# Patient Record
Sex: Female | Born: 1950 | Race: Black or African American | Hispanic: No | Smoking: Former smoker
Health system: Southern US, Community
[De-identification: ages and names within clinical notes are randomized; demographics above are authoritative.]

## PROBLEM LIST (undated history)

## (undated) DIAGNOSIS — G5621 Lesion of ulnar nerve, right upper limb: Secondary | ICD-10-CM

## (undated) DIAGNOSIS — H9190 Unspecified hearing loss, unspecified ear: Secondary | ICD-10-CM

## (undated) DIAGNOSIS — K219 Gastro-esophageal reflux disease without esophagitis: Secondary | ICD-10-CM

## (undated) DIAGNOSIS — C50919 Malignant neoplasm of unspecified site of unspecified female breast: Secondary | ICD-10-CM

## (undated) DIAGNOSIS — R0600 Dyspnea, unspecified: Secondary | ICD-10-CM

## (undated) DIAGNOSIS — K8020 Calculus of gallbladder without cholecystitis without obstruction: Secondary | ICD-10-CM

## (undated) DIAGNOSIS — R1310 Dysphagia, unspecified: Secondary | ICD-10-CM

## (undated) DIAGNOSIS — G5603 Carpal tunnel syndrome, bilateral upper limbs: Secondary | ICD-10-CM

## (undated) DIAGNOSIS — Z972 Presence of dental prosthetic device (complete) (partial): Secondary | ICD-10-CM

## (undated) DIAGNOSIS — R51 Headache: Secondary | ICD-10-CM

## (undated) DIAGNOSIS — J449 Chronic obstructive pulmonary disease, unspecified: Secondary | ICD-10-CM

## (undated) DIAGNOSIS — D509 Iron deficiency anemia, unspecified: Secondary | ICD-10-CM

## (undated) DIAGNOSIS — Z9989 Dependence on other enabling machines and devices: Secondary | ICD-10-CM

## (undated) DIAGNOSIS — E785 Hyperlipidemia, unspecified: Secondary | ICD-10-CM

## (undated) DIAGNOSIS — I219 Acute myocardial infarction, unspecified: Secondary | ICD-10-CM

## (undated) DIAGNOSIS — Z973 Presence of spectacles and contact lenses: Secondary | ICD-10-CM

## (undated) DIAGNOSIS — K760 Fatty (change of) liver, not elsewhere classified: Secondary | ICD-10-CM

## (undated) DIAGNOSIS — F1410 Cocaine abuse, uncomplicated: Secondary | ICD-10-CM

## (undated) DIAGNOSIS — C801 Malignant (primary) neoplasm, unspecified: Secondary | ICD-10-CM

## (undated) DIAGNOSIS — Z86010 Personal history of colonic polyps: Secondary | ICD-10-CM

## (undated) DIAGNOSIS — I209 Angina pectoris, unspecified: Secondary | ICD-10-CM

## (undated) DIAGNOSIS — I2510 Atherosclerotic heart disease of native coronary artery without angina pectoris: Secondary | ICD-10-CM

## (undated) DIAGNOSIS — R519 Headache, unspecified: Secondary | ICD-10-CM

## (undated) DIAGNOSIS — M1990 Unspecified osteoarthritis, unspecified site: Secondary | ICD-10-CM

## (undated) DIAGNOSIS — R112 Nausea with vomiting, unspecified: Secondary | ICD-10-CM

## (undated) DIAGNOSIS — I10 Essential (primary) hypertension: Secondary | ICD-10-CM

## (undated) HISTORY — DX: Acute myocardial infarction, unspecified: I21.9

## (undated) HISTORY — PX: EYE SURGERY: SHX253

## (undated) HISTORY — DX: Atherosclerotic heart disease of native coronary artery without angina pectoris: I25.10

## (undated) HISTORY — DX: Hyperlipidemia, unspecified: E78.5

## (undated) HISTORY — DX: Carpal tunnel syndrome, bilateral upper limbs: G56.03

## (undated) HISTORY — PX: BREAST BIOPSY: SHX20

## (undated) HISTORY — DX: Gastro-esophageal reflux disease without esophagitis: K21.9

## (undated) HISTORY — DX: Personal history of colonic polyps: Z86.010

## (undated) HISTORY — PX: CORONARY ANGIOPLASTY WITH STENT PLACEMENT: SHX49

## (undated) HISTORY — DX: Essential (primary) hypertension: I10

## (undated) HISTORY — PX: TUBAL LIGATION: SHX77

## (undated) HISTORY — DX: Calculus of gallbladder without cholecystitis without obstruction: K80.20

## (undated) HISTORY — DX: Fatty (change of) liver, not elsewhere classified: K76.0

## (undated) HISTORY — DX: Cocaine abuse, uncomplicated: F14.10

## (undated) HISTORY — DX: Lesion of ulnar nerve, right upper limb: G56.21

## (undated) HISTORY — DX: Iron deficiency anemia, unspecified: D50.9

## (undated) HISTORY — PX: CATARACT EXTRACTION: SUR2

---

## 1997-06-09 ENCOUNTER — Other Ambulatory Visit: Admission: RE | Admit: 1997-06-09 | Discharge: 1997-06-09 | Payer: Self-pay | Admitting: Family Medicine

## 1997-06-17 ENCOUNTER — Ambulatory Visit (HOSPITAL_COMMUNITY): Admission: RE | Admit: 1997-06-17 | Discharge: 1997-06-17 | Payer: Self-pay | Admitting: Family Medicine

## 1997-08-04 ENCOUNTER — Other Ambulatory Visit: Admission: RE | Admit: 1997-08-04 | Discharge: 1997-08-04 | Payer: Self-pay | Admitting: Family Medicine

## 1999-03-01 ENCOUNTER — Other Ambulatory Visit: Admission: RE | Admit: 1999-03-01 | Discharge: 1999-03-01 | Payer: Self-pay | Admitting: Internal Medicine

## 1999-03-10 ENCOUNTER — Ambulatory Visit (HOSPITAL_COMMUNITY): Admission: RE | Admit: 1999-03-10 | Discharge: 1999-03-10 | Payer: Self-pay | Admitting: *Deleted

## 2001-12-29 ENCOUNTER — Encounter: Payer: Self-pay | Admitting: Cardiology

## 2001-12-29 ENCOUNTER — Ambulatory Visit (HOSPITAL_COMMUNITY): Admission: RE | Admit: 2001-12-29 | Discharge: 2001-12-29 | Payer: Self-pay | Admitting: Cardiology

## 2002-07-23 ENCOUNTER — Ambulatory Visit (HOSPITAL_COMMUNITY): Admission: RE | Admit: 2002-07-23 | Discharge: 2002-07-24 | Payer: Self-pay | Admitting: Cardiology

## 2004-01-11 ENCOUNTER — Inpatient Hospital Stay (HOSPITAL_BASED_OUTPATIENT_CLINIC_OR_DEPARTMENT_OTHER): Admission: RE | Admit: 2004-01-11 | Discharge: 2004-01-11 | Payer: Self-pay | Admitting: Cardiology

## 2004-10-23 ENCOUNTER — Ambulatory Visit (HOSPITAL_COMMUNITY): Admission: RE | Admit: 2004-10-23 | Discharge: 2004-10-23 | Payer: Self-pay | Admitting: Cardiology

## 2004-12-02 ENCOUNTER — Emergency Department (HOSPITAL_COMMUNITY): Admission: EM | Admit: 2004-12-02 | Discharge: 2004-12-02 | Payer: Self-pay | Admitting: Family Medicine

## 2005-02-25 ENCOUNTER — Inpatient Hospital Stay (HOSPITAL_COMMUNITY): Admission: EM | Admit: 2005-02-25 | Discharge: 2005-03-01 | Payer: Self-pay | Admitting: Emergency Medicine

## 2005-03-09 ENCOUNTER — Emergency Department (HOSPITAL_COMMUNITY): Admission: EM | Admit: 2005-03-09 | Discharge: 2005-03-09 | Payer: Self-pay | Admitting: Emergency Medicine

## 2005-03-27 ENCOUNTER — Emergency Department (HOSPITAL_COMMUNITY): Admission: EM | Admit: 2005-03-27 | Discharge: 2005-03-27 | Payer: Self-pay | Admitting: Emergency Medicine

## 2006-02-20 ENCOUNTER — Ambulatory Visit (HOSPITAL_COMMUNITY): Admission: RE | Admit: 2006-02-20 | Discharge: 2006-02-20 | Payer: Self-pay | Admitting: Cardiology

## 2006-04-30 ENCOUNTER — Emergency Department (HOSPITAL_COMMUNITY): Admission: EM | Admit: 2006-04-30 | Discharge: 2006-04-30 | Payer: Self-pay | Admitting: Emergency Medicine

## 2006-05-06 ENCOUNTER — Inpatient Hospital Stay (HOSPITAL_COMMUNITY): Admission: EM | Admit: 2006-05-06 | Discharge: 2006-05-09 | Payer: Self-pay | Admitting: Emergency Medicine

## 2006-09-12 ENCOUNTER — Encounter (HOSPITAL_COMMUNITY): Admission: RE | Admit: 2006-09-12 | Discharge: 2006-12-11 | Payer: Self-pay | Admitting: Cardiology

## 2006-09-16 ENCOUNTER — Inpatient Hospital Stay (HOSPITAL_COMMUNITY): Admission: EM | Admit: 2006-09-16 | Discharge: 2006-09-19 | Payer: Self-pay | Admitting: Emergency Medicine

## 2006-12-12 ENCOUNTER — Encounter (HOSPITAL_COMMUNITY): Admission: RE | Admit: 2006-12-12 | Discharge: 2007-01-06 | Payer: Self-pay | Admitting: Cardiology

## 2007-01-07 ENCOUNTER — Encounter (HOSPITAL_COMMUNITY): Admission: RE | Admit: 2007-01-07 | Discharge: 2007-02-06 | Payer: Self-pay | Admitting: Cardiology

## 2007-02-06 ENCOUNTER — Encounter (HOSPITAL_COMMUNITY): Admission: RE | Admit: 2007-02-06 | Discharge: 2007-03-07 | Payer: Self-pay | Admitting: Cardiology

## 2007-02-06 DIAGNOSIS — K760 Fatty (change of) liver, not elsewhere classified: Secondary | ICD-10-CM

## 2007-02-06 DIAGNOSIS — Z860100 Personal history of colon polyps, unspecified: Secondary | ICD-10-CM

## 2007-02-06 HISTORY — DX: Personal history of colonic polyps: Z86.010

## 2007-02-06 HISTORY — DX: Personal history of colon polyps, unspecified: Z86.0100

## 2007-02-06 HISTORY — DX: Fatty (change of) liver, not elsewhere classified: K76.0

## 2007-02-06 HISTORY — PX: RIGHT COLECTOMY: SHX853

## 2007-02-17 ENCOUNTER — Inpatient Hospital Stay (HOSPITAL_COMMUNITY): Admission: EM | Admit: 2007-02-17 | Discharge: 2007-03-14 | Payer: Self-pay | Admitting: Emergency Medicine

## 2007-02-18 ENCOUNTER — Encounter: Payer: Self-pay | Admitting: Internal Medicine

## 2007-02-20 ENCOUNTER — Encounter (INDEPENDENT_AMBULATORY_CARE_PROVIDER_SITE_OTHER): Payer: Self-pay | Admitting: Gastroenterology

## 2007-02-20 ENCOUNTER — Encounter (INDEPENDENT_AMBULATORY_CARE_PROVIDER_SITE_OTHER): Payer: Self-pay | Admitting: *Deleted

## 2007-02-20 ENCOUNTER — Encounter: Payer: Self-pay | Admitting: Internal Medicine

## 2007-02-22 ENCOUNTER — Encounter (INDEPENDENT_AMBULATORY_CARE_PROVIDER_SITE_OTHER): Payer: Self-pay | Admitting: *Deleted

## 2007-02-22 ENCOUNTER — Encounter (INDEPENDENT_AMBULATORY_CARE_PROVIDER_SITE_OTHER): Payer: Self-pay | Admitting: General Surgery

## 2007-03-03 ENCOUNTER — Ambulatory Visit: Payer: Self-pay | Admitting: Gastroenterology

## 2007-03-05 ENCOUNTER — Encounter (INDEPENDENT_AMBULATORY_CARE_PROVIDER_SITE_OTHER): Payer: Self-pay | Admitting: *Deleted

## 2007-03-07 ENCOUNTER — Encounter (INDEPENDENT_AMBULATORY_CARE_PROVIDER_SITE_OTHER): Payer: Self-pay | Admitting: *Deleted

## 2007-03-08 ENCOUNTER — Encounter (INDEPENDENT_AMBULATORY_CARE_PROVIDER_SITE_OTHER): Payer: Self-pay | Admitting: *Deleted

## 2007-03-10 ENCOUNTER — Encounter (INDEPENDENT_AMBULATORY_CARE_PROVIDER_SITE_OTHER): Payer: Self-pay | Admitting: *Deleted

## 2007-05-28 ENCOUNTER — Emergency Department (HOSPITAL_COMMUNITY): Admission: EM | Admit: 2007-05-28 | Discharge: 2007-05-28 | Payer: Self-pay | Admitting: Emergency Medicine

## 2007-06-01 ENCOUNTER — Inpatient Hospital Stay (HOSPITAL_COMMUNITY): Admission: EM | Admit: 2007-06-01 | Discharge: 2007-06-04 | Payer: Self-pay | Admitting: Emergency Medicine

## 2007-06-19 ENCOUNTER — Encounter (HOSPITAL_COMMUNITY): Admission: RE | Admit: 2007-06-19 | Discharge: 2007-09-17 | Payer: Self-pay | Admitting: Cardiology

## 2007-08-26 ENCOUNTER — Inpatient Hospital Stay (HOSPITAL_COMMUNITY): Admission: EM | Admit: 2007-08-26 | Discharge: 2007-08-29 | Payer: Self-pay | Admitting: Emergency Medicine

## 2007-11-06 IMAGING — CR DG CHEST 1V PORT
1 series · 1 of 1 positions shown · non-contrast
Comparison: None.

CLINICAL DATA: Chest pain.
 PORTABLE CHEST - 1 VIEW:

[view not recorded]
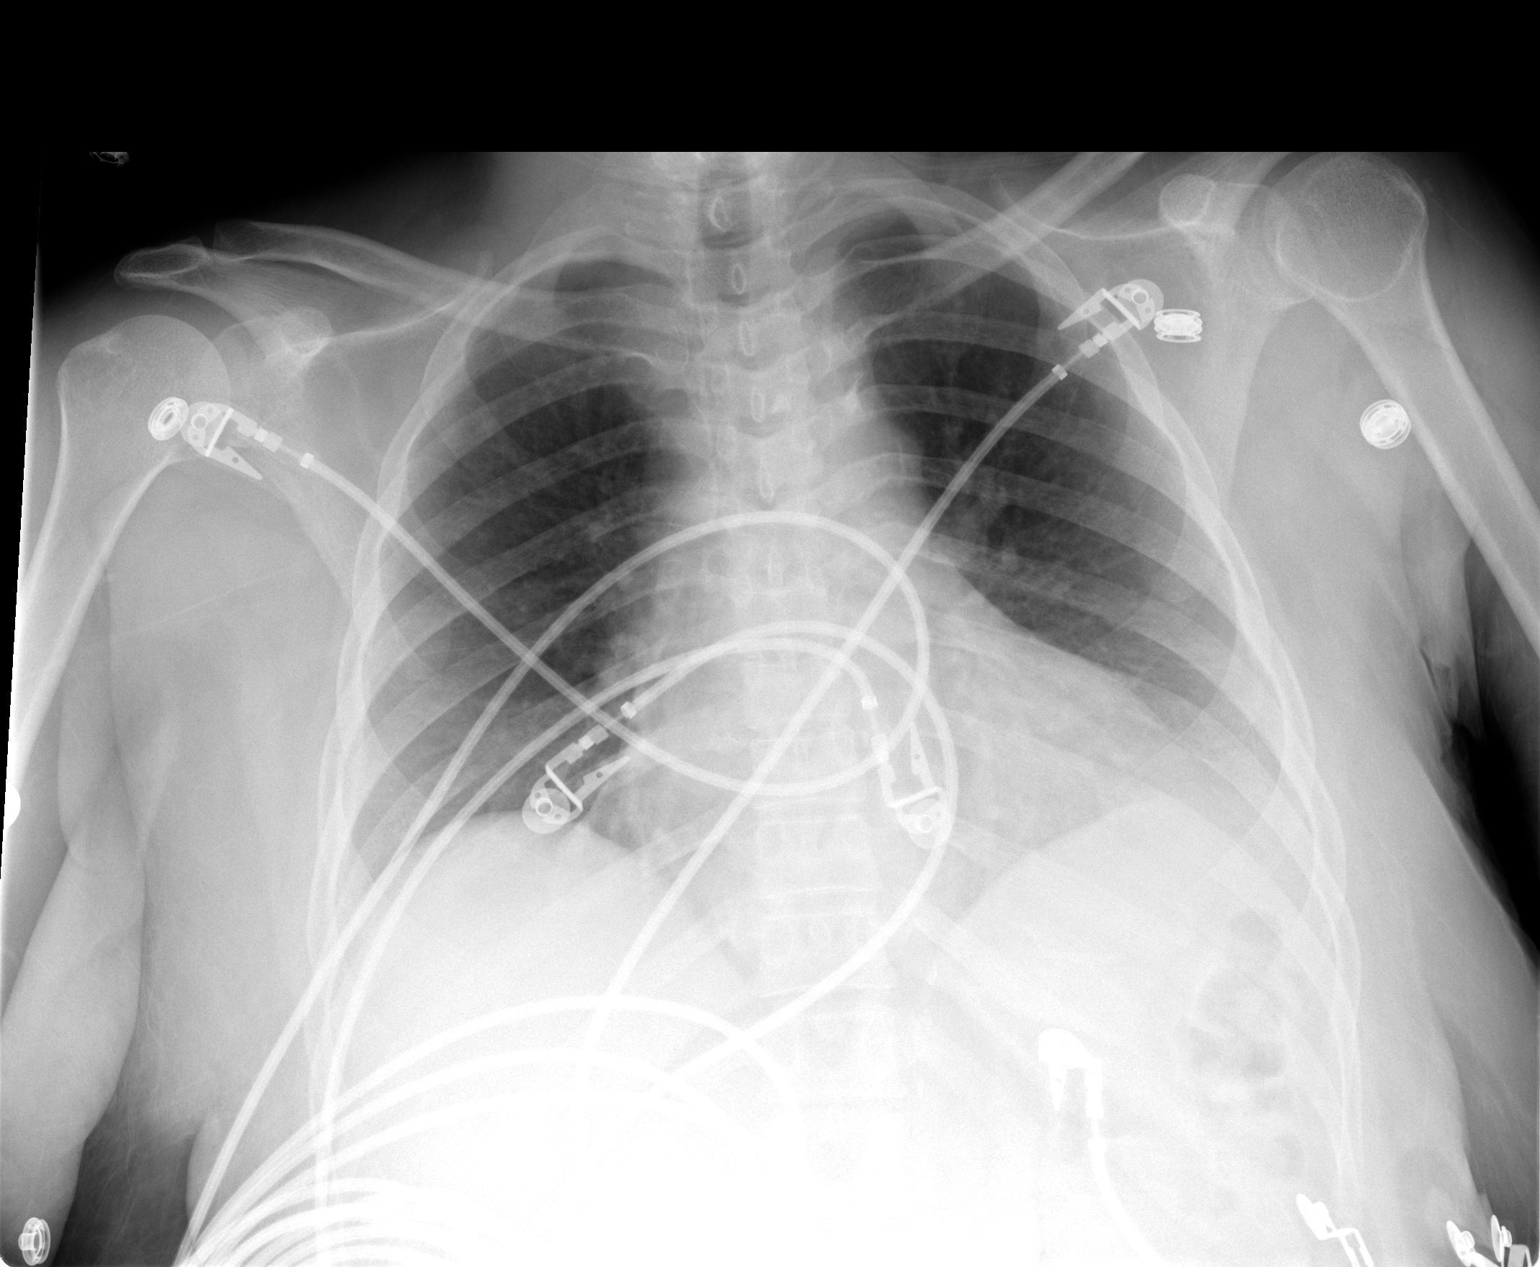

[1 of 1 positions shown; findings below may reference images not displayed]

AP film at 3299 hours shows low volumes with cardiomegaly.  No edema.  Left hemidiaphragm appears obscured laterally, raising the question of atelectasis or infiltrate.  Telemetry leads overlie the chest.
IMPRESSION: 1.  Cardiomegaly without edema.
 2.  Probable atelectasis or infiltrate at the left base.  Two-view chest x-ray would be helpful to further evaluate as indicated.

## 2007-11-06 IMAGING — CR DG CHEST 2V
2 series · 2 of 2 positions shown · non-contrast
Comparison: 02/25/05.

CLINICAL DATA: Chest pain, short of breath. 
 CHEST ? 2 VIEW:

[w chest pa]
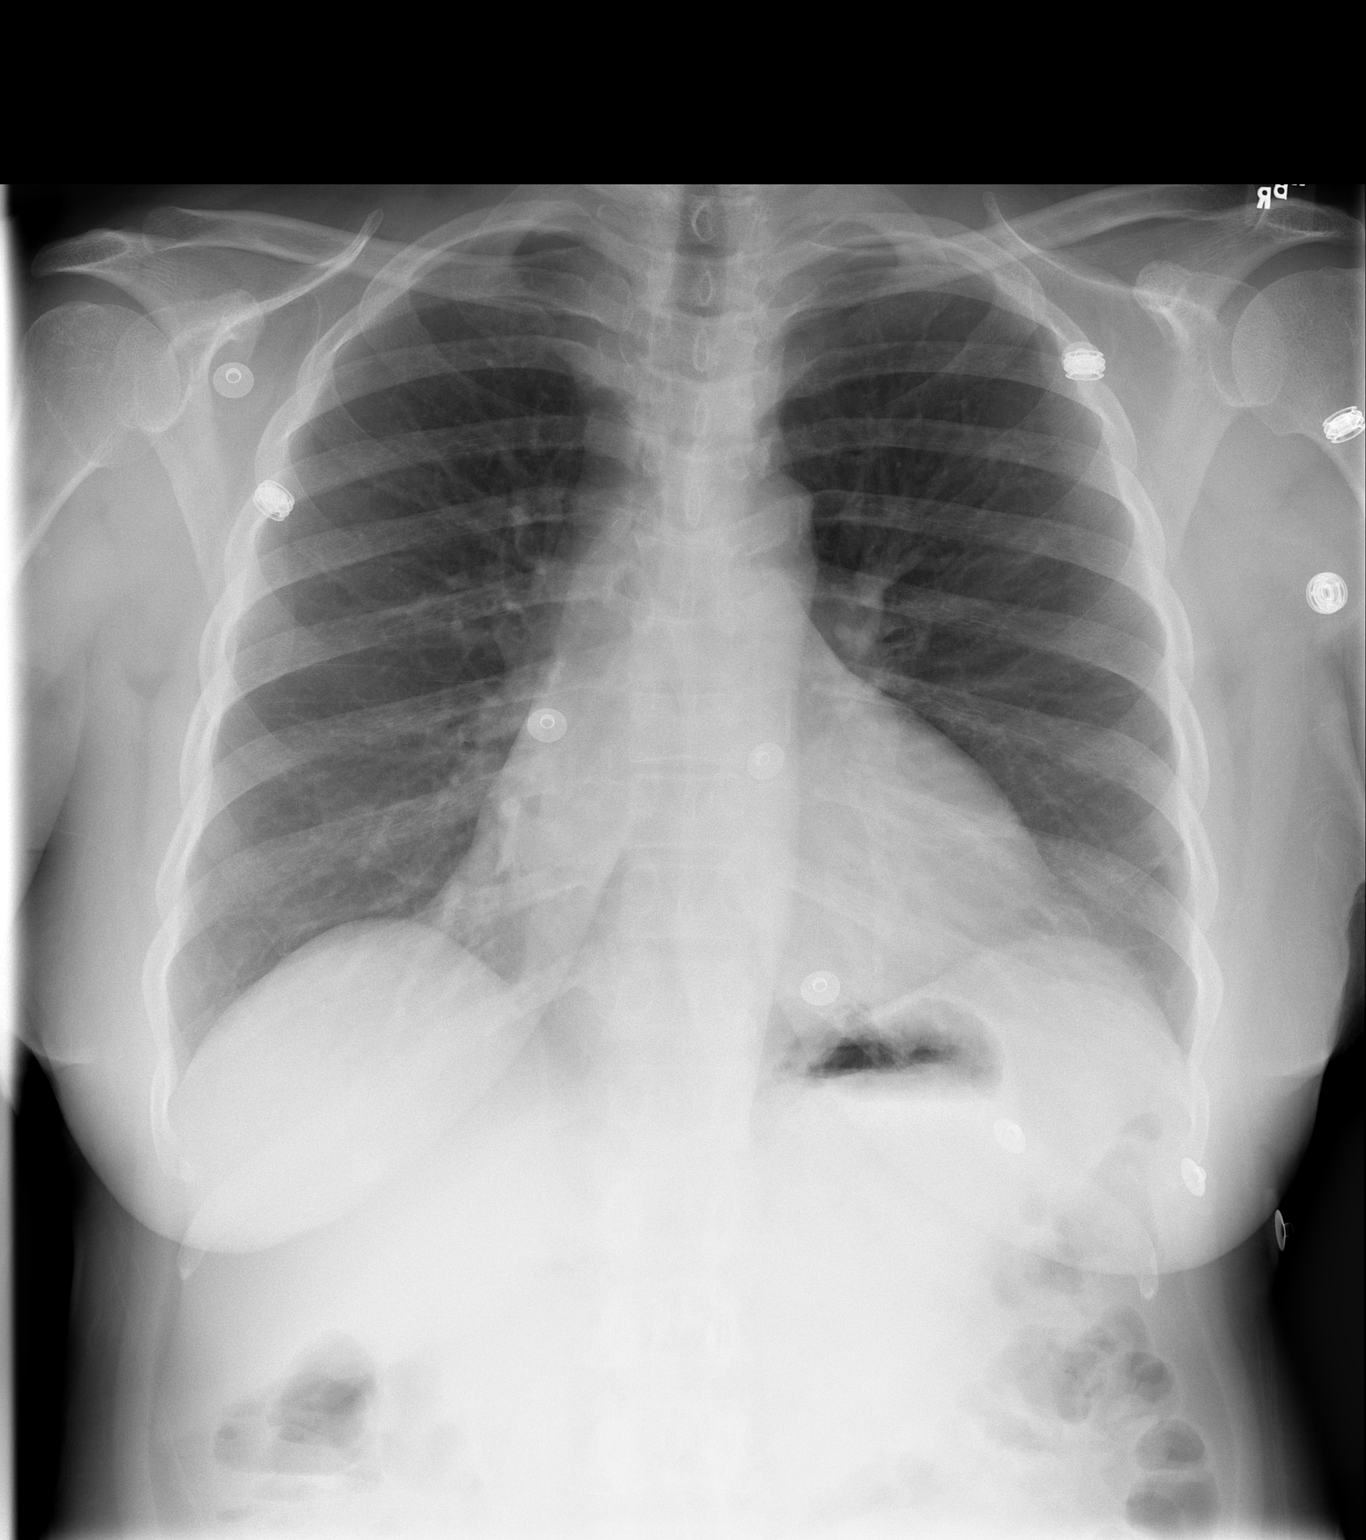

[w chest lat]
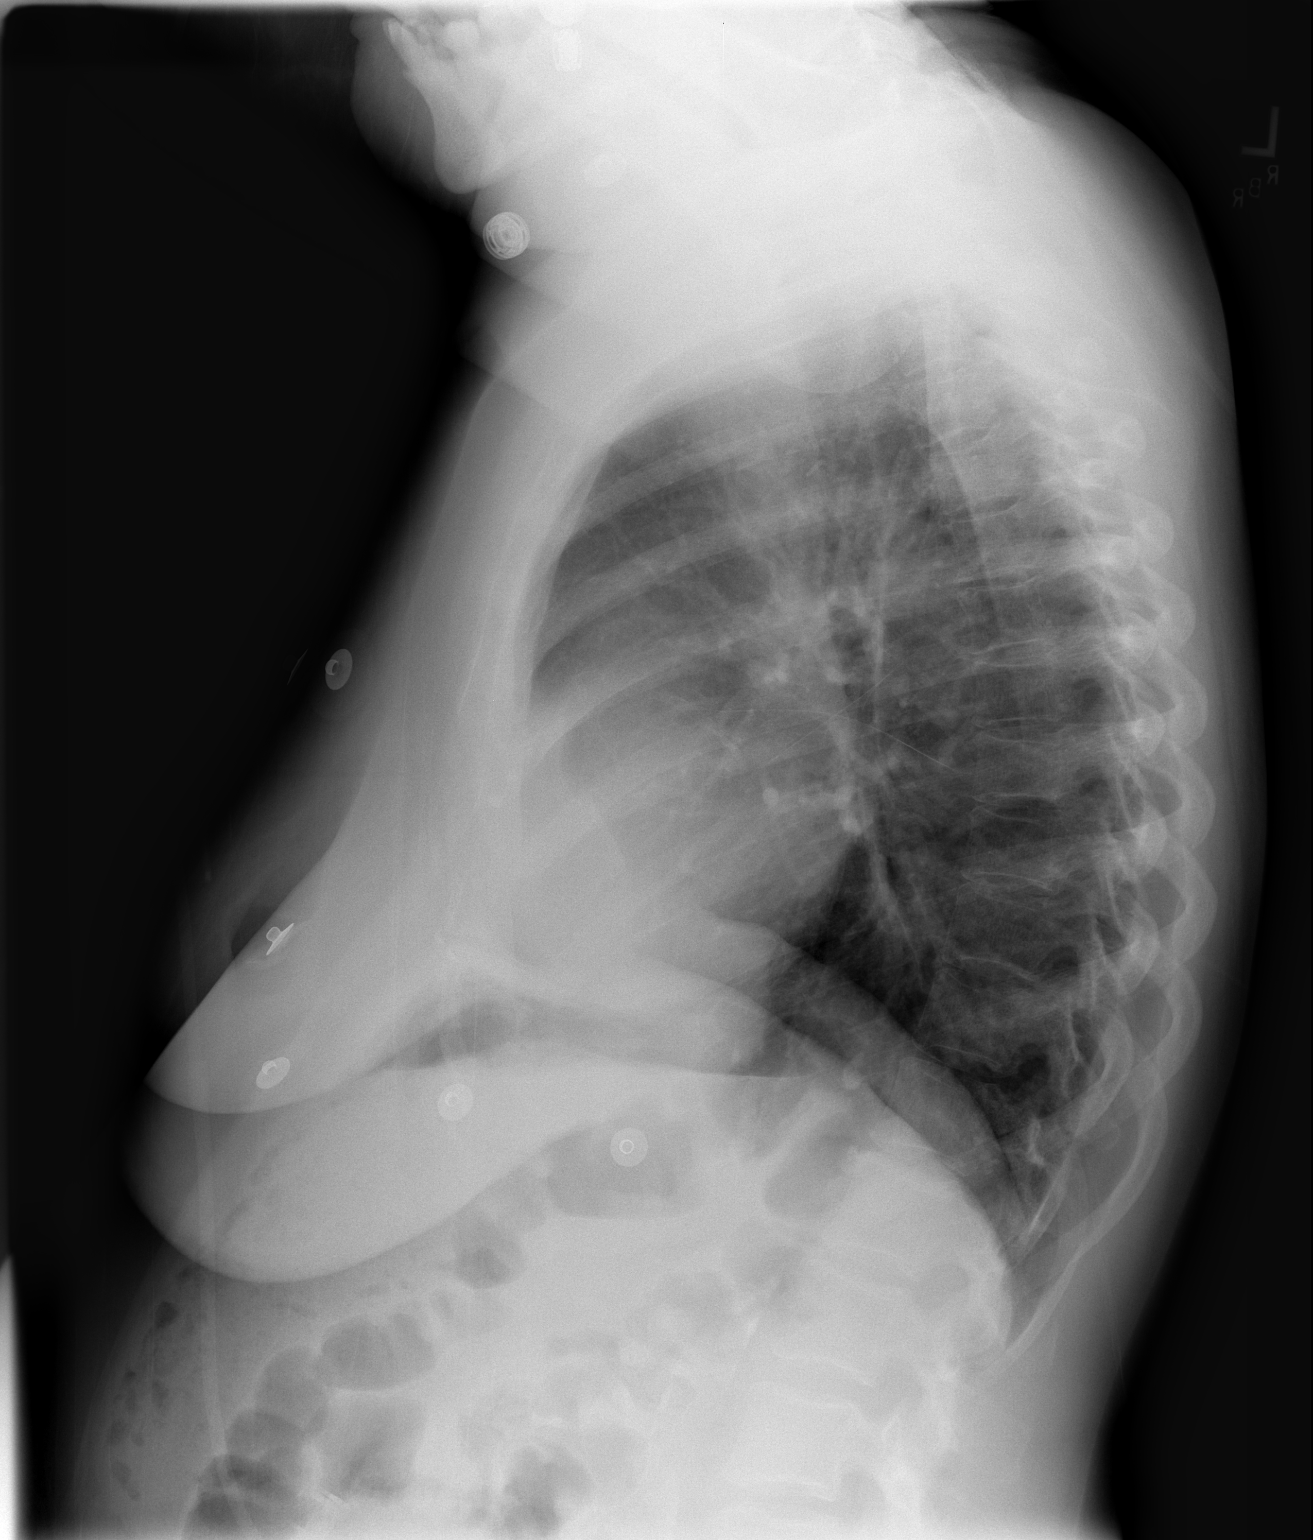

[2 of 2 positions shown; findings below may reference images not displayed]

FINDINGS: Two views of the chest show stable cardiomegaly.  The opacity at the left base has cleared.  No active process is seen.  No bony abnormality is noted.
IMPRESSION: No active lung disease.  Cardiomegaly.

## 2007-11-17 ENCOUNTER — Ambulatory Visit: Payer: Self-pay | Admitting: Internal Medicine

## 2008-01-15 ENCOUNTER — Ambulatory Visit: Payer: Self-pay | Admitting: Internal Medicine

## 2008-01-16 ENCOUNTER — Ambulatory Visit (HOSPITAL_COMMUNITY): Admission: RE | Admit: 2008-01-16 | Discharge: 2008-01-16 | Payer: Self-pay | Admitting: Internal Medicine

## 2008-02-04 ENCOUNTER — Encounter (HOSPITAL_COMMUNITY): Admission: RE | Admit: 2008-02-04 | Discharge: 2008-02-05 | Payer: Self-pay | Admitting: Cardiology

## 2008-02-06 ENCOUNTER — Encounter (HOSPITAL_COMMUNITY): Admission: RE | Admit: 2008-02-06 | Discharge: 2008-02-06 | Payer: Self-pay | Admitting: *Deleted

## 2008-09-24 ENCOUNTER — Encounter (INDEPENDENT_AMBULATORY_CARE_PROVIDER_SITE_OTHER): Payer: Self-pay | Admitting: Adult Health

## 2008-09-24 ENCOUNTER — Ambulatory Visit: Payer: Self-pay | Admitting: Internal Medicine

## 2008-09-24 LAB — CONVERTED CEMR LAB
ALT: 71 units/L — ABNORMAL HIGH (ref 0–35)
BUN: 8 mg/dL (ref 6–23)
Basophils Relative: 0 % (ref 0–1)
CO2: 25 meq/L (ref 19–32)
Calcium: 9.6 mg/dL (ref 8.4–10.5)
Cholesterol: 111 mg/dL (ref 0–200)
Creatinine, Ser: 1.01 mg/dL (ref 0.40–1.20)
Eosinophils Absolute: 0.1 10*3/uL (ref 0.0–0.7)
Eosinophils Relative: 2 % (ref 0–5)
Glucose, Bld: 108 mg/dL — ABNORMAL HIGH (ref 70–99)
HCT: 38.6 % (ref 36.0–46.0)
HDL: 37 mg/dL — ABNORMAL LOW (ref >39)
MCHC: 30.3 g/dL (ref 30.0–36.0)
MCV: 96.5 fL (ref 78.0–100.0)
Monocytes Relative: 11 % (ref 3–12)
Neutrophils Relative %: 48 % (ref 43–77)
RBC: 4 M/uL (ref 3.87–5.11)
Total Bilirubin: 0.5 mg/dL (ref 0.3–1.2)
Total CHOL/HDL Ratio: 3
Triglycerides: 319 mg/dL — ABNORMAL HIGH (ref <150)
VLDL: 64 mg/dL — ABNORMAL HIGH (ref 0–40)

## 2008-09-28 ENCOUNTER — Encounter (INDEPENDENT_AMBULATORY_CARE_PROVIDER_SITE_OTHER): Payer: Self-pay | Admitting: Adult Health

## 2008-09-28 LAB — CONVERTED CEMR LAB
HCV Ab: NEGATIVE
Hep A IgM: NEGATIVE

## 2008-10-13 ENCOUNTER — Ambulatory Visit (HOSPITAL_COMMUNITY): Admission: RE | Admit: 2008-10-13 | Discharge: 2008-10-13 | Payer: Self-pay | Admitting: Internal Medicine

## 2008-10-13 ENCOUNTER — Ambulatory Visit (HOSPITAL_COMMUNITY): Admission: RE | Admit: 2008-10-13 | Discharge: 2008-10-13 | Payer: Self-pay | Admitting: Adult Health

## 2008-10-16 ENCOUNTER — Inpatient Hospital Stay (HOSPITAL_COMMUNITY): Admission: EM | Admit: 2008-10-16 | Discharge: 2008-10-19 | Payer: Self-pay | Admitting: Emergency Medicine

## 2008-10-19 ENCOUNTER — Encounter (INDEPENDENT_AMBULATORY_CARE_PROVIDER_SITE_OTHER): Payer: Self-pay | Admitting: *Deleted

## 2008-10-25 ENCOUNTER — Encounter (INDEPENDENT_AMBULATORY_CARE_PROVIDER_SITE_OTHER): Payer: Self-pay | Admitting: Adult Health

## 2008-10-25 ENCOUNTER — Ambulatory Visit: Payer: Self-pay | Admitting: Internal Medicine

## 2008-10-25 LAB — CONVERTED CEMR LAB
ALT: 16 units/L (ref 0–35)
AST: 13 units/L (ref 0–37)
CO2: 25 meq/L (ref 19–32)
Calcium: 9.3 mg/dL (ref 8.4–10.5)
Chloride: 107 meq/L (ref 96–112)
Potassium: 3.7 meq/L (ref 3.5–5.3)
Sodium: 144 meq/L (ref 135–145)
Total Protein: 7.9 g/dL (ref 6.0–8.3)

## 2008-11-25 ENCOUNTER — Ambulatory Visit: Payer: Self-pay | Admitting: Internal Medicine

## 2009-01-08 IMAGING — CR DG CHEST 1V PORT
1 series · 1 of 1 positions shown · non-contrast
Comparison: 02/25/05.

CLINICAL DATA: Short of breath. Chest pain.  
 PORTABLE CHEST - 1 VIEW, 04/30/06, 5334 HOURS:

[view not recorded]
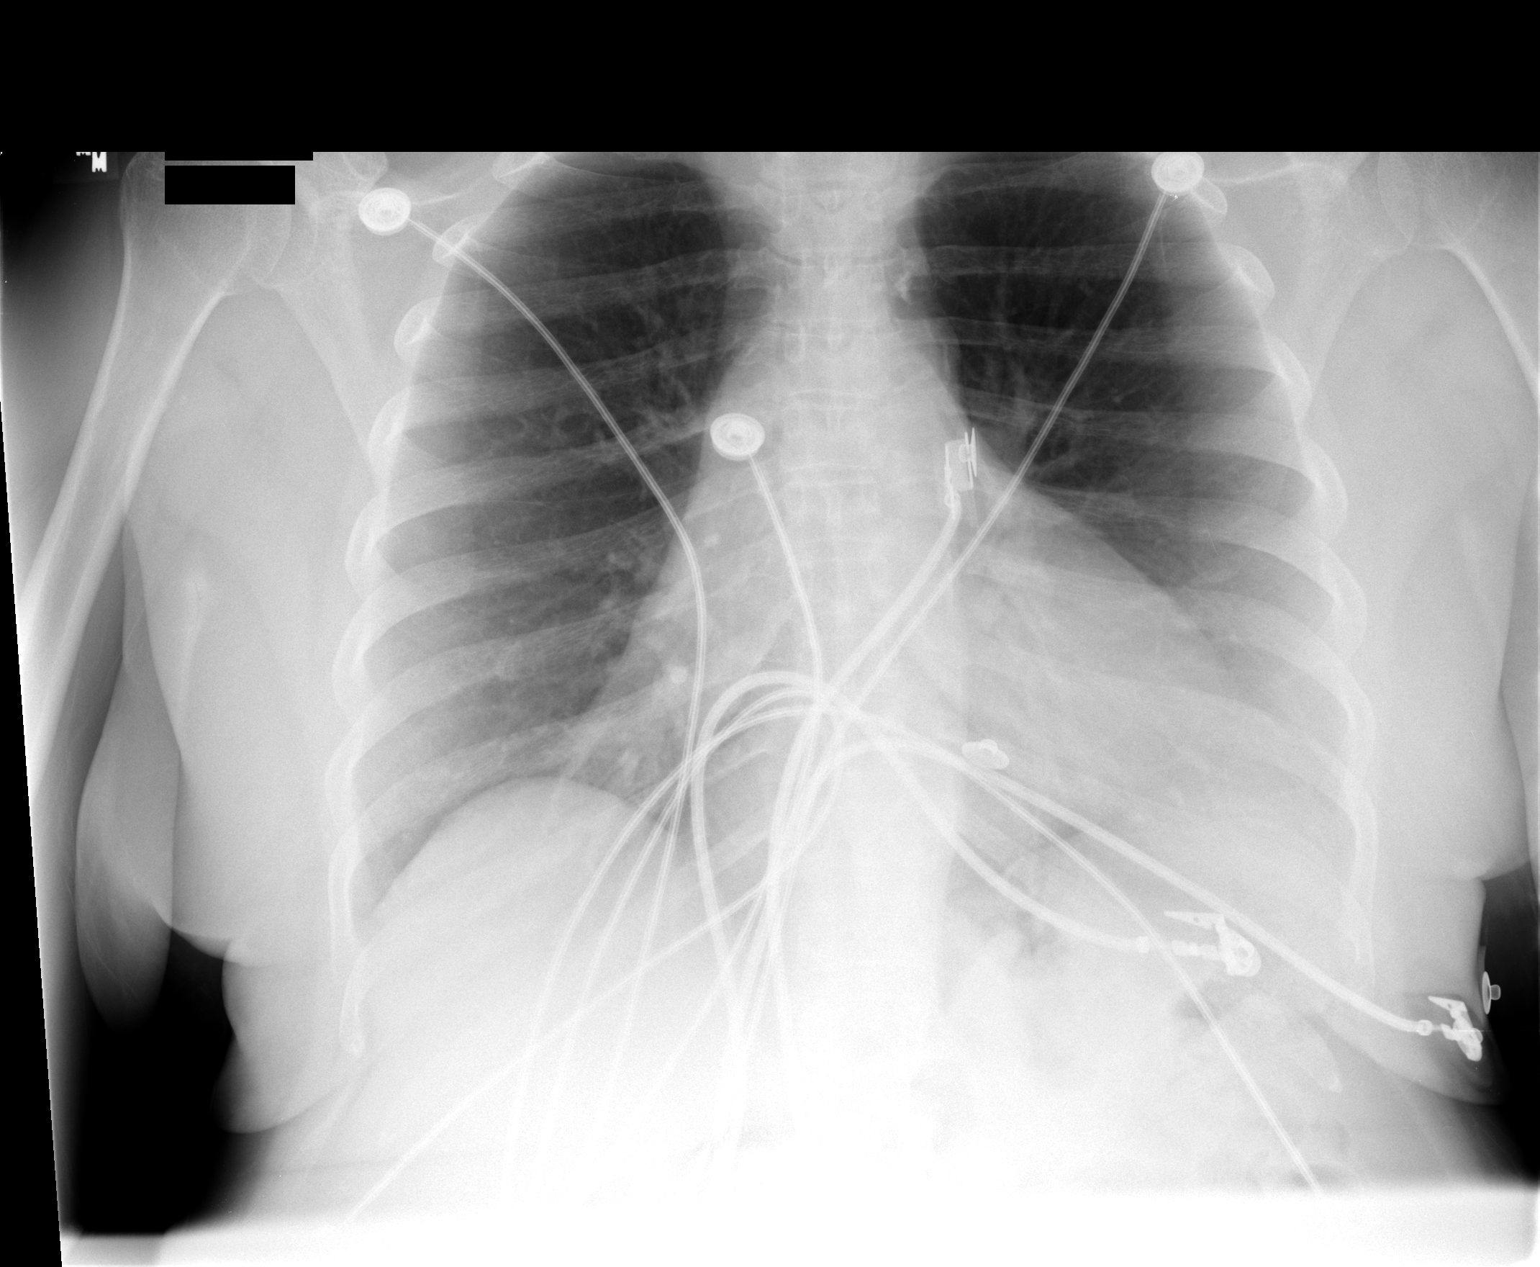

[1 of 1 positions shown; findings below may reference images not displayed]

FINDINGS: Heart is mildly enlarged.  Vascularity is normal.  Lungs are clear.
IMPRESSION: No acute abnormality.

## 2009-01-14 IMAGING — CR DG CHEST 1V PORT
1 series · 1 of 1 positions shown · non-contrast
Comparison: 04/30/06.

CLINICAL DATA: Chest pain.  SOB.  Smoker. 
 PORTABLE CHEST ? 1 VIEW:

[view not recorded]
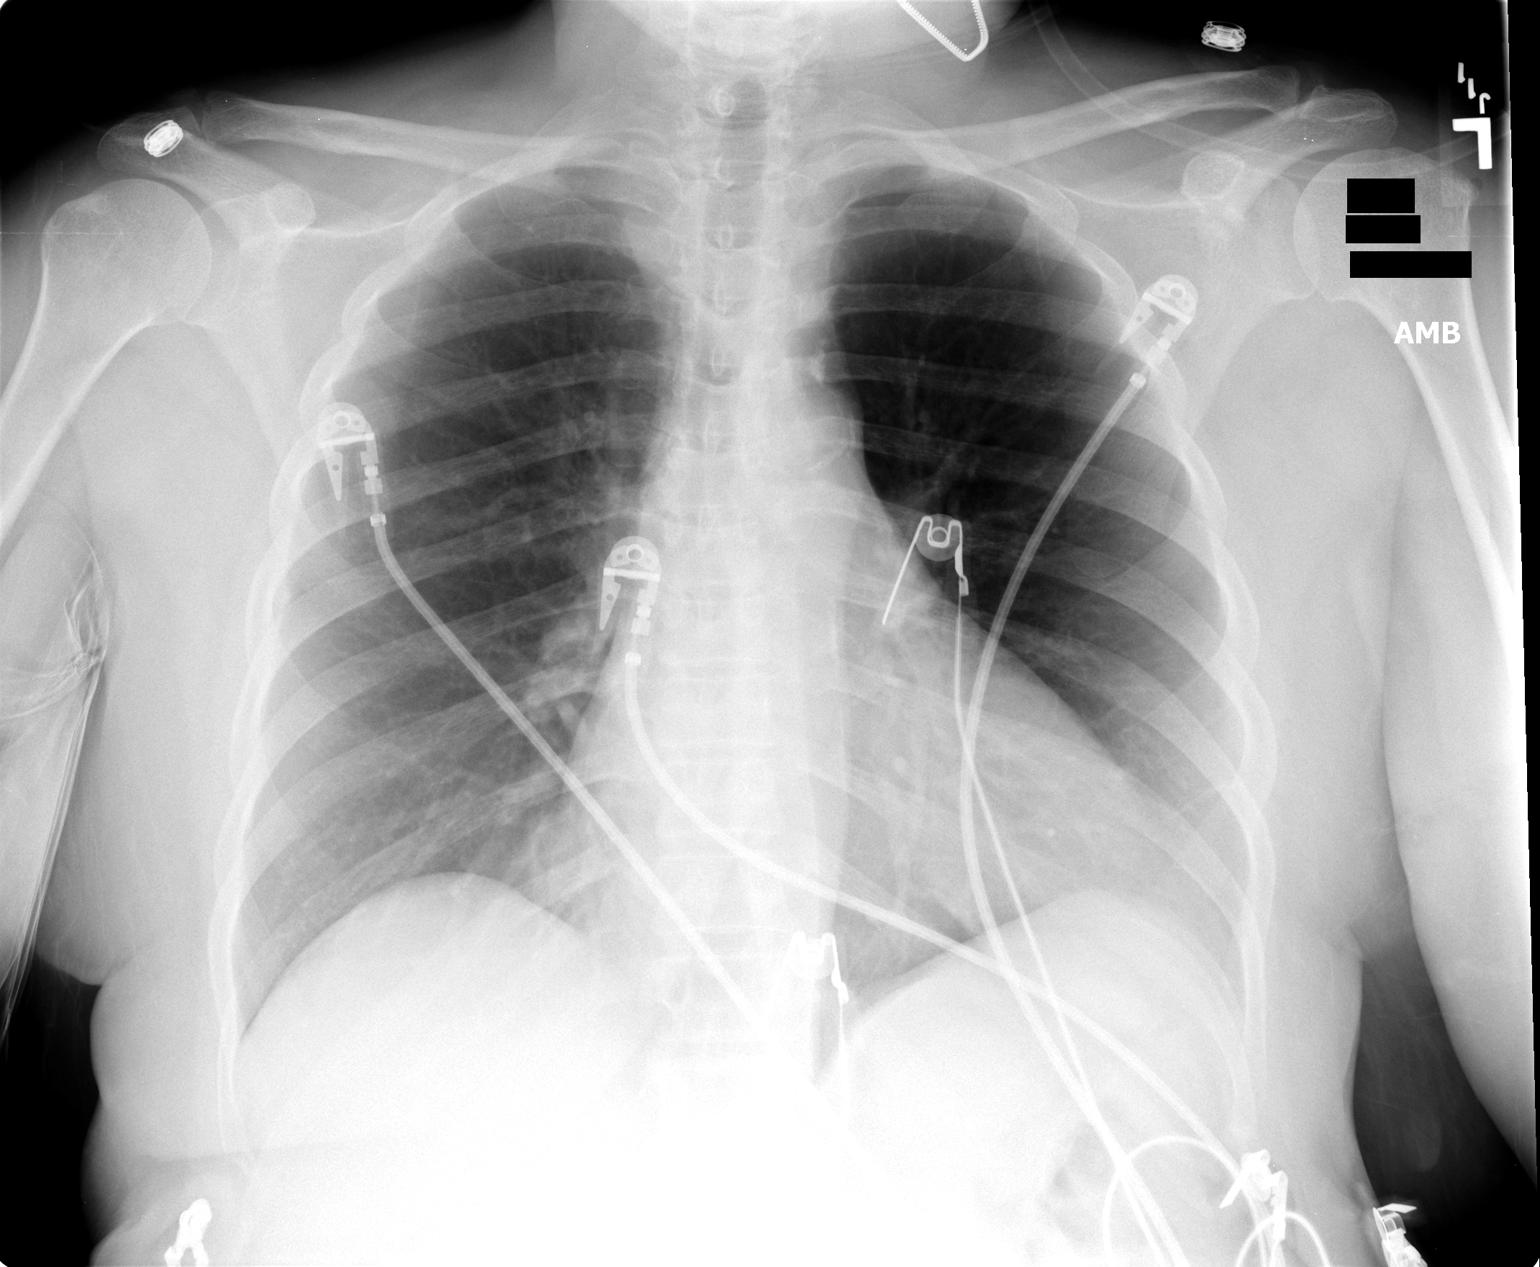

[1 of 1 positions shown; findings below may reference images not displayed]

FINDINGS: Heart size normal.  No effusions or edema.  No airspace opacities are identified.
IMPRESSION: No active cardiopulmonary disease.

## 2009-02-13 ENCOUNTER — Emergency Department (HOSPITAL_COMMUNITY): Admission: EM | Admit: 2009-02-13 | Discharge: 2009-02-13 | Payer: Self-pay | Admitting: Emergency Medicine

## 2009-02-22 ENCOUNTER — Other Ambulatory Visit: Admission: RE | Admit: 2009-02-22 | Discharge: 2009-02-22 | Payer: Self-pay | Admitting: Family Medicine

## 2009-02-22 ENCOUNTER — Ambulatory Visit: Payer: Self-pay | Admitting: Internal Medicine

## 2009-02-24 ENCOUNTER — Encounter (INDEPENDENT_AMBULATORY_CARE_PROVIDER_SITE_OTHER): Payer: Self-pay | Admitting: *Deleted

## 2009-03-02 ENCOUNTER — Encounter (INDEPENDENT_AMBULATORY_CARE_PROVIDER_SITE_OTHER): Payer: Self-pay | Admitting: *Deleted

## 2009-03-02 ENCOUNTER — Ambulatory Visit: Payer: Self-pay | Admitting: Internal Medicine

## 2009-03-22 ENCOUNTER — Ambulatory Visit: Payer: Self-pay | Admitting: Internal Medicine

## 2009-03-23 ENCOUNTER — Encounter (INDEPENDENT_AMBULATORY_CARE_PROVIDER_SITE_OTHER): Payer: Self-pay | Admitting: Adult Health

## 2009-03-23 LAB — CONVERTED CEMR LAB
ALT: 16 units/L (ref 0–35)
Alkaline Phosphatase: 110 units/L (ref 39–117)
Cholesterol: 101 mg/dL (ref 0–200)
Creatinine, Ser: 0.93 mg/dL (ref 0.40–1.20)
LDL Cholesterol: 46 mg/dL (ref 0–99)
Sodium: 140 meq/L (ref 135–145)
Total Bilirubin: 0.4 mg/dL (ref 0.3–1.2)
Total CHOL/HDL Ratio: 2.9
Total Protein: 8 g/dL (ref 6.0–8.3)
Triglycerides: 99 mg/dL (ref <150)
VLDL: 20 mg/dL (ref 0–40)

## 2009-04-08 DIAGNOSIS — I219 Acute myocardial infarction, unspecified: Secondary | ICD-10-CM | POA: Insufficient documentation

## 2009-04-08 DIAGNOSIS — E119 Type 2 diabetes mellitus without complications: Secondary | ICD-10-CM

## 2009-04-08 DIAGNOSIS — Z860100 Personal history of colon polyps, unspecified: Secondary | ICD-10-CM | POA: Insufficient documentation

## 2009-04-08 DIAGNOSIS — I2510 Atherosclerotic heart disease of native coronary artery without angina pectoris: Secondary | ICD-10-CM | POA: Diagnosis present

## 2009-04-08 DIAGNOSIS — I10 Essential (primary) hypertension: Secondary | ICD-10-CM | POA: Insufficient documentation

## 2009-04-08 DIAGNOSIS — K7689 Other specified diseases of liver: Secondary | ICD-10-CM | POA: Insufficient documentation

## 2009-04-08 DIAGNOSIS — K219 Gastro-esophageal reflux disease without esophagitis: Secondary | ICD-10-CM | POA: Insufficient documentation

## 2009-04-08 DIAGNOSIS — E785 Hyperlipidemia, unspecified: Secondary | ICD-10-CM

## 2009-04-08 HISTORY — DX: Acute myocardial infarction, unspecified: I21.9

## 2009-04-13 ENCOUNTER — Ambulatory Visit: Payer: Self-pay | Admitting: Internal Medicine

## 2009-04-25 ENCOUNTER — Ambulatory Visit: Payer: Self-pay | Admitting: Internal Medicine

## 2009-04-26 ENCOUNTER — Encounter (INDEPENDENT_AMBULATORY_CARE_PROVIDER_SITE_OTHER): Payer: Self-pay | Admitting: *Deleted

## 2009-04-26 ENCOUNTER — Inpatient Hospital Stay (HOSPITAL_COMMUNITY): Admission: EM | Admit: 2009-04-26 | Discharge: 2009-04-29 | Payer: Self-pay | Admitting: Emergency Medicine

## 2009-04-26 ENCOUNTER — Telehealth (INDEPENDENT_AMBULATORY_CARE_PROVIDER_SITE_OTHER): Payer: Self-pay | Admitting: *Deleted

## 2009-04-26 ENCOUNTER — Encounter: Payer: Self-pay | Admitting: Internal Medicine

## 2009-04-28 ENCOUNTER — Encounter (INDEPENDENT_AMBULATORY_CARE_PROVIDER_SITE_OTHER): Payer: Self-pay | Admitting: *Deleted

## 2009-04-29 ENCOUNTER — Encounter (INDEPENDENT_AMBULATORY_CARE_PROVIDER_SITE_OTHER): Payer: Self-pay | Admitting: *Deleted

## 2009-05-18 ENCOUNTER — Encounter (INDEPENDENT_AMBULATORY_CARE_PROVIDER_SITE_OTHER): Payer: Self-pay | Admitting: Adult Health

## 2009-05-18 ENCOUNTER — Ambulatory Visit: Payer: Self-pay | Admitting: Family Medicine

## 2009-05-18 LAB — CONVERTED CEMR LAB
ALT: 10 units/L (ref 0–35)
AST: 13 units/L (ref 0–37)
Albumin: 4.6 g/dL (ref 3.5–5.2)
Alkaline Phosphatase: 101 units/L (ref 39–117)
Basophils Absolute: 0 10*3/uL (ref 0.0–0.1)
Basophils Relative: 1 % (ref 0–1)
Hemoglobin: 11 g/dL — ABNORMAL LOW (ref 12.0–15.0)
Lymphocytes Relative: 40 % (ref 12–46)
MCHC: 29.5 g/dL — ABNORMAL LOW (ref 30.0–36.0)
Monocytes Absolute: 0.8 10*3/uL (ref 0.1–1.0)
Neutro Abs: 3 10*3/uL (ref 1.7–7.7)
Neutrophils Relative %: 45 % (ref 43–77)
Potassium: 4.8 meq/L (ref 3.5–5.3)
RDW: 17.5 % — ABNORMAL HIGH (ref 11.5–15.5)
Sodium: 140 meq/L (ref 135–145)
Total Bilirubin: 0.4 mg/dL (ref 0.3–1.2)
Total Protein: 8.6 g/dL — ABNORMAL HIGH (ref 6.0–8.3)

## 2009-05-27 IMAGING — CR DG CHEST 1V PORT
1 series · 1 of 1 positions shown · non-contrast
Comparison: 05/06/2006.

CLINICAL DATA: Chest pain

PORTABLE CHEST - 1 VIEW
TECHNIQUE: Portable sitting AP chest.

[view not recorded]
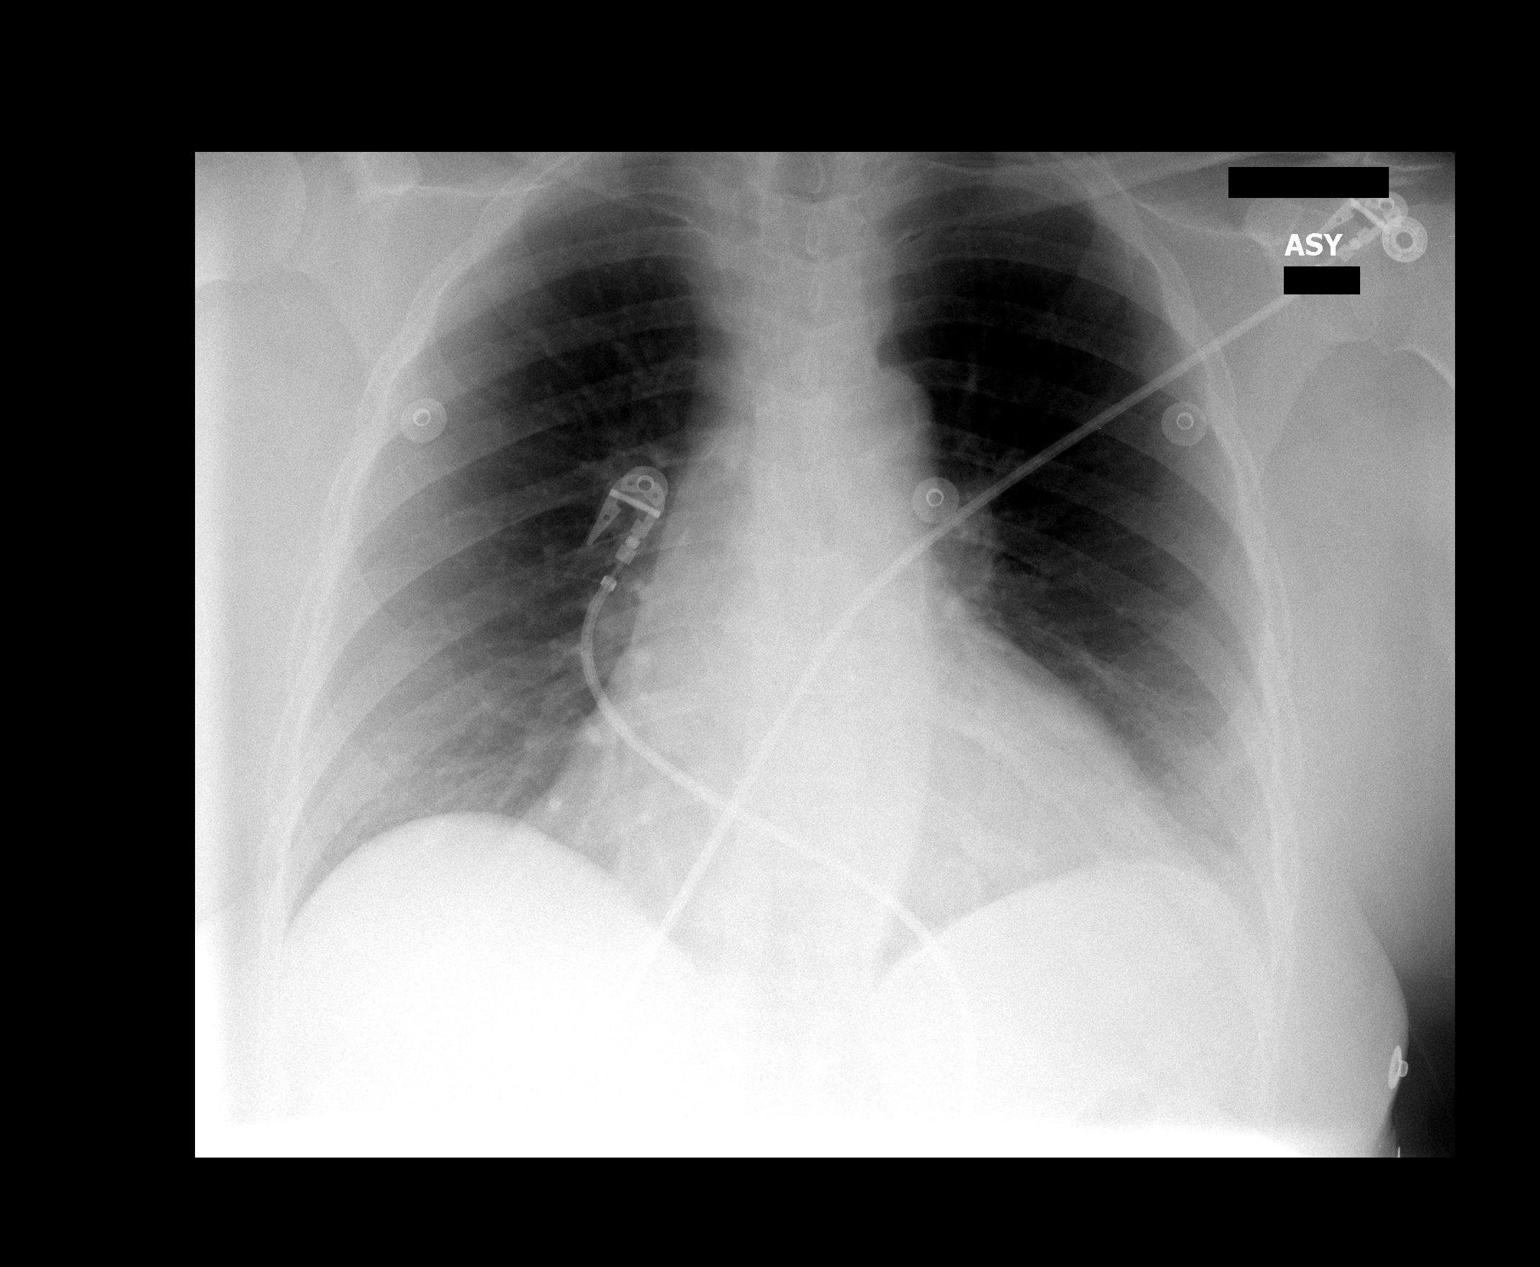

[1 of 1 positions shown; findings below may reference images not displayed]

FINDINGS: Heart size upper limits of normal for projection. No edema,
effusions, or airspace disease. Cardiomediastinal contours within normal limits.

IMPRESSION

No active cardiopulmonary disease.

## 2009-06-13 ENCOUNTER — Ambulatory Visit: Payer: Self-pay | Admitting: Internal Medicine

## 2009-08-23 ENCOUNTER — Ambulatory Visit: Payer: Self-pay | Admitting: Internal Medicine

## 2009-08-23 ENCOUNTER — Inpatient Hospital Stay (HOSPITAL_COMMUNITY): Admission: EM | Admit: 2009-08-23 | Discharge: 2009-08-26 | Payer: Self-pay | Admitting: Emergency Medicine

## 2009-10-14 ENCOUNTER — Ambulatory Visit (HOSPITAL_COMMUNITY)
Admission: RE | Admit: 2009-10-14 | Discharge: 2009-10-14 | Payer: Self-pay | Source: Home / Self Care | Admitting: Family Medicine

## 2009-10-28 IMAGING — CR DG CHEST 1V PORT
1 series · 1 of 1 positions shown · non-contrast
Comparison: 09/16/06.

CLINICAL DATA: Chest pain. 
 PORTABLE CHEST - 1 VIEW ? 02/17/07:

[view not recorded]
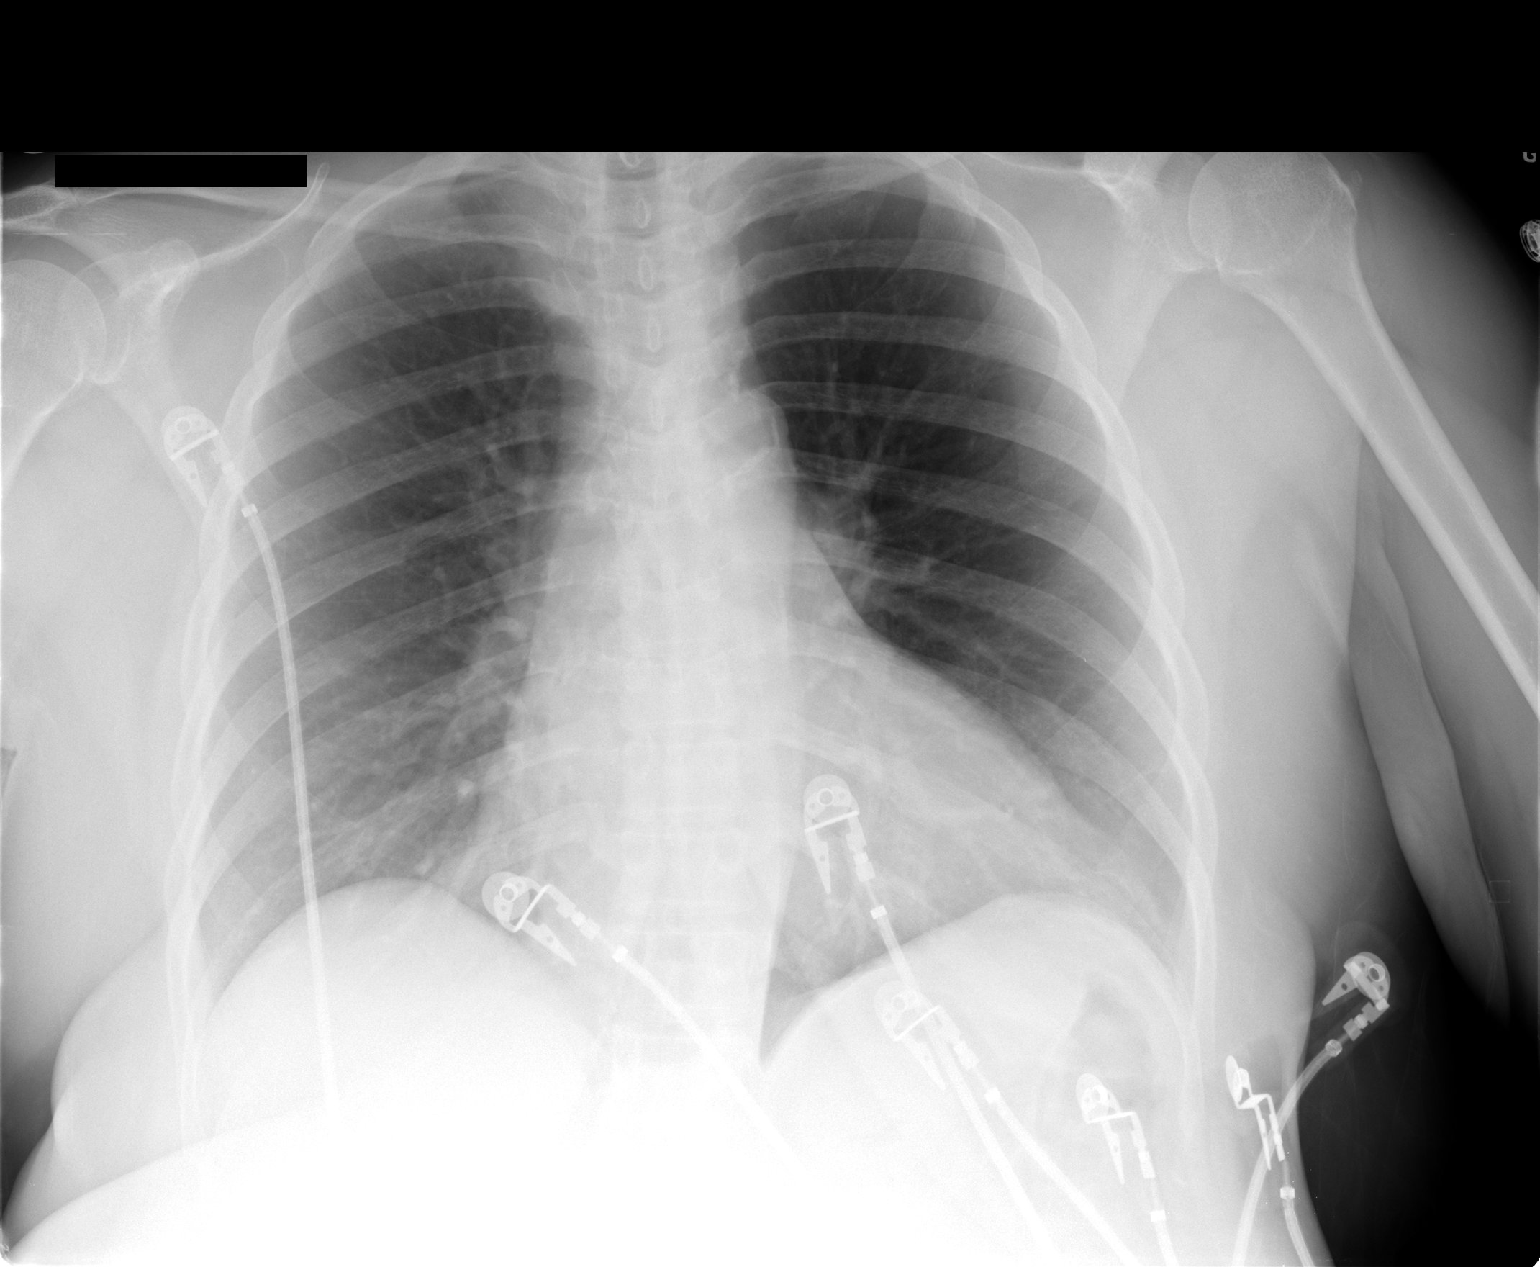

[1 of 1 positions shown; findings below may reference images not displayed]

FINDINGS: Heart size within normal limits for AP projection.  The lungs are clear.   No heart failure.
IMPRESSION: No active disease.

## 2009-10-31 IMAGING — CR DG ABDOMEN 2V
2 series · 2 of 2 positions shown · non-contrast
Comparison: none

CLINICAL DATA: Abdominal pain.  Nausea.  Blood in stool.  
 ABDOMEN ? 2 VIEW:

[w abdomen upright]
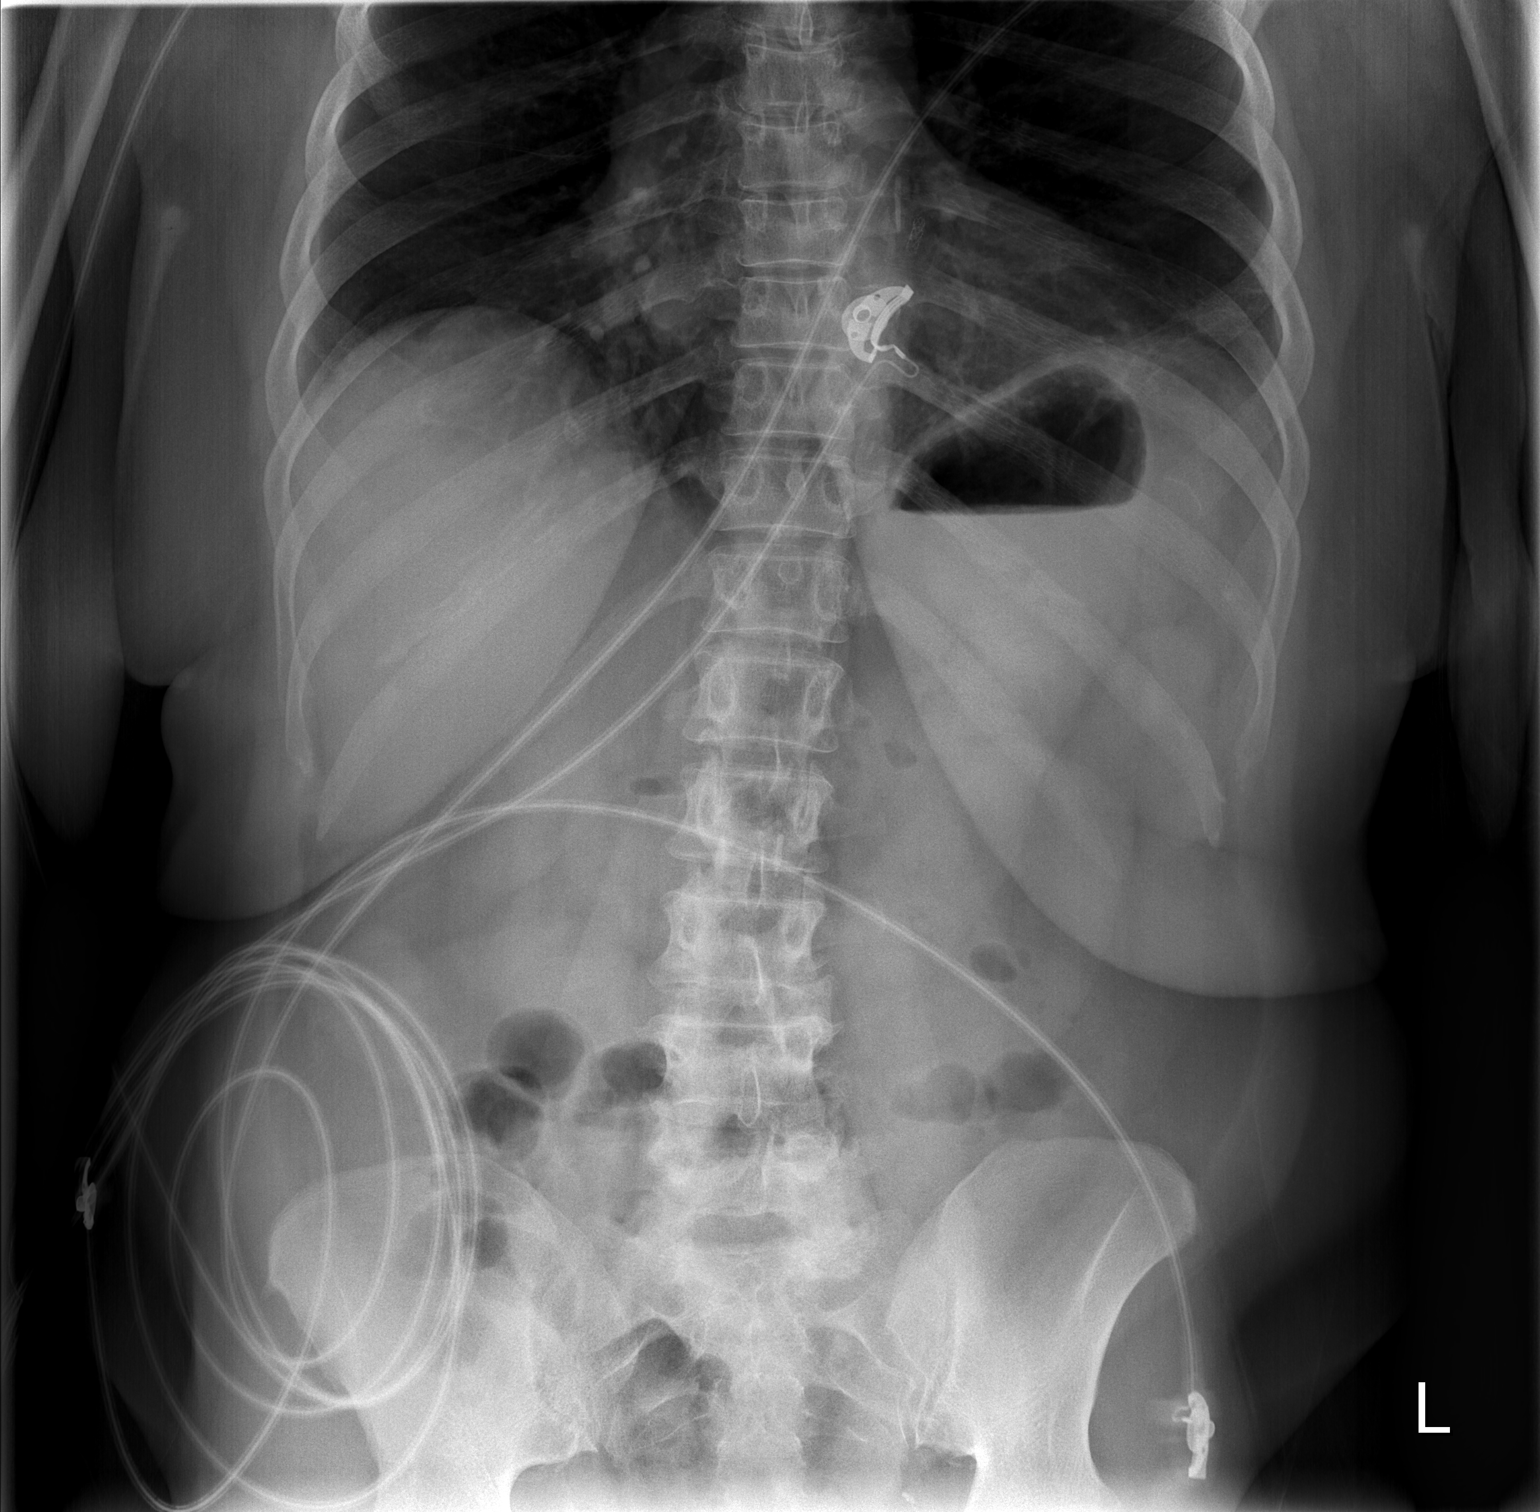

[t abdomen supine]
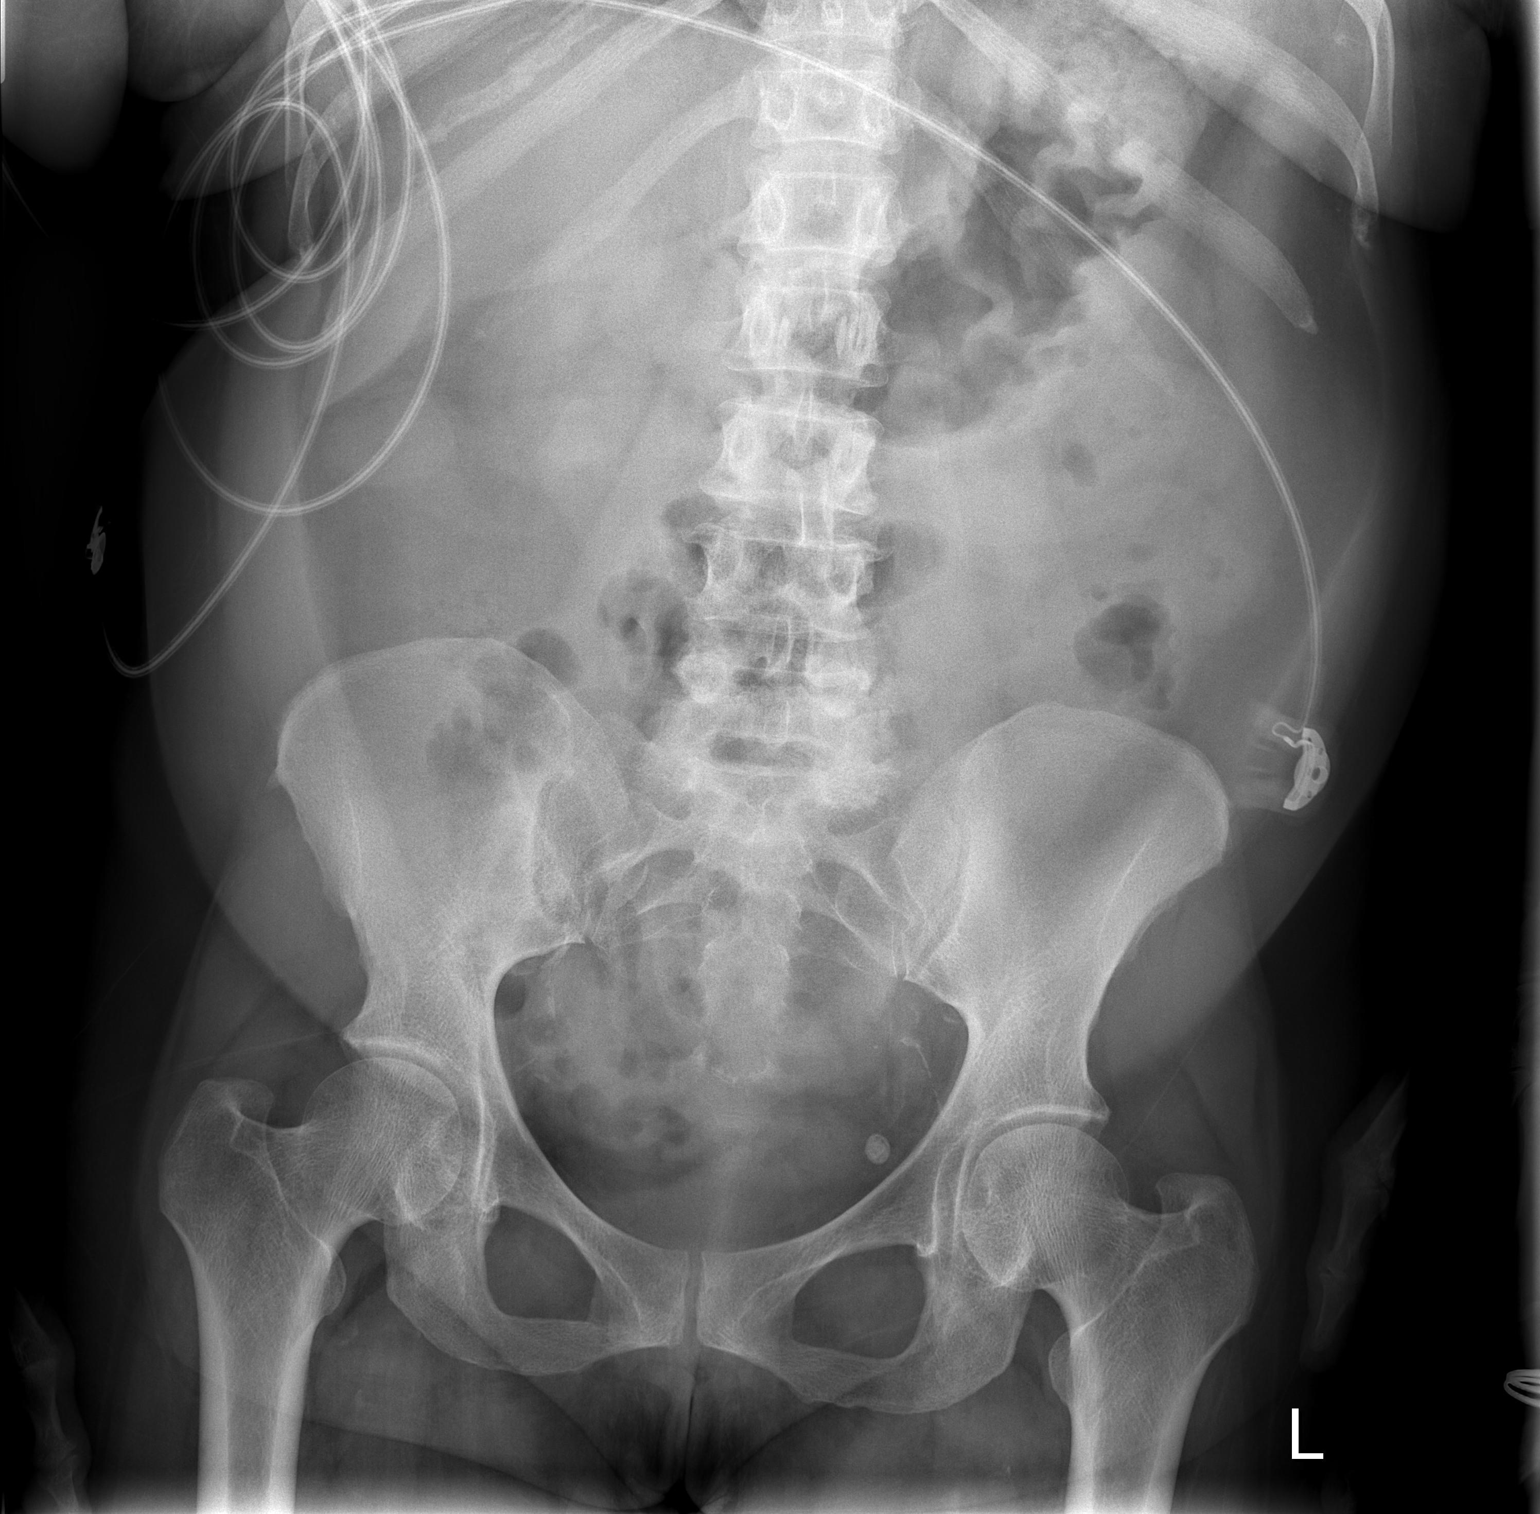

[2 of 2 positions shown; findings below may reference images not displayed]

FINDINGS: The bowel gas pattern is normal.  There is no evidence of dilated bowel loops.  Pelvic vascular calcification is seen.  There is no evidence of free intraperitoneal air.
IMPRESSION: Normal bowel gas pattern.  No acute findings.

## 2009-11-02 IMAGING — CT CT PELVIS W/ CM
2 of 5 series · 13 of 32 positions shown, 18 images · IV contrast (omnipaque)
Comparison: None.

CLINICAL DATA: Abdominal pain postpolypectomy.  Some swelling.  Blood in urine and stool.  
 ABDOMEN CT WITH CONTRAST:
TECHNIQUE: Multidetector CT imaging of the abdomen was performed following the standard protocol during bolus administration of intravenous contrast.
 Contrast:  100 cc Omnipaque 300.
TECHNIQUE: Multidetector CT imaging of the pelvis was performed following the standard protocol during bolus administration of intravenous contrast.

[Series 2: routine abdomen · axial · 0.75mm/px · z∈[-334,-20]mm · 8 of 83 slices shown, 13 images]
[im 10/83  soft-tissue]
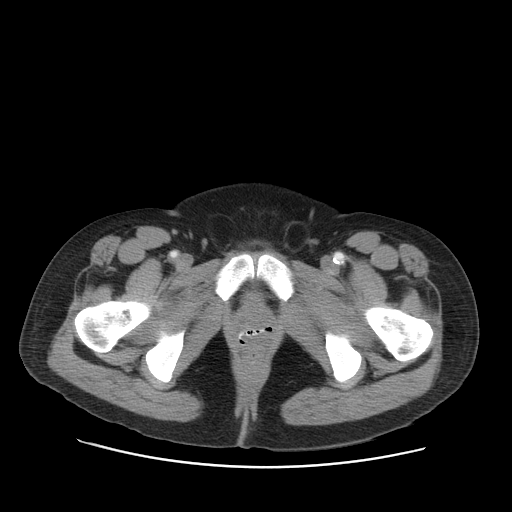
[im 10/83  bone]
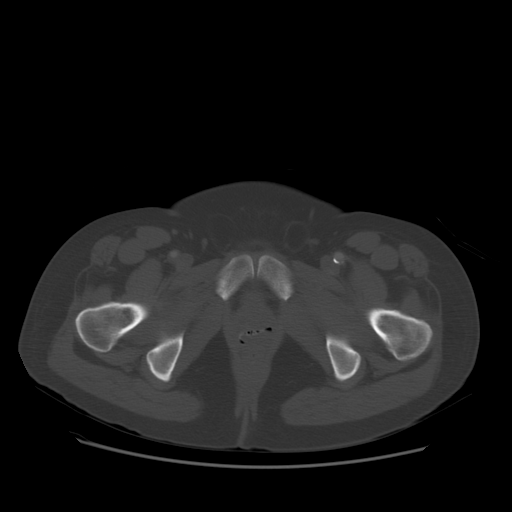
[im 19/83  soft-tissue]
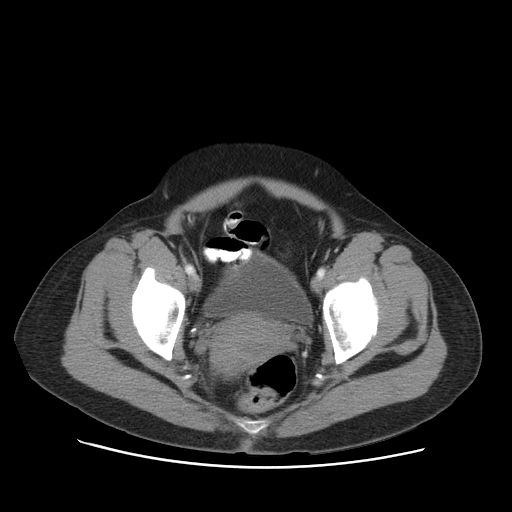
[im 28/83  soft-tissue]
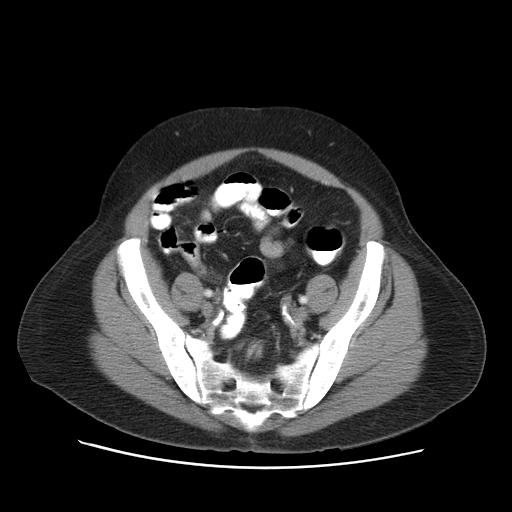
[im 37/83  soft-tissue]
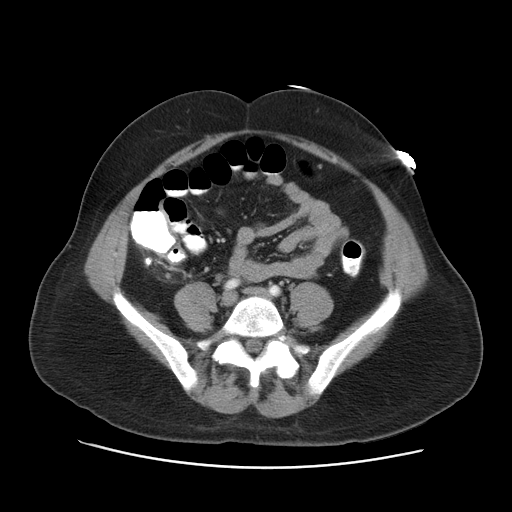
[im 46/83  soft-tissue]
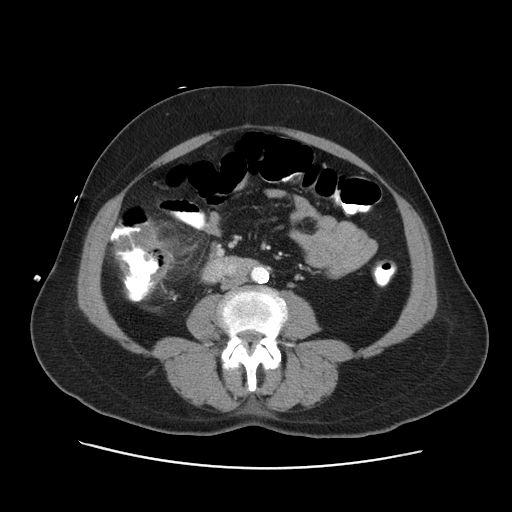
[im 46/83  lung]
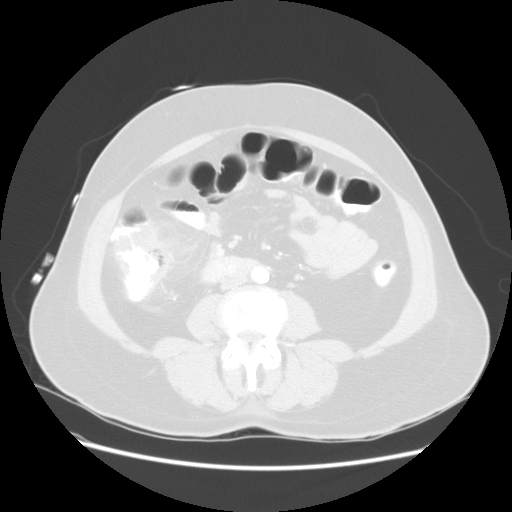
[im 55/83  soft-tissue]
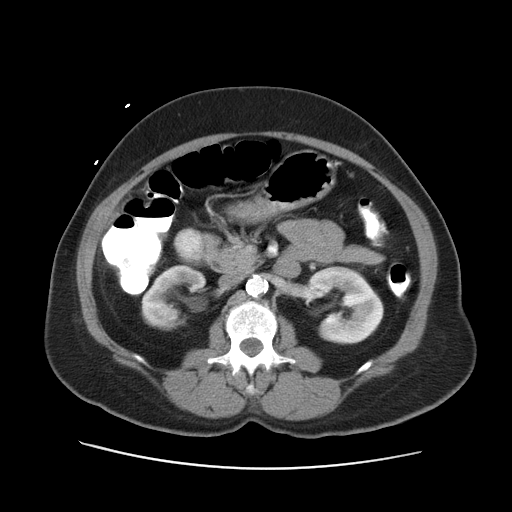
[im 55/83  lung]
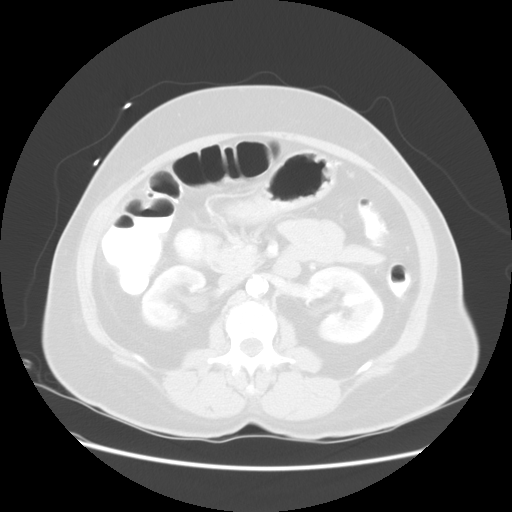
[im 64/83  soft-tissue]
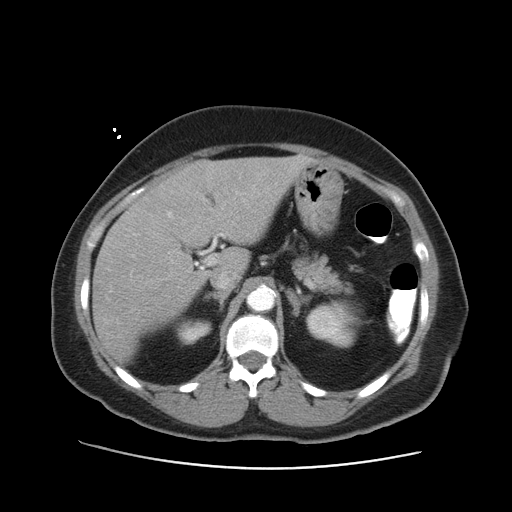
[im 64/83  lung]
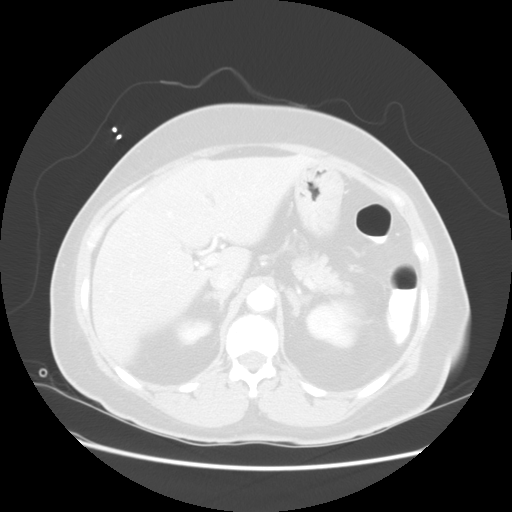
[im 73/83  soft-tissue]
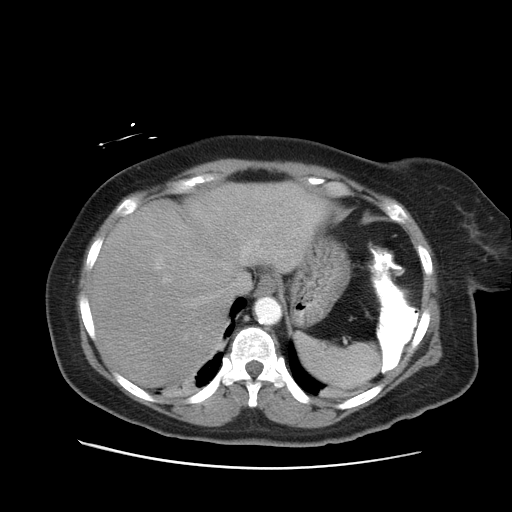
[im 73/83  lung]
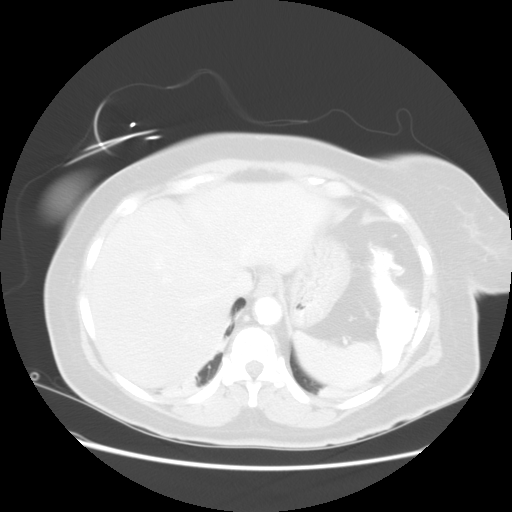

[Series 400: reformatted · sagittal · 0.82mm/px · 5 of 79 slices shown]
[im 10/79  soft-tissue]
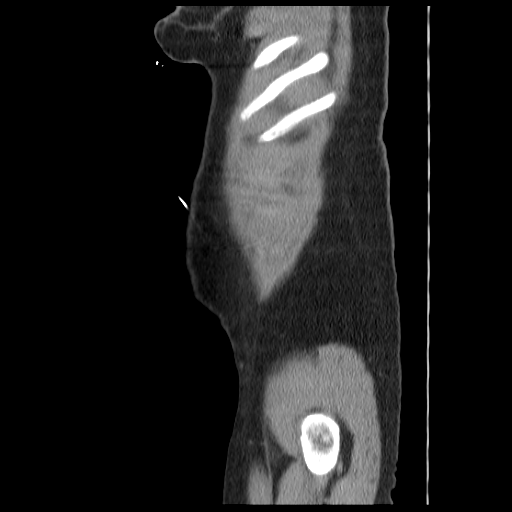
[im 20/79  soft-tissue]
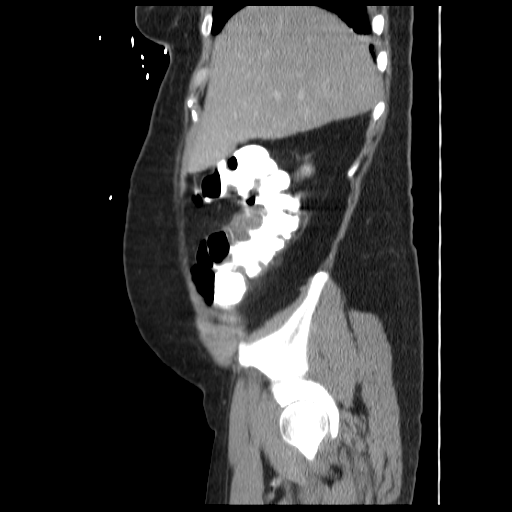
[im 30/79  soft-tissue]
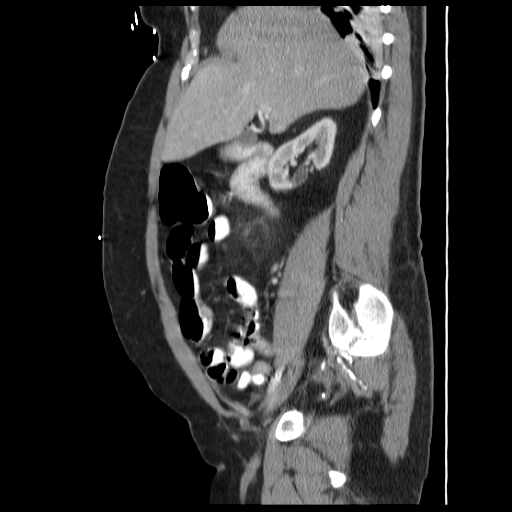
[im 40/79  soft-tissue]
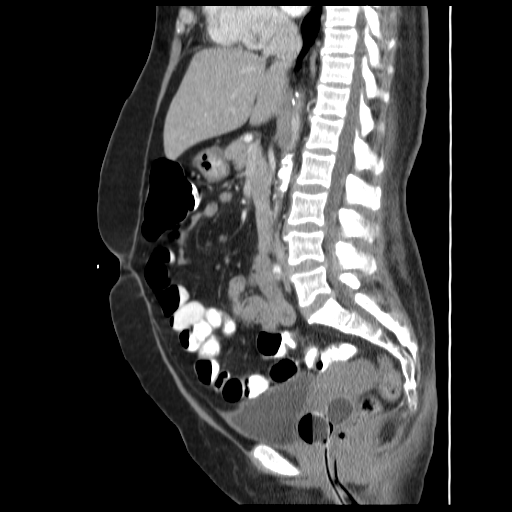
[im 49/79  soft-tissue]
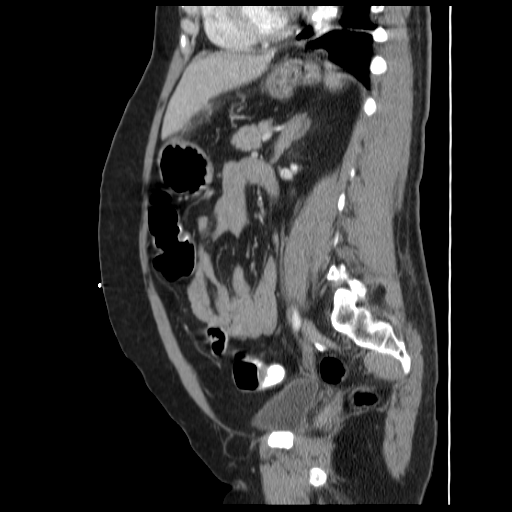

[13 of 32 positions shown; findings below may reference images not displayed]

FINDINGS: There is opacity at both lung bases with air bronchograms.  Although this may represent atelectasis, pneumonia in both lower lobes is a definite consideration.  No effusion is seen.  The liver is low in attenuation consistent with fatty infiltration with areas of sparing.  No ductal dilatation is seen.  No calcified gallstones are noted.  The pancreas is normal in size, and the pancreatic duct is not dilated.  The adrenal glands and spleen appear normal.  The kidneys enhance, and on delayed images the pelvicaliceal systems appear normal.  There is mucosal edema within the ascending colon with surrounding strandiness and some extraluminal air consistent with perforation.  With the asymmetric soft tissue at that site, neoplasm is a definite consideration.
IMPRESSION: 1.  Prominent soft tissue in the ascending colon with extraluminal air and pericolonic strandiness at that site consistent with perforation.  Cannot exclude neoplasm with asymmetric soft tissue present.  
 2.  Fatty infiltration of the liver. 
 PELVIS CT WITH CONTRAST:
FINDINGS: The appendix appears normal.  The urinary bladder is unremarkable.  There is a large amount of fluid within the vagina, and clinical correlation or recommended.  The uterus is grossly normal.  No free fluid is seen within the pelvis.  It appears that the catheter is within the vagina rather than the bladder, and clinical correlation is recommended.
IMPRESSION: 1.  A large amount of fluid in the vagina with apparent catheter present.  The catheter does not appear to be within the urinary bladder.  
 2.  Appendix appears normal.  
 I discussed these findings with Dr. Ionuc at the time of interpretation.   He is to contact Dr. Servellon concerning the results.

## 2009-11-03 IMAGING — CR DG CHEST 1V PORT
1 series · 1 of 1 positions shown · non-contrast
Comparison: none

CLINICAL DATA: PICC line placement.  Chest pain. 
 PORTABLE CHEST - 1 VIEW - 02/23/07 AT 4767 HOURS:

[view not recorded]
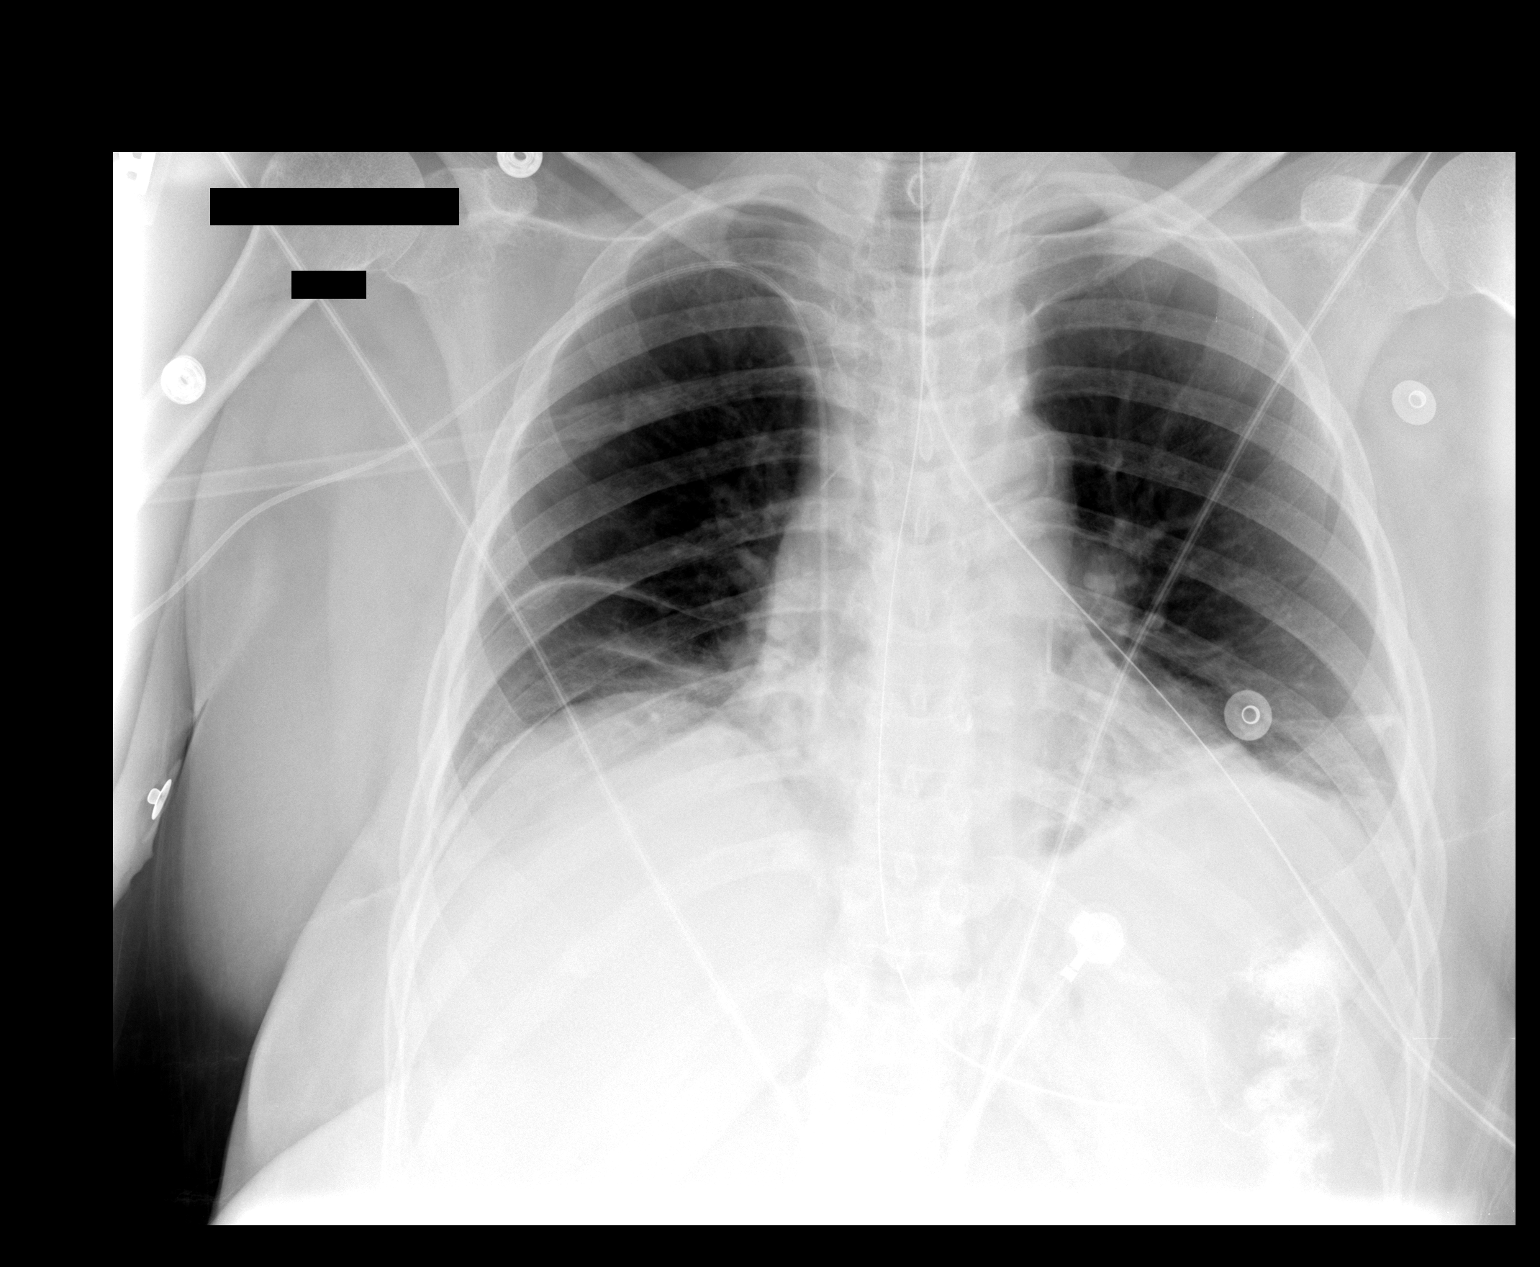

[1 of 1 positions shown; findings below may reference images not displayed]

FINDINGS: A right PICC line is present with the tip in the region of the right atrium.  No pneumothorax is seen.  Basilar atelectasis is present bilaterally.  NG tube is noted.
IMPRESSION: Right PICC tip in right atrium.  No pneumothorax.  Basilar atelectasis.

## 2009-11-04 IMAGING — CR DG ABD PORTABLE 1V
1 series · 1 of 1 positions shown · non-contrast
Comparison: CT abdomen and pelvis 02/22/07

CLINICAL DATA: Check NG tube placement.
PORTABLE ABDOMEN ? 1 VIEW:

[AP]
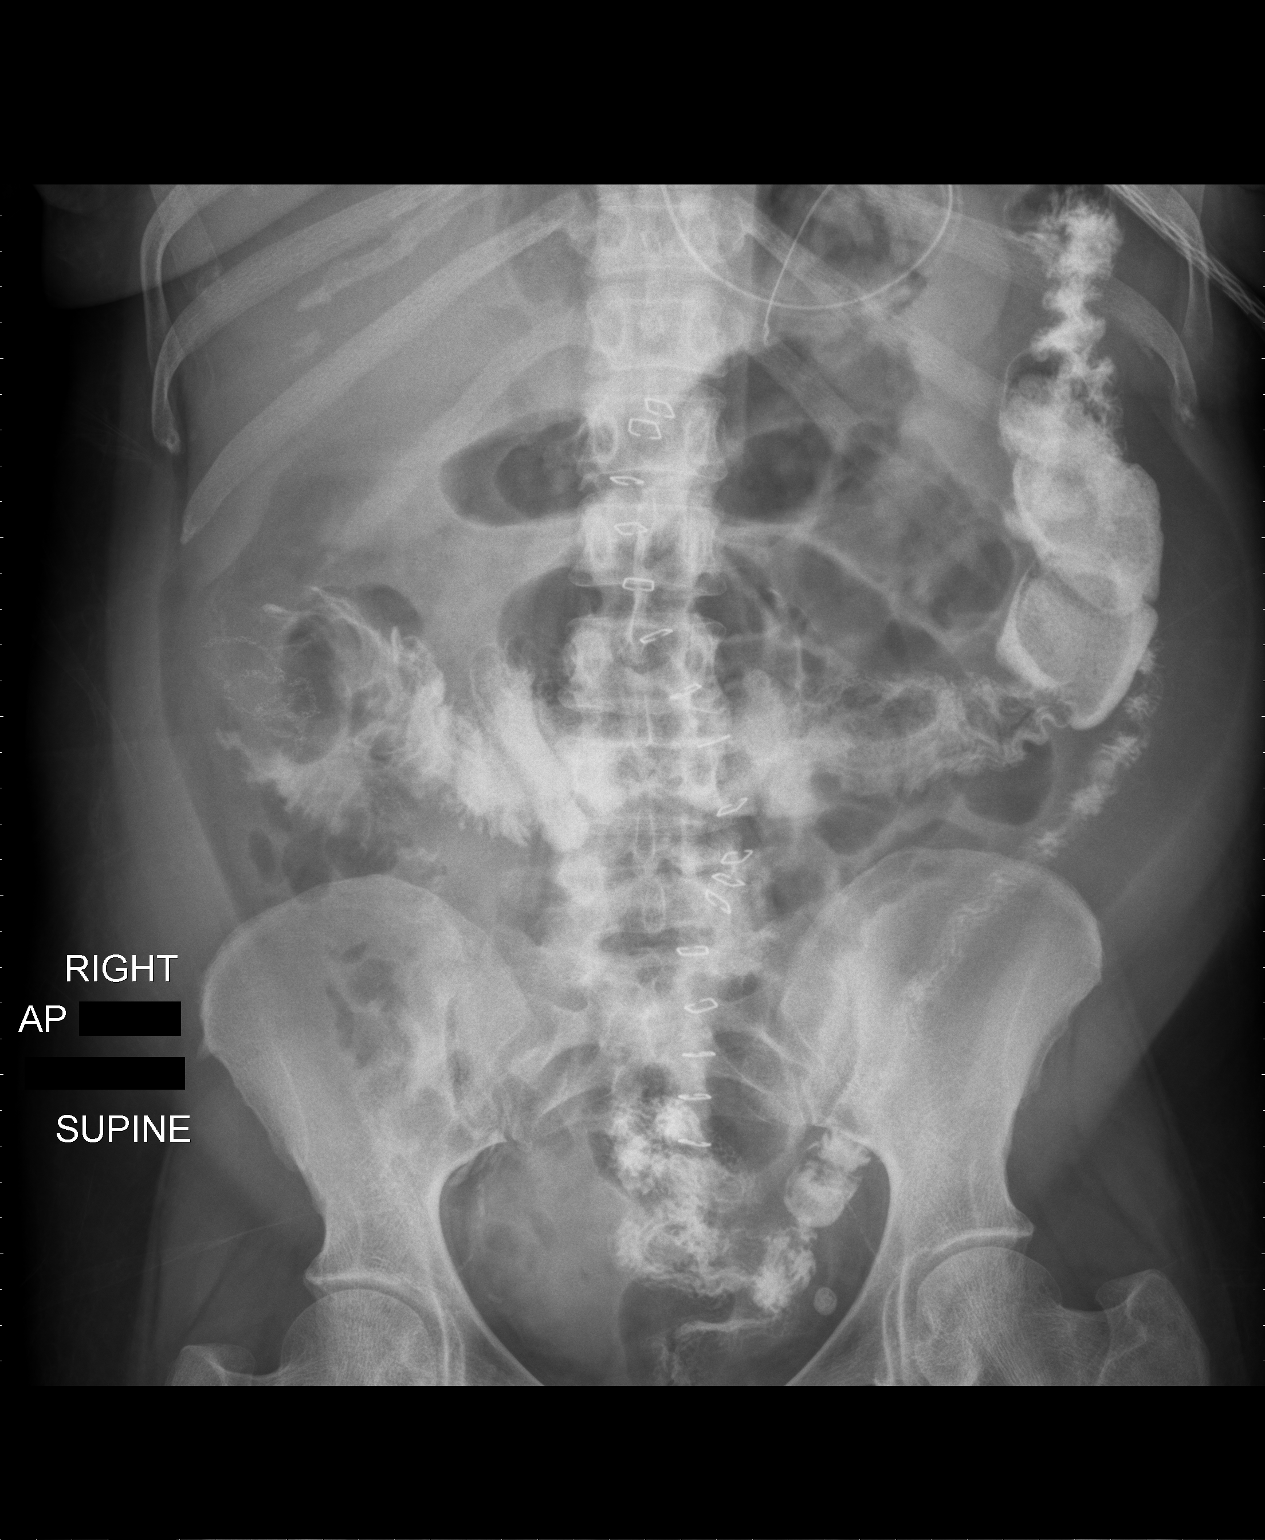

[1 of 1 positions shown; findings below may reference images not displayed]

FINDINGS: Nasogastric tube enters the stomach, coils at the fundus, and the distal tip is in the body of the stomach, in good position.  
There is a vertical line of skin staples projecting over the abdomen.  Suture material projects over the expected location of the ascending colon in the right abdomen.  There is contrast material in the colon, residual from recent CT.  Contrast is seen to the level of the rectum.  No evidence of bowel obstruction.
IMPRESSION: Nasogastric tube in stomach, in good position.

## 2009-11-05 IMAGING — CR DG CHEST 1V PORT
1 series · 1 of 1 positions shown · non-contrast
Comparison: 02/23/07.

CLINICAL DATA: Chest pain ? question pneumonia.  
 PORTABLE CHEST - 1 VIEW 02/25/07:

[view not recorded]
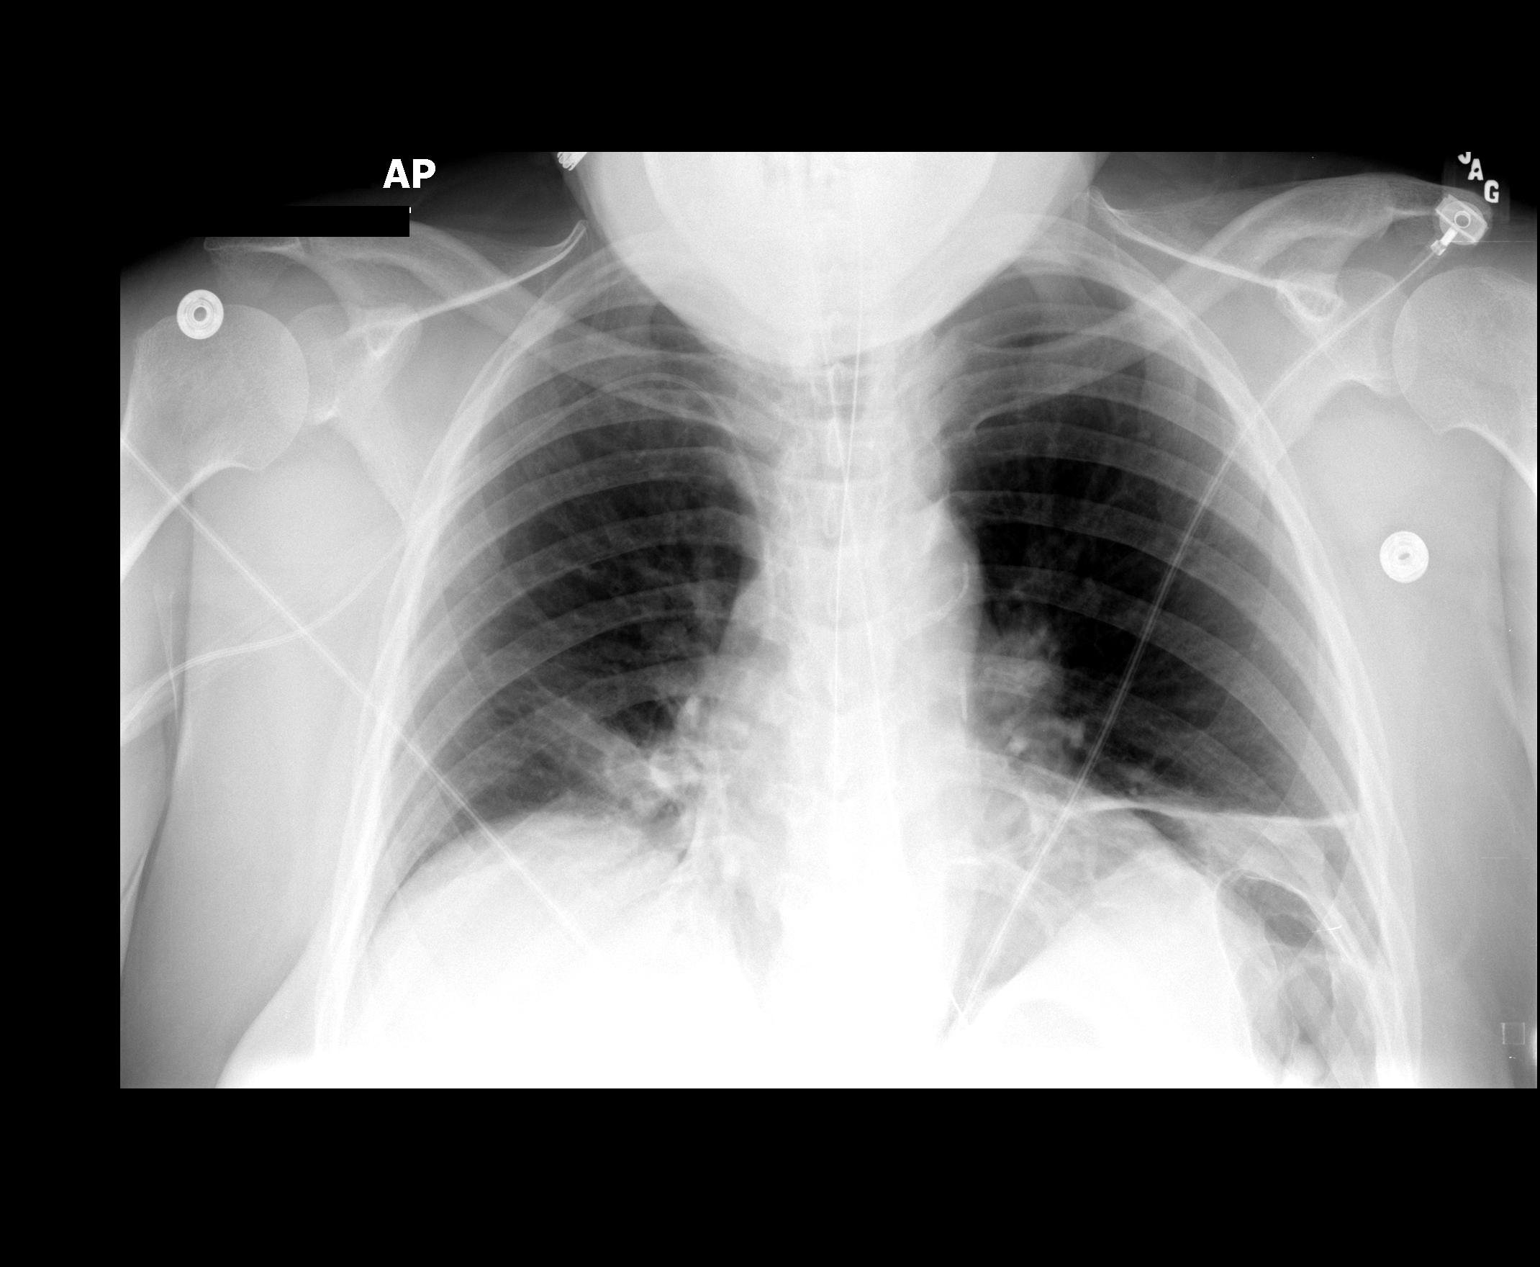

[1 of 1 positions shown; findings below may reference images not displayed]

FINDINGS: There is bibasilar subsegmental atelectasis however there is a new subsegmental density on the right that could be pneumonia.  This is nonspecific since it also could represent subsegmental atelectasis.  
 Heart normal without failure.  
 PICC line unchanged.
IMPRESSION: Increased right lower lobe subsegmental atelectasis or atelectatic pneumonia.

## 2009-11-13 IMAGING — CR DG ABDOMEN 2V
2 series · 2 of 2 positions shown · non-contrast
Comparison: 02/23/06.

CLINICAL DATA: Nausea and vomiting with abdominal distention.  Postop. 
 ABDOMEN   - 2 VIEW:

[w abdomen upright]
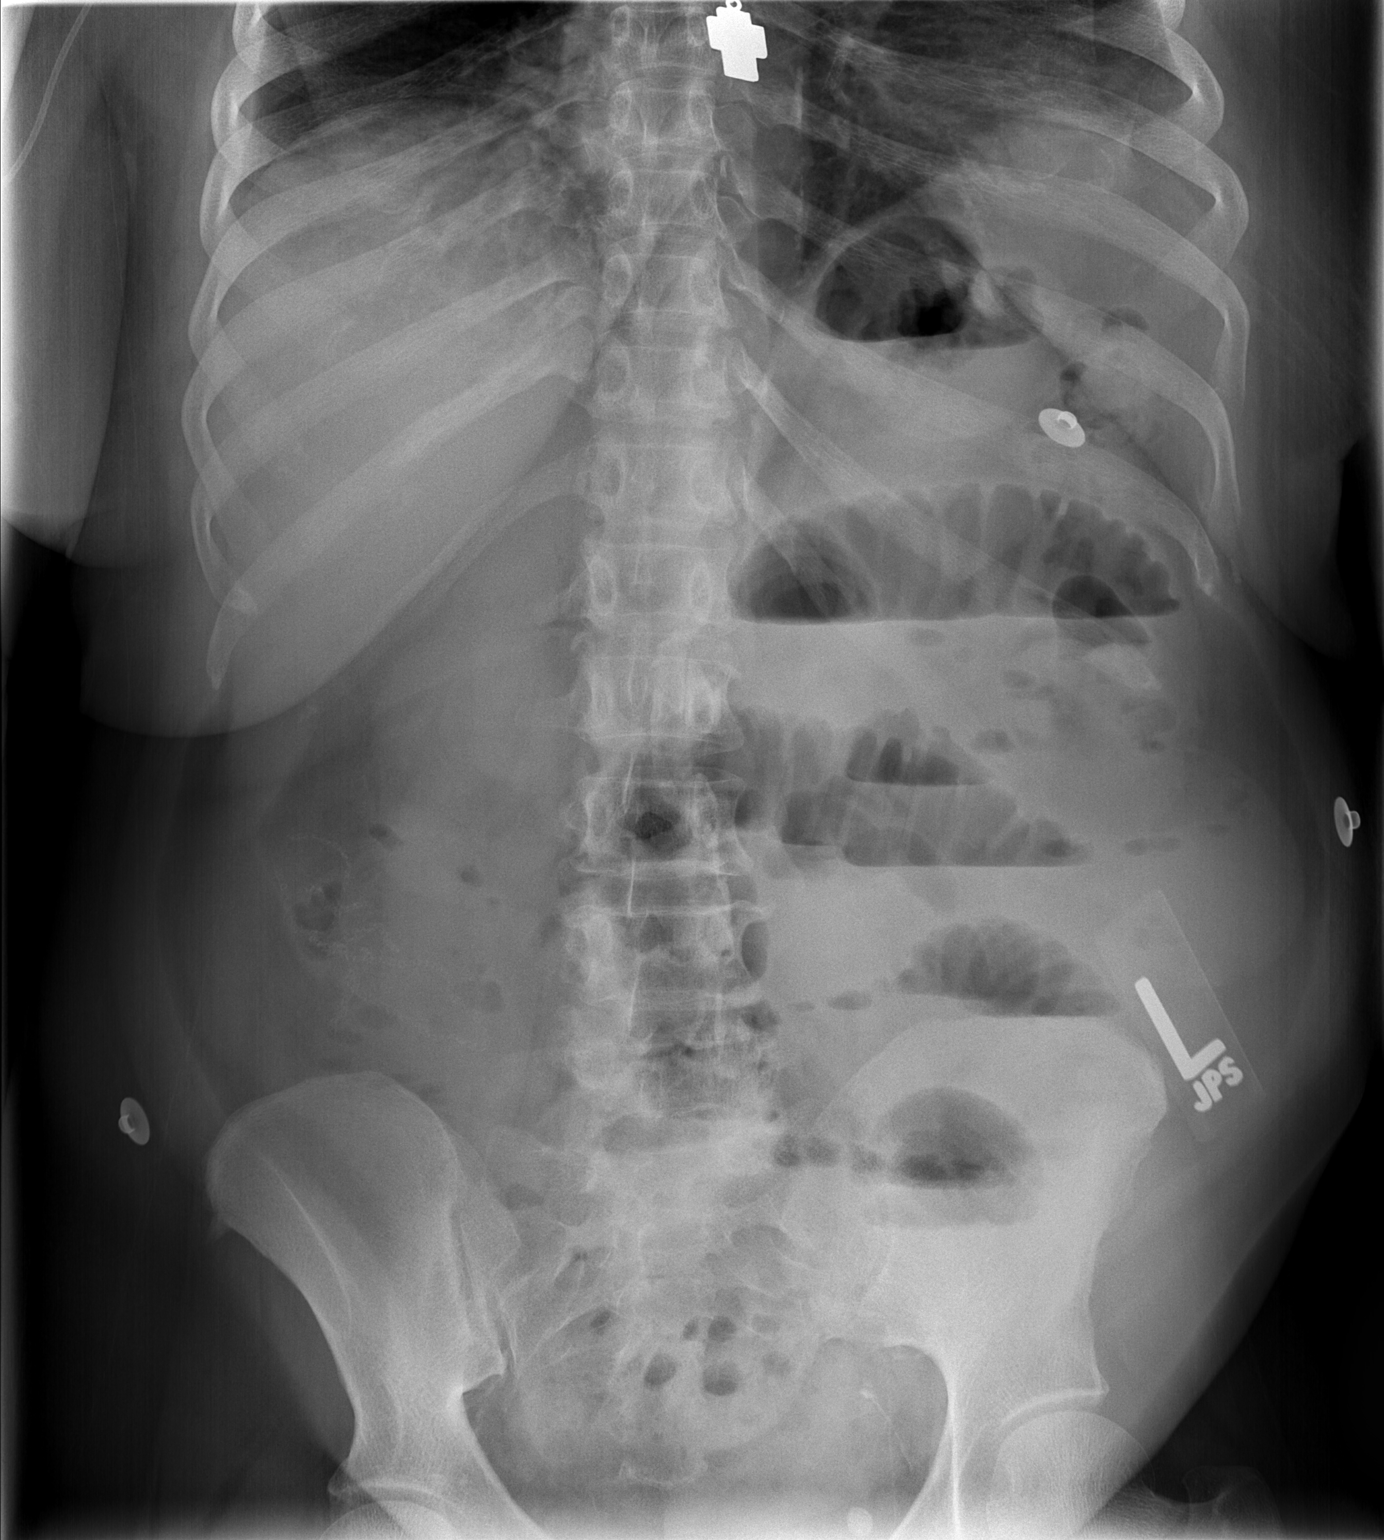

[t abdomen supine]
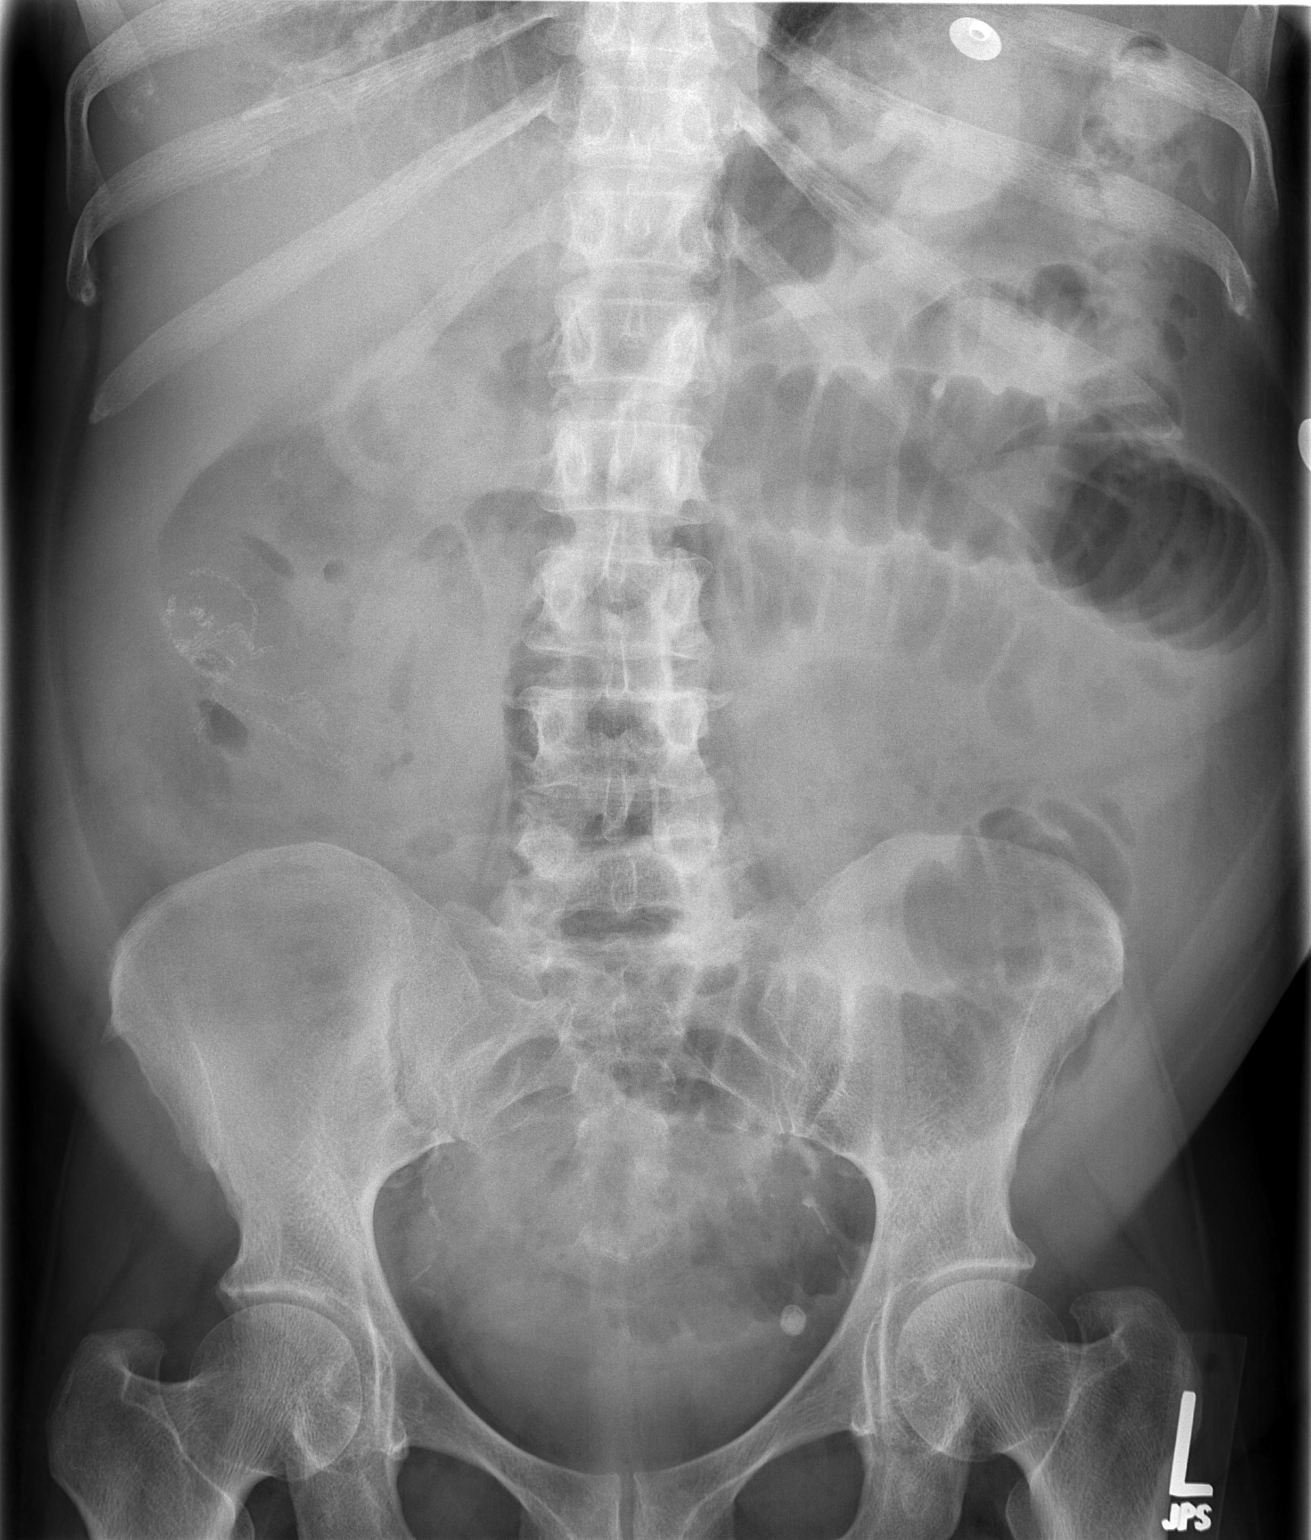

[2 of 2 positions shown; findings below may reference images not displayed]

FINDINGS: There is dilated small bowel with multiple air fluid levels.  Minimal colonic gas.  Postoperative changes are seen in the right abdomen.
IMPRESSION: Earlier partial small bowel obstruction.

## 2009-11-14 IMAGING — CR DG ABDOMEN 2V
2 series · 2 of 2 positions shown · non-contrast
Comparison: 03/05/07.

CLINICAL DATA: Small bowel obstruction.  Lower abdominal pain. 
 ABDOMEN ? 2 VIEW:

[w abdomen upright]
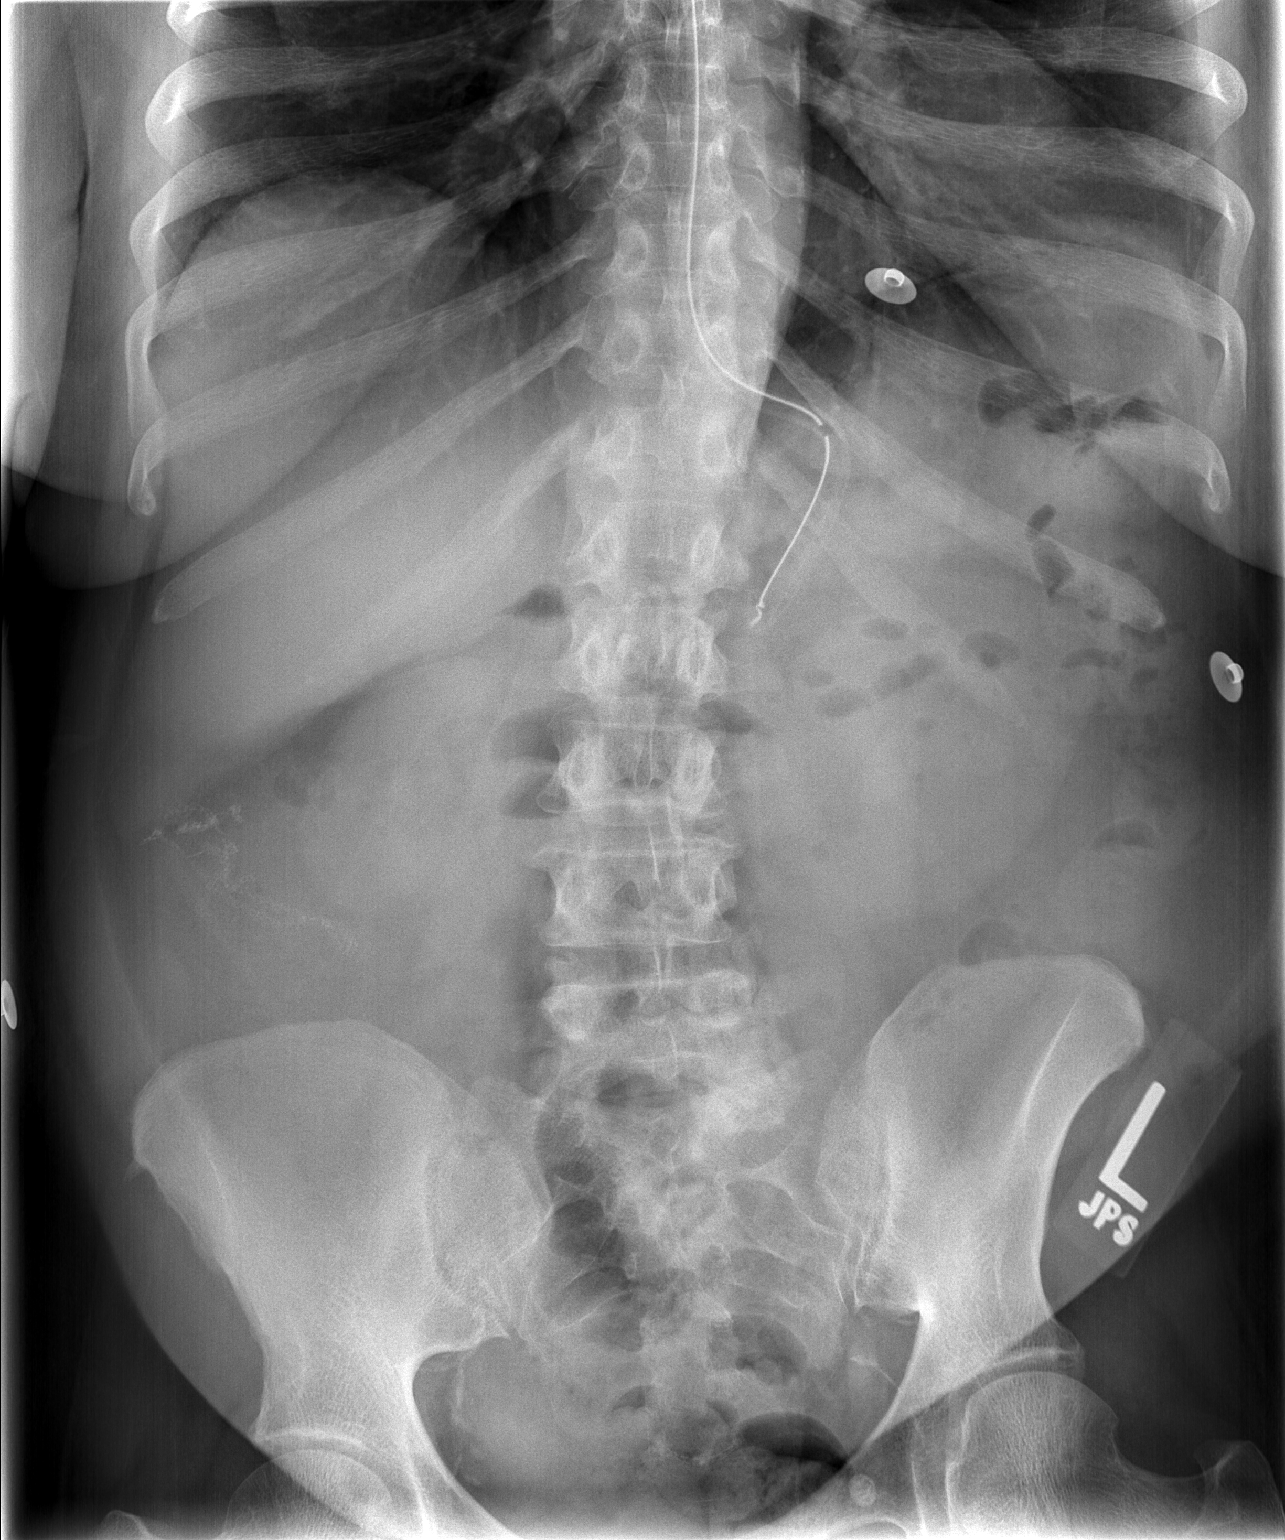

[t abdomen supine]
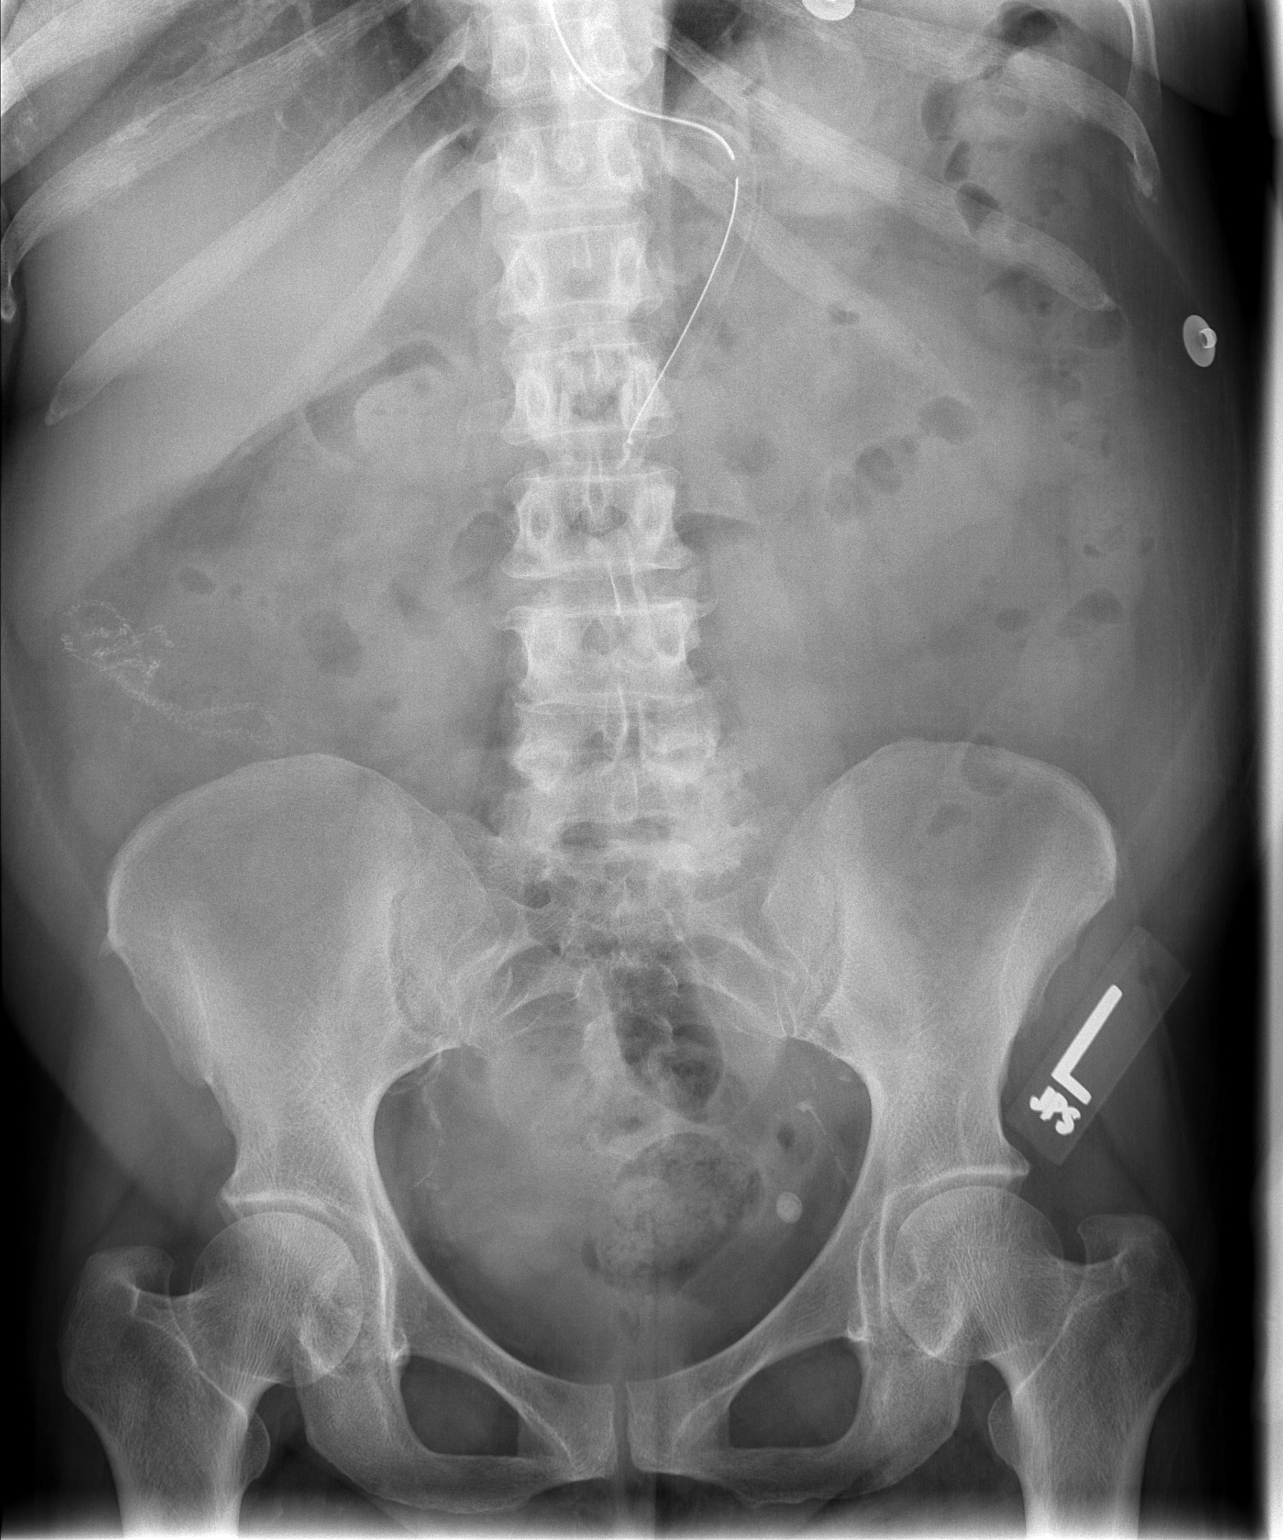

[2 of 2 positions shown; findings below may reference images not displayed]

FINDINGS: A nasogastric tube has been placed into the proximal stomach.  The small bowel is decompressed without residual distention.  There are a few scattered air fluid levels.  Some gas is present in the descending colon and rectum.  There are bowel anastomosis clips in the right abdomen.  There is no evidence of free intraperitoneal air.  Vascular calcifications of the pelvis appear stable.
IMPRESSION: Small bowel decompression status post nasogastric tube placement.  No evidence of hemoperitoneum.

## 2009-11-15 IMAGING — CR DG ABDOMEN ACUTE W/ 1V CHEST
3 series · 3 of 3 positions shown · non-contrast
Comparison: Abdomen films of 03/06/07 and chest film of 02/25/07.

CLINICAL DATA: 57-year-old female with chest pain and weakness. 
 ACUTE ABDOMINAL SERIES:

[w chest pa]
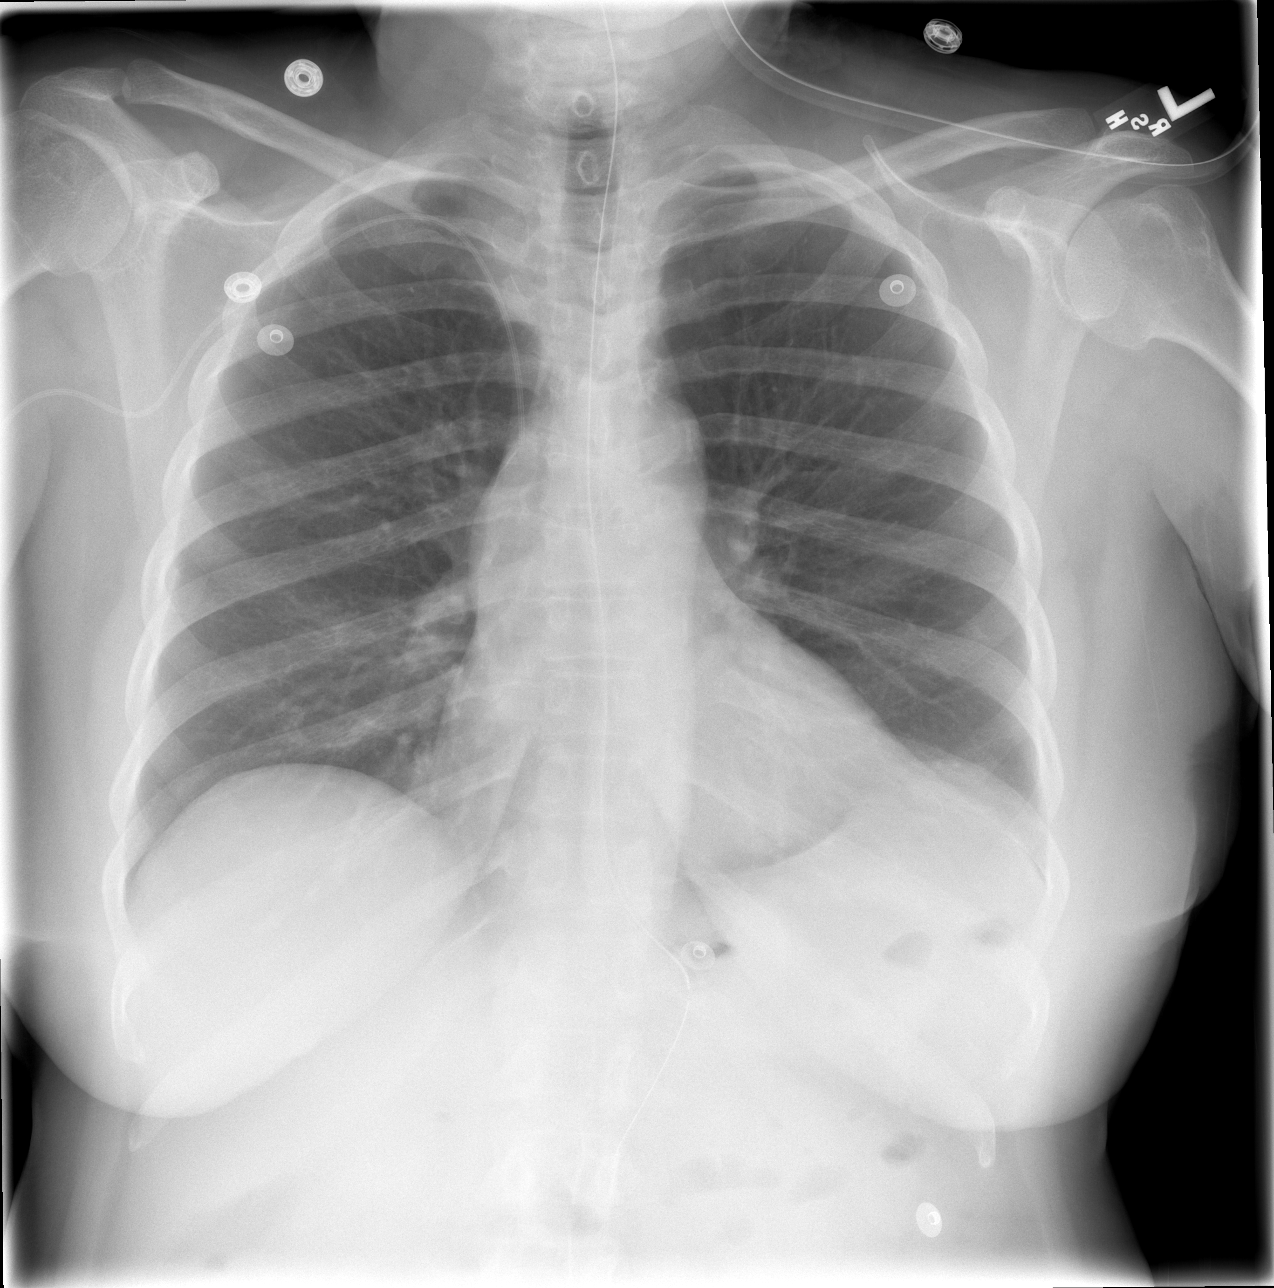

[w abdomen upright]
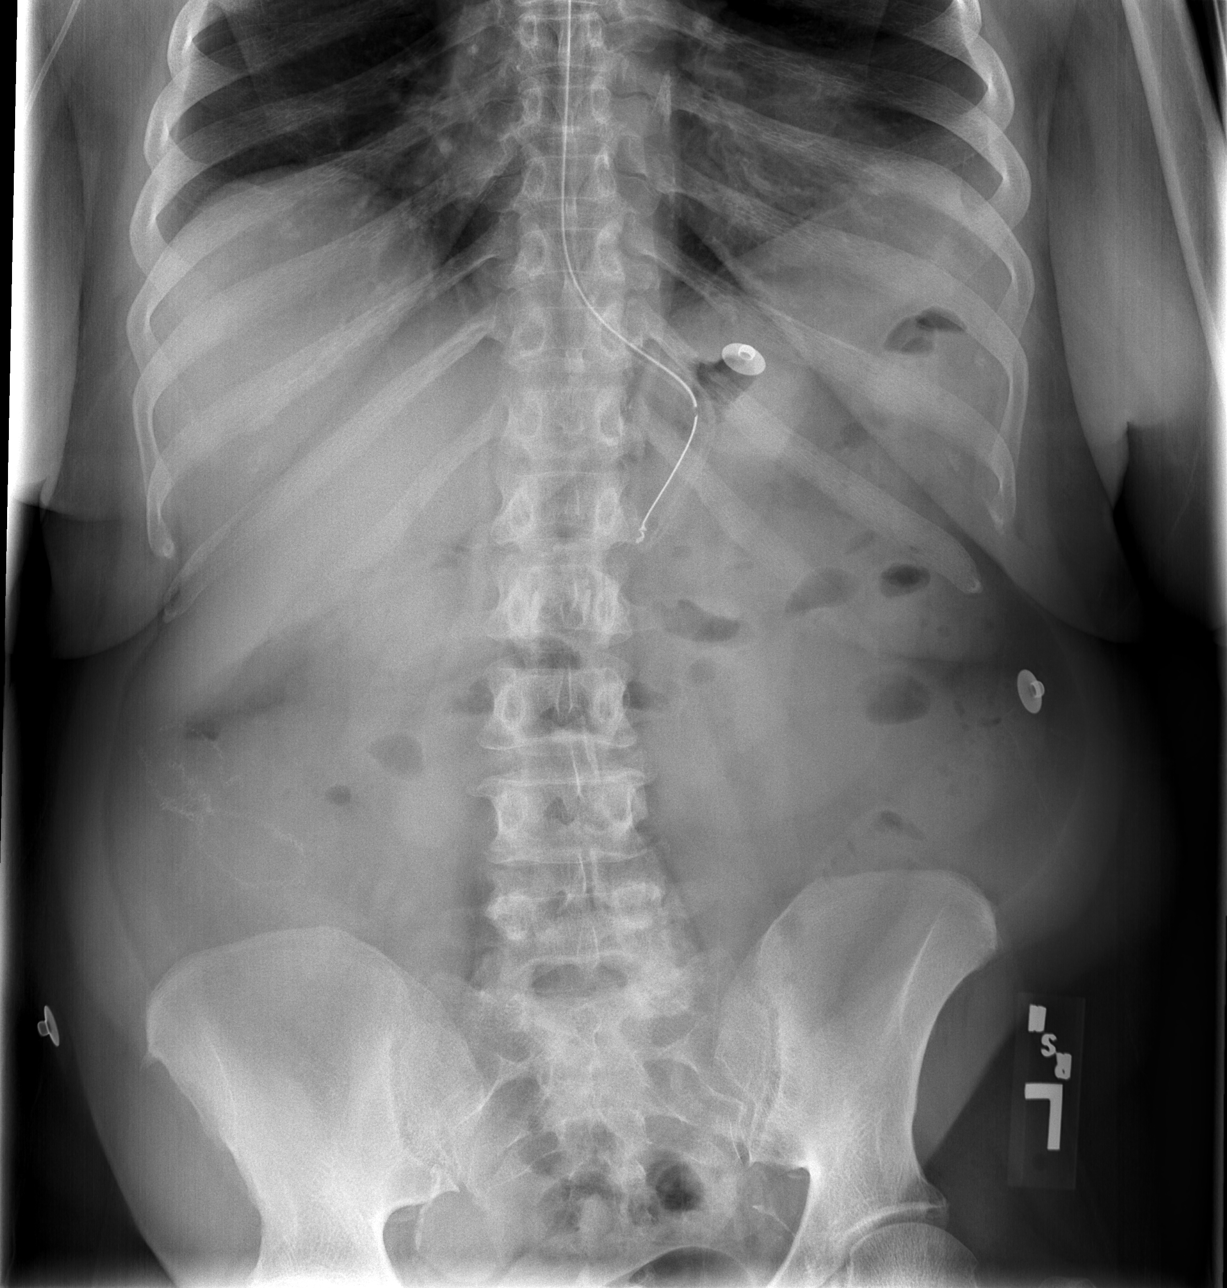

[t abdomen supine]
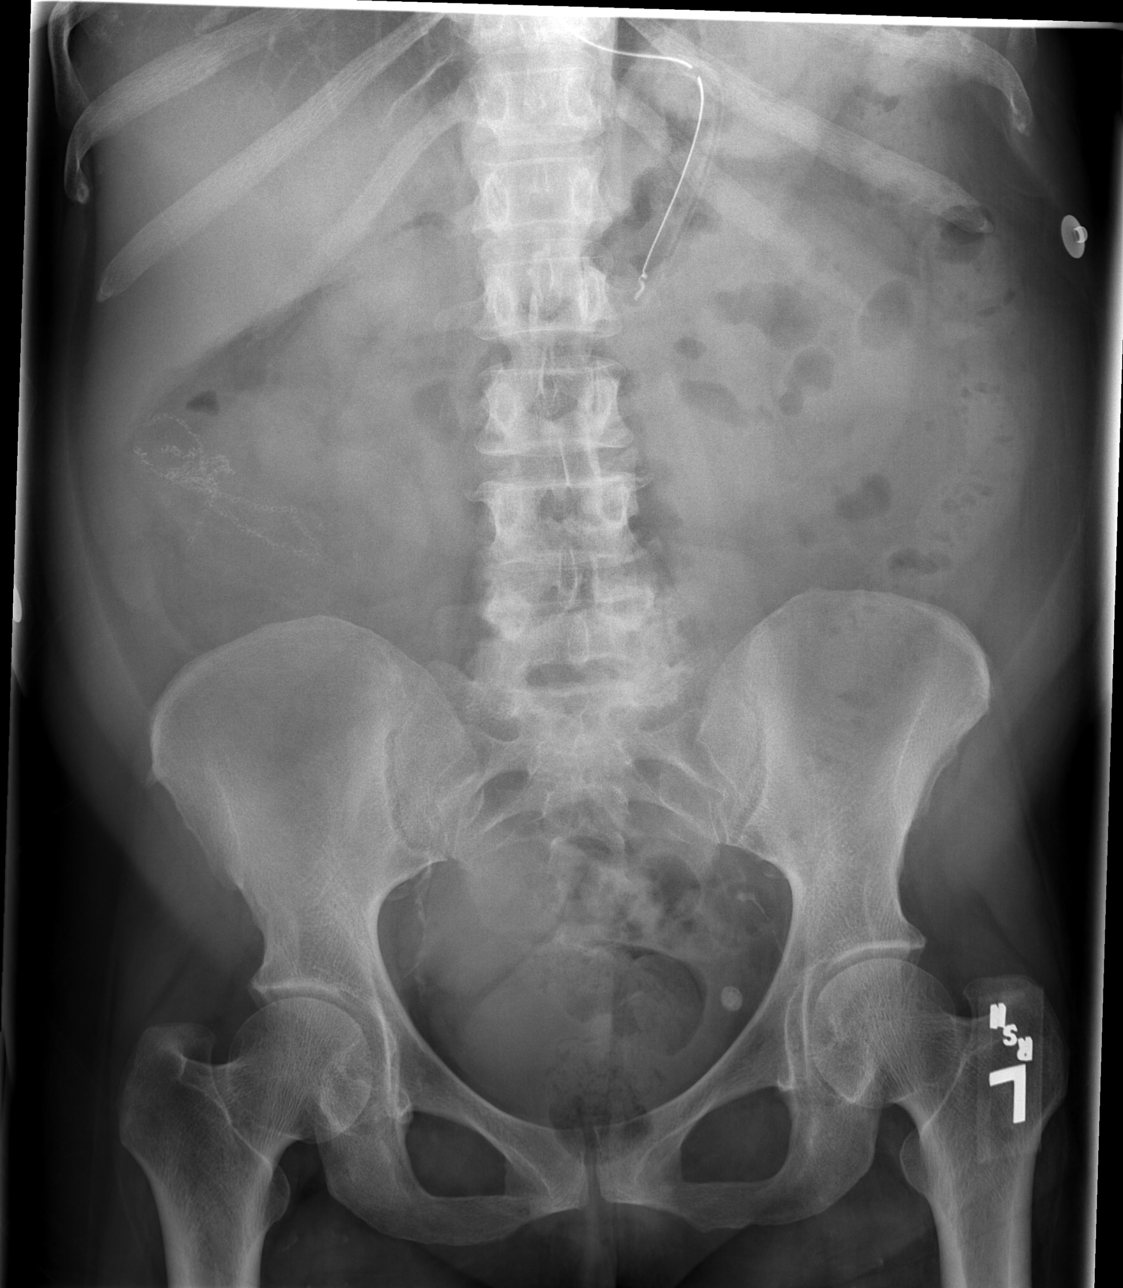

[3 of 3 positions shown; findings below may reference images not displayed]

FINDINGS: The cardiopericardial silhouette is within normal limits for size.  The lungs are clear.  Right-sided PICC line is stable in position.
 Supine and upright views of the abdomen demonstrate the nasogastric tube remains in the proximal stomach.  The bowel gas pattern is nonspecific without evidence for obstruction or free air.  Postoperative changes of the right abdomen are redemonstrated.
IMPRESSION: 1.  No acute cardiopulmonary disease. 
 2.  Stable decompression of the abdomen.

## 2009-11-16 IMAGING — CR DG ABDOMEN 2V
2 series · 2 of 2 positions shown · non-contrast
Comparison: 03/07/2007, 03/06/2007.

CLINICAL DATA: Chest and abdominal pain.
ABDOMEN ? 2 VIEW:

[w abdomen upright]
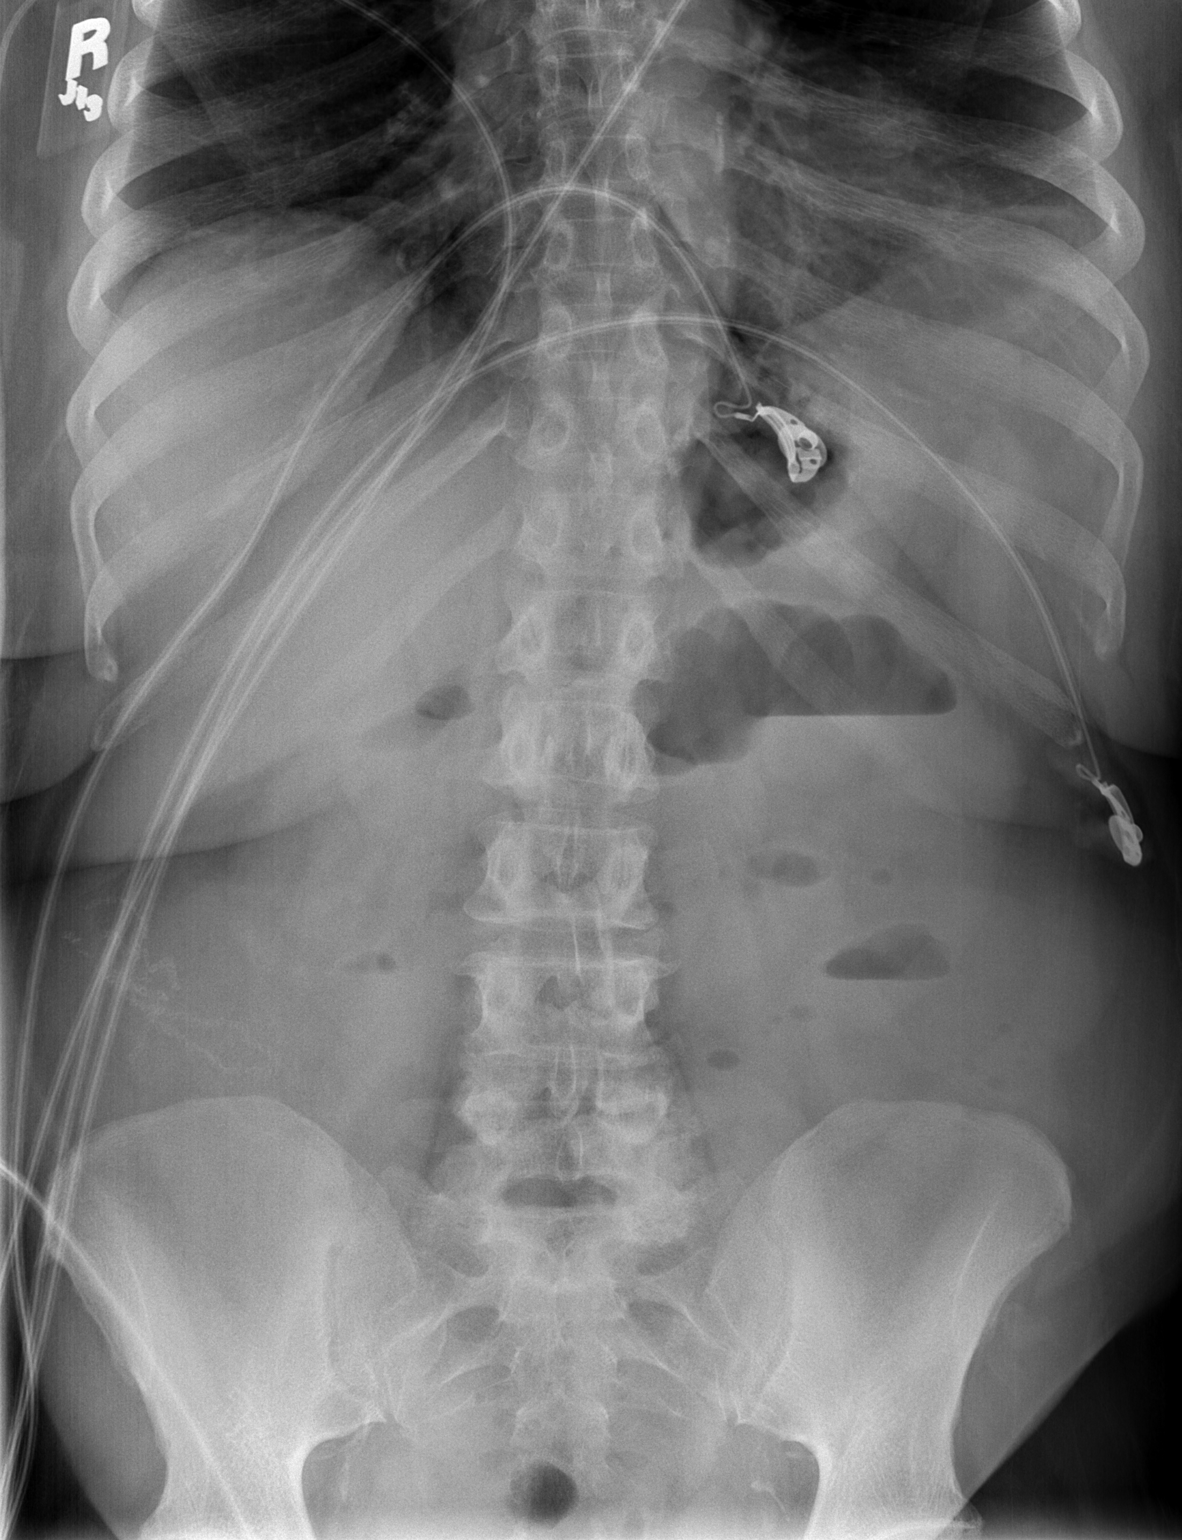

[t abdomen supine]
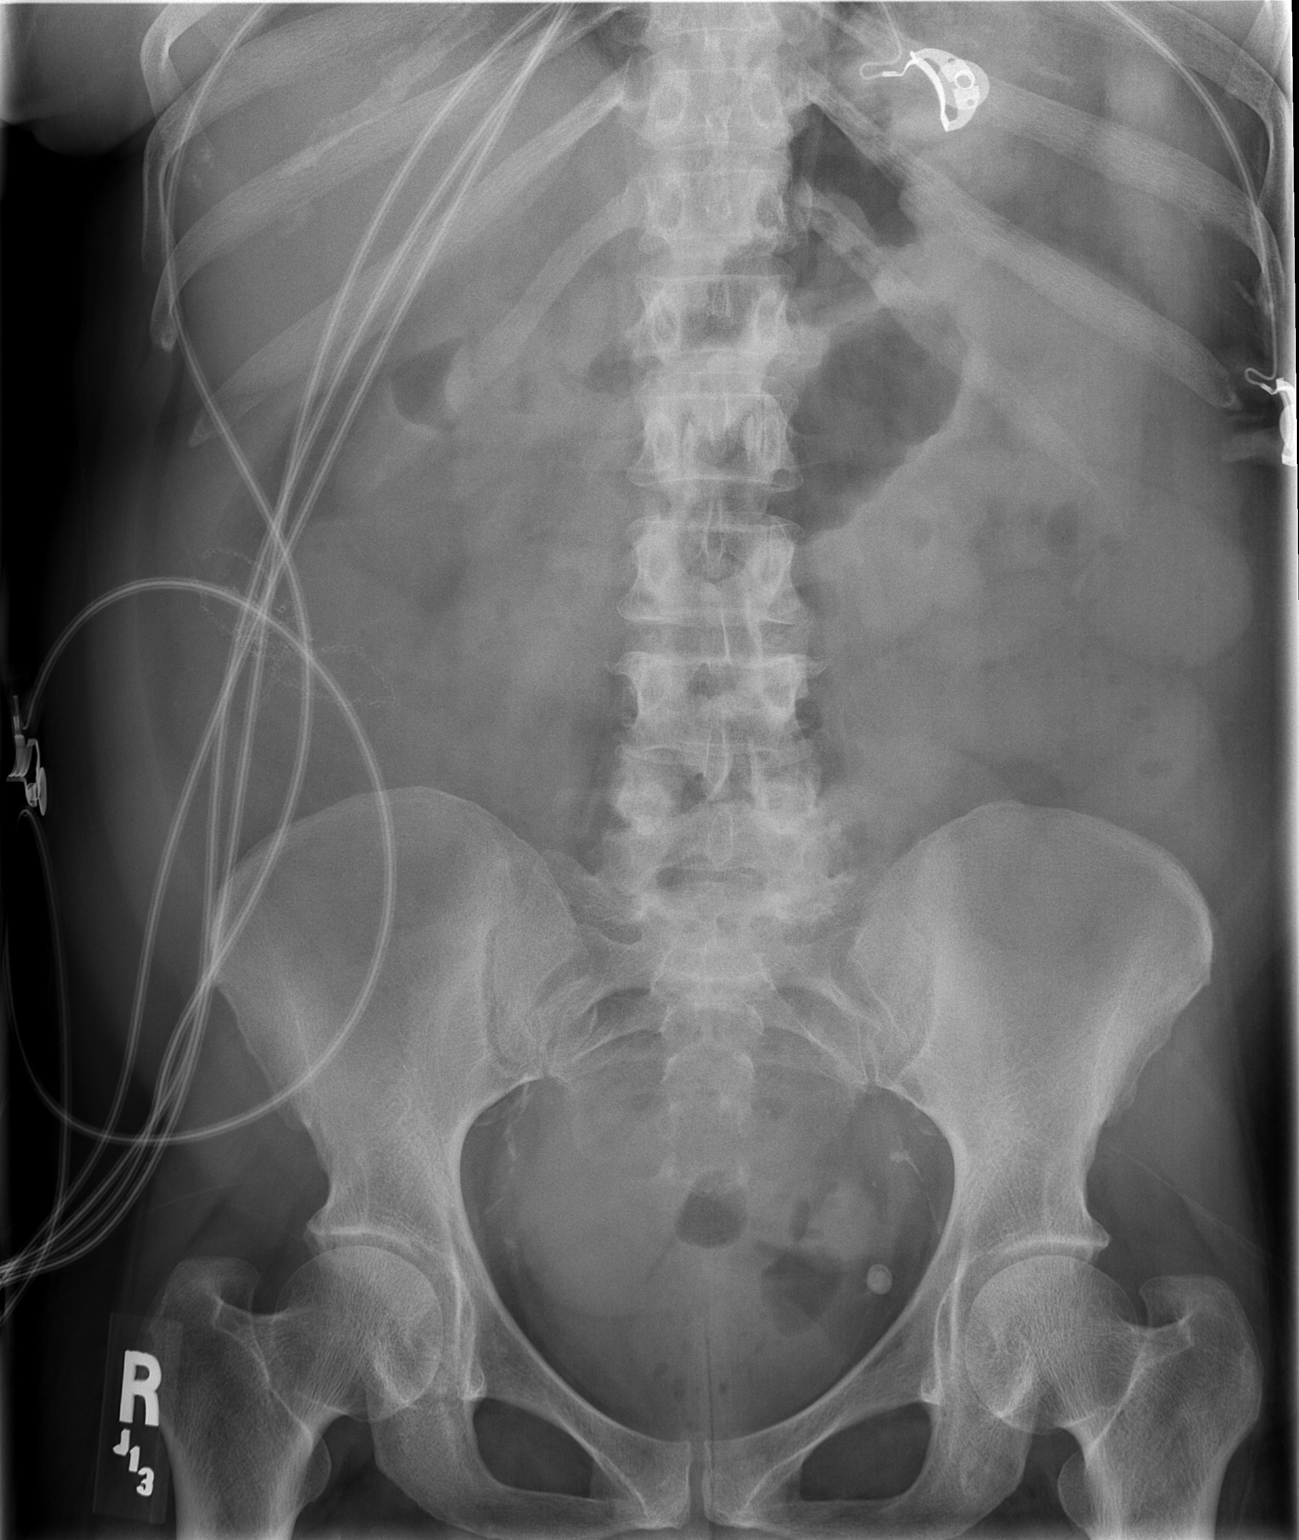

[2 of 2 positions shown; findings below may reference images not displayed]

FINDINGS: NG tube has been removed.  Slight increase in small bowel distention in the left abdomen with a diameter of 3.7 cm.  Air-fluid levels are noted on the upright exam.  Residual partial obstruction not excluded.  Postoperative sutures in the right abdomen.  Degenerative changes of the lumbosacral spine.
IMPRESSION: 1.  NG tube removal.
2.  Slight increase in small bowel distention with air-fluid levels suspicious for residual partial obstruction.
3.  No free air.

## 2009-11-18 IMAGING — CR DG ABDOMEN 1V
1 series · 1 of 1 positions shown · non-contrast
Comparison: 03/08/07.

CLINICAL DATA: Cramping, follow-up small bowel obstruction.  
 ABDOMEN ? 1 VIEW:

[t abdomen supine]
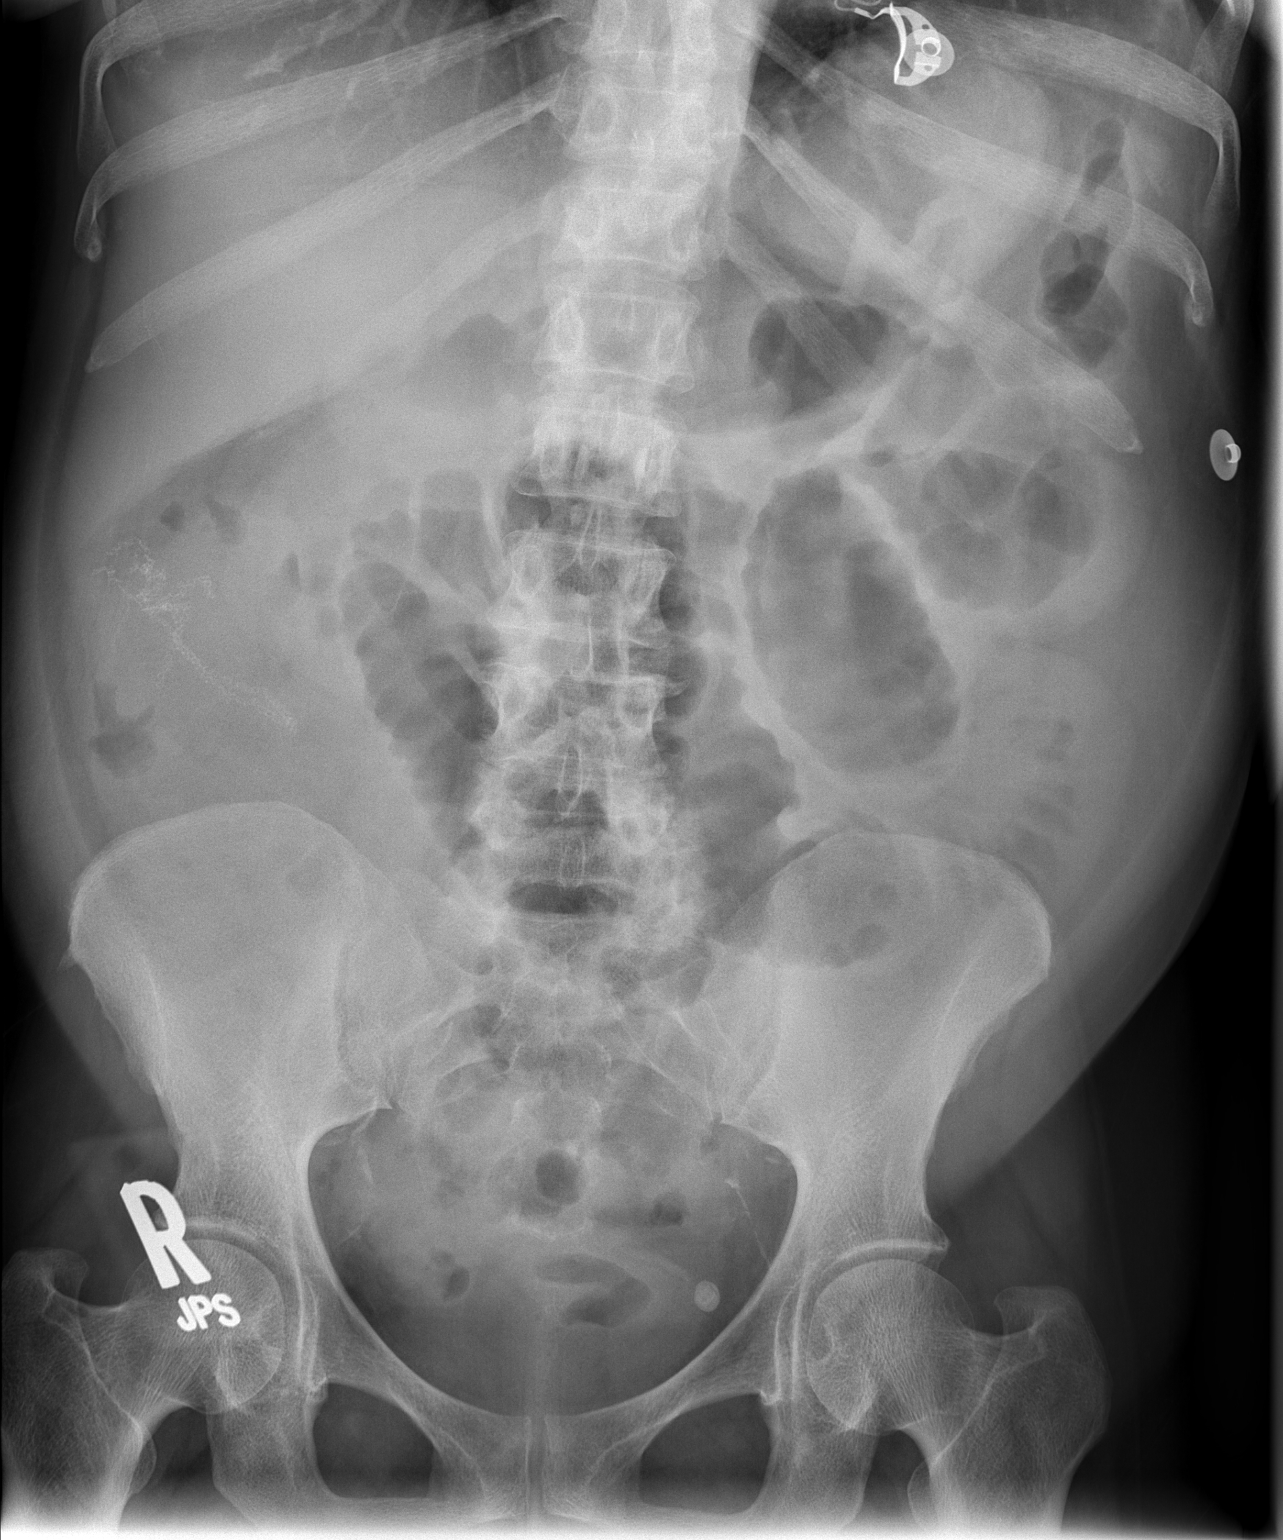

[1 of 1 positions shown; findings below may reference images not displayed]

FINDINGS: There is worsening dilatation of the proximal small bowel consistent with worsening small bowel obstruction.  No sign of free air on the supine film.
IMPRESSION: Worsening small bowel obstruction pattern.

## 2010-02-09 IMAGING — CR DG CHEST 1V PORT
1 series · 1 of 1 positions shown · non-contrast
Comparison: 02/25/2007

CLINICAL DATA: Chest pain.

PORTABLE CHEST - 1 VIEW

[AP]
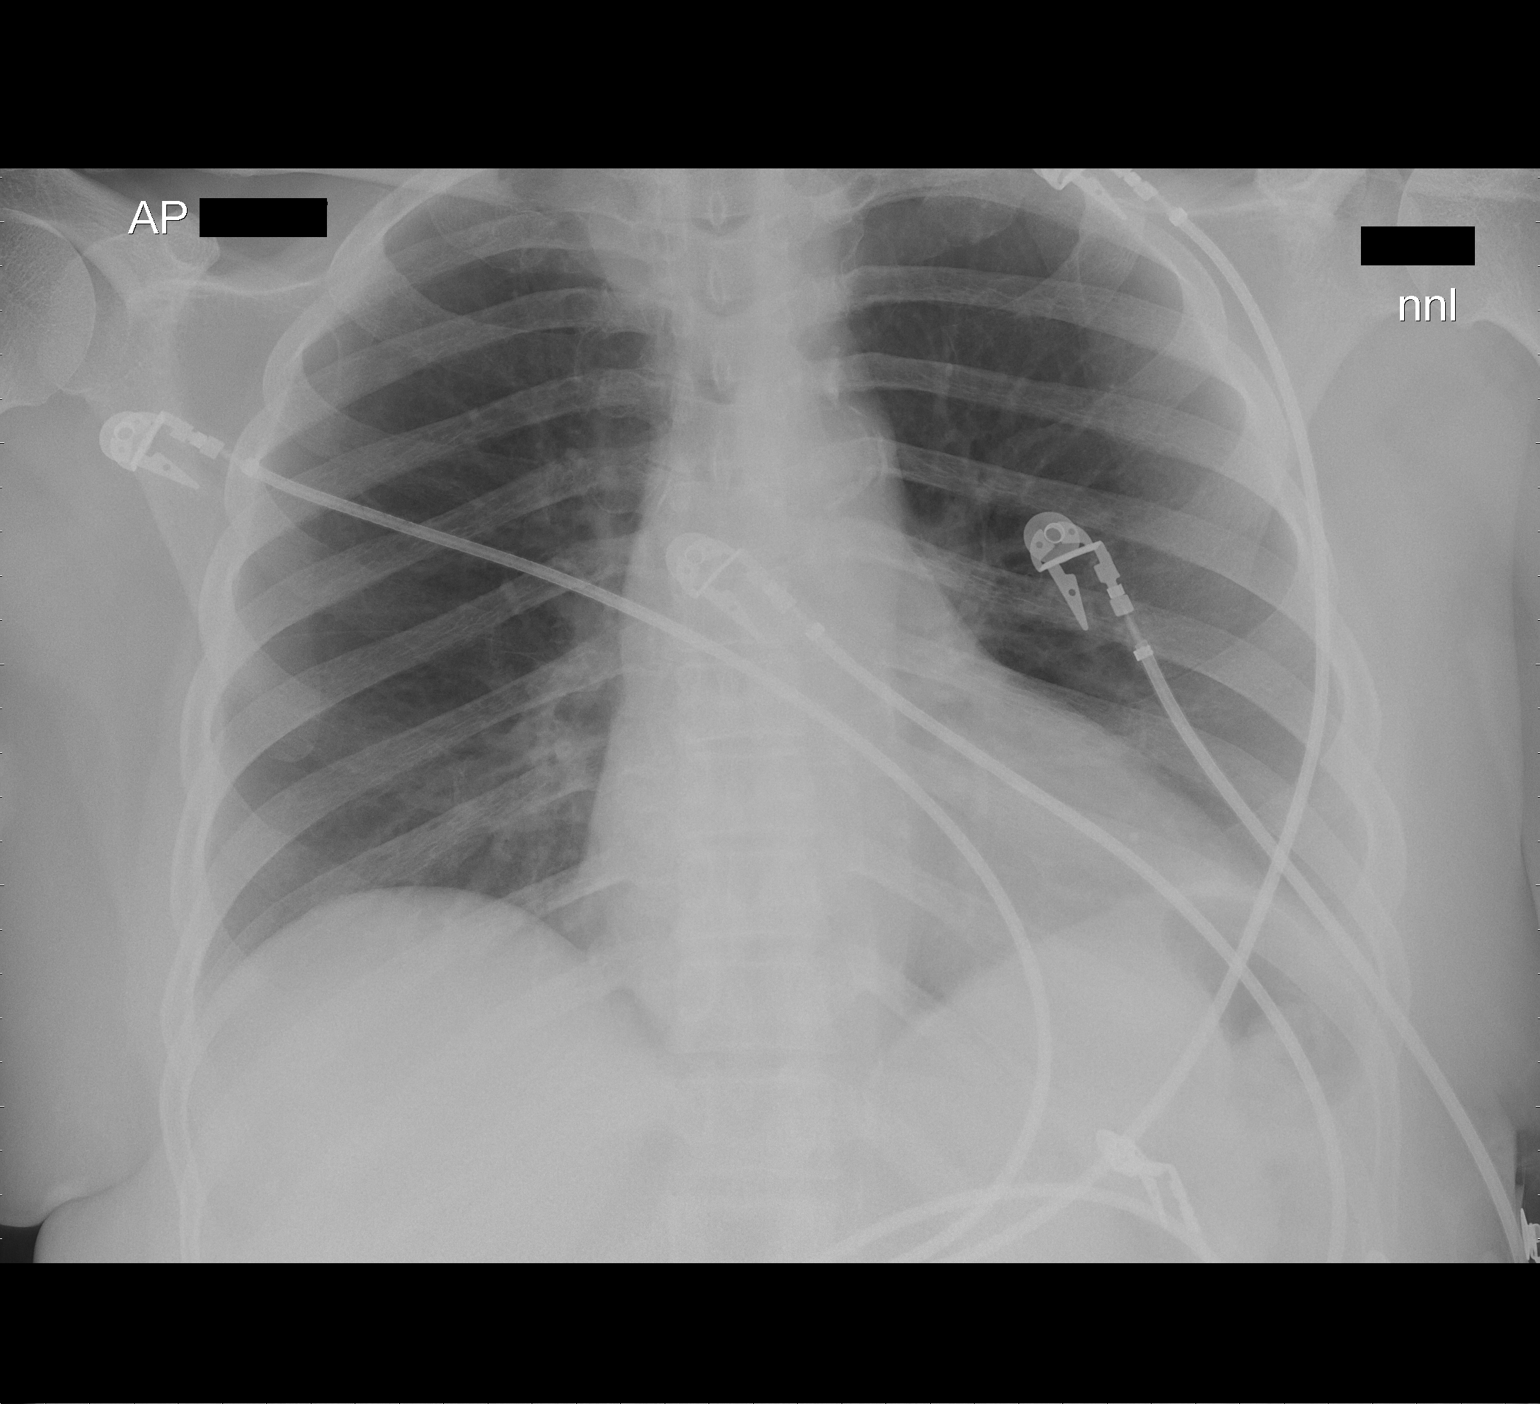

[1 of 1 positions shown; findings below may reference images not displayed]

FINDINGS: Midline trachea.  Heart size upper limits of normal.
Normal mediastinal contours.  Atherosclerotic transverse aorta.

No pleural effusion or pneumothorax.  Minimal bibasilar
atelectasis.
IMPRESSION: 1.  Borderline cardiomegaly without acute cardiopulmonary disease.

## 2010-02-26 ENCOUNTER — Encounter: Payer: Self-pay | Admitting: Cardiology

## 2010-03-07 NOTE — Letter (Signed)
Summary: Patient Notice- Polyp Results  [facility]  [address] [place], [place] [address]   Phone: [phone]  Fax: [phone]        April 26, 2009 MRN: [id number]    [name] [name], [place]  [address]    Dear Ms. Ronnald Ramp,  I am pleased to inform you that the colon polyp(s) removed during your recent colonoscopy was (were) found to be benign (no cancer detected) upon pathologic examination.Your polyps were hyperplastic ( not precancerous)  I recommend you have a repeat colonoscopy examination in _5 years to look for recurrent polyps, as having colon polyps increases your risk for having recurrent polyps or even colon cancer in the future.  Should you develop new or worsening symptoms of abdominal pain, bowel habit changes or bleeding from the rectum or bowels, please schedule an evaluation with either your primary care physician or with me.  Additional information/recommendations:  _x_ No further action with gastroenterology is needed at this time. Please      follow-up with your primary care physician for your other healthcare      needs.  __ Please call [phone] to schedule a return visit to review your      situation.  __ Please keep your follow-up visit as already scheduled.  __ Continue treatment plan as outlined the day of your exam.  Please call us if you are having persistent problems or have questions about your condition that have not been fully answered at this time.  Sincerely,  Lafayette Dragon MD  This letter has been electronically signed by your physician.  Appended Document: Patient Notice- Polyp Results Letter mailed 3.24.11

## 2010-03-07 NOTE — Letter (Signed)
Summary: Previsit letter  [facility]  [place], [place] [address]   Phone: [phone]  Fax: [phone]       02/24/2009 MRN: [id number]  [name] [address] [place], [place]  [address]  Dear Ms. Ronnald Ramp,  Welcome to the Gastroenterology Division at [facility].    You are scheduled to see a nurse for your pre-procedure visit on 03-02-09 at 10:00a.m. on the 3rd floor at [facility], [facility] [employer].  We ask that you try to arrive at our office 15 minutes prior to your appointment time to allow for check-in.  Your nurse visit will consist of discussing your medical and surgical history, your immediate family medical history, and your medications.    Please bring a complete list of all your medications or, if you prefer, bring the medication bottles and we will list them.  We will need to be aware of both prescribed and over the counter drugs.  We will need to know exact dosage information as well.  If you are on blood thinners (Coumadin, Plavix, Aggrenox, Ticlid, etc.) please call our office today/prior to your appointment, as we need to consult with your physician about holding your medication.   Please be prepared to read and sign documents such as consent forms, a financial agreement, and acknowledgement forms.  If necessary, and with your consent, a friend or relative is welcome to sit-in on the nurse visit with you.  Please bring your insurance card so that we may make a copy of it.  If your insurance requires a referral to see a specialist, please bring your referral form from your primary care physician.  No co-pay is required for this nurse visit.     If you cannot keep your appointment, please call [phone] to cancel or reschedule prior to your appointment date.  This allows [place] the opportunity to schedule an appointment for another patient in need of care.    Thank you for choosing [facility] for your medical  needs.  We appreciate the opportunity to care for you.  Please visit [place] at our website  to learn more about our practice.                     Sincerely.                                                                                                                   The Gastroenterology Division

## 2010-03-07 NOTE — Procedures (Signed)
Summary: Colonoscopy Report/[facility]  Colonoscopy Report/[facility]   Imported By: Laural Benes 04/12/2009 14:48:00  _____________________________________________________________________  External Attachment:    Type:   Image     Comment:   External Document

## 2010-03-07 NOTE — Procedures (Signed)
Summary: Colonoscopy  Patient: [name] Note: All result statuses are Final unless otherwise noted.  Tests: (1) Colonoscopy (COL)   COL Colonoscopy           DONE (C)     [facility].     [place], [place]  [address]           COLONOSCOPY PROCEDURE REPORT           PATIENT:  Dominique, [name]  MR#:  [id number]     BIRTHDATE:  August 18, 1950, 10 yrs. old  GENDER:  female           ENDOSCOPIST:  Lowella Bandy. 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  Digital rectal exam was performed and     revealed no rectal masses.   The LB PCF-H180AL [id number] endoscope     was introduced through the anus and advanced to the anastamosis,     without limitations.  The quality of the prep was adequate, using     MiraLax.  The instrument was then slowly withdrawn as the colon     was fully examined.     <<PROCEDUREIMAGES>>           FINDINGS:  The right colon was surgically resected and an     ileo-colonic anastamosis was seen (see image2 and image3).  There     were multiple polyps identified and removed. throughout the colon.     total of 6 polyps removed, mosto of them with cold biopsies, 3     polyps in the rectum, one at 15cm, one at 50 cm, one at 60 cm     Polyp was snared without cautery. Retrieval was successful. snare     polyp With hot biopsy forceps, biopsy was obtained and sent to     pathology (see image3, image5, and image6).   Retroflexed views in     the rectum revealed no abnormalities.    The scope was  then     withdrawn from the patient and the procedure completed.           COMPLICATIONS:  None           ENDOSCOPIC IMPRESSION:     1) Prior right hemi-colectomy     2) Polyps, multiple throughout the colon     RECOMMENDATIONS:     1) Await pathology results     continue Plavix           REPEAT EXAM:  In 5 year(s) for.           ______________________________     Lowella Bandy. Olevia Perches, MD           CC:  n.     REVISED:  04/25/2009 11:26 AM     eSIGNED:   Lowella Bandy. Gari Hartsell at 04/25/2009 11:26 AM           [name], [id number]  Note: An exclamation mark (!) indicates a result that was not dispersed into the flowsheet. Document Creation Date: 04/25/2009 11:26 AM _______________________________________________________________________  (1) Order result status: Final Collection or observation date-time: 04/25/2009 11:16 Requested date-time:  Receipt date-time:  Reported date-time:  Referring Physician:   Ordering Physician: Delfin Edis [phone]) Specimen Source:  Source: Tawanna Cooler Order Number: [id number] Lab site:

## 2010-03-07 NOTE — Letter (Signed)
Summary: Diabetic Instructions  [facility]  [place], [place] [address]   Phone: [phone]  Fax: [phone]    JDA HICKENBOTTOM [birth year] MRN: [id number]   _x  _   ORAL DIABETIC MEDICATION INSTRUCTIONS  The day before your procedure:   Take your diabetic pill as you do normally  The day of your procedure:   Do not take your diabetic pill    We will check your blood sugar levels during the admission process and again in Recovery before discharging you home  ________________________________________________________________________

## 2010-03-07 NOTE — Letter (Signed)
Summary: [facility] Instructions  [facility]  [place], [place] [address]   Phone: [phone]  Fax: [phone]       [name]    1950-07-08    MRN: [id number]       Procedure Day /Date: 04/25/09 Monday     Arrival Time: 9:30 am     Procedure Time: 10:30 am     Location of Procedure:                    _x _  [facility] (4th Floor)  [place]  Starting 5 days prior to your procedure 04/18/09 do not eat nuts, seeds, popcorn, corn, beans, peas,  salads, or any raw vegetables.  Do not take any fiber supplements (e.g. Metamucil, Citrucel, and Benefiber). ____________________________________________________________________________________________________   THE DAY BEFORE YOUR PROCEDURE         DATE: 04/24/09 DAY: Sunday  1   Drink clear liquids the entire day-NO SOLID FOOD  2   Do not drink anything colored red or purple.  Avoid juices with pulp.  No orange juice.  3   Drink at least 64 oz. (8 glasses) of fluid/clear liquids during the day to prevent dehydration and help the prep work efficiently.  CLEAR LIQUIDS INCLUDE: Water Jello Ice Popsicles Tea (sugar ok, no milk/cream) Powdered fruit flavored drinks Coffee (sugar ok, no milk/cream) Gatorade Juice: apple, white grape, white cranberry  Lemonade Clear bullion, consomm, broth Carbonated beverages (any kind) Strained chicken noodle soup Hard Candy  4   Mix the entire bottle of Miralax with 64 oz. of Gatorade/Powerade in the morning and put in the refrigerator to chill.  5   At 3:00 pm take 2 Dulcolax/Bisacodyl tablets.  6   At 4:30 pm take one Reglan/Metoclopramide tablet.  7  Starting at 5:00 pm drink one 8 oz glass of the Miralax mixture every 15-20 minutes until you have finished drinking the entire 64 oz.  You should finish drinking prep around 7:30 or 8:00 pm.  8   If you are nauseated, you may take the 2nd Reglan/Metoclopramide tablet at  6:30 pm.        9    At 8:00 pm take 2 more DULCOLAX/Bisacodyl tablets.        THE DAY OF YOUR PROCEDURE      DATE:  04/25/09 DAY: Monday  You may drink clear liquids until 8:30 am (2 HOURS BEFORE PROCEDURE).   MEDICATION INSTRUCTIONS  Unless otherwise instructed, you should take regular prescription medications with a small sip of water as early as possible the morning of your procedure.  Continue taking your Plavix for your procedure per Dr. Delfin Edis.         OTHER INSTRUCTIONS  You will need a responsible adult at least 62 years of age to accompany you and drive you home.   This person must remain in the waiting room during your procedure.  Wear loose fitting clothing that is easily removed.  Leave jewelry and other valuables at home.  However, you may wish to bring a book to read or an iPod/MP3 player to listen to music as you wait for your procedure to start.  Remove all body piercing jewelry and leave at home.  Total time from sign-in until discharge is approximately 2-3 hours.  You should go home directly after your procedure and rest.  You can resume normal activities the day after your procedure.  The day  of your procedure you should not:   Drive   Make legal decisions   Operate machinery   Drink alcohol   Return to work  You will receive specific instructions about eating, activities and medications before you leave.   The above instructions have been reviewed and explained to me by   [place] Deborra Medina)  April 13, 2009 12:14 PM    I fully understand and can verbalize these instructions _____________________________ Date 04/13/09

## 2010-03-07 NOTE — Consult Note (Signed)
Summary: Perforation s/p Polypectomy  NAME:  [name], [name]                ACCOUNT NO.:  [id number]      MEDICAL RECORD NO.:  [id number]          PATIENT TYPE:  INP      LOCATION:  6529                         FACILITY:  [facility]      PHYSICIAN:  Merri Ray. Grandville Silos, M.D.DATE OF BIRTH:  1950-03-21      DATE OF CONSULTATION:   DATE OF DISCHARGE:                                    CONSULTATION      REASON FOR CONSULTATION:  Perforation status post polypectomy.      HISTORY OF PRESENT ILLNESS:  [name] is a very pleasant 59 year old   African American female with a history of coronary artery disease who   was admitted with chest pain on February 17, 2007.  The patient was set   to undergo cardiac catheterization by Dr. Terrence Dupont but she was noted to   have a drop in her hemoglobin to approximately 8.8.  She did report   history of melanoma and stools were heme positive.  GI consult was   obtained.  She was seen by Dr. Carol Ada.  Her catheterization was   cancelled.  Dr. Benson Norway did esophagogastroduodenoscopy on January 13 which   showed no acute significant findings.  The patient later underwent   colonoscopy on February 20, 2007.  Multiple polypectomies were done   including a piecemeal polypectomy of a 3-cm sessile polyp in the   ascending colon 3-4 cm from the cecum.  The patient developed   significant abdominal pain after the procedure.  However, abdominal   films at that time showed no free air.  The patient's white blood cell   count after the procedure was 13.1 and this has decreased to 10.3 but   she has continued to have significant abdominal pain since that time.   CT scan of the abdomen and pelvis was done this morning showing some   ascending colon stranding and localized free air consistent with a   perforation.  We are asked to see her for further evaluation of this   problem.      PAST MEDICAL HISTORY:  Includes:   1. Coronary artery disease.   2. Myocardial infarction.   3. Coronary stent placement.   4. Hypertension.   5. Diabetes mellitus.   6. Hyperlipidemia.   7. GERD.      PAST SURGICAL HISTORY:  Tubal ligation.      SOCIAL HISTORY:  She lives alone.  She is divorced.  She drinks alcohol   socially.      ALLERGIES:  No known drug allergies.      FAMILY HISTORY:  Coronary artery disease.      MEDICATIONS:  Please refer to the [facility] but these include aspirin and   Plavix as she has had drug-eluting cardiac stents within the past 12   months.      REVIEW OF SYSTEMS:  GI SYSTEM:  Review is as above.  She also notes   recent melena prior to admission.  CARDIOVASCULAR SYSTEM:  She has no   current chest  pain at this time.  PULMONARY:  Negative.  Remainder of   the review of systems unremarkable.      PHYSICAL EXAMINATION TODAY:  Temperature is 98.9.  Maximum temperature   over the past 24 hours was 100.1.  Blood pressure 124/53, pulse 82,   respirations 20.   GENERAL:  She is in no acute distress.   NEUROLOGIC:  She is awake and alert.  She follows commands and moves all   extremities.   NECK:  Supple with no tenderness or masses.   LUNGS:  Clear to auscultation.  No significant wheezing is present.   CARDIOVASCULAR:  Heart is regular.  Impulse is palpable in the left   chest.   ABDOMEN:  Distended and tender on the right side greater than the left   side with guarding present on the right side.  A few bowel sounds are   present.  No masses are palpated.  She has a Flexi-Seal in place over   her anus.   SKIN:  Warm and dry.  No rashes are noted.   EXTREMITIES:  No significant edema.      DATA REVIEW:  Includes white blood cell count 10.3, hemoglobin 8.9.   BMET shows hypokalemia that Dr. Terrence Dupont is replacing.      IMPRESSION:   1. Right colon perforation status post polypectomy.   2. Multiple medical problems.   3. Antiplatelet therapy currently.      PLAN:  We will transfuse platelets.  We will type and cross for 2 units   of packed  red blood cells.  We will take her to the operating room   emergently today for a right colectomy.  Procedure risks and benefits   were discussed in detail with the patient.  Questions were answered and   she is agreeable.               Merri Ray Grandville Silos, M.D.   Electronically Signed            BET/MEDQ  D:  02/22/2007  T:  02/22/2007  Job:  [id number]      cc:   Tory Emerald. Benson Norway, MD   Allegra Lai. Terrence Dupont, M.D.

## 2010-03-07 NOTE — Assessment & Plan Note (Signed)
Summary: hosp f.u...em   History of Present Illness Visit Type: Follow-up Visit Primary GI MD: Delfin Edis MD Primary Provider: Dr. Terrence Dupont [facility]) Requesting Provider: n/a Chief Complaint: Hosp f/u for abd pain.  Pt c/o of upper abd pain. Pt wants to try Nexium again History of Present Illness:   This is a 60 year old African American female who is status post hospitalization on 04/26/09 for abdominal pain following a colonoscopy. 6 polyps were removed at the time of colonoscopy. She has a history of a cecal polypectomy in 2009 with post-polypectomy perforation requiring a right hemicolectomy. She also had an upper endoscopy in January 2009 which was a normal exam. She has been on Plavix. She has asymptomatic cholelithiasis with a common bile duct measuring at 3 mm. Other medical history is significant for DJD of the lumbosacral spine, non-Q wave myocardial infarction, type II diabetes, hyperlipidemia and anemia. She comes today complaining of epigastric pain with epigastric  discomfort which did not respond to famotidine 20 mg twice a day but was helped with Nexium 40 mg daily.   GI Review of Systems      Denies abdominal pain, acid reflux, belching, bloating, chest pain, dysphagia with liquids, dysphagia with solids, heartburn, loss of appetite, nausea, vomiting, vomiting blood, weight loss, and  weight gain.        Denies anal fissure, black tarry stools, change in bowel habit, constipation, diarrhea, diverticulosis, fecal incontinence, heme positive stool, hemorrhoids, irritable bowel syndrome, jaundice, light color stool, liver problems, rectal bleeding, and  rectal pain.    Current Medications (verified): 1)  Aspirin 81 Mg  Tabs (Aspirin) .... One Tablet By Mouth Once Daily 2)  Simvastatin 20 Mg Tabs (Simvastatin) .... One Tablet By Mouth Once Daily 3)  Avapro 150 Mg Tabs (Irbesartan) .... One Tablet By Mouth Once Daily 4)  Metoprolol Tartrate 25 Mg Tabs (Metoprolol Tartrate)  .... One Tablet By Mouth Once Daily 5)  Metformin Hcl 500 Mg Tabs (Metformin Hcl) .... One Tablet By Mouth Two Times A Day 6)  Amlodipine Besylate 5 Mg Tabs (Amlodipine Besylate) .... One Tablet By Mouth Once Daily 7)  Famotidine 20 Mg Tabs (Famotidine) .... One Tablet By Mouth Two Times A Day 8)  Plavix 75 Mg Tabs (Clopidogrel Bisulfate) .... One Tablet By Mouth Once Daily 9)  Nitrostat 0.4 Mg Subl (Nitroglycerin) .... As Needed 10)  Tramadol Hcl 50 Mg Tabs (Tramadol Hcl) .... One Tablet By Mouth As Needed For Pain  Allergies (verified): No Known Drug Allergies  Past History:  Past Medical History: Reviewed history from 04/08/2009 and no changes required. Current Problems:  COLONIC POLYPS, HX OF (ICD-V12.72) GERD (ICD-530.81) HYPERLIPIDEMIA (ICD-272.4) DM (ICD-250.00) HYPERTENSION (ICD-401.9) MYOCARDIAL INFARCTION (ICD-410.90) CORONARY ARTERY DISEASE (ICD-414.00) FATTY LIVER DISEASE (ICD-571.8)    Past Surgical History: Reviewed history from 04/08/2009 and no changes required. Right colectomy after colonic perforation Tubal Ligation  Family History: Reviewed history from 04/13/2009 and no changes required. Family History of Heart Disease: Mother  Family History of Diabetes: Mother and Sister  No FH of Colon Cancer:  Social History: Reviewed history from 04/13/2009 and no changes required. Unemployed Divorced 2 childern Patient currently smokes.  Alcohol Use - no Illicit Drug Use - no Daily Caffeine Use: 2 daily   Review of Systems       The patient complains of fatigue.  The patient denies allergy/sinus, anemia, anxiety-new, arthritis/joint pain, back pain, blood in urine, breast changes/lumps, change in vision, confusion, cough, coughing up blood, depression-new, fainting, fever, headaches-new,  hearing problems, heart murmur, heart rhythm changes, itching, menstrual pain, muscle pains/cramps, night sweats, nosebleeds, pregnancy symptoms, shortness of breath, skin  rash, sleeping problems, sore throat, swelling of feet/legs, swollen lymph glands, thirst - excessive , urination - excessive , urination changes/pain, urine leakage, vision changes, and voice change.         Pertinent positive and negative review of systems were noted in the above HPI. All other ROS was otherwise negative.   Vital Signs:  Patient profile:   60 year old female Height:      59 inches Weight:      131 pounds BMI:     26.55 BSA:     1.54 Pulse rate:   60 / minute Pulse rhythm:   regular BP sitting:   132 / 76  (left arm) Cuff size:   regular  Vitals Entered By: Hope Pigeon CMA (Jun 13, 2009 2:03 PM)  Physical Exam  General:  Well developed, well nourished, no acute distress. Eyes:  PERRLA, no icterus. Mouth:  No deformity or lesions, dentition normal. Neck:  Supple; no masses or thyromegaly. Lungs:  Clear throughout to auscultation. Heart:  Regular rate and rhythm; no murmurs, rubs,  or bruits. Abdomen:  very tender epigastric and subxiphoid area in the midline without radiation to the right upper quadrant. Lower abdomen unremarkable. Extremities:  No clubbing, cyanosis, edema or deformities noted. Skin:  Intact without significant lesions or rashes. Psych:  Alert and cooperative. Normal mood and affect.   Impression & Recommendations:  Problem # 1:  COLONIC POLYPS, HX OF (ICD-V12.72) status post recent colonoscopy and polypectomy. Recall colonoscopy in 3 years  Problem # 2:  GERD (ICD-530.81) Patient has gastroesophageal reflux and epigastric pain with a normal upper endoscopy in January 2009. The reflux was relieved by Nexium. Patient received samples of Nexium and we will also send her a prescription for Nexium 40 mg daily. If the symptoms continue, we will consider repeating the upper endoscopy.  Patient Instructions: 1)  Nexium 40 mg daily in place of famotidine. 2)  Stop smoking. 3)  If the symptoms continue, consider upper endoscopy. 4)  A recall  colonoscopy March 2014. 5)  Copy sent to : Dr Terrence Dupont 6)  The medication list was reviewed and reconciled.  All changed / newly prescribed medications were explained.  A complete medication list was provided to the patient / caregiver. Prescriptions: NEXIUM 40 MG CPDR (ESOMEPRAZOLE MAGNESIUM) Take 1 tablet by mouth once a day  #90 x 1   Entered by:   Madlyn Frankel CMA (AAMA)   Authorized by:   Lafayette Dragon MD   Signed by:   [place] ([place]) on 06/13/2009   Method used:   Faxed to ...       [facility] (mail-order)       [address] [place], [place]  [address]       Ph: [phone]       Fax: [phone]   RxID:   [id number]

## 2010-03-07 NOTE — Assessment & Plan Note (Signed)
Summary: needs colon on Plavix--ch.   History of Present Illness Visit Type: new patient  Primary GI MD: Delfin Edis MD Primary Provider: [facility] Requesting Provider: n/a Chief Complaint: Lower abd pain, belching, chest pain, loss of appetite, constipation and diarrhea  History of Present Illness:   This is a 60 year old [place] American female who is here to discuss having a recall colonoscopy. She has a history of adenomatous polyps of the colon and is status post cecal polypectomy in January 2009 which resulted in a colon perforation requiring an emergency right hemicolectomy. She had a normal upper endoscopy in January 2009. She has a history of abnormal liver function tests which turned out to be normal when repeated. There is a history of coronary artery disease. She is status post myocardial infarction and PCI. She has high blood pressure, diabetes and increased lipids. She is on Plavix because of prior placement of a cardiac stent. Patient is complaining of epigastric pain and periumbilical discomfort. Her bowel habits have been regular and there has been no rectal bleeding.   GI Review of Systems    Reports abdominal pain, belching, chest pain, loss of appetite, nausea, and  vomiting.     Location of  Abdominal pain: lower abdomen.    Denies acid reflux, bloating, dysphagia with liquids, dysphagia with solids, heartburn, vomiting blood, weight loss, and  weight gain.      Reports constipation and  diarrhea.     Denies anal fissure, black tarry stools, change in bowel habit, diverticulosis, fecal incontinence, heme positive stool, hemorrhoids, irritable bowel syndrome, jaundice, light color stool, liver problems, rectal bleeding, and  rectal pain.    Current Medications (verified): 1)  Aspirin 81 Mg  Tabs (Aspirin) .... One Tablet By Mouth Once Daily 2)  Simvastatin 20 Mg Tabs (Simvastatin) .... One Tablet By Mouth Once Daily 3)  Avapro 150 Mg Tabs (Irbesartan) .... One Tablet By  Mouth Once Daily 4)  Metoprolol Tartrate 25 Mg Tabs (Metoprolol Tartrate) .... One Tablet By Mouth Once Daily 5)  Metformin Hcl 500 Mg Tabs (Metformin Hcl) .... One Tablet By Mouth Two Times A Day 6)  Amlodipine Besylate 5 Mg Tabs (Amlodipine Besylate) .... One Tablet By Mouth Once Daily 7)  Famotidine 20 Mg Tabs (Famotidine) .... One Tablet By Mouth Two Times A Day 8)  Plavix 75 Mg Tabs (Clopidogrel Bisulfate) .... One Tablet By Mouth Once Daily 9)  Nitrostat 0.4 Mg Subl (Nitroglycerin) .... As Needed 10)  Tramadol Hcl 50 Mg Tabs (Tramadol Hcl) .... One Tablet By Mouth As Needed For Pain  Allergies (verified): No Known Drug Allergies  Past History:  Past Medical History: Reviewed history from 04/08/2009 and no changes required. Current Problems:  COLONIC POLYPS, HX OF (ICD-V12.72) GERD (ICD-530.81) HYPERLIPIDEMIA (ICD-272.4) DM (ICD-250.00) HYPERTENSION (ICD-401.9) MYOCARDIAL INFARCTION (ICD-410.90) CORONARY ARTERY DISEASE (ICD-414.00) FATTY LIVER DISEASE (ICD-571.8)    Past Surgical History: Reviewed history from 04/08/2009 and no changes required. Right colectomy after colonic perforation Tubal Ligation  Family History: Family History of Heart Disease: Mother  Family History of Diabetes: Mother and Sister  No FH of Colon Cancer:  Social History: Unemployed Divorced 2 childern Patient currently smokes.  Alcohol Use - no Illicit Drug Use - no Daily Caffeine Use: 2 daily   Review of Systems       The patient complains of allergy/sinus, arthritis/joint pain, back pain, cough, depression-new, fatigue, headaches-new, heart rhythm changes, muscle pains/cramps, nosebleeds, shortness of breath, sleeping problems, and swelling of feet/legs.  The patient  denies anemia, anxiety-new, blood in urine, breast changes/lumps, change in vision, confusion, coughing up blood, fainting, fever, hearing problems, heart murmur, itching, menstrual pain, night sweats, pregnancy symptoms, skin  rash, sore throat, swollen lymph glands, thirst - excessive , urination - excessive , urination changes/pain, urine leakage, vision changes, and voice change.         Pertinent positive and negative review of systems were noted in the above HPI. All other ROS was otherwise negative.   Vital Signs:  Patient profile:   60 year old female Height:      59 inches Weight:      138 pounds BMI:     27.97 BSA:     1.58 Pulse rate:   60 / minute Pulse rhythm:   regular BP sitting:   128 / 74  (left arm) Cuff size:   regular  Vitals Entered By: Hope Pigeon CMA (April 13, 2009 11:34 AM)  Physical Exam  General:  alert and oriented. Eyes:  PERRLA, no icterus. Mouth:  No deformity or lesions, dentition normal. Neck:  Supple; no masses or thyromegaly. Lungs:  Clear throughout to auscultation. Heart:  Regular rate and rhythm; no murmurs, rubs,  or bruits. Abdomen:  protuberant abdomen with normoactive bowel sounds. Tenderness in epigastrium. Well-healed surgical scar below the umbilicus. No palpable mass. No rebound. No ascites. Extremities:  No clubbing, cyanosis, edema or deformities noted. Skin:  Intact without significant lesions or rashes. Psych:  Alert and cooperative. Normal mood and affect.   Impression & Recommendations:  Problem # 1:  COLONIC POLYPS, HX OF (ICD-V12.72) Patinet has a history of adenomatous polyps of the right colon which were removed in January 2009. She is status post colon perforation and emergency right hemicolectomy. She is doing well now but needs a recall colonoscopy. Patient is on Plavix for her coronary artery disease and is status post cariac stent placement. She needs to stay on Plavix during the procedure.  Orders: Colonoscopy (Colon)  Problem # 2:  GERD (ICD-530.81) Patient has epigastric discomfort on famotidine 20 mg twice a day. We will give her samples of Nexium 40 mg daily to take for 2 weeks.  Patient Instructions: 1)  colonoscopy using routine  colonoscopy prep. 2)  Nexium samples 40 mg daily x 2 weeks. 3)  Continue Plavix throughout the colonoscopy. 4)  Copy sent to : [facility] 5)  The medication list was reviewed and reconciled.  All changed / newly prescribed medications were explained.  A complete medication list was provided to the patient / caregiver. Prescriptions: DULCOLAX 5 MG  TBEC (BISACODYL) Day before procedure take 2 at 3pm and 2 at 8pm.  #4 x 0   Entered by:   Awilda Bill CMA ([facility])   Authorized by:   Lafayette Dragon MD   Signed by:   Awilda Bill CMA ([place]) on 04/13/2009   Method used:   Print then Give to Patient   RxID:   [id number] REGLAN 10 MG  TABS (METOCLOPRAMIDE HCL) As per prep instructions.  #2 x 0   Entered by:   Awilda Bill CMA (AAMA)   Authorized by:   Lafayette Dragon MD   Signed by:   Awilda Bill CMA ([place]) on 04/13/2009   Method used:   Print then Give to Patient   RxID:   [id number] MIRALAX   POWD (POLYETHYLENE GLYCOL 3350) As per prep  instructions.  #255gm x 0   Entered by:   Awilda Bill CMA (AAMA)  Authorized by:   Lafayette Dragon MD   Signed by:   Awilda Bill CMA ([place]) on 04/13/2009   Method used:   Print then Give to Patient   RxID:   [id number]

## 2010-03-07 NOTE — Op Note (Signed)
Summary: Right Colectomy  NAME:  [name], [name]                ACCOUNT NO.:  [id number]      MEDICAL RECORD NO.:  [id number]           PATIENT TYPE:      LOCATION:                                 FACILITY:      PHYSICIAN:  Merri Ray. Grandville Silos, M.D.DATE OF BIRTH:  Jan 12, 1951      DATE OF PROCEDURE:  02/22/2007   DATE OF DISCHARGE:                                  OPERATIVE REPORT      PREOPERATIVE DIAGNOSIS:  Right colon perforation.      POSTOPERATIVE DIAGNOSIS:  Right colon perforation.      PROCEDURE PERFORMED:  Right colectomy.      SURGEON:  Merri Ray. Grandville Silos, M.D.      ASSISTANT:  Adin Hector, MD      HISTORY OF PRESENT ILLNESS:  [name] is a 60 year old African-American   female with a history of significant coronary artery disease who was   admitted with chest pain on 02/17/2007.  She subsequently was noted to   be anemic with hemoccult-positive stools.  Workup included an upper   endoscopy and colonoscopy.  Multiple polyps were removed from her cecum   and right colon during colonoscopy on 02/20/2007.  She has had some   abdominal pain subsequently.  Initial plain x-rays did not show any free   air but she underwent CT scan of the abdomen and pelvis this morning   showing a perforation of her right colon.  She is brought emergently to   the operating room.  She received platelet transfusion as she has been   on aspirin and Plavix.      PROCEDURE IN DETAIL:  Informed consent was obtained.  The patient was   identified in the preoperative holding area.  Her site was marked.  She   received intravenous antibiotics.  She was brought to the operating   room.  General endotracheal anesthesia was administered by the   anesthesia staff.  Her abdomen was prepped and draped in a sterile   fashion.  A midline incision was made.  Subcutaneous tissues were   dissected down, revealing the fascia.  The fascia was divided along the   midline superior to the umbilicus due to her  lower midline scar from a   tubal ligation.  Peritoneal cavity was entered under direct vision   without difficulty.  The fascia was opened to the length of the   incision.  Exploration revealed approximately 1 cm perforation in her   right colon. There was some attempt of the appendices epiploica and   nearby bowel to stick to that area; however there was a small amount of   contamination.  Further exploration of the area revealed that the degree   of inflammation and size of the hold precluded primary repair.  Decision   was made to do right colectomy.      The right colon was mobilized from its lateral peritoneal attachments   along the line of Toldt using Bovie cautery and achieving excellent   hemostasis.  The  right colon was mobilized up into the wound.  The   duodenum was protected and swept down.  The mobilization included the   hepatic flexure.  Once this was accomplished, the terminal ileum was   divided with a GI-75 stapler and proximal transverse colon was divided   with a GI-75 stapler and the mesentery of the right colon was divided   with the LigaSure, achieve excellent hemostasis.  We also used a figure-   of-eight 2-0 silk LigaSure on the right colonic vessel.  Excellent   hemostasis was obtained.  The specimen was passed off and sent to   pathology. The patient remained hemodynamically stable and there was not   an overwhelming amount of contamination and her bowel had been prepped   previously.  The decision was made to go on and do an anastomosis.      Side-to-side anastomosis was done from the ileum to the proximal   transversa colon.  First a tacking stitch of 2-0 silk was placed.  Side-   to-side anastomosis was accomplished with a GI-75 stapler.  The staple   line was checked inside the bowel and there was no bleeding. The   resultant enterotomy was closed with TA-60, achieving excellent closure.   Some interrupted 3-0 silk were placed to get excellent hemostasis  along   the staple line.  The anastomosis was widely patent.  A second cross   stitch was placed of 2-0 silk.  We changed our gloves.      The rent in the mesentery was closed with interrupted figure-of-eight 2-   0 silk sutures.  Anastomosis remained patent and bowel appeared pink and   viable.  The abdomen was copiously irrigated with several liters of warm   saline.  The bowel was returned to anatomic position.  The right upper   quadrant was re-inspected and hemostasis was assured.  The NG tube was   positioned correctly in the stomach.  The omentum was brought back down   over the bowel and the abdomen was closed.      The fascia was first closed from each end with #1 looped PDS and tied in   the middle.  Subcutaneous tissues were copiously irrigated and the skin   was closed with staples.  Sponge, needle and instrument counts were   correct.  Sterile dressings were applied.  The patient tolerated the   procedure well without apparent complication and was taken to the   recovery room in stable condition.               Merri Ray Grandville Silos, M.D.   Electronically Signed            BET/MEDQ  D:  02/22/2007  T:  02/22/2007  Job:  [id number]      cc:   Allegra Lai. Terrence Dupont, M.D.   Tory Emerald Benson [place], MD

## 2010-03-07 NOTE — Miscellaneous (Signed)
Summary: [facility] PV  Clinical Lists Changes   Pt. came for PV today.  Pt is poor historian; she refers to a surgery that she had "on her colon" but was not able to tell me who performed her surgery.  She says that she was in the hospital for 1 month after surgery.  She is also on Plavix.  Colonoscopy for 03/15/2009 cancelled and pt scheduled for OV with Dr. Olevia Perches.

## 2010-03-07 NOTE — Progress Notes (Signed)
  Phone Note Call from Patient   Summary of Call: ON CALL [place]. TELEPHONE # LISTED AS PRIMARY # REACHING CAREGIVER, Berniece Pap. MS Zenovia Jordan THE PT. CALLED HER WITH C/O ABD PAIN AND VOMITING SINCE LAST EVENING.  MS WILKERSON THEN PLACED PT. ON TELEPHONE. PT. CO ABD PAIN 5/10, DULL WITH VOMITING AND CONTINUED B.MS. PT. STATING SHE HAS CHILLS AND UNSURE IF SHE HAS A FEVER.  CONSULTED D. DOSS, RN TEAM LEADER.  PAGED NURSE PRACTIONER, PAULA GUENTHER. RETURN CALL TO PT., PT. NOW CO CHEST PAIN. INSTRUCTED CAREGIVER, [employer] CALL 911 TO TAKE PT. TO [place]. [place] ON FOLLOW THROUGH AND PT. CONDITION. CAREGIVER THEN REVEALED SHE WAS NOT WITH THE PT. PREVIOUS CALLS PATCHED THROUGH AS A THREE WAY CONVERSATION. CAREGIVER STATING SHE WAS IN ROUTE TO PT.'S HOME TO TAKE HER TO ED. INFORMED CAREGIVER THAT CHEST PAIN IS A MEDICAL EMERGENCY AND OUR SUGGESTION WAS TO ACTIVATE EMS.CAREGIVER STATING PT. REFUSES AN AMBULANCE TRANSPORT AND IS AFRAID OF [place] TELEPHONE # GIVEN FOR PT.'S HOME, [id number]. CALLED PT.'S HOME SPOKE WITH PT. STILL CO CHEST PAIN AND ABD PAIN 5/10.PT STATING HER CAREGIVER IS COMING TO TAKE HER TO ED. INFORMED PT TO CALL 911, PT. STATING YES MA'M 0825 CALL BACK FROM MS GUENTHER,NP INFORMED OF PT.'S CONDITION AND INSTRUCTION TO GO TO ED AT [place] PER 911.  Initial call taken by: Margie Ege,  April 26, 2009 8:53 AM

## 2010-03-07 NOTE — Letter (Signed)
Summary: New Patient letter  [facility]  [address] [place], [place] [address]   Phone: [phone]  Fax: [phone]       03/02/2009 MRN: [id number]  [name] [place], [place]  [address]  Dear [name],  Welcome to the Gastroenterology Division at [facility].    You are scheduled to see Dr.  Olevia Perches on 04-13-09 at 10:30a.m. on the 3rd floor at [facility] [place] [employer].  We ask that you try to arrive at our office 15 minutes prior to your appointment time to allow for check-in.  We would like you to complete the enclosed self-administered evaluation form prior to your visit and bring it with you on the day of your appointment.  We will review it with you.  Also, please bring a complete list of all your medications or, if you prefer, bring the medication bottles and we will list them.  Please bring your insurance card so that we may make a copy of it.  If your insurance requires a referral to see a specialist, please bring your referral form from your primary care physician.  Co-payments are due at the time of your visit and may be paid by cash, check or credit card.     Your office visit will consist of a consult with your physician (includes a physical exam), any laboratory testing he/she may order, scheduling of any necessary diagnostic testing (e.g. x-ray, ultrasound, CT-scan), and scheduling of a procedure (e.g. Endoscopy, Colonoscopy) if required.  Please allow enough time on your schedule to allow for any/all of these possibilities.    If you cannot keep your appointment, please call [phone] to cancel or reschedule prior to your appointment date.  This allows [place] the opportunity to schedule an appointment for another patient in need of care.  If you do not cancel or reschedule by 5 p.m. the business day prior to your appointment date, you will be charged a $50.00 late cancellation/no-show fee.    Thank you for choosing  [facility] for your medical needs.  We appreciate the opportunity to care for you.  Please visit [place] at our website  to learn more about our practice.                     Sincerely,                                                             The Gastroenterology Division

## 2010-03-07 NOTE — Discharge Summary (Signed)
Summary: ANGINA, CAD  NAME:  [name], [name]                ACCOUNT NO.:  [id number]      MEDICAL RECORD NO.:  [id number]          PATIENT TYPE:  INP      LOCATION:  3703                         FACILITY:  [facility]      PHYSICIAN:  Mohan N. Terrence Dupont, M.D. DATE OF BIRTH:  05-14-1950      DATE OF ADMISSION:  10/16/2008   DATE OF DISCHARGE:  10/19/2008                                  DISCHARGE SUMMARY      ADMITTING DIAGNOSES:   1. Unstable angina, rule out myocardial infarction.   2. Coronary artery disease.   3. History of percutaneous coronary intervention to right coronary       artery and left circumflex.   4. Hypertension.   5. Type 2 diabetes mellitus.   6. Gastroesophageal reflux disease.   7. History of colonic polyps.      DISCHARGE DIAGNOSES:   1. Stable angina, negative Persantine Myoview.   2. Coronary artery disease.   3. History of percutaneous transluminal coronary angioplasty and       stenting to left circumflex and right coronary artery.   4. Hypertension.   5. Non-insulin-dependent diabetes mellitus.   6. Hypercholesteremia.   7. Gastroesophageal reflux disease.   8. History of colonic polyps.   9. Chronic anemia.      DISCHARGE HOME MEDICATIONS:   1. Enteric-coated aspirin 81 mg one tablet daily.   2. Plavix 75 mg one tablet daily with food.   3. Pepcid 20 mg one tablet twice daily.   4. Toprol-XL 50 mg one tablet daily.   5. Norvasc 5 mg one tablet daily.   6. Avapro 150 mg one tablet daily.   7. Lipitor 20 mg one tablet daily.   8. Nitrostat 0.4 mg sublingual use as directed.   9. Metformin 500 mg one tablet twice daily with meals.      DIET:  Low salt, low cholesterol, 1800 calories ADA diet.  The patient   has been advised to monitor blood sugar and blood pressure daily.      ACTIVITY:  As tolerated.      CONDITION AT DISCHARGE:  Stable.      Follow up with me in 1 week.      BRIEF HISTORY AND HOSPITAL COURSE:  [name] is a 60 year old black   female with past medical history significant for coronary artery   disease, status post PCI to left circ and RCA in the past, hypertension,   type 2 diabetes mellitus, GERD, was admitted by Dr. Montez Morita on October 16, 2008 because of left precordial chest discomfort described as   burning sensation off and on for last 2 days.  Chest discomfort was   associated with dyspnea and nausea, no vomiting or diaphoresis.  The   patient states she got temporary relief with chest discomfort using   sublingual nitro.  Denies any palpitations, syncope, or dizziness.  She   was seen in the ED, had EKG done which showed no evidence of new acute   ischemic changes.  The patient was admitted for further workup.      PAST MEDICAL HISTORY:  As above.      SOCIAL HISTORY:  She used to smoke in the past.      FAMILY HISTORY:  Positive for coronary artery disease.      PHYSICAL EXAMINATION:  GENERAL:  She was alert and oriented x3.   VITAL SIGNS:  Admission blood pressure was 177/72, pulse was 70,   regular.  She was afebrile.   HEENT:  Conjunctivae was pink.  No JVD, no bruit.   LUNGS:  Clear to auscultation.   CARDIOVASCULAR:  S1, S1 was normal.  There was no S3 or S4 gallop.   ABDOMEN:  Soft.  Bowel sounds were present, nontender.   EXTREMITIES:  There was no clubbing, cyanosis or edema.      LABORATORY DATA:  Hemoglobin was 11.5, hematocrit 35.2, white count of   7.5.  Her D-dimer was less than 0.22, CK was 83, MB 1.4, troponin I was   0.01.  Next set CK was 58, MB 1.1, troponin 0.01.  Cholesterol was 81,   triglyceride 104, HDL was low 35, LDL was 25, hemoglobin A1c was 6.6.   Sodium was 141, potassium 3.5, glucose was 93, BUN 7, creatinine 1.07.   Her BNP was less than 30, magnesium was 1.9.  Persantine Myoview done   yesterday showed no evidence of reversible ischemia or infarct with   normal wall motion and myocardial wall thickening with contraction.  EF   of 65%.      BRIEF HOSPITAL  COURSE:  The patient was admitted to telemetry unit.  MI   was ruled out by serial enzymes and EKG.  The patient did not have any   further episodes of chest pain during the hospital stay.  The patient   underwent Persantine Myoview, which showed no evidence of reversible   ischemia or infarct with normal EF of 65%.  The patient will be   discharged home on above medications and will be followed up in my   office in 1 week.               Allegra Lai. Terrence Dupont, M.D.   Electronically Signed            MNH/MEDQ  D:  10/19/2008  T:  10/19/2008  Job:  [id number]

## 2010-03-07 NOTE — Procedures (Signed)
Summary: EGD Report/[facility]   Imported By: Laural Benes 04/12/2009 14:46:37  _____________________________________________________________________  External Attachment:    Type:   Image     Comment:   External Document

## 2010-03-07 NOTE — Discharge Summary (Signed)
Summary: Abdominal Pain    NAME:  [name], [name]                ACCOUNT NO.:  [id number]      MEDICAL RECORD NO.:  [id number]          PATIENT TYPE:  INP      LOCATION:  1410                         FACILITY:  [facility]      PHYSICIAN:  Romero Belling, MD       DATE OF BIRTH:  [birth year]      DATE OF ADMISSION:  04/26/2009   DATE OF DISCHARGE:                                  DISCHARGE SUMMARY      POSSIBLE DISCHARGE DATE:  April 29, 2009.      ATTENDING PHYSICIAN:  Romero Belling, MD.      PRIMARY CARE PHYSICIAN:  The patient has a new primary care physician   set up at [facility].      DISCHARGE DIAGNOSES:   1. Abdominal pain unclear etiology.   2. Coronary artery disease with some intermittent chest pain.   3. Type 2 diabetes mellitus.   4. Hyperlipidemia.   5. Chronic anemia.   6. Chronic metabolic acidosis, possible renal tubular acidosis versus       possible diarrhea.   7. Acute kidney injury, resolved.   8. Deep venous thrombosis prophylaxis.      DISCHARGE MEDICATIONS:   1. Citalopram 10 mg p.o. daily.   2. Zofran 4 mg every 6 hours as needed.   3. Oxycodone 5 mg IR every 6 hours as needed.   4. Amlodipine 5 mg p.o. daily.   5. Aspirin 81 mg p.o. daily.   6. Avapro 150 mg p.o. daily.   7. Plavix 75 mg p.o. daily.   8. Famotidine 20 mg p.o. b.i.d.   9. Flonase 1 spray nasally daily.   10.Metformin 500 mg 1 tablet by mouth twice daily with meals.   11.Metoprolol 50 mg p.o. daily.   12.Nitroglycerin 1 tablet under tongue every 5 minutes up to 3 doses       p.r.n. chest pain.   13.Simvastatin 40 mg p.o. daily.   14.Ventolin 1 puff inhaled every 4 hours as needed p.r.n. shortness of       breath.      DISPOSITION:  The patient is discharged home with home health OT.  The   patient's occupational therapy recommended that the patient go to SNF.   I discussed with the patient.  She became teary-eyed and she did not   want to go to a skilled nursing facility.  I explained  the dangers of   not going to a skilled nursing facility to the patient.  I also spoke   with the patient's sister concerning the issue also.  Both the patient   and the patient's sister agreed for the patient to go to home   health/home PT/OT.      PROCEDURES PERFORMED:  The patient had a CT of the abdomen and pelvis   which showed right hemicolectomy with no current inflammatory bowel   obstructive findings.  Slight left basilar atelectasis or scarring,   moderate atheromatous vascular calcification, stable right renal atrophy   and slight diffuse  fatty infiltration of the liver, interval clearing of   removal of catheter, vagina with stable small bilateral inguinal hernias   containing fat only.  Stable advanced L5-S1 facet degenerative joint   disease with slight diffuse L4-L5 disk bulge, otherwise negative.  The   patient had ultrasound of the abdomen which showed no cholelithiasis,   minimal fluid distention, right renal collecting system consistent with   pyelectasis or hydronephrosis, no left renal lesion identified.      CONSULTATIONS:  None.      DISPOSITION:  The patient is supposed to follow up with, she has a   [facility] appointment on April 8 at 2:40 p.m.  The patient is supposed   to follow up with Dr. Terrence Dupont in 1-2 weeks.  The patient should follow   up with Dr. Olevia Perches in 2-3 weeks.  Things that need to be checked are the   status of the patient's abdominal pain, the status of the patient's   cardiac history, intermittent chest pain and the management of the   patient's general medical conditions.      HISTORY OF PRESENT ILLNESS:  Chief complaint abdominal pain.  This is a   60 year old female with a past medical history of coronary disease with   a Myoview September 11 showing no signs of ischemia with an EF 55% and   recent colonoscopy March 21 who comes in for abdominal pain right after   the colonoscopy.  She related to the epigastric area and right upper    quadrant.  Movement makes it worse.  Staying still makes it better.  She   has been having nausea and vomiting for the past day with no blood.  No   episode of diarrhea.  She relates no fever or chills.  Her nausea   appears to have no other symptoms with it.      HOSPITAL COURSE:   1. Abdominal pain.  The patient initially was not able to tolerate a       p.o. diet.  Later she was advanced to a p.o. diet and she was able       to tolerate that without any difficulty on April 29, 2009.  Still       unclear what was the cause of the patient's abdominal pain.   2. Coronary disease.  The patient is on aspirin and Plavix.  The       patient is most likely not on a beta-blocker secondary to concerns       of sinus bradycardia.  I have not placed the patient on a beta-       blocker.  The patient is on aspirin, Plavix, angiotensin receptor       blocker as well as statin.  We are in the process of getting the       patient's cardiac enzymes x2.  The patient describes some       intermittent chest pain.  The patient's EKG was unremarkable for       any ST or T changes and if the cardiac enzymes are negative x2 the       patient will be discharged home today with home health and home       PT/OT.   3. Hyperlipidemia.  The patient was on statin.   4. Chronic anemia.  The patient has a chronic anemia most likely       anemia of chronic disease.  Continued to monitor.   5. Metabolic acidosis.  The patient has chronic metabolic acidosis       unknown etiology.  Differential could be GI loss as well as renal       tubular acidosis.   6. Acute kidney injury.  The patient came in with acute kidney injury       most likely secondary to decreased oral intake.  The patient's       creatinine was about 1.36 and it is 0.88 on discharge.   7. Deep venous thrombosis prophylaxis.  The patient was placed on SCDs       for deep venous thrombosis prophylaxis.  Again, the patient will be       discharged to home  health and home PT/OT.               Romero Belling, MD            NH/MEDQ  D:  04/29/2009  T:  04/29/2009  Job:  [id number]      Electronically Signed by Earle Gell HAMID  on 05/07/2009 01:36:35 PM

## 2010-04-22 LAB — CBC
MCH: 27.6 pg (ref 26.0–34.0)
MCH: 27.9 pg (ref 26.0–34.0)
MCHC: 32.4 g/dL (ref 30.0–36.0)
MCV: 86.2 fL (ref 78.0–100.0)
MCV: 87.1 fL (ref 78.0–100.0)
Platelets: 324 10*3/uL (ref 150–400)
Platelets: 341 10*3/uL (ref 150–400)
RBC: 3.74 MIL/uL — ABNORMAL LOW (ref 3.87–5.11)
RDW: 16.8 % — ABNORMAL HIGH (ref 11.5–15.5)
RDW: 17.4 % — ABNORMAL HIGH (ref 11.5–15.5)
WBC: 7 10*3/uL (ref 4.0–10.5)

## 2010-04-22 LAB — POCT I-STAT, CHEM 8
BUN: 11 mg/dL (ref 6–23)
Chloride: 109 mEq/L (ref 96–112)
Glucose, Bld: 89 mg/dL (ref 70–99)
HCT: 36 % (ref 36.0–46.0)
Potassium: 4.1 mEq/L (ref 3.5–5.1)

## 2010-04-22 LAB — COMPREHENSIVE METABOLIC PANEL
ALT: 21 U/L (ref 0–35)
AST: 20 U/L (ref 0–37)
Albumin: 3.5 g/dL (ref 3.5–5.2)
Alkaline Phosphatase: 91 U/L (ref 39–117)
BUN: 10 mg/dL (ref 6–23)
CO2: 26 mEq/L (ref 19–32)
Chloride: 106 mEq/L (ref 96–112)
Creatinine, Ser: 1.06 mg/dL (ref 0.4–1.2)
Total Protein: 8 g/dL (ref 6.0–8.3)

## 2010-04-22 LAB — BASIC METABOLIC PANEL
BUN: 11 mg/dL (ref 6–23)
BUN: 9 mg/dL (ref 6–23)
CO2: 25 mEq/L (ref 19–32)
CO2: 30 mEq/L (ref 19–32)
Calcium: 8.7 mg/dL (ref 8.4–10.5)
Chloride: 101 mEq/L (ref 96–112)
Chloride: 104 mEq/L (ref 96–112)
Creatinine, Ser: 1.03 mg/dL (ref 0.4–1.2)
Creatinine, Ser: 1.15 mg/dL (ref 0.4–1.2)
GFR calc Af Amer: 58 mL/min — ABNORMAL LOW (ref >60)
GFR calc Af Amer: >60 mL/min (ref >60)
Glucose, Bld: 103 mg/dL — ABNORMAL HIGH (ref 70–99)

## 2010-04-22 LAB — HEPARIN LEVEL (UNFRACTIONATED)
Heparin Unfractionated: 0.31 IU/mL (ref 0.30–0.70)
Heparin Unfractionated: 0.32 IU/mL (ref 0.30–0.70)

## 2010-04-22 LAB — GLUCOSE, CAPILLARY
Glucose-Capillary: 101 mg/dL — ABNORMAL HIGH (ref 70–99)
Glucose-Capillary: 104 mg/dL — ABNORMAL HIGH (ref 70–99)
Glucose-Capillary: 114 mg/dL — ABNORMAL HIGH (ref 70–99)
Glucose-Capillary: 139 mg/dL — ABNORMAL HIGH (ref 70–99)
Glucose-Capillary: 153 mg/dL — ABNORMAL HIGH (ref 70–99)
Glucose-Capillary: 90 mg/dL (ref 70–99)

## 2010-04-22 LAB — DIFFERENTIAL
Eosinophils Absolute: 0.1 10*3/uL (ref 0.0–0.7)
Lymphs Abs: 2.3 10*3/uL (ref 0.7–4.0)
Monocytes Absolute: 0.8 10*3/uL (ref 0.1–1.0)
Monocytes Relative: 11 % (ref 3–12)
Neutrophils Relative %: 53 % (ref 43–77)

## 2010-04-22 LAB — CARDIAC PANEL(CRET KIN+CKTOT+MB+TROPI)
Relative Index: INVALID (ref 0.0–2.5)
Relative Index: INVALID (ref 0.0–2.5)
Total CK: 60 U/L (ref 7–177)
Troponin I: <0.01 ng/mL (ref 0.00–0.06)
Troponin I: <0.01 ng/mL (ref 0.00–0.06)

## 2010-04-22 LAB — CK TOTAL AND CKMB (NOT AT ARMC)
CK, MB: 0.7 ng/mL (ref 0.3–4.0)
Relative Index: INVALID (ref 0.0–2.5)
Relative Index: INVALID (ref 0.0–2.5)

## 2010-04-22 LAB — RETICULOCYTES
RBC.: 3.52 MIL/uL — ABNORMAL LOW (ref 3.87–5.11)
Retic Count, Absolute: 66.9 10*3/uL (ref 19.0–186.0)

## 2010-04-22 LAB — LIPID PANEL: VLDL: 47 mg/dL — ABNORMAL HIGH (ref 0–40)

## 2010-04-22 LAB — TROPONIN I: Troponin I: 0.03 ng/mL (ref 0.00–0.06)

## 2010-04-22 LAB — POCT CARDIAC MARKERS
CKMB, poc: <1 ng/mL — ABNORMAL LOW (ref 1.0–8.0)
Troponin i, poc: <0.05 ng/mL (ref 0.00–0.09)

## 2010-04-22 LAB — PROTIME-INR: INR: 0.87 (ref 0.00–1.49)

## 2010-05-01 LAB — DIFFERENTIAL
Basophils Absolute: 0 10*3/uL (ref 0.0–0.1)
Basophils Relative: 0 % (ref 0–1)
Basophils Relative: 0 % (ref 0–1)
Basophils Relative: 1 % (ref 0–1)
Eosinophils Absolute: 0 10*3/uL (ref 0.0–0.7)
Eosinophils Relative: 0 % (ref 0–5)
Eosinophils Relative: 1 % (ref 0–5)
Lymphocytes Relative: 29 % (ref 12–46)
Lymphocytes Relative: 40 % (ref 12–46)
Lymphs Abs: 1.3 10*3/uL (ref 0.7–4.0)
Monocytes Absolute: 0.5 10*3/uL (ref 0.1–1.0)
Monocytes Absolute: 0.7 10*3/uL (ref 0.1–1.0)
Monocytes Relative: 12 % (ref 3–12)
Monocytes Relative: 15 % — ABNORMAL HIGH (ref 3–12)
Neutro Abs: 2.1 10*3/uL (ref 1.7–7.7)
Neutro Abs: 2.5 10*3/uL (ref 1.7–7.7)
Neutrophils Relative %: 55 % (ref 43–77)

## 2010-05-01 LAB — COMPREHENSIVE METABOLIC PANEL
ALT: 36 U/L — ABNORMAL HIGH (ref 0–35)
AST: 41 U/L — ABNORMAL HIGH (ref 0–37)
Albumin: 2.7 g/dL — ABNORMAL LOW (ref 3.5–5.2)
Albumin: 3.1 g/dL — ABNORMAL LOW (ref 3.5–5.2)
Alkaline Phosphatase: 68 U/L (ref 39–117)
BUN: 11 mg/dL (ref 6–23)
BUN: 21 mg/dL (ref 6–23)
CO2: 18 mEq/L — ABNORMAL LOW (ref 19–32)
Calcium: 7.8 mg/dL — ABNORMAL LOW (ref 8.4–10.5)
Calcium: 7.8 mg/dL — ABNORMAL LOW (ref 8.4–10.5)
Chloride: 108 mEq/L (ref 96–112)
Creatinine, Ser: 1.12 mg/dL (ref 0.4–1.2)
Creatinine, Ser: 1.36 mg/dL — ABNORMAL HIGH (ref 0.4–1.2)
GFR calc Af Amer: 48 mL/min — ABNORMAL LOW (ref >60)
GFR calc non Af Amer: 40 mL/min — ABNORMAL LOW (ref >60)
Glucose, Bld: 90 mg/dL (ref 70–99)
Potassium: 4.1 mEq/L (ref 3.5–5.1)
Potassium: 4.4 mEq/L (ref 3.5–5.1)
Sodium: 135 mEq/L (ref 135–145)
Total Bilirubin: 0.8 mg/dL (ref 0.3–1.2)
Total Protein: 6.4 g/dL (ref 6.0–8.3)
Total Protein: 7.3 g/dL (ref 6.0–8.3)

## 2010-05-01 LAB — CBC
HCT: 27.2 % — ABNORMAL LOW (ref 36.0–46.0)
HCT: 27.8 % — ABNORMAL LOW (ref 36.0–46.0)
Hemoglobin: 10.2 g/dL — ABNORMAL LOW (ref 12.0–15.0)
Hemoglobin: 12.1 g/dL (ref 12.0–15.0)
Hemoglobin: 8.6 g/dL — ABNORMAL LOW (ref 12.0–15.0)
Hemoglobin: 9 g/dL — ABNORMAL LOW (ref 12.0–15.0)
MCHC: 31.3 g/dL (ref 30.0–36.0)
MCHC: 31.6 g/dL (ref 30.0–36.0)
MCHC: 31.6 g/dL (ref 30.0–36.0)
MCHC: 31.7 g/dL (ref 30.0–36.0)
MCHC: 31.7 g/dL (ref 30.0–36.0)
MCV: 87.2 fL (ref 78.0–100.0)
MCV: 87.8 fL (ref 78.0–100.0)
MCV: 88.3 fL (ref 78.0–100.0)
Platelets: 283 10*3/uL (ref 150–400)
RBC: 3.1 MIL/uL — ABNORMAL LOW (ref 3.87–5.11)
RBC: 3.72 MIL/uL — ABNORMAL LOW (ref 3.87–5.11)
RBC: 4.5 MIL/uL (ref 3.87–5.11)
RDW: 18.5 % — ABNORMAL HIGH (ref 11.5–15.5)
RDW: 18.6 % — ABNORMAL HIGH (ref 11.5–15.5)
RDW: 18.8 % — ABNORMAL HIGH (ref 11.5–15.5)
RDW: 19.2 % — ABNORMAL HIGH (ref 11.5–15.5)
WBC: 9 10*3/uL (ref 4.0–10.5)

## 2010-05-01 LAB — PHOSPHORUS: Phosphorus: 2.2 mg/dL — ABNORMAL LOW (ref 2.3–4.6)

## 2010-05-01 LAB — URINE MICROSCOPIC-ADD ON

## 2010-05-01 LAB — BASIC METABOLIC PANEL
CO2: 20 mEq/L (ref 19–32)
Calcium: 8.4 mg/dL (ref 8.4–10.5)
Chloride: 114 mEq/L — ABNORMAL HIGH (ref 96–112)
GFR calc Af Amer: >60 mL/min (ref >60)
Glucose, Bld: 110 mg/dL — ABNORMAL HIGH (ref 70–99)
Potassium: 3.7 mEq/L (ref 3.5–5.1)
Sodium: 138 mEq/L (ref 135–145)

## 2010-05-01 LAB — GLUCOSE, CAPILLARY
Glucose-Capillary: 106 mg/dL — ABNORMAL HIGH (ref 70–99)
Glucose-Capillary: 121 mg/dL — ABNORMAL HIGH (ref 70–99)
Glucose-Capillary: 124 mg/dL — ABNORMAL HIGH (ref 70–99)
Glucose-Capillary: 126 mg/dL — ABNORMAL HIGH (ref 70–99)
Glucose-Capillary: 157 mg/dL — ABNORMAL HIGH (ref 70–99)
Glucose-Capillary: 77 mg/dL (ref 70–99)
Glucose-Capillary: 83 mg/dL (ref 70–99)
Glucose-Capillary: 84 mg/dL (ref 70–99)
Glucose-Capillary: 89 mg/dL (ref 70–99)
Glucose-Capillary: 96 mg/dL (ref 70–99)

## 2010-05-01 LAB — URINALYSIS, ROUTINE W REFLEX MICROSCOPIC
Leukocytes, UA: NEGATIVE
Nitrite: NEGATIVE
Specific Gravity, Urine: 1.025 (ref 1.005–1.030)
pH: 5.5 (ref 5.0–8.0)

## 2010-05-01 LAB — CARDIAC PANEL(CRET KIN+CKTOT+MB+TROPI)
CK, MB: 0.8 ng/mL (ref 0.3–4.0)
Relative Index: INVALID (ref 0.0–2.5)
Total CK: 41 U/L (ref 7–177)
Total CK: 75 U/L (ref 7–177)
Troponin I: 0.01 ng/mL (ref 0.00–0.06)

## 2010-05-01 LAB — CK TOTAL AND CKMB (NOT AT ARMC): CK, MB: 1 ng/mL (ref 0.3–4.0)

## 2010-05-01 LAB — APTT: aPTT: 27 seconds (ref 24–37)

## 2010-05-01 LAB — LIPASE, BLOOD: Lipase: 42 U/L (ref 11–59)

## 2010-05-01 LAB — POCT CARDIAC MARKERS
CKMB, poc: <1 ng/mL — ABNORMAL LOW (ref 1.0–8.0)
Troponin i, poc: <0.05 ng/mL (ref 0.00–0.09)

## 2010-05-01 LAB — URINE CULTURE

## 2010-05-01 LAB — PROTIME-INR: Prothrombin Time: 13.3 seconds (ref 11.6–15.2)

## 2010-05-12 LAB — GLUCOSE, CAPILLARY
Glucose-Capillary: 108 mg/dL — ABNORMAL HIGH (ref 70–99)
Glucose-Capillary: 109 mg/dL — ABNORMAL HIGH (ref 70–99)
Glucose-Capillary: 128 mg/dL — ABNORMAL HIGH (ref 70–99)
Glucose-Capillary: 152 mg/dL — ABNORMAL HIGH (ref 70–99)
Glucose-Capillary: 223 mg/dL — ABNORMAL HIGH (ref 70–99)

## 2010-05-12 LAB — CK TOTAL AND CKMB (NOT AT ARMC)
CK, MB: 1.4 ng/mL (ref 0.3–4.0)
Total CK: 83 U/L (ref 7–177)

## 2010-05-12 LAB — COMPREHENSIVE METABOLIC PANEL
ALT: 17 U/L (ref 0–35)
AST: 13 U/L (ref 0–37)
AST: 17 U/L (ref 0–37)
Albumin: 3.3 g/dL — ABNORMAL LOW (ref 3.5–5.2)
Albumin: 3.8 g/dL (ref 3.5–5.2)
Alkaline Phosphatase: 85 U/L (ref 39–117)
Alkaline Phosphatase: 95 U/L (ref 39–117)
BUN: 8 mg/dL (ref 6–23)
Chloride: 106 mEq/L (ref 96–112)
Chloride: 109 mEq/L (ref 96–112)
GFR calc Af Amer: >60 mL/min (ref >60)
GFR calc Af Amer: >60 mL/min (ref >60)
Potassium: 3.5 mEq/L (ref 3.5–5.1)
Potassium: 3.7 mEq/L (ref 3.5–5.1)
Sodium: 141 mEq/L (ref 135–145)
Total Bilirubin: 0.6 mg/dL (ref 0.3–1.2)
Total Protein: 7.3 g/dL (ref 6.0–8.3)
Total Protein: 8.1 g/dL (ref 6.0–8.3)

## 2010-05-12 LAB — LIPID PANEL
Total CHOL/HDL Ratio: 2.3 RATIO
VLDL: 21 mg/dL (ref 0–40)

## 2010-05-12 LAB — CBC
HCT: 35.2 % — ABNORMAL LOW (ref 36.0–46.0)
Hemoglobin: 11.5 g/dL — ABNORMAL LOW (ref 12.0–15.0)
RBC: 3.8 MIL/uL — ABNORMAL LOW (ref 3.87–5.11)

## 2010-05-12 LAB — PROTIME-INR
INR: 1 (ref 0.00–1.49)
Prothrombin Time: 12.6 seconds (ref 11.6–15.2)

## 2010-05-12 LAB — CARDIAC PANEL(CRET KIN+CKTOT+MB+TROPI)
CK, MB: 1.1 ng/mL (ref 0.3–4.0)
Relative Index: INVALID (ref 0.0–2.5)
Relative Index: INVALID (ref 0.0–2.5)
Total CK: 51 U/L (ref 7–177)
Total CK: 58 U/L (ref 7–177)
Troponin I: 0.01 ng/mL (ref 0.00–0.06)
Troponin I: 0.01 ng/mL (ref 0.00–0.06)

## 2010-05-12 LAB — DIFFERENTIAL
Basophils Relative: 1 % (ref 0–1)
Eosinophils Absolute: 0.1 10*3/uL (ref 0.0–0.7)
Eosinophils Relative: 1 % (ref 0–5)
Monocytes Absolute: 0.7 10*3/uL (ref 0.1–1.0)
Monocytes Relative: 9 % (ref 3–12)
Neutro Abs: 3.7 10*3/uL (ref 1.7–7.7)

## 2010-05-12 LAB — HEMOGLOBIN A1C
Hgb A1c MFr Bld: 6.6 % — ABNORMAL HIGH (ref 4.6–6.1)
Mean Plasma Glucose: 143 mg/dL

## 2010-05-12 LAB — TSH: TSH: 1.33 u[IU]/mL (ref 0.350–4.500)

## 2010-06-20 NOTE — Discharge Summary (Signed)
[name], [name]                ACCOUNT NO.:  [id number]   MEDICAL RECORD NO.:  [id number]          PATIENT TYPE:  INP   LOCATION:  3737                         FACILITY:  [facility]   PHYSICIAN:  Mohan N. Terrence Dupont, M.D. DATE OF BIRTH:  12-Mar-1950   DATE OF ADMISSION:  09/16/2006  DATE OF DISCHARGE:  09/19/2006                               DISCHARGE SUMMARY   ADMITTING DIAGNOSES:  1. New onset angina, rule out myocardial infarction (MI), rule out      restenosis.  2. Coronary artery disease status post small non-Q-wave myocardial      infarction in April 2008, status post percutaneous coronary      intervention (PCI) to left circumflex.  3. Hypertension.  4. Noninsulin-dependent diabetes mellitus.  5. Hypercholesteremia.  6. Gastroesophageal reflux disease (GERD).  7. History of tobacco abuse.  8. Positive family history of coronary artery disease.   FINAL DIAGNOSES:  1. Stable angina.  2. Coronary artery disease, history of non-Q-wave myocardial      infarction (MI) in April 2006 status post percutaneous coronary      intervention (PCI) to left circumflex.  3. Hypertension.  4. Noninsulin-dependent diabetes mellitus.  5. Hypercholesteremia.  6. Gastroesophageal reflux disease (GERD).  7. History of tobacco abuse.  8. Positive family history of coronary artery disease.   DISCHARGE HOME MEDICATIONS:  1. Enteric-coated aspirin 81 mg 1 tablet daily.  2. Plavix 75 mg 1 tablet daily with food.  3. Toprol 50 mg 1 tablet daily.  4. Lipitor 20 mg 1 tablet daily.  5. Prilosec 20 mg 1 tablet twice daily.  6. Metformin 500 mg 1 tablet twice daily with meals.  7. Nitrostat 0.4 mg sublingual use as directed.  8. Norvasc 5 mg 1 tablet daily.   DIET:  Low-salt, low-cholesterol 1800-calorie ADA diet.  The patient has  been advised to monitor blood sugar daily.   ACTIVITY:  As tolerated.  Follow up with me in 1 week.   CONDITION AT DISCHARGE:  Stable.   BRIEF HISTORY AND HOSPITAL  COURSE:  [name] is a 60 year old black  female with past medical history significant for coronary artery disease  status post recent non-Q-wave myocardial infarction, status post PCI to  left circumflex in April 2008, hypertension, noninsulin-dependent  diabetes mellitus, hypercholesteremia, GERD, history of tobacco abuse,  positive family history of coronary artery disease who came to the ER  from cardiac rehab complaining of retrosternal chest pressure grade  8/10, associated with mild shortness of breath, nausea off and on since  last week.  Denies any palpitation, lightheadedness or syncope.  Denies  any orthopnea or leg swelling.  She states she took two sublingual nitro  with partial relief and was started on IV heparin and nitrates with  relief of chest pain.  EKG done in the ER showed normal sinus rhythm,  poor R-wave progression in V1-V3, and nonspecific T-wave changes.  No  new acute ischemic changes were noted.   PAST MEDICAL HISTORY:  As above.   PAST SURGICAL HISTORY:  She had tubal ligation in the past.   ALLERGIES:  No known drug allergies.   MEDICATION AT HOME:  1. Enteric-coated aspirin 81 mg p.o. daily.  2. Plavix 75 mg p.o. daily.  3. Toprol-XL 50 mg p.o. daily.  4. Lipitor 20 mg p.o. daily.  5. Prilosec 20 mg p.o. b.i.d.  6. Nitrostat 0.4 mg sublingual as directed.   SOCIAL HISTORY:  She is divorced, has 4 children.  Smoked half pack per  day for 30+ years.  Drinks beer occasionally socially.  She worked as a  Secretary/administrator.  Born in [place], [place].   FAMILY HISTORY:  Father died of cancer.  Mother died of MI.  Mother had  MI x 3.  She was hypertensive and diabetic.  She has 6 sisters and 2  brothers.  One sister had coronary artery disease and MI.   PHYSICAL EXAMINATION:  GENERAL:  She is alert and oriented x 3, in no  acute distress.  VITAL SIGNS:  Blood pressure was 120/100, pulse was 64 and regular.  HEAD AND NECK:  Conjunctivae was pink.  Neck  supple, no JVD, no bruit.  LUNGS:  Clear to auscultation without rhonchi or rales.  CARDIOVASCULAR: S1 and S2 normal.  There was a soft systolic murmur.  ABDOMEN:  Soft.  Bowel sounds were present, nontender.  EXTREMITIES:  There is no clubbing, cyanosis or edema.   LABORATORY DATA:  Cholesterol was 101, HDL was low at 34, LDL 45.  Three  sets of cardiac enzymes were negative.  Three sets of troponin-I were  also negative.  Sodium was 141, potassium 3.4, chloride 105, bicarb 28.  Glucose was 114, BUN 6, creatinine 0.82.  Her hemoglobin A1c was 8.2,  hemoglobin was 12.1, hematocrit 36.8, white count of 745.   BRIEF HOSPITAL COURSE:  The patient was admitted to telemetry unit.  MI  was ruled out by serial enzymes and EKG.  The patient subsequently  underwent Persantine Myoview which showed no evidence of reversible  ischemia with normal EF.  The patient has been ambulating in hallway  without any problems.  The patient will be discharged home on above  medications and will be followed up in my office in 1 week.      [name]. Terrence Dupont, M.D.  Electronically Signed     MNH/MEDQ  D:  09/19/2006  T:  09/20/2006  Job:  [id number]

## 2010-06-20 NOTE — Discharge Summary (Signed)
NAMECLEOTA, DONAWAY                ACCOUNT NO.:  [id number]   MEDICAL RECORD NO.:  [id number]          PATIENT TYPE:  INP   LOCATION:  6525                         FACILITY:  [facility]   PHYSICIAN:  Mohan N. Terrence Dupont, M.D. DATE OF BIRTH:  12-01-1950   DATE OF ADMISSION:  06/01/2007  DATE OF DISCHARGE:  06/04/2007                               DISCHARGE SUMMARY   ADMITTING DIAGNOSES:  1. Unstable angina, rule out myocardial infarction.  2. Coronary artery disease.  3. Hypertension.  4. Diabetes mellitus.  5. Hypercholesterolemia.  6. Anemia.   FINAL DIAGNOSES:  1. Status post unstable angina.  2. Status post left catheterization on percutaneous transluminal      coronary angioplasty stenting with right coronary artery.  3. Hypertension.  4. Diabetes mellitus.  5. Hypercholesterolemia.  6. Anemia.  7. History of partial colectomy in the recent past.   DISCHARGE HOME MEDICATIONS:  1. Enteric-coated aspirin 81 mg 1 tablet daily.  2. Plavix 75 mg 1 tablet daily with food.  3. Lopressor 25 mg 1 tablet twice daily.  4. Norvasc 5 mg 1 tablet daily.  5. Avapro 150 mg 1 tablet daily.  6. Lipitor 20 mg 1 tablet daily.  7. Metformin 500 mg 1 tablet twice daily with meals starting on      06/06/2007.  8. Omeprazole 20 mg 1 tablet twice daily.  9. Nitrostat 0.4 mg sublingual to use as directed.  10.Feosol 325 mg 1 tablet twice daily.   The patient has been advised to monitor blood sugars daily and status  post PTCA instructions have been given.   DIET:  Low-salt, low-cholesterol, 1800 calories ADA diet.   ACTIVITY:  Increase activity slowly.  The patient will be scheduled for  phase 2 cardiac rehab as outpatient.   CONDITION ON DISCHARGE:  Stable.   FOLLOWUP:  Follow up with me in 1 week and at the home GI in 2 weeks.   BRIEF HISTORY AND HOSPITAL COURSE:  Ms. Whoolery is a 60 year old black  female with past medical history significant for coronary artery disease  status post PTCA,  stent into left circumflex in April of 2008,  hypertension, diabetes mellitus, and anemia.  She came to the emergency  room complaining of substernal chest tightness diagnosed without any  associated symptoms, the patient says she feels like similar pain when  she had PCI for left circumflex about 21 years ago.   PAST MEDICAL HISTORY:  As above.   SOCIAL HISTORY:  She is ex-drinker and ex-smoker.   ALLERGIES:  No known drug allergies.   MEDICATIONS AT HOME:  Plavix, Lopressor, Norvasc, Avapro, Lipitor,  metformin, and omeprazole.   PHYSICAL EXAMINATION:  VITAL SIGNS:  Blood pressure was 176/73 and pulse  was 54.  HEENT:  Conjunctivae was pink.  NECK:  Supple.  No JVD.  No bruits.  LUNGS:  Clear to auscultation without rhonchi or rales.  CARDIOVASCULAR:  S1 and S2 was normal.  There was soft systolic murmur.  There was no S3 gallop or rub.  ABDOMEN:  Soft, nondistended.  EXTREMITIES: There is no clubbing, cyanosis, or  edema.   LABORATORY DATA:  Chest x-ray showed borderline cardiomegaly without  acute cardiopulmonary disease.  EKG showed normal sinus rhythm with  nonspecific ST-T wave changes.  Hemoglobin was 9.4, hematocrit 28.4,  which has been stable.  Her stools  for occult blood were positive.  Two  sets of cardiac enzymes were negative.  Rest of the labs unavailable on  discharge.   BRIEF HOSPITAL COURSE:  The patient was admitted to telemetry unit.  At  that time, she was ruled out by serial enzymes and EKG.  The patient has  not had any further episodes of chest pain during the hospital stay.  Discussed with the patient regarding noninvasive stress testing versus  left cath, also  PTCA stenting, its risks and benefits, i.e. death,  myocardial infarction, stroke, __________ CABG, risk of local vascular  complications, also regarding bare metal stenting versus drug-eluting  stenting and consented for this procedure.  The patient subsequently  underwent left cath and PTCA  stenting to mid RCA using bare metal stent  in view of heme-positive stool, and anemia as __________ tolerate long-  term antiplatelet therapy __________ platelet therapy without any  problems, which she tolerated.  __________ complications. Postprocedure,  the patient did not have any chest pain, __________ stable.  The patient  __________ to follow up with any problems.  The patient will be  discharged to home with above medications and will be followed up in my  office in 1 week and GI in 2 weeks.  We will recheck her hemoglobin.  We  will recheck her CBC in 1 weeks' time.      Allegra Lai. Terrence Dupont, M.D.  Electronically Signed     MNH/MEDQ  D:  06/04/2007  T:  06/05/2007  Job:  [id number]

## 2010-06-20 NOTE — Consult Note (Signed)
[name], [name]                ACCOUNT NO.:  [id number]   MEDICAL RECORD NO.:  [id number]          PATIENT TYPE:  INP   LOCATION:  6524                         FACILITY:  [facility]   PHYSICIAN:  Tory Emerald. Benson Norway, MD    DATE OF BIRTH:  07-26-1950   DATE OF CONSULTATION:  02/18/2007  DATE OF DISCHARGE:                                 CONSULTATION   REFERRING PHYSICIAN:  Mohan N. Terrence Dupont, M.D.   REASON FOR CONSULTATION:  Anemia and heme-positive stool.   This is a 60 year old female with a past medical history of coronary  artery disease, history of non-Q-wave MI status post PCI, hypertension,  diabetes, hyperlipidemia, and gastroesophageal reflux disease who is  admitted to the hospital with complaints of chest pain.  The pain was  reported to be left-sided and associated with some tightness with  radiation to the left arm.  The patient was admitted by Dr. Terrence Dupont, and  the patient was to undergo a cardiac catheterization.  However, she was  noted to have a decline in her hemoglobin.  Because of this decline, the  catheterization was aborted.  The patient does report having GERD  symptoms, and she gives a history of having melena.  No prior EGD or  colonoscopy in the past.  The patient denies taking any routine NSAIDs.  No reports of hematochezia.   Past medical history and past surgical history are as stated above.   Family history is noncontributory.   ALLERGIES:  No known drug allergies.   OUTPATIENT MEDICATIONS:  1. Aspirin 81 mg p.o. daily.  2. Plavix 75 mg p.o. daily.  3. Toprol 50 mg p.o. daily.  4. Lipitor 20 mg p.o. daily.  5. Prilosec 20 mg p.o. b.i.d.  6. Metformin 500 mg p.o. b.i.d.  7. Norvasc 5 mg p.o. daily.  8. Nitrostat 0.4 mg sublingual.   SOCIAL HISTORY:  The patient is divorced with four children.  Positive  tobacco.  Social alcohol use.  No illicit drug use.   Review of systems is as stated above in History of Present Illness.  Otherwise negative per  the 11-point review of systems.   PHYSICAL EXAMINATION:  VITAL SIGNS:  Stable.  GENERAL:  Patient is in no acute distress.  Alert and oriented.  HEENT:  Normocephalic.  Atraumatic.  Extraocular muscles intact.  NECK:  Supple.  No lymphadenopathy.  LUNGS:  Clear to auscultation bilaterally.  CARDIOVASCULAR:  Regular rate and rhythm.  ABDOMEN:  Obese.  Soft.  Nontender.  Nondistended.  EXTREMITIES:  No clubbing, cyanosis, or edema.  RECTAL EXAMINATION:  Rectal exam is negative for any palpable masses.  Heme positive.   LABORATORY VALUES:  Hemoglobin is at 9.5.   IMPRESSION:  Anemia, which appears to be acute in onset.  Her blood  count did drop down to 7.8 on today's value.  Further investigation with  an EGD will be performed.  Pending the examination, and a colonoscopy  may be recommended.      Tory Emerald Benson Norway, MD  Electronically Signed     PDH/MEDQ  D:  02/18/2007  T:  02/18/2007  Job:  [id number]   cc:   Allegra Lai. Terrence Dupont, M.D.

## 2010-06-20 NOTE — Cardiovascular Report (Signed)
NAMEAUBRIANNA, [name]                ACCOUNT NO.:  [id number]   MEDICAL RECORD NO.:  [id number]          PATIENT TYPE:  INP   LOCATION:  6525                         FACILITY:  [facility]   PHYSICIAN:  Mohan N. Terrence Dupont, M.D. DATE OF BIRTH:  1950-09-22   DATE OF PROCEDURE:  06/03/2007  DATE OF DISCHARGE:                            CARDIAC CATHETERIZATION   PROCEDURE:  1. Left cardiac catheterization with selective left and right coronary      angiography, left ventricular graphy via right groin using Judkins      technique.  2. Successful percutaneous transluminal coronary angioplasty to mid      right coronary artery using 2.5 x 8 mm long Voyager balloon.  3. Successful deployment of 3.0 x 15 mm long Multi-Link Vision stent      in mid right coronary artery.  4. Successful post-dilatation of Multi-Link Vision stent using 3.25 x      12 mm long [place] Voyager balloon.   INDICATIONS FOR PROCEDURE:  [name] is a 60 year old black female with  past medical history significant for coronary artery disease status post  PTCA stenting to mid circ in April 2008, hypertension, non-insulin-  dependent diabetes mellitus, GERD, history of tobacco abuse, positive  family history of coronary artery disease, hypercholesterolemia, chronic  anemia, status post partial colectomy.  In the recent past, was admitted  by Dr. Montez Morita on June 01, 2007, because of progressive, recurrent,  typical anginal chest pain.  MI was ruled out by serial enzymes and EKG.  Discussed with the patient regarding various options of treatment, i.e.  noninvasive stress testing versus left cath, possible PTCA stenting, its  risks and benefits, and consented for PCI.   PROCEDURE:  After obtaining the informed consent, the patient was  brought to the cath lab and was placed on fluoroscopy table.  Right  groin was prepped and draped in usual fashion.  Xylocaine 2% was used  for local anesthesia in the right groin.  With the help of  thin-wall  needle, 6-French arterial sheath was placed.  The sheath was aspirated  and flushed.  Next, 6-French left Judkins catheter was advanced over the  wire under fluoroscopic guidance up to the ascending aorta.  The wire  was pulled out.  The catheter was aspirated and connected to the  manifold.  Catheter was further advanced and engaged into left coronary  ostium.  Multiple views of the left system were taken.  Next, the  catheter was disengaged and was pulled out over the wire and was  replaced with a 6-French right Judkins catheter which was advanced over  the wire under fluoroscopic guidance up to the ascending aorta.  The  wire was pulled out.  The catheter was aspirated and connected to the  manifold.  Catheter was further advanced and engaged into right coronary  ostium.  Multiple views of the right system were taken.  Next, the  catheter was disengaged and was pulled out over the wire and was  replaced with 6-French pigtail catheter which was advanced over the wire  under fluoroscopic guidance up to the ascending  aorta.  The wire was  pulled out.  The catheter was aspirated and connected to the manifold.  Catheter was further advanced across the aortic valve into the LV.  LV  pressures were recorded.  Next, LV graft was done in 30 degrees RAO  position.  Post angiographic pressures were recorded from LV and then  pullback pressures were recorded from the aorta.  There was no gradient  across the aortic valve.  Next, a pigtail catheter was pulled out over  the wire.  Sheaths were aspirated and flushed.   FINDINGS:  LV showed good LV systolic function.  LVH EF of 50-55%.  Left  main was patent.  LAD was patent.  Diagonal-1 has 40-50% proximal  stenosis which was very small vessel.  Diagonal-2 was small which was  patent.  Ramus was small which was patent.  Left circumflex has 30-40%  proximal sequential stenosis and mid stented portion is patent and 40%  distal stenosis  beyond the stent.  OM-1 and OM-2 were very very small.  OM-3-OM-5 were small which were patent.  RCA has 20-30% proximal  stenosis and 80-85% mid stenosis with haziness and 30-40% distal  eccentric stenosis.  PDA is small which is patent.  PLV branches are  very small.   INTERVENTIONAL PROCEDURE:  Successful PTCA to mid RCA was done using 2.5  x 8 mm long Voyager balloon for predilatation and then 3.0 x 15 mm long  Multi-Link Vision stent was deployed in mid RCA at 13 atmospheric  pressures.  Stent was post-dilated using 3.25 x 12 mm long [place] Voyager  balloon going up to 20 atmospheric pressure.  Lesion was dilated from 80-  85% to less than 5% residual with excellent TIMI grade 3.  Distal flow  without evidence of dissection or distal embolization.  The patient  received weight-based Angiomax and 300 mg of Plavix during the  procedure.  The patient tolerated the procedure well.  There were no  complications.  The patient was transferred to recovery room in stable  condition.      Allegra Lai. Terrence Dupont, M.D.  Electronically Signed     MNH/MEDQ  D:  06/03/2007  T:  06/03/2007  Job:  [id number]

## 2010-06-20 NOTE — Discharge Summary (Signed)
[name], [name]                ACCOUNT NO.:  [id number]   MEDICAL RECORD NO.:  [id number]          PATIENT TYPE:  INP   LOCATION:  6524                         FACILITY:  [facility]   PHYSICIAN:  Mohan N. Terrence Dupont, M.D. DATE OF BIRTH:  1950-05-17   DATE OF ADMISSION:  05/06/2006  DATE OF DISCHARGE:  05/09/2006                               DISCHARGE SUMMARY   ADMITTING DIAGNOSIS:  1. Acute coronary syndrome.  2. Coronary artery disease.  3. Hypertension.  4. Non-insulin-dependent diabetes mellitus controlled by diet.  5. Gastroesophageal reflux disease,  6. History tobacco abuse.  7. Positive family history of coronary artery disease.   DISCHARGE DIAGNOSIS:  1. Status post acute non-Q-wave myocardial infarction, status post      percutaneous coronary intervention to left circumflex.  2. Hypertension.  3. Non-insulin-dependent diabetes mellitus.  4. Gastroesophageal reflux disease.  5. Status post hemoptysis/epistaxis.  6. History of tobacco abuse.  7. Positive family history of coronary artery disease.   DISCHARGE HOME MEDICATIONS:  1. Enteric-coated aspirin 325 mg tablet daily.  2. Plavix 75 mg tablet daily.  3. Toprol XL 50 mg 1 tablet daily.  4. Lipitor 20 mg 1 tablet daily.  5. Prilosec 20 mg 1 tablet twice daily.  6. Imdur 30 mg 1 tablet daily in the morning.  7. Nitrostat 0.4 mg sublingual use as directed.   DIET:  Low salt, low cholesterol, 1800 calories ADA diet.  Increase  activity slowly.  Avoid any lifting, pushing, pulling, sexual activity  for 1 week.  Post PCI instructions have been given.  Follow-up with me  in 1 week.   CONDITION AT DISCHARGE:  Stable.   BRIEF HISTORY AND HOSPITAL COURSE:  [name] is 60 year old black  female with past medical history significant for coronary artery  disease, hypertension, non-insulin-dependent diabetes mellitus  controlled by diet, GERD, history of tobacco abuse, positive family  history of coronary artery disease.   She came to the ER complaining of  recurrent retrosternal chest pain, grade 3 to 4/10 radiating to the left  arm associated with mild shortness of breath.  The patient was seen in  the ER approximately 1 week ago and was advised admission but refused as  she had to take care of grandchildren.  EKG done in the ER showed normal  sinus rhythm with poor R-wave progression in V1-V3 and nonspecific T-  wave changes and was noted to have mildly elevated troponin-I.  The  patient denies chest pain at present.  Denies any relation of chest pain  to food, breathing or movement.   PAST MEDICAL HISTORY:  As above.   PAST SURGICAL HISTORY:  Had tubal ligation in the past.   ALLERGIES:  NO KNOWN DRUG ALLERGIES.   MEDICATION AT HOME:  1. She is on Imdur 30 mg p.o. daily.  2. Toprol XL 25 mg p.o. daily.  3. Norvasc 5 mg p.o. b.i.d.  4. Enteric-coated aspirin 81 mg p.o. daily.  5. Protonix 20 mg p.o. b.i.d.  6. Nitrostat sublingual p.r.n.   SOCIAL HISTORY:  She is divorced, has four children on disability.  Smoked half pack per day for 30 years.  Drinks beer socially.  Born in  [place], [place].  Worked in Libr[employer], academic.   FAMILY HISTORY:  Father died of cancer.  He also had stroke.  Mother is  alive.  She had MI x3.  She is a diabetic and hypertensive.   PHYSICAL EXAMINATION:  GENERAL:  She was alert and oriented x3 in no  acute distress.  VITAL SIGNS:  Blood pressure was 140/88, pulse was 96.   HEENT:  Conjunctivae was pink.   NECK:  Supple, no JVD.   LUNGS:  Decreased breath sounds at bases.   CARDIOVASCULAR:  S1 and S2 normal.  There was soft systolic murmur.   ABDOMEN:  Soft.  Bowel sounds were present, nontender.   EXTREMITIES:  There is no clubbing, cyanosis or edema.   LABORATORY DATA:  Her point of care CPK-MB was 5.2, 6.6, 6.2, 4.9, 60.2  which were in normal range.  However, troponin I were slightly elevated  0.39, 0.47, 0.50, 0.53 and 0.58.  Her total CK was  128, MB 5.5, second  set CK 101, MB 5.4, third set CK 87, MB 2.7.  Troponin I by lab was  0.60, 1.01, 0.92 and 0.45 which was trending down.  Hemoglobin A1c was  6.4.  Sodium was 141, potassium 3.5, chloride 105, bicarb 30, glucose  88.  BUN 5, creatinine 0.94. Hemoglobin was 13.9, hematocrit 41.7, white  count of 5.5.  On 04/01 hemoglobin was 11.8, hematocrit 35.5, white  count was 6.143, hemoglobin was 11.5, hematocrit 34.3 which was stable.   BRIEF HOSPITAL COURSE:  The patient was admitted to step-down unit and  was started on IV heparin, Integrilin, nitrates, beta-blocker, aspirin  and Plavix.  The patient remained pain free during the hospital stay and  in the ER the patient subsequently underwent left cardiac cath with  selective left and right coronary angiography and PTCA and stenting to  left circumflex on 04/01 as per procedure report.  The patient tolerated  the procedure well.  There are no complications.  The patient did not  have any further episodes of chest pain during the hospital stay.  The  patient had one episode of epistaxis and hemoptysis.  Integrilin was  discontinued.  The patient did not have any  further episodes of epistaxis or hemoptysis.  Phase I cardiac rehab was  called.  The patient has been ambulating in the hallway without any  problems.  Her groin is stable with no evidence of hematoma or bruit and  the patient was discharged home in stable condition on May 09, 2006.      Allegra Lai. Terrence Dupont, M.D.  Electronically Signed     MNH/MEDQ  D:  09/26/2006  T:  09/27/2006  Job:  [id number]

## 2010-06-20 NOTE — Op Note (Signed)
NAMEPHI, CEREZO                ACCOUNT NO.:  [id number]   MEDICAL RECORD NO.:  [id number]           PATIENT TYPE:   LOCATION:                                 FACILITY:   PHYSICIAN:  Merri Ray. Grandville Silos, M.D.DATE OF BIRTH:  [birth year]   DATE OF PROCEDURE:  02/22/2007  DATE OF DISCHARGE:                               OPERATIVE REPORT   PREOPERATIVE DIAGNOSIS:  Right colon perforation.   POSTOPERATIVE DIAGNOSIS:  Right colon perforation.   PROCEDURE PERFORMED:  Right colectomy.   SURGEON:  Merri Ray. Grandville Silos, M.D.   ASSISTANT:  Adin Hector, MD   HISTORY OF PRESENT ILLNESS:  [name] is a 60 year old African-American  female with a history of significant coronary artery disease who was  admitted with chest pain on 02/17/2007.  She subsequently was noted to  be anemic with hemoccult-positive stools.  Workup included an upper  endoscopy and colonoscopy.  Multiple polyps were removed from her cecum  and right colon during colonoscopy on 02/20/2007.  She has had some  abdominal pain subsequently.  Initial plain x-rays did not show any free  air but she underwent CT scan of the abdomen and pelvis this morning  showing a perforation of her right colon.  She is brought emergently to  the operating room.  She received platelet transfusion as she has been  on aspirin and Plavix.   PROCEDURE IN DETAIL:  Informed consent was obtained.  The patient was  identified in the preoperative holding area.  Her site was marked.  She  received intravenous antibiotics.  She was brought to the operating  room.  General endotracheal anesthesia was administered by the  anesthesia staff.  Her abdomen was prepped and draped in a sterile  fashion.  A midline incision was made.  Subcutaneous tissues were  dissected down, revealing the fascia.  The fascia was divided along the  midline superior to the umbilicus due to her lower midline scar from a  tubal ligation.  Peritoneal cavity was entered under  direct vision  without difficulty.  The fascia was opened to the length of the  incision.  Exploration revealed approximately 1 cm perforation in her  right colon. There was some attempt of the appendices epiploica and  nearby bowel to stick to that area; however there was a small amount of  contamination.  Further exploration of the area revealed that the degree  of inflammation and size of the hold precluded primary repair.  Decision  was made to do right colectomy.   The right colon was mobilized from its lateral peritoneal attachments  along the line of Toldt using Bovie cautery and achieving excellent  hemostasis.  The right colon was mobilized up into the wound.  The  duodenum was protected and swept down.  The mobilization included the  hepatic flexure.  Once this was accomplished, the terminal ileum was  divided with a GI-75 stapler and proximal transverse colon was divided  with a GI-75 stapler and the mesentery of the right colon was divided  with the LigaSure, achieve excellent hemostasis.  We also used  a figure-  of-eight 2-0 silk LigaSure on the right colonic vessel.  Excellent  hemostasis was obtained.  The specimen was passed off and sent to  pathology. The patient remained hemodynamically stable and there was not  an overwhelming amount of contamination and her bowel had been prepped  previously.  The decision was made to go on and do an anastomosis.   Side-to-side anastomosis was done from the ileum to the proximal  transversa colon.  First a tacking stitch of 2-0 silk was placed.  Side-  to-side anastomosis was accomplished with a GI-75 stapler.  The staple  line was checked inside the bowel and there was no bleeding. The  resultant enterotomy was closed with TA-60, achieving excellent closure.  Some interrupted 3-0 silk were placed to get excellent hemostasis along  the staple line.  The anastomosis was widely patent.  A second cross  stitch was placed of 2-0 silk.   We changed our gloves.   The rent in the mesentery was closed with interrupted figure-of-eight 2-  0 silk sutures.  Anastomosis remained patent and bowel appeared pink and  viable.  The abdomen was copiously irrigated with several liters of warm  saline.  The bowel was returned to anatomic position.  The right upper  quadrant was re-inspected and hemostasis was assured.  The NG tube was  positioned correctly in the stomach.  The omentum was brought back down  over the bowel and the abdomen was closed.   The fascia was first closed from each end with #1 looped PDS and tied in  the middle.  Subcutaneous tissues were copiously irrigated and the skin  was closed with staples.  Sponge, needle and instrument counts were  correct.  Sterile dressings were applied.  The patient tolerated the  procedure well without apparent complication and was taken to the  recovery room in stable condition.      Merri Ray Grandville Silos, M.D.  Electronically Signed     BET/MEDQ  D:  02/22/2007  T:  02/22/2007  Job:  [id number]   cc:   Allegra Lai. Terrence Dupont, M.D.  Tory Emerald Benson Norway, MD

## 2010-06-20 NOTE — Discharge Summary (Signed)
[name], [name]                ACCOUNT NO.:  [id number]   MEDICAL RECORD NO.:  [id number]          PATIENT TYPE:  INP   LOCATION:  2022                         FACILITY:  [facility]   PHYSICIAN:  Mohan N. Terrence Dupont, M.D. DATE OF BIRTH:  03/29/1950   DATE OF ADMISSION:  02/17/2007  DATE OF DISCHARGE:  03/14/2007                               DISCHARGE SUMMARY   ADMITTING DIAGNOSES:  1. Chest pain rule, out myocardial infarction.  2. Coronary artery disease.  3. 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  4. Hypertension  5. Non-insulin-dependent diabetes mellitus  6. hypercholesteremia.  7. History of tobacco abuse.  8. History of alcohol abuse.  9. Anemia   FINAL DIAGNOSES:  1. Status post chest pain, myocardial infarction ruled out.  2. Stable angina.  3. Coronary artery disease.  4. Status post right partial colectomy for perforated colon secondary      to colonoscopy and polypectomy.  5. Hypertension.  6. Diabetes mellitus.  7. Chronic anemia.  8. History of tobacco abuse.  9. History of alcohol abuse.  10.Hypercholesteremia.   DISCHARGE HOME MEDICATIONS:  1. Enteric-coated aspirin 81 mg 1 tablet daily.  2. Plavix 75 mg 1 tablet daily with food.  3. Lopressor 25 mg 1 tablet twice daily.  4. Norvasc 5 mg 1 tablet daily.  5. Avapro 150 mg 1 tablet daily.  6. Lipitor 20 mg 1 tablet daily.  7. Metformin 500 mg 1 tablet twice daily.  8. Omeprazole 20 mg twice daily.  9. Nitrostat 0.4 mg sublingual, use as directed.   DIET:  Low salt, low cholesterol, 1800-calorie ADA diet.   ACTIVITY:  As tolerated.   FOLLOWUP:  1. With me in 1 week.  2. Follow up with Dr. Grandville Silos in 2 weeks.   CONDITION AT DISCHARGE:  Stable.   BRIEF HISTORY AND HOSPITAL COURSE:  [name] is a 60 year old black  female with past medical history significant for coronary artery  disease,  history of non-Q-wave MI in the past, status post PTCA and  stenting to left circumflex, hypertension, non-insulin-dependent  diabetes mellitus, hypercholesteremia, GERD, history of tobacco abuse,  positive family history of coronary artery disease, and anemia.  She  came to the ER complaining of retrosternal and left-sided chest pain  pressure, radiating to the left arm, associated with nausea and mild  shortness of breath.  Chest pain was a grade 6/10.  She received  sublingual nitro in the ER, with relief of chest pain.  Denies any  palpitation, lightheadedness, or syncope.  Denies relation of chest pain  to food, breathing, or movement.  Denies any cough, fever, or chills.  The patient recently stopped Lipitor due to myalgias.   PAST MEDICAL HISTORY:  As above.   PAST SURGICAL HISTORY:  She had tubal ligation in the past.   ALLERGIES:  NO KNOWN DRUG ALLERGIES.   MEDICATIONS AT HOME:  1. She was on enteric-coated aspirin 81 mg p.o. daily.  2. Plavix  75 mg p.o. daily.  3. Toprol-XL 50 mg p.o. daily.  4. Lipitor 20 mg p.o. daily, which was stopped.  5. Prilosec 20 p.o. b.i.d.  6. Metformin 500 p.o. b.i.d.  7. Norvasc 5 mg p.o. daily.  8. Nitrostat 0.4 mg sublingual p.r.n.   SOCIAL HISTORY:  She is divorced, has 4 children.  Smoked 1/2 pack per  day for 30+ years.  Drank beer socially.  Worked in the housekeeping  department.   FAMILY HISTORY:  Positive for coronary artery disease.   PHYSICAL EXAMINATION:  GENERAL:  She was alert, awake, oriented x3, in  no acute distress.  Blood pressure was 147/78, pulse was 84, regular.  HEENT:  Conjunctivae was pink.  NECK:  Supple, no JVD, no bruit.  LUNGS:  Clear to auscultation, without rhonchi or rales.  CARDIOVASCULAR:  S1, S2 was normal.  There was a soft systolic murmur.  There was no S3 gallop.  ABDOMEN:  Soft.  Bowel sounds were present, nontender.  EXTREMITIES:  There was no clubbing, cyanosis, or edema.   LABORATORIES:  Her  EKG showed normal sinus rhythm with nonspecific T-  wave changes.  Her hemoglobin was 9.5, hematocrit 29.5, white count of  5.7.  Potassium was 3.7, BUN 9, creatinine was 1.04, glucose was 136.  Two sets of cardiac enzymes,  CPK-MB, and troponin I were negative.   BRIEF HOSPITAL COURSE:  The patient was admitted to a telemetry unit.  MI was ruled out by serial enzymes and EKGs.  The patient did not have  any chest pains further.  Discussed with the patient regarding  noninvasive stress testing versus left catheterization and possible PTCA  stenting, its risks and benefits.  The patient consented for left  catheterization but patient on the day of procedure dropped her  hemoglobin from 9.5 to 8.88.  GI consultation was obtained prior to  considering catheterization.  The patient's H&H remained stable during  the hospital stay.  The patient subsequently underwent upper endoscopy,  which showed normal esophagus, stomach, and duodenum, with no evidence  of bleeding.  She subsequently underwent colonoscopy, which showed two 3  mm sessile polyps which were removed from right colon and cecal area.  The patient subsequently had abdominal pain associated with chills.  KUBs were obtained, and then CT of the abdomen was obtained which showed  right colonic perforation with small edematous changes.  Surgical  consultation was obtained with [facility].  The  patient subsequently underwent emergency right colectomy, without any  complications.  The patient initially was kept n.p.o. and was  subsequently started on clear liquids.  The patient had an episode of  ileus and early small bowel obstruction requiring NG suction and n.p.o.  for a few days, and was started on TPN.  The patient remained afebrile  during the hospital stay after the surgery.  The patient has been  ambulating in hallway without any problems. The patient's diet has been  gradually advanced and is tolerating a  low-cholesterol, low-salt diet  without any abdominal pain nor or nausea or vomiting.  The patient did  not have any further episodes of chest pain during the hospital stay.  The patient will be treated conservatively from a cardiac standpoint of  view at this point, and if she continues to have recurrent chest pain we  will schedule for catheterization and possible angioplasty as an  outpatient.      Allegra Lai. Terrence Dupont, M.D.  Electronically Signed  MNH/MEDQ  D:  03/14/2007  T:  03/17/2007  Job:  [id number]   cc:   Tory Emerald. Benson Norway, [place] Grandville Silos, M.D.

## 2010-06-20 NOTE — Consult Note (Signed)
[name], CSONKA                ACCOUNT NO.:  [id number]   MEDICAL RECORD NO.:  [id number]          PATIENT TYPE:  INP   LOCATION:  6529                         FACILITY:  [facility]   PHYSICIAN:  Merri Ray. Grandville Silos, M.D.DATE OF BIRTH:  November 09, 1950   DATE OF CONSULTATION:  DATE OF DISCHARGE:                                 CONSULTATION   REASON FOR CONSULTATION:  Perforation status post polypectomy.   HISTORY OF PRESENT ILLNESS:  Ms. Havlik is a very pleasant 60 year old  African American female with a history of coronary artery disease who  was admitted with chest pain on February 17, 2007.  The patient was set  to undergo cardiac catheterization by Dr. Terrence Dupont but she was noted to  have a drop in her hemoglobin to approximately 8.8.  She did report  history of melanoma and stools were heme positive.  GI consult was  obtained.  She was seen by Dr. Carol Ada.  Her catheterization was  cancelled.  Dr. Benson Norway did esophagogastroduodenoscopy on January 13 which  showed no acute significant findings.  The patient later underwent  colonoscopy on February 20, 2007.  Multiple polypectomies were done  including a piecemeal polypectomy of a 3-cm sessile polyp in the  ascending colon 3-4 cm from the cecum.  The patient developed  significant abdominal pain after the procedure.  However, abdominal  films at that time showed no free air.  The patient's white blood cell  count after the procedure was 13.1 and this has decreased to 10.3 but  she has continued to have significant abdominal pain since that time.  CT scan of the abdomen and pelvis was done this morning showing some  ascending colon stranding and localized free air consistent with a  perforation.  We are asked to see her for further evaluation of this  problem.   PAST MEDICAL HISTORY:  Includes:  1. Coronary artery disease.  2. Myocardial infarction.  3. Coronary stent placement.  4. Hypertension.  5. Diabetes mellitus.  6.  Hyperlipidemia.  7. GERD.   PAST SURGICAL HISTORY:  Tubal ligation.   SOCIAL HISTORY:  She lives alone.  She is divorced.  She drinks alcohol  socially.   ALLERGIES:  No known drug allergies.   FAMILY HISTORY:  Coronary artery disease.   MEDICATIONS:  Please refer to the [facility] but these include aspirin and  Plavix as she has had drug-eluting cardiac stents within the past 12  months.   REVIEW OF SYSTEMS:  GI SYSTEM:  Review is as above.  She also notes  recent melena prior to admission.  CARDIOVASCULAR SYSTEM:  She has no  current chest pain at this time.  PULMONARY:  Negative.  Remainder of  the review of systems unremarkable.   PHYSICAL EXAMINATION TODAY:  Temperature is 98.9.  Maximum temperature  over the past 24 hours was 100.1.  Blood pressure 124/53, pulse 82,  respirations 20.  GENERAL:  She is in no acute distress.  NEUROLOGIC:  She is awake and alert.  She follows commands and moves all  extremities.  NECK:  Supple with no  tenderness or masses.  LUNGS:  Clear to auscultation.  No significant wheezing is present.  CARDIOVASCULAR:  Heart is regular.  Impulse is palpable in the left  chest.  ABDOMEN:  Distended and tender on the right side greater than the left  side with guarding present on the right side.  A few bowel sounds are  present.  No masses are palpated.  She has a Flexi-Seal in place over  her anus.  SKIN:  Warm and dry.  No rashes are noted.  EXTREMITIES:  No significant edema.   DATA REVIEW:  Includes white blood cell count 10.3, hemoglobin 8.9.  BMET shows hypokalemia that Dr. Terrence Dupont is replacing.   IMPRESSION:  1. Right colon perforation status post polypectomy.  2. Multiple medical problems.  3. Antiplatelet therapy currently.   PLAN:  We will transfuse platelets.  We will type and cross for 2 units  of packed red blood cells.  We will take her to the operating room  emergently today for a right colectomy.  Procedure risks and benefits  were  discussed in detail with the patient.  Questions were answered and  she is agreeable.      Merri Ray Grandville Silos, M.D.  Electronically Signed     BET/MEDQ  D:  02/22/2007  T:  02/22/2007  Job:  [id number]   cc:   Tory Emerald. Benson Norway, MD  Allegra Lai. Terrence Dupont, M.D.

## 2010-06-20 NOTE — Cardiovascular Report (Signed)
[name], [name]                ACCOUNT NO.:  [id number]   MEDICAL RECORD NO.:  [id number]          PATIENT TYPE:  INP   LOCATION:  4707                         FACILITY:  [facility]   PHYSICIAN:  Mohan N. Terrence Dupont, M.D. DATE OF BIRTH:  [birth year]   DATE OF PROCEDURE:  08/28/2007  DATE OF DISCHARGE:                            CARDIAC CATHETERIZATION   PROCEDURE:  Left cardiac cath with selective left and right coronary  angiography, left ventriculography via right groin using Judkins  technique.   INDICATIONS FOR PROCEDURE:  [name] is a 60 year old black female with  past medical history significant for coronary artery disease status post  PTCA stenting to mid circ in 2008, and then PTCA stenting to RCA in  April 2009, hypertension, diabetes mellitus, hypercholesteremia, history  of colonic polyp status post partial colectomy, anemia, and GERD.  She  came to the ER complaining of retrosternal chest pain off and on since  yesterday described as tightness, pressure grade 6/10, associated with  nausea and mild shortness of breath, relieved with sublingual nitro.  States she was out of Plavix for the last 6 days.  Denies any prolonged  chest pain.  Denies chest pain at present.  Denies palpitation,  lightheadedness, or syncope.  Denies PND, orthopnea, or leg swelling.  The patient was admitted to telemetry unit.  Myocardial infarction was  ruled out by serial enzymes and EKG.  The patient subsequently underwent  Persantine Myoview on August 27, 2007, which showed mild small area of  apical inferolateral wall ischemia with EF of 57%.  Discussed with the  patient regarding mildly abnormal Persantine Myoview and various options  of treatment, i.e. medical versus invasive, its risks and benefits i.e.  death, MI, stroke, need for emergency CABG, risk of restenosis, local  vascular complications, etc., and consented for the procedure.   PROCEDURE:  After obtaining the informed consent, the  patient was  brought to the cath lab and was placed on fluoroscopy table.  Right  groin was prepped and draped in the usual fashion.  Xylocaine 2% was  used for local anesthesia in the right groin.  With the help of thin-  wall needle, a 6-French arterial sheath was placed.  This sheath was  aspirated and flushed.  Next, a 6-French left Judkins catheter was  advanced over the wire under fluoroscopic guidance up to the ascending  aorta.  Wire was pulled out.  The catheter was aspirated and connected  to the manifold.  Catheter was further advanced and engaged into left  coronary ostium.  Multiple views of the left system were taken.  Next,  the catheter was disengaged and was pulled out over the wire and was  replaced with 6-French right Judkins catheter, which was advanced over  the wire under fluoroscopic guidance up to the ascending aorta.  Wire  was pulled out.  The catheter was aspirated and connected to the  manifold.  Catheter was further advanced across the aortic valve into  the LV.  LV pressures were recorded.  Next, LV graft was done in 30-  degree RAO position.  Post  angiographic pressures were recorded from LV  and then pullback pressures were recorded from the aorta.  There was no  gradient across the aortic valve.  Next, a pigtail catheter was pulled  out over the wire.  Sheaths were aspirated and flushed.   FINDINGS:  LV showed good LV systolic function, LVH, and EF of 55-60%.  Left main was patent.  LAD has 10-15% mid stenosis.  Diagonal one and  two were very small.  Left circumflex has 40%-50% proximal and distal  stenosis.  Mid left circumflex stent is patent.  OM1 and OM2 are very  very small.  OM3 to OM5 were very small, which were patent.  RCA has 20%-  25% proximal and 30%-40% mid sequential and 25%-30% distal stenosis.  Stented segment in the midportion appears to be patent.  PDA and PLV  branches were very small, which were patent.  The patient tolerated the   procedure well.  There were no complications.  The patient was  transferred to recovery room in stable condition.      Allegra Lai. Terrence Dupont, M.D.  Electronically Signed     MNH/MEDQ  D:  08/28/2007  T:  08/29/2007  Job:  [id number]   cc:   Cath Lab

## 2010-06-21 ENCOUNTER — Other Ambulatory Visit (HOSPITAL_COMMUNITY): Payer: Self-pay | Admitting: Cardiology

## 2010-06-23 NOTE — Discharge Summary (Signed)
NAMELIVINGSTON, SENTERS                            ACCOUNT NO.:  [id number]   MEDICAL RECORD NO.:  [id number]                   PATIENT TYPE:  OIB   LOCATION:  5703                                 FACILITY:  [facility]   PHYSICIAN:  Mohan N. Terrence Dupont, M.D.              DATE OF BIRTH:  [birth year]   DATE OF ADMISSION:  07/23/2002  DATE OF DISCHARGE:  07/24/2002                                 DISCHARGE SUMMARY   ADMITTING DIAGNOSES:  Recurrent chest pain, rule out coronary insufficiency,  hypertension, history of bronchial asthma, history of tobacco abuse.   FINAL DIAGNOSES:  Mild coronary artery disease, gastroesophageal reflux  disease, hypertension, history of bronchial asthma, history of tobacco  abuse.   DISCHARGE MEDICATIONS:  Norvasc 10 mg one tablet daily, baby aspirin 81 mg  one tablet daily, Protonix 40 mg one tablet daily.   ACTIVITIES:  Avoid heavy lifting, pushing, or pulling for 48 hours.   DIET:  Low salt, low cholesterol.   DISCHARGE INSTRUCTIONS:  Post cardiac catheterization.  Those instructions  have been given.  Follow up with me in one week.   CONDITION ON DISCHARGE:  Stable.   BRIEF HISTORY AND HOSPITAL COURSE:  [name] is a 60 year old black  female with past medical history significant for hypertension, history of  bronchial asthma, tobacco abuse, with complaints of recurrent chest pain  radiating to the left arm, associated with numbness in the left arm lasting  3 minutes relieved with rest and sublingual nitro.  Patient denies any  nausea, vomiting, diaphoresis, __________ or lightheadedness.  No syncope.  She denies PND, orthopnea or leg swelling.   Patient has Persantine Cardiolite in November of 2003, which was negative  for ischemia.   EKG done in our office today showed new T-wave inversion in the lateral  leads as compared to prior EKG.   Due to recurrent chest pain and minor EKG changes, discussed with patient  regarding left cath, possible  PTCA and stenting, and risks and benefits and  consent for the procedure.   PAST MEDICAL HISTORY:  As above.   PAST SURGICAL HISTORY:  She had tubal ligation in the past.   ALLERGIES:  No known drug allergies.   MEDICATIONS:  She takes Norvasc 5 mg p.o. q.d., enteric-coated aspirin 325  mg p.o. q.d.   SOCIAL HISTORY:  She is divorced.  Has 4 children.  Smoked one pack per day  for 37 years, drinks beer socially.  She was a Secretary/administrator.  Born in  [place], [place], lives in [place].   FAMILY HISTORY:  Father died of cancer.  Mother is alive, had MI x3.  She is  hypertensive and diabetic.  She has 2 brothers and 6 sisters in good health.   PHYSICAL EXAMINATION:  GENERAL:  She is alert, awake, and oriented x3.  VITAL SIGNS:  Blood pressure 130/70, pulse was 82 and regular.  HEENT:  Conjunctivae were pink.  NECK:  Supple, no JVD, no bruit.  LUNGS:  Clear to auscultation without rhonchi or rales.  CARDIAC:  S1, S2 normal.  There was no S3 gallop, no murmur.  ABDOMEN:  Soft.  Bowel sounds are present.  Nontender.  EXTREMITIES:  There is no clubbing, cyanosis, or edema.   BRIEF HOSPITAL COURSE:  Patient was an a.m. admit and underwent left cardiac  cath with selective left and right coronary angiography and LV angiography  via right groin using Judkins technique.  Patient tolerated the procedure  very well, no complications.  Patient did not have any significant coronary  artery disease.  Patient was admitted overnight as there was no one to take  care of her and there was no ride for the patient to go home.  Patient did  not have any further episodes of chest pain during the hospital stay.  Her  groin was stable.  There was no evidence of hematoma or bruit.  This morning  her potassium was 3.1, which has been replaced.  Patient will be discharged  home on the above medications and then be followed up in my office in one  week.                                                Allegra Lai. Terrence Dupont, M.D.    MNH/MEDQ  D:  07/24/2002  T:  07/26/2002  Job:  [id number]

## 2010-06-23 NOTE — Cardiovascular Report (Signed)
[name], [name]                ACCOUNT NO.:  [id number]   MEDICAL RECORD NO.:  [id number]          PATIENT TYPE:  OIB   LOCATION:  6501                         FACILITY:  [facility]   PHYSICIAN:  [name], M.D. DATE OF BIRTH:  03/02/1950   DATE OF PROCEDURE:  01/11/2004  DATE OF DISCHARGE:                              CARDIAC CATHETERIZATION   PROCEDURE:  Left cardiac catheterization with selective left and right  coronary angiography and left ventriculography via the right groin using  Judkins technique.   INDICATIONS:  [name] is 60 year old black female with past medical  history significant for hypertension, noninsulin-dependent diabetes mellitus  controlled by diet, gastroesophageal reflux disease, history of tobacco  abuse, history of bronchial asthma.  Complains of retrosternal chest pain  radiating to the left shoulder and left arm lasting few minutes, relieved  with rest.  The patient gives history of exertional chest pain.  Denies any  nausea, vomiting, diaphoresis.  Denies shortness of breath.  Denies  palpitation, lightheadedness or syncope.  Denies PND, orthopnea, leg  swelling.  EKG done in the office showed normal sinus rhythm with T-wave  inversion in anterolateral leads which were slightly more pronounced from  prior EKG.   PAST MEDICAL HISTORY:  As above.   PAST SURGICAL HISTORY:  She had tubal ligation in the past.   ALLERGIES:  No known drug allergies.   MEDICATIONS AT HOME:  1.  Toprol XL 25 mg p.o. daily.  2.  Norvasc 10 mg p.o. daily.  3.  Protonix 40 mg p.o. daily.  4.  Baby aspirin 81 mg p.o. daily.   SOCIAL HISTORY:  She is divorced, has four children.  Smoked half pack per  day for 30 years.  Drinks beer socially.  Works as Secretary/administrator.  Born in  [place], [place].   FAMILY HISTORY:  Father died of cancer.  Mother had myocardial infarction  x3.  She is hypertensive, diabetic.  Two brothers and six sisters in good  health.   PHYSICAL  EXAMINATION:  GENERAL:  She was alert, awake, oriented x3 in no  acute distress.  VITAL SIGNS:  Blood pressure 130/70, pulse 60, regular.  HEENT:  Conjunctivae pink.  NECK:  Supple.  No jugular venous distention.  No bruit.  LUNGS:  Clear to auscultation without rhonchi or rales.  CARDIOVASCULAR:  S1, S2 was normal.  There was no S3 gallop.  ABDOMEN:  Soft.  Bowel sounds are present.  Nontender.  EXTREMITIES:  No clubbing, cyanosis or edema.   IMPRESSION:  1.  Recurrent chest pain with EKG changes, rule out coronary insufficiency.  2.  Hypertension.  3.  Noninsulin-dependent diabetes mellitus controlled by diet.  4.  Gastroesophageal reflux disease.   Discussed with patient regarding noninvasive stress testing versus invasive  left cath its risks and benefits; i.e., death, myocardial infarction,  stroke, local vascular complications, etc., and consented for the procedure.   PROCEDURE:  After obtaining informed consent, the patient was brought to the  cath lab and was placed on fluoroscopy table. Right groin was prepped and  draped in usual fashion.  2% Xylocaine was used for local anesthesia in the  right groin.  With the help of thin-wall needle, a 4-French arterial sheath  was placed.  The sheath was aspirated and flushed.  Next, 4-French left  Judkins catheter was advanced over the wire under fluoroscopic guidance up  to the ascending aorta.  Wire was pulled out and the catheter was aspirated  and connected to the manifold. Catheter was further advanced and engaged  into left coronary ostium.  Multiple views of the left system were taken.  Next, the catheter was disengaged and was pulled out over the wire and was  replaced with 4-French right Judkins catheter which was advanced over the  wire under fluoroscopic guidance to the ascending aorta.  Wire was pulled  out, the catheter was aspirated and connected to the manifold.  Catheter was  further advanced and engaged into right  coronary ostium.  Multiple views of  the right system were taken.  Next, the catheter was disengaged and was  pulled out over the wire and was replaced with 4-French pigtail catheter  which was advanced over wire under fluoroscopic guidance up to the ascending  aorta.  Wire was pulled out, the catheter was aspirated and connected to the  manifold.  Catheter was further advanced across the aortic valve into the  left ventricle.  Left ventricular pressures were recorded.  Next, left  ventriculography was done in 30-degree RAO position.  Post angiographic  pressures were recorded from LV and then pullback pressures were recorded  from the aorta.  There was no gradient across the aortic valve.  Next, the  pigtail catheter was pulled out over the wire, sheaths aspirated and  flushed.   FINDINGS:  The LV showed good left ventricular systolic function.  EF of 55-  60%.  There was moderate LVH.  Left main was patent.  LAD has 10-15% mid  stenosis.  Diagonal one and two are small which were patent.  Left  circumflex has 50-60% mid stenosis proximally.  Left circumflex was  tortuous.  OM-1 was patent.  OM-2 and 3 were less than 0.5 mm.  OM-4 and 5  were small which were patent.  RCA has 15-20% proximal stenosis with  haziness with TIMI-3 grade distal flow.  The patient tolerated the procedure  well.  There were no complications.  The patient was transferred to recovery  room in stable condition.       MNH/MEDQ  D:  01/11/2004  T:  01/11/2004  Job:  [id number]

## 2010-06-23 NOTE — Cardiovascular Report (Signed)
NAMEATTIE, LEVINE                ACCOUNT NO.:  [id number]   MEDICAL RECORD NO.:  [id number]          PATIENT TYPE:  INP   LOCATION:  6524                         FACILITY:  [facility]   PHYSICIAN:  Mohan N. Terrence Dupont, M.D. DATE OF BIRTH:  Jun 06, 1950   DATE OF PROCEDURE:  05/07/2006  DATE OF DISCHARGE:                            CARDIAC CATHETERIZATION   PROCEDURE:  1. Left cardiac catheterization with selective left and right coronary      angiography.  Left ventriculography via right groin using Judkins      technique.  2. Successful percutaneous transluminal coronary angioplasty to mid      left circumflex using 2.5 x8-mm long wire J balloon.  3. Successful deployment of 2.5 x13-mm long Cypher drug-eluting stent      in mid left circumflex.   INDICATIONS FOR THE PROCEDURE:  [name] is a 60 year old black female  with past medical history significant for coronary artery disease,  hypertension, non-insulin-dependent diabetes mellitus controlled by  diet, GERD, history of tobacco abuse, positive family history of  coronary artery disease.  She came to the ER complaining of recurrent  retrosternal chest pain, rated 3/10 to 4/10, radiating to the left arm  associated with mild shortness of breath.  The patient was seen in the  ER approximately 1 week ago.  She was advised admission but refused, as  she has to take care of her grandchildren.  EKG done in the ER showed  normal sinus rhythm with poor R-wave progression in V1 and V3 and  nonspecific T-wave changes and was noted to have mildly elevated  troponin I.  The patient denies chest pain at present.  Denies relation  of chest pain to full breathing or movement.  Due to typical anginal  chest pain, mildly elevated troponin I, and multiple risk factors, I  discussed with the patient regarding left cath, possible PTCA stenting,  as well as risks and benefits, i.e., MI, death, stroke, need for  emergency CABG, risk of restenosis, local  vascular complications, etc.,  and she consented for PCI.   PROCEDURE:  After obtaining the informed consent, the patient was  brought to the cath lab and was placed on the fluoroscopy table.  Right  groin was prepped and draped in the usual fashion.  Then, 2% Xylocaine  was used for local anesthesia in the right groin.  With the help of thin-  walled needles, a 6-French arterial sheath was placed.  The sheath was  aspirated and flushed.  Next, 6-French left Judkins catheter was  advanced through the wire under fluoroscopic guidance to the ascending  aorta.  The wire was pulled out.  The catheter was aspirated and  connected to the manifold.  The catheter was further advanced and  engaged into left coronary ostium.  Multiple views of the left distal  were taken.  Next, the catheter was disengaged and was pulled out over  the wire and was exchanged for 6 French right Judkins catheter, which  was advanced over the wire up to the ascending aorta.  Wire was pulled  down.  The catheter  was aspirated and linked into the manifold.  The  catheter was further advanced and engaged into the right coronary  ostium.  Multiple views of the right system were obtained.  Next, the  catheter was disengaged and was pulled out over the wire and was  replaced with 6-French pigtail catheter.  This was advanced over the  wire and under fluoroscopic guidance up to the ascending aorta.  The  wire was pulled out.  The catheter was aspirated and connected to the  manifold.  The catheter was further advanced across the aortic valve  into the LV.  LV pressures were recorded.  Next, left ventriculography  was done in the 30-degree RAO position.  Next, __________ pressures were  recorded from LV and then pullback pressures were recorded from the  aorta.  There was no gradient across the aortic valve.  Next, the  pigtail catheter was pulled out over the wire.  The sheath was aspirated  and flushed.   FINDINGS:  The  LV showed mild inferior wall hypokinesia, EF of 50% to  55%.  Left main was patent.  LAD was patent.  Diagonal 1 was small,  which has 25% to 30% proximal stenosis.  Diagonal 2 was small, which was  patent.  The ramus was small, which was patent.  Left circumflex has 30%  to 40% proximal stenosis as well as proximally is tortuous and 80% to  85% focal mid stenosis and then 30% to 40% distal diffuse stenosis.  OM1  and OM2 were less than 0.5 mm.  OM3, OM4, and OM5 were small, which were  patent.  RCA has 20% to 30% mid stenosis.   INTERVENTIONAL PROCEDURES:  Successful percutaneous transluminal  coronary angioplasty to mid left circumflex was done using 2.5 x 8-mm  long wire J balloon for pre-dilatation and then 2.75 x13-mm long Cypher  drug-eluting stent was deployed at 11 atmospheric pressure, which was  fully expanded going up to 13 atmospheres of pressure.  The lesion was  dilated from 85% to 0% residual with excellent TIMI grade 3 distal flow  without evidence of dissection or distal embolization.  The patient  received weight-based heparin, Integrilin, and 300 mg of Plavix prior to  the procedure.  The patient tolerated the procedure well.  There were no  complications.  The patient was transferred to the recovery room in  stable condition.           ______________________________  Allegra Lai Terrence Dupont, M.D.     MNH/MEDQ  D:  05/07/2006  T:  05/07/2006  Job:  [id number]   cc:   Cath Lab

## 2010-06-23 NOTE — Cardiovascular Report (Signed)
[name], [name]                            ACCOUNT NO.:  [id number]   MEDICAL RECORD NO.:  [id number]                   PATIENT TYPE:  OIB   LOCATION:  2899                                 FACILITY:  [facility]   PHYSICIAN:  Allegra Lai. Terrence Dupont, M.D.              DATE OF BIRTH:  Apr 01, 1950   DATE OF PROCEDURE:  DATE OF DISCHARGE:                              CARDIAC CATHETERIZATION   PROCEDURE:  1. Left cardiac catheterization.  2. Selective left and right coronary angiography.  3. Angiography of the right groin using Judkins technique.   INDICATIONS FOR PROCEDURE:  The patient is a 60 year old black female with a  past medical history significant for hypertension, history of bronchial  asthma, tobacco abuse.  She complains of recurrent retrosternal chest pain  radiating to the left arm associated with numbness in the last arm lasting a  few minutes, relieved with sublingual nitroglycerin.  The patient denies any  nausea, vomiting or diaphoresis.  Denies palpitation, lightheadedness or  syncope.  Denies any orthopnea or leg swelling.  The patient had a  Persantine Cardiolite in November of 2003 which was negative for ischemia.   EKG done in the office today showed new T-wave inversion in the lateral  leads--compared to prior EKG.  Due to recurrent chest pain and new minor EKG  changes, discussed with the patient regarding left catheterization and  possible PTCA and stenting, its risks and benefits, and consented for PCI.   PAST MEDICAL HISTORY:  As above.   PAST SURGICAL HISTORY:  She had tubal ligation in the past.   ALLERGIES:  No known drug allergies.   MEDICATIONS:  1. She is on Norvasc 5 mg p.o. daily.  2. Enteric-coated aspirin 325 mg p.o. daily.   SOCIAL HISTORY:  She is divorced.  She has four children.  She has smoked  one pack per day for 37 years.  She drinks beer socially.  She works as a  Secretary/administrator.  She was born in [place], [place], and she now lives in  [place].   FAMILY HISTORY:  Father died of cancer.  Mother is alive and had an MI x3,  and she is hypertensive and diabetic.  She has two brothers and sisters in  good health.   PHYSICAL EXAMINATION:  GENERAL:  She is alert, oriented, and in no acute  distress.  VITAL SIGNS:  Blood pressure 130/70, pulse 82 and regular.  Conjunctivae was  pink.  NECK:  Supple, no JVD, no bruits.  LUNGS:  Clear to auscultation without rhonchi or rales.  CARDIOVASCULAR:  S1 and S2 were normal.  There was no S3, gallop or murmur.  ABDOMEN:  Soft, bowel sounds are present, nontender.  EXTREMITIES:  There was no clubbing, cyanosis or edema.   STUDIES:  EKG done in my office showed normal sinus rhythm with lateral T-  wave inversions.  EKG this morning showed normal sinus rhythm  with T-wave  inversion in inferior leads and nonspecific T-wave changes in the septal  leads.   IMPRESSION:  1. Recurrent chest pain.  2. Minor EKG changes, rule out coronary insufficiency.  3. Hypertension.  4. History of bronchial asthma.  5. Tobacco abuse.   I discussed with the patient regarding left catheterization, possible PTCA  and stenting, its risks including death, stroke, need for emergency CABG,  risk of restenosis, peripheral vascular complications, etc.  She consented  for PCI.   PROCEDURE:  After obtaining informed consent, the patient was brought to the  catheterization lab and was placed on fluoroscopy table.  The right groin  was prepped and draped in the usual fashion.  Xylocaine 2% was used for  local anesthesia in the right groin.  A __________ thin-walled needle with 6-  French arterial sheath was placed.  The sheath was aspirated and flushed.  Next, a 6-French left Judkins catheter was advanced over the wire, under  fluoroscopy guidance, up to the ascending aorta.  The wire was pulled out.  The catheter was aspirated and connected to the manifold.  The catheter was  further advanced and engaged  into the left coronary ostium.  Multiple views  of the left system were taken.   Next, the catheter was disengaged and was pulled out over the wire, and was  replaced with a 6-French right Judkins catheter which was advanced over the  wire under fluoroscopic guidance up to the ascending aorta.  The wire was  pulled out.  The catheter was aspirated and connected to the manifold.  The  catheter was further advanced and engaged into the right coronary ostium.  Multiple views of the right system was taken.   Next, the catheter was disengaged and was pulled out over the wire, and it  was replaced with a 6-French pigtail catheter which was advanced over the  wire under fluoroscopic guidance up to the ascending aorta.  The wire was  pulled out.  The catheter was aspirated and connected to the manifold.  The  catheter was further advanced across the aortic valve into the LV.  The LV  pressures were recorded.   Next left ventricular angiography was done in 30 degree RAO position.  Post  angiographic pressures were recorded from LV, and then pullback pressures  were recorded from the aorta.  There was no gradient across the aortic  valve.  Next, the pigtail catheter was pulled out.  The sheaths were  aspirated and flushed.   FINDINGS:  Left ventricle showed moderate left ventricular hypertrophy.  There was 2+ __________ MR, EF of 60-65%.  The left main was patent.  The  LAD had 10-20% mid stenosis.  Diagonal 1 through diagonal 3 were very small,  but were patent.  The left circumflex had 10-15% proximal stenosis.  The  proximal LAD vessel is tortuous.  OM1 is very, very small.  It is patent.  OM2 to OM4 were patent.  The ramus was small which was patent.  The ostial  was nondominant, small vessel which was patent.   The arteriotomy was closed with Perclose without complications.   The patient tolerated the procedure well.  The patient was transferred to the recovery room in stable  condition.    NOTE:  The patient does not have any ride to go home, and there is no one at  home to take care of her.  Tonight, the patient will be admitted to a  regular, non-telemetry bed, and  will be discharged home tomorrow.                                               Allegra Lai. Terrence Dupont, M.D.    MNH/MEDQ  D:  07/23/2002  T:  07/25/2002  Job:  [id number]

## 2010-06-23 NOTE — Cardiovascular Report (Signed)
NAMETAKA, GAFFKE                ACCOUNT NO.:  [id number]   MEDICAL RECORD NO.:  [id number]          PATIENT TYPE:  INP   LOCATION:  3740                         FACILITY:  [facility]   PHYSICIAN:  Mohan N. Terrence Dupont, M.D. DATE OF BIRTH:  05-Jun-1950   DATE OF PROCEDURE:  02/27/2005  DATE OF DISCHARGE:                              CARDIAC CATHETERIZATION   PROCEDURE:  Left cardiac cath with selective left and right coronary  angiography, LV graft, aortography with visualization of bilateral renal  arteries via left groin using Judkins technique.   INDICATION FOR PROCEDURE:  Ms. Rothenberger is a 60 year old black female with past  medical history significant for moderate coronary artery disease,  hypertension, noninsulin-dependent diabetes mellitus controlled by diet,  GERD, history of tobacco abuse, history of bronchial asthma. She came to the  ER complaining of left-sided chest pain described as pressure grade 6/10  radiating to the left arm associated with mild shortness of breath while  getting ready to go to church. She states she has been having chest pain off  and on for last one month but today chest pain was severe. She received  sublingual nitroglycerin, aspirin and then was started on IV heparin and  nitrates with relief of chest pain in the ER. Denies any nausea, vomiting,  diaphoresis. Denies palpitation, lightheadedness or syncope. Denies PND,  orthopnea or leg swelling. EKG done in the ER showed normal sinus rhythm.  Repeat normal sinus rhythm with T-wave inversion in high lateral leads which  were new from prior EKG and nonspecific ST-T wave changes in the  anterolateral leads. Due to typical anginal chest pain, multiple risk  factors and new minor EKG changes, discussed with the patient regarding left  cath, possible PTCA stenting; its risks, i.e. death, stroke, need for  emergency CABG, risk of restenosis, local vascular complications, etc. and  consented for the procedure.   PROCEDURE:  After obtaining the informed consent, the patient was brought to  the cath lab and was placed on fluoroscopy table. Right groin was prepped  and draped in usual fashion. 2% Xylocaine was used for local anesthesia.  With the help of thin-wall needle, multiple attempts were made to insert the  sheath without success. Then with the help of thin-wall needle, a 6-French  arterial sheath was placed in the left femoral artery without problems.  Next, 6-French left Judkins catheter was advanced over the wire under  fluoroscopic guidance up to the ascending aorta. Wire was pulled out, the  catheter was aspirated and connected to the manifold. Catheter was further  advanced and engaged into left coronary ostium. Multiple views of the left  system were taken. Next, the catheter was disengaged and was pulled out over  the wire and was replaced with 6-French right Judkins catheter which was  advanced over the wire under fluoroscopic guidance to the ascending aorta.  The wire was pulled out, the catheter was aspirated and connected to the  manifold. Catheter was further advanced across aortic valve into the LV. LV  pressures were recorded. Next, LV graft was done in 30 degrees RAO position.  Post angiographic pressures were recorded from LV and then pullback  pressures were recorded from the aorta. There was no gradient across the  aortic valve. Next, a pigtail catheter was pulled down into the abdominal  aorta. Aortography was done in PA position. Next, a pigtail catheter was  pulled out over the wire. Sheaths aspirated and flushed.   FINDINGS:  LV showed good LV systolic function, EF of 99991111. Left main was  patent. LAD has 10-15% proximal stenosis. Diagonal #1 and #2 were very small  which were patent. Left circumflex has 20-30% proximal stenosis and 40-50%  mid-stenosis as before. High OM-1 was patent. OM-2 and OM-3 were small which  were  patent. Left circumflex proximally was  tortuous. RCA was patent. 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 The patient tolerated  the procedure well. There were no complications. The patient was transferred  to recovery room in stable condition.           ______________________________  Allegra Lai Terrence Dupont, M.D.     MNH/MEDQ  D:  02/27/2005  T:  02/28/2005  Job:  [id number]   cc:   Catheter Lab

## 2010-06-23 NOTE — Discharge Summary (Signed)
[name], [name]                ACCOUNT NO.:  [id number]   MEDICAL RECORD NO.:  [id number]          PATIENT TYPE:  INP   LOCATION:  3740                         FACILITY:  [facility]   PHYSICIAN:  Mohan N. Terrence Dupont, M.D. DATE OF BIRTH:  [birth year]   DATE OF ADMISSION:  02/25/2005  DATE OF DISCHARGE:  03/01/2005                                 DISCHARGE SUMMARY   ADMISSION DIAGNOSES:  1.  Stable angina, rule out myocardial infarction, rule out progression of      coronary artery disease.  2.  Hypertension.  3.  Non-insulin-dependent diabetes mellitus controlled by diet.  4.  Gastroesophageal reflux disease.  5.  History of tobacco abuse.  6.  Positive family history of coronary artery disease   DISCHARGE DIAGNOSES:  1.  Stable angina, status post left catheterization, myocardial infarction      ruled out.  2.  Coronary artery disease.  3.  Hypertension.  4.  Non-insulin-dependent diabetes mellitus controlled by diet.  5.  Gastroesophageal reflux disease.  6.  Urinary tract infection.  7.  Probable trichomonas vaginosis.  8.  History of tobacco abuse.  9.  Positive family history of coronary artery disease.   DISCHARGE HOME MEDICATIONS:  1.  Imdur 30 mg one tablet daily in the morning.  2.  Norvasc 5 mg one tablet daily.  3.  Toprol XL 25 mg one tablet daily.  4.  Baby aspirin 81 mg one tablet daily.  5.  Protonix 40 mg one tablet daily.  6.  Nitrostat sublingual 0.4 mg use as directed.  7.  Feosol 325 mg one tablet twice daily.  8.  Cipro 500 mg one tablet twice daily for five days.  9.  Flagyl 500 mg one tablet three times a day for seven days.   DIET:  Low salt, low cholesterol, avoid sweets.   Post cardiac cath instructions have been given.  Follow up with me in one  week.  CBC in one week.   CONDITION ON DISCHARGE:  Stable.   BRIEF HISTORY AND HOSPITAL COURSE:  [name] is a 60 year old black female  with past medical history significant for moderate coronary  artery disease,  hypertension, non-insulin-dependent diabetes mellitus controlled by diet,  GERD, history of tobacco abuse, history of bronchial asthma.  She came to  the ER complaining of left-sided chest pain described as pressure, grade  6/10, radiating to the left arm associated with mild shortness of breath  while getting ready to go to church.  States has been having chest pain off  and on for the last one month but today chest pain was severe.  She received  sublingual nitroglycerin, aspirin and then was started on heparin and IV  nitrates which relieved her chest pain.  Denies any nausea, vomiting,  diaphoresis.  Denies palpitations, lightheadedness or syncope.  No history  of PND, orthopnea or leg swelling.   PAST MEDICAL HISTORY:  As above.   PAST SURGICAL HISTORY:  1.  She had tubal ligation many years ago.  2.  She had left catheterization in December of 2005, which showed  moderate      stenosis in right-to-left circumflex in the range of 50 to 60%.   HOME MEDICATIONS:  1.  Toprol XL 25 mg p.o. daily.  2.  Norvasc 10 mg p.o. daily.  3.  Protonix 40 mg p.o. daily.  4.  Aspirin 81 mg p.o. daily.  5.  Nitroglycerin sublingual p.r.n.   SOCIAL HISTORY:  She is divorced, has four children, on disability.  She  smoked a half-pack per day for 30 years.  Drinks beer socially.  Born in  [place], [place].  Did housekeeping work.   FAMILY HISTORY:  Father died of cancer.  Mother is alive.  She had MI x3,  hypertension and diabetes.   PHYSICAL EXAMINATION:  GENERAL APPEARANCE:  She awake, alert and oriented  x3, in no acute distress.  VITAL SIGNS:  Blood pressure 129/75, pulse 60 and regular.  HEENT:  Conjunctivae pink.  NECK:  Supple.  No JVD, no bruit.  LUNGS:  Clear to auscultation without rhonchi or rales.  CARDIOVASCULAR:  S1 and S2 was normal. There was soft S4 gallop.  ABDOMEN:  Soft, bowel sounds present.  Nontender.  EXTREMITIES:  There was no clubbing, cyanosis,  or edema.   EKG showed normal sinus rhythm with T-wave inversion in high lateral leads  which were new from prior EKG and also no evidence of frank STT wave changes  in the anterolateral leads.   LABORATORY DATA:  Point of care CPK-MB was 2.2, troponin I 0.05. Cholesterol  119, LDL 68, HDL was slightly low at 34.  Cardiac enzymes three sets were  normal 92 CK, MB was 0.9, second set CK 73, MB 0.8, third set CK 67, MB 0.6,  troponin I three sets were 0.01, less than 0.01. Liver enzymes were normal.  Hemoglobin A1c was 12.6, fasting sugar was 95.  BUN 12, creatinine 1.1,  potassium 3.9.  Hemoglobin 12.6, hematocrit 37.9.  February 28, 2005,  hemoglobin 10.6, hematocrit  31.3.  Today hemoglobin was 10.2 and hematocrit  30.  Urinalysis showed 7 to 10 wbc, few bacteria.  Also urine showed many  epithelial cells and trichomonas.   BRIEF HOSPITAL COURSE:  Patient was admitted to telemetry unit.  MI was  ruled out by serial enzymes and EKG.  Patient subsequently left cardiac  catheterization, selective left and right coronary angiography via left  groin and we had problem accessing the right femoral artery as artery will  go into spasm.  Patient tolerated the procedure well.  There were no  complications.  Patient developed severe lower abdominal pain not associated  with dysuria and abdominal wall pain and drop in hemoglobin from 12.6 to  10.6, I believe due to hydration and the blood loss during the procedure and  __________  abdominal wall small hematoma.  Patient remained hemodynamically  stable during her hospital stay.  Repeat hemoglobin was stable.__________ he  was stable. There was no evidence of hematoma,  cyanosis or bruit.  Patient  has been ambulating in the hallway without any  problem.  The patient will be discharged home on the above medications and  will be followed up in my office in one week.  Patient also has been advised that her boyfriend should also be treated for  trichomonas.           ______________________________  Allegra Lai. Terrence Dupont, M.D.     MNH/MEDQ  D:  03/01/2005  T:  03/01/2005  Job:  [id number]

## 2010-06-23 NOTE — Discharge Summary (Signed)
[name], [name]                ACCOUNT NO.:  [id number]   MEDICAL RECORD NO.:  [id number]          PATIENT TYPE:  INP   LOCATION:  4707                         FACILITY:  [facility]   PHYSICIAN:  Mohan N. Terrence Dupont, M.D. DATE OF BIRTH:  Dec 28, 1950   DATE OF ADMISSION:  08/26/2007  DATE OF DISCHARGE:  08/29/2007                               DISCHARGE SUMMARY   ADMITTING DIAGNOSES:  1. Chest pain, rule out myocardial infarction, rule out restenosis.  2. Coronary artery disease, status post percutaneous transluminal      coronary angioplasty stenting to right coronary artery in April      2009 and status post percutaneous transluminal coronary angioplasty      stenting to left circumflex in April 2008.  3. Hypertension.  4. Diabetes mellitus.  5. Hypercholesteremia.  6. Gastroesophageal reflux disease.  7. History of colonic polyp.   DISCHARGE DIAGNOSES:  1. Stable angina, status post left catheterization.  2. Coronary artery disease.  3. History of percutaneous coronary intervention in the past as above.  4. Hypertension.  5. Diabetes mellitus.  6. Hypercholesteremia.  7. Anemia.  8. Gastroesophageal reflux disease.  9. Colonic polyp.   DISCHARGE HOME MEDICATIONS:  1. Enteric-coated aspirin 81 mg one tablet daily.  2. Avapro 150 mg daily.  3. Lipitor 20 mg daily.  4. Toprol-XL 50 mg daily.  5. Metformin 500 mg one tablet twice daily with food.  6. Norvasc 5 mg one tablet daily.  7. Omeprazole 20 mg twice daily.  8. Plavix 75 mg one daily with food.  9. Ferrous sulfate 1 tablet twice daily.  10.Nitrostat 0.4 mg sublingual use as directed.   The patient has been advised to hold metformin for 48 hours after the  cardiac cath.   DIET:  Low-salt, low-cholesterol 1800-calories ADA diet.   ACTIVITY:  Avoid any lifting, pushing, or pulling for 1 week.   Post cardiac cath instructions have been given.  Follow up with me in 1  week.   CONDITION AT DISCHARGE:  Stable.   BRIEF HISTORY AND HOSPITAL COURSE:  [name] is a 60 year old  black female with past medical history significant for coronary artery  disease, status post PTCA stenting to RCA in April 2009, hypertension,  diabetes mellitus, hypercholesteremia, history of colonic polyp, status  post partial colectomy, anemia, GERD, history of PTCA stenting to the  left circumflex in April 2008.  She came to the ER complaining of  retrosternal chest pain off and on since yesterday, described as  tightness pressure grade 6/10 associated with nausea and mild shortness  of breath.  Chest pain relieved with sublingual nitro in the ER.  She  states she ran out of Plavix for last 6 days.  Denies any prolonged  chest pain.  Denies chest pain at present.  Denies palpitation,  lightheadedness, or syncope.  Denies PND, orthopnea, or leg swelling.   PAST MEDICAL HISTORY:  As above.   PAST SURGICAL HISTORY:  She had tubal ligation in the past, had partial  colectomy in the past secondary to perforation of the colon.   ALLERGIES:  No known drug allergies.   MEDICATIONS AT HOME:  She is on:  1. Enteric-coated aspirin 81 mg p.o. daily.  2. Plavix 75 mg p.o. daily.  3. Lopressor 25 mg p.o. b.i.d.  4. Norvasc 5 mg p.o. daily.  5. Avapro 150 mg p.o. daily.  6. Lipitor 20 mg p.o. daily.  7. Metformin 500 mg p.o. b.i.d.  8. Omeprazole 20 mg p.o. b.i.d.  9. Nitrostat 0.4 mg sublingual p.r.n.   SOCIAL HISTORY:  She is divorced, has four children.  Smoked half pack  per day for 40 plus years.  Drank beer socially in the past  occasionally, worked in housekeeping department in the past.   FAMILY HISTORY:  Positive for coronary artery disease.   PHYSICAL EXAMINATION:  GENERAL:  She was alert, awake, and oriented x3  in no acute distress.  VITAL SIGNS:  Blood pressure was 132/62, pulse was 74 and regular.  Conjunctivae were pink.  NECK:  Supple.  No JVD, no bruit.  LUNGS:  Clear to auscultation without  rhonchi or rales.  CARDIOVASCULAR:  S1 and S2 was normal.  There was soft systolic murmur.  There was no S3 gallop or S4 gallop.  ABDOMEN:  Soft.  Bowel sounds were present, nontender.  EXTREMITIES:  There was no clubbing, cyanosis, or edema.   LABS:  Her CRP was very high at 20.4.  Cholesterol was 89, triglycerides  176, HDL was low at 27, and LDL was 27.  Three sets of cardiac enzymes  were negative.  CK was 72, MB 1.1; second set CK 57, MB 1.0; third set  CK 47, MB 0.8.  Troponin I three sets were less than 0.01.  Blood sugar  was 133, hemoglobin A1c was 5.9.  Sodium was 141, potassium 3.7,  chloride 104, bicarb 27, glucose 85, BUN 11, creatinine 1.10.  Her  hemoglobin was 12.1, hematocrit 36.6, white count of 7.1.  Persantine  Myoview done on August 27, 2007, showed small focus of mild reversible  ischemia in the apical segment of the inferolateral wall, mild  dyskinesis of the apical segment of the lateral wall with ejection  fraction of 57%.  EKG showed sinus tachycardia, nonspecific ST-T wave  changes.  Repeat EKG done on August 27, 2007, showed normal sinus rhythm  with no acute ischemic changes.   BRIEF HOSPITAL COURSE:  The patient was admitted to telemetry unit.  MI  was ruled out by serial enzymes and EKG.  The patient did not have any  further episode of anginal chest pain.  Subsequently, the patient  underwent Persantine Myoview on August 27, 2007, which showed small area  of ischemia as above.  The patient subsequently underwent left cardiac  cath on August 28, 2007, as per procedure report and was noted to have  mild coronary artery disease with patent stents.  Postprocedure, the  patient had one episode of atypical chest pain.  EKG showed no new acute  changes.  Her groin has been stable with no evidence of hematoma or  bruit.  The patient has been ambulating in the hallway without any chest  pain.  The patient is off heparin and nitrates and the patient was  discharged home  in stable condition on August 29, 2007.      Allegra Lai. Terrence Dupont, M.D.  Electronically Signed     MNH/MEDQ  D:  09/25/2007  T:  09/26/2007  Job:  [id number]

## 2010-06-30 ENCOUNTER — Ambulatory Visit (HOSPITAL_COMMUNITY)
Admission: RE | Admit: 2010-06-30 | Discharge: 2010-06-30 | Disposition: A | Discharge status: Home / Self Care | Payer: Medicaid Other | Source: Ambulatory Visit | Attending: Cardiology | Admitting: Cardiology

## 2010-06-30 ENCOUNTER — Encounter (HOSPITAL_COMMUNITY): Payer: Self-pay

## 2010-06-30 DIAGNOSIS — R079 Chest pain, unspecified: Secondary | ICD-10-CM | POA: Insufficient documentation

## 2010-06-30 DIAGNOSIS — R9439 Abnormal result of other cardiovascular function study: Secondary | ICD-10-CM | POA: Insufficient documentation

## 2010-06-30 DIAGNOSIS — R51 Headache: Secondary | ICD-10-CM | POA: Insufficient documentation

## 2010-06-30 DIAGNOSIS — E7800 Pure hypercholesterolemia, unspecified: Secondary | ICD-10-CM | POA: Insufficient documentation

## 2010-06-30 DIAGNOSIS — E119 Type 2 diabetes mellitus without complications: Secondary | ICD-10-CM | POA: Insufficient documentation

## 2010-06-30 DIAGNOSIS — R062 Wheezing: Secondary | ICD-10-CM | POA: Insufficient documentation

## 2010-06-30 DIAGNOSIS — R0602 Shortness of breath: Secondary | ICD-10-CM | POA: Insufficient documentation

## 2010-06-30 DIAGNOSIS — I10 Essential (primary) hypertension: Secondary | ICD-10-CM | POA: Insufficient documentation

## 2010-06-30 MED ORDER — TECHNETIUM TC 99M TETROFOSMIN IV KIT
30.0000 | PACK | Freq: Once | INTRAVENOUS | Status: AC | PRN
  Administered 2010-06-30: 30 via INTRAVENOUS

## 2010-06-30 MED ORDER — TECHNETIUM TC 99M TETROFOSMIN IV KIT
10.0000 | PACK | Freq: Once | INTRAVENOUS | Status: AC | PRN
  Administered 2010-06-30: 10 via INTRAVENOUS

## 2010-08-11 ENCOUNTER — Emergency Department (HOSPITAL_COMMUNITY)
Admission: EM | Admit: 2010-08-11 | Discharge: 2010-08-11 | Disposition: A | Discharge status: Home / Self Care | Payer: Medicaid Other | Attending: Emergency Medicine | Admitting: Emergency Medicine

## 2010-08-11 DIAGNOSIS — H5710 Ocular pain, unspecified eye: Secondary | ICD-10-CM | POA: Insufficient documentation

## 2010-08-11 DIAGNOSIS — E785 Hyperlipidemia, unspecified: Secondary | ICD-10-CM | POA: Insufficient documentation

## 2010-08-11 DIAGNOSIS — Z79899 Other long term (current) drug therapy: Secondary | ICD-10-CM | POA: Insufficient documentation

## 2010-08-11 DIAGNOSIS — H209 Unspecified iridocyclitis: Secondary | ICD-10-CM | POA: Insufficient documentation

## 2010-08-11 DIAGNOSIS — E119 Type 2 diabetes mellitus without complications: Secondary | ICD-10-CM | POA: Insufficient documentation

## 2010-08-11 DIAGNOSIS — Z87891 Personal history of nicotine dependence: Secondary | ICD-10-CM | POA: Insufficient documentation

## 2010-08-11 DIAGNOSIS — I2510 Atherosclerotic heart disease of native coronary artery without angina pectoris: Secondary | ICD-10-CM | POA: Insufficient documentation

## 2010-08-11 DIAGNOSIS — I10 Essential (primary) hypertension: Secondary | ICD-10-CM | POA: Insufficient documentation

## 2010-08-11 LAB — GLUCOSE, CAPILLARY: Glucose-Capillary: 100 mg/dL — ABNORMAL HIGH (ref 70–99)

## 2010-09-20 ENCOUNTER — Other Ambulatory Visit (HOSPITAL_COMMUNITY): Payer: Self-pay | Admitting: Family Medicine

## 2010-09-20 DIAGNOSIS — Z1231 Encounter for screening mammogram for malignant neoplasm of breast: Secondary | ICD-10-CM

## 2010-09-26 IMAGING — CR DG HIP W/ PELVIS BILAT
5 series · 5 of 5 positions shown · non-contrast
Comparison: Abdominal radiographs 03/10/2007.

CLINICAL DATA: Bilateral hip pain.  No known injury.

BILATERAL HIP WITH PELVIS - 4+ VIEW

[t pelvis a.p.]
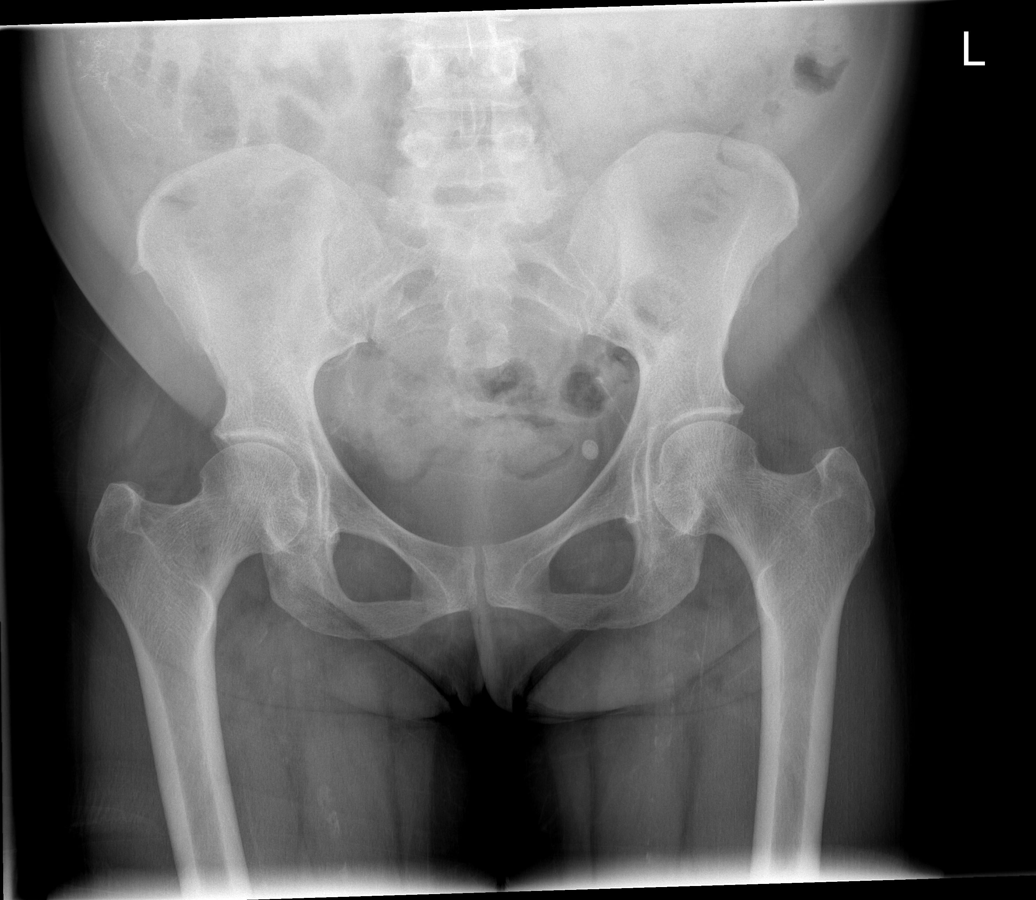

[t hip ap left]
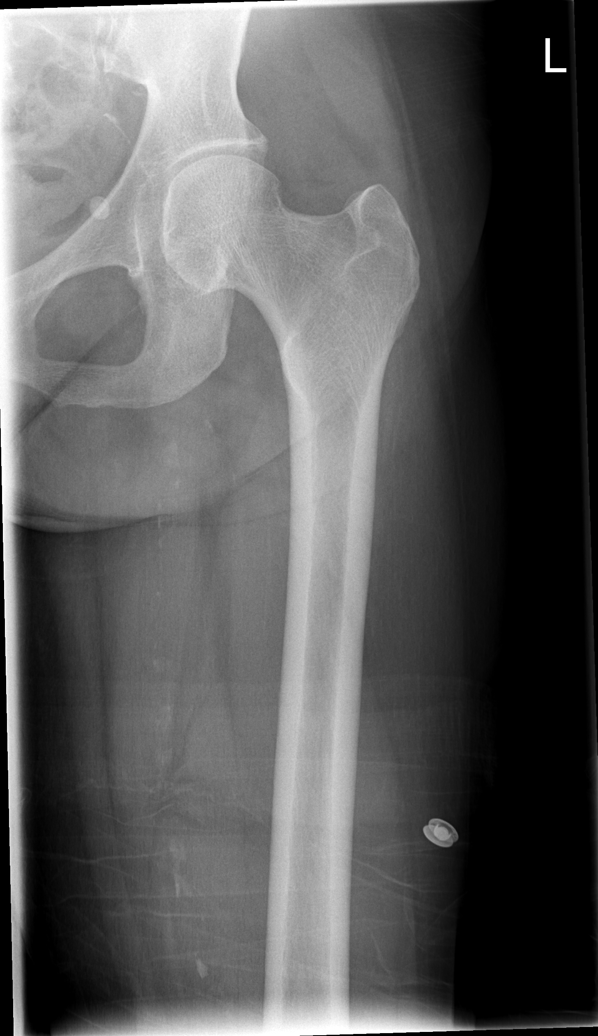

[t hip frog leg left]
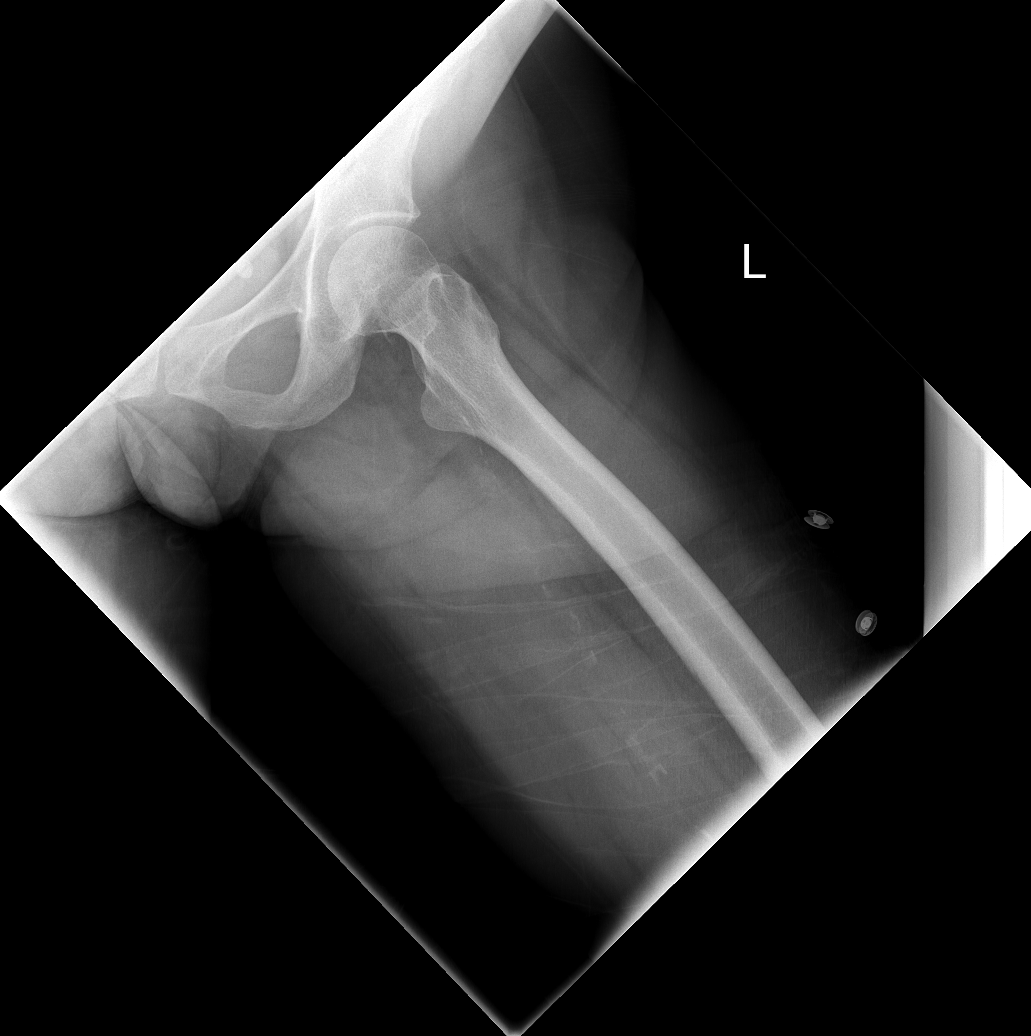

[t hip ap right]
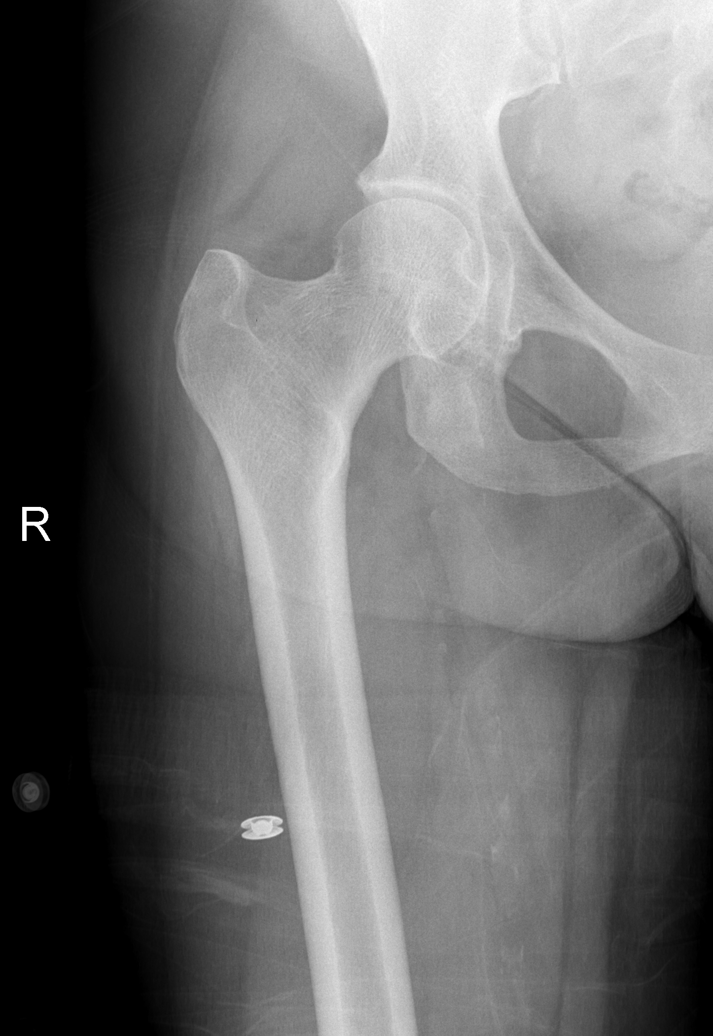

[t hip frog leg right]
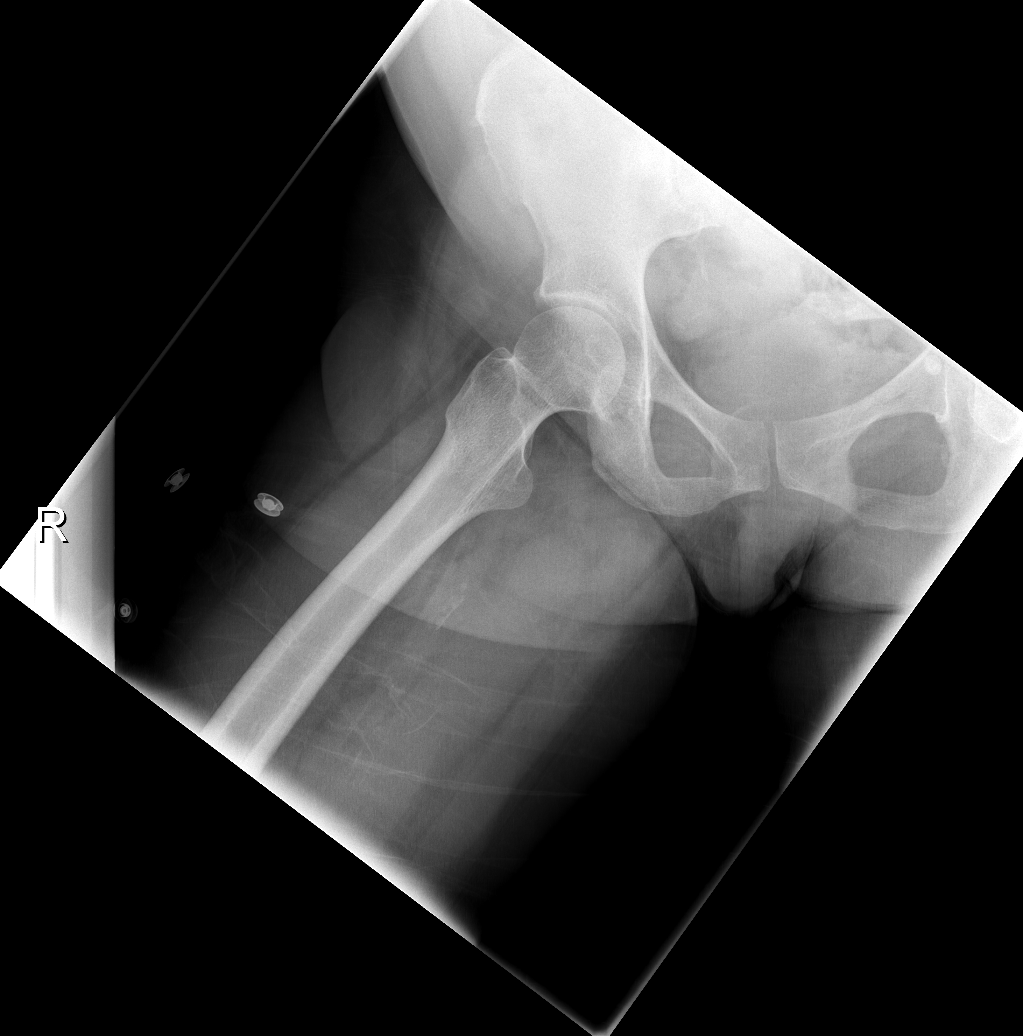

[5 of 5 positions shown; findings below may reference images not displayed]

FINDINGS: Mineralization and alignment are normal.  There is
evidence of acute fracture, dislocation or femoral head
osteonecrosis.  The hip joint spaces are preserved.  A small spur
projecting from the right iliac crest is unchanged from the prior
examinations.  Vascular calcifications are noted bilaterally.
There are bowel anastomoses clips in the right abdomen.
IMPRESSION: No acute osseous findings.

## 2010-10-17 ENCOUNTER — Ambulatory Visit (HOSPITAL_COMMUNITY)
Admission: RE | Admit: 2010-10-17 | Discharge: 2010-10-17 | Disposition: A | Discharge status: Home / Self Care | Payer: Medicaid Other | Source: Ambulatory Visit | Attending: Family Medicine | Admitting: Family Medicine

## 2010-10-17 DIAGNOSIS — Z1231 Encounter for screening mammogram for malignant neoplasm of breast: Secondary | ICD-10-CM | POA: Insufficient documentation

## 2010-10-18 ENCOUNTER — Emergency Department (HOSPITAL_COMMUNITY): Payer: Medicaid Other

## 2010-10-18 ENCOUNTER — Inpatient Hospital Stay (HOSPITAL_COMMUNITY)
Admission: EM | Admit: 2010-10-18 | Discharge: 2010-10-20 | DRG: 303 | Disposition: A | Discharge status: Home / Self Care | Payer: Medicaid Other | Attending: Cardiology | Admitting: Cardiology

## 2010-10-18 DIAGNOSIS — E7800 Pure hypercholesterolemia, unspecified: Secondary | ICD-10-CM | POA: Diagnosis present

## 2010-10-18 DIAGNOSIS — Z8249 Family history of ischemic heart disease and other diseases of the circulatory system: Secondary | ICD-10-CM

## 2010-10-18 DIAGNOSIS — Z79899 Other long term (current) drug therapy: Secondary | ICD-10-CM

## 2010-10-18 DIAGNOSIS — I2510 Atherosclerotic heart disease of native coronary artery without angina pectoris: Principal | ICD-10-CM | POA: Diagnosis present

## 2010-10-18 DIAGNOSIS — K219 Gastro-esophageal reflux disease without esophagitis: Secondary | ICD-10-CM | POA: Diagnosis present

## 2010-10-18 DIAGNOSIS — E119 Type 2 diabetes mellitus without complications: Secondary | ICD-10-CM | POA: Diagnosis present

## 2010-10-18 DIAGNOSIS — E785 Hyperlipidemia, unspecified: Secondary | ICD-10-CM | POA: Diagnosis present

## 2010-10-18 DIAGNOSIS — Z87891 Personal history of nicotine dependence: Secondary | ICD-10-CM

## 2010-10-18 DIAGNOSIS — I200 Unstable angina: Secondary | ICD-10-CM | POA: Diagnosis present

## 2010-10-18 DIAGNOSIS — I10 Essential (primary) hypertension: Secondary | ICD-10-CM | POA: Diagnosis present

## 2010-10-18 DIAGNOSIS — Z860100 Personal history of colon polyps, unspecified: Secondary | ICD-10-CM

## 2010-10-18 DIAGNOSIS — D649 Anemia, unspecified: Secondary | ICD-10-CM | POA: Diagnosis present

## 2010-10-18 DIAGNOSIS — Z7982 Long term (current) use of aspirin: Secondary | ICD-10-CM

## 2010-10-18 DIAGNOSIS — Z9861 Coronary angioplasty status: Secondary | ICD-10-CM

## 2010-10-18 LAB — BASIC METABOLIC PANEL
BUN: 10 mg/dL (ref 6–23)
CO2: 31 mEq/L (ref 19–32)
GFR calc Af Amer: >60 mL/min (ref >60)
GFR calc non Af Amer: 50 mL/min — ABNORMAL LOW (ref >60)
Potassium: 3.4 mEq/L — ABNORMAL LOW (ref 3.5–5.1)
Sodium: 144 mEq/L (ref 135–145)

## 2010-10-18 LAB — CBC
HCT: 31.2 % — ABNORMAL LOW (ref 36.0–46.0)
MCHC: 29.8 g/dL — ABNORMAL LOW (ref 30.0–36.0)
MCV: 82.1 fL (ref 78.0–100.0)
Platelets: 355 10*3/uL (ref 150–400)
RBC: 3.8 MIL/uL — ABNORMAL LOW (ref 3.87–5.11)
RDW: 17 % — ABNORMAL HIGH (ref 11.5–15.5)
WBC: 5.6 10*3/uL (ref 4.0–10.5)

## 2010-10-18 LAB — POCT I-STAT 3, ART BLOOD GAS (G3+)
Bicarbonate: 25.8 mEq/L — ABNORMAL HIGH (ref 20.0–24.0)
Patient temperature: 98.6
TCO2: 27 mmol/L (ref 0–100)
pO2, Arterial: 73 mmHg — ABNORMAL LOW (ref 80.0–100.0)

## 2010-10-18 LAB — CK TOTAL AND CKMB (NOT AT ARMC)
Relative Index: INVALID (ref 0.0–2.5)
Total CK: 89 U/L (ref 7–177)

## 2010-10-18 LAB — TROPONIN I: Troponin I: <0.3 ng/mL (ref <0.30)

## 2010-10-18 LAB — PRO B NATRIURETIC PEPTIDE: Pro B Natriuretic peptide (BNP): 42.2 pg/mL (ref 0–125)

## 2010-10-19 ENCOUNTER — Inpatient Hospital Stay (HOSPITAL_COMMUNITY): Payer: Medicaid Other

## 2010-10-19 LAB — COMPREHENSIVE METABOLIC PANEL WITH GFR
ALT: 9 U/L (ref 0–35)
AST: 11 U/L (ref 0–37)
Albumin: 3.3 g/dL — ABNORMAL LOW (ref 3.5–5.2)
Alkaline Phosphatase: 91 U/L (ref 39–117)
BUN: 11 mg/dL (ref 6–23)
CO2: 29 meq/L (ref 19–32)
Calcium: 8.8 mg/dL (ref 8.4–10.5)
Chloride: 104 meq/L (ref 96–112)
Creatinine, Ser: 0.96 mg/dL (ref 0.50–1.10)
GFR calc Af Amer: >60 mL/min
GFR calc non Af Amer: 59 mL/min — ABNORMAL LOW
Glucose, Bld: 158 mg/dL — ABNORMAL HIGH (ref 70–99)
Potassium: 3.6 meq/L (ref 3.5–5.1)
Sodium: 139 meq/L (ref 135–145)
Total Bilirubin: 0.3 mg/dL (ref 0.3–1.2)
Total Protein: 7.9 g/dL (ref 6.0–8.3)

## 2010-10-19 LAB — CARDIAC PANEL(CRET KIN+CKTOT+MB+TROPI)
CK, MB: 1.9 ng/mL (ref 0.3–4.0)
Relative Index: INVALID (ref 0.0–2.5)
Total CK: 58 U/L (ref 7–177)
Troponin I: <0.3 ng/mL (ref <0.30)

## 2010-10-19 LAB — GLUCOSE, CAPILLARY
Glucose-Capillary: 83 mg/dL (ref 70–99)
Glucose-Capillary: 86 mg/dL (ref 70–99)
Glucose-Capillary: 95 mg/dL (ref 70–99)
Glucose-Capillary: 202 mg/dL — ABNORMAL HIGH (ref 70–99)
Glucose-Capillary: 131 mg/dL — ABNORMAL HIGH (ref 70–99)

## 2010-10-19 LAB — CBC
HCT: 28.5 % — ABNORMAL LOW (ref 36.0–46.0)
Hemoglobin: 8.3 g/dL — ABNORMAL LOW (ref 12.0–15.0)
MCH: 24.3 pg — ABNORMAL LOW (ref 26.0–34.0)
MCHC: 29.1 g/dL — ABNORMAL LOW (ref 30.0–36.0)
MCV: 83.6 fL (ref 78.0–100.0)
Platelets: 333 K/uL (ref 150–400)
RBC: 3.41 MIL/uL — ABNORMAL LOW (ref 3.87–5.11)
RDW: 17.3 % — ABNORMAL HIGH (ref 11.5–15.5)
WBC: 7.4 K/uL (ref 4.0–10.5)

## 2010-10-19 LAB — BASIC METABOLIC PANEL
BUN: 13 mg/dL (ref 6–23)
Calcium: 9.3 mg/dL (ref 8.4–10.5)
GFR calc non Af Amer: >60 mL/min (ref >60)
Glucose, Bld: 82 mg/dL (ref 70–99)
Potassium: 3.8 mEq/L (ref 3.5–5.1)

## 2010-10-19 LAB — APTT: aPTT: 106 seconds — ABNORMAL HIGH (ref 24–37)

## 2010-10-19 LAB — HEMOGLOBIN A1C
Hgb A1c MFr Bld: 6.5 % — ABNORMAL HIGH
Mean Plasma Glucose: 140 mg/dL — ABNORMAL HIGH

## 2010-10-19 LAB — PROTIME-INR
INR: 1.02 (ref 0.00–1.49)
Prothrombin Time: 13.6 s (ref 11.6–15.2)

## 2010-10-19 LAB — IRON AND TIBC
Iron: 18 ug/dL — ABNORMAL LOW (ref 42–135)
TIBC: 345 ug/dL (ref 250–470)

## 2010-10-19 LAB — HEPARIN LEVEL (UNFRACTIONATED): Heparin Unfractionated: <0.1 [IU]/mL — ABNORMAL LOW (ref 0.30–0.70)

## 2010-10-19 MED ORDER — TECHNETIUM TC 99M TETROFOSMIN IV KIT
30.0000 | PACK | Freq: Once | INTRAVENOUS | Status: AC | PRN
  Administered 2010-10-19: 30 via INTRAVENOUS

## 2010-10-19 MED ORDER — TECHNETIUM TC 99M TETROFOSMIN IV KIT
10.0000 | PACK | Freq: Once | INTRAVENOUS | Status: AC | PRN
  Administered 2010-10-19: 10 via INTRAVENOUS

## 2010-10-20 LAB — GLUCOSE, CAPILLARY
Glucose-Capillary: 102 mg/dL — ABNORMAL HIGH (ref 70–99)
Glucose-Capillary: 151 mg/dL — ABNORMAL HIGH (ref 70–99)

## 2010-10-26 LAB — I-STAT 8, (EC8 V) (CONVERTED LAB)
BUN: 9
Bicarbonate: 28.3 — ABNORMAL HIGH
Glucose, Bld: 102 — ABNORMAL HIGH
Hemoglobin: 11.6 — ABNORMAL LOW
Potassium: 3.7
Sodium: 140

## 2010-10-26 LAB — BASIC METABOLIC PANEL
BUN: 5 — ABNORMAL LOW
CO2: 28
Chloride: 104
Creatinine, Ser: 1.04
Creatinine, Ser: 1.06
GFR calc Af Amer: >60
GFR calc Af Amer: >60
GFR calc non Af Amer: 53 — ABNORMAL LOW
Sodium: 137

## 2010-10-26 LAB — LIPID PANEL
Cholesterol: 116
LDL Cholesterol: 50
Total CHOL/HDL Ratio: 3.2
VLDL: 30

## 2010-10-26 LAB — CBC
HCT: 27.7 — ABNORMAL LOW
HCT: 31.9 — ABNORMAL LOW
Hemoglobin: 8.8 — ABNORMAL LOW
Hemoglobin: 9.5 — ABNORMAL LOW
Hemoglobin: 9.6 — ABNORMAL LOW
MCHC: 30.1
MCHC: 31.6
MCV: 83.7
MCV: 86.4
Platelets: 370
RBC: 3.3 — ABNORMAL LOW
RBC: 3.52 — ABNORMAL LOW
RBC: 3.58 — ABNORMAL LOW
RDW: 15.2
RDW: 16 — ABNORMAL HIGH
WBC: 4.4
WBC: 5.7

## 2010-10-26 LAB — COMPREHENSIVE METABOLIC PANEL
Albumin: 3.4 — ABNORMAL LOW
Alkaline Phosphatase: 107
BUN: 7
BUN: 9
Calcium: 9
Calcium: 9.2
Chloride: 103
Creatinine, Ser: 1
GFR calc Af Amer: >60
Glucose, Bld: 88
Potassium: 3.5
Total Bilirubin: 0.4
Total Protein: 7.9
Total Protein: 8.8 — ABNORMAL HIGH

## 2010-10-26 LAB — CARDIAC PANEL(CRET KIN+CKTOT+MB+TROPI)
CK, MB: 0.9
Relative Index: INVALID
Relative Index: INVALID
Total CK: 71
Troponin I: 0.02
Troponin I: 0.02

## 2010-10-26 LAB — DIFFERENTIAL
Eosinophils Absolute: 0.1
Lymphs Abs: 2.5
Monocytes Absolute: 0.7
Monocytes Relative: 13 — ABNORMAL HIGH
Neutrophils Relative %: 39 — ABNORMAL LOW

## 2010-10-26 LAB — OCCULT BLOOD X 1 CARD TO LAB, STOOL: Fecal Occult Bld: NEGATIVE

## 2010-10-26 LAB — APTT: aPTT: 27

## 2010-10-26 LAB — POCT CARDIAC MARKERS
CKMB, poc: 2.3
Operator id: 146091
Operator id: 146091
Troponin i, poc: <0.05
Troponin i, poc: <0.05

## 2010-10-26 LAB — POCT I-STAT CREATININE: Creatinine, Ser: 1

## 2010-10-27 LAB — URINE MICROSCOPIC-ADD ON

## 2010-10-27 LAB — BASIC METABOLIC PANEL
BUN: 25 — ABNORMAL HIGH
BUN: 28 — ABNORMAL HIGH
BUN: 29 — ABNORMAL HIGH
BUN: 32 — ABNORMAL HIGH
BUN: 33 — ABNORMAL HIGH
BUN: 37 — ABNORMAL HIGH
BUN: 6
BUN: 7
BUN: 9
BUN: 10
CO2: 24
CO2: 25
CO2: 26
CO2: 27
CO2: 28
CO2: 30
CO2: 22
CO2: 23
CO2: 24
Calcium: 8.6
Calcium: 8.7
Calcium: 8.8
Calcium: 8.8
Calcium: 9
Calcium: 8.9
Chloride: 100
Chloride: 102
Chloride: 103
Chloride: 103
Chloride: 104
Chloride: 104
Chloride: 104
Chloride: 108
Chloride: 110
Chloride: 104
Chloride: 107
Creatinine, Ser: 0.87
Creatinine, Ser: 1.1
Creatinine, Ser: 1.1
Creatinine, Ser: 1.13
Creatinine, Ser: 1.79 — ABNORMAL HIGH
Creatinine, Ser: 1.93 — ABNORMAL HIGH
Creatinine, Ser: 1.96 — ABNORMAL HIGH
Creatinine, Ser: 2.1 — ABNORMAL HIGH
Creatinine, Ser: 2.36 — ABNORMAL HIGH
Creatinine, Ser: 2.93 — ABNORMAL HIGH
Creatinine, Ser: 1.41 — ABNORMAL HIGH
GFR calc Af Amer: 20 — ABNORMAL LOW
GFR calc Af Amer: 29 — ABNORMAL LOW
GFR calc Af Amer: 32 — ABNORMAL LOW
GFR calc Af Amer: 60 — ABNORMAL LOW
GFR calc Af Amer: >60
GFR calc Af Amer: >60
GFR calc Af Amer: 49 — ABNORMAL LOW
GFR calc Af Amer: 49 — ABNORMAL LOW
GFR calc non Af Amer: 20 — ABNORMAL LOW
GFR calc non Af Amer: 21 — ABNORMAL LOW
GFR calc non Af Amer: 24 — ABNORMAL LOW
GFR calc non Af Amer: 29 — ABNORMAL LOW
GFR calc non Af Amer: >60
Glucose, Bld: 111 — ABNORMAL HIGH
Glucose, Bld: 117 — ABNORMAL HIGH
Glucose, Bld: 118 — ABNORMAL HIGH
Glucose, Bld: 126 — ABNORMAL HIGH
Glucose, Bld: 74
Glucose, Bld: 86
Glucose, Bld: 88
Glucose, Bld: 94
Potassium: 3.7
Potassium: 3.9
Potassium: 4
Potassium: 4
Potassium: 4.1
Potassium: 4.1
Potassium: 4.5
Potassium: 4.7
Potassium: 3.6
Potassium: 4
Sodium: 133 — ABNORMAL LOW
Sodium: 135
Sodium: 140
Sodium: 133 — ABNORMAL LOW
Sodium: 136

## 2010-10-27 LAB — CBC
HCT: 24.5 — ABNORMAL LOW
HCT: 29.2 — ABNORMAL LOW
HCT: 29.9 — ABNORMAL LOW
HCT: 30 — ABNORMAL LOW
HCT: 31.7 — ABNORMAL LOW
HCT: 32.7 — ABNORMAL LOW
HCT: 32.8 — ABNORMAL LOW
HCT: 30.7 — ABNORMAL LOW
Hemoglobin: 10.4 — ABNORMAL LOW
Hemoglobin: 10.5 — ABNORMAL LOW
Hemoglobin: 10.6 — ABNORMAL LOW
Hemoglobin: 7.8 — CL
Hemoglobin: 9.5 — ABNORMAL LOW
Hemoglobin: 9.9 — ABNORMAL LOW
Hemoglobin: 10.1 — ABNORMAL LOW
MCHC: 31.9
MCHC: 31.9
MCHC: 32.1
MCHC: 32.4
MCHC: 32.6
MCHC: 32.7
MCHC: 33.1
MCHC: 33.1
MCHC: 33.3
MCHC: 33.5
MCHC: 32.6
MCHC: 33
MCV: 83.3
MCV: 83.3
MCV: 83.6
MCV: 83.8
MCV: 83.9
MCV: 84
MCV: 84.2
MCV: 84.7
MCV: 84.8
MCV: 85.1
MCV: 85.3
MCV: 84.2
MCV: 84.5
Platelets: 333
Platelets: 360
Platelets: 364
Platelets: 386
Platelets: 432 — ABNORMAL HIGH
Platelets: 514 — ABNORMAL HIGH
Platelets: 559 — ABNORMAL HIGH
Platelets: 550 — ABNORMAL HIGH
Platelets: 580 — ABNORMAL HIGH
RBC: 3.27 — ABNORMAL LOW
RBC: 3.56 — ABNORMAL LOW
RBC: 3.58 — ABNORMAL LOW
RBC: 3.62 — ABNORMAL LOW
RBC: 3.69 — ABNORMAL LOW
RBC: 3.78 — ABNORMAL LOW
RBC: 3.79 — ABNORMAL LOW
RBC: 3.35 — ABNORMAL LOW
RBC: 3.55 — ABNORMAL LOW
RBC: 3.63 — ABNORMAL LOW
RDW: 15.3
RDW: 15.5
RDW: 15.7 — ABNORMAL HIGH
RDW: 15.8 — ABNORMAL HIGH
RDW: 16.3 — ABNORMAL HIGH
RDW: 16.6 — ABNORMAL HIGH
RDW: 17.2 — ABNORMAL HIGH
RDW: 17.2 — ABNORMAL HIGH
RDW: 17.5 — ABNORMAL HIGH
WBC: 10
WBC: 10.2
WBC: 10.3
WBC: 10.3
WBC: 10.9 — ABNORMAL HIGH
WBC: 11 — ABNORMAL HIGH
WBC: 11.1 — ABNORMAL HIGH
WBC: 11.4 — ABNORMAL HIGH
WBC: 13.6 — ABNORMAL HIGH
WBC: 5.7
WBC: 9.7
WBC: 13.1 — ABNORMAL HIGH

## 2010-10-27 LAB — DIFFERENTIAL
Basophils Absolute: 0
Basophils Absolute: 0
Basophils Absolute: 0.1
Basophils Absolute: 0.1
Basophils Relative: 0
Basophils Relative: 0
Eosinophils Absolute: 0.1
Eosinophils Absolute: 0.2
Eosinophils Absolute: 0.3
Eosinophils Relative: 0
Eosinophils Relative: 1
Eosinophils Relative: 3
Lymphocytes Relative: 10 — ABNORMAL LOW
Lymphocytes Relative: 12
Lymphocytes Relative: 13
Lymphs Abs: 1.1
Monocytes Absolute: 0.9
Monocytes Absolute: 1.2 — ABNORMAL HIGH
Monocytes Relative: 12
Monocytes Relative: 9
Neutro Abs: 7.8 — ABNORMAL HIGH
Neutro Abs: 8.2 — ABNORMAL HIGH
Neutro Abs: 8.5 — ABNORMAL HIGH
Neutrophils Relative %: 74
Neutrophils Relative %: 80 — ABNORMAL HIGH

## 2010-10-27 LAB — COMPREHENSIVE METABOLIC PANEL
ALT: 16
ALT: 23
AST: 18
AST: 23
Alkaline Phosphatase: 99
CO2: 27
Calcium: 8.7
Calcium: 9
Chloride: 105
GFR calc Af Amer: 47 — ABNORMAL LOW
GFR calc Af Amer: >60
GFR calc non Af Amer: 39 — ABNORMAL LOW
Glucose, Bld: 107 — ABNORMAL HIGH
Glucose, Bld: 135 — ABNORMAL HIGH
Sodium: 137
Sodium: 138
Total Bilirubin: 1.4 — ABNORMAL HIGH
Total Protein: 7.4

## 2010-10-27 LAB — CROSSMATCH: ABO/RH(D): AB POS

## 2010-10-27 LAB — URINALYSIS, ROUTINE W REFLEX MICROSCOPIC
Glucose, UA: NEGATIVE
Protein, ur: 30 — AB
Specific Gravity, Urine: 1.024

## 2010-10-27 LAB — EXPECTORATED SPUTUM ASSESSMENT W GRAM STAIN, RFLX TO RESP C

## 2010-10-27 LAB — AMYLASE: Amylase: 140 — ABNORMAL HIGH

## 2010-10-27 LAB — TRIGLYCERIDES: Triglycerides: 89

## 2010-10-27 LAB — CULTURE, BLOOD (ROUTINE X 2): Culture: NO GROWTH

## 2010-10-27 LAB — MAGNESIUM
Magnesium: 2.2
Magnesium: 2.6 — ABNORMAL HIGH

## 2010-10-27 LAB — PREPARE PLATELET PHERESIS

## 2010-10-27 LAB — URINE CULTURE: Colony Count: NO GROWTH

## 2010-10-27 LAB — CULTURE, RESPIRATORY W GRAM STAIN

## 2010-10-27 LAB — CHOLESTEROL, TOTAL: Cholesterol: 89

## 2010-10-27 LAB — LIPASE, BLOOD: Lipase: 42

## 2010-10-30 ENCOUNTER — Telehealth: Payer: Self-pay | Admitting: Internal Medicine

## 2010-10-30 NOTE — Telephone Encounter (Signed)
Left message for pt to call back.  Pt is having abdominal pain and requesting to be seen. Pt scheduled to see Amy Esterwood PA 11/01/10@11am . Catalina Lunger to notify pt of appt date and time.

## 2010-10-31 LAB — DIFFERENTIAL
Basophils Absolute: 0.1
Basophils Relative: 2 — ABNORMAL HIGH
Eosinophils Absolute: 0.1
Lymphocytes Relative: 35
Lymphs Abs: 2.8
Lymphs Abs: 3.3
Monocytes Relative: 7
Neutro Abs: 3.4
Neutro Abs: 4.5
Neutrophils Relative %: 45
Neutrophils Relative %: 56

## 2010-10-31 LAB — CBC
HCT: 28.1 — ABNORMAL LOW
HCT: 28.4 — ABNORMAL LOW
Hemoglobin: 9.1 — ABNORMAL LOW
Hemoglobin: 9.4 — ABNORMAL LOW
MCHC: 32.3
MCHC: 32.5
MCHC: 33.1
MCV: 88.4
MCV: 88.7
Platelets: 339
Platelets: 388
Platelets: 401 — ABNORMAL HIGH
RBC: 3.17 — ABNORMAL LOW
RBC: 3.56 — ABNORMAL LOW
RDW: 17 — ABNORMAL HIGH
RDW: 17.2 — ABNORMAL HIGH
RDW: 17.5 — ABNORMAL HIGH
WBC: 7.4
WBC: 8

## 2010-10-31 LAB — COMPREHENSIVE METABOLIC PANEL
Albumin: 3.2 — ABNORMAL LOW
BUN: 15
Calcium: 8.9
Glucose, Bld: 144 — ABNORMAL HIGH
Total Protein: 7.5

## 2010-10-31 LAB — CK TOTAL AND CKMB (NOT AT ARMC)
CK, MB: 0.9
CK, MB: 0.9
Relative Index: INVALID
Relative Index: INVALID
Relative Index: INVALID
Total CK: 31
Total CK: 41

## 2010-10-31 LAB — URIC ACID: Uric Acid, Serum: 7.3 — ABNORMAL HIGH

## 2010-10-31 LAB — BASIC METABOLIC PANEL
BUN: 8
CO2: 26
Calcium: 9.1
Calcium: 9.3
Creatinine, Ser: 0.9
GFR calc Af Amer: >60
GFR calc non Af Amer: >60
Glucose, Bld: 97
Potassium: 3.7
Sodium: 142

## 2010-10-31 LAB — TSH: TSH: 0.839

## 2010-10-31 LAB — IRON AND TIBC
Saturation Ratios: 18 — ABNORMAL LOW
TIBC: 315

## 2010-10-31 LAB — B-NATRIURETIC PEPTIDE (CONVERTED LAB): Pro B Natriuretic peptide (BNP): <30

## 2010-10-31 LAB — TROPONIN I: Troponin I: 0.01

## 2010-10-31 LAB — POCT CARDIAC MARKERS
Myoglobin, poc: 59.3
Operator id: 285491
Troponin i, poc: <0.05

## 2010-10-31 LAB — POCT I-STAT, CHEM 8
BUN: 19
Calcium, Ion: 1.18
Hemoglobin: 11.9 — ABNORMAL LOW
Sodium: 142
TCO2: 27

## 2010-10-31 LAB — RETICULOCYTES
RBC.: 3.28 — ABNORMAL LOW
Retic Ct Pct: 2.3

## 2010-10-31 LAB — PROTIME-INR
INR: 0.9
Prothrombin Time: 12

## 2010-10-31 LAB — FERRITIN: Ferritin: 20 (ref 10–291)

## 2010-11-01 ENCOUNTER — Ambulatory Visit: Payer: Medicaid Other | Admitting: Physician Assistant

## 2010-11-01 ENCOUNTER — Encounter: Payer: Self-pay | Admitting: Physician Assistant

## 2010-11-01 ENCOUNTER — Ambulatory Visit (INDEPENDENT_AMBULATORY_CARE_PROVIDER_SITE_OTHER): Payer: Medicaid Other | Admitting: Physician Assistant

## 2010-11-01 VITALS — BP 136/68 | HR 88 | Ht 59.0 in | Wt 137.0 lb

## 2010-11-01 DIAGNOSIS — K8020 Calculus of gallbladder without cholecystitis without obstruction: Secondary | ICD-10-CM | POA: Insufficient documentation

## 2010-11-01 DIAGNOSIS — R1013 Epigastric pain: Secondary | ICD-10-CM

## 2010-11-01 DIAGNOSIS — R1011 Right upper quadrant pain: Secondary | ICD-10-CM

## 2010-11-01 MED ORDER — TRAMADOL HCL 50 MG PO TABS: ORAL_TABLET | ORAL | Status: DC

## 2010-11-01 MED ORDER — ONDANSETRON HCL 4 MG PO TABS: ORAL_TABLET | ORAL | Status: DC

## 2010-11-01 NOTE — Progress Notes (Signed)
Subjective:    Patient ID: [name], female    DOB: 1950/05/30, 60 y.o.   MRN: [id number]  HPI [name] is a 60 year old African female known to Dr. Delfin Edis  who has history of multiple colon polyps and GERD. She also has previously documented asymptomatic cholelithiasis. She is status post right hemicolectomy following a post polypectomy perforation and 2009. She last had a colonoscopy in March of 2011 with removal of multiple polyps. Path on these consistent with hyperplastic polyps and she is to followup in 5 years.  Patient comes in today with complaints of epigastric pain intermittently over the past couple of months. She states that her pain is exacerbated after eating and may last for half an hour to an hour. She describes it as a pressure-type sensation that radiates into her right side. She has had nausea with this but no vomiting. Her appetite has been fine she says her weight goes up and down. Her bowel movements have been normal. She has noticed some dark stools the past few days but has been taking Pepto-Bismol for her abdominal pain. She has not had any fever or chills. She is on a baby aspirin daily and also on Plavix. She denies any NSAID use. She says that she has been taking Pepcid 20 mg once daily.  Review of records shows prior endoscopy in 2009 per Dr. Benson Norway was unremarkable and abdominal ultrasound was done approximately 2 years ago showing a single gallstone  Labs are reviewed from recent hospitalization 10/19/2010 WBC of 7.4 hemoglobin 8.3 hematocrit 28.5 MCV of 83.6 CK MBs were negative, electrolytes within normal limits, serum iron 18 TIBC of 345 and iron saturation of 5 ALT ,folate 15.4, B12 308, liver function studies were normal, albumin 3.3 HGBa1c  6.5. She was hospitalized with complaints of chest pain and was on the cardiology service. She did have a nuclear imaging which was negative for ischemia and showed an ejection fraction of approximately 61%    Review of  Systems  Constitutional: Negative.   HENT: Negative.   Eyes: Negative.   Respiratory: Negative.   Cardiovascular: Negative.   Gastrointestinal: Positive for nausea and abdominal pain.  Genitourinary: Negative.   Musculoskeletal: Negative.   Skin: Negative.   Neurological: Negative.   Hematological: Negative.   Psychiatric/Behavioral: Negative.    Outpatient Prescriptions Prior to Visit  Medication Sig Dispense Refill  . amLODipine (NORVASC) 5 MG tablet Take 5 mg by mouth daily.        Marland Kitchen aspirin 81 MG tablet Take 81 mg by mouth daily.        . clopidogrel (PLAVIX) 75 MG tablet Take 75 mg by mouth daily.        . irbesartan (AVAPRO) 150 MG tablet Take 150 mg by mouth daily.        Marland Kitchen METFORMIN HCL PO Take 1 mg by mouth daily. Take 1 daily, 500 mg tablet       . metoprolol tartrate (LOPRESSOR) 25 MG tablet Take 25 mg by mouth 2 (two) times daily. Take 1 tab daily       . nitroGLYCERIN (NITROSTAT) 0.4 MG SL tablet Place 0.4 mg under the tongue every 5 (five) minutes as needed. As needed       . esomeprazole (NEXIUM) 40 MG capsule Take 40 mg by mouth daily before breakfast. Take 1 cap once daily       . simvastatin (ZOCOR) 20 MG tablet Take 20 mg by mouth at bedtime. Take 1 tab  once daily       . traMADol (ULTRAM) 50 MG tablet Take 50 mg by mouth every 6 (six) hours as needed. Take 1 taablet as needed for pain        No Known Allergies Patient Active Problem List  Diagnoses  . DM  . HYPERLIPIDEMIA  . HYPERTENSION  . MYOCARDIAL INFARCTION  . CORONARY ARTERY DISEASE  . GERD  . FATTY LIVER DISEASE  . COLONIC POLYPS, HX OF  . Cholelithiasis       Objective:   Physical Exam Well-developed small African American female in no acute distress, pleasant HEENT; nontraumatic normocephalic EOMI PERRLA sclera anicteric, Neck; supple no JVD, Cardiovascular; regular rate and rhythm with S1-S2 no murmur rub or gallop, Pulmonary; clear bilaterally, Abdomen; soft, bowel sounds active, tender in the  epigastrium and right upper quadrant there is no guarding no rebound ,no palpable mass or hepatosplenomegaly ,she does have a long midline incisional scar, no appreciable hernia, Rectal ;Brown stool Hemoccult negative, Extremities; no clubbing cyanosis or edema skin warm and dry, Psych; mood and affect appropriate.       Assessment & Plan:  #8 60 year old African female with intermittent epigastric and right upper quadrant pain associated with nausea over the past few months. Rule out aspirin/Plavix-induced gastropathy or peptic ulcer disease, rule out cholecystitis.  Plan; increase Pepcid to 20 mg by mouth twice daily Add Zofran 4 mg every 6-8 hours as needed for nausea Patient request something for pain and will try Ultram 50 mg every 6-8 hours as needed for severe pain only Schedule for upper abdominal ultrasound Schedule for upper endoscopy with Dr. Olevia Perches. Procedure was discussed in detail with patient and she will remain on her Plavix.  #2 Iron deficiency anemia, labs from recent hospitalization reviewed. Today she is Hemoccult-negative. Upper endoscopy scheduled as above, patient is up-to-date with colonoscopy. She has appointment to see Dr. Terrence Dupont  later this week and will defer treatment of her iron deficiency to Dr. Terrence Dupont  since labs were ordered at the time she was in the hospital last week  #3 History of coronary artery disease status post stent placement  #4 adult-onset diabetes mellitus.

## 2010-11-01 NOTE — Patient Instructions (Addendum)
You have been given a separate informational sheet regarding your tobacco use, the importance of quitting and local resources to help you quit.  We have sent prescriptions to [facility] in [place], [place]. For Zofran and Ultram. We scheduled the Ultrasound for Thurs 9-27. We scheduled the Endoscopy with Dr. Olevia Perches on 11-30-2010.  Stay on Pepcid twice daily.

## 2010-11-02 ENCOUNTER — Telehealth: Payer: Self-pay | Admitting: *Deleted

## 2010-11-02 ENCOUNTER — Ambulatory Visit (HOSPITAL_COMMUNITY)
Admission: RE | Admit: 2010-11-02 | Discharge: 2010-11-02 | Disposition: A | Discharge status: Home / Self Care | Payer: Medicaid Other | Source: Ambulatory Visit | Attending: Physician Assistant | Admitting: Physician Assistant

## 2010-11-02 DIAGNOSIS — R1013 Epigastric pain: Secondary | ICD-10-CM | POA: Insufficient documentation

## 2010-11-02 DIAGNOSIS — N1330 Unspecified hydronephrosis: Secondary | ICD-10-CM | POA: Insufficient documentation

## 2010-11-02 DIAGNOSIS — E119 Type 2 diabetes mellitus without complications: Secondary | ICD-10-CM | POA: Insufficient documentation

## 2010-11-02 DIAGNOSIS — I10 Essential (primary) hypertension: Secondary | ICD-10-CM | POA: Insufficient documentation

## 2010-11-02 DIAGNOSIS — K8020 Calculus of gallbladder without cholecystitis without obstruction: Secondary | ICD-10-CM | POA: Insufficient documentation

## 2010-11-02 DIAGNOSIS — R1011 Right upper quadrant pain: Secondary | ICD-10-CM

## 2010-11-02 NOTE — Telephone Encounter (Signed)
Message copied by Hulan Saas on Thu Nov 02, 2010  3:36 PM ------      Message from: [place], [place] S      Created: Thu Nov 02, 2010  2:44 PM       Please let pt know the US shows  Gallstone but no sign of infected gallbladder-let's see what endoscopy shows

## 2010-11-02 NOTE — Progress Notes (Signed)
Agree with Ms. Esterwood's assessment and plan. Carl E. Gessner, MD, FACG   

## 2010-11-02 NOTE — Telephone Encounter (Signed)
Patient given results and recommendation by Nicoletta Ba, PA

## 2010-11-03 ENCOUNTER — Encounter: Payer: Self-pay | Admitting: Internal Medicine

## 2010-11-03 LAB — DIFFERENTIAL
Basophils Absolute: 0
Basophils Relative: 1
Monocytes Absolute: 0.6
Neutro Abs: 3.9

## 2010-11-03 LAB — CK TOTAL AND CKMB (NOT AT ARMC)
CK, MB: 1.1
Relative Index: INVALID
Total CK: 72

## 2010-11-03 LAB — CARDIAC PANEL(CRET KIN+CKTOT+MB+TROPI)
CK, MB: 1
Relative Index: INVALID
Relative Index: INVALID
Total CK: 57
Troponin I: <0.01

## 2010-11-03 LAB — COMPREHENSIVE METABOLIC PANEL
AST: 15
Albumin: 3.5
Calcium: 9.2
Creatinine, Ser: 1.1
GFR calc Af Amer: >60
Total Protein: 7.3

## 2010-11-03 LAB — POCT I-STAT, CHEM 8
Calcium, Ion: 1.18
Chloride: 104
HCT: 39
TCO2: 26

## 2010-11-03 LAB — CBC
HCT: 36.8
Hemoglobin: 12.1
MCHC: 33
MCHC: 33.1
MCHC: 33.1
MCV: 91.3
Platelets: 233
Platelets: 252
Platelets: 264
RBC: 3.64 — ABNORMAL LOW
RDW: 16.5 — ABNORMAL HIGH
RDW: 16.9 — ABNORMAL HIGH
WBC: 7.9

## 2010-11-03 LAB — POCT CARDIAC MARKERS
CKMB, poc: 1.3
Myoglobin, poc: 72.6
Operator id: 284251

## 2010-11-03 LAB — BASIC METABOLIC PANEL
GFR calc Af Amer: >60
Potassium: 3.4 — ABNORMAL LOW
Sodium: 137

## 2010-11-03 LAB — PROTIME-INR
INR: 1
Prothrombin Time: 12.9

## 2010-11-03 LAB — HIGH SENSITIVITY CRP: CRP, High Sensitivity: 20.4 — ABNORMAL HIGH

## 2010-11-03 LAB — HEPARIN LEVEL (UNFRACTIONATED)
Heparin Unfractionated: 0.44
Heparin Unfractionated: 0.57

## 2010-11-03 LAB — LIPID PANEL: HDL: 27 — ABNORMAL LOW

## 2010-11-03 LAB — TROPONIN I: Troponin I: <0.01

## 2010-11-03 NOTE — Telephone Encounter (Signed)
Error

## 2010-11-15 NOTE — Discharge Summary (Signed)
NAMETRESA, [name] NO.:  [id number]  MEDICAL RECORD NO.:  [id number]  LOCATION:  [id number]                         FACILITY:  [facility]  PHYSICIAN:  Garlin Batdorf N. Terrence Dupont, M.D. DATE OF BIRTH:  03-13-1950  DATE OF ADMISSION:  10/18/2010 DATE OF DISCHARGE:  10/20/2010                              DISCHARGE SUMMARY   ADMITTING DIAGNOSES:  Unstable angina, rule out myocardial infarction, coronary artery disease, history of percutaneous coronary intervention to right coronary artery and left circumflex in the past, hypertension, non-insulin-dependent diabetes mellitus, hypercholesteremia, history of tobacco abuse, gastroesophageal reflux disease, anemia, history of colonic polyp.  DISCHARGE DIAGNOSES:  Status post chest pain myocardial infarction ruled out, negative Lexiscan Myoview, coronary artery disease stable, history of percutaneous coronary intervention to left circumflex and right coronary artery in the past, hypertension, noninsulin-dependent diabetes mellitus, hypercholesteremia, history of tobacco abuse, gastroesophageal reflux disease, chronic anemia, history of colonic polyp in the past.  DISCHARGE HOME MEDICATIONS: 1. Ferrous sulfate 325 mg 1 tablet twice daily. 2. Maalox Plus Extra Strength suspension 30 mL by mouth every 6 hours     as needed. 3. Metoprolol 12.5 mg twice daily. 4. Amlodipine 5 mg 1 tablet daily. 5. Enteric-coated aspirin 81 mg 1 tablet daily. 6. Avapro 150 mg 1 tablet daily. 7. Crestor 10 mg 1 tablet daily at bedtime. 8. Docusate 100 mg 1 capsule twice daily as needed for constipation. 9. Pepcid 20 mg one p.o. b.i.d. 10.Metformin 500 mg 1 tablet twice daily. 11.Nitrostat 0.4 mg sublingual use as directed. 12.Ventolin inhaler 2 puffs every 4 hours as needed as before.  DIET:  Low salt, low cholesterol 1800 calories ADA diet.  ACTIVITY:  Increase activity slowly as tolerated.  CONDITION AT DISCHARGE:  Stable.  Monitor BP and blood  sugar daily. Follow up with me in 1 week.  HISTORY OF PRESENT ILLNESS:  [name] is a 60 year old black female with past medical history significant for coronary artery disease status post PTCA stenting to left circumflex in 2008, and PTCA stenting to RCA in 2009, hypertension, non-insulin-dependent diabetes mellitus, hypercholesteremia, history of colonic polyp, status post perforation requiring partial colectomy, anemia, and GERD.  She came to the ER complaining of recurrent retrosternal chest pain since yesterday described as pressure grade 4/10 radiating to left shoulder and left arm with nausea and mild shortness of breath.  Denies any palpitation, lightheadedness, or syncope.  Denies PND, orthopnea, leg swelling. States chest pain lasted approximately 15-20 minutes, relieves with sublingual nitro.  Denies relation of chest pain to food, breathing or movement.  Denies cough, fever or chills.  PAST MEDICAL HISTORY:  As above.  PAST SURGICAL HISTORY:  She had tubal ligation in the past, had partial colectomy in the past, had PCI to RCA and left circumflex in the past.  ALLERGIES:  No known drug allergies.  MEDICATION AT HOME:  She was on aspirin, Plavix, Toprol, Norvasc, Avapro, Crestor, Pepcid, Nitrostat, and metformin.  SOCIAL HISTORY:  She is divorced, 4 children.  Smoked half pack per day for 40+ years.  Used to drink beer socially.  Worked in [place] in the past.  FAMILY HISTORY:  Positive for coronary artery disease.  PHYSICAL EXAMINATION:  GENERAL:  She was alert, awake, and oriented x3 in no acute distress. VITAL SIGNS:  Blood pressure was 126/78, pulse was 61 and regular. EYES:  Conjunctiva was pink. NECK:  Supple.  No JVD, no bruit. LUNGS:  Clear to auscultation without rhonchi or rales. CARDIOVASCULAR:  S1 and S2 was normal.  There was no S3 gallop. ABDOMEN:  Soft, bowel sounds present, nontender. EXTREMITIES:  There is no clubbing, cyanosis or  edema.  LABORATORY DATA:  Hemoglobin was 9.3, hematocrit 31.2, white count of 5.6, potassium was 3.4, BUN 10, creatinine 1.01.  Her two sets of cardiac enzymes were negative.  D-dimer were negative.  Pro BNP was normal..  Stool for occult blood was negative.  D-dimers were negative. Her hemoglobin A1c was 6.5.  Anemia panel; her iron was low at 18, iron binding capacity was on the high side 345, percent saturation was 5%. Vitamin B12 level was normal 308, folate level was 15.4.  Stool for occult blood was negative.  BRIEF HOSPITAL COURSE:  The patient was admitted to telemetry unit.  MI was ruled out by serial enzymes and EKG.  The patient did not had any anginal chest pain during the hospital stay.  The patient subsequently underwent Lexiscan Myoview which showed no evidence of reversible ischemia with EF of 61%.  The patient had some vague chest pain, pleuritic chest pain, and GERD symptoms, was started on Maalox.  Due to chronic anemia, her Plavix has been discontinued.  The patient will be followed up with GI as outpatient for further workup for anemia.  The patient will be discharged home on above medications and will be followed up in my office in 1 week.     Allegra Lai. Terrence Dupont, M.D.     MNH/MEDQ  D:  10/20/2010  T:  10/20/2010  Job:  [id number]  Electronically Signed by Charolette Forward M.D. on 11/15/2010 07:31:42 PM

## 2010-11-20 LAB — POCT CARDIAC MARKERS
CKMB, poc: 1.1
CKMB, poc: 1.2
Myoglobin, poc: 78.4
Myoglobin, poc: 80.7
Operator id: 151321
Operator id: 151321
Troponin i, poc: <0.05
Troponin i, poc: <0.05

## 2010-11-20 LAB — LIPID PANEL
Cholesterol: 101
LDL Cholesterol: 45
Triglycerides: 110

## 2010-11-20 LAB — RETICULOCYTES: RBC.: 3.98

## 2010-11-20 LAB — BASIC METABOLIC PANEL
GFR calc Af Amer: >60
GFR calc non Af Amer: 58 — ABNORMAL LOW
Glucose, Bld: 161 — ABNORMAL HIGH
Potassium: 4
Sodium: 138

## 2010-11-20 LAB — CBC
HCT: 33.6 — ABNORMAL LOW
HCT: 36.8
Hemoglobin: 11.4 — ABNORMAL LOW
Hemoglobin: 11.9 — ABNORMAL LOW
MCHC: 32.7
MCHC: 32.8
MCV: 89.1
MCV: 91
Platelets: 257
Platelets: 295
RBC: 3.77 — ABNORMAL LOW
RDW: 14.4 — ABNORMAL HIGH
RDW: 15 — ABNORMAL HIGH
RDW: 15.1 — ABNORMAL HIGH
WBC: 5.9

## 2010-11-20 LAB — I-STAT 8, (EC8 V) (CONVERTED LAB)
Acid-Base Excess: 2
BUN: 7
Bicarbonate: 26.6 — ABNORMAL HIGH
Chloride: 105
Glucose, Bld: 99
HCT: 42
Hemoglobin: 14.3
Operator id: 151321
Potassium: 3.4 — ABNORMAL LOW
Sodium: 141
TCO2: 28
pCO2, Ven: 39.7 — ABNORMAL LOW
pH, Ven: 7.433 — ABNORMAL HIGH

## 2010-11-20 LAB — PROTIME-INR: Prothrombin Time: 12.8

## 2010-11-20 LAB — TROPONIN I: Troponin I: 0.01

## 2010-11-20 LAB — COMPREHENSIVE METABOLIC PANEL
AST: 23
Albumin: 3.4 — ABNORMAL LOW
Alkaline Phosphatase: 107
Chloride: 105
Creatinine, Ser: 0.82
GFR calc Af Amer: >60
Potassium: 3.5
Total Bilirubin: 0.9
Total Protein: 7.3

## 2010-11-20 LAB — DIFFERENTIAL
Basophils Absolute: 0.1
Basophils Relative: 1
Eosinophils Absolute: 0.1
Eosinophils Relative: 2
Lymphocytes Relative: 43

## 2010-11-20 LAB — CK TOTAL AND CKMB (NOT AT ARMC)
CK, MB: 1.2
Relative Index: 1.1

## 2010-11-20 LAB — HEPARIN LEVEL (UNFRACTIONATED)
Heparin Unfractionated: 0.34
Heparin Unfractionated: <0.1 — ABNORMAL LOW

## 2010-11-20 LAB — CARDIAC PANEL(CRET KIN+CKTOT+MB+TROPI)
CK, MB: 1
CK, MB: 1.2
Relative Index: INVALID
Total CK: 79
Total CK: 91

## 2010-11-20 LAB — POCT I-STAT CREATININE
Creatinine, Ser: 0.9
Operator id: 151321

## 2010-11-20 LAB — IRON AND TIBC
Saturation Ratios: 15 — ABNORMAL LOW
UIBC: 274

## 2010-11-30 ENCOUNTER — Other Ambulatory Visit: Payer: Medicaid Other | Admitting: Internal Medicine

## 2010-12-11 ENCOUNTER — Ambulatory Visit (AMBULATORY_SURGERY_CENTER): Payer: Medicaid Other | Admitting: Internal Medicine

## 2010-12-11 ENCOUNTER — Telehealth: Payer: Self-pay | Admitting: *Deleted

## 2010-12-11 ENCOUNTER — Encounter: Payer: Self-pay | Admitting: Internal Medicine

## 2010-12-11 DIAGNOSIS — K8020 Calculus of gallbladder without cholecystitis without obstruction: Secondary | ICD-10-CM

## 2010-12-11 DIAGNOSIS — R109 Unspecified abdominal pain: Secondary | ICD-10-CM

## 2010-12-11 DIAGNOSIS — R079 Chest pain, unspecified: Secondary | ICD-10-CM

## 2010-12-11 DIAGNOSIS — R1013 Epigastric pain: Secondary | ICD-10-CM

## 2010-12-11 LAB — GLUCOSE, CAPILLARY: Glucose-Capillary: 91 mg/dL (ref 70–99)

## 2010-12-11 MED ORDER — FAMOTIDINE 40 MG PO TABS: 40.0000 mg | ORAL_TABLET | Freq: Two times a day (BID) | ORAL | Status: DC

## 2010-12-11 MED ORDER — SODIUM CHLORIDE 0.9 % IV SOLN: 500.0000 mL | INTRAVENOUS | Status: DC

## 2010-12-11 NOTE — Patient Instructions (Signed)
Normal egd. We did a test called a clo test that checks for a bacteria and we will mail you a letter in 1-2 weeks with these results and dr brodie's recommendations  Increase your pepcid to 40 mg two times a day  Dr Nichola Sizer office will schedule a HIDA scan and they will call you to give you the date and time of this procedure.  Discharge instructions per blue and green sheets

## 2010-12-11 NOTE — Progress Notes (Signed)
Patient came into adm with aide.  Patient stated that she had chest pain on and off this weekend and that the nitro did not work.  Stated that she did have shortness of breath.   Pt. Has SOB now, and states chest pain with radiation to the back.  Dr. Olevia Perches examined the patient and okayed her to have her procedure today.

## 2010-12-11 NOTE — Telephone Encounter (Signed)
Per procedure report on 12/11/10- HIDA scan with CCK scheduled. Scheduled at [facility] radiology on 12/27/10 12:45/1:00 PM. NPO 6 hours prior

## 2010-12-12 ENCOUNTER — Telehealth: Payer: Self-pay | Admitting: *Deleted

## 2010-12-12 DIAGNOSIS — R1013 Epigastric pain: Secondary | ICD-10-CM

## 2010-12-12 NOTE — Telephone Encounter (Signed)
Left a message for patient to call me. 

## 2010-12-12 NOTE — Telephone Encounter (Signed)

## 2010-12-12 NOTE — Telephone Encounter (Signed)
Spoke with patient and gave her the appointment date and time and instructions for HIDA scan.

## 2010-12-12 NOTE — Telephone Encounter (Signed)
Spoke with patient and gave her the appointment time and date. She is not at home and will call back so she can write it all down.

## 2010-12-13 ENCOUNTER — Telehealth: Payer: Self-pay | Admitting: *Deleted

## 2010-12-13 NOTE — Telephone Encounter (Signed)
Spoke with patient and gave her the results as per Dr. Brodie. 

## 2010-12-13 NOTE — Telephone Encounter (Signed)
Message copied by Hulan Saas on Wed Dec 13, 2010  8:59 AM ------      Message from: [place] Dragon      Created: Tue Dec 12, 2010  8:23 PM       Please call pt with negative H.pylori results

## 2010-12-27 ENCOUNTER — Ambulatory Visit (HOSPITAL_COMMUNITY)
Admission: RE | Admit: 2010-12-27 | Discharge: 2010-12-27 | Disposition: A | Discharge status: Home / Self Care | Payer: Medicaid Other | Source: Ambulatory Visit | Attending: Internal Medicine | Admitting: Internal Medicine

## 2010-12-27 ENCOUNTER — Encounter (HOSPITAL_COMMUNITY): Payer: Self-pay

## 2010-12-27 DIAGNOSIS — R1011 Right upper quadrant pain: Secondary | ICD-10-CM | POA: Insufficient documentation

## 2010-12-27 DIAGNOSIS — R110 Nausea: Secondary | ICD-10-CM | POA: Insufficient documentation

## 2010-12-27 MED ORDER — SINCALIDE 5 MCG IJ SOLR: 0.0200 ug/kg | Freq: Once | INTRAMUSCULAR | Status: DC

## 2010-12-27 MED ORDER — TECHNETIUM TC 99M MEBROFENIN IV KIT
5.5000 | PACK | Freq: Once | INTRAVENOUS | Status: AC | PRN
  Administered 2010-12-27: 5.5 via INTRAVENOUS

## 2011-01-01 NOTE — Progress Notes (Signed)
Patient advised of normal HIDA scan. Patient states that she is still having problems. I have advised her to increase pepcid to twice daily (as per Amy Esterwoods last office note) as patient states that she has only been taking the pill once daily. Patient verbalizes understanding.

## 2011-01-24 ENCOUNTER — Other Ambulatory Visit: Payer: Self-pay | Admitting: Physician Assistant

## 2011-02-18 ENCOUNTER — Encounter (HOSPITAL_COMMUNITY): Payer: Self-pay | Admitting: *Deleted

## 2011-02-18 ENCOUNTER — Emergency Department (HOSPITAL_COMMUNITY): Payer: Medicaid Other

## 2011-02-18 ENCOUNTER — Observation Stay (HOSPITAL_COMMUNITY)
Admission: EM | Admit: 2011-02-18 | Discharge: 2011-02-21 | Disposition: A | Discharge status: Home / Self Care | Payer: Medicaid Other | Attending: Cardiology | Admitting: Cardiology

## 2011-02-18 ENCOUNTER — Other Ambulatory Visit: Payer: Self-pay

## 2011-02-18 DIAGNOSIS — K219 Gastro-esophageal reflux disease without esophagitis: Secondary | ICD-10-CM | POA: Insufficient documentation

## 2011-02-18 DIAGNOSIS — I2510 Atherosclerotic heart disease of native coronary artery without angina pectoris: Secondary | ICD-10-CM | POA: Insufficient documentation

## 2011-02-18 DIAGNOSIS — E785 Hyperlipidemia, unspecified: Secondary | ICD-10-CM | POA: Insufficient documentation

## 2011-02-18 DIAGNOSIS — I10 Essential (primary) hypertension: Secondary | ICD-10-CM | POA: Insufficient documentation

## 2011-02-18 DIAGNOSIS — I701 Atherosclerosis of renal artery: Secondary | ICD-10-CM | POA: Insufficient documentation

## 2011-02-18 DIAGNOSIS — I252 Old myocardial infarction: Secondary | ICD-10-CM | POA: Insufficient documentation

## 2011-02-18 DIAGNOSIS — Z9861 Coronary angioplasty status: Secondary | ICD-10-CM | POA: Insufficient documentation

## 2011-02-18 DIAGNOSIS — R0602 Shortness of breath: Secondary | ICD-10-CM | POA: Insufficient documentation

## 2011-02-18 DIAGNOSIS — R079 Chest pain, unspecified: Principal | ICD-10-CM | POA: Diagnosis present

## 2011-02-18 HISTORY — DX: Angina pectoris, unspecified: I20.9

## 2011-02-18 LAB — COMPREHENSIVE METABOLIC PANEL
AST: 12 U/L (ref 0–37)
Albumin: 3.4 g/dL — ABNORMAL LOW (ref 3.5–5.2)
Alkaline Phosphatase: 122 U/L — ABNORMAL HIGH (ref 39–117)
BUN: 11 mg/dL (ref 6–23)
Chloride: 105 mEq/L (ref 96–112)
Potassium: 3.6 mEq/L (ref 3.5–5.1)
Total Bilirubin: 0.5 mg/dL (ref 0.3–1.2)

## 2011-02-18 LAB — PROTIME-INR: INR: 0.91 (ref 0.00–1.49)

## 2011-02-18 LAB — CBC
MCH: 25.2 pg — ABNORMAL LOW (ref 26.0–34.0)
MCV: 84 fL (ref 78.0–100.0)
Platelets: 367 10*3/uL (ref 150–400)
RDW: 16.4 % — ABNORMAL HIGH (ref 11.5–15.5)
WBC: 7.1 10*3/uL (ref 4.0–10.5)

## 2011-02-18 MED ORDER — METOPROLOL TARTRATE 25 MG PO TABS
25.0000 mg | ORAL_TABLET | Freq: Two times a day (BID) | ORAL | Status: DC
  Administered 2011-02-18 – 2011-02-19 (×3): 25 mg via ORAL
  Filled 2011-02-18 (×5): qty 1

## 2011-02-18 MED ORDER — OLMESARTAN 10 MG HALF TABLET
10.0000 mg | ORAL_TABLET | Freq: Every day | ORAL | Status: DC
  Administered 2011-02-18 – 2011-02-21 (×3): 10 mg via ORAL
  Filled 2011-02-18 (×4): qty 1

## 2011-02-18 MED ORDER — ASPIRIN 300 MG RE SUPP
300.0000 mg | RECTAL | Status: AC
  Administered 2011-02-18: 300 mg via RECTAL
  Filled 2011-02-18: qty 1

## 2011-02-18 MED ORDER — SODIUM CHLORIDE 0.9 % IV SOLN: 250.0000 mL | INTRAVENOUS | Status: DC | PRN

## 2011-02-18 MED ORDER — HYDROCODONE-ACETAMINOPHEN 5-325 MG PO TABS: 2.0000 | ORAL_TABLET | Freq: Four times a day (QID) | ORAL | Status: DC | PRN

## 2011-02-18 MED ORDER — HEPARIN BOLUS VIA INFUSION
3500.0000 [IU] | Freq: Once | INTRAVENOUS | Status: AC
  Administered 2011-02-18: 3500 [IU] via INTRAVENOUS
  Filled 2011-02-18: qty 3500

## 2011-02-18 MED ORDER — ALBUTEROL SULFATE (5 MG/ML) 0.5% IN NEBU
2.5000 mg | INHALATION_SOLUTION | Freq: Four times a day (QID) | RESPIRATORY_TRACT | Status: AC
  Administered 2011-02-18 – 2011-02-19 (×4): 2.5 mg via RESPIRATORY_TRACT
  Filled 2011-02-18 (×4): qty 0.5

## 2011-02-18 MED ORDER — SODIUM CHLORIDE 0.9 % IJ SOLN
3.0000 mL | Freq: Two times a day (BID) | INTRAMUSCULAR | Status: DC
  Administered 2011-02-19 – 2011-02-21 (×2): 3 mL via INTRAVENOUS

## 2011-02-18 MED ORDER — NITROGLYCERIN 0.4 MG SL SUBL
0.4000 mg | SUBLINGUAL_TABLET | SUBLINGUAL | Status: DC | PRN
  Filled 2011-02-18: qty 75

## 2011-02-18 MED ORDER — CLOPIDOGREL BISULFATE 75 MG PO TABS
75.0000 mg | ORAL_TABLET | Freq: Once | ORAL | Status: AC
  Administered 2011-02-18: 75 mg via ORAL
  Filled 2011-02-18: qty 1

## 2011-02-18 MED ORDER — HYDROCODONE-ACETAMINOPHEN 10-325 MG PO TABS: 1.0000 | ORAL_TABLET | Freq: Once | ORAL | Status: DC

## 2011-02-18 MED ORDER — HYDROCODONE-ACETAMINOPHEN 5-325 MG PO TABS
2.0000 | ORAL_TABLET | Freq: Once | ORAL | Status: AC
  Administered 2011-02-18: 2 via ORAL

## 2011-02-18 MED ORDER — ASPIRIN 81 MG PO CHEW: 324.0000 mg | CHEWABLE_TABLET | ORAL | Status: AC

## 2011-02-18 MED ORDER — ASPIRIN EC 81 MG PO TBEC
81.0000 mg | DELAYED_RELEASE_TABLET | Freq: Every day | ORAL | Status: DC
  Administered 2011-02-19: 81 mg via ORAL
  Filled 2011-02-18 (×2): qty 1

## 2011-02-18 MED ORDER — HEPARIN SOD (PORCINE) IN D5W 100 UNIT/ML IV SOLN
1050.0000 [IU]/h | INTRAVENOUS | Status: DC
  Administered 2011-02-18: 750 [IU]/h via INTRAVENOUS
  Administered 2011-02-19: 1050 [IU]/h via INTRAVENOUS
  Filled 2011-02-18 (×3): qty 250

## 2011-02-18 MED ORDER — ACETAMINOPHEN 325 MG PO TABS
650.0000 mg | ORAL_TABLET | ORAL | Status: DC | PRN
  Administered 2011-02-20: 650 mg via ORAL
  Filled 2011-02-18: qty 2

## 2011-02-18 MED ORDER — ROSUVASTATIN CALCIUM 20 MG PO TABS
20.0000 mg | ORAL_TABLET | Freq: Every day | ORAL | Status: DC
  Administered 2011-02-18 – 2011-02-21 (×3): 20 mg via ORAL
  Filled 2011-02-18 (×4): qty 1

## 2011-02-18 MED ORDER — INSULIN ASPART 100 UNIT/ML ~~LOC~~ SOLN
0.0000 [IU] | Freq: Three times a day (TID) | SUBCUTANEOUS | Status: DC
  Administered 2011-02-19: 8 [IU] via SUBCUTANEOUS
  Administered 2011-02-19: 3 [IU] via SUBCUTANEOUS
  Administered 2011-02-20: 2 [IU] via SUBCUTANEOUS
  Administered 2011-02-21: 5 [IU] via SUBCUTANEOUS
  Filled 2011-02-18: qty 3

## 2011-02-18 MED ORDER — AMLODIPINE BESYLATE 5 MG PO TABS
5.0000 mg | ORAL_TABLET | Freq: Every day | ORAL | Status: DC
  Administered 2011-02-18 – 2011-02-21 (×3): 5 mg via ORAL
  Filled 2011-02-18 (×4): qty 1

## 2011-02-18 MED ORDER — ALPRAZOLAM 0.25 MG PO TABS: 0.2500 mg | ORAL_TABLET | Freq: Two times a day (BID) | ORAL | Status: DC | PRN

## 2011-02-18 MED ORDER — ONDANSETRON HCL 4 MG/2ML IJ SOLN: 4.0000 mg | Freq: Four times a day (QID) | INTRAMUSCULAR | Status: DC | PRN

## 2011-02-18 MED ORDER — METFORMIN HCL 500 MG PO TABS
500.0000 mg | ORAL_TABLET | Freq: Two times a day (BID) | ORAL | Status: DC
  Administered 2011-02-18: 500 mg via ORAL
  Filled 2011-02-18 (×6): qty 1

## 2011-02-18 MED ORDER — PANTOPRAZOLE SODIUM 40 MG IV SOLR
40.0000 mg | Freq: Once | INTRAVENOUS | Status: AC
  Administered 2011-02-18: 40 mg via INTRAVENOUS
  Filled 2011-02-18: qty 40

## 2011-02-18 MED ORDER — SODIUM CHLORIDE 0.9 % IV SOLN
INTRAVENOUS | Status: DC
  Administered 2011-02-18: 20 mL/h via INTRAVENOUS

## 2011-02-18 MED ORDER — NITROGLYCERIN 0.4 MG SL SUBL
0.4000 mg | SUBLINGUAL_TABLET | Freq: Once | SUBLINGUAL | Status: AC
  Administered 2011-02-18: 0.4 mg via SUBLINGUAL
  Filled 2011-02-18: qty 25

## 2011-02-18 MED ORDER — CLOPIDOGREL BISULFATE 75 MG PO TABS
75.0000 mg | ORAL_TABLET | Freq: Every day | ORAL | Status: DC
  Administered 2011-02-19: 75 mg via ORAL
  Filled 2011-02-18: qty 1

## 2011-02-18 MED ORDER — ONDANSETRON HCL 4 MG/2ML IJ SOLN
4.0000 mg | Freq: Once | INTRAMUSCULAR | Status: AC
  Administered 2011-02-18: 4 mg via INTRAVENOUS
  Filled 2011-02-18: qty 2

## 2011-02-18 MED ORDER — SODIUM CHLORIDE 0.9 % IJ SOLN: 3.0000 mL | INTRAMUSCULAR | Status: DC | PRN

## 2011-02-18 MED ORDER — MORPHINE SULFATE 4 MG/ML IJ SOLN
4.0000 mg | Freq: Once | INTRAMUSCULAR | Status: AC
  Administered 2011-02-18: 4 mg via INTRAVENOUS
  Filled 2011-02-18: qty 1

## 2011-02-18 MED ORDER — HYDROCODONE-ACETAMINOPHEN 5-325 MG PO TABS
ORAL_TABLET | ORAL | Status: AC
  Filled 2011-02-18: qty 2

## 2011-02-18 MED ORDER — LORAZEPAM 1 MG PO TABS
1.0000 mg | ORAL_TABLET | Freq: Once | ORAL | Status: AC
  Administered 2011-02-18: 1 mg via ORAL
  Filled 2011-02-18: qty 1

## 2011-02-18 NOTE — ED Notes (Signed)
Pt states 4/10 chest pain after receiving medications. Remains on cardiac monitor. Vital signs stable. No signs of distress at the time.

## 2011-02-18 NOTE — ED Notes (Signed)
Reports onset last night of sob and mid chest pain that radiates to left side of chest. ekg being done at triage.

## 2011-02-18 NOTE — ED Notes (Addendum)
Pt resting quietly at the time. Remains on cardiac monitor. States 2/10 chest pain. Admitting at bedside to consult. Will continue to monitor.

## 2011-02-18 NOTE — ED Provider Notes (Signed)
History     CSN: [id number]  Arrival date & time 02/18/11  1103   First MD Initiated Contact with Patient 02/18/11 1119      Chief Complaint  Patient presents with  . Chest Pain  . Shortness of Breath    (Consider location/radiation/quality/duration/timing/severity/associated sxs/prior treatment) Patient is a 61 y.o. female presenting with chest pain and shortness of breath. The history is provided by the patient.  Chest Pain Primary symptoms include shortness of breath. Pertinent negatives for primary symptoms include no fever and no abdominal pain.    Shortness of Breath  Associated symptoms include chest pain and shortness of breath. Pertinent negatives include no fever.  pt notes onset mid chestpain yesterday, at rest. Constant. Dull, non radiating. No neck, jaw, or back pain. Not pleuritic. Mild sob. Non productive cough. No sore throat, runny nose, fever, or other uri c/o. No leg pain or swelling. No dvt or pe hx. +hx cad/stents. Pt is unsure cause of pain, unsure if similar to prior cardiac cp. No relief w ntg at home. No nv, or diaphoresis. Denies abd pain. No heartburn.   Past Medical History  Diagnosis Date  . History of colonic polyps   . GERD (gastroesophageal reflux disease)   . Hyperlipidemia   . Diabetes mellitus without mention of complication   . Hypertension   . Myocardial infarction   . Coronary artery disease   . Fatty liver disease, nonalcoholic   . Colon polyp   . Anemia     Past Surgical History  Procedure Date  . Right colectomy     After colonic perforation  . Tubal ligation   . Coronary angioplasty with stent placement     Family History  Problem Relation Age of Onset  . Heart disease Mother   . Diabetes Mother     Sister also  . Colon cancer Neg Hx     History  Substance Use Topics  . Smoking status: Current Everyday Smoker -- 0.1 packs/day for 40 years  . Smokeless tobacco: Never Used  . Alcohol Use: No    OB History    Grav  Para Term Preterm Abortions TAB SAB Ect Mult Living                  Review of Systems  Constitutional: Negative for fever and chills.  HENT: Negative for neck pain.   Eyes: Negative for redness.  Respiratory: Positive for shortness of breath.   Cardiovascular: Positive for chest pain.  Gastrointestinal: Negative for abdominal pain.  Genitourinary: Negative for flank pain.  Musculoskeletal: Negative for back pain.  Skin: Negative for rash.  Neurological: Negative for headaches.  Hematological: Does not bruise/bleed easily.  Psychiatric/Behavioral: Negative for confusion.    Allergies  Review of patient's allergies indicates no known allergies.  Home Medications   Current Outpatient Rx  Name Route Sig Dispense Refill  . AMLODIPINE BESYLATE 5 MG PO TABS Oral Take 5 mg by mouth daily.      . ASPIRIN 81 MG PO TABS Oral Take 81 mg by mouth daily.      Marland Kitchen CLOPIDOGREL BISULFATE 75 MG PO TABS Oral Take 75 mg by mouth daily.      Marland Kitchen FAMOTIDINE 20 MG PO TABS Oral Take 20 mg by mouth at bedtime as needed.      Marland Kitchen FAMOTIDINE 40 MG PO TABS Oral Take 1 tablet (40 mg total) by mouth 2 (two) times daily. 60 tablet 10  . IRBESARTAN 150 MG PO  TABS Oral Take 150 mg by mouth daily.      Marland Kitchen METFORMIN HCL PO Oral Take 1 mg by mouth daily. Take 1 daily, 500 mg tablet     . METOPROLOL TARTRATE 25 MG PO TABS Oral Take 25 mg by mouth 2 (two) times daily. Take 1 tab daily     . NITROGLYCERIN 0.4 MG SL SUBL Sublingual Place 0.4 mg under the tongue every 5 (five) minutes as needed. As needed     . ONDANSETRON HCL 4 MG PO TABS  Take 1 tablet by mouth every six hours as needed for nausea 25 tablet 0  . ROSUVASTATIN CALCIUM 10 MG PO TABS Oral Take 10 mg by mouth daily.      . TRAMADOL HCL 50 MG PO TABS Oral Take 50 mg by mouth every 8 (eight) hours as needed. For pain      BP 172/75  Pulse 75  Temp(Src) 97.8 F (36.6 C) (Oral)  Resp 24  Wt 141 lb (63.957 kg)  SpO2 100%  Physical Exam  Nursing note and  vitals reviewed. Constitutional: She appears well-developed and well-nourished. No distress.  HENT:  Head: Atraumatic.  Eyes: Conjunctivae are normal. No scleral icterus.  Neck: Neck supple. No tracheal deviation present.  Cardiovascular: Normal rate, regular rhythm, normal heart sounds and intact distal pulses.  Exam reveals no gallop and no friction rub.   No murmur heard. Pulmonary/Chest: Effort normal and breath sounds normal. No respiratory distress. She exhibits no tenderness.  Abdominal: Soft. Normal appearance. She exhibits no distension and no mass. There is no tenderness. There is no rebound and no guarding.  Musculoskeletal: She exhibits no edema.  Neurological: She is alert.  Skin: Skin is warm and dry. No rash noted.  Psychiatric: She has a normal mood and affect.    ED Course  Procedures (including critical care time)   Labs Reviewed  COMPREHENSIVE METABOLIC PANEL  CBC  LIPASE, BLOOD  CARDIAC PANEL(CRET KIN+CKTOT+MB+TROPI)  PROTIME-INR    Results for orders placed during the hospital encounter of 02/18/11  COMPREHENSIVE METABOLIC PANEL      Component Value Range   Sodium 142  135 - 145 (mEq/L)   Potassium 3.6  3.5 - 5.1 (mEq/L)   Chloride 105  96 - 112 (mEq/L)   CO2 28  19 - 32 (mEq/L)   Glucose, Bld 168 (*) 70 - 99 (mg/dL)   BUN 11  6 - 23 (mg/dL)   Creatinine, Ser 0.96  0.50 - 1.10 (mg/dL)   Calcium 9.9  8.4 - 10.5 (mg/dL)   Total Protein 8.6 (*) 6.0 - 8.3 (g/dL)   Albumin 3.4 (*) 3.5 - 5.2 (g/dL)   AST 12  0 - 37 (U/L)   ALT 13  0 - 35 (U/L)   Alkaline Phosphatase 122 (*) 39 - 117 (U/L)   Total Bilirubin 0.5  0.3 - 1.2 (mg/dL)   GFR calc non Af Amer 62 (*) >90 (mL/min)   GFR calc Af Amer 72 (*) >90 (mL/min)  CBC      Component Value Range   WBC 7.1  4.0 - 10.5 (K/uL)   RBC 3.69 (*) 3.87 - 5.11 (MIL/uL)   Hemoglobin 9.3 (*) 12.0 - 15.0 (g/dL)   HCT 31.0 (*) 36.0 - 46.0 (%)   MCV 84.0  78.0 - 100.0 (fL)   MCH 25.2 (*) 26.0 - 34.0 (pg)   MCHC 30.0   30.0 - 36.0 (g/dL)   RDW 16.4 (*) 11.5 - 15.5 (%)  Platelets 367  150 - 400 (K/uL)  LIPASE, BLOOD      Component Value Range   Lipase 44  11 - 59 (U/L)  CARDIAC PANEL(CRET KIN+CKTOT+MB+TROPI)      Component Value Range   Total CK 86  7 - 177 (U/L)   CK, MB 1.9  0.3 - 4.0 (ng/mL)   Troponin I <0.30  <0.30 (ng/mL)   Relative Index RELATIVE INDEX IS INVALID  0.0 - 2.5   PROTIME-INR      Component Value Range   Prothrombin Time 12.5  11.6 - 15.2 (seconds)   INR 0.91  0.00 - 1.49    Dg Chest Port 1 View  02/18/2011  *RADIOLOGY REPORT*  Clinical Data: Chest pain and cough  PORTABLE CHEST - 1 VIEW  Comparison: 10/18/2010  Findings: Mild cardiomegaly.  Clear lungs.  No pneumothorax.  No pleural effusion.  No sign of interstitial edema.  IMPRESSION: Cardiomegaly without decompensation.  Original Report Authenticated By: Jamas Lav, M.D.      MDM  Iv ns. Ecg. Cxr. Labs. ntg sl. Morphine iv. zofran iv. Hx gerd, protonix iv.   Reviewed nursing notes, meds.   Reviewed prior charts, summaries.  Hx cad, prior stents 08, 09, has had admissions for cp since that proved neg for acute cardiac issue.     Date: 02/18/2011  Rate: 80  Rhythm: normal sinus rhythm  QRS Axis: normal  Intervals: normal  ST/T Wave abnormalities: normal  Conduction Disutrbances:none  Narrative Interpretation:   Old EKG Reviewed: unchanged  Recheck no cp. Initial cm normal. Discussed w on call md for pts cardiologist  - Dr Doylene Canard will see in ed, admit.        Mirna Mires, MD 02/18/11 1323

## 2011-02-18 NOTE — H&P (Signed)
[name] is an 61 y.o. female.   Chief Complaint: Chest pain  HPI: 61 years old black female with sharp retrosternal chest pain, non-radiating, increased by palpation. H/O coronary artery disease with stent placements in past. No fever.   Past Medical History  Diagnosis Date  . History of colonic polyps   . GERD (gastroesophageal reflux disease)   . Hyperlipidemia   . Diabetes mellitus without mention of complication   . Hypertension   . Myocardial infarction   . Coronary artery disease   . Fatty liver disease, nonalcoholic   . Colon polyp   . Anemia       Past Surgical History  Procedure Date  . Right colectomy     After colonic perforation  . Tubal ligation   . Coronary angioplasty with stent placement     Family History  Problem Relation Age of Onset  . Heart disease Mother   . Diabetes Mother     Sister also  . Colon cancer Neg Hx    Social History:  reports that she has been smoking.  She has never used smokeless tobacco. She reports that she does not drink alcohol or use illicit drugs.  Allergies: No Known Allergies  Medications Prior to Admission  Medication Dose Route Frequency Provider Last Rate Last Dose  . 0.9 %  sodium chloride infusion   Intravenous Continuous Mirna Mires, MD 20 mL/hr at 02/18/11 1200 20 mL/hr at 02/18/11 1200  . HYDROcodone-acetaminophen (NORCO) 10-325 MG per tablet 1 tablet  1 tablet Oral Once Birdie Riddle, MD      . morphine 4 MG/ML injection 4 mg  4 mg Intravenous Once Mirna Mires, MD   4 mg at 02/18/11 1202  . nitroGLYCERIN (NITROSTAT) SL tablet 0.4 mg  0.4 mg Sublingual Once Mirna Mires, MD   0.4 mg at 02/18/11 1204  . ondansetron (ZOFRAN) injection 4 mg  4 mg Intravenous Once Mirna Mires, MD   4 mg at 02/18/11 1208  . pantoprazole (PROTONIX) injection 40 mg  40 mg Intravenous Once Mirna Mires, MD   40 mg at 02/18/11 1205   Medications Prior to Admission  Medication Sig Dispense Refill  . amLODipine (NORVASC)  5 MG tablet Take 5 mg by mouth daily.        Marland Kitchen aspirin 81 MG tablet Take 81 mg by mouth daily.        . clopidogrel (PLAVIX) 75 MG tablet Take 75 mg by mouth daily.        . famotidine (PEPCID) 20 MG tablet Take 20 mg by mouth at bedtime as needed.        . famotidine (PEPCID) 40 MG tablet Take 1 tablet (40 mg total) by mouth 2 (two) times daily.  60 tablet  10  . irbesartan (AVAPRO) 150 MG tablet Take 150 mg by mouth daily.        Marland Kitchen METFORMIN HCL PO Take 1 mg by mouth daily. Take 1 daily, 500 mg tablet       . metoprolol tartrate (LOPRESSOR) 25 MG tablet Take 25 mg by mouth 2 (two) times daily. Take 1 tab daily       . nitroGLYCERIN (NITROSTAT) 0.4 MG SL tablet Place 0.4 mg under the tongue every 5 (five) minutes as needed. As needed       . ondansetron (ZOFRAN) 4 MG tablet Take 1 tablet by mouth every six hours as needed for nausea  25 tablet  0  . rosuvastatin (CRESTOR) 10 MG tablet Take 10 mg by mouth daily.        . traMADol (ULTRAM) 50 MG tablet Take 50 mg by mouth every 8 (eight) hours as needed. For pain        Results for orders placed during the hospital encounter of 02/18/11 (from the past 48 hour(s))  COMPREHENSIVE METABOLIC PANEL     Status: Abnormal   Collection Time   02/18/11 11:30 AM      Component Value Range Comment   Sodium 142  135 - 145 (mEq/L)    Potassium 3.6  3.5 - 5.1 (mEq/L)    Chloride 105  96 - 112 (mEq/L)    CO2 28  19 - 32 (mEq/L)    Glucose, Bld 168 (*) 70 - 99 (mg/dL)    BUN 11  6 - 23 (mg/dL)    Creatinine, Ser 0.96  0.50 - 1.10 (mg/dL)    Calcium 9.9  8.4 - 10.5 (mg/dL)    Total Protein 8.6 (*) 6.0 - 8.3 (g/dL)    Albumin 3.4 (*) 3.5 - 5.2 (g/dL)    AST 12  0 - 37 (U/L)    ALT 13  0 - 35 (U/L)    Alkaline Phosphatase 122 (*) 39 - 117 (U/L)    Total Bilirubin 0.5  0.3 - 1.2 (mg/dL)    GFR calc non Af Amer 62 (*) >90 (mL/min)    GFR calc Af Amer 72 (*) >90 (mL/min)   CBC     Status: Abnormal   Collection Time   02/18/11 11:30 AM      Component  Value Range Comment   WBC 7.1  4.0 - 10.5 (K/uL)    RBC 3.69 (*) 3.87 - 5.11 (MIL/uL)    Hemoglobin 9.3 (*) 12.0 - 15.0 (g/dL)    HCT 31.0 (*) 36.0 - 46.0 (%)    MCV 84.0  78.0 - 100.0 (fL)    MCH 25.2 (*) 26.0 - 34.0 (pg)    MCHC 30.0  30.0 - 36.0 (g/dL)    RDW 16.4 (*) 11.5 - 15.5 (%)    Platelets 367  150 - 400 (K/uL)   LIPASE, BLOOD     Status: Normal   Collection Time   02/18/11 11:30 AM      Component Value Range Comment   Lipase 44  11 - 59 (U/L)   PROTIME-INR     Status: Normal   Collection Time   02/18/11 11:30 AM      Component Value Range Comment   Prothrombin Time 12.5  11.6 - 15.2 (seconds)    INR 0.91  0.00 - 1.49    CARDIAC PANEL(CRET KIN+CKTOT+MB+TROPI)     Status: Normal   Collection Time   02/18/11 11:35 AM      Component Value Range Comment   Total CK 86  7 - 177 (U/L)    CK, MB 1.9  0.3 - 4.0 (ng/mL)    Troponin I <0.30  <0.30 (ng/mL)    Relative Index RELATIVE INDEX IS INVALID  0.0 - 2.5     Dg Chest Port 1 View  02/18/2011  *RADIOLOGY REPORT*  Clinical Data: Chest pain and cough  PORTABLE CHEST - 1 VIEW  Comparison: 10/18/2010  Findings: Mild cardiomegaly.  Clear lungs.  No pneumothorax.  No pleural effusion.  No sign of interstitial edema.  IMPRESSION: Cardiomegaly without decompensation.  Original Report Authenticated By: Jamas Lav, M.D.    @ROS @  Blood  pressure 153/63, pulse 75, temperature 97.8 F (36.6 C), temperature source Oral, resp. rate 22, weight 63.957 kg (141 lb), SpO2 100.00%. General appearance: alert and cooperative Head: Normocephalic, without obvious abnormality, atraumatic Eyes: Brown, conjunctivae/corneas clear. PERRL, EOM's intact. Throat: lips, mucosa, and tongue normal; teeth and gums normal Neck: no adenopathy, no carotid bruit, no JVD, supple, symmetrical, trachea midline and thyroid not enlarged, symmetric, no tenderness/mass/nodules Resp: clear to auscultation bilaterally Chest wall: retrosternal tenderness on palpation    Cardio: regular rate and rhythm, S1, S2 normal, no murmur, click, rub or gallop GI: soft, non-tender; bowel sounds normal; no masses,  no organomegaly Extremities: extremities normal, atraumatic, no cyanosis or edema Skin: Skin color, texture, turgor normal. No rashes or lesions Neurologic: Alert and oriented X 3, normal strength and tone. Normal symmetric reflexes. Normal coordination and gait  Assessment/Plan Patient Active Problem List  Diagnoses  . DM  . HYPERLIPIDEMIA  . HYPERTENSION  . MYOCARDIAL INFARCTION  . CORONARY ARTERY DISEASE  . GERD  . FATTY LIVER DISEASE  . COLONIC POLYPS, HX OF  . Cholelithiasis  . Chest pain at rest, principal    Gordon Vandunk S 02/18/2011, 2:04 PM

## 2011-02-18 NOTE — Progress Notes (Signed)
Called by bedside RN to evaluate new admission for c/o of Chest pain.  EKG done. Upon arrival to patients room, RN at bedside.  Patient on 2LPM via nasal cannula, patient c/o of chest pain 5/10 and SOB.  Patient speaking in short fragmented sentences with use of accessory muscles.  SL nitro given x 3 by bedside RN, chest pain now 3/10.  Vital signs 138/80, HR 64, SPO2 99%on 2 LPM breath sounds essentially clear.  Spoke to Dr. Doylene Canard, orders given.  Patient given 1mg  PO ativan by RN and given albuterol breathing treatment by RT.  Vital signs 1600  HR 67, BP 143/62 SPO2 97% on 2 LPM [place].  Patient breathing easier and denies chest pain at this time.  Pt states she feels much better.  Will continue to follow and monitor

## 2011-02-18 NOTE — ED Notes (Signed)
Pt presents to department for evaluation of midsternal chest pain radiating to L shoulder. Started yesterday while at home resting. 6/10 pain upon arrival to ED. Also states SOB and decreased appetite. Respirations labored. Lung sounds clear and equal bilaterally. Skin warm and dry. Pt states pain becomes worse with movement. Pt conscious alert and oriented x4. No signs of distress at the time.

## 2011-02-18 NOTE — Progress Notes (Signed)
ANTICOAGULATION CONSULT NOTE - Initial Consult  Pharmacy Consult for Heparin Indication: chest pain/ACS  Assessment: 61 yo F with a hx CAD to begin heparin for sharp retrosternal chest pain and SOB.  Goal of Therapy:  Heparin level 0.3-0.7 units/ml   Plan:  1. Heparin 3500 units IV bolus then 750 units/hr (7.5 ml/hr) 2. Heparin level 6 hrs after started 3. Daily heparin level and CBC 4. Will monitor for s/sx of bleeding  No Known Allergies  Patient Measurements: Height: 4\' 11"  (149.9 cm) Weight: 144 lb 10 oz (65.6 kg) IBW/kg (Calculated) : 43.2  Heparin Dosing Weight: 59.5 kg  Vital Signs: Temp: 98.2 F (36.8 C) (01/13 1456) Temp src: Oral (01/13 1456) BP: 169/82 mmHg (01/13 1456) Pulse Rate: 64  (01/13 1456)  Labs:  Basename 02/18/11 1135 02/18/11 1130  HGB -- 9.3*  HCT -- 31.0*  PLT -- 367  APTT -- --  LABPROT -- 12.5  INR -- 0.91  HEPARINUNFRC -- --  CREATININE -- 0.96  CKTOTAL 86 --  CKMB 1.9 --  TROPONINI <0.30 --   Estimated Creatinine Clearance: 50.1 ml/min (by C-G formula based on Cr of 0.96).  Medical History: Past Medical History  Diagnosis Date  . History of colonic polyps   . GERD (gastroesophageal reflux disease)   . Hyperlipidemia   . Diabetes mellitus without mention of complication   . Hypertension   . Myocardial infarction   . Coronary artery disease   . Fatty liver disease, nonalcoholic   . Colon polyp   . Anemia     Medications:  Prescriptions prior to admission  Medication Sig Dispense Refill  . amLODipine (NORVASC) 5 MG tablet Take 5 mg by mouth daily.        Marland Kitchen aspirin 81 MG tablet Take 81 mg by mouth daily.        . clopidogrel (PLAVIX) 75 MG tablet Take 75 mg by mouth daily.        . famotidine (PEPCID) 20 MG tablet Take 20 mg by mouth at bedtime as needed.        . famotidine (PEPCID) 40 MG tablet Take 1 tablet (40 mg total) by mouth 2 (two) times daily.  60 tablet  10  . irbesartan (AVAPRO) 150 MG tablet Take 150 mg by  mouth daily.        Marland Kitchen METFORMIN HCL PO Take 1 mg by mouth daily. Take 1 daily, 500 mg tablet       . metoprolol tartrate (LOPRESSOR) 25 MG tablet Take 25 mg by mouth 2 (two) times daily. Take 1 tab daily       . nitroGLYCERIN (NITROSTAT) 0.4 MG SL tablet Place 0.4 mg under the tongue every 5 (five) minutes as needed. As needed       . ondansetron (ZOFRAN) 4 MG tablet Take 1 tablet by mouth every six hours as needed for nausea  25 tablet  0  . rosuvastatin (CRESTOR) 10 MG tablet Take 10 mg by mouth daily.        . traMADol (ULTRAM) 50 MG tablet Take 50 mg by mouth every 8 (eight) hours as needed. For pain        [name] 02/18/2011,5:00 PM

## 2011-02-19 ENCOUNTER — Other Ambulatory Visit: Payer: Self-pay

## 2011-02-19 ENCOUNTER — Encounter (HOSPITAL_COMMUNITY): Payer: Self-pay | Admitting: General Practice

## 2011-02-19 LAB — BASIC METABOLIC PANEL
Calcium: 9.1 mg/dL (ref 8.4–10.5)
Creatinine, Ser: 0.91 mg/dL (ref 0.50–1.10)
GFR calc Af Amer: 77 mL/min — ABNORMAL LOW (ref >90)
GFR calc non Af Amer: 66 mL/min — ABNORMAL LOW (ref >90)
Sodium: 139 mEq/L (ref 135–145)

## 2011-02-19 LAB — PROTIME-INR
INR: 1.02 (ref 0.00–1.49)
Prothrombin Time: 13.6 seconds (ref 11.6–15.2)

## 2011-02-19 LAB — PREPARE RBC (CROSSMATCH)

## 2011-02-19 LAB — CARDIAC PANEL(CRET KIN+CKTOT+MB+TROPI)
Relative Index: INVALID (ref 0.0–2.5)
Troponin I: <0.3 ng/mL (ref <0.30)

## 2011-02-19 LAB — GLUCOSE, CAPILLARY
Glucose-Capillary: 178 mg/dL — ABNORMAL HIGH (ref 70–99)
Glucose-Capillary: 122 mg/dL — ABNORMAL HIGH (ref 70–99)
Glucose-Capillary: 260 mg/dL — ABNORMAL HIGH (ref 70–99)
Glucose-Capillary: 156 mg/dL — ABNORMAL HIGH (ref 70–99)
Glucose-Capillary: 212 mg/dL — ABNORMAL HIGH (ref 70–99)

## 2011-02-19 LAB — LIPID PANEL
Cholesterol: 83 mg/dL (ref 0–200)
LDL Cholesterol: 25 mg/dL (ref 0–99)
Total CHOL/HDL Ratio: 2.3 RATIO
VLDL: 22 mg/dL (ref 0–40)

## 2011-02-19 LAB — CBC
HCT: 29 % — ABNORMAL LOW (ref 36.0–46.0)
Hemoglobin: 8.5 g/dL — ABNORMAL LOW (ref 12.0–15.0)
MCH: 25.3 pg — ABNORMAL LOW (ref 26.0–34.0)
MCHC: 29.3 g/dL — ABNORMAL LOW (ref 30.0–36.0)
MCV: 85.6 fL (ref 78.0–100.0)
Platelets: 336 10*3/uL (ref 150–400)
RDW: 16.5 % — ABNORMAL HIGH (ref 11.5–15.5)
WBC: 7.7 10*3/uL (ref 4.0–10.5)

## 2011-02-19 LAB — D-DIMER, QUANTITATIVE: D-Dimer, Quant: 0.29 ug{FEU}/mL (ref 0.00–0.48)

## 2011-02-19 LAB — TYPE AND SCREEN: Unit division: 0

## 2011-02-19 MED ORDER — DIAZEPAM 5 MG PO TABS
5.0000 mg | ORAL_TABLET | ORAL | Status: AC
  Administered 2011-02-20: 5 mg via ORAL
  Filled 2011-02-19: qty 1

## 2011-02-19 MED ORDER — SODIUM CHLORIDE 0.9 % IJ SOLN: 3.0000 mL | Freq: Two times a day (BID) | INTRAMUSCULAR | Status: DC

## 2011-02-19 MED ORDER — NITROGLYCERIN IN D5W 200-5 MCG/ML-% IV SOLN
INTRAVENOUS | Status: AC
  Administered 2011-02-19: 5 ug/min via INTRAVENOUS
  Filled 2011-02-19: qty 250

## 2011-02-19 MED ORDER — SODIUM CHLORIDE 0.9 % IV SOLN
1.0000 mL/kg/h | INTRAVENOUS | Status: DC
  Administered 2011-02-20 (×2): 1 mL/kg/h via INTRAVENOUS

## 2011-02-19 MED ORDER — NITROGLYCERIN IN D5W 200-5 MCG/ML-% IV SOLN
10.0000 ug/min | INTRAVENOUS | Status: DC
  Administered 2011-02-19: 5 ug/min via INTRAVENOUS

## 2011-02-19 MED ORDER — ASPIRIN 81 MG PO CHEW
324.0000 mg | CHEWABLE_TABLET | ORAL | Status: AC
  Administered 2011-02-20: 324 mg via ORAL
  Filled 2011-02-19: qty 4

## 2011-02-19 MED ORDER — SODIUM CHLORIDE 0.9 % IJ SOLN: 3.0000 mL | INTRAMUSCULAR | Status: DC | PRN

## 2011-02-19 MED ORDER — SODIUM CHLORIDE 0.9 % IV SOLN: 250.0000 mL | INTRAVENOUS | Status: DC | PRN

## 2011-02-19 MED ORDER — HEPARIN BOLUS VIA INFUSION
2000.0000 [IU] | Freq: Once | INTRAVENOUS | Status: AC
  Administered 2011-02-19: 2000 [IU] via INTRAVENOUS
  Filled 2011-02-19: qty 2000

## 2011-02-19 NOTE — Progress Notes (Signed)
Pt c/o SOB and CP when breathing. No nausea or vomiting noted. VSS. O2 sats 95-97% on 2L of 02. EKG shows NSR. Dr Terrence Dupont made aware. Will order d-dimer per his request. Will continue to monitor closely. Hulen Luster, RN

## 2011-02-19 NOTE — Progress Notes (Signed)
ANTICOAGULATION CONSULT NOTE - Follow Up Consult  Pharmacy Consult for Heparin Indication: chest pain/ACS  No Known Allergies  Patient Measurements: Height: 4\' 11"  (149.9 cm) Weight: 144 lb 10 oz (65.6 kg) IBW/kg (Calculated) : 43.2   Vital Signs: Temp: 97.4 F (36.3 C) (01/14 0630) BP: 156/79 mmHg (01/14 0905) Pulse Rate: 73  (01/14 0630)  Labs:  Basename 02/19/11 0851 02/19/11 0500 02/19/11 0022 02/19/11 0021 02/18/11 1135 02/18/11 1130  HGB -- 8.6* -- -- -- 9.3*  HCT -- 29.1* -- -- -- 31.0*  PLT -- 336 -- -- -- 367  APTT -- -- -- -- -- --  LABPROT -- 13.6 -- -- -- 12.5  INR -- 1.02 -- -- -- 0.91  HEPARINUNFRC 0.22* -- -- 0.28* -- --  CREATININE -- 0.91 -- -- -- 0.96  CKTOTAL -- -- 62 -- 86 --  CKMB -- -- 1.8 -- 1.9 --  TROPONINI -- -- <0.30 -- <0.30 --   Estimated Creatinine Clearance: 52.8 ml/min (by C-G formula based on Cr of 0.91).   Medications:  Infusions:    . sodium chloride 20 mL/hr (02/18/11 1200)  . heparin 850 Units/hr (02/19/11 0205)    Assessment: 61 y/o female patient receiving heparin infusion for USAP r/o MI. Heparin level subtherapeutic, no bleeding reported, and all CE negative so far. Will re-bolus and increase gtt to get into therapeutic range. Continued sob, checking d-dimer.  Goal of Therapy:  Heparin level 0.3-0.7 units/ml   Plan:  Heparin 2000 unit IV bolus followed by increasing gtt to 1050 units/hr, check 6 hour heparin level.  Davonna Belling, PharmD, BCPS Pager [phone] 02/19/2011,10:13 AM

## 2011-02-19 NOTE — Progress Notes (Signed)
ANTICOAGULATION CONSULT NOTE - Follow Up Consult  Pharmacy Consult for Heparin Indication: chest pain/ACS  No Known Allergies  Patient Measurements: Height: 4\' 11"  (149.9 cm) Weight: 144 lb 10 oz (65.6 kg) IBW/kg (Calculated) : 43.2   Vital Signs: Temp: 98 F (36.7 C) (01/14 1400) Temp src: Oral (01/14 1400) BP: 131/67 mmHg (01/14 1400) Pulse Rate: 74  (01/14 1400)  Labs:  Basename 02/19/11 1702 02/19/11 0851 02/19/11 0500 02/19/11 0022 02/19/11 0021 02/18/11 1135 02/18/11 1130  HGB 8.5* -- 8.6* -- -- -- --  HCT 29.0* -- 29.1* -- -- -- 31.0*  PLT 332 -- 336 -- -- -- 367  APTT -- -- -- -- -- -- --  LABPROT -- -- 13.6 -- -- -- 12.5  INR -- -- 1.02 -- -- -- 0.91  HEPARINUNFRC 0.47 0.22* -- -- 0.28* -- --  CREATININE -- -- 0.91 -- -- -- 0.96  CKTOTAL -- -- -- 62 -- 86 --  CKMB -- -- -- 1.8 -- 1.9 --  TROPONINI -- -- -- <0.30 -- <0.30 --   Estimated Creatinine Clearance: 52.8 ml/min (by C-G formula based on Cr of 0.91).   Medications:  Infusions:     . sodium chloride 20 mL/hr (02/18/11 1200)  . sodium chloride    . heparin 1,050 Units/hr (02/19/11 1041)  . nitroGLYCERIN 5 mcg/min (02/19/11 1217)    Assessment:  USAP r/o MI with known CAD. Heparin level therapeutic at 0.47.  Goal of Therapy:  Heparin level 0.3-0.7 units/ml   Plan:  Continue IV heparin at 1050 units/hr and check next heparin level in am.

## 2011-02-19 NOTE — Progress Notes (Signed)
At 1200 Pt complained of 4/10 chest pain that radiated to the left arm and made arm feel "tingly like a rubber band." VSS.135/69 98.4, 80 NSR, 22 unlabored, 98% on 2L 02. 1 SL nitro given.   At 1205 pt has no relief from SL nitro complaining of 4/10 midsternal chest tightness and left arm "tingling" with pain also going to lower back. 1 SL nitro given. 126/75, HR 83 NSR, 97% 2L. EKG shows NSR with no changes noted. Dr. Terrence Dupont was made aware.  1215- Pt started on nitro drip at 90mcg/1.5ml. Pt states that the arm "tingling" is easing off. VSS. BP 116/74, 78 NSR, 22, 98% 2L.  Awaiting Dr. Terrence Dupont to come by and see patient. Will continue to monitor patient closely. Hulen Luster, RN

## 2011-02-19 NOTE — Progress Notes (Signed)
   CARE MANAGEMENT NOTE 02/19/2011  Patient:  CINDY, MONTROSS   Account Number:  [id number]  Date Initiated:  02/19/2011  Documentation initiated by:  GRAVES-BIGELOW,Laya Letendre  Subjective/Objective Assessment:   Pt admitted with cp. Placed on heparin and nitro gtt. Pt was having active cp on initial assessement. CM will come back by with questions. Unsure of whom pt lives with.     Action/Plan:   Pt will benefit from [facility] RN at d/c if able to be d/c home. Will also benefit from PT/OT consult when pt is stable.   Anticipated DC Date:  02/23/2011   Anticipated DC Plan:  [place]  CM consult      Choice offered to / List presented to:             Status of service:  In process, will continue to follow Medicare Important Message given?   (If response is "NO", the following Medicare IM given date fields will be blank) Date Medicare IM given:   Date Additional Medicare IM given:    Discharge Disposition:    Per UR Regulation:    Comments:  02-19-11 [place], RN,BSN [phone] CM will continue to f/u with d/c disposition needs.

## 2011-02-19 NOTE — Progress Notes (Signed)
MD made aware of pt still having intermittent arm tingling and numbness while on Nitro drip. New orders received will continue to monitor.

## 2011-02-19 NOTE — Progress Notes (Signed)
Subjective:  Patient complains of retrosternal chest pressure radiating to left arm off and on states feels similar in nature when she had PCI in the past Denies any nausea vomiting diaphoresis denies shortness of breath  Objective:  Vital Signs in the last 24 hours: Temp:  [97.4 F (36.3 C)-98.8 F (37.1 C)] 97.4 F (36.3 C) (01/14 0630) Pulse Rate:  [64-86] 73  (01/14 0630) Resp:  [20-24] 20  (01/14 0630) BP: (133-169)/(53-82) 156/79 mmHg (01/14 0905) SpO2:  [96 %-100 %] 96 % (01/14 0911) Weight:  [65.6 kg (144 lb 10 oz)] 65.6 kg (144 lb 10 oz) (01/13 1456)  Intake/Output from previous day:   Intake/Output from this shift:    Physical Exam: General appearance: alert and cooperative Neck: no carotid bruit, no JVD and supple, symmetrical, trachea midline Lungs: clear to auscultation bilaterally Heart: regular rate and rhythm, S1, S2 normal and Soft systolic murmur and S4 gallop Abdomen: soft, non-tender; bowel sounds normal; no masses,  no organomegaly Extremities: extremities normal, atraumatic, no cyanosis or edema  Lab Results:  Basename 02/19/11 0500 02/18/11 1130  WBC 6.0 7.1  HGB 8.6* 9.3*  PLT 336 367    Basename 02/19/11 0500 02/18/11 1130  NA 139 142  K 3.8 3.6  CL 104 105  CO2 23 28  GLUCOSE 112* 168*  BUN 13 11  CREATININE 0.91 0.96    Basename 02/19/11 0022 02/18/11 1135  TROPONINI <0.30 <0.30   Hepatic Function Panel  Basename 02/18/11 1130  PROT 8.6*  ALBUMIN 3.4*  AST 12  ALT 13  ALKPHOS 122*  BILITOT 0.5  BILIDIR --  IBILI --    Basename 02/19/11 0500  CHOL 83   No results found for this basename: PROTIME in the last 72 hours  Imaging: Dg Chest Port 1 View  02/18/2011  *RADIOLOGY REPORT*  Clinical Data: Chest pain and cough  PORTABLE CHEST - 1 VIEW  Comparison: 10/18/2010  Findings: Mild cardiomegaly.  Clear lungs.  No pneumothorax.  No pleural effusion.  No sign of interstitial edema.  IMPRESSION: Cardiomegaly without  decompensation.  Original Report Authenticated By: Jamas Lav, M.D.    Cardiac Studies:  Assessment/Plan:  Unstable angina MI ruled out Coronary artery disease history of PCI to left circumflex and RCA in the past Hypertension Non-insulin-dependent diabetes mellitus Hypercholesteremia Chronic anemia workup negative in past GERD History of tobacco abuse History of colonic polyp in the past Plan Discussed with patient various options of treatment i.e. noninvasive stress testing versus arm left cath and its risk and benefits i.e. death MI stroke need for margin C. CABG risk of restenosis local vascular complications etc. and consented for PCI   LOS: 1 day    [name] N 02/19/2011, 1:54 PM

## 2011-02-19 NOTE — Progress Notes (Signed)
[facility] NOTE  Pharmacy Consult for: Heparin  Indication: chest pain/ACS   Patient Data:   Allergies: No Known Allergies  Patient Measurements: Height: 4\' 11"  (149.9 cm) Weight: 144 lb 10 oz (65.6 kg) IBW/kg (Calculated) : 43.2  Heparin Dosing Weight: 59.5 kg  Vital Signs: Temp:  [97.8 F (36.6 C)-98.8 F (37.1 C)] 98.8 F (37.1 C) (01/13 2200) Pulse Rate:  [64-87] 69  (01/13 2220) Resp:  [18-24] 20  (01/13 2200) BP: (133-179)/(53-90) 133/53 mmHg (01/13 2220) SpO2:  [96 %-100 %] 98 % (01/13 2200) Weight:  [141 lb (63.957 kg)-144 lb 10 oz (65.6 kg)] 144 lb 10 oz (65.6 kg) (01/13 1456)  Intake/Output from previous day: No intake or output data in the 24 hours ending 02/19/11 0148  Labs:  Basename 02/19/11 0022 02/19/11 0021 02/18/11 1135 02/18/11 1130  HGB -- -- -- 9.3*  HCT -- -- -- 31.0*  PLT -- -- -- 367  APTT -- -- -- --  LABPROT -- -- -- 12.5  INR -- -- -- 0.91  HEPARINUNFRC -- 0.28* -- --  CREATININE -- -- -- 0.96  CKTOTAL 62 -- 86 --  CKMB 1.8 -- 1.9 --  TROPONINI <0.30 -- <0.30 --   Estimated Creatinine Clearance: 50.1 ml/min (by C-G formula based on Cr of 0.96).  Scheduled Medications:     . albuterol  2.5 mg Nebulization Q6H  . amLODipine  5 mg Oral Daily  . aspirin  324 mg Oral NOW   Or  . aspirin  300 mg Rectal NOW  . aspirin EC  81 mg Oral Daily  . clopidogrel  75 mg Oral Once  . clopidogrel  75 mg Oral Q breakfast  . heparin  3,500 Units Intravenous Once  . HYDROcodone-acetaminophen  2 tablet Oral Once  . insulin aspart  0-15 Units Subcutaneous TID WC  . LORazepam  1 mg Oral Once  . metFORMIN  500 mg Oral BID WC  . metoprolol tartrate  25 mg Oral BID  .  morphine injection  4 mg Intravenous Once  . nitroGLYCERIN  0.4 mg Sublingual Once  . olmesartan  10 mg Oral Daily  . ondansetron (ZOFRAN) IV  4 mg Intravenous Once  . pantoprazole (PROTONIX) IV  40 mg Intravenous Once  . rosuvastatin  20 mg Oral  Daily  . sodium chloride  3 mL Intravenous Q12H  . DISCONTD: HYDROcodone-acetaminophen  1 tablet Oral Once     Assessment:  61 y.o. female admitted on 02/18/2011 with r/o ACS on IV heparin. Heparin level is below-goal. No problem with line per RN.  Goal of Therapy:  1. Heparin level 0.3-0.7 units/ml   Plan:  1. Increase IV heparin to 850 units/hr.  2. Heparin level in 6 hours.   Monia Sabal Anasophia Pecor, PharmD 02/19/2011, 1:48 AM

## 2011-02-20 ENCOUNTER — Other Ambulatory Visit: Payer: Self-pay | Admitting: *Deleted

## 2011-02-20 ENCOUNTER — Encounter (HOSPITAL_COMMUNITY)
Admission: EM | Disposition: A | Discharge status: Home / Self Care | Payer: Self-pay | Source: Home / Self Care | Attending: Emergency Medicine

## 2011-02-20 HISTORY — PX: CORONARY ANGIOPLASTY WITH STENT PLACEMENT: SHX49

## 2011-02-20 HISTORY — PX: ABDOMINAL ANGIOGRAM: SHX5499

## 2011-02-20 HISTORY — PX: LEFT HEART CATHETERIZATION WITH CORONARY ANGIOGRAM: SHX5451

## 2011-02-20 LAB — CBC
MCV: 84 fL (ref 78.0–100.0)
RBC: 4.31 MIL/uL (ref 3.87–5.11)
RDW: 15.7 % — ABNORMAL HIGH (ref 11.5–15.5)
WBC: 7.7 10*3/uL (ref 4.0–10.5)

## 2011-02-20 LAB — GLUCOSE, CAPILLARY: Glucose-Capillary: 141 mg/dL — ABNORMAL HIGH (ref 70–99)

## 2011-02-20 SURGERY — LEFT HEART CATHETERIZATION WITH CORONARY ANGIOGRAM
Anesthesia: LOCAL

## 2011-02-20 MED ORDER — ALPRAZOLAM 0.25 MG PO TABS: 0.2500 mg | ORAL_TABLET | Freq: Two times a day (BID) | ORAL | Status: DC | PRN

## 2011-02-20 MED ORDER — LIDOCAINE HCL (PF) 1 % IJ SOLN
INTRAMUSCULAR | Status: AC
  Filled 2011-02-20: qty 30

## 2011-02-20 MED ORDER — SODIUM CHLORIDE 0.9 % IV SOLN
INTRAVENOUS | Status: AC
  Administered 2011-02-20: 11:00:00 via INTRAVENOUS

## 2011-02-20 MED ORDER — METOPROLOL SUCCINATE ER 50 MG PO TB24
50.0000 mg | ORAL_TABLET | Freq: Every day | ORAL | Status: DC
  Administered 2011-02-20 – 2011-02-21 (×2): 50 mg via ORAL
  Filled 2011-02-20 (×3): qty 1

## 2011-02-20 MED ORDER — FENTANYL CITRATE 0.05 MG/ML IJ SOLN
INTRAMUSCULAR | Status: AC
  Filled 2011-02-20: qty 2

## 2011-02-20 MED ORDER — ALBUTEROL SULFATE (5 MG/ML) 0.5% IN NEBU
2.5000 mg | INHALATION_SOLUTION | Freq: Four times a day (QID) | RESPIRATORY_TRACT | Status: DC
  Administered 2011-02-20 – 2011-02-21 (×5): 2.5 mg via RESPIRATORY_TRACT
  Filled 2011-02-20 (×6): qty 0.5

## 2011-02-20 MED ORDER — HEPARIN (PORCINE) IN NACL 2-0.9 UNIT/ML-% IJ SOLN
INTRAMUSCULAR | Status: AC
  Filled 2011-02-20: qty 2000

## 2011-02-20 MED ORDER — MIDAZOLAM HCL 2 MG/2ML IJ SOLN
INTRAMUSCULAR | Status: AC
  Filled 2011-02-20: qty 2

## 2011-02-20 MED ORDER — NITROGLYCERIN 0.2 MG/ML ON CALL CATH LAB
INTRAVENOUS | Status: AC
  Filled 2011-02-20: qty 1

## 2011-02-20 MED ORDER — ASPIRIN EC 81 MG PO TBEC
81.0000 mg | DELAYED_RELEASE_TABLET | Freq: Every day | ORAL | Status: DC
  Administered 2011-02-20 – 2011-02-21 (×2): 81 mg via ORAL
  Filled 2011-02-20 (×2): qty 1

## 2011-02-20 MED ORDER — ONDANSETRON HCL 4 MG/2ML IJ SOLN: 4.0000 mg | Freq: Four times a day (QID) | INTRAMUSCULAR | Status: DC | PRN

## 2011-02-20 MED ORDER — ALUM & MAG HYDROXIDE-SIMETH 200-200-20 MG/5ML PO SUSP
30.0000 mL | ORAL | Status: DC | PRN
  Administered 2011-02-20 (×2): 30 mL via ORAL
  Filled 2011-02-20 (×2): qty 30

## 2011-02-20 MED ORDER — OXYCODONE-ACETAMINOPHEN 5-325 MG PO TABS: 1.0000 | ORAL_TABLET | ORAL | Status: DC | PRN

## 2011-02-20 MED ORDER — ACETAMINOPHEN 325 MG PO TABS: 650.0000 mg | ORAL_TABLET | ORAL | Status: DC | PRN

## 2011-02-20 MED ORDER — PANTOPRAZOLE SODIUM 40 MG PO TBEC
40.0000 mg | DELAYED_RELEASE_TABLET | Freq: Every day | ORAL | Status: DC
  Administered 2011-02-20 – 2011-02-21 (×2): 40 mg via ORAL
  Filled 2011-02-20 (×2): qty 1

## 2011-02-20 NOTE — Op Note (Signed)
Left cardiac cath report dictated on 02/20/2011 dictation number is [id number]

## 2011-02-20 NOTE — Progress Notes (Signed)
   CARE MANAGEMENT NOTE 02/20/2011  Patient:  [name], [name]   Account Number:  [id number]  Date Initiated:  02/19/2011  Documentation initiated by:  GRAVES-BIGELOW,Carlise Stofer  Subjective/Objective Assessment:   Pt admitted with cp. Placed on heparin and nitro gtt. Pt was having active cp on initial assessement. CM will come back by with questions. Unsure of whom pt lives with.     Action/Plan:   Pt will benefit from [facility] RN at d/c if able to be d/c home. Will also benefit from PT/OT consult when pt is stable.   Anticipated DC Date:  02/23/2011   Anticipated DC Plan:  [place]  CM consult      Choice offered to / List presented to:             Status of service:  Completed, signed off Medicare Important Message given?   (If response is "NO", the following Medicare IM given date fields will be blank) Date Medicare IM given:   Date Additional Medicare IM given:    Discharge Disposition:  HOME/SELF CARE  Per UR Regulation:    Comments:  02-20-11 [address] Jacqlyn Krauss, RN,BSN [phone] CM spoke to pt  and she is from home alone. Family is in [place]. She has an aide from [employer] [facility] that comes out M-F for 4 hours per day. CM  spoke to pt about having a RN visit when d/c and she declines at this time. Unsure of d/c date at this time. Pt was still c/o chest pain during assessment.  02-19-11 1225 Jacqlyn Krauss, [place] [phone] CM will continue to f/u with d/c disposition needs.

## 2011-02-20 NOTE — Progress Notes (Signed)
Pt c/o indigestion. Maalox ordered per cardiology prn orders. Will continue to monitor effectiveness. Hulen Luster, RN

## 2011-02-20 NOTE — Progress Notes (Signed)
Pt in the cath lab. Will revisit later.

## 2011-02-21 LAB — CBC
HCT: 38.9 % (ref 36.0–46.0)
MCV: 84.7 fL (ref 78.0–100.0)
RBC: 4.59 MIL/uL (ref 3.87–5.11)
WBC: 9.4 10*3/uL (ref 4.0–10.5)

## 2011-02-21 LAB — GLUCOSE, CAPILLARY: Glucose-Capillary: 115 mg/dL — ABNORMAL HIGH (ref 70–99)

## 2011-02-21 MED ORDER — ROSUVASTATIN CALCIUM 10 MG PO TABS: 10.0000 mg | ORAL_TABLET | Freq: Every day | ORAL | Status: DC

## 2011-02-21 MED ORDER — AMOXICILLIN-POT CLAVULANATE 875-125 MG PO TABS: 1.0000 | ORAL_TABLET | Freq: Two times a day (BID) | ORAL | Status: AC

## 2011-02-21 MED ORDER — AMOXICILLIN-POT CLAVULANATE 875-125 MG PO TABS
1.0000 | ORAL_TABLET | Freq: Two times a day (BID) | ORAL | Status: DC
  Filled 2011-02-21 (×2): qty 1

## 2011-02-21 MED ORDER — IRBESARTAN 150 MG PO TABS: 150.0000 mg | ORAL_TABLET | Freq: Every day | ORAL | Status: DC

## 2011-02-21 MED ORDER — FAMOTIDINE 20 MG PO TABS: 20.0000 mg | ORAL_TABLET | Freq: Two times a day (BID) | ORAL | Status: DC

## 2011-02-21 NOTE — Discharge Summary (Signed)
  Discharge summary dictated on 02/21/2011 dictation number is [id number]

## 2011-02-21 NOTE — Progress Notes (Signed)
Pt was provided with discharge information and education. Pt has prescriptions in hand and knows to call Dr. Aaron Mose office to make a follow up appointment for next week. Pt is in no distress at this time. Pt request one last breathing treatment prior to discharge. Will give albuterol breathing treatment and then pt will leave floor with her aide from home. Hulen Luster, RN

## 2011-02-21 NOTE — Cardiovascular Report (Signed)
NAMELAYLANNI, PEDLEY NO.:  [id number]  MEDICAL RECORD NO.:  [id number]  LOCATION:  [id number]                         FACILITY:  [facility]  PHYSICIAN:  Maximiano Lott N. Terrence Dupont, M.D. DATE OF BIRTH:  1950/08/07  DATE OF PROCEDURE: DATE OF DISCHARGE:                           CARDIAC CATHETERIZATION   PROCEDURE:  Left cardiac catheterization with selective left and right coronary angiography, left ventriculography via aortography, visualization of bilateral renal arteries via left groin using Judkins technique.  INDICATION FOR THE PROCEDURE:  [name] is a 61 year old black female with past medical history significant for coronary artery disease, status post PTCA and stenting to left circumflex in 2008, and PTCA and stenting to RCA in 2009, hypertension, non-insulin-dependent diabetes mellitus, hypercholesteremia, history of colonic polyps, status post perforation of the colon during polypectomy, subsequently had partial colectomy, anemia and GERD.  She was admitted by Dr. Doylene Canard because of retrosternal chest pain described as pressure, initially nonradiating and then radiating to the left arm associated with nausea.  The patient was ruled out for myocardial infarction.  The patient had multiple episodes of chest pain described as pressure radiating to the left arm during the hospital stay.  Discussed with the patient regarding various options of treatment, i.e., noninvasive stress testing versus left cath, possible PTCA and stenting, its risks and benefits, i.e., death, MI, stroke, need for emergency CABG, risk of restenosis, local vascular complications, etc. and consented for the procedure.  PROCEDURE:  After obtaining the informed consent, the patient was brought to the cath lab and was placed on fluoroscopy table.  Right groin was prepped and draped in usual fashion.  Xylocaine 1% was used for local anesthesia in the right groin.  With the help of thin wall needle,  attempted to insert the sheath in the right femoral artery without success, and then a 5-French arterial sheath was placed via the left femoral artery without difficulty.  Sheath was aspirated and flushed.  Next, 6-French left Judkins catheter was advanced over the wire under fluoroscopic guidance up to the ascending aorta.  Wire was pulled out.  The catheter was aspirated and connected to the Manifold. Catheter was further advanced and engaged into left coronary ostium. Multiple views of the left system were taken.  Next, the catheter was disengaged and was pulled out over the wire and was replaced with 5- [place] right Judkins catheter, which was advanced over the wire under fluoroscopic guidance up to the ascending aorta.  Wire was pulled out. The catheter was aspirated and connected to the Manifold.  Catheter was further advanced and engaged into right coronary ostium.  Multiple views of the right system were taken.  Next, the catheter was disengaged and was pulled out over the wire and was replaced with a 5-French pigtail catheter which was advanced over the wire under fluoroscopic guidance up to the ascending aorta.  Wire was pulled out.  The catheter was aspirated and connected to the Manifold.  Catheter was further advanced across the aortic valve into the LV.  LV pressures were recorded.  Next, LV graphy was done in 30 degree RAO position.  Post angiographic pressures were recorded from LV and then pullback pressures  were recorded from the aorta.  There was no gradient across the aortic valve. Next, the pigtail catheter was pulled out over the wire.  Sheaths were aspirated and flushed.  FINDINGS:  LV showed mild global hypokinesia, EF of approximately 50%. Left main was patent.  LAD has 10-15% proximal stenosis.  Diagonal 1 has 20% ostial stenosis.  Diagonal 2 is very small.  Left circumflex has 40- 50% proximal sequential stenosis.  Stented midportion is patent.  OM 1 and OM 2  were very, very small.  OM 3 and 4 were small which were patent.  RCA was patent at prior PTCA stenting site.  Aortography showed no aortic aneurysm.  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  Left renal artery is patent.  The patient tolerated the procedure well.  There were no complications.  The patient was transferred to recovery room in stable condition.     [name]. Terrence Dupont, M.D.     MNH/MEDQ  D:  02/20/2011  T:  02/21/2011  Job:  [id number]

## 2011-02-22 ENCOUNTER — Other Ambulatory Visit: Payer: Self-pay

## 2011-02-22 MED ORDER — ONDANSETRON HCL 4 MG PO TABS: 4.0000 mg | ORAL_TABLET | Freq: Every day | ORAL | Status: DC | PRN

## 2011-02-22 NOTE — Discharge Summary (Signed)
NAMEVONETTE, [name] NO.:  [id number]  MEDICAL RECORD NO.:  [id number]  LOCATION:  [id number]                         FACILITY:  [facility]  PHYSICIAN:  Dejha King N. Terrence Dupont, M.D. DATE OF BIRTH:  February 03, 1951  DATE OF ADMISSION:  02/18/2011 DATE OF DISCHARGE:  02/21/2011                              DISCHARGE SUMMARY   ADMITTING DIAGNOSES:  Chest pain rule out myocardial infarction, coronary artery disease, status post PCI to RCA and left circumflex in the past, hypertension, diabetes mellitus, hypercholesteremia, history of myocardial infarction in the past, gastroesophageal reflux disease, history of colonic polyp, anemia, cholelithiasis.  DISCHARGE DIAGNOSES:  Status post chest pain, myocardial infarction ruled out, status post left cath, coronary artery disease, stable, status post PTCA stenting to left circumflex and RCA in the past with patent stents, hypertension, non-insulin-dependent diabetes mellitus, hypercholesteremia, chronic anemia, gastroesophageal reflux disease, history of tobacco abuse, history of colonic polyp in the past.  DISCHARGE HOME MEDICATIONS: 1. Enteric-coated aspirin 81 mg 1 tablet daily. 2. Amlodipine 5 mg 1 tablet daily. 3. Pepcid 20 mg 1 tablet twice daily. 4. Albuterol inhaler 2 puffs every 6 h. as before. 5. Atorvastatin 20 mg 1 tablet daily. 6. Avapro 150 mg 1 tablet daily. 7. Metformin 500 mg 1 tablet twice daily starting from tomorrow. 8. Metoprolol succinate 50 mg daily. 9. Nitrostat 0.4 mg sublingual use as directed. 10.Nitrostat 0.4 mg sublingual every 8 h. use as directed.  The patient has been advised to start diet low salt, low cholesterol 1800 calories ADA diet.  The patient has been advised to monitor blood pressure and blood sugar daily.  Post cardiac cath instructions have been given.  Follow up with me in 1 week.  CONDITION AT DISCHARGE:  Stable.  BRIEF HISTORY AND HOSPITAL COURSE:  [name] is a 61 year old  black female with past medical historysignificant for coronary artery disease, status post PTCA stenting to left circumflex and RCA, hypertension, non- insulin-dependent diabetes mellitus, hypercholesteremia, history of colonic polyp, status post perforation of the colon during polypectomy, subsequently had partial colectomy, anemia and GERD was admitted by Dr. Doylene Canard on February 17, 2010, because of retrosternal chest pain described as pressure, initially nonradiating and then radiated to the left arm associated with nausea.  The patient was ruled out for myocardial infarction.  The patient had multiple episodes of chest pain described as pressure radiating to the left arm subsequently during the hospital stay.  The patient denies any palpitation, lightheadedness or syncope.  Denies PND, orthopnea or leg swelling.  PAST MEDICAL HISTORY:  As above.  PAST SURGICAL HISTORY:  She had partial right colectomy in the past, had tubal ligation in the past, had PTCA stenting as above in the past.  FAMILY HISTORY:  Positive for coronary artery disease and colon cancer and diabetes mellitus.  PHYSICAL EXAMINATION:  GENERAL:  She was alert, awake, oriented x3, in no acute distress. VITAL SIGNS:  Blood pressure was 153/63, pulse was 75.  She was afebrile. EYES:  Conjunctivae was pink. NECK:  Supple.  No JVD.  No bruit. LUNGS:  Clear to auscultation without rhonchi or rales. CARDIOVASCULAR:  S1 and S2 was normal.  There was no  murmur, click or gallop. ABDOMEN:  Soft.  Bowel sounds are present.  Nontender. EXTREMITIES:  There is no clubbing, cyanosis or edema. NEURO:  Grossly intact.  LABORATORY DATA:  Sodium was 139, potassium 3.8, chloride 104, glucose 148, BUN 13, creatinine 0.91.  Hemoglobin was 8.6, hematocrit 29.1, white count of 6.0.  Cardiac enzymes were negative.  The patient's repeat hemoglobin was 8.5, hematocrit 29.  The patient did receive 2 units of packed RBCs because of  recurrent chest pain and significant CAD.  Repeat hemoglobin was 11.5, hematocrit 36.2, white count of 7.7, hemoglobin today is 12.1, hematocrit 38.9, white count of 9.4.  Her D- dimers were negative 0.29.  BRIEF HOSPITAL COURSE:  The patient was admitted to Telemetry Unit.  MI was ruled out by serial enzymes and EKG.  The patient subsequently underwent left cardiac cath with selective left and right coronary angiography as per procedure report.  The patient did not have any further episodes of chest pain during the hospital stay.  Her groin is stable with no evidence of hematoma or bruit.  The patient will be discharged home on above medications, and will be followed up in my office in 1 week.     [name]. Terrence Dupont, M.D.     MNH/MEDQ  D:  02/21/2011  T:  02/22/2011  Job:  [id number]

## 2011-03-19 ENCOUNTER — Other Ambulatory Visit: Payer: Self-pay

## 2011-03-19 ENCOUNTER — Encounter (HOSPITAL_COMMUNITY): Payer: Self-pay

## 2011-03-19 ENCOUNTER — Inpatient Hospital Stay (HOSPITAL_COMMUNITY)
Admission: EM | Admit: 2011-03-19 | Discharge: 2011-03-27 | DRG: 189 | Disposition: A | Discharge status: Home / Self Care | Payer: Medicaid Other | Attending: Internal Medicine | Admitting: Internal Medicine

## 2011-03-19 ENCOUNTER — Emergency Department (HOSPITAL_COMMUNITY): Payer: Medicaid Other

## 2011-03-19 DIAGNOSIS — J45901 Unspecified asthma with (acute) exacerbation: Secondary | ICD-10-CM | POA: Diagnosis present

## 2011-03-19 DIAGNOSIS — E1122 Type 2 diabetes mellitus with diabetic chronic kidney disease: Secondary | ICD-10-CM | POA: Diagnosis present

## 2011-03-19 DIAGNOSIS — Z9861 Coronary angioplasty status: Secondary | ICD-10-CM

## 2011-03-19 DIAGNOSIS — Z7982 Long term (current) use of aspirin: Secondary | ICD-10-CM

## 2011-03-19 DIAGNOSIS — K219 Gastro-esophageal reflux disease without esophagitis: Secondary | ICD-10-CM

## 2011-03-19 DIAGNOSIS — IMO0001 Reserved for inherently not codable concepts without codable children: Secondary | ICD-10-CM | POA: Diagnosis present

## 2011-03-19 DIAGNOSIS — Z860100 Personal history of colon polyps, unspecified: Secondary | ICD-10-CM

## 2011-03-19 DIAGNOSIS — J449 Chronic obstructive pulmonary disease, unspecified: Secondary | ICD-10-CM

## 2011-03-19 DIAGNOSIS — J209 Acute bronchitis, unspecified: Secondary | ICD-10-CM | POA: Diagnosis present

## 2011-03-19 DIAGNOSIS — R079 Chest pain, unspecified: Secondary | ICD-10-CM | POA: Diagnosis present

## 2011-03-19 DIAGNOSIS — J9601 Acute respiratory failure with hypoxia: Secondary | ICD-10-CM | POA: Diagnosis present

## 2011-03-19 DIAGNOSIS — R0789 Other chest pain: Secondary | ICD-10-CM | POA: Diagnosis present

## 2011-03-19 DIAGNOSIS — Z833 Family history of diabetes mellitus: Secondary | ICD-10-CM

## 2011-03-19 DIAGNOSIS — R0609 Other forms of dyspnea: Secondary | ICD-10-CM | POA: Diagnosis present

## 2011-03-19 DIAGNOSIS — Z794 Long term (current) use of insulin: Secondary | ICD-10-CM

## 2011-03-19 DIAGNOSIS — K7689 Other specified diseases of liver: Secondary | ICD-10-CM | POA: Diagnosis present

## 2011-03-19 DIAGNOSIS — I252 Old myocardial infarction: Secondary | ICD-10-CM

## 2011-03-19 DIAGNOSIS — J441 Chronic obstructive pulmonary disease with (acute) exacerbation: Secondary | ICD-10-CM | POA: Diagnosis present

## 2011-03-19 DIAGNOSIS — I2510 Atherosclerotic heart disease of native coronary artery without angina pectoris: Secondary | ICD-10-CM | POA: Insufficient documentation

## 2011-03-19 DIAGNOSIS — I10 Essential (primary) hypertension: Secondary | ICD-10-CM | POA: Diagnosis present

## 2011-03-19 DIAGNOSIS — E785 Hyperlipidemia, unspecified: Secondary | ICD-10-CM | POA: Diagnosis present

## 2011-03-19 DIAGNOSIS — D649 Anemia, unspecified: Secondary | ICD-10-CM

## 2011-03-19 DIAGNOSIS — R1310 Dysphagia, unspecified: Secondary | ICD-10-CM | POA: Diagnosis present

## 2011-03-19 DIAGNOSIS — E119 Type 2 diabetes mellitus without complications: Secondary | ICD-10-CM

## 2011-03-19 DIAGNOSIS — F411 Generalized anxiety disorder: Secondary | ICD-10-CM | POA: Diagnosis present

## 2011-03-19 DIAGNOSIS — D638 Anemia in other chronic diseases classified elsewhere: Secondary | ICD-10-CM | POA: Diagnosis present

## 2011-03-19 DIAGNOSIS — J9600 Acute respiratory failure, unspecified whether with hypoxia or hypercapnia: Principal | ICD-10-CM | POA: Diagnosis present

## 2011-03-19 DIAGNOSIS — E118 Type 2 diabetes mellitus with unspecified complications: Secondary | ICD-10-CM | POA: Diagnosis present

## 2011-03-19 DIAGNOSIS — D539 Nutritional anemia, unspecified: Secondary | ICD-10-CM | POA: Diagnosis present

## 2011-03-19 DIAGNOSIS — Z87891 Personal history of nicotine dependence: Secondary | ICD-10-CM

## 2011-03-19 DIAGNOSIS — I219 Acute myocardial infarction, unspecified: Secondary | ICD-10-CM

## 2011-03-19 DIAGNOSIS — T380X5A Adverse effect of glucocorticoids and synthetic analogues, initial encounter: Secondary | ICD-10-CM | POA: Diagnosis present

## 2011-03-19 LAB — BASIC METABOLIC PANEL
Calcium: 10.2 mg/dL (ref 8.4–10.5)
Creatinine, Ser: 0.89 mg/dL (ref 0.50–1.10)
GFR calc non Af Amer: 68 mL/min — ABNORMAL LOW (ref >90)
Glucose, Bld: 103 mg/dL — ABNORMAL HIGH (ref 70–99)
Sodium: 142 mEq/L (ref 135–145)

## 2011-03-19 LAB — CARDIAC PANEL(CRET KIN+CKTOT+MB+TROPI)
CK, MB: 1.9 ng/mL (ref 0.3–4.0)
CK, MB: 1.9 ng/mL (ref 0.3–4.0)
Troponin I: <0.3 ng/mL (ref <0.30)
Troponin I: <0.3 ng/mL (ref <0.30)

## 2011-03-19 LAB — POCT I-STAT 3, ART BLOOD GAS (G3+)
Bicarbonate: 22.3 mEq/L (ref 20.0–24.0)
Patient temperature: 98.6
TCO2: 23 mmol/L (ref 0–100)
pCO2 arterial: 38.4 mmHg (ref 35.0–45.0)
pH, Arterial: 7.373 (ref 7.350–7.400)
pO2, Arterial: 66 mmHg — ABNORMAL LOW (ref 80.0–100.0)

## 2011-03-19 LAB — DIFFERENTIAL
Eosinophils Absolute: 0.3 10*3/uL (ref 0.0–0.7)
Eosinophils Relative: 3 % (ref 0–5)
Lymphs Abs: 2.5 10*3/uL (ref 0.7–4.0)
Monocytes Absolute: 1 10*3/uL (ref 0.1–1.0)

## 2011-03-19 LAB — CBC
MCH: 18.3 pg — ABNORMAL LOW (ref 26.0–34.0)
MCV: 86.6 fL (ref 78.0–100.0)
Platelets: 192 10*3/uL (ref 150–400)
RDW: 17.2 % — ABNORMAL HIGH (ref 11.5–15.5)

## 2011-03-19 LAB — GLUCOSE, CAPILLARY: Glucose-Capillary: 233 mg/dL — ABNORMAL HIGH (ref 70–99)

## 2011-03-19 LAB — OCCULT BLOOD, POC DEVICE: Fecal Occult Bld: NEGATIVE

## 2011-03-19 LAB — PRO B NATRIURETIC PEPTIDE: Pro B Natriuretic peptide (BNP): 23.5 pg/mL (ref 0–125)

## 2011-03-19 MED ORDER — ALBUTEROL SULFATE (5 MG/ML) 0.5% IN NEBU
2.5000 mg | INHALATION_SOLUTION | RESPIRATORY_TRACT | Status: DC | PRN
  Administered 2011-03-20 – 2011-03-24 (×4): 2.5 mg via RESPIRATORY_TRACT
  Filled 2011-03-19 (×5): qty 0.5

## 2011-03-19 MED ORDER — INSULIN GLARGINE 100 UNIT/ML ~~LOC~~ SOLN
5.0000 [IU] | Freq: Once | SUBCUTANEOUS | Status: AC
  Administered 2011-03-19: 5 [IU] via SUBCUTANEOUS
  Filled 2011-03-19: qty 3

## 2011-03-19 MED ORDER — ALBUTEROL SULFATE (5 MG/ML) 0.5% IN NEBU
2.5000 mg | INHALATION_SOLUTION | Freq: Four times a day (QID) | RESPIRATORY_TRACT | Status: DC
  Administered 2011-03-20 – 2011-03-21 (×7): 2.5 mg via RESPIRATORY_TRACT
  Filled 2011-03-19 (×7): qty 0.5

## 2011-03-19 MED ORDER — ALBUTEROL (5 MG/ML) CONTINUOUS INHALATION SOLN
INHALATION_SOLUTION | RESPIRATORY_TRACT | Status: AC
  Filled 2011-03-19: qty 20

## 2011-03-19 MED ORDER — OLMESARTAN MEDOXOMIL 20 MG PO TABS
20.0000 mg | ORAL_TABLET | Freq: Every day | ORAL | Status: DC
  Administered 2011-03-20 – 2011-03-21 (×2): 20 mg via ORAL
  Filled 2011-03-19 (×2): qty 1

## 2011-03-19 MED ORDER — SODIUM CHLORIDE 0.9 % IJ SOLN
3.0000 mL | Freq: Two times a day (BID) | INTRAMUSCULAR | Status: DC
  Administered 2011-03-19 – 2011-03-25 (×12): 3 mL via INTRAVENOUS

## 2011-03-19 MED ORDER — SODIUM CHLORIDE 0.9 % IJ SOLN
3.0000 mL | Freq: Two times a day (BID) | INTRAMUSCULAR | Status: DC
  Administered 2011-03-21 – 2011-03-24 (×5): 3 mL via INTRAVENOUS

## 2011-03-19 MED ORDER — AMLODIPINE BESYLATE 5 MG PO TABS
5.0000 mg | ORAL_TABLET | Freq: Every day | ORAL | Status: DC
  Administered 2011-03-20 – 2011-03-21 (×2): 5 mg via ORAL
  Filled 2011-03-19 (×2): qty 1

## 2011-03-19 MED ORDER — ALBUTEROL SULFATE (5 MG/ML) 0.5% IN NEBU
15.0000 mg | INHALATION_SOLUTION | Freq: Once | RESPIRATORY_TRACT | Status: AC
  Administered 2011-03-19: 15 mg via RESPIRATORY_TRACT

## 2011-03-19 MED ORDER — NITROGLYCERIN 0.4 MG SL SUBL
0.4000 mg | SUBLINGUAL_TABLET | SUBLINGUAL | Status: DC | PRN
  Administered 2011-03-19: 0.4 mg via SUBLINGUAL

## 2011-03-19 MED ORDER — BUDESONIDE 0.5 MG/2ML IN SUSP
0.5000 mg | Freq: Two times a day (BID) | RESPIRATORY_TRACT | Status: DC
  Administered 2011-03-20 – 2011-03-27 (×13): 0.5 mg via RESPIRATORY_TRACT
  Filled 2011-03-19 (×20): qty 2

## 2011-03-19 MED ORDER — FAMOTIDINE 20 MG PO TABS
20.0000 mg | ORAL_TABLET | Freq: Two times a day (BID) | ORAL | Status: DC
  Administered 2011-03-19 – 2011-03-21 (×4): 20 mg via ORAL
  Filled 2011-03-19 (×5): qty 1

## 2011-03-19 MED ORDER — METHYLPREDNISOLONE SODIUM SUCC 125 MG IJ SOLR
125.0000 mg | Freq: Once | INTRAMUSCULAR | Status: AC
  Administered 2011-03-19: 125 mg via INTRAVENOUS
  Filled 2011-03-19: qty 2

## 2011-03-19 MED ORDER — IPRATROPIUM BROMIDE 0.02 % IN SOLN
0.5000 mg | Freq: Once | RESPIRATORY_TRACT | Status: AC
  Administered 2011-03-19: 0.5 mg via RESPIRATORY_TRACT
  Filled 2011-03-19: qty 2.5

## 2011-03-19 MED ORDER — ALBUTEROL SULFATE (5 MG/ML) 0.5% IN NEBU: 20.0000 mg | INHALATION_SOLUTION | Freq: Once | RESPIRATORY_TRACT | Status: DC

## 2011-03-19 MED ORDER — PANTOPRAZOLE SODIUM 40 MG PO TBEC
40.0000 mg | DELAYED_RELEASE_TABLET | Freq: Every day | ORAL | Status: DC
  Administered 2011-03-20 – 2011-03-21 (×2): 40 mg via ORAL
  Filled 2011-03-19 (×2): qty 1

## 2011-03-19 MED ORDER — MOXIFLOXACIN HCL IN NACL 400 MG/250ML IV SOLN
400.0000 mg | Freq: Once | INTRAVENOUS | Status: AC
  Administered 2011-03-19: 400 mg via INTRAVENOUS
  Filled 2011-03-19: qty 250

## 2011-03-19 MED ORDER — METOPROLOL SUCCINATE ER 50 MG PO TB24
50.0000 mg | ORAL_TABLET | Freq: Every day | ORAL | Status: DC
  Administered 2011-03-20 – 2011-03-21 (×2): 50 mg via ORAL
  Filled 2011-03-19 (×2): qty 1

## 2011-03-19 MED ORDER — ONDANSETRON HCL 4 MG/2ML IJ SOLN: 4.0000 mg | Freq: Four times a day (QID) | INTRAMUSCULAR | Status: DC | PRN

## 2011-03-19 MED ORDER — METHYLPREDNISOLONE SODIUM SUCC 125 MG IJ SOLR
60.0000 mg | Freq: Four times a day (QID) | INTRAMUSCULAR | Status: DC
  Administered 2011-03-19 – 2011-03-21 (×7): 60 mg via INTRAVENOUS
  Administered 2011-03-21: 04:00:00 via INTRAVENOUS
  Filled 2011-03-19 (×3): qty 0.96
  Filled 2011-03-19: qty 2
  Filled 2011-03-19 (×4): qty 0.96
  Filled 2011-03-19: qty 2
  Filled 2011-03-19: qty 0.96

## 2011-03-19 MED ORDER — ACETAMINOPHEN 650 MG RE SUPP: 650.0000 mg | Freq: Four times a day (QID) | RECTAL | Status: DC | PRN

## 2011-03-19 MED ORDER — PANTOPRAZOLE SODIUM 40 MG IV SOLR
40.0000 mg | INTRAVENOUS | Status: DC
  Administered 2011-03-19 – 2011-03-20 (×2): 40 mg via INTRAVENOUS
  Filled 2011-03-19 (×3): qty 40

## 2011-03-19 MED ORDER — ALBUTEROL SULFATE (5 MG/ML) 0.5% IN NEBU
10.0000 mg | INHALATION_SOLUTION | Freq: Once | RESPIRATORY_TRACT | Status: AC
  Administered 2011-03-19: 10 mg via RESPIRATORY_TRACT
  Filled 2011-03-19: qty 2

## 2011-03-19 MED ORDER — ALBUTEROL (5 MG/ML) CONTINUOUS INHALATION SOLN
15.0000 mg/h | INHALATION_SOLUTION | RESPIRATORY_TRACT | Status: AC
  Administered 2011-03-19: 15 mg/h via RESPIRATORY_TRACT
  Filled 2011-03-19: qty 20

## 2011-03-19 MED ORDER — SIMVASTATIN 40 MG PO TABS
40.0000 mg | ORAL_TABLET | Freq: Every day | ORAL | Status: DC
  Administered 2011-03-20 – 2011-03-27 (×8): 40 mg via ORAL
  Filled 2011-03-19 (×8): qty 1

## 2011-03-19 MED ORDER — MORPHINE SULFATE 2 MG/ML IJ SOLN
1.0000 mg | INTRAMUSCULAR | Status: DC | PRN
  Administered 2011-03-20: 1 mg via INTRAVENOUS
  Administered 2011-03-20: 18:00:00 via INTRAVENOUS
  Administered 2011-03-20 – 2011-03-23 (×4): 1 mg via INTRAVENOUS
  Filled 2011-03-19 (×6): qty 1

## 2011-03-19 MED ORDER — MOXIFLOXACIN HCL IN NACL 400 MG/250ML IV SOLN
400.0000 mg | INTRAVENOUS | Status: AC
  Administered 2011-03-20 – 2011-03-21 (×2): 400 mg via INTRAVENOUS
  Filled 2011-03-19 (×3): qty 250

## 2011-03-19 MED ORDER — NITROGLYCERIN 0.4 MG SL SUBL: 0.4000 mg | SUBLINGUAL_TABLET | SUBLINGUAL | Status: DC | PRN

## 2011-03-19 MED ORDER — ASPIRIN EC 325 MG PO TBEC
325.0000 mg | DELAYED_RELEASE_TABLET | Freq: Every day | ORAL | Status: DC
  Administered 2011-03-20 – 2011-03-27 (×8): 325 mg via ORAL
  Filled 2011-03-19 (×8): qty 1

## 2011-03-19 MED ORDER — ACETAMINOPHEN 325 MG PO TABS
650.0000 mg | ORAL_TABLET | Freq: Four times a day (QID) | ORAL | Status: DC | PRN
  Administered 2011-03-19 – 2011-03-26 (×4): 650 mg via ORAL
  Filled 2011-03-19 (×4): qty 2

## 2011-03-19 MED ORDER — INSULIN ASPART 100 UNIT/ML ~~LOC~~ SOLN
0.0000 [IU] | Freq: Three times a day (TID) | SUBCUTANEOUS | Status: DC
  Administered 2011-03-20 (×3): 5 [IU] via SUBCUTANEOUS
  Administered 2011-03-21: 3 [IU] via SUBCUTANEOUS
  Filled 2011-03-19: qty 3

## 2011-03-19 MED ORDER — ONDANSETRON HCL 4 MG PO TABS: 4.0000 mg | ORAL_TABLET | Freq: Four times a day (QID) | ORAL | Status: DC | PRN

## 2011-03-19 NOTE — ED Provider Notes (Signed)
6:39 PM  I performed a history and physical examination of [name] and discussed her management with Dr. Marinda Elk.  I agree with the history, physical, assessment, and plan of care, with the following exceptions: None  The patient is in moderate respiratory distress, with accessory muscle usage, prolonged exhalation, and audible wheezing, but with good air movement in all lung fields. She exhibits no signs of respiratory fatigue. She can speak in 3-4 word sentences at a time. She is handling her secretions well and is awake, alert, and oriented to person, place, time, and event. She reports that her dyspnea has been ongoing for 3 days. It appears that we will need to admit the patient for further treatment as she has not improved enough at this time to be discharged home. On auscultation of the patient's lungs, findings are as above, and her heart rate is mildly tachycardic to approximately 105 beats per minute, and a regular rate and rhythm. Skin is warm and dry.  I was present for the following procedures: None Time Spent in Critical Care of the patient: None Time spent in discussions with the patient and family: 5 minutes.  Catalina Antigua, MD 03/19/11 1840

## 2011-03-19 NOTE — ED Notes (Signed)
Attempt to call report x 1, RN unable.

## 2011-03-19 NOTE — Consult Note (Signed)
Reason for Consult: Chest pain Referring Physician: [facility] hospitalist  [name] is an 61 y.o. female.  HPI: Patient is 61 year old black female with past medical history significant for coronary artery disease status post PTCA stenting to left circumflex in 2008 and PTCA stenting to RCA in 2009 hypertension non-insulin-dependent diabetes mellitus hypercholesteremia GERD history of tobacco abuse COPD bronchial asthma history of colonic polyp chronic anemia she was transferred from health service because of progressive increasing shortness of breath since Saturday associated with cough and yellowish phlegm and wheezing. Patient denies any fever but complains of chills patient also complains of localized left-sided chest pain radiating to left shoulder without associated nausea vomiting or diaphoresis. Patient denies any exertional chest pain. Denies any palpitation lightheadedness or syncope. Patient recent leak was discharged from the hospital approximately one month ago her because of chest pain subsequently had left cath and was noted to have patent stents and left circumflex and RCA. Patient denies any history of recent exertional angina. Denies any prolonged immobilization or leg pain or swelling. Patient received breathing treatment and was started on IV Avelox with improvement in her symptoms patient presently denies any chest pains and states this feeling better. Patient also was noted to have serial anemia with hemoglobin of 7.8 which is drop of about 4 g since her hospital discharge last month. Patient denies any black tarry stools denies any bright red blood per rectum denies any hematuria . Patient denies any hematemesis or hemoptysis.  Past Medical History  Diagnosis Date  . History of colonic polyps   . GERD (gastroesophageal reflux disease)   . Hyperlipidemia   . Diabetes mellitus without mention of complication   . Hypertension   . Myocardial infarction   . Coronary artery disease     . Fatty liver disease, nonalcoholic   . Colon polyp   . Anemia   . Angina   . Shortness of breath   . Asthma     Past Surgical History  Procedure Date  . Right colectomy     After colonic perforation  . Tubal ligation   . Coronary angioplasty with stent placement     Family History  Problem Relation Age of Onset  . Heart disease Mother   . Diabetes Mother     Sister also  . Colon cancer Neg Hx     Social History:  reports that she has been smoking.  She has never used smokeless tobacco. She reports that she does not drink alcohol or use illicit drugs.  Allergies: No Known Allergies  Medications: I have reviewed the patient's current medications.  Results for orders placed during the hospital encounter of 03/19/11 (from the past 48 hour(s))  CBC     Status: Abnormal   Collection Time   03/19/11  4:19 PM      Component Value Range Comment   WBC 9.4  4.0 - 10.5 (K/uL)    RBC 4.26  3.87 - 5.11 (MIL/uL)    Hemoglobin 7.8 (*) 12.0 - 15.0 (g/dL)    HCT 36.9  36.0 - 46.0 (%)    MCV 86.6  78.0 - 100.0 (fL)    MCH 18.3 (*) 26.0 - 34.0 (pg)    MCHC 21.1 (*) 30.0 - 36.0 (g/dL)    RDW 17.2 (*) 11.5 - 15.5 (%)    Platelets 192  150 - 400 (K/uL)   DIFFERENTIAL     Status: Normal   Collection Time   03/19/11  4:19 PM  Component Value Range Comment   Neutrophils Relative 59  43 - 77 (%)    Neutro Abs 5.6  1.7 - 7.7 (K/uL)    Lymphocytes Relative 27  12 - 46 (%)    Lymphs Abs 2.5  0.7 - 4.0 (K/uL)    Monocytes Relative 10  3 - 12 (%)    Monocytes Absolute 1.0  0.1 - 1.0 (K/uL)    Eosinophils Relative 3  0 - 5 (%)    Eosinophils Absolute 0.3  0.0 - 0.7 (K/uL)    Basophils Relative 0  0 - 1 (%)    Basophils Absolute 0.0  0.0 - 0.1 (K/uL)   BASIC METABOLIC PANEL     Status: Abnormal   Collection Time   03/19/11  4:19 PM      Component Value Range Comment   Sodium 142  135 - 145 (mEq/L)    Potassium 3.4 (*) 3.5 - 5.1 (mEq/L)    Chloride 104  96 - 112 (mEq/L)    CO2 24   19 - 32 (mEq/L)    Glucose, Bld 103 (*) 70 - 99 (mg/dL)    BUN 15  6 - 23 (mg/dL)    Creatinine, Ser 0.89  0.50 - 1.10 (mg/dL)    Calcium 10.2  8.4 - 10.5 (mg/dL)    GFR calc non Af Amer 68 (*) >90 (mL/min)    GFR calc Af Amer 78 (*) >90 (mL/min)   TROPONIN I     Status: Normal   Collection Time   03/19/11  4:19 PM      Component Value Range Comment   Troponin I <0.30  <0.30 (ng/mL)   PRO B NATRIURETIC PEPTIDE     Status: Normal   Collection Time   03/19/11  4:19 PM      Component Value Range Comment   Pro B Natriuretic peptide (BNP) 23.5  0 - 125 (pg/mL)   TYPE AND SCREEN     Status: Normal   Collection Time   03/19/11  6:20 PM      Component Value Range Comment   ABO/RH(D) AB POS      Antibody Screen NEG      Sample Expiration 03/22/2011     POCT I-STAT 3, BLOOD GAS (G3+)     Status: Abnormal   Collection Time   03/19/11  7:21 PM      Component Value Range Comment   pH, Arterial 7.373  7.350 - 7.400     pCO2 arterial 38.4  35.0 - 45.0 (mmHg)    pO2, Arterial 66.0 (*) 80.0 - 100.0 (mmHg)    Bicarbonate 22.3  20.0 - 24.0 (mEq/L)    TCO2 23  0 - 100 (mmol/L)    O2 Saturation 92.0      Acid-base deficit 3.0 (*) 0.0 - 2.0 (mmol/L)    Patient temperature 98.6 F      Collection site RADIAL, ALLEN'S TEST ACCEPTABLE      Drawn by Operator      Sample type ARTERIAL     OCCULT BLOOD, POC DEVICE     Status: Normal   Collection Time   03/19/11  7:33 PM      Component Value Range Comment   Fecal Occult Bld NEGATIVE     GLUCOSE, CAPILLARY     Status: Abnormal   Collection Time   03/19/11  9:22 PM      Component Value Range Comment   Glucose-Capillary 233 (*) 70 - 99 (mg/dL)  Comment 1 Notify RN       Dg Chest Port 1 View  03/19/2011  *RADIOLOGY REPORT*  Clinical Data: Chest tightness, weakness and shortness of breath.  PORTABLE CHEST - 1 VIEW  Comparison: Chest x-ray 02/18/2011.  Findings: Lung volumes are low.  Linear opacity at the base of the left hemithorax likely reflects  subsegmental atelectasis. There is also blunting of the left costophrenic sulcus, which may suggest a small left-sided pleural effusion (however, this is favored to be projectional). No definite consolidative airspace disease.  No edema.  Heart size is upper limits of normal.  Mediastinal contours are unremarkable.  Atherosclerotic calcifications in the thoracic aorta.  IMPRESSION:  1. Low lung volumes with probable subsegmental atelectasis in the left lower lobe.  A trace left-sided pleural effusion is difficult to exclude (but is not favored). 2.  Atherosclerosis.  Original Report Authenticated By: Etheleen Mayhew, M.D.    Review of Systems  Constitutional: Positive for chills. Negative for fever and weight loss.  Eyes: Negative for blurred vision and double vision.  Respiratory: Positive for cough, shortness of breath and wheezing. Negative for hemoptysis.   Cardiovascular: Positive for chest pain. Negative for palpitations, orthopnea, claudication and leg swelling.  Gastrointestinal: Negative for nausea, vomiting, abdominal pain, diarrhea, blood in stool and melena.  Skin: Negative for rash.  Neurological: Negative for dizziness, tremors, seizures and headaches.   Blood pressure 72/38, pulse 100, temperature 98.6 F (37 C), temperature source Oral, resp. rate 32, SpO2 99.00%. Physical Exam  Constitutional: She is oriented to person, place, and time. She appears well-developed.  HENT:  Head: Normocephalic and atraumatic.  Eyes: Conjunctivae are normal. Pupils are equal, round, and reactive to light. Left eye exhibits no discharge. No scleral icterus.  Neck: Neck supple. No JVD present. No tracheal deviation present. No thyromegaly present.  Cardiovascular: Normal rate, regular rhythm and normal heart sounds.        Soft systolic murmur noted no S3 gallop  Respiratory: She has no rales.        Decreased breath sounds at bases with expiratory wheezing  GI: Soft. Bowel sounds are normal.  She exhibits distension. There is no tenderness. There is no rebound and no guarding.  Musculoskeletal: She exhibits no edema and no tenderness.  Lymphadenopathy:    She has no cervical adenopathy.  Neurological: She is alert and oriented to person, place, and time.    Assessment/Plan: Atypical chest pain doubt cardiac in origin patient recently had left cardiac cath and was noted to have patent stents approximately one month ago  Coronary artery disease status post PCI to left circumflex and RCA in past stable Exacerbation of bronchial asthma Hypertension Non-insulin-dependent diabetes mellitus Hypercholesteremia Acute on chronic anemia rule out GI loss History of tobacco abuse History of colonic polyps/perforation status post partial colectomy GERD Plan Agree with present management Will check 3 sets of cardiac enzymes and EKG Anemia workup per primary team  [facility] N 03/19/2011, 9:30 PM

## 2011-03-19 NOTE — ED Notes (Signed)
Patient alert and orientedx3, speaking in short, complete sentences, expiratory wheezing noted, oxygen saturation 95% on room air.

## 2011-03-19 NOTE — H&P (Signed)
[name] is an 61 y.o. female.   PCP - [facility] Cardiologist - Dr.Harwani. Chief Complaint: Shortness of breath. HPI: 61 year-old female with history of CAD status post stenting who has had a cardiac catheter 2 weeks ago and showed a patent stents, history of COPD versus asthma with history of severe smoking quit a year and a half ago, hypertension,diabetes mellitus type 2 presented here because of increasing shortness of breath over the last 2-3 days. Patient had gone to her PCP today at [facility] and was instructed to come to the ER. In the ER patient was found to be short of breath with wheezing chest x-ray did not show anything acute. Patient was given at least 2 treatments with albuterol lasting more than an hour. Patient is still short of breath and will  be admitted for further management. Patient in addition has been having off and on chest pain. Patient had some chest pain yesterday while she was in the church which got better after she took nitroglycerin sublingual. Again while in the ER patient has mild chest pain which has resolved this time. The chest pain is left-sided and it is fall from the second and third intercostal space nonradiating. Patient denies any nausea vomiting diaphoresis abdominal pain dysuria discharges or diarrhea. Patient's ABG shows pH of 7.37 PCO2 of 38.4 and PO2 of 66 on 4 L.  Past Medical History  Diagnosis Date  . History of colonic polyps   . GERD (gastroesophageal reflux disease)   . Hyperlipidemia   . Diabetes mellitus without mention of complication   . Hypertension   . Myocardial infarction   . Coronary artery disease   . Fatty liver disease, nonalcoholic   . Colon polyp   . Anemia   . Angina   . Shortness of breath   . Asthma     Past Surgical History  Procedure Date  . Right colectomy     After colonic perforation  . Tubal ligation   . Coronary angioplasty with stent placement     Family History  Problem Relation Age of Onset  .  Heart disease Mother   . Diabetes Mother     Sister also  . Colon cancer Neg Hx    Social History:  reports that she has been smoking.  She has never used smokeless tobacco. She reports that she does not drink alcohol or use illicit drugs.  Allergies: No Known Allergies  Medications Prior to Admission  Medication Dose Route Frequency Provider Last Rate Last Dose  . albuterol (PROVENTIL) (5 MG/ML) 0.5% nebulizer solution 10 mg  10 mg Nebulization Once Charlena Cross, MD   10 mg at 03/19/11 1922  . albuterol (PROVENTIL) (5 MG/ML) 0.5% nebulizer solution 15 mg  15 mg Nebulization Once Merideth Abbey, MD   15 mg at 03/19/11 1800  . albuterol (PROVENTIL,VENTOLIN) solution continuous neb  15 mg/hr Nebulization Continuous Charlena Cross, MD 3 mL/hr at 03/19/11 1640 15 mg/hr at 03/19/11 1640  . ipratropium (ATROVENT) nebulizer solution 0.5 mg  0.5 mg Nebulization Once Merideth Abbey, MD   0.5 mg at 03/19/11 1639  . methylPREDNISolone sodium succinate (SOLU-MEDROL) 125 MG injection 125 mg  125 mg Intravenous Once Merideth Abbey, MD   125 mg at 03/19/11 1628  . moxifloxacin (AVELOX) IVPB 400 mg  400 mg Intravenous Once Merideth Abbey, MD   400 mg at 03/19/11 2005  . nitroGLYCERIN (NITROSTAT) SL tablet 0.4 mg  0.4 mg Sublingual  Q5 min PRN Merideth Abbey, MD   0.4 mg at 03/19/11 2026  . DISCONTD: albuterol (PROVENTIL) (5 MG/ML) 0.5% nebulizer solution 20 mg  20 mg Nebulization Once Merideth Abbey, MD       Medications Prior to Admission  Medication Sig Dispense Refill  . albuterol (PROVENTIL HFA;VENTOLIN HFA) 108 (90 BASE) MCG/ACT inhaler Inhale 2 puffs into the lungs every 6 (six) hours as needed. For shortness of breath      . amLODipine (NORVASC) 5 MG tablet Take 5 mg by mouth daily.        Marland Kitchen aspirin 81 MG tablet Take 81 mg by mouth daily.        Marland Kitchen atorvastatin (LIPITOR) 20 MG tablet Take 20 mg by mouth daily.      . famotidine (PEPCID) 20 MG tablet Take 1 tablet (20 mg total) by mouth 2  (two) times daily.  60 tablet  3  . metFORMIN (GLUCOPHAGE) 500 MG tablet Take 500 mg by mouth 2 (two) times daily with a meal.      . metoprolol succinate (TOPROL-XL) 50 MG 24 hr tablet Take 50 mg by mouth daily. Take with or immediately following a meal.      . nitroGLYCERIN (NITROSTAT) 0.4 MG SL tablet Place 0.4 mg under the tongue every 5 (five) minutes as needed. As needed         Results for orders placed during the hospital encounter of 03/19/11 (from the past 48 hour(s))  CBC     Status: Abnormal   Collection Time   03/19/11  4:19 PM      Component Value Range Comment   WBC 9.4  4.0 - 10.5 (K/uL)    RBC 4.26  3.87 - 5.11 (MIL/uL)    Hemoglobin 7.8 (*) 12.0 - 15.0 (g/dL)    HCT 36.9  36.0 - 46.0 (%)    MCV 86.6  78.0 - 100.0 (fL)    MCH 18.3 (*) 26.0 - 34.0 (pg)    MCHC 21.1 (*) 30.0 - 36.0 (g/dL)    RDW 17.2 (*) 11.5 - 15.5 (%)    Platelets 192  150 - 400 (K/uL)   DIFFERENTIAL     Status: Normal   Collection Time   03/19/11  4:19 PM      Component Value Range Comment   Neutrophils Relative 59  43 - 77 (%)    Neutro Abs 5.6  1.7 - 7.7 (K/uL)    Lymphocytes Relative 27  12 - 46 (%)    Lymphs Abs 2.5  0.7 - 4.0 (K/uL)    Monocytes Relative 10  3 - 12 (%)    Monocytes Absolute 1.0  0.1 - 1.0 (K/uL)    Eosinophils Relative 3  0 - 5 (%)    Eosinophils Absolute 0.3  0.0 - 0.7 (K/uL)    Basophils Relative 0  0 - 1 (%)    Basophils Absolute 0.0  0.0 - 0.1 (K/uL)   BASIC METABOLIC PANEL     Status: Abnormal   Collection Time   03/19/11  4:19 PM      Component Value Range Comment   Sodium 142  135 - 145 (mEq/L)    Potassium 3.4 (*) 3.5 - 5.1 (mEq/L)    Chloride 104  96 - 112 (mEq/L)    CO2 24  19 - 32 (mEq/L)    Glucose, Bld 103 (*) 70 - 99 (mg/dL)    BUN 15  6 - 23 (mg/dL)  Creatinine, Ser 0.89  0.50 - 1.10 (mg/dL)    Calcium 10.2  8.4 - 10.5 (mg/dL)    GFR calc non Af Amer 68 (*) >90 (mL/min)    GFR calc Af Amer 78 (*) >90 (mL/min)   TROPONIN I     Status: Normal    Collection Time   03/19/11  4:19 PM      Component Value Range Comment   Troponin I <0.30  <0.30 (ng/mL)   PRO B NATRIURETIC PEPTIDE     Status: Normal   Collection Time   03/19/11  4:19 PM      Component Value Range Comment   Pro B Natriuretic peptide (BNP) 23.5  0 - 125 (pg/mL)   TYPE AND SCREEN     Status: Normal   Collection Time   03/19/11  6:20 PM      Component Value Range Comment   ABO/RH(D) AB POS      Antibody Screen NEG      Sample Expiration 03/22/2011     POCT I-STAT 3, BLOOD GAS (G3+)     Status: Abnormal   Collection Time   03/19/11  7:21 PM      Component Value Range Comment   pH, Arterial 7.373  7.350 - 7.400     pCO2 arterial 38.4  35.0 - 45.0 (mmHg)    pO2, Arterial 66.0 (*) 80.0 - 100.0 (mmHg)    Bicarbonate 22.3  20.0 - 24.0 (mEq/L)    TCO2 23  0 - 100 (mmol/L)    O2 Saturation 92.0      Acid-base deficit 3.0 (*) 0.0 - 2.0 (mmol/L)    Patient temperature 98.6 F      Collection site RADIAL, ALLEN'S TEST ACCEPTABLE      Drawn by Operator      Sample type ARTERIAL     OCCULT BLOOD, POC DEVICE     Status: Normal   Collection Time   03/19/11  7:33 PM      Component Value Range Comment   Fecal Occult Bld NEGATIVE      Dg Chest Port 1 View  03/19/2011  *RADIOLOGY REPORT*  Clinical Data: Chest tightness, weakness and shortness of breath.  PORTABLE CHEST - 1 VIEW  Comparison: Chest x-ray 02/18/2011.  Findings: Lung volumes are low.  Linear opacity at the base of the left hemithorax likely reflects subsegmental atelectasis. There is also blunting of the left costophrenic sulcus, which may suggest a small left-sided pleural effusion (however, this is favored to be projectional). No definite consolidative airspace disease.  No edema.  Heart size is upper limits of normal.  Mediastinal contours are unremarkable.  Atherosclerotic calcifications in the thoracic aorta.  IMPRESSION:  1. Low lung volumes with probable subsegmental atelectasis in the left lower lobe.  A trace  left-sided pleural effusion is difficult to exclude (but is not favored). 2.  Atherosclerosis.  Original Report Authenticated By: Etheleen Mayhew, M.D.    Review of Systems  Constitutional: Negative.   HENT: Negative.   Eyes: Negative.   Respiratory: Positive for sputum production, shortness of breath and wheezing.   Cardiovascular: Positive for chest pain.  Gastrointestinal: Negative.   Genitourinary: Negative.   Musculoskeletal: Negative.   Skin: Negative.   Neurological: Negative.   Endo/Heme/Allergies: Negative.   Psychiatric/Behavioral: Negative.     Blood pressure 105/69, pulse 103, temperature 98.6 F (37 C), temperature source Oral, resp. rate 23, SpO2 97.00%. Physical Exam  Constitutional: She is oriented to person, place, and  time. She appears well-developed and well-nourished. No distress.  HENT:  Head: Normocephalic and atraumatic.  Right Ear: External ear normal.  Left Ear: External ear normal.  Nose: Nose normal.  Mouth/Throat: Oropharynx is clear and moist. No oropharyngeal exudate.  Eyes: Conjunctivae and EOM are normal. Pupils are equal, round, and reactive to light. Right eye exhibits no discharge. Left eye exhibits no discharge. No scleral icterus.  Neck: Normal range of motion. Neck supple.  Cardiovascular: Normal rate and regular rhythm.   Respiratory: Effort normal. She has wheezes.       Mild respiratory distress.  GI: Soft. Bowel sounds are normal. She exhibits no distension. There is no tenderness. There is no rebound.  Musculoskeletal: Normal range of motion. She exhibits no edema and no tenderness.  Neurological: She is alert and oriented to person, place, and time.       Moves upper and lower extremities.  Skin: Skin is warm and dry. No rash noted. She is not diaphoretic. No erythema.  Psychiatric: Her behavior is normal.     Assessment/Plan  #1. Acute respiratory failure probably from COPD versus asthma exacerbation - patient will be continued  on nebulizer and IV steroids with Pulmicort and antibiotics. Closely observe in step down for any worsening as patient at this time is not able to complete sentences. #2. Chest pain with history of CAD status post stenting and recent cardiac catheter showing patent stents - have consulted her cardiologist Dr.Harwani. We will continue her present medications including aspirin. #3. Anemia normocytic and normochromic with history of colon polyps stool for occult blood is negative - type and screened packed red blood cells. Recheck CBC in a.m. #4. Diabetes mellitus type 2  - place on sliding scale insulin. #5. History of hypertension and hyperlipidemia  - continue present medications.  CODE STATUS  -full code.   Vadis Slabach N. 03/19/2011, 9:01 PM

## 2011-03-19 NOTE — ED Notes (Signed)
Assumed care of pt.  No distress noted.  Pt sitting up in bed, remains on [place] for O2.  No respiratory distress noted.

## 2011-03-19 NOTE — ED Notes (Signed)
RN has a pt coding at this time on 3300.  Will call when finished.

## 2011-03-19 NOTE — ED Notes (Signed)
Pt reports having CP, state that it is (L) sided radiating to (L) shoulder and arm.  No acute distress noted.  Expiratory wheezing noted.  Breathing treatment continues.

## 2011-03-19 NOTE — ED Notes (Signed)
Pt. Is very sob, and wheezing, unable to speak full sentences.  Pt. Denies any chest pain, cough noted

## 2011-03-19 NOTE — ED Notes (Signed)
Assist with rectal exam by EDP.

## 2011-03-19 NOTE — ED Provider Notes (Signed)
History     CSN: [id number]  Arrival date & time 03/19/11  1543   First MD Initiated Contact with Patient 03/19/11 1615      Chief Complaint  Patient presents with  . Shortness of Breath    (Consider location/radiation/quality/duration/timing/severity/associated sxs/prior treatment) Patient is a 61 y.o. female presenting with shortness of breath. The history is provided by the patient.  Shortness of Breath  The current episode started 2 days ago. The problem occurs continuously. The problem has been gradually worsening. The problem is severe. The symptoms are relieved by nothing. The symptoms are aggravated by activity. Associated symptoms include chest pressure, orthopnea, cough (non-productive), shortness of breath and wheezing. Pertinent negatives include no fever, no rhinorrhea and no sore throat. Past medical history comments: COPD, CHF, CAD. She has been less active. There were no sick contacts. She has received no recent medical care.    Past Medical History  Diagnosis Date  . History of colonic polyps   . GERD (gastroesophageal reflux disease)   . Hyperlipidemia   . Diabetes mellitus without mention of complication   . Hypertension   . Myocardial infarction   . Coronary artery disease   . Fatty liver disease, nonalcoholic   . Colon polyp   . Anemia   . Angina   . Shortness of breath     Past Surgical History  Procedure Date  . Right colectomy     After colonic perforation  . Tubal ligation   . Coronary angioplasty with stent placement     Family History  Problem Relation Age of Onset  . Heart disease Mother   . Diabetes Mother     Sister also  . Colon cancer Neg Hx     History  Substance Use Topics  . Smoking status: Current Everyday Smoker -- 0.1 packs/day for 40 years  . Smokeless tobacco: Never Used  . Alcohol Use: No    OB History    Grav Para Term Preterm Abortions TAB SAB Ect Mult Living                  Review of Systems    Constitutional: Negative for fever, chills, activity change, appetite change and fatigue.  HENT: Negative for congestion, sore throat, rhinorrhea, neck pain and neck stiffness.   Eyes: Negative for photophobia, redness and visual disturbance.  Respiratory: Positive for cough (non-productive), shortness of breath and wheezing.   Cardiovascular: Positive for orthopnea and leg swelling (mild). Negative for palpitations.  Gastrointestinal: Negative for nausea, vomiting, abdominal pain, diarrhea, constipation and blood in stool.  Genitourinary: Negative for dysuria, urgency, hematuria and flank pain.  Musculoskeletal: Negative for back pain.  Skin: Negative for rash and wound.  Neurological: Negative for dizziness, seizures, facial asymmetry, speech difficulty, weakness, light-headedness, numbness and headaches.  Psychiatric/Behavioral: Negative for confusion.  All other systems reviewed and are negative.    Allergies  Review of patient's allergies indicates no known allergies.  Home Medications   Current Outpatient Rx  Name Route Sig Dispense Refill  . ALBUTEROL SULFATE HFA 108 (90 BASE) MCG/ACT IN AERS Inhalation Inhale 2 puffs into the lungs every 6 (six) hours as needed. For shortness of breath    . AMLODIPINE BESYLATE 5 MG PO TABS Oral Take 5 mg by mouth daily.      . AMOXICILLIN-POT CLAVULANATE 875-125 MG PO TABS Oral Take 1 tablet by mouth 2 (two) times daily. For 7 days. Started on 1/25. Still has medication left.    Marland Kitchen  ASPIRIN 81 MG PO TABS Oral Take 81 mg by mouth daily.      . ATORVASTATIN CALCIUM 20 MG PO TABS Oral Take 20 mg by mouth daily.    Marland Kitchen FAMOTIDINE 20 MG PO TABS Oral Take 1 tablet (20 mg total) by mouth 2 (two) times daily. 60 tablet 3  . IRBESARTAN 150 MG PO TABS Oral Take 150 mg by mouth daily.    Marland Kitchen METFORMIN HCL 500 MG PO TABS Oral Take 500 mg by mouth 2 (two) times daily with a meal.    . METOPROLOL SUCCINATE ER 50 MG PO TB24 Oral Take 50 mg by mouth daily. Take  with or immediately following a meal.    . NITROGLYCERIN 0.4 MG SL SUBL Sublingual Place 0.4 mg under the tongue every 5 (five) minutes as needed. As needed     . OMEPRAZOLE 20 MG PO CPDR Oral Take 20 mg by mouth daily. 30 minutes before breakfast    . ONDANSETRON HCL 4 MG PO TABS Oral Take 4 mg by mouth daily as needed. As needed for nausea      BP 166/77  Pulse 90  Temp(Src) 98.2 F (36.8 C) (Oral)  Resp 22  SpO2 98%  Physical Exam  Nursing note and vitals reviewed. Constitutional: She is oriented to person, place, and time. She appears well-developed and well-nourished.  Non-toxic appearance. She appears distressed (respiratory).       Vital signs reviewed. Hypertension 166/77. HR 90.   HENT:  Head: Normocephalic and atraumatic.  Mouth/Throat: Oropharynx is clear and moist.  Eyes: Conjunctivae and EOM are normal. Pupils are equal, round, and reactive to light. No scleral icterus.  Neck: Normal range of motion. Neck supple. JVD: 2cm.  Cardiovascular: Normal rate, regular rhythm, normal heart sounds and intact distal pulses.   No murmur heard. Pulmonary/Chest: Accessory muscle usage present. Tachypnea noted. She is in respiratory distress. She has wheezes.       Minimal air movement. Diffuse expiratory wheezing.   Abdominal: Soft. Bowel sounds are normal. She exhibits no distension. There is no tenderness. There is no rebound and no guarding.  Genitourinary: Rectum normal. Rectal exam shows no external hemorrhoid, no fissure, no mass, no tenderness and anal tone normal. Guaiac negative stool.       Rectal exam performed with CNA chaperone Anderson Malta.   Musculoskeletal: Normal range of motion.  Neurological: She is alert and oriented to person, place, and time. She has normal strength. No cranial nerve deficit. GCS eye subscore is 4. GCS verbal subscore is 5. GCS motor subscore is 6.  Skin: Skin is warm and dry. No rash noted. She is not diaphoretic.  Psychiatric: She has a normal mood  and affect.    ED Course  Procedures (including critical care time)   Date: 03/19/2011  Rate: 75  Rhythm: normal sinus rhythm  QRS Axis: left  Intervals: normal  ST/T Wave abnormalities: nonspecific ST changes  Conduction Disutrbances:none  Narrative Interpretation: T wave flattening in precordial leads with poor R wave progression.   Old EKG Reviewed: changes noted compared to EKG 02/19/11    Labs Reviewed  CBC - Abnormal; Notable for the following:    Hemoglobin 7.8 (*)    MCH 18.3 (*)    MCHC 21.1 (*)    RDW 17.2 (*)    All other components within normal limits  BASIC METABOLIC PANEL - Abnormal; Notable for the following:    Potassium 3.4 (*)    Glucose, Bld 103 (*)  GFR calc non Af Amer 68 (*)    GFR calc Af Amer 78 (*)    All other components within normal limits  POCT I-STAT 3, BLOOD GAS (G3+) - Abnormal; Notable for the following:    pO2, Arterial 66.0 (*)    Acid-base deficit 3.0 (*)    All other components within normal limits  DIFFERENTIAL  TROPONIN I  PRO B NATRIURETIC PEPTIDE  TYPE AND SCREEN  OCCULT BLOOD, POC DEVICE  BLOOD GAS, ARTERIAL   Dg Chest Port 1 View  03/19/2011  *RADIOLOGY REPORT*  Clinical Data: Chest tightness, weakness and shortness of breath.  PORTABLE CHEST - 1 VIEW  Comparison: Chest x-ray 02/18/2011.  Findings: Lung volumes are low.  Linear opacity at the base of the left hemithorax likely reflects subsegmental atelectasis. There is also blunting of the left costophrenic sulcus, which may suggest a small left-sided pleural effusion (however, this is favored to be projectional). No definite consolidative airspace disease.  No edema.  Heart size is upper limits of normal.  Mediastinal contours are unremarkable.  Atherosclerotic calcifications in the thoracic aorta.  IMPRESSION:  1. Low lung volumes with probable subsegmental atelectasis in the left lower lobe.  A trace left-sided pleural effusion is difficult to exclude (but is not favored). 2.   Atherosclerosis.  Original Report Authenticated By: Etheleen Mayhew, M.D.     No diagnosis found.    MDM  61yo AAF with PMH significant for CHF, COPD, HTN, DM, and CAD who presents to the ED due to 2d hx dyspnea and wheezing. Non-productive cough. Has had tightness in her chest. Pt in mild-mod resp distress with increased WOB on arrival to the ED speaking in short phrases only but mentating appropriately. Sats mid-upper 90s. Giving solumedrol and placing on cont albuterol with atrovent. Getting xray and labs. Concern for COPD exacerbation. EKG w/o STE. Will r/o NSTEMI. Will get portable CXR to r/o PNA.   No PNA on CXR. Will cover with avelox. Giving 2nd hour of albuterol neb. Pt anemic will type and screen. No hx melena or hematochezia. Heme neg brown stool on exam.   Pt reassessed still tachypneic and wheezing. 3rd neb ordered. ABG ok.   Pt on 3rd neb. Still tachypneic. Int med consulted. Will place her in step down unit. She is still having tightness in her chest. Trop neg. Repeat EKG unchanged. Giving nitro.       Merideth Abbey, MD 03/19/11 2029

## 2011-03-20 LAB — CBC
MCV: 86.2 fL (ref 78.0–100.0)
Platelets: 259 10*3/uL (ref 150–400)
RBC: 4.21 MIL/uL (ref 3.87–5.11)
RDW: 17.3 % — ABNORMAL HIGH (ref 11.5–15.5)
WBC: 10.1 10*3/uL (ref 4.0–10.5)

## 2011-03-20 LAB — COMPREHENSIVE METABOLIC PANEL
Albumin: 3.6 g/dL (ref 3.5–5.2)
BUN: 18 mg/dL (ref 6–23)
Chloride: 102 mEq/L (ref 96–112)
Creatinine, Ser: 0.99 mg/dL (ref 0.50–1.10)
GFR calc Af Amer: 69 mL/min — ABNORMAL LOW (ref >90)
Glucose, Bld: 317 mg/dL — ABNORMAL HIGH (ref 70–99)
Total Bilirubin: 0.3 mg/dL (ref 0.3–1.2)

## 2011-03-20 LAB — HEMOGLOBIN A1C
Hgb A1c MFr Bld: 7.2 % — ABNORMAL HIGH (ref <5.7)
Mean Plasma Glucose: 160 mg/dL — ABNORMAL HIGH (ref <117)

## 2011-03-20 LAB — GLUCOSE, CAPILLARY
Glucose-Capillary: 299 mg/dL — ABNORMAL HIGH (ref 70–99)
Glucose-Capillary: 296 mg/dL — ABNORMAL HIGH (ref 70–99)

## 2011-03-20 LAB — TSH: TSH: 0.207 u[IU]/mL — ABNORMAL LOW (ref 0.350–4.500)

## 2011-03-20 LAB — BLOOD GAS, ARTERIAL
FIO2: 1 %
Patient temperature: 98.6
pCO2 arterial: 36 mmHg (ref 35.0–45.0)
pH, Arterial: 7.339 — ABNORMAL LOW (ref 7.350–7.400)

## 2011-03-20 LAB — CARDIAC PANEL(CRET KIN+CKTOT+MB+TROPI)
Troponin I: <0.3 ng/mL (ref <0.30)
Troponin I: <0.3 ng/mL (ref <0.30)

## 2011-03-20 LAB — MAGNESIUM: Magnesium: 1.8 mg/dL (ref 1.5–2.5)

## 2011-03-20 LAB — MRSA PCR SCREENING: MRSA by PCR: NEGATIVE

## 2011-03-20 MED ORDER — IPRATROPIUM BROMIDE 0.02 % IN SOLN
0.5000 mg | Freq: Four times a day (QID) | RESPIRATORY_TRACT | Status: DC
  Administered 2011-03-20 – 2011-03-21 (×4): 0.5 mg via RESPIRATORY_TRACT
  Filled 2011-03-20 (×4): qty 2.5

## 2011-03-20 MED ORDER — INSULIN GLARGINE 100 UNIT/ML ~~LOC~~ SOLN
10.0000 [IU] | Freq: Every day | SUBCUTANEOUS | Status: DC
  Administered 2011-03-20: 10 [IU] via SUBCUTANEOUS

## 2011-03-20 MED ORDER — HYDROCOD POLST-CHLORPHEN POLST 10-8 MG/5ML PO LQCR
5.0000 mL | Freq: Two times a day (BID) | ORAL | Status: DC | PRN
  Administered 2011-03-20 – 2011-03-24 (×5): 5 mL via ORAL
  Filled 2011-03-20 (×6): qty 5

## 2011-03-20 MED ORDER — LORAZEPAM 0.5 MG PO TABS
0.5000 mg | ORAL_TABLET | Freq: Four times a day (QID) | ORAL | Status: DC | PRN
Start: 2011-03-20 — End: 2011-03-27
  Administered 2011-03-20 – 2011-03-27 (×5): 0.5 mg via ORAL
  Filled 2011-03-20 (×5): qty 1

## 2011-03-20 MED ORDER — MOXIFLOXACIN HCL IN NACL 400 MG/250ML IV SOLN: 400.0000 mg | INTRAVENOUS | Status: DC

## 2011-03-20 NOTE — Progress Notes (Signed)
Patient remains with increased respiratory effort and elevated RR. Dominique Fantasia, NP notified and she recommended increasing O2 via mask. Patient placed on 100% NRB for now. Will continue to monitor and decrease O2 with decreased respiratory effort.

## 2011-03-20 NOTE — Progress Notes (Signed)
   CARE MANAGEMENT NOTE 03/20/2011  Patient:  [name], [name]   Account Number:  [id number]  Date Initiated:  03/20/2011  Documentation initiated by:  Lars Pinks  Subjective/Objective Assessment:   PT WAS ADMITTED FOR SOB     Action/Plan:   PROGRESSION OF CARE AND DISCHARGE PLANNING   Anticipated DC Date:  03/23/2011   Anticipated DC Plan:  [place]  CM consult      Choice offered to / List presented to:             Status of service:  In process, will continue to follow Medicare Important Message given?   (If response is "NO", the following Medicare IM given date fields will be blank) Date Medicare IM given:   Date Additional Medicare IM given:    Discharge Disposition:    Per UR Regulation:  Reviewed for med. necessity/level of care/duration of stay  Comments:  UR COMPLETED.  Lars Pinks, RN, BSN 03/20/11 1221 PT WAS ADMITTED WITH THE ABOVE S/S, PTA PT WAS AT HOME WITH HH.  PT/OT EVAL NEEDED TO DETERMINE HH NEEDS, WILL F/U WHEN THIS IS ORDERED.

## 2011-03-20 NOTE — Progress Notes (Signed)
Dr. Wynelle Cleveland notified of ABG results and patient's respiratory status over nighttime. Dr. Wynelle Cleveland stated to placed patient on venti 50% and monitor. Will continue to follow.

## 2011-03-20 NOTE — Progress Notes (Signed)
Patient remains with increased respiratory effort on 4L [place]. Dominique Fantasia, NP notified. Discussed placing patient back on oxygen mask 100% NRB and checking ABG in an hour. Discussed concern for over oxygenating. Will continue to monitor and call with results.

## 2011-03-20 NOTE — Progress Notes (Signed)
Subjective: She contineus to be short of breath. She states she has been coughing up yellow sputum for a few days now. She is having pain in her left "ribs" under her breast when she coughs.   Objective: Blood pressure 113/55, pulse 72, temperature 98.1 F (36.7 C), temperature source Oral, resp. rate 20, height 4\' 11"  (1.499 m), weight 65.2 kg (143 lb 11.8 oz), SpO2 98.00%. Weight change:   Intake/Output Summary (Last 24 hours) at 03/20/11 2000 Last data filed at 03/20/11 1800  Gross per 24 hour  Intake    783 ml  Output   1100 ml  Net   -317 ml    Physical Exam: General appearance: alert, cooperative, moderate distress and anxious and almost tearful Head: Normocephalic, without obvious abnormality, atraumatic Throat: lips, mucosa, and tongue normal; teeth and gums normal Lungs: rhonchi bilaterally and wheezes bilaterally Heart: regular rate and rhythm, S1, S2 normal, no murmur, click, rub or gallop Abdomen: soft, non-tender; bowel sounds normal; no masses,  no organomegaly Extremities: extremities normal, atraumatic, no cyanosis or edema  Lab Results:  [facility] 03/20/11 0610 03/19/11 1619  NA 139 142  K 3.6 3.4*  CL 102 104  CO2 19 24  GLUCOSE 317* 103*  BUN 18 15  CREATININE 0.99 0.89  CALCIUM 9.6 10.2  MG 1.8 --  PHOS -- --    Basename 03/20/11 0610  AST 11  ALT 13  ALKPHOS 102  BILITOT 0.3  PROT 8.3  ALBUMIN 3.6   No results found for this basename: LIPASE:2,AMYLASE:2 in the last 72 hours  Basename 03/20/11 0610 03/19/11 1619  WBC 10.1 9.4  NEUTROABS -- 5.6  HGB 11.2* 7.8*  HCT 36.3 36.9  MCV 86.2 86.6  PLT 259 192    Basename 03/20/11 1310 03/20/11 0610 03/19/11 2237  CKTOTAL 69 64 83  CKMB 2.3 2.1 1.9  CKMBINDEX -- -- --  TROPONINI <0.30 <0.30 <0.30   No components found with this basename: POCBNP:3 No results found for this basename: DDIMER:2 in the last 72 hours  Basename 03/19/11 2238  HGBA1C 7.2*   No results found for this basename:  CHOL:2,HDL:2,LDLCALC:2,TRIG:2,CHOLHDL:2,LDLDIRECT:2 in the last 72 hours  Basename 03/20/11 0610  TSH 0.207*  T4TOTAL --  T3FREE --  THYROIDAB --   No results found for this basename: VITAMINB12:2,FOLATE:2,FERRITIN:2,TIBC:2,IRON:2,RETICCTPCT:2 in the last 72 hours  Micro Results: Recent Results (from the past 240 hour(s))  MRSA PCR SCREENING     Status: Normal   Collection Time   03/19/11 10:01 PM      Component Value Range Status Comment   MRSA by PCR NEGATIVE  NEGATIVE  Final     Studies/Results: Dg Chest Port 1 View  03/19/2011  *RADIOLOGY REPORT*  Clinical Data: Chest tightness, weakness and shortness of breath.  PORTABLE CHEST - 1 VIEW  Comparison: Chest x-ray 02/18/2011.  Findings: Lung volumes are low.  Linear opacity at the base of the left hemithorax likely reflects subsegmental atelectasis. There is also blunting of the left costophrenic sulcus, which may suggest a small left-sided pleural effusion (however, this is favored to be projectional). No definite consolidative airspace disease.  No edema.  Heart size is upper limits of normal.  Mediastinal contours are unremarkable.  Atherosclerotic calcifications in the thoracic aorta.  IMPRESSION:  1. Low lung volumes with probable subsegmental atelectasis in the left lower lobe.  A trace left-sided pleural effusion is difficult to exclude (but is not favored). 2.  Atherosclerosis.  Original Report Authenticated By: Etheleen Mayhew,  M.D.    Medications: Scheduled Meds:   . albuterol  2.5 mg Nebulization Q6H  . amLODipine  5 mg Oral Daily  . aspirin EC  325 mg Oral Daily  . budesonide  0.5 mg Nebulization BID  . famotidine  20 mg Oral BID  . insulin aspart  0-9 Units Subcutaneous TID WC  . insulin glargine  5 Units Subcutaneous Once  . ipratropium  0.5 mg Nebulization Q6H  . methylPREDNISolone (SOLU-MEDROL) injection  60 mg Intravenous Q6H  . metoprolol succinate  50 mg Oral Daily  . moxifloxacin  400 mg Intravenous Once    . moxifloxacin  400 mg Intravenous Q24H  . olmesartan  20 mg Oral Daily  . pantoprazole  40 mg Oral Q1200  . pantoprazole (PROTONIX) IV  40 mg Intravenous Q24H  . simvastatin  40 mg Oral Daily  . sodium chloride  3 mL Intravenous Q12H  . sodium chloride  3 mL Intravenous Q12H   Continuous Infusions:  PRN Meds:.acetaminophen, acetaminophen, albuterol, chlorpheniramine-HYDROcodone, LORazepam, morphine injection, nitroGLYCERIN, nitroGLYCERIN, ondansetron (ZOFRAN) IV, ondansetron  Assessment/Plan: Principal Problem:  *Acute respiratory failure/ Asthma exacerbation/acute bronchitis Continue steroids, Nebs - she received a dose of Avelox in the ER; Will order this daily for acute bronchitis.  I will add Ativan for her anxiety which is likely exacerbating her shortness of breath.   Active Problems: Chest pain- appreciate cardio f/u. Cardiac enzymes are negative.   HYPERLIPIDEMIA  HYPERTENSION- BP stable on current meds  CORONARY ARTERY DISEASEAnemia  Diabetes mellitus- sugars high due to steroids. Will treat with lantus and novolog for now.       LOS: 1 day   [facility] [id number] 03/20/2011, 8:00 PM

## 2011-03-20 NOTE — Progress Notes (Signed)
Inpatient Diabetes Program Recommendations  AACE/ADA: New Consensus Statement on Inpatient Glycemic Control (2009)  Target Ranges:  Prepandial:   less than 140 mg/dL      Peak postprandial:   less than 180 mg/dL (1-2 hours)      Critically ill patients:  140 - 180 mg/dL   Results for JHAVIA, REVOIR (MRN [id number]) as of 03/20/2011 14:46  Ref. Range 03/20/2011 08:38 03/20/2011 11:30  Glucose-Capillary Latest Range: 70-99 mg/dL 299 (H) 296 (H)    Inpatient Diabetes Program Recommendations Insulin - Basal: Please start Lantus 10 units QHS while pt on IV steroids. Correction (SSI): Please increase Novolog SSI to Moderate scale tid ac + HS.  Note: will follow. Wyn Quaker RN, MSN, CDE Diabetes Coordinator Inpatient Diabetes Program [phone]

## 2011-03-20 NOTE — Progress Notes (Signed)
Subjective:  Patient complains of left-sided pleuritic chest pain associated with shortness of breath and wheezing Denies any anginal chest pain denies any hemoptysis Denies any fever or chills states overall feels better  Objective:  Vital Signs in the last 24 hours: Temp:  [97.8 F (36.6 C)-98.6 F (37 C)] 98 F (36.7 C) (02/12 1238) Pulse Rate:  [75-104] 78  (02/12 1033) Resp:  [20-32] 20  (02/12 0715) BP: (72-144)/(38-69) 140/60 mmHg (02/12 1238) SpO2:  [97 %-100 %] 99 % (02/12 1459) FiO2 (%):  [6 %-100 %] 50 % (02/12 0715) Weight:  [65.2 kg (143 lb 11.8 oz)-65.3 kg (143 lb 15.4 oz)] 65.2 kg (143 lb 11.8 oz) (02/12 0420)  Intake/Output from previous day: 02/11 0701 - 02/12 0700 In: 3 [I.V.:3] Out: 500 [Urine:500] Intake/Output from this shift: Total I/O In: 480 [P.O.:480] Out: 600 [Urine:600]  Physical Exam: General appearance: alert and cooperative Neck: no carotid bruit, no JVD and supple, symmetrical, trachea midline Lungs: Bilateral expiratory wheezing noted Heart: regular rate and rhythm, S1, S2 normal and Soft systolic murmur noted no S3 gallop Abdomen: soft, non-tender; bowel sounds normal; no masses,  no organomegaly Extremities: extremities normal, atraumatic, no cyanosis or edema  Lab Results:  [facility] 03/20/11 0610 03/19/11 1619  WBC 10.1 9.4  HGB 11.2* 7.8*  PLT 259 192    Basename 03/20/11 0610 03/19/11 1619  NA 139 142  K 3.6 3.4*  CL 102 104  CO2 19 24  GLUCOSE 317* 103*  BUN 18 15  CREATININE 0.99 0.89    Basename 03/20/11 1310 03/20/11 0610  TROPONINI <0.30 <0.30   Hepatic Function Panel  Basename 03/20/11 0610  PROT 8.3  ALBUMIN 3.6  AST 11  ALT 13  ALKPHOS 102  BILITOT 0.3  BILIDIR --  IBILI --   No results found for this basename: CHOL in the last 72 hours No results found for this basename: PROTIME in the last 72 hours  Imaging: Imaging results have been reviewed and Dg Chest Port 1 View  03/19/2011  *RADIOLOGY REPORT*   Clinical Data: Chest tightness, weakness and shortness of breath.  PORTABLE CHEST - 1 VIEW  Comparison: Chest x-ray 02/18/2011.  Findings: Lung volumes are low.  Linear opacity at the base of the left hemithorax likely reflects subsegmental atelectasis. There is also blunting of the left costophrenic sulcus, which may suggest a small left-sided pleural effusion (however, this is favored to be projectional). No definite consolidative airspace disease.  No edema.  Heart size is upper limits of normal.  Mediastinal contours are unremarkable.  Atherosclerotic calcifications in the thoracic aorta.  IMPRESSION:  1. Low lung volumes with probable subsegmental atelectasis in the left lower lobe.  A trace left-sided pleural effusion is difficult to exclude (but is not favored). 2.  Atherosclerosis.  Original Report Authenticated By: Etheleen Mayhew, M.D.    Cardiac Studies:  Assessment/Plan:  Atypical chest pain MI ruled out Exacerbation of bronchial asthma CAD stable Hypertension Non-insulin-dependent diabetes mellitus Hypercholesteremia Status post acute on chronic anemia History of tobacco abuse History of colonic polyp GERD Plan Continue present management per primary team  LOS: 1 day    [name] 03/20/2011, 5:27 PM

## 2011-03-21 LAB — GLUCOSE, CAPILLARY
Glucose-Capillary: 235 mg/dL — ABNORMAL HIGH (ref 70–99)
Glucose-Capillary: 416 mg/dL — ABNORMAL HIGH (ref 70–99)

## 2011-03-21 MED ORDER — INSULIN GLARGINE 100 UNIT/ML ~~LOC~~ SOLN: 15.0000 [IU] | Freq: Every day | SUBCUTANEOUS | Status: DC

## 2011-03-21 MED ORDER — INSULIN ASPART 100 UNIT/ML ~~LOC~~ SOLN
6.0000 [IU] | Freq: Three times a day (TID) | SUBCUTANEOUS | Status: DC
  Administered 2011-03-22 – 2011-03-27 (×17): 6 [IU] via SUBCUTANEOUS

## 2011-03-21 MED ORDER — ALBUTEROL SULFATE (5 MG/ML) 0.5% IN NEBU
2.5000 mg | INHALATION_SOLUTION | Freq: Four times a day (QID) | RESPIRATORY_TRACT | Status: DC
  Administered 2011-03-21 – 2011-03-22 (×2): 2.5 mg via RESPIRATORY_TRACT
  Filled 2011-03-21: qty 0.5

## 2011-03-21 MED ORDER — INSULIN ASPART 100 UNIT/ML ~~LOC~~ SOLN
0.0000 [IU] | Freq: Three times a day (TID) | SUBCUTANEOUS | Status: DC
  Administered 2011-03-22: 7 [IU] via SUBCUTANEOUS
  Administered 2011-03-22: 15 [IU] via SUBCUTANEOUS
  Administered 2011-03-22: 20 [IU] via SUBCUTANEOUS
  Administered 2011-03-23 (×3): 7 [IU] via SUBCUTANEOUS
  Administered 2011-03-24: 3 [IU] via SUBCUTANEOUS
  Administered 2011-03-24 – 2011-03-25 (×3): 11 [IU] via SUBCUTANEOUS
  Administered 2011-03-25: 4 [IU] via SUBCUTANEOUS
  Administered 2011-03-25: 11 [IU] via SUBCUTANEOUS
  Administered 2011-03-26: 7 [IU] via SUBCUTANEOUS
  Administered 2011-03-26: 15 [IU] via SUBCUTANEOUS
  Administered 2011-03-27: 7 [IU] via SUBCUTANEOUS
  Administered 2011-03-27: 3 [IU] via SUBCUTANEOUS
  Filled 2011-03-21: qty 3

## 2011-03-21 MED ORDER — INSULIN ASPART 100 UNIT/ML ~~LOC~~ SOLN
0.0000 [IU] | Freq: Three times a day (TID) | SUBCUTANEOUS | Status: DC
  Administered 2011-03-21: 7 [IU] via SUBCUTANEOUS
  Administered 2011-03-21: 20 [IU] via SUBCUTANEOUS

## 2011-03-21 MED ORDER — CLONAZEPAM 0.5 MG PO TABS
0.2500 mg | ORAL_TABLET | Freq: Two times a day (BID) | ORAL | Status: DC
  Administered 2011-03-21 – 2011-03-27 (×12): 0.25 mg via ORAL
  Filled 2011-03-21: qty 1
  Filled 2011-03-21: qty 2
  Filled 2011-03-21 (×9): qty 1

## 2011-03-21 MED ORDER — INSULIN ASPART 100 UNIT/ML ~~LOC~~ SOLN: 0.0000 [IU] | Freq: Every day | SUBCUTANEOUS | Status: DC

## 2011-03-21 MED ORDER — PANTOPRAZOLE SODIUM 40 MG PO TBEC
40.0000 mg | DELAYED_RELEASE_TABLET | Freq: Two times a day (BID) | ORAL | Status: DC
  Administered 2011-03-22 – 2011-03-27 (×12): 40 mg via ORAL
  Filled 2011-03-21 (×11): qty 1

## 2011-03-21 MED ORDER — INSULIN GLARGINE 100 UNIT/ML ~~LOC~~ SOLN
20.0000 [IU] | Freq: Every day | SUBCUTANEOUS | Status: DC
  Administered 2011-03-21: 20 [IU] via SUBCUTANEOUS

## 2011-03-21 MED ORDER — METHYLPREDNISOLONE SODIUM SUCC 125 MG IJ SOLR
60.0000 mg | Freq: Two times a day (BID) | INTRAMUSCULAR | Status: DC
  Administered 2011-03-22: 60 mg via INTRAVENOUS
  Filled 2011-03-21 (×2): qty 0.96

## 2011-03-21 MED ORDER — IPRATROPIUM BROMIDE 0.02 % IN SOLN
0.5000 mg | Freq: Four times a day (QID) | RESPIRATORY_TRACT | Status: DC
  Administered 2011-03-21 – 2011-03-22 (×2): 0.5 mg via RESPIRATORY_TRACT
  Filled 2011-03-21: qty 2.5

## 2011-03-21 MED ORDER — INSULIN ASPART 100 UNIT/ML ~~LOC~~ SOLN
0.0000 [IU] | Freq: Every day | SUBCUTANEOUS | Status: DC
  Administered 2011-03-21 – 2011-03-22 (×2): 3 [IU] via SUBCUTANEOUS
  Administered 2011-03-23: 4 [IU] via SUBCUTANEOUS
  Administered 2011-03-24: 2 [IU] via SUBCUTANEOUS

## 2011-03-21 MED ORDER — LEVOFLOXACIN 750 MG PO TABS
750.0000 mg | ORAL_TABLET | Freq: Every day | ORAL | Status: DC
  Administered 2011-03-22 – 2011-03-24 (×3): 750 mg via ORAL
  Filled 2011-03-21 (×3): qty 1

## 2011-03-21 MED ORDER — AMLODIPINE BESYLATE 10 MG PO TABS
10.0000 mg | ORAL_TABLET | Freq: Every day | ORAL | Status: DC
  Administered 2011-03-22 – 2011-03-27 (×6): 10 mg via ORAL
  Filled 2011-03-21 (×6): qty 1

## 2011-03-21 NOTE — Progress Notes (Addendum)
[facility] [place] TEAM 8  Subjective: 61 year-old female with history of CAD status post stenting who has had a cardiac catheter 2 weeks ago and showed a patent stents, history of COPD versus asthma with history of severe smoking quit a year and a half ago, hypertension,diabetes mellitus type 2 presented here because of increasing shortness of breath over the last 2-3 days.  Sitting up in chair today. She continues to have a hoarse voice and intermittent wheezing when communicating. She states today that the center of her chest hurts when she swallows food.   Objective: Weight change: 2.1 kg (4 lb 10.1 oz)  Intake/Output Summary (Last 24 hours) at 03/21/11 1709 Last data filed at 03/21/11 1500  Gross per 24 hour  Intake    800 ml  Output   1600 ml  Net   -800 ml   Blood pressure 133/71, pulse 72, temperature 98.3 F (36.8 C), temperature source Oral, resp. rate 20, height 4\' 11"  (1.499 m), weight 67.4 kg (148 lb 9.4 oz), SpO2 100.00%.  Physical Exam: General: No acute respiratory decline - exam as noted above Lungs: Clear to auscultation bilaterally in periph fields, with occasionally audible inspiratory wheeze Cardiovascular: Regular rate and rhythm without murmur gallop or rub normal S1 and S2 Abdomen: Nontender, nondistended, soft, bowel sounds positive, no rebound, no ascites, no appreciable mass Extremities: No significant cyanosis, clubbing, or edema bilateral lower extremities  Lab Results:  [facility] 03/20/11 0610 03/19/11 1619  NA 139 142  K 3.6 3.4*  CL 102 104  CO2 19 24  GLUCOSE 317* 103*  BUN 18 15  CREATININE 0.99 0.89  CALCIUM 9.6 10.2  MG 1.8 --  PHOS -- --    Basename 03/20/11 0610  AST 11  ALT 13  ALKPHOS 102  BILITOT 0.3  PROT 8.3  ALBUMIN 3.6    Basename 03/20/11 0610 03/19/11 1619  WBC 10.1 9.4  NEUTROABS -- 5.6  HGB 11.2* 7.8*  HCT 36.3 36.9  MCV 86.2 86.6  PLT 259 192    Basename 03/20/11 1310 03/20/11 0610 03/19/11 2237    CKTOTAL 69 64 83  CKMB 2.3 2.1 1.9  CKMBINDEX -- -- --  TROPONINI <0.30 <0.30 <0.30    Basename 03/19/11 2238  HGBA1C 7.2*    Micro Results: Recent Results (from the past 240 hour(s))  MRSA PCR SCREENING     Status: Normal   Collection Time   03/19/11 10:01 PM      Component Value Range Status Comment   MRSA by PCR NEGATIVE  NEGATIVE  Final     Studies/Results: All recent x-ray/radiology reports have been reviewed in detail.   Medications: I have reviewed the patient's complete medication list.  Assessment/Plan:  Shortness of breath/acute bronchospastic COPD exacerbation Possible vocal cord dysfunction -  Will order PFTs/flow volume loops to investigate further  Atypical chest pain Had a recent cardiac cath revealing patent coronary artery stents - has ruled out for acute coronary syndrome  Dysphagia-  On BID PPI already, Will order esophagram to evaluated for stricture.   Anemia Guaiac negative - hgb reportedly 7.8 at admit - now much improved at 11.2 - I am suspicious that the 7.8 value was erroneous - will recheck in AM  Uncontrolled Diabetes Due to steroids - continue to adjust tx and follow  Gastroesophageal reflux  Cont PPI   Hypertension Reasonably controlled at present   Hyperlipidemia On zocor  Coronary artery disease status post PCI to left circumflex and RCA  As per Cardiology   Non-alcoholic fatty liver disease  Debbe Odea, MD [facility] Office  [phone] Pager [phone]  On-Call/Text Page:      [email or web address]      password [facility]

## 2011-03-21 NOTE — Progress Notes (Signed)
Subjective:  Patient denies any anginal chest pain states her breathing has improved  Objective:  Vital Signs in the last 24 hours: Temp:  [98 F (36.7 C)-98.4 F (36.9 C)] 98.4 F (36.9 C) (02/13 0800) Pulse Rate:  [72-95] 72  (02/12 1700) Resp:  [18-22] 20  (02/12 1700) BP: (113-140)/(55-60) 113/55 mmHg (02/12 1700) SpO2:  [98 %-100 %] 100 % (02/13 0222) Weight:  [67.4 kg (148 lb 9.4 oz)] 67.4 kg (148 lb 9.4 oz) (02/13 0417)  Intake/Output from previous day: 02/12 0701 - 02/13 0700 In: 1040 [P.O.:780; IV Piggyback:260] Out: 1350 [Urine:1350] Intake/Output from this shift: Total I/O In: 240 [P.O.:240] Out: 400 [Urine:400]  Physical Exam: General appearance: alert and cooperative Neck: no adenopathy, no carotid bruit, no JVD and supple, symmetrical, trachea midline Lungs: Decreased breath sound at bases with faint expiratory wheezing air entry has improved Heart: regular rate and rhythm, S1, S2 normal and Soft systolic murmur no S3 gallop Abdomen: soft, non-tender; bowel sounds normal; no masses,  no organomegaly Extremities: extremities normal, atraumatic, no cyanosis or edema  Lab Results:  [facility] 03/20/11 0610 03/19/11 1619  WBC 10.1 9.4  HGB 11.2* 7.8*  PLT 259 192    Basename 03/20/11 0610 03/19/11 1619  NA 139 142  K 3.6 3.4*  CL 102 104  CO2 19 24  GLUCOSE 317* 103*  BUN 18 15  CREATININE 0.99 0.89    Basename 03/20/11 1310 03/20/11 0610  TROPONINI <0.30 <0.30   Hepatic Function Panel  Basename 03/20/11 0610  PROT 8.3  ALBUMIN 3.6  AST 11  ALT 13  ALKPHOS 102  BILITOT 0.3  BILIDIR --  IBILI --   No results found for this basename: CHOL in the last 72 hours No results found for this basename: PROTIME in the last 72 hours  Imaging: Imaging results have been reviewed and Dg Chest Port 1 View  03/19/2011  *RADIOLOGY REPORT*  Clinical Data: Chest tightness, weakness and shortness of breath.  PORTABLE CHEST - 1 VIEW  Comparison: Chest x-ray  02/18/2011.  Findings: Lung volumes are low.  Linear opacity at the base of the left hemithorax likely reflects subsegmental atelectasis. There is also blunting of the left costophrenic sulcus, which may suggest a small left-sided pleural effusion (however, this is favored to be projectional). No definite consolidative airspace disease.  No edema.  Heart size is upper limits of normal.  Mediastinal contours are unremarkable.  Atherosclerotic calcifications in the thoracic aorta.  IMPRESSION:  1. Low lung volumes with probable subsegmental atelectasis in the left lower lobe.  A trace left-sided pleural effusion is difficult to exclude (but is not favored). 2.  Atherosclerosis.  Original Report Authenticated By: Etheleen Mayhew, M.D.    Cardiac Studies:  Assessment/Plan:  Status post atypical chest pain MI ruled out Coronary artery disease stable Next is abrasion of bronchial asthma/bronchitis Hypertension Non-insulin-dependent diabetes mellitus Hypercholesteremia Acute on chronic anemia stable History of tobacco abuse History of colonic polyps GERD Plan continue present management I will sign off please call if needed Okay to discharge from cardiac point of view  LOS: 2 days    [name] 03/21/2011, 12:31 PM

## 2011-03-21 NOTE — Progress Notes (Signed)
Inpatient Diabetes Program Recommendations  AACE/ADA: New Consensus Statement on Inpatient Glycemic Control (2009)  Target Ranges:  Prepandial:   less than 140 mg/dL      Peak postprandial:   less than 180 mg/dL (1-2 hours)      Critically ill patients:  140 - 180 mg/dL   Reason for Visit: Elevated fasting glucose: 235 mg/dL  Inpatient Diabetes Program Recommendations Insulin - Basal: Titrate Lantus while on IV steroids;  Consider Lantus 20 units qhs based on fasting glucose this am Correction (SSI): Please increase Novolog SSI to Moderate scale tid ac + HS.

## 2011-03-21 NOTE — Progress Notes (Signed)
Ebony Hail, PA-C notified of CBG 416, new orders received. Will continue to monitor.

## 2011-03-21 NOTE — Progress Notes (Signed)
Chaplain's Note:  Visited pt room per referral.  Introduced self to pt and offered [place] ritual of Ashes for Christian observance of Ash Wednesday, per referral of pt request.  Offered ritual and prayer.  Pt thanked chaplain for visit.

## 2011-03-22 ENCOUNTER — Inpatient Hospital Stay (HOSPITAL_COMMUNITY): Payer: Medicaid Other

## 2011-03-22 LAB — CBC
MCHC: 31.2 g/dL (ref 30.0–36.0)
RBC: 4.1 MIL/uL (ref 3.87–5.11)
WBC: 17.9 10*3/uL — ABNORMAL HIGH (ref 4.0–10.5)

## 2011-03-22 LAB — GLUCOSE, CAPILLARY
Glucose-Capillary: 220 mg/dL — ABNORMAL HIGH (ref 70–99)
Glucose-Capillary: 338 mg/dL — ABNORMAL HIGH (ref 70–99)

## 2011-03-22 MED ORDER — IPRATROPIUM BROMIDE 0.02 % IN SOLN
0.5000 mg | Freq: Four times a day (QID) | RESPIRATORY_TRACT | Status: DC
  Administered 2011-03-22 – 2011-03-27 (×19): 0.5 mg via RESPIRATORY_TRACT
  Filled 2011-03-22 (×19): qty 2.5

## 2011-03-22 MED ORDER — INSULIN GLARGINE 100 UNIT/ML ~~LOC~~ SOLN
26.0000 [IU] | Freq: Every day | SUBCUTANEOUS | Status: DC
  Administered 2011-03-22: 26 [IU] via SUBCUTANEOUS

## 2011-03-22 MED ORDER — INSULIN GLARGINE 100 UNIT/ML ~~LOC~~ SOLN: 24.0000 [IU] | Freq: Every day | SUBCUTANEOUS | Status: DC

## 2011-03-22 MED ORDER — ALBUTEROL SULFATE (5 MG/ML) 0.5% IN NEBU
2.5000 mg | INHALATION_SOLUTION | Freq: Four times a day (QID) | RESPIRATORY_TRACT | Status: DC
Start: 2011-03-22 — End: 2011-03-27
  Administered 2011-03-22 – 2011-03-27 (×19): 2.5 mg via RESPIRATORY_TRACT
  Filled 2011-03-22 (×18): qty 0.5

## 2011-03-22 MED ORDER — METHYLPREDNISOLONE SODIUM SUCC 40 MG IJ SOLR
40.0000 mg | Freq: Two times a day (BID) | INTRAMUSCULAR | Status: DC
  Administered 2011-03-22 – 2011-03-23 (×2): 40 mg via INTRAVENOUS
  Filled 2011-03-22 (×4): qty 1

## 2011-03-23 ENCOUNTER — Inpatient Hospital Stay (HOSPITAL_COMMUNITY): Payer: Medicaid Other

## 2011-03-23 LAB — GLUCOSE, CAPILLARY

## 2011-03-23 MED ORDER — INSULIN GLARGINE 100 UNIT/ML ~~LOC~~ SOLN
28.0000 [IU] | Freq: Every day | SUBCUTANEOUS | Status: DC
  Administered 2011-03-23: 28 [IU] via SUBCUTANEOUS

## 2011-03-23 MED ORDER — PREDNISONE 20 MG PO TABS
40.0000 mg | ORAL_TABLET | Freq: Every day | ORAL | Status: DC
  Administered 2011-03-24 – 2011-03-26 (×3): 40 mg via ORAL
  Filled 2011-03-23 (×4): qty 2

## 2011-03-23 NOTE — Progress Notes (Signed)
[facility] [place] TEAM 8  Subjective: 61 year-old female with history of CAD status post stenting who has had a cardiac catheter 2 weeks ago and showed a patent stents, history of COPD versus asthma with history of severe smoking quit a year and a half ago, hypertension,diabetes mellitus type 2 presented here because of increasing shortness of breath over the last 2-3 days.  Sitting up in chair today. Her hoarse voice seems improved and she is no longer noted to have wheezing. She continues to have a cough productive of sputum and pain below her left breast every time she coughs.   She becomes tearful again when talking to me today. I have asked her multiple times each time I have seen her if she is depressed but she states that she is fine. Currently she looks like she is in a moderate amount of pain.   I was called late in the day by the nurse to be notified that the patient is now having pain in the left lower abdomen which is worse when she coughs.   Objective: Weight change: -0.2 kg (-7.1 oz)  Intake/Output Summary (Last 24 hours) at 03/23/11 1714 Last data filed at 03/23/11 1600  Gross per 24 hour  Intake    240 ml  Output   1450 ml  Net  -1210 ml   Blood pressure 137/65, pulse 85, temperature 98.1 F (36.7 C), temperature source Oral, resp. rate 19, height 4\' 11"  (1.499 m), weight 70.7 kg (155 lb 13.8 oz), SpO2 94.00%.  Physical Exam: General: No acute respiratory decline, very sad and tearful, moderate distress due to pain under left breast Lungs: Clear to auscultation bilaterally, breath sounds are coarse.  Cardiovascular: Regular rate and rhythm without murmur gallop or rub normal S1 and S2 Abdomen: mild tenderness in LLQ,  nondistended, soft, bowel sounds positive, no rebound, no ascites, no appreciable mass Extremities: No significant cyanosis, clubbing, or edema bilateral lower extremities  Lab Results: No results found for this basename:  NA:3,K:3,CL:3,CO2:3,GLUCOSE:3,BUN:3,CREATININE:3,CALCIUM:3,MG:3,PHOS:3 in the last 72 hours No results found for this basename: AST:2,ALT:2,ALKPHOS:2,BILITOT:2,PROT:2,ALBUMIN:2 in the last 72 hours  Basename 03/22/11 0413  WBC 17.9*  NEUTROABS --  HGB 11.1*  HCT 35.6*  MCV 86.8  PLT 244   No results found for this basename: CKTOTAL:3,CKMB:3,CKMBINDEX:3,TROPONINI:3 in the last 72 hours No results found for this basename: HGBA1C:12 in the last 72 hours  Micro Results: Recent Results (from the past 240 hour(s))  MRSA PCR SCREENING     Status: Normal   Collection Time   03/19/11 10:01 PM      Component Value Range Status Comment   MRSA by PCR NEGATIVE  NEGATIVE  Final     Studies/Results: All recent x-ray/radiology reports have been reviewed in detail.   Medications: I have reviewed the patient's complete medication list.  Assessment/Plan:  Shortness of breath/acute bronchospastic COPD exacerbation Possible vocal cord dysfunction - PFTs done but not yet resulted On room air, saturations are 95%, therefore she is stable to transfer out of SDU -Cont antibiotics to treat acute bronchitis and nebs. Transition off Solumedrol to Prednisone tomorrow.  Follow up on PFTs/flow volume loops   Dysphagia- barium swallow reveals possible esophageal dysmotility but she states that she is not having any substernal pain when swallowing today.  - will need to follow.   Atypical chest pain Had a recent cardiac cath revealing patent coronary artery stents - has ruled out for acute coronary syndrome -please see under dysphagia above.  Left lower quadrent pain - abd xray is normal. Will need to follow.  -This may be muscular to to her continued coughing or she may be developing a diverticulitis.  -We will need to watch for fevers.  -WBC counts are elevated due to steroids but as I am tapering them, the counts should either decrease or remain the same. If increase in WBC is noted, and she  continues to have LLQ pain, this would warrant further work up with a CT of the abdomen.   Anemia Guaiac negative - hgb reportedly 7.8 at admit - now much improved at 11.2 - I am suspicious that the 7.8 value was erroneous - will recheck in AM  Uncontrolled Diabetes Due to steroids - continue to adjust tx and follow  Gastroesophageal reflux  Cont PPI   Hypertension Reasonably controlled at present   Hyperlipidemia On zocor  Coronary artery disease status post PCI to left circumflex and RCA As per Cardiology   Non-alcoholic fatty liver disease  Debbe Odea, MD [facility] Office  [phone] Pager [phone]  On-Call/Text Page:      [email or web address]      password [facility]

## 2011-03-23 NOTE — Progress Notes (Signed)
Paged [employer] re pt c/o abdominal pain in LLQ, pain meds given with no relief. Will come and assess pt. Will continue to monitor. Suezanne Cheshire

## 2011-03-23 NOTE — Progress Notes (Signed)
Results for EMANI, ISOLA (MRN [id number]) as of 03/23/2011 10:37  Ref. Range 03/22/2011 07:51 03/22/2011 13:18 03/22/2011 16:51 03/22/2011 21:58  Glucose-Capillary Latest Range: 70-99 mg/dL 220 (H) 338 (H) 375 (H) 297 (H)    Noted Solumedrol decreased to 40 mg Q12 hours yesterday.  Also noted Lantus increased to 26 units QHS last evening.  Patient continues to have elevated fasting and postprandial CBGs, however, I am hesitant to recommend further increasing insulin as her steroids are decreasing.  May want to increase patient's Novolog meal coverage to 8 units tid with meals.    Will follow.  Patient has decent CBG control at home (A1C 7.2%- 03/19/11).    Wyn Quaker RN, MSN, CDE Diabetes Coordinator Inpatient Diabetes Program [phone]

## 2011-03-24 ENCOUNTER — Other Ambulatory Visit: Payer: Self-pay

## 2011-03-24 LAB — CBC
HCT: 38 % (ref 36.0–46.0)
Hemoglobin: 11.9 g/dL — ABNORMAL LOW (ref 12.0–15.0)
MCH: 26.7 pg (ref 26.0–34.0)
MCHC: 31.3 g/dL (ref 30.0–36.0)
MCV: 85.4 fL (ref 78.0–100.0)
Platelets: 248 K/uL (ref 150–400)
RBC: 4.45 MIL/uL (ref 3.87–5.11)
RDW: 17.4 % — ABNORMAL HIGH (ref 11.5–15.5)
WBC: 11.3 K/uL — ABNORMAL HIGH (ref 4.0–10.5)

## 2011-03-24 LAB — BASIC METABOLIC PANEL
BUN: 25 mg/dL — ABNORMAL HIGH (ref 6–23)
Calcium: 9.4 mg/dL (ref 8.4–10.5)
Creatinine, Ser: 1.07 mg/dL (ref 0.50–1.10)
GFR calc Af Amer: 63 mL/min — ABNORMAL LOW (ref >90)
GFR calc non Af Amer: 54 mL/min — ABNORMAL LOW (ref >90)

## 2011-03-24 LAB — GLUCOSE, CAPILLARY
Glucose-Capillary: 252 mg/dL — ABNORMAL HIGH (ref 70–99)
Glucose-Capillary: 253 mg/dL — ABNORMAL HIGH (ref 70–99)
Glucose-Capillary: 222 mg/dL — ABNORMAL HIGH (ref 70–99)

## 2011-03-24 LAB — HEMOGLOBIN A1C: Hgb A1c MFr Bld: 7.4 % — ABNORMAL HIGH (ref <5.7)

## 2011-03-24 MED ORDER — TRAMADOL HCL 50 MG PO TABS
25.0000 mg | ORAL_TABLET | Freq: Four times a day (QID) | ORAL | Status: DC
  Administered 2011-03-24 – 2011-03-25 (×4): 25 mg via ORAL
  Filled 2011-03-24 (×5): qty 1

## 2011-03-24 MED ORDER — BENZONATATE 100 MG PO CAPS
200.0000 mg | ORAL_CAPSULE | Freq: Three times a day (TID) | ORAL | Status: DC
  Administered 2011-03-24 – 2011-03-27 (×10): 200 mg via ORAL
  Filled 2011-03-24 (×13): qty 2

## 2011-03-24 MED ORDER — LEVOFLOXACIN 750 MG PO TABS: 750.0000 mg | ORAL_TABLET | ORAL | Status: DC

## 2011-03-24 MED ORDER — INSULIN GLARGINE 100 UNIT/ML ~~LOC~~ SOLN
30.0000 [IU] | Freq: Every day | SUBCUTANEOUS | Status: DC
  Administered 2011-03-24: 30 [IU] via SUBCUTANEOUS

## 2011-03-24 NOTE — Progress Notes (Signed)
Subjective: Has frequent cough, complains of occasional burning retrosternal discomfort. Has sharp left upper quadrant pain  Objective: Vital signs in last 24 hours: Temp:  [97.8 F (36.6 C)-98.7 F (37.1 C)] 98.4 F (36.9 C) (02/16 1317) Pulse Rate:  [86-107] 106  (02/16 1317) Resp:  [18-22] 20  (02/16 1317) BP: (112-157)/(63-79) 112/63 mmHg (02/16 1317) SpO2:  [92 %-99 %] 99 % (02/16 1317) Weight:  [57.3 kg (126 lb 5.2 oz)] 57.3 kg (126 lb 5.2 oz) (02/16 0721) Weight change:  Last BM Date: 03/23/11  Intake/Output from previous day: 02/15 0701 - 02/16 0700 In: 960 [P.O.:960] Out: 1450 [Urine:1450] Total I/O In: 480 [P.O.:480] Out: -    Physical Exam: General: Alert, communicative, not short of breath at rest.  HEENT:  Mild clinical pallor, no jaundice, no conjunctival injection or discharge. Hydration status is fair. NECK:  Supple, JVP not seen, no carotid bruits, no palpable lymphadenopathy, no palpable goiter. CHEST:  Clinically clear to auscultation, no wheezes, no crackles. HEART:  Sounds 1 and 2 heard, normal, regular, no murmurs. ABDOMEN:  Obese, soft, has sharply localized tenderness of the musculature over and just below left, lowermost ribs. No palpable organomegaly, no palpable masses, normal bowel sounds. GENITALIA:  Not examined. LOWER EXTREMITIES:  No pitting edema, palpable peripheral pulses. MUSCULOSKELETAL SYSTEM:  Generalized osteoarthritic changes, otherwise, normal. CENTRAL NERVOUS SYSTEM:  No focal neurologic deficit on gross examination.  Lab Results:  Basename 03/24/11 0600 03/22/11 0413  WBC 11.3* 17.9*  HGB 11.9* 11.1*  HCT 38.0 35.6*  PLT 248 244    Basename 03/24/11 0600  NA 140  K 3.4*  CL 103  CO2 28  GLUCOSE 165*  BUN 25*  CREATININE 1.07  CALCIUM 9.4   Recent Results (from the past 240 hour(s))  MRSA PCR SCREENING     Status: Normal   Collection Time   03/19/11 10:01 PM      Component Value Range Status Comment   MRSA by PCR  NEGATIVE  NEGATIVE  Final      Studies/Results: Dg Esophagus  03/22/2011  *RADIOLOGY REPORT*  Clinical Data: Dysphagia.  Patient admitted with shortness of breath.  Gastroesophageal reflux disease.  Diabetes.  Coronary artery disease.  Anemia.  Asthma.  ESOPHOGRAM/BARIUM SWALLOW  Technique:  Single contrast examination was performed using thin barium.  Fluoroscopy time:  2.4 minutes.  Comparison:  Plain film 03/19/2011.  Findings:  Exam was performed the patient in a LPO and RPO positions. Esophageal motility could not be directly evaluated secondary patient immobility.  There is no evidence of esophageal narrowing or stricture.  No obstruction to contrast passage.  There is suggestion of dysmotility, with contrast stasis on the non dedicated non prone swallows.  IMPRESSION:  1.  Moderately degraded exam, secondary to clinical factors. 2.  No anatomic cause for patient's dysphagia.  No evidence of esophageal stricture. 3.  Suspicion of esophageal dysmotility.  Original Report Authenticated By: Areta Haber, M.D.   Dg Abd Portable 1v  03/23/2011  *RADIOLOGY REPORT*  Clinical Data: Left sided abdominal pain  PORTABLE ABDOMEN - 1 VIEW  Comparison: Abdomen films of 04/26/2009.  Findings: A portable film of the abdomen shows barium scattered throughout the colon primarily in the transverse colon and right colon with no evidence of obstruction.  No small bowel distention is seen.  IMPRESSION: No bowel obstruction.  Barium scattered throughout the transverse colon.  Original Report Authenticated By: Joretta Bachelor, M.D.    Medications: Scheduled Meds:   .  albuterol  2.5 mg Nebulization Q6H  . amLODipine  10 mg Oral Daily  . aspirin EC  325 mg Oral Daily  . budesonide  0.5 mg Nebulization BID  . clonazePAM  0.25 mg Oral BID  . insulin aspart  0-20 Units Subcutaneous TID WC  . insulin aspart  0-5 Units Subcutaneous QHS  . insulin aspart  6 Units Subcutaneous TID WC  . insulin glargine  28 Units  Subcutaneous QHS  . ipratropium  0.5 mg Nebulization Q6H  . levofloxacin  750 mg Oral Q48H  . pantoprazole  40 mg Oral BID AC  . predniSONE  40 mg Oral Q breakfast  . simvastatin  40 mg Oral Daily  . sodium chloride  3 mL Intravenous Q12H  . sodium chloride  3 mL Intravenous Q12H  . DISCONTD: insulin glargine  26 Units Subcutaneous QHS  . DISCONTD: levofloxacin  750 mg Oral Daily  . DISCONTD: methylPREDNISolone (SOLU-MEDROL) injection  40 mg Intravenous Q12H   Continuous Infusions:  PRN Meds:.acetaminophen, acetaminophen, albuterol, chlorpheniramine-HYDROcodone, LORazepam, morphine injection, nitroGLYCERIN, ondansetron (ZOFRAN) IV, ondansetron  Assessment/Plan:  1. Shortness of breath/acute bronchospastic COPD exacerbation:  Patient is an ex-smoker, and quit approximately one year ago. She presented with SOB/Wheeze, secondary to infective exacerbation/acute bronchitis and appears to have responded quite well to bronchodilators and steroids. She was transitioned to oral steroid taper, from 03/23/11. And is on day# 7 of antibiotics. PFTs were done on suspicion of vocal cord dysfunction, but report is still pending.  2. Dysphagia: Barium swallow exam on 03/22/11, revealed possible esophageal dysmotility. This may be the etiology of her occasional retrosternal burning discomfort, on the basis of GERD. Will manage with twice daily PPI. 3 Atypical chest pain; Patient Had a recent cardiac cath revealing patent coronary artery stents, and has ruled out for acute coronary syndrome, with negative cardiac enzymes. We suspect the etiology of her non-cardiac pain, is GERD. 4. Left upper quadrent pain:  Patient has clearly reproducible tenderness of musculature, over and just below left lowermost ribs, exacerbated by cough, and appears musculoskeletal, secondary to frequent coughing. Will address with analgesics, and anti-tussives.  Abdominal x-ray of 03/23/11, was quite unremarkable.  5. Anemia:  Patient is  stool Guaiac-negative. Hb was reportedly 7.8 at admit - now much improved at 11.2 as of 02/20/11. 6. Uncontrolled Diabetes:  Likely exacerbated by steroid treatment. Anticipate improvement with steroid taper and adjustment of diabetic medication.  7. Gastroesophageal reflux: See above.  8. Hypertension:  Reasonably controlled at present  9. Hyperlipidemia: On Statin.  10. Coronary artery disease status post PCI to left circumflex and RCA:  Manage per cardiology recommendations. Appears stable, without evidence of ACS.      LOS: 5 days   Brolin Dambrosia,CHRISTOPHER 03/24/2011, 1:52 PM

## 2011-03-24 NOTE — ED Provider Notes (Signed)
Evaluation and management procedures were performed by the resident physician under my supervision/collaboration.  I evaluated this patient face-to-face at the time of encounter.  Please see my note dated at that time.   Charlena Cross, MD 03/24/11 [phone]

## 2011-03-25 ENCOUNTER — Inpatient Hospital Stay (HOSPITAL_COMMUNITY): Payer: Medicaid Other

## 2011-03-25 LAB — GLUCOSE, CAPILLARY
Glucose-Capillary: 152 mg/dL — ABNORMAL HIGH (ref 70–99)
Glucose-Capillary: 278 mg/dL — ABNORMAL HIGH (ref 70–99)
Glucose-Capillary: 174 mg/dL — ABNORMAL HIGH (ref 70–99)

## 2011-03-25 LAB — CBC
Hemoglobin: 11.8 g/dL — ABNORMAL LOW (ref 12.0–15.0)
MCHC: 31 g/dL (ref 30.0–36.0)
Platelets: 254 10*3/uL (ref 150–400)
RDW: 17.5 % — ABNORMAL HIGH (ref 11.5–15.5)

## 2011-03-25 LAB — BASIC METABOLIC PANEL
Calcium: 9.7 mg/dL (ref 8.4–10.5)
GFR calc Af Amer: 57 mL/min — ABNORMAL LOW (ref >90)
GFR calc non Af Amer: 49 mL/min — ABNORMAL LOW (ref >90)
Potassium: 3.7 mEq/L (ref 3.5–5.1)
Sodium: 141 mEq/L (ref 135–145)

## 2011-03-25 MED ORDER — CLONIDINE HCL 0.1 MG PO TABS
0.1000 mg | ORAL_TABLET | Freq: Two times a day (BID) | ORAL | Status: DC
  Administered 2011-03-25 – 2011-03-27 (×4): 0.1 mg via ORAL
  Filled 2011-03-25 (×6): qty 1

## 2011-03-25 MED ORDER — INSULIN GLARGINE 100 UNIT/ML ~~LOC~~ SOLN
35.0000 [IU] | Freq: Every day | SUBCUTANEOUS | Status: DC
  Administered 2011-03-25 – 2011-03-26 (×2): 35 [IU] via SUBCUTANEOUS

## 2011-03-25 MED ORDER — LEVOFLOXACIN 500 MG PO TABS
500.0000 mg | ORAL_TABLET | Freq: Every day | ORAL | Status: DC
  Administered 2011-03-25 – 2011-03-26 (×2): 500 mg via ORAL
  Filled 2011-03-25 (×3): qty 1

## 2011-03-25 MED ORDER — IOHEXOL 300 MG/ML  SOLN
100.0000 mL | Freq: Once | INTRAMUSCULAR | Status: AC | PRN
  Administered 2011-03-25: 100 mL via INTRAVENOUS

## 2011-03-25 MED ORDER — IOHEXOL 300 MG/ML  SOLN
20.0000 mL | INTRAMUSCULAR | Status: AC
Start: 2011-03-25 — End: 2011-03-25

## 2011-03-25 MED ORDER — TRAMADOL HCL 50 MG PO TABS
50.0000 mg | ORAL_TABLET | Freq: Three times a day (TID) | ORAL | Status: DC
  Administered 2011-03-25 – 2011-03-27 (×7): 50 mg via ORAL
  Filled 2011-03-25 (×6): qty 1

## 2011-03-25 MED ORDER — POLYETHYLENE GLYCOL 3350 17 G PO PACK
17.0000 g | PACK | Freq: Every day | ORAL | Status: DC
  Administered 2011-03-25 – 2011-03-27 (×3): 17 g via ORAL
  Filled 2011-03-25 (×3): qty 1

## 2011-03-25 NOTE — Progress Notes (Signed)
Subjective: Cough has improve, although phlegm is still yellow. Left upper quadrant pain is less. C/O constipation.  Objective: Vital signs in last 24 hours: Temp:  [98.4 F (36.9 C)-98.7 F (37.1 C)] 98.4 F (36.9 C) (02/17 0510) Pulse Rate:  [90-106] 90  (02/17 0510) Resp:  [20-21] 20  (02/17 0510) BP: (112-145)/(63-75) 145/75 mmHg (02/17 0510) SpO2:  [94 %-99 %] 99 % (02/17 0828) Weight change:  Last BM Date: 03/23/11  Intake/Output from previous day: 02/16 0701 - 02/17 0700 In: 480 [P.O.:480] Out: -      Physical Exam: General: Alert, communicative, not short of breath at rest, more comfortable today.  HEENT:  Mild clinical pallor, no jaundice, no conjunctival injection or discharge. Hydration status is fair. NECK:  Supple, JVP not seen, no carotid bruits, no palpable lymphadenopathy, no palpable goiter. CHEST:  Clinically clear to auscultation, no wheezes, no crackles. HEART:  Sounds 1 and 2 heard, normal, regular, no murmurs. ABDOMEN:  Obese, soft, has sharply localized tenderness of the musculature over and just below left, lowermost ribs. No palpable organomegaly, no palpable masses, normal bowel sounds. GENITALIA:  Not examined. LOWER EXTREMITIES:  No pitting edema, palpable peripheral pulses. MUSCULOSKELETAL SYSTEM:  Generalized osteoarthritic changes, otherwise, normal. CENTRAL NERVOUS SYSTEM:  No focal neurologic deficit on gross examination.  Lab Results:  Basename 03/25/11 0625 03/24/11 0600  WBC 11.4* 11.3*  HGB 11.8* 11.9*  HCT 38.1 38.0  PLT 254 248    Basename 03/25/11 0625 03/24/11 0600  NA 141 140  K 3.7 3.4*  CL 103 103  CO2 30 28  GLUCOSE 160* 165*  BUN 28* 25*  CREATININE 1.16* 1.07  CALCIUM 9.7 9.4   Recent Results (from the past 240 hour(s))  MRSA PCR SCREENING     Status: Normal   Collection Time   03/19/11 10:01 PM      Component Value Range Status Comment   MRSA by PCR NEGATIVE  NEGATIVE  Final      Studies/Results: Dg Abd  Portable 1v  03/23/2011  *RADIOLOGY REPORT*  Clinical Data: Left sided abdominal pain  PORTABLE ABDOMEN - 1 VIEW  Comparison: Abdomen films of 04/26/2009.  Findings: A portable film of the abdomen shows barium scattered throughout the colon primarily in the transverse colon and right colon with no evidence of obstruction.  No small bowel distention is seen.  IMPRESSION: No bowel obstruction.  Barium scattered throughout the transverse colon.  Original Report Authenticated By: Joretta Bachelor, M.D.    Medications: Scheduled Meds:    . albuterol  2.5 mg Nebulization Q6H  . amLODipine  10 mg Oral Daily  . aspirin EC  325 mg Oral Daily  . benzonatate  200 mg Oral TID  . budesonide  0.5 mg Nebulization BID  . clonazePAM  0.25 mg Oral BID  . insulin aspart  0-20 Units Subcutaneous TID WC  . insulin aspart  0-5 Units Subcutaneous QHS  . insulin aspart  6 Units Subcutaneous TID WC  . insulin glargine  30 Units Subcutaneous QHS  . ipratropium  0.5 mg Nebulization Q6H  . levofloxacin  750 mg Oral Q48H  . pantoprazole  40 mg Oral BID AC  . predniSONE  40 mg Oral Q breakfast  . simvastatin  40 mg Oral Daily  . sodium chloride  3 mL Intravenous Q12H  . sodium chloride  3 mL Intravenous Q12H  . traMADol  25 mg Oral Q6H  . DISCONTD: insulin glargine  28 Units Subcutaneous QHS  .  DISCONTD: levofloxacin  750 mg Oral Daily   Continuous Infusions:  PRN Meds:.acetaminophen, acetaminophen, albuterol, chlorpheniramine-HYDROcodone, LORazepam, morphine injection, nitroGLYCERIN, ondansetron (ZOFRAN) IV, ondansetron  Assessment/Plan:  1. Shortness of breath/acute bronchospastic COPD exacerbation:  Patient is an ex-smoker, and quit approximately one year ago. She presented with SOB/Wheeze, secondary to infective exacerbation/acute bronchitis and appears to have responded quite well to bronchodilators and steroids. She was transitioned to oral steroid taper, from 03/23/11. And is on day# 8 of antibiotics. PFTs  were done on suspicion of vocal cord dysfunction, but report is still pending.  2. Dysphagia: Barium swallow exam on 03/22/11, revealed possible esophageal dysmotility. This may be the etiology of her occasional retrosternal burning discomfort, on the basis of GERD. Managing with twice daily PPI. 3 Atypical chest pain; Patient Had a recent cardiac cath revealing patent coronary artery stents, and has ruled out for acute coronary syndrome, with negative cardiac enzymes. We suspect the etiology of her non-cardiac pain, is GERD. 4. Left upper quadrent pain:  Patient has clearly reproducible tenderness of musculature, over and just below left lowermost ribs, exacerbated by cough, and appears musculoskeletal, secondary to frequent coughing. Addressed with analgesics, and anti-tussives, with some improvement.  Abdominal x-ray of 03/23/11, was quite unremarkable. Will do abdominal CT scan today, to rule out acute intra-abdominal pathology. 5. Anemia:  Patient is stool Guaiac-negative. Hb was reportedly 7.8 at admission. Now much improved at 11.2 as of 02/20/11, without blood transfusion. Suspect initial CBC was erroneous. 6. Uncontrolled Diabetes:  Likely exacerbated by steroid treatment. Anticipate improvement with steroid taper and adjustment of diabetic medication. Lantus increased to 35 units today. 7. Gastroesophageal reflux: See above.  8. Hypertension:  Sub-optimal control on Norvasc alone. Will add Clonidine. 9. Hyperlipidemia: On Statin.  10. Coronary artery disease status post PCI to left circumflex and RCA:  Manage per cardiology recommendations. Appears stable, without evidence of ACS.      LOS: 6 days   Miranda Frese,CHRISTOPHER 03/25/2011, 12:19 PM

## 2011-03-26 LAB — BASIC METABOLIC PANEL
BUN: 22 mg/dL (ref 6–23)
Calcium: 9.4 mg/dL (ref 8.4–10.5)
GFR calc Af Amer: 61 mL/min — ABNORMAL LOW (ref >90)
GFR calc non Af Amer: 52 mL/min — ABNORMAL LOW (ref >90)
Potassium: 4.4 mEq/L (ref 3.5–5.1)
Sodium: 140 mEq/L (ref 135–145)

## 2011-03-26 LAB — CBC
MCH: 27 pg (ref 26.0–34.0)
MCHC: 31.1 g/dL (ref 30.0–36.0)
RDW: 18 % — ABNORMAL HIGH (ref 11.5–15.5)

## 2011-03-26 LAB — GLUCOSE, CAPILLARY: Glucose-Capillary: 170 mg/dL — ABNORMAL HIGH (ref 70–99)

## 2011-03-26 MED ORDER — PREDNISONE 20 MG PO TABS
30.0000 mg | ORAL_TABLET | Freq: Every day | ORAL | Status: DC
  Administered 2011-03-27: 30 mg via ORAL
  Filled 2011-03-26 (×3): qty 1

## 2011-03-26 NOTE — Progress Notes (Signed)
Subjective: Feeling much better. Left upper quadrant pain is much less.  Objective: Vital signs in last 24 hours: Temp:  [98 F (36.7 C)-98.6 F (37 C)] 98.6 F (37 C) (02/18 0648) Pulse Rate:  [76-88] 76  (02/18 0648) Resp:  [22] 22  (02/18 0648) BP: (118-120)/(70-73) 118/73 mmHg (02/18 0648) SpO2:  [94 %-100 %] 97 % (02/18 1415) FiO2 (%):  [28 %] 28 % (02/18 0648) Weight change:  Last BM Date: 03/23/11  Intake/Output from previous day:       Physical Exam: General: Sitting in chair, alert, communicative, not short of breath at rest.  HEENT:  Mild clinical pallor, no jaundice, no conjunctival injection or discharge. Hydration status is fair. NECK:  Supple, JVP not seen, no carotid bruits, no palpable lymphadenopathy, no palpable goiter. CHEST:  Clinically clear to auscultation, few wheezes, no crackles. HEART:  Sounds 1 and 2 heard, normal, regular, no murmurs. ABDOMEN:  Obese, soft, has sharply localized tenderness of the musculature over and just below left, lowermost ribs. No palpable organomegaly, no palpable masses, normal bowel sounds. GENITALIA:  Not examined. LOWER EXTREMITIES:  No pitting edema, palpable peripheral pulses. MUSCULOSKELETAL SYSTEM:  Generalized osteoarthritic changes, otherwise, normal. CENTRAL NERVOUS SYSTEM:  No focal neurologic deficit on gross examination.  Lab Results:  [facility] 03/26/11 0620 03/25/11 0625  WBC 12.3* 11.4*  HGB 11.4* 11.8*  HCT 36.6 38.1  PLT 239 254    Basename 03/26/11 0620 03/25/11 0625  NA 140 141  K 4.4 3.7  CL 101 103  CO2 31 30  GLUCOSE 95 160*  BUN 22 28*  CREATININE 1.10 1.16*  CALCIUM 9.4 9.7   Recent Results (from the past 240 hour(s))  MRSA PCR SCREENING     Status: Normal   Collection Time   03/19/11 10:01 PM      Component Value Range Status Comment   MRSA by PCR NEGATIVE  NEGATIVE  Final      Studies/Results: Ct Abdomen Pelvis W Contrast  03/25/2011  *RADIOLOGY REPORT*  Clinical Data: Upper  abdominal pain, initially beginning on the left and now also on the right.  CT ABDOMEN AND PELVIS WITH CONTRAST  Technique:  Multidetector CT imaging of the abdomen and pelvis was performed following the standard protocol during bolus administration of intravenous contrast.  Contrast: 113mL OMNIPAQUE IOHEXOL 300 MG/ML IV SOLN  Comparison: Previous examinations, including the CT dated 04/26/2009.  Findings: 5 mm gallstone in the gallbladder.  No gallbladder wall thickening or pericholecystic fluid.  Diffuse low density of the liver relative to the spleen.  Mild bibasilar atelectasis.  The right kidney remains small with mild enlargement of the left kidney.  Mildly dilated right renal pelvis to the level of the ureteropelvic junction, with mild progression.  Atheromatous arterial calcifications, including the proximal left renal artery. The proximal right renal artery is not clearly visible.  No urinary tract calculi seen.  High density barium and normal appearing colon in the mid distal small bowel.  Small bilateral inguinal hernias containing fat are unchanged.  Unremarkable uterus.  No adnexal masses or enlarged lymph nodes seen.  Mild lumbar and lower thoracic spine degenerative changes as well as extensive facet degenerative changes in the lower lumbar spine.  IMPRESSION:  1.  Cholelithiasis without evidence of cholecystitis. 2.  Diffuse hepatic steatosis. 3.  Stable small bilateral inguinal hernias containing fat. 4.  Stable right renal atrophy and left renal compensatory hypertrophy.  This is most likely due to longstanding renal artery stenosis on  the right. 5.  Atheromatous arterial calcifications, including the proximal left renal artery. 6.  Mild bibasilar atelectasis.  Original Report Authenticated By: Gerald Stabs, M.D.    Medications: Scheduled Meds:    . albuterol  2.5 mg Nebulization Q6H  . amLODipine  10 mg Oral Daily  . aspirin EC  325 mg Oral Daily  . benzonatate  200 mg Oral TID  .  budesonide  0.5 mg Nebulization BID  . clonazePAM  0.25 mg Oral BID  . cloNIDine  0.1 mg Oral BID  . insulin aspart  0-20 Units Subcutaneous TID WC  . insulin aspart  0-5 Units Subcutaneous QHS  . insulin aspart  6 Units Subcutaneous TID WC  . insulin glargine  35 Units Subcutaneous QHS  . iohexol  20 mL Oral Q1 Hr x 2  . ipratropium  0.5 mg Nebulization Q6H  . levofloxacin  500 mg Oral q1800  . pantoprazole  40 mg Oral BID AC  . polyethylene glycol  17 g Oral Daily  . predniSONE  40 mg Oral Q breakfast  . simvastatin  40 mg Oral Daily  . sodium chloride  3 mL Intravenous Q12H  . sodium chloride  3 mL Intravenous Q12H  . traMADol  50 mg Oral Q8H   Continuous Infusions:  PRN Meds:.acetaminophen, acetaminophen, albuterol, chlorpheniramine-HYDROcodone, iohexol, LORazepam, morphine injection, nitroGLYCERIN, ondansetron (ZOFRAN) IV, ondansetron  Assessment/Plan:  1. Shortness of breath/acute bronchospastic COPD exacerbation:  Patient is an ex-smoker, and quit approximately one year ago. She presented with SOB/Wheeze, secondary to infective exacerbation/acute bronchitis and appears to have responded quite well to bronchodilators and steroids. She was transitioned to oral steroid taper, from 03/23/11. And is on day# 9/10 of antibiotics. PFTs were done on suspicion of vocal cord dysfunction, but report is still pending.  2. Dysphagia: Barium swallow exam on 03/22/11, revealed possible esophageal dysmotility. This may be the etiology of her occasional retrosternal burning discomfort, on the basis of GERD. Managing with twice daily PPI. 3 Atypical chest pain; Patient Had a recent cardiac cath revealing patent coronary artery stents, and has ruled out for acute coronary syndrome, with negative cardiac enzymes. We suspect the etiology of her non-cardiac pain, is GERD. 4. Left upper quadrent pain:  Patient has clearly reproducible tenderness of musculature, over and just below left lowermost ribs,  exacerbated by cough, and appears musculoskeletal, secondary to frequent coughing. Addressed with analgesics, and anti-tussives, with some improvement.  Abdominal x-ray of 03/23/11, was quite unremarkable. Will do abdominal CT scan of 03/25/11, failed to reveal acute intra-abdominal pathology. Have reassured patient accordingly. 5. Anemia:  Patient is stool Guaiac-negative. Hb was reportedly 7.8 at admission. Now much improved at 11.2 as of 02/20/11, without blood transfusion. Suspect initial CBC was erroneous. 6. Uncontrolled Diabetes:  Likely exacerbated by steroid treatment. Anticipate improvement with steroid taper and adjustment of diabetic medication. Lantus increased to 35 units on 03/25/11. Patient is on SSI, diet and meal cover. 7. Gastroesophageal reflux: See above.  8. Hypertension:  Controlled on Norvasc and Clonidine. 9. Hyperlipidemia: On Statin.  10. Coronary artery disease status post PCI to left circumflex and RCA:  Manage per cardiology recommendations. Appears stable, without evidence of ACS.   Comment: Mobilize. Should be stable for DC in next few days. PT/OT evaluation requested.   LOS: 7 days   Raylea Adcox,CHRISTOPHER 03/26/2011, 3:07 PM

## 2011-03-26 NOTE — Progress Notes (Signed)
Physical Therapy Evaluation Patient Details Name: [name] MRN: [id number] DOB: [birth year] Today's Date: 03/26/2011  Problem List:  Patient Active Problem List  Diagnoses  . DM  . HYPERLIPIDEMIA  . HYPERTENSION  . MYOCARDIAL INFARCTION  . CORONARY ARTERY DISEASE  . GERD  . FATTY LIVER DISEASE  . COLONIC POLYPS, HX OF  . Cholelithiasis  . Chest pain at rest  . Acute respiratory failure  . Asthma exacerbation  . Chest pain  . Anemia  . Diabetes mellitus  . COPD (chronic obstructive pulmonary disease)    Past Medical History:  Past Medical History  Diagnosis Date  . History of colonic polyps   . GERD (gastroesophageal reflux disease)   . Hyperlipidemia   . Diabetes mellitus without mention of complication   . Hypertension   . Myocardial infarction   . Coronary artery disease   . Fatty liver disease, nonalcoholic   . Colon polyp   . Anemia   . Angina   . Shortness of breath   . Asthma    Past Surgical History:  Past Surgical History  Procedure Date  . Right colectomy     After colonic perforation  . Tubal ligation   . Coronary angioplasty with stent placement     PT Assessment/Plan/Recommendation PT Assessment Clinical Impression Statement: 61 yo female admitted with shortness of breath presents to PT with decr functional mobility/decreased gait speed/ decr endurance, and impairments listed below; will benefit from PT in the acute care setting to amximize independence and safety with mobility and enable safe dc home PT Recommendation/Assessment: Patient will need skilled PT in the acute care venue PT Problem List: Decreased activity tolerance;Decreased balance;Decreased mobility;Decreased knowledge of use of DME;Cardiopulmonary status limiting activity;Pain PT Therapy Diagnosis : Difficulty walking;Acute pain PT Plan PT Frequency: Min 3X/week PT Treatment/Interventions: DME instruction;Gait training;Stair training;Functional mobility training;Therapeutic  activities;Therapeutic exercise;Balance training;Patient/family education PT Recommendation Follow Up Recommendations: Home health PT Equipment Recommended: None recommended by PT PT Goals  Acute Rehab PT Goals PT Goal Formulation: With patient Time For Goal Achievement: 2 weeks Pt will go Supine/Side to Sit: with modified independence PT Goal: Supine/Side to Sit - Progress: Goal set today Pt will go Sit to Supine/Side: with modified independence PT Goal: Sit to Supine/Side - Progress: Goal set today Pt will go Sit to Stand: with modified independence PT Goal: Sit to Stand - Progress: Goal set today Pt will go Stand to Sit: with modified independence PT Goal: Stand to Sit - Progress: Goal set today Pt will Ambulate: >150 feet;with modified independence;with least restrictive assistive device PT Goal: Ambulate - Progress: Goal set today Pt will Go Up / Down Stairs: 3-5 stairs;with min assist PT Goal: Up/Down Stairs - Progress: Goal set today  PT Evaluation Precautions/Restrictions  Precautions Precautions: Fall Restrictions Other Position/Activity Restrictions: Pillow splint with deep breathing/coughing Prior Functioning  Home Living Lives With: Family (grandson) Receives Help From: Family;Personal care attendant (5 days a week; couple of hours; assist housework, errands) Type of Home: House Home Layout: One level Home Access: Stairs to enter Entrance Stairs-Rails: None Entrance Stairs-Number of Steps: 3 (at side door) Home Adaptive Equipment: Walker - rolling;Straight cane Prior Function Level of Independence: Independent with basic ADLs;Requires assistive device for independence (mostly uses cane) Able to Take Stairs?: Yes (with some assist) Cognition Cognition Arousal/Alertness: Awake/alert Overall Cognitive Status: Appears within functional limits for tasks assessed Sensation/Coordination Coordination Gross Motor Movements are Fluid and Coordinated: Yes (though  somewhat limited by pain) Fine Motor Movements  are Fluid and Coordinated: Yes Extremity Assessment RUE Assessment RUE Assessment: Within Functional Limits LUE Assessment LUE Assessment: Within Functional Limits RLE Assessment RLE Assessment: Within Functional Limits LLE Assessment LLE Assessment: Within Functional Limits Mobility (including Balance) Bed Mobility Bed Mobility: No Transfers Transfers: Yes Sit to Stand: 4: Min assist;With armrests Sit to Stand Details (indicate cue type and reason): cues for safety and armrest use Stand to Sit: 4: Min assist;With armrests Stand to Sit Details: cues to control descent with armrests Ambulation/Gait Ambulation/Gait: Yes Ambulation/Gait Assistance: 4: Min assist;5: Supervision Ambulation/Gait Assistance Details (indicate cue type and reason): cues for relaxing shoulders; noted tending to reach out for UE support, so provided pt with RW, which seemed to help, although her gait speed was quite slow with or without RW; two instances of LE buckling, which pt states also occur premorbidly Ambulation Distance (Feet): 200 Feet Assistive device: Rolling walker Gait Pattern: Decreased step length - right;Decreased step length - left Gait velocity: slow Stairs: Yes Stairs Assistance: 4: Min assist Stairs Assistance Details (indicate cue type and reason): cues for control; gave pt tips on directing her caregiver to give bil UE assist when ascending/descending steps Stair Management Technique: No rails;Forwards Number of Stairs: 2        End of Session PT - End of Session Activity Tolerance: Patient tolerated treatment well;Patient limited by pain Patient left: in chair;with call bell in reach Nurse Communication: Other (comment) (O2 sats >95% with amb on room air) General Behavior During Session: [facility] for tasks performed Cognition: [facility] for tasks performed  Roney [facility] [place], [place]  03/26/2011, 4:52 PM

## 2011-03-27 ENCOUNTER — Other Ambulatory Visit: Payer: Self-pay

## 2011-03-27 ENCOUNTER — Other Ambulatory Visit: Payer: Self-pay | Admitting: Internal Medicine

## 2011-03-27 LAB — GLUCOSE, CAPILLARY
Glucose-Capillary: 79 mg/dL (ref 70–99)
Glucose-Capillary: 149 mg/dL — ABNORMAL HIGH (ref 70–99)

## 2011-03-27 LAB — BASIC METABOLIC PANEL
CO2: 32 mEq/L (ref 19–32)
Chloride: 97 mEq/L (ref 96–112)
Creatinine, Ser: 0.97 mg/dL (ref 0.50–1.10)
GFR calc Af Amer: 71 mL/min — ABNORMAL LOW (ref >90)
Potassium: 4.1 mEq/L (ref 3.5–5.1)

## 2011-03-27 LAB — CBC
MCV: 86.7 fL (ref 78.0–100.0)
Platelets: 260 10*3/uL (ref 150–400)
RBC: 4.42 MIL/uL (ref 3.87–5.11)
RDW: 18.3 % — ABNORMAL HIGH (ref 11.5–15.5)
WBC: 13.6 10*3/uL — ABNORMAL HIGH (ref 4.0–10.5)

## 2011-03-27 MED ORDER — PREDNISONE 10 MG PO TABS: ORAL_TABLET | ORAL | Status: DC

## 2011-03-27 MED ORDER — BUDESONIDE 0.5 MG/2ML IN SUSP: 0.5000 mg | Freq: Two times a day (BID) | RESPIRATORY_TRACT | Status: DC

## 2011-03-27 MED ORDER — BENZONATATE 200 MG PO CAPS: 200.0000 mg | ORAL_CAPSULE | Freq: Three times a day (TID) | ORAL | Status: AC

## 2011-03-27 MED ORDER — "BD GETTING STARTED TAKE HOME KIT: 1ML X 30 G SYRINGES, "
1.0000 | Freq: Once | Status: DC
  Filled 2011-03-27: qty 1

## 2011-03-27 MED ORDER — INSULIN GLARGINE 100 UNIT/ML ~~LOC~~ SOLN: 35.0000 [IU] | Freq: Every day | SUBCUTANEOUS | Status: DC

## 2011-03-27 MED ORDER — CLONIDINE HCL 0.1 MG PO TABS: 0.1000 mg | ORAL_TABLET | Freq: Two times a day (BID) | ORAL | Status: DC

## 2011-03-27 MED ORDER — TRAMADOL HCL 50 MG PO TABS: 50.0000 mg | ORAL_TABLET | Freq: Three times a day (TID) | ORAL | Status: AC

## 2011-03-27 NOTE — Discharge Instructions (Signed)
Please follow up with your primary MD.

## 2011-03-27 NOTE — Discharge Summary (Signed)
Physician Discharge Summary  Patient ID: [name] MRN: [id number] DOB/AGE: 1950-12-09 61 y.o.  Admit date: 03/19/2011 Discharge date: 03/27/2011  Primary Care Physician:  Provider Not In System PMD: [facility]. Primary Cardiologist: Dr Terrence Dupont. Discharge Diagnoses:    Patient Active Problem List  Diagnoses  . DM  . HYPERLIPIDEMIA  . HYPERTENSION  . MYOCARDIAL INFARCTION  . CORONARY ARTERY DISEASE  . GERD  . FATTY LIVER DISEASE  . COLONIC POLYPS, HX OF  . Cholelithiasis  . Chest pain at rest  . Acute respiratory failure  . Asthma exacerbation  . Chest pain  . Anemia  . Diabetes mellitus  . COPD (chronic obstructive pulmonary disease)    Medication List  As of 03/27/2011  2:54 PM   STOP taking these medications         amoxicillin-clavulanate 875-125 MG per tablet      metoprolol succinate 50 MG 24 hr tablet         TAKE these medications         albuterol 108 (90 BASE) MCG/ACT inhaler   Commonly known as: PROVENTIL HFA;VENTOLIN HFA   Inhale 2 puffs into the lungs every 6 (six) hours as needed. For shortness of breath      amLODipine 5 MG tablet   Commonly known as: NORVASC   Take 5 mg by mouth daily.      aspirin 81 MG tablet   Take 81 mg by mouth daily.      atorvastatin 20 MG tablet   Commonly known as: LIPITOR   Take 20 mg by mouth daily.      benzonatate 200 MG capsule   Commonly known as: TESSALON   Take 1 capsule (200 mg total) by mouth 3 (three) times daily.      budesonide 0.5 MG/2ML nebulizer solution   Commonly known as: PULMICORT   Take 2 mLs (0.5 mg total) by nebulization 2 (two) times daily.      cloNIDine 0.1 MG tablet   Commonly known as: CATAPRES   Take 1 tablet (0.1 mg total) by mouth 2 (two) times daily.      famotidine 20 MG tablet   Commonly known as: PEPCID   Take 1 tablet (20 mg total) by mouth 2 (two) times daily.      insulin glargine 100 UNIT/ML injection   Commonly known as: LANTUS   Inject 35 Units into the skin  at bedtime.      irbesartan 150 MG tablet   Commonly known as: AVAPRO   Take 150 mg by mouth daily.      metFORMIN 500 MG tablet   Commonly known as: GLUCOPHAGE   Take 500 mg by mouth 2 (two) times daily with a meal.      NITROSTAT 0.4 MG SL tablet   Generic drug: nitroGLYCERIN   Place 0.4 mg under the tongue every 5 (five) minutes as needed. As needed      omeprazole 20 MG capsule   Commonly known as: PRILOSEC   Take 20 mg by mouth daily. 30 minutes before breakfast      ondansetron 4 MG tablet   Commonly known as: ZOFRAN   Take 4 mg by mouth daily as needed. As needed for nausea      predniSONE 10 MG tablet   Commonly known as: DELTASONE   Take 30 mg daily for 3 days, then 20 mg daily for 3 days, then 10 mg daily for 3 days, then stop.  traMADol 50 MG tablet   Commonly known as: ULTRAM   Take 1 tablet (50 mg total) by mouth every 8 (eight) hours.             Disposition and Follow-up:  Follow up with primary MD  Consults:  cardiology  Dr Charolette Forward, cardiologist.  Significant Diagnostic Studies:  Dg Chest Port 1 View  03/19/2011  *RADIOLOGY REPORT*  Clinical Data: Chest tightness, weakness and shortness of breath.  PORTABLE CHEST - 1 VIEW  Comparison: Chest x-ray 02/18/2011.  Findings: Lung volumes are low.  Linear opacity at the base of the left hemithorax likely reflects subsegmental atelectasis. There is also blunting of the left costophrenic sulcus, which may suggest a small left-sided pleural effusion (however, this is favored to be projectional). No definite consolidative airspace disease.  No edema.  Heart size is upper limits of normal.  Mediastinal contours are unremarkable.  Atherosclerotic calcifications in the thoracic aorta.  IMPRESSION:  1. Low lung volumes with probable subsegmental atelectasis in the left lower lobe.  A trace left-sided pleural effusion is difficult to exclude (but is not favored). 2.  Atherosclerosis.  Original Report  Authenticated By: Etheleen Mayhew, M.D.    Brief H and P: For complete details, refer to admission H and P. However, in brief, this is a 61 year old female, with history of CAD status post stenting who has had a cardiac catheter 2 weeks ago that showed patent stents, COPD versus asthma, heavy smoker, who quit a year and a half ago, hypertension,diabetes mellitus type 2 presenting with increasing shortness of breath over the last 2-3 days. Patient had gone to her PCP today at [facility] and was instructed to come to the ER. In the ER patient was found to be wheezy, chest x-ray was negative for acute findings. Patient had mild transient chest pain in the ED, and ABG showed pH of 7.37 PCO2 of 38.4 and PO2 of 66 on 4 L. She was admitted for further evaluation, investigation and management.  Physical Exam: On 03/27/11. General: Sitting in chair, alert, communicative, not short of breath at rest.  HEENT: Mild clinical pallor, no jaundice, no conjunctival injection or discharge. Hydration status is fair.  NECK: Supple, JVP not seen, no carotid bruits, no palpable lymphadenopathy, no palpable goiter.  CHEST: Clinically clear to auscultation, few wheezes, no crackles.  HEART: Sounds 1 and 2 heard, normal, regular, no murmurs.  ABDOMEN: Obese, soft, has sharply localized tenderness of the musculature over and just below left, lowermost ribs. No palpable organomegaly, no palpable masses, normal bowel sounds.  GENITALIA: Not examined.  LOWER EXTREMITIES: No pitting edema, palpable peripheral pulses.  MUSCULOSKELETAL SYSTEM: Generalized osteoarthritic changes, otherwise, normal.  CENTRAL NERVOUS SYSTEM: No focal neurologic deficit on gross examination.   Hospital Course:  1. Shortness of breath/acute bronchospastic COPD exacerbation:  Patient is an ex-smoker, and quit approximately one year ago. She presented with SOB/Wheeze, secondary to infective exacerbation/acute bronchitis and responded quite well to  bronchodilators and steroids. She was transitioned to oral steroid taper, from 03/23/11. Completed 10 days of antibiotics on 02/24/11. PFTs were done on suspicion of possible vocal cord dysfunction, but report is still pending.  2. Dysphagia: Patient did complain of retrosternal discomfort on eating. Barium swallow exam on 03/22/11, revealed possible esophageal dysmotility. This may be the etiology of her occasional retrosternal burning discomfort, on the basis of GERD. Managing with twice daily PPI.  3 Atypical chest pain;  Patient Had a recent cardiac cath revealing  patent coronary artery stents, and has ruled out for acute coronary syndrome, with negative cardiac enzymes. We suspect the etiology of her non-cardiac pain, is GERD.  Cardiology consultation was provided by Dr Charolette Forward. 4. Left upper quadrent pain:  Patient was noted to have clearly reproducible tenderness of musculature, over and just below left lowermost ribs, exacerbated by cough, and appears musculoskeletal, secondary to frequent coughing. This was satisfactorily addressed with analgesics, and anti-tussives, with improvement. Abdominal x-ray of 03/23/11, was quite unremarkable. Will do abdominal CT scan of 03/25/11, failed to reveal acute intra-abdominal pathology. Have reassured patient accordingly.  5. Anemia:  Patient is stool Guaiac-negative. Hb was reportedly 7.8 at admission. Now much improved at 12.0 as of 02/24/11, without blood transfusion. Suspect initial CBC was erroneous.  6. Uncontrolled Diabetes:  Patient's diabetes was likely exacerbated by steroid treatment, and improved with steroid taper and adjustment of diabetic medication. Patient is on SSI, diet and Lantus..  7. Gastroesophageal reflux: See above.  8. Hypertension: Controlled on Norvasc and Clonidine.  9. Hyperlipidemia: On Statin.  10. Coronary artery disease status post PCI to left circumflex and RCA: Manage per cardiology recommendations. Appears stable,  without evidence of ACS.   Comment: Patient is considered stable for  stable for discharge on 03/27/11. PT/OT has recommended HHPt/OT and intermittent supervision.    Time spent on Discharge: 45 mins.  Signed: Rubi Tooley,CHRISTOPHER 03/27/2011, 2:54 PM

## 2011-03-27 NOTE — Progress Notes (Signed)
Physical Therapy Treatment Patient Details Name: [name] MRN: [id number] DOB: [birth year] Today's Date: 03/27/2011  PT Assessment/Plan  PT - Assessment/Plan Comments on Treatment Session: Pt progressing with mobility. Asked to get ambulating O2. O2 @95 % on RA. RN made aware PT Plan: Discharge plan remains appropriate PT Frequency: Min 3X/week Follow Up Recommendations: Home health PT Equipment Recommended: None recommended by PT PT Goals  Acute Rehab PT Goals PT Goal: Sit to Stand - Progress: Progressing toward goal PT Goal: Stand to Sit - Progress: Progressing toward goal PT Goal: Ambulate - Progress: Progressing toward goal PT Goal: Up/Down Stairs - Progress: Progressing toward goal  PT Treatment Precautions/Restrictions  Precautions Precautions: Fall Restrictions Weight Bearing Restrictions: No Other Position/Activity Restrictions: Pillow splint with deep breathing/coughing Mobility (including Balance) Bed Mobility Bed Mobility: No Transfers Sit to Stand: 5: Supervision;From chair/3-in-1;With upper extremity assist Stand to Sit: To chair/3-in-1;5: Supervision;With upper extremity assist Stand to Sit Details: Cues for safe hand placement. Pt has tendency to pull up using RW Ambulation/Gait Ambulation/Gait Assistance: 5: Supervision Ambulation/Gait Assistance Details (indicate cue type and reason): Pt with no evidence of LE buckling. Pt still noted to have slightly decreased cadence. Cues for management of RW as patient was trying to avoid hitting obstacles in hallway.  Ambulation Distance (Feet): 240 Feet Assistive device: Rolling walker Gait Pattern: Step-through pattern;Decreased stride length Gait velocity: decreased Stairs: Yes Stairs Assistance: 4: Min assist Stairs Assistance Details (indicate cue type and reason): MinA for HHA Stair Management Technique: No rails Number of Stairs: 2     Exercise  General Exercises - Lower Extremity Long Arc Quad: AROM;10  reps;Both Hip Flexion/Marching: AROM;10 reps;Both End of Session PT - End of Session Equipment Utilized During Treatment: Gait belt Activity Tolerance: Patient tolerated treatment well Patient left: in chair Nurse Communication: Mobility status for transfers;Mobility status for ambulation General Behavior During Session: [facility] for tasks performed Cognition: [facility] for tasks performed  Jamaar Howes, Tonia Brooms 03/27/2011, 2:30 PM 03/27/2011 Jacqualyn Posey PTA [phone] pager [phone] office

## 2011-03-27 NOTE — Evaluation (Signed)
Occupational Therapy Evaluation Patient Details Name: [name] MRN: [id number] DOB: November 21, 1950 Today's Date: 03/27/2011  Problem List:  Patient Active Problem List  Diagnoses  . DM  . HYPERLIPIDEMIA  . HYPERTENSION  . MYOCARDIAL INFARCTION  . CORONARY ARTERY DISEASE  . GERD  . FATTY LIVER DISEASE  . COLONIC POLYPS, HX OF  . Cholelithiasis  . Chest pain at rest  . Acute respiratory failure  . Asthma exacerbation  . Chest pain  . Anemia  . Diabetes mellitus  . COPD (chronic obstructive pulmonary disease)    Past Medical History:  Past Medical History  Diagnosis Date  . History of colonic polyps   . GERD (gastroesophageal reflux disease)   . Hyperlipidemia   . Diabetes mellitus without mention of complication   . Hypertension   . Myocardial infarction   . Coronary artery disease   . Fatty liver disease, nonalcoholic   . Colon polyp   . Anemia   . Angina   . Shortness of breath   . Asthma    Past Surgical History:  Past Surgical History  Procedure Date  . Right colectomy     After colonic perforation  . Tubal ligation   . Coronary angioplasty with stent placement     OT Assessment/Plan/Recommendation OT Assessment Clinical Impression Statement: Pt. presents with SOB and below problem list. Pt. will benefit from skilled OT to increase functional independence with ADLs to supervision level at D/C home to increase pt. safety. OT Recommendation/Assessment: Patient will need skilled OT in the acute care venue OT Problem List: Decreased strength;Decreased activity tolerance;Decreased safety awareness;Decreased knowledge of use of DME or AE;Cardiopulmonary status limiting activity Barriers to Discharge: None OT Therapy Diagnosis : Generalized weakness OT Plan OT Frequency: Min 2X/week OT Treatment/Interventions: Self-care/ADL training;Energy conservation;DME and/or AE instruction;Therapeutic activities;Patient/family education OT Recommendation Follow Up  Recommendations: Home health OT;Supervision - Intermittent Equipment Recommended: None recommended by OT Individuals Consulted Consulted and Agree with Results and Recommendations: Patient OT Goals Acute Rehab OT Goals OT Goal Formulation: With patient Time For Goal Achievement: 7 days ADL Goals Pt Will Perform Grooming: with set-up;with supervision;Standing at sink ADL Goal: Grooming - Progress: Goal set today Pt Will Perform Lower Body Dressing: with set-up;with supervision;Sit to stand from chair;with adaptive equipment ADL Goal: Lower Body Dressing - Progress: Goal set today Pt Will Transfer to Toilet: with supervision;Ambulation;with DME;3-in-1 ADL Goal: Toilet Transfer - Progress: Goal set today Pt Will Perform Tub/Shower Transfer: Tub transfer;with supervision;Ambulation;with DME;Transfer tub bench ADL Goal: Tub/Shower Transfer - Progress: Goal set today  OT Evaluation Precautions/Restrictions  Precautions Precautions: Fall Restrictions Weight Bearing Restrictions: No Other Position/Activity Restrictions: Pillow splint with deep breathing/coughing Prior Functioning Home Living Lives With: Family Receives Help From: Family;Personal care attendant Type of Home: House Home Layout: One level Home Access: Stairs to enter Entrance Stairs-Rails: None Technical brewer of Steps: 3 Bathroom Shower/Tub: Multimedia programmer: Standard Bathroom Accessibility: Yes How Accessible: Accessible via walker Home Adaptive Equipment: Bedside commode/3-in-1;Tub transfer bench;Walker - rolling;Straight cane Prior Function Level of Independence: Independent with basic ADLs;Requires assistive device for independence Able to Take Stairs?: Yes Driving: No Vocation: Retired Leisure: Hobbies-yes (Comment) Comments: Pt. enjoys cooking ADL ADL Eating/Feeding: Performed;Independent Where Assessed - Eating/Feeding: Chair Grooming: Performed;Wash/dry hands;Wash/dry face;Set  up;Other (comment) (min guard assist) Grooming Details (indicate cue type and reason): Pt. provided with min guard assist and mod verbal cues for safety with RW  Where Assessed - Grooming: Standing at sink Upper Body Bathing: Simulated;Chest;Right arm;Left arm;Abdomen;Set  up Where Assessed - Upper Body Bathing: Sitting, chair Lower Body Bathing: Simulated;Maximal assistance Lower Body Bathing Details (indicate cue type and reason): Pt. unable to reach LE and becomes SOB with bending forward. Educated pt. on use of long handled sponge to complete Where Assessed - Lower Body Bathing: Sit to stand from chair Upper Body Dressing: Performed;Minimal assistance Upper Body Dressing Details (indicate cue type and reason): with donning gown Where Assessed - Upper Body Dressing: Sitting, chair Lower Body Dressing: Simulated;Maximal assistance Lower Body Dressing Details (indicate cue type and reason): See LB bathing Where Assessed - Lower Body Dressing: Sit to stand from chair Toilet Transfer: Performed;Other (comment) (min guard assist ) Toilet Transfer Method: Ambulating Toilet Transfer Equipment: Raised toilet seat with arms (or 3-in-1 over toilet) Toileting - Clothing Manipulation: Simulated;Moderate assistance Where Assessed - Toileting Clothing Manipulation: Standing Toileting - Hygiene: Simulated;Set up Where Assessed - Toileting Hygiene: Sit on 3-in-1 or toilet Tub/Shower Transfer: Not assessed Tub/Shower Transfer Method: Not assessed Equipment Used: Rolling walker Ambulation Related to ADLs: Pt. provided with close supervision and min verbal cues for safety with RW ADL Comments: Pt. provided with energy conservation technique handout to increase pt. independence with ADLs by decreasing SOB. Pt. provided with mod verbal cues for breathing techniques during activity. Pt. with SpO2 of 94% on RA during activity. Extremity Assessment RUE Assessment RUE Assessment: Within Functional Limits LUE  Assessment LUE Assessment: Within Functional Limits Mobility  Bed Mobility Bed Mobility: Yes Supine to Sit: 5: Supervision;With rails Sitting - Scoot to Edge of Bed: 5: Supervision Transfers Transfers: Yes Sit to Stand: Other (comment);From bed;With upper extremity assist (min guard assist ) Sit to Stand Details (indicate cue type and reason): min verbal cues for hand placement  Exercises   End of Session OT - End of Session Equipment Utilized During Treatment: Gait belt Activity Tolerance: Patient tolerated treatment well Patient left: in chair;with call bell in reach Nurse Communication: Mobility status for transfers General Behavior During Session: [facility] for tasks performed Cognition: [facility] for tasks performed   Kiyona Mcnall, OTR/L Pager [phone] 03/27/2011, 8:46 AM

## 2011-03-28 NOTE — Telephone Encounter (Signed)
-----   Message -----    From: Lafayette Dragon, MD    Sent: 03/27/2011   8:17 PM      To: Dixon Boos, CMA Subject: Zofran                                         Please hold off refilling Zofran pending her GI follow up, or have her PCP (? Dr Terrence Dupont?) refill ----- Message -----    From: Dixon Boos, CMA    Sent: 03/27/2011   2:33 PM      To: Lafayette Dragon, MD  Dr Olevia Perches- This patient has been getting ondansetron from [place] for several months. However, it looks like she is in the hospital for cardiac issues. I noticed in the D/C note it says "ASK your doctor about the following medications...... Ondansetron." Not sure if the d/c summary typically says this but just wanted to ask if you want her to continue on this since there is mention of discussing the med with MD.

## 2011-04-04 ENCOUNTER — Other Ambulatory Visit (HOSPITAL_COMMUNITY): Payer: Self-pay | Admitting: Internal Medicine

## 2011-04-30 ENCOUNTER — Other Ambulatory Visit: Payer: Self-pay | Admitting: Physician Assistant

## 2011-05-03 NOTE — Telephone Encounter (Signed)
Contact office for next refill.

## 2011-05-06 ENCOUNTER — Observation Stay (HOSPITAL_COMMUNITY)
Admission: EM | Admit: 2011-05-06 | Discharge: 2011-05-09 | Disposition: A | Discharge status: Home / Self Care | Payer: Medicaid Other | Attending: Cardiovascular Disease | Admitting: Cardiovascular Disease

## 2011-05-06 ENCOUNTER — Emergency Department (HOSPITAL_COMMUNITY): Payer: Medicaid Other

## 2011-05-06 ENCOUNTER — Other Ambulatory Visit: Payer: Self-pay

## 2011-05-06 ENCOUNTER — Encounter (HOSPITAL_COMMUNITY): Payer: Self-pay

## 2011-05-06 DIAGNOSIS — Z9861 Coronary angioplasty status: Secondary | ICD-10-CM | POA: Insufficient documentation

## 2011-05-06 DIAGNOSIS — I2510 Atherosclerotic heart disease of native coronary artery without angina pectoris: Secondary | ICD-10-CM | POA: Insufficient documentation

## 2011-05-06 DIAGNOSIS — R0789 Other chest pain: Principal | ICD-10-CM | POA: Insufficient documentation

## 2011-05-06 DIAGNOSIS — D649 Anemia, unspecified: Secondary | ICD-10-CM | POA: Insufficient documentation

## 2011-05-06 DIAGNOSIS — F411 Generalized anxiety disorder: Secondary | ICD-10-CM | POA: Insufficient documentation

## 2011-05-06 DIAGNOSIS — R0602 Shortness of breath: Secondary | ICD-10-CM | POA: Insufficient documentation

## 2011-05-06 DIAGNOSIS — E7800 Pure hypercholesterolemia, unspecified: Secondary | ICD-10-CM | POA: Insufficient documentation

## 2011-05-06 DIAGNOSIS — E119 Type 2 diabetes mellitus without complications: Secondary | ICD-10-CM | POA: Insufficient documentation

## 2011-05-06 DIAGNOSIS — I10 Essential (primary) hypertension: Secondary | ICD-10-CM | POA: Insufficient documentation

## 2011-05-06 LAB — URINE MICROSCOPIC-ADD ON

## 2011-05-06 LAB — POCT I-STAT, CHEM 8
Chloride: 104 mEq/L (ref 96–112)
Creatinine, Ser: 1.1 mg/dL (ref 0.50–1.10)
Glucose, Bld: 179 mg/dL — ABNORMAL HIGH (ref 70–99)
Hemoglobin: 13.9 g/dL (ref 12.0–15.0)
Potassium: 4.1 mEq/L (ref 3.5–5.1)
Sodium: 141 mEq/L (ref 135–145)

## 2011-05-06 LAB — URINALYSIS, ROUTINE W REFLEX MICROSCOPIC
Bilirubin Urine: NEGATIVE
Glucose, UA: NEGATIVE mg/dL
Hgb urine dipstick: NEGATIVE
Specific Gravity, Urine: 1.019 (ref 1.005–1.030)
pH: 6.5 (ref 5.0–8.0)

## 2011-05-06 LAB — DIFFERENTIAL
Eosinophils Relative: 1 % (ref 0–5)
Lymphs Abs: 2.1 10*3/uL (ref 0.7–4.0)
Monocytes Absolute: 1.4 10*3/uL — ABNORMAL HIGH (ref 0.1–1.0)

## 2011-05-06 LAB — MRSA PCR SCREENING: MRSA by PCR: NEGATIVE

## 2011-05-06 LAB — CBC
HCT: 33.8 % — ABNORMAL LOW (ref 36.0–46.0)
Hemoglobin: 10.5 g/dL — ABNORMAL LOW (ref 12.0–15.0)
MCH: 27.2 pg (ref 26.0–34.0)
MCV: 88.8 fL (ref 78.0–100.0)
Platelets: 386 10*3/uL (ref 150–400)
RDW: 17.6 % — ABNORMAL HIGH (ref 11.5–15.5)
RDW: 17.7 % — ABNORMAL HIGH (ref 11.5–15.5)
WBC: 10.5 10*3/uL (ref 4.0–10.5)

## 2011-05-06 LAB — CREATININE, SERUM
Creatinine, Ser: 0.98 mg/dL (ref 0.50–1.10)
GFR calc Af Amer: 70 mL/min — ABNORMAL LOW (ref >90)
GFR calc non Af Amer: 60 mL/min — ABNORMAL LOW (ref >90)

## 2011-05-06 LAB — COMPREHENSIVE METABOLIC PANEL
ALT: 21 U/L (ref 0–35)
AST: 20 U/L (ref 0–37)
Albumin: 3.8 g/dL (ref 3.5–5.2)
Alkaline Phosphatase: 129 U/L — ABNORMAL HIGH (ref 39–117)
CO2: 25 mEq/L (ref 19–32)
Chloride: 101 mEq/L (ref 96–112)
Creatinine, Ser: 0.96 mg/dL (ref 0.50–1.10)
GFR calc non Af Amer: 62 mL/min — ABNORMAL LOW (ref >90)
Potassium: 4.2 mEq/L (ref 3.5–5.1)
Total Bilirubin: 0.6 mg/dL (ref 0.3–1.2)

## 2011-05-06 LAB — CARDIAC PANEL(CRET KIN+CKTOT+MB+TROPI)
Relative Index: INVALID (ref 0.0–2.5)
Total CK: 57 U/L (ref 7–177)
Troponin I: <0.3 ng/mL (ref <0.30)

## 2011-05-06 LAB — POCT I-STAT TROPONIN I

## 2011-05-06 MED ORDER — ASPIRIN 81 MG PO CHEW
CHEWABLE_TABLET | ORAL | Status: AC
  Filled 2011-05-06: qty 4

## 2011-05-06 MED ORDER — ASPIRIN 300 MG RE SUPP
300.0000 mg | RECTAL | Status: AC
  Filled 2011-05-06: qty 1

## 2011-05-06 MED ORDER — FAMOTIDINE 20 MG PO TABS
20.0000 mg | ORAL_TABLET | Freq: Two times a day (BID) | ORAL | Status: DC
  Administered 2011-05-06 – 2011-05-09 (×6): 20 mg via ORAL
  Filled 2011-05-06 (×8): qty 1

## 2011-05-06 MED ORDER — AMLODIPINE BESYLATE 5 MG PO TABS
5.0000 mg | ORAL_TABLET | Freq: Every day | ORAL | Status: DC
  Administered 2011-05-07 – 2011-05-09 (×3): 5 mg via ORAL
  Filled 2011-05-06 (×3): qty 1

## 2011-05-06 MED ORDER — ONDANSETRON HCL 4 MG/2ML IJ SOLN
4.0000 mg | Freq: Once | INTRAMUSCULAR | Status: AC
  Administered 2011-05-06: 4 mg via INTRAVENOUS

## 2011-05-06 MED ORDER — NITROGLYCERIN IN D5W 200-5 MCG/ML-% IV SOLN
INTRAVENOUS | Status: AC
  Filled 2011-05-06: qty 250

## 2011-05-06 MED ORDER — INSULIN GLARGINE 100 UNIT/ML ~~LOC~~ SOLN
40.0000 [IU] | Freq: Every day | SUBCUTANEOUS | Status: DC
  Administered 2011-05-06 – 2011-05-08 (×3): 40 [IU] via SUBCUTANEOUS

## 2011-05-06 MED ORDER — NITROGLYCERIN 0.4 MG SL SUBL
SUBLINGUAL_TABLET | SUBLINGUAL | Status: AC
  Filled 2011-05-06: qty 25

## 2011-05-06 MED ORDER — SIMVASTATIN 20 MG PO TABS
20.0000 mg | ORAL_TABLET | Freq: Every day | ORAL | Status: DC
  Administered 2011-05-07 – 2011-05-08 (×2): 20 mg via ORAL
  Filled 2011-05-06 (×3): qty 1

## 2011-05-06 MED ORDER — LORAZEPAM 2 MG/ML IJ SOLN
1.0000 mg | Freq: Once | INTRAMUSCULAR | Status: AC
  Administered 2011-05-06: 1 mg via INTRAVENOUS
  Filled 2011-05-06: qty 1

## 2011-05-06 MED ORDER — CLONIDINE HCL 0.1 MG PO TABS
0.1000 mg | ORAL_TABLET | Freq: Two times a day (BID) | ORAL | Status: DC
  Administered 2011-05-06 – 2011-05-07 (×2): 0.1 mg via ORAL
  Filled 2011-05-06 (×3): qty 1

## 2011-05-06 MED ORDER — SODIUM CHLORIDE 0.9 % IV SOLN: 250.0000 mL | INTRAVENOUS | Status: DC | PRN

## 2011-05-06 MED ORDER — IRBESARTAN 150 MG PO TABS
150.0000 mg | ORAL_TABLET | Freq: Every day | ORAL | Status: DC
Start: 2011-05-07 — End: 2011-05-09
  Administered 2011-05-07 – 2011-05-09 (×3): 150 mg via ORAL
  Filled 2011-05-06 (×3): qty 1

## 2011-05-06 MED ORDER — NITROGLYCERIN 0.4 MG SL SUBL
SUBLINGUAL_TABLET | SUBLINGUAL | Status: AC
  Administered 2011-05-06: 0.4 mg via SUBLINGUAL
  Filled 2011-05-06: qty 25

## 2011-05-06 MED ORDER — SODIUM CHLORIDE 0.9 % IJ SOLN
3.0000 mL | Freq: Two times a day (BID) | INTRAMUSCULAR | Status: DC
  Administered 2011-05-07 – 2011-05-09 (×5): 3 mL via INTRAVENOUS

## 2011-05-06 MED ORDER — ASPIRIN EC 81 MG PO TBEC
81.0000 mg | DELAYED_RELEASE_TABLET | Freq: Every day | ORAL | Status: DC
  Administered 2011-05-07 – 2011-05-09 (×3): 81 mg via ORAL
  Filled 2011-05-06 (×3): qty 1

## 2011-05-06 MED ORDER — ASPIRIN 81 MG PO CHEW
324.0000 mg | CHEWABLE_TABLET | Freq: Once | ORAL | Status: AC
  Administered 2011-05-06: 324 mg via ORAL

## 2011-05-06 MED ORDER — ONDANSETRON HCL 4 MG/2ML IJ SOLN
INTRAMUSCULAR | Status: AC
  Filled 2011-05-06: qty 2

## 2011-05-06 MED ORDER — SODIUM CHLORIDE 0.9 % IV SOLN
1000.0000 mL | INTRAVENOUS | Status: DC
  Administered 2011-05-06: 1000 mL via INTRAVENOUS

## 2011-05-06 MED ORDER — MORPHINE SULFATE 4 MG/ML IJ SOLN
4.0000 mg | Freq: Once | INTRAMUSCULAR | Status: AC
  Administered 2011-05-06: 4 mg via INTRAVENOUS
  Filled 2011-05-06: qty 1

## 2011-05-06 MED ORDER — BENZONATATE 100 MG PO CAPS
100.0000 mg | ORAL_CAPSULE | Freq: Three times a day (TID) | ORAL | Status: DC | PRN
  Filled 2011-05-06: qty 1

## 2011-05-06 MED ORDER — ACETAMINOPHEN 325 MG PO TABS: 650.0000 mg | ORAL_TABLET | ORAL | Status: DC | PRN

## 2011-05-06 MED ORDER — ENOXAPARIN SODIUM 40 MG/0.4ML ~~LOC~~ SOLN
40.0000 mg | SUBCUTANEOUS | Status: DC
Start: 2011-05-06 — End: 2011-05-09
  Administered 2011-05-06 – 2011-05-08 (×3): 40 mg via SUBCUTANEOUS
  Filled 2011-05-06 (×4): qty 0.4

## 2011-05-06 MED ORDER — ASPIRIN 81 MG PO CHEW: 324.0000 mg | CHEWABLE_TABLET | ORAL | Status: AC

## 2011-05-06 MED ORDER — ALBUTEROL SULFATE HFA 108 (90 BASE) MCG/ACT IN AERS
2.0000 | INHALATION_SPRAY | Freq: Four times a day (QID) | RESPIRATORY_TRACT | Status: DC | PRN
  Administered 2011-05-06: 2 via RESPIRATORY_TRACT
  Filled 2011-05-06: qty 6.7

## 2011-05-06 MED ORDER — NITROGLYCERIN 0.4 MG SL SUBL
0.4000 mg | SUBLINGUAL_TABLET | SUBLINGUAL | Status: DC | PRN
  Administered 2011-05-07 (×2): 0.4 mg via SUBLINGUAL
  Filled 2011-05-06: qty 25
  Filled 2011-05-06: qty 75

## 2011-05-06 MED ORDER — NITROGLYCERIN 0.4 MG SL SUBL
0.4000 mg | SUBLINGUAL_TABLET | SUBLINGUAL | Status: AC | PRN
  Administered 2011-05-06 (×2): 0.4 mg via SUBLINGUAL
  Administered 2011-05-06: 18:00:00 via SUBLINGUAL
  Administered 2011-05-06 (×2): 0.4 mg via SUBLINGUAL

## 2011-05-06 MED ORDER — HYDROCODONE-ACETAMINOPHEN 5-325 MG PO TABS
1.0000 | ORAL_TABLET | Freq: Four times a day (QID) | ORAL | Status: DC | PRN
  Administered 2011-05-06 – 2011-05-09 (×5): 2 via ORAL
  Filled 2011-05-06 (×5): qty 2

## 2011-05-06 MED ORDER — BIOTENE DRY MOUTH MT LIQD
15.0000 mL | Freq: Two times a day (BID) | OROMUCOSAL | Status: DC
  Administered 2011-05-06 – 2011-05-08 (×4): 15 mL via OROMUCOSAL

## 2011-05-06 MED ORDER — BUDESONIDE 0.5 MG/2ML IN SUSP
0.5000 mg | Freq: Two times a day (BID) | RESPIRATORY_TRACT | Status: DC
  Administered 2011-05-06 – 2011-05-09 (×5): 0.5 mg via RESPIRATORY_TRACT
  Filled 2011-05-06 (×7): qty 2

## 2011-05-06 MED ORDER — PANTOPRAZOLE SODIUM 40 MG PO TBEC
40.0000 mg | DELAYED_RELEASE_TABLET | Freq: Every day | ORAL | Status: DC
  Administered 2011-05-06 – 2011-05-09 (×4): 40 mg via ORAL
  Filled 2011-05-06 (×4): qty 1

## 2011-05-06 MED ORDER — ONDANSETRON HCL 8 MG PO TABS
4.0000 mg | ORAL_TABLET | Freq: Every day | ORAL | Status: DC | PRN
  Filled 2011-05-06: qty 0.5

## 2011-05-06 MED ORDER — ONDANSETRON HCL 4 MG/2ML IJ SOLN: 4.0000 mg | Freq: Four times a day (QID) | INTRAMUSCULAR | Status: DC | PRN

## 2011-05-06 MED ORDER — TRAMADOL HCL 50 MG PO TABS
50.0000 mg | ORAL_TABLET | Freq: Two times a day (BID) | ORAL | Status: DC | PRN
  Administered 2011-05-06: 50 mg via ORAL
  Filled 2011-05-06: qty 1

## 2011-05-06 MED ORDER — SODIUM CHLORIDE 0.9 % IV SOLN
INTRAVENOUS | Status: DC
  Administered 2011-05-06 – 2011-05-07 (×2): via INTRAVENOUS

## 2011-05-06 MED ORDER — METFORMIN HCL 500 MG PO TABS: 500.0000 mg | ORAL_TABLET | Freq: Two times a day (BID) | ORAL | Status: DC

## 2011-05-06 MED ORDER — SODIUM CHLORIDE 0.9 % IJ SOLN: 3.0000 mL | INTRAMUSCULAR | Status: DC | PRN

## 2011-05-06 MED ORDER — CARVEDILOL 6.25 MG PO TABS
6.2500 mg | ORAL_TABLET | Freq: Two times a day (BID) | ORAL | Status: DC
  Administered 2011-05-06 – 2011-05-09 (×5): 6.25 mg via ORAL
  Filled 2011-05-06 (×8): qty 1

## 2011-05-06 NOTE — H&P (Signed)
[name] is an 61 y.o. female.   Chief Complaint: Chest pain and shortness of breath. HPI: 61 years old black female with left precordial chest pain and shortness of breath x 1 day. Some of the chest pain is reproducible by palpation. Chronic history of nausea. No fever. Occasional cough.  Past Medical History  Diagnosis Date  . History of colonic polyps   . GERD (gastroesophageal reflux disease)   . Hyperlipidemia   . Diabetes mellitus without mention of complication   . Hypertension   . Myocardial infarction   . Coronary artery disease   . Fatty liver disease, nonalcoholic   . Colon polyp   . Anemia   . Angina   . Shortness of breath   . Asthma       Past Surgical History  Procedure Date  . Right colectomy     After colonic perforation  . Tubal ligation   . Coronary angioplasty with stent placement     Family History  Problem Relation Age of Onset  . Heart disease Mother   . Diabetes Mother     Sister also  . Colon cancer Neg Hx    Social History:  reports that she has been smoking.  She quit smokeless tobacco use about 13 months ago. She reports that she does not drink alcohol or use illicit drugs.  Allergies: No Known Allergies  Medications Prior to Admission  Medication Dose Route Frequency Provider Last Rate Last Dose  . 0.9 %  sodium chloride infusion  1,000 mL Intravenous Continuous Mylinda Latina III, MD 125 mL/hr at 05/06/11 1433 1,000 mL at 05/06/11 1433  . 0.9 %  sodium chloride infusion   Intravenous Continuous Mylinda Latina III, MD      . albuterol (PROVENTIL HFA;VENTOLIN HFA) 108 (90 BASE) MCG/ACT inhaler 2 puff  2 puff Inhalation Q6H PRN Birdie Riddle, MD      . amLODipine (NORVASC) tablet 5 mg  5 mg Oral Daily Birdie Riddle, MD      . aspirin chewable tablet 324 mg  324 mg Oral Once Mylinda Latina III, MD   324 mg at 05/06/11 1433  . benzonatate (TESSALON) capsule 100 mg  100 mg Oral TID PRN Birdie Riddle, MD      . budesonide (PULMICORT)  nebulizer solution 0.5 mg  0.5 mg Nebulization BID Birdie Riddle, MD      . cloNIDine (CATAPRES) tablet 0.1 mg  0.1 mg Oral BID Birdie Riddle, MD      . famotidine (PEPCID) tablet 20 mg  20 mg Oral BID Birdie Riddle, MD      . insulin glargine (LANTUS) injection 40 Units  40 Units Subcutaneous QHS Birdie Riddle, MD      . irbesartan (AVAPRO) tablet 150 mg  150 mg Oral Daily Birdie Riddle, MD      . LORazepam (ATIVAN) injection 1 mg  1 mg Intravenous Once Mylinda Latina III, MD   1 mg at 05/06/11 1605  . metFORMIN (GLUCOPHAGE) tablet 500 mg  500 mg Oral BID WC Birdie Riddle, MD      . morphine 4 MG/ML injection 4 mg  4 mg Intravenous Once Mylinda Latina III, MD   4 mg at 05/06/11 1433  . nitroGLYCERIN (NITROSTAT) 0.4 MG SL tablet           . nitroGLYCERIN (NITROSTAT) SL tablet 0.4 mg  0.4 mg Sublingual Q5 min PRN Katy Apo, MD  0.4 mg at 05/06/11 1445  . ondansetron (ZOFRAN) injection 4 mg  4 mg Intravenous Once Mylinda Latina III, MD   4 mg at 05/06/11 1433  . ondansetron (ZOFRAN) tablet 4 mg  4 mg Oral Daily PRN Birdie Riddle, MD      . pantoprazole (PROTONIX) EC tablet 40 mg  40 mg Oral Q1200 Birdie Riddle, MD      . traMADol Veatrice Bourbon) tablet 50 mg  50 mg Oral BID PRN Birdie Riddle, MD       Medications Prior to Admission  Medication Sig Dispense Refill  . amLODipine (NORVASC) 5 MG tablet Take 5 mg by mouth daily.        Marland Kitchen aspirin 81 MG tablet Take 81 mg by mouth daily.        Marland Kitchen atorvastatin (LIPITOR) 20 MG tablet Take 20 mg by mouth daily.      . budesonide (PULMICORT) 0.5 MG/2ML nebulizer solution Take 2 mLs (0.5 mg total) by nebulization 2 (two) times daily.  200 mL  3  . cloNIDine (CATAPRES) 0.1 MG tablet Take 1 tablet (0.1 mg total) by mouth 2 (two) times daily.  60 tablet  0  . famotidine (PEPCID) 20 MG tablet Take 1 tablet (20 mg total) by mouth 2 (two) times daily.  60 tablet  3  . irbesartan (AVAPRO) 150 MG tablet Take 150 mg by mouth daily.      . metFORMIN  (GLUCOPHAGE) 500 MG tablet Take 500 mg by mouth 2 (two) times daily with a meal.      . nitroGLYCERIN (NITROSTAT) 0.4 MG SL tablet Place 0.4 mg under the tongue every 5 (five) minutes as needed. As needed       . omeprazole (PRILOSEC) 20 MG capsule Take 1 capsule by mouth every day 30 minutes before breakfast.  30 capsule  0  . albuterol (PROVENTIL HFA;VENTOLIN HFA) 108 (90 BASE) MCG/ACT inhaler Inhale 2 puffs into the lungs every 6 (six) hours as needed. For shortness of breath      . ondansetron (ZOFRAN) 4 MG tablet Take 4 mg by mouth daily as needed. As needed for nausea        Results for orders placed during the hospital encounter of 05/06/11 (from the past 48 hour(s))  CBC     Status: Abnormal   Collection Time   05/06/11  2:32 PM      Component Value Range Comment   WBC 13.1 (*) 4.0 - 10.5 (K/uL)    RBC 4.37  3.87 - 5.11 (MIL/uL)    Hemoglobin 11.9 (*) 12.0 - 15.0 (g/dL)    HCT 38.8  36.0 - 46.0 (%)    MCV 88.8  78.0 - 100.0 (fL)    MCH 27.2  26.0 - 34.0 (pg)    MCHC 30.7  30.0 - 36.0 (g/dL)    RDW 17.6 (*) 11.5 - 15.5 (%)    Platelets 386  150 - 400 (K/uL)   DIFFERENTIAL     Status: Abnormal   Collection Time   05/06/11  2:32 PM      Component Value Range Comment   Neutrophils Relative 72  43 - 77 (%)    Lymphocytes Relative 16  12 - 46 (%)    Monocytes Relative 11  3 - 12 (%)    Eosinophils Relative 1  0 - 5 (%)    Basophils Relative 0  0 - 1 (%)    Neutro Abs 9.5 (*) 1.7 - 7.7 (  K/uL)    Lymphs Abs 2.1  0.7 - 4.0 (K/uL)    Monocytes Absolute 1.4 (*) 0.1 - 1.0 (K/uL)    Eosinophils Absolute 0.1  0.0 - 0.7 (K/uL)    Basophils Absolute 0.0  0.0 - 0.1 (K/uL)    Smear Review MORPHOLOGY UNREMARKABLE     COMPREHENSIVE METABOLIC PANEL     Status: Abnormal   Collection Time   05/06/11  2:32 PM      Component Value Range Comment   Sodium 139  135 - 145 (mEq/L)    Potassium 4.2  3.5 - 5.1 (mEq/L)    Chloride 101  96 - 112 (mEq/L)    CO2 25  19 - 32 (mEq/L)    Glucose, Bld 181  (*) 70 - 99 (mg/dL)    BUN 11  6 - 23 (mg/dL)    Creatinine, Ser 0.96  0.50 - 1.10 (mg/dL)    Calcium 10.3  8.4 - 10.5 (mg/dL)    Total Protein 8.2  6.0 - 8.3 (g/dL)    Albumin 3.8  3.5 - 5.2 (g/dL)    AST 20  0 - 37 (U/L)    ALT 21  0 - 35 (U/L)    Alkaline Phosphatase 129 (*) 39 - 117 (U/L)    Total Bilirubin 0.6  0.3 - 1.2 (mg/dL)    GFR calc non Af Amer 62 (*) >90 (mL/min)    GFR calc Af Amer 71 (*) >90 (mL/min)   PROTIME-INR     Status: Normal   Collection Time   05/06/11  2:32 PM      Component Value Range Comment   Prothrombin Time 12.9  11.6 - 15.2 (seconds)    INR 0.95  0.00 - 1.49    APTT     Status: Normal   Collection Time   05/06/11  2:32 PM      Component Value Range Comment   aPTT 31  24 - 37 (seconds)   POCT I-STAT TROPONIN I     Status: Normal   Collection Time   05/06/11  2:51 PM      Component Value Range Comment   Troponin i, poc 0.00  0.00 - 0.08 (ng/mL)    Comment 3            POCT I-STAT, CHEM 8     Status: Abnormal   Collection Time   05/06/11  2:53 PM      Component Value Range Comment   Sodium 141  135 - 145 (mEq/L)    Potassium 4.1  3.5 - 5.1 (mEq/L)    Chloride 104  96 - 112 (mEq/L)    BUN 11  6 - 23 (mg/dL)    Creatinine, Ser 1.10  0.50 - 1.10 (mg/dL)    Glucose, Bld 179 (*) 70 - 99 (mg/dL)    Calcium, Ion 1.24  1.12 - 1.32 (mmol/L)    TCO2 26  0 - 100 (mmol/L)    Hemoglobin 13.9  12.0 - 15.0 (g/dL)    HCT 41.0  36.0 - 46.0 (%)   URINALYSIS, ROUTINE W REFLEX MICROSCOPIC     Status: Abnormal   Collection Time   05/06/11  3:57 PM      Component Value Range Comment   Color, Urine YELLOW  YELLOW     APPearance CLOUDY (*) CLEAR     Specific Gravity, Urine 1.019  1.005 - 1.030     pH 6.5  5.0 - 8.0     Glucose,  UA NEGATIVE  NEGATIVE (mg/dL)    Hgb urine dipstick NEGATIVE  NEGATIVE     Bilirubin Urine NEGATIVE  NEGATIVE     Ketones, ur NEGATIVE  NEGATIVE (mg/dL)    Protein, ur NEGATIVE  NEGATIVE (mg/dL)    Urobilinogen, UA 1.0  0.0 - 1.0  (mg/dL)    Nitrite NEGATIVE  NEGATIVE     Leukocytes, UA LARGE (*) NEGATIVE    URINE MICROSCOPIC-ADD ON     Status: Abnormal   Collection Time   05/06/11  3:57 PM      Component Value Range Comment   Squamous Epithelial / LPF FEW (*) RARE     WBC, UA 11-20  <3 (WBC/hpf)    RBC / HPF 0-2  <3 (RBC/hpf)    Bacteria, UA RARE  RARE     Dg Chest Portable 1 View  05/06/2011  *RADIOLOGY REPORT*  Clinical Data: Chest pain, shortness of breath.  PORTABLE CHEST - 1 VIEW  Comparison: 03/19/2011  Findings: Atheromatous aortic arch.  Lungs clear.  Heart size upper limits normal.  No effusion.  Regional bones unremarkable.  IMPRESSION:  1.  No acute disease.  Original Report Authenticated By: Dillard Cannon III, M.D.    @ROS @ No weight gain or loss. HENT: Negative.   Respiratory: Positive for shortness of breath. No hemoptysis Cardiovascular: Positive for chest pain. No leg edema Gastrointestinal: Negative. + GERD. Genitourinary: Negative.  Musculoskeletal: Negative.  Skin: Negative.  Neurological: Negative.  Psychiatric/Behavioral: Negative.    Blood pressure 147/77, pulse 92, temperature 98.5 F (36.9 C), temperature source Oral, resp. rate 19, height 4\' 11"  (1.499 m), weight 66.225 kg (146 lb), SpO2 100.00%.   Assessment/Plan Averagely built and nourished black female in mild distress.  HENT: Head: Normocephalic and atraumatic. Brown eyes, Conj-pink, Sclera- Non-icteric Mouth/Throat: Oropharynx is clear and moist.  Neck: Normal range of motion. Neck supple. No JVD. Cardiovascular: Normal rate, regular rhythm and normal heart sounds. II/VI systolic murmur.   Pulmonary/Chest: Effort normal and breath sounds normal.  Abdominal: Soft. Bowel sounds are normal.  Musculoskeletal: Normal range of motion. She exhibits no edema and no tenderness.  Neurological: She is alert and oriented to person, place, and time. Moves all 4 extremities. Skin: Skin is warm and dry.  Psychiatric:  Anxious.  Plan: Admit to step-down. R/O MI.    Elea Holtzclaw S 05/06/2011, 4:56 PM

## 2011-05-06 NOTE — ED Notes (Signed)
Pt is having active CP, unit [id number] per policy cannot accept this pt, Hospitalist & flow manager notified, bed request in process for a step down unit

## 2011-05-06 NOTE — ED Provider Notes (Signed)
History     CSN: [id number]  Arrival date & time 05/06/11  1355   First MD Initiated Contact with Patient 05/06/11 1413      Chief Complaint  Patient presents with  . Chest Pain    sob  . Shortness of Breath    (Consider location/radiation/quality/duration/timing/severity/associated sxs/prior treatment) HPI Comments: Patient is a 61 year old woman who presents with chest pain. She has had this intermittently since 10 this morning. She also notes shortness of breath. In addition she has a bad headache. She took no medications for her chest pain. Review of her charts shows she has diabetes, hypertension, and gastroesophageal reflux disease. She was admitted for shortness of breath and chest pain in February 2013, and at that time was found not to have a heart attack.  She had had a cardiac cath on February 18, 2011, that showed patent arteries and stents.  Patient is a 61 y.o. female presenting with chest pain. The history is provided by the patient and medical records. No language interpreter was used.  Chest Pain The chest pain began 3 - 5 hours ago. Episode Length: A constant pain felt in the left upper chest.   Chest pain occurs constantly. The chest pain is unchanged. Associated with: Nothing. At its most intense, the pain is at 10/10. The pain is currently at 10/10. The quality of the pain is described as pressure-like. The pain does not radiate. Primary symptoms include shortness of breath.  Associated symptoms include diaphoresis. She tried nothing for the symptoms. Risk factors include being elderly, obesity and stress (Known coronary artery disease.).  Her past medical history is significant for CAD, diabetes, hypertension and MI.  Procedure history is positive for cardiac catheterization.     Past Medical History  Diagnosis Date  . History of colonic polyps   . GERD (gastroesophageal reflux disease)   . Hyperlipidemia   . Diabetes mellitus without mention of complication   .  Hypertension   . Myocardial infarction   . Coronary artery disease   . Fatty liver disease, nonalcoholic   . Colon polyp   . Anemia   . Angina   . Shortness of breath   . Asthma     Past Surgical History  Procedure Date  . Right colectomy     After colonic perforation  . Tubal ligation   . Coronary angioplasty with stent placement     Family History  Problem Relation Age of Onset  . Heart disease Mother   . Diabetes Mother     Sister also  . Colon cancer Neg Hx     History  Substance Use Topics  . Smoking status: Current Everyday Smoker -- 0.1 packs/day for 40 years  . Smokeless tobacco: Former Systems developer    Quit date: 03/18/2010  . Alcohol Use: No    OB History    Grav Para Term Preterm Abortions TAB SAB Ect Mult Living                  Review of Systems  Constitutional: Positive for diaphoresis.  HENT: Negative.   Eyes: Negative.   Respiratory: Positive for shortness of breath.   Cardiovascular: Positive for chest pain.  Gastrointestinal: Negative.   Genitourinary: Negative.   Musculoskeletal: Negative.   Skin: Negative.   Neurological: Negative.   Psychiatric/Behavioral: Negative.     Allergies  Review of patient's allergies indicates no known allergies.  Home Medications   Current Outpatient Rx  Name Route Sig Dispense Refill  .  AMLODIPINE BESYLATE 5 MG PO TABS Oral Take 5 mg by mouth daily.      . ASPIRIN 81 MG PO TABS Oral Take 81 mg by mouth daily.      . ATORVASTATIN CALCIUM 20 MG PO TABS Oral Take 20 mg by mouth daily.    Marland Kitchen BENZONATATE 100 MG PO CAPS Oral Take 100 mg by mouth 3 (three) times daily as needed. For cough    . BUDESONIDE 0.5 MG/2ML IN SUSP Nebulization Take 2 mLs (0.5 mg total) by nebulization 2 (two) times daily. 200 mL 3  . CLONIDINE HCL 0.1 MG PO TABS Oral Take 1 tablet (0.1 mg total) by mouth 2 (two) times daily. 60 tablet 0  . FAMOTIDINE 20 MG PO TABS Oral Take 1 tablet (20 mg total) by mouth 2 (two) times daily. 60 tablet 3  .  INSULIN GLARGINE 100 UNIT/ML [place] SOLN Subcutaneous Inject 40 Units into the skin at bedtime.    . IRBESARTAN 150 MG PO TABS Oral Take 150 mg by mouth daily.    Marland Kitchen METFORMIN HCL 500 MG PO TABS Oral Take 500 mg by mouth 2 (two) times daily with a meal.    . NITROGLYCERIN 0.4 MG SL SUBL Sublingual Place 0.4 mg under the tongue every 5 (five) minutes as needed. As needed     . OMEPRAZOLE 20 MG PO CPDR  Take 1 capsule by mouth every day 30 minutes before breakfast. 30 capsule 0  . TRAMADOL HCL 50 MG PO TABS Oral Take 50 mg by mouth 2 (two) times daily as needed. For pain    . ALBUTEROL SULFATE HFA 108 (90 BASE) MCG/ACT IN AERS Inhalation Inhale 2 puffs into the lungs every 6 (six) hours as needed. For shortness of breath    . ONDANSETRON HCL 4 MG PO TABS Oral Take 4 mg by mouth daily as needed. As needed for nausea      BP 133/79  Temp(Src) 98.5 F (36.9 C) (Oral)  Resp 24  SpO2 97%  Physical Exam  Nursing note and vitals reviewed. Constitutional: She is oriented to person, place, and time.       Anxious and tearful late middle-aged woman, complaining of pain in her left upper anterior chest.  HENT:  Head: Normocephalic and atraumatic.  Right Ear: External ear normal.  Left Ear: External ear normal.  Mouth/Throat: Oropharynx is clear and moist.  Eyes: Conjunctivae and EOM are normal. Pupils are equal, round, and reactive to light.  Neck: Normal range of motion. Neck supple.  Cardiovascular: Normal rate, regular rhythm and normal heart sounds.   Pulmonary/Chest: Effort normal and breath sounds normal.  Abdominal: Soft. Bowel sounds are normal.  Musculoskeletal: Normal range of motion. She exhibits no edema and no tenderness.  Neurological: She is alert and oriented to person, place, and time.       No sensory or motor deficits.  Skin: Skin is warm and dry.  Psychiatric:       Patient is agitated, tearful.    ED Course  Procedures (including critical care time)   Labs Reviewed  CBC    DIFFERENTIAL  BASIC METABOLIC PANEL   [id number] PM  Date: 05/06/2011  Rate: 121  Rhythm: sinus tachycardia  QRS Axis: left  Intervals: normal QRS:  Poor R wave progression in precordial leads suggests possible old anterior myocardial infarction.  ST/T Wave abnormalities: normal  Conduction Disutrbances:none  Narrative Interpretation: Abnormal EKG  Old EKG Reviewed: unchanged  2:39 PM Pt was seen  and had physical examination.  EKG was non-acute.  Lab workup was ordered.  IV fluids, oxygen, aspirin and sublingual nitroglycerin were ordered.  IV morpine and Zofran was ordered.  Old charts were reviewed.  3:50 PM Results for orders placed during the hospital encounter of 05/06/11  CBC      Component Value Range   WBC 13.1 (*) 4.0 - 10.5 (K/uL)   RBC 4.37  3.87 - 5.11 (MIL/uL)   Hemoglobin 11.9 (*) 12.0 - 15.0 (g/dL)   HCT 38.8  36.0 - 46.0 (%)   MCV 88.8  78.0 - 100.0 (fL)   MCH 27.2  26.0 - 34.0 (pg)   MCHC 30.7  30.0 - 36.0 (g/dL)   RDW 17.6 (*) 11.5 - 15.5 (%)   Platelets 386  150 - 400 (K/uL)  DIFFERENTIAL      Component Value Range   Neutrophils Relative PENDING  43 - 77 (%)   Neutro Abs PENDING  1.7 - 7.7 (K/uL)   Band Neutrophils PENDING  0 - 10 (%)   Lymphocytes Relative PENDING  12 - 46 (%)   Lymphs Abs PENDING  0.7 - 4.0 (K/uL)   Monocytes Relative PENDING  3 - 12 (%)   Monocytes Absolute PENDING  0.1 - 1.0 (K/uL)   Eosinophils Relative PENDING  0 - 5 (%)   Eosinophils Absolute PENDING  0.0 - 0.7 (K/uL)   Basophils Relative PENDING  0 - 1 (%)   Basophils Absolute PENDING  0.0 - 0.1 (K/uL)   WBC Morphology PENDING     RBC Morphology PENDING     Smear Review PENDING     nRBC PENDING  0 (/100 WBC)   Metamyelocytes Relative PENDING     Myelocytes PENDING     Promyelocytes Absolute PENDING     Blasts PENDING    COMPREHENSIVE METABOLIC PANEL      Component Value Range   Sodium 139  135 - 145 (mEq/L)   Potassium 4.2  3.5 - 5.1 (mEq/L)   Chloride 101  96 - 112  (mEq/L)   CO2 25  19 - 32 (mEq/L)   Glucose, Bld 181 (*) 70 - 99 (mg/dL)   BUN 11  6 - 23 (mg/dL)   Creatinine, Ser 0.96  0.50 - 1.10 (mg/dL)   Calcium 10.3  8.4 - 10.5 (mg/dL)   Total Protein 8.2  6.0 - 8.3 (g/dL)   Albumin 3.8  3.5 - 5.2 (g/dL)   AST 20  0 - 37 (U/L)   ALT 21  0 - 35 (U/L)   Alkaline Phosphatase 129 (*) 39 - 117 (U/L)   Total Bilirubin 0.6  0.3 - 1.2 (mg/dL)   GFR calc non Af Amer 62 (*) >90 (mL/min)   GFR calc Af Amer 71 (*) >90 (mL/min)  PROTIME-INR      Component Value Range   Prothrombin Time 12.9  11.6 - 15.2 (seconds)   INR 0.95  0.00 - 1.49   APTT      Component Value Range   aPTT 31  24 - 37 (seconds)  POCT I-STAT, CHEM 8      Component Value Range   Sodium 141  135 - 145 (mEq/L)   Potassium 4.1  3.5 - 5.1 (mEq/L)   Chloride 104  96 - 112 (mEq/L)   BUN 11  6 - 23 (mg/dL)   Creatinine, Ser 1.10  0.50 - 1.10 (mg/dL)   Glucose, Bld 179 (*) 70 - 99 (mg/dL)   Calcium, Ion  1.24  1.12 - 1.32 (mmol/L)   TCO2 26  0 - 100 (mmol/L)   Hemoglobin 13.9  12.0 - 15.0 (g/dL)   HCT 41.0  36.0 - 46.0 (%)  POCT I-STAT TROPONIN I      Component Value Range   Troponin i, poc 0.00  0.00 - 0.08 (ng/mL)   Comment 3            Dg Chest Portable 1 View  05/06/2011  *RADIOLOGY REPORT*  Clinical Data: Chest pain, shortness of breath.  PORTABLE CHEST - 1 VIEW  Comparison: 03/19/2011  Findings: Atheromatous aortic arch.  Lungs clear.  Heart size upper limits normal.  No effusion.  Regional bones unremarkable.  IMPRESSION:  1.  No acute disease.  Original Report Authenticated By: Trecia Rogers, M.D.    Initial EKG and troponin I negative.  Pt still crying and complaining of chest pain.  Will give Ativan 1 mg IV for her anxiety.  Call to internal medicine to admit her for chest pain ruleout.  Case discussed with Dr. Wray Kearns --> admit to a telemetry unit, Team 3.    1. Chest pain           Mylinda Latina III, MD 05/06/11 1606

## 2011-05-06 NOTE — ED Notes (Signed)
Pt states that since 10am today she has been having severe substernal/left sided chest pain and shortness of breath. She denies any n/v/d but states that she does have a severe headache. No meds pta. Pt's pcp cardiologist is dr.[email or web address].

## 2011-05-06 NOTE — Progress Notes (Signed)
PHARMACIST - PHYSICIAN COMMUNICATION  CONCERNING:  METFORMIN SAFE ADMINISTRATION POLICY  RECOMMENDATION: Metformin has been placed on DISCONTINUE (rejected order) STATUS and should be reordered only after any of the conditions below are ruled out.  Current safety recommendations include avoiding metformin for a minimum of 48 hours after the patient's exposure to intravenous contrast media.  DESCRIPTION:  The Pharmacy Committee has adopted a policy that restricts the use of metformin in hospitalized patients until all the contraindications to administration have been ruled out. Specific contraindications are: []  Serum creatinine ? 1.5 for males []  Serum creatinine ? 1.4 for females [x]  Shock, acute MI, sepsis, hypoxemia, dehydration [x]  Planned administration of intravenous iodinated contrast media []  Heart Failure patients with low EF []  Acute or chronic metabolic acidosis (including DKA)

## 2011-05-07 ENCOUNTER — Other Ambulatory Visit: Payer: Self-pay

## 2011-05-07 LAB — LIPID PANEL
LDL Cholesterol: 30 mg/dL (ref 0–99)
Triglycerides: 157 mg/dL — ABNORMAL HIGH (ref <150)

## 2011-05-07 LAB — GLUCOSE, CAPILLARY

## 2011-05-07 LAB — CBC
Hemoglobin: 9.7 g/dL — ABNORMAL LOW (ref 12.0–15.0)
RBC: 3.59 MIL/uL — ABNORMAL LOW (ref 3.87–5.11)

## 2011-05-07 LAB — BASIC METABOLIC PANEL
CO2: 24 mEq/L (ref 19–32)
GFR calc non Af Amer: 53 mL/min — ABNORMAL LOW (ref >90)
Glucose, Bld: 196 mg/dL — ABNORMAL HIGH (ref 70–99)
Potassium: 4.3 mEq/L (ref 3.5–5.1)
Sodium: 138 mEq/L (ref 135–145)

## 2011-05-07 LAB — PROTIME-INR: INR: 0.96 (ref 0.00–1.49)

## 2011-05-07 LAB — CARDIAC PANEL(CRET KIN+CKTOT+MB+TROPI)
Relative Index: INVALID (ref 0.0–2.5)
Troponin I: <0.3 ng/mL (ref <0.30)

## 2011-05-07 NOTE — Progress Notes (Signed)
Subjective:  Patient complains of vague chest pain and left arm pain. States arm pain increases with movement  Objective:  Vital Signs in the last 24 hours: Temp:  [98.1 F (36.7 C)-98.8 F (37.1 C)] 98.4 F (36.9 C) (04/01 1214) Pulse Rate:  [58-126] 58  (04/01 1214) Resp:  [11-27] 12  (04/01 1214) BP: (93-158)/(40-95) 97/53 mmHg (04/01 1214) SpO2:  [97 %-100 %] 99 % (04/01 1214) Weight:  [64.1 kg (141 lb 5 oz)-66.225 kg (146 lb)] 65.4 kg (144 lb 2.9 oz) (04/01 0500)  Intake/Output from previous day: 03/31 0701 - 04/01 0700 In: 480 [I.V.:480] Out: 350 [Urine:350] Intake/Output from this shift: Total I/O In: 400 [P.O.:240; I.V.:160] Out: -   Physical Exam: Neck: no adenopathy, no carotid bruit, no JVD and supple, symmetrical, trachea midline Lungs: clear to auscultation bilaterally Heart: regular rate and rhythm, S1, S2 normal, no murmur, click, rub or gallop Abdomen: soft, non-tender; bowel sounds normal; no masses,  no organomegaly Extremities: extremities normal, atraumatic, no cyanosis or edema  Lab Results:  Basename 05/07/11 0450 05/06/11 2031  WBC 8.6 10.5  HGB 9.7* 10.5*  PLT 330 326    Basename 05/07/11 0450 05/06/11 2031 05/06/11 1453 05/06/11 1432  NA 138 -- 141 --  K 4.3 -- 4.1 --  CL 105 -- 104 --  CO2 24 -- -- 25  GLUCOSE 196* -- 179* --  BUN 14 -- 11 --  CREATININE 1.09 0.98 -- --    Basename 05/07/11 0450 05/06/11 2359  TROPONINI <0.30 <0.30   Hepatic Function Panel  Basename 05/06/11 1432  PROT 8.2  ALBUMIN 3.8  AST 20  ALT 21  ALKPHOS 129*  BILITOT 0.6  BILIDIR --  IBILI --    Basename 05/07/11 0450  CHOL 91   No results found for this basename: PROTIME in the last 72 hours  Imaging: Imaging results have been reviewed and Dg Chest Portable 1 View  05/06/2011  *RADIOLOGY REPORT*  Clinical Data: Chest pain, shortness of breath.  PORTABLE CHEST - 1 VIEW  Comparison: 03/19/2011  Findings: Atheromatous aortic arch.  Lungs clear.   Heart size upper limits normal.  No effusion.  Regional bones unremarkable.  IMPRESSION:  1.  No acute disease.  Original Report Authenticated By: Trecia Rogers, M.D.    Cardiac Studies:  Assessment/Plan:  Atypical chest pain MI ruled out Coronary artery disease status post PCI to left circumflex and RCA in the past stable Hypertension Non-insulin-dependent diabetes matters Hypercholesteremia Chronic anemia History of bronchial asthma History of tobacco abuse History of colonic polyp Plan Continue present management DC clonidine in view of hypertension Check old records   LOS: 1 day    Shavonte Zhao N 05/07/2011, 1:00 PM

## 2011-05-07 NOTE — Progress Notes (Signed)
   CARE MANAGEMENT NOTE 05/07/2011  Patient:  HENRY, TRIAL   Account Number:  [id number]  Date Initiated:  05/07/2011  Documentation initiated by:  GRAVES-BIGELOW,Arnold Kester  Subjective/Objective Assessment:   Pt admitted with CP/ SOB. Pt states she has grandson, however he is in and out of house. Pt states she has an aide from [employer] M-F 8-12 and they prepare breakfast and lunch and she eats microwave dinners at night. Pt states she has RW.     Action/Plan:   Pt will benefit from PT/OT consult for disposition recommendations. Pt had a hard time expressing herself. Pt states she is currently active with [facility] for RN. CM did call [facility] to make them aware.   Anticipated DC Date:  05/09/2011   Anticipated DC Plan:  [place]  CM consult      Choice offered to / List presented to:             Status of service:  In process, will continue to follow Medicare Important Message given?   (If response is "NO", the following Medicare IM given date fields will be blank) Date Medicare IM given:   Date Additional Medicare IM given:    Discharge Disposition:    Per UR Regulation:    If discussed at Long Length of Stay Meetings, dates discussed:    Comments:  05-07-11 Lynn, RN,BSN [phone] Pt will need resumption orders for [facility] RN if that is appropriate for d/c disposition. PT/OT has been consulted for disposition needs.

## 2011-05-07 NOTE — Consult Note (Signed)
P t states she quit using tobacco about a year ago and has remained tobacco free. Congratulated and encouraged pt to remain tobacco free. Referred to 1-800 quit now for f/u and support. Discussed second hand smoke and in home smoking policy. Reviewed and gave pt Written education/contact information.

## 2011-05-07 NOTE — Progress Notes (Signed)
CARDIAC REHAB PHASE I   PRE:  Rate/Rhythm: 67 SR    BP: sitting 97/81    SaO2: 100 2L, 96 RA  MODE:  Ambulation: 104 ft   POST:  Rate/Rhythm: 91 SR    BP: sitting 97/53     SaO2: 96 RA  Pt sts chest pressure is 5-6/10. Sts her left arm hurts also, esp in shoulder. This pain increases when she raises her arm and uses it to pull up on the RW. Pt walked with RW assist x1. Unsteady at times. Seems groggy. Sts she had some dizziness when asked. Difficult to get appropriate answers from pt. Does st that her chest pressure did not change with the walk. Return to recliner. Left off O2 as SaO2 was good and pt did not want to wear it. Will f/u as time allows, pt will be low priority. [id number]  Darrick Meigs CES, ACSM

## 2011-05-07 NOTE — Progress Notes (Signed)
Chaplain responded to a spiritual care consult for the patient. Chaplain had a brief conversation with the patient. Chaplain experienced the patient as very groggy and soft-spoken. Chaplain had difficulty communicating with the patient. Per patient's request, chaplain contacted the patient's pastor, Sherlock Bine(spelling of last name?) of the [employer] in [place]. Chaplain will follow up as needed.

## 2011-05-07 NOTE — Progress Notes (Signed)
Inpatient Diabetes Program Recommendations  AACE/ADA: New Consensus Statement on Inpatient Glycemic Control (2009)  Target Ranges:  Prepandial:   less than 140 mg/dL      Peak postprandial:   less than 180 mg/dL (1-2 hours)      Critically ill patients:  140 - 180 mg/dL   Reason for Visit: Hyperglycemia  Results for [name], [name] (MRN [id number]) as of 05/07/2011 15:32  Ref. Range 05/06/2011 19:39 05/07/2011 07:50 05/07/2011 14:13  Glucose-Capillary Latest Range: 70-99 mg/dL 130 (H) 106 (H) 203 (H)    Inpatient Diabetes Program Recommendations Correction (SSI): Add Novolog sensitive tidwc HgbA1C: HgbA1C to assess glycemic control prior to hospitalization  Note: Add CHO mod medium to heart-healthy diet.

## 2011-05-08 LAB — D-DIMER, QUANTITATIVE: D-Dimer, Quant: 0.26 ug/mL-FEU (ref 0.00–0.48)

## 2011-05-08 LAB — GLUCOSE, CAPILLARY
Glucose-Capillary: 101 mg/dL — ABNORMAL HIGH (ref 70–99)
Glucose-Capillary: 100 mg/dL — ABNORMAL HIGH (ref 70–99)
Glucose-Capillary: 174 mg/dL — ABNORMAL HIGH (ref 70–99)

## 2011-05-08 MED ORDER — LEVALBUTEROL HCL 1.25 MG/0.5ML IN NEBU
1.2500 mg | INHALATION_SOLUTION | Freq: Three times a day (TID) | RESPIRATORY_TRACT | Status: DC
  Administered 2011-05-08 – 2011-05-09 (×4): 1.25 mg via RESPIRATORY_TRACT
  Filled 2011-05-08 (×7): qty 0.5

## 2011-05-08 MED ORDER — ALPRAZOLAM 0.25 MG PO TABS
0.2500 mg | ORAL_TABLET | Freq: Two times a day (BID) | ORAL | Status: DC
  Administered 2011-05-08 – 2011-05-09 (×3): 0.25 mg via ORAL
  Filled 2011-05-08 (×3): qty 1

## 2011-05-08 MED ORDER — INSULIN ASPART 100 UNIT/ML ~~LOC~~ SOLN
0.0000 [IU] | Freq: Three times a day (TID) | SUBCUTANEOUS | Status: DC
  Administered 2011-05-08 – 2011-05-09 (×2): 2 [IU] via SUBCUTANEOUS

## 2011-05-08 NOTE — Progress Notes (Signed)
Physical Therapy Evaluation Patient Details Name: [name] MRN: [id number] DOB: [birth year] Today's Date: 05/08/2011  Problem List:  Patient Active Problem List  Diagnoses  . DM  . HYPERLIPIDEMIA  . HYPERTENSION  . MYOCARDIAL INFARCTION  . CORONARY ARTERY DISEASE  . GERD  . FATTY LIVER DISEASE  . COLONIC POLYPS, HX OF  . Cholelithiasis  . Chest pain at rest  . Acute respiratory failure  . Asthma exacerbation  . Chest pain  . Anemia  . Diabetes mellitus  . COPD (chronic obstructive pulmonary disease)    Past Medical History:  Past Medical History  Diagnosis Date  . History of colonic polyps   . GERD (gastroesophageal reflux disease)   . Hyperlipidemia   . Diabetes mellitus without mention of complication   . Hypertension   . Myocardial infarction   . Coronary artery disease   . Fatty liver disease, nonalcoholic   . Colon polyp   . Anemia   . Angina   . Shortness of breath   . Asthma    Past Surgical History:  Past Surgical History  Procedure Date  . Right colectomy     After colonic perforation  . Tubal ligation   . Coronary angioplasty with stent placement     PT Assessment/Plan/Recommendation PT Recommendation Equipment Recommended: Defer to next venue PT Goals     PT Evaluation Precautions/Restrictions  Precautions Precautions: Fall Required Braces or Orthoses: No Restrictions Weight Bearing Restrictions: No Prior Functioning  Home Living Bathroom Toilet: Standard   Cognition Cognition Arousal/Alertness: Awake/alert Overall Cognitive Status: Appears within functional limits for tasks assessed Sensation/Coordination   Extremity Assessment RUE Assessment RUE Assessment:  (grossly 3/5) LUE Assessment LUE Assessment:  (grossly 3/5) Mobility (including Balance) Bed Mobility Bed Mobility: Yes Supine to Sit: 5: Supervision;With rails;HOB elevated (Comment degrees) (40 degrees) Sitting - Scoot to Edge of Bed: 6: Modified independent  (Device/Increase time) Transfers Sit to Stand: 4: Min assist;With upper extremity assist;From bed Stand to Sit: 4: Min assist;With upper extremity assist;With armrests;To chair/3-in-1    Exercise    End of Session General Behavior During Session: [facility] for tasks performed Cognition: [facility] for tasks performed  Catarina Hartshorn, [place] 05/08/2011, 1:08 PM

## 2011-05-08 NOTE — Progress Notes (Signed)
Patient has been very depressed and crying whole night..Slept on and off.Patient reassured and comforted.

## 2011-05-08 NOTE — Progress Notes (Signed)
CARDIAC REHAB PHASE I   PRE:  Rate/Rhythm: 80SR  BP:  Supine:   Sitting: 119/39  Standing:    SaO2: 100%RA  MODE:  Ambulation: 104 ft   POST:  Rate/Rhythem: 96SR  BP:  Supine:   Sitting: 113/75  Standing:    SaO2: 98%RA 1140-1206 Pt walked 104 ft on RA with rolling walker and asst x 1. C/o chest, right side  And right arm pain during walk. States this pain comes and goes. Gait slow. Tired by end of walk.  To recliner with call bell. Sats good on RA.  Jeani Sow

## 2011-05-08 NOTE — Progress Notes (Signed)
Subjective:  Patient complains of vague localized chest pain associated with shortness of breath. No anginal chest pain. Denies any cough fever or chills  Objective:  Vital Signs in the last 24 hours: Temp:  [97.8 F (36.6 C)-99.6 F (37.6 C)] 98.5 F (36.9 C) (04/02 0700) Pulse Rate:  [58-75] 75  (04/02 0700) Resp:  [10-21] 17  (04/02 0500) BP: (93-121)/(44-63) 121/59 mmHg (04/02 0700) SpO2:  [93 %-99 %] 93 % (04/02 0700)  Intake/Output from previous day: 04/01 0701 - 04/02 0700 In: 2570 [P.O.:720; I.V.:1850] Out: 2100 [Urine:2100] Intake/Output from this shift: Total I/O In: 10 [I.V.:10] Out: -   Physical Exam: Neck: no adenopathy, no carotid bruit, no JVD and supple, symmetrical, trachea midline Lungs: Bilateral expiratory wheezing noted Heart: regular rate and rhythm, S1, S2 normal and no S3 or S4 Abdomen: soft, non-tender; bowel sounds normal; no masses,  no organomegaly Extremities: extremities normal, atraumatic, no cyanosis or edema  Lab Results:  Basename 05/07/11 0450 05/06/11 2031  WBC 8.6 10.5  HGB 9.7* 10.5*  PLT 330 326    Basename 05/07/11 0450 05/06/11 2031 05/06/11 1453 05/06/11 1432  NA 138 -- 141 --  K 4.3 -- 4.1 --  CL 105 -- 104 --  CO2 24 -- -- 25  GLUCOSE 196* -- 179* --  BUN 14 -- 11 --  CREATININE 1.09 0.98 -- --    Basename 05/07/11 0450 05/06/11 2359  TROPONINI <0.30 <0.30   Hepatic Function Panel  Basename 05/06/11 1432  PROT 8.2  ALBUMIN 3.8  AST 20  ALT 21  ALKPHOS 129*  BILITOT 0.6  BILIDIR --  IBILI --    Basename 05/07/11 0450  CHOL 91   No results found for this basename: PROTIME in the last 72 hours  Imaging: Imaging results have been reviewed and Dg Chest Portable 1 View  05/06/2011  *RADIOLOGY REPORT*  Clinical Data: Chest pain, shortness of breath.  PORTABLE CHEST - 1 VIEW  Comparison: 03/19/2011  Findings: Atheromatous aortic arch.  Lungs clear.  Heart size upper limits normal.  No effusion.  Regional bones  unremarkable.  IMPRESSION:  1.  No acute disease.  Original Report Authenticated By: Trecia Rogers, M.D.    Cardiac Studies:  Assessment/Plan:  Atypical chest pain Exacerbation of bronchial asthma Coronary artery disease stable Hypertension Insulin requiring diabetes mellitus Hypercholesteremia Chronic anemia History of tobacco abuse History of colonic polyp Plan Check D. dimers if positive will get CT of the chest to rule out PE Xopenex breathing treatment per orders  LOS: 2 days    Navraj Dreibelbis N 05/08/2011, 9:44 AM

## 2011-05-08 NOTE — Evaluation (Signed)
Occupational Therapy Evaluation Patient Details Name: [name] MRN: [id number] DOB: 1950/08/10 Today's Date: 05/08/2011  Problem List:  Patient Active Problem List  Diagnoses  . DM  . HYPERLIPIDEMIA  . HYPERTENSION  . MYOCARDIAL INFARCTION  . CORONARY ARTERY DISEASE  . GERD  . FATTY LIVER DISEASE  . COLONIC POLYPS, HX OF  . Cholelithiasis  . Chest pain at rest  . Acute respiratory failure  . Asthma exacerbation  . Chest pain  . Anemia  . Diabetes mellitus  . COPD (chronic obstructive pulmonary disease)    Past Medical History:  Past Medical History  Diagnosis Date  . History of colonic polyps   . GERD (gastroesophageal reflux disease)   . Hyperlipidemia   . Diabetes mellitus without mention of complication   . Hypertension   . Myocardial infarction   . Coronary artery disease   . Fatty liver disease, nonalcoholic   . Colon polyp   . Anemia   . Angina   . Shortness of breath   . Asthma    Past Surgical History:  Past Surgical History  Procedure Date  . Right colectomy     After colonic perforation  . Tubal ligation   . Coronary angioplasty with stent placement     OT Assessment/Plan/Recommendation OT Assessment Clinical Impression Statement: This 61 yo female admitted with CP and SOB presents to acute OT with problems below thus affecting pt's PLOF. Will benefit from acute OT with follow up at SNF to get back to PLOF.  OT Recommendation/Assessment: Patient will need skilled OT in the acute care venue OT Problem List: Decreased strength;Decreased activity tolerance;Impaired balance (sitting and/or standing);Decreased knowledge of use of DME or AE;Pain Barriers to Discharge: Decreased caregiver support (Has an aide 5 days/wk for 4 hours) OT Therapy Diagnosis : Generalized weakness;Acute pain OT Plan OT Frequency: Min 2X/week OT Treatment/Interventions: Self-care/ADL training;DME and/or AE instruction;Therapeutic activities;Patient/family education;Balance  training;Energy conservation OT Recommendation Follow Up Recommendations: Skilled nursing facility Equipment Recommended: Defer to next venue Individuals Consulted Consulted and Agree with Results and Recommendations: Patient OT Goals Acute Rehab OT Goals OT Goal Formulation: With patient Time For Goal Achievement: 2 weeks ADL Goals Pt Will Perform Grooming: with set-up;with supervision;Unsupported (sit to stand at sink for 2 tasks) ADL Goal: Grooming - Progress: Goal set today Pt Will Perform Upper Body Bathing: with set-up;Unsupported (sit to stand at sink) ADL Goal: Upper Body Bathing - Progress: Goal set today Pt Will Perform Lower Body Bathing: with set-up;with supervision;Unsupported (sit to stand at sink) ADL Goal: Lower Body Bathing - Progress: Goal set today Pt Will Perform Upper Body Dressing: with set-up;with supervision;Unsupported;Sitting, chair;Sitting, bed ADL Goal: Upper Body Dressing - Progress: Goal set today Pt Will Perform Lower Body Dressing: with set-up;with supervision;Unsupported;Sit to stand from bed;Sit to stand from chair ADL Goal: Lower Body Dressing - Progress: Goal set today Pt Will Transfer to Toilet: with supervision;Ambulation;with DME;Comfort height toilet;Grab bars ADL Goal: Toilet Transfer - Progress: Goal set today Additional ADL Goal #1: Pt will come  up to sit from flat bed, no rail independently as precursor to BADLs. ADL Goal: Additional Goal #1 - Progress: Goal set today Additional ADL Goal #2: Pt will I'ly use pursed lipped breathing during goals above. ADL Goal: Additional Goal #2 - Progress: Goal set today  OT Evaluation Precautions/Restrictions  Precautions Precautions: Fall Required Braces or Orthoses: No Restrictions Weight Bearing Restrictions: No Prior Functioning Home Living Lives With: Alone Receives Help From: Personal care attendant (Aide 4hrs/day Mon-Fri)  Type of Home: House Home Layout: One level Home Access: Stairs to  enter Entrance Stairs-Rails: None Entrance Stairs-Number of Steps: 3 Bathroom Shower/Tub: Product/process development scientist: Standard Home Adaptive Equipment: Bedside commode/3-in-1;Walker - rolling;Straight cane;Shower chair with back Prior Function Level of Independence: Independent with gait;Independent with transfers;Needs assistance with ADLs;Needs assistance with homemaking;Requires assistive device for independence Able to Take Stairs?: Yes Driving: No ADL ADL Eating/Feeding: Simulated;Independent Where Assessed - Eating/Feeding: Chair Grooming: Simulated;Wash/dry hands;Set up Where Assessed - Grooming: Sitting, chair;Supported Upper Body Bathing: Simulated;Supervision/safety;Set up Where Assessed - Upper Body Bathing: Supported;Sitting, chair Lower Body Bathing: Simulated;Minimal assistance Where Assessed - Lower Body Bathing: Supported;Sit to stand from bed Upper Body Dressing: Simulated;Minimal assistance Where Assessed - Upper Body Dressing: Sitting, chair;Unsupported Lower Body Dressing: Simulated;Minimal assistance Where Assessed - Lower Body Dressing: Unsupported;Sit to stand from bed (Pt lost balance x 2 while sitting and doing socks) Toilet Transfer: Simulated;Minimal assistance Toilet Transfer Details (indicate cue type and reason): Bed out into hall back to room to recliner Toilet Transfer Method: Ambulating Toileting - Clothing Manipulation: Simulated;Independent Where Assessed - Toileting Clothing Manipulation: Standing Toileting - Hygiene: Simulated;Independent Tub/Shower Transfer: Not assessed Tub/Shower Transfer Method: Not assessed Equipment Used: Rolling walker Ambulation Related to ADLs: Min A with RW ADL Comments: DOE with doff/don socks, educated pt on pursed lipped breathing Vision/Perception    Cognition Cognition Arousal/Alertness: Awake/alert Overall Cognitive Status: Appears within functional limits for tasks assessed Orientation Level:  Oriented X4 Sensation/Coordination   Extremity Assessment RUE Assessment RUE Assessment:  (grossly 3/5) LUE Assessment LUE Assessment:  (grossly 3/5) Mobility  Bed Mobility Bed Mobility: Yes Supine to Sit: 5: Supervision;With rails;HOB elevated (Comment degrees) (40 degrees) Sitting - Scoot to Edge of Bed: 6: Modified independent (Device/Increase time) Transfers Transfers: Yes Sit to Stand: 4: Min assist;With upper extremity assist;From bed Stand to Sit: 4: Min assist;With upper extremity assist;With armrests;To chair/3-in-1 Exercises   End of Session OT - End of Session Equipment Utilized During Treatment: Gait belt (RW) Activity Tolerance: Patient limited by fatigue;Patient limited by pain Patient left: in chair;with call bell in reach General Behavior During Session: [facility] for tasks performed Cognition: [facility] for tasks performed  Co-eval with PT.   Almon Register [id number] 05/08/2011, 10:25 AM

## 2011-05-09 ENCOUNTER — Encounter (HOSPITAL_COMMUNITY): Payer: Self-pay | Admitting: Dietician

## 2011-05-09 MED ORDER — HYDROCODONE-ACETAMINOPHEN 5-325 MG PO TABS: 1.0000 | ORAL_TABLET | Freq: Four times a day (QID) | ORAL | Status: DC | PRN

## 2011-05-09 MED ORDER — ALPRAZOLAM 0.25 MG PO TABS: 0.2500 mg | ORAL_TABLET | Freq: Two times a day (BID) | ORAL | Status: DC

## 2011-05-09 MED ORDER — CARVEDILOL 6.25 MG PO TABS: 3.1250 mg | ORAL_TABLET | Freq: Two times a day (BID) | ORAL | Status: DC

## 2011-05-09 NOTE — Progress Notes (Signed)
DISCHARGED HOME ACCOMPANIED BY HOME HEALTH AID , STABLE, PRESCRIPTION AND DISCHARGE INSTRUCTIONS  GIVEN TO PT. BELONGINGS TAKEN HOME.

## 2011-05-09 NOTE — Discharge Instructions (Signed)
Chest Pain (Nonspecific) It is often hard to give a specific diagnosis for the cause of chest pain. There is always a chance that your pain could be related to something serious, such as a heart attack or a blood clot in the lungs. You need to follow up with your caregiver for further evaluation. CAUSES   Heartburn.   Pneumonia or bronchitis.   Anxiety or stress.   Inflammation around your heart (pericarditis) or lung (pleuritis or pleurisy).   A blood clot in the lung.   A collapsed lung (pneumothorax). It can develop suddenly on its own (spontaneous pneumothorax) or from injury (trauma) to the chest.   Shingles infection (herpes zoster virus).  The chest wall is composed of bones, muscles, and cartilage. Any of these can be the source of the pain.  The bones can be bruised by injury.   The muscles or cartilage can be strained by coughing or overwork.   The cartilage can be affected by inflammation and become sore (costochondritis).  DIAGNOSIS  Lab tests or other studies, such as X-rays, electrocardiography, stress testing, or cardiac imaging, may be needed to find the cause of your pain.  TREATMENT   Treatment depends on what may be causing your chest pain. Treatment may include:   Acid blockers for heartburn.   Anti-inflammatory medicine.   Pain medicine for inflammatory conditions.   Antibiotics if an infection is present.   You may be advised to change lifestyle habits. This includes stopping smoking and avoiding alcohol, caffeine, and chocolate.   You may be advised to keep your head raised (elevated) when sleeping. This reduces the chance of acid going backward from your stomach into your esophagus.   Most of the time, nonspecific chest pain will improve within 2 to 3 days with rest and mild pain medicine.  HOME CARE INSTRUCTIONS   If antibiotics were prescribed, take your antibiotics as directed. Finish them even if you start to feel better.   For the next few  days, avoid physical activities that bring on chest pain. Continue physical activities as directed.   Do not smoke.   Avoid drinking alcohol.   Only take over-the-counter or prescription medicine for pain, discomfort, or fever as directed by your caregiver.   Follow your caregiver's suggestions for further testing if your chest pain does not go away.   Keep any follow-up appointments you made. If you do not go to an appointment, you could develop lasting (chronic) problems with pain. If there is any problem keeping an appointment, you must call to reschedule.  SEEK MEDICAL CARE IF:   You think you are having problems from the medicine you are taking. Read your medicine instructions carefully.   Your chest pain does not go away, even after treatment.   You develop a rash with blisters on your chest.  SEEK IMMEDIATE MEDICAL CARE IF:   You have increased chest pain or pain that spreads to your arm, neck, jaw, back, or abdomen.   You develop shortness of breath, an increasing cough, or you are coughing up blood.   You have severe back or abdominal pain, feel nauseous, or vomit.   You develop severe weakness, fainting, or chills.   You have a fever.  THIS IS AN EMERGENCY. Do not wait to see if the pain will go away. Get medical help at once. Call your local emergency services (911 in [place] Do not drive yourself to the hospital. MAKE SURE YOU:   Understand these instructions.     Will watch your condition.   Will get help right away if you are not doing well or get worse.  Document Released: 11/01/2004 Document Revised: 01/11/2011 Document Reviewed: 08/28/2007 [employer] Patient Information 2012 [employer]. 

## 2011-05-09 NOTE — Discharge Summary (Signed)
  Discharge summary dictated on 05/09/2011 dictation number is [phone]

## 2011-05-09 NOTE — Progress Notes (Signed)
UR Completed. Simmons, Avriana Joo F [phone]  

## 2011-05-09 NOTE — Progress Notes (Signed)
CARDIAC REHAB PHASE I   PRE:  Rate/Rhythm: 95 SR    BP: sitting 130/61    SaO2:   MODE:  Ambulation: 180 ft   POST:  Rate/Rhythm: 109 ST    BP: sitting 118/63     SaO2:   Pt crying upon entering room. Sts her left side and arm hurt, cannot give a rating of pain. Frustrated that "no one is doing anything for for her." Willing to walk. Assist x1 with RW. Unsteady at times. Did c/o one instance of wooziness. BP lower after walk. Sts her left side/arm pain did not change walking. Only increasing when she lifts her arm. Not tender to touch. Tearful entire walk. Return to chair. [id number]   Darrick Meigs CES, ACSM

## 2011-05-09 NOTE — Discharge Summary (Signed)
[name], PAK NO.:  [id number]  MEDICAL RECORD NO.:  [id number]  LOCATION:  2919                         FACILITY:  [facility]  PHYSICIAN:  Allegra Lai. Terrence Dupont, M.D. DATE OF BIRTH:  [birth year]  DATE OF ADMISSION:  05/06/2011 DATE OF DISCHARGE:  05/09/2011                              DISCHARGE SUMMARY   ADMITTING DIAGNOSIS:  Chest pain, rule out myocardial infarction.  FINAL DIAGNOSES: 1. Status post atypical chest pain, myocardial infarction ruled out. 2. Coronary artery disease, stable with patent stents in recent past. 3. Status post exacerbation bronchial asthma. 4. Hypertension. 5. Insulin-requiring diabetes mellitus. 6. Hypercholesteremia. 7. Chronic anemia. 8. History of tobacco abuse. 9. History of colonic polyps. 10.Anxiety disorder.  DISCHARGE HOME MEDICATIONS: 1. Enteric-coated aspirin 81 mg 1 tablet daily. 2. Carvedilol 6.25 mg half tablet twice daily. 3. Amlodipine 5 mg 1 tablet daily. 4. Atorvastatin 20 mg 1 tab daily. 5. Xanax 0.25 mg twice daily. 6. Norco 1-2 tablets every 6 hours as needed. 7. Proventil inhaler 2 puffs every 6 hours as needed. 8. Pulmicort by nebulizer twice daily as before. 9. Tessalon 100 mg 3 times daily as needed for cough. 10.Pepcid 20 mg 1 tablet twice daily. 11.Lantus insulin 40 units at bedtime as before. 12.Avapro 150 mg 1 tablet daily. 13.Nitrostat 0.4 mg sublingual use as directed. 14.Omeprazole 20 mg 1 tablet daily, 30 minutes before breakfast. 15.Zofran 4 mg as needed for nausea. 16.Tramadol 50 mg twice daily p.r.n. for pain.  The patient has been advised to stop clonidine.  DIET:  Low salt, low cholesterol, 1800 calories ADA diet.  The patient has been advised to monitor blood sugar daily.  CONDITION ON DISCHARGE:  Stable.  Follow up with me in 1 week.  BRIEF HISTORY:  [name] is a 61 year old black female with past medical history significant for coronary artery disease, status post PTCA  stenting to left circumflex in 2008 and PTCA stenting to RCA in 2009; hypertension; insulin-requiring diabetes mellitus; hypercholesteremia; GERD; history of tobacco abuse; COPD; history of bronchial asthma; history of colonic polyps; chronic anemia.  She was admitted by Dr. Doylene Canard on March 31 because of left-sided precordial chest pain and shortness of breath for 1 day duration, some chest pain was reproducible by palpation.  The patient also has a history of chronic nausea.  No fever.  Complains of occasional cough.  PAST MEDICAL HISTORY:  As above.  PAST SURGICAL HISTORY:  She had a right colectomy for colonic perforation in the past, had tubal ligation in the past, had PTCA stenting as above in the past.  PHYSICAL EXAMINATION:  GENERAL:  She was alert, awake, oriented x3. HEENT:  Conjunctivae was pink. NECK:  Supple.  No JVD.  No bruit. LUNGS:  Clear to auscultation without rhonchi or rales. CARDIOVASCULAR:  S1, S2 was normal.  There was 2/6 systolic murmur. ABDOMEN:  Soft.  Bowel sounds were present.  Nontender. EXTREMITIES:  There was no clubbing, cyanosis, or edema.  LABORATORY DATA:  Sodium 139, potassium 4.2, BUN 11, creatinine 0.96, and glucose was 181.  Her 3 sets of cardiac enzymes were negative.  D- dimer was negative.  Cholesterol was 91 triglycerides were  157, LDL was 30, HDL was 30.  Hemoglobin was 11.9, hematocrit 38.8, white count of 13.1.  Repeat hemoglobin was 10.5, hematocrit 33.8, white count of 10.5. Yesterday, hemoglobin was 9.7, hematocrit 32.1, white count of 8.6.  Chest x-ray showed no active disease.  Initial EKG showed sinus tachycardia, poor R-wave progression in anteroseptal leads.  Repeat EKG done yesterday showed normal sinus rhythm with a possible old inferior MI, poor R-wave progression.  BRIEF HOSPITAL COURSE:  The patient was admitted to Step-Down Unit.  MI was ruled out by serial enzymes and EKG.  The patient had similar presentation  approximately 2 months ago when subsequently she underwent a left cardiac catheterization and was noted to have patent stents.  The patient did not have any episodes of anginal chest pain during the hospital stay.  The patient had localized left-sided chest pain, which increases with movement.  The patient received Percocet with improvement in her pain.  D-dimers were obtained, which were also negative.  I discussed with the patient regarding skilled nursing facility versus home.  She states she rather go home.  The patient will be discharged home on above medications and will be followed up in my office in 1 week.     Allegra Lai. Terrence Dupont, M.D.    MNH/MEDQ  D:  05/09/2011  T:  05/09/2011  Job:  [id number]

## 2011-05-09 NOTE — Progress Notes (Signed)
Physical Therapy Treatment Patient Details Name: [name] MRN: [id number] DOB: [birth year] Today's Date: 05/09/2011  PT Assessment/Plan  PT - Assessment/Plan Comments on Treatment Session: Pt with slow, mildly unsteady gait that while shows a risk of falls, pt may be okay in her home environment.  Pt could benefit from SNF for rehab, but choses home.  Recommend HHPT. PT Plan: Discharge plan remains appropriate Follow Up Recommendations: Other (comment) (pt choses to go home rather than SNF with rehab) Equipment Recommended: Defer to next venue PT Goals  Acute Rehab PT Goals PT Goal: Sit to Stand - Progress: Progressing toward goal PT Goal: Stand to Sit - Progress: Progressing toward goal PT Goal: Ambulate - Progress: Progressing toward goal  PT Treatment Precautions/Restrictions  Precautions Precautions: Fall Required Braces or Orthoses: No Restrictions Weight Bearing Restrictions: No Mobility (including Balance) Bed Mobility Bed Mobility: No Transfers Sit to Stand: Other (comment) (min guard A) Sit to Stand Details (indicate cue type and reason): cues for safety, hand placement Stand to Sit: 5: Supervision Ambulation/Gait Ambulation/Gait: Yes Ambulation/Gait Assistance Details (indicate cue type and reason): short guarded steps; slow unvariable cadence. mildly unsteady at times  with no device except rail, and will benefit from using and Assitive device such as a cane.   HHPT follow up recommended. Ambulation Distance (Feet): 220 Feet Assistive device: 1 person hand held assist;Other (Comment) (rail) Gait Pattern: Step-through pattern;Decreased step length - right;Decreased step length - left;Decreased stride length (occ. shuffle) Gait velocity: very slow Stairs: No Wheelchair Mobility Wheelchair Mobility: No  Posture/Postural Control Posture/Postural Control: No significant limitations Balance Balance Assessed: No Exercise    End of Session PT - End of  Session Activity Tolerance: Patient tolerated treatment well Patient left: in chair;in CPM;with family/visitor present Nurse Communication: Mobility status for ambulation General Behavior During Session: [facility] for tasks performed Cognition: [facility] for tasks performed  Aarti Mankowski, Tessie Fass 05/09/2011, 2:49 PM  05/09/2011  Donnella Sham, PT [phone] (pager)

## 2011-05-14 ENCOUNTER — Encounter: Payer: Self-pay | Admitting: Pulmonary Disease

## 2011-05-14 ENCOUNTER — Ambulatory Visit (INDEPENDENT_AMBULATORY_CARE_PROVIDER_SITE_OTHER): Payer: Medicaid Other | Admitting: Pulmonary Disease

## 2011-05-14 VITALS — BP 146/80 | HR 114 | Temp 98.1°F | Ht 59.0 in | Wt 142.4 lb

## 2011-05-14 DIAGNOSIS — R059 Cough, unspecified: Secondary | ICD-10-CM | POA: Insufficient documentation

## 2011-05-14 DIAGNOSIS — R05 Cough: Secondary | ICD-10-CM | POA: Insufficient documentation

## 2011-05-14 DIAGNOSIS — J449 Chronic obstructive pulmonary disease, unspecified: Secondary | ICD-10-CM

## 2011-05-14 NOTE — Progress Notes (Signed)
  Subjective:    Patient ID: [name], female    DOB: 26-Oct-1950, 61 y.o.   MRN: [id number]  HPI The patient is a 61 year old female who been asked to see for COPD.  She has a long history of tobacco abuse, but has not smoked in a year.  She was also told that she had asthma as a child, but it only bothered her mildly during adult.  She reports that she has had breathing issues for 2 years, and feels they are getting worse.  Currently she is having shortness of breath just walking through her house, or trying to bring groceries in from the car.  She also does a lot of coughing with minimal mucus, and admits to both significant reflux symptoms and postnasal drip.  She has not had lower extremity edema.  She recently had a cardiac catheterization which showed patent stents.  She had a recent chest x-ray that showed no acute disease, but has never had pulmonary function studies.  Currently she is only on nebulized budesonide and as needed albuterol metered-dose inhaler.   Review of Systems  Constitutional: Positive for unexpected weight change. Negative for fever.  HENT: Positive for trouble swallowing and dental problem. Negative for ear pain, nosebleeds, congestion, sore throat, rhinorrhea, sneezing, postnasal drip and sinus pressure.   Eyes: Negative for redness and itching.  Respiratory: Positive for cough and shortness of breath. Negative for chest tightness and wheezing.   Cardiovascular: Positive for chest pain and palpitations. Negative for leg swelling.  Gastrointestinal: Positive for abdominal pain. Negative for nausea and vomiting.  Genitourinary: Negative for dysuria.  Musculoskeletal: Positive for joint swelling.  Skin: Negative for rash.  Neurological: Positive for headaches.  Hematological: Does not bruise/bleed easily.  Psychiatric/Behavioral: Negative for dysphoric mood. The patient is nervous/anxious.        Objective:   Physical Exam Constitutional:  Overweight female,  no acute distress  HENT:  Nares patent without discharge  Oropharynx without exudate, palate and uvula are normal  Eyes:  Perrla, eomi, no scleral icterus  Neck:  No JVD, no TMG  Cardiovascular:  Normal rate, regular rhythm, no rubs or gallops.  No murmurs        Intact distal pulses but decreased.  Pulmonary :  Mildly decreased bs, no stridor or respiratory distress   No rales, rhonchi, or wheezing.  +upper airway pseudowheezing.   Abdominal:  Soft, nondistended, bowel sounds present.  No tenderness noted.   Musculoskeletal:  No lower extremity edema noted.  Lymph Nodes:  No cervical lymphadenopathy noted  Skin:  No cyanosis noted  Neurologic:  Alert, appropriate, moves all 4 extremities without obvious deficit.         Assessment & Plan:

## 2011-05-14 NOTE — Assessment & Plan Note (Signed)
The patient has a chronic cough that sounds more upper airway in origin than lower.  She also has very significant upper airway pseudo-wheezing as well.  She is describing both reflux symptoms as well as postnasal drip, both of which are the most common causes of upper airway cough.  She is not on an ACE inhibitor currently according to her medication list.  I would like to intensify treatment for reflux, and start her on chlorpheniramine for postnasal drip to see if the cough improves.

## 2011-05-14 NOTE — Assessment & Plan Note (Signed)
The patient has been given a presumed diagnosis of COPD, but she has never had pulmonary function studies.  It is unclear whether she has emphysema, chronic obstructive asthma, or no airflow obstruction at all.  At this point, it is very important that we do pulmonary function studies to establish a firm diagnosis.  I will presume for nail that she may have chronic obstructive asthma, and would like to start her on a LABA/ICS.  We'll see her back after her breathing studies for further treatment suggestions.

## 2011-05-14 NOTE — Patient Instructions (Addendum)
Stop budesonide in your neb machine.  Instead take symbicort 160/4.5  2 inhalations each am and pm everyday whether you think you need or not.  Will get you a spacer to help with this. Can use albuterol as needed only for rescue. Stop pepcid, and take omeprazole for acid reflux one in am AND pm. Get chlorpheniramine over the counter, 8mg , and take everynight at bedtime for postnasal drip Will schedule for breathing studies in the next 2-3 weeks, and see you back same day to review with you.

## 2011-05-15 ENCOUNTER — Other Ambulatory Visit: Payer: Self-pay | Admitting: Cardiology

## 2011-05-29 ENCOUNTER — Other Ambulatory Visit: Payer: Self-pay | Admitting: Cardiology

## 2011-06-04 ENCOUNTER — Ambulatory Visit (INDEPENDENT_AMBULATORY_CARE_PROVIDER_SITE_OTHER): Payer: Medicaid Other | Admitting: Pulmonary Disease

## 2011-06-04 ENCOUNTER — Encounter: Payer: Self-pay | Admitting: Pulmonary Disease

## 2011-06-04 VITALS — BP 128/78 | HR 88 | Temp 97.9°F | Ht 60.0 in | Wt 142.0 lb

## 2011-06-04 DIAGNOSIS — J449 Chronic obstructive pulmonary disease, unspecified: Secondary | ICD-10-CM

## 2011-06-04 DIAGNOSIS — R05 Cough: Secondary | ICD-10-CM

## 2011-06-04 DIAGNOSIS — R0989 Other specified symptoms and signs involving the circulatory and respiratory systems: Secondary | ICD-10-CM

## 2011-06-04 LAB — PULMONARY FUNCTION TEST

## 2011-06-04 MED ORDER — OMEPRAZOLE 40 MG PO CPDR: 40.0000 mg | DELAYED_RELEASE_CAPSULE | Freq: Every day | ORAL | Status: DC

## 2011-06-04 NOTE — Progress Notes (Signed)
  Subjective:    Patient ID: [name], female    DOB: [birth year], 61 y.o.   MRN: [id number]  HPI The patient comes in today for followup of her dyspnea on exertion and chronic cough.  At the last visit, it was unclear whether she had COPD or not, and she had pulmonary function studies for documentation today.  Her pre-bronchodilator spirometry was uninterpretable secondary to poor technique, however her postbronchodilator spirometry was totally normal.  She was started on symbicort at the last visit, his really see no difference in her breathing.  She is also had a chronic cough that I felt was upper airway in origin, and I prescribed treatment with a stronger antihistamine and also increased her omeprazole.  Unfortunately, she ran out of her omeprazole, and failed to call us to let us know.  It is really unclear if she is even taking her chlorpheniramine.  She continues to have a dry upper airway hacking cough.   Review of Systems  Constitutional: Negative for fever and unexpected weight change.  HENT: Negative for ear pain, nosebleeds, congestion, sore throat, rhinorrhea, sneezing, trouble swallowing, dental problem, postnasal drip and sinus pressure.   Eyes: Negative for redness and itching.  Respiratory: Positive for cough and shortness of breath. Negative for chest tightness and wheezing.   Cardiovascular: Negative for palpitations and leg swelling.  Gastrointestinal: Negative for nausea and vomiting.  Genitourinary: Negative for dysuria.  Musculoskeletal: Negative for joint swelling.  Skin: Negative for rash.  Neurological: Positive for headaches.  Hematological: Does not bruise/bleed easily.  Psychiatric/Behavioral: Negative for dysphoric mood. The patient is not nervous/anxious.        Objective:   Physical Exam Obese female in no acute distress Nose without purulence or discharge noted Chest with mild decrease in breath sounds, no wheezing Cardiac exam is regular rate and  rhythm Lower extremities with mild edema, no cyanosis Alert and oriented, moves all 4 extremities.       Assessment & Plan:

## 2011-06-04 NOTE — Patient Instructions (Signed)
Stop ALL inhalers except albuterol if you get into trouble with your breathing. Increase chlorpheniramine to 8mg  (2 of the 4mg  tabs) at bedtime, and 4mg  at lunch Will increase omeprazole to 40mg  in am and pm (for more aggressive treatment of acid reflux). followup with me in 3 weeks.

## 2011-06-04 NOTE — Assessment & Plan Note (Signed)
The patient had very poor technique on prebronchodilator spirometry, however her postbronchodilator curves and values appear totally normal.  Therefore she cannot have COPD.  This does not exclude the possibility of asthma.  I would like to discontinue all of her asthma and medications except for albuterol with emergencies, and have her return in 3 weeks and repeat her simple spirometry.  If this is normal, I see no reason for her to be on any type of bronchodilator.  Her dyspnea on exertion may simply be due to her obesity and deconditioning.  We also have not excluded a possible cardiac component.

## 2011-06-04 NOTE — Assessment & Plan Note (Signed)
The patient continues to have a dry and hacking upper airway cough, and unfortunately she was not compliant with her proton pump inhibitor.  I am not sure that she has been compliant with her antihistamine.  I think it is also possible that her inhalers may actually be aggravating her upper airway and making her cough worse.  I have reiterated again the importance of staying on her proton pump inhibitor, and would like to escalate the dose for a period of time.  I would also like her to take her antihistamine twice a day instead of once.  Will followup her symptom of cough at the next visit.

## 2011-06-04 NOTE — Progress Notes (Signed)
PFT done today. 

## 2011-06-14 ENCOUNTER — Other Ambulatory Visit: Payer: Self-pay | Admitting: Cardiology

## 2011-06-14 ENCOUNTER — Other Ambulatory Visit (HOSPITAL_COMMUNITY): Payer: Self-pay | Admitting: Internal Medicine

## 2011-06-24 IMAGING — CR DG CHEST 2V
2 series · 2 of 2 positions shown · non-contrast
Comparison: 08/26/2007 and 02/25/2005 studies

CLINICAL DATA: History given of previous tobacco smoking, shortness
of breath, and coughing

CHEST - 2 VIEW

[w chest pa]
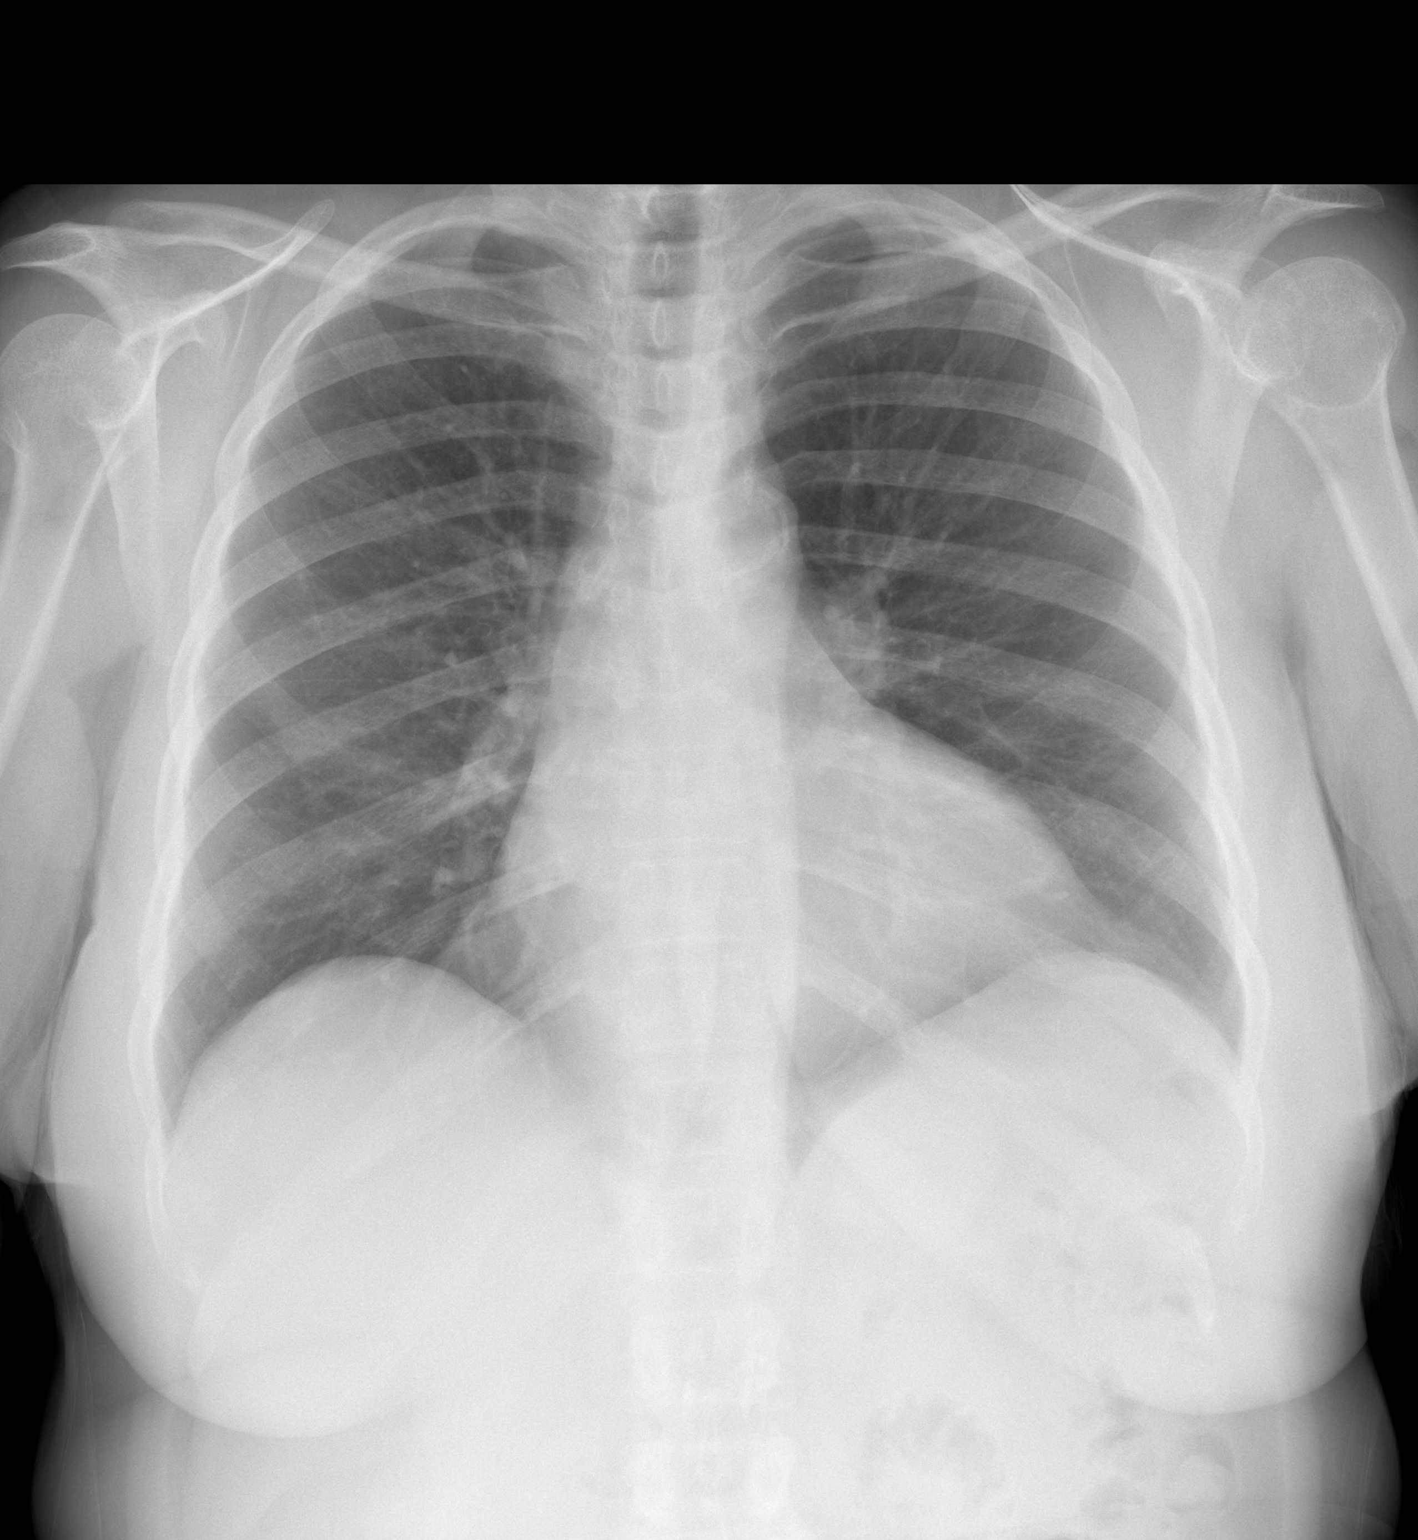

[w chest lat]
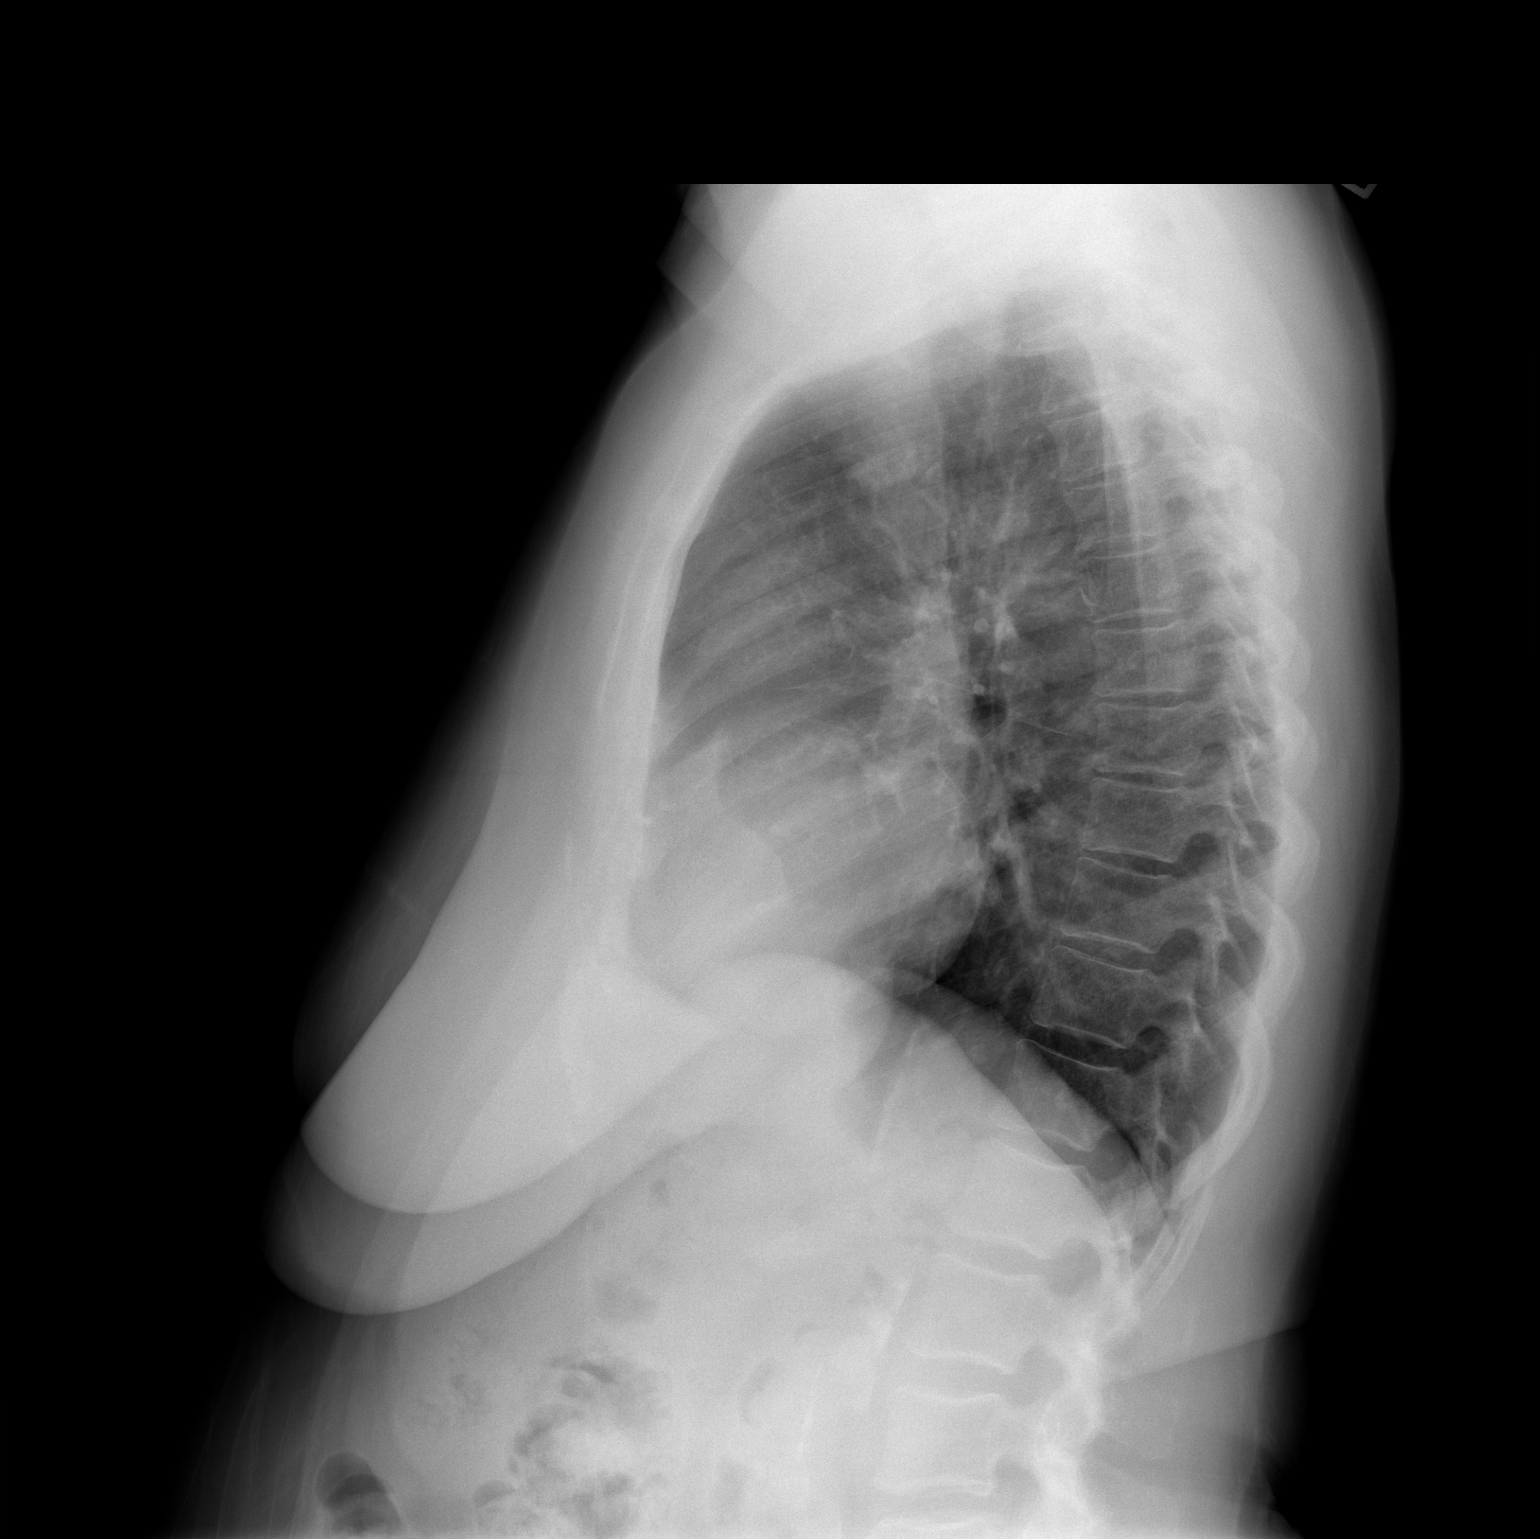

[2 of 2 positions shown; findings below may reference images not displayed]

FINDINGS: The cardiac silhouette is minimally enlarged.
The lungs are well aerated and free of infiltrates. Ectasia and
nonaneurysmal calcification of the thoracic aorta is seen. No
pleural abnormality is evident.
There is a mildly osteopenic appearance of the bones. There is mild
degenerative spondylosis compatible with age.
IMPRESSION: The cardiac silhouette is minimally enlarged.
No acute process is identified.

## 2011-06-25 ENCOUNTER — Encounter: Payer: Self-pay | Admitting: Pulmonary Disease

## 2011-06-25 ENCOUNTER — Ambulatory Visit (INDEPENDENT_AMBULATORY_CARE_PROVIDER_SITE_OTHER): Payer: Medicaid Other | Admitting: Pulmonary Disease

## 2011-06-25 VITALS — BP 138/70 | HR 75 | Temp 98.4°F | Ht 59.0 in | Wt 144.8 lb

## 2011-06-25 DIAGNOSIS — R0609 Other forms of dyspnea: Secondary | ICD-10-CM

## 2011-06-25 DIAGNOSIS — R05 Cough: Secondary | ICD-10-CM

## 2011-06-25 NOTE — Progress Notes (Signed)
  Subjective:    Patient ID: [name], female    DOB: March 15, 1950, 61 y.o.   MRN: [id number]  HPI The patient comes in today for followup of her chronic cough.  At the last visit, I asked her to take an antihistamine and also proton pump inhibitor twice a day, and also reviewed behavioral therapy for cyclical coughing.  I also asked her to discontinue her inhalers except for albuterol as needed, since her postbronchodilator spirometry showed no airflow obstruction.  She was to return today to have spirometry off the inhaled steroids to see if she had underlying asthma or not.  Unfortunately, the patient never stopped her pulmonary medication, because she felt that she would have a hard time breathing at night without it.  I have reminded her that she could still use albuterol as needed.  Also question whether she has been compliant with the antihistamine or proton pump inhibitor.  The patient states that her cough is 25% improved, but continues to have throat clearing during our visit.   Review of Systems  Constitutional: Negative.  Negative for fever and unexpected weight change.  HENT: Negative for ear pain, nosebleeds, congestion, sore throat, rhinorrhea, sneezing, trouble swallowing, dental problem, postnasal drip and sinus pressure.   Eyes: Positive for itching. Negative for redness.  Respiratory: Positive for cough. Negative for chest tightness, shortness of breath and wheezing.   Cardiovascular: Positive for leg swelling. Negative for palpitations.  Gastrointestinal: Negative.  Negative for nausea and vomiting.  Genitourinary: Negative.  Negative for dysuria.  Musculoskeletal: Negative.  Negative for joint swelling.  Skin: Negative.  Negative for rash.  Neurological: Positive for headaches.  Hematological: Negative.  Does not bruise/bleed easily.  Psychiatric/Behavioral: Negative.  Negative for dysphoric mood. The patient is not nervous/anxious.        Objective:   Physical  Exam Obese female in no acute distress Nose without purulence or discharge noted Chest totally clear with no wheezes Cardiac Exam with regular rate and rhythm Lower extremities with no significant edema, no cyanosis Alert and oriented, moves all 4 extremities.        Assessment & Plan:

## 2011-06-25 NOTE — Assessment & Plan Note (Signed)
The patient's cough sounds more upper airway in origin than lower.  It is 25% better from the last visit, but I question whether she has been compliant with her medications for upper airway sources of cough.  She still has some throat clearing in the office today, and I have asked her to work on this.  She is to continue her antihistamine and proton pump inhibitor, as well as her behavioral therapies.

## 2011-06-25 NOTE — Patient Instructions (Addendum)
Stop all breathing medication except albuterol as needed for shortness of breath Stay on chlorpheniramine 8mg  at bedtime and 4mg  at lunch, as well as your acid reflux medication (omeprazole) 40mg  in am and pm.  Limit voice use, and no throat clearing.  Use hard candy (no menthol or mint) during day to help with this. followup with me in 4 weeks so we can check on progress of cough.

## 2011-06-25 NOTE — Assessment & Plan Note (Signed)
The patient was supposed to discontinue her long-acting bronchodilators and inhaled steroids, but failed to do so.  Again, she had normal post bronchodilator spirometry, and therefore does not have COPD.  However, cannot exclude asthma.  I have asked her again to totally discontinue her breathing medications except for albuterol as needed, and to followup with me in 4 weeks where we can do spirometry to see if it has changed off inhaled corticosteroids (to r/o asthma).

## 2011-06-27 IMAGING — CR DG CHEST 1V PORT
1 series · 1 of 1 positions shown · non-contrast
Comparison: None

CLINICAL DATA: Left-sided chest pain and S O B

PORTABLE CHEST - 1 VIEW

[view not recorded]
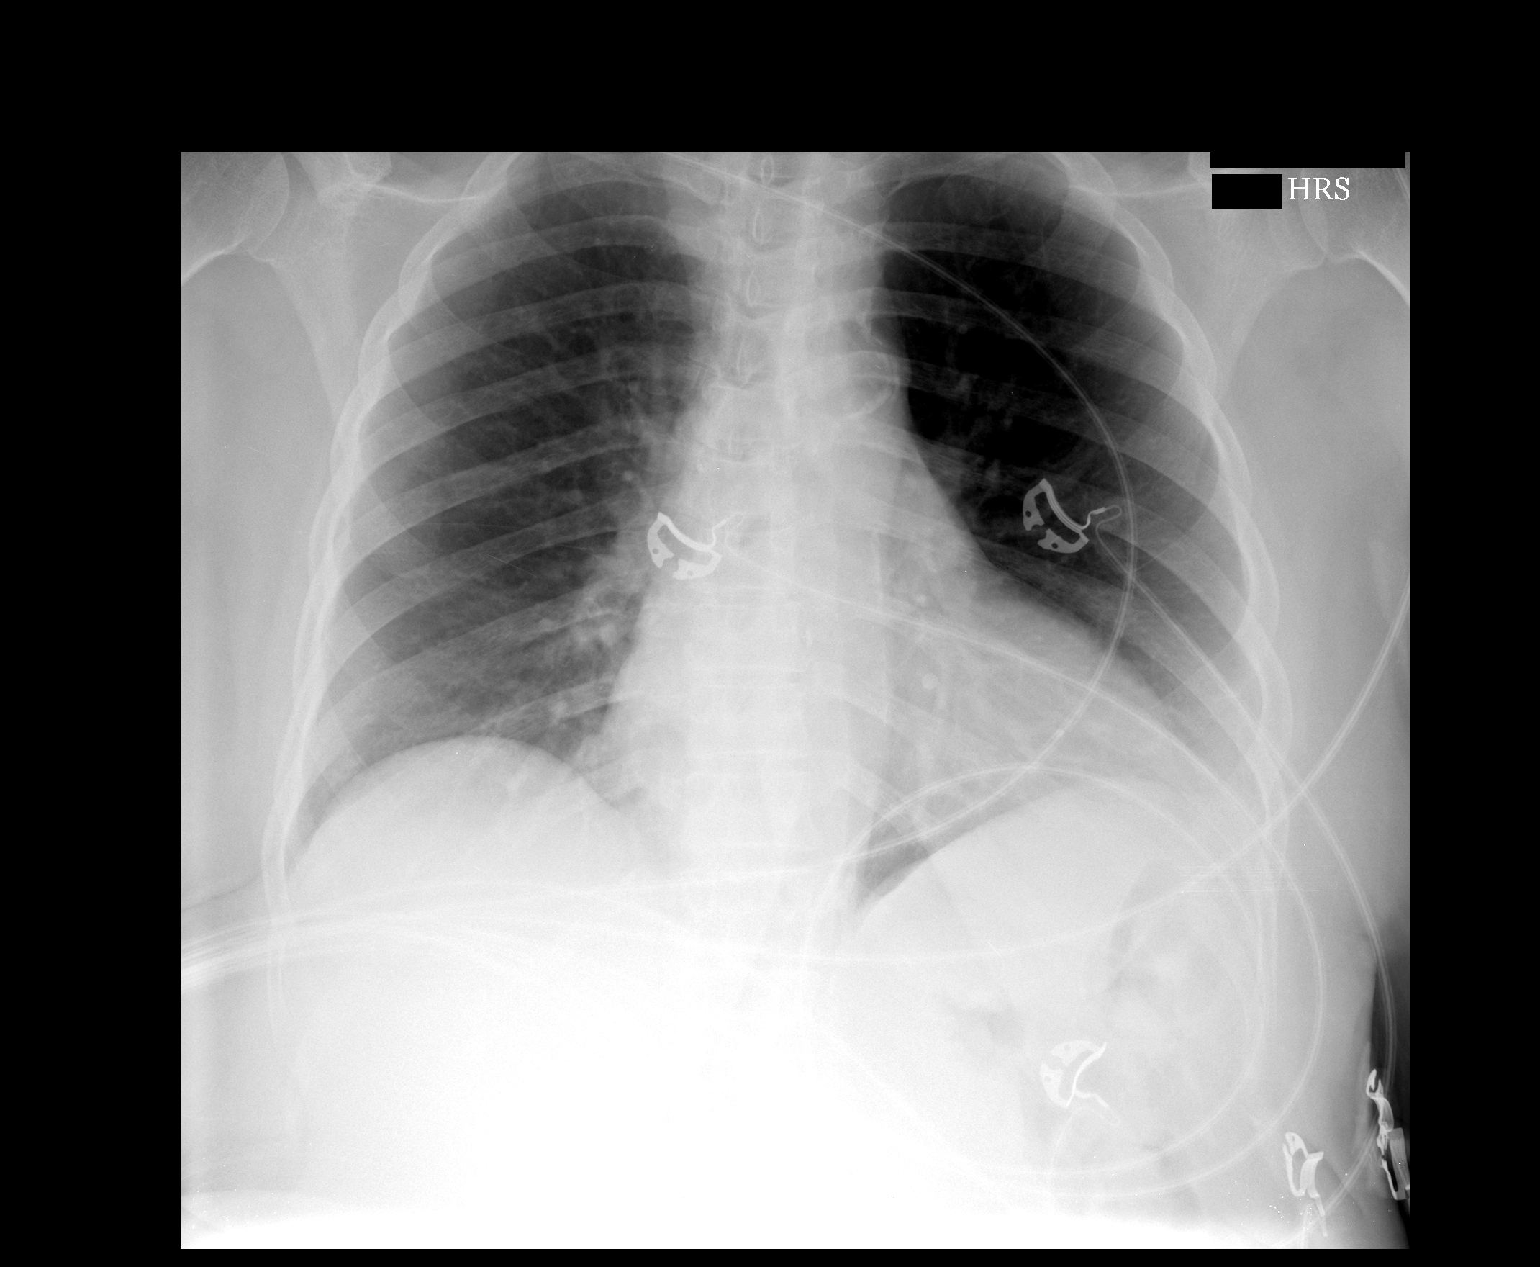

[1 of 1 positions shown; findings below may reference images not displayed]

FINDINGS: The heart size and mediastinal contours are within normal
limits.  Both lungs are clear.  The visualized skeletal structures
are unremarkable.
IMPRESSION: No acute findings.

## 2011-07-18 ENCOUNTER — Other Ambulatory Visit: Payer: Self-pay | Admitting: Cardiology

## 2011-07-23 ENCOUNTER — Encounter: Payer: Self-pay | Admitting: Pulmonary Disease

## 2011-07-23 ENCOUNTER — Ambulatory Visit (INDEPENDENT_AMBULATORY_CARE_PROVIDER_SITE_OTHER): Payer: Medicaid Other | Admitting: Pulmonary Disease

## 2011-07-23 VITALS — BP 116/78 | HR 79 | Temp 97.8°F | Ht 59.0 in | Wt 144.8 lb

## 2011-07-23 DIAGNOSIS — R0989 Other specified symptoms and signs involving the circulatory and respiratory systems: Secondary | ICD-10-CM

## 2011-07-23 DIAGNOSIS — R05 Cough: Secondary | ICD-10-CM

## 2011-07-23 NOTE — Progress Notes (Signed)
  Subjective:    Patient ID: [name], female    DOB: 04/27/1950, 61 y.o.   MRN: [id number]  HPI The patient comes in today for followup of her chronic cough, and also dyspnea on exertion.  She has had normal spirometry at the last visit, and therefore does not have COPD.  I felt her cough was more upper airway in origin and lower, and she is being treated for postnasal drip and reflux disease.  I have asked her to discontinue all of her pulmonary medications except for as needed albuterol, and the goal was to do followup spirometry today to see if still normal.  Unfortunately, the patient continues to use Pulmicort nebulizers.  She is also using albuterol as needed.  She feels that her cough is somewhat better, but continues to have dyspnea on exertion.   Review of Systems  Constitutional: Negative.  Negative for fever and unexpected weight change.  HENT: Negative.  Negative for ear pain, nosebleeds, congestion, sore throat, rhinorrhea, sneezing, trouble swallowing, dental problem, postnasal drip and sinus pressure.   Eyes: Negative.  Negative for redness and itching.  Respiratory: Positive for cough and shortness of breath. Negative for chest tightness and wheezing.   Cardiovascular: Negative.  Negative for palpitations and leg swelling.  Gastrointestinal: Negative.  Negative for nausea and vomiting.  Genitourinary: Negative.  Negative for dysuria.  Musculoskeletal: Negative.  Negative for joint swelling.  Skin: Negative.  Negative for rash.  Neurological: Negative.  Negative for headaches.  Hematological: Negative.  Does not bruise/bleed easily.  Psychiatric/Behavioral: Negative.  Negative for dysphoric mood. The patient is not nervous/anxious.        Objective:   Physical Exam Obese female in no acute distress Nose without purulence or discharge noted OP clear, no exudates No tmg or LN Chest with clear breath sounds, but decreased depth of inspiration, no wheezing Cardiac exam  is regular rate and rhythm Lower extremities with mild edema, no cyanosis Alert and oriented, moves all 4 extremities.       Assessment & Plan:

## 2011-07-23 NOTE — Assessment & Plan Note (Signed)
I suspect the patient shortness of breath has nothing to do with a pulmonary issue.  She has had normal spirometry in the past, as well as a clear chest x-ray.  Her lungs continue to be clear as well.  We'll wait and see what her methacholine challenge shows.

## 2011-07-23 NOTE — Assessment & Plan Note (Signed)
The patient feels that her cough is somewhat better with treatment of postnasal drip and reflux, but she continues to cough during her visit.  I suspect that she has not followed my directions compliantly, and is not working on the behavioral therapies as I suggested.  I have been wanting to get her completely off inhaled steroids in order to document spirometry off medication to see if she may have asthma.  She has stayed on her nebulized inhaled steroid against my recommendation.  In order to put this issue to rest, I would like to schedule her for a methacholine challenge to rule out cough variant asthma.

## 2011-07-23 NOTE — Patient Instructions (Addendum)
Stay off ALL breathing medications, including nebulizer treatments, for the next 2 weeks leading up to the breathing test.  Can use albuterol as needed for severe shortness of breath, but do not use the day of the test. Continue on your medication for acid reflux, and your antihistamine for postnasal drip. I will call you with results of testing.

## 2011-08-06 ENCOUNTER — Ambulatory Visit (INDEPENDENT_AMBULATORY_CARE_PROVIDER_SITE_OTHER): Payer: Medicaid Other | Admitting: Pulmonary Disease

## 2011-08-06 DIAGNOSIS — R0602 Shortness of breath: Secondary | ICD-10-CM

## 2011-08-06 DIAGNOSIS — R05 Cough: Secondary | ICD-10-CM

## 2011-08-06 LAB — PULMONARY FUNCTION TEST

## 2011-08-06 NOTE — Progress Notes (Signed)
PFT done today. 

## 2011-08-12 ENCOUNTER — Telehealth: Payer: Self-pay | Admitting: Pulmonary Disease

## 2011-08-12 NOTE — Telephone Encounter (Signed)
[name], this pt was to have methacholine challenge test not pfts.  Both of [place] dropped the ball on this one.  Need to let jules know so that pt will not be charged for the pfts (take out charge), and needs to be scheduled for methacholine challenge.  Let pt know and that she will not be charged for breathing studies.

## 2011-08-13 ENCOUNTER — Other Ambulatory Visit: Payer: Self-pay | Admitting: Cardiology

## 2011-08-13 ENCOUNTER — Encounter: Payer: Self-pay | Admitting: Pulmonary Disease

## 2011-08-13 NOTE — Telephone Encounter (Signed)
[name] has spoke with [facility] and will make sure the pt is not charged for the PFT. Test results given to [facility] for interpretation.

## 2011-08-17 ENCOUNTER — Encounter: Payer: Self-pay | Admitting: Pulmonary Disease

## 2011-08-27 ENCOUNTER — Encounter: Payer: Self-pay | Admitting: Internal Medicine

## 2011-09-14 ENCOUNTER — Other Ambulatory Visit: Payer: Self-pay | Admitting: Cardiology

## 2011-09-21 ENCOUNTER — Encounter: Payer: Self-pay | Admitting: *Deleted

## 2011-10-05 ENCOUNTER — Telehealth: Payer: Self-pay | Admitting: *Deleted

## 2011-10-05 DIAGNOSIS — D649 Anemia, unspecified: Secondary | ICD-10-CM

## 2011-10-05 NOTE — Telephone Encounter (Signed)
I have spoken to patient to advise that she will need labwork prior to her appointment with [place] on Tuesday since we never received the labwork from [facility] and have nothing to go by. Patient will come prior to her appointment for labs.

## 2011-10-05 NOTE — Telephone Encounter (Signed)
Message copied by Larina Bras on Fri Oct 05, 2011  8:34 AM ------      Message from: Lafayette Dragon      Created: Fri Oct 05, 2011  1:41 AM       Can we, please, send the pt to the lab before she sees me to get STAT CBC, and also get B12, iron studies Thanx DB      ----- Message -----         From: Larina Bras, CMA         Sent: 10/05/2011           To: Lafayette Dragon, MD            FYI-      Patient has an appt with [place] on 10/09/11 for iron deficiency anemia (referred by [facility]). Unfortunately, [facility] never sent the labwork to [place] and they are no longer open. Therefore, I will be unable to obtain labwork showing anemia. We will have to repeat bloodwork when she comes to the office. So sorry :(

## 2011-10-09 ENCOUNTER — Encounter: Payer: Self-pay | Admitting: Internal Medicine

## 2011-10-09 ENCOUNTER — Other Ambulatory Visit (INDEPENDENT_AMBULATORY_CARE_PROVIDER_SITE_OTHER): Payer: Medicaid Other

## 2011-10-09 ENCOUNTER — Ambulatory Visit (INDEPENDENT_AMBULATORY_CARE_PROVIDER_SITE_OTHER): Payer: Medicaid Other | Admitting: Internal Medicine

## 2011-10-09 VITALS — BP 140/62 | HR 68 | Ht 59.0 in | Wt 138.6 lb

## 2011-10-09 DIAGNOSIS — Z86010 Personal history of colonic polyps: Secondary | ICD-10-CM

## 2011-10-09 DIAGNOSIS — R195 Other fecal abnormalities: Secondary | ICD-10-CM

## 2011-10-09 DIAGNOSIS — D649 Anemia, unspecified: Secondary | ICD-10-CM

## 2011-10-09 LAB — CBC WITH DIFFERENTIAL/PLATELET
Basophils Absolute: 0 10*3/uL (ref 0.0–0.1)
HCT: 36.8 % (ref 36.0–46.0)
Hemoglobin: 11.3 g/dL — ABNORMAL LOW (ref 12.0–15.0)
Lymphs Abs: 2.2 10*3/uL (ref 0.7–4.0)
MCHC: 30.6 g/dL (ref 30.0–36.0)
MCV: 79.8 fl (ref 78.0–100.0)
Monocytes Relative: 10.2 % (ref 3.0–12.0)
Neutro Abs: 4.9 10*3/uL (ref 1.4–7.7)
RDW: 20.3 % — ABNORMAL HIGH (ref 11.5–14.6)

## 2011-10-09 LAB — IBC PANEL: Saturation Ratios: 6.1 % — ABNORMAL LOW (ref 20.0–50.0)

## 2011-10-09 MED ORDER — MOVIPREP 100 G PO SOLR: ORAL | Status: DC

## 2011-10-09 MED ORDER — CYANOCOBALAMIN 1000 MCG/ML IJ SOLN
1000.0000 ug | INTRAMUSCULAR | Status: DC
  Administered 2011-10-09 – 2012-01-15 (×3): 1000 ug via INTRAMUSCULAR

## 2011-10-09 NOTE — Progress Notes (Signed)
[name] 1950/08/05 MRN [id number]   History of Present Illness:  This is a 61 year old African American female with iron deficiency anemia and Hemoccult-positive stool. She has a history of multiple colon polyps and is status post right hemicolectomy following a post polypectomy perforation in 2009. Her last colonoscopy in March 2011 showed several  hyperplastic polyps. She has had intermittent lower abdominal pain. She denies constipation. She also has epigastric pain exacerbated by eating. She has been on Pepcid. Her last upper endoscopy in January 2009 was negative. A CT scan of the abdomen in February 2013 showed a 5 mm gallstone, fatty liver, degenerative joint disease of the lumbosacral spine and bilateral inguinal hernia. A barium esophagram in February 2013 showed dysmotility but the exam was limited due to patient's immobility. Her iron saturation is 6% with a B12 of 197. She was a patient at [facility] which closed last month and will be going to [facility] in the future. Patient complains of occasional bright red blood per rectum. Her hemoglobin was 13.9 in March of this year. 10.5 later  and 9.7 on 05/07/2011. Her HIDA scan in 2012 was normal with a 53% ejection fraction.   Past Medical History  Diagnosis Date  . History of colonic polyps   . GERD (gastroesophageal reflux disease)   . Hyperlipidemia   . Diabetes mellitus without mention of complication   . Hypertension   . Myocardial infarction   . Coronary artery disease   . Fatty liver disease, nonalcoholic   . Cholelithiasis   . Iron deficiency anemia   . Angina   . Shortness of breath   . Asthma    Past Surgical History  Procedure Date  . Right colectomy     After colonic perforation  . Tubal ligation   . Coronary angioplasty with stent placement     reports that she quit smoking about 17 months ago. Her smoking use included Cigarettes. She has a 23.5 pack-year smoking history. She quit smokeless tobacco use  about 18 months ago. She reports that she does not drink alcohol or use illicit drugs. family history includes Cancer in her father; Diabetes in her mother and sister; and Heart disease in her mother.  There is no history of Colon cancer. No Known Allergies      Review of Systems: Negative for heartburn dysphagia, positive for low abdominal pain and occasional hematochezia  The remainder of the 10 point ROS is negative except as outlined in H&P   Physical Exam: General appearance  Well developed, in no distress. Eyes- non icteric. HEENT nontraumatic, normocephalic. Mouth no lesions, tongue papillated, no cheilosis. Neck supple without adenopathy, thyroid not enlarged, no carotid bruits, no JVD. Lungs Clear to auscultation bilaterally. Audible wheezing Cor normal S1, normal S2, regular rhythm, no murmur,  quiet precordium. Abdomen: Large protuberant abdomen diffusely tender. Well-healed midline scar. Normal active bowel sounds. Liver edge at costal margin. No ascites. No tympany. Rectal: Moderate amount of brown Hemoccult-positive stool. Extremities no pedal edema. Skin no lesions. Neurological alert and oriented x 3. Psychological normal mood and affect.  Assessment and Plan:  Problem #1 61 year old African American female with Hemoccult-positive stool and drop in her hemoglobin from 13.9 to 9.7 with current  improvement to 11.3 today. She has had numerous endoscopies and colonoscopies for colon polyps with one complication of perforation in 2009 resulting in a right hemicolectomy. Because of her sudden drop in H&H, we will proceed with an upper endoscopy and colonoscopy to look  for source of bleeding and if nondiagnostic, she will probably require a small bowel capsule endoscopy to rule out AVMs.  Problem #2 B12 deficiency. She has a B12 today of 197. She will receive 1,000 mcg IM of B12 and will then have monthly injections thereafter.for 6 months, then recheck.  Problem #3 History  of colon polyps; mostly hyperplastic. We will schedule a recall colonoscopy because of her Hemoccult-positive stool and significant drop in H&H.   10/09/2011 Delfin Edis

## 2011-10-09 NOTE — Patient Instructions (Addendum)
You have been scheduled for an endoscopy and colonoscopy. Please follow the written instructions given to you at your visit today. Please pick up your prep at the pharmacy within the next 1-3 days. If you use inhalers (even only as needed), please bring them with you on the day of your procedure. We have given you a b12 injection today. Please schedule your next injection with [place] for 1 month from now. CC: [facility]

## 2011-10-25 IMAGING — CR DG CHEST 2V
2 series · 2 of 2 positions shown · non-contrast
Comparison: Plain film chest 10/16/2008 and 08/26/2007.

CLINICAL DATA: Cough and chest pain.

CHEST - 2 VIEW

[w chest pa]
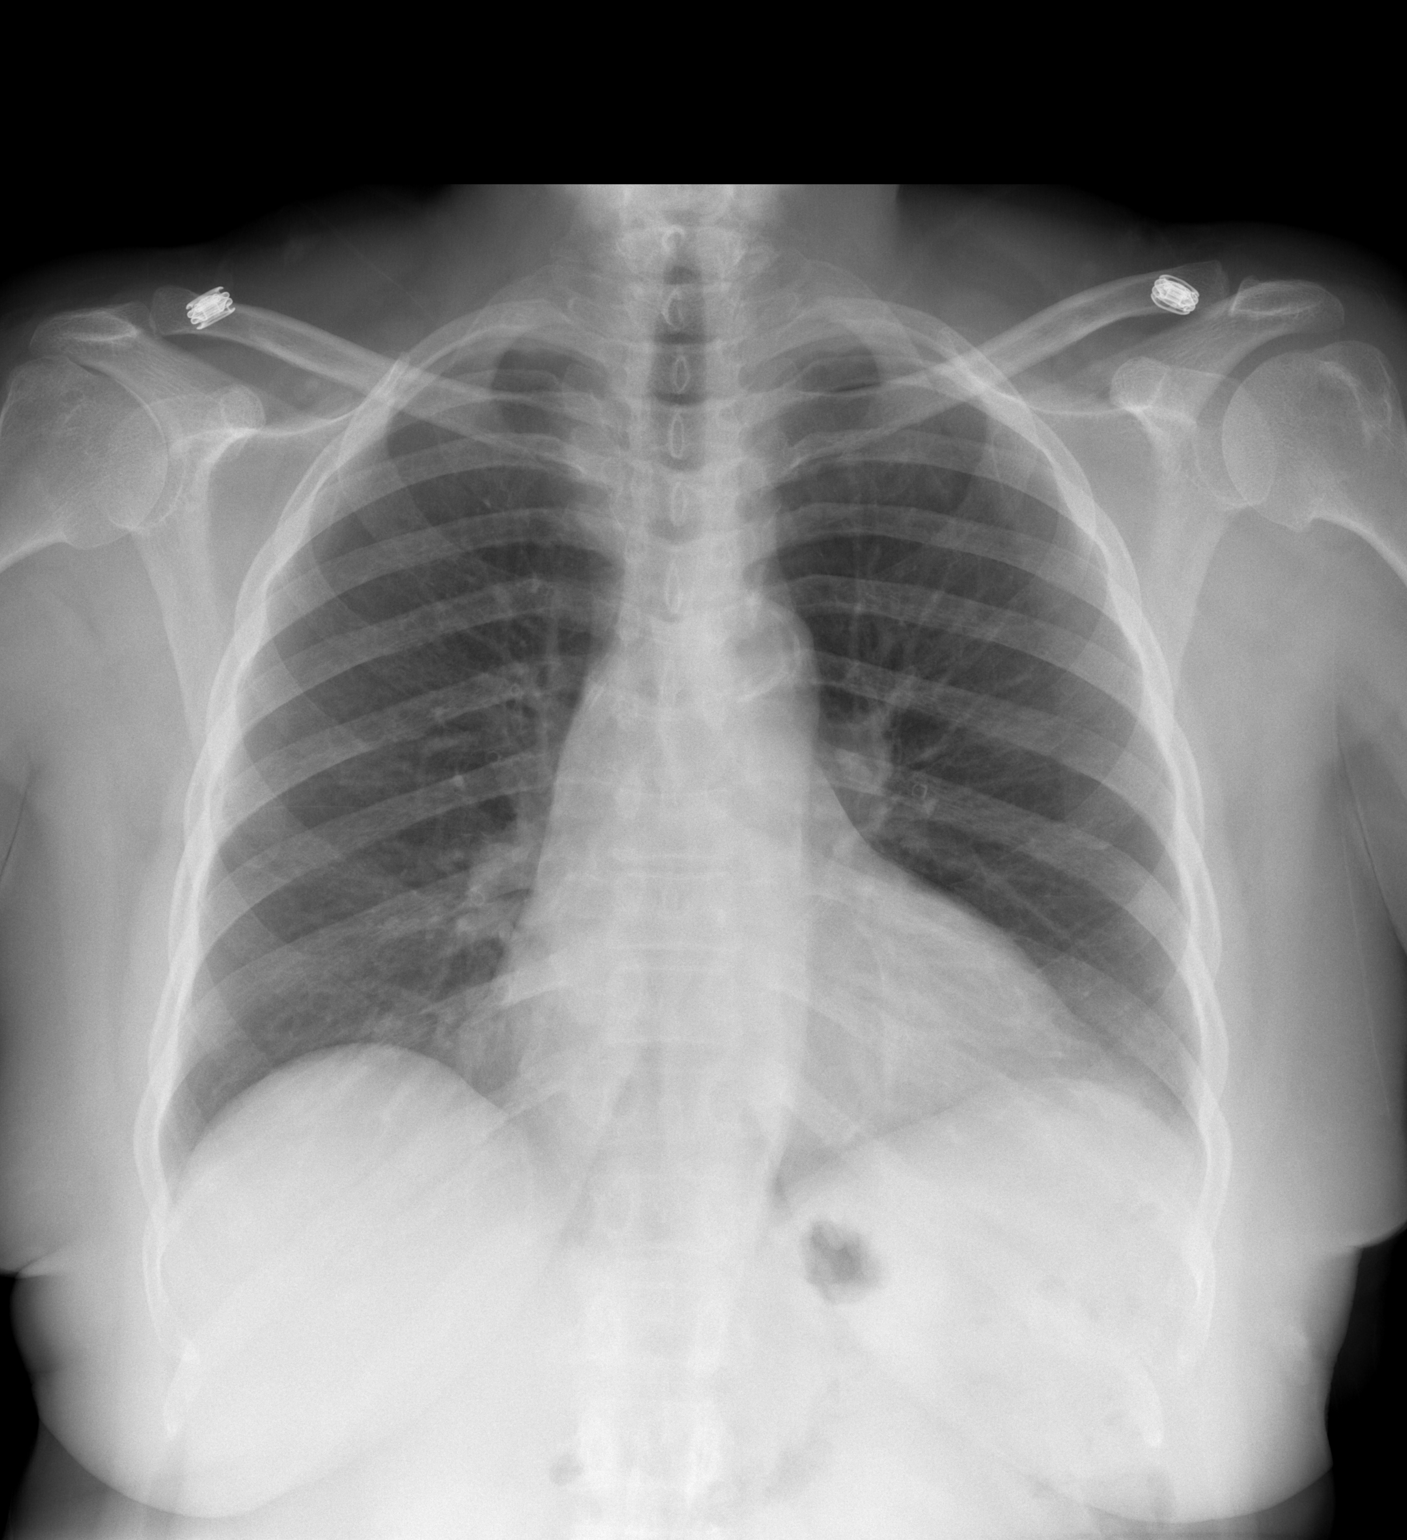

[w chest lat]
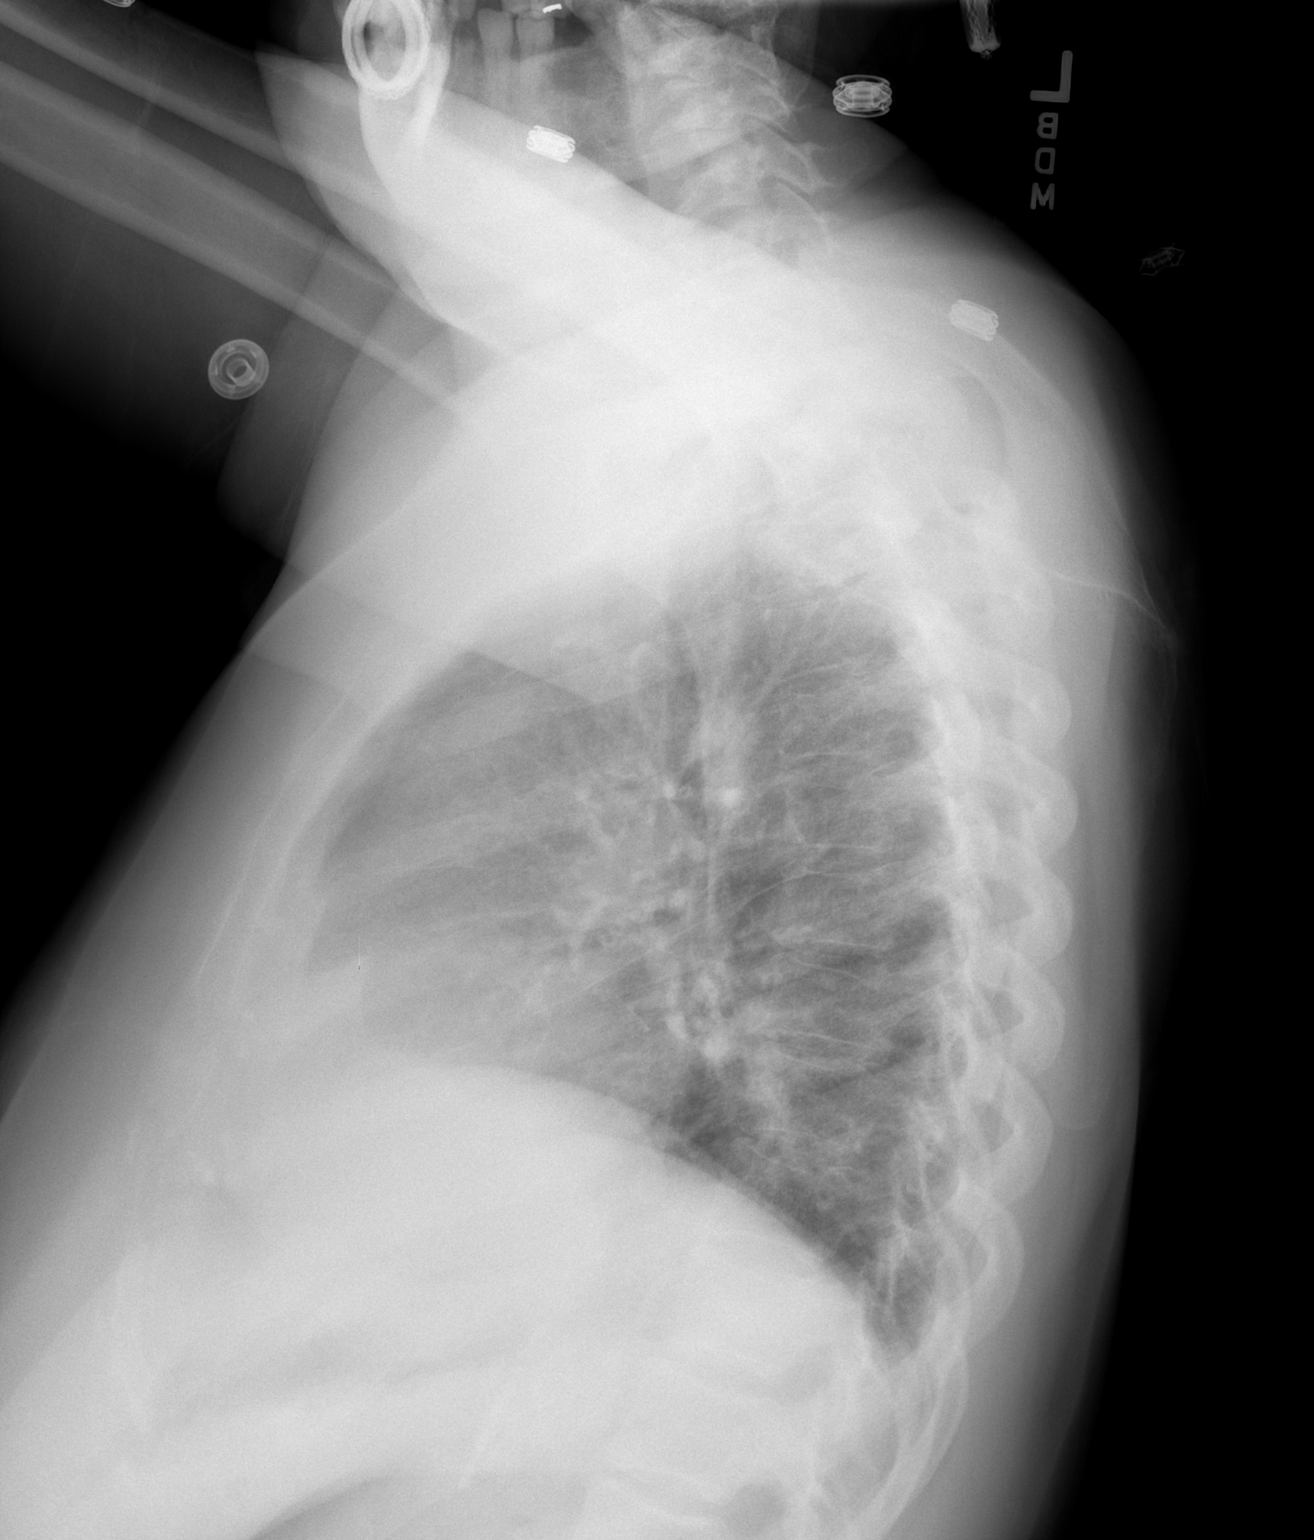

[2 of 2 positions shown; findings below may reference images not displayed]

FINDINGS: The lungs are clear.  Heart size is normal.  There is no
pleural effusion or focal bony abnormality.
IMPRESSION: No acute disease.

## 2011-10-29 ENCOUNTER — Other Ambulatory Visit (HOSPITAL_COMMUNITY): Payer: Self-pay | Admitting: Family Medicine

## 2011-10-29 DIAGNOSIS — Z1231 Encounter for screening mammogram for malignant neoplasm of breast: Secondary | ICD-10-CM

## 2011-11-06 ENCOUNTER — Ambulatory Visit (HOSPITAL_COMMUNITY)
Admission: RE | Admit: 2011-11-06 | Discharge: 2011-11-06 | Disposition: A | Discharge status: Home / Self Care | Payer: Medicaid Other | Source: Ambulatory Visit | Attending: Family Medicine | Admitting: Family Medicine

## 2011-11-06 DIAGNOSIS — Z1231 Encounter for screening mammogram for malignant neoplasm of breast: Secondary | ICD-10-CM | POA: Insufficient documentation

## 2011-11-08 ENCOUNTER — Ambulatory Visit (INDEPENDENT_AMBULATORY_CARE_PROVIDER_SITE_OTHER): Payer: Medicaid Other | Admitting: Internal Medicine

## 2011-11-08 DIAGNOSIS — E538 Deficiency of other specified B group vitamins: Secondary | ICD-10-CM

## 2011-11-08 MED ORDER — CYANOCOBALAMIN 1000 MCG/ML IJ SOLN
1000.0000 ug | INTRAMUSCULAR | Status: DC
  Administered 2011-11-08: 1000 ug via INTRAMUSCULAR

## 2011-11-20 ENCOUNTER — Other Ambulatory Visit: Payer: Self-pay

## 2011-11-20 ENCOUNTER — Encounter (HOSPITAL_COMMUNITY): Payer: Self-pay

## 2011-11-20 ENCOUNTER — Observation Stay (HOSPITAL_COMMUNITY): Payer: Medicaid Other

## 2011-11-20 ENCOUNTER — Observation Stay (HOSPITAL_COMMUNITY)
Admission: EM | Admit: 2011-11-20 | Discharge: 2011-11-21 | Disposition: A | Discharge status: Home / Self Care | Payer: Medicaid Other | Attending: Cardiology | Admitting: Cardiology

## 2011-11-20 ENCOUNTER — Emergency Department (HOSPITAL_COMMUNITY)
Admission: EM | Admit: 2011-11-20 | Discharge: 2011-11-20 | Disposition: A | Discharge status: Nursing Home (Includes ICF) | Payer: Medicaid Other | Source: Home / Self Care

## 2011-11-20 ENCOUNTER — Encounter (HOSPITAL_COMMUNITY): Payer: Self-pay | Admitting: Emergency Medicine

## 2011-11-20 DIAGNOSIS — R0602 Shortness of breath: Secondary | ICD-10-CM | POA: Insufficient documentation

## 2011-11-20 DIAGNOSIS — R0600 Dyspnea, unspecified: Secondary | ICD-10-CM

## 2011-11-20 DIAGNOSIS — E119 Type 2 diabetes mellitus without complications: Secondary | ICD-10-CM | POA: Insufficient documentation

## 2011-11-20 DIAGNOSIS — K7689 Other specified diseases of liver: Secondary | ICD-10-CM | POA: Insufficient documentation

## 2011-11-20 DIAGNOSIS — K8020 Calculus of gallbladder without cholecystitis without obstruction: Secondary | ICD-10-CM | POA: Insufficient documentation

## 2011-11-20 DIAGNOSIS — E785 Hyperlipidemia, unspecified: Secondary | ICD-10-CM | POA: Insufficient documentation

## 2011-11-20 DIAGNOSIS — D509 Iron deficiency anemia, unspecified: Secondary | ICD-10-CM | POA: Insufficient documentation

## 2011-11-20 DIAGNOSIS — I10 Essential (primary) hypertension: Secondary | ICD-10-CM | POA: Insufficient documentation

## 2011-11-20 DIAGNOSIS — R079 Chest pain, unspecified: Secondary | ICD-10-CM

## 2011-11-20 DIAGNOSIS — Z87891 Personal history of nicotine dependence: Secondary | ICD-10-CM | POA: Insufficient documentation

## 2011-11-20 DIAGNOSIS — Z860100 Personal history of colon polyps, unspecified: Secondary | ICD-10-CM | POA: Insufficient documentation

## 2011-11-20 DIAGNOSIS — I252 Old myocardial infarction: Secondary | ICD-10-CM | POA: Insufficient documentation

## 2011-11-20 DIAGNOSIS — J45909 Unspecified asthma, uncomplicated: Secondary | ICD-10-CM | POA: Insufficient documentation

## 2011-11-20 DIAGNOSIS — I2510 Atherosclerotic heart disease of native coronary artery without angina pectoris: Secondary | ICD-10-CM | POA: Insufficient documentation

## 2011-11-20 DIAGNOSIS — K219 Gastro-esophageal reflux disease without esophagitis: Secondary | ICD-10-CM | POA: Insufficient documentation

## 2011-11-20 DIAGNOSIS — Z9861 Coronary angioplasty status: Secondary | ICD-10-CM | POA: Insufficient documentation

## 2011-11-20 DIAGNOSIS — R0989 Other specified symptoms and signs involving the circulatory and respiratory systems: Secondary | ICD-10-CM

## 2011-11-20 HISTORY — DX: Unspecified osteoarthritis, unspecified site: M19.90

## 2011-11-20 LAB — CBC
HCT: 35.5 % — ABNORMAL LOW (ref 36.0–46.0)
MCH: 24.5 pg — ABNORMAL LOW (ref 26.0–34.0)
MCHC: 30.1 g/dL (ref 30.0–36.0)
MCV: 81.4 fL (ref 78.0–100.0)
Platelets: 399 10*3/uL (ref 150–400)
RDW: 17.1 % — ABNORMAL HIGH (ref 11.5–15.5)
WBC: 7.1 10*3/uL (ref 4.0–10.5)

## 2011-11-20 LAB — CBC WITH DIFFERENTIAL/PLATELET
Hemoglobin: 10.5 g/dL — ABNORMAL LOW (ref 12.0–15.0)
Lymphocytes Relative: 36 % (ref 12–46)
Lymphs Abs: 2.7 10*3/uL (ref 0.7–4.0)
Monocytes Relative: 11 % (ref 3–12)
Neutro Abs: 3.6 10*3/uL (ref 1.7–7.7)
Neutrophils Relative %: 49 % (ref 43–77)
RBC: 4.29 MIL/uL (ref 3.87–5.11)
WBC: 7.3 10*3/uL (ref 4.0–10.5)

## 2011-11-20 LAB — COMPREHENSIVE METABOLIC PANEL
ALT: 13 U/L (ref 0–35)
AST: 16 U/L (ref 0–37)
AST: 12 U/L (ref 0–37)
Albumin: 3.6 g/dL (ref 3.5–5.2)
Alkaline Phosphatase: 124 U/L — ABNORMAL HIGH (ref 39–117)
BUN: 11 mg/dL (ref 6–23)
CO2: 27 mEq/L (ref 19–32)
Calcium: 9.9 mg/dL (ref 8.4–10.5)
Chloride: 103 mEq/L (ref 96–112)
Creatinine, Ser: 0.94 mg/dL (ref 0.50–1.10)
GFR calc Af Amer: 76 mL/min — ABNORMAL LOW (ref >90)
GFR calc non Af Amer: 66 mL/min — ABNORMAL LOW (ref >90)
Glucose, Bld: 83 mg/dL (ref 70–99)
Potassium: 3.6 mEq/L (ref 3.5–5.1)
Sodium: 140 mEq/L (ref 135–145)
Total Bilirubin: 0.3 mg/dL (ref 0.3–1.2)
Total Protein: 8.5 g/dL — ABNORMAL HIGH (ref 6.0–8.3)

## 2011-11-20 LAB — TROPONIN I
Troponin I: <0.3 ng/mL (ref <0.30)
Troponin I: <0.3 ng/mL (ref <0.30)

## 2011-11-20 LAB — PROTIME-INR
INR: 0.96 (ref 0.00–1.49)
Prothrombin Time: 12.3 seconds (ref 11.6–15.2)

## 2011-11-20 LAB — APTT: aPTT: 33 seconds (ref 24–37)

## 2011-11-20 LAB — HEPARIN LEVEL (UNFRACTIONATED): Heparin Unfractionated: 0.37 IU/mL (ref 0.30–0.70)

## 2011-11-20 MED ORDER — HEPARIN BOLUS VIA INFUSION
3500.0000 [IU] | Freq: Once | INTRAVENOUS | Status: AC
  Administered 2011-11-20: 3500 [IU] via INTRAVENOUS
  Filled 2011-11-20: qty 3500

## 2011-11-20 MED ORDER — BUDESONIDE-FORMOTEROL FUMARATE 160-4.5 MCG/ACT IN AERO
2.0000 | INHALATION_SPRAY | Freq: Two times a day (BID) | RESPIRATORY_TRACT | Status: DC
  Administered 2011-11-20: 2 via RESPIRATORY_TRACT
  Filled 2011-11-20: qty 6

## 2011-11-20 MED ORDER — NITROGLYCERIN 0.4 MG SL SUBL: 0.4000 mg | SUBLINGUAL_TABLET | SUBLINGUAL | Status: DC | PRN

## 2011-11-20 MED ORDER — ASPIRIN 300 MG RE SUPP
300.0000 mg | RECTAL | Status: AC
  Filled 2011-11-20: qty 1

## 2011-11-20 MED ORDER — INSULIN ASPART 100 UNIT/ML ~~LOC~~ SOLN: 0.0000 [IU] | Freq: Three times a day (TID) | SUBCUTANEOUS | Status: DC

## 2011-11-20 MED ORDER — NITROGLYCERIN IN D5W 200-5 MCG/ML-% IV SOLN
5.0000 ug/min | INTRAVENOUS | Status: DC
  Administered 2011-11-20: 5 ug/min via INTRAVENOUS
  Filled 2011-11-20: qty 250

## 2011-11-20 MED ORDER — ASPIRIN 81 MG PO TABS: 81.0000 mg | ORAL_TABLET | Freq: Every day | ORAL | Status: DC

## 2011-11-20 MED ORDER — ASPIRIN 81 MG PO CHEW
324.0000 mg | CHEWABLE_TABLET | Freq: Once | ORAL | Status: AC
  Administered 2011-11-20: 324 mg via ORAL

## 2011-11-20 MED ORDER — ASPIRIN 81 MG PO CHEW
CHEWABLE_TABLET | ORAL | Status: AC
  Filled 2011-11-20: qty 4

## 2011-11-20 MED ORDER — ATORVASTATIN CALCIUM 20 MG PO TABS
20.0000 mg | ORAL_TABLET | Freq: Every day | ORAL | Status: DC
  Administered 2011-11-20 – 2011-11-21 (×2): 20 mg via ORAL
  Filled 2011-11-20 (×2): qty 1

## 2011-11-20 MED ORDER — ALBUTEROL SULFATE (5 MG/ML) 0.5% IN NEBU
INHALATION_SOLUTION | RESPIRATORY_TRACT | Status: AC
  Filled 2011-11-20: qty 0.5

## 2011-11-20 MED ORDER — ASPIRIN EC 81 MG PO TBEC
81.0000 mg | DELAYED_RELEASE_TABLET | Freq: Every day | ORAL | Status: DC
  Administered 2011-11-21: 81 mg via ORAL
  Filled 2011-11-20: qty 1

## 2011-11-20 MED ORDER — PANTOPRAZOLE SODIUM 40 MG PO TBEC
40.0000 mg | DELAYED_RELEASE_TABLET | Freq: Every day | ORAL | Status: DC
  Administered 2011-11-20 – 2011-11-21 (×2): 40 mg via ORAL
  Filled 2011-11-20 (×2): qty 1

## 2011-11-20 MED ORDER — SODIUM CHLORIDE 0.9 % IV SOLN
INTRAVENOUS | Status: DC
  Administered 2011-11-20: 14:00:00 via INTRAVENOUS

## 2011-11-20 MED ORDER — METFORMIN HCL 500 MG PO TABS
500.0000 mg | ORAL_TABLET | Freq: Two times a day (BID) | ORAL | Status: DC
  Administered 2011-11-20 – 2011-11-21 (×2): 500 mg via ORAL
  Filled 2011-11-20 (×4): qty 1

## 2011-11-20 MED ORDER — AMLODIPINE BESYLATE 5 MG PO TABS
5.0000 mg | ORAL_TABLET | Freq: Every day | ORAL | Status: DC
  Administered 2011-11-20 – 2011-11-21 (×2): 5 mg via ORAL
  Filled 2011-11-20 (×2): qty 1

## 2011-11-20 MED ORDER — HEPARIN (PORCINE) IN NACL 100-0.45 UNIT/ML-% IJ SOLN
1050.0000 [IU]/h | INTRAMUSCULAR | Status: DC
  Administered 2011-11-20: 700 [IU]/h via INTRAVENOUS
  Administered 2011-11-21: 850 [IU]/h via INTRAVENOUS
  Administered 2011-11-21: 1050 [IU]/h via INTRAVENOUS
  Filled 2011-11-20 (×2): qty 250

## 2011-11-20 MED ORDER — ONDANSETRON HCL 4 MG/2ML IJ SOLN: 4.0000 mg | Freq: Four times a day (QID) | INTRAMUSCULAR | Status: DC | PRN

## 2011-11-20 MED ORDER — CARVEDILOL 3.125 MG PO TABS
3.1250 mg | ORAL_TABLET | Freq: Two times a day (BID) | ORAL | Status: DC
  Administered 2011-11-20 – 2011-11-21 (×2): 3.125 mg via ORAL
  Filled 2011-11-20 (×4): qty 1

## 2011-11-20 MED ORDER — INSULIN GLARGINE 100 UNIT/ML ~~LOC~~ SOLN
20.0000 [IU] | Freq: Every day | SUBCUTANEOUS | Status: DC
  Administered 2011-11-20: 20 [IU] via SUBCUTANEOUS

## 2011-11-20 MED ORDER — ACETAMINOPHEN 325 MG PO TABS
650.0000 mg | ORAL_TABLET | ORAL | Status: DC | PRN
  Administered 2011-11-20: 650 mg via ORAL
  Filled 2011-11-20: qty 2

## 2011-11-20 MED ORDER — ASPIRIN 81 MG PO CHEW
324.0000 mg | CHEWABLE_TABLET | ORAL | Status: AC
  Administered 2011-11-20: 324 mg via ORAL
  Filled 2011-11-20: qty 4

## 2011-11-20 MED ORDER — ASPIRIN 81 MG PO CHEW: 81.0000 mg | CHEWABLE_TABLET | Freq: Every day | ORAL | Status: DC

## 2011-11-20 NOTE — ED Provider Notes (Signed)
Medical screening examination/treatment/procedure(s) were performed by resident physician or non-physician practitioner and as supervising physician I was immediately available for consultation/collaboration.   Pauline Good MD.    Billy Fischer, MD 11/20/11 [phone]

## 2011-11-20 NOTE — ED Notes (Signed)
Chest pain x 1 week, worsened today, went to urgent care and transferred here via EMS.  Shob with crackles reported by EMS.

## 2011-11-20 NOTE — ED Notes (Signed)
C/o chest pains x1 week w/SOB... Pt has hx of asthma... Pt is breathing fast... Sx: include: headaches, nauseas ...  Denies: fevers, vomiting, diarrhea.. Has taken her albuterol tx today at 09:00 w/no relief.

## 2011-11-20 NOTE — ED Provider Notes (Signed)
History     CSN: [id number]  Arrival date & time 11/20/11  1220   First MD Initiated Contact with Patient 11/20/11 1243      Chief Complaint  Patient presents with  . Chest Pain    (Consider location/radiation/quality/duration/timing/severity/associated sxs/prior treatment) Patient is a 61 y.o. female presenting with chest pain. The history is provided by the patient.  Chest Pain Primary symptoms include shortness of breath. Pertinent negatives for primary symptoms include no fever, no palpitations and no abdominal pain.   pt c/o mid chest pain for past week. Comes and goes. Denies specific exacerbating or alleviating factors. Mild sob. No nv or diaphoresis. Is unsure if same symptoms previously. Hx cad. Pt denies cough or uri c/o. No fever or chills. Pain does not radiating, no jaw, neck, back, or arm pain. Denies leg pain or swelling.  No pleuritic pain. No hx dvt or pe.        Past Medical History  Diagnosis Date  . History of colonic polyps   . GERD (gastroesophageal reflux disease)   . Hyperlipidemia   . Diabetes mellitus without mention of complication   . Hypertension   . Myocardial infarction   . Coronary artery disease   . Fatty liver disease, nonalcoholic   . Cholelithiasis   . Iron deficiency anemia   . Angina   . Shortness of breath   . Asthma     Past Surgical History  Procedure Date  . Right colectomy     After colonic perforation  . Tubal ligation   . Coronary angioplasty with stent placement     Family History  Problem Relation Age of Onset  . Heart disease Mother   . Diabetes Mother   . Colon cancer Neg Hx   . Cancer Father     unsure what kind  . Diabetes Sister     History  Substance Use Topics  . Smoking status: Former Smoker -- 0.5 packs/day for 47 years    Types: Cigarettes    Quit date: 05/09/2010  . Smokeless tobacco: Former Systems developer    Quit date: 03/18/2010  . Alcohol Use: No    OB History    Grav Para Term Preterm Abortions  TAB SAB Ect Mult Living                  Review of Systems  Constitutional: Negative for fever and chills.  HENT: Negative for neck pain.   Eyes: Negative for redness.  Respiratory: Positive for shortness of breath.   Cardiovascular: Positive for chest pain. Negative for palpitations and leg swelling.  Gastrointestinal: Negative for abdominal pain.  Genitourinary: Negative for flank pain.  Musculoskeletal: Negative for back pain.  Skin: Negative for rash.  Neurological: Negative for headaches.  Hematological: Does not bruise/bleed easily.  Psychiatric/Behavioral: Negative for confusion.    Allergies  Review of patient's allergies indicates no known allergies.  Home Medications   Current Outpatient Rx  Name Route Sig Dispense Refill  . ALBUTEROL SULFATE HFA 108 (90 BASE) MCG/ACT IN AERS Inhalation Inhale 2 puffs into the lungs every 6 (six) hours as needed. For shortness of breath    . AMLODIPINE BESYLATE 5 MG PO TABS Oral Take 5 mg by mouth daily.      . ASPIRIN 81 MG PO TABS Oral Take 81 mg by mouth daily.      . ATORVASTATIN CALCIUM 20 MG PO TABS Oral Take 20 mg by mouth daily.    . BUDESONIDE-FORMOTEROL FUMARATE 160-4.5  MCG/ACT IN AERO Inhalation Inhale 2 puffs into the lungs 2 (two) times daily.    Marland Kitchen CARVEDILOL 6.25 MG PO TABS Oral Take 0.5 tablets (3.125 mg total) by mouth 2 (two) times daily with a meal. 60 tablet 3  . CHLORPHENIRAMINE MALEATE 4 MG PO TABS Oral Take 8 mg by mouth at bedtime.    . INSULIN GLARGINE 100 UNIT/ML [place] SOLN Subcutaneous Inject 40 Units into the skin at bedtime.    Marland Kitchen METFORMIN HCL 500 MG PO TABS Oral Take 500 mg by mouth 2 (two) times daily with a meal.    . MOVIPREP 100 G PO SOLR  Use per prep instructions 1 kit 0    Dispense as written.  Marland Kitchen NITROGLYCERIN 0.4 MG SL SUBL Sublingual Place 0.4 mg under the tongue every 5 (five) minutes as needed. As needed     . OMEPRAZOLE 20 MG PO CPDR Oral Take 20 mg by mouth 2 (two) times daily.     Marland Kitchen ONDANSETRON  HCL 4 MG PO TABS Oral Take 4 mg by mouth daily as needed. As needed for nausea    . PULMICORT 0.5 MG/2ML IN SUSP  1 vial in nebulizer at bedtime    . TRAMADOL HCL 50 MG PO TABS Oral Take 50 mg by mouth 2 (two) times daily as needed. For pain      BP 129/81  Pulse 69  Temp 98.2 F (36.8 C) (Oral)  Resp 24  SpO2 100%  Physical Exam  Nursing note and vitals reviewed. Constitutional: She is oriented to person, place, and time. She appears well-developed and well-nourished. No distress.  HENT:  Mouth/Throat: Oropharynx is clear and moist.  Eyes: Conjunctivae normal are normal. No scleral icterus.  Neck: Neck supple. No JVD present. No tracheal deviation present.  Cardiovascular: Normal rate, regular rhythm, normal heart sounds and intact distal pulses.  Exam reveals no gallop and no friction rub.   No murmur heard. Pulmonary/Chest: Effort normal and breath sounds normal. No respiratory distress. She exhibits tenderness.  Abdominal: Soft. Normal appearance and bowel sounds are normal. She exhibits no distension. There is no tenderness.  Musculoskeletal: She exhibits no edema and no tenderness.  Neurological: She is alert and oriented to person, place, and time.  Skin: Skin is warm and dry. No rash noted.  Psychiatric: She has a normal mood and affect.    ED Course  Procedures (including critical care time)   Results for orders placed during the hospital encounter of 11/20/11  CBC      Component Value Range   WBC 7.1  4.0 - 10.5 K/uL   RBC 4.36  3.87 - 5.11 MIL/uL   Hemoglobin 10.7 (*) 12.0 - 15.0 g/dL   HCT 35.5 (*) 36.0 - 46.0 %   MCV 81.4  78.0 - 100.0 fL   MCH 24.5 (*) 26.0 - 34.0 pg   MCHC 30.1  30.0 - 36.0 g/dL   RDW 17.1 (*) 11.5 - 15.5 %   Platelets 399  150 - 400 K/uL  COMPREHENSIVE METABOLIC PANEL      Component Value Range   Sodium 139  135 - 145 mEq/L   Potassium 3.9  3.5 - 5.1 mEq/L   Chloride 103  96 - 112 mEq/L   CO2 26  19 - 32 mEq/L   Glucose, Bld 69 (*)  70 - 99 mg/dL   BUN 11  6 - 23 mg/dL   Creatinine, Ser 0.94  0.50 - 1.10 mg/dL   Calcium  9.9  8.4 - 10.5 mg/dL   Total Protein 8.5 (*) 6.0 - 8.3 g/dL   Albumin 3.6  3.5 - 5.2 g/dL   AST 16  0 - 37 U/L   ALT 14  0 - 35 U/L   Alkaline Phosphatase 124 (*) 39 - 117 U/L   Total Bilirubin 0.3  0.3 - 1.2 mg/dL   GFR calc non Af Amer 63 (*) >90 mL/min   GFR calc Af Amer 73 (*) >90 mL/min  TROPONIN I      Component Value Range   Troponin I <0.30  <0.30 ng/mL  PROTIME-INR      Component Value Range   Prothrombin Time 12.3  11.6 - 15.2 seconds   INR 0.92  0.00 - 1.49   Dg Chest 2 View  11/20/2011  *RADIOLOGY REPORT*  Clinical Data: Chest pain and shortness of breath.  CHEST - 2 VIEW  Comparison: 05/06/2011.  Findings: The cardiac silhouette, mediastinal and hilar contours are within normal limits and stable.  There is tortuosity and calcification of the thoracic aorta.  The lungs are clear.  No pleural effusion.  Bony thorax is intact.  IMPRESSION: No acute cardiopulmonary findings.   Original Report Authenticated By: P. Kalman Jewels, M.D.      MDM  Iv ns. Monitor. Pulse ox. Labs. Ecg. Cxr.   Date: 11/20/2011  Rate: 69  Rhythm: normal sinus rhythm  QRS Axis: left  Intervals: normal  ST/T Wave abnormalities: normal  Conduction Disutrbances:none  Narrative Interpretation:   Old EKG Reviewed: unchanged   Recheck no cp. Dr Terrence Dupont has seen in ed and admitted.        Mirna Mires, MD 11/20/11 [phone]

## 2011-11-20 NOTE — Progress Notes (Signed)
ANTICOAGULATION CONSULT NOTE - Follow Up Consult  Pharmacy Consult for heparin Indication: chest pain/ACS  Labs:  [facility] 11/20/11 2204 11/20/11 2129 11/20/11 1545 11/20/11 1315  HGB -- -- 10.5* 10.7*  HCT -- -- 35.3* 35.5*  PLT -- -- 405* 399  APTT -- -- 33 --  LABPROT -- -- 12.7 12.3  INR -- -- 0.96 0.92  HEPARINUNFRC 0.37 -- -- --  CREATININE -- -- 0.91 0.94  CKTOTAL -- -- -- --  CKMB -- -- -- --  TROPONINI -- <0.30 <0.30 <0.30    Assessment/Plan:  61yo female therapeutic on heparin with initial dosing for CP.  Will continue gtt at current rate and confirm stable with am labs.  Rogue Bussing PharmD BCPS 11/20/2011,11:01 PM

## 2011-11-20 NOTE — ED Provider Notes (Signed)
History     CSN: [id number]  Arrival date & time 11/20/11  1032   None     Chief Complaint  Patient presents with  . Chest Pain    (Consider location/radiation/quality/duration/timing/severity/associated sxs/prior treatment) HPI Comments: This 61 year old female presents with chest pain and tachypnea progressing over the past week. She describes the chest pain as heaviness and tightness in the precordial chest. Her respirations are somewhat labored shallow at and tachypneic at a rate of 40. She has a history of recent diaphoretic episodes the she is warm and dry now. No nausea vomiting or other GI symptoms. Pulse ox is 98% She has a history of CAD and MI. He is conscious and alert and in mild distress.   Past Medical History  Diagnosis Date  . History of colonic polyps   . GERD (gastroesophageal reflux disease)   . Hyperlipidemia   . Diabetes mellitus without mention of complication   . Hypertension   . Myocardial infarction   . Coronary artery disease   . Fatty liver disease, nonalcoholic   . Cholelithiasis   . Iron deficiency anemia   . Angina   . Shortness of breath   . Asthma     Past Surgical History  Procedure Date  . Right colectomy     After colonic perforation  . Tubal ligation   . Coronary angioplasty with stent placement     Family History  Problem Relation Age of Onset  . Heart disease Mother   . Diabetes Mother   . Colon cancer Neg Hx   . Cancer Father     unsure what kind  . Diabetes Sister     History  Substance Use Topics  . Smoking status: Former Smoker -- 0.5 packs/day for 47 years    Types: Cigarettes    Quit date: 05/09/2010  . Smokeless tobacco: Former Systems developer    Quit date: 03/18/2010  . Alcohol Use: No    OB History    Grav Para Term Preterm Abortions TAB SAB Ect Mult Living                  Review of Systems  Constitutional: Positive for activity change and fatigue. Negative for fever.  HENT: Negative.   Respiratory:  Positive for chest tightness and shortness of breath. Negative for cough and wheezing.   Cardiovascular: Positive for chest pain. Negative for leg swelling.  Gastrointestinal: Negative.   Genitourinary: Negative.   Musculoskeletal: Negative for back pain.  Skin: Negative for rash.  Neurological: Negative.     Allergies  Review of patient's allergies indicates no known allergies.  Home Medications   Current Outpatient Rx  Name Route Sig Dispense Refill  . ALBUTEROL SULFATE HFA 108 (90 BASE) MCG/ACT IN AERS Inhalation Inhale 2 puffs into the lungs every 6 (six) hours as needed. For shortness of breath    . AMLODIPINE BESYLATE 5 MG PO TABS Oral Take 5 mg by mouth daily.      . ASPIRIN 81 MG PO TABS Oral Take 81 mg by mouth daily.      . ATORVASTATIN CALCIUM 20 MG PO TABS Oral Take 20 mg by mouth daily.    . BUDESONIDE-FORMOTEROL FUMARATE 160-4.5 MCG/ACT IN AERO Inhalation Inhale 2 puffs into the lungs 2 (two) times daily.    Marland Kitchen CARVEDILOL 6.25 MG PO TABS Oral Take 0.5 tablets (3.125 mg total) by mouth 2 (two) times daily with a meal. 60 tablet 3  . INSULIN GLARGINE 100 UNIT/ML   SOLN Subcutaneous Inject 40 Units into the skin at bedtime.    Marland Kitchen METFORMIN HCL 500 MG PO TABS Oral Take 500 mg by mouth 2 (two) times daily with a meal.    . NITROGLYCERIN 0.4 MG SL SUBL Sublingual Place 0.4 mg under the tongue every 5 (five) minutes as needed. As needed     . OMEPRAZOLE 20 MG PO CPDR Oral Take 20 mg by mouth 2 (two) times daily.     . CHLORPHENIRAMINE MALEATE 4 MG PO TABS Oral Take 8 mg by mouth at bedtime.    Marland Kitchen MOVIPREP 100 G PO SOLR  Use per prep instructions 1 kit 0    Dispense as written.  Marland Kitchen ONDANSETRON HCL 4 MG PO TABS Oral Take 4 mg by mouth daily as needed. As needed for nausea    . PULMICORT 0.5 MG/2ML IN SUSP  1 vial in nebulizer at bedtime    . TRAMADOL HCL 50 MG PO TABS Oral Take 50 mg by mouth 2 (two) times daily as needed. For pain      BP 171/79  Pulse 82  Temp 97.9 F (36.6  C) (Oral)  Resp 54  SpO2 98%  Physical Exam  Constitutional: She is oriented to person, place, and time. She appears well-developed. She appears distressed.  Neck: Normal range of motion. Neck supple.  Cardiovascular: Normal rate, regular rhythm and normal heart sounds.   Pulmonary/Chest: She is in respiratory distress. She has no wheezes.       Tachypnea with shallow respirations, no wheezes.  Musculoskeletal: Normal range of motion. She exhibits no edema.  Neurological: She is alert and oriented to person, place, and time. No cranial nerve deficit.  Skin: Skin is warm and dry. No erythema.    ED Course  Procedures (including critical care time)  Labs Reviewed - No data to display No results found.   1. Chest pain   2. Dyspnea       MDM  EKG  normal sinus rhythm when compared to the EKG in March the only change is an elevation in lead V2 with slight coating of the ST segment. She continues to have a left axis deviation  ASA 324mg  po O2 2L/min  Dr. Kasandra Knudsen was called and spoke with him. Advised of EKG with the single lead change.  He requested Heparin and NTG drip. And send to ED, as we had already decided.           Janne Napoleon, NP 11/20/11 1153

## 2011-11-20 NOTE — Progress Notes (Signed)
Utilization review completed.  

## 2011-11-20 NOTE — H&P (Signed)
[name] is an 61 y.o. female.   Chief Complaint: Recurrent chest pain HPI: Patient is 61 year old female with past medical history significant for multiple medical problems i.e. coronary artery disease, status post PTCA stenting to left circumflex in 2008 and PTCA stenting to RCA in 2009, hypertension, insulin requiring diabetes mellitus, hypercholesteremia, GERD, history of tobacco abuse, COPD, history of bronchial asthma, history of colonic polyp, chronic anemia, she came to urgent care Center complaining of recurrent retrosternal and left-sided chest pain radiating to left shoulder and left arm off and on for last 2 weeks associated with nausea diaphoresis and mild shortness of breath states chest pain lasts about 15-20 minutes her pain was grade 4/10 and described as pushing and pressure. Patient states initially she thought it is due to acid reflux took some antacids without relief and has chest pain got progressive worse so decided to come to urgent care. Patient denies any relation of chest pain to food breathing or movement denies any cough fever chills. Patient had left cath done in January of 2013 which showed moderate left circumflex stenosis with patent stents.  Past Medical History  Diagnosis Date  . History of colonic polyps   . GERD (gastroesophageal reflux disease)   . Hyperlipidemia   . Diabetes mellitus without mention of complication   . Hypertension   . Myocardial infarction   . Coronary artery disease   . Fatty liver disease, nonalcoholic   . Cholelithiasis   . Iron deficiency anemia   . Angina   . Shortness of breath   . Asthma     Past Surgical History  Procedure Date  . Right colectomy     After colonic perforation  . Tubal ligation   . Coronary angioplasty with stent placement     Family History  Problem Relation Age of Onset  . Heart disease Mother   . Diabetes Mother   . Colon cancer Neg Hx   . Cancer Father     unsure what kind  . Diabetes Sister     Social History:  reports that she quit smoking about 18 months ago. Her smoking use included Cigarettes. She has a 23.5 pack-year smoking history. She quit smokeless tobacco use about 20 months ago. She reports that she does not drink alcohol or use illicit drugs.  Allergies: No Known Allergies   (Not in a hospital admission)  No results found for this or any previous visit (from the past 48 hour(s)). No results found.  Review of Systems  Constitutional: Negative for fever and chills.  HENT: Negative for hearing loss.   Eyes: Negative for blurred vision and double vision.  Respiratory: Negative for cough, hemoptysis and sputum production.   Cardiovascular: Positive for chest pain. Negative for palpitations, orthopnea, claudication and leg swelling.  Gastrointestinal: Positive for nausea. Negative for vomiting, abdominal pain and diarrhea.  Skin: Negative for rash.  Neurological: Negative for dizziness and headaches.    Blood pressure 129/81, pulse 69, temperature 98.2 F (36.8 C), temperature source Oral, resp. rate 24, SpO2 100.00%. Physical Exam  Constitutional: She is oriented to person, place, and time. She appears well-developed.  HENT:  Head: Normocephalic and atraumatic.  Eyes: Conjunctivae normal are normal. Pupils are equal, round, and reactive to light. Left eye exhibits no discharge. No scleral icterus.  Neck: Neck supple. No JVD present. No tracheal deviation present. No thyromegaly present.  Cardiovascular: Normal rate and regular rhythm.   Murmur (Soft systolic murmur noted no S3 gallop) heard. Respiratory:  Effort normal and breath sounds normal. No respiratory distress. She has no wheezes.  GI: Soft. Bowel sounds are normal. She exhibits no distension. There is no tenderness. There is no rebound.  Musculoskeletal: She exhibits no edema and no tenderness.  Neurological: She is alert and oriented to person, place, and time.     Assessment/Plan Recurrent chest  pain rule out progression of coronary artery disease/rule out MI Coronary artery disease history of PCI to RCA and left circumflex in the past  Insulin requiring diabetes mellitus Hypertension Hypercholesteremia GERD History of tobacco abuse COPD History of bronchial asthma History of colonic polyp Chronic anemia Plan As per orders Maykayla Highley N 11/20/2011, 12:37 PM

## 2011-11-20 NOTE — ED Notes (Signed)
[facility] no truck  - [facility] called for transport. Charge nurse Aspinwall report

## 2011-11-20 NOTE — ED Notes (Addendum)
IV at 1152 removed approx 1 minute after placement - re: vein blew when flow rate increased.

## 2011-11-20 NOTE — Progress Notes (Signed)
ANTICOAGULATION CONSULT NOTE - Initial Consult  Pharmacy Consult for Heparin Indication: chest pain/ACS  No Known Allergies  Patient Measurements: Height: 59 in Weight: 62.9 kg Heparin Dosing Weight: 58.7 kg  Vital Signs: Temp: 98.2 F (36.8 C) (10/15 1222) Temp src: Oral (10/15 1222) BP: 154/90 mmHg (10/15 1401) Pulse Rate: 62  (10/15 1345)  Labs:  Basename 11/20/11 1315  HGB 10.7*  HCT 35.5*  PLT 399  APTT --  LABPROT 12.3  INR 0.92  HEPARINUNFRC --  CREATININE 0.94  CKTOTAL --  CKMB --  TROPONINI <0.30    The CrCl is unknown because both a height and weight (above a minimum accepted value) are required for this calculation.   Medical History: Past Medical History  Diagnosis Date  . History of colonic polyps   . GERD (gastroesophageal reflux disease)   . Hyperlipidemia   . Diabetes mellitus without mention of complication   . Hypertension   . Myocardial infarction   . Coronary artery disease   . Fatty liver disease, nonalcoholic   . Cholelithiasis   . Iron deficiency anemia   . Angina   . Shortness of breath   . Asthma     Medications:  Prescriptions prior to admission  Medication Sig Dispense Refill  . albuterol (PROVENTIL HFA;VENTOLIN HFA) 108 (90 BASE) MCG/ACT inhaler Inhale 2 puffs into the lungs every 6 (six) hours as needed. For shortness of breath      . amLODipine (NORVASC) 5 MG tablet Take 5 mg by mouth daily.        Marland Kitchen aspirin 81 MG tablet Take 81 mg by mouth daily.        Marland Kitchen atorvastatin (LIPITOR) 20 MG tablet Take 20 mg by mouth daily.      . budesonide-formoterol (SYMBICORT) 160-4.5 MCG/ACT inhaler Inhale 2 puffs into the lungs 2 (two) times daily.      . carvedilol (COREG) 6.25 MG tablet Take 0.5 tablets (3.125 mg total) by mouth 2 (two) times daily with a meal.  60 tablet  3  . chlorpheniramine (CHLOR-TRIMETON) 4 MG tablet Take 8 mg by mouth at bedtime.      . insulin glargine (LANTUS) 100 UNIT/ML injection Inject 40 Units into the skin  at bedtime.      . metFORMIN (GLUCOPHAGE) 500 MG tablet Take 500 mg by mouth 2 (two) times daily with a meal.      . MOVIPREP 100 G SOLR Use per prep instructions  1 kit  0  . nitroGLYCERIN (NITROSTAT) 0.4 MG SL tablet Place 0.4 mg under the tongue every 5 (five) minutes as needed. As needed       . omeprazole (PRILOSEC) 20 MG capsule Take 20 mg by mouth 2 (two) times daily.       . ondansetron (ZOFRAN) 4 MG tablet Take 4 mg by mouth daily as needed. As needed for nausea      . PULMICORT 0.5 MG/2ML nebulizer solution 1 vial in nebulizer at bedtime      . traMADol (ULTRAM) 50 MG tablet Take 50 mg by mouth 2 (two) times daily as needed. For pain        Assessment: 61 yo female with a history of CAD who presents with intermittent CP over the last 2 weeks. Cath in Jan 2013 showed moderate left circumflex stenosis  with patent stents. Pharmacy consulted to begin heparin. H/H are low on admit and platelets are normal.  Goal of Therapy:  Heparin level 0.3-0.7 units/ml Monitor platelets by  anticoagulation protocol: Yes   Plan:  -Heparin 3500 units IV bolus then infuse at 700 units/hr Heparin level 6 hours after started -Daily heparin level and CBC while on heparin   [facility], Dawson.D., BCPS Clinical Pharmacist Pager: [phone] 11/20/2011 3:29 PM

## 2011-11-21 ENCOUNTER — Observation Stay (HOSPITAL_COMMUNITY): Payer: Medicaid Other

## 2011-11-21 ENCOUNTER — Other Ambulatory Visit: Payer: Self-pay

## 2011-11-21 LAB — CBC
HCT: 32.6 % — ABNORMAL LOW (ref 36.0–46.0)
Hemoglobin: 9.6 g/dL — ABNORMAL LOW (ref 12.0–15.0)
MCV: 81.1 fL (ref 78.0–100.0)
RDW: 17.1 % — ABNORMAL HIGH (ref 11.5–15.5)
WBC: 6.6 10*3/uL (ref 4.0–10.5)

## 2011-11-21 LAB — BASIC METABOLIC PANEL
BUN: 12 mg/dL (ref 6–23)
CO2: 22 mEq/L (ref 19–32)
Chloride: 106 mEq/L (ref 96–112)
Creatinine, Ser: 0.91 mg/dL (ref 0.50–1.10)
Potassium: 3.6 mEq/L (ref 3.5–5.1)

## 2011-11-21 LAB — HEPARIN LEVEL (UNFRACTIONATED): Heparin Unfractionated: 0.17 IU/mL — ABNORMAL LOW (ref 0.30–0.70)

## 2011-11-21 LAB — LIPID PANEL
HDL: 28 mg/dL — ABNORMAL LOW (ref >39)
LDL Cholesterol: 36 mg/dL (ref 0–99)
Total CHOL/HDL Ratio: 3.3 RATIO
VLDL: 27 mg/dL (ref 0–40)

## 2011-11-21 LAB — GLUCOSE, CAPILLARY: Glucose-Capillary: 122 mg/dL — ABNORMAL HIGH (ref 70–99)

## 2011-11-21 MED ORDER — TECHNETIUM TC 99M SESTAMIBI GENERIC - CARDIOLITE
10.0000 | Freq: Once | INTRAVENOUS | Status: AC | PRN
  Administered 2011-11-21: 10 via INTRAVENOUS

## 2011-11-21 MED ORDER — REGADENOSON 0.4 MG/5ML IV SOLN
INTRAVENOUS | Status: AC
  Administered 2011-11-21: 0.4 mg via INTRAVENOUS
  Filled 2011-11-21: qty 5

## 2011-11-21 MED ORDER — TECHNETIUM TC 99M SESTAMIBI GENERIC - CARDIOLITE
30.0000 | Freq: Once | INTRAVENOUS | Status: AC | PRN
  Administered 2011-11-21: 30 via INTRAVENOUS

## 2011-11-21 MED ORDER — REGADENOSON 0.4 MG/5ML IV SOLN
0.4000 mg | Freq: Once | INTRAVENOUS | Status: AC
  Administered 2011-11-21: 0.4 mg via INTRAVENOUS
  Filled 2011-11-21: qty 5

## 2011-11-21 MED ORDER — HEPARIN BOLUS VIA INFUSION
1000.0000 [IU] | Freq: Once | INTRAVENOUS | Status: AC
  Administered 2011-11-21: 1000 [IU] via INTRAVENOUS
  Filled 2011-11-21: qty 1000

## 2011-11-21 MED ORDER — HEPARIN BOLUS VIA INFUSION
2000.0000 [IU] | Freq: Once | INTRAVENOUS | Status: AC
  Administered 2011-11-21: 2000 [IU] via INTRAVENOUS
  Filled 2011-11-21: qty 2000

## 2011-11-21 NOTE — Progress Notes (Signed)
Subjective:  Patient complains of vague chest pain off and on without associated symptoms. Cardiac enzymes so far has been negative. Scheduled for nuclear stress test today  Objective:  Vital Signs in the last 24 hours: Temp:  [97.6 F (36.4 C)-98.4 F (36.9 C)] 98.4 F (36.9 C) (10/16 0508) Pulse Rate:  [62-74] 74  (10/16 0508) Resp:  [13-24] 17  (10/16 0508) BP: (129-182)/(59-90) 142/67 mmHg (10/16 1037) SpO2:  [98 %-100 %] 100 % (10/16 0508) Weight:  [65.545 kg (144 lb 8 oz)] 65.545 kg (144 lb 8 oz) (10/15 1539)  Intake/Output from previous day: 10/15 0701 - 10/16 0700 In: 180 [P.O.:180] Out: 75 [Urine:75] Intake/Output from this shift:    Physical Exam: Neck: no adenopathy, no carotid bruit, no JVD and supple, symmetrical, trachea midline Lungs: clear to auscultation bilaterally Heart: regular rate and rhythm, S1, S2 normal and Soft systolic murmur noted Abdomen: soft, non-tender; bowel sounds normal; no masses,  no organomegaly Extremities: extremities normal, atraumatic, no cyanosis or edema  Lab Results:  [facility] 11/21/11 0515 11/20/11 1545  WBC 6.6 7.3  HGB 9.6* 10.5*  PLT 370 405*    Basename 11/21/11 0515 11/20/11 1545  NA 139 140  K 3.6 3.6  CL 106 103  CO2 22 27  GLUCOSE 117* 83  BUN 12 11  CREATININE 0.91 0.91    Basename 11/21/11 0515 11/20/11 2129  TROPONINI <0.30 <0.30   Hepatic Function Panel  Basename 11/20/11 1545  PROT 8.5*  ALBUMIN 3.6  AST 12  ALT 13  ALKPHOS 124*  BILITOT 0.3  BILIDIR --  IBILI --    Basename 11/21/11 0515  CHOL 91   No results found for this basename: PROTIME in the last 72 hours  Imaging: Imaging results have been reviewed and Dg Chest 2 View  11/20/2011  *RADIOLOGY REPORT*  Clinical Data: Chest pain and shortness of breath.  CHEST - 2 VIEW  Comparison: 05/06/2011.  Findings: The cardiac silhouette, mediastinal and hilar contours are within normal limits and stable.  There is tortuosity and calcification  of the thoracic aorta.  The lungs are clear.  No pleural effusion.  Bony thorax is intact.  IMPRESSION: No acute cardiopulmonary findings.   Original Report Authenticated By: P. Kalman Jewels, M.D.     Cardiac Studies:  Assessment/Plan:  Stable angina MI ruled out Coronary artery disease history of PCI to RCA and left circumflex in the past  Insulin requiring diabetes mellitus  Hypertension  Hypercholesteremia  GERD  History of tobacco abuse  COPD  History of bronchial asthma  History of colonic polyp  Chronic anemia  Plan Schedule for nuclear stress test today  continue present management  LOS: 1 day    Kaislyn Gulas N 11/21/2011, 11:02 AM

## 2011-11-21 NOTE — Progress Notes (Signed)
Utilization review completed.  

## 2011-11-21 NOTE — Discharge Summary (Signed)
  Discharge summary dictated on 11/21/2011 dictation number is [phone]

## 2011-11-21 NOTE — Progress Notes (Signed)
ANTICOAGULATION CONSULT NOTE - Follow Up Consult  Pharmacy Consult for Heparin Indication: chest pain/ACS  No Known Allergies   Labs:  Basename 11/21/11 1339 11/21/11 0515 11/20/11 2204 11/20/11 2129 11/20/11 1545 11/20/11 1315  HGB -- 9.6* -- -- 10.5* --  HCT -- 32.6* -- -- 35.3* 35.5*  PLT -- 370 -- -- 405* 399  APTT -- -- -- -- 33 --  LABPROT -- -- -- -- 12.7 12.3  INR -- -- -- -- 0.96 0.92  HEPARINUNFRC 0.17* 0.23* 0.37 -- -- --  CREATININE -- 0.91 -- -- 0.91 0.94  CKTOTAL -- -- -- -- -- --  CKMB -- -- -- -- -- --  TROPONINI -- <0.30 -- <0.30 <0.30 --    Estimated Creatinine Clearance: 52 ml/min (by C-G formula based on Cr of 0.91).     Assessment: 61 yo female with a history of CAD who presents with intermittent CP over the last 2 weeks. Cath in Jan 2013 showed moderate left circumflex stenosis with patent stents. Heparin level (0.17) is now below-goal on 850 units/hr. No problem with line / infusion per RN.   Goal of Therapy:  Heparin level 0.3-0.7 units/ml Monitor platelets by anticoagulation protocol: Yes   Plan:  1. Heparin IV bolus of 2000units, then increase IV heparin to 1050 units/hr.  2. Heparin level in 6 hours. 3. Follow up stress test results   Anette Guarneri, PharmD 11/21/2011 2:43 PM

## 2011-11-21 NOTE — Progress Notes (Signed)
ANTICOAGULATION CONSULT NOTE - Follow Up Consult  Pharmacy Consult for Heparin Indication: chest pain/ACS  No Known Allergies  Patient Measurements: Height: 59 in Weight: 62.9 kg Heparin Dosing Weight: 58.7 kg  Vital Signs: Temp: 98.4 F (36.9 C) (10/16 0508) Temp src: Oral (10/16 0508) BP: 135/71 mmHg (10/16 0508) Pulse Rate: 74  (10/16 0508)  Labs:  Basename 11/21/11 0515 11/20/11 2204 11/20/11 2129 11/20/11 1545 11/20/11 1315  HGB 9.6* -- -- 10.5* --  HCT 32.6* -- -- 35.3* 35.5*  PLT 370 -- -- 405* 399  APTT -- -- -- 33 --  LABPROT -- -- -- 12.7 12.3  INR -- -- -- 0.96 0.92  HEPARINUNFRC 0.23* 0.37 -- -- --  CREATININE -- -- -- 0.91 0.94  CKTOTAL -- -- -- -- --  CKMB -- -- -- -- --  TROPONINI -- -- <0.30 <0.30 <0.30    Estimated Creatinine Clearance: 52 ml/min (by C-G formula based on Cr of 0.91).   Medical History: Past Medical History  Diagnosis Date  . History of colonic polyps   . GERD (gastroesophageal reflux disease)   . Hyperlipidemia   . Diabetes mellitus without mention of complication   . Hypertension   . Myocardial infarction   . Coronary artery disease   . Fatty liver disease, nonalcoholic   . Cholelithiasis   . Iron deficiency anemia   . Angina   . Shortness of breath   . Asthma   . Arthritis     HANDS"    Medications:  Prescriptions prior to admission  Medication Sig Dispense Refill  . albuterol (PROVENTIL HFA;VENTOLIN HFA) 108 (90 BASE) MCG/ACT inhaler Inhale 2 puffs into the lungs every 6 (six) hours as needed. For shortness of breath      . amLODipine (NORVASC) 5 MG tablet Take 5 mg by mouth daily.        Marland Kitchen aspirin EC 81 MG tablet Take 81 mg by mouth daily.      Marland Kitchen atorvastatin (LIPITOR) 20 MG tablet Take 20 mg by mouth daily.      . budesonide-formoterol (SYMBICORT) 160-4.5 MCG/ACT inhaler Inhale 2 puffs into the lungs 2 (two) times daily.      . insulin glargine (LANTUS) 100 UNIT/ML injection Inject 40 Units into the skin at  bedtime.      . metFORMIN (GLUCOPHAGE) 500 MG tablet Take 500 mg by mouth 2 (two) times daily with a meal.      . metoprolol succinate (TOPROL-XL) 50 MG 24 hr tablet Take 50 mg by mouth daily. Take with or immediately following a meal.      . nitroGLYCERIN (NITROSTAT) 0.4 MG SL tablet Place 0.4 mg under the tongue every 5 (five) minutes as needed. As needed      . nitroGLYCERIN (NITROSTAT) 0.4 MG SL tablet Place 0.4 mg under the tongue every 5 (five) minutes as needed. For chest pain      . omeprazole (PRILOSEC) 40 MG capsule Take 40 mg by mouth daily.        Assessment: 61 yo female with a history of CAD who presents with intermittent CP over the last 2 weeks. Cath in Jan 2013 showed moderate left circumflex stenosis with patent stents. Heparin level (0.23) is now below-goal on 700 units/hr. No problem with line / infusion per RN.   Goal of Therapy:  Heparin level 0.3-0.7 units/ml Monitor platelets by anticoagulation protocol: Yes   Plan:  1. Heparin IV bolus of 1000 units, then increase IV heparin to  850 units/hr.  2. Heparin level in 6 hours.   Raye Sorrow, PharmD 11/21/2011 6:00 AM

## 2011-11-22 NOTE — Discharge Summary (Signed)
NAMEELLISON, BARO NO.:  [id number]  MEDICAL RECORD NO.:  [id number]  LOCATION:  [id number]                        FACILITY:  [facility]  PHYSICIAN:  Renee Erb N. Terrence Dupont, M.D. DATE OF BIRTH:  August 31, 1950  DATE OF ADMISSION:  11/20/2011 DATE OF DISCHARGE:  11/21/2011                              DISCHARGE SUMMARY   ADMITTING DIAGNOSES: 1. Recurrent chest pain, rule out progression of coronary artery     disease, rule out myocardial infarction. 2. Coronary artery disease.  History of percutaneous transluminal     coronary angioplasty stenting to right coronary artery and left     circumflex in the past. 3. Insulin-requiring diabetes mellitus. 4. Hypertension. 5. Hypercholesteremia. 6. Gastroesophageal reflux disease. 7. History of tobacco abuse. 8. Chronic obstructive pulmonary disease. 9. History of bronchial asthma. 10.History of colonic polyps. 11.Chronic anemia.  DISCHARGE DIAGNOSES: 1. Stable angina, myocardial infarction ruled out, negative nuclear     stress test. 2. Coronary artery disease, history of percutaneous coronary     intervention to right coronary artery and left circumflex in the     past. 3. Insulin-requiring diabetes mellitus. 4. Hypertension. 5. Hypercholesteremia. 6. Gastroesophageal reflux disease. 7. History of tobacco abuse. 8. History of bronchial asthma. 9. History of colonic polyp. 10.Chronic anemia.  DISCHARGE HOME MEDICATIONS: 1. Enteric-coated aspirin 81 mg 1 tablet daily. 2. Amlodipine 5 mg 1 tablet daily. 3. Atorvastatin 20 mg 1 tablet daily. 4. Symbicort 2 puffs twice daily. 5. Lantus insulin 40 units at bedtime as before. 6. Metformin 500 mg twice daily. 7. Toprol-XL 50 mg by mouth daily. 8. Nitrostat 0.4 mg sublingual use as directed. 9. Omeprazole 40 mg daily. 10.Albuterol 2 puffs every 6 hours as needed.  DIET:  Low salt, low cholesterol 1800 calories ADA diet.  The patient has been advised to monitor blood sugar  and blood pressure daily.  CONDITION AT DISCHARGE:  Stable.  FOLLOWUP:  Follow up with me in 1 week.  Follow up with GI as scheduled.  BRIEF HISTORY AND HOSPITAL COURSE:  Ms. Alva is a 61 year old female with past medical history significant for multiple medical problems, i.e., coronary artery disease status post PTCA stenting to left circumflex in 2008 and PTCA stenting to RCA in 2009, hypertension, insulin-requiring diabetes mellitus, hypercholesteremia, GERD, history of tobacco abuse, COPD, history of bronchial asthma, history of colonic polyp, chronic anemia.  She came to the urgent care center complaining of recurrent retrosternal and left-sided chest pain radiating to left shoulder and left arm off and on for last 2 weeks associated with nausea, diaphoresis, and mild shortness of breath.  States chest pain lasted 15-20 minutes.  Her pain is grade 4/10 and described as pushing and pressure.  The patient states initially she thought it was due to acid reflux, took some antacids without relief, and as chest pain got progressively worse, so decided to go to urgent care.  The patient was subsequently transferred to [facility] ER.  The patient denies any relation of chest pain to food, breathing, or movement.  Denies any cough, fever, or chills.  The patient had left cath done in January 2013, which showed moderate left circumflex stenosis prior to stents,  stents were noted to be patent.  PAST MEDICAL HISTORY:  As above.  PAST SURGICAL HISTORY:  She had right colectomy and tubal ligation.  Had coronary angioplasty and stenting as above in the past.  PHYSICAL EXAMINATION:  VITAL SIGNS:  Her blood pressure was 129/81, pulse was 69.  She was afebrile. HEENT:  Conjunctivae was pink. NECK:  Supple.  No JVD.  No bruit. LUNGS:  Clear to auscultation without rhonchi or rales. CARDIOVASCULAR:  S1 and S2 was normal.  There was soft systolic murmur. No S3 gallop. ABDOMEN:  Soft.  Bowel  sounds were present.  Nontender. EXTREMITIES:  No clubbing, cyanosis, or edema. NEUROLOGIC:  Grossly intact.  LABORATORY DATA:  Her 3 sets of troponin I were negative.  Sodium was 139, potassium 3.9, BUN 11, creatinine 0.94.  Her blood sugar was 69. Cholesterol total was 91, triglycerides 136, HDL was low at 28, LDL was 36.  Her vitamin B12 was slightly low at 197.  Hemoglobin was 11.3, hematocrit 36.8, white count of 8.0.  Chest x-ray showed no acute cardiopulmonary findings.  EKG showed normal sinus rhythm with nonspecific T-wave changes.  Lexiscan Myoview scan showed no evidence of ischemia, decreased activity in the lateral wall on both sides on stress images suggestive of attenuation versus old infarct with EF of 62%.  BRIEF HOSPITAL COURSE:  The patient was admitted to telemetry unit.  MI was ruled out by serial enzymes and EKG.  The patient subsequently underwent Lexiscan Myoview stress test, which showed no evidence of ischemia as above.  The patient did not have any further episodes of chest pain during the hospital stay.  The patient has been ambulating in room without any problems.  The patient will be discharged home and will be followed up by GI as an outpatient and will have further workup for chronic anemia as an outpatient.     Allegra Lai. Terrence Dupont, M.D.     MNH/MEDQ  D:  11/21/2011  T:  11/22/2011  Job:  [id number]

## 2011-11-30 ENCOUNTER — Encounter (HOSPITAL_COMMUNITY)
Admission: RE | Disposition: A | Discharge status: Home / Self Care | Payer: Self-pay | Source: Ambulatory Visit | Attending: Internal Medicine

## 2011-11-30 ENCOUNTER — Encounter (HOSPITAL_COMMUNITY): Payer: Self-pay | Admitting: Gastroenterology

## 2011-11-30 ENCOUNTER — Ambulatory Visit (HOSPITAL_COMMUNITY)
Admission: RE | Admit: 2011-11-30 | Discharge: 2011-11-30 | Disposition: A | Discharge status: Home / Self Care | Payer: Medicaid Other | Source: Ambulatory Visit | Attending: Internal Medicine | Admitting: Internal Medicine

## 2011-11-30 DIAGNOSIS — J4489 Other specified chronic obstructive pulmonary disease: Secondary | ICD-10-CM | POA: Insufficient documentation

## 2011-11-30 DIAGNOSIS — E785 Hyperlipidemia, unspecified: Secondary | ICD-10-CM | POA: Insufficient documentation

## 2011-11-30 DIAGNOSIS — K219 Gastro-esophageal reflux disease without esophagitis: Secondary | ICD-10-CM | POA: Insufficient documentation

## 2011-11-30 DIAGNOSIS — D649 Anemia, unspecified: Secondary | ICD-10-CM

## 2011-11-30 DIAGNOSIS — I252 Old myocardial infarction: Secondary | ICD-10-CM | POA: Insufficient documentation

## 2011-11-30 DIAGNOSIS — R195 Other fecal abnormalities: Secondary | ICD-10-CM

## 2011-11-30 DIAGNOSIS — D126 Benign neoplasm of colon, unspecified: Secondary | ICD-10-CM

## 2011-11-30 DIAGNOSIS — Z794 Long term (current) use of insulin: Secondary | ICD-10-CM | POA: Insufficient documentation

## 2011-11-30 DIAGNOSIS — J449 Chronic obstructive pulmonary disease, unspecified: Secondary | ICD-10-CM | POA: Insufficient documentation

## 2011-11-30 DIAGNOSIS — D509 Iron deficiency anemia, unspecified: Secondary | ICD-10-CM

## 2011-11-30 DIAGNOSIS — R110 Nausea: Secondary | ICD-10-CM | POA: Insufficient documentation

## 2011-11-30 DIAGNOSIS — E119 Type 2 diabetes mellitus without complications: Secondary | ICD-10-CM | POA: Insufficient documentation

## 2011-11-30 DIAGNOSIS — R0789 Other chest pain: Secondary | ICD-10-CM | POA: Insufficient documentation

## 2011-11-30 HISTORY — DX: Benign neoplasm of colon, unspecified: D12.6

## 2011-11-30 HISTORY — PX: ESOPHAGOGASTRODUODENOSCOPY: SHX5428

## 2011-11-30 HISTORY — PX: COLONOSCOPY: SHX5424

## 2011-11-30 SURGERY — EGD (ESOPHAGOGASTRODUODENOSCOPY)
Anesthesia: Moderate Sedation

## 2011-11-30 MED ORDER — ALBUTEROL SULFATE (5 MG/ML) 0.5% IN NEBU: 2.5000 mg | INHALATION_SOLUTION | Freq: Once | RESPIRATORY_TRACT | Status: DC

## 2011-11-30 MED ORDER — ALBUTEROL SULFATE (5 MG/ML) 0.5% IN NEBU
INHALATION_SOLUTION | RESPIRATORY_TRACT | Status: AC
  Filled 2011-11-30: qty 0.5

## 2011-11-30 MED ORDER — FENTANYL CITRATE 0.05 MG/ML IJ SOLN
INTRAMUSCULAR | Status: AC
  Filled 2011-11-30: qty 2

## 2011-11-30 MED ORDER — MIDAZOLAM HCL 10 MG/2ML IJ SOLN
INTRAMUSCULAR | Status: DC | PRN
  Administered 2011-11-30 (×3): 2 mg via INTRAVENOUS
  Administered 2011-11-30: 1 mg via INTRAVENOUS
  Administered 2011-11-30: 2 mg via INTRAVENOUS
  Administered 2011-11-30: 1 mg via INTRAVENOUS

## 2011-11-30 MED ORDER — BUTAMBEN-TETRACAINE-BENZOCAINE 2-2-14 % EX AERO
INHALATION_SPRAY | CUTANEOUS | Status: DC | PRN
  Administered 2011-11-30: 2 via TOPICAL

## 2011-11-30 MED ORDER — MIDAZOLAM HCL 10 MG/2ML IJ SOLN
INTRAMUSCULAR | Status: AC
  Filled 2011-11-30: qty 2

## 2011-11-30 MED ORDER — FENTANYL CITRATE 0.05 MG/ML IJ SOLN
INTRAMUSCULAR | Status: DC | PRN
  Administered 2011-11-30 (×3): 10 ug via INTRAVENOUS
  Administered 2011-11-30: 20 ug via INTRAVENOUS
  Administered 2011-11-30: 10 ug via INTRAVENOUS
  Administered 2011-11-30 (×2): 25 ug via INTRAVENOUS
  Administered 2011-11-30: 15 ug via INTRAVENOUS

## 2011-11-30 MED ORDER — SODIUM CHLORIDE 0.9 % IV SOLN: INTRAVENOUS | Status: DC

## 2011-11-30 NOTE — H&P (View-Only) (Signed)
[name] is an 61 y.o. female.   Chief Complaint: Recurrent chest pain HPI: Patient is 61 year old female with past medical history significant for multiple medical problems i.e. coronary artery disease, status post PTCA stenting to left circumflex in 2008 and PTCA stenting to RCA in 2009, hypertension, insulin requiring diabetes mellitus, hypercholesteremia, GERD, history of tobacco abuse, COPD, history of bronchial asthma, history of colonic polyp, chronic anemia, she came to urgent care Center complaining of recurrent retrosternal and left-sided chest pain radiating to left shoulder and left arm off and on for last 2 weeks associated with nausea diaphoresis and mild shortness of breath states chest pain lasts about 15-20 minutes her pain was grade 4/10 and described as pushing and pressure. Patient states initially she thought it is due to acid reflux took some antacids without relief and has chest pain got progressive worse so decided to come to urgent care. Patient denies any relation of chest pain to food breathing or movement denies any cough fever chills. Patient had left cath done in January of 2013 which showed moderate left circumflex stenosis with patent stents.  Past Medical History  Diagnosis Date  . History of colonic polyps   . GERD (gastroesophageal reflux disease)   . Hyperlipidemia   . Diabetes mellitus without mention of complication   . Hypertension   . Myocardial infarction   . Coronary artery disease   . Fatty liver disease, nonalcoholic   . Cholelithiasis   . Iron deficiency anemia   . Angina   . Shortness of breath   . Asthma     Past Surgical History  Procedure Date  . Right colectomy     After colonic perforation  . Tubal ligation   . Coronary angioplasty with stent placement     Family History  Problem Relation Age of Onset  . Heart disease Mother   . Diabetes Mother   . Colon cancer Neg Hx   . Cancer Father     unsure what kind  . Diabetes Sister     Social History:  reports that she quit smoking about 18 months ago. Her smoking use included Cigarettes. She has a 23.5 pack-year smoking history. She quit smokeless tobacco use about 20 months ago. She reports that she does not drink alcohol or use illicit drugs.  Allergies: No Known Allergies   (Not in a hospital admission)  No results found for this or any previous visit (from the past 48 hour(s)). No results found.  Review of Systems  Constitutional: Negative for fever and chills.  HENT: Negative for hearing loss.   Eyes: Negative for blurred vision and double vision.  Respiratory: Negative for cough, hemoptysis and sputum production.   Cardiovascular: Positive for chest pain. Negative for palpitations, orthopnea, claudication and leg swelling.  Gastrointestinal: Positive for nausea. Negative for vomiting, abdominal pain and diarrhea.  Skin: Negative for rash.  Neurological: Negative for dizziness and headaches.    Blood pressure 129/81, pulse 69, temperature 98.2 F (36.8 C), temperature source Oral, resp. rate 24, SpO2 100.00%. Physical Exam  Constitutional: She is oriented to person, place, and time. She appears well-developed.  HENT:  Head: Normocephalic and atraumatic.  Eyes: Conjunctivae normal are normal. Pupils are equal, round, and reactive to light. Left eye exhibits no discharge. No scleral icterus.  Neck: Neck supple. No JVD present. No tracheal deviation present. No thyromegaly present.  Cardiovascular: Normal rate and regular rhythm.   Murmur (Soft systolic murmur noted no S3 gallop) heard. Respiratory:  Effort normal and breath sounds normal. No respiratory distress. She has no wheezes.  GI: Soft. Bowel sounds are normal. She exhibits no distension. There is no tenderness. There is no rebound.  Musculoskeletal: She exhibits no edema and no tenderness.  Neurological: She is alert and oriented to person, place, and time.     Assessment/Plan Recurrent chest  pain rule out progression of coronary artery disease/rule out MI Coronary artery disease history of PCI to RCA and left circumflex in the past  Insulin requiring diabetes mellitus Hypertension Hypercholesteremia GERD History of tobacco abuse COPD History of bronchial asthma History of colonic polyp Chronic anemia Plan As per orders Analeese Andreatta N 11/20/2011, 12:37 PM

## 2011-11-30 NOTE — Op Note (Signed)
[facility] [place], [address]   COLONOSCOPY PROCEDURE REPORT  PATIENT: [name], [name]  MR#: [id number] BIRTHDATE: [birth year] , 61  yrs. old GENDER: Female ENDOSCOPIST: Lafayette Dragon, MD REFERRED BY:  Lucianne Lei, M.D. PROCEDURE DATE:  11/30/2011 PROCEDURE:   Colonoscopy with snare polypectomy and Colonoscopy with cold biopsy polypectomy ASA CLASS:   Class III INDICATIONS:patient's personal history of adenomatous colon polyps and prior right hemicolectomy for perforation duering polypectomy in 2009,, hyperplastic polyp 2011. MEDICATIONS: These medications were titrated to patient response per physician's verbal order, Versed 5 mg IV, and Fentanyl-Detailed 65 mcg IV  DESCRIPTION OF PROCEDURE:   After the risks and benefits and of the procedure were explained, informed consent was obtained.  A digital rectal exam revealed decreased sphincter tone.    The Pentax Ped Colon [id number]  endoscope was introduced through the anus and advanced to the surgical anastomosis .  The quality of the prep was good, using MoviPrep .  The instrument was then slowly withdrawn as the colon was fully examined.     COLON FINDINGS: Two smooth sessile polyps ranging between 3-50mm in size were found in the sigmoid colon.  A polypectomy was performed using snare cautery.  The resection was complete and the polyp tissue was completely retrieved.   There was evidence of a prior ileocolonic surgical anastomosis. which was widely patent Retroflexed views revealed no abnormalities.     The scope was then withdrawn from the patient and the procedure completed.  COMPLICATIONS: There were no complications. ENDOSCOPIC IMPRESSION: 1.   Two sessile polyps ranging between 3-82mm in size were found in the sigmoid colon; polypectomy was performed using snare cautery 2.   There was evidence of a prior ileocolonic surgical anastomosis 3. decreased rectal sphincter  tone  RECOMMENDATIONS: Await pathology results high fiber diet   REPEAT EXAM: In 10 year(s)  for Colonoscopy.  cc:  _______________________________ eSignedLafayette Dragon, MD 11/30/2011 9:57 AM

## 2011-11-30 NOTE — Interval H&P Note (Signed)
History and Physical Interval Note:  11/30/2011 8:21 AM  [name]  has presented today for surgery, with the diagnosis of anemia  The various methods of treatment have been discussed with the patient and family. After consideration of risks, benefits and other options for treatment, the patient has consented to  Procedure(s) (LRB) with comments: ESOPHAGOGASTRODUODENOSCOPY (EGD) (N/A) COLONOSCOPY (N/A) as a surgical intervention .  The patient's history has been reviewed, patient examined, no change in status, stable for surgery.  I have reviewed the patient's chart and labs.  Questions were answered to the patient's satisfaction.     Delfin Edis

## 2011-11-30 NOTE — Op Note (Signed)
[facility] [place], [address]   ENDOSCOPY PROCEDURE REPORT  PATIENT: [name], [name]  MR#: [id number] BIRTHDATE: December 13, 1950 , 61  yrs. old GENDER: Female ENDOSCOPIST: Lafayette Dragon, MD REFERRED BY:  Lucianne Lei, M.D. PROCEDURE DATE:  11/30/2011 PROCEDURE:  EGD, diagnostic ASA CLASS:     Class III INDICATIONS:  heme positive stool.  ,iron deficiency anemia, MEDICATIONS: These medications were titrated to patient response per physician's verbal order, Versed 5 mg IV, Fentanyl-Quick Pick, and Fentanyl-Detailed 60 mcg IV TOPICAL ANESTHETIC: Cetacaine Spray  DESCRIPTION OF PROCEDURE: After the risks benefits and alternatives of the procedure were thoroughly explained, informed consent was obtained.  The Pentax Gastroscope [id number] endoscope was introduced through the mouth and advanced to the second portion of the duodenum. Without limitations.  The instrument was slowly withdrawn as the mucosa was fully examined.    The upper, middle and distal third of the esophagus were carefully inspected and no abnormalities were noted.  The z-line was well seen at the GEJ.  The endoscope was pushed into the fundus which was normal including a retroflexed view.  The antrum, gastric body, first and second part of the duodenum were unremarkable. Retroflexed views revealed no abnormalities.     The scope was then withdrawn from the patient and the procedure completed.  COMPLICATIONS: There were no complications. ENDOSCOPIC IMPRESSION: Normal EGD , nothing to account for heme positive stool  RECOMMENDATIONS: Proceed with colonoscopy  REPEAT EXAM: no recall  eSigned:  Lafayette Dragon, MD 11/30/2011 9:48 AM   CC:

## 2011-12-03 ENCOUNTER — Encounter (HOSPITAL_COMMUNITY): Payer: Self-pay

## 2011-12-03 ENCOUNTER — Encounter: Payer: Self-pay | Admitting: Internal Medicine

## 2011-12-03 ENCOUNTER — Telehealth: Payer: Self-pay | Admitting: Internal Medicine

## 2011-12-03 ENCOUNTER — Encounter (HOSPITAL_COMMUNITY): Payer: Self-pay | Admitting: Internal Medicine

## 2011-12-03 NOTE — Telephone Encounter (Signed)
Advised patient that I do not think we would have prescribed albuterol for her. She may need to call her PCP.

## 2011-12-06 ENCOUNTER — Telehealth: Payer: Self-pay | Admitting: Internal Medicine

## 2011-12-06 MED ORDER — PANTOPRAZOLE SODIUM 40 MG PO TBEC: DELAYED_RELEASE_TABLET | ORAL | Status: DC

## 2011-12-06 NOTE — Telephone Encounter (Signed)
Difficult symptom to tease out. Recent EGD unremarkable.  I reviewed Dr. Nichola Sizer last note and she has had imaging in the past and HIDA scan normal in 2012. Not sure if this is reflux.  Could try another PPI to see if this helps. Would d/c omeprazole and replace with pantoprazole 40 mg daily, 30 min before 1st meal of the day.  Can use pepcid for breakthrough symptoms. Could be esophageal spasm, but she can followup with Dr. Olevia Perches in this regard if not better with change in PPI.

## 2011-12-06 NOTE — Telephone Encounter (Signed)
Patient given Dr. Vena Rua recommendations. Rx sent to pharmacy. She will call back if symptoms do not get better.

## 2011-12-06 NOTE — Telephone Encounter (Signed)
Patient is still having the pressure in her chest after she eats or drinks. She has been evaluated by her cardiologist and it is not her heart. She had an ECOL on 11/30/11. EGD- normal. Colon- benign polyps. She is taking Prilosec daily. Hx heme + stools, anemia, GERD, fatty liver. Please,advise.DOD-Pyrtle

## 2011-12-11 ENCOUNTER — Ambulatory Visit (INDEPENDENT_AMBULATORY_CARE_PROVIDER_SITE_OTHER): Payer: Medicaid Other | Admitting: Internal Medicine

## 2011-12-11 DIAGNOSIS — E538 Deficiency of other specified B group vitamins: Secondary | ICD-10-CM

## 2011-12-11 DIAGNOSIS — D649 Anemia, unspecified: Secondary | ICD-10-CM

## 2011-12-27 ENCOUNTER — Telehealth: Payer: Self-pay | Admitting: Internal Medicine

## 2011-12-27 NOTE — Telephone Encounter (Signed)
L/m advising Cecille Rubin, case manager that Dr Hilarie Fredrickson and Dr Olevia Perches are partners. Dr Olevia Perches was out of the office when patient called with symptoms. Therefore, Dr Hilarie Fredrickson advised on patient. Advised that Dr Olevia Perches is okay with patient changing to pantoprazole and that she should come in to the office to see Dr Olevia Perches if any more problems after taking pantoprazole.

## 2012-01-05 IMAGING — CT CT ABD-PELV W/O CM
2 of 4 series · 16 of 46 positions shown, 18 images · non-contrast
Comparison: [HOSPITAL] acute abdominal radiographs
04/26/2009 and [HOSPITAL] abdominal pelvic CT 02/22/2007
and [HOSPITAL] chest x-ray 02/13/2009.

CLINICAL DATA: Abdominal pain with distention.  Colonoscopy
04/25/2009.  Hypertension, diabetes.  Previous bowel resection.

CT ABDOMEN AND PELVIS WITHOUT CONTRAST
TECHNIQUE: Multidetector CT imaging of the abdomen and pelvis was
performed following the standard protocol without intravenous
contrast. Oral contrast only.

[Series 2: rtn a/p w/o · axial · non-contrast · 0.67mm/px · z∈[-598,-213]mm · 13 of 85 slices shown, 15 images]
[im 4/85  soft-tissue]
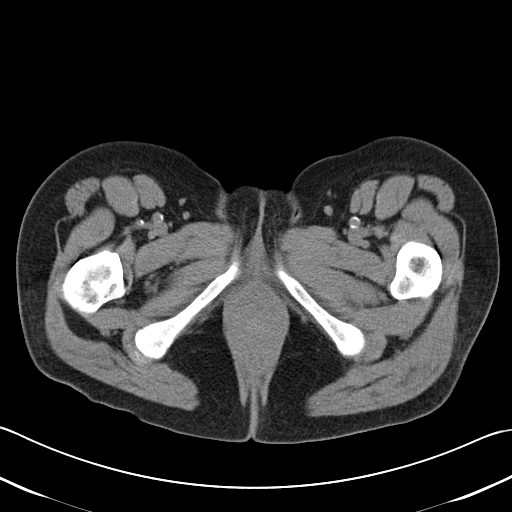
[im 4/85  bone]
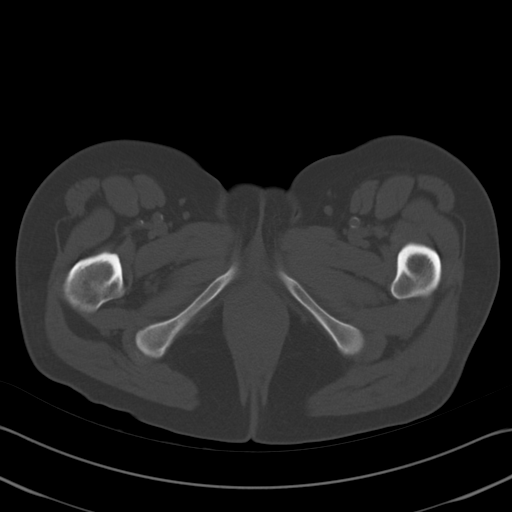
[im 11/85  soft-tissue]
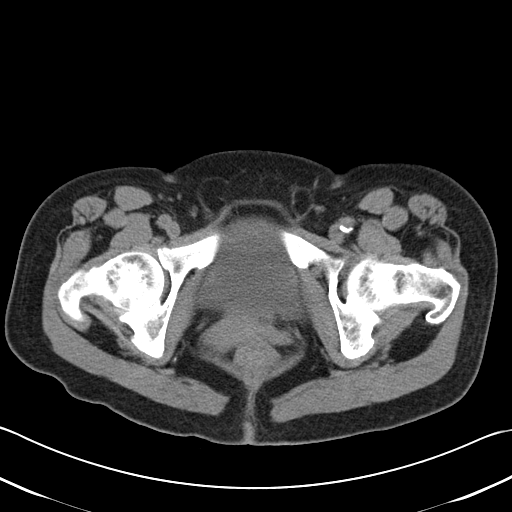
[im 18/85  soft-tissue]
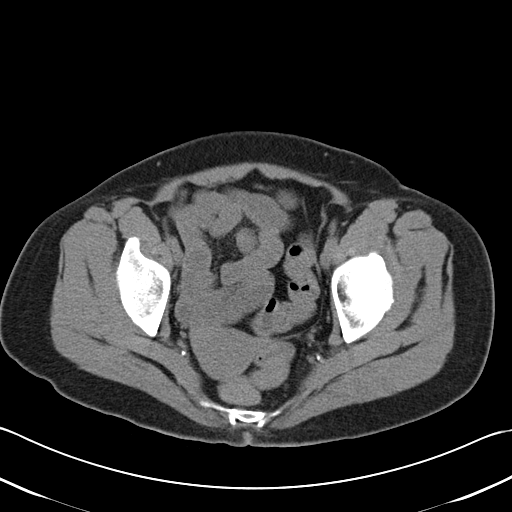
[im 25/85  soft-tissue]
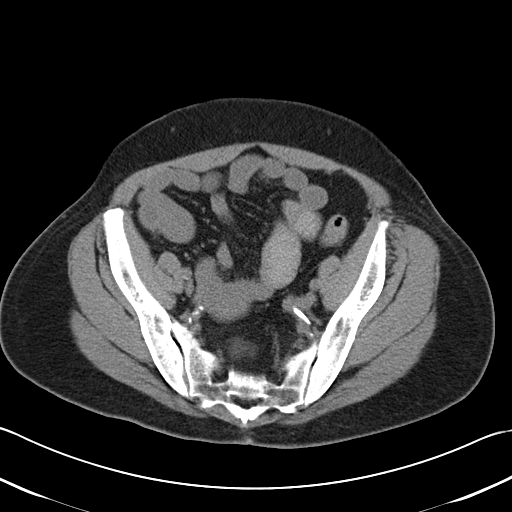
[im 29/85  soft-tissue]
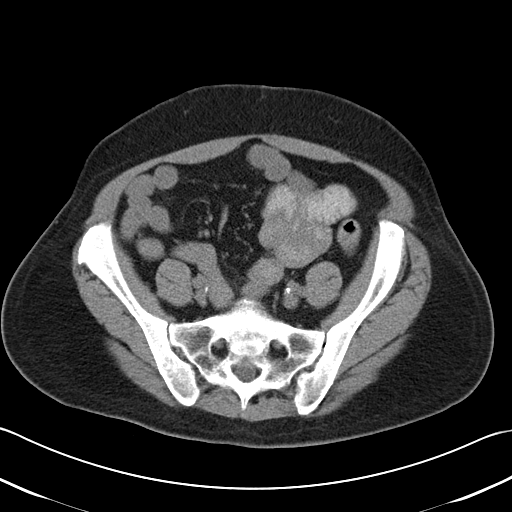
[im 36/85  soft-tissue]
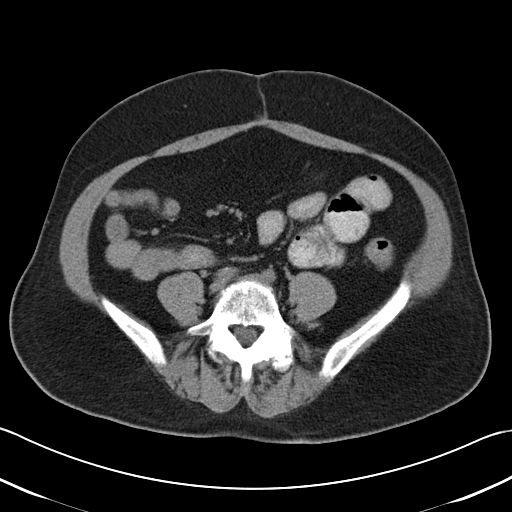
[im 43/85  soft-tissue]
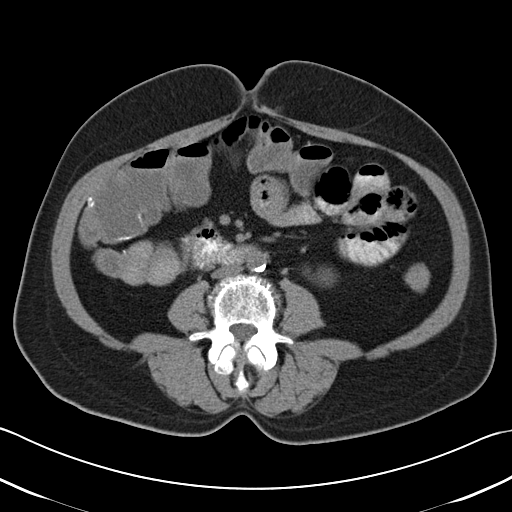
[im 50/85  soft-tissue]
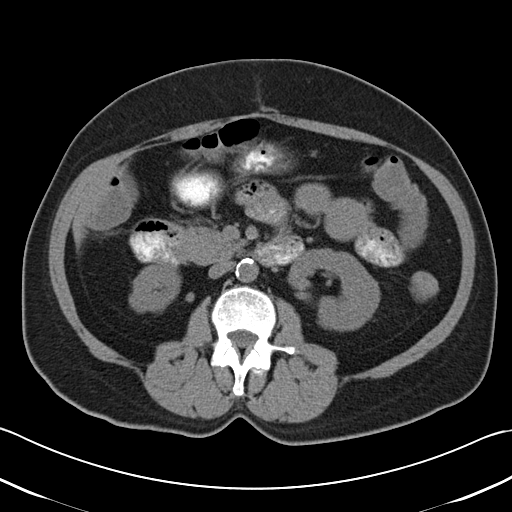
[im 57/85  soft-tissue]
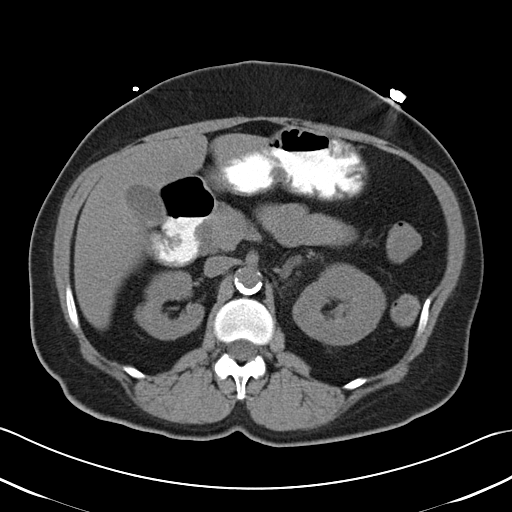
[im 57/85  bone]
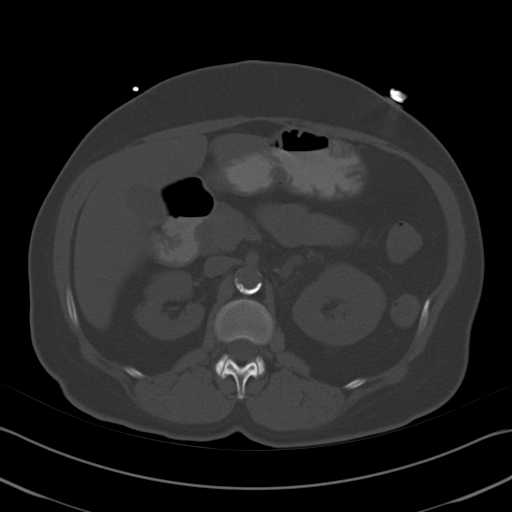
[im 60/85  soft-tissue]
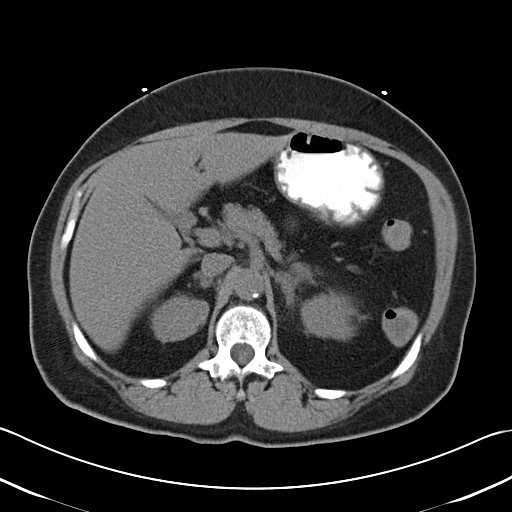
[im 67/85  soft-tissue]
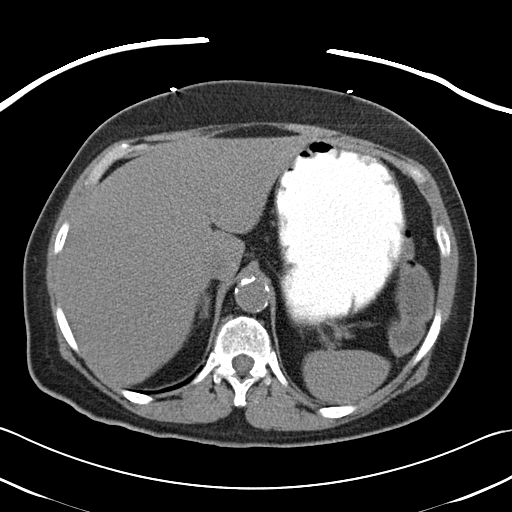
[im 74/85  soft-tissue]
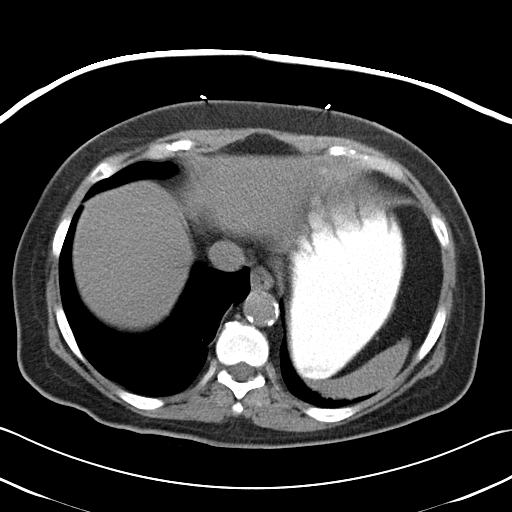
[im 81/85  soft-tissue]
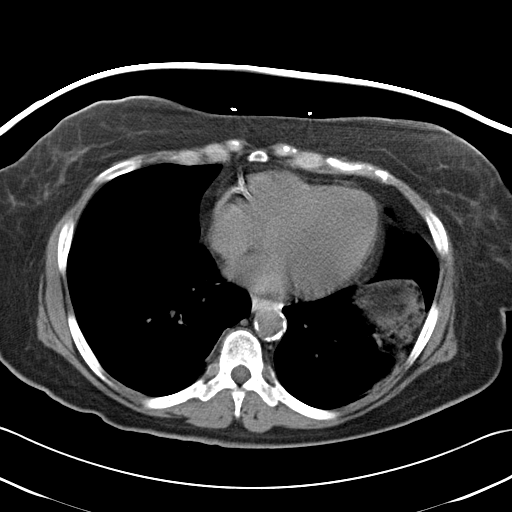

[Series 602: <mpr thick range> · coronal · 0.82mm/px · 3 of 84 slices shown]
[im 28/84  soft-tissue]
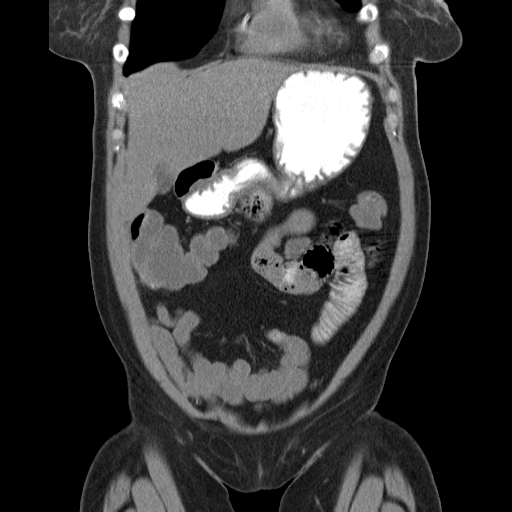
[im 37/84  soft-tissue]
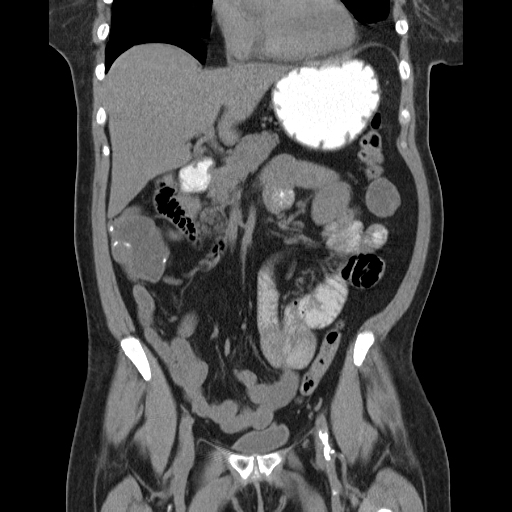
[im 47/84  soft-tissue]
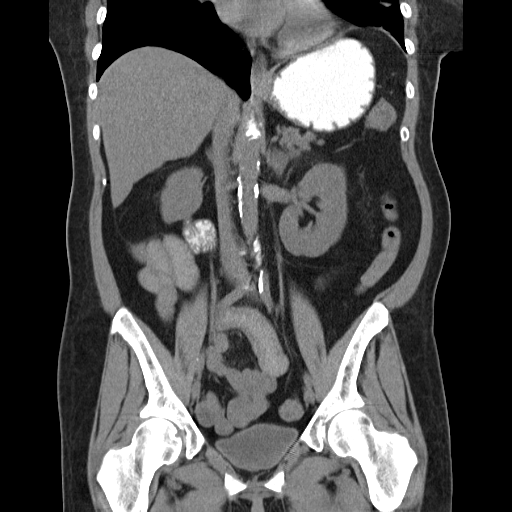

[16 of 46 positions shown; findings below may reference images not displayed]

FINDINGS: Since 02/22/2007 right hemicolectomy suture line with no
interval inflammatory bowel or obstructive bowel findings seen.  No
persistent air fluid levels since abdominal radiographs 04/26/2009
noted.  Slight left basilar atelectasis or scarring is seen with
lung bases otherwise clear.  Moderate atheromatous vascular
calcification with normal caliber abdominal aorta noted.  Stable
slight right renal atrophy without hydronephrosis and slight
diffuse fatty infiltration liver visualized.  Tube and air fluid
level within the vagina resolved.  Remaining abdominal and pelvic
organs appear stable and normal with no interval inflammation, free
fluid or adenopathy.  Stable small bilateral inguinal hernias
contain fat only.  Stable advanced left L5-S1 facet degenerative
joint disease with slight diffuse L4-5 disc bulge noted.
IMPRESSION: 1.  Right hemicolectomy with no current inflammatory bowel
obstructive intestinal findings.  (No persistent nondifferential
air-fluid levels since abdominal radiographs 04/26/2009).
[DATE].  Slight left basilar atelectasis or scarring.
3.  Moderate atheromatous vascular calcification is stable slight
right renal atrophy and slight diffuse fatty infiltration liver.
4.  Interval clearing and removal of catheter vagina with stable
small bilateral inguinal hernias containing fat only.
5.  Stable advanced left L5-S1 facet degenerative joint disease
with slight diffuse L4-5 disc bulge.
6.  Otherwise, negative.

## 2012-01-05 IMAGING — CR DG ABDOMEN ACUTE W/ 1V CHEST
3 series · 3 of 3 positions shown · non-contrast
Comparison: [HOSPITAL] abdominal radiograph 03/10/2007 and
abdominal pelvic CT 02/22/2007 and chest x-ray 10/16/2008.

CLINICAL DATA: 3 days mid chest pain, nausea vomiting.  Clammy.

ACUTE ABDOMEN SERIES (ABDOMEN 2 VIEW & CHEST 1 VIEW)

[w chest pa]
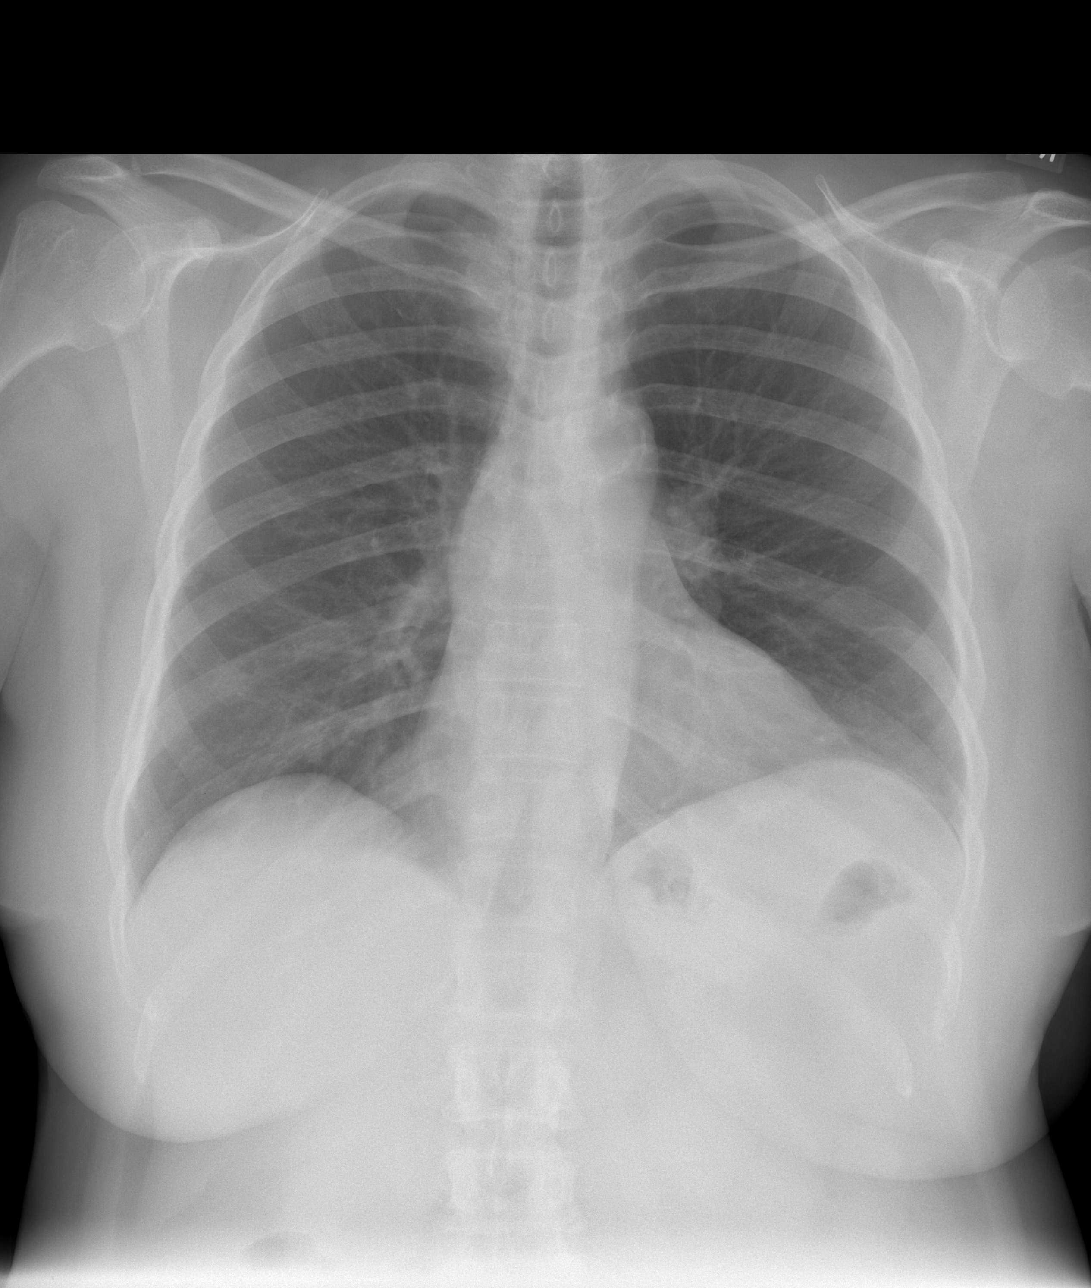

[w abdomen upright *]
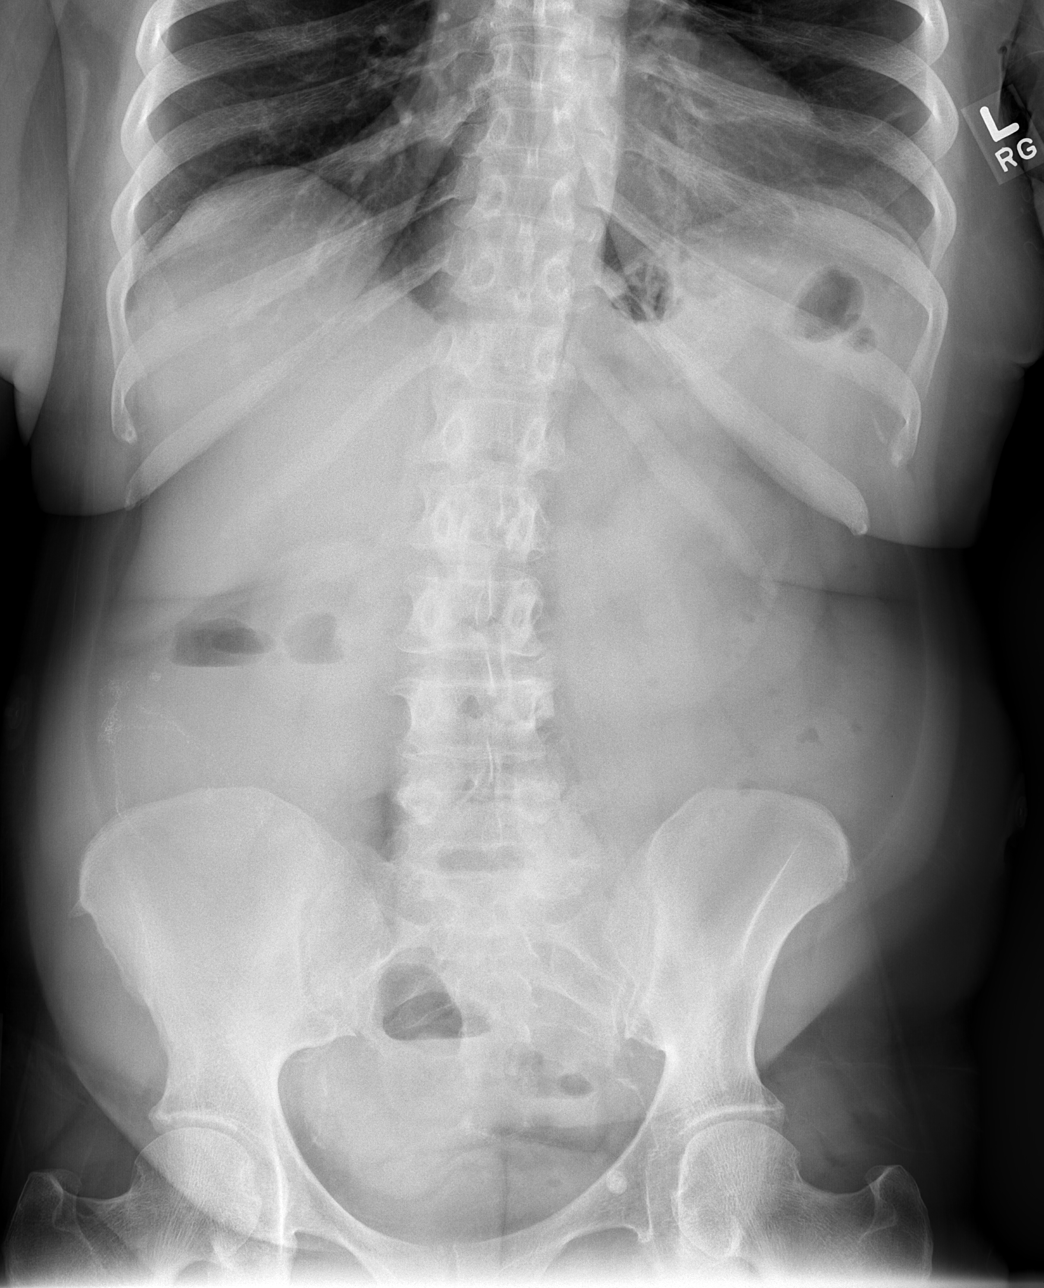

[t abdomen supine]
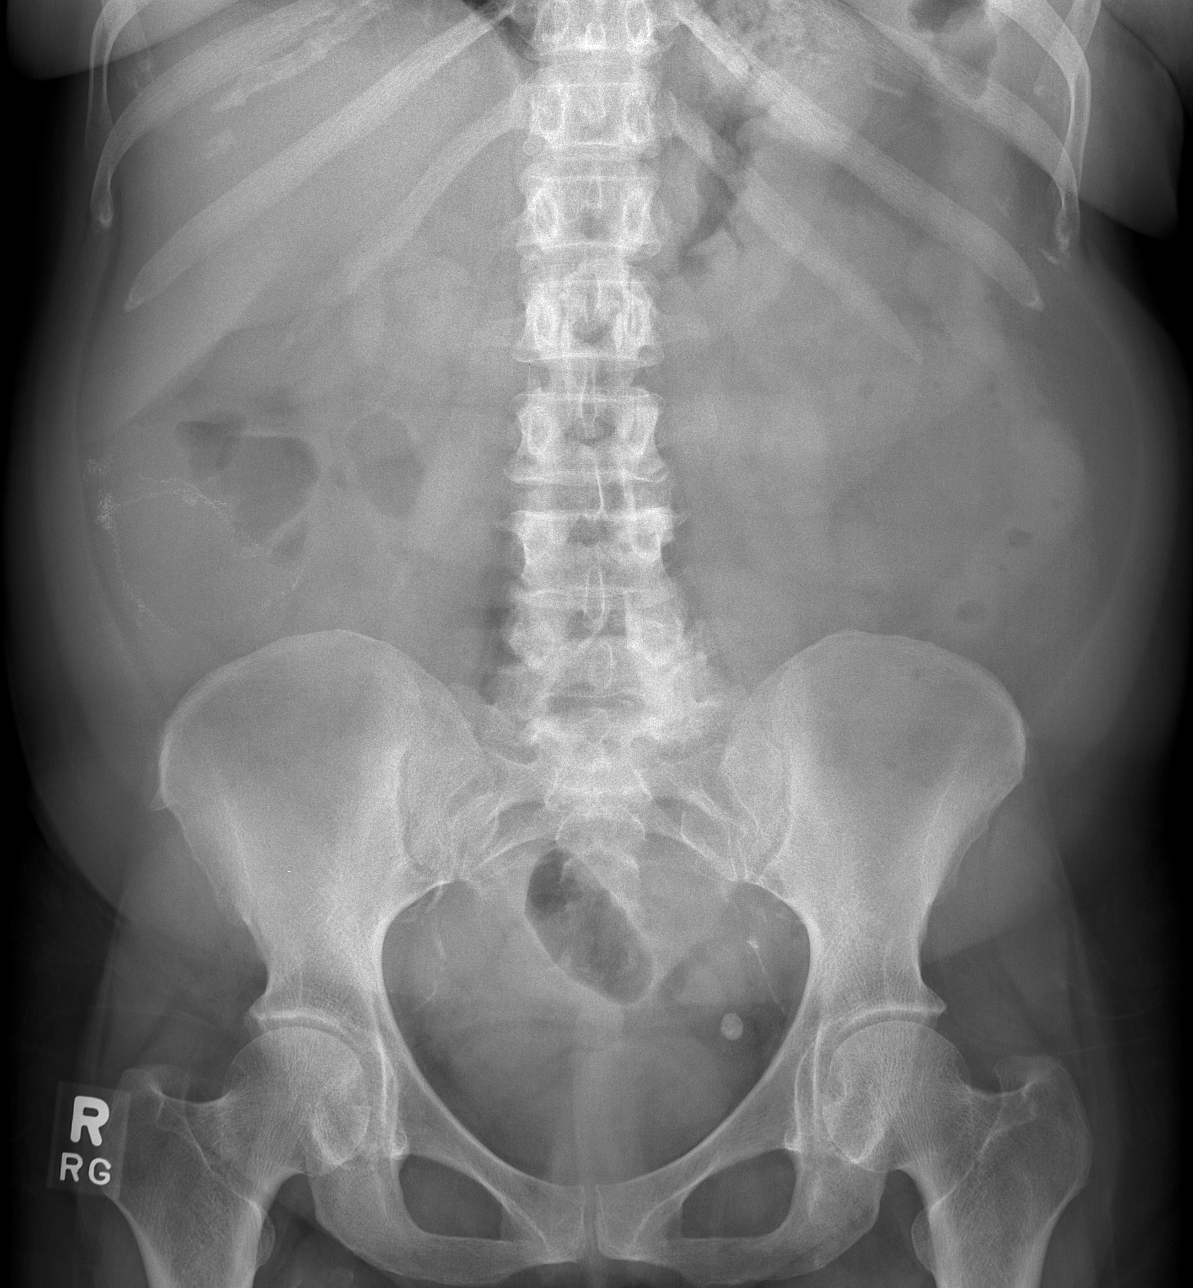

[3 of 3 positions shown; findings below may reference images not displayed]

FINDINGS: No pneumoperitoneum seen with stable right lower quadrant
suture line.  Bowel gas pattern is unremarkable with
nondifferential nondilated air fluid levels at the right
hemiabdomen and inferior pelvis.  The lungs are clear with normal
heart size.  Stable slight scoliosis and degenerative facet changes
maximal left L5-S1 is seen.  No change in pelvic vascular
calcification.
IMPRESSION: 1.  Slight nondifferential nondilated air fluid levels at the right
hemiabdomen and pelvis may represent gastroenteritis or very early
partial small bowel obstruction.
2.  Otherwise no new acute findings.

## 2012-01-07 IMAGING — US US ABDOMEN COMPLETE
1 series · 13 of 25 positions shown · non-contrast
Comparison: CT 04/26/2009

CLINICAL DATA: Abdominal pain

ABDOMINAL ULTRASOUND COMPLETE

[Series 1: us abdomen complete · 0.26mm/px · 13 of 73 slices shown]
[im 1/73]
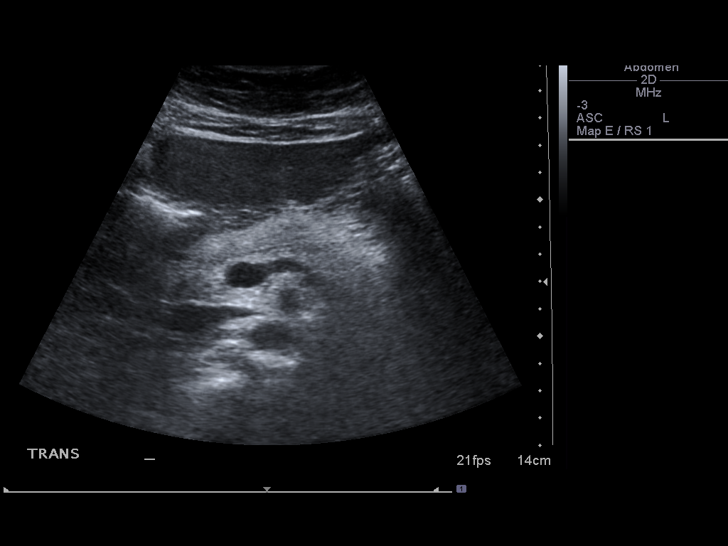
[im 7/73]
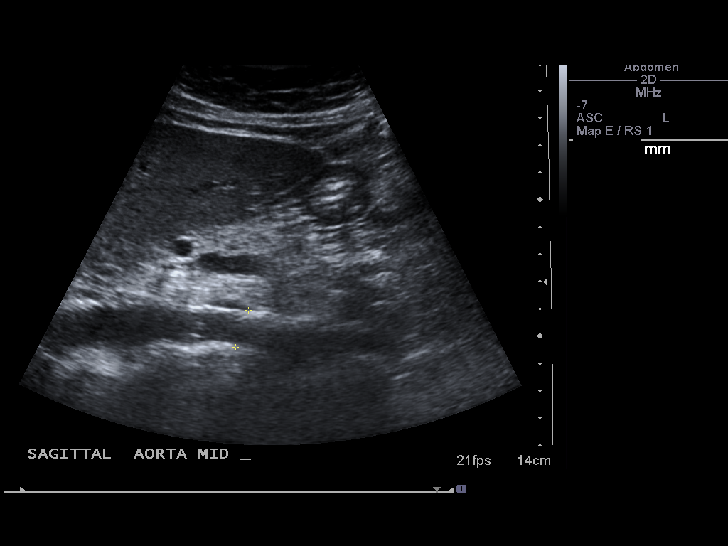
[im 13/73]
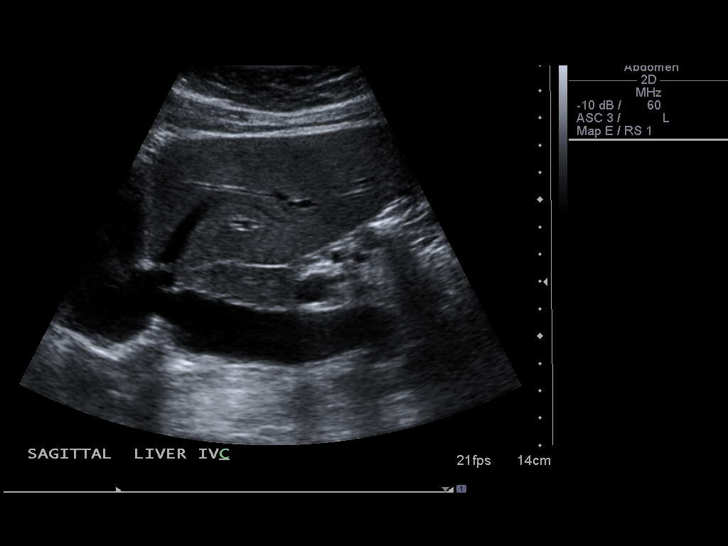
[im 19/73]
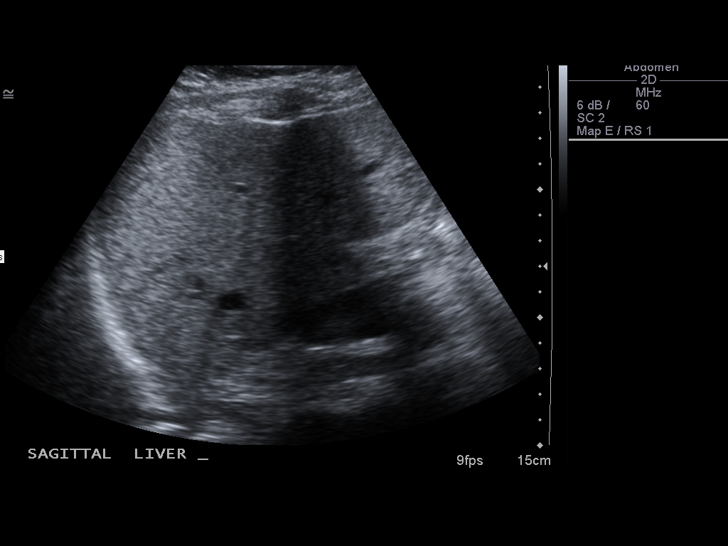
[im 25/73]
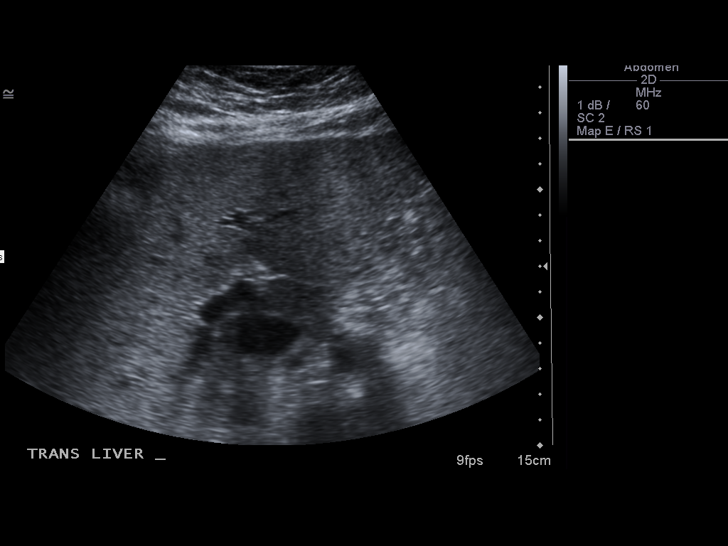
[im 31/73]
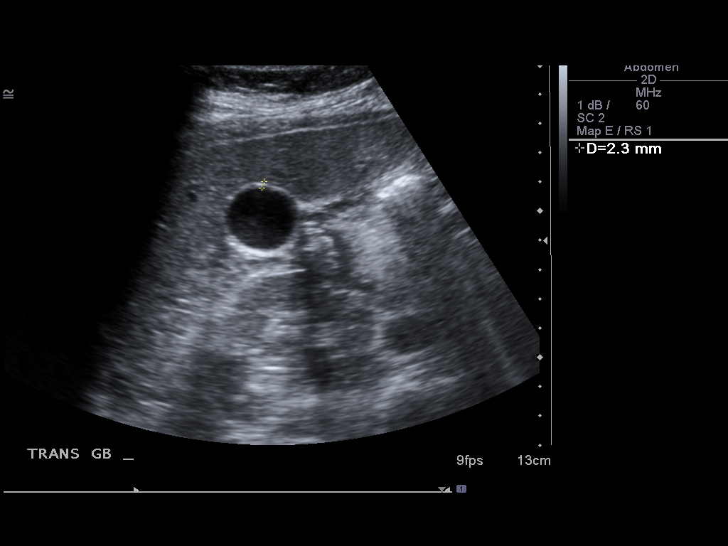
[im 37/73]
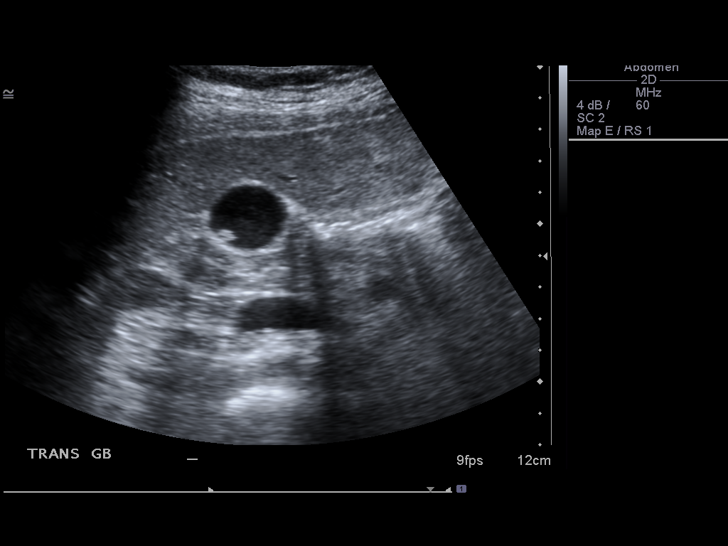
[im 43/73]
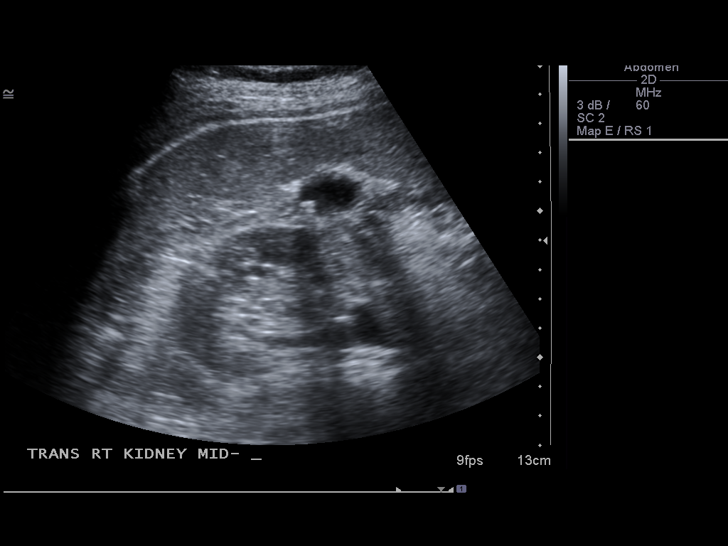
[im 49/73]
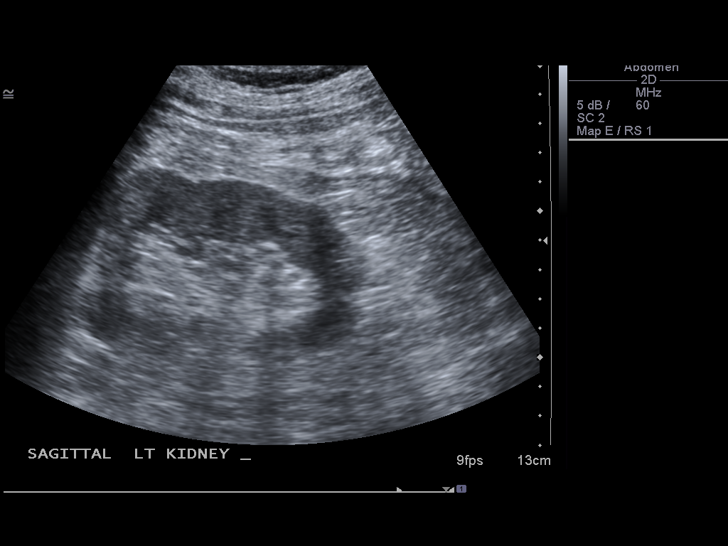
[im 55/73]
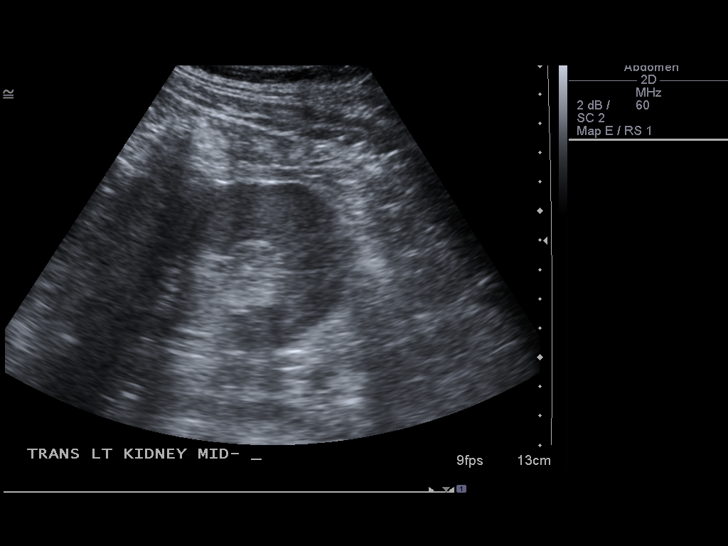
[im 61/73]
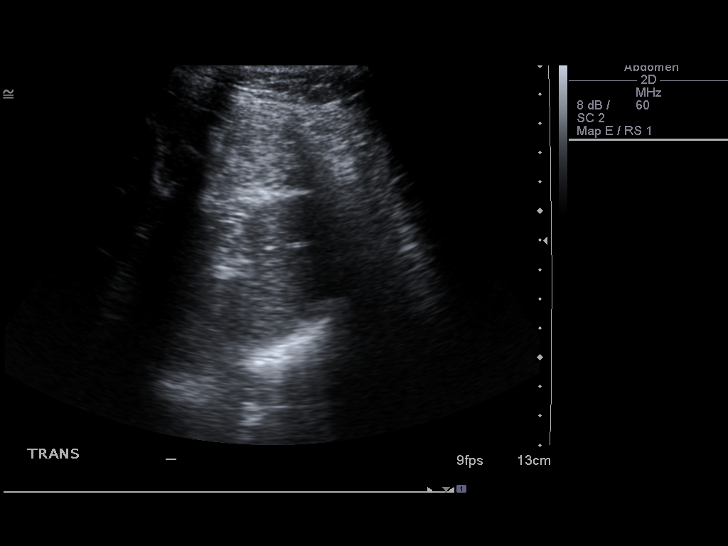
[im 67/73]
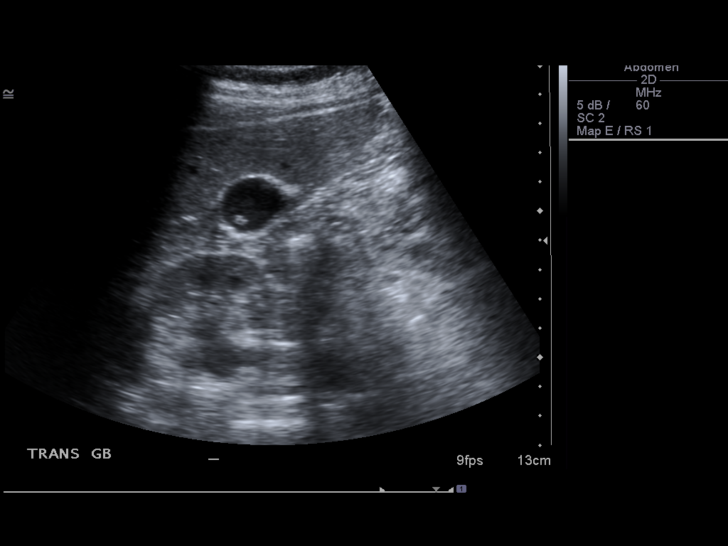
[im 73/73]
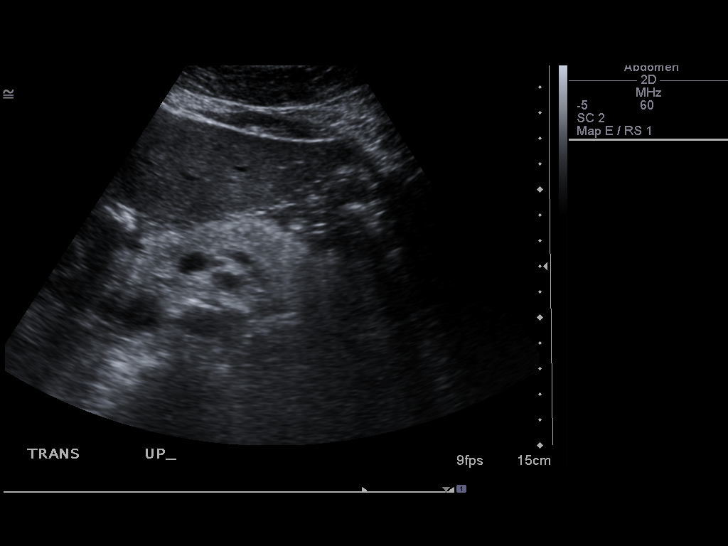

[13 of 25 positions shown; findings below may reference images not displayed]

FINDINGS: Gallbladder: There is a gallstone within the gallbladder.  The
calculus has a greatest diameter of 6 mm. No gallbladder wall
thickening or pericholecystic fluid. The gallbladder wall thickness
measured 2.3 mm. No sonographic Murphy's sign according to the
ultrasound technologist.

CBD: Normal in caliber measuring 3.9 mm. No choledocholithiasis is
evident.

Liver:  Normal size and echotexture without focal parenchymal
abnormality.

IVC:  Patent throughout its visualized course in the abdomen.

Pancreas:  Although the pancreas is difficult to visualize in its
entirety, no focal pancreatic abnormality is identified.

Spleen:  Normal size and echotexture without focal abnormality.

Right kidney:  There is mild fluid distention of the renal pelvis
with distention of at least one infundibulum.  This reflects
pyelectasis or very low grade hydronephrosis.  There is some
cortical thinning and parenchymal loss.  There is slight increased
echogenicity of parenchymal echotexture without focal
abnormalities.  Right renal length is 9.7 cm.

Left kidney:  No hydronephrosis.  Well-preserved cortex.  Normal
parenchymal echotexture without focal abnormalities.  Left renal
length is 10.3 cm.

Aorta:  Maximum diameter is 1.8 cm.  No aneurysm is evident.
Ectasia, plaquing,,and calcifications are present.

Ascites:  None.
IMPRESSION: There is cholelithiasis.

There is minimal fluid distention of the right renal collecting
system consistent with pyelectasis or very low grade
hydronephrosis.  There is right renal cortical thinning.  There is
right renal parenchymal loss.  There is increased echogenicity of
the parenchymal echotexture of the right kidney which may be
associated with medical renal disease.

 No left renal lesion is identified.  There is a flattened contour
of the superior aspect of the upper pole of the left kidney.  This
is felt to be normal for this patient .  It is unchanged from
previous CT examination.

  Ectasia, plaquing, and calcifications of abdominal aorta are
seen.  No aneurysm is evident.

## 2012-01-15 ENCOUNTER — Ambulatory Visit (INDEPENDENT_AMBULATORY_CARE_PROVIDER_SITE_OTHER): Payer: Medicaid Other | Admitting: Internal Medicine

## 2012-01-15 DIAGNOSIS — E538 Deficiency of other specified B group vitamins: Secondary | ICD-10-CM

## 2012-01-15 DIAGNOSIS — D649 Anemia, unspecified: Secondary | ICD-10-CM

## 2012-01-31 ENCOUNTER — Emergency Department (HOSPITAL_COMMUNITY)
Admission: EM | Admit: 2012-01-31 | Discharge: 2012-02-01 | Disposition: A | Discharge status: Other Acute Inpatient Hospital | Payer: Medicaid Other | Attending: Emergency Medicine | Admitting: Emergency Medicine

## 2012-01-31 ENCOUNTER — Encounter (HOSPITAL_COMMUNITY): Payer: Self-pay | Admitting: Emergency Medicine

## 2012-01-31 DIAGNOSIS — E785 Hyperlipidemia, unspecified: Secondary | ICD-10-CM | POA: Insufficient documentation

## 2012-01-31 DIAGNOSIS — R45851 Suicidal ideations: Secondary | ICD-10-CM | POA: Insufficient documentation

## 2012-01-31 DIAGNOSIS — E119 Type 2 diabetes mellitus without complications: Secondary | ICD-10-CM | POA: Insufficient documentation

## 2012-01-31 DIAGNOSIS — Z79899 Other long term (current) drug therapy: Secondary | ICD-10-CM | POA: Insufficient documentation

## 2012-01-31 DIAGNOSIS — F4320 Adjustment disorder, unspecified: Secondary | ICD-10-CM

## 2012-01-31 DIAGNOSIS — F419 Anxiety disorder, unspecified: Secondary | ICD-10-CM

## 2012-01-31 DIAGNOSIS — Z860100 Personal history of colon polyps, unspecified: Secondary | ICD-10-CM | POA: Insufficient documentation

## 2012-01-31 DIAGNOSIS — J45909 Unspecified asthma, uncomplicated: Secondary | ICD-10-CM | POA: Insufficient documentation

## 2012-01-31 DIAGNOSIS — F329 Major depressive disorder, single episode, unspecified: Secondary | ICD-10-CM

## 2012-01-31 DIAGNOSIS — I2510 Atherosclerotic heart disease of native coronary artery without angina pectoris: Secondary | ICD-10-CM | POA: Insufficient documentation

## 2012-01-31 DIAGNOSIS — I10 Essential (primary) hypertension: Secondary | ICD-10-CM | POA: Insufficient documentation

## 2012-01-31 DIAGNOSIS — F4323 Adjustment disorder with mixed anxiety and depressed mood: Secondary | ICD-10-CM | POA: Insufficient documentation

## 2012-01-31 DIAGNOSIS — Z8739 Personal history of other diseases of the musculoskeletal system and connective tissue: Secondary | ICD-10-CM | POA: Insufficient documentation

## 2012-01-31 DIAGNOSIS — Z862 Personal history of diseases of the blood and blood-forming organs and certain disorders involving the immune mechanism: Secondary | ICD-10-CM | POA: Insufficient documentation

## 2012-01-31 DIAGNOSIS — K7689 Other specified diseases of liver: Secondary | ICD-10-CM | POA: Insufficient documentation

## 2012-01-31 DIAGNOSIS — Z8679 Personal history of other diseases of the circulatory system: Secondary | ICD-10-CM | POA: Insufficient documentation

## 2012-01-31 DIAGNOSIS — Z8719 Personal history of other diseases of the digestive system: Secondary | ICD-10-CM | POA: Insufficient documentation

## 2012-01-31 DIAGNOSIS — K219 Gastro-esophageal reflux disease without esophagitis: Secondary | ICD-10-CM | POA: Insufficient documentation

## 2012-01-31 DIAGNOSIS — M79609 Pain in unspecified limb: Secondary | ICD-10-CM | POA: Insufficient documentation

## 2012-01-31 DIAGNOSIS — Z87891 Personal history of nicotine dependence: Secondary | ICD-10-CM | POA: Insufficient documentation

## 2012-01-31 DIAGNOSIS — I252 Old myocardial infarction: Secondary | ICD-10-CM | POA: Insufficient documentation

## 2012-01-31 LAB — RAPID URINE DRUG SCREEN, HOSP PERFORMED
Amphetamines: NOT DETECTED
Cocaine: NOT DETECTED
Opiates: NOT DETECTED
Tetrahydrocannabinol: NOT DETECTED

## 2012-01-31 LAB — CBC
Hemoglobin: 9.7 g/dL — ABNORMAL LOW (ref 12.0–15.0)
MCH: 22.7 pg — ABNORMAL LOW (ref 26.0–34.0)
MCV: 78.7 fL (ref 78.0–100.0)
Platelets: 435 10*3/uL — ABNORMAL HIGH (ref 150–400)
RBC: 4.28 MIL/uL (ref 3.87–5.11)
WBC: 8.8 10*3/uL (ref 4.0–10.5)

## 2012-01-31 LAB — URINALYSIS, ROUTINE W REFLEX MICROSCOPIC
Glucose, UA: NEGATIVE mg/dL
Hgb urine dipstick: NEGATIVE
Protein, ur: NEGATIVE mg/dL
Specific Gravity, Urine: 1.009 (ref 1.005–1.030)

## 2012-01-31 LAB — COMPREHENSIVE METABOLIC PANEL
ALT: 12 U/L (ref 0–35)
AST: 12 U/L (ref 0–37)
CO2: 29 mEq/L (ref 19–32)
Chloride: 100 mEq/L (ref 96–112)
GFR calc Af Amer: 74 mL/min — ABNORMAL LOW (ref >90)
GFR calc non Af Amer: 64 mL/min — ABNORMAL LOW (ref >90)
Glucose, Bld: 111 mg/dL — ABNORMAL HIGH (ref 70–99)
Sodium: 137 mEq/L (ref 135–145)
Total Bilirubin: 0.4 mg/dL (ref 0.3–1.2)

## 2012-01-31 LAB — URINE MICROSCOPIC-ADD ON

## 2012-01-31 MED ORDER — METOPROLOL SUCCINATE ER 50 MG PO TB24
50.0000 mg | ORAL_TABLET | Freq: Every day | ORAL | Status: DC
Start: 2012-02-01 — End: 2012-02-01
  Administered 2012-02-01: 50 mg via ORAL
  Filled 2012-01-31: qty 1

## 2012-01-31 MED ORDER — LORAZEPAM 1 MG PO TABS
1.0000 mg | ORAL_TABLET | Freq: Three times a day (TID) | ORAL | Status: DC | PRN
  Administered 2012-01-31: 1 mg via ORAL
  Filled 2012-01-31: qty 1

## 2012-01-31 MED ORDER — AMLODIPINE BESYLATE 5 MG PO TABS
5.0000 mg | ORAL_TABLET | Freq: Every day | ORAL | Status: DC
  Administered 2012-02-01: 5 mg via ORAL
  Filled 2012-01-31: qty 1

## 2012-01-31 MED ORDER — SERTRALINE HCL 50 MG PO TABS
75.0000 mg | ORAL_TABLET | Freq: Every day | ORAL | Status: DC
  Administered 2012-02-01: 75 mg via ORAL
  Filled 2012-01-31: qty 2

## 2012-01-31 MED ORDER — ATORVASTATIN CALCIUM 20 MG PO TABS: 20.0000 mg | ORAL_TABLET | Freq: Every day | ORAL | Status: DC

## 2012-01-31 MED ORDER — METFORMIN HCL 500 MG PO TABS
500.0000 mg | ORAL_TABLET | Freq: Two times a day (BID) | ORAL | Status: DC
  Administered 2012-02-01: 500 mg via ORAL
  Filled 2012-01-31 (×3): qty 1

## 2012-01-31 MED ORDER — ALPRAZOLAM 0.5 MG PO TABS: 1.0000 mg | ORAL_TABLET | Freq: Three times a day (TID) | ORAL | Status: DC | PRN

## 2012-01-31 MED ORDER — CARVEDILOL 6.25 MG PO TABS: 6.2500 mg | ORAL_TABLET | Freq: Two times a day (BID) | ORAL | Status: DC

## 2012-01-31 MED ORDER — FAMOTIDINE 20 MG PO TABS
20.0000 mg | ORAL_TABLET | Freq: Two times a day (BID) | ORAL | Status: DC
  Administered 2012-01-31 – 2012-02-01 (×2): 20 mg via ORAL
  Filled 2012-01-31 (×2): qty 1

## 2012-01-31 MED ORDER — HYDROXYZINE HCL 25 MG PO TABS
50.0000 mg | ORAL_TABLET | Freq: Two times a day (BID) | ORAL | Status: DC
  Administered 2012-01-31 – 2012-02-01 (×2): 50 mg via ORAL
  Filled 2012-01-31 (×2): qty 2

## 2012-01-31 NOTE — ED Notes (Signed)
Pt brought in by [facility] from [facility]  Pt is under IVC  Paperwork states she is severely depressed and having crying spells, has psycho-mental retardation and has been unable to care for herself after the death of her sone on January 20, 2023  Pt is not eating or sleeping  Pt called mobile crisis feeling suicidal today  B[facility] Sessions is not able to care for pt due to her dietary restrictions and pt is unsteady and facility does not allow for safe ambulation

## 2012-01-31 NOTE — ED Notes (Signed)
[id number] Expected date:<BR> Expected time:<BR> Means of arrival:<BR> Comments:<BR> Hold for consult rm

## 2012-01-31 NOTE — ED Provider Notes (Signed)
History     CSN: [id number]  Arrival date & time 01/31/12  1845   First MD Initiated Contact with Patient 01/31/12 2056      Chief Complaint  Patient presents with  . Medical Clearance    (Consider location/radiation/quality/duration/timing/severity/associated sxs/prior treatment) HPI This 61 year old has been grieving her son's death the last week and half and is so anxious depressed crying and breathing that she's been unable to care for herself and she is not eating or sleeping, she is feeling suicidal now and she readily dead than alive, she went to [facility] who sent her to the emergency department as an involuntary commitment, the patient is not homicidal and not hallucinating. There is no treatment prior to arrival. At baseline she walks with a limp and uses a cane due to chronic right leg pain which is stable. She also has chronic anemia. Past Medical History  Diagnosis Date  . History of colonic polyps   . GERD (gastroesophageal reflux disease)   . Hyperlipidemia   . Diabetes mellitus without mention of complication   . Hypertension   . Myocardial infarction   . Coronary artery disease   . Fatty liver disease, nonalcoholic   . Cholelithiasis   . Iron deficiency anemia   . Angina   . Shortness of breath   . Asthma   . Arthritis     HANDS"    Past Surgical History  Procedure Date  . Right colectomy     After colonic perforation  . Tubal ligation   . Coronary angioplasty with stent placement   . Esophagogastroduodenoscopy 11/30/2011    Procedure: ESOPHAGOGASTRODUODENOSCOPY (EGD);  Surgeon: Lafayette Dragon, MD;  Location: [facility];  Service: Endoscopy;  Laterality: N/A;  . Colonoscopy 11/30/2011    Procedure: COLONOSCOPY;  Surgeon: Lafayette Dragon, MD;  Location: [facility];  Service: Endoscopy;  Laterality: N/A;    Family History  Problem Relation Age of Onset  . Heart disease Mother   . Diabetes Mother   . Colon cancer Neg Hx   . Cancer Father     unsure  what kind  . Diabetes Sister     History  Substance Use Topics  . Smoking status: Former Smoker -- 0.5 packs/day for 47 years    Types: Cigarettes    Quit date: 05/09/2010  . Smokeless tobacco: Never Used  . Alcohol Use: No    OB History    Grav Para Term Preterm Abortions TAB SAB Ect Mult Living                  Review of Systems 10 Systems reviewed and are negative for acute change except as noted in the HPI. Allergies  Review of patient's allergies indicates no known allergies.  Home Medications   No current outpatient prescriptions on file.  BP 127/64  Pulse 80  Temp 98.2 F (36.8 C) (Oral)  Resp 18  SpO2 95%  Physical Exam  Nursing note and vitals reviewed. Constitutional: She is oriented to person, place, and time.       Awake, alert, nontoxic appearance with baseline speech for patient.  HENT:  Head: Atraumatic.  Mouth/Throat: No oropharyngeal exudate.  Eyes: EOM are normal. Pupils are equal, round, and reactive to light. Right eye exhibits no discharge. Left eye exhibits no discharge.  Neck: Neck supple.  Cardiovascular: Normal rate and regular rhythm.   No murmur heard. Pulmonary/Chest: Effort normal and breath sounds normal. No stridor. No respiratory distress. She has no wheezes.  She has no rales. She exhibits no tenderness.  Abdominal: Soft. Bowel sounds are normal. She exhibits no mass. There is no tenderness. There is no rebound.  Musculoskeletal: She exhibits no tenderness.       Baseline ROM, moves extremities with no obvious new focal weakness.  Lymphadenopathy:    She has no cervical adenopathy.  Neurological: She is alert and oriented to person, place, and time.       Awake, alert, cooperative and aware of situation; motor strength bilaterally; sensation normal to light touch bilaterally; peripheral visual fields full to confrontation; no facial asymmetry; tongue midline; major cranial nerves appear intact; no pronator drift, normal finger to  nose bilaterally  Skin: No rash noted.  Psychiatric:       Anxious depressed and tearful    ED Course  Procedures (including critical care time) Will move to Psych ED as IVC and consult ACT/Tele-Psych. 2115  Tetepsych recs admit. 2300 Meds ordered per telePsych recs.  Labs Reviewed  URINALYSIS, ROUTINE W REFLEX MICROSCOPIC - Abnormal; Notable for the following:    Leukocytes, UA SMALL (*)     All other components within normal limits  URINE MICROSCOPIC-ADD ON - Abnormal; Notable for the following:    Squamous Epithelial / LPF FEW (*)     All other components within normal limits  CBC - Abnormal; Notable for the following:    Hemoglobin 9.7 (*)     HCT 33.7 (*)     MCH 22.7 (*)     MCHC 28.8 (*)     RDW 16.0 (*)     Platelets 435 (*)     All other components within normal limits  COMPREHENSIVE METABOLIC PANEL - Abnormal; Notable for the following:    Glucose, Bld 111 (*)     Total Protein 8.8 (*)     Alkaline Phosphatase 139 (*)     GFR calc non Af Amer 64 (*)     GFR calc Af Amer 74 (*)     All other components within normal limits  GLUCOSE, CAPILLARY - Abnormal; Notable for the following:    Glucose-Capillary 114 (*)     All other components within normal limits  GLUCOSE, CAPILLARY  URINE RAPID DRUG SCREEN (HOSP PERFORMED)  ETHANOL   No results found.   1. Suicidal ideation   2. Depression   3. Adjustment disorder   4. Anxiety       MDM  Patient understands and agrees with initial ED impression and plan with expectations set for ED visit.        Babette Relic, MD 02/01/12 [phone]

## 2012-02-01 ENCOUNTER — Inpatient Hospital Stay (HOSPITAL_COMMUNITY)
Admission: AD | Admit: 2012-02-01 | Discharge: 2012-02-06 | DRG: 881 | Disposition: A | Discharge status: Home / Self Care | Payer: Medicaid Other | Source: Ambulatory Visit | Attending: Emergency Medicine | Admitting: Emergency Medicine

## 2012-02-01 ENCOUNTER — Encounter (HOSPITAL_COMMUNITY): Payer: Self-pay | Admitting: Intensive Care

## 2012-02-01 DIAGNOSIS — M19049 Primary osteoarthritis, unspecified hand: Secondary | ICD-10-CM | POA: Diagnosis present

## 2012-02-01 DIAGNOSIS — D509 Iron deficiency anemia, unspecified: Secondary | ICD-10-CM

## 2012-02-01 DIAGNOSIS — F329 Major depressive disorder, single episode, unspecified: Secondary | ICD-10-CM

## 2012-02-01 DIAGNOSIS — Z87891 Personal history of nicotine dependence: Secondary | ICD-10-CM

## 2012-02-01 DIAGNOSIS — I2510 Atherosclerotic heart disease of native coronary artery without angina pectoris: Secondary | ICD-10-CM | POA: Diagnosis present

## 2012-02-01 DIAGNOSIS — D649 Anemia, unspecified: Secondary | ICD-10-CM

## 2012-02-01 DIAGNOSIS — F3289 Other specified depressive episodes: Principal | ICD-10-CM | POA: Diagnosis present

## 2012-02-01 DIAGNOSIS — R079 Chest pain, unspecified: Secondary | ICD-10-CM | POA: Diagnosis not present

## 2012-02-01 DIAGNOSIS — R0989 Other specified symptoms and signs involving the circulatory and respiratory systems: Secondary | ICD-10-CM

## 2012-02-01 DIAGNOSIS — F411 Generalized anxiety disorder: Secondary | ICD-10-CM | POA: Diagnosis present

## 2012-02-01 DIAGNOSIS — R109 Unspecified abdominal pain: Secondary | ICD-10-CM | POA: Diagnosis present

## 2012-02-01 DIAGNOSIS — D126 Benign neoplasm of colon, unspecified: Secondary | ICD-10-CM

## 2012-02-01 DIAGNOSIS — D539 Nutritional anemia, unspecified: Secondary | ICD-10-CM | POA: Diagnosis present

## 2012-02-01 DIAGNOSIS — K219 Gastro-esophageal reflux disease without esophagitis: Secondary | ICD-10-CM | POA: Diagnosis present

## 2012-02-01 DIAGNOSIS — E785 Hyperlipidemia, unspecified: Secondary | ICD-10-CM | POA: Diagnosis present

## 2012-02-01 DIAGNOSIS — R195 Other fecal abnormalities: Secondary | ICD-10-CM

## 2012-02-01 DIAGNOSIS — I219 Acute myocardial infarction, unspecified: Secondary | ICD-10-CM

## 2012-02-01 DIAGNOSIS — I10 Essential (primary) hypertension: Secondary | ICD-10-CM | POA: Diagnosis present

## 2012-02-01 DIAGNOSIS — K7689 Other specified diseases of liver: Secondary | ICD-10-CM | POA: Diagnosis present

## 2012-02-01 DIAGNOSIS — J45909 Unspecified asthma, uncomplicated: Secondary | ICD-10-CM | POA: Diagnosis present

## 2012-02-01 DIAGNOSIS — F32A Depression, unspecified: Secondary | ICD-10-CM | POA: Diagnosis present

## 2012-02-01 DIAGNOSIS — Z860100 Personal history of colon polyps, unspecified: Secondary | ICD-10-CM

## 2012-02-01 DIAGNOSIS — R05 Cough: Secondary | ICD-10-CM

## 2012-02-01 DIAGNOSIS — I252 Old myocardial infarction: Secondary | ICD-10-CM

## 2012-02-01 DIAGNOSIS — K8020 Calculus of gallbladder without cholecystitis without obstruction: Secondary | ICD-10-CM | POA: Diagnosis present

## 2012-02-01 DIAGNOSIS — Z634 Disappearance and death of family member: Secondary | ICD-10-CM

## 2012-02-01 DIAGNOSIS — R0602 Shortness of breath: Secondary | ICD-10-CM | POA: Diagnosis present

## 2012-02-01 DIAGNOSIS — Z79899 Other long term (current) drug therapy: Secondary | ICD-10-CM

## 2012-02-01 DIAGNOSIS — E119 Type 2 diabetes mellitus without complications: Secondary | ICD-10-CM | POA: Diagnosis present

## 2012-02-01 DIAGNOSIS — R45851 Suicidal ideations: Secondary | ICD-10-CM

## 2012-02-01 LAB — GLUCOSE, CAPILLARY
Glucose-Capillary: 114 mg/dL — ABNORMAL HIGH (ref 70–99)
Glucose-Capillary: 104 mg/dL — ABNORMAL HIGH (ref 70–99)

## 2012-02-01 MED ORDER — SODIUM CHLORIDE 0.9 % IV BOLUS (SEPSIS)
1000.0000 mL | Freq: Once | INTRAVENOUS | Status: AC
  Administered 2012-02-01: 1000 mL via INTRAVENOUS

## 2012-02-01 MED ORDER — ALUM & MAG HYDROXIDE-SIMETH 200-200-20 MG/5ML PO SUSP
30.0000 mL | ORAL | Status: DC | PRN
  Administered 2012-02-03: 30 mL via ORAL

## 2012-02-01 MED ORDER — METOPROLOL SUCCINATE ER 50 MG PO TB24
50.0000 mg | ORAL_TABLET | Freq: Every day | ORAL | Status: DC
  Administered 2012-02-02 – 2012-02-06 (×5): 50 mg via ORAL
  Filled 2012-02-01 (×8): qty 1

## 2012-02-01 MED ORDER — AMLODIPINE BESYLATE 5 MG PO TABS
5.0000 mg | ORAL_TABLET | Freq: Every day | ORAL | Status: DC
  Administered 2012-02-02 – 2012-02-06 (×5): 5 mg via ORAL
  Filled 2012-02-01 (×8): qty 1

## 2012-02-01 MED ORDER — ACETAMINOPHEN 325 MG PO TABS
650.0000 mg | ORAL_TABLET | Freq: Four times a day (QID) | ORAL | Status: DC | PRN
  Administered 2012-02-02 – 2012-02-03 (×3): 650 mg via ORAL

## 2012-02-01 MED ORDER — BUDESONIDE-FORMOTEROL FUMARATE 160-4.5 MCG/ACT IN AERO
2.0000 | INHALATION_SPRAY | Freq: Two times a day (BID) | RESPIRATORY_TRACT | Status: DC
  Administered 2012-02-01 – 2012-02-06 (×9): 2 via RESPIRATORY_TRACT
  Filled 2012-02-01 (×2): qty 6

## 2012-02-01 MED ORDER — SERTRALINE HCL 50 MG PO TABS
75.0000 mg | ORAL_TABLET | Freq: Every day | ORAL | Status: DC
  Administered 2012-02-02 – 2012-02-06 (×5): 75 mg via ORAL
  Filled 2012-02-01 (×8): qty 1

## 2012-02-01 MED ORDER — HYDROXYZINE HCL 50 MG PO TABS
50.0000 mg | ORAL_TABLET | Freq: Every evening | ORAL | Status: DC | PRN
  Administered 2012-02-02 – 2012-02-05 (×4): 50 mg via ORAL

## 2012-02-01 MED ORDER — ALBUTEROL SULFATE (5 MG/ML) 0.5% IN NEBU: 2.5000 mg | INHALATION_SOLUTION | Freq: Four times a day (QID) | RESPIRATORY_TRACT | Status: DC | PRN

## 2012-02-01 MED ORDER — FAMOTIDINE 20 MG PO TABS
20.0000 mg | ORAL_TABLET | Freq: Two times a day (BID) | ORAL | Status: DC
  Administered 2012-02-02 – 2012-02-06 (×8): 20 mg via ORAL
  Filled 2012-02-01 (×18): qty 1

## 2012-02-01 MED ORDER — LORAZEPAM 1 MG PO TABS
1.0000 mg | ORAL_TABLET | Freq: Three times a day (TID) | ORAL | Status: DC | PRN
  Administered 2012-02-03: 1 mg via ORAL
  Filled 2012-02-01: qty 1

## 2012-02-01 MED ORDER — MAGNESIUM HYDROXIDE 400 MG/5ML PO SUSP: 30.0000 mL | Freq: Every day | ORAL | Status: DC | PRN

## 2012-02-01 MED ORDER — METFORMIN HCL 500 MG PO TABS
500.0000 mg | ORAL_TABLET | Freq: Two times a day (BID) | ORAL | Status: DC
  Administered 2012-02-02: 500 mg via ORAL
  Filled 2012-02-01 (×5): qty 1

## 2012-02-01 NOTE — H&P (Signed)
[name] is an 61 y.o. female.   Chief Complaint: Depression, SI HPI: See Psychiatric Assessment  Past Medical History  Diagnosis Date  . History of colonic polyps   . GERD (gastroesophageal reflux disease)   . Hyperlipidemia   . Diabetes mellitus without mention of complication   . Hypertension   . Myocardial infarction   . Coronary artery disease   . Fatty liver disease, nonalcoholic   . Cholelithiasis   . Iron deficiency anemia   . Angina   . Shortness of breath   . Asthma   . Arthritis     HANDS"    Past Surgical History  Procedure Date  . Right colectomy     After colonic perforation  . Tubal ligation   . Coronary angioplasty with stent placement   . Esophagogastroduodenoscopy 11/30/2011    Procedure: ESOPHAGOGASTRODUODENOSCOPY (EGD);  Surgeon: Lafayette Dragon, MD;  Location: [facility];  Service: Endoscopy;  Laterality: N/A;  . Colonoscopy 11/30/2011    Procedure: COLONOSCOPY;  Surgeon: Lafayette Dragon, MD;  Location: [facility];  Service: Endoscopy;  Laterality: N/A;    Family History  Problem Relation Age of Onset  . Heart disease Mother   . Diabetes Mother   . Colon cancer Neg Hx   . Cancer Father     unsure what kind  . Diabetes Sister    Social History:  reports that she quit smoking about 20 months ago. Her smoking use included Cigarettes. She has a 23.5 pack-year smoking history. She has never used smokeless tobacco. She reports that she does not drink alcohol or use illicit drugs.  Allergies: No Known Allergies  Medications Prior to Admission  Medication Sig Dispense Refill  . ALPRAZolam (XANAX) 0.25 MG tablet Take 0.25 mg by mouth at bedtime as needed. For sleep      . amLODipine (NORVASC) 5 MG tablet Take 5 mg by mouth daily.        . carvedilol (COREG) 6.25 MG tablet Take 6.25 mg by mouth 2 (two) times daily with a meal.      . famotidine (PEPCID) 20 MG tablet Take 20 mg by mouth 2 (two) times daily.      . hydrOXYzine (ATARAX/VISTARIL) 25  MG tablet Take 25 mg by mouth 2 (two) times daily.      . metFORMIN (GLUCOPHAGE) 500 MG tablet Take 500 mg by mouth 2 (two) times daily with a meal.      . metoprolol succinate (TOPROL-XL) 50 MG 24 hr tablet Take 50 mg by mouth daily. Take with or immediately following a meal.      . nitroGLYCERIN (NITROSTAT) 0.4 MG SL tablet Place 0.4 mg under the tongue every 5 (five) minutes as needed. For chest pain      . sertraline (ZOLOFT) 25 MG tablet Take 25 mg by mouth daily.        Results for orders placed during the hospital encounter of 01/31/12 (from the past 48 hour(s))  GLUCOSE, CAPILLARY     Status: Normal   Collection Time   01/31/12  7:10 PM      Component Value Range Comment   Glucose-Capillary 91  70 - 99 mg/dL    Comment 1 Documented in Chart      Comment 2 Notify RN     URINALYSIS, ROUTINE W REFLEX MICROSCOPIC     Status: Abnormal   Collection Time   01/31/12  7:19 PM      Component Value Range Comment  Color, Urine YELLOW  YELLOW    APPearance CLEAR  CLEAR    Specific Gravity, Urine 1.009  1.005 - 1.030    pH 6.0  5.0 - 8.0    Glucose, UA NEGATIVE  NEGATIVE mg/dL    Hgb urine dipstick NEGATIVE  NEGATIVE    Bilirubin Urine NEGATIVE  NEGATIVE    Ketones, ur NEGATIVE  NEGATIVE mg/dL    Protein, ur NEGATIVE  NEGATIVE mg/dL    Urobilinogen, UA 1.0  0.0 - 1.0 mg/dL    Nitrite NEGATIVE  NEGATIVE    Leukocytes, UA SMALL (*) NEGATIVE   URINE MICROSCOPIC-ADD ON     Status: Abnormal   Collection Time   01/31/12  7:19 PM      Component Value Range Comment   Squamous Epithelial / LPF FEW (*) RARE    WBC, UA 0-2  <3 WBC/hpf    RBC / HPF 0-2  <3 RBC/hpf    Bacteria, UA RARE  RARE   URINE RAPID DRUG SCREEN (HOSP PERFORMED)     Status: Normal   Collection Time   01/31/12  7:20 PM      Component Value Range Comment   Opiates NONE DETECTED  NONE DETECTED    Cocaine NONE DETECTED  NONE DETECTED    Benzodiazepines NONE DETECTED  NONE DETECTED    Amphetamines NONE DETECTED  NONE  DETECTED    Tetrahydrocannabinol NONE DETECTED  NONE DETECTED    Barbiturates NONE DETECTED  NONE DETECTED   CBC     Status: Abnormal   Collection Time   01/31/12  8:18 PM      Component Value Range Comment   WBC 8.8  4.0 - 10.5 K/uL    RBC 4.28  3.87 - 5.11 MIL/uL    Hemoglobin 9.7 (*) 12.0 - 15.0 g/dL    HCT 33.7 (*) 36.0 - 46.0 %    MCV 78.7  78.0 - 100.0 fL    MCH 22.7 (*) 26.0 - 34.0 pg    MCHC 28.8 (*) 30.0 - 36.0 g/dL    RDW 16.0 (*) 11.5 - 15.5 %    Platelets 435 (*) 150 - 400 K/uL   COMPREHENSIVE METABOLIC PANEL     Status: Abnormal   Collection Time   01/31/12  8:18 PM      Component Value Range Comment   Sodium 137  135 - 145 mEq/L    Potassium 3.9  3.5 - 5.1 mEq/L    Chloride 100  96 - 112 mEq/L    CO2 29  19 - 32 mEq/L    Glucose, Bld 111 (*) 70 - 99 mg/dL    BUN 10  6 - 23 mg/dL    Creatinine, Ser 0.93  0.50 - 1.10 mg/dL    Calcium 9.8  8.4 - 10.5 mg/dL    Total Protein 8.8 (*) 6.0 - 8.3 g/dL    Albumin 3.5  3.5 - 5.2 g/dL    AST 12  0 - 37 U/L    ALT 12  0 - 35 U/L    Alkaline Phosphatase 139 (*) 39 - 117 U/L    Total Bilirubin 0.4  0.3 - 1.2 mg/dL    GFR calc non Af Amer 64 (*) >90 mL/min    GFR calc Af Amer 74 (*) >90 mL/min   ETHANOL     Status: Normal   Collection Time   01/31/12  8:18 PM      Component Value Range Comment   Alcohol,  Ethyl (B) <11  0 - 11 mg/dL   GLUCOSE, CAPILLARY     Status: Abnormal   Collection Time   02/01/12  7:21 AM      Component Value Range Comment   Glucose-Capillary 114 (*) 70 - 99 mg/dL    Comment 1 Notify RN      No results found.  Review of Systems  HENT: Positive for congestion and tinnitus. Negative for nosebleeds, sore throat, neck pain and ear discharge.   Eyes: Positive for blurred vision (Wears glasses  states that she does need new glasses) and double vision. Negative for photophobia, pain, discharge and redness.  Respiratory: Positive for sputum production (white mucus), shortness of breath and wheezing (HX  of asthma, COPD). Negative for cough, hemoptysis and stridor.   Cardiovascular: Positive for chest pain (Patient states that it feels like something is pushing on her chest.  States that it happens often), orthopnea (Patient states that she is unable to lye flat.  States that she has to be proped up  which helps with the SOB), leg swelling (Patient states that at times she will have swelling in the legs but not at this time) and PND. Negative for palpitations and claudication.  Gastrointestinal: Positive for heartburn and abdominal pain (Patient states that she has constant stomach pain and PCP has prescribe several diferent medications that hasn't worked). Negative for diarrhea, constipation, blood in stool (In past.  Patient states that she is unsure at this time) and melena.  Genitourinary: Positive for dysuria, urgency, frequency and flank pain.  Musculoskeletal: Positive for myalgias, back pain (Patient states that she has constant back pain.), joint pain (States that at times it is difficult to open hands related to stiffness and pain.) and falls (Patient states that she has frequent falls.  States that she has not fallen in the last week.  States that her legs give out on her.  ).  Skin: Negative.   Neurological: Positive for dizziness, tingling (bilat hands), tremors, weakness and headaches. Negative for sensory change, speech change, focal weakness, seizures and loss of consciousness.  Endo/Heme/Allergies: Positive for environmental allergies (gras, pollen, ). Negative for polydipsia. Does not bruise/bleed easily.  Psychiatric/Behavioral: Positive for depression, suicidal ideas and memory loss (difficulty remebering things in the past.). Negative for substance abuse (1 cig a day.  does not drink beer anymore). Hallucinations: "I hear my children calling me.  They tell me mama everything is going to be alright. The patient is nervous/anxious and has insomnia.     Blood pressure 146/80, pulse 80,  temperature 98.5 F (36.9 C), temperature source Oral, resp. rate 18, height 4' 7.51" (1.41 m), weight 60.328 kg (133 lb). Physical Exam  Constitutional: She appears well-developed and well-nourished.  HENT:  Head: Normocephalic.  Eyes: Pupils are equal, round, and reactive to light. Right eye exhibits no discharge. Left eye exhibits no discharge.  Neck: Normal range of motion. Neck supple. No JVD present. No tracheal deviation present. No thyromegaly present.  Cardiovascular: Normal rate, regular rhythm, normal heart sounds and intact distal pulses.  Exam reveals no gallop and no friction rub.   No murmur heard. Respiratory: No stridor. She has wheezes.  GI: Soft. Bowel sounds are normal. She exhibits no distension and no mass. There is no tenderness. There is no rebound and no guarding.  Musculoskeletal: Normal range of motion. She exhibits tenderness. She exhibits no edema.       Full ROM with some stiffness and pain with movement  Neurological:  She is alert. She has normal reflexes. She displays no atrophy, no tremor and normal reflexes. No cranial nerve deficit. She exhibits normal muscle tone. She displays a negative Romberg sign. She displays no seizure activity. Coordination and gait abnormal. She displays no Babinski's sign on the right side. She displays no Babinski's sign on the left side.  Reflex Scores:      Bicep reflexes are 2+ on the right side and 2+ on the left side.      Brachioradialis reflexes are 2+ on the right side and 2+ on the left side.      Patellar reflexes are 2+ on the right side and 2+ on the left side.      Achilles reflexes are 2+ on the right side and 2+ on the left side.      Patient able to ambulate slowly with use of cain.  No noted limp.    Skin: Skin is warm and dry. No rash noted. No erythema. No pallor.  Psychiatric: She is slowed. Cognition and memory are impaired. She exhibits a depressed mood. She expresses suicidal ideation. She exhibits abnormal  remote memory.     Assessment/Plan Will have hospitalist to assess patient medication to see if any adjustments are needed.  Will check stool for blood.  Will continue to monitor and address any issues that arise.  Patient will be a 1:! For assistance with ADL's and ambulation.  Fall risk.  Rankin, Shuvon 02/01/2012, 4:39 PM

## 2012-02-01 NOTE — Progress Notes (Signed)
Patient told me that her son died 02-08-2023. This is her third son who has died. They have died four years apart. She has one son left, the oldest, who does not live near here. She is caring for grandson. She told me, "I do not want to kill myself. I don't know why they keep saying that." We explored this. She told me that she had said that she was depressed. She is not sleeping or eating well. She cried during our meeting as she talked about her losses and her own health issues that keep her "from traveling." She is originally from [place] (she calls it "home") and she would like to go back there to be with family, but she says her "breathing and heart" keep her from going there. The funeral for her son is there today. Family, including her remaining son, are there. I offered compassionate, empathetic listening. Brought her a prayer shawl that was placed with her belongings. She requested prayer, prayed.

## 2012-02-01 NOTE — Progress Notes (Signed)
D - Patient sleeping in bed quietly, patient did not attend tonight's group. Patient sad with flat affect, minimal conversation only answered direct questions. Patient denies SI/HI and hallucinations at this time.  A - Patient offered encouragement and support through therapeutic conversation. Encouraged patient to speak with staff about any concerns or questions. Medications given as ordered.  R - Patient safety maintained with 1:1 sitter. 1:1 sitter continued to assist with patients ADL's.

## 2012-02-01 NOTE — ED Notes (Signed)
Chaplain called to come see pt, per pt request.

## 2012-02-01 NOTE — ED Notes (Signed)
RN Ander Purpura advised patient was ready to go across street with [employer]. [employer] given patient's paper work and 2 bag of belongings

## 2012-02-01 NOTE — BHH Counselor (Signed)
Patient [name]) accepted to [facility] 02/01/2012 for inpatient treatment. Patient accepted  by Genella Mech, NP and the attending physician will be Dr. Elvin So. Patients Axis I diagnosis is Acute Bereavement. EDP-Dr. Venora Maples made aware and agrees to place patient up for discharge for transfer to [facility]. This Probation officer will also make patients nurse aware of disposition. Call report # is [phone]. Patient is under IVC and will need to be transported via [employer].

## 2012-02-01 NOTE — Progress Notes (Signed)
Psychoeducational Group Note  Date:  02/01/2012 Time:  2000  Group Topic/Focus:  Wrap-Up Group:   The focus of this group is to help patients review their daily goal of treatment and discuss progress on daily workbooks.  Participation Level:  Did Not Attend  Participation Quality:  Drowsy  Affect:  Appropriate  Cognitive:  Appropriate  Insight:  Poor  Engagement in Group:  None  Additional Comments:  Patient was asleep in bed during group.  Noel Christmas 02/01/2012, 9:21 PM

## 2012-02-01 NOTE — BH Assessment (Signed)
Assessment Note   [name] is an 61 y.o. female that was advised to contact [facility] last night after endorsing suicidal thoughts and "just not wanting to be on this Earth anymore."  Pt was sent over by [facility] involuntarily d/t her need for medical clearance.  Pt is tearful and despondent regarding the death of her third child on February 15, 2023.  "I've already lost three.  I only have one more."  Pt voices apathy and despair over her losses and has decompensated significantly since his death.  Pt admits not eating or drinking as she should, especially with her medical issues, and having decreased sleep "I can't sleep unless I take medication."  Pt voices having a supportive Grandson and Care Giver, but "everyone else does not live close."  Pt denies HI or any active psychosis.  Pt denies a previous psychiatric history, inpatient or outpatient.  Pt admits feeling that she needs to be here and is unable to appropriately care for herself in this state. Pt is unable to contract for safety.  Notified Dr. Venora Maples and he will be hydrating pt and assuring that her labs remain stable for a psychiatric admission.    Axis I: Acute Bereavement  Axis II: Deferred Axis III:  Past Medical History  Diagnosis Date  . History of colonic polyps   . GERD (gastroesophageal reflux disease)   . Hyperlipidemia   . Diabetes mellitus without mention of complication   . Hypertension   . Myocardial infarction   . Coronary artery disease   . Fatty liver disease, nonalcoholic   . Cholelithiasis   . Iron deficiency anemia   . Angina   . Shortness of breath   . Asthma   . Arthritis     HANDS"   Axis IV: other psychosocial or environmental problems, problems related to social environment and problems with primary support group Axis V: 31-40 impairment in reality testing  Past Medical History:  Past Medical History  Diagnosis Date  . History of colonic polyps   . GERD (gastroesophageal reflux disease)   .  Hyperlipidemia   . Diabetes mellitus without mention of complication   . Hypertension   . Myocardial infarction   . Coronary artery disease   . Fatty liver disease, nonalcoholic   . Cholelithiasis   . Iron deficiency anemia   . Angina   . Shortness of breath   . Asthma   . Arthritis     HANDS"    Past Surgical History  Procedure Date  . Right colectomy     After colonic perforation  . Tubal ligation   . Coronary angioplasty with stent placement   . Esophagogastroduodenoscopy 11/30/2011    Procedure: ESOPHAGOGASTRODUODENOSCOPY (EGD);  Surgeon: Lafayette Dragon, MD;  Location: [facility];  Service: Endoscopy;  Laterality: N/A;  . Colonoscopy 11/30/2011    Procedure: COLONOSCOPY;  Surgeon: Lafayette Dragon, MD;  Location: [facility];  Service: Endoscopy;  Laterality: N/A;    Family History:  Family History  Problem Relation Age of Onset  . Heart disease Mother   . Diabetes Mother   . Colon cancer Neg Hx   . Cancer Father     unsure what kind  . Diabetes Sister     Social History:  reports that she quit smoking about 20 months ago. Her smoking use included Cigarettes. She has a 23.5 pack-year smoking history. She has never used smokeless tobacco. She reports that she does not drink alcohol or use illicit drugs.  Additional Social History:  Alcohol / Drug Use Pain Medications: Yes; See MAR Prescriptions: Yes; See MAR Over the Counter: Yes; See MAR History of alcohol / drug use?: No history of alcohol / drug abuse  CIWA: CIWA-Ar BP: 128/77 mmHg Pulse Rate: 71  COWS:    Allergies: No Known Allergies  Home Medications:  (Not in a hospital admission)  OB/GYN Status:  No LMP recorded. Patient is postmenopausal.  General Assessment Data Location of Assessment: [facility] Living Arrangements: Children Can pt return to current living arrangement?: Yes Admission Status: Involuntary Is patient capable of signing voluntary admission?: Yes Transfer from: [facility] Referral Source: Other Surveyor, minerals)  Education Status Is patient currently in school?: No  Risk to self Suicidal Ideation: Yes-Currently Present Suicidal Intent: No Is patient at risk for suicide?: Yes Suicidal Plan?: No Access to Means: Yes Specify Access to Suicidal Means: pills and sharps available What has been your use of drugs/alcohol within the last 12 months?: none noted Previous Attempts/Gestures: No How many times?: 0  Other Self Harm Risks: physically incapacitated Triggers for Past Attempts: None known Intentional Self Injurious Behavior: None Family Suicide History: No Recent stressful life event(s): Loss (Comment);Trauma (Comment) (three out of four of her sons have passed away) Persecutory voices/beliefs?: No Depression: Yes Depression Symptoms: Despondent;Insomnia;Tearfulness;Fatigue;Guilt;Loss of interest in usual pleasures;Feeling worthless/self pity Substance abuse history and/or treatment for substance abuse?: No Suicide prevention information given to non-admitted patients: Not applicable  Risk to Others Homicidal Ideation: No Thoughts of Harm to Others: No Current Homicidal Intent: No Current Homicidal Plan: No Access to Homicidal Means: No Identified Victim: none per pt History of harm to others?: No Assessment of Violence: None Noted Violent Behavior Description: none noted Does patient have access to weapons?: No Criminal Charges Pending?: No Does patient have a court date: No  Psychosis Hallucinations: None noted Delusions: None noted  Mental Status Report Appear/Hygiene: Disheveled Eye Contact: Fair Motor Activity: Unsteady Speech: Logical/coherent;Slow;Soft Level of Consciousness: Quiet/awake Mood: Depressed;Apathetic;Despair;Sad;Sullen;Helpless;Empty Affect: Apathetic;Blunted;Depressed;Sullen;Sad Anxiety Level: Moderate Thought Processes: Relevant Judgement: Impaired Orientation:  Person;Place;Time;Situation Obsessive Compulsive Thoughts/Behaviors: Moderate  Cognitive Functioning Concentration: Decreased Memory: Recent Impaired;Remote Impaired IQ: Average Insight: Fair Impulse Control: Fair Appetite: Poor Weight Loss:  (unknown but "definitely") Weight Gain: 0  Sleep: Decreased Total Hours of Sleep:  (unable to sleep without medication) Vegetative Symptoms: Staying in bed  ADLScreening [facility] Assessment Services) Patient's cognitive ability adequate to safely complete daily activities?: Yes Patient able to express need for assistance with ADLs?: Yes Independently performs ADLs?: No (see note)  Abuse/Neglect [facility]) Physical Abuse: Denies Verbal Abuse: Denies Sexual Abuse: Denies  Prior Inpatient Therapy Prior Inpatient Therapy: No Prior Therapy Dates: n/a Prior Therapy Facilty/Provider(s): n/a Reason for Treatment: n/a  Prior Outpatient Therapy Prior Outpatient Therapy: No Prior Therapy Dates: n/a Prior Therapy Facilty/Provider(s): n/a Reason for Treatment: n/a  ADL Screening (condition at time of admission) Patient's cognitive ability adequate to safely complete daily activities?: Yes Patient able to express need for assistance with ADLs?: Yes Independently performs ADLs?: No (see note) Weakness of Legs: Both Weakness of Arms/Hands: None  Home Assistive Devices/Equipment Home Assistive Devices/Equipment: Cane (specify quad or straight);Walker (specify type)    Abuse/Neglect Assessment (Assessment to be complete while patient is alone) Physical Abuse: Denies Verbal Abuse: Denies Sexual Abuse: Denies Exploitation of patient/patient's resources: Denies Self-Neglect: Denies Values / Beliefs Cultural Requests During Hospitalization: None Spiritual Requests During Hospitalization: None   Advance Directives (For Healthcare) Advance Directive: Patient would like information  Additional Information 1:1 In Past 12 Months?: No CIRT Risk:  No Elopement Risk: No Does patient have medical clearance?: Yes     Disposition:  Referred to [facility] for inpatient treatment.  Disposition Disposition of Patient: Inpatient treatment program Type of inpatient treatment program: Adult  On Site Evaluation by:   Reviewed with Physician:     Herbert Moors 02/01/2012 8:08 AM

## 2012-02-01 NOTE — ED Notes (Signed)
Patients CBG is 114

## 2012-02-01 NOTE — Progress Notes (Signed)
Orders received for CT of abd/pelvis, ABG, and one view chest xray. Clarified with Dr. Laren Everts when these need to be completed, per MD plan for test tomorrow am. Will continue to monitor pt.

## 2012-02-01 NOTE — Progress Notes (Signed)
Patient pleasant and cooperative with staff. Patient advised of high fall risk status, patient verbalizes understanding. Patient oriented to unit/room, denies any questions/concerns at this time.  Patient 1:1 observation oriented for safety/fall risk. Patient denies SI/HI, denies AVH. Patient safe on unit with Q60minute checks for safety. Will continue to monitor.

## 2012-02-01 NOTE — Consult Note (Signed)
[facility]                                                                                    Patient Demographics  [name], is a 61 y.o. female  CSN: [id number]  MRN: [id number]  DOB - 1950/06/20  Admit Date - 02/01/2012  Outpatient Primary MD for the patient is Elyn Peers, MD   With History of -  Past Medical History  Diagnosis Date  . History of colonic polyps   . GERD (gastroesophageal reflux disease)   . Hyperlipidemia   . Diabetes mellitus without mention of complication   . Hypertension   . Myocardial infarction   . Coronary artery disease   . Fatty liver disease, nonalcoholic   . Cholelithiasis   . Iron deficiency anemia   . Angina   . Shortness of breath   . Asthma   . Arthritis     HANDS"      Past Surgical History  Procedure Date  . Right colectomy     After colonic perforation  . Tubal ligation   . Coronary angioplasty with stent placement   . Esophagogastroduodenoscopy 11/30/2011    Procedure: ESOPHAGOGASTRODUODENOSCOPY (EGD);  Surgeon: Lafayette Dragon, MD;  Location: [facility];  Service: Endoscopy;  Laterality: N/A;  . Colonoscopy 11/30/2011    Procedure: COLONOSCOPY;  Surgeon: Lafayette Dragon, MD;  Location: [facility];  Service: Endoscopy;  Laterality: N/A;    Reason for consult : SOB, Abd pain .   HPI  [name]  is a 61 y.o. female, with PMH sig for COPD/Asthma complaining of SOB . No sig CP , Cough, fever or chills . No Nausea or vomiting . Complains of L lower Quadrant pain as well ,with H/O anemia . No HA , Dizziness or LOC .Pt is confused    Review of Systems    In addition to the HPI above,  No Fever-chills, No Headache, No changes with Vision or hearing, No problems swallowing food or Liquids, No Chest pain, Complains of SOB + Abdominal pain, No Nausea or Vommitting, Bowel movements are regular, No Blood in stool or Urine, No dysuria, No new skin rashes or bruises, No new joints pains-aches,  No  new weakness, tingling, numbness in any extremity, No recent weight gain or loss, No polyuria, polydypsia or polyphagia, +significant Mental Stressors.  A full 10 point Review of Systems was done, except as stated above, all other Review of Systems were negative.   Social History History  Substance Use Topics  . Smoking status: Former Smoker -- 0.5 packs/day for 47 years    Types: Cigarettes    Quit date: 05/09/2010  . Smokeless tobacco: Never Used  . Alcohol Use: No     Family History Family History  Problem Relation Age of Onset  . Heart disease Mother   . Diabetes Mother   . Colon cancer Neg Hx   . Cancer Father     unsure what kind  . Diabetes Sister      Prior to Admission medications   Medication Sig Start Date End Date Taking? Authorizing Provider  ALPRAZolam Duanne Moron) 0.25 MG tablet Take 0.25 mg  by mouth at bedtime as needed. For sleep    Historical Provider, MD  amLODipine (NORVASC) 5 MG tablet Take 5 mg by mouth daily.      Historical Provider, MD  carvedilol (COREG) 6.25 MG tablet Take 6.25 mg by mouth 2 (two) times daily with a meal.    Historical Provider, MD  famotidine (PEPCID) 20 MG tablet Take 20 mg by mouth 2 (two) times daily.    Historical Provider, MD  hydrOXYzine (ATARAX/VISTARIL) 25 MG tablet Take 25 mg by mouth 2 (two) times daily.    Historical Provider, MD  metFORMIN (GLUCOPHAGE) 500 MG tablet Take 500 mg by mouth 2 (two) times daily with a meal.    Historical Provider, MD  metoprolol succinate (TOPROL-XL) 50 MG 24 hr tablet Take 50 mg by mouth daily. Take with or immediately following a meal.    Historical Provider, MD  nitroGLYCERIN (NITROSTAT) 0.4 MG SL tablet Place 0.4 mg under the tongue every 5 (five) minutes as needed. For chest pain    Historical Provider, MD  sertraline (ZOLOFT) 25 MG tablet Take 25 mg by mouth daily.    Historical Provider, MD    No Known Allergies  Physical Exam  Vitals  Blood pressure 146/80, pulse 80, temperature  98.5 F (36.9 C), temperature source Oral, resp. rate 18, height 4' 7.51" (1.41 m), weight 60.328 kg (133 lb).   1. AAF lying in bed depressed  2. Flat affect , Not Suicidal or Homicidal, Awake Alert, Oriented X 2  3. No F.N deficits, ALL C.Nerves Intact, Strength 5/5 all 4 extremities, Sensation intact all 4 extremities, Plantars down going.  4. Ears and Eyes appear Normal, Conjunctivae clear, PERRLA. Moist Oral Mucosa.  5. Supple Neck, No JVD, No cervical lymphadenopathy appriciated, No Carotid Bruits.  6. Symmetrical Chest wall movement, Good air movement bilaterally, B/L wheezing and rhonchi.  7. RRR, No Gallops, Rubs or Murmurs, No Parasternal Heave.  8. Positive Bowel Sounds, Abdomen Soft, Mild llQ Tenderness, No organomegaly appriciated,No rebound -guarding or rigidity.  9.  No Cyanosis, Normal Skin Turgor, No Skin Rash or Bruise.  10. Good muscle tone,  joints appear normal , no effusions, Normal ROM.  11. No Palpable Lymph Nodes in Neck or Axillae    Data Review  CBC  Lab 01/31/12 2018  WBC 8.8  HGB 9.7*  HCT 33.7*  PLT 435*  MCV 78.7  MCH 22.7*  MCHC 28.8*  RDW 16.0*  LYMPHSABS --  MONOABS --  EOSABS --  BASOSABS --  BANDABS --   ------------------------------------------------------------------------------------------------------------------  Chemistries   Lab 01/31/12 2018  NA 137  K 3.9  CL 100  CO2 29  GLUCOSE 111*  BUN 10  CREATININE 0.93  CALCIUM 9.8  MG --  AST 12  ALT 12  ALKPHOS 139*  BILITOT 0.4   ------------------------------------------------------------------------------------------------------------------ estimated creatinine clearance is 44.2 ml/min (by C-G formula based on Cr of 0.93). ------------------------------------------------------------------------------------------------------------------ No results found for this basename: TSH,T4TOTAL,FREET3,T3FREE,THYROIDAB in the last 72 hours   Coagulation profile No  results found for this basename: INR:5,PROTIME:5 in the last 168 hours ------------------------------------------------------------------------------------------------------------------- No results found for this basename: DDIMER:2 in the last 72 hours -------------------------------------------------------------------------------------------------------------------  Cardiac Enzymes No results found for this basename: CK:3,CKMB:3,TROPONINI:3,MYOGLOBIN:3 in the last 168 hours ------------------------------------------------------------------------------------------------------------------ No components found with this basename: POCBNP:3   ---------------------------------------------------------------------------------------------------------------  Urinalysis    Component Value Date/Time   COLORURINE YELLOW 01/31/2012 1919   APPEARANCEUR CLEAR 01/31/2012 1919   LABSPEC 1.009 01/31/2012 Tony  6.0 01/31/2012 1919   GLUCOSEU NEGATIVE 01/31/2012 1919   HGBUR NEGATIVE 01/31/2012 1919   BILIRUBINUR NEGATIVE 01/31/2012 1919   KETONESUR NEGATIVE 01/31/2012 1919   PROTEINUR NEGATIVE 01/31/2012 1919   UROBILINOGEN 1.0 01/31/2012 1919   NITRITE NEGATIVE 01/31/2012 1919   LEUKOCYTESUR SMALL* 01/31/2012 1919    ----------------------------------------------------------------------------------------------------------------   Imaging results:   No results found.    Assessment & Plan  1.SOB with wheezing & a H/O Asthma . 2.Abdominal pain/ Chronic with H/O Anemia . 3.Depression  Plan : Check CXR , ABG's Check CT of abdomen Symbicort C/W Neb Rxs   1.       Time spent in minutes : 30 min  Condition Fair

## 2012-02-02 ENCOUNTER — Inpatient Hospital Stay (HOSPITAL_COMMUNITY): Payer: Medicaid Other

## 2012-02-02 DIAGNOSIS — Z634 Disappearance and death of family member: Secondary | ICD-10-CM

## 2012-02-02 DIAGNOSIS — I10 Essential (primary) hypertension: Secondary | ICD-10-CM

## 2012-02-02 DIAGNOSIS — D649 Anemia, unspecified: Secondary | ICD-10-CM

## 2012-02-02 DIAGNOSIS — R109 Unspecified abdominal pain: Secondary | ICD-10-CM

## 2012-02-02 DIAGNOSIS — F32A Depression, unspecified: Secondary | ICD-10-CM | POA: Diagnosis present

## 2012-02-02 DIAGNOSIS — R45851 Suicidal ideations: Secondary | ICD-10-CM

## 2012-02-02 DIAGNOSIS — F329 Major depressive disorder, single episode, unspecified: Principal | ICD-10-CM

## 2012-02-02 DIAGNOSIS — E119 Type 2 diabetes mellitus without complications: Secondary | ICD-10-CM

## 2012-02-02 LAB — GLUCOSE, CAPILLARY
Glucose-Capillary: 104 mg/dL — ABNORMAL HIGH (ref 70–99)
Glucose-Capillary: 134 mg/dL — ABNORMAL HIGH (ref 70–99)

## 2012-02-02 MED ORDER — POLYSACCHARIDE IRON COMPLEX 150 MG PO CAPS
150.0000 mg | ORAL_CAPSULE | Freq: Every day | ORAL | Status: DC
  Administered 2012-02-02 – 2012-02-06 (×5): 150 mg via ORAL
  Filled 2012-02-02 (×9): qty 1

## 2012-02-02 MED ORDER — ALBUTEROL SULFATE HFA 108 (90 BASE) MCG/ACT IN AERS: 1.0000 | INHALATION_SPRAY | Freq: Four times a day (QID) | RESPIRATORY_TRACT | Status: DC | PRN

## 2012-02-02 MED ORDER — METFORMIN HCL 500 MG PO TABS
500.0000 mg | ORAL_TABLET | Freq: Two times a day (BID) | ORAL | Status: DC
  Administered 2012-02-04 – 2012-02-06 (×4): 500 mg via ORAL
  Filled 2012-02-02 (×10): qty 1

## 2012-02-02 MED ORDER — HYDROXYZINE HCL 25 MG PO TABS: 25.0000 mg | ORAL_TABLET | Freq: Every day | ORAL | Status: DC

## 2012-02-02 MED ORDER — IOHEXOL 300 MG/ML  SOLN
100.0000 mL | Freq: Once | INTRAMUSCULAR | Status: AC | PRN
  Administered 2012-02-02: 100 mL via INTRAVENOUS
  Filled 2012-02-02: qty 100

## 2012-02-02 MED ORDER — ASPIRIN EC 81 MG PO TBEC
81.0000 mg | DELAYED_RELEASE_TABLET | Freq: Every day | ORAL | Status: DC
  Administered 2012-02-02 – 2012-02-06 (×5): 81 mg via ORAL
  Filled 2012-02-02 (×9): qty 1

## 2012-02-02 MED ORDER — INSULIN ASPART 100 UNIT/ML ~~LOC~~ SOLN
0.0000 [IU] | Freq: Three times a day (TID) | SUBCUTANEOUS | Status: DC
  Administered 2012-02-02 – 2012-02-03 (×2): 1 [IU] via SUBCUTANEOUS
  Administered 2012-02-04: 3 [IU] via SUBCUTANEOUS
  Administered 2012-02-04: 2 [IU] via SUBCUTANEOUS
  Administered 2012-02-05: 1 [IU] via SUBCUTANEOUS
  Administered 2012-02-05: 2 [IU] via SUBCUTANEOUS
  Filled 2012-02-02: qty 1

## 2012-02-02 NOTE — Progress Notes (Signed)
D:Pt is tearful talking about the loss of her son recently. Pt reported that she has a sister who is supportive and a grandson that she lives with. Pt reports that she has outside assistance with ADLs. A:Pt assisted to contact her sister who lives out of town and to give security code number. Supported pt to discuss feelings. Offered 1:1 monitoring. R:Pt denies si and hi. Pt scheduled to go to [facility] for CT and x ray at 1300. Safety maintained on the unit.

## 2012-02-02 NOTE — BHH Suicide Risk Assessment (Signed)
Suicide Risk Assessment  Admission Assessment     Demographic factors:    Current Mental Status:    Loss Factors:    Historical Factors:    Risk Reduction Factors:     CLINICAL FACTORS:   Depression:   Anhedonia Hopelessness Insomnia  COGNITIVE FEATURES THAT CONTRIBUTE TO RISK:  Thought constriction (tunnel vision)    SUICIDE RISK:   Minimal: No identifiable suicidal ideation.  Patients presenting with no risk factors but with morbid ruminations; may be classified as minimal risk based on the severity of the depressive symptoms  PLAN OF CARE: 1.   Pt is grieving for the loss of two youngest sons.  She will participate in group sessions and talk with social worker - She is worried about paying her rent. 2.  Pt will report any side effects.  3  Pt denies SI but will report suicidal thoughts at least two days before discharge.  Donnisha Besecker 02/02/2012, 5:22 PM

## 2012-02-02 NOTE — Progress Notes (Signed)
Pt sent to [facility] with sitter to receive CT scan, chest xray and ABG. Contrast given at 10 and 11 per order from radiology. Per call from [facility] in respiratory pt's restults on room air ph 7.42, CO2 36.9, PO2 70.5, Bicarb 23.7, T HGB 10.3. Respiratory in process of putting results in EPIC.

## 2012-02-02 NOTE — Clinical Social Work Psychosocial (Signed)
[facility] Group Notes: (Clinical Social Work)   02/02/2012      Type of Therapy:  Group Therapy   Participation Level:  Did Not Attend    Selmer Dominion, LCSW 02/02/2012, 4:48 PM

## 2012-02-02 NOTE — Progress Notes (Signed)
Late entry 1:1 nursing note- Patient currently lying in bed asleep eyes closed, respirations even and unlabored, 1:1 continues patient is safe. Patient received a prn dose of visteril to aid with sleep which she requested and 2 tylenol for rt. leg pain she rated a 4. Will monitor effectiveness of medication. Will continue to monitor.

## 2012-02-02 NOTE — Progress Notes (Signed)
Received call from lab that stool sample card was out of date and new sample requested. Per Dr. Fay Records do not reorder sample.

## 2012-02-02 NOTE — Progress Notes (Addendum)
[facility] PROGRESS NOTE  [name] [id number] DOB: 1950-02-11 DOA: 02/01/2012 PCP: Elyn Peers, MD  Assessment/Plan: Shortness of breath with wheezing ABG reviewed. Resolved, likely from asthma?  Patient reports that she has smoked tobacco in the past, uncertain if she has a component of COPD.  May benefit from outpatient pulmonary function test to be arranged by primary care physician.  Patient started on Symbicort 2 puffs BID (can be discharged on this medication). Continue PRN nebs treatments.  Will also start PRN albuterol inhaler(can be discharged on this medication).  Chest x-ray shows low lung volumes, no acute cardiopulmonary findings.  Abdominal pain Currently denying any abdominal pain.  CT of abdomen and pelvis with contrast showed no acute findings, it showed cholelithiasis without evidence for cholecystitis.  May benefit from following up with her primary care physician and being referred to general surgery for consideration of cholecystectomy.  History of chest pain Currently denies any chest pain.  Patient had a stress test by Dr. Terrence Dupont in October of 2013 which showed no signs of ischemia.  Anemia Anemia panel on 10/09/2011 suggestive of iron deficiency anemia start supplemental iron.  Patient also gets monthly B12 injections, by Dr. Olevia Perches, GI.  Hemoglobin on admission was 9.7.  Hemoglobin on 11/21/2011 was 9.6.  Unless patient has melanotic stool or black stools, stool occult does not need to be checked at this time, but defer to primary service.  Depression/anxiety Management as per primary service.  Coronary artery disease, history of PTCA stenting to right coronary artery and left circumflex in the past Stable.  Continue metoprolol.  Uncertain why the patient is not on an aspirin, was on discharge medication on 11/22/2011.  Will restart aspirin 81 mg daily.  Her medication list suggests patient is on carvedilol, however this medication was not on her  discharge medication list on 11/22/2011 do not see the need for two beta blockers at this time, continue to hold carvedilol.   Type 2 diabetes Last available hemoglobin A1c in 11/20/2011 was 6.8.  Continue metformin, which will be resumed on 02/04/2012 as patient received IV contrast.  Will have the patient on sensitive sliding scale insulin while at behavioral health.  GERD Continue famotidine.  Hypertension Stable.  Continue amlodipine and metoprolol.    Hyperlipidemia LDL on 11/21/2011 was 36.  Currently no longer on atorvastatin, may have been weaned off this medication.  Consider checking lipid panel in 3 months as per primary care physician and if LDL greater than 70 consider restarting statin.  History of tobacco use Commended on smoking cessation.  Patient reports that she has stopped smoking at least over a year ago.  Thank you for the consult.  As patient's medical conditions are stable at this time will sign off.  Please do not hesitate to contact us with any questions or concerns.   HPI/Subjective: Currently denies any chest pain or shortness of breath.  No other specific complaints.  Does admit to being depressed denies any suicidal thoughts or ideation.  Objective: Filed Vitals:   02/01/12 1536 02/01/12 1537 02/02/12 0700  BP: 154/74 146/80 149/81  Pulse: 87 80 74  Temp: 98.5 F (36.9 C)  98.2 F (36.8 C)  TempSrc: Oral    Resp: 18  20  Height: 4' 7.51" (1.41 m)    Weight: 60.328 kg (133 lb)     No intake or output data in the 24 hours ending 02/02/12 1509 Filed Weights   02/01/12 1536  Weight: 60.328 kg (133 lb)  Exam: Physical Exam: General: Awake, Oriented, No acute distress. HEENT: EOMI. Neck: Supple CV: S1 and S2 Lungs: Clear to ascultation bilaterally Abdomen: Soft, Nontender, Nondistended, +bowel sounds. Ext: Good pulses. Trace edema.  Data Reviewed: Basic Metabolic Panel:  Lab [id number] 2018  NA 137  K 3.9  CL 100  CO2 29  GLUCOSE  111*  BUN 10  CREATININE 0.93  CALCIUM 9.8  MG --  PHOS --   Liver Function Tests:  Lab 01/31/12 2018  AST 12  ALT 12  ALKPHOS 139*  BILITOT 0.4  PROT 8.8*  ALBUMIN 3.5   No results found for this basename: LIPASE:5,AMYLASE:5 in the last 168 hours No results found for this basename: AMMONIA:5 in the last 168 hours CBC:  Lab 01/31/12 2018  WBC 8.8  NEUTROABS --  HGB 9.7*  HCT 33.7*  MCV 78.7  PLT 435*   Cardiac Enzymes: No results found for this basename: CKTOTAL:5,CKMB:5,CKMBINDEX:5,TROPONINI:5 in the last 168 hours BNP (last 3 results)  Basename 03/19/11 1619  PROBNP 23.5   CBG:  Lab 02/02/12 1155 02/02/12 0622 02/01/12 1740 02/01/12 0721 01/31/12 1910  GLUCAP 104* 109* 104* 114* 91    No results found for this or any previous visit (from the past 240 hour(s)).   Studies: Ct Abdomen Pelvis W Contrast  02/02/2012  *RADIOLOGY REPORT*  Clinical Data: Abdominal pain.  Anemia.  Mild leukocytosis. Previous right colectomy for perforation.  CT ABDOMEN AND PELVIS WITH CONTRAST  Technique:  Multidetector CT imaging of the abdomen and pelvis was performed following the standard protocol during bolus administration of intravenous contrast.  Contrast: 157mL OMNIPAQUE IOHEXOL 300 MG/ML  SOLN  Comparison: 03/25/2011  Findings: Tiny less than 1 cm gallstones again demonstrated, however there is no evidence of cholecystitis.  Mild hepatic steatosis again demonstrated, without evidence of liver mass or biliary dilatation.  The pancreas, spleen, adrenal glands, and left kidney are normal appearance.  Chronic right renal parenchymal scarring and atrophy is stable.  No evidence of renal mass or hydronephrosis.  No soft tissue masses or lymphadenopathy is seen elsewhere within the abdomen or pelvis.  Uterus and adnexal regions are unremarkable.  No evidence of inflammatory process or abnormal fluid collections.  No evidence of bowel wall thickening, dilatation, or hernia.  No suspicious  bone lesions identified.  IMPRESSION:  1.  No acute findings. 2.  Cholelithiasis, without evidence of cholecystitis. 3.  Mild hepatic steatosis. 4.  Chronic right renal parenchymal scarring and atrophy.   Original Report Authenticated By: Earle Gell, M.D.     Scheduled Meds:   . amLODipine  5 mg Oral Daily  . budesonide-formoterol  2 puff Inhalation BID  . famotidine  20 mg Oral BID  . metFORMIN  500 mg Oral BID WC  . metoprolol succinate  50 mg Oral Daily  . sertraline  75 mg Oral Daily   Continuous Infusions:   Principal Problem:  *Suicidal ideation Active Problems:  Anemia  SOB (shortness of breath)  Abdominal  pain, other specified site  Bereavement  Depression    Time spent: 35 mins    [facility] Pager [phone]. If 8PM-8AM, please contact night-coverage at [email or web address], password [facility] 02/02/2012, 3:09 PM  LOS: 1 day

## 2012-02-02 NOTE — Progress Notes (Signed)
D - Patient sleeping in bed without signs or symptoms of distress. Patients breathing even and unlabored, no signs of shortness of breathe at this time.  A - Patient remains of 1:1 observation to maintain patient safety and assist with patients ADL's. R - Patient remains safe at this time.

## 2012-02-03 ENCOUNTER — Inpatient Hospital Stay (HOSPITAL_COMMUNITY): Payer: Medicaid Other

## 2012-02-03 ENCOUNTER — Encounter (HOSPITAL_COMMUNITY): Payer: Self-pay | Admitting: *Deleted

## 2012-02-03 LAB — CBC
Hemoglobin: 9.9 g/dL — ABNORMAL LOW (ref 12.0–15.0)
MCH: 23.2 pg — ABNORMAL LOW (ref 26.0–34.0)
MCHC: 30.1 g/dL (ref 30.0–36.0)
Platelets: 468 10*3/uL — ABNORMAL HIGH (ref 150–400)
RDW: 16.2 % — ABNORMAL HIGH (ref 11.5–15.5)

## 2012-02-03 LAB — BASIC METABOLIC PANEL
Calcium: 9.8 mg/dL (ref 8.4–10.5)
GFR calc non Af Amer: 70 mL/min — ABNORMAL LOW (ref >90)
Glucose, Bld: 154 mg/dL — ABNORMAL HIGH (ref 70–99)
Sodium: 139 mEq/L (ref 135–145)

## 2012-02-03 LAB — POCT I-STAT, CHEM 8
Glucose, Bld: 125 mg/dL — ABNORMAL HIGH (ref 70–99)
HCT: 34 % — ABNORMAL LOW (ref 36.0–46.0)
Hemoglobin: 11.6 g/dL — ABNORMAL LOW (ref 12.0–15.0)
Potassium: 3.8 mEq/L (ref 3.5–5.1)

## 2012-02-03 LAB — GLUCOSE, CAPILLARY
Glucose-Capillary: 113 mg/dL — ABNORMAL HIGH (ref 70–99)
Glucose-Capillary: 127 mg/dL — ABNORMAL HIGH (ref 70–99)

## 2012-02-03 LAB — TROPONIN I: Troponin I: <0.3 ng/mL (ref <0.30)

## 2012-02-03 MED ORDER — ASPIRIN 81 MG PO CHEW
324.0000 mg | CHEWABLE_TABLET | Freq: Once | ORAL | Status: AC
  Administered 2012-02-03: 324 mg via ORAL
  Filled 2012-02-03: qty 4

## 2012-02-03 MED ORDER — NITROGLYCERIN 0.4 MG SL SUBL
0.4000 mg | SUBLINGUAL_TABLET | SUBLINGUAL | Status: DC | PRN
  Administered 2012-02-03: 16:00:00 via SUBLINGUAL
  Administered 2012-02-03 (×2): 0.4 mg via SUBLINGUAL
  Filled 2012-02-03: qty 25

## 2012-02-03 MED ORDER — NITROGLYCERIN 0.4 MG SL SUBL
0.4000 mg | SUBLINGUAL_TABLET | SUBLINGUAL | Status: AC | PRN
  Administered 2012-02-03 (×3): 0.4 mg via SUBLINGUAL

## 2012-02-03 MED ORDER — NITROGLYCERIN 0.4 MG SL SUBL
SUBLINGUAL_TABLET | SUBLINGUAL | Status: AC
  Filled 2012-02-03: qty 25

## 2012-02-03 NOTE — Progress Notes (Signed)
Pt picked up by EMS for transfer to ED.

## 2012-02-03 NOTE — Progress Notes (Signed)
Psychoeducational Group Note  Date:  02/03/2012 Time:  0945  Group Topic/Focus:  Self Inventory  Participation Level:  None  Participation Quality:  Inattentive  Affect:  Flat  Cognitive:  Lacking  Insight:  None  Engagement in Group:  None  Additional Comments:    Marce Schartz Leanna Sato 02/03/2012, 10:43 AM

## 2012-02-03 NOTE — Clinical Social Work Psychosocial (Signed)
[facility] Group Notes: (Clinical Social Work)   02/03/2012      Type of Therapy:  Group Therapy   Participation Level:  Did Not Attend    Selmer Dominion, LCSW 02/03/2012, 4:10 PM

## 2012-02-03 NOTE — Progress Notes (Signed)
Psychoeducational Group Note  Date:  02/03/2012 Time:  2130  Group Topic/Focus:  wrap up group  Participation Level: Did Not Attend  Participation Quality:  Not Applicable  Affect:  Not Applicable  Cognitive:  Not Applicable  Insight:  Not Applicable  Engagement in Group: Not Applicable  Additional Comments:  Patient remained in her room in bed during group time.  [name] 02/03/2012, 1:46 AM

## 2012-02-03 NOTE — BHH Counselor (Signed)
Adult Comprehensive Assessment  Patient ID: [name], female   DOB: 09/27/1950, 61 y.o.   MRN: [id number]  Information Source: Information source: Patient  Current Stressors:  Educational / Learning stressors: Denies Employment / Job issues: Denies Family Relationships: Denies Museum/gallery curator / Lack of resources (include bankruptcy): Sometimes, "the electric bill stays so high and I don't have enough money to pay it." Housing / Lack of housing: Denies Physical health (include injuries & life threatening diseases): Denies Social relationships: Denies Substance abuse: Denies Bereavement / Loss: Loss of her children, brother, sister  Living/Environment/Situation:  Living Arrangements: Other (Comment) (Grandson) Living conditions (as described by patient or guardian): Nice and safe How long has patient lived in current situation?: 19 years What is atmosphere in current home: Supportive;Loving;Comfortable  Family History:  Marital status: Divorced Divorced, when?: A long time ago What types of issues is patient dealing with in the relationship?: N/A Does patient have children?: Yes How many children?: 4  (1 living, 3 deceased) How is patient's relationship with their children?: He is in [place], calls every day to see if she is taking her medication  Childhood History:  By whom was/is the patient raised?: Both parents Description of patient's relationship with caregiver when they were a child: Good Patient's description of current relationship with people who raised him/her: Both deceased Does patient have siblings?: Yes Number of Siblings: 9  (1 sister deceased, 1 sister deceased, 69 still living) Description of patient's current relationship with siblings: Good Did patient suffer any verbal/emotional/physical/sexual abuse as a child?: No Did patient suffer from severe childhood neglect?: No Has patient ever been sexually abused/assaulted/raped as an adolescent or adult?:  No Was the patient ever a victim of a crime or a disaster?: No Witnessed domestic violence?: No Has patient been effected by domestic violence as an adult?: No  Education:  Highest grade of school patient has completed: 37 Currently a Ship broker?: No Learning disability?: No  Employment/Work Situation:   Employment situation: On disability (Retired) Why is patient on disability: "Not able to work" - does not want to provide more information than this How long has patient been on disability: 5 years What is the longest time patient has a held a job?: 8 years Where was the patient employed at that time?: [employer] Has patient ever been in the [employer]?: No Has patient ever served in Recruitment consultant?: No  Financial Resources:   Museum/gallery curator resources: [employer];Medicaid;Food stamps Does patient have a representative payee or guardian?: No  Alcohol/Substance Abuse:   What has been your use of drugs/alcohol within the last 12 months?: Denies Alcohol/Substance Abuse Treatment Hx: Denies past history Has alcohol/substance abuse ever caused legal problems?: No  Social Support System:   Heritage manager System: Good Describe Community Support System: Lives in a elderly home, neighbors watch out for each other, son who calls daily, grandson who lives with her Type of faith/religion: Baptist How does patient's faith help to cope with current illness?: Unclear  Leisure/Recreation:   Leisure and Hobbies: Tries to do things around the house, puzzle books, cook  Strengths/Needs:   What things does the patient do well?: Decorating things, cook, grandmother, mother In what areas does patient struggle / problems for patient: Has an aide to come 5 days weekly to help with physical chores, struggles with the death of her children  Discharge Plan:   Does patient have access to transportation?: Yes Will patient be returning to same living situation after discharge?: Yes Currently receiving  community  mental health services: No If no, would patient like referral for services when discharged?: No Does patient have financial barriers related to discharge medications?: No  Summary/Recommendations:    This is a 61 y.o. African-American female hospitalized after endorsing suicidal thoughts and "just not wanting to be on this Earth anymore."  She had four children, and two have previously died; her third child died in his sleep on Jan 21, 2012.  Pt voices apathy and despair over her losses and has decompensated significantly since his death. Pt admits not eating or drinking as she should, especially with her medical issues, and having decreased sleep "I can't sleep unless I take medication."  She denies any previous psychiatric history and declines referral for services at discharge.  She has a 53yo grandson living with her, and an aide who comes in to help her 5 days a week.  Her remaining son calls daily from [place].  She would benefit from safety monitoring, medication evaluation, psychoeducation, group therapy, and discharge planning to link with ongoing resources.    Lysle Dingwall. 02/03/2012

## 2012-02-03 NOTE — ED Notes (Signed)
Per EMS pt from [facility] for depression/SI, started having chest pain around 1500 today, was given 3 nitro by [facility] staff, pt complaining of mid-sternal chest pain, tenderness on palpation, denies any other symptoms, pain 2/10, 12-lead sinus, BP 160/70, HR 88, RR 18, lungs clear, cxray and abdominal xray done yesterday was negative. [place]

## 2012-02-03 NOTE — Progress Notes (Signed)
Patient currently lying in bed asleep, eyes closed respirations even and unlabored, no distress noted, 1:1 continues and patient is safe.

## 2012-02-03 NOTE — Progress Notes (Signed)
Patient lying in bed asleep eyes closed and respirations even and unlabored, no distress noted, 1:1 continues. Patient is safe and 1:1 continues.

## 2012-02-03 NOTE — Progress Notes (Signed)
Patient with complaints of mid sternal chest pain not radiating patient rated 5/10. Aggie NP notified new orders received. NTG SL given every 5 minutes x 3, Maalox given and Ativan 1 mg po given with no relief from chest pain. Patient describes chest pain as mid sternal and upper epi gastric with pressure. Patients VS remained stable with NTG SL. EMS notified, plan to transport patient to ED.

## 2012-02-03 NOTE — Progress Notes (Addendum)
RN 1:1 Note  D: Pt is awake in bed this AM. Pt has a sitter present within arms reach. Pt mood is depressed and her affect is flat but she does brighten with conversation. Pt denies SI/HI and AVH at this time and states that she never was suicidal. Pt is dependent on a wheel chair, but is using a cane to ambulate to the bathroom.   A: Writer encouraged pt to attend groups and travel to the cafeteria for meals. Writer also spoke with pt's sister, with the pt's permission, and provided updates on her status. Pt has not been tearful today and appears to be comfortable without complaints.  R: Pt has been getting up periodically for groups and meals. 1:1 observation is ongoing. Writer will continue to monitor.

## 2012-02-03 NOTE — Progress Notes (Signed)
1:1 RN Note  D: Pt is awake in bed this afternoon working in crossword puzzles. Pt is in no distress at this time. Pt mood is depressed/appropriate and her affect is flat/sad, but she is conversational and cooperative with staff.   A: Pt did go to the cafeteria for lunch without difficulty and is able to ambulate to the bathroom with a cane as needed. Writer discussed her sister's concerns about her wellbeing and provided fluids to encourage hydration.  R: Pt environment is free of hazards and 1:1 observation by MHT is ongoing. Writer will continue to monitor.

## 2012-02-03 NOTE — ED Provider Notes (Signed)
History     CSN: [id number]  Arrival date & time 02/03/12  1810   First MD Initiated Contact with Patient 02/03/12 2016      Chief Complaint  Patient presents with  . Chest Pain    (Consider location/radiation/quality/duration/timing/severity/associated sxs/prior treatment) HPI History provided by pt and prior chart.   Pt w/ h/o MI, s/p PCI w/ stents in 2008, as well as COPD sent from [facility], where she is being treated for depression and SI, for substernal CP since 2pm.  Started while sitting down, doing a puzzles.  Describes pain as intermittent, non-exertional, non-radiating pressure w/out associated sx.  Has daily angina for which she takes ntg at home, and pain today feels similar, w/ exception that it is at rest.  Had some relief of pain w/ ntg and it is currently 1/10 on pain scale.  Has had SOB in supine position but this is stable.  No recent fever, cough, wheezing, abdominal pain, N/V.  Per prior chart, pt had a non-ischemic stress test in 11/2011.    Past Medical History  Diagnosis Date  . History of colonic polyps   . GERD (gastroesophageal reflux disease)   . Hyperlipidemia   . Diabetes mellitus without mention of complication   . Hypertension   . Myocardial infarction   . Coronary artery disease   . Fatty liver disease, nonalcoholic   . Cholelithiasis   . Iron deficiency anemia   . Angina   . Shortness of breath   . Asthma   . Arthritis     HANDS"    Past Surgical History  Procedure Date  . Right colectomy     After colonic perforation  . Tubal ligation   . Coronary angioplasty with stent placement   . Esophagogastroduodenoscopy 11/30/2011    Procedure: ESOPHAGOGASTRODUODENOSCOPY (EGD);  Surgeon: Lafayette Dragon, MD;  Location: [facility];  Service: Endoscopy;  Laterality: N/A;  . Colonoscopy 11/30/2011    Procedure: COLONOSCOPY;  Surgeon: Lafayette Dragon, MD;  Location: [facility];  Service: Endoscopy;  Laterality: N/A;    Family History  Problem Relation  Age of Onset  . Heart disease Mother   . Diabetes Mother   . Colon cancer Neg Hx   . Cancer Father     unsure what kind  . Diabetes Sister     History  Substance Use Topics  . Smoking status: Former Smoker -- 0.5 packs/day for 47 years    Types: Cigarettes    Quit date: 05/09/2010  . Smokeless tobacco: Never Used  . Alcohol Use: No    OB History    Grav Para Term Preterm Abortions TAB SAB Ect Mult Living                  Review of Systems  All other systems reviewed and are negative.    Allergies  Review of patient's allergies indicates no known allergies.  Home Medications   Current Outpatient Rx  Name  Route  Sig  Dispense  Refill  . ALPRAZOLAM 0.25 MG PO TABS   Oral   Take 0.25 mg by mouth at bedtime as needed. For sleep         . AMLODIPINE BESYLATE 5 MG PO TABS   Oral   Take 5 mg by mouth daily.           Marland Kitchen CARVEDILOL 6.25 MG PO TABS   Oral   Take 6.25 mg by mouth 2 (two) times daily with a meal.         .  FAMOTIDINE 20 MG PO TABS   Oral   Take 20 mg by mouth 2 (two) times daily.         Marland Kitchen HYDROXYZINE HCL 25 MG PO TABS   Oral   Take 25 mg by mouth 2 (two) times daily.         Marland Kitchen METFORMIN HCL 500 MG PO TABS   Oral   Take 500 mg by mouth 2 (two) times daily with a meal.         . METOPROLOL SUCCINATE ER 50 MG PO TB24   Oral   Take 50 mg by mouth daily. Take with or immediately following a meal.         . NITROGLYCERIN 0.4 MG SL SUBL   Sublingual   Place 0.4 mg under the tongue every 5 (five) minutes as needed. For chest pain         . SERTRALINE HCL 25 MG PO TABS   Oral   Take 25 mg by mouth daily.           BP 136/65  Pulse 85  Temp 98.4 F (36.9 C) (Oral)  Resp 20  Ht 4' 7.51" (1.41 m)  Wt 133 lb (60.328 kg)  BMI 30.34 kg/m2  SpO2 95%  Physical Exam  Nursing note and vitals reviewed. Constitutional: She is oriented to person, place, and time. She appears well-developed and well-nourished. No distress.  HENT:   Head: Normocephalic and atraumatic.  Eyes:       Normal appearance  Neck: Normal range of motion.  Cardiovascular: Normal rate and regular rhythm.   Pulmonary/Chest: Effort normal and breath sounds normal. No respiratory distress.       Mild tenderness LSB  Abdominal: Soft. Bowel sounds are normal. She exhibits no distension. There is no tenderness.  Musculoskeletal: Normal range of motion.       No LE edema or ttp  Neurological: She is alert and oriented to person, place, and time.  Skin: Skin is warm and dry. No rash noted.  Psychiatric: She has a normal mood and affect. Her behavior is normal.    ED Course  Procedures (including critical care time)   Date: 02/03/2012  Rate: 67  Rhythm: normal sinus rhythm  QRS Axis: normal  Intervals: normal  ST/T Wave abnormalities: nonspecific T wave changes  Conduction Disutrbances:none  Narrative Interpretation:   Old EKG Reviewed: changes noted (inverted/flattened t waves anterolaterally)   Labs Reviewed  GLUCOSE, CAPILLARY - Abnormal; Notable for the following:    Glucose-Capillary 104 (*)     All other components within normal limits  GLUCOSE, CAPILLARY - Abnormal; Notable for the following:    Glucose-Capillary 109 (*)     All other components within normal limits  GLUCOSE, CAPILLARY - Abnormal; Notable for the following:    Glucose-Capillary 104 (*)     All other components within normal limits  GLUCOSE, CAPILLARY - Abnormal; Notable for the following:    Glucose-Capillary 142 (*)     All other components within normal limits  GLUCOSE, CAPILLARY - Abnormal; Notable for the following:    Glucose-Capillary 134 (*)     All other components within normal limits  GLUCOSE, CAPILLARY - Abnormal; Notable for the following:    Glucose-Capillary 113 (*)     All other components within normal limits  GLUCOSE, CAPILLARY - Abnormal; Notable for the following:    Glucose-Capillary 127 (*)     All other components within normal limits   GLUCOSE, CAPILLARY -  Abnormal; Notable for the following:    Glucose-Capillary 126 (*)     All other components within normal limits  CBC - Abnormal; Notable for the following:    Hemoglobin 9.9 (*)     HCT 32.9 (*)     MCV 77.2 (*)     MCH 23.2 (*)     RDW 16.2 (*)     Platelets 468 (*)     All other components within normal limits  BASIC METABOLIC PANEL - Abnormal; Notable for the following:    Glucose, Bld 154 (*)     GFR calc non Af Amer 70 (*)     GFR calc Af Amer 82 (*)     All other components within normal limits  POCT I-STAT, CHEM 8 - Abnormal; Notable for the following:    Glucose, Bld 125 (*)     Hemoglobin 11.6 (*)     HCT 34.0 (*)     All other components within normal limits  OCCULT BLOOD X 1 CARD TO LAB, STOOL   Dg Chest 1 View  02/03/2012  *RADIOLOGY REPORT*  Clinical Data: Chest pain.  CHEST - 1 VIEW  Comparison: 02/02/2012  Findings: Heart and mediastinal contours are within normal limits. No focal opacities or effusions.  No acute bony abnormality.  IMPRESSION: No active cardiopulmonary disease.   Original Report Authenticated By: Rolm Baptise, M.D.    Dg Chest 1 View  02/02/2012  *RADIOLOGY REPORT*  Clinical Data: Short of breath  CHEST - 1 VIEW  Comparison: Chest radiograph 11/20/2011  Findings: Normal mediastinum and heart silhouette.  There are low lung volumes.  No effusion, infiltrate, or pneumothorax.  IMPRESSION: Low lung volumes.  No acute cardiopulmonary findings.   Original Report Authenticated By: Suzy Bouchard, M.D.    Ct Abdomen Pelvis W Contrast  02/02/2012  *RADIOLOGY REPORT*  Clinical Data: Abdominal pain.  Anemia.  Mild leukocytosis. Previous right colectomy for perforation.  CT ABDOMEN AND PELVIS WITH CONTRAST  Technique:  Multidetector CT imaging of the abdomen and pelvis was performed following the standard protocol during bolus administration of intravenous contrast.  Contrast: 195mL OMNIPAQUE IOHEXOL 300 MG/ML  SOLN  Comparison: 03/25/2011   Findings: Tiny less than 1 cm gallstones again demonstrated, however there is no evidence of cholecystitis.  Mild hepatic steatosis again demonstrated, without evidence of liver mass or biliary dilatation.  The pancreas, spleen, adrenal glands, and left kidney are normal appearance.  Chronic right renal parenchymal scarring and atrophy is stable.  No evidence of renal mass or hydronephrosis.  No soft tissue masses or lymphadenopathy is seen elsewhere within the abdomen or pelvis.  Uterus and adnexal regions are unremarkable.  No evidence of inflammatory process or abnormal fluid collections.  No evidence of bowel wall thickening, dilatation, or hernia.  No suspicious bone lesions identified.  IMPRESSION:  1.  No acute findings. 2.  Cholelithiasis, without evidence of cholecystitis. 3.  Mild hepatic steatosis. 4.  Chronic right renal parenchymal scarring and atrophy.   Original Report Authenticated By: Earle Gell, M.D.      1. Anemia   2. Diabetes mellitus   3. DOE (dyspnea on exertion)   4. Abdominal  pain, other specified site   5. Unspecified essential hypertension   6. Bereavement   7. Depression   8. Suicidal ideation       MDM  61yo F w/ h/o CAD, s/p angioplasty w/ stent to L circumflex in 2008, presents w/ CP that is concerning for unstable angina.  Had a non-ischemic stress test in 11/2011.  T wave changes on EKG today.  Troponin neg.  Pt has received aspirin and 2 doses of SL ntg so far and pain currently 1/10.  Dr. Terrence Dupont consulted, he has reviewed the EKG and recommends second troponin and discharge back to [facility] if negative.  He will f/u with her on an outpatient basis when she gets out.    Second troponin neg.  Results discussed w/ pt.  She is currently CP free and VSS.  Discharged back to [facility].           Remer Macho, PA-C 02/04/12 [phone]

## 2012-02-03 NOTE — ED Notes (Signed)
[id number] Expected date:02/03/12<BR> Expected time: 5:35 PM<BR> Means of arrival:Ambulance<BR> Comments:<BR> Chest pain from [facility]

## 2012-02-03 NOTE — Progress Notes (Signed)
[facility] MD Progress Note  02/03/2012 1:17 PM [name]  MRN:  [id number] Subjective:   Brief superficial answers. Says she has a health tech at home and her 63 yo grandson helps her with her ADL's as well.  Poor insight as to why she is here.  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   Eyes: Negative.   Respiratory: Negative.   Gastrointestinal: Negative.   Genitourinary: Negative.   Musculoskeletal: Positive for myalgias.  Skin: Negative.   Neurological: Positive for weakness.  Endo/Heme/Allergies: Negative.   Psychiatric/Behavioral: Positive for depression. The patient is nervous/anxious.     Blood pressure 135/82, pulse 74, temperature 98.2 F (36.8 C), temperature source Oral, resp. rate 18, height 4' 7.51" (1.41 m), weight 60.328 kg (133 lb).Body mass index is 30.34 kg/(m^2).  General Appearance: Fairly Groomed  Engineer, water::  Minimal  Speech:  Clear and Coherent and Slow  Volume:  Decreased  Mood:  Angry, Anxious and Depressed  Affect:  Congruent  Thought Process:  Clear rational goal oriented - go home   Orientation:  Full (Time, Place, and Person)  Thought Content:  Rumination about 2 sons deaths  Suicidal Thoughts:  No  Homicidal Thoughts:  No  Memory:  Can't eval doesn't answer enough   Judgement:  Impaired  Insight:  Shallow  Psychomotor Activity:  Decreased and Psychomotor Retardation  Concentration:  Fair  Recall:  Fair  Akathisia:  No  Handed:  Right  AIMS (if indicated):     Assets:  Financial Resources/Insurance Resilience Social Support  Sleep:  Number of Hours: 6.75    Current Medications: Current Facility-Administered Medications  Medication Dose Route Frequency  Provider Last Rate Last Dose  . acetaminophen (TYLENOL) tablet 650 mg  650 mg Oral Q6H PRN Shuvon Rankin, NP   650 mg at 02/03/12 0821  . albuterol (PROVENTIL HFA;VENTOLIN HFA) 108 (90 BASE) MCG/ACT inhaler 1-2 puff  1-2 puff Inhalation Q6H PRN Bynum Bellows, MD      . albuterol (PROVENTIL) (5 MG/ML) 0.5% nebulizer solution 2.5 mg  2.5 mg Nebulization Q6H PRN Shuvon Rankin, NP      . alum & mag hydroxide-simeth (MAALOX/MYLANTA) 200-200-20 MG/5ML suspension 30 mL  30 mL Oral Q4H PRN Shuvon Rankin, NP      . amLODipine (NORVASC) tablet 5 mg  5 mg Oral Daily Shuvon Rankin, NP   5 mg at 02/03/12 0818  . aspirin EC tablet 81 mg  81 mg Oral Daily Bynum Bellows, MD   81 mg at 02/03/12 0817  . budesonide-formoterol (SYMBICORT) 160-4.5 MCG/ACT inhaler 2 puff  2 puff Inhalation BID Merton Border, MD   2 puff at 02/03/12 0818  . famotidine (PEPCID) tablet 20 mg  20 mg Oral BID Shuvon Rankin, NP   20 mg at 02/03/12 0818  . hydrOXYzine (ATARAX/VISTARIL) tablet 50 mg  50 mg Oral QHS PRN Shuvon Rankin, NP   50 mg at 02/02/12 2109  . insulin aspart (novoLOG) injection 0-9 Units  0-9 Units Subcutaneous TID WC Bynum Bellows, MD   1 Units at 02/03/12 1200  . iron polysaccharides (NIFEREX) capsule 150 mg  150 mg Oral Daily Bynum Bellows, MD   150 mg at 02/03/12 0818  .  LORazepam (ATIVAN) tablet 1 mg  1 mg Oral Q8H PRN Shuvon Rankin, NP      . magnesium hydroxide (MILK OF MAGNESIA) suspension 30 mL  30 mL Oral Daily PRN Shuvon Rankin, NP      . metFORMIN (GLUCOPHAGE) tablet 500 mg  500 mg Oral BID WC Lerae Langham, MD      . metoprolol succinate (TOPROL-XL) 24 hr tablet 50 mg  50 mg Oral Daily Shuvon Rankin, NP   50 mg at 02/03/12 0818  . sertraline (ZOLOFT) tablet 75 mg  75 mg Oral Daily Shuvon Rankin, NP   75 mg at 02/03/12 [id number]    Lab Results:  Results for orders placed during the hospital encounter of 02/01/12 (from the past 48 hour(s))  GLUCOSE, CAPILLARY     Status: Abnormal   Collection Time   02/01/12   5:40 PM      Component Value Range Comment   Glucose-Capillary 104 (*) 70 - 99 mg/dL   GLUCOSE, CAPILLARY     Status: Abnormal   Collection Time   02/02/12  6:22 AM      Component Value Range Comment   Glucose-Capillary 109 (*) 70 - 99 mg/dL    Comment 1 Notify RN     GLUCOSE, CAPILLARY     Status: Abnormal   Collection Time   02/02/12 11:55 AM      Component Value Range Comment   Glucose-Capillary 104 (*) 70 - 99 mg/dL   GLUCOSE, CAPILLARY     Status: Abnormal   Collection Time   02/02/12  4:58 PM      Component Value Range Comment   Glucose-Capillary 142 (*) 70 - 99 mg/dL    Comment 1 Documented in Chart      Comment 2 Notify RN     GLUCOSE, CAPILLARY     Status: Abnormal   Collection Time   02/02/12  8:42 PM      Component Value Range Comment   Glucose-Capillary 134 (*) 70 - 99 mg/dL   GLUCOSE, CAPILLARY     Status: Abnormal   Collection Time   02/03/12  6:13 AM      Component Value Range Comment   Glucose-Capillary 113 (*) 70 - 99 mg/dL   GLUCOSE, CAPILLARY     Status: Abnormal   Collection Time   02/03/12 11:36 AM      Component Value Range Comment   Glucose-Capillary 127 (*) 70 - 99 mg/dL    Comment 1 Notify RN      Comment 2 Documented in Chart       Physical Findings: AIMS: Facial and Oral Movements Muscles of Facial Expression: None, normal Lips and Perioral Area: None, normal Jaw: None, normal Tongue: None, normal,Extremity Movements Upper (arms, wrists, hands, fingers): None, normal Lower (legs, knees, ankles, toes): None, normal, Trunk Movements Neck, shoulders, hips: None, normal, Overall Severity Severity of abnormal movements (highest score from questions above): None, normal Incapacitation due to abnormal movements: None, normal Patient's awareness of abnormal movements (rate only patient's report): No Awareness, Dental Status Current problems with teeth and/or dentures?: No  CIWA:    COWS:     Treatment Plan Summary: Daily contact with patient  to assess and evaluate symptoms and progress in treatment Medication management  Plan:  Medical Decision Making Problem Points:  Established problem, stable/improving (1), Review of last therapy session (1) and Review of psycho-social stressors (1) Data Points:  Review or order clinical lab tests (1) Review or order medicine tests (  1) Review and summation of old records (2) Review of medication regiment & side effects (2) Review of new medications or change in dosage (2)  I certify that inpatient services furnished can reasonably be expected to improve the patient's condition.   ADAMS,MICKIE D.RPA-C CAQ-Psych  02/03/2012, 1:17 PM

## 2012-02-04 LAB — GLUCOSE, CAPILLARY
Glucose-Capillary: 160 mg/dL — ABNORMAL HIGH (ref 70–99)
Glucose-Capillary: 217 mg/dL — ABNORMAL HIGH (ref 70–99)
Glucose-Capillary: 93 mg/dL (ref 70–99)

## 2012-02-04 LAB — BLOOD GAS, ARTERIAL
Bicarbonate: 23.7 mEq/L (ref 20.0–24.0)
O2 Saturation: 93.2 %
Patient temperature: 98.6
TCO2: 21.8 mmol/L (ref 0–100)

## 2012-02-04 LAB — POCT I-STAT TROPONIN I: Troponin i, poc: 0 ng/mL (ref 0.00–0.08)

## 2012-02-04 NOTE — Social Work (Deleted)
Interdisciplinary Treatment Plan Update (Adult)  Date:  02/04/2012  Time Reviewed:  6:34 AM    Progress in Treatment: Attending groups:   Yes   Participating in groups:  Yes Taking medication as prescribed:  Yes Tolerating medication:  Yes Family/Significant othe contact made: Contact made with family Patient understands diagnosis:  Yes Discussing patient identified problems/goals with staff: Yes Medical problems stabilized or resolved: Yes Denies suicidal/homicidal ideation: No -  Patient able to contract for safety Issues/concerns per patient self-inventory:  Other:  New problem(s) identified:  Reason for Continuation of Hospitalization: Anxiety Depression Medication stabilization Suicidal ideation  Interventions implemented related to continuation of hospitalization:  Medication mgement; safety checks q 15 mins; coping skills development  Additional comments:  Estimated length of stay: 3-5 days  Discharge Plan:  Outpatient follow up  New goal(s):  Review of initial/current patient goals per problem list:    1.  Goal(s): Eliminate SI/other thoughts of self harm   Met:  No  Target date: d/c  As evidenced by: Patient will no longer endorse SI/HI or other thoughts of self harm.    2.  Goal (s):Reduce depression/anxiety  Met: Yes  Target date: d/c  As evidenced by: Patient will rate symptoms at four or below    3.  Goal(s):.stabilize on meds   Met:  No  Target date: d/c  As evidenced by: Patient will report being stabilized on medications - less symptomatic    4.  Goal(s): Refer for outpatient follow up   Met:  No  Target date: d/c  As evidenced by: Follow up appointment will be scheduled    Attendees: Patient:   @TD  6:34 AM  Physican:  Elvin So, MD @TD  6:34 AM  Nursing:  Thurnell Garbe, RN  02/04/2012 6:34 AM  Nursing:    @TD  6:34 AM  Clinical Social Worker:  Garfield Cornea, LCSW @TD  6:34 AM  Other: Mickie Bail NP 02/04/2012  6:34 AM   Other:         02/04/2012 6:34 AM Other:

## 2012-02-04 NOTE — Progress Notes (Signed)
Writer received report from [place] that patient had been c/o chest pain and an order received to send pt to ED for evaluation. Patient currently off the unit at ED.

## 2012-02-04 NOTE — Progress Notes (Signed)
1:1 Nursing note- Patient returned to unit and given snack and visteril to aid with sleep. Report was called to Probation officer at 0200 by [place] at [facility]. Patient currently experiencing no chest pain was assisted to bathroom and bed by sitter, patient remains safe and 1:1 continues.

## 2012-02-04 NOTE — Progress Notes (Signed)
Patient lying in bed asleep no distress noted, patient remains safe and 1:1 continues.

## 2012-02-04 NOTE — Progress Notes (Signed)
[facility] MD Progress Note  02/04/2012 11:54 AM [name]  MRN:  [id number] Subjective:  Patient seen this morning. Patient is a poor historian, has no insight into her admission. Ruminates on loss of 3 children.  Diagnosis:   Axis I: Depressive Disorder NOS Axis II: Deferred Axis III:  Past Medical History  Diagnosis Date  . History of colonic polyps   . GERD (gastroesophageal reflux disease)   . Hyperlipidemia   . Diabetes mellitus without mention of complication   . Hypertension   . Myocardial infarction   . Coronary artery disease   . Fatty liver disease, nonalcoholic   . Cholelithiasis   . Iron deficiency anemia   . Angina   . Shortness of breath   . Asthma   . Arthritis     HANDS"   Axis IV: other psychosocial or environmental problems Axis V: 51-60 moderate symptoms  ADL's:  Impaired  Sleep: Fair  Appetite:  Fair   Psychiatric Specialty Exam: Review of Systems  Eyes: Positive for blurred vision.  Respiratory: Positive for cough.   Gastrointestinal: Positive for heartburn.  Musculoskeletal: Positive for myalgias and falls.  Neurological: Positive for weakness and headaches.  Psychiatric/Behavioral: Positive for depression.    Blood pressure 129/81, pulse 69, temperature 98.4 F (36.9 C), temperature source Oral, resp. rate 16, height 4' 7.51" (1.41 m), weight 60.328 kg (133 lb), SpO2 97.00%.Body mass index is 30.34 kg/(m^2).  General Appearance: Disheveled  Eye Contact::  Minimal  Speech:  Garbled  Volume:  Decreased  Mood:  Dysphoric  Affect:  Constricted  Thought Process:  Circumstantial  Orientation:  Full (Time, Place, and Person)  Thought Content:  Rumination  Suicidal Thoughts:  No  Homicidal Thoughts:  No  Memory:  Immediate;   Fair Recent;   Fair Remote;   Fair  Judgement:  Impaired  Insight:  Present  Psychomotor Activity:  Decreased  Concentration:  Poor  Recall:  Poor  Akathisia:  No  Handed:  Right  AIMS (if indicated):       Assets:  Housing  Sleep:  Number of Hours: 2    Current Medications: Current Facility-Administered Medications  Medication Dose Route Frequency Provider Last Rate Last Dose  . acetaminophen (TYLENOL) tablet 650 mg  650 mg Oral Q6H PRN Shuvon Rankin, NP   650 mg at 02/03/12 0821  . albuterol (PROVENTIL HFA;VENTOLIN HFA) 108 (90 BASE) MCG/ACT inhaler 1-2 puff  1-2 puff Inhalation Q6H PRN Bynum Bellows, MD      . albuterol (PROVENTIL) (5 MG/ML) 0.5% nebulizer solution 2.5 mg  2.5 mg Nebulization Q6H PRN Shuvon Rankin, NP      . alum & mag hydroxide-simeth (MAALOX/MYLANTA) 200-200-20 MG/5ML suspension 30 mL  30 mL Oral Q4H PRN Shuvon Rankin, NP   30 mL at 02/03/12 1615  . amLODipine (NORVASC) tablet 5 mg  5 mg Oral Daily Shuvon Rankin, NP   5 mg at 02/04/12 0759  . aspirin EC tablet 81 mg  81 mg Oral Daily Bynum Bellows, MD   81 mg at 02/04/12 0758  . budesonide-formoterol (SYMBICORT) 160-4.5 MCG/ACT inhaler 2 puff  2 puff Inhalation BID Merton Border, MD   2 puff at 02/04/12 0758  . famotidine (PEPCID) tablet 20 mg  20 mg Oral BID Shuvon Rankin, NP   20 mg at 02/04/12 0759  . hydrOXYzine (ATARAX/VISTARIL) tablet 50 mg  50 mg Oral QHS PRN Shuvon Rankin, NP   50 mg at 02/04/12 0231  . insulin aspart (  novoLOG) injection 0-9 Units  0-9 Units Subcutaneous TID WC Bynum Bellows, MD   2 Units at 02/04/12 0205  . iron polysaccharides (NIFEREX) capsule 150 mg  150 mg Oral Daily Bynum Bellows, MD   150 mg at 02/04/12 0759  . LORazepam (ATIVAN) tablet 1 mg  1 mg Oral Q8H PRN Shuvon Rankin, NP   1 mg at 02/03/12 1548  . magnesium hydroxide (MILK OF MAGNESIA) suspension 30 mL  30 mL Oral Daily PRN Shuvon Rankin, NP      . metFORMIN (GLUCOPHAGE) tablet 500 mg  500 mg Oral BID WC Cameren Earnest, MD      . metoprolol succinate (TOPROL-XL) 24 hr tablet 50 mg  50 mg Oral Daily Shuvon Rankin, NP   50 mg at 02/04/12 0802  . nitroGLYCERIN (NITROSTAT) SL tablet 0.4 mg  0.4 mg Sublingual Q5 min PRN Ricardo Schubach, MD    0.4 mg at 02/03/12 1550  . sertraline (ZOLOFT) tablet 75 mg  75 mg Oral Daily Shuvon Rankin, NP   75 mg at 02/04/12 0800    Lab Results:  Results for orders placed during the hospital encounter of 02/01/12 (from the past 48 hour(s))  GLUCOSE, CAPILLARY     Status: Abnormal   Collection Time   02/02/12 11:55 AM      Component Value Range Comment   Glucose-Capillary 104 (*) 70 - 99 mg/dL   GLUCOSE, CAPILLARY     Status: Abnormal   Collection Time   02/02/12  4:58 PM      Component Value Range Comment   Glucose-Capillary 142 (*) 70 - 99 mg/dL    Comment 1 Documented in Chart      Comment 2 Notify RN     GLUCOSE, CAPILLARY     Status: Abnormal   Collection Time   02/02/12  8:42 PM      Component Value Range Comment   Glucose-Capillary 134 (*) 70 - 99 mg/dL   GLUCOSE, CAPILLARY     Status: Abnormal   Collection Time   02/03/12  6:13 AM      Component Value Range Comment   Glucose-Capillary 113 (*) 70 - 99 mg/dL   GLUCOSE, CAPILLARY     Status: Abnormal   Collection Time   02/03/12 11:36 AM      Component Value Range Comment   Glucose-Capillary 127 (*) 70 - 99 mg/dL    Comment 1 Notify RN      Comment 2 Documented in Chart     GLUCOSE, CAPILLARY     Status: Abnormal   Collection Time   02/03/12  6:35 PM      Component Value Range Comment   Glucose-Capillary 126 (*) 70 - 99 mg/dL   CBC     Status: Abnormal   Collection Time   02/03/12  7:22 PM      Component Value Range Comment   WBC 8.1  4.0 - 10.5 K/uL    RBC 4.26  3.87 - 5.11 MIL/uL    Hemoglobin 9.9 (*) 12.0 - 15.0 g/dL    HCT 32.9 (*) 36.0 - 46.0 %    MCV 77.2 (*) 78.0 - 100.0 fL    MCH 23.2 (*) 26.0 - 34.0 pg    MCHC 30.1  30.0 - 36.0 g/dL    RDW 16.2 (*) 11.5 - 15.5 %    Platelets 468 (*) 150 - 400 K/uL   BASIC METABOLIC PANEL     Status: Abnormal   Collection Time  02/03/12  7:22 PM      Component Value Range Comment   Sodium 139  135 - 145 mEq/L    Potassium 3.8  3.5 - 5.1 mEq/L    Chloride 103  96 - 112  mEq/L    CO2 28  19 - 32 mEq/L    Glucose, Bld 154 (*) 70 - 99 mg/dL    BUN 10  6 - 23 mg/dL    Creatinine, Ser 0.86  0.50 - 1.10 mg/dL    Calcium 9.8  8.4 - 10.5 mg/dL    GFR calc non Af Amer 70 (*) >90 mL/min    GFR calc Af Amer 82 (*) >90 mL/min   TROPONIN I     Status: Normal   Collection Time   02/03/12  7:22 PM      Component Value Range Comment   Troponin I <0.30  <0.30 ng/mL   POCT I-STAT, CHEM 8     Status: Abnormal   Collection Time   02/03/12  7:33 PM      Component Value Range Comment   Sodium 145  135 - 145 mEq/L    Potassium 3.8  3.5 - 5.1 mEq/L    Chloride 106  96 - 112 mEq/L    BUN 10  6 - 23 mg/dL    Creatinine, Ser 0.90  0.50 - 1.10 mg/dL    Glucose, Bld 125 (*) 70 - 99 mg/dL    Calcium, Ion 1.22  1.13 - 1.30 mmol/L    TCO2 27  0 - 100 mmol/L    Hemoglobin 11.6 (*) 12.0 - 15.0 g/dL    HCT 34.0 (*) 36.0 - 46.0 %   POCT I-STAT TROPONIN I     Status: Normal   Collection Time   02/03/12 11:47 PM      Component Value Range Comment   Troponin i, poc 0.00  0.00 - 0.08 ng/mL    Comment 3            GLUCOSE, CAPILLARY     Status: Abnormal   Collection Time   02/04/12  1:42 AM      Component Value Range Comment   Glucose-Capillary 160 (*) 70 - 99 mg/dL    Comment 1 Documented in Chart      Comment 2 Notify RN       Physical Findings: AIMS: Facial and Oral Movements Muscles of Facial Expression: None, normal Lips and Perioral Area: None, normal Jaw: None, normal Tongue: None, normal,Extremity Movements Upper (arms, wrists, hands, fingers): None, normal Lower (legs, knees, ankles, toes): None, normal, Trunk Movements Neck, shoulders, hips: None, normal, Overall Severity Severity of abnormal movements (highest score from questions above): None, normal Incapacitation due to abnormal movements: None, normal Patient's awareness of abnormal movements (rate only patient's report): No Awareness, Dental Status Current problems with teeth and/or dentures?: No    CIWA:    COWS:     Treatment Plan Summary: Daily contact with patient to assess and evaluate symptoms and progress in treatment Medication management  Plan: Contact patient`s family to discuss her discharge plans and issues with safety. Patient not forthcoming about her admission. Will continue current plan of care.  Medical Decision Making Problem Points:  Established problem, stable/improving (1), Review of last therapy session (1) and Review of psycho-social stressors (1) Data Points:  Review and summation of old records (2) Review of medication regiment & side effects (2)  I certify that inpatient services furnished can reasonably be expected to  improve the patient's condition.   Caryn Gienger 02/04/2012, 11:54 AM

## 2012-02-04 NOTE — ED Notes (Signed)
Discussed medications with PA. Advised by PA to give insulin only.

## 2012-02-04 NOTE — Social Work (Signed)
Pt attended discharge planning group and actively participated in group.  CSW provided pt with today's workbook. Suicide prevention and education were also discussed in today's group. Patient's affect was very flat she discusses losing 3 children in a relatively short time frame with her last son dying in December. Patient reports one of her sons died of a heart attack, another died of kidney failure, and says she does not know why her third child died. Patient continually advocated for going home, denying she was suicidal and stated she had a nurse's aide he could care for her Monday through Friday and said her 63 year old grandson cares for her on the weekend.

## 2012-02-04 NOTE — Social Work (Signed)
Interdisciplinary Treatment Plan Update (Adult)  Date:  02/04/2012  Time Reviewed:  11:19 AM    Progress in Treatment: Attending groups:   Yes   Participating in groups:  Yes Taking medication as prescribed:  Yes Tolerating medication:  Yes Family/Significant othe contact made: No Patient understands diagnosis:  Yes Discussing patient identified problems/goals with staff: No Medical problems stabilized or resolved: Yes Denies suicidal/homicidal ideation: Yes Issues/concerns per patient self-inventory:  Other:  New problem(s) identified:  Reason for Continuation of Hospitalization: Anxiety Depression Medication stabilization  Interventions implemented related to continuation of hospitalization:  Medication mgement; safety checks q 15 mins; coping skills development  Additional comments: Patient reports having a lot of grief issues surrounding the deaths of her 3 children last death occurring in 02/12/12. Patient became tearful as she discussed not being able to go to her childrens funerals. Patient was heavily focused on going home and reported she had a nurse's aide to care for her Monday to Friday and a 63 year old grandson who could care for her on the weekends. Nursing staff reports patient went to the ER last night for chest pains.  Estimated length of stay: 3- for days  Discharge Plan:  Outpatient follow up to be scheduled  New goal(s):  Review of initial/current patient goals per problem list:    1.  Goal(s): Eliminate SI/other thoughts of self harm   Met: Yes  Target date: d/c  As evidenced by: Patient will no longer endorse SI/HI or other thoughts of self harm.    2.  Goal (s):Reduce depression/anxiety  Met: No  Target date: d/c  As evidenced by: Patient will rate symptoms at four or below    3.  Goal(s):.stabilize on meds   Met:  No  Target date: d/c  As evidenced by: Patient will report being stabilized on medications - less  symptomatic    4.  Goal(s): Refer for outpatient follow up   Met:  No  Target date: d/c  As evidenced by: Follow up appointment will be scheduled    Attendees: Patient:   @TD  11:19 AM  Physican:  Elvin So, MD @TD  11:19 AM  Nursing:  Thurnell Garbe, RN  02/04/2012 11:19 AM  Nursing:    @TD  11:19 AM  Clinical Social Worker:  Garfield Cornea, LCSW @TD  11:19 AM  Other: Mickie Bail NP 02/04/2012 11:19 AM   Other:         02/04/2012 11:19 AM Other:

## 2012-02-04 NOTE — Progress Notes (Signed)
[place] Group Notes:  (Counselor/Nursing/MHT/Case Management/Adjunct)  02/04/2012 8:05PM  Type of Therapy:  Psychoeducational Skills  Participation Level:  Did Not Attend  Participation Quality:  Did Not Attend  Affect:  Did Not Attend  Cognitive:  Did Not Attend  Insight:  Did Not Attend  Engagement in Group:  Did Not Attend  Engagement in Therapy:  Did Not Attend  Modes of Intervention:  Wrap-Up Group  Summary of Progress/Problems: Pt did not attend wrap-up group. Pt was in her room in the bed asleep  Jamon Hayhurst K 02/04/2012, 10:27 PM

## 2012-02-04 NOTE — Progress Notes (Signed)
[facility] LCSW Group Therapy  02/04/2012 4:27 PM  Type of Therapy:  Group Therapy  Participation Level:  None  Participation Quality:  Attentive and Resistant  Affect:  Depressed and Irritable  Cognitive:  Alert  Insight:  None  Engagement in Therapy:  Poor  Modes of Intervention:  Confrontation, Discussion, Exploration and Problem-solving  Summary of Progress/Problems: Today's group was about overcoming obstacles and discussion personal obstacles, how one is motivated to address what is holding them back and naming an obstacle and one way to move past it.  [name] did not participate in group today. She was encouraged to speak, but chose not to participate.   Nail, Trevor Mace 02/04/2012, 4:27 PM

## 2012-02-05 LAB — GLUCOSE, CAPILLARY: Glucose-Capillary: 157 mg/dL — ABNORMAL HIGH (ref 70–99)

## 2012-02-05 NOTE — Progress Notes (Signed)
Psychoeducational Group Note  Date:  02/05/2012 Time:  1000  Group Topic/Focus:  Coping Skills Pictionary Participation Level: Did Not Attend  Participation Quality:  Not Applicable  Affect:  Not Applicable  Cognitive:  Not Applicable  Insight:  Not Applicable  Engagement in Group: Not Applicable  Additional Comments:  Pt did not attend group. She remained resting in bed.  Elenor Legato 02/05/2012, 10:56 AM

## 2012-02-05 NOTE — Social Work (Signed)
[facility] LCSW Aftercare Discharge Planning Group Note  02/05/2012  8:45 AM  Participation Quality:  Patient did not attend group     [facility] LCSW Group Therapy  02/05/2012 1:15 PM  Type of Therapy:  Group Therapy  Participation Level:  Did Not Attend  Summary of Progress/Problems: Patient did not attend group therapy   Tiaria Garske LCSW 02/05/2012 6:56 AM

## 2012-02-05 NOTE — Progress Notes (Signed)
Left heel wound resolving, present of admission, patient stated she "gets them all the time", patient encouraged to elevate her feet in her wheelchair and remove her braces at bedtime, patient compliant with elevating her leg to relieve pressure but refuses to take her braces off at night, small scab remains (approximately 1 cm diameter), no sign and symptoms of infection, no edema, no odor

## 2012-02-05 NOTE — Progress Notes (Signed)
Patient ID: [name], female   DOB: 1950/11/09, 61 y.o.   MRN: [id number] D: Pt. Lying in bed, makes eye contact, reports depression at "1" of 10. "feel real good." pt. Says she moved here from [place] years ago. A: Writer introduced self to pt. Encouraged pt to verbalized needs. Staff will monitor 1:1 for safety. Pt. Encouraged to attend group. R: pt. Is safe on the unit. Pt. Did not attend group.

## 2012-02-05 NOTE — Progress Notes (Signed)
RN 1:1 Note: Patient is currently resting quietly in her room and has no acute distress at this time; respirations are even and unlabored; patient did not attend group this morning; patient denies SI/HI and A/V hallucinations; reports appetite is well; appetite is good; energy level is normal; ability to pay attention is good; depression 1/10; hopelessness 1/10; anxiety 2/10; nurse will continue to monitor

## 2012-02-05 NOTE — Progress Notes (Signed)
Patient ID: [name], female   DOB: 1950/12/21, 61 y.o.   MRN: [id number]  Problem: Depression  D: Patient with dull flat affect, endorses depression. Pt isolative to her room. A: Monitor patient Q 15 minutes for safety, encourage staff/peer interaction. Administer medications as ordered by MD. R: Patient compliant with her medications and denies SI or plans to harm herself at this time.

## 2012-02-05 NOTE — Progress Notes (Signed)
[facility] INPATIENT:  Family/Significant Other Suicide Prevention Education  Suicide Prevention Education:  Patient Refusal for Family/Significant Other Suicide Prevention Education: The patient [name] has refused to provide written consent for family/significant other to be provided Family/Significant Other Suicide Prevention Education during admission and/or prior to discharge.  Physician notified.  Garfield Cornea 02/05/2012, 2:48 PM

## 2012-02-05 NOTE — Progress Notes (Signed)
[facility] Group Notes:  (Counselor/Nursing/MHT/Case Management/Adjunct)  02/05/2012 8:10PM  Type of Therapy:  Psychoeducational Skills  Participation Level:  Did Not Attend  Participation Quality:  Did Not Attend  Affect:  Did Not Attend  Cognitive:  Did Not Attend  Insight:  Did Not Attend  Engagement in Group:  Did Not Attend  Engagement in Therapy:  Did Not Attend  Modes of Intervention:  Wrap-Up Group  Summary of Progress/Problems: Pt did not attend wrap-up group. Pt was in her room during group time  [name] K 02/05/2012, 10:12 PM

## 2012-02-05 NOTE — Progress Notes (Signed)
Psychoeducational Group Note  Date:  02/05/2012 Time: 1100  Group Topic/Focus:  Therapeutic Activity: Question [name]  Participation Level:  Did Not Attend  Participation Quality:    Affect:    Cognitive:    Insight:    Engagement in Group:   Additional Comments:    [name] 02/05/2012, 3:39 PM

## 2012-02-05 NOTE — Progress Notes (Signed)
[facility] MD Progress Note  02/05/2012 1:54 PM [name]  MRN:  [id number] Subjective:  1/10 depression, 3/10 anxiety Diagnosis:   Axis I: Bereavement and Depressive Disorder NOS Axis II: Deferred Axis III:  Past Medical History  Diagnosis Date  . History of colonic polyps   . GERD (gastroesophageal reflux disease)   . Hyperlipidemia   . Diabetes mellitus without mention of complication   . Hypertension   . Myocardial infarction   . Coronary artery disease   . Fatty liver disease, nonalcoholic   . Cholelithiasis   . Iron deficiency anemia   . Angina   . Shortness of breath   . Asthma   . Arthritis     HANDS"   Axis IV: other psychosocial or environmental problems, problems related to social environment and problems with primary support group Axis V: 41-50 serious symptoms  ADL's:  Intact  Sleep: Fair  Appetite:  Fair  Suicidal Ideation:  Denies Homicidal Ideation:  Denies  Psychiatric Specialty Exam: Review of Systems  Constitutional: Negative.   HENT: Negative.   Eyes: Negative.   Respiratory: Negative.   Cardiovascular: Negative.   Gastrointestinal: Negative.   Genitourinary: Negative.   Musculoskeletal: Negative.   Skin: Negative.   Neurological: Negative.   Endo/Heme/Allergies: Negative.   Psychiatric/Behavioral: Positive for depression and hallucinations. The patient is nervous/anxious.     Blood pressure 139/73, pulse 71, temperature 98.4 F (36.9 C), temperature source Oral, resp. rate 18, height 4' 7.51" (1.41 m), weight 60.328 kg (133 lb), SpO2 97.00%.Body mass index is 30.34 kg/(m^2).  General Appearance: Casual  Eye Contact::  Fair  Speech:  Slow  Volume:  Normal  Mood:  Depressed  Affect:  Congruent  Thought Process:  Coherent  Orientation:  Full (Time, Place, and Person)  Thought Content:  Hallucinations: Auditory Visual  Suicidal Thoughts:  No  Homicidal Thoughts:  No  Memory:  Immediate;   Fair Recent;   Fair Remote;   Fair    Judgement:  Fair  Insight:  Fair  Psychomotor Activity:  Decreased  Concentration:  Fair  Recall:  Fair  Akathisia:  No  Handed:  Right  AIMS (if indicated):     Assets:  Desire for Improvement Financial Resources/Insurance Housing Social Support  Sleep:  Number of Hours: 7    Current Medications: Current Facility-Administered Medications  Medication Dose Route Frequency Provider Last Rate Last Dose  . acetaminophen (TYLENOL) tablet 650 mg  650 mg Oral Q6H PRN Shuvon Rankin, NP   650 mg at 02/03/12 0821  . albuterol (PROVENTIL HFA;VENTOLIN HFA) 108 (90 BASE) MCG/ACT inhaler 1-2 puff  1-2 puff Inhalation Q6H PRN Bynum Bellows, MD      . albuterol (PROVENTIL) (5 MG/ML) 0.5% nebulizer solution 2.5 mg  2.5 mg Nebulization Q6H PRN Shuvon Rankin, NP      . alum & mag hydroxide-simeth (MAALOX/MYLANTA) 200-200-20 MG/5ML suspension 30 mL  30 mL Oral Q4H PRN Shuvon Rankin, NP   30 mL at 02/03/12 1615  . amLODipine (NORVASC) tablet 5 mg  5 mg Oral Daily Shuvon Rankin, NP   5 mg at 02/05/12 0830  . aspirin EC tablet 81 mg  81 mg Oral Daily Bynum Bellows, MD   81 mg at 02/05/12 0830  . budesonide-formoterol (SYMBICORT) 160-4.5 MCG/ACT inhaler 2 puff  2 puff Inhalation BID Merton Border, MD   2 puff at 02/05/12 0830  . famotidine (PEPCID) tablet 20 mg  20 mg Oral BID Earleen Newport, NP  20 mg at 02/05/12 0831  . hydrOXYzine (ATARAX/VISTARIL) tablet 50 mg  50 mg Oral QHS PRN Shuvon Rankin, NP   50 mg at 02/04/12 2234  . insulin aspart (novoLOG) injection 0-9 Units  0-9 Units Subcutaneous TID WC Bynum Bellows, MD   1 Units at 02/05/12 0730  . iron polysaccharides (NIFEREX) capsule 150 mg  150 mg Oral Daily Bynum Bellows, MD   150 mg at 02/05/12 0831  . LORazepam (ATIVAN) tablet 1 mg  1 mg Oral Q8H PRN Shuvon Rankin, NP   1 mg at 02/03/12 1548  . magnesium hydroxide (MILK OF MAGNESIA) suspension 30 mL  30 mL Oral Daily PRN Shuvon Rankin, NP      . metFORMIN (GLUCOPHAGE) tablet 500 mg  500 mg Oral BID  WC Himabindu Ravi, MD   500 mg at 02/05/12 0831  . metoprolol succinate (TOPROL-XL) 24 hr tablet 50 mg  50 mg Oral Daily Shuvon Rankin, NP   50 mg at 02/05/12 0830  . nitroGLYCERIN (NITROSTAT) SL tablet 0.4 mg  0.4 mg Sublingual Q5 min PRN Himabindu Ravi, MD   0.4 mg at 02/03/12 1550  . sertraline (ZOLOFT) tablet 75 mg  75 mg Oral Daily Shuvon Rankin, NP   75 mg at 02/05/12 0830    Lab Results:  Results for orders placed during the hospital encounter of 02/01/12 (from the past 48 hour(s))  GLUCOSE, CAPILLARY     Status: Abnormal   Collection Time   02/03/12  6:35 PM      Component Value Range Comment   Glucose-Capillary 126 (*) 70 - 99 mg/dL   CBC     Status: Abnormal   Collection Time   02/03/12  7:22 PM      Component Value Range Comment   WBC 8.1  4.0 - 10.5 K/uL    RBC 4.26  3.87 - 5.11 MIL/uL    Hemoglobin 9.9 (*) 12.0 - 15.0 g/dL    HCT 32.9 (*) 36.0 - 46.0 %    MCV 77.2 (*) 78.0 - 100.0 fL    MCH 23.2 (*) 26.0 - 34.0 pg    MCHC 30.1  30.0 - 36.0 g/dL    RDW 16.2 (*) 11.5 - 15.5 %    Platelets 468 (*) 150 - 400 K/uL   BASIC METABOLIC PANEL     Status: Abnormal   Collection Time   02/03/12  7:22 PM      Component Value Range Comment   Sodium 139  135 - 145 mEq/L    Potassium 3.8  3.5 - 5.1 mEq/L    Chloride 103  96 - 112 mEq/L    CO2 28  19 - 32 mEq/L    Glucose, Bld 154 (*) 70 - 99 mg/dL    BUN 10  6 - 23 mg/dL    Creatinine, Ser 0.86  0.50 - 1.10 mg/dL    Calcium 9.8  8.4 - 10.5 mg/dL    GFR calc non Af Amer 70 (*) >90 mL/min    GFR calc Af Amer 82 (*) >90 mL/min   TROPONIN I     Status: Normal   Collection Time   02/03/12  7:22 PM      Component Value Range Comment   Troponin I <0.30  <0.30 ng/mL   POCT I-STAT, CHEM 8     Status: Abnormal   Collection Time   02/03/12  7:33 PM      Component Value Range Comment   Sodium 145  135 - 145 mEq/L    Potassium 3.8  3.5 - 5.1 mEq/L    Chloride 106  96 - 112 mEq/L    BUN 10  6 - 23 mg/dL    Creatinine, Ser 0.90   0.50 - 1.10 mg/dL    Glucose, Bld 125 (*) 70 - 99 mg/dL    Calcium, Ion 1.22  1.13 - 1.30 mmol/L    TCO2 27  0 - 100 mmol/L    Hemoglobin 11.6 (*) 12.0 - 15.0 g/dL    HCT 34.0 (*) 36.0 - 46.0 %   POCT I-STAT TROPONIN I     Status: Normal   Collection Time   02/03/12 11:47 PM      Component Value Range Comment   Troponin i, poc 0.00  0.00 - 0.08 ng/mL    Comment 3            GLUCOSE, CAPILLARY     Status: Abnormal   Collection Time   02/04/12  1:42 AM      Component Value Range Comment   Glucose-Capillary 160 (*) 70 - 99 mg/dL    Comment 1 Documented in Chart      Comment 2 Notify RN     GLUCOSE, CAPILLARY     Status: Normal   Collection Time   02/04/12  6:14 AM      Component Value Range Comment   Glucose-Capillary 99  70 - 99 mg/dL    Comment 1 Notify RN      Comment 2 Documented in Chart     GLUCOSE, CAPILLARY     Status: Abnormal   Collection Time   02/04/12 11:46 AM      Component Value Range Comment   Glucose-Capillary 217 (*) 70 - 99 mg/dL    Comment 1 Documented in Chart      Comment 2 Notify RN     GLUCOSE, CAPILLARY     Status: Normal   Collection Time   02/04/12  5:36 PM      Component Value Range Comment   Glucose-Capillary 93  70 - 99 mg/dL   GLUCOSE, CAPILLARY     Status: Abnormal   Collection Time   02/04/12 10:20 PM      Component Value Range Comment   Glucose-Capillary 141 (*) 70 - 99 mg/dL    Comment 1 Notify RN      Comment 2 Documented in Chart     GLUCOSE, CAPILLARY     Status: Abnormal   Collection Time   02/05/12  6:40 AM      Component Value Range Comment   Glucose-Capillary 130 (*) 70 - 99 mg/dL   GLUCOSE, CAPILLARY     Status: Normal   Collection Time   02/05/12 11:47 AM      Component Value Range Comment   Glucose-Capillary 99  70 - 99 mg/dL     Physical Findings: AIMS: Facial and Oral Movements Muscles of Facial Expression: None, normal Lips and Perioral Area: None, normal Jaw: None, normal Tongue: None, normal,Extremity  Movements Upper (arms, wrists, hands, fingers): None, normal Lower (legs, knees, ankles, toes): None, normal, Trunk Movements Neck, shoulders, hips: None, normal, Overall Severity Severity of abnormal movements (highest score from questions above): None, normal Incapacitation due to abnormal movements: None, normal Patient's awareness of abnormal movements (rate only patient's report): No Awareness, Dental Status Current problems with teeth and/or dentures?: No Does patient usually wear dentures?: No  CIWA:    COWS:  Treatment Plan Summary: Daily contact with patient to assess and evaluate symptoms and progress in treatment Medication management  Plan:  Review of chart, vital signs, medications, and notes. 1-Individual and group therapy 2-Medication management for depression and anxiety. 3-Coping skills for depression and anxiety 4-Continue crisis stabilization and management; patient reported her stress of 1/10 but still can't talk about her sons without crying and does not want to discuss their deaths, stills sees and hears them occasionally 5-Supportive environment in place to optimize care   6-Treatment plan in progress to prevent relapse of depression and anxiety   Medical Decision Making Problem Points:  Established problem, stable/improving (1) and Review of psycho-social stressors (1) Data Points:  Review of new medications or change in dosage (2)  I certify that inpatient services furnished can reasonably be expected to improve the patient's condition.   Waylan Boga, [place] 02/05/2012, 1:54 PM

## 2012-02-06 DIAGNOSIS — F341 Dysthymic disorder: Secondary | ICD-10-CM

## 2012-02-06 DIAGNOSIS — R45851 Suicidal ideations: Secondary | ICD-10-CM

## 2012-02-06 LAB — GLUCOSE, CAPILLARY: Glucose-Capillary: 110 mg/dL — ABNORMAL HIGH (ref 70–99)

## 2012-02-06 MED ORDER — POLYSACCHARIDE IRON COMPLEX 150 MG PO CAPS: 150.0000 mg | ORAL_CAPSULE | Freq: Every day | ORAL | Status: DC

## 2012-02-06 MED ORDER — AMLODIPINE BESYLATE 5 MG PO TABS: 5.0000 mg | ORAL_TABLET | Freq: Every day | ORAL | Status: DC

## 2012-02-06 MED ORDER — CARVEDILOL 6.25 MG PO TABS: 6.2500 mg | ORAL_TABLET | Freq: Two times a day (BID) | ORAL | Status: DC

## 2012-02-06 MED ORDER — METOPROLOL SUCCINATE ER 50 MG PO TB24: 50.0000 mg | ORAL_TABLET | Freq: Every day | ORAL | Status: DC

## 2012-02-06 MED ORDER — BUDESONIDE-FORMOTEROL FUMARATE 160-4.5 MCG/ACT IN AERO: 2.0000 | INHALATION_SPRAY | Freq: Two times a day (BID) | RESPIRATORY_TRACT | Status: DC

## 2012-02-06 MED ORDER — METFORMIN HCL 500 MG PO TABS: 500.0000 mg | ORAL_TABLET | Freq: Two times a day (BID) | ORAL | Status: DC

## 2012-02-06 MED ORDER — HYDROXYZINE HCL 25 MG PO TABS: 25.0000 mg | ORAL_TABLET | Freq: Two times a day (BID) | ORAL | Status: DC

## 2012-02-06 MED ORDER — CYANOCOBALAMIN 1000 MCG/ML IJ SOLN
1000.0000 ug | INTRAMUSCULAR | Status: AC
  Administered 2012-03-17: 1000 ug via INTRAMUSCULAR

## 2012-02-06 MED ORDER — ALBUTEROL SULFATE HFA 108 (90 BASE) MCG/ACT IN AERS: 1.0000 | INHALATION_SPRAY | Freq: Four times a day (QID) | RESPIRATORY_TRACT | Status: DC | PRN

## 2012-02-06 MED ORDER — SERTRALINE HCL 25 MG PO TABS: 75.0000 mg | ORAL_TABLET | Freq: Every day | ORAL | Status: DC

## 2012-02-06 MED ORDER — FAMOTIDINE 20 MG PO TABS: 20.0000 mg | ORAL_TABLET | Freq: Two times a day (BID) | ORAL | Status: DC

## 2012-02-06 MED ORDER — NITROGLYCERIN 0.4 MG SL SUBL: 0.4000 mg | SUBLINGUAL_TABLET | SUBLINGUAL | Status: DC | PRN

## 2012-02-06 MED ORDER — ASPIRIN 81 MG PO TBEC: 81.0000 mg | DELAYED_RELEASE_TABLET | Freq: Every day | ORAL | Status: DC

## 2012-02-06 NOTE — ED Provider Notes (Signed)
Medical screening examination/treatment/procedure(s) were performed by non-physician practitioner and as supervising physician I was immediately available for consultation/collaboration.   Wynetta Fines, MD 02/06/12 [phone]

## 2012-02-06 NOTE — Discharge Summary (Signed)
Physician Discharge Summary Note  Patient:  [name] is an 62 y.o., female MRN:  [id number] DOB:  1950/02/20 Patient phone:  [phone] (home)  Patient address:   [address] [place] [address],   Date of Admission:  02/01/2012 Date of Discharge: 02/06/2012  Reason for Admission:  Depression, hallucinations, suicidal ideations  Discharge Diagnoses: Principal Problem:  *Suicidal ideation Active Problems:  Bereavement  Depression  Anemia  SOB (shortness of breath)  Abdominal  pain, other specified site  Review of Systems  Constitutional: Negative.   HENT: Negative.   Eyes: Negative.   Respiratory: Negative.   Cardiovascular: Negative.   Gastrointestinal: Negative.   Genitourinary: Negative.   Musculoskeletal: Negative.   Skin: Negative.   Neurological: Negative.   Endo/Heme/Allergies: Negative.   Psychiatric/Behavioral: Positive for depression.   Axis Diagnosis:   AXIS I:  Anxiety Disorder NOS, Bereavement and Depressive Disorder NOS AXIS II:  Deferred AXIS III:   Past Medical History  Diagnosis Date  . History of colonic polyps   . GERD (gastroesophageal reflux disease)   . Hyperlipidemia   . Diabetes mellitus without mention of complication   . Hypertension   . Myocardial infarction   . Coronary artery disease   . Fatty liver disease, nonalcoholic   . Cholelithiasis   . Iron deficiency anemia   . Angina   . Shortness of breath   . Asthma   . Arthritis     HANDS"   AXIS IV:  other psychosocial or environmental problems, problems related to social environment and problems with primary support group AXIS V:  61-70 mild symptoms  Level of Care:  OP  Hospital Course:   1-Individual and group therapy for depression and bereavement issues 2-Medication management for depression and anxiety:  Vistaril and ativan ordered for anxiety, Zoloft increased  3-Coping skill development for depression, anxiety, and bereavement 4-Address health  issues--monitoring POCT and blood pressures--two consults:  Cardiac consult regarding her blood pressure issues--coreg discontinued, aspirin 81 mg started.  Internal medicine consult for shortness of breath issues--Symbicort and nebulizer treatments ordered, medically stable 5-Treatment plan in place to prevent relapse of depression and anxiety 6-Patient denied suicidal/homicidal ideations and auditory/visual hallucinations, follow-up appointments in place, Rx given, Katti's caregiver will come to [facility] to take her home, she has a caregiver and a 24 yo son to assist her at home  Consults:  cardiology  Significant Diagnostic Studies:  labs: Completed and reviewed, stable  Discharge Vitals:   Blood pressure 139/73, pulse 71, temperature 98.4 F (36.9 C), temperature source Oral, resp. rate 18, height 4' 7.51" (1.41 m), weight 60.328 kg (133 lb), SpO2 97.00%. Body mass index is 30.34 kg/(m^2). Lab Results:   Results for orders placed during the hospital encounter of 02/01/12 (from the past 72 hour(s))  GLUCOSE, CAPILLARY     Status: Abnormal   Collection Time   02/03/12 11:36 AM      Component Value Range Comment   Glucose-Capillary 127 (*) 70 - 99 mg/dL    Comment 1 Notify RN      Comment 2 Documented in Chart     GLUCOSE, CAPILLARY     Status: Abnormal   Collection Time   02/03/12  6:35 PM      Component Value Range Comment   Glucose-Capillary 126 (*) 70 - 99 mg/dL   CBC     Status: Abnormal   Collection Time   02/03/12  7:22 PM      Component Value Range Comment  WBC 8.1  4.0 - 10.5 K/uL    RBC 4.26  3.87 - 5.11 MIL/uL    Hemoglobin 9.9 (*) 12.0 - 15.0 g/dL    HCT 32.9 (*) 36.0 - 46.0 %    MCV 77.2 (*) 78.0 - 100.0 fL    MCH 23.2 (*) 26.0 - 34.0 pg    MCHC 30.1  30.0 - 36.0 g/dL    RDW 16.2 (*) 11.5 - 15.5 %    Platelets 468 (*) 150 - 400 K/uL   BASIC METABOLIC PANEL     Status: Abnormal   Collection Time   02/03/12  7:22 PM      Component Value Range Comment   Sodium 139   135 - 145 mEq/L    Potassium 3.8  3.5 - 5.1 mEq/L    Chloride 103  96 - 112 mEq/L    CO2 28  19 - 32 mEq/L    Glucose, Bld 154 (*) 70 - 99 mg/dL    BUN 10  6 - 23 mg/dL    Creatinine, Ser 0.86  0.50 - 1.10 mg/dL    Calcium 9.8  8.4 - 10.5 mg/dL    GFR calc non Af Amer 70 (*) >90 mL/min    GFR calc Af Amer 82 (*) >90 mL/min   TROPONIN I     Status: Normal   Collection Time   02/03/12  7:22 PM      Component Value Range Comment   Troponin I <0.30  <0.30 ng/mL   POCT I-STAT, CHEM 8     Status: Abnormal   Collection Time   02/03/12  7:33 PM      Component Value Range Comment   Sodium 145  135 - 145 mEq/L    Potassium 3.8  3.5 - 5.1 mEq/L    Chloride 106  96 - 112 mEq/L    BUN 10  6 - 23 mg/dL    Creatinine, Ser 0.90  0.50 - 1.10 mg/dL    Glucose, Bld 125 (*) 70 - 99 mg/dL    Calcium, Ion 1.22  1.13 - 1.30 mmol/L    TCO2 27  0 - 100 mmol/L    Hemoglobin 11.6 (*) 12.0 - 15.0 g/dL    HCT 34.0 (*) 36.0 - 46.0 %   POCT I-STAT TROPONIN I     Status: Normal   Collection Time   02/03/12 11:47 PM      Component Value Range Comment   Troponin i, poc 0.00  0.00 - 0.08 ng/mL    Comment 3            GLUCOSE, CAPILLARY     Status: Abnormal   Collection Time   02/04/12  1:42 AM      Component Value Range Comment   Glucose-Capillary 160 (*) 70 - 99 mg/dL    Comment 1 Documented in Chart      Comment 2 Notify RN     GLUCOSE, CAPILLARY     Status: Normal   Collection Time   02/04/12  6:14 AM      Component Value Range Comment   Glucose-Capillary 99  70 - 99 mg/dL    Comment 1 Notify RN      Comment 2 Documented in Chart     GLUCOSE, CAPILLARY     Status: Abnormal   Collection Time   02/04/12 11:46 AM      Component Value Range Comment   Glucose-Capillary 217 (*) 70 - 99 mg/dL  Comment 1 Documented in Chart      Comment 2 Notify RN     GLUCOSE, CAPILLARY     Status: Normal   Collection Time   02/04/12  5:36 PM      Component Value Range Comment   Glucose-Capillary 93  70 - 99  mg/dL   GLUCOSE, CAPILLARY     Status: Abnormal   Collection Time   02/04/12 10:20 PM      Component Value Range Comment   Glucose-Capillary 141 (*) 70 - 99 mg/dL    Comment 1 Notify RN      Comment 2 Documented in Chart     GLUCOSE, CAPILLARY     Status: Abnormal   Collection Time   02/05/12  6:40 AM      Component Value Range Comment   Glucose-Capillary 130 (*) 70 - 99 mg/dL   GLUCOSE, CAPILLARY     Status: Normal   Collection Time   02/05/12 11:47 AM      Component Value Range Comment   Glucose-Capillary 99  70 - 99 mg/dL   GLUCOSE, CAPILLARY     Status: Abnormal   Collection Time   02/05/12  5:00 PM      Component Value Range Comment   Glucose-Capillary 166 (*) 70 - 99 mg/dL   GLUCOSE, CAPILLARY     Status: Abnormal   Collection Time   02/05/12  8:56 PM      Component Value Range Comment   Glucose-Capillary 157 (*) 70 - 99 mg/dL   GLUCOSE, CAPILLARY     Status: Abnormal   Collection Time   02/06/12  6:26 AM      Component Value Range Comment   Glucose-Capillary 120 (*) 70 - 99 mg/dL     Physical Findings: AIMS: Facial and Oral Movements Muscles of Facial Expression: None, normal Lips and Perioral Area: None, normal Jaw: None, normal Tongue: None, normal,Extremity Movements Upper (arms, wrists, hands, fingers): None, normal Lower (legs, knees, ankles, toes): None, normal, Trunk Movements Neck, shoulders, hips: None, normal, Overall Severity Severity of abnormal movements (highest score from questions above): None, normal Incapacitation due to abnormal movements: None, normal Patient's awareness of abnormal movements (rate only patient's report): No Awareness, Dental Status Current problems with teeth and/or dentures?: No Does patient usually wear dentures?: No  CIWA:    COWS:     Psychiatric Specialty Exam: See Psychiatric Specialty Exam and Suicide Risk Assessment completed by Attending Physician prior to discharge.  Discharge destination:  Home  Is patient  on multiple antipsychotic therapies at discharge:  No   Has Patient had three or more failed trials of antipsychotic monotherapy by history:  No Recommended Plan for Multiple Antipsychotic Therapies: N/A  Discharge Orders    Future Appointments: Provider: Department: Dept Phone: Center:   02/15/2012 1:30 PM [facility] [phone] [facility]     Future Orders Please Complete By Expires   Diet - low sodium heart healthy      Activity as tolerated - No restrictions          Medication List     As of 02/06/2012  9:02 AM    STOP taking these medications         carvedilol 6.25 MG tablet   Commonly known as: COREG      TAKE these medications      Indication    albuterol 108 (90 BASE) MCG/ACT inhaler   Commonly known as: PROVENTIL HFA;VENTOLIN HFA   Inhale  1-2 puffs into the lungs every 6 (six) hours as needed for wheezing or shortness of breath.    Indication: Chronic Obstructive Lung Disease      ALPRAZolam 0.25 MG tablet   Commonly known as: XANAX   Take 0.25 mg by mouth at bedtime as needed. For sleep       amLODipine 5 MG tablet   Commonly known as: NORVASC   Take 1 tablet (5 mg total) by mouth daily.    Indication: High Blood Pressure      aspirin 81 MG EC tablet   Take 1 tablet (81 mg total) by mouth daily.    Indication: prevent blood clots      budesonide-formoterol 160-4.5 MCG/ACT inhaler   Commonly known as: SYMBICORT   Inhale 2 puffs into the lungs 2 (two) times daily.    Indication: Chronic Obstructive Lung Disease      famotidine 20 MG tablet   Commonly known as: PEPCID   Take 1 tablet (20 mg total) by mouth 2 (two) times daily.    Indication: Gastroesophageal Reflux Disease      hydrOXYzine 25 MG tablet   Commonly known as: ATARAX/VISTARIL   Take 1 tablet (25 mg total) by mouth 2 (two) times daily.    Indication: anxiety      iron polysaccharides 150 MG capsule   Commonly known as: NIFEREX   Take 1 capsule (150  mg total) by mouth daily.    Indication: Anemia From Inadequate Iron in the Body      metFORMIN 500 MG tablet   Commonly known as: GLUCOPHAGE   Take 1 tablet (500 mg total) by mouth 2 (two) times daily with a meal.    Indication: Type 2 Diabetes      metoprolol succinate 50 MG 24 hr tablet   Commonly known as: TOPROL-XL   Take 1 tablet (50 mg total) by mouth daily. Take with or immediately following a meal.    Indication: High Blood Pressure      nitroGLYCERIN 0.4 MG SL tablet   Commonly known as: NITROSTAT   Place 1 tablet (0.4 mg total) under the tongue every 5 (five) minutes as needed. For chest pain    Indication: Acute Angina Pectoris      sertraline 25 MG tablet   Commonly known as: ZOLOFT   Take 3 tablets (75 mg total) by mouth daily.    Indication: Anxiety Disorder, depression           Follow-up Information    Follow up with [facility]. On 02/18/2012. (Appointment scheduled with primary care physician Dr. Criss Rosales for Monday, January 13 at 6 PM.)    Contact information:   [address] [place], [place] [address] [phone]      Please follow up. (Patient has refused to give consent to see an outpatient therapist or outpatient psychiatrist)          Follow-up recommendations:  Activity as tolerated, low-sodium low-carb diet  Comments:  Patient will follow-up with the [facility] for her psychiatric needs after discharge, her caregiver her 60 yo grandson will take her.  Total Discharge Time:  Greater than 30 minutes.  SignedWaylan Boga, [place] 02/06/2012, 9:02 AM

## 2012-02-06 NOTE — Progress Notes (Signed)
Patient denies SI/HI, denies A/V hallucinations. Patient verbalizes understanding of discharge instructions, follow up care and prescriptions. Patient given all belongings from [facility] locker. Patient escorted out by staff, transported by family. 

## 2012-02-06 NOTE — [facility] Suicide Risk Assessment (Signed)
Suicide Risk Assessment  Discharge Assessment     Demographic Factors:  female  Mental Status Per Nursing Assessment::   On Admission:     Current Mental Status by Physician: patient denies suicidal ideations, intent or plan.  Loss Factors: multiple death in the family  Historical Factors: bereavement  Risk Reduction Factors:   Sense of responsibility to family, Religious beliefs about death and Living with another person, especially a relative  Continued Clinical Symptoms:  Resolving depressive symptoms  Cognitive Features That Contribute To Risk:  Closed-mindedness Polarized thinking    Suicide Risk:  Minimal: No identifiable suicidal ideation.  Patients presenting with no risk factors but with morbid ruminations; may be classified as minimal risk based on the severity of the depressive symptoms  Discharge Diagnoses:   AXIS I:  Depressive disorder, NOS AXIS II:  Deferred AXIS III:   Past Medical History  Diagnosis Date  . History of colonic polyps   . GERD (gastroesophageal reflux disease)   . Hyperlipidemia   . Diabetes mellitus without mention of complication   . Hypertension   . Myocardial infarction   . Coronary artery disease   . Fatty liver disease, nonalcoholic   . Cholelithiasis   . Iron deficiency anemia   . Angina   . Shortness of breath   . Asthma   . Arthritis     HANDS"   AXIS IV:  other psychosocial or environmental problems and bereavement AXIS V:  61-70 mild symptoms  Plan Of Care/Follow-up recommendations:  Activity:  as tolerated Diet:  healthy Tests:  routine Other:  patient to keep her after care appointment.  Is patient on multiple antipsychotic therapies at discharge:  No   Has Patient had three or more failed trials of antipsychotic monotherapy by history:  No  Recommended Plan for Multiple Antipsychotic Therapies: N/A  Dominique Innes,MD 02/06/2012, 10:21 AM

## 2012-02-07 NOTE — H&P (Addendum)
Reviewed. Patient seen and assessed. Agree with key elements of H&P.  [name] is an 62 yo AAF who presented with suicidal thoughts and "just not wanting to be on this Earth anymore." to the ED after being sent [employer]. Patient very evasive , per [facility] assessment Pt was tearful and despondent regarding the death of her third child on 2023/02/01.Patient endorsing grief over death of child. Pt denies HI or any active psychosis. Pt denies a previous psychiatric history, inpatient or outpatient. Pt admits feeling that she needs to be here and is unable to appropriately care for herself in this state.

## 2012-02-07 NOTE — Progress Notes (Signed)
Reviewed

## 2012-02-07 NOTE — Discharge Summary (Signed)
Reviewed

## 2012-02-07 NOTE — Progress Notes (Signed)
[facility] Child/Adolescent Case Management Discharge Plan :  Will you be returning to the same living situation after discharge: Yes,     At discharge, do you have transportation home?:Yes,    Do you have the ability to pay for your medications:Yes,     Release of information consent forms completed and in the chart;  Patient's signature needed at discharge.  Patient to Follow up at: Follow-up Information    Follow up with [facility]. On 02/18/2012. (Appointment scheduled with primary care physician Dr. Criss Rosales for Monday, January 13 at 6 PM.)    Contact information:   [address] [place], [place] [address] [phone]      Please follow up. (Patient has refused to give consent to see an outpatient therapist or outpatient psychiatrist)          Family Contact:  Patient refused  Patient denies SI/HI:   Yes,       Safety Planning and Suicide Prevention discussed:  Yes,      [name], [name] 02/07/2012, 7:29 AM

## 2012-02-08 NOTE — Progress Notes (Signed)
Patient Discharge Instructions:  After Visit Summary (AVS):   Faxed to:  02/08/12 Psychiatric Admission Assessment Note:   Faxed to:  02/08/12 Suicide Risk Assessment - Discharge Assessment:   Faxed to:  02/08/12 Faxed/Sent to the Next Level Care provider:  02/08/12 Faxed to Dr. Criss Rosales @ [place], 02/08/2012, 3:38 PM

## 2012-02-15 ENCOUNTER — Ambulatory Visit (INDEPENDENT_AMBULATORY_CARE_PROVIDER_SITE_OTHER): Payer: Medicaid Other | Admitting: Internal Medicine

## 2012-02-15 DIAGNOSIS — E538 Deficiency of other specified B group vitamins: Secondary | ICD-10-CM

## 2012-02-15 MED ORDER — CYANOCOBALAMIN 1000 MCG/ML IJ SOLN
1000.0000 ug | Freq: Once | INTRAMUSCULAR | Status: AC
  Administered 2012-02-15: 1000 ug via INTRAMUSCULAR

## 2012-03-17 ENCOUNTER — Ambulatory Visit (INDEPENDENT_AMBULATORY_CARE_PROVIDER_SITE_OTHER): Payer: Medicaid Other | Admitting: Internal Medicine

## 2012-03-17 DIAGNOSIS — D649 Anemia, unspecified: Secondary | ICD-10-CM

## 2012-03-17 DIAGNOSIS — D509 Iron deficiency anemia, unspecified: Secondary | ICD-10-CM

## 2012-05-03 IMAGING — CR DG CHEST 1V PORT
1 series · 1 of 1 positions shown · non-contrast
Comparison: 02/13/2009

CLINICAL DATA: Chest pain.  Short of breath.  Hypertension and
diabetes.

PORTABLE CHEST - 1 VIEW

[view not recorded]
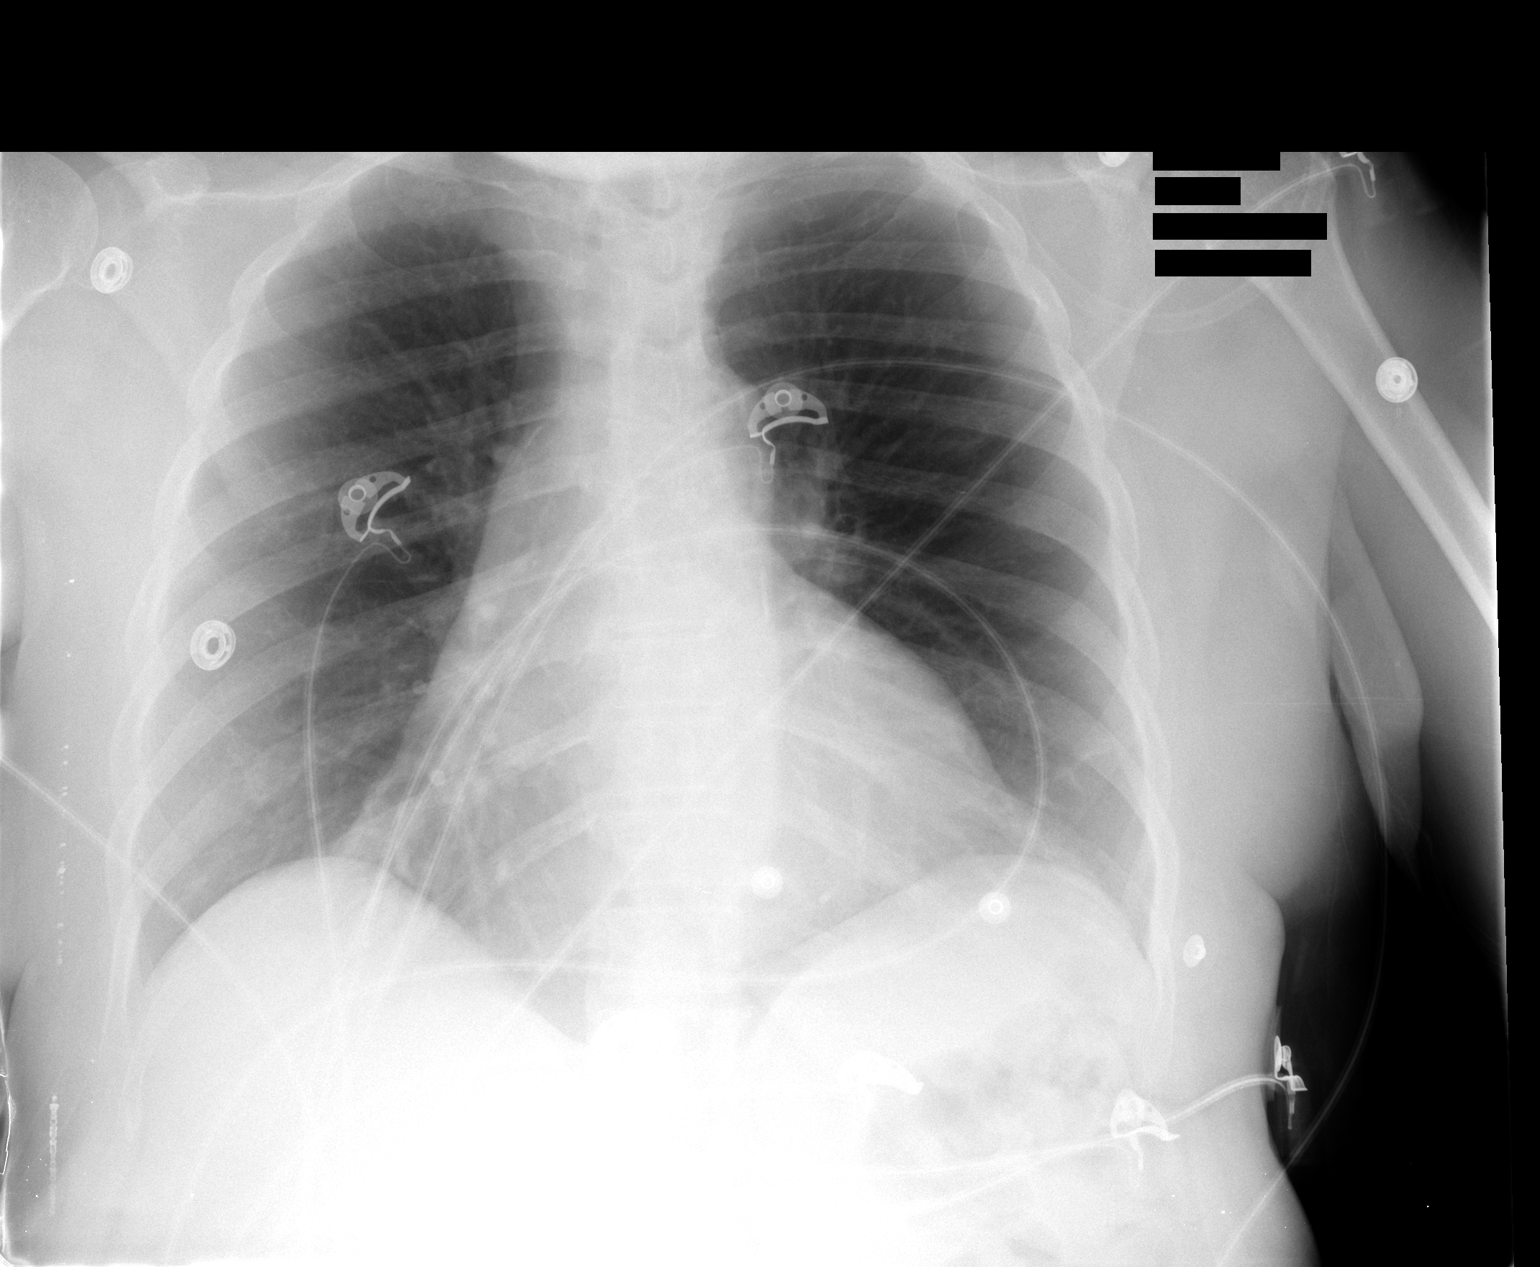

[1 of 1 positions shown; findings below may reference images not displayed]

FINDINGS: Artifact overlies chest.  The heart is at the upper
limits normal in size.  Mediastinum is unremarkable.  There may be
minimal atelectasis at the lung bases but largely the lungs are
clear.  The vascularity is normal.  No effusions.
IMPRESSION: Borderline cardiomegaly.  No active disease versus minimal basilar
atelectasis.

## 2012-05-12 ENCOUNTER — Emergency Department (HOSPITAL_COMMUNITY): Payer: Medicaid Other

## 2012-05-12 ENCOUNTER — Emergency Department (HOSPITAL_COMMUNITY)
Admission: EM | Admit: 2012-05-12 | Discharge: 2012-05-12 | Disposition: A | Discharge status: Home / Self Care | Payer: Medicaid Other | Attending: Emergency Medicine | Admitting: Emergency Medicine

## 2012-05-12 ENCOUNTER — Encounter (HOSPITAL_COMMUNITY): Payer: Self-pay | Admitting: Nurse Practitioner

## 2012-05-12 DIAGNOSIS — Z87891 Personal history of nicotine dependence: Secondary | ICD-10-CM | POA: Insufficient documentation

## 2012-05-12 DIAGNOSIS — E785 Hyperlipidemia, unspecified: Secondary | ICD-10-CM | POA: Insufficient documentation

## 2012-05-12 DIAGNOSIS — Z79899 Other long term (current) drug therapy: Secondary | ICD-10-CM | POA: Insufficient documentation

## 2012-05-12 DIAGNOSIS — Z7982 Long term (current) use of aspirin: Secondary | ICD-10-CM | POA: Insufficient documentation

## 2012-05-12 DIAGNOSIS — R079 Chest pain, unspecified: Secondary | ICD-10-CM

## 2012-05-12 DIAGNOSIS — Z8719 Personal history of other diseases of the digestive system: Secondary | ICD-10-CM | POA: Insufficient documentation

## 2012-05-12 DIAGNOSIS — Z862 Personal history of diseases of the blood and blood-forming organs and certain disorders involving the immune mechanism: Secondary | ICD-10-CM | POA: Insufficient documentation

## 2012-05-12 DIAGNOSIS — I252 Old myocardial infarction: Secondary | ICD-10-CM | POA: Insufficient documentation

## 2012-05-12 DIAGNOSIS — I10 Essential (primary) hypertension: Secondary | ICD-10-CM | POA: Insufficient documentation

## 2012-05-12 DIAGNOSIS — K7460 Unspecified cirrhosis of liver: Secondary | ICD-10-CM | POA: Insufficient documentation

## 2012-05-12 DIAGNOSIS — M19049 Primary osteoarthritis, unspecified hand: Secondary | ICD-10-CM | POA: Insufficient documentation

## 2012-05-12 DIAGNOSIS — J45909 Unspecified asthma, uncomplicated: Secondary | ICD-10-CM | POA: Insufficient documentation

## 2012-05-12 DIAGNOSIS — E119 Type 2 diabetes mellitus without complications: Secondary | ICD-10-CM | POA: Insufficient documentation

## 2012-05-12 DIAGNOSIS — R0602 Shortness of breath: Secondary | ICD-10-CM | POA: Insufficient documentation

## 2012-05-12 DIAGNOSIS — I2510 Atherosclerotic heart disease of native coronary artery without angina pectoris: Secondary | ICD-10-CM | POA: Insufficient documentation

## 2012-05-12 DIAGNOSIS — Z8679 Personal history of other diseases of the circulatory system: Secondary | ICD-10-CM | POA: Insufficient documentation

## 2012-05-12 DIAGNOSIS — K219 Gastro-esophageal reflux disease without esophagitis: Secondary | ICD-10-CM | POA: Insufficient documentation

## 2012-05-12 LAB — CBC
HCT: 30.2 % — ABNORMAL LOW (ref 36.0–46.0)
MCV: 74.2 fL — ABNORMAL LOW (ref 78.0–100.0)
Platelets: 411 10*3/uL — ABNORMAL HIGH (ref 150–400)
RBC: 4.07 MIL/uL (ref 3.87–5.11)
WBC: 6.5 10*3/uL (ref 4.0–10.5)

## 2012-05-12 LAB — BASIC METABOLIC PANEL
CO2: 23 mEq/L (ref 19–32)
Chloride: 105 mEq/L (ref 96–112)
Creatinine, Ser: 0.88 mg/dL (ref 0.50–1.10)
Potassium: 3.7 mEq/L (ref 3.5–5.1)

## 2012-05-12 LAB — PRO B NATRIURETIC PEPTIDE: Pro B Natriuretic peptide (BNP): 20.8 pg/mL (ref 0–125)

## 2012-05-12 LAB — POCT I-STAT TROPONIN I: Troponin i, poc: 0.01 ng/mL (ref 0.00–0.08)

## 2012-05-12 MED ORDER — IPRATROPIUM BROMIDE 0.02 % IN SOLN
0.5000 mg | Freq: Once | RESPIRATORY_TRACT | Status: AC
  Administered 2012-05-12: 0.5 mg via RESPIRATORY_TRACT
  Filled 2012-05-12: qty 2.5

## 2012-05-12 MED ORDER — NITROGLYCERIN 2 % TD OINT
1.0000 [in_us] | TOPICAL_OINTMENT | Freq: Once | TRANSDERMAL | Status: AC
  Administered 2012-05-12: 1 [in_us] via TOPICAL
  Filled 2012-05-12: qty 1

## 2012-05-12 MED ORDER — OXYCODONE-ACETAMINOPHEN 5-325 MG PO TABS
1.0000 | ORAL_TABLET | Freq: Once | ORAL | Status: AC
  Administered 2012-05-12: 1 via ORAL
  Filled 2012-05-12: qty 1

## 2012-05-12 MED ORDER — ASPIRIN 81 MG PO CHEW
324.0000 mg | CHEWABLE_TABLET | Freq: Once | ORAL | Status: AC
  Administered 2012-05-12: 324 mg via ORAL
  Filled 2012-05-12: qty 4

## 2012-05-12 MED ORDER — OXYCODONE-ACETAMINOPHEN 5-325 MG PO TABS: ORAL_TABLET | ORAL | Status: DC

## 2012-05-12 MED ORDER — ALBUTEROL SULFATE (5 MG/ML) 0.5% IN NEBU
2.5000 mg | INHALATION_SOLUTION | Freq: Once | RESPIRATORY_TRACT | Status: AC
  Administered 2012-05-12: 2.5 mg via RESPIRATORY_TRACT
  Filled 2012-05-12: qty 0.5

## 2012-05-12 NOTE — ED Notes (Signed)
Pt c/o chest pain across entire chest since she was in church yesterday, pain remains constant since onset and nothing relieves the pain. States she has felt SOB since the pain started. A&Ox4, pt is able to speak in full sentences now.

## 2012-05-12 NOTE — ED Provider Notes (Signed)
62 year old female has been having episodic central chest pain for the last one to 2 days. Pain waxes and wanes and is associated with dyspnea nausea but not vomiting or diaphoresis. She's not able to state anything making the pain better or worse. On exam, she appears somewhat anxious. No neck vein distention is seen. Lungs have rales at the left base and faint expiratory wheezes. Heart is regular rate and rhythm. His no chest wall tenderness. Extremities have no cyanosis or edema. Old records are reviewed and she had been admitted in October of 2013 for chest pain and had a negative Myoview scan at that time. She does have a history of cardiac stents and last catheterization in January of 2013 had shown patent stents. I feel that this is unlikely to be an acute coronary syndrome, but her cardiologist will be consulted to consider admitting her for serial bone ends and possible repeat Myoview study.   Date: 05/12/2012  Rate: 86  Rhythm: normal sinus rhythm  QRS Axis: normal  Intervals: normal  ST/T Wave abnormalities: normal  Conduction Disutrbances:none  Narrative Interpretation: Poor R-wave progression across the precordium. When compared with ECG of 02/03/2012, no significant changes are seen.  Old EKG Reviewed: unchanged  Medical screening examination/treatment/procedure(s) were conducted as a shared visit with non-physician practitioner(s) and myself.  I personally evaluated the patient during the encounter   Delora Fuel, MD [phone] [id number]

## 2012-05-12 NOTE — ED Provider Notes (Signed)
History     CSN: [id number]  Arrival date & time 05/12/12  22   First MD Initiated Contact with Patient 05/12/12 1250      Chief Complaint  Patient presents with  . Chest Pain    (Consider location/radiation/quality/duration/timing/severity/associated sxs/prior treatment) HPI  [name] is a 62 y.o. female complaining of diffuse, anterior chest pain starting yesterday while she was sitting in church. Pain is constant and is associated with shortness of breath. Patient rates her pain at 5/10. Patient took 3 sublingual mitral glycerin tablets with no relief in the pain. PTCA stenting to right coronary artery and left circumflex in the past. Pharmacologic stress test in October of 2013 shows abnormal lateral wall motion with EF of 62%, no signs of ischemia.  Cath in Jan. 2013 Showed patent Stents  PCP Harwani   Past Medical History  Diagnosis Date  . History of colonic polyps   . GERD (gastroesophageal reflux disease)   . Hyperlipidemia   . Diabetes mellitus without mention of complication   . Hypertension   . Myocardial infarction   . Coronary artery disease   . Fatty liver disease, nonalcoholic   . Cholelithiasis   . Iron deficiency anemia   . Angina   . Shortness of breath   . Asthma   . Arthritis     HANDS"    Past Surgical History  Procedure Laterality Date  . Right colectomy      After colonic perforation  . Tubal ligation    . Coronary angioplasty with stent placement    . Esophagogastroduodenoscopy  11/30/2011    Procedure: ESOPHAGOGASTRODUODENOSCOPY (EGD);  Surgeon: Lafayette Dragon, MD;  Location: [facility];  Service: Endoscopy;  Laterality: N/A;  . Colonoscopy  11/30/2011    Procedure: COLONOSCOPY;  Surgeon: Lafayette Dragon, MD;  Location: [facility];  Service: Endoscopy;  Laterality: N/A;    Family History  Problem Relation Age of Onset  . Heart disease Mother   . Diabetes Mother   . Colon cancer Neg Hx   . Cancer Father     unsure what kind   . Diabetes Sister     History  Substance Use Topics  . Smoking status: Former Smoker -- 0.50 packs/day for 47 years    Types: Cigarettes    Quit date: 05/09/2010  . Smokeless tobacco: Never Used  . Alcohol Use: No    OB History   Grav Para Term Preterm Abortions TAB SAB Ect Mult Living                  Review of Systems  Constitutional: Negative for fever.  Respiratory: Negative for shortness of breath.   Cardiovascular: Positive for chest pain.  Gastrointestinal: Negative for nausea, vomiting, abdominal pain and diarrhea.  All other systems reviewed and are negative.    Allergies  Review of patient's allergies indicates no known allergies.  Home Medications   Current Outpatient Rx  Name  Route  Sig  Dispense  Refill  . albuterol (PROVENTIL HFA;VENTOLIN HFA) 108 (90 BASE) MCG/ACT inhaler   Inhalation   Inhale 1-2 puffs into the lungs every 6 (six) hours as needed for wheezing or shortness of breath.   1 Inhaler   0   . ALPRAZolam (XANAX) 0.25 MG tablet   Oral   Take 0.25 mg by mouth at bedtime as needed. For sleep         . amLODipine (NORVASC) 5 MG tablet   Oral   Take  1 tablet (5 mg total) by mouth daily.   30 tablet   0   . aspirin EC 81 MG EC tablet   Oral   Take 1 tablet (81 mg total) by mouth daily.   30 tablet   0   . budesonide-formoterol (SYMBICORT) 160-4.5 MCG/ACT inhaler   Inhalation   Inhale 2 puffs into the lungs 2 (two) times daily.   1 Inhaler   0   . famotidine (PEPCID) 20 MG tablet   Oral   Take 1 tablet (20 mg total) by mouth 2 (two) times daily.   60 tablet   0   . metFORMIN (GLUCOPHAGE) 500 MG tablet   Oral   Take 1 tablet (500 mg total) by mouth 2 (two) times daily with a meal.   60 tablet   0   . nitroGLYCERIN (NITROSTAT) 0.4 MG SL tablet   Sublingual   Place 1 tablet (0.4 mg total) under the tongue every 5 (five) minutes as needed. For chest pain   10 tablet   0     BP 153/78  Pulse 85  Temp(Src) 98 F  (36.7 C) (Oral)  Resp 18  SpO2 100%  Physical Exam  Nursing note and vitals reviewed. Constitutional: She is oriented to person, place, and time. She appears well-developed and well-nourished. No distress.  HENT:  Head: Normocephalic and atraumatic.  Mouth/Throat: Oropharynx is clear and moist.  Eyes: Conjunctivae and EOM are normal. Pupils are equal, round, and reactive to light.  Neck: Normal range of motion. Neck supple. No JVD present.  Cardiovascular: Normal rate, regular rhythm and intact distal pulses.   Pulmonary/Chest: Effort normal and breath sounds normal. No stridor. No respiratory distress. She has no wheezes. She has no rales. She exhibits no tenderness.  Abdominal: Soft. Bowel sounds are normal. She exhibits no distension and no mass. There is no tenderness. There is no rebound and no guarding.  Musculoskeletal: Normal range of motion. She exhibits no edema.  Neurological: She is alert and oriented to person, place, and time.  Skin: Skin is warm.  Psychiatric: She has a normal mood and affect.    ED Course  Procedures (including critical care time)  Labs Reviewed  CBC - Abnormal; Notable for the following:    Hemoglobin 9.0 (*)    HCT 30.2 (*)    MCV 74.2 (*)    MCH 22.1 (*)    MCHC 29.8 (*)    RDW 17.5 (*)    Platelets 411 (*)    All other components within normal limits  BASIC METABOLIC PANEL - Abnormal; Notable for the following:    Glucose, Bld 151 (*)    GFR calc non Af Amer 68 (*)    GFR calc Af Amer 79 (*)    All other components within normal limits  PRO B NATRIURETIC PEPTIDE  D-DIMER, QUANTITATIVE  POCT I-STAT TROPONIN I   Dg Chest 2 View  05/12/2012  *RADIOLOGY REPORT*  Clinical Data: Mid chest pain with cough and congestion for 2 days.  CHEST - 2 VIEW  Comparison: 02/03/2012 and 11/20/2011.  Findings: The heart size and mediastinal contours are stable. There is aortic atherosclerosis.  There is mild linear atelectasis or scarring at the left lung  base.  There is no confluent airspace opacity, edema or pleural effusion. The osseous structures appear normal.  IMPRESSION: Mild linear left lower lobe atelectasis or scarring.  No acute cardiopulmonary process.   Original Report Authenticated By: Richardean Sale, M.D.  Medications  oxyCODONE-acetaminophen (PERCOCET/ROXICET) 5-325 MG per tablet 1 tablet (not administered)  nitroGLYCERIN (NITROGLYN) 2 % ointment 1 inch (1 inch Topical Given 05/12/12 1402)  aspirin chewable tablet 324 mg (324 mg Oral Given 05/12/12 1401)  albuterol (PROVENTIL) (5 MG/ML) 0.5% nebulizer solution 2.5 mg (2.5 mg Nebulization Given 05/12/12 1604)  ipratropium (ATROVENT) nebulizer solution 0.5 mg (0.5 mg Nebulization Given 05/12/12 1604)     Date: 05/12/2012  Rate: 86  Rhythm: normal sinus rhythm  QRS Axis: left  Intervals: normal  ST/T Wave abnormalities: nonspecific T wave changes  Conduction Disutrbances:none  Narrative Interpretation:   Old EKG Reviewed: unchanged    1. Chest pain   2. SOB (shortness of breath)       MDM   [name] is a 62 y.o. female with past medical history of MI and 2 stents presenting with diffuse chest pain and shortness of breath for the last 24 hours. EKG is nonischemic, troponin is negative, patient has anemia that is not atypical for her baseline with a hemoglobin of 9 hematocrit of 30.2. No electrolyte abnormalities and BNP is normal at 20.8. Chest x-ray shows no acute abnormalities.  This is a shared visit with attending Dr. Roxanne Mins.  Cardiology consult from Dr. Terrence Dupont appreciated: He recommends obtaining a d-dimer and have the patient followup in his office as an outpatient.  4:24 PM is with Dr. won a granite informed him that her d-dimer has resulted negative. So the patient is still having chest pain and shortness of breath and have requested that he come to evaluate the patient. He stated he this patient is well-known to him and that there is an element of anxiety to  her chest pain exacerbations. He feels very comfortable with me discharging her and he has stated that he will see her in the office tomorrow morning.  I have discussed this with the patient and is amenable to outpatient followup at this time.   Filed Vitals:   05/12/12 1245 05/12/12 1404 05/12/12 1415 05/12/12 1430  BP: 153/78 139/79 136/59 143/83  Pulse: 85 85 79 78  Temp: 98 F (36.7 C)     TempSrc: Oral     Resp: 18  20 17   SpO2: 100%  100% 100%     Pt verbalized understanding and agrees with care plan. Outpatient follow-up and return precautions given.    New Prescriptions   OXYCODONE-ACETAMINOPHEN (PERCOCET/ROXICET) 5-325 MG PER TABLET    1 to 2 tabs PO q6hrs  PRN for pain           Monico Blitz, PA-C 05/12/12 1630

## 2012-05-13 ENCOUNTER — Other Ambulatory Visit (HOSPITAL_COMMUNITY): Payer: Self-pay | Admitting: Cardiology

## 2012-05-13 DIAGNOSIS — R079 Chest pain, unspecified: Secondary | ICD-10-CM

## 2012-05-14 ENCOUNTER — Other Ambulatory Visit: Payer: Self-pay | Admitting: Gastroenterology

## 2012-05-21 ENCOUNTER — Encounter (HOSPITAL_COMMUNITY)
Admission: RE | Admit: 2012-05-21 | Discharge: 2012-05-21 | Disposition: A | Discharge status: Home / Self Care | Payer: Medicaid Other | Source: Ambulatory Visit | Attending: Cardiology | Admitting: Cardiology

## 2012-05-21 ENCOUNTER — Other Ambulatory Visit: Payer: Self-pay

## 2012-05-21 DIAGNOSIS — R0602 Shortness of breath: Secondary | ICD-10-CM | POA: Insufficient documentation

## 2012-05-21 DIAGNOSIS — E119 Type 2 diabetes mellitus without complications: Secondary | ICD-10-CM | POA: Insufficient documentation

## 2012-05-21 DIAGNOSIS — I2510 Atherosclerotic heart disease of native coronary artery without angina pectoris: Secondary | ICD-10-CM | POA: Insufficient documentation

## 2012-05-21 DIAGNOSIS — R079 Chest pain, unspecified: Secondary | ICD-10-CM | POA: Insufficient documentation

## 2012-05-21 DIAGNOSIS — I10 Essential (primary) hypertension: Secondary | ICD-10-CM | POA: Insufficient documentation

## 2012-05-21 DIAGNOSIS — I252 Old myocardial infarction: Secondary | ICD-10-CM | POA: Insufficient documentation

## 2012-05-21 MED ORDER — TECHNETIUM TC 99M SESTAMIBI GENERIC - CARDIOLITE
30.0000 | Freq: Once | INTRAVENOUS | Status: AC | PRN
  Administered 2012-05-21: 30 via INTRAVENOUS

## 2012-05-21 MED ORDER — LEVALBUTEROL HCL 1.25 MG/0.5ML IN NEBU
1.2500 mg | INHALATION_SOLUTION | Freq: Once | RESPIRATORY_TRACT | Status: AC
  Administered 2012-05-21: 1.25 mg via RESPIRATORY_TRACT

## 2012-05-21 MED ORDER — REGADENOSON 0.4 MG/5ML IV SOLN
INTRAVENOUS | Status: AC
  Filled 2012-05-21: qty 5

## 2012-05-21 MED ORDER — TECHNETIUM TC 99M SESTAMIBI GENERIC - CARDIOLITE
10.0000 | Freq: Once | INTRAVENOUS | Status: AC | PRN
  Administered 2012-05-21: 10 via INTRAVENOUS

## 2012-05-21 MED ORDER — LEVALBUTEROL HCL 1.25 MG/0.5ML IN NEBU
1.2500 mg | INHALATION_SOLUTION | Freq: Once | RESPIRATORY_TRACT | Status: DC
  Filled 2012-05-21: qty 0.5

## 2012-05-21 MED ORDER — REGADENOSON 0.4 MG/5ML IV SOLN
0.4000 mg | Freq: Once | INTRAVENOUS | Status: AC
  Administered 2012-05-21: 0.4 mg via INTRAVENOUS

## 2012-07-06 ENCOUNTER — Emergency Department (HOSPITAL_COMMUNITY)
Admission: EM | Admit: 2012-07-06 | Discharge: 2012-07-07 | Disposition: A | Discharge status: Home / Self Care | Payer: Medicaid Other | Attending: Emergency Medicine | Admitting: Emergency Medicine

## 2012-07-06 ENCOUNTER — Encounter (HOSPITAL_COMMUNITY): Payer: Self-pay

## 2012-07-06 DIAGNOSIS — Z7982 Long term (current) use of aspirin: Secondary | ICD-10-CM | POA: Insufficient documentation

## 2012-07-06 DIAGNOSIS — I10 Essential (primary) hypertension: Secondary | ICD-10-CM | POA: Insufficient documentation

## 2012-07-06 DIAGNOSIS — Z794 Long term (current) use of insulin: Secondary | ICD-10-CM | POA: Insufficient documentation

## 2012-07-06 DIAGNOSIS — F419 Anxiety disorder, unspecified: Secondary | ICD-10-CM

## 2012-07-06 DIAGNOSIS — G8929 Other chronic pain: Secondary | ICD-10-CM | POA: Insufficient documentation

## 2012-07-06 DIAGNOSIS — Z8739 Personal history of other diseases of the musculoskeletal system and connective tissue: Secondary | ICD-10-CM | POA: Insufficient documentation

## 2012-07-06 DIAGNOSIS — J45901 Unspecified asthma with (acute) exacerbation: Secondary | ICD-10-CM | POA: Insufficient documentation

## 2012-07-06 DIAGNOSIS — I252 Old myocardial infarction: Secondary | ICD-10-CM | POA: Insufficient documentation

## 2012-07-06 DIAGNOSIS — I2510 Atherosclerotic heart disease of native coronary artery without angina pectoris: Secondary | ICD-10-CM | POA: Insufficient documentation

## 2012-07-06 DIAGNOSIS — Z860100 Personal history of colon polyps, unspecified: Secondary | ICD-10-CM | POA: Insufficient documentation

## 2012-07-06 DIAGNOSIS — Z8719 Personal history of other diseases of the digestive system: Secondary | ICD-10-CM | POA: Insufficient documentation

## 2012-07-06 DIAGNOSIS — E119 Type 2 diabetes mellitus without complications: Secondary | ICD-10-CM | POA: Insufficient documentation

## 2012-07-06 DIAGNOSIS — J441 Chronic obstructive pulmonary disease with (acute) exacerbation: Secondary | ICD-10-CM | POA: Insufficient documentation

## 2012-07-06 DIAGNOSIS — R0602 Shortness of breath: Secondary | ICD-10-CM | POA: Insufficient documentation

## 2012-07-06 DIAGNOSIS — E785 Hyperlipidemia, unspecified: Secondary | ICD-10-CM | POA: Insufficient documentation

## 2012-07-06 DIAGNOSIS — K219 Gastro-esophageal reflux disease without esophagitis: Secondary | ICD-10-CM | POA: Insufficient documentation

## 2012-07-06 DIAGNOSIS — K7689 Other specified diseases of liver: Secondary | ICD-10-CM | POA: Insufficient documentation

## 2012-07-06 DIAGNOSIS — Z9861 Coronary angioplasty status: Secondary | ICD-10-CM | POA: Insufficient documentation

## 2012-07-06 DIAGNOSIS — F411 Generalized anxiety disorder: Secondary | ICD-10-CM | POA: Insufficient documentation

## 2012-07-06 DIAGNOSIS — R0789 Other chest pain: Secondary | ICD-10-CM | POA: Insufficient documentation

## 2012-07-06 DIAGNOSIS — Z862 Personal history of diseases of the blood and blood-forming organs and certain disorders involving the immune mechanism: Secondary | ICD-10-CM | POA: Insufficient documentation

## 2012-07-06 DIAGNOSIS — Z87442 Personal history of urinary calculi: Secondary | ICD-10-CM | POA: Insufficient documentation

## 2012-07-06 DIAGNOSIS — F1410 Cocaine abuse, uncomplicated: Secondary | ICD-10-CM

## 2012-07-06 DIAGNOSIS — I209 Angina pectoris, unspecified: Secondary | ICD-10-CM | POA: Insufficient documentation

## 2012-07-06 DIAGNOSIS — Z79899 Other long term (current) drug therapy: Secondary | ICD-10-CM | POA: Insufficient documentation

## 2012-07-06 DIAGNOSIS — Z87891 Personal history of nicotine dependence: Secondary | ICD-10-CM | POA: Insufficient documentation

## 2012-07-06 HISTORY — DX: Cocaine abuse, uncomplicated: F14.10

## 2012-07-06 LAB — POCT I-STAT, CHEM 8
BUN: 8 mg/dL (ref 6–23)
Calcium, Ion: 1.21 mmol/L (ref 1.13–1.30)
Chloride: 107 mEq/L (ref 96–112)
Creatinine, Ser: 1.2 mg/dL — ABNORMAL HIGH (ref 0.50–1.10)
Glucose, Bld: 110 mg/dL — ABNORMAL HIGH (ref 70–99)
TCO2: 25 mmol/L (ref 0–100)

## 2012-07-06 MED ORDER — NITROGLYCERIN 2 % TD OINT
0.5000 [in_us] | TOPICAL_OINTMENT | Freq: Once | TRANSDERMAL | Status: AC
  Administered 2012-07-07: 0.5 [in_us] via TOPICAL
  Filled 2012-07-06: qty 1

## 2012-07-06 MED ORDER — ONDANSETRON HCL 4 MG/2ML IJ SOLN
4.0000 mg | Freq: Once | INTRAMUSCULAR | Status: AC
  Administered 2012-07-07: 4 mg via INTRAVENOUS
  Filled 2012-07-06: qty 2

## 2012-07-06 MED ORDER — HYDROMORPHONE HCL PF 1 MG/ML IJ SOLN
0.5000 mg | INTRAMUSCULAR | Status: AC
  Administered 2012-07-07: 0.5 mg via INTRAVENOUS
  Filled 2012-07-06: qty 1

## 2012-07-06 MED ORDER — LORAZEPAM 2 MG/ML IJ SOLN
0.5000 mg | Freq: Once | INTRAMUSCULAR | Status: AC
  Administered 2012-07-07: 0.5 mg via INTRAVENOUS
  Filled 2012-07-06: qty 1

## 2012-07-06 MED ORDER — GI COCKTAIL ~~LOC~~
30.0000 mL | Freq: Once | ORAL | Status: AC
  Administered 2012-07-07: 30 mL via ORAL
  Filled 2012-07-06: qty 30

## 2012-07-06 NOTE — ED Notes (Signed)
Pt reporting left sided chest pain with no radiation that started at 4pm today after eating a pork chop. Pt describes pain as pressure.  Pt was anxious with EMS and sts she is under a lot of stress. Recently had a stress test which did not show any new findings.  Pt had 3 nitros at home PTA and 1 with EMS with no relief.  Pt given 324 of ASA with EMS.

## 2012-07-06 NOTE — ED Provider Notes (Signed)
History     CSN: [id number]  Arrival date & time 07/06/12  2251   First MD Initiated Contact with Patient 07/06/12 2311      Chief Complaint  Patient presents with  . Chest Pain     HPI [name] is a 62 y.o. female presents with chest pain. Patient does have a long history of chest pain which is thought to be related to anxiety, however patient also has a history of coronary artery disease she is status post stenting to the RCA and left circumflex in the past, last catheterization was in January of 2013 showing patent stents and no new coronary artery disease requiring intervention. Approximately 1400 after eating a pork chop, she started having some chest pain on the left side of the chest, she feels it's like a "pressing on it", heaviness, she said it started while she was laying down, she took 4 nitroglycerin total, she's had some associated shortness of breath (she typically has some shortness of breath secondary to COPD and continuing to smoke a few cigarettes daily), she's also had some cough productive of white sputum however this is not much different than usual. She says also she's had some associated dizziness but this seems to be a chronic problem as it's exacerbated by changing position, she also relates 3 falls over the last couple of weeks x2 indication and once in the bathroom without injuries. Patient currently lives at home with her son who is 59 years old and her grandson who is 72 years old-grandson is "there all the time."  Denies any abdominal pain, vomiting, diarrhea, fevers or chills. Patient's physicians Dr. Terrence Dupont Past Medical History  Diagnosis Date  . History of colonic polyps   . GERD (gastroesophageal reflux disease)   . Hyperlipidemia   . Diabetes mellitus without mention of complication   . Hypertension   . Myocardial infarction   . Coronary artery disease   . Fatty liver disease, nonalcoholic   . Cholelithiasis   . Iron deficiency anemia   . Angina    . Shortness of breath   . Asthma   . Arthritis     HANDS"    Past Surgical History  Procedure Laterality Date  . Right colectomy      After colonic perforation  . Tubal ligation    . Coronary angioplasty with stent placement    . Esophagogastroduodenoscopy  11/30/2011    Procedure: ESOPHAGOGASTRODUODENOSCOPY (EGD);  Surgeon: Lafayette Dragon, MD;  Location: [facility];  Service: Endoscopy;  Laterality: N/A;  . Colonoscopy  11/30/2011    Procedure: COLONOSCOPY;  Surgeon: Lafayette Dragon, MD;  Location: [facility];  Service: Endoscopy;  Laterality: N/A;    Family History  Problem Relation Age of Onset  . Heart disease Mother   . Diabetes Mother   . Colon cancer Neg Hx   . Cancer Father     unsure what kind  . Diabetes Sister     History  Substance Use Topics  . Smoking status: Former Smoker -- 0.50 packs/day for 47 years    Types: Cigarettes    Quit date: 05/09/2010  . Smokeless tobacco: Never Used  . Alcohol Use: No    OB History   Grav Para Term Preterm Abortions TAB SAB Ect Mult Living                  Review of Systems At least 10pt or greater review of systems completed and are negative except where specified  in the HPI.  Allergies  Review of patient's allergies indicates no known allergies.  Home Medications   Current Outpatient Rx  Name  Route  Sig  Dispense  Refill  . albuterol (PROVENTIL HFA;VENTOLIN HFA) 108 (90 BASE) MCG/ACT inhaler   Inhalation   Inhale 1-2 puffs into the lungs every 6 (six) hours as needed for wheezing or shortness of breath.   1 Inhaler   0   . ALPRAZolam (XANAX) 0.25 MG tablet   Oral   Take 0.25 mg by mouth 2 (two) times daily as needed. For sleep         . amLODipine (NORVASC) 5 MG tablet   Oral   Take 1 tablet (5 mg total) by mouth daily.   30 tablet   0   . aspirin EC 81 MG EC tablet   Oral   Take 1 tablet (81 mg total) by mouth daily.   30 tablet   0   . atorvastatin (LIPITOR) 20 MG tablet   Oral   Take  20 mg by mouth daily.         . carvedilol (COREG) 6.25 MG tablet   Oral   Take 3.125 mg by mouth 2 (two) times daily with a meal.         . famotidine (PEPCID) 20 MG tablet   Oral   Take 1 tablet (20 mg total) by mouth 2 (two) times daily.   60 tablet   0   . insulin glargine (LANTUS) 100 UNIT/ML injection   Subcutaneous   Inject 40 Units into the skin 2 (two) times daily.         . metFORMIN (GLUCOPHAGE) 500 MG tablet   Oral   Take 1 tablet (500 mg total) by mouth 2 (two) times daily with a meal.   60 tablet   0   . metoprolol succinate (TOPROL-XL) 50 MG 24 hr tablet   Oral   Take 50 mg by mouth at bedtime. Take with or immediately following a meal.         . nitroGLYCERIN (NITROSTAT) 0.4 MG SL tablet   Sublingual   Place 1 tablet (0.4 mg total) under the tongue every 5 (five) minutes as needed. For chest pain   10 tablet   0   . pantoprazole (PROTONIX) 40 MG tablet   Oral   Take 40 mg by mouth daily.           BP 162/71  Temp(Src) 99.2 F (37.3 C) (Oral)  Resp 19  SpO2 100%  Physical Exam  Nursing notes reviewed.  Electronic medical record reviewed. VITAL SIGNS:   Filed Vitals:   07/06/12 2254 07/07/12 0018  BP: 162/71 136/77  Pulse:  82  Temp: 99.2 F (37.3 C)   TempSrc: Oral   Resp: 19 16  SpO2: 100% 100%   CONSTITUTIONAL: Awake, oriented, appears non-toxic, appears anxious and uncomfortable, tearful HENT: Atraumatic, normocephalic, oral mucosa pink and moist, airway patent. Nares patent without drainage. External ears normal. EYES: Conjunctiva clear, EOMI, PERRLA NECK: Trachea midline, non-tender, supple CARDIOVASCULAR: Normal heart rate, Normal rhythm, soft systolic murmur PULMONARY/CHEST: Bilateral rales at the base, faint to mild, not resolved with cough. No wheezing or rhonchi. Symmetrical breath sounds. Non-tender. ABDOMINAL: Non-distended, soft, non-tender - no rebound or guarding.  BS normal. NEUROLOGIC: Non-focal, moving all  four extremities, no gross sensory or motor deficits. EXTREMITIES: No clubbing, cyanosis, or edema SKIN: Warm, Dry, No erythema, No rash   ED Course  Procedures (including critical care time)  Date: 07/07/2012  Rate: 91  Rhythm: normal sinus rhythm  QRS Axis: Left  Intervals: normal  ST/T Wave abnormalities: normal  Conduction Disutrbances: none  Narrative Interpretation: CBC, morphology of EKG is unremarkable compared to prior EKG from 05/12/2012. No indicators of acute ischemia or infarction   Labs Reviewed  POCT I-STAT, CHEM 8 - Abnormal; Notable for the following:    Creatinine, Ser 1.20 (*)    Glucose, Bld 110 (*)    Hemoglobin 10.9 (*)    HCT 32.0 (*)    All other components within normal limits  D-DIMER, QUANTITATIVE  POCT I-STAT TROPONIN I  POCT I-STAT TROPONIN I   Dg Chest 2 View  07/07/2012   *RADIOLOGY REPORT*  Clinical Data: Chest pain for 8 hours  CHEST - 2 VIEW  Comparison: 05/12/2012  Findings: Cardiac leads overlie the chest.  Heart size is at upper limits of normal.  Lung volumes are low but clear.  No pleural effusion.  No acute osseous finding. Aortic atherosclerotic calcification noted.  IMPRESSION: No acute cardiopulmonary process.   Original Report Authenticated By: Conchita Paris, M.D.     1. Chronic chest pain   2. Anxiety       MDM  [name] is a 62 y.o. female who presents with chest pain and some shortness of breath after he worked up, she says initially she indigestion but it did not go away. She describes the pain as pressure and similar to pain she's had in the past. She does have issues with anxiety. Her primary care physician Dr. Terrence Dupont has documented multiple episodes of chest pain he thinks is related to anxiety.  Patient is anxious and is breathing shallowly and quickly, her lungs are clear bilaterally, x-ray is unremarkable. Patient has had relief with Ativan and pain medicine  Patient's workup is unremarkable, she's had 2 negative  troponins 112 hours after the onset of her chest pain which are expected to be positive in any acute coronary syndrome, I do not think her chest pain is related to her heart. She has a documented negative catheterization from January 2013 and a negative Lexiscan stress test from October 2013.  I discussed this patient's case with Dr. Terrence Dupont will see the patient tomorrow in the office. He stated he this patient is well-known to him and that anxiety likely plays a role in her chest pains.  Patient discharged home stable and good condition.       Rhunette Croft, MD 07/07/12 [phone]

## 2012-07-07 ENCOUNTER — Emergency Department (HOSPITAL_COMMUNITY): Payer: Medicaid Other

## 2012-07-07 LAB — POCT I-STAT TROPONIN I: Troponin i, poc: 0 ng/mL (ref 0.00–0.08)

## 2012-07-07 MED ORDER — ACETAMINOPHEN 650 MG RE SUPP: 650.0000 mg | Freq: Once | RECTAL | Status: DC

## 2012-07-07 MED ORDER — KETAMINE HCL 10 MG/ML IJ SOLN
6.0000 mg | Freq: Once | INTRAMUSCULAR | Status: DC
  Filled 2012-07-07: qty 0.6

## 2012-07-07 MED ORDER — SODIUM CHLORIDE 0.9 % IV BOLUS (SEPSIS)
500.0000 mL | Freq: Once | INTRAVENOUS | Status: AC
  Administered 2012-07-07: 500 mL via INTRAVENOUS

## 2012-07-07 MED ORDER — SODIUM CHLORIDE 0.9 % IV SOLN
Freq: Once | INTRAVENOUS | Status: AC
  Administered 2012-07-07: 02:00:00 via INTRAVENOUS

## 2012-07-07 NOTE — ED Notes (Signed)
Patient states she can't leave right now.   Patient states that she has no ride home.  Patient did call a ride and is waiting on a callback.  I told patient I'd come back in 30 minutes to check on her.

## 2012-07-07 NOTE — ED Notes (Signed)
nss not running even on the pump

## 2012-07-07 NOTE — ED Notes (Signed)
Ambulated patient.   Patient tolerated well.

## 2012-08-02 ENCOUNTER — Encounter (HOSPITAL_COMMUNITY): Payer: Self-pay | Admitting: *Deleted

## 2012-08-02 ENCOUNTER — Emergency Department (HOSPITAL_COMMUNITY): Payer: Medicaid Other

## 2012-08-02 ENCOUNTER — Inpatient Hospital Stay (HOSPITAL_COMMUNITY)
Admission: EM | Admit: 2012-08-02 | Discharge: 2012-08-04 | DRG: 093 | Disposition: A | Discharge status: Home / Self Care | Payer: Medicaid Other | Attending: Internal Medicine | Admitting: Internal Medicine

## 2012-08-02 DIAGNOSIS — F32A Depression, unspecified: Secondary | ICD-10-CM | POA: Diagnosis present

## 2012-08-02 DIAGNOSIS — D509 Iron deficiency anemia, unspecified: Secondary | ICD-10-CM

## 2012-08-02 DIAGNOSIS — I219 Acute myocardial infarction, unspecified: Secondary | ICD-10-CM | POA: Diagnosis present

## 2012-08-02 DIAGNOSIS — G929 Unspecified toxic encephalopathy: Principal | ICD-10-CM | POA: Diagnosis present

## 2012-08-02 DIAGNOSIS — R079 Chest pain, unspecified: Secondary | ICD-10-CM

## 2012-08-02 DIAGNOSIS — T43695A Adverse effect of other psychostimulants, initial encounter: Secondary | ICD-10-CM | POA: Diagnosis present

## 2012-08-02 DIAGNOSIS — Z634 Disappearance and death of family member: Secondary | ICD-10-CM

## 2012-08-02 DIAGNOSIS — E538 Deficiency of other specified B group vitamins: Secondary | ICD-10-CM | POA: Diagnosis present

## 2012-08-02 DIAGNOSIS — E785 Hyperlipidemia, unspecified: Secondary | ICD-10-CM

## 2012-08-02 DIAGNOSIS — G92 Toxic encephalopathy: Principal | ICD-10-CM | POA: Diagnosis present

## 2012-08-02 DIAGNOSIS — I252 Old myocardial infarction: Secondary | ICD-10-CM

## 2012-08-02 DIAGNOSIS — E119 Type 2 diabetes mellitus without complications: Secondary | ICD-10-CM | POA: Diagnosis present

## 2012-08-02 DIAGNOSIS — F329 Major depressive disorder, single episode, unspecified: Secondary | ICD-10-CM | POA: Diagnosis present

## 2012-08-02 DIAGNOSIS — Z87891 Personal history of nicotine dependence: Secondary | ICD-10-CM

## 2012-08-02 DIAGNOSIS — R0609 Other forms of dyspnea: Secondary | ICD-10-CM | POA: Diagnosis present

## 2012-08-02 DIAGNOSIS — Z860100 Personal history of colon polyps, unspecified: Secondary | ICD-10-CM

## 2012-08-02 DIAGNOSIS — M129 Arthropathy, unspecified: Secondary | ICD-10-CM | POA: Diagnosis present

## 2012-08-02 DIAGNOSIS — R45851 Suicidal ideations: Secondary | ICD-10-CM

## 2012-08-02 DIAGNOSIS — R0989 Other specified symptoms and signs involving the circulatory and respiratory systems: Secondary | ICD-10-CM | POA: Diagnosis present

## 2012-08-02 DIAGNOSIS — E1122 Type 2 diabetes mellitus with diabetic chronic kidney disease: Secondary | ICD-10-CM | POA: Diagnosis present

## 2012-08-02 DIAGNOSIS — D126 Benign neoplasm of colon, unspecified: Secondary | ICD-10-CM

## 2012-08-02 DIAGNOSIS — G9340 Encephalopathy, unspecified: Secondary | ICD-10-CM

## 2012-08-02 DIAGNOSIS — I2510 Atherosclerotic heart disease of native coronary artery without angina pectoris: Secondary | ICD-10-CM | POA: Diagnosis present

## 2012-08-02 DIAGNOSIS — K7689 Other specified diseases of liver: Secondary | ICD-10-CM | POA: Diagnosis present

## 2012-08-02 DIAGNOSIS — I10 Essential (primary) hypertension: Secondary | ICD-10-CM | POA: Diagnosis present

## 2012-08-02 DIAGNOSIS — K219 Gastro-esophageal reflux disease without esophagitis: Secondary | ICD-10-CM | POA: Diagnosis present

## 2012-08-02 DIAGNOSIS — F3289 Other specified depressive episodes: Secondary | ICD-10-CM | POA: Diagnosis present

## 2012-08-02 DIAGNOSIS — E118 Type 2 diabetes mellitus with unspecified complications: Secondary | ICD-10-CM | POA: Diagnosis present

## 2012-08-02 DIAGNOSIS — R05 Cough: Secondary | ICD-10-CM

## 2012-08-02 DIAGNOSIS — R195 Other fecal abnormalities: Secondary | ICD-10-CM

## 2012-08-02 DIAGNOSIS — F1010 Alcohol abuse, uncomplicated: Secondary | ICD-10-CM | POA: Diagnosis present

## 2012-08-02 DIAGNOSIS — Z794 Long term (current) use of insulin: Secondary | ICD-10-CM

## 2012-08-02 DIAGNOSIS — Z9861 Coronary angioplasty status: Secondary | ICD-10-CM

## 2012-08-02 DIAGNOSIS — D649 Anemia, unspecified: Secondary | ICD-10-CM

## 2012-08-02 DIAGNOSIS — J449 Chronic obstructive pulmonary disease, unspecified: Secondary | ICD-10-CM | POA: Diagnosis present

## 2012-08-02 DIAGNOSIS — J4489 Other specified chronic obstructive pulmonary disease: Secondary | ICD-10-CM | POA: Diagnosis present

## 2012-08-02 LAB — URINALYSIS, ROUTINE W REFLEX MICROSCOPIC
Nitrite: NEGATIVE
Specific Gravity, Urine: 1.015 (ref 1.005–1.030)
Urobilinogen, UA: 1 mg/dL (ref 0.0–1.0)

## 2012-08-02 LAB — COMPREHENSIVE METABOLIC PANEL
ALT: 11 U/L (ref 0–35)
CO2: 25 mEq/L (ref 19–32)
Calcium: 9.5 mg/dL (ref 8.4–10.5)
Creatinine, Ser: 1.09 mg/dL (ref 0.50–1.10)
GFR calc Af Amer: 61 mL/min — ABNORMAL LOW (ref >90)
GFR calc non Af Amer: 52 mL/min — ABNORMAL LOW (ref >90)
Glucose, Bld: 119 mg/dL — ABNORMAL HIGH (ref 70–99)

## 2012-08-02 LAB — GLUCOSE, CAPILLARY
Glucose-Capillary: 78 mg/dL (ref 70–99)
Glucose-Capillary: 203 mg/dL — ABNORMAL HIGH (ref 70–99)

## 2012-08-02 LAB — CBC
Hemoglobin: 9.5 g/dL — ABNORMAL LOW (ref 12.0–15.0)
MCH: 21.4 pg — ABNORMAL LOW (ref 26.0–34.0)
MCV: 74.7 fL — ABNORMAL LOW (ref 78.0–100.0)
RBC: 4.43 MIL/uL (ref 3.87–5.11)

## 2012-08-02 LAB — RAPID URINE DRUG SCREEN, HOSP PERFORMED
Amphetamines: NOT DETECTED
Benzodiazepines: POSITIVE — AB
Opiates: NOT DETECTED
Tetrahydrocannabinol: NOT DETECTED

## 2012-08-02 MED ORDER — ALBUTEROL SULFATE HFA 108 (90 BASE) MCG/ACT IN AERS
1.0000 | INHALATION_SPRAY | Freq: Four times a day (QID) | RESPIRATORY_TRACT | Status: DC | PRN
  Filled 2012-08-02: qty 6.7

## 2012-08-02 MED ORDER — ALPRAZOLAM 0.25 MG PO TABS
0.2500 mg | ORAL_TABLET | Freq: Two times a day (BID) | ORAL | Status: DC | PRN
  Filled 2012-08-02: qty 1

## 2012-08-02 MED ORDER — INSULIN ASPART 100 UNIT/ML ~~LOC~~ SOLN
0.0000 [IU] | Freq: Three times a day (TID) | SUBCUTANEOUS | Status: DC
  Administered 2012-08-03: 1 [IU] via SUBCUTANEOUS

## 2012-08-02 MED ORDER — ASPIRIN EC 81 MG PO TBEC
81.0000 mg | DELAYED_RELEASE_TABLET | Freq: Every day | ORAL | Status: DC
  Administered 2012-08-03 – 2012-08-04 (×2): 81 mg via ORAL
  Filled 2012-08-02 (×2): qty 1

## 2012-08-02 MED ORDER — PANTOPRAZOLE SODIUM 40 MG PO TBEC
40.0000 mg | DELAYED_RELEASE_TABLET | Freq: Every day | ORAL | Status: DC
  Administered 2012-08-03 – 2012-08-04 (×2): 40 mg via ORAL
  Filled 2012-08-02: qty 1

## 2012-08-02 MED ORDER — INSULIN GLARGINE 100 UNIT/ML ~~LOC~~ SOLN
40.0000 [IU] | Freq: Two times a day (BID) | SUBCUTANEOUS | Status: DC
  Administered 2012-08-03 – 2012-08-04 (×2): 40 [IU] via SUBCUTANEOUS
  Filled 2012-08-02 (×5): qty 0.4

## 2012-08-02 MED ORDER — INSULIN ASPART 100 UNIT/ML ~~LOC~~ SOLN
0.0000 [IU] | Freq: Every day | SUBCUTANEOUS | Status: DC
  Administered 2012-08-02: 2 [IU] via SUBCUTANEOUS
  Filled 2012-08-02: qty 1

## 2012-08-02 MED ORDER — AMLODIPINE BESYLATE 5 MG PO TABS
5.0000 mg | ORAL_TABLET | Freq: Every day | ORAL | Status: DC
  Administered 2012-08-03 – 2012-08-04 (×2): 5 mg via ORAL
  Filled 2012-08-02 (×2): qty 1

## 2012-08-02 MED ORDER — HEPARIN SODIUM (PORCINE) 5000 UNIT/ML IJ SOLN
5000.0000 [IU] | Freq: Three times a day (TID) | INTRAMUSCULAR | Status: DC
  Administered 2012-08-03 – 2012-08-04 (×3): 5000 [IU] via SUBCUTANEOUS
  Filled 2012-08-02 (×6): qty 1

## 2012-08-02 MED ORDER — SODIUM CHLORIDE 0.9 % IJ SOLN
3.0000 mL | Freq: Two times a day (BID) | INTRAMUSCULAR | Status: DC
  Administered 2012-08-03 – 2012-08-04 (×4): 3 mL via INTRAVENOUS

## 2012-08-02 MED ORDER — METOPROLOL SUCCINATE ER 50 MG PO TB24
50.0000 mg | ORAL_TABLET | Freq: Every day | ORAL | Status: DC
  Filled 2012-08-02: qty 1

## 2012-08-02 NOTE — ED Provider Notes (Signed)
Medical screening examination/treatment/procedure(s) were conducted as a shared visit with non-physician practitioner(s) and myself.  I personally evaluated the patient during the encounter  Orlie Dakin, MD 08/02/12 1946

## 2012-08-02 NOTE — H&P (Addendum)
[facility] History and Physical  IGNACIO SHULAR [id number] DOB: 1950/10/22 DOA: 08/02/2012  Referring physician: Kenton Kingfisher, PA PCP: Elyn Peers, MD  Specialists: none  Chief Complaint: Altered mental status  HPI: [name] is a 62 y.o. female with history of CAD, hypertension, insulin-dependent diabetes, adjustment disorder and recently hospitalized 01/2012 for acute stress disorder and bereavement with suicidal ideation. There is no family at bedside. Patient is awake alert but cannot really tell me the reason why she's here. When asked where she is she says she is just in place with a lot of machines. She then goes on to state that she doesn't know her name but that it is written on her arm band.  She does recall however that she used cocaine about one week ago and states that his last time she probably used it, she states that she drank maybe one beer yesterday usually drinks about one can. Other orienting information is difficult to discern from the patient and she states that she lives with "some boys" and this is confirmed with ED physician assistant that the patient was accompanied by family who did not seem to have much more information as to what happened It is noted she's been in the emergency room 3 times since December of last year with was thought to be noncardiac chest pain followed by Dr. Waynard Edwards with friend Johnny Bridge who accompanied her to ED -She thinks patient was delusional-she couldn't walk back and forth to and from bathroom-she was asking "y'all taking me out today?'  She was told they cannot take her out as the family do not have a car. No recent drug  Urine analysis shows no evidence of acute infection, toxin is positive for benzodiazepines which she is on, she is also noted to be cocaine positive, CT of the head shows advanced microvascular ischemic disease without intracranial process Basic metabolic and LFTs are essentially normal with the  exception of alkaline phosphatase which is mildly elevated at 138 Point-of-care troponin 0.0 Patient is anemic to 9.5 hemoglobin with MCV of 74.7 and platelets 412 but this is her baseline from earlier this year 05/12/12 Chest x-ray shows no acute    Review of Systems: cannot provide any meaningful h/o  Past Medical History  Diagnosis Date  . History of colonic polyps   . GERD (gastroesophageal reflux disease)   . Hyperlipidemia   . Diabetes mellitus without mention of complication   . Hypertension   . Myocardial infarction   . Coronary artery disease   . Fatty liver disease, nonalcoholic   . Cholelithiasis   . Iron deficiency anemia   . Angina   . Shortness of breath   . Asthma   . Arthritis     HANDS"   Admission 02/01/12 for SI, depression/psych related issues Colonoscopy 2 sessile polpys Admission 11/20/11 recurrent CP c H/o PTCA RCA L circ-negative stress myoview that admit Admission 05/06/11 ROMI Admission 03/19/11 SOB, COPD ex, Dysphagia Admission 02/18/11 with CP ROMI No disease Admission 10/18/10 ROMI Admission 08/26/07 CP ROMI Admission 06/04/07 with CP, s/p stent BMS Admission 03/14/07 CP ROMI Admission 09/26/06 with CP, NOted PTCA + stent L circ Admission 05/07/06 for CP and stent perfomred 05/07/06  C. Epic for prior note   Past Surgical History  Procedure Laterality Date  . Right colectomy      After colonic perforation  . Tubal ligation    . Coronary angioplasty with stent placement    . Esophagogastroduodenoscopy  11/30/2011  Procedure: ESOPHAGOGASTRODUODENOSCOPY (EGD);  Surgeon: Lafayette Dragon, MD;  Location: [facility];  Service: Endoscopy;  Laterality: N/A;  . Colonoscopy  11/30/2011    Procedure: COLONOSCOPY;  Surgeon: Lafayette Dragon, MD;  Location: [facility];  Service: Endoscopy;  Laterality: N/A;   Social History:  reports that she quit smoking about 2 years ago. Her smoking use included Cigarettes. She has a 23.5 pack-year smoking history. She has  never used smokeless tobacco. She reports that she does not drink alcohol or use illicit drugs. Vision currently lives with family  No Known Allergies  Family History  Problem Relation Age of Onset  . Heart disease Mother   . Diabetes Mother   . Colon cancer Neg Hx   . Cancer Father     unsure what kind  . Diabetes Sister    Prior to Admission medications   Medication Sig Start Date End Date Taking? Authorizing Provider  albuterol (PROVENTIL HFA;VENTOLIN HFA) 108 (90 BASE) MCG/ACT inhaler Inhale 1-2 puffs into the lungs every 6 (six) hours as needed for wheezing or shortness of breath. 02/06/12   Waylan Boga, NP  ALPRAZolam Duanne Moron) 0.25 MG tablet Take 0.25 mg by mouth 2 (two) times daily as needed. For sleep    Historical Provider, MD  amLODipine (NORVASC) 5 MG tablet Take 1 tablet (5 mg total) by mouth daily. 02/06/12   Waylan Boga, NP  aspirin EC 81 MG EC tablet Take 1 tablet (81 mg total) by mouth daily. 02/06/12   Waylan Boga, NP  atorvastatin (LIPITOR) 20 MG tablet Take 20 mg by mouth daily.    Historical Provider, MD  carvedilol (COREG) 6.25 MG tablet Take 3.125 mg by mouth 2 (two) times daily with a meal.    Historical Provider, MD  famotidine (PEPCID) 20 MG tablet Take 1 tablet (20 mg total) by mouth 2 (two) times daily. 02/06/12   Waylan Boga, NP  insulin glargine (LANTUS) 100 UNIT/ML injection Inject 40 Units into the skin 2 (two) times daily.    Historical Provider, MD  metFORMIN (GLUCOPHAGE) 500 MG tablet Take 1 tablet (500 mg total) by mouth 2 (two) times daily with a meal. 02/06/12   Waylan Boga, NP  metoprolol succinate (TOPROL-XL) 50 MG 24 hr tablet Take 50 mg by mouth at bedtime. Take with or immediately following a meal.    Historical Provider, MD  nitroGLYCERIN (NITROSTAT) 0.4 MG SL tablet Place 1 tablet (0.4 mg total) under the tongue every 5 (five) minutes as needed. For chest pain 02/06/12   Waylan Boga, NP  pantoprazole (PROTONIX) 40 MG tablet Take 40 mg by mouth daily.     Historical Provider, MD   Physical Exam: Filed Vitals:   08/02/12 1700 08/02/12 1704 08/02/12 1715 08/02/12 1730  BP: 148/88 148/88 128/67 114/60  Pulse: 76  76 74  Temp:  98.6 F (37 C)    TempSrc:  Oral    Resp: 15 19 17 16   SpO2: 95% 95% 95% 94%     General:  Alert and oriented maybe to a remote memories cannot discern any orienting information right now-GCS is 15  Eyes: Pupils are dilated to 4 mm but constrict equally there is no specific constriction on either side,  ENT: Throat clear no rash or not swelling  Neck: No thyromegaly  Cardiovascular: No JVD no bruit S1-S2 slightly tachycardic no murmur rub or  Respiratory: Clinically clear  Abdomen: Soft nontender  Skin: No lower extremity edema  Musculoskeletal: Range of motion intact no  warm joints or swelling in  Psychiatric: Tearful anxious  Neurologic: Inconsistent exam, bilaterally is weak and does not move her arms as expected on command but is bilaterally weak.  Labs on Admission:  Basic Metabolic Panel:  Recent Labs Lab 08/02/12 1541  NA 140  K 3.6  CL 105  CO2 25  GLUCOSE 119*  BUN 13  CREATININE 1.09  CALCIUM 9.5   Liver Function Tests:  Recent Labs Lab 08/02/12 1541  AST 14  ALT 11  ALKPHOS 138*  BILITOT 0.5  PROT 8.5*  ALBUMIN 3.5   No results found for this basename: LIPASE, AMYLASE,  in the last 168 hours No results found for this basename: AMMONIA,  in the last 168 hours CBC:  Recent Labs Lab 08/02/12 1541  WBC 9.0  HGB 9.5*  HCT 33.1*  MCV 74.7*  PLT 412*   Cardiac Enzymes: No results found for this basename: CKTOTAL, CKMB, CKMBINDEX, TROPONINI,  in the last 168 hours  BNP (last 3 results)  Recent Labs  05/12/12 1247  PROBNP 20.8   CBG:  Recent Labs Lab 08/02/12 1600  GLUCAP 187*    Radiological Exams on Admission: Dg Chest 2 View  08/02/2012   *RADIOLOGY REPORT*  Clinical Data: Patient continues to day.  CHEST - 2 VIEW  Comparison: 07/07/2012.   Findings: The lungs are not well expanded but appear clear.  There are no effusions or pneumothoraces.  Heart and pulmonary vasculature are normal.  There are no acute bony changes.  IMPRESSION: No active disease.   Original Report Authenticated By: Vallery Ridge, M.D.   Ct Head Wo Contrast  08/02/2012   *RADIOLOGY REPORT*  Clinical Data: Severe headache, altered mental status  CT HEAD WITHOUT CONTRAST  Technique:  Contiguous axial images were obtained from the base of the skull through the vertex without contrast.  Comparison: None.  Findings:  There are rather extensive scattered periventricular hypodensities, left slightly greater than right, compatible with microvascular ischemic disease.  Given background parenchymal abnormalities, there is no CT evidence of acute large territory infarct.  No intraparenchymal or extra-axial mass or hemorrhage.  Normal size and configuration of the ventricles and basilar cisterns.  No midline shift.  Limited visualization of the paranasal sinuses demonstrates under pneumatization of the right frontal sinus.  The remaining paranasal sinuses and mastoid air cells are normally aerated.  No displaced calvarial fracture.  Regional soft tissues are normal.  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   Original Report Authenticated By: Jake Seats, MD    EKG: Independently reviewed. She shows sinus rhythm, no ST-T wave elevations that are new and is unchanged compared to prior  Assessment/Plan Principal Problem:   Toxic encephalopathy-Unlikely metabolic,?psychogenic Active Problems:   MYOCARDIAL INFARCTION   Diabetes mellitus   DOE (dyspnea on exertion)   Depression   1. Toxic encephalopathy-unclear etiology. Patient has no fever no chills no gross metabolic abnormalities not and gap and just appears to be generally confused but does seem to have some specific knowledge of some past things-? utility lumbar puncture to  rule out meningitis at this stage-I do think this may be related to medication and potential overdose of either cocaine or alcohol and she may be withdrawing from either of these. I have requested currently the emergency physician to obtain a lumbar puncture to rule out occult infection such as meningitis-will order Methylmalonic acid, B12, TSH-infectious workup other than lumbar puncture is complete. No concerns at this stage of atypical  seizure given the length of episodes and no focal repetitive movements to suggest the same.  Ed Md notes patient cannot give consent and suggests it to be done under Fluoro.  IR Md suggests attempt by Floor Md or ED Md and is willing to do this as a back-up-Paged Dr. Leonel Ramsay -see below addendum-- 2. H/o CAD-currently stable, unclear why she is on both metoprolol and Coreg. Will continue only metoprolol 50 each bedtime, nitroglycerin sublingual under tongue every 5 minutes-I. do not think this is acute coronary syndrome 3. Recent bereavement, psychiatric disorder-currently on Xanax 0.25 twice a day, seems anxious at present but unclear if this is an ingestion. 4. B12 deficiency-see above-hold cyanocobalamin for now 5. GERD hold Pepcid 20 twice a day 6. ? Alcohol abuse-will speak with family to determine what actually etiology might have been  Code Status: Presumed full  Family Communication: None  Disposition Plan: 70   Time spent: Kendall West, [facility] Pager [phone]  If 7PM-7AM, please contact night-coverage [email or web address] Password [facility] 08/02/2012, 6:29 PM   Spoke to [employer] Gregory-[[phone]] she was completely normal and she awoke confused.  Thinks that when they were asleep, allegedly BF might have given her "something" for this-This apparently has happened before and she was confused like this in the past and happens "once in a while" This time the confusion was apparently major- the other times she was just not  feeling well and not as confused. Will d/c Vanc/Ceftriax/Acyclovir as likely not meningitic  Will treat as acute intoxication and Place on tele and hope this clears up without event  Cannot order Benzoylecgonine (BE) as not available here

## 2012-08-02 NOTE — Progress Notes (Deleted)
ANTIBIOTIC CONSULT NOTE - INITIAL  Pharmacy Consult for Vancomycin, Rocephin, Acyclovir Indication: rule out meningitis  No Known Allergies  Patient Measurements: Weight = 60 kg   Vital Signs: Temp: 98.6 F (37 C) (06/28 1704) Temp src: Oral (06/28 1704) BP: 114/60 mmHg (06/28 1730) Pulse Rate: 74 (06/28 1730)    Labs:  Recent Labs  08/02/12 1541  WBC 9.0  HGB 9.5*  PLT 412*  CREATININE 1.09   The CrCl is unknown because both a height and weight (above a minimum accepted value) are required for this calculation. No results found for this basename: VANCOTROUGH, VANCOPEAK, VANCORANDOM, GENTTROUGH, GENTPEAK, GENTRANDOM, TOBRATROUGH, TOBRAPEAK, TOBRARND, AMIKACINPEAK, AMIKACINTROU, AMIKACIN,  in the last 72 hours   Microbiology: No results found for this or any previous visit (from the past 720 hour(s)).  Medical History: Past Medical History  Diagnosis Date  . History of colonic polyps   . GERD (gastroesophageal reflux disease)   . Hyperlipidemia   . Diabetes mellitus without mention of complication   . Hypertension   . Myocardial infarction   . Coronary artery disease   . Fatty liver disease, nonalcoholic   . Cholelithiasis   . Iron deficiency anemia   . Angina   . Shortness of breath   . Asthma   . Arthritis     [name]"   Assessment: 62 year old female admitted with altered mental status.  Past medical history of CAD, HTN, IDDM, and recent hospitalization of adjustment disorder.  Starting broad spectrum antibiotics/virals until she rules out for meningitis. Scr = 1.09  Goal of Therapy:  Vancomycin trough level 15-20 mcg/ml Appropriate Rocephin and Acyclovir dosing  Plan:  1) Rocephin 2 Grams iv Q 12 hours 2) Vancomycin 500 mg iv Q 12 hours 3) Acyclovir 600 mg iv Q 12 hours 4) Follow up Scr, plan, cultures.  Thank you. Anette Guarneri, PharmD [phone]  Tad Moore 08/02/2012,7:43 PM

## 2012-08-02 NOTE — ED Notes (Signed)
Report attempted 

## 2012-08-02 NOTE — ED Notes (Addendum)
Family reports that pt is normally a&ox4, woke up this am confused. Pt not able to answer questions appropriately for me at triage. Airway intact. Pt was started on lyrica on 6/25.

## 2012-08-02 NOTE — Progress Notes (Signed)
Chaplain responded to ED CDU 11 after receiving a page request by the nurse. Pt was confused, sad and tear up according to nurse. Pt was sleeping upon entering her room. Chaplain said a silent prayer and will follow up as needed. Pt was later admitted to [id number]. Jarrett Ables [phone]

## 2012-08-02 NOTE — ED Notes (Signed)
CBG check prior to meal

## 2012-08-02 NOTE — ED Provider Notes (Signed)
Level V caveat altered mental status . Patient confused since this morning. Normally patient is alert and fully oriented. She is oriented x0 present she does not recognize family members. She follows simple commands, moves all extremities.   Orlie Dakin, MD 08/02/12 1735

## 2012-08-02 NOTE — ED Provider Notes (Signed)
History    CSN: [id number] Arrival date & time 08/02/12  1525  First MD Initiated Contact with Patient 08/02/12 1547     Chief Complaint  Patient presents with  . Altered Mental Status   (Consider location/radiation/quality/duration/timing/severity/associated sxs/prior Treatment) HPI  62 year old female presents to the emergency department with acutely altered mental status.  There is a level V caveat do to her state.  She is brought in by a friend.  Her friend gives the history.  She states that this morning around 10 AM he said his son called her because they felt she was acting strangely.  The patient was acutely confused.  She kept telling them that they have promised to take her to [facility].  She became very irate.  She has never had acutely altered mental status before.  She has a history of ABGs, MI, CAD angioplasty and stent placement. She had a recent change in her diabetic medications and was recently placed on the Lyrica as well.   Past Medical History  Diagnosis Date  . History of colonic polyps   . GERD (gastroesophageal reflux disease)   . Hyperlipidemia   . Diabetes mellitus without mention of complication   . Hypertension   . Myocardial infarction   . Coronary artery disease   . Fatty liver disease, nonalcoholic   . Cholelithiasis   . Iron deficiency anemia   . Angina   . Shortness of breath   . Asthma   . Arthritis     HANDS"   Past Surgical History  Procedure Laterality Date  . Right colectomy      After colonic perforation  . Tubal ligation    . Coronary angioplasty with stent placement    . Esophagogastroduodenoscopy  11/30/2011    Procedure: ESOPHAGOGASTRODUODENOSCOPY (EGD);  Surgeon: Lafayette Dragon, MD;  Location: [facility];  Service: Endoscopy;  Laterality: N/A;  . Colonoscopy  11/30/2011    Procedure: COLONOSCOPY;  Surgeon: Lafayette Dragon, MD;  Location: [facility];  Service: Endoscopy;  Laterality: N/A;   Family History  Problem Relation  Age of Onset  . Heart disease Mother   . Diabetes Mother   . Colon cancer Neg Hx   . Cancer Father     unsure what kind  . Diabetes Sister    History  Substance Use Topics  . Smoking status: Former Smoker -- 0.50 packs/day for 47 years    Types: Cigarettes    Quit date: 05/09/2010  . Smokeless tobacco: Never Used  . Alcohol Use: No   OB History   Grav Para Term Preterm Abortions TAB SAB Ect Mult Living                 Review of Systems Unable to perform review of systems because of altered mental status Allergies  Review of patient's allergies indicates no known allergies.  Home Medications   Current Outpatient Rx  Name  Route  Sig  Dispense  Refill  . albuterol (PROVENTIL HFA;VENTOLIN HFA) 108 (90 BASE) MCG/ACT inhaler   Inhalation   Inhale 1-2 puffs into the lungs every 6 (six) hours as needed for wheezing or shortness of breath.   1 Inhaler   0   . ALPRAZolam (XANAX) 0.25 MG tablet   Oral   Take 0.25 mg by mouth 2 (two) times daily as needed. For sleep         . amLODipine (NORVASC) 5 MG tablet   Oral   Take 1 tablet (5  mg total) by mouth daily.   30 tablet   0   . aspirin EC 81 MG EC tablet   Oral   Take 1 tablet (81 mg total) by mouth daily.   30 tablet   0   . atorvastatin (LIPITOR) 20 MG tablet   Oral   Take 20 mg by mouth daily.         . carvedilol (COREG) 6.25 MG tablet   Oral   Take 3.125 mg by mouth 2 (two) times daily with a meal.         . famotidine (PEPCID) 20 MG tablet   Oral   Take 1 tablet (20 mg total) by mouth 2 (two) times daily.   60 tablet   0   . insulin glargine (LANTUS) 100 UNIT/ML injection   Subcutaneous   Inject 40 Units into the skin 2 (two) times daily.         . metFORMIN (GLUCOPHAGE) 500 MG tablet   Oral   Take 1 tablet (500 mg total) by mouth 2 (two) times daily with a meal.   60 tablet   0   . metoprolol succinate (TOPROL-XL) 50 MG 24 hr tablet   Oral   Take 50 mg by mouth at bedtime. Take with  or immediately following a meal.         . nitroGLYCERIN (NITROSTAT) 0.4 MG SL tablet   Sublingual   Place 1 tablet (0.4 mg total) under the tongue every 5 (five) minutes as needed. For chest pain   10 tablet   0   . pantoprazole (PROTONIX) 40 MG tablet   Oral   Take 40 mg by mouth daily.          BP 135/67  Pulse 84  Temp(Src) 98.7 F (37.1 C) (Oral)  Resp 16  SpO2 95% Physical Exam Physical Exam  Nursing note and vitals reviewed. Constitutional: She is oriented to person, place, and time. She appears well-developed and well-nourished. No distress.  HENT:  Head: Normocephalic and atraumatic.  Eyes: Conjunctivae normal and EOM are normal. Pupils are equal, round, and reactive to light. No scleral icterus.  Neck: Normal range of motion.  Cardiovascular: Normal rate, regular rhythm and normal heart sounds.  Exam reveals no gallop and no friction rub.   No murmur heard. Pulmonary/Chest: Effort normal and breath sounds normal. No respiratory distress.  Abdominal: Soft. Bowel sounds are normal. She exhibits no distension and no mass. There is no tenderness. There is no guarding.  Neurological: She is alert but she is without orientation to person place or time.  Follow simple commands.  GCS of 15.  There is no overtly obvious sign of focal neurologic abnormality.  Difficult to assess due to her mental state.  ED Course  Procedures (including critical care time) Labs Reviewed  CBC - Abnormal; Notable for the following:    Hemoglobin 9.5 (*)    HCT 33.1 (*)    MCV 74.7 (*)    MCH 21.4 (*)    MCHC 28.7 (*)    RDW 17.9 (*)    Platelets 412 (*)    All other components within normal limits  COMPREHENSIVE METABOLIC PANEL - Abnormal; Notable for the following:    Glucose, Bld 119 (*)    Total Protein 8.5 (*)    Alkaline Phosphatase 138 (*)    GFR calc non Af Amer 52 (*)    GFR calc Af Amer 61 (*)    All other components  within normal limits  URINALYSIS, ROUTINE W REFLEX  MICROSCOPIC - Abnormal; Notable for the following:    Bilirubin Urine SMALL (*)    All other components within normal limits  GLUCOSE, CAPILLARY - Abnormal; Notable for the following:    Glucose-Capillary 187 (*)    All other components within normal limits  URINE RAPID DRUG SCREEN (HOSP PERFORMED) - Abnormal; Notable for the following:    Cocaine POSITIVE (*)    Benzodiazepines POSITIVE (*)    All other components within normal limits  AMMONIA  POCT I-STAT TROPONIN I   No results found. 1. Encephalopathy acute     MDM  6:16 PM BP 114/60  Pulse 74  Temp(Src) 98.6 F (37 C) (Oral)  Resp 16  SpO2 94% Patient with acute encephalopathy.  No anion gap, UDS is positive for cocaine and benzodiazepines. I have spoken with Dr. Verlon Au who will admit the patient. He has asked to add on ammonia level. Still unsure of the etiology of her delirium. She will be admitted to stepdown for observation and further workup.  Spoke with the patient's son Tenlee Brozowski His Direct line is [phone].  Margarita Mail, PA-C 08/02/12 [place], PA-C 08/02/12 1930

## 2012-08-03 ENCOUNTER — Inpatient Hospital Stay (HOSPITAL_COMMUNITY): Payer: Medicaid Other

## 2012-08-03 DIAGNOSIS — Z733 Stress, not elsewhere classified: Secondary | ICD-10-CM

## 2012-08-03 DIAGNOSIS — I2510 Atherosclerotic heart disease of native coronary artery without angina pectoris: Secondary | ICD-10-CM

## 2012-08-03 DIAGNOSIS — G92 Toxic encephalopathy: Principal | ICD-10-CM

## 2012-08-03 DIAGNOSIS — E119 Type 2 diabetes mellitus without complications: Secondary | ICD-10-CM

## 2012-08-03 DIAGNOSIS — F4321 Adjustment disorder with depressed mood: Secondary | ICD-10-CM

## 2012-08-03 LAB — COMPREHENSIVE METABOLIC PANEL
ALT: 9 U/L (ref 0–35)
AST: 13 U/L (ref 0–37)
CO2: 22 mEq/L (ref 19–32)
Calcium: 8.9 mg/dL (ref 8.4–10.5)
Chloride: 104 mEq/L (ref 96–112)
Creatinine, Ser: 1.15 mg/dL — ABNORMAL HIGH (ref 0.50–1.10)
GFR calc Af Amer: 57 mL/min — ABNORMAL LOW (ref >90)
GFR calc non Af Amer: 49 mL/min — ABNORMAL LOW (ref >90)
Glucose, Bld: 86 mg/dL (ref 70–99)
Sodium: 138 mEq/L (ref 135–145)
Total Bilirubin: 0.4 mg/dL (ref 0.3–1.2)

## 2012-08-03 LAB — GLUCOSE, CAPILLARY
Glucose-Capillary: 95 mg/dL (ref 70–99)
Glucose-Capillary: 94 mg/dL (ref 70–99)
Glucose-Capillary: 148 mg/dL — ABNORMAL HIGH (ref 70–99)

## 2012-08-03 LAB — TSH: TSH: 0.586 u[IU]/mL (ref 0.350–4.500)

## 2012-08-03 LAB — TROPONIN I
Troponin I: <0.3 ng/mL (ref <0.30)
Troponin I: <0.3 ng/mL (ref <0.30)

## 2012-08-03 MED ORDER — CARVEDILOL 3.125 MG PO TABS
3.1250 mg | ORAL_TABLET | Freq: Two times a day (BID) | ORAL | Status: DC
  Administered 2012-08-03 – 2012-08-04 (×2): 3.125 mg via ORAL
  Filled 2012-08-03 (×4): qty 1

## 2012-08-03 MED ORDER — ACETAMINOPHEN 325 MG PO TABS
650.0000 mg | ORAL_TABLET | Freq: Four times a day (QID) | ORAL | Status: DC | PRN
  Administered 2012-08-03 – 2012-08-04 (×2): 650 mg via ORAL
  Filled 2012-08-03 (×2): qty 2

## 2012-08-03 MED ORDER — CARVEDILOL 3.125 MG PO TABS: 3.1250 mg | ORAL_TABLET | Freq: Two times a day (BID) | ORAL | Status: DC

## 2012-08-03 NOTE — Progress Notes (Signed)
[facility] PROGRESS NOTE  [name] [id number] DOB: Apr 16, 1950 DOA: 08/02/2012 PCP: Elyn Peers, MD  Assessment/Plan: Toxic encephalopathy-Unlikely metabolic,likely secondary to drugs/coaine+, states used cocaine on Thurs/6/25 and that it was her 1st time -ct head neg, no signs of infection- Afebrile, hemodynamically stable, and UA, CXR neg. Vit B12 and TSH wnl -monitor closely, consider psych if does not improve Active Problems:  H/O MYOCARDIAL INFARCTION -discussed pt with Dr Nobie Putnam call for Dr Terrence Dupont) regarding continuing b-blockers in setting of cocaine use due to concern about alpha adrenergic crisis And he recommends to continue the corge since pt was on iy prior to this and since she is on a small dose. Also discussed with him fact that pt was also has as being on metoprolol as well and he recommends to d/c metoprolol  Diabetes mellitus  -continue lantus and SSI Recent Bereavement- continue xanax GERD -PPI   Code Status: presumed full Family Communication:none at bedside  Disposition Plan: pending clinical course   Consultants:  Dr Doylene Canard (on call for Dr Terrence Dupont ) via phone  Procedures:  none  Antibiotics:  none  HPI/Subjective: Pt alert denies any c/o, some confusion  Objective: Filed Vitals:   08/02/12 2230 08/02/12 2315 08/03/12 0457 08/03/12 0934  BP: 135/55 133/59 124/65 128/64  Pulse: 74 66 65   Temp:  98 F (36.7 C) 98.2 F (36.8 C)   TempSrc:  Oral Oral   Resp: 20 20 20    Weight:  64.1 kg (141 lb 5 oz) 63.9 kg (140 lb 14 oz)   SpO2: 97% 100% 97%     Intake/Output Summary (Last 24 hours) at 08/03/12 1014 Last data filed at 08/03/12 0800  Gross per 24 hour  Intake      0 ml  Output    700 ml  Net   -700 ml   Filed Weights   08/02/12 2315 08/03/12 0457  Weight: 64.1 kg (141 lb 5 oz) 63.9 kg (140 lb 14 oz)   Exam:  General: alert & oriented x 1,In NAD, answers simple questions appropriately, some  confusion Cardiovascular: RRR, nl S1 s2 Respiratory: CTAB Abdomen: soft +BS NT/ND, no masses palpable Extremities: No cyanosis and no edema     Data Reviewed: Basic Metabolic Panel:  Recent Labs Lab 08/02/12 1541 08/03/12 0630  NA 140 138  K 3.6 3.9  CL 105 104  CO2 25 22  GLUCOSE 119* 86  BUN 13 12  CREATININE 1.09 1.15*  CALCIUM 9.5 8.9   Liver Function Tests:  Recent Labs Lab 08/02/12 1541 08/03/12 0630  AST 14 13  ALT 11 9  ALKPHOS 138* 126*  BILITOT 0.5 0.4  PROT 8.5* 8.1  ALBUMIN 3.5 3.3*   No results found for this basename: LIPASE, AMYLASE,  in the last 168 hours  Recent Labs Lab 08/02/12 1830  AMMONIA 28   CBC:  Recent Labs Lab 08/02/12 1541  WBC 9.0  HGB 9.5*  HCT 33.1*  MCV 74.7*  PLT 412*   Cardiac Enzymes: No results found for this basename: CKTOTAL, CKMB, CKMBINDEX, TROPONINI,  in the last 168 hours BNP (last 3 results)  Recent Labs  05/12/12 1247  PROBNP 20.8   CBG:  Recent Labs Lab 08/02/12 1600 08/02/12 2116 08/02/12 2209 08/02/12 2326 08/03/12 0619  GLUCAP 187* 78 203* 179* 95    No results found for this or any previous visit (from the past 240 hour(s)).   Studies: Dg Chest 2 View  08/02/2012   *RADIOLOGY REPORT*  Clinical Data: Patient continues to day.  CHEST - 2 VIEW  Comparison: 07/07/2012.  Findings: The lungs are not well expanded but appear clear.  There are no effusions or pneumothoraces.  Heart and pulmonary vasculature are normal.  There are no acute bony changes.  IMPRESSION: No active disease.   Original Report Authenticated By: Vallery Ridge, M.D.   Ct Head Wo Contrast  08/02/2012   *RADIOLOGY REPORT*  Clinical Data: Severe headache, altered mental status  CT HEAD WITHOUT CONTRAST  Technique:  Contiguous axial images were obtained from the base of the skull through the vertex without contrast.  Comparison: None.  Findings:  There are rather extensive scattered periventricular hypodensities, left  slightly greater than right, compatible with microvascular ischemic disease.  Given background parenchymal abnormalities, there is no CT evidence of acute large territory infarct.  No intraparenchymal or extra-axial mass or hemorrhage.  Normal size and configuration of the ventricles and basilar cisterns.  No midline shift.  Limited visualization of the paranasal sinuses demonstrates under pneumatization of the right frontal sinus.  The remaining paranasal sinuses and mastoid air cells are normally aerated.  No displaced calvarial fracture.  Regional soft tissues are normal.  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   Original Report Authenticated By: Jake Seats, MD    Scheduled Meds: . amLODipine  5 mg Oral Daily  . aspirin EC  81 mg Oral Daily  . heparin  5,000 Units Subcutaneous Q8H  . insulin aspart  0-5 Units Subcutaneous QHS  . insulin aspart  0-9 Units Subcutaneous TID WC  . insulin glargine  40 Units Subcutaneous BID  . metoprolol succinate  50 mg Oral QHS  . pantoprazole  40 mg Oral Daily  . sodium chloride  3 mL Intravenous Q12H   Continuous Infusions:   Principal Problem:   Toxic encephalopathy-Unlikely metabolic,?psychogenic Active Problems:   MYOCARDIAL INFARCTION   Diabetes mellitus   DOE (dyspnea on exertion)   Depression    Time spent: [facility] Pager [phone]. If 7PM-7AM, please contact night-coverage at [email or web address], password [facility] 08/03/2012, 10:14 AM  LOS: 1 day

## 2012-08-03 NOTE — Progress Notes (Signed)
Pt continues to be confused but calm this am. Crying at times. Was able to ask for bedpan and voided 400cc. No family at bedside

## 2012-08-04 DIAGNOSIS — I10 Essential (primary) hypertension: Secondary | ICD-10-CM

## 2012-08-04 LAB — GLUCOSE, CAPILLARY
Glucose-Capillary: 102 mg/dL — ABNORMAL HIGH (ref 70–99)
Glucose-Capillary: 92 mg/dL (ref 70–99)

## 2012-08-04 MED ORDER — AMLODIPINE BESYLATE 10 MG PO TABS: 10.0000 mg | ORAL_TABLET | Freq: Every day | ORAL | Status: DC

## 2012-08-04 NOTE — Clinical Social Work Psychosocial (Signed)
     Clinical Social Work Department BRIEF PSYCHOSOCIAL ASSESSMENT 08/04/2012  Patient:  [name], [name]     Account Number:  [id number]     Admit date:  08/02/2012  Clinical Social Worker:  Iona Coach  Date/Time:  08/04/2012 05:22 PM  Referred by:  Physician  Date Referred:  08/03/2012 Referred for  Substance Abuse  Other - See comment   Other Referral:   patient wants to go home to get her bills paid   Interview type:  Patient Other interview type:    PSYCHOSOCIAL DATA Living Status:  FAMILY Admitted from facility:   Level of care:   Primary support name:   Primary support relationship to patient:  CHILD, ADULT Degree of support available:   Fair- patient very vague and did not wish to discuss her son.    CURRENT CONCERNS Current Concerns  Substance Abuse  Post-Acute Placement   Other Concerns:    SOCIAL WORK ASSESSMENT / PLAN CSW met with patient today to discuss mutliple issues. Nursing related this morning that patient was very tearful and demanding to leave today- she is very worried about paying her bills and states that neither her son nor her grandson can pay her bills.  They do not have access to her bank account and she is unwiling to give anyone access. Patient states that she will "leave" if not discharged. She lives at home with her son and adult grandson- states that her son just moved in with her from "up [place]".  He is currently unemployed.  CSW also discussed with patient her positive drug screen for cocaine.  At first, patient refused to talk to [place] but then bacame very tearful- stating the she didn't know about the drug and that someone "gave it to her without her knowledge."  As CSW continued to attempt to talk to her she became quite agitated and demanded that the subject be dropped or "get out."  PT also recommended SNF placement due to weakness but patient because even more agitated when this topic was discussed- stating "I"m going home- NOW."   CSW provided support and attempted to calm patient. She has an aide with [facility] who comes 3 hours a day per patient. she feels that she can manage with this.  Above discussed with Dr. Candis Shine who plans to d/c patient home today.   Assessment/plan status:  No Further Intervention Required Other assessment/ plan:   Information/referral to community resources:   Patient refuses all offers of community resources    PATIENTS/FAMILYS RESPONSE TO PLAN OF CARE: Patient is alert and oriented to person and place; her verbal responses were, at times, delayed but some of this appeared to be due to tearfulness.  It was difficult to assess this.  Patient would become easily upset and tearful when talking about anything except being released home. She admanatly refuses all offers of drug counseling or therapy options.  MD planned too arrange for [place], RN and SW.  RNCM is aware . No further CSW needs identififed.  CSW signing off.  Lorie Phenix. [place], [place]

## 2012-08-04 NOTE — Evaluation (Signed)
Occupational Therapy Evaluation Patient Details Name: [name] MRN: [id number] DOB: May 08, 1950 Today's Date: 08/04/2012 Time: [id number] OT Time Calculation (min): 23 min  OT Assessment / Plan / Recommendation History of present illness pt admitted with toxic encephalopthy, cocaine + in blood   Clinical Impression   Pt overall needs min assist for functional balance with selfcare tasks.  Does exhibit awareness of situation and why she is in the hospital however reports no change in her mobility or balance with this admission.  Unsure if she will have 24 hour supervision at discharge but feel she will need it initially for safety.  Will continue to see for acute care OT services to help increase overall independence.    OT Assessment  Patient needs continued OT Services    Follow Up Recommendations  SNF    Barriers to Discharge Decreased caregiver support    Equipment Recommendations  3 in 1 bedside comode       Frequency  Min 2X/week    Precautions / Restrictions Precautions Precautions: Fall Restrictions Weight Bearing Restrictions: No   Pertinent Vitals/Pain O2 98 % on room air    ADL  Eating/Feeding: Simulated;Set up Grooming: Performed;Supervision/safety Where Assessed - Grooming: Supported standing Upper Body Bathing: Simulated;Set up Where Assessed - Upper Body Bathing: Unsupported sitting Lower Body Bathing: Simulated;Minimal assistance Where Assessed - Lower Body Bathing: Supported sit to stand Upper Body Dressing: Simulated;Set up Where Assessed - Upper Body Dressing: Unsupported sitting Lower Body Dressing: Simulated;Minimal assistance Where Assessed - Lower Body Dressing: Supported sit to stand Toilet Transfer: Simulated;Minimal assistance Toilet Transfer Method: Other (comment) (ambulate without assistive device) Science writer: Comfort height toilet Toileting - Clothing Manipulation and Hygiene: Simulated;Minimal assistance Where Assessed  - Best boy and Hygiene: Sit to stand from 3-in-1 or toilet Tub/Shower Transfer Method: Not assessed Equipment Used: Gait belt Transfers/Ambulation Related to ADLs: Pt ambulates with min assist in the room without any assistive device. ADL Comments: Pt needs min assist for toilet transfers without use of assistive device.  Tends to use sink and other objects for balance however pt does not report any difference in her walking compared to her normal baseline.      OT Diagnosis: Generalized weakness;Acute pain  OT Problem List: Impaired balance (sitting and/or standing);Decreased activity tolerance;Decreased strength;Decreased knowledge of use of DME or AE;Pain OT Treatment Interventions: Self-care/ADL training;Balance training;Patient/family education;Therapeutic activities;DME and/or AE instruction   OT Goals(Current goals can be found in the care plan section) Acute Rehab OT Goals Patient Stated Goal: home OT Goal Formulation: With patient Time For Goal Achievement: 08/18/12 Potential to Achieve Goals: Good  Visit Information  Last OT Received On: 08/04/12 Assistance Needed: +1 History of Present Illness: pt admitted with toxic encephalopthy, cocaine + in blood       Prior [place] expects to be discharged to:: Unsure Additional Comments: pt lives with son/grandson with personal aide 5x/wk for 4 hrs a day for cooking and to assist with bathing. Patient may need SNF placement due to balance impairments and increased falls risk  Prior Function Level of Independence: Needs assistance Gait / Transfers Assistance Needed: pt uses a cane ADL's / Homemaking Assistance Needed: aide does cooking and assist with bathing Communication Communication: No difficulties Dominant Hand: Right         Vision/Perception Vision - History Baseline Vision: Wears glasses all the time Patient Visual Report: No change from baseline Vision -  Assessment Eye Alignment: Within Functional  Limits Vision Assessment: Vision not tested Perception Perception: Within Functional Limits Praxis Praxis: Intact   Cognition  Cognition Arousal/Alertness: Awake/alert Behavior During Therapy: Flat affect Overall Cognitive Status: Impaired/Different from baseline Area of Impairment: Problem solving;Awareness;Memory Memory: Decreased short-term memory Awareness: Intellectual Problem Solving: Slow processing General Comments: Pt only able to state 2/3 for 3 word recall.  Needed mod questioning cueing to state what number she would call in an emergency.    Extremity/Trunk Assessment Upper Extremity Assessment Upper Extremity Assessment: Generalized weakness Lower Extremity Assessment Lower Extremity Assessment: Defer to PT evaluation Cervical / Trunk Assessment Cervical / Trunk Assessment: Normal     Mobility Bed Mobility Bed Mobility: Supine to Sit Supine to Sit: 5: Supervision;With rails Transfers Transfers: Sit to Stand Sit to Stand: With armrests;From chair/3-in-1;4: Min guard Stand to Sit: 4: Min guard;To toilet;With upper extremity assist        Balance Balance Balance Assessed: Yes Static Standing Balance Static Standing - Balance Support: No upper extremity supported Static Standing - Level of Assistance: 4: Min assist   End of Session OT - End of Session Activity Tolerance: Patient limited by fatigue Patient left: in bed;with call bell/phone within reach Nurse Communication: Mobility status     [place] OTR/L Pager number [phone] 08/04/2012, 1:08 PM

## 2012-08-04 NOTE — Progress Notes (Signed)
DC IV, DC Tele, DC Home. Discharge instructions and home medications discussed with patient and patient's daughter. Patient and daughter denied any questions or concerns at this time. Patient leaving unit via wheelchair and appears in no acute distress. 

## 2012-08-04 NOTE — Evaluation (Signed)
Physical Therapy Evaluation Patient Details Name: [name] MRN: [id number] DOB: [birth year] Today's Date: 08/04/2012 Time: 0837-0900 PT Time Calculation (min): 23 min  PT Assessment / Plan / Recommendation History of Present Illness  pt admitted with toxic encephalopthy, cocaine + in blood  Clinical Impression  Pt at significant increased falls risk requiring 24/7 supervision/assist at this time. Pt with noted cognitive deficits as well. Pt reports she has CNA 5x/wk for 4 hours each day to assist with bathing and cooking however pt currently unsafe to perform transfers or ambulation without assist. Questionable if son and grandson are able to assist pt as she has had recent falls at home. Pt may need SNF if pt doesn't achieve safe function at supervision level. Spoke with CSW regarding d/c recommendations    PT Assessment  Patient needs continued PT services    Follow Up Recommendations  Other (comment);SNF (pending pt mobility progression)    Does the patient have the potential to tolerate intense rehabilitation      Barriers to Discharge Decreased caregiver support reports son and grandson avail 24/7 however pt has fallen at home.    Equipment Recommendations   (TBA)    Recommendations for Other Services     Frequency Min 3X/week    Precautions / Restrictions Precautions Precautions: Fall Restrictions Weight Bearing Restrictions: No   Pertinent Vitals/Pain Pt denies pain      Mobility  Bed Mobility Bed Mobility: Not assessed (pt received sitting EOB) Transfers Transfers: Sit to Stand;Stand to Sit Sit to Stand: 3: Mod assist;With upper extremity assist;From bed Stand to Sit: 3: Mod assist;With upper extremity assist;To chair/3-in-1;With armrests Details for Transfer Assistance: pt with significant LOB posterior when transitioning to stand requiring modA to maintain balance. Pt required modA to transition to sitting due to impaired sequencing and posterior LOB when  transitioning hands to arm rests Ambulation/Gait Ambulation/Gait Assistance: 3: Mod assist Ambulation Distance (Feet): 75 Feet Assistive device: 1 person hand held assist Ambulation/Gait Assistance Details: pt with ataxia, narrowed base of support. Pt unsteady with freq episodes of LOB requiring modA to maintain balance. pt very cautious and guarded Gait Pattern: Step-to pattern;Decreased stride length;Ataxic;Narrow base of support Gait velocity: slow, guarded Stairs: No    Exercises     PT Diagnosis: Difficulty walking  PT Problem List: Decreased activity tolerance;Decreased balance;Decreased mobility;Decreased cognition PT Treatment Interventions: Gait training;Functional mobility training;Therapeutic activities;Therapeutic exercise;Balance training     PT Goals(Current goals can be found in the care plan section) Acute Rehab PT Goals Patient Stated Goal: home PT Goal Formulation: With patient Time For Goal Achievement: 08/18/12 Potential to Achieve Goals: Good  Visit Information  Last PT Received On: 08/04/12 Assistance Needed: +1 History of Present Illness: pt admitted with toxic encephalopthy, cocaine + in blood       Prior [place] expects to be discharged to:: Unsure Additional Comments: pt lives with son/grandson with personal aide 5x/wk for 4 hrs a day for cooking and to assist with bathing. Patient may need SNF placement due to balance impairments and increased falls risk  Prior Function Level of Independence: Needs assistance Gait / Transfers Assistance Needed: pt uses a cane ADL's / Homemaking Assistance Needed: aide does cooking and assist with bathing Communication Communication: No difficulties Dominant Hand: Right    Cognition  Cognition Arousal/Alertness: Awake/alert Behavior During Therapy: Flat affect Overall Cognitive Status: Impaired/Different from baseline Area of Impairment: Problem solving;Awareness;Memory Memory:  Decreased short-term memory Awareness: Emergent Problem Solving: Slow processing General  Comments: pt with extremely delayed processing. pt became emotional regarding getting her bills paid by tommorrow    Extremity/Trunk Assessment Upper Extremity Assessment Upper Extremity Assessment: Overall WFL for tasks assessed Lower Extremity Assessment Lower Extremity Assessment: Overall WFL for tasks assessed Cervical / Trunk Assessment Cervical / Trunk Assessment: Normal   Balance Balance Balance Assessed: Yes Static Sitting Balance Static Sitting - Balance Support: No upper extremity supported Static Sitting - Level of Assistance: 5: Stand by assistance Static Sitting - Comment/# of Minutes: sat EOB x 5 min  End of Session PT - End of Session Equipment Utilized During Treatment: Gait belt Activity Tolerance: Patient tolerated treatment well Patient left: in chair;with call bell/phone within reach;with nursing/sitter in room Nurse Communication: Mobility status  GP     Kingsley Callander 08/04/2012, 9:28 AM  Kittie Plater, PT, DPT Pager #: [phone] Office #: [phone]

## 2012-08-04 NOTE — Discharge Summary (Signed)
Physician Discharge Summary  [name] [id number] DOB: 07-18-1950 DOA: 08/02/2012  PCP: Dominique Peers, MD  Admit date: 08/02/2012 Discharge date: 08/04/2012  Time spent: 38minutes  Recommendations for Outpatient Follow-up:      Follow-up Information   Follow up with Dominique Peers, MD. (in 1week, call for appt upon discharge )    Contact information:   [place] [address] [phone]       Follow up with Dominique Demark, MD. (in 2weeks, call for appt upon discharge)    Contact information:   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 female with history of CAD, hypertension, insulin-dependent diabetes, adjustment disorder and recently hospitalized 01/2012 for acute stress disorder and bereavement with suicidal ideation.  There is no family at bedside.  Patient is awake alert but cannot really tell me the reason why she's here. When asked where she is she says she is just in place with a lot of machines. She then goes on to state that she doesn't know her name but that it is written on her arm band.  She does recall however that she used cocaine about one week ago and states that his last time she probably used it, she states that she drank maybe one beer yesterday usually drinks about one can. Other orienting information is difficult to discern from the patient and she states that she lives with "some boys" and this is confirmed with ED  physician assistant that the patient was accompanied by family who did not seem to have much more information as to what happened  It is noted she's been in the emergency room 3 times since December of last year with was thought to be noncardiac chest pain followed by [name] with friend [name] who accompanied her to ED -She thinks patient was delusional-she couldn't walk back and forth to and from bathroom-she was asking "y'all taking me out today?' She was told they cannot take her out as the family do not have a car.  No recent drug  Urine analysis shows no evidence of acute infection, toxin is positive for benzodiazepines which she is on, she is also noted to be cocaine positive, CT of the head shows advanced microvascular ischemic disease without intracranial process  Basic metabolic and LFTs are essentially normal with the exception of alkaline phosphatase which is mildly elevated at 138  Point-of-care troponin 0.0  Patient is anemic to 9.5 hemoglobin with MCV of 74.7 and platelets 412 but this is her baseline from earlier this year 05/12/12  Chest x-ray shows no acute  On followup visit [name] admit to cocaine use on Thursday 6/25, stated that it was her first time.    Hospital Course:  Toxic encephalopathy- likely secondary to drugs/coaine+, states used cocaine on Thurs/6/25 and that it was her 1st time  -As discussed above upon admission CT head neg, no signs of  infection- Afebrile, hemodynamically stable, and UA, CXR neg. Vit B12 and TSH wnl  -She remained afebrile and hemodynamically stable -Her mental status improved following admission and on followup today she is alert and oriented x3, lucid expresses remorse about having used cocaine and does not want her family to know. She was counseled to quit and social worker consulted to give resources to quit. She is at her baseline mentation at this time and eager to be discharged home. She is to followup with her  PCP Active Problems:  H/O MYOCARDIAL INFARCTION  -discussed pt with [name] call for [name]) regarding continuing b-blockers in setting of cocaine use due to concern about alpha adrenergic crisis  And he recommends to continue the corge since pt was on iy prior to this and since she is on a small dose. Also discussed with him fact that pt was also has as being on metoprolol as well and he recommended to d/c metoprolol and this was done. Her  Norvasc dose was then increased to 10 mg for better blood pressure control. His to follow up outpatient with her PCP and with a doctor [name] Diabetes mellitus  -continue lantus and SSI  Recent Bereavement- continue xanax. Followup with PCP for possible outpatient referral to psych. GERD  -PPI Deconditioning -PT was consulted and saw patient in hospital and recommended possible skilled nursing, social work talked with patient about this and she does not want to treat nursing facility. She has an aide that comes to her home [facility], she'll also be set up for home health PT ,RN (to assist with medication management and chronic disease management Procedures:  NONE  Consultations: Phone consult with cardiology  Discharge Exam: Filed Vitals:   08/03/12 2019 08/04/12 0622 08/04/12 0929 08/04/12 1242  BP: 123/63 153/65 150/48   Pulse: 83 79 65   Temp: 98.3 F (36.8 C) 97.6 F (36.4 C)    TempSrc: Oral Oral    Resp: 20 20    Weight:  63.8 kg (140 lb 10.5 oz)    SpO2: 99% 100%  98%    Exam:  General: alert & oriented x 1,In NAD, answers simple questions appropriately, some confusion  Cardiovascular: RRR, nl S1 s2  Respiratory: CTAB  Abdomen: soft +BS NT/ND, no masses palpable  Extremities: No cyanosis and no edema  Discharge Instructions  Discharge Orders   Future Orders Complete By Expires     Diet Carb Modified  As directed     Discharge instructions  As directed     Comments:      Please note- Norvasc dose increased from 5  to10 mg and a new prescription given for it AVOID ALL COCAINE USE    Increase activity slowly  As directed         Medication List    STOP taking these medications       metoprolol succinate 50 MG 24 hr tablet  Commonly known as:  TOPROL-XL      TAKE these medications       albuterol 108 (90 BASE) MCG/ACT inhaler  Commonly known as:  PROVENTIL HFA;VENTOLIN HFA  Inhale 1-2 puffs into the lungs every 6 (six) hours as needed for wheezing or shortness of breath.     ALPRAZolam 0.25 MG tablet  Commonly known as:  XANAX  Take 0.25 mg by mouth 2 (two) times daily as needed. For sleep     amLODipine 10 MG tablet  Commonly known as:  NORVASC  Take 1  tablet (10 mg total) by mouth daily.     aspirin 81 MG EC tablet  Take 1 tablet (81 mg total) by mouth daily.     atorvastatin 20 MG tablet  Commonly known as:  LIPITOR  Take 20 mg by mouth daily.     carvedilol 6.25 MG tablet  Commonly known as:  COREG  Take 3.125 mg by mouth 2 (two) times daily with a meal.     famotidine 20 MG tablet  Commonly known as:  PEPCID  Take 1 tablet (20 mg total) by mouth 2 (two) times daily.     insulin glargine 100 UNIT/ML injection  Commonly known as:  LANTUS  Inject 40 Units into the skin 2 (two) times daily.     metFORMIN 500 MG tablet  Commonly known as:  GLUCOPHAGE  Take 1 tablet (500 mg total) by mouth 2 (two) times daily with a meal.     nitroGLYCERIN 0.4 MG SL tablet  Commonly known as:  NITROSTAT  Place 1 tablet (0.4 mg total) under the tongue every 5 (five) minutes as needed. For chest pain     pantoprazole 40 MG tablet  Commonly known as:  PROTONIX  Take 40 mg by mouth daily.     pregabalin 150 MG capsule  Commonly known as:  LYRICA  Take 300 mg by mouth 2 (two) times daily.       No Known Allergies     Follow-up Information   Follow up with Dominique Peers, MD. (in 1week, call for appt upon discharge )    Contact information:   [place]  [address] [phone]       Follow up with Dominique Demark, MD. (in 2weeks, call for appt upon discharge)    Contact information:   [address] [place] [address] [phone]        The results of significant diagnostics from this hospitalization (including imaging, microbiology, ancillary and laboratory) are listed below for reference.    Significant Diagnostic Studies: Dg Chest 2 View  08/02/2012   *RADIOLOGY REPORT*  Clinical Data: Patient continues to day.  CHEST - 2 VIEW  Comparison: 07/07/2012.  Findings: The lungs are not well expanded but appear clear.  There are no effusions or pneumothoraces.  Heart and pulmonary vasculature are normal.  There are no acute bony changes.  IMPRESSION: No active disease.   Original Report Authenticated By: Vallery Ridge, M.D.   Dg Chest 2 View  07/07/2012   *RADIOLOGY REPORT*  Clinical Data: Chest pain for 8 hours  CHEST - 2 VIEW  Comparison: 05/12/2012  Findings: Cardiac leads overlie the chest.  Heart size is at upper limits of normal.  Lung volumes are low but clear.  No pleural effusion.  No acute osseous finding. Aortic atherosclerotic calcification noted.  IMPRESSION: No acute cardiopulmonary process.   Original Report Authenticated By: Conchita Paris, M.D.   Ct Head Wo Contrast  08/02/2012   *RADIOLOGY REPORT*  Clinical Data: Severe headache, altered mental status  CT HEAD WITHOUT CONTRAST  Technique:  Contiguous axial images were obtained from the base of the skull through the vertex without contrast.  Comparison: None.  Findings:  There are rather extensive scattered periventricular hypodensities, left slightly greater than right, compatible with microvascular ischemic disease.  Given background parenchymal abnormalities, there is no CT evidence of acute large territory infarct.  No intraparenchymal or extra-axial mass or hemorrhage.  Normal size and configuration of the  ventricles and basilar cisterns.  No midline shift.   Limited visualization of the paranasal sinuses demonstrates under pneumatization of the right frontal sinus.  The remaining paranasal sinuses and mastoid air cells are normally aerated.  No displaced calvarial fracture.  Regional soft tissues are normal.  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   Original Report Authenticated By: Jake Seats, MD    Microbiology: No results found for this or any previous visit (from the past 240 hour(s)).   Labs: Basic Metabolic Panel:  Recent Labs Lab 08/02/12 1541 08/03/12 0630  NA 140 138  K 3.6 3.9  CL 105 104  CO2 25 22  GLUCOSE 119* 86  BUN 13 12  CREATININE 1.09 1.15*  CALCIUM 9.5 8.9   Liver Function Tests:  Recent Labs Lab 08/02/12 1541 08/03/12 0630  AST 14 13  ALT 11 9  ALKPHOS 138* 126*  BILITOT 0.5 0.4  PROT 8.5* 8.1  ALBUMIN 3.5 3.3*   No results found for this basename: LIPASE, AMYLASE,  in the last 168 hours  Recent Labs Lab 08/02/12 1830  AMMONIA 28   CBC:  Recent Labs Lab 08/02/12 1541  WBC 9.0  HGB 9.5*  HCT 33.1*  MCV 74.7*  PLT 412*   Cardiac Enzymes:  Recent Labs Lab 08/03/12 1043 08/03/12 1553  TROPONINI <0.30 <0.30   BNP: BNP (last 3 results)  Recent Labs  05/12/12 1247  PROBNP 20.8   CBG:  Recent Labs Lab 08/03/12 1047 08/03/12 1631 08/03/12 2135 08/04/12 0749 08/04/12 1115  GLUCAP 94 144* 148* 102* 92       Signed:  Donyale Falcon C  [facility] 08/04/2012, 12:48 PM

## 2012-08-04 NOTE — Care Management Note (Signed)
    Page 1 of 1   08/04/2012     1:59:42 PM   CARE MANAGEMENT NOTE 08/04/2012  Patient:  JESMINE, DURRAH   Account Number:  [id number]  Date Initiated:  08/04/2012  Documentation initiated by:  Llana Aliment  Subjective/Objective Assessment:   62yo female admitted with Acute Encephalopathy. Pt. lives with son.     Action/Plan:   discharge planning   Anticipated DC Date:  08/04/2012   Anticipated DC Plan:  [place]  CM consult      [facility] Choice  HOME HEALTH   Choice offered to / List presented to:  C-1 Patient        [place] arranged  HH-1 RN      [place].   Status of service:  Completed, signed off Medicare Important Message given?   (If response is "NO", the following Medicare IM given date fields will be blank) Date Medicare IM given:   Date Additional Medicare IM given:    Discharge Disposition:  [place]  Per UR Regulation:  Reviewed for med. necessity/level of care/duration of stay  If discussed at [place] of Stay Meetings, dates discussed:    Comments:  08/04/12 1345 In to speak with pt. about home health services.  Pt. has a home health aide from [facility] 4days/week for 4hrs a day.  Pt. has had [facility] in past and was satisfied with their servcies.  TC to Lelan Pons, with [facility], to give referral for [facility] RN.  Pt. has Medicaid, and would not be able to obtain [facility] PT.  Pt. to dc home today. Llana Aliment, RN, BSN NCM [phone]

## 2012-08-05 LAB — METHYLMALONIC ACID, SERUM: Methylmalonic Acid, Quantitative: <0.09 umol/L (ref <0.40)

## 2012-10-08 ENCOUNTER — Encounter: Payer: Self-pay | Admitting: *Deleted

## 2012-10-24 ENCOUNTER — Encounter: Payer: Self-pay | Admitting: Internal Medicine

## 2012-10-24 ENCOUNTER — Ambulatory Visit (INDEPENDENT_AMBULATORY_CARE_PROVIDER_SITE_OTHER): Payer: Medicaid Other | Admitting: Internal Medicine

## 2012-10-24 VITALS — BP 140/60 | HR 80 | Ht 58.75 in | Wt 146.5 lb

## 2012-10-24 DIAGNOSIS — D649 Anemia, unspecified: Secondary | ICD-10-CM

## 2012-10-24 DIAGNOSIS — D509 Iron deficiency anemia, unspecified: Secondary | ICD-10-CM

## 2012-10-24 DIAGNOSIS — R195 Other fecal abnormalities: Secondary | ICD-10-CM

## 2012-10-24 NOTE — Progress Notes (Signed)
[name] 1950-02-14 MRN [id number]  History of Present Illness:  This is a 62 year old African American female with severe iron deficiency anemia and Hemoccult-positive stool. Her hemoglobin in June 2014 was 9.5, hematocrit was 33.1 with an MCV of 74. In July 2014, her hemoglobin was 9.7, her hematocrit was 33.7 with an MCV of 78. She was admitted in June 2014 with toxic encephalopathy due to cocaine. She had a full GI workup for heme positive anemia in October 2013. Her upper endoscopy was normal and colonoscopy showed a hyperplastic polyp. She is status post right hemicolectomy with patent anastomosis. She has a history procedure related complcation during 2009colonoscopy and polypectomy leading to perforation . The exam was done because of iron deficiency anemia. She denies any visible blood per rectum. She denies abdominal pain. She has been on aspirin 81 mg daily and Protonix 40 mg daily as well as iron supplements.   Past Medical History  Diagnosis Date  . History of colonic polyps   . GERD (gastroesophageal reflux disease)   . Hyperlipidemia   . Diabetes mellitus without mention of complication   . Hypertension   . Myocardial infarction   . Coronary artery disease   . Fatty liver disease, nonalcoholic   . Cholelithiasis   . Iron deficiency anemia   . Angina   . Shortness of breath   . Asthma   . Arthritis     HANDS"  . Cocaine abuse 07/2012    per [facility] drug screen   Past Surgical History  Procedure Laterality Date  . Right colectomy      After colonic perforation  . Tubal ligation    . Coronary angioplasty with stent placement    . Esophagogastroduodenoscopy  11/30/2011    Procedure: ESOPHAGOGASTRODUODENOSCOPY (EGD);  Surgeon: Lafayette Dragon, MD;  Location: [facility];  Service: Endoscopy;  Laterality: N/A;  . Colonoscopy  11/30/2011    Procedure: COLONOSCOPY;  Surgeon: Lafayette Dragon, MD;  Location: [facility] ENDOSCOPY;  Service: Endoscopy;  Laterality: N/A;    reports  that she quit smoking about 2 years ago. Her smoking use included Cigarettes. She has a 23.5 pack-year smoking history. She has never used smokeless tobacco. She reports that she uses illicit drugs (Cocaine). She reports that she does not drink alcohol. family history includes Cancer in her father; Diabetes in her mother and sister; Heart disease in her mother. There is no history of Colon cancer. No Known Allergies      Review of Systems: Negative for dysphagia abdominal pain vomiting weight loss  The remainder of the 10 point ROS is negative except as outlined in H&P   Physical Exam: General appearance  Well developed, in no distress. Eyes- non icteric. HEENT nontraumatic, normocephalic. Mouth no lesions, tongue papillated, no cheilosis. Neck supple without adenopathy, thyroid not enlarged, no carotid bruits, no JVD. Lungs Clear to auscultation bilaterally. Cor normal S1, normal S2, regular rhythm, no murmur,  quiet precordium. Abdomen: Protuberant with mild diffuse tenderness but no palpable mass. No ascites. Liver edge is costal margin. Rectal: Soft Hemoccult-positive stool. Extremities no pedal edema. Skin no lesions. Neurological alert and oriented x 3. Psychological normal mood and affect.  Assessment and Plan:  Problem #46 62 year old [place] American female with heme positive stool and severe anemia, iron saturation 6%, despite taking iron supplements. She has  had a negative upper endoscopy and colonoscopy. We will proceed with a small bowel capsule endoscopy to look for AVMs or  small bowel lesions such as  benign tumors. I doubt that she has Crohn's disease. We will also set her up for iron infusion to boost her iron stores.   10/24/2012 Delfin Edis

## 2012-10-24 NOTE — Patient Instructions (Addendum)
You have been scheduled for a small bowel capsule endoscopy at [facility] (3rd floor) on 11/18/12 at 8:00 am. Please follow the written instructions given to you at your appointment today.  You have been scheduled for iron infusions at [facility]. Your first infusion is on 10/31/12 @ 11:00 am. You will need to arrive at 10:45 am for registration. Your next infusion is scheduled for 11/07/12 @ 3:00pm. You will need to arrive at 2:45 pm for registration.  Dr Lucianne Lei

## 2012-10-31 ENCOUNTER — Other Ambulatory Visit (HOSPITAL_COMMUNITY): Payer: Self-pay | Admitting: Internal Medicine

## 2012-10-31 ENCOUNTER — Ambulatory Visit (HOSPITAL_COMMUNITY): Admission: RE | Admit: 2012-10-31 | Payer: Medicaid Other | Source: Ambulatory Visit

## 2012-11-03 ENCOUNTER — Telehealth: Payer: Self-pay | Admitting: *Deleted

## 2012-11-03 NOTE — Telephone Encounter (Signed)
Just wanted to let you know that this patient was a "no show" for their Feraheme today   Beatris Si RN   [facility] Short Stay

## 2012-11-03 NOTE — Telephone Encounter (Signed)
                 [name], please call pt to arrange appointment if she is interested discussing dysphagia/reflux.

## 2012-11-07 ENCOUNTER — Ambulatory Visit (HOSPITAL_COMMUNITY)
Admission: RE | Admit: 2012-11-07 | Discharge: 2012-11-07 | Disposition: A | Discharge status: Home / Self Care | Payer: Medicaid Other | Source: Ambulatory Visit | Attending: Internal Medicine | Admitting: Internal Medicine

## 2012-11-07 NOTE — Progress Notes (Signed)
Patient did not come to [facility] short stay for Feraheme infusion today 11/07/12.

## 2012-11-12 ENCOUNTER — Encounter (HOSPITAL_COMMUNITY): Payer: Self-pay | Admitting: Emergency Medicine

## 2012-11-12 ENCOUNTER — Emergency Department (HOSPITAL_COMMUNITY): Payer: Medicaid Other

## 2012-11-12 ENCOUNTER — Emergency Department (HOSPITAL_COMMUNITY)
Admission: EM | Admit: 2012-11-12 | Discharge: 2012-11-12 | Disposition: A | Discharge status: Home / Self Care | Payer: Medicaid Other | Attending: Emergency Medicine | Admitting: Emergency Medicine

## 2012-11-12 DIAGNOSIS — Z9861 Coronary angioplasty status: Secondary | ICD-10-CM | POA: Insufficient documentation

## 2012-11-12 DIAGNOSIS — K219 Gastro-esophageal reflux disease without esophagitis: Secondary | ICD-10-CM | POA: Insufficient documentation

## 2012-11-12 DIAGNOSIS — J45901 Unspecified asthma with (acute) exacerbation: Secondary | ICD-10-CM | POA: Insufficient documentation

## 2012-11-12 DIAGNOSIS — Z79899 Other long term (current) drug therapy: Secondary | ICD-10-CM | POA: Insufficient documentation

## 2012-11-12 DIAGNOSIS — R1032 Left lower quadrant pain: Secondary | ICD-10-CM | POA: Insufficient documentation

## 2012-11-12 DIAGNOSIS — Z860100 Personal history of colon polyps, unspecified: Secondary | ICD-10-CM | POA: Insufficient documentation

## 2012-11-12 DIAGNOSIS — I252 Old myocardial infarction: Secondary | ICD-10-CM | POA: Insufficient documentation

## 2012-11-12 DIAGNOSIS — I10 Essential (primary) hypertension: Secondary | ICD-10-CM | POA: Insufficient documentation

## 2012-11-12 DIAGNOSIS — Z794 Long term (current) use of insulin: Secondary | ICD-10-CM | POA: Insufficient documentation

## 2012-11-12 DIAGNOSIS — M19049 Primary osteoarthritis, unspecified hand: Secondary | ICD-10-CM | POA: Insufficient documentation

## 2012-11-12 DIAGNOSIS — E119 Type 2 diabetes mellitus without complications: Secondary | ICD-10-CM | POA: Insufficient documentation

## 2012-11-12 DIAGNOSIS — Z87891 Personal history of nicotine dependence: Secondary | ICD-10-CM | POA: Insufficient documentation

## 2012-11-12 DIAGNOSIS — E785 Hyperlipidemia, unspecified: Secondary | ICD-10-CM | POA: Insufficient documentation

## 2012-11-12 DIAGNOSIS — D509 Iron deficiency anemia, unspecified: Secondary | ICD-10-CM | POA: Insufficient documentation

## 2012-11-12 DIAGNOSIS — I2510 Atherosclerotic heart disease of native coronary artery without angina pectoris: Secondary | ICD-10-CM | POA: Insufficient documentation

## 2012-11-12 DIAGNOSIS — R739 Hyperglycemia, unspecified: Secondary | ICD-10-CM

## 2012-11-12 LAB — CBC WITH DIFFERENTIAL/PLATELET
Eosinophils Absolute: 0.2 10*3/uL (ref 0.0–0.7)
Eosinophils Relative: 3 % (ref 0–5)
Hemoglobin: 12.4 g/dL (ref 12.0–15.0)
Lymphs Abs: 2.5 10*3/uL (ref 0.7–4.0)
MCH: 28.2 pg (ref 26.0–34.0)
MCV: 90.9 fL (ref 78.0–100.0)
Monocytes Relative: 10 % (ref 3–12)
RBC: 4.39 MIL/uL (ref 3.87–5.11)

## 2012-11-12 LAB — URINALYSIS, ROUTINE W REFLEX MICROSCOPIC
Bilirubin Urine: NEGATIVE
Nitrite: NEGATIVE
Specific Gravity, Urine: 1.013 (ref 1.005–1.030)
Urobilinogen, UA: 0.2 mg/dL (ref 0.0–1.0)

## 2012-11-12 LAB — COMPREHENSIVE METABOLIC PANEL
BUN: 12 mg/dL (ref 6–23)
Calcium: 9.5 mg/dL (ref 8.4–10.5)
GFR calc Af Amer: 81 mL/min — ABNORMAL LOW (ref >90)
Glucose, Bld: 125 mg/dL — ABNORMAL HIGH (ref 70–99)
Total Protein: 8.6 g/dL — ABNORMAL HIGH (ref 6.0–8.3)

## 2012-11-12 LAB — GLUCOSE, CAPILLARY: Glucose-Capillary: 144 mg/dL — ABNORMAL HIGH (ref 70–99)

## 2012-11-12 LAB — TROPONIN I: Troponin I: <0.3 ng/mL (ref <0.30)

## 2012-11-12 LAB — URINE MICROSCOPIC-ADD ON

## 2012-11-12 LAB — LIPASE, BLOOD: Lipase: 40 U/L (ref 11–59)

## 2012-11-12 MED ORDER — SODIUM CHLORIDE 0.9 % IV SOLN
Freq: Once | INTRAVENOUS | Status: AC
  Administered 2012-11-12: 15:00:00 via INTRAVENOUS

## 2012-11-12 MED ORDER — SODIUM CHLORIDE 0.9 % IV BOLUS (SEPSIS)
500.0000 mL | Freq: Once | INTRAVENOUS | Status: AC
  Administered 2012-11-12: 500 mL via INTRAVENOUS

## 2012-11-12 MED ORDER — SODIUM CHLORIDE 0.9 % IV SOLN: Freq: Once | INTRAVENOUS | Status: DC

## 2012-11-12 NOTE — ED Provider Notes (Addendum)
CSN: [id number]     Arrival date & time 11/12/12  1218 History   First MD Initiated Contact with Patient 11/12/12 1527     Chief Complaint  Patient presents with  . Abdominal Pain  . Dizziness   (Consider location/radiation/quality/duration/timing/severity/associated sxs/prior Treatment) Patient is a 62 y.o. female presenting with abdominal pain. The history is provided by the patient.  Abdominal Pain Pain location:  Suprapubic and LLQ Pain quality: aching   Pain radiates to:  Does not radiate Pain severity:  Mild Onset quality:  Gradual Duration: unknown. Timing:  Intermittent Progression:  Unchanged Chronicity:  Recurrent Context: not diet changes, not eating and not recent illness   Relieved by:  Nothing Worsened by:  Nothing tried Ineffective treatments:  None tried Associated symptoms: no anorexia, no chest pain, no chills, no constipation, no cough, no diarrhea, no dysuria, no fever, no hematemesis, no nausea and no shortness of breath   Associated symptoms comment:  Pt states for the last few days her blood sugar is running in the 400's for some reason despite eating appropriate DM diet.  Also states fell out of bed a few days ago and having HA and dizziness worse with standing.  Denies any near syncope and syncope. Risk factors: aspirin and being elderly   Risk factors: no alcohol abuse   Risk factors comment:  Prior hx of GI bleeding and to see GI next week for camera to try to localize the bleed   Past Medical History  Diagnosis Date  . History of colonic polyps   . GERD (gastroesophageal reflux disease)   . Hyperlipidemia   . Diabetes mellitus without mention of complication   . Hypertension   . Myocardial infarction   . Coronary artery disease   . Fatty liver disease, nonalcoholic   . Cholelithiasis   . Iron deficiency anemia   . Angina   . Shortness of breath   . Asthma   . Arthritis     HANDS"  . Cocaine abuse 07/2012    per [facility] drug screen   Past  Surgical History  Procedure Laterality Date  . Right colectomy      After colonic perforation  . Tubal ligation    . Coronary angioplasty with stent placement    . Esophagogastroduodenoscopy  11/30/2011    Procedure: ESOPHAGOGASTRODUODENOSCOPY (EGD);  Surgeon: Lafayette Dragon, MD;  Location: [facility];  Service: Endoscopy;  Laterality: N/A;  . Colonoscopy  11/30/2011    Procedure: COLONOSCOPY;  Surgeon: Lafayette Dragon, MD;  Location: [facility];  Service: Endoscopy;  Laterality: N/A;   Family History  Problem Relation Age of Onset  . Heart disease Mother   . Diabetes Mother   . Colon cancer Neg Hx   . Cancer Father     unsure what kind  . Diabetes Sister    History  Substance Use Topics  . Smoking status: Former Smoker -- 0.50 packs/day for 47 years    Types: Cigarettes    Quit date: 05/09/2010  . Smokeless tobacco: Never Used  . Alcohol Use: No   OB History   Grav Para Term Preterm Abortions TAB SAB Ect Mult Living                 Review of Systems  Constitutional: Negative for fever and chills.  Respiratory: Negative for cough and shortness of breath.   Cardiovascular: Negative for chest pain.  Gastrointestinal: Positive for abdominal pain. Negative for nausea, diarrhea, constipation, anorexia and hematemesis.  Genitourinary: Negative for dysuria.  All other systems reviewed and are negative.    Allergies  Review of patient's allergies indicates no known allergies.  Home Medications   Current Outpatient Rx  Name  Route  Sig  Dispense  Refill  . albuterol (PROVENTIL HFA;VENTOLIN HFA) 108 (90 BASE) MCG/ACT inhaler   Inhalation   Inhale 1-2 puffs into the lungs every 6 (six) hours as needed for wheezing or shortness of breath.   1 Inhaler   0   . ALPRAZolam (XANAX) 0.25 MG tablet   Oral   Take 0.25 mg by mouth 2 (two) times daily.          Marland Kitchen amLODipine (NORVASC) 5 MG tablet   Oral   Take 5 mg by mouth daily.         Marland Kitchen aspirin EC 81 MG EC tablet    Oral   Take 1 tablet (81 mg total) by mouth daily.   30 tablet   0   . atorvastatin (LIPITOR) 20 MG tablet   Oral   Take 20 mg by mouth daily.         . budesonide-formoterol (SYMBICORT) 160-4.5 MCG/ACT inhaler   Inhalation   Inhale 2 puffs into the lungs 2 (two) times daily.         . Insulin Glargine (LANTUS SOLOSTAR) 100 UNIT/ML SOPN   Subcutaneous   Inject 40 Units into the skin 2 (two) times daily.         . Iron-FA-B Cmp-C-Biot-Probiotic (FUSION PLUS PO)   Oral   Take 1 capsule by mouth daily.         . metFORMIN (GLUCOPHAGE) 500 MG tablet   Oral   Take 1 tablet (500 mg total) by mouth 2 (two) times daily with a meal.   60 tablet   0   . metoprolol succinate (TOPROL-XL) 50 MG 24 hr tablet   Oral   Take 50 mg by mouth every evening. Take with or immediately following a meal.         . nitroGLYCERIN (NITROSTAT) 0.4 MG SL tablet   Sublingual   Place 1 tablet (0.4 mg total) under the tongue every 5 (five) minutes as needed. For chest pain   10 tablet   0   . pantoprazole (PROTONIX) 40 MG tablet   Oral   Take 40 mg by mouth daily.         . pregabalin (LYRICA) 150 MG capsule   Oral   Take 150 mg by mouth 2 (two) times daily.           BP 158/53  Pulse 86  Temp(Src) 98.8 F (37.1 C) (Oral)  Resp 16  SpO2 99% Physical Exam  Nursing note and vitals reviewed. Constitutional: She is oriented to person, place, and time. She appears well-developed and well-nourished. No distress.  HENT:  Head: Normocephalic and atraumatic.  Mouth/Throat: Oropharynx is clear and moist.  Eyes: Conjunctivae and EOM are normal. Pupils are equal, round, and reactive to light.  Neck: Normal range of motion. Neck supple. No spinous process tenderness and no muscular tenderness present.  Cardiovascular: Normal rate, regular rhythm and intact distal pulses.   No murmur heard. Pulmonary/Chest: Effort normal and breath sounds normal. No respiratory distress. She has no  wheezes. She has no rales.  Abdominal: Soft. She exhibits no distension. There is tenderness in the suprapubic area and left lower quadrant. There is no rebound and no guarding.  Mild tenderness  Musculoskeletal: Normal range  of motion. She exhibits no edema and no tenderness.  Neurological: She is alert and oriented to person, place, and time.  Skin: Skin is warm and dry. No rash noted. No erythema.  Psychiatric: She has a normal mood and affect. Her behavior is normal.    ED Course  Procedures (including critical care time) Labs Review Labs Reviewed  CBC WITH DIFFERENTIAL - Abnormal; Notable for the following:    RDW 16.4 (*)    All other components within normal limits  COMPREHENSIVE METABOLIC PANEL - Abnormal; Notable for the following:    Glucose, Bld 125 (*)    Total Protein 8.6 (*)    Alkaline Phosphatase 118 (*)    GFR calc non Af Amer 70 (*)    GFR calc Af Amer 81 (*)    All other components within normal limits  URINALYSIS, ROUTINE W REFLEX MICROSCOPIC - Abnormal; Notable for the following:    Leukocytes, UA SMALL (*)    All other components within normal limits  GLUCOSE, CAPILLARY - Abnormal; Notable for the following:    Glucose-Capillary 144 (*)    All other components within normal limits  URINE MICROSCOPIC-ADD ON - Abnormal; Notable for the following:    Squamous Epithelial / LPF FEW (*)    Bacteria, UA FEW (*)    All other components within normal limits  LIPASE, BLOOD  TROPONIN I   Imaging Review Ct Head Wo Contrast  11/12/2012   CLINICAL DATA:  Dizziness  EXAM: CT HEAD WITHOUT CONTRAST  TECHNIQUE: Contiguous axial images were obtained from the base of the skull through the vertex without intravenous contrast.  COMPARISON:  08/02/2012  FINDINGS: There is prominent and confluent periventricular white matter disease. No mass effect, midline shift, or acute intracranial hemorrhage. Ventricular system and extra-axial space are within normal limits.  Mastoid air  cells are clear. Air-fluid levels in both maxillary sinuses. Ethmoid air cells are clear. Frontal sinuses are hypoplastic and clear. No skull fracture. No destructive bone lesion.  IMPRESSION: Stable examination with diffuse white matter disease. This likely represents chronic ischemic changes.   Electronically Signed   By: Maryclare Bean M.D.   On: 11/12/2012 18:44   Dg Abd Acute W/chest  11/12/2012   CLINICAL DATA:  Left lower quadrant pain  EXAM: ACUTE ABDOMEN SERIES (ABDOMEN 2 VIEW & CHEST 1 VIEW)  COMPARISON:  08/02/2012  FINDINGS: Moderate cardiomegaly. Normal vascularity. Low lung volumes. There is opacity at the lateral left base which obscures the hemidiaphragm. Upper lungs are clear. No pneumothorax.  There is no free intraperitoneal gas on the left decubitus image. There are no disproportionally dilated loops of bowel. Nonspecific air-fluid levels in the colon. Postoperative changes in the right hemi and with surgical staples for bowel anastomosis are noted.  IMPRESSION: There is atelectasis versus airspace disease at the lateral left lung base.  Cardiomegaly without edema.  Nonobstructive bowel gas pattern. Postoperative changes are noted in the right abdomen.   Electronically Signed   By: Maryclare Bean M.D.   On: 11/12/2012 16:28     Date: 11/12/2012  Rate: 78  Rhythm: normal sinus rhythm  QRS Axis: left  Intervals: normal  ST/T Wave abnormalities: nonspecific T wave changes  Conduction Disutrbances:none  Narrative Interpretation:   Old EKG Reviewed: unchanged    MDM   1. Hyperglycemia     Stating that her sugars have been running in the 400s starting last night and tonight despite not eating and taking her medications appropriately. She is  unsure why her blood sugars have been running high. She states last night she had some mild abdominal tenderness which is resolved today. She does have a history of GI bleeding and states occasionally her stools are dark but she has not noticed that in  the last few days. She states the dizziness seems to be most pronounced when she is standing but denies any syncope or near syncopal event. She denies any respiratory symptoms or shortness of breath. No nausea vomiting or diarrhea. She denies any chest pain. On exam here patient has no focal findings except for some mild suprapubic and left lower quadrant tenderness. Her blood pressure is normal with a normal pulse and temperature.  EKG is unchanged from prior CBC with a stable hemoglobin of 12 and CMP with a blood sugar of 125 and otherwise normal creatinine and liver function. Troponin is negative and UA is pending. Possibility for urinary tract infection causing patient's fluctuating blood sugars however with the medicine she took at home her blood sugar is currently stable. Acute abdominal series and head CT pending as patient states she fell out of bed several days ago and made her head but has intermittent headaches and dizziness.  She denies any medication changes however she does take blood pressure medication which could be causing orthostatic symptoms versus fluctuating blood sugars may also be causing her symptoms.  7:17 PM Head CT neg.  Pt eating and drinking without difficulty.  Will d/c home to f/u with [facility].  Blanchie Dessert, MD 11/12/12 1918  Blanchie Dessert, MD 11/12/12 [id number]

## 2012-11-12 NOTE — ED Notes (Signed)
Per ems, dizziness for the last two days, more so when standing up. Denies syncopal episodes, denies falling. Abdominal pain last night, hx of GI bleed, appointment on 14th to pinpoint location of bleed. Abdomen distended, nontender on palpation. 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 No orthostatic changes. 154/64, 88, 18, 98%, per pt, BS 404, pt took lantus, ems BS 202.

## 2012-11-12 NOTE — ED Notes (Signed)
Pt states shes on breathing machine at home, pt placed on 2L [place]

## 2012-11-12 NOTE — ED Notes (Signed)
CT contacted about pt wait time. Pt 2nd in line.

## 2012-11-12 NOTE — ED Notes (Addendum)
Patient is comfortable.

## 2012-11-12 NOTE — ED Notes (Signed)
MD at bedside. 

## 2012-11-12 NOTE — ED Notes (Signed)
Pt c/o midsternal chest pain.

## 2012-11-13 ENCOUNTER — Other Ambulatory Visit (HOSPITAL_COMMUNITY): Payer: Self-pay | Admitting: Family Medicine

## 2012-11-13 DIAGNOSIS — Z1231 Encounter for screening mammogram for malignant neoplasm of breast: Secondary | ICD-10-CM

## 2012-11-14 ENCOUNTER — Ambulatory Visit (HOSPITAL_COMMUNITY): Payer: Medicaid Other

## 2012-11-17 ENCOUNTER — Telehealth: Payer: Self-pay | Admitting: Internal Medicine

## 2012-11-17 NOTE — Telephone Encounter (Signed)
Pt had questions about eating today with a pill camera scheduled for tomorrow. Explained to pt she could have had a light breakfast, but the rest of the day, she is on a clear liquid diet; she can have jello and popsicles. Pt asked if she can have Pepsi and I told her yes; we went over the prep again and she stated understanding.

## 2012-11-18 ENCOUNTER — Ambulatory Visit (INDEPENDENT_AMBULATORY_CARE_PROVIDER_SITE_OTHER): Payer: Medicaid Other | Admitting: Internal Medicine

## 2012-11-18 ENCOUNTER — Telehealth: Payer: Self-pay | Admitting: Internal Medicine

## 2012-11-18 DIAGNOSIS — D500 Iron deficiency anemia secondary to blood loss (chronic): Secondary | ICD-10-CM

## 2012-11-18 DIAGNOSIS — K921 Melena: Secondary | ICD-10-CM

## 2012-11-18 NOTE — Telephone Encounter (Signed)
Patient states she is feeling some nausea and is hungry. Suggested she try ginger ale or Sprite. Patient states she does not have any of this but will try to get some. She will call back if worsens.

## 2012-11-18 NOTE — Progress Notes (Signed)
Patient arrived for SBCE. Patient has done prep for procedure and has not eaten.Patient given instructions and verbalizes understanding. Capsule swallowed without difficulty. Patient returned in afternoon with computer intact. Lot [id number].

## 2012-11-18 NOTE — Telephone Encounter (Signed)
Spoke with patient and she is doing ok.

## 2012-11-24 ENCOUNTER — Ambulatory Visit (HOSPITAL_COMMUNITY)
Admission: RE | Admit: 2012-11-24 | Discharge: 2012-11-24 | Disposition: A | Discharge status: Home / Self Care | Payer: Medicaid Other | Source: Ambulatory Visit | Attending: Family Medicine | Admitting: Family Medicine

## 2012-11-24 DIAGNOSIS — Z1231 Encounter for screening mammogram for malignant neoplasm of breast: Secondary | ICD-10-CM | POA: Insufficient documentation

## 2012-11-28 ENCOUNTER — Other Ambulatory Visit: Payer: Self-pay | Admitting: Family Medicine

## 2012-11-28 DIAGNOSIS — R928 Other abnormal and inconclusive findings on diagnostic imaging of breast: Secondary | ICD-10-CM

## 2012-12-10 ENCOUNTER — Telehealth: Payer: Self-pay | Admitting: Internal Medicine

## 2012-12-10 DIAGNOSIS — D649 Anemia, unspecified: Secondary | ICD-10-CM

## 2012-12-10 NOTE — Telephone Encounter (Signed)
Patient calling for SBCE results. Please, advise.

## 2012-12-10 NOTE — Telephone Encounter (Signed)
SBCE shows multiple avm's in the small bowl, these are causing GI bleed. She needs to continue Iron supplements daily and for a long period of time.Please recheck Hgb in 4 weeks., also iron levels

## 2012-12-11 NOTE — Telephone Encounter (Signed)
Patient given results and recommendations. Labs in EPIC.

## 2012-12-16 ENCOUNTER — Ambulatory Visit
Admission: RE | Admit: 2012-12-16 | Discharge: 2012-12-16 | Disposition: A | Discharge status: Home / Self Care | Payer: PRIVATE HEALTH INSURANCE | Source: Ambulatory Visit | Attending: Family Medicine | Admitting: Family Medicine

## 2012-12-16 DIAGNOSIS — R928 Other abnormal and inconclusive findings on diagnostic imaging of breast: Secondary | ICD-10-CM

## 2013-01-08 ENCOUNTER — Telehealth: Payer: Self-pay | Admitting: *Deleted

## 2013-01-08 NOTE — Telephone Encounter (Signed)
Unable to reach patient and unable to leave message. Will try again later.

## 2013-01-08 NOTE — Telephone Encounter (Signed)
Message copied by Hulan Saas on Thu Jan 08, 2013  9:04 AM ------      Message from: Hulan Saas      Created: Thu Dec 11, 2012 10:12 AM       Call and remind patient due for cbc, iron level for db on 01/12/13 ------

## 2013-01-09 ENCOUNTER — Encounter: Payer: Self-pay | Admitting: *Deleted

## 2013-01-09 NOTE — Telephone Encounter (Signed)
Patient unavailable and unable to leave a message. Letter mailed to patient with reminder that labs are due.

## 2013-01-22 ENCOUNTER — Telehealth: Payer: Self-pay | Admitting: *Deleted

## 2013-01-22 NOTE — Telephone Encounter (Signed)
Patient is due for CBC. Tried multiple times to reach her by phone without success. Mailed her a letter requesting she come for labs. Patient has not responded.

## 2013-01-22 NOTE — Telephone Encounter (Signed)
Message copied by Hulan Saas on Thu Jan 22, 2013  9:49 AM ------      Message from: Hulan Saas      Created: Fri Jan 09, 2013  9:20 AM       Did patient have CBC, iron studies for DB? Letter mailed ------

## 2013-05-25 ENCOUNTER — Other Ambulatory Visit: Payer: Self-pay | Admitting: Family Medicine

## 2013-05-25 DIAGNOSIS — R921 Mammographic calcification found on diagnostic imaging of breast: Secondary | ICD-10-CM

## 2013-06-15 ENCOUNTER — Ambulatory Visit
Admission: RE | Admit: 2013-06-15 | Discharge: 2013-06-15 | Disposition: A | Discharge status: Home / Self Care | Payer: Medicare Other | Source: Ambulatory Visit | Attending: Family Medicine | Admitting: Family Medicine

## 2013-06-15 DIAGNOSIS — R921 Mammographic calcification found on diagnostic imaging of breast: Secondary | ICD-10-CM

## 2013-06-28 IMAGING — CR DG CHEST 1V PORT
1 series · 1 of 1 positions shown · non-contrast
Comparison: 08/23/2009

CLINICAL DATA: Chest pain

PORTABLE CHEST - 1 VIEW

[view not recorded]
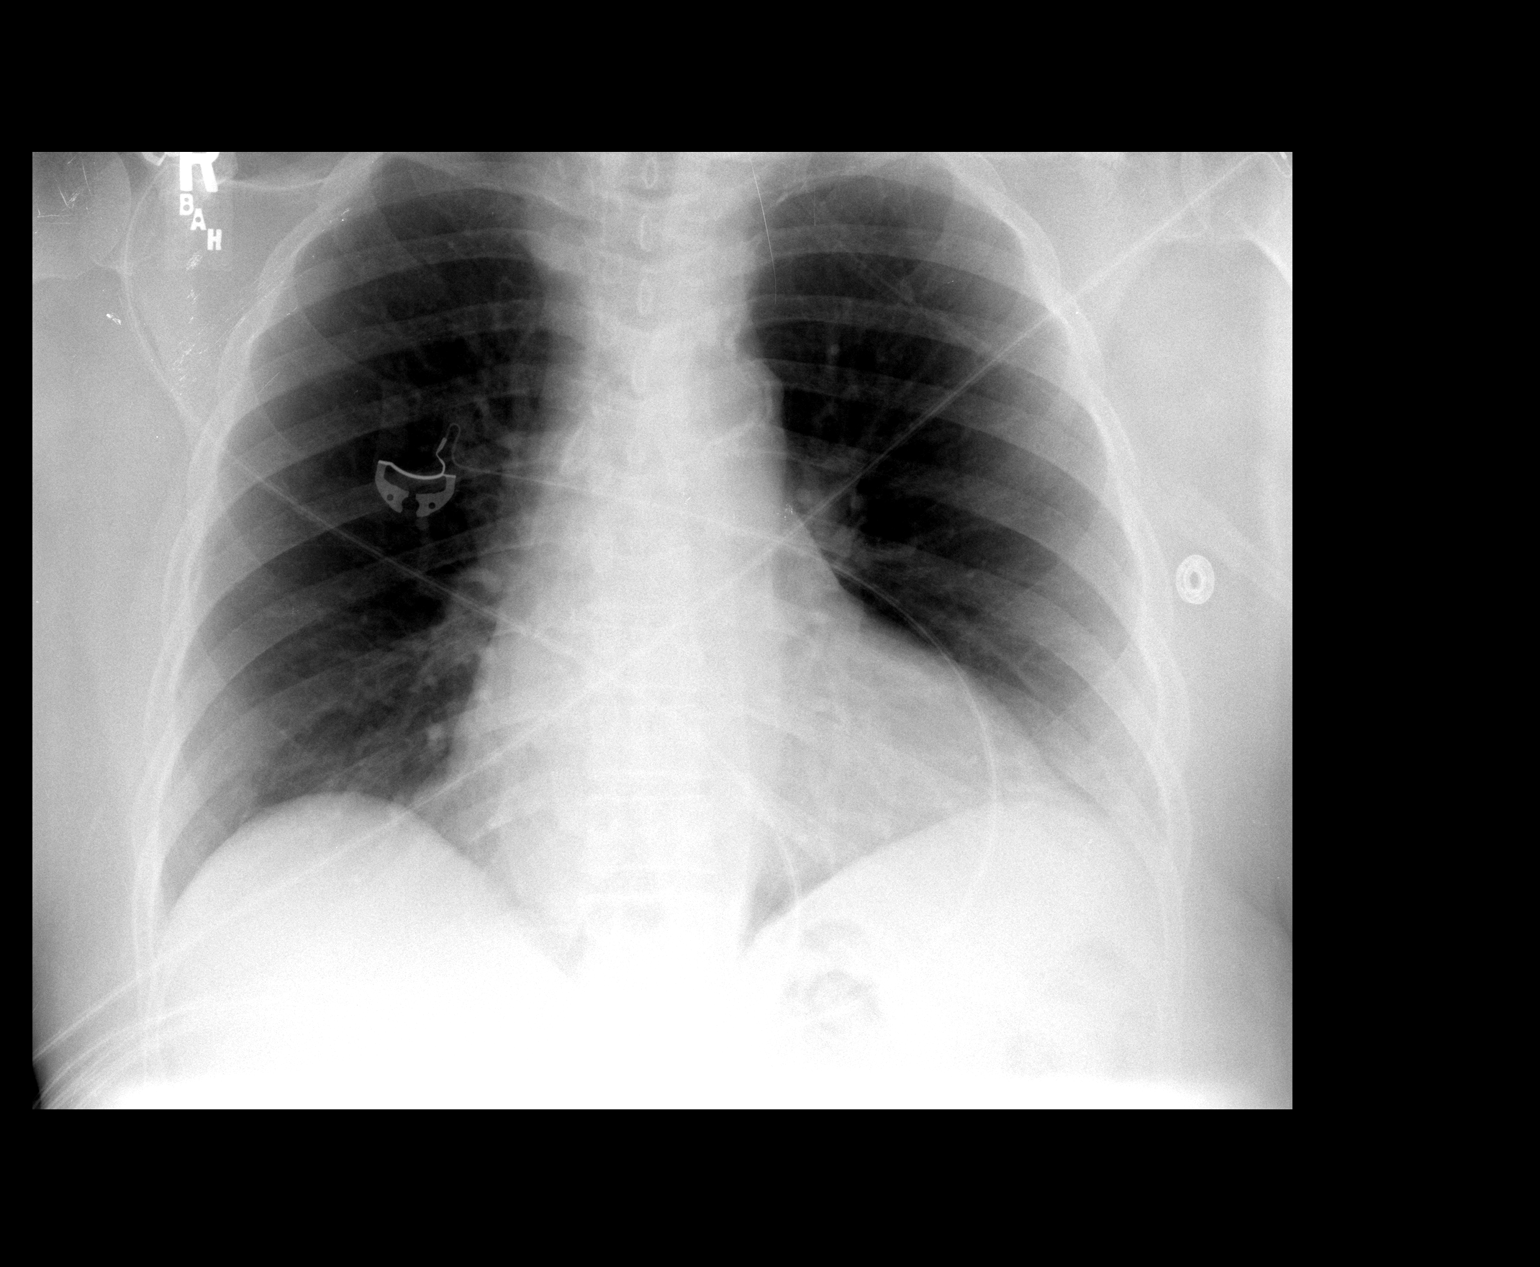

[1 of 1 positions shown; findings below may reference images not displayed]

FINDINGS: Lungs are clear. No pleural effusion or pneumothorax.

Stable mild cardiomegaly.
IMPRESSION: No evidence of acute cardiopulmonary disease.

Stable mild cardiomegaly.

## 2013-07-13 IMAGING — US US ABDOMEN COMPLETE
1 series · 14 of 25 positions shown · non-contrast
Comparison: 04/28/2009 and CT of 04/26/2009

CLINICAL DATA: Epigastric abdominal pain.  Hypertension and
diabetes.

COMPLETE ABDOMINAL ULTRASOUND

[Series 1: us abdomen complete · 0.32mm/px · 14 of 90 slices shown]
[im 1/90]
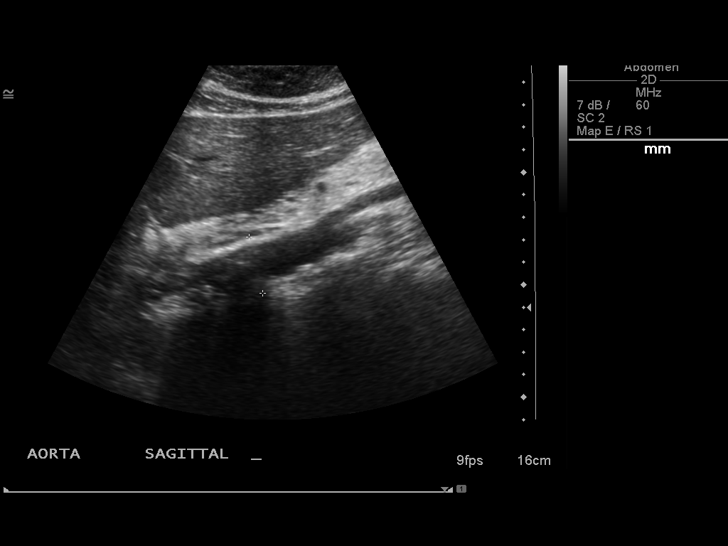
[im 8/90]
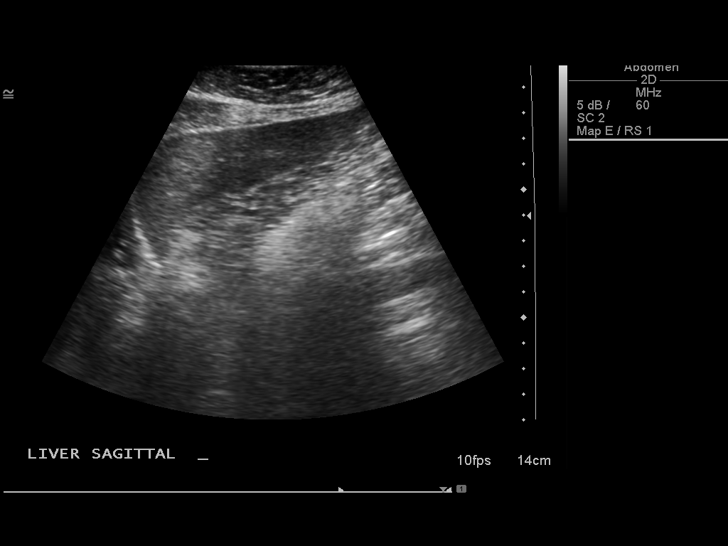
[im 15/90]
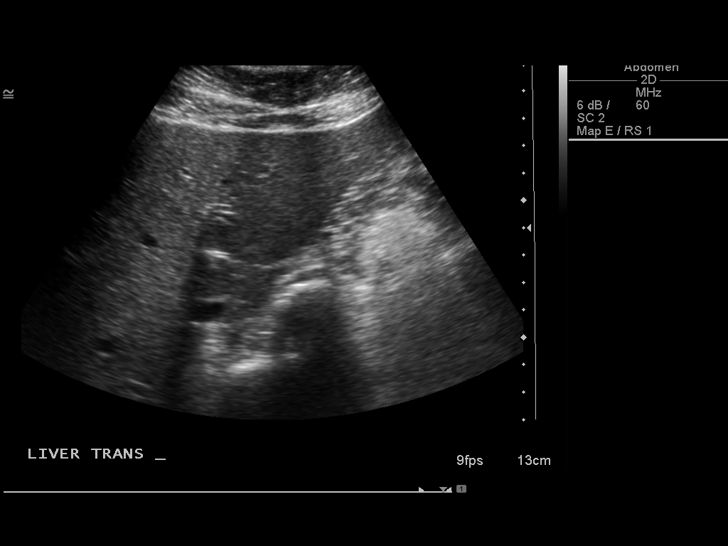
[im 23/90]
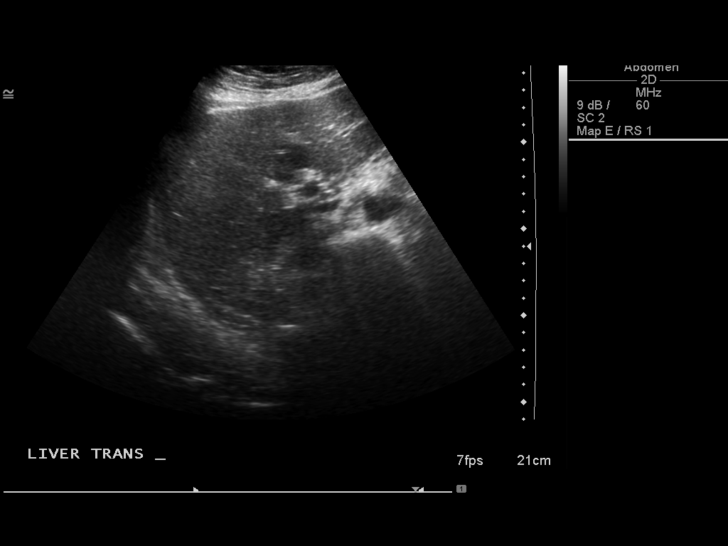
[im 30/90]
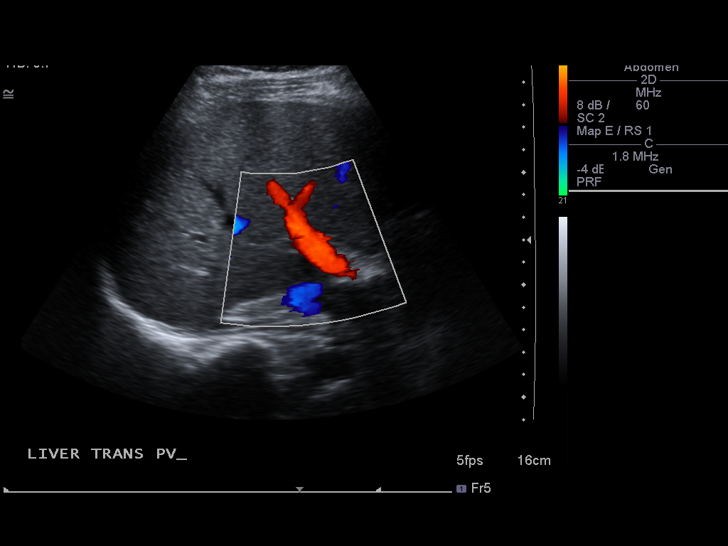
[im 34/90]
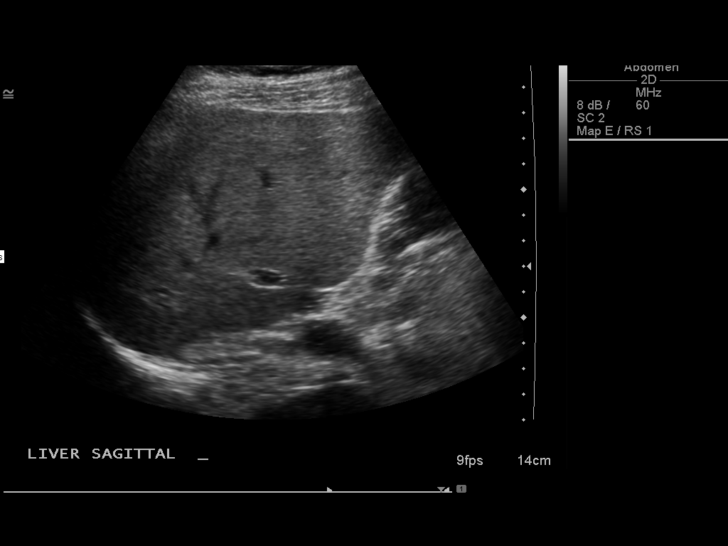
[im 41/90]
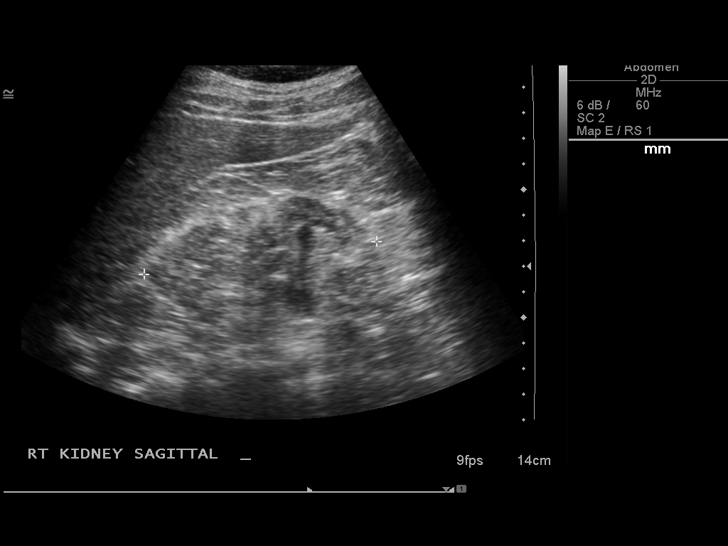
[im 49/90]
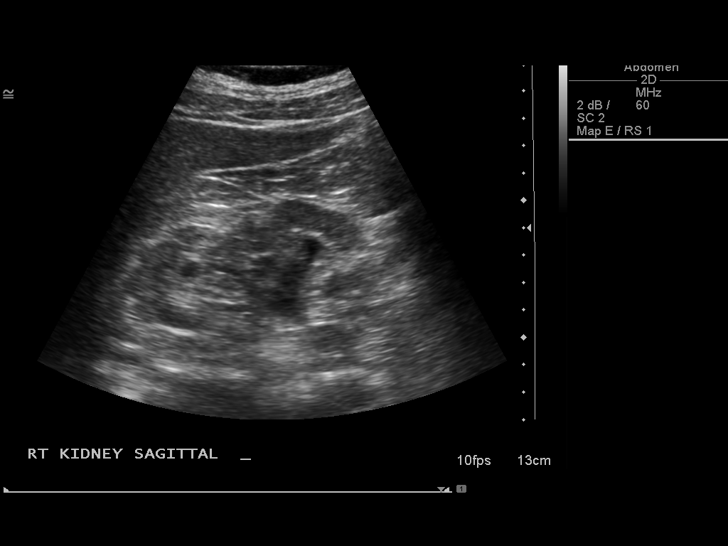
[im 56/90]
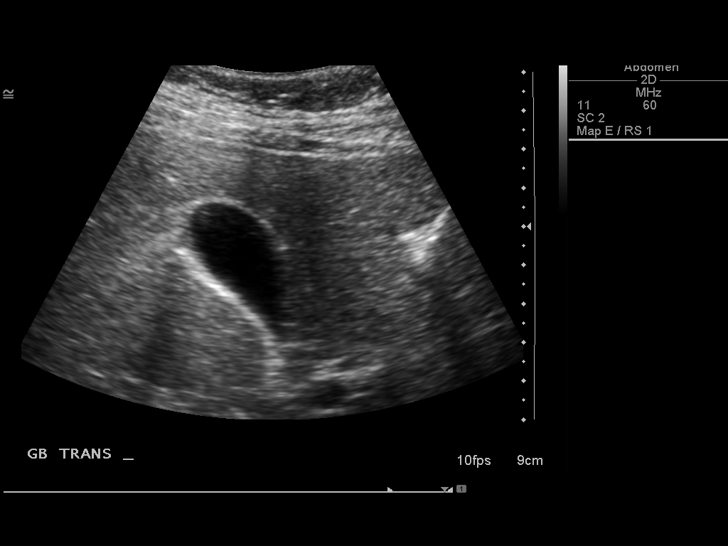
[im 60/90]
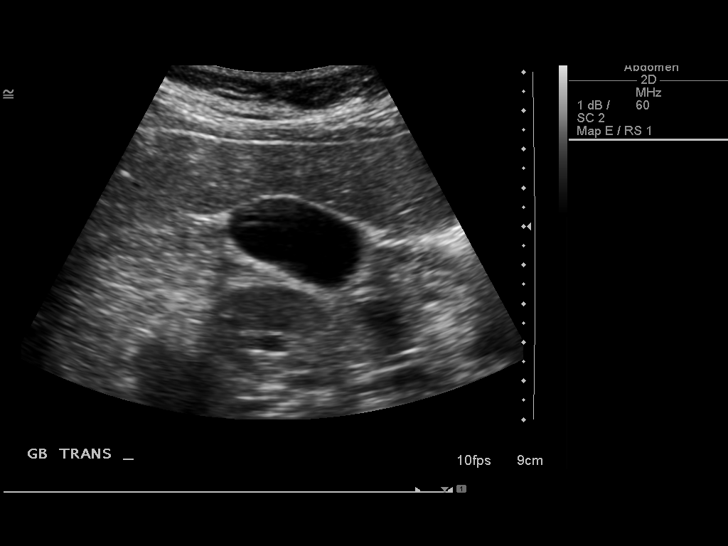
[im 67/90]
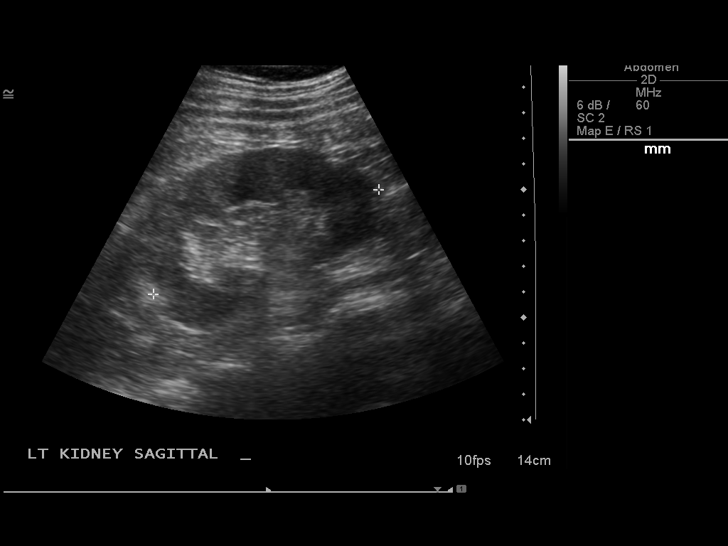
[im 75/90]
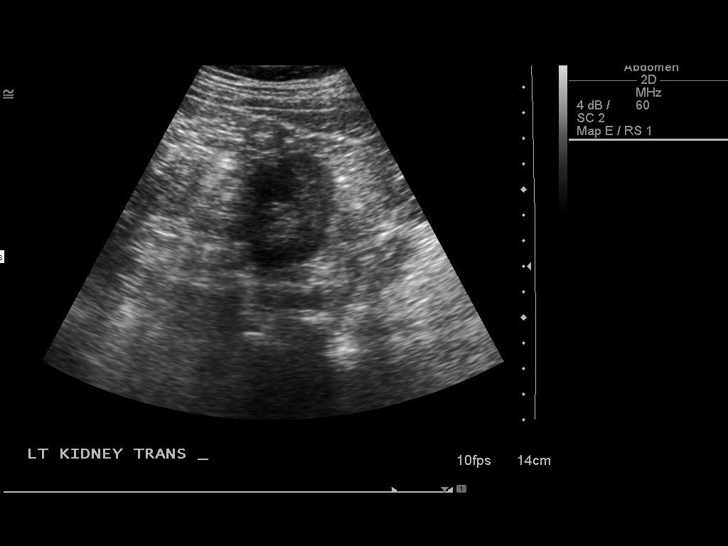
[im 82/90]
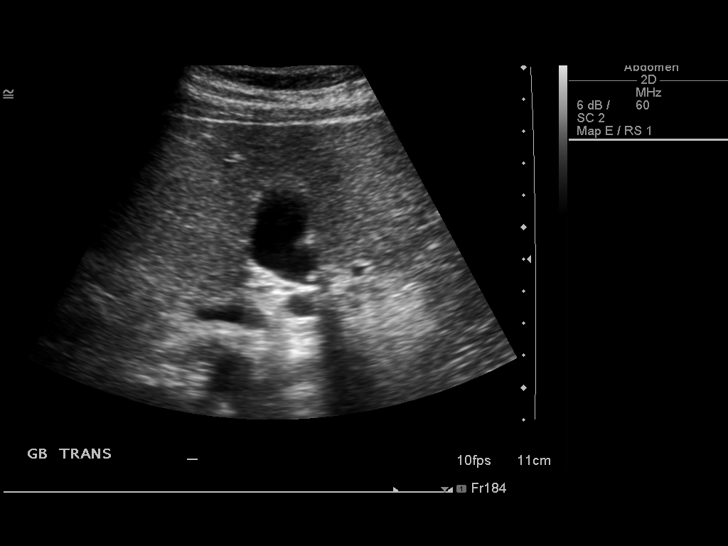
[im 90/90]
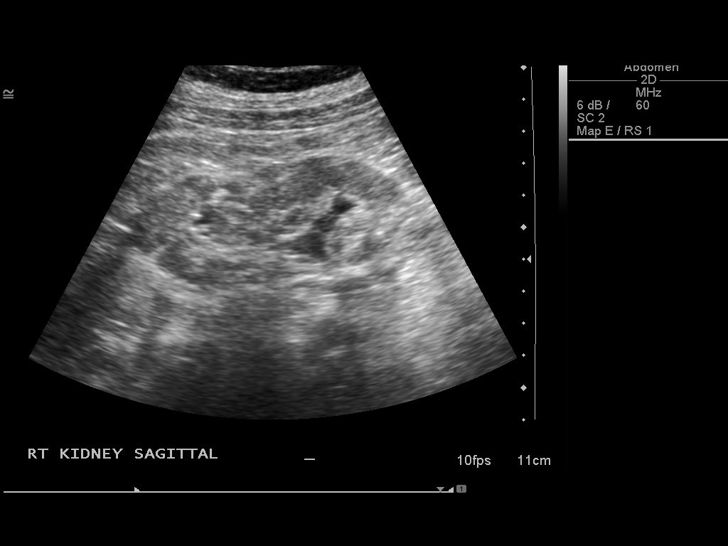

[14 of 25 positions shown; findings below may reference images not displayed]

FINDINGS: Gallbladder:  8 mm stone.  No wall thickening or pericholecystic
fluid. Sonographic Murphy's sign was not elicited.

Common bile duct: Normal, at 1 mm.

Liver: Normal in echogenicity, without focal lesion.

IVC: Negative

Pancreas:  Negative

Spleen:  Normal in size and echogenicity.

Right Kidney:  Mild right-sided pelvicaliectasis.  Similar to on
the 04/28/2009 exam.  Mild renal cortical thinning with increased
echogenicity.

Left Kidney:  9.7 cm. No hydronephrosis.

Abdominal aorta:  Nonaneurysmal without ascites.
IMPRESSION: 1.  Cholelithiasis without cholecystitis.
2.  No biliary ductal dilatation.
3.  Chronic right-sided pelvicaliectasis.  Concurrent cortical
thinning and increased echogenicity, suggesting medical renal
disease.

## 2013-08-10 ENCOUNTER — Ambulatory Visit
Admission: RE | Admit: 2013-08-10 | Discharge: 2013-08-10 | Disposition: A | Discharge status: Home / Self Care | Payer: Medicare Other | Source: Ambulatory Visit | Attending: Family Medicine | Admitting: Family Medicine

## 2013-08-10 ENCOUNTER — Ambulatory Visit
Admission: RE | Admit: 2013-08-10 | Discharge: 2013-08-10 | Disposition: A | Discharge status: Home / Self Care | Payer: PRIVATE HEALTH INSURANCE | Source: Ambulatory Visit | Attending: Family Medicine | Admitting: Family Medicine

## 2013-08-10 ENCOUNTER — Other Ambulatory Visit: Payer: Self-pay | Admitting: Family Medicine

## 2013-08-10 DIAGNOSIS — M7730 Calcaneal spur, unspecified foot: Secondary | ICD-10-CM

## 2013-10-29 IMAGING — CR DG CHEST 1V PORT
1 series · 1 of 1 positions shown · non-contrast
Comparison: 10/18/2010

CLINICAL DATA: Chest pain and cough

PORTABLE CHEST - 1 VIEW

[AP]
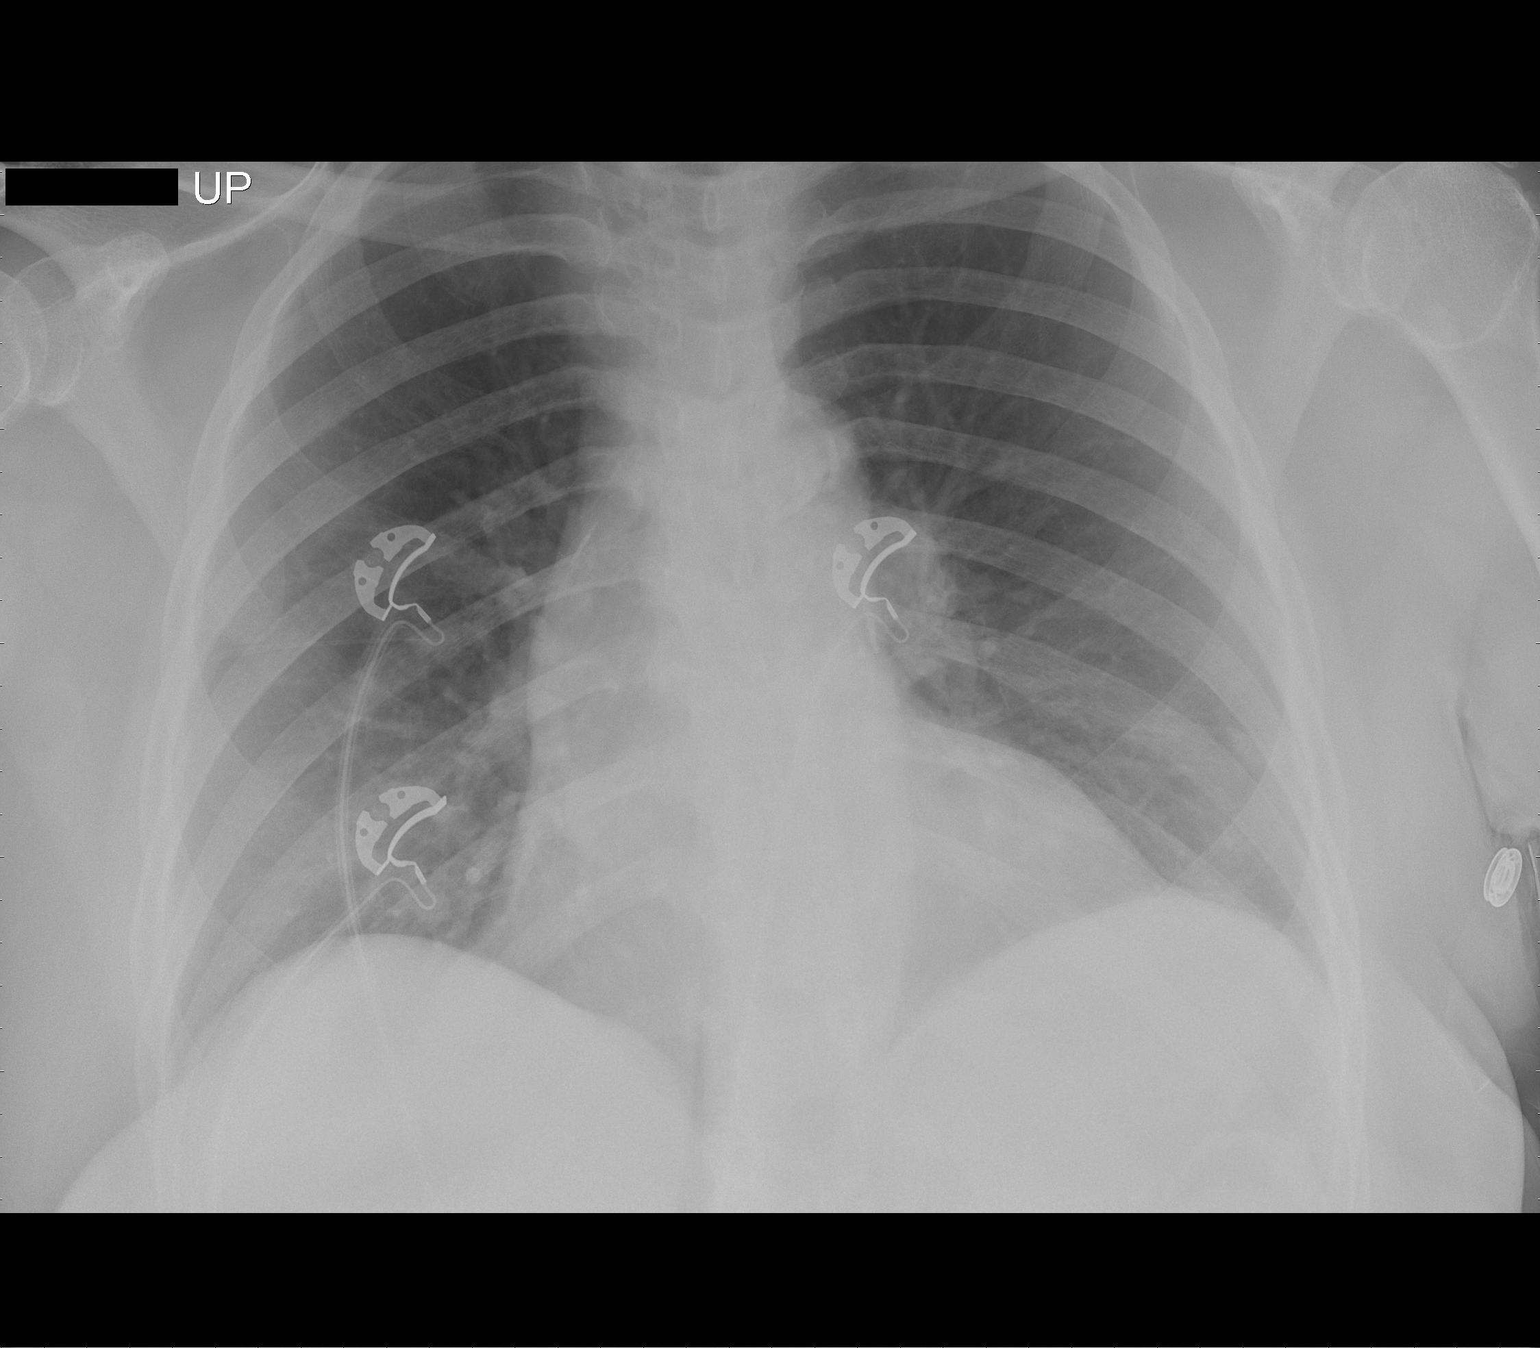

[1 of 1 positions shown; findings below may reference images not displayed]

FINDINGS: Mild cardiomegaly.  Clear lungs.  No pneumothorax.  No
pleural effusion.  No sign of interstitial edema.
IMPRESSION: Cardiomegaly without decompensation.

## 2013-11-13 ENCOUNTER — Encounter (HOSPITAL_COMMUNITY): Payer: Self-pay | Admitting: Emergency Medicine

## 2013-11-13 ENCOUNTER — Emergency Department (HOSPITAL_COMMUNITY): Payer: Medicare Other

## 2013-11-13 ENCOUNTER — Emergency Department (HOSPITAL_COMMUNITY)
Admission: EM | Admit: 2013-11-13 | Discharge: 2013-11-13 | Disposition: A | Discharge status: Home / Self Care | Payer: Medicare Other | Attending: Emergency Medicine | Admitting: Emergency Medicine

## 2013-11-13 DIAGNOSIS — E119 Type 2 diabetes mellitus without complications: Secondary | ICD-10-CM | POA: Diagnosis not present

## 2013-11-13 DIAGNOSIS — K219 Gastro-esophageal reflux disease without esophagitis: Secondary | ICD-10-CM | POA: Diagnosis not present

## 2013-11-13 DIAGNOSIS — Z794 Long term (current) use of insulin: Secondary | ICD-10-CM | POA: Insufficient documentation

## 2013-11-13 DIAGNOSIS — Z87891 Personal history of nicotine dependence: Secondary | ICD-10-CM | POA: Diagnosis not present

## 2013-11-13 DIAGNOSIS — M19041 Primary osteoarthritis, right hand: Secondary | ICD-10-CM | POA: Diagnosis not present

## 2013-11-13 DIAGNOSIS — Z7952 Long term (current) use of systemic steroids: Secondary | ICD-10-CM | POA: Insufficient documentation

## 2013-11-13 DIAGNOSIS — I25119 Atherosclerotic heart disease of native coronary artery with unspecified angina pectoris: Secondary | ICD-10-CM | POA: Diagnosis not present

## 2013-11-13 DIAGNOSIS — Z8639 Personal history of other endocrine, nutritional and metabolic disease: Secondary | ICD-10-CM | POA: Insufficient documentation

## 2013-11-13 DIAGNOSIS — I252 Old myocardial infarction: Secondary | ICD-10-CM | POA: Insufficient documentation

## 2013-11-13 DIAGNOSIS — M19042 Primary osteoarthritis, left hand: Secondary | ICD-10-CM | POA: Insufficient documentation

## 2013-11-13 DIAGNOSIS — I10 Essential (primary) hypertension: Secondary | ICD-10-CM | POA: Insufficient documentation

## 2013-11-13 DIAGNOSIS — Z7982 Long term (current) use of aspirin: Secondary | ICD-10-CM | POA: Insufficient documentation

## 2013-11-13 DIAGNOSIS — Z7951 Long term (current) use of inhaled steroids: Secondary | ICD-10-CM | POA: Insufficient documentation

## 2013-11-13 DIAGNOSIS — Z86010 Personal history of colonic polyps: Secondary | ICD-10-CM | POA: Diagnosis not present

## 2013-11-13 DIAGNOSIS — Z79899 Other long term (current) drug therapy: Secondary | ICD-10-CM | POA: Insufficient documentation

## 2013-11-13 DIAGNOSIS — D649 Anemia, unspecified: Secondary | ICD-10-CM | POA: Diagnosis not present

## 2013-11-13 DIAGNOSIS — J441 Chronic obstructive pulmonary disease with (acute) exacerbation: Secondary | ICD-10-CM | POA: Diagnosis not present

## 2013-11-13 DIAGNOSIS — Z792 Long term (current) use of antibiotics: Secondary | ICD-10-CM | POA: Diagnosis not present

## 2013-11-13 DIAGNOSIS — R079 Chest pain, unspecified: Secondary | ICD-10-CM | POA: Diagnosis present

## 2013-11-13 DIAGNOSIS — R0789 Other chest pain: Secondary | ICD-10-CM

## 2013-11-13 LAB — CBC
HCT: 42.2 % (ref 36.0–46.0)
Hemoglobin: 13.1 g/dL (ref 12.0–15.0)
MCH: 29.4 pg (ref 26.0–34.0)
MCHC: 31 g/dL (ref 30.0–36.0)
MCV: 94.8 fL (ref 78.0–100.0)
Platelets: 280 10*3/uL (ref 150–400)
RBC: 4.45 MIL/uL (ref 3.87–5.11)
RDW: 16.8 % — AB (ref 11.5–15.5)
WBC: 6.9 10*3/uL (ref 4.0–10.5)

## 2013-11-13 LAB — BASIC METABOLIC PANEL
Anion gap: 11 (ref 5–15)
BUN: 12 mg/dL (ref 6–23)
CO2: 28 mEq/L (ref 19–32)
Calcium: 9.5 mg/dL (ref 8.4–10.5)
Chloride: 104 mEq/L (ref 96–112)
Creatinine, Ser: 1 mg/dL (ref 0.50–1.10)
GFR, EST AFRICAN AMERICAN: 67 mL/min — AB (ref >90)
GFR, EST NON AFRICAN AMERICAN: 58 mL/min — AB (ref >90)
Glucose, Bld: 106 mg/dL — ABNORMAL HIGH (ref 70–99)
POTASSIUM: 4.1 meq/L (ref 3.7–5.3)
Sodium: 143 mEq/L (ref 137–147)

## 2013-11-13 LAB — PRO B NATRIURETIC PEPTIDE: PRO B NATRI PEPTIDE: 35.1 pg/mL (ref 0–125)

## 2013-11-13 LAB — I-STAT TROPONIN, ED: TROPONIN I, POC: 0 ng/mL (ref 0.00–0.08)

## 2013-11-13 MED ORDER — ALBUTEROL SULFATE (2.5 MG/3ML) 0.083% IN NEBU
5.0000 mg | INHALATION_SOLUTION | Freq: Once | RESPIRATORY_TRACT | Status: AC
  Administered 2013-11-13: 5 mg via RESPIRATORY_TRACT
  Filled 2013-11-13: qty 6

## 2013-11-13 MED ORDER — IPRATROPIUM BROMIDE 0.02 % IN SOLN
0.5000 mg | Freq: Once | RESPIRATORY_TRACT | Status: AC
  Administered 2013-11-13: 0.5 mg via RESPIRATORY_TRACT
  Filled 2013-11-13: qty 2.5

## 2013-11-13 MED ORDER — PREDNISONE 20 MG PO TABS: ORAL_TABLET | ORAL | Status: DC

## 2013-11-13 MED ORDER — OXYCODONE-ACETAMINOPHEN 5-325 MG PO TABS
2.0000 | ORAL_TABLET | Freq: Once | ORAL | Status: AC
  Administered 2013-11-13: 2 via ORAL
  Filled 2013-11-13: qty 2

## 2013-11-13 MED ORDER — OXYCODONE-ACETAMINOPHEN 5-325 MG PO TABS: 1.0000 | ORAL_TABLET | ORAL | Status: DC | PRN

## 2013-11-13 MED ORDER — PREDNISONE 20 MG PO TABS
60.0000 mg | ORAL_TABLET | Freq: Once | ORAL | Status: AC
  Administered 2013-11-13: 60 mg via ORAL
  Filled 2013-11-13: qty 3

## 2013-11-13 NOTE — ED Notes (Signed)
Pt reports a pricking sensation radiating from her shoulder down her left arm. Pt denies pain but reports it being irritating.

## 2013-11-13 NOTE — ED Notes (Signed)
Pt denies allievation of chest pain post medication administration. Pt lungs sounds present with inspiratory wheezes. Pt began to desat into the 80% range. Placed pt on 3L nasal canula to result in 95% SPO2.

## 2013-11-13 NOTE — ED Notes (Addendum)
Pt reports waking up this am with mid sharp chest pains and sob. Audible wheezing noted at triage, hx of asthma. ekg done at triage and airway intact.

## 2013-11-13 NOTE — ED Provider Notes (Signed)
CSN: [id number]     Arrival date & time 11/13/13  0708 History   First MD Initiated Contact with Patient 11/13/13 [phone]     Chief Complaint  Patient presents with  . Chest Pain     (Consider location/radiation/quality/duration/timing/severity/associated sxs/prior Treatment) HPI 63 year old female with history of COPD, coronary artery disease with prior stenting, history of anxiety, history of chronic intermittent chest pain, presents with 2 weeks of constant 24 hours a day sharp stabbing chest pain worse with position changes palpation but not exertion, she is no fever but does have slight cough in his wheezing with shortness of breath with all her symptoms worsening over the last 2 weeks and worse today. She has no change in mental status no vomiting no fever and no other concerns. Past Medical History  Diagnosis Date  . History of colonic polyps   . GERD (gastroesophageal reflux disease)   . Hyperlipidemia   . Diabetes mellitus without mention of complication   . Hypertension   . Myocardial infarction   . Coronary artery disease   . Fatty liver disease, nonalcoholic   . Cholelithiasis   . Iron deficiency anemia   . Angina   . Shortness of breath   . Asthma   . Arthritis     HANDS"  . Cocaine abuse 07/2012    per [facility] drug screen   Past Surgical History  Procedure Laterality Date  . Right colectomy      After colonic perforation  . Tubal ligation    . Coronary angioplasty with stent placement    . Esophagogastroduodenoscopy  11/30/2011    Procedure: ESOPHAGOGASTRODUODENOSCOPY (EGD);  Surgeon: Lafayette Dragon, MD;  Location: [facility];  Service: Endoscopy;  Laterality: N/A;  . Colonoscopy  11/30/2011    Procedure: COLONOSCOPY;  Surgeon: Lafayette Dragon, MD;  Location: [facility];  Service: Endoscopy;  Laterality: N/A;   Family History  Problem Relation Age of Onset  . Heart disease Mother   . Diabetes Mother   . Colon cancer Neg Hx   . Cancer Father     unsure what  kind  . Diabetes Sister    History  Substance Use Topics  . Smoking status: Former Smoker -- 0.50 packs/day for 47 years    Types: Cigarettes    Quit date: 05/09/2010  . Smokeless tobacco: Never Used  . Alcohol Use: No   OB History    No data available     Review of Systems  10 Systems reviewed and are negative for acute change except as noted in the HPI.  Allergies  Review of patient's allergies indicates no known allergies.  Home Medications   Prior to Admission medications   Medication Sig Start Date End Date Taking? Authorizing Provider  acetaminophen (TYLENOL) 500 MG tablet Take 500 mg by mouth every 6 (six) hours as needed for mild pain.   Yes Historical Provider, MD  ALPRAZolam (XANAX) 0.25 MG tablet Take 0.25 mg by mouth 2 (two) times daily.    Yes Historical Provider, MD  amLODipine (NORVASC) 5 MG tablet Take 5 mg by mouth daily.   Yes Historical Provider, MD  aspirin EC 81 MG EC tablet Take 1 tablet (81 mg total) by mouth daily. 02/06/12  Yes Waylan Boga, NP  atorvastatin (LIPITOR) 20 MG tablet Take 20 mg by mouth daily.   Yes Historical Provider, MD  Besifloxacin HCl (BESIVANCE) 0.6 % SUSP Apply 1-2 drops to eye 2 (two) times daily as needed.   Yes Historical  Provider, MD  budesonide-formoterol (SYMBICORT) 160-4.5 MCG/ACT inhaler Inhale 2 puffs into the lungs 2 (two) times daily.   Yes Historical Provider, MD  diphenoxylate-atropine (LOMOTIL) 2.5-0.025 MG per tablet Take 1-2 tablets by mouth 3 (three) times daily as needed for diarrhea or loose stools.   Yes Historical Provider, MD  HYDROcodone-acetaminophen (NORCO) 10-325 MG per tablet Take 1 tablet by mouth every 4 (four) hours as needed for moderate pain.   Yes Historical Provider, MD  Iron-FA-B Cmp-C-Biot-Probiotic (FUSION PLUS PO) Take 1 capsule by mouth daily.   Yes Historical Provider, MD  ketorolac (ACULAR) 0.4 % SOLN Place 1 drop into both eyes 4 (four) times daily.   Yes Historical Provider, MD  metFORMIN  (GLUCOPHAGE) 1000 MG tablet Take 1,000 mg by mouth 2 (two) times daily with a meal.   Yes Historical Provider, MD  metoprolol succinate (TOPROL-XL) 50 MG 24 hr tablet Take 50 mg by mouth every evening. Take with or immediately following a meal.   Yes Historical Provider, MD  moxifloxacin (VIGAMOX) 0.5 % ophthalmic solution Place 1 drop into both eyes 3 (three) times daily.   Yes Historical Provider, MD  nitroGLYCERIN (NITROSTAT) 0.4 MG SL tablet Place 1 tablet (0.4 mg total) under the tongue every 5 (five) minutes as needed. For chest pain 02/06/12  Yes Waylan Boga, NP  pantoprazole (PROTONIX) 40 MG tablet Take 40 mg by mouth daily.   Yes Historical Provider, MD  prednisoLONE acetate (PRED FORTE) 1 % ophthalmic suspension Place 1 drop into the left eye 4 (four) times daily.   Yes Historical Provider, MD  pregabalin (LYRICA) 150 MG capsule Take 150 mg by mouth 2 (two) times daily.    Yes Historical Provider, MD  Insulin Glargine (LANTUS SOLOSTAR) 100 UNIT/ML SOPN Inject 40 Units into the skin 2 (two) times daily.    Historical Provider, MD  oxyCODONE-acetaminophen (PERCOCET) 5-325 MG per tablet Take 1 tablet by mouth every 4 (four) hours as needed for severe pain. 11/13/13   Babette Relic, MD  predniSONE (DELTASONE) 20 MG tablet 2 tabs po daily x 4 days 11/13/13   Babette Relic, MD   BP 115/62 mmHg  Pulse 73  Temp(Src) 97.8 F (36.6 C) (Oral)  Resp 24  SpO2 98% Physical Exam  Nursing note and vitals reviewed. Constitutional:  Awake, alert, nontoxic appearance.  HENT:  Head: Atraumatic.  Eyes: Right eye exhibits no discharge. Left eye exhibits no discharge.  Neck: Neck supple.  Cardiovascular: Normal rate and regular rhythm.   No murmur heard. Pulmonary/Chest: She is in respiratory distress. She has wheezes. She has no rales. She exhibits tenderness.  Mild diffuse expiratory wheezes with pulse oximetry normal on room air 97% with no retractions no accessory loss easy patient is able to speak  short sentences with mild respiratory distress; reproducible chest wall tenderness anteriorly  Abdominal: Soft. Bowel sounds are normal. She exhibits no distension. There is no tenderness. There is no rebound and no guarding.  Musculoskeletal: She exhibits no tenderness.  Baseline ROM, no obvious new focal weakness.  Neurological: She is alert.  Mental status and motor strength appears baseline for patient and situation.  Skin: No rash noted.  Psychiatric: She has a normal mood and affect.    ED Course  Procedures (including critical care time) Labs Review Labs Reviewed  CBC - Abnormal; Notable for the following:    RDW 16.8 (*)    All other components within normal limits  BASIC METABOLIC PANEL - Abnormal; Notable for  the following:    Glucose, Bld 106 (*)    GFR calc non Af Amer 58 (*)    GFR calc Af Amer 67 (*)    All other components within normal limits  PRO B NATRIURETIC PEPTIDE  I-STAT TROPOININ, ED    Imaging Review No results found.   EKG Interpretation   Date/Time:  Friday November 13 2013 07:15:55 EDT Ventricular Rate:  80 PR Interval:  158 QRS Duration: 80 QT Interval:  380 QTC Calculation: 438 R Axis:   -47 Text Interpretation:  Sinus rhythm with occasional Premature ventricular  complexes Left axis deviation No significant change since last tracing  Confirmed by [facility]  MD, Jenny Reichmann ([id number]) on 11/13/2013 7:31:07 AM      MDM   Final diagnoses:  COPD with acute exacerbation  Chest wall pain    Patient informed of clinical course, understand medical decision-making process, and agree with plan.  I doubt any other EMC precluding discharge at this time including, but not necessarily limited to the following:[facility].  Babette Relic, MD 12/06/13 [phone]

## 2013-11-13 NOTE — Discharge Instructions (Signed)
You have been diagnosed by your caregiver as having chest wall pain. SEEK IMMEDIATE MEDICAL ATTENTION IF: You develop a fever.  Your chest pains become severe or intolerable.  You develop new, unexplained symptoms (problems).  You develop shortness of breath, nausea, vomiting, sweating or feel light headed.  You develop a new cough or you cough up blood.   RETURN IMMEDIATELY IF you develop worse shortness of breath, confusion or altered mental status, a new rash, become dizzy, faint, or poorly responsive, or are unable to be cared for at home.

## 2013-11-23 ENCOUNTER — Other Ambulatory Visit: Payer: Self-pay | Admitting: Family Medicine

## 2013-11-23 DIAGNOSIS — R921 Mammographic calcification found on diagnostic imaging of breast: Secondary | ICD-10-CM

## 2013-11-27 IMAGING — CR DG CHEST 1V PORT
1 series · 1 of 1 positions shown · non-contrast
Comparison: Chest x-ray 02/18/2011.

CLINICAL DATA: Chest tightness, weakness and shortness of breath.

PORTABLE CHEST - 1 VIEW

[AP]
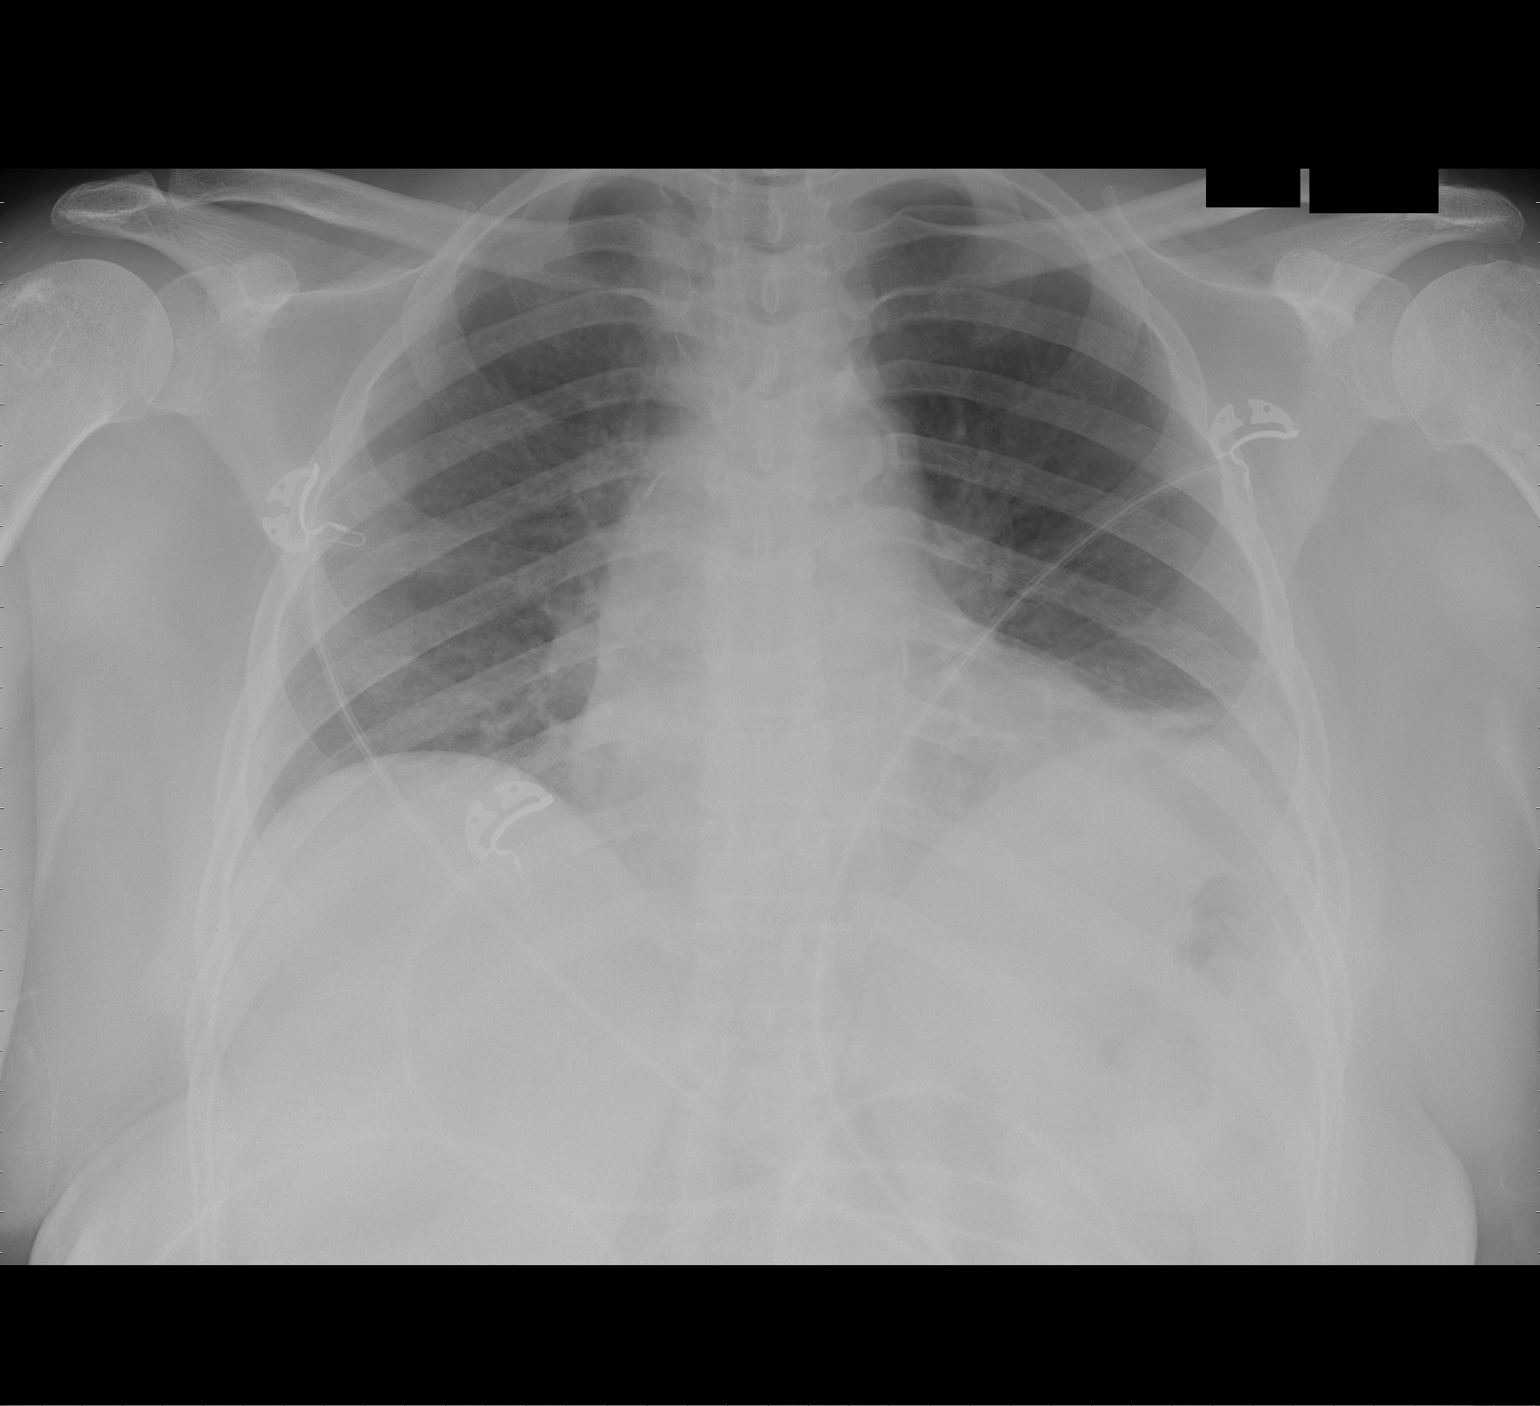

[1 of 1 positions shown; findings below may reference images not displayed]

FINDINGS: Lung volumes are low.  Linear opacity at the base of the
left hemithorax likely reflects subsegmental atelectasis. There is
also blunting of the left costophrenic sulcus, which may suggest a
small left-sided pleural effusion (however, this is favored to be
projectional). No definite consolidative airspace disease.  No
edema.  Heart size is upper limits of normal.  Mediastinal contours
are unremarkable.  Atherosclerotic calcifications in the thoracic
aorta.
IMPRESSION: 1. Low lung volumes with probable subsegmental atelectasis in the
left lower lobe.  A trace left-sided pleural effusion is difficult
to exclude (but is not favored).
2.  Atherosclerosis.

## 2013-11-30 IMAGING — RF DG ESOPHAGUS
14 of 24 series · 14 of 24 positions shown · non-contrast
Comparison: Plain film 03/19/2011.

CLINICAL DATA: Dysphagia.  Patient admitted with shortness of
breath.  Gastroesophageal reflux disease.  Diabetes.  Coronary
artery disease.  Anemia.  Asthma.

ESOPHOGRAM/BARIUM SWALLOW
TECHNIQUE: Single contrast examination was performed using thin
barium.
Fluoroscopy time:  2.4 minutes.

[Series 1: run · 1 of 1 slices shown (1 of 14)]
[im 1/1]
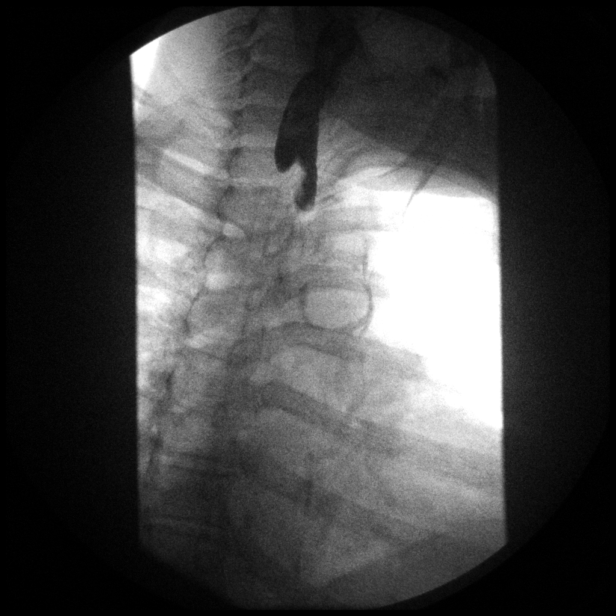

[Series 3: run · 1 of 1 slices shown (2 of 14)]
[im 1/1]
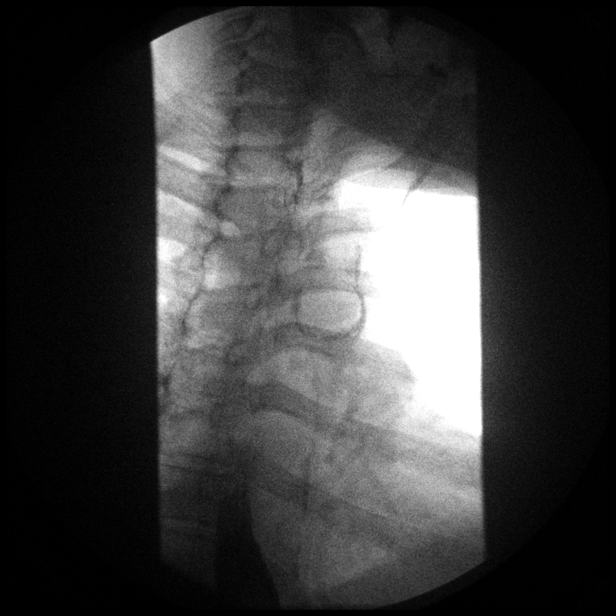

[Series 5: run · 1 of 1 slices shown (3 of 14)]
[im 1/1]
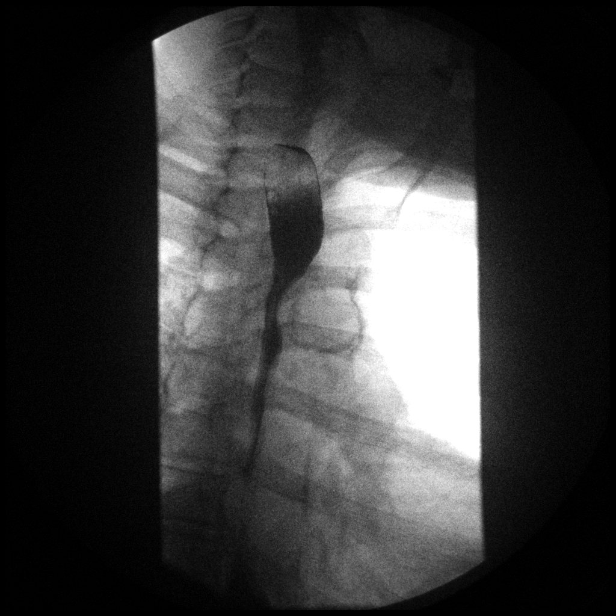

[Series 7: run · 1 of 1 slices shown (4 of 14)]
[im 1/1]
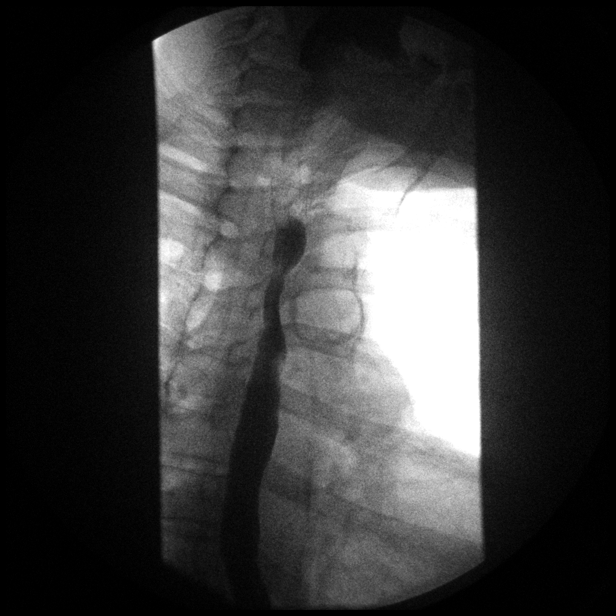

[Series 8: run · 1 of 1 slices shown (5 of 14)]
[im 1/1]
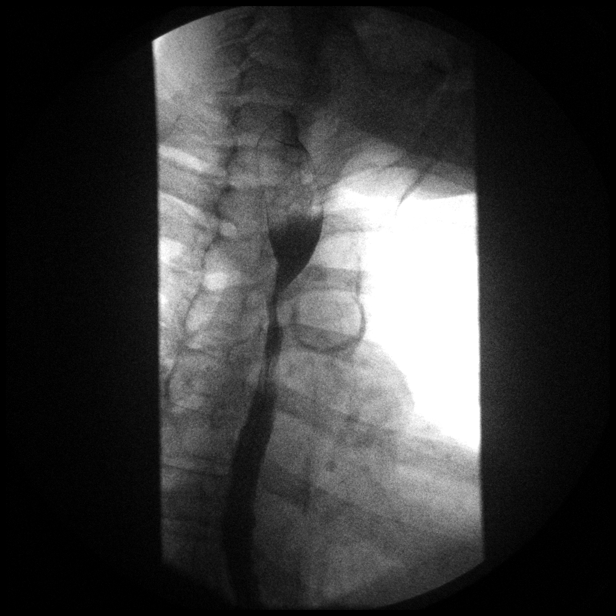

[Series 10: run · 1 of 1 slices shown (6 of 14)]
[im 1/1]
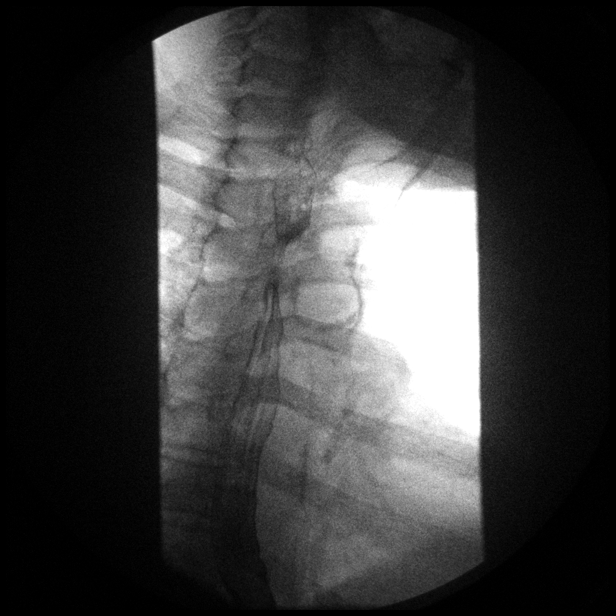

[Series 12: run · 1 of 1 slices shown (7 of 14)]
[im 1/1]
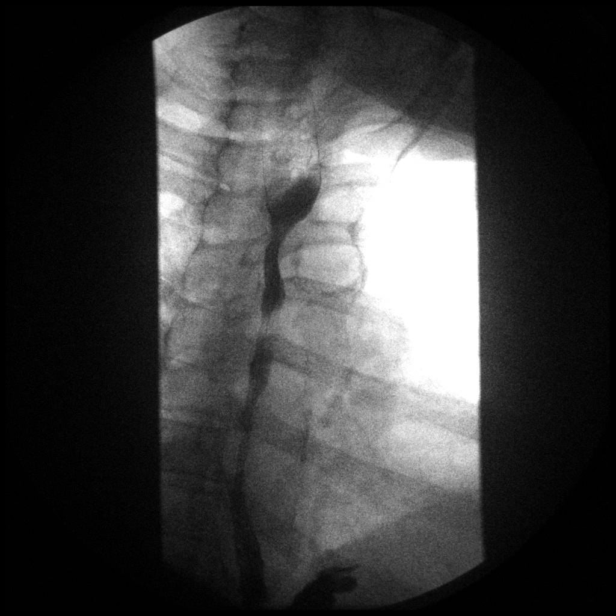

[Series 13: run · 1 of 1 slices shown (8 of 14)]
[im 1/1]
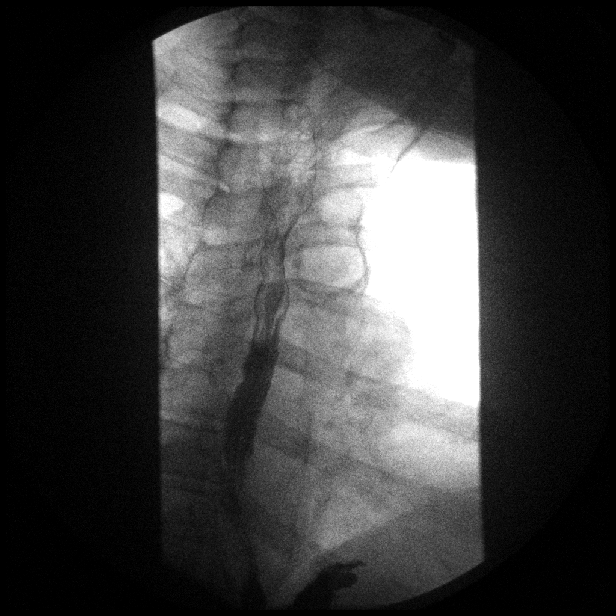

[Series 15: run · 1 of 1 slices shown (9 of 14)]
[im 1/1]
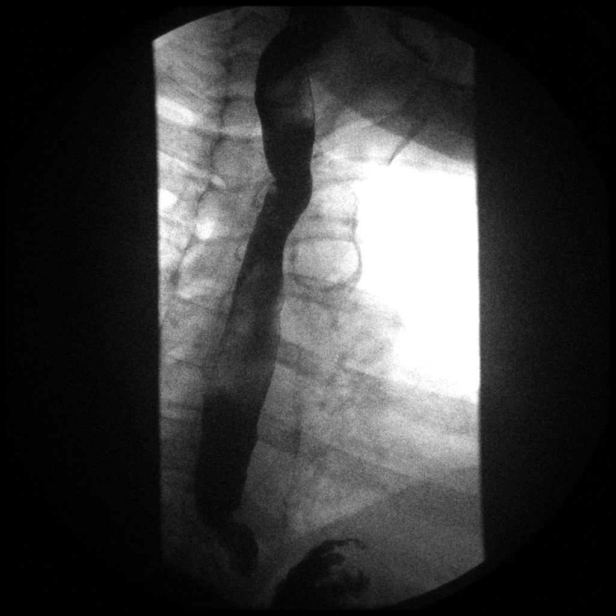

[Series 17: run · 1 of 1 slices shown (10 of 14)]
[im 1/1]
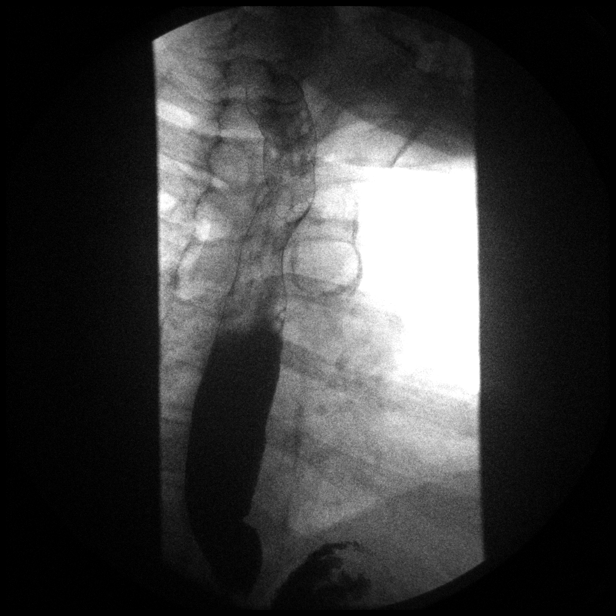

[Series 19: run · 1 of 1 slices shown (11 of 14)]
[im 1/1]
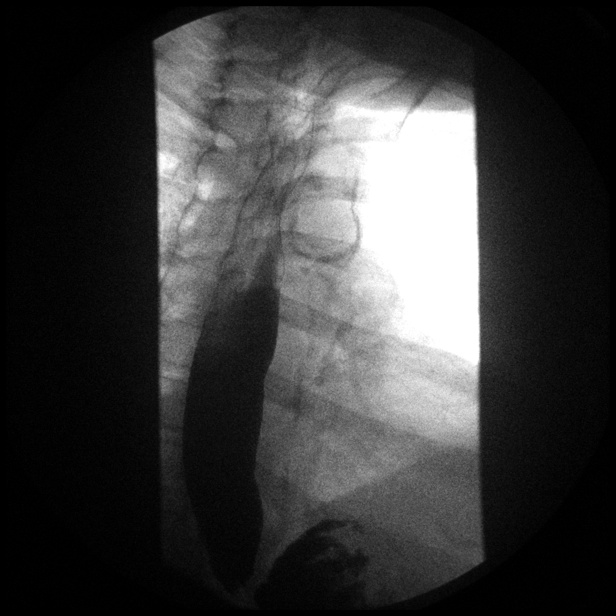

[Series 20: run · 1 of 1 slices shown (12 of 14)]
[im 1/1]
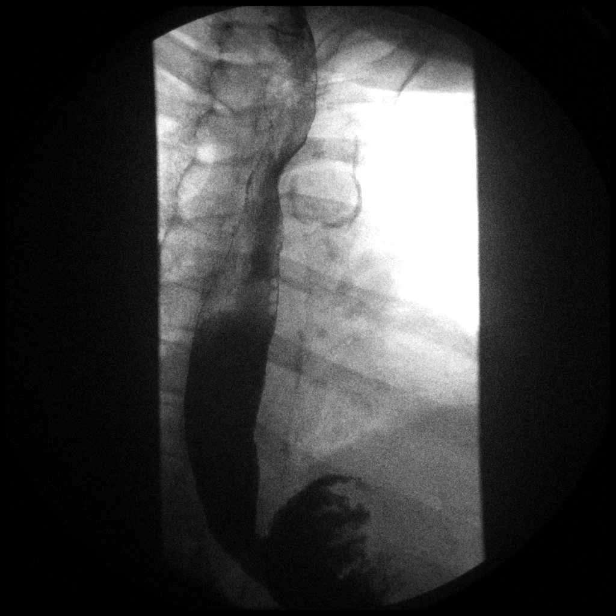

[Series 22: run · 1 of 1 slices shown (13 of 14)]
[im 1/1]
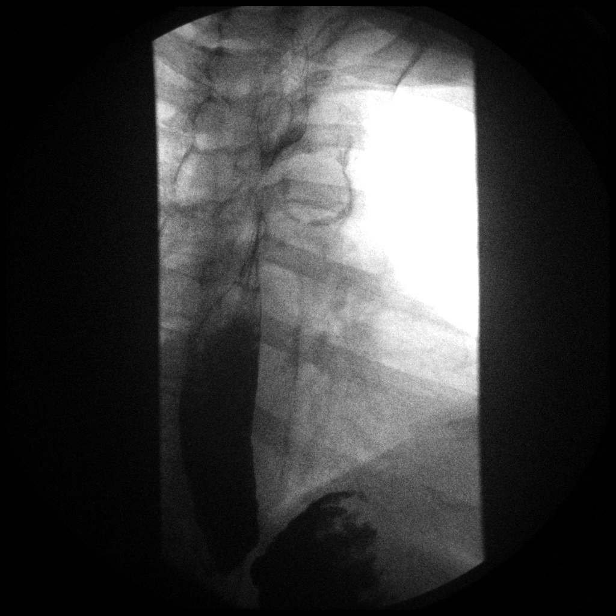

[Series 24: run · 1 of 1 slices shown (14 of 14)]
[im 1/1]
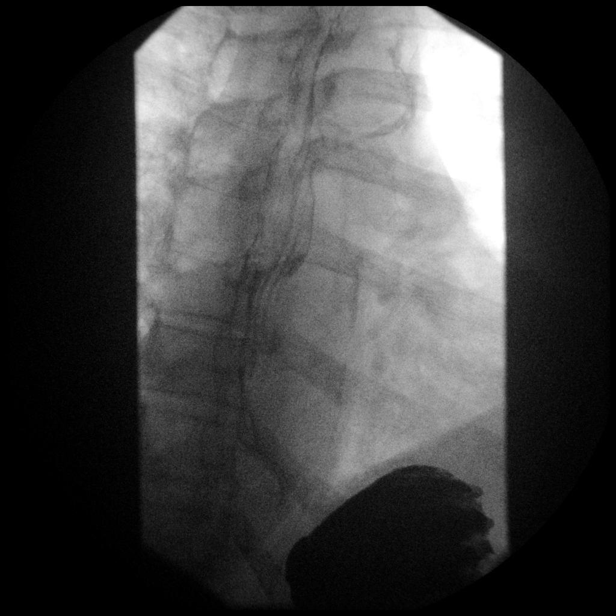

[14 of 24 positions shown; findings below may reference images not displayed]

FINDINGS: Exam was performed the patient in a LPO and RPO
positions. Esophageal motility could not be directly evaluated
secondary patient immobility.

There is no evidence of esophageal narrowing or stricture.  No
obstruction to contrast passage.  There is suggestion of
dysmotility, with contrast stasis on the non dedicated non prone
swallows.
IMPRESSION: 1.  Moderately degraded exam, secondary to clinical factors.
2.  No anatomic cause for patient's dysphagia.  No evidence of
esophageal stricture.
3.  Suspicion of esophageal dysmotility.

## 2013-12-01 IMAGING — CR DG ABD PORTABLE 1V
1 series · 1 of 1 positions shown · non-contrast
Comparison: Abdomen films of 04/26/2009.

CLINICAL DATA: Left sided abdominal pain

PORTABLE ABDOMEN - 1 VIEW

[AP]
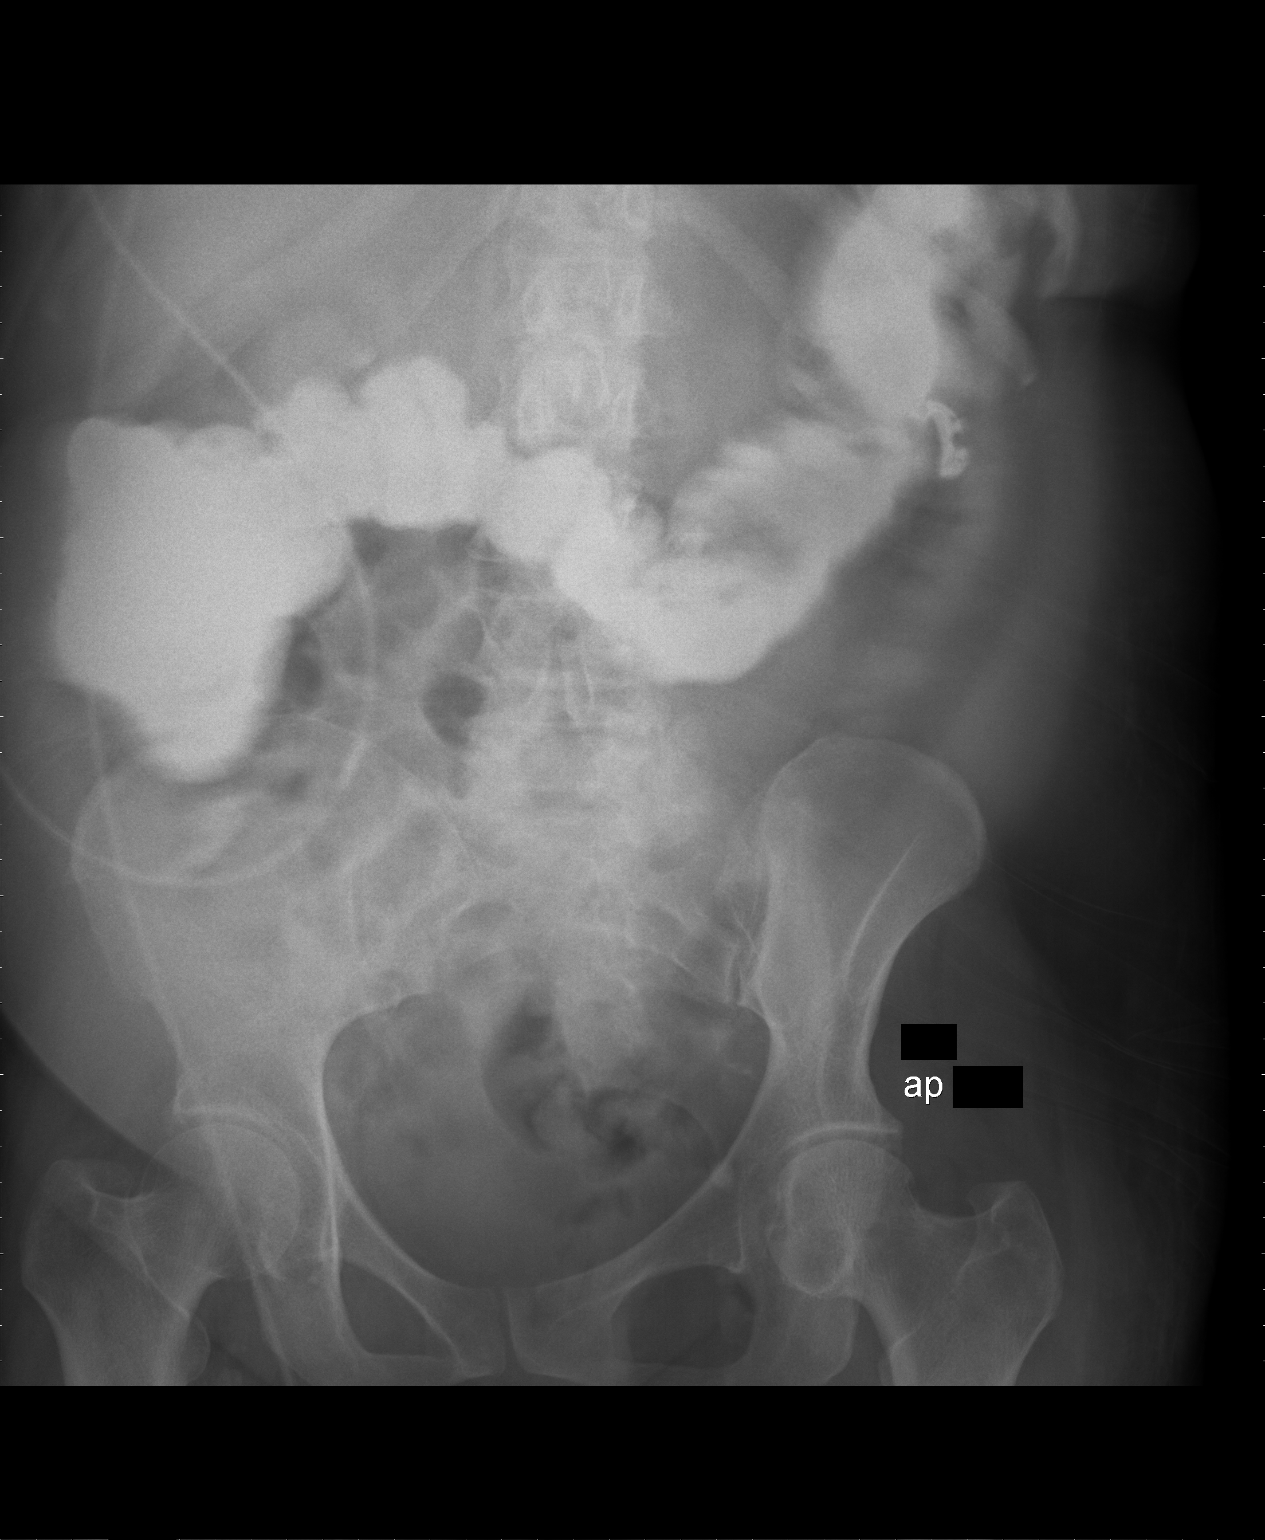

[1 of 1 positions shown; findings below may reference images not displayed]

FINDINGS: A portable film of the abdomen shows barium scattered
throughout the colon primarily in the transverse colon and right
colon with no evidence of obstruction.  No small bowel distention
is seen.
IMPRESSION: No bowel obstruction.  Barium scattered throughout the transverse
colon.

## 2013-12-02 ENCOUNTER — Other Ambulatory Visit: Payer: Self-pay | Admitting: Family Medicine

## 2013-12-02 ENCOUNTER — Ambulatory Visit
Admission: RE | Admit: 2013-12-02 | Discharge: 2013-12-02 | Disposition: A | Discharge status: Home / Self Care | Payer: Medicare Other | Source: Ambulatory Visit | Attending: Family Medicine | Admitting: Family Medicine

## 2013-12-02 DIAGNOSIS — R921 Mammographic calcification found on diagnostic imaging of breast: Secondary | ICD-10-CM

## 2013-12-11 ENCOUNTER — Ambulatory Visit
Admission: RE | Admit: 2013-12-11 | Discharge: 2013-12-11 | Disposition: A | Discharge status: Home / Self Care | Payer: Medicare Other | Source: Ambulatory Visit | Attending: Family Medicine | Admitting: Family Medicine

## 2013-12-11 DIAGNOSIS — R921 Mammographic calcification found on diagnostic imaging of breast: Secondary | ICD-10-CM

## 2014-01-14 ENCOUNTER — Encounter (HOSPITAL_COMMUNITY): Payer: Self-pay | Admitting: Cardiology

## 2014-01-14 IMAGING — CR DG CHEST 1V PORT
1 series · 1 of 1 positions shown · non-contrast
Comparison: 03/19/2011

CLINICAL DATA: Chest pain, shortness of breath.

PORTABLE CHEST - 1 VIEW

[AP]
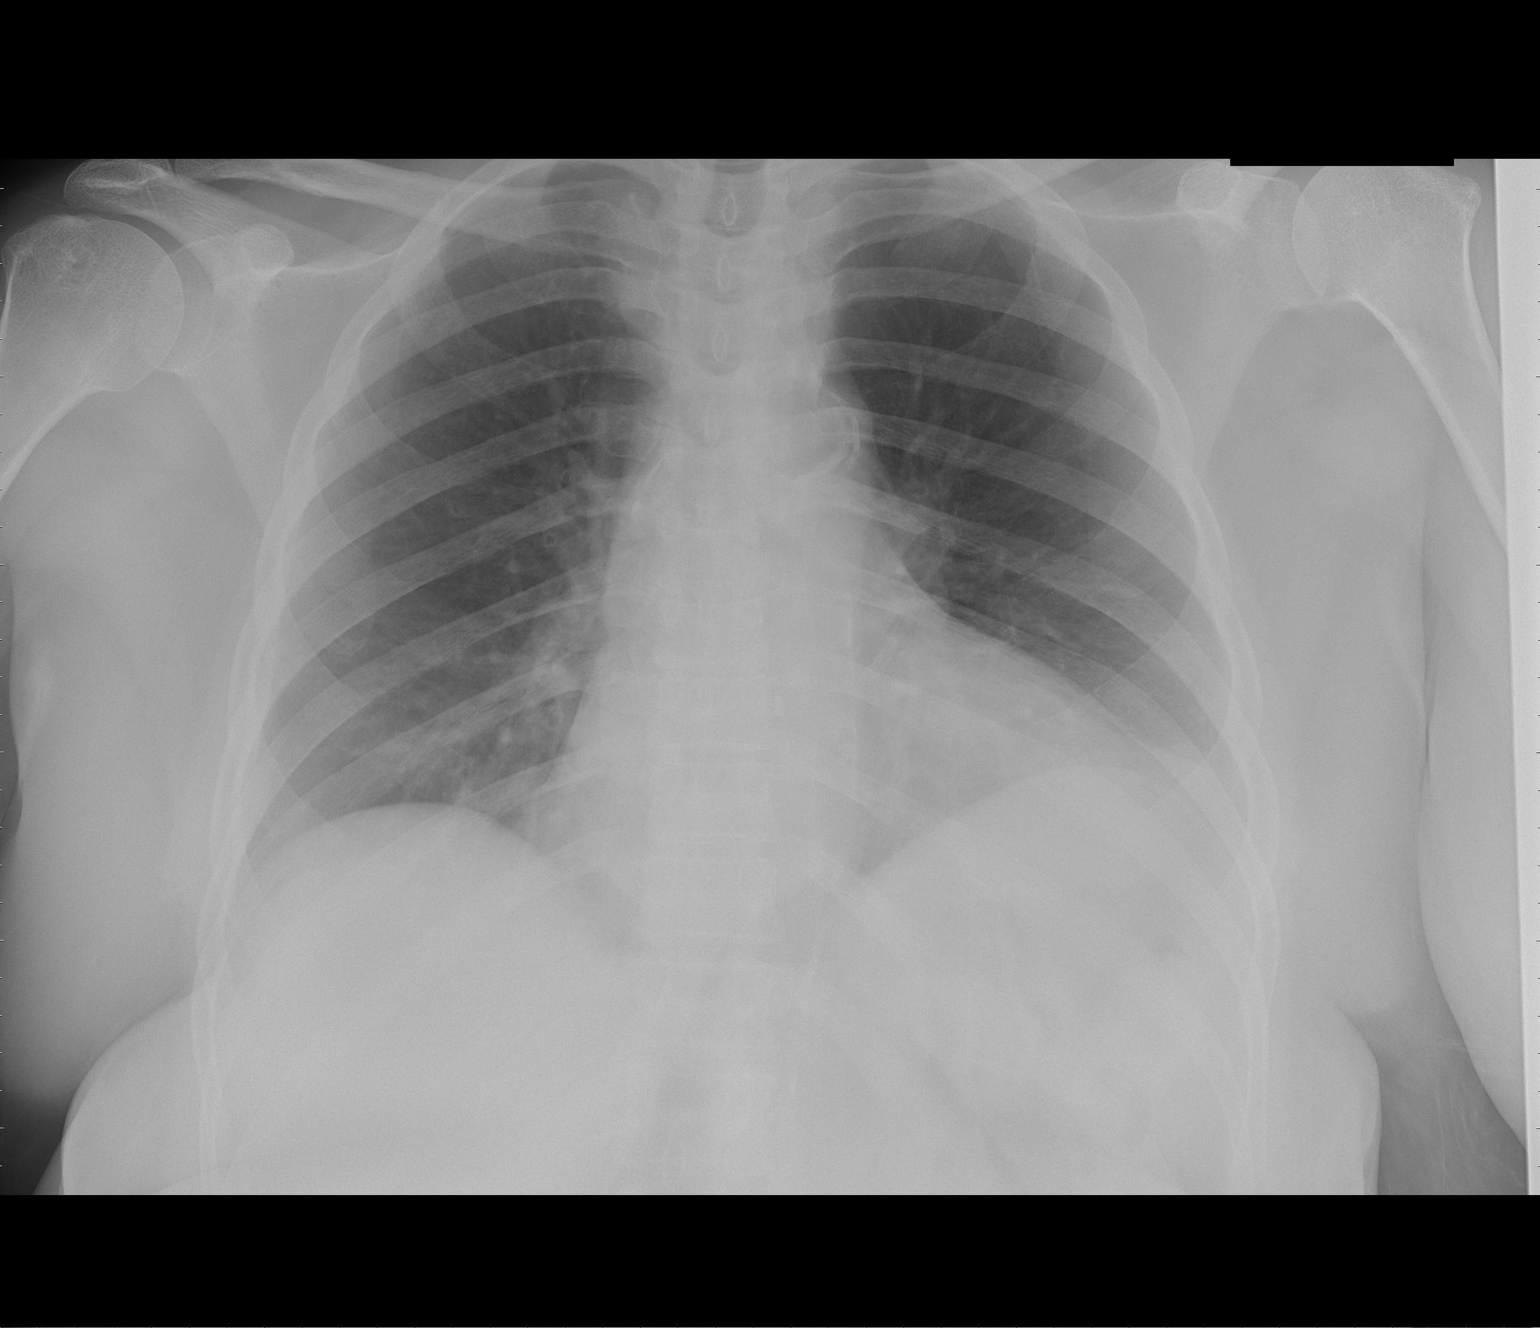

[1 of 1 positions shown; findings below may reference images not displayed]

FINDINGS: Atheromatous aortic arch.  Lungs clear.  Heart size upper
limits normal.  No effusion.  Regional bones unremarkable.
IMPRESSION: 1.  No acute disease.

## 2014-03-02 DIAGNOSIS — H20013 Primary iridocyclitis, bilateral: Secondary | ICD-10-CM | POA: Diagnosis not present

## 2014-03-23 DIAGNOSIS — H20013 Primary iridocyclitis, bilateral: Secondary | ICD-10-CM | POA: Diagnosis not present

## 2014-03-23 DIAGNOSIS — Z961 Presence of intraocular lens: Secondary | ICD-10-CM | POA: Diagnosis not present

## 2014-03-30 DIAGNOSIS — I2510 Atherosclerotic heart disease of native coronary artery without angina pectoris: Secondary | ICD-10-CM | POA: Diagnosis not present

## 2014-03-30 DIAGNOSIS — I209 Angina pectoris, unspecified: Secondary | ICD-10-CM | POA: Diagnosis not present

## 2014-03-30 DIAGNOSIS — E1140 Type 2 diabetes mellitus with diabetic neuropathy, unspecified: Secondary | ICD-10-CM | POA: Diagnosis not present

## 2014-03-30 DIAGNOSIS — I10 Essential (primary) hypertension: Secondary | ICD-10-CM | POA: Diagnosis not present

## 2014-03-30 DIAGNOSIS — E119 Type 2 diabetes mellitus without complications: Secondary | ICD-10-CM | POA: Diagnosis not present

## 2014-03-31 ENCOUNTER — Emergency Department (HOSPITAL_COMMUNITY): Payer: Medicare Other

## 2014-03-31 ENCOUNTER — Inpatient Hospital Stay (HOSPITAL_COMMUNITY)
Admission: EM | Admit: 2014-03-31 | Discharge: 2014-04-02 | DRG: 638 | Disposition: A | Discharge status: Home / Self Care | Payer: Medicare Other | Attending: Internal Medicine | Admitting: Internal Medicine

## 2014-03-31 ENCOUNTER — Encounter (HOSPITAL_COMMUNITY): Payer: Self-pay | Admitting: Emergency Medicine

## 2014-03-31 DIAGNOSIS — I10 Essential (primary) hypertension: Secondary | ICD-10-CM | POA: Diagnosis present

## 2014-03-31 DIAGNOSIS — M1990 Unspecified osteoarthritis, unspecified site: Secondary | ICD-10-CM | POA: Diagnosis not present

## 2014-03-31 DIAGNOSIS — J45909 Unspecified asthma, uncomplicated: Secondary | ICD-10-CM | POA: Diagnosis present

## 2014-03-31 DIAGNOSIS — Z87891 Personal history of nicotine dependence: Secondary | ICD-10-CM | POA: Diagnosis not present

## 2014-03-31 DIAGNOSIS — Z7982 Long term (current) use of aspirin: Secondary | ICD-10-CM | POA: Diagnosis not present

## 2014-03-31 DIAGNOSIS — N179 Acute kidney failure, unspecified: Secondary | ICD-10-CM | POA: Diagnosis not present

## 2014-03-31 DIAGNOSIS — E1165 Type 2 diabetes mellitus with hyperglycemia: Secondary | ICD-10-CM | POA: Diagnosis not present

## 2014-03-31 DIAGNOSIS — E0810 Diabetes mellitus due to underlying condition with ketoacidosis without coma: Secondary | ICD-10-CM | POA: Insufficient documentation

## 2014-03-31 DIAGNOSIS — R739 Hyperglycemia, unspecified: Secondary | ICD-10-CM

## 2014-03-31 DIAGNOSIS — J449 Chronic obstructive pulmonary disease, unspecified: Secondary | ICD-10-CM | POA: Diagnosis not present

## 2014-03-31 DIAGNOSIS — K8020 Calculus of gallbladder without cholecystitis without obstruction: Secondary | ICD-10-CM | POA: Diagnosis not present

## 2014-03-31 DIAGNOSIS — E1140 Type 2 diabetes mellitus with diabetic neuropathy, unspecified: Secondary | ICD-10-CM | POA: Diagnosis present

## 2014-03-31 DIAGNOSIS — E870 Hyperosmolality and hypernatremia: Secondary | ICD-10-CM | POA: Diagnosis present

## 2014-03-31 DIAGNOSIS — E8729 Other acidosis: Secondary | ICD-10-CM | POA: Insufficient documentation

## 2014-03-31 DIAGNOSIS — E875 Hyperkalemia: Secondary | ICD-10-CM | POA: Diagnosis not present

## 2014-03-31 DIAGNOSIS — R1013 Epigastric pain: Secondary | ICD-10-CM

## 2014-03-31 DIAGNOSIS — E785 Hyperlipidemia, unspecified: Secondary | ICD-10-CM | POA: Diagnosis not present

## 2014-03-31 DIAGNOSIS — E1101 Type 2 diabetes mellitus with hyperosmolarity with coma: Secondary | ICD-10-CM | POA: Diagnosis not present

## 2014-03-31 DIAGNOSIS — E1310 Other specified diabetes mellitus with ketoacidosis without coma: Secondary | ICD-10-CM | POA: Diagnosis not present

## 2014-03-31 DIAGNOSIS — E860 Dehydration: Secondary | ICD-10-CM | POA: Diagnosis not present

## 2014-03-31 DIAGNOSIS — Z794 Long term (current) use of insulin: Secondary | ICD-10-CM

## 2014-03-31 DIAGNOSIS — E1100 Type 2 diabetes mellitus with hyperosmolarity without nonketotic hyperglycemic-hyperosmolar coma (NKHHC): Secondary | ICD-10-CM | POA: Diagnosis not present

## 2014-03-31 DIAGNOSIS — E1122 Type 2 diabetes mellitus with diabetic chronic kidney disease: Secondary | ICD-10-CM | POA: Diagnosis not present

## 2014-03-31 DIAGNOSIS — I2510 Atherosclerotic heart disease of native coronary artery without angina pectoris: Secondary | ICD-10-CM | POA: Diagnosis present

## 2014-03-31 DIAGNOSIS — N183 Chronic kidney disease, stage 3 (moderate): Secondary | ICD-10-CM | POA: Diagnosis present

## 2014-03-31 DIAGNOSIS — F1410 Cocaine abuse, uncomplicated: Secondary | ICD-10-CM | POA: Insufficient documentation

## 2014-03-31 DIAGNOSIS — I129 Hypertensive chronic kidney disease with stage 1 through stage 4 chronic kidney disease, or unspecified chronic kidney disease: Secondary | ICD-10-CM | POA: Diagnosis not present

## 2014-03-31 DIAGNOSIS — R197 Diarrhea, unspecified: Secondary | ICD-10-CM | POA: Diagnosis present

## 2014-03-31 DIAGNOSIS — Z86010 Personal history of colonic polyps: Secondary | ICD-10-CM

## 2014-03-31 DIAGNOSIS — E872 Acidosis, unspecified: Secondary | ICD-10-CM

## 2014-03-31 DIAGNOSIS — R1110 Vomiting, unspecified: Secondary | ICD-10-CM | POA: Diagnosis not present

## 2014-03-31 DIAGNOSIS — Z9861 Coronary angioplasty status: Secondary | ICD-10-CM | POA: Diagnosis not present

## 2014-03-31 DIAGNOSIS — Z9114 Patient's other noncompliance with medication regimen: Secondary | ICD-10-CM | POA: Diagnosis present

## 2014-03-31 DIAGNOSIS — K219 Gastro-esophageal reflux disease without esophagitis: Secondary | ICD-10-CM | POA: Diagnosis not present

## 2014-03-31 DIAGNOSIS — I252 Old myocardial infarction: Secondary | ICD-10-CM

## 2014-03-31 DIAGNOSIS — R112 Nausea with vomiting, unspecified: Secondary | ICD-10-CM | POA: Diagnosis present

## 2014-03-31 LAB — I-STAT CHEM 8, ED
BUN: 30 mg/dL — ABNORMAL HIGH (ref 6–23)
CREATININE: 1.3 mg/dL — AB (ref 0.50–1.10)
Calcium, Ion: 1.31 mmol/L — ABNORMAL HIGH (ref 1.13–1.30)
Chloride: 99 mmol/L (ref 96–112)
HCT: 52 % — ABNORMAL HIGH (ref 36.0–46.0)
Hemoglobin: 17.7 g/dL — ABNORMAL HIGH (ref 12.0–15.0)
POTASSIUM: 6.1 mmol/L — AB (ref 3.5–5.1)
Sodium: 139 mmol/L (ref 135–145)
TCO2: 21 mmol/L (ref 0–100)

## 2014-03-31 LAB — URINALYSIS, ROUTINE W REFLEX MICROSCOPIC
Bilirubin Urine: NEGATIVE
Hgb urine dipstick: NEGATIVE
KETONES UR: 15 mg/dL — AB
Leukocytes, UA: NEGATIVE
Nitrite: NEGATIVE
PH: 5.5 (ref 5.0–8.0)
Protein, ur: NEGATIVE mg/dL
Specific Gravity, Urine: 1.034 — ABNORMAL HIGH (ref 1.005–1.030)
Urobilinogen, UA: 0.2 mg/dL (ref 0.0–1.0)

## 2014-03-31 LAB — COMPREHENSIVE METABOLIC PANEL
ALBUMIN: 4.7 g/dL (ref 3.5–5.2)
ALBUMIN: 4 g/dL (ref 3.5–5.2)
ALK PHOS: 168 U/L — AB (ref 39–117)
ALK PHOS: 163 U/L — AB (ref 39–117)
ALT: 21 U/L (ref 0–35)
ALT: 19 U/L (ref 0–35)
ANION GAP: 15 (ref 5–15)
AST: 20 U/L (ref 0–37)
AST: 16 U/L (ref 0–37)
Anion gap: 19 — ABNORMAL HIGH (ref 5–15)
BUN: 27 mg/dL — ABNORMAL HIGH (ref 6–23)
BUN: 25 mg/dL — ABNORMAL HIGH (ref 6–23)
CO2: 21 mmol/L (ref 19–32)
CO2: 25 mmol/L (ref 19–32)
Calcium: 10.2 mg/dL (ref 8.4–10.5)
Calcium: 9.8 mg/dL (ref 8.4–10.5)
Chloride: 89 mmol/L — ABNORMAL LOW (ref 96–112)
Chloride: 96 mmol/L (ref 96–112)
Creatinine, Ser: 1.46 mg/dL — ABNORMAL HIGH (ref 0.50–1.10)
Creatinine, Ser: 1.57 mg/dL — ABNORMAL HIGH (ref 0.50–1.10)
GFR calc Af Amer: 39 mL/min — ABNORMAL LOW (ref >90)
GFR calc non Af Amer: 36 mL/min — ABNORMAL LOW (ref >90)
GFR calc non Af Amer: 33 mL/min — ABNORMAL LOW (ref >90)
GFR, EST AFRICAN AMERICAN: 42 mL/min — AB (ref >90)
GLUCOSE: 1325 mg/dL — AB (ref 70–99)
Glucose, Bld: 998 mg/dL (ref 70–99)
POTASSIUM: 6 mmol/L — AB (ref 3.5–5.1)
Potassium: 4.2 mmol/L (ref 3.5–5.1)
SODIUM: 136 mmol/L (ref 135–145)
Sodium: 129 mmol/L — ABNORMAL LOW (ref 135–145)
TOTAL PROTEIN: 8.7 g/dL — AB (ref 6.0–8.3)
Total Bilirubin: 1.3 mg/dL — ABNORMAL HIGH (ref 0.3–1.2)
Total Bilirubin: 1.2 mg/dL (ref 0.3–1.2)
Total Protein: 9.7 g/dL — ABNORMAL HIGH (ref 6.0–8.3)

## 2014-03-31 LAB — URINE MICROSCOPIC-ADD ON

## 2014-03-31 LAB — CBC WITH DIFFERENTIAL/PLATELET
BASOS ABS: 0 10*3/uL (ref 0.0–0.1)
Basophils Relative: 0 % (ref 0–1)
Eosinophils Absolute: 0 10*3/uL (ref 0.0–0.7)
Eosinophils Relative: 0 % (ref 0–5)
HCT: 48.4 % — ABNORMAL HIGH (ref 36.0–46.0)
Hemoglobin: 16.6 g/dL — ABNORMAL HIGH (ref 12.0–15.0)
LYMPHS ABS: 0.7 10*3/uL (ref 0.7–4.0)
Lymphocytes Relative: 7 % — ABNORMAL LOW (ref 12–46)
MCH: 29.6 pg (ref 26.0–34.0)
MCHC: 34.7 g/dL (ref 30.0–36.0)
MCV: 85.2 fL (ref 78.0–100.0)
Monocytes Absolute: 0.2 10*3/uL (ref 0.1–1.0)
Monocytes Relative: 2 % — ABNORMAL LOW (ref 3–12)
Neutro Abs: 8.4 10*3/uL — ABNORMAL HIGH (ref 1.7–7.7)
Neutrophils Relative %: 90 % — ABNORMAL HIGH (ref 43–77)
Platelets: 156 10*3/uL (ref 150–400)
RBC: 5.6 MIL/uL — AB (ref 3.87–5.11)
RDW: 13 % (ref 11.5–15.5)
WBC: 9.3 10*3/uL (ref 4.0–10.5)

## 2014-03-31 LAB — I-STAT VENOUS BLOOD GAS, ED
Acid-base deficit: 5 mmol/L — ABNORMAL HIGH (ref 0.0–2.0)
Bicarbonate: 24.7 mEq/L — ABNORMAL HIGH (ref 20.0–24.0)
O2 SAT: 70 %
PO2 VEN: 44 mmHg (ref 30.0–45.0)
Patient temperature: 97.7
TCO2: 27 mmol/L (ref 0–100)
pCO2, Ven: 61.8 mmHg — ABNORMAL HIGH (ref 45.0–50.0)
pH, Ven: 7.207 — ABNORMAL LOW (ref 7.250–7.300)

## 2014-03-31 LAB — I-STAT CG4 LACTIC ACID, ED
Lactic Acid, Venous: 3.4 mmol/L (ref 0.5–2.0)
Lactic Acid, Venous: 3.28 mmol/L (ref 0.5–2.0)

## 2014-03-31 LAB — CBG MONITORING, ED
Glucose-Capillary: >600 mg/dL (ref 70–99)
Glucose-Capillary: >600 mg/dL (ref 70–99)
Glucose-Capillary: >600 mg/dL (ref 70–99)

## 2014-03-31 LAB — I-STAT TROPONIN, ED: TROPONIN I, POC: 0 ng/mL (ref 0.00–0.08)

## 2014-03-31 LAB — LIPASE, BLOOD: LIPASE: 36 U/L (ref 11–59)

## 2014-03-31 MED ORDER — ONDANSETRON HCL 4 MG/2ML IJ SOLN
4.0000 mg | Freq: Once | INTRAMUSCULAR | Status: AC
  Administered 2014-03-31: 4 mg via INTRAVENOUS
  Filled 2014-03-31: qty 2

## 2014-03-31 MED ORDER — IOHEXOL 300 MG/ML  SOLN
25.0000 mL | Freq: Once | INTRAMUSCULAR | Status: AC | PRN
  Administered 2014-03-31: 25 mL via ORAL

## 2014-03-31 MED ORDER — DEXTROSE-NACL 5-0.45 % IV SOLN
INTRAVENOUS | Status: DC
  Administered 2014-04-01: 02:00:00 via INTRAVENOUS

## 2014-03-31 MED ORDER — SODIUM CHLORIDE 0.9 % IV BOLUS (SEPSIS)
1000.0000 mL | Freq: Once | INTRAVENOUS | Status: AC
  Administered 2014-03-31: 1000 mL via INTRAVENOUS

## 2014-03-31 MED ORDER — MORPHINE SULFATE 4 MG/ML IJ SOLN
4.0000 mg | Freq: Once | INTRAMUSCULAR | Status: AC
  Administered 2014-03-31: 4 mg via INTRAVENOUS
  Filled 2014-03-31: qty 1

## 2014-03-31 MED ORDER — SODIUM CHLORIDE 0.9 % IV SOLN
INTRAVENOUS | Status: DC
  Administered 2014-03-31: 5.4 [IU]/h via INTRAVENOUS
  Filled 2014-03-31: qty 2.5

## 2014-03-31 MED ORDER — IOHEXOL 300 MG/ML  SOLN
50.0000 mL | Freq: Once | INTRAMUSCULAR | Status: AC | PRN
  Administered 2014-03-31: 50 mL via ORAL

## 2014-03-31 NOTE — ED Notes (Signed)
Shown lactic acid resault to dr,james

## 2014-03-31 NOTE — ED Notes (Addendum)
Pt reprots nvd x 2 weeks. Pt is weak, mucous membranes dry.sts she has been unable to eat or drink anything. pt seen by pcp today and started having cp that increased when doctor pushed on chest.

## 2014-03-31 NOTE — ED Notes (Signed)
Notified EDP of lab results

## 2014-03-31 NOTE — ED Notes (Signed)
1325 blood glucose(critical) resulted 1st sample at 1400. 998 blood glucose (critical) resulted from 2nd sample at 1947.   Notified by main lab

## 2014-03-31 NOTE — ED Notes (Signed)
Main Lab called, interference with CBC. Sample may have to be sent out. Notified MD.

## 2014-03-31 NOTE — ED Notes (Signed)
Notified CT pt is ready for scan

## 2014-03-31 NOTE — ED Notes (Signed)
NOTIFIED RN AND MD OF ELEVATED ISTAT LACTIC AND CHEM 8

## 2014-03-31 NOTE — ED Provider Notes (Signed)
CSN: [id number]     Arrival date & time 03/31/14  1543 History   None    Chief Complaint  Patient presents with  . Emesis  . Chest Pain   (Consider location/radiation/quality/duration/timing/severity/associated sxs/prior Treatment) HPI Comments: 64 yo F hx of HTN, HLD, DMII, CAD s/p PCI, NASH, Cholelithiasis, GERD, Colonic polyps, Colonic AVMs, Cocaine abuse, presents with CC of CP, nausea, vomiting.    Patient is a 64 y.o. female presenting with abdominal pain. The history is provided by the patient. No language interpreter was used.  Abdominal Pain Pain location:  Epigastric Pain quality: cramping   Pain radiates to:  Periumbilical region Pain severity:  Moderate Onset quality:  Gradual Timing:  Intermittent Progression:  Worsening Chronicity:  New Context: retching   Context: not alcohol use, not diet changes, not eating, not previous surgeries, not recent illness, not recent travel and not sick contacts   Relieved by:  Nothing Worsened by:  Nothing tried Ineffective treatments:  None tried Associated symptoms: chest pain, diarrhea, nausea and vomiting   Associated symptoms: no constipation, no cough, no dysuria, no fatigue, no fever, no flatus, no hematemesis, no hematochezia, no hematuria, no melena, no shortness of breath, no sore throat, no vaginal bleeding and no vaginal discharge   Chest pain:    Quality:  Aching   Severity:  Mild   Onset quality:  Gradual   Duration:  1 day   Timing:  Sporadic   Progression:  Resolved   Chronicity:  Chronic Diarrhea:    Quality:  Watery   Severity:  Moderate   Duration:  2 weeks   Timing:  Intermittent   Progression:  Unchanged Nausea:    Severity:  Moderate   Onset quality:  Gradual   Duration:  2 weeks   Timing:  Intermittent   Progression:  Unchanged Vomiting:    Quality:  Stomach contents   Severity:  Moderate   Duration:  2 weeks   Timing:  Intermittent   Progression:  Unchanged Risk factors: being elderly and  multiple surgeries     Past Medical History  Diagnosis Date  . History of colonic polyps   . GERD (gastroesophageal reflux disease)   . Hyperlipidemia   . Diabetes mellitus without mention of complication   . Hypertension   . Myocardial infarction   . Coronary artery disease   . Fatty liver disease, nonalcoholic   . Cholelithiasis   . Iron deficiency anemia   . Angina   . Shortness of breath   . Asthma   . Arthritis     HANDS"  . Cocaine abuse 07/2012    per [facility] drug screen   Past Surgical History  Procedure Laterality Date  . Right colectomy      After colonic perforation  . Tubal ligation    . Coronary angioplasty with stent placement    . Esophagogastroduodenoscopy  11/30/2011    Procedure: ESOPHAGOGASTRODUODENOSCOPY (EGD);  Surgeon: Lafayette Dragon, MD;  Location: [facility];  Service: Endoscopy;  Laterality: N/A;  . Colonoscopy  11/30/2011    Procedure: COLONOSCOPY;  Surgeon: Lafayette Dragon, MD;  Location: [facility]COPY;  Service: Endoscopy;  Laterality: N/A;  . Left heart catheterization with coronary angiogram N/A 02/20/2011    Procedure: LEFT HEART CATHETERIZATION WITH CORONARY ANGIOGRAM;  Surgeon: Clent Demark, MD;  Location: [facility] CATH LAB;  Service: Cardiovascular;  Laterality: N/A;  . Abdominal angiogram  02/20/2011    Procedure: ABDOMINAL ANGIOGRAM;  Surgeon: Clent Demark, MD;  Location: [facility] CATH LAB;  Service: Cardiovascular;;   Family History  Problem Relation Age of Onset  . Heart disease Mother   . Diabetes Mother   . Colon cancer Neg Hx   . Cancer Father     unsure what kind  . Diabetes Sister    History  Substance Use Topics  . Smoking status: Former Smoker -- 0.50 packs/day for 47 years    Types: Cigarettes    Quit date: 05/09/2010  . Smokeless tobacco: Never Used  . Alcohol Use: No   OB History    No data available     Review of Systems  Constitutional: Negative for fever and fatigue.  HENT: Negative for sore throat.   Respiratory:  Negative for cough and shortness of breath.   Cardiovascular: Positive for chest pain.  Gastrointestinal: Positive for nausea, vomiting, abdominal pain and diarrhea. Negative for constipation, melena, hematochezia, flatus and hematemesis.  Genitourinary: Negative for dysuria, hematuria, vaginal bleeding and vaginal discharge.  Musculoskeletal: Negative for myalgias.  Skin: Negative for rash.  Neurological: Negative for dizziness, weakness, light-headedness, numbness and headaches.  Hematological: Negative for adenopathy. Does not bruise/bleed easily.  All other systems reviewed and are negative.     Allergies  Review of patient's allergies indicates no known allergies.  Home Medications   Prior to Admission medications   Medication Sig Start Date End Date Taking? Authorizing Provider  acetaminophen (TYLENOL) 500 MG tablet Take 500 mg by mouth every 6 (six) hours as needed for mild pain.    Historical Provider, MD  ALPRAZolam Duanne Moron) 0.25 MG tablet Take 0.25 mg by mouth 2 (two) times daily.     Historical Provider, MD  amLODipine (NORVASC) 5 MG tablet Take 5 mg by mouth daily.    Historical Provider, MD  aspirin EC 81 MG EC tablet Take 1 tablet (81 mg total) by mouth daily. 02/06/12   Waylan Boga, NP  atorvastatin (LIPITOR) 20 MG tablet Take 20 mg by mouth daily.    Historical Provider, MD  Besifloxacin HCl (BESIVANCE) 0.6 % SUSP Apply 1-2 drops to eye 2 (two) times daily as needed.    Historical Provider, MD  budesonide-formoterol (SYMBICORT) 160-4.5 MCG/ACT inhaler Inhale 2 puffs into the lungs 2 (two) times daily.    Historical Provider, MD  diphenoxylate-atropine (LOMOTIL) 2.5-0.025 MG per tablet Take 1-2 tablets by mouth 3 (three) times daily as needed for diarrhea or loose stools.    Historical Provider, MD  HYDROcodone-acetaminophen (NORCO) 10-325 MG per tablet Take 1 tablet by mouth every 4 (four) hours as needed for moderate pain.    Historical Provider, MD  Insulin Glargine  (LANTUS SOLOSTAR) 100 UNIT/ML SOPN Inject 40 Units into the skin 2 (two) times daily.    Historical Provider, MD  Iron-FA-B Cmp-C-Biot-Probiotic (FUSION PLUS PO) Take 1 capsule by mouth daily.    Historical Provider, MD  ketorolac (ACULAR) 0.4 % SOLN Place 1 drop into both eyes 4 (four) times daily.    Historical Provider, MD  metFORMIN (GLUCOPHAGE) 1000 MG tablet Take 1,000 mg by mouth 2 (two) times daily with a meal.    Historical Provider, MD  metoprolol succinate (TOPROL-XL) 50 MG 24 hr tablet Take 50 mg by mouth every evening. Take with or immediately following a meal.    Historical Provider, MD  moxifloxacin (VIGAMOX) 0.5 % ophthalmic solution Place 1 drop into both eyes 3 (three) times daily.    Historical Provider, MD  nitroGLYCERIN (NITROSTAT) 0.4 MG SL tablet Place 1 tablet (0.4  mg total) under the tongue every 5 (five) minutes as needed. For chest pain 02/06/12   Waylan Boga, NP  oxyCODONE-acetaminophen (PERCOCET) 5-325 MG per tablet Take 1 tablet by mouth every 4 (four) hours as needed for severe pain. 11/13/13   Babette Relic, MD  pantoprazole (PROTONIX) 40 MG tablet Take 40 mg by mouth daily.    Historical Provider, MD  prednisoLONE acetate (PRED FORTE) 1 % ophthalmic suspension Place 1 drop into the left eye 4 (four) times daily.    Historical Provider, MD  predniSONE (DELTASONE) 20 MG tablet 2 tabs po daily x 4 days 11/13/13   Babette Relic, MD  pregabalin (LYRICA) 150 MG capsule Take 150 mg by mouth 2 (two) times daily.     Historical Provider, MD   BP 161/115 mmHg  Pulse 110  Temp(Src) 97.8 F (36.6 C) (Oral)  Resp 20  Ht 4\' 11"  (1.499 m)  SpO2 95% Physical Exam  Constitutional: She is oriented to person, place, and time. She appears well-developed and well-nourished.  HENT:  Head: Normocephalic and atraumatic.  Right Ear: External ear normal.  Left Ear: External ear normal.  Dry oral mucosa  Eyes: Conjunctivae and EOM are normal. Pupils are equal, round, and reactive to  light.  Neck: Normal range of motion. Neck supple.  Cardiovascular: Regular rhythm, normal heart sounds and intact distal pulses.   tachycardia  Pulmonary/Chest: Effort normal. No respiratory distress. She has no wheezes. She has no rales. She exhibits no tenderness.  Abdominal: Soft. Bowel sounds are normal. She exhibits no distension and no mass. There is tenderness. There is no rebound and no guarding.  Epigastric, Periumbilical TTP.  Soft, no guarding, no rebound.   Musculoskeletal: Normal range of motion.  Neurological: She is alert and oriented to person, place, and time.  Skin: Skin is warm and dry.  Nursing note and vitals reviewed.   ED Course  Procedures (including critical care time) Labs Review Labs Reviewed  COMPREHENSIVE METABOLIC PANEL - Abnormal; Notable for the following:    Sodium 129 (*)    Chloride 89 (*)    Glucose, Bld 1325 (*)    BUN 27 (*)    Creatinine, Ser 1.46 (*)    Total Protein 9.7 (*)    Alkaline Phosphatase 168 (*)    Total Bilirubin 1.3 (*)    GFR calc non Af Amer 36 (*)    GFR calc Af Amer 42 (*)    Anion gap 19 (*)    All other components within normal limits  URINALYSIS, ROUTINE W REFLEX MICROSCOPIC - Abnormal; Notable for the following:    Specific Gravity, Urine 1.034 (*)    Glucose, UA >1000 (*)    Ketones, ur 15 (*)    All other components within normal limits  CBC WITH DIFFERENTIAL/PLATELET - Abnormal; Notable for the following:    RBC 5.60 (*)    Hemoglobin 16.6 (*)    HCT 48.4 (*)    Neutrophils Relative % 90 (*)    Neutro Abs 8.4 (*)    Lymphocytes Relative 7 (*)    Monocytes Relative 2 (*)    All other components within normal limits  COMPREHENSIVE METABOLIC PANEL - Abnormal; Notable for the following:    Potassium 6.0 (*)    Glucose, Bld 998 (*)    BUN 25 (*)    Creatinine, Ser 1.57 (*)    Total Protein 8.7 (*)    Alkaline Phosphatase 163 (*)    GFR calc  non Af Amer 33 (*)    GFR calc Af Amer 39 (*)    All other  components within normal limits  URINE MICROSCOPIC-ADD ON - Abnormal; Notable for the following:    Squamous Epithelial / LPF FEW (*)    All other components within normal limits  BASIC METABOLIC PANEL - Abnormal; Notable for the following:    Glucose, Bld 501 (*)    Creatinine, Ser 1.43 (*)    GFR calc non Af Amer 37 (*)    GFR calc Af Amer 43 (*)    All other components within normal limits  I-STAT CG4 LACTIC ACID, ED - Abnormal; Notable for the following:    Lactic Acid, Venous 3.40 (*)    All other components within normal limits  I-STAT CG4 LACTIC ACID, ED - Abnormal; Notable for the following:    Lactic Acid, Venous 3.28 (*)    All other components within normal limits  I-STAT CHEM 8, ED - Abnormal; Notable for the following:    Potassium 6.1 (*)    BUN 30 (*)    Creatinine, Ser 1.30 (*)    Glucose, Bld >700 (*)    Calcium, Ion 1.31 (*)    Hemoglobin 17.7 (*)    HCT 52.0 (*)    All other components within normal limits  I-STAT VENOUS BLOOD GAS, ED - Abnormal; Notable for the following:    pH, Ven 7.207 (*)    pCO2, Ven 61.8 (*)    Bicarbonate 24.7 (*)    Acid-base deficit 5.0 (*)    All other components within normal limits  CBG MONITORING, ED - Abnormal; Notable for the following:    Glucose-Capillary >600 (*)    All other components within normal limits  CBG MONITORING, ED - Abnormal; Notable for the following:    Glucose-Capillary >600 (*)    All other components within normal limits  CBG MONITORING, ED - Abnormal; Notable for the following:    Glucose-Capillary >600 (*)    All other components within normal limits  CBG MONITORING, ED - Abnormal; Notable for the following:    Glucose-Capillary 464 (*)    All other components within normal limits  LIPASE, BLOOD  I-STAT TROPOININ, ED    Imaging Review Ct Abdomen Pelvis Wo Contrast  04/01/2014   CLINICAL DATA:  Vomiting.  Chest pain.  EXAM: CT ABDOMEN AND PELVIS WITHOUT CONTRAST  TECHNIQUE: Multidetector CT  imaging of the abdomen and pelvis was performed following the standard protocol without IV contrast.  COMPARISON:  CT 02/02/2012  FINDINGS: Mild atelectasis at the lung bases. There are coronary artery calcifications. Atherosclerosis of distal descending thoracic aorta.  Diffusely decreased density of the liver parenchyma with fatty sparing about the gallbladder fossa. No evidence of focal hepatic lesion on noncontrast exam. Single calcified gallstone within physiologically distended gallbladder. No pericholecystic inflammation. No biliary dilatation. Spleen and pancreas are normal. No adrenal nodule.  No renal stones, hydronephrosis, or perinephric stranding. Right renal parenchymal thinning and scarring is unchanged from prior exam.  The stomach is decompressed. There are no dilated or thickened small bowel loops. Post partial right hemicolectomy. The appendix is not seen. No bowel obstruction with oral contrast reaching to the sigmoid colon. No colonic wall thickening. No diverticulitis or colitis. No free air, free fluid, or intra-abdominal fluid collection.  The abdominal aorta is normal in caliber with dense atherosclerosis. There is no retroperitoneal adenopathy.  Within the pelvis the bladder is minimally distended. Uterus and adnexa are normal  for age. No pelvic free fluid. Minimal fat in both inguinal canals.  There is multilevel facet arthropathy throughout the lumbar spine. No acute or suspicious osseous abnormality.  IMPRESSION: 1. No acute abnormality in the abdomen/pelvis. 2. Stable chronic findings of hepatic steatosis and cholelithiasis. No CT findings of cholecystitis. Stable right renal scarring. 3. Atherosclerosis.   Electronically Signed   By: Jeb Levering M.D.   On: 04/01/2014 00:11     EKG Interpretation None      MDM   Final diagnoses:  Hyperglycemia  Dehydration  Lactic acidosis   64 yo F hx of HTN, HLD, DMII, CAD s/p PCI, NASH, Cholelithiasis, GERD, Colonic polyps,  Colonic AVMs, Cocaine abuse, presents with CC of CP, abdominal pain, nausea, vomiting.    Pt reports abdominal pain, N/V, diarrhea over 2 week course.  She states she cannot keep any food or drink down at home.  Denies previous occurrence.  States her BG has been running above 200 in the last few days, but not sure if pt is actually her blood sugars as she is vague on details.  Pt with epigastric and periumbilical pain as well, and has had prior abdominal surgeries in past.  Pt also reports CP a few days ago, which was transient and went away with nitroglycerin.  She states she has chronic CP, which sounds like stable angina.  She denies any other symptoms at this time.   Physical exam as above.  Pt tachycardic 110, afebrile, RR WNL, satting well on RA, hypertensive to 161/115.    Labs demonstrate hyperglycemia to 1325, elevated BUN/Cr 27/1.46, normal bicarb 21, with anion gap of 19.  CBC demonstrates hemoconcentration, with normal WBC.  Lipase WNL.  LA elevated to 3.40.  VBG demonstrates acidosis to 7.207, with pCO2 61.8.  Troponin 0.00.  Small ketones in urine 15.    CT abdomen/pelvis demonstrates no acute findings.    Diagnosis of hyperglycemia (DKA, vs hyperosmalar hyperglycemia (given degree of hyperglycemia)), with profound dehydration 2/2 vomiting/diarrhea over 2 week course.    Pt received 3 L NS, and started on Insulin gtt.  On reeval pt reports starting to feel better.  CBG down to 464 on last check.    Medicine consulted for admission.  Pt understands and agrees with plan.   Sinda Du  Discussed pt with my attending Dr. Jeneen Rinks.   Sinda Du, MD 04/01/14 [id number]  Tanna Furry, MD 04/10/14 [phone]

## 2014-04-01 ENCOUNTER — Inpatient Hospital Stay (HOSPITAL_COMMUNITY): Payer: Medicare Other

## 2014-04-01 ENCOUNTER — Other Ambulatory Visit (HOSPITAL_COMMUNITY): Payer: Self-pay

## 2014-04-01 ENCOUNTER — Encounter (HOSPITAL_COMMUNITY): Payer: Self-pay | Admitting: Internal Medicine

## 2014-04-01 DIAGNOSIS — E8729 Other acidosis: Secondary | ICD-10-CM | POA: Insufficient documentation

## 2014-04-01 DIAGNOSIS — K8020 Calculus of gallbladder without cholecystitis without obstruction: Secondary | ICD-10-CM | POA: Diagnosis present

## 2014-04-01 DIAGNOSIS — E860 Dehydration: Secondary | ICD-10-CM | POA: Insufficient documentation

## 2014-04-01 DIAGNOSIS — E875 Hyperkalemia: Secondary | ICD-10-CM | POA: Diagnosis present

## 2014-04-01 DIAGNOSIS — N183 Chronic kidney disease, stage 3 (moderate): Secondary | ICD-10-CM | POA: Diagnosis present

## 2014-04-01 DIAGNOSIS — E872 Acidosis: Secondary | ICD-10-CM | POA: Diagnosis not present

## 2014-04-01 DIAGNOSIS — I252 Old myocardial infarction: Secondary | ICD-10-CM | POA: Diagnosis not present

## 2014-04-01 DIAGNOSIS — J449 Chronic obstructive pulmonary disease, unspecified: Secondary | ICD-10-CM | POA: Diagnosis present

## 2014-04-01 DIAGNOSIS — N179 Acute kidney failure, unspecified: Secondary | ICD-10-CM | POA: Diagnosis present

## 2014-04-01 DIAGNOSIS — E1310 Other specified diabetes mellitus with ketoacidosis without coma: Secondary | ICD-10-CM | POA: Diagnosis present

## 2014-04-01 DIAGNOSIS — I2510 Atherosclerotic heart disease of native coronary artery without angina pectoris: Secondary | ICD-10-CM | POA: Diagnosis present

## 2014-04-01 DIAGNOSIS — F1410 Cocaine abuse, uncomplicated: Secondary | ICD-10-CM | POA: Insufficient documentation

## 2014-04-01 DIAGNOSIS — Z9861 Coronary angioplasty status: Secondary | ICD-10-CM | POA: Diagnosis not present

## 2014-04-01 DIAGNOSIS — Z86010 Personal history of colonic polyps: Secondary | ICD-10-CM | POA: Diagnosis not present

## 2014-04-01 DIAGNOSIS — E1100 Type 2 diabetes mellitus with hyperosmolarity without nonketotic hyperglycemic-hyperosmolar coma (NKHHC): Secondary | ICD-10-CM | POA: Diagnosis present

## 2014-04-01 DIAGNOSIS — E0810 Diabetes mellitus due to underlying condition with ketoacidosis without coma: Secondary | ICD-10-CM | POA: Insufficient documentation

## 2014-04-01 DIAGNOSIS — K219 Gastro-esophageal reflux disease without esophagitis: Secondary | ICD-10-CM | POA: Diagnosis present

## 2014-04-01 DIAGNOSIS — I129 Hypertensive chronic kidney disease with stage 1 through stage 4 chronic kidney disease, or unspecified chronic kidney disease: Secondary | ICD-10-CM | POA: Diagnosis present

## 2014-04-01 DIAGNOSIS — R197 Diarrhea, unspecified: Secondary | ICD-10-CM | POA: Diagnosis not present

## 2014-04-01 DIAGNOSIS — E1101 Type 2 diabetes mellitus with hyperosmolarity with coma: Secondary | ICD-10-CM | POA: Diagnosis not present

## 2014-04-01 DIAGNOSIS — E1122 Type 2 diabetes mellitus with diabetic chronic kidney disease: Secondary | ICD-10-CM | POA: Diagnosis present

## 2014-04-01 DIAGNOSIS — J45909 Unspecified asthma, uncomplicated: Secondary | ICD-10-CM | POA: Diagnosis present

## 2014-04-01 DIAGNOSIS — E1140 Type 2 diabetes mellitus with diabetic neuropathy, unspecified: Secondary | ICD-10-CM | POA: Diagnosis present

## 2014-04-01 DIAGNOSIS — R112 Nausea with vomiting, unspecified: Secondary | ICD-10-CM | POA: Diagnosis present

## 2014-04-01 DIAGNOSIS — E785 Hyperlipidemia, unspecified: Secondary | ICD-10-CM | POA: Diagnosis present

## 2014-04-01 DIAGNOSIS — Z794 Long term (current) use of insulin: Secondary | ICD-10-CM | POA: Diagnosis not present

## 2014-04-01 DIAGNOSIS — R1110 Vomiting, unspecified: Secondary | ICD-10-CM | POA: Diagnosis not present

## 2014-04-01 DIAGNOSIS — Z7982 Long term (current) use of aspirin: Secondary | ICD-10-CM | POA: Diagnosis not present

## 2014-04-01 DIAGNOSIS — I10 Essential (primary) hypertension: Secondary | ICD-10-CM

## 2014-04-01 DIAGNOSIS — Z9114 Patient's other noncompliance with medication regimen: Secondary | ICD-10-CM | POA: Diagnosis present

## 2014-04-01 DIAGNOSIS — Z87891 Personal history of nicotine dependence: Secondary | ICD-10-CM | POA: Diagnosis not present

## 2014-04-01 DIAGNOSIS — R739 Hyperglycemia, unspecified: Secondary | ICD-10-CM | POA: Insufficient documentation

## 2014-04-01 DIAGNOSIS — E870 Hyperosmolality and hypernatremia: Secondary | ICD-10-CM | POA: Diagnosis present

## 2014-04-01 DIAGNOSIS — M1990 Unspecified osteoarthritis, unspecified site: Secondary | ICD-10-CM | POA: Diagnosis present

## 2014-04-01 LAB — MRSA PCR SCREENING: MRSA by PCR: NEGATIVE

## 2014-04-01 LAB — BASIC METABOLIC PANEL
ANION GAP: 8 (ref 5–15)
Anion gap: 10 (ref 5–15)
Anion gap: 10 (ref 5–15)
Anion gap: 6 (ref 5–15)
BUN: 18 mg/dL (ref 6–23)
BUN: 18 mg/dL (ref 6–23)
BUN: 19 mg/dL (ref 6–23)
BUN: 22 mg/dL (ref 6–23)
CALCIUM: 9.3 mg/dL (ref 8.4–10.5)
CO2: 18 mmol/L — ABNORMAL LOW (ref 19–32)
CO2: 24 mmol/L (ref 19–32)
CO2: 24 mmol/L (ref 19–32)
CO2: 25 mmol/L (ref 19–32)
Calcium: 9.5 mg/dL (ref 8.4–10.5)
Calcium: 9 mg/dL (ref 8.4–10.5)
Calcium: 9 mg/dL (ref 8.4–10.5)
Chloride: 109 mmol/L (ref 96–112)
Chloride: 109 mmol/L (ref 96–112)
Chloride: 112 mmol/L (ref 96–112)
Chloride: 113 mmol/L — ABNORMAL HIGH (ref 96–112)
Creatinine, Ser: 1.05 mg/dL (ref 0.50–1.10)
Creatinine, Ser: 1.08 mg/dL (ref 0.50–1.10)
Creatinine, Ser: 1.16 mg/dL — ABNORMAL HIGH (ref 0.50–1.10)
Creatinine, Ser: 1.43 mg/dL — ABNORMAL HIGH (ref 0.50–1.10)
GFR calc Af Amer: 63 mL/min — ABNORMAL LOW (ref >90)
GFR calc Af Amer: 56 mL/min — ABNORMAL LOW (ref >90)
GFR calc non Af Amer: 54 mL/min — ABNORMAL LOW (ref >90)
GFR calc non Af Amer: 48 mL/min — ABNORMAL LOW (ref >90)
GFR, EST AFRICAN AMERICAN: 43 mL/min — AB (ref >90)
GFR, EST AFRICAN AMERICAN: 61 mL/min — AB (ref >90)
GFR, EST NON AFRICAN AMERICAN: 37 mL/min — AB (ref >90)
GFR, EST NON AFRICAN AMERICAN: 52 mL/min — AB (ref >90)
GLUCOSE: 174 mg/dL — AB (ref 70–99)
GLUCOSE: 335 mg/dL — AB (ref 70–99)
Glucose, Bld: 276 mg/dL — ABNORMAL HIGH (ref 70–99)
Glucose, Bld: 501 mg/dL — ABNORMAL HIGH (ref 70–99)
POTASSIUM: 4.4 mmol/L (ref 3.5–5.1)
POTASSIUM: 4.6 mmol/L (ref 3.5–5.1)
POTASSIUM: 4.5 mmol/L (ref 3.5–5.1)
Potassium: 3.5 mmol/L (ref 3.5–5.1)
SODIUM: 140 mmol/L (ref 135–145)
SODIUM: 143 mmol/L (ref 135–145)
Sodium: 145 mmol/L (ref 135–145)
Sodium: 140 mmol/L (ref 135–145)

## 2014-04-01 LAB — RAPID URINE DRUG SCREEN, HOSP PERFORMED
Amphetamines: NOT DETECTED
Barbiturates: NOT DETECTED
Benzodiazepines: POSITIVE — AB
Cocaine: POSITIVE — AB
Opiates: POSITIVE — AB
TETRAHYDROCANNABINOL: NOT DETECTED

## 2014-04-01 LAB — CBC WITH DIFFERENTIAL/PLATELET
BASOS ABS: 0 10*3/uL (ref 0.0–0.1)
Basophils Relative: 0 % (ref 0–1)
Eosinophils Absolute: 0 10*3/uL (ref 0.0–0.7)
Eosinophils Relative: 0 % (ref 0–5)
HEMATOCRIT: 40.7 % (ref 36.0–46.0)
HEMOGLOBIN: 14.1 g/dL (ref 12.0–15.0)
Lymphocytes Relative: 18 % (ref 12–46)
Lymphs Abs: 1.7 10*3/uL (ref 0.7–4.0)
MCH: 29.9 pg (ref 26.0–34.0)
MCHC: 34.6 g/dL (ref 30.0–36.0)
MCV: 86.2 fL (ref 78.0–100.0)
MONO ABS: 0.9 10*3/uL (ref 0.1–1.0)
MONOS PCT: 10 % (ref 3–12)
NEUTROS ABS: 7 10*3/uL (ref 1.7–7.7)
Neutrophils Relative %: 73 % (ref 43–77)
Platelets: 223 10*3/uL (ref 150–400)
RBC: 4.72 MIL/uL (ref 3.87–5.11)
RDW: 13 % (ref 11.5–15.5)
WBC: 9.7 10*3/uL (ref 4.0–10.5)

## 2014-04-01 LAB — GLUCOSE, CAPILLARY
GLUCOSE-CAPILLARY: 364 mg/dL — AB (ref 70–99)
GLUCOSE-CAPILLARY: 244 mg/dL — AB (ref 70–99)
Glucose-Capillary: 122 mg/dL — ABNORMAL HIGH (ref 70–99)
Glucose-Capillary: 122 mg/dL — ABNORMAL HIGH (ref 70–99)
Glucose-Capillary: 150 mg/dL — ABNORMAL HIGH (ref 70–99)
Glucose-Capillary: 232 mg/dL — ABNORMAL HIGH (ref 70–99)
Glucose-Capillary: 169 mg/dL — ABNORMAL HIGH (ref 70–99)

## 2014-04-01 LAB — TROPONIN I
Troponin I: <0.03 ng/mL (ref <0.031)
Troponin I: <0.03 ng/mL (ref <0.031)

## 2014-04-01 LAB — I-STAT ARTERIAL BLOOD GAS, ED
ACID-BASE DEFICIT: 3 mmol/L — AB (ref 0.0–2.0)
BICARBONATE: 22.4 meq/L (ref 20.0–24.0)
O2 Saturation: 94 %
PH ART: 7.343 — AB (ref 7.350–7.450)
PO2 ART: 73 mmHg — AB (ref 80.0–100.0)
Patient temperature: 97.8
TCO2: 24 mmol/L (ref 0–100)
pCO2 arterial: 41 mmHg (ref 35.0–45.0)

## 2014-04-01 LAB — CBG MONITORING, ED
GLUCOSE-CAPILLARY: 378 mg/dL — AB (ref 70–99)
GLUCOSE-CAPILLARY: 258 mg/dL — AB (ref 70–99)
GLUCOSE-CAPILLARY: 187 mg/dL — AB (ref 70–99)
Glucose-Capillary: 256 mg/dL — ABNORMAL HIGH (ref 70–99)
Glucose-Capillary: 464 mg/dL — ABNORMAL HIGH (ref 70–99)

## 2014-04-01 LAB — CLOSTRIDIUM DIFFICILE BY PCR: CDIFFPCR: NEGATIVE

## 2014-04-01 LAB — TSH: TSH: 0.178 u[IU]/mL — AB (ref 0.350–4.500)

## 2014-04-01 MED ORDER — ENOXAPARIN SODIUM 40 MG/0.4ML ~~LOC~~ SOLN
40.0000 mg | Freq: Every day | SUBCUTANEOUS | Status: DC
  Administered 2014-04-01: 40 mg via SUBCUTANEOUS
  Filled 2014-04-01: qty 0.4

## 2014-04-01 MED ORDER — DEXTROSE 50 % IV SOLN: 25.0000 mL | INTRAVENOUS | Status: DC | PRN

## 2014-04-01 MED ORDER — METOPROLOL SUCCINATE ER 50 MG PO TB24
50.0000 mg | ORAL_TABLET | Freq: Every evening | ORAL | Status: DC
  Administered 2014-04-01: 50 mg via ORAL
  Filled 2014-04-01 (×2): qty 1

## 2014-04-01 MED ORDER — INSULIN ASPART 100 UNIT/ML ~~LOC~~ SOLN
0.0000 [IU] | Freq: Three times a day (TID) | SUBCUTANEOUS | Status: DC
  Administered 2014-04-01: 20 [IU] via SUBCUTANEOUS
  Administered 2014-04-01: 4 [IU] via SUBCUTANEOUS
  Administered 2014-04-02: 15 [IU] via SUBCUTANEOUS

## 2014-04-01 MED ORDER — INSULIN ASPART 100 UNIT/ML ~~LOC~~ SOLN: 0.0000 [IU] | Freq: Every day | SUBCUTANEOUS | Status: DC

## 2014-04-01 MED ORDER — BUDESONIDE-FORMOTEROL FUMARATE 160-4.5 MCG/ACT IN AERO
2.0000 | INHALATION_SPRAY | Freq: Two times a day (BID) | RESPIRATORY_TRACT | Status: DC
  Administered 2014-04-01 – 2014-04-02 (×3): 2 via RESPIRATORY_TRACT
  Filled 2014-04-01: qty 6

## 2014-04-01 MED ORDER — HYDROCODONE-ACETAMINOPHEN 10-325 MG PO TABS: 1.0000 | ORAL_TABLET | ORAL | Status: DC | PRN

## 2014-04-01 MED ORDER — ACETAMINOPHEN 650 MG RE SUPP: 650.0000 mg | Freq: Four times a day (QID) | RECTAL | Status: DC | PRN

## 2014-04-01 MED ORDER — INSULIN REGULAR BOLUS VIA INFUSION
0.0000 [IU] | Freq: Three times a day (TID) | INTRAVENOUS | Status: DC
  Filled 2014-04-01: qty 10

## 2014-04-01 MED ORDER — ALPRAZOLAM 0.25 MG PO TABS
0.2500 mg | ORAL_TABLET | Freq: Two times a day (BID) | ORAL | Status: DC
  Administered 2014-04-01 – 2014-04-02 (×3): 0.25 mg via ORAL
  Filled 2014-04-01 (×3): qty 1

## 2014-04-01 MED ORDER — SODIUM CHLORIDE 0.9 % IV SOLN
Freq: Once | INTRAVENOUS | Status: AC
  Administered 2014-04-01: 02:00:00 via INTRAVENOUS

## 2014-04-01 MED ORDER — SODIUM CHLORIDE 0.9 % IV SOLN
INTRAVENOUS | Status: DC
  Administered 2014-04-01 (×2): via INTRAVENOUS

## 2014-04-01 MED ORDER — ASPIRIN EC 81 MG PO TBEC
81.0000 mg | DELAYED_RELEASE_TABLET | Freq: Every day | ORAL | Status: DC
  Administered 2014-04-01 – 2014-04-02 (×2): 81 mg via ORAL
  Filled 2014-04-01 (×2): qty 1

## 2014-04-01 MED ORDER — AMLODIPINE BESYLATE 5 MG PO TABS
5.0000 mg | ORAL_TABLET | Freq: Every day | ORAL | Status: DC
  Administered 2014-04-01 – 2014-04-02 (×2): 5 mg via ORAL
  Filled 2014-04-01 (×2): qty 1

## 2014-04-01 MED ORDER — SODIUM CHLORIDE 0.9 % IV SOLN: INTRAVENOUS | Status: DC

## 2014-04-01 MED ORDER — PREGABALIN 75 MG PO CAPS
150.0000 mg | ORAL_CAPSULE | Freq: Two times a day (BID) | ORAL | Status: DC
  Administered 2014-04-01 – 2014-04-02 (×3): 150 mg via ORAL
  Filled 2014-04-01 (×2): qty 2
  Filled 2014-04-01: qty 6

## 2014-04-01 MED ORDER — GATIFLOXACIN 0.5 % OP SOLN
1.0000 [drp] | Freq: Three times a day (TID) | OPHTHALMIC | Status: DC
  Administered 2014-04-01 – 2014-04-02 (×5): 1 [drp] via OPHTHALMIC
  Filled 2014-04-01: qty 2.5

## 2014-04-01 MED ORDER — NITROGLYCERIN 0.4 MG SL SUBL: 0.4000 mg | SUBLINGUAL_TABLET | SUBLINGUAL | Status: DC | PRN

## 2014-04-01 MED ORDER — ONDANSETRON HCL 4 MG/2ML IJ SOLN: 4.0000 mg | Freq: Four times a day (QID) | INTRAMUSCULAR | Status: DC | PRN

## 2014-04-01 MED ORDER — PANTOPRAZOLE SODIUM 40 MG PO TBEC
40.0000 mg | DELAYED_RELEASE_TABLET | Freq: Every day | ORAL | Status: DC
Start: 2014-04-01 — End: 2014-04-02
  Administered 2014-04-01 – 2014-04-02 (×2): 40 mg via ORAL
  Filled 2014-04-01 (×2): qty 1

## 2014-04-01 MED ORDER — INSULIN GLARGINE 100 UNIT/ML ~~LOC~~ SOLN
10.0000 [IU] | Freq: Once | SUBCUTANEOUS | Status: AC
  Administered 2014-04-01: 10 [IU] via SUBCUTANEOUS
  Filled 2014-04-01: qty 0.1

## 2014-04-01 MED ORDER — ATORVASTATIN CALCIUM 20 MG PO TABS
20.0000 mg | ORAL_TABLET | Freq: Every day | ORAL | Status: DC
  Administered 2014-04-01 – 2014-04-02 (×2): 20 mg via ORAL
  Filled 2014-04-01 (×2): qty 1

## 2014-04-01 MED ORDER — PREDNISOLONE ACETATE 1 % OP SUSP
1.0000 [drp] | Freq: Three times a day (TID) | OPHTHALMIC | Status: DC
  Administered 2014-04-01 – 2014-04-02 (×5): 1 [drp] via OPHTHALMIC
  Filled 2014-04-01: qty 1

## 2014-04-01 MED ORDER — ONDANSETRON HCL 4 MG PO TABS
4.0000 mg | ORAL_TABLET | Freq: Four times a day (QID) | ORAL | Status: DC | PRN
  Administered 2014-04-01: 4 mg via ORAL
  Filled 2014-04-01: qty 1

## 2014-04-01 MED ORDER — DEXTROSE-NACL 5-0.45 % IV SOLN: INTRAVENOUS | Status: DC

## 2014-04-01 MED ORDER — INSULIN GLARGINE 100 UNIT/ML SOLOSTAR PEN: 40.0000 [IU] | PEN_INJECTOR | Freq: Two times a day (BID) | SUBCUTANEOUS | Status: DC

## 2014-04-01 MED ORDER — KETOROLAC TROMETHAMINE 0.5 % OP SOLN
1.0000 [drp] | Freq: Three times a day (TID) | OPHTHALMIC | Status: DC
  Administered 2014-04-01 – 2014-04-02 (×5): 1 [drp] via OPHTHALMIC
  Filled 2014-04-01: qty 3

## 2014-04-01 MED ORDER — ACETAMINOPHEN 325 MG PO TABS: 650.0000 mg | ORAL_TABLET | Freq: Four times a day (QID) | ORAL | Status: DC | PRN

## 2014-04-01 NOTE — Progress Notes (Addendum)
Inpatient Diabetes Program Recommendations  AACE/ADA: New Consensus Statement on Inpatient Glycemic Control (2013)  Target Ranges:  Prepandial:   less than 140 mg/dL      Peak postprandial:   less than 180 mg/dL (1-2 hours)      Critically ill patients:  140 - 180 mg/dL   Results for [name], [name] (MRN [id number]) as of 04/01/2014 09:30  Ref. Range 04/01/2014 07:01 04/01/2014 07:51 04/01/2014 09:15  Glucose-Capillary Latest Range: 70-99 mg/dL 122 (H) 150 (H) 232 (H)    Reason for Visit: N/V, DKA  Diabetes history: DM 2 Outpatient Diabetes medications: Lantus 40 units BID, Metformin 1,000 mg BID Current orders for Inpatient glycemic control: Insulin gtt, Lantus 10 units x 1dose  RN: when transitioning patient off of insulin gtt. Please overlap the basal insulin two hours before insulin gtt is discontinued.  Inpatient Diabetes Program Recommendations Insulin - Basal: Patient was on a large amount of basal insulin at home. Patient's glucose on admission 1,325 mg/dl. Please consider ordering half of patient's home dose basal insulin Lantus 20 units BID starting tonight. Correction (SSI): Noted patient was admitted with N/V, if patient is eating please consider reducing correction scale to Novolog 0-15 units (moderate scale) TID and ordering Novolog 0-5 units QHS for bedtime coverage. If patient is not consuming food please consider Novolog 0-15 units (moderate scale) Q4hrs.   Note 2/25 at 1257pm:  Noted RN did not overlap IV insulin for 2 hours after basal insulin was given per protocol. Patient's glucose rose to 364 mg/dl at 1157am. Patient will need higher basal dose for this evening. Will page MD to notify.   Thanks,  Tama Headings RN, MSN, [facility] Inpatient Diabetes Coordinator Team Pager [phone]

## 2014-04-01 NOTE — Progress Notes (Signed)
Patient ID: [name], female   DOB: [birth year], 64 y.o.   MRN: [id number] [facility] PROGRESS NOTE  SHARNAE FRIEDRICH [id number] DOB: 05-05-1950 DOA: 03/31/2014 PCP: Elyn Peers, MD  Brief narrative:    64 y.o. female with history of diabetes mellitus type 2, CAD status post stenting, hypertension, COPD, hyperlipidemia and polysubstance abuse (has h/o cocaine abuse) who presented to [facility] ED with ongoing nausea, vomiting and diarrhea for past 2 weeks prior to this admission. Patient reports noncompliance with diabetic medications because of limited financial resources and she did not have refills and could not see a primary care physician. On admission, patient was hemodynamically stable. CBG was greater than 600 and on urinalysis glucose was more than 1000 with ketones present in urine. BMP revealed glucose of more than 700, creatinine 1.3, potassium 6.1, lactic acid 3.28. Anion gap was 19. Patient was started on insulin drip for treatment of diabetic ketoacidosis and she was admitted to stepdown unit. CT abdomen and ER did not show acute intra-abdominal findings.  Assessment/Plan:    Principal Problem: Diabetic ketoacidosis / High anion gap metabolic acidosis - Patient was found to have DKA on the admission considering CBG was greater than 600, urinalysis showed glucose of more than 1000 and ketones were present in urine. In addition anion gap was 19 and lactic acid 3.28. - Patient was started on insulin drip per DKA protocol with BMP and CBG checks per DKA protocol. - Patient was admitted to stepdown unit for management of DKA. - This morning, anion gap has closed and CBG's were 187, 258 and 256 over past few hours. We will transition to subcutaneous insulin. - Appreciate diabetic coordinator recommendations. - May transfer to telemetry floor because of persistent hypertension.   Active Problems: Diabetes mellitus, uncontrolled with renal manifestations - Check A1c - Appreciate  diabetic coordinator recommendations. Patient is currently insulin drip which we will stop. We will give 1 dose of Lantus 10 units and then stop insulin drip 2 hours after. Order placed for sliding scale insulin 3 times daily with meals and at bedtime.  Acute renal failure - Likely secondary to DKA. Creatinine was 1.57 on the admission. - Creatinine is improving with IV fluids. Creatinine is 1.43 this morning.  Hyperkalemia - Likely from DKA. Potassium has normalized, it is 3.5 this morning.  Essential hypertension - Continue Norvasc 5 mg daily, metoprolol 50 mg at bedtime  Diabetic neuropathy - Continue Lyrica 150 mg twice daily  Dyslipidemia - Continue atorvastatin   History of drug abuse - Check UDS   DVT Prophylaxis  - Lovenox subcutaneous ordered.   Code Status: Full.  Family Communication:  plan of care discussed with the patient Disposition Plan: Transfer to telemetry floor because of persistent hypertension.  IV access:  Peripheral IV  Procedures and diagnostic studies:    Ct Abdomen Pelvis Wo Contrast 04/01/2014    1. No acute abnormality in the abdomen/pelvis. 2. Stable chronic findings of hepatic steatosis and cholelithiasis. No CT findings of cholecystitis. Stable right renal scarring. 3. Atherosclerosis.     Dg Chest Port 1 View 04/01/2014  Hypoventilatory chest without acute process.     Medical Consultants:  None  Other Consultants:  Diabetic coordinator  IAnti-Infectives:   None    Leisa Lenz, MD  [facility] Pager [phone]  If 7PM-7AM, please contact night-coverage [email or web address] Password [email or web address] 04/01/2014, 9:11 AM   LOS: 0 days    HPI/Subjective: No acute overnight events.  Objective: Filed Vitals:   04/01/14  0430 04/01/14 0500 04/01/14 0548 04/01/14 0824  BP: 160/67 144/63 161/70 168/89  Pulse: 74 68  76  Temp:   97.5 F (36.4 C) 98.6 F (37 C)  TempSrc:   Oral Oral  Resp: 13 14  18   Height:   5\' 3"  (1.6 m)   Weight:    60.5 kg (133 lb 6.1 oz)   SpO2: 97% 97%  100%    Intake/Output Summary (Last 24 hours) at 04/01/14 0911 Last data filed at 04/01/14 0114  Gross per 24 hour  Intake   1000 ml  Output      0 ml  Net   1000 ml    Exam:   General:  Pt is alert, follows commands appropriately, not in acute distress  Cardiovascular: Regular rate and rhythm, S1/S2, no murmurs  Respiratory: Clear to auscultation bilaterally, no wheezing, no crackles, no rhonchi  Abdomen: Soft, non tender, non distended, bowel sounds present  Extremities: No edema, pulses DP and PT palpable bilaterally  Neuro: Grossly nonfocal  Data Reviewed: Basic Metabolic Panel:  Recent Labs Lab 03/31/14 1602 03/31/14 1947 03/31/14 1955 03/31/14 2349  NA 129* 136 139 143  K 4.2 6.0* 6.1* 3.5  CL 89* 96 99 109  CO2 21 25  --  24  GLUCOSE 1325* 998* >700* 501*  BUN 27* 25* 30* 22  CREATININE 1.46* 1.57* 1.30* 1.43*  CALCIUM 10.2 9.8  --  9.3   Liver Function Tests:  Recent Labs Lab 03/31/14 1602 03/31/14 1947  AST 20 16  ALT 21 19  ALKPHOS 168* 163*  BILITOT 1.3* 1.2  PROT 9.7* 8.7*  ALBUMIN 4.7 4.0    Recent Labs Lab 03/31/14 1735  LIPASE 36   No results for input(s): AMMONIA in the last 168 hours. CBC:  Recent Labs Lab 03/31/14 1700 03/31/14 1955  WBC 9.3  --   NEUTROABS 8.4*  --   HGB 16.6* 17.7*  HCT 48.4* 52.0*  MCV 85.2  --   PLT 156  --    Cardiac Enzymes: No results for input(s): CKTOTAL, CKMB, CKMBINDEX, TROPONINI in the last 168 hours. BNP: Invalid input(s): POCBNP CBG:  Recent Labs Lab 04/01/14 0009 04/01/14 0110 04/01/14 0214 04/01/14 0324 04/01/14 0429  GLUCAP 464* 378* 256* 258* 187*    No results found for this or any previous visit (from the past 240 hour(s)).   Scheduled Meds: . ALPRAZolam  0.25 mg Oral BID  . amLODipine  5 mg Oral Daily  . aspirin EC  81 mg Oral Daily  . atorvastatin  20 mg Oral Daily  . budesonide-formoterol  2 puff Inhalation BID  .  enoxaparin (LOVENOX) injection  40 mg Subcutaneous Daily  . gatifloxacin  1 drop Both Eyes TID AC & HS  . insulin aspart  0-20 Units Subcutaneous TID WC  . insulin glargine  10 Units Subcutaneous Once  . ketorolac  1 drop Both Eyes TID AC & HS  . metoprolol succinate  50 mg Oral QPM  . pantoprazole  40 mg Oral Daily  . prednisoLONE acetate  1 drop Left Eye TID AC & HS  . pregabalin  150 mg Oral BID   Continuous Infusions: . sodium chloride 100 mL/hr at 04/01/14 0856  . dextrose 5 % and 0.45% NaCl    . insulin (NOVOLIN-R) infusion

## 2014-04-01 NOTE — ED Provider Notes (Signed)
Patient seen and evaluated. Care discussed with Dr. Justin Mend. Patient's had vomiting over the last several days. Initial glucose reads high, formal lab value 1325. Potassium 6.1. Recheck 4.2. Fluids. Started on glucose stabilizer. With persistence of her vomiting over more than a weeks time and epigastric pain a CT is ordered and pending. She'll need admission.    Tanna Furry, MD 04/01/14 [phone]

## 2014-04-01 NOTE — Progress Notes (Signed)
Received pt from ED at 0530, pt is in good condition, no complaints, RN will continue to monitor.

## 2014-04-01 NOTE — ED Notes (Signed)
Attempted report 

## 2014-04-01 NOTE — Progress Notes (Signed)
NURSING PROGRESS NOTE  [name] [id number] Transfer Data: 04/01/2014 5:30 PM Attending Provider: Robbie Lis, MD [id number] J, MD Code Status: full  [name] is a 64 y.o. female patient transferred from 2c -No acute distress noted.  -No complaints of shortness of breath.  -No complaints of chest pain.   Cardiac Monitoring: Box # 07 in place. Cardiac monitor yields:normal sinus rhythm.  Last Documented Vital Signs: Blood pressure 138/55, pulse 78, temperature 97.7 F (36.5 C), temperature source Oral, resp. rate 18, height 5\' 3"  (1.6 m), weight 60.5 kg (133 lb 6.1 oz), SpO2 98 %.  IV Fluids:  IV in place, occlusive dsg intact without redness, IV cath antecubital right, condition patent and no redness normal saline @ 100.   Allergies:  Review of patient's allergies indicates no known allergies.  Past Medical History:   has a past medical history of History of colonic polyps; GERD (gastroesophageal reflux disease); Hyperlipidemia; Diabetes mellitus without mention of complication; Hypertension; Myocardial infarction; Coronary artery disease; Fatty liver disease, nonalcoholic; Cholelithiasis; Iron deficiency anemia; Angina; Shortness of breath; Asthma; Arthritis; and Cocaine abuse (07/2012).  Past Surgical History:   has past surgical history that includes Right colectomy; Tubal ligation; Coronary angioplasty with stent; Esophagogastroduodenoscopy (11/30/2011); Colonoscopy (11/30/2011); left heart catheterization with coronary angiogram (N/A, 02/20/2011); and abdominal angiogram (02/20/2011).  Social History:   reports that she quit smoking about 3 years ago. Her smoking use included Cigarettes. She has a 23.5 pack-year smoking history. She has never used smokeless tobacco. She reports that she uses illicit drugs (Cocaine). She reports that she does not drink alcohol.  Skin: dry, intact   Patient/Family orientated to room. Information packet given to patient/family.  Admission inpatient armband information verified with patient/family to include name and date of birth and placed on patient arm. Side rails up x 2, fall assessment and education completed with patient/family. Patient/family able to verbalize understanding of risk associated with falls and verbalized understanding to call for assistance before getting out of bed. Call light within reach. Patient/family able to voice and demonstrate understanding of unit orientation instructions.    Will continue to evaluate and treat per MD orders.

## 2014-04-01 NOTE — Progress Notes (Signed)
MD called RN and gave verbal orders to DC glucostabilizer after 2 consecutive CBG's of 122.  Glucostabilizer was stopped and insulin infusion at 0. RN will continue to monitor.

## 2014-04-01 NOTE — H&P (Addendum)
[facility] History and Physical  [name] [id number] DOB: 1950/12/17 DOA: 03/31/2014  Referring physician: ER physician. PCP: Elyn Peers, MD   Chief Complaint: Nausea vomiting and diarrhea.  HPI: [name] is a 64 y.o. female with history of diabetes mellitus type 2, CAD status post stenting, hypertension, COPD, hyperlipidemia and polysubstance abuse presented to the ER because of nausea vomiting and diarrhea. Patient is a poor historian. Patient states that she has been having nausea vomiting diarrhea progressively worsening over the last 2 weeks. Patient states she also has not taken her diabetic medications because she did not have refills. In the ER patient was found her blood sugar more than thousand and had a CT abdomen and pelvis which was unremarkable because of persistent abdominal discomfort. In the ER patient was given fluid boluses and started on IV insulin infusion for hyperosmolar status. Patient denies any chest pain shortness of breath headache or any visual symptoms or focal deficits. Patient states she did have some abdominal discomfort mainly in the epigastric area when she throws up.   Review of Systems: As presented in the history of presenting illness, rest negative.  Past Medical History  Diagnosis Date  . History of colonic polyps   . GERD (gastroesophageal reflux disease)   . Hyperlipidemia   . Diabetes mellitus without mention of complication   . Hypertension   . Myocardial infarction   . Coronary artery disease   . Fatty liver disease, nonalcoholic   . Cholelithiasis   . Iron deficiency anemia   . Angina   . Shortness of breath   . Asthma   . Arthritis     HANDS"  . Cocaine abuse 07/2012    per [facility] drug screen   Past Surgical History  Procedure Laterality Date  . Right colectomy      After colonic perforation  . Tubal ligation    . Coronary angioplasty with stent placement    . Esophagogastroduodenoscopy  11/30/2011     Procedure: ESOPHAGOGASTRODUODENOSCOPY (EGD);  Surgeon: Lafayette Dragon, MD;  Location: [facility];  Service: Endoscopy;  Laterality: N/A;  . Colonoscopy  11/30/2011    Procedure: COLONOSCOPY;  Surgeon: Lafayette Dragon, MD;  Location: [facility];  Service: Endoscopy;  Laterality: N/A;  . Left heart catheterization with coronary angiogram N/A 02/20/2011    Procedure: LEFT HEART CATHETERIZATION WITH CORONARY ANGIOGRAM;  Surgeon: Clent Demark, MD;  Location: [facility] CATH LAB;  Service: Cardiovascular;  Laterality: N/A;  . Abdominal angiogram  02/20/2011    Procedure: ABDOMINAL ANGIOGRAM;  Surgeon: Clent Demark, MD;  Location: [facility] CATH LAB;  Service: Cardiovascular;;   Social History:  reports that she quit smoking about 3 years ago. Her smoking use included Cigarettes. She has a 23.5 pack-year smoking history. She has never used smokeless tobacco. She reports that she uses illicit drugs (Cocaine). She reports that she does not drink alcohol. Where does patient live home. Can patient participate in ADLs? Yes.  No Known Allergies  Family History:  Family History  Problem Relation Age of Onset  . Heart disease Mother   . Diabetes Mother   . Colon cancer Neg Hx   . Cancer Father     unsure what kind  . Diabetes Sister       Prior to Admission medications   Medication Sig Start Date End Date Taking? Authorizing Provider  ALPRAZolam (XANAX) 0.25 MG tablet Take 0.25 mg by mouth 2 (two) times daily.    Yes Historical  Provider, MD  amLODipine (NORVASC) 5 MG tablet Take 5 mg by mouth daily.   Yes Historical Provider, MD  aspirin EC 81 MG EC tablet Take 1 tablet (81 mg total) by mouth daily. 02/06/12  Yes Waylan Boga, NP  atorvastatin (LIPITOR) 20 MG tablet Take 20 mg by mouth daily.   Yes Historical Provider, MD  budesonide-formoterol (SYMBICORT) 160-4.5 MCG/ACT inhaler Inhale 2 puffs into the lungs 2 (two) times daily.   Yes Historical Provider, MD  diphenoxylate-atropine (LOMOTIL) 2.5-0.025 MG per  tablet Take 1-2 tablets by mouth 3 (three) times daily as needed for diarrhea or loose stools.   Yes Historical Provider, MD  HYDROcodone-acetaminophen (NORCO) 10-325 MG per tablet Take 1 tablet by mouth every 4 (four) hours as needed for moderate pain.   Yes Historical Provider, MD  Insulin Glargine (LANTUS SOLOSTAR) 100 UNIT/ML SOPN Inject 40 Units into the skin 2 (two) times daily.   Yes Historical Provider, MD  Iron-FA-B Cmp-C-Biot-Probiotic (FUSION PLUS PO) Take 1 capsule by mouth daily.   Yes Historical Provider, MD  ketorolac (ACULAR) 0.4 % SOLN Place 1 drop into both eyes 4 (four) times daily.   Yes Historical Provider, MD  metFORMIN (GLUCOPHAGE) 1000 MG tablet Take 1,000 mg by mouth 2 (two) times daily with a meal.   Yes Historical Provider, MD  metoprolol succinate (TOPROL-XL) 50 MG 24 hr tablet Take 50 mg by mouth every evening. Take with or immediately following a meal.   Yes Historical Provider, MD  moxifloxacin (VIGAMOX) 0.5 % ophthalmic solution Place 1 drop into both eyes 3 (three) times daily.   Yes Historical Provider, MD  nitroGLYCERIN (NITROSTAT) 0.4 MG SL tablet Place 1 tablet (0.4 mg total) under the tongue every 5 (five) minutes as needed. For chest pain 02/06/12  Yes Waylan Boga, NP  pantoprazole (PROTONIX) 40 MG tablet Take 40 mg by mouth daily.   Yes Historical Provider, MD  prednisoLONE acetate (PRED FORTE) 1 % ophthalmic suspension Place 1 drop into the left eye 4 (four) times daily.   Yes Historical Provider, MD  pregabalin (LYRICA) 150 MG capsule Take 150 mg by mouth 2 (two) times daily.    Yes Historical Provider, MD  acetaminophen (TYLENOL) 500 MG tablet Take 500 mg by mouth every 6 (six) hours as needed for mild pain.    Historical Provider, MD  Besifloxacin HCl (BESIVANCE) 0.6 % SUSP Apply 1-2 drops to eye 2 (two) times daily as needed.    Historical Provider, MD  oxyCODONE-acetaminophen (PERCOCET) 5-325 MG per tablet Take 1 tablet by mouth every 4 (four) hours as  needed for severe pain. Patient not taking: Reported on 03/31/2014 11/13/13   Babette Relic, MD  predniSONE (DELTASONE) 20 MG tablet 2 tabs po daily x 4 days Patient not taking: Reported on 03/31/2014 11/13/13   Babette Relic, MD    Physical Exam: Filed Vitals:   04/01/14 0030 04/01/14 0100 04/01/14 0130 04/01/14 0200  BP: 138/51 119/49 115/43 133/72  Pulse: 69 67 64 74  Temp:      TempSrc:      Resp: 14 12 11 21   Height:      SpO2: 95% 96% 96% 94%     General:  Moderately built and poorly nourished.  Eyes: Anicteric no pallor.  ENT: No discharge from the ears eyes nose or mouth.  Neck: No mass felt.  Cardiovascular: S1-S2 heard.  Respiratory: No rhonchi or crepitations.  Abdomen: Soft nontender bowel sounds present.  Skin: No rash.  Musculoskeletal: No edema.  Psychiatric: Appears normal.  Neurologic: Alert awake oriented to time place and person. Moves all extremities.  Labs on Admission:  Basic Metabolic Panel:  Recent Labs Lab 03/31/14 1602 03/31/14 1947 03/31/14 1955 03/31/14 2349  NA 129* 136 139 143  K 4.2 6.0* 6.1* 3.5  CL 89* 96 99 109  CO2 21 25  --  24  GLUCOSE 1325* 998* >700* 501*  BUN 27* 25* 30* 22  CREATININE 1.46* 1.57* 1.30* 1.43*  CALCIUM 10.2 9.8  --  9.3   Liver Function Tests:  Recent Labs Lab 03/31/14 1602 03/31/14 1947  AST 20 16  ALT 21 19  ALKPHOS 168* 163*  BILITOT 1.3* 1.2  PROT 9.7* 8.7*  ALBUMIN 4.7 4.0    Recent Labs Lab 03/31/14 1735  LIPASE 36   No results for input(s): AMMONIA in the last 168 hours. CBC:  Recent Labs Lab 03/31/14 1700 03/31/14 1955  WBC 9.3  --   NEUTROABS 8.4*  --   HGB 16.6* 17.7*  HCT 48.4* 52.0*  MCV 85.2  --   PLT 156  --    Cardiac Enzymes: No results for input(s): CKTOTAL, CKMB, CKMBINDEX, TROPONINI in the last 168 hours.  BNP (last 3 results) No results for input(s): BNP in the last 8760 hours.  ProBNP (last 3 results)  Recent Labs  11/13/13 0747  PROBNP 35.1     CBG:  Recent Labs Lab 03/31/14 2150 03/31/14 2257 04/01/14 0009 04/01/14 0110 04/01/14 0214  GLUCAP >600* >600* 464* 378* 256*    Radiological Exams on Admission: Ct Abdomen Pelvis Wo Contrast  04/01/2014   CLINICAL DATA:  Vomiting.  Chest pain.  EXAM: CT ABDOMEN AND PELVIS WITHOUT CONTRAST  TECHNIQUE: Multidetector CT imaging of the abdomen and pelvis was performed following the standard protocol without IV contrast.  COMPARISON:  CT 02/02/2012  FINDINGS: Mild atelectasis at the lung bases. There are coronary artery calcifications. Atherosclerosis of distal descending thoracic aorta.  Diffusely decreased density of the liver parenchyma with fatty sparing about the gallbladder fossa. No evidence of focal hepatic lesion on noncontrast exam. Single calcified gallstone within physiologically distended gallbladder. No pericholecystic inflammation. No biliary dilatation. Spleen and pancreas are normal. No adrenal nodule.  No renal stones, hydronephrosis, or perinephric stranding. Right renal parenchymal thinning and scarring is unchanged from prior exam.  The stomach is decompressed. There are no dilated or thickened small bowel loops. Post partial right hemicolectomy. The appendix is not seen. No bowel obstruction with oral contrast reaching to the sigmoid colon. No colonic wall thickening. No diverticulitis or colitis. No free air, free fluid, or intra-abdominal fluid collection.  The abdominal aorta is normal in caliber with dense atherosclerosis. There is no retroperitoneal adenopathy.  Within the pelvis the bladder is minimally distended. Uterus and adnexa are normal for age. No pelvic free fluid. Minimal fat in both inguinal canals.  There is multilevel facet arthropathy throughout the lumbar spine. No acute or suspicious osseous abnormality.  IMPRESSION: 1. No acute abnormality in the abdomen/pelvis. 2. Stable chronic findings of hepatic steatosis and cholelithiasis. No CT findings of  cholecystitis. Stable right renal scarring. 3. Atherosclerosis.   Electronically Signed   By: Jeb Levering M.D.   On: 04/01/2014 00:11    EKG: Independently reviewed. Normal sinus rhythm with nonspecific T-wave changes.  Assessment/Plan Principal Problem:   Hyperosmolar non-ketotic state in patient with type 2 diabetes mellitus Active Problems:   Essential hypertension   Nausea vomiting and diarrhea  CAD (coronary artery disease)   1. Hyperosmolar nonketotic state and diabetes mellitus type 2 - probably too sedated by patient's noncompliance with diabetic medications and in addition patient also got dehydrated from persistent nausea and vomiting. Check hemoglobin A1c. At this time we will continue with IV insulin infusion and IV fluids and if patient is able to tolerate only then we will discontinue IV insulin infusion once blood sugars less than 250 otherwise will need to be on D5 normal saline until patient can tolerate orally. 2. Nausea vomiting and diarrhea - CT abdomen and pelvis does show gallstones but LFTs and lipase unremarkable and the patient's abdomen is appearing benign at this time. I have ordered HIDA scan to rule out any acute cholecystitis which appears less likely. Check stool studies. 3. CAD status post stenting - since patient has epigastric discomfort we will cycle cardiac markers. 4. Hypertension - continue present medications. 5. Hyperlipidemia - continue present medications. 6. Possible COPD - continue inhalers. 7. History of polysubstance abuse - check urine drug screen. 8. Chronic kidney disease stage 3 - follow metabolic panel closely.   DVT Prophylaxis Lovenox.  Code Status: Full code.  Family Communication: None.  Disposition Plan: Admit to inpatient.    KAKRAKANDY,ARSHAD N. [facility] Pager [phone].  If 7PM-7AM, please contact night-coverage [email or web address] Password [email or web address] 04/01/2014, 2:25 AM

## 2014-04-01 NOTE — Care Management Note (Signed)
    Page 1 of 1   04/01/2014     3:14:55 PM CARE MANAGEMENT NOTE 04/01/2014  Patient:  [name], [name]   Account Number:  [id number]  Date Initiated:  04/01/2014  Documentation initiated by:  Dagon Budai  Subjective/Objective Assessment:   dx DKA; lives with family    PCP  Van Tassell  CM consult      Status of service:  Completed, signed off  Per UR Regulation:  Reviewed for med. necessity/level of care/duration of stay  Comments:  04/01/14 1400 [place] Received referral re medication needs.  Talked with pt who states she has no difficulty obtaining meds, uses [facility] and receives 90-day supply of meds via mail.  Now states she did not run out of meds, has all of them. Stated she needed no assistance with meds.

## 2014-04-02 DIAGNOSIS — R1110 Vomiting, unspecified: Secondary | ICD-10-CM

## 2014-04-02 DIAGNOSIS — R112 Nausea with vomiting, unspecified: Secondary | ICD-10-CM | POA: Insufficient documentation

## 2014-04-02 LAB — GLUCOSE, CAPILLARY: Glucose-Capillary: 338 mg/dL — ABNORMAL HIGH (ref 70–99)

## 2014-04-02 MED ORDER — INSULIN GLARGINE 100 UNIT/ML SOLOSTAR PEN
40.0000 [IU] | PEN_INJECTOR | Freq: Two times a day (BID) | SUBCUTANEOUS | Status: DC
Start: 2014-04-02 — End: 2015-02-24

## 2014-04-02 MED ORDER — INSULIN GLARGINE 100 UNIT/ML ~~LOC~~ SOLN
20.0000 [IU] | Freq: Two times a day (BID) | SUBCUTANEOUS | Status: DC
  Administered 2014-04-02: 20 [IU] via SUBCUTANEOUS
  Filled 2014-04-02 (×3): qty 0.2

## 2014-04-02 NOTE — Progress Notes (Signed)
NURSING PROGRESS NOTE  RYNLEE COSMAN [id number] Discharge Data: 04/02/2014 12:40 PM Attending Provider: Robbie Lis, MD [id number] J, MD     [name] to be D/C'd Home per MD order.  Discussed with the patient the After Visit Summary and all questions fully answered. All IV's discontinued with no bleeding noted. All belongings returned to patient for patient to take home.   Last Vital Signs:  Blood pressure 118/71, pulse 74, temperature 99.3 F (37.4 C), temperature source Oral, resp. rate 20, height 5\' 3"  (1.6 m), weight 63.5 kg (139 lb 15.9 oz), SpO2 96 %.  Discharge Medication List   Medication List    STOP taking these medications        oxyCODONE-acetaminophen 5-325 MG per tablet  Commonly known as:  PERCOCET     predniSONE 20 MG tablet  Commonly known as:  DELTASONE      TAKE these medications        acetaminophen 500 MG tablet  Commonly known as:  TYLENOL  Take 500 mg by mouth every 6 (six) hours as needed for mild pain.     ALPRAZolam 0.25 MG tablet  Commonly known as:  XANAX  Take 0.25 mg by mouth 2 (two) times daily.     amLODipine 5 MG tablet  Commonly known as:  NORVASC  Take 5 mg by mouth daily.     aspirin 81 MG EC tablet  Take 1 tablet (81 mg total) by mouth daily.     atorvastatin 20 MG tablet  Commonly known as:  LIPITOR  Take 20 mg by mouth daily.     BESIVANCE 0.6 % Susp  Generic drug:  Besifloxacin HCl  Apply 1-2 drops to eye 2 (two) times daily as needed.     budesonide-formoterol 160-4.5 MCG/ACT inhaler  Commonly known as:  SYMBICORT  Inhale 2 puffs into the lungs 2 (two) times daily.     diphenoxylate-atropine 2.5-0.025 MG per tablet  Commonly known as:  LOMOTIL  Take 1-2 tablets by mouth 3 (three) times daily as needed for diarrhea or loose stools.     FUSION PLUS PO  Take 1 capsule by mouth daily.     HYDROcodone-acetaminophen 10-325 MG per tablet  Commonly known as:  NORCO  Take 1 tablet by mouth every 4 (four)  hours as needed for moderate pain.     Insulin Glargine 100 UNIT/ML Solostar Pen  Commonly known as:  LANTUS SOLOSTAR  Inject 40 Units into the skin 2 (two) times daily.     ketorolac 0.4 % Soln  Commonly known as:  ACULAR  Place 1 drop into both eyes 4 (four) times daily.     metFORMIN 1000 MG tablet  Commonly known as:  GLUCOPHAGE  Take 1,000 mg by mouth 2 (two) times daily with a meal.     metoprolol succinate 50 MG 24 hr tablet  Commonly known as:  TOPROL-XL  Take 50 mg by mouth every evening. Take with or immediately following a meal.     moxifloxacin 0.5 % ophthalmic solution  Commonly known as:  VIGAMOX  Place 1 drop into both eyes 3 (three) times daily.     nitroGLYCERIN 0.4 MG SL tablet  Commonly known as:  NITROSTAT  Place 1 tablet (0.4 mg total) under the tongue every 5 (five) minutes as needed. For chest pain     pantoprazole 40 MG tablet  Commonly known as:  PROTONIX  Take 40 mg by mouth daily.  prednisoLONE acetate 1 % ophthalmic suspension  Commonly known as:  PRED FORTE  Place 1 drop into the left eye 4 (four) times daily.     pregabalin 150 MG capsule  Commonly known as:  LYRICA  Take 150 mg by mouth 2 (two) times daily.

## 2014-04-02 NOTE — Discharge Summary (Signed)
Physician Discharge Summary  [name] [id number] DOB: [birth year] DOA: 03/31/2014  PCP: Dominique Peers, MD  Admit date: 03/31/2014 Discharge date: 04/02/2014  Recommendations for Outpatient Follow-up:  1. Patient encouraged to follow with primary care physician in one to 2 weeks after discharge. 2. Patient knows to continue taking same insulin regimen per prior home regimen. Prescription provided for Lantus.  Discharge Diagnoses:  Principal Problem:   Hyperosmolar non-ketotic state in patient with type 2 diabetes mellitus Active Problems:   Essential hypertension   Nausea vomiting and diarrhea   CAD (coronary artery disease)   Diabetic ketoacidosis without coma associated with diabetes mellitus due to underlying condition   High anion gap metabolic acidosis   Cocaine abuse    Discharge Condition: stable   Diet recommendation: as tolerated   History of present illness:  64 y.o. female with history of diabetes mellitus type 2, CAD status post stenting, hypertension, COPD, hyperlipidemia and polysubstance abuse (has h/o cocaine abuse) who presented to [facility] ED with ongoing nausea, vomiting and diarrhea for past 2 weeks prior to this admission. Patient reports noncompliance with diabetic medications because of limited financial resources and she did not have refills and could not see a primary care physician. On admission, patient was hemodynamically stable. CBG was greater than 600 and on urinalysis glucose was more than 1000 with ketones present in urine. BMP revealed glucose of more than 700, creatinine 1.3, potassium 6.1, lactic acid 3.28. Anion gap was 19. Patient was started on insulin drip for treatment of diabetic ketoacidosis and she was admitted to stepdown unit. CT abdomen and ER did not show acute intra-abdominal findings.  Assessment/Plan:    Principal Problem: Diabetic ketoacidosis / High anion gap metabolic acidosis - Patient was found to have DKA on the  admission. DKA criteria included CBG greater than 600, urinalysis with glucose of more than 1000 and ketones present in urine. In addition anion gap was 19 and lactic acid 3.28. - Patient was admitted to stepdown unit and started on insulin drip per DKA protocol with BMP and CBG checks per DKA protocol. - Patient is off of insulin drip. The diabetic coordinator has seen the patient in consultation. Recommended to start Lantus 20 units twice daily. - Patient is stable for discharge and will continue taking insulin per prior home regimen, 40 units twice daily because her CBGs were still in 200- 300 range. DKA resolved. She was encouraged to be compliant with insulin regimen. Prescription for Lantus provided. She was encouraged to follow-up with primary care physician in one to 2 weeks after discharge to make sure her CBGs are controlled.  Active Problems: Diabetes mellitus, uncontrolled with renal manifestations - A1c is pending. This will be followed up by primary care physician. - As mentioned above, DKA resolved.  - Patient encouraged to be compliant with insulin regimen.  Acute renal failure - Likely secondary to DKA. Creatinine was 1.57 on the admission. - Creatinine improved with IV fluids. Creatinine is within normal limits this morning.  Hyperkalemia - Likely from DKA. Potassium has normalized, it is  4.5 prior to discharge.   Essential hypertension - Continue Norvasc 5 mg daily, metoprolol 50 mg at bedtime  Diabetic neuropathy - Continue Lyrica 150 mg twice daily  Dyslipidemia - Continue atorvastatin   History of drug abuse - Urine drug screen positive for benzodiazepines, cocaine and opiates.   DVT Prophylaxis  - Lovenox subcutaneous ordered while patient in hospital.   Code Status: Full.  Family Communication: plan of  care discussed with the patient   IV access:  Peripheral IV  Procedures and diagnostic studies:   Ct Abdomen Pelvis Wo Contrast  04/01/2014 1. No acute abnormality in the abdomen/pelvis. 2. Stable chronic findings of hepatic steatosis and cholelithiasis. No CT findings of cholecystitis. Stable right renal scarring. 3. Atherosclerosis.   Dg Chest Port 1 View 04/01/2014 Hypoventilatory chest without acute process.   Medical Consultants:  None  Other Consultants:  Diabetic coordinator  IAnti-Infectives:   None     Signed:  Leisa Lenz, MD  [facility] 04/02/2014, 9:25 AM  Pager #: [phone]   Discharge Exam: Filed Vitals:   04/02/14 0628  BP: 118/71  Pulse: 74  Temp:   Resp: 20   Filed Vitals:   04/01/14 1956 04/01/14 2111 04/02/14 0625 04/02/14 0628  BP:  117/49  118/71  Pulse:  66  74  Temp:  99.3 F (37.4 C)    TempSrc:  Oral    Resp:  18  20  Height:      Weight:   63.5 kg (139 lb 15.9 oz)   SpO2: 97% 100%  96%    General: Pt is alert, follows commands appropriately, not in acute distress Cardiovascular: Regular rhythm, rate controlled, appreciate S1, S2 Respiratory: Clear to auscultation bilaterally, no wheezing, no crackles, no rhonchi Abdominal: Nontender and nondistended abdomen, no rebound tenderness, bowel sounds +, no guarding Extremities: no edema, no cyanosis, pulses palpable bilaterally DP and PT Neuro: No focal deficits  Discharge Instructions  Discharge Instructions    Call MD for:  difficulty breathing, headache or visual disturbances    Complete by:  As directed      Call MD for:  persistant nausea and vomiting    Complete by:  As directed      Call MD for:  severe uncontrolled pain    Complete by:  As directed      Diet - low sodium heart healthy    Complete by:  As directed      Discharge instructions    Complete by:  As directed   1. Please continue same insulin regimen prior to the admission. Compliance encouraged while pt in hospital     Increase activity slowly    Complete by:  As directed             Medication List    STOP  taking these medications        oxyCODONE-acetaminophen 5-325 MG per tablet  Commonly known as:  PERCOCET     predniSONE 20 MG tablet  Commonly known as:  DELTASONE      TAKE these medications        acetaminophen 500 MG tablet  Commonly known as:  TYLENOL  Take 500 mg by mouth every 6 (six) hours as needed for mild pain.     ALPRAZolam 0.25 MG tablet  Commonly known as:  XANAX  Take 0.25 mg by mouth 2 (two) times daily.     amLODipine 5 MG tablet  Commonly known as:  NORVASC  Take 5 mg by mouth daily.     aspirin 81 MG EC tablet  Take 1 tablet (81 mg total) by mouth daily.     atorvastatin 20 MG tablet  Commonly known as:  LIPITOR  Take 20 mg by mouth daily.     BESIVANCE 0.6 % Susp  Generic drug:  Besifloxacin HCl  Apply 1-2 drops to eye 2 (two) times daily as needed.     budesonide-formoterol  160-4.5 MCG/ACT inhaler  Commonly known as:  SYMBICORT  Inhale 2 puffs into the lungs 2 (two) times daily.     diphenoxylate-atropine 2.5-0.025 MG per tablet  Commonly known as:  LOMOTIL  Take 1-2 tablets by mouth 3 (three) times daily as needed for diarrhea or loose stools.     FUSION PLUS PO  Take 1 capsule by mouth daily.     HYDROcodone-acetaminophen 10-325 MG per tablet  Commonly known as:  NORCO  Take 1 tablet by mouth every 4 (four) hours as needed for moderate pain.     Insulin Glargine 100 UNIT/ML Solostar Pen  Commonly known as:  LANTUS SOLOSTAR  Inject 40 Units into the skin 2 (two) times daily.     ketorolac 0.4 % Soln  Commonly known as:  ACULAR  Place 1 drop into both eyes 4 (four) times daily.     metFORMIN 1000 MG tablet  Commonly known as:  GLUCOPHAGE  Take 1,000 mg by mouth 2 (two) times daily with a meal.     metoprolol succinate 50 MG 24 hr tablet  Commonly known as:  TOPROL-XL  Take 50 mg by mouth every evening. Take with or immediately following a meal.     moxifloxacin 0.5 % ophthalmic solution  Commonly known as:  VIGAMOX  Place 1 drop  into both eyes 3 (three) times daily.     nitroGLYCERIN 0.4 MG SL tablet  Commonly known as:  NITROSTAT  Place 1 tablet (0.4 mg total) under the tongue every 5 (five) minutes as needed. For chest pain     pantoprazole 40 MG tablet  Commonly known as:  PROTONIX  Take 40 mg by mouth daily.     prednisoLONE acetate 1 % ophthalmic suspension  Commonly known as:  PRED FORTE  Place 1 drop into the left eye 4 (four) times daily.     pregabalin 150 MG capsule  Commonly known as:  LYRICA  Take 150 mg by mouth 2 (two) times daily.           Follow-up Information    Follow up with Dominique Peers, MD. Schedule an appointment as soon as possible for a visit in 1 week.   Specialty:  Family Medicine   Why:  Follow up appt after recent hospitalization   Contact information:   [address] [place] [address] [phone]        The results of significant diagnostics from this hospitalization (including imaging, microbiology, ancillary and laboratory) are listed below for reference.    Significant Diagnostic Studies: Ct Abdomen Pelvis Wo Contrast  04/01/2014   CLINICAL DATA:  Vomiting.  Chest pain.  EXAM: CT ABDOMEN AND PELVIS WITHOUT CONTRAST  TECHNIQUE: Multidetector CT imaging of the abdomen and pelvis was performed following the standard protocol without IV contrast.  COMPARISON:  CT 02/02/2012  FINDINGS: Mild atelectasis at the lung bases. There are coronary artery calcifications. Atherosclerosis of distal descending thoracic aorta.  Diffusely decreased density of the liver parenchyma with fatty sparing about the gallbladder fossa. No evidence of focal hepatic lesion on noncontrast exam. Single calcified gallstone within physiologically distended gallbladder. No pericholecystic inflammation. No biliary dilatation. Spleen and pancreas are normal. No adrenal nodule.  No renal stones, hydronephrosis, or perinephric stranding. Right renal parenchymal thinning and scarring is unchanged  from prior exam.  The stomach is decompressed. There are no dilated or thickened small bowel loops. Post partial right hemicolectomy. The appendix is not seen. No bowel obstruction with  oral contrast reaching to the sigmoid colon. No colonic wall thickening. No diverticulitis or colitis. No free air, free fluid, or intra-abdominal fluid collection.  The abdominal aorta is normal in caliber with dense atherosclerosis. There is no retroperitoneal adenopathy.  Within the pelvis the bladder is minimally distended. Uterus and adnexa are normal for age. No pelvic free fluid. Minimal fat in both inguinal canals.  There is multilevel facet arthropathy throughout the lumbar spine. No acute or suspicious osseous abnormality.  IMPRESSION: 1. No acute abnormality in the abdomen/pelvis. 2. Stable chronic findings of hepatic steatosis and cholelithiasis. No CT findings of cholecystitis. Stable right renal scarring. 3. Atherosclerosis.   Electronically Signed   By: Jeb Levering M.D.   On: 04/01/2014 00:11   Dg Chest Port 1 View  04/01/2014   CLINICAL DATA:  Epigastric pain and shortness of breath.  EXAM: PORTABLE CHEST - 1 VIEW  COMPARISON:  11/13/2013  FINDINGS: Lung volumes are low leading to accentuation of cardiac size and crowding of bronchovascular structures. The mediastinal contours are normal, atherosclerosis of the thoracic aorta again seen. The lungs are clear. Pulmonary vasculature is normal. No consolidation, pleural effusion, or pneumothorax. No acute osseous abnormalities are seen. Degenerative change noted in the left shoulder.  IMPRESSION: Hypoventilatory chest without acute process.   Electronically Signed   By: Jeb Levering M.D.   On: 04/01/2014 02:45    Microbiology: Recent Results (from the past 240 hour(s))  MRSA PCR Screening     Status: None   Collection Time: 04/01/14  5:37 AM  Result Value Ref Range Status   MRSA by PCR NEGATIVE NEGATIVE Final    Comment:        The GeneXpert MRSA  Assay (FDA approved for NASAL specimens only), is one component of a comprehensive MRSA colonization surveillance program. It is not intended to diagnose MRSA infection nor to guide or monitor treatment for MRSA infections.   Clostridium Difficile by PCR     Status: None   Collection Time: 04/01/14  1:39 PM  Result Value Ref Range Status   C difficile by pcr NEGATIVE NEGATIVE Final     Labs: Basic Metabolic Panel:  Recent Labs Lab 03/31/14 1947 03/31/14 1955 03/31/14 2349 04/01/14 0832 04/01/14 1000 04/01/14 1505  NA 136 139 143 145 140 140  K 6.0* 6.1* 3.5 4.4 4.6 4.5  CL 96 99 109 113* 109 112  CO2 25  --  24 24 25  18*  GLUCOSE 998* >700* 501* 174* 335* 276*  BUN 25* 30* 22 18 18 19   CREATININE 1.57* 1.30* 1.43* 1.05 1.16* 1.08  CALCIUM 9.8  --  9.3 9.5 9.0 9.0   Liver Function Tests:  Recent Labs Lab 03/31/14 1602 03/31/14 1947  AST 20 16  ALT 21 19  ALKPHOS 168* 163*  BILITOT 1.3* 1.2  PROT 9.7* 8.7*  ALBUMIN 4.7 4.0    Recent Labs Lab 03/31/14 1735  LIPASE 36   No results for input(s): AMMONIA in the last 168 hours. CBC:  Recent Labs Lab 03/31/14 1700 03/31/14 1955 04/01/14 0832  WBC 9.3  --  9.7  NEUTROABS 8.4*  --  7.0  HGB 16.6* 17.7* 14.1  HCT 48.4* 52.0* 40.7  MCV 85.2  --  86.2  PLT 156  --  223   Cardiac Enzymes:  Recent Labs Lab 04/01/14 0832 04/01/14 1304 04/01/14 2014  TROPONINI <0.03 <0.03 <0.03   BNP: BNP (last 3 results) No results for input(s): BNP in the last 8760  hours.  ProBNP (last 3 results)  Recent Labs  11/13/13 0747  PROBNP 35.1    CBG:  Recent Labs Lab 04/01/14 0915 04/01/14 1157 04/01/14 1646 04/01/14 2108 04/02/14 0905  GLUCAP 232* 364* 169* 244* 338*    Time coordinating discharge: Over 30 minutes

## 2014-04-02 NOTE — Progress Notes (Signed)
Inpatient Diabetes Program Recommendations  AACE/ADA: New Consensus Statement on Inpatient Glycemic Control (2013)  Target Ranges:  Prepandial:   less than 140 mg/dL      Peak postprandial:   less than 180 mg/dL (1-2 hours)      Critically ill patients:  140 - 180 mg/dL   Reason for assessment: elevated CBG yesterday  Diabetes history: Type 2 Outpatient Diabetes medications: Lantus 40 units bid, Metformin 1000mg  bid Current orders for Inpatient glycemic control: Novolog correction scale 0-20 units tid  Patient was on a large amount of basal insulin at home and has only received Lantus 10 units yesterday am since coming off the insulin drip.  Patient's glucose on admission 1,325 mg/dl.  Please consider ordering half of patient's home dose basal insulin Lantus 20 units BID starting now and Novolog correction scale 0-5 units QHS for bedtime coverage- continue Novolog correction 0-20 units tid.    Gentry Fitz, RN, BA, MHA, CDE Diabetes Coordinator Inpatient Diabetes Program  [phone] (Team Pager) [phone] [facility]) 04/02/2014 8:47 AM

## 2014-04-02 NOTE — Discharge Instructions (Signed)
Diabetic Ketoacidosis °Diabetic ketoacidosis (DKA) is a life-threatening complication of type 1 diabetes. It must be quickly recognized and treated. Treatment requires hospitalization. °CAUSES  °When there is no insulin in the body, glucose (sugar) cannot be used, and the body breaks down fat for energy. When fat breaks down, acids (ketones) build up in the blood. Very high levels of glucose and high levels of acids lead to severe loss of body fluids (dehydration) and other dangerous chemical changes. This stresses your vital organs and can cause coma or death. °SIGNS AND SYMPTOMS  °· Tiredness (fatigue). °· Weight loss. °· Excessive thirst. °· Ketones in your urine. °· Light-headedness. °· Fruity or sweet smelling breath. °· Excessive urination. °· Visual changes. °· Confusion or irritability. °· Nausea or vomiting. °· Rapid breathing. °· Stomachache or abdominal pain. °DIAGNOSIS  °Your health care provider will diagnose DKA based on your history, physical exam, and blood tests. The health care provider will check to see if you have another illness that caused you to go into DKA. Most of this will be done quickly in an emergency room. °TREATMENT  °· Fluid replacement to correct dehydration. °· Insulin. °· Correction of electrolytes, such as potassium and sodium. °· Antibiotic medicines. °PREVENTION °· Always take your insulin. Do not skip your insulin injections. °· If you are sick, treat yourself quickly. Your body often needs more insulin to fight the illness. °· Check your blood glucose regularly. °· Check urine ketones if your blood glucose is greater than 240 milligrams per deciliter (mg/dL). °· Do not use outdated (expired) insulin. °· If your blood glucose is high, drink plenty of fluids. This helps flush out ketones. °HOME CARE INSTRUCTIONS  °· If you are sick, follow the advice of your health care provider. °· To prevent dehydration, drink enough water and fluids to keep your urine clear or pale  yellow. °¨ If you cannot eat, alternate between drinking fluids with sugar (soda, juices, flavored gelatin) and salty fluids (broth, bouillon). °¨ If you can eat, follow your usual diet and drink sugar-free liquids (water, diet drinks). °· Always take your usual dose of insulin. If you cannot eat or if your glucose is getting too low, call your health care provider for further instructions. °· Continue to monitor your blood or urine ketones every 3-4 hours around the clock. Set your alarm clock or have someone wake you up. If you are too sick, have someone test it for you. °· Rest and avoid exercise. °SEEK MEDICAL CARE IF:  °· You have a fever. °· You have ketones in your urine, or your blood glucose is higher than a level your health care provider suggests. You may need extra insulin. Call your health care provider if you need advice on adjusting your insulin. °· You cannot drink at least a tablespoon (15 mL) of fluid every 15-20 minutes. °· You have been vomiting for more than 2 hours. °· You have symptoms of DKA: °¨ Fruity smelling breath. °¨ Breathing faster or slower. °¨ Becoming very sleepy. °SEEK IMMEDIATE MEDICAL CARE IF:  °· You have signs of dehydration: °¨ Decreased urination. °¨ Increased thirst. °¨ Dry skin and mouth. °¨ Light-headedness. °· Your blood glucose is very high (as advised by your health care provider) twice in a row. °· You faint. °· You have chest pain or trouble breathing. °· You have a sudden, severe headache. °· You have sudden weakness in one arm or one leg. °· You have sudden trouble speaking or swallowing. °· You   have vomiting or diarrhea that is getting worse after 3 hours. °· You have abdominal pain. °MAKE SURE YOU:  °· Understand these instructions. °· Will watch your condition. °· Will get help right away if you are not doing well or get worse. °Document Released: 01/20/2000 Document Revised: 01/27/2013 Document Reviewed: 07/28/2008 °Exit[employer]® Patient Information ©2015 [employer]  [employer]. This information is not intended to replace advice given to you by your health care provider. Make sure you discuss any questions you have with your health care provider. ° °

## 2014-04-03 LAB — HEMOGLOBIN A1C
Hgb A1c MFr Bld: 13.2 % — ABNORMAL HIGH (ref 4.8–5.6)
Mean Plasma Glucose: 332 mg/dL

## 2014-04-04 NOTE — Progress Notes (Signed)
UR Completed.  [phone]  

## 2014-04-09 DIAGNOSIS — H35373 Puckering of macula, bilateral: Secondary | ICD-10-CM | POA: Diagnosis not present

## 2014-04-09 DIAGNOSIS — Z961 Presence of intraocular lens: Secondary | ICD-10-CM | POA: Diagnosis not present

## 2014-04-09 DIAGNOSIS — H20013 Primary iridocyclitis, bilateral: Secondary | ICD-10-CM | POA: Diagnosis not present

## 2014-04-12 DIAGNOSIS — E1100 Type 2 diabetes mellitus with hyperosmolarity without nonketotic hyperglycemic-hyperosmolar coma (NKHHC): Secondary | ICD-10-CM | POA: Diagnosis not present

## 2014-04-12 DIAGNOSIS — J441 Chronic obstructive pulmonary disease with (acute) exacerbation: Secondary | ICD-10-CM | POA: Diagnosis not present

## 2014-04-12 DIAGNOSIS — K210 Gastro-esophageal reflux disease with esophagitis: Secondary | ICD-10-CM | POA: Diagnosis not present

## 2014-04-22 DIAGNOSIS — J441 Chronic obstructive pulmonary disease with (acute) exacerbation: Secondary | ICD-10-CM | POA: Diagnosis not present

## 2014-04-22 DIAGNOSIS — K210 Gastro-esophageal reflux disease with esophagitis: Secondary | ICD-10-CM | POA: Diagnosis not present

## 2014-04-22 DIAGNOSIS — E1100 Type 2 diabetes mellitus with hyperosmolarity without nonketotic hyperglycemic-hyperosmolar coma (NKHHC): Secondary | ICD-10-CM | POA: Diagnosis not present

## 2014-05-04 DIAGNOSIS — H20013 Primary iridocyclitis, bilateral: Secondary | ICD-10-CM | POA: Diagnosis not present

## 2014-05-13 DIAGNOSIS — E1100 Type 2 diabetes mellitus with hyperosmolarity without nonketotic hyperglycemic-hyperosmolar coma (NKHHC): Secondary | ICD-10-CM | POA: Diagnosis not present

## 2014-05-13 DIAGNOSIS — J441 Chronic obstructive pulmonary disease with (acute) exacerbation: Secondary | ICD-10-CM | POA: Diagnosis not present

## 2014-05-13 DIAGNOSIS — K210 Gastro-esophageal reflux disease with esophagitis: Secondary | ICD-10-CM | POA: Diagnosis not present

## 2014-06-06 ENCOUNTER — Emergency Department (HOSPITAL_COMMUNITY): Payer: Medicare Other

## 2014-06-06 ENCOUNTER — Encounter (HOSPITAL_COMMUNITY): Payer: Self-pay | Admitting: *Deleted

## 2014-06-06 ENCOUNTER — Emergency Department (HOSPITAL_COMMUNITY)
Admission: EM | Admit: 2014-06-06 | Discharge: 2014-06-06 | Disposition: A | Discharge status: Home / Self Care | Payer: Medicare Other | Attending: Emergency Medicine | Admitting: Emergency Medicine

## 2014-06-06 DIAGNOSIS — I10 Essential (primary) hypertension: Secondary | ICD-10-CM | POA: Diagnosis not present

## 2014-06-06 DIAGNOSIS — Z7982 Long term (current) use of aspirin: Secondary | ICD-10-CM | POA: Diagnosis not present

## 2014-06-06 DIAGNOSIS — K219 Gastro-esophageal reflux disease without esophagitis: Secondary | ICD-10-CM | POA: Diagnosis not present

## 2014-06-06 DIAGNOSIS — Z7952 Long term (current) use of systemic steroids: Secondary | ICD-10-CM | POA: Insufficient documentation

## 2014-06-06 DIAGNOSIS — Z792 Long term (current) use of antibiotics: Secondary | ICD-10-CM | POA: Diagnosis not present

## 2014-06-06 DIAGNOSIS — Z79899 Other long term (current) drug therapy: Secondary | ICD-10-CM | POA: Diagnosis not present

## 2014-06-06 DIAGNOSIS — F1310 Sedative, hypnotic or anxiolytic abuse, uncomplicated: Secondary | ICD-10-CM | POA: Diagnosis not present

## 2014-06-06 DIAGNOSIS — I2510 Atherosclerotic heart disease of native coronary artery without angina pectoris: Secondary | ICD-10-CM | POA: Diagnosis not present

## 2014-06-06 DIAGNOSIS — F1410 Cocaine abuse, uncomplicated: Secondary | ICD-10-CM | POA: Insufficient documentation

## 2014-06-06 DIAGNOSIS — R42 Dizziness and giddiness: Secondary | ICD-10-CM | POA: Diagnosis not present

## 2014-06-06 DIAGNOSIS — J45901 Unspecified asthma with (acute) exacerbation: Secondary | ICD-10-CM | POA: Diagnosis not present

## 2014-06-06 DIAGNOSIS — Z87891 Personal history of nicotine dependence: Secondary | ICD-10-CM | POA: Insufficient documentation

## 2014-06-06 DIAGNOSIS — R0602 Shortness of breath: Secondary | ICD-10-CM

## 2014-06-06 DIAGNOSIS — E785 Hyperlipidemia, unspecified: Secondary | ICD-10-CM | POA: Insufficient documentation

## 2014-06-06 DIAGNOSIS — M1990 Unspecified osteoarthritis, unspecified site: Secondary | ICD-10-CM | POA: Insufficient documentation

## 2014-06-06 DIAGNOSIS — E119 Type 2 diabetes mellitus without complications: Secondary | ICD-10-CM | POA: Insufficient documentation

## 2014-06-06 DIAGNOSIS — Z86010 Personal history of colonic polyps: Secondary | ICD-10-CM | POA: Insufficient documentation

## 2014-06-06 DIAGNOSIS — Z9861 Coronary angioplasty status: Secondary | ICD-10-CM | POA: Insufficient documentation

## 2014-06-06 DIAGNOSIS — I252 Old myocardial infarction: Secondary | ICD-10-CM | POA: Diagnosis not present

## 2014-06-06 DIAGNOSIS — Z794 Long term (current) use of insulin: Secondary | ICD-10-CM | POA: Insufficient documentation

## 2014-06-06 DIAGNOSIS — R404 Transient alteration of awareness: Secondary | ICD-10-CM | POA: Diagnosis not present

## 2014-06-06 DIAGNOSIS — Z9889 Other specified postprocedural states: Secondary | ICD-10-CM | POA: Insufficient documentation

## 2014-06-06 DIAGNOSIS — D649 Anemia, unspecified: Secondary | ICD-10-CM | POA: Insufficient documentation

## 2014-06-06 LAB — CBC WITH DIFFERENTIAL/PLATELET
BASOS ABS: 0 10*3/uL (ref 0.0–0.1)
Basophils Relative: 1 % (ref 0–1)
EOS ABS: 0.1 10*3/uL (ref 0.0–0.7)
Eosinophils Relative: 2 % (ref 0–5)
HCT: 43.6 % (ref 36.0–46.0)
Hemoglobin: 13.5 g/dL (ref 12.0–15.0)
Lymphocytes Relative: 35 % (ref 12–46)
Lymphs Abs: 2.3 10*3/uL (ref 0.7–4.0)
MCH: 30.6 pg (ref 26.0–34.0)
MCHC: 31 g/dL (ref 30.0–36.0)
MCV: 98.9 fL (ref 78.0–100.0)
MONOS PCT: 10 % (ref 3–12)
Monocytes Absolute: 0.6 10*3/uL (ref 0.1–1.0)
Neutro Abs: 3.5 10*3/uL (ref 1.7–7.7)
Neutrophils Relative %: 52 % (ref 43–77)
PLATELETS: 289 10*3/uL (ref 150–400)
RBC: 4.41 MIL/uL (ref 3.87–5.11)
RDW: 15.3 % (ref 11.5–15.5)
WBC: 6.6 10*3/uL (ref 4.0–10.5)

## 2014-06-06 LAB — BASIC METABOLIC PANEL
Anion gap: 10 (ref 5–15)
BUN: 9 mg/dL (ref 6–20)
CALCIUM: 9.4 mg/dL (ref 8.9–10.3)
CO2: 24 mmol/L (ref 22–32)
Chloride: 106 mmol/L (ref 101–111)
Creatinine, Ser: 1.28 mg/dL — ABNORMAL HIGH (ref 0.44–1.00)
GFR calc Af Amer: 49 mL/min — ABNORMAL LOW (ref >60)
GFR calc non Af Amer: 43 mL/min — ABNORMAL LOW (ref >60)
GLUCOSE: 111 mg/dL — AB (ref 70–99)
Potassium: 4.3 mmol/L (ref 3.5–5.1)
Sodium: 140 mmol/L (ref 135–145)

## 2014-06-06 LAB — I-STAT TROPONIN, ED
TROPONIN I, POC: 0 ng/mL (ref 0.00–0.08)
Troponin i, poc: 0 ng/mL (ref 0.00–0.08)

## 2014-06-06 LAB — RAPID URINE DRUG SCREEN, HOSP PERFORMED
Amphetamines: NOT DETECTED
BARBITURATES: NOT DETECTED
BENZODIAZEPINES: POSITIVE — AB
COCAINE: POSITIVE — AB
Opiates: NOT DETECTED
TETRAHYDROCANNABINOL: NOT DETECTED

## 2014-06-06 LAB — BRAIN NATRIURETIC PEPTIDE: B Natriuretic Peptide: 19.2 pg/mL (ref 0.0–100.0)

## 2014-06-06 NOTE — ED Notes (Signed)
Pt undressed, in gown, on monitor, continuous pulse oximetry and blood pressure cuff; EKG and vitals performed

## 2014-06-06 NOTE — ED Provider Notes (Signed)
Care assumed from [facility], [place].  [name] is a 64 y.o. female presents with SOB and dizziness x2 days.  CP 2 days ago resolved after nitro; no CP or SOB now.  Hx of CAD.  Last cath in 2013 with patent stents at that time.  Pt also with hx of cocaine abuse but denies usage to.  All symptoms resolved at this time.  No orthostasis.  No clinical evidence of CHF.  Troponin negative and ECG unchanged.  Unlikely ACS.    Pt was discussed with Dr. Terrence Dupont by Suezanne Jacquet with a planned follow-up tomorrow in the office.    Cardiology: Terrence Dupont, MD   Physical Exam  BP 129/72 mmHg  Pulse 83  Temp(Src) 99 F (37.2 C) (Oral)  Resp 26  SpO2 100%  Physical Exam   Face to face Exam:   General: Awake  HEENT: Atraumatic  Resp: Normal effort  Abd: Nondistended  Neuro:No focal weakness  Lymph: No adenopathy    ED Course  Procedures    ICD-9-CM ICD-10-CM   1. Shortness of breath 786.05 R06.02   2. Cocaine abuse 305.60 F14.10     MDM [name] presents with SOB this morning, now resolved.  She is unable to pinpoint a pain.  Pt now reporting that her last cocaine usage was Thursday.  Pt will need delta trop.  All others labs reassuring.  UDS positive for cocaine.    Plan: Awaiting UDS and BNP.  Plan for d/c home with [facility] f/u tomorrow.    7:32 PM Delta trop negative.  Pt to be d/c home with close follow-up tomorrow.    BP 157/101 mmHg  Pulse 80  Temp(Src) 99 F (37.2 C) (Oral)  Resp 22  SpO2 98%   Dominique Butts, PA-C 06/06/14 1933  Dominique Arthurs, MD 06/06/14 2328

## 2014-06-06 NOTE — Discharge Instructions (Signed)
Shortness of Breath Shortness of breath means you have trouble breathing. Shortness of breath needs medical care right away. HOME CARE   Do not smoke.  Avoid being around chemicals or things (paint fumes, dust) that may bother your breathing.  Rest as needed. Slowly begin your normal activities.  Only take medicines as told by your doctor.  Keep all doctor visits as told. GET HELP RIGHT AWAY IF:   Your shortness of breath gets worse.  You feel lightheaded, pass out (faint), or have a cough that is not helped by medicine.  You cough up blood.  You have pain with breathing.  You have pain in your chest, arms, shoulders, or belly (abdomen).  You have a fever.  You cannot walk up stairs or exercise the way you normally do.  You do not get better in the time expected.  You have a hard time doing normal activities even with rest.  You have problems with your medicines.  You have any new symptoms. MAKE SURE YOU:  Understand these instructions.  Will watch your condition.  Will get help right away if you are not doing well or get worse. Document Released: 07/11/2007 Document Revised: 01/27/2013 Document Reviewed: 04/09/2011 [facility] Patient Information 2015 [place], [place]. This information is not intended to replace advice given to you by your health care provider. Make sure you discuss any questions you have with your health care provider.  It is important for you to follow-up with your cardiologist, Dr. Terrence Dupont tomorrow in his office for reevaluation. Return to ED for any new or worsening symptoms.

## 2014-06-06 NOTE — ED Provider Notes (Signed)
CSN: [id number]     Arrival date & time 06/06/14  1404 History   First MD Initiated Contact with Patient 06/06/14 1406     Chief Complaint  Patient presents with  . Shortness of Breath     (Consider location/radiation/quality/duration/timing/severity/associated sxs/prior Treatment) HPI [name] is a 64 y.o. female history of cocaine abuse, previous left heart cath to RCA and circumflex-patent stents, who comes in via EMS for evaluation of dizziness and shortness of breath. Patient is a poor historian. Patient states for the past 2 days she has felt "off balance". She reports this happens when she lowers her head down and when she stands up. The symptoms resolved when she sits down. She also reports shortness of breath at baseline, but worsened today. She tried using an albuterol breathing treatment at home which helped somewhat. Patient also reports associated, fleeting chest pressure 2 days ago in her left chest, took 2 sublingual nitroglycerin and pain resolved. Denies any chest discomfort now. No leg swelling. Cardiologist: Terrence Dupont  Past Medical History  Diagnosis Date  . History of colonic polyps   . GERD (gastroesophageal reflux disease)   . Hyperlipidemia   . Diabetes mellitus without mention of complication   . Hypertension   . Myocardial infarction   . Coronary artery disease   . Fatty liver disease, nonalcoholic   . Cholelithiasis   . Iron deficiency anemia   . Angina   . Shortness of breath   . Asthma   . Arthritis     HANDS"  . Cocaine abuse 07/2012    per [facility] drug screen   Past Surgical History  Procedure Laterality Date  . Right colectomy      After colonic perforation  . Tubal ligation    . Coronary angioplasty with stent placement    . Esophagogastroduodenoscopy  11/30/2011    Procedure: ESOPHAGOGASTRODUODENOSCOPY (EGD);  Surgeon: Lafayette Dragon, MD;  Location: [facility];  Service: Endoscopy;  Laterality: N/A;  . Colonoscopy  11/30/2011    Procedure:  COLONOSCOPY;  Surgeon: Lafayette Dragon, MD;  Location: [facility];  Service: Endoscopy;  Laterality: N/A;  . Left heart catheterization with coronary angiogram N/A 02/20/2011    Procedure: LEFT HEART CATHETERIZATION WITH CORONARY ANGIOGRAM;  Surgeon: Clent Demark, MD;  Location: [facility] CATH LAB;  Service: Cardiovascular;  Laterality: N/A;  . Abdominal angiogram  02/20/2011    Procedure: ABDOMINAL ANGIOGRAM;  Surgeon: Clent Demark, MD;  Location: [facility] CATH LAB;  Service: Cardiovascular;;   Family History  Problem Relation Age of Onset  . Heart disease Mother   . Diabetes Mother   . Colon cancer Neg Hx   . Cancer Father     unsure what kind  . Diabetes Sister    History  Substance Use Topics  . Smoking status: Former Smoker -- 0.50 packs/day for 47 years    Types: Cigarettes    Quit date: 05/09/2010  . Smokeless tobacco: Never Used  . Alcohol Use: No   OB History    No data available     Review of Systems A 10 point review of systems was completed and was negative except for pertinent positives and negatives as mentioned in the history of present illness     Allergies  Review of patient's allergies indicates no known allergies.  Home Medications   Prior to Admission medications   Medication Sig Start Date End Date Taking? Authorizing Provider  acetaminophen (TYLENOL) 500 MG tablet Take 500 mg by mouth  every 6 (six) hours as needed for mild pain.   Yes Historical Provider, MD  albuterol (PROVENTIL) (2.5 MG/3ML) 0.083% nebulizer solution Take 2.5 mg by nebulization 2 (two) times daily.   Yes Historical Provider, MD  ALPRAZolam (XANAX) 0.25 MG tablet Take 0.25 mg by mouth 2 (two) times daily.    Yes Historical Provider, MD  amLODipine (NORVASC) 5 MG tablet Take 5 mg by mouth daily.   Yes Historical Provider, MD  aspirin EC 81 MG EC tablet Take 1 tablet (81 mg total) by mouth daily. 02/06/12  Yes Patrecia Pour, NP  atorvastatin (LIPITOR) 20 MG tablet Take 20 mg by mouth daily.    Yes Historical Provider, MD  budesonide-formoterol (SYMBICORT) 160-4.5 MCG/ACT inhaler Inhale 2 puffs into the lungs 2 (two) times daily.   Yes Historical Provider, MD  diphenoxylate-atropine (LOMOTIL) 2.5-0.025 MG per tablet Take 1-2 tablets by mouth See admin instructions. Take 2 tablets by mouth on onset of diarrhea, repeat in one hour; the take 1 tablet three times daily until diarrhea stops   Yes Historical Provider, MD  HYDROcodone-acetaminophen (NORCO) 10-325 MG per tablet Take 1 tablet by mouth every 4 (four) hours as needed for moderate pain.   Yes Historical Provider, MD  Insulin Glargine (LANTUS SOLOSTAR) 100 UNIT/ML Solostar Pen Inject 40 Units into the skin 2 (two) times daily. 04/02/14  Yes Robbie Lis, MD  Iron-FA-B Cmp-C-Biot-Probiotic (FUSION PLUS PO) Take 1 capsule by mouth daily.   Yes Historical Provider, MD  metFORMIN (GLUCOPHAGE) 1000 MG tablet Take 1,000 mg by mouth 2 (two) times daily with a meal.   Yes Historical Provider, MD  metoprolol succinate (TOPROL-XL) 50 MG 24 hr tablet Take 50 mg by mouth every evening. Take with or immediately following a meal.   Yes Historical Provider, MD  nitroGLYCERIN (NITROSTAT) 0.4 MG SL tablet Place 1 tablet (0.4 mg total) under the tongue every 5 (five) minutes as needed. For chest pain Patient taking differently: Place 0.4 mg under the tongue every 5 (five) minutes as needed for chest pain.  02/06/12  Yes Patrecia Pour, NP  pantoprazole (PROTONIX) 40 MG tablet Take 40 mg by mouth daily.   Yes Historical Provider, MD  pregabalin (LYRICA) 150 MG capsule Take 150 mg by mouth 2 (two) times daily.    Yes Historical Provider, MD  sucralfate (CARAFATE) 1 GM/10ML suspension Take 1 g by mouth 4 (four) times daily -  with meals and at bedtime.   Yes Historical Provider, MD  atropine 1 % ophthalmic solution  05/07/14   Historical Provider, MD  Besifloxacin HCl (BESIVANCE) 0.6 % SUSP Apply 1-2 drops to eye 2 (two) times daily as needed.    Historical  Provider, MD  DUREZOL 0.05 % EMUL  03/02/14   Historical Provider, MD  ketorolac (ACULAR) 0.4 % SOLN Place 1 drop into both eyes 4 (four) times daily.    Historical Provider, MD  moxifloxacin (VIGAMOX) 0.5 % ophthalmic solution Place 1 drop into both eyes 3 (three) times daily.    Historical Provider, MD  prednisoLONE acetate (PRED FORTE) 1 % ophthalmic suspension Place 1 drop into the left eye 4 (four) times daily.    Historical Provider, MD   BP 129/72 mmHg  Pulse 83  Temp(Src) 99 F (37.2 C) (Oral)  Resp 26  SpO2 100% Physical Exam  Constitutional: She is oriented to person, place, and time. She appears well-developed and well-nourished. No distress.  HENT:  Head: Normocephalic and atraumatic.  Mouth/Throat: Oropharynx  is clear and moist.  Eyes: Conjunctivae are normal. Pupils are equal, round, and reactive to light. Right eye exhibits no discharge. Left eye exhibits no discharge. No scleral icterus.  Neck: Normal range of motion. Neck supple. No tracheal deviation present.  Cardiovascular: Normal rate, regular rhythm and normal heart sounds.   Pulmonary/Chest: Effort normal and breath sounds normal. No stridor. No respiratory distress. She has no wheezes. She has no rales.  Abdominal: Soft. There is no tenderness.  Musculoskeletal: She exhibits no edema or tenderness.  Neurological: She is alert and oriented to person, place, and time.  Cranial Nerves II-XII grossly intact  Skin: Skin is warm and dry. No rash noted. She is not diaphoretic.  Psychiatric: She has a normal mood and affect.  Nursing note and vitals reviewed.   ED Course  Procedures (including critical care time) Labs Review Labs Reviewed  BASIC METABOLIC PANEL - Abnormal; Notable for the following:    Glucose, Bld 111 (*)    Creatinine, Ser 1.28 (*)    GFR calc non Af Amer 43 (*)    GFR calc Af Amer 49 (*)    All other components within normal limits  CBC WITH DIFFERENTIAL/PLATELET  BRAIN NATRIURETIC PEPTIDE   URINE RAPID DRUG SCREEN (HOSP PERFORMED)  Randolm Idol, ED    Imaging Review Dg Chest 2 View  06/06/2014   CLINICAL DATA:  Shortness of breath. History of hypertension diabetes. Smoker.  EXAM: CHEST  2 VIEW  COMPARISON:  04/01/2014  FINDINGS: The heart size and mediastinal contours are within normal limits. Both lungs are clear. The visualized skeletal structures are unremarkable.  IMPRESSION: No active cardiopulmonary disease.   Electronically Signed   By: Nolon Nations M.D.   On: 06/06/2014 15:20     EKG Interpretation   Date/Time:  Sunday Jun 06 2014 14:15:06 EDT Ventricular Rate:  91 PR Interval:  139 QRS Duration: 91 QT Interval:  361 QTC Calculation: 444 R Axis:   -9 Text Interpretation:  Sinus rhythm Nonspecific T abnormalities, lateral  leads ST elevation, consider inferior injury since last tracing no  significant change Confirmed by BELFI  MD, MELANIE ([id number]) on 06/06/2014  2:30:51 PM     Meds given in ED:  Medications - No data to display  New Prescriptions   No medications on file   Filed Vitals:   06/06/14 1416 06/06/14 1419 06/06/14 1550  BP: 141/75 141/75 129/72  Pulse: 88 89 83  Temp:  99 F (37.2 C)   TempSrc:  Oral   Resp: 18 24 26   SpO2: 99% 98% 100%    MDM  Vitals stable - WNL -afebrile Pt resting comfortably in ED.   PE--lung exam is benign, no shortness of breath, no tachypnea no peripheral edema. Labwork-essentially noncontributory, pending BNP. EKG unchanged, troponin negative. Imaging-chest x-ray stable without any acute cardiopulmonary pathology  DDX--patient with dizziness with postural change, shortness of breath earlier today that has resolved and chest pain 2 days ago that also resolved with sublingual nitroglycerin. Patient denies any discomfort now and wishes to go home. Low concern for ACS, PE at this time. No evidence of heart failure on exam. Discussed patient presentation with Dr. Terrence Dupont and agrees it is reasonable to  discharge to follow-upwill be able to see patient for close follow-up in office on Monday.   Patient care signed out to [facility], PA-C for follow-up on pending labs. Provided there are no new objective findings patient may be discharged home to follow up with  Dr. Terrence Dupont in his office on Monday. I discussed all relevant lab findings and imaging results with pt and they verbalized understanding. Prior to patient sign out, discussed and reviewed this case with my attending, Dr. Tamera Punt.  Final diagnoses:  Shortness of breath  Cocaine abuse       Comer Locket, PA-C 06/08/14 [place], MD 06/08/14 1504

## 2014-06-06 NOTE — ED Notes (Signed)
Pt tearful on assessment; denies pain; awaiting provider re-evaluation

## 2014-06-06 NOTE — ED Notes (Signed)
Pt states she has been dizzy x 2 days and SOB.  Sats are 100% RA.  A&O x 4.  Pt stated she had an albuteral breathing treatment at home this AM. EMS unable to find a vein, states breathing 50 per minute.

## 2014-06-09 DIAGNOSIS — M7989 Other specified soft tissue disorders: Secondary | ICD-10-CM | POA: Diagnosis not present

## 2014-06-09 DIAGNOSIS — B351 Tinea unguium: Secondary | ICD-10-CM | POA: Diagnosis not present

## 2014-06-09 DIAGNOSIS — M7751 Other enthesopathy of right foot: Secondary | ICD-10-CM | POA: Diagnosis not present

## 2014-06-09 DIAGNOSIS — M792 Neuralgia and neuritis, unspecified: Secondary | ICD-10-CM | POA: Diagnosis not present

## 2014-06-10 DIAGNOSIS — H20013 Primary iridocyclitis, bilateral: Secondary | ICD-10-CM | POA: Diagnosis not present

## 2014-06-10 DIAGNOSIS — Z961 Presence of intraocular lens: Secondary | ICD-10-CM | POA: Diagnosis not present

## 2014-06-15 DIAGNOSIS — I10 Essential (primary) hypertension: Secondary | ICD-10-CM | POA: Diagnosis not present

## 2014-06-15 DIAGNOSIS — Z0000 Encounter for general adult medical examination without abnormal findings: Secondary | ICD-10-CM | POA: Diagnosis not present

## 2014-06-15 DIAGNOSIS — E1100 Type 2 diabetes mellitus with hyperosmolarity without nonketotic hyperglycemic-hyperosmolar coma (NKHHC): Secondary | ICD-10-CM | POA: Diagnosis not present

## 2014-06-15 DIAGNOSIS — E782 Mixed hyperlipidemia: Secondary | ICD-10-CM | POA: Diagnosis not present

## 2014-06-16 DIAGNOSIS — M2012 Hallux valgus (acquired), left foot: Secondary | ICD-10-CM | POA: Diagnosis not present

## 2014-06-16 DIAGNOSIS — M2011 Hallux valgus (acquired), right foot: Secondary | ICD-10-CM | POA: Diagnosis not present

## 2014-06-16 DIAGNOSIS — M7989 Other specified soft tissue disorders: Secondary | ICD-10-CM | POA: Diagnosis not present

## 2014-06-16 DIAGNOSIS — M10079 Idiopathic gout, unspecified ankle and foot: Secondary | ICD-10-CM | POA: Diagnosis not present

## 2014-06-29 DIAGNOSIS — I2510 Atherosclerotic heart disease of native coronary artery without angina pectoris: Secondary | ICD-10-CM | POA: Diagnosis not present

## 2014-06-29 DIAGNOSIS — E119 Type 2 diabetes mellitus without complications: Secondary | ICD-10-CM | POA: Diagnosis not present

## 2014-06-29 DIAGNOSIS — I209 Angina pectoris, unspecified: Secondary | ICD-10-CM | POA: Diagnosis not present

## 2014-06-29 DIAGNOSIS — E1140 Type 2 diabetes mellitus with diabetic neuropathy, unspecified: Secondary | ICD-10-CM | POA: Diagnosis not present

## 2014-06-29 DIAGNOSIS — I10 Essential (primary) hypertension: Secondary | ICD-10-CM | POA: Diagnosis not present

## 2014-06-29 DIAGNOSIS — G609 Hereditary and idiopathic neuropathy, unspecified: Secondary | ICD-10-CM | POA: Diagnosis not present

## 2014-06-30 DIAGNOSIS — M792 Neuralgia and neuritis, unspecified: Secondary | ICD-10-CM | POA: Diagnosis not present

## 2014-07-16 DIAGNOSIS — G5752 Tarsal tunnel syndrome, left lower limb: Secondary | ICD-10-CM | POA: Diagnosis not present

## 2014-07-16 DIAGNOSIS — M722 Plantar fascial fibromatosis: Secondary | ICD-10-CM | POA: Diagnosis not present

## 2014-07-16 DIAGNOSIS — M71572 Other bursitis, not elsewhere classified, left ankle and foot: Secondary | ICD-10-CM | POA: Diagnosis not present

## 2014-07-16 DIAGNOSIS — M792 Neuralgia and neuritis, unspecified: Secondary | ICD-10-CM | POA: Diagnosis not present

## 2014-07-21 DIAGNOSIS — M792 Neuralgia and neuritis, unspecified: Secondary | ICD-10-CM | POA: Diagnosis not present

## 2014-07-21 DIAGNOSIS — M722 Plantar fascial fibromatosis: Secondary | ICD-10-CM | POA: Diagnosis not present

## 2014-07-21 DIAGNOSIS — G5751 Tarsal tunnel syndrome, right lower limb: Secondary | ICD-10-CM | POA: Diagnosis not present

## 2014-07-21 DIAGNOSIS — L03032 Cellulitis of left toe: Secondary | ICD-10-CM | POA: Diagnosis not present

## 2014-07-21 DIAGNOSIS — B351 Tinea unguium: Secondary | ICD-10-CM | POA: Diagnosis not present

## 2014-07-22 DIAGNOSIS — H209 Unspecified iridocyclitis: Secondary | ICD-10-CM | POA: Diagnosis not present

## 2014-07-22 DIAGNOSIS — H20022 Recurrent acute iridocyclitis, left eye: Secondary | ICD-10-CM | POA: Diagnosis not present

## 2014-07-22 DIAGNOSIS — Z961 Presence of intraocular lens: Secondary | ICD-10-CM | POA: Diagnosis not present

## 2014-07-31 IMAGING — CR DG CHEST 2V
2 series · 2 of 2 positions shown · non-contrast
Comparison: 05/06/2011.

CLINICAL DATA: Chest pain and shortness of breath.

CHEST - 2 VIEW

[w chest pa]
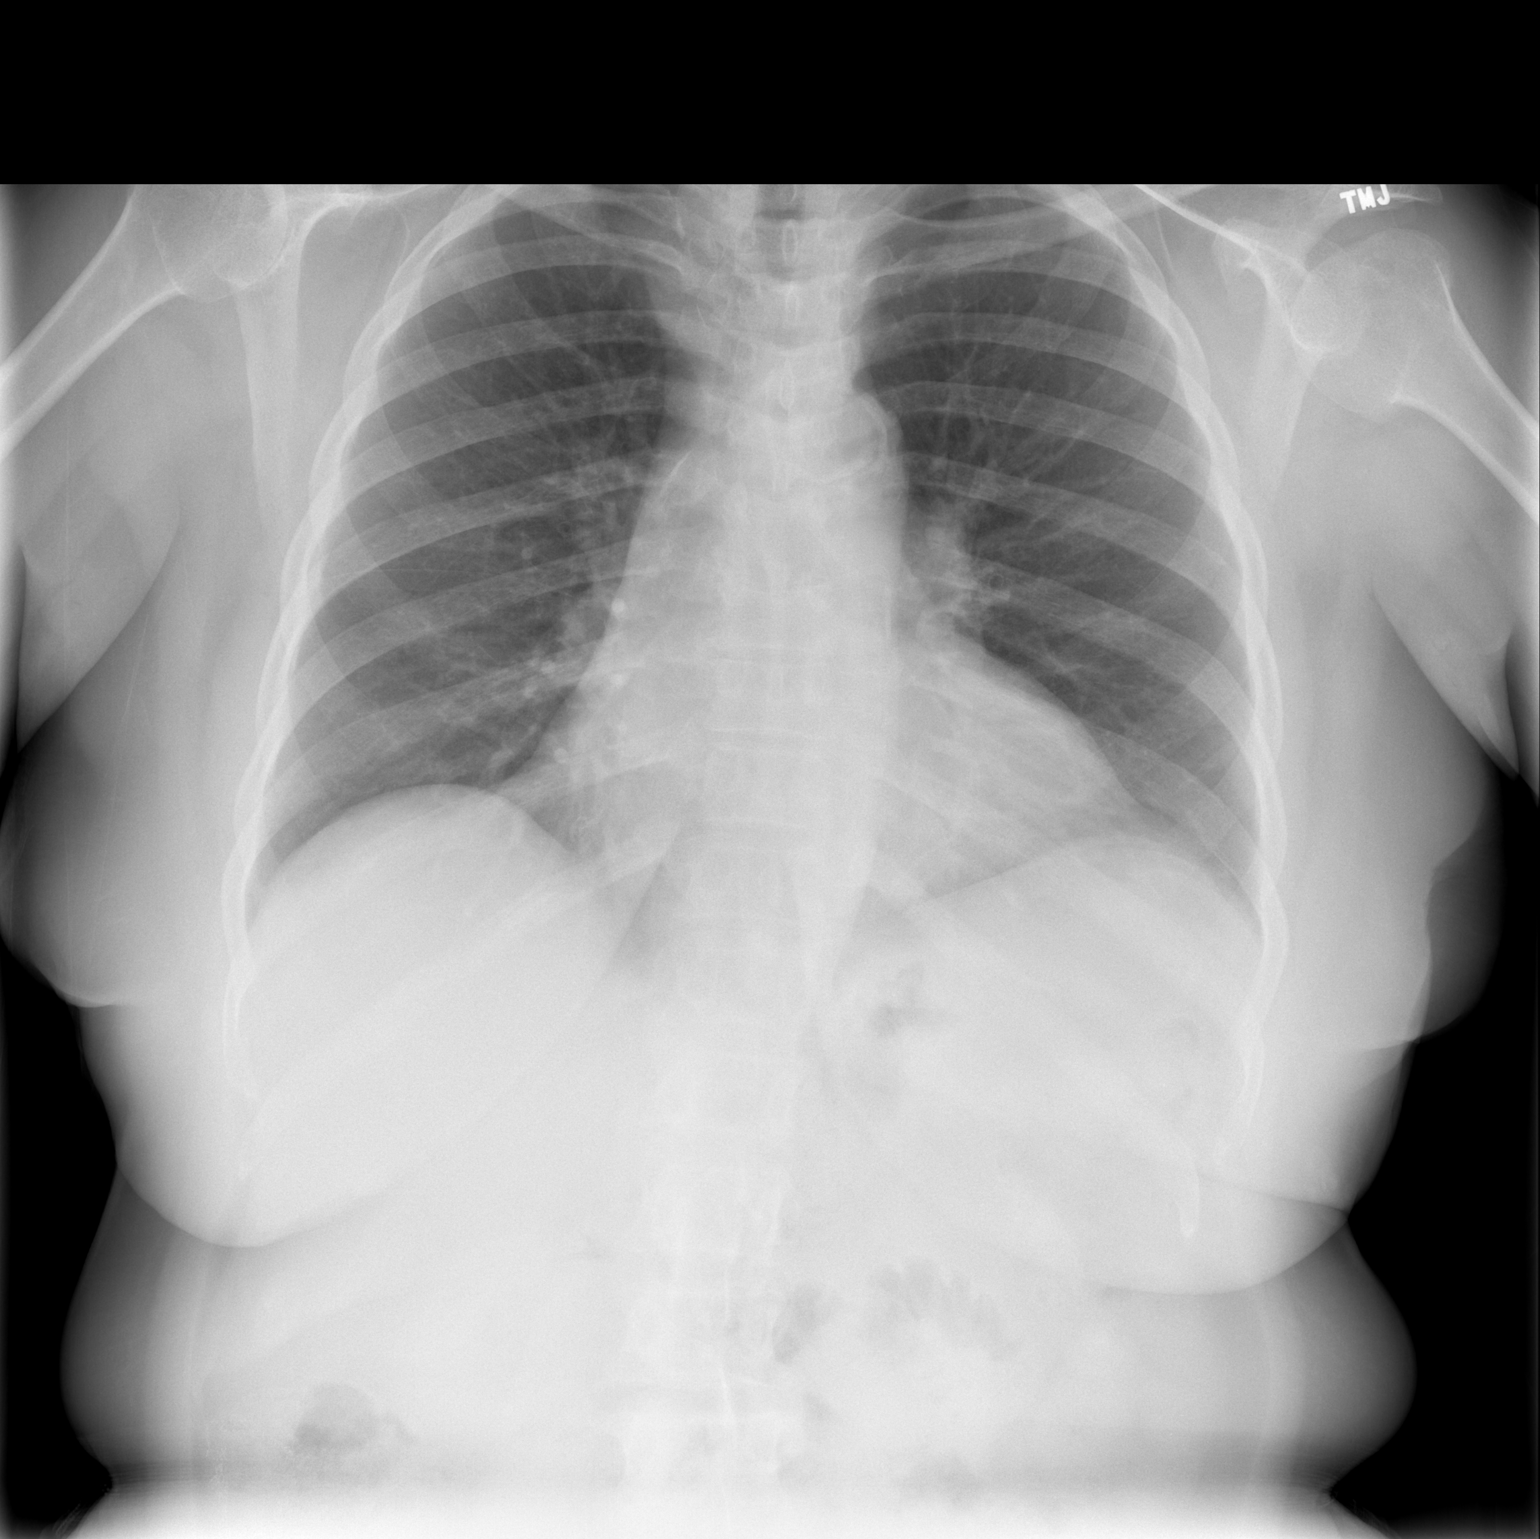

[w chest lat]
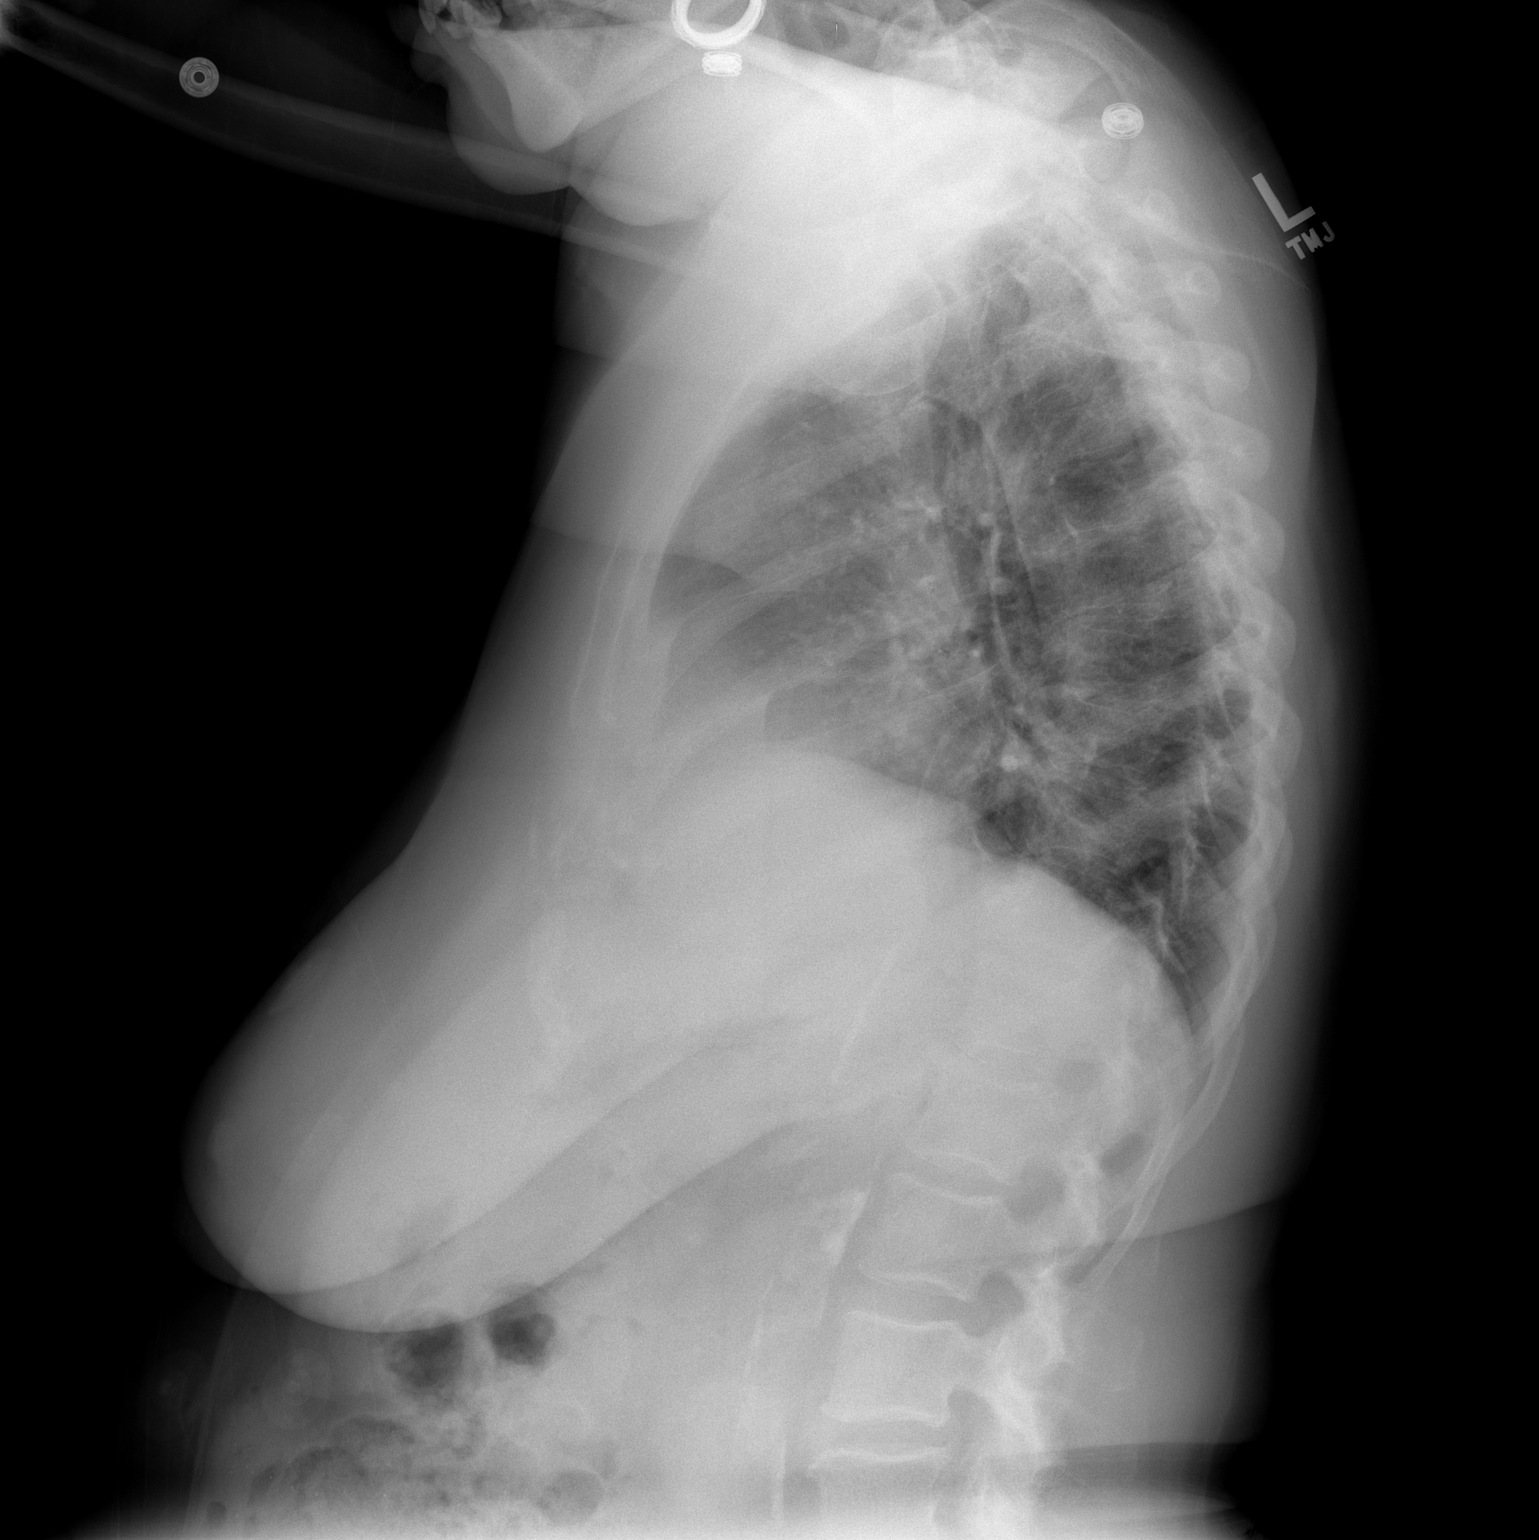

[2 of 2 positions shown; findings below may reference images not displayed]

FINDINGS: The cardiac silhouette, mediastinal and hilar contours
are within normal limits and stable.  There is tortuosity and
calcification of the thoracic aorta.  The lungs are clear.  No
pleural effusion.  Bony thorax is intact.
IMPRESSION: No acute cardiopulmonary findings.

## 2014-08-04 DIAGNOSIS — G5751 Tarsal tunnel syndrome, right lower limb: Secondary | ICD-10-CM | POA: Diagnosis not present

## 2014-08-04 DIAGNOSIS — M7751 Other enthesopathy of right foot: Secondary | ICD-10-CM | POA: Diagnosis not present

## 2014-08-04 DIAGNOSIS — M722 Plantar fascial fibromatosis: Secondary | ICD-10-CM | POA: Diagnosis not present

## 2014-08-18 DIAGNOSIS — B351 Tinea unguium: Secondary | ICD-10-CM | POA: Diagnosis not present

## 2014-08-18 DIAGNOSIS — M792 Neuralgia and neuritis, unspecified: Secondary | ICD-10-CM | POA: Diagnosis not present

## 2014-09-02 DIAGNOSIS — Z961 Presence of intraocular lens: Secondary | ICD-10-CM | POA: Diagnosis not present

## 2014-09-02 DIAGNOSIS — H20023 Recurrent acute iridocyclitis, bilateral: Secondary | ICD-10-CM | POA: Diagnosis not present

## 2014-10-13 IMAGING — CT CT ABD-PELV W/ CM
2 of 5 series · 13 of 36 positions shown, 19 images · IV contrast (OMNIPAQUE)
Comparison: 03/25/2011

CLINICAL DATA: Abdominal pain.  Anemia.  Mild leukocytosis.
Previous right colectomy for perforation.

CT ABDOMEN AND PELVIS WITH CONTRAST
TECHNIQUE: Multidetector CT imaging of the abdomen and pelvis was
performed following the standard protocol during bolus
administration of intravenous contrast.
Contrast: 100mL OMNIPAQUE IOHEXOL 300 MG/ML  SOLN

[Series 2: rtn a/p with · axial · 0.74mm/px · z∈[-420,-44]mm · 12 of 87 slices shown, 17 images]
[im 6/87  soft-tissue]
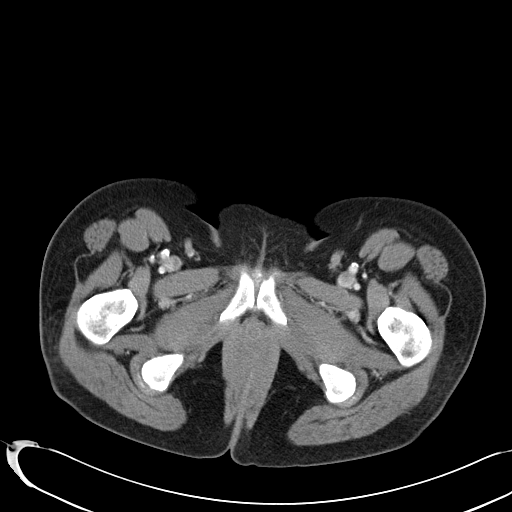
[im 6/87  bone]
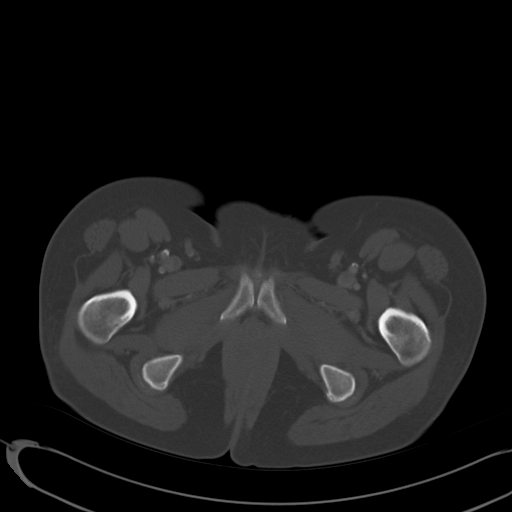
[im 16/87  soft-tissue]
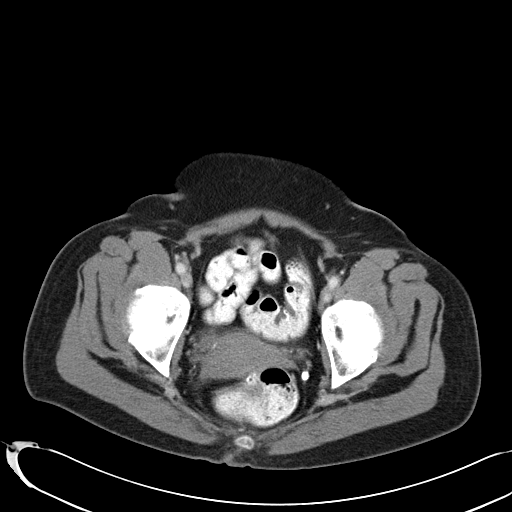
[im 21/87  soft-tissue]
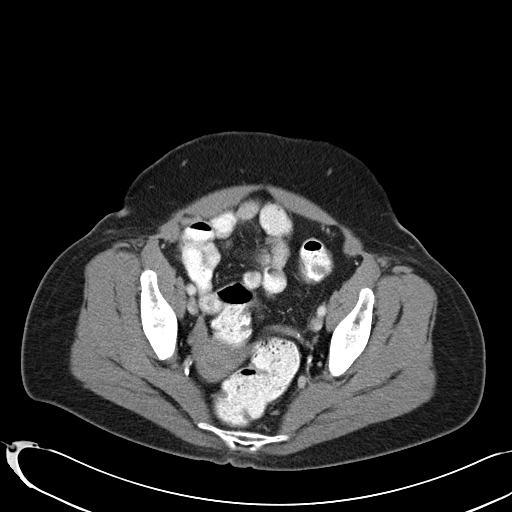
[im 31/87  soft-tissue]
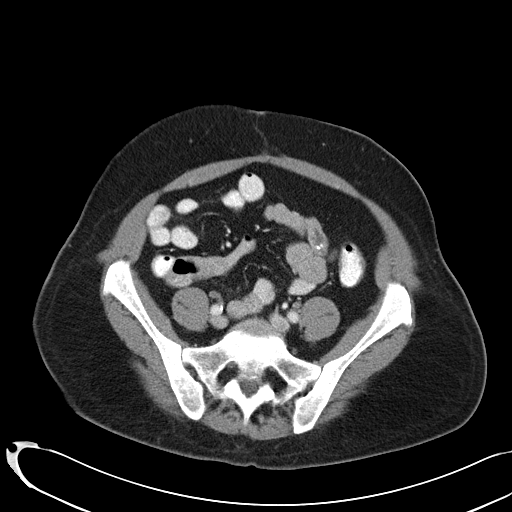
[im 36/87  soft-tissue]
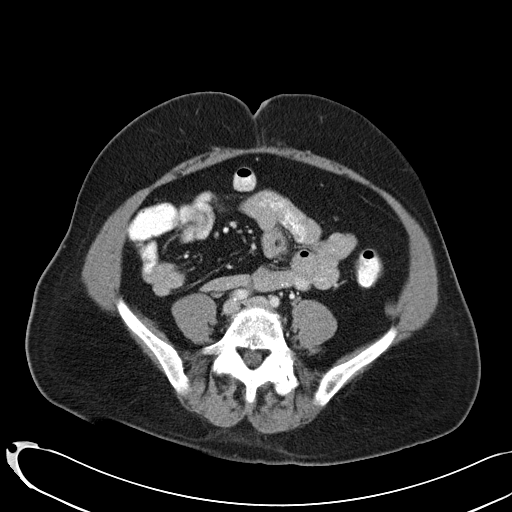
[im 46/87  soft-tissue]
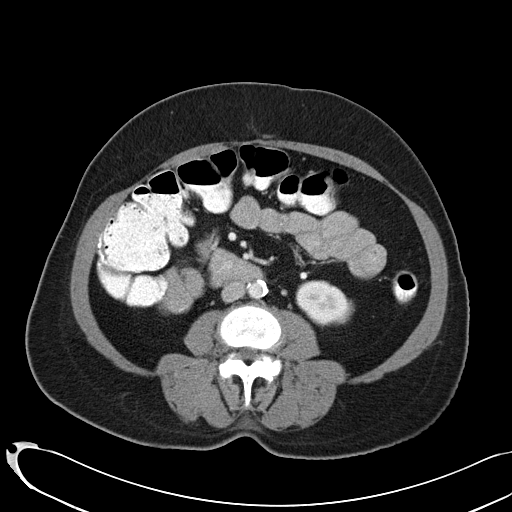
[im 51/87  soft-tissue]
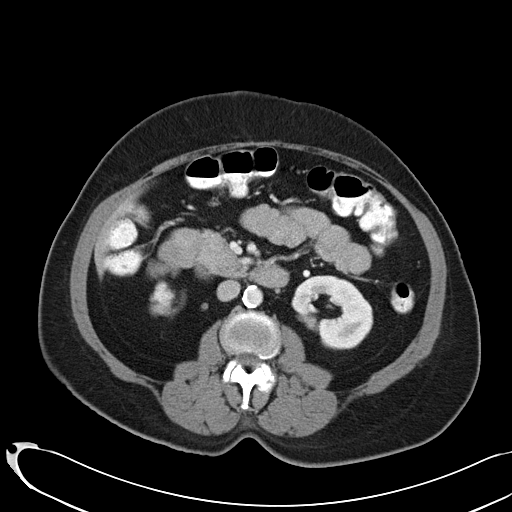
[im 56/87  soft-tissue]
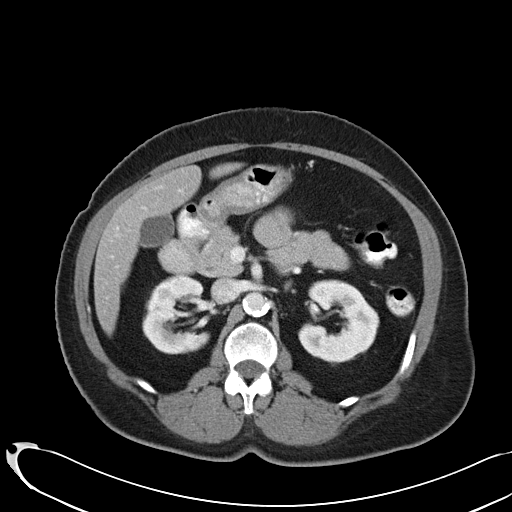
[im 66/87  soft-tissue]
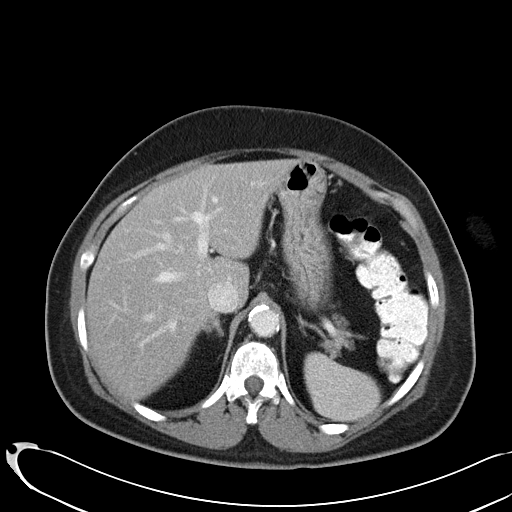
[im 66/87  lung]
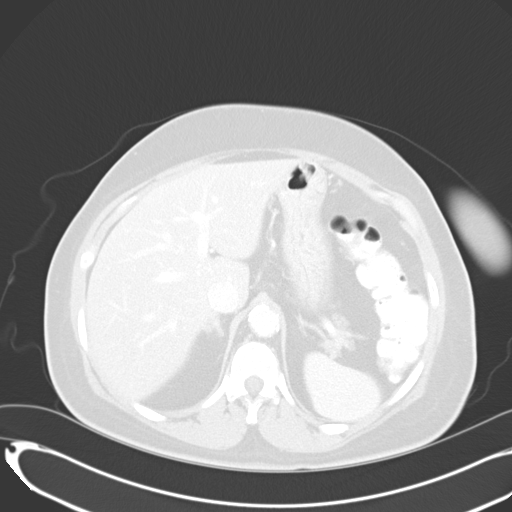
[im 66/87  bone]
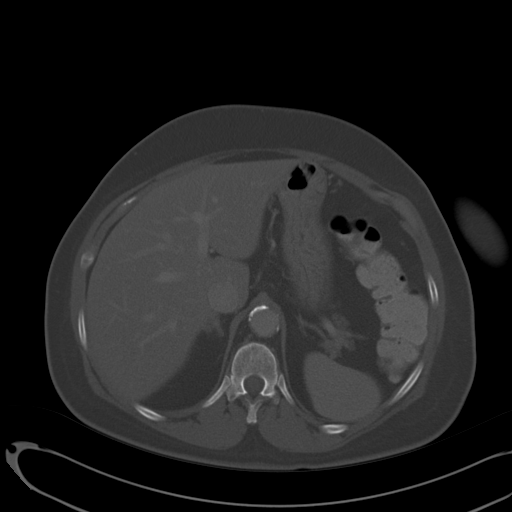
[im 71/87  soft-tissue]
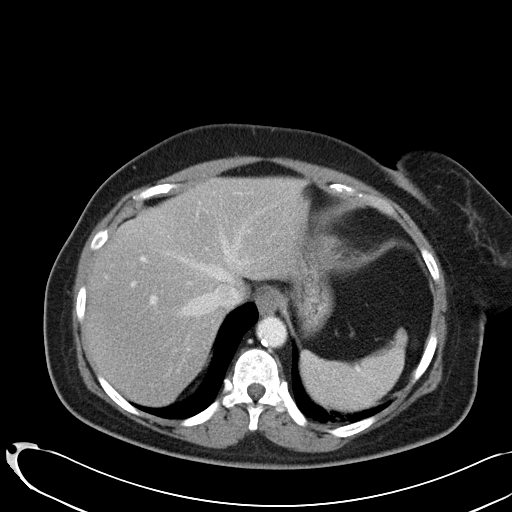
[im 71/87  lung]
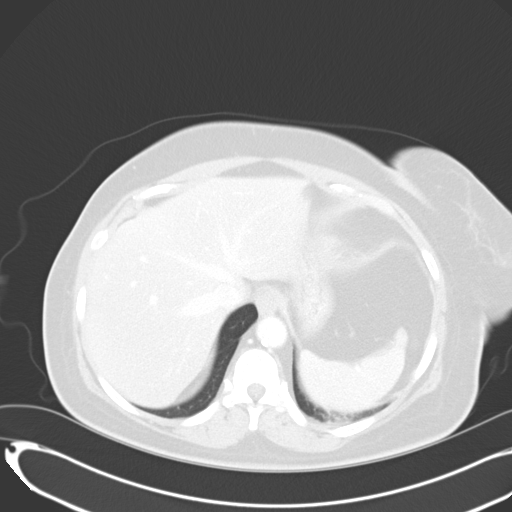
[im 76/87  lung]
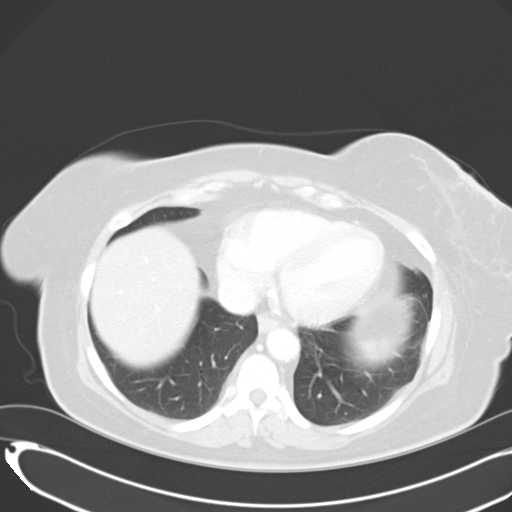
[im 81/87  soft-tissue]
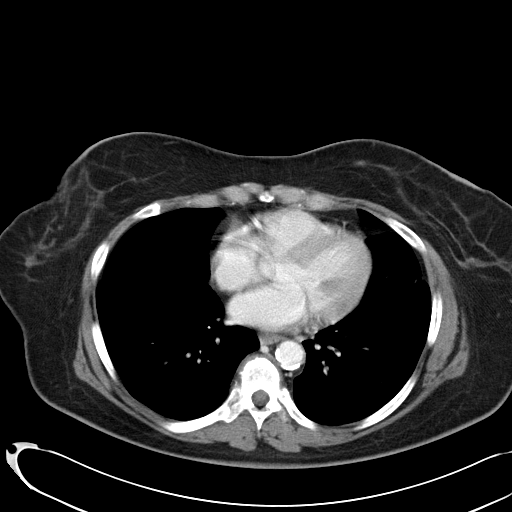
[im 81/87  lung]
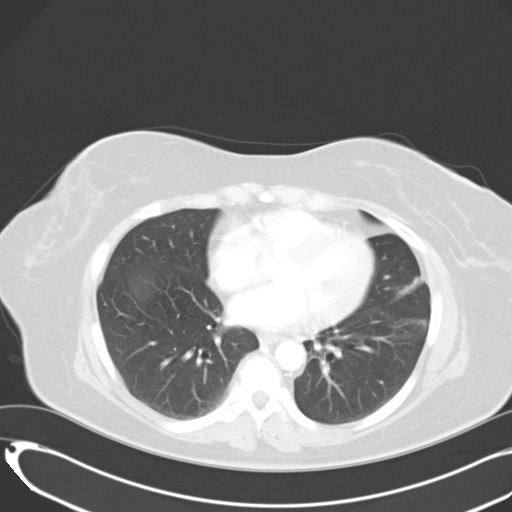

[Series 6: sagittal · sagittal · 0.92mm/px · 1 of 160 slices shown, 2 images]
[im 54/160  soft-tissue]
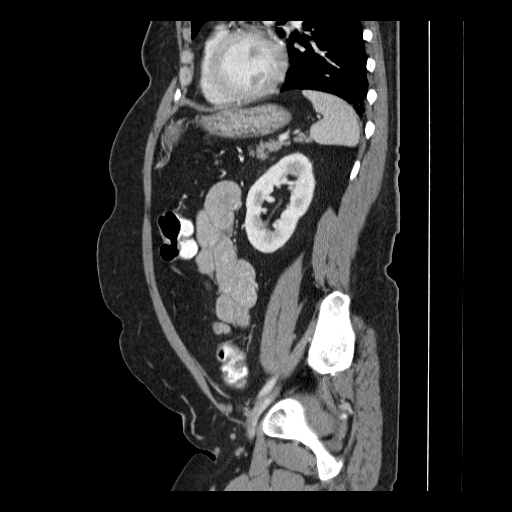
[im 54/160  bone]
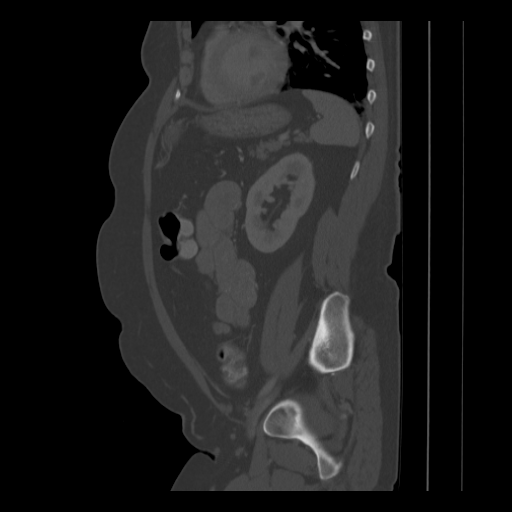

[13 of 36 positions shown; findings below may reference images not displayed]

FINDINGS: Tiny less than 1 cm gallstones again demonstrated,
however there is no evidence of cholecystitis.  Mild hepatic
steatosis again demonstrated, without evidence of liver mass or
biliary dilatation.  The pancreas, spleen, adrenal glands, and left
kidney are normal appearance.  Chronic right renal parenchymal
scarring and atrophy is stable.  No evidence of renal mass or
hydronephrosis.

No soft tissue masses or lymphadenopathy is seen elsewhere within
the abdomen or pelvis.  Uterus and adnexal regions are
unremarkable.  No evidence of inflammatory process or abnormal
fluid collections.  No evidence of bowel wall thickening,
dilatation, or hernia.  No suspicious bone lesions identified.
IMPRESSION: 1.  No acute findings.
2.  Cholelithiasis, without evidence of cholecystitis.
3.  Mild hepatic steatosis.
4.  Chronic right renal parenchymal scarring and atrophy.

## 2014-10-13 IMAGING — CR DG CHEST 1V
1 series · 1 of 1 positions shown · non-contrast
Comparison: Chest radiograph 11/20/2011

CLINICAL DATA: Short of breath

CHEST - 1 VIEW

[w chest pa]
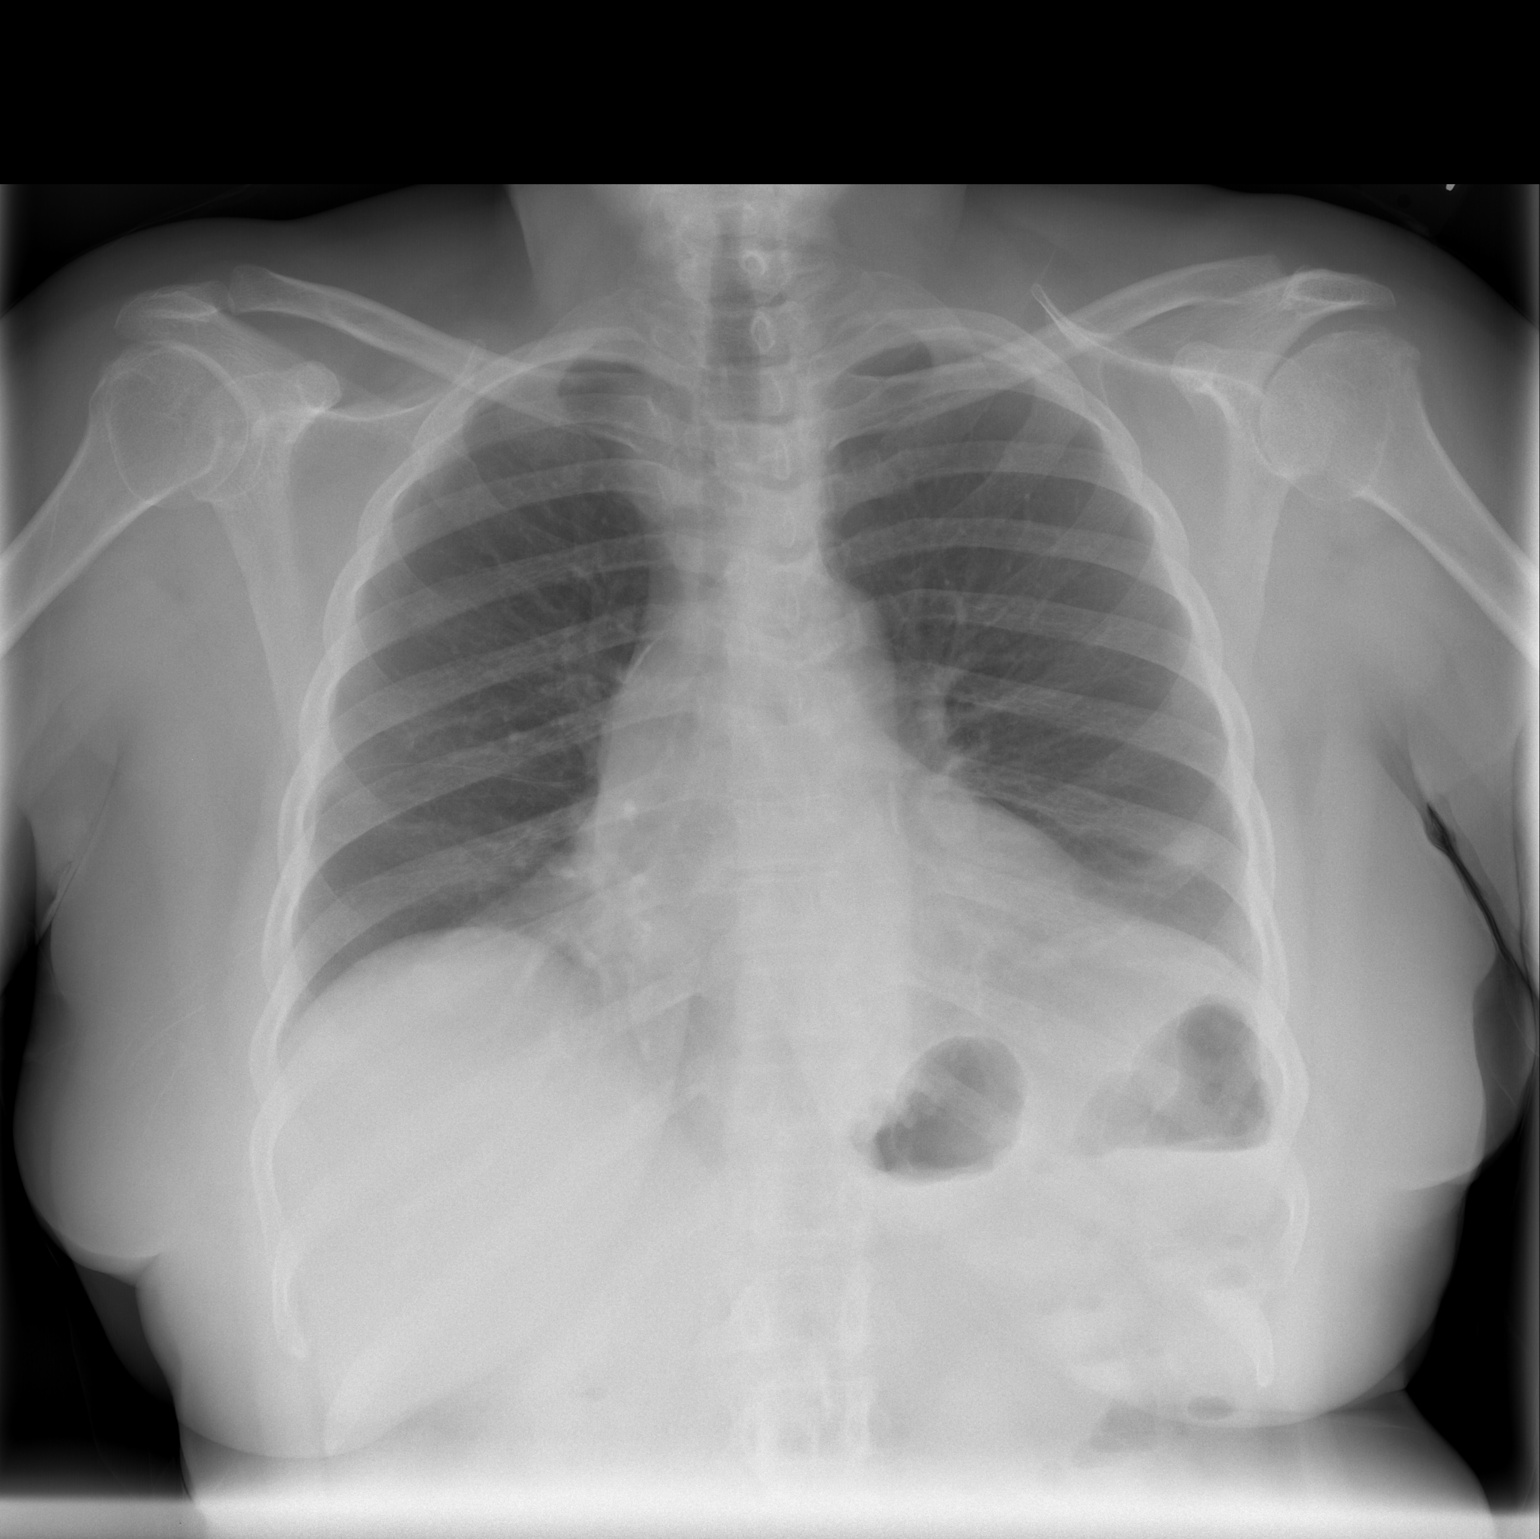

[1 of 1 positions shown; findings below may reference images not displayed]

FINDINGS: Normal mediastinum and heart silhouette.  There are low
lung volumes.  No effusion, infiltrate, or pneumothorax.
IMPRESSION: Low lung volumes.  No acute cardiopulmonary findings.

## 2014-10-14 IMAGING — CR DG CHEST 1V
1 series · 1 of 1 positions shown · non-contrast
Comparison: 02/02/2012

CLINICAL DATA: Chest pain.

CHEST - 1 VIEW

[AP]
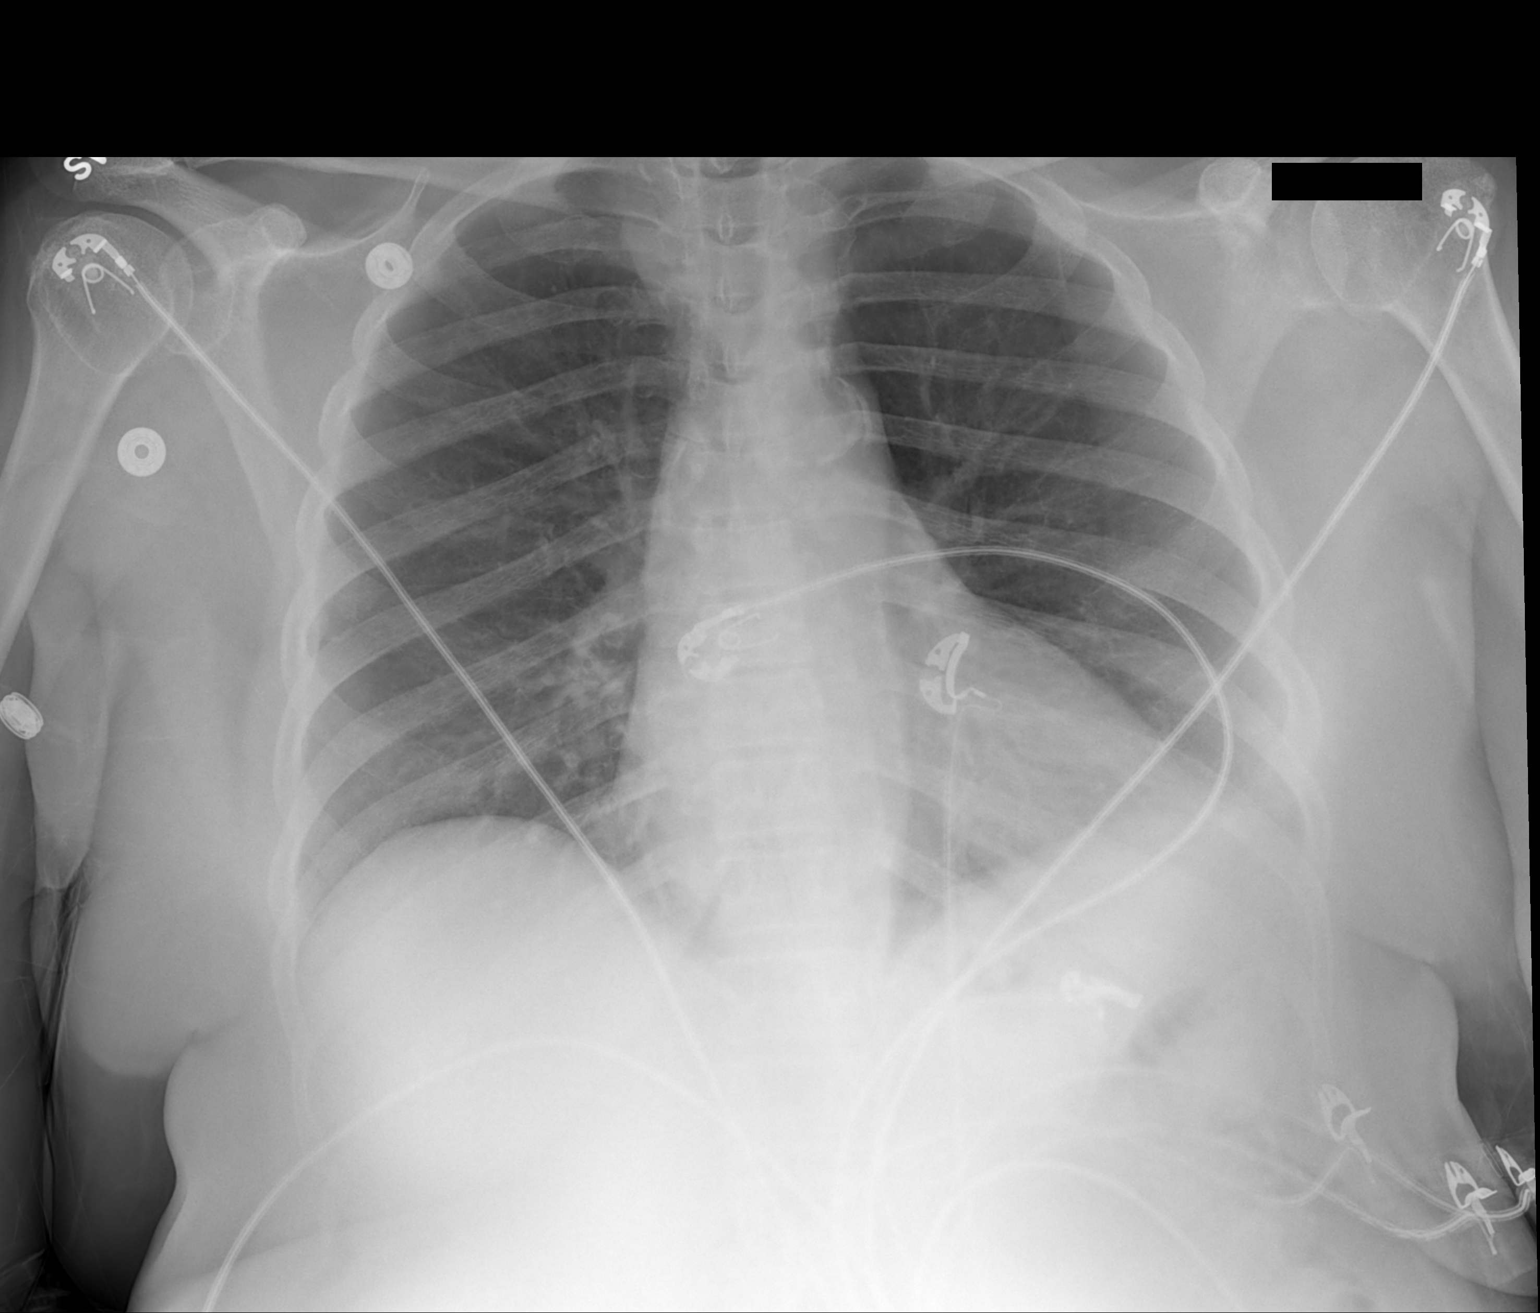

[1 of 1 positions shown; findings below may reference images not displayed]

FINDINGS: Heart and mediastinal contours are within normal limits.
No focal opacities or effusions.  No acute bony abnormality.
IMPRESSION: No active cardiopulmonary disease.

## 2014-11-01 DIAGNOSIS — I10 Essential (primary) hypertension: Secondary | ICD-10-CM | POA: Diagnosis not present

## 2014-11-01 DIAGNOSIS — J441 Chronic obstructive pulmonary disease with (acute) exacerbation: Secondary | ICD-10-CM | POA: Diagnosis not present

## 2014-11-01 DIAGNOSIS — E1100 Type 2 diabetes mellitus with hyperosmolarity without nonketotic hyperglycemic-hyperosmolar coma (NKHHC): Secondary | ICD-10-CM | POA: Diagnosis not present

## 2014-11-01 DIAGNOSIS — E782 Mixed hyperlipidemia: Secondary | ICD-10-CM | POA: Diagnosis not present

## 2014-11-01 DIAGNOSIS — Z23 Encounter for immunization: Secondary | ICD-10-CM | POA: Diagnosis not present

## 2014-11-03 DIAGNOSIS — Z961 Presence of intraocular lens: Secondary | ICD-10-CM | POA: Diagnosis not present

## 2014-11-03 DIAGNOSIS — H20023 Recurrent acute iridocyclitis, bilateral: Secondary | ICD-10-CM | POA: Diagnosis not present

## 2014-11-03 DIAGNOSIS — H04123 Dry eye syndrome of bilateral lacrimal glands: Secondary | ICD-10-CM | POA: Diagnosis not present

## 2014-11-26 DIAGNOSIS — E1051 Type 1 diabetes mellitus with diabetic peripheral angiopathy without gangrene: Secondary | ICD-10-CM | POA: Diagnosis not present

## 2014-11-26 DIAGNOSIS — I739 Peripheral vascular disease, unspecified: Secondary | ICD-10-CM | POA: Diagnosis not present

## 2014-11-26 DIAGNOSIS — L603 Nail dystrophy: Secondary | ICD-10-CM | POA: Diagnosis not present

## 2014-11-26 DIAGNOSIS — M2011 Hallux valgus (acquired), right foot: Secondary | ICD-10-CM | POA: Diagnosis not present

## 2014-11-26 DIAGNOSIS — M792 Neuralgia and neuritis, unspecified: Secondary | ICD-10-CM | POA: Diagnosis not present

## 2014-12-02 DIAGNOSIS — I2510 Atherosclerotic heart disease of native coronary artery without angina pectoris: Secondary | ICD-10-CM | POA: Diagnosis not present

## 2014-12-02 DIAGNOSIS — E1140 Type 2 diabetes mellitus with diabetic neuropathy, unspecified: Secondary | ICD-10-CM | POA: Diagnosis not present

## 2014-12-02 DIAGNOSIS — E119 Type 2 diabetes mellitus without complications: Secondary | ICD-10-CM | POA: Diagnosis not present

## 2014-12-02 DIAGNOSIS — I209 Angina pectoris, unspecified: Secondary | ICD-10-CM | POA: Diagnosis not present

## 2014-12-02 DIAGNOSIS — I10 Essential (primary) hypertension: Secondary | ICD-10-CM | POA: Diagnosis not present

## 2014-12-06 ENCOUNTER — Other Ambulatory Visit: Payer: Self-pay

## 2014-12-06 DIAGNOSIS — Z1231 Encounter for screening mammogram for malignant neoplasm of breast: Secondary | ICD-10-CM

## 2014-12-10 DIAGNOSIS — M7751 Other enthesopathy of right foot: Secondary | ICD-10-CM | POA: Diagnosis not present

## 2014-12-10 DIAGNOSIS — M7989 Other specified soft tissue disorders: Secondary | ICD-10-CM | POA: Diagnosis not present

## 2015-01-11 ENCOUNTER — Telehealth: Payer: Self-pay | Admitting: Internal Medicine

## 2015-01-11 NOTE — Telephone Encounter (Signed)
Spoke with patient and she reports abdominal pain and diarrhea for the last few months. States she saw her PCP and was given Lomotil. Lomotil is not helping. Reports diarrhea 2-3/day. Patient does not have a preference for new GI. She understands to go to ED if pain worses or fever. Scheduled with Alonza Bogus, PA on 01/13/15 at 1:30 PM.

## 2015-01-11 NOTE — Telephone Encounter (Signed)
She can do next available if she has no transportation.

## 2015-01-11 NOTE — Telephone Encounter (Signed)
Left message for patient to return my call.

## 2015-01-13 ENCOUNTER — Ambulatory Visit: Payer: Self-pay | Admitting: Gastroenterology

## 2015-01-13 NOTE — Telephone Encounter (Signed)
Left message for patient to return my call.

## 2015-01-14 ENCOUNTER — Ambulatory Visit
Admission: RE | Admit: 2015-01-14 | Discharge: 2015-01-14 | Disposition: A | Discharge status: Home / Self Care | Payer: Medicare Other | Source: Ambulatory Visit

## 2015-01-14 DIAGNOSIS — Z1231 Encounter for screening mammogram for malignant neoplasm of breast: Secondary | ICD-10-CM

## 2015-01-14 NOTE — Telephone Encounter (Signed)
Left message for patient to return my call.

## 2015-01-21 IMAGING — CR DG CHEST 2V
2 series · 2 of 2 positions shown · non-contrast
Comparison: 02/03/2012 and 11/20/2011.

CLINICAL DATA: Mid chest pain with cough and congestion for 2 days.

CHEST - 2 VIEW

[w chest pa]
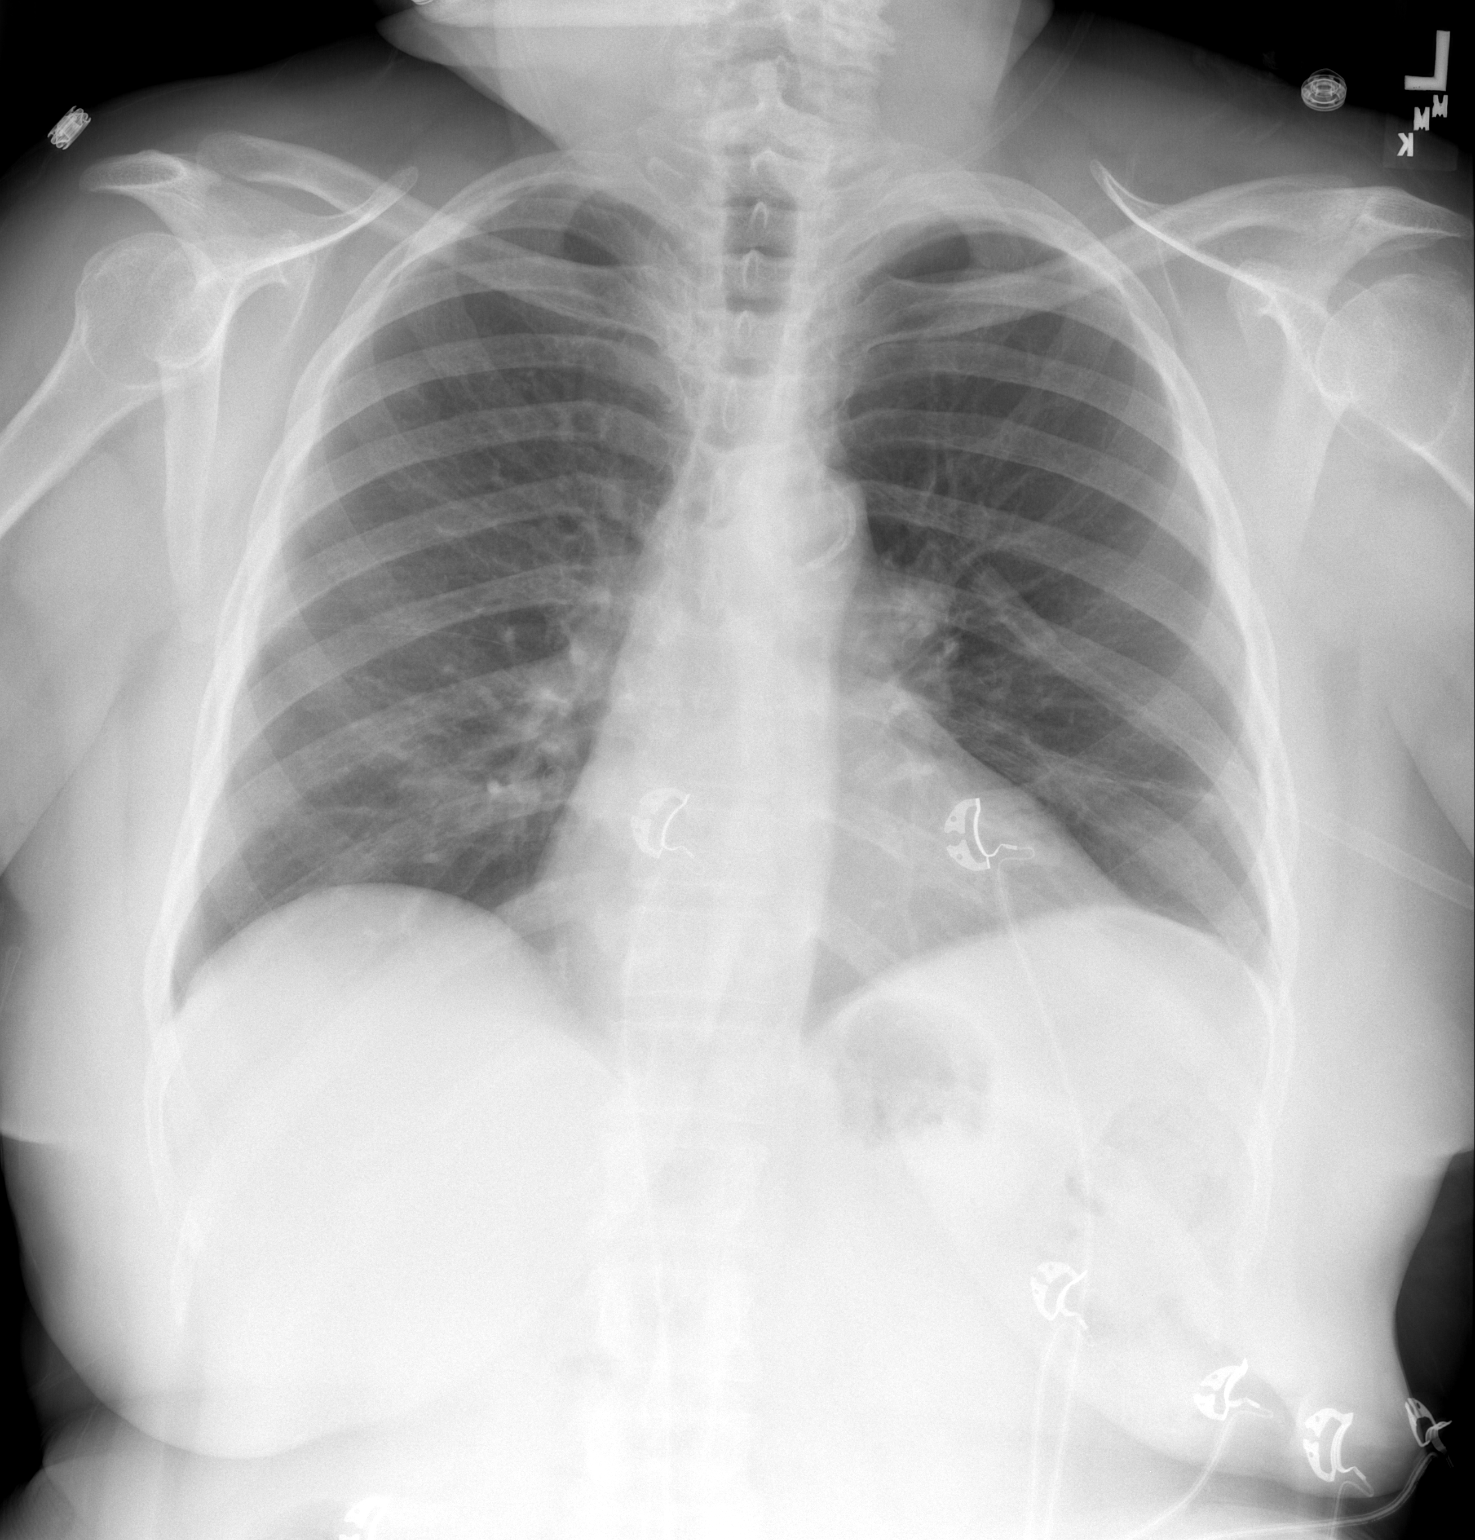

[w chest lat]
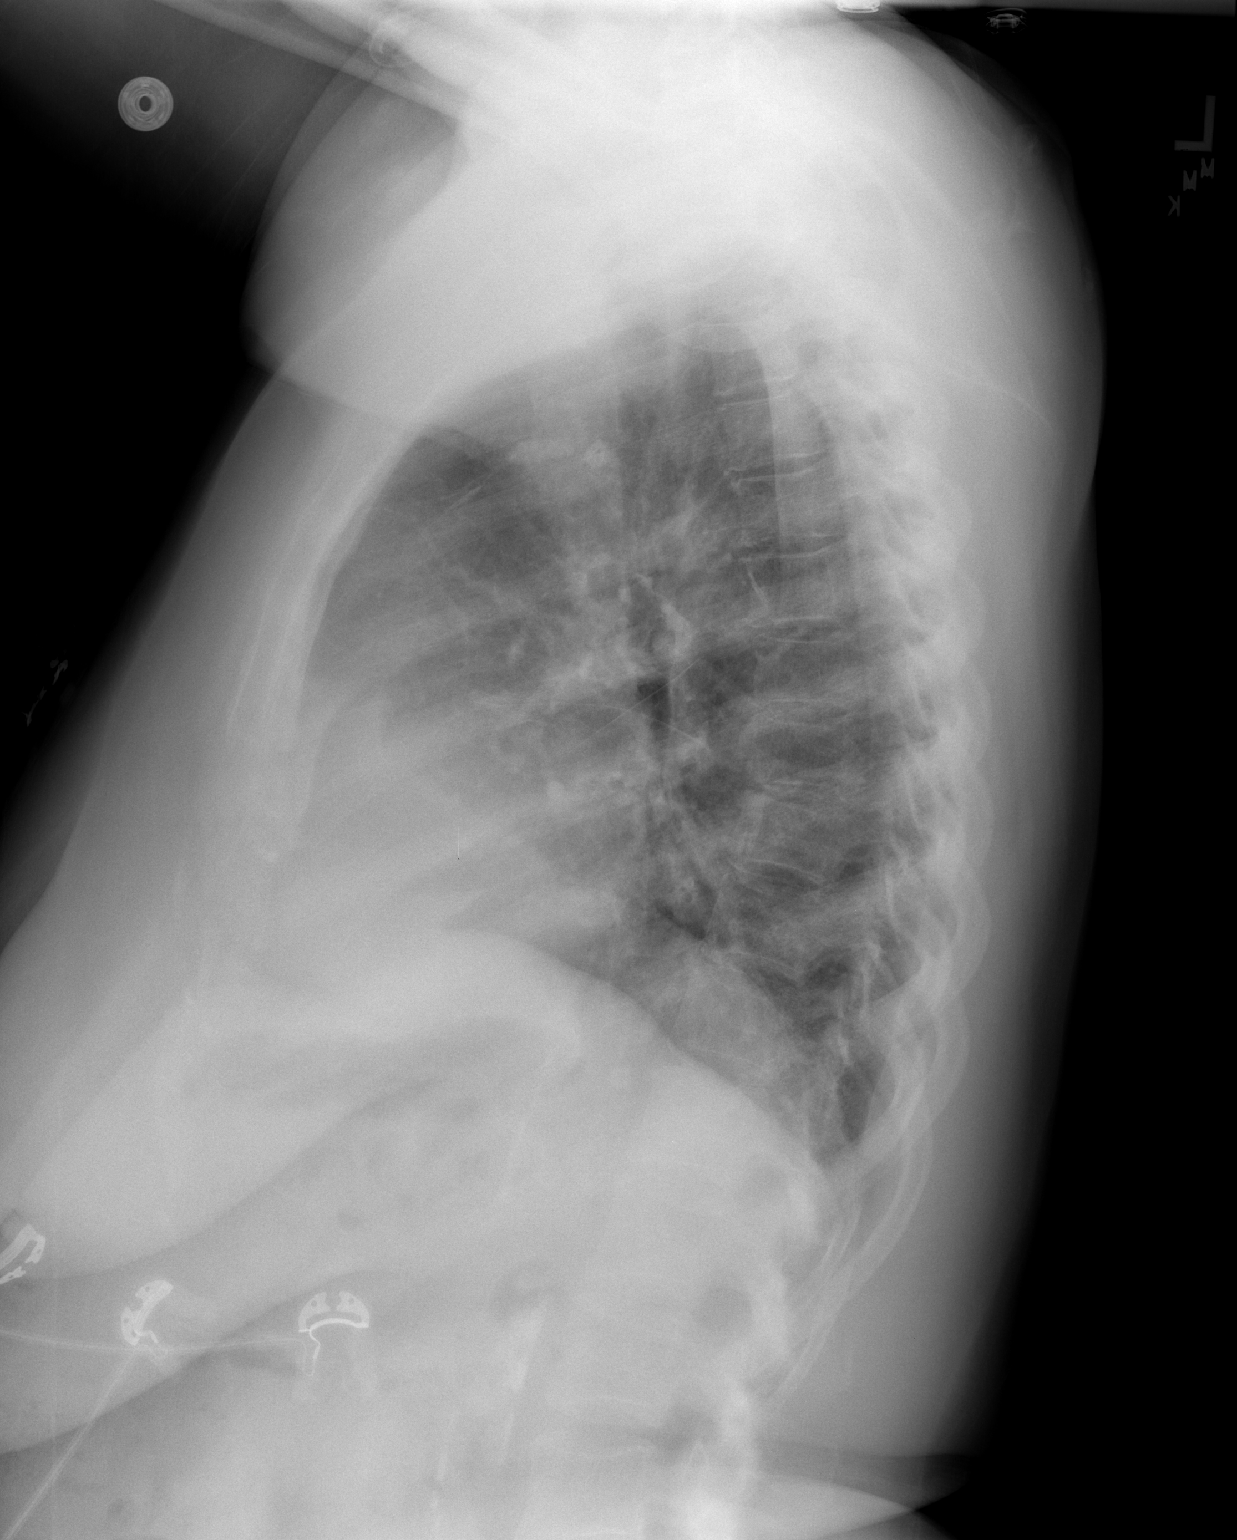

[2 of 2 positions shown; findings below may reference images not displayed]

FINDINGS: The heart size and mediastinal contours are stable.
There is aortic atherosclerosis.  There is mild linear atelectasis
or scarring at the left lung base.  There is no confluent airspace
opacity, edema or pleural effusion. The osseous structures appear
normal.
IMPRESSION: Mild linear left lower lobe atelectasis or scarring.  No acute
cardiopulmonary process.

## 2015-02-18 ENCOUNTER — Encounter (HOSPITAL_COMMUNITY): Payer: Self-pay | Admitting: Emergency Medicine

## 2015-02-18 ENCOUNTER — Emergency Department (INDEPENDENT_AMBULATORY_CARE_PROVIDER_SITE_OTHER)
Admission: EM | Admit: 2015-02-18 | Discharge: 2015-02-18 | Disposition: A | Discharge status: Nursing Home (Includes ICF) | Payer: Medicare Other | Source: Home / Self Care | Attending: Family Medicine | Admitting: Family Medicine

## 2015-02-18 ENCOUNTER — Encounter (HOSPITAL_COMMUNITY): Payer: Self-pay | Admitting: Nurse Practitioner

## 2015-02-18 ENCOUNTER — Inpatient Hospital Stay (HOSPITAL_COMMUNITY)
Admission: EM | Admit: 2015-02-18 | Discharge: 2015-02-24 | DRG: 683 | Disposition: A | Discharge status: Home / Self Care | Payer: Medicare Other | Attending: Internal Medicine | Admitting: Internal Medicine

## 2015-02-18 DIAGNOSIS — K219 Gastro-esophageal reflux disease without esophagitis: Secondary | ICD-10-CM | POA: Diagnosis not present

## 2015-02-18 DIAGNOSIS — I10 Essential (primary) hypertension: Secondary | ICD-10-CM | POA: Diagnosis present

## 2015-02-18 DIAGNOSIS — I2510 Atherosclerotic heart disease of native coronary artery without angina pectoris: Secondary | ICD-10-CM | POA: Diagnosis present

## 2015-02-18 DIAGNOSIS — N183 Chronic kidney disease, stage 3 (moderate): Secondary | ICD-10-CM | POA: Diagnosis present

## 2015-02-18 DIAGNOSIS — I252 Old myocardial infarction: Secondary | ICD-10-CM | POA: Diagnosis not present

## 2015-02-18 DIAGNOSIS — N289 Disorder of kidney and ureter, unspecified: Secondary | ICD-10-CM

## 2015-02-18 DIAGNOSIS — E860 Dehydration: Secondary | ICD-10-CM | POA: Diagnosis not present

## 2015-02-18 DIAGNOSIS — G894 Chronic pain syndrome: Secondary | ICD-10-CM | POA: Diagnosis present

## 2015-02-18 DIAGNOSIS — R112 Nausea with vomiting, unspecified: Secondary | ICD-10-CM

## 2015-02-18 DIAGNOSIS — I129 Hypertensive chronic kidney disease with stage 1 through stage 4 chronic kidney disease, or unspecified chronic kidney disease: Secondary | ICD-10-CM | POA: Diagnosis not present

## 2015-02-18 DIAGNOSIS — K224 Dyskinesia of esophagus: Secondary | ICD-10-CM | POA: Diagnosis not present

## 2015-02-18 DIAGNOSIS — R1084 Generalized abdominal pain: Secondary | ICD-10-CM

## 2015-02-18 DIAGNOSIS — E1122 Type 2 diabetes mellitus with diabetic chronic kidney disease: Secondary | ICD-10-CM | POA: Diagnosis present

## 2015-02-18 DIAGNOSIS — Z7982 Long term (current) use of aspirin: Secondary | ICD-10-CM | POA: Diagnosis not present

## 2015-02-18 DIAGNOSIS — R1013 Epigastric pain: Secondary | ICD-10-CM | POA: Diagnosis not present

## 2015-02-18 DIAGNOSIS — N1330 Unspecified hydronephrosis: Secondary | ICD-10-CM | POA: Diagnosis not present

## 2015-02-18 DIAGNOSIS — Z833 Family history of diabetes mellitus: Secondary | ICD-10-CM | POA: Diagnosis not present

## 2015-02-18 DIAGNOSIS — K760 Fatty (change of) liver, not elsewhere classified: Secondary | ICD-10-CM | POA: Diagnosis present

## 2015-02-18 DIAGNOSIS — Z87891 Personal history of nicotine dependence: Secondary | ICD-10-CM | POA: Diagnosis not present

## 2015-02-18 DIAGNOSIS — R197 Diarrhea, unspecified: Secondary | ICD-10-CM

## 2015-02-18 DIAGNOSIS — R54 Age-related physical debility: Secondary | ICD-10-CM | POA: Diagnosis present

## 2015-02-18 DIAGNOSIS — N189 Chronic kidney disease, unspecified: Secondary | ICD-10-CM

## 2015-02-18 DIAGNOSIS — Z955 Presence of coronary angioplasty implant and graft: Secondary | ICD-10-CM | POA: Diagnosis not present

## 2015-02-18 DIAGNOSIS — Z8249 Family history of ischemic heart disease and other diseases of the circulatory system: Secondary | ICD-10-CM | POA: Diagnosis not present

## 2015-02-18 DIAGNOSIS — E785 Hyperlipidemia, unspecified: Secondary | ICD-10-CM | POA: Diagnosis present

## 2015-02-18 DIAGNOSIS — E870 Hyperosmolality and hypernatremia: Secondary | ICD-10-CM | POA: Diagnosis not present

## 2015-02-18 DIAGNOSIS — R109 Unspecified abdominal pain: Secondary | ICD-10-CM

## 2015-02-18 DIAGNOSIS — E878 Other disorders of electrolyte and fluid balance, not elsewhere classified: Secondary | ICD-10-CM | POA: Diagnosis present

## 2015-02-18 DIAGNOSIS — D509 Iron deficiency anemia, unspecified: Secondary | ICD-10-CM | POA: Diagnosis present

## 2015-02-18 DIAGNOSIS — K589 Irritable bowel syndrome without diarrhea: Secondary | ICD-10-CM | POA: Diagnosis not present

## 2015-02-18 DIAGNOSIS — K8020 Calculus of gallbladder without cholecystitis without obstruction: Secondary | ICD-10-CM | POA: Diagnosis not present

## 2015-02-18 DIAGNOSIS — N179 Acute kidney failure, unspecified: Principal | ICD-10-CM | POA: Diagnosis present

## 2015-02-18 DIAGNOSIS — Z794 Long term (current) use of insulin: Secondary | ICD-10-CM

## 2015-02-18 DIAGNOSIS — Z79899 Other long term (current) drug therapy: Secondary | ICD-10-CM

## 2015-02-18 DIAGNOSIS — J45909 Unspecified asthma, uncomplicated: Secondary | ICD-10-CM | POA: Diagnosis not present

## 2015-02-18 DIAGNOSIS — F419 Anxiety disorder, unspecified: Secondary | ICD-10-CM | POA: Diagnosis present

## 2015-02-18 DIAGNOSIS — E118 Type 2 diabetes mellitus with unspecified complications: Secondary | ICD-10-CM

## 2015-02-18 HISTORY — DX: Dysphagia, unspecified: R13.10

## 2015-02-18 LAB — COMPREHENSIVE METABOLIC PANEL
ALBUMIN: 3.7 g/dL (ref 3.5–5.0)
ALT: 11 U/L — ABNORMAL LOW (ref 14–54)
AST: 16 U/L (ref 15–41)
Alkaline Phosphatase: 81 U/L (ref 38–126)
Anion gap: 12 (ref 5–15)
BILIRUBIN TOTAL: 0.8 mg/dL (ref 0.3–1.2)
BUN: 23 mg/dL — ABNORMAL HIGH (ref 6–20)
CALCIUM: 9.7 mg/dL (ref 8.9–10.3)
CO2: 21 mmol/L — AB (ref 22–32)
CREATININE: 1.67 mg/dL — AB (ref 0.44–1.00)
Chloride: 109 mmol/L (ref 101–111)
GFR calc Af Amer: 36 mL/min — ABNORMAL LOW (ref >60)
GFR calc non Af Amer: 31 mL/min — ABNORMAL LOW (ref >60)
GLUCOSE: 91 mg/dL (ref 65–99)
Potassium: 4.4 mmol/L (ref 3.5–5.1)
Sodium: 142 mmol/L (ref 135–145)
Total Protein: 8.1 g/dL (ref 6.5–8.1)

## 2015-02-18 LAB — URINALYSIS, ROUTINE W REFLEX MICROSCOPIC
Glucose, UA: NEGATIVE mg/dL
HGB URINE DIPSTICK: NEGATIVE
Ketones, ur: 15 mg/dL — AB
Nitrite: NEGATIVE
Protein, ur: 30 mg/dL — AB
SPECIFIC GRAVITY, URINE: 1.025 (ref 1.005–1.030)
pH: 5 (ref 5.0–8.0)

## 2015-02-18 LAB — URINE MICROSCOPIC-ADD ON: RBC / HPF: NONE SEEN RBC/hpf (ref 0–5)

## 2015-02-18 LAB — CBG MONITORING, ED: GLUCOSE-CAPILLARY: 103 mg/dL — AB (ref 65–99)

## 2015-02-18 LAB — CBC
HCT: 42.3 % (ref 36.0–46.0)
HEMOGLOBIN: 13.7 g/dL (ref 12.0–15.0)
MCH: 29 pg (ref 26.0–34.0)
MCHC: 32.4 g/dL (ref 30.0–36.0)
MCV: 89.6 fL (ref 78.0–100.0)
PLATELETS: 332 10*3/uL (ref 150–400)
RBC: 4.72 MIL/uL (ref 3.87–5.11)
RDW: 15.1 % (ref 11.5–15.5)
WBC: 7.3 10*3/uL (ref 4.0–10.5)

## 2015-02-18 MED ORDER — ONDANSETRON HCL 4 MG/2ML IJ SOLN
4.0000 mg | Freq: Once | INTRAMUSCULAR | Status: AC
  Administered 2015-02-18: 4 mg via INTRAVENOUS
  Filled 2015-02-18: qty 2

## 2015-02-18 MED ORDER — FENTANYL CITRATE (PF) 100 MCG/2ML IJ SOLN
50.0000 ug | Freq: Once | INTRAMUSCULAR | Status: AC
  Administered 2015-02-18: 50 ug via INTRAVENOUS
  Filled 2015-02-18: qty 2

## 2015-02-18 MED ORDER — IOHEXOL 300 MG/ML  SOLN
25.0000 mL | INTRAMUSCULAR | Status: AC
Start: 2015-02-18 — End: 2015-02-18
  Administered 2015-02-18 (×2): 25 mL via ORAL

## 2015-02-18 MED ORDER — SODIUM CHLORIDE 0.9 % IV BOLUS (SEPSIS)
1000.0000 mL | Freq: Once | INTRAVENOUS | Status: AC
  Administered 2015-02-18: 1000 mL via INTRAVENOUS

## 2015-02-18 NOTE — ED Notes (Signed)
Bed: UC09 Expected date:  Expected time:  Means of arrival:  Comments: Lice

## 2015-02-18 NOTE — ED Notes (Signed)
  CBG 103  

## 2015-02-18 NOTE — ED Notes (Signed)
She reports several month history of diarrhea, states she has to have a bM every time she eats or drinks anything. She is frustrated because she states she can not leave her house because she has to use the bathroom so much. C/o abd tenderness on palpation. Went to [facility] and was referred here.

## 2015-02-18 NOTE — ED Notes (Signed)
C/o diarrhea.

## 2015-02-18 NOTE — ED Provider Notes (Signed)
CSN: [id number]     Arrival date & time 02/18/15  1705 History   First MD Initiated Contact with Patient 02/18/15 2115     Chief Complaint  Patient presents with  . Diarrhea     (Consider location/radiation/quality/duration/timing/severity/associated sxs/prior Treatment) HPI   Patient with PMH of multiple medical problems. GERD, diabetes, MI, CAD, fatty liver, cholelithiasis, iron deficiency anemia, asthma, SOB, hx of cocaine abuse, hx of surgical abdomen after perforation of colon.  She comes to the ER today complaining of Months of abdominal pain, nausea, and diarrhea. Sent here from the UC. Her pain has worsened and has become severe which is the reason for her presentation. She appears to be very uncomfortable, she has been having bowel movement urgency and has now been wearing depends, fatigued and is now staying in bed. She saw her PCP when it first started and was given Imodium but says she did not help and didn't tell her PCP about it. She has not had any fevers, vomiting, headaches, focal weakness, lower extremity swelling, bleeding that she has noticed.    Past Medical History  Diagnosis Date  . History of colonic polyps   . GERD (gastroesophageal reflux disease)   . Hyperlipidemia   . Diabetes mellitus without mention of complication   . Hypertension   . Myocardial infarction ([place])   . Coronary artery disease   . Fatty liver disease, nonalcoholic   . Cholelithiasis   . Iron deficiency anemia   . Angina   . Shortness of breath   . Asthma   . Arthritis     HANDS"  . Cocaine abuse 07/2012    per [facility]. drug screen  . History of intestinal surgery    Past Surgical History  Procedure Laterality Date  . Right colectomy      After colonic perforation  . Tubal ligation    . Coronary angioplasty with stent placement    . Esophagogastroduodenoscopy  11/30/2011    Procedure: ESOPHAGOGASTRODUODENOSCOPY (EGD);  Surgeon: Lafayette Dragon, MD;  Location: [facility];  Service:  Endoscopy;  Laterality: N/A;  . Colonoscopy  11/30/2011    Procedure: COLONOSCOPY;  Surgeon: Lafayette Dragon, MD;  Location: [facility];  Service: Endoscopy;  Laterality: N/A;  . Left heart catheterization with coronary angiogram N/A 02/20/2011    Procedure: LEFT HEART CATHETERIZATION WITH CORONARY ANGIOGRAM;  Surgeon: Clent Demark, MD;  Location: [facility] CATH LAB;  Service: Cardiovascular;  Laterality: N/A;  . Abdominal angiogram  02/20/2011    Procedure: ABDOMINAL ANGIOGRAM;  Surgeon: Clent Demark, MD;  Location: [facility] CATH LAB;  Service: Cardiovascular;;   Family History  Problem Relation Age of Onset  . Heart disease Mother   . Diabetes Mother   . Colon cancer Neg Hx   . Cancer Father     unsure what kind  . Diabetes Sister    Social History  Substance Use Topics  . Smoking status: Former Smoker -- 0.50 packs/day for 47 years    Types: Cigarettes    Quit date: 05/09/2010  . Smokeless tobacco: Never Used  . Alcohol Use: No   OB History    No data available     Review of Systems  Review of Systems All other systems negative except as documented in the HPI. All pertinent positives and negatives as reviewed in the HPI.   Allergies  Review of patient's allergies indicates no known allergies.  Home Medications   Prior to Admission medications   Medication Sig Start  Date End Date Taking? Authorizing Provider  acetaminophen (TYLENOL) 500 MG tablet Take 500 mg by mouth every 6 (six) hours as needed for mild pain.   Yes Historical Provider, MD  albuterol (PROVENTIL) (2.5 MG/3ML) 0.083% nebulizer solution Take 2.5 mg by nebulization 2 (two) times daily.   Yes Historical Provider, MD  ALPRAZolam (XANAX) 0.25 MG tablet Take 0.25 mg by mouth 2 (two) times daily.    Yes Historical Provider, MD  amLODipine (NORVASC) 5 MG tablet Take 5 mg by mouth daily.   Yes Historical Provider, MD  aspirin EC 81 MG EC tablet Take 1 tablet (81 mg total) by mouth daily. 02/06/12  Yes Patrecia Pour, NP   atorvastatin (LIPITOR) 20 MG tablet Take 20 mg by mouth daily.   Yes Historical Provider, MD  atropine 1 % ophthalmic solution Place 1 drop into both eyes daily.  05/07/14  Yes Historical Provider, MD  Besifloxacin HCl (BESIVANCE) 0.6 % SUSP Apply 1-2 drops to eye 2 (two) times daily as needed. For dry eyes per patient   Yes Historical Provider, MD  budesonide-formoterol (SYMBICORT) 160-4.5 MCG/ACT inhaler Inhale 2 puffs into the lungs 2 (two) times daily.   Yes Historical Provider, MD  DUREZOL 0.05 % EMUL Place 1 drop into both eyes daily. Taking every day per patient 03/02/14  Yes Historical Provider, MD  Insulin Glargine (LANTUS SOLOSTAR) 100 UNIT/ML Solostar Pen Inject 40 Units into the skin 2 (two) times daily. 04/02/14  Yes Robbie Lis, MD  Iron-FA-B Cmp-C-Biot-Probiotic (FUSION PLUS PO) Take 1 capsule by mouth daily.   Yes Historical Provider, MD  ketorolac (ACULAR) 0.4 % SOLN Place 1 drop into both eyes 4 (four) times daily.   Yes Historical Provider, MD  metFORMIN (GLUCOPHAGE) 1000 MG tablet Take 1,000 mg by mouth 2 (two) times daily with a meal.   Yes Historical Provider, MD  metoprolol succinate (TOPROL-XL) 50 MG 24 hr tablet Take 50 mg by mouth every evening. Take with or immediately following a meal.   Yes Historical Provider, MD  moxifloxacin (VIGAMOX) 0.5 % ophthalmic solution Place 1 drop into both eyes 3 (three) times daily.   Yes Historical Provider, MD  nitroGLYCERIN (NITROSTAT) 0.4 MG SL tablet Place 1 tablet (0.4 mg total) under the tongue every 5 (five) minutes as needed. For chest pain Patient taking differently: Place 0.4 mg under the tongue every 5 (five) minutes as needed for chest pain.  02/06/12  Yes Patrecia Pour, NP  pantoprazole (PROTONIX) 40 MG tablet Take 40 mg by mouth daily.   Yes Historical Provider, MD  prednisoLONE acetate (PRED FORTE) 1 % ophthalmic suspension Place 1 drop into the left eye 4 (four) times daily.   Yes Historical Provider, MD  pregabalin (LYRICA)  150 MG capsule Take 150 mg by mouth 2 (two) times daily.    Yes Historical Provider, MD  sucralfate (CARAFATE) 1 GM/10ML suspension Take 1 g by mouth 4 (four) times daily -  with meals and at bedtime.   Yes Historical Provider, MD  diphenoxylate-atropine (LOMOTIL) 2.5-0.025 MG per tablet Take 1-2 tablets by mouth See admin instructions. Take 2 tablets by mouth on onset of diarrhea, repeat in one hour; the take 1 tablet three times daily until diarrhea stops    Historical Provider, MD  HYDROcodone-acetaminophen (NORCO) 10-325 MG per tablet Take 1 tablet by mouth every 4 (four) hours as needed for moderate pain.    Historical Provider, MD   BP 112/59 mmHg  Pulse 59  Temp(Src)  98.5 F (36.9 C) (Oral)  Resp 17  SpO2 95% Physical Exam  Constitutional: She appears well-developed and well-nourished. No distress.  HENT:  Head: Normocephalic and atraumatic.  Right Ear: Tympanic membrane and ear canal normal.  Left Ear: Tympanic membrane and ear canal normal.  Nose: Nose normal.  Mouth/Throat: Uvula is midline, oropharynx is clear and moist and mucous membranes are normal.  Eyes: Pupils are equal, round, and reactive to light.  Neck: Normal range of motion. Neck supple.  Cardiovascular: Normal rate and regular rhythm.   Pulmonary/Chest: Effort normal.  Abdominal: Soft. She exhibits distension. Bowel sounds are increased. There is tenderness (diffuse). There is guarding (voluntary). There is no rebound, no CVA tenderness, no tenderness at McBurney's point and negative Murphy's sign.  No signs of abdominal distention  Musculoskeletal:  No LE swelling  Neurological: She is alert.  Acting at baseline  Skin: Skin is warm and dry. No rash noted.  Nursing note and vitals reviewed.   ED Course  Procedures (including critical care time) Labs Review Labs Reviewed  COMPREHENSIVE METABOLIC PANEL - Abnormal; Notable for the following:    CO2 21 (*)    BUN 23 (*)    Creatinine, Ser 1.67 (*)    ALT  11 (*)    GFR calc non Af Amer 31 (*)    GFR calc Af Amer 36 (*)    All other components within normal limits  URINALYSIS, ROUTINE W REFLEX MICROSCOPIC (NOT AT [facility]) - Abnormal; Notable for the following:    Color, Urine AMBER (*)    APPearance CLOUDY (*)    Bilirubin Urine SMALL (*)    Ketones, ur 15 (*)    Protein, ur 30 (*)    Leukocytes, UA MODERATE (*)    All other components within normal limits  URINE MICROSCOPIC-ADD ON - Abnormal; Notable for the following:    Squamous Epithelial / LPF 6-30 (*)    Bacteria, UA FEW (*)    Casts HYALINE CASTS (*)    All other components within normal limits  CBG MONITORING, ED - Abnormal; Notable for the following:    Glucose-Capillary 103 (*)    All other components within normal limits  CBC    Imaging Review Ct Abdomen Pelvis Wo Contrast  02/19/2015  CLINICAL DATA:  Acute onset of nausea, vomiting and diarrhea. Mid abdominal pain, radiating from the xiphoid process down to the pelvis. Initial encounter. EXAM: CT ABDOMEN AND PELVIS WITHOUT CONTRAST TECHNIQUE: Multidetector CT imaging of the abdomen and pelvis was performed following the standard protocol without IV contrast. COMPARISON:  CT of the abdomen and pelvis performed 03/31/2014 FINDINGS: Minimal bibasilar atelectasis is noted. Diffuse coronary artery calcifications are seen. The liver and spleen are unremarkable in appearance. A stone is noted within the gallbladder. The gallbladder is otherwise unremarkable. The pancreas and adrenal glands are unremarkable. Apparent mild right-sided hydronephrosis is noted, with mild right renal atrophy, but no evidence of distal obstructing stone. This may reflect an underlying stricture, or possibly a recently passed stone. The left kidney is unremarkable in appearance. No renal or ureteral stones are identified. No significant perinephric stranding is seen. No free fluid is identified. The small bowel is unremarkable in appearance. Clumping of bowel at  the upper pelvis is thought to be transient in nature. The stomach is within normal limits. No acute vascular abnormalities are seen. Scattered calcification is noted along the abdominal aorta and its branches. The appendix is not definitely characterized; there is no evidence  of appendicitis. The colon is unremarkable in appearance. The bladder is moderately distended and grossly unremarkable. The uterus is unremarkable in appearance. The ovaries are relatively symmetric. No suspicious adnexal masses are seen. No inguinal lymphadenopathy is seen. No acute osseous abnormalities are identified. Facet disease is noted along the lumbar spine. IMPRESSION: 1. Apparent mild right-sided hydronephrosis, with mild right renal atrophy, but no definite distal obstructing stone. This may reflect an underlying stricture, or possibly a recently passed stone. 2. Scattered calcification along the abdominal aorta and its branches. 3. Cholelithiasis.  Gallbladder otherwise unremarkable. 4. Diffuse coronary artery calcifications seen. Electronically Signed   By: Garald Balding M.D.   On: 02/19/2015 02:38   I have personally reviewed and evaluated these images and lab results as part of my medical decision-making.   EKG Interpretation None      MDM   Final diagnoses:  Diarrhea, unspecified type  Renal insufficiency    CT abd pelvis shows that the patient has potentially passed a kidney stone. She is showing signs of dehydration and renal insufficiency.   Glucose is WNL. pts pain is under control with IV medications. She has needed to use restroom since being in ED. Discussed case with Dr. Carles Collet regarding admission, he will see patient and do admission orders. Patient updated on plan.  Filed Vitals:   02/19/15 0100 02/19/15 0145  BP: 142/73 112/59  Pulse: 62 59  Temp:    Resp: 15 [address], PA-C 02/19/15 [id number]  Merrily Pew, MD 02/22/15 [phone]

## 2015-02-18 NOTE — ED Notes (Signed)
Patient drinking first bottle of IV contrast for upcoming CT.

## 2015-02-18 NOTE — ED Provider Notes (Signed)
CSN: [id number]     Arrival date & time 02/18/15  1459 History   First MD Initiated Contact with Patient 02/18/15 1642     Chief Complaint  Patient presents with  . Diarrhea   (Consider location/radiation/quality/duration/timing/severity/associated sxs/prior Treatment) HPI Abdominal pain for several months. Has not seen PCP or f/u with GI.  Now has diarrhea. States she is in severe pain and wants help. Previous surgery on abdomen patient is unsure. Denies vomiting.  Past Medical History  Diagnosis Date  . History of colonic polyps   . GERD (gastroesophageal reflux disease)   . Hyperlipidemia   . Diabetes mellitus without mention of complication   . Hypertension   . Myocardial infarction ([place])   . Coronary artery disease   . Fatty liver disease, nonalcoholic   . Cholelithiasis   . Iron deficiency anemia   . Angina   . Shortness of breath   . Asthma   . Arthritis     HANDS"  . Cocaine abuse 07/2012    per E.R. drug screen  . History of intestinal surgery    Past Surgical History  Procedure Laterality Date  . Right colectomy      After colonic perforation  . Tubal ligation    . Coronary angioplasty with stent placement    . Esophagogastroduodenoscopy  11/30/2011    Procedure: ESOPHAGOGASTRODUODENOSCOPY (EGD);  Surgeon: Lafayette Dragon, MD;  Location: [facility];  Service: Endoscopy;  Laterality: N/A;  . Colonoscopy  11/30/2011    Procedure: COLONOSCOPY;  Surgeon: Lafayette Dragon, MD;  Location: [facility];  Service: Endoscopy;  Laterality: N/A;  . Left heart catheterization with coronary angiogram N/A 02/20/2011    Procedure: LEFT HEART CATHETERIZATION WITH CORONARY ANGIOGRAM;  Surgeon: Clent Demark, MD;  Location: [facility] CATH LAB;  Service: Cardiovascular;  Laterality: N/A;  . Abdominal angiogram  02/20/2011    Procedure: ABDOMINAL ANGIOGRAM;  Surgeon: Clent Demark, MD;  Location: [facility] CATH LAB;  Service: Cardiovascular;;   Family History  Problem Relation Age of Onset  .  Heart disease Mother   . Diabetes Mother   . Colon cancer Neg Hx   . Cancer Father     unsure what kind  . Diabetes Sister    Social History  Substance Use Topics  . Smoking status: Former Smoker -- 0.50 packs/day for 47 years    Types: Cigarettes    Quit date: 05/09/2010  . Smokeless tobacco: Never Used  . Alcohol Use: No   OB History    No data available     Review of Systems ROS +'ve Abdominal pain  Denies: HEADACHE, NAUSEA,  CHEST PAIN, CONGESTION, DYSURIA, SHORTNESS OF BREATH  Allergies  Review of patient's allergies indicates no known allergies.  Home Medications   Prior to Admission medications   Medication Sig Start Date End Date Taking? Authorizing Provider  acetaminophen (TYLENOL) 500 MG tablet Take 500 mg by mouth every 6 (six) hours as needed for mild pain.    Historical Provider, MD  albuterol (PROVENTIL) (2.5 MG/3ML) 0.083% nebulizer solution Take 2.5 mg by nebulization 2 (two) times daily.    Historical Provider, MD  ALPRAZolam Duanne Moron) 0.25 MG tablet Take 0.25 mg by mouth 2 (two) times daily.     Historical Provider, MD  amLODipine (NORVASC) 5 MG tablet Take 5 mg by mouth daily.    Historical Provider, MD  aspirin EC 81 MG EC tablet Take 1 tablet (81 mg total) by mouth daily. 02/06/12   Asa Saunas  Lord, NP  atorvastatin (LIPITOR) 20 MG tablet Take 20 mg by mouth daily.    Historical Provider, MD  atropine 1 % ophthalmic solution  05/07/14   Historical Provider, MD  Besifloxacin HCl (BESIVANCE) 0.6 % SUSP Apply 1-2 drops to eye 2 (two) times daily as needed.    Historical Provider, MD  budesonide-formoterol (SYMBICORT) 160-4.5 MCG/ACT inhaler Inhale 2 puffs into the lungs 2 (two) times daily.    Historical Provider, MD  diphenoxylate-atropine (LOMOTIL) 2.5-0.025 MG per tablet Take 1-2 tablets by mouth See admin instructions. Take 2 tablets by mouth on onset of diarrhea, repeat in one hour; the take 1 tablet three times daily until diarrhea stops    Historical  Provider, MD  DUREZOL 0.05 % EMUL  03/02/14   Historical Provider, MD  HYDROcodone-acetaminophen (NORCO) 10-325 MG per tablet Take 1 tablet by mouth every 4 (four) hours as needed for moderate pain.    Historical Provider, MD  Insulin Glargine (LANTUS SOLOSTAR) 100 UNIT/ML Solostar Pen Inject 40 Units into the skin 2 (two) times daily. 04/02/14   Robbie Lis, MD  Iron-FA-B Cmp-C-Biot-Probiotic (FUSION PLUS PO) Take 1 capsule by mouth daily.    Historical Provider, MD  ketorolac (ACULAR) 0.4 % SOLN Place 1 drop into both eyes 4 (four) times daily.    Historical Provider, MD  metFORMIN (GLUCOPHAGE) 1000 MG tablet Take 1,000 mg by mouth 2 (two) times daily with a meal.    Historical Provider, MD  metoprolol succinate (TOPROL-XL) 50 MG 24 hr tablet Take 50 mg by mouth every evening. Take with or immediately following a meal.    Historical Provider, MD  moxifloxacin (VIGAMOX) 0.5 % ophthalmic solution Place 1 drop into both eyes 3 (three) times daily.    Historical Provider, MD  nitroGLYCERIN (NITROSTAT) 0.4 MG SL tablet Place 1 tablet (0.4 mg total) under the tongue every 5 (five) minutes as needed. For chest pain Patient taking differently: Place 0.4 mg under the tongue every 5 (five) minutes as needed for chest pain.  02/06/12   Patrecia Pour, NP  pantoprazole (PROTONIX) 40 MG tablet Take 40 mg by mouth daily.    Historical Provider, MD  prednisoLONE acetate (PRED FORTE) 1 % ophthalmic suspension Place 1 drop into the left eye 4 (four) times daily.    Historical Provider, MD  pregabalin (LYRICA) 150 MG capsule Take 150 mg by mouth 2 (two) times daily.     Historical Provider, MD  sucralfate (CARAFATE) 1 GM/10ML suspension Take 1 g by mouth 4 (four) times daily -  with meals and at bedtime.    Historical Provider, MD   Meds Ordered and Administered this Visit  Medications - No data to display  BP 142/80 mmHg  Pulse 76  Temp(Src) 98 F (36.7 C) (Oral)  Resp 16  SpO2 100% No data  found.   Physical Exam  Constitutional: She appears well-developed and well-nourished. She appears distressed.  Pulmonary/Chest: Effort normal and breath sounds normal.  Abdominal: She exhibits distension. There is tenderness. There is no rebound and no guarding.  Skin: Skin is warm and dry.  Psychiatric: She has a normal mood and affect. Her behavior is normal. Judgment and thought content normal.  Nursing note and vitals reviewed.   ED Course  Procedures (including critical care time)  Labs Review Labs Reviewed - No data to display  Imaging Review No results found.   Visual Acuity Review  Right Eye Distance:   Left Eye Distance:   Bilateral  Distance:    Right Eye Near:   Left Eye Near:    Bilateral Near:         MDM  No diagnosis found. p is advised that she should be seen in the ER. Discussed case with Dr. Fuller Plan and he agreed to see patient in office if she would make and keep an appointment.  Pt is sent to ER for evaluation    Konrad Felix, [place] 02/18/15 1858

## 2015-02-19 ENCOUNTER — Emergency Department (HOSPITAL_COMMUNITY): Payer: Medicare Other

## 2015-02-19 DIAGNOSIS — N179 Acute kidney failure, unspecified: Secondary | ICD-10-CM | POA: Diagnosis present

## 2015-02-19 DIAGNOSIS — Z87891 Personal history of nicotine dependence: Secondary | ICD-10-CM | POA: Diagnosis not present

## 2015-02-19 DIAGNOSIS — I10 Essential (primary) hypertension: Secondary | ICD-10-CM | POA: Diagnosis not present

## 2015-02-19 DIAGNOSIS — R197 Diarrhea, unspecified: Secondary | ICD-10-CM | POA: Diagnosis not present

## 2015-02-19 DIAGNOSIS — K760 Fatty (change of) liver, not elsewhere classified: Secondary | ICD-10-CM | POA: Diagnosis present

## 2015-02-19 DIAGNOSIS — R109 Unspecified abdominal pain: Secondary | ICD-10-CM | POA: Diagnosis not present

## 2015-02-19 DIAGNOSIS — K224 Dyskinesia of esophagus: Secondary | ICD-10-CM | POA: Diagnosis present

## 2015-02-19 DIAGNOSIS — F419 Anxiety disorder, unspecified: Secondary | ICD-10-CM | POA: Diagnosis present

## 2015-02-19 DIAGNOSIS — K589 Irritable bowel syndrome without diarrhea: Secondary | ICD-10-CM | POA: Diagnosis not present

## 2015-02-19 DIAGNOSIS — Z955 Presence of coronary angioplasty implant and graft: Secondary | ICD-10-CM | POA: Diagnosis not present

## 2015-02-19 DIAGNOSIS — E860 Dehydration: Secondary | ICD-10-CM | POA: Diagnosis not present

## 2015-02-19 DIAGNOSIS — N1330 Unspecified hydronephrosis: Secondary | ICD-10-CM | POA: Diagnosis not present

## 2015-02-19 DIAGNOSIS — E785 Hyperlipidemia, unspecified: Secondary | ICD-10-CM | POA: Diagnosis present

## 2015-02-19 DIAGNOSIS — R1084 Generalized abdominal pain: Secondary | ICD-10-CM | POA: Diagnosis not present

## 2015-02-19 DIAGNOSIS — E1122 Type 2 diabetes mellitus with diabetic chronic kidney disease: Secondary | ICD-10-CM | POA: Diagnosis not present

## 2015-02-19 DIAGNOSIS — K8020 Calculus of gallbladder without cholecystitis without obstruction: Secondary | ICD-10-CM | POA: Diagnosis not present

## 2015-02-19 DIAGNOSIS — R112 Nausea with vomiting, unspecified: Secondary | ICD-10-CM | POA: Diagnosis present

## 2015-02-19 DIAGNOSIS — Z7982 Long term (current) use of aspirin: Secondary | ICD-10-CM | POA: Diagnosis not present

## 2015-02-19 DIAGNOSIS — I129 Hypertensive chronic kidney disease with stage 1 through stage 4 chronic kidney disease, or unspecified chronic kidney disease: Secondary | ICD-10-CM | POA: Diagnosis present

## 2015-02-19 DIAGNOSIS — N183 Chronic kidney disease, stage 3 (moderate): Secondary | ICD-10-CM | POA: Diagnosis present

## 2015-02-19 DIAGNOSIS — Z794 Long term (current) use of insulin: Secondary | ICD-10-CM | POA: Diagnosis not present

## 2015-02-19 DIAGNOSIS — Z8249 Family history of ischemic heart disease and other diseases of the circulatory system: Secondary | ICD-10-CM | POA: Diagnosis not present

## 2015-02-19 DIAGNOSIS — Z79899 Other long term (current) drug therapy: Secondary | ICD-10-CM | POA: Diagnosis not present

## 2015-02-19 DIAGNOSIS — D509 Iron deficiency anemia, unspecified: Secondary | ICD-10-CM | POA: Diagnosis present

## 2015-02-19 DIAGNOSIS — N189 Chronic kidney disease, unspecified: Secondary | ICD-10-CM

## 2015-02-19 DIAGNOSIS — K219 Gastro-esophageal reflux disease without esophagitis: Secondary | ICD-10-CM | POA: Diagnosis present

## 2015-02-19 DIAGNOSIS — R1013 Epigastric pain: Secondary | ICD-10-CM | POA: Diagnosis not present

## 2015-02-19 DIAGNOSIS — R54 Age-related physical debility: Secondary | ICD-10-CM | POA: Diagnosis present

## 2015-02-19 DIAGNOSIS — I2510 Atherosclerotic heart disease of native coronary artery without angina pectoris: Secondary | ICD-10-CM | POA: Diagnosis not present

## 2015-02-19 DIAGNOSIS — G894 Chronic pain syndrome: Secondary | ICD-10-CM | POA: Diagnosis present

## 2015-02-19 DIAGNOSIS — J45909 Unspecified asthma, uncomplicated: Secondary | ICD-10-CM | POA: Diagnosis present

## 2015-02-19 DIAGNOSIS — E878 Other disorders of electrolyte and fluid balance, not elsewhere classified: Secondary | ICD-10-CM | POA: Diagnosis not present

## 2015-02-19 DIAGNOSIS — E119 Type 2 diabetes mellitus without complications: Secondary | ICD-10-CM | POA: Diagnosis not present

## 2015-02-19 DIAGNOSIS — Z833 Family history of diabetes mellitus: Secondary | ICD-10-CM | POA: Diagnosis not present

## 2015-02-19 DIAGNOSIS — E870 Hyperosmolality and hypernatremia: Secondary | ICD-10-CM | POA: Diagnosis not present

## 2015-02-19 DIAGNOSIS — I252 Old myocardial infarction: Secondary | ICD-10-CM | POA: Diagnosis not present

## 2015-02-19 LAB — GASTROINTESTINAL PANEL BY PCR, STOOL (REPLACES STOOL CULTURE)
ADENOVIRUS F40/41: NOT DETECTED
ASTROVIRUS: NOT DETECTED
CAMPYLOBACTER SPECIES: NOT DETECTED
CRYPTOSPORIDIUM: NOT DETECTED
Cyclospora cayetanensis: NOT DETECTED
E. coli O157: NOT DETECTED
ENTEROAGGREGATIVE E COLI (EAEC): NOT DETECTED
ENTEROPATHOGENIC E COLI (EPEC): NOT DETECTED
ENTEROTOXIGENIC E COLI (ETEC): NOT DETECTED
Entamoeba histolytica: NOT DETECTED
Giardia lamblia: NOT DETECTED
NOROVIRUS GI/GII: NOT DETECTED
PLESIMONAS SHIGELLOIDES: NOT DETECTED
ROTAVIRUS A: NOT DETECTED
SHIGA LIKE TOXIN PRODUCING E COLI (STEC): NOT DETECTED
Salmonella species: NOT DETECTED
Sapovirus (I, II, IV, and V): NOT DETECTED
Shigella/Enteroinvasive E coli (EIEC): NOT DETECTED
Vibrio cholerae: NOT DETECTED
Vibrio species: NOT DETECTED
Yersinia enterocolitica: NOT DETECTED

## 2015-02-19 LAB — CBC
HEMATOCRIT: 39.3 % (ref 36.0–46.0)
HEMOGLOBIN: 12.5 g/dL (ref 12.0–15.0)
MCH: 29.2 pg (ref 26.0–34.0)
MCHC: 31.8 g/dL (ref 30.0–36.0)
MCV: 91.8 fL (ref 78.0–100.0)
Platelets: 284 10*3/uL (ref 150–400)
RBC: 4.28 MIL/uL (ref 3.87–5.11)
RDW: 15.3 % (ref 11.5–15.5)
WBC: 4.7 10*3/uL (ref 4.0–10.5)

## 2015-02-19 LAB — BASIC METABOLIC PANEL
Anion gap: 8 (ref 5–15)
BUN: 17 mg/dL (ref 6–20)
CHLORIDE: 110 mmol/L (ref 101–111)
CO2: 23 mmol/L (ref 22–32)
Calcium: 9.1 mg/dL (ref 8.9–10.3)
Creatinine, Ser: 1.39 mg/dL — ABNORMAL HIGH (ref 0.44–1.00)
GFR calc Af Amer: 45 mL/min — ABNORMAL LOW (ref >60)
GFR calc non Af Amer: 39 mL/min — ABNORMAL LOW (ref >60)
Glucose, Bld: 114 mg/dL — ABNORMAL HIGH (ref 65–99)
POTASSIUM: 4.3 mmol/L (ref 3.5–5.1)
SODIUM: 141 mmol/L (ref 135–145)

## 2015-02-19 LAB — LIPASE, BLOOD: LIPASE: 32 U/L (ref 11–51)

## 2015-02-19 LAB — RAPID URINE DRUG SCREEN, HOSP PERFORMED
AMPHETAMINES: NOT DETECTED
BARBITURATES: NOT DETECTED
Benzodiazepines: NOT DETECTED
Cocaine: NOT DETECTED
Opiates: NOT DETECTED
TETRAHYDROCANNABINOL: NOT DETECTED

## 2015-02-19 LAB — CBG MONITORING, ED: Glucose-Capillary: 128 mg/dL — ABNORMAL HIGH (ref 65–99)

## 2015-02-19 LAB — LACTIC ACID, PLASMA
Lactic Acid, Venous: 1.1 mmol/L (ref 0.5–2.0)
Lactic Acid, Venous: 1.2 mmol/L (ref 0.5–2.0)

## 2015-02-19 LAB — C DIFFICILE QUICK SCREEN W PCR REFLEX
C DIFFICILE (CDIFF) INTERP: NEGATIVE
C DIFFICILE (CDIFF) TOXIN: NEGATIVE
C DIFFICLE (CDIFF) ANTIGEN: NEGATIVE

## 2015-02-19 LAB — GLUCOSE, CAPILLARY
GLUCOSE-CAPILLARY: 118 mg/dL — AB (ref 65–99)
Glucose-Capillary: 134 mg/dL — ABNORMAL HIGH (ref 65–99)

## 2015-02-19 MED ORDER — BUDESONIDE-FORMOTEROL FUMARATE 160-4.5 MCG/ACT IN AERO
2.0000 | INHALATION_SPRAY | Freq: Two times a day (BID) | RESPIRATORY_TRACT | Status: DC
Start: 2015-02-19 — End: 2015-02-24
  Administered 2015-02-19 – 2015-02-22 (×6): 2 via RESPIRATORY_TRACT
  Filled 2015-02-19 (×2): qty 6

## 2015-02-19 MED ORDER — SODIUM CHLORIDE 0.9 % IV BOLUS (SEPSIS)
1000.0000 mL | Freq: Once | INTRAVENOUS | Status: AC
  Administered 2015-02-19: 1000 mL via INTRAVENOUS

## 2015-02-19 MED ORDER — FENTANYL CITRATE (PF) 100 MCG/2ML IJ SOLN
50.0000 ug | Freq: Once | INTRAMUSCULAR | Status: AC
  Administered 2015-02-19: 50 ug via INTRAVENOUS
  Filled 2015-02-19: qty 2

## 2015-02-19 MED ORDER — ATROPINE SULFATE 1 % OP SOLN
1.0000 [drp] | Freq: Every day | OPHTHALMIC | Status: DC
Start: 2015-02-19 — End: 2015-02-23
  Administered 2015-02-20 – 2015-02-21 (×2): 1 [drp] via OPHTHALMIC
  Filled 2015-02-19: qty 2

## 2015-02-19 MED ORDER — INSULIN GLARGINE 100 UNIT/ML ~~LOC~~ SOLN
20.0000 [IU] | Freq: Every day | SUBCUTANEOUS | Status: DC
  Administered 2015-02-19 – 2015-02-23 (×5): 20 [IU] via SUBCUTANEOUS
  Filled 2015-02-19 (×8): qty 0.2

## 2015-02-19 MED ORDER — KETOROLAC TROMETHAMINE 0.5 % OP SOLN
1.0000 [drp] | Freq: Four times a day (QID) | OPHTHALMIC | Status: DC
  Administered 2015-02-19 – 2015-02-22 (×12): 1 [drp] via OPHTHALMIC
  Filled 2015-02-19: qty 3

## 2015-02-19 MED ORDER — ASPIRIN EC 81 MG PO TBEC
81.0000 mg | DELAYED_RELEASE_TABLET | Freq: Every day | ORAL | Status: DC
  Administered 2015-02-19 – 2015-02-24 (×6): 81 mg via ORAL
  Filled 2015-02-19 (×6): qty 1

## 2015-02-19 MED ORDER — INSULIN ASPART 100 UNIT/ML ~~LOC~~ SOLN: 0.0000 [IU] | Freq: Every day | SUBCUTANEOUS | Status: DC

## 2015-02-19 MED ORDER — GATIFLOXACIN 0.5 % OP SOLN
1.0000 [drp] | Freq: Four times a day (QID) | OPHTHALMIC | Status: DC
  Administered 2015-02-19 – 2015-02-22 (×13): 1 [drp] via OPHTHALMIC
  Filled 2015-02-19 (×2): qty 2.5

## 2015-02-19 MED ORDER — INSULIN ASPART 100 UNIT/ML ~~LOC~~ SOLN
0.0000 [IU] | Freq: Three times a day (TID) | SUBCUTANEOUS | Status: DC
  Administered 2015-02-19 – 2015-02-22 (×5): 1 [IU] via SUBCUTANEOUS
  Administered 2015-02-22: 2 [IU] via SUBCUTANEOUS
  Administered 2015-02-23: 1 [IU] via SUBCUTANEOUS
  Administered 2015-02-24: 2 [IU] via SUBCUTANEOUS
  Filled 2015-02-19 (×2): qty 1

## 2015-02-19 MED ORDER — HYDROCODONE-ACETAMINOPHEN 5-325 MG PO TABS
1.0000 | ORAL_TABLET | ORAL | Status: DC | PRN
  Administered 2015-02-20: 1 via ORAL
  Filled 2015-02-19: qty 1

## 2015-02-19 MED ORDER — HYDROCODONE-ACETAMINOPHEN 10-325 MG PO TABS
1.0000 | ORAL_TABLET | ORAL | Status: DC | PRN
  Administered 2015-02-21 – 2015-02-24 (×6): 1 via ORAL
  Filled 2015-02-19 (×7): qty 1

## 2015-02-19 MED ORDER — ACETAMINOPHEN 650 MG RE SUPP: 650.0000 mg | Freq: Four times a day (QID) | RECTAL | Status: DC | PRN

## 2015-02-19 MED ORDER — PANTOPRAZOLE SODIUM 40 MG PO TBEC
40.0000 mg | DELAYED_RELEASE_TABLET | Freq: Every day | ORAL | Status: DC
  Administered 2015-02-19 – 2015-02-20 (×2): 40 mg via ORAL
  Filled 2015-02-19 (×2): qty 1

## 2015-02-19 MED ORDER — ENOXAPARIN SODIUM 40 MG/0.4ML ~~LOC~~ SOLN
40.0000 mg | SUBCUTANEOUS | Status: DC
  Administered 2015-02-19 – 2015-02-22 (×4): 40 mg via SUBCUTANEOUS
  Filled 2015-02-19 (×3): qty 0.4

## 2015-02-19 MED ORDER — ALBUTEROL SULFATE (2.5 MG/3ML) 0.083% IN NEBU
2.5000 mg | INHALATION_SOLUTION | Freq: Two times a day (BID) | RESPIRATORY_TRACT | Status: DC
  Administered 2015-02-19 – 2015-02-24 (×10): 2.5 mg via RESPIRATORY_TRACT
  Filled 2015-02-19 (×10): qty 3

## 2015-02-19 MED ORDER — SODIUM CHLORIDE 0.9 % IV SOLN
INTRAVENOUS | Status: DC
  Administered 2015-02-19 – 2015-02-20 (×2): via INTRAVENOUS
  Filled 2015-02-19: qty 1000

## 2015-02-19 MED ORDER — ACETAMINOPHEN 325 MG PO TABS: 650.0000 mg | ORAL_TABLET | Freq: Four times a day (QID) | ORAL | Status: DC | PRN

## 2015-02-19 MED ORDER — SUCRALFATE 1 GM/10ML PO SUSP
1.0000 g | Freq: Three times a day (TID) | ORAL | Status: DC
  Administered 2015-02-19 – 2015-02-24 (×20): 1 g via ORAL
  Filled 2015-02-19 (×23): qty 10

## 2015-02-19 MED ORDER — ALPRAZOLAM 0.25 MG PO TABS
0.2500 mg | ORAL_TABLET | Freq: Two times a day (BID) | ORAL | Status: DC
  Administered 2015-02-19 – 2015-02-24 (×11): 0.25 mg via ORAL
  Filled 2015-02-19 (×11): qty 1

## 2015-02-19 MED ORDER — LOPERAMIDE HCL 2 MG PO CAPS
2.0000 mg | ORAL_CAPSULE | Freq: Three times a day (TID) | ORAL | Status: DC
  Administered 2015-02-19 (×2): 2 mg via ORAL
  Filled 2015-02-19 (×2): qty 1

## 2015-02-19 MED ORDER — KETOROLAC TROMETHAMINE 0.5 % OP SOLN
1.0000 [drp] | Freq: Four times a day (QID) | OPHTHALMIC | Status: DC
  Filled 2015-02-19: qty 5

## 2015-02-19 MED ORDER — METOPROLOL SUCCINATE ER 25 MG PO TB24
25.0000 mg | ORAL_TABLET | Freq: Every evening | ORAL | Status: DC
  Administered 2015-02-19 – 2015-02-23 (×5): 25 mg via ORAL
  Filled 2015-02-19 (×5): qty 1

## 2015-02-19 MED ORDER — PREGABALIN 75 MG PO CAPS
150.0000 mg | ORAL_CAPSULE | Freq: Two times a day (BID) | ORAL | Status: DC
  Administered 2015-02-19 – 2015-02-24 (×11): 150 mg via ORAL
  Filled 2015-02-19 (×11): qty 2

## 2015-02-19 MED ORDER — ALBUTEROL SULFATE (2.5 MG/3ML) 0.083% IN NEBU: 2.5000 mg | INHALATION_SOLUTION | RESPIRATORY_TRACT | Status: DC | PRN

## 2015-02-19 MED ORDER — PREDNISOLONE ACETATE 1 % OP SUSP
1.0000 [drp] | Freq: Four times a day (QID) | OPHTHALMIC | Status: DC
  Administered 2015-02-19 – 2015-02-22 (×12): 1 [drp] via OPHTHALMIC
  Filled 2015-02-19 (×2): qty 1

## 2015-02-19 MED ORDER — DIFLUPREDNATE 0.05 % OP EMUL
1.0000 [drp] | Freq: Every day | OPHTHALMIC | Status: DC
  Filled 2015-02-19 (×2): qty 5

## 2015-02-19 MED ORDER — ATORVASTATIN CALCIUM 20 MG PO TABS
20.0000 mg | ORAL_TABLET | Freq: Every day | ORAL | Status: DC
  Administered 2015-02-19 – 2015-02-24 (×6): 20 mg via ORAL
  Filled 2015-02-19: qty 1
  Filled 2015-02-19: qty 2
  Filled 2015-02-19 (×4): qty 1

## 2015-02-19 NOTE — ED Notes (Signed)
Attempted to call report to 5N - Network engineer requested for RN to call Santiago Glad, RN, back in approx 10 min.

## 2015-02-19 NOTE — ED Notes (Signed)
Pharmacy called and advised will send pt's eye drops to floor.

## 2015-02-19 NOTE — Progress Notes (Signed)
Patient had a church member Gasper Sells) visit her on tonight she left her phone number [phone] as a contact. She also left patients son Belenda Cruise) phone number [phone] as a contact as well. Thanks.

## 2015-02-19 NOTE — ED Notes (Signed)
No IV fluids infusing at this time.

## 2015-02-19 NOTE — ED Notes (Signed)
Clear liquid diet called in.

## 2015-02-19 NOTE — ED Notes (Signed)
Pt being transported to 5N02 by Opal Sidles, EMT.

## 2015-02-19 NOTE — ED Notes (Signed)
Patient has completed 2nd bottle of oral contrast.

## 2015-02-19 NOTE — ED Notes (Signed)
Patient was given a ginger ale and gram crackers, A regular diet ordered for patient for lunch.

## 2015-02-19 NOTE — ED Notes (Signed)
Santiago Glad, RN, advised will call back.

## 2015-02-19 NOTE — Progress Notes (Signed)
PATIENT DETAILS Name: [name] Age: 65 y.o. Sex: female Date of Birth: 1950/03/01 Admit Date: 02/18/2015 Admitting Physician Orson Eva, MD [id number] J, MD  Subjective: Continues to have diarrhea-poor historian. Tearful, stutters-per patient-this is her baseline speech.  Assessment/Plan: Active Problems: Diarrhea: Ongoing for the past 2-3 months. Colonoscopy/EGD/Capsule Endo 2013 negative for significant abnormalities. C. difficile PCR negative. Await GI pathogen panel. Start scheduled Imodium, GI consulted. Follow  Acute on chronic kidney disease stage III: ARF secondary to prerenal azotemia in the setting of diarrhea/dehydration. Improving with IV fluids.  Hypertension: Continue metoprolol-BP appears controlled. Resume amlodipine in the next 2 days.  History of CAD: Currently without chest pain or shortness of breath. Continue aspirin, statin and metoprolol.  Type 2 Diabetes: CBGs currently stable with 20 units of Lantus and SSI. Resume metformin on discharge Follow and adjust accordingly  Dyslipidemia: Continue statin  Prior history of cocaine abuse: UDS 1/14 negative.  Anxiety: Appears anxious, but denies any depression-like symptoms to me. Continue Xanax as needed.  Bronchial asthma: Lungs clear, this appears stable. Continue bronchodilators.  GERD: Continue PPI  Disposition: Remain inpatient  Antimicrobial agents  See below  Anti-infectives    None      DVT Prophylaxis: Prophylactic Lovenox   Code Status: Full code   Family Communication None at bedside  Procedures: None  CONSULTS:  GI  Time spent 30 minutes-Greater than 50% of this time was spent in counseling, explanation of diagnosis, planning of further management, and coordination of care.  MEDICATIONS: Scheduled Meds: . albuterol  2.5 mg Nebulization BID  . ALPRAZolam  0.25 mg Oral BID  . aspirin EC  81 mg Oral Daily  . atorvastatin  20 mg Oral Daily  .  atropine  1 drop Both Eyes Daily  . budesonide-formoterol  2 puff Inhalation BID  . Difluprednate  1 drop Both Eyes Daily  . enoxaparin (LOVENOX) injection  40 mg Subcutaneous Q24H  . gatifloxacin  1 drop Both Eyes QID  . insulin aspart  0-5 Units Subcutaneous QHS  . insulin aspart  0-9 Units Subcutaneous TID WC  . insulin glargine  20 Units Subcutaneous Q2200  . ketorolac  1 drop Both Eyes QID  . loperamide  2 mg Oral TID  . metoprolol succinate  25 mg Oral QPM  . pantoprazole  40 mg Oral Daily  . prednisoLONE acetate  1 drop Left Eye QID  . pregabalin  150 mg Oral BID  . sucralfate  1 g Oral TID WC & HS   Continuous Infusions: . sodium chloride 0.9 % 1,000 mL infusion     PRN Meds:.acetaminophen **OR** acetaminophen, HYDROcodone-acetaminophen, HYDROcodone-acetaminophen    PHYSICAL EXAM: Vital signs in last 24 hours: Filed Vitals:   02/19/15 1211 02/19/15 1230 02/19/15 1330 02/19/15 1400  BP:  112/65 157/67 149/78  Pulse:  71 80 88  Temp: 98.3 F (36.8 C)     TempSrc: Oral     Resp:  14 27 20   SpO2:  93% 93% 93%    Weight change:  There were no vitals filed for this visit. There is no weight on file to calculate BMI.   Gen Exam: Awake and alert. Stuttering speech (baseline per patient).  Neck: Supple, No JVD.   Chest: B/L Clear.   CVS: S1 S2 Regular, no murmurs.  Abdomen: soft, BS +, non tender, non distended.  Extremities: no edema, lower extremities warm to touch.  Neurologic: Non Focal.   Skin: No Rash.   Wounds: N/A.   Intake/Output from previous day:  Intake/Output Summary (Last 24 hours) at 02/19/15 1516 Last data filed at 02/19/15 1443  Gross per 24 hour  Intake   1000 ml  Output    400 ml  Net    600 ml     LAB RESULTS: CBC  Recent Labs Lab 02/18/15 1810 02/19/15 0934  WBC 7.3 4.7  HGB 13.7 12.5  HCT 42.3 39.3  PLT 332 284  MCV 89.6 91.8  MCH 29.0 29.2  MCHC 32.4 31.8  RDW 15.1 15.3    Chemistries   Recent Labs Lab  02/18/15 1810 02/19/15 0934  NA 142 141  K 4.4 4.3  CL 109 110  CO2 21* 23  GLUCOSE 91 114*  BUN 23* 17  CREATININE 1.67* 1.39*  CALCIUM 9.7 9.1    CBG:  Recent Labs Lab 02/18/15 2136 02/19/15 1215  GLUCAP 103* 128*    GFR CrCl cannot be calculated (Unknown ideal weight.).  Coagulation profile No results for input(s): INR, PROTIME in the last 168 hours.  Cardiac Enzymes No results for input(s): CKMB, TROPONINI, MYOGLOBIN in the last 168 hours.  Invalid input(s): CK  Invalid input(s): POCBNP No results for input(s): DDIMER in the last 72 hours. No results for input(s): HGBA1C in the last 72 hours. No results for input(s): CHOL, HDL, LDLCALC, TRIG, CHOLHDL, LDLDIRECT in the last 72 hours. No results for input(s): TSH, T4TOTAL, T3FREE, THYROIDAB in the last 72 hours.  Invalid input(s): FREET3 No results for input(s): VITAMINB12, FOLATE, FERRITIN, TIBC, IRON, RETICCTPCT in the last 72 hours.  Recent Labs  02/19/15 0934  LIPASE 32    Urine Studies No results for input(s): UHGB, CRYS in the last 72 hours.  Invalid input(s): UACOL, UAPR, USPG, UPH, UTP, UGL, UKET, UBIL, UNIT, UROB, ULEU, UEPI, UWBC, URBC, UBAC, CAST, UCOM, BILUA  MICROBIOLOGY: Recent Results (from the past 240 hour(s))  C difficile quick scan w PCR reflex     Status: None   Collection Time: 02/19/15  6:56 AM  Result Value Ref Range Status   C Diff antigen NEGATIVE NEGATIVE Final   C Diff toxin NEGATIVE NEGATIVE Final   C Diff interpretation Negative for toxigenic C. difficile  Final    RADIOLOGY STUDIES/RESULTS: Ct Abdomen Pelvis Wo Contrast  02/19/2015  CLINICAL DATA:  Acute onset of nausea, vomiting and diarrhea. Mid abdominal pain, radiating from the xiphoid process down to the pelvis. Initial encounter. EXAM: CT ABDOMEN AND PELVIS WITHOUT CONTRAST TECHNIQUE: Multidetector CT imaging of the abdomen and pelvis was performed following the standard protocol without IV contrast. COMPARISON:   CT of the abdomen and pelvis performed 03/31/2014 FINDINGS: Minimal bibasilar atelectasis is noted. Diffuse coronary artery calcifications are seen. The liver and spleen are unremarkable in appearance. A stone is noted within the gallbladder. The gallbladder is otherwise unremarkable. The pancreas and adrenal glands are unremarkable. Apparent mild right-sided hydronephrosis is noted, with mild right renal atrophy, but no evidence of distal obstructing stone. This may reflect an underlying stricture, or possibly a recently passed stone. The left kidney is unremarkable in appearance. No renal or ureteral stones are identified. No significant perinephric stranding is seen. No free fluid is identified. The small bowel is unremarkable in appearance. Clumping of bowel at the upper pelvis is thought to be transient in nature. The stomach is within normal limits. No acute vascular abnormalities are seen. Scattered calcification is noted along the abdominal  aorta and its branches. The appendix is not definitely characterized; there is no evidence of appendicitis. The colon is unremarkable in appearance. The bladder is moderately distended and grossly unremarkable. The uterus is unremarkable in appearance. The ovaries are relatively symmetric. No suspicious adnexal masses are seen. No inguinal lymphadenopathy is seen. No acute osseous abnormalities are identified. Facet disease is noted along the lumbar spine. IMPRESSION: 1. Apparent mild right-sided hydronephrosis, with mild right renal atrophy, but no definite distal obstructing stone. This may reflect an underlying stricture, or possibly a recently passed stone. 2. Scattered calcification along the abdominal aorta and its branches. 3. Cholelithiasis.  Gallbladder otherwise unremarkable. 4. Diffuse coronary artery calcifications seen. Electronically Signed   By: Garald Balding M.D.   On: 02/19/2015 02:38    Oren Binet, MD  [facility] Pager:[phone]  [phone]  If 7PM-7AM, please contact night-coverage [email or web address] Password [email or web address] 02/19/2015, 3:16 PM   LOS: 0 days

## 2015-02-19 NOTE — H&P (Signed)
History and Physical  [name] [id number] DOB: [birth year] DOA: 02/18/2015   PCP: Elyn Peers, MD  Referring Physician: ED/ Delos Haring, PA-C  Chief Complaint: diarrhea  HPI:  65 year old female with a history of hyperlipidemia, diabetes mellitus type 2, hypertension, coronary artery disease, asthma presented with diarrhea and abdominal pain. Unfortunately, the patient is not a good historian. She states that she has had diarrhea and abdominal pain for 3-4 months, but it has worsened in the past few weeks. The patient had a colonoscopy on 11/20/2011 which revealed 2 polyps but otherwise was unremarkable. She had an EGD on the same day which was unremarkable. The patient has not followed up with her gastroenterologist for her diarrhea. The patient is unable to tell me the last time she went to see her primary care physician, but states that she has been trying to take Lomotil which does not seem to help her diarrhea. The patient is unable to tell me if she's had any recent stool studies or other diagnostic tests. She denies any hematochezia, melena, vomiting, hematemesis, fevers, chills, chest pain, shortness of breath. She denies any recent travels, raw or exotic foods, or sick contacts. She feels that she has lost 15 pounds in the last few months, but she is not able to, either exact period of time. She normally has 4-5 loose bowel movements daily for the past 3-4 months. She denies any new medications except her Lomotil. However, she seems to have very poor insight regarding any of her medications.  In the emergency department, CT of the abdomen and pelvis revealed right renal atrophy without any obstructive stone with mild right hydronephrosis. Otherwise, CT of the abdomen and pelvis was negative for any acute findings. Serum creatinine was 1.67 and BMP otherwise was unremarkable. CBC was unremarkable. Urinalysis showed 6-30 WBCs. She was afebrile and hemodynamically  stable. Assessment/Plan: Abdominal pain and diarrhea -CT abdomen and pelvis showed mild right hydronephrosis with what appears to be chronic right renal atrophy -Stool for C. Difficile -Stool pathogen panel -Stool lactoferrin -If the above is negative, restart antimotility agents--hold for now pending stool studies -Check lipase -Clear liquid diet, advance as tolerated  -Urine drug screen--patient previously used cocaine  -Urinalysis shows 6-30 WBC--add culture Acute on chronic renal failure/dehydration  -Baseline creatinine 1.0-1.2  -Continue IV fluids  Right hydronephrosis  -This appears to be chronic given the patient's right renal atrophy and scarring  -outpt urology followup Hypertension  -Hold amlodipine  -Continue lower dose metoprolol succinate  Diabetes mellitus type 2  -Hemoglobin A1c  -NovoLog sliding scale  -Reduced dose Lantus  -Hold metformin  Hyperlipidemia  -Continue statin  Anxiety  -Continue Xanax  Coronary artery disease -No chest pain presently -Obtain EKG -Continue aspirin and statin      Past Medical History  Diagnosis Date  . History of colonic polyps   . GERD (gastroesophageal reflux disease)   . Hyperlipidemia   . Diabetes mellitus without mention of complication   . Hypertension   . Myocardial infarction ([place])   . Coronary artery disease   . Fatty liver disease, nonalcoholic   . Cholelithiasis   . Iron deficiency anemia   . Angina   . Shortness of breath   . Asthma   . Arthritis     HANDS"  . Cocaine abuse 07/2012    per [facility] drug screen  . History of intestinal surgery    Past Surgical History  Procedure Laterality Date  . Right colectomy  After colonic perforation  . Tubal ligation    . Coronary angioplasty with stent placement    . Esophagogastroduodenoscopy  11/30/2011    Procedure: ESOPHAGOGASTRODUODENOSCOPY (EGD);  Surgeon: Lafayette Dragon, MD;  Location: [facility];  Service: Endoscopy;  Laterality: N/A;  .  Colonoscopy  11/30/2011    Procedure: COLONOSCOPY;  Surgeon: Lafayette Dragon, MD;  Location: [facility] ENDOSCOPY;  Service: Endoscopy;  Laterality: N/A;  . Left heart catheterization with coronary angiogram N/A 02/20/2011    Procedure: LEFT HEART CATHETERIZATION WITH CORONARY ANGIOGRAM;  Surgeon: Clent Demark, MD;  Location: [facility] CATH LAB;  Service: Cardiovascular;  Laterality: N/A;  . Abdominal angiogram  02/20/2011    Procedure: ABDOMINAL ANGIOGRAM;  Surgeon: Clent Demark, MD;  Location: [facility] CATH LAB;  Service: Cardiovascular;;   Social History:  reports that she quit smoking about 4 years ago. Her smoking use included Cigarettes. She has a 23.5 pack-year smoking history. She has never used smokeless tobacco. She reports that she uses illicit drugs (Cocaine). She reports that she does not drink alcohol.   Family History  Problem Relation Age of Onset  . Heart disease Mother   . Diabetes Mother   . Colon cancer Neg Hx   . Cancer Father     unsure what kind  . Diabetes Sister      No Known Allergies    Prior to Admission medications   Medication Sig Start Date End Date Taking? Authorizing Provider  acetaminophen (TYLENOL) 500 MG tablet Take 500 mg by mouth every 6 (six) hours as needed for mild pain.   Yes Historical Provider, MD  albuterol (PROVENTIL) (2.5 MG/3ML) 0.083% nebulizer solution Take 2.5 mg by nebulization 2 (two) times daily.   Yes Historical Provider, MD  ALPRAZolam (XANAX) 0.25 MG tablet Take 0.25 mg by mouth 2 (two) times daily.    Yes Historical Provider, MD  amLODipine (NORVASC) 5 MG tablet Take 5 mg by mouth daily.   Yes Historical Provider, MD  aspirin EC 81 MG EC tablet Take 1 tablet (81 mg total) by mouth daily. 02/06/12  Yes Patrecia Pour, NP  atorvastatin (LIPITOR) 20 MG tablet Take 20 mg by mouth daily.   Yes Historical Provider, MD  atropine 1 % ophthalmic solution Place 1 drop into both eyes daily.  05/07/14  Yes Historical Provider, MD  Besifloxacin HCl (BESIVANCE)  0.6 % SUSP Apply 1-2 drops to eye 2 (two) times daily as needed. For dry eyes per patient   Yes Historical Provider, MD  budesonide-formoterol (SYMBICORT) 160-4.5 MCG/ACT inhaler Inhale 2 puffs into the lungs 2 (two) times daily.   Yes Historical Provider, MD  DUREZOL 0.05 % EMUL Place 1 drop into both eyes daily. Taking every day per patient 03/02/14  Yes Historical Provider, MD  Insulin Glargine (LANTUS SOLOSTAR) 100 UNIT/ML Solostar Pen Inject 40 Units into the skin 2 (two) times daily. 04/02/14  Yes Robbie Lis, MD  Iron-FA-B Cmp-C-Biot-Probiotic (FUSION PLUS PO) Take 1 capsule by mouth daily.   Yes Historical Provider, MD  ketorolac (ACULAR) 0.4 % SOLN Place 1 drop into both eyes 4 (four) times daily.   Yes Historical Provider, MD  metFORMIN (GLUCOPHAGE) 1000 MG tablet Take 1,000 mg by mouth 2 (two) times daily with a meal.   Yes Historical Provider, MD  metoprolol succinate (TOPROL-XL) 50 MG 24 hr tablet Take 50 mg by mouth every evening. Take with or immediately following a meal.   Yes Historical Provider, MD  moxifloxacin (VIGAMOX) 0.5 %  ophthalmic solution Place 1 drop into both eyes 3 (three) times daily.   Yes Historical Provider, MD  nitroGLYCERIN (NITROSTAT) 0.4 MG SL tablet Place 1 tablet (0.4 mg total) under the tongue every 5 (five) minutes as needed. For chest pain Patient taking differently: Place 0.4 mg under the tongue every 5 (five) minutes as needed for chest pain.  02/06/12  Yes Patrecia Pour, NP  pantoprazole (PROTONIX) 40 MG tablet Take 40 mg by mouth daily.   Yes Historical Provider, MD  prednisoLONE acetate (PRED FORTE) 1 % ophthalmic suspension Place 1 drop into the left eye 4 (four) times daily.   Yes Historical Provider, MD  pregabalin (LYRICA) 150 MG capsule Take 150 mg by mouth 2 (two) times daily.    Yes Historical Provider, MD  sucralfate (CARAFATE) 1 GM/10ML suspension Take 1 g by mouth 4 (four) times daily -  with meals and at bedtime.   Yes Historical Provider, MD   diphenoxylate-atropine (LOMOTIL) 2.5-0.025 MG per tablet Take 1-2 tablets by mouth See admin instructions. Take 2 tablets by mouth on onset of diarrhea, repeat in one hour; the take 1 tablet three times daily until diarrhea stops    Historical Provider, MD  HYDROcodone-acetaminophen (NORCO) 10-325 MG per tablet Take 1 tablet by mouth every 4 (four) hours as needed for moderate pain.    Historical Provider, MD    Review of Systems:  Constitutional:  No weight loss, night sweats, Fevers, chills, fatigue.  Head&Eyes: No headache.  No vision loss.  No eye pain or scotoma ENT:  No Difficulty swallowing,Tooth/dental problems,Sore throat,  No ear ache, post nasal drip,  Cardio-vascular:  No chest pain, Orthopnea, PND, swelling in lower extremities,  dizziness, palpitations  GI:  No  abdominal pain, nausea, vomiting, diarrhea, loss of appetite, hematochezia, melena, heartburn, indigestion, Resp:  No shortness of breath with exertion or at rest. No cough. No coughing up of blood .No wheezing.No chest wall deformity  Skin:  no rash or lesions.  GU:  no dysuria, change in color of urine, no urgency or frequency. No flank pain.  Musculoskeletal:  No joint pain or swelling. No decreased range of motion. No back pain.  Psych:  No change in mood or affect. No depression or anxiety. Neurologic: No headache, no dysesthesia, no focal weakness, no vision loss. No syncope  Physical Exam: Filed Vitals:   02/19/15 0045 02/19/15 0100 02/19/15 0145 02/19/15 0315  BP: 131/72 142/73 112/59 145/63  Pulse: 55 62 59 60  Temp:      TempSrc:      Resp: 21 15 17 14   SpO2: 97% 96% 95% 100%   General:  A&O x 3, NAD, nontoxic, pleasant/cooperative Head/Eye: No conjunctival hemorrhage, no icterus, [place]/AT, No nystagmus ENT:  No icterus,  No thrush, good dentition, no pharyngeal exudate Neck:  No masses, no lymphadenpathy, no bruits CV:  RRR, no rub, no gallop, no S3 Lung:  CTAB, good air movement, no wheeze,  no rhonchi Abdomen: soft/NT, +BS, nondistended, no peritoneal signs Ext: No cyanosis, No rashes, No petechiae, No lymphangitis, No edema Neuro: CNII-XII intact, strength 4/5 in bilateral upper and lower extremities, no dysmetria  Labs on Admission:  Basic Metabolic Panel:  Recent Labs Lab 02/18/15 1810  NA 142  K 4.4  CL 109  CO2 21*  GLUCOSE 91  BUN 23*  CREATININE 1.67*  CALCIUM 9.7   Liver Function Tests:  Recent Labs Lab 02/18/15 1810  AST 16  ALT 11*  ALKPHOS 81  BILITOT 0.8  PROT 8.1  ALBUMIN 3.7   No results for input(s): LIPASE, AMYLASE in the last 168 hours. No results for input(s): AMMONIA in the last 168 hours. CBC:  Recent Labs Lab 02/18/15 1810  WBC 7.3  HGB 13.7  HCT 42.3  MCV 89.6  PLT 332   Cardiac Enzymes: No results for input(s): CKTOTAL, CKMB, CKMBINDEX, TROPONINI in the last 168 hours. BNP: Invalid input(s): POCBNP CBG:  Recent Labs Lab 02/18/15 2136  GLUCAP 103*    Radiological Exams on Admission: Ct Abdomen Pelvis Wo Contrast  02/19/2015  CLINICAL DATA:  Acute onset of nausea, vomiting and diarrhea. Mid abdominal pain, radiating from the xiphoid process down to the pelvis. Initial encounter. EXAM: CT ABDOMEN AND PELVIS WITHOUT CONTRAST TECHNIQUE: Multidetector CT imaging of the abdomen and pelvis was performed following the standard protocol without IV contrast. COMPARISON:  CT of the abdomen and pelvis performed 03/31/2014 FINDINGS: Minimal bibasilar atelectasis is noted. Diffuse coronary artery calcifications are seen. The liver and spleen are unremarkable in appearance. A stone is noted within the gallbladder. The gallbladder is otherwise unremarkable. The pancreas and adrenal glands are unremarkable. Apparent mild right-sided hydronephrosis is noted, with mild right renal atrophy, but no evidence of distal obstructing stone. This may reflect an underlying stricture, or possibly a recently passed stone. The left kidney is  unremarkable in appearance. No renal or ureteral stones are identified. No significant perinephric stranding is seen. No free fluid is identified. The small bowel is unremarkable in appearance. Clumping of bowel at the upper pelvis is thought to be transient in nature. The stomach is within normal limits. No acute vascular abnormalities are seen. Scattered calcification is noted along the abdominal aorta and its branches. The appendix is not definitely characterized; there is no evidence of appendicitis. The colon is unremarkable in appearance. The bladder is moderately distended and grossly unremarkable. The uterus is unremarkable in appearance. The ovaries are relatively symmetric. No suspicious adnexal masses are seen. No inguinal lymphadenopathy is seen. No acute osseous abnormalities are identified. Facet disease is noted along the lumbar spine. IMPRESSION: 1. Apparent mild right-sided hydronephrosis, with mild right renal atrophy, but no definite distal obstructing stone. This may reflect an underlying stricture, or possibly a recently passed stone. 2. Scattered calcification along the abdominal aorta and its branches. 3. Cholelithiasis.  Gallbladder otherwise unremarkable. 4. Diffuse coronary artery calcifications seen. Electronically Signed   By: Garald Balding M.D.   On: 02/19/2015 02:38        Time spent:60 minutes Code Status:   FULL Family Communication:  No  Family at bedside   Ilias Stcharles, DO  [facility] Pager [phone]  If 7PM-7AM, please contact night-coverage [email or web address] Password [email or web address] 02/19/2015, 4:23 AM

## 2015-02-19 NOTE — ED Notes (Signed)
Patient CBG was 128.

## 2015-02-19 NOTE — ED Notes (Signed)
Pt talking w/her son on phone.

## 2015-02-19 NOTE — ED Notes (Signed)
Patient transported to CT 

## 2015-02-20 DIAGNOSIS — I10 Essential (primary) hypertension: Secondary | ICD-10-CM

## 2015-02-20 DIAGNOSIS — N189 Chronic kidney disease, unspecified: Secondary | ICD-10-CM

## 2015-02-20 DIAGNOSIS — N179 Acute kidney failure, unspecified: Principal | ICD-10-CM

## 2015-02-20 LAB — CBC
HCT: 37.5 % (ref 36.0–46.0)
Hemoglobin: 11.8 g/dL — ABNORMAL LOW (ref 12.0–15.0)
MCH: 28.9 pg (ref 26.0–34.0)
MCHC: 31.5 g/dL (ref 30.0–36.0)
MCV: 91.7 fL (ref 78.0–100.0)
PLATELETS: 250 10*3/uL (ref 150–400)
RBC: 4.09 MIL/uL (ref 3.87–5.11)
RDW: 15.5 % (ref 11.5–15.5)
WBC: 4.5 10*3/uL (ref 4.0–10.5)

## 2015-02-20 LAB — BASIC METABOLIC PANEL
ANION GAP: 6 (ref 5–15)
BUN: 13 mg/dL (ref 6–20)
CALCIUM: 9 mg/dL (ref 8.9–10.3)
CO2: 25 mmol/L (ref 22–32)
CREATININE: 1.13 mg/dL — AB (ref 0.44–1.00)
Chloride: 115 mmol/L — ABNORMAL HIGH (ref 101–111)
GFR, EST AFRICAN AMERICAN: 57 mL/min — AB (ref >60)
GFR, EST NON AFRICAN AMERICAN: 50 mL/min — AB (ref >60)
Glucose, Bld: 105 mg/dL — ABNORMAL HIGH (ref 65–99)
Potassium: 4.4 mmol/L (ref 3.5–5.1)
SODIUM: 146 mmol/L — AB (ref 135–145)

## 2015-02-20 LAB — GLUCOSE, CAPILLARY
GLUCOSE-CAPILLARY: 114 mg/dL — AB (ref 65–99)
GLUCOSE-CAPILLARY: 84 mg/dL (ref 65–99)
GLUCOSE-CAPILLARY: 99 mg/dL (ref 65–99)
Glucose-Capillary: 87 mg/dL (ref 65–99)

## 2015-02-20 LAB — URINE CULTURE

## 2015-02-20 MED ORDER — SODIUM CHLORIDE 0.45 % IV SOLN
INTRAVENOUS | Status: DC
Start: 2015-02-20 — End: 2015-02-21
  Administered 2015-02-20 – 2015-02-21 (×2): via INTRAVENOUS

## 2015-02-20 MED ORDER — PANTOPRAZOLE SODIUM 40 MG PO TBEC
40.0000 mg | DELAYED_RELEASE_TABLET | Freq: Two times a day (BID) | ORAL | Status: DC
  Administered 2015-02-20 – 2015-02-24 (×8): 40 mg via ORAL
  Filled 2015-02-20 (×8): qty 1

## 2015-02-20 NOTE — Progress Notes (Signed)
PATIENT DETAILS Name: [name] Age: 65 y.o. Sex: female Date of Birth: [birth year] Admit Date: 02/18/2015 Admitting Physician Orson Eva, MD [id number] J, MD  Subjective: Continues to have diarrhea-complains of occasional upper abdominal discomfort. Stuttering speech-this is her baseline  Assessment/Plan: Active Problems: Diarrhea: Ongoing for the past 2-3 months. Colonoscopy/EGD/Capsule Endo 2013 negative for significant abnormalities. C. difficile PCR and GI pathogen panel.seen by gastroenterology-recommendations are to hold off on starting antidiarrheal agents-await further recommendations from gastroenterology. Have asked RN to document number of stools in chart.   Acute on chronic kidney disease stage III: ARF secondary to prerenal azotemia in the setting of diarrhea/dehydration. Improving with IV fluids.   Hypernatremia: Mild, continue  hydration, recheck in a.m.  Hypertension: Continue metoprolol-BP appears controlled. Resume amlodipine in the next 2 days.  History of CAD: Currently without chest pain or shortness of breath. Continue aspirin, statin and metoprolol.  Type 2 Diabetes: CBGs currently stable with 20 units of Lantus and SSI. Resume metformin on discharge Follow and adjust accordingly  Dyslipidemia: Continue statin  Prior history of cocaine abuse: UDS 1/14 negative.  Anxiety: Appears anxious, but denies any depression-like symptoms to me. Continue Xanax as needed.  Bronchial asthma: Lungs clear, this appears stable. Continue bronchodilators.  GERD: Continue PPI  Disposition: Remain inpatient  Antimicrobial agents  See below  Anti-infectives    None      DVT Prophylaxis: Prophylactic Lovenox   Code Status: Full code   Family Communication None at bedside  Procedures: None  CONSULTS:  GI  Time spent 25 minutes-Greater than 50% of this time was spent in counseling, explanation of diagnosis, planning of further  management, and coordination of care.  MEDICATIONS: Scheduled Meds: . albuterol  2.5 mg Nebulization BID  . ALPRAZolam  0.25 mg Oral BID  . aspirin EC  81 mg Oral Daily  . atorvastatin  20 mg Oral Daily  . atropine  1 drop Both Eyes Daily  . budesonide-formoterol  2 puff Inhalation BID  . Difluprednate  1 drop Both Eyes Daily  . enoxaparin (LOVENOX) injection  40 mg Subcutaneous Q24H  . gatifloxacin  1 drop Both Eyes QID  . insulin aspart  0-5 Units Subcutaneous QHS  . insulin aspart  0-9 Units Subcutaneous TID WC  . insulin glargine  20 Units Subcutaneous Q2200  . ketorolac  1 drop Both Eyes QID  . metoprolol succinate  25 mg Oral QPM  . pantoprazole  40 mg Oral Daily  . prednisoLONE acetate  1 drop Left Eye QID  . pregabalin  150 mg Oral BID  . sucralfate  1 g Oral TID WC & HS   Continuous Infusions: . sodium chloride 0.9 % 1,000 mL infusion 75 mL/hr at 02/20/15 0835   PRN Meds:.acetaminophen **OR** acetaminophen, albuterol, HYDROcodone-acetaminophen, HYDROcodone-acetaminophen    PHYSICAL EXAM: Vital signs in last 24 hours: Filed Vitals:   02/19/15 2012 02/19/15 2145 02/20/15 0458 02/20/15 1030  BP: 135/45  126/51   Pulse: 62  57   Temp: 98.4 F (36.9 C)  98.3 F (36.8 C)   TempSrc: Oral  Oral   Resp: 16  16   SpO2: 98% 97% 94% 96%    Weight change:  There were no vitals filed for this visit. There is no weight on file to calculate BMI.   Gen Exam: Awake and alert. Stuttering speech (baseline per patient).  Neck: Supple, No JVD.  Chest: B/L Clear.   CVS: S1 S2 Regular, no murmurs.  Abdomen: soft, BS +, mildly tender in epigastric area, non distended.  Extremities: no edema, lower extremities warm to touch. Neurologic: Non Focal.   Skin: No Rash.   Wounds: N/A.   Intake/Output from previous day:  Intake/Output Summary (Last 24 hours) at 02/20/15 1203 Last data filed at 02/19/15 1700  Gross per 24 hour  Intake    240 ml  Output      0 ml  Net    240  ml     LAB RESULTS: CBC  Recent Labs Lab 02/18/15 1810 02/19/15 0934 02/20/15 0337  WBC 7.3 4.7 4.5  HGB 13.7 12.5 11.8*  HCT 42.3 39.3 37.5  PLT 332 284 250  MCV 89.6 91.8 91.7  MCH 29.0 29.2 28.9  MCHC 32.4 31.8 31.5  RDW 15.1 15.3 15.5    Chemistries   Recent Labs Lab 02/18/15 1810 02/19/15 0934 02/20/15 0337  NA 142 141 146*  K 4.4 4.3 4.4  CL 109 110 115*  CO2 21* 23 25  GLUCOSE 91 114* 105*  BUN 23* 17 13  CREATININE 1.67* 1.39* 1.13*  CALCIUM 9.7 9.1 9.0    CBG:  Recent Labs Lab 02/18/15 2136 02/19/15 1215 02/19/15 1655 02/19/15 2059 02/20/15 0613  GLUCAP 103* 128* 134* 118* 114*    GFR CrCl cannot be calculated (Unknown ideal weight.).  Coagulation profile No results for input(s): INR, PROTIME in the last 168 hours.  Cardiac Enzymes No results for input(s): CKMB, TROPONINI, MYOGLOBIN in the last 168 hours.  Invalid input(s): CK  Invalid input(s): POCBNP No results for input(s): DDIMER in the last 72 hours. No results for input(s): HGBA1C in the last 72 hours. No results for input(s): CHOL, HDL, LDLCALC, TRIG, CHOLHDL, LDLDIRECT in the last 72 hours. No results for input(s): TSH, T4TOTAL, T3FREE, THYROIDAB in the last 72 hours.  Invalid input(s): FREET3 No results for input(s): VITAMINB12, FOLATE, FERRITIN, TIBC, IRON, RETICCTPCT in the last 72 hours.  Recent Labs  02/19/15 0934  LIPASE 32    Urine Studies No results for input(s): UHGB, CRYS in the last 72 hours.  Invalid input(s): UACOL, UAPR, USPG, UPH, UTP, UGL, UKET, UBIL, UNIT, UROB, ULEU, UEPI, UWBC, URBC, UBAC, CAST, UCOM, BILUA  MICROBIOLOGY: Recent Results (from the past 240 hour(s))  C difficile quick scan w PCR reflex     Status: None   Collection Time: 02/19/15  6:56 AM  Result Value Ref Range Status   C Diff antigen NEGATIVE NEGATIVE Final   C Diff toxin NEGATIVE NEGATIVE Final   C Diff interpretation Negative for toxigenic C. difficile  Final    Gastrointestinal Panel by PCR , Stool     Status: None   Collection Time: 02/19/15  6:56 AM  Result Value Ref Range Status   Campylobacter species NOT DETECTED NOT DETECTED Final   Plesimonas shigelloides NOT DETECTED NOT DETECTED Final   Salmonella species NOT DETECTED NOT DETECTED Final   Yersinia enterocolitica NOT DETECTED NOT DETECTED Final   Vibrio species NOT DETECTED NOT DETECTED Final   Vibrio cholerae NOT DETECTED NOT DETECTED Final   Enteroaggregative E coli (EAEC) NOT DETECTED NOT DETECTED Final   Enteropathogenic E coli (EPEC) NOT DETECTED NOT DETECTED Final   Enterotoxigenic E coli (ETEC) NOT DETECTED NOT DETECTED Final   Shiga like toxin producing E coli (STEC) NOT DETECTED NOT DETECTED Final   E. coli O157 NOT DETECTED NOT DETECTED Final   Shigella/Enteroinvasive  E coli (EIEC) NOT DETECTED NOT DETECTED Final   Cryptosporidium NOT DETECTED NOT DETECTED Final   Cyclospora cayetanensis NOT DETECTED NOT DETECTED Final   Entamoeba histolytica NOT DETECTED NOT DETECTED Final   Giardia lamblia NOT DETECTED NOT DETECTED Final   Adenovirus F40/41 NOT DETECTED NOT DETECTED Final   Astrovirus NOT DETECTED NOT DETECTED Final   Norovirus GI/GII NOT DETECTED NOT DETECTED Final   Rotavirus A NOT DETECTED NOT DETECTED Final   Sapovirus (I, II, IV, and V) NOT DETECTED NOT DETECTED Final    RADIOLOGY STUDIES/RESULTS: Ct Abdomen Pelvis Wo Contrast  02/19/2015  CLINICAL DATA:  Acute onset of nausea, vomiting and diarrhea. Mid abdominal pain, radiating from the xiphoid process down to the pelvis. Initial encounter. EXAM: CT ABDOMEN AND PELVIS WITHOUT CONTRAST TECHNIQUE: Multidetector CT imaging of the abdomen and pelvis was performed following the standard protocol without IV contrast. COMPARISON:  CT of the abdomen and pelvis performed 03/31/2014 FINDINGS: Minimal bibasilar atelectasis is noted. Diffuse coronary artery calcifications are seen. The liver and spleen are unremarkable in  appearance. A stone is noted within the gallbladder. The gallbladder is otherwise unremarkable. The pancreas and adrenal glands are unremarkable. Apparent mild right-sided hydronephrosis is noted, with mild right renal atrophy, but no evidence of distal obstructing stone. This may reflect an underlying stricture, or possibly a recently passed stone. The left kidney is unremarkable in appearance. No renal or ureteral stones are identified. No significant perinephric stranding is seen. No free fluid is identified. The small bowel is unremarkable in appearance. Clumping of bowel at the upper pelvis is thought to be transient in nature. The stomach is within normal limits. No acute vascular abnormalities are seen. Scattered calcification is noted along the abdominal aorta and its branches. The appendix is not definitely characterized; there is no evidence of appendicitis. The colon is unremarkable in appearance. The bladder is moderately distended and grossly unremarkable. The uterus is unremarkable in appearance. The ovaries are relatively symmetric. No suspicious adnexal masses are seen. No inguinal lymphadenopathy is seen. No acute osseous abnormalities are identified. Facet disease is noted along the lumbar spine. IMPRESSION: 1. Apparent mild right-sided hydronephrosis, with mild right renal atrophy, but no definite distal obstructing stone. This may reflect an underlying stricture, or possibly a recently passed stone. 2. Scattered calcification along the abdominal aorta and its branches. 3. Cholelithiasis.  Gallbladder otherwise unremarkable. 4. Diffuse coronary artery calcifications seen. Electronically Signed   By: Garald Balding M.D.   On: 02/19/2015 02:38    Oren Binet, MD  [facility] Pager:[phone]  If 7PM-7AM, please contact night-coverage [email or web address] Password [email or web address] 02/20/2015, 12:03 PM   LOS: 1 day

## 2015-02-20 NOTE — Consult Note (Addendum)
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  The patient is a poor historian.  She reports that she was tricked come into the hospital, which is why she did no present earlier for her symptoms.  On a daily basis she will have 5 diarrheal bowel movements per day.  It typically occurs after PO intake, but she can wake up with bowel movements.  There is no report of hematochezia or melena.  She does complain of some abdominal tenderness, but the CT scan in the ER was negative for any acute abnormalities. I cannot determine if she has had weight loss and she does not know anything about any new medications.    Past Medical History  Diagnosis Date  . History of colonic polyps   . GERD (gastroesophageal reflux disease)   . Hyperlipidemia   . Diabetes mellitus without mention of complication   . Hypertension   . Myocardial infarction ([place])   . Coronary artery disease   . Fatty liver disease, nonalcoholic   . Cholelithiasis   . Iron deficiency anemia   . Angina   . Shortness of breath   . Asthma   . Arthritis     HANDS"  . Cocaine abuse 07/2012    per [facility] drug screen  . History of intestinal surgery     Past Surgical History  Procedure Laterality Date  . Right colectomy      After colonic perforation  . Tubal ligation    . Coronary angioplasty with stent placement    . Esophagogastroduodenoscopy  11/30/2011    Procedure: ESOPHAGOGASTRODUODENOSCOPY (EGD);  Surgeon: Lafayette Dragon, MD;  Location: [facility];  Service: Endoscopy;  Laterality: N/A;  . Colonoscopy  11/30/2011    Procedure: COLONOSCOPY;  Surgeon: Lafayette Dragon, MD;  Location: [facility];  Service: Endoscopy;  Laterality: N/A;  . Left heart  catheterization with coronary angiogram N/A 02/20/2011    Procedure: LEFT HEART CATHETERIZATION WITH CORONARY ANGIOGRAM;  Surgeon: Clent Demark, MD;  Location: [facility] CATH LAB;  Service: Cardiovascular;  Laterality: N/A;  . Abdominal angiogram  02/20/2011    Procedure: ABDOMINAL ANGIOGRAM;  Surgeon: Clent Demark, MD;  Location: [facility] CATH LAB;  Service: Cardiovascular;;    Family History  Problem Relation Age of Onset  . Heart disease Mother   . Diabetes Mother   . Colon cancer Neg Hx   . Cancer Father     unsure what kind  . Diabetes Sister     Social History:  reports that she quit smoking about 4 years ago. Her smoking use included Cigarettes. She has a 23.5 pack-year smoking history. She has never used smokeless tobacco. She reports that she uses illicit drugs (Cocaine). She reports that she does not drink alcohol.  Allergies: No Known Allergies  Medications:  Scheduled: . albuterol  2.5 mg Nebulization BID  . ALPRAZolam  0.25 mg Oral BID  . aspirin EC  81 mg Oral Daily  . atorvastatin  20 mg Oral Daily  . atropine  1 drop Both Eyes Daily  . budesonide-formoterol  2 puff Inhalation BID  . Difluprednate  1 drop Both Eyes Daily  . enoxaparin (LOVENOX) injection  40 mg Subcutaneous Q24H  . gatifloxacin  1  drop Both Eyes QID  . insulin aspart  0-5 Units Subcutaneous QHS  . insulin aspart  0-9 Units Subcutaneous TID WC  . insulin glargine  20 Units Subcutaneous Q2200  . ketorolac  1 drop Both Eyes QID  . loperamide  2 mg Oral TID  . metoprolol succinate  25 mg Oral QPM  . pantoprazole  40 mg Oral Daily  . prednisoLONE acetate  1 drop Left Eye QID  . pregabalin  150 mg Oral BID  . sucralfate  1 g Oral TID WC & HS   Continuous: . sodium chloride 0.9 % 1,000 mL infusion 75 mL/hr at 02/19/15 1627    Results for orders placed or performed during the hospital encounter of 02/18/15 (from the past 24 hour(s))  Urine rapid drug screen (hosp performed)     Status: None   Collection  Time: 02/19/15  9:20 AM  Result Value Ref Range   Opiates NONE DETECTED NONE DETECTED   Cocaine NONE DETECTED NONE DETECTED   Benzodiazepines NONE DETECTED NONE DETECTED   Amphetamines NONE DETECTED NONE DETECTED   Tetrahydrocannabinol NONE DETECTED NONE DETECTED   Barbiturates NONE DETECTED NONE DETECTED  Basic metabolic panel     Status: Abnormal   Collection Time: 02/19/15  9:34 AM  Result Value Ref Range   Sodium 141 135 - 145 mmol/L   Potassium 4.3 3.5 - 5.1 mmol/L   Chloride 110 101 - 111 mmol/L   CO2 23 22 - 32 mmol/L   Glucose, Bld 114 (H) 65 - 99 mg/dL   BUN 17 6 - 20 mg/dL   Creatinine, Ser 1.39 (H) 0.44 - 1.00 mg/dL   Calcium 9.1 8.9 - 10.3 mg/dL   GFR calc non Af Amer 39 (L) >60 mL/min   GFR calc Af Amer 45 (L) >60 mL/min   Anion gap 8 5 - 15  CBC     Status: None   Collection Time: 02/19/15  9:34 AM  Result Value Ref Range   WBC 4.7 4.0 - 10.5 K/uL   RBC 4.28 3.87 - 5.11 MIL/uL   Hemoglobin 12.5 12.0 - 15.0 g/dL   HCT 39.3 36.0 - 46.0 %   MCV 91.8 78.0 - 100.0 fL   MCH 29.2 26.0 - 34.0 pg   MCHC 31.8 30.0 - 36.0 g/dL   RDW 15.3 11.5 - 15.5 %   Platelets 284 150 - 400 K/uL  Lipase, blood     Status: None   Collection Time: 02/19/15  9:34 AM  Result Value Ref Range   Lipase 32 11 - 51 U/L  Lactic acid, plasma     Status: None   Collection Time: 02/19/15  9:34 AM  Result Value Ref Range   Lactic Acid, Venous 1.1 0.5 - 2.0 mmol/L  POC CBG, ED     Status: Abnormal   Collection Time: 02/19/15 12:15 PM  Result Value Ref Range   Glucose-Capillary 128 (H) 65 - 99 mg/dL   Comment 1 Notify RN    Comment 2 Document in Chart   Lactic acid, plasma     Status: None   Collection Time: 02/19/15 12:53 PM  Result Value Ref Range   Lactic Acid, Venous 1.2 0.5 - 2.0 mmol/L  Glucose, capillary     Status: Abnormal   Collection Time: 02/19/15  4:55 PM  Result Value Ref Range   Glucose-Capillary 134 (H) 65 - 99 mg/dL  Glucose, capillary     Status: Abnormal   Collection  Time: 02/19/15  8:59 PM  Result Value Ref Range   Glucose-Capillary 118 (H) 65 - 99 mg/dL  CBC     Status: Abnormal   Collection Time: 02/20/15  3:37 AM  Result Value Ref Range   WBC 4.5 4.0 - 10.5 K/uL   RBC 4.09 3.87 - 5.11 MIL/uL   Hemoglobin 11.8 (L) 12.0 - 15.0 g/dL   HCT 37.5 36.0 - 46.0 %   MCV 91.7 78.0 - 100.0 fL   MCH 28.9 26.0 - 34.0 pg   MCHC 31.5 30.0 - 36.0 g/dL   RDW 15.5 11.5 - 15.5 %   Platelets 250 150 - 400 K/uL  Basic metabolic panel     Status: Abnormal   Collection Time: 02/20/15  3:37 AM  Result Value Ref Range   Sodium 146 (H) 135 - 145 mmol/L   Potassium 4.4 3.5 - 5.1 mmol/L   Chloride 115 (H) 101 - 111 mmol/L   CO2 25 22 - 32 mmol/L   Glucose, Bld 105 (H) 65 - 99 mg/dL   BUN 13 6 - 20 mg/dL   Creatinine, Ser 1.13 (H) 0.44 - 1.00 mg/dL   Calcium 9.0 8.9 - 10.3 mg/dL   GFR calc non Af Amer 50 (L) >60 mL/min   GFR calc Af Amer 57 (L) >60 mL/min   Anion gap 6 5 - 15  Glucose, capillary     Status: Abnormal   Collection Time: 02/20/15  6:13 AM  Result Value Ref Range   Glucose-Capillary 114 (H) 65 - 99 mg/dL     Ct Abdomen Pelvis Wo Contrast  02/19/2015  CLINICAL DATA:  Acute onset of nausea, vomiting and diarrhea. Mid abdominal pain, radiating from the xiphoid process down to the pelvis. Initial encounter. EXAM: CT ABDOMEN AND PELVIS WITHOUT CONTRAST TECHNIQUE: Multidetector CT imaging of the abdomen and pelvis was performed following the standard protocol without IV contrast. COMPARISON:  CT of the abdomen and pelvis performed 03/31/2014 FINDINGS: Minimal bibasilar atelectasis is noted. Diffuse coronary artery calcifications are seen. The liver and spleen are unremarkable in appearance. A stone is noted within the gallbladder. The gallbladder is otherwise unremarkable. The pancreas and adrenal glands are unremarkable. Apparent mild right-sided hydronephrosis is noted, with mild right renal atrophy, but no evidence of distal obstructing stone. This may  reflect an underlying stricture, or possibly a recently passed stone. The left kidney is unremarkable in appearance. No renal or ureteral stones are identified. No significant perinephric stranding is seen. No free fluid is identified. The small bowel is unremarkable in appearance. Clumping of bowel at the upper pelvis is thought to be transient in nature. The stomach is within normal limits. No acute vascular abnormalities are seen. Scattered calcification is noted along the abdominal aorta and its branches. The appendix is not definitely characterized; there is no evidence of appendicitis. The colon is unremarkable in appearance. The bladder is moderately distended and grossly unremarkable. The uterus is unremarkable in appearance. The ovaries are relatively symmetric. No suspicious adnexal masses are seen. No inguinal lymphadenopathy is seen. No acute osseous abnormalities are identified. Facet disease is noted along the lumbar spine. IMPRESSION: 1. Apparent mild right-sided hydronephrosis, with mild right renal atrophy, but no definite distal obstructing stone. This may reflect an underlying stricture, or possibly a recently passed stone. 2. Scattered calcification along the abdominal aorta and its branches. 3. Cholelithiasis.  Gallbladder otherwise unremarkable. 4. Diffuse coronary artery calcifications seen. Electronically Signed   By: Garald Balding M.D.   On: 02/19/2015 02:38  ROS:  As stated above in the HPI otherwise negative.  Blood pressure 126/51, pulse 57, temperature 98.3 F (36.8 C), temperature source Oral, resp. rate 16, SpO2 94 %.    PE: Gen: NAD, Alert and Oriented HEENT:  [place]/AT, EOMI Neck: Supple, no LAD Lungs: CTA Bilaterally CV: RRR without M/G/R ABM: Soft, NTND, +BS Ext: No C/C/E  Assessment/Plan: 1) Diarrhea. 2) ABM pain. 3) Mild renal insufficiency - improved.   Her last colonoscopy was with Dr. Olevia Perches on 11/2011 and an EGD was performed at that time.  She was only  identified to have hyperplastic polyps.  In fact, the only time she had adenomatous polyps was during the 2009 colonoscopy.  Overnight no stooling was recorded and with her poor history I am not certain about the severity of her symptoms.  An EGD/Colonoscopy would not be unreasonable to evaluate her diarrhea, if it is severe, but I cannot truly determine the severity.  Her stool pathogen and C. Diff PCR are normal.    Plan: 1) Check stool osm, Na, and K to determine the type of diarrhea. 2) Hold on endoscopic procedures at this time.  I will discuss the case with Dr. Carlean Purl as he will decide if she needs further work up. 3) Hold antidiarrheals to determine the extent of her diarrhea.  [name] D 02/20/2015, 7:06 AM

## 2015-02-20 NOTE — Progress Notes (Signed)
Family (daughter and son) at the bedside.

## 2015-02-20 NOTE — Progress Notes (Signed)
Patient has not had a bowel movement this shift.  Patient does have complaints of epigastric discomfort.

## 2015-02-21 ENCOUNTER — Inpatient Hospital Stay (HOSPITAL_COMMUNITY): Payer: Medicare Other

## 2015-02-21 DIAGNOSIS — K219 Gastro-esophageal reflux disease without esophagitis: Secondary | ICD-10-CM

## 2015-02-21 DIAGNOSIS — R197 Diarrhea, unspecified: Secondary | ICD-10-CM

## 2015-02-21 DIAGNOSIS — R1013 Epigastric pain: Secondary | ICD-10-CM | POA: Insufficient documentation

## 2015-02-21 LAB — GLUCOSE, CAPILLARY
GLUCOSE-CAPILLARY: 103 mg/dL — AB (ref 65–99)
GLUCOSE-CAPILLARY: 133 mg/dL — AB (ref 65–99)
Glucose-Capillary: 91 mg/dL (ref 65–99)
Glucose-Capillary: 142 mg/dL — ABNORMAL HIGH (ref 65–99)

## 2015-02-21 LAB — BASIC METABOLIC PANEL
Anion gap: 8 (ref 5–15)
BUN: 6 mg/dL (ref 6–20)
CHLORIDE: 114 mmol/L — AB (ref 101–111)
CO2: 21 mmol/L — AB (ref 22–32)
CREATININE: 1.04 mg/dL — AB (ref 0.44–1.00)
Calcium: 9.2 mg/dL (ref 8.9–10.3)
GFR calc Af Amer: >60 mL/min (ref >60)
GFR calc non Af Amer: 55 mL/min — ABNORMAL LOW (ref >60)
Glucose, Bld: 89 mg/dL (ref 65–99)
Potassium: 4.2 mmol/L (ref 3.5–5.1)
Sodium: 143 mmol/L (ref 135–145)

## 2015-02-21 LAB — TROPONIN I

## 2015-02-21 LAB — HEMOGLOBIN A1C
Hgb A1c MFr Bld: 6.1 % — ABNORMAL HIGH (ref 4.8–5.6)
Mean Plasma Glucose: 128 mg/dL

## 2015-02-21 MED ORDER — AMLODIPINE BESYLATE 5 MG PO TABS
5.0000 mg | ORAL_TABLET | Freq: Every day | ORAL | Status: DC
  Administered 2015-02-21 – 2015-02-24 (×4): 5 mg via ORAL
  Filled 2015-02-21 (×4): qty 1

## 2015-02-21 MED ORDER — ENSURE ENLIVE PO LIQD
237.0000 mL | Freq: Two times a day (BID) | ORAL | Status: DC
  Administered 2015-02-23 – 2015-02-24 (×3): 237 mL via ORAL

## 2015-02-21 NOTE — Progress Notes (Signed)
[facility] PROGRESS NOTE  [name] [id number] DOB: November 21, 1950 DOA: 02/18/2015 PCP: Elyn Peers, MD  Subjective: Continues to complain of epigastric pain, stuttering speech (baseline per patient). Reports having one small bowel movement yesterday-but no diarrhea.  Assessment/Plan: Active Problems: Diarrhea: Ongoing for the past 2-3 months. Colonoscopy/EGD/Capsule Endo 2013 negative for significant abnormalities. C. difficile PCR and GI pathogen panel seen by gastroenterology-recommendations are to hold off on starting antidiarrheal agents-await further recommendations from gastroenterology. Have asked RN to document number of stools in chart- one small bowel movement yesterday    Epigastric Pain: tender in upper abd area-but soft-continue PPI/Carafate. EKG neg, will check trop.Check Abd xray  Acute on chronic kidney disease stage III: ARF secondary to prerenal azotemia in the setting of diarrhea/dehydration. Improving with IV fluids, creatinine continues to decrease.   Hypernatremia: resolved, stop IVF. Encourage oral intake.   Hyperchloremia: follow-likely from diarrhea  Hypertension: uncontrolled-restart Amlodipine, continue metoprolol-follow  History of CAD: Currently without chest pain or shortness of breath. Continue aspirin, statin and metoprolol.  Type 2 Diabetes: CBGs currently stable with 20 units of Lantus and SSI. Resume metformin on discharge. Follow and adjust accordingly  Dyslipidemia: Continue statin  Prior history of cocaine abuse: UDS 1/14 negative.  Anxiety: Appears anxious, but denies any depression-like symptoms to me. Continue Xanax as needed.  Bronchial asthma: Lungs clear, this appears stable. Continue bronchodilators.  GERD: Continue PPI/Carafate  Code Status: Full code Family Communication: None at bedside Disposition Plan: Remain inpatient   Consultants:  GI  Procedures:  None  Antibiotics:  None    Objective: Filed  Vitals:   02/21/15 0538 02/21/15 0954  BP: 168/81 159/66  Pulse: 56   Temp: 98.7 F (37.1 C)   Resp: 16     Intake/Output Summary (Last 24 hours) at 02/21/15 1101 Last data filed at 02/21/15 0900  Gross per 24 hour  Intake   1440 ml  Output      6 ml  Net   1434 ml   There were no vitals filed for this visit.  Exam:   General: Awake and alert, stuttering speech (baseline per patient)  HEENT: Supple, no JVD  Cardiovascular: Regular rate and rhythm, no murmurs  Respiratory: Clear to auscultation bilaterally  Abdomen: Soft, epigastric area mildly tender to palpation  Musculoskeletal: Lower extremity strength 3/5, no edema  Data Reviewed: Basic Metabolic Panel:  Recent Labs Lab 02/18/15 1810 02/19/15 0934 02/20/15 0337 02/21/15 0550  NA 142 141 146* 143  K 4.4 4.3 4.4 4.2  CL 109 110 115* 114*  CO2 21* 23 25 21*  GLUCOSE 91 114* 105* 89  BUN 23* 17 13 6   CREATININE 1.67* 1.39* 1.13* 1.04*  CALCIUM 9.7 9.1 9.0 9.2   Liver Function Tests:  Recent Labs Lab 02/18/15 1810  AST 16  ALT 11*  ALKPHOS 81  BILITOT 0.8  PROT 8.1  ALBUMIN 3.7    Recent Labs Lab 02/19/15 0934  LIPASE 32   No results for input(s): AMMONIA in the last 168 hours. CBC:  Recent Labs Lab 02/18/15 1810 02/19/15 0934 02/20/15 0337  WBC 7.3 4.7 4.5  HGB 13.7 12.5 11.8*  HCT 42.3 39.3 37.5  MCV 89.6 91.8 91.7  PLT 332 284 250   Cardiac Enzymes: No results for input(s): CKTOTAL, CKMB, CKMBINDEX, TROPONINI in the last 168 hours. BNP (last 3 results)  Recent Labs  06/06/14 1450  BNP 19.2    ProBNP (last 3 results) No results for input(s): PROBNP in the  last 8760 hours.  CBG:  Recent Labs Lab 02/20/15 0613 02/20/15 1202 02/20/15 1641 02/20/15 2141 02/21/15 0605  GLUCAP 114* 87 84 99 91    Recent Results (from the past 240 hour(s))  Culture, Urine     Status: None   Collection Time: 02/18/15  6:06 PM  Result Value Ref Range Status   Specimen Description  URINE, RANDOM  Final   Special Requests NONE  Final   Culture MULTIPLE SPECIES PRESENT, SUGGEST RECOLLECTION  Final   Report Status 02/20/2015 FINAL  Final  C difficile quick scan w PCR reflex     Status: None   Collection Time: 02/19/15  6:56 AM  Result Value Ref Range Status   C Diff antigen NEGATIVE NEGATIVE Final   C Diff toxin NEGATIVE NEGATIVE Final   C Diff interpretation Negative for toxigenic C. difficile  Final  Gastrointestinal Panel by PCR , Stool     Status: None   Collection Time: 02/19/15  6:56 AM  Result Value Ref Range Status   Campylobacter species NOT DETECTED NOT DETECTED Final   Plesimonas shigelloides NOT DETECTED NOT DETECTED Final   Salmonella species NOT DETECTED NOT DETECTED Final   Yersinia enterocolitica NOT DETECTED NOT DETECTED Final   Vibrio species NOT DETECTED NOT DETECTED Final   Vibrio cholerae NOT DETECTED NOT DETECTED Final   Enteroaggregative E coli (EAEC) NOT DETECTED NOT DETECTED Final   Enteropathogenic E coli (EPEC) NOT DETECTED NOT DETECTED Final   Enterotoxigenic E coli (ETEC) NOT DETECTED NOT DETECTED Final   Shiga like toxin producing E coli (STEC) NOT DETECTED NOT DETECTED Final   E. coli O157 NOT DETECTED NOT DETECTED Final   Shigella/Enteroinvasive E coli (EIEC) NOT DETECTED NOT DETECTED Final   Cryptosporidium NOT DETECTED NOT DETECTED Final   Cyclospora cayetanensis NOT DETECTED NOT DETECTED Final   Entamoeba histolytica NOT DETECTED NOT DETECTED Final   Giardia lamblia NOT DETECTED NOT DETECTED Final   Adenovirus F40/41 NOT DETECTED NOT DETECTED Final   Astrovirus NOT DETECTED NOT DETECTED Final   Norovirus GI/GII NOT DETECTED NOT DETECTED Final   Rotavirus A NOT DETECTED NOT DETECTED Final   Sapovirus (I, II, IV, and V) NOT DETECTED NOT DETECTED Final     Studies: No results found.  Scheduled Meds: . albuterol  2.5 mg Nebulization BID  . ALPRAZolam  0.25 mg Oral BID  . aspirin EC  81 mg Oral Daily  . atorvastatin  20 mg  Oral Daily  . atropine  1 drop Both Eyes Daily  . budesonide-formoterol  2 puff Inhalation BID  . Difluprednate  1 drop Both Eyes Daily  . enoxaparin (LOVENOX) injection  40 mg Subcutaneous Q24H  . gatifloxacin  1 drop Both Eyes QID  . insulin aspart  0-5 Units Subcutaneous QHS  . insulin aspart  0-9 Units Subcutaneous TID WC  . insulin glargine  20 Units Subcutaneous Q2200  . ketorolac  1 drop Both Eyes QID  . metoprolol succinate  25 mg Oral QPM  . pantoprazole  40 mg Oral BID AC  . prednisoLONE acetate  1 drop Left Eye QID  . pregabalin  150 mg Oral BID  . sucralfate  1 g Oral TID WC & HS   Continuous Infusions:   Active Problems:   Essential hypertension   Dehydration   Acute on chronic renal failure ([place])  Time spent: 35 minutes  Signed:  Carlena Hurl, PA-S  [facility] If 7PM-7AM, please contact night-coverage at [email or web address], password [facility]  02/21/2015, 11:01 AM  LOS: 2 days     Attending MD note  Patient was seen, examined,treatment plan was discussed with the PA-S.  I have personally reviewed the clinical findings, lab, imaging studies and management of this patient in detail. I agree with the documentation, as recorded by thePA-S.   No further diarrhea-last bowel movement was yesterday evening. Now complaining of epigastric pain-already on PPI/Carafate. Abdomen is soft-with tenderness in the upper abdomen. EKG nonacute, check troponins and abdominal x-ray. Await GI evaluation. Spoke with son over the phone   [facility]

## 2015-02-21 NOTE — Progress Notes (Addendum)
Initial Nutrition Assessment  DOCUMENTATION CODES:   Not applicable  INTERVENTION:  Provide Ensure Enlive po BID, each supplement provides 350 kcal and 20 grams of protein.  Recommend obtaining new weight to fully assess weight trends.   Encourage adequate PO intake.   NUTRITION DIAGNOSIS:   Inadequate oral intake related to poor appetite as evidenced by meal completion < 50%.  GOAL:   Patient will meet greater than or equal to 90% of their needs  MONITOR:   PO intake, Supplement acceptance, Weight trends, Labs, I & O's  REASON FOR ASSESSMENT:   Malnutrition Screening Tool    ASSESSMENT:   65 year old female with a history of hyperlipidemia, diabetes mellitus type 2, hypertension, coronary artery disease, asthma presented with diarrhea and abdominal pain. Unfortunately, the patient is not a good historian. She states that she has had diarrhea and abdominal pain for 3-4 months, but it has worsened in the past few weeks.  Diet has been advanced to a carb mod diet. Meal completion has been 25-50%. Family at bedside reports pt was eating well PTA with no other difficulties. Usual body weight reported by pt is ~145 lbs. Noted, no recent weight recorded. RD attempted to weigh pt on bed scale during time of visit, however bed scale did not work. RD was notified to weigh pt to obtain new weight to fully assess weight trends. Pt is agreeable to nutritional supplements to aid in caloric and protein needs. RD to order.   Nutrition-Focused physical exam completed. Findings are no fat depletion, moderate muscle depletion, and no edema.   Labs and medications reviewed.   Diet Order:  Diet Carb Modified Fluid consistency:: Thin; Room service appropriate?: Yes  Skin:  Reviewed, no issues  Last BM:  1/14  Height:   Ht Readings from Last 1 Encounters:  04/01/14 5\' 3"  (1.6 m)    Weight:   Wt Readings from Last 1 Encounters:  04/02/14 139 lb 15.9 oz (63.5 kg)    Ideal Body  Weight:  52.27 kg  BMI:  There is no weight on file to calculate BMI.  Estimated Nutritional Needs:   Kcal:  1600-1800  Protein:  75-85 grams  Fluid:  1.6 - 1.8 L/day  EDUCATION NEEDS:   No education needs identified at this time  Corrin Parker, MS, RD, LDN Pager # [phone] After hours/ weekend pager # [phone]

## 2015-02-21 NOTE — Progress Notes (Signed)
Pt. Has been complaining about epigastric/chest pain.Family at the bed side,VS were checked,MD has been notified,orders were received.Continue monitoring pt. Closely and assessing her needs.

## 2015-02-21 NOTE — Progress Notes (Signed)
Daily Rounding Note  02/21/2015, 10:12 AM  LOS: 2 days  GI PMD:  Previously Dr Olevia Perches  SUBJECTIVE:       No BMs recorded, last was 1/14.  Has not received antidiarrheals since admission.  C/o epigastric pain, MD ordered Troponins due to concern this is cardiac chest pain.   OBJECTIVE:         Vital signs in last 24 hours:    Temp:  [98.4 F (36.9 C)-98.7 F (37.1 C)] 98.7 F (37.1 C) (01/16 0538) Pulse Rate:  [56-71] 56 (01/16 0538) Resp:  [16-17] 16 (01/16 0538) BP: (131-181)/(57-81) 159/66 mmHg (01/16 0954) SpO2:  [96 %-99 %] 99 % (01/16 0954) Last BM Date: 02/19/15 There were no vitals filed for this visit. General: thin, frail, chronically ill looking   Heart: RRR Chest: clear bil.  No cough or dyspnea.   Abdomen: soft, somewhat distended, mild diffuse tendernees across upper abdomnen bil.    Extremities: no CCE Neuro/Psych:  Severe stutter, alert.  Oriented x 3.  Appropriate.   Intake/Output from previous day: 01/15 0701 - 01/16 0700 In: 1560 [P.O.:1560] Out: 7 [Urine:6; Stool:1]  Intake/Output this shift: Total I/O In: 360 [P.O.:360] Out: 1 [Urine:1]  Lab Results:  Recent Labs  02/18/15 1810 02/19/15 0934 02/20/15 0337  WBC 7.3 4.7 4.5  HGB 13.7 12.5 11.8*  HCT 42.3 39.3 37.5  PLT 332 284 250   BMET  Recent Labs  02/19/15 0934 02/20/15 0337 02/21/15 0550  NA 141 146* 143  K 4.3 4.4 4.2  CL 110 115* 114*  CO2 23 25 21*  GLUCOSE 114* 105* 89  BUN 17 13 6   CREATININE 1.39* 1.13* 1.04*  CALCIUM 9.1 9.0 9.2   LFT  Recent Labs  02/18/15 1810  PROT 8.1  ALBUMIN 3.7  AST 16  ALT 11*  ALKPHOS 81  BILITOT 0.8     ASSESMENT:   *  Diarrhea and abdominal pain,  Pain present for years; 3 - 4 months diarrhea..  Normal Lipase/LFTs.  C diff negative. FOBT negative. Diarrhea resolved.   02/2007 Colonoscopy: adenomatous colon polyps (3 adenomatous, 1 HP) and right colectomy for post  polypectomy perf (path: colon infarction/inflammation and abscess).  2011 Colonoscopy: HP polyps.   11/2011 Colon/EGD: for FOBT +:  EGD normal, 2 HP polyps.  02/19/15 CT: right hydronephrosis, cholelithiasis, CAD, Abd aortic and branch vessel calcifications. 02/21/2015 2V abdomen: moderate stool burden.    Home meds: Carafate, Lomotil, once daily Protonix.   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   *  CKD.    *  Type 2 DM: on Insulin/Metformin at home.    PLAN   *  Per Dr Carlean Purl.  ? EGD and/or colonoscopy?  *  Advance to CM diet.   *  Given resolution of diarrhea, will cxl stool electrolyte orders.     Azucena Freed  02/21/2015, 10:12 AM Pager: [phone]    [facility] Attending   I have taken an interval history, reviewed the chart and examined the patient. I agree with the Advanced Practitioner's note, impression and recommendations.   Chronic diarrhea sxs w/ acute worsening it seems - ? IBS and acute (resolved) gastroenteritis. Abd pain could be abd wall - CT reassuring.  Diarrhea has stopped.  Not sure she needs endoscopic evaluation - will reassess tomorrow. May need to be checked for impaction.  Gatha Mayer, MD, [facility] [phone] (pager) [phone] after 5 PM,  weekends and holidays  02/21/2015 6:00 PM

## 2015-02-22 ENCOUNTER — Encounter (HOSPITAL_COMMUNITY): Payer: Self-pay | Admitting: Physician Assistant

## 2015-02-22 DIAGNOSIS — K589 Irritable bowel syndrome without diarrhea: Secondary | ICD-10-CM

## 2015-02-22 LAB — GLUCOSE, CAPILLARY
GLUCOSE-CAPILLARY: 122 mg/dL — AB (ref 65–99)
GLUCOSE-CAPILLARY: 130 mg/dL — AB (ref 65–99)
Glucose-Capillary: 151 mg/dL — ABNORMAL HIGH (ref 65–99)
Glucose-Capillary: 101 mg/dL — ABNORMAL HIGH (ref 65–99)

## 2015-02-22 LAB — FECAL LACTOFERRIN, QUANT: Fecal Lactoferrin: NEGATIVE

## 2015-02-22 NOTE — Progress Notes (Signed)
Daily Rounding Note  02/22/2015, 10:22 AM  LOS: 3 days   SUBJECTIVE:       Still C/o epigastric pain, radiating to umbilicus.    Now details hx as the pain started after colectomy in 2009 .  BM last night.  Hydrocodone 10 mg not helping all that much.   OBJECTIVE:         Vital signs in last 24 hours:    Temp:  [98.2 F (36.8 C)-99.1 F (37.3 C)] 98.2 F (36.8 C) (01/17 0500) Pulse Rate:  [63-77] 63 (01/17 0500) Resp:  [16-18] 18 (01/17 0500) BP: (116-147)/(54-64) 124/54 mmHg (01/17 0500) SpO2:  [92 %-98 %] 94 % (01/17 0635) Last BM Date: 02/21/15 There were no vitals filed for this visit. General: somewhat uncomfortable, chronically ill looking.     Heart: RRR. Chest: clear bil.  No cough or dyspnea Abdomen: soft, tender in epigastrum and upper abdomen.  BS hypoactive but no tinkling or tympanitic BS Extremities: no CCE Neuro/Psych:  Moves all 4s, pronounced stutter but oriented x 3.     Lab Results:  Recent Labs  02/20/15 0337  WBC 4.5  HGB 11.8*  HCT 37.5  PLT 250   BMET  Recent Labs  02/20/15 0337 02/21/15 0550  NA 146* 143  K 4.4 4.2  CL 115* 114*  CO2 25 21*  GLUCOSE 105* 89  BUN 13 6  CREATININE 1.13* 1.04*  CALCIUM 9.0 9.2     ASSESMENT:   * Diarrhea and abdominal pain, Pain present for years; 3 - 4 months diarrhea: resolved since admitted.  Normal Lipase/LFTs. C diff negative. FOBT negative.  02/2007 Colonoscopy: adenomatous colon polyps (3 adenomatous, 1 HP) and right colectomy for post polypectomy perf (path: colon infarction/inflammation and abscess).  2011 Colonoscopy: HP polyps.  11/2011 Colon/EGD: for FOBT +: EGD normal, 2 HP polyps.  02/19/15 CT: right hydronephrosis, cholelithiasis, CAD, Abd aortic and branch vessel calcifications. 02/21/2015 2V abdomen: moderate stool burden.  Home meds: Carafate, Lomotil, once daily Protonix.   * Esophageal dysmotility per 03/1011  EGD.   * CKD.   * Type 2 DM: on Insulin/Metformin at home.     PLAN   *  Decision re EGD per Dr Carlean Purl.  Likely to be low yield. ? HIDA scan to further assess GB and cholelithiasis (despite multiple normal Lipases and LFTs     *  GI going forward will be assigned, TBD.      Azucena Freed  02/22/2015, 10:22 AM Pager: [phone]    [facility] Attending   I have taken an interval history, reviewed the chart and examined the patient. I agree with the Advanced Practitioner's note, impression and recommendations.   Seems a little better today - has chronic post-prandial epigastric pain down to umbilicus. ? In part muscle/abd wall.  Diarrhea resolved. Rectal w/ Katie young PA-S present - small amount formed stool. [place].  Probably has IBS/functional dyspepsia, ? Autonomic neuropathy. Given situation I think EGD reasonable she is ok w/ that. The risks and benefits as well as alternatives of endoscopic procedure(s) have been discussed and reviewed. All questions answered. The patient agrees to proceed. Scheduled for 1100 tomorrow  Wt Readings from Last 3 Encounters:  04/02/14 139 lb 15.9 oz (63.5 kg)  10/24/12 146 lb 8 oz (66.452 kg)  08/04/12 140 lb 10.5 oz (63.8 kg)   She said she has been losing weight - but not so.  Defer to IM  re: does hydronephrosis need more w/u.  Gatha Mayer, MD, [facility] [phone] (pager) [phone] after 5 PM, weekends and holidays  02/22/2015 12:33 PM

## 2015-02-22 NOTE — Progress Notes (Signed)
Pt has not had a bowel movement this shift. Pt has continued complaints of epigastric pain.

## 2015-02-22 NOTE — Progress Notes (Signed)
[facility] PROGRESS NOTE  [name] [id number] DOB: [birth year] DOA: 02/18/2015 PCP: Elyn Peers, MD  Subjective: Continues to complain of epigastric pain. No diarrhea  Assessment/Plan: Active Problems: Diarrhea: Seems to have resolved. Ongoing for the past 2-3 months. Colonoscopy/EGD/Capsule Endo 2013 negative for significant abnormalities. C. difficile PCR and GI pathogen panel negative. Suspect she has IBS.GI following.  Epigastric Pain: tender in upper abd area-but soft-continue PPI/Carafate. Doubt cardiac cause-EKG/Trop neg. GI planning on EGD tomorrow.  Acute on chronic kidney disease stage III: ARF secondary to prerenal azotemia in the setting of diarrhea/dehydration. Improved with IV fluids, creatinine continues to decrease.   Hypernatremia: resolved, stop IVF. Encourage oral intake.   Hyperchloremia: follow-likely from diarrhea  Hypertension: uncontrolled-restart Amlodipine, continue metoprolol-follow  History of CAD: Currently without chest pain or shortness of breath. Continue aspirin, statin and metoprolol.  Type 2 Diabetes: CBGs currently stable with 20 units of Lantus and SSI. Resume metformin on discharge. Follow and adjust accordingly  Dyslipidemia: Continue statin  Prior history of cocaine abuse: UDS 1/14 negative.  Anxiety: Appears anxious, but denies any depression-like symptoms to me. Continue Xanax as needed.  Bronchial asthma: Lungs clear, this appears stable. Continue bronchodilators.  GERD: Continue PPI/Carafate  Code Status: Full code Family Communication: None at bedside Disposition Plan: Remain inpatient   Consultants:  GI  Procedures:  None  Antibiotics:  None    Objective: Filed Vitals:   02/22/15 0500 02/22/15 1243  BP: 124/54 132/50  Pulse: 63 76  Temp: 98.2 F (36.8 C) 98.7 F (37.1 C)  Resp: 18 18    Intake/Output Summary (Last 24 hours) at 02/22/15 1247 Last data filed at 02/22/15 0900  Gross per 24  hour  Intake    720 ml  Output      5 ml  Net    715 ml   There were no vitals filed for this visit.  Exam:   General: Awake and alert, stuttering speech (baseline per patient)  HEENT: Supple, no JVD  Cardiovascular: Regular rate and rhythm, no murmurs  Respiratory: Clear to auscultation bilaterally  Abdomen: Soft, epigastric area mildly tender to palpation  Musculoskeletal: Lower extremity strength 3/5, no edema  Data Reviewed: Basic Metabolic Panel:  Recent Labs Lab 02/18/15 1810 02/19/15 0934 02/20/15 0337 02/21/15 0550  NA 142 141 146* 143  K 4.4 4.3 4.4 4.2  CL 109 110 115* 114*  CO2 21* 23 25 21*  GLUCOSE 91 114* 105* 89  BUN 23* 17 13 6   CREATININE 1.67* 1.39* 1.13* 1.04*  CALCIUM 9.7 9.1 9.0 9.2   Liver Function Tests:  Recent Labs Lab 02/18/15 1810  AST 16  ALT 11*  ALKPHOS 81  BILITOT 0.8  PROT 8.1  ALBUMIN 3.7    Recent Labs Lab 02/19/15 0934  LIPASE 32   No results for input(s): AMMONIA in the last 168 hours. CBC:  Recent Labs Lab 02/18/15 1810 02/19/15 0934 02/20/15 0337  WBC 7.3 4.7 4.5  HGB 13.7 12.5 11.8*  HCT 42.3 39.3 37.5  MCV 89.6 91.8 91.7  PLT 332 284 250   Cardiac Enzymes:  Recent Labs Lab 02/21/15 1430  TROPONINI <0.03   BNP (last 3 results)  Recent Labs  06/06/14 1450  BNP 19.2    ProBNP (last 3 results) No results for input(s): PROBNP in the last 8760 hours.  CBG:  Recent Labs Lab 02/21/15 1106 02/21/15 1554 02/21/15 2216 02/22/15 0613 02/22/15 1107  GLUCAP 103* 133* 142* 122* 130*  Recent Results (from the past 240 hour(s))  Culture, Urine     Status: None   Collection Time: 02/18/15  6:06 PM  Result Value Ref Range Status   Specimen Description URINE, RANDOM  Final   Special Requests NONE  Final   Culture MULTIPLE SPECIES PRESENT, SUGGEST RECOLLECTION  Final   Report Status 02/20/2015 FINAL  Final  C difficile quick scan w PCR reflex     Status: None   Collection Time: 02/19/15   6:56 AM  Result Value Ref Range Status   C Diff antigen NEGATIVE NEGATIVE Final   C Diff toxin NEGATIVE NEGATIVE Final   C Diff interpretation Negative for toxigenic C. difficile  Final  Gastrointestinal Panel by PCR , Stool     Status: None   Collection Time: 02/19/15  6:56 AM  Result Value Ref Range Status   Campylobacter species NOT DETECTED NOT DETECTED Final   Plesimonas shigelloides NOT DETECTED NOT DETECTED Final   Salmonella species NOT DETECTED NOT DETECTED Final   Yersinia enterocolitica NOT DETECTED NOT DETECTED Final   Vibrio species NOT DETECTED NOT DETECTED Final   Vibrio cholerae NOT DETECTED NOT DETECTED Final   Enteroaggregative E coli (EAEC) NOT DETECTED NOT DETECTED Final   Enteropathogenic E coli (EPEC) NOT DETECTED NOT DETECTED Final   Enterotoxigenic E coli (ETEC) NOT DETECTED NOT DETECTED Final   Shiga like toxin producing E coli (STEC) NOT DETECTED NOT DETECTED Final   E. coli O157 NOT DETECTED NOT DETECTED Final   Shigella/Enteroinvasive E coli (EIEC) NOT DETECTED NOT DETECTED Final   Cryptosporidium NOT DETECTED NOT DETECTED Final   Cyclospora cayetanensis NOT DETECTED NOT DETECTED Final   Entamoeba histolytica NOT DETECTED NOT DETECTED Final   Giardia lamblia NOT DETECTED NOT DETECTED Final   Adenovirus F40/41 NOT DETECTED NOT DETECTED Final   Astrovirus NOT DETECTED NOT DETECTED Final   Norovirus GI/GII NOT DETECTED NOT DETECTED Final   Rotavirus A NOT DETECTED NOT DETECTED Final   Sapovirus (I, II, IV, and V) NOT DETECTED NOT DETECTED Final     Studies: Dg Abd 2 Views  02/21/2015  CLINICAL DATA:  Abdominal pain for a few days EXAM: ABDOMEN - 2 VIEW COMPARISON:  CT 02/19/2015 FINDINGS: Moderate stool burden throughout the colon. No evidence of bowel obstruction or free air. No organomegaly. Oral contrast material noted within the left side of the colon. No acute bony abnormality. IMPRESSION: Moderate stool burden.  No obstruction or free air.  Electronically Signed   By: Rolm Baptise M.D.   On: 02/21/2015 13:43    Scheduled Meds: . albuterol  2.5 mg Nebulization BID  . ALPRAZolam  0.25 mg Oral BID  . amLODipine  5 mg Oral Daily  . aspirin EC  81 mg Oral Daily  . atorvastatin  20 mg Oral Daily  . atropine  1 drop Both Eyes Daily  . budesonide-formoterol  2 puff Inhalation BID  . Difluprednate  1 drop Both Eyes Daily  . enoxaparin (LOVENOX) injection  40 mg Subcutaneous Q24H  . feeding supplement (ENSURE ENLIVE)  237 mL Oral BID BM  . gatifloxacin  1 drop Both Eyes QID  . insulin aspart  0-5 Units Subcutaneous QHS  . insulin aspart  0-9 Units Subcutaneous TID WC  . insulin glargine  20 Units Subcutaneous Q2200  . ketorolac  1 drop Both Eyes QID  . metoprolol succinate  25 mg Oral QPM  . pantoprazole  40 mg Oral BID AC  . prednisoLONE acetate  1 drop Left Eye QID  . pregabalin  150 mg Oral BID  . sucralfate  1 g Oral TID WC & HS   Continuous Infusions:   Active Problems:   Essential hypertension   Dehydration   Acute on chronic renal failure (HCC)   Abdominal pain, epigastric  Time spent: 35 minutes  [facility]   If 7PM-7AM, please contact night-coverage at [email or web address], password [facility] 02/22/2015, 12:47 PM  LOS: 3 days

## 2015-02-22 NOTE — Care Management Important Message (Signed)
Important Message  Patient Details  Name: MCKAYLEE MANDELLA MRN: [id number] Date of Birth: [birth year]   Medicare Important Message Given:  Yes    Spence Soberano P Karinda Cabriales 02/22/2015, 3:29 PM

## 2015-02-23 ENCOUNTER — Inpatient Hospital Stay (HOSPITAL_COMMUNITY): Payer: Medicare Other | Admitting: Certified Registered Nurse Anesthetist

## 2015-02-23 ENCOUNTER — Encounter (HOSPITAL_COMMUNITY): Payer: Self-pay | Admitting: Certified Registered Nurse Anesthetist

## 2015-02-23 ENCOUNTER — Encounter (HOSPITAL_COMMUNITY)
Admission: EM | Disposition: A | Discharge status: Home / Self Care | Payer: Self-pay | Source: Home / Self Care | Attending: Internal Medicine

## 2015-02-23 DIAGNOSIS — E860 Dehydration: Secondary | ICD-10-CM

## 2015-02-23 HISTORY — PX: ESOPHAGOGASTRODUODENOSCOPY: SHX5428

## 2015-02-23 LAB — GLUCOSE, CAPILLARY
GLUCOSE-CAPILLARY: 82 mg/dL (ref 65–99)
GLUCOSE-CAPILLARY: 102 mg/dL — AB (ref 65–99)
Glucose-Capillary: 78 mg/dL (ref 65–99)
Glucose-Capillary: 121 mg/dL — ABNORMAL HIGH (ref 65–99)
Glucose-Capillary: 116 mg/dL — ABNORMAL HIGH (ref 65–99)

## 2015-02-23 SURGERY — EGD (ESOPHAGOGASTRODUODENOSCOPY)
Anesthesia: Monitor Anesthesia Care

## 2015-02-23 MED ORDER — LACTATED RINGERS IV SOLN
INTRAVENOUS | Status: DC
  Administered 2015-02-23: 11:00:00 via INTRAVENOUS

## 2015-02-23 MED ORDER — BUTAMBEN-TETRACAINE-BENZOCAINE 2-2-14 % EX AERO
INHALATION_SPRAY | CUTANEOUS | Status: DC | PRN
  Administered 2015-02-23: 2 via TOPICAL

## 2015-02-23 MED ORDER — HOMATROPINE HBR 5 % OP SOLN
1.0000 [drp] | Freq: Every day | OPHTHALMIC | Status: DC
  Administered 2015-02-24: 1 [drp] via OPHTHALMIC
  Filled 2015-02-23: qty 5

## 2015-02-23 MED ORDER — PROPOFOL 500 MG/50ML IV EMUL
INTRAVENOUS | Status: DC | PRN
  Administered 2015-02-23: 50 ug/kg/min via INTRAVENOUS

## 2015-02-23 MED ORDER — PREDNISOLONE ACETATE 1 % OP SUSP
1.0000 [drp] | Freq: Four times a day (QID) | OPHTHALMIC | Status: DC
  Administered 2015-02-23 – 2015-02-24 (×4): 1 [drp] via OPHTHALMIC
  Filled 2015-02-23: qty 1

## 2015-02-23 MED ORDER — LIDOCAINE HCL (CARDIAC) 20 MG/ML IV SOLN
INTRAVENOUS | Status: DC | PRN
  Administered 2015-02-23: 60 mg via INTRAVENOUS

## 2015-02-23 MED ORDER — SODIUM CHLORIDE 0.9 % IV SOLN: INTRAVENOUS | Status: DC

## 2015-02-23 MED ORDER — DULOXETINE HCL 30 MG PO CPEP
30.0000 mg | ORAL_CAPSULE | Freq: Every day | ORAL | Status: DC
  Administered 2015-02-23 – 2015-02-24 (×2): 30 mg via ORAL
  Filled 2015-02-23 (×2): qty 1

## 2015-02-23 MED ORDER — CROMOLYN SODIUM 4 % OP SOLN
1.0000 [drp] | Freq: Four times a day (QID) | OPHTHALMIC | Status: DC
  Administered 2015-02-23 – 2015-02-24 (×4): 1 [drp] via OPHTHALMIC
  Filled 2015-02-23: qty 10

## 2015-02-23 NOTE — Anesthesia Preprocedure Evaluation (Signed)
Anesthesia Evaluation  Patient identified by MRN, date of birth, ID band Patient awake    Reviewed: Allergy & Precautions, NPO status , Patient's Chart, lab work & pertinent test results  Airway Mallampati: II  TM Distance: >3 FB     Dental  (+) Edentulous Upper, Partial Lower   Pulmonary former smoker,     + decreased breath sounds      Cardiovascular hypertension,  Rhythm:Regular Rate:Normal     Neuro/Psych    GI/Hepatic   Endo/Other  diabetes  Renal/GU      Musculoskeletal   Abdominal   Peds  Hematology   Anesthesia Other Findings   Reproductive/Obstetrics                             Anesthesia Physical Anesthesia Plan  ASA: III  Anesthesia Plan: MAC   Post-op Pain Management:    Induction: Intravenous  Airway Management Planned: Natural Airway and Simple Face Mask  Additional Equipment:   Intra-op Plan:   Post-operative Plan:   Informed Consent:   Plan Discussed with: CRNA and Anesthesiologist  Anesthesia Plan Comments: (Abd Pain weight loss, diarrhea Type 2 DM glucose 82 Hypertension H/O CAD S/P PTCA LCx 2008 and LAD 2009, no angina H/O asthma, no wheezing on exam Poor historian ? Aphasia  Plan MAC)        Anesthesia Quick Evaluation

## 2015-02-23 NOTE — Op Note (Signed)
[facility] [place], [id number]   ENDOSCOPY PROCEDURE REPORT  PATIENT: Dominique, [name]  MR#: [id number] BIRTHDATE: 25-Jan-1951 , 70  yrs. old GENDER: female ENDOSCOPIST: Gatha Mayer, MD, [facility] PROCEDURE DATE:  02/23/2015 PROCEDURE:  EGD, diagnostic ASA CLASS:     Class III INDICATIONS:  diagnostic procedure and epigastric pain. MEDICATIONS: Monitored anesthesia care and Per Anesthesia TOPICAL ANESTHETIC: Cetacaine Spray  DESCRIPTION OF PROCEDURE: After the risks benefits and alternatives of the procedure were thoroughly explained, informed consent was obtained.  The    endoscope was introduced through the mouth and advanced to the second portion of the duodenum , Without limitations.  The instrument was slowly withdrawn as the mucosa was fully examined.    A few 2-3 mm flat white polyps in proximal stomach.  Consistent with fundic gland polyps.  Otherwise normal. No cause of pain seen.  Retroflexed views revealed as previously described.     The scope was then withdrawn from the patient and the procedure completed.  COMPLICATIONS: There were no immediate complications.  ENDOSCOPIC IMPRESSION: A few 2-3 mm flat white polyps in proximal stomach.  Consistent with fundic gland polyps.  Otherwise normal. No cause of pain seen  RECOMMENDATIONS: She has a chronic pain syndrome involving abdomen (hyperesthetic exam) and treatment for chronic pain makes sense.  it has not and will not respond to GI treatment.  She does also have IBS it seems.  I would consider using duolxetine to help - could be started by PCP.  She does not need GI f/u for her pain as it is not really a "GI problem" but part of a chronic pain syndrome I think  When she does need GI follow-up she will be assigned to one of our new MD's - Drs Havery Moros, Silverio Decamp or Danis.   eSigned:  Gatha Mayer, MD, [facility] 02/23/2015 12:16 PM

## 2015-02-23 NOTE — Evaluation (Signed)
Physical Therapy Evaluation Patient Details Name: [name] MRN: [id number] DOB: [birth year] Today's Date: 02/23/2015   History of Present Illness  65 year old female with a history of hyperlipidemia, diabetes mellitus type 2, hypertension, coronary artery disease, asthma presented with diarrhea and abdominal pain. Unfortunately, the patient is not a good historian.  Clinical Impression  Pt admitted with above diagnosis. Pt currently with functional limitations due to the deficits listed below (see PT Problem List).  Pt will benefit from skilled PT to increase their independence and safety with mobility to allow discharge to the venue listed below.       Follow Up Recommendations Home health PT;Supervision/Assistance - 24 hour    Equipment Recommendations  Rolling walker with 5" wheels (if pt does not already have)    Recommendations for Other Services       Precautions / Restrictions Precautions Precautions: Fall Restrictions Weight Bearing Restrictions: No      Mobility  Bed Mobility Overal bed mobility: Needs Assistance Bed Mobility: Supine to Sit     Supine to sit: Supervision     General bed mobility comments: HOB elevated and use of bed rail  Transfers Overall transfer level: Needs assistance Equipment used: Rolling walker (2 wheeled) Transfers: Sit to/from Stand Sit to Stand: Min assist         General transfer comment: LOB to L upon standing  Ambulation/Gait Ambulation/Gait assistance: Min assist Ambulation Distance (Feet): 30 Feet Assistive device: Rolling walker (2 wheeled) Gait Pattern/deviations: Step-to pattern;Decreased stride length;Narrow base of support;Scissoring;Staggering left (decreased bil foot clearance)   Gait velocity interpretation: Below normal speed for age/gender General Gait Details: LOB to L with amb x 2-3 times with min A to correct  Stairs            Wheelchair Mobility    Modified Rankin (Stroke Patients Only)       Balance Overall balance assessment: Needs assistance;History of Falls Sitting-balance support: No upper extremity supported;Feet supported Sitting balance-Leahy Scale: Fair     Standing balance support: Bilateral upper extremity supported;During functional activity Standing balance-Leahy Scale: Poor                               Pertinent Vitals/Pain Pain Assessment: Faces Faces Pain Scale: Hurts little more Pain Location: abdomen Pain Intervention(s): Monitored during session;Repositioned    Home Living Family/patient expects to be discharged to:: Private residence Living Arrangements: Children Available Help at Discharge: Available 24 hours/day;Family Type of Home: House Home Access: Stairs to enter Entrance Stairs-Rails: Right Entrance Stairs-Number of Steps: 4 Home Layout: One level Home Equipment: Cane - single point      Prior Function Level of Independence: Independent with assistive device(s)         Comments: used SPC     Hand Dominance        Extremity/Trunk Assessment   Upper Extremity Assessment: Generalized weakness           Lower Extremity Assessment: Generalized weakness         Communication   Communication: Expressive difficulties (stutter)  Cognition Arousal/Alertness: Lethargic;Suspect due to medications Behavior During Therapy: Flat affect Overall Cognitive Status: No family/caregiver present to determine baseline cognitive functioning                      General Comments General comments (skin integrity, edema, etc.): pt lethargic throughout session; likely due to anesthesia and recent procedure  Exercises        Assessment/Plan    PT Assessment Patient needs continued PT services  PT Diagnosis Abnormality of gait;Generalized weakness;Difficulty walking;Acute pain   PT Problem List Decreased strength;Decreased activity tolerance;Decreased balance;Decreased mobility;Decreased  coordination;Decreased knowledge of use of DME;Decreased knowledge of precautions;Pain  PT Treatment Interventions DME instruction;Gait training;Stair training;Functional mobility training;Therapeutic activities;Therapeutic exercise;Balance training;Neuromuscular re-education;Cognitive remediation;Patient/family education   PT Goals (Current goals can be found in the Care Plan section) Acute Rehab PT Goals Patient Stated Goal: to go home PT Goal Formulation: With patient Time For Goal Achievement: 03/09/15 Potential to Achieve Goals: Good    Frequency Min 3X/week   Barriers to discharge        Co-evaluation               End of Session Equipment Utilized During Treatment: Gait belt Activity Tolerance: Patient tolerated treatment well;Patient limited by lethargy Patient left: in chair;with call bell/phone within reach;with chair alarm set;with nursing/sitter in room Nurse Communication: Mobility status         Time: 1336-1400 PT Time Calculation (min) (ACUTE ONLY): 24 min   Charges:   PT Evaluation $PT Eval Moderate Complexity: 1 Procedure PT Treatments $Gait Training: 8-22 mins   PT G Codes:           Laureen Abrahams, PT, DPT 02/23/2015 2:15 PM [id number]

## 2015-02-23 NOTE — Progress Notes (Signed)
Branch to discuss home eye drops with patient. She is a very poor historian and cannot tell me any name or even how many eye drops she uses at home. When asked about a list, she handed me what we already have in Epic and doubt her eye drops are correct. Called Dr. Vickey Sages office to clarify eye drops she is supposed to be taking.  Prednisolone 1% 1 drop both eyes qid Cromolyn 4% 1 drop both eyes qid Homatropine 5% 1 drop left eye daily  Spoke with Dr. Sloan Leiter and ok to update orders.  [place]ary, Pharm.D., BCPS Clinical Pharmacist Pager: [phone] 02/23/2015 8:45 AM

## 2015-02-23 NOTE — Anesthesia Postprocedure Evaluation (Signed)
Anesthesia Post Note  Patient: MEESHA BROMAN  Procedure(s) Performed: Procedure(s) (LRB): ESOPHAGOGASTRODUODENOSCOPY (EGD) (N/A)  Patient location during evaluation: Endoscopy Anesthesia Type: MAC Level of consciousness: awake and alert and patient cooperative Pain management: pain level controlled Vital Signs Assessment: post-procedure vital signs reviewed and stable Respiratory status: spontaneous breathing Cardiovascular status: blood pressure returned to baseline Postop Assessment: no headache Anesthetic complications: no    Last Vitals:  Filed Vitals:   02/23/15 1035 02/23/15 1216  BP: 172/64 122/60  Pulse: 57 65  Temp: 37.1 C 36.4 C  Resp: 18 15    Last Pain:  Filed Vitals:   02/23/15 1217  PainSc: Asleep                 Mekhai Venuto

## 2015-02-23 NOTE — Transfer of Care (Signed)
Immediate Anesthesia Transfer of Care Note  Patient: [name]  Procedure(s) Performed: Procedure(s): ESOPHAGOGASTRODUODENOSCOPY (EGD) (N/A)  Patient Location: Endoscopy Unit  Anesthesia Type:MAC  Level of Consciousness: awake, alert  and patient cooperative  Airway & Oxygen Therapy: Patient Spontanous Breathing and Patient connected to nasal cannula oxygen  Post-op Assessment: Report given to RN, Post -op Vital signs reviewed and stable and Patient moving all extremities  Post vital signs: Reviewed and stable  Last Vitals:  Filed Vitals:   02/23/15 1035 02/23/15 1216  BP: 172/64 122/60  Pulse: 57 65  Temp: 37.1 C 36.4 C  Resp: 18 15    Complications: No apparent anesthesia complications

## 2015-02-23 NOTE — Progress Notes (Signed)
[facility] PROGRESS NOTE  ADAMINA LAVELLE [id number] DOB: Jan 20, 1951 DOA: 02/18/2015 PCP: Elyn Peers, MD  Subjective: Continues to complain of epigastric pain. No diarrhea  Assessment/Plan: Active Problems : Diarrhea: Seems to have resolved. Ongoing for the past 2-3 months. Colonoscopy/EGD/Capsule Endo 2013 negative for significant abnormalities. C. difficile PCR and GI pathogen panel negative. Suspect she has IBS.GI following-No further recommendations as diarrhea has completely resolved. She could've had transient gastroenteritis.  Epigastric Pain:Continues to be tender in upper abd area-but soft.CT Abd and EGD negative.Per GI-could be Neuropathic or secondary to IBS. Will start Cymbalta. Continue PPI/Carafate. Doubt cardiac cause-EKG/Trop neg.   Acute on chronic kidney disease stage III: ARF secondary to prerenal azotemia in the setting of diarrhea/dehydration. Improved with IV fluids, creatinine continues to decrease.   Hypernatremia: resolved, stop IVF. Encourage oral intake.   Hyperchloremia: follow-likely from diarrhea  Hypertension: uncontrolled-restart Amlodipine, continue metoprolol-follow  History of CAD: Currently without chest pain or shortness of breath. Continue aspirin, statin and metoprolol.  Type 2 Diabetes: CBGs currently stable with 20 units of Lantus and SSI. Resume metformin on discharge. Follow and adjust accordingly  Dyslipidemia: Continue statin  Prior history of cocaine abuse: UDS 1/14 negative.  Anxiety: Appears anxious, but denies any depression-like symptoms to me. Continue Xanax as needed.  Bronchial asthma: Lungs clear, this appears stable. Continue bronchodilators.  GERD: Continue PPI/Carafate  Deconditioning:PT eval  Code Status: Full code Family Communication: None at bedside Disposition Plan: Remain inpatient-Home 1/19-await PT eval   Consultants:  GI  Procedures:  None  Antibiotics:  None    Objective: Filed  Vitals:   02/23/15 1240 02/23/15 1250  BP: 182/57 169/58  Pulse: 57 56  Temp:    Resp: 13 11    Intake/Output Summary (Last 24 hours) at 02/23/15 1318 Last data filed at 02/23/15 1220  Gross per 24 hour  Intake    460 ml  Output    300 ml  Net    160 ml   Filed Weights   02/22/15 1930  Weight: 56.609 kg (124 lb 12.8 oz)    Exam:   General: Awake and alert, stuttering speech (baseline per patient)  HEENT: Supple, no JVD  Cardiovascular: Regular rate and rhythm, no murmurs  Respiratory: Clear to auscultation bilaterally  Abdomen: Soft, epigastric area mildly tender to palpation  Musculoskeletal: Non focal, no edema  Data Reviewed: Basic Metabolic Panel:  Recent Labs Lab 02/18/15 1810 02/19/15 0934 02/20/15 0337 02/21/15 0550  NA 142 141 146* 143  K 4.4 4.3 4.4 4.2  CL 109 110 115* 114*  CO2 21* 23 25 21*  GLUCOSE 91 114* 105* 89  BUN 23* 17 13 6   CREATININE 1.67* 1.39* 1.13* 1.04*  CALCIUM 9.7 9.1 9.0 9.2   Liver Function Tests:  Recent Labs Lab 02/18/15 1810  AST 16  ALT 11*  ALKPHOS 81  BILITOT 0.8  PROT 8.1  ALBUMIN 3.7    Recent Labs Lab 02/19/15 0934  LIPASE 32   No results for input(s): AMMONIA in the last 168 hours. CBC:  Recent Labs Lab 02/18/15 1810 02/19/15 0934 02/20/15 0337  WBC 7.3 4.7 4.5  HGB 13.7 12.5 11.8*  HCT 42.3 39.3 37.5  MCV 89.6 91.8 91.7  PLT 332 284 250   Cardiac Enzymes:  Recent Labs Lab 02/21/15 1430  TROPONINI <0.03   BNP (last 3 results)  Recent Labs  06/06/14 1450  BNP 19.2    ProBNP (last 3 results) No results for input(s): PROBNP in  the last 8760 hours.  CBG:  Recent Labs Lab 02/22/15 1107 02/22/15 1616 02/22/15 2230 02/23/15 0745 02/23/15 1033  GLUCAP 130* 151* 101* 82 78    Recent Results (from the past 240 hour(s))  Culture, Urine     Status: None   Collection Time: 02/18/15  6:06 PM  Result Value Ref Range Status   Specimen Description URINE, RANDOM  Final    Special Requests NONE  Final   Culture MULTIPLE SPECIES PRESENT, SUGGEST RECOLLECTION  Final   Report Status 02/20/2015 FINAL  Final  C difficile quick scan w PCR reflex     Status: None   Collection Time: 02/19/15  6:56 AM  Result Value Ref Range Status   C Diff antigen NEGATIVE NEGATIVE Final   C Diff toxin NEGATIVE NEGATIVE Final   C Diff interpretation Negative for toxigenic C. difficile  Final  Gastrointestinal Panel by PCR , Stool     Status: None   Collection Time: 02/19/15  6:56 AM  Result Value Ref Range Status   Campylobacter species NOT DETECTED NOT DETECTED Final   Plesimonas shigelloides NOT DETECTED NOT DETECTED Final   Salmonella species NOT DETECTED NOT DETECTED Final   Yersinia enterocolitica NOT DETECTED NOT DETECTED Final   Vibrio species NOT DETECTED NOT DETECTED Final   Vibrio cholerae NOT DETECTED NOT DETECTED Final   Enteroaggregative E coli (EAEC) NOT DETECTED NOT DETECTED Final   Enteropathogenic E coli (EPEC) NOT DETECTED NOT DETECTED Final   Enterotoxigenic E coli (ETEC) NOT DETECTED NOT DETECTED Final   Shiga like toxin producing E coli (STEC) NOT DETECTED NOT DETECTED Final   E. coli O157 NOT DETECTED NOT DETECTED Final   Shigella/Enteroinvasive E coli (EIEC) NOT DETECTED NOT DETECTED Final   Cryptosporidium NOT DETECTED NOT DETECTED Final   Cyclospora cayetanensis NOT DETECTED NOT DETECTED Final   Entamoeba histolytica NOT DETECTED NOT DETECTED Final   Giardia lamblia NOT DETECTED NOT DETECTED Final   Adenovirus F40/41 NOT DETECTED NOT DETECTED Final   Astrovirus NOT DETECTED NOT DETECTED Final   Norovirus GI/GII NOT DETECTED NOT DETECTED Final   Rotavirus A NOT DETECTED NOT DETECTED Final   Sapovirus (I, II, IV, and V) NOT DETECTED NOT DETECTED Final     Studies: Dg Abd 2 Views  02/21/2015  CLINICAL DATA:  Abdominal pain for a few days EXAM: ABDOMEN - 2 VIEW COMPARISON:  CT 02/19/2015 FINDINGS: Moderate stool burden throughout the colon. No evidence  of bowel obstruction or free air. No organomegaly. Oral contrast material noted within the left side of the colon. No acute bony abnormality. IMPRESSION: Moderate stool burden.  No obstruction or free air. Electronically Signed   By: Rolm Baptise M.D.   On: 02/21/2015 13:43    Scheduled Meds: . [MAR Hold] albuterol  2.5 mg Nebulization BID  . [MAR Hold] ALPRAZolam  0.25 mg Oral BID  . [MAR Hold] amLODipine  5 mg Oral Daily  . [MAR Hold] aspirin EC  81 mg Oral Daily  . [MAR Hold] atorvastatin  20 mg Oral Daily  . [MAR Hold] budesonide-formoterol  2 puff Inhalation BID  . [MAR Hold] cromolyn  1 drop Both Eyes QID  . [MAR Hold] enoxaparin (LOVENOX) injection  40 mg Subcutaneous Q24H  . [MAR Hold] feeding supplement (ENSURE ENLIVE)  237 mL Oral BID BM  . [MAR Hold] homatropine  1 drop Left Eye Daily  . [MAR Hold] insulin aspart  0-5 Units Subcutaneous QHS  . [MAR Hold] insulin aspart  0-9 Units Subcutaneous TID WC  . [MAR Hold] insulin glargine  20 Units Subcutaneous Q2200  . [MAR Hold] metoprolol succinate  25 mg Oral QPM  . [MAR Hold] pantoprazole  40 mg Oral BID AC  . [MAR Hold] prednisoLONE acetate  1 drop Both Eyes QID  . [MAR Hold] pregabalin  150 mg Oral BID  . [MAR Hold] sucralfate  1 g Oral TID WC & HS   Continuous Infusions:   Active Problems:   Essential hypertension   Dehydration   Acute on chronic renal failure (HCC)   Abdominal pain, epigastric  Time spent: 35 minutes  [facility]   If 7PM-7AM, please contact night-coverage at [email or web address], password [facility] 02/23/2015, 1:18 PM  LOS: 4 days

## 2015-02-23 NOTE — Anesthesia Postprocedure Evaluation (Signed)
Anesthesia Post Note  Patient: [name]  Procedure(s) Performed: Procedure(s) (LRB): ESOPHAGOGASTRODUODENOSCOPY (EGD) (N/A)  Patient location during evaluation: Endoscopy Anesthesia Type: General Level of consciousness: awake and awake and alert Pain management: pain level controlled Vital Signs Assessment: post-procedure vital signs reviewed and stable Respiratory status: spontaneous breathing Anesthetic complications: no    Last Vitals:  Filed Vitals:   02/23/15 1250 02/23/15 1350  BP: 169/58 145/64  Pulse: 56 65  Temp:  37.2 C  Resp: 11 20    Last Pain:  Filed Vitals:   02/23/15 1350  PainSc: Asleep                 Austin Pongratz COKER

## 2015-02-24 ENCOUNTER — Encounter (HOSPITAL_COMMUNITY): Payer: Self-pay | Admitting: Internal Medicine

## 2015-02-24 LAB — GLUCOSE, CAPILLARY
GLUCOSE-CAPILLARY: 184 mg/dL — AB (ref 65–99)
Glucose-Capillary: 88 mg/dL (ref 65–99)

## 2015-02-24 MED ORDER — SUCRALFATE 1 GM/10ML PO SUSP: 1.0000 g | Freq: Three times a day (TID) | ORAL | Status: DC

## 2015-02-24 MED ORDER — DULOXETINE HCL 30 MG PO CPEP: 30.0000 mg | ORAL_CAPSULE | Freq: Every day | ORAL | Status: DC

## 2015-02-24 MED ORDER — PANTOPRAZOLE SODIUM 40 MG PO TBEC: 40.0000 mg | DELAYED_RELEASE_TABLET | Freq: Every day | ORAL | Status: DC

## 2015-02-24 MED ORDER — ENSURE ENLIVE PO LIQD: 237.0000 mL | Freq: Two times a day (BID) | ORAL | Status: DC

## 2015-02-24 MED ORDER — INSULIN GLARGINE 100 UNIT/ML SOLOSTAR PEN: 20.0000 [IU] | PEN_INJECTOR | Freq: Every day | SUBCUTANEOUS | Status: DC

## 2015-02-24 MED ORDER — HYDROCODONE-ACETAMINOPHEN 5-325 MG PO TABS: 1.0000 | ORAL_TABLET | Freq: Four times a day (QID) | ORAL | Status: DC | PRN

## 2015-02-24 NOTE — Discharge Summary (Addendum)
PATIENT DETAILS Name: [name] Age: 65 y.o. Sex: female Date of Birth: 10-06-1950 MRN: [id number]. Admitting Physician: Orson Eva, MD [id number] J, MD  Admit Date: 02/18/2015 Discharge date: 02/24/2015  Recommendations for Outpatient Follow-up:  1. Please refer to pain management MD 2. Please repeat CBC/BMET at next visit  PRIMARY DISCHARGE DIAGNOSIS:  Active Problems:   Essential hypertension   Dehydration   Acute on chronic renal failure (HCC)   Abdominal pain, epigastric      PAST MEDICAL HISTORY: Past Medical History  Diagnosis Date  . History of colonic polyps 2009    adenomatous 2009, HP in 2009, 2011, 2013.   Marland Kitchen GERD (gastroesophageal reflux disease)   . Hyperlipidemia   . Diabetes mellitus without mention of complication   . Hypertension   . Myocardial infarction ([place])   . Coronary artery disease   . Fatty liver disease, nonalcoholic [id number]    on Imaging.   . Cholelithiasis   . Iron deficiency anemia   . Angina   . Asthma   . Arthritis     HANDS"  . Cocaine abuse 07/2012    per E.R. drug screen  . Dysphagia     esophageal dysmotility on 03/2011 esophagram     DISCHARGE MEDICATIONS: Current Discharge Medication List    START taking these medications   Details  DULoxetine (CYMBALTA) 30 MG capsule Take 1 capsule (30 mg total) by mouth daily. Qty: 30 capsule, Refills: 0    feeding supplement, ENSURE ENLIVE, (ENSURE ENLIVE) LIQD Take 237 mLs by mouth 2 (two) times daily between meals. Qty: 60 Bottle, Refills: 0    HYDROcodone-acetaminophen (NORCO/VICODIN) 5-325 MG tablet Take 1-2 tablets by mouth every 6 (six) hours as needed for moderate pain. Qty: 30 tablet, Refills: 0      CONTINUE these medications which have CHANGED   Details  Insulin Glargine (LANTUS SOLOSTAR) 100 UNIT/ML Solostar Pen Inject 20 Units into the skin daily at 10 pm. Qty: 15 mL, Refills: 0    pantoprazole (PROTONIX) 40 MG tablet Take 1 tablet (40 mg total) by mouth  daily. Qty: 60 tablet, Refills: 0    sucralfate (CARAFATE) 1 GM/10ML suspension Take 10 mLs (1 g total) by mouth 4 (four) times daily -  with meals and at bedtime. Qty: 420 mL, Refills: 0      CONTINUE these medications which have NOT CHANGED   Details  acetaminophen (TYLENOL) 500 MG tablet Take 500 mg by mouth every 6 (six) hours as needed for mild pain.    albuterol (PROVENTIL) (2.5 MG/3ML) 0.083% nebulizer solution Take 2.5 mg by nebulization 2 (two) times daily.    ALPRAZolam (XANAX) 0.25 MG tablet Take 0.25 mg by mouth 2 (two) times daily.     amLODipine (NORVASC) 5 MG tablet Take 5 mg by mouth daily.    aspirin EC 81 MG EC tablet Take 1 tablet (81 mg total) by mouth daily. Qty: 30 tablet, Refills: 0    atorvastatin (LIPITOR) 20 MG tablet Take 20 mg by mouth daily.    budesonide-formoterol (SYMBICORT) 160-4.5 MCG/ACT inhaler Inhale 2 puffs into the lungs 2 (two) times daily.    cromolyn (OPTICROM) 4 % ophthalmic solution Place 1 drop into both eyes 4 (four) times daily.    homatropine 5 % ophthalmic solution Place 1 drop into the left eye daily.    Iron-FA-B Cmp-C-Biot-Probiotic (FUSION PLUS PO) Take 1 capsule by mouth daily.    metFORMIN (GLUCOPHAGE) 1000 MG tablet Take 1,000 mg by mouth  2 (two) times daily with a meal.    metoprolol succinate (TOPROL-XL) 50 MG 24 hr tablet Take 50 mg by mouth every evening. Take with or immediately following a meal.    nitroGLYCERIN (NITROSTAT) 0.4 MG SL tablet Place 1 tablet (0.4 mg total) under the tongue every 5 (five) minutes as needed. For chest pain Qty: 10 tablet, Refills: 0    prednisoLONE acetate (PRED FORTE) 1 % ophthalmic suspension Place 1 drop into both eyes 4 (four) times daily.     pregabalin (LYRICA) 150 MG capsule Take 150 mg by mouth 2 (two) times daily.     diphenoxylate-atropine (LOMOTIL) 2.5-0.025 MG per tablet Take 1-2 tablets by mouth See admin instructions. Take 2 tablets by mouth on onset of diarrhea, repeat in  one hour; the take 1 tablet three times daily until diarrhea stops      STOP taking these medications     HYDROcodone-acetaminophen (NORCO) 10-325 MG per tablet         ALLERGIES:  No Known Allergies  BRIEF HPI:  See H&P, Labs, Consult and Test reports for all details in brief, patient was admitted for evaluation of diarrhea and epigastric pain.  CONSULTATIONS:   GI  PERTINENT RADIOLOGIC STUDIES: Ct Abdomen Pelvis Wo Contrast  02/19/2015  CLINICAL DATA:  Acute onset of nausea, vomiting and diarrhea. Mid abdominal pain, radiating from the xiphoid process down to the pelvis. Initial encounter. EXAM: CT ABDOMEN AND PELVIS WITHOUT CONTRAST TECHNIQUE: Multidetector CT imaging of the abdomen and pelvis was performed following the standard protocol without IV contrast. COMPARISON:  CT of the abdomen and pelvis performed 03/31/2014 FINDINGS: Minimal bibasilar atelectasis is noted. Diffuse coronary artery calcifications are seen. The liver and spleen are unremarkable in appearance. A stone is noted within the gallbladder. The gallbladder is otherwise unremarkable. The pancreas and adrenal glands are unremarkable. Apparent mild right-sided hydronephrosis is noted, with mild right renal atrophy, but no evidence of distal obstructing stone. This may reflect an underlying stricture, or possibly a recently passed stone. The left kidney is unremarkable in appearance. No renal or ureteral stones are identified. No significant perinephric stranding is seen. No free fluid is identified. The small bowel is unremarkable in appearance. Clumping of bowel at the upper pelvis is thought to be transient in nature. The stomach is within normal limits. No acute vascular abnormalities are seen. Scattered calcification is noted along the abdominal aorta and its branches. The appendix is not definitely characterized; there is no evidence of appendicitis. The colon is unremarkable in appearance. The bladder is moderately  distended and grossly unremarkable. The uterus is unremarkable in appearance. The ovaries are relatively symmetric. No suspicious adnexal masses are seen. No inguinal lymphadenopathy is seen. No acute osseous abnormalities are identified. Facet disease is noted along the lumbar spine. IMPRESSION: 1. Apparent mild right-sided hydronephrosis, with mild right renal atrophy, but no definite distal obstructing stone. This may reflect an underlying stricture, or possibly a recently passed stone. 2. Scattered calcification along the abdominal aorta and its branches. 3. Cholelithiasis.  Gallbladder otherwise unremarkable. 4. Diffuse coronary artery calcifications seen. Electronically Signed   By: Garald Balding M.D.   On: 02/19/2015 02:38   Dg Abd 2 Views  02/21/2015  CLINICAL DATA:  Abdominal pain for a few days EXAM: ABDOMEN - 2 VIEW COMPARISON:  CT 02/19/2015 FINDINGS: Moderate stool burden throughout the colon. No evidence of bowel obstruction or free air. No organomegaly. Oral contrast material noted within the left side of the colon.  No acute bony abnormality. IMPRESSION: Moderate stool burden.  No obstruction or free air. Electronically Signed   By: Rolm Baptise M.D.   On: 02/21/2015 13:43     PERTINENT LAB RESULTS: CBC: No results for input(s): WBC, HGB, HCT, PLT in the last 72 hours. CMET CMP     Component Value Date/Time   NA 143 02/21/2015 0550   K 4.2 02/21/2015 0550   CL 114* 02/21/2015 0550   CO2 21* 02/21/2015 0550   GLUCOSE 89 02/21/2015 0550   BUN 6 02/21/2015 0550   CREATININE 1.04* 02/21/2015 0550   CALCIUM 9.2 02/21/2015 0550   PROT 8.1 02/18/2015 1810   ALBUMIN 3.7 02/18/2015 1810   AST 16 02/18/2015 1810   ALT 11* 02/18/2015 1810   ALKPHOS 81 02/18/2015 1810   BILITOT 0.8 02/18/2015 1810   GFRNONAA 55* 02/21/2015 0550   GFRAA >60 02/21/2015 0550    GFR Estimated Creatinine Clearance: 40.8 mL/min (by C-G formula based on Cr of 1.04). No results for input(s): LIPASE,  AMYLASE in the last 72 hours.  Recent Labs  02/21/15 1430  TROPONINI <0.03   Invalid input(s): POCBNP No results for input(s): DDIMER in the last 72 hours. No results for input(s): HGBA1C in the last 72 hours. No results for input(s): CHOL, HDL, LDLCALC, TRIG, CHOLHDL, LDLDIRECT in the last 72 hours. No results for input(s): TSH, T4TOTAL, T3FREE, THYROIDAB in the last 72 hours.  Invalid input(s): FREET3 No results for input(s): VITAMINB12, FOLATE, FERRITIN, TIBC, IRON, RETICCTPCT in the last 72 hours. Coags: No results for input(s): INR in the last 72 hours.  Invalid input(s): PT Microbiology: Recent Results (from the past 240 hour(s))  Culture, Urine     Status: None   Collection Time: 02/18/15  6:06 PM  Result Value Ref Range Status   Specimen Description URINE, RANDOM  Final   Special Requests NONE  Final   Culture MULTIPLE SPECIES PRESENT, SUGGEST RECOLLECTION  Final   Report Status 02/20/2015 FINAL  Final  C difficile quick scan w PCR reflex     Status: None   Collection Time: 02/19/15  6:56 AM  Result Value Ref Range Status   C Diff antigen NEGATIVE NEGATIVE Final   C Diff toxin NEGATIVE NEGATIVE Final   C Diff interpretation Negative for toxigenic C. difficile  Final  Gastrointestinal Panel by PCR , Stool     Status: None   Collection Time: 02/19/15  6:56 AM  Result Value Ref Range Status   Campylobacter species NOT DETECTED NOT DETECTED Final   Plesimonas shigelloides NOT DETECTED NOT DETECTED Final   Salmonella species NOT DETECTED NOT DETECTED Final   Yersinia enterocolitica NOT DETECTED NOT DETECTED Final   Vibrio species NOT DETECTED NOT DETECTED Final   Vibrio cholerae NOT DETECTED NOT DETECTED Final   Enteroaggregative E coli (EAEC) NOT DETECTED NOT DETECTED Final   Enteropathogenic E coli (EPEC) NOT DETECTED NOT DETECTED Final   Enterotoxigenic E coli (ETEC) NOT DETECTED NOT DETECTED Final   Shiga like toxin producing E coli (STEC) NOT DETECTED NOT  DETECTED Final   E. coli O157 NOT DETECTED NOT DETECTED Final   Shigella/Enteroinvasive E coli (EIEC) NOT DETECTED NOT DETECTED Final   Cryptosporidium NOT DETECTED NOT DETECTED Final   Cyclospora cayetanensis NOT DETECTED NOT DETECTED Final   Entamoeba histolytica NOT DETECTED NOT DETECTED Final   Giardia lamblia NOT DETECTED NOT DETECTED Final   Adenovirus F40/41 NOT DETECTED NOT DETECTED Final   Astrovirus NOT DETECTED NOT DETECTED  Final   Norovirus GI/GII NOT DETECTED NOT DETECTED Final   Rotavirus A NOT DETECTED NOT DETECTED Final   Sapovirus (I, II, IV, and V) NOT DETECTED NOT DETECTED Final     BRIEF HOSPITAL COURSE:  Diarrhea: Resolved with just supportive measures. Ongoing for the past 2-3 months. Colonoscopy/EGD/Capsule Endo 2013 negative for significant abnormalities. C. difficile PCR and GI pathogen panel negative. Suspect she has IBS.GI following-No further recommendations as diarrhea has completely resolved. She could've had transient gastroenteritis.  Epigastric Pain:Continues to be tender in upper abd area-but soft on exam.Slowly improved during this hospital stay-on day of discharge-pain not as bad as the past few days per patient. CT Abd and EGD negative.Per GI-could be Neuropathic or secondary to IBS. Will start Cymbalta. Continue PPI/Carafate on discharge. Doubt cardiac cause-EKG/Trop neg. Suggest outpatient referral to pain management MF  Acute on chronic kidney disease stage III: ARF secondary to prerenal azotemia in the setting of diarrhea/dehydration. Resolved with IV fluids.Follow lytes closely in the outpatient setting.  Hypernatremia: resolved, stop IVF. Encouraged oral intake.   Hyperchloremia: follow-likely from diarrhea  Hypertension: controlled-continue Amlodipine and metoprolol-follow closely in the outpatient setting and optimize accordingly  History of CAD: Currently without chest pain or shortness of breath. Continue aspirin, statin and  metoprolol.  Type 2 Diabetes: CBGs currently stable with 20 units of Lantus, continue on discharge.  Resume metformin on discharge. Follow and adjust accordingly  Dyslipidemia: Continue statin  Prior history of cocaine abuse: UDS 1/14 negative.  Anxiety: Appears anxious, but denies any depression-like symptoms to me. Continue Xanax as needed.  Bronchial asthma: Lungs clear, this appears stable. Continue bronchodilators.  GERD: Continue PPI/Carafate  Deconditioning:PT eval completed-recommendations are for HHPT-ordered.  Speech Impediment: stammers/stutters-chronic issue (note this was reconfirmed multiple times with the patient, and son over the phone)   TODAY-DAY OF DISCHARGE:  Subjective:   [name] today has no headache,no chest abdominal pain,no new weakness tingling or numbness, feels much better wants to go home today.   Objective:   Blood pressure 145/66, pulse 66, temperature 97.8 F (36.6 C), temperature source Oral, resp. rate 18, height 4\' 11"  (1.499 m), weight 56.609 kg (124 lb 12.8 oz), SpO2 100 %.  Intake/Output Summary (Last 24 hours) at 02/24/15 1010 Last data filed at 02/24/15 0542  Gross per 24 hour  Intake    780 ml  Output   1650 ml  Net   -870 ml   Filed Weights   02/22/15 1930  Weight: 56.609 kg (124 lb 12.8 oz)    Exam Awake Alert, Oriented *3, No new F.N deficits, Normal affect [place].AT,PERRAL Supple Neck,No JVD, No cervical lymphadenopathy appriciated.  Symmetrical Chest wall movement, Good air movement bilaterally, CTAB RRR,No Gallops,Rubs or new Murmurs, No Parasternal Heave +ve B.Sounds, Abd Soft, Non tender, No organomegaly appriciated, No rebound -guarding or rigidity. No Cyanosis, Clubbing or edema, No new Rash or bruise  DISCHARGE CONDITION: Stable  DISPOSITION: Home with home health services  DISCHARGE INSTRUCTIONS:    Activity:  As tolerated with Full fall precautions use walker/cane & assistance as needed  Get Medicines  reviewed and adjusted: Please take all your medications with you for your next visit with your Primary MD  Please request your Primary MD to go over all hospital tests and procedure/radiological results at the follow up, please ask your Primary MD to get all Hospital records sent to his/her office.  If you experience worsening of your admission symptoms, develop shortness of breath, life threatening emergency, suicidal  or homicidal thoughts you must seek medical attention immediately by calling 911 or calling your MD immediately  if symptoms less severe.  You must read complete instructions/literature along with all the possible adverse reactions/side effects for all the Medicines you take and that have been prescribed to you. Take any new Medicines after you have completely understood and accpet all the possible adverse reactions/side effects.   Do not drive when taking Pain medications.   Do not take more than prescribed Pain, Sleep and Anxiety Medications  Special Instructions: If you have smoked or chewed Tobacco  in the last 2 yrs please stop smoking, stop any regular Alcohol  and or any Recreational drug use.  Wear Seat belts while driving.  Please note  You were cared for by a hospitalist during your hospital stay. Once you are discharged, your primary care physician will handle any further medical issues. Please note that NO REFILLS for any discharge medications will be authorized once you are discharged, as it is imperative that you return to your primary care physician (or establish a relationship with a primary care physician if you do not have one) for your aftercare needs so that they can reassess your need for medications and monitor your lab values.   Diet recommendation: Diabetic Diet Heart Healthy diet  Discharge Instructions    Call MD for:  persistant nausea and vomiting    Complete by:  As directed      Call MD for:  severe uncontrolled pain    Complete by:  As  directed      Diet - low sodium heart healthy    Complete by:  As directed      Diet Carb Modified    Complete by:  As directed      Increase activity slowly    Complete by:  As directed            Follow-up Information    Follow up with Elyn Peers, MD. Schedule an appointment as soon as possible for a visit in 1 week.   Specialty:  Family Medicine   Contact information:   [address] [place] [address] [phone]       Follow up with [facility]. Schedule an appointment as soon as possible for a visit in 1 month.   Specialty:  Gastroenterology   Why:  As needed, If symptoms worsen   Contact information:   [address] [place] [phone]     Total Time spent on discharge equals  45 minutes.  SignedOren Binet 02/24/2015 10:10 AM

## 2015-02-24 NOTE — Progress Notes (Signed)
Physical Therapy Treatment Patient Details Name: [name] MRN: [id number] DOB: 13-Sep-1950 Today's Date: 2015-03-13    History of Present Illness 65 year old female with a history of hyperlipidemia, diabetes mellitus type 2, hypertension, coronary artery disease, asthma presented with diarrhea and abdominal pain. Unfortunately, the patient is not a good historian.    PT Comments    Pt making good progress and is returning home with family.  Follow Up Recommendations  Home health PT;Supervision/Assistance - 24 hour     Equipment Recommendations  Other (comment) (pt received rollator)    Recommendations for Other Services       Precautions / Restrictions Precautions Precautions: Fall Restrictions Weight Bearing Restrictions: No    Mobility  Bed Mobility                  Transfers Overall transfer level: Needs assistance Equipment used: Rolling walker (2 wheeled) Transfers: Sit to/from Stand Sit to Stand: Min guard         General transfer comment: Assist for balance and safety.  Ambulation/Gait Ambulation/Gait assistance: Min guard Ambulation Distance (Feet): 80 Feet Assistive device: 4-wheeled walker Gait Pattern/deviations: Step-through pattern;Decreased step length - right;Decreased step length - left;Narrow base of support Gait velocity: decr Gait velocity interpretation: Below normal speed for age/gender General Gait Details: Pt able to handle manuver rollator well. Unsteady but no LOB   Financial trader Rankin (Stroke Patients Only)       Balance Overall balance assessment: Needs assistance Sitting-balance support: No upper extremity supported;Feet supported Sitting balance-Leahy Scale: Fair     Standing balance support: Bilateral upper extremity supported Standing balance-Leahy Scale: Poor Standing balance comment: walker and supervision for static standing.                     Cognition Arousal/Alertness: Awake/alert Behavior During Therapy: WFL for tasks assessed/performed Overall Cognitive Status: No family/caregiver present to determine baseline cognitive functioning                      Exercises      General Comments        Pertinent Vitals/Pain Pain Assessment: No/denies pain    Home Living                      Prior Function            PT Goals (current goals can now be found in the care plan section) Progress towards PT goals: Progressing toward goals    Frequency  Min 3X/week    PT Plan Current plan remains appropriate    Co-evaluation             End of Session Equipment Utilized During Treatment:  (Used waist band of pants as gait belt) Activity Tolerance: Patient tolerated treatment well Patient left: in chair;with call bell/phone within reach;with chair alarm set     Time: 1500-1515 PT Time Calculation (min) (ACUTE ONLY): 15 min  Charges:  $Gait Training: 8-22 mins                    G Codes:      Leandra Vanderweele 13-Mar-2015, 3:46 PM [facility] PT [phone]

## 2015-02-24 NOTE — Progress Notes (Signed)
Nsg Discharge Note  Admit Date:  02/18/2015 Discharge date: 02/24/2015   [name] to be D/C'd Home per MD order.  AVS completed.  Copy for chart, and copy for patient signed, and dated. Patient/caregiver able to verbalize understanding.  Discharge Medication:   Medication List    STOP taking these medications        HYDROcodone-acetaminophen 10-325 MG tablet  Commonly known as:  NORCO  Replaced by:  HYDROcodone-acetaminophen 5-325 MG tablet      TAKE these medications        acetaminophen 500 MG tablet  Commonly known as:  TYLENOL  Take 500 mg by mouth every 6 (six) hours as needed for mild pain.     albuterol (2.5 MG/3ML) 0.083% nebulizer solution  Commonly known as:  PROVENTIL  Take 2.5 mg by nebulization 2 (two) times daily.     ALPRAZolam 0.25 MG tablet  Commonly known as:  XANAX  Take 0.25 mg by mouth 2 (two) times daily.     amLODipine 5 MG tablet  Commonly known as:  NORVASC  Take 5 mg by mouth daily.     aspirin 81 MG EC tablet  Take 1 tablet (81 mg total) by mouth daily.     atorvastatin 20 MG tablet  Commonly known as:  LIPITOR  Take 20 mg by mouth daily.     budesonide-formoterol 160-4.5 MCG/ACT inhaler  Commonly known as:  SYMBICORT  Inhale 2 puffs into the lungs 2 (two) times daily.     cromolyn 4 % ophthalmic solution  Commonly known as:  OPTICROM  Place 1 drop into both eyes 4 (four) times daily.     diphenoxylate-atropine 2.5-0.025 MG tablet  Commonly known as:  LOMOTIL  Take 1-2 tablets by mouth See admin instructions. Take 2 tablets by mouth on onset of diarrhea, repeat in one hour; the take 1 tablet three times daily until diarrhea stops     DULoxetine 30 MG capsule  Commonly known as:  CYMBALTA  Take 1 capsule (30 mg total) by mouth daily.     feeding supplement (ENSURE ENLIVE) Liqd  Take 237 mLs by mouth 2 (two) times daily between meals.     FUSION PLUS PO  Take 1 capsule by mouth daily.     homatropine 5 % ophthalmic  solution  Place 1 drop into the left eye daily.     HYDROcodone-acetaminophen 5-325 MG tablet  Commonly known as:  NORCO/VICODIN  Take 1-2 tablets by mouth every 6 (six) hours as needed for moderate pain.     Insulin Glargine 100 UNIT/ML Solostar Pen  Commonly known as:  LANTUS SOLOSTAR  Inject 20 Units into the skin daily at 10 pm.     metFORMIN 1000 MG tablet  Commonly known as:  GLUCOPHAGE  Take 1,000 mg by mouth 2 (two) times daily with a meal.     metoprolol succinate 50 MG 24 hr tablet  Commonly known as:  TOPROL-XL  Take 50 mg by mouth every evening. Take with or immediately following a meal.     nitroGLYCERIN 0.4 MG SL tablet  Commonly known as:  NITROSTAT  Place 1 tablet (0.4 mg total) under the tongue every 5 (five) minutes as needed. For chest pain     pantoprazole 40 MG tablet  Commonly known as:  PROTONIX  Take 1 tablet (40 mg total) by mouth daily.     prednisoLONE acetate 1 % ophthalmic suspension  Commonly known as:  PRED FORTE  Place  1 drop into both eyes 4 (four) times daily.     pregabalin 150 MG capsule  Commonly known as:  LYRICA  Take 150 mg by mouth 2 (two) times daily.     sucralfate 1 GM/10ML suspension  Commonly known as:  CARAFATE  Take 10 mLs (1 g total) by mouth 4 (four) times daily -  with meals and at bedtime.        Discharge Assessment: Filed Vitals:   02/23/15 2124 02/24/15 0536  BP: 131/58 145/66  Pulse: 86 66  Temp: 98.3 F (36.8 C) 97.8 F (36.6 C)  Resp: 18 18   Skin clean, dry and intact without evidence of skin break down, no evidence of skin tears noted. IV catheter discontinued intact. Site without signs and symptoms of complications - no redness or edema noted at insertion site, patient denies c/o pain - only slight tenderness at site.  Dressing with slight pressure applied.  D/c Instructions-Education: Discharge instructions given to patient/family with verbalized understanding. D/c education completed with  patient/family including follow up instructions, medication list, d/c activities limitations if indicated, with other d/c instructions as indicated by MD - patient able to verbalize understanding, all questions fully answered. Patient instructed to return to ED, call 911, or call MD for any changes in condition.  Patient escorted via [place], and D/C home via private auto.  Dayle Points, RN 02/24/2015 4:44 PM

## 2015-02-24 NOTE — Care Management Note (Signed)
Case Management Note  Patient Details  Name: [name] MRN: [id number] Date of Birth: 11/20/1950  Subjective/Objective:                  Date- 02-24-15 Initial Assessment Spoke with patient at the bedside along with friend.  Introduced self as Tourist information centre manager and explained role in discharge planning and how to be reached.  Verified patient lives in  [place] with son at home whom she stays stays with her all day and helps her.  Verified patient anticipates to go home with son at time of discharge and will have full time supervision by son at this time to best of their knowledge.  Patient has DME old walker nebulizer. Expressed potential need for no other DME.  Patient denied needing help with their medication.  Patient uses [facility], is driven by son to MD appointments.  Verified patient has PCP. Patient states they currently receive [place] services through no one.      Carles Collet RN BSN CM [phone]   Action/Plan:  Referral made to [facility] for [facility] PT RN HHA, and rolator to be delivered to room prior to discharge. Patient also provided application for CAPS program.  Expected Discharge Date:                  Expected Discharge Plan:  [place]  In-House Referral:     Discharge planning Services  CM Consult  Post Acute Care Choice:  Durable Medical Equipment, Home Health Choice offered to:  Patient  DME Arranged:  Walker rolling DME Agency:  [place] Arranged:  RN, PT, Nurse's Aide [place] Agency:  [facility]  Status of Service:  Completed, signed off  Medicare Important Message Given:  Yes Date Medicare IM Given:    Medicare IM give by:    Date Additional Medicare IM Given:    Additional Medicare Important Message give by:     If discussed at [place] of Stay Meetings, dates discussed:    Additional Comments:  Carles Collet, RN 02/24/2015, 11:08 AM

## 2015-02-26 DIAGNOSIS — E119 Type 2 diabetes mellitus without complications: Secondary | ICD-10-CM | POA: Diagnosis not present

## 2015-02-26 DIAGNOSIS — J45909 Unspecified asthma, uncomplicated: Secondary | ICD-10-CM | POA: Diagnosis not present

## 2015-02-26 DIAGNOSIS — N189 Chronic kidney disease, unspecified: Secondary | ICD-10-CM | POA: Diagnosis not present

## 2015-02-26 DIAGNOSIS — I129 Hypertensive chronic kidney disease with stage 1 through stage 4 chronic kidney disease, or unspecified chronic kidney disease: Secondary | ICD-10-CM | POA: Diagnosis not present

## 2015-02-26 DIAGNOSIS — N179 Acute kidney failure, unspecified: Secondary | ICD-10-CM | POA: Diagnosis not present

## 2015-02-26 DIAGNOSIS — D509 Iron deficiency anemia, unspecified: Secondary | ICD-10-CM | POA: Diagnosis not present

## 2015-02-26 DIAGNOSIS — I2510 Atherosclerotic heart disease of native coronary artery without angina pectoris: Secondary | ICD-10-CM | POA: Diagnosis not present

## 2015-02-26 DIAGNOSIS — R1310 Dysphagia, unspecified: Secondary | ICD-10-CM | POA: Diagnosis not present

## 2015-02-26 DIAGNOSIS — K219 Gastro-esophageal reflux disease without esophagitis: Secondary | ICD-10-CM | POA: Diagnosis not present

## 2015-02-27 ENCOUNTER — Emergency Department (HOSPITAL_COMMUNITY): Payer: Medicare Other

## 2015-02-27 ENCOUNTER — Encounter (HOSPITAL_COMMUNITY): Payer: Self-pay | Admitting: Emergency Medicine

## 2015-02-27 ENCOUNTER — Emergency Department (HOSPITAL_COMMUNITY)
Admission: EM | Admit: 2015-02-27 | Discharge: 2015-02-27 | Disposition: A | Discharge status: Home / Self Care | Payer: Medicare Other | Attending: Emergency Medicine | Admitting: Emergency Medicine

## 2015-02-27 DIAGNOSIS — Z86010 Personal history of colonic polyps: Secondary | ICD-10-CM | POA: Insufficient documentation

## 2015-02-27 DIAGNOSIS — Z79899 Other long term (current) drug therapy: Secondary | ICD-10-CM | POA: Diagnosis not present

## 2015-02-27 DIAGNOSIS — M19042 Primary osteoarthritis, left hand: Secondary | ICD-10-CM | POA: Diagnosis not present

## 2015-02-27 DIAGNOSIS — J45909 Unspecified asthma, uncomplicated: Secondary | ICD-10-CM | POA: Diagnosis not present

## 2015-02-27 DIAGNOSIS — K219 Gastro-esophageal reflux disease without esophagitis: Secondary | ICD-10-CM | POA: Diagnosis not present

## 2015-02-27 DIAGNOSIS — R1084 Generalized abdominal pain: Secondary | ICD-10-CM | POA: Insufficient documentation

## 2015-02-27 DIAGNOSIS — R1013 Epigastric pain: Secondary | ICD-10-CM | POA: Diagnosis not present

## 2015-02-27 DIAGNOSIS — Z9851 Tubal ligation status: Secondary | ICD-10-CM | POA: Insufficient documentation

## 2015-02-27 DIAGNOSIS — E119 Type 2 diabetes mellitus without complications: Secondary | ICD-10-CM | POA: Diagnosis not present

## 2015-02-27 DIAGNOSIS — Z794 Long term (current) use of insulin: Secondary | ICD-10-CM | POA: Insufficient documentation

## 2015-02-27 DIAGNOSIS — R109 Unspecified abdominal pain: Secondary | ICD-10-CM | POA: Diagnosis not present

## 2015-02-27 DIAGNOSIS — Z87891 Personal history of nicotine dependence: Secondary | ICD-10-CM | POA: Diagnosis not present

## 2015-02-27 DIAGNOSIS — Z7952 Long term (current) use of systemic steroids: Secondary | ICD-10-CM | POA: Diagnosis not present

## 2015-02-27 DIAGNOSIS — Z9861 Coronary angioplasty status: Secondary | ICD-10-CM | POA: Diagnosis not present

## 2015-02-27 DIAGNOSIS — Z7984 Long term (current) use of oral hypoglycemic drugs: Secondary | ICD-10-CM | POA: Diagnosis not present

## 2015-02-27 DIAGNOSIS — I25119 Atherosclerotic heart disease of native coronary artery with unspecified angina pectoris: Secondary | ICD-10-CM | POA: Diagnosis not present

## 2015-02-27 DIAGNOSIS — R197 Diarrhea, unspecified: Secondary | ICD-10-CM | POA: Diagnosis not present

## 2015-02-27 DIAGNOSIS — Z7951 Long term (current) use of inhaled steroids: Secondary | ICD-10-CM | POA: Insufficient documentation

## 2015-02-27 DIAGNOSIS — D509 Iron deficiency anemia, unspecified: Secondary | ICD-10-CM | POA: Diagnosis not present

## 2015-02-27 DIAGNOSIS — Z9889 Other specified postprocedural states: Secondary | ICD-10-CM | POA: Diagnosis not present

## 2015-02-27 DIAGNOSIS — Z7982 Long term (current) use of aspirin: Secondary | ICD-10-CM | POA: Diagnosis not present

## 2015-02-27 DIAGNOSIS — I10 Essential (primary) hypertension: Secondary | ICD-10-CM | POA: Insufficient documentation

## 2015-02-27 DIAGNOSIS — M19041 Primary osteoarthritis, right hand: Secondary | ICD-10-CM | POA: Insufficient documentation

## 2015-02-27 DIAGNOSIS — E785 Hyperlipidemia, unspecified: Secondary | ICD-10-CM | POA: Diagnosis not present

## 2015-02-27 DIAGNOSIS — R112 Nausea with vomiting, unspecified: Secondary | ICD-10-CM | POA: Diagnosis not present

## 2015-02-27 DIAGNOSIS — I252 Old myocardial infarction: Secondary | ICD-10-CM | POA: Diagnosis not present

## 2015-02-27 DIAGNOSIS — Z792 Long term (current) use of antibiotics: Secondary | ICD-10-CM | POA: Insufficient documentation

## 2015-02-27 DIAGNOSIS — R1110 Vomiting, unspecified: Secondary | ICD-10-CM | POA: Diagnosis not present

## 2015-02-27 LAB — URINALYSIS, ROUTINE W REFLEX MICROSCOPIC
BILIRUBIN URINE: NEGATIVE
Glucose, UA: NEGATIVE mg/dL
Hgb urine dipstick: NEGATIVE
KETONES UR: NEGATIVE mg/dL
NITRITE: NEGATIVE
PH: 8 (ref 5.0–8.0)
PROTEIN: NEGATIVE mg/dL
Specific Gravity, Urine: 1.019 (ref 1.005–1.030)

## 2015-02-27 LAB — COMPREHENSIVE METABOLIC PANEL
ALT: 85 U/L — ABNORMAL HIGH (ref 14–54)
ANION GAP: 11 (ref 5–15)
AST: 42 U/L — ABNORMAL HIGH (ref 15–41)
Albumin: 4.1 g/dL (ref 3.5–5.0)
Alkaline Phosphatase: 102 U/L (ref 38–126)
BILIRUBIN TOTAL: 0.9 mg/dL (ref 0.3–1.2)
BUN: 17 mg/dL (ref 6–20)
CO2: 31 mmol/L (ref 22–32)
Calcium: 10.5 mg/dL — ABNORMAL HIGH (ref 8.9–10.3)
Chloride: 101 mmol/L (ref 101–111)
Creatinine, Ser: 1.14 mg/dL — ABNORMAL HIGH (ref 0.44–1.00)
GFR calc non Af Amer: 49 mL/min — ABNORMAL LOW (ref >60)
GFR, EST AFRICAN AMERICAN: 56 mL/min — AB (ref >60)
GLUCOSE: 78 mg/dL (ref 65–99)
POTASSIUM: 5.1 mmol/L (ref 3.5–5.1)
SODIUM: 143 mmol/L (ref 135–145)
TOTAL PROTEIN: 8.5 g/dL — AB (ref 6.5–8.1)

## 2015-02-27 LAB — I-STAT TROPONIN, ED: Troponin i, poc: 0 ng/mL (ref 0.00–0.08)

## 2015-02-27 LAB — CBC WITH DIFFERENTIAL/PLATELET
BASOS ABS: 0 10*3/uL (ref 0.0–0.1)
Basophils Relative: 0 %
EOS PCT: 1 %
Eosinophils Absolute: 0.1 10*3/uL (ref 0.0–0.7)
HCT: 43.8 % (ref 36.0–46.0)
HEMOGLOBIN: 13.5 g/dL (ref 12.0–15.0)
LYMPHS ABS: 1.7 10*3/uL (ref 0.7–4.0)
LYMPHS PCT: 18 %
MCH: 29.3 pg (ref 26.0–34.0)
MCHC: 30.8 g/dL (ref 30.0–36.0)
MCV: 95 fL (ref 78.0–100.0)
Monocytes Absolute: 1 10*3/uL (ref 0.1–1.0)
Monocytes Relative: 10 %
NEUTROS ABS: 6.5 10*3/uL (ref 1.7–7.7)
NEUTROS PCT: 71 %
PLATELETS: 288 10*3/uL (ref 150–400)
RBC: 4.61 MIL/uL (ref 3.87–5.11)
RDW: 16.2 % — ABNORMAL HIGH (ref 11.5–15.5)
WBC: 9.4 10*3/uL (ref 4.0–10.5)

## 2015-02-27 LAB — URINE MICROSCOPIC-ADD ON
BACTERIA UA: NONE SEEN
RBC / HPF: NONE SEEN RBC/hpf (ref 0–5)

## 2015-02-27 LAB — LIPASE, BLOOD: Lipase: 28 U/L (ref 11–51)

## 2015-02-27 MED ORDER — SODIUM CHLORIDE 0.9 % IV BOLUS (SEPSIS)
500.0000 mL | Freq: Once | INTRAVENOUS | Status: AC
  Administered 2015-02-27: 500 mL via INTRAVENOUS

## 2015-02-27 MED ORDER — HYDROMORPHONE HCL 1 MG/ML IJ SOLN
1.0000 mg | Freq: Once | INTRAMUSCULAR | Status: AC
  Administered 2015-02-27: 1 mg via INTRAVENOUS
  Filled 2015-02-27: qty 1

## 2015-02-27 MED ORDER — SUCRALFATE 1 GM/10ML PO SUSP
1.0000 g | Freq: Three times a day (TID) | ORAL | Status: DC
  Administered 2015-02-27: 1 g via ORAL
  Filled 2015-02-27 (×2): qty 10

## 2015-02-27 MED ORDER — LOPERAMIDE HCL 2 MG PO CAPS: 2.0000 mg | ORAL_CAPSULE | Freq: Four times a day (QID) | ORAL | Status: DC | PRN

## 2015-02-27 MED ORDER — DICYCLOMINE HCL 20 MG PO TABS
20.0000 mg | ORAL_TABLET | Freq: Two times a day (BID) | ORAL | Status: DC
Start: 2015-02-27 — End: 2017-07-04

## 2015-02-27 NOTE — ED Provider Notes (Signed)
Recent admission to the hospital for diarrhea, since being discharged 3 days ago she states that she has had recurrence of the diarrhea. On exam she has mental tenderness of her abdomen, mild tympanitic sounds, increased bowel sounds, very soft, clear heart and lung sounds without tachycardia. No peripheral edema.  Workup was extensive including workup for infectious etiology, imaging, no evident etiology was found, the patient will likely need to have acute abdominal series to rule out bowel pathology since being discharged O doubt obstruction as the patient is having diarrhea. Would also evaluated with labs to rule out electrolyte abnormalities or dehydration or kidney dysfunction. Patient does not appear toxic.  ED ECG REPORT  I personally interpreted this EKG   Date: 02/28/2015   Rate: 75  Rhythm: normal sinus rhythm  QRS Axis: normal  Intervals: normal  ST/T Wave abnormalities: normal  Conduction Disutrbances:none  Narrative Interpretation:   Old EKG Reviewed: none available  Medical screening examination/treatment/procedure(s) were conducted as a shared visit with non-physician practitioner(s) and myself.  I personally evaluated the patient during the encounter.  Clinical Impression:   Final diagnoses:  Abdominal pain         Noemi Chapel, MD 02/28/15 1336

## 2015-02-27 NOTE — Discharge Instructions (Signed)
1. Medications: immodium, bentyl, probiotic, usual home medications 2. Treatment: rest, drink plenty of fluids 3. Follow Up: please followup with GI this week for discussion of your diagnoses and further evaluation after today's visit; if you do not have a primary care doctor use the resource guide provided to find one; please return to the ER for high fever, severe pain, persistent vomiting, bloody stools, new or worsening symptoms   Abdominal Pain, Adult Many things can cause abdominal pain. Usually, abdominal pain is not caused by a disease and will improve without treatment. It can often be observed and treated at home. Your health care provider will do a physical exam and possibly order blood tests and X-rays to help determine the seriousness of your pain. However, in many cases, more time must pass before a clear cause of the pain can be found. Before that point, your health care provider may not know if you need more testing or further treatment. HOME CARE INSTRUCTIONS Monitor your abdominal pain for any changes. The following actions may help to alleviate any discomfort you are experiencing:  Only take over-the-counter or prescription medicines as directed by your health care provider.  Do not take laxatives unless directed to do so by your health care provider.  Try a clear liquid diet (broth, tea, or water) as directed by your health care provider. Slowly move to a bland diet as tolerated. SEEK MEDICAL CARE IF:  You have unexplained abdominal pain.  You have abdominal pain associated with nausea or diarrhea.  You have pain when you urinate or have a bowel movement.  You experience abdominal pain that wakes you in the night.  You have abdominal pain that is worsened or improved by eating food.  You have abdominal pain that is worsened with eating fatty foods.  You have a fever. SEEK IMMEDIATE MEDICAL CARE IF:  Your pain does not go away within 2 hours.  You keep throwing up  (vomiting).  Your pain is felt only in portions of the abdomen, such as the right side or the left lower portion of the abdomen.  You pass bloody or black tarry stools. MAKE SURE YOU:  Understand these instructions.  Will watch your condition.  Will get help right away if you are not doing well or get worse.   This information is not intended to replace advice given to you by your health care provider. Make sure you discuss any questions you have with your health care provider.   Document Released: 11/01/2004 Document Revised: 10/13/2014 Document Reviewed: 10/01/2012 Elsevier Interactive Patient Education [employer].

## 2015-02-27 NOTE — ED Provider Notes (Signed)
CSN: [id number]     Arrival date & time 02/27/15  1254 History   None    Chief Complaint  Patient presents with  . Abdominal Pain    HPI   [name] is a 65 y.o. female with a PMH of HLD, HTN, DM, CAD, MI, GERD who presents to the ED with epigastric abdominal pain, which she states has been constant since [place]. She reports she was recently admitted to [facility] for the same symptoms, and was discharged on the 19th. She reports her pain has been persistent. She denies exacerbating or alleviating factors. She denies fever, chills, chest pain, shortness of breath. She reports associated nausea, vomiting, and diarrhea. She denies hematemesis, hematochezia, melena, dysuria, urgency, frequency.    Past Medical History  Diagnosis Date  . History of colonic polyps 2009    adenomatous 2009, HP in 2009, 2011, 2013.   Marland Kitchen GERD (gastroesophageal reflux disease)   . Hyperlipidemia   . Diabetes mellitus without mention of complication   . Hypertension   . Myocardial infarction ([place])   . Coronary artery disease   . Fatty liver disease, nonalcoholic [id number]    on Imaging.   . Cholelithiasis   . Iron deficiency anemia   . Angina   . Asthma   . Arthritis     HANDS"  . Cocaine abuse 07/2012    per [facility] drug screen  . Dysphagia     esophageal dysmotility on 03/2011 esophagram    Past Surgical History  Procedure Laterality Date  . Right colectomy  02/2007    for post polypectomy colonic perforation  . Tubal ligation    . Coronary angioplasty with stent placement    . Esophagogastroduodenoscopy  11/30/2011    Procedure: ESOPHAGOGASTRODUODENOSCOPY (EGD);  Surgeon: Lafayette Dragon, MD;  Location: [facility] ENDOS[facility];  Service: Endoscopy;  Laterality: N/A;  . Colonoscopy  11/30/2011    Procedure: COLONOSCOPY;  Surgeon: Lafayette Dragon, MD;  Location: [facility] ENDOSCOPY;  Service: Endoscopy;  Laterality: N/A;  . Left heart catheterization with coronary angiogram N/A 02/20/2011    Procedure: LEFT HEART  CATHETERIZATION WITH CORONARY ANGIOGRAM;  Surgeon: Clent Demark, MD;  Location: [facility] CATH LAB;  Service: Cardiovascular;  Laterality: N/A;  . Abdominal angiogram  02/20/2011    Procedure: ABDOMINAL ANGIOGRAM;  Surgeon: Clent Demark, MD;  Location: [facility] CATH LAB;  Service: Cardiovascular;;  . Esophagogastroduodenoscopy N/A 02/23/2015    Procedure: ESOPHAGOGASTRODUODENOSCOPY (EGD);  Surgeon: Gatha Mayer, MD;  Location: [facility] ENDOSCOPY;  Service: Endoscopy;  Laterality: N/A;   Family History  Problem Relation Age of Onset  . Heart disease Mother   . Diabetes Mother   . Colon cancer Neg Hx   . Cancer Father     unsure what kind  . Diabetes Sister    Social History  Substance Use Topics  . Smoking status: Former Smoker -- 0.50 packs/day for 47 years    Types: Cigarettes    Quit date: 05/09/2010  . Smokeless tobacco: Never Used  . Alcohol Use: No   OB History    No data available      Review of Systems  Constitutional: Negative for fever and chills.  Respiratory: Negative for shortness of breath.   Cardiovascular: Negative for chest pain.  Gastrointestinal: Positive for nausea, vomiting, abdominal pain and diarrhea. Negative for constipation and blood in stool.  Genitourinary: Negative for dysuria, urgency and frequency.  All other systems reviewed and are negative.     Allergies  Review  of patient's allergies indicates no known allergies.  Home Medications   Prior to Admission medications   Medication Sig Start Date End Date Taking? Authorizing Provider  acetaminophen (TYLENOL) 500 MG tablet Take 500 mg by mouth every 6 (six) hours as needed for mild pain.   Yes Historical Provider, MD  albuterol (PROVENTIL) (2.5 MG/3ML) 0.083% nebulizer solution Take 2.5 mg by nebulization 2 (two) times daily.   Yes Historical Provider, MD  ALPRAZolam (XANAX) 0.25 MG tablet Take 0.25 mg by mouth 2 (two) times daily.    Yes Historical Provider, MD  amLODipine (NORVASC) 5 MG tablet Take 5  mg by mouth daily.   Yes Historical Provider, MD  aspirin EC 81 MG EC tablet Take 1 tablet (81 mg total) by mouth daily. 02/06/12  Yes Patrecia Pour, NP  atorvastatin (LIPITOR) 20 MG tablet Take 20 mg by mouth daily.   Yes Historical Provider, MD  budesonide-formoterol (SYMBICORT) 160-4.5 MCG/ACT inhaler Inhale 2 puffs into the lungs 2 (two) times daily.   Yes Historical Provider, MD  cromolyn (OPTICROM) 4 % ophthalmic solution Place 1 drop into both eyes 4 (four) times daily.   Yes Historical Provider, MD  diphenoxylate-atropine (LOMOTIL) 2.5-0.025 MG per tablet Take 1-2 tablets by mouth See admin instructions. Take 2 tablets by mouth on onset of diarrhea, repeat in one hour; the take 1 tablet three times daily until diarrhea stops   Yes Historical Provider, MD  DULoxetine (CYMBALTA) 30 MG capsule Take 1 capsule (30 mg total) by mouth daily. 02/24/15  Yes Shanker Kristeen Mans, MD  feeding supplement, ENSURE ENLIVE, (ENSURE ENLIVE) LIQD Take 237 mLs by mouth 2 (two) times daily between meals. 02/24/15  Yes Shanker Kristeen Mans, MD  gatifloxacin (ZYMAXID) 0.5 % SOLN Place 1 drop into both eyes 4 (four) times daily.   Yes Historical Provider, MD  HYDROcodone-acetaminophen (NORCO/VICODIN) 5-325 MG tablet Take 1-2 tablets by mouth every 6 (six) hours as needed for moderate pain. 02/24/15  Yes Shanker Kristeen Mans, MD  Insulin Glargine (LANTUS SOLOSTAR) 100 UNIT/ML Solostar Pen Inject 20 Units into the skin daily at 10 pm. Patient taking differently: Inject 40 Units into the skin 2 (two) times daily.  02/24/15  Yes Shanker Kristeen Mans, MD  Iron-FA-B Cmp-C-Biot-Probiotic (FUSION PLUS PO) Take 1 capsule by mouth daily.   Yes Historical Provider, MD  ketorolac (ACULAR) 0.4 % SOLN Place 1 drop into both eyes 4 (four) times daily.   Yes Historical Provider, MD  metFORMIN (GLUCOPHAGE) 1000 MG tablet Take 1,000 mg by mouth 2 (two) times daily with a meal.   Yes Historical Provider, MD  metoprolol succinate (TOPROL-XL) 50 MG 24  hr tablet Take 50 mg by mouth every evening. Take with or immediately following a meal.   Yes Historical Provider, MD  NAFTIN 2 % CREA APPLY TO AFFECTED AREA TWO TIMES DAILY to the feet. 02/04/15  Yes Historical Provider, MD  nitroGLYCERIN (NITROSTAT) 0.4 MG SL tablet Place 1 tablet (0.4 mg total) under the tongue every 5 (five) minutes as needed. For chest pain Patient taking differently: Place 0.4 mg under the tongue every 5 (five) minutes as needed for chest pain.  02/06/12  Yes Patrecia Pour, NP  pantoprazole (PROTONIX) 40 MG tablet Take 1 tablet (40 mg total) by mouth daily. 02/24/15  Yes Shanker Kristeen Mans, MD  prednisoLONE acetate (PRED FORTE) 1 % ophthalmic suspension Place 1 drop into both eyes 4 (four) times daily.    Yes Historical Provider, MD  dicyclomine (  BENTYL) 20 MG tablet Take 1 tablet (20 mg total) by mouth 2 (two) times daily. 02/27/15   Marella Chimes, PA-C  loperamide (IMODIUM) 2 MG capsule Take 1 capsule (2 mg total) by mouth 4 (four) times daily as needed for diarrhea or loose stools. 02/27/15   Marella Chimes, PA-C  sucralfate (CARAFATE) 1 GM/10ML suspension Take 10 mLs (1 g total) by mouth 4 (four) times daily -  with meals and at bedtime. 02/24/15   Shanker Kristeen Mans, MD    BP 176/75 mmHg  Pulse 62  Temp(Src) 98.2 F (36.8 C) (Oral)  Resp 15  Ht 4\' 11"  (1.499 m)  Wt 56.246 kg  BMI 25.03 kg/m2  SpO2 99% Physical Exam  Constitutional: She is oriented to person, place, and time. She appears well-developed and well-nourished. No distress.  HENT:  Head: Normocephalic and atraumatic.  Right Ear: External ear normal.  Left Ear: External ear normal.  Nose: Nose normal.  Mouth/Throat: Uvula is midline, oropharynx is clear and moist and mucous membranes are normal.  Eyes: Conjunctivae, EOM and lids are normal. Pupils are equal, round, and reactive to light. Right eye exhibits no discharge. Left eye exhibits no discharge. No scleral icterus.  Neck: Normal range of  motion. Neck supple.  Cardiovascular: Normal rate, regular rhythm, normal heart sounds, intact distal pulses and normal pulses.   Pulmonary/Chest: Effort normal and breath sounds normal. No respiratory distress. She has no wheezes. She has no rales.  Abdominal: Soft. Normal appearance and bowel sounds are normal. She exhibits no distension and no mass. There is tenderness. There is no rigidity, no rebound and no guarding.  Diffuse TTP of abdomen. No rebound, guarding, or masses.  Musculoskeletal: Normal range of motion. She exhibits no edema or tenderness.  Neurological: She is alert and oriented to person, place, and time. She has normal strength. No cranial nerve deficit or sensory deficit.  Skin: Skin is warm, dry and intact. No rash noted. She is not diaphoretic. No erythema. No pallor.  Psychiatric: She has a normal mood and affect. Her speech is normal and behavior is normal.  Nursing note and vitals reviewed.   ED Course  Procedures (including critical care time)  Labs Review Labs Reviewed  CBC WITH DIFFERENTIAL/PLATELET - Abnormal; Notable for the following:    RDW 16.2 (*)    All other components within normal limits  COMPREHENSIVE METABOLIC PANEL - Abnormal; Notable for the following:    Creatinine, Ser 1.14 (*)    Calcium 10.5 (*)    Total Protein 8.5 (*)    AST 42 (*)    ALT 85 (*)    GFR calc non Af Amer 49 (*)    GFR calc Af Amer 56 (*)    All other components within normal limits  URINALYSIS, ROUTINE W REFLEX MICROSCOPIC (NOT AT [facility]) - Abnormal; Notable for the following:    Leukocytes, UA SMALL (*)    All other components within normal limits  URINE MICROSCOPIC-ADD ON - Abnormal; Notable for the following:    Squamous Epithelial / LPF 0-5 (*)    All other components within normal limits  LIPASE, BLOOD  I-STAT TROPOININ, ED    Imaging Review Dg Abd Acute W/chest  02/27/2015  CLINICAL DATA:  Epigastric abdominal pain and nausea and vomiting for 1 day. EXAM: DG  ABDOMEN ACUTE W/ 1V CHEST COMPARISON:  Radiographs 02/21/2015. FINDINGS: The upright chest x-ray demonstrates stable borderline cardiac enlargement and tortuosity, ectasia and calcification of the thoracic  aorta. No acute pulmonary findings. Two views of the abdomen demonstrate an unremarkable bowel gas pattern. No findings for obstruction or perforation. The soft tissue shadows are grossly maintained. The bony structures are intact. Stable vascular calcifications. IMPRESSION: No acute cardiopulmonary findings. No plain film findings for an acute abdominal process. Electronically Signed   By: Marijo Sanes M.D.   On: 02/27/2015 17:40     I have personally reviewed and evaluated these images and lab results as part of my medical decision-making.   EKG Interpretation None      MDM   Final diagnoses:  Abdominal pain    65 year old female presents with epigastric abdominal pain. She reports associated N/V/D. Per record review, patient admitted from 1/13-1/19 for abdominal pain and diarrhea. During her admission, CT abdomen pelvis and EGD were negative, pain thought to be neuropathic or 2/2 IBS. Additionally, C diff PCR and GI pathogen panel was negative.   Patient is afebrile. Vital signs stable. Patient stutters, though states this is baseline (per record review, stuttering documented to be baseline). Abdomen soft, non-distended, with mild diffuse TTP. No rebound, guarding, or masses.  Will give fluids and pain medication. Patient declines antiemetic.  CBC negative for leukocytosis or anemia. CMP remarkable for creatinine 1.14, which appears consistent with baseline. Lipase within normal limits. UA negative for infection. Acute abdomen with chest negative for acute abdominal process or acute cardiopulmonary findings. Spoke with patient regarding results.   Patient discussed with and seen by Dr. Sabra Heck. On reassessment, she reports symptom improvement. Repeat abdominal exam benign. Patient is  non-toxic and well-appearing, feel she is stable for discharge at this time. Will give imodium and bentyl and advised to use probiotic. Patient to follow-up with GI. Return precautions discussed. Patient verbalizes her understanding and is in agreement with plan.  BP 176/75 mmHg  Pulse 62  Temp(Src) 98.2 F (36.8 C) (Oral)  Resp 15  Ht 4\' 11"  (1.499 m)  Wt 56.246 kg  BMI 25.03 kg/m2  SpO2 99%     Marella Chimes, PA-C 02/27/15 1922  Noemi Chapel, MD 02/28/15 1336

## 2015-02-27 NOTE — ED Notes (Signed)
Pt arrived via EMS with report of epigastic pain, dark loose stools, and n/v x2 in past 24 hrs. Pt was seen at [facility] x2 days ago for same problem.

## 2015-03-01 ENCOUNTER — Telehealth: Payer: Self-pay | Admitting: Internal Medicine

## 2015-03-01 DIAGNOSIS — D509 Iron deficiency anemia, unspecified: Secondary | ICD-10-CM | POA: Diagnosis not present

## 2015-03-01 DIAGNOSIS — N179 Acute kidney failure, unspecified: Secondary | ICD-10-CM | POA: Diagnosis not present

## 2015-03-01 DIAGNOSIS — N189 Chronic kidney disease, unspecified: Secondary | ICD-10-CM | POA: Diagnosis not present

## 2015-03-01 DIAGNOSIS — I2510 Atherosclerotic heart disease of native coronary artery without angina pectoris: Secondary | ICD-10-CM | POA: Diagnosis not present

## 2015-03-01 DIAGNOSIS — R1310 Dysphagia, unspecified: Secondary | ICD-10-CM | POA: Diagnosis not present

## 2015-03-01 DIAGNOSIS — K219 Gastro-esophageal reflux disease without esophagitis: Secondary | ICD-10-CM | POA: Diagnosis not present

## 2015-03-01 DIAGNOSIS — J45909 Unspecified asthma, uncomplicated: Secondary | ICD-10-CM | POA: Diagnosis not present

## 2015-03-01 DIAGNOSIS — E119 Type 2 diabetes mellitus without complications: Secondary | ICD-10-CM | POA: Diagnosis not present

## 2015-03-01 DIAGNOSIS — I129 Hypertensive chronic kidney disease with stage 1 through stage 4 chronic kidney disease, or unspecified chronic kidney disease: Secondary | ICD-10-CM | POA: Diagnosis not present

## 2015-03-01 NOTE — Telephone Encounter (Signed)
Spoke with patient and she states she went to ED after discharge and was told to call GI. Per Dr. Celesta Aver note after procedure, patient should f/u with PCP. Patient states she has an OV on 03/16/15 with PCP. She states she cannot go any earlier due to transportation.

## 2015-03-02 DIAGNOSIS — R1310 Dysphagia, unspecified: Secondary | ICD-10-CM | POA: Diagnosis not present

## 2015-03-02 DIAGNOSIS — K219 Gastro-esophageal reflux disease without esophagitis: Secondary | ICD-10-CM | POA: Diagnosis not present

## 2015-03-02 DIAGNOSIS — I2510 Atherosclerotic heart disease of native coronary artery without angina pectoris: Secondary | ICD-10-CM | POA: Diagnosis not present

## 2015-03-02 DIAGNOSIS — E119 Type 2 diabetes mellitus without complications: Secondary | ICD-10-CM | POA: Diagnosis not present

## 2015-03-02 DIAGNOSIS — J45909 Unspecified asthma, uncomplicated: Secondary | ICD-10-CM | POA: Diagnosis not present

## 2015-03-02 DIAGNOSIS — I129 Hypertensive chronic kidney disease with stage 1 through stage 4 chronic kidney disease, or unspecified chronic kidney disease: Secondary | ICD-10-CM | POA: Diagnosis not present

## 2015-03-02 DIAGNOSIS — N189 Chronic kidney disease, unspecified: Secondary | ICD-10-CM | POA: Diagnosis not present

## 2015-03-02 DIAGNOSIS — D509 Iron deficiency anemia, unspecified: Secondary | ICD-10-CM | POA: Diagnosis not present

## 2015-03-02 DIAGNOSIS — N179 Acute kidney failure, unspecified: Secondary | ICD-10-CM | POA: Diagnosis not present

## 2015-03-03 DIAGNOSIS — E119 Type 2 diabetes mellitus without complications: Secondary | ICD-10-CM | POA: Diagnosis not present

## 2015-03-03 DIAGNOSIS — I2510 Atherosclerotic heart disease of native coronary artery without angina pectoris: Secondary | ICD-10-CM | POA: Diagnosis not present

## 2015-03-03 DIAGNOSIS — R1310 Dysphagia, unspecified: Secondary | ICD-10-CM | POA: Diagnosis not present

## 2015-03-03 DIAGNOSIS — N179 Acute kidney failure, unspecified: Secondary | ICD-10-CM | POA: Diagnosis not present

## 2015-03-03 DIAGNOSIS — K219 Gastro-esophageal reflux disease without esophagitis: Secondary | ICD-10-CM | POA: Diagnosis not present

## 2015-03-03 DIAGNOSIS — D509 Iron deficiency anemia, unspecified: Secondary | ICD-10-CM | POA: Diagnosis not present

## 2015-03-03 DIAGNOSIS — I129 Hypertensive chronic kidney disease with stage 1 through stage 4 chronic kidney disease, or unspecified chronic kidney disease: Secondary | ICD-10-CM | POA: Diagnosis not present

## 2015-03-03 DIAGNOSIS — J45909 Unspecified asthma, uncomplicated: Secondary | ICD-10-CM | POA: Diagnosis not present

## 2015-03-03 DIAGNOSIS — N189 Chronic kidney disease, unspecified: Secondary | ICD-10-CM | POA: Diagnosis not present

## 2015-03-04 DIAGNOSIS — J45909 Unspecified asthma, uncomplicated: Secondary | ICD-10-CM | POA: Diagnosis not present

## 2015-03-04 DIAGNOSIS — R1310 Dysphagia, unspecified: Secondary | ICD-10-CM | POA: Diagnosis not present

## 2015-03-04 DIAGNOSIS — K219 Gastro-esophageal reflux disease without esophagitis: Secondary | ICD-10-CM | POA: Diagnosis not present

## 2015-03-04 DIAGNOSIS — D509 Iron deficiency anemia, unspecified: Secondary | ICD-10-CM | POA: Diagnosis not present

## 2015-03-04 DIAGNOSIS — N189 Chronic kidney disease, unspecified: Secondary | ICD-10-CM | POA: Diagnosis not present

## 2015-03-04 DIAGNOSIS — E119 Type 2 diabetes mellitus without complications: Secondary | ICD-10-CM | POA: Diagnosis not present

## 2015-03-04 DIAGNOSIS — I2510 Atherosclerotic heart disease of native coronary artery without angina pectoris: Secondary | ICD-10-CM | POA: Diagnosis not present

## 2015-03-04 DIAGNOSIS — N179 Acute kidney failure, unspecified: Secondary | ICD-10-CM | POA: Diagnosis not present

## 2015-03-04 DIAGNOSIS — I129 Hypertensive chronic kidney disease with stage 1 through stage 4 chronic kidney disease, or unspecified chronic kidney disease: Secondary | ICD-10-CM | POA: Diagnosis not present

## 2015-03-08 DIAGNOSIS — I129 Hypertensive chronic kidney disease with stage 1 through stage 4 chronic kidney disease, or unspecified chronic kidney disease: Secondary | ICD-10-CM | POA: Diagnosis not present

## 2015-03-08 DIAGNOSIS — N179 Acute kidney failure, unspecified: Secondary | ICD-10-CM | POA: Diagnosis not present

## 2015-03-08 DIAGNOSIS — K219 Gastro-esophageal reflux disease without esophagitis: Secondary | ICD-10-CM | POA: Diagnosis not present

## 2015-03-08 DIAGNOSIS — N189 Chronic kidney disease, unspecified: Secondary | ICD-10-CM | POA: Diagnosis not present

## 2015-03-08 DIAGNOSIS — E119 Type 2 diabetes mellitus without complications: Secondary | ICD-10-CM | POA: Diagnosis not present

## 2015-03-08 DIAGNOSIS — J45909 Unspecified asthma, uncomplicated: Secondary | ICD-10-CM | POA: Diagnosis not present

## 2015-03-08 DIAGNOSIS — D509 Iron deficiency anemia, unspecified: Secondary | ICD-10-CM | POA: Diagnosis not present

## 2015-03-08 DIAGNOSIS — I2510 Atherosclerotic heart disease of native coronary artery without angina pectoris: Secondary | ICD-10-CM | POA: Diagnosis not present

## 2015-03-08 DIAGNOSIS — R1310 Dysphagia, unspecified: Secondary | ICD-10-CM | POA: Diagnosis not present

## 2015-03-09 DIAGNOSIS — I129 Hypertensive chronic kidney disease with stage 1 through stage 4 chronic kidney disease, or unspecified chronic kidney disease: Secondary | ICD-10-CM | POA: Diagnosis not present

## 2015-03-09 DIAGNOSIS — N179 Acute kidney failure, unspecified: Secondary | ICD-10-CM | POA: Diagnosis not present

## 2015-03-09 DIAGNOSIS — D509 Iron deficiency anemia, unspecified: Secondary | ICD-10-CM | POA: Diagnosis not present

## 2015-03-09 DIAGNOSIS — I2510 Atherosclerotic heart disease of native coronary artery without angina pectoris: Secondary | ICD-10-CM | POA: Diagnosis not present

## 2015-03-09 DIAGNOSIS — N189 Chronic kidney disease, unspecified: Secondary | ICD-10-CM | POA: Diagnosis not present

## 2015-03-09 DIAGNOSIS — J45909 Unspecified asthma, uncomplicated: Secondary | ICD-10-CM | POA: Diagnosis not present

## 2015-03-09 DIAGNOSIS — R1310 Dysphagia, unspecified: Secondary | ICD-10-CM | POA: Diagnosis not present

## 2015-03-09 DIAGNOSIS — E119 Type 2 diabetes mellitus without complications: Secondary | ICD-10-CM | POA: Diagnosis not present

## 2015-03-09 DIAGNOSIS — K219 Gastro-esophageal reflux disease without esophagitis: Secondary | ICD-10-CM | POA: Diagnosis not present

## 2015-03-10 DIAGNOSIS — N179 Acute kidney failure, unspecified: Secondary | ICD-10-CM | POA: Diagnosis not present

## 2015-03-10 DIAGNOSIS — R1310 Dysphagia, unspecified: Secondary | ICD-10-CM | POA: Diagnosis not present

## 2015-03-10 DIAGNOSIS — E119 Type 2 diabetes mellitus without complications: Secondary | ICD-10-CM | POA: Diagnosis not present

## 2015-03-10 DIAGNOSIS — J45909 Unspecified asthma, uncomplicated: Secondary | ICD-10-CM | POA: Diagnosis not present

## 2015-03-10 DIAGNOSIS — D509 Iron deficiency anemia, unspecified: Secondary | ICD-10-CM | POA: Diagnosis not present

## 2015-03-10 DIAGNOSIS — I129 Hypertensive chronic kidney disease with stage 1 through stage 4 chronic kidney disease, or unspecified chronic kidney disease: Secondary | ICD-10-CM | POA: Diagnosis not present

## 2015-03-10 DIAGNOSIS — I2510 Atherosclerotic heart disease of native coronary artery without angina pectoris: Secondary | ICD-10-CM | POA: Diagnosis not present

## 2015-03-10 DIAGNOSIS — K219 Gastro-esophageal reflux disease without esophagitis: Secondary | ICD-10-CM | POA: Diagnosis not present

## 2015-03-10 DIAGNOSIS — N189 Chronic kidney disease, unspecified: Secondary | ICD-10-CM | POA: Diagnosis not present

## 2015-03-11 DIAGNOSIS — E119 Type 2 diabetes mellitus without complications: Secondary | ICD-10-CM | POA: Diagnosis not present

## 2015-03-11 DIAGNOSIS — R0609 Other forms of dyspnea: Secondary | ICD-10-CM | POA: Diagnosis not present

## 2015-03-11 DIAGNOSIS — I25118 Atherosclerotic heart disease of native coronary artery with other forms of angina pectoris: Secondary | ICD-10-CM | POA: Diagnosis not present

## 2015-03-11 DIAGNOSIS — I10 Essential (primary) hypertension: Secondary | ICD-10-CM | POA: Diagnosis not present

## 2015-03-11 DIAGNOSIS — E1140 Type 2 diabetes mellitus with diabetic neuropathy, unspecified: Secondary | ICD-10-CM | POA: Diagnosis not present

## 2015-03-15 DIAGNOSIS — N179 Acute kidney failure, unspecified: Secondary | ICD-10-CM | POA: Diagnosis not present

## 2015-03-15 DIAGNOSIS — D509 Iron deficiency anemia, unspecified: Secondary | ICD-10-CM | POA: Diagnosis not present

## 2015-03-15 DIAGNOSIS — K219 Gastro-esophageal reflux disease without esophagitis: Secondary | ICD-10-CM | POA: Diagnosis not present

## 2015-03-15 DIAGNOSIS — R1310 Dysphagia, unspecified: Secondary | ICD-10-CM | POA: Diagnosis not present

## 2015-03-15 DIAGNOSIS — E119 Type 2 diabetes mellitus without complications: Secondary | ICD-10-CM | POA: Diagnosis not present

## 2015-03-15 DIAGNOSIS — I2510 Atherosclerotic heart disease of native coronary artery without angina pectoris: Secondary | ICD-10-CM | POA: Diagnosis not present

## 2015-03-15 DIAGNOSIS — J45909 Unspecified asthma, uncomplicated: Secondary | ICD-10-CM | POA: Diagnosis not present

## 2015-03-15 DIAGNOSIS — I129 Hypertensive chronic kidney disease with stage 1 through stage 4 chronic kidney disease, or unspecified chronic kidney disease: Secondary | ICD-10-CM | POA: Diagnosis not present

## 2015-03-15 DIAGNOSIS — N189 Chronic kidney disease, unspecified: Secondary | ICD-10-CM | POA: Diagnosis not present

## 2015-03-16 DIAGNOSIS — Z0000 Encounter for general adult medical examination without abnormal findings: Secondary | ICD-10-CM | POA: Diagnosis not present

## 2015-03-16 DIAGNOSIS — J441 Chronic obstructive pulmonary disease with (acute) exacerbation: Secondary | ICD-10-CM | POA: Diagnosis not present

## 2015-03-16 DIAGNOSIS — E1100 Type 2 diabetes mellitus with hyperosmolarity without nonketotic hyperglycemic-hyperosmolar coma (NKHHC): Secondary | ICD-10-CM | POA: Diagnosis not present

## 2015-03-16 DIAGNOSIS — K210 Gastro-esophageal reflux disease with esophagitis: Secondary | ICD-10-CM | POA: Diagnosis not present

## 2015-03-18 DIAGNOSIS — K219 Gastro-esophageal reflux disease without esophagitis: Secondary | ICD-10-CM | POA: Diagnosis not present

## 2015-03-18 DIAGNOSIS — R1310 Dysphagia, unspecified: Secondary | ICD-10-CM | POA: Diagnosis not present

## 2015-03-18 DIAGNOSIS — N189 Chronic kidney disease, unspecified: Secondary | ICD-10-CM | POA: Diagnosis not present

## 2015-03-18 DIAGNOSIS — D509 Iron deficiency anemia, unspecified: Secondary | ICD-10-CM | POA: Diagnosis not present

## 2015-03-18 DIAGNOSIS — I129 Hypertensive chronic kidney disease with stage 1 through stage 4 chronic kidney disease, or unspecified chronic kidney disease: Secondary | ICD-10-CM | POA: Diagnosis not present

## 2015-03-18 DIAGNOSIS — E119 Type 2 diabetes mellitus without complications: Secondary | ICD-10-CM | POA: Diagnosis not present

## 2015-03-18 DIAGNOSIS — I2510 Atherosclerotic heart disease of native coronary artery without angina pectoris: Secondary | ICD-10-CM | POA: Diagnosis not present

## 2015-03-18 DIAGNOSIS — N179 Acute kidney failure, unspecified: Secondary | ICD-10-CM | POA: Diagnosis not present

## 2015-03-18 DIAGNOSIS — J45909 Unspecified asthma, uncomplicated: Secondary | ICD-10-CM | POA: Diagnosis not present

## 2015-03-18 IMAGING — CR DG CHEST 2V
2 series · 2 of 2 positions shown · non-contrast
Comparison: 05/12/2012

CLINICAL DATA: Chest pain for 8 hours

CHEST - 2 VIEW

[x chest ap]
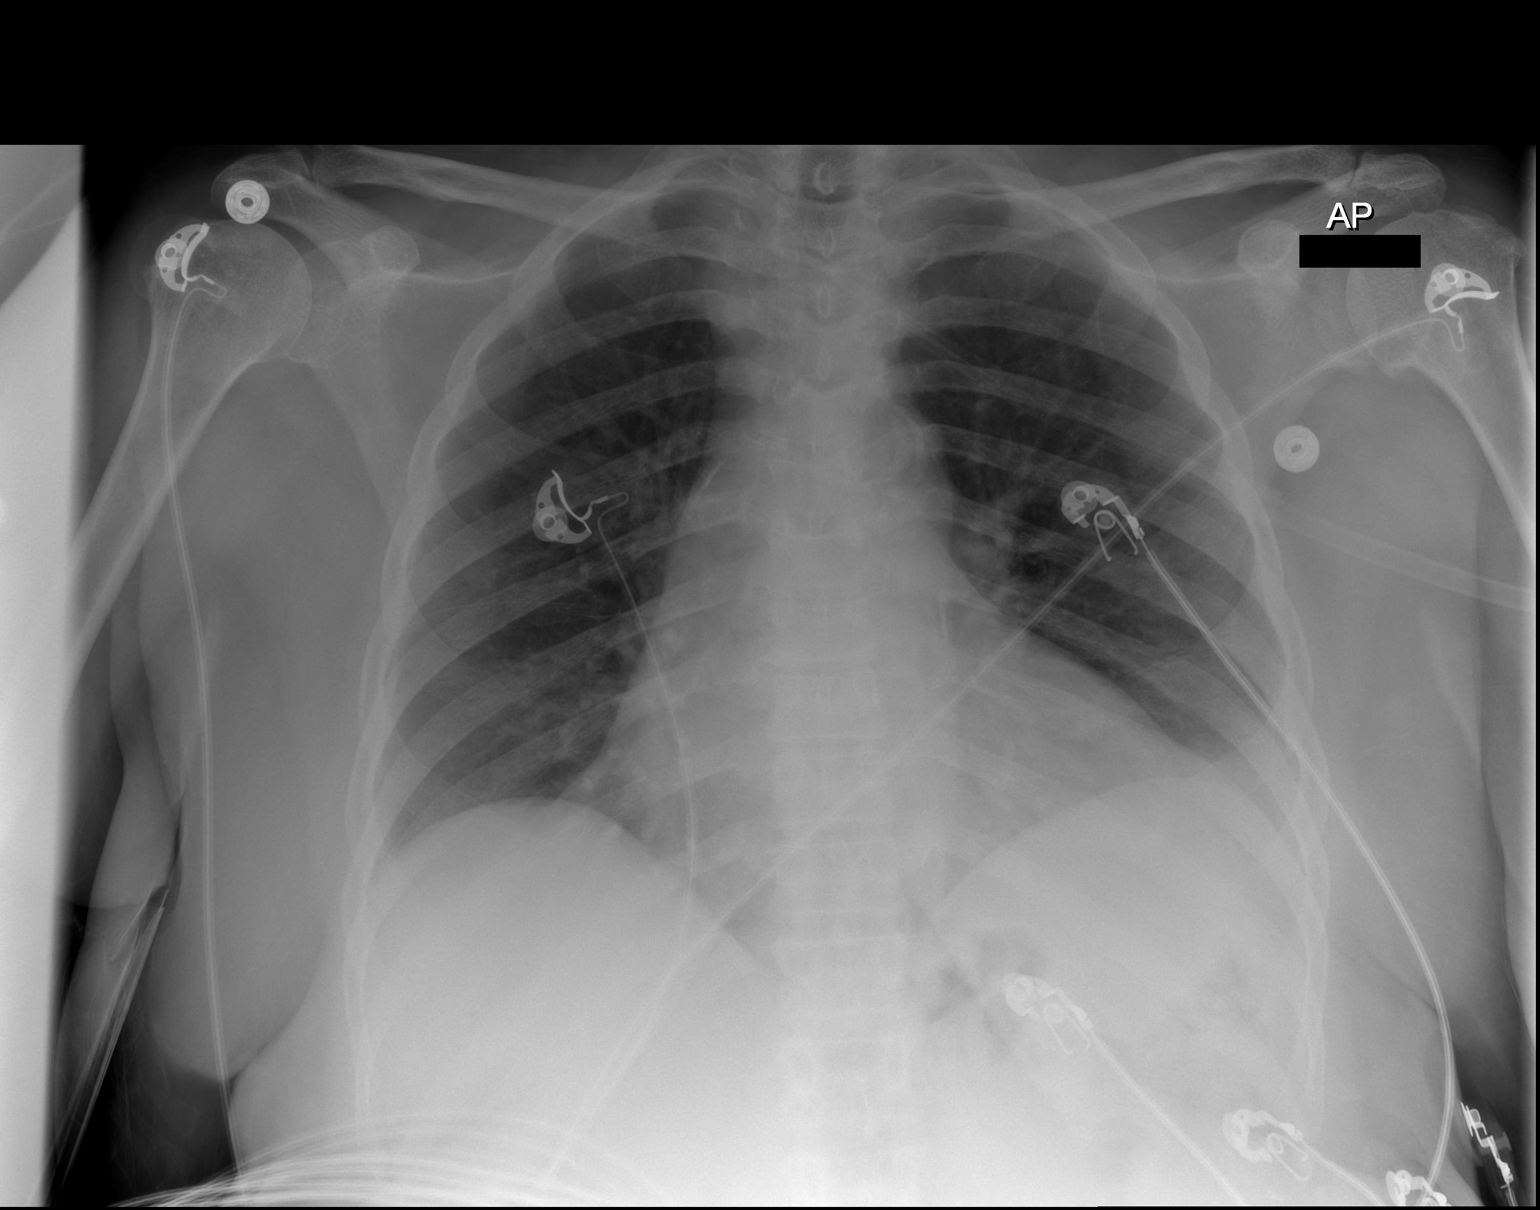

[w chest lat]
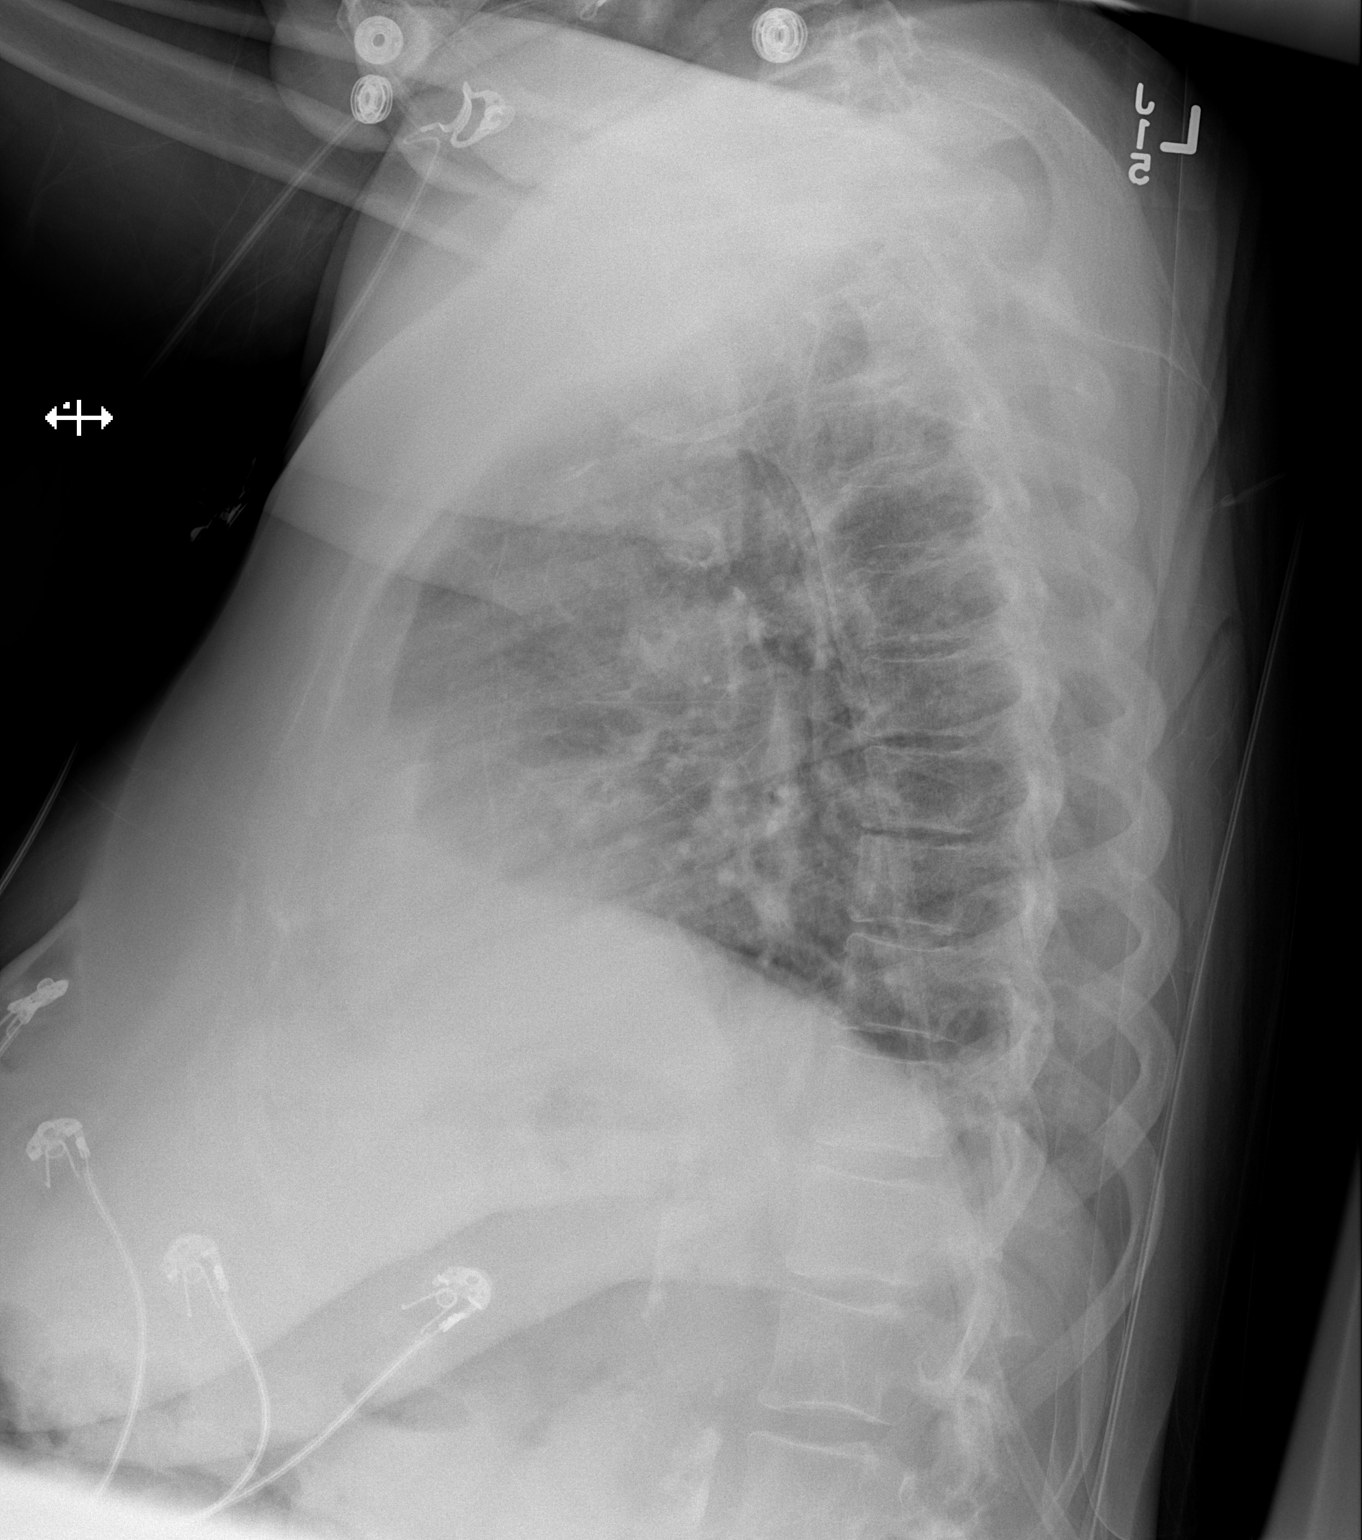

[2 of 2 positions shown; findings below may reference images not displayed]

FINDINGS: Cardiac leads overlie the chest.  Heart size is at upper
limits of normal.  Lung volumes are low but clear.  No pleural
effusion.  No acute osseous finding. Aortic atherosclerotic
calcification noted.
IMPRESSION: No acute cardiopulmonary process.

## 2015-03-30 DIAGNOSIS — I10 Essential (primary) hypertension: Secondary | ICD-10-CM | POA: Diagnosis not present

## 2015-03-30 DIAGNOSIS — E119 Type 2 diabetes mellitus without complications: Secondary | ICD-10-CM | POA: Diagnosis not present

## 2015-03-30 DIAGNOSIS — R197 Diarrhea, unspecified: Secondary | ICD-10-CM | POA: Diagnosis not present

## 2015-04-08 ENCOUNTER — Encounter: Payer: Self-pay | Admitting: Gastroenterology

## 2015-04-08 ENCOUNTER — Ambulatory Visit (INDEPENDENT_AMBULATORY_CARE_PROVIDER_SITE_OTHER): Payer: Medicare Other | Admitting: Gastroenterology

## 2015-04-08 VITALS — BP 140/70 | HR 78 | Ht 59.0 in | Wt 129.0 lb

## 2015-04-08 DIAGNOSIS — R1013 Epigastric pain: Secondary | ICD-10-CM | POA: Diagnosis not present

## 2015-04-08 DIAGNOSIS — R197 Diarrhea, unspecified: Secondary | ICD-10-CM

## 2015-04-08 DIAGNOSIS — K8020 Calculus of gallbladder without cholecystitis without obstruction: Secondary | ICD-10-CM | POA: Diagnosis not present

## 2015-04-08 DIAGNOSIS — K558 Other vascular disorders of intestine: Secondary | ICD-10-CM

## 2015-04-08 DIAGNOSIS — G8929 Other chronic pain: Secondary | ICD-10-CM

## 2015-04-08 DIAGNOSIS — D509 Iron deficiency anemia, unspecified: Secondary | ICD-10-CM

## 2015-04-08 DIAGNOSIS — G894 Chronic pain syndrome: Secondary | ICD-10-CM

## 2015-04-08 DIAGNOSIS — K5520 Angiodysplasia of colon without hemorrhage: Secondary | ICD-10-CM

## 2015-04-08 NOTE — Progress Notes (Signed)
[facility] Progress Note  Chief Complaint: Epigastric pain and diarrhea  Subjective History:  10/14/12, Olevia Perches wrote: "This is a 65 year old African American female with severe iron deficiency anemia and Hemoccult-positive stool. Her hemoglobin in June 2014 was 9.5, hematocrit was 33.1 with an MCV of 74. In July 2014, her hemoglobin was 9.7, her hematocrit was 33.7 with an MCV of 78. She was admitted in June 2014 with toxic encephalopathy due to cocaine. She had a full GI workup for heme positive anemia in October 2013. Her upper endoscopy was normal and colonoscopy showed a hyperplastic polyp. She is status post right hemicolectomy with patent anastomosis. She has a history procedure related complcation during 2009colonoscopy and polypectomy leading to perforation . The exam was done because of iron deficiency anemia. She denies any visible blood per rectum. She denies abdominal pain. She has been on aspirin 81 mg daily and Protonix 40 mg daily as well as iron supplements."   11/2012 Capsule : tiny, non-bleeding SB AVMs  The patient now complains of several months of upper abdominal pain and diarrhea. Now c/o few months of upper abd pain and diarrhea. She has rather difficult and somewhat tangential historian. She also has a pronounced stutter, which is making the history challenging. She seems to describe 2 or 3 soft to loose nonbloody BMs per day including nocturnal episodes of incontinence. She failed to mention that she was hospitalized last month for this. During her random surgery recent records, I see that she was evaluated in GI consult by Dr. Benson Norway on 02/20/2015. Dr. Carlean Purl followed up with her on 1/17 and 1/18. Patient was negative for C. difficile and FOBT. A KUB showed moderate stool burden, CT scan was unremarkable. After Carlean Purl felt that the patient had abdominal wall hyperesthesia as part of chronic pain syndrome. She had formed stool on that exam. He notes that the patient was not  documented to have any diarrhea during that hospital stay, but she disagrees. Upper endoscopy was normal. She is not having incidental gallstones on imaging, but that was not felt to be the cause of pain. She does not know which if any of her medicines are new how long she's been on any of them. ROS: Cardiovascular:  no chest pain Respiratory: no dyspnea  Past Medical History: Past Medical History  Diagnosis Date  . History of colonic polyps 2009    adenomatous 2009, HP in 2009, 2011, 2013.   Marland Kitchen GERD (gastroesophageal reflux disease)   . Hyperlipidemia   . Diabetes mellitus without mention of complication   . Hypertension   . Myocardial infarction ([place])   . Coronary artery disease   . Fatty liver disease, nonalcoholic [id number]    on Imaging.   . Cholelithiasis   . Iron deficiency anemia   . Angina   . Asthma   . Arthritis     HANDS"  . Cocaine abuse 07/2012    per E.R. drug screen  . Dysphagia     esophageal dysmotility on 03/2011 esophagram      Objective:  Med list reviewed  Vital signs in last 24 hrs: Filed Vitals:   04/08/15 0946  BP: 140/70  Pulse: 78    Physical Exam  Feeble elderly black woman, gets on the exam table with assistance. She has a marked stutter  HEENT: sclera anicteric, oral mucosa moist without lesions  Neck: supple, no thyromegaly, JVD or lymphadenopathy  Cardiac: RRR without murmurs, S1S2 heard, no peripheral edema  Pulm: clear to auscultation bilaterally,  normal RR and effort noted  Abdomen: soft, very sensitive to light palpation of the upper abdominal wall, with active bowel sounds. No guarding or palpable hepatosplenomegaly, no bruit  Skin; warm and dry, no jaundice or rash  Rectal( chaperoned by MA Vivien Rota); mother decreased sphincter tone, soft brown stool in the rectal vault, or palpable lesions  Recent Labs:  Also on iron tabs for years .  Recent Hgb 13.5 No recent iron level. Ferritin was 20 in 2013.  Imaging and EGD reports as  noted above   @ASSESSMENTPLANBEGIN @ Assessment: Encounter Diagnoses  Name Primary?  . Diarrhea, unspecified type Yes  . Chronic epigastric pain   . Asymptomatic gallstones   . Anemia, iron deficiency   . AVM (arteriovenous malformation) of small bowel, acquired ([place])   . Chronic pain syndrome     Doubt microscopic colitis, even with nocturnal incontinence. She seems to be on several medicines that are unclear indication and benefit in this scenario. I think her abdominal pain is part of a chronic pain syndrome and not related to gallstones Her diarrhea seems most likely medicine side effect I'm not planning a colonoscopy at this point, given her febrile condition and the fact that she had a perforation on a prior colonoscopy. It would seem strange that she develops bile acid diarrhea this many years after her surgery.  Plan: Stop Viberzi Stop Pantoprazole Stop Dicyclomine Stop Metformin  If that does not help in 2 weeks, then stop iron tablets and see your primary doctor to have your iron levels checked.  Take 2 imodium before bed to decrease the chance of diarrhea overnight.  Over half of the 35 minute encounter was spent in counseling and coordination of care.   Topics discussed: Results of all of her hospital testing and discussion of possible medicine side effects as cause for diarrhea  Nelida Meuse III

## 2015-04-08 NOTE — Patient Instructions (Addendum)
Stop Viberzi Stop Pantoprazole Stop Dicyclomine Stop Metformin  If that does not help in 2 weeks, then stop iron tablets and see your primary doctor to have your iron levels checked.  Take 2 imodium before bed to decrease the chance of diarrhea overnight.  Thank you for choosing [facility]  Dr Wilfrid Lund III

## 2015-04-13 IMAGING — CR DG CHEST 2V
2 series · 2 of 2 positions shown · non-contrast
Comparison: 07/07/2012.

CLINICAL DATA: Patient continues to day.

CHEST - 2 VIEW

[w chest lat]
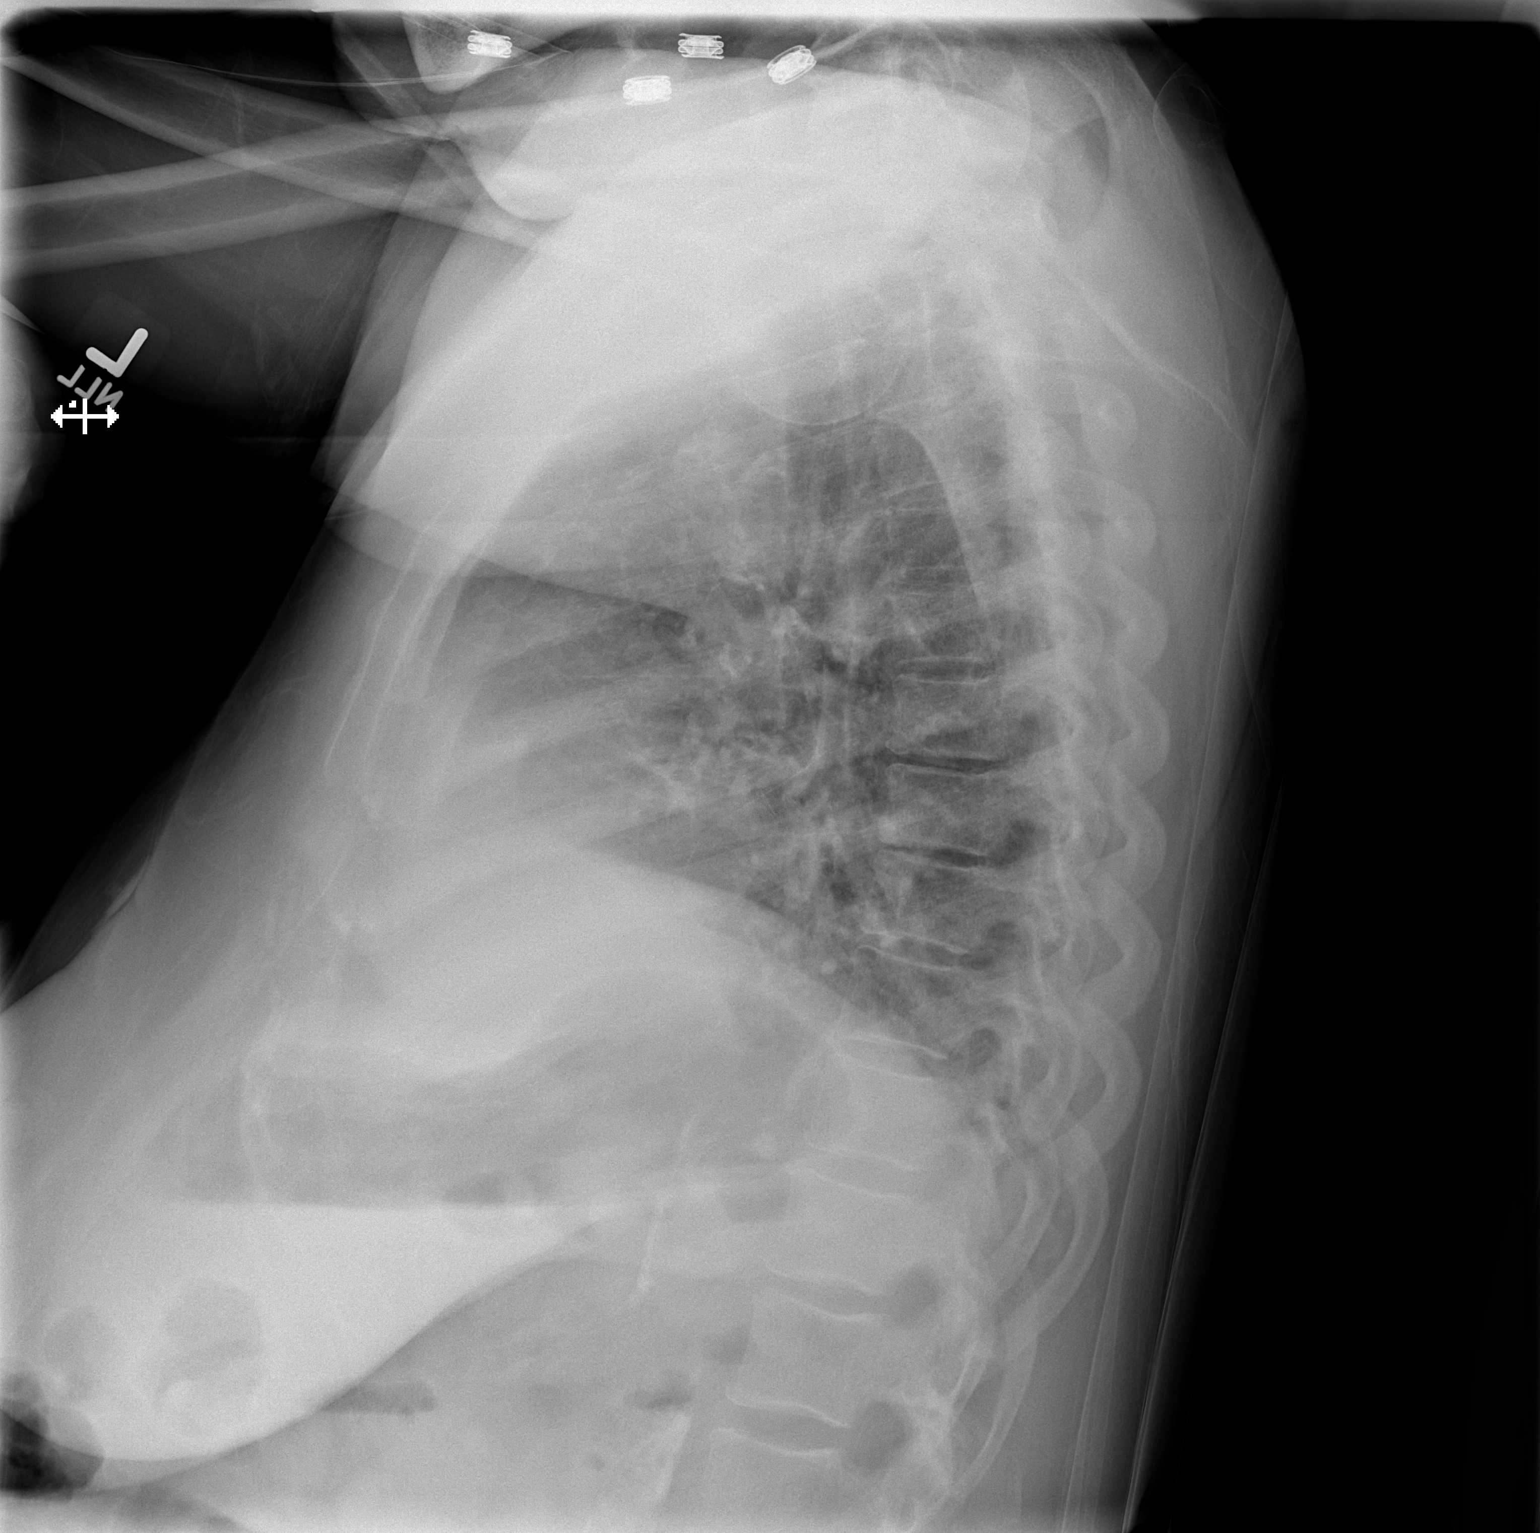

[x chest ap]
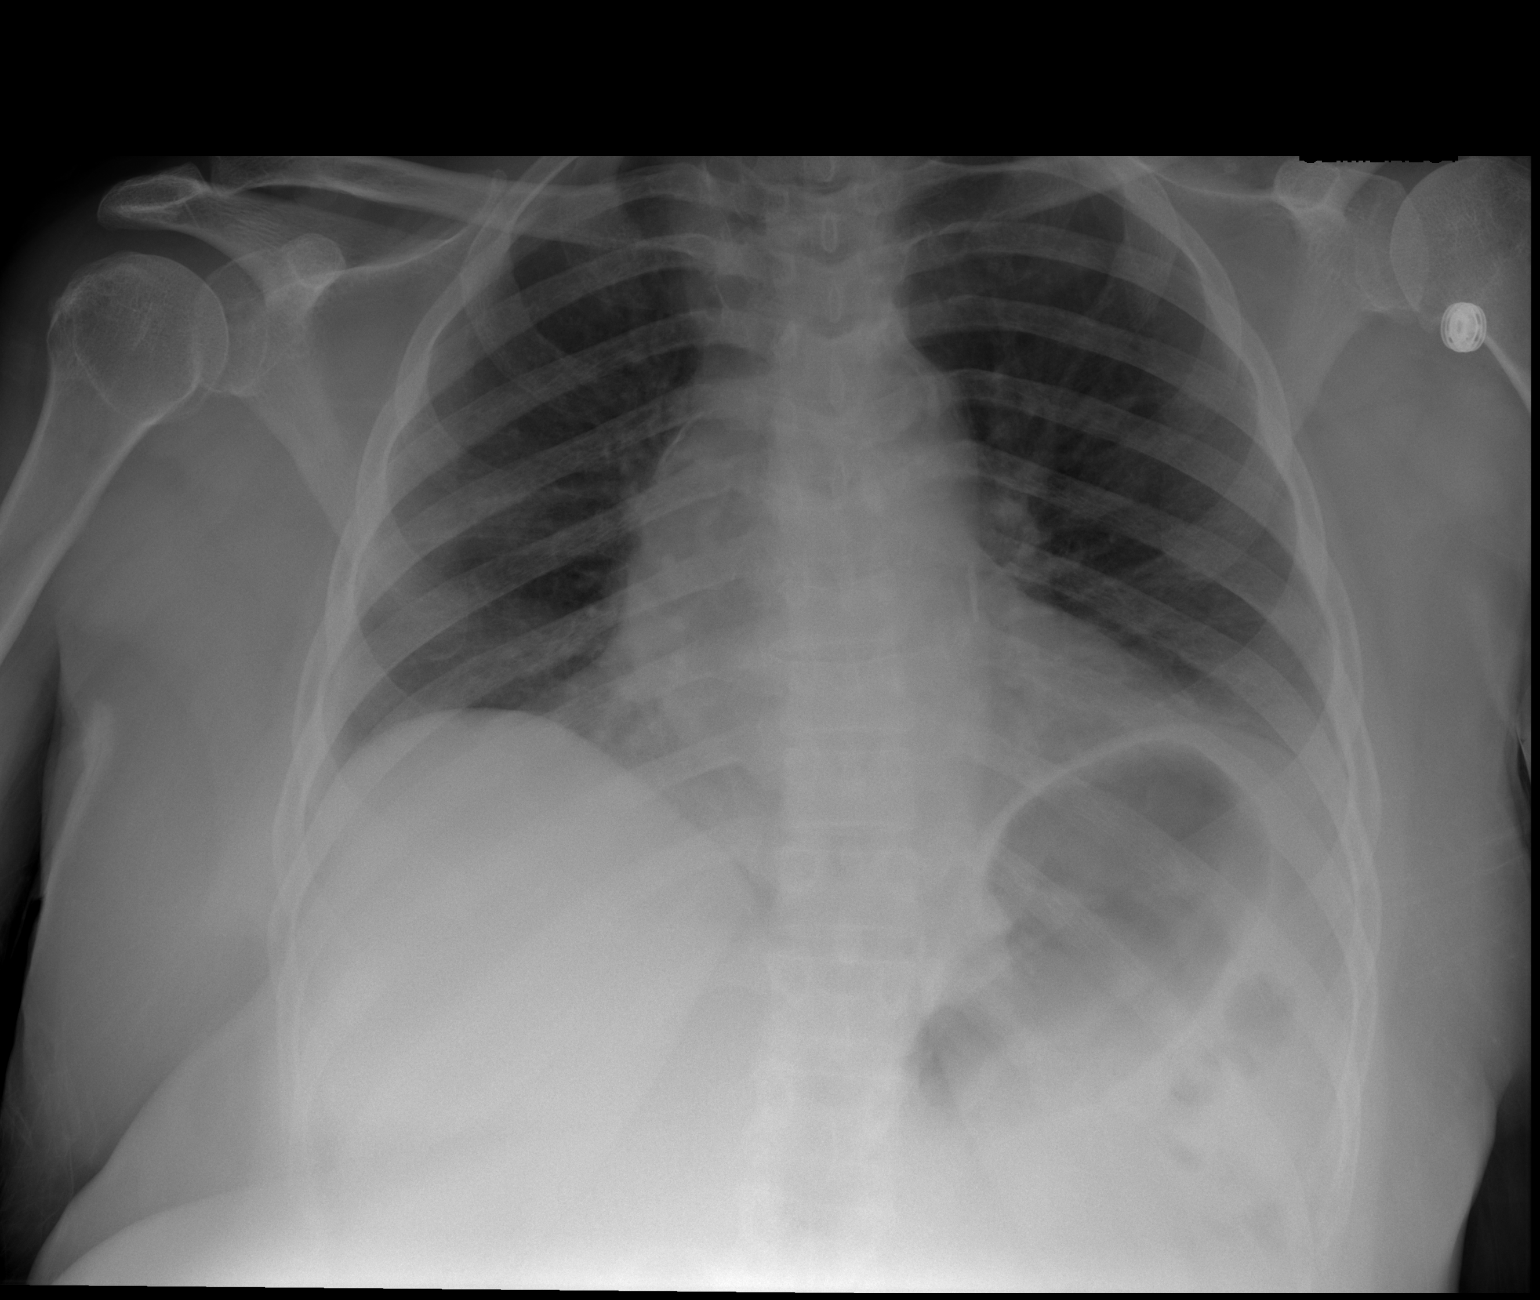

[2 of 2 positions shown; findings below may reference images not displayed]

FINDINGS: The lungs are not well expanded but appear clear.  There
are no effusions or pneumothoraces.  Heart and pulmonary
vasculature are normal.  There are no acute bony changes.
IMPRESSION: No active disease.

## 2015-04-13 IMAGING — CT CT HEAD W/O CM
1 series · 16 of 30 positions shown, 20 images · non-contrast
Comparison: None.

CLINICAL DATA: Severe headache, altered mental status

CT HEAD WITHOUT CONTRAST
TECHNIQUE: Contiguous axial images were obtained from the base of
the skull through the vertex without contrast.

[Series 2: head routine 4.8 h37s · axial · 0.43mm/px · z∈[+1330,+1461]mm · 16 of 30 slices shown, 20 images]
[im 2/30  brain]
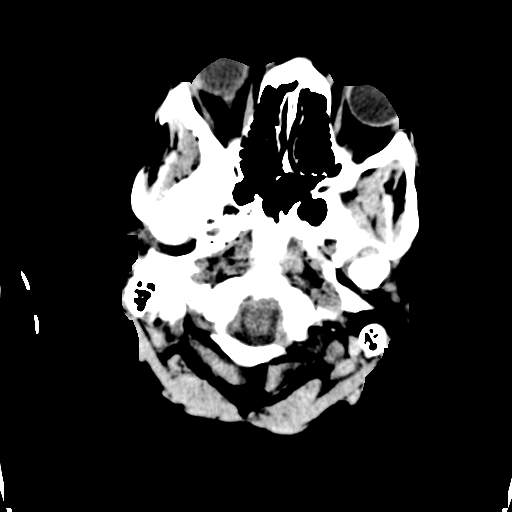
[im 2/30  bone]
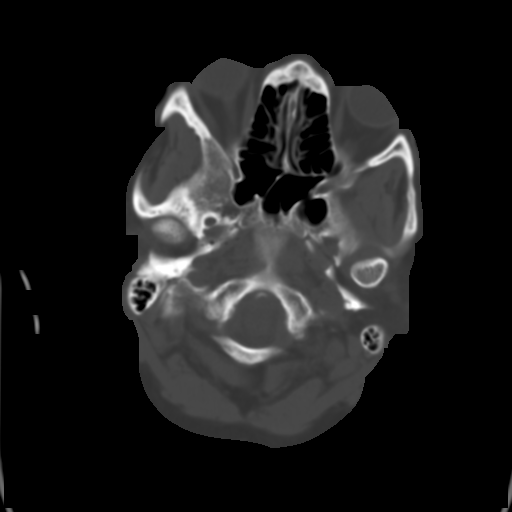
[im 4/30  brain]
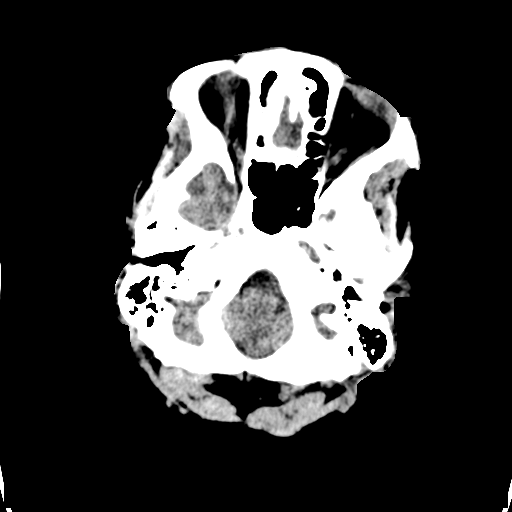
[im 6/30  brain]
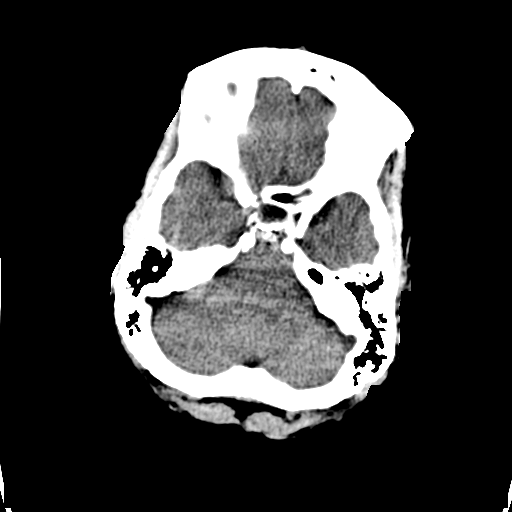
[im 8/30  brain]
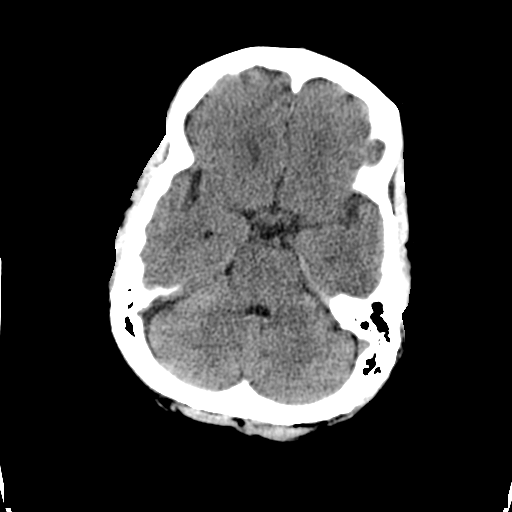
[im 9/30  brain]
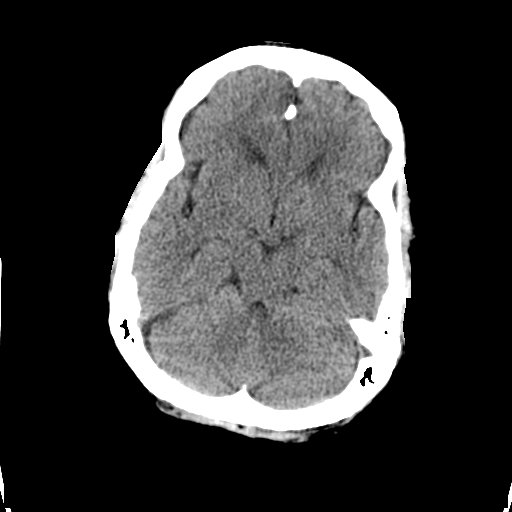
[im 9/30  bone]
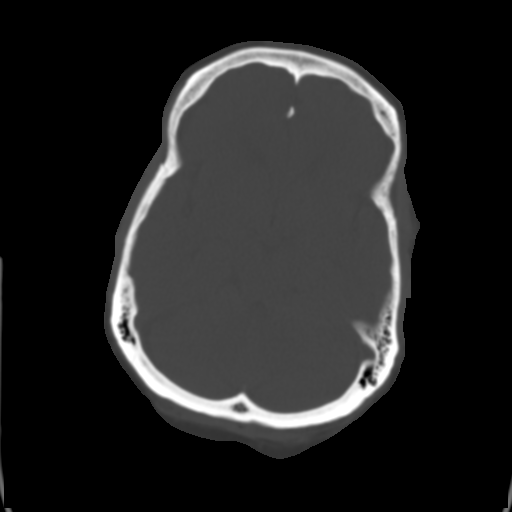
[im 11/30  brain]
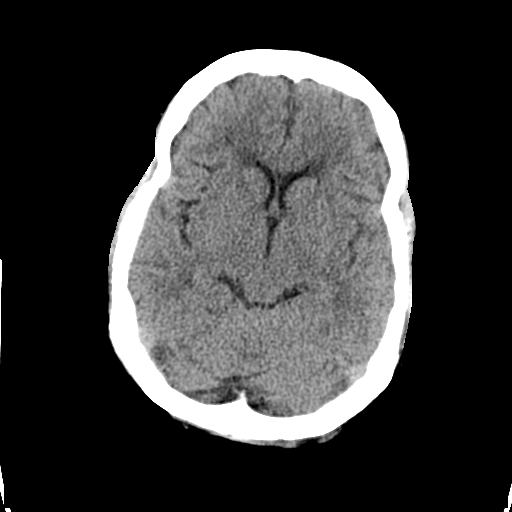
[im 13/30  brain]
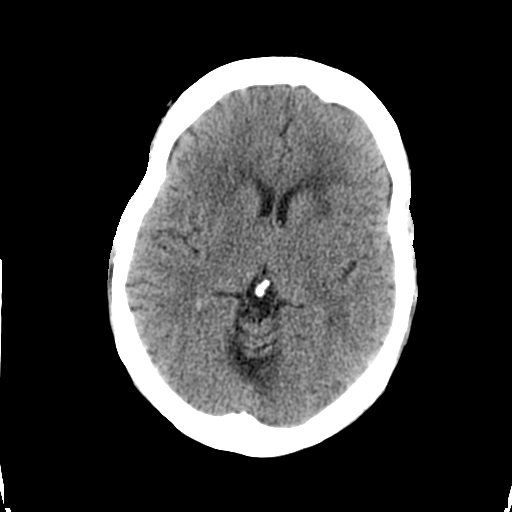
[im 15/30  brain]
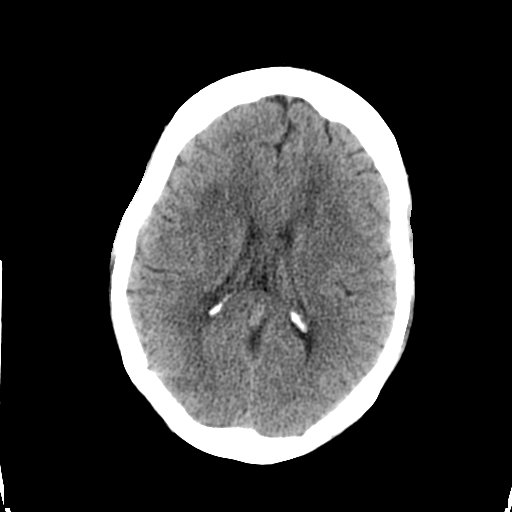
[im 16/30  brain]
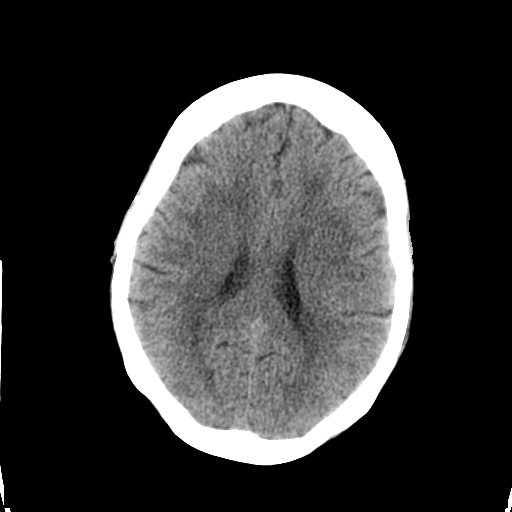
[im 16/30  bone]
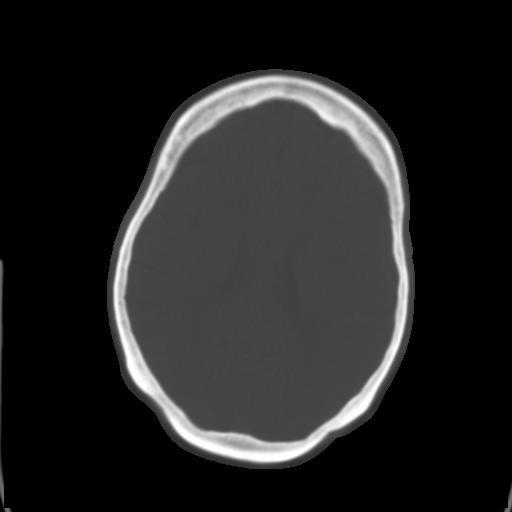
[im 18/30  brain]
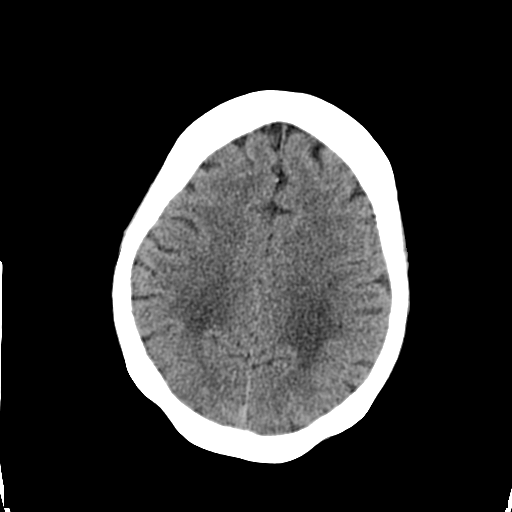
[im 20/30  brain]
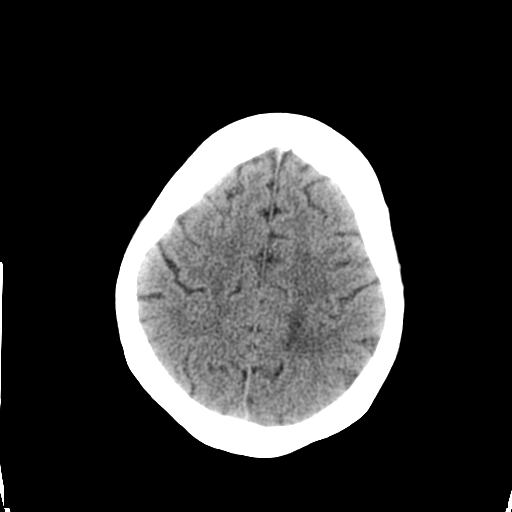
[im 22/30  brain]
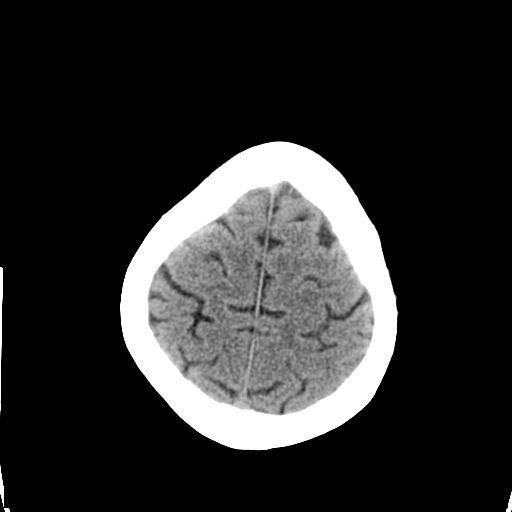
[im 23/30  brain]
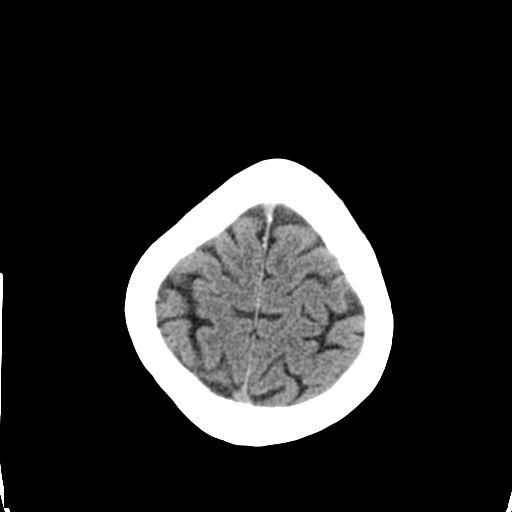
[im 23/30  bone]
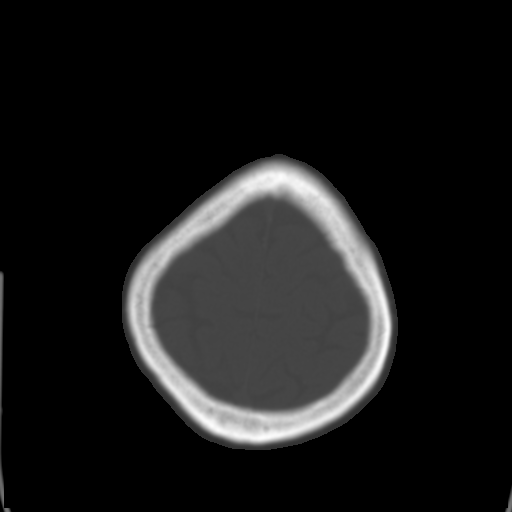
[im 25/30  brain]
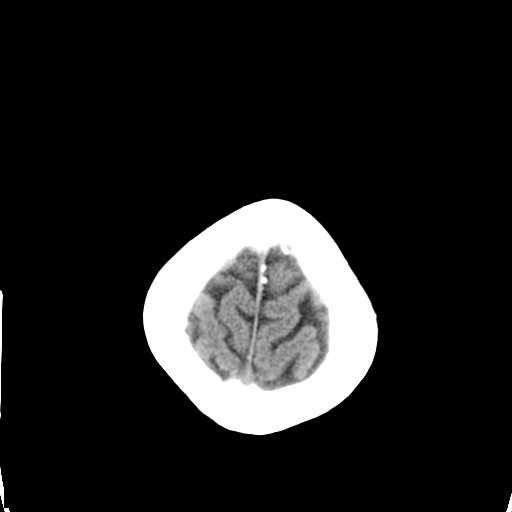
[im 27/30  brain]
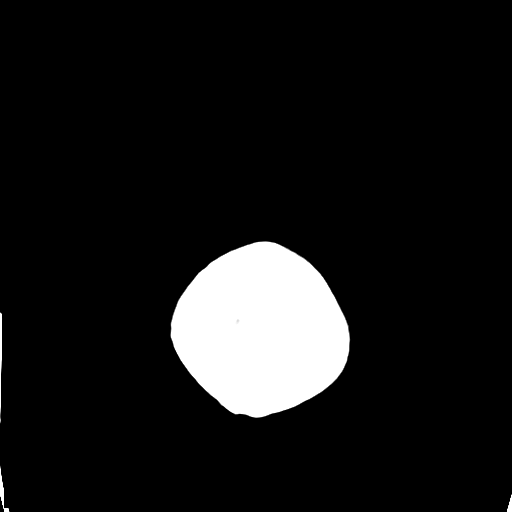
[im 29/30  brain]
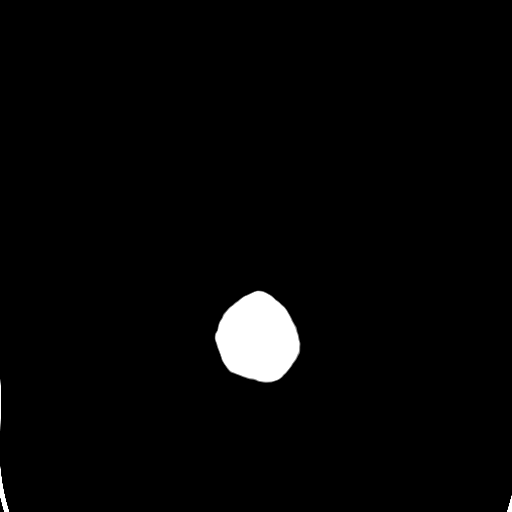

[16 of 30 positions shown; findings below may reference images not displayed]

FINDINGS: There are rather extensive scattered periventricular hypodensities,
left slightly greater than right, compatible with microvascular
ischemic disease.  Given background parenchymal abnormalities,
there is no CT evidence of acute large territory infarct.

No intraparenchymal or extra-axial mass or hemorrhage.  Normal size
and configuration of the ventricles and basilar cisterns.  No
midline shift.  Limited visualization of the paranasal sinuses
demonstrates under pneumatization of the right frontal sinus.  The
remaining paranasal sinuses and mastoid air cells are normally
aerated.  No displaced calvarial fracture.  Regional soft tissues
are normal.
IMPRESSION: Advanced microvascular ischemic disease without acute intracranial
process.

## 2015-04-14 DIAGNOSIS — I25118 Atherosclerotic heart disease of native coronary artery with other forms of angina pectoris: Secondary | ICD-10-CM | POA: Diagnosis not present

## 2015-04-14 DIAGNOSIS — E119 Type 2 diabetes mellitus without complications: Secondary | ICD-10-CM | POA: Diagnosis not present

## 2015-04-14 DIAGNOSIS — E1140 Type 2 diabetes mellitus with diabetic neuropathy, unspecified: Secondary | ICD-10-CM | POA: Diagnosis not present

## 2015-04-14 DIAGNOSIS — R0609 Other forms of dyspnea: Secondary | ICD-10-CM | POA: Diagnosis not present

## 2015-04-14 DIAGNOSIS — I10 Essential (primary) hypertension: Secondary | ICD-10-CM | POA: Diagnosis not present

## 2015-05-31 DIAGNOSIS — J441 Chronic obstructive pulmonary disease with (acute) exacerbation: Secondary | ICD-10-CM | POA: Diagnosis not present

## 2015-05-31 DIAGNOSIS — E1100 Type 2 diabetes mellitus with hyperosmolarity without nonketotic hyperglycemic-hyperosmolar coma (NKHHC): Secondary | ICD-10-CM | POA: Diagnosis not present

## 2015-05-31 DIAGNOSIS — I10 Essential (primary) hypertension: Secondary | ICD-10-CM | POA: Diagnosis not present

## 2015-05-31 DIAGNOSIS — G4700 Insomnia, unspecified: Secondary | ICD-10-CM | POA: Diagnosis not present

## 2015-07-18 DIAGNOSIS — G4730 Sleep apnea, unspecified: Secondary | ICD-10-CM | POA: Diagnosis not present

## 2015-07-24 IMAGING — CT CT HEAD W/O CM
1 series · 16 of 27 positions shown, 20 images · non-contrast
Comparison: 08/02/2012

CLINICAL DATA: Dizziness

EXAM:
CT HEAD WITHOUT CONTRAST
TECHNIQUE: Contiguous axial images were obtained from the base of the skull
through the vertex without intravenous contrast.

[Series 2: head 5.0 h30s · axial · 0.41mm/px · z∈[-57,+63]mm · 16 of 27 slices shown, 20 images]
[im 2/27  brain]
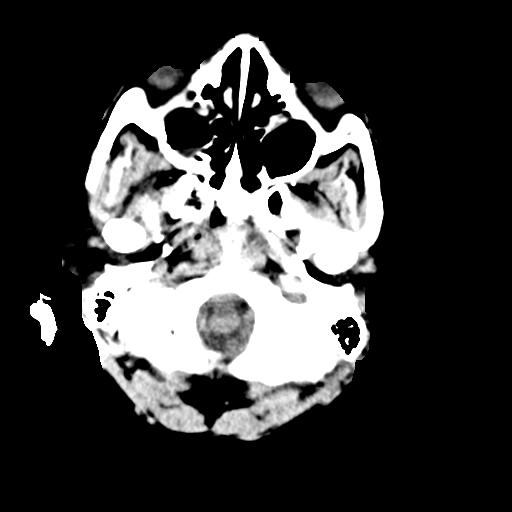
[im 2/27  bone]
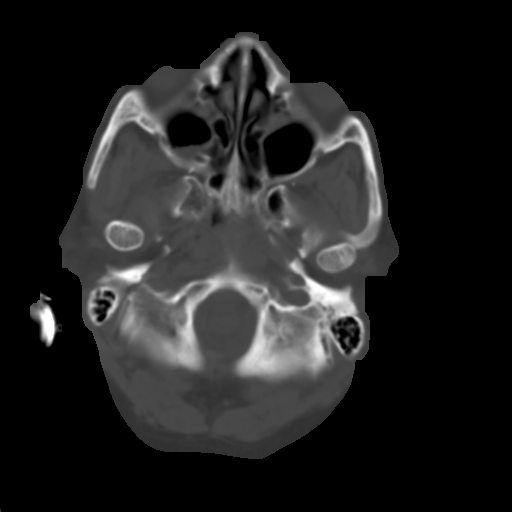
[im 4/27  brain]
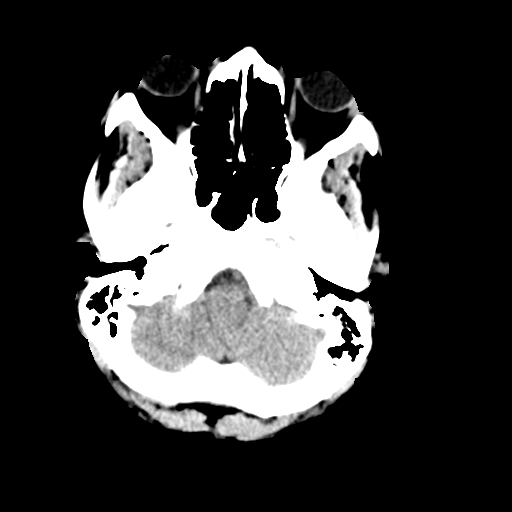
[im 5/27  brain]
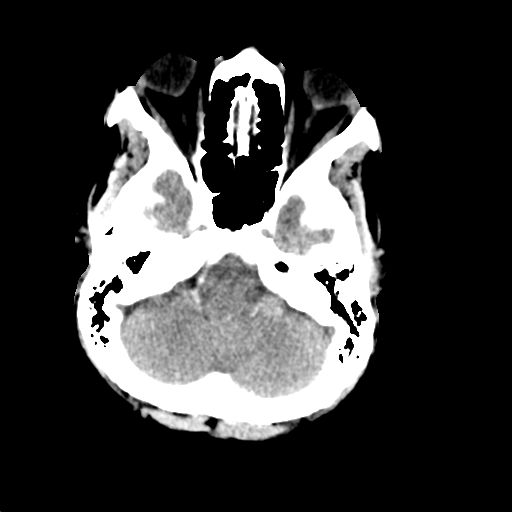
[im 7/27  brain]
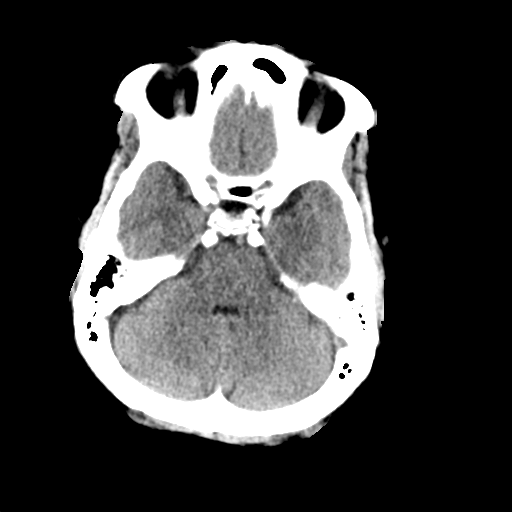
[im 9/27  brain]
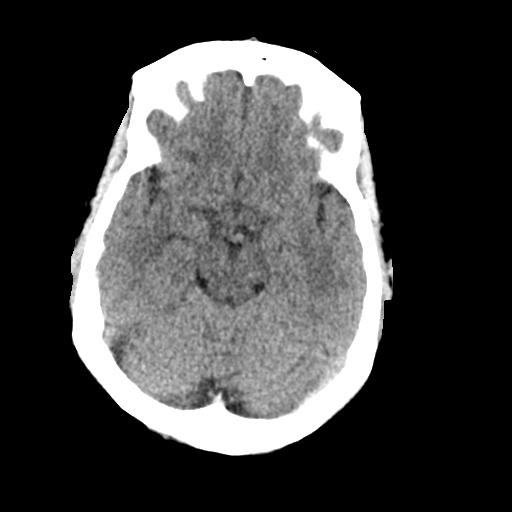
[im 9/27  bone]
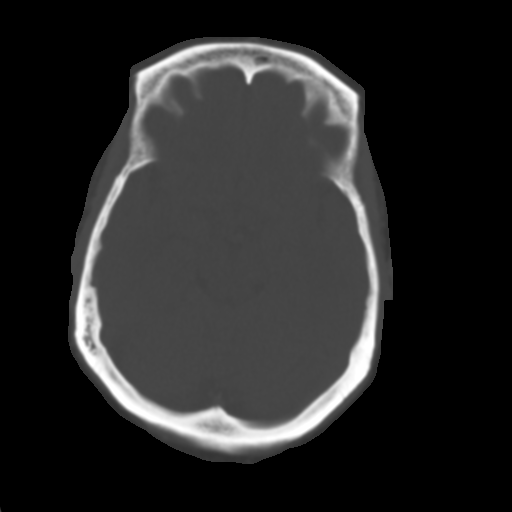
[im 10/27  brain]
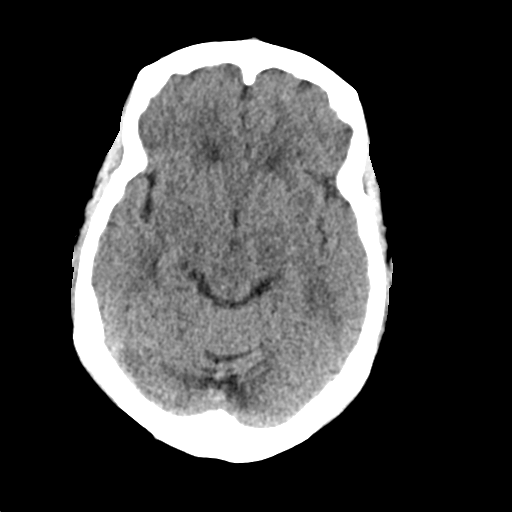
[im 12/27  brain]
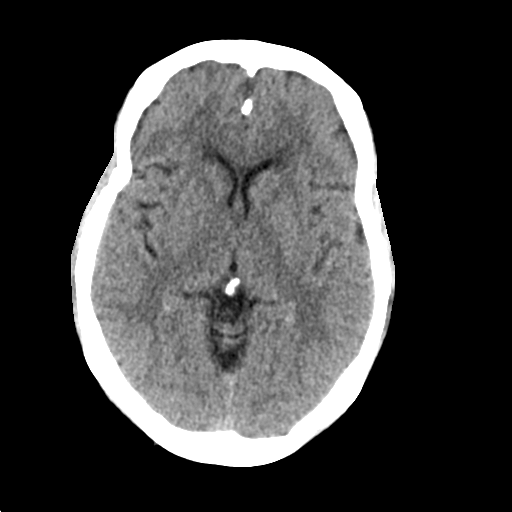
[im 13/27  brain]
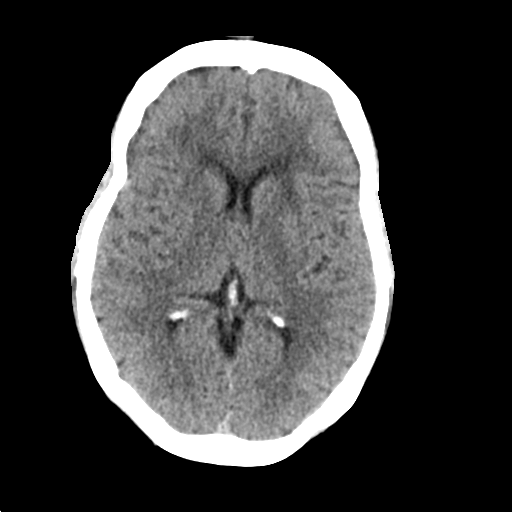
[im 15/27  brain]
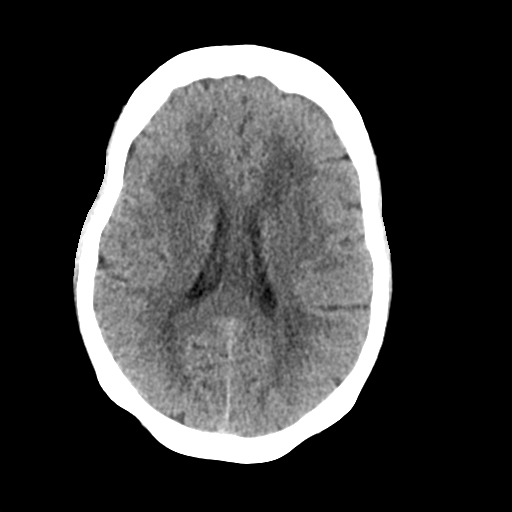
[im 15/27  bone]
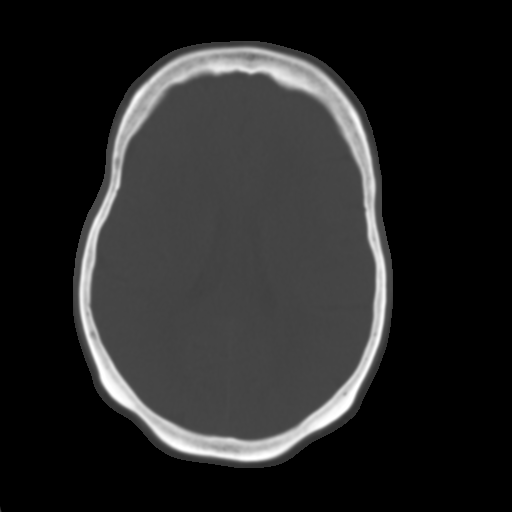
[im 16/27  brain]
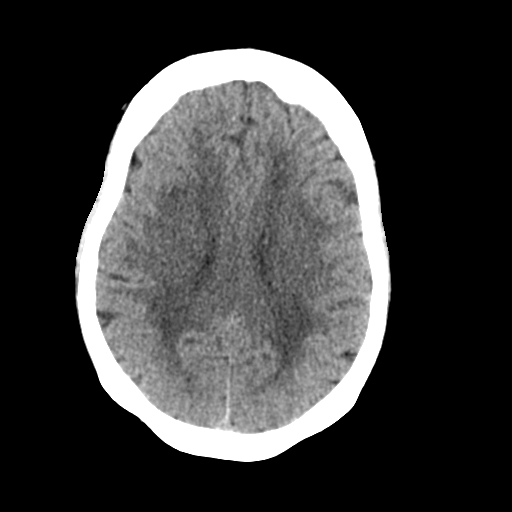
[im 18/27  brain]
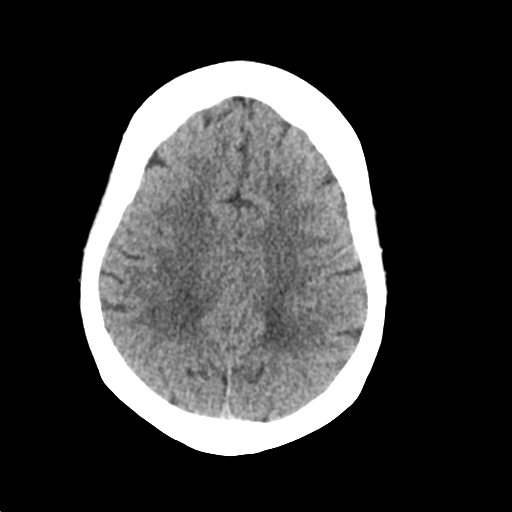
[im 19/27  brain]
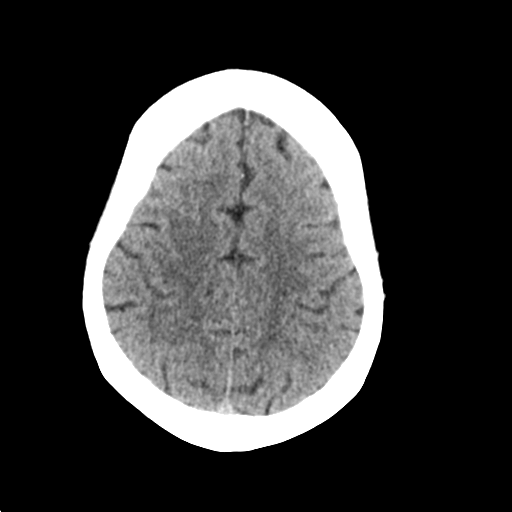
[im 21/27  brain]
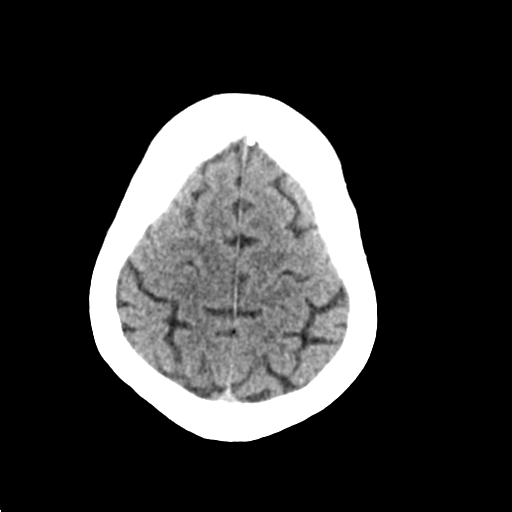
[im 21/27  bone]
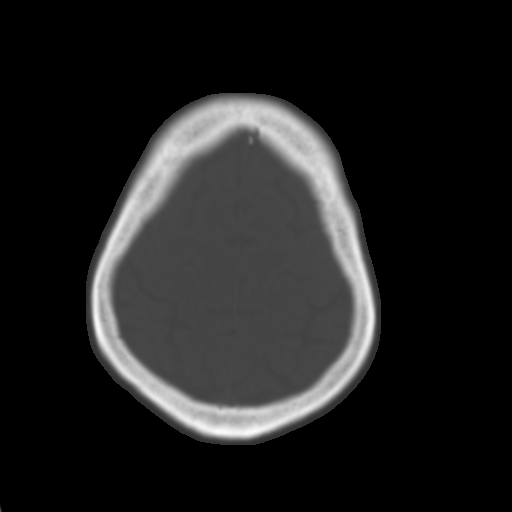
[im 23/27  brain]
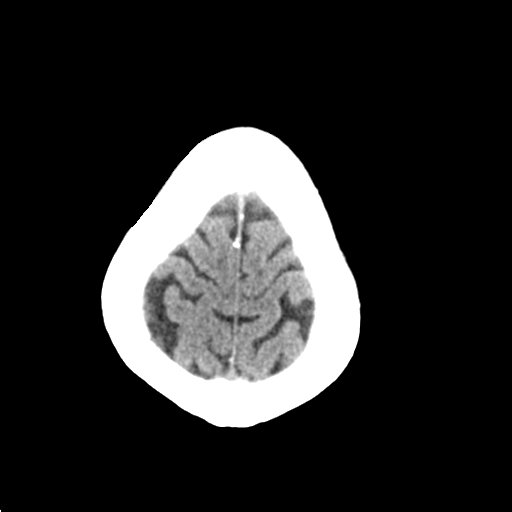
[im 24/27  brain]
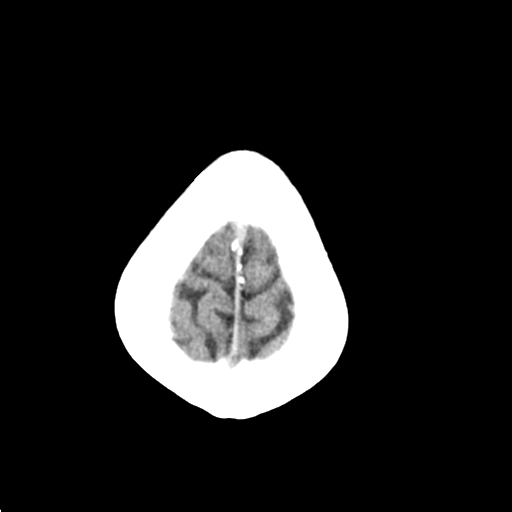
[im 26/27  brain]
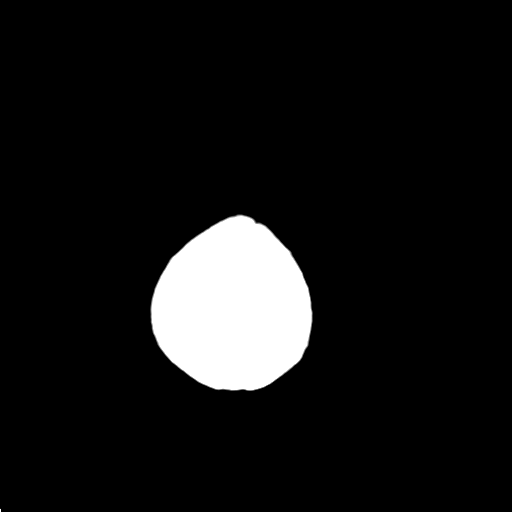

[16 of 27 positions shown; findings below may reference images not displayed]

FINDINGS: There is prominent and confluent periventricular white matter
disease. No mass effect, midline shift, or acute intracranial
hemorrhage. Ventricular system and extra-axial space are within
normal limits.

Mastoid air cells are clear. Air-fluid levels in both maxillary
sinuses. Ethmoid air cells are clear. Frontal sinuses are
hypoplastic and clear. No skull fracture. No destructive bone
lesion.
IMPRESSION: Stable examination with diffuse white matter disease. This likely
represents chronic ischemic changes.

## 2015-08-29 DIAGNOSIS — I10 Essential (primary) hypertension: Secondary | ICD-10-CM | POA: Diagnosis not present

## 2015-08-29 DIAGNOSIS — E119 Type 2 diabetes mellitus without complications: Secondary | ICD-10-CM | POA: Diagnosis not present

## 2015-08-29 DIAGNOSIS — E1100 Type 2 diabetes mellitus with hyperosmolarity without nonketotic hyperglycemic-hyperosmolar coma (NKHHC): Secondary | ICD-10-CM | POA: Diagnosis not present

## 2015-08-29 DIAGNOSIS — J441 Chronic obstructive pulmonary disease with (acute) exacerbation: Secondary | ICD-10-CM | POA: Diagnosis not present

## 2015-09-14 DIAGNOSIS — E119 Type 2 diabetes mellitus without complications: Secondary | ICD-10-CM | POA: Diagnosis not present

## 2015-09-14 DIAGNOSIS — I10 Essential (primary) hypertension: Secondary | ICD-10-CM | POA: Diagnosis not present

## 2015-09-14 DIAGNOSIS — E1140 Type 2 diabetes mellitus with diabetic neuropathy, unspecified: Secondary | ICD-10-CM | POA: Diagnosis not present

## 2015-09-14 DIAGNOSIS — K219 Gastro-esophageal reflux disease without esophagitis: Secondary | ICD-10-CM | POA: Diagnosis not present

## 2015-09-14 DIAGNOSIS — I25118 Atherosclerotic heart disease of native coronary artery with other forms of angina pectoris: Secondary | ICD-10-CM | POA: Diagnosis not present

## 2015-11-27 ENCOUNTER — Emergency Department (HOSPITAL_COMMUNITY)
Admission: EM | Admit: 2015-11-27 | Discharge: 2015-11-27 | Disposition: A | Discharge status: Home / Self Care | Payer: Medicare Other | Attending: Emergency Medicine | Admitting: Emergency Medicine

## 2015-11-27 ENCOUNTER — Encounter (HOSPITAL_COMMUNITY): Payer: Self-pay | Admitting: Emergency Medicine

## 2015-11-27 DIAGNOSIS — Z7984 Long term (current) use of oral hypoglycemic drugs: Secondary | ICD-10-CM | POA: Insufficient documentation

## 2015-11-27 DIAGNOSIS — Z7982 Long term (current) use of aspirin: Secondary | ICD-10-CM | POA: Diagnosis not present

## 2015-11-27 DIAGNOSIS — I10 Essential (primary) hypertension: Secondary | ICD-10-CM | POA: Insufficient documentation

## 2015-11-27 DIAGNOSIS — I252 Old myocardial infarction: Secondary | ICD-10-CM | POA: Diagnosis not present

## 2015-11-27 DIAGNOSIS — J45909 Unspecified asthma, uncomplicated: Secondary | ICD-10-CM | POA: Insufficient documentation

## 2015-11-27 DIAGNOSIS — Z87891 Personal history of nicotine dependence: Secondary | ICD-10-CM | POA: Insufficient documentation

## 2015-11-27 DIAGNOSIS — Z955 Presence of coronary angioplasty implant and graft: Secondary | ICD-10-CM | POA: Insufficient documentation

## 2015-11-27 DIAGNOSIS — F1410 Cocaine abuse, uncomplicated: Secondary | ICD-10-CM | POA: Diagnosis not present

## 2015-11-27 DIAGNOSIS — Z794 Long term (current) use of insulin: Secondary | ICD-10-CM | POA: Insufficient documentation

## 2015-11-27 DIAGNOSIS — I2510 Atherosclerotic heart disease of native coronary artery without angina pectoris: Secondary | ICD-10-CM | POA: Insufficient documentation

## 2015-11-27 DIAGNOSIS — M5489 Other dorsalgia: Secondary | ICD-10-CM | POA: Diagnosis not present

## 2015-11-27 DIAGNOSIS — R109 Unspecified abdominal pain: Secondary | ICD-10-CM | POA: Insufficient documentation

## 2015-11-27 DIAGNOSIS — Z79899 Other long term (current) drug therapy: Secondary | ICD-10-CM | POA: Insufficient documentation

## 2015-11-27 DIAGNOSIS — E119 Type 2 diabetes mellitus without complications: Secondary | ICD-10-CM | POA: Diagnosis not present

## 2015-11-27 LAB — CBC WITH DIFFERENTIAL/PLATELET
BASOS ABS: 0 10*3/uL (ref 0.0–0.1)
Basophils Relative: 0 %
EOS PCT: 1 %
Eosinophils Absolute: 0.1 10*3/uL (ref 0.0–0.7)
HEMATOCRIT: 42.1 % (ref 36.0–46.0)
Hemoglobin: 13.5 g/dL (ref 12.0–15.0)
LYMPHS ABS: 2.2 10*3/uL (ref 0.7–4.0)
LYMPHS PCT: 28 %
MCH: 30.3 pg (ref 26.0–34.0)
MCHC: 32.1 g/dL (ref 30.0–36.0)
MCV: 94.4 fL (ref 78.0–100.0)
MONO ABS: 0.9 10*3/uL (ref 0.1–1.0)
Monocytes Relative: 11 %
NEUTROS ABS: 4.6 10*3/uL (ref 1.7–7.7)
Neutrophils Relative %: 60 %
Platelets: 243 10*3/uL (ref 150–400)
RBC: 4.46 MIL/uL (ref 3.87–5.11)
RDW: 13.8 % (ref 11.5–15.5)
WBC: 7.8 10*3/uL (ref 4.0–10.5)

## 2015-11-27 LAB — URINALYSIS, ROUTINE W REFLEX MICROSCOPIC
BILIRUBIN URINE: NEGATIVE
Glucose, UA: NEGATIVE mg/dL
HGB URINE DIPSTICK: NEGATIVE
Ketones, ur: NEGATIVE mg/dL
NITRITE: NEGATIVE
PROTEIN: NEGATIVE mg/dL
Specific Gravity, Urine: 1.019 (ref 1.005–1.030)
pH: 6 (ref 5.0–8.0)

## 2015-11-27 LAB — COMPREHENSIVE METABOLIC PANEL
ALT: 17 U/L (ref 14–54)
AST: 28 U/L (ref 15–41)
Albumin: 4.2 g/dL (ref 3.5–5.0)
Alkaline Phosphatase: 77 U/L (ref 38–126)
Anion gap: 6 (ref 5–15)
BILIRUBIN TOTAL: 1.4 mg/dL — AB (ref 0.3–1.2)
BUN: 26 mg/dL — AB (ref 6–20)
CO2: 22 mmol/L (ref 22–32)
CREATININE: 1.48 mg/dL — AB (ref 0.44–1.00)
Calcium: 9.2 mg/dL (ref 8.9–10.3)
Chloride: 108 mmol/L (ref 101–111)
GFR, EST AFRICAN AMERICAN: 41 mL/min — AB (ref >60)
GFR, EST NON AFRICAN AMERICAN: 35 mL/min — AB (ref >60)
Glucose, Bld: 83 mg/dL (ref 65–99)
POTASSIUM: 5 mmol/L (ref 3.5–5.1)
Sodium: 136 mmol/L (ref 135–145)
TOTAL PROTEIN: 8.9 g/dL — AB (ref 6.5–8.1)

## 2015-11-27 LAB — RAPID URINE DRUG SCREEN, HOSP PERFORMED
Amphetamines: NOT DETECTED
Barbiturates: NOT DETECTED
Benzodiazepines: POSITIVE — AB
Cocaine: POSITIVE — AB
OPIATES: NOT DETECTED
Tetrahydrocannabinol: NOT DETECTED

## 2015-11-27 LAB — URINE MICROSCOPIC-ADD ON: RBC / HPF: NONE SEEN RBC/hpf (ref 0–5)

## 2015-11-27 MED ORDER — FENTANYL CITRATE (PF) 100 MCG/2ML IJ SOLN
25.0000 ug | Freq: Once | INTRAMUSCULAR | Status: AC
Start: 2015-11-27 — End: 2015-11-27
  Administered 2015-11-27: 25 ug via INTRAVENOUS
  Filled 2015-11-27: qty 2

## 2015-11-27 MED ORDER — ONDANSETRON HCL 4 MG/2ML IJ SOLN
4.0000 mg | Freq: Once | INTRAMUSCULAR | Status: AC
  Administered 2015-11-27: 4 mg via INTRAVENOUS
  Filled 2015-11-27: qty 2

## 2015-11-27 MED ORDER — HYDROCODONE-ACETAMINOPHEN 5-325 MG PO TABS
2.0000 | ORAL_TABLET | Freq: Once | ORAL | Status: AC
Start: 2015-11-27 — End: 2015-11-27
  Administered 2015-11-27: 2 via ORAL
  Filled 2015-11-27: qty 2

## 2015-11-27 MED ORDER — IBUPROFEN 600 MG PO TABS: 600.0000 mg | ORAL_TABLET | Freq: Four times a day (QID) | ORAL | 0 refills | Status: DC | PRN

## 2015-11-27 MED ORDER — CYCLOBENZAPRINE HCL 10 MG PO TABS: 10.0000 mg | ORAL_TABLET | Freq: Two times a day (BID) | ORAL | 0 refills | Status: DC | PRN

## 2015-11-27 NOTE — ED Notes (Signed)
Patient is A & O x4.  She understood discharge instructions. 

## 2015-11-27 NOTE — ED Notes (Signed)
Bed: BJ47 Expected date:  Expected time:  Means of arrival:  Comments: 65yo F back pain

## 2015-11-27 NOTE — ED Triage Notes (Signed)
Patient reports right sided flank pain for 3 days.  She was hanging curtains.  Pain does not radiate.  No falls, LOC, or trauma.  BP:128/88 P:60 R:20   CBG:89

## 2015-11-27 NOTE — ED Notes (Signed)
Patient sat on bed pan for 25 minutes and was unable to void. Says she is "ok with a catheter" if we need urine right away.

## 2015-11-27 NOTE — ED Provider Notes (Signed)
[facility] DEPT Provider Note   CSN: [id number] Arrival date & time: 11/27/15  3646     History   Chief Complaint Chief Complaint  Patient presents with  . Flank Pain    HPI [name] is a 65 y.o. female.  HPI Patient reports right flank and back pain for the last 3 days.  She thinks it's pulled muscle from where she was having curtains.  Patient denies fever or chills.  Has no previous history of kidney stones.  Does have history of cocaine abuse.  Denies falls or trauma to that right flank area. Past Medical History:  Diagnosis Date  . Angina   . Arthritis    HANDS"  . Asthma   . Cholelithiasis   . Cocaine abuse 07/2012   per E.R. drug screen  . Coronary artery disease   . Diabetes mellitus without mention of complication   . Dysphagia    esophageal dysmotility on 03/2011 esophagram   . Fatty liver disease, nonalcoholic 8032   on Imaging.   Marland Kitchen GERD (gastroesophageal reflux disease)   . History of colonic polyps 2009   adenomatous 2009, HP in 2009, 2011, 2013.   Marland Kitchen Hyperlipidemia   . Hypertension   . Iron deficiency anemia   . Myocardial infarction     Patient Active Problem List   Diagnosis Date Noted  . Abdominal pain, epigastric   . Acute on chronic renal failure ([place]) 02/19/2015  . Diarrhea   . Nausea with vomiting   . Hyperglycemia 04/01/2014  . Hyperosmolar non-ketotic state in patient with type 2 diabetes mellitus ([place]) 04/01/2014  . Nausea vomiting and diarrhea 04/01/2014  . CAD (coronary artery disease) 04/01/2014  . Dehydration   . Diabetic ketoacidosis without coma associated with diabetes mellitus due to underlying condition ([place])   . High anion gap metabolic acidosis   . Cocaine abuse   . Toxic encephalopathy-Unlikely metabolic,?psychogenic 08/02/2012  . Bereavement 02/02/2012  . Depression 02/02/2012  . Iron deficiency anemia, unspecified 11/30/2011  . Nonspecific abnormal finding in stool contents 11/30/2011  . Benign neoplasm of  colon 11/30/2011  . Cough 05/14/2011  . Chest pain 03/19/2011  . Anemia 03/19/2011  . Diabetes mellitus ([place]) 03/19/2011  . DOE (dyspnea on exertion) 03/19/2011  . Chest pain at rest 02/18/2011    Class: Acute  . Cholelithiasis 11/01/2010  . DM 04/08/2009  . HYPERLIPIDEMIA 04/08/2009  . Essential hypertension 04/08/2009  . MYOCARDIAL INFARCTION 04/08/2009  . Coronary atherosclerosis 04/08/2009  . GERD 04/08/2009  . FATTY LIVER DISEASE 04/08/2009  . COLONIC POLYPS, HX OF 04/08/2009    Past Surgical History:  Procedure Laterality Date  . ABDOMINAL ANGIOGRAM  02/20/2011   Procedure: ABDOMINAL ANGIOGRAM;  Surgeon: Clent Demark, MD;  Location: [facility] CATH LAB;  Service: Cardiovascular;;  . COLONOSCOPY  11/30/2011   Procedure: COLONOSCOPY;  Surgeon: Lafayette Dragon, MD;  Location: [facility] ENDOSCOPY;  Service: Endoscopy;  Laterality: N/A;  . CORONARY ANGIOPLASTY WITH STENT PLACEMENT    . ESOPHAGOGASTRODUODENOSCOPY  11/30/2011   Procedure: ESOPHAGOGASTRODUODENOSCOPY (EGD);  Surgeon: Lafayette Dragon, MD;  Location: [facility] ENDOSCOPY;  Service: Endoscopy;  Laterality: N/A;  . ESOPHAGOGASTRODUODENOSCOPY N/A 02/23/2015   Procedure: ESOPHAGOGASTRODUODENOSCOPY (EGD);  Surgeon: Gatha Mayer, MD;  Location: [facility] ENDOSCOPY;  Service: Endoscopy;  Laterality: N/A;  . LEFT HEART CATHETERIZATION WITH CORONARY ANGIOGRAM N/A 02/20/2011   Procedure: LEFT HEART CATHETERIZATION WITH CORONARY ANGIOGRAM;  Surgeon: Clent Demark, MD;  Location: [facility] CATH LAB;  Service: Cardiovascular;  Laterality: N/A;  .  RIGHT COLECTOMY  02/2007   for post polypectomy colonic perforation  . TUBAL LIGATION      OB History    No data available       Home Medications    Prior to Admission medications   Medication Sig Start Date End Date Taking? Authorizing Provider  albuterol (PROVENTIL) (2.5 MG/3ML) 0.083% nebulizer solution Take 2.5 mg by nebulization 2 (two) times daily.   Yes Historical Provider, MD  ALPRAZolam (XANAX) 0.25 MG  tablet Take 0.25 mg by mouth 2 (two) times daily.    Yes Historical Provider, MD  amLODipine (NORVASC) 5 MG tablet Take 5 mg by mouth daily.   Yes Historical Provider, MD  aspirin EC 81 MG EC tablet Take 1 tablet (81 mg total) by mouth daily. 02/06/12  Yes Patrecia Pour, NP  atorvastatin (LIPITOR) 20 MG tablet Take 20 mg by mouth daily.   Yes Historical Provider, MD  budesonide-formoterol (SYMBICORT) 160-4.5 MCG/ACT inhaler Inhale 2 puffs into the lungs 2 (two) times daily.   Yes Historical Provider, MD  dicyclomine (BENTYL) 20 MG tablet Take 1 tablet (20 mg total) by mouth 2 (two) times daily. 02/27/15  Yes Marella Chimes, PA-C  feeding supplement, ENSURE ENLIVE, (ENSURE ENLIVE) LIQD Take 237 mLs by mouth 2 (two) times daily between meals. 02/24/15  Yes Shanker Kristeen Mans, MD  Insulin Glargine (LANTUS SOLOSTAR) 100 UNIT/ML Solostar Pen Inject 20 Units into the skin daily at 10 pm. Patient taking differently: Inject 40 Units into the skin 2 (two) times daily.  02/24/15  Yes Shanker Kristeen Mans, MD  metFORMIN (GLUCOPHAGE) 1000 MG tablet Take 1,000 mg by mouth 2 (two) times daily with a meal.   Yes Historical Provider, MD  metoprolol succinate (TOPROL-XL) 50 MG 24 hr tablet Take 50 mg by mouth every evening. Take with or immediately following a meal.   Yes Historical Provider, MD  nitroGLYCERIN (NITROSTAT) 0.4 MG SL tablet Place 1 tablet (0.4 mg total) under the tongue every 5 (five) minutes as needed. For chest pain Patient taking differently: Place 0.4 mg under the tongue every 5 (five) minutes as needed for chest pain.  02/06/12  Yes Patrecia Pour, NP  pantoprazole (PROTONIX) 40 MG tablet Take 1 tablet (40 mg total) by mouth daily. 02/24/15  Yes Shanker Kristeen Mans, MD  cyclobenzaprine (FLEXERIL) 10 MG tablet Take 1 tablet (10 mg total) by mouth 2 (two) times daily as needed for muscle spasms. 11/27/15   Leonard Schwartz, MD  DULoxetine (CYMBALTA) 30 MG capsule Take 1 capsule (30 mg total) by mouth  daily. Patient not taking: Reported on 11/27/2015 02/24/15   Jonetta Osgood, MD  loperamide (IMODIUM) 2 MG capsule Take 1 capsule (2 mg total) by mouth 4 (four) times daily as needed for diarrhea or loose stools. Patient not taking: Reported on 11/27/2015 02/27/15   Marella Chimes, PA-C  sucralfate (CARAFATE) 1 GM/10ML suspension Take 10 mLs (1 g total) by mouth 4 (four) times daily -  with meals and at bedtime. 02/24/15   Shanker Kristeen Mans, MD    Family History Family History  Problem Relation Age of Onset  . Heart disease Mother   . Diabetes Mother   . Cancer Father     unsure what kind  . Diabetes Sister   . Colon cancer Neg Hx     Social History Social History  Substance Use Topics  . Smoking status: Former Smoker    Packs/day: 0.50    Years: 47.00  Types: Cigarettes    Quit date: 05/09/2010  . Smokeless tobacco: Never Used  . Alcohol use No     Allergies   Review of patient's allergies indicates no known allergies.   Review of Systems Review of Systems  All other systems reviewed and are negative Physical Exam Updated Vital Signs BP 110/56 (BP Location: Left Arm)   Pulse 70   Temp 97.6 F (36.4 C) (Oral)   Resp 18   Ht 4\' 11"  (1.499 m)   Wt 135 lb (61.2 kg)   SpO2 95%   BMI 27.27 kg/m   Physical Exam  Constitutional: She is oriented to person, place, and time. She appears well-developed and well-nourished. No distress.  HENT:  Head: Normocephalic and atraumatic.  Eyes: Pupils are equal, round, and reactive to light.  Neck: Normal range of motion.  Cardiovascular: Normal rate and intact distal pulses.   Pulmonary/Chest: Effort normal and breath sounds normal. No respiratory distress.  Abdominal: Soft. Normal appearance and bowel sounds are normal. She exhibits no distension. There is no tenderness. There is no guarding.  Musculoskeletal: Normal range of motion.       Back:  Patient tender in area as indicated.  Pain seems to increase with  palpation.  Her sons are equal bilaterally.  Neurological: She is alert and oriented to person, place, and time. No cranial nerve deficit.  Skin: Skin is warm and dry. No rash noted.  Psychiatric: She has a normal mood and affect. Her behavior is normal.  Nursing note and vitals reviewed.    ED Treatments / Results  Labs (all labs ordered are listed, but only abnormal results are displayed) Labs Reviewed  COMPREHENSIVE METABOLIC PANEL - Abnormal; Notable for the following:       Result Value   BUN 26 (*)    Creatinine, Ser 1.48 (*)    Total Protein 8.9 (*)    Total Bilirubin 1.4 (*)    GFR calc non Af Amer 35 (*)    GFR calc Af Amer 41 (*)    All other components within normal limits  URINALYSIS, ROUTINE W REFLEX MICROSCOPIC (NOT AT [facility]) - Abnormal; Notable for the following:    APPearance CLOUDY (*)    Leukocytes, UA MODERATE (*)    All other components within normal limits  RAPID URINE DRUG SCREEN, HOSP PERFORMED - Abnormal; Notable for the following:    Cocaine POSITIVE (*)    Benzodiazepines POSITIVE (*)    All other components within normal limits  URINE MICROSCOPIC-ADD ON - Abnormal; Notable for the following:    Squamous Epithelial / LPF 6-30 (*)    Bacteria, UA FEW (*)    All other components within normal limits  CBC WITH DIFFERENTIAL/PLATELET    EKG  EKG Interpretation None       Radiology No results found.  Procedures Procedures (including critical care time)  Medications Ordered in ED Medications  fentaNYL (SUBLIMAZE) injection 25 mcg (25 mcg Intravenous Given 11/27/15 0808)  ondansetron (ZOFRAN) injection 4 mg (4 mg Intravenous Given 11/27/15 0808)     Initial Impression / Assessment and Plan / ED Course  I have reviewed the triage vital signs and the nursing notes.  Pertinent labs & imaging results that were available during my care of the patient were reviewed by me and considered in my medical decision making (see chart for  details).  Clinical Course      Final Clinical Impressions(s) / ED Diagnoses   Final diagnoses:  Flank  pain  Cocaine abuse    New Prescriptions New Prescriptions   CYCLOBENZAPRINE (FLEXERIL) 10 MG TABLET    Take 1 tablet (10 mg total) by mouth 2 (two) times daily as needed for muscle spasms.     Leonard Schwartz, MD 11/27/15 [phone]

## 2015-12-27 DIAGNOSIS — J441 Chronic obstructive pulmonary disease with (acute) exacerbation: Secondary | ICD-10-CM | POA: Diagnosis not present

## 2015-12-27 DIAGNOSIS — Z79899 Other long term (current) drug therapy: Secondary | ICD-10-CM | POA: Diagnosis not present

## 2015-12-27 DIAGNOSIS — Z23 Encounter for immunization: Secondary | ICD-10-CM | POA: Diagnosis not present

## 2015-12-27 DIAGNOSIS — E119 Type 2 diabetes mellitus without complications: Secondary | ICD-10-CM | POA: Diagnosis not present

## 2015-12-27 DIAGNOSIS — I10 Essential (primary) hypertension: Secondary | ICD-10-CM | POA: Diagnosis not present

## 2016-01-02 DIAGNOSIS — E1140 Type 2 diabetes mellitus with diabetic neuropathy, unspecified: Secondary | ICD-10-CM | POA: Diagnosis not present

## 2016-01-02 DIAGNOSIS — I25118 Atherosclerotic heart disease of native coronary artery with other forms of angina pectoris: Secondary | ICD-10-CM | POA: Diagnosis not present

## 2016-01-02 DIAGNOSIS — I10 Essential (primary) hypertension: Secondary | ICD-10-CM | POA: Diagnosis not present

## 2016-01-02 DIAGNOSIS — E119 Type 2 diabetes mellitus without complications: Secondary | ICD-10-CM | POA: Diagnosis not present

## 2016-01-02 DIAGNOSIS — E785 Hyperlipidemia, unspecified: Secondary | ICD-10-CM | POA: Diagnosis not present

## 2016-01-27 DIAGNOSIS — J441 Chronic obstructive pulmonary disease with (acute) exacerbation: Secondary | ICD-10-CM | POA: Diagnosis not present

## 2016-01-27 DIAGNOSIS — E119 Type 2 diabetes mellitus without complications: Secondary | ICD-10-CM | POA: Diagnosis not present

## 2016-01-27 DIAGNOSIS — M545 Low back pain: Secondary | ICD-10-CM | POA: Diagnosis not present

## 2016-01-27 DIAGNOSIS — I10 Essential (primary) hypertension: Secondary | ICD-10-CM | POA: Diagnosis not present

## 2016-02-24 DIAGNOSIS — R262 Difficulty in walking, not elsewhere classified: Secondary | ICD-10-CM | POA: Diagnosis not present

## 2016-02-24 DIAGNOSIS — R296 Repeated falls: Secondary | ICD-10-CM | POA: Diagnosis not present

## 2016-02-24 DIAGNOSIS — M6281 Muscle weakness (generalized): Secondary | ICD-10-CM | POA: Diagnosis not present

## 2016-03-09 DIAGNOSIS — I10 Essential (primary) hypertension: Secondary | ICD-10-CM | POA: Diagnosis not present

## 2016-03-09 DIAGNOSIS — J399 Disease of upper respiratory tract, unspecified: Secondary | ICD-10-CM | POA: Diagnosis not present

## 2016-03-09 DIAGNOSIS — J441 Chronic obstructive pulmonary disease with (acute) exacerbation: Secondary | ICD-10-CM | POA: Diagnosis not present

## 2016-03-26 DIAGNOSIS — E1100 Type 2 diabetes mellitus with hyperosmolarity without nonketotic hyperglycemic-hyperosmolar coma (NKHHC): Secondary | ICD-10-CM | POA: Diagnosis not present

## 2016-03-26 DIAGNOSIS — J441 Chronic obstructive pulmonary disease with (acute) exacerbation: Secondary | ICD-10-CM | POA: Diagnosis not present

## 2016-04-02 DIAGNOSIS — I10 Essential (primary) hypertension: Secondary | ICD-10-CM | POA: Diagnosis not present

## 2016-04-02 DIAGNOSIS — I25110 Atherosclerotic heart disease of native coronary artery with unstable angina pectoris: Secondary | ICD-10-CM | POA: Diagnosis not present

## 2016-04-02 DIAGNOSIS — E119 Type 2 diabetes mellitus without complications: Secondary | ICD-10-CM | POA: Diagnosis not present

## 2016-04-02 DIAGNOSIS — E1140 Type 2 diabetes mellitus with diabetic neuropathy, unspecified: Secondary | ICD-10-CM | POA: Diagnosis not present

## 2016-04-02 DIAGNOSIS — E785 Hyperlipidemia, unspecified: Secondary | ICD-10-CM | POA: Diagnosis not present

## 2016-04-03 ENCOUNTER — Other Ambulatory Visit: Payer: Self-pay | Admitting: Cardiology

## 2016-04-03 DIAGNOSIS — R079 Chest pain, unspecified: Secondary | ICD-10-CM

## 2016-04-16 ENCOUNTER — Encounter (HOSPITAL_COMMUNITY): Payer: Medicare Other

## 2016-04-20 ENCOUNTER — Encounter (HOSPITAL_COMMUNITY)
Admission: RE | Admit: 2016-04-20 | Discharge: 2016-04-20 | Disposition: A | Discharge status: Home / Self Care | Payer: Medicare Other | Source: Ambulatory Visit | Attending: Cardiology | Admitting: Cardiology

## 2016-04-20 DIAGNOSIS — E1140 Type 2 diabetes mellitus with diabetic neuropathy, unspecified: Secondary | ICD-10-CM | POA: Diagnosis not present

## 2016-04-20 DIAGNOSIS — R079 Chest pain, unspecified: Secondary | ICD-10-CM | POA: Insufficient documentation

## 2016-04-20 DIAGNOSIS — R9431 Abnormal electrocardiogram [ECG] [EKG]: Secondary | ICD-10-CM | POA: Diagnosis not present

## 2016-04-20 DIAGNOSIS — I25110 Atherosclerotic heart disease of native coronary artery with unstable angina pectoris: Secondary | ICD-10-CM | POA: Diagnosis not present

## 2016-04-20 DIAGNOSIS — I10 Essential (primary) hypertension: Secondary | ICD-10-CM | POA: Diagnosis not present

## 2016-04-20 DIAGNOSIS — E119 Type 2 diabetes mellitus without complications: Secondary | ICD-10-CM | POA: Diagnosis not present

## 2016-04-20 LAB — HEPATIC FUNCTION PANEL
ALBUMIN: 3.9 g/dL (ref 3.5–5.0)
ALK PHOS: 81 U/L (ref 38–126)
ALT: 12 U/L — AB (ref 14–54)
AST: 18 U/L (ref 15–41)
Bilirubin, Direct: 0.3 mg/dL (ref 0.1–0.5)
Indirect Bilirubin: 0.5 mg/dL (ref 0.3–0.9)
TOTAL PROTEIN: 7.6 g/dL (ref 6.5–8.1)
Total Bilirubin: 0.8 mg/dL (ref 0.3–1.2)

## 2016-04-20 LAB — BASIC METABOLIC PANEL
ANION GAP: 11 (ref 5–15)
BUN: 16 mg/dL (ref 6–20)
CHLORIDE: 103 mmol/L (ref 101–111)
CO2: 27 mmol/L (ref 22–32)
Calcium: 9.5 mg/dL (ref 8.9–10.3)
Creatinine, Ser: 1.55 mg/dL — ABNORMAL HIGH (ref 0.44–1.00)
GFR, EST AFRICAN AMERICAN: 39 mL/min — AB (ref >60)
GFR, EST NON AFRICAN AMERICAN: 33 mL/min — AB (ref >60)
Glucose, Bld: 80 mg/dL (ref 65–99)
POTASSIUM: 4.5 mmol/L (ref 3.5–5.1)
SODIUM: 141 mmol/L (ref 135–145)

## 2016-04-20 LAB — LIPID PANEL
CHOL/HDL RATIO: 3 ratio
Cholesterol: 89 mg/dL (ref 0–200)
HDL: 30 mg/dL — AB (ref >40)
LDL Cholesterol: 41 mg/dL (ref 0–99)
Triglycerides: 90 mg/dL (ref <150)
VLDL: 18 mg/dL (ref 0–40)

## 2016-04-20 IMAGING — CR DG FOOT COMPLETE 3+V*L*
3 series · 3 of 3 positions shown · non-contrast
Comparison: None.

CLINICAL DATA: BILATERAL foot pain for 2 months LEFT greater than
RIGHT, no trauma, paresthesias at toes

EXAM:
LEFT FOOT - COMPLETE 3+ VIEW

[view not recorded (1 of 3)]
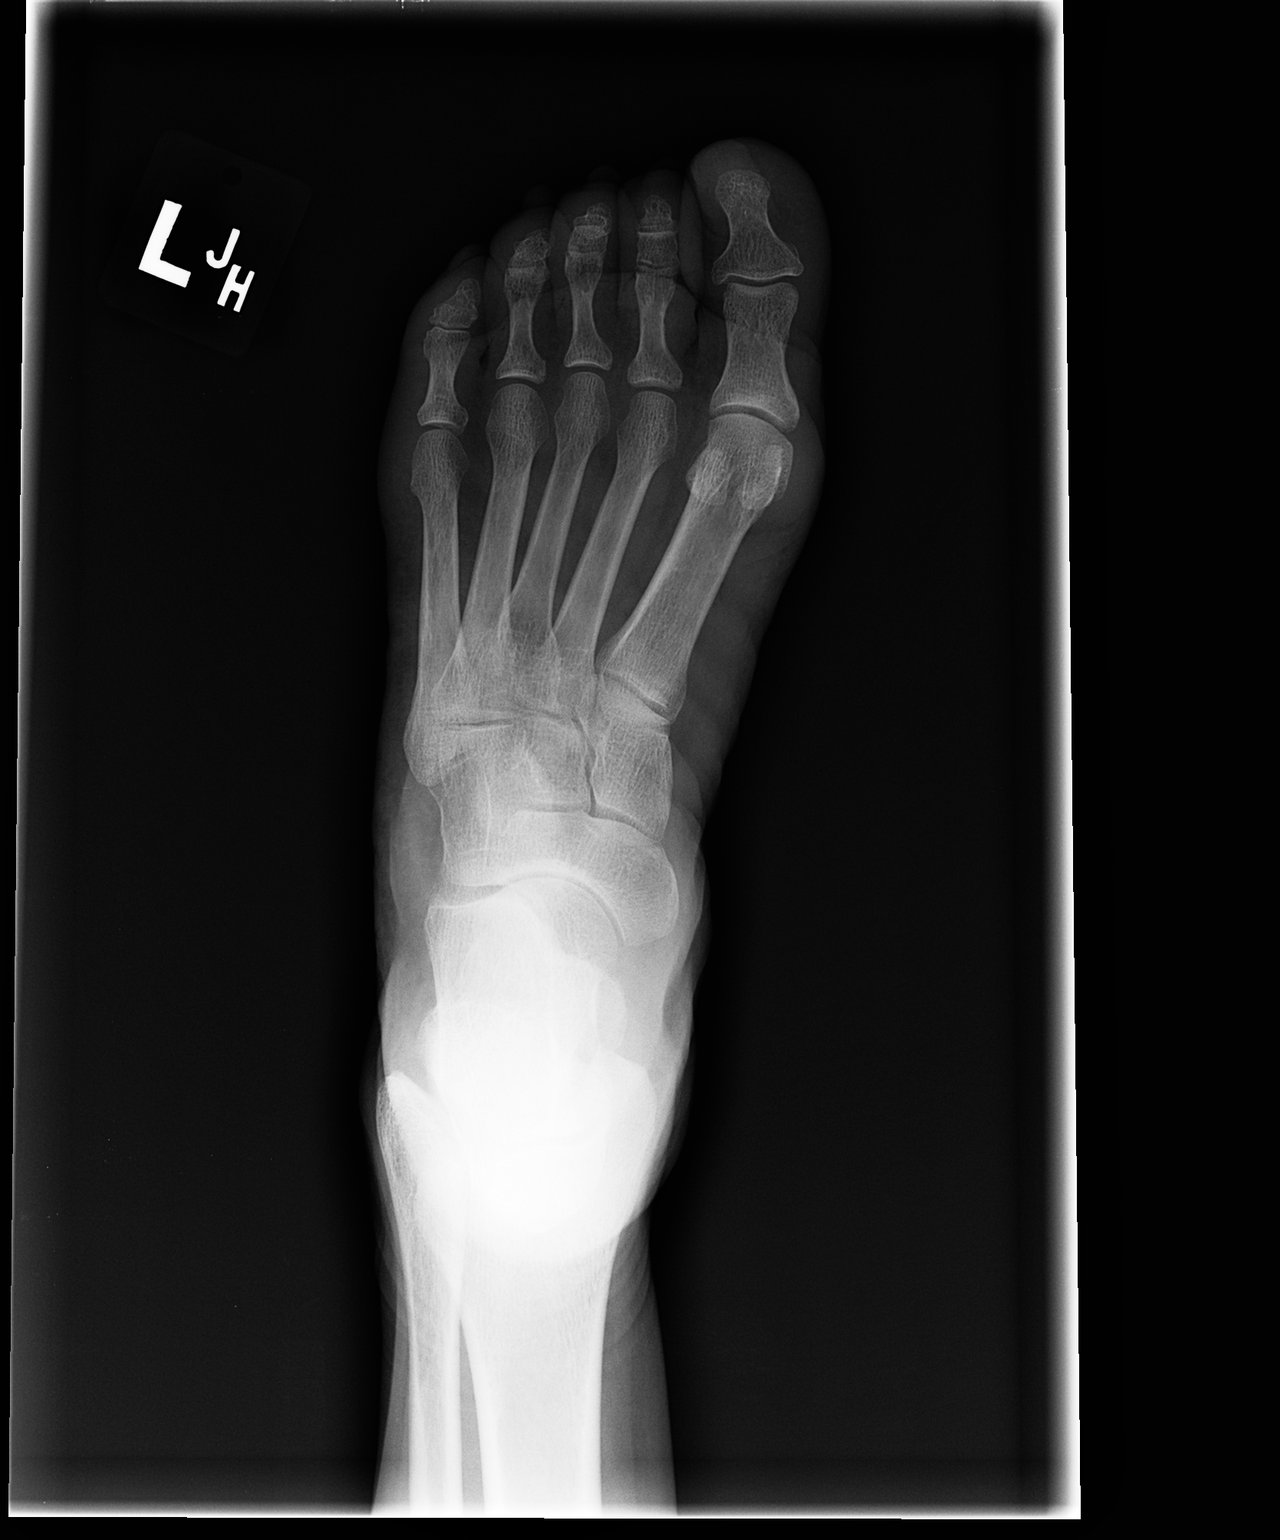

[view not recorded (2 of 3)]
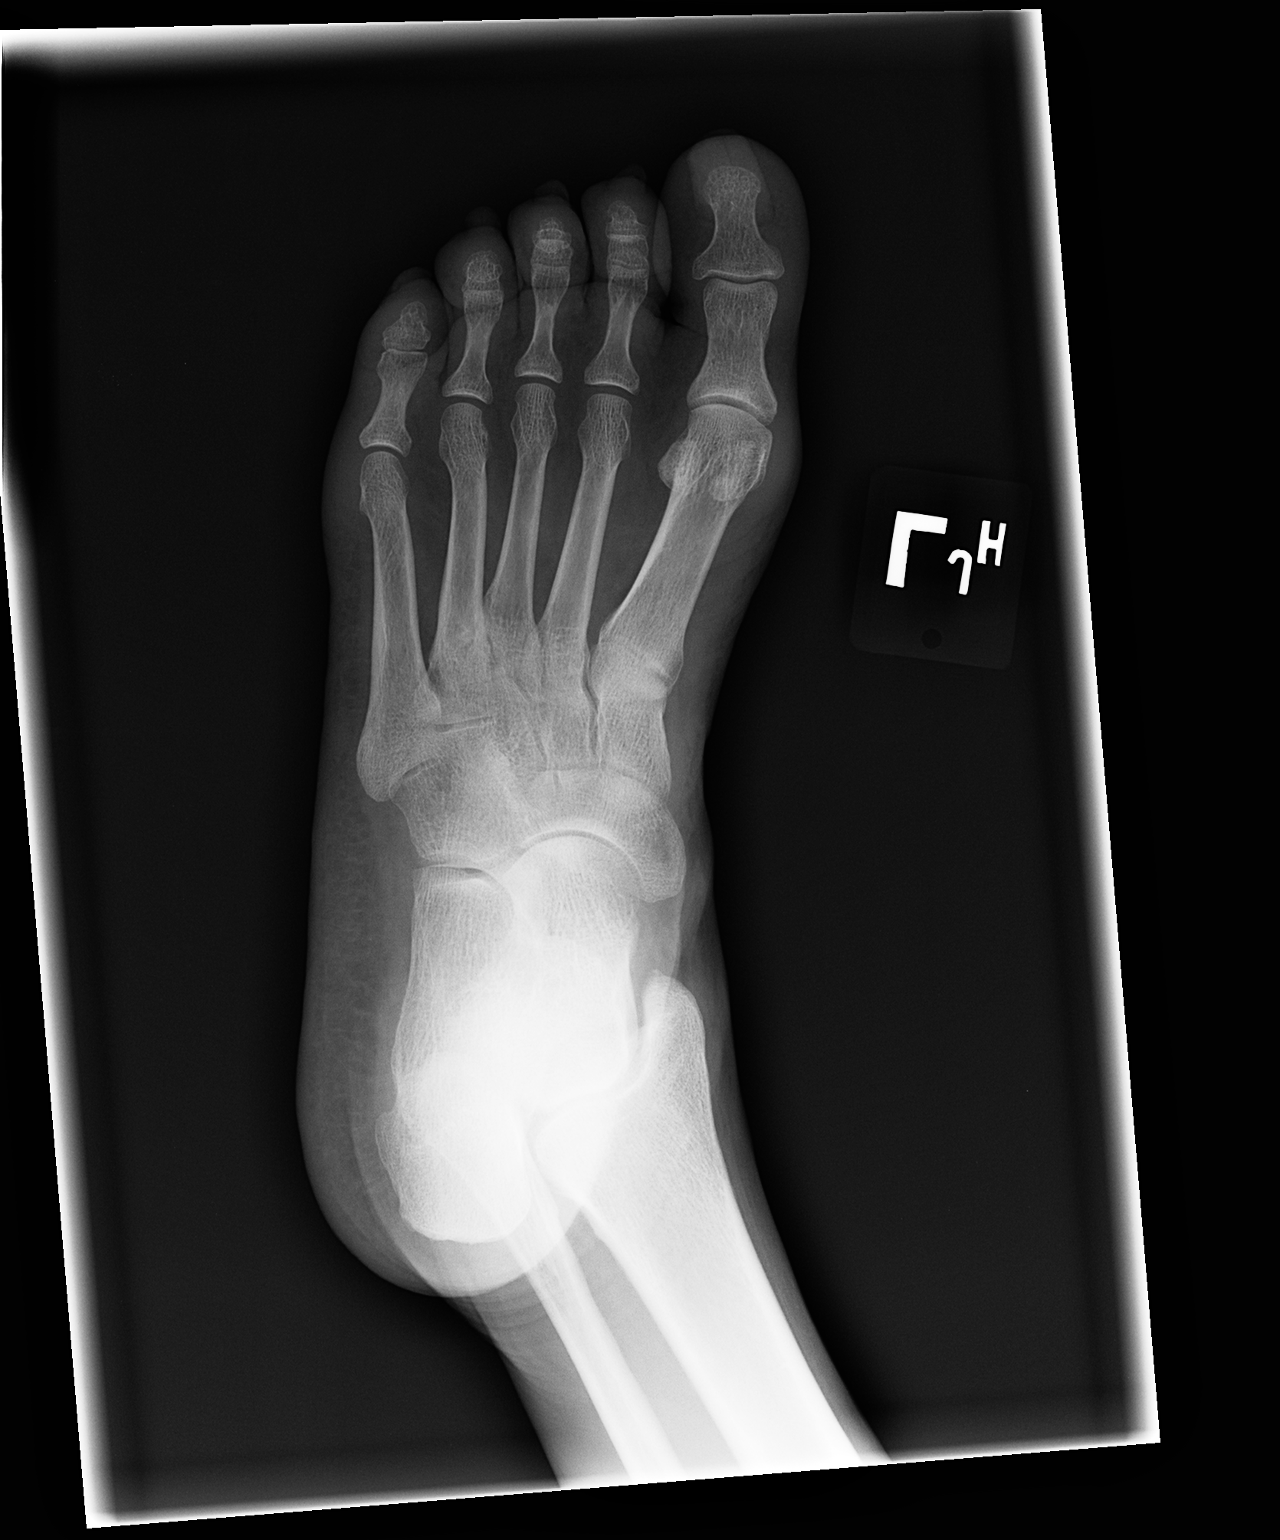

[view not recorded (3 of 3)]
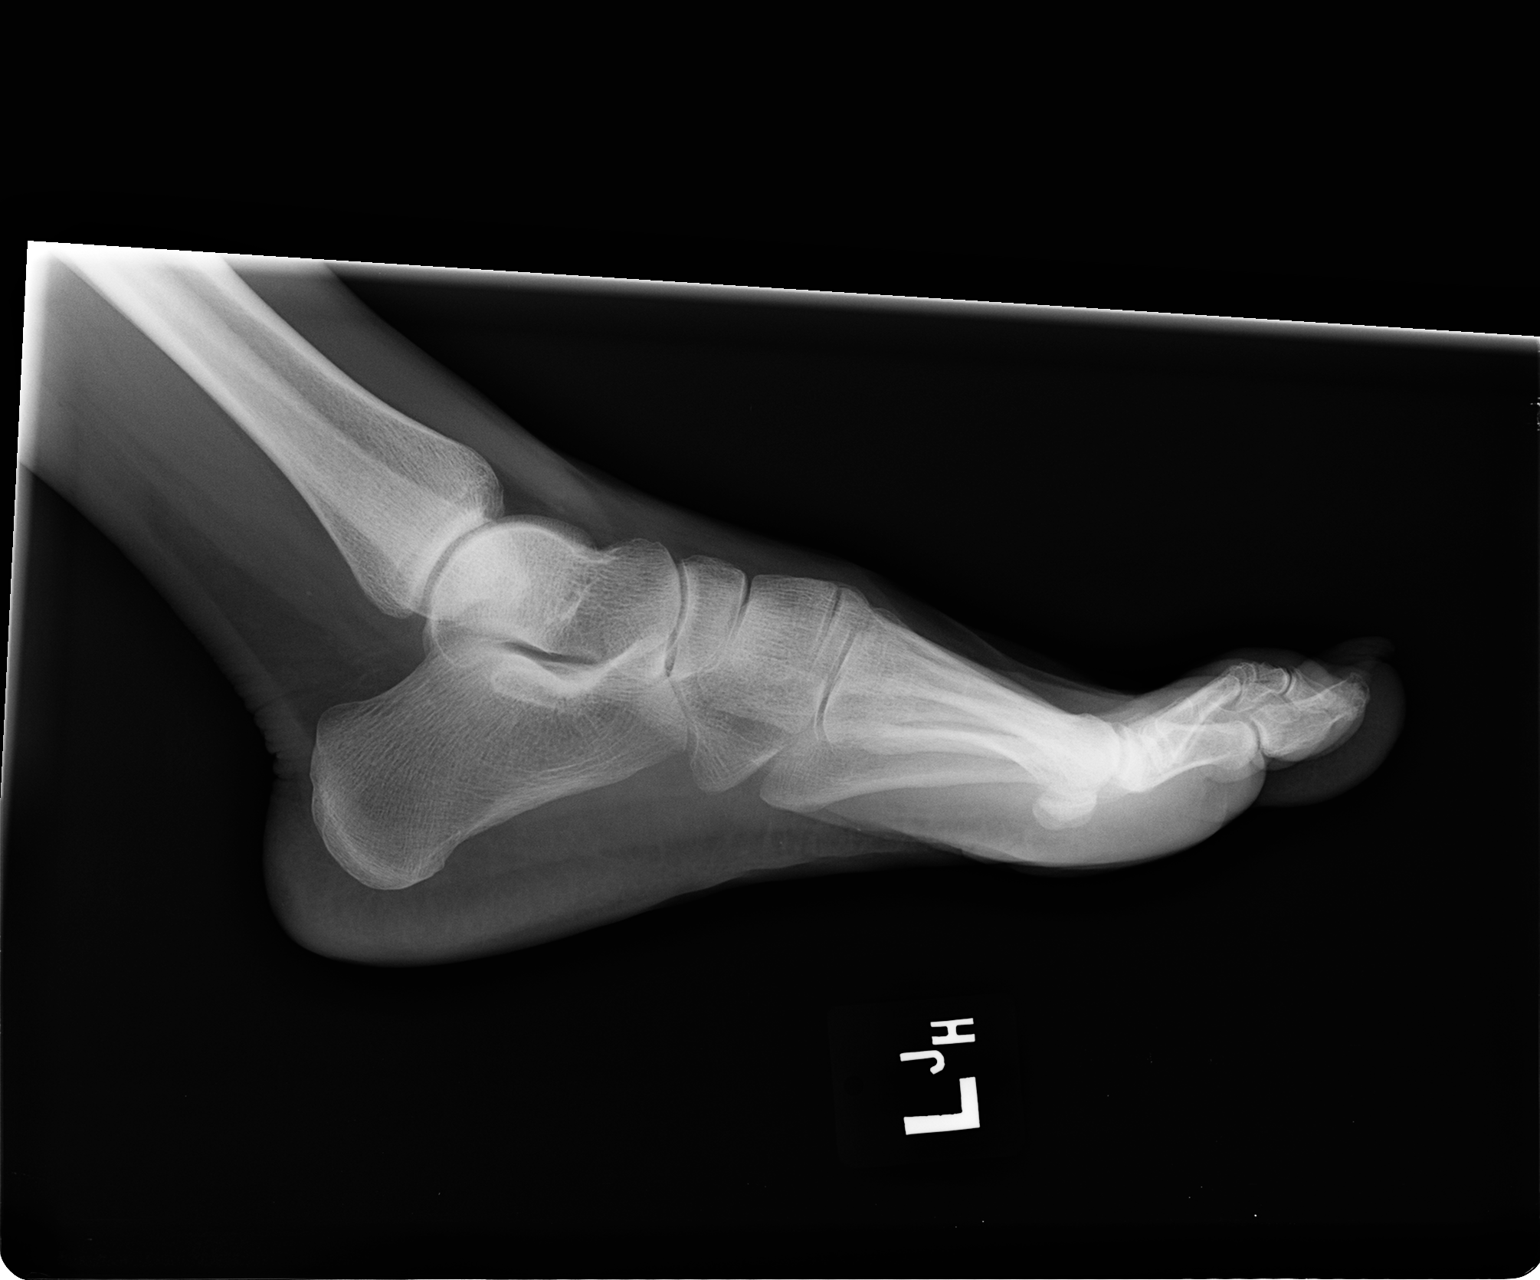

[3 of 3 positions shown; findings below may reference images not displayed]

FINDINGS: Osseous mineralization normal.

Joint spaces preserved.

No fracture, dislocation, or bone destruction.
IMPRESSION: Normal exam.

## 2016-04-20 IMAGING — CR DG FOOT COMPLETE 3+V*R*
3 series · 3 of 3 positions shown · non-contrast
Comparison: None

CLINICAL DATA: BILATERAL foot pain for 2 months LEFT greater than
RIGHT, no trauma, paresthesias at toes

EXAM:
RIGHT FOOT COMPLETE - 3+ VIEW

[view not recorded (1 of 3)]
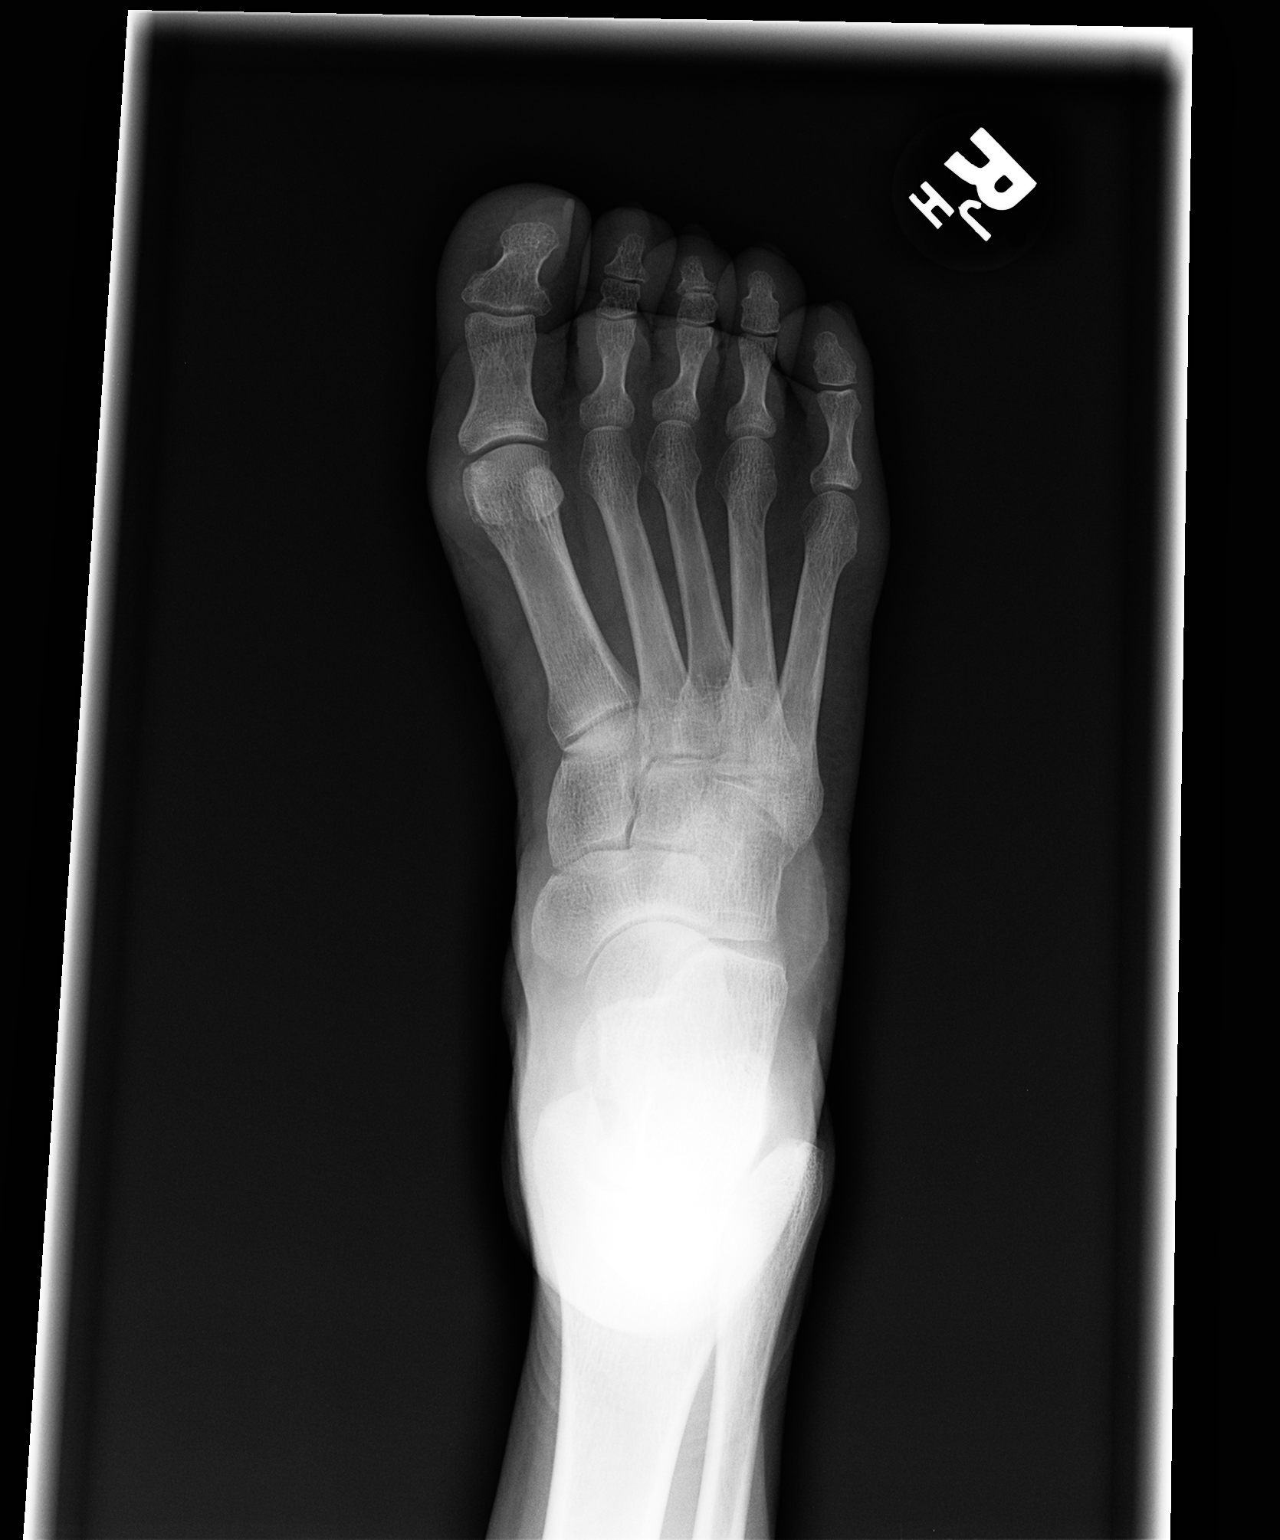

[view not recorded (2 of 3)]
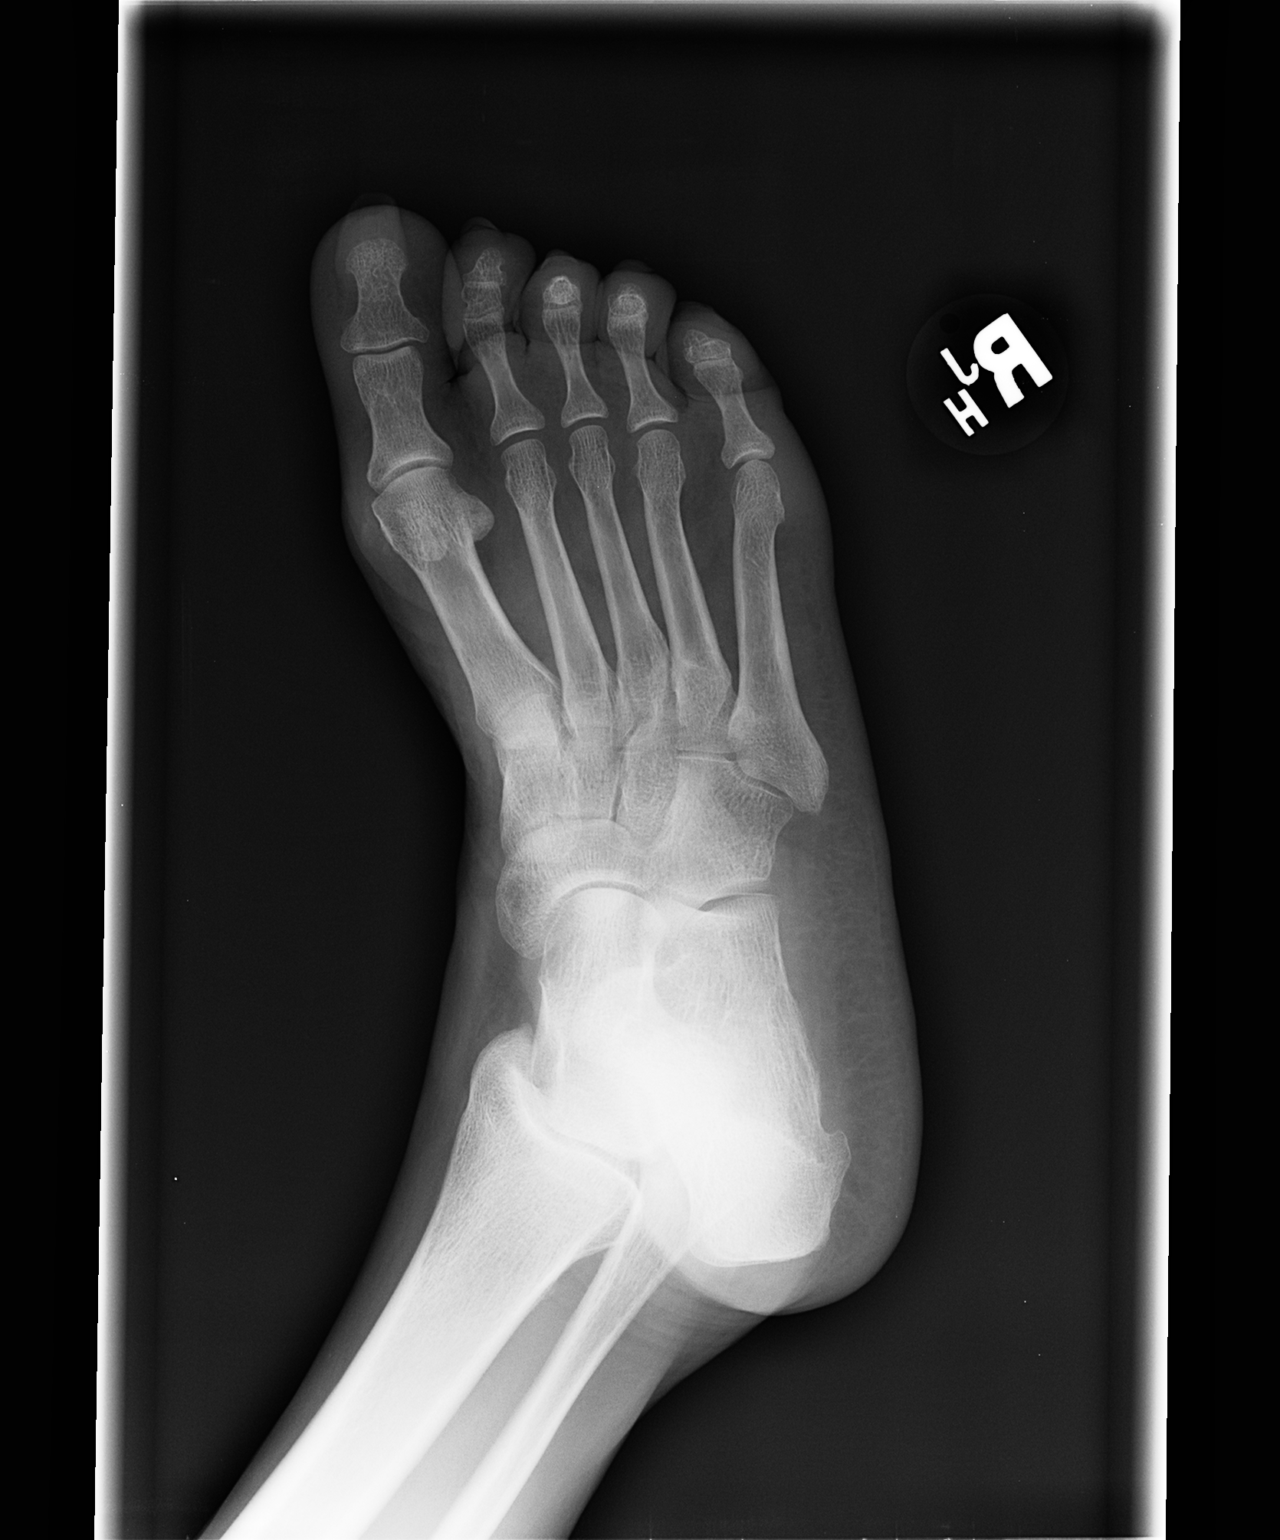

[view not recorded (3 of 3)]
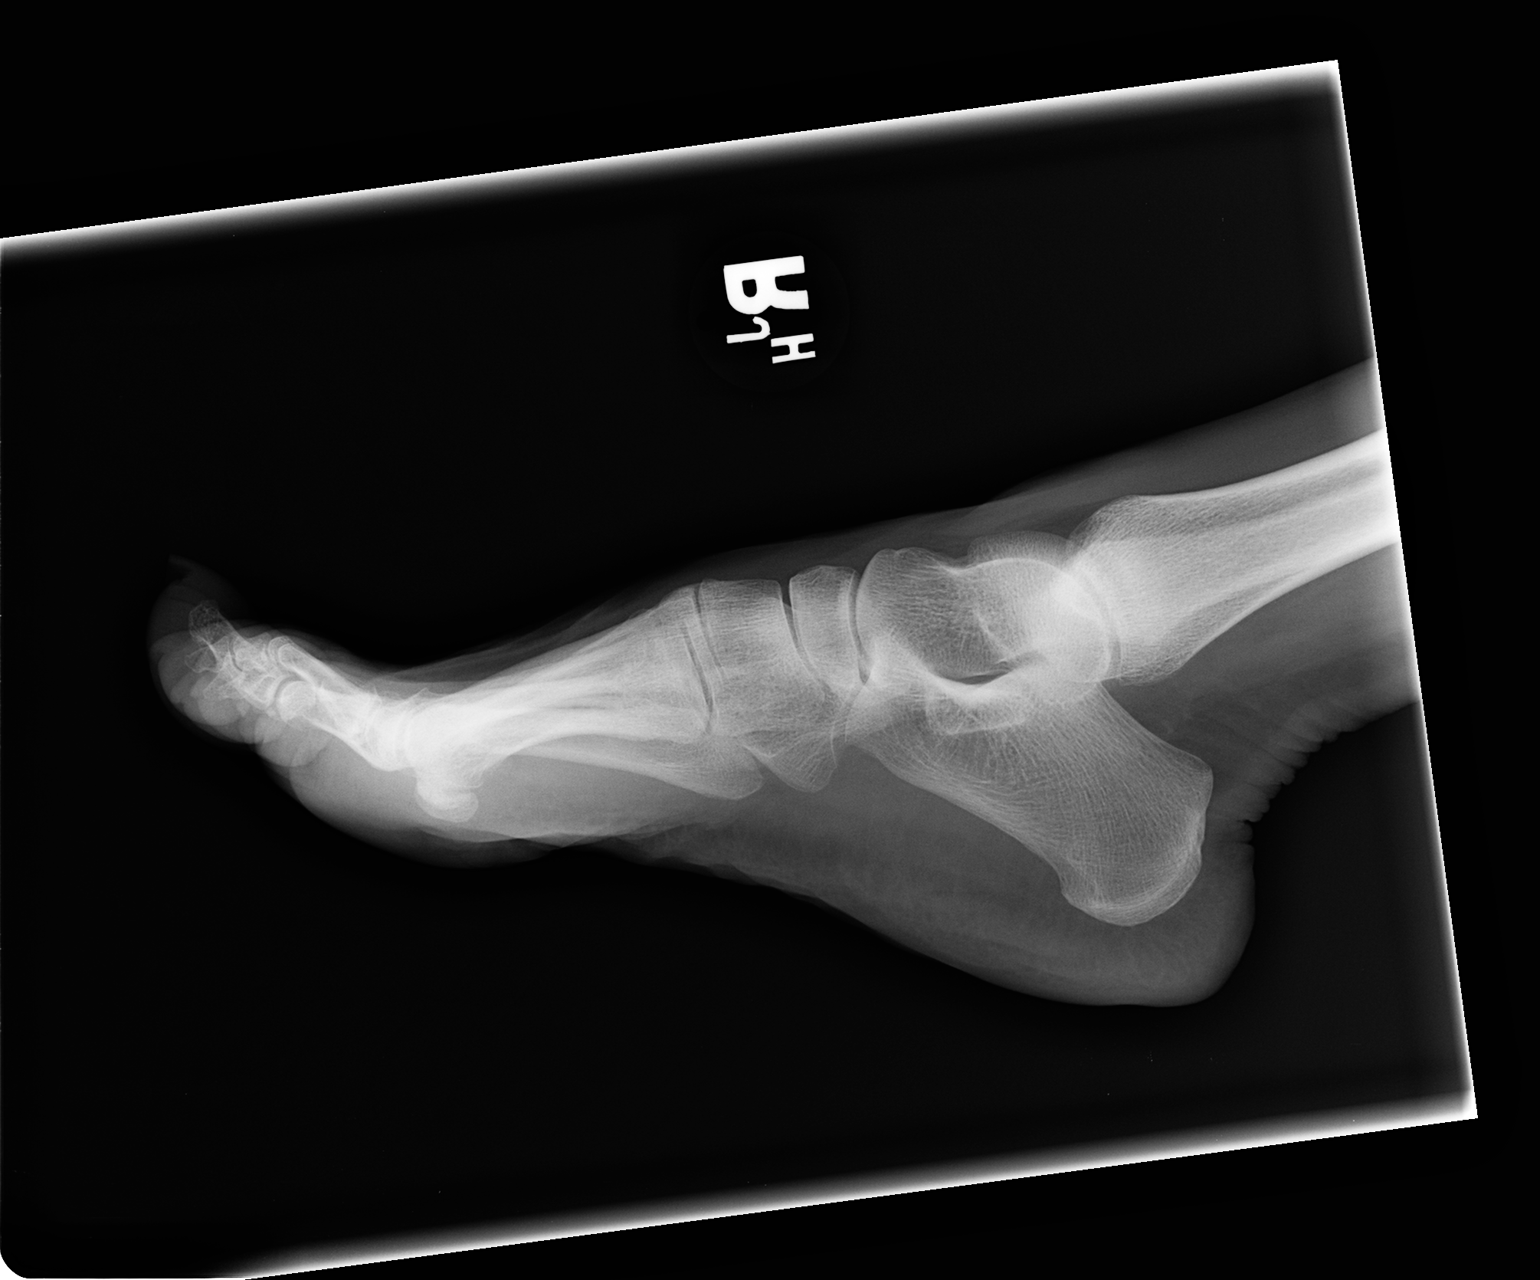

[3 of 3 positions shown; findings below may reference images not displayed]

FINDINGS: Osseous mineralization normal.

Joint spaces preserved.

No fracture, dislocation, or bone destruction.
IMPRESSION: Normal exam.

## 2016-04-20 MED ORDER — REGADENOSON 0.4 MG/5ML IV SOLN
0.4000 mg | Freq: Once | INTRAVENOUS | Status: AC
  Administered 2016-04-20: 0.4 mg via INTRAVENOUS

## 2016-04-20 MED ORDER — TECHNETIUM TC 99M TETROFOSMIN IV KIT
30.0000 | PACK | Freq: Once | INTRAVENOUS | Status: AC | PRN
  Administered 2016-04-20: 30 via INTRAVENOUS

## 2016-04-20 MED ORDER — TECHNETIUM TC 99M TETROFOSMIN IV KIT
10.0000 | PACK | Freq: Once | INTRAVENOUS | Status: AC | PRN
  Administered 2016-04-20: 10 via INTRAVENOUS

## 2016-04-20 MED ORDER — REGADENOSON 0.4 MG/5ML IV SOLN
INTRAVENOUS | Status: AC
  Filled 2016-04-20: qty 5

## 2016-04-21 LAB — HEMOGLOBIN A1C
Hgb A1c MFr Bld: 5.7 % — ABNORMAL HIGH (ref 4.8–5.6)
MEAN PLASMA GLUCOSE: 117 mg/dL

## 2016-06-26 DIAGNOSIS — Z961 Presence of intraocular lens: Secondary | ICD-10-CM | POA: Diagnosis not present

## 2016-06-26 DIAGNOSIS — H26492 Other secondary cataract, left eye: Secondary | ICD-10-CM | POA: Diagnosis not present

## 2016-06-26 DIAGNOSIS — H0289 Other specified disorders of eyelid: Secondary | ICD-10-CM | POA: Diagnosis not present

## 2016-06-26 DIAGNOSIS — E119 Type 2 diabetes mellitus without complications: Secondary | ICD-10-CM | POA: Diagnosis not present

## 2016-06-26 DIAGNOSIS — H35371 Puckering of macula, right eye: Secondary | ICD-10-CM | POA: Diagnosis not present

## 2016-07-03 DIAGNOSIS — I10 Essential (primary) hypertension: Secondary | ICD-10-CM | POA: Diagnosis not present

## 2016-07-03 DIAGNOSIS — M1990 Unspecified osteoarthritis, unspecified site: Secondary | ICD-10-CM | POA: Diagnosis not present

## 2016-07-03 DIAGNOSIS — I25118 Atherosclerotic heart disease of native coronary artery with other forms of angina pectoris: Secondary | ICD-10-CM | POA: Diagnosis not present

## 2016-07-03 DIAGNOSIS — E785 Hyperlipidemia, unspecified: Secondary | ICD-10-CM | POA: Diagnosis not present

## 2016-07-03 DIAGNOSIS — E119 Type 2 diabetes mellitus without complications: Secondary | ICD-10-CM | POA: Diagnosis not present

## 2016-07-17 DIAGNOSIS — E785 Hyperlipidemia, unspecified: Secondary | ICD-10-CM | POA: Diagnosis not present

## 2016-07-17 DIAGNOSIS — Z961 Presence of intraocular lens: Secondary | ICD-10-CM | POA: Diagnosis not present

## 2016-07-17 DIAGNOSIS — M1010 Lead-induced gout, unspecified site: Secondary | ICD-10-CM | POA: Diagnosis not present

## 2016-07-17 DIAGNOSIS — E119 Type 2 diabetes mellitus without complications: Secondary | ICD-10-CM | POA: Diagnosis not present

## 2016-07-17 DIAGNOSIS — I10 Essential (primary) hypertension: Secondary | ICD-10-CM | POA: Diagnosis not present

## 2016-07-23 DIAGNOSIS — M1000 Idiopathic gout, unspecified site: Secondary | ICD-10-CM | POA: Diagnosis not present

## 2016-07-23 DIAGNOSIS — I10 Essential (primary) hypertension: Secondary | ICD-10-CM | POA: Diagnosis not present

## 2016-07-23 DIAGNOSIS — E119 Type 2 diabetes mellitus without complications: Secondary | ICD-10-CM | POA: Diagnosis not present

## 2016-07-23 DIAGNOSIS — J441 Chronic obstructive pulmonary disease with (acute) exacerbation: Secondary | ICD-10-CM | POA: Diagnosis not present

## 2016-09-21 DIAGNOSIS — I10 Essential (primary) hypertension: Secondary | ICD-10-CM | POA: Diagnosis not present

## 2016-09-21 DIAGNOSIS — G5603 Carpal tunnel syndrome, bilateral upper limbs: Secondary | ICD-10-CM | POA: Diagnosis not present

## 2016-09-21 DIAGNOSIS — E119 Type 2 diabetes mellitus without complications: Secondary | ICD-10-CM | POA: Diagnosis not present

## 2016-09-21 DIAGNOSIS — E782 Mixed hyperlipidemia: Secondary | ICD-10-CM | POA: Diagnosis not present

## 2016-10-02 DIAGNOSIS — I25118 Atherosclerotic heart disease of native coronary artery with other forms of angina pectoris: Secondary | ICD-10-CM | POA: Diagnosis not present

## 2016-10-02 DIAGNOSIS — E785 Hyperlipidemia, unspecified: Secondary | ICD-10-CM | POA: Diagnosis not present

## 2016-10-02 DIAGNOSIS — E119 Type 2 diabetes mellitus without complications: Secondary | ICD-10-CM | POA: Diagnosis not present

## 2016-10-02 DIAGNOSIS — M1990 Unspecified osteoarthritis, unspecified site: Secondary | ICD-10-CM | POA: Diagnosis not present

## 2016-10-02 DIAGNOSIS — I10 Essential (primary) hypertension: Secondary | ICD-10-CM | POA: Diagnosis not present

## 2016-10-25 DIAGNOSIS — E782 Mixed hyperlipidemia: Secondary | ICD-10-CM | POA: Diagnosis not present

## 2016-10-25 DIAGNOSIS — G5603 Carpal tunnel syndrome, bilateral upper limbs: Secondary | ICD-10-CM | POA: Diagnosis not present

## 2016-10-25 DIAGNOSIS — E119 Type 2 diabetes mellitus without complications: Secondary | ICD-10-CM | POA: Diagnosis not present

## 2016-10-25 DIAGNOSIS — G4700 Insomnia, unspecified: Secondary | ICD-10-CM | POA: Diagnosis not present

## 2016-10-25 DIAGNOSIS — Z23 Encounter for immunization: Secondary | ICD-10-CM | POA: Diagnosis not present

## 2016-11-19 ENCOUNTER — Encounter: Payer: Medicare Other | Admitting: Neurology

## 2016-11-26 ENCOUNTER — Encounter: Payer: Self-pay | Admitting: Neurology

## 2016-11-26 ENCOUNTER — Ambulatory Visit (INDEPENDENT_AMBULATORY_CARE_PROVIDER_SITE_OTHER): Payer: Medicare Other | Admitting: Neurology

## 2016-11-26 ENCOUNTER — Ambulatory Visit (INDEPENDENT_AMBULATORY_CARE_PROVIDER_SITE_OTHER): Payer: Self-pay | Admitting: Neurology

## 2016-11-26 DIAGNOSIS — G5621 Lesion of ulnar nerve, right upper limb: Secondary | ICD-10-CM

## 2016-11-26 DIAGNOSIS — G5603 Carpal tunnel syndrome, bilateral upper limbs: Secondary | ICD-10-CM | POA: Diagnosis not present

## 2016-11-26 HISTORY — DX: Lesion of ulnar nerve, right upper limb: G56.21

## 2016-11-26 HISTORY — DX: Carpal tunnel syndrome, bilateral upper limbs: G56.03

## 2016-11-26 NOTE — Progress Notes (Signed)
Please refer to EMG and nerve conduction study procedure note. 

## 2016-11-26 NOTE — Procedures (Signed)
     HISTORY:  [name] is a 66 year old patient with a history of diabetes who reports a one-year history of pain and discomfort in both hands, left greater than right. The patient denies any significant neck pain or pain down the arms. She is being evaluated for a possible neuropathy or a cervical radiculopathy.  NERVE CONDUCTION STUDIES:  Nerve conduction studies were performed on both upper extremities. The distal motor latencies for the median nerves were prolonged bilaterally with normal motor amplitudes for these nerves bilaterally. The distal motor latency for the right ulnar nerve was prolonged with a low motor amplitude seen. Slowing was seen above and below the elbow on the right ulnar nerve. The distal motor latency and motor amplitude for the left ulnar nerve were normal with normal nerve conduction velocities for this nerve. The nerve conduction velocities for the median nerves were normal bilaterally. The sensory latencies for the median nerves were prolonged bilaterally and were unobtainable for the right ulnar nerve, normal for the left ulnar nerve. The F wave latencies for the median nerves bilaterally were normal and were normal for the left ulnar nerve, prolonged for the right ulnar nerve.  EMG STUDIES:  EMG study was performed on the rght upper extremity:  The first dorsal interosseous muscle reveals 2 to 5 K units with decreased recruitment. No fibrillations or positive waves were noted. The abductor pollicis brevis muscle reveals 2 to 4 K units with slightly decreased recruitment. No fibrillations or positive waves were noted. The extensor indicis proprius muscle reveals 1 to 3 K units with full recruitment. No fibrillations or positive waves were noted. The pronator teres muscle reveals 2 to 3 K units with full recruitment. No fibrillations or positive waves were noted. The biceps muscle reveals 1 to 2 K units with full recruitment. No fibrillations or positive waves  were noted. The triceps muscle reveals 2 to 4 K units with full recruitment. No fibrillations or positive waves were noted. The anterior deltoid muscle reveals 2 to 6 K units with decreased recruitment. No fibrillations or positive waves were noted. The supraspinatus muscle reveals 2 to 4 K units with slightly decreased recruitment. No fibrillations or positive waves were seen. The cervical paraspinal muscles were tested at 2 levels. No abnormalities of insertional activity were seen at either level tested. There was fair relaxation.   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 EMG evaluation of the right upper extremity shows findings consistent with the carpal tunnel syndrome and ulnar neuropathy, there is some evidence of an overlying chronic stable C5 radiculopathy as well.  Jill Alexanders MD 11/26/2016 2:09 PM  [facility] [address] [place], [place] [address]  Phone [phone] Fax [phone]

## 2016-11-27 NOTE — Progress Notes (Signed)
    Sherrill    Nerve / Sites Muscle Latency Ref. Amplitude Ref. Rel Amp Segments Distance Velocity Ref. Area    ms ms mV mV %  cm m/s m/s mVms  L Median - APB     Wrist APB 4.7 ?4.4 11.8 ?4.0 100 Wrist - APB 7   40.2     Upper arm APB 8.4  10.8  92 Upper arm - Wrist 23 62 ?49 37.5  R Median - APB     Wrist APB 5.3 ?4.4 13.7 ?4.0 100 Wrist - APB 7   48.5     Upper arm APB 9.1  12.9  94.7 Upper arm - Wrist 23 60 ?49 45.1  L Ulnar - ADM     Wrist ADM 3.1 ?3.3 9.0 ?6.0 100 Wrist - ADM 7   28.1     B.Elbow ADM 7.1  7.8  86.5 B.Elbow - Wrist 21 52 ?49 25.2     A.Elbow ADM 9.1  7.6  97 A.Elbow - B.Elbow 10 52 ?49 27.5         A.Elbow - Wrist      R Ulnar - ADM     Wrist ADM 4.4 ?3.3 3.4 ?6.0 100 Wrist - ADM 7   16.0     B.Elbow ADM 11.5  1.1  33.8 B.Elbow - Wrist 21 29 ?49 7.5     A.Elbow ADM 14.6  1.1  94.8 A.Elbow - B.Elbow 10 33 ?49 4.5         A.Elbow - Wrist                 SNC    Nerve / Sites Rec. Site Peak Lat Amp Segments Distance    ms V  cm  L Median - Orthodromic (Dig II, Mid palm)     Dig II Wrist 4.5 35 Dig II - Wrist 13  R Median - Orthodromic (Dig II, Mid palm)     Dig II Wrist 5.1 16 Dig II - Wrist 13  L Ulnar - Orthodromic, (Dig V, Mid palm)     Dig V Wrist 3.5 20 Dig V - Wrist 11  R Ulnar - Orthodromic, (Dig V, Mid palm)     Dig V Wrist NR NR Dig V - Wrist 11             F  Wave    Nerve F Lat Ref.   ms ms  L Median - APB 27.7 ?31.0  L Ulnar - ADM 29.5 ?32.0  R Median - APB 30.3 ?31.0  R Ulnar - ADM 41.8 ?32.0             EMG       EMG full       EMG Summary Table    Spontaneous MUAP Recruitment  Muscle IA Fib PSW Fasc Other Amp Dur. Poly Pattern  R. Abductor digiti minimi (manus) Normal None None None _______ Normal Normal Normal Normal

## 2016-12-09 IMAGING — CT CT ABD-PELV W/O CM
2 of 4 series · 16 of 46 positions shown, 18 images · non-contrast
Comparison: CT 02/02/2012

CLINICAL DATA: Vomiting.  Chest pain.

EXAM:
CT ABDOMEN AND PELVIS WITHOUT CONTRAST
TECHNIQUE: Multidetector CT imaging of the abdomen and pelvis was performed
following the standard protocol without IV contrast.

[Series 4: coronals · coronal · 0.70mm/px · 3 of 127 slices shown]
[im 43/127  soft-tissue]
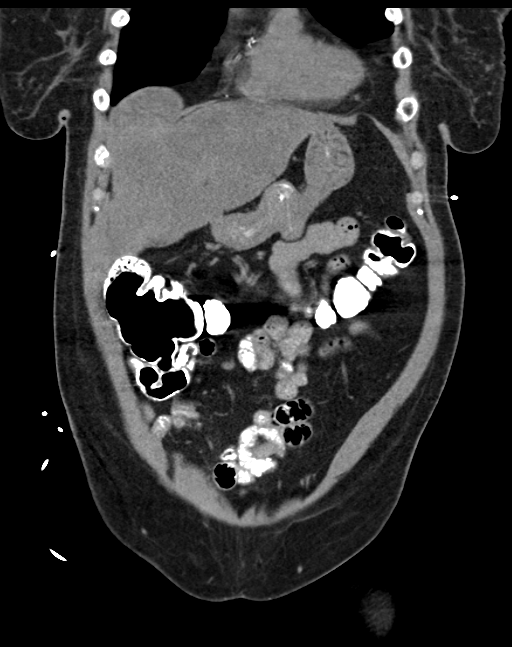
[im 57/127  soft-tissue]
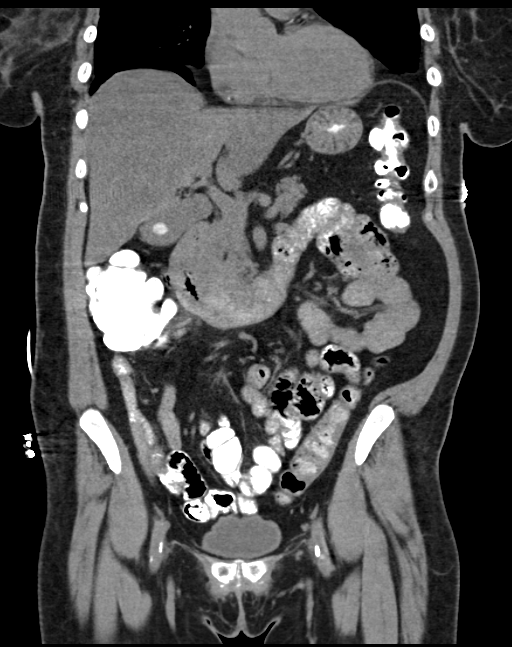
[im 71/127  soft-tissue]
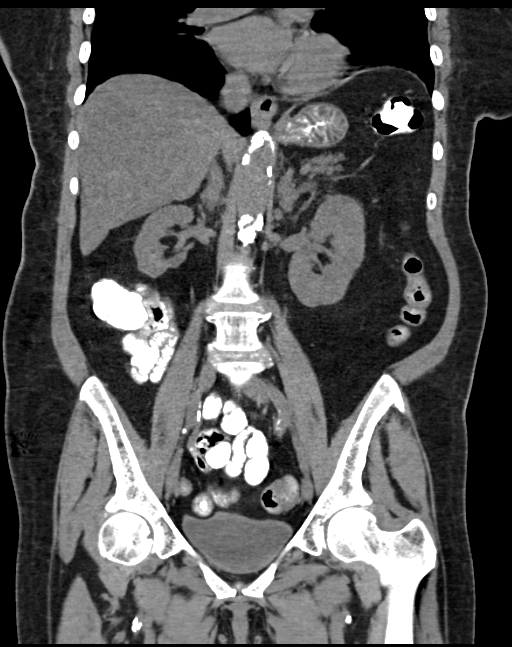

[Series 6: abd/ pelvis 5.0 i30f 1 · axial · 0.75mm/px · z∈[-399,+6]mm · 13 of 89 slices shown, 15 images]
[im 4/89  soft-tissue]
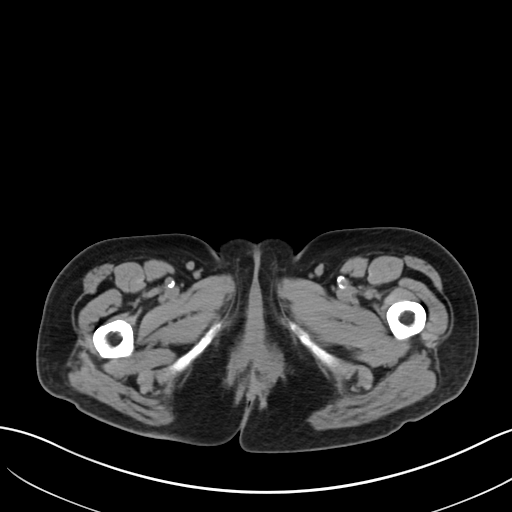
[im 4/89  bone]
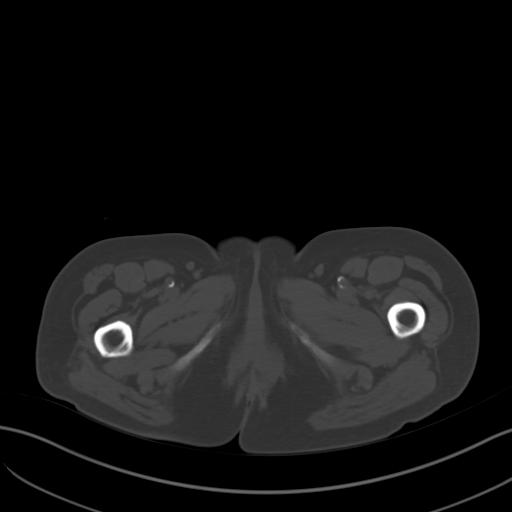
[im 11/89  soft-tissue]
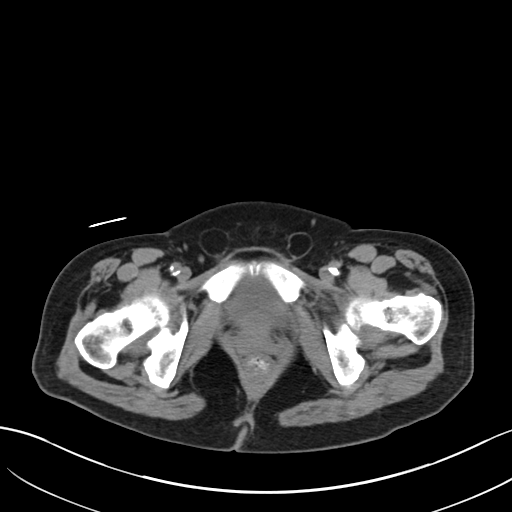
[im 18/89  soft-tissue]
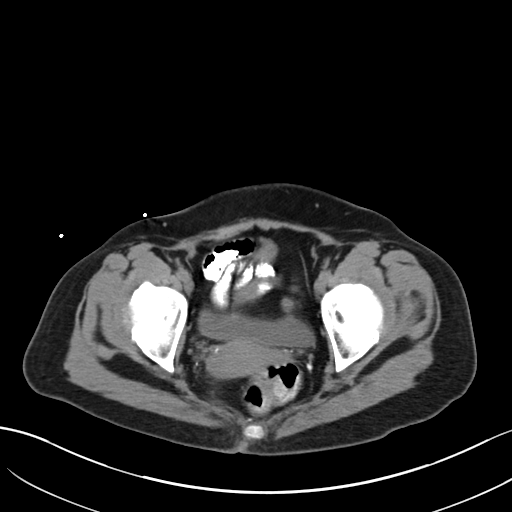
[im 25/89  soft-tissue]
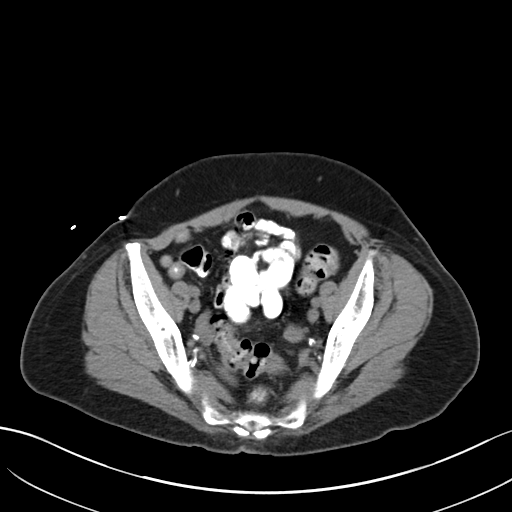
[im 32/89  soft-tissue]
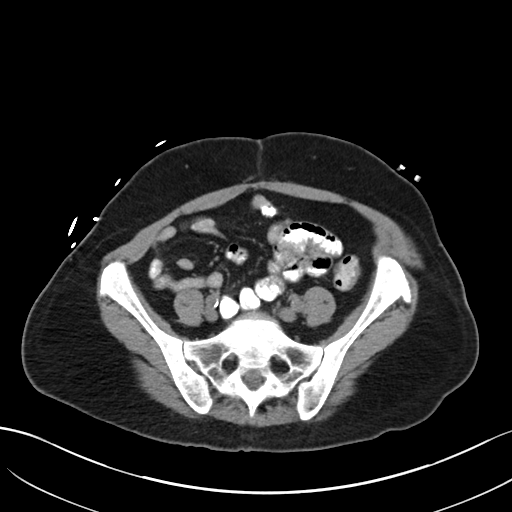
[im 39/89  soft-tissue]
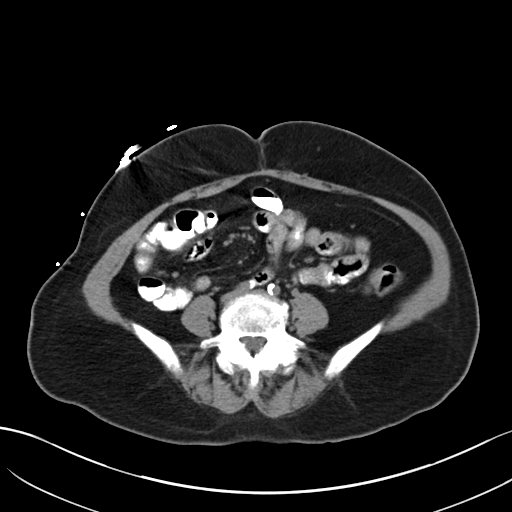
[im 46/89  soft-tissue]
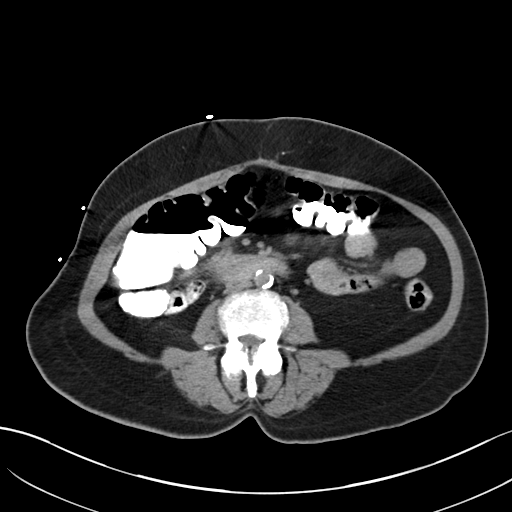
[im 50/89  soft-tissue]
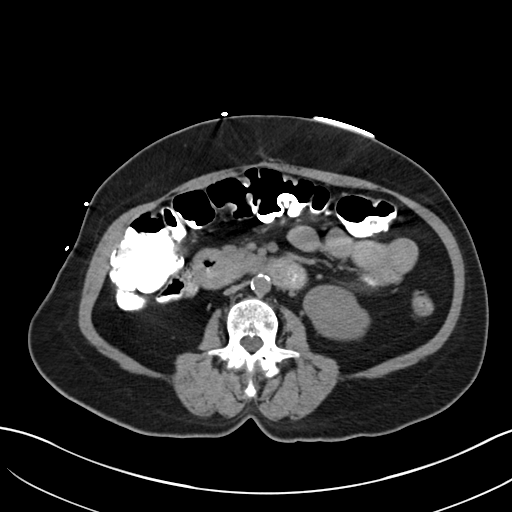
[im 57/89  soft-tissue]
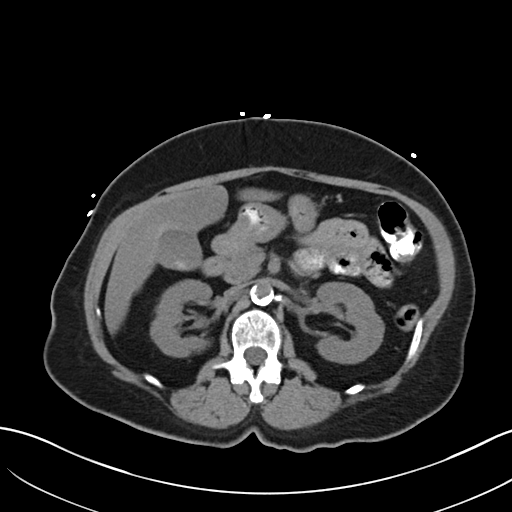
[im 57/89  bone]
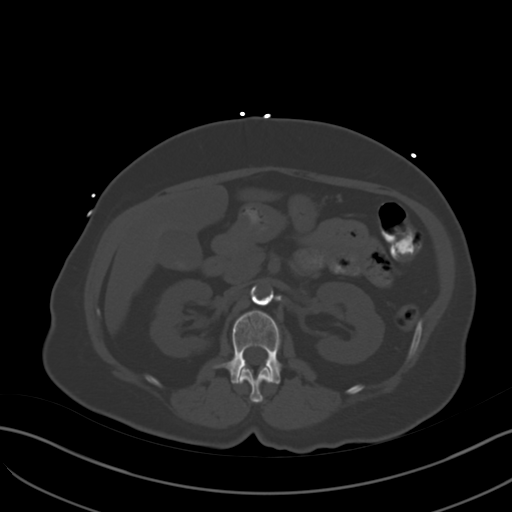
[im 64/89  soft-tissue]
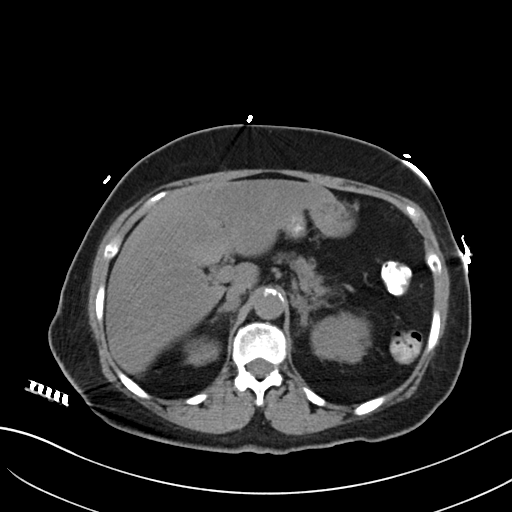
[im 71/89  soft-tissue]
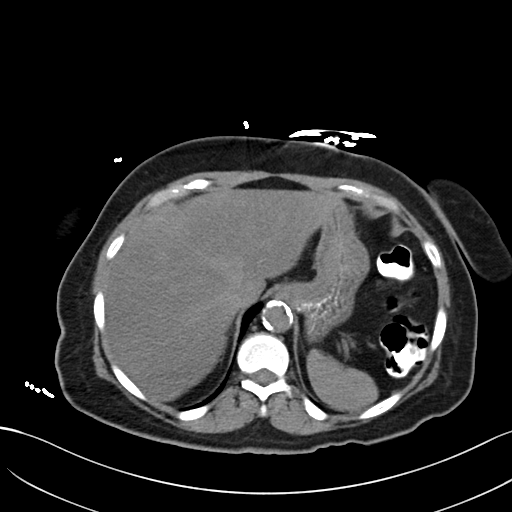
[im 78/89  soft-tissue]
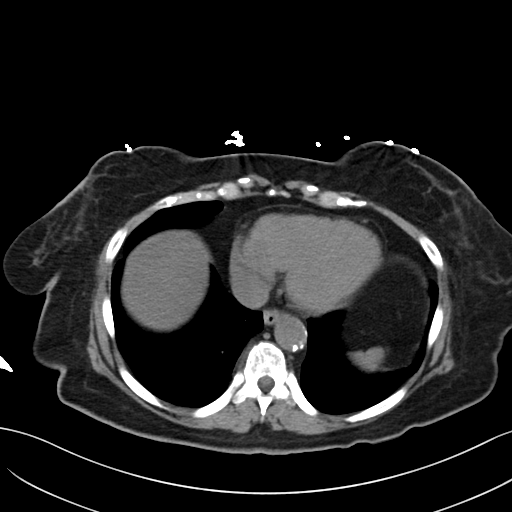
[im 85/89  soft-tissue]
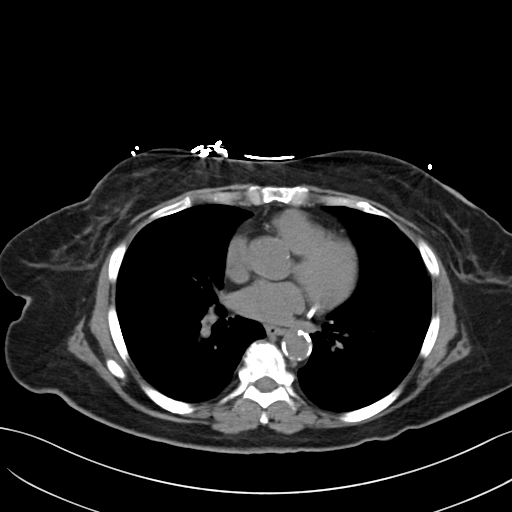

[16 of 46 positions shown; findings below may reference images not displayed]

FINDINGS: Mild atelectasis at the lung bases. There are coronary artery
calcifications. Atherosclerosis of distal descending thoracic aorta.

Diffusely decreased density of the liver parenchyma with fatty
sparing about the gallbladder fossa. No evidence of focal hepatic
lesion on noncontrast exam. Single calcified gallstone within
physiologically distended gallbladder. No pericholecystic
inflammation. No biliary dilatation. Spleen and pancreas are normal.
No adrenal nodule.

No renal stones, hydronephrosis, or perinephric stranding. Right
renal parenchymal thinning and scarring is unchanged from prior
exam.

The stomach is decompressed. There are no dilated or thickened small
bowel loops. Post partial right hemicolectomy. The appendix is not
seen. No bowel obstruction with oral contrast reaching to the
sigmoid colon. No colonic wall thickening. No diverticulitis or
colitis. No free air, free fluid, or intra-abdominal fluid
collection.

The abdominal aorta is normal in caliber with dense atherosclerosis.
There is no retroperitoneal adenopathy.

Within the pelvis the bladder is minimally distended. Uterus and
adnexa are normal for age. No pelvic free fluid. Minimal fat in both
inguinal canals.

There is multilevel facet arthropathy throughout the lumbar spine.
No acute or suspicious osseous abnormality.
IMPRESSION: 1. No acute abnormality in the abdomen/pelvis.
2. Stable chronic findings of hepatic steatosis and cholelithiasis.
No CT findings of cholecystitis. Stable right renal scarring.
3. Atherosclerosis.

## 2016-12-10 IMAGING — CR DG CHEST 1V PORT
1 series · 1 of 1 positions shown · non-contrast
Comparison: 11/13/2013

CLINICAL DATA: Epigastric pain and shortness of breath.

EXAM:
PORTABLE CHEST - 1 VIEW

[AP]
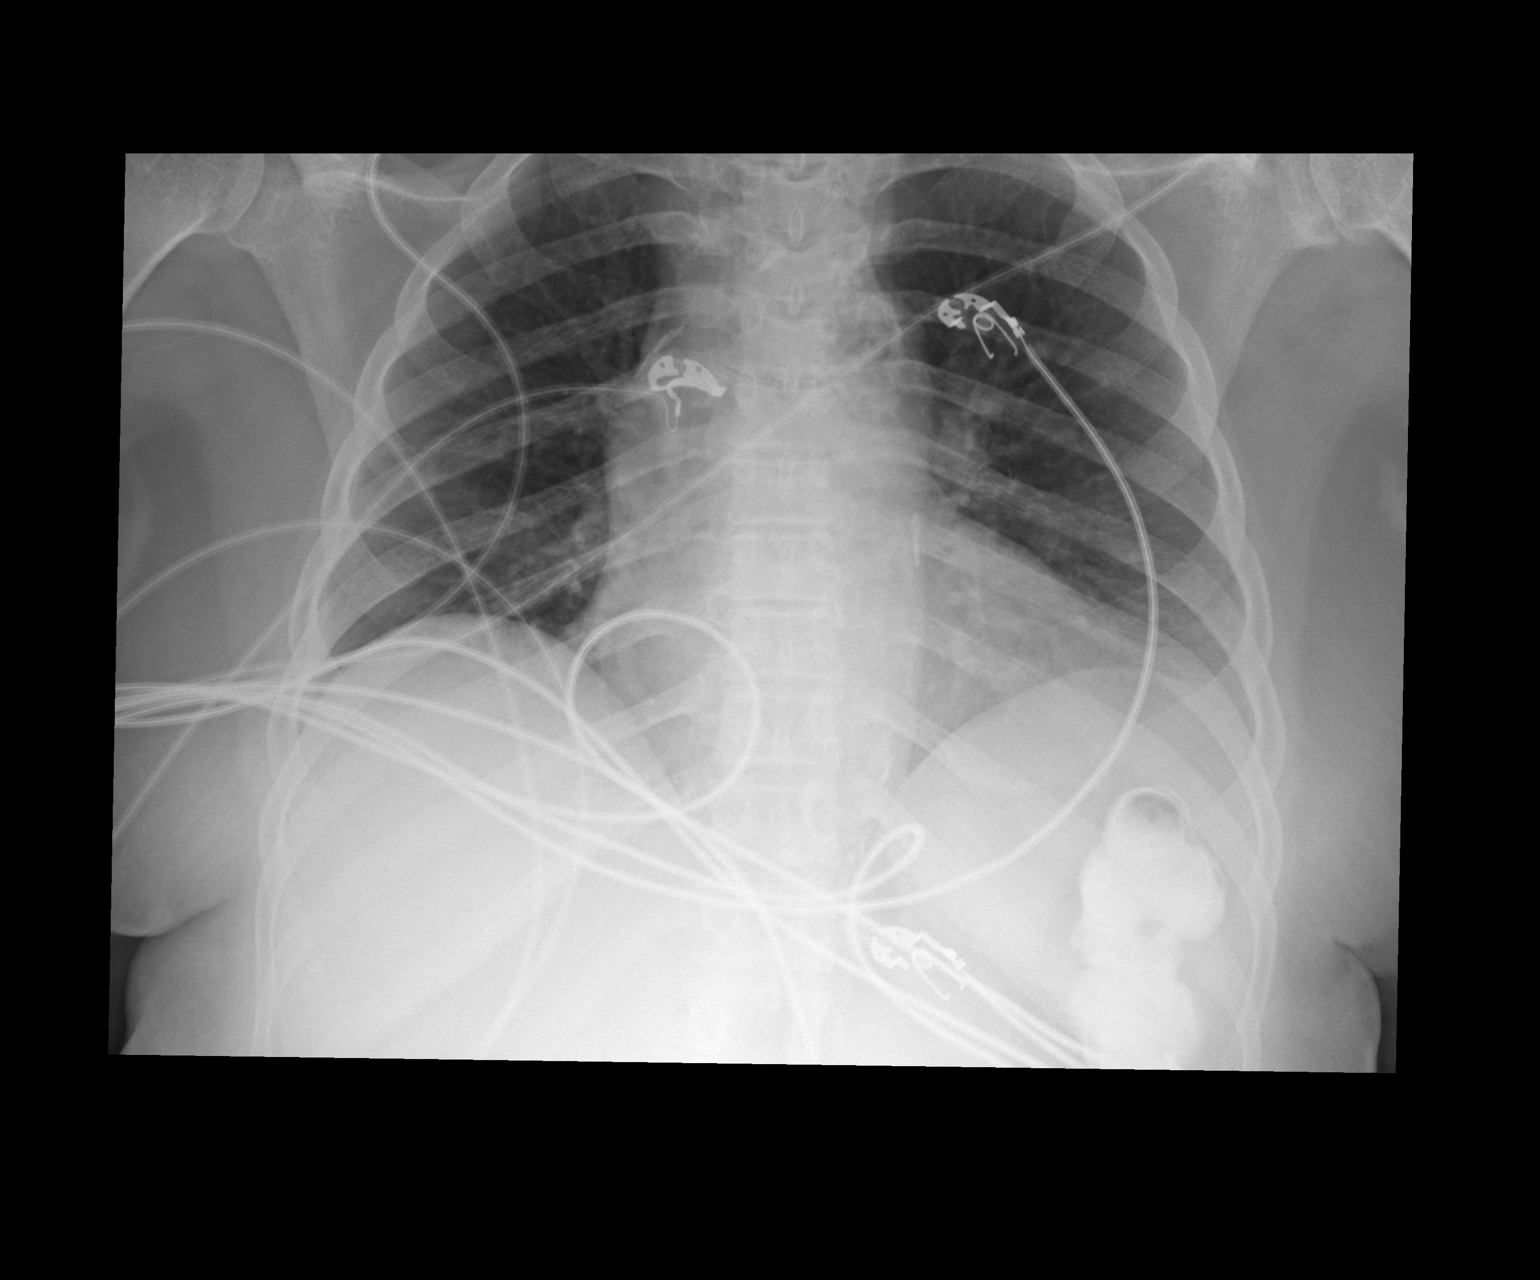

[1 of 1 positions shown; findings below may reference images not displayed]

FINDINGS: Lung volumes are low leading to accentuation of cardiac size and
crowding of bronchovascular structures. The mediastinal contours are
normal, atherosclerosis of the thoracic aorta again seen. The lungs
are clear. Pulmonary vasculature is normal. No consolidation,
pleural effusion, or pneumothorax. No acute osseous abnormalities
are seen. Degenerative change noted in the left shoulder.
IMPRESSION: Hypoventilatory chest without acute process.

## 2017-01-01 DIAGNOSIS — E119 Type 2 diabetes mellitus without complications: Secondary | ICD-10-CM | POA: Diagnosis not present

## 2017-01-01 DIAGNOSIS — E785 Hyperlipidemia, unspecified: Secondary | ICD-10-CM | POA: Diagnosis not present

## 2017-01-01 DIAGNOSIS — I25118 Atherosclerotic heart disease of native coronary artery with other forms of angina pectoris: Secondary | ICD-10-CM | POA: Diagnosis not present

## 2017-01-01 DIAGNOSIS — I10 Essential (primary) hypertension: Secondary | ICD-10-CM | POA: Diagnosis not present

## 2017-01-01 DIAGNOSIS — J45909 Unspecified asthma, uncomplicated: Secondary | ICD-10-CM | POA: Diagnosis not present

## 2017-01-25 DIAGNOSIS — G5603 Carpal tunnel syndrome, bilateral upper limbs: Secondary | ICD-10-CM | POA: Diagnosis not present

## 2017-01-25 DIAGNOSIS — E1140 Type 2 diabetes mellitus with diabetic neuropathy, unspecified: Secondary | ICD-10-CM | POA: Diagnosis not present

## 2017-01-25 DIAGNOSIS — I10 Essential (primary) hypertension: Secondary | ICD-10-CM | POA: Diagnosis not present

## 2017-02-14 IMAGING — DX DG CHEST 2V
2 series · 2 of 2 positions shown · non-contrast
Comparison: 04/01/2014

CLINICAL DATA: Shortness of breath. History of hypertension
diabetes. Smoker.

EXAM:
CHEST  2 VIEW

[chest pa]
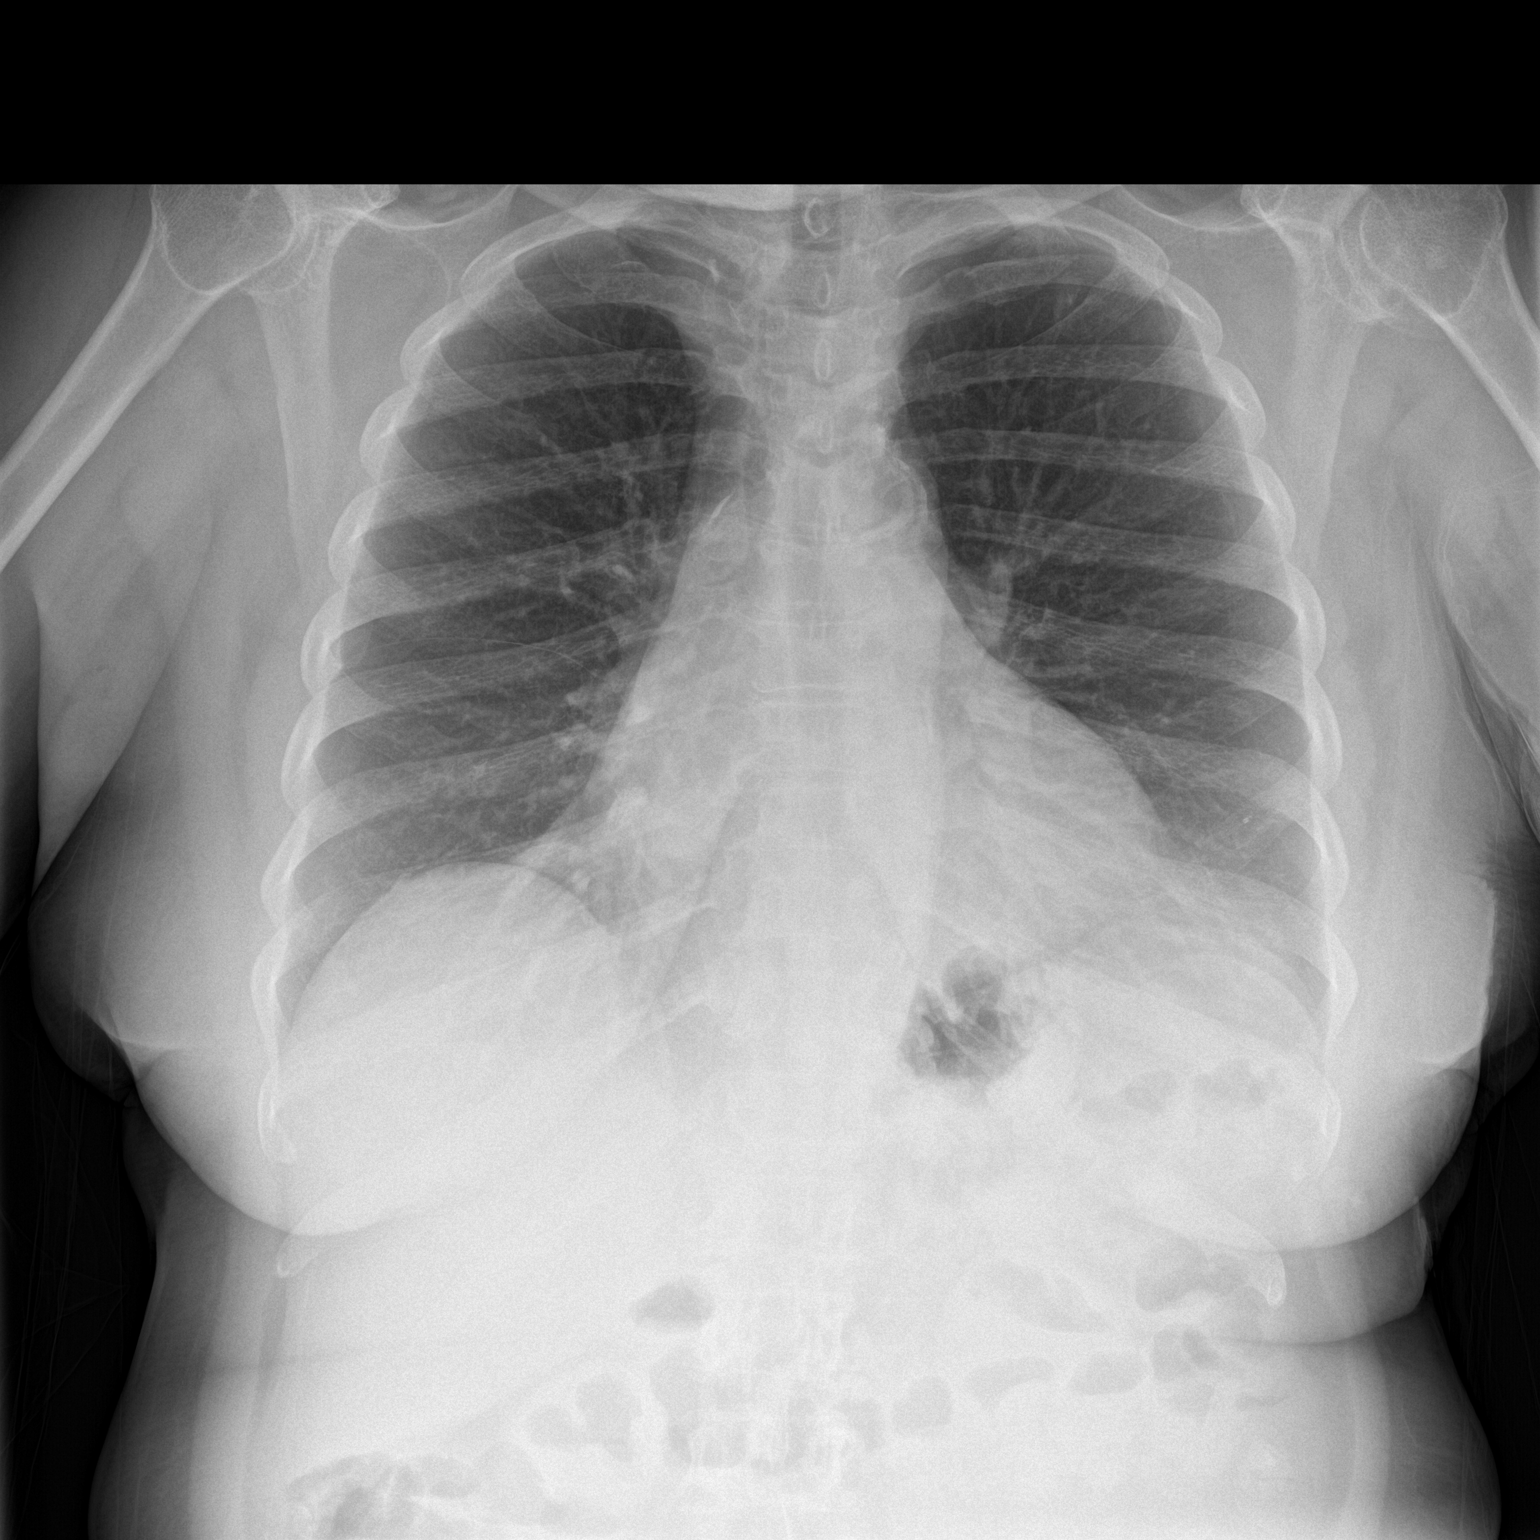

[chest lat]
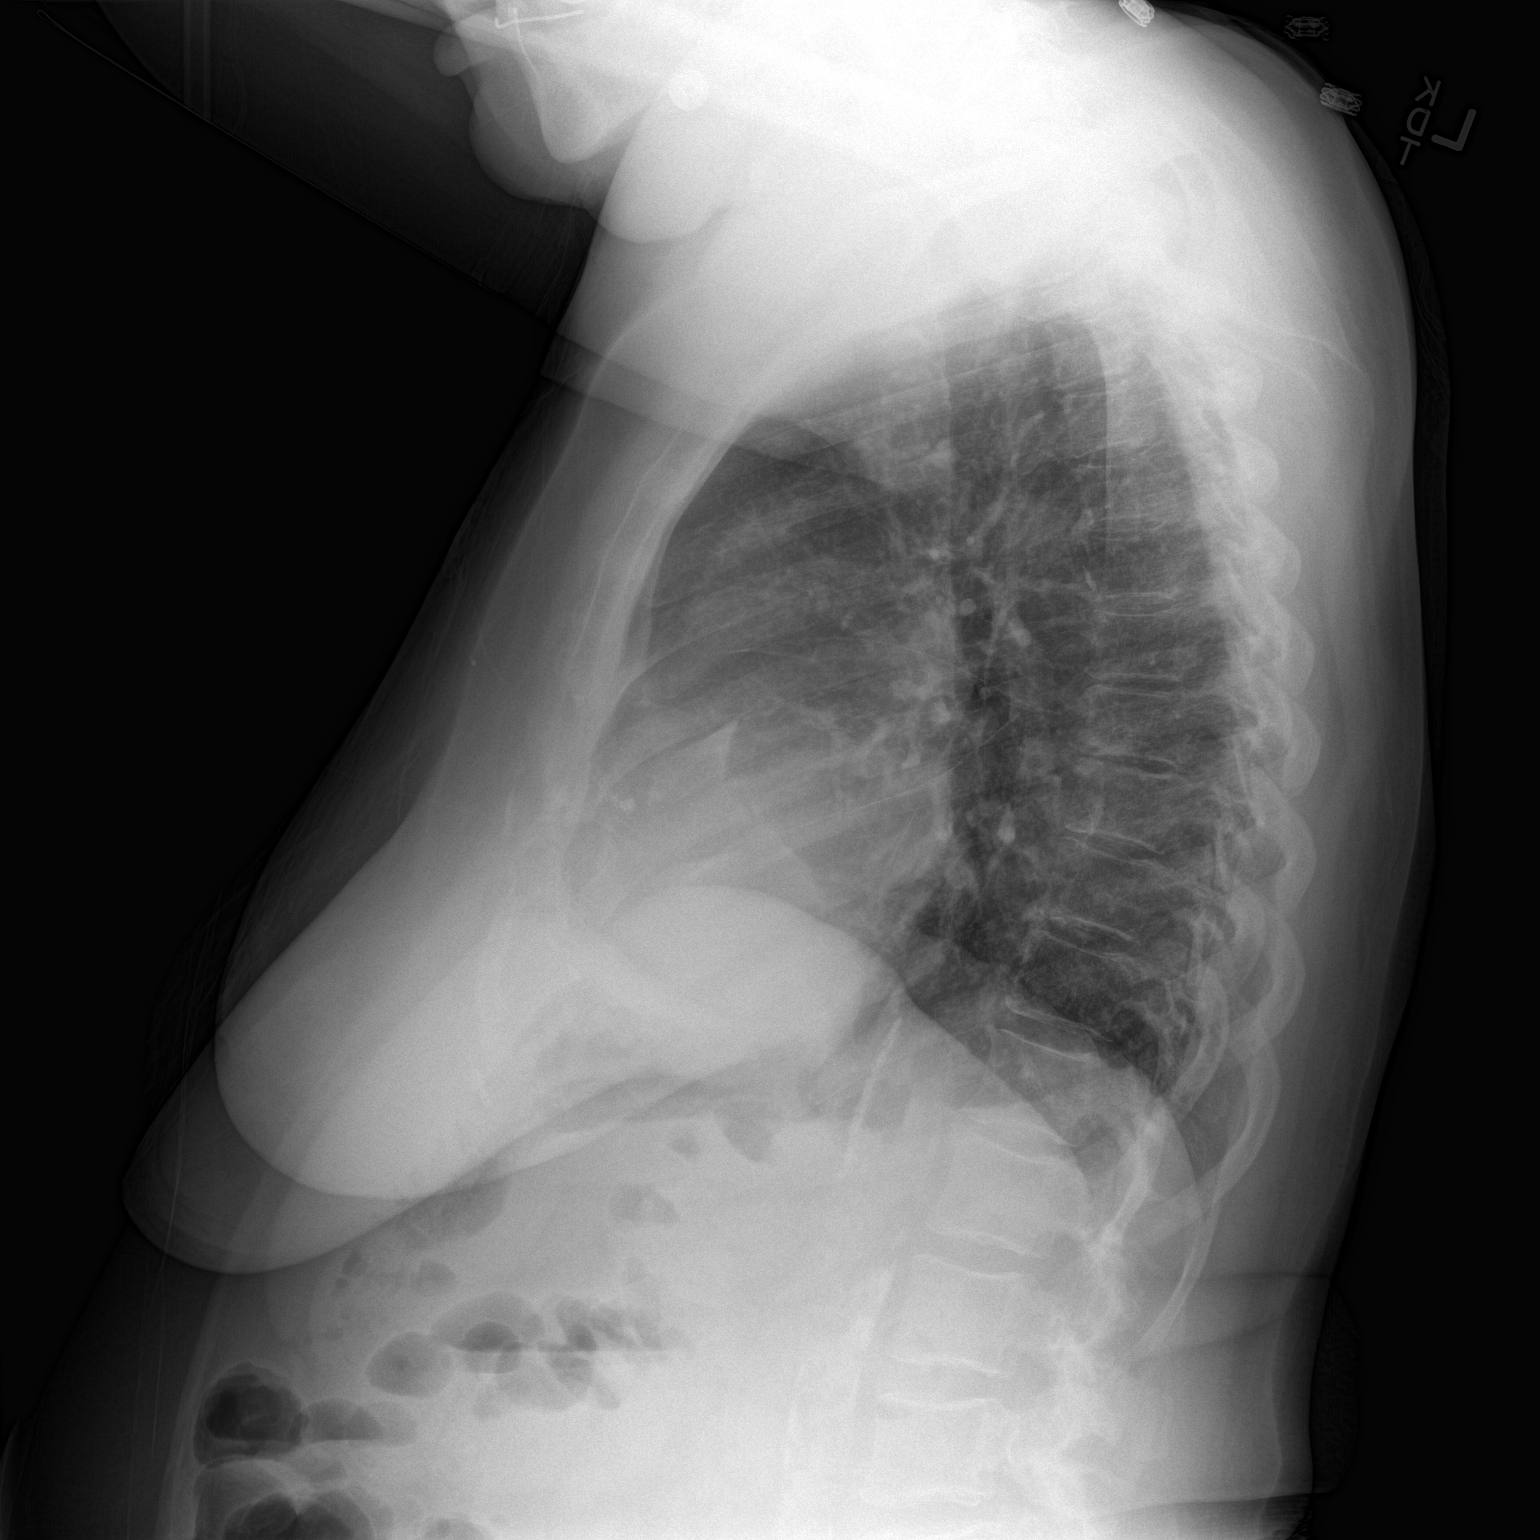

[2 of 2 positions shown; findings below may reference images not displayed]

FINDINGS: The heart size and mediastinal contours are within normal limits.
Both lungs are clear. The visualized skeletal structures are
unremarkable.
IMPRESSION: No active cardiopulmonary disease.

## 2017-03-22 DIAGNOSIS — M542 Cervicalgia: Secondary | ICD-10-CM | POA: Diagnosis not present

## 2017-03-22 DIAGNOSIS — G5603 Carpal tunnel syndrome, bilateral upper limbs: Secondary | ICD-10-CM | POA: Diagnosis not present

## 2017-04-02 DIAGNOSIS — E1122 Type 2 diabetes mellitus with diabetic chronic kidney disease: Secondary | ICD-10-CM | POA: Diagnosis not present

## 2017-04-02 DIAGNOSIS — I129 Hypertensive chronic kidney disease with stage 1 through stage 4 chronic kidney disease, or unspecified chronic kidney disease: Secondary | ICD-10-CM | POA: Diagnosis not present

## 2017-04-02 DIAGNOSIS — J45909 Unspecified asthma, uncomplicated: Secondary | ICD-10-CM | POA: Diagnosis not present

## 2017-04-02 DIAGNOSIS — I25118 Atherosclerotic heart disease of native coronary artery with other forms of angina pectoris: Secondary | ICD-10-CM | POA: Diagnosis not present

## 2017-04-02 DIAGNOSIS — N189 Chronic kidney disease, unspecified: Secondary | ICD-10-CM | POA: Diagnosis not present

## 2017-04-19 DIAGNOSIS — G5603 Carpal tunnel syndrome, bilateral upper limbs: Secondary | ICD-10-CM | POA: Diagnosis not present

## 2017-04-24 DIAGNOSIS — G5600 Carpal tunnel syndrome, unspecified upper limb: Secondary | ICD-10-CM | POA: Diagnosis not present

## 2017-04-24 DIAGNOSIS — M545 Low back pain: Secondary | ICD-10-CM | POA: Diagnosis not present

## 2017-04-24 DIAGNOSIS — G8929 Other chronic pain: Secondary | ICD-10-CM | POA: Diagnosis not present

## 2017-04-24 DIAGNOSIS — M5412 Radiculopathy, cervical region: Secondary | ICD-10-CM | POA: Diagnosis not present

## 2017-06-03 DIAGNOSIS — G5603 Carpal tunnel syndrome, bilateral upper limbs: Secondary | ICD-10-CM | POA: Diagnosis not present

## 2017-06-05 ENCOUNTER — Other Ambulatory Visit: Payer: Self-pay | Admitting: Orthopedic Surgery

## 2017-06-05 DIAGNOSIS — M542 Cervicalgia: Secondary | ICD-10-CM | POA: Diagnosis not present

## 2017-06-05 DIAGNOSIS — M545 Low back pain: Secondary | ICD-10-CM | POA: Diagnosis not present

## 2017-06-05 DIAGNOSIS — M5412 Radiculopathy, cervical region: Secondary | ICD-10-CM | POA: Diagnosis not present

## 2017-06-05 DIAGNOSIS — G8929 Other chronic pain: Secondary | ICD-10-CM | POA: Diagnosis not present

## 2017-06-06 DIAGNOSIS — E782 Mixed hyperlipidemia: Secondary | ICD-10-CM | POA: Diagnosis not present

## 2017-06-06 DIAGNOSIS — M130 Polyarthritis, unspecified: Secondary | ICD-10-CM | POA: Diagnosis not present

## 2017-06-06 DIAGNOSIS — I10 Essential (primary) hypertension: Secondary | ICD-10-CM | POA: Diagnosis not present

## 2017-06-06 DIAGNOSIS — E1140 Type 2 diabetes mellitus with diabetic neuropathy, unspecified: Secondary | ICD-10-CM | POA: Diagnosis not present

## 2017-06-11 DIAGNOSIS — E119 Type 2 diabetes mellitus without complications: Secondary | ICD-10-CM | POA: Diagnosis not present

## 2017-06-11 DIAGNOSIS — I10 Essential (primary) hypertension: Secondary | ICD-10-CM | POA: Diagnosis not present

## 2017-06-11 DIAGNOSIS — E785 Hyperlipidemia, unspecified: Secondary | ICD-10-CM | POA: Diagnosis not present

## 2017-06-17 ENCOUNTER — Other Ambulatory Visit: Payer: Self-pay | Admitting: Family Medicine

## 2017-06-17 DIAGNOSIS — Z1231 Encounter for screening mammogram for malignant neoplasm of breast: Secondary | ICD-10-CM

## 2017-06-26 DIAGNOSIS — M542 Cervicalgia: Secondary | ICD-10-CM | POA: Diagnosis not present

## 2017-06-28 ENCOUNTER — Encounter (HOSPITAL_BASED_OUTPATIENT_CLINIC_OR_DEPARTMENT_OTHER): Payer: Self-pay | Admitting: *Deleted

## 2017-06-28 ENCOUNTER — Other Ambulatory Visit: Payer: Self-pay

## 2017-06-28 NOTE — Progress Notes (Signed)
Pt has appointment scheduled for Dr. Terrence Dupont (cardiologist) for next Tuesday.  BMET and HgbA1C drawn today for pre visit. Spoke with office to have those labs faxed to [place].  No EKG noted in chart for past 12 months. Pt has transportation issues and will not be able to come for pre op visit here, request  faxed to Dr. Zenia Resides office for him to perform and fax over results on Tuesday of next week.  Pt very poor historian on phone interview.  Required lots of repetition.  Informed pt of need to call MD about ASA therapy. She agrees to call Harwani and make sure she can stop her ASA therapy starting tomorrow.

## 2017-06-28 NOTE — Progress Notes (Signed)
Reviewed chart with Dr. Kalman Shan, okay to proceed with surgery .

## 2017-07-02 DIAGNOSIS — N189 Chronic kidney disease, unspecified: Secondary | ICD-10-CM | POA: Diagnosis not present

## 2017-07-02 DIAGNOSIS — J45909 Unspecified asthma, uncomplicated: Secondary | ICD-10-CM | POA: Diagnosis not present

## 2017-07-02 DIAGNOSIS — I129 Hypertensive chronic kidney disease with stage 1 through stage 4 chronic kidney disease, or unspecified chronic kidney disease: Secondary | ICD-10-CM | POA: Diagnosis not present

## 2017-07-02 DIAGNOSIS — I25118 Atherosclerotic heart disease of native coronary artery with other forms of angina pectoris: Secondary | ICD-10-CM | POA: Diagnosis not present

## 2017-07-02 DIAGNOSIS — E1122 Type 2 diabetes mellitus with diabetic chronic kidney disease: Secondary | ICD-10-CM | POA: Diagnosis not present

## 2017-07-04 ENCOUNTER — Ambulatory Visit (HOSPITAL_BASED_OUTPATIENT_CLINIC_OR_DEPARTMENT_OTHER): Payer: Medicare Other | Admitting: Anesthesiology

## 2017-07-04 ENCOUNTER — Ambulatory Visit (HOSPITAL_BASED_OUTPATIENT_CLINIC_OR_DEPARTMENT_OTHER)
Admission: RE | Admit: 2017-07-04 | Discharge: 2017-07-04 | Disposition: A | Discharge status: Home / Self Care | Payer: Medicare Other | Source: Ambulatory Visit | Attending: Orthopedic Surgery | Admitting: Orthopedic Surgery

## 2017-07-04 ENCOUNTER — Encounter (HOSPITAL_BASED_OUTPATIENT_CLINIC_OR_DEPARTMENT_OTHER)
Admission: RE | Disposition: A | Discharge status: Home / Self Care | Payer: Self-pay | Source: Ambulatory Visit | Attending: Orthopedic Surgery

## 2017-07-04 ENCOUNTER — Encounter (HOSPITAL_BASED_OUTPATIENT_CLINIC_OR_DEPARTMENT_OTHER): Payer: Self-pay | Admitting: *Deleted

## 2017-07-04 DIAGNOSIS — Z87891 Personal history of nicotine dependence: Secondary | ICD-10-CM | POA: Insufficient documentation

## 2017-07-04 DIAGNOSIS — Z7982 Long term (current) use of aspirin: Secondary | ICD-10-CM | POA: Insufficient documentation

## 2017-07-04 DIAGNOSIS — K219 Gastro-esophageal reflux disease without esophagitis: Secondary | ICD-10-CM | POA: Insufficient documentation

## 2017-07-04 DIAGNOSIS — G5621 Lesion of ulnar nerve, right upper limb: Secondary | ICD-10-CM | POA: Diagnosis not present

## 2017-07-04 DIAGNOSIS — Z794 Long term (current) use of insulin: Secondary | ICD-10-CM | POA: Insufficient documentation

## 2017-07-04 DIAGNOSIS — M19041 Primary osteoarthritis, right hand: Secondary | ICD-10-CM | POA: Diagnosis not present

## 2017-07-04 DIAGNOSIS — Z7951 Long term (current) use of inhaled steroids: Secondary | ICD-10-CM | POA: Insufficient documentation

## 2017-07-04 DIAGNOSIS — E1169 Type 2 diabetes mellitus with other specified complication: Secondary | ICD-10-CM | POA: Insufficient documentation

## 2017-07-04 DIAGNOSIS — G5601 Carpal tunnel syndrome, right upper limb: Secondary | ICD-10-CM | POA: Diagnosis not present

## 2017-07-04 DIAGNOSIS — Z9049 Acquired absence of other specified parts of digestive tract: Secondary | ICD-10-CM | POA: Insufficient documentation

## 2017-07-04 DIAGNOSIS — E785 Hyperlipidemia, unspecified: Secondary | ICD-10-CM | POA: Insufficient documentation

## 2017-07-04 DIAGNOSIS — I25119 Atherosclerotic heart disease of native coronary artery with unspecified angina pectoris: Secondary | ICD-10-CM | POA: Diagnosis not present

## 2017-07-04 DIAGNOSIS — Z79899 Other long term (current) drug therapy: Secondary | ICD-10-CM | POA: Insufficient documentation

## 2017-07-04 DIAGNOSIS — I2510 Atherosclerotic heart disease of native coronary artery without angina pectoris: Secondary | ICD-10-CM | POA: Diagnosis not present

## 2017-07-04 DIAGNOSIS — J45909 Unspecified asthma, uncomplicated: Secondary | ICD-10-CM | POA: Diagnosis not present

## 2017-07-04 DIAGNOSIS — M19042 Primary osteoarthritis, left hand: Secondary | ICD-10-CM | POA: Insufficient documentation

## 2017-07-04 DIAGNOSIS — E1141 Type 2 diabetes mellitus with diabetic mononeuropathy: Secondary | ICD-10-CM | POA: Insufficient documentation

## 2017-07-04 DIAGNOSIS — Z955 Presence of coronary angioplasty implant and graft: Secondary | ICD-10-CM | POA: Insufficient documentation

## 2017-07-04 DIAGNOSIS — I10 Essential (primary) hypertension: Secondary | ICD-10-CM | POA: Diagnosis not present

## 2017-07-04 DIAGNOSIS — I252 Old myocardial infarction: Secondary | ICD-10-CM | POA: Insufficient documentation

## 2017-07-04 DIAGNOSIS — I219 Acute myocardial infarction, unspecified: Secondary | ICD-10-CM | POA: Diagnosis not present

## 2017-07-04 HISTORY — PX: CARPAL TUNNEL RELEASE: SHX101

## 2017-07-04 LAB — POCT I-STAT, CHEM 8
BUN: 14 mg/dL (ref 6–20)
BUN: 16 mg/dL (ref 6–20)
CALCIUM ION: 0.97 mmol/L — AB (ref 1.15–1.40)
CHLORIDE: 111 mmol/L (ref 101–111)
Calcium, Ion: 1.31 mmol/L (ref 1.15–1.40)
Chloride: 104 mmol/L (ref 101–111)
Creatinine, Ser: 1.6 mg/dL — ABNORMAL HIGH (ref 0.44–1.00)
Creatinine, Ser: 1.5 mg/dL — ABNORMAL HIGH (ref 0.44–1.00)
GLUCOSE: 70 mg/dL (ref 65–99)
GLUCOSE: 70 mg/dL (ref 65–99)
HCT: 38 % (ref 36.0–46.0)
HEMATOCRIT: 39 % (ref 36.0–46.0)
HEMOGLOBIN: 12.9 g/dL (ref 12.0–15.0)
HEMOGLOBIN: 13.3 g/dL (ref 12.0–15.0)
POTASSIUM: 4.9 mmol/L (ref 3.5–5.1)
POTASSIUM: 5.8 mmol/L — AB (ref 3.5–5.1)
Sodium: 139 mmol/L (ref 135–145)
Sodium: 141 mmol/L (ref 135–145)
TCO2: 20 mmol/L — ABNORMAL LOW (ref 22–32)
TCO2: 30 mmol/L (ref 22–32)

## 2017-07-04 LAB — RAPID URINE DRUG SCREEN, HOSP PERFORMED
Amphetamines: NOT DETECTED
Barbiturates: NOT DETECTED
Benzodiazepines: POSITIVE — AB
Cocaine: POSITIVE — AB
Opiates: NOT DETECTED
TETRAHYDROCANNABINOL: NOT DETECTED

## 2017-07-04 LAB — GLUCOSE, CAPILLARY: GLUCOSE-CAPILLARY: 75 mg/dL (ref 65–99)

## 2017-07-04 SURGERY — CARPAL TUNNEL RELEASE
Anesthesia: Monitor Anesthesia Care | Site: Wrist | Laterality: Right

## 2017-07-04 MED ORDER — CEFAZOLIN SODIUM-DEXTROSE 2-4 GM/100ML-% IV SOLN
2.0000 g | INTRAVENOUS | Status: AC
  Administered 2017-07-04: 2 g via INTRAVENOUS

## 2017-07-04 MED ORDER — FENTANYL CITRATE (PF) 100 MCG/2ML IJ SOLN
50.0000 ug | INTRAMUSCULAR | Status: DC | PRN
  Administered 2017-07-04 (×2): 25 ug via INTRAVENOUS

## 2017-07-04 MED ORDER — FENTANYL CITRATE (PF) 100 MCG/2ML IJ SOLN
INTRAMUSCULAR | Status: AC
  Filled 2017-07-04: qty 2

## 2017-07-04 MED ORDER — HYDROCODONE-ACETAMINOPHEN 5-325 MG PO TABS: ORAL_TABLET | ORAL | 0 refills | Status: DC

## 2017-07-04 MED ORDER — MIDAZOLAM HCL 2 MG/2ML IJ SOLN: 1.0000 mg | INTRAMUSCULAR | Status: DC | PRN

## 2017-07-04 MED ORDER — LACTATED RINGERS IV SOLN
INTRAVENOUS | Status: DC
  Administered 2017-07-04: 10:00:00 via INTRAVENOUS

## 2017-07-04 MED ORDER — METOCLOPRAMIDE HCL 5 MG/ML IJ SOLN: 10.0000 mg | Freq: Once | INTRAMUSCULAR | Status: DC | PRN

## 2017-07-04 MED ORDER — FENTANYL CITRATE (PF) 100 MCG/2ML IJ SOLN: 25.0000 ug | INTRAMUSCULAR | Status: DC | PRN

## 2017-07-04 MED ORDER — BUPIVACAINE HCL (PF) 0.25 % IJ SOLN
INTRAMUSCULAR | Status: DC | PRN
  Administered 2017-07-04: 8 mL

## 2017-07-04 MED ORDER — CEFAZOLIN SODIUM-DEXTROSE 2-4 GM/100ML-% IV SOLN
INTRAVENOUS | Status: AC
  Filled 2017-07-04: qty 100

## 2017-07-04 MED ORDER — PROPOFOL 10 MG/ML IV BOLUS
INTRAVENOUS | Status: DC | PRN
  Administered 2017-07-04 (×5): 10 mg via INTRAVENOUS

## 2017-07-04 MED ORDER — SCOPOLAMINE 1 MG/3DAYS TD PT72: 1.0000 | MEDICATED_PATCH | Freq: Once | TRANSDERMAL | Status: DC | PRN

## 2017-07-04 MED ORDER — MEPERIDINE HCL 25 MG/ML IJ SOLN: 6.2500 mg | INTRAMUSCULAR | Status: DC | PRN

## 2017-07-04 MED ORDER — HYDROCODONE-ACETAMINOPHEN 7.5-325 MG PO TABS: 1.0000 | ORAL_TABLET | Freq: Once | ORAL | Status: DC | PRN

## 2017-07-04 MED ORDER — ALBUTEROL SULFATE (2.5 MG/3ML) 0.083% IN NEBU
2.5000 mg | INHALATION_SOLUTION | Freq: Four times a day (QID) | RESPIRATORY_TRACT | Status: DC | PRN
  Administered 2017-07-04: 2.5 mg via RESPIRATORY_TRACT

## 2017-07-04 MED ORDER — LIDOCAINE HCL (PF) 0.5 % IJ SOLN
INTRAMUSCULAR | Status: DC | PRN
  Administered 2017-07-04: 35 mg via INTRAVENOUS

## 2017-07-04 MED ORDER — ALBUTEROL SULFATE (2.5 MG/3ML) 0.083% IN NEBU
INHALATION_SOLUTION | RESPIRATORY_TRACT | Status: AC
  Filled 2017-07-04: qty 3

## 2017-07-04 MED ORDER — CHLORHEXIDINE GLUCONATE 4 % EX LIQD: 60.0000 mL | Freq: Once | CUTANEOUS | Status: DC

## 2017-07-04 SURGICAL SUPPLY — 38 items
BANDAGE ACE 3X5.8 VEL STRL LF (GAUZE/BANDAGES/DRESSINGS) ×3 IMPLANT
BLADE SURG 15 STRL LF DISP TIS (BLADE) ×2 IMPLANT
BLADE SURG 15 STRL SS (BLADE) ×6
BNDG CMPR 9X4 STRL LF SNTH (GAUZE/BANDAGES/DRESSINGS)
BNDG ESMARK 4X9 LF (GAUZE/BANDAGES/DRESSINGS) IMPLANT
BNDG GAUZE ELAST 4 BULKY (GAUZE/BANDAGES/DRESSINGS) ×3 IMPLANT
CHLORAPREP W/TINT 26ML (MISCELLANEOUS) ×3 IMPLANT
CORD BIPOLAR FORCEPS 12FT (ELECTRODE) ×3 IMPLANT
COVER BACK TABLE 60X90IN (DRAPES) ×3 IMPLANT
COVER MAYO STAND STRL (DRAPES) ×3 IMPLANT
CUFF TOURNIQUET SINGLE 18IN (TOURNIQUET CUFF) ×3 IMPLANT
DRAPE EXTREMITY T 121X128X90 (DRAPE) ×3 IMPLANT
DRAPE SURG 17X23 STRL (DRAPES) ×3 IMPLANT
DRSG PAD ABDOMINAL 8X10 ST (GAUZE/BANDAGES/DRESSINGS) ×3 IMPLANT
GAUZE SPONGE 4X4 12PLY STRL (GAUZE/BANDAGES/DRESSINGS) ×3 IMPLANT
GAUZE XEROFORM 1X8 LF (GAUZE/BANDAGES/DRESSINGS) ×3 IMPLANT
GLOVE BIO SURGEON STRL SZ 6.5 (GLOVE) ×1 IMPLANT
GLOVE BIO SURGEON STRL SZ7.5 (GLOVE) ×3 IMPLANT
GLOVE BIO SURGEONS STRL SZ 6.5 (GLOVE) ×1
GLOVE BIOGEL PI IND STRL 7.0 (GLOVE) IMPLANT
GLOVE BIOGEL PI IND STRL 8 (GLOVE) ×1 IMPLANT
GLOVE BIOGEL PI INDICATOR 7.0 (GLOVE) ×2
GLOVE BIOGEL PI INDICATOR 8 (GLOVE) ×2
GOWN STRL REUS W/ TWL LRG LVL3 (GOWN DISPOSABLE) ×1 IMPLANT
GOWN STRL REUS W/TWL LRG LVL3 (GOWN DISPOSABLE) ×3
GOWN STRL REUS W/TWL XL LVL3 (GOWN DISPOSABLE) ×3 IMPLANT
NDL HYPO 25X1 1.5 SAFETY (NEEDLE) ×1 IMPLANT
NEEDLE HYPO 25X1 1.5 SAFETY (NEEDLE) ×3 IMPLANT
NS IRRIG 1000ML POUR BTL (IV SOLUTION) ×3 IMPLANT
PACK BASIN DAY SURGERY FS (CUSTOM PROCEDURE TRAY) ×3 IMPLANT
PADDING CAST ABS 4INX4YD NS (CAST SUPPLIES) ×2
PADDING CAST ABS COTTON 4X4 ST (CAST SUPPLIES) ×1 IMPLANT
STOCKINETTE 4X48 STRL (DRAPES) ×3 IMPLANT
SUT ETHILON 4 0 PS 2 18 (SUTURE) ×3 IMPLANT
SYR BULB 3OZ (MISCELLANEOUS) ×3 IMPLANT
SYR CONTROL 10ML LL (SYRINGE) ×3 IMPLANT
TOWEL GREEN STERILE FF (TOWEL DISPOSABLE) ×6 IMPLANT
UNDERPAD 30X30 (UNDERPADS AND DIAPERS) ×3 IMPLANT

## 2017-07-04 NOTE — Anesthesia Procedure Notes (Signed)
Procedure Name: MAC Date/Time: 07/04/2017 10:06 AM Performed by: Lieutenant Diego, CRNA Pre-anesthesia Checklist: Patient identified, Timeout performed, Emergency Drugs available, Suction available and Patient being monitored Patient Re-evaluated:Patient Re-evaluated prior to induction Oxygen Delivery Method: Simple face mask Preoxygenation: Pre-oxygenation with 100% oxygen

## 2017-07-04 NOTE — Discharge Instructions (Addendum)
  Post Anesthesia Home Care Instructions  Activity: Get plenty of rest for the remainder of the day. A responsible individual must stay with you for 24 hours following the procedure.  For the next 24 hours, DO NOT: -Drive a car -Operate machinery -Drink alcoholic beverages -Take any medication unless instructed by your physician -Make any legal decisions or sign important papers.  Meals: Start with liquid foods such as gelatin or soup. Progress to regular foods as tolerated. Avoid greasy, spicy, heavy foods. If nausea and/or vomiting occur, drink only clear liquids until the nausea and/or vomiting subsides. Call your physician if vomiting continues.  Special Instructions/Symptoms: Your throat may feel dry or sore from the anesthesia or the breathing tube placed in your throat during surgery. If this causes discomfort, gargle with warm salt water. The discomfort should disappear within 24 hours.  If you had a scopolamine patch placed behind your ear for the management of post- operative nausea and/or vomiting:  1. The medication in the patch is effective for 72 hours, after which it should be removed.  Wrap patch in a tissue and discard in the trash. Wash hands thoroughly with soap and water. 2. You may remove the patch earlier than 72 hours if you experience unpleasant side effects which may include dry mouth, dizziness or visual disturbances. 3. Avoid touching the patch. Wash your hands with soap and water after contact with the patch.    [facility] Instructions Hand Surgery  Wound Care: Keep your hand elevated above the level of your heart.  Do not allow it to dangle by your side.  Keep the dressing dry and do not remove it unless your doctor advises you to do so.  He will usually change it at the time of your post-op visit.  Moving your fingers is advised to stimulate circulation but will depend on the site of your surgery.  If you have a splint applied, your doctor will advise you  regarding movement.  Activity: Do not drive or operate machinery today.  Rest today and then you may return to your normal activity and work as indicated by your physician.  Diet:  Drink liquids today or eat a light diet.  You may resume a regular diet tomorrow.    General expectations: Pain for two to three days. Fingers may become slightly swollen.  Call your doctor if any of the following occur: Severe pain not relieved by pain medication. Elevated temperature. Dressing soaked with blood. Inability to move fingers. White or bluish color to fingers.  

## 2017-07-04 NOTE — Transfer of Care (Signed)
Immediate Anesthesia Transfer of Care Note  Patient: [name]  Procedure(s) Performed: RIGHT CARPAL TUNNEL RELEASE (Right Wrist)  Patient Location: PACU  Anesthesia Type:MAC  Level of Consciousness: awake  Airway & Oxygen Therapy: Patient Spontanous Breathing and Patient connected to face mask oxygen  Post-op Assessment: Report given to RN and Post -op Vital signs reviewed and stable  Post vital signs: Reviewed and stable  Last Vitals:  Vitals Value Taken Time  BP 96/35 07/04/2017 11:15 AM  Temp    Pulse 78 07/04/2017 11:22 AM  Resp 26 07/04/2017 11:22 AM  SpO2 100 % 07/04/2017 11:22 AM  Vitals shown include unvalidated device data.  Last Pain:  Vitals:   07/04/17 1100  TempSrc:   PainSc: 0-No pain      Patients Stated Pain Goal: 4 (81/82/99 [id number])  Complications: No apparent anesthesia complications

## 2017-07-04 NOTE — Op Note (Signed)
07/04/2017 [facility]                              OPERATIVE REPORT   PREOPERATIVE DIAGNOSIS:  Right carpal tunnel syndrome.  POSTOPERATIVE DIAGNOSIS:  Right carpal tunnel syndrome.  PROCEDURE:  Right carpal tunnel release.  SURGEON:  Leanora Cover, MD  ASSISTANT:  none.  ANESTHESIA:  Bier block and sedation.  IV FLUIDS:  Per anesthesia flow sheet.  ESTIMATED BLOOD LOSS:  Minimal.  COMPLICATIONS:  None.  SPECIMENS:  None.  TOURNIQUET TIME:    Total Tourniquet Time Documented: Forearm (Right) - 28 minutes Total: Forearm (Right) - 28 minutes   DISPOSITION:  Stable to PACU.  LOCATION: [facility]  INDICATIONS:  67 yo female with numbness in right hand.  Positive nerve conduction studies.  Non operative measures have no provided relief.  She wishes to have a carpal tunnel release for management of her symptoms.  Risks, benefits and alternatives of surgery were discussed including the risk of blood loss; infection; damage to nerves, vessels, tendons, ligaments, bone; failure of surgery; need for additional surgery; complications with wound healing; continued pain; recurrence of carpal tunnel syndrome; and damage to motor branch. She voiced understanding of these risks and elected to proceed.   OPERATIVE COURSE:  After being identified preoperatively by myself, the patient and I agreed upon the procedure and site of procedure.  The surgical site was marked.  The risks, benefits, and alternatives of the surgery were reviewed and she wished to proceed.  Surgical consent had been signed.  She was given IV Ancef as preoperative antibiotic prophylaxis.  She was transferred to the operating room and placed on the operating room table in supine position with the Right upper extremity on an armboard.  Bier block and sedation was induced by Anesthesiology.  Right upper extremity was prepped and draped in normal sterile orthopaedic fashion.  A surgical pause was  performed between the surgeons, anesthesia, and operating room staff, and all were in agreement as to the patient, procedure, and site of procedure.  Tourniquet at the proximal aspect of the forearm had been inflated for the Bier block.  Incision was made over the transverse carpal ligament and carried into the subcutaneous tissues by spreading technique.  Bipolar electrocautery was used to obtain hemostasis.  The palmar fascia was sharply incised.  The transverse carpal ligament was identified and sharply incised.  It was incised distally first.  Care was taken to ensure complete decompression distally.  It was then incised proximally.  Scissors were used to split the distal aspect of the volar antebrachial fascia.  A finger was placed into the wound to ensure complete decompression, which was the case.  The nerve was examined.  It was adherent to the radial leaflet.  The motor branch was identified and was intact.  The wound was copiously irrigated with sterile saline.  It was then closed with 4-0 nylon in a horizontal mattress fashion.  It was injected with 0.25% plain Marcaine to aid in postoperative analgesia.  It was dressed with sterile Xeroform, 4x4s, an ABD, and wrapped with Kerlix and an Ace bandage.  Tourniquet was deflated at 28 minutes.  Fingertips were pink with brisk capillary refill after deflation of the tourniquet.  Operative drapes were broken down.  The patient was awoken from anesthesia safely.  She was transferred back to stretcher and taken to the PACU in stable condition.  I will see her back in the office in 1 week for postoperative followup.  I will give her a prescription for norco 5/325 1-2 tabs PO q6 hours prn pain, dispense #20.    Tennis Must, MD Electronically signed, 07/04/17

## 2017-07-04 NOTE — Anesthesia Procedure Notes (Signed)
Anesthesia Regional Block: Bier block (IV Regional)   Pre-Anesthetic Checklist: ,, timeout performed, Correct Patient, Correct Site, Correct Laterality, Correct Procedure,, site marked, surgical consent,, at surgeon's request Needles:  Injection technique: Single-shot  Needle Type: Other      Needle Gauge: 22     Additional Needles:   Procedures:,,,,, intact distal pulses, Esmarch exsanguination, single tourniquet utilized,  Narrative:  Start time: 07/04/2017 9:55 AM End time: 07/04/2017 9:55 AM Injection made incrementally with aspirations every 35 mL.  Performed by: Personally   Additional Notes: Esmark wrap, torq inflated 290, neg pulse, slowly injected 0.5% pres free lidocaine, pt tol well

## 2017-07-04 NOTE — Anesthesia Postprocedure Evaluation (Signed)
Anesthesia Post Note  Patient: [name]  Procedure(s) Performed: RIGHT CARPAL TUNNEL RELEASE (Right Wrist)     Patient location during evaluation: PACU Anesthesia Type: Bier Block Level of consciousness: awake and alert and oriented Pain management: pain level controlled Vital Signs Assessment: post-procedure vital signs reviewed and stable Respiratory status: spontaneous breathing, nonlabored ventilation and respiratory function stable Cardiovascular status: stable and blood pressure returned to baseline Postop Assessment: no apparent nausea or vomiting Anesthetic complications: no    Last Vitals:  Vitals:   07/04/17 1030 07/04/17 1046  BP: (!) 115/59 128/87  Pulse: 73 70  Resp: 13 16  Temp: 36.7 C   SpO2: 100% 100%    Last Pain:  Vitals:   07/04/17 1030  TempSrc:   PainSc: 0-No pain                 Audie Stayer A.

## 2017-07-04 NOTE — H&P (Signed)
[name] is an 67 y.o. female.   Chief Complaint: carpal tunnel syndrome HPI: 67 yo female with numbness in right hand.  Positive nerve conduction studies.  Non operative measures have been ineffective at providing lasting relief.  She wishes to have a carpal tunnel release on the right.  Allergies: No Known Allergies  Past Medical History:  Diagnosis Date  . Angina   . Arthritis    HANDS"  . Asthma   . Carpal tunnel syndrome, bilateral 11/26/2016  . Cholelithiasis   . Cocaine abuse ([place]) 07/2012   per E.R. drug screen  . Coronary artery disease   . Diabetes mellitus without mention of complication    type 2  . Dysphagia    esophageal dysmotility on 03/2011 esophagram   . Fatty liver disease, nonalcoholic 2119   on Imaging.   Marland Kitchen GERD (gastroesophageal reflux disease)   . History of colonic polyps 2009   adenomatous 2009, HP in 2009, 2011, 2013.   Marland Kitchen Hyperlipidemia   . Hypertension   . Iron deficiency anemia   . Myocardial infarction ([place])    years ago, 6 stents placed  . 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 ABDOMINAL ANGIOGRAM  02/20/2011   Procedure: ABDOMINAL ANGIOGRAM;  Surgeon: Clent Demark, MD;  Location: [facility] CATH LAB;  Service: Cardiovascular;;  . COLONOSCOPY  11/30/2011   Procedure: COLONOSCOPY;  Surgeon: Lafayette Dragon, MD;  Location: [facility] ENDOSCOPY;  Service: Endoscopy;  Laterality: N/A;  . CORONARY ANGIOPLASTY WITH STENT PLACEMENT    . ESOPHAGOGASTRODUODENOSCOPY  11/30/2011   Procedure: ESOPHAGOGASTRODUODENOSCOPY (EGD);  Surgeon: Lafayette Dragon, MD;  Location: [facility] ENDOSCOPY;  Service: Endoscopy;  Laterality: N/A;  . ESOPHAGOGASTRODUODENOSCOPY N/A 02/23/2015   Procedure: ESOPHAGOGASTRODUODENOSCOPY (EGD);  Surgeon: Gatha Mayer, MD;  Location: [facility] ENDOSCOPY;  Service: Endoscopy;  Laterality: N/A;  . LEFT HEART CATHETERIZATION WITH CORONARY ANGIOGRAM N/A 02/20/2011   Procedure: LEFT HEART  CATHETERIZATION WITH CORONARY ANGIOGRAM;  Surgeon: Clent Demark, MD;  Location: [facility] CATH LAB;  Service: Cardiovascular;  Laterality: N/A;  . RIGHT COLECTOMY  02/2007   for post polypectomy colonic perforation  . TUBAL LIGATION      Family History: Family History  Problem Relation Age of Onset  . Heart disease Mother   . Diabetes Mother   . Cancer Father        unsure what kind  . Diabetes Sister   . Colon cancer Neg Hx     Social History:   reports that she quit smoking about 7 years ago. Her smoking use included cigarettes. She has a 23.50 pack-year smoking history. She has never used smokeless tobacco. She reports that she has current or past drug history. Drug: Cocaine. She reports that she does not drink alcohol.  Medications: Medications Prior to Admission  Medication Sig Dispense Refill  . albuterol (PROVENTIL) (2.5 MG/3ML) 0.083% nebulizer solution Take 2.5 mg by nebulization 2 (two) times daily.    Marland Kitchen ALPRAZolam (XANAX) 0.25 MG tablet Take 0.25 mg by mouth 2 (two) times daily.     Marland Kitchen amLODipine (NORVASC) 5 MG tablet Take 5 mg by mouth daily.    Marland Kitchen aspirin EC 81 MG EC tablet Take 1 tablet (81 mg total) by mouth daily. 30 tablet 0  . atorvastatin (LIPITOR) 20 MG tablet Take 20 mg by mouth daily.    . budesonide-formoterol (SYMBICORT) 160-4.5 MCG/ACT inhaler Inhale 2 puffs into the lungs 2 (two)  times daily.    . cyclobenzaprine (FLEXERIL) 10 MG tablet Take 1 tablet (10 mg total) by mouth 2 (two) times daily as needed for muscle spasms. 20 tablet 0  . feeding supplement, ENSURE ENLIVE, (ENSURE ENLIVE) LIQD Take 237 mLs by mouth 2 (two) times daily between meals. 60 Bottle 0  . Insulin Glargine (LANTUS SOLOSTAR) 100 UNIT/ML Solostar Pen Inject 20 Units into the skin daily at 10 pm. (Patient taking differently: Inject 40 Units into the skin 1 day or 1 dose. ) 15 mL 0  . loperamide (IMODIUM) 2 MG capsule Take 1 capsule (2 mg total) by mouth 4 (four) times daily as needed for diarrhea  or loose stools. 12 capsule 0  . metFORMIN (GLUCOPHAGE) 1000 MG tablet Take 500 mg by mouth 2 (two) times daily with a meal.     . metoprolol succinate (TOPROL-XL) 50 MG 24 hr tablet Take 50 mg by mouth every evening. Take with or immediately following a meal.    . nitroGLYCERIN (NITROSTAT) 0.4 MG SL tablet Place 1 tablet (0.4 mg total) under the tongue every 5 (five) minutes as needed. For chest pain (Patient taking differently: Place 0.4 mg under the tongue every 5 (five) minutes as needed for chest pain. ) 10 tablet 0  . pantoprazole (PROTONIX) 40 MG tablet Take 1 tablet (40 mg total) by mouth daily. 60 tablet 0  . dicyclomine (BENTYL) 20 MG tablet Take 1 tablet (20 mg total) by mouth 2 (two) times daily. 20 tablet 0  . DULoxetine (CYMBALTA) 30 MG capsule Take 1 capsule (30 mg total) by mouth daily. (Patient not taking: Reported on 11/27/2015) 30 capsule 0  . sucralfate (CARAFATE) 1 GM/10ML suspension Take 10 mLs (1 g total) by mouth 4 (four) times daily -  with meals and at bedtime. 420 mL 0    Results for orders placed or performed during the hospital encounter of 07/04/17 (from the past 48 hour(s))  I-STAT, chem 8     Status: Abnormal   Collection Time: 07/04/17  9:32 AM  Result Value Ref Range   Sodium 141 135 - 145 mmol/L   Potassium 5.8 (H) 3.5 - 5.1 mmol/L   Chloride 104 101 - 111 mmol/L   BUN 16 6 - 20 mg/dL   Creatinine, Ser 1.50 (H) 0.44 - 1.00 mg/dL   Glucose, Bld 70 65 - 99 mg/dL   Calcium, Ion 1.31 1.15 - 1.40 mmol/L   TCO2 30 22 - 32 mmol/L   Hemoglobin 13.3 12.0 - 15.0 g/dL   HCT 39.0 36.0 - 46.0 %    No results found.   A comprehensive review of systems was negative except for: Constitutional: positive for chills Respiratory: positive for shortness of breath Gastrointestinal: positive for diarrhea and vomiting Hematologic/lymphatic: positive for easy bruising Musculoskeletal: positive for neck pain Neurological: positive for dizziness, headaches and  paresthesia  Blood pressure (!) 145/82, pulse 78, temperature 97.8 F (36.6 C), temperature source Oral, resp. rate 16, height 4\' 11"  (1.499 m), weight 58.8 kg (129 lb 9.6 oz), SpO2 99 %.  General appearance: alert, cooperative and appears stated age Head: Normocephalic, without obvious abnormality, atraumatic Neck: supple, symmetrical, trachea midline Cardio: regular rate and rhythm Resp: clear to auscultation bilaterally Extremities: Intact sensation and capillary refill all digits.  +epl/fpl/io.  No wounds.  Pulses: 2+ and symmetric Skin: Skin color, texture, turgor normal. No rashes or lesions Neurologic: Grossly normal Incision/Wound: none  Assessment/Plan Right carpal tunnel syndrome.  Non operative and operative treatment  options were discussed with the patient and patient wishes to proceed with operative treatment. Risks, benefits, and alternatives of surgery were discussed and the patient agrees with the plan of care.   Van Seymore R 07/04/2017, 9:44 AM

## 2017-07-04 NOTE — Progress Notes (Signed)
Dr. Royce Macadamia notified that patient took 1 dose of nitroglycerin for chest pain approximately two weeks ago for chest pain. Patient stated that chest pain was relieved after taking nitroglycerin and resting.

## 2017-07-04 NOTE — Anesthesia Preprocedure Evaluation (Signed)
Anesthesia Evaluation  Patient identified by MRN, date of birth, ID band Patient awake    Reviewed: Allergy & Precautions, NPO status , Patient's Chart, lab work & pertinent test results  Airway Mallampati: II  TM Distance: >3 FB Neck ROM: Full    Dental  (+) Edentulous Upper, Poor Dentition, Missing   Pulmonary asthma , former smoker,    Pulmonary exam normal breath sounds clear to auscultation       Cardiovascular hypertension, Pt. on medications + angina with exertion + CAD, + Past MI, + Cardiac Stents and + DOE  Normal cardiovascular exam Rhythm:Regular Rate:Normal     Neuro/Psych PSYCHIATRIC DISORDERS Depression Right Carpal tunnel syndrome  Neuromuscular disease    GI/Hepatic GERD  Medicated and Controlled,(+)     substance abuse  cocaine use, Denies recent use   Endo/Other  diabetes, Well Controlled, Type 2, Oral Hypoglycemic AgentsHyperlipidemia  Renal/GU Renal InsufficiencyRenal disease  negative genitourinary   Musculoskeletal  (+) Arthritis ,   Abdominal   Peds  Hematology  (+) anemia ,   Anesthesia Other Findings   Reproductive/Obstetrics                             Anesthesia Physical Anesthesia Plan  ASA: III  Anesthesia Plan: Bier Block and Bier Block-LIDOCAINE ONLY   Post-op Pain Management:    Induction: Intravenous  PONV Risk Score and Plan: 2 and Ondansetron, Propofol infusion and Treatment may vary due to age or medical condition  Airway Management Planned: Natural Airway and Simple Face Mask  Additional Equipment:   Intra-op Plan:   Post-operative Plan:   Informed Consent: I have reviewed the patients History and Physical, chart, labs and discussed the procedure including the risks, benefits and alternatives for the proposed anesthesia with the patient or authorized representative who has indicated his/her understanding and acceptance.     Plan  Discussed with: CRNA and Surgeon  Anesthesia Plan Comments:         Anesthesia Quick Evaluation

## 2017-07-05 ENCOUNTER — Encounter (HOSPITAL_BASED_OUTPATIENT_CLINIC_OR_DEPARTMENT_OTHER): Payer: Self-pay | Admitting: Orthopedic Surgery

## 2017-07-25 DIAGNOSIS — G8929 Other chronic pain: Secondary | ICD-10-CM | POA: Diagnosis not present

## 2017-07-25 DIAGNOSIS — M5412 Radiculopathy, cervical region: Secondary | ICD-10-CM | POA: Diagnosis not present

## 2017-07-25 DIAGNOSIS — M48061 Spinal stenosis, lumbar region without neurogenic claudication: Secondary | ICD-10-CM | POA: Diagnosis not present

## 2017-07-25 DIAGNOSIS — E119 Type 2 diabetes mellitus without complications: Secondary | ICD-10-CM | POA: Diagnosis not present

## 2017-07-25 DIAGNOSIS — M545 Low back pain: Secondary | ICD-10-CM | POA: Diagnosis not present

## 2017-07-25 DIAGNOSIS — M542 Cervicalgia: Secondary | ICD-10-CM | POA: Diagnosis not present

## 2017-07-25 DIAGNOSIS — M50123 Cervical disc disorder at C6-C7 level with radiculopathy: Secondary | ICD-10-CM | POA: Diagnosis not present

## 2017-08-02 ENCOUNTER — Ambulatory Visit
Admission: RE | Admit: 2017-08-02 | Discharge: 2017-08-02 | Disposition: A | Discharge status: Home / Self Care | Payer: Medicare Other | Source: Ambulatory Visit | Attending: Family Medicine | Admitting: Family Medicine

## 2017-08-02 DIAGNOSIS — Z1231 Encounter for screening mammogram for malignant neoplasm of breast: Secondary | ICD-10-CM | POA: Diagnosis not present

## 2017-08-05 ENCOUNTER — Other Ambulatory Visit: Payer: Self-pay | Admitting: Family Medicine

## 2017-08-05 DIAGNOSIS — R928 Other abnormal and inconclusive findings on diagnostic imaging of breast: Secondary | ICD-10-CM

## 2017-08-09 ENCOUNTER — Other Ambulatory Visit: Payer: Self-pay | Admitting: Orthopedic Surgery

## 2017-08-26 ENCOUNTER — Ambulatory Visit
Admission: RE | Admit: 2017-08-26 | Discharge: 2017-08-26 | Disposition: A | Discharge status: Home / Self Care | Payer: Medicare Other | Source: Ambulatory Visit | Attending: Family Medicine | Admitting: Family Medicine

## 2017-08-26 ENCOUNTER — Other Ambulatory Visit: Payer: Self-pay | Admitting: Family Medicine

## 2017-08-26 DIAGNOSIS — R928 Other abnormal and inconclusive findings on diagnostic imaging of breast: Secondary | ICD-10-CM

## 2017-08-26 DIAGNOSIS — R921 Mammographic calcification found on diagnostic imaging of breast: Secondary | ICD-10-CM | POA: Diagnosis not present

## 2017-08-26 DIAGNOSIS — N6310 Unspecified lump in the right breast, unspecified quadrant: Secondary | ICD-10-CM | POA: Diagnosis not present

## 2017-09-03 ENCOUNTER — Ambulatory Visit
Admission: RE | Admit: 2017-09-03 | Discharge: 2017-09-03 | Disposition: A | Discharge status: Home / Self Care | Payer: Medicare Other | Source: Ambulatory Visit | Attending: Family Medicine | Admitting: Family Medicine

## 2017-09-03 ENCOUNTER — Other Ambulatory Visit: Payer: Self-pay | Admitting: Family Medicine

## 2017-09-03 DIAGNOSIS — R928 Other abnormal and inconclusive findings on diagnostic imaging of breast: Secondary | ICD-10-CM

## 2017-09-03 DIAGNOSIS — C50411 Malignant neoplasm of upper-outer quadrant of right female breast: Secondary | ICD-10-CM | POA: Diagnosis not present

## 2017-09-03 DIAGNOSIS — R921 Mammographic calcification found on diagnostic imaging of breast: Secondary | ICD-10-CM | POA: Diagnosis not present

## 2017-09-03 DIAGNOSIS — D0511 Intraductal carcinoma in situ of right breast: Secondary | ICD-10-CM | POA: Diagnosis not present

## 2017-09-03 DIAGNOSIS — N6311 Unspecified lump in the right breast, upper outer quadrant: Secondary | ICD-10-CM | POA: Diagnosis not present

## 2017-09-03 DIAGNOSIS — R590 Localized enlarged lymph nodes: Secondary | ICD-10-CM | POA: Diagnosis not present

## 2017-09-04 ENCOUNTER — Encounter (HOSPITAL_BASED_OUTPATIENT_CLINIC_OR_DEPARTMENT_OTHER): Payer: Self-pay | Admitting: *Deleted

## 2017-09-04 ENCOUNTER — Other Ambulatory Visit: Payer: Self-pay

## 2017-09-04 DIAGNOSIS — C801 Malignant (primary) neoplasm, unspecified: Secondary | ICD-10-CM

## 2017-09-04 HISTORY — DX: Malignant (primary) neoplasm, unspecified: C80.1

## 2017-09-04 NOTE — Progress Notes (Signed)
Patient stated she has just returned from the [place] with a diagnosis of right breast cancer.  States she wants to proceed with carpal tunnel surgery as scheduled.  She has to find someone to come with her the day of surgery and does not know at this time who it will be.  Advised her if she changes her mind about surgery or is unable to find someone to come with her (she will be taking a cab) to call Dr. Levell July office.

## 2017-09-12 ENCOUNTER — Ambulatory Visit (HOSPITAL_BASED_OUTPATIENT_CLINIC_OR_DEPARTMENT_OTHER): Payer: Medicare Other | Admitting: Anesthesiology

## 2017-09-12 ENCOUNTER — Encounter (HOSPITAL_BASED_OUTPATIENT_CLINIC_OR_DEPARTMENT_OTHER)
Admission: RE | Disposition: A | Discharge status: Home / Self Care | Payer: Self-pay | Source: Ambulatory Visit | Attending: Orthopedic Surgery

## 2017-09-12 ENCOUNTER — Other Ambulatory Visit: Payer: Self-pay

## 2017-09-12 ENCOUNTER — Ambulatory Visit (HOSPITAL_BASED_OUTPATIENT_CLINIC_OR_DEPARTMENT_OTHER)
Admission: RE | Admit: 2017-09-12 | Discharge: 2017-09-12 | Disposition: A | Discharge status: Home / Self Care | Payer: Medicare Other | Source: Ambulatory Visit | Attending: Orthopedic Surgery | Admitting: Orthopedic Surgery

## 2017-09-12 ENCOUNTER — Encounter (HOSPITAL_BASED_OUTPATIENT_CLINIC_OR_DEPARTMENT_OTHER): Payer: Self-pay | Admitting: Anesthesiology

## 2017-09-12 DIAGNOSIS — K219 Gastro-esophageal reflux disease without esophagitis: Secondary | ICD-10-CM | POA: Diagnosis not present

## 2017-09-12 DIAGNOSIS — Z955 Presence of coronary angioplasty implant and graft: Secondary | ICD-10-CM | POA: Diagnosis not present

## 2017-09-12 DIAGNOSIS — Z794 Long term (current) use of insulin: Secondary | ICD-10-CM | POA: Diagnosis not present

## 2017-09-12 DIAGNOSIS — G5602 Carpal tunnel syndrome, left upper limb: Secondary | ICD-10-CM | POA: Diagnosis not present

## 2017-09-12 DIAGNOSIS — M19042 Primary osteoarthritis, left hand: Secondary | ICD-10-CM | POA: Insufficient documentation

## 2017-09-12 DIAGNOSIS — Z86010 Personal history of colonic polyps: Secondary | ICD-10-CM | POA: Insufficient documentation

## 2017-09-12 DIAGNOSIS — E119 Type 2 diabetes mellitus without complications: Secondary | ICD-10-CM | POA: Diagnosis not present

## 2017-09-12 DIAGNOSIS — J45909 Unspecified asthma, uncomplicated: Secondary | ICD-10-CM | POA: Insufficient documentation

## 2017-09-12 DIAGNOSIS — Z853 Personal history of malignant neoplasm of breast: Secondary | ICD-10-CM | POA: Diagnosis not present

## 2017-09-12 DIAGNOSIS — I252 Old myocardial infarction: Secondary | ICD-10-CM | POA: Diagnosis not present

## 2017-09-12 DIAGNOSIS — I10 Essential (primary) hypertension: Secondary | ICD-10-CM | POA: Diagnosis not present

## 2017-09-12 DIAGNOSIS — I2510 Atherosclerotic heart disease of native coronary artery without angina pectoris: Secondary | ICD-10-CM | POA: Diagnosis not present

## 2017-09-12 DIAGNOSIS — E785 Hyperlipidemia, unspecified: Secondary | ICD-10-CM | POA: Insufficient documentation

## 2017-09-12 DIAGNOSIS — Z7982 Long term (current) use of aspirin: Secondary | ICD-10-CM | POA: Insufficient documentation

## 2017-09-12 DIAGNOSIS — Z87891 Personal history of nicotine dependence: Secondary | ICD-10-CM | POA: Diagnosis not present

## 2017-09-12 DIAGNOSIS — Z8249 Family history of ischemic heart disease and other diseases of the circulatory system: Secondary | ICD-10-CM | POA: Insufficient documentation

## 2017-09-12 DIAGNOSIS — Z79899 Other long term (current) drug therapy: Secondary | ICD-10-CM | POA: Diagnosis not present

## 2017-09-12 DIAGNOSIS — M19041 Primary osteoarthritis, right hand: Secondary | ICD-10-CM | POA: Diagnosis not present

## 2017-09-12 HISTORY — PX: CARPAL TUNNEL RELEASE: SHX101

## 2017-09-12 HISTORY — DX: Malignant (primary) neoplasm, unspecified: C80.1

## 2017-09-12 LAB — POCT I-STAT, CHEM 8
BUN: 26 mg/dL — ABNORMAL HIGH (ref 8–23)
CALCIUM ION: 1.19 mmol/L (ref 1.15–1.40)
CHLORIDE: 107 mmol/L (ref 98–111)
Creatinine, Ser: 1.9 mg/dL — ABNORMAL HIGH (ref 0.44–1.00)
Glucose, Bld: 81 mg/dL (ref 70–99)
HCT: 37 % (ref 36.0–46.0)
Hemoglobin: 12.6 g/dL (ref 12.0–15.0)
Potassium: 4.4 mmol/L (ref 3.5–5.1)
SODIUM: 140 mmol/L (ref 135–145)
TCO2: 22 mmol/L (ref 22–32)

## 2017-09-12 LAB — GLUCOSE, CAPILLARY: GLUCOSE-CAPILLARY: 76 mg/dL (ref 70–99)

## 2017-09-12 SURGERY — CARPAL TUNNEL RELEASE
Anesthesia: General | Site: Wrist | Laterality: Left

## 2017-09-12 MED ORDER — DEXAMETHASONE SODIUM PHOSPHATE 10 MG/ML IJ SOLN
INTRAMUSCULAR | Status: AC
  Filled 2017-09-12: qty 1

## 2017-09-12 MED ORDER — ACETAMINOPHEN 325 MG PO TABS: 325.0000 mg | ORAL_TABLET | ORAL | Status: DC | PRN

## 2017-09-12 MED ORDER — PHENYLEPHRINE 40 MCG/ML (10ML) SYRINGE FOR IV PUSH (FOR BLOOD PRESSURE SUPPORT)
PREFILLED_SYRINGE | INTRAVENOUS | Status: AC
  Filled 2017-09-12: qty 10

## 2017-09-12 MED ORDER — BUPIVACAINE HCL (PF) 0.25 % IJ SOLN
INTRAMUSCULAR | Status: DC | PRN
  Administered 2017-09-12: 10 mL

## 2017-09-12 MED ORDER — CEFAZOLIN SODIUM-DEXTROSE 2-4 GM/100ML-% IV SOLN
2.0000 g | INTRAVENOUS | Status: AC
  Administered 2017-09-12: 2 g via INTRAVENOUS

## 2017-09-12 MED ORDER — CHLORHEXIDINE GLUCONATE 4 % EX LIQD: 60.0000 mL | Freq: Once | CUTANEOUS | Status: DC

## 2017-09-12 MED ORDER — MIDAZOLAM HCL 2 MG/2ML IJ SOLN
INTRAMUSCULAR | Status: AC
  Filled 2017-09-12: qty 2

## 2017-09-12 MED ORDER — LIDOCAINE 2% (20 MG/ML) 5 ML SYRINGE
INTRAMUSCULAR | Status: AC
  Filled 2017-09-12: qty 5

## 2017-09-12 MED ORDER — OXYCODONE HCL 5 MG/5ML PO SOLN: 5.0000 mg | Freq: Once | ORAL | Status: DC | PRN

## 2017-09-12 MED ORDER — PROPOFOL 10 MG/ML IV BOLUS
INTRAVENOUS | Status: DC | PRN
  Administered 2017-09-12: 100 mg via INTRAVENOUS

## 2017-09-12 MED ORDER — OXYCODONE HCL 5 MG PO TABS
5.0000 mg | ORAL_TABLET | Freq: Once | ORAL | Status: DC | PRN
Start: 2017-09-12 — End: 2017-09-12

## 2017-09-12 MED ORDER — LACTATED RINGERS IV SOLN
INTRAVENOUS | Status: DC
  Administered 2017-09-12: 09:00:00 via INTRAVENOUS

## 2017-09-12 MED ORDER — SCOPOLAMINE 1 MG/3DAYS TD PT72: 1.0000 | MEDICATED_PATCH | Freq: Once | TRANSDERMAL | Status: DC | PRN

## 2017-09-12 MED ORDER — BUPIVACAINE HCL (PF) 0.25 % IJ SOLN
INTRAMUSCULAR | Status: AC
  Filled 2017-09-12: qty 120

## 2017-09-12 MED ORDER — ONDANSETRON HCL 4 MG/2ML IJ SOLN
INTRAMUSCULAR | Status: DC | PRN
  Administered 2017-09-12: 4 mg via INTRAVENOUS

## 2017-09-12 MED ORDER — FENTANYL CITRATE (PF) 100 MCG/2ML IJ SOLN
INTRAMUSCULAR | Status: AC
  Filled 2017-09-12: qty 2

## 2017-09-12 MED ORDER — SUCCINYLCHOLINE CHLORIDE 200 MG/10ML IV SOSY
PREFILLED_SYRINGE | INTRAVENOUS | Status: AC
  Filled 2017-09-12: qty 10

## 2017-09-12 MED ORDER — LIDOCAINE-EPINEPHRINE (PF) 1 %-1:200000 IJ SOLN
INTRAMUSCULAR | Status: AC
  Filled 2017-09-12: qty 60

## 2017-09-12 MED ORDER — ONDANSETRON HCL 4 MG/2ML IJ SOLN
INTRAMUSCULAR | Status: AC
  Filled 2017-09-12: qty 2

## 2017-09-12 MED ORDER — CEFAZOLIN SODIUM-DEXTROSE 2-4 GM/100ML-% IV SOLN
INTRAVENOUS | Status: AC
  Filled 2017-09-12: qty 100

## 2017-09-12 MED ORDER — FENTANYL CITRATE (PF) 100 MCG/2ML IJ SOLN
50.0000 ug | INTRAMUSCULAR | Status: DC | PRN
  Administered 2017-09-12: 25 ug via INTRAVENOUS

## 2017-09-12 MED ORDER — ACETAMINOPHEN 160 MG/5ML PO SOLN: 325.0000 mg | ORAL | Status: DC | PRN

## 2017-09-12 MED ORDER — MIDAZOLAM HCL 2 MG/2ML IJ SOLN: 1.0000 mg | INTRAMUSCULAR | Status: DC | PRN

## 2017-09-12 MED ORDER — FENTANYL CITRATE (PF) 100 MCG/2ML IJ SOLN: 25.0000 ug | INTRAMUSCULAR | Status: DC | PRN

## 2017-09-12 MED ORDER — HYDROCODONE-ACETAMINOPHEN 5-325 MG PO TABS: ORAL_TABLET | ORAL | 0 refills | Status: DC

## 2017-09-12 MED ORDER — EPHEDRINE 5 MG/ML INJ
INTRAVENOUS | Status: AC
  Filled 2017-09-12: qty 10

## 2017-09-12 MED ORDER — DEXAMETHASONE SODIUM PHOSPHATE 10 MG/ML IJ SOLN
INTRAMUSCULAR | Status: DC | PRN
  Administered 2017-09-12: 4 mg via INTRAVENOUS

## 2017-09-12 MED ORDER — LIDOCAINE HCL (CARDIAC) PF 100 MG/5ML IV SOSY
PREFILLED_SYRINGE | INTRAVENOUS | Status: DC | PRN
  Administered 2017-09-12: 30 mg via INTRAVENOUS

## 2017-09-12 SURGICAL SUPPLY — 38 items
BANDAGE ACE 3X5.8 VEL STRL LF (GAUZE/BANDAGES/DRESSINGS) ×3 IMPLANT
BLADE SURG 15 STRL LF DISP TIS (BLADE) ×2 IMPLANT
BLADE SURG 15 STRL SS (BLADE) ×6
BNDG CMPR 9X4 STRL LF SNTH (GAUZE/BANDAGES/DRESSINGS) ×1
BNDG ESMARK 4X9 LF (GAUZE/BANDAGES/DRESSINGS) ×2 IMPLANT
BNDG GAUZE ELAST 4 BULKY (GAUZE/BANDAGES/DRESSINGS) ×3 IMPLANT
CHLORAPREP W/TINT 26ML (MISCELLANEOUS) ×3 IMPLANT
CORD BIPOLAR FORCEPS 12FT (ELECTRODE) ×3 IMPLANT
COVER BACK TABLE 60X90IN (DRAPES) ×3 IMPLANT
COVER MAYO STAND STRL (DRAPES) ×3 IMPLANT
CUFF TOURNIQUET SINGLE 18IN (TOURNIQUET CUFF) ×3 IMPLANT
DRAPE EXTREMITY T 121X128X90 (DRAPE) ×3 IMPLANT
DRAPE SURG 17X23 STRL (DRAPES) ×3 IMPLANT
DRSG PAD ABDOMINAL 8X10 ST (GAUZE/BANDAGES/DRESSINGS) ×3 IMPLANT
GAUZE SPONGE 4X4 12PLY STRL (GAUZE/BANDAGES/DRESSINGS) ×3 IMPLANT
GAUZE XEROFORM 1X8 LF (GAUZE/BANDAGES/DRESSINGS) ×3 IMPLANT
GLOVE BIO SURGEON STRL SZ 6.5 (GLOVE) ×1 IMPLANT
GLOVE BIO SURGEON STRL SZ7.5 (GLOVE) ×3 IMPLANT
GLOVE BIO SURGEONS STRL SZ 6.5 (GLOVE) ×1
GLOVE BIOGEL PI IND STRL 7.0 (GLOVE) IMPLANT
GLOVE BIOGEL PI IND STRL 8 (GLOVE) ×1 IMPLANT
GLOVE BIOGEL PI INDICATOR 7.0 (GLOVE) ×2
GLOVE BIOGEL PI INDICATOR 8 (GLOVE) ×2
GOWN STRL REUS W/ TWL LRG LVL3 (GOWN DISPOSABLE) ×1 IMPLANT
GOWN STRL REUS W/TWL LRG LVL3 (GOWN DISPOSABLE) ×3
GOWN STRL REUS W/TWL XL LVL3 (GOWN DISPOSABLE) ×3 IMPLANT
NDL HYPO 25X1 1.5 SAFETY (NEEDLE) ×1 IMPLANT
NEEDLE HYPO 25X1 1.5 SAFETY (NEEDLE) ×3 IMPLANT
NS IRRIG 1000ML POUR BTL (IV SOLUTION) ×3 IMPLANT
PACK BASIN DAY SURGERY FS (CUSTOM PROCEDURE TRAY) ×3 IMPLANT
PADDING CAST ABS 4INX4YD NS (CAST SUPPLIES) ×2
PADDING CAST ABS COTTON 4X4 ST (CAST SUPPLIES) ×1 IMPLANT
STOCKINETTE 4X48 STRL (DRAPES) ×3 IMPLANT
SUT ETHILON 4 0 PS 2 18 (SUTURE) ×3 IMPLANT
SYR BULB 3OZ (MISCELLANEOUS) ×3 IMPLANT
SYR CONTROL 10ML LL (SYRINGE) ×3 IMPLANT
TOWEL GREEN STERILE FF (TOWEL DISPOSABLE) ×6 IMPLANT
UNDERPAD 30X30 (UNDERPADS AND DIAPERS) ×3 IMPLANT

## 2017-09-12 NOTE — H&P (Signed)
[name] is an 67 y.o. female.   Chief Complaint: left carpal tunnel syndrome HPI: 67 yo female with numbness and tingling left hand.  Positive nerve conduction studies.  Previous right carpal tunnel release.  She wishes to have a left carpal tunnel release.  Allergies: No Known Allergies  Past Medical History:  Diagnosis Date  . Angina   . Arthritis    HANDS"  . Asthma   . Cancer ([place]) 09/04/2017   Right Breast  . Carpal tunnel syndrome, bilateral 11/26/2016  . Cholelithiasis   . Cocaine abuse ([place]) 07/2012   per E.R. drug screen  . Coronary artery disease   . Diabetes mellitus without mention of complication    type 2  . Dysphagia    esophageal dysmotility on 03/2011 esophagram   . Fatty liver disease, nonalcoholic 1517   on Imaging.   Marland Kitchen GERD (gastroesophageal reflux disease)   . History of colonic polyps 2009   adenomatous 2009, HP in 2009, 2011, 2013.   Marland Kitchen Hyperlipidemia   . Hypertension   . Iron deficiency anemia   . Myocardial infarction ([place])    years ago, 6 stents placed  . 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 ABDOMINAL ANGIOGRAM  02/20/2011   Procedure: ABDOMINAL ANGIOGRAM;  Surgeon: Clent Demark, MD;  Location: [facility] CATH LAB;  Service: Cardiovascular;;  . CARPAL TUNNEL RELEASE Right 07/04/2017   Procedure: RIGHT CARPAL TUNNEL RELEASE;  Surgeon: Leanora Cover, MD;  Location: [facility];  Service: Orthopedics;  Laterality: Right;  . COLONOSCOPY  11/30/2011   Procedure: COLONOSCOPY;  Surgeon: Lafayette Dragon, MD;  Location: [facility] ENDOSCOPY;  Service: Endoscopy;  Laterality: N/A;  . CORONARY ANGIOPLASTY WITH STENT PLACEMENT    . ESOPHAGOGASTRODUODENOSCOPY  11/30/2011   Procedure: ESOPHAGOGASTRODUODENOSCOPY (EGD);  Surgeon: Lafayette Dragon, MD;  Location: [facility] ENDOSCOPY;  Service: Endoscopy;  Laterality: N/A;  . ESOPHAGOGASTRODUODENOSCOPY N/A 02/23/2015   Procedure: ESOPHAGOGASTRODUODENOSCOPY  (EGD);  Surgeon: Gatha Mayer, MD;  Location: [facility] ENDOSCOPY;  Service: Endoscopy;  Laterality: N/A;  . LEFT HEART CATHETERIZATION WITH CORONARY ANGIOGRAM N/A 02/20/2011   Procedure: LEFT HEART CATHETERIZATION WITH CORONARY ANGIOGRAM;  Surgeon: Clent Demark, MD;  Location: [place] CATH LAB;  Service: Cardiovascular;  Laterality: N/A;  . RIGHT COLECTOMY  02/2007   for post polypectomy colonic perforation  . TUBAL LIGATION      Family History: Family History  Problem Relation Age of Onset  . Heart disease Mother   . Diabetes Mother   . Cancer Father        unsure what kind  . Diabetes Sister   . Colon cancer Neg Hx   . Breast cancer Neg Hx     Social History:   reports that she quit smoking about 7 years ago. Her smoking use included cigarettes. She has a 23.50 pack-year smoking history. She has never used smokeless tobacco. She reports that she has current or past drug history. Drug: Cocaine. She reports that she does not drink alcohol.  Medications: Medications Prior to Admission  Medication Sig Dispense Refill  . ALPRAZolam (XANAX) 0.25 MG tablet Take 0.25 mg by mouth 2 (two) times daily.     Marland Kitchen amLODipine (NORVASC) 5 MG tablet Take 5 mg by mouth daily.    Marland Kitchen aspirin EC 81 MG EC tablet Take 1 tablet (81 mg total) by mouth daily. 30 tablet 0  . atorvastatin (LIPITOR) 20 MG tablet Take  20 mg by mouth daily.    . budesonide-formoterol (SYMBICORT) 160-4.5 MCG/ACT inhaler Inhale 2 puffs into the lungs 2 (two) times daily.    . cyclobenzaprine (FLEXERIL) 10 MG tablet Take 1 tablet (10 mg total) by mouth 2 (two) times daily as needed for muscle spasms. 20 tablet 0  . feeding supplement, ENSURE ENLIVE, (ENSURE ENLIVE) LIQD Take 237 mLs by mouth 2 (two) times daily between meals. 60 Bottle 0  . Insulin Glargine (LANTUS SOLOSTAR) 100 UNIT/ML Solostar Pen Inject 20 Units into the skin daily at 10 pm. (Patient taking differently: Inject 40 Units into the skin 1 day or 1 dose. ) 15 mL 0  . metoprolol  succinate (TOPROL-XL) 50 MG 24 hr tablet Take 50 mg by mouth every evening. Take with or immediately following a meal.    . nitroGLYCERIN (NITROSTAT) 0.4 MG SL tablet Place 1 tablet (0.4 mg total) under the tongue every 5 (five) minutes as needed. For chest pain (Patient taking differently: Place 0.4 mg under the tongue every 5 (five) minutes as needed for chest pain. ) 10 tablet 0  . pantoprazole (PROTONIX) 40 MG tablet Take 1 tablet (40 mg total) by mouth daily. 60 tablet 0  . albuterol (PROVENTIL) (2.5 MG/3ML) 0.083% nebulizer solution Take 2.5 mg by nebulization 2 (two) times daily.    Marland Kitchen HYDROcodone-acetaminophen (NORCO) 5-325 MG tablet 1-2 tabs po q6 hours prn pain 20 tablet 0  . loperamide (IMODIUM) 2 MG capsule Take 1 capsule (2 mg total) by mouth 4 (four) times daily as needed for diarrhea or loose stools. 12 capsule 0  . metFORMIN (GLUCOPHAGE) 1000 MG tablet Take 500 mg by mouth 2 (two) times daily with a meal.       No results found for this or any previous visit (from the past 48 hour(s)).  No results found.   A comprehensive review of systems was negative except for: Constitutional: positive for chills Respiratory: positive for shortness of breath Gastrointestinal: positive for diarrhea and vomiting Hematologic/lymphatic: positive for easy bruising Musculoskeletal: positive for neck pain Neurological: positive for paresthesia  Blood pressure (!) 127/59, pulse 72, temperature 98.2 F (36.8 C), temperature source Oral, resp. rate 18, height 4\' 11"  (1.499 m), weight 58.1 kg, SpO2 99 %.  General appearance: alert, cooperative and appears stated age Head: Normocephalic, without obvious abnormality, atraumatic Neck: supple, symmetrical, trachea midline Cardio: regular rate and rhythm Resp: clear to auscultation bilaterally Extremities: Intact sensation and capillary refill all digits.  +epl/fpl/io.  No wounds.  Pulses: 2+ and symmetric Skin: Skin color, texture, turgor normal. No  rashes or lesions Neurologic: Grossly normal Incision/Wound: none  Assessment/Plan Left carpal tunnel release.  Non operative and operative treatment options were discussed with the patient and patient wishes to proceed with operative treatment. Risks, benefits, and alternatives of surgery were discussed and the patient agrees with the plan of care.   Devory Mckinzie R 09/12/2017, 8:35 AM

## 2017-09-12 NOTE — Op Note (Signed)
09/12/2017 [facility]                              OPERATIVE REPORT   PREOPERATIVE DIAGNOSIS:  Left carpal tunnel syndrome.  POSTOPERATIVE DIAGNOSIS:  Left carpal tunnel syndrome.  PROCEDURE:  Left carpal tunnel release.  SURGEON:  Leanora Cover, MD  ASSISTANT:  none.  ANESTHESIA: General  IV FLUIDS:  Per anesthesia flow sheet.  ESTIMATED BLOOD LOSS:  Minimal.  COMPLICATIONS:  None.  SPECIMENS:  None.  TOURNIQUET TIME:    Total Tourniquet Time Documented: Upper Arm (Left) - 15 minutes Total: Upper Arm (Left) - 15 minutes   DISPOSITION:  Stable to PACU.  LOCATION: [facility]  INDICATIONS:  67 yo female with numbness and tingling left hand.  Positive nerve conduction studies.  She wishes to have a carpal tunnel release for management of her symptoms.  Risks, benefits and alternatives of surgery were discussed including the risk of blood loss; infection; damage to nerves, vessels, tendons, ligaments, bone; failure of surgery; need for additional surgery; complications with wound healing; continued pain; recurrence of carpal tunnel syndrome; and damage to motor branch. She voiced understanding of these risks and elected to proceed.   OPERATIVE COURSE:  After being identified preoperatively by myself, the patient and I agreed upon the procedure and site of procedure.  The surgical site was marked.  The risks, benefits, and alternatives of the surgery were reviewed and she wished to proceed.  Surgical consent had been signed.  She was given IV Ancef as preoperative antibiotic prophylaxis.  She was transferred to the operating room and placed on the operating room table in supine position with the Left upper extremity on an armboard.  General anesthesia was induced by the anesthesiologist.  Left upper extremity was prepped and draped in normal sterile orthopaedic fashion.  A surgical pause was performed between the surgeons, anesthesia, and operating room  staff, and all were in agreement as to the patient, procedure, and site of procedure.  Tourniquet at the proximal aspect of the extremity was inflated to 250 mmHg after exsanguination of the arm with an Esmarch bandage  Incision was made over the transverse carpal ligament and carried into the subcutaneous tissues by spreading technique.  Bipolar electrocautery was used to obtain hemostasis.  The palmar fascia was sharply incised.  The transverse carpal ligament was identified and sharply incised.  It was incised distally first.  Care was taken to ensure complete decompression distally.  It was then incised proximally.  Scissors were used to split the distal aspect of the volar antebrachial fascia.  A finger was placed into the wound to ensure complete decompression, which was the case.  The nerve was examined.  It was adherent to the radial leaflet.  The motor branch was identified and was intact.  The wound was copiously irrigated with sterile saline.  It was then closed with 4-0 nylon in a horizontal mattress fashion.  It was injected with 0.25% plain Marcaine to aid in postoperative analgesia.  It was dressed with sterile Xeroform, 4x4s, an ABD, and wrapped with Kerlix and an Ace bandage.  Tourniquet was deflated at 15 minutes.  Fingertips were pink with brisk capillary refill after deflation of the tourniquet.  Operative drapes were broken down.  The patient was awoken from anesthesia safely.  She was transferred back to stretcher and taken to the PACU in stable condition.  I will see  her back in the office in 1 week for postoperative followup.  I will give her a prescription for Norco 5/325 1-2 tabs PO q6 hours prn pain, dispense # 20.    Tennis Must, MD Electronically signed, 09/12/17

## 2017-09-12 NOTE — Anesthesia Procedure Notes (Addendum)
Procedure Name: LMA Insertion Date/Time: 09/12/2017 9:00 AM Performed by: Willa Frater, CRNA Pre-anesthesia Checklist: Patient identified, Emergency Drugs available, Suction available and Patient being monitored Patient Re-evaluated:Patient Re-evaluated prior to induction Oxygen Delivery Method: Circle system utilized Preoxygenation: Pre-oxygenation with 100% oxygen Induction Type: IV induction Ventilation: Mask ventilation without difficulty LMA: LMA inserted LMA Size: 4.0 Number of attempts: 1 Airway Equipment and Method: Bite block Placement Confirmation: positive ETCO2 Tube secured with: Tape Dental Injury: Teeth and Oropharynx as per pre-operative assessment

## 2017-09-12 NOTE — Transfer of Care (Signed)
Immediate Anesthesia Transfer of Care Note  Patient: [name]  Procedure(s) Performed: LEFT CARPAL TUNNEL RELEASE (Left Wrist)  Patient Location: PACU  Anesthesia Type:General  Level of Consciousness: awake and drowsy  Airway & Oxygen Therapy: Patient Spontanous Breathing and Patient connected to face mask oxygen  Post-op Assessment: Report given to RN and Post -op Vital signs reviewed and stable  Post vital signs: Reviewed and stable  Last Vitals:  Vitals Value Taken Time  BP    Temp    Pulse 70 09/12/2017  9:38 AM  Resp 14 09/12/2017  9:38 AM  SpO2 100 % 09/12/2017  9:38 AM  Vitals shown include unvalidated device data.  Last Pain:  Vitals:   09/12/17 0808  TempSrc: Oral  PainSc: 0-No pain         Complications: No apparent anesthesia complications

## 2017-09-12 NOTE — Discharge Instructions (Addendum)

## 2017-09-12 NOTE — Anesthesia Preprocedure Evaluation (Addendum)
Anesthesia Evaluation  Patient identified by MRN, date of birth, ID band Patient awake    Reviewed: Allergy & Precautions, NPO status , Patient's Chart, lab work & pertinent test results  Airway Mallampati: II  TM Distance: >3 FB Neck ROM: Full    Dental  (+) Edentulous Upper, Poor Dentition, Missing   Pulmonary asthma , former smoker,    Pulmonary exam normal breath sounds clear to auscultation       Cardiovascular hypertension, Pt. on medications + angina with exertion + CAD, + Past MI, + Cardiac Stents and + DOE  Normal cardiovascular exam Rhythm:Regular Rate:Normal     Neuro/Psych PSYCHIATRIC DISORDERS Depression Right Carpal tunnel syndrome  Neuromuscular disease    GI/Hepatic GERD  Medicated and Controlled,(+)     substance abuse  cocaine use, Denies recent use   Endo/Other  diabetes, Well Controlled, Type 2, Oral Hypoglycemic AgentsHyperlipidemia  Renal/GU Renal InsufficiencyRenal disease  negative genitourinary   Musculoskeletal  (+) Arthritis ,   Abdominal   Peds  Hematology  (+) anemia ,   Anesthesia Other Findings   Reproductive/Obstetrics                             Anesthesia Physical  Anesthesia Plan  ASA: III  Anesthesia Plan: General   Post-op Pain Management:    Induction: Intravenous  PONV Risk Score and Plan: 2 and Ondansetron, Propofol infusion and Treatment may vary due to age or medical condition  Airway Management Planned: LMA  Additional Equipment: None  Intra-op Plan:   Post-operative Plan:   Informed Consent: I have reviewed the patients History and Physical, chart, labs and discussed the procedure including the risks, benefits and alternatives for the proposed anesthesia with the patient or authorized representative who has indicated his/her understanding and acceptance.   Dental advisory given  Plan Discussed with: CRNA  Anesthesia Plan  Comments:        Anesthesia Quick Evaluation

## 2017-09-12 NOTE — Anesthesia Postprocedure Evaluation (Signed)
Anesthesia Post Note  Patient: [name]  Procedure(s) Performed: LEFT CARPAL TUNNEL RELEASE (Left Wrist)     Patient location during evaluation: PACU Anesthesia Type: General Level of consciousness: sedated and patient cooperative Pain management: pain level controlled Vital Signs Assessment: post-procedure vital signs reviewed and stable Respiratory status: spontaneous breathing Cardiovascular status: stable Anesthetic complications: no    Last Vitals:  Vitals:   09/12/17 1024 09/12/17 1045  BP:  (!) 175/66  Pulse: 65 64  Resp: (!) 25 16  Temp:  36.4 C  SpO2: 100% 99%    Last Pain:  Vitals:   09/12/17 1045  TempSrc:   PainSc: 0-No pain                 Nolon Nations

## 2017-09-13 ENCOUNTER — Encounter (HOSPITAL_BASED_OUTPATIENT_CLINIC_OR_DEPARTMENT_OTHER): Payer: Self-pay | Admitting: Orthopedic Surgery

## 2017-09-17 DIAGNOSIS — C50911 Malignant neoplasm of unspecified site of right female breast: Secondary | ICD-10-CM | POA: Diagnosis not present

## 2017-09-17 DIAGNOSIS — Z170 Estrogen receptor positive status [ER+]: Secondary | ICD-10-CM | POA: Diagnosis not present

## 2017-09-19 ENCOUNTER — Telehealth: Payer: Self-pay | Admitting: Hematology and Oncology

## 2017-09-19 NOTE — Telephone Encounter (Signed)
Spoke to the pt's niece, O'sha, and scheduled the pt to see Dr. Lindi Adie on 8/29 at 1pm. Ms. Bethanie Dicker aware to arrive 30 minutes early. Voiced understanding. I provided the location to our facility.

## 2017-09-20 DIAGNOSIS — E782 Mixed hyperlipidemia: Secondary | ICD-10-CM | POA: Diagnosis not present

## 2017-09-20 DIAGNOSIS — E1100 Type 2 diabetes mellitus with hyperosmolarity without nonketotic hyperglycemic-hyperosmolar coma (NKHHC): Secondary | ICD-10-CM | POA: Diagnosis not present

## 2017-09-20 DIAGNOSIS — I10 Essential (primary) hypertension: Secondary | ICD-10-CM | POA: Diagnosis not present

## 2017-09-20 DIAGNOSIS — J441 Chronic obstructive pulmonary disease with (acute) exacerbation: Secondary | ICD-10-CM | POA: Diagnosis not present

## 2017-09-20 DIAGNOSIS — C50111 Malignant neoplasm of central portion of right female breast: Secondary | ICD-10-CM | POA: Diagnosis not present

## 2017-09-20 DIAGNOSIS — R7309 Other abnormal glucose: Secondary | ICD-10-CM | POA: Diagnosis not present

## 2017-09-30 ENCOUNTER — Other Ambulatory Visit: Payer: Self-pay

## 2017-09-30 ENCOUNTER — Ambulatory Visit: Payer: Self-pay | Admitting: Oncology

## 2017-09-30 DIAGNOSIS — Z01810 Encounter for preprocedural cardiovascular examination: Secondary | ICD-10-CM | POA: Diagnosis not present

## 2017-09-30 DIAGNOSIS — J45909 Unspecified asthma, uncomplicated: Secondary | ICD-10-CM | POA: Diagnosis not present

## 2017-09-30 DIAGNOSIS — E119 Type 2 diabetes mellitus without complications: Secondary | ICD-10-CM | POA: Diagnosis not present

## 2017-09-30 DIAGNOSIS — I10 Essential (primary) hypertension: Secondary | ICD-10-CM | POA: Diagnosis not present

## 2017-09-30 DIAGNOSIS — I25118 Atherosclerotic heart disease of native coronary artery with other forms of angina pectoris: Secondary | ICD-10-CM | POA: Diagnosis not present

## 2017-10-03 ENCOUNTER — Encounter: Payer: Self-pay | Admitting: *Deleted

## 2017-10-03 ENCOUNTER — Inpatient Hospital Stay: Payer: Medicare Other | Attending: Oncology | Admitting: Hematology and Oncology

## 2017-10-03 DIAGNOSIS — Z170 Estrogen receptor positive status [ER+]: Secondary | ICD-10-CM | POA: Insufficient documentation

## 2017-10-03 DIAGNOSIS — N189 Chronic kidney disease, unspecified: Secondary | ICD-10-CM | POA: Diagnosis not present

## 2017-10-03 DIAGNOSIS — C50411 Malignant neoplasm of upper-outer quadrant of right female breast: Secondary | ICD-10-CM | POA: Diagnosis not present

## 2017-10-03 DIAGNOSIS — I10 Essential (primary) hypertension: Secondary | ICD-10-CM | POA: Diagnosis not present

## 2017-10-03 DIAGNOSIS — D509 Iron deficiency anemia, unspecified: Secondary | ICD-10-CM | POA: Diagnosis not present

## 2017-10-03 DIAGNOSIS — E119 Type 2 diabetes mellitus without complications: Secondary | ICD-10-CM | POA: Diagnosis not present

## 2017-10-03 HISTORY — DX: Malignant neoplasm of upper-outer quadrant of right female breast: C50.411

## 2017-10-03 NOTE — Progress Notes (Signed)
[facility] CONSULT NOTE  Patient Care Team: Lucianne Lei, MD as PCP - General (Family Medicine)  CHIEF COMPLAINTS/PURPOSE OF CONSULTATION:  Newly diagnosed breast cancer  HISTORY OF PRESENTING ILLNESS:  [name] 67 y.o. female is here because of recent diagnosis of right breast cancer.  Patient had a routine screening mammogram that detected a mass along with calcifications in the right breast.  She also had lymph node in the right axilla.  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  Biopsy of the tumor revealed grade 2-3 invasive ductal carcinoma that was ER PR positive and HER-2 positive.  Lymph node biopsy was negative.  Patient was presented to the multidisciplinary tumor board and the recommendation was to perform mastectomy followed by adjuvant treatment.  The delay has been primarily because of delay in getting cardiac clearance.  She tells me that she got the cardiac clearance so she is planning on undergoing surgery soon.  I reviewed her records extensively and collaborated the history with the patient.  SUMMARY OF ONCOLOGIC HISTORY:   Malignant neoplasm of upper-outer quadrant of right breast in female, estrogen receptor positive ([place])   09/03/2017 Initial Diagnosis    Screening mammogram detected 1.4 cm right breast mass at 10 o'clock position, additional pleomorphic calcifications 2 cm posteriorly, additional loosely grouped calcifications 10.1 x 7.1 x 5.3 cm, single right axillary lymph node: Biopsy revealed DCIS ER 100%, PR 50%; IDC grade 2-3 ER 100%, PR 50%, Ki-67 15%, HER-2 positive ratio 2, copy #5, lymph node negative T2 N0 stage Ia clinical stage AJCC 8      MEDICAL HISTORY:  Past Medical History:  Diagnosis Date  . Angina   . Arthritis    HANDS"  . Asthma   . Cancer ([place]) 09/04/2017   Right Breast  . Carpal tunnel syndrome, bilateral 11/26/2016  . Cholelithiasis   . Cocaine abuse ([place]) 07/2012   per E.R. drug screen  . Coronary artery disease   . Diabetes mellitus without mention of complication    type 2  . Dysphagia    esophageal dysmotility on 03/2011 esophagram   . Fatty liver disease, nonalcoholic 5643   on Imaging.   Marland Kitchen GERD (gastroesophageal reflux disease)   . History of colonic polyps 2009   adenomatous 2009, HP in 2009, 2011, 2013.   Marland Kitchen Hyperlipidemia   . Hypertension   . Iron deficiency anemia   . Myocardial infarction ([place])    years ago, 6 stents placed  . Ulnar neuropathy at elbow, 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    SURGICAL HISTORY: Past Surgical History:  Procedure Laterality Date  . ABDOMINAL ANGIOGRAM  02/20/2011   Procedure: ABDOMINAL ANGIOGRAM;  Surgeon: Clent Demark, MD;  Location: [facility] CATH LAB;  Service: Cardiovascular;;  . CARPAL TUNNEL RELEASE Right 07/04/2017   Procedure: RIGHT CARPAL TUNNEL RELEASE;  Surgeon: Leanora Cover, MD;  Location: [facility];  Service: Orthopedics;  Laterality: Right;  . CARPAL TUNNEL RELEASE Left 09/12/2017   Procedure: LEFT CARPAL TUNNEL RELEASE;  Surgeon: Leanora Cover, MD;  Location: [facility];  Service: Orthopedics;  Laterality: Left;  . COLONOSCOPY  11/30/2011   Procedure: COLONOSCOPY;  Surgeon: Lafayette Dragon, MD;  Location: [facility] ENDOSCOPY;  Service: Endoscopy;  Laterality: N/A;  . CORONARY ANGIOPLASTY WITH STENT PLACEMENT    . ESOPHAGOGASTRODUODENOSCOPY  11/30/2011   Procedure: ESOPHAGOGASTRODUODENOSCOPY (EGD);  Surgeon: Lafayette Dragon, MD;  Location: [facility] ENDOSCOPY;  Service: Endoscopy;  Laterality:  N/A;  . ESOPHAGOGASTRODUODENOSCOPY N/A 02/23/2015   Procedure: ESOPHAGOGASTRODUODENOSCOPY (EGD);  Surgeon: Gatha Mayer, MD;  Location: [facility] ENDOSCOPY;  Service: Endoscopy;  Laterality: N/A;  . LEFT HEART CATHETERIZATION WITH CORONARY ANGIOGRAM N/A 02/20/2011   Procedure: LEFT HEART CATHETERIZATION WITH CORONARY ANGIOGRAM;  Surgeon: Clent Demark, MD;  Location: [facility] CATH LAB;  Service: Cardiovascular;  Laterality:  N/A;  . RIGHT COLECTOMY  02/2007   for post polypectomy colonic perforation  . TUBAL LIGATION      SOCIAL HISTORY: Social History   Socioeconomic History  . Marital status: Divorced    Spouse name: Not on file  . Number of children: 2  . Years of education: Not on file  . Highest education level: Not on file  Occupational History  . Occupation: retired. prev worked in housekeeping w/ [employer]  . Financial resource strain: Not on file  . Food insecurity:    Worry: Not on file    Inability: Not on file  . Transportation needs:    Medical: Not on file    Non-medical: Not on file  Tobacco Use  . Smoking status: Former Smoker    Packs/day: 0.50    Years: 47.00    Pack years: 23.50    Types: Cigarettes    Last attempt to quit: 05/09/2010    Years since quitting: 7.4  . Smokeless tobacco: Never Used  Substance and Sexual Activity  . Alcohol use: No  . Drug use: Not Currently    Types: Cocaine    Comment: positive drug screen (cocaine, benzo's)07/2012 in emergency room  . Sexual activity: Never    Birth control/protection: Post-menopausal  Lifestyle  . Physical activity:    Days per week: Not on file    Minutes per session: Not on file  . Stress: Not on file  Relationships  . Social connections:    Talks on phone: Not on file    Gets together: Not on file    Attends religious service: Not on file    Active member of club or organization: Not on file    Attends meetings of clubs or organizations: Not on file    Relationship status: Not on file  . Intimate partner violence:    Fear of current or ex partner: Not on file    Emotionally abused: Not on file    Physically abused: Not on file    Forced sexual activity: Not on file  Other Topics Concern  . Not on file  Social History Narrative   Caffeine use daily.    FAMILY HISTORY: Family History  Problem Relation Age of Onset  . Heart disease Mother   . Diabetes Mother   . Cancer Father        unsure  what kind  . Diabetes Sister   . Colon cancer Neg Hx   . Breast cancer Neg Hx     ALLERGIES:  has No Known Allergies.  MEDICATIONS:  Current Outpatient Medications  Medication Sig Dispense Refill  . albuterol (PROVENTIL) (2.5 MG/3ML) 0.083% nebulizer solution Take 2.5 mg by nebulization 2 (two) times daily.    Marland Kitchen ALPRAZolam (XANAX) 0.25 MG tablet Take 0.25 mg by mouth 2 (two) times daily.     Marland Kitchen amLODipine (NORVASC) 5 MG tablet Take 5 mg by mouth daily.    Marland Kitchen aspirin EC 81 MG EC tablet Take 1 tablet (81 mg total) by mouth daily. 30 tablet 0  . atorvastatin (LIPITOR) 20 MG tablet Take 20 mg  by mouth daily.    . budesonide-formoterol (SYMBICORT) 160-4.5 MCG/ACT inhaler Inhale 2 puffs into the lungs 2 (two) times daily.    . cyclobenzaprine (FLEXERIL) 10 MG tablet Take 1 tablet (10 mg total) by mouth 2 (two) times daily as needed for muscle spasms. 20 tablet 0  . feeding supplement, ENSURE ENLIVE, (ENSURE ENLIVE) LIQD Take 237 mLs by mouth 2 (two) times daily between meals. 60 Bottle 0  . HYDROcodone-acetaminophen (NORCO) 5-325 MG tablet 1-2 tabs po q6 hours prn pain 20 tablet 0  . HYDROcodone-acetaminophen (NORCO) 5-325 MG tablet 1-2 tabs po q6 hours prn pain 20 tablet 0  . Insulin Glargine (LANTUS SOLOSTAR) 100 UNIT/ML Solostar Pen Inject 20 Units into the skin daily at 10 pm. (Patient taking differently: Inject 40 Units into the skin 1 day or 1 dose. ) 15 mL 0  . loperamide (IMODIUM) 2 MG capsule Take 1 capsule (2 mg total) by mouth 4 (four) times daily as needed for diarrhea or loose stools. 12 capsule 0  . metFORMIN (GLUCOPHAGE) 1000 MG tablet Take 500 mg by mouth 2 (two) times daily with a meal.     . metoprolol succinate (TOPROL-XL) 50 MG 24 hr tablet Take 50 mg by mouth every evening. Take with or immediately following a meal.    . nitroGLYCERIN (NITROSTAT) 0.4 MG SL tablet Place 1 tablet (0.4 mg total) under the tongue every 5 (five) minutes as needed. For chest pain (Patient taking  differently: Place 0.4 mg under the tongue every 5 (five) minutes as needed for chest pain. ) 10 tablet 0  . pantoprazole (PROTONIX) 40 MG tablet Take 1 tablet (40 mg total) by mouth daily. 60 tablet 0   No current facility-administered medications for this visit.     REVIEW OF SYSTEMS:   Constitutional: Denies fevers, chills or abnormal night sweats Eyes: Denies blurriness of vision, double vision or watery eyes Ears, nose, mouth, throat, and face: Denies mucositis or sore throat Respiratory: Denies cough, dyspnea or wheezes Cardiovascular: Denies palpitation, chest discomfort or lower extremity swelling Gastrointestinal:  Denies nausea, heartburn or change in bowel habits Skin: Denies abnormal skin rashes Lymphatics: Denies new lymphadenopathy or easy bruising Neurological:Denies numbness, tingling or new weaknesses Behavioral/Psych: Mood is stable, no new changes  Breast:  Denies any palpable lumps or discharge All other systems were reviewed with the patient and are negative.  PHYSICAL EXAMINATION: ECOG PERFORMANCE STATUS: 3 - Symptomatic, >50% confined to bed  Vitals:   10/03/17 1313  BP: (!) 160/79  Pulse: 77  Resp: 17  Temp: 98.4 F (36.9 C)  SpO2: 100%   Filed Weights   10/03/17 1313  Weight: 130 lb 6.4 oz (59.1 kg)    GENERAL:alert, no distress and comfortable SKIN: skin color, texture, turgor are normal, no rashes or significant lesions EYES: normal, conjunctiva are pink and non-injected, sclera clear OROPHARYNX:no exudate, no erythema and lips, buccal mucosa, and tongue normal  NECK: supple, thyroid normal size, non-tender, without nodularity LYMPH:  no palpable lymphadenopathy in the cervical, axillary or inguinal LUNGS: clear to auscultation and percussion with normal breathing effort HEART: regular rate & rhythm and no murmurs and no lower extremity edema ABDOMEN:abdomen soft, non-tender and normal bowel sounds Musculoskeletal:no cyanosis of digits and no  clubbing  PSYCH: alert & oriented x 3 with fluent speech NEURO: no focal motor/sensory deficits    LABORATORY DATA:  I have reviewed the data as listed Lab Results  Component Value Date   WBC 7.8 11/27/2015  HGB 12.6 09/12/2017   HCT 37.0 09/12/2017   MCV 94.4 11/27/2015   PLT 243 11/27/2015   Lab Results  Component Value Date   NA 140 09/12/2017   K 4.4 09/12/2017   CL 107 09/12/2017   CO2 27 04/20/2016    RADIOGRAPHIC STUDIES: I have personally reviewed the radiological reports and agreed with the findings in the report.  ASSESSMENT AND PLAN:  Malignant neoplasm of upper-outer quadrant of right breast in female, estrogen receptor positive ([place]) 09/02/2017:Screening mammogram detected 1.4 cm right breast mass at 10 o'clock position, additional pleomorphic calcifications 2 cm posteriorly, additional loosely grouped calcifications 10.1 x 7.1 x 5.3 cm, single right axillary lymph node: Biopsy revealed DCIS ER 100%, PR 50%; IDC grade 2-3 ER 100%, PR 50%, Ki-67 15%, HER-2 positive ratio 2, copy #5, lymph node negative T2 N0 stage Ia clinical stage AJCC 8  Patient is not generally healthy and uses a walker to move around and has multiple comorbidities including diabetes, CAD, toxic encephalopathy, CKD, IDA.  Pathology and radiology counseling: Discussed with the patient, the details of pathology including the type of breast cancer,the clinical staging, the significance of ER, PR and HER-2/neu receptors and the implications for treatment. After reviewing the pathology in detail, we proceeded to discuss the different treatment options between surgery, Herceptin and, antiestrogen therapies.  Tumor board recommendations: 1.  Mastectomy with sentinel lymph node study 2. adjuvant Herceptin.  I do not believe she will be able to tolerate Taxol Herceptin either.  We will plan to give Herceptin every 3 weeks for 1 year 3.  Adjuvant antiestrogen therapy with letrozole 2.5 mg daily  It has  taken a long time for surgery to happen because of getting cardiac clearance.  Return to clinic after surgery to discuss pathology report and initiate adjuvant therapy plan.    All questions were answered. The patient knows to call the clinic with any problems, questions or concerns.    Harriette Ohara, MD 10/03/17

## 2017-10-03 NOTE — Assessment & Plan Note (Signed)
09/02/2017:Screening mammogram detected 1.4 cm right breast mass at 10 o'clock position, additional pleomorphic calcifications 2 cm posteriorly, additional loosely grouped calcifications 10.1 x 7.1 x 5.3 cm, single right axillary lymph node: Biopsy revealed DCIS ER 100%, PR 50%; IDC grade 2-3 ER 100%, PR 50%, Ki-67 15%, HER-2 positive ratio 2, copy #5, lymph node negative T2 N0 stage Ia clinical stage AJCC 8  Patient is not generally healthy and uses a walker to move around and has multiple comorbidities including diabetes, CAD, toxic encephalopathy, CKD, IDA.  Pathology and radiology counseling: Discussed with the patient, the details of pathology including the type of breast cancer,the clinical staging, the significance of ER, PR and HER-2/neu receptors and the implications for treatment. After reviewing the pathology in detail, we proceeded to discuss the different treatment options between surgery, Herceptin and, antiestrogen therapies.  Tumor board recommendations: 1.  Mastectomy with sentinel lymph node study 2. adjuvant Herceptin.  I do not believe she will be able to tolerate Taxol Herceptin either.  We will plan to give Herceptin every 3 weeks for 1 year 3.  Adjuvant antiestrogen therapy with letrozole 2.5 mg daily  It has taken a long time for surgery to happen because of getting cardiac clearance.  Return to clinic after surgery to discuss pathology report and initiate adjuvant therapy plan.

## 2017-10-10 DIAGNOSIS — H35371 Puckering of macula, right eye: Secondary | ICD-10-CM | POA: Diagnosis not present

## 2017-10-10 DIAGNOSIS — H10413 Chronic giant papillary conjunctivitis, bilateral: Secondary | ICD-10-CM | POA: Diagnosis not present

## 2017-10-10 DIAGNOSIS — H5213 Myopia, bilateral: Secondary | ICD-10-CM | POA: Diagnosis not present

## 2017-10-10 DIAGNOSIS — H04123 Dry eye syndrome of bilateral lacrimal glands: Secondary | ICD-10-CM | POA: Diagnosis not present

## 2017-10-10 DIAGNOSIS — Z961 Presence of intraocular lens: Secondary | ICD-10-CM | POA: Diagnosis not present

## 2017-10-10 DIAGNOSIS — E119 Type 2 diabetes mellitus without complications: Secondary | ICD-10-CM | POA: Diagnosis not present

## 2017-10-22 ENCOUNTER — Institutional Professional Consult (permissible substitution): Payer: Self-pay | Admitting: Internal Medicine

## 2017-10-25 ENCOUNTER — Institutional Professional Consult (permissible substitution): Payer: Medicare Other | Admitting: Pulmonary Disease

## 2017-10-30 IMAGING — CT CT ABD-PELV W/O CM
2 of 4 series · 16 of 46 positions shown, 18 images · non-contrast
Comparison: CT of the abdomen and pelvis performed 03/31/2014

CLINICAL DATA: Acute onset of nausea, vomiting and diarrhea. Mid
abdominal pain, radiating from the xiphoid process down to the
pelvis. Initial encounter.

EXAM:
CT ABDOMEN AND PELVIS WITHOUT CONTRAST
TECHNIQUE: Multidetector CT imaging of the abdomen and pelvis was performed
following the standard protocol without IV contrast.

[Series 2: a/p w/o 5mm · axial · non-contrast · 0.65mm/px · z∈[-447,-47]mm · 13 of 88 slices shown, 15 images]
[im 4/88  soft-tissue]
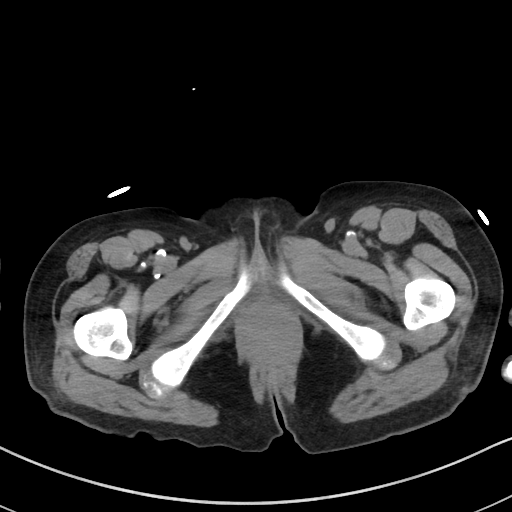
[im 4/88  bone]
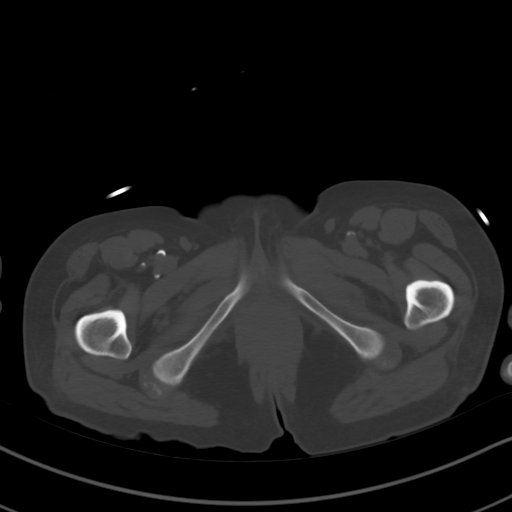
[im 12/88  soft-tissue]
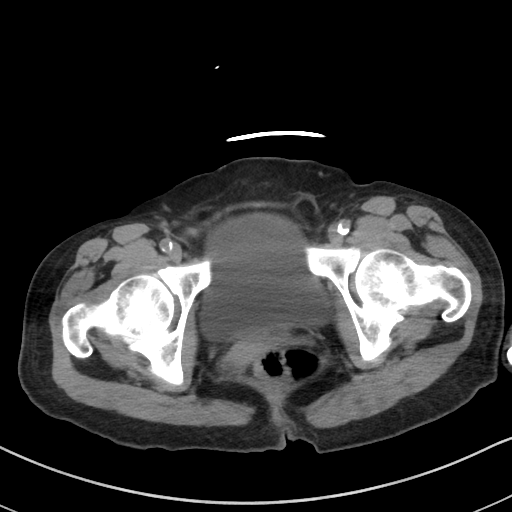
[im 19/88  soft-tissue]
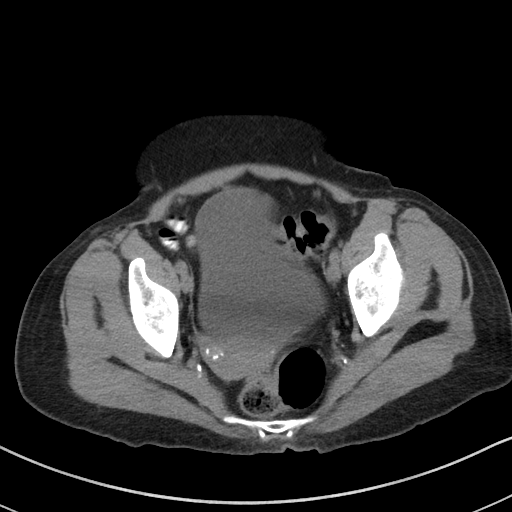
[im 23/88  soft-tissue]
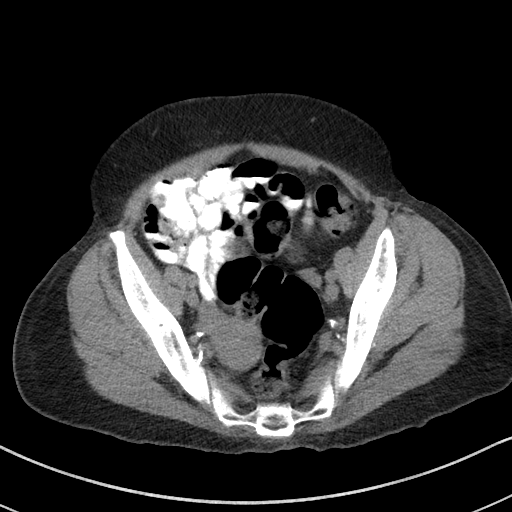
[im 31/88  soft-tissue]
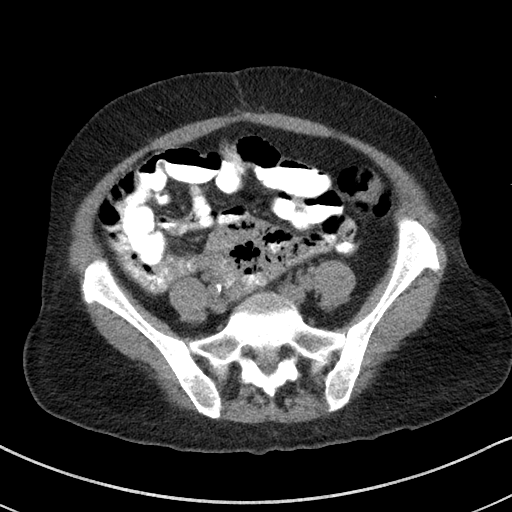
[im 38/88  soft-tissue]
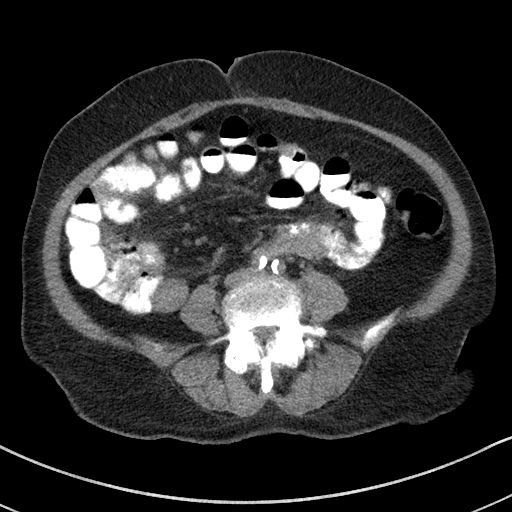
[im 46/88  soft-tissue]
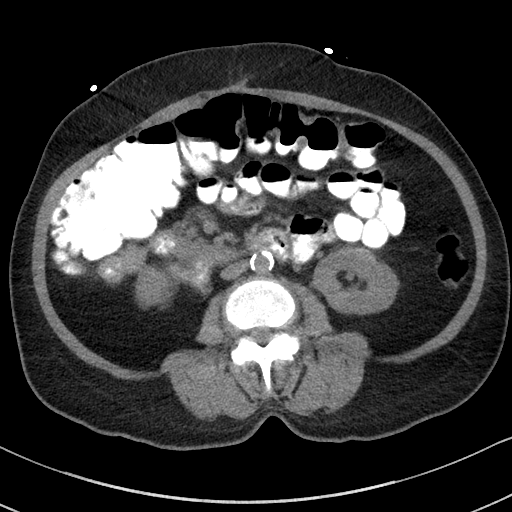
[im 50/88  soft-tissue]
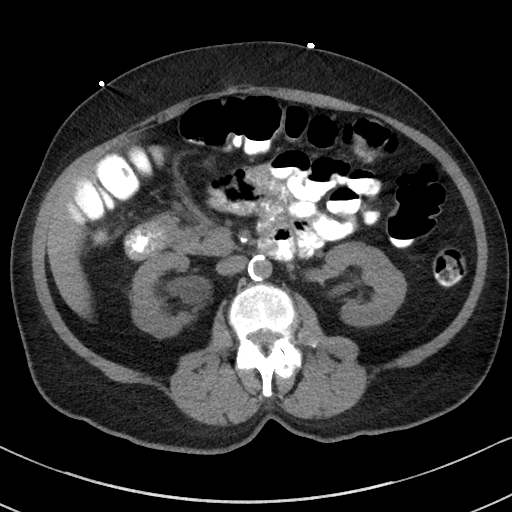
[im 57/88  soft-tissue]
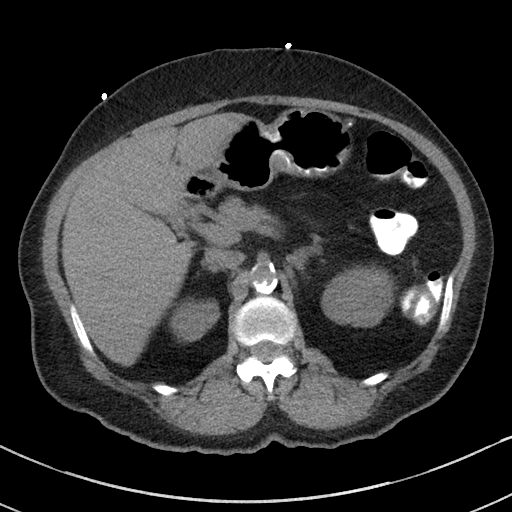
[im 57/88  bone]
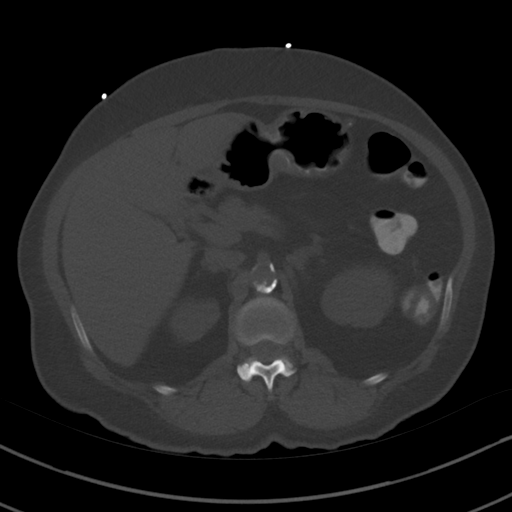
[im 65/88  soft-tissue]
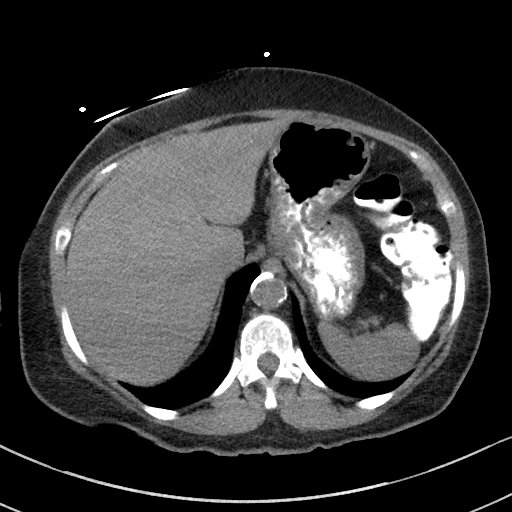
[im 69/88  soft-tissue]
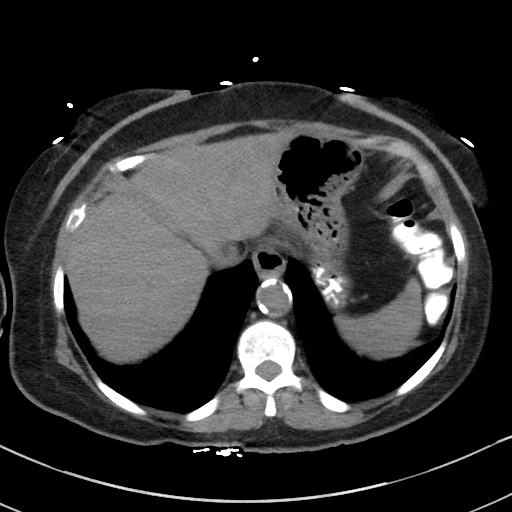
[im 76/88  soft-tissue]
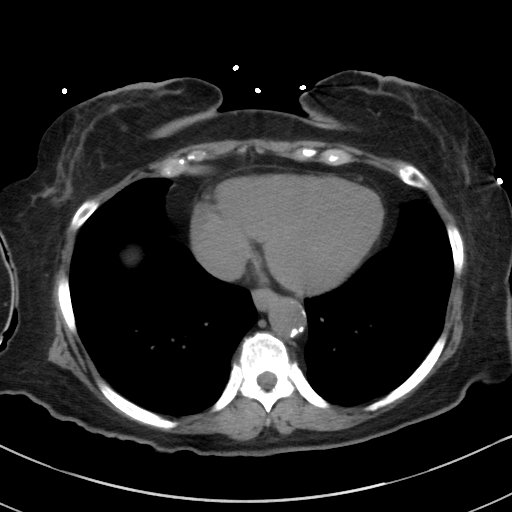
[im 84/88  soft-tissue]
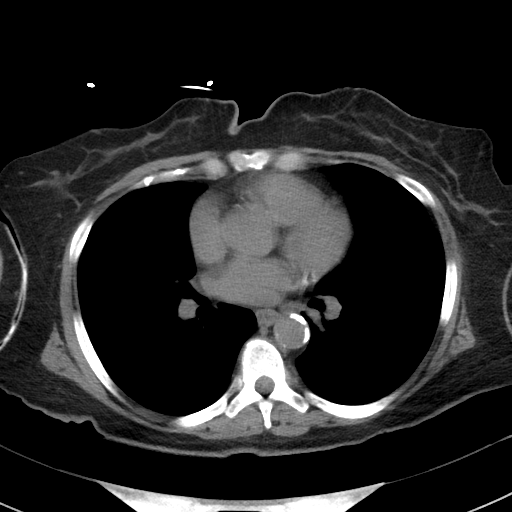

[Series 5: a/p w/o cor · coronal · non-contrast · 0.73mm/px · 3 of 125 slices shown]
[im 42/125  soft-tissue]
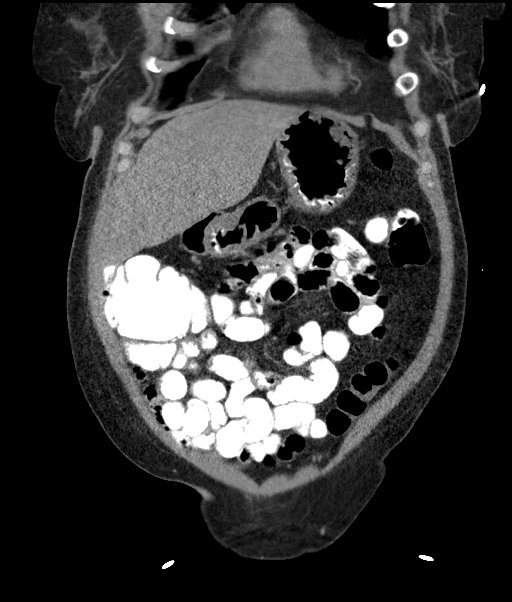
[im 56/125  soft-tissue]
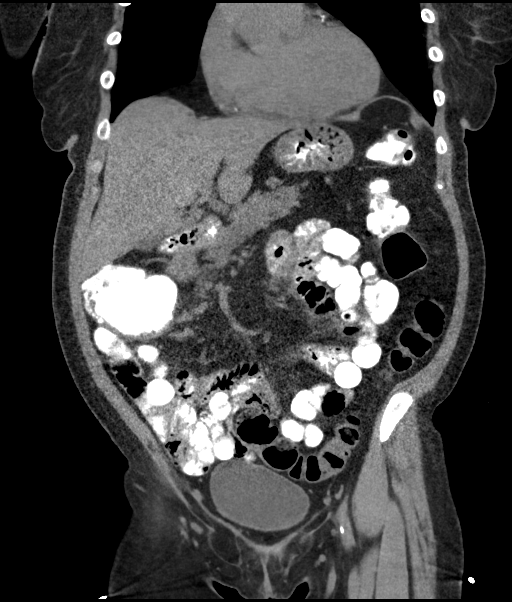
[im 69/125  soft-tissue]
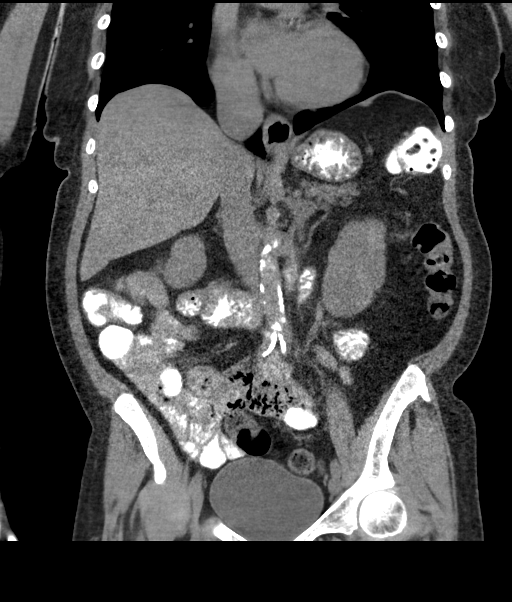

[16 of 46 positions shown; findings below may reference images not displayed]

FINDINGS: Minimal bibasilar atelectasis is noted. Diffuse coronary artery
calcifications are seen.

The liver and spleen are unremarkable in appearance. A stone is
noted within the gallbladder. The gallbladder is otherwise
unremarkable. The pancreas and adrenal glands are unremarkable.

Apparent mild right-sided hydronephrosis is noted, with mild right
renal atrophy, but no evidence of distal obstructing stone. This may
reflect an underlying stricture, or possibly a recently passed
stone. The left kidney is unremarkable in appearance. No renal or
ureteral stones are identified. No significant perinephric stranding
is seen.

No free fluid is identified. The small bowel is unremarkable in
appearance. Clumping of bowel at the upper pelvis is thought to be
transient in nature. The stomach is within normal limits. No acute
vascular abnormalities are seen. Scattered calcification is noted
along the abdominal aorta and its branches.

The appendix is not definitely characterized; there is no evidence
of appendicitis. The colon is unremarkable in appearance.

The bladder is moderately distended and grossly unremarkable. The
uterus is unremarkable in appearance. The ovaries are relatively
symmetric. No suspicious adnexal masses are seen. No inguinal
lymphadenopathy is seen.

No acute osseous abnormalities are identified. Facet disease is
noted along the lumbar spine.
IMPRESSION: 1. Apparent mild right-sided hydronephrosis, with mild right renal
atrophy, but no definite distal obstructing stone. This may reflect
an underlying stricture, or possibly a recently passed stone.
2. Scattered calcification along the abdominal aorta and its
branches.
3. Cholelithiasis.  Gallbladder otherwise unremarkable.
4. Diffuse coronary artery calcifications seen.

## 2017-11-01 IMAGING — DX DG ABDOMEN 2V
2 series · 2 of 2 positions shown · non-contrast
Comparison: CT 02/19/2015

CLINICAL DATA: Abdominal pain for a few days

EXAM:
ABDOMEN - 2 VIEW

[abdomen supine]
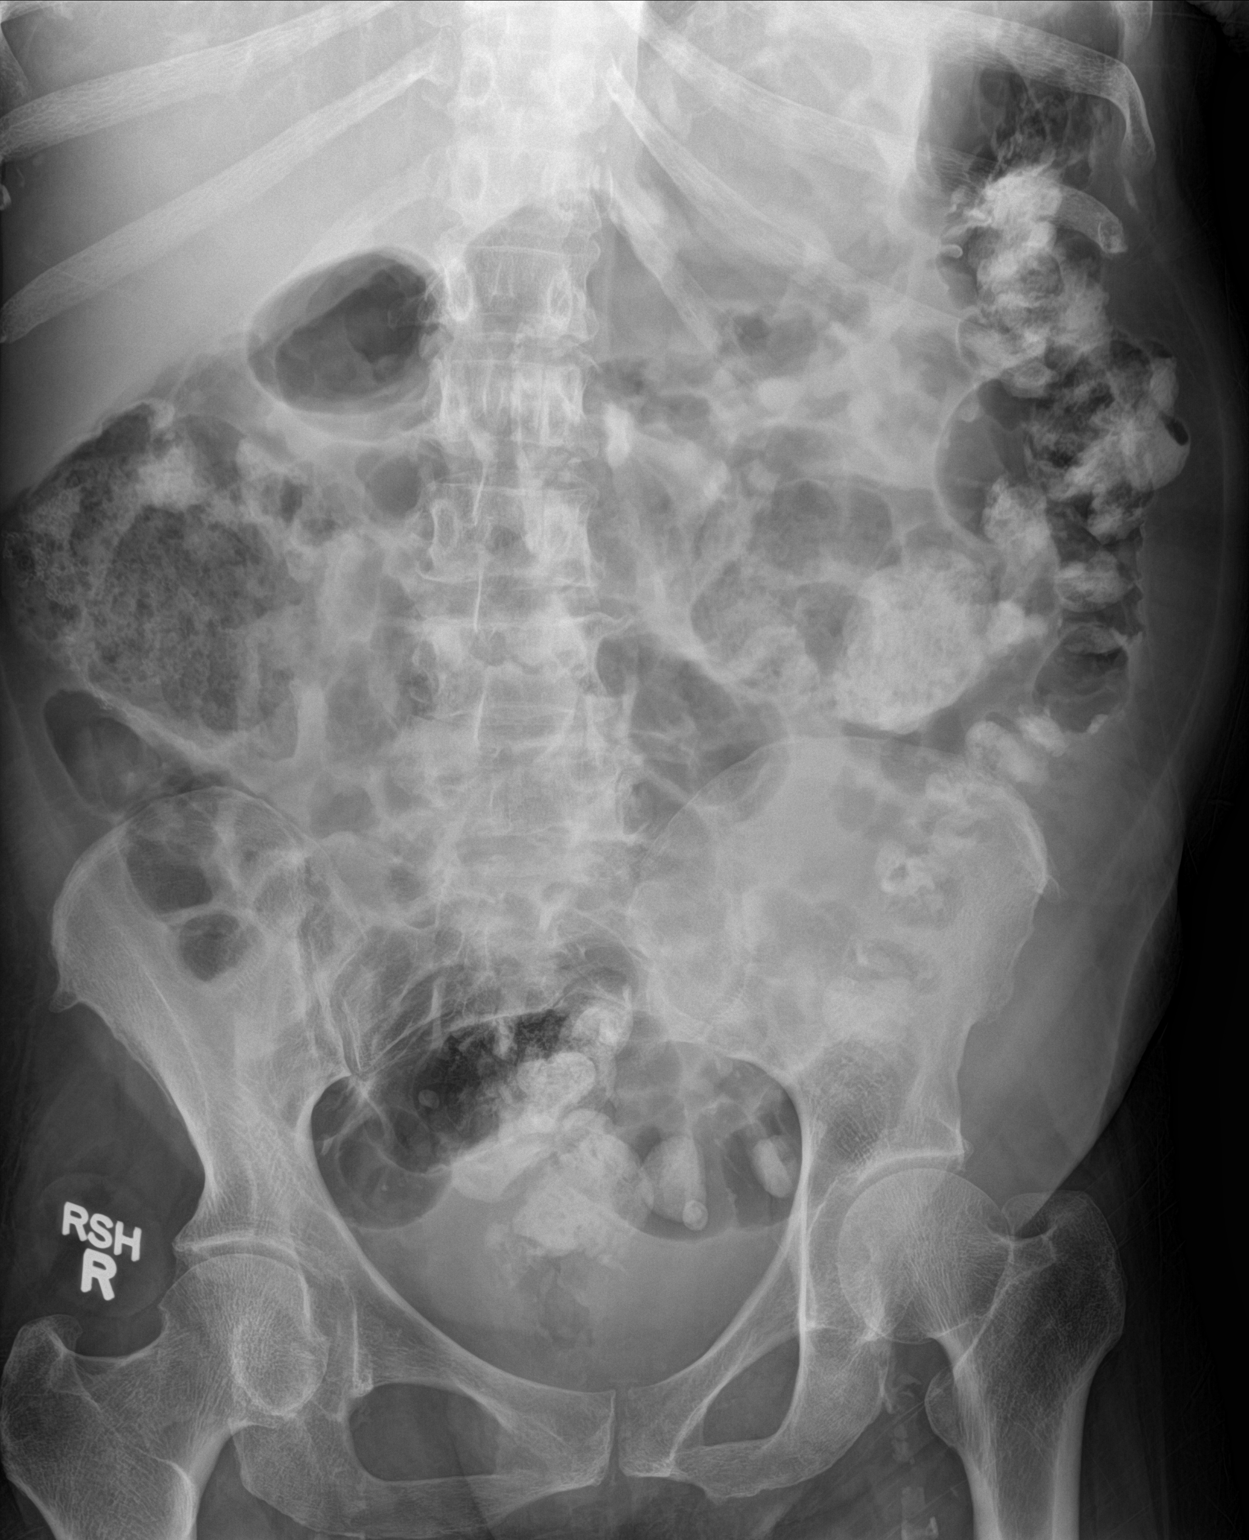

[abdomen decu]
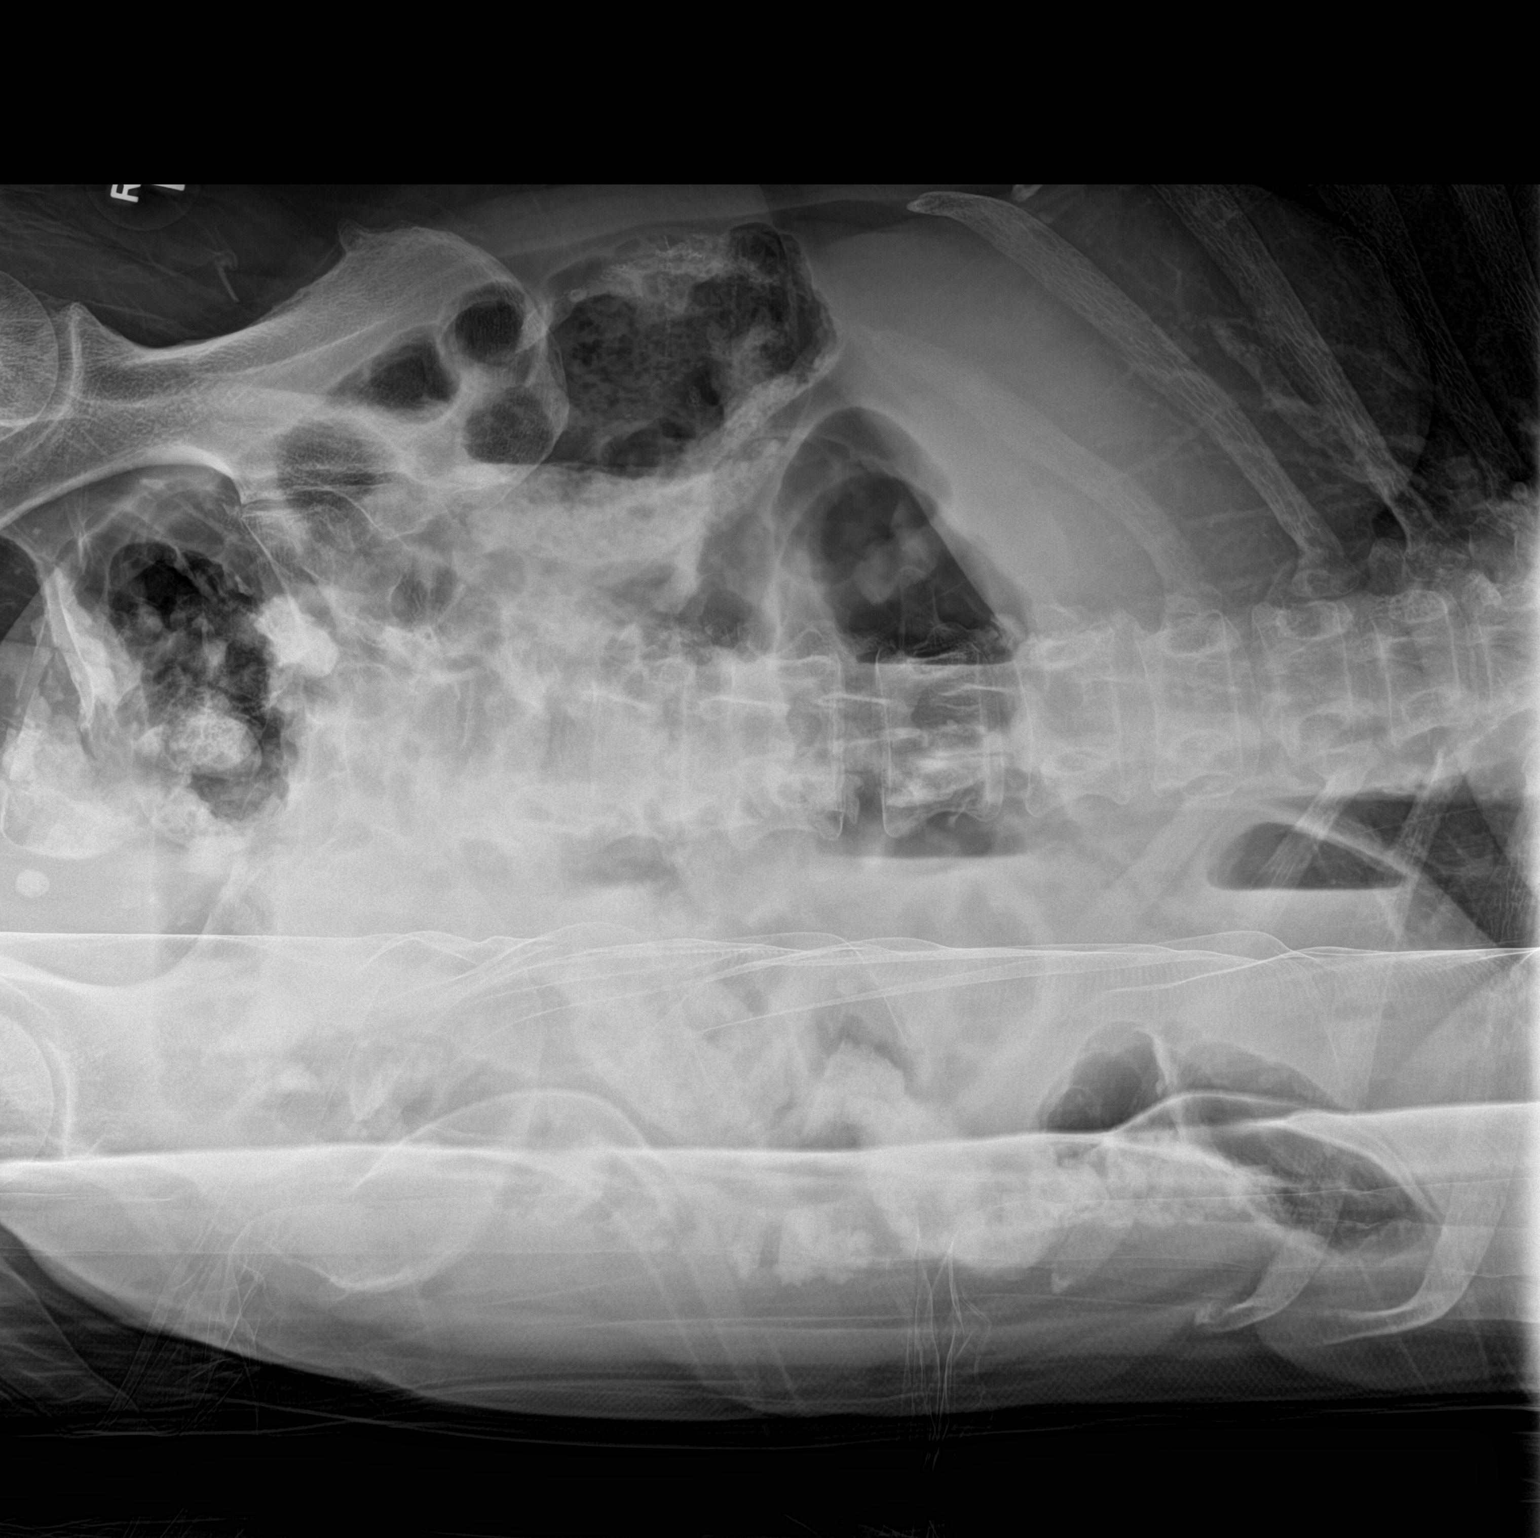

[2 of 2 positions shown; findings below may reference images not displayed]

FINDINGS: Moderate stool burden throughout the colon. No evidence of bowel
obstruction or free air. No organomegaly. Oral contrast material
noted within the left side of the colon. No acute bony abnormality.
IMPRESSION: Moderate stool burden.  No obstruction or free air.

## 2017-11-05 ENCOUNTER — Ambulatory Visit (INDEPENDENT_AMBULATORY_CARE_PROVIDER_SITE_OTHER): Payer: Medicare Other | Admitting: Pulmonary Disease

## 2017-11-05 ENCOUNTER — Encounter: Payer: Self-pay | Admitting: Pulmonary Disease

## 2017-11-05 ENCOUNTER — Ambulatory Visit (INDEPENDENT_AMBULATORY_CARE_PROVIDER_SITE_OTHER)
Admission: RE | Admit: 2017-11-05 | Discharge: 2017-11-05 | Disposition: A | Discharge status: Home / Self Care | Payer: Medicare Other | Source: Ambulatory Visit | Attending: Pulmonary Disease | Admitting: Pulmonary Disease

## 2017-11-05 VITALS — BP 144/78 | HR 79 | Ht 59.0 in | Wt 130.8 lb

## 2017-11-05 DIAGNOSIS — R918 Other nonspecific abnormal finding of lung field: Secondary | ICD-10-CM | POA: Diagnosis not present

## 2017-11-05 DIAGNOSIS — R0602 Shortness of breath: Secondary | ICD-10-CM | POA: Diagnosis not present

## 2017-11-05 HISTORY — PX: MASTECTOMY: SHX3

## 2017-11-05 NOTE — Addendum Note (Signed)
Addended by: Maryanna Shape A on: 11/05/2017 11:47 AM   Modules accepted: Orders

## 2017-11-05 NOTE — Patient Instructions (Signed)
Your lung function does not show any clear evidence of COPD Continue the inhalers We will clear you for surgery and let his surgeon know on our recommendation Follow-up in 6 months.

## 2017-11-05 NOTE — Progress Notes (Signed)
[name]    [id number]    06/29/1950  Primary Care Physician:Bland, Myra Rude, MD  Referring Physician: Lucianne Lei, MD [address] [place], [place] [address]  Chief complaint:   Preop clearance for mastectomy  HPI: 67 year old with past history of coronary artery disease, diabetes, ex-smoker.  She has a recent diagnosis of breast cancer on the right.  She is being evaluated by surgery for mastectomy.  PCCM preop clearance requested given her smoking history  She has been evaluated in the pulmonary clinic in 2013 for cough.  Spirometry at that time did not show any obstruction or evidence of COPD Complains of dyspnea on exertion, occasional cough with mucus production, no wheezing, fevers, chills.  She is currently maintained on Symbicort inhaler and albuterol as needed.  Recent history noted for carpal tunnel release surgery in August 2019 under general anesthesia which is tolerated well.  Pets: No pets Occupation: Retired Chartered certified accountant, Scientist, water quality Exposures: No known exposures, no mold, hot tub Smoking history: 34-pack-year smoker.  Quit in 2012 Travel history: Originally from [place].  No significant travel Relevant family history: No significant family history of lung disease.  Outpatient Encounter Medications as of 11/05/2017  Medication Sig  . albuterol (PROVENTIL) (2.5 MG/3ML) 0.083% nebulizer solution Take 2.5 mg by nebulization 2 (two) times daily.  Marland Kitchen ALPRAZolam (XANAX) 0.25 MG tablet Take 0.25 mg by mouth 2 (two) times daily.   Marland Kitchen amLODipine (NORVASC) 5 MG tablet Take 5 mg by mouth daily.  Marland Kitchen aspirin EC 81 MG EC tablet Take 1 tablet (81 mg total) by mouth daily.  Marland Kitchen atorvastatin (LIPITOR) 20 MG tablet Take 20 mg by mouth daily.  . budesonide-formoterol (SYMBICORT) 160-4.5 MCG/ACT inhaler Inhale 2 puffs into the lungs 2 (two) times daily.  . feeding supplement, ENSURE ENLIVE, (ENSURE ENLIVE) LIQD Take 237 mLs by mouth 2 (two) times daily between meals.  .  Insulin Glargine (LANTUS SOLOSTAR) 100 UNIT/ML Solostar Pen Inject 20 Units into the skin daily at 10 pm. (Patient taking differently: Inject 40 Units into the skin 1 day or 1 dose. )  . loperamide (IMODIUM) 2 MG capsule Take 1 capsule (2 mg total) by mouth 4 (four) times daily as needed for diarrhea or loose stools.  . metFORMIN (GLUCOPHAGE) 1000 MG tablet Take 500 mg by mouth 2 (two) times daily with a meal.   . metoprolol succinate (TOPROL-XL) 50 MG 24 hr tablet Take 50 mg by mouth every evening. Take with or immediately following a meal.  . nitroGLYCERIN (NITROSTAT) 0.4 MG SL tablet Place 1 tablet (0.4 mg total) under the tongue every 5 (five) minutes as needed. For chest pain (Patient taking differently: Place 0.4 mg under the tongue every 5 (five) minutes as needed for chest pain. )  . pantoprazole (PROTONIX) 40 MG tablet Take 1 tablet (40 mg total) by mouth daily.  . cyclobenzaprine (FLEXERIL) 10 MG tablet Take 1 tablet (10 mg total) by mouth 2 (two) times daily as needed for muscle spasms. (Patient not taking: Reported on 11/05/2017)  . HYDROcodone-acetaminophen (NORCO) 5-325 MG tablet 1-2 tabs po q6 hours prn pain (Patient not taking: Reported on 11/05/2017)  . HYDROcodone-acetaminophen (NORCO) 5-325 MG tablet 1-2 tabs po q6 hours prn pain (Patient not taking: Reported on 11/05/2017)   No facility-administered encounter medications on file as of 11/05/2017.     Allergies as of 11/05/2017  . (No Known Allergies)    Past Medical History:  Diagnosis  Date  . Angina   . Arthritis    HANDS"  . Asthma   . Cancer ([place]) 09/04/2017   Right Breast  . Carpal tunnel syndrome, bilateral 11/26/2016  . Cholelithiasis   . Cocaine abuse ([place]) 07/2012   per E.R. drug screen  . Coronary artery disease   . Diabetes mellitus without mention of complication    type 2  . Dysphagia    esophageal dysmotility on 03/2011 esophagram   . Fatty liver disease, nonalcoholic 5643   on Imaging.   Marland Kitchen GERD  (gastroesophageal reflux disease)   . History of colonic polyps 2009   adenomatous 2009, HP in 2009, 2011, 2013.   Marland Kitchen Hyperlipidemia   . Hypertension   . Iron deficiency anemia   . Myocardial infarction ([place])    years ago, 6 stents placed  . 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 ABDOMINAL ANGIOGRAM  02/20/2011   Procedure: ABDOMINAL ANGIOGRAM;  Surgeon: Clent Demark, MD;  Location: [facility] CATH LAB;  Service: Cardiovascular;;  . CARPAL TUNNEL RELEASE Right 07/04/2017   Procedure: RIGHT CARPAL TUNNEL RELEASE;  Surgeon: Leanora Cover, MD;  Location: [facility];  Service: Orthopedics;  Laterality: Right;  . CARPAL TUNNEL RELEASE Left 09/12/2017   Procedure: LEFT CARPAL TUNNEL RELEASE;  Surgeon: Leanora Cover, MD;  Location: [facility] [place];  Service: Orthopedics;  Laterality: Left;  . COLONOSCOPY  11/30/2011   Procedure: COLONOSCOPY;  Surgeon: Lafayette Dragon, MD;  Location: [facility] ENDOSCOPY;  Service: Endoscopy;  Laterality: N/A;  . CORONARY ANGIOPLASTY WITH STENT PLACEMENT    . ESOPHAGOGASTRODUODENOSCOPY  11/30/2011   Procedure: ESOPHAGOGASTRODUODENOSCOPY (EGD);  Surgeon: Lafayette Dragon, MD;  Location: [facility];  Service: Endoscopy;  Laterality: N/A;  . ESOPHAGOGASTRODUODENOSCOPY N/A 02/23/2015   Procedure: ESOPHAGOGASTRODUODENOSCOPY (EGD);  Surgeon: Gatha Mayer, MD;  Location: [facility] ENDOSCOPY;  Service: Endoscopy;  Laterality: N/A;  . LEFT HEART CATHETERIZATION WITH CORONARY ANGIOGRAM N/A 02/20/2011   Procedure: LEFT HEART CATHETERIZATION WITH CORONARY ANGIOGRAM;  Surgeon: Clent Demark, MD;  Location: [place] CATH LAB;  Service: Cardiovascular;  Laterality: N/A;  . RIGHT COLECTOMY  02/2007   for post polypectomy colonic perforation  . TUBAL LIGATION      Family History  Problem Relation Age of Onset  . Heart disease Mother   . Diabetes Mother   . Cancer Father        unsure what kind  . Diabetes Sister   .  Colon cancer Neg Hx   . Breast cancer Neg Hx     Social History   Socioeconomic History  . Marital status: Divorced    Spouse name: Not on file  . Number of children: 2  . Years of education: Not on file  . Highest education level: Not on file  Occupational History  . Occupation: retired. prev worked in housekeeping w/ [employer]  . Financial resource strain: Not on file  . Food insecurity:    Worry: Not on file    Inability: Not on file  . Transportation needs:    Medical: Not on file    Non-medical: Not on file  Tobacco Use  . Smoking status: Former Smoker    Packs/day: 0.50    Years: 47.00    Pack years: 23.50    Types: Cigarettes    Last attempt to quit: 05/09/2010    Years since quitting: 7.4  . Smokeless tobacco: Never Used  Substance and Sexual Activity  . Alcohol use: No  . Drug use: Not Currently    Types: Cocaine    Comment: positive drug screen (cocaine, benzo's)07/2012 in emergency room  . Sexual activity: Never    Birth control/protection: Post-menopausal  Lifestyle  . Physical activity:    Days per week: Not on file    Minutes per session: Not on file  . Stress: Not on file  Relationships  . Social connections:    Talks on phone: Not on file    Gets together: Not on file    Attends religious service: Not on file    Active member of club or organization: Not on file    Attends meetings of clubs or organizations: Not on file    Relationship status: Not on file  . Intimate partner violence:    Fear of current or ex partner: Not on file    Emotionally abused: Not on file    Physically abused: Not on file    Forced sexual activity: Not on file  Other Topics Concern  . Not on file  Social History Narrative   Caffeine use daily.    Review of systems: Review of Systems  Constitutional: Negative for fever and chills.  HENT: Negative.   Eyes: Negative for blurred vision.  Respiratory: as per HPI  Cardiovascular: Negative for chest pain  and palpitations.  Gastrointestinal: Negative for vomiting, diarrhea, blood per rectum. Genitourinary: Negative for dysuria, urgency, frequency and hematuria.  Musculoskeletal: Negative for myalgias, back pain and joint pain.  Skin: Negative for itching and rash.  Neurological: Negative for dizziness, tremors, focal weakness, seizures and loss of consciousness.  Endo/Heme/Allergies: Negative for environmental allergies.  Psychiatric/Behavioral: Negative for depression, suicidal ideas and hallucinations.  All other systems reviewed and are negative.  Physical Exam: Blood pressure (!) 144/78, pulse 79, height 4\' 11"  (1.499 m), weight 130 lb 12.8 oz (59.3 kg), SpO2 99 %. Gen:      No acute distress HEENT:  EOMI, sclera anicteric Neck:     No masses; no thyromegaly Lungs:    Clear to auscultation bilaterally; normal respiratory effort CV:         Regular rate and rhythm; no murmurs Abd:      + bowel sounds; soft, non-tender; no palpable masses, no distension Ext:    No edema; adequate peripheral perfusion Skin:      Warm and dry; no rash Neuro: alert and oriented x 3 Psych: normal mood and affect  Data Reviewed: Imaging: CT abdomen 02/19/2015-minimal basilar atelectasis Chest x-ray 02/27/2015-no acute cardiopulmonary disease I reviewed the images personally.  PFTs: 08/06/2011 FVC 1.94 [76%), FEV1 1.65 [90%], F/F 85, TLC 81%, DLCO 52% Moderate decrease in diffusion capacity.  No obstruction or restriction  Spirometry 11/05/2017 FVC 2.1 [66%), FEV1 1.9 [79%], F/F 89% No obstruction.  Assessment:  Preop clearance for breast surgery She has significant smoking history but no evidence of COPD.  Reviewed PFTs with no obstruction She has tolerated recent surgery under general anesthesia in September Cleared for breast surgery with low risk of perioperative complications  Recommend continuing inhalers, early mobilization, use of incentive spirometry.  Plan/Recommendations: - Cleared for  breast surgery - Continue inhalers, early mobilization, incentive spirometry postop.  Marshell Garfinkel MD [facility] 11/05/2017, 11:18 AM  CC: Lucianne Lei, MD

## 2017-11-08 ENCOUNTER — Ambulatory Visit: Payer: Self-pay | Admitting: General Surgery

## 2017-11-08 ENCOUNTER — Telehealth: Payer: Self-pay | Admitting: Hematology and Oncology

## 2017-11-08 DIAGNOSIS — C50811 Malignant neoplasm of overlapping sites of right female breast: Secondary | ICD-10-CM

## 2017-11-08 NOTE — Telephone Encounter (Signed)
Scheduled appt per 10/4 sch message - pt is niece is aware of appt date and time.

## 2017-11-11 ENCOUNTER — Telehealth: Payer: Self-pay | Admitting: Hematology and Oncology

## 2017-11-11 NOTE — Telephone Encounter (Signed)
Called regarding 10/31

## 2017-11-12 ENCOUNTER — Inpatient Hospital Stay (HOSPITAL_COMMUNITY)
Admission: RE | Admit: 2017-11-12 | Discharge: 2017-11-12 | Disposition: A | Discharge status: Home / Self Care | Payer: Self-pay | Source: Ambulatory Visit

## 2017-11-14 ENCOUNTER — Other Ambulatory Visit: Payer: Self-pay | Admitting: *Deleted

## 2017-11-14 ENCOUNTER — Telehealth: Payer: Self-pay | Admitting: Hematology and Oncology

## 2017-11-14 DIAGNOSIS — C50411 Malignant neoplasm of upper-outer quadrant of right female breast: Secondary | ICD-10-CM

## 2017-11-14 DIAGNOSIS — Z170 Estrogen receptor positive status [ER+]: Principal | ICD-10-CM

## 2017-11-14 NOTE — Telephone Encounter (Signed)
Talk with patient niece she didn't under why she needed class told her to talk with nurse

## 2017-11-19 ENCOUNTER — Other Ambulatory Visit (HOSPITAL_COMMUNITY): Payer: Self-pay

## 2017-11-19 NOTE — Pre-Procedure Instructions (Signed)
[name]  11/19/2017      [facility] [place], [place] [phone] [place] [address] Phone: [phone] Fax: [phone]    Your procedure is scheduled on November 27, 2017.  Report to [facility] Admitting at 1030 AM.  Call this number if you have problems the morning of surgery:  [phone]   Remember:  Do not eat or drink after midnight.    Take these medicines the morning of surgery with A SIP OF WATER  Amlodipine (norvasc) Alprazolam (xanax) Pantoprazole (protonix) Symbicort inhaler Albuterol nebulizer-if needed  Follow your surgeon's instructions on when to hold/resume aspirin.  If no instructions were given call the office to determine how they would like to you take aspirin  7 days prior to surgery STOP taking any Aleve, Naproxen, Ibuprofen, Motrin, Advil, Goody's, BC's, all herbal medications, fish oil, and all vitamins   WHAT DO I DO ABOUT MY DIABETES MEDICATION?   Marland Kitchen Do not take oral diabetes medicines (pills) the morning of surgery--metformin (glucophage).  . THE NIGHT BEFORE SURGERY, take 20 units of lantus insulin (1/2 of your normal dose).      . THE MORNING OF SURGERY, take 20 units of lantus insulin (1/2 of your normal dose).     . The day of surgery, do not take other diabetes injectables, including Byetta (exenatide), Bydureon (exenatide ER), Victoza (liraglutide), or Trulicity (dulaglutide).  . If your CBG is greater than 220 mg/dL, you may take  of your sliding scale (correction) dose of insulin.  Reviewed and Endorsed by [facility] Patient Education Committee, August 2015  How to Manage Your Diabetes Before and After Surgery  Why is it important to control my blood sugar before and after surgery? . Improving blood sugar levels before and after surgery helps healing and can limit problems. . A way of improving blood sugar control is eating a healthy diet by: o  Eating  less sugar and carbohydrates o  Increasing activity/exercise o  Talking with your doctor about reaching your blood sugar goals . High blood sugars (greater than 180 mg/dL) can raise your risk of infections and slow your recovery, so you will need to focus on controlling your diabetes during the weeks before surgery. . Make sure that the doctor who takes care of your diabetes knows about your planned surgery including the date and location.  How do I manage my blood sugar before surgery? . Check your blood sugar at least 4 times a day, starting 2 days before surgery, to make sure that the level is not too high or low. o Check your blood sugar the morning of your surgery when you wake up and every 2 hours until you get to the Short Stay unit. . If your blood sugar is less than 70 mg/dL, you will need to treat for low blood sugar: o Do not take insulin. o Treat a low blood sugar (less than 70 mg/dL) with  cup of clear juice (cranberry or apple), 4 glucose tablets, OR glucose gel. Recheck blood sugar in 15 minutes after treatment (to make sure it is greater than 70 mg/dL). If your blood sugar is not greater than 70 mg/dL on recheck, call [phone] o  for further instructions. . Report your blood sugar to the short stay nurse when you get to Short Stay.  . If you are admitted to the hospital after surgery: o Your blood sugar will be checked by  the staff and you will probably be given insulin after surgery (instead of oral diabetes medicines) to make sure you have good blood sugar levels. o The goal for blood sugar control after surgery is 80-180 mg/dL.   [name]- Preparing For Surgery  Before surgery, you can play an important role. Because skin is not sterile, your skin needs to be as free of germs as possible. You can reduce the number of germs on your skin by washing with CHG (chlorahexidine gluconate) Soap before surgery.  CHG is an antiseptic cleaner which kills germs and bonds with  the skin to continue killing germs even after washing.    Oral Hygiene is also important to reduce your risk of infection.  Remember - BRUSH YOUR TEETH THE MORNING OF SURGERY WITH YOUR REGULAR TOOTHPASTE  Please do not use if you have an allergy to CHG or antibacterial soaps. If your skin becomes reddened/irritated stop using the CHG.  Do not shave (including legs and underarms) for at least 48 hours prior to first CHG shower. It is OK to shave your face.  Please follow these instructions carefully.   1. Shower the NIGHT BEFORE SURGERY and the MORNING OF SURGERY with CHG.   2. If you chose to wash your hair, wash your hair first as usual with your normal shampoo.  3. After you shampoo, rinse your hair and body thoroughly to remove the shampoo.  4. Use CHG as you would any other liquid soap. You can apply CHG directly to the skin and wash gently with a scrungie or a clean washcloth.   5. Apply the CHG Soap to your body ONLY FROM THE NECK DOWN.  Do not use on open wounds or open sores. Avoid contact with your eyes, ears, mouth and genitals (private parts). Wash Face and genitals (private parts)  with your normal soap.  6. Wash thoroughly, paying special attention to the area where your surgery will be performed.  7. Thoroughly rinse your body with warm water from the neck down.  8. DO NOT shower/wash with your normal soap after using and rinsing off the CHG Soap.  9. Pat yourself dry with a CLEAN TOWEL.  10. Wear CLEAN PAJAMAS to bed the night before surgery, wear comfortable clothes the morning of surgery  11. Place CLEAN SHEETS on your bed the night of your first shower and DO NOT SLEEP WITH PETS.  Day of Surgery:  Do not apply any deodorants/lotions.  Please wear clean clothes to the hospital/surgery center.   Remember to brush your teeth WITH YOUR REGULAR TOOTHPASTE.    Do not wear jewelry, make-up or nail polish.  Do not wear lotions, powders, or perfumes, or deodorant.  Do  not shave 48 hours prior to surgery.  Do not bring valuables to the hospital.  [facility] is not responsible for any belongings or valuables.  Contacts, dentures or bridgework may not be worn into surgery.  Leave your suitcase in the car.  After surgery it may be brought to your room.  For patients admitted to the hospital, discharge time will be determined by your treatment team.  Patients discharged the day of surgery will not be allowed to drive home.   Please read over the following fact sheets that you were given.

## 2017-11-19 NOTE — Progress Notes (Addendum)
PCP: Beatrix Shipper, MD  Cardiologist: Dr. Gerilyn Pilgrim the past, no one currently  EKG: 09/12/17 in EPIC  Stress test: 04/20/16 in EPIC  ECHO: 11/14/17 in EPIC  Cardiac Cath: 2013 at [facility]  Chest x-ray: 11/05/17 in Epic  Patient is on aspirin 81 mg- has not received instructions on when to hold, will call Dr. Lear Ng office ASAP

## 2017-11-20 ENCOUNTER — Encounter (HOSPITAL_COMMUNITY): Payer: Self-pay

## 2017-11-20 ENCOUNTER — Other Ambulatory Visit: Payer: Self-pay

## 2017-11-20 ENCOUNTER — Encounter (HOSPITAL_COMMUNITY)
Admission: RE | Admit: 2017-11-20 | Discharge: 2017-11-20 | Disposition: A | Discharge status: Home / Self Care | Payer: Medicare Other | Source: Ambulatory Visit | Attending: General Surgery | Admitting: General Surgery

## 2017-11-20 DIAGNOSIS — Z01818 Encounter for other preprocedural examination: Secondary | ICD-10-CM | POA: Insufficient documentation

## 2017-11-20 DIAGNOSIS — Z79899 Other long term (current) drug therapy: Secondary | ICD-10-CM | POA: Diagnosis not present

## 2017-11-20 DIAGNOSIS — I2510 Atherosclerotic heart disease of native coronary artery without angina pectoris: Secondary | ICD-10-CM | POA: Diagnosis not present

## 2017-11-20 DIAGNOSIS — C50911 Malignant neoplasm of unspecified site of right female breast: Secondary | ICD-10-CM | POA: Diagnosis not present

## 2017-11-20 DIAGNOSIS — Z794 Long term (current) use of insulin: Secondary | ICD-10-CM | POA: Diagnosis not present

## 2017-11-20 DIAGNOSIS — Z7982 Long term (current) use of aspirin: Secondary | ICD-10-CM | POA: Insufficient documentation

## 2017-11-20 DIAGNOSIS — K219 Gastro-esophageal reflux disease without esophagitis: Secondary | ICD-10-CM | POA: Diagnosis not present

## 2017-11-20 DIAGNOSIS — Z7951 Long term (current) use of inhaled steroids: Secondary | ICD-10-CM | POA: Insufficient documentation

## 2017-11-20 DIAGNOSIS — D509 Iron deficiency anemia, unspecified: Secondary | ICD-10-CM | POA: Insufficient documentation

## 2017-11-20 DIAGNOSIS — E119 Type 2 diabetes mellitus without complications: Secondary | ICD-10-CM | POA: Insufficient documentation

## 2017-11-20 DIAGNOSIS — E785 Hyperlipidemia, unspecified: Secondary | ICD-10-CM | POA: Insufficient documentation

## 2017-11-20 DIAGNOSIS — I252 Old myocardial infarction: Secondary | ICD-10-CM | POA: Diagnosis not present

## 2017-11-20 DIAGNOSIS — I10 Essential (primary) hypertension: Secondary | ICD-10-CM | POA: Diagnosis not present

## 2017-11-20 LAB — HEMOGLOBIN A1C
Hgb A1c MFr Bld: 6.3 % — ABNORMAL HIGH (ref 4.8–5.6)
Mean Plasma Glucose: 134.11 mg/dL

## 2017-11-20 LAB — BASIC METABOLIC PANEL
Anion gap: 9 (ref 5–15)
BUN: 18 mg/dL (ref 8–23)
CHLORIDE: 106 mmol/L (ref 98–111)
CO2: 22 mmol/L (ref 22–32)
Calcium: 9.7 mg/dL (ref 8.9–10.3)
Creatinine, Ser: 1.89 mg/dL — ABNORMAL HIGH (ref 0.44–1.00)
GFR calc Af Amer: 30 mL/min — ABNORMAL LOW (ref >60)
GFR calc non Af Amer: 26 mL/min — ABNORMAL LOW (ref >60)
GLUCOSE: 111 mg/dL — AB (ref 70–99)
POTASSIUM: 4.3 mmol/L (ref 3.5–5.1)
SODIUM: 137 mmol/L (ref 135–145)

## 2017-11-20 LAB — CBC
HCT: 40.6 % (ref 36.0–46.0)
HEMOGLOBIN: 11.6 g/dL — AB (ref 12.0–15.0)
MCH: 26.4 pg (ref 26.0–34.0)
MCHC: 28.6 g/dL — ABNORMAL LOW (ref 30.0–36.0)
MCV: 92.3 fL (ref 80.0–100.0)
Platelets: 382 10*3/uL (ref 150–400)
RBC: 4.4 MIL/uL (ref 3.87–5.11)
RDW: 14.6 % (ref 11.5–15.5)
WBC: 6.3 10*3/uL (ref 4.0–10.5)
nRBC: 0.3 % — ABNORMAL HIGH (ref 0.0–0.2)

## 2017-11-21 ENCOUNTER — Telehealth: Payer: Self-pay | Admitting: *Deleted

## 2017-11-21 NOTE — Progress Notes (Signed)
Anesthesia Chart Review:  Case:  [id number] Date/Time:  11/27/17 1215   Procedures:      RIGHT TOTAL MASTECTOMY WITH SENTINEL LYMPH NODE BIOPSY (Right Breast)     INSERTION PORT-A-CATH (N/A )   Anesthesia type:  General   Pre-op diagnosis:  RIGHT BREAST CANCER   Location:  [facility] OR ROOM 02 / [facility]   Surgeon:  Excell Seltzer, MD      DISCUSSION:67 yo female former smoker. Pertinent hx includes CAD, Remote MI with stenting to left circumflex and RCA, DMII, CKD, GERD, HTN, NAFLD, Asthma, DOE.  Pulmonary clearance by Dr. Vaughan Browner 11/05/2017 "She has significant smoking history but no evidence of COPD.  Reviewed PFTs with no obstruction. She has tolerated recent surgery under general anesthesia in September. Cleared for breast surgery with low risk of perioperative complications. Recommend continuing inhalers, early mobilization, use of incentive spirometry."  Remote history of MI with stenting to left circumflex and RCA. Cath 2013 showed patent stents. No recent cardiac followup.  Given recent carpal tunnel release under [place] without complication I anticipate she can proceed as planned barring acute status change.  VS: BP (!) 158/76   Pulse 66   Temp 36.7 C   Resp 20   Ht 4' 11.5" (1.511 m)   Wt 58.9 kg   SpO2 100%   BMI 25.80 kg/m    PROVIDERS: Lucianne Lei, MD is PCP  Marshell Garfinkel, MD is Pulmonologist  LABS: Labs reviewed: Acceptable for surgery. Elevated creatinine. Review of history shows stable. Results for [name], [name] (MRN [id number]) as of 11/21/2017 13:49  Ref. Range 04/20/2016 07:30 07/04/2017 09:32 07/04/2017 09:47 09/12/2017 08:38 11/20/2017 09:06  Creatinine Latest Ref Range: 0.44 - 1.00 mg/dL 1.55 (H) 1.50 (H) 1.60 (H) 1.90 (H) 1.89 (H)   (all labs ordered are listed, but only abnormal results are displayed)  Labs Reviewed  CBC - Abnormal; Notable for the following components:      Result Value   Hemoglobin 11.6 (*)    MCHC 28.6 (*)    nRBC 0.3 (*)    All other  components within normal limits  BASIC METABOLIC PANEL - Abnormal; Notable for the following components:   Glucose, Bld 111 (*)    Creatinine, Ser 1.89 (*)    GFR calc non Af Amer 26 (*)    GFR calc Af Amer 30 (*)    All other components within normal limits  HEMOGLOBIN A1C - Abnormal; Notable for the following components:   Hgb A1c MFr Bld 6.3 (*)    All other components within normal limits     IMAGES: CHEST - 2 VIEW 11/05/2017  COMPARISON:  PA and lateral chest x-ray of Jun 06, 2014  FINDINGS: The lungs are adequately inflated. There is no focal infiltrate. There is no pleural effusion. The heart and pulmonary vascularity are normal. There is calcification in the wall of the thoracic aorta. The bony thorax exhibits no acute abnormality.  IMPRESSION: There is no active cardiopulmonary disease.  Thoracic aortic atherosclerosis.   EKG: 09/12/2017: NSR. Rate 68  CV: Cath 02/20/2011: FINDINGS:  LV showed mild global hypokinesia, EF of approximately 50%. Left main was patent.  LAD has 10-15% proximal stenosis.  Diagonal 1 has 20% ostial stenosis.  Diagonal 2 is very small.  Left circumflex has 40- 50% proximal sequential stenosis.  Stented midportion is patent.  OM 1 and OM 2 were very, very small.  OM 3 and 4 were small which were patent.  RCA was patent  at prior PTCA stenting site.  Aortography showed no aortic aneurysm.  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  Left renal artery is patent.  The patient tolerated the procedure well.  There were no complications.  The patient was transferred to recovery room in stable condition.  Past Medical History:  Diagnosis Date  . Angina   . Arthritis    HANDS"  . Asthma   . Cancer ([place]) 09/04/2017   Right Breast  . Carpal tunnel syndrome, bilateral 11/26/2016  . Cholelithiasis   . Cocaine abuse ([place]) 07/2012   per E.R. drug screen  . Coronary artery disease   . Diabetes mellitus without mention of complication     type 2  . Dysphagia    esophageal dysmotility on 03/2011 esophagram   . Fatty liver disease, nonalcoholic 4008   on Imaging.   Marland Kitchen GERD (gastroesophageal reflux disease)   . History of colonic polyps 2009   adenomatous 2009, HP in 2009, 2011, 2013.   Marland Kitchen Hyperlipidemia   . Hypertension   . Iron deficiency anemia   . Myocardial infarction ([place])    years ago, 6 stents placed  . 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 ABDOMINAL ANGIOGRAM  02/20/2011   Procedure: ABDOMINAL ANGIOGRAM;  Surgeon: Clent Demark, MD;  Location: [facility] CATH LAB;  Service: Cardiovascular;;  . CARPAL TUNNEL RELEASE Right 07/04/2017   Procedure: RIGHT CARPAL TUNNEL RELEASE;  Surgeon: Leanora Cover, MD;  Location: [place];  Service: Orthopedics;  Laterality: Right;  . CARPAL TUNNEL RELEASE Left 09/12/2017   Procedure: LEFT CARPAL TUNNEL RELEASE;  Surgeon: Leanora Cover, MD;  Location: [place];  Service: Orthopedics;  Laterality: Left;  . COLONOSCOPY  11/30/2011   Procedure: COLONOSCOPY;  Surgeon: Lafayette Dragon, MD;  Location: [facility] ENDOSCOPY;  Service: Endoscopy;  Laterality: N/A;  . CORONARY ANGIOPLASTY WITH STENT PLACEMENT    . ESOPHAGOGASTRODUODENOSCOPY  11/30/2011   Procedure: ESOPHAGOGASTRODUODENOSCOPY (EGD);  Surgeon: Lafayette Dragon, MD;  Location: [facility];  Service: Endoscopy;  Laterality: N/A;  . ESOPHAGOGASTRODUODENOSCOPY N/A 02/23/2015   Procedure: ESOPHAGOGASTRODUODENOSCOPY (EGD);  Surgeon: Gatha Mayer, MD;  Location: [facility] ENDOSCOPY;  Service: Endoscopy;  Laterality: N/A;  . LEFT HEART CATHETERIZATION WITH CORONARY ANGIOGRAM N/A 02/20/2011   Procedure: LEFT HEART CATHETERIZATION WITH CORONARY ANGIOGRAM;  Surgeon: Clent Demark, MD;  Location: [place] [facility] CATH LAB;  Service: Cardiovascular;  Laterality: N/A;  . RIGHT COLECTOMY  02/2007   for post polypectomy colonic perforation  . TUBAL LIGATION      MEDICATIONS: .  albuterol (PROVENTIL) (2.5 MG/3ML) 0.083% nebulizer solution  . ALPRAZolam (XANAX) 0.25 MG tablet  . amLODipine (NORVASC) 5 MG tablet  . aspirin EC 81 MG EC tablet  . atorvastatin (LIPITOR) 20 MG tablet  . budesonide-formoterol (SYMBICORT) 160-4.5 MCG/ACT inhaler  . cyclobenzaprine (FLEXERIL) 10 MG tablet  . feeding supplement, ENSURE ENLIVE, (ENSURE ENLIVE) LIQD  . HYDROcodone-acetaminophen (NORCO) 5-325 MG tablet  . HYDROcodone-acetaminophen (NORCO) 5-325 MG tablet  . Insulin Glargine (LANTUS SOLOSTAR) 100 UNIT/ML Solostar Pen  . loperamide (IMODIUM) 2 MG capsule  . metFORMIN (GLUCOPHAGE) 1000 MG tablet  . metoprolol succinate (TOPROL-XL) 50 MG 24 hr tablet  . nitroGLYCERIN (NITROSTAT) 0.4 MG SL tablet  . Omega-3 Fatty Acids (FISH OIL) 1000 MG CAPS  . pantoprazole (PROTONIX) 40 MG tablet   No current facility-administered medications for this encounter.     Karoline Caldwell, PA-C [facility] Short  Stay Center/Anesthesiology Phone [phone] 11/21/2017 1:51 PM

## 2017-11-21 NOTE — Telephone Encounter (Signed)
Called and spoke to patient's niece about her cancelling the echo appointment.  Informed her that she needs the echo prior to starting herceptin treatment and that she will be monitored with an echo every 3 months during therapy.  Rescheduled and confirmed with the niece for echo on 10/22 at 10am at [facility].

## 2017-11-26 ENCOUNTER — Ambulatory Visit (HOSPITAL_COMMUNITY)
Admission: RE | Admit: 2017-11-26 | Discharge: 2017-11-26 | Disposition: A | Discharge status: Home / Self Care | Payer: Medicare Other | Source: Ambulatory Visit | Attending: Hematology and Oncology | Admitting: Hematology and Oncology

## 2017-11-26 DIAGNOSIS — I351 Nonrheumatic aortic (valve) insufficiency: Secondary | ICD-10-CM | POA: Insufficient documentation

## 2017-11-26 DIAGNOSIS — C50411 Malignant neoplasm of upper-outer quadrant of right female breast: Secondary | ICD-10-CM | POA: Diagnosis not present

## 2017-11-26 DIAGNOSIS — Z170 Estrogen receptor positive status [ER+]: Secondary | ICD-10-CM | POA: Diagnosis not present

## 2017-11-26 NOTE — Progress Notes (Signed)
  Echocardiogram 2D Echocardiogram has been performed.  Madelaine Etienne 11/26/2017, 11:11 AM

## 2017-11-26 NOTE — H&P (Signed)
History of Present Illness The patient is a 67 year old female who presents with breast cancer. She is a post menopausal female referred by Dr. Ammie Ferrier for evaluation of recently diagnosed carcinoma of the right breast. She recently presented for a screening mamogram revealing abnormal calcifications and masses in the right breast. Subsequent imaging included diagnostic mamogram showing several findings including an irregular mass with indistinct margins containing microcalcifications in the upper outer right breast anteriorly, and indistinct oval mass density in the more posterior upper outer right breast 9 o'clock position containing calcifications and a group of pleomorphic calcifications in the posterior aspect upper outer right breast spanning 2 cm. Also noted were loosely grouped and scattered calcifications encompassing all of these areas and together spanning over 10 cm and ultrasound showing a 1.4 cm irregular mass 10 o'clock position right breast, 6 mm more laterally and superiorly a 5 mm indistinct hypoechoic area and no ultrasound correlate for the more posterior masslike density. Axillary ultrasound showed a single node with cortical thickening. 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 Biopsy was performed of the abnormal appearing lymph node which was benign and concordant. The wider more loosely grouped calcifications in the posterior indistinct mass were not biopsied. She is seen now in the office for initial treatment planning. She has experienced no breast symptoms, specifically lump or pain, skin changes or nipple discharge. She does not have a personal history of any previous breast problems.  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 Xanax (0.25MG Tablet, Oral) Active. AmLODIPine Besylate (5MG Tablet, Oral) Active. Aspirin (81MG Tablet DR, Oral) Active. Lipitor (20MG Tablet, Oral) Active. Symbicort (160-4.5MCG/ACT Aerosol, Inhalation) Active. Flexeril (10MG Tablet, Oral) Active. Lantus SoloStar (100UNIT/ML Soln Pen-inj, Subcutaneous) Active. MetFORMIN HCl ER (MOD) (1000MG Tablet ER 24HR, Oral) Active. Nitrostat (0.4MG Tab Sublingual, Sublingual) Active. Protonix (40MG Tablet DR, Oral) Active. Metoprolol Succinate ER (50MG Tablet ER 24HR, Oral) Active. Medications Reconciled  Social History  Alcohol use  Remotely quit alcohol use. Caffeine use  Coffee, Tea. No drug use  Tobacco use  Former smoker.  Pregnancy / Birth History Age at menarche  77 years. Age of menopause  21-55 Gravida  4 Irregular periods  Length (months) of breastfeeding  7-12 Maternal age  39-20 Para  1  Other Problems Alcohol Abuse  Anxiety Disorder  Arthritis  Back Pain  Breast Cancer  Chest pain  Chronic Obstructive Lung Disease  Congestive Heart Failure  Depression  Diabetes Mellitus  Gastric Ulcer  Gastroesophageal Reflux Disease  High blood pressure  Hypercholesterolemia  Lump In Breast  Myocardial infarction     Review of Systems  General Not Present- Appetite Loss, Chills, Fatigue, Fever, Night Sweats, Weight Gain and Weight Loss. Skin Not Present- Change in Wart/Mole,  Dryness, Hives, Jaundice, New Lesions,  Non-Healing Wounds, Rash and Ulcer. HEENT Present- Hearing Loss, Ringing in the Ears and Wears glasses/contact lenses. Not Present- Earache, Hoarseness, Nose Bleed, Oral Ulcers, Seasonal Allergies, Sinus Pain, Sore Throat, Visual Disturbances and Yellow Eyes. Respiratory Present- Chronic Cough, Difficulty Breathing, Snoring and Wheezing. Not Present- Bloody sputum. Cardiovascular Present- Difficulty Breathing Lying Down, Leg Cramps, Shortness of Breath and Swelling of Extremities. Not Present- Chest Pain, Palpitations and Rapid Heart Rate. Gastrointestinal Present- Change in Bowel Habits and Gets full quickly at meals. Not Present- Abdominal Pain, Bloating, Bloody Stool, Chronic diarrhea, Constipation, Difficulty Swallowing, Excessive gas, Hemorrhoids, Indigestion, Nausea, Rectal Pain and Vomiting. Musculoskeletal Present- Back Pain, Joint Pain, Joint Stiffness, Muscle Pain, Muscle Weakness and Swelling of Extremities. Neurological Present- Decreased Memory, Headaches, Numbness, Tingling, Tremor, Trouble walking and Weakness. Not Present- Fainting and Seizures. Psychiatric Present- Anxiety, Depression, Fearful and Frequent crying. Not Present- Bipolar and Change in Sleep Pattern. Endocrine Present- Cold Intolerance, Heat Intolerance and Hot flashes. Not Present- Excessive Hunger, Hair Changes and New Diabetes. Hematology Present- Easy Bruising. Not Present- Blood Thinners, Excessive bleeding, Gland problems, HIV and Persistent Infections.  Vitals   Weight: 128.13 lb Height: 59in Body Surface Area: 1.53 m Body Mass Index: 25.88 kg/m  Temp.: 46F(Oral)  Pulse: 83 (Regular)  BP: 132/84 (Sitting, Left Arm, Standard)       Physical Exam  The physical exam findings are as follows: Note:General: Alert, elderly African American female, somewhat flat affect Skin: Warm and dry without rash or infection. HEENT: No palpable masses or thyromegaly.  Sclera nonicteric. Lymph nodes: No cervical, supraclavicular, or inguinal nodes palpable. Breasts: Ptotic breasts bilaterally. No palpable masses in either breast except for some thickening of the core biopsy site on the right. No palpable axillary adenopathy. Lungs: Breath sounds are distant. No wheezing or increased work of breathing. Cardiovascular: Regular rate and rhythm without murmer. No JVD or edema. Feet are warm but without palpable pulses Abdomen: Nondistended. Soft and nontender. Well-healed incision. No masses palpable. No organomegaly. No palpable hernias. Extremities: No edema or joint swelling or deformity. No chronic venous stasis changes. Neurologic: Alert but flat affect. Gait normal. No focal weakness. Psychiatric: Poor historian and somewhat limited insight to current and past problems.    Assessment & Plan  MALIGNANT NEOPLASM OF BREAST, STAGE 1, ESTROGEN RECEPTOR POSITIVE, UNSPECIFIED LATERALITY (C50.919) Impression: 67 year old female with multiple significant medical problems, with a new diagnosis of cancer of the right breast, multicentric. Clinical stage Ia, ER/PR positive, HER-2 positive in the invasive cancer. 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 In addition she has another posterior suspicious indistinct mass and widely scattered pleomorphic calcifications of concern for DCIS spanning over 10 cm that were not biopsied. I discussed with the patient and her niece  treatment options. Lumpectomy would be problematic but potentially doable with the 2 known areas of cancer but she has other suspicious areas that would require biopsy if we were to pursue this course. In addition I think she would have great difficulty lying flat for radiation. I think all in all she would be best served with total mastectomy and after discussion she and her niece are in agreement. She has multiple significant medical problems including  coronary artery disease and COPD.  At the time of surgery she has now had cardiac and pulmonary clearance.  She has seen medical oncology and due to multiple comorbidities Herceptin only every 3 weeks for 1 year has been recommended postoperatively  and she will need a Port-A-Cath.  The procedure was discussed with the patient and the niece including its indications and expected recovery, risks of cardiopulmonary complications, bleeding, infection and wound healing problems. . Current Plans   Right total mastectomy with axillary sentinel lymph node biopsy,  Port-A-Cath placement.

## 2017-11-27 ENCOUNTER — Encounter (HOSPITAL_COMMUNITY): Payer: Self-pay

## 2017-11-27 ENCOUNTER — Ambulatory Visit (HOSPITAL_COMMUNITY): Payer: Medicare Other

## 2017-11-27 ENCOUNTER — Ambulatory Visit (HOSPITAL_COMMUNITY): Payer: Medicare Other | Admitting: Anesthesiology

## 2017-11-27 ENCOUNTER — Observation Stay (HOSPITAL_COMMUNITY)
Admission: RE | Admit: 2017-11-27 | Discharge: 2017-11-28 | Disposition: A | Discharge status: Home / Self Care | Payer: Medicare Other | Source: Ambulatory Visit | Attending: General Surgery | Admitting: General Surgery

## 2017-11-27 ENCOUNTER — Encounter (HOSPITAL_COMMUNITY)
Admission: RE | Disposition: A | Discharge status: Home / Self Care | Payer: Self-pay | Source: Ambulatory Visit | Attending: General Surgery

## 2017-11-27 ENCOUNTER — Ambulatory Visit (HOSPITAL_COMMUNITY)
Admission: RE | Admit: 2017-11-27 | Discharge: 2017-11-27 | Disposition: A | Discharge status: Home / Self Care | Payer: Medicare Other | Source: Ambulatory Visit | Attending: General Surgery | Admitting: General Surgery

## 2017-11-27 ENCOUNTER — Ambulatory Visit (HOSPITAL_COMMUNITY): Payer: Medicare Other | Admitting: Physician Assistant

## 2017-11-27 DIAGNOSIS — I2510 Atherosclerotic heart disease of native coronary artery without angina pectoris: Secondary | ICD-10-CM | POA: Diagnosis not present

## 2017-11-27 DIAGNOSIS — Z7984 Long term (current) use of oral hypoglycemic drugs: Secondary | ICD-10-CM | POA: Insufficient documentation

## 2017-11-27 DIAGNOSIS — Z7982 Long term (current) use of aspirin: Secondary | ICD-10-CM | POA: Insufficient documentation

## 2017-11-27 DIAGNOSIS — F329 Major depressive disorder, single episode, unspecified: Secondary | ICD-10-CM | POA: Diagnosis not present

## 2017-11-27 DIAGNOSIS — F419 Anxiety disorder, unspecified: Secondary | ICD-10-CM | POA: Diagnosis not present

## 2017-11-27 DIAGNOSIS — E7800 Pure hypercholesterolemia, unspecified: Secondary | ICD-10-CM | POA: Diagnosis not present

## 2017-11-27 DIAGNOSIS — I110 Hypertensive heart disease with heart failure: Secondary | ICD-10-CM | POA: Diagnosis not present

## 2017-11-27 DIAGNOSIS — M1990 Unspecified osteoarthritis, unspecified site: Secondary | ICD-10-CM | POA: Insufficient documentation

## 2017-11-27 DIAGNOSIS — C50811 Malignant neoplasm of overlapping sites of right female breast: Secondary | ICD-10-CM

## 2017-11-27 DIAGNOSIS — C50411 Malignant neoplasm of upper-outer quadrant of right female breast: Principal | ICD-10-CM | POA: Insufficient documentation

## 2017-11-27 DIAGNOSIS — Z794 Long term (current) use of insulin: Secondary | ICD-10-CM | POA: Insufficient documentation

## 2017-11-27 DIAGNOSIS — E119 Type 2 diabetes mellitus without complications: Secondary | ICD-10-CM | POA: Insufficient documentation

## 2017-11-27 DIAGNOSIS — C773 Secondary and unspecified malignant neoplasm of axilla and upper limb lymph nodes: Secondary | ICD-10-CM | POA: Diagnosis not present

## 2017-11-27 DIAGNOSIS — I509 Heart failure, unspecified: Secondary | ICD-10-CM | POA: Insufficient documentation

## 2017-11-27 DIAGNOSIS — Z87891 Personal history of nicotine dependence: Secondary | ICD-10-CM | POA: Insufficient documentation

## 2017-11-27 DIAGNOSIS — K219 Gastro-esophageal reflux disease without esophagitis: Secondary | ICD-10-CM | POA: Insufficient documentation

## 2017-11-27 DIAGNOSIS — J449 Chronic obstructive pulmonary disease, unspecified: Secondary | ICD-10-CM | POA: Insufficient documentation

## 2017-11-27 DIAGNOSIS — Z95828 Presence of other vascular implants and grafts: Secondary | ICD-10-CM

## 2017-11-27 DIAGNOSIS — R079 Chest pain, unspecified: Secondary | ICD-10-CM | POA: Diagnosis not present

## 2017-11-27 DIAGNOSIS — Z170 Estrogen receptor positive status [ER+]: Secondary | ICD-10-CM | POA: Insufficient documentation

## 2017-11-27 DIAGNOSIS — J984 Other disorders of lung: Secondary | ICD-10-CM | POA: Diagnosis not present

## 2017-11-27 DIAGNOSIS — I252 Old myocardial infarction: Secondary | ICD-10-CM | POA: Insufficient documentation

## 2017-11-27 DIAGNOSIS — G8918 Other acute postprocedural pain: Secondary | ICD-10-CM | POA: Diagnosis not present

## 2017-11-27 DIAGNOSIS — I10 Essential (primary) hypertension: Secondary | ICD-10-CM | POA: Diagnosis not present

## 2017-11-27 DIAGNOSIS — C50911 Malignant neoplasm of unspecified site of right female breast: Secondary | ICD-10-CM | POA: Diagnosis not present

## 2017-11-27 HISTORY — PX: PORTACATH PLACEMENT: SHX2246

## 2017-11-27 HISTORY — PX: MASTECTOMY W/ SENTINEL NODE BIOPSY: SHX2001

## 2017-11-27 HISTORY — DX: Malignant neoplasm of overlapping sites of right female breast: C50.811

## 2017-11-27 LAB — GLUCOSE, CAPILLARY
GLUCOSE-CAPILLARY: 115 mg/dL — AB (ref 70–99)
GLUCOSE-CAPILLARY: 123 mg/dL — AB (ref 70–99)
GLUCOSE-CAPILLARY: 289 mg/dL — AB (ref 70–99)
Glucose-Capillary: 104 mg/dL — ABNORMAL HIGH (ref 70–99)

## 2017-11-27 SURGERY — MASTECTOMY WITH SENTINEL LYMPH NODE BIOPSY
Anesthesia: General | Site: Chest | Laterality: Right

## 2017-11-27 MED ORDER — IOPAMIDOL (ISOVUE-300) INJECTION 61%
INTRAVENOUS | Status: AC
  Filled 2017-11-27: qty 50

## 2017-11-27 MED ORDER — ENOXAPARIN SODIUM 40 MG/0.4ML ~~LOC~~ SOLN
40.0000 mg | SUBCUTANEOUS | Status: DC
  Administered 2017-11-28: 40 mg via SUBCUTANEOUS
  Filled 2017-11-27: qty 0.4

## 2017-11-27 MED ORDER — SUGAMMADEX SODIUM 200 MG/2ML IV SOLN
INTRAVENOUS | Status: DC | PRN
  Administered 2017-11-27: 200 mg via INTRAVENOUS

## 2017-11-27 MED ORDER — SODIUM CHLORIDE 0.9 % IV SOLN
INTRAVENOUS | Status: DC | PRN
  Administered 2017-11-27: 25 ug/min via INTRAVENOUS

## 2017-11-27 MED ORDER — HEPARIN SOD (PORK) LOCK FLUSH 100 UNIT/ML IV SOLN
INTRAVENOUS | Status: AC
  Filled 2017-11-27: qty 5

## 2017-11-27 MED ORDER — MIDAZOLAM HCL 2 MG/2ML IJ SOLN
INTRAMUSCULAR | Status: AC
  Filled 2017-11-27: qty 2

## 2017-11-27 MED ORDER — SODIUM CHLORIDE FLUSH 0.9 % IV SOLN
INTRAVENOUS | Status: AC
  Filled 2017-11-27: qty 10

## 2017-11-27 MED ORDER — FENTANYL CITRATE (PF) 100 MCG/2ML IJ SOLN
25.0000 ug | INTRAMUSCULAR | Status: DC | PRN
  Administered 2017-11-27: 25 ug via INTRAVENOUS

## 2017-11-27 MED ORDER — MORPHINE SULFATE (PF) 2 MG/ML IV SOLN: 2.0000 mg | INTRAVENOUS | Status: DC | PRN

## 2017-11-27 MED ORDER — CELECOXIB 200 MG PO CAPS
200.0000 mg | ORAL_CAPSULE | ORAL | Status: AC
  Administered 2017-11-27: 200 mg via ORAL
  Filled 2017-11-27: qty 1

## 2017-11-27 MED ORDER — DEXAMETHASONE SODIUM PHOSPHATE 10 MG/ML IJ SOLN
INTRAMUSCULAR | Status: DC | PRN
  Administered 2017-11-27: 10 mg via INTRAVENOUS

## 2017-11-27 MED ORDER — HEPARIN SOD (PORK) LOCK FLUSH 100 UNIT/ML IV SOLN
INTRAVENOUS | Status: DC | PRN
  Administered 2017-11-27: 500 [IU]

## 2017-11-27 MED ORDER — SODIUM CHLORIDE 0.9 % IV SOLN
INTRAVENOUS | Status: AC
  Filled 2017-11-27: qty 1.2

## 2017-11-27 MED ORDER — ALBUTEROL SULFATE (2.5 MG/3ML) 0.083% IN NEBU: 2.5000 mg | INHALATION_SOLUTION | Freq: Four times a day (QID) | RESPIRATORY_TRACT | Status: DC | PRN

## 2017-11-27 MED ORDER — METOPROLOL SUCCINATE ER 50 MG PO TB24
50.0000 mg | ORAL_TABLET | Freq: Every day | ORAL | Status: DC
  Filled 2017-11-27: qty 1

## 2017-11-27 MED ORDER — FENTANYL CITRATE (PF) 100 MCG/2ML IJ SOLN
50.0000 ug | Freq: Once | INTRAMUSCULAR | Status: AC
  Administered 2017-11-27: 50 ug via INTRAVENOUS

## 2017-11-27 MED ORDER — AMLODIPINE BESYLATE 5 MG PO TABS
5.0000 mg | ORAL_TABLET | Freq: Every day | ORAL | Status: DC
  Administered 2017-11-28: 5 mg via ORAL
  Filled 2017-11-27: qty 1

## 2017-11-27 MED ORDER — ACETAMINOPHEN 500 MG PO TABS
1000.0000 mg | ORAL_TABLET | Freq: Four times a day (QID) | ORAL | Status: DC
  Administered 2017-11-27 – 2017-11-28 (×4): 1000 mg via ORAL
  Filled 2017-11-27 (×4): qty 2

## 2017-11-27 MED ORDER — FENTANYL CITRATE (PF) 100 MCG/2ML IJ SOLN
INTRAMUSCULAR | Status: AC
  Filled 2017-11-27: qty 2

## 2017-11-27 MED ORDER — CEFAZOLIN SODIUM 1 G IJ SOLR
INTRAMUSCULAR | Status: AC
  Filled 2017-11-27: qty 10

## 2017-11-27 MED ORDER — ACETAMINOPHEN 160 MG/5ML PO SOLN: 325.0000 mg | ORAL | Status: DC | PRN

## 2017-11-27 MED ORDER — SODIUM CHLORIDE 0.9 % IV SOLN
INTRAVENOUS | Status: DC | PRN
  Administered 2017-11-27: 600 mL

## 2017-11-27 MED ORDER — OXYCODONE HCL 5 MG PO TABS: 5.0000 mg | ORAL_TABLET | Freq: Once | ORAL | Status: DC | PRN

## 2017-11-27 MED ORDER — OXYCODONE HCL 5 MG PO TABS
5.0000 mg | ORAL_TABLET | ORAL | Status: DC | PRN
  Administered 2017-11-27: 5 mg via ORAL
  Administered 2017-11-28 (×2): 10 mg via ORAL
  Administered 2017-11-28: 5 mg via ORAL
  Filled 2017-11-27: qty 2
  Filled 2017-11-27 (×2): qty 1
  Filled 2017-11-27: qty 2

## 2017-11-27 MED ORDER — ROCURONIUM BROMIDE 50 MG/5ML IV SOSY
PREFILLED_SYRINGE | INTRAVENOUS | Status: DC | PRN
  Administered 2017-11-27: 10 mg via INTRAVENOUS
  Administered 2017-11-27: 40 mg via INTRAVENOUS

## 2017-11-27 MED ORDER — CEFAZOLIN SODIUM-DEXTROSE 2-4 GM/100ML-% IV SOLN
2.0000 g | INTRAVENOUS | Status: AC
  Administered 2017-11-27: 2 g via INTRAVENOUS
  Filled 2017-11-27: qty 100

## 2017-11-27 MED ORDER — NITROGLYCERIN 0.4 MG SL SUBL: 0.4000 mg | SUBLINGUAL_TABLET | SUBLINGUAL | Status: DC | PRN

## 2017-11-27 MED ORDER — FENTANYL CITRATE (PF) 250 MCG/5ML IJ SOLN
INTRAMUSCULAR | Status: AC
  Filled 2017-11-27: qty 5

## 2017-11-27 MED ORDER — BUPIVACAINE-EPINEPHRINE (PF) 0.25% -1:200000 IJ SOLN
INTRAMUSCULAR | Status: AC
  Filled 2017-11-27: qty 30

## 2017-11-27 MED ORDER — LIDOCAINE HCL (PF) 1 % IJ SOLN
INTRAMUSCULAR | Status: AC
  Filled 2017-11-27: qty 30

## 2017-11-27 MED ORDER — CHLORHEXIDINE GLUCONATE CLOTH 2 % EX PADS: 6.0000 | MEDICATED_PAD | Freq: Once | CUTANEOUS | Status: DC

## 2017-11-27 MED ORDER — GLYCOPYRROLATE PF 0.2 MG/ML IJ SOSY
PREFILLED_SYRINGE | INTRAMUSCULAR | Status: DC | PRN
  Administered 2017-11-27: .2 mg via INTRAVENOUS

## 2017-11-27 MED ORDER — LIDOCAINE 2% (20 MG/ML) 5 ML SYRINGE
INTRAMUSCULAR | Status: DC | PRN
  Administered 2017-11-27: 80 mg via INTRAVENOUS

## 2017-11-27 MED ORDER — LACTATED RINGERS IV SOLN: INTRAVENOUS | Status: DC

## 2017-11-27 MED ORDER — GABAPENTIN 300 MG PO CAPS
300.0000 mg | ORAL_CAPSULE | Freq: Two times a day (BID) | ORAL | Status: DC
  Administered 2017-11-27 – 2017-11-28 (×2): 300 mg via ORAL
  Filled 2017-11-27 (×2): qty 1

## 2017-11-27 MED ORDER — DEXAMETHASONE SODIUM PHOSPHATE 10 MG/ML IJ SOLN
INTRAMUSCULAR | Status: AC
  Filled 2017-11-27: qty 2

## 2017-11-27 MED ORDER — MOMETASONE FURO-FORMOTEROL FUM 200-5 MCG/ACT IN AERO
2.0000 | INHALATION_SPRAY | Freq: Two times a day (BID) | RESPIRATORY_TRACT | Status: DC
  Administered 2017-11-28: 2 via RESPIRATORY_TRACT
  Filled 2017-11-27: qty 8.8

## 2017-11-27 MED ORDER — ONDANSETRON HCL 4 MG/2ML IJ SOLN: 4.0000 mg | Freq: Once | INTRAMUSCULAR | Status: DC | PRN

## 2017-11-27 MED ORDER — PANTOPRAZOLE SODIUM 40 MG PO TBEC
40.0000 mg | DELAYED_RELEASE_TABLET | Freq: Every day | ORAL | Status: DC
  Administered 2017-11-28: 40 mg via ORAL
  Filled 2017-11-27: qty 1

## 2017-11-27 MED ORDER — OXYCODONE HCL 5 MG/5ML PO SOLN: 5.0000 mg | Freq: Once | ORAL | Status: DC | PRN

## 2017-11-27 MED ORDER — ACETAMINOPHEN 500 MG PO TABS
1000.0000 mg | ORAL_TABLET | ORAL | Status: AC
  Administered 2017-11-27: 1000 mg via ORAL
  Filled 2017-11-27: qty 2

## 2017-11-27 MED ORDER — HYDRALAZINE HCL 20 MG/ML IJ SOLN: 10.0000 mg | INTRAMUSCULAR | Status: DC | PRN

## 2017-11-27 MED ORDER — INSULIN ASPART 100 UNIT/ML ~~LOC~~ SOLN
0.0000 [IU] | Freq: Three times a day (TID) | SUBCUTANEOUS | Status: DC
  Administered 2017-11-27: 2 [IU] via SUBCUTANEOUS
  Administered 2017-11-28: 5 [IU] via SUBCUTANEOUS
  Administered 2017-11-28: 2 [IU] via SUBCUTANEOUS

## 2017-11-27 MED ORDER — PHENYLEPHRINE 40 MCG/ML (10ML) SYRINGE FOR IV PUSH (FOR BLOOD PRESSURE SUPPORT)
PREFILLED_SYRINGE | INTRAVENOUS | Status: AC
  Filled 2017-11-27: qty 20

## 2017-11-27 MED ORDER — 0.9 % SODIUM CHLORIDE (POUR BTL) OPTIME
TOPICAL | Status: DC | PRN
  Administered 2017-11-27: 1000 mL

## 2017-11-27 MED ORDER — MEPERIDINE HCL 50 MG/ML IJ SOLN: 6.2500 mg | INTRAMUSCULAR | Status: DC | PRN

## 2017-11-27 MED ORDER — ROPIVACAINE HCL 7.5 MG/ML IJ SOLN
INTRAMUSCULAR | Status: DC | PRN
  Administered 2017-11-27: 25 mL via PERINEURAL

## 2017-11-27 MED ORDER — SODIUM BICARBONATE 4 % IV SOLN
INTRAVENOUS | Status: AC
  Filled 2017-11-27: qty 5

## 2017-11-27 MED ORDER — GLYCOPYRROLATE PF 0.2 MG/ML IJ SOSY
PREFILLED_SYRINGE | INTRAMUSCULAR | Status: AC
  Filled 2017-11-27: qty 1

## 2017-11-27 MED ORDER — LACTATED RINGERS IV SOLN
INTRAVENOUS | Status: DC | PRN
  Administered 2017-11-27 (×2): via INTRAVENOUS

## 2017-11-27 MED ORDER — FENTANYL CITRATE (PF) 100 MCG/2ML IJ SOLN
INTRAMUSCULAR | Status: AC
  Administered 2017-11-27: 50 ug via INTRAVENOUS
  Filled 2017-11-27: qty 2

## 2017-11-27 MED ORDER — PROPOFOL 10 MG/ML IV BOLUS
INTRAVENOUS | Status: DC | PRN
  Administered 2017-11-27: 150 mg via INTRAVENOUS

## 2017-11-27 MED ORDER — ACETAMINOPHEN 500 MG PO TABS: 1000.0000 mg | ORAL_TABLET | Freq: Four times a day (QID) | ORAL | Status: DC

## 2017-11-27 MED ORDER — PHENYLEPHRINE HCL 10 MG/ML IJ SOLN
INTRAMUSCULAR | Status: DC | PRN
  Administered 2017-11-27 (×2): 80 ug via INTRAVENOUS
  Administered 2017-11-27: 40 ug via INTRAVENOUS
  Administered 2017-11-27: 80 ug via INTRAVENOUS
  Administered 2017-11-27: 40 ug via INTRAVENOUS

## 2017-11-27 MED ORDER — LIDOCAINE 2% (20 MG/ML) 5 ML SYRINGE
INTRAMUSCULAR | Status: AC
  Filled 2017-11-27: qty 15

## 2017-11-27 MED ORDER — ATORVASTATIN CALCIUM 20 MG PO TABS
20.0000 mg | ORAL_TABLET | Freq: Every day | ORAL | Status: DC
  Administered 2017-11-28: 20 mg via ORAL
  Filled 2017-11-27: qty 1

## 2017-11-27 MED ORDER — SODIUM CHLORIDE 0.9 % IJ SOLN
INTRAMUSCULAR | Status: AC
  Filled 2017-11-27: qty 10

## 2017-11-27 MED ORDER — TECHNETIUM TC 99M SULFUR COLLOID FILTERED
1.0000 | Freq: Once | INTRAVENOUS | Status: AC | PRN
  Administered 2017-11-27: 1 via INTRADERMAL

## 2017-11-27 MED ORDER — METHYLENE BLUE 0.5 % INJ SOLN
INTRAVENOUS | Status: AC
  Filled 2017-11-27: qty 10

## 2017-11-27 MED ORDER — ONDANSETRON HCL 4 MG/2ML IJ SOLN
INTRAMUSCULAR | Status: AC
  Filled 2017-11-27: qty 6

## 2017-11-27 MED ORDER — ALPRAZOLAM 0.25 MG PO TABS
0.2500 mg | ORAL_TABLET | Freq: Two times a day (BID) | ORAL | Status: DC
  Administered 2017-11-27 – 2017-11-28 (×2): 0.25 mg via ORAL
  Filled 2017-11-27 (×2): qty 1

## 2017-11-27 MED ORDER — GABAPENTIN 300 MG PO CAPS
300.0000 mg | ORAL_CAPSULE | ORAL | Status: AC
  Administered 2017-11-27: 300 mg via ORAL
  Filled 2017-11-27: qty 1

## 2017-11-27 MED ORDER — INSULIN GLARGINE 100 UNIT/ML ~~LOC~~ SOLN
15.0000 [IU] | Freq: Every day | SUBCUTANEOUS | Status: DC
  Administered 2017-11-27: 15 [IU] via SUBCUTANEOUS
  Filled 2017-11-27 (×3): qty 0.15

## 2017-11-27 MED ORDER — LACTATED RINGERS IV SOLN
INTRAVENOUS | Status: DC
  Administered 2017-11-27: 11:00:00 via INTRAVENOUS

## 2017-11-27 MED ORDER — ACETAMINOPHEN 325 MG PO TABS: 325.0000 mg | ORAL_TABLET | ORAL | Status: DC | PRN

## 2017-11-27 MED ORDER — BUPIVACAINE-EPINEPHRINE 0.25% -1:200000 IJ SOLN
INTRAMUSCULAR | Status: DC | PRN
  Administered 2017-11-27: 10 mL

## 2017-11-27 MED ORDER — CELECOXIB 200 MG PO CAPS
200.0000 mg | ORAL_CAPSULE | Freq: Two times a day (BID) | ORAL | Status: DC
  Administered 2017-11-27 – 2017-11-28 (×2): 200 mg via ORAL
  Filled 2017-11-27 (×2): qty 1

## 2017-11-27 MED ORDER — FENTANYL CITRATE (PF) 100 MCG/2ML IJ SOLN
INTRAMUSCULAR | Status: DC | PRN
  Administered 2017-11-27: 100 ug via INTRAVENOUS
  Administered 2017-11-27 (×2): 25 ug via INTRAVENOUS

## 2017-11-27 SURGICAL SUPPLY — 83 items
ADH SKN CLS APL DERMABOND .7 (GAUZE/BANDAGES/DRESSINGS) ×4
BAG DECANTER FOR FLEXI CONT (MISCELLANEOUS) ×4 IMPLANT
BINDER BREAST LRG (GAUZE/BANDAGES/DRESSINGS) IMPLANT
BINDER BREAST XLRG (GAUZE/BANDAGES/DRESSINGS) ×2 IMPLANT
BIOPATCH RED 1 DISK 7.0 (GAUZE/BANDAGES/DRESSINGS) ×3 IMPLANT
BIOPATCH RED 1IN DISK 7.0MM (GAUZE/BANDAGES/DRESSINGS) ×1
CANISTER SUCT 3000ML PPV (MISCELLANEOUS) ×8 IMPLANT
CHLORAPREP W/TINT 10.5 ML (MISCELLANEOUS) ×4 IMPLANT
CHLORAPREP W/TINT 26ML (MISCELLANEOUS) ×4 IMPLANT
CLIP VESOCCLUDE MED 6/CT (CLIP) ×4 IMPLANT
CONT SPEC 4OZ CLIKSEAL STRL BL (MISCELLANEOUS) ×4 IMPLANT
COVER PROBE W GEL 5X96 (DRAPES) ×4 IMPLANT
COVER SURGICAL LIGHT HANDLE (MISCELLANEOUS) ×4 IMPLANT
COVER TRANSDUCER ULTRASND GEL (DRAPE) IMPLANT
COVER WAND RF STERILE (DRAPES) ×2 IMPLANT
CRADLE DONUT ADULT HEAD (MISCELLANEOUS) ×4 IMPLANT
DECANTER SPIKE VIAL GLASS SM (MISCELLANEOUS) IMPLANT
DERMABOND ADVANCED (GAUZE/BANDAGES/DRESSINGS) ×4
DERMABOND ADVANCED .7 DNX12 (GAUZE/BANDAGES/DRESSINGS) ×2 IMPLANT
DEVICE DISSECT PLASMABLAD 3.0S (MISCELLANEOUS) ×2 IMPLANT
DRAIN CHANNEL 19F RND (DRAIN) ×4 IMPLANT
DRAPE C-ARM 42X72 X-RAY (DRAPES) ×4 IMPLANT
DRAPE CHEST BREAST 15X10 FENES (DRAPES) ×4 IMPLANT
DRAPE SURG 17X23 STRL (DRAPES) ×16 IMPLANT
DRSG PAD ABDOMINAL 8X10 ST (GAUZE/BANDAGES/DRESSINGS) ×2 IMPLANT
DRSG TEGADERM 4X4.75 (GAUZE/BANDAGES/DRESSINGS) ×4 IMPLANT
ELECT CAUTERY BLADE 6.4 (BLADE) ×4 IMPLANT
ELECT REM PT RETURN 9FT ADLT (ELECTROSURGICAL) ×8
ELECTRODE REM PT RTRN 9FT ADLT (ELECTROSURGICAL) ×4 IMPLANT
EVACUATOR SILICONE 100CC (DRAIN) ×4 IMPLANT
GAUZE 4X4 16PLY RFD (DISPOSABLE) ×2 IMPLANT
GAUZE SPONGE 4X4 12PLY STRL LF (GAUZE/BANDAGES/DRESSINGS) ×4 IMPLANT
GEL ULTRASOUND 20GR AQUASONIC (MISCELLANEOUS) IMPLANT
GLOVE BIOGEL PI IND STRL 8 (GLOVE) ×2 IMPLANT
GLOVE BIOGEL PI INDICATOR 8 (GLOVE) ×2
GLOVE ECLIPSE 7.5 STRL STRAW (GLOVE) ×6 IMPLANT
GOWN STRL REUS W/ TWL LRG LVL3 (GOWN DISPOSABLE) ×2 IMPLANT
GOWN STRL REUS W/ TWL XL LVL3 (GOWN DISPOSABLE) ×2 IMPLANT
GOWN STRL REUS W/TWL LRG LVL3 (GOWN DISPOSABLE) ×4
GOWN STRL REUS W/TWL XL LVL3 (GOWN DISPOSABLE) ×4
INTRODUCER COOK 11FR (CATHETERS) IMPLANT
IV CATH 14GX2 1/4 (CATHETERS) IMPLANT
KIT BASIN OR (CUSTOM PROCEDURE TRAY) ×4 IMPLANT
KIT PORT POWER 8FR ISP CVUE (Port) ×2 IMPLANT
KIT TURNOVER KIT B (KITS) ×4 IMPLANT
NDL 18GX1X1/2 (RX/OR ONLY) (NEEDLE) ×2 IMPLANT
NDL BLUNT 16X1.5 OR ONLY (NEEDLE) IMPLANT
NDL HYPO 25GX1X1/2 BEV (NEEDLE) ×2 IMPLANT
NEEDLE 18GX1X1/2 (RX/OR ONLY) (NEEDLE) ×4 IMPLANT
NEEDLE 22X1 1/2 (OR ONLY) (NEEDLE) ×4 IMPLANT
NEEDLE BLUNT 16X1.5 OR ONLY (NEEDLE) IMPLANT
NEEDLE HYPO 25GX1X1/2 BEV (NEEDLE) ×4 IMPLANT
NS IRRIG 1000ML POUR BTL (IV SOLUTION) ×4 IMPLANT
PACK GENERAL/GYN (CUSTOM PROCEDURE TRAY) ×4 IMPLANT
PAD ABD 8X10 STRL (GAUZE/BANDAGES/DRESSINGS) ×8 IMPLANT
PAD ARMBOARD 7.5X6 YLW CONV (MISCELLANEOUS) ×4 IMPLANT
PENCIL BUTTON HOLSTER BLD 10FT (ELECTRODE) ×4 IMPLANT
PLASMABLADE 3.0S (MISCELLANEOUS) ×4
RUBBERBAND STERILE (MISCELLANEOUS) ×2 IMPLANT
SET INTRODUCER 12FR PACEMAKER (INTRODUCER) IMPLANT
SET SHEATH INTRODUCER 10FR (MISCELLANEOUS) IMPLANT
SHEATH COOK PEEL AWAY SET 9F (SHEATH) IMPLANT
SPECIMEN JAR X LARGE (MISCELLANEOUS) ×4 IMPLANT
STAPLER VISISTAT 35W (STAPLE) ×2 IMPLANT
SUT ETHILON 2 0 FS 18 (SUTURE) ×4 IMPLANT
SUT MNCRL AB 4-0 PS2 18 (SUTURE) ×4 IMPLANT
SUT MON AB 5-0 PS2 18 (SUTURE) ×4 IMPLANT
SUT PROLENE 2 0 SH DA (SUTURE) ×4 IMPLANT
SUT SILK 2 0 (SUTURE)
SUT SILK 2-0 18XBRD TIE 12 (SUTURE) IMPLANT
SUT VIC AB 3-0 54X BRD REEL (SUTURE) ×2 IMPLANT
SUT VIC AB 3-0 BRD 54 (SUTURE) ×4
SUT VIC AB 3-0 SH 18 (SUTURE) ×8 IMPLANT
SYR 20CC LL (SYRINGE) ×4 IMPLANT
SYR 5ML LUER SLIP (SYRINGE) ×4 IMPLANT
SYR BULB 3OZ (MISCELLANEOUS) ×4 IMPLANT
SYR CONTROL 10ML LL (SYRINGE) ×4 IMPLANT
TOWEL OR 17X24 6PK STRL BLUE (TOWEL DISPOSABLE) ×4 IMPLANT
TOWEL OR 17X26 10 PK STRL BLUE (TOWEL DISPOSABLE) ×4 IMPLANT
TRAY LAPAROSCOPIC MC (CUSTOM PROCEDURE TRAY) ×4 IMPLANT
TUBE CONNECTING 12'X1/4 (SUCTIONS) ×1
TUBE CONNECTING 12X1/4 (SUCTIONS) ×3 IMPLANT
YANKAUER SUCT BULB TIP NO VENT (SUCTIONS) ×2 IMPLANT

## 2017-11-27 NOTE — Interval H&P Note (Signed)
History and Physical Interval Note:  11/27/2017 11:42 AM  [name]  has presented today for surgery, with the diagnosis of RIGHT BREAST CANCER  The various methods of treatment have been discussed with the patient and family. After consideration of risks, benefits and other options for treatment, the patient has consented to  Procedure(s): RIGHT TOTAL MASTECTOMY WITH SENTINEL LYMPH NODE BIOPSY (Right) INSERTION PORT-A-CATH (N/A) as a surgical intervention .  The patient's history has been reviewed, patient examined, no change in status, stable for surgery.  I have reviewed the patient's chart and labs.  Questions were answered to the patient's satisfaction.     Darene Lamer Arvil Utz

## 2017-11-27 NOTE — Progress Notes (Signed)
Received from PACU at t his time, sleepy but easily arousable. Family at bedside

## 2017-11-27 NOTE — Transfer of Care (Signed)
Immediate Anesthesia Transfer of Care Note  Patient: [name]  Procedure(s) Performed: RIGHT TOTAL MASTECTOMY WITH SENTINEL LYMPH NODE BIOPSY (Right Breast) INSERTION PORT-A-CATH (Left Chest)  Patient Location: PACU  Anesthesia Type:General  Level of Consciousness: drowsy  Airway & Oxygen Therapy: Patient Spontanous Breathing and Patient connected to nasal cannula oxygen  Post-op Assessment: Report given to RN and Post -op Vital signs reviewed and stable  Post vital signs: Reviewed and stable  Last Vitals:  Vitals Value Taken Time  BP 92/57 11/27/2017  3:08 PM  Temp    Pulse 62 11/27/2017  3:09 PM  Resp 11 11/27/2017  3:09 PM  SpO2 98 % 11/27/2017  3:09 PM  Vitals shown include unvalidated device data.  Last Pain:  Vitals:   11/27/17 1058  TempSrc:   PainSc: 0-No pain      Patients Stated Pain Goal: 0 (00/76/22 6333)  Complications: No apparent anesthesia complications

## 2017-11-27 NOTE — Anesthesia Procedure Notes (Signed)
Procedure Name: Intubation Date/Time: 11/27/2017 12:32 PM Performed by: Neldon Newport, CRNA Pre-anesthesia Checklist: Timeout performed, Patient being monitored, Suction available and Emergency Drugs available Patient Re-evaluated:Patient Re-evaluated prior to induction Oxygen Delivery Method: Circle system utilized Preoxygenation: Pre-oxygenation with 100% oxygen Induction Type: IV induction Ventilation: Mask ventilation without difficulty and Oral airway inserted - appropriate to patient size Laryngoscope Size: Mac and 3 Grade View: Grade I Tube type: Oral Tube size: 7.0 mm Number of attempts: 1 Placement Confirmation: breath sounds checked- equal and bilateral,  positive ETCO2 and ETT inserted through vocal cords under direct vision Secured at: 21 cm Tube secured with: Tape Dental Injury: Teeth and Oropharynx as per pre-operative assessment

## 2017-11-27 NOTE — Op Note (Signed)
Preoperative Diagnosis: RIGHT BREAST CANCER  Postoprative Diagnosis: RIGHT BREAST CANCER  Procedure: Procedure(s): RIGHT TOTAL MASTECTOMY WITH DEEP AXILLARY SENTINEL LYMPH NODE BIOPSY INSERTION PORT-A-CATH with ultrasound guidance and fluoroscopy   Surgeon: Excell Seltzer T   Assistants: None  Anesthesia:  General endotracheal anesthesia  Indications: 67 year old female with multiple significant medical problems, with a new diagnosis of cancer of the right breast, multicentric. Clinical stage Ia, ER/PR positive, HER-2 positive.  After extensive preoperative work-up and discussion and consultation with medical oncology we have elected to proceed with initial surgical therapy consisting of right total mastectomy with axillary sentinel lymph node biopsy and placement of Port-A-Cath    Procedure Detail: In the holding area the patient underwent a pectoral block by anesthesia and underwent injection of 1 mCi of technetium sulfur colloid intradermally around the right nipple.  She was taken to the operating room, placed in the supine position on the operating table and general endotracheal anesthesia induced.  She received preoperative IV antibiotics.  PAS were placed.  She was positioned with her right arm carefully extended and left arm tucked.  The entire anterior neck chest axilla breasts and upper abdomen were widely sterilely prepped and draped.  Patient timeout was performed and correct procedure verified. Attention was turned initially to the mastectomy.  I used a transversely oriented elliptical incision encompassing the nipple areolar complex in the central breast skin.  Using the plasma blade skin and subcutaneous flaps were developed superiorly toward the clavicle, medially to the edge of the sternum, inferiorly to the inframammary crease and laterally out to the anterior border of the latissimus that was identified and dissected.  Dissection was deepened down to the chest wall.  The  breast was then reflected up off the chest wall working medial to lateral dissecting from attachments to the [employer] and serratus.  The dissection continued laterally dissecting the specimen anteriorly away from the anterior border of latissimus working up toward the axilla.  The specimen was detached from the lateral border of the pectoralis major and the clavipectoral fascia incised exposing the axillary contents.  Using the neoprobe for guidance I was able to identify a total of 5 sentinel lymph nodes with elevated counts.  1 or 2 of these were slightly enlarged but not obviously pathologic.  Following removal of these nodes there was no significant count in the axilla and I could not feel any adenopathy with the axilla widely exposed.  I then came across the low axilla with Kelly clamps tying with 3-0 Vicryl and the specimen was removed.  It was oriented and sent for permanent pathology.  The wound was thoroughly irrigated with saline and hemostasis assured.  A 19 Blake closed suction drain was brought out through a separate stab wound and left under both flaps.  The subcutaneous tissue was closed with interrupted 3-0 Vicryl and the skin with running subcuticular 5-0 Monocryl and Dermabond. Attention was then turned to the Port-A-Cath placement.  The right internal jugular vein was identified with ultrasound and was cannulated with a needle and guidewire under continuous ultrasound visualization.  Guidewire was confirmed by fluoroscopy in the vena cava.  The introducer was placed via the guidewire and the flush catheter placed via the introducer which was stripped away and the tip of the catheter positioned in the SVC by fluoroscopy.  A small transverse incision was made in the anterior chest wall and some cutaneous pocket created.  The catheter was tunneled subcutaneously to the pocket, trimmed to  length and attached to the port which was sutured to the anterior chest wall with 2-0 Prolene.  Fluoroscopy  again confirmed good position of the catheter.  The subtenons tissue was closed with interrupted 5-0 Monocryl and skin with some particular 5-0 Monocryl and Dermabond.  The catheter flushed and aspirated easily and was left flushed with concentrated heparin solution.  Dermabond was used over all incisions.  Fluffy dressing placed over the mastectomy incision and breast binder placed.  Sponge needle and instrument counts were correct.    Findings: Above  Estimated Blood Loss:  less than 50 mL         Drains: 18 round Blake under mastectomy flaps  Blood Given: none          Specimens: #1 right total mastectomy  #2 right axillary sentinel lymph nodes X 5        Complications:  * No complications entered in OR log *         Disposition: PACU - hemodynamically stable.         Condition: stable

## 2017-11-27 NOTE — Anesthesia Procedure Notes (Signed)
Anesthesia Regional Block: Pectoralis block   Pre-Anesthetic Checklist: ,, timeout performed, Correct Patient, Correct Site, Correct Laterality, Correct Procedure, Correct Position, site marked, Risks and benefits discussed,  Surgical consent,  Pre-op evaluation,  At surgeon's request and post-op pain management  Laterality: Right  Prep: chloraprep       Needles:  Injection technique: Single-shot  Needle Type: Echogenic Stimulator Needle     Needle Length: 5cm  Needle Gauge: 22     Additional Needles:   Procedures:, nerve stimulator,,, ultrasound used (permanent image in chart),,,,  Narrative:  Start time: 11/27/2017 11:46 AM End time: 11/27/2017 11:49 AM Injection made incrementally with aspirations every 5 mL.  Performed by: Personally  Anesthesiologist: Janeece Riggers, MD  Additional Notes: Functioning IV was confirmed and monitors were applied.  A 44mm 22ga Arrow echogenic stimulator needle was used. Sterile prep and drape,hand hygiene and sterile gloves were used. Ultrasound guidance: relevant anatomy identified, needle position confirmed, local anesthetic spread visualized around nerve(s)., vascular puncture avoided.  Image printed for medical record. Negative aspiration and negative test dose prior to incremental administration of local anesthetic. The patient tolerated the procedure well.

## 2017-11-27 NOTE — Anesthesia Preprocedure Evaluation (Addendum)
Anesthesia Evaluation  Patient identified by MRN, date of birth, ID band Patient awake    Reviewed: Allergy & Precautions, NPO status , Patient's Chart, lab work & pertinent test results  Airway Mallampati: II  TM Distance: >3 FB Neck ROM: Full    Dental  (+) Edentulous Upper, Poor Dentition, Missing, Upper Dentures, Partial Lower,    Pulmonary asthma , former smoker,    Pulmonary exam normal breath sounds clear to auscultation       Cardiovascular hypertension, Pt. on medications + angina with exertion + CAD, + Past MI, + Cardiac Stents and + DOE  Normal cardiovascular exam Rhythm:Regular Rate:Normal  ECHO 10/19  Left ventricle: The cavity size was normal. Wall thickness was   normal. Systolic function was normal. The estimated ejection   fraction was in the range of 50% to 55%. Wall motion was normal;   there were no regional wall motion abnormalities. Doppler   parameters are consistent with abnormal left ventricular   relaxation (grade 1 diastolic dysfunction). - Aortic valve: There was mild to moderate regurgitation.    Neuro/Psych PSYCHIATRIC DISORDERS Depression Right Carpal tunnel syndrome  Neuromuscular disease    GI/Hepatic GERD  Medicated and Controlled,(+)     substance abuse  cocaine use, Denies recent use   Endo/Other  diabetes, Well Controlled, Type 2, Oral Hypoglycemic AgentsHyperlipidemia  Renal/GU Renal InsufficiencyRenal disease  negative genitourinary   Musculoskeletal  (+) Arthritis ,   Abdominal   Peds  Hematology  (+) anemia ,   Anesthesia Other Findings   Reproductive/Obstetrics                           Anesthesia Physical  Anesthesia Plan  ASA: III  Anesthesia Plan: General   Post-op Pain Management: GA combined w/ Regional for post-op pain   Induction: Intravenous  PONV Risk Score and Plan: 2 and Ondansetron, Propofol infusion and Treatment may vary  due to age or medical condition  Airway Management Planned: LMA and Oral ETT  Additional Equipment: None  Intra-op Plan:   Post-operative Plan: Extubation in OR  Informed Consent: I have reviewed the patients History and Physical, chart, labs and discussed the procedure including the risks, benefits and alternatives for the proposed anesthesia with the patient or authorized representative who has indicated his/her understanding and acceptance.   Dental advisory given  Plan Discussed with: CRNA, Anesthesiologist and Surgeon  Anesthesia Plan Comments:         Anesthesia Quick Evaluation

## 2017-11-28 ENCOUNTER — Encounter (HOSPITAL_COMMUNITY): Payer: Self-pay | Admitting: General Practice

## 2017-11-28 ENCOUNTER — Other Ambulatory Visit: Payer: Self-pay

## 2017-11-28 DIAGNOSIS — I509 Heart failure, unspecified: Secondary | ICD-10-CM | POA: Diagnosis not present

## 2017-11-28 DIAGNOSIS — Z7984 Long term (current) use of oral hypoglycemic drugs: Secondary | ICD-10-CM | POA: Diagnosis not present

## 2017-11-28 DIAGNOSIS — C50411 Malignant neoplasm of upper-outer quadrant of right female breast: Secondary | ICD-10-CM | POA: Diagnosis not present

## 2017-11-28 DIAGNOSIS — E7800 Pure hypercholesterolemia, unspecified: Secondary | ICD-10-CM | POA: Diagnosis not present

## 2017-11-28 DIAGNOSIS — I252 Old myocardial infarction: Secondary | ICD-10-CM | POA: Diagnosis not present

## 2017-11-28 DIAGNOSIS — I2510 Atherosclerotic heart disease of native coronary artery without angina pectoris: Secondary | ICD-10-CM | POA: Diagnosis not present

## 2017-11-28 DIAGNOSIS — M1990 Unspecified osteoarthritis, unspecified site: Secondary | ICD-10-CM | POA: Diagnosis not present

## 2017-11-28 DIAGNOSIS — Z794 Long term (current) use of insulin: Secondary | ICD-10-CM | POA: Diagnosis not present

## 2017-11-28 DIAGNOSIS — R079 Chest pain, unspecified: Secondary | ICD-10-CM | POA: Diagnosis not present

## 2017-11-28 DIAGNOSIS — E119 Type 2 diabetes mellitus without complications: Secondary | ICD-10-CM | POA: Diagnosis not present

## 2017-11-28 DIAGNOSIS — I110 Hypertensive heart disease with heart failure: Secondary | ICD-10-CM | POA: Diagnosis not present

## 2017-11-28 DIAGNOSIS — K219 Gastro-esophageal reflux disease without esophagitis: Secondary | ICD-10-CM | POA: Diagnosis not present

## 2017-11-28 DIAGNOSIS — J449 Chronic obstructive pulmonary disease, unspecified: Secondary | ICD-10-CM | POA: Diagnosis not present

## 2017-11-28 DIAGNOSIS — Z170 Estrogen receptor positive status [ER+]: Secondary | ICD-10-CM | POA: Diagnosis not present

## 2017-11-28 DIAGNOSIS — Z87891 Personal history of nicotine dependence: Secondary | ICD-10-CM | POA: Diagnosis not present

## 2017-11-28 DIAGNOSIS — Z7982 Long term (current) use of aspirin: Secondary | ICD-10-CM | POA: Diagnosis not present

## 2017-11-28 LAB — GLUCOSE, CAPILLARY
GLUCOSE-CAPILLARY: 161 mg/dL — AB (ref 70–99)
Glucose-Capillary: 143 mg/dL — ABNORMAL HIGH (ref 70–99)
Glucose-Capillary: 208 mg/dL — ABNORMAL HIGH (ref 70–99)

## 2017-11-28 LAB — CBC
HCT: 32.7 % — ABNORMAL LOW (ref 36.0–46.0)
Hemoglobin: 9.6 g/dL — ABNORMAL LOW (ref 12.0–15.0)
MCH: 26.4 pg (ref 26.0–34.0)
MCHC: 29.4 g/dL — AB (ref 30.0–36.0)
MCV: 90.1 fL (ref 80.0–100.0)
Platelets: 338 10*3/uL (ref 150–400)
RBC: 3.63 MIL/uL — ABNORMAL LOW (ref 3.87–5.11)
RDW: 14.4 % (ref 11.5–15.5)
WBC: 11 10*3/uL — AB (ref 4.0–10.5)
nRBC: 0 % (ref 0.0–0.2)

## 2017-11-28 LAB — BASIC METABOLIC PANEL
ANION GAP: 8 (ref 5–15)
BUN: 26 mg/dL — AB (ref 8–23)
CHLORIDE: 104 mmol/L (ref 98–111)
CO2: 22 mmol/L (ref 22–32)
CREATININE: 1.89 mg/dL — AB (ref 0.44–1.00)
Calcium: 9.1 mg/dL (ref 8.9–10.3)
GFR, EST AFRICAN AMERICAN: 30 mL/min — AB (ref >60)
GFR, EST NON AFRICAN AMERICAN: 26 mL/min — AB (ref >60)
GLUCOSE: 145 mg/dL — AB (ref 70–99)
Potassium: 4.6 mmol/L (ref 3.5–5.1)
Sodium: 134 mmol/L — ABNORMAL LOW (ref 135–145)

## 2017-11-28 MED ORDER — ENOXAPARIN SODIUM 30 MG/0.3ML ~~LOC~~ SOLN: 30.0000 mg | SUBCUTANEOUS | Status: DC

## 2017-11-28 MED ORDER — OXYCODONE HCL 5 MG PO TABS: 5.0000 mg | ORAL_TABLET | Freq: Four times a day (QID) | ORAL | 0 refills | Status: DC | PRN

## 2017-11-28 NOTE — Discharge Summary (Signed)
Patient ID: DENISSE WHITENACK [id number] 67 y.o. Aug 05, 1950  11/27/2017  Discharge date and time: 11/28/2017   Admitting Physician: Edward Jolly  Discharge Physician: Edward Jolly  Admission Diagnoses: RIGHT BREAST CANCER  Discharge Diagnoses: Same  Operations: Procedure(s): RIGHT TOTAL MASTECTOMY WITH SENTINEL LYMPH NODE BIOPSY INSERTION PORT-A-CATH  Admission Condition: fair  Discharged Condition: fair  Indication for Admission: Patient with recent diagnosis of stage I multifocal HER-2 positive cancer of the right breast.  After extensive preoperative work-up and consultation and discussion we elected to proceed with right total mastectomy with sentinel lymph node biopsy and placement of Port-A-Cath as initial surgical therapy.  She was observed overnight following this procedure.  Hospital Course: Patient underwent an uneventful procedure as above.  Postoperative chest x-ray showed the Port-A-Cath in good position.  She was observed overnight.  She was stable.  The following morning lab work is unremarkable with stable CR I.  She has expected mild pain.  Wounds are clean without bleeding or hematoma.  She is felt ready for discharge.   Disposition: Home  Patient Instructions:  Allergies as of 11/28/2017   No Known Allergies     Medication List    TAKE these medications   albuterol (2.5 MG/3ML) 0.083% nebulizer solution Commonly known as:  PROVENTIL Take 2.5 mg by nebulization every 6 (six) hours as needed for wheezing or shortness of breath.   ALPRAZolam 0.25 MG tablet Commonly known as:  XANAX Take 0.25 mg by mouth 2 (two) times daily.   amLODipine 5 MG tablet Commonly known as:  NORVASC Take 5 mg by mouth daily.   aspirin 81 MG EC tablet Take 1 tablet (81 mg total) by mouth daily.   atorvastatin 20 MG tablet Commonly known as:  LIPITOR Take 20 mg by mouth daily.   budesonide-formoterol 160-4.5 MCG/ACT inhaler Commonly known as:   SYMBICORT Inhale 1 puff into the lungs 2 (two) times daily.   cyclobenzaprine 10 MG tablet Commonly known as:  FLEXERIL Take 1 tablet (10 mg total) by mouth 2 (two) times daily as needed for muscle spasms.   feeding supplement (ENSURE ENLIVE) Liqd Take 237 mLs by mouth 2 (two) times daily between meals.   Fish Oil 1000 MG Caps Take 1,000 mg by mouth daily.   HYDROcodone-acetaminophen 5-325 MG tablet Commonly known as:  NORCO/VICODIN 1-2 tabs po q6 hours prn pain   HYDROcodone-acetaminophen 5-325 MG tablet Commonly known as:  NORCO/VICODIN 1-2 tabs po q6 hours prn pain   Insulin Glargine 100 UNIT/ML Solostar Pen Commonly known as:  LANTUS Inject 20 Units into the skin daily at 10 pm. What changed:    how much to take  when to take this   loperamide 2 MG capsule Commonly known as:  IMODIUM Take 1 capsule (2 mg total) by mouth 4 (four) times daily as needed for diarrhea or loose stools.   metFORMIN 1000 MG tablet Commonly known as:  GLUCOPHAGE Take 500 mg by mouth 2 (two) times daily with a meal.   metoprolol succinate 50 MG 24 hr tablet Commonly known as:  TOPROL-XL Take 50 mg by mouth at bedtime. Take with or immediately following a meal.   nitroGLYCERIN 0.4 MG SL tablet Commonly known as:  NITROSTAT Place 1 tablet (0.4 mg total) under the tongue every 5 (five) minutes as needed. For chest pain What changed:    reasons to take this  additional instructions   oxyCODONE 5 MG immediate release tablet Commonly known as:  Oxy IR/ROXICODONE  Take 1 tablet (5 mg total) by mouth every 6 (six) hours as needed for severe pain.   pantoprazole 40 MG tablet Commonly known as:  PROTONIX Take 1 tablet (40 mg total) by mouth daily.       Activity: activity as tolerated Diet: diabetic diet Wound Care: Empty JP drain twice daily and record amount  Follow-up:  With [facility] in 2 weeks.  Signed: Edward Jolly MD, FACS  11/28/2017, 9:07 AM

## 2017-11-28 NOTE — Discharge Instructions (Signed)
[facility] °[phone] ° °MASTECTOMY: POST OP INSTRUCTIONS ° °Always review your discharge instruction sheet given to you by the facility where your surgery was performed. °IF YOU HAVE DISABILITY OR FAMILY LEAVE FORMS, YOU MUST BRING THEM TO THE OFFICE FOR PROCESSING.   °DO NOT GIVE THEM TO YOUR DOCTOR. °A prescription for pain medication may be given to you upon discharge.  Take your pain medication as prescribed, if needed.  If narcotic pain medicine is not needed, then you may take acetaminophen (Tylenol) or ibuprofen (Advil) as needed. °1. Take your usually prescribed medications unless otherwise directed. °2. If you need a refill on your pain medication, please contact your pharmacy.  They will contact our office to request authorization.  Prescriptions will not be filled after 5pm or on week-ends. °3. You should follow a light diet the first few days after arrival home, such as soup and crackers, etc.  Resume your normal diet the day after surgery. °4. Most patients will experience some swelling and bruising on the chest and underarm.  Ice packs will help.  Swelling and bruising can take several days to resolve.  °5. It is common to experience some constipation if taking pain medication after surgery.  Increasing fluid intake and taking a stool softener (such as Colace) will usually help or prevent this problem from occurring.  A mild laxative (Milk of Magnesia or Miralax) should be taken according to package instructions if there are no bowel movements after 48 hours. °6. Unless discharge instructions indicate otherwise, leave your bandage dry and in place until your next appointment in 3-5 days.  You may take a limited sponge bath.  No tube baths or showers until the drains are removed.  You may have steri-strips (small skin tapes) in place directly over the incision.  These strips should be left on the skin for 7-10 days.  If your surgeon used skin glue on the incision, you may  shower in 24 hours.  The glue will flake off over the next 2-3 weeks.  Any sutures or staples will be removed at the office during your follow-up visit. °7. DRAINS:  If you have drains in place, it is important to keep a list of the amount of drainage produced each day in your drains.  Before leaving the hospital, you should be instructed on drain care.  Call our office if you have any questions about your drains. °8. ACTIVITIES:  You may resume regular (light) daily activities beginning the next day--such as daily self-care, walking, climbing stairs--gradually increasing activities as tolerated.  You may have sexual intercourse when it is comfortable.  Refrain from any heavy lifting or straining until approved by your doctor. °a. You may drive when you are no longer taking prescription pain medication, you can comfortably wear a seatbelt, and you can safely maneuver your car and apply brakes. °b. RETURN TO WORK:  __________________________________________________________ °9. You should see your doctor in the office for a follow-up appointment approximately 3-5 days after your surgery.  Your doctor’s nurse will typically make your follow-up appointment when she calls you with your pathology report.  Expect your pathology report 2-3 business days after your surgery.  You may call to check if you do not hear from us after three days.   °10. OTHER INSTRUCTIONS: ______________________________________________________________________________________________ ____________________________________________________________________________________________ °WHEN TO CALL YOUR DOCTOR: °1. Fever over 101.0 °2. Nausea and/or vomiting °3. Extreme swelling or bruising °4. Continued bleeding from incision. °5. Increased pain, redness, or drainage from the incision. °  The clinic staff is available to answer your questions during regular business hours.  Please don’t hesitate to call and ask to speak to one of the nurses for clinical  concerns.  If you have a medical emergency, go to the nearest emergency room or call 911.  A surgeon from [facility] is always on call at the hospital. °[address], [address], [place], [place]  [address] ? [address] [address], [place], [place]   [address] °[phone] ? [phone] ? FAX [phone] °Web site: [email or web address] °

## 2017-11-28 NOTE — Progress Notes (Addendum)
Upon assessment and emptying of JP drain, a few large clots were noted. Contents of drain were emptied but the clot remained. JP bulb was changed and is now clean, dry, and intact. Patient teaching on JP drain with teach back and suggestion for milking the tube were made. Patient in stable condition awaiting transportation for discharge.

## 2017-11-28 NOTE — Progress Notes (Signed)
Patient given discharge instructions and patient verbalized understanding. Patient left unit in stable condition. JP drain clean, dry and intact upon discharge.

## 2017-11-28 NOTE — Anesthesia Postprocedure Evaluation (Signed)
Anesthesia Post Note  Patient: [name]  Procedure(s) Performed: RIGHT TOTAL MASTECTOMY WITH SENTINEL LYMPH NODE BIOPSY (Right Breast) INSERTION PORT-A-CATH (Left Chest)     Patient location during evaluation: PACU Anesthesia Type: General Level of consciousness: awake and alert Pain management: pain level controlled Vital Signs Assessment: post-procedure vital signs reviewed and stable Respiratory status: spontaneous breathing, nonlabored ventilation and respiratory function stable Cardiovascular status: blood pressure returned to baseline and stable Postop Assessment: no apparent nausea or vomiting Anesthetic complications: no    Last Vitals:  Vitals:   11/28/17 0250 11/28/17 0515  BP: 128/86 114/73  Pulse: 83 61  Resp: 16 16  Temp: 37.2 C 37.1 C  SpO2: 96% 98%    Last Pain:  Vitals:   11/28/17 0515  TempSrc: Oral  PainSc:                  Lynda Rainwater

## 2017-12-03 ENCOUNTER — Ambulatory Visit: Payer: Self-pay | Admitting: Hematology and Oncology

## 2017-12-05 ENCOUNTER — Telehealth: Payer: Self-pay | Admitting: Hematology and Oncology

## 2017-12-05 ENCOUNTER — Inpatient Hospital Stay: Payer: Medicare Other | Attending: Oncology | Admitting: Hematology and Oncology

## 2017-12-05 DIAGNOSIS — Z170 Estrogen receptor positive status [ER+]: Secondary | ICD-10-CM | POA: Insufficient documentation

## 2017-12-05 DIAGNOSIS — Z9011 Acquired absence of right breast and nipple: Secondary | ICD-10-CM | POA: Diagnosis not present

## 2017-12-05 DIAGNOSIS — C50411 Malignant neoplasm of upper-outer quadrant of right female breast: Secondary | ICD-10-CM | POA: Diagnosis not present

## 2017-12-05 NOTE — Telephone Encounter (Signed)
Gave pateint and niece avs and calendar. Patient only wanted to schedule through to March.

## 2017-12-05 NOTE — Assessment & Plan Note (Signed)
11/27/2017 right mastectomy: IDC grade 2, 1.6 cm, separate foci of DCIS intermediate grade, margins negative, 0/5 lymph nodes negative,ER 100%, PR 50%, Ki-67 15%, HER-2 positive ratio 2, copy #5 T1c N0 stage Ia  Pathology counseling: I discussed the final pathology report of the patient provided  a copy of this report. I discussed the margins as well as lymph node surgeries. We also discussed the final staging along with previously performed ER/PR and HER-2/neu testing.  Recommendation: 1.  Patient needs adjuvant chemotherapy with Taxol and Herceptin but I do not believe she can tolerate Taxol.  So we will plan on giving her Herceptin every 3 weeks for 1 year 2.  Adjuvant antiestrogen therapy with letrozole 2.5 mg daily

## 2017-12-05 NOTE — Progress Notes (Signed)
Patient Care Team: Lucianne Lei, MD as PCP - General (Family Medicine)  DIAGNOSIS:  Encounter Diagnosis  Name Primary?  . Malignant neoplasm of upper-outer quadrant of right breast in female, estrogen receptor positive ([place])     SUMMARY OF ONCOLOGIC HISTORY:   Malignant neoplasm of upper-outer quadrant of right breast in female, estrogen receptor positive ([place])   09/03/2017 Initial Diagnosis    Screening mammogram detected 1.4 cm right breast mass at 10 o'clock position, additional pleomorphic calcifications 2 cm posteriorly, additional loosely grouped calcifications 10.1 x 7.1 x 5.3 cm, single right axillary lymph node: Biopsy revealed DCIS ER 100%, PR 50%; IDC grade 2-3 ER 100%, PR 50%, Ki-67 15%, HER-2 positive ratio 2, copy #5, lymph node negative T2 N0 stage Ia clinical stage AJCC 8    11/27/2017 Surgery    Right mastectomy: IDC grade 2, 1.6 cm, separate foci of DCIS intermediate grade, margins negative, 0/5 lymph nodes negative,ER 100%, PR 50%, Ki-67 15%, HER-2 positive ratio 2, copy #5 T1c N0 stage Ia     CHIEF COMPLIANT: Follow-up after right mastectomy  INTERVAL HISTORY: [name] is a 67 year old with above-mentioned history of right breast cancer treated with mastectomy and is here today to discuss the pathology report she is here today accompanied by her daughter.  She has a right axillary drain which is still draining 40 mL/day.  She has pain and discomfort in the surgical scar tissue.  REVIEW OF SYSTEMS:   Constitutional: Generally frail Eyes: Denies blurriness of vision Ears, nose, mouth, throat, and face: Denies mucositis or sore throat Respiratory: Denies cough, dyspnea or wheezes Cardiovascular: Denies palpitation, chest discomfort Gastrointestinal:  Denies nausea, heartburn or change in bowel habits Skin: Denies abnormal skin rashes Lymphatics: Denies new lymphadenopathy or easy bruising Neurological:Denies numbness, tingling or new  weaknesses Behavioral/Psych: Mood is stable, no new changes  Extremities: No lower extremity edema   All other systems were reviewed with the patient and are negative.  I have reviewed the past medical history, past surgical history, social history and family history with the patient and they are unchanged from previous note.  ALLERGIES:  has No Known Allergies.  MEDICATIONS:  Current Outpatient Medications  Medication Sig Dispense Refill  . albuterol (PROVENTIL) (2.5 MG/3ML) 0.083% nebulizer solution Take 2.5 mg by nebulization every 6 (six) hours as needed for wheezing or shortness of breath.     . ALPRAZolam (XANAX) 0.25 MG tablet Take 0.25 mg by mouth 2 (two) times daily.     Marland Kitchen amLODipine (NORVASC) 5 MG tablet Take 5 mg by mouth daily.    Marland Kitchen aspirin EC 81 MG EC tablet Take 1 tablet (81 mg total) by mouth daily. 30 tablet 0  . atorvastatin (LIPITOR) 20 MG tablet Take 20 mg by mouth daily.    . budesonide-formoterol (SYMBICORT) 160-4.5 MCG/ACT inhaler Inhale 1 puff into the lungs 2 (two) times daily.     . cyclobenzaprine (FLEXERIL) 10 MG tablet Take 1 tablet (10 mg total) by mouth 2 (two) times daily as needed for muscle spasms. (Patient not taking: Reported on 11/05/2017) 20 tablet 0  . feeding supplement, ENSURE ENLIVE, (ENSURE ENLIVE) LIQD Take 237 mLs by mouth 2 (two) times daily between meals. 60 Bottle 0  . HYDROcodone-acetaminophen (NORCO) 5-325 MG tablet 1-2 tabs po q6 hours prn pain (Patient not taking: Reported on 11/05/2017) 20 tablet 0  . HYDROcodone-acetaminophen (NORCO) 5-325 MG tablet 1-2 tabs po q6 hours prn pain (Patient not taking: Reported on 11/05/2017) 20  tablet 0  . Insulin Glargine (LANTUS SOLOSTAR) 100 UNIT/ML Solostar Pen Inject 20 Units into the skin daily at 10 pm. (Patient taking differently: Inject 40 Units into the skin 2 (two) times daily. ) 15 mL 0  . loperamide (IMODIUM) 2 MG capsule Take 1 capsule (2 mg total) by mouth 4 (four) times daily as needed for  diarrhea or loose stools. (Patient not taking: Reported on 11/08/2017) 12 capsule 0  . metFORMIN (GLUCOPHAGE) 1000 MG tablet Take 500 mg by mouth 2 (two) times daily with a meal.     . metoprolol succinate (TOPROL-XL) 50 MG 24 hr tablet Take 50 mg by mouth at bedtime. Take with or immediately following a meal.    . nitroGLYCERIN (NITROSTAT) 0.4 MG SL tablet Place 1 tablet (0.4 mg total) under the tongue every 5 (five) minutes as needed. For chest pain (Patient taking differently: Place 0.4 mg under the tongue every 5 (five) minutes as needed for chest pain. ) 10 tablet 0  . Omega-3 Fatty Acids (FISH OIL) 1000 MG CAPS Take 1,000 mg by mouth daily.    Marland Kitchen oxyCODONE (OXY IR/ROXICODONE) 5 MG immediate release tablet Take 1 tablet (5 mg total) by mouth every 6 (six) hours as needed for severe pain. 20 tablet 0  . pantoprazole (PROTONIX) 40 MG tablet Take 1 tablet (40 mg total) by mouth daily. 60 tablet 0   No current facility-administered medications for this visit.     PHYSICAL EXAMINATION: ECOG PERFORMANCE STATUS: 1 - Symptomatic but completely ambulatory  Vitals:   12/05/17 1121  BP: 123/74  Pulse: 73  Resp: 16  Temp: 98.5 F (36.9 C)  SpO2: 100%   Filed Weights   12/05/17 1121  Weight: 131 lb 14.4 oz (59.8 kg)    GENERAL:alert, no distress and comfortable SKIN: skin color, texture, turgor are normal, no rashes or significant lesions EYES: normal, Conjunctiva are pink and non-injected, sclera clear OROPHARYNX:no exudate, no erythema and lips, buccal mucosa, and tongue normal  NECK: supple, thyroid normal size, non-tender, without nodularity LYMPH:  no palpable lymphadenopathy in the cervical, axillary or inguinal LUNGS: clear to auscultation and percussion with normal breathing effort HEART: regular rate & rhythm and no murmurs and no lower extremity edema ABDOMEN:abdomen soft, non-tender and normal bowel sounds MUSCULOSKELETAL:no cyanosis of digits and no clubbing  NEURO: alert &  oriented x 3 with fluent speech, no focal motor/sensory deficits EXTREMITIES: No lower extremity edema   LABORATORY DATA:  I have reviewed the data as listed CMP Latest Ref Rng & Units 11/28/2017 11/20/2017 09/12/2017  Glucose 70 - 99 mg/dL 145(H) 111(H) 81  BUN 8 - 23 mg/dL 26(H) 18 26(H)  Creatinine 0.44 - 1.00 mg/dL 1.89(H) 1.89(H) 1.90(H)  Sodium 135 - 145 mmol/L 134(L) 137 140  Potassium 3.5 - 5.1 mmol/L 4.6 4.3 4.4  Chloride 98 - 111 mmol/L 104 106 107  CO2 22 - 32 mmol/L 22 22 -  Calcium 8.9 - 10.3 mg/dL 9.1 9.7 -  Total Protein 6.5 - 8.1 g/dL - - -  Total Bilirubin 0.3 - 1.2 mg/dL - - -  Alkaline Phos 38 - 126 U/L - - -  AST 15 - 41 U/L - - -  ALT 14 - 54 U/L - - -    Lab Results  Component Value Date   WBC 11.0 (H) 11/28/2017   HGB 9.6 (L) 11/28/2017   HCT 32.7 (L) 11/28/2017   MCV 90.1 11/28/2017   PLT 338 11/28/2017   NEUTROABS  4.6 11/27/2015    ASSESSMENT & PLAN:  Malignant neoplasm of upper-outer quadrant of right breast in female, estrogen receptor positive ([place]) 11/27/2017 right mastectomy: IDC grade 2, 1.6 cm, separate foci of DCIS intermediate grade, margins negative, 0/5 lymph nodes negative,ER 100%, PR 50%, Ki-67 15%, HER-2 positive ratio 2, copy #5 T1c N0 stage Ia  Pathology counseling: I discussed the final pathology report of the patient provided  a copy of this report. I discussed the margins as well as lymph node surgeries. We also discussed the final staging along with previously performed ER/PR and HER-2/neu testing.  Recommendation: 1.  Patient needs adjuvant chemotherapy with Taxol and Herceptin but I do not believe she can tolerate Taxol.  So we will plan on giving her Herceptin every 3 weeks for 1 year 2.  Adjuvant antiestrogen therapy with letrozole 2.5 mg daily   Patient has very poor performance status and cannot tolerate Taxol.  She will only get Herceptin every 3 weeks for 1 year.  I believe she can tolerate Herceptin.  She will also start  antiestrogen therapy with letrozole.  I would like to start her treatment December 11 for convenience of the patient's daughter who brings her here for her appointments.   No orders of the defined types were placed in this encounter.  The patient has a good understanding of the overall plan. she agrees with it. she will call with any problems that may develop before the next visit here.   Harriette Ohara, MD 12/05/17

## 2017-12-09 ENCOUNTER — Telehealth: Payer: Self-pay | Admitting: *Deleted

## 2017-12-09 ENCOUNTER — Other Ambulatory Visit: Payer: Self-pay | Admitting: Hematology and Oncology

## 2017-12-09 ENCOUNTER — Inpatient Hospital Stay: Payer: Medicare Other

## 2017-12-09 DIAGNOSIS — C50411 Malignant neoplasm of upper-outer quadrant of right female breast: Secondary | ICD-10-CM

## 2017-12-09 DIAGNOSIS — Z170 Estrogen receptor positive status [ER+]: Principal | ICD-10-CM

## 2017-12-09 MED ORDER — LIDOCAINE-PRILOCAINE 2.5-2.5 % EX CREA: TOPICAL_CREAM | CUTANEOUS | 3 refills | Status: DC

## 2017-12-09 NOTE — Progress Notes (Signed)
START OFF PATHWAY REGIMEN - Breast   [id number]:Trastuzumab (Maintenance - w/Loading Dose) x 11 Cycles:   A cycle is every 21 days:     Trastuzumab-xxxx      Trastuzumab-xxxx   **Always confirm dose/schedule in your pharmacy ordering system**  Patient Characteristics: Postoperative without Neoadjuvant Therapy (Pathologic Staging), Invasive Disease, Adjuvant Therapy, HER2 Positive, ER Positive, Node Negative, pT1c, pN0/N50m Therapeutic Status: Postoperative without Neoadjuvant Therapy (Pathologic Staging) AJCC Grade: G2 AJCC N Category: pN0 AJCC M Category: cM0 ER Status: Positive (+) AJCC 8 Stage Grouping: IA HER2 Status: Positive (+) Oncotype Dx Recurrence Score: Not Appropriate AJCC T Category: pT1c PR Status: Positive (+) Intent of Therapy: Curative Intent, Discussed with Patient

## 2017-12-09 NOTE — Telephone Encounter (Signed)
Pt did not show for education appt for today.  Called mobile # & reached niece & she said that someone was supposed to call her to schedule.  She wishes to r/s.  Message sent to Schedulers to do so.

## 2017-12-18 ENCOUNTER — Encounter (HOSPITAL_COMMUNITY): Payer: Self-pay | Admitting: Emergency Medicine

## 2017-12-18 ENCOUNTER — Emergency Department (HOSPITAL_COMMUNITY): Payer: Medicare Other

## 2017-12-18 ENCOUNTER — Emergency Department (HOSPITAL_COMMUNITY)
Admission: EM | Admit: 2017-12-18 | Discharge: 2017-12-18 | Disposition: A | Discharge status: Home / Self Care | Payer: Medicare Other | Attending: Emergency Medicine | Admitting: Emergency Medicine

## 2017-12-18 ENCOUNTER — Telehealth: Payer: Self-pay | Admitting: *Deleted

## 2017-12-18 ENCOUNTER — Inpatient Hospital Stay: Payer: Medicare Other

## 2017-12-18 DIAGNOSIS — Z743 Need for continuous supervision: Secondary | ICD-10-CM | POA: Diagnosis not present

## 2017-12-18 DIAGNOSIS — Z7984 Long term (current) use of oral hypoglycemic drugs: Secondary | ICD-10-CM | POA: Insufficient documentation

## 2017-12-18 DIAGNOSIS — I10 Essential (primary) hypertension: Secondary | ICD-10-CM | POA: Diagnosis not present

## 2017-12-18 DIAGNOSIS — J45909 Unspecified asthma, uncomplicated: Secondary | ICD-10-CM | POA: Insufficient documentation

## 2017-12-18 DIAGNOSIS — D649 Anemia, unspecified: Secondary | ICD-10-CM

## 2017-12-18 DIAGNOSIS — G8918 Other acute postprocedural pain: Secondary | ICD-10-CM | POA: Diagnosis not present

## 2017-12-18 DIAGNOSIS — E119 Type 2 diabetes mellitus without complications: Secondary | ICD-10-CM | POA: Insufficient documentation

## 2017-12-18 DIAGNOSIS — Z79899 Other long term (current) drug therapy: Secondary | ICD-10-CM | POA: Diagnosis not present

## 2017-12-18 DIAGNOSIS — Z87891 Personal history of nicotine dependence: Secondary | ICD-10-CM | POA: Insufficient documentation

## 2017-12-18 DIAGNOSIS — K8020 Calculus of gallbladder without cholecystitis without obstruction: Secondary | ICD-10-CM | POA: Diagnosis not present

## 2017-12-18 DIAGNOSIS — Z7982 Long term (current) use of aspirin: Secondary | ICD-10-CM | POA: Insufficient documentation

## 2017-12-18 DIAGNOSIS — R079 Chest pain, unspecified: Secondary | ICD-10-CM | POA: Insufficient documentation

## 2017-12-18 DIAGNOSIS — R0789 Other chest pain: Secondary | ICD-10-CM | POA: Diagnosis not present

## 2017-12-18 DIAGNOSIS — R1031 Right lower quadrant pain: Secondary | ICD-10-CM | POA: Diagnosis not present

## 2017-12-18 DIAGNOSIS — R918 Other nonspecific abnormal finding of lung field: Secondary | ICD-10-CM | POA: Diagnosis not present

## 2017-12-18 DIAGNOSIS — N6489 Other specified disorders of breast: Secondary | ICD-10-CM

## 2017-12-18 DIAGNOSIS — L7634 Postprocedural seroma of skin and subcutaneous tissue following other procedure: Secondary | ICD-10-CM | POA: Diagnosis not present

## 2017-12-18 LAB — CBC WITH DIFFERENTIAL/PLATELET
Abs Immature Granulocytes: 0.02 10*3/uL (ref 0.00–0.07)
BASOS PCT: 1 %
Basophils Absolute: 0.1 10*3/uL (ref 0.0–0.1)
EOS ABS: 0.2 10*3/uL (ref 0.0–0.5)
EOS PCT: 4 %
HEMATOCRIT: 30.8 % — AB (ref 36.0–46.0)
Hemoglobin: 8.7 g/dL — ABNORMAL LOW (ref 12.0–15.0)
Immature Granulocytes: 0 %
LYMPHS ABS: 2.4 10*3/uL (ref 0.7–4.0)
Lymphocytes Relative: 43 %
MCH: 25.8 pg — AB (ref 26.0–34.0)
MCHC: 28.2 g/dL — AB (ref 30.0–36.0)
MCV: 91.4 fL (ref 80.0–100.0)
MONOS PCT: 13 %
Monocytes Absolute: 0.7 10*3/uL (ref 0.1–1.0)
NEUTROS PCT: 39 %
Neutro Abs: 2.2 10*3/uL (ref 1.7–7.7)
PLATELETS: 460 10*3/uL — AB (ref 150–400)
RBC: 3.37 MIL/uL — ABNORMAL LOW (ref 3.87–5.11)
RDW: 14.6 % (ref 11.5–15.5)
WBC: 5.6 10*3/uL (ref 4.0–10.5)
nRBC: 0 % (ref 0.0–0.2)

## 2017-12-18 LAB — COMPREHENSIVE METABOLIC PANEL
ALT: 15 U/L (ref 0–44)
ANION GAP: 9 (ref 5–15)
AST: 19 U/L (ref 15–41)
Albumin: 3.8 g/dL (ref 3.5–5.0)
Alkaline Phosphatase: 121 U/L (ref 38–126)
BUN: 20 mg/dL (ref 8–23)
CALCIUM: 9.8 mg/dL (ref 8.9–10.3)
CO2: 23 mmol/L (ref 22–32)
Chloride: 105 mmol/L (ref 98–111)
Creatinine, Ser: 1.72 mg/dL — ABNORMAL HIGH (ref 0.44–1.00)
GFR calc non Af Amer: 29 mL/min — ABNORMAL LOW (ref >60)
GFR, EST AFRICAN AMERICAN: 34 mL/min — AB (ref >60)
Glucose, Bld: 75 mg/dL (ref 70–99)
POTASSIUM: 4.5 mmol/L (ref 3.5–5.1)
SODIUM: 137 mmol/L (ref 135–145)
TOTAL PROTEIN: 8.1 g/dL (ref 6.5–8.1)
Total Bilirubin: 0.6 mg/dL (ref 0.3–1.2)

## 2017-12-18 LAB — TROPONIN I: Troponin I: <0.03 ng/mL (ref <0.03)

## 2017-12-18 LAB — LIPASE, BLOOD: LIPASE: 58 U/L — AB (ref 11–51)

## 2017-12-18 MED ORDER — SODIUM CHLORIDE 0.9 % IV BOLUS
500.0000 mL | Freq: Once | INTRAVENOUS | Status: AC
  Administered 2017-12-18: 500 mL via INTRAVENOUS

## 2017-12-18 MED ORDER — MORPHINE SULFATE (PF) 4 MG/ML IV SOLN
4.0000 mg | Freq: Once | INTRAVENOUS | Status: AC
  Administered 2017-12-18: 4 mg via INTRAVENOUS
  Filled 2017-12-18: qty 1

## 2017-12-18 MED ORDER — OXYCODONE HCL 5 MG PO TABS: 5.0000 mg | ORAL_TABLET | Freq: Four times a day (QID) | ORAL | 0 refills | Status: DC | PRN

## 2017-12-18 MED ORDER — IOPAMIDOL (ISOVUE-300) INJECTION 61%
80.0000 mL | Freq: Once | INTRAVENOUS | Status: AC | PRN
  Administered 2017-12-18: 80 mL via INTRAVENOUS

## 2017-12-18 NOTE — Discharge Instructions (Signed)
Please see your general surgeon in the next 3 or 4 days for a follow-up if your pain continues to be severe Return to the emergency department for severe or worsening pain fever or vomiting Oxycodone, 5 mg every 6 hours as needed for severe pain Please take Colace, 100 mg twice a day to prevent constipation while taking this pain medication.  Please obtain all of your results from medical records or have your doctors office obtain the results - share them with your doctor - you should be seen at your doctors office in the next 2 days. Call today to arrange your follow up. Take the medications as prescribed. Please review all of the medicines and only take them if you do not have an allergy to them. Please be aware that if you are taking birth control pills, taking other prescriptions, ESPECIALLY ANTIBIOTICS may make the birth control ineffective - if this is the case, either do not engage in sexual activity or use alternative methods of birth control such as condoms until you have finished the medicine and your family doctor says it is OK to restart them. If you are on a blood thinner such as COUMADIN, be aware that any other medicine that you take may cause the coumadin to either work too much, or not enough - you should have your coumadin level rechecked in next 7 days if this is the case.  ?  It is also a possibility that you have an allergic reaction to any of the medicines that you have been prescribed - Everybody reacts differently to medications and while MOST people have no trouble with most medicines, you may have a reaction such as nausea, vomiting, rash, swelling, shortness of breath. If this is the case, please stop taking the medicine immediately and contact your physician.  ?  You should return to the ER if you develop severe or worsening symptoms.

## 2017-12-18 NOTE — ED Provider Notes (Signed)
[facility] EMERGENCY DEPARTMENT Provider Note   CSN: [id number] Arrival date & time: 12/18/17  1115     History   Chief Complaint Chief Complaint  Patient presents with  . Chest Injury    HPI [name] is a 67 y.o. female.  HPI  67 year old female presents with right-sided chest pain.  Patient is not a good historian.  Last month she had a right-sided mastectomy and a left Port-A-Cath placed.  She states that her pain has been severe over the last 2 weeks at the surgical site.  She still has a drain that is still draining.  She ran out of her pain medicines, is not sure which one.  There is no fever, cough, shortness of breath, or vomiting.  She is also been having right lower quadrant abdominal pain during this time.  Pain is currently severe.  Past Medical History:  Diagnosis Date  . Angina   . Arthritis    HANDS"  . Asthma   . Cancer ([place]) 09/04/2017   Right Breast  . Carpal tunnel syndrome, bilateral 11/26/2016  . Cholelithiasis   . Cocaine abuse ([place]) 07/2012   per E.R. drug screen  . Coronary artery disease   . Diabetes mellitus without mention of complication    type 2  . Dysphagia    esophageal dysmotility on 03/2011 esophagram   . Fatty liver disease, nonalcoholic 6734   on Imaging.   Marland Kitchen GERD (gastroesophageal reflux disease)   . History of colonic polyps 2009   adenomatous 2009, HP in 2009, 2011, 2013.   Marland Kitchen Hyperlipidemia   . Hypertension   . Iron deficiency anemia   . Myocardial infarction ([place])    years ago, 6 stents placed  . 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 Cancer of overlapping sites of right breast ([place]) 11/27/2017  . Malignant neoplasm of upper-outer quadrant of right breast in female, estrogen receptor positive ([place]) 10/03/2017  . Carpal tunnel syndrome, bilateral 11/26/2016  . 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 Abdominal pain, epigastric     . Acute on chronic renal failure ([place]) 02/19/2015  . Diarrhea   . Nausea with vomiting   . Hyperglycemia 04/01/2014  . Hyperosmolar non-ketotic state in patient with type 2 diabetes mellitus ([place]) 04/01/2014  . Nausea vomiting and diarrhea 04/01/2014  . CAD (coronary artery disease) 04/01/2014  . Dehydration   . Diabetic ketoacidosis without coma associated with diabetes mellitus due to underlying condition ([place])   . High anion gap metabolic acidosis   . Cocaine abuse ([place])   . Toxic encephalopathy-Unlikely metabolic,?psychogenic 08/02/2012  . Bereavement 02/02/2012  . Depression 02/02/2012  . Iron deficiency anemia, unspecified 11/30/2011  . Nonspecific abnormal finding in stool contents 11/30/2011  . Benign neoplasm of colon 11/30/2011  . Cough 05/14/2011  . Chest pain 03/19/2011  . Anemia 03/19/2011  . Diabetes mellitus ([place]) 03/19/2011  . DOE (dyspnea on exertion) 03/19/2011  . Chest pain at rest 02/18/2011    Class: Acute  . Cholelithiasis 11/01/2010  . DM 04/08/2009  . HYPERLIPIDEMIA 04/08/2009  . Essential hypertension 04/08/2009  . MYOCARDIAL INFARCTION 04/08/2009  . Coronary atherosclerosis 04/08/2009  . GERD 04/08/2009  . FATTY LIVER DISEASE 04/08/2009  . COLONIC POLYPS, HX OF 04/08/2009    Past Surgical History:  Procedure Laterality Date  . ABDOMINAL ANGIOGRAM  02/20/2011   Procedure: ABDOMINAL ANGIOGRAM;  Surgeon: Clent Demark,  MD;  Location: [facility] CATH LAB;  Service: Cardiovascular;;  . CARPAL TUNNEL RELEASE Right 07/04/2017   Procedure: RIGHT CARPAL TUNNEL RELEASE;  Surgeon: Leanora Cover, MD;  Location: [facility];  Service: Orthopedics;  Laterality: Right;  . CARPAL TUNNEL RELEASE Left 09/12/2017   Procedure: LEFT CARPAL TUNNEL RELEASE;  Surgeon: Leanora Cover, MD;  Location: [facility];  Service: Orthopedics;  Laterality: Left;  . COLONOSCOPY  11/30/2011   Procedure: COLONOSCOPY;  Surgeon: Lafayette Dragon, MD;  Location: [facility]  ENDOSCOPY;  Service: Endoscopy;  Laterality: N/A;  . CORONARY ANGIOPLASTY WITH STENT PLACEMENT    . ESOPHAGOGASTRODUODENOSCOPY  11/30/2011   Procedure: ESOPHAGOGASTRODUODENOSCOPY (EGD);  Surgeon: Lafayette Dragon, MD;  Location: [facility];  Service: Endoscopy;  Laterality: N/A;  . ESOPHAGOGASTRODUODENOSCOPY N/A 02/23/2015   Procedure: ESOPHAGOGASTRODUODENOSCOPY (EGD);  Surgeon: Gatha Mayer, MD;  Location: [facility] ENDOSCOPY;  Service: Endoscopy;  Laterality: N/A;  . LEFT HEART CATHETERIZATION WITH CORONARY ANGIOGRAM N/A 02/20/2011   Procedure: LEFT HEART CATHETERIZATION WITH CORONARY ANGIOGRAM;  Surgeon: Clent Demark, MD;  Location: [facility] CATH LAB;  Service: Cardiovascular;  Laterality: N/A;  . MASTECTOMY Right 11/05/2017  . MASTECTOMY W/ SENTINEL NODE BIOPSY Right 11/27/2017  . MASTECTOMY W/ SENTINEL NODE BIOPSY Right 11/27/2017   Procedure: RIGHT TOTAL MASTECTOMY WITH SENTINEL LYMPH NODE BIOPSY;  Surgeon: Excell Seltzer, MD;  Location: [facility];  Service: General;  Laterality: Right;  . PORTACATH PLACEMENT Left 11/27/2017   Procedure: INSERTION PORT-A-CATH;  Surgeon: Excell Seltzer, MD;  Location: [facility];  Service: General;  Laterality: Left;  . RIGHT COLECTOMY  02/2007   for post polypectomy colonic perforation  . TUBAL LIGATION       OB History   None      Home Medications    Prior to Admission medications   Medication Sig Start Date End Date Taking? Authorizing Provider  albuterol (PROVENTIL) (2.5 MG/3ML) 0.083% nebulizer solution Take 2.5 mg by nebulization every 6 (six) hours as needed for wheezing or shortness of breath.     [provider]  ALPRAZolam Duanne Moron) 0.25 MG tablet Take 0.25 mg by mouth 2 (two) times daily.     [provider]  amLODipine (NORVASC) 5 MG tablet Take 5 mg by mouth daily.    [provider]  aspirin EC 81 MG EC tablet Take 1 tablet (81 mg total) by mouth daily. 02/06/12   Patrecia Pour, NP  atorvastatin (LIPITOR) 20 MG tablet  Take 20 mg by mouth daily.    [provider]  budesonide-formoterol (SYMBICORT) 160-4.5 MCG/ACT inhaler Inhale 1 puff into the lungs 2 (two) times daily.     [provider]  cyclobenzaprine (FLEXERIL) 10 MG tablet Take 1 tablet (10 mg total) by mouth 2 (two) times daily as needed for muscle spasms. Patient not taking: Reported on 11/05/2017 11/27/15   Leonard Schwartz, MD  feeding supplement, ENSURE ENLIVE, (ENSURE ENLIVE) LIQD Take 237 mLs by mouth 2 (two) times daily between meals. 02/24/15   Ghimire, Henreitta Leber, MD  HYDROcodone-acetaminophen [facility]) 5-325 MG tablet 1-2 tabs po q6 hours prn pain Patient not taking: Reported on 11/05/2017 07/04/17   Leanora Cover, MD  HYDROcodone-acetaminophen [facility]) 5-325 MG tablet 1-2 tabs po q6 hours prn pain Patient not taking: Reported on 11/05/2017 09/12/17   Leanora Cover, MD  Insulin Glargine (LANTUS SOLOSTAR) 100 UNIT/ML Solostar Pen Inject 20 Units into the skin daily at 10 pm. Patient taking differently: Inject 40 Units into the skin 2 (two)  times daily.  02/24/15   Ghimire, Henreitta Leber, MD  lidocaine-prilocaine (EMLA) cream Apply to affected area once 12/09/17   Nicholas Lose, MD  loperamide (IMODIUM) 2 MG capsule Take 1 capsule (2 mg total) by mouth 4 (four) times daily as needed for diarrhea or loose stools. Patient not taking: Reported on 11/08/2017 02/27/15   Marella Chimes, PA-C  metFORMIN (GLUCOPHAGE) 1000 MG tablet Take 500 mg by mouth 2 (two) times daily with a meal.     [provider]  metoprolol succinate (TOPROL-XL) 50 MG 24 hr tablet Take 50 mg by mouth at bedtime. Take with or immediately following a meal.    [provider]  nitroGLYCERIN (NITROSTAT) 0.4 MG SL tablet Place 1 tablet (0.4 mg total) under the tongue every 5 (five) minutes as needed. For chest pain Patient taking differently: Place 0.4 mg under the tongue every 5 (five) minutes as needed for chest pain.  02/06/12   Patrecia Pour, NP  Omega-3 Fatty  Acids (FISH OIL) 1000 MG CAPS Take 1,000 mg by mouth daily.    [provider]  oxyCODONE (OXY IR/ROXICODONE) 5 MG immediate release tablet Take 1 tablet (5 mg total) by mouth every 6 (six) hours as needed for severe pain. 11/28/17   Kinsinger, Arta Bruce, MD  pantoprazole (PROTONIX) 40 MG tablet Take 1 tablet (40 mg total) by mouth daily. 02/24/15   Ghimire, Henreitta Leber, MD    Family History Family History  Problem Relation Age of Onset  . Heart disease Mother   . Diabetes Mother   . Cancer Father        unsure what kind  . Diabetes Sister   . Colon cancer Neg Hx   . Breast cancer Neg Hx     Social History Social History   Tobacco Use  . Smoking status: Former Smoker    Packs/day: 0.50    Years: 47.00    Pack years: 23.50    Types: Cigarettes    Last attempt to quit: 05/09/2010    Years since quitting: 7.6  . Smokeless tobacco: Never Used  Substance Use Topics  . Alcohol use: No  . Drug use: Yes    Types: Cocaine    Comment: positive drug screen (cocaine, benzo's)07/2012 in emergency room     Allergies   Patient has no known allergies.   Review of Systems Review of Systems  Constitutional: Negative for fever.  HENT: Positive for congestion and rhinorrhea.   Respiratory: Negative for cough and shortness of breath.   Cardiovascular: Positive for chest pain.  Gastrointestinal: Positive for abdominal pain. Negative for vomiting.  All other systems reviewed and are negative.    Physical Exam Updated Vital Signs BP 138/65 (BP Location: Left Arm)   Pulse (!) 55   Temp (S) 98.8 F (37.1 C) (Rectal)   Resp 20   Ht 4\' 11"  (1.499 m)   Wt 58.1 kg   SpO2 95%   BMI 25.85 kg/m   Physical Exam  Constitutional: She appears well-developed and well-nourished. She appears distressed (in pain).  HENT:  Head: Normocephalic and atraumatic.  Right Ear: External ear normal.  Left Ear: External ear normal.  Nose: Nose normal.  Eyes: Right eye exhibits no discharge.  Left eye exhibits no discharge.  Cardiovascular: Normal rate, regular rhythm and normal heart sounds.  Pulmonary/Chest: Effort normal and breath sounds normal. She exhibits tenderness.    Abdominal: Soft. There is tenderness in the right lower quadrant.  Neurological: She  is alert.  Skin: Skin is warm and dry. She is not diaphoretic.  Psychiatric: Her mood appears not anxious.  Nursing note and vitals reviewed.    ED Treatments / Results  Labs (all labs ordered are listed, but only abnormal results are displayed) Labs Reviewed  COMPREHENSIVE METABOLIC PANEL - Abnormal; Notable for the following components:      Result Value   Creatinine, Ser 1.72 (*)    GFR calc non Af Amer 29 (*)    GFR calc Af Amer 34 (*)    All other components within normal limits  LIPASE, BLOOD - Abnormal; Notable for the following components:   Lipase 58 (*)    All other components within normal limits  CBC WITH DIFFERENTIAL/PLATELET - Abnormal; Notable for the following components:   RBC 3.37 (*)    Hemoglobin 8.7 (*)    HCT 30.8 (*)    MCH 25.8 (*)    MCHC 28.2 (*)    Platelets 460 (*)    All other components within normal limits  TROPONIN I  URINALYSIS, ROUTINE W REFLEX MICROSCOPIC    EKG EKG Interpretation  Date/Time:  Wednesday December 18 2017 11:32:31 EST Ventricular Rate:  66 PR Interval:    QRS Duration: 86 QT Interval:  389 QTC Calculation: 408 R Axis:   -2 Text Interpretation:  Sinus rhythm Anteroseptal infarct, age indeterminate no acute ST/T changes similar when compared to Aug 2019 Confirmed by Sherwood Gambler [phone]) on 12/18/2017 11:39:18 AM Also confirmed by Sherwood Gambler [phone]), editor Philomena Doheny [phone])  on 12/18/2017 3:25:42 PM   Radiology Dg Chest 2 View  Result Date: 12/18/2017 CLINICAL DATA:  Right-sided chest pain EXAM: CHEST - 2 VIEW COMPARISON:  11/27/2017 FINDINGS: There is a left-sided Port-A-Cath with the tip projecting over the confluence left subclavian  vein. There is no focal parenchymal opacity. There is no pleural effusion or pneumothorax. The heart and mediastinal contours are unremarkable. The osseous structures are unremarkable. IMPRESSION: No active cardiopulmonary disease. Electronically Signed   By: Kathreen Devoid   On: 12/18/2017 13:51    Procedures Procedures (including critical care time)  Medications Ordered in ED Medications  sodium chloride 0.9 % bolus 500 mL (0 mLs Intravenous Stopped 12/18/17 1422)  morphine 4 MG/ML injection 4 mg (4 mg Intravenous Given 12/18/17 1158)  morphine 4 MG/ML injection 4 mg (4 mg Intravenous Given 12/18/17 1431)     Initial Impression / Assessment and Plan / ED Course  I have reviewed the triage vital signs and the nursing notes.  Pertinent labs & imaging results that were available during my care of the patient were reviewed by me and considered in my medical decision making (see chart for details).     Patient will need CT of the chest to rule out deeper infection.  However this could all be postop pain.  Given the right lower quadrant abdominal pain I will also have them do her CT abdomen pelvis.  Labs near baseline.  Slight decrease in hemoglobin is probably postop.  Care to Dr. Sabra Heck with CTs pending.  Final Clinical Impressions(s) / ED Diagnoses   Final diagnoses:  None    ED Discharge Orders    None       Sherwood Gambler, MD 12/18/17 1601

## 2017-12-18 NOTE — ED Notes (Signed)
Patient given discharge instructions and verbalized understanding.  Patient stable to discharge at this time.  Patient is alert and oriented to baseline.  No distressed noted at this time.  All belongings taken with the patient at discharge.  Pt unable to sign due to computer pad being broken

## 2017-12-18 NOTE — ED Notes (Signed)
Pt ambulated to bathroom with some assistance. Pt had moderate strength, but required assistance from this tech for balance. Pt providing UA at this time. Son is also at bedside.   Pt missed urine collection hat; was informed that we are still waiting on a urine sample and that Pt needs to let staff know when she needs to use the bathroom next.

## 2017-12-18 NOTE — ED Triage Notes (Signed)
Per EMS pt is from home and complains of right sided CP 6/10.  Pt had a right sided mastectomy in October and was given pain meds and recently ran out of them.  Pt states they controlled her pain well.  NAD noted a this time EMS reports no meds were given pt AOx4

## 2017-12-18 NOTE — ED Provider Notes (Signed)
I personally examined the patient at change of shift, she is not tachycardic hypotensive febrile or at hypoxic.  She is feeling better, she was reassured that her CT scan shows no signs of deep tissue infection but just a large seroma, I inspected her tubing and the Jackson-Pratt drain and it appears to be draining a serous bloody material.  This also explains her slight drop in hemoglobin.  She appears hemodynamically stable and will be discharged home with pain medications, advised on stool softeners and close follow-up with her surgeon, she is agreeable as is the family member at the bedside.  They voiced their understanding of the indications for return   Noemi Chapel, MD 12/18/17 1749

## 2017-12-23 ENCOUNTER — Other Ambulatory Visit: Payer: Medicare Other

## 2017-12-23 ENCOUNTER — Telehealth (HOSPITAL_COMMUNITY): Payer: Self-pay | Admitting: Surgery

## 2017-12-23 DIAGNOSIS — C50811 Malignant neoplasm of overlapping sites of right female breast: Secondary | ICD-10-CM

## 2017-12-23 NOTE — Telephone Encounter (Signed)
Orders placed for Echocardiogram

## 2017-12-24 ENCOUNTER — Inpatient Hospital Stay: Payer: Medicare Other | Attending: Oncology

## 2017-12-24 ENCOUNTER — Encounter: Payer: Self-pay | Admitting: *Deleted

## 2017-12-24 ENCOUNTER — Other Ambulatory Visit: Payer: Self-pay

## 2017-12-24 ENCOUNTER — Encounter: Payer: Self-pay | Admitting: Hematology and Oncology

## 2017-12-24 MED ORDER — ONDANSETRON HCL 8 MG PO TABS: 8.0000 mg | ORAL_TABLET | Freq: Three times a day (TID) | ORAL | 0 refills | Status: DC | PRN

## 2017-12-24 NOTE — Progress Notes (Signed)
Met with patient and niece to introduce myself as Arboriculturist and to offer available resources. Patient has 2 insurances, therefore copay assistance is not needed.  Discussed one-time $1000 Radio broadcast assistant to assist with personal expenses while going through treatment. Advised once proof of income is submitted, decision is made regarding grant. Briefly discussed expenses that are covered and how they are submitted for payment. They verbalized understanding and have my card for additional questions or concerns.  Food was a concern as well. Provided a food bag from the support center and also discussed gift cards if not needed for transportation. They verbalized understanding. Referral staff message sent to social workers and [place] for [employer].

## 2017-12-24 NOTE — Progress Notes (Signed)
[place] Work  Clinical Social Work received a referral form the financial advocate for information on living wills and health care power of attorney.  CSW contacted patient at home and spoke with her niece.  Patient niece expressed interest in completing Healthcare living will.  A member of the CSW team will plan to meet with patient and family member at her next appointment (01/17/18).      Johnnye Lana, MSW, LCSW, OSW-C Clinical Social Worker [facility] [phone]

## 2017-12-25 ENCOUNTER — Telehealth: Payer: Self-pay | Admitting: Hematology and Oncology

## 2017-12-25 NOTE — Telephone Encounter (Signed)
Spoke with patient's niece and set her up with the program. Will meet with them on 12/13 and discuss the program and set up rides. Waiver will also be signed on 12/13.

## 2017-12-26 ENCOUNTER — Telehealth: Payer: Self-pay | Admitting: Hematology and Oncology

## 2017-12-26 NOTE — Telephone Encounter (Signed)
R/s appt per 11/19 sch message -Niece is aware of changes made

## 2017-12-30 DIAGNOSIS — E119 Type 2 diabetes mellitus without complications: Secondary | ICD-10-CM | POA: Diagnosis not present

## 2017-12-30 DIAGNOSIS — I25118 Atherosclerotic heart disease of native coronary artery with other forms of angina pectoris: Secondary | ICD-10-CM | POA: Diagnosis not present

## 2017-12-30 DIAGNOSIS — J45909 Unspecified asthma, uncomplicated: Secondary | ICD-10-CM | POA: Diagnosis not present

## 2017-12-30 DIAGNOSIS — I10 Essential (primary) hypertension: Secondary | ICD-10-CM | POA: Diagnosis not present

## 2018-01-14 ENCOUNTER — Other Ambulatory Visit: Payer: Self-pay | Admitting: *Deleted

## 2018-01-14 DIAGNOSIS — C50411 Malignant neoplasm of upper-outer quadrant of right female breast: Secondary | ICD-10-CM

## 2018-01-14 DIAGNOSIS — Z170 Estrogen receptor positive status [ER+]: Principal | ICD-10-CM

## 2018-01-14 MED ORDER — LIDOCAINE-PRILOCAINE 2.5-2.5 % EX CREA: TOPICAL_CREAM | CUTANEOUS | 3 refills | Status: DC

## 2018-01-17 ENCOUNTER — Inpatient Hospital Stay: Payer: Medicare Other | Attending: Oncology

## 2018-01-17 ENCOUNTER — Encounter: Payer: Self-pay | Admitting: General Practice

## 2018-01-17 ENCOUNTER — Telehealth: Payer: Self-pay

## 2018-01-17 NOTE — Progress Notes (Signed)
[place] CSW Progress Notes  Patient did not show for infusion today, CSW was scheduled to meet w her and complete Advance Directives. Can meet w patient when at [facility] for future appointments if needed.  Edwyna Shell, LCSW Clinical Social Worker Phone:  [phone]

## 2018-01-17 NOTE — Telephone Encounter (Signed)
Called niece r/t no-show for infusion appt this morning. States they were unable to come for treatment due to "weather." In-Basket message sent to scheduling department regarding reschedule.

## 2018-01-19 ENCOUNTER — Other Ambulatory Visit: Payer: Self-pay

## 2018-01-19 ENCOUNTER — Emergency Department (HOSPITAL_COMMUNITY)
Admission: EM | Admit: 2018-01-19 | Discharge: 2018-01-19 | Disposition: A | Discharge status: Home / Self Care | Payer: Medicare Other | Attending: Emergency Medicine | Admitting: Emergency Medicine

## 2018-01-19 ENCOUNTER — Encounter (HOSPITAL_COMMUNITY): Payer: Self-pay | Admitting: Emergency Medicine

## 2018-01-19 DIAGNOSIS — E785 Hyperlipidemia, unspecified: Secondary | ICD-10-CM | POA: Diagnosis not present

## 2018-01-19 DIAGNOSIS — I252 Old myocardial infarction: Secondary | ICD-10-CM | POA: Insufficient documentation

## 2018-01-19 DIAGNOSIS — Z5189 Encounter for other specified aftercare: Secondary | ICD-10-CM

## 2018-01-19 DIAGNOSIS — Z794 Long term (current) use of insulin: Secondary | ICD-10-CM | POA: Diagnosis not present

## 2018-01-19 DIAGNOSIS — Z87891 Personal history of nicotine dependence: Secondary | ICD-10-CM | POA: Diagnosis not present

## 2018-01-19 DIAGNOSIS — E119 Type 2 diabetes mellitus without complications: Secondary | ICD-10-CM | POA: Insufficient documentation

## 2018-01-19 DIAGNOSIS — I2510 Atherosclerotic heart disease of native coronary artery without angina pectoris: Secondary | ICD-10-CM | POA: Insufficient documentation

## 2018-01-19 DIAGNOSIS — Z79899 Other long term (current) drug therapy: Secondary | ICD-10-CM | POA: Diagnosis not present

## 2018-01-19 DIAGNOSIS — Z7982 Long term (current) use of aspirin: Secondary | ICD-10-CM | POA: Diagnosis not present

## 2018-01-19 DIAGNOSIS — I10 Essential (primary) hypertension: Secondary | ICD-10-CM | POA: Diagnosis not present

## 2018-01-19 DIAGNOSIS — J45909 Unspecified asthma, uncomplicated: Secondary | ICD-10-CM | POA: Diagnosis not present

## 2018-01-19 DIAGNOSIS — L7682 Other postprocedural complications of skin and subcutaneous tissue: Secondary | ICD-10-CM | POA: Diagnosis not present

## 2018-01-19 DIAGNOSIS — Z853 Personal history of malignant neoplasm of breast: Secondary | ICD-10-CM | POA: Diagnosis not present

## 2018-01-19 LAB — CBC WITH DIFFERENTIAL/PLATELET
Abs Immature Granulocytes: 0.02 10*3/uL (ref 0.00–0.07)
BASOS PCT: 1 %
Basophils Absolute: 0 10*3/uL (ref 0.0–0.1)
EOS ABS: 0.1 10*3/uL (ref 0.0–0.5)
Eosinophils Relative: 3 %
HCT: 31.2 % — ABNORMAL LOW (ref 36.0–46.0)
Hemoglobin: 8.7 g/dL — ABNORMAL LOW (ref 12.0–15.0)
IMMATURE GRANULOCYTES: 1 %
Lymphocytes Relative: 41 %
Lymphs Abs: 1.8 10*3/uL (ref 0.7–4.0)
MCH: 23.7 pg — ABNORMAL LOW (ref 26.0–34.0)
MCHC: 27.9 g/dL — ABNORMAL LOW (ref 30.0–36.0)
MCV: 85 fL (ref 80.0–100.0)
MONOS PCT: 13 %
Monocytes Absolute: 0.6 10*3/uL (ref 0.1–1.0)
NEUTROS PCT: 41 %
NRBC: 0 % (ref 0.0–0.2)
Neutro Abs: 1.8 10*3/uL (ref 1.7–7.7)
PLATELETS: 384 10*3/uL (ref 150–400)
RBC: 3.67 MIL/uL — ABNORMAL LOW (ref 3.87–5.11)
RDW: 14.7 % (ref 11.5–15.5)
WBC: 4.4 10*3/uL (ref 4.0–10.5)

## 2018-01-19 LAB — BASIC METABOLIC PANEL
ANION GAP: 12 (ref 5–15)
BUN: 14 mg/dL (ref 8–23)
CO2: 24 mmol/L (ref 22–32)
Calcium: 9.7 mg/dL (ref 8.9–10.3)
Chloride: 104 mmol/L (ref 98–111)
Creatinine, Ser: 1.55 mg/dL — ABNORMAL HIGH (ref 0.44–1.00)
GFR calc Af Amer: 39 mL/min — ABNORMAL LOW (ref >60)
GFR, EST NON AFRICAN AMERICAN: 34 mL/min — AB (ref >60)
GLUCOSE: 98 mg/dL (ref 70–99)
Potassium: 3.8 mmol/L (ref 3.5–5.1)
Sodium: 140 mmol/L (ref 135–145)

## 2018-01-19 MED ORDER — ACETAMINOPHEN 325 MG PO TABS
650.0000 mg | ORAL_TABLET | Freq: Once | ORAL | Status: AC
  Administered 2018-01-19: 650 mg via ORAL
  Filled 2018-01-19: qty 2

## 2018-01-19 NOTE — ED Notes (Signed)
ED Provider at bedside. 

## 2018-01-19 NOTE — Discharge Instructions (Addendum)
Please return for any problem.  Follow-up with your regular providers as instructed.  Your wound does not need to have a dressing change done today.  You can change the dressing on the wound tomorrow morning as instructed.  If you do have recurrent bleeding please packed the dressing as you have done and apply pressure for 20 to 30 minutes.  If your bleeding continues you absolutely may return to the ER for a repeat evaluation.

## 2018-01-19 NOTE — ED Triage Notes (Signed)
Patient presents to the ED by EMS with c/o Right mastectomy (October) site has been bleeding over the night. She reports the site was pouring blood. Saturated gauze in place.  Swarm in progress with EDP.

## 2018-01-19 NOTE — ED Provider Notes (Signed)
[facility] EMERGENCY DEPARTMENT Provider Note   CSN: [id number] Arrival date & time: 01/19/18  3151     History   Chief Complaint Chief Complaint  Patient presents with  . bleeding from surgical site    HPI [name] is a 67 y.o. female.  67 year old female with prior medical history as detailed below presents for evaluation of her wound.  Patient reports that she had a right-sided mastectomy performed on October 23.  She reports that she has had a wound dressing change yesterday by visiting nurse.  Immediately following the dressing change and overnight she was without complaint.  She woke up this morning and she noted that the dressing had some bloody drainage.  She remove the dressing and repacked the wound herself.  She arrives by EMS for evaluation.  She is anxious over the bleeding.  She denies associated fever, pain, chest pain, shortness of breath, nausea, vomiting, or other acute complaint.  She does take aspirin but denies other use of anticoagulants.    The history is provided by the patient and medical records.  Wound Check  This is a new problem. The current episode started 3 to 5 hours ago. The problem occurs rarely. The problem has been gradually improving. Pertinent negatives include no chest pain, no abdominal pain, no headaches and no shortness of breath. Nothing aggravates the symptoms. Nothing relieves the symptoms.    Past Medical History:  Diagnosis Date  . Angina   . Arthritis    HANDS"  . Asthma   . Cancer ([place]) 09/04/2017   Right Breast  . Carpal tunnel syndrome, bilateral 11/26/2016  . Cholelithiasis   . Cocaine abuse ([place]) 07/2012   per E.R. drug screen  . Coronary artery disease   . Diabetes mellitus without mention of complication    type 2  . Dysphagia    esophageal dysmotility on 03/2011 esophagram   . Fatty liver disease, nonalcoholic 7616   on Imaging.   Marland Kitchen GERD (gastroesophageal reflux disease)   . History of  colonic polyps 2009   adenomatous 2009, HP in 2009, 2011, 2013.   Marland Kitchen Hyperlipidemia   . Hypertension   . Iron deficiency anemia   . Myocardial infarction ([place])    years ago, 6 stents placed  . 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 Cancer of overlapping sites of right breast ([place]) 11/27/2017  . Malignant neoplasm of upper-outer quadrant of right breast in female, estrogen receptor positive ([place]) 10/03/2017  . Carpal tunnel syndrome, bilateral 11/26/2016  . 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 Abdominal pain, epigastric   . Acute on chronic renal failure ([place]) 02/19/2015  . Diarrhea   . Nausea with vomiting   . Hyperglycemia 04/01/2014  . Hyperosmolar non-ketotic state in patient with type 2 diabetes mellitus ([place]) 04/01/2014  . Nausea vomiting and diarrhea 04/01/2014  . CAD (coronary artery disease) 04/01/2014  . Dehydration   . Diabetic ketoacidosis without coma associated with diabetes mellitus due to underlying condition ([place])   . High anion gap metabolic acidosis   . Cocaine abuse ([place])   . Toxic encephalopathy-Unlikely metabolic,?psychogenic 08/02/2012  . Bereavement 02/02/2012  . Depression 02/02/2012  . Iron deficiency anemia, unspecified 11/30/2011  . Nonspecific abnormal finding in stool contents 11/30/2011  . Benign neoplasm of colon 11/30/2011  . Cough 05/14/2011  . Chest pain 03/19/2011  . Anemia 03/19/2011  . Diabetes mellitus (  [place]) 03/19/2011  . DOE (dyspnea on exertion) 03/19/2011  . Chest pain at rest 02/18/2011    Class: Acute  . Cholelithiasis 11/01/2010  . DM 04/08/2009  . HYPERLIPIDEMIA 04/08/2009  . Essential hypertension 04/08/2009  . MYOCARDIAL INFARCTION 04/08/2009  . Coronary atherosclerosis 04/08/2009  . GERD 04/08/2009  . FATTY LIVER DISEASE 04/08/2009  . COLONIC POLYPS, HX OF 04/08/2009    Past Surgical History:  Procedure Laterality Date  . ABDOMINAL ANGIOGRAM   02/20/2011   Procedure: ABDOMINAL ANGIOGRAM;  Surgeon: Clent Demark, MD;  Location: [facility] CATH LAB;  Service: Cardiovascular;;  . CARPAL TUNNEL RELEASE Right 07/04/2017   Procedure: RIGHT CARPAL TUNNEL RELEASE;  Surgeon: Leanora Cover, MD;  Location: [facility];  Service: Orthopedics;  Laterality: Right;  . CARPAL TUNNEL RELEASE Left 09/12/2017   Procedure: LEFT CARPAL TUNNEL RELEASE;  Surgeon: Leanora Cover, MD;  Location: [facility];  Service: Orthopedics;  Laterality: Left;  . COLONOSCOPY  11/30/2011   Procedure: COLONOSCOPY;  Surgeon: Lafayette Dragon, MD;  Location: [facility] ENDOSCOPY;  Service: Endoscopy;  Laterality: N/A;  . CORONARY ANGIOPLASTY WITH STENT PLACEMENT    . ESOPHAGOGASTRODUODENOSCOPY  11/30/2011   Procedure: ESOPHAGOGASTRODUODENOSCOPY (EGD);  Surgeon: Lafayette Dragon, MD;  Location: [facility];  Service: Endoscopy;  Laterality: N/A;  . ESOPHAGOGASTRODUODENOSCOPY N/A 02/23/2015   Procedure: ESOPHAGOGASTRODUODENOSCOPY (EGD);  Surgeon: Gatha Mayer, MD;  Location: [facility] ENDOSCOPY;  Service: Endoscopy;  Laterality: N/A;  . LEFT HEART CATHETERIZATION WITH CORONARY ANGIOGRAM N/A 02/20/2011   Procedure: LEFT HEART CATHETERIZATION WITH CORONARY ANGIOGRAM;  Surgeon: Clent Demark, MD;  Location: [facility] CATH LAB;  Service: Cardiovascular;  Laterality: N/A;  . MASTECTOMY Right 11/05/2017  . MASTECTOMY W/ SENTINEL NODE BIOPSY Right 11/27/2017  . MASTECTOMY W/ SENTINEL NODE BIOPSY Right 11/27/2017   Procedure: RIGHT TOTAL MASTECTOMY WITH SENTINEL LYMPH NODE BIOPSY;  Surgeon: Excell Seltzer, MD;  Location: [facility];  Service: General;  Laterality: Right;  . PORTACATH PLACEMENT Left 11/27/2017   Procedure: INSERTION PORT-A-CATH;  Surgeon: Excell Seltzer, MD;  Location: [facility];  Service: General;  Laterality: Left;  . RIGHT COLECTOMY  02/2007   for post polypectomy colonic perforation  . TUBAL LIGATION       OB History   No obstetric history on file.      Home  Medications    Prior to Admission medications   Medication Sig Start Date End Date Taking? Authorizing Provider  albuterol (PROVENTIL) (2.5 MG/3ML) 0.083% nebulizer solution Take 2.5 mg by nebulization every 6 (six) hours as needed for wheezing or shortness of breath.     [provider]  ALPRAZolam Duanne Moron) 0.25 MG tablet Take 0.25 mg by mouth 2 (two) times daily.     [provider]  amLODipine (NORVASC) 5 MG tablet Take 5 mg by mouth daily.    [provider]  aspirin EC 81 MG EC tablet Take 1 tablet (81 mg total) by mouth daily. 02/06/12   Patrecia Pour, NP  atorvastatin (LIPITOR) 20 MG tablet Take 20 mg by mouth daily.    [provider]  budesonide-formoterol (SYMBICORT) 160-4.5 MCG/ACT inhaler Inhale 1 puff into the lungs 2 (two) times daily.     [provider]  cyclobenzaprine (FLEXERIL) 10 MG tablet Take 1 tablet (10 mg total) by mouth 2 (two) times daily as needed for muscle spasms. Patient not taking: Reported on 11/05/2017 11/27/15   Leonard Schwartz, MD  feeding supplement, ENSURE ENLIVE, (ENSURE ENLIVE) LIQD Take 237 mLs  by mouth 2 (two) times daily between meals. 02/24/15   Ghimire, Henreitta Leber, MD  HYDROcodone-acetaminophen [facility]) 5-325 MG tablet 1-2 tabs po q6 hours prn pain Patient not taking: Reported on 11/05/2017 07/04/17   Leanora Cover, MD  HYDROcodone-acetaminophen [facility]) 5-325 MG tablet 1-2 tabs po q6 hours prn pain Patient not taking: Reported on 11/05/2017 09/12/17   Leanora Cover, MD  Insulin Glargine (LANTUS SOLOSTAR) 100 UNIT/ML Solostar Pen Inject 20 Units into the skin daily at 10 pm. Patient taking differently: Inject 40 Units into the skin 2 (two) times daily.  02/24/15   Ghimire, Henreitta Leber, MD  lidocaine-prilocaine (EMLA) cream Apply to affected area once 01/14/18   Magrinat, Virgie Dad, MD  loperamide (IMODIUM) 2 MG capsule Take 1 capsule (2 mg total) by mouth 4 (four) times daily as needed for diarrhea or loose stools. Patient  not taking: Reported on 11/08/2017 02/27/15   Marella Chimes, PA-C  metFORMIN (GLUCOPHAGE) 1000 MG tablet Take 500 mg by mouth 2 (two) times daily with a meal.     [provider]  metoprolol succinate (TOPROL-XL) 50 MG 24 hr tablet Take 50 mg by mouth at bedtime. Take with or immediately following a meal.    [provider]  nitroGLYCERIN (NITROSTAT) 0.4 MG SL tablet Place 1 tablet (0.4 mg total) under the tongue every 5 (five) minutes as needed. For chest pain Patient taking differently: Place 0.4 mg under the tongue every 5 (five) minutes as needed for chest pain.  02/06/12   Patrecia Pour, NP  Omega-3 Fatty Acids (FISH OIL) 1000 MG CAPS Take 1,000 mg by mouth daily.    [provider]  ondansetron (ZOFRAN) 8 MG tablet Take 1 tablet (8 mg total) by mouth every 8 (eight) hours as needed for nausea or vomiting. 12/24/17   Nicholas Lose, MD  oxyCODONE (ROXICODONE) 5 MG immediate release tablet Take 1 tablet (5 mg total) by mouth every 6 (six) hours as needed for severe pain. 12/18/17   Noemi Chapel, MD  pantoprazole (PROTONIX) 40 MG tablet Take 1 tablet (40 mg total) by mouth daily. 02/24/15   Ghimire, Henreitta Leber, MD    Family History Family History  Problem Relation Age of Onset  . Heart disease Mother   . Diabetes Mother   . Cancer Father        unsure what kind  . Diabetes Sister   . Colon cancer Neg Hx   . Breast cancer Neg Hx     Social History Social History   Tobacco Use  . Smoking status: Former Smoker    Packs/day: 0.50    Years: 47.00    Pack years: 23.50    Types: Cigarettes    Last attempt to quit: 05/09/2010    Years since quitting: 7.7  . Smokeless tobacco: Never Used  Substance Use Topics  . Alcohol use: No  . Drug use: Not Currently    Types: Cocaine    Comment: positive drug screen (cocaine, benzo's)07/2012 in emergency room     Allergies   Patient has no known allergies.   Review of Systems Review of Systems  Respiratory:  Negative for shortness of breath.   Cardiovascular: Negative for chest pain.  Gastrointestinal: Negative for abdominal pain.  Neurological: Negative for headaches.  All other systems reviewed and are negative.    Physical Exam Updated Vital Signs BP (!) 188/76   Pulse 91   Temp 99.2 F (37.3 C) (Oral)   Resp 20  SpO2 100%   Physical Exam Vitals signs and nursing note reviewed.  Constitutional:      General: She is not in acute distress.    Appearance: She is well-developed.  HENT:     Head: Normocephalic and atraumatic.  Eyes:     Conjunctiva/sclera: Conjunctivae normal.     Pupils: Pupils are equal, round, and reactive to light.  Neck:     Musculoskeletal: Normal range of motion and neck supple.  Cardiovascular:     Rate and Rhythm: Normal rate and regular rhythm.     Heart sounds: Normal heart sounds.  Pulmonary:     Effort: Pulmonary effort is normal. No respiratory distress.     Breath sounds: Normal breath sounds.  Abdominal:     General: There is no distension.     Palpations: Abdomen is soft.     Tenderness: There is no abdominal tenderness.  Musculoskeletal: Normal range of motion.        General: No deformity.  Skin:    General: Skin is warm and dry.     Comments: Right mastectomy incision is without surrounding erythema.  There is mild dehiscence to the lateral aspect of the wound which is packed with gauze.  There is no active bleeding noted.  There is no expressible purulence noted.  There is no significant tenderness overlying the wound itself.  Neurological:     Mental Status: She is alert and oriented to person, place, and time.      ED Treatments / Results  Labs (all labs ordered are listed, but only abnormal results are displayed) Labs Reviewed  CBC WITH DIFFERENTIAL/PLATELET - Abnormal; Notable for the following components:      Result Value   RBC 3.67 (*)    Hemoglobin 8.7 (*)    HCT 31.2 (*)    MCH 23.7 (*)    MCHC 27.9 (*)    All  other components within normal limits  BASIC METABOLIC PANEL - Abnormal; Notable for the following components:   Creatinine, Ser 1.55 (*)    GFR calc non Af Amer 34 (*)    GFR calc Af Amer 39 (*)    All other components within normal limits    EKG None  Radiology No results found.  Procedures Procedures (including critical care time)  Medications Ordered in ED Medications  acetaminophen (TYLENOL) tablet 650 mg (has no administration in time range)     Initial Impression / Assessment and Plan / ED Course  I have reviewed the triage vital signs and the nursing notes.  Pertinent labs & imaging results that were available during my care of the patient were reviewed by me and considered in my medical decision making (see chart for details).     MDM  Screen complete  Patient is presenting for evaluation of bleeding from her right mastectomy wound.  Patient's mastectomy was performed on 10/23.  She reports that she had scant bleeding to the wound site after a dressing change yesterday.  There is no active bleeding on evaluation today.  Hemoglobin is stable.  Patient's vitals are stable. No other significant abnormality seen on her screening labs.  Patient's dressing was changed in the ED.  She appears to be stable for discharge.  She understands need for close follow-up.  Strict return precautions given and understood.    Final Clinical Impressions(s) / ED Diagnoses   Final diagnoses:  Visit for wound check    ED Discharge Orders    None  Valarie Merino, MD 01/19/18 0930

## 2018-01-19 NOTE — ED Notes (Signed)
Pt verbalized understanding of discharge instructions and denies any further questions at this time.   

## 2018-01-20 ENCOUNTER — Encounter: Payer: Self-pay | Admitting: Hematology and Oncology

## 2018-01-20 ENCOUNTER — Telehealth: Payer: Self-pay | Admitting: Hematology and Oncology

## 2018-01-20 NOTE — Telephone Encounter (Signed)
Scheduled appt per sch message 12/13 - patient niece is aware of appt r/s to 12/20 @ 8:30.   Patient niece stated she has had some difficulty as far as transportation because she and her mom are the only ones that are able to bring pt to and from appts , and they only have one car at the moment - message sent to Ginette Otto for possible transportation assistance .   States she may have to r/s 12/20 appt if she isn't able to get help.

## 2018-01-20 NOTE — Telephone Encounter (Signed)
Spoke with patient's niece -   Patient just needed a ride to the appointment on 12/20 because her niece isn't able to bring her.   She wanted for her appointment to be moved to 10am or later - patient doesn't wake up in time to make an 8:30 appointment.   Sent message to Marianne Sofia telling her the details of the conversation.

## 2018-01-23 ENCOUNTER — Telehealth: Payer: Self-pay | Admitting: Hematology and Oncology

## 2018-01-23 NOTE — Telephone Encounter (Signed)
R/s apt per 12/17 sch message - per patient unable to come in 12/30 in the AM - per Rn May okay to reschedule to next week - pt is aware of new time and knows to get an updated schedule day of treatment.

## 2018-01-24 ENCOUNTER — Inpatient Hospital Stay: Payer: Medicare Other

## 2018-01-31 ENCOUNTER — Other Ambulatory Visit: Payer: Self-pay

## 2018-01-31 ENCOUNTER — Telehealth: Payer: Self-pay

## 2018-01-31 ENCOUNTER — Ambulatory Visit: Payer: Self-pay

## 2018-01-31 ENCOUNTER — Ambulatory Visit: Payer: Self-pay | Admitting: Hematology and Oncology

## 2018-01-31 NOTE — Telephone Encounter (Signed)
Left message for patient's niece to call back regarding appointment on 02/01/2018.  Voicemail was left on educators phone regarding need for appointment.

## 2018-01-31 NOTE — Telephone Encounter (Signed)
Spoke with home health nurse [place], regarding [name].  [name] reported [name] is having drainage, increased pain, and redness at mastectomy site. [name] reached out to surgeons office regarding this and obtained antibiotic order. Additional concerns were voiced with need for social work, [facility] will have social work come out to [name]'s home next week.    This nurse called [name] directly. [name] was unaware of appointment on 02/01/2018 and is not able to come d/t transportation issues.  [name] voiced that she would like to be called directly for future appointments.  [name] stated family is not keeping her directly informed regarding appointments and treatment.  Nurse informed [name] we could reach out for assistance in transportation. [name] agreed and very thankful.  Will send appropriate messages and follow up with [name] next week to confirm transportation arrangements.

## 2018-02-01 ENCOUNTER — Inpatient Hospital Stay: Payer: Medicare Other

## 2018-02-03 ENCOUNTER — Telehealth: Payer: Self-pay

## 2018-02-03 ENCOUNTER — Telehealth: Payer: Self-pay | Admitting: *Deleted

## 2018-02-03 NOTE — Telephone Encounter (Signed)
Returned patient's niece call.  Niece is calling to obtain clarification on why appointment was canceled on Saturday.  Nurse informed niece that patient was spoke with on Friday afternoon.  Patient reported not knowing about appointment and not having transportation.  Niece voiced that she needs to be the primary contact with appointments etc.  Nurse informed niece that she was first person that attempt was made for appointment.  Will have AD review and scheduled accordingly.  Niece voiced understanding and agreement.

## 2018-02-03 NOTE — Telephone Encounter (Signed)
"  KIERSTYN BARANOWSKI niece Dorena Cookey [phone]).  Would like to know why my aunt's appointment on Saturday was cancelled and why no one called to notify me because she is hard of hearing.  She needs treatments started.  I need someone to help me, does she need a new doctor, if I call at 9:00 am the nurse should call before walking out the door at 4:00 pm is the voicemail message.  No one left me any messages."  Transferred to collaborative for assistance with this request.

## 2018-02-06 ENCOUNTER — Telehealth: Payer: Self-pay | Admitting: Hematology and Oncology

## 2018-02-06 ENCOUNTER — Other Ambulatory Visit: Payer: Self-pay

## 2018-02-06 ENCOUNTER — Ambulatory Visit: Payer: Self-pay

## 2018-02-06 ENCOUNTER — Ambulatory Visit: Payer: Self-pay | Admitting: Hematology and Oncology

## 2018-02-06 NOTE — Telephone Encounter (Signed)
Left voicemail for patient regarding transportation for tomorrow. It is scheduled and should get a phone call with all the details. Left my information if she had questions.

## 2018-02-06 NOTE — Telephone Encounter (Signed)
Added tx 1/3 (inf log - MX). Spoke with patient and Solicitor re ride for patient.

## 2018-02-07 ENCOUNTER — Inpatient Hospital Stay: Payer: Medicare Other | Attending: Oncology

## 2018-02-07 VITALS — BP 110/67 | HR 80 | Temp 99.4°F | Resp 18

## 2018-02-07 DIAGNOSIS — Z79811 Long term (current) use of aromatase inhibitors: Secondary | ICD-10-CM | POA: Insufficient documentation

## 2018-02-07 DIAGNOSIS — C50411 Malignant neoplasm of upper-outer quadrant of right female breast: Secondary | ICD-10-CM | POA: Diagnosis present

## 2018-02-07 DIAGNOSIS — Z9012 Acquired absence of left breast and nipple: Secondary | ICD-10-CM | POA: Insufficient documentation

## 2018-02-07 DIAGNOSIS — Z5112 Encounter for antineoplastic immunotherapy: Secondary | ICD-10-CM | POA: Insufficient documentation

## 2018-02-07 DIAGNOSIS — Z170 Estrogen receptor positive status [ER+]: Secondary | ICD-10-CM | POA: Diagnosis not present

## 2018-02-07 MED ORDER — ACETAMINOPHEN 325 MG PO TABS
650.0000 mg | ORAL_TABLET | Freq: Once | ORAL | Status: AC
  Administered 2018-02-07: 650 mg via ORAL

## 2018-02-07 MED ORDER — SODIUM CHLORIDE 0.9 % IV SOLN
Freq: Once | INTRAVENOUS | Status: AC
  Administered 2018-02-07: 13:00:00 via INTRAVENOUS
  Filled 2018-02-07: qty 250

## 2018-02-07 MED ORDER — DIPHENHYDRAMINE HCL 25 MG PO CAPS
50.0000 mg | ORAL_CAPSULE | Freq: Once | ORAL | Status: AC
  Administered 2018-02-07: 50 mg via ORAL

## 2018-02-07 MED ORDER — HEPARIN SOD (PORK) LOCK FLUSH 100 UNIT/ML IV SOLN
500.0000 [IU] | Freq: Once | INTRAVENOUS | Status: AC | PRN
  Administered 2018-02-07: 500 [IU]
  Filled 2018-02-07: qty 5

## 2018-02-07 MED ORDER — TRASTUZUMAB CHEMO 150 MG IV SOLR
8.0000 mg/kg | Freq: Once | INTRAVENOUS | Status: AC
  Administered 2018-02-07: 483 mg via INTRAVENOUS
  Filled 2018-02-07: qty 23

## 2018-02-07 MED ORDER — DIPHENHYDRAMINE HCL 25 MG PO CAPS
ORAL_CAPSULE | ORAL | Status: AC
  Filled 2018-02-07: qty 2

## 2018-02-07 MED ORDER — SODIUM CHLORIDE 0.9% FLUSH
10.0000 mL | INTRAVENOUS | Status: DC | PRN
  Administered 2018-02-07: 10 mL
  Filled 2018-02-07: qty 10

## 2018-02-07 MED ORDER — ACETAMINOPHEN 325 MG PO TABS
ORAL_TABLET | ORAL | Status: AC
  Filled 2018-02-07: qty 2

## 2018-02-07 NOTE — Patient Instructions (Addendum)
[facility] Discharge Instructions for Patients Receiving Chemotherapy  Today you received the following chemotherapy agents: Herceptin.  To help prevent nausea and vomiting after your treatment, we encourage you to take your nausea medication as directed.   If you develop nausea and vomiting that is not controlled by your nausea medication, call the clinic.   BELOW ARE SYMPTOMS THAT SHOULD BE REPORTED IMMEDIATELY:  *FEVER GREATER THAN 100.5 F  *CHILLS WITH OR WITHOUT FEVER  NAUSEA AND VOMITING THAT IS NOT CONTROLLED WITH YOUR NAUSEA MEDICATION  *UNUSUAL SHORTNESS OF BREATH  *UNUSUAL BRUISING OR BLEEDING  TENDERNESS IN MOUTH AND THROAT WITH OR WITHOUT PRESENCE OF ULCERS  *URINARY PROBLEMS  *BOWEL PROBLEMS  UNUSUAL RASH Items with * indicate a potential emergency and should be followed up as soon as possible.  Feel free to call the clinic should you have any questions or concerns. The clinic phone number is [phone].  Please show the CHEMO ALERT CARD at check-in to the Emergency Department and triage nurse.  Trastuzumab injection for infusion What is this medicine? TRASTUZUMAB (tras TOO zoo mab) is a monoclonal antibody. It is used to treat breast cancer and stomach cancer. This medicine may be used for other purposes; ask your health care provider or pharmacist if you have questions. COMMON BRAND NAME(S): Herceptin, KANJINTI, OGIVRI What should I tell my health care provider before I take this medicine? They need to know if you have any of these conditions: -heart disease -heart failure -lung or breathing disease, like asthma -an unusual or allergic reaction to trastuzumab, benzyl alcohol, or other medications, foods, dyes, or preservatives -pregnant or trying to get pregnant -breast-feeding How should I use this medicine? This drug is given as an infusion into a vein. It is administered in a hospital or clinic by a specially trained health care  professional. Talk to your pediatrician regarding the use of this medicine in children. This medicine is not approved for use in children. Overdosage: If you think you have taken too much of this medicine contact a poison control center or emergency room at once. NOTE: This medicine is only for you. Do not share this medicine with others. What if I miss a dose? It is important not to miss a dose. Call your doctor or health care professional if you are unable to keep an appointment. What may interact with this medicine? This medicine may interact with the following medications: -certain types of chemotherapy, such as daunorubicin, doxorubicin, epirubicin, and idarubicin This list may not describe all possible interactions. Give your health care provider a list of all the medicines, herbs, non-prescription drugs, or dietary supplements you use. Also tell them if you smoke, drink alcohol, or use illegal drugs. Some items may interact with your medicine. What should I watch for while using this medicine? Visit your doctor for checks on your progress. Report any side effects. Continue your course of treatment even though you feel ill unless your doctor tells you to stop. Call your doctor or health care professional for advice if you get a fever, chills or sore throat, or other symptoms of a cold or flu. Do not treat yourself. Try to avoid being around people who are sick. You may experience fever, chills and shaking during your first infusion. These effects are usually mild and can be treated with other medicines. Report any side effects during the infusion to your health care professional. Fever and chills usually do not happen with later infusions. Do not become   pregnant while taking this medicine or for 7 months after stopping it. Women should inform their doctor if they wish to become pregnant or think they might be pregnant. Women of child-bearing potential will need to have a negative pregnancy test  before starting this medicine. There is a potential for serious side effects to an unborn child. Talk to your health care professional or pharmacist for more information. Do not breast-feed an infant while taking this medicine or for 7 months after stopping it. Women must use effective birth control with this medicine. What side effects may I notice from receiving this medicine? Side effects that you should report to your doctor or health care professional as soon as possible: -allergic reactions like skin rash, itching or hives, swelling of the face, lips, or tongue -chest pain or palpitations -cough -dizziness -feeling faint or lightheaded, falls -fever -general ill feeling or flu-like symptoms -signs of worsening heart failure like breathing problems; swelling in your legs and feet -unusually weak or tired Side effects that usually do not require medical attention (report to your doctor or health care professional if they continue or are bothersome): -bone pain -changes in taste -diarrhea -joint pain -nausea/vomiting -weight loss This list may not describe all possible side effects. Call your doctor for medical advice about side effects. You may report side effects to FDA at 1-800-FDA-1088. Where should I keep my medicine? This drug is given in a hospital or clinic and will not be stored at home. NOTE: This sheet is a summary. It may not cover all possible information. If you have questions about this medicine, talk to your doctor, pharmacist, or health care provider.  2019 Elsevier/Gold Standard (2016-01-17 14:37:52)  

## 2018-02-11 ENCOUNTER — Telehealth: Payer: Self-pay | Admitting: Hematology and Oncology

## 2018-02-11 NOTE — Telephone Encounter (Signed)
Called patient per 1/3 sch message - left message for patient with appt date and time

## 2018-02-17 ENCOUNTER — Ambulatory Visit (HOSPITAL_BASED_OUTPATIENT_CLINIC_OR_DEPARTMENT_OTHER)
Admission: RE | Admit: 2018-02-17 | Discharge: 2018-02-17 | Disposition: A | Discharge status: Home / Self Care | Payer: Medicare Other | Source: Ambulatory Visit | Attending: Internal Medicine | Admitting: Internal Medicine

## 2018-02-17 ENCOUNTER — Ambulatory Visit (HOSPITAL_COMMUNITY)
Admission: RE | Admit: 2018-02-17 | Discharge: 2018-02-17 | Disposition: A | Discharge status: Home / Self Care | Payer: Medicare Other | Source: Ambulatory Visit | Attending: Internal Medicine | Admitting: Internal Medicine

## 2018-02-17 ENCOUNTER — Encounter (HOSPITAL_COMMUNITY): Payer: Self-pay | Admitting: Internal Medicine

## 2018-02-17 VITALS — BP 120/70 | HR 60 | Wt 126.8 lb

## 2018-02-17 DIAGNOSIS — Z170 Estrogen receptor positive status [ER+]: Secondary | ICD-10-CM | POA: Diagnosis not present

## 2018-02-17 DIAGNOSIS — I25118 Atherosclerotic heart disease of native coronary artery with other forms of angina pectoris: Secondary | ICD-10-CM | POA: Diagnosis not present

## 2018-02-17 DIAGNOSIS — Z794 Long term (current) use of insulin: Secondary | ICD-10-CM | POA: Diagnosis not present

## 2018-02-17 DIAGNOSIS — I2510 Atherosclerotic heart disease of native coronary artery without angina pectoris: Secondary | ICD-10-CM | POA: Diagnosis not present

## 2018-02-17 DIAGNOSIS — M19049 Primary osteoarthritis, unspecified hand: Secondary | ICD-10-CM | POA: Insufficient documentation

## 2018-02-17 DIAGNOSIS — E119 Type 2 diabetes mellitus without complications: Secondary | ICD-10-CM | POA: Insufficient documentation

## 2018-02-17 DIAGNOSIS — I110 Hypertensive heart disease with heart failure: Secondary | ICD-10-CM | POA: Diagnosis not present

## 2018-02-17 DIAGNOSIS — Z9011 Acquired absence of right breast and nipple: Secondary | ICD-10-CM | POA: Insufficient documentation

## 2018-02-17 DIAGNOSIS — Z79899 Other long term (current) drug therapy: Secondary | ICD-10-CM | POA: Insufficient documentation

## 2018-02-17 DIAGNOSIS — K219 Gastro-esophageal reflux disease without esophagitis: Secondary | ICD-10-CM | POA: Diagnosis not present

## 2018-02-17 DIAGNOSIS — C50811 Malignant neoplasm of overlapping sites of right female breast: Secondary | ICD-10-CM

## 2018-02-17 DIAGNOSIS — I351 Nonrheumatic aortic (valve) insufficiency: Secondary | ICD-10-CM | POA: Diagnosis not present

## 2018-02-17 DIAGNOSIS — J45909 Unspecified asthma, uncomplicated: Secondary | ICD-10-CM | POA: Insufficient documentation

## 2018-02-17 DIAGNOSIS — E785 Hyperlipidemia, unspecified: Secondary | ICD-10-CM | POA: Insufficient documentation

## 2018-02-17 DIAGNOSIS — Z7982 Long term (current) use of aspirin: Secondary | ICD-10-CM | POA: Insufficient documentation

## 2018-02-17 DIAGNOSIS — I252 Old myocardial infarction: Secondary | ICD-10-CM | POA: Insufficient documentation

## 2018-02-17 DIAGNOSIS — C50411 Malignant neoplasm of upper-outer quadrant of right female breast: Secondary | ICD-10-CM | POA: Insufficient documentation

## 2018-02-17 NOTE — Progress Notes (Signed)
Cardio-Oncology Clinic Consult Note   Referring Physician: Dr. Lindi Adie  Primary Cardiologist: Dr. Terrence Dupont  HPI:  [name] is a 68 y.o. female with past medical history of R breast cancer, CAD s/p RCA and OM stent followed by Dr. Dominica Severin who has been referred by Dr. Lindi Adie to establish in the cardio-oncology clinic for monitoring of cardio-toxicity while undergoing chemotherapy for her R breast CA..  Malignant neoplasm of upper-outer quadrant of right breast in female, estrogen receptor positive ([place])   09/03/2017 Initial Diagnosis    Screening mammogram detected 1.4 cm right breast mass at 10 o'clock position, additional pleomorphic calcifications 2 cm posteriorly, additional loosely grouped calcifications 10.1 x 7.1 x 5.3 cm, single right axillary lymph node: Biopsy revealed DCIS ER 100%, PR 50%; IDC grade 2-3 ER 100%, PR 50%, Ki-67 15%, HER-2 positive ratio 2, copy #5, lymph node negative T2 N0 stage Ia clinical stage AJCC 8    11/27/2017 Surgery    Right mastectomy: IDC grade 2, 1.6 cm, separate foci of DCIS intermediate grade, margins negative, 0/5 lymph nodes negative,ER 100%, PR 50%, Ki-67 15%, HER-2 positive ratio 2, copy #5 T1c N0 stage Ia   Treatment Plan  Herceptin q 3 weeks x 1 year. To finish 01/2019 if stays on schedule.   She presents today to establish in the cardio-oncology clinic while on Herceptin.  She is a poor historian. She is feeling OK overall. She had to stop Taxol due to intolerance. She states it made her feel bad and feel numb in her legs and arms.  She has a h/o CAD and a previous stent and is followed by Dr. Terrence Dupont.  She seems to have frequent CP  that "comes and goes", and occasionally radiates into her left or right side. She has been seen in the ED before for same, with negative troponins. Denies any edema, orthopnea or PND.   Echo today shows LVEF 55-60%, mild AI, Grade 1 DD, -16.4  Echo 11/26/17 LVEF 50-55%, Grade 1 DD, Mild/mod AI, GLS -  14.55  Stress test 04/20/2016 (Most recent) 1. No reversible ischemia or infarction. 2. Normal left ventricular wall motion. 3. Left ventricular ejection fraction 54% 4. Non invasive risk stratification*: Low  LHC 02/20/2011 - LV showed mild global hypokinesia, EF of approximately 50%. - Left main was patent. LAD has 10-15% proximal stenosis.  - Diagonal 1 has 20% ostial stenosis. Diagonal 2 is very small.   - Left circumflex has 40-50% proximal sequential stenosis. Stented midportion is patent.   - OM 1 and OM 2 were very, very small.  OM 3 and 4 were small which were patent.   - RCA was patent at prior PTCA stenting site.  Aortography showed no aortic aneurysm.   - Right renal artery has 60-70% ostial stenosis. Left renal artery is patent.   Review of Systems: [y] = yes, '[ ]'$  = no   . General: Weight gain '[ ]'$ ; Weight loss '[ ]'$ ; Anorexia '[ ]'$ ; Fatigue [y]; Fever '[ ]'$ ; Chills '[ ]'$ ; Weakness '[ ]'$   . Cardiac: Chest pain/pressure [y]; Resting SOB '[ ]'$ ; Exertional SOB '[ ]'$ ; Orthopnea '[ ]'$ ; Pedal Edema '[ ]'$ ; Palpitations '[ ]'$ ; Syncope '[ ]'$ ; Presyncope '[ ]'$ ; Paroxysmal nocturnal dyspnea'[ ]'$   . Pulmonary: Cough '[ ]'$ ; Wheezing'[ ]'$ ; Hemoptysis'[ ]'$ ; Sputum '[ ]'$ ; Snoring '[ ]'$   . GI: Vomiting'[ ]'$ ; Dysphagia'[ ]'$ ; Melena'[ ]'$ ; Hematochezia '[ ]'$ ; Heartburn'[ ]'$ ; Abdominal pain '[ ]'$ ; Constipation '[ ]'$ ; Diarrhea '[ ]'$ ; BRBPR '[ ]'$   .  GU: Hematuria[ ] ; Dysuria [ ] ; Nocturia[ ]   . Vascular: Pain in legs with walking [ ] ; Pain in feet with lying flat [ ] ; Non-healing sores [ ] ; Stroke [ ] ; TIA [ ] ; Slurred speech [ ] ;  . Neuro: Headaches[ ] ; Vertigo[ ] ; Seizures[ ] ; Paresthesias[ ] ;Blurred vision [ ] ; Diplopia [ ] ; Vision changes [ ]   . Ortho/Skin: Arthritis [y]; Joint pain [y]; Muscle pain [ ] ; Joint swelling [ ] ; Back Pain [ ] ; Rash [ ]   . Psych: Depression[ ] ; Anxiety[ ]   . Heme: Bleeding problems [ ] ; Clotting disorders [ ] ; Anemia [ ]   . Endocrine: Diabetes [ ] ; Thyroid dysfunction[ ]   Past Medical History:  Diagnosis Date  .  Angina   . Arthritis    HANDS"  . Asthma   . Cancer ([place]) 09/04/2017   Right Breast  . Carpal tunnel syndrome, bilateral 11/26/2016  . Cholelithiasis   . Cocaine abuse ([place]) 07/2012   per E.R. drug screen  . Coronary artery disease   . Diabetes mellitus without mention of complication    type 2  . Dysphagia    esophageal dysmotility on 03/2011 esophagram   . Fatty liver disease, nonalcoholic 4825   on Imaging.   Marland Kitchen GERD (gastroesophageal reflux disease)   . History of colonic polyps 2009   adenomatous 2009, HP in 2009, 2011, 2013.   Marland Kitchen Hyperlipidemia   . Hypertension   . Iron deficiency anemia   . Myocardial infarction ([place])    years ago, 6 stents placed  . Ulnar neuropathy at elbow, 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   Current Outpatient Medications  Medication Sig Dispense Refill  . albuterol (PROVENTIL) (2.5 MG/3ML) 0.083% nebulizer solution Take 2.5 mg by nebulization every 6 (six) hours as needed for wheezing or shortness of breath.     . ALPRAZolam (XANAX) 0.25 MG tablet Take 0.25 mg by mouth 2 (two) times daily.     Marland Kitchen amLODipine (NORVASC) 5 MG tablet Take 5 mg by mouth daily.    Marland Kitchen aspirin EC 81 MG EC tablet Take 1 tablet (81 mg total) by mouth daily. 30 tablet 0  . atorvastatin (LIPITOR) 20 MG tablet Take 20 mg by mouth daily.    . budesonide-formoterol (SYMBICORT) 160-4.5 MCG/ACT inhaler Inhale 1 puff into the lungs 2 (two) times daily.     . cephALEXin (KEFLEX) 500 MG capsule Take 500 mg by mouth 3 (three) times daily.    . cyclobenzaprine (FLEXERIL) 10 MG tablet Take 1 tablet (10 mg total) by mouth 2 (two) times daily as needed for muscle spasms. 20 tablet 0  . feeding supplement, ENSURE ENLIVE, (ENSURE ENLIVE) LIQD Take 237 mLs by mouth 2 (two) times daily between meals. 60 Bottle 0  . HYDROcodone-acetaminophen (NORCO) 5-325 MG tablet 1-2 tabs po q6 hours prn pain 20 tablet 0  . Insulin Glargine (LANTUS SOLOSTAR) 100 UNIT/ML Solostar Pen Inject 20 Units into the skin daily at 10 pm.  (Patient taking differently: Inject 40 Units into the skin 2 (two) times daily. ) 15 mL 0  . lidocaine-prilocaine (EMLA) cream Apply to affected area once 30 g 3  . loperamide (IMODIUM) 2 MG capsule Take 1 capsule (2 mg total) by mouth 4 (four) times daily as needed for diarrhea or loose stools. 12 capsule 0  . metFORMIN (GLUCOPHAGE) 1000 MG tablet Take 500 mg by mouth 2 (two) times daily with a meal.     . metoprolol succinate (TOPROL-XL) 50 MG 24 hr tablet Take 50 mg by  mouth at bedtime. Take with or immediately following a meal.    . nitroGLYCERIN (NITROSTAT) 0.4 MG SL tablet Place 1 tablet (0.4 mg total) under the tongue every 5 (five) minutes as needed. For chest pain (Patient taking differently: Place 0.4 mg under the tongue every 5 (five) minutes as needed for chest pain. ) 10 tablet 0  . Omega-3 Fatty Acids (FISH OIL) 1000 MG CAPS Take 1,000 mg by mouth daily.    . ondansetron (ZOFRAN) 8 MG tablet Take 1 tablet (8 mg total) by mouth every 8 (eight) hours as needed for nausea or vomiting. 20 tablet 0  . oxyCODONE (ROXICODONE) 5 MG immediate release tablet Take 1 tablet (5 mg total) by mouth every 6 (six) hours as needed for severe pain. 12 tablet 0  . pantoprazole (PROTONIX) 40 MG tablet Take 1 tablet (40 mg total) by mouth daily. 60 tablet 0   No current facility-administered medications for this encounter.    No Known Allergies  Social History   Socioeconomic History  . Marital status: Divorced    Spouse name: Not on file  . Number of children: 2  . Years of education: Not on file  . Highest education level: Not on file  Occupational History  . Occupation: retired. prev worked in housekeeping w/ [employer]  . Financial resource strain: Not on file  . Food insecurity:    Worry: Not on file    Inability: Not on file  . Transportation needs:    Medical: Yes    Non-medical: Yes  Tobacco Use  . Smoking status: Former Smoker    Packs/day: 0.50    Years: 47.00    Pack  years: 23.50    Types: Cigarettes    Last attempt to quit: 05/09/2010    Years since quitting: 7.7  . Smokeless tobacco: Never Used  Substance and Sexual Activity  . Alcohol use: No  . Drug use: Not Currently    Types: Cocaine    Comment: positive drug screen (cocaine, benzo's)07/2012 in emergency room  . Sexual activity: Not Currently    Birth control/protection: Post-menopausal  Lifestyle  . Physical activity:    Days per week: Not on file    Minutes per session: Not on file  . Stress: Not on file  Relationships  . Social connections:    Talks on phone: Not on file    Gets together: Not on file    Attends religious service: Not on file    Active member of club or organization: Not on file    Attends meetings of clubs or organizations: Not on file    Relationship status: Not on file  . Intimate partner violence:    Fear of current or ex partner: Not on file    Emotionally abused: Not on file    Physically abused: Not on file    Forced sexual activity: Not on file  Other Topics Concern  . Not on file  Social History Narrative   Caffeine use daily.    Family History  Problem Relation Age of Onset  . Heart disease Mother   . Diabetes Mother   . Cancer Father        unsure what kind  . Diabetes Sister   . Colon cancer Neg Hx   . Breast cancer Neg Hx    Vitals:   02/17/18 1224  BP: 120/70  Pulse: 60  SpO2: 98%  Weight: 57.5 kg (126 lb 12.8 oz)   Wt  Readings from Last 3 Encounters:  02/17/18 57.5 kg (126 lb 12.8 oz)  12/18/17 58.1 kg (128 lb)  12/05/17 59.8 kg (131 lb 14.4 oz)    PHYSICAL EXAM: General:  Elderly appearing. NAD HEENT: normal Neck: supple. no JVD. Carotids 2+ bilat; no bruits. No lymphadenopathy or thyromegaly appreciated. Cor: PMI nondisplaced. Regular rate & rhythm. Soft AI murmur. Left port-a-cath Lungs: clear Abdomen: soft, nontender, nondistended. No hepatosplenomegaly. No bruits or masses. Good bowel sounds. Extremities: no cyanosis,  clubbing, rash, or edema Neuro: alert & oriented x 3, cranial nerves grossly intact. moves all 4 extremities w/o difficulty. Affect flat but appropriate.  ASSESSMENT & PLAN:  1. Malignant neoplasm of upper-outer quadrant of right breast in female, estrogen receptor positive ([place]) - She is on Herceptin. Did not tolerate Taxol due to malaise and paraesthesias per patient report.  - Echo today shows LVEF 55-60%, mild AI, Grade 1 DD, -16.4. Continue Herceptin with follow up Echo in 3 months.  - She states she will finish in December if she keeps her schedule, but has had trouble with transportation. She is working on with her family.  - Explained incidence of Herceptin cardiotoxicity and role of Cardio-oncology clinic at length. Echo images reviewed personally. All parameters stable. Reviewed signs and symptoms of HF to look for. Ok to begin chemotherapy. Follow-up with echo in 3 months.  2. CAD - Atypical chest pain. Took 3 NTG with relief 02/15/2018. CP not related to activity.  - LHC 02/2011 with patent stents - Most recent ischemic work up 04/2016 Myoview with no reversible ischemia or infarction.  - follows with Dr. Ok Edwards, PA-C  02/17/18   Patient seen and examined with the above-signed Advanced Practice Provider and/or Housestaff. I personally reviewed laboratory data, imaging studies and relevant notes. I independently examined the patient and formulated the important aspects of the plan. I have edited the note to reflect any of my changes or salient points. I have personally discussed the plan with the patient and/or family.  68 y/o woman with CAD and mild AI followed by Dr. Terrence Dupont. Recently diagnosed with R breast cancer. Currently receiving Herceptin and tolerating well. I reviewed echos personally. EF and Doppler parameters stable. No HF on exam. Discussed risk of Herceptin toxicity. Will continue Herceptin. Repeat echo in 3 months.   Glori Bickers, MD    9:13 PM

## 2018-02-17 NOTE — Patient Instructions (Signed)
Your physician recommends that you schedule a follow-up appointment in: 3 months with Dr. Haroldine Laws and an ECHO.  Please call IN February to schedule your appointments.   Your physician has requested that you have an echocardiogram. Echocardiography is a painless test that uses sound waves to create images of your heart. It provides your doctor with information about the size and shape of your heart and how well your heart's chambers and valves are working. This procedure takes approximately one hour. There are no restrictions for this procedure.   PLEASE call Dr. Corky Crafts in change in chest pain.

## 2018-02-17 NOTE — Progress Notes (Signed)
  Echocardiogram 2D Echocardiogram has been performed.  Darlina Sicilian M 02/17/2018, 11:52 AM

## 2018-02-18 ENCOUNTER — Other Ambulatory Visit: Payer: Self-pay | Admitting: Internal Medicine

## 2018-02-18 DIAGNOSIS — Z13820 Encounter for screening for osteoporosis: Secondary | ICD-10-CM

## 2018-02-21 ENCOUNTER — Other Ambulatory Visit: Payer: Self-pay

## 2018-02-21 ENCOUNTER — Ambulatory Visit: Payer: Self-pay

## 2018-02-21 ENCOUNTER — Telehealth: Payer: Self-pay | Admitting: *Deleted

## 2018-02-21 ENCOUNTER — Ambulatory Visit: Payer: Self-pay | Admitting: Hematology and Oncology

## 2018-02-21 NOTE — Telephone Encounter (Signed)
"  Charlyne Petrin Clinical Director for [facility] [phone]).  Need to speak with someone about an active member [name] who was to be picked up.  She called expressing being very upset."

## 2018-02-26 ENCOUNTER — Other Ambulatory Visit: Payer: Self-pay

## 2018-02-26 DIAGNOSIS — C50411 Malignant neoplasm of upper-outer quadrant of right female breast: Secondary | ICD-10-CM

## 2018-02-26 DIAGNOSIS — Z170 Estrogen receptor positive status [ER+]: Principal | ICD-10-CM

## 2018-02-26 NOTE — Progress Notes (Signed)
Patient Care Team: Tsosie Billing, MD as PCP - General (Internal Medicine)  DIAGNOSIS:    ICD-10-CM   1. Malignant neoplasm of upper-outer quadrant of right breast in female, estrogen receptor positive ([place]) C50.411    Z17.0     SUMMARY OF ONCOLOGIC HISTORY:   Malignant neoplasm of upper-outer quadrant of right breast in female, estrogen receptor positive ([place])   09/03/2017 Initial Diagnosis    Screening mammogram detected 1.4 cm right breast mass at 10 o'clock position, additional pleomorphic calcifications 2 cm posteriorly, additional loosely grouped calcifications 10.1 x 7.1 x 5.3 cm, single right axillary lymph node: Biopsy revealed DCIS ER 100%, PR 50%; IDC grade 2-3 ER 100%, PR 50%, Ki-67 15%, HER-2 positive ratio 2, copy #5, lymph node negative T2 N0 stage Ia clinical stage AJCC 8    11/27/2017 Surgery    Right mastectomy: IDC grade 2, 1.6 cm, separate foci of DCIS intermediate grade, margins negative, 0/5 lymph nodes negative,ER 100%, PR 50%, Ki-67 15%, HER-2 positive ratio 2, copy #5 T1c N0 stage Ia    02/07/2018 -  Chemotherapy    The patient had trastuzumab (HERCEPTIN) 483 mg in sodium chloride 0.9 % 250 mL chemo infusion, 8 mg/kg = 483 mg, Intravenous,  Once, 1 of 18 cycles Administration: 483 mg (02/07/2018)  for chemotherapy treatment.     02/27/2018 -  Anti-estrogen oral therapy    Anastrozole 48m daily     CHIEF COMPLIANT: Follow-up of Herceptin treatment  INTERVAL HISTORY: Dominique CABANAis a 68y.o. with above-mentioned history of right breast cancer treated with mastectomy and is currently on treatment with Herceptin every 3 weeks and anastrozole. An ECHO from 02/17/18 showed an ejection fracture in the range of 55-60%. Her first Herceptin treatment was 02/07/18 and she presents to the clinic today for her second treatment. She reports her last treatment went well. There is tension in her family because they want to put her in a nursing home but she does not  want that. She reviewed her medication list with me.   REVIEW OF SYSTEMS:   Constitutional: Denies fevers, chills or abnormal weight loss Eyes: Denies blurriness of vision Ears, nose, mouth, throat, and face: Denies mucositis or sore throat Respiratory: Denies cough, dyspnea or wheezes Cardiovascular: Denies palpitation, chest discomfort Gastrointestinal:  Denies nausea, heartburn or change in bowel habits Skin: Denies abnormal skin rashes Lymphatics: Denies new lymphadenopathy or easy bruising Neurological: Denies numbness, tingling or new weaknesses Behavioral/Psych: Mood is stable, no new changes  Extremities: No lower extremity edema Breast: denies any pain or lumps or nodules in either breasts All other systems were reviewed with the patient and are negative.  I have reviewed the past medical history, past surgical history, social history and family history with the patient and they are unchanged from previous note.  ALLERGIES:  has No Known Allergies.  MEDICATIONS:  Current Outpatient Medications  Medication Sig Dispense Refill  . albuterol (PROVENTIL) (2.5 MG/3ML) 0.083% nebulizer solution Take 2.5 mg by nebulization every 6 (six) hours as needed for wheezing or shortness of breath.     . ALPRAZolam (XANAX) 0.25 MG tablet Take 0.25 mg by mouth 2 (two) times daily.     .Marland KitchenamLODipine (NORVASC) 5 MG tablet Take 5 mg by mouth daily.    .Marland Kitchenanastrozole (ARIMIDEX) 1 MG tablet Take 1 tablet (1 mg total) by mouth daily. 90 tablet 3  . aspirin EC 81 MG EC tablet Take 1 tablet (81 mg total) by  mouth daily. 30 tablet 0  . atorvastatin (LIPITOR) 20 MG tablet Take 20 mg by mouth daily.    . budesonide-formoterol (SYMBICORT) 160-4.5 MCG/ACT inhaler Inhale 1 puff into the lungs 2 (two) times daily.     . feeding supplement, ENSURE ENLIVE, (ENSURE ENLIVE) LIQD Take 237 mLs by mouth 2 (two) times daily between meals. 60 Bottle 0  . Insulin Glargine (LANTUS SOLOSTAR) 100 UNIT/ML Solostar Pen Inject  20 Units into the skin daily at 10 pm. (Patient taking differently: Inject 40 Units into the skin 2 (two) times daily. ) 15 mL 0  . lidocaine-prilocaine (EMLA) cream Apply to affected area once 30 g 3  . loperamide (IMODIUM) 2 MG capsule Take 1 capsule (2 mg total) by mouth 4 (four) times daily as needed for diarrhea or loose stools. 12 capsule 0  . metFORMIN (GLUCOPHAGE) 1000 MG tablet Take 500 mg by mouth 2 (two) times daily with a meal.     . metoprolol succinate (TOPROL-XL) 50 MG 24 hr tablet Take 50 mg by mouth at bedtime. Take with or immediately following a meal.    . nitroGLYCERIN (NITROSTAT) 0.4 MG SL tablet Place 1 tablet (0.4 mg total) under the tongue every 5 (five) minutes as needed. For chest pain (Patient taking differently: Place 0.4 mg under the tongue every 5 (five) minutes as needed for chest pain. ) 10 tablet 0  . Omega-3 Fatty Acids (FISH OIL) 1000 MG CAPS Take 1,000 mg by mouth daily.    . ondansetron (ZOFRAN) 8 MG tablet Take 1 tablet (8 mg total) by mouth every 8 (eight) hours as needed for nausea or vomiting. 20 tablet 0  . pantoprazole (PROTONIX) 40 MG tablet Take 1 tablet (40 mg total) by mouth daily. 60 tablet 0   No current facility-administered medications for this visit.    Facility-Administered Medications Ordered in Other Visits  Medication Dose Route Frequency Provider Last Rate Last Dose  . sodium chloride flush (NS) 0.9 % injection 10 mL  10 mL Intracatheter PRN Nicholas Lose, MD   10 mL at 02/27/18 0915    PHYSICAL EXAMINATION: ECOG PERFORMANCE STATUS: 1 - Symptomatic but completely ambulatory  Vitals:   02/27/18 0940  BP: 135/75  Pulse: 71  Resp: 16  Temp: 98.8 F (37.1 C)  SpO2: 100%   Filed Weights   02/27/18 0940  Weight: 126 lb 6.4 oz (57.3 kg)    GENERAL: alert, no distress and comfortable SKIN: skin color, texture, turgor are normal, no rashes or significant lesions EYES: normal, Conjunctiva are pink and non-injected, sclera  clear OROPHARYNX: no exudate, no erythema and lips, buccal mucosa, and tongue normal  NECK: supple, thyroid normal size, non-tender, without nodularity LYMPH: no palpable lymphadenopathy in the cervical, axillary or inguinal LUNGS: clear to auscultation and percussion with normal breathing effort HEART: regular rate & rhythm and no murmurs and no lower extremity edema ABDOMEN: abdomen soft, non-tender and normal bowel sounds MUSCULOSKELETAL: no cyanosis of digits and no clubbing  NEURO: alert & oriented x 3 with fluent speech, no focal motor/sensory deficits EXTREMITIES: No lower extremity edema    LABORATORY DATA:  I have reviewed the data as listed CMP Latest Ref Rng & Units 02/27/2018 01/19/2018 12/18/2017  Glucose 70 - 99 mg/dL 154(H) 98 75  BUN 8 - 23 mg/dL 16 14 20   Creatinine 0.44 - 1.00 mg/dL 1.69(H) 1.55(H) 1.72(H)  Sodium 135 - 145 mmol/L 140 140 137  Potassium 3.5 - 5.1 mmol/L 4.3 3.8 4.5  Chloride 98 - 111 mmol/L 106 104 105  CO2 22 - 32 mmol/L 26 24 23   Calcium 8.9 - 10.3 mg/dL 9.3 9.7 9.8  Total Protein 6.5 - 8.1 g/dL 8.3(H) - 8.1  Total Bilirubin 0.3 - 1.2 mg/dL 0.5 - 0.6  Alkaline Phos 38 - 126 U/L 133(H) - 121  AST 15 - 41 U/L 11(L) - 19  ALT 0 - 44 U/L 8 - 15    Lab Results  Component Value Date   WBC 6.6 02/27/2018   HGB 7.8 (L) 02/27/2018   HCT 27.4 (L) 02/27/2018   MCV 79.7 (L) 02/27/2018   PLT 365 02/27/2018   NEUTROABS 3.7 02/27/2018    ASSESSMENT & PLAN:  Malignant neoplasm of upper-outer quadrant of right breast in female, estrogen receptor positive ([place]) 11/27/2017 right mastectomy: IDC grade 2, 1.6 cm, separate foci of DCIS intermediate grade, margins negative, 0/5 lymph nodes negative,ER 100%, PR 50%, Ki-67 15%, HER-2 positive ratio 2, copy #5 T1c N0 stage Ia  Treatment plan: 1.  Adjuvant Herceptin every 3 weeks for 1 year (because of poor performance status Taxol is felt to be intolerable for her) 2.  Adjuvant antiestrogen therapy with  letrozole 2.5 mg daily  Patient's daughter is the primary point of contact for her and all appointment changes will need to go through her. -------------------------------------------------------------------------------------- Current treatment: Herceptin with anastrozole started 02/07/2018 Adverse effects: No side effects to Herceptin I sent a prescription for anastrozole today. I discussed with her the adverse effects of anastrozole therapy including hot flashes arthralgias myalgias as well as osteoporosis risk. Patient tells me that she is going to get a bone density with her primary care physician.  We will review those results.  She will take calcium and vitamin D.  She does not need labs every time she gets Herceptin. We will be seeing her every 6 weeks with labs.  Herceptin is every 3 weeks.  Return to clinic every 3 weeks for Herceptin and every 6 weeks of follow-up with me.     No orders of the defined types were placed in this encounter.  The patient has a good understanding of the overall plan. she agrees with it. she will call with any problems that may develop before the next visit here.  Nicholas Lose, MD 02/27/2018  Julious Oka Dorshimer am acting as scribe for Dr. Nicholas Lose.  I have reviewed the above documentation for accuracy and completeness, and I agree with the above.

## 2018-02-27 ENCOUNTER — Inpatient Hospital Stay: Payer: Medicare Other

## 2018-02-27 ENCOUNTER — Ambulatory Visit: Payer: Self-pay

## 2018-02-27 ENCOUNTER — Ambulatory Visit (HOSPITAL_COMMUNITY)
Admission: RE | Admit: 2018-02-27 | Discharge: 2018-02-27 | Disposition: A | Discharge status: Home / Self Care | Payer: Medicare Other | Source: Ambulatory Visit | Attending: Hematology and Oncology | Admitting: Hematology and Oncology

## 2018-02-27 ENCOUNTER — Inpatient Hospital Stay (HOSPITAL_BASED_OUTPATIENT_CLINIC_OR_DEPARTMENT_OTHER): Payer: Medicare Other | Admitting: Hematology and Oncology

## 2018-02-27 ENCOUNTER — Other Ambulatory Visit: Payer: Self-pay

## 2018-02-27 DIAGNOSIS — C50411 Malignant neoplasm of upper-outer quadrant of right female breast: Secondary | ICD-10-CM | POA: Diagnosis present

## 2018-02-27 DIAGNOSIS — Z79811 Long term (current) use of aromatase inhibitors: Secondary | ICD-10-CM | POA: Diagnosis not present

## 2018-02-27 DIAGNOSIS — Z95828 Presence of other vascular implants and grafts: Secondary | ICD-10-CM | POA: Insufficient documentation

## 2018-02-27 DIAGNOSIS — Z170 Estrogen receptor positive status [ER+]: Principal | ICD-10-CM

## 2018-02-27 DIAGNOSIS — Z5112 Encounter for antineoplastic immunotherapy: Secondary | ICD-10-CM | POA: Diagnosis not present

## 2018-02-27 DIAGNOSIS — Z9012 Acquired absence of left breast and nipple: Secondary | ICD-10-CM | POA: Diagnosis not present

## 2018-02-27 LAB — CMP (CANCER CENTER ONLY)
ALT: 8 U/L (ref 0–44)
AST: 11 U/L — ABNORMAL LOW (ref 15–41)
Albumin: 3.5 g/dL (ref 3.5–5.0)
Alkaline Phosphatase: 133 U/L — ABNORMAL HIGH (ref 38–126)
Anion gap: 8 (ref 5–15)
BUN: 16 mg/dL (ref 8–23)
CO2: 26 mmol/L (ref 22–32)
CREATININE: 1.69 mg/dL — AB (ref 0.44–1.00)
Calcium: 9.3 mg/dL (ref 8.9–10.3)
Chloride: 106 mmol/L (ref 98–111)
GFR, Est AFR Am: 35 mL/min — ABNORMAL LOW (ref >60)
GFR, Estimated: 30 mL/min — ABNORMAL LOW (ref >60)
Glucose, Bld: 154 mg/dL — ABNORMAL HIGH (ref 70–99)
Potassium: 4.3 mmol/L (ref 3.5–5.1)
Sodium: 140 mmol/L (ref 135–145)
Total Bilirubin: 0.5 mg/dL (ref 0.3–1.2)
Total Protein: 8.3 g/dL — ABNORMAL HIGH (ref 6.5–8.1)

## 2018-02-27 LAB — CBC WITH DIFFERENTIAL (CANCER CENTER ONLY)
Abs Immature Granulocytes: 0.02 10*3/uL (ref 0.00–0.07)
Basophils Absolute: 0 10*3/uL (ref 0.0–0.1)
Basophils Relative: 1 %
Eosinophils Absolute: 0.3 10*3/uL (ref 0.0–0.5)
Eosinophils Relative: 4 %
HEMATOCRIT: 27.4 % — AB (ref 36.0–46.0)
HEMOGLOBIN: 7.8 g/dL — AB (ref 12.0–15.0)
Immature Granulocytes: 0 %
LYMPHS PCT: 29 %
Lymphs Abs: 1.9 10*3/uL (ref 0.7–4.0)
MCH: 22.7 pg — ABNORMAL LOW (ref 26.0–34.0)
MCHC: 28.5 g/dL — ABNORMAL LOW (ref 30.0–36.0)
MCV: 79.7 fL — ABNORMAL LOW (ref 80.0–100.0)
Monocytes Absolute: 0.6 10*3/uL (ref 0.1–1.0)
Monocytes Relative: 9 %
Neutro Abs: 3.7 10*3/uL (ref 1.7–7.7)
Neutrophils Relative %: 57 %
Platelet Count: 365 10*3/uL (ref 150–400)
RBC: 3.44 MIL/uL — ABNORMAL LOW (ref 3.87–5.11)
RDW: 16 % — ABNORMAL HIGH (ref 11.5–15.5)
WBC Count: 6.6 10*3/uL (ref 4.0–10.5)
nRBC: 0 % (ref 0.0–0.2)

## 2018-02-27 MED ORDER — SODIUM CHLORIDE 0.9% FLUSH
10.0000 mL | INTRAVENOUS | Status: DC | PRN
  Administered 2018-02-27: 10 mL
  Filled 2018-02-27: qty 10

## 2018-02-27 MED ORDER — HEPARIN SOD (PORK) LOCK FLUSH 100 UNIT/ML IV SOLN
500.0000 [IU] | Freq: Once | INTRAVENOUS | Status: AC | PRN
  Administered 2018-02-27: 500 [IU]
  Filled 2018-02-27: qty 5

## 2018-02-27 MED ORDER — DIPHENHYDRAMINE HCL 25 MG PO CAPS
50.0000 mg | ORAL_CAPSULE | Freq: Once | ORAL | Status: AC
  Administered 2018-02-27: 50 mg via ORAL

## 2018-02-27 MED ORDER — ANASTROZOLE 1 MG PO TABS: 1.0000 mg | ORAL_TABLET | Freq: Every day | ORAL | 3 refills | Status: DC

## 2018-02-27 MED ORDER — TRASTUZUMAB CHEMO 150 MG IV SOLR
6.0000 mg/kg | Freq: Once | INTRAVENOUS | Status: AC
  Administered 2018-02-27: 357 mg via INTRAVENOUS
  Filled 2018-02-27: qty 17

## 2018-02-27 MED ORDER — ACETAMINOPHEN 325 MG PO TABS
ORAL_TABLET | ORAL | Status: AC
  Filled 2018-02-27: qty 2

## 2018-02-27 MED ORDER — SODIUM CHLORIDE 0.9 % IV SOLN
Freq: Once | INTRAVENOUS | Status: AC
  Administered 2018-02-27: 10:00:00 via INTRAVENOUS
  Filled 2018-02-27: qty 250

## 2018-02-27 MED ORDER — DIPHENHYDRAMINE HCL 25 MG PO CAPS
ORAL_CAPSULE | ORAL | Status: AC
  Filled 2018-02-27: qty 2

## 2018-02-27 MED ORDER — ACETAMINOPHEN 325 MG PO TABS
650.0000 mg | ORAL_TABLET | Freq: Once | ORAL | Status: AC
  Administered 2018-02-27: 650 mg via ORAL

## 2018-02-27 NOTE — Patient Instructions (Signed)
[facility] Discharge Instructions for Patients Receiving Chemotherapy  Today you received the following chemotherapy agents: Trastuzumab (Herceptin)  To help prevent nausea and vomiting after your treatment, we encourage you to take your nausea medication as directed.    If you develop nausea and vomiting that is not controlled by your nausea medication, call the clinic.   BELOW ARE SYMPTOMS THAT SHOULD BE REPORTED IMMEDIATELY:  *FEVER GREATER THAN 100.5 F  *CHILLS WITH OR WITHOUT FEVER  NAUSEA AND VOMITING THAT IS NOT CONTROLLED WITH YOUR NAUSEA MEDICATION  *UNUSUAL SHORTNESS OF BREATH  *UNUSUAL BRUISING OR BLEEDING  TENDERNESS IN MOUTH AND THROAT WITH OR WITHOUT PRESENCE OF ULCERS  *URINARY PROBLEMS  *BOWEL PROBLEMS  UNUSUAL RASH Items with * indicate a potential emergency and should be followed up as soon as possible.  Feel free to call the clinic should you have any questions or concerns. The clinic phone number is [phone].  Please show the CHEMO ALERT CARD at check-in to the Emergency Department and triage nurse.    

## 2018-02-27 NOTE — Assessment & Plan Note (Signed)
11/27/2017 right mastectomy: IDC grade 2, 1.6 cm, separate foci of DCIS intermediate grade, margins negative, 0/5 lymph nodes negative,ER 100%, PR 50%, Ki-67 15%, HER-2 positive ratio 2, copy #5 T1c N0 stage Ia  Treatment plan: 1.  Adjuvant Herceptin every 3 weeks for 1 year (because of poor performance status Taxol is felt to be intolerable for her) 2.  Adjuvant antiestrogen therapy with letrozole 2.5 mg daily  Patient's daughter is the primary point of contact for her and all appointment changes will need to go through her. -------------------------------------------------------------------------------------- Current treatment: Herceptin with anastrozole started 02/07/2018 Adverse effects:  Return to clinic every 3 weeks for Herceptin and every 6 weeks of follow-up with me.

## 2018-02-27 NOTE — Progress Notes (Signed)
Per infusion RN, needed clarification on tip placement of port. Noted last xray 12/18/17 had a change in tip placement reading, that states "left-sided Port-A-Cath with the tip projecting over the confluence left subclavian vein". Per Dr.Gudena, start PIV for today and send pt for xray to check tip placement verification, after treatment.   Called and spoke with [facility] imaging radiologist to clarify last reading 02/17/17. Radiologist states that "tip is within the L innominate vein and approximately about 1cm from SVC." Notified Dr.Gudena and is aware.Infusion RN aware as well and will start a PIV and send pt to Xray when finished with infusion. Pt notified agreeable to plan.

## 2018-03-03 ENCOUNTER — Emergency Department (HOSPITAL_COMMUNITY): Payer: Medicare Other

## 2018-03-03 ENCOUNTER — Other Ambulatory Visit: Payer: Self-pay

## 2018-03-03 ENCOUNTER — Emergency Department (HOSPITAL_COMMUNITY)
Admission: EM | Admit: 2018-03-03 | Discharge: 2018-03-03 | Disposition: A | Discharge status: Home / Self Care | Payer: Medicare Other | Attending: Emergency Medicine | Admitting: Emergency Medicine

## 2018-03-03 ENCOUNTER — Encounter (HOSPITAL_COMMUNITY): Payer: Self-pay | Admitting: Emergency Medicine

## 2018-03-03 DIAGNOSIS — E119 Type 2 diabetes mellitus without complications: Secondary | ICD-10-CM | POA: Diagnosis not present

## 2018-03-03 DIAGNOSIS — Z7982 Long term (current) use of aspirin: Secondary | ICD-10-CM | POA: Diagnosis not present

## 2018-03-03 DIAGNOSIS — R0789 Other chest pain: Secondary | ICD-10-CM | POA: Insufficient documentation

## 2018-03-03 DIAGNOSIS — D649 Anemia, unspecified: Secondary | ICD-10-CM | POA: Diagnosis not present

## 2018-03-03 DIAGNOSIS — Z794 Long term (current) use of insulin: Secondary | ICD-10-CM | POA: Insufficient documentation

## 2018-03-03 DIAGNOSIS — Z853 Personal history of malignant neoplasm of breast: Secondary | ICD-10-CM | POA: Insufficient documentation

## 2018-03-03 DIAGNOSIS — I252 Old myocardial infarction: Secondary | ICD-10-CM | POA: Insufficient documentation

## 2018-03-03 DIAGNOSIS — I2510 Atherosclerotic heart disease of native coronary artery without angina pectoris: Secondary | ICD-10-CM | POA: Insufficient documentation

## 2018-03-03 DIAGNOSIS — Z79899 Other long term (current) drug therapy: Secondary | ICD-10-CM | POA: Diagnosis not present

## 2018-03-03 DIAGNOSIS — Z87891 Personal history of nicotine dependence: Secondary | ICD-10-CM | POA: Insufficient documentation

## 2018-03-03 DIAGNOSIS — I10 Essential (primary) hypertension: Secondary | ICD-10-CM | POA: Insufficient documentation

## 2018-03-03 DIAGNOSIS — R079 Chest pain, unspecified: Secondary | ICD-10-CM | POA: Diagnosis present

## 2018-03-03 LAB — BASIC METABOLIC PANEL
ANION GAP: 13 (ref 5–15)
BUN: 14 mg/dL (ref 8–23)
CO2: 23 mmol/L (ref 22–32)
Calcium: 9.4 mg/dL (ref 8.9–10.3)
Chloride: 100 mmol/L (ref 98–111)
Creatinine, Ser: 1.66 mg/dL — ABNORMAL HIGH (ref 0.44–1.00)
GFR calc Af Amer: 36 mL/min — ABNORMAL LOW (ref >60)
GFR calc non Af Amer: 31 mL/min — ABNORMAL LOW (ref >60)
Glucose, Bld: 123 mg/dL — ABNORMAL HIGH (ref 70–99)
Potassium: 4.3 mmol/L (ref 3.5–5.1)
Sodium: 136 mmol/L (ref 135–145)

## 2018-03-03 LAB — LIPASE, BLOOD: Lipase: 27 U/L (ref 11–51)

## 2018-03-03 LAB — CBC
HEMATOCRIT: 28.3 % — AB (ref 36.0–46.0)
Hemoglobin: 7.6 g/dL — ABNORMAL LOW (ref 12.0–15.0)
MCH: 21.5 pg — ABNORMAL LOW (ref 26.0–34.0)
MCHC: 26.9 g/dL — ABNORMAL LOW (ref 30.0–36.0)
MCV: 80.2 fL (ref 80.0–100.0)
Platelets: 393 10*3/uL (ref 150–400)
RBC: 3.53 MIL/uL — ABNORMAL LOW (ref 3.87–5.11)
RDW: 16.1 % — ABNORMAL HIGH (ref 11.5–15.5)
WBC: 10.6 10*3/uL — ABNORMAL HIGH (ref 4.0–10.5)
nRBC: 0 % (ref 0.0–0.2)

## 2018-03-03 LAB — I-STAT TROPONIN, ED
Troponin i, poc: 0.01 ng/mL (ref 0.00–0.08)
Troponin i, poc: 0 ng/mL (ref 0.00–0.08)

## 2018-03-03 LAB — D-DIMER, QUANTITATIVE (NOT AT ARMC): D DIMER QUANT: 1.63 ug{FEU}/mL — AB (ref 0.00–0.50)

## 2018-03-03 LAB — POC OCCULT BLOOD, ED: Fecal Occult Bld: NEGATIVE

## 2018-03-03 MED ORDER — FAMOTIDINE IN NACL 20-0.9 MG/50ML-% IV SOLN
20.0000 mg | Freq: Once | INTRAVENOUS | Status: AC
  Administered 2018-03-03: 20 mg via INTRAVENOUS
  Filled 2018-03-03: qty 50

## 2018-03-03 MED ORDER — LIDOCAINE VISCOUS HCL 2 % MT SOLN
15.0000 mL | Freq: Once | OROMUCOSAL | Status: AC
  Administered 2018-03-03: 15 mL via ORAL
  Filled 2018-03-03: qty 15

## 2018-03-03 MED ORDER — HYDROCODONE-ACETAMINOPHEN 5-325 MG PO TABS: 1.0000 | ORAL_TABLET | ORAL | 0 refills | Status: DC | PRN

## 2018-03-03 MED ORDER — ONDANSETRON HCL 4 MG/2ML IJ SOLN
4.0000 mg | Freq: Once | INTRAMUSCULAR | Status: AC
  Administered 2018-03-03: 4 mg via INTRAVENOUS
  Filled 2018-03-03: qty 2

## 2018-03-03 MED ORDER — IOPAMIDOL (ISOVUE-370) INJECTION 76%
INTRAVENOUS | Status: AC
  Filled 2018-03-03: qty 100

## 2018-03-03 MED ORDER — MORPHINE SULFATE (PF) 4 MG/ML IV SOLN
4.0000 mg | Freq: Once | INTRAVENOUS | Status: AC
  Administered 2018-03-03: 4 mg via INTRAVENOUS
  Filled 2018-03-03: qty 1

## 2018-03-03 MED ORDER — ALUM & MAG HYDROXIDE-SIMETH 200-200-20 MG/5ML PO SUSP
30.0000 mL | Freq: Once | ORAL | Status: AC
  Administered 2018-03-03: 30 mL via ORAL
  Filled 2018-03-03: qty 30

## 2018-03-03 MED ORDER — IOPAMIDOL (ISOVUE-370) INJECTION 76%
60.0000 mL | Freq: Once | INTRAVENOUS | Status: AC | PRN
  Administered 2018-03-03: 60 mL via INTRAVENOUS

## 2018-03-03 NOTE — ED Notes (Signed)
Pt enroute to x ray

## 2018-03-03 NOTE — Discharge Instructions (Addendum)
You will need to get your blood count rechecked by your doctor in 1 week.

## 2018-03-03 NOTE — Progress Notes (Signed)
Discharge instructions (including medications) discussed with and copy provided to patient/caregiver.  Pt verbalizes understanding of d/c instructions.

## 2018-03-03 NOTE — ED Provider Notes (Signed)
[place] EMERGENCY DEPARTMENT Provider Note   CSN: [id number] Arrival date & time: 03/03/18  0229     History   Chief Complaint Chief Complaint  Patient presents with  . Chest Pain    HPI [name] is a 68 y.o. female.  Pt presents to the ED today with CP. She has a hx of right breast cancer, CAD s/p RCA and OM stant followed by Dr. Terrence Dupont.  She did see Dr. Jeffie Pollock on 1/13 to monitor cardio-toxicity while undergoing herceptin chemo for her breast cancer.  She had an ECHO on the 13th which showed LVEF 55-60%.  Last stress test 04/20/16 showed no reversible ischemia or infarction. The pt has frequent CP that comes and goes, but today was worse.  She can't describe the pain, "it just hurts."  Nothing makes it worse.  No sob.  No n/v.      Past Medical History:  Diagnosis Date  . Angina   . Arthritis    HANDS"  . Asthma   . Cancer ([name]) 09/04/2017   Right Breast  . Carpal tunnel syndrome, bilateral 11/26/2016  . Cholelithiasis   . Cocaine abuse ([name]) 07/2012   per E.R. drug screen  . Coronary artery disease   . Diabetes mellitus without mention of complication    type 2  . Dysphagia    esophageal dysmotility on 03/2011 esophagram   . Fatty liver disease, nonalcoholic 5726   on Imaging.   Marland Kitchen GERD (gastroesophageal reflux disease)   . History of colonic polyps 2009   adenomatous 2009, HP in 2009, 2011, 2013.   Marland Kitchen Hyperlipidemia   . Hypertension   . Iron deficiency anemia   . Myocardial infarction ([name])    years ago, 6 stents placed  . 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 Port-A-Cath in place 02/27/2018  . Cancer of overlapping sites of right breast ([place]) 11/27/2017  . Malignant neoplasm of upper-outer quadrant of right breast in female, estrogen receptor positive ([place]) 10/03/2017  . Carpal tunnel syndrome, bilateral 11/26/2016  . 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 Abdominal pain, epigastric   . Acute on chronic renal failure ([place]) 02/19/2015  . Diarrhea   . Nausea with vomiting   . Hyperglycemia 04/01/2014  . Hyperosmolar non-ketotic state in patient with type 2 diabetes mellitus ([name]) 04/01/2014  . Nausea vomiting and diarrhea 04/01/2014  . CAD (coronary artery disease) 04/01/2014  . Dehydration   . Diabetic ketoacidosis without coma associated with diabetes mellitus due to underlying condition ([name])   . High anion gap metabolic acidosis   . Cocaine abuse ([name])   . Toxic encephalopathy-Unlikely metabolic,?psychogenic 08/02/2012  . Bereavement 02/02/2012  . Depression 02/02/2012  . Iron deficiency anemia, unspecified 11/30/2011  . Nonspecific abnormal finding in stool contents 11/30/2011  . Benign neoplasm of colon 11/30/2011  . Cough 05/14/2011  . Chest pain 03/19/2011  . Anemia 03/19/2011  . Diabetes mellitus ([name]) 03/19/2011  . DOE (dyspnea on exertion) 03/19/2011  . Chest pain at rest 02/18/2011    Class: Acute  . Cholelithiasis 11/01/2010  . DM 04/08/2009  . HYPERLIPIDEMIA 04/08/2009  . Essential hypertension 04/08/2009  . MYOCARDIAL INFARCTION 04/08/2009  . Coronary atherosclerosis 04/08/2009  . GERD 04/08/2009  . FATTY LIVER DISEASE 04/08/2009  . COLONIC POLYPS, HX OF 04/08/2009    Past Surgical History:  Procedure Laterality Date  . ABDOMINAL ANGIOGRAM  02/20/2011   Procedure: ABDOMINAL ANGIOGRAM;  Surgeon: Clent Demark, MD;  Location: [facility] CATH LAB;  Service: Cardiovascular;;  . CARPAL TUNNEL RELEASE Right 07/04/2017   Procedure: RIGHT CARPAL TUNNEL RELEASE;  Surgeon: Leanora Cover, MD;  Location: [facility];  Service: Orthopedics;  Laterality: Right;  . CARPAL TUNNEL RELEASE Left 09/12/2017   Procedure: LEFT CARPAL TUNNEL RELEASE;  Surgeon: Leanora Cover, MD;  Location: [facility];  Service: Orthopedics;  Laterality: Left;  . COLONOSCOPY  11/30/2011   Procedure: COLONOSCOPY;  Surgeon: Lafayette Dragon, MD;  Location: [facility] ENDOSCOPY;  Service: Endoscopy;  Laterality: N/A;  . CORONARY ANGIOPLASTY WITH STENT PLACEMENT    . ESOPHAGOGASTRODUODENOSCOPY  11/30/2011   Procedure: ESOPHAGOGASTRODUODENOSCOPY (EGD);  Surgeon: Lafayette Dragon, MD;  Location: [facility];  Service: Endoscopy;  Laterality: N/A;  . ESOPHAGOGASTRODUODENOSCOPY N/A 02/23/2015   Procedure: ESOPHAGOGASTRODUODENOSCOPY (EGD);  Surgeon: Gatha Mayer, MD;  Location: [facility] ENDOSCOPY;  Service: Endoscopy;  Laterality: N/A;  . LEFT HEART CATHETERIZATION WITH CORONARY ANGIOGRAM N/A 02/20/2011   Procedure: LEFT HEART CATHETERIZATION WITH CORONARY ANGIOGRAM;  Surgeon: Clent Demark, MD;  Location: [facility] CATH LAB;  Service: Cardiovascular;  Laterality: N/A;  . MASTECTOMY Right 11/05/2017  . MASTECTOMY W/ SENTINEL NODE BIOPSY Right 11/27/2017  . MASTECTOMY W/ SENTINEL NODE BIOPSY Right 11/27/2017   Procedure: RIGHT TOTAL MASTECTOMY WITH SENTINEL LYMPH NODE BIOPSY;  Surgeon: Excell Seltzer, MD;  Location: [facility];  Service: General;  Laterality: Right;  . PORTACATH PLACEMENT Left 11/27/2017   Procedure: INSERTION PORT-A-CATH;  Surgeon: Excell Seltzer, MD;  Location: [place] [facility];  Service: General;  Laterality: Left;  . RIGHT COLECTOMY  02/2007   for post polypectomy colonic perforation  . TUBAL LIGATION       OB History   No obstetric history on file.      Home Medications    Prior to Admission medications   Medication Sig Start Date End Date Taking? Authorizing Provider  albuterol (PROVENTIL) (2.5 MG/3ML) 0.083% nebulizer solution Take 2.5 mg by nebulization every 6 (six) hours as needed for wheezing or shortness of breath.    Yes [provider]  ALPRAZolam (XANAX) 0.25 MG tablet Take 0.25 mg by mouth 2 (two) times daily.    Yes [provider]  amLODipine (NORVASC) 5 MG tablet Take 5 mg by mouth daily.   Yes [provider]  anastrozole (ARIMIDEX) 1 MG tablet Take 1 tablet (1 mg total) by mouth daily.  02/27/18  Yes Nicholas Lose, MD  aspirin EC 81 MG EC tablet Take 1 tablet (81 mg total) by mouth daily. 02/06/12  Yes Patrecia Pour, NP  atorvastatin (LIPITOR) 20 MG tablet Take 20 mg by mouth daily.   Yes [provider]  budesonide-formoterol (SYMBICORT) 160-4.5 MCG/ACT inhaler Inhale 1 puff into the lungs 2 (two) times daily.    Yes [provider]  feeding supplement, ENSURE ENLIVE, (ENSURE ENLIVE) LIQD Take 237 mLs by mouth 2 (two) times daily between meals. 02/24/15  Yes Ghimire, Henreitta Leber, MD  Insulin Glargine (LANTUS SOLOSTAR) 100 UNIT/ML Solostar Pen Inject 20 Units into the skin daily at 10 pm. Patient taking differently: Inject 40 Units into the skin 2 (two) times daily.  02/24/15  Yes Ghimire, Henreitta Leber, MD  loperamide (IMODIUM) 2 MG capsule Take 1 capsule (2 mg total) by mouth 4 (four) times daily as needed for diarrhea or loose stools. 02/27/15  Yes Westfall, Guadelupe Sabin, PA-C  metFORMIN (GLUCOPHAGE) 1000 MG tablet Take  500 mg by mouth 2 (two) times daily with a meal.    Yes [provider]  metoprolol succinate (TOPROL-XL) 50 MG 24 hr tablet Take 50 mg by mouth at bedtime. Take with or immediately following a meal.   Yes [provider]  nitroGLYCERIN (NITROSTAT) 0.4 MG SL tablet Place 1 tablet (0.4 mg total) under the tongue every 5 (five) minutes as needed. For chest pain Patient taking differently: Place 0.4 mg under the tongue every 5 (five) minutes as needed for chest pain.  02/06/12  Yes Patrecia Pour, NP  Omega-3 Fatty Acids (FISH OIL) 1000 MG CAPS Take 1,000 mg by mouth daily.   Yes [provider]  ondansetron (ZOFRAN) 8 MG tablet Take 1 tablet (8 mg total) by mouth every 8 (eight) hours as needed for nausea or vomiting. 12/24/17  Yes Nicholas Lose, MD  pantoprazole (PROTONIX) 40 MG tablet Take 1 tablet (40 mg total) by mouth daily. 02/24/15  Yes Ghimire, Henreitta Leber, MD  HYDROcodone-acetaminophen (NORCO/VICODIN) 5-325 MG tablet Take 1  tablet by mouth every 4 (four) hours as needed. 03/03/18   Isla Pence, MD  lidocaine-prilocaine (EMLA) cream Apply to affected area once Patient not taking: Reported on 03/03/2018 01/14/18   Magrinat, Virgie Dad, MD    Family History Family History  Problem Relation Age of Onset  . Heart disease Mother   . Diabetes Mother   . Cancer Father        unsure what kind  . Diabetes Sister   . Colon cancer Neg Hx   . Breast cancer Neg Hx     Social History Social History   Tobacco Use  . Smoking status: Former Smoker    Packs/day: 0.50    Years: 47.00    Pack years: 23.50    Types: Cigarettes    Last attempt to quit: 05/09/2010    Years since quitting: 7.8  . Smokeless tobacco: Never Used  Substance Use Topics  . Alcohol use: No  . Drug use: Not Currently    Types: Cocaine    Comment: positive drug screen (cocaine, benzo's)07/2012 in emergency room     Allergies   Patient has no known allergies.   Review of Systems Review of Systems  Cardiovascular: Positive for chest pain.  All other systems reviewed and are negative.    Physical Exam Updated Vital Signs BP (!) 143/58 (BP Location: Right Arm)   Pulse 80   Temp 98.6 F (37 C) (Oral)   Resp 16   Ht 4\' 11"  (1.499 m)   Wt 57 kg   SpO2 97%   BMI 25.38 kg/m   Physical Exam Vitals signs and nursing note reviewed.  Constitutional:      Appearance: She is well-developed.  HENT:     Head: Normocephalic and atraumatic.  Eyes:     Extraocular Movements: Extraocular movements intact.     Pupils: Pupils are equal, round, and reactive to light.  Neck:     Musculoskeletal: Normal range of motion and neck supple.  Cardiovascular:     Rate and Rhythm: Normal rate and regular rhythm.     Heart sounds: Normal heart sounds.  Pulmonary:     Effort: Pulmonary effort is normal.     Breath sounds: Normal breath sounds.  Chest:     Comments: Right sided mastectomy Anterior chest wall tenderness Abdominal:      Palpations: Abdomen is soft.  Genitourinary:    Rectum: Guaiac result negative.  Musculoskeletal: Normal range  of motion.  Skin:    General: Skin is warm and dry.     Capillary Refill: Capillary refill takes less than 2 seconds.  Neurological:     General: No focal deficit present.     Mental Status: She is alert and oriented to person, place, and time.  Psychiatric:        Mood and Affect: Mood normal.        Behavior: Behavior normal.      ED Treatments / Results  Labs (all labs ordered are listed, but only abnormal results are displayed) Labs Reviewed  BASIC METABOLIC PANEL - Abnormal; Notable for the following components:      Result Value   Glucose, Bld 123 (*)    Creatinine, Ser 1.66 (*)    GFR calc non Af Amer 31 (*)    GFR calc Af Amer 36 (*)    All other components within normal limits  CBC - Abnormal; Notable for the following components:   WBC 10.6 (*)    RBC 3.53 (*)    Hemoglobin 7.6 (*)    HCT 28.3 (*)    MCH 21.5 (*)    MCHC 26.9 (*)    RDW 16.1 (*)    All other components within normal limits  D-DIMER, QUANTITATIVE (NOT AT [facility]) - Abnormal; Notable for the following components:   D-Dimer, Quant 1.63 (*)    All other components within normal limits  LIPASE, BLOOD  I-STAT TROPONIN, ED  POC OCCULT BLOOD, ED  I-STAT TROPONIN, ED    EKG EKG Interpretation  Date/Time:  Monday March 03 2018 02:32:59 EST Ventricular Rate:  76 PR Interval:    QRS Duration: 85 QT Interval:  377 QTC Calculation: 424 R Axis:   0 Text Interpretation:  Sinus rhythm Nonspecific T abnrm, anterolateral leads Baseline wander in lead(s) III No significant change since last tracing Confirmed by Isla Pence [phone]) on 03/03/2018 2:44:41 AM   Radiology Dg Chest 2 View  Result Date: 03/03/2018 CLINICAL DATA:  Chest pain. EXAM: CHEST - 2 VIEW COMPARISON:  Radiographs 4 days prior 02/27/2018. Chest CT 12/18/2017 FINDINGS: Tip of the left chest port is in the proximal,  unchanged from prior exams. Unchanged heart size and mediastinal contours with aortic atherosclerosis. Borderline cardiomegaly. No confluent consolidation, pleural effusion or pneumothorax. Surgical clips in the right axilla. No acute osseous abnormalities. IMPRESSION: 1. No acute findings or change from prior exam. 2. Borderline cardiomegaly with Aortic Atherosclerosis (ICD10-I70.0). Electronically Signed   By: Keith Rake M.D.   On: 03/03/2018 03:03   Ct Angio Chest Pe W And/or Wo Contrast  Result Date: 03/03/2018 CLINICAL DATA:  High pretest probability for pulmonary embolism. Chest pain EXAM: CT ANGIOGRAPHY CHEST WITH CONTRAST TECHNIQUE: Multidetector CT imaging of the chest was performed using the standard protocol during bolus administration of intravenous contrast. Multiplanar CT image reconstructions and MIPs were obtained to evaluate the vascular anatomy. CONTRAST:  18mL ISOVUE-370 IOPAMIDOL (ISOVUE-370) INJECTION 76% COMPARISON:  12/18/2017 FINDINGS: Cardiovascular: Satisfactory opacification of the pulmonary arteries to the segmental level. No evidence of pulmonary embolism. Normal heart size. No pericardial effusion. Diffuse atherosclerotic calcification of the aorta. Coronary atherosclerotic calcification. Left-sided porta catheter with tip at the left brachiocephalic SVC junction. Mediastinum/Nodes: Negative for adenopathy or mass. Lungs/Pleura: Mild hazy density at the bases attributed atelectasis. Minimal centrilobular emphysema. There is no edema, consolidation, effusion, or pneumothorax. Upper Abdomen: Negative Musculoskeletal: No acute or aggressive finding. Midthoracic disc degeneration. Other: Right mastectomy and axillary dissection Review  of the MIP images confirms the above findings. IMPRESSION: Negative for pulmonary embolism or other acute finding Electronically Signed   By: Monte Fantasia M.D.   On: 03/03/2018 05:43    Procedures Procedures (including critical care  time)  Medications Ordered in ED Medications  morphine 4 MG/ML injection 4 mg (4 mg Intravenous Given 03/03/18 0452)  ondansetron (ZOFRAN) injection 4 mg (4 mg Intravenous Given 03/03/18 0451)  iopamidol (ISOVUE-370) 76 % injection 60 mL (60 mLs Intravenous Contrast Given 03/03/18 0520)  alum & mag hydroxide-simeth (MAALOX/MYLANTA) 200-200-20 MG/5ML suspension 30 mL (30 mLs Oral Given 03/03/18 0645)    And  lidocaine (XYLOCAINE) 2 % viscous mouth solution 15 mL (15 mLs Oral Given 03/03/18 0644)  famotidine (PEPCID) IVPB 20 mg premix (0 mg Intravenous Stopped 03/03/18 0810)     Initial Impression / Assessment and Plan / ED Course  I have reviewed the triage vital signs and the nursing notes.  Pertinent labs & imaging results that were available during my care of the patient were reviewed by me and considered in my medical decision making (see chart for details).    Pt's hemoglobin has dropped steadily over the last few months.  Stool guaiac is negative today.  It is likely from Herceptin, but she may need to see GI.  CP is atypical for CAD.  Pain seems more epigastric in nature, so she was treated with gi cocktail and pepcid.  DDimer is + (even if age adjusted) and she is at a higher risk due to breast cancer, so CT angio was done.  This was negative.  2nd troponin negative.  Pt is stable for d/c.  Final Clinical Impressions(s) / ED Diagnoses   Final diagnoses:  Atypical chest pain  Anemia, unspecified type    ED Discharge Orders         Ordered    HYDROcodone-acetaminophen (NORCO/VICODIN) 5-325 MG tablet  Every 4 hours PRN     03/03/18 1610           Isla Pence, MD 03/04/18 0124

## 2018-03-03 NOTE — ED Triage Notes (Signed)
Pt transported from home by EMS for c/o chest pain around R mastectomy site since October. Pain worse tonight, pt unable to sleep.

## 2018-03-10 ENCOUNTER — Other Ambulatory Visit: Payer: Self-pay

## 2018-03-10 ENCOUNTER — Ambulatory Visit: Payer: Self-pay

## 2018-03-10 ENCOUNTER — Ambulatory Visit: Payer: Self-pay | Admitting: Hematology and Oncology

## 2018-03-14 ENCOUNTER — Other Ambulatory Visit: Payer: Self-pay

## 2018-03-14 ENCOUNTER — Ambulatory Visit: Payer: Self-pay | Admitting: Hematology and Oncology

## 2018-03-14 ENCOUNTER — Ambulatory Visit: Payer: Self-pay

## 2018-03-20 ENCOUNTER — Other Ambulatory Visit: Payer: Self-pay

## 2018-03-20 ENCOUNTER — Inpatient Hospital Stay: Payer: Medicare Other | Attending: Oncology

## 2018-03-20 ENCOUNTER — Ambulatory Visit: Payer: Self-pay | Admitting: Adult Health

## 2018-03-20 VITALS — BP 144/66 | HR 70 | Temp 99.2°F | Resp 18

## 2018-03-20 DIAGNOSIS — Z5112 Encounter for antineoplastic immunotherapy: Secondary | ICD-10-CM | POA: Insufficient documentation

## 2018-03-20 DIAGNOSIS — C50411 Malignant neoplasm of upper-outer quadrant of right female breast: Secondary | ICD-10-CM | POA: Diagnosis present

## 2018-03-20 DIAGNOSIS — Z170 Estrogen receptor positive status [ER+]: Secondary | ICD-10-CM | POA: Diagnosis not present

## 2018-03-20 MED ORDER — ACETAMINOPHEN 325 MG PO TABS
650.0000 mg | ORAL_TABLET | Freq: Once | ORAL | Status: AC
  Administered 2018-03-20: 650 mg via ORAL

## 2018-03-20 MED ORDER — SODIUM CHLORIDE 0.9 % IV SOLN
Freq: Once | INTRAVENOUS | Status: AC
  Administered 2018-03-20: 14:00:00 via INTRAVENOUS
  Filled 2018-03-20: qty 250

## 2018-03-20 MED ORDER — ACETAMINOPHEN 325 MG PO TABS
ORAL_TABLET | ORAL | Status: AC
  Filled 2018-03-20: qty 2

## 2018-03-20 MED ORDER — DIPHENHYDRAMINE HCL 25 MG PO CAPS
ORAL_CAPSULE | ORAL | Status: AC
  Filled 2018-03-20: qty 2

## 2018-03-20 MED ORDER — TRASTUZUMAB CHEMO 150 MG IV SOLR
6.0000 mg/kg | Freq: Once | INTRAVENOUS | Status: AC
  Administered 2018-03-20: 357 mg via INTRAVENOUS
  Filled 2018-03-20: qty 17

## 2018-03-20 MED ORDER — DIPHENHYDRAMINE HCL 25 MG PO CAPS
50.0000 mg | ORAL_CAPSULE | Freq: Once | ORAL | Status: AC
  Administered 2018-03-20: 50 mg via ORAL

## 2018-03-20 MED ORDER — SODIUM CHLORIDE 0.9% FLUSH
10.0000 mL | INTRAVENOUS | Status: DC | PRN
  Administered 2018-03-20: 10 mL
  Filled 2018-03-20: qty 10

## 2018-03-20 MED ORDER — HEPARIN SOD (PORK) LOCK FLUSH 100 UNIT/ML IV SOLN
500.0000 [IU] | Freq: Once | INTRAVENOUS | Status: AC | PRN
  Administered 2018-03-20: 500 [IU]
  Filled 2018-03-20: qty 5

## 2018-03-20 NOTE — Patient Instructions (Signed)
[facility] Discharge Instructions for Patients Receiving Chemotherapy  Today you received the following chemotherapy agents Herceptin  To help prevent nausea and vomiting after your treatment, we encourage you to take your nausea medication as directed   If you develop nausea and vomiting that is not controlled by your nausea medication, call the clinic.   BELOW ARE SYMPTOMS THAT SHOULD BE REPORTED IMMEDIATELY:  *FEVER GREATER THAN 100.5 F  *CHILLS WITH OR WITHOUT FEVER  NAUSEA AND VOMITING THAT IS NOT CONTROLLED WITH YOUR NAUSEA MEDICATION  *UNUSUAL SHORTNESS OF BREATH  *UNUSUAL BRUISING OR BLEEDING  TENDERNESS IN MOUTH AND THROAT WITH OR WITHOUT PRESENCE OF ULCERS  *URINARY PROBLEMS  *BOWEL PROBLEMS  UNUSUAL RASH Items with * indicate a potential emergency and should be followed up as soon as possible.  Feel free to call the clinic should you have any questions or concerns. The clinic phone number is [phone].  Please show the CHEMO ALERT CARD at check-in to the Emergency Department and triage nurse.   

## 2018-03-22 ENCOUNTER — Emergency Department (HOSPITAL_COMMUNITY): Payer: Medicare Other

## 2018-03-22 ENCOUNTER — Encounter (HOSPITAL_COMMUNITY): Payer: Self-pay | Admitting: *Deleted

## 2018-03-22 ENCOUNTER — Other Ambulatory Visit: Payer: Self-pay

## 2018-03-22 ENCOUNTER — Emergency Department (HOSPITAL_COMMUNITY)
Admission: EM | Admit: 2018-03-22 | Discharge: 2018-03-22 | Disposition: A | Discharge status: Home / Self Care | Payer: Medicare Other | Attending: Emergency Medicine | Admitting: Emergency Medicine

## 2018-03-22 DIAGNOSIS — I120 Hypertensive chronic kidney disease with stage 5 chronic kidney disease or end stage renal disease: Secondary | ICD-10-CM | POA: Insufficient documentation

## 2018-03-22 DIAGNOSIS — Z853 Personal history of malignant neoplasm of breast: Secondary | ICD-10-CM | POA: Insufficient documentation

## 2018-03-22 DIAGNOSIS — R110 Nausea: Secondary | ICD-10-CM | POA: Diagnosis not present

## 2018-03-22 DIAGNOSIS — E119 Type 2 diabetes mellitus without complications: Secondary | ICD-10-CM | POA: Insufficient documentation

## 2018-03-22 DIAGNOSIS — N186 End stage renal disease: Secondary | ICD-10-CM | POA: Diagnosis not present

## 2018-03-22 DIAGNOSIS — R1011 Right upper quadrant pain: Secondary | ICD-10-CM | POA: Insufficient documentation

## 2018-03-22 DIAGNOSIS — R072 Precordial pain: Secondary | ICD-10-CM | POA: Insufficient documentation

## 2018-03-22 DIAGNOSIS — Z87891 Personal history of nicotine dependence: Secondary | ICD-10-CM | POA: Insufficient documentation

## 2018-03-22 DIAGNOSIS — Z79899 Other long term (current) drug therapy: Secondary | ICD-10-CM | POA: Diagnosis not present

## 2018-03-22 DIAGNOSIS — Z794 Long term (current) use of insulin: Secondary | ICD-10-CM | POA: Diagnosis not present

## 2018-03-22 DIAGNOSIS — T82848A Pain from vascular prosthetic devices, implants and grafts, initial encounter: Secondary | ICD-10-CM | POA: Diagnosis not present

## 2018-03-22 DIAGNOSIS — R0602 Shortness of breath: Secondary | ICD-10-CM | POA: Insufficient documentation

## 2018-03-22 DIAGNOSIS — Y712 Prosthetic and other implants, materials and accessory cardiovascular devices associated with adverse incidents: Secondary | ICD-10-CM | POA: Insufficient documentation

## 2018-03-22 LAB — CBC WITH DIFFERENTIAL/PLATELET
Abs Immature Granulocytes: 0.04 10*3/uL (ref 0.00–0.07)
Basophils Absolute: 0 10*3/uL (ref 0.0–0.1)
Basophils Relative: 1 %
Eosinophils Absolute: 0.1 10*3/uL (ref 0.0–0.5)
Eosinophils Relative: 2 %
HCT: 27.2 % — ABNORMAL LOW (ref 36.0–46.0)
Hemoglobin: 7.5 g/dL — ABNORMAL LOW (ref 12.0–15.0)
Immature Granulocytes: 1 %
Lymphocytes Relative: 23 %
Lymphs Abs: 1.6 10*3/uL (ref 0.7–4.0)
MCH: 21.6 pg — AB (ref 26.0–34.0)
MCHC: 27.6 g/dL — ABNORMAL LOW (ref 30.0–36.0)
MCV: 78.4 fL — ABNORMAL LOW (ref 80.0–100.0)
MONO ABS: 0.7 10*3/uL (ref 0.1–1.0)
Monocytes Relative: 10 %
Neutro Abs: 4.4 10*3/uL (ref 1.7–7.7)
Neutrophils Relative %: 63 %
Platelets: 528 10*3/uL — ABNORMAL HIGH (ref 150–400)
RBC: 3.47 MIL/uL — ABNORMAL LOW (ref 3.87–5.11)
RDW: 17.2 % — ABNORMAL HIGH (ref 11.5–15.5)
WBC: 6.9 10*3/uL (ref 4.0–10.5)
nRBC: 0.3 % — ABNORMAL HIGH (ref 0.0–0.2)

## 2018-03-22 LAB — LACTIC ACID, PLASMA
Lactic Acid, Venous: 1.6 mmol/L (ref 0.5–1.9)
Lactic Acid, Venous: 1.9 mmol/L (ref 0.5–1.9)

## 2018-03-22 LAB — COMPREHENSIVE METABOLIC PANEL
ALT: 17 U/L (ref 0–44)
AST: 15 U/L (ref 15–41)
Albumin: 3.4 g/dL — ABNORMAL LOW (ref 3.5–5.0)
Alkaline Phosphatase: 112 U/L (ref 38–126)
Anion gap: 8 (ref 5–15)
BUN: 14 mg/dL (ref 8–23)
CO2: 25 mmol/L (ref 22–32)
CREATININE: 1.56 mg/dL — AB (ref 0.44–1.00)
Calcium: 9.6 mg/dL (ref 8.9–10.3)
Chloride: 105 mmol/L (ref 98–111)
GFR calc non Af Amer: 33 mL/min — ABNORMAL LOW (ref >60)
GFR, EST AFRICAN AMERICAN: 39 mL/min — AB (ref >60)
Glucose, Bld: 90 mg/dL (ref 70–99)
Potassium: 4 mmol/L (ref 3.5–5.1)
Sodium: 138 mmol/L (ref 135–145)
Total Bilirubin: 0.7 mg/dL (ref 0.3–1.2)
Total Protein: 8.7 g/dL — ABNORMAL HIGH (ref 6.5–8.1)

## 2018-03-22 LAB — I-STAT TROPONIN, ED
Troponin i, poc: 0 ng/mL (ref 0.00–0.08)
Troponin i, poc: 0 ng/mL (ref 0.00–0.08)

## 2018-03-22 LAB — LIPASE, BLOOD: Lipase: 35 U/L (ref 11–51)

## 2018-03-22 MED ORDER — IOPAMIDOL (ISOVUE-370) INJECTION 76%
INTRAVENOUS | Status: AC
  Filled 2018-03-22: qty 100

## 2018-03-22 MED ORDER — FENTANYL CITRATE (PF) 100 MCG/2ML IJ SOLN
50.0000 ug | Freq: Once | INTRAMUSCULAR | Status: AC
  Administered 2018-03-22: 50 ug via INTRAVENOUS
  Filled 2018-03-22: qty 2

## 2018-03-22 MED ORDER — IOPAMIDOL (ISOVUE-370) INJECTION 76%
100.0000 mL | Freq: Once | INTRAVENOUS | Status: AC | PRN
  Administered 2018-03-22: 80 mL via INTRAVENOUS

## 2018-03-22 MED ORDER — MORPHINE SULFATE (PF) 4 MG/ML IV SOLN
4.0000 mg | Freq: Once | INTRAVENOUS | Status: AC
  Administered 2018-03-22: 4 mg via INTRAVENOUS
  Filled 2018-03-22: qty 1

## 2018-03-22 NOTE — ED Notes (Signed)
Patient transported to Ultrasound 

## 2018-03-22 NOTE — ED Notes (Signed)
Patient transported to x-ray. ?

## 2018-03-22 NOTE — ED Notes (Signed)
Help get patient undress on the monitor patient is resting with call bell in reach

## 2018-03-22 NOTE — Discharge Instructions (Signed)
You were seen in the emergency department for some chest pain that evolved into some right upper quadrant pain.  You had a CAT scan of your chest along with lab work and an EKG.  There was no evidence of a blood clot or heart attack.  Your ultrasound showed a gallstone and some sludge so it may be that your gallbladder is giving you some troubles.  We discussed this with the surgeon and he recommended that you follow-up with him as an outpatient.  We have given you his information.  Will be important for you to eat a low-fat diet.  You can use Tylenol for pain.  If you start running a high fever or worsening pain please return to the emergency department.

## 2018-03-22 NOTE — ED Provider Notes (Addendum)
[facility] EMERGENCY DEPARTMENT Provider Note   CSN: [id number] Arrival date & time: 03/22/18  5462     History   Chief Complaint Chief Complaint  Patient presents with  . Vascular Access Problem    HPI [name] is a 68 y.o. female.  The history is provided by the patient and medical records. No language interpreter was used.  Chest Pain  Pain location:  Substernal area Pain quality: pressure, sharp and stabbing   Pain radiates to:  Does not radiate Pain severity:  Moderate Onset quality:  Sudden Duration:  3 days Timing:  Constant Progression:  Waxing and waning Chronicity:  Recurrent Context: breathing and movement   Relieved by:  Nothing Worsened by:  Coughing and deep breathing Ineffective treatments:  None tried Associated symptoms: cough, diaphoresis, fatigue, nausea and shortness of breath   Associated symptoms: no abdominal pain, no back pain, no fever, no headache, no lower extremity edema, no near-syncope, no numbness, no palpitations, no syncope and no vomiting     Past Medical History:  Diagnosis Date  . Angina   . Arthritis    HANDS"  . Asthma   . Cancer ([place]) 09/04/2017   Right Breast  . Carpal tunnel syndrome, bilateral 11/26/2016  . Cholelithiasis   . Cocaine abuse ([place]) 07/2012   per E.R. drug screen  . Coronary artery disease   . Diabetes mellitus without mention of complication    type 2  . Dysphagia    esophageal dysmotility on 03/2011 esophagram   . Fatty liver disease, nonalcoholic 7035   on Imaging.   Marland Kitchen GERD (gastroesophageal reflux disease)   . History of colonic polyps 2009   adenomatous 2009, HP in 2009, 2011, 2013.   Marland Kitchen Hyperlipidemia   . Hypertension   . Iron deficiency anemia   . Myocardial infarction ([place])    years ago, 6 stents placed  . 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 Port-A-Cath in place 02/27/2018  . Cancer of overlapping  sites of right breast ([place]) 11/27/2017  . Malignant neoplasm of upper-outer quadrant of right breast in female, estrogen receptor positive ([place]) 10/03/2017  . Carpal tunnel syndrome, bilateral 11/26/2016  . 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 Abdominal pain, epigastric   . Acute on chronic renal failure ([place]) 02/19/2015  . Diarrhea   . Nausea with vomiting   . Hyperglycemia 04/01/2014  . Hyperosmolar non-ketotic state in patient with type 2 diabetes mellitus ([place]) 04/01/2014  . Nausea vomiting and diarrhea 04/01/2014  . CAD (coronary artery disease) 04/01/2014  . Dehydration   . Diabetic ketoacidosis without coma associated with diabetes mellitus due to underlying condition ([place])   . High anion gap metabolic acidosis   . Cocaine abuse ([place])   . Toxic encephalopathy-Unlikely metabolic,?psychogenic 08/02/2012  . Bereavement 02/02/2012  . Depression 02/02/2012  . Iron deficiency anemia, unspecified 11/30/2011  . Nonspecific abnormal finding in stool contents 11/30/2011  . Benign neoplasm of colon 11/30/2011  . Cough 05/14/2011  . Chest pain 03/19/2011  . Anemia 03/19/2011  . Diabetes mellitus ([place]) 03/19/2011  . DOE (dyspnea on exertion) 03/19/2011  . Chest pain at rest 02/18/2011    Class: Acute  . Cholelithiasis 11/01/2010  . DM 04/08/2009  . HYPERLIPIDEMIA 04/08/2009  . Essential hypertension 04/08/2009  . MYOCARDIAL INFARCTION 04/08/2009  . Coronary atherosclerosis 04/08/2009  . GERD 04/08/2009  . FATTY LIVER DISEASE 04/08/2009  .  COLONIC POLYPS, HX OF 04/08/2009    Past Surgical History:  Procedure Laterality Date  . ABDOMINAL ANGIOGRAM  02/20/2011   Procedure: ABDOMINAL ANGIOGRAM;  Surgeon: Clent Demark, MD;  Location: [facility] CATH LAB;  Service: Cardiovascular;;  . CARPAL TUNNEL RELEASE Right 07/04/2017   Procedure: RIGHT CARPAL TUNNEL RELEASE;  Surgeon: Leanora Cover, MD;  Location: [facility];  Service: Orthopedics;  Laterality: Right;  .  CARPAL TUNNEL RELEASE Left 09/12/2017   Procedure: LEFT CARPAL TUNNEL RELEASE;  Surgeon: Leanora Cover, MD;  Location: [facility];  Service: Orthopedics;  Laterality: Left;  . COLONOSCOPY  11/30/2011   Procedure: COLONOSCOPY;  Surgeon: Lafayette Dragon, MD;  Location: [facility] ENDOSCOPY;  Service: Endoscopy;  Laterality: N/A;  . CORONARY ANGIOPLASTY WITH STENT PLACEMENT    . ESOPHAGOGASTRODUODENOSCOPY  11/30/2011   Procedure: ESOPHAGOGASTRODUODENOSCOPY (EGD);  Surgeon: Lafayette Dragon, MD;  Location: [facility];  Service: Endoscopy;  Laterality: N/A;  . ESOPHAGOGASTRODUODENOSCOPY N/A 02/23/2015   Procedure: ESOPHAGOGASTRODUODENOSCOPY (EGD);  Surgeon: Gatha Mayer, MD;  Location: [facility] ENDOSCOPY;  Service: Endoscopy;  Laterality: N/A;  . LEFT HEART CATHETERIZATION WITH CORONARY ANGIOGRAM N/A 02/20/2011   Procedure: LEFT HEART CATHETERIZATION WITH CORONARY ANGIOGRAM;  Surgeon: Clent Demark, MD;  Location: [facility] CATH LAB;  Service: Cardiovascular;  Laterality: N/A;  . MASTECTOMY Right 11/05/2017  . MASTECTOMY W/ SENTINEL NODE BIOPSY Right 11/27/2017  . MASTECTOMY W/ SENTINEL NODE BIOPSY Right 11/27/2017   Procedure: RIGHT TOTAL MASTECTOMY WITH SENTINEL LYMPH NODE BIOPSY;  Surgeon: Excell Seltzer, MD;  Location: [facility];  Service: General;  Laterality: Right;  . PORTACATH PLACEMENT Left 11/27/2017   Procedure: INSERTION PORT-A-CATH;  Surgeon: Excell Seltzer, MD;  Location: [facility];  Service: General;  Laterality: Left;  . RIGHT COLECTOMY  02/2007   for post polypectomy colonic perforation  . TUBAL LIGATION       OB History   No obstetric history on file.      Home Medications    Prior to Admission medications   Medication Sig Start Date End Date Taking? Authorizing Provider  albuterol (PROVENTIL) (2.5 MG/3ML) 0.083% nebulizer solution Take 2.5 mg by nebulization every 6 (six) hours as needed for wheezing or shortness of breath.     [provider]  ALPRAZolam Duanne Moron) 0.25 MG  tablet Take 0.25 mg by mouth 2 (two) times daily.     [provider]  amLODipine (NORVASC) 5 MG tablet Take 5 mg by mouth daily.    [provider]  anastrozole (ARIMIDEX) 1 MG tablet Take 1 tablet (1 mg total) by mouth daily. 02/27/18   Nicholas Lose, MD  aspirin EC 81 MG EC tablet Take 1 tablet (81 mg total) by mouth daily. 02/06/12   Patrecia Pour, NP  atorvastatin (LIPITOR) 20 MG tablet Take 20 mg by mouth daily.    [provider]  budesonide-formoterol (SYMBICORT) 160-4.5 MCG/ACT inhaler Inhale 1 puff into the lungs 2 (two) times daily.     [provider]  feeding supplement, ENSURE ENLIVE, (ENSURE ENLIVE) LIQD Take 237 mLs by mouth 2 (two) times daily between meals. 02/24/15   Ghimire, Henreitta Leber, MD  HYDROcodone-acetaminophen (NORCO/VICODIN) 5-325 MG tablet Take 1 tablet by mouth every 4 (four) hours as needed. 03/03/18   Isla Pence, MD  Insulin Glargine (LANTUS SOLOSTAR) 100 UNIT/ML Solostar Pen Inject 20 Units into the skin daily at 10 pm. Patient taking differently: Inject 40 Units into the skin 2 (two) times daily.  02/24/15  Jonetta Osgood, MD  lidocaine-prilocaine (EMLA) cream Apply to affected area once Patient not taking: Reported on 03/03/2018 01/14/18   Magrinat, Virgie Dad, MD  loperamide (IMODIUM) 2 MG capsule Take 1 capsule (2 mg total) by mouth 4 (four) times daily as needed for diarrhea or loose stools. 02/27/15   Marella Chimes, PA-C  metFORMIN (GLUCOPHAGE) 1000 MG tablet Take 500 mg by mouth 2 (two) times daily with a meal.     [provider]  metoprolol succinate (TOPROL-XL) 50 MG 24 hr tablet Take 50 mg by mouth at bedtime. Take with or immediately following a meal.    [provider]  nitroGLYCERIN (NITROSTAT) 0.4 MG SL tablet Place 1 tablet (0.4 mg total) under the tongue every 5 (five) minutes as needed. For chest pain Patient taking differently: Place 0.4 mg under the tongue every 5 (five) minutes as  needed for chest pain.  02/06/12   Patrecia Pour, NP  Omega-3 Fatty Acids (FISH OIL) 1000 MG CAPS Take 1,000 mg by mouth daily.    [provider]  ondansetron (ZOFRAN) 8 MG tablet Take 1 tablet (8 mg total) by mouth every 8 (eight) hours as needed for nausea or vomiting. 12/24/17   Nicholas Lose, MD  pantoprazole (PROTONIX) 40 MG tablet Take 1 tablet (40 mg total) by mouth daily. 02/24/15   Ghimire, Henreitta Leber, MD    Family History Family History  Problem Relation Age of Onset  . Heart disease Mother   . Diabetes Mother   . Cancer Father        unsure what kind  . Diabetes Sister   . Colon cancer Neg Hx   . Breast cancer Neg Hx     Social History Social History   Tobacco Use  . Smoking status: Former Smoker    Packs/day: 0.50    Years: 47.00    Pack years: 23.50    Types: Cigarettes    Last attempt to quit: 05/09/2010    Years since quitting: 7.8  . Smokeless tobacco: Never Used  Substance Use Topics  . Alcohol use: No  . Drug use: Not Currently    Types: Cocaine    Comment: positive drug screen (cocaine, benzo's)07/2012 in emergency room     Allergies   Patient has no known allergies.   Review of Systems Review of Systems  Constitutional: Positive for diaphoresis and fatigue. Negative for chills and fever.  HENT: Negative for congestion.   Eyes: Negative for visual disturbance.  Respiratory: Positive for cough, chest tightness and shortness of breath. Negative for wheezing and stridor.   Cardiovascular: Positive for chest pain. Negative for palpitations, leg swelling, syncope and near-syncope.  Gastrointestinal: Positive for nausea. Negative for abdominal pain, constipation, diarrhea and vomiting.  Genitourinary: Negative for dysuria and flank pain.  Musculoskeletal: Negative for back pain, neck pain and neck stiffness.  Skin: Negative for rash and wound.  Neurological: Negative for light-headedness, numbness and headaches.  Psychiatric/Behavioral: Negative  for agitation.  All other systems reviewed and are negative.    Physical Exam Updated Vital Signs BP (!) 154/74 (BP Location: Left Arm)   Pulse 72   Temp 98.7 F (37.1 C) (Oral)   Resp 20   Ht 4\' 11"  (1.499 m)   Wt 57 kg   SpO2 99%   BMI 25.38 kg/m   Physical Exam Vitals signs and nursing note reviewed.  Constitutional:      General: She is not in acute distress.  Appearance: Normal appearance. She is not ill-appearing, toxic-appearing or diaphoretic.  HENT:     Head: Normocephalic.     Nose: No congestion or rhinorrhea.     Mouth/Throat:     Mouth: Mucous membranes are moist.     Pharynx: No oropharyngeal exudate or posterior oropharyngeal erythema.  Eyes:     Pupils: Pupils are equal, round, and reactive to light.  Neck:     Musculoskeletal: No neck rigidity or muscular tenderness.  Cardiovascular:     Rate and Rhythm: Normal rate.     Pulses: Normal pulses.     Heart sounds: No murmur.  Pulmonary:     Effort: Pulmonary effort is normal.     Breath sounds: Rhonchi present. No wheezing or rales.  Chest:     Chest wall: Tenderness present.    Abdominal:     General: Abdomen is flat.     Tenderness: There is no abdominal tenderness. There is no right CVA tenderness or left CVA tenderness.  Musculoskeletal:        General: No tenderness.  Skin:    Capillary Refill: Capillary refill takes less than 2 seconds.  Neurological:     General: No focal deficit present.     Mental Status: She is alert.     Sensory: No sensory deficit.     Motor: No weakness.  Psychiatric:        Mood and Affect: Mood normal.      ED Treatments / Results  Labs (all labs ordered are listed, but only abnormal results are displayed) Labs Reviewed  CBC WITH DIFFERENTIAL/PLATELET - Abnormal; Notable for the following components:      Result Value   RBC 3.47 (*)    Hemoglobin 7.5 (*)    HCT 27.2 (*)    MCV 78.4 (*)    MCH 21.6 (*)    MCHC 27.6 (*)    RDW 17.2 (*)     Platelets 528 (*)    nRBC 0.3 (*)    All other components within normal limits  COMPREHENSIVE METABOLIC PANEL - Abnormal; Notable for the following components:   Creatinine, Ser 1.56 (*)    Total Protein 8.7 (*)    Albumin 3.4 (*)    GFR calc non Af Amer 33 (*)    GFR calc Af Amer 39 (*)    All other components within normal limits  LIPASE, BLOOD  LACTIC ACID, PLASMA  LACTIC ACID, PLASMA  I-STAT TROPONIN, ED  I-STAT TROPONIN, ED    EKG EKG Interpretation  Date/Time:  Saturday March 22 2018 09:13:35 EST Ventricular Rate:  72 PR Interval:    QRS Duration: 86 QT Interval:  373 QTC Calculation: 409 R Axis:   -8 Text Interpretation:  Sinus rhythm When compared to prior, no significnat changes seen.  No STEMI Confirmed by Antony Blackbird [phone]) on 03/22/2018 3:51:48 PM   Radiology Dg Chest 2 View  Result Date: 03/22/2018 CLINICAL DATA:  Cough and chest pain.  Breast carcinoma EXAM: CHEST - 2 VIEW COMPARISON:  Chest radiograph and chest CT March 03, 2018 FINDINGS: Port-A-Cath tip is in the left innominate vein near the junction with the superior vena cava. No pneumothorax. There is no edema or consolidation. The heart size and pulmonary vascularity are normal. No adenopathy. There is aortic atherosclerosis. There is degenerative change in the left shoulder with calcification in rotator cuff tendons. There are surgical clips in the right axillary region. IMPRESSION: No edema or consolidation. Stable cardiac silhouette. Port-A-Cath  tip is in the left innominate vein. No adenopathy evident. Calcific tendinosis in the left shoulder. Aortic Atherosclerosis (ICD10-I70.0). Electronically Signed   By: Lowella Grip III M.D.   On: 03/22/2018 10:50   Ct Angio Chest Pe W And/or Wo Contrast  Result Date: 03/22/2018 CLINICAL DATA:  LEFT chest wall pain in the vicinity of the Port-A-Cath in the LEFT chest wall which began during chemotherapy infusion two days ago. Current history of RIGHT  breast cancer post mastectomy in October, 2019. EXAM: CT ANGIOGRAPHY CHEST WITH CONTRAST TECHNIQUE: Multidetector CT imaging of the chest was performed using the standard protocol during bolus administration of intravenous contrast. Multiplanar CT image reconstructions and MIPs were obtained to evaluate the vascular anatomy. CONTRAST:  80mL ISOVUE-370 IOPAMIDOL INJECTION 76% IV. COMPARISON:  CTA chest 03/03/2018.  CT chest 12/18/2017. FINDINGS: Cardiovascular: Contrast opacification of the pulmonary arteries is excellent, with nearly all of the contrast in the RIGHT heart and pulmonary arterial system. No filling defects within either main pulmonary artery or their segmental branches in either lung to suggest pulmonary embolism. Heart moderately enlarged. Severe three-vessel coronary atherosclerosis and prior RIGHT coronary artery stenting no pericardial effusion. Moderate to severe atherosclerosis involving the thoracic and proximal abdominal aorta. Ectatic ascending thoracic aorta at 3.8 cm, unchanged since November, 2019. Ectasia involving the aortic arch measuring up to 3.0 cm. Mediastinum/Nodes: No pathologically enlarged mediastinal, hilar or axillary lymph nodes. No mediastinal masses. Normal-appearing esophagus. Normal-appearing thyroid gland. Lungs/Pleura: Emphysematous changes in the UPPER lobes. Scattered areas of hyperlucency in both lungs. Scarring involving the lingula, unchanged. Dependent atelectasis posteriorly in the lower lobes. No confluent airspace consolidation. No parenchymal nodules or masses. No pleural effusions. Central airways patent without significant bronchial wall thickening. Upper Abdomen: Approximate 2 cm gallstone within the otherwise normal-appearing gallbladder. Visualized upper abdomen otherwise unremarkable for the very early arterial phase of enhancement. Musculoskeletal: Degenerative disc disease and spondylosis involving the LOWER cervical spine and the LOWER thoracic spine.  No acute findings. Other: No fluid collection or edema surrounding the port in the LEFT chest wall. Port-A-Cath tip positioned against the LATERAL wall of the proximal SVC. No evidence of recurrent tumor in the RIGHT chest wall post mastectomy. Review of the MIP images confirms the above findings. IMPRESSION: 1. No evidence of pulmonary embolism. 2. Diffuse BILATERAL LOWER airways disease as identified on prior examinations. Stable lingular scarring. No acute cardiopulmonary disease. 3. Stable cardiomegaly.  Three-vessel coronary atherosclerosis. 4. 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 Recommend annual imaging followup by CTA or MRA. This recommendation follows 2010 ACCF/AHA/AATS/ACR/ASA/SCA/SCAI/SIR/STS/SVM Guidelines for the Diagnosis and Management of Patients with Thoracic Aortic Disease. Circulation.2010; 121: U882-C003. Aortic aneurysm NOS (ICD10-I71.9) 5. Cholelithiasis. Aortic Atherosclerosis (ICD10-I70.0) and Emphysema (ICD10-J43.9). Electronically Signed   By: Evangeline Dakin M.D.   On: 03/22/2018 13:27    Procedures Procedures (including critical care time)  Medications Ordered in ED Medications  iopamidol (ISOVUE-370) 76 % injection (has no administration in time range)  fentaNYL (SUBLIMAZE) injection 50 mcg (50 mcg Intravenous Given 03/22/18 1103)  iopamidol (ISOVUE-370) 76 % injection 100 mL (80 mLs Intravenous Contrast Given 03/22/18 1225)  morphine 4 MG/ML injection 4 mg (4 mg Intravenous Given 03/22/18 1620)     Initial Impression / Assessment and Plan / ED Course  I have reviewed the triage vital signs and the nursing notes.  Pertinent labs & imaging results that were available during my care of the patient were reviewed by me  and considered in my medical decision making (see chart for details).  Clinical Course as of Mar 22 1934  Sat Mar 22, 2018  1806 Reviewed current work-up with Dr. Rosendo Gros.  He reviewed the patient's labs and  CT, ultrasound imaging.  He does not feel that this is acute cholecystitis and that the patient can follow-up with him as an outpatient.  He recommends a low-fat diet.   [MB]  1904 Delta troponin negative.   [MB]  1907 Patient states her pain is resolved.  We will p.o. trial her and see if it is possible that she can go home and follow-up with her PCP and surgery as an outpatient.   [MB]    Clinical Course User Index [MB] Hayden Rasmussen, MD    [name] is a 68 y.o. female with a past medical history significant for CAD with prior MI, hypertension, hyperlipidemia, diabetes, breast cancer status post mastectomy and currently on chemotherapy who presents with severe chest pain, shortness of breath, cough, nausea, diaphoresis.  Patient reports that for the last few days she has been having severe central chest pain.  She reports the pain is a 5 out of 10 in severity right now.  It has gotten higher over the last several days.  She reports associated shortness of breath and the pain is worsened with deep breathing or coughing.  She reports she has had a dry cough.  She reports nausea but no vomiting.  She reports diaphoresis with the pain.  She reports no fevers or chills.  She does report feeling lightheaded at times with the pain.  She is very tearful on initial evaluation.  Chart review shows that she had a work-up last month with a negative PE study.  On exam, patient's chest is tender to palpation her abdomen is nontender.  Back is nontender.  Lungs had mild coarseness in the bases bilaterally.  No murmur was appreciated.  Patient with surgical scars on her chest with no significant erythema or crepitance.  No large abscesses seen on external exam with palpation.  Clinically I am concerned about pulmonary embolism even with her negative recent PE study.  Patient will have a repeat scan given her symptoms or worsening.  She also work-up for pneumonia given the cough with her symptoms.  She  will have a delta troponin.  Anticipate reassessment after work-up.  On reassessment, patient continued to have pain.  CT scan did not show does a pulmonary blizzard but did show some cholelithiasis.  Patient was reexamined and had worsened right upper quadrant abdominal pain and epigastric tenderness.  Patient will have right upper quadrant ultrasound to further evaluate.  She will receive a delta troponin.  Anticipate discharge if ultrasound until troponin negative and patient's pain is improved.  Care signed out to Dr. Melina Copa while awaiting for work-up results.  Final Clinical Impressions(s) / ED Diagnoses   Final diagnoses:  RUQ abdominal pain  Precordial pain  Right upper quadrant abdominal pain    ED Discharge Orders    None     Clinical Impression: 1. Precordial pain   2. RUQ abdominal pain   3. Right upper quadrant abdominal pain     Disposition: Care transferred to Dr. Melina Copa while waiting for ultrasound and reassessment.  This note was prepared with assistance of Systems analyst. Occasional wrong-word or sound-a-like substitutions may have occurred due to the inherent limitations of voice recognition software.     Juanna Pudlo, Gwenyth Allegra, MD 03/22/18  [place], Gwenyth Allegra, MD 03/22/18 [phone]

## 2018-03-22 NOTE — ED Notes (Signed)
IV team at the bedside. 

## 2018-03-22 NOTE — ED Notes (Signed)
Patient verbalizes understanding of discharge instructions. Opportunity for questioning and answers were provided. Armband removed by staff, pt discharged from ED in wheelchair.  

## 2018-03-22 NOTE — ED Notes (Signed)
ED Provider at bedside. 

## 2018-03-22 NOTE — ED Notes (Signed)
Pt able to ambulate with walker to bathroom with stand-by assistance, requires minimal assistance with toileting. Pt states she uses a walker at home.

## 2018-03-22 NOTE — ED Provider Notes (Signed)
Sent from Dr. Sherry Ruffing.  68 year old female here with sharp chest pain.  Her PE study is negative but she now has some increased pain and some right upper quadrant tenderness.  She had gallstones on CT. Physical Exam  BP (!) 103/91   Pulse 63   Temp 98.7 F (37.1 C) (Oral)   Resp 14   Ht 4\' 11"  (1.499 m)   Wt 57 kg   SpO2 95%   BMI 25.38 kg/m   Physical Exam  ED Course/Procedures   Clinical Course as of Mar 23 1114  Sat Mar 22, 2018  1806 Reviewed current work-up with Dr. Rosendo Gros.  He reviewed the patient's labs and CT, ultrasound imaging.  He does not feel that this is acute cholecystitis and that the patient can follow-up with him as an outpatient.  He recommends a low-fat diet.   [MB]  1904 Delta troponin negative.   [MB]  1907 Patient states her pain is resolved.  We will p.o. trial her and see if it is possible that she can go home and follow-up with her PCP and surgery as an outpatient.   [MB]    Clinical Course User Index [MB] Hayden Rasmussen, MD    Procedures  MDM  She is pending a delta troponin and her right upper quadrant ultrasound.  Disposition per results of these tests.  Patient ultrasound read as gallstones and sludge with no evidence of acute cholecystitis.  They did comment upon some asymmetric wall thickening possible gallbladder mass.  I examined the patient she said she has had this discomfort before but it is been more persistent since Thursday.  She is tender in her right upper quadrant on exam.  Will consult surgery.       Hayden Rasmussen, MD 03/23/18 1116

## 2018-03-22 NOTE — ED Triage Notes (Signed)
Pt reports Port on Lt chest wall started to hurt while in for Chemo treatment. On last Thursday. Pt has had pain located Lt chest wall . PT tearful and anxious on arrival to room.

## 2018-04-04 ENCOUNTER — Ambulatory Visit: Payer: Self-pay

## 2018-04-09 ENCOUNTER — Other Ambulatory Visit: Payer: Self-pay

## 2018-04-09 DIAGNOSIS — C50411 Malignant neoplasm of upper-outer quadrant of right female breast: Secondary | ICD-10-CM

## 2018-04-09 DIAGNOSIS — Z170 Estrogen receptor positive status [ER+]: Secondary | ICD-10-CM

## 2018-04-09 DIAGNOSIS — Z95828 Presence of other vascular implants and grafts: Secondary | ICD-10-CM

## 2018-04-09 NOTE — Progress Notes (Signed)
Pt port placement check with last CXR from 2/15 shows the tip placement located in the left inominate vein near svc. Must be within svc in order to use for infusion. Per Dr.Gudena, okay to recheck cxr prior to infusion tomorrow. Will call and notify pt to go get her xray for port placement verification.

## 2018-04-10 ENCOUNTER — Inpatient Hospital Stay: Payer: Medicare Other | Attending: Oncology

## 2018-04-10 ENCOUNTER — Inpatient Hospital Stay: Payer: Medicare Other

## 2018-04-10 ENCOUNTER — Telehealth: Payer: Self-pay

## 2018-04-10 ENCOUNTER — Ambulatory Visit (HOSPITAL_COMMUNITY)
Admission: RE | Admit: 2018-04-10 | Discharge: 2018-04-10 | Disposition: A | Discharge status: Home / Self Care | Payer: Medicare Other | Source: Ambulatory Visit | Attending: Hematology and Oncology | Admitting: Hematology and Oncology

## 2018-04-10 ENCOUNTER — Encounter: Payer: Self-pay | Admitting: Adult Health

## 2018-04-10 ENCOUNTER — Inpatient Hospital Stay (HOSPITAL_BASED_OUTPATIENT_CLINIC_OR_DEPARTMENT_OTHER): Payer: Medicare Other | Admitting: Adult Health

## 2018-04-10 VITALS — BP 113/63 | HR 64 | Temp 98.6°F | Resp 18 | Ht 59.0 in | Wt 126.2 lb

## 2018-04-10 VITALS — BP 136/70 | HR 77 | Temp 98.8°F | Resp 17

## 2018-04-10 DIAGNOSIS — Z9011 Acquired absence of right breast and nipple: Secondary | ICD-10-CM | POA: Diagnosis not present

## 2018-04-10 DIAGNOSIS — Z87891 Personal history of nicotine dependence: Secondary | ICD-10-CM

## 2018-04-10 DIAGNOSIS — C50411 Malignant neoplasm of upper-outer quadrant of right female breast: Secondary | ICD-10-CM | POA: Insufficient documentation

## 2018-04-10 DIAGNOSIS — Z95828 Presence of other vascular implants and grafts: Secondary | ICD-10-CM

## 2018-04-10 DIAGNOSIS — D508 Other iron deficiency anemias: Secondary | ICD-10-CM

## 2018-04-10 DIAGNOSIS — Z5112 Encounter for antineoplastic immunotherapy: Secondary | ICD-10-CM | POA: Insufficient documentation

## 2018-04-10 DIAGNOSIS — Z79811 Long term (current) use of aromatase inhibitors: Secondary | ICD-10-CM | POA: Insufficient documentation

## 2018-04-10 DIAGNOSIS — Z170 Estrogen receptor positive status [ER+]: Secondary | ICD-10-CM | POA: Diagnosis present

## 2018-04-10 DIAGNOSIS — D649 Anemia, unspecified: Secondary | ICD-10-CM | POA: Insufficient documentation

## 2018-04-10 LAB — SAMPLE TO BLOOD BANK

## 2018-04-10 LAB — CMP (CANCER CENTER ONLY)
ALT: 6 U/L (ref 0–44)
AST: 9 U/L — ABNORMAL LOW (ref 15–41)
Albumin: 3.5 g/dL (ref 3.5–5.0)
Alkaline Phosphatase: 121 U/L (ref 38–126)
Anion gap: 8 (ref 5–15)
BILIRUBIN TOTAL: 0.6 mg/dL (ref 0.3–1.2)
BUN: 20 mg/dL (ref 8–23)
CO2: 24 mmol/L (ref 22–32)
Calcium: 9.2 mg/dL (ref 8.9–10.3)
Chloride: 107 mmol/L (ref 98–111)
Creatinine: 1.94 mg/dL — ABNORMAL HIGH (ref 0.44–1.00)
GFR, Est AFR Am: 30 mL/min — ABNORMAL LOW (ref >60)
GFR, Estimated: 26 mL/min — ABNORMAL LOW (ref >60)
Glucose, Bld: 63 mg/dL — ABNORMAL LOW (ref 70–99)
Potassium: 4.7 mmol/L (ref 3.5–5.1)
Sodium: 139 mmol/L (ref 135–145)
Total Protein: 8.7 g/dL — ABNORMAL HIGH (ref 6.5–8.1)

## 2018-04-10 LAB — CBC WITH DIFFERENTIAL (CANCER CENTER ONLY)
Abs Immature Granulocytes: 0.05 10*3/uL (ref 0.00–0.07)
Basophils Absolute: 0 10*3/uL (ref 0.0–0.1)
Basophils Relative: 0 %
Eosinophils Absolute: 0.2 10*3/uL (ref 0.0–0.5)
Eosinophils Relative: 2 %
HCT: 25.9 % — ABNORMAL LOW (ref 36.0–46.0)
Hemoglobin: 6.9 g/dL — CL (ref 12.0–15.0)
Immature Granulocytes: 1 %
Lymphocytes Relative: 26 %
Lymphs Abs: 2 10*3/uL (ref 0.7–4.0)
MCH: 20.3 pg — AB (ref 26.0–34.0)
MCHC: 26.6 g/dL — AB (ref 30.0–36.0)
MCV: 76.2 fL — ABNORMAL LOW (ref 80.0–100.0)
MONO ABS: 1 10*3/uL (ref 0.1–1.0)
Monocytes Relative: 13 %
Neutro Abs: 4.4 10*3/uL (ref 1.7–7.7)
Neutrophils Relative %: 58 %
Platelet Count: 394 10*3/uL (ref 150–400)
RBC: 3.4 MIL/uL — ABNORMAL LOW (ref 3.87–5.11)
RDW: 17.7 % — ABNORMAL HIGH (ref 11.5–15.5)
WBC Count: 7.6 10*3/uL (ref 4.0–10.5)
nRBC: 0 % (ref 0.0–0.2)

## 2018-04-10 LAB — PREPARE RBC (CROSSMATCH)

## 2018-04-10 LAB — ABO/RH: ABO/RH(D): AB POS

## 2018-04-10 MED ORDER — SODIUM CHLORIDE 0.9% FLUSH
10.0000 mL | INTRAVENOUS | Status: DC | PRN
  Administered 2018-04-10: 10 mL
  Filled 2018-04-10: qty 10

## 2018-04-10 MED ORDER — DIPHENHYDRAMINE HCL 25 MG PO CAPS
50.0000 mg | ORAL_CAPSULE | Freq: Once | ORAL | Status: AC
  Administered 2018-04-10: 50 mg via ORAL

## 2018-04-10 MED ORDER — SODIUM CHLORIDE 0.9 % IV SOLN
Freq: Once | INTRAVENOUS | Status: AC
  Administered 2018-04-10: 16:00:00 via INTRAVENOUS
  Filled 2018-04-10: qty 250

## 2018-04-10 MED ORDER — SODIUM CHLORIDE 0.9% IV SOLUTION
250.0000 mL | Freq: Once | INTRAVENOUS | Status: AC
  Administered 2018-04-10: 250 mL via INTRAVENOUS
  Filled 2018-04-10: qty 250

## 2018-04-10 MED ORDER — DIPHENHYDRAMINE HCL 25 MG PO CAPS
ORAL_CAPSULE | ORAL | Status: AC
  Filled 2018-04-10: qty 2

## 2018-04-10 MED ORDER — ACETAMINOPHEN 325 MG PO TABS
650.0000 mg | ORAL_TABLET | Freq: Once | ORAL | Status: AC
  Administered 2018-04-10: 650 mg via ORAL

## 2018-04-10 MED ORDER — TRASTUZUMAB CHEMO 150 MG IV SOLR
6.0000 mg/kg | Freq: Once | INTRAVENOUS | Status: AC
  Administered 2018-04-10: 357 mg via INTRAVENOUS
  Filled 2018-04-10: qty 17

## 2018-04-10 MED ORDER — ACETAMINOPHEN 325 MG PO TABS
ORAL_TABLET | ORAL | Status: AC
  Filled 2018-04-10: qty 2

## 2018-04-10 MED ORDER — HEPARIN SOD (PORK) LOCK FLUSH 100 UNIT/ML IV SOLN
500.0000 [IU] | Freq: Once | INTRAVENOUS | Status: AC | PRN
  Administered 2018-04-10: 500 [IU]
  Filled 2018-04-10: qty 5

## 2018-04-10 NOTE — Progress Notes (Signed)
Per Dr.Gudena review on recent CXR of tip placement "upper SVC/brachiocephalic junction", Okay to use for infusion (herceptin).Infusion RN notified and is aware.  Pt to receive 1 unit of prbc for hg 6.9 today per MD.

## 2018-04-10 NOTE — Assessment & Plan Note (Addendum)
11/27/2017 right mastectomy: IDC grade 2, 1.6 cm, separate foci of DCIS intermediate grade, margins negative, 0/5 lymph nodes negative,ER 100%, PR 50%, Ki-67 15%, HER-2 positive ratio 2, copy #5 T1c N0 stage Ia  Treatment plan: 1.  Adjuvant Herceptin every 3 weeks for 1 year (because of poor performance status Taxol is felt to be intolerable for her) 2.  Adjuvant antiestrogen therapy with letrozole 2.5 mg daily  Patient's daughter is the primary point of contact for her and all appointment changes will need to go through her. -------------------------------------------------------------------------------------- Current treatment: Herceptin with anastrozole started 02/07/2018 Echo 02/17/2018 EF 55-60%--followed by Bensimhon  Tolerating anastrozole and Herceptin well.  Will continue this.  Has worsening anemia, now hemoglobin of 6.9 today.  Will transfuse with PRBCs, added on iron studies, and referred to GYN and GI.  Reviewed PRBC transfusion with patient, risks and benefits, and patient is agreeable to proceed.    This was reviewed in detail with Dr. Gudena.    Return to clinic in 3 weeks for labs, f/u and Herceptin.  

## 2018-04-10 NOTE — Patient Instructions (Addendum)
[facility] Discharge Instructions for Patients Receiving Chemotherapy  Today you received the following chemotherapy agents :  Herceptin.  To help prevent nausea and vomiting after your treatment, we encourage you to take your nausea medication as prescribed.   If you develop nausea and vomiting that is not controlled by your nausea medication, call the clinic.   BELOW ARE SYMPTOMS THAT SHOULD BE REPORTED IMMEDIATELY:  *FEVER GREATER THAN 100.5 F  *CHILLS WITH OR WITHOUT FEVER  NAUSEA AND VOMITING THAT IS NOT CONTROLLED WITH YOUR NAUSEA MEDICATION  *UNUSUAL SHORTNESS OF BREATH  *UNUSUAL BRUISING OR BLEEDING  TENDERNESS IN MOUTH AND THROAT WITH OR WITHOUT PRESENCE OF ULCERS  *URINARY PROBLEMS  *BOWEL PROBLEMS  UNUSUAL RASH Items with * indicate a potential emergency and should be followed up as soon as possible.  Feel free to call the clinic should you have any questions or concerns. The clinic phone number is [phone].  Please show the [facility] at check-in to the Emergency Department and triage nurse.    Blood Transfusion, Adult, Care After This sheet gives you information about how to care for yourself after your procedure. Your doctor may also give you more specific instructions. If you have problems or questions, contact your doctor. Follow these instructions at home:   Take over-the-counter and prescription medicines only as told by your doctor.  Go back to your normal activities as told by your doctor.  Follow instructions from your doctor about how to take care of the area where an IV tube was put into your vein (insertion site). Make sure you: ? Wash your hands with soap and water before you change your bandage (dressing). If there is no soap and water, use hand sanitizer. ? Change your bandage as told by your doctor.  Check your IV insertion site every day for signs of infection. Check for: ? More redness, swelling, or  pain. ? More fluid or blood. ? Warmth. ? Pus or a bad smell. Contact a doctor if:  You have more redness, swelling, or pain around the IV insertion site.  You have more fluid or blood coming from the IV insertion site.  Your IV insertion site feels warm to the touch.  You have pus or a bad smell coming from the IV insertion site.  Your pee (urine) turns pink, red, or brown.  You feel weak after doing your normal activities. Get help right away if:  You have signs of a serious allergic or body defense (immune) system reaction, including: ? Itchiness. ? Hives. ? Trouble breathing. ? Anxiety. ? Pain in your chest or lower back. ? Fever, flushing, and chills. ? Fast pulse. ? Rash. ? Watery poop (diarrhea). ? Throwing up (vomiting). ? Dark pee. ? Serious headache. ? Dizziness. ? Stiff neck. ? Yellow color in your face or the white parts of your eyes (jaundice). Summary  After a blood transfusion, return to your normal activities as told by your doctor.  Every day, check for signs of infection where the IV tube was put into your vein.  Some signs of infection are warm skin, more redness and pain, more fluid or blood, and pus or a bad smell where the needle went in.  Contact your doctor if you feel weak or have any unusual symptoms. This information is not intended to replace advice given to you by your health care provider. Make sure you discuss any questions you have with your health care provider. Document Released: 02/12/2014  Document Revised: 09/16/2015 Document Reviewed: 09/16/2015 Elsevier Interactive Patient Education  [employer].

## 2018-04-10 NOTE — Progress Notes (Signed)
[facility] Follow up:    Tsosie Billing, MD [place] [address]   DIAGNOSIS: Cancer Staging Malignant neoplasm of upper-outer quadrant of right breast in female, estrogen receptor positive ([place]) Staging form: Breast, AJCC 8th Edition - Clinical stage from 09/03/2017: Stage IB (cT2, cN0, cM0, G3, ER+, PR+, HER2+) - Signed by Gardenia Phlegm, NP on 04/10/2018 - Pathologic stage from 11/27/2017: Stage IA (pT1c, pN0, cM0, G2, ER+, PR+, HER2+) - Signed by Gardenia Phlegm, NP on 04/10/2018   SUMMARY OF ONCOLOGIC HISTORY:   Malignant neoplasm of upper-outer quadrant of right breast in female, estrogen receptor positive ([place])   09/03/2017 Initial Diagnosis    Screening mammogram detected 1.4 cm right breast mass at 10 o'clock position, additional pleomorphic calcifications 2 cm posteriorly, additional loosely grouped calcifications 10.1 x 7.1 x 5.3 cm, single right axillary lymph node: Biopsy revealed DCIS ER 100%, PR 50%; IDC grade 2-3 ER 100%, PR 50%, Ki-67 15%, HER-2 positive ratio 2, copy #5, lymph node negative T2 N0 stage Ia clinical stage AJCC 8    09/03/2017 Cancer Staging    Staging form: Breast, AJCC 8th Edition - Clinical stage from 09/03/2017: Stage IB (cT2, cN0, cM0, G3, ER+, PR+, HER2+) - Signed by Gardenia Phlegm, NP on 04/10/2018    11/27/2017 Surgery    Right mastectomy: IDC grade 2, 1.6 cm, separate foci of DCIS intermediate grade, margins negative, 0/5 lymph nodes negative,ER 100%, PR 50%, Ki-67 15%, HER-2 positive ratio 2, copy #5 T1c N0 stage Ia    11/27/2017 Cancer Staging    Staging form: Breast, AJCC 8th Edition - Pathologic stage from 11/27/2017: Stage IA (pT1c, pN0, cM0, G2, ER+, PR+, HER2+) - Signed by Gardenia Phlegm, NP on 04/10/2018    02/07/2018 -  Chemotherapy    The patient had trastuzumab (HERCEPTIN) 483 mg in sodium chloride 0.9 % 250 mL chemo infusion, 8 mg/kg = 483 mg, Intravenous,   Once, 4 of 18 cycles Administration: 483 mg (02/07/2018), 357 mg (02/27/2018), 357 mg (03/20/2018)  for chemotherapy treatment.     02/27/2018 -  Anti-estrogen oral therapy    Anastrozole '1mg'$  daily     CURRENT THERAPY: Anastrozole and Herceptin  INTERVAL HISTORY: AZALIYAH KENNARD 68 y.o. female returns for evaluation prior to receiving herceptin.  She says that she is doing ok today.  Her hemoglobin is 6.9.  She has increasing DOE, dizziness, and fatigue.  She denies chest pain, palpitations.  She denies any blood in her stool or vaginal bleeding today.  She is tolerating the Anastrozole moderately well.     Patient Active Problem List   Diagnosis Date Noted  . Port-A-Cath in place 02/27/2018  . Cancer of overlapping sites of right breast ([place]) 11/27/2017  . Malignant neoplasm of upper-outer quadrant of right breast in female, estrogen receptor positive ([place]) 10/03/2017  . Carpal tunnel syndrome, bilateral 11/26/2016  . 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 Abdominal pain, epigastric   . Acute on chronic renal failure ([place]) 02/19/2015  . Diarrhea   . Nausea with vomiting   . Hyperglycemia 04/01/2014  . Hyperosmolar non-ketotic state in patient with type 2 diabetes mellitus ([place]) 04/01/2014  . Nausea vomiting and diarrhea 04/01/2014  . CAD (coronary artery disease) 04/01/2014  . Dehydration   . Diabetic ketoacidosis without coma associated with diabetes mellitus due to underlying condition ([place])   . High anion gap metabolic acidosis   . Cocaine abuse ([place])   .  Toxic encephalopathy-Unlikely metabolic,?psychogenic 08/02/2012  . Bereavement 02/02/2012  . Depression 02/02/2012  . Iron deficiency anemia, unspecified 11/30/2011  . Nonspecific abnormal finding in stool contents 11/30/2011  . Benign neoplasm of colon 11/30/2011  . Cough 05/14/2011  . Chest pain 03/19/2011  . Anemia 03/19/2011  . Diabetes mellitus ([place]) 03/19/2011  . DOE (dyspnea on exertion) 03/19/2011  . Chest  pain at rest 02/18/2011    Class: Acute  . Cholelithiasis 11/01/2010  . DM 04/08/2009  . HYPERLIPIDEMIA 04/08/2009  . Essential hypertension 04/08/2009  . MYOCARDIAL INFARCTION 04/08/2009  . Coronary atherosclerosis 04/08/2009  . GERD 04/08/2009  . FATTY LIVER DISEASE 04/08/2009  . COLONIC POLYPS, HX OF 04/08/2009    has No Known Allergies.  MEDICAL HISTORY: Past Medical History:  Diagnosis Date  . Angina   . Arthritis    HANDS"  . Asthma   . Cancer ([facility]) 09/04/2017   Right Breast  . Carpal tunnel syndrome, bilateral 11/26/2016  . Cholelithiasis   . Cocaine abuse ([place]) 07/2012   per E.R. drug screen  . Coronary artery disease   . Diabetes mellitus without mention of complication    type 2  . Dysphagia    esophageal dysmotility on 03/2011 esophagram   . Fatty liver disease, nonalcoholic 9509   on Imaging.   Marland Kitchen GERD (gastroesophageal reflux disease)   . History of colonic polyps 2009   adenomatous 2009, HP in 2009, 2011, 2013.   Marland Kitchen Hyperlipidemia   . Hypertension   . Iron deficiency anemia   . Myocardial infarction ([place])    years ago, 6 stents placed  . Ulnar neuropathy at elbow, 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    SURGICAL HISTORY: Past Surgical History:  Procedure Laterality Date  . ABDOMINAL ANGIOGRAM  02/20/2011   Procedure: ABDOMINAL ANGIOGRAM;  Surgeon: Clent Demark, MD;  Location[facility] LAB;  Service: Cardiovascular;;  . CARPAL TUNNEL RELEASE Right 07/04/2017   Procedure: RIGHT CARPAL TUNNEL RELEASE;  Surgeon: Leanora Cover, MD;  Location: [facility];  Service: Orthopedics;  Laterality: Right;  . CARPAL TUNNEL RELEASE Left 09/12/2017   Procedure: LEFT CARPAL TUNNEL RELEASE;  Surgeon: Leanora Cover, MD;  Location: [facility];  Service: Orthopedics;  Laterality: Left;  . COLONOSCOPY  11/30/2011   Procedure: COLONOSCOPY;  Surgeon: Lafayette Dragon, MD;  Location: [facility] ENDOSCOPY;  Service: Endoscopy;  Laterality: N/A;  . CORONARY ANGIOPLASTY WITH STENT  PLACEMENT    . ESOPHAGOGASTRODUODENOSCOPY  11/30/2011   Procedure: ESOPHAGOGASTRODUODENOSCOPY (EGD);  Surgeon: Lafayette Dragon, MD;  Location: [facility];  Service: Endoscopy;  Laterality: N/A;  . ESOPHAGOGASTRODUODENOSCOPY N/A 02/23/2015   Procedure: ESOPHAGOGASTRODUODENOSCOPY (EGD);  Surgeon: Gatha Mayer, MD;  Location: [facility] ENDOSCOPY;  Service: Endoscopy;  Laterality: N/A;  . LEFT HEART CATHETERIZATION WITH CORONARY ANGIOGRAM N/A 02/20/2011   Procedure: LEFT HEART CATHETERIZATION WITH CORONARY ANGIOGRAM;  Surgeon: Clent Demark, MD;  Location: [facility] CATH LAB;  Service: Cardiovascular;  Laterality: N/A;  . MASTECTOMY Right 11/05/2017  . MASTECTOMY W/ SENTINEL NODE BIOPSY Right 11/27/2017  . MASTECTOMY W/ SENTINEL NODE BIOPSY Right 11/27/2017   Procedure: RIGHT TOTAL MASTECTOMY WITH SENTINEL LYMPH NODE BIOPSY;  Surgeon: Excell Seltzer, MD;  Location: [facility];  Service: General;  Laterality: Right;  . PORTACATH PLACEMENT Left 11/27/2017   Procedure: INSERTION PORT-A-CATH;  Surgeon: Excell Seltzer, MD;  Location: [facility];  Service: General;  Laterality: Left;  . RIGHT COLECTOMY  02/2007   for post polypectomy colonic perforation  . TUBAL LIGATION  SOCIAL HISTORY: Social History   Socioeconomic History  . Marital status: Divorced    Spouse name: Not on file  . Number of children: 2  . Years of education: Not on file  . Highest education level: Not on file  Occupational History  . Occupation: retired. prev worked in housekeeping w/ [employer]  . Financial resource strain: Not on file  . Food insecurity:    Worry: Not on file    Inability: Not on file  . Transportation needs:    Medical: Yes    Non-medical: Yes  Tobacco Use  . Smoking status: Former Smoker    Packs/day: 0.50    Years: 47.00    Pack years: 23.50    Types: Cigarettes    Last attempt to quit: 05/09/2010    Years since quitting: 7.9  . Smokeless tobacco: Never Used  Substance and Sexual Activity  .  Alcohol use: No  . Drug use: Not Currently    Types: Cocaine    Comment: positive drug screen (cocaine, benzo's)07/2012 in emergency room  . Sexual activity: Not Currently    Birth control/protection: Post-menopausal  Lifestyle  . Physical activity:    Days per week: Not on file    Minutes per session: Not on file  . Stress: Not on file  Relationships  . Social connections:    Talks on phone: Not on file    Gets together: Not on file    Attends religious service: Not on file    Active member of club or organization: Not on file    Attends meetings of clubs or organizations: Not on file    Relationship status: Not on file  . Intimate partner violence:    Fear of current or ex partner: Not on file    Emotionally abused: Not on file    Physically abused: Not on file    Forced sexual activity: Not on file  Other Topics Concern  . Not on file  Social History Narrative   Caffeine use daily.    FAMILY HISTORY: Family History  Problem Relation Age of Onset  . Heart disease Mother   . Diabetes Mother   . Cancer Father        unsure what kind  . Diabetes Sister   . Colon cancer Neg Hx   . Breast cancer Neg Hx     Review of Systems  Constitutional: Positive for fatigue. Negative for appetite change, chills, fever and unexpected weight change.  HENT:   Negative for hearing loss, lump/mass, mouth sores and trouble swallowing.   Eyes: Negative for eye problems and icterus.  Respiratory: Positive for shortness of breath. Negative for chest tightness and cough.   Cardiovascular: Negative for chest pain, leg swelling and palpitations.  Gastrointestinal: Negative for abdominal distention, abdominal pain, blood in stool, constipation, diarrhea, nausea and vomiting.  Endocrine: Negative for hot flashes.  Musculoskeletal: Negative for arthralgias.  Skin: Negative for itching and rash.  Neurological: Positive for light-headedness. Negative for dizziness, extremity weakness and headaches.   Hematological: Negative for adenopathy. Does not bruise/bleed easily.  Psychiatric/Behavioral: Negative for depression. The patient is not nervous/anxious.       PHYSICAL EXAMINATION  ECOG PERFORMANCE STATUS: 3 - Symptomatic, >50% confined to bed  Vitals:   04/10/18 1303  BP: 113/63  Pulse: 64  Resp: 18  Temp: 98.6 F (37 C)  SpO2: 100%    Physical Exam Constitutional:      Comments: Chronically ill appearing, appears  older than stated age.  Exam limited, patient examined in chair  HENT:     Head: Normocephalic and atraumatic.     Mouth/Throat:     Mouth: Mucous membranes are moist.     Pharynx: Oropharynx is clear. No oropharyngeal exudate or posterior oropharyngeal erythema.  Eyes:     General: No scleral icterus.    Pupils: Pupils are equal, round, and reactive to light.  Cardiovascular:     Heart sounds: Normal heart sounds.  Pulmonary:     Effort: Pulmonary effort is normal.     Breath sounds: Normal breath sounds.  Abdominal:     General: Abdomen is flat. Bowel sounds are normal. There is no distension.     Palpations: Abdomen is soft.     Tenderness: There is no abdominal tenderness.  Neurological:     Mental Status: She is alert.     LABORATORY DATA:  CBC    Component Value Date/Time   WBC 7.6 04/10/2018 1210   WBC 6.9 03/22/2018 1047   RBC 3.40 (L) 04/10/2018 1210   HGB 6.9 (LL) 04/10/2018 1210   HCT 25.9 (L) 04/10/2018 1210   PLT 394 04/10/2018 1210   MCV 76.2 (L) 04/10/2018 1210   MCH 20.3 (L) 04/10/2018 1210   MCHC 26.6 (L) 04/10/2018 1210   RDW 17.7 (H) 04/10/2018 1210   LYMPHSABS 2.0 04/10/2018 1210   MONOABS 1.0 04/10/2018 1210   EOSABS 0.2 04/10/2018 1210   BASOSABS 0.0 04/10/2018 1210    CMP     Component Value Date/Time   NA 139 04/10/2018 1210   K 4.7 04/10/2018 1210   CL 107 04/10/2018 1210   CO2 24 04/10/2018 1210   GLUCOSE 63 (L) 04/10/2018 1210   BUN 20 04/10/2018 1210   CREATININE 1.94 (H) 04/10/2018 1210   CALCIUM  9.2 04/10/2018 1210   PROT 8.7 (H) 04/10/2018 1210   ALBUMIN 3.5 04/10/2018 1210   AST 9 (L) 04/10/2018 1210   ALT 6 04/10/2018 1210   ALKPHOS 121 04/10/2018 1210   BILITOT 0.6 04/10/2018 1210   GFRNONAA 26 (L) 04/10/2018 1210   GFRAA 30 (L) 04/10/2018 1210        ASSESSMENT and THERAPY PLAN:   Malignant neoplasm of upper-outer quadrant of right breast in female, estrogen receptor positive ([place]) 11/27/2017 right mastectomy: IDC grade 2, 1.6 cm, separate foci of DCIS intermediate grade, margins negative, 0/5 lymph nodes negative,ER 100%, PR 50%, Ki-67 15%, HER-2 positive ratio 2, copy #5 T1c N0 stage Ia  Treatment plan: 1.  Adjuvant Herceptin every 3 weeks for 1 year (because of poor performance status Taxol is felt to be intolerable for her) 2.  Adjuvant antiestrogen therapy with letrozole 2.5 mg daily  Patient's daughter is the primary point of contact for her and all appointment changes will need to go through her. -------------------------------------------------------------------------------------- Current treatment: Herceptin with anastrozole started 02/07/2018 Echo 02/17/2018 EF 55-60%--followed by Bensimhon  Tolerating anastrozole and Herceptin well.  Will continue this.  Has worsening anemia, now hemoglobin of 6.9 today.  Will transfuse with PRBCs, added on iron studies, and referred to GYN and GI.  Reviewed PRBC transfusion with patient, risks and benefits, and patient is agreeable to proceed.    This was reviewed in detail with Dr. Lindi Adie.    Return to clinic in 3 weeks for labs, f/u and Herceptin.    Orders Placed This Encounter  Procedures  . Iron and TIBC    Standing Status:   Future  Standing Expiration Date:   04/10/2019  . Ferritin    Standing Status:   Future    Standing Expiration Date:   04/10/2019  . Occult blood card to lab, stool  . Ambulatory referral to Gastroenterology    Referral Priority:   Urgent    Referral Type:   Consultation    Referral  Reason:   Specialty Services Required    Number of Visits Requested:   1  . Ambulatory referral to Gynecology    Referral Priority:   Urgent    Referral Type:   Consultation    Referral Reason:   Specialty Services Required    Requested Specialty:   Gynecology    Number of Visits Requested:   1  . Sample to Blood Bank    Standing Status:   Future    Number of Occurrences:   1    Standing Expiration Date:   04/10/2019  . Type and screen         Standing Status:   Future    Number of Occurrences:   1    Standing Expiration Date:   04/10/2019  . Prepare RBC    Standing Status:   Standing    Number of Occurrences:   1    Order Specific Question:   # of Units    Answer:   1 unit    Order Specific Question:   Transfusion Indications    Answer:   Symptomatic Anemia    Order Specific Question:   If emergent release call blood bank    Answer:   Not emergent release    All questions were answered. The patient knows to call the clinic with any problems, questions or concerns. We can certainly see the patient much sooner if necessary.  A total of (30) minutes of face-to-face time was spent with this patient with greater than 50% of that time in counseling and care-coordination.  This note was electronically signed. Scot Dock, NP 04/10/2018

## 2018-04-10 NOTE — Telephone Encounter (Signed)
Unsuccessful attempt to contact patient yesterday x2, and today, x2 calls. Called and lvm for niece as well. Pt coming in this afternoon for herceptin infusion. Recent Xray port placement verification from ED visit shows tip in left inominate vein near svc. Per MD, obtain xray to verify tip placement. Orders placed but unable to contact pt.

## 2018-04-11 LAB — TYPE AND SCREEN
ABO/RH(D): AB POS
Antibody Screen: NEGATIVE
Unit division: 0

## 2018-04-11 LAB — BPAM RBC
Blood Product Expiration Date: 202004012359
ISSUE DATE / TIME: 202003051633
Unit Type and Rh: 6200

## 2018-04-16 ENCOUNTER — Other Ambulatory Visit: Payer: Self-pay

## 2018-04-16 ENCOUNTER — Ambulatory Visit
Admission: RE | Admit: 2018-04-16 | Discharge: 2018-04-16 | Disposition: A | Discharge status: Home / Self Care | Payer: Medicare Other | Source: Ambulatory Visit | Attending: Internal Medicine | Admitting: Internal Medicine

## 2018-04-16 ENCOUNTER — Ambulatory Visit: Payer: Self-pay | Admitting: General Surgery

## 2018-04-16 DIAGNOSIS — Z13820 Encounter for screening for osteoporosis: Secondary | ICD-10-CM

## 2018-04-25 ENCOUNTER — Ambulatory Visit: Payer: Self-pay

## 2018-04-25 ENCOUNTER — Ambulatory Visit: Payer: Self-pay | Admitting: Adult Health

## 2018-04-25 ENCOUNTER — Other Ambulatory Visit: Payer: Self-pay

## 2018-04-30 ENCOUNTER — Other Ambulatory Visit (HOSPITAL_COMMUNITY): Payer: Self-pay | Admitting: *Deleted

## 2018-04-30 ENCOUNTER — Inpatient Hospital Stay (HOSPITAL_COMMUNITY)
Admission: RE | Admit: 2018-04-30 | Discharge: 2018-04-30 | Disposition: A | Discharge status: Home / Self Care | Payer: Medicare Other | Source: Ambulatory Visit

## 2018-04-30 NOTE — Patient Instructions (Signed)
[name]    Your procedure is scheduled on: 05-07-2018  Report to [facility] Main  Entrance  Report to [place] at 630 AM    Call this number if you have problems the morning of surgery [phone]  \  Remember:  [name], NO CHEWING GUM Onward.     Take these medicines the morning of surgery with A SIP OF WATER: HYDROCODONE IF NEEDED, ALPRAZOLAM (XANAX), ALBUTEROL NEBULIZER, ANASTROZOLE (ARIMIDEX), ATORVASTATIN (LIPITOR), METOPROLOL SUCCINATE, AMLODIPINE (NORVASC), PANTAPRAZOLE (PROTONIOX)  DO NOT TAKE ANY DIABETIC MEDICATIONS DAY OF YOUR SURGERY How to Manage Your Diabetes Before and After Surgery  Why is it important to control my blood sugar before and after surgery? . Improving blood sugar levels before and after surgery helps healing and can limit problems. . A way of improving blood sugar control is eating a healthy diet by: o  Eating less sugar and carbohydrates o  Increasing activity/exercise o  Talking with your doctor about reaching your blood sugar goals . High blood sugars (greater than 180 mg/dL) can raise your risk of infections and slow your recovery, so you will need to focus on controlling your diabetes during the weeks before surgery. . Make sure that the doctor who takes care of your diabetes knows about your planned surgery including the date and location.  How do I manage my blood sugar before surgery? . Check your blood sugar at least 4 times a day, starting 2 days before surgery, to make sure that the level is not too high or low. o Check your blood sugar the morning of your surgery when you wake up and every 2 hours until you get to the Short Stay unit. . If your blood sugar is less than 70 mg/dL, you will need to treat for low blood sugar: o Do not take insulin. o Treat a low blood sugar (less than 70 mg/dL) with  cup of clear juice (cranberry or apple), 4  glucose tablets, OR glucose gel. o Recheck blood sugar in 15 minutes after treatment (to make sure it is greater than 70 mg/dL). If your blood sugar is not greater than 70 mg/dL on recheck, call [phone] for further instructions. . Report your blood sugar to the short stay nurse when you get to Short Stay.  . If you are admitted to the hospital after surgery: o Your blood sugar will be checked by the staff and you will probably be given insulin after surgery (instead of oral diabetes medicines) to make sure you have good blood sugar levels. o The goal for blood sugar control after surgery is 80-180 mg/dL.   WHAT DO I DO ABOUT MY DIABETES MEDICATION?  Marland Kitchen Do not take oral diabetes medicines (pills) the morning of surgery.  . THE DAY BEFORE SURGERY TAKE METFORMIN AS USUAL.       . THE MORNING OF SURGERY, DO NOT TAKE METFORMIN       Reviewed and Endorsed by [facility] Patient Education Committee, August 2015     NO SOLID FOOD AFTER MIDNIGHT THE NIGHT PRIOR TO SURGERY. NOTHING BY MOUTH EXCEPT CLEAR LIQUIDS UNTIL 3 HOURS PRIOR TO [place] SURGERY. PLEASE FINISH ENSURE DRINK PER SURGEON ORDER 3 HOURS PRIOR TO SCHEDULED SURGERY TIME WHICH NEEDS TO BE COMPLETED AT 530 AM.  CLEAR LIQUID DIET   Foods Allowed                                                                     Foods Excluded  Coffee and tea, regular and decaf                             liquids that you cannot  Plain Jell-O in any flavor                                             see through such as: Fruit ices (not with fruit pulp)                                     milk, soups, orange juice  Iced Popsicles                                    All solid food Carbonated beverages, regular and diet                                    Cranberry, grape and apple juices Sports drinks like Gatorade Lightly seasoned clear broth or consume(fat free) Sugar, honey syrup  Sample Menu Breakfast                                 Lunch                                     Supper Cranberry juice                    Beef broth                            Chicken broth Jell-O                                     Grape juice                           Apple juice Coffee or tea                        Jell-O                                      Popsicle  Coffee or tea                        Coffee or tea  _____________________________________________________________________               [name] may not have any metal on your body including hair pins and              piercings  Do not wear jewelry, make-up, lotions, powders or perfumes, deodorant             Do not wear nail polish.  Do not shave  48 hours prior to surgery.                Do not bring valuables to the hospital. [name].  Contacts, dentures or bridgework may not be worn into surgery.  Leave suitcase in the car. After surgery it may be brought to your room.     Patients discharged the day of surgery will not be allowed to drive home. IF YOU ARE HAVING SURGERY AND GOING HOME THE SAME DAY, YOU MUST HAVE AN ADULT TO DRIVE YOU HOME AND BE WITH YOU FOR 24 HOURS. YOU MAY GO HOME BY TAXI OR UBER OR ORTHERWISE, BUT AN ADULT MUST ACCOMPANY YOU HOME AND STAY WITH YOU FOR 24 HOURS.  Name and phone number of your driver:  Special Instructions: N/A              Please read over the following fact sheets you were given: _____________________________________________________________________             [facility] - Preparing for Surgery Before surgery, you can play an important role.  Because skin is not sterile, your skin needs to be as free of germs as possible.  You can reduce the number of germs on your skin by washing with CHG (chlorahexidine gluconate) soap before surgery.  CHG is an antiseptic cleaner which kills germs and bonds with the skin to continue killing  germs even after washing. Please DO NOT use if you have an allergy to CHG or antibacterial soaps.  If your skin becomes reddened/irritated stop using the CHG and inform your nurse when you arrive at Short Stay. Do not shave (including legs and underarms) for at least 48 hours prior to the first CHG shower.  You may shave your face/neck. Please follow these instructions carefully:  1.  Shower with CHG Soap the night before surgery and the  morning of Surgery.  2.  If you choose to wash your hair, wash your hair first as usual with your  normal  shampoo.  3.  After you shampoo, rinse your hair and body thoroughly to remove the  shampoo.                           4.  Use CHG as you would any other liquid soap.  You can apply chg directly  to the skin and wash                       Gently with a scrungie or clean washcloth.  5.  Apply the CHG Soap to your body ONLY FROM THE NECK DOWN.   Do not use on face/ open  Wound or open sores. Avoid contact with eyes, ears mouth and genitals (private parts).                       Wash face,  Genitals (private parts) with your normal soap.             6.  Wash thoroughly, paying special attention to the area where your surgery  will be performed.  7.  Thoroughly rinse your body with warm water from the neck down.  8.  DO NOT shower/wash with your normal soap after using and rinsing off  the CHG Soap.                9.  Pat yourself dry with a clean towel.            10.  Wear clean pajamas.            11.  Place clean sheets on your bed the night of your first shower and do not  sleep with pets. Day of Surgery : Do not apply any lotions/deodorants the morning of surgery.  Please wear clean clothes to the hospital/surgery center.  FAILURE TO FOLLOW THESE INSTRUCTIONS MAY RESULT IN THE CANCELLATION OF YOUR SURGERY PATIENT SIGNATURE_________________________________  NURSE  SIGNATURE__________________________________  ________________________________________________________________________

## 2018-04-30 NOTE — Progress Notes (Signed)
Patient Care Team: Tsosie Billing, MD as PCP - General (Internal Medicine)  DIAGNOSIS:    ICD-10-CM   1. Malignant neoplasm of upper-outer quadrant of right breast in female, estrogen receptor positive ([place]) C50.411    Z17.0     SUMMARY OF ONCOLOGIC HISTORY:   Malignant neoplasm of upper-outer quadrant of right breast in female, estrogen receptor positive ([place])   09/03/2017 Initial Diagnosis    Screening mammogram detected 1.4 cm right breast mass at 10 o'clock position, additional pleomorphic calcifications 2 cm posteriorly, additional loosely grouped calcifications 10.1 x 7.1 x 5.3 cm, single right axillary lymph node: Biopsy revealed DCIS ER 100%, PR 50%; IDC grade 2-3 ER 100%, PR 50%, Ki-67 15%, HER-2 positive ratio 2, copy #5, lymph node negative T2 N0 stage Ia clinical stage AJCC 8    09/03/2017 Cancer Staging    Staging form: Breast, AJCC 8th Edition - Clinical stage from 09/03/2017: Stage IB (cT2, cN0, cM0, G3, ER+, PR+, HER2+) - Signed by Gardenia Phlegm, NP on 04/10/2018    11/27/2017 Surgery    Right mastectomy: IDC grade 2, 1.6 cm, separate foci of DCIS intermediate grade, margins negative, 0/5 lymph nodes negative,ER 100%, PR 50%, Ki-67 15%, HER-2 positive ratio 2, copy #5 T1c N0 stage Ia    11/27/2017 Cancer Staging    Staging form: Breast, AJCC 8th Edition - Pathologic stage from 11/27/2017: Stage IA (pT1c, pN0, cM0, G2, ER+, PR+, HER2+) - Signed by Gardenia Phlegm, NP on 04/10/2018    02/07/2018 -  Chemotherapy    The patient had trastuzumab (HERCEPTIN) 483 mg in sodium chloride 0.9 % 250 mL chemo infusion, 8 mg/kg = 483 mg, Intravenous,  Once, 5 of 18 cycles Administration: 483 mg (02/07/2018), 357 mg (02/27/2018), 357 mg (03/20/2018), 357 mg (04/10/2018)  for chemotherapy treatment.     02/27/2018 -  Anti-estrogen oral therapy    Anastrozole 62m daily     CHIEF COMPLIANT: Follow-up of anastrozole and Herceptin  INTERVAL HISTORY: EDharma [name] a 68y.o. with above-mentioned history of right breast cancer treated with mastectomy who is currently receiving adjuvant chemotherapy with Herceptin and is on antiestrogen therapy with anastrozole. Her most recent bone density scan on 04/16/18 showed a T-score of -0.8. She presents to the clinic alone today and is tolerating treatment well. She notes fatigue and nausea, for which she has taken medication with improvement, in addition to pain at her port site and diarrhea, which was present before treatment. She notes occasional pain at her mastectomy site. She will have a revision of her port done by Dr. HExcell Seltzeron 05/07/18.   REVIEW OF SYSTEMS:   Constitutional: Denies fevers, chills or abnormal weight loss (+) fatigue Eyes: Denies blurriness of vision Ears, nose, mouth, throat, and face: Denies mucositis or sore throat Respiratory: Denies cough, dyspnea or wheezes Cardiovascular: Denies palpitation, chest discomfort Gastrointestinal: Denies heartburn (+) nausea (+) diarrhea Skin: Denies abnormal skin rashes Lymphatics: Denies new lymphadenopathy or easy bruising Neurological: Denies numbness, tingling or new weaknesses Behavioral/Psych: Mood is stable, no new changes  Extremities: No lower extremity edema Breast: denies any lumps or nodules in either breasts (+) pain at port site (+) occasional right chest pain All other systems were reviewed with the patient and are negative.  I have reviewed the past medical history, past surgical history, social history and family history with the patient and they are unchanged from previous note.  ALLERGIES:  has No Known Allergies.  MEDICATIONS:  Current Outpatient Medications  Medication Sig Dispense Refill  . albuterol (PROVENTIL) (2.5 MG/3ML) 0.083% nebulizer solution Take 2.5 mg by nebulization every 6 (six) hours as needed for wheezing or shortness of breath.     . ALPRAZolam (XANAX) 0.25 MG tablet Take 0.25 mg by mouth 2 (two) times  daily.     Marland Kitchen amLODipine (NORVASC) 5 MG tablet Take 5 mg by mouth daily.    Marland Kitchen anastrozole (ARIMIDEX) 1 MG tablet Take 1 tablet (1 mg total) by mouth daily. 90 tablet 3  . aspirin EC 81 MG EC tablet Take 1 tablet (81 mg total) by mouth daily. 30 tablet 0  . atorvastatin (LIPITOR) 20 MG tablet Take 20 mg by mouth daily.    . budesonide-formoterol (SYMBICORT) 160-4.5 MCG/ACT inhaler Inhale 1 puff into the lungs 2 (two) times daily.     . feeding supplement, ENSURE ENLIVE, (ENSURE ENLIVE) LIQD Take 237 mLs by mouth 2 (two) times daily between meals. 60 Bottle 0  . HYDROcodone-acetaminophen (NORCO/VICODIN) 5-325 MG tablet Take 1 tablet by mouth every 4 (four) hours as needed. (Patient taking differently: Take 1 tablet by mouth every 4 (four) hours as needed for moderate pain. ) 10 tablet 0  . lidocaine-prilocaine (EMLA) cream Apply to affected area once (Patient not taking: Reported on 04/10/2018) 30 g 3  . loperamide (IMODIUM) 2 MG capsule Take 1 capsule (2 mg total) by mouth 4 (four) times daily as needed for diarrhea or loose stools. 12 capsule 0  . metFORMIN (GLUCOPHAGE) 1000 MG tablet Take 500 mg by mouth 2 (two) times daily with a meal.     . metoprolol succinate (TOPROL-XL) 50 MG 24 hr tablet Take 50 mg by mouth at bedtime. Take with or immediately following a meal.    . nitroGLYCERIN (NITROSTAT) 0.4 MG SL tablet Place 1 tablet (0.4 mg total) under the tongue every 5 (five) minutes as needed. For chest pain (Patient taking differently: Place 0.4 mg under the tongue every 5 (five) minutes as needed for chest pain. ) 10 tablet 0  . Omega-3 Fatty Acids (FISH OIL) 1000 MG CAPS Take 1,000 mg by mouth daily.    . ondansetron (ZOFRAN) 8 MG tablet Take 1 tablet (8 mg total) by mouth every 8 (eight) hours as needed for nausea or vomiting. 20 tablet 0  . pantoprazole (PROTONIX) 40 MG tablet Take 1 tablet (40 mg total) by mouth daily. 60 tablet 0   No current facility-administered medications for this visit.     Facility-Administered Medications Ordered in Other Visits  Medication Dose Route Frequency Provider Last Rate Last Dose  . 0.9 %  sodium chloride infusion   Intravenous Once Nicholas Lose, MD      . acetaminophen (TYLENOL) tablet 650 mg  650 mg Oral Once Nicholas Lose, MD      . diphenhydrAMINE (BENADRYL) capsule 50 mg  50 mg Oral Once Nicholas Lose, MD      . trastuzumab (HERCEPTIN) 357 mg in sodium chloride 0.9 % 250 mL chemo infusion  6 mg/kg (Treatment Plan Recorded) Intravenous Once Nicholas Lose, MD        PHYSICAL EXAMINATION: ECOG PERFORMANCE STATUS: 1 - Symptomatic but completely ambulatory  Vitals:   05/01/18 1412  BP: 126/72  Pulse: 69  Resp: 17  Temp: 98.3 F (36.8 C)  SpO2: 100%   Filed Weights   05/01/18 1412  Weight: 125 lb 8 oz (56.9 kg)    GENERAL: alert, no distress and comfortable SKIN: skin color, texture,  turgor are normal, no rashes or significant lesions EYES: normal, Conjunctiva are pink and non-injected, sclera clear OROPHARYNX: no exudate, no erythema and lips, buccal mucosa, and tongue normal  NECK: supple, thyroid normal size, non-tender, without nodularity LYMPH: no palpable lymphadenopathy in the cervical, axillary or inguinal LUNGS: clear to auscultation and percussion with normal breathing effort HEART: regular rate & rhythm and no murmurs and no lower extremity edema ABDOMEN: abdomen soft, non-tender and normal bowel sounds MUSCULOSKELETAL: no cyanosis of digits and no clubbing  NEURO: alert & oriented x 3 with fluent speech, no focal motor/sensory deficits EXTREMITIES: No lower extremity edema  LABORATORY DATA:  I have reviewed the data as listed CMP Latest Ref Rng & Units 05/01/2018 04/10/2018 03/22/2018  Glucose 70 - 99 mg/dL 89 63(L) 90  BUN 8 - 23 mg/dL _0 Creatinine 0.44 - 1.00 mg/dL 1.75(H) 1.94(H) 1.56(H)  Sodium 135 - 145 mmol/L 139 139 138  Potassium 3.5 - 5.1 mmol/L 4.9 4.7 4.0  Chloride 98 - 111 mmol/L 105 107 105  CO2  22 - 32 mmol/L _1 Calcium 8.9 - 10.3 mg/dL 9.5 9.2 9.6  Total Protein 6.5 - 8.1 g/dL 9.0(H) 8.7(H) 8.7(H)  Total Bilirubin 0.3 - 1.2 mg/dL 0.7 0.6 0.7  Alkaline Phos 38 - 126 U/L 146(H) 121 112  AST 15 - 41 U/L 12(L) 9(L) 15  ALT 0 - 44 U/L _2 Lab Results  Component Value Date   WBC 8.1 05/01/2018   HGB 8.9 (L) 05/01/2018   HCT 32.0 (L) 05/01/2018   MCV 80.4 05/01/2018   PLT 432 (H) 05/01/2018   NEUTROABS 4.9 05/01/2018    ASSESSMENT & PLAN:  Malignant neoplasm of upper-outer quadrant of right breast in female, estrogen receptor positive ([place]) 11/27/2017 right mastectomy: IDC grade 2, 1.6 cm, separate foci of DCIS intermediate grade, margins negative, 0/5 lymph nodes negative,ER 100%, PR 50%, Ki-67 15%, HER-2 positive ratio 2, copy #5 T1c N0 stage Ia  Treatment plan: 1.  Adjuvant Herceptin every 3 weeks for 1 year (because of poor performance status Taxol is felt to be intolerable for her) 2.  Adjuvant antiestrogen therapy with letrozole 2.5 mg daily  Patient's daughter is the primary point of contact for her and all appointment changes will need to go through her. -------------------------------------------------------------------------------------- Current treatment: Herceptin with anastrozole started 02/07/2018 Echo 02/17/2018 EF 55-60%--followed by Bensimhon  Tolerating anastrozole and Herceptin well.  Will continue this.  Severe anemia: Given PRBCs.  Return to clinic in 3 weeks for labs, f/u and Herceptin.     No orders of the defined types were placed in this encounter.  The patient has a good understanding of the overall plan. she agrees with it. she will call with any problems that may develop before the next visit here.  Nicholas Lose, MD 05/01/2018  Julious Oka Dorshimer am acting as scribe for Dr. Nicholas Lose.  I have reviewed the above documentation for accuracy and completeness, and I agree with the above.

## 2018-04-30 NOTE — Progress Notes (Signed)
CHEST XRAY 1 VIEW 04-10-2018 Epic EKG 03-22-2018 EPIC

## 2018-05-01 ENCOUNTER — Inpatient Hospital Stay: Payer: Medicare Other

## 2018-05-01 ENCOUNTER — Encounter: Payer: Self-pay | Admitting: Adult Health

## 2018-05-01 ENCOUNTER — Inpatient Hospital Stay (HOSPITAL_BASED_OUTPATIENT_CLINIC_OR_DEPARTMENT_OTHER): Payer: Medicare Other | Admitting: Hematology and Oncology

## 2018-05-01 ENCOUNTER — Ambulatory Visit: Payer: Self-pay

## 2018-05-01 ENCOUNTER — Other Ambulatory Visit: Payer: Self-pay

## 2018-05-01 DIAGNOSIS — C50411 Malignant neoplasm of upper-outer quadrant of right female breast: Secondary | ICD-10-CM

## 2018-05-01 DIAGNOSIS — D508 Other iron deficiency anemias: Secondary | ICD-10-CM

## 2018-05-01 DIAGNOSIS — Z9011 Acquired absence of right breast and nipple: Secondary | ICD-10-CM

## 2018-05-01 DIAGNOSIS — Z170 Estrogen receptor positive status [ER+]: Secondary | ICD-10-CM | POA: Diagnosis not present

## 2018-05-01 DIAGNOSIS — Z79811 Long term (current) use of aromatase inhibitors: Secondary | ICD-10-CM

## 2018-05-01 DIAGNOSIS — D649 Anemia, unspecified: Secondary | ICD-10-CM | POA: Diagnosis not present

## 2018-05-01 DIAGNOSIS — Z5112 Encounter for antineoplastic immunotherapy: Secondary | ICD-10-CM | POA: Diagnosis not present

## 2018-05-01 LAB — IRON AND TIBC
Iron: 26 ug/dL — ABNORMAL LOW (ref 41–142)
SATURATION RATIOS: 8 % — AB (ref 21–57)
TIBC: 331 ug/dL (ref 236–444)
UIBC: 305 ug/dL (ref 120–384)

## 2018-05-01 LAB — CMP (CANCER CENTER ONLY)
ALBUMIN: 3.5 g/dL (ref 3.5–5.0)
ALT: 11 U/L (ref 0–44)
ANION GAP: 9 (ref 5–15)
AST: 12 U/L — ABNORMAL LOW (ref 15–41)
Alkaline Phosphatase: 146 U/L — ABNORMAL HIGH (ref 38–126)
BUN: 21 mg/dL (ref 8–23)
CO2: 25 mmol/L (ref 22–32)
Calcium: 9.5 mg/dL (ref 8.9–10.3)
Chloride: 105 mmol/L (ref 98–111)
Creatinine: 1.75 mg/dL — ABNORMAL HIGH (ref 0.44–1.00)
GFR, Est AFR Am: 34 mL/min — ABNORMAL LOW (ref >60)
GFR, Estimated: 29 mL/min — ABNORMAL LOW (ref >60)
Glucose, Bld: 89 mg/dL (ref 70–99)
Potassium: 4.9 mmol/L (ref 3.5–5.1)
Sodium: 139 mmol/L (ref 135–145)
Total Bilirubin: 0.7 mg/dL (ref 0.3–1.2)
Total Protein: 9 g/dL — ABNORMAL HIGH (ref 6.5–8.1)

## 2018-05-01 LAB — CBC WITH DIFFERENTIAL (CANCER CENTER ONLY)
Abs Immature Granulocytes: 0.04 10*3/uL (ref 0.00–0.07)
BASOS PCT: 0 %
Basophils Absolute: 0 10*3/uL (ref 0.0–0.1)
Eosinophils Absolute: 0.3 10*3/uL (ref 0.0–0.5)
Eosinophils Relative: 3 %
HCT: 32 % — ABNORMAL LOW (ref 36.0–46.0)
Hemoglobin: 8.9 g/dL — ABNORMAL LOW (ref 12.0–15.0)
IMMATURE GRANULOCYTES: 1 %
Lymphocytes Relative: 25 %
Lymphs Abs: 2.1 10*3/uL (ref 0.7–4.0)
MCH: 22.4 pg — AB (ref 26.0–34.0)
MCHC: 27.8 g/dL — ABNORMAL LOW (ref 30.0–36.0)
MCV: 80.4 fL (ref 80.0–100.0)
MONOS PCT: 10 %
Monocytes Absolute: 0.8 10*3/uL (ref 0.1–1.0)
NEUTROS ABS: 4.9 10*3/uL (ref 1.7–7.7)
NEUTROS PCT: 61 %
Platelet Count: 432 10*3/uL — ABNORMAL HIGH (ref 150–400)
RBC: 3.98 MIL/uL (ref 3.87–5.11)
RDW: 19 % — ABNORMAL HIGH (ref 11.5–15.5)
WBC Count: 8.1 10*3/uL (ref 4.0–10.5)
nRBC: 0 % (ref 0.0–0.2)

## 2018-05-01 LAB — FERRITIN: Ferritin: 10 ng/mL — ABNORMAL LOW (ref 11–307)

## 2018-05-01 MED ORDER — DIPHENHYDRAMINE HCL 25 MG PO CAPS
50.0000 mg | ORAL_CAPSULE | Freq: Once | ORAL | Status: AC
  Administered 2018-05-01: 50 mg via ORAL

## 2018-05-01 MED ORDER — SODIUM CHLORIDE 0.9 % IV SOLN
Freq: Once | INTRAVENOUS | Status: AC
  Administered 2018-05-01: 15:00:00 via INTRAVENOUS
  Filled 2018-05-01: qty 250

## 2018-05-01 MED ORDER — TRASTUZUMAB CHEMO 150 MG IV SOLR
6.0000 mg/kg | Freq: Once | INTRAVENOUS | Status: AC
  Administered 2018-05-01: 357 mg via INTRAVENOUS
  Filled 2018-05-01: qty 17

## 2018-05-01 MED ORDER — DIPHENHYDRAMINE HCL 25 MG PO CAPS
ORAL_CAPSULE | ORAL | Status: AC
  Filled 2018-05-01: qty 2

## 2018-05-01 MED ORDER — ACETAMINOPHEN 325 MG PO TABS
650.0000 mg | ORAL_TABLET | Freq: Once | ORAL | Status: AC
  Administered 2018-05-01: 650 mg via ORAL

## 2018-05-01 MED ORDER — ACETAMINOPHEN 325 MG PO TABS
ORAL_TABLET | ORAL | Status: AC
  Filled 2018-05-01: qty 2

## 2018-05-01 NOTE — Assessment & Plan Note (Addendum)
11/27/2017 right mastectomy: IDC grade 2, 1.6 cm, separate foci of DCIS intermediate grade, margins negative, 0/5 lymph nodes negative,ER 100%, PR 50%, Ki-67 15%, HER-2 positive ratio 2, copy #5 T1c N0 stage Ia  Treatment plan: 1.  Adjuvant Herceptin every 3 weeks for 1 year (because of poor performance status Taxol is felt to be intolerable for her) 2.  Adjuvant antiestrogen therapy with letrozole 2.5 mg daily  Patient's daughter is the primary point of contact for her and all appointment changes will need to go through her. -------------------------------------------------------------------------------------- Current treatment: Herceptin with anastrozole started 02/07/2018 Echo 02/17/2018 EF 55-60%--followed by Bensimhon  Tolerating anastrozole and Herceptin well.  Will continue this.  Severe anemia: Given PRBCs.  Return to clinic in 3 weeks for labs, f/u and Herceptin.

## 2018-05-01 NOTE — Patient Instructions (Signed)
[facility] Discharge Instructions for Patients Receiving Chemotherapy Today you received the following chemotherapy agents:  Herceptin To help prevent nausea and vomiting after your treatment, we encourage you to take your nausea medication as prescribed.   If you develop nausea and vomiting that is not controlled by your nausea medication, call the clinic.   BELOW ARE SYMPTOMS THAT SHOULD BE REPORTED IMMEDIATELY:  *FEVER GREATER THAN 100.5 F  *CHILLS WITH OR WITHOUT FEVER  NAUSEA AND VOMITING THAT IS NOT CONTROLLED WITH YOUR NAUSEA MEDICATION  *UNUSUAL SHORTNESS OF BREATH  *UNUSUAL BRUISING OR BLEEDING  TENDERNESS IN MOUTH AND THROAT WITH OR WITHOUT PRESENCE OF ULCERS  *URINARY PROBLEMS  *BOWEL PROBLEMS  UNUSUAL RASH Items with * indicate a potential emergency and should be followed up as soon as possible.  Feel free to call the clinic should you have any questions or concerns. The clinic phone number is [phone].  Please show the CHEMO ALERT CARD at check-in to the Emergency Department and triage nurse.   

## 2018-05-02 ENCOUNTER — Other Ambulatory Visit: Payer: Self-pay | Admitting: Adult Health

## 2018-05-02 ENCOUNTER — Telehealth: Payer: Self-pay

## 2018-05-02 NOTE — Progress Notes (Signed)
Asked Covid 19 questions.  Patient states she has a "little runny nose " which is normal for her. Patient reports she uses inhalers but nothing has changed about her breathing status.  Reported to Konrad Felix, [facility]

## 2018-05-02 NOTE — Patient Instructions (Addendum)
[name]  05/02/2018   Your procedure is scheduled on: 05/07/2018   Report to [facility] Main  Entrance  Report to admitting at    0630 AM  Nothing to eat or drink after midnite the nite before surgery.    Call this number if you have problems the morning of surgery [phone]   Remember: Do not eat food or drink liquids :After Midnight. BRUSH YOUR TEETH MORNING OF SURGERY AND RINSE YOUR MOUTH OUT, NO CHEWING GUM CANDY OR MINTS.     Take these medicines the morning of surgery with A SIP OF WATER: Hydrocodone as needed, alprazolam, albuterol neb if needed , symbicort and bring,  arimidex, atorvastatin, amlodopine, metoprolol, protonix  DO NOT TAKE ANY DIABETIC MEDICATIONS DAY OF YOUR SURGERY                               You may not have any metal on your body including hair pins and              piercings  Do not wear jewelry, make-up, lotions, powders or perfumes, deodorant             Do not wear nail polish.  Do not shave  48 hours prior to surgery.         Do not bring valuables to the hospital. [place].  Contacts, dentures or bridgework may not be worn into surgery.      Patients discharged the day of surgery will not be allowed to drive home. IF YOU ARE HAVING SURGERY AND GOING HOME THE SAME DAY, YOU MUST HAVE AN ADULT TO DRIVE YOU HOME AND BE WITH YOU FOR 24 HOURS. YOU MAY GO HOME BY TAXI OR UBER OR ORTHERWISE, BUT AN ADULT MUST ACCOMPANY YOU HOME AND STAY WITH YOU FOR 24 HOURS.  Name and phone number of your driver:               Please read over the following fact sheets you were given: _____________________________________________________________________             [facility] - Preparing for Surgery Before surgery, you can play an important role.  Because skin is not sterile, your skin needs to be as free of germs as possible.  You can reduce the number of germs on your skin by  washing with CHG (chlorahexidine gluconate) soap before surgery.  CHG is an antiseptic cleaner which kills germs and bonds with the skin to continue killing germs even after washing. Please DO NOT use if you have an allergy to CHG or antibacterial soaps.  If your skin becomes reddened/irritated stop using the CHG and inform your nurse when you arrive at Short Stay. Do not shave (including legs and underarms) for at least 48 hours prior to the first CHG shower.  You may shave your face/neck. Please follow these instructions carefully:  1.  Shower with CHG Soap the night before surgery and the  morning of Surgery.  2.  If you choose to wash your hair, wash your hair first as usual with your  normal  shampoo.  3.  After you shampoo, rinse your hair and body thoroughly to remove the  shampoo.  4.  Use CHG as you would any other liquid soap.  You can apply chg directly  to the skin and wash                       Gently with a scrungie or clean washcloth.  5.  Apply the CHG Soap to your body ONLY FROM THE NECK DOWN.   Do not use on face/ open                           Wound or open sores. Avoid contact with eyes, ears mouth and genitals (private parts).                       Wash face,  Genitals (private parts) with your normal soap.             6.  Wash thoroughly, paying special attention to the area where your surgery  will be performed.  7.  Thoroughly rinse your body with warm water from the neck down.  8.  DO NOT shower/wash with your normal soap after using and rinsing off  the CHG Soap.                9.  Pat yourself dry with a clean towel.            10.  Wear clean pajamas.            11.  Place clean sheets on your bed the night of your first shower and do not  sleep with pets. Day of Surgery : Do not apply any lotions/deodorants the morning of surgery.  Please wear clean clothes to the hospital/surgery center.  FAILURE TO FOLLOW THESE INSTRUCTIONS MAY RESULT IN THE  CANCELLATION OF YOUR SURGERY PATIENT SIGNATURE_________________________________  NURSE SIGNATURE__________________________________  ________________________________________________________________________

## 2018-05-02 NOTE — Telephone Encounter (Signed)
-----   Message from Gardenia Phlegm, NP sent at 05/02/2018 12:34 PM EDT ----- Please call patient and let her know that her iron is low.  Please let her know that she will receive IV iron in addition to herceptin on 4/16 and iv iron on 4/23. She needs to see GI, but [facility] has not been able to get in touch with her.  She should call them back.  Thanks,  Mendel Ryder ----- Message ----- From: Nicholas Lose, MD Sent: 05/02/2018  11:36 AM EDT To: Gardenia Phlegm, NP  Yes Please order Thanks Vinay ----- Message ----- From: Gardenia Phlegm, NP Sent: 05/02/2018   8:39 AM EDT To: Nicholas Lose, MD  Want me to order IV iron with her next treatment on 4/16?  [facility] has not been able to get in touch with her. ----- Message ----- From: Buel Ream, [facility] In [place] Sent: 05/01/2018   3:10 PM EDT To: Gardenia Phlegm, NP

## 2018-05-02 NOTE — Telephone Encounter (Signed)
Spoke with patient to inform her that labs show her iron is low.  Explained that IV iron would be added to her next infusions on 4/16 and 4/23, per NP.  Patient was able to voice understanding after additional explanation.  Informed patient that [facility] has been trying to contact her but denies getting any any calls.  Nurse encouraged patient to get in touch with them.

## 2018-05-05 ENCOUNTER — Encounter (HOSPITAL_COMMUNITY)
Admission: RE | Admit: 2018-05-05 | Discharge: 2018-05-05 | Disposition: A | Discharge status: Home / Self Care | Payer: Medicare Other | Source: Ambulatory Visit | Attending: General Surgery | Admitting: General Surgery

## 2018-05-05 ENCOUNTER — Other Ambulatory Visit: Payer: Self-pay

## 2018-05-05 ENCOUNTER — Encounter (HOSPITAL_COMMUNITY): Payer: Self-pay

## 2018-05-05 DIAGNOSIS — K219 Gastro-esophageal reflux disease without esophagitis: Secondary | ICD-10-CM | POA: Insufficient documentation

## 2018-05-05 DIAGNOSIS — Z01812 Encounter for preprocedural laboratory examination: Secondary | ICD-10-CM

## 2018-05-05 DIAGNOSIS — M19049 Primary osteoarthritis, unspecified hand: Secondary | ICD-10-CM

## 2018-05-05 DIAGNOSIS — J45909 Unspecified asthma, uncomplicated: Secondary | ICD-10-CM

## 2018-05-05 DIAGNOSIS — T85628A Displacement of other specified internal prosthetic devices, implants and grafts, initial encounter: Secondary | ICD-10-CM | POA: Diagnosis present

## 2018-05-05 DIAGNOSIS — G5600 Carpal tunnel syndrome, unspecified upper limb: Secondary | ICD-10-CM | POA: Diagnosis not present

## 2018-05-05 DIAGNOSIS — I2510 Atherosclerotic heart disease of native coronary artery without angina pectoris: Secondary | ICD-10-CM

## 2018-05-05 DIAGNOSIS — K760 Fatty (change of) liver, not elsewhere classified: Secondary | ICD-10-CM | POA: Diagnosis not present

## 2018-05-05 DIAGNOSIS — Z794 Long term (current) use of insulin: Secondary | ICD-10-CM | POA: Diagnosis not present

## 2018-05-05 DIAGNOSIS — Z79811 Long term (current) use of aromatase inhibitors: Secondary | ICD-10-CM | POA: Insufficient documentation

## 2018-05-05 DIAGNOSIS — Z9011 Acquired absence of right breast and nipple: Secondary | ICD-10-CM | POA: Diagnosis not present

## 2018-05-05 DIAGNOSIS — I252 Old myocardial infarction: Secondary | ICD-10-CM | POA: Insufficient documentation

## 2018-05-05 DIAGNOSIS — E785 Hyperlipidemia, unspecified: Secondary | ICD-10-CM | POA: Insufficient documentation

## 2018-05-05 DIAGNOSIS — Z87891 Personal history of nicotine dependence: Secondary | ICD-10-CM

## 2018-05-05 DIAGNOSIS — C50911 Malignant neoplasm of unspecified site of right female breast: Secondary | ICD-10-CM | POA: Insufficient documentation

## 2018-05-05 DIAGNOSIS — E119 Type 2 diabetes mellitus without complications: Secondary | ICD-10-CM

## 2018-05-05 DIAGNOSIS — C50411 Malignant neoplasm of upper-outer quadrant of right female breast: Secondary | ICD-10-CM | POA: Diagnosis not present

## 2018-05-05 DIAGNOSIS — I10 Essential (primary) hypertension: Secondary | ICD-10-CM

## 2018-05-05 DIAGNOSIS — X58XXXA Exposure to other specified factors, initial encounter: Secondary | ICD-10-CM | POA: Diagnosis not present

## 2018-05-05 DIAGNOSIS — Z79899 Other long term (current) drug therapy: Secondary | ICD-10-CM

## 2018-05-05 DIAGNOSIS — Z955 Presence of coronary angioplasty implant and graft: Secondary | ICD-10-CM | POA: Insufficient documentation

## 2018-05-05 DIAGNOSIS — Z7982 Long term (current) use of aspirin: Secondary | ICD-10-CM

## 2018-05-05 DIAGNOSIS — M1990 Unspecified osteoarthritis, unspecified site: Secondary | ICD-10-CM | POA: Diagnosis not present

## 2018-05-05 DIAGNOSIS — Z7984 Long term (current) use of oral hypoglycemic drugs: Secondary | ICD-10-CM | POA: Insufficient documentation

## 2018-05-05 DIAGNOSIS — Z886 Allergy status to analgesic agent status: Secondary | ICD-10-CM | POA: Diagnosis not present

## 2018-05-05 DIAGNOSIS — Z170 Estrogen receptor positive status [ER+]: Secondary | ICD-10-CM | POA: Diagnosis not present

## 2018-05-05 HISTORY — DX: Headache, unspecified: R51.9

## 2018-05-05 HISTORY — DX: Dyspnea, unspecified: R06.00

## 2018-05-05 HISTORY — DX: Headache: R51

## 2018-05-05 LAB — GLUCOSE, CAPILLARY: Glucose-Capillary: 100 mg/dL — ABNORMAL HIGH (ref 70–99)

## 2018-05-05 NOTE — Anesthesia Preprocedure Evaluation (Addendum)
Anesthesia Evaluation  Patient identified by MRN, date of birth, ID band Patient awake    Reviewed: Allergy & Precautions, NPO status , Patient's Chart, lab work & pertinent test results  Airway Mallampati: II  TM Distance: >3 FB Neck ROM: Full    Dental no notable dental hx.    Pulmonary shortness of breath, asthma , former smoker,    Pulmonary exam normal breath sounds clear to auscultation       Cardiovascular hypertension, Pt. on medications + angina + CAD, + Past MI and + DOE  Normal cardiovascular exam Rhythm:Regular Rate:Normal     Neuro/Psych  Headaches, Depression  Neuromuscular disease negative psych ROS   GI/Hepatic Neg liver ROS, GERD  ,  Endo/Other  negative endocrine ROSdiabetes  Renal/GU Renal disease  negative genitourinary   Musculoskeletal  (+) Arthritis , Osteoarthritis,    Abdominal   Peds negative pediatric ROS (+)  Hematology negative hematology ROS (+) anemia ,   Anesthesia Other Findings Breast Cancer  Reproductive/Obstetrics negative OB ROS                            Anesthesia Physical Anesthesia Plan  ASA: III  Anesthesia Plan: MAC   Post-op Pain Management:    Induction: Intravenous  PONV Risk Score and Plan: 2 and Ondansetron, Midazolam and Treatment may vary due to age or medical condition  Airway Management Planned: Simple Face Mask  Additional Equipment:   Intra-op Plan:   Post-operative Plan:   Informed Consent: I have reviewed the patients History and Physical, chart, labs and discussed the procedure including the risks, benefits and alternatives for the proposed anesthesia with the patient or authorized representative who has indicated his/her understanding and acceptance.     Dental advisory given  Plan Discussed with: CRNA  Anesthesia Plan Comments: (See PAT note 05/04/28, Konrad Felix, PA-C)       Anesthesia Quick  Evaluation

## 2018-05-05 NOTE — Progress Notes (Signed)
Patient denies any cough, runny nose, fever or sore throat at time of preop appt.  Patient does have chronic shortness of breath per patient which Konrad Felix, PA was made aware.  No new orders given.  Patient also had complaint of chest pain at time of preop which was determined to be atypical due to history and patient states she has had before due to dislocation of port per patient .  Patient has not traveled out of the state.

## 2018-05-05 NOTE — Progress Notes (Signed)
Audria Nine aware of labs done 05/01/18.  NO new lab orders at this time per Audria Nine

## 2018-05-05 NOTE — Progress Notes (Cosign Needed)
Anesthesia Chart Review   Case:  [id number] Date/Time:  05/07/18 1517   Procedure:  PORT A CATH REVISION (N/A )   Anesthesia type:  Monitor Anesthesia Care   Pre-op diagnosis:  DISPLACEMENT OF PORT-A-CATH   Location:  [place] [facility] / [facility]   Surgeon:  Excell Seltzer, MD      DISCUSSION: 68 yo former smoker (23.5 pack years, quit 05/09/10) with h/o HLD, CAD (MI, cardiac stents), DM II, GERD, HTN, nonalcoholic fatty liver disease, iron deficiency anemia (iron infusion planned along with herceptin 4/16 and 4/23), renal insufficiency (creatine 1.75 05/01/18 which appears to be stable), asthma, right breast cancer (currently receiving adjuvant chemo), displacement of port-a-cath scheduled for above procedure with Dr. Excell Seltzer.   Pt last seen by cardiology, Dr. Glori Bickers, on 02/17/18. Pt experiences atypical chest pain, most recent ischemic workup 04/2016 with no reversible ischemia or infarction.  Per Echo 02/17/18 EF 55-60%. She was seen in ED 03/22/18 complaining of chest pain, negative cardiac workup, negative for PE; gallstones on CT.   Pt can proceed with planned procedure barring acute status change.  VS: BP 122/74   Pulse 72   Temp 37.1 C (Oral)   Resp 18   Ht 4\' 11"  (1.499 m)   Wt 57.6 kg   SpO2 100%   BMI 25.65 kg/m   PROVIDERS: Tsosie Billing, MD is PCP   Nicholas Lose, MD with Hematology and Oncology   Glori Bickers, MD is Cardiologist  LABS: Labs reviewed: Acceptable for surgery. (all labs ordered are listed, but only abnormal results are displayed)  Labs Reviewed  GLUCOSE, CAPILLARY - Abnormal; Notable for the following components:      Result Value   Glucose-Capillary 100 (*)    All other components within normal limits     IMAGES: CT angio Chest PE 03/22/18 IMPRESSION: 1. No evidence of pulmonary embolism. 2. Diffuse BILATERAL LOWER airways disease as identified on prior examinations. Stable lingular scarring. No acute  cardiopulmonary disease. 3. Stable cardiomegaly.  Three-vessel coronary atherosclerosis. 4. 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 Recommend annual imaging followup by CTA or MRA. This recommendation follows 2010 ACCF/AHA/AATS/ACR/ASA/SCA/SCAI/SIR/STS/SVM Guidelines for the Diagnosis and Management of Patients with Thoracic Aortic Disease. Circulation.2010; 121: O160-V371. Aortic aneurysm NOS (ICD10-I71.9) 5. Cholelithiasis.  EKG: 03/22/18 Rate 72 bpm  Sinus rhythm   CV: Echo 02/17/18 Study Conclusions  - Left ventricle: The cavity size was normal. Wall thickness was   normal. Systolic function was normal. The estimated ejection   fraction was in the range of 55% to 60%. Wall motion was normal;   there were no regional wall motion abnormalities. Doppler   parameters are consistent with abnormal left ventricular   relaxation (grade 1 diastolic dysfunction). - Aortic valve: There was mild regurgitation. - Impressions: GLS -16.6%  Stress Test 04/20/16  IMPRESSION: 1. No reversible ischemia or infarction.  2. Normal left ventricular wall motion.  3. Left ventricular ejection fraction 54%  4. Non invasive risk stratification*: Low Past Medical History:  Diagnosis Date  . Angina   . Arthritis    HANDS"  . Asthma   . Cancer ([place]) 09/04/2017   Right Breast  . Carpal tunnel syndrome, bilateral 11/26/2016  . Cholelithiasis   . Cocaine abuse ([place]) 07/2012   per E.R. drug screen  . Coronary artery disease   . Diabetes mellitus without mention of complication    type 2  . Dysphagia  esophageal dysmotility on 03/2011 esophagram   . Fatty liver disease, nonalcoholic 3832   on Imaging.   Marland Kitchen GERD (gastroesophageal reflux disease)   . History of colonic polyps 2009   adenomatous 2009, HP in 2009, 2011, 2013.   Marland Kitchen Hyperlipidemia   . Hypertension   . Iron deficiency anemia   . Myocardial infarction ([place])    years ago, 6 stents  placed  . 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 ABDOMINAL ANGIOGRAM  02/20/2011   Procedure: ABDOMINAL ANGIOGRAM;  Surgeon: Clent Demark, MD;  Location: [facility] CATH LAB;  Service: Cardiovascular;;  . CARPAL TUNNEL RELEASE Right 07/04/2017   Procedure: RIGHT CARPAL TUNNEL RELEASE;  Surgeon: Leanora Cover, MD;  Location: [place];  Service: Orthopedics;  Laterality: Right;  . CARPAL TUNNEL RELEASE Left 09/12/2017   Procedure: LEFT CARPAL TUNNEL RELEASE;  Surgeon: Leanora Cover, MD;  Location: [place];  Service: Orthopedics;  Laterality: Left;  . COLONOSCOPY  11/30/2011   Procedure: COLONOSCOPY;  Surgeon: Lafayette Dragon, MD;  Location: [facility];  Service: Endoscopy;  Laterality: N/A;  . CORONARY ANGIOPLASTY WITH STENT PLACEMENT    . ESOPHAGOGASTRODUODENOSCOPY  11/30/2011   Procedure: ESOPHAGOGASTRODUODENOSCOPY (EGD);  Surgeon: Lafayette Dragon, MD;  Location: [facility];  Service: Endoscopy;  Laterality: N/A;  . ESOPHAGOGASTRODUODENOSCOPY N/A 02/23/2015   Procedure: ESOPHAGOGASTRODUODENOSCOPY (EGD);  Surgeon: Gatha Mayer, MD;  Location: [facility];  Service: Endoscopy;  Laterality: N/A;  . LEFT HEART CATHETERIZATION WITH CORONARY ANGIOGRAM N/A 02/20/2011   Procedure: LEFT HEART CATHETERIZATION WITH CORONARY ANGIOGRAM;  Surgeon: Clent Demark, MD;  Location: [facility] CATH LAB;  Service: Cardiovascular;  Laterality: N/A;  . MASTECTOMY Right 11/05/2017  . MASTECTOMY W/ SENTINEL NODE BIOPSY Right 11/27/2017  . MASTECTOMY W/ SENTINEL NODE BIOPSY Right 11/27/2017   Procedure: RIGHT TOTAL MASTECTOMY WITH SENTINEL LYMPH NODE BIOPSY;  Surgeon: Excell Seltzer, MD;  Location: [place];  Service: General;  Laterality: Right;  . PORTACATH PLACEMENT Left 11/27/2017   Procedure: INSERTION PORT-A-CATH;  Surgeon: Excell Seltzer, MD;  Location: [place];  Service: General;  Laterality: Left;  . RIGHT COLECTOMY  02/2007    for post polypectomy colonic perforation  . TUBAL LIGATION      MEDICATIONS: . albuterol (PROVENTIL) (2.5 MG/3ML) 0.083% nebulizer solution  . ALPRAZolam (XANAX) 0.25 MG tablet  . amLODipine (NORVASC) 5 MG tablet  . anastrozole (ARIMIDEX) 1 MG tablet  . aspirin EC 81 MG EC tablet  . atorvastatin (LIPITOR) 20 MG tablet  . budesonide-formoterol (SYMBICORT) 160-4.5 MCG/ACT inhaler  . feeding supplement, ENSURE ENLIVE, (ENSURE ENLIVE) LIQD  . HYDROcodone-acetaminophen (NORCO/VICODIN) 5-325 MG tablet  . lidocaine-prilocaine (EMLA) cream  . loperamide (IMODIUM) 2 MG capsule  . metFORMIN (GLUCOPHAGE) 1000 MG tablet  . metoprolol succinate (TOPROL-XL) 50 MG 24 hr tablet  . nitroGLYCERIN (NITROSTAT) 0.4 MG SL tablet  . Omega-3 Fatty Acids (FISH OIL) 1000 MG CAPS  . ondansetron (ZOFRAN) 8 MG tablet  . pantoprazole (PROTONIX) 40 MG tablet   No current facility-administered medications for this encounter.     Maia Plan [facility] Pre-Surgical Testing [phone] 05/05/18 1:34 PM

## 2018-05-06 NOTE — H&P (Signed)
History of Present Illness  The patient is a 68 year old female who presents with breast cancer. Patient returns for follow-up status post right total mastectomy and axillary sentinel lymph node biopsy, Placement of Port-A-Cath.. Her original presentation was as follows:  She is a post menopausal female referred by Dr. Ammie Ferrier for evaluation of recently diagnosed carcinoma of the right breast. She recently presented for a screening mamogram revealing abnormal calcifications and masses in the right breast. Subsequent imaging included diagnostic mamogram showing several findings including an irregular mass with indistinct margins containing microcalcifications in the upper outer right breast anteriorly, and indistinct oval mass density in the more posterior upper outer right breast 9 o'clock position containing calcifications and a group of pleomorphic calcifications in the posterior aspect upper outer right breast spanning 2 cm. Also noted were loosely grouped and scattered calcifications encompassing all of these areas and together spanning over 10 cm and ultrasound showing a 1.4 cm irregular mass 10 o'clock position right breast, 6 mm more laterally and superiorly a 5 mm indistinct hypoechoic area and no ultrasound correlate for the more posterior masslike density. Axillary ultrasound showed a single node with cortical thickening. 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 Biopsy was performed of the abnormal appearing lymph node which was benign and concordant. The wider more loosely grouped calcifications in the posterior indistinct mass were not biopsied. She is seen now in the office for initial treatment planning. She has  experienced no breast symptoms, specifically lump or pain, skin changes or nipple discharge. She does not have a personal history of any previous breast problems.  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  She had been having some pain occasionally in her chest related to infusions.  She has had follow-up x-rays showing the tip of the Port-A-Cath has pulled back slightly and appears to be at the junction of the brachiocephalic vein and SVC but not clearly down into the SVC.  With this finding and her symptoms we have recommended revision of her Port-A-Cath to advance the tip distally.  Past Medical History:  Diagnosis Date  . Angina   . Arthritis    HANDS"  . Asthma   . Cancer ([place]) 09/04/2017   Right Breast  . Carpal tunnel syndrome, bilateral 11/26/2016  . Cholelithiasis   . Cocaine abuse ([place]) 07/2012   per E.R. drug screen  . Coronary artery disease   . Diabetes mellitus without mention of complication    type 2  . Dysphagia    esophageal dysmotility on 03/2011 esophagram   . Dyspnea    ongoing   . Fatty liver disease, nonalcoholic 2841   on Imaging.   Marland Kitchen GERD (gastroesophageal reflux disease)   . Headache   . History of colonic polyps 2009   adenomatous 2009, HP in 2009, 2011, 2013.   Marland Kitchen Hyperlipidemia   . Hypertension   . Iron deficiency anemia   . Myocardial infarction (  [place])    years ago, 6 stents placed  . 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 Xanax (0.25MG Tablet, Oral) Active. AmLODIPine Besylate (5MG Tablet, Oral) Active. Aspirin (81MG Tablet DR, Oral) Active. Lipitor (20MG Tablet, Oral) Active. Symbicort (160-4.5MCG/ACT Aerosol, Inhalation) Active. Flexeril (10MG Tablet, Oral) Active. Lantus SoloStar (100UNIT/ML Soln Pen-inj, Subcutaneous) Active. MetFORMIN HCl ER (MOD) (1000MG Tablet ER 24HR, Oral) Active. Nitrostat (0.4MG Tab Sublingual, Sublingual) Active. Protonix (40MG Tablet DR, Oral) Active. Metoprolol Succinate ER (50MG Tablet ER 24HR, Oral) Active. Medications Reconciled  Vitals  11/21AM)/2019 10:27 AM Weight: 127.13 lb Height: 59in Body Surface Area: 1.52 m Body Mass Index: 25.68 kg/m  Temp.: 97.47F  Pulse: 92 (Regular)  BP: 140/78 (Sitting, Left Arm, Standard)        Physical Exam The physical exam findings are as follows: Note:General: Alert no distress Chest wall: Right mastectomy wound healing nicely without apparent complicating factors.  Port-A-Cath site left anterior chest wall unremarkable. Lungs: Clear easy respirations Cardiac: Regular rate and rhythm Neurologic: Alert and fully oriented.  Affect normal.   Assessment & Plan Marland Kitchen T. 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 One micrometastasis in 6 lymph nodes. Plan for IV Herceptin  Displaced Port-A-Cath with catheter tip at brachiocephalic/SVC junction.  Due to concern with this finding in relation to some chest symptoms we will plan revision of her Port-A-Cath with advancement of the tip.  Plan local anesthesia with sedation.

## 2018-05-07 ENCOUNTER — Ambulatory Visit (HOSPITAL_COMMUNITY): Payer: Medicare Other | Admitting: Physician Assistant

## 2018-05-07 ENCOUNTER — Ambulatory Visit (HOSPITAL_COMMUNITY): Payer: Medicare Other

## 2018-05-07 ENCOUNTER — Encounter (HOSPITAL_COMMUNITY): Payer: Self-pay | Admitting: Emergency Medicine

## 2018-05-07 ENCOUNTER — Ambulatory Visit (HOSPITAL_COMMUNITY): Payer: Medicare Other | Admitting: Certified Registered"

## 2018-05-07 ENCOUNTER — Encounter (HOSPITAL_COMMUNITY)
Admission: RE | Disposition: A | Discharge status: Home / Self Care | Payer: Self-pay | Source: Ambulatory Visit | Attending: General Surgery

## 2018-05-07 ENCOUNTER — Other Ambulatory Visit: Payer: Self-pay

## 2018-05-07 ENCOUNTER — Telehealth (HOSPITAL_COMMUNITY): Payer: Self-pay | Admitting: *Deleted

## 2018-05-07 ENCOUNTER — Ambulatory Visit (HOSPITAL_COMMUNITY)
Admission: RE | Admit: 2018-05-07 | Discharge: 2018-05-07 | Disposition: A | Discharge status: Home / Self Care | Payer: Medicare Other | Source: Ambulatory Visit | Attending: General Surgery | Admitting: General Surgery

## 2018-05-07 DIAGNOSIS — C50411 Malignant neoplasm of upper-outer quadrant of right female breast: Secondary | ICD-10-CM | POA: Diagnosis not present

## 2018-05-07 DIAGNOSIS — I10 Essential (primary) hypertension: Secondary | ICD-10-CM | POA: Insufficient documentation

## 2018-05-07 DIAGNOSIS — Z170 Estrogen receptor positive status [ER+]: Secondary | ICD-10-CM | POA: Diagnosis not present

## 2018-05-07 DIAGNOSIS — X58XXXA Exposure to other specified factors, initial encounter: Secondary | ICD-10-CM | POA: Insufficient documentation

## 2018-05-07 DIAGNOSIS — E785 Hyperlipidemia, unspecified: Secondary | ICD-10-CM | POA: Insufficient documentation

## 2018-05-07 DIAGNOSIS — Z886 Allergy status to analgesic agent status: Secondary | ICD-10-CM | POA: Insufficient documentation

## 2018-05-07 DIAGNOSIS — K219 Gastro-esophageal reflux disease without esophagitis: Secondary | ICD-10-CM | POA: Insufficient documentation

## 2018-05-07 DIAGNOSIS — I252 Old myocardial infarction: Secondary | ICD-10-CM | POA: Insufficient documentation

## 2018-05-07 DIAGNOSIS — Z794 Long term (current) use of insulin: Secondary | ICD-10-CM | POA: Insufficient documentation

## 2018-05-07 DIAGNOSIS — K760 Fatty (change of) liver, not elsewhere classified: Secondary | ICD-10-CM | POA: Insufficient documentation

## 2018-05-07 DIAGNOSIS — E119 Type 2 diabetes mellitus without complications: Secondary | ICD-10-CM | POA: Insufficient documentation

## 2018-05-07 DIAGNOSIS — M1990 Unspecified osteoarthritis, unspecified site: Secondary | ICD-10-CM | POA: Insufficient documentation

## 2018-05-07 DIAGNOSIS — Z95828 Presence of other vascular implants and grafts: Secondary | ICD-10-CM

## 2018-05-07 DIAGNOSIS — G5600 Carpal tunnel syndrome, unspecified upper limb: Secondary | ICD-10-CM | POA: Insufficient documentation

## 2018-05-07 DIAGNOSIS — T85628A Displacement of other specified internal prosthetic devices, implants and grafts, initial encounter: Secondary | ICD-10-CM | POA: Diagnosis not present

## 2018-05-07 DIAGNOSIS — Z7982 Long term (current) use of aspirin: Secondary | ICD-10-CM | POA: Insufficient documentation

## 2018-05-07 DIAGNOSIS — J45909 Unspecified asthma, uncomplicated: Secondary | ICD-10-CM | POA: Insufficient documentation

## 2018-05-07 DIAGNOSIS — Z79899 Other long term (current) drug therapy: Secondary | ICD-10-CM | POA: Insufficient documentation

## 2018-05-07 DIAGNOSIS — Z9011 Acquired absence of right breast and nipple: Secondary | ICD-10-CM | POA: Insufficient documentation

## 2018-05-07 DIAGNOSIS — I2510 Atherosclerotic heart disease of native coronary artery without angina pectoris: Secondary | ICD-10-CM | POA: Insufficient documentation

## 2018-05-07 HISTORY — PX: PORT A CATH REVISION: SHX6033

## 2018-05-07 LAB — GLUCOSE, CAPILLARY
Glucose-Capillary: 103 mg/dL — ABNORMAL HIGH (ref 70–99)
Glucose-Capillary: 103 mg/dL — ABNORMAL HIGH (ref 70–99)

## 2018-05-07 SURGERY — PORT A CATH REVISION
Anesthesia: Monitor Anesthesia Care | Site: Chest | Laterality: Left

## 2018-05-07 MED ORDER — DEXAMETHASONE SODIUM PHOSPHATE 10 MG/ML IJ SOLN
INTRAMUSCULAR | Status: AC
  Filled 2018-05-07: qty 1

## 2018-05-07 MED ORDER — HYDROMORPHONE HCL 1 MG/ML IJ SOLN
0.2500 mg | INTRAMUSCULAR | Status: DC | PRN
  Administered 2018-05-07: 0.25 mg via INTRAVENOUS

## 2018-05-07 MED ORDER — 0.9 % SODIUM CHLORIDE (POUR BTL) OPTIME
TOPICAL | Status: DC | PRN
  Administered 2018-05-07: 1000 mL

## 2018-05-07 MED ORDER — OXYCODONE HCL 5 MG/5ML PO SOLN: 5.0000 mg | Freq: Once | ORAL | Status: DC | PRN

## 2018-05-07 MED ORDER — ACETAMINOPHEN 500 MG PO TABS
1000.0000 mg | ORAL_TABLET | ORAL | Status: AC
  Administered 2018-05-07: 08:00:00 1000 mg via ORAL
  Filled 2018-05-07: qty 2

## 2018-05-07 MED ORDER — SODIUM CHLORIDE 0.9 % IV SOLN
Freq: Once | INTRAVENOUS | Status: AC
  Administered 2018-05-07: 500 mL
  Filled 2018-05-07: qty 1.2

## 2018-05-07 MED ORDER — EPHEDRINE SULFATE-NACL 50-0.9 MG/10ML-% IV SOSY
PREFILLED_SYRINGE | INTRAVENOUS | Status: DC | PRN
  Administered 2018-05-07 (×8): 5 mg via INTRAVENOUS

## 2018-05-07 MED ORDER — CHLORHEXIDINE GLUCONATE CLOTH 2 % EX PADS: 6.0000 | MEDICATED_PAD | Freq: Once | CUTANEOUS | Status: DC

## 2018-05-07 MED ORDER — DEXAMETHASONE SODIUM PHOSPHATE 10 MG/ML IJ SOLN
INTRAMUSCULAR | Status: DC | PRN
  Administered 2018-05-07: 4 mg via INTRAVENOUS

## 2018-05-07 MED ORDER — BUPIVACAINE-EPINEPHRINE 0.25% -1:200000 IJ SOLN
INTRAMUSCULAR | Status: DC | PRN
  Administered 2018-05-07: 5 mL

## 2018-05-07 MED ORDER — OXYCODONE HCL 5 MG PO TABS: 5.0000 mg | ORAL_TABLET | Freq: Once | ORAL | Status: DC | PRN

## 2018-05-07 MED ORDER — MIDAZOLAM HCL 2 MG/2ML IJ SOLN
INTRAMUSCULAR | Status: DC | PRN
  Administered 2018-05-07 (×2): 1 mg via INTRAVENOUS

## 2018-05-07 MED ORDER — GABAPENTIN 300 MG PO CAPS
300.0000 mg | ORAL_CAPSULE | ORAL | Status: AC
  Administered 2018-05-07: 300 mg via ORAL
  Filled 2018-05-07: qty 1

## 2018-05-07 MED ORDER — PROPOFOL 10 MG/ML IV BOLUS
INTRAVENOUS | Status: AC
  Filled 2018-05-07: qty 60

## 2018-05-07 MED ORDER — PROMETHAZINE HCL 25 MG/ML IJ SOLN: 6.2500 mg | INTRAMUSCULAR | Status: DC | PRN

## 2018-05-07 MED ORDER — FENTANYL CITRATE (PF) 100 MCG/2ML IJ SOLN
INTRAMUSCULAR | Status: AC
  Filled 2018-05-07: qty 2

## 2018-05-07 MED ORDER — BUPIVACAINE-EPINEPHRINE (PF) 0.25% -1:200000 IJ SOLN
INTRAMUSCULAR | Status: AC
  Filled 2018-05-07: qty 30

## 2018-05-07 MED ORDER — LIDOCAINE 2% (20 MG/ML) 5 ML SYRINGE
INTRAMUSCULAR | Status: AC
  Filled 2018-05-07: qty 5

## 2018-05-07 MED ORDER — CEFAZOLIN SODIUM-DEXTROSE 2-4 GM/100ML-% IV SOLN
2.0000 g | INTRAVENOUS | Status: AC
  Administered 2018-05-07: 09:00:00 2 g via INTRAVENOUS
  Filled 2018-05-07: qty 100

## 2018-05-07 MED ORDER — MIDAZOLAM HCL 2 MG/2ML IJ SOLN
INTRAMUSCULAR | Status: AC
  Filled 2018-05-07: qty 2

## 2018-05-07 MED ORDER — ONDANSETRON HCL 4 MG/2ML IJ SOLN
INTRAMUSCULAR | Status: AC
  Filled 2018-05-07: qty 2

## 2018-05-07 MED ORDER — FENTANYL CITRATE (PF) 100 MCG/2ML IJ SOLN
INTRAMUSCULAR | Status: DC | PRN
  Administered 2018-05-07: 25 ug via INTRAVENOUS

## 2018-05-07 MED ORDER — HYDROMORPHONE HCL 1 MG/ML IJ SOLN
INTRAMUSCULAR | Status: AC
  Filled 2018-05-07: qty 1

## 2018-05-07 MED ORDER — PROPOFOL 500 MG/50ML IV EMUL
INTRAVENOUS | Status: DC | PRN
  Administered 2018-05-07: 70 ug/kg/min via INTRAVENOUS

## 2018-05-07 MED ORDER — ONDANSETRON HCL 4 MG/2ML IJ SOLN
INTRAMUSCULAR | Status: DC | PRN
  Administered 2018-05-07: 4 mg via INTRAVENOUS

## 2018-05-07 MED ORDER — PROPOFOL 10 MG/ML IV BOLUS
INTRAVENOUS | Status: DC | PRN
  Administered 2018-05-07: 10 mg via INTRAVENOUS

## 2018-05-07 MED ORDER — IOHEXOL 300 MG/ML  SOLN
INTRAMUSCULAR | Status: DC | PRN
  Administered 2018-05-07: 10 mL via INTRAVENOUS

## 2018-05-07 MED ORDER — HEPARIN SOD (PORK) LOCK FLUSH 100 UNIT/ML IV SOLN
INTRAVENOUS | Status: AC
  Filled 2018-05-07: qty 5

## 2018-05-07 MED ORDER — LACTATED RINGERS IV SOLN
INTRAVENOUS | Status: DC | PRN
  Administered 2018-05-07: 09:00:00 via INTRAVENOUS

## 2018-05-07 SURGICAL SUPPLY — 48 items
ADH SKN CLS APL DERMABOND .7 (GAUZE/BANDAGES/DRESSINGS) ×1
APL SKNCLS STERI-STRIP NONHPOA (GAUZE/BANDAGES/DRESSINGS)
BAG DECANTER FOR FLEXI CONT (MISCELLANEOUS) ×3 IMPLANT
BENZOIN TINCTURE PRP APPL 2/3 (GAUZE/BANDAGES/DRESSINGS) ×1 IMPLANT
BLADE HEX COATED 2.75 (ELECTRODE) ×1 IMPLANT
BLADE SURG 15 STRL LF DISP TIS (BLADE) ×1 IMPLANT
BLADE SURG 15 STRL SS (BLADE) ×3
CLOSURE WOUND 1/2 X4 (GAUZE/BANDAGES/DRESSINGS)
COVER WAND RF STERILE (DRAPES) IMPLANT
DECANTER SPIKE VIAL GLASS SM (MISCELLANEOUS) ×3 IMPLANT
DERMABOND ADVANCED (GAUZE/BANDAGES/DRESSINGS) ×2
DERMABOND ADVANCED .7 DNX12 (GAUZE/BANDAGES/DRESSINGS) IMPLANT
DRAPE C-ARM 42X120 X-RAY (DRAPES) ×3 IMPLANT
DRAPE LAPAROTOMY TRNSV 102X78 (DRAPE) ×3 IMPLANT
DRSG TEGADERM 4X4.75 (GAUZE/BANDAGES/DRESSINGS) IMPLANT
ELECT PENCIL ROCKER SW 15FT (MISCELLANEOUS) ×3 IMPLANT
ELECT REM PT RETURN 15FT ADLT (MISCELLANEOUS) ×3 IMPLANT
GAUZE 4X4 16PLY RFD (DISPOSABLE) ×3 IMPLANT
GAUZE SPONGE 4X4 12PLY STRL (GAUZE/BANDAGES/DRESSINGS) ×1 IMPLANT
GLOVE BIOGEL PI IND STRL 7.0 (GLOVE) ×1 IMPLANT
GLOVE BIOGEL PI INDICATOR 7.0 (GLOVE) ×2
GOWN STRL REUS W/TWL LRG LVL3 (GOWN DISPOSABLE) ×3 IMPLANT
GOWN STRL REUS W/TWL XL LVL3 (GOWN DISPOSABLE) ×4 IMPLANT
IV KIT MINILOC 20X1 SAFETY (NEEDLE) ×2 IMPLANT
KIT BASIN OR (CUSTOM PROCEDURE TRAY) ×3 IMPLANT
KIT TURNOVER KIT A (KITS) IMPLANT
MARKER SKIN DUAL TIP RULER LAB (MISCELLANEOUS) ×3 IMPLANT
NDL HYPO 25X1 1.5 SAFETY (NEEDLE) ×1 IMPLANT
NDL SAFETY ECLIPSE 18X1.5 (NEEDLE) ×1 IMPLANT
NEEDLE HYPO 18GX1.5 SHARP (NEEDLE)
NEEDLE HYPO 22GX1.5 SAFETY (NEEDLE) ×3 IMPLANT
NEEDLE HYPO 25X1 1.5 SAFETY (NEEDLE) IMPLANT
NS IRRIG 1000ML POUR BTL (IV SOLUTION) ×3 IMPLANT
PACK BASIC VI WITH GOWN DISP (CUSTOM PROCEDURE TRAY) ×3 IMPLANT
STRIP CLOSURE SKIN 1/2X4 (GAUZE/BANDAGES/DRESSINGS) ×1 IMPLANT
SUT MNCRL AB 4-0 PS2 18 (SUTURE) ×3 IMPLANT
SUT PROLENE 2 0 CT2 30 (SUTURE) ×3 IMPLANT
SUT PROLENE 2 0 SH DA (SUTURE) ×2 IMPLANT
SUT SILK 2 0 (SUTURE) ×3
SUT SILK 2-0 18XBRD TIE 12 (SUTURE) IMPLANT
SUT SILK 3 0 (SUTURE)
SUT SILK 3-0 18XBRD TIE 12 (SUTURE) IMPLANT
SUT VIC AB 3-0 SH 27 (SUTURE) ×3
SUT VIC AB 3-0 SH 27XBRD (SUTURE) IMPLANT
SYR 10ML LL (SYRINGE) ×6 IMPLANT
SYR BULB IRRIGATION 50ML (SYRINGE) ×3 IMPLANT
SYR CONTROL 10ML LL (SYRINGE) ×3 IMPLANT
TOWEL OR 17X26 10 PK STRL BLUE (TOWEL DISPOSABLE) ×3 IMPLANT

## 2018-05-07 NOTE — Interval H&P Note (Signed)
History and Physical Interval Note:  05/07/2018 8:25 AM  [name]  has presented today for surgery, with the diagnosis of DISPLACEMENT OF PORT-A-CATH.  The various methods of treatment have been discussed with the patient and family. After consideration of risks, benefits and other options for treatment, the patient has consented to  Procedure(s): PORT A CATH REVISION (N/A) as a surgical intervention.  The patient's history has been reviewed, patient examined, no change in status, stable for surgery.  I have reviewed the patient's chart and labs.  Questions were answered to the patient's satisfaction.     Darene Lamer Mayumi Summerson

## 2018-05-07 NOTE — Anesthesia Procedure Notes (Signed)
Procedure Name: MAC Date/Time: 05/07/2018 8:48 AM Performed by: Eben Burow, CRNA Pre-anesthesia Checklist: Patient identified, Emergency Drugs available, Suction available, Patient being monitored and Timeout performed Oxygen Delivery Method: Simple face mask Dental Injury: Teeth and Oropharynx as per pre-operative assessment

## 2018-05-07 NOTE — Anesthesia Postprocedure Evaluation (Signed)
Anesthesia Post Note  Patient: [name]  Procedure(s) Performed: PORT A CATH REVISION (Left Chest)     Patient location during evaluation: PACU Anesthesia Type: MAC Level of consciousness: awake and alert Pain management: pain level controlled Vital Signs Assessment: post-procedure vital signs reviewed and stable Respiratory status: spontaneous breathing, nonlabored ventilation and respiratory function stable Cardiovascular status: stable and blood pressure returned to baseline Postop Assessment: no apparent nausea or vomiting Anesthetic complications: no    Last Vitals:  Vitals:   05/07/18 1115 05/07/18 1130  BP: 127/72 120/81  Pulse: 65 72  Resp: 14 (!) 26  Temp: 36.4 C   SpO2: 97% 97%    Last Pain:  Vitals:   05/07/18 1130  TempSrc:   PainSc: 0-No pain                 Lynda Rainwater

## 2018-05-07 NOTE — Transfer of Care (Signed)
Immediate Anesthesia Transfer of Care Note  Patient: [name]  Procedure(s) Performed: PORT A CATH REVISION (Left Chest)  Patient Location: PACU  Anesthesia Type:MAC  Level of Consciousness: awake, drowsy and patient cooperative  Airway & Oxygen Therapy: Patient Spontanous Breathing and Patient connected to face mask oxygen  Post-op Assessment: Report given to RN and Post -op Vital signs reviewed and stable  Post vital signs: Reviewed and stable  Last Vitals:  Vitals Value Taken Time  BP 120/71 05/07/2018 10:11 AM  Temp    Pulse 67 05/07/2018 10:14 AM  Resp    SpO2 100 % 05/07/2018 10:14 AM  Vitals shown include unvalidated device data.  Last Pain:  Vitals:   05/07/18 0726  TempSrc:   PainSc: 3       Patients Stated Pain Goal: 3 (85/27/78 2423)  Complications: No apparent anesthesia complications

## 2018-05-07 NOTE — Discharge Instructions (Signed)
    PORT-A-CATH: POST OP INSTRUCTIONS  Always review your discharge instruction sheet given to you by the facility where your surgery was performed.   1. A prescription for pain medication may be given to you upon discharge. Take your pain medication as prescribed, if needed. If narcotic pain medicine is not needed, then you make take acetaminophen (Tylenol) or ibuprofen (Advil) as needed.  2. Take your usually prescribed medications unless otherwise directed. 3. If you need a refill on your pain medication, please contact our office. All narcotic pain medicine now requires a paper prescription.  Phoned in and fax refills are no longer allowed by law.  Prescriptions will not be filled after 5 pm or on weekends.  4. You should follow a light diet for the remainder of the day after your procedure. 5. Most patients will experience some mild swelling and/or bruising in the area of the incision. It may take several days to resolve. 6. It is common to experience some constipation if taking pain medication after surgery. Increasing fluid intake and taking a stool softener (such as Colace) will usually help or prevent this problem from occurring. A mild laxative (Milk of Magnesia or Miralax) should be taken according to package directions if there are no bowel movements after 48 hours.  7. Unless discharge instructions indicate otherwise, you may remove your bandages 48 hours after surgery, and you may shower at that time. You may have steri-strips (small white skin tapes) in place directly over the incision.  These strips should be left on the skin for 7-10 days.  If your surgeon used Dermabond (skin glue) on the incision, you may shower in 24 hours.  The glue will flake off over the next 2-3 weeks.  8. If your port is left accessed at the end of surgery (needle left in port), the dressing cannot get wet and should only by changed by a healthcare professional. When the port is no longer accessed (when the  needle has been removed), follow step 7.   9. ACTIVITIES:  Limit activity involving your arms for the next 72 hours. Do no strenuous exercise or activity for 1 week. You may drive when you are no longer taking prescription pain medication, you can comfortably wear a seatbelt, and you can maneuver your car. 10.You may need to see your doctor in the office for a follow-up appointment.  Please       check with your doctor.  11.When you receive a new Port-a-Cath, you will get a product guide and        ID card.  Please keep them in case you need them.  WHEN TO CALL YOUR DOCTOR ([phone]): 1. Fever over 101.0 2. Chills 3. Continued bleeding from incision 4. Increased redness and tenderness at the site 5. Shortness of breath, difficulty breathing   The clinic staff is available to answer your questions during regular business hours. Please don't hesitate to call and ask to speak to one of the nurses or medical assistants for clinical concerns. If you have a medical emergency, go to the nearest emergency room or call 911.  A surgeon from [facility] is always on call at the hospital.     For further information, please visit [email or web address]      

## 2018-05-07 NOTE — Op Note (Signed)
Preoperative Diagnosis: DISPLACEMENT OF PORT-A-CATH  Postoprative Diagnosis: DISPLACEMENT OF PORT-A-CATH  Procedure: Procedure(s): PORT A CATH REVISION   Surgeon: Excell Seltzer T   Assistants: None  Anesthesia:  Monitored Local Anesthesia with Sedation  Indications: Patient has a Port-A-Cath in place in the left chest placed in view of the left internal jugular vein a number of weeks ago.  She is receiving Herceptin as adjuvant treatment for breast cancer.  It initially was in good position in the SVC but on recent imaging the tip of the catheter has pulled back to the junction of the brachiocephalic vein and the proximal SVC.  The cancer center is reluctant to use it in this position and after discussion we have elected to revise the present catheter and reposition it.  This has been discussed in detail with the patient including the nature of the procedure and indications and possible need to replace the Port-A-Cath if revision is not possible.  She understands and agrees.    Procedure Detail: Patient was brought to the operating room, placed in supine position on the operating table and IV sedation administered.  She received preoperative IV antibiotics.  The entire neck and anterior chest were widely sterilely prepped and draped.  Patient timeout was performed and correct procedure verified.  I initially obtained a fluoroscopic image showing the tip still at the brachiocephalic/SVC junction.  Also noted however was an apparent new finding of some increased density in the right chest consistent with possible effusion or infiltrate.  The patient was perfectly stable under anesthesia with normal O2 saturations. Local anesthesia was used to infiltrate the insertion site in the left neck and extensively infiltrate the port incision and skin and subcutaneous tissue above and below the incision.  I then made a small incision overlying the catheter insertion site and with careful blunt dissection  the catheter was isolated in the neck at its insertion site.  The previous port incision was opened.  Previous suture material was removed and the port mobilized out of its pocket.  I then created a new subcutaneous pocket superior to the incision to allow the port and the catheter to advance several centimeters.  The port was placed into this new superior pocket and the catheter at the neck was advanced at least 2 to 3 cm.  Fluoroscopy showed the catheter tip in good position in the SVC.  It was a little difficult to see due to the apparent increased density in the right chest.  I sutured the catheter in the new position with 2 interrupted Prolene sutures.  We then obtained one more fluoroscopic image while injecting the catheter with contrast which confirmed the tip in good position in the SVC.  The catheter was then flushed with concentrated heparin solution.  The port incision was closed with subcutaneous interrupted 3-0 Vicryl and both skin incisions closed with subcuticular 4-0 Monocryl and Dermabond.  Sponge needle instrument counts were correct.  Patient was taken to PACU in good condition.    Findings: As above  Estimated Blood Loss:  Minimal         Drains: None  Blood Given: none          Specimens: None        Complications:  * No complications entered in OR log *         Disposition: PACU - hemodynamically stable.         Condition: stable

## 2018-05-08 ENCOUNTER — Encounter (HOSPITAL_COMMUNITY): Payer: Self-pay | Admitting: General Surgery

## 2018-05-09 ENCOUNTER — Telehealth: Payer: Self-pay | Admitting: Surgery

## 2018-05-09 NOTE — Telephone Encounter (Signed)
Patient had port revision on 4/1 by Dr. Excell Seltzer.  Nurse Tammy talked to her on the phone - patient complained of swelling in the arm pit.  No trouble breathing.  Nurse will communicate to patient to call our office Monday if no better.  Alphonsa Overall, MD, [facility] Surgery Pager: [phone] Office phone:  [phone]

## 2018-05-13 NOTE — Assessment & Plan Note (Deleted)
11/27/2017 right mastectomy: IDC grade 2, 1.6 cm, separate foci of DCIS intermediate grade, margins negative, 0/5 lymph nodes negative,ER 100%, PR 50%, Ki-67 15%, HER-2 positive ratio 2, copy #5 T1c N0 stage Ia  Treatment plan: 1.  Adjuvant Herceptin every 3 weeks for 1 year (because of poor performance status Taxol is felt to be intolerable for her) 2.  Adjuvant antiestrogen therapy with letrozole 2.5 mg daily  Patient's daughter is the primary point of contact for her and all appointment changes will need to go through her. -------------------------------------------------------------------------------------- Current treatment: Herceptin with anastrozole started 02/07/2018 Echo 02/17/2018 EF 55-60%--followed by Bensimhon  Tolerating anastrozole and Herceptin well.  Will continue this.  Severe anemia: Given PRBCs.  Return to clinic in 3 weeks for Herceptin every 6 weeks for follow-up with me 

## 2018-05-19 ENCOUNTER — Encounter (HOSPITAL_COMMUNITY): Payer: Self-pay | Admitting: Internal Medicine

## 2018-05-22 ENCOUNTER — Inpatient Hospital Stay: Payer: Medicare Other | Admitting: Hematology and Oncology

## 2018-05-22 ENCOUNTER — Encounter: Payer: Self-pay | Admitting: *Deleted

## 2018-05-22 ENCOUNTER — Inpatient Hospital Stay: Payer: Medicare Other

## 2018-05-22 ENCOUNTER — Telehealth: Payer: Self-pay

## 2018-05-22 NOTE — Telephone Encounter (Signed)
Nurse spoke with patient to confirm appointments for 05/23/2018.  Transportation has been confirmed with staff.  Patient will be picked up at 7:15, patient voiced understanding.

## 2018-05-23 ENCOUNTER — Inpatient Hospital Stay: Payer: Medicare Other | Attending: Oncology

## 2018-05-23 ENCOUNTER — Other Ambulatory Visit: Payer: Self-pay

## 2018-05-23 ENCOUNTER — Inpatient Hospital Stay (HOSPITAL_BASED_OUTPATIENT_CLINIC_OR_DEPARTMENT_OTHER): Payer: Medicare Other | Admitting: Hematology and Oncology

## 2018-05-23 ENCOUNTER — Inpatient Hospital Stay: Payer: Medicare Other

## 2018-05-23 DIAGNOSIS — Z79899 Other long term (current) drug therapy: Secondary | ICD-10-CM | POA: Insufficient documentation

## 2018-05-23 DIAGNOSIS — Z7984 Long term (current) use of oral hypoglycemic drugs: Secondary | ICD-10-CM | POA: Diagnosis not present

## 2018-05-23 DIAGNOSIS — Z7951 Long term (current) use of inhaled steroids: Secondary | ICD-10-CM | POA: Diagnosis not present

## 2018-05-23 DIAGNOSIS — D649 Anemia, unspecified: Secondary | ICD-10-CM | POA: Diagnosis not present

## 2018-05-23 DIAGNOSIS — Z170 Estrogen receptor positive status [ER+]: Principal | ICD-10-CM

## 2018-05-23 DIAGNOSIS — Z7982 Long term (current) use of aspirin: Secondary | ICD-10-CM | POA: Diagnosis not present

## 2018-05-23 DIAGNOSIS — C50411 Malignant neoplasm of upper-outer quadrant of right female breast: Secondary | ICD-10-CM | POA: Diagnosis not present

## 2018-05-23 DIAGNOSIS — Z9011 Acquired absence of right breast and nipple: Secondary | ICD-10-CM | POA: Insufficient documentation

## 2018-05-23 DIAGNOSIS — Z95828 Presence of other vascular implants and grafts: Secondary | ICD-10-CM

## 2018-05-23 DIAGNOSIS — T50995A Adverse effect of other drugs, medicaments and biological substances, initial encounter: Secondary | ICD-10-CM | POA: Insufficient documentation

## 2018-05-23 DIAGNOSIS — R0602 Shortness of breath: Secondary | ICD-10-CM | POA: Diagnosis not present

## 2018-05-23 DIAGNOSIS — Z79811 Long term (current) use of aromatase inhibitors: Secondary | ICD-10-CM | POA: Insufficient documentation

## 2018-05-23 DIAGNOSIS — Z5112 Encounter for antineoplastic immunotherapy: Secondary | ICD-10-CM | POA: Insufficient documentation

## 2018-05-23 LAB — CMP (CANCER CENTER ONLY)
ALT: 10 U/L (ref 0–44)
AST: 16 U/L (ref 15–41)
Albumin: 3.5 g/dL (ref 3.5–5.0)
Alkaline Phosphatase: 134 U/L — ABNORMAL HIGH (ref 38–126)
Anion gap: 9 (ref 5–15)
BUN: 23 mg/dL (ref 8–23)
CO2: 26 mmol/L (ref 22–32)
Calcium: 10.4 mg/dL — ABNORMAL HIGH (ref 8.9–10.3)
Chloride: 104 mmol/L (ref 98–111)
Creatinine: 2.04 mg/dL — ABNORMAL HIGH (ref 0.44–1.00)
GFR, Est AFR Am: 28 mL/min — ABNORMAL LOW (ref >60)
GFR, Estimated: 24 mL/min — ABNORMAL LOW (ref >60)
Glucose, Bld: 88 mg/dL (ref 70–99)
Potassium: 5.1 mmol/L (ref 3.5–5.1)
Sodium: 139 mmol/L (ref 135–145)
Total Bilirubin: 0.5 mg/dL (ref 0.3–1.2)
Total Protein: 9 g/dL — ABNORMAL HIGH (ref 6.5–8.1)

## 2018-05-23 LAB — CBC WITH DIFFERENTIAL (CANCER CENTER ONLY)
Abs Immature Granulocytes: 0.03 10*3/uL (ref 0.00–0.07)
Basophils Absolute: 0 10*3/uL (ref 0.0–0.1)
Basophils Relative: 0 %
Eosinophils Absolute: 0.1 10*3/uL (ref 0.0–0.5)
Eosinophils Relative: 2 %
HCT: 32.8 % — ABNORMAL LOW (ref 36.0–46.0)
Hemoglobin: 9 g/dL — ABNORMAL LOW (ref 12.0–15.0)
Immature Granulocytes: 0 %
Lymphocytes Relative: 23 %
Lymphs Abs: 1.7 10*3/uL (ref 0.7–4.0)
MCH: 22.2 pg — ABNORMAL LOW (ref 26.0–34.0)
MCHC: 27.4 g/dL — ABNORMAL LOW (ref 30.0–36.0)
MCV: 81 fL (ref 80.0–100.0)
Monocytes Absolute: 0.8 10*3/uL (ref 0.1–1.0)
Monocytes Relative: 10 %
Neutro Abs: 4.9 10*3/uL (ref 1.7–7.7)
Neutrophils Relative %: 65 %
Platelet Count: 435 10*3/uL — ABNORMAL HIGH (ref 150–400)
RBC: 4.05 MIL/uL (ref 3.87–5.11)
RDW: 18.9 % — ABNORMAL HIGH (ref 11.5–15.5)
WBC Count: 7.6 10*3/uL (ref 4.0–10.5)
nRBC: 0 % (ref 0.0–0.2)

## 2018-05-23 MED ORDER — SODIUM CHLORIDE 0.9% FLUSH
10.0000 mL | INTRAVENOUS | Status: DC | PRN
  Administered 2018-05-23: 10 mL
  Filled 2018-05-23: qty 10

## 2018-05-23 MED ORDER — ACETAMINOPHEN 325 MG PO TABS
650.0000 mg | ORAL_TABLET | Freq: Once | ORAL | Status: AC
  Administered 2018-05-23: 650 mg via ORAL

## 2018-05-23 MED ORDER — DIPHENHYDRAMINE HCL 25 MG PO CAPS
ORAL_CAPSULE | ORAL | Status: AC
  Filled 2018-05-23: qty 2

## 2018-05-23 MED ORDER — ACETAMINOPHEN 325 MG PO TABS
ORAL_TABLET | ORAL | Status: AC
  Filled 2018-05-23: qty 2

## 2018-05-23 MED ORDER — DIPHENHYDRAMINE HCL 25 MG PO CAPS
50.0000 mg | ORAL_CAPSULE | Freq: Once | ORAL | Status: AC
  Administered 2018-05-23: 50 mg via ORAL

## 2018-05-23 MED ORDER — HEPARIN SOD (PORK) LOCK FLUSH 100 UNIT/ML IV SOLN
500.0000 [IU] | Freq: Once | INTRAVENOUS | Status: AC | PRN
  Administered 2018-05-23: 500 [IU]
  Filled 2018-05-23: qty 5

## 2018-05-23 MED ORDER — SODIUM CHLORIDE 0.9 % IV SOLN
510.0000 mg | Freq: Once | INTRAVENOUS | Status: AC
  Administered 2018-05-23: 510 mg via INTRAVENOUS
  Filled 2018-05-23: qty 17

## 2018-05-23 MED ORDER — TRASTUZUMAB CHEMO 150 MG IV SOLR
6.0000 mg/kg | Freq: Once | INTRAVENOUS | Status: AC
  Administered 2018-05-23: 357 mg via INTRAVENOUS
  Filled 2018-05-23: qty 17

## 2018-05-23 MED ORDER — SODIUM CHLORIDE 0.9 % IV SOLN
Freq: Once | INTRAVENOUS | Status: AC
  Administered 2018-05-23: 09:00:00 via INTRAVENOUS
  Filled 2018-05-23: qty 250

## 2018-05-23 NOTE — Patient Instructions (Signed)
[facility] Discharge Instructions for Patients Receiving Chemotherapy  Today you received the following chemotherapy agents Herceptin  To help prevent nausea and vomiting after your treatment, we encourage you to take your nausea medication as directed   If you develop nausea and vomiting that is not controlled by your nausea medication, call the clinic.   BELOW ARE SYMPTOMS THAT SHOULD BE REPORTED IMMEDIATELY:  *FEVER GREATER THAN 100.5 F  *CHILLS WITH OR WITHOUT FEVER  NAUSEA AND VOMITING THAT IS NOT CONTROLLED WITH YOUR NAUSEA MEDICATION  *UNUSUAL SHORTNESS OF BREATH  *UNUSUAL BRUISING OR BLEEDING  TENDERNESS IN MOUTH AND THROAT WITH OR WITHOUT PRESENCE OF ULCERS  *URINARY PROBLEMS  *BOWEL PROBLEMS  UNUSUAL RASH Items with * indicate a potential emergency and should be followed up as soon as possible.  Feel free to call the clinic should you have any questions or concerns. The clinic phone number is [phone].  Please show the CHEMO ALERT CARD at check-in to the Emergency Department and triage nurse.   

## 2018-05-23 NOTE — Progress Notes (Signed)
Patient Care Team: Tsosie Billing, MD as PCP - General (Internal Medicine)  DIAGNOSIS:  Encounter Diagnosis  Name Primary?  . Malignant neoplasm of upper-outer quadrant of right breast in female, estrogen receptor positive ([place])     SUMMARY OF ONCOLOGIC HISTORY:   Malignant neoplasm of upper-outer quadrant of right breast in female, estrogen receptor positive ([place])   09/03/2017 Initial Diagnosis    Screening mammogram detected 1.4 cm right breast mass at 10 o'clock position, additional pleomorphic calcifications 2 cm posteriorly, additional loosely grouped calcifications 10.1 x 7.1 x 5.3 cm, single right axillary lymph node: Biopsy revealed DCIS ER 100%, PR 50%; IDC grade 2-3 ER 100%, PR 50%, Ki-67 15%, HER-2 positive ratio 2, copy #5, lymph node negative T2 N0 stage Ia clinical stage AJCC 8    09/03/2017 Cancer Staging    Staging form: Breast, AJCC 8th Edition - Clinical stage from 09/03/2017: Stage IB (cT2, cN0, cM0, G3, ER+, PR+, HER2+) - Signed by Gardenia Phlegm, NP on 04/10/2018    11/27/2017 Surgery    Right mastectomy: IDC grade 2, 1.6 cm, separate foci of DCIS intermediate grade, margins negative, 0/5 lymph nodes negative,ER 100%, PR 50%, Ki-67 15%, HER-2 positive ratio 2, copy #5 T1c N0 stage Ia    11/27/2017 Cancer Staging    Staging form: Breast, AJCC 8th Edition - Pathologic stage from 11/27/2017: Stage IA (pT1c, pN0, cM0, G2, ER+, PR+, HER2+) - Signed by Gardenia Phlegm, NP on 04/10/2018    02/07/2018 -  Chemotherapy    The patient had trastuzumab (HERCEPTIN) 483 mg in sodium chloride 0.9 % 250 mL chemo infusion, 8 mg/kg = 483 mg, Intravenous,  Once, 5 of 18 cycles Administration: 483 mg (02/07/2018), 357 mg (02/27/2018), 357 mg (05/01/2018), 357 mg (03/20/2018), 357 mg (04/10/2018)  for chemotherapy treatment.     02/27/2018 -  Anti-estrogen oral therapy    Anastrozole 50m daily     CHIEF COMPLIANT: Follow-up on Herceptin with anastrozole  INTERVAL  HISTORY: [name] a 68year old with above-mentioned history of right breast cancer who is currently on adjuvant therapy with Herceptin along with anastrozole.  She is tolerating this treatment fairly well.  Does not have any hot flashes or myalgias.  Denies any adverse effects to Herceptin.  Previously she was anemic and required blood transfusion.  She is complaining of itchiness in the right surgical scars.  Also complaining of mild discomfort in the port area.  She is using a walker to get around.  REVIEW OF SYSTEMS:   Constitutional: Denies fevers, chills or abnormal weight loss Eyes: Denies blurriness of vision Ears, nose, mouth, throat, and face: Denies mucositis or sore throat Respiratory: Denies cough, dyspnea or wheezes Cardiovascular: Denies palpitation, chest discomfort Gastrointestinal:  Denies nausea, heartburn or change in bowel habits Skin: Denies abnormal skin rashes Lymphatics: Denies new lymphadenopathy or easy bruising Neurological:Denies numbness, tingling or new weaknesses Behavioral/Psych: Mood is stable, no new changes  Extremities: Chronic generalized weakness uses a walker to get around Breast:  denies any pain or lumps or nodules in either breasts All other systems were reviewed with the patient and are negative.  I have reviewed the past medical history, past surgical history, social history and family history with the patient and they are unchanged from previous note.  ALLERGIES:  has No Known Allergies.  MEDICATIONS:  Current Outpatient Medications  Medication Sig Dispense Refill  . albuterol (PROVENTIL) (2.5 MG/3ML) 0.083% nebulizer solution Take 2.5 mg by nebulization every 6 (  six) hours as needed for wheezing or shortness of breath.     . ALPRAZolam (XANAX) 0.25 MG tablet Take 0.25 mg by mouth 2 (two) times daily.     Marland Kitchen amLODipine (NORVASC) 5 MG tablet Take 5 mg by mouth daily.    Marland Kitchen anastrozole (ARIMIDEX) 1 MG tablet Take 1 tablet (1 mg  total) by mouth daily. 90 tablet 3  . aspirin EC 81 MG EC tablet Take 1 tablet (81 mg total) by mouth daily. 30 tablet 0  . atorvastatin (LIPITOR) 20 MG tablet Take 20 mg by mouth daily.    . budesonide-formoterol (SYMBICORT) 160-4.5 MCG/ACT inhaler Inhale 1 puff into the lungs 2 (two) times daily.     . feeding supplement, ENSURE ENLIVE, (ENSURE ENLIVE) LIQD Take 237 mLs by mouth 2 (two) times daily between meals. 60 Bottle 0  . HYDROcodone-acetaminophen (NORCO/VICODIN) 5-325 MG tablet Take 1 tablet by mouth every 4 (four) hours as needed. (Patient taking differently: Take 1 tablet by mouth every 4 (four) hours as needed for moderate pain. ) 10 tablet 0  . lidocaine-prilocaine (EMLA) cream Apply to affected area once (Patient not taking: Reported on 04/10/2018) 30 g 3  . loperamide (IMODIUM) 2 MG capsule Take 1 capsule (2 mg total) by mouth 4 (four) times daily as needed for diarrhea or loose stools. 12 capsule 0  . metFORMIN (GLUCOPHAGE) 1000 MG tablet Take 500 mg by mouth 2 (two) times daily with a meal.     . metoprolol succinate (TOPROL-XL) 50 MG 24 hr tablet Take 50 mg by mouth at bedtime. Take with or immediately following a meal.    . nitroGLYCERIN (NITROSTAT) 0.4 MG SL tablet Place 1 tablet (0.4 mg total) under the tongue every 5 (five) minutes as needed. For chest pain (Patient taking differently: Place 0.4 mg under the tongue every 5 (five) minutes as needed for chest pain. ) 10 tablet 0  . Omega-3 Fatty Acids (FISH OIL) 1000 MG CAPS Take 1,000 mg by mouth daily.    . ondansetron (ZOFRAN) 8 MG tablet Take 1 tablet (8 mg total) by mouth every 8 (eight) hours as needed for nausea or vomiting. 20 tablet 0  . pantoprazole (PROTONIX) 40 MG tablet Take 1 tablet (40 mg total) by mouth daily. 60 tablet 0   No current facility-administered medications for this visit.     PHYSICAL EXAMINATION: ECOG PERFORMANCE STATUS: 1 - Symptomatic but completely ambulatory  Vitals:   05/23/18 0824  BP:  129/73  Pulse: 64  Resp: 18  Temp: 98.5 F (36.9 C)  SpO2: 100%   Filed Weights   05/23/18 0824  Weight: 122 lb 11.2 oz (55.7 kg)    GENERAL:alert, no distress and comfortable SKIN: skin color, texture, turgor are normal, no rashes or significant lesions EYES: normal, Conjunctiva are pink and non-injected, sclera clear OROPHARYNX:no exudate, no erythema and lips, buccal mucosa, and tongue normal  NECK: supple, thyroid normal size, non-tender, without nodularity LYMPH:  no palpable lymphadenopathy in the cervical, axillary or inguinal LUNGS: clear to auscultation and percussion with normal breathing effort HEART: regular rate & rhythm and no murmurs and no lower extremity edema ABDOMEN:abdomen soft, non-tender and normal bowel sounds MUSCULOSKELETAL:no cyanosis of digits and no clubbing  NEURO: alert & oriented x 3 with fluent speech, no focal motor/sensory deficits EXTREMITIES: No lower extremity edema   LABORATORY DATA:  I have reviewed the data as listed CMP Latest Ref Rng & Units 05/01/2018 04/10/2018 03/22/2018  Glucose 70 - 99  mg/dL 89 63(L) 90  BUN 8 - 23 mg/dL _0 Creatinine 0.44 - 1.00 mg/dL 1.75(H) 1.94(H) 1.56(H)  Sodium 135 - 145 mmol/L 139 139 138  Potassium 3.5 - 5.1 mmol/L 4.9 4.7 4.0  Chloride 98 - 111 mmol/L 105 107 105  CO2 22 - 32 mmol/L _1 Calcium 8.9 - 10.3 mg/dL 9.5 9.2 9.6  Total Protein 6.5 - 8.1 g/dL 9.0(H) 8.7(H) 8.7(H)  Total Bilirubin 0.3 - 1.2 mg/dL 0.7 0.6 0.7  Alkaline Phos 38 - 126 U/L 146(H) 121 112  AST 15 - 41 U/L 12(L) 9(L) 15  ALT 0 - 44 U/L _2 Lab Results  Component Value Date   WBC 7.6 05/23/2018   HGB 9.0 (L) 05/23/2018   HCT 32.8 (L) 05/23/2018   MCV 81.0 05/23/2018   PLT 435 (H) 05/23/2018   NEUTROABS 4.9 05/23/2018    ASSESSMENT & PLAN:  Malignant neoplasm of upper-outer quadrant of right breast in female, estrogen receptor positive ([place]) 11/27/2017 right mastectomy: IDC grade 2, 1.6 cm, separate foci of  DCIS intermediate grade, margins negative, 0/5 lymph nodes negative,ER 100%, PR 50%, Ki-67 15%, HER-2 positive ratio 2, copy #5 T1c N0 stage Ia  Treatment plan: 1.  Adjuvant Herceptin every 3 weeks for 1 year (because of poor performance status Taxol is felt to be intolerable for her) 2.  Adjuvant antiestrogen therapy with letrozole 2.5 mg daily  Patient's daughter is the primary point of contact for her and all appointment changes will need to go through her. -------------------------------------------------------------------------------------- Current treatment: Herceptin with anastrozole started 02/07/2018 Echo 02/17/2018 EF 55-60%--followed by Bensimhon  Tolerating anastrozole and Herceptin well.  Will continue this.  Severe anemia: Given PRBCs previously.  Today's hemoglobin is 9. Itching sensation on the right chest wall: I instructed her to apply moisturizing cream. Soreness around the port  Return to clinic in 3 weeks for Herceptin every 6 weeks for follow-up with me  No orders of the defined types were placed in this encounter.  The patient has a good understanding of the overall plan. she agrees with it. she will call with any problems that may develop before the next visit here.   Harriette Ohara, MD 05/23/18

## 2018-05-23 NOTE — Progress Notes (Signed)
Okay proceed with treatment of Herceptin with Echocardiogram from 02/2018 per Dr. Lindi Adie.

## 2018-05-23 NOTE — Assessment & Plan Note (Signed)
11/27/2017 right mastectomy: IDC grade 2, 1.6 cm, separate foci of DCIS intermediate grade, margins negative, 0/5 lymph nodes negative,ER 100%, PR 50%, Ki-67 15%, HER-2 positive ratio 2, copy #5 T1c N0 stage Ia  Treatment plan: 1.  Adjuvant Herceptin every 3 weeks for 1 year (because of poor performance status Taxol is felt to be intolerable for her) 2.  Adjuvant antiestrogen therapy with letrozole 2.5 mg daily  Patient's daughter is the primary point of contact for her and all appointment changes will need to go through her. -------------------------------------------------------------------------------------- Current treatment: Herceptin with anastrozole started 02/07/2018 Echo 02/17/2018 EF 55-60%--followed by Bensimhon  Tolerating anastrozole and Herceptin well.  Will continue this.  Severe anemia: Given PRBCs.  Return to clinic in 3 weeks for Herceptin every 6 weeks for follow-up with me

## 2018-05-29 ENCOUNTER — Inpatient Hospital Stay: Payer: Medicare Other

## 2018-05-29 ENCOUNTER — Other Ambulatory Visit: Payer: Self-pay

## 2018-05-29 ENCOUNTER — Inpatient Hospital Stay (HOSPITAL_BASED_OUTPATIENT_CLINIC_OR_DEPARTMENT_OTHER): Payer: Medicare Other | Admitting: Medical

## 2018-05-29 VITALS — BP 142/74 | HR 79 | Temp 99.1°F | Resp 16

## 2018-05-29 DIAGNOSIS — C50411 Malignant neoplasm of upper-outer quadrant of right female breast: Secondary | ICD-10-CM | POA: Diagnosis not present

## 2018-05-29 DIAGNOSIS — T8090XA Unspecified complication following infusion and therapeutic injection, initial encounter: Secondary | ICD-10-CM

## 2018-05-29 DIAGNOSIS — Z170 Estrogen receptor positive status [ER+]: Secondary | ICD-10-CM | POA: Diagnosis not present

## 2018-05-29 DIAGNOSIS — Z9011 Acquired absence of right breast and nipple: Secondary | ICD-10-CM

## 2018-05-29 DIAGNOSIS — Z79811 Long term (current) use of aromatase inhibitors: Secondary | ICD-10-CM

## 2018-05-29 DIAGNOSIS — Z95828 Presence of other vascular implants and grafts: Secondary | ICD-10-CM

## 2018-05-29 DIAGNOSIS — Z7984 Long term (current) use of oral hypoglycemic drugs: Secondary | ICD-10-CM

## 2018-05-29 DIAGNOSIS — R0602 Shortness of breath: Secondary | ICD-10-CM | POA: Diagnosis not present

## 2018-05-29 DIAGNOSIS — Z7982 Long term (current) use of aspirin: Secondary | ICD-10-CM

## 2018-05-29 DIAGNOSIS — D500 Iron deficiency anemia secondary to blood loss (chronic): Secondary | ICD-10-CM

## 2018-05-29 DIAGNOSIS — D649 Anemia, unspecified: Secondary | ICD-10-CM

## 2018-05-29 DIAGNOSIS — Z7951 Long term (current) use of inhaled steroids: Secondary | ICD-10-CM

## 2018-05-29 DIAGNOSIS — Z5112 Encounter for antineoplastic immunotherapy: Secondary | ICD-10-CM | POA: Diagnosis not present

## 2018-05-29 DIAGNOSIS — Z79899 Other long term (current) drug therapy: Secondary | ICD-10-CM

## 2018-05-29 MED ORDER — DIPHENHYDRAMINE HCL 50 MG/ML IJ SOLN
25.0000 mg | Freq: Once | INTRAMUSCULAR | Status: AC
  Administered 2018-05-29: 25 mg via INTRAVENOUS

## 2018-05-29 MED ORDER — SODIUM CHLORIDE 0.9 % IV SOLN
510.0000 mg | Freq: Once | INTRAVENOUS | Status: AC
  Administered 2018-05-29: 510 mg via INTRAVENOUS
  Filled 2018-05-29: qty 17

## 2018-05-29 MED ORDER — SODIUM CHLORIDE 0.9% FLUSH
10.0000 mL | INTRAVENOUS | Status: DC | PRN
  Administered 2018-05-29: 18:00:00 10 mL
  Filled 2018-05-29: qty 10

## 2018-05-29 MED ORDER — SODIUM CHLORIDE 0.9 % IV SOLN: 25.0000 mg | Freq: Once | INTRAVENOUS | Status: DC

## 2018-05-29 MED ORDER — HEPARIN SOD (PORK) LOCK FLUSH 100 UNIT/ML IV SOLN
500.0000 [IU] | Freq: Once | INTRAVENOUS | Status: AC | PRN
  Administered 2018-05-29: 500 [IU]
  Filled 2018-05-29: qty 5

## 2018-05-29 MED ORDER — FAMOTIDINE IN NACL 20-0.9 MG/50ML-% IV SOLN
20.0000 mg | Freq: Once | INTRAVENOUS | Status: AC
  Administered 2018-05-29: 16:00:00 20 mg via INTRAVENOUS

## 2018-05-29 MED ORDER — SODIUM CHLORIDE 0.9 % IV SOLN
INTRAVENOUS | Status: DC
  Administered 2018-05-29: 15:00:00 via INTRAVENOUS
  Filled 2018-05-29: qty 250

## 2018-05-29 NOTE — Progress Notes (Signed)
    DATE:  05/29/2018                                          X  INFUSION REACTION          MD:  Dr. Nicholas Lose   AGENT/BLOOD PRODUCT RECEIVING TODAY:              Feraheme   AGENT/BLOOD PRODUCT RECEIVING IMMEDIATELY PRIOR TO REACTION:          Feraheme   VS: BP:     132/72  P:       74       SPO2:       100 % on O2 @ 2 LPM via [place]                  REACTION(S):           SSCP, SOB and mild aggitation   PREMEDS:      none   INTERVENTION: Pepcid 20 mg IV x 1 and Benadryl 25 mg IV x 1, AN EKG was completed which was of poor quality but did not appear to show acute changes.   Review of Systems  Review of Systems  Constitutional: Negative for chills, diaphoresis and fever.  HENT: Negative for trouble swallowing and voice change.   Respiratory: Positive for shortness of breath. Negative for cough, chest tightness and wheezing.   Cardiovascular: Positive for chest pain. Negative for palpitations.  Gastrointestinal: Negative for abdominal pain, constipation, diarrhea, nausea and vomiting.  Musculoskeletal: Negative for back pain and myalgias.  Neurological: Negative for dizziness, light-headedness and headaches.  Psychiatric/Behavioral: The patient is nervous/anxious.      Physical Exam  Physical Exam Constitutional:      General: She is not in acute distress.    Appearance: She is not diaphoretic.  HENT:     Head: Normocephalic and atraumatic.  Cardiovascular:     Rate and Rhythm: Normal rate and regular rhythm.     Heart sounds: Normal heart sounds. No murmur. No friction rub. No gallop.   Pulmonary:     Effort: Pulmonary effort is normal. No respiratory distress.     Breath sounds: Normal breath sounds. No wheezing or rales.  Skin:    General: Skin is warm and dry.     Findings: No erythema or rash.  Neurological:     Mental Status: She is alert.     OUTCOME:                The patient's symptoms abated after dosing with Pepcid and Benadryl. Feraheme was restarted  and completed without further concerns.   Sandi Mealy, MHS, PA-C

## 2018-05-29 NOTE — Patient Instructions (Signed)

## 2018-05-29 NOTE — Progress Notes (Addendum)
At approximately 1610, 5 minutes into the patient's fereheme infusion, the patient complained of sharp pain in the chest located around the port area. The pain radiated to the back. The infusion was stopped and emergency protocol was placed. Vitals were stable. Sandi Mealy, PA order pepcid, and EKG then and  Benedryl. The EKG showed no acute changes. The patient's symptoms resolved.  After the benedryl was given, the nurse waited 5 minutes per instruction and restarted the infusion. The patient stayed 30 minutes post infusion for monitoring. The patient had not further complaints and was discharged home.

## 2018-06-11 ENCOUNTER — Telehealth: Payer: Self-pay | Admitting: *Deleted

## 2018-06-11 NOTE — Telephone Encounter (Signed)
Received call from pt stating she had symptoms of a head cold.  Pt states she has a clear drainage coming out of her nose and a "stuffy" feeling in her head.  Pt denise signs of infection or fever.  Pt educated that it could be her allergies considering the pollen count has been high and to take OTC Claritin or zyrtec and an OTC cold medication such as sudafed and to follow the directions on the package as to when and how to take it.  Pt verbalized understanding.

## 2018-06-12 ENCOUNTER — Inpatient Hospital Stay: Payer: Medicare Other

## 2018-06-12 ENCOUNTER — Other Ambulatory Visit: Payer: Self-pay

## 2018-06-12 ENCOUNTER — Inpatient Hospital Stay: Payer: Medicare Other | Attending: Oncology

## 2018-06-12 VITALS — BP 124/78 | HR 72 | Temp 98.7°F | Resp 18 | Wt 121.8 lb

## 2018-06-12 DIAGNOSIS — Z170 Estrogen receptor positive status [ER+]: Principal | ICD-10-CM

## 2018-06-12 DIAGNOSIS — Z5112 Encounter for antineoplastic immunotherapy: Secondary | ICD-10-CM | POA: Insufficient documentation

## 2018-06-12 DIAGNOSIS — C50411 Malignant neoplasm of upper-outer quadrant of right female breast: Secondary | ICD-10-CM

## 2018-06-12 DIAGNOSIS — Z95828 Presence of other vascular implants and grafts: Secondary | ICD-10-CM

## 2018-06-12 LAB — CBC WITH DIFFERENTIAL (CANCER CENTER ONLY)
Abs Immature Granulocytes: 0.04 10*3/uL (ref 0.00–0.07)
Basophils Absolute: 0 10*3/uL (ref 0.0–0.1)
Basophils Relative: 0 %
Eosinophils Absolute: 0.2 10*3/uL (ref 0.0–0.5)
Eosinophils Relative: 2 %
HCT: 36.2 % (ref 36.0–46.0)
Hemoglobin: 10.5 g/dL — ABNORMAL LOW (ref 12.0–15.0)
Immature Granulocytes: 1 %
Lymphocytes Relative: 24 %
Lymphs Abs: 2.1 10*3/uL (ref 0.7–4.0)
MCH: 25.2 pg — ABNORMAL LOW (ref 26.0–34.0)
MCHC: 29 g/dL — ABNORMAL LOW (ref 30.0–36.0)
MCV: 87 fL (ref 80.0–100.0)
Monocytes Absolute: 0.8 10*3/uL (ref 0.1–1.0)
Monocytes Relative: 10 %
Neutro Abs: 5.5 10*3/uL (ref 1.7–7.7)
Neutrophils Relative %: 63 %
Platelet Count: 358 10*3/uL (ref 150–400)
RBC: 4.16 MIL/uL (ref 3.87–5.11)
RDW: 23.7 % — ABNORMAL HIGH (ref 11.5–15.5)
WBC Count: 8.7 10*3/uL (ref 4.0–10.5)
nRBC: 0 % (ref 0.0–0.2)

## 2018-06-12 LAB — CMP (CANCER CENTER ONLY)
ALT: 11 U/L (ref 0–44)
AST: 12 U/L — ABNORMAL LOW (ref 15–41)
Albumin: 3.7 g/dL (ref 3.5–5.0)
Alkaline Phosphatase: 121 U/L (ref 38–126)
Anion gap: 8 (ref 5–15)
BUN: 25 mg/dL — ABNORMAL HIGH (ref 8–23)
CO2: 26 mmol/L (ref 22–32)
Calcium: 9.7 mg/dL (ref 8.9–10.3)
Chloride: 105 mmol/L (ref 98–111)
Creatinine: 2.21 mg/dL — ABNORMAL HIGH (ref 0.44–1.00)
GFR, Est AFR Am: 25 mL/min — ABNORMAL LOW (ref >60)
GFR, Estimated: 22 mL/min — ABNORMAL LOW (ref >60)
Glucose, Bld: 76 mg/dL (ref 70–99)
Potassium: 4.8 mmol/L (ref 3.5–5.1)
Sodium: 139 mmol/L (ref 135–145)
Total Bilirubin: 0.4 mg/dL (ref 0.3–1.2)
Total Protein: 9.3 g/dL — ABNORMAL HIGH (ref 6.5–8.1)

## 2018-06-12 MED ORDER — DIPHENHYDRAMINE HCL 25 MG PO CAPS
50.0000 mg | ORAL_CAPSULE | Freq: Once | ORAL | Status: AC
  Administered 2018-06-12: 15:00:00 50 mg via ORAL

## 2018-06-12 MED ORDER — SODIUM CHLORIDE 0.9 % IV SOLN
Freq: Once | INTRAVENOUS | Status: AC
  Administered 2018-06-12: 15:00:00 via INTRAVENOUS
  Filled 2018-06-12: qty 250

## 2018-06-12 MED ORDER — ACETAMINOPHEN 325 MG PO TABS
ORAL_TABLET | ORAL | Status: AC
  Filled 2018-06-12: qty 2

## 2018-06-12 MED ORDER — TRASTUZUMAB CHEMO 150 MG IV SOLR
6.0000 mg/kg | Freq: Once | INTRAVENOUS | Status: AC
  Administered 2018-06-12: 16:00:00 357 mg via INTRAVENOUS
  Filled 2018-06-12: qty 17

## 2018-06-12 MED ORDER — SODIUM CHLORIDE 0.9% FLUSH
10.0000 mL | INTRAVENOUS | Status: DC | PRN
  Administered 2018-06-12: 10 mL
  Filled 2018-06-12: qty 10

## 2018-06-12 MED ORDER — SODIUM CHLORIDE 0.9% FLUSH
10.0000 mL | INTRAVENOUS | Status: DC | PRN
  Administered 2018-06-12: 16:00:00 10 mL
  Filled 2018-06-12: qty 10

## 2018-06-12 MED ORDER — DIPHENHYDRAMINE HCL 25 MG PO CAPS
ORAL_CAPSULE | ORAL | Status: AC
  Filled 2018-06-12: qty 2

## 2018-06-12 MED ORDER — ACETAMINOPHEN 325 MG PO TABS
650.0000 mg | ORAL_TABLET | Freq: Once | ORAL | Status: AC
  Administered 2018-06-12: 15:00:00 650 mg via ORAL

## 2018-06-12 MED ORDER — HEPARIN SOD (PORK) LOCK FLUSH 100 UNIT/ML IV SOLN
500.0000 [IU] | Freq: Once | INTRAVENOUS | Status: AC | PRN
  Administered 2018-06-12: 16:00:00 500 [IU]
  Filled 2018-06-12: qty 5

## 2018-06-12 MED ORDER — ALBUTEROL SULFATE (2.5 MG/3ML) 0.083% IN NEBU: 2.5000 mg | INHALATION_SOLUTION | Freq: Four times a day (QID) | RESPIRATORY_TRACT | 0 refills | Status: DC | PRN

## 2018-06-12 NOTE — Progress Notes (Signed)
Ok per Dr. Lindi Adie for courtesy refill of Albuterol.

## 2018-06-12 NOTE — Patient Instructions (Signed)
[facility] Discharge Instructions for Patients Receiving Chemotherapy  Today you received the following chemotherapy agents:  Herceptin.  To help prevent nausea and vomiting after your treatment, we encourage you to take your nausea medication as directed.   If you develop nausea and vomiting that is not controlled by your nausea medication, call the clinic.   BELOW ARE SYMPTOMS THAT SHOULD BE REPORTED IMMEDIATELY:  *FEVER GREATER THAN 100.5 F  *CHILLS WITH OR WITHOUT FEVER  NAUSEA AND VOMITING THAT IS NOT CONTROLLED WITH YOUR NAUSEA MEDICATION  *UNUSUAL SHORTNESS OF BREATH  *UNUSUAL BRUISING OR BLEEDING  TENDERNESS IN MOUTH AND THROAT WITH OR WITHOUT PRESENCE OF ULCERS  *URINARY PROBLEMS  *BOWEL PROBLEMS  UNUSUAL RASH Items with * indicate a potential emergency and should be followed up as soon as possible.  Feel free to call the clinic should you have any questions or concerns. The clinic phone number is [phone].  Please show the [place] at check-in to the Emergency Department and triage nurse. This information is directly available on the CDC website: RunningShows.co.za.html    Source:CDC Reference to specific commercial products, manufacturers, companies, or trademarks does not constitute its endorsement or recommendation by the [place], [place], or Centers for [employer] and [employer].

## 2018-06-16 ENCOUNTER — Telehealth: Payer: Self-pay

## 2018-06-16 MED ORDER — ONDANSETRON HCL 8 MG PO TABS: 8.0000 mg | ORAL_TABLET | Freq: Three times a day (TID) | ORAL | 0 refills | Status: DC | PRN

## 2018-06-16 NOTE — Telephone Encounter (Signed)
Nurse returned call to patient regarding refill.  Pt requesting refill on Zofran, reports mild increase in nausea over past couple days. No fever.  Pt continues to tolerate fluids and small meals well.  Rx sent, nurse encouraged pt to follow up if nausea does not subside with Zofran.  Voiced understanding.

## 2018-06-26 NOTE — Assessment & Plan Note (Signed)
11/27/2017 right mastectomy: IDC grade 2, 1.6 cm, separate foci of DCIS intermediate grade, margins negative, 0/5 lymph nodes negative,ER 100%, PR 50%, Ki-67 15%, HER-2 positive ratio 2, copy #5 T1c N0 stage Ia  Treatment plan: 1. Adjuvant Herceptin every 3 weeks for 1 year (because of poor performance status Taxol is felt to be intolerable for her) 2. Adjuvant antiestrogen therapy with letrozole 2.5 mg daily  Patient's daughter is the primary point of contact for her and all appointment changes will need to go through her. -------------------------------------------------------------------------------------- Current treatment:Herceptin with anastrozole started 02/07/2018 Echo 02/17/2018 EF 55-60%--followed by Bensimhon  Tolerating anastrozole and Herceptin well. Will continue this.  Severe anemia: GivenPRBCs previously.  Today's hemoglobin is . Itching sensation on the right chest wall: I instructed her to apply moisturizing cream. Soreness around the port  Return to clinic in 3 weeks for Herceptin every 6 weeks for follow-up with me

## 2018-07-02 ENCOUNTER — Other Ambulatory Visit: Payer: Self-pay | Admitting: Hematology and Oncology

## 2018-07-02 NOTE — Progress Notes (Signed)
 Patient Care Team: Richardson, Derek Michael, MD as PCP - General (Internal Medicine)  DIAGNOSIS:    ICD-10-CM   1. Malignant neoplasm of upper-outer quadrant of right breast in female, estrogen receptor positive (HCC) C50.411    Z17.0     SUMMARY OF ONCOLOGIC HISTORY:   Malignant neoplasm of upper-outer quadrant of right breast in female, estrogen receptor positive (HCC)   09/03/2017 Initial Diagnosis    Screening mammogram detected 1.4 cm right breast mass at 10 o'clock position, additional pleomorphic calcifications 2 cm posteriorly, additional loosely grouped calcifications 10.1 x 7.1 x 5.3 cm, single right axillary lymph node: Biopsy revealed DCIS ER 100%, PR 50%; IDC grade 2-3 ER 100%, PR 50%, Ki-67 15%, HER-2 positive ratio 2, copy #5, lymph node negative T2 N0 stage Ia clinical stage AJCC 8    09/03/2017 Cancer Staging    Staging form: Breast, AJCC 8th Edition - Clinical stage from 09/03/2017: Stage IB (cT2, cN0, cM0, G3, ER+, PR+, HER2+) - Signed by Causey, Lindsey Cornetto, NP on 04/10/2018    11/27/2017 Surgery    Right mastectomy: IDC grade 2, 1.6 cm, separate foci of DCIS intermediate grade, margins negative, 0/5 lymph nodes negative,ER 100%, PR 50%, Ki-67 15%, HER-2 positive ratio 2, copy #5 T1c N0 stage Ia    11/27/2017 Cancer Staging    Staging form: Breast, AJCC 8th Edition - Pathologic stage from 11/27/2017: Stage IA (pT1c, pN0, cM0, G2, ER+, PR+, HER2+) - Signed by Causey, Lindsey Cornetto, NP on 04/10/2018    02/07/2018 -  Chemotherapy    The patient had trastuzumab (HERCEPTIN) 483 mg in sodium chloride 0.9 % 250 mL chemo infusion, 8 mg/kg = 483 mg, Intravenous,  Once, 7 of 18 cycles Administration: 483 mg (02/07/2018), 357 mg (02/27/2018), 357 mg (05/01/2018), 357 mg (05/23/2018), 357 mg (06/12/2018), 357 mg (03/20/2018), 357 mg (04/10/2018)  for chemotherapy treatment.     02/27/2018 -  Anti-estrogen oral therapy    Anastrozole 1mg daily     CHIEF COMPLIANT: Follow-up on  Herceptin with anastrozole  INTERVAL HISTORY: [name] is a 68 y.o. with above-mentioned history of right breast cancer who is currently on adjuvant therapy with Herceptin along with anastrozole. She received IV Feraheme on 05/23/18 and 05/29/18 following due to a Hg of 9.0 and hematocrit of 32.8. She presents to the clinic today for treatment.  She tells me that she gets intermittent diarrhea.  She has had diarrhea even prior to infusion starting.  She has good energy levels and appears to be tolerating the treatment very well.  REVIEW OF SYSTEMS:   Constitutional: Denies fevers, chills or abnormal weight loss Eyes: Denies blurriness of vision Ears, nose, mouth, throat, and face: Denies mucositis or sore throat Respiratory: Denies cough, dyspnea or wheezes Cardiovascular: Denies palpitation, chest discomfort Gastrointestinal: Intermittent diarrhea Skin: Denies abnormal skin rashes Lymphatics: Denies new lymphadenopathy or easy bruising Neurological: Denies numbness, tingling or new weaknesses Behavioral/Psych: Mood is stable, no new changes  Extremities: No lower extremity edema Breast: denies any pain or lumps or nodules in either breasts All other systems were reviewed with the patient and are negative.  I have reviewed the past medical history, past surgical history, social history and family history with the patient and they are unchanged from previous note.  ALLERGIES:  has No Known Allergies.  MEDICATIONS:  Current Outpatient Medications  Medication Sig Dispense Refill  . albuterol (PROVENTIL) (2.5 MG/3ML) 0.083% nebulizer solution INHALE THE CONTENTS OF 1 VIAL USING NEBULIZER EVERY 6   HOURS AS NEEDEDFOR WHEEZING OR SHORTNESS OF BREATH 75 mL 0  . ALPRAZolam (XANAX) 0.25 MG tablet Take 0.25 mg by mouth 2 (two) times daily.     Marland Kitchen amLODipine (NORVASC) 5 MG tablet Take 5 mg by mouth daily.    Marland Kitchen anastrozole (ARIMIDEX) 1 MG tablet Take 1 tablet (1 mg total) by mouth daily. 90  tablet 3  . aspirin EC 81 MG EC tablet Take 1 tablet (81 mg total) by mouth daily. 30 tablet 0  . atorvastatin (LIPITOR) 20 MG tablet Take 20 mg by mouth daily.    . budesonide-formoterol (SYMBICORT) 160-4.5 MCG/ACT inhaler Inhale 1 puff into the lungs 2 (two) times daily.     . feeding supplement, ENSURE ENLIVE, (ENSURE ENLIVE) LIQD Take 237 mLs by mouth 2 (two) times daily between meals. 60 Bottle 0  . HYDROcodone-acetaminophen (NORCO/VICODIN) 5-325 MG tablet Take 1 tablet by mouth every 4 (four) hours as needed. (Patient taking differently: Take 1 tablet by mouth every 4 (four) hours as needed for moderate pain. ) 10 tablet 0  . lidocaine-prilocaine (EMLA) cream Apply to affected area once (Patient not taking: Reported on 04/10/2018) 30 g 3  . loperamide (IMODIUM) 2 MG capsule Take 1 capsule (2 mg total) by mouth 4 (four) times daily as needed for diarrhea or loose stools. 12 capsule 0  . metFORMIN (GLUCOPHAGE) 1000 MG tablet Take 500 mg by mouth 2 (two) times daily with a meal.     . metoprolol succinate (TOPROL-XL) 50 MG 24 hr tablet Take 50 mg by mouth at bedtime. Take with or immediately following a meal.    . nitroGLYCERIN (NITROSTAT) 0.4 MG SL tablet Place 1 tablet (0.4 mg total) under the tongue every 5 (five) minutes as needed. For chest pain (Patient taking differently: Place 0.4 mg under the tongue every 5 (five) minutes as needed for chest pain. ) 10 tablet 0  . Omega-3 Fatty Acids (FISH OIL) 1000 MG CAPS Take 1,000 mg by mouth daily.    . ondansetron (ZOFRAN) 8 MG tablet Take 1 tablet (8 mg total) by mouth every 8 (eight) hours as needed for nausea or vomiting. 20 tablet 0  . pantoprazole (PROTONIX) 40 MG tablet Take 1 tablet (40 mg total) by mouth daily. 60 tablet 0   No current facility-administered medications for this visit.     PHYSICAL EXAMINATION: ECOG PERFORMANCE STATUS: 1 - Symptomatic but completely ambulatory  Vitals:   07/03/18 1352  BP: 121/68  Pulse: 79  Resp: 18   Temp: 97.8 F (36.6 C)  SpO2: 100%   Filed Weights   07/03/18 1352  Weight: 129 lb 1.6 oz (58.6 kg)    GENERAL: alert, no distress and comfortable SKIN: skin color, texture, turgor are normal, no rashes or significant lesions EYES: normal, Conjunctiva are pink and non-injected, sclera clear OROPHARYNX: no exudate, no erythema and lips, buccal mucosa, and tongue normal  NECK: supple, thyroid normal size, non-tender, without nodularity LYMPH: no palpable lymphadenopathy in the cervical, axillary or inguinal LUNGS: clear to auscultation and percussion with normal breathing effort HEART: regular rate & rhythm and no murmurs and no lower extremity edema ABDOMEN: abdomen soft, non-tender and normal bowel sounds MUSCULOSKELETAL: no cyanosis of digits and no clubbing  NEURO: alert & oriented x 3 with fluent speech, no focal motor/sensory deficits EXTREMITIES: No lower extremity edema  LABORATORY DATA:  I have reviewed the data as listed CMP Latest Ref Rng & Units 06/12/2018 05/23/2018 05/01/2018  Glucose 70 - 99  mg/dL 76 88 89  BUN 8 - 23 mg/dL 25(H) 23 21  Creatinine 0.44 - 1.00 mg/dL 2.21(H) 2.04(H) 1.75(H)  Sodium 135 - 145 mmol/L 139 139 139  Potassium 3.5 - 5.1 mmol/L 4.8 5.1 4.9  Chloride 98 - 111 mmol/L 105 104 105  CO2 22 - 32 mmol/L 26 26 25  Calcium 8.9 - 10.3 mg/dL 9.7 10.4(H) 9.5  Total Protein 6.5 - 8.1 g/dL 9.3(H) 9.0(H) 9.0(H)  Total Bilirubin 0.3 - 1.2 mg/dL 0.4 0.5 0.7  Alkaline Phos 38 - 126 U/L 121 134(H) 146(H)  AST 15 - 41 U/L 12(L) 16 12(L)  ALT 0 - 44 U/L 11 10 11    Lab Results  Component Value Date   WBC 6.5 07/03/2018   HGB 10.4 (L) 07/03/2018   HCT 35.7 (L) 07/03/2018   MCV 92.0 07/03/2018   PLT 316 07/03/2018   NEUTROABS 3.2 07/03/2018    ASSESSMENT & PLAN:  Malignant neoplasm of upper-outer quadrant of right breast in female, estrogen receptor positive (HCC) 11/27/2017 right mastectomy: IDC grade 2, 1.6 cm, separate foci of DCIS intermediate  grade, margins negative, 0/5 lymph nodes negative,ER 100%, PR 50%, Ki-67 15%, HER-2 positive ratio 2, copy #5 T1c N0 stage Ia  Treatment plan: 1. Adjuvant Herceptin every 3 weeks for 1 year (because of poor performance status Taxol is felt to be intolerable for her) 2. Adjuvant antiestrogen therapy with letrozole 2.5 mg daily  Patient's daughter is the primary point of contact for her and all appointment changes will need to go through her. -------------------------------------------------------------------------------------- Current treatment:Herceptin with anastrozole started 02/07/2018 Echo 02/17/2018 EF 55-60%--followed by [facility]  Tolerating anastrozole and Herceptin well. Will continue this.  Severe anemia: GivenPRBCs previously.  Today's hemoglobin is 10.4. Itching sensation on the right chest wall: I instructed her to apply moisturizing cream. Soreness around the port Numbness in the right axillary area: I discussed with her that this can stay for a long time.  Return to clinic in 3 weeks for Herceptin every 6 weeks for follow-up with me  No orders of the defined types were placed in this encounter.  The patient has a good understanding of the overall plan. she agrees with it. she will call with any problems that may develop before the next visit here.  Gudena, Vinay, MD 07/03/2018  I, Molly Dorshimer am acting as scribe for Dr. Vinay Gudena.  I have reviewed the above documentation for accuracy and completeness, and I agree with the above.       

## 2018-07-03 ENCOUNTER — Inpatient Hospital Stay: Payer: Medicare Other

## 2018-07-03 ENCOUNTER — Other Ambulatory Visit: Payer: Self-pay

## 2018-07-03 ENCOUNTER — Inpatient Hospital Stay (HOSPITAL_BASED_OUTPATIENT_CLINIC_OR_DEPARTMENT_OTHER): Payer: Medicare Other | Admitting: Hematology and Oncology

## 2018-07-03 DIAGNOSIS — Z9011 Acquired absence of right breast and nipple: Secondary | ICD-10-CM

## 2018-07-03 DIAGNOSIS — R197 Diarrhea, unspecified: Secondary | ICD-10-CM

## 2018-07-03 DIAGNOSIS — Z170 Estrogen receptor positive status [ER+]: Secondary | ICD-10-CM

## 2018-07-03 DIAGNOSIS — Z5112 Encounter for antineoplastic immunotherapy: Secondary | ICD-10-CM | POA: Diagnosis not present

## 2018-07-03 DIAGNOSIS — L299 Pruritus, unspecified: Secondary | ICD-10-CM | POA: Diagnosis not present

## 2018-07-03 DIAGNOSIS — D649 Anemia, unspecified: Secondary | ICD-10-CM | POA: Diagnosis not present

## 2018-07-03 DIAGNOSIS — Z95828 Presence of other vascular implants and grafts: Secondary | ICD-10-CM

## 2018-07-03 DIAGNOSIS — R209 Unspecified disturbances of skin sensation: Secondary | ICD-10-CM

## 2018-07-03 DIAGNOSIS — C50411 Malignant neoplasm of upper-outer quadrant of right female breast: Secondary | ICD-10-CM

## 2018-07-03 DIAGNOSIS — Z79811 Long term (current) use of aromatase inhibitors: Secondary | ICD-10-CM

## 2018-07-03 DIAGNOSIS — Z79899 Other long term (current) drug therapy: Secondary | ICD-10-CM

## 2018-07-03 LAB — CMP (CANCER CENTER ONLY)
ALT: 23 U/L (ref 0–44)
AST: 15 U/L (ref 15–41)
Albumin: 3.5 g/dL (ref 3.5–5.0)
Alkaline Phosphatase: 108 U/L (ref 38–126)
Anion gap: 8 (ref 5–15)
BUN: 25 mg/dL — ABNORMAL HIGH (ref 8–23)
CO2: 27 mmol/L (ref 22–32)
Calcium: 9.8 mg/dL (ref 8.9–10.3)
Chloride: 105 mmol/L (ref 98–111)
Creatinine: 2.26 mg/dL — ABNORMAL HIGH (ref 0.44–1.00)
GFR, Est AFR Am: 25 mL/min — ABNORMAL LOW (ref >60)
GFR, Estimated: 21 mL/min — ABNORMAL LOW (ref >60)
Glucose, Bld: 112 mg/dL — ABNORMAL HIGH (ref 70–99)
Potassium: 4.4 mmol/L (ref 3.5–5.1)
Sodium: 140 mmol/L (ref 135–145)
Total Bilirubin: 0.3 mg/dL (ref 0.3–1.2)
Total Protein: 8.8 g/dL — ABNORMAL HIGH (ref 6.5–8.1)

## 2018-07-03 LAB — CBC WITH DIFFERENTIAL (CANCER CENTER ONLY)
Abs Immature Granulocytes: 0.07 10*3/uL (ref 0.00–0.07)
Basophils Absolute: 0 10*3/uL (ref 0.0–0.1)
Basophils Relative: 1 %
Eosinophils Absolute: 0.2 10*3/uL (ref 0.0–0.5)
Eosinophils Relative: 3 %
HCT: 35.7 % — ABNORMAL LOW (ref 36.0–46.0)
Hemoglobin: 10.4 g/dL — ABNORMAL LOW (ref 12.0–15.0)
Immature Granulocytes: 1 %
Lymphocytes Relative: 34 %
Lymphs Abs: 2.2 10*3/uL (ref 0.7–4.0)
MCH: 26.8 pg (ref 26.0–34.0)
MCHC: 29.1 g/dL — ABNORMAL LOW (ref 30.0–36.0)
MCV: 92 fL (ref 80.0–100.0)
Monocytes Absolute: 0.9 10*3/uL (ref 0.1–1.0)
Monocytes Relative: 13 %
Neutro Abs: 3.2 10*3/uL (ref 1.7–7.7)
Neutrophils Relative %: 48 %
Platelet Count: 316 10*3/uL (ref 150–400)
RBC: 3.88 MIL/uL (ref 3.87–5.11)
RDW: 22.5 % — ABNORMAL HIGH (ref 11.5–15.5)
WBC Count: 6.5 10*3/uL (ref 4.0–10.5)
nRBC: 0 % (ref 0.0–0.2)

## 2018-07-03 MED ORDER — SODIUM CHLORIDE 0.9% FLUSH
10.0000 mL | INTRAVENOUS | Status: DC | PRN
  Administered 2018-07-03: 10 mL
  Filled 2018-07-03: qty 10

## 2018-07-03 MED ORDER — SODIUM CHLORIDE 0.9% FLUSH
10.0000 mL | INTRAVENOUS | Status: DC | PRN
  Administered 2018-07-03: 16:00:00 10 mL
  Filled 2018-07-03: qty 10

## 2018-07-03 MED ORDER — SODIUM CHLORIDE 0.9 % IV SOLN
Freq: Once | INTRAVENOUS | Status: AC
  Administered 2018-07-03: 15:00:00 via INTRAVENOUS
  Filled 2018-07-03: qty 250

## 2018-07-03 MED ORDER — HEPARIN SOD (PORK) LOCK FLUSH 100 UNIT/ML IV SOLN
500.0000 [IU] | Freq: Once | INTRAVENOUS | Status: AC | PRN
  Administered 2018-07-03: 500 [IU]
  Filled 2018-07-03: qty 5

## 2018-07-03 MED ORDER — DIPHENHYDRAMINE HCL 25 MG PO CAPS
50.0000 mg | ORAL_CAPSULE | Freq: Once | ORAL | Status: AC
  Administered 2018-07-03: 15:00:00 50 mg via ORAL

## 2018-07-03 MED ORDER — DIPHENHYDRAMINE HCL 25 MG PO CAPS
ORAL_CAPSULE | ORAL | Status: AC
  Filled 2018-07-03: qty 2

## 2018-07-03 MED ORDER — ACETAMINOPHEN 325 MG PO TABS
ORAL_TABLET | ORAL | Status: AC
  Filled 2018-07-03: qty 2

## 2018-07-03 MED ORDER — ACETAMINOPHEN 325 MG PO TABS
650.0000 mg | ORAL_TABLET | Freq: Once | ORAL | Status: AC
  Administered 2018-07-03: 650 mg via ORAL

## 2018-07-03 MED ORDER — TRASTUZUMAB CHEMO 150 MG IV SOLR
6.0000 mg/kg | Freq: Once | INTRAVENOUS | Status: AC
  Administered 2018-07-03: 15:00:00 357 mg via INTRAVENOUS
  Filled 2018-07-03: qty 17

## 2018-07-03 NOTE — Patient Instructions (Signed)
[facility] Discharge Instructions for Patients Receiving Chemotherapy  Today you received the following chemotherapy agents Herceptin  To help prevent nausea and vomiting after your treatment, we encourage you to take your nausea medication as directed   If you develop nausea and vomiting that is not controlled by your nausea medication, call the clinic.   BELOW ARE SYMPTOMS THAT SHOULD BE REPORTED IMMEDIATELY:  *FEVER GREATER THAN 100.5 F  *CHILLS WITH OR WITHOUT FEVER  NAUSEA AND VOMITING THAT IS NOT CONTROLLED WITH YOUR NAUSEA MEDICATION  *UNUSUAL SHORTNESS OF BREATH  *UNUSUAL BRUISING OR BLEEDING  TENDERNESS IN MOUTH AND THROAT WITH OR WITHOUT PRESENCE OF ULCERS  *URINARY PROBLEMS  *BOWEL PROBLEMS  UNUSUAL RASH Items with * indicate a potential emergency and should be followed up as soon as possible.  Feel free to call the clinic should you have any questions or concerns. The clinic phone number is [phone].  Please show the CHEMO ALERT CARD at check-in to the Emergency Department and triage nurse.   

## 2018-07-10 ENCOUNTER — Encounter (HOSPITAL_COMMUNITY): Payer: Medicare Other | Admitting: Internal Medicine

## 2018-07-10 ENCOUNTER — Ambulatory Visit (HOSPITAL_COMMUNITY)
Admission: RE | Admit: 2018-07-10 | Discharge: 2018-07-10 | Disposition: A | Discharge status: Home / Self Care | Payer: Medicare Other | Source: Ambulatory Visit | Attending: Internal Medicine | Admitting: Internal Medicine

## 2018-07-10 ENCOUNTER — Other Ambulatory Visit: Payer: Self-pay

## 2018-07-10 DIAGNOSIS — I351 Nonrheumatic aortic (valve) insufficiency: Secondary | ICD-10-CM | POA: Insufficient documentation

## 2018-07-10 DIAGNOSIS — I10 Essential (primary) hypertension: Secondary | ICD-10-CM | POA: Insufficient documentation

## 2018-07-10 DIAGNOSIS — E785 Hyperlipidemia, unspecified: Secondary | ICD-10-CM | POA: Insufficient documentation

## 2018-07-10 DIAGNOSIS — E119 Type 2 diabetes mellitus without complications: Secondary | ICD-10-CM | POA: Diagnosis not present

## 2018-07-10 DIAGNOSIS — C50811 Malignant neoplasm of overlapping sites of right female breast: Secondary | ICD-10-CM | POA: Insufficient documentation

## 2018-07-10 DIAGNOSIS — Z0189 Encounter for other specified special examinations: Secondary | ICD-10-CM

## 2018-07-10 NOTE — Progress Notes (Signed)
  Echocardiogram 2D Echocardiogram has been performed.  [name] 07/10/2018, 2:05 PM

## 2018-07-11 ENCOUNTER — Ambulatory Visit (HOSPITAL_COMMUNITY)
Admission: RE | Admit: 2018-07-11 | Discharge: 2018-07-11 | Disposition: A | Discharge status: Home / Self Care | Payer: Medicare Other | Source: Ambulatory Visit | Attending: Internal Medicine | Admitting: Internal Medicine

## 2018-07-11 DIAGNOSIS — I2510 Atherosclerotic heart disease of native coronary artery without angina pectoris: Secondary | ICD-10-CM | POA: Diagnosis not present

## 2018-07-11 DIAGNOSIS — C50411 Malignant neoplasm of upper-outer quadrant of right female breast: Secondary | ICD-10-CM

## 2018-07-11 DIAGNOSIS — Z170 Estrogen receptor positive status [ER+]: Secondary | ICD-10-CM

## 2018-07-11 NOTE — Addendum Note (Signed)
Encounter addended by: Marlise Eves, RN on: 07/11/2018 4:00 PM  Actions taken: Order list changed, Diagnosis association updated, Clinical Note Signed

## 2018-07-11 NOTE — Patient Instructions (Signed)
Your physician has requested that you have an echocardiogram. Echocardiography is a painless test that uses sound waves to create images of your heart. It provides your doctor with information about the size and shape of your heart and how well your heart's chambers and valves are working. This procedure takes approximately one hour. There are no restrictions for this procedure. This will be at your follow up appointment with Dr. Haroldine Laws.  Please follow up with Dr. Haroldine Laws in 2 months.

## 2018-07-11 NOTE — Progress Notes (Signed)
Repeat echo with visit in 2 months    Placed orders and sent to scheduler.

## 2018-07-11 NOTE — Progress Notes (Signed)
Heart Failure TeleHealth Note  Due to national recommendations of social distancing due to [place] 19, Audio/video telehealth visit is felt to be most appropriate for this patient at this time.  See MyChart message from today for patient consent regarding telehealth for [facility].  Date:  07/11/2018   ID:  [name], DOB [birth year], MRN [id number]  Location: Home  Provider location: [facility] Type of Visit: Established patient  PCP:  Tsosie Billing, MD  Cardiologist:  No primary care provider on file. Primary HF: Tiki Tucciarone  Chief Complaint: Cardio-oncology follow-up   History of Present Illness:  [name] is a 68 y.o. female with past medical history of R breast cancer, CAD s/p RCA and OM stent followed by Dr. Dominica Severin who has been referred by Dr. Lindi Adie to establish in the cardio-oncology clinic for monitoring of cardio-toxicity while undergoing chemotherapy for her R breast CA..      Malignant neoplasm of upper-outer quadrant of right breast in female, estrogen receptor positive ([place])   09/03/2017 Initial Diagnosis    Screening mammogram detected 1.4 cm right breast mass at 10 o'clock position, additional pleomorphic calcifications 2 cm posteriorly, additional loosely grouped calcifications 10.1 x 7.1 x 5.3 cm, single right axillary lymph node: Biopsy revealed DCIS ER 100%, PR 50%; IDC grade 2-3 ER 100%, PR 50%, Ki-67 15%, HER-2 positive ratio 2, copy #5, lymph node negative T2 N0 stage Ia clinical stage AJCC 8    11/27/2017 Surgery    Right mastectomy: IDC grade 2, 1.6 cm, separate foci of DCIS intermediate grade, margins negative, 0/5 lymph nodes negative,ER 100%, PR 50%, Ki-67 15%, HER-2 positive ratio 2, copy #5 T1c N0 stage Ia   Treatment Plan  Herceptin q 3 weeks x 1 year. To finish 01/2019 if stays on schedule.    She presents via Engineer, civil (consulting) for a telehealth visit today.  She is a poor  historian. She is feeling OK overall. She had to stop Taxol due to intolerance. She states it made her feel bad and feel numb in her legs and arms.  She has a h/o CAD and a previous stent and is followed by Dr. Terrence Dupont.  She seems to have frequent CP  that "comes and goes", and occasionally radiates into her left or right side. She remains on Herceptin q 3 weeks.  Denies edema, orthopnea or PND.   Echo 07/10/18 EF 50-55% GLS inaccurate  Echo 1/20  LVEF 55-60%, mild AI, Grade 1 DD, -16.4  Echo 11/26/17 LVEF 50-55%, Grade 1 DD, Mild/mod AI, GLS - 14.55  Stress test 04/20/2016 (Most recent) 1. No reversible ischemia or infarction. 2. Normal left ventricular wall motion. 3. Left ventricular ejection fraction 54% 4. Non invasive risk stratification*: Low  LHC 02/20/2011 - LV showed mild global hypokinesia, EF of approximately 50%. - Left main was patent. LAD has 10-15% proximal stenosis.  -Diagonal 1 has 20% ostial stenosis.Diagonal 2 is very small.  - Left circumflex has 40-50% proximal sequential stenosis. Stented midportion is patent.  - OM 1 and OM 2 were very, very small. OM 3 and 4 were small which were patent.  - RCA was patent at prior PTCA stenting site. Aortography showed no aortic aneurysm.  - Right renal artery has 60-70% ostial stenosis. Left renal artery is patent.     [name] denies symptoms worrisome for COVID 19.   Past Medical History:  Diagnosis Date  . Angina   .  Arthritis    HANDS"  . Asthma   . Cancer ([place]) 09/04/2017   Right Breast  . Carpal tunnel syndrome, bilateral 11/26/2016  . Cholelithiasis   . Cocaine abuse ([place]) 07/2012   per E.R. drug screen  . Coronary artery disease   . Diabetes mellitus without mention of complication    type 2  . Dysphagia    esophageal dysmotility on 03/2011 esophagram   . Dyspnea    ongoing   . Fatty liver disease, nonalcoholic 5035   on Imaging.   Marland Kitchen GERD (gastroesophageal reflux disease)   .  Headache   . History of colonic polyps 2009   adenomatous 2009, HP in 2009, 2011, 2013.   Marland Kitchen Hyperlipidemia   . Hypertension   . Iron deficiency anemia   . Myocardial infarction ([place])    years ago, 6 stents placed  . 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 ABDOMINAL ANGIOGRAM  02/20/2011   Procedure: ABDOMINAL ANGIOGRAM;  Surgeon: Clent Demark, MD;  Location: [facility] CATH LAB;  Service: Cardiovascular;;  . CARPAL TUNNEL RELEASE Right 07/04/2017   Procedure: RIGHT CARPAL TUNNEL RELEASE;  Surgeon: Leanora Cover, MD;  Location: [facility];  Service: Orthopedics;  Laterality: Right;  . CARPAL TUNNEL RELEASE Left 09/12/2017   Procedure: LEFT CARPAL TUNNEL RELEASE;  Surgeon: Leanora Cover, MD;  Location: [facility];  Service: Orthopedics;  Laterality: Left;  . COLONOSCOPY  11/30/2011   Procedure: COLONOSCOPY;  Surgeon: Lafayette Dragon, MD;  Location: [facility] ENDOSCOPY;  Service: Endoscopy;  Laterality: N/A;  . CORONARY ANGIOPLASTY WITH STENT PLACEMENT    . ESOPHAGOGASTRODUODENOSCOPY  11/30/2011   Procedure: ESOPHAGOGASTRODUODENOSCOPY (EGD);  Surgeon: Lafayette Dragon, MD;  Location: [facility]OSCOPY;  Service: Endoscopy;  Laterality: N/A;  . ESOPHAGOGASTRODUODENOSCOPY N/A 02/23/2015   Procedure: ESOPHAGOGASTRODUODENOSCOPY (EGD);  Surgeon: Gatha Mayer, MD;  Location: [facility] ENDOSCOPY;  Service: Endoscopy;  Laterality: N/A;  . LEFT HEART CATHETERIZATION WITH CORONARY ANGIOGRAM N/A 02/20/2011   Procedure: LEFT HEART CATHETERIZATION WITH CORONARY ANGIOGRAM;  Surgeon: Clent Demark, MD;  Location: [facility] CATH LAB;  Service: Cardiovascular;  Laterality: N/A;  . MASTECTOMY Right 11/05/2017  . MASTECTOMY W/ SENTINEL NODE BIOPSY Right 11/27/2017  . MASTECTOMY W/ SENTINEL NODE BIOPSY Right 11/27/2017   Procedure: RIGHT TOTAL MASTECTOMY WITH SENTINEL LYMPH NODE BIOPSY;  Surgeon: Excell Seltzer, MD;  Location: [facility];  Service: General;   Laterality: Right;  . PORT A CATH REVISION Left 05/07/2018   Procedure: PORT A CATH REVISION;  Surgeon: Excell Seltzer, MD;  Location: [facility] ORS;  Service: General;  Laterality: Left;  . PORTACATH PLACEMENT Left 11/27/2017   Procedure: INSERTION PORT-A-CATH;  Surgeon: Excell Seltzer, MD;  Location: [facility];  Service: General;  Laterality: Left;  . RIGHT COLECTOMY  02/2007   for post polypectomy colonic perforation  . TUBAL LIGATION       Current Outpatient Medications  Medication Sig Dispense Refill  . albuterol (PROVENTIL) (2.5 MG/3ML) 0.083% nebulizer solution INHALE THE CONTENTS OF 1 VIAL USING NEBULIZER EVERY 6 HOURS AS NEEDEDFOR WHEEZING OR SHORTNESS OF BREATH 75 mL 0  . ALPRAZolam (XANAX) 0.25 MG tablet Take 0.25 mg by mouth 2 (two) times daily.     Marland Kitchen amLODipine (NORVASC) 5 MG tablet Take 5 mg by mouth daily.    Marland Kitchen anastrozole (ARIMIDEX) 1 MG tablet Take 1 tablet (1 mg total) by mouth daily. 90 tablet 3  . aspirin EC 81 MG  EC tablet Take 1 tablet (81 mg total) by mouth daily. 30 tablet 0  . atorvastatin (LIPITOR) 20 MG tablet Take 20 mg by mouth daily.    . budesonide-formoterol (SYMBICORT) 160-4.5 MCG/ACT inhaler Inhale 1 puff into the lungs 2 (two) times daily.     . feeding supplement, ENSURE ENLIVE, (ENSURE ENLIVE) LIQD Take 237 mLs by mouth 2 (two) times daily between meals. 60 Bottle 0  . HYDROcodone-acetaminophen (NORCO/VICODIN) 5-325 MG tablet Take 1 tablet by mouth every 4 (four) hours as needed. (Patient taking differently: Take 1 tablet by mouth every 4 (four) hours as needed for moderate pain. ) 10 tablet 0  . lidocaine-prilocaine (EMLA) cream Apply to affected area once (Patient not taking: Reported on 04/10/2018) 30 g 3  . loperamide (IMODIUM) 2 MG capsule Take 1 capsule (2 mg total) by mouth 4 (four) times daily as needed for diarrhea or loose stools. 12 capsule 0  . metFORMIN (GLUCOPHAGE) 1000 MG tablet Take 500 mg by mouth 2 (two) times daily with a meal.     . metoprolol  succinate (TOPROL-XL) 50 MG 24 hr tablet Take 50 mg by mouth at bedtime. Take with or immediately following a meal.    . nitroGLYCERIN (NITROSTAT) 0.4 MG SL tablet Place 1 tablet (0.4 mg total) under the tongue every 5 (five) minutes as needed. For chest pain (Patient taking differently: Place 0.4 mg under the tongue every 5 (five) minutes as needed for chest pain. ) 10 tablet 0  . Omega-3 Fatty Acids (FISH OIL) 1000 MG CAPS Take 1,000 mg by mouth daily.    . ondansetron (ZOFRAN) 8 MG tablet Take 1 tablet (8 mg total) by mouth every 8 (eight) hours as needed for nausea or vomiting. 20 tablet 0  . pantoprazole (PROTONIX) 40 MG tablet Take 1 tablet (40 mg total) by mouth daily. 60 tablet 0   No current facility-administered medications for this encounter.     Allergies:   Patient has no known allergies.   Social History:  The patient  reports that she quit smoking about 8 years ago. Her smoking use included cigarettes. She has a 23.50 pack-year smoking history. She has never used smokeless tobacco. She reports previous drug use. Drug: Cocaine. She reports that she does not drink alcohol.   Family History:  The patient's family history includes Cancer in her father; Diabetes in her mother and sister; Heart disease in her mother.   ROS:  Please see the history of present illness.   All other systems are personally reviewed and negative.   Exam:  (Video/Tele Health Call; Exam is subjective and or/visual.) General:  Speaks in full sentences. No resp difficulty. Lungs: Normal respiratory effort with conversation.  Abdomen: Non-distended per patient report Extremities: Pt denies edema. Neuro: Alert & oriented x 3.   Recent Labs: 07/03/2018: ALT 23; BUN 25; Creatinine 2.26; Hemoglobin 10.4; Platelet Count 316; Potassium 4.4; Sodium 140  Personally reviewed   Wt Readings from Last 3 Encounters:  07/03/18 58.6 kg (129 lb 1.6 oz)  06/12/18 55.2 kg (121 lb 12 oz)  05/23/18 55.7 kg (122 lb 11.2 oz)       ASSESSMENT AND PLAN:  1. Malignant neoplasm of upper-outer quadrant of right breast in female, estrogen receptor positive ([place]) - She is on Herceptin. Did not tolerate Taxol due to malaise and paraesthesias per patient report.  - Echo 07/10/18 EF 50-55% GLS inaccurate - Echo 1/20  LVEF 55-60%, mild AI, Grade 1 DD, -16.4 - Echo  11/26/17 LVEF 50-55%, Grade 1 DD, Mild/mod AI, GLS - 14.55 - EF on low end of normal today but similar to echo in 10/19. I am not convinced this is Herceptin toxicity. Can continue Herceptin. Repeat echo in 2 months  2. CAD - She has chronic attypical chest pain. - LHC 02/2011 with patent stents - Most recent ischemic work up 04/2016 Myoview with no reversible ischemia or infarction.  - follows with Dr. Terrence Dupont    COVID screen The patient does not have any symptoms that suggest any further testing/ screening at this time.  Social distancing reinforced today.  Recommended follow-up:  As above  Relevant cardiac medications were reviewed at length with the patient today.   The patient does not have concerns regarding their medications at this time.   The following changes were made today:  As above  Today, I have spent 14 minutes with the patient with telehealth technology discussing the above issues .    Signed, Glori Bickers, MD  07/11/2018 3:34 PM  [facility] [address] Heart and [place] [address] [phone] (office) [phone] (fax)

## 2018-07-12 ENCOUNTER — Emergency Department (HOSPITAL_COMMUNITY): Payer: Medicare Other

## 2018-07-12 ENCOUNTER — Other Ambulatory Visit (HOSPITAL_COMMUNITY): Payer: Self-pay

## 2018-07-12 ENCOUNTER — Encounter (HOSPITAL_COMMUNITY): Payer: Self-pay | Admitting: *Deleted

## 2018-07-12 ENCOUNTER — Observation Stay (HOSPITAL_COMMUNITY)
Admission: EM | Admit: 2018-07-12 | Discharge: 2018-07-14 | Disposition: A | Discharge status: Home / Self Care | Payer: Medicare Other | Attending: Cardiology | Admitting: Cardiology

## 2018-07-12 ENCOUNTER — Other Ambulatory Visit: Payer: Self-pay

## 2018-07-12 DIAGNOSIS — Z853 Personal history of malignant neoplasm of breast: Secondary | ICD-10-CM | POA: Diagnosis not present

## 2018-07-12 DIAGNOSIS — C50411 Malignant neoplasm of upper-outer quadrant of right female breast: Secondary | ICD-10-CM | POA: Diagnosis not present

## 2018-07-12 DIAGNOSIS — Z955 Presence of coronary angioplasty implant and graft: Secondary | ICD-10-CM | POA: Insufficient documentation

## 2018-07-12 DIAGNOSIS — Z20828 Contact with and (suspected) exposure to other viral communicable diseases: Secondary | ICD-10-CM | POA: Insufficient documentation

## 2018-07-12 DIAGNOSIS — Z87891 Personal history of nicotine dependence: Secondary | ICD-10-CM | POA: Diagnosis not present

## 2018-07-12 DIAGNOSIS — E1122 Type 2 diabetes mellitus with diabetic chronic kidney disease: Secondary | ICD-10-CM | POA: Diagnosis not present

## 2018-07-12 DIAGNOSIS — K219 Gastro-esophageal reflux disease without esophagitis: Secondary | ICD-10-CM | POA: Insufficient documentation

## 2018-07-12 DIAGNOSIS — Z794 Long term (current) use of insulin: Secondary | ICD-10-CM | POA: Insufficient documentation

## 2018-07-12 DIAGNOSIS — R079 Chest pain, unspecified: Secondary | ICD-10-CM

## 2018-07-12 DIAGNOSIS — Z8249 Family history of ischemic heart disease and other diseases of the circulatory system: Secondary | ICD-10-CM | POA: Diagnosis not present

## 2018-07-12 DIAGNOSIS — D638 Anemia in other chronic diseases classified elsewhere: Secondary | ICD-10-CM | POA: Insufficient documentation

## 2018-07-12 DIAGNOSIS — J90 Pleural effusion, not elsewhere classified: Secondary | ICD-10-CM | POA: Insufficient documentation

## 2018-07-12 DIAGNOSIS — I129 Hypertensive chronic kidney disease with stage 1 through stage 4 chronic kidney disease, or unspecified chronic kidney disease: Secondary | ICD-10-CM | POA: Insufficient documentation

## 2018-07-12 DIAGNOSIS — Z170 Estrogen receptor positive status [ER+]: Secondary | ICD-10-CM | POA: Insufficient documentation

## 2018-07-12 DIAGNOSIS — E785 Hyperlipidemia, unspecified: Secondary | ICD-10-CM | POA: Insufficient documentation

## 2018-07-12 DIAGNOSIS — Z86010 Personal history of colonic polyps: Secondary | ICD-10-CM | POA: Insufficient documentation

## 2018-07-12 DIAGNOSIS — Z7982 Long term (current) use of aspirin: Secondary | ICD-10-CM | POA: Insufficient documentation

## 2018-07-12 DIAGNOSIS — Z79899 Other long term (current) drug therapy: Secondary | ICD-10-CM | POA: Diagnosis not present

## 2018-07-12 DIAGNOSIS — N183 Chronic kidney disease, stage 3 (moderate): Secondary | ICD-10-CM | POA: Insufficient documentation

## 2018-07-12 DIAGNOSIS — D631 Anemia in chronic kidney disease: Secondary | ICD-10-CM | POA: Diagnosis not present

## 2018-07-12 DIAGNOSIS — I252 Old myocardial infarction: Secondary | ICD-10-CM | POA: Insufficient documentation

## 2018-07-12 DIAGNOSIS — Z7951 Long term (current) use of inhaled steroids: Secondary | ICD-10-CM | POA: Diagnosis not present

## 2018-07-12 DIAGNOSIS — J45909 Unspecified asthma, uncomplicated: Secondary | ICD-10-CM | POA: Insufficient documentation

## 2018-07-12 DIAGNOSIS — I200 Unstable angina: Secondary | ICD-10-CM | POA: Diagnosis present

## 2018-07-12 DIAGNOSIS — I2510 Atherosclerotic heart disease of native coronary artery without angina pectoris: Principal | ICD-10-CM | POA: Insufficient documentation

## 2018-07-12 LAB — CBC WITH DIFFERENTIAL/PLATELET
Abs Immature Granulocytes: 0.07 10*3/uL (ref 0.00–0.07)
Basophils Absolute: 0 10*3/uL (ref 0.0–0.1)
Basophils Relative: 1 %
Eosinophils Absolute: 0.1 10*3/uL (ref 0.0–0.5)
Eosinophils Relative: 2 %
HCT: 35.9 % — ABNORMAL LOW (ref 36.0–46.0)
Hemoglobin: 10.7 g/dL — ABNORMAL LOW (ref 12.0–15.0)
Immature Granulocytes: 1 %
Lymphocytes Relative: 27 %
Lymphs Abs: 1.7 10*3/uL (ref 0.7–4.0)
MCH: 27.4 pg (ref 26.0–34.0)
MCHC: 29.8 g/dL — ABNORMAL LOW (ref 30.0–36.0)
MCV: 91.8 fL (ref 80.0–100.0)
Monocytes Absolute: 0.5 10*3/uL (ref 0.1–1.0)
Monocytes Relative: 7 %
Neutro Abs: 3.9 10*3/uL (ref 1.7–7.7)
Neutrophils Relative %: 62 %
Platelets: 296 10*3/uL (ref 150–400)
RBC: 3.91 MIL/uL (ref 3.87–5.11)
RDW: 22.4 % — ABNORMAL HIGH (ref 11.5–15.5)
WBC: 6.3 10*3/uL (ref 4.0–10.5)
nRBC: 0.3 % — ABNORMAL HIGH (ref 0.0–0.2)

## 2018-07-12 LAB — COMPREHENSIVE METABOLIC PANEL
ALT: 21 U/L (ref 0–44)
AST: 21 U/L (ref 15–41)
Albumin: 3.6 g/dL (ref 3.5–5.0)
Alkaline Phosphatase: 102 U/L (ref 38–126)
Anion gap: 12 (ref 5–15)
BUN: 25 mg/dL — ABNORMAL HIGH (ref 8–23)
CO2: 22 mmol/L (ref 22–32)
Calcium: 9.4 mg/dL (ref 8.9–10.3)
Chloride: 104 mmol/L (ref 98–111)
Creatinine, Ser: 2.38 mg/dL — ABNORMAL HIGH (ref 0.44–1.00)
GFR calc Af Amer: 23 mL/min — ABNORMAL LOW (ref >60)
GFR calc non Af Amer: 20 mL/min — ABNORMAL LOW (ref >60)
Glucose, Bld: 217 mg/dL — ABNORMAL HIGH (ref 70–99)
Potassium: 4.2 mmol/L (ref 3.5–5.1)
Sodium: 138 mmol/L (ref 135–145)
Total Bilirubin: 0.5 mg/dL (ref 0.3–1.2)
Total Protein: 8 g/dL (ref 6.5–8.1)

## 2018-07-12 LAB — CREATININE, SERUM
Creatinine, Ser: 2.32 mg/dL — ABNORMAL HIGH (ref 0.44–1.00)
GFR calc Af Amer: 24 mL/min — ABNORMAL LOW (ref >60)
GFR calc non Af Amer: 21 mL/min — ABNORMAL LOW (ref >60)

## 2018-07-12 LAB — CBC
HCT: 38.8 % (ref 36.0–46.0)
Hemoglobin: 11.7 g/dL — ABNORMAL LOW (ref 12.0–15.0)
MCH: 27.6 pg (ref 26.0–34.0)
MCHC: 30.2 g/dL (ref 30.0–36.0)
MCV: 91.5 fL (ref 80.0–100.0)
Platelets: 319 10*3/uL (ref 150–400)
RBC: 4.24 MIL/uL (ref 3.87–5.11)
RDW: 22.5 % — ABNORMAL HIGH (ref 11.5–15.5)
WBC: 7.6 10*3/uL (ref 4.0–10.5)
nRBC: 0 % (ref 0.0–0.2)

## 2018-07-12 LAB — SARS CORONAVIRUS 2 BY RT PCR (HOSPITAL ORDER, PERFORMED IN ~~LOC~~ HOSPITAL LAB): SARS Coronavirus 2: NEGATIVE

## 2018-07-12 LAB — GLUCOSE, CAPILLARY
Glucose-Capillary: 138 mg/dL — ABNORMAL HIGH (ref 70–99)
Glucose-Capillary: 115 mg/dL — ABNORMAL HIGH (ref 70–99)

## 2018-07-12 LAB — TROPONIN I
Troponin I: <0.03 ng/mL (ref <0.03)
Troponin I: <0.03 ng/mL (ref <0.03)
Troponin I: <0.03 ng/mL (ref <0.03)

## 2018-07-12 LAB — BRAIN NATRIURETIC PEPTIDE: B Natriuretic Peptide: 25.3 pg/mL (ref 0.0–100.0)

## 2018-07-12 MED ORDER — ACETAMINOPHEN 325 MG PO TABS
650.0000 mg | ORAL_TABLET | ORAL | Status: DC | PRN
  Administered 2018-07-14: 650 mg via ORAL
  Filled 2018-07-12: qty 2

## 2018-07-12 MED ORDER — INSULIN ASPART 100 UNIT/ML ~~LOC~~ SOLN
0.0000 [IU] | Freq: Three times a day (TID) | SUBCUTANEOUS | Status: DC
  Administered 2018-07-12: 1 [IU] via SUBCUTANEOUS
  Administered 2018-07-13 (×2): 2 [IU] via SUBCUTANEOUS

## 2018-07-12 MED ORDER — HEPARIN SODIUM (PORCINE) 5000 UNIT/ML IJ SOLN
5000.0000 [IU] | Freq: Three times a day (TID) | INTRAMUSCULAR | Status: DC
  Administered 2018-07-12 – 2018-07-14 (×5): 5000 [IU] via SUBCUTANEOUS
  Filled 2018-07-12 (×5): qty 1

## 2018-07-12 MED ORDER — ANASTROZOLE 1 MG PO TABS
1.0000 mg | ORAL_TABLET | Freq: Every day | ORAL | Status: DC
  Administered 2018-07-13 – 2018-07-14 (×2): 1 mg via ORAL
  Filled 2018-07-12 (×2): qty 1

## 2018-07-12 MED ORDER — NITROGLYCERIN 0.4 MG SL SUBL: 0.4000 mg | SUBLINGUAL_TABLET | SUBLINGUAL | Status: DC | PRN

## 2018-07-12 MED ORDER — ASPIRIN 81 MG PO CHEW
324.0000 mg | CHEWABLE_TABLET | Freq: Once | ORAL | Status: AC
  Administered 2018-07-12: 324 mg via ORAL
  Filled 2018-07-12: qty 4

## 2018-07-12 MED ORDER — ONDANSETRON HCL 4 MG/2ML IJ SOLN
4.0000 mg | Freq: Four times a day (QID) | INTRAMUSCULAR | Status: DC | PRN
  Administered 2018-07-13: 4 mg via INTRAVENOUS
  Filled 2018-07-12: qty 2

## 2018-07-12 MED ORDER — ALBUTEROL SULFATE (2.5 MG/3ML) 0.083% IN NEBU: 2.5000 mg | INHALATION_SOLUTION | Freq: Four times a day (QID) | RESPIRATORY_TRACT | Status: DC | PRN

## 2018-07-12 MED ORDER — SODIUM CHLORIDE 0.9 % IV SOLN
INTRAVENOUS | Status: DC
  Administered 2018-07-12 – 2018-07-14 (×3): via INTRAVENOUS

## 2018-07-12 MED ORDER — AMLODIPINE BESYLATE 5 MG PO TABS
5.0000 mg | ORAL_TABLET | Freq: Every day | ORAL | Status: DC
  Administered 2018-07-13 – 2018-07-14 (×2): 5 mg via ORAL
  Filled 2018-07-12 (×2): qty 1

## 2018-07-12 MED ORDER — METOPROLOL SUCCINATE ER 50 MG PO TB24
50.0000 mg | ORAL_TABLET | Freq: Every day | ORAL | Status: DC
  Administered 2018-07-12 – 2018-07-13 (×2): 50 mg via ORAL
  Filled 2018-07-12 (×2): qty 1

## 2018-07-12 MED ORDER — NITROGLYCERIN 0.4 MG SL SUBL
0.4000 mg | SUBLINGUAL_TABLET | Freq: Once | SUBLINGUAL | Status: AC
  Administered 2018-07-12: 0.4 mg via SUBLINGUAL
  Filled 2018-07-12: qty 1

## 2018-07-12 MED ORDER — ASPIRIN EC 81 MG PO TBEC
81.0000 mg | DELAYED_RELEASE_TABLET | Freq: Every day | ORAL | Status: DC
  Administered 2018-07-13 – 2018-07-14 (×2): 81 mg via ORAL
  Filled 2018-07-12 (×2): qty 1

## 2018-07-12 MED ORDER — MORPHINE SULFATE (PF) 4 MG/ML IV SOLN
4.0000 mg | Freq: Once | INTRAVENOUS | Status: AC
  Administered 2018-07-12: 4 mg via INTRAVENOUS
  Filled 2018-07-12: qty 1

## 2018-07-12 MED ORDER — ASPIRIN 300 MG RE SUPP: 300.0000 mg | RECTAL | Status: AC

## 2018-07-12 MED ORDER — NITROGLYCERIN 2 % TD OINT
0.5000 [in_us] | TOPICAL_OINTMENT | Freq: Four times a day (QID) | TRANSDERMAL | Status: DC
  Administered 2018-07-12 – 2018-07-14 (×7): 0.5 [in_us] via TOPICAL
  Filled 2018-07-12: qty 30

## 2018-07-12 MED ORDER — MOMETASONE FURO-FORMOTEROL FUM 200-5 MCG/ACT IN AERO
2.0000 | INHALATION_SPRAY | Freq: Two times a day (BID) | RESPIRATORY_TRACT | Status: DC
  Administered 2018-07-13 – 2018-07-14 (×2): 2 via RESPIRATORY_TRACT
  Filled 2018-07-12: qty 8.8

## 2018-07-12 MED ORDER — ASPIRIN 81 MG PO CHEW: 324.0000 mg | CHEWABLE_TABLET | ORAL | Status: AC

## 2018-07-12 MED ORDER — ALPRAZOLAM 0.25 MG PO TABS
0.2500 mg | ORAL_TABLET | Freq: Two times a day (BID) | ORAL | Status: DC
  Administered 2018-07-12 – 2018-07-14 (×4): 0.25 mg via ORAL
  Filled 2018-07-12 (×4): qty 1

## 2018-07-12 MED ORDER — PANTOPRAZOLE SODIUM 40 MG PO TBEC
40.0000 mg | DELAYED_RELEASE_TABLET | Freq: Every day | ORAL | Status: DC
  Administered 2018-07-13 – 2018-07-14 (×2): 40 mg via ORAL
  Filled 2018-07-12 (×2): qty 1

## 2018-07-12 MED ORDER — ENSURE ENLIVE PO LIQD
237.0000 mL | Freq: Two times a day (BID) | ORAL | Status: DC
  Administered 2018-07-12 – 2018-07-14 (×3): 237 mL via ORAL

## 2018-07-12 MED ORDER — ATORVASTATIN CALCIUM 10 MG PO TABS
20.0000 mg | ORAL_TABLET | Freq: Every day | ORAL | Status: DC
  Administered 2018-07-12 – 2018-07-14 (×3): 20 mg via ORAL
  Filled 2018-07-12 (×3): qty 2

## 2018-07-12 NOTE — ED Provider Notes (Signed)
[facility] EMERGENCY DEPARTMENT Provider Note   CSN: [id number] Arrival date & time: 07/12/18  1009    History   Chief Complaint Chief Complaint  Patient presents with  . Chest Pain    HPI [name] is a 68 y.o. female.     The history is provided by the patient.  Chest Pain  Pain location:  Substernal area Pain quality: aching and pressure   Pain radiates to:  Does not radiate Pain severity:  Mild Onset quality:  Gradual Associated symptoms: no abdominal pain, no back pain, no cough, no fever, no palpitations, no shortness of breath and no vomiting     Past Medical History:  Diagnosis Date  . Angina   . Arthritis    HANDS"  . Asthma   . Cancer ([place]) 09/04/2017   Right Breast  . Carpal tunnel syndrome, bilateral 11/26/2016  . Cholelithiasis   . Cocaine abuse ([place]) 07/2012   per E.R. drug screen  . Coronary artery disease   . Diabetes mellitus without mention of complication    type 2  . Dysphagia    esophageal dysmotility on 03/2011 esophagram   . Dyspnea    ongoing   . Fatty liver disease, nonalcoholic 3748   on Imaging.   Marland Kitchen GERD (gastroesophageal reflux disease)   . Headache   . History of colonic polyps 2009   adenomatous 2009, HP in 2009, 2011, 2013.   Marland Kitchen Hyperlipidemia   . Hypertension   . Iron deficiency anemia   . Myocardial infarction ([place])    years ago, 6 stents placed  . 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 Port-A-Cath in place 02/27/2018  . Cancer of overlapping sites of right breast ([place]) 11/27/2017  . Malignant neoplasm of upper-outer quadrant of right breast in female, estrogen receptor positive ([place]) 10/03/2017  . Carpal tunnel syndrome, bilateral 11/26/2016  . 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 Abdominal pain, epigastric   . Acute on chronic renal failure ([place]) 02/19/2015  . Diarrhea   . Nausea with vomiting   . Hyperglycemia  04/01/2014  . Hyperosmolar non-ketotic state in patient with type 2 diabetes mellitus ([place]) 04/01/2014  . Nausea vomiting and diarrhea 04/01/2014  . CAD (coronary artery disease) 04/01/2014  . Dehydration   . Diabetic ketoacidosis without coma associated with diabetes mellitus due to underlying condition ([place])   . High anion gap metabolic acidosis   . Cocaine abuse ([place])   . Toxic encephalopathy-Unlikely metabolic,?psychogenic 08/02/2012  . Bereavement 02/02/2012  . Depression 02/02/2012  . Iron deficiency anemia, unspecified 11/30/2011  . Nonspecific abnormal finding in stool contents 11/30/2011  . Benign neoplasm of colon 11/30/2011  . Cough 05/14/2011  . Chest pain 03/19/2011  . Anemia 03/19/2011  . Diabetes mellitus ([place]) 03/19/2011  . DOE (dyspnea on exertion) 03/19/2011  . Chest pain at rest 02/18/2011    Class: Acute  . Cholelithiasis 11/01/2010  . DM 04/08/2009  . HYPERLIPIDEMIA 04/08/2009  . Essential hypertension 04/08/2009  . MYOCARDIAL INFARCTION 04/08/2009  . Coronary atherosclerosis 04/08/2009  . GERD 04/08/2009  . FATTY LIVER DISEASE 04/08/2009  . COLONIC POLYPS, HX OF 04/08/2009    Past Surgical History:  Procedure Laterality Date  . ABDOMINAL ANGIOGRAM  02/20/2011   Procedure: ABDOMINAL ANGIOGRAM;  Surgeon: Clent Demark, MD;  Location: [facility] CATH LAB;  Service: Cardiovascular;;  . CARPAL TUNNEL RELEASE Right 07/04/2017   Procedure:  RIGHT CARPAL TUNNEL RELEASE;  Surgeon: Leanora Cover, MD;  Location: [facility];  Service: Orthopedics;  Laterality: Right;  . CARPAL TUNNEL RELEASE Left 09/12/2017   Procedure: LEFT CARPAL TUNNEL RELEASE;  Surgeon: Leanora Cover, MD;  Location: [facility];  Service: Orthopedics;  Laterality: Left;  . COLONOSCOPY  11/30/2011   Procedure: COLONOSCOPY;  Surgeon: Lafayette Dragon, MD;  Location: [facility] ENDOSCOPY;  Service: Endoscopy;  Laterality: N/A;  . CORONARY ANGIOPLASTY WITH STENT PLACEMENT    .  ESOPHAGOGASTRODUODENOSCOPY  11/30/2011   Procedure: ESOPHAGOGASTRODUODENOSCOPY (EGD);  Surgeon: Lafayette Dragon, MD;  Location: [facility]OS[facility];  Service: Endoscopy;  Laterality: N/A;  . ESOPHAGOGASTRODUODENOSCOPY N/A 02/23/2015   Procedure: ESOPHAGOGASTRODUODENOSCOPY (EGD);  Surgeon: Gatha Mayer, MD;  Location: [facility] ENDOSCOPY;  Service: Endoscopy;  Laterality: N/A;  . LEFT HEART CATHETERIZATION WITH CORONARY ANGIOGRAM N/A 02/20/2011   Procedure: LEFT HEART CATHETERIZATION WITH CORONARY ANGIOGRAM;  Surgeon: Clent Demark, MD;  Location: [facility] CATH LAB;  Service: Cardiovascular;  Laterality: N/A;  . MASTECTOMY Right 11/05/2017  . MASTECTOMY W/ SENTINEL NODE BIOPSY Right 11/27/2017  . MASTECTOMY W/ SENTINEL NODE BIOPSY Right 11/27/2017   Procedure: RIGHT TOTAL MASTECTOMY WITH SENTINEL LYMPH NODE BIOPSY;  Surgeon: Excell Seltzer, MD;  Location: [facility];  Service: General;  Laterality: Right;  . PORT A CATH REVISION Left 05/07/2018   Procedure: PORT A CATH REVISION;  Surgeon: Excell Seltzer, MD;  Location: [facility] ORS;  Service: General;  Laterality: Left;  . PORTACATH PLACEMENT Left 11/27/2017   Procedure: INSERTION PORT-A-CATH;  Surgeon: Excell Seltzer, MD;  Location: [facility];  Service: General;  Laterality: Left;  . RIGHT COLECTOMY  02/2007   for post polypectomy colonic perforation  . TUBAL LIGATION       OB History   No obstetric history on file.      Home Medications    Prior to Admission medications   Medication Sig Start Date End Date Taking? Authorizing Provider  albuterol (PROVENTIL) (2.5 MG/3ML) 0.083% nebulizer solution INHALE THE CONTENTS OF 1 VIAL USING NEBULIZER EVERY 6 HOURS AS NEEDEDFOR WHEEZING OR SHORTNESS OF BREATH Patient taking differently: Take 2.5 mg by nebulization every 6 (six) hours as needed for wheezing or shortness of breath.  07/02/18  Yes Nicholas Lose, MD  ALPRAZolam (XANAX) 0.25 MG tablet Take 0.25 mg by mouth 2 (two) times daily.    Yes [provider]   amLODipine (NORVASC) 5 MG tablet Take 5 mg by mouth daily.   Yes [provider]  anastrozole (ARIMIDEX) 1 MG tablet Take 1 tablet (1 mg total) by mouth daily. 02/27/18  Yes Nicholas Lose, MD  aspirin EC 81 MG EC tablet Take 1 tablet (81 mg total) by mouth daily. 02/06/12  Yes Patrecia Pour, NP  atorvastatin (LIPITOR) 20 MG tablet Take 20 mg by mouth daily.   Yes [provider]  budesonide-formoterol (SYMBICORT) 160-4.5 MCG/ACT inhaler Inhale 1 puff into the lungs 2 (two) times daily.    Yes [provider]  feeding supplement, ENSURE ENLIVE, (ENSURE ENLIVE) LIQD Take 237 mLs by mouth 2 (two) times daily between meals. 02/24/15  Yes Ghimire, Henreitta Leber, MD  HYDROcodone-acetaminophen (NORCO/VICODIN) 5-325 MG tablet Take 1 tablet by mouth every 4 (four) hours as needed. Patient taking differently: Take 1 tablet by mouth every 4 (four) hours as needed for moderate pain.  03/03/18  Yes Isla Pence, MD  loperamide (IMODIUM) 2 MG capsule Take 1 capsule (2 mg total) by mouth 4 (four) times  daily as needed for diarrhea or loose stools. 02/27/15  Yes Marella Chimes, PA-C  metFORMIN (GLUCOPHAGE) 1000 MG tablet Take 500 mg by mouth 2 (two) times daily with a meal.    Yes [provider]  metoprolol succinate (TOPROL-XL) 50 MG 24 hr tablet Take 50 mg by mouth at bedtime. Take with or immediately following a meal.   Yes [provider]  nitroGLYCERIN (NITROSTAT) 0.4 MG SL tablet Place 1 tablet (0.4 mg total) under the tongue every 5 (five) minutes as needed. For chest pain Patient taking differently: Place 0.4 mg under the tongue every 5 (five) minutes as needed for chest pain.  02/06/12  Yes Patrecia Pour, NP  Omega-3 Fatty Acids (FISH OIL) 1000 MG CAPS Take 1,000 mg by mouth daily.   Yes [provider]  ondansetron (ZOFRAN) 8 MG tablet Take 1 tablet (8 mg total) by mouth every 8 (eight) hours as needed for nausea or vomiting. 06/16/18  Yes Nicholas Lose, MD  pantoprazole (PROTONIX) 40 MG tablet Take 1 tablet (40 mg total) by mouth daily. 02/24/15  Yes Ghimire, Henreitta Leber, MD  lidocaine-prilocaine (EMLA) cream Apply to affected area once Patient not taking: Reported on 04/10/2018 01/14/18   Magrinat, Virgie Dad, MD    Family History Family History  Problem Relation Age of Onset  . Heart disease Mother   . Diabetes Mother   . Cancer Father        unsure what kind  . Diabetes Sister   . Colon cancer Neg Hx   . Breast cancer Neg Hx     Social History Social History   Tobacco Use  . Smoking status: Former Smoker    Packs/day: 0.50    Years: 47.00    Pack years: 23.50    Types: Cigarettes    Last attempt to quit: 05/09/2010    Years since quitting: 8.1  . Smokeless tobacco: Never Used  Substance Use Topics  . Alcohol use: No  . Drug use: Not Currently    Types: Cocaine    Comment: positive drug screen (cocaine, benzo's)07/2012 in emergency room     Allergies   Patient has no known allergies.   Review of Systems Review of Systems  Constitutional: Negative for chills and fever.  HENT: Negative for ear pain and sore throat.   Eyes: Negative for pain and visual disturbance.  Respiratory: Negative for cough and shortness of breath.   Cardiovascular: Positive for chest pain. Negative for palpitations.  Gastrointestinal: Negative for abdominal pain and vomiting.  Genitourinary: Negative for dysuria and hematuria.  Musculoskeletal: Negative for arthralgias and back pain.  Skin: Negative for color change and rash.  Neurological: Negative for seizures and syncope.  All other systems reviewed and are negative.    Physical Exam Updated Vital Signs  ED Triage Vitals  Enc Vitals Group     BP 07/12/18 1017 (!) 180/64     Pulse Rate 07/12/18 1017 77     Resp 07/12/18 1017 20     Temp 07/12/18 1017 99 F (37.2 C)     Temp Source 07/12/18 1017 Oral     SpO2 07/12/18 1017 99 %     Weight 07/12/18 1023 128 lb (58.1 kg)      Height 07/12/18 1023 4\' 11"  (1.499 m)     Head Circumference --      Peak Flow --      Pain Score 07/12/18 1019 5     Pain Loc --  Pain Edu? --      Excl. in [place]? --     Physical Exam Vitals signs and nursing note reviewed.  Constitutional:      General: She is not in acute distress.    Appearance: She is well-developed.  HENT:     Head: Normocephalic and atraumatic.  Eyes:     Extraocular Movements: Extraocular movements intact.     Conjunctiva/sclera: Conjunctivae normal.     Pupils: Pupils are equal, round, and reactive to light.  Neck:     Musculoskeletal: Normal range of motion and neck supple.  Cardiovascular:     Rate and Rhythm: Normal rate and regular rhythm.     Heart sounds: No murmur.  Pulmonary:     Effort: Pulmonary effort is normal. No respiratory distress.     Breath sounds: Normal breath sounds. No decreased breath sounds, wheezing, rhonchi or rales.  Abdominal:     Palpations: Abdomen is soft.     Tenderness: There is no abdominal tenderness.  Musculoskeletal:     Right lower leg: No edema.     Left lower leg: No edema.  Skin:    General: Skin is warm and dry.     Capillary Refill: Capillary refill takes less than 2 seconds.  Neurological:     General: No focal deficit present.     Mental Status: She is alert.  Psychiatric:        Mood and Affect: Mood normal.      ED Treatments / Results  Labs (all labs ordered are listed, but only abnormal results are displayed) Labs Reviewed  CBC WITH DIFFERENTIAL/PLATELET - Abnormal; Notable for the following components:      Result Value   Hemoglobin 10.7 (*)    HCT 35.9 (*)    MCHC 29.8 (*)    RDW 22.4 (*)    nRBC 0.3 (*)    All other components within normal limits  COMPREHENSIVE METABOLIC PANEL - Abnormal; Notable for the following components:   Glucose, Bld 217 (*)    BUN 25 (*)    Creatinine, Ser 2.38 (*)    GFR calc non Af Amer 20 (*)    GFR calc Af Amer 23 (*)    All other components  within normal limits  SARS CORONAVIRUS 2 (HOSPITAL ORDER, [facility])  TROPONIN I  BRAIN NATRIURETIC PEPTIDE    EKG EKG Interpretation  Date/Time:  Saturday July 12 2018 11:20:32 EDT Ventricular Rate:  77 PR Interval:    QRS Duration: 84 QT Interval:  377 QTC Calculation: 427 R Axis:   7 Text Interpretation:  Sinus rhythm Confirmed by Lennice Sites [phone]) on 07/12/2018 11:41:33 AM   Radiology Dg Chest Portable 1 View  Result Date: 07/12/2018 CLINICAL DATA:  Chest pain EXAM: PORTABLE CHEST 1 VIEW COMPARISON:  May 07, 2018 FINDINGS: Port-A-Cath tip is at the junction of the left innominate vein and superior vena cava. No pneumothorax. There is a persistent small left pleural effusion with mild left base atelectasis. Lungs elsewhere are clear. Heart is mildly enlarged with pulmonary vascularity normal. There is aortic atherosclerosis. No adenopathy. There is degenerative change in each shoulder. Surgical clips are noted in the right axillary region. IMPRESSION: Small left pleural effusion with mild left base atelectasis. No consolidation. Stable cardiomegaly. Port-A-Cath tip at junction of superior vena cava and left innominate vein. Aortic Atherosclerosis (ICD10-I70.0). Electronically Signed   By: Lowella Grip III M.D.   On: 07/12/2018 11:33  Procedures Procedures (including critical care time)  Medications Ordered in ED Medications  morphine 4 MG/ML injection 4 mg (4 mg Intravenous Given 07/12/18 1143)  nitroGLYCERIN (NITROSTAT) SL tablet 0.4 mg (0.4 mg Sublingual Given 07/12/18 1211)  aspirin chewable tablet 324 mg (324 mg Oral Given 07/12/18 1210)     Initial Impression / Assessment and Plan / ED Course  I have reviewed the triage vital signs and the nursing notes.  Pertinent labs & imaging results that were available during my care of the patient were reviewed by me and considered in my medical decision making (see chart for details).      [name] is a 68 year old female history of CAD, breast cancer who presents to the ED with chest pain.  Patient with normal vitals.  No fever.  Chest pain is substernal but on and off since last night.  Has had 6 cardiac stents in the past.  Denies any shortness of breath.  No hypoxia on exam.  No cough, no infectious symptoms.  Pain had gotten worse this morning.  Will give IV morphine.  Patient had last stress test in 2018 that was unremarkable.  EKG upon arrival shows sinus rhythm.  No signs of ischemic changes.  Patient did follow-up yesterday with heart failure clinic and was scheduled for an outpatient echocardiogram.  However, her primary cardiologist is Dr. Terrence Dupont.  She was not having chest pain at the time of that visit.  Patient with first troponin within normal limits.  BNP within normal limits.  Chest x-ray showed no signs of pneumonia, pneumothorax.  Patient has stable pleural effusion.  Patient with creatinine at baseline.  Otherwise no significant leukocytosis, anemia, electrolyte abnormality.  Patient felt some improvement with IV morphine.  Will contact Dr. Terrence Dupont given her cardiac history to see what further cardiac work-up is needed at this time.  Less concern for PE at this time given no hypoxia, no shortness of breath.  Patient had improvement following morphine, nitro, aspirin.  Discussed the case with Dr. Terrence Dupont who will admit for further ACS work-up.  Patient remained hemodynamically stable throughout my care.  Suspect after discussion with Dr. Terrence Dupont that this could also be musculoskeletal which she has had a history of some in the past.  However, pain does not appear clearly reproducible on exam and given her cardiac history he will admit for further work-up.  This chart was dictated using voice recognition software.  Despite best efforts to proofread,  errors can occur which can change the documentation meaning.   Final Clinical Impressions(s) / ED Diagnoses    Final diagnoses:  Chest pain, unspecified type    ED Discharge Orders    None       Lennice Sites, DO 07/12/18 1239

## 2018-07-12 NOTE — Plan of Care (Signed)
  Problem: Clinical Measurements: Goal: Will remain free from infection Outcome: Progressing   Problem: Activity: Goal: Risk for activity intolerance will decrease Outcome: Progressing   Problem: Nutrition: Goal: Adequate nutrition will be maintained Outcome: Progressing   Problem: Coping: Goal: Level of anxiety will decrease Outcome: Progressing   Problem: Elimination: Goal: Will not experience complications related to bowel motility Outcome: Progressing   Problem: Pain Managment: Goal: General experience of comfort will improve Outcome: Progressing   Problem: Safety: Goal: Ability to remain free from injury will improve Outcome: Progressing   Problem: Skin Integrity: Goal: Risk for impaired skin integrity will decrease Outcome: Progressing

## 2018-07-12 NOTE — ED Triage Notes (Signed)
PT talking with Grandson on her cell Phone.

## 2018-07-12 NOTE — ED Notes (Signed)
ED TO INPATIENT HANDOFF REPORT  ED Nurse Name and Phone #  S Name/Age/Gender [name] 68 y.o. female Room/Bed: 013C/013C  Code Status   Code Status: Prior  Home/SNF/Other Home Patient oriented to: self, place, time and situation Is this baseline? Yes   Triage Complete: Triage complete  Chief Complaint chest pain/sob  Triage Note Patient presents to ed c/o chest pain and sob onset yest. States she is seen at the cancer center for breast ca her next chemo is 6/18. States she justed her breathing treatment this am without relief. States pain is worse with palpation.   PT talking with Grandson on her cell Phone.   Allergies No Known Allergies  Level of Care/Admitting Diagnosis ED Disposition    ED Disposition Condition Comment   Admit  Hospital Area: [facility] [[id number]]  Level of Care: Telemetry Cardiac [103]  Covid Evaluation: N/A  Diagnosis: Unstable angina [facility]) [[id number]]  Admitting Physician: Charolette Forward [[id number]]  Attending Physician: Charolette Forward [[id number]]  PT Class (Do Not Modify): Observation [104]  PT Acc Code (Do Not Modify): Observation [10022]       B Medical/Surgery History Past Medical History:  Diagnosis Date  . Angina   . Arthritis    HANDS"  . Asthma   . Cancer ([place]) 09/04/2017   Right Breast  . Carpal tunnel syndrome, bilateral 11/26/2016  . Cholelithiasis   . Cocaine abuse ([place]) 07/2012   per E.R. drug screen  . Coronary artery disease   . Diabetes mellitus without mention of complication    type 2  . Dysphagia    esophageal dysmotility on 03/2011 esophagram   . Dyspnea    ongoing   . Fatty liver disease, nonalcoholic 9629   on Imaging.   Marland Kitchen GERD (gastroesophageal reflux disease)   . Headache   . History of colonic polyps 2009   adenomatous 2009, HP in 2009, 2011, 2013.   Marland Kitchen Hyperlipidemia   . Hypertension   . Iron deficiency anemia   . Myocardial infarction ([place])    years ago, 6 stents placed  .  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 ABDOMINAL ANGIOGRAM  02/20/2011   Procedure: ABDOMINAL ANGIOGRAM;  Surgeon: Clent Demark, MD;  Location: [facility] CATH LAB;  Service: Cardiovascular;;  . CARPAL TUNNEL RELEASE Right 07/04/2017   Procedure: RIGHT CARPAL TUNNEL RELEASE;  Surgeon: Leanora Cover, MD;  Location: [place];  Service: Orthopedics;  Laterality: Right;  . CARPAL TUNNEL RELEASE Left 09/12/2017   Procedure: LEFT CARPAL TUNNEL RELEASE;  Surgeon: Leanora Cover, MD;  Location: [place];  Service: Orthopedics;  Laterality: Left;  . COLONOSCOPY  11/30/2011   Procedure: COLONOSCOPY;  Surgeon: Lafayette Dragon, MD;  Location: [facility] ENDOSCOPY;  Service: Endoscopy;  Laterality: N/A;  . CORONARY ANGIOPLASTY WITH STENT PLACEMENT    . ESOPHAGOGASTRODUODENOSCOPY  11/30/2011   Procedure: ESOPHAGOGASTRODUODENOSCOPY (EGD);  Surgeon: Lafayette Dragon, MD;  Location: [facility]OS[facility];  Service: Endoscopy;  Laterality: N/A;  . ESOPHAGOGASTRODUODENOSCOPY N/A 02/23/2015   Procedure: ESOPHAGOGASTRODUODENOSCOPY (EGD);  Surgeon: Gatha Mayer, MD;  Location: [facility] ENDOSCOPY;  Service: Endoscopy;  Laterality: N/A;  . LEFT HEART CATHETERIZATION WITH CORONARY ANGIOGRAM N/A 02/20/2011   Procedure: LEFT HEART CATHETERIZATION WITH CORONARY ANGIOGRAM;  Surgeon: Clent Demark, MD;  Location: [facility] CATH LAB;  Service: Cardiovascular;  Laterality: N/A;  . MASTECTOMY Right 11/05/2017  . MASTECTOMY W/ SENTINEL NODE BIOPSY Right 11/27/2017  .  MASTECTOMY W/ SENTINEL NODE BIOPSY Right 11/27/2017   Procedure: RIGHT TOTAL MASTECTOMY WITH SENTINEL LYMPH NODE BIOPSY;  Surgeon: Excell Seltzer, MD;  Location: [place];  Service: General;  Laterality: Right;  . PORT A CATH REVISION Left 05/07/2018   Procedure: PORT A CATH REVISION;  Surgeon: Excell Seltzer, MD;  Location: [facility] ORS;  Service: General;  Laterality: Left;  . PORTACATH PLACEMENT Left 11/27/2017    Procedure: INSERTION PORT-A-CATH;  Surgeon: Excell Seltzer, MD;  Location: [place];  Service: General;  Laterality: Left;  . RIGHT COLECTOMY  02/2007   for post polypectomy colonic perforation  . TUBAL LIGATION       A IV Location/Drains/Wounds Patient Lines/Drains/Airways Status   Active Line/Drains/Airways    Name:   Placement date:   Placement time:   Site:   Days:   Implanted Port 11/27/17 Left Chest   11/27/17    1426    Chest   227   Implanted Port 05/07/18 Left Chest   05/07/18    1200    Chest   66   Peripheral IV 07/12/18 Left Forearm   07/12/18    1032    Forearm   less than 1   Incision (Closed) 07/04/17 Arm Right   07/04/17    1011     373   Incision (Closed) 09/12/17 Wrist Left   09/12/17    0912     303   Incision (Closed) 11/27/17 Breast Right   11/27/17    1411     227   Incision (Closed) 11/27/17 Chest Left   11/27/17    1428     227   Incision (Closed) 05/07/18 Other (Comment)   05/07/18    1000     66   Wound / Incision (Open or Dehisced) 01/19/18 Other (Comment);Incision - Dehisced Breast Right;Lateral  packing to the right bleeding controlled   01/19/18    0902    Breast   174          Intake/Output Last 24 hours No intake or output data in the 24 hours ending 07/12/18 1418  Labs/Imaging Results for orders placed or performed during the hospital encounter of 07/12/18 (from the past 48 hour(s))  CBC with Differential     Status: Abnormal   Collection Time: 07/12/18 10:30 AM  Result Value Ref Range   WBC 6.3 4.0 - 10.5 K/uL   RBC 3.91 3.87 - 5.11 MIL/uL   Hemoglobin 10.7 (L) 12.0 - 15.0 g/dL   HCT 35.9 (L) 36.0 - 46.0 %   MCV 91.8 80.0 - 100.0 fL   MCH 27.4 26.0 - 34.0 pg   MCHC 29.8 (L) 30.0 - 36.0 g/dL   RDW 22.4 (H) 11.5 - 15.5 %   Platelets 296 150 - 400 K/uL   nRBC 0.3 (H) 0.0 - 0.2 %   Neutrophils Relative % 62 %   Neutro Abs 3.9 1.7 - 7.7 K/uL   Lymphocytes Relative 27 %   Lymphs Abs 1.7 0.7 - 4.0 K/uL   Monocytes Relative 7 %   Monocytes  Absolute 0.5 0.1 - 1.0 K/uL   Eosinophils Relative 2 %   Eosinophils Absolute 0.1 0.0 - 0.5 K/uL   Basophils Relative 1 %   Basophils Absolute 0.0 0.0 - 0.1 K/uL   Immature Granulocytes 1 %   Abs Immature Granulocytes 0.07 0.00 - 0.07 K/uL    Comment: Performed at [facility], [address], [place], [place] [address]  Comprehensive  metabolic panel     Status: Abnormal   Collection Time: 07/12/18 10:30 AM  Result Value Ref Range   Sodium 138 135 - 145 mmol/L   Potassium 4.2 3.5 - 5.1 mmol/L   Chloride 104 98 - 111 mmol/L   CO2 22 22 - 32 mmol/L   Glucose, Bld 217 (H) 70 - 99 mg/dL   BUN 25 (H) 8 - 23 mg/dL   Creatinine, Ser 2.38 (H) 0.44 - 1.00 mg/dL   Calcium 9.4 8.9 - 10.3 mg/dL   Total Protein 8.0 6.5 - 8.1 g/dL   Albumin 3.6 3.5 - 5.0 g/dL   AST 21 15 - 41 U/L   ALT 21 0 - 44 U/L   Alkaline Phosphatase 102 38 - 126 U/L   Total Bilirubin 0.5 0.3 - 1.2 mg/dL   GFR calc non Af Amer 20 (L) >60 mL/min   GFR calc Af Amer 23 (L) >60 mL/min   Anion gap 12 5 - 15    Comment: Performed at [facility], [address], [place], [place] [address]  Troponin I - ONCE - STAT     Status: None   Collection Time: 07/12/18 10:30 AM  Result Value Ref Range   Troponin I <0.03 <0.03 ng/mL    Comment: Performed at [facility] [address], [place], [place] [address]  Brain natriuretic peptide     Status: None   Collection Time: 07/12/18 10:30 AM  Result Value Ref Range   B Natriuretic Peptide 25.3 0.0 - 100.0 pg/mL    Comment: Performed at [facility] [address] [place], [place] [address]   Dg Chest Portable 1 View  Result Date: 07/12/2018 CLINICAL DATA:  Chest pain EXAM: PORTABLE CHEST 1 VIEW COMPARISON:  May 07, 2018 FINDINGS: Port-A-Cath tip is at the junction of the left innominate vein and superior vena cava. No pneumothorax. There is a persistent small left pleural effusion with mild left base atelectasis. Lungs elsewhere are clear. Heart is mildly  enlarged with pulmonary vascularity normal. There is aortic atherosclerosis. No adenopathy. There is degenerative change in each shoulder. Surgical clips are noted in the right axillary region. IMPRESSION: Small left pleural effusion with mild left base atelectasis. No consolidation. Stable cardiomegaly. Port-A-Cath tip at junction of superior vena cava and left innominate vein. Aortic Atherosclerosis (ICD10-I70.0). Electronically Signed   By: Lowella Grip III M.D.   On: 07/12/2018 11:33    Pending Labs Unresulted Labs (From admission, onward)    Start     Ordered   07/12/18 1238  SARS Coronavirus 2 (CEPHEID - Performed in [facility] [place] hospital lab), Hosp Order  (Asymptomatic Patients Labs)  Once,   R    Question:  Rule Out  Answer:  Yes   07/12/18 1237   Signed and Held  HIV antibody (Routine Testing)  Once,   R     Signed and Held   Signed and Held  CBC  (heparin)  Once,   R    Comments:  Baseline for heparin therapy IF NOT ALREADY DRAWN.  Notify MD if PLT < 100 K.    Signed and Held   Signed and Held  Creatinine, serum  (heparin)  Once,   R    Comments:  Baseline for heparin therapy IF NOT ALREADY DRAWN.    Signed and Held   Signed and Held  Troponin I - Now Then Q6H  Now then every 6 hours,   STAT  Signed and Held   Signed and Held  Hemoglobin A1c  Tomorrow morning,   R     Signed and Held   Signed and Held  Basic metabolic panel  Tomorrow morning,   R     Signed and Held   Signed and Held  Lipid panel  Tomorrow morning,   R     Signed and Held   Signed and Held  CBC  Tomorrow morning,   R     Signed and Held          Vitals/Pain Today's Vitals   07/12/18 1345 07/12/18 1345 07/12/18 1400 07/12/18 1415  BP: (!) 175/100 (!) 178/78 (!) 177/75 (!) 172/62  Pulse: 80 83 79 75  Resp: 14  19 (!) 22  Temp:      TempSrc:      SpO2: 96% 95% 95% 94%  Weight:      Height:      PainSc:        Isolation Precautions No active isolations  Medications Medications   morphine 4 MG/ML injection 4 mg (4 mg Intravenous Given 07/12/18 1143)  nitroGLYCERIN (NITROSTAT) SL tablet 0.4 mg (0.4 mg Sublingual Given 07/12/18 1211)  aspirin chewable tablet 324 mg (324 mg Oral Given 07/12/18 1210)    Mobility walks High fall risk   Focused Assessments Cardiac Assessment Handoff:    Lab Results  Component Value Date   CKTOTAL 47 05/07/2011   CKMB 1.3 05/07/2011   TROPONINI <0.03 07/12/2018   Lab Results  Component Value Date   DDIMER 1.63 (H) 03/03/2018   Does the Patient currently have chest pain? Yes     R Recommendations: See Admitting Provider Note  Report given to:   Additional Notes:

## 2018-07-12 NOTE — ED Triage Notes (Signed)
Patient presents to ed c/o chest pain and sob onset yest. States she is seen at the cancer center for breast ca her next chemo is 6/18. States she justed her breathing treatment this am without relief. States pain is worse with palpation.

## 2018-07-12 NOTE — ED Notes (Signed)
Velora Mediate (grandson) [phone]

## 2018-07-12 NOTE — H&P (Signed)
[name] is an 68 y.o. female.   Chief Complaint: Recurrent chest pain HPI: Patient is 68 year old female with past medical history significant for multiple medical problems i.e. 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  Denies any shortness of breath.  Denies palpitation lightheadedness or syncope patient is very vague in her symptoms states took 3 sublingual nitro yesterday with relief states pain last from minutes to hours.  Denies any recent stress test.  Patient had recently 2D echo done for evaluation for chemotherapy which showed near normal LV systolic function.  Past Medical History:  Diagnosis Date  . Angina   . Arthritis    HANDS"  . Asthma   . Cancer ([place]) 09/04/2017   Right Breast  . Carpal tunnel syndrome, bilateral 11/26/2016  . Cholelithiasis   . Cocaine abuse ([place]) 07/2012   per E.R. drug screen  . Coronary artery disease   . Diabetes mellitus without mention of complication    type 2  . Dysphagia    esophageal dysmotility on 03/2011 esophagram   . Dyspnea    ongoing   . Fatty liver disease, nonalcoholic 6503   on Imaging.   Marland Kitchen GERD (gastroesophageal reflux disease)   . Headache   . History of colonic polyps 2009   adenomatous 2009, HP in 2009, 2011, 2013.   Marland Kitchen Hyperlipidemia   . Hypertension   . Iron deficiency anemia   . Myocardial infarction ([place])    years ago, 6 stents placed  . 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  ABDOMINAL ANGIOGRAM  02/20/2011   Procedure: ABDOMINAL ANGIOGRAM;  Surgeon: Clent Demark, MD;  Location: [facility] CATH LAB;  Service: Cardiovascular;;  . CARPAL TUNNEL RELEASE Right 07/04/2017   Procedure: RIGHT CARPAL TUNNEL RELEASE;  Surgeon: Leanora Cover, MD;  Location: [place];  Service: Orthopedics;  Laterality: Right;  . CARPAL TUNNEL RELEASE Left 09/12/2017   Procedure: LEFT CARPAL TUNNEL RELEASE;  Surgeon: Leanora Cover, MD;  Location: [place];  Service: Orthopedics;  Laterality: Left;  . COLONOSCOPY  11/30/2011   Procedure: COLONOSCOPY;  Surgeon: Lafayette Dragon, MD;  [facility] ENDOSCOPY;  Service: Endoscopy;  Laterality: N/A;  . CORONARY ANGIOPLASTY WITH STENT PLACEMENT    . ESOPHAGOGASTRODUODENOSCOPY  11/30/2011   Procedure: ESOPHAGOGASTRODUODENOSCOPY (EGD);  Surgeon: Lafayette Dragon, MD;  Location: [facility];  Service: Endoscopy;  Laterality: N/A;  . ESOPHAGOGASTRODUODENOSCOPY N/A 02/23/2015   Procedure: ESOPHAGOGASTRODUODENOSCOPY (EGD);  Surgeon: Gatha Mayer, MD;  Location: [facility] ENDOSCOPY;  Service: Endoscopy;  Laterality: N/A;  . LEFT HEART CATHETERIZATION WITH CORONARY ANGIOGRAM N/A 02/20/2011   Procedure: LEFT HEART CATHETERIZATION WITH CORONARY ANGIOGRAM;  Surgeon: Clent Demark, MD;  Location: [place] CATH LAB;  Service: Cardiovascular;  Laterality: N/A;  . MASTECTOMY Right 11/05/2017  . MASTECTOMY W/ SENTINEL NODE BIOPSY Right 11/27/2017  . MASTECTOMY W/ SENTINEL NODE BIOPSY Right 11/27/2017   Procedure: RIGHT TOTAL MASTECTOMY WITH SENTINEL LYMPH NODE BIOPSY;  Surgeon: Excell Seltzer, MD;  Location: [facility];  Service: General;  Laterality: Right;  .  PORT A CATH REVISION Left 05/07/2018   Procedure: PORT A CATH REVISION;  Surgeon: Excell Seltzer, MD;  [facility];  Service: General;  Laterality: Left;  . PORTACATH PLACEMENT Left 11/27/2017   Procedure: INSERTION PORT-A-CATH;  Surgeon: Excell Seltzer, MD;  Location: [facility];  Service:  General;  Laterality: Left;  . RIGHT COLECTOMY  02/2007   for post polypectomy colonic perforation  . TUBAL LIGATION      Family History  Problem Relation Age of Onset  . Heart disease Mother   . Diabetes Mother   . Cancer Father        unsure what kind  . Diabetes Sister   . Colon cancer Neg Hx   . Breast cancer Neg Hx    Social History:  reports that she quit smoking about 8 years ago. Her smoking use included cigarettes. She has a 23.50 pack-year smoking history. She has never used smokeless tobacco. She reports previous drug use. Drug: Cocaine. She reports that she does not drink alcohol.  Allergies: No Known Allergies  (Not in a hospital admission)   Results for orders placed or performed during the hospital encounter of 07/12/18 (from the past 48 hour(s))  CBC with Differential     Status: Abnormal   Collection Time: 07/12/18 10:30 AM  Result Value Ref Range   WBC 6.3 4.0 - 10.5 K/uL   RBC 3.91 3.87 - 5.11 MIL/uL   Hemoglobin 10.7 (L) 12.0 - 15.0 g/dL   HCT 35.9 (L) 36.0 - 46.0 %   MCV 91.8 80.0 - 100.0 fL   MCH 27.4 26.0 - 34.0 pg   MCHC 29.8 (L) 30.0 - 36.0 g/dL   RDW 22.4 (H) 11.5 - 15.5 %   Platelets 296 150 - 400 K/uL   nRBC 0.3 (H) 0.0 - 0.2 %   Neutrophils Relative % 62 %   Neutro Abs 3.9 1.7 - 7.7 K/uL   Lymphocytes Relative 27 %   Lymphs Abs 1.7 0.7 - 4.0 K/uL   Monocytes Relative 7 %   Monocytes Absolute 0.5 0.1 - 1.0 K/uL   Eosinophils Relative 2 %   Eosinophils Absolute 0.1 0.0 - 0.5 K/uL   Basophils Relative 1 %   Basophils Absolute 0.0 0.0 - 0.1 K/uL   Immature Granulocytes 1 %   Abs Immature Granulocytes 0.07 0.00 - 0.07 K/uL    Comment: Performed at [facility], [address], [place], [place] [address]  Comprehensive metabolic panel     Status: Abnormal   Collection Time: 07/12/18 10:30 AM  Result Value Ref Range   Sodium 138 135 - 145 mmol/L   Potassium 4.2 3.5 - 5.1 mmol/L   Chloride 104 98 - 111 mmol/L   CO2 22 22 - 32 mmol/L    Glucose, Bld 217 (H) 70 - 99 mg/dL   BUN 25 (H) 8 - 23 mg/dL   Creatinine, Ser 2.38 (H) 0.44 - 1.00 mg/dL   Calcium 9.4 8.9 - 10.3 mg/dL   Total Protein 8.0 6.5 - 8.1 g/dL   Albumin 3.6 3.5 - 5.0 g/dL   AST 21 15 - 41 U/L   ALT 21 0 - 44 U/L   Alkaline Phosphatase 102 38 - 126 U/L   Total Bilirubin 0.5 0.3 - 1.2 mg/dL   GFR calc non Af Amer 20 (L) >60 mL/min   GFR calc Af Amer 23 (L) >60 mL/min   Anion gap 12 5 - 15    Comment: Performed at  [facility], [place] [address] [place], [place] [address]  Troponin I - ONCE - STAT     Status: None   Collection Time: 07/12/18 10:30 AM  Result Value Ref Range   Troponin I <0.03 <0.03 ng/mL    Comment: Performed at [facility] [address], [place], [place] [address]  Brain natriuretic peptide     Status: None   Collection Time: 07/12/18 10:30 AM  Result Value Ref Range   B Natriuretic Peptide 25.3 0.0 - 100.0 pg/mL    Comment: Performed at [facility] [address] [place], [place] [address]   Dg Chest Portable 1 View  Result Date: 07/12/2018 CLINICAL DATA:  Chest pain EXAM: PORTABLE CHEST 1 VIEW COMPARISON:  May 07, 2018 FINDINGS: Port-A-Cath tip is at the junction of the left innominate vein and superior vena cava. No pneumothorax. There is a persistent small left pleural effusion with mild left base atelectasis. Lungs elsewhere are clear. Heart is mildly enlarged with pulmonary vascularity normal. There is aortic atherosclerosis. No adenopathy. There is degenerative change in each shoulder. Surgical clips are noted in the right axillary region. IMPRESSION: Small left pleural effusion with mild left base atelectasis. No consolidation. Stable cardiomegaly. Port-A-Cath tip at junction of superior vena cava and left innominate vein. Aortic Atherosclerosis (ICD10-I70.0). Electronically Signed   By: Lowella Grip III M.D.   On: 07/12/2018 11:33    Review of Systems  Constitutional: Negative for chills and fever.  HENT:  Negative for hearing loss.   Eyes: Negative for blurred vision.  Respiratory: Negative for cough, hemoptysis, sputum production and shortness of breath.   Cardiovascular: Negative for palpitations, orthopnea, leg swelling and PND.  Gastrointestinal: Negative for nausea and vomiting.  Genitourinary: Negative for dysuria.  Skin: Negative for rash.  Neurological: Negative for dizziness.    Blood pressure (!) 173/76, pulse 74, temperature 99 F (37.2 C), temperature source Oral, resp. rate 17, height 4\' 11"  (1.499 m), weight 58.1 kg, SpO2 94 %. Physical Exam  Constitutional: She is oriented to person, place, and time.  Eyes: Pupils are equal, round, and reactive to light. Conjunctivae are normal. Left eye exhibits no discharge. No scleral icterus.  Neck: Normal range of motion. Neck supple. No JVD present. No tracheal deviation present. No thyromegaly present.  Cardiovascular: Normal rate and regular rhythm.  Murmur (Soft systolic and diastolic murmur noted) heard. Respiratory:  Decreased breath sound at bases poor effort  GI: Soft. Bowel sounds are normal. She exhibits distension. There is no abdominal tenderness. There is no rebound.  Musculoskeletal:        General: No tenderness, deformity or edema.  Neurological: She is alert and oriented to person, place, and time.     Assessment/Plan Recurrent chest pain worrisome for angina rule out MI rule out progression of CAD Coronary artery disease history of PCI to RCA and left circumflex in the past Hypertension Insulin requiring diabetes mellitus Hyperlipidemia Malignant carcinoma of the right breast status post mastectomy GERD History of bronchial asthma History of colonic polyp Chronic kidney disease stage III Anemia of chronic disease Plan As per orders DC metformin in view of worsening renal function We will schedule for Lexiscan Myoview in a.m. Continue rest of home medications  Charolette Forward, MD 07/12/2018, 1:29 PM

## 2018-07-13 ENCOUNTER — Observation Stay (HOSPITAL_COMMUNITY): Payer: Medicare Other

## 2018-07-13 DIAGNOSIS — I2510 Atherosclerotic heart disease of native coronary artery without angina pectoris: Secondary | ICD-10-CM | POA: Diagnosis not present

## 2018-07-13 LAB — CBC
HCT: 32.6 % — ABNORMAL LOW (ref 36.0–46.0)
Hemoglobin: 9.9 g/dL — ABNORMAL LOW (ref 12.0–15.0)
MCH: 27.6 pg (ref 26.0–34.0)
MCHC: 30.4 g/dL (ref 30.0–36.0)
MCV: 90.8 fL (ref 80.0–100.0)
Platelets: 278 10*3/uL (ref 150–400)
RBC: 3.59 MIL/uL — ABNORMAL LOW (ref 3.87–5.11)
RDW: 22.4 % — ABNORMAL HIGH (ref 11.5–15.5)
WBC: 6.4 10*3/uL (ref 4.0–10.5)
nRBC: 0 % (ref 0.0–0.2)

## 2018-07-13 LAB — BASIC METABOLIC PANEL
Anion gap: 6 (ref 5–15)
BUN: 26 mg/dL — ABNORMAL HIGH (ref 8–23)
CO2: 25 mmol/L (ref 22–32)
Calcium: 8.9 mg/dL (ref 8.9–10.3)
Chloride: 107 mmol/L (ref 98–111)
Creatinine, Ser: 2.18 mg/dL — ABNORMAL HIGH (ref 0.44–1.00)
GFR calc Af Amer: 26 mL/min — ABNORMAL LOW (ref >60)
GFR calc non Af Amer: 22 mL/min — ABNORMAL LOW (ref >60)
Glucose, Bld: 105 mg/dL — ABNORMAL HIGH (ref 70–99)
Potassium: 4.7 mmol/L (ref 3.5–5.1)
Sodium: 138 mmol/L (ref 135–145)

## 2018-07-13 LAB — TROPONIN I: Troponin I: <0.03 ng/mL (ref <0.03)

## 2018-07-13 LAB — LIPID PANEL
Cholesterol: 87 mg/dL (ref 0–200)
HDL: 35 mg/dL — ABNORMAL LOW (ref >40)
LDL Cholesterol: 32 mg/dL (ref 0–99)
Total CHOL/HDL Ratio: 2.5 RATIO
Triglycerides: 98 mg/dL (ref <150)
VLDL: 20 mg/dL (ref 0–40)

## 2018-07-13 LAB — HIV ANTIBODY (ROUTINE TESTING W REFLEX): HIV Screen 4th Generation wRfx: NONREACTIVE

## 2018-07-13 LAB — HEMOGLOBIN A1C
Hgb A1c MFr Bld: 5.5 % (ref 4.8–5.6)
Mean Plasma Glucose: 111.15 mg/dL

## 2018-07-13 LAB — GLUCOSE, CAPILLARY
Glucose-Capillary: 88 mg/dL (ref 70–99)
Glucose-Capillary: 159 mg/dL — ABNORMAL HIGH (ref 70–99)
Glucose-Capillary: 168 mg/dL — ABNORMAL HIGH (ref 70–99)
Glucose-Capillary: 128 mg/dL — ABNORMAL HIGH (ref 70–99)

## 2018-07-13 MED ORDER — REGADENOSON 0.4 MG/5ML IV SOLN
0.4000 mg | Freq: Once | INTRAVENOUS | Status: DC
  Filled 2018-07-13: qty 5

## 2018-07-13 MED ORDER — TECHNETIUM TC 99M TETROFOSMIN IV KIT
10.0000 | PACK | Freq: Once | INTRAVENOUS | Status: AC | PRN
  Administered 2018-07-13: 08:00:00 10 via INTRAVENOUS

## 2018-07-13 MED ORDER — REGADENOSON 0.4 MG/5ML IV SOLN
INTRAVENOUS | Status: AC
  Administered 2018-07-13: 09:00:00 0.4 mg
  Filled 2018-07-13: qty 5

## 2018-07-13 MED ORDER — TECHNETIUM TC 99M TETROFOSMIN IV KIT
30.0000 | PACK | Freq: Once | INTRAVENOUS | Status: AC | PRN
  Administered 2018-07-13: 30 via INTRAVENOUS

## 2018-07-13 NOTE — Care Management Obs Status (Signed)
[facility] NOTIFICATION   Patient Details  Name: Steffany Schoenfelder MRN: [id number] Date of Birth: April 04, 1950   Medicare Observation Status Notification Given:  Yes    Carles Collet, RN 07/13/2018, 12:44 PM

## 2018-07-13 NOTE — Progress Notes (Signed)
Subjective:  Patient seen earlier this morning in nuclear medicine department complains of vague retrosternal chest pain.  Tolerated stress portion of the [employer].  Results of Myoview scan still pending.  Renal function is gradually improving.  Objective:  Vital Signs in the last 24 hours: Temp:  [98.1 F (36.7 C)-98.5 F (36.9 C)] 98.1 F (36.7 C) (06/07 1601) Pulse Rate:  [68-84] 84 (06/07 1601) Resp:  [16] 16 (06/07 0411) BP: (96-150)/(60-90) 96/60 (06/07 1601) SpO2:  [95 %-100 %] 95 % (06/07 1601) Weight:  [58.1 kg] 58.1 kg (06/07 0411)  Intake/Output from previous day: 06/06 0701 - 06/07 0700 In: 535.7 [P.O.:240; I.V.:295.7] Out: -  Intake/Output from this shift: Total I/O In: 240 [P.O.:240] Out: 200 [Urine:200]  Physical Exam: Exam unchanged   Lab Results: Recent Labs    07/12/18 1601 07/13/18 0427  WBC 7.6 6.4  HGB 11.7* 9.9*  PLT 319 278   Recent Labs    07/12/18 1030 07/12/18 1601 07/13/18 0427  NA 138  --  138  K 4.2  --  4.7  CL 104  --  107  CO2 22  --  25  GLUCOSE 217*  --  105*  BUN 25*  --  26*  CREATININE 2.38* 2.32* 2.18*   Recent Labs    07/12/18 2157 07/13/18 0427  TROPONINI <0.03 <0.03   Hepatic Function Panel Recent Labs    07/12/18 1030  PROT 8.0  ALBUMIN 3.6  AST 21  ALT 21  ALKPHOS 102  BILITOT 0.5   Recent Labs    07/13/18 0428  CHOL 87   No results for input(s): PROTIME in the last 72 hours.  Imaging:   Cardiac Studies:  Assessment/Plan:  Status post recurrent chest pain   MI ruled out Coronary artery disease history of PCI to RCA and left circumflex in the past Hypertension Insulin requiring diabetes mellitus Hyperlipidemia Malignant carcinoma of the right breast status post mastectomy GERD History of bronchial asthma History of colonic polyp Chronic kidney disease stage III Anemia of chronic disease  Plan Continue present management Discussed with patient about nuclear stress results not  available yet states would prefer to go home tomorrow if nuclear stress test okay  LOS: 0 days    [name] 07/13/2018, 5:37 PM

## 2018-07-14 DIAGNOSIS — Z20828 Contact with and (suspected) exposure to other viral communicable diseases: Secondary | ICD-10-CM | POA: Diagnosis not present

## 2018-07-14 DIAGNOSIS — I2510 Atherosclerotic heart disease of native coronary artery without angina pectoris: Secondary | ICD-10-CM | POA: Diagnosis not present

## 2018-07-14 DIAGNOSIS — E785 Hyperlipidemia, unspecified: Secondary | ICD-10-CM | POA: Diagnosis not present

## 2018-07-14 DIAGNOSIS — K219 Gastro-esophageal reflux disease without esophagitis: Secondary | ICD-10-CM | POA: Diagnosis not present

## 2018-07-14 LAB — GLUCOSE, CAPILLARY
Glucose-Capillary: 172 mg/dL — ABNORMAL HIGH (ref 70–99)
Glucose-Capillary: 87 mg/dL (ref 70–99)
Glucose-Capillary: 151 mg/dL — ABNORMAL HIGH (ref 70–99)

## 2018-07-14 MED ORDER — INSULIN GLARGINE 100 UNIT/ML ~~LOC~~ SOLN: SUBCUTANEOUS | 3 refills | Status: DC

## 2018-07-14 MED ORDER — ACETAMINOPHEN 325 MG PO TABS: 650.0000 mg | ORAL_TABLET | ORAL | 1 refills | Status: DC | PRN

## 2018-07-14 MED ORDER — HEPARIN SOD (PORK) LOCK FLUSH 100 UNIT/ML IV SOLN
500.0000 [IU] | INTRAVENOUS | Status: DC | PRN
  Administered 2018-07-14: 500 [IU]
  Filled 2018-07-14 (×2): qty 5

## 2018-07-14 MED ORDER — HEPARIN SOD (PORK) LOCK FLUSH 100 UNIT/ML IV SOLN
500.0000 [IU] | INTRAVENOUS | Status: DC
  Filled 2018-07-14: qty 5

## 2018-07-14 NOTE — Discharge Summary (Signed)
Discharge summary dictated on 07/14/2026 patient number is [phone]

## 2018-07-14 NOTE — Discharge Instructions (Signed)
Acute Coronary Syndrome    Acute coronary syndrome (ACS) is a serious problem in which there is suddenly not enough blood and oxygen reaching the heart. ACS can result in chest pain or a heart attack.  This condition is a medical emergency. If you have any symptoms of this condition, get help right away.  What are the causes?  This condition may be caused by:   Buildup of fat and cholesterol inside of the arteries (atherosclerosis). This is the most common cause. The buildup (plaque) can cause blood vessels in the heart (coronary arteries) to become narrow or blocked, which reduces blood flow to the heart. Plaque can also break off and lead to a clot, which can block an artery and cause a heart attack or stroke.   Sudden tightening of the muscles around the coronary arteries (coronary spasm).   Tearing of a coronary artery (spontaneous coronary artery dissection).   Very low blood pressure (hypotension).   An abnormal heartbeat (arrhythmia).   Other medical conditions that cause a decrease of oxygen to the heart, such as anemiaorrespiratory failure.   Using cocaine or methamphetamine.  What increases the risk?  The following factors may make you more likely to develop this condition:   Age. The risk for ACS increases as you get older.   History of chest pain, heart attack, peripheral artery disease, or stroke.   Having taken chemotherapy or immune-suppressing medicines.   Being female.   Family history of chest pain, heart disease, or stroke.   Smoking.   Not exercising enough.   Being overweight.   High cholesterol.   High blood pressure (hypertension).   Diabetes.   Excessive alcohol use.  What are the signs or symptoms?  Common symptoms of this condition include:   Chest pain. The pain may last a long time, or it may stop and come back (recur). It may feel like:  ? Crushing or squeezing.  ? Tightness, pressure, fullness, or heaviness.   Arm, neck, jaw, or back pain.   Heartburn or  indigestion.   Shortness of breath.   Nausea.   Sudden cold sweats.   Light-headedness.   Dizziness, or passing out.   Tiredness (fatigue).  Sometimes there are no symptoms.  How is this diagnosed?  This condition may be diagnosed based on:   Your medical history and symptoms.   An electrocardiogram (ECG). This imaging test measures the heart's electrical activity.   Blood tests. Cardiac blood tests may need to be repeated at designated time intervals.   Chest X-ray.   A CT scan of the chest.   A coronary angiogram. This is a procedure in which dye is injected into the bloodstream and then X-rays are taken to show if there is a blockage in a coronary artery.   Exercise stress testing.   Echocardiography. This is a test that uses sound waves to produce detailed images of the heart.  How is this treated?  The treatment is to restore blood flow to the heart as soon as possible. Treatment for this condition may include:   Oxygen therapy.   Medicines, such as:  ? Antiplatelet medicines and blood-thinning medicines, such as aspirin. These help prevent blood clots.  ? Medicine that dissolves any blood clots (fibrinolytic therapy).  ? Blood pressure medicines.  ? Nitroglycerin. This helps relieve chest pain and widens blood vessels to improve blood flow.  ? Pain medicine.  ? Cholesterol-lowering medicine.   Surgery, such as:  ? Coronary angioplasty with   stent placement. This involves placing a small piece of metal that looks like mesh or a spring into a narrow coronary artery. This widens the artery and keep it open.  ? Coronary artery bypass surgery. This involves taking a section of a blood vessel from a different part of your body, and placing it on the blocked coronary artery to allow blood to flow around (bypass) the blockage.   Cardiac rehabilitation. This is a program that helps improve your health and well-being. It includes exercise training, education, and counseling to help you recover.  Follow  these instructions at home:  Eating and drinking   Eat a heart-healthy diet that includes whole grains, fruits and vegetables, lean proteins, and low-fat or nonfat dairy products.   Limit how much salt (sodium) you eat as told by your health care provider. Follow instructions from your health care provider about any other eating or drinking restrictions, such as limiting foods that are high in fat and processed sugars.   Use healthy cooking methods such as roasting, grilling, broiling, baking, poaching, steaming, or stir-frying.   Talk with a dietitian to learn about healthy cooking methods and how to eat less sodium.  Medicines   Take over-the-counter and prescription medicines only as told by your health care provider.   Do not take these medicines unless your health care provider approves:  ? Vitamin supplements that contain vitamin A or vitamin E.  ? Nonsteroidal anti-inflammatory drugs (NSAIDs), such as ibuprofen, naproxen, or celecoxib.  ? Hormone replacement therapy that contains estrogen.  If you are taking blood thinners:   Talk with your health care provider before you take any medicines that contain aspirin or NSAIDs. These medicines increase your risk for dangerous bleeding.   Take your medicine exactly as told, at the same time every day.   Avoid activities that could cause injury or bruising, and follow instructions about how to prevent falls.   Wear a medical alert bracelet, and carry a card that lists what medicines you take.  Activity   Join a cardiac rehabilitation program. An exercise plan will be developed for you.   Ask your health care provider:  ? What activities and exercises are safe for you.  ? If you should follow specific instructions about lifting, driving, or climbing stairs.  Lifestyle   Do not use any products that contain nicotine or tobacco, such as cigarettes and e-cigarettes. If you need help quitting, ask your health care provider.   If your health care provider  says that alcohol is safe for you, limit your alcohol intake to no more than 1 drink a day. One drink equals 12 oz of beer, 5 oz of wine, or 1 oz of hard liquor.   Maintain a healthy weight. If you need to lose weight, work with your health care provider to do so safely.  General instructions   Tell all the health care providers who care for you about your heart condition, including your dentist. This may affect the medicines or treatment you receive.   Manage any other health conditions you have, such as hypertension or diabetes. These conditions affect your heart.   Learn ways to manage stress.   Get screened for depression, and get mental health treatment if you need it. People with ACS are at higher risk for depression.   Keep your vaccinations up to date. Get the flu shot (influenza vaccine) every year.   If directed, monitor your blood pressure at home.   Keep all   follow-up visits as told by your health care provider. This is important.  Contact a health care provider if:   You feel overwhelmed or sad.   You have trouble doing your daily activities.  Get help right away if:   You have pain in your chest, neck, arm, jaw, stomach, or back that recurs, and:  ? It lasts for more than a few minutes.  ? It is not relieved by taking the medicineyour health care provider prescribed.   You have unexplained:  ? Heavy sweating.  ? Heartburn or indigestion.  ? Nausea or vomiting.  ? Shortness of breath.  ? Difficulty breathing.  ? Fatigue.  ? Nervousness or anxiety.  ? Weakness.  ? Diarrhea.  ? Dark stools or blood in your stool.   You have sudden light-headedness or dizziness.   Your blood pressure is higher than 180/120.   You faint.   You have thoughts about hurting yourself.  These symptoms may represent a serious problem that is an emergency. Do not wait to see if the symptoms will go away. Get medical help right away. Call your local emergency services (911 in the [place] Do not drive yourself to the  hospital.  If you ever feel like you may hurt yourself or others, or have thoughts about taking your own life, get help right away. You can go to your nearest emergency department or call:   Emergency services (911 in the [place]   A suicide crisis helpline, such as the National Suicide Prevention Lifeline at 1-800-273-8255. This is open 24 hours a day.  Summary   Acute coronary syndrome (ACS) is when there is not enough blood and oxygen being supplied to the heart. ACS can result in chest pain or a heart attack.   Acute coronary syndrome is a medical emergency. If you have any symptoms of this condition, get help right away.   Treatment includes medicines and procedures to open the blocked arteries and restore blood flow.  This information is not intended to replace advice given to you by your health care provider. Make sure you discuss any questions you have with your health care provider.  Document Released: 01/22/2005 Document Revised: 10/02/2016 Document Reviewed: 10/02/2016  Elsevier Interactive Patient Education  2019 [employer]

## 2018-07-15 NOTE — Discharge Summary (Signed)
NAME: [name], [name] [facility] MEDICAL RECORD WH:[id number] ACCOUNT [id number] DATE OF BIRTH:22-Mar-1950 FACILITY: [facility] LOCATION: [id number] PHYSICIAN:Jamorris Ndiaye Daivd Council, MD  DISCHARGE SUMMARY  DATE OF DISCHARGE:  07/14/2018  ADMITTING DIAGNOSES: 1.  Recurrent chest pain worrisome for angina, rule out myocardial infarction, rule out progression of coronary artery disease. 2.  Coronary artery disease, history of percutaneous coronary intervention to right coronary artery and left circumflex in the past. 3.  Hypertension. 4.  Insulin-requiring diabetes mellitus. 5.  Hyperlipidemia. 6.  Malignant carcinoma of the right breast, status post mastectomy. 7.  Gastroesophageal reflux disease. 8.  History of bronchial asthma. 9.  History of colonic polyp. 10.  Chronic kidney disease, stage III. 11.  Anemia of chronic disease.  DISCHARGE DIAGNOSES: 1.  Stable angina, myocardial infarction ruled out.  Negative nuclear Myoview with no evidence of ischemia, although her ejection fraction has reduced to 46%. 2.  Coronary artery disease, history of percutaneous coronary intervention to right coronary artery and left circumflex in the past. 3.  Hypertension. 4.  Insulin-requiring diabetes mellitus. 5.  Hyperlipidemia. 6.  Malignant carcinoma of the right breast, status post mastectomy. 7.  Gastroesophageal reflux disease. 8.  History of bronchial asthma. 9.  History of colonic polyps. 10.  Chronic kidney disease, stage III, stable. 11.  Anemia of chronic disease.  DISCHARGE HOME MEDICATIONS: 1.  Tylenol 650 mg every 4 hours as needed for pain. 2.  Lantus insulin 10 units subcu daily at night. 3.  Xanax 0.25 mg twice daily. 4.  Amlodipine 5 mg daily. 5.  Arimidex 1 mg daily. 6.  Aspirin 81 mg daily. 7.  Atorvastatin 20 mg daily. 8.  Symbicort 1 puff twice daily. 9.  Feeding supplement Ensure twice daily. 10.  Fish oil 1000 mg daily. 11.  Imodium 2 mg every 4 hours as needed for diarrhea. 12.   Metoprolol succinate 50 mg daily. 13.  Zofran 8 mg every 8 hours as needed for nausea and vomiting. 14.  Pantoprazole 40 mg 1 tablet daily. 15.  Nitrostat 0.4 mg sublingual p.r.n. 16.  Albuterol inhaler as before.  DIET:  Low-salt, low-cholesterol 1800 calories ADA diet.  CONDITION AT DISCHARGE:  Stable.  The patient has been advised to monitor blood pressure and blood sugar regularly and chart.  CONDITION AT DISCHARGE:  Stable.  BRIEF HISTORY AND HOSPITAL COURSE:  The patient is a 68 year old female with a past medical history significant for multiple medical problems; i.e., coronary artery disease status post PTCA stenting to RCA and left circumflex in 2008 and 2009,  hypertension, insulin-requiring diabetes mellitus, malignant right breast carcinoma, status post mastectomy, hyperlipidemia, GERD, history of tobacco abuse, COPD, history of bronchial asthma, chronic anemia, chronic kidney disease, stage III.  She came  to the ER complaining of retrosternal and left-sided chest pain radiating occasionally to left arm off and on since yesterday.  The patient denies any nausea, vomiting, diaphoresis.  Denies any shortness of breath.  Denies palpitation, lightheadedness or  syncope.  The patient is very vague in her symptoms.  States she took 3 sublingual nitro yesterday with relief.  States chest pain lasts for a few minutes to a few hours.  Denies any recent stress test.  The patient had recently 2D echo for evaluation  for chemotherapy, which showed a near normal LV systolic function.  PHYSICAL EXAMINATION: GENERAL:  She was alert, awake, oriented x3. VITAL SIGNS:  Blood pressure was 173/76, pulse 74 regular. HEENT:  Conjunctivae pink. NECK:  Supple, no JVD, no bruit. LUNGS:  She had decreased breath sound at bases due to poor effort. CARDIOVASCULAR:  S1, S2 was normal.  There was soft systolic and diastolic murmur noted. ABDOMEN:  Soft.  Bowel sounds were present, nontender. EXTREMITIES:   There was no clubbing, cyanosis or edema.  LABORATORY DATA:  Sodium was 138, potassium 4.2, glucose 217, BUN 25, creatinine 2.38.  Three sets of troponin I were normal.  BNP was 25.3.  Hemoglobin was 10.7, hematocrit 35.9, white count of 6.3.  EKG showed normal sinus rhythm with no acute ischemic  changes.  Repeat electrolytes:  Sodium 138, potassium 4.7, glucose 105, BUN 26, creatinine is trending down ____.  Her cholesterol was 87, HDL 35, LDL 32, triglycerides 98.  Lexiscan Myoview showed decreased activity in inferolateral wall on both rest  and stress images.  No evidence of reversible ischemia.  EF of 46%.  BRIEF HOSPITAL COURSE:  The patient was admitted to telemetry unit.  MI was ruled out by serial enzymes and EKG.  The patient did not have any significant anginal chest pain during the hospital stay.  The patient subsequently underwent Lexiscan Myoview  as above, which showed no evidence of reversible ischemia.  The patient will be discharged home on above medications.  The patient has been advised to stop metformin in view of worsening renal function and continue with Lantus insulin and adjust the dose  accordingly as before.  LN/NUANCE D:07/14/2018 T:07/14/2018 JOB:[id number]

## 2018-07-22 ENCOUNTER — Telehealth: Payer: Self-pay

## 2018-07-22 NOTE — Telephone Encounter (Signed)
Per Dr. Lindi Adie hold treatment on 6/18 due to low EF.    MD will collaborate will cardiologist for review.  RN notified patient, pt voiced understanding.

## 2018-07-24 ENCOUNTER — Inpatient Hospital Stay: Payer: Medicare Other

## 2018-08-06 ENCOUNTER — Other Ambulatory Visit: Payer: Self-pay | Admitting: Hematology and Oncology

## 2018-08-11 NOTE — Assessment & Plan Note (Signed)
11/27/2017 right mastectomy: IDC grade 2, 1.6 cm, separate foci of DCIS intermediate grade, margins negative, 0/5 lymph nodes negative,ER 100%, PR 50%, Ki-67 15%, HER-2 positive ratio 2, copy #5 T1c N0 stage Ia  Treatment plan: 1. Adjuvant Herceptin every 3 weeks for 1 year (because of poor performance status Taxol is felt to be intolerable for her) 2. Adjuvant antiestrogen therapy with letrozole 2.5 mg daily  Patient's daughter is the primary point of contact for her and all appointment changes will need to go through her. -------------------------------------------------------------------------------------- Current treatment:Herceptin with anastrozole started 02/07/2018 Echo 02/17/2018 EF 55-60%--followed by Bensimhon  Tolerating anastrozole and Herceptin well. Will continue this. Hospitalization 07/14/2018: Chest pain  Severe anemia: GivenPRBCspreviously.Today's hemoglobin is 10.4. Itching sensation on the right chest wall: I instructed her to apply moisturizing cream. Numbness in the right axillary area: I discussed with her that this can stay for a long time.  Return to clinic in 3 weeks for Herceptin every 6 weeks for follow-up with me

## 2018-08-13 NOTE — Progress Notes (Signed)
Patient Care Team: Tsosie Billing, MD (Inactive) as PCP - General (Internal Medicine)  DIAGNOSIS:    ICD-10-CM   1. Malignant neoplasm of upper-outer quadrant of right breast in female, estrogen receptor positive ([facility])  C50.411    Z17.0     SUMMARY OF ONCOLOGIC HISTORY: Oncology History  Malignant neoplasm of upper-outer quadrant of right breast in female, estrogen receptor positive ([facility])  09/03/2017 Initial Diagnosis   Screening mammogram detected 1.4 cm right breast mass at 10 o'clock position, additional pleomorphic calcifications 2 cm posteriorly, additional loosely grouped calcifications 10.1 x 7.1 x 5.3 cm, single right axillary lymph node: Biopsy revealed DCIS ER 100%, PR 50%; IDC grade 2-3 ER 100%, PR 50%, Ki-67 15%, HER-2 positive ratio 2, copy #5, lymph node negative T2 N0 stage Ia clinical stage AJCC 8   09/03/2017 Cancer Staging   Staging form: Breast, AJCC 8th Edition - Clinical stage from 09/03/2017: Stage IB (cT2, cN0, cM0, G3, ER+, PR+, HER2+) - Signed by Gardenia Phlegm, NP on 04/10/2018   11/27/2017 Surgery   Right mastectomy: IDC grade 2, 1.6 cm, separate foci of DCIS intermediate grade, margins negative, 0/5 lymph nodes negative,ER 100%, PR 50%, Ki-67 15%, HER-2 positive ratio 2, copy #5 T1c N0 stage Ia   11/27/2017 Cancer Staging   Staging form: Breast, AJCC 8th Edition - Pathologic stage from 11/27/2017: Stage IA (pT1c, pN0, cM0, G2, ER+, PR+, HER2+) - Signed by Gardenia Phlegm, NP on 04/10/2018   02/07/2018 -  Chemotherapy   The patient had trastuzumab (HERCEPTIN) 483 mg in sodium chloride 0.9 % 250 mL chemo infusion, 8 mg/kg = 483 mg, Intravenous,  Once, 8 of 18 cycles Administration: 483 mg (02/07/2018), 357 mg (02/27/2018), 357 mg (05/01/2018), 357 mg (05/23/2018), 357 mg (06/12/2018), 357 mg (07/03/2018), 357 mg (03/20/2018), 357 mg (04/10/2018)  for chemotherapy treatment.    02/27/2018 -  Anti-estrogen oral therapy   Anastrozole 20m daily      CHIEF COMPLIANT: Follow-up on Herceptin with anastrozole  INTERVAL HISTORY: [name] a 68y.o. with above-mentioned history of right breast cancer who is currently on adjuvant therapy with Herceptin along with anastrozole. Echo on 07/10/18 showed an ejection fraction in the range of 55-60%. She presented to the ED on 07/12/18 for chest pain and was admitted until 07/14/18. She presents to the clinic today for treatment.  Her chest pain has resolved.  She was also anemic in the hospital and her anemia has improved significantly. Uses a walker to get around.  REVIEW OF SYSTEMS:   Constitutional: Denies fevers, chills or abnormal weight loss Eyes: Denies blurriness of vision Ears, nose, mouth, throat, and face: Denies mucositis or sore throat Respiratory: Denies cough, dyspnea or wheezes Cardiovascular: Denies palpitation, chest discomfort Gastrointestinal: Denies nausea, heartburn or change in bowel habits Skin: Denies abnormal skin rashes Lymphatics: Denies new lymphadenopathy or easy bruising Neurological: Denies numbness, tingling or new weaknesses Behavioral/Psych: Mood is stable, no new changes  Extremities: No lower extremity edema Breast: She complains of slight swelling around the mastectomy site All other systems were reviewed with the patient and are negative.  I have reviewed the past medical history, past surgical history, social history and family history with the patient and they are unchanged from previous note.  ALLERGIES:  has No Known Allergies.  MEDICATIONS:  Current Outpatient Medications  Medication Sig Dispense Refill   acetaminophen (TYLENOL) 325 MG tablet Take 2 tablets (650 mg total) by mouth every 4 (four) hours as  needed for headache or mild pain. 60 tablet 1   albuterol (PROVENTIL) (2.5 MG/3ML) 0.083% nebulizer solution INHALE THE CONTENTS OF 1 VIAL USING NEBULIZER EVERY 6 HOURS AS NEEDEDFOR WHEEZING OR SHORTNESS OF BREATH 75 mL 2   ALPRAZolam  (XANAX) 0.25 MG tablet Take 0.25 mg by mouth 2 (two) times daily.      amLODipine (NORVASC) 5 MG tablet Take 5 mg by mouth daily.     anastrozole (ARIMIDEX) 1 MG tablet Take 1 tablet (1 mg total) by mouth daily. 90 tablet 3   aspirin EC 81 MG EC tablet Take 1 tablet (81 mg total) by mouth daily. 30 tablet 0   atorvastatin (LIPITOR) 20 MG tablet Take 20 mg by mouth daily.     budesonide-formoterol (SYMBICORT) 160-4.5 MCG/ACT inhaler Inhale 1 puff into the lungs 2 (two) times daily.      feeding supplement, ENSURE ENLIVE, (ENSURE ENLIVE) LIQD Take 237 mLs by mouth 2 (two) times daily between meals. 60 Bottle 0   insulin glargine (LANTUS) 100 UNIT/ML injection 10 units subcutaneous daily at night 10 mL 3   loperamide (IMODIUM) 2 MG capsule Take 1 capsule (2 mg total) by mouth 4 (four) times daily as needed for diarrhea or loose stools. 12 capsule 0   metoprolol succinate (TOPROL-XL) 50 MG 24 hr tablet Take 50 mg by mouth at bedtime. Take with or immediately following a meal.     nitroGLYCERIN (NITROSTAT) 0.4 MG SL tablet Place 1 tablet (0.4 mg total) under the tongue every 5 (five) minutes as needed. For chest pain (Patient taking differently: Place 0.4 mg under the tongue every 5 (five) minutes as needed for chest pain. ) 10 tablet 0   Omega-3 Fatty Acids (FISH OIL) 1000 MG CAPS Take 1,000 mg by mouth daily.     ondansetron (ZOFRAN) 8 MG tablet Take 1 tablet (8 mg total) by mouth every 8 (eight) hours as needed for nausea or vomiting. 20 tablet 0   pantoprazole (PROTONIX) 40 MG tablet Take 1 tablet (40 mg total) by mouth daily. 60 tablet 0   No current facility-administered medications for this visit.     PHYSICAL EXAMINATION: ECOG PERFORMANCE STATUS: 1 - Symptomatic but completely ambulatory  Vitals:   08/14/18 1310  BP: 137/75  Pulse: 74  Resp: 17  Temp: 98.9 F (37.2 C)  SpO2: 100%   Filed Weights   08/14/18 1310  Weight: 128 lb (58.1 kg)    GENERAL: alert, no  distress and comfortable SKIN: skin color, texture, turgor are normal, no rashes or significant lesions EYES: normal, Conjunctiva are pink and non-injected, sclera clear OROPHARYNX: no exudate, no erythema and lips, buccal mucosa, and tongue normal  NECK: supple, thyroid normal size, non-tender, without nodularity LYMPH: no palpable lymphadenopathy in the cervical, axillary or inguinal LUNGS: clear to auscultation and percussion with normal breathing effort HEART: regular rate & rhythm and no murmurs and no lower extremity edema ABDOMEN: abdomen soft, non-tender and normal bowel sounds MUSCULOSKELETAL: no cyanosis of digits and no clubbing  NEURO: alert & oriented x 3 with fluent speech, no focal motor/sensory deficits EXTREMITIES: No lower extremity edema  LABORATORY DATA:  I have reviewed the data as listed CMP Latest Ref Rng & Units 07/13/2018 07/12/2018 07/12/2018  Glucose 70 - 99 mg/dL 105(H) - 217(H)  BUN 8 - 23 mg/dL 26(H) - 25(H)  Creatinine 0.44 - 1.00 mg/dL 2.18(H) 2.32(H) 2.38(H)  Sodium 135 - 145 mmol/L 138 - 138  Potassium 3.5 - 5.1 mmol/L  4.7 - 4.2  Chloride 98 - 111 mmol/L 107 - 104  CO2 22 - 32 mmol/L 25 - 22  Calcium 8.9 - 10.3 mg/dL 8.9 - 9.4  Total Protein 6.5 - 8.1 g/dL - - 8.0  Total Bilirubin 0.3 - 1.2 mg/dL - - 0.5  Alkaline Phos 38 - 126 U/L - - 102  AST 15 - 41 U/L - - 21  ALT 0 - 44 U/L - - 21    Lab Results  Component Value Date   WBC 5.9 08/14/2018   HGB 11.4 (L) 08/14/2018   HCT 36.0 08/14/2018   MCV 93.0 08/14/2018   PLT 280 08/14/2018   NEUTROABS 3.1 08/14/2018    ASSESSMENT & PLAN:  Malignant neoplasm of upper-outer quadrant of right breast in female, estrogen receptor positive ([facility]) 11/27/2017 right mastectomy: IDC grade 2, 1.6 cm, separate foci of DCIS intermediate grade, margins negative, 0/5 lymph nodes negative,ER 100%, PR 50%, Ki-67 15%, HER-2 positive ratio 2, copy #5 T1c N0 stage Ia  Treatment plan: 1. Adjuvant Herceptin every 3 weeks  for 1 year (because of poor performance status Taxol is felt to be intolerable for her) 2. Adjuvant antiestrogen therapy with letrozole 2.5 mg daily  Patient's daughter is the primary point of contact for her and all appointment changes will need to go through her. -------------------------------------------------------------------------------------- Current treatment:Herceptin with anastrozole started 02/07/2018 Echo 02/17/2018 EF 55-60%--followed by Bensimhon  Tolerating anastrozole and Herceptin well. Will continue this. Hospitalization 07/14/2018: Chest pain  Severe anemia: GivenPRBCspreviously.Today's hemoglobin is 11.4. Itching sensation on the right chest wall: I instructed her to apply moisturizing cream.  There is also slight swelling over that area. Numbness in the right axillary area: I discussed with her that this can stay for a long time.  She does not need labs every single visit.  He only needs labs whenever she comes to see me. Return to clinic in 3 weeks for Herceptin every 6 weeks for follow-up with me    No orders of the defined types were placed in this encounter.  The patient has a good understanding of the overall plan. she agrees with it. she will call with any problems that may develop before the next visit here.  Nicholas Lose, MD 08/14/2018  Julious Oka Dorshimer am acting as scribe for Dr. Nicholas Lose.  I have reviewed the above documentation for accuracy and completeness, and I agree with the above.

## 2018-08-14 ENCOUNTER — Inpatient Hospital Stay: Payer: Medicare Other | Attending: Oncology

## 2018-08-14 ENCOUNTER — Inpatient Hospital Stay (HOSPITAL_BASED_OUTPATIENT_CLINIC_OR_DEPARTMENT_OTHER): Payer: Medicare Other | Admitting: Hematology and Oncology

## 2018-08-14 ENCOUNTER — Other Ambulatory Visit: Payer: Self-pay

## 2018-08-14 ENCOUNTER — Inpatient Hospital Stay: Payer: Medicare Other

## 2018-08-14 DIAGNOSIS — Z170 Estrogen receptor positive status [ER+]: Secondary | ICD-10-CM

## 2018-08-14 DIAGNOSIS — D649 Anemia, unspecified: Secondary | ICD-10-CM | POA: Diagnosis not present

## 2018-08-14 DIAGNOSIS — Z79811 Long term (current) use of aromatase inhibitors: Secondary | ICD-10-CM | POA: Diagnosis not present

## 2018-08-14 DIAGNOSIS — Z5112 Encounter for antineoplastic immunotherapy: Secondary | ICD-10-CM | POA: Diagnosis present

## 2018-08-14 DIAGNOSIS — Z79899 Other long term (current) drug therapy: Secondary | ICD-10-CM | POA: Insufficient documentation

## 2018-08-14 DIAGNOSIS — C50411 Malignant neoplasm of upper-outer quadrant of right female breast: Secondary | ICD-10-CM

## 2018-08-14 DIAGNOSIS — Z95828 Presence of other vascular implants and grafts: Secondary | ICD-10-CM

## 2018-08-14 LAB — CMP (CANCER CENTER ONLY)
ALT: 16 U/L (ref 0–44)
AST: 15 U/L (ref 15–41)
Albumin: 3.5 g/dL (ref 3.5–5.0)
Alkaline Phosphatase: 138 U/L — ABNORMAL HIGH (ref 38–126)
Anion gap: 8 (ref 5–15)
BUN: 29 mg/dL — ABNORMAL HIGH (ref 8–23)
CO2: 25 mmol/L (ref 22–32)
Calcium: 9.5 mg/dL (ref 8.9–10.3)
Chloride: 106 mmol/L (ref 98–111)
Creatinine: 2.27 mg/dL — ABNORMAL HIGH (ref 0.44–1.00)
GFR, Est AFR Am: 25 mL/min — ABNORMAL LOW (ref >60)
GFR, Estimated: 21 mL/min — ABNORMAL LOW (ref >60)
Glucose, Bld: 215 mg/dL — ABNORMAL HIGH (ref 70–99)
Potassium: 4.5 mmol/L (ref 3.5–5.1)
Sodium: 139 mmol/L (ref 135–145)
Total Bilirubin: 0.4 mg/dL (ref 0.3–1.2)
Total Protein: 8.5 g/dL — ABNORMAL HIGH (ref 6.5–8.1)

## 2018-08-14 LAB — CBC WITH DIFFERENTIAL (CANCER CENTER ONLY)
Abs Immature Granulocytes: 0.05 10*3/uL (ref 0.00–0.07)
Basophils Absolute: 0 10*3/uL (ref 0.0–0.1)
Basophils Relative: 1 %
Eosinophils Absolute: 0.1 10*3/uL (ref 0.0–0.5)
Eosinophils Relative: 2 %
HCT: 36 % (ref 36.0–46.0)
Hemoglobin: 11.4 g/dL — ABNORMAL LOW (ref 12.0–15.0)
Immature Granulocytes: 1 %
Lymphocytes Relative: 33 %
Lymphs Abs: 1.9 10*3/uL (ref 0.7–4.0)
MCH: 29.5 pg (ref 26.0–34.0)
MCHC: 31.7 g/dL (ref 30.0–36.0)
MCV: 93 fL (ref 80.0–100.0)
Monocytes Absolute: 0.7 10*3/uL (ref 0.1–1.0)
Monocytes Relative: 11 %
Neutro Abs: 3.1 10*3/uL (ref 1.7–7.7)
Neutrophils Relative %: 52 %
Platelet Count: 280 10*3/uL (ref 150–400)
RBC: 3.87 MIL/uL (ref 3.87–5.11)
RDW: 17.4 % — ABNORMAL HIGH (ref 11.5–15.5)
WBC Count: 5.9 10*3/uL (ref 4.0–10.5)
nRBC: 0 % (ref 0.0–0.2)

## 2018-08-14 MED ORDER — HEPARIN SOD (PORK) LOCK FLUSH 100 UNIT/ML IV SOLN
500.0000 [IU] | Freq: Once | INTRAVENOUS | Status: AC | PRN
  Administered 2018-08-14: 500 [IU]
  Filled 2018-08-14: qty 5

## 2018-08-14 MED ORDER — ACETAMINOPHEN 325 MG PO TABS
650.0000 mg | ORAL_TABLET | Freq: Once | ORAL | Status: AC
  Administered 2018-08-14: 650 mg via ORAL

## 2018-08-14 MED ORDER — SODIUM CHLORIDE 0.9% FLUSH
10.0000 mL | INTRAVENOUS | Status: DC | PRN
  Administered 2018-08-14: 10 mL
  Filled 2018-08-14: qty 10

## 2018-08-14 MED ORDER — SODIUM CHLORIDE 0.9 % IV SOLN
Freq: Once | INTRAVENOUS | Status: AC
  Administered 2018-08-14: 14:00:00 via INTRAVENOUS
  Filled 2018-08-14: qty 250

## 2018-08-14 MED ORDER — DIPHENHYDRAMINE HCL 25 MG PO CAPS
50.0000 mg | ORAL_CAPSULE | Freq: Once | ORAL | Status: AC
  Administered 2018-08-14: 50 mg via ORAL

## 2018-08-14 MED ORDER — TRASTUZUMAB CHEMO 150 MG IV SOLR
6.0000 mg/kg | Freq: Once | INTRAVENOUS | Status: AC
  Administered 2018-08-14: 357 mg via INTRAVENOUS
  Filled 2018-08-14: qty 17

## 2018-08-14 MED ORDER — SODIUM CHLORIDE 0.9% FLUSH
10.0000 mL | INTRAVENOUS | Status: DC | PRN
  Administered 2018-08-14: 13:00:00 10 mL
  Filled 2018-08-14: qty 10

## 2018-08-14 MED ORDER — ACETAMINOPHEN 325 MG PO TABS
ORAL_TABLET | ORAL | Status: AC
  Filled 2018-08-14: qty 2

## 2018-08-14 MED ORDER — DIPHENHYDRAMINE HCL 25 MG PO CAPS
ORAL_CAPSULE | ORAL | Status: AC
  Filled 2018-08-14: qty 2

## 2018-08-14 NOTE — Patient Instructions (Signed)

## 2018-08-14 NOTE — Progress Notes (Signed)
Proceed w/ Herceptin per Dr. Cristi Loron, Pharm.D., CPP 08/14/2018@2 :23 PM

## 2018-08-14 NOTE — Patient Instructions (Signed)
Coronavirus (COVID-19) Are you at risk?  Are you at risk for the Coronavirus (COVID-19)?  To be considered HIGH RISK for Coronavirus (COVID-19), you have to meet the following criteria:  . Traveled to [place], [place], [place], [place] or [place]; or in the [place] to [place], [place], [place], or [place]; and have fever, cough, and shortness of breath within the last 2 weeks of travel OR . Been in close contact with a person diagnosed with COVID-19 within the last 2 weeks and have fever, cough, and shortness of breath . IF YOU DO NOT MEET THESE CRITERIA, YOU ARE CONSIDERED LOW RISK FOR COVID-19.  What to do if you are HIGH RISK for COVID-19?  . If you are having a medical emergency, call 911. . Seek medical care right away. Before you go to a doctor's office, urgent care or emergency department, call ahead and tell them about your recent travel, contact with someone diagnosed with COVID-19, and your symptoms. You should receive instructions from your physician's office regarding next steps of care.  . When you arrive at healthcare provider, tell the healthcare staff immediately you have returned from visiting [place], [place], [place], [place] or [place]; or traveled in the [place] to [place], [place], [place], or [place]; in the last two weeks or you have been in close contact with a person diagnosed with COVID-19 in the last 2 weeks.   . Tell the health care staff about your symptoms: fever, cough and shortness of breath. . After you have been seen by a medical provider, you will be either: o Tested for (COVID-19) and discharged home on quarantine except to seek medical care if symptoms worsen, and asked to  - Stay home and avoid contact with others until you get your results (4-5 days)  - Avoid travel on public transportation if possible (such as bus, train, or airplane) or o Sent to the Emergency Department by EMS for evaluation, COVID-19 testing, and possible  admission depending on your condition and test results.  What to do if you are LOW RISK for COVID-19?  Reduce your risk of any infection by using the same precautions used for avoiding the common cold or flu:  . Wash your hands often with soap and warm water for at least 20 seconds.  If soap and water are not readily available, use an alcohol-based hand sanitizer with at least 60% alcohol.  . If coughing or sneezing, cover your mouth and nose by coughing or sneezing into the elbow areas of your shirt or coat, into a tissue or into your sleeve (not your hands). . Avoid shaking hands with others and consider head nods or verbal greetings only. . Avoid touching your eyes, nose, or mouth with unwashed hands.  . Avoid close contact with people who are sick. . Avoid places or events with large numbers of people in one location, like concerts or sporting events. . Carefully consider travel plans you have or are making. . If you are planning any travel outside or inside the [place], visit the CDC's Travelers' Health webpage for the latest health notices. . If you have some symptoms but not all symptoms, continue to monitor at home and seek medical attention if your symptoms worsen. . If you are having a medical emergency, call 911.   ADDITIONAL HEALTHCARE OPTIONS FOR PATIENTS  [facility] / e-Visit: [email or web address]         [facility]: [phone]  [facility]: [phone]                   [facility]: [phone]    [facility] Discharge Instructions for Patients Receiving Chemotherapy  Today you received the following chemotherapy agents Herceptin   To help prevent nausea and vomiting after your treatment, we encourage you to take your nausea medication as directed.    If you develop nausea and vomiting that is not controlled by your nausea medication, call the clinic.   BELOW  ARE SYMPTOMS THAT SHOULD BE REPORTED IMMEDIATELY:  *FEVER GREATER THAN 100.5 F  *CHILLS WITH OR WITHOUT FEVER  NAUSEA AND VOMITING THAT IS NOT CONTROLLED WITH YOUR NAUSEA MEDICATION  *UNUSUAL SHORTNESS OF BREATH  *UNUSUAL BRUISING OR BLEEDING  TENDERNESS IN MOUTH AND THROAT WITH OR WITHOUT PRESENCE OF ULCERS  *URINARY PROBLEMS  *BOWEL PROBLEMS  UNUSUAL RASH Items with * indicate a potential emergency and should be followed up as soon as possible.  Feel free to call the clinic should you have any questions or concerns. The clinic phone number is [phone].  Please show the CHEMO ALERT CARD at check-in to the Emergency Department and triage nurse.   

## 2018-09-04 ENCOUNTER — Other Ambulatory Visit: Payer: Self-pay

## 2018-09-04 ENCOUNTER — Other Ambulatory Visit: Payer: Medicare Other

## 2018-09-04 ENCOUNTER — Other Ambulatory Visit: Payer: Self-pay | Admitting: Hematology and Oncology

## 2018-09-04 ENCOUNTER — Inpatient Hospital Stay: Payer: Medicare Other

## 2018-09-04 VITALS — BP 142/80 | HR 70 | Temp 98.5°F | Resp 19 | Ht 59.0 in | Wt 129.0 lb

## 2018-09-04 DIAGNOSIS — C50411 Malignant neoplasm of upper-outer quadrant of right female breast: Secondary | ICD-10-CM

## 2018-09-04 DIAGNOSIS — Z170 Estrogen receptor positive status [ER+]: Secondary | ICD-10-CM

## 2018-09-04 DIAGNOSIS — Z5112 Encounter for antineoplastic immunotherapy: Secondary | ICD-10-CM | POA: Diagnosis not present

## 2018-09-04 MED ORDER — SODIUM CHLORIDE 0.9% FLUSH
10.0000 mL | INTRAVENOUS | Status: DC | PRN
  Administered 2018-09-04: 10 mL
  Filled 2018-09-04: qty 10

## 2018-09-04 MED ORDER — HEPARIN SOD (PORK) LOCK FLUSH 100 UNIT/ML IV SOLN
500.0000 [IU] | Freq: Once | INTRAVENOUS | Status: AC | PRN
  Administered 2018-09-04: 500 [IU]
  Filled 2018-09-04: qty 5

## 2018-09-04 MED ORDER — TRASTUZUMAB CHEMO 150 MG IV SOLR
6.0000 mg/kg | Freq: Once | INTRAVENOUS | Status: AC
  Administered 2018-09-04: 357 mg via INTRAVENOUS
  Filled 2018-09-04: qty 17

## 2018-09-04 MED ORDER — DIPHENHYDRAMINE HCL 25 MG PO CAPS
ORAL_CAPSULE | ORAL | Status: AC
  Filled 2018-09-04: qty 2

## 2018-09-04 MED ORDER — SODIUM CHLORIDE 0.9 % IV SOLN
Freq: Once | INTRAVENOUS | Status: AC
  Administered 2018-09-04: 14:00:00 via INTRAVENOUS
  Filled 2018-09-04: qty 250

## 2018-09-04 MED ORDER — ACETAMINOPHEN 325 MG PO TABS
ORAL_TABLET | ORAL | Status: AC
  Filled 2018-09-04: qty 2

## 2018-09-04 MED ORDER — ACETAMINOPHEN 325 MG PO TABS
650.0000 mg | ORAL_TABLET | Freq: Once | ORAL | Status: AC
  Administered 2018-09-04: 650 mg via ORAL

## 2018-09-04 MED ORDER — DIPHENHYDRAMINE HCL 25 MG PO CAPS
50.0000 mg | ORAL_CAPSULE | Freq: Once | ORAL | Status: AC
  Administered 2018-09-04: 50 mg via ORAL

## 2018-09-04 NOTE — Patient Instructions (Signed)
**Pick up Miralax at pharmacy or drug store and mis daily in any drink to help with constipation**   [facility] Discharge Instructions for Patients Receiving Chemotherapy  Today you received the following chemotherapy agents: Trastuzumab (Herceptin)  To help prevent nausea and vomiting after your treatment, we encourage you to take your nausea medication as directed.   If you develop nausea and vomiting that is not controlled by your nausea medication, call the clinic.   BELOW ARE SYMPTOMS THAT SHOULD BE REPORTED IMMEDIATELY:  *FEVER GREATER THAN 100.5 F  *CHILLS WITH OR WITHOUT FEVER  NAUSEA AND VOMITING THAT IS NOT CONTROLLED WITH YOUR NAUSEA MEDICATION  *UNUSUAL SHORTNESS OF BREATH  *UNUSUAL BRUISING OR BLEEDING  TENDERNESS IN MOUTH AND THROAT WITH OR WITHOUT PRESENCE OF ULCERS  *URINARY PROBLEMS  *BOWEL PROBLEMS  UNUSUAL RASH Items with * indicate a potential emergency and should be followed up as soon as possible.  Feel free to call the clinic should you have any questions or concerns. The clinic phone number is [phone].  Please show the [place] at check-in to the Emergency Department and triage nurse.  Coronavirus (COVID-19) Are you at risk?  Are you at risk for the Coronavirus (COVID-19)?  To be considered HIGH RISK for Coronavirus (COVID-19), you have to meet the following criteria:  . Traveled to [place], [place], [place], [place] or [place]; or in the [place] to [place], [place], [place], or [place]; and have fever, cough, and shortness of breath within the last 2 weeks of travel OR . Been in close contact with a person diagnosed with COVID-19 within the last 2 weeks and have fever, cough, and shortness of breath . IF YOU DO NOT MEET THESE CRITERIA, YOU ARE CONSIDERED LOW RISK FOR COVID-19.  What to do if you are HIGH RISK for COVID-19?  Marland Kitchen If you are having a medical emergency, call 911. . Seek medical care right  away. Before you go to a doctor's office, urgent care or emergency department, call ahead and tell them about your recent travel, contact with someone diagnosed with COVID-19, and your symptoms. You should receive instructions from your physician's office regarding next steps of care.  . When you arrive at healthcare provider, tell the healthcare staff immediately you have returned from visiting [place], [place], [place], [place] or [place]; or traveled in the [place] to [place], [place], [place], or [place]; in the last two weeks or you have been in close contact with a person diagnosed with COVID-19 in the last 2 weeks.   . Tell the health care staff about your symptoms: fever, cough and shortness of breath. . After you have been seen by a medical provider, you will be either: o Tested for (COVID-19) and discharged home on quarantine except to seek medical care if symptoms worsen, and asked to  - Stay home and avoid contact with others until you get your results (4-5 days)  - Avoid travel on public transportation if possible (such as bus, train, or airplane) or o Sent to the Emergency Department by EMS for evaluation, COVID-19 testing, and possible admission depending on your condition and test results.  What to do if you are LOW RISK for COVID-19?  Reduce your risk of any infection by using the same precautions used for avoiding the common cold or flu:  Marland Kitchen Wash your hands often with soap and warm water for at least 20 seconds.  If soap and  water are not readily available, use an alcohol-based hand sanitizer with at least 60% alcohol.  . If coughing or sneezing, cover your mouth and nose by coughing or sneezing into the elbow areas of your shirt or coat, into a tissue or into your sleeve (not your hands). . Avoid shaking hands with others and consider head nods or verbal greetings only. . Avoid touching your eyes, nose, or mouth with unwashed hands.  . Avoid close contact with people who  are sick. . Avoid places or events with large numbers of people in one location, like concerts or sporting events. . Carefully consider travel plans you have or are making. . If you are planning any travel outside or inside the [place], visit the CDC's Travelers' Health webpage for the latest health notices. . If you have some symptoms but not all symptoms, continue to monitor at home and seek medical attention if your symptoms worsen. . If you are having a medical emergency, call 911.   [facility] / e-Visit: [email or web address]         [facility]: [facility]: [phone]                   [facility]: [phone]

## 2018-09-18 ENCOUNTER — Emergency Department (HOSPITAL_COMMUNITY): Payer: Medicare Other

## 2018-09-18 ENCOUNTER — Other Ambulatory Visit: Payer: Self-pay

## 2018-09-18 ENCOUNTER — Inpatient Hospital Stay (HOSPITAL_COMMUNITY): Payer: Medicare Other

## 2018-09-18 ENCOUNTER — Inpatient Hospital Stay (HOSPITAL_COMMUNITY)
Admission: EM | Admit: 2018-09-18 | Discharge: 2018-09-25 | DRG: 445 | Disposition: A | Discharge status: Home Health | Payer: Medicare Other | Attending: Internal Medicine | Admitting: Internal Medicine

## 2018-09-18 ENCOUNTER — Encounter (HOSPITAL_COMMUNITY): Payer: Self-pay | Admitting: Radiology

## 2018-09-18 DIAGNOSIS — Z9221 Personal history of antineoplastic chemotherapy: Secondary | ICD-10-CM | POA: Diagnosis not present

## 2018-09-18 DIAGNOSIS — Z20828 Contact with and (suspected) exposure to other viral communicable diseases: Secondary | ICD-10-CM | POA: Diagnosis present

## 2018-09-18 DIAGNOSIS — Z9049 Acquired absence of other specified parts of digestive tract: Secondary | ICD-10-CM

## 2018-09-18 DIAGNOSIS — K219 Gastro-esophageal reflux disease without esophagitis: Secondary | ICD-10-CM | POA: Diagnosis present

## 2018-09-18 DIAGNOSIS — Z853 Personal history of malignant neoplasm of breast: Secondary | ICD-10-CM | POA: Diagnosis not present

## 2018-09-18 DIAGNOSIS — Z7951 Long term (current) use of inhaled steroids: Secondary | ICD-10-CM

## 2018-09-18 DIAGNOSIS — E119 Type 2 diabetes mellitus without complications: Secondary | ICD-10-CM | POA: Diagnosis not present

## 2018-09-18 DIAGNOSIS — K921 Melena: Secondary | ICD-10-CM | POA: Diagnosis present

## 2018-09-18 DIAGNOSIS — K8063 Calculus of gallbladder and bile duct with acute cholecystitis with obstruction: Secondary | ICD-10-CM | POA: Diagnosis not present

## 2018-09-18 DIAGNOSIS — E118 Type 2 diabetes mellitus with unspecified complications: Secondary | ICD-10-CM

## 2018-09-18 DIAGNOSIS — F419 Anxiety disorder, unspecified: Secondary | ICD-10-CM | POA: Diagnosis present

## 2018-09-18 DIAGNOSIS — Z8673 Personal history of transient ischemic attack (TIA), and cerebral infarction without residual deficits: Secondary | ICD-10-CM

## 2018-09-18 DIAGNOSIS — N183 Chronic kidney disease, stage 3 unspecified: Secondary | ICD-10-CM

## 2018-09-18 DIAGNOSIS — E785 Hyperlipidemia, unspecified: Secondary | ICD-10-CM | POA: Diagnosis present

## 2018-09-18 DIAGNOSIS — R1011 Right upper quadrant pain: Secondary | ICD-10-CM | POA: Diagnosis not present

## 2018-09-18 DIAGNOSIS — J45909 Unspecified asthma, uncomplicated: Secondary | ICD-10-CM | POA: Diagnosis present

## 2018-09-18 DIAGNOSIS — N184 Chronic kidney disease, stage 4 (severe): Secondary | ICD-10-CM | POA: Diagnosis present

## 2018-09-18 DIAGNOSIS — K5900 Constipation, unspecified: Secondary | ICD-10-CM | POA: Diagnosis present

## 2018-09-18 DIAGNOSIS — Z7982 Long term (current) use of aspirin: Secondary | ICD-10-CM | POA: Diagnosis not present

## 2018-09-18 DIAGNOSIS — E1122 Type 2 diabetes mellitus with diabetic chronic kidney disease: Secondary | ICD-10-CM

## 2018-09-18 DIAGNOSIS — K8062 Calculus of gallbladder and bile duct with acute cholecystitis without obstruction: Principal | ICD-10-CM | POA: Diagnosis present

## 2018-09-18 DIAGNOSIS — Z79811 Long term (current) use of aromatase inhibitors: Secondary | ICD-10-CM | POA: Diagnosis not present

## 2018-09-18 DIAGNOSIS — I129 Hypertensive chronic kidney disease with stage 1 through stage 4 chronic kidney disease, or unspecified chronic kidney disease: Secondary | ICD-10-CM | POA: Diagnosis present

## 2018-09-18 DIAGNOSIS — Z794 Long term (current) use of insulin: Secondary | ICD-10-CM

## 2018-09-18 DIAGNOSIS — Z9011 Acquired absence of right breast and nipple: Secondary | ICD-10-CM

## 2018-09-18 DIAGNOSIS — Z87891 Personal history of nicotine dependence: Secondary | ICD-10-CM | POA: Diagnosis not present

## 2018-09-18 DIAGNOSIS — R748 Abnormal levels of other serum enzymes: Secondary | ICD-10-CM | POA: Diagnosis present

## 2018-09-18 DIAGNOSIS — K819 Cholecystitis, unspecified: Secondary | ICD-10-CM

## 2018-09-18 DIAGNOSIS — K810 Acute cholecystitis: Secondary | ICD-10-CM

## 2018-09-18 DIAGNOSIS — Z79899 Other long term (current) drug therapy: Secondary | ICD-10-CM | POA: Diagnosis not present

## 2018-09-18 DIAGNOSIS — K760 Fatty (change of) liver, not elsewhere classified: Secondary | ICD-10-CM | POA: Diagnosis present

## 2018-09-18 DIAGNOSIS — D539 Nutritional anemia, unspecified: Secondary | ICD-10-CM

## 2018-09-18 DIAGNOSIS — Z955 Presence of coronary angioplasty implant and graft: Secondary | ICD-10-CM | POA: Diagnosis not present

## 2018-09-18 DIAGNOSIS — K8000 Calculus of gallbladder with acute cholecystitis without obstruction: Secondary | ICD-10-CM | POA: Diagnosis not present

## 2018-09-18 DIAGNOSIS — N1832 Chronic kidney disease, stage 3b: Secondary | ICD-10-CM

## 2018-09-18 DIAGNOSIS — I2510 Atherosclerotic heart disease of native coronary artery without angina pectoris: Secondary | ICD-10-CM | POA: Diagnosis present

## 2018-09-18 DIAGNOSIS — I252 Old myocardial infarction: Secondary | ICD-10-CM | POA: Diagnosis not present

## 2018-09-18 DIAGNOSIS — M109 Gout, unspecified: Secondary | ICD-10-CM | POA: Diagnosis present

## 2018-09-18 LAB — HEMOGLOBIN AND HEMATOCRIT, BLOOD
HCT: 34.5 % — ABNORMAL LOW (ref 36.0–46.0)
Hemoglobin: 10.5 g/dL — ABNORMAL LOW (ref 12.0–15.0)

## 2018-09-18 LAB — CBC WITH DIFFERENTIAL/PLATELET
Abs Immature Granulocytes: 0.06 10*3/uL (ref 0.00–0.07)
Basophils Absolute: 0 10*3/uL (ref 0.0–0.1)
Basophils Relative: 0 %
Eosinophils Absolute: 0 10*3/uL (ref 0.0–0.5)
Eosinophils Relative: 0 %
HCT: 35.1 % — ABNORMAL LOW (ref 36.0–46.0)
Hemoglobin: 11 g/dL — ABNORMAL LOW (ref 12.0–15.0)
Immature Granulocytes: 1 %
Lymphocytes Relative: 17 %
Lymphs Abs: 1.2 10*3/uL (ref 0.7–4.0)
MCH: 31.1 pg (ref 26.0–34.0)
MCHC: 31.3 g/dL (ref 30.0–36.0)
MCV: 99.2 fL (ref 80.0–100.0)
Monocytes Absolute: 0.6 10*3/uL (ref 0.1–1.0)
Monocytes Relative: 9 %
Neutro Abs: 5.4 10*3/uL (ref 1.7–7.7)
Neutrophils Relative %: 73 %
Platelets: 380 10*3/uL (ref 150–400)
RBC: 3.54 MIL/uL — ABNORMAL LOW (ref 3.87–5.11)
RDW: 14.6 % (ref 11.5–15.5)
WBC: 7.3 10*3/uL (ref 4.0–10.5)
nRBC: 0 % (ref 0.0–0.2)

## 2018-09-18 LAB — TYPE AND SCREEN
ABO/RH(D): AB POS
Antibody Screen: NEGATIVE

## 2018-09-18 LAB — COMPREHENSIVE METABOLIC PANEL
ALT: 325 U/L — ABNORMAL HIGH (ref 0–44)
AST: 215 U/L — ABNORMAL HIGH (ref 15–41)
Albumin: 4 g/dL (ref 3.5–5.0)
Alkaline Phosphatase: 645 U/L — ABNORMAL HIGH (ref 38–126)
Anion gap: 12 (ref 5–15)
BUN: 25 mg/dL — ABNORMAL HIGH (ref 8–23)
CO2: 24 mmol/L (ref 22–32)
Calcium: 10.5 mg/dL — ABNORMAL HIGH (ref 8.9–10.3)
Chloride: 100 mmol/L (ref 98–111)
Creatinine, Ser: 1.8 mg/dL — ABNORMAL HIGH (ref 0.44–1.00)
GFR calc Af Amer: 32 mL/min — ABNORMAL LOW (ref >60)
GFR calc non Af Amer: 28 mL/min — ABNORMAL LOW (ref >60)
Glucose, Bld: 179 mg/dL — ABNORMAL HIGH (ref 70–99)
Potassium: 4.6 mmol/L (ref 3.5–5.1)
Sodium: 136 mmol/L (ref 135–145)
Total Bilirubin: 5.5 mg/dL — ABNORMAL HIGH (ref 0.3–1.2)
Total Protein: 9.8 g/dL — ABNORMAL HIGH (ref 6.5–8.1)

## 2018-09-18 LAB — POC OCCULT BLOOD, ED: Fecal Occult Bld: POSITIVE — AB

## 2018-09-18 LAB — ABO/RH: ABO/RH(D): AB POS

## 2018-09-18 LAB — GLUCOSE, CAPILLARY: Glucose-Capillary: 112 mg/dL — ABNORMAL HIGH (ref 70–99)

## 2018-09-18 LAB — LIPASE, BLOOD: Lipase: 44 U/L (ref 11–51)

## 2018-09-18 LAB — SARS CORONAVIRUS 2 BY RT PCR (HOSPITAL ORDER, PERFORMED IN ~~LOC~~ HOSPITAL LAB): SARS Coronavirus 2: NEGATIVE

## 2018-09-18 MED ORDER — ATORVASTATIN CALCIUM 10 MG PO TABS: 20.0000 mg | ORAL_TABLET | Freq: Every day | ORAL | Status: DC

## 2018-09-18 MED ORDER — INSULIN GLARGINE 100 UNIT/ML ~~LOC~~ SOLN
5.0000 [IU] | Freq: Every day | SUBCUTANEOUS | Status: DC
  Filled 2018-09-18: qty 0.05

## 2018-09-18 MED ORDER — LORAZEPAM 2 MG/ML IJ SOLN
1.0000 mg | Freq: Once | INTRAMUSCULAR | Status: AC
  Administered 2018-09-18: 20:00:00 1 mg via INTRAVENOUS
  Filled 2018-09-18: qty 1

## 2018-09-18 MED ORDER — SODIUM CHLORIDE 0.9 % IV BOLUS
500.0000 mL | Freq: Once | INTRAVENOUS | Status: AC
  Administered 2018-09-18: 12:00:00 500 mL via INTRAVENOUS

## 2018-09-18 MED ORDER — OXYCODONE HCL 5 MG PO TABS
5.0000 mg | ORAL_TABLET | ORAL | Status: DC | PRN
  Administered 2018-09-20 – 2018-09-24 (×6): 5 mg via ORAL
  Filled 2018-09-18 (×6): qty 1

## 2018-09-18 MED ORDER — AMLODIPINE BESYLATE 5 MG PO TABS: 5.0000 mg | ORAL_TABLET | Freq: Every day | ORAL | Status: DC

## 2018-09-18 MED ORDER — SODIUM CHLORIDE 0.9 % IV SOLN
2.0000 g | Freq: Once | INTRAVENOUS | Status: AC
  Administered 2018-09-18: 16:00:00 2 g via INTRAVENOUS
  Filled 2018-09-18: qty 20

## 2018-09-18 MED ORDER — ALPRAZOLAM 0.25 MG PO TABS: 0.2500 mg | ORAL_TABLET | Freq: Every day | ORAL | Status: DC

## 2018-09-18 MED ORDER — IOHEXOL 300 MG/ML  SOLN
75.0000 mL | Freq: Once | INTRAMUSCULAR | Status: AC | PRN
  Administered 2018-09-18: 14:00:00 75 mL via INTRAVENOUS

## 2018-09-18 MED ORDER — PANTOPRAZOLE SODIUM 40 MG PO TBEC: 40.0000 mg | DELAYED_RELEASE_TABLET | Freq: Every day | ORAL | Status: DC

## 2018-09-18 MED ORDER — METOPROLOL SUCCINATE ER 50 MG PO TB24
50.0000 mg | ORAL_TABLET | Freq: Every day | ORAL | Status: DC
  Administered 2018-09-18: 23:00:00 50 mg via ORAL
  Filled 2018-09-18: qty 1

## 2018-09-18 MED ORDER — ASPIRIN EC 81 MG PO TBEC: 81.0000 mg | DELAYED_RELEASE_TABLET | Freq: Every day | ORAL | Status: DC

## 2018-09-18 MED ORDER — INSULIN ASPART 100 UNIT/ML ~~LOC~~ SOLN
0.0000 [IU] | Freq: Three times a day (TID) | SUBCUTANEOUS | Status: DC
  Administered 2018-09-19: 08:00:00 1 [IU] via SUBCUTANEOUS
  Administered 2018-09-20: 13:00:00 3 [IU] via SUBCUTANEOUS
  Administered 2018-09-21 – 2018-09-22 (×4): 1 [IU] via SUBCUTANEOUS
  Administered 2018-09-23: 12:00:00 5 [IU] via SUBCUTANEOUS
  Administered 2018-09-23: 08:00:00 1 [IU] via SUBCUTANEOUS
  Administered 2018-09-24: 08:00:00 3 [IU] via SUBCUTANEOUS
  Administered 2018-09-24: 16:00:00 1 [IU] via SUBCUTANEOUS
  Administered 2018-09-24: 2 [IU] via SUBCUTANEOUS
  Administered 2018-09-25: 09:00:00 1 [IU] via SUBCUTANEOUS
  Administered 2018-09-25: 13:00:00 3 [IU] via SUBCUTANEOUS

## 2018-09-18 MED ORDER — PANTOPRAZOLE SODIUM 40 MG IV SOLR
40.0000 mg | Freq: Two times a day (BID) | INTRAVENOUS | Status: DC
  Administered 2018-09-18 – 2018-09-25 (×14): 40 mg via INTRAVENOUS
  Filled 2018-09-18 (×14): qty 40

## 2018-09-18 MED ORDER — SODIUM CHLORIDE 0.9 % IV SOLN
2.0000 g | INTRAVENOUS | Status: DC
  Administered 2018-09-19: 16:00:00 via INTRAVENOUS
  Administered 2018-09-20 – 2018-09-24 (×5): 2 g via INTRAVENOUS
  Filled 2018-09-18 (×2): qty 2
  Filled 2018-09-18 (×4): qty 20
  Filled 2018-09-18: qty 2

## 2018-09-18 MED ORDER — MOMETASONE FURO-FORMOTEROL FUM 200-5 MCG/ACT IN AERO
2.0000 | INHALATION_SPRAY | Freq: Two times a day (BID) | RESPIRATORY_TRACT | Status: DC
  Administered 2018-09-19 – 2018-09-25 (×13): 2 via RESPIRATORY_TRACT
  Filled 2018-09-18: qty 8.8

## 2018-09-18 MED ORDER — HYDROMORPHONE HCL 1 MG/ML IJ SOLN
0.5000 mg | Freq: Once | INTRAMUSCULAR | Status: AC
  Administered 2018-09-18: 13:00:00 0.5 mg via INTRAVENOUS
  Filled 2018-09-18: qty 1

## 2018-09-18 MED ORDER — INSULIN ASPART 100 UNIT/ML ~~LOC~~ SOLN: 0.0000 [IU] | Freq: Every day | SUBCUTANEOUS | Status: DC

## 2018-09-18 MED ORDER — POLYETHYLENE GLYCOL 3350 17 G PO PACK: 17.0000 g | PACK | Freq: Every day | ORAL | Status: DC

## 2018-09-18 MED ORDER — SODIUM CHLORIDE (PF) 0.9 % IJ SOLN
INTRAMUSCULAR | Status: AC
  Filled 2018-09-18: qty 50

## 2018-09-18 MED ORDER — ALPRAZOLAM 0.25 MG PO TABS: 0.2500 mg | ORAL_TABLET | Freq: Two times a day (BID) | ORAL | Status: DC

## 2018-09-18 MED ORDER — METRONIDAZOLE IN NACL 5-0.79 MG/ML-% IV SOLN
500.0000 mg | Freq: Three times a day (TID) | INTRAVENOUS | Status: DC
  Administered 2018-09-18 – 2018-09-25 (×21): 500 mg via INTRAVENOUS
  Filled 2018-09-18 (×21): qty 100

## 2018-09-18 NOTE — Assessment & Plan Note (Deleted)
11/27/2017 right mastectomy: IDC grade 2, 1.6 cm, separate foci of DCIS intermediate grade, margins negative, 0/5 lymph nodes negative,ER 100%, PR 50%, Ki-67 15%, HER-2 positive ratio 2, copy #5 T1c N0 stage Ia  Treatment plan: 1. Adjuvant Herceptin every 3 weeks for 1 year (because of poor performance status Taxol is felt to be intolerable for her) 2. Adjuvant antiestrogen therapy with letrozole 2.5 mg daily  Patient's daughter is the primary point of contact for her and all appointment changes will need to go through her. -------------------------------------------------------------------------------------- Current treatment:Herceptin with anastrozole started 02/07/2018 Echo 02/17/2018 EF 55-60%--followed by Bensimhon  Tolerating anastrozole and Herceptin well. Will continue this. Hospitalization 07/14/2018: Chest pain  Severe anemia: GivenPRBCspreviously.Today's hemoglobin is11.4. Itching sensation on the right chest wall:moisturizing cream.  There is also slight swelling over that area.   Return to clinic in 3 weeks for Herceptin every 6 weeks for follow-up with me

## 2018-09-18 NOTE — Progress Notes (Signed)
Spoke with RN at 1750 about getting pt over for scan. Rn unable to bring pt at this time informed Rn that pt will be pushed til a later time.

## 2018-09-18 NOTE — ED Notes (Signed)
GI at bedside

## 2018-09-18 NOTE — Consult Note (Signed)
[name] 09-12-1950  [id number].    Requesting MD: Dr. 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  She is currently taking oral chemotherapy.  She also has a history of CAD, MI, CVA, DM, and anemia who is somewhat of a poor historian who states she has been having some epigastric abdominal pain for maybe the last couple of months.  She is very unclear on duration.  She denies fevers, chills, or diarrhea.  She has been having constipation.  "A doctor" unclear who saw her yesterday apparently and gave her medicine to move her bowels.  She states she thinks there was some blood present.  She admits to nausea over the last several months as well as pain with eating.  She states "some doctor" gave her medicine for nausea as well.  She vomited only once and that was yesterday.  She denies any weight loss.  She apparently went to see her primary doctor today who called EMS during their visit and that's how she presents today for further work up of her abdominal pain.  Since being here she has been found to have a normal WBC and no fever; however, her LFTs are elevated with a TB of 5.5 with alkphos of 645 and AST/ALT 215/325.  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  We have been asked to see her for further evaluation and recommendations.  ROS: ROS: Please see HPI, otherwise difficult to obtain ROS as she is a poor historian.  Family History  Problem Relation Age of Onset  . Heart disease Mother   . Diabetes Mother   . Cancer Father        unsure what kind  . Diabetes Sister   . Colon cancer Neg Hx   . Breast cancer Neg Hx     Past Medical History:  Diagnosis Date  . Angina   . Arthritis    HANDS"  . Asthma    . Cancer ([place]) 09/04/2017   Right Breast  . Carpal tunnel syndrome, bilateral 11/26/2016  . Cholelithiasis   . Cocaine abuse ([place]) 07/2012   per E.R. drug screen  . Coronary artery disease   . Diabetes mellitus without mention of complication    type 2  . Dysphagia    esophageal dysmotility on 03/2011 esophagram   . Dyspnea    ongoing   . Fatty liver disease, nonalcoholic 0263   on Imaging.   Marland Kitchen GERD (gastroesophageal reflux disease)   . Headache   . History of colonic polyps 2009   adenomatous 2009, HP in 2009, 2011, 2013.   Marland Kitchen Hyperlipidemia   . Hypertension   . Iron deficiency anemia   . Myocardial infarction ([place])    years ago, 6 stents placed  . 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 ABDOMINAL ANGIOGRAM  02/20/2011   Procedure: ABDOMINAL ANGIOGRAM;  Surgeon: Clent Demark, MD;  Location: [facility] CATH LAB;  Service: Cardiovascular;;  . CARPAL TUNNEL RELEASE Right 07/04/2017   Procedure: RIGHT CARPAL TUNNEL RELEASE;  Surgeon: Leanora Cover, MD;  Location: [place];  Service: Orthopedics;  Laterality: Right;  . CARPAL TUNNEL RELEASE Left 09/12/2017   Procedure: LEFT  CARPAL TUNNEL RELEASE;  Surgeon: Leanora Cover, MD;  Location: [place];  Service: Orthopedics;  Laterality: Left;  . COLONOSCOPY  11/30/2011   Procedure: COLONOSCOPY;  Surgeon: Lafayette Dragon, MD;  Location: [facility] ENDOSCOPY;  Service: Endoscopy;  Laterality: N/A;  . CORONARY ANGIOPLASTY WITH STENT PLACEMENT    . ESOPHAGOGASTRODUODENOSCOPY  11/30/2011   Procedure: ESOPHAGOGASTRODUODENOSCOPY (EGD);  Surgeon: Lafayette Dragon, MD;  Location: [facility];  Service: Endoscopy;  Laterality: N/A;  . ESOPHAGOGASTRODUODENOSCOPY N/A 02/23/2015   Procedure: ESOPHAGOGASTRODUODENOSCOPY (EGD);  Surgeon: Gatha Mayer, MD;  Location: [facility] ENDOSCOPY;  Service: Endoscopy;  Laterality: N/A;  . LEFT HEART CATHETERIZATION WITH CORONARY ANGIOGRAM N/A  02/20/2011   Procedure: LEFT HEART CATHETERIZATION WITH CORONARY ANGIOGRAM;  Surgeon: Clent Demark, MD;  Location: [place] CATH LAB;  Service: Cardiovascular;  Laterality: N/A;  . MASTECTOMY Right 11/05/2017  . MASTECTOMY W/ SENTINEL NODE BIOPSY Right 11/27/2017  . MASTECTOMY W/ SENTINEL NODE BIOPSY Right 11/27/2017   Procedure: RIGHT TOTAL MASTECTOMY WITH SENTINEL LYMPH NODE BIOPSY;  Surgeon: Excell Seltzer, MD;  Location: [place];  Service: General;  Laterality: Right;  . PORT A CATH REVISION Left 05/07/2018   Procedure: PORT A CATH REVISION;  Surgeon: Excell Seltzer, MD;  Location: [facility] ORS;  Service: General;  Laterality: Left;  . PORTACATH PLACEMENT Left 11/27/2017   Procedure: INSERTION PORT-A-CATH;  Surgeon: Excell Seltzer, MD;  Location: [place];  Service: General;  Laterality: Left;  . RIGHT COLECTOMY  02/2007   for post polypectomy colonic perforation  . TUBAL LIGATION      Social History:  reports that she quit smoking about 8 years ago. Her smoking use included cigarettes. She has a 23.50 pack-year smoking history. She has never used smokeless tobacco. She reports previous drug use. Drug: Cocaine. She reports that she does not drink alcohol.  Allergies: No Known Allergies  (Not in a hospital admission)    Physical Exam: Blood pressure (!) 166/93, pulse 74, temperature 97.8 F (36.6 C), temperature source Oral, resp. rate (!) 24, height 4\' 11"  (1.499 m), weight 58.1 kg, SpO2 100 %. General: elderly somewhat debilitated appearing black female who is laying in bed in NAD HEENT: head is normocephalic, atraumatic.  Sclera are slightly icteric.  Some exophthalmos.  PERRL.  Ears and nose without any masses or lesions.  Mouth is pink and moist. Some stuttering with speech at times. Heart: regular, rate, and rhythm.  Normal s1,s2. No obvious murmurs, gallops, or rubs noted.  Palpable radial and pedal pulses bilaterally Lungs: CTAB, no wheezes, rhonchi, or rales noted.  Respiratory  effort nonlabored Abd: soft, tender in her epigastrium and her RUQ, but no guarding or rebounding, protuberant, +BS, no masses, hernias, or organomegaly.  Healed midline scar from prior ex lap MS: all 4 extremities are symmetrical with no cyanosis, clubbing, or edema. Skin: warm and dry with no masses, lesions, or rashes Psych: A&Ox3 but poor historian   Results for orders placed or performed during the hospital encounter of 09/18/18 (from the past 48 hour(s))  POC occult blood, ED Provider will collect     Status: Abnormal   Collection Time: 09/18/18 11:53 AM  Result Value Ref Range   Fecal Occult Bld POSITIVE (A) NEGATIVE  ABO/Rh     Status: None (Preliminary result)   Collection Time: 09/18/18 12:13 PM  Result Value Ref Range   ABO/RH(D)      AB POS Performed at [facility], [place] [address] [place], [place] [address]  CBC with Differential     Status: Abnormal   Collection Time: 09/18/18 12:16 PM  Result Value Ref Range   WBC 7.3 4.0 - 10.5 K/uL   RBC 3.54 (L) 3.87 - 5.11 MIL/uL   Hemoglobin 11.0 (L) 12.0 - 15.0 g/dL   HCT 35.1 (L) 36.0 - 46.0 %   MCV 99.2 80.0 - 100.0 fL   MCH 31.1 26.0 - 34.0 pg   MCHC 31.3 30.0 - 36.0 g/dL   RDW 14.6 11.5 - 15.5 %   Platelets 380 150 - 400 K/uL   nRBC 0.0 0.0 - 0.2 %   Neutrophils Relative % 73 %   Neutro Abs 5.4 1.7 - 7.7 K/uL   Lymphocytes Relative 17 %   Lymphs Abs 1.2 0.7 - 4.0 K/uL   Monocytes Relative 9 %   Monocytes Absolute 0.6 0.1 - 1.0 K/uL   Eosinophils Relative 0 %   Eosinophils Absolute 0.0 0.0 - 0.5 K/uL   Basophils Relative 0 %   Basophils Absolute 0.0 0.0 - 0.1 K/uL   Immature Granulocytes 1 %   Abs Immature Granulocytes 0.06 0.00 - 0.07 K/uL    Comment: Performed at [facility], [place] [address] [place], [place] [address]  Comprehensive metabolic panel     Status: Abnormal   Collection Time: 09/18/18 12:16 PM  Result Value Ref Range   Sodium 136 135 - 145 mmol/L    Potassium 4.6 3.5 - 5.1 mmol/L   Chloride 100 98 - 111 mmol/L   CO2 24 22 - 32 mmol/L   Glucose, Bld 179 (H) 70 - 99 mg/dL   BUN 25 (H) 8 - 23 mg/dL   Creatinine, Ser 1.80 (H) 0.44 - 1.00 mg/dL   Calcium 10.5 (H) 8.9 - 10.3 mg/dL   Total Protein 9.8 (H) 6.5 - 8.1 g/dL   Albumin 4.0 3.5 - 5.0 g/dL   AST 215 (H) 15 - 41 U/L   ALT 325 (H) 0 - 44 U/L   Alkaline Phosphatase 645 (H) 38 - 126 U/L   Total Bilirubin 5.5 (H) 0.3 - 1.2 mg/dL   GFR calc non Af Amer 28 (L) >60 mL/min   GFR calc Af Amer 32 (L) >60 mL/min   Anion gap 12 5 - 15    Comment: Performed at [facility], [place] [address], [place], [place] [address]  Lipase, blood     Status: None   Collection Time: 09/18/18 12:16 PM  Result Value Ref Range   Lipase 44 11 - 51 U/L    Comment: Performed at [facility], [place] [address] [place], [place] [address]  Type and screen [place]     Status: None   Collection Time: 09/18/18 12:16 PM  Result Value Ref Range   ABO/RH(D) AB POS    Antibody Screen NEG    Sample Expiration      09/21/2018,2359 Performed at [facility], [place] [address], [place], [place] [address]   SARS Coronavirus 2 [facility] order, Performed in [facility] hospital lab) Nasopharyngeal Nasopharyngeal Swab     Status: None   Collection Time: 09/18/18  1:22 PM   Specimen: Nasopharyngeal Swab  Result Value Ref Range   SARS Coronavirus 2 NEGATIVE NEGATIVE    Comment: (NOTE) If result is NEGATIVE SARS-CoV-2 target nucleic acids are NOT DETECTED. The SARS-CoV-2 RNA is generally detectable in upper and lower  respiratory specimens during the acute phase of infection. The lowest  concentration of SARS-CoV-2 viral copies this assay can detect is 250  copies / mL. A negative result does not preclude SARS-CoV-2 infection  and should not be used as the sole basis for treatment or other  patient management decisions.  A negative result may occur  with  improper specimen collection / handling, submission of specimen other  than nasopharyngeal swab, presence of viral mutation(s) within the  areas targeted by this assay, and inadequate number of viral copies  (<250 copies / mL). A negative result must be combined with clinical  observations, patient history, and epidemiological information. If result is POSITIVE SARS-CoV-2 target nucleic acids are DETECTED. The SARS-CoV-2 RNA is generally detectable in upper and lower  respiratory specimens dur ing the acute phase of infection.  Positive  results are indicative of active infection with SARS-CoV-2.  Clinical  correlation with patient history and other diagnostic information is  necessary to determine patient infection status.  Positive results do  not rule out bacterial infection or co-infection with other viruses. If result is PRESUMPTIVE POSTIVE SARS-CoV-2 nucleic acids MAY BE PRESENT.   A presumptive positive result was obtained on the submitted specimen  and confirmed on repeat testing.  While 2019 novel coronavirus  (SARS-CoV-2) nucleic acids may be present in the submitted sample  additional confirmatory testing may be necessary for epidemiological  and / or clinical management purposes  to differentiate between  SARS-CoV-2 and other Sarbecovirus currently known to infect humans.  If clinically indicated additional testing with an alternate test  methodology [phone]) is advised. The SARS-CoV-2 RNA is generally  detectable in upper and lower respiratory sp ecimens during the acute  phase of infection. The expected result is Negative. Fact Sheet for Patients:  StrictlyIdeas.no Fact Sheet for Healthcare Providers: BankingDealers.co.za This test is not yet approved or cleared by the [place] FDA and has been authorized for detection and/or diagnosis of SARS-CoV-2 by FDA under an Emergency Use Authorization (EUA).  This EUA  will remain in effect (meaning this test can be used) for the duration of the COVID-19 declaration under Section 564(b)(1) of the Act, 21 U.S.C. section 360bbb-3(b)(1), unless the authorization is terminated or revoked sooner. Performed at [facility], [place] [address], [place], [place] [address]    Ct Abdomen Pelvis W Contrast  Result Date: 09/18/2018 CLINICAL DATA:  Abdominal pain and distension. History of breast carcinoma EXAM: CT ABDOMEN AND PELVIS WITH CONTRAST TECHNIQUE: Multidetector CT imaging of the abdomen and pelvis was performed using the standard protocol following bolus administration of intravenous contrast. CONTRAST:  6mL OMNIPAQUE IOHEXOL 300 MG/ML  SOLN COMPARISON:  December 18, 2017 FINDINGS: Lower chest: There is bibasilar atelectasis. There is mild bronchiectatic change in the lung bases as well. There are foci of coronary artery calcification. Hepatobiliary: There is hepatic steatosis. No focal liver lesions are evident. There is cholelithiasis. The gallbladder wall is diffusely thickened and edematous. There is loss of fat plane between the gallbladder and the pylorus/first portion of the duodenum. There is no air within the gallbladder. There is no appreciable intrahepatic biliary duct dilatation. There is dilatation of the distal common bile duct, measuring 1.2 cm. No biliary duct mass or calculus evident. Pancreas: No pancreatic mass or inflammatory focus evident. No pancreatic duct dilatation. Spleen: No splenic lesions are evident. Adrenals/Urinary Tract: Adrenals bilaterally appear normal. There are subcentimeter cysts in the right kidney. There is an extrarenal pelvis on the right, an anatomic variant. There is no hydronephrosis on either side. There  is or ureteral calculus on either side. Urinary bladder wall thickness is within normal limits. Stomach/Bowel: As noted above, there is loss of fat plane between the gallbladder and the pylorus/first portion  of the duodenum. There is wall thickening in the distal stomach and proximal duodenum. There is no other appreciable bowel wall thickening. The patient has had a partial right colectomy with anastomosis patent in this area. Terminal ileum appears unremarkable. No evident bowel obstruction. There is no free air or portal venous air. Vascular/Lymphatic: There is aortic and iliac artery atherosclerosis. No aneurysm evident. Major mesenteric arterial vessels appear patent, although there is severe narrowing at the origin of the right renal artery and extensive calcification with severe narrowing in the proximal left renal artery. No adenopathy is evident in the abdomen or pelvis. Reproductive: There are calcifications in the uterus, felt to be indicative of leiomyomatous change. The uterus is slightly canted toward the right. No pelvic mass is demonstrable. Other: Appendix absent. There is no abscess or ascites evident in the abdomen or pelvis. There is fat in each inguinal ring. Musculoskeletal: There are no blastic or lytic bone lesions. There is bony hypertrophy at L4 and diffuse disc protrusion. These findings in concert leads spinal stenosis. Similar changes are noted at L3-4 with spinal stenosis also present at L3-4. No blastic or lytic bone lesions. No intramuscular lesions are evident. IMPRESSION: 1. There is cholelithiasis with diffuse gallbladder wall thickening and edema. There is loss of fat plane between the gallbladder and the distal stomach/proximal duodenum. 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 No air seen in the gallbladder. 2. There is dilatation of the common hepatic and common bile ducts. No intrahepatic biliary duct dilatation seen. No biliary duct mass or calculus evident. A mass at the ampulla could cause these findings. From an imaging standpoint, MRCP would be the optimum study of choice to further evaluate in this regard. 3.  Hepatic steatosis.  No focal liver lesions  appreciable. 4. No bowel obstruction. No abscess in the abdomen or pelvis. The patient is status post partial right colectomy with anastomosis patent. 5. Spinal stenosis at L3-4 and L4-5 due to disc protrusions and bony hypertrophy at these levels. 6. Extensive calcification and narrowing in the proximal renal arteries bilaterally. Question whether patient is hypertensive; there may well be renovascular hypertension given this appearance. 7.  Extensive aortic and iliac artery atherosclerosis. 8. Calcifications in the uterus consistent with leiomyomatous change. Electronically Signed   By: Lowella Grip III M.D.   On: 09/18/2018 14:26   Dg Abdomen Acute W/chest  Result Date: 09/18/2018 CLINICAL DATA:  Acute generalized abdominal pain. EXAM: DG ABDOMEN ACUTE W/ 1V CHEST COMPARISON:  Radiographs of April 10, 2018 and February 27, 2015. FINDINGS: There is no evidence of dilated bowel loops or free intraperitoneal air. No radiopaque calculi or other significant radiographic abnormality is seen. Stable cardiomediastinal silhouette is noted. Atherosclerosis of thoracic aorta is noted. Left internal jugular Port-A-Cath is noted in grossly good position. Both lungs are clear. IMPRESSION: No evidence of bowel obstruction or ileus. No acute cardiopulmonary disease. Aortic Atherosclerosis (ICD10-I70.0). Electronically Signed   By: Marijo Conception M.D.   On: 09/18/2018 13:24      Assessment/Plan CAD H/O MI H/O CVA HTN DM Right sided DCIS, on oral chemo CKD Hemoccult + - defer to GI  RUQ abdominal pain, elevated LFTs The patient has elevated LFTs along with some type of process in the RUQ causing abdominal pain.  This could be  as simple of cholecystitis vs gallbladder cancer vs choledochoduodenal fistula.  She will need an MRCP to get further information regarding this process.  She may have something at the ampulla, but her ducts are not dilated.  She may require an ERCP to further determine what is going  on.  Would recommend initiation of at least Rocephin, despite no fever or WBC, given appearance of RUQ on CT scan.  We will follow the patient and await further work up before giving further recommendations.  FEN - IVFs, may have at least clears from our standpoint VTE - defer to medicine ID - Rocephin  Henreitta Cea, [facility] 09/18/2018, 4:00 PM Pager: [phone]

## 2018-09-18 NOTE — Progress Notes (Signed)
Called RN at [facility] about Pt being claus. Informed Rn that pt will need meds before attempting MRI aging. Informed pt that RN is aware and will bring meds to MRI.

## 2018-09-18 NOTE — ED Notes (Addendum)
Documented in error.

## 2018-09-18 NOTE — ED Provider Notes (Signed)
[facility] DEPT Provider Note   CSN: [id number] Arrival date & time: 09/18/18  1112    History   Chief Complaint Chief Complaint  Patient presents with  . GI Problem    HPI [name] is a 68 y.o. female with history of right breast cancer status post mastectomy, cholelithiasis, diabetes mellitus, fatty liver disease, GERD, hyperlipidemia, hypertension, MI presenting for evaluation of acute onset, persistent and progressively worsening epigastric abdominal pain since yesterday with associated bloody stools.  She reports that yesterday she took lactulose and had a few watery stools with dark blood.  She has been having cramping epigastric abdominal pain which she reports has been a problem intermittently for the last year or so.  Does not radiate, no aggravating or alleviating factors noted.  Yesterday she had an episode of nonbloody nonbilious emesis.  She went to her family doctor today for evaluation who performed a Hemoccult which was positive and sent her to the ED for further evaluation.  She tells me she feels generally weak, no syncope.  Denies fever, cough, chest pain, shortness of breath, or urinary symptoms.     The history is provided by the patient.    Past Medical History:  Diagnosis Date  . Angina   . Arthritis    HANDS"  . Asthma   . Cancer ([place]) 09/04/2017   Right Breast  . Carpal tunnel syndrome, bilateral 11/26/2016  . Cholelithiasis   . Cocaine abuse ([place]) 07/2012   per E.R. drug screen  . Coronary artery disease   . Diabetes mellitus without mention of complication    type 2  . Dysphagia    esophageal dysmotility on 03/2011 esophagram   . Dyspnea    ongoing   . Fatty liver disease, nonalcoholic 9470   on Imaging.   Marland Kitchen GERD (gastroesophageal reflux disease)   . Headache   . History of colonic polyps 2009   adenomatous 2009, HP in 2009, 2011, 2013.   Marland Kitchen Hyperlipidemia   . Hypertension   . Iron deficiency anemia    . Myocardial infarction ([place])    years ago, 6 stents placed  . 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 Elevated liver enzymes 09/18/2018  . Unstable angina ([place]) 07/12/2018  . Port-A-Cath in place 02/27/2018  . Cancer of overlapping sites of right breast ([place]) 11/27/2017  . Malignant neoplasm of upper-outer quadrant of right breast in female, estrogen receptor positive ([place]) 10/03/2017  . Carpal tunnel syndrome, bilateral 11/26/2016  . 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 Abdominal pain, epigastric   . Acute on chronic renal failure ([place]) 02/19/2015  . Diarrhea   . Nausea with vomiting   . Hyperglycemia 04/01/2014  . Hyperosmolar non-ketotic state in patient with type 2 diabetes mellitus ([place]) 04/01/2014  . Nausea vomiting and diarrhea 04/01/2014  . CAD (coronary artery disease) 04/01/2014  . Dehydration   . Diabetic ketoacidosis without coma associated with diabetes mellitus due to underlying condition ([place])   . High anion gap metabolic acidosis   . Cocaine abuse ([place])   . Toxic encephalopathy-Unlikely metabolic,?psychogenic 08/02/2012  . Bereavement 02/02/2012  . Depression 02/02/2012  . Iron deficiency anemia, unspecified 11/30/2011  . Nonspecific abnormal finding in stool contents 11/30/2011  . Benign neoplasm of colon 11/30/2011  . Cough 05/14/2011  . Chest pain 03/19/2011  . Anemia 03/19/2011  . Diabetes mellitus ([place]) 03/19/2011  . DOE (dyspnea  on exertion) 03/19/2011  . Chest pain at rest 02/18/2011    Class: Acute  . Cholelithiasis 11/01/2010  . DM 04/08/2009  . HYPERLIPIDEMIA 04/08/2009  . Essential hypertension 04/08/2009  . MYOCARDIAL INFARCTION 04/08/2009  . Coronary atherosclerosis 04/08/2009  . GERD 04/08/2009  . FATTY LIVER DISEASE 04/08/2009  . COLONIC POLYPS, HX OF 04/08/2009    Past Surgical History:  Procedure Laterality Date  . ABDOMINAL ANGIOGRAM  02/20/2011   Procedure:  ABDOMINAL ANGIOGRAM;  Surgeon: Clent Demark, MD;  Location: [facility] CATH LAB;  Service: Cardiovascular;;  . CARPAL TUNNEL RELEASE Right 07/04/2017   Procedure: RIGHT CARPAL TUNNEL RELEASE;  Surgeon: Leanora Cover, MD;  Location: [facility];  Service: Orthopedics;  Laterality: Right;  . CARPAL TUNNEL RELEASE Left 09/12/2017   Procedure: LEFT CARPAL TUNNEL RELEASE;  Surgeon: Leanora Cover, MD;  Location: [facility];  Service: Orthopedics;  Laterality: Left;  . COLONOSCOPY  11/30/2011   Procedure: COLONOSCOPY;  Surgeon: Lafayette Dragon, MD;  Location: [facility] ENDOSCOPY;  Service: Endoscopy;  Laterality: N/A;  . CORONARY ANGIOPLASTY WITH STENT PLACEMENT    . ESOPHAGOGASTRODUODENOSCOPY  11/30/2011   Procedure: ESOPHAGOGASTRODUODENOSCOPY (EGD);  Surgeon: Lafayette Dragon, MD;  Location: [facility];  Service: Endoscopy;  Laterality: N/A;  . ESOPHAGOGASTRODUODENOSCOPY N/A 02/23/2015   Procedure: ESOPHAGOGASTRODUODENOSCOPY (EGD);  Surgeon: Gatha Mayer, MD;  Location: [facility] ENDOSCOPY;  Service: Endoscopy;  Laterality: N/A;  . LEFT HEART CATHETERIZATION WITH CORONARY ANGIOGRAM N/A 02/20/2011   Procedure: LEFT HEART CATHETERIZATION WITH CORONARY ANGIOGRAM;  Surgeon: Clent Demark, MD;  Location: [facility] CATH LAB;  Service: Cardiovascular;  Laterality: N/A;  . MASTECTOMY Right 11/05/2017  . MASTECTOMY W/ SENTINEL NODE BIOPSY Right 11/27/2017  . MASTECTOMY W/ SENTINEL NODE BIOPSY Right 11/27/2017   Procedure: RIGHT TOTAL MASTECTOMY WITH SENTINEL LYMPH NODE BIOPSY;  Surgeon: Excell Seltzer, MD;  Location: [facility];  Service: General;  Laterality: Right;  . PORT A CATH REVISION Left 05/07/2018   Procedure: PORT A CATH REVISION;  Surgeon: Excell Seltzer, MD;  Location: [facility] ORS;  Service: General;  Laterality: Left;  . PORTACATH PLACEMENT Left 11/27/2017   Procedure: INSERTION PORT-A-CATH;  Surgeon: Excell Seltzer, MD;  Location: [facility];  Service: General;  Laterality: Left;  . RIGHT COLECTOMY   02/2007   for post polypectomy colonic perforation  . TUBAL LIGATION       OB History   No obstetric history on file.      Home Medications    Prior to Admission medications   Medication Sig Start Date End Date Taking? Authorizing Provider  acetaminophen (TYLENOL) 325 MG tablet Take 2 tablets (650 mg total) by mouth every 4 (four) hours as needed for headache or mild pain. 07/14/18  Yes Charolette Forward, MD  albuterol (PROVENTIL) (2.5 MG/3ML) 0.083% nebulizer solution INHALE THE CONTENTS OF 1 VIAL USING NEBULIZER EVERY 6 HOURS AS NEEDEDFOR WHEEZING OR SHORTNESS OF BREATH Patient taking differently: Take 2.5 mg by nebulization every 6 (six) hours as needed for wheezing or shortness of breath.  08/07/18  Yes Nicholas Lose, MD  allopurinol (ZYLOPRIM) 100 MG tablet Take 100 mg by mouth daily. 09/17/18  Yes [provider]  ALPRAZolam (XANAX) 0.25 MG tablet Take 0.25 mg by mouth daily.    Yes [provider]  amLODipine (NORVASC) 5 MG tablet Take 5 mg by mouth daily.   Yes [provider]  anastrozole (ARIMIDEX) 1 MG tablet Take 1 tablet (1 mg total) by mouth daily. 02/27/18  Yes Gudena,  Loleta Dicker, MD  aspirin EC 81 MG EC tablet Take 1 tablet (81 mg total) by mouth daily. 02/06/12  Yes Patrecia Pour, NP  atorvastatin (LIPITOR) 20 MG tablet Take 20 mg by mouth daily.   Yes [provider]  budesonide-formoterol (SYMBICORT) 160-4.5 MCG/ACT inhaler Inhale 1 puff into the lungs 2 (two) times daily.    Yes [provider]  colchicine 0.6 MG tablet Take 0.6 mg by mouth daily as needed for pain. 07/17/18  Yes [provider]  feeding supplement, ENSURE ENLIVE, (ENSURE ENLIVE) LIQD Take 237 mLs by mouth 2 (two) times daily between meals. 02/24/15  Yes Ghimire, Henreitta Leber, MD  lactulose (CHRONULAC) 10 GM/15ML solution Take 30 mLs by mouth 3 (three) times daily. 09/17/18  Yes [provider]  LANTUS SOLOSTAR 100 UNIT/ML Solostar Pen Inject 10 Units into the  skin daily. 07/24/18  Yes [provider]  loperamide (IMODIUM) 2 MG capsule Take 1 capsule (2 mg total) by mouth 4 (four) times daily as needed for diarrhea or loose stools. 02/27/15  Yes Marella Chimes, PA-C  metoprolol succinate (TOPROL-XL) 50 MG 24 hr tablet Take 50 mg by mouth at bedtime. Take with or immediately following a meal.   Yes [provider]  Omega-3 Fatty Acids (FISH OIL) 1000 MG CAPS Take 1,000 mg by mouth daily.   Yes [provider]  ondansetron (ZOFRAN) 8 MG tablet Take 1 tablet (8 mg total) by mouth every 8 (eight) hours as needed for nausea or vomiting. 06/16/18  Yes Nicholas Lose, MD  pantoprazole (PROTONIX) 40 MG tablet Take 1 tablet (40 mg total) by mouth daily. 02/24/15  Yes Ghimire, Henreitta Leber, MD  traMADol (ULTRAM) 50 MG tablet Take 50-100 mg by mouth every 6 (six) hours as needed for pain. 09/17/18  Yes [provider]  nitroGLYCERIN (NITROSTAT) 0.4 MG SL tablet Place 1 tablet (0.4 mg total) under the tongue every 5 (five) minutes as needed. For chest pain Patient taking differently: Place 0.4 mg under the tongue every 5 (five) minutes as needed for chest pain.  02/06/12   Patrecia Pour, NP    Family History Family History  Problem Relation Age of Onset  . Heart disease Mother   . Diabetes Mother   . Cancer Father        unsure what kind  . Diabetes Sister   . Colon cancer Neg Hx   . Breast cancer Neg Hx     Social History Social History   Tobacco Use  . Smoking status: Former Smoker    Packs/day: 0.50    Years: 47.00    Pack years: 23.50    Types: Cigarettes    Quit date: 05/09/2010    Years since quitting: 8.3  . Smokeless tobacco: Never Used  Substance Use Topics  . Alcohol use: No  . Drug use: Not Currently    Types: Cocaine    Comment: positive drug screen (cocaine, benzo's)07/2012 in emergency room     Allergies   Patient has no known allergies.   Review of Systems Review of Systems  Constitutional:  Positive for fatigue. Negative for chills and fever.  Respiratory: Negative for cough and shortness of breath.   Cardiovascular: Negative for chest pain.  Gastrointestinal: Positive for abdominal pain, blood in stool, diarrhea, nausea and vomiting.  Genitourinary: Negative for dysuria, frequency, hematuria and urgency.  All other systems reviewed and are negative.    Physical Exam Updated Vital Signs BP (!) 171/80  Pulse 68   Temp 97.8 F (36.6 C) (Oral)   Resp 13   Ht _0  (1.499 m)   Wt 58.1 kg   SpO2 95%   BMI 25.85 kg/m   Physical Exam Vitals signs and nursing note reviewed.  Constitutional:      General: She is not in acute distress.    Appearance: She is well-developed.     Comments: Hard of hearing  HENT:     Head: Normocephalic and atraumatic.  Eyes:     General:        Right eye: No discharge.        Left eye: No discharge.     Conjunctiva/sclera: Conjunctivae normal.  Neck:     Vascular: No JVD.     Trachea: No tracheal deviation.  Cardiovascular:     Rate and Rhythm: Normal rate and regular rhythm.     Comments: Port-a-Cath to the left chest with no surrounding erythema or induration. Pulmonary:     Effort: Pulmonary effort is normal.     Comments: Bibasilar crackles.  Speaks in full sentences without difficulty. Abdominal:     General: Abdomen is protuberant. A surgical scar is present. Bowel sounds are increased. There is no distension.     Palpations: Abdomen is soft.     Tenderness: There is generalized abdominal tenderness and tenderness in the epigastric area. There is guarding. There is no right CVA tenderness, left CVA tenderness or rebound. Negative signs include Murphy's sign.     Comments: Large midline surgical scar.  Generalized tenderness, maximally tender in the epigastric region.  Genitourinary:    Comments: Examination performed in the presence of a chaperone.  No frank rectal bleeding.  Small external hemorrhoids which are not  thrombosed or actively bleeding.  Nontender.  Scant amount of stool in the rectal vault but there is a small blood clot on my glove after DRE. Skin:    General: Skin is warm and dry.     Findings: No erythema.  Neurological:     Mental Status: She is alert.  Psychiatric:        Behavior: Behavior normal.      ED Treatments / Results  Labs (all labs ordered are listed, but only abnormal results are displayed) Labs Reviewed  CBC WITH DIFFERENTIAL/PLATELET - Abnormal; Notable for the following components:      Result Value   RBC 3.54 (*)    Hemoglobin 11.0 (*)    HCT 35.1 (*)    All other components within normal limits  COMPREHENSIVE METABOLIC PANEL - Abnormal; Notable for the following components:   Glucose, Bld 179 (*)    BUN 25 (*)    Creatinine, Ser 1.80 (*)    Calcium 10.5 (*)    Total Protein 9.8 (*)    AST 215 (*)    ALT 325 (*)    Alkaline Phosphatase 645 (*)    Total Bilirubin 5.5 (*)    GFR calc non Af Amer 28 (*)    GFR calc Af Amer 32 (*)    All other components within normal limits  POC OCCULT BLOOD, ED - Abnormal; Notable for the following components:   Fecal Occult Bld POSITIVE (*)    All other components within normal limits  SARS CORONAVIRUS 2 (HOSPITAL ORDER, [facility])  LIPASE, BLOOD  URINALYSIS, ROUTINE W REFLEX MICROSCOPIC  TYPE AND SCREEN  ABO/RH    EKG None  Radiology Ct Abdomen Pelvis W Contrast  Result Date: 09/18/2018 CLINICAL DATA:  Abdominal pain and distension. History of breast carcinoma EXAM: CT ABDOMEN AND PELVIS WITH CONTRAST TECHNIQUE: Multidetector CT imaging of the abdomen and pelvis was performed using the standard protocol following bolus administration of intravenous contrast. CONTRAST:  58m OMNIPAQUE IOHEXOL 300 MG/ML  SOLN COMPARISON:  December 18, 2017 FINDINGS: Lower chest: There is bibasilar atelectasis. There is mild bronchiectatic change in the lung bases as well. There are foci of coronary  artery calcification. Hepatobiliary: There is hepatic steatosis. No focal liver lesions are evident. There is cholelithiasis. The gallbladder wall is diffusely thickened and edematous. There is loss of fat plane between the gallbladder and the pylorus/first portion of the duodenum. There is no air within the gallbladder. There is no appreciable intrahepatic biliary duct dilatation. There is dilatation of the distal common bile duct, measuring 1.2 cm. No biliary duct mass or calculus evident. Pancreas: No pancreatic mass or inflammatory focus evident. No pancreatic duct dilatation. Spleen: No splenic lesions are evident. Adrenals/Urinary Tract: Adrenals bilaterally appear normal. There are subcentimeter cysts in the right kidney. There is an extrarenal pelvis on the right, an anatomic variant. There is no hydronephrosis on either side. There is or ureteral calculus on either side. Urinary bladder wall thickness is within normal limits. Stomach/Bowel: As noted above, there is loss of fat plane between the gallbladder and the pylorus/first portion of the duodenum. There is wall thickening in the distal stomach and proximal duodenum. There is no other appreciable bowel wall thickening. The patient has had a partial right colectomy with anastomosis patent in this area. Terminal ileum appears unremarkable. No evident bowel obstruction. There is no free air or portal venous air. Vascular/Lymphatic: There is aortic and iliac artery atherosclerosis. No aneurysm evident. Major mesenteric arterial vessels appear patent, although there is severe narrowing at the origin of the right renal artery and extensive calcification with severe narrowing in the proximal left renal artery. No adenopathy is evident in the abdomen or pelvis. Reproductive: There are calcifications in the uterus, felt to be indicative of leiomyomatous change. The uterus is slightly canted toward the right. No pelvic mass is demonstrable. Other: Appendix  absent. There is no abscess or ascites evident in the abdomen or pelvis. There is fat in each inguinal ring. Musculoskeletal: There are no blastic or lytic bone lesions. There is bony hypertrophy at L4 and diffuse disc protrusion. These findings in concert leads spinal stenosis. Similar changes are noted at L3-4 with spinal stenosis also present at L3-4. No blastic or lytic bone lesions. No intramuscular lesions are evident. IMPRESSION: 1. There is cholelithiasis with diffuse gallbladder wall thickening and edema. There is loss of fat plane between the gallbladder and the distal stomach/proximal duodenum. 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 No air seen in the gallbladder. 2. There is dilatation of the common hepatic and common bile ducts. No intrahepatic biliary duct dilatation seen. No biliary duct mass or calculus evident. A mass at the ampulla could cause these findings. From an imaging standpoint, MRCP would be the optimum study of choice to further evaluate in this regard. 3.  Hepatic steatosis.  No focal liver lesions appreciable. 4. No bowel obstruction. No abscess in the abdomen or pelvis. The patient is status post partial right colectomy with anastomosis patent. 5. Spinal stenosis at L3-4 and L4-5 due to disc protrusions and bony hypertrophy at these levels. 6. Extensive calcification and narrowing in the proximal renal arteries bilaterally. Question whether patient is hypertensive; there may well  be renovascular hypertension given this appearance. 7.  Extensive aortic and iliac artery atherosclerosis. 8. Calcifications in the uterus consistent with leiomyomatous change. Electronically Signed   By: Lowella Grip III M.D.   On: 09/18/2018 14:26   Dg Abdomen Acute W/chest  Result Date: 09/18/2018 CLINICAL DATA:  Acute generalized abdominal pain. EXAM: DG ABDOMEN ACUTE W/ 1V CHEST COMPARISON:  Radiographs of April 10, 2018 and February 27, 2015. FINDINGS: There is no evidence  of dilated bowel loops or free intraperitoneal air. No radiopaque calculi or other significant radiographic abnormality is seen. Stable cardiomediastinal silhouette is noted. Atherosclerosis of thoracic aorta is noted. Left internal jugular Port-A-Cath is noted in grossly good position. Both lungs are clear. IMPRESSION: No evidence of bowel obstruction or ileus. No acute cardiopulmonary disease. Aortic Atherosclerosis (ICD10-I70.0). Electronically Signed   By: Marijo Conception M.D.   On: 09/18/2018 13:24    Procedures Procedures (including critical care time)  Medications Ordered in ED Medications  sodium chloride (PF) 0.9 % injection (has no administration in time range)  metroNIDAZOLE (FLAGYL) IVPB 500 mg (500 mg Intravenous New Bag/Given 09/18/18 1707)  cefTRIAXone (ROCEPHIN) 2 g in sodium chloride 0.9 % 100 mL IVPB (has no administration in time range)  HYDROmorphone (DILAUDID) injection 0.5 mg (0.5 mg Intravenous Given 09/18/18 1232)  sodium chloride 0.9 % bolus 500 mL (500 mLs Intravenous New Bag/Given 09/18/18 1217)  iohexol (OMNIPAQUE) 300 MG/ML solution 75 mL (75 mLs Intravenous Contrast Given 09/18/18 1352)  cefTRIAXone (ROCEPHIN) 2 g in sodium chloride 0.9 % 100 mL IVPB (2 g Intravenous New Bag/Given 09/18/18 1616)     Initial Impression / Assessment and Plan / ED Course  I have reviewed the triage vital signs and the nursing notes.  Pertinent labs & imaging results that were available during my care of the patient were reviewed by me and considered in my medical decision making (see chart for details).        Patient presents sent from her PCP for evaluation of heme positive stools with upper abdominal pain.  She is afebrile, hypertensive in the ED.  Uncomfortable but nontoxic in appearance.  She has some guarding on examination of the abdomen, but acute abdomen with chest radiographs show no evidence of obstruction or perforation.  Lab work reviewed by me is significant for  acutely elevated LFTs with elevated alk phos and bilirubin as well.  She is also mildly anemic but hemodynamically stable with no significant drop compared to prior lab studies.  Her point-of-care Hemoccult is positive but she has no frank rectal bleeding on examination, just a small amount of blood clots on digital rectal exam.  CT scan of the abdomen and pelvis shows cholelithiasis with diffuse gallbladder wall thickening and edema.  There is also some concern for cholecysto enteric fistula.  There is dilatation of the common hepatic and common bile ducts with no intrahepatic biliary duct dilatation seen.  There is concerned that there could be a mass at the ampulla causing this and radiology recommends an MRCP to further evaluate.  3:03 PM CONSULT: Spoke with Claiborne Billings, general surgery PA.  She recommends hospitalist admission, GI consultation and general surgery will follow the patient while she is admitted as well.  She also recommends initiation of Rocephin despite the absence of fever or leukocytosis given current appearance of the gallbladder.    3:30PM CONSULT: Spoke with Janett Billow with [facility]; they will follow the patient while she is admitted.  Plan for likely MRCP.  No  emergent intervention indicated for her heme positive stools that she remains hemodynamically stable.  Spoke with Dr. Florene Glen with [facility] hospitalist service who agrees to assume care of patient and bring her into the hospital for further evaluation and management.  Final Clinical Impressions(s) / ED Diagnoses   Final diagnoses:  Calculus of gallbladder and bile duct with acute cholecystitis, with obstruction  Melena    ED Discharge Orders    None       Renita Papa, PA-C 09/18/18 1823    Pattricia Boss, MD 09/19/18 1554

## 2018-09-18 NOTE — ED Triage Notes (Signed)
Pt to ED via EMS from doctors office, pt c/o abdominal pain and distention over the past week. Hemmocult at doctors office positive. Status post right mastectomy, pt is currently being followed at cancer center.

## 2018-09-18 NOTE — H&P (Signed)
History and Physical    [name] GYF:[id number] DOB: March 27, 1950 DOA: 09/18/2018  PCP: Tsosie Billing, MD (Inactive) (Confirm with patient/family/NH records and if not entered, this has to be entered at [facility] point of entry) Patient coming from: home  I have personally briefly reviewed patient's old medical records in [place]  Chief Complaint: abdominal pain, blood in stool  HPI: [name] is [name] 68 y.o. female with medical history significant of right colectomy, breast cancer, T2DM, GERD, asthma, CAD, and multiple other medical problems presenting with abdominal pain.    She notes that she came to hospital today due to abdominal pain.  Describes as "pain in stomach".  Comes and goes.  Present for "months".  She denies fevers, chills, CP, SOB.  Notes 1 episode of emesis last night.  She noted loose stool which her doctor noted had blood in it and she was directed to the ED.  ED Course: Labs, imaging.  GI and surgery c/s.  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    Review of Systems: As per HPI otherwise 10 point review of systems negative.   Past Medical History:  Diagnosis Date  . Angina   . Arthritis    HANDS"  . Asthma   . Cancer ([place]) 09/04/2017   Right Breast  . Carpal tunnel syndrome, bilateral 11/26/2016  . Cholelithiasis   . Cocaine abuse ([place]) 07/2012   per E.R. drug screen  . Coronary artery disease   . Diabetes mellitus without mention of complication    type 2  . Dysphagia    esophageal dysmotility on 03/2011 esophagram   . Dyspnea    ongoing   . Fatty liver disease, nonalcoholic 9163   on Imaging.   Marland Kitchen GERD (gastroesophageal reflux disease)   . Headache   . History of colonic polyps 2009   adenomatous 2009, HP in 2009, 2011, 2013.   Marland Kitchen Hyperlipidemia   . Hypertension   . Iron deficiency anemia   . Myocardial infarction ([place])    years ago, 6 stents placed  . Ulnar neuropathy at elbow,  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    Past Surgical History:  Procedure Laterality Date  . ABDOMINAL ANGIOGRAM  02/20/2011   Procedure: ABDOMINAL ANGIOGRAM;  Surgeon: Clent Demark, MD;  Location: [facility] CATH LAB;  Service: Cardiovascular;;  . CARPAL TUNNEL RELEASE Right 07/04/2017   Procedure: RIGHT CARPAL TUNNEL RELEASE;  Surgeon: Leanora Cover, MD;  Location: [place];  Service: Orthopedics;  Laterality: Right;  . CARPAL TUNNEL RELEASE Left 09/12/2017   Procedure: LEFT CARPAL TUNNEL RELEASE;  Surgeon: Leanora Cover, MD;  Location: [place];  Service: Orthopedics;  Laterality: Left;  . COLONOSCOPY  11/30/2011   Procedure: COLONOSCOPY;  Surgeon: Lafayette Dragon, MD;  Location: [facility]OS[facility];  Service: Endoscopy;  Laterality: N/Sena Hoopingarner;  . CORONARY ANGIOPLASTY WITH STENT PLACEMENT    . ESOPHAGOGASTRODUODENOSCOPY  11/30/2011   Procedure: ESOPHAGOGASTRODUODENOSCOPY (EGD);  Surgeon: Lafayette Dragon, MD;  Location: [facility];  Service: Endoscopy;  Laterality: N/Melicia Esqueda;  . ESOPHAGOGASTRODUODENOSCOPY N/Orby Tangen 02/23/2015   Procedure: ESOPHAGOGASTRODUODENOSCOPY (EGD);  Surgeon: Gatha Mayer, MD;  Location: [facility] ENDOSCOPY;  Service: Endoscopy;  Laterality: N/Aldyn Toon;  . LEFT HEART CATHETERIZATION WITH CORONARY ANGIOGRAM N/Alexiz Cothran 02/20/2011   Procedure: LEFT HEART CATHETERIZATION WITH CORONARY ANGIOGRAM;  Surgeon: Clent Demark, MD;  Location: [place] CATH LAB;  Service: Cardiovascular;  Laterality: N/Espiridion Supinski;  . MASTECTOMY Right 11/05/2017  . MASTECTOMY W/ SENTINEL NODE BIOPSY Right 11/27/2017  .  MASTECTOMY W/ SENTINEL NODE BIOPSY Right 11/27/2017   Procedure: RIGHT TOTAL MASTECTOMY WITH SENTINEL LYMPH NODE BIOPSY;  Surgeon: Excell Seltzer, MD;  Location: [place];  Service: General;  Laterality: Right;  . PORT Miguel Christiana CATH REVISION Left 05/07/2018   Procedure: PORT Luca Burston CATH REVISION;  Surgeon: Excell Seltzer, MD;  Location: [facility];  Service: General;  Laterality: Left;  . PORTACATH PLACEMENT Left 11/27/2017   Procedure: INSERTION  PORT-Naoma Boxell-CATH;  Surgeon: Excell Seltzer, MD;  Location: [place];  Service: General;  Laterality: Left;  . RIGHT COLECTOMY  02/2007   for post polypectomy colonic perforation  . TUBAL LIGATION       reports that she quit smoking about 8 years ago. Her smoking use included cigarettes. She has Draiden Mirsky 23.50 pack-year smoking history. She has never used smokeless tobacco. She reports previous drug use. Drug: Cocaine. She reports that she does not drink alcohol.  No Known Allergies  Family History  Problem Relation Age of Onset  . Heart disease Mother   . Diabetes Mother   . Cancer Father        unsure what kind  . Diabetes Sister   . Colon cancer Neg Hx   . Breast cancer Neg Hx    Prior to Admission medications   Medication Sig Start Date End Date Taking? Authorizing Provider  acetaminophen (TYLENOL) 325 MG tablet Take 2 tablets (650 mg total) by mouth every 4 (four) hours as needed for headache or mild pain. 07/14/18  Yes Charolette Forward, MD  albuterol (PROVENTIL) (2.5 MG/3ML) 0.083% nebulizer solution INHALE THE CONTENTS OF 1 VIAL USING NEBULIZER EVERY 6 HOURS AS NEEDEDFOR WHEEZING OR SHORTNESS OF BREATH Patient taking differently: Take 2.5 mg by nebulization every 6 (six) hours as needed for wheezing or shortness of breath.  08/07/18  Yes Nicholas Lose, MD  allopurinol (ZYLOPRIM) 100 MG tablet Take 100 mg by mouth daily. 09/17/18  Yes [provider]  ALPRAZolam (XANAX) 0.25 MG tablet Take 0.25 mg by mouth daily.    Yes [provider]  amLODipine (NORVASC) 5 MG tablet Take 5 mg by mouth daily.   Yes [provider]  anastrozole (ARIMIDEX) 1 MG tablet Take 1 tablet (1 mg total) by mouth daily. 02/27/18  Yes Nicholas Lose, MD  aspirin EC 81 MG EC tablet Take 1 tablet (81 mg total) by mouth daily. 02/06/12  Yes Patrecia Pour, NP  atorvastatin (LIPITOR) 20 MG tablet Take 20 mg by mouth daily.   Yes [provider]  budesonide-formoterol (SYMBICORT) 160-4.5 MCG/ACT  inhaler Inhale 1 puff into the lungs 2 (two) times daily.    Yes [provider]  colchicine 0.6 MG tablet Take 0.6 mg by mouth daily as needed for pain. 07/17/18  Yes [provider]  feeding supplement, ENSURE ENLIVE, (ENSURE ENLIVE) LIQD Take 237 mLs by mouth 2 (two) times daily between meals. 02/24/15  Yes Ghimire, Henreitta Leber, MD  lactulose (CHRONULAC) 10 GM/15ML solution Take 30 mLs by mouth 3 (three) times daily. 09/17/18  Yes [provider]  LANTUS SOLOSTAR 100 UNIT/ML Solostar Pen Inject 10 Units into the skin daily. 07/24/18  Yes [provider]  loperamide (IMODIUM) 2 MG capsule Take 1 capsule (2 mg total) by mouth 4 (four) times daily as needed for diarrhea or loose stools. 02/27/15  Yes Marella Chimes, PA-C  metoprolol succinate (TOPROL-XL) 50 MG 24 hr tablet Take 50 mg by mouth at bedtime. Take with or immediately following Anajah Sterbenz meal.   Yes  [provider]  Omega-3 Fatty Acids (FISH OIL) 1000 MG CAPS Take 1,000 mg by mouth daily.   Yes [provider]  ondansetron (ZOFRAN) 8 MG tablet Take 1 tablet (8 mg total) by mouth every 8 (eight) hours as needed for nausea or vomiting. 06/16/18  Yes Nicholas Lose, MD  pantoprazole (PROTONIX) 40 MG tablet Take 1 tablet (40 mg total) by mouth daily. 02/24/15  Yes Ghimire, Henreitta Leber, MD  traMADol (ULTRAM) 50 MG tablet Take 50-100 mg by mouth every 6 (six) hours as needed for pain. 09/17/18  Yes [provider]  nitroGLYCERIN (NITROSTAT) 0.4 MG SL tablet Place 1 tablet (0.4 mg total) under the tongue every 5 (five) minutes as needed. For chest pain Patient taking differently: Place 0.4 mg under the tongue every 5 (five) minutes as needed for chest pain.  02/06/12   Patrecia Pour, NP    Physical Exam: Vitals:   09/18/18 1730 09/18/18 1800 09/18/18 1824 09/18/18 1830  BP: (!) 163/74 (!) 157/74 (!) 157/74 (!) 167/83  Pulse: 65 66 64 69  Resp:   15 18  Temp:      TempSrc:      SpO2: 93% 95%  96% 96%  Weight:      Height:        Constitutional: NAD, calm, comfortable Vitals:   09/18/18 1730 09/18/18 1800 09/18/18 1824 09/18/18 1830  BP: (!) 163/74 (!) 157/74 (!) 157/74 (!) 167/83  Pulse: 65 66 64 69  Resp:   15 18  Temp:      TempSrc:      SpO2: 93% 95% 96% 96%  Weight:      Height:       Eyes: PERRL, lids and conjunctivae normal ENMT: Mucous membranes are moist. Posterior pharynx clear of any exudate or lesions.Normal dentition.  Neck: normal, supple, no masses, no thyromegaly Respiratory: clear to auscultation bilaterally, no wheezing, no crackles. Normal respiratory effort. No accessory muscle use.  Cardiovascular: Regular rate and rhythm, no murmurs / rubs / gallops. No extremity edema. Abdomen: mild epigastric tenderness, no masses palpated. No hepatosplenomegaly.  Musculoskeletal: no clubbing / cyanosis. No joint deformity upper and lower extremities. Good ROM, no contractures. Normal muscle tone.  Skin: no rashes, lesions, ulcers. No induration Neurologic: CN 2-12 grossly intact. Sensation intact. Moving all extremities.  Psychiatric: Normal judgment and insight. Alert and oriented x 3. Normal mood.   Labs on Admission: I have personally reviewed following labs and imaging studies  CBC: Recent Labs  Lab 09/18/18 1216  WBC 7.3  NEUTROABS 5.4  HGB 11.0*  HCT 35.1*  MCV 99.2  PLT 443   Basic Metabolic Panel: Recent Labs  Lab 09/18/18 1216  NA 136  K 4.6  CL 100  CO2 24  GLUCOSE 179*  BUN 25*  CREATININE 1.80*  CALCIUM 10.5*   GFR: Estimated Creatinine Clearance: 22.6 mL/min ([name]) (by C-G formula based on SCr of 1.8 mg/dL (H)). Liver Function Tests: Recent Labs  Lab 09/18/18 1216  AST 215*  ALT 325*  ALKPHOS 645*  BILITOT 5.5*  PROT 9.8*  ALBUMIN 4.0   Recent Labs  Lab 09/18/18 1216  LIPASE 44   No results for input(s): AMMONIA in the last 168 hours. Coagulation Profile: No results for input(s): INR, PROTIME in the last 168  hours. Cardiac Enzymes: No results for input(s): CKTOTAL, CKMB, CKMBINDEX, TROPONINI in the last 168 hours. BNP (last 3 results) No results for input(s): PROBNP in the last 8760 hours. HbA1C:  No results for input(s): HGBA1C in the last 72 hours. CBG: No results for input(s): GLUCAP in the last 168 hours. Lipid Profile: No results for input(s): CHOL, HDL, LDLCALC, TRIG, CHOLHDL, LDLDIRECT in the last 72 hours. Thyroid Function Tests: No results for input(s): TSH, T4TOTAL, FREET4, T3FREE, THYROIDAB in the last 72 hours. Anemia Panel: No results for input(s): VITAMINB12, FOLATE, FERRITIN, TIBC, IRON, RETICCTPCT in the last 72 hours. Urine analysis:    Component Value Date/Time   COLORURINE YELLOW 11/27/2015 0743   APPEARANCEUR CLOUDY ([name]) 11/27/2015 0743   LABSPEC 1.019 11/27/2015 0743   PHURINE 6.0 11/27/2015 0743   GLUCOSEU NEGATIVE 11/27/2015 0743   HGBUR NEGATIVE 11/27/2015 0743   BILIRUBINUR NEGATIVE 11/27/2015 0743   KETONESUR NEGATIVE 11/27/2015 0743   PROTEINUR NEGATIVE 11/27/2015 0743   UROBILINOGEN 0.2 03/31/2014 2017   NITRITE NEGATIVE 11/27/2015 0743   LEUKOCYTESUR MODERATE ([name]) 11/27/2015 0743    Radiological Exams on Admission: Ct Abdomen Pelvis W Contrast  Result Date: 09/18/2018 CLINICAL DATA:  Abdominal pain and distension. History of breast carcinoma EXAM: CT ABDOMEN AND PELVIS WITH CONTRAST TECHNIQUE: Multidetector CT imaging of the abdomen and pelvis was performed using the standard protocol following bolus administration of intravenous contrast. CONTRAST:  74mL OMNIPAQUE IOHEXOL 300 MG/ML  SOLN COMPARISON:  December 18, 2017 FINDINGS: Lower chest: There is bibasilar atelectasis. There is mild bronchiectatic change in the lung bases as well. There are foci of coronary artery calcification. Hepatobiliary: There is hepatic steatosis. No focal liver lesions are evident. There is cholelithiasis. The gallbladder wall is diffusely thickened and edematous. There is loss of  fat plane between the gallbladder and the pylorus/first portion of the duodenum. There is no air within the gallbladder. There is no appreciable intrahepatic biliary duct dilatation. There is dilatation of the distal common bile duct, measuring 1.2 cm. No biliary duct mass or calculus evident. Pancreas: No pancreatic mass or inflammatory focus evident. No pancreatic duct dilatation. Spleen: No splenic lesions are evident. Adrenals/Urinary Tract: Adrenals bilaterally appear normal. There are subcentimeter cysts in the right kidney. There is an extrarenal pelvis on the right, an anatomic variant. There is no hydronephrosis on either side. There is or ureteral calculus on either side. Urinary bladder wall thickness is within normal limits. Stomach/Bowel: As noted above, there is loss of fat plane between the gallbladder and the pylorus/first portion of the duodenum. There is wall thickening in the distal stomach and proximal duodenum. There is no other appreciable bowel wall thickening. The patient has had [name] partial right colectomy with anastomosis patent in this area. Terminal ileum appears unremarkable. No evident bowel obstruction. There is no free air or portal venous air. Vascular/Lymphatic: There is aortic and iliac artery atherosclerosis. No aneurysm evident. Major mesenteric arterial vessels appear patent, although there is severe narrowing at the origin of the right renal artery and extensive calcification with severe narrowing in the proximal left renal artery. No adenopathy is evident in the abdomen or pelvis. Reproductive: There are calcifications in the uterus, felt to be indicative of leiomyomatous change. The uterus is slightly canted toward the right. No pelvic mass is demonstrable. Other: Appendix absent. There is no abscess or ascites evident in the abdomen or pelvis. There is fat in each inguinal ring. Musculoskeletal: There are no blastic or lytic bone lesions. There is bony hypertrophy at L4 and  diffuse disc protrusion. These findings in concert leads spinal stenosis. Similar changes are noted at L3-4 with spinal stenosis also present at L3-4. No blastic or  lytic bone lesions. No intramuscular lesions are evident. IMPRESSION: 1. There is cholelithiasis with diffuse gallbladder wall thickening and edema. There is loss of fat plane between the gallbladder and the distal stomach/proximal duodenum. 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 No air seen in the gallbladder. 2. There is dilatation of the common hepatic and common bile ducts. No intrahepatic biliary duct dilatation seen. No biliary duct mass or calculus evident. Charon Smedberg mass at the ampulla could cause these findings. From an imaging standpoint, MRCP would be the optimum study of choice to further evaluate in this regard. 3.  Hepatic steatosis.  No focal liver lesions appreciable. 4. No bowel obstruction. No abscess in the abdomen or pelvis. The patient is status post partial right colectomy with anastomosis patent. 5. Spinal stenosis at L3-4 and L4-5 due to disc protrusions and bony hypertrophy at these levels. 6. Extensive calcification and narrowing in the proximal renal arteries bilaterally. Question whether patient is hypertensive; there may well be renovascular hypertension given this appearance. 7.  Extensive aortic and iliac artery atherosclerosis. 8. Calcifications in the uterus consistent with leiomyomatous change. Electronically Signed   By: Lowella Grip III M.D.   On: 09/18/2018 14:26   Dg Abdomen Acute W/chest  Result Date: 09/18/2018 CLINICAL DATA:  Acute generalized abdominal pain. EXAM: DG ABDOMEN ACUTE W/ 1V CHEST COMPARISON:  Radiographs of April 10, 2018 and February 27, 2015. FINDINGS: There is no evidence of dilated bowel loops or free intraperitoneal air. No radiopaque calculi or other significant radiographic abnormality is seen. Stable cardiomediastinal silhouette is noted. Atherosclerosis of thoracic  aorta is noted. Left internal jugular Port-[name] is noted in grossly good position. Both lungs are clear. IMPRESSION: No evidence of bowel obstruction or ileus. No acute cardiopulmonary disease. Aortic Atherosclerosis (ICD10-I70.0). Electronically Signed   By: Marijo Conception M.D.   On: 09/18/2018 13:24    EKG: Independently reviewed. 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 Lindi Adie.  On letrozole and herceptin.  On hold for now during this admission. Dr. Lindi Adie made aware of admission  Hypertension: continue amlodipine, metoprolol Of note, pt with calcification and narrowing in proximal renal arteries, follow outpatient  CAD s/p stent in 2013: ASA, lipitor, no CP at this time  CKD IV: creatine lower than recent baseline (2.2).  CTM.  Anxiety: continue xanax  Asthma: continue symbicort (pharmacy sub)  GERD: continue PPI  Of note, pt was not clear what meds she was taking.  She says they have not changed since recent discharge, so used this to do med rec (Dr. Terrence Dupont d/c summary in June).  I requested pharmacy med tech to see if they can discuss with family to confirm no meds have changed.  DVT prophylaxis: SCD  Code Status: full  Family Communication: none at bedside, pt declines me calling, surgery just spoke to sister Disposition Plan: pending further imaging, w/u Consults called: GI, surgery  Admission status: inpatient     Fayrene Helper MD [facility] Pager AMION  If 7PM-7AM, please contact night-coverage [email or web address] Password [facility]  09/18/2018, 6:48 PM

## 2018-09-18 NOTE — ED Notes (Signed)
Patient transported to MRI 

## 2018-09-18 NOTE — Consult Note (Signed)
Referring Provider: Rodell Perna, PA-C Primary Care Physician:  Tsosie Billing, MD (Inactive) Primary Gastroenterologist:  Dr. Carlean Purl  Reason for Consultation:  Elevated LFT's; GI bleeding  HPI: [name] is a 68 y.o. female with multiple medical problems as listed below.  She presented to the ED today from her PCP's office.  She is a poor historian but tells me that she was being seen at her PCPs office for complaints of abdominal pain.  Says that the pain is in her upper abdomen that has been present for "some months".  Worsened recently.  When she was seen in her PCPs office a Hemoccult was performed and she was found to be heme positive with blood on exam so was brought to the emergency department.  She says that the rectal bleeding just began last night after drinking some lactulose that she was given by or recommended to take by her PCP.  Denies any bleeding prior to last night.  Anyway, upon evaluation in the ER Hgb is 11.0 grams, stable compared to values over the past 2 months.  But she was also noted to have new elevation in LFT's with AST 215, ALT 325, ALP 645, and total bili 5.5.  CT scan of the abdomen and pelvis with contrast was performed and showed the following:  IMPRESSION: 1. There is cholelithiasis with diffuse gallbladder wall thickening and edema. There is loss of fat plane between the gallbladder and the distal stomach/proximal duodenum. 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 No air seen in the gallbladder.  2. There is dilatation of the common hepatic and common bile ducts. No intrahepatic biliary duct dilatation seen. No biliary duct mass or calculus evident. A mass at the ampulla could cause these findings. From an imaging standpoint, MRCP would be the optimum study of choice to further evaluate in this regard.  3.  Hepatic steatosis.  No focal liver lesions appreciable.  4. No bowel obstruction. No abscess in the  abdomen or pelvis. The patient is status post partial right colectomy with anastomosis patent.  5. Spinal stenosis at L3-4 and L4-5 due to disc protrusions and bony hypertrophy at these levels.  6. Extensive calcification and narrowing in the proximal renal arteries bilaterally. Question whether patient is hypertensive; there may well be renovascular hypertension given this appearance.  7.  Extensive aortic and iliac artery atherosclerosis.  8. Calcifications in the uterus consistent with leiomyomatous Change.   MRI abdomen/MRCP has already been ordered.  She has been placed on abx and surgery is evaluating the patient as well.  Patient tells me "I don't understand any of this.  They talked to my sister on the phone early and explained to her."   GI procedure history:  EGD by Dr. Carlean Purl 02/2015 showed a few 2-3 mm flat white polyps in proximal stomach. Consistent with fundic gland polyps. Otherwise normal.  Last colonoscopy was 11/2011 by Dr. Olevia Perches and showed 2 polyps that were removed, hyperplastic polyps.  Capsule endoscopy 11/2012 for evaluation of anemia and heme positive stools showed tiny scattered AVM's throughout the small bowel with no active bleeding.    Past Medical History:  Diagnosis Date  . Angina   . Arthritis    HANDS"  . Asthma   . Cancer ([place]) 09/04/2017   Right Breast  . Carpal tunnel syndrome, bilateral 11/26/2016  . Cholelithiasis   . Cocaine abuse ([place]) 07/2012   per E.R. drug screen  . Coronary artery disease   . Diabetes mellitus  without mention of complication    type 2  . Dysphagia    esophageal dysmotility on 03/2011 esophagram   . Dyspnea    ongoing   . Fatty liver disease, nonalcoholic 5701   on Imaging.   Marland Kitchen GERD (gastroesophageal reflux disease)   . Headache   . History of colonic polyps 2009   adenomatous 2009, HP in 2009, 2011, 2013.   Marland Kitchen Hyperlipidemia   . Hypertension   . Iron deficiency anemia   . Myocardial infarction  ([place])    years ago, 6 stents placed  . 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 ABDOMINAL ANGIOGRAM  02/20/2011   Procedure: ABDOMINAL ANGIOGRAM;  Surgeon: Clent Demark, MD;  Location: [facility] CATH LAB;  Service: Cardiovascular;;  . CARPAL TUNNEL RELEASE Right 07/04/2017   Procedure: RIGHT CARPAL TUNNEL RELEASE;  Surgeon: Leanora Cover, MD;  Location: [place];  Service: Orthopedics;  Laterality: Right;  . CARPAL TUNNEL RELEASE Left 09/12/2017   Procedure: LEFT CARPAL TUNNEL RELEASE;  Surgeon: Leanora Cover, MD;  Location: [place];  Service: Orthopedics;  Laterality: Left;  . COLONOSCOPY  11/30/2011   Procedure: COLONOSCOPY;  Surgeon: Lafayette Dragon, MD;  Location: [facility] ENDOSCOPY;  Service: Endoscopy;  Laterality: N/A;  . CORONARY ANGIOPLASTY WITH STENT PLACEMENT    . ESOPHAGOGASTRODUODENOSCOPY  11/30/2011   Procedure: ESOPHAGOGASTRODUODENOSCOPY (EGD);  Surgeon: Lafayette Dragon, MD;  Location: [facility];  Service: Endoscopy;  Laterality: N/A;  . ESOPHAGOGASTRODUODENOSCOPY N/A 02/23/2015   Procedure: ESOPHAGOGASTRODUODENOSCOPY (EGD);  Surgeon: Gatha Mayer, MD;  Location: [facility] ENDOSCOPY;  Service: Endoscopy;  Laterality: N/A;  . LEFT HEART CATHETERIZATION WITH CORONARY ANGIOGRAM N/A 02/20/2011   Procedure: LEFT HEART CATHETERIZATION WITH CORONARY ANGIOGRAM;  Surgeon: Clent Demark, MD;  Location: [place] CATH LAB;  Service: Cardiovascular;  Laterality: N/A;  . MASTECTOMY Right 11/05/2017  . MASTECTOMY W/ SENTINEL NODE BIOPSY Right 11/27/2017  . MASTECTOMY W/ SENTINEL NODE BIOPSY Right 11/27/2017   Procedure: RIGHT TOTAL MASTECTOMY WITH SENTINEL LYMPH NODE BIOPSY;  Surgeon: Excell Seltzer, MD;  Location: [place];  Service: General;  Laterality: Right;  . PORT A CATH REVISION Left 05/07/2018   Procedure: PORT A CATH REVISION;  Surgeon: Excell Seltzer, MD;  Location: [facility] ORS;  Service: General;  Laterality: Left;   . PORTACATH PLACEMENT Left 11/27/2017   Procedure: INSERTION PORT-A-CATH;  Surgeon: Excell Seltzer, MD;  Location: [place];  Service: General;  Laterality: Left;  . RIGHT COLECTOMY  02/2007   for post polypectomy colonic perforation  . TUBAL LIGATION      Prior to Admission medications   Medication Sig Start Date End Date Taking? Authorizing Provider  acetaminophen (TYLENOL) 325 MG tablet Take 2 tablets (650 mg total) by mouth every 4 (four) hours as needed for headache or mild pain. 07/14/18   Charolette Forward, MD  albuterol (PROVENTIL) (2.5 MG/3ML) 0.083% nebulizer solution INHALE THE CONTENTS OF 1 VIAL USING NEBULIZER EVERY 6 HOURS AS NEEDEDFOR WHEEZING OR SHORTNESS OF BREATH 08/07/18   Nicholas Lose, MD  ALPRAZolam Duanne Moron) 0.25 MG tablet Take 0.25 mg by mouth 2 (two) times daily.     [provider]  amLODipine (NORVASC) 5 MG tablet Take 5 mg by mouth daily.    [provider]  anastrozole (ARIMIDEX) 1 MG tablet Take 1 tablet (1 mg total) by mouth daily. 02/27/18   Nicholas Lose, MD  aspirin EC 81 MG EC tablet Take 1  tablet (81 mg total) by mouth daily. 02/06/12   Patrecia Pour, NP  atorvastatin (LIPITOR) 20 MG tablet Take 20 mg by mouth daily.    [provider]  budesonide-formoterol (SYMBICORT) 160-4.5 MCG/ACT inhaler Inhale 1 puff into the lungs 2 (two) times daily.     [provider]  feeding supplement, ENSURE ENLIVE, (ENSURE ENLIVE) LIQD Take 237 mLs by mouth 2 (two) times daily between meals. 02/24/15   Ghimire, Henreitta Leber, MD  insulin glargine (LANTUS) 100 UNIT/ML injection 10 units subcutaneous daily at night 07/14/18 07/14/19  Charolette Forward, MD  loperamide (IMODIUM) 2 MG capsule Take 1 capsule (2 mg total) by mouth 4 (four) times daily as needed for diarrhea or loose stools. 02/27/15   Marella Chimes, PA-C  metoprolol succinate (TOPROL-XL) 50 MG 24 hr tablet Take 50 mg by mouth at bedtime. Take with or immediately following a meal.    [provider]  nitroGLYCERIN (NITROSTAT) 0.4 MG SL tablet Place 1 tablet (0.4 mg total) under the tongue every 5 (five) minutes as needed. For chest pain Patient taking differently: Place 0.4 mg under the tongue every 5 (five) minutes as needed for chest pain.  02/06/12   Patrecia Pour, NP  Omega-3 Fatty Acids (FISH OIL) 1000 MG CAPS Take 1,000 mg by mouth daily.    [provider]  ondansetron (ZOFRAN) 8 MG tablet Take 1 tablet (8 mg total) by mouth every 8 (eight) hours as needed for nausea or vomiting. 06/16/18   Nicholas Lose, MD  pantoprazole (PROTONIX) 40 MG tablet Take 1 tablet (40 mg total) by mouth daily. 02/24/15   Ghimire, Henreitta Leber, MD    Current Facility-Administered Medications  Medication Dose Route Frequency Provider Last Rate Last Dose  . cefTRIAXone (ROCEPHIN) 2 g in sodium chloride 0.9 % 100 mL IVPB  2 g Intravenous Once Fawze, Mina A, PA-C      . metroNIDAZOLE (FLAGYL) IVPB 500 mg  500 mg Intravenous Q8H Powell, Kendra Opitz., MD      . sodium chloride (PF) 0.9 % injection            Current Outpatient Medications  Medication Sig Dispense Refill  . acetaminophen (TYLENOL) 325 MG tablet Take 2 tablets (650 mg total) by mouth every 4 (four) hours as needed for headache or mild pain. 60 tablet 1  . albuterol (PROVENTIL) (2.5 MG/3ML) 0.083% nebulizer solution INHALE THE CONTENTS OF 1 VIAL USING NEBULIZER EVERY 6 HOURS AS NEEDEDFOR WHEEZING OR SHORTNESS OF BREATH 75 mL 2  . ALPRAZolam (XANAX) 0.25 MG tablet Take 0.25 mg by mouth 2 (two) times daily.     Marland Kitchen amLODipine (NORVASC) 5 MG tablet Take 5 mg by mouth daily.    Marland Kitchen anastrozole (ARIMIDEX) 1 MG tablet Take 1 tablet (1 mg total) by mouth daily. 90 tablet 3  . aspirin EC 81 MG EC tablet Take 1 tablet (81 mg total) by mouth daily. 30 tablet 0  . atorvastatin (LIPITOR) 20 MG tablet Take 20 mg by mouth daily.    . budesonide-formoterol (SYMBICORT) 160-4.5 MCG/ACT inhaler Inhale 1 puff into the lungs 2 (two) times daily.      . feeding supplement, ENSURE ENLIVE, (ENSURE ENLIVE) LIQD Take 237 mLs by mouth 2 (two) times daily between meals. 60 Bottle 0  . insulin glargine (LANTUS) 100 UNIT/ML injection 10 units subcutaneous daily at night 10 mL 3  . loperamide (IMODIUM) 2 MG capsule Take 1 capsule (2 mg total) by mouth  4 (four) times daily as needed for diarrhea or loose stools. 12 capsule 0  . metoprolol succinate (TOPROL-XL) 50 MG 24 hr tablet Take 50 mg by mouth at bedtime. Take with or immediately following a meal.    . nitroGLYCERIN (NITROSTAT) 0.4 MG SL tablet Place 1 tablet (0.4 mg total) under the tongue every 5 (five) minutes as needed. For chest pain (Patient taking differently: Place 0.4 mg under the tongue every 5 (five) minutes as needed for chest pain. ) 10 tablet 0  . Omega-3 Fatty Acids (FISH OIL) 1000 MG CAPS Take 1,000 mg by mouth daily.    . ondansetron (ZOFRAN) 8 MG tablet Take 1 tablet (8 mg total) by mouth every 8 (eight) hours as needed for nausea or vomiting. 20 tablet 0  . pantoprazole (PROTONIX) 40 MG tablet Take 1 tablet (40 mg total) by mouth daily. 60 tablet 0    Allergies as of 09/18/2018  . (No Known Allergies)    Family History  Problem Relation Age of Onset  . Heart disease Mother   . Diabetes Mother   . Cancer Father        unsure what kind  . Diabetes Sister   . Colon cancer Neg Hx   . Breast cancer Neg Hx     Social History   Socioeconomic History  . Marital status: Divorced    Spouse name: Not on file  . Number of children: 2  . Years of education: Not on file  . Highest education level: Not on file  Occupational History  . Occupation: retired. prev worked in housekeeping w/ [employer]  . Financial resource strain: Not on file  . Food insecurity    Worry: Not on file    Inability: Not on file  . Transportation needs    Medical: Yes    Non-medical: Yes  Tobacco Use  . Smoking status: Former Smoker    Packs/day: 0.50    Years: 47.00    Pack years:  23.50    Types: Cigarettes    Quit date: 05/09/2010    Years since quitting: 8.3  . Smokeless tobacco: Never Used  Substance and Sexual Activity  . Alcohol use: No  . Drug use: Not Currently    Types: Cocaine    Comment: positive drug screen (cocaine, benzo's)07/2012 in emergency room  . Sexual activity: Not Currently    Birth control/protection: Post-menopausal  Lifestyle  . Physical activity    Days per week: Not on file    Minutes per session: Not on file  . Stress: Not on file  Relationships  . Social Herbalist on phone: Not on file    Gets together: Not on file    Attends religious service: Not on file    Active member of club or organization: Not on file    Attends meetings of clubs or organizations: Not on file    Relationship status: Not on file  . Intimate partner violence    Fear of current or ex partner: Not on file    Emotionally abused: Not on file    Physically abused: Not on file    Forced sexual activity: Not on file  Other Topics Concern  . Not on file  Social History Narrative   Caffeine use daily.    Review of Systems: ROS is O/W negative except as mentioned in HPI.  Physical Exam: Vital signs in last 24 hours: Temp:  [97.8 F (36.6 C)] 97.8  F (36.6 C) (08/13 1136) Pulse Rate:  [62-65] 62 (08/13 1230) Resp:  [15-17] 15 (08/13 1330) BP: (137-157)/(74-127) 157/79 (08/13 1330) SpO2:  [95 %-97 %] 97 % (08/13 1330) Weight:  [58.1 kg] 58.1 kg (08/13 1130)   General:  Alert, Well-developed, well-nourished, pleasant and cooperative in NAD; poor historian, somewhat sleepy Head:  Normocephalic and atraumatic. Eyes:  Scleral icterus noted. Ears:  Normal auditory acuity. Mouth:  No deformity or lesions.   Lungs:  Clear throughout to auscultation.  No wheezes, crackles, or rhonchi.  Heart:  Regular rate and rhythm; no murmurs, clicks, rubs, or gallops. Abdomen:  Soft, distended appearing.  Previous laparotomy scar noted.  BS present.  TTP > in  the RUQ. Rectal:  Not performed by me today, but ED PA reported small clot on DRE. Msk:  Symmetrical without gross deformities. Pulses:  Normal pulses noted. Extremities:  Without clubbing or edema. Neurologic:  Alert and oriented x 4;  grossly normal neurologically. Skin:  Intact without significant lesions or rashes. Psych:  Alert and cooperative. Normal mood and affect.  Lab Results: Recent Labs    09/18/18 1216  WBC 7.3  HGB 11.0*  HCT 35.1*  PLT 380   BMET Recent Labs    09/18/18 1216  NA 136  K 4.6  CL 100  CO2 24  GLUCOSE 179*  BUN 25*  CREATININE 1.80*  CALCIUM 10.5*   LFT Recent Labs    09/18/18 1216  PROT 9.8*  ALBUMIN 4.0  AST 215*  ALT 325*  ALKPHOS 645*  BILITOT 5.5*   Studies/Results: Ct Abdomen Pelvis W Contrast  Result Date: 09/18/2018 CLINICAL DATA:  Abdominal pain and distension. History of breast carcinoma EXAM: CT ABDOMEN AND PELVIS WITH CONTRAST TECHNIQUE: Multidetector CT imaging of the abdomen and pelvis was performed using the standard protocol following bolus administration of intravenous contrast. CONTRAST:  36mL OMNIPAQUE IOHEXOL 300 MG/ML  SOLN COMPARISON:  December 18, 2017 FINDINGS: Lower chest: There is bibasilar atelectasis. There is mild bronchiectatic change in the lung bases as well. There are foci of coronary artery calcification. Hepatobiliary: There is hepatic steatosis. No focal liver lesions are evident. There is cholelithiasis. The gallbladder wall is diffusely thickened and edematous. There is loss of fat plane between the gallbladder and the pylorus/first portion of the duodenum. There is no air within the gallbladder. There is no appreciable intrahepatic biliary duct dilatation. There is dilatation of the distal common bile duct, measuring 1.2 cm. No biliary duct mass or calculus evident. Pancreas: No pancreatic mass or inflammatory focus evident. No pancreatic duct dilatation. Spleen: No splenic lesions are evident.  Adrenals/Urinary Tract: Adrenals bilaterally appear normal. There are subcentimeter cysts in the right kidney. There is an extrarenal pelvis on the right, an anatomic variant. There is no hydronephrosis on either side. There is or ureteral calculus on either side. Urinary bladder wall thickness is within normal limits. Stomach/Bowel: As noted above, there is loss of fat plane between the gallbladder and the pylorus/first portion of the duodenum. There is wall thickening in the distal stomach and proximal duodenum. There is no other appreciable bowel wall thickening. The patient has had a partial right colectomy with anastomosis patent in this area. Terminal ileum appears unremarkable. No evident bowel obstruction. There is no free air or portal venous air. Vascular/Lymphatic: There is aortic and iliac artery atherosclerosis. No aneurysm evident. Major mesenteric arterial vessels appear patent, although there is severe narrowing at the origin of the right renal artery and extensive calcification  with severe narrowing in the proximal left renal artery. No adenopathy is evident in the abdomen or pelvis. Reproductive: There are calcifications in the uterus, felt to be indicative of leiomyomatous change. The uterus is slightly canted toward the right. No pelvic mass is demonstrable. Other: Appendix absent. There is no abscess or ascites evident in the abdomen or pelvis. There is fat in each inguinal ring. Musculoskeletal: There are no blastic or lytic bone lesions. There is bony hypertrophy at L4 and diffuse disc protrusion. These findings in concert leads spinal stenosis. Similar changes are noted at L3-4 with spinal stenosis also present at L3-4. No blastic or lytic bone lesions. No intramuscular lesions are evident. IMPRESSION: 1. There is cholelithiasis with diffuse gallbladder wall thickening and edema. There is loss of fat plane between the gallbladder and the distal stomach/proximal duodenum. 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 No air seen in the gallbladder. 2. There is dilatation of the common hepatic and common bile ducts. No intrahepatic biliary duct dilatation seen. No biliary duct mass or calculus evident. A mass at the ampulla could cause these findings. From an imaging standpoint, MRCP would be the optimum study of choice to further evaluate in this regard. 3.  Hepatic steatosis.  No focal liver lesions appreciable. 4. No bowel obstruction. No abscess in the abdomen or pelvis. The patient is status post partial right colectomy with anastomosis patent. 5. Spinal stenosis at L3-4 and L4-5 due to disc protrusions and bony hypertrophy at these levels. 6. Extensive calcification and narrowing in the proximal renal arteries bilaterally. Question whether patient is hypertensive; there may well be renovascular hypertension given this appearance. 7.  Extensive aortic and iliac artery atherosclerosis. 8. Calcifications in the uterus consistent with leiomyomatous change. Electronically Signed   By: Lowella Grip III M.D.   On: 09/18/2018 14:26   Dg Abdomen Acute W/chest  Result Date: 09/18/2018 CLINICAL DATA:  Acute generalized abdominal pain. EXAM: DG ABDOMEN ACUTE W/ 1V CHEST COMPARISON:  Radiographs of April 10, 2018 and February 27, 2015. FINDINGS: There is no evidence of dilated bowel loops or free intraperitoneal air. No radiopaque calculi or other significant radiographic abnormality is seen. Stable cardiomediastinal silhouette is noted. Atherosclerosis of thoracic aorta is noted. Left internal jugular Port-A-Cath is noted in grossly good position. Both lungs are clear. IMPRESSION: No evidence of bowel obstruction or ileus. No acute cardiopulmonary disease. Aortic Atherosclerosis (ICD10-I70.0). Electronically Signed   By: Marijo Conception M.D.   On: 09/18/2018 13:24   IMPRESSION:  *Elevated LFT's in the setting of abdominal pain that has been present for quite some time but acutely  worsened.  CT scan shows cholelithiasis with wall thickening and edema.  ? cholecysto-enteric fistula.  CBD dilated, but no definite stone seen.  MRCP has already been ordered. *GI bleed/heme positive stools:  Just began last evening after drinking lactulose.  Hgb 11.0 grams, stable as compared to levels over the past 2 months.   *Previous colon resection (right hemicolectomy) for perforated colon during polypectomy in 2009 *Chronic anemia:  MCV is normal.  Was evaluated by our service in 2013/2014 for anemia and heme positive stools with EGD, colonoscopy, wireless capsule endoscopy as stated above.  PLAN: *Await MRCP.  May need ERCP pending results. *Trend LFT's. *Abx coverage. *Surgery following. *Monitor Hgb and rectal bleeding for now.   Laban Emperor. Zehr  09/18/2018, 3:41 PM

## 2018-09-19 ENCOUNTER — Inpatient Hospital Stay (HOSPITAL_COMMUNITY): Payer: Medicare Other

## 2018-09-19 DIAGNOSIS — K8000 Calculus of gallbladder with acute cholecystitis without obstruction: Secondary | ICD-10-CM

## 2018-09-19 DIAGNOSIS — R748 Abnormal levels of other serum enzymes: Secondary | ICD-10-CM

## 2018-09-19 LAB — COMPREHENSIVE METABOLIC PANEL
ALT: 243 U/L — ABNORMAL HIGH (ref 0–44)
AST: 134 U/L — ABNORMAL HIGH (ref 15–41)
Albumin: 3.4 g/dL — ABNORMAL LOW (ref 3.5–5.0)
Alkaline Phosphatase: 577 U/L — ABNORMAL HIGH (ref 38–126)
Anion gap: 10 (ref 5–15)
BUN: 22 mg/dL (ref 8–23)
CO2: 24 mmol/L (ref 22–32)
Calcium: 9.5 mg/dL (ref 8.9–10.3)
Chloride: 102 mmol/L (ref 98–111)
Creatinine, Ser: 1.66 mg/dL — ABNORMAL HIGH (ref 0.44–1.00)
GFR calc Af Amer: 36 mL/min — ABNORMAL LOW (ref >60)
GFR calc non Af Amer: 31 mL/min — ABNORMAL LOW (ref >60)
Glucose, Bld: 132 mg/dL — ABNORMAL HIGH (ref 70–99)
Potassium: 4 mmol/L (ref 3.5–5.1)
Sodium: 136 mmol/L (ref 135–145)
Total Bilirubin: 3 mg/dL — ABNORMAL HIGH (ref 0.3–1.2)
Total Protein: 8.3 g/dL — ABNORMAL HIGH (ref 6.5–8.1)

## 2018-09-19 LAB — GLUCOSE, CAPILLARY
Glucose-Capillary: 143 mg/dL — ABNORMAL HIGH (ref 70–99)
Glucose-Capillary: 91 mg/dL (ref 70–99)
Glucose-Capillary: 86 mg/dL (ref 70–99)

## 2018-09-19 LAB — CBC
HCT: 33.2 % — ABNORMAL LOW (ref 36.0–46.0)
Hemoglobin: 10.2 g/dL — ABNORMAL LOW (ref 12.0–15.0)
MCH: 30.4 pg (ref 26.0–34.0)
MCHC: 30.7 g/dL (ref 30.0–36.0)
MCV: 99.1 fL (ref 80.0–100.0)
Platelets: 317 10*3/uL (ref 150–400)
RBC: 3.35 MIL/uL — ABNORMAL LOW (ref 3.87–5.11)
RDW: 14.2 % (ref 11.5–15.5)
WBC: 5.2 10*3/uL (ref 4.0–10.5)
nRBC: 0.4 % — ABNORMAL HIGH (ref 0.0–0.2)

## 2018-09-19 LAB — HEMOGLOBIN AND HEMATOCRIT, BLOOD
HCT: 31.6 % — ABNORMAL LOW (ref 36.0–46.0)
Hemoglobin: 9.9 g/dL — ABNORMAL LOW (ref 12.0–15.0)

## 2018-09-19 MED ORDER — MORPHINE SULFATE (PF) 4 MG/ML IV SOLN
0.0400 mg/kg | Freq: Once | INTRAVENOUS | Status: AC
  Administered 2018-09-19: 12:00:00 2.4 mg via INTRAVENOUS

## 2018-09-19 MED ORDER — SODIUM CHLORIDE 0.9% FLUSH: 10.0000 mL | INTRAVENOUS | Status: DC | PRN

## 2018-09-19 MED ORDER — DIPHENHYDRAMINE-ZINC ACETATE 2-0.1 % EX CREA
TOPICAL_CREAM | Freq: Two times a day (BID) | CUTANEOUS | Status: DC | PRN
  Administered 2018-09-19: 1 via TOPICAL
  Administered 2018-09-20 – 2018-09-21 (×2): via TOPICAL
  Filled 2018-09-19: qty 28

## 2018-09-19 MED ORDER — ONDANSETRON HCL 4 MG/2ML IJ SOLN: 4.0000 mg | Freq: Four times a day (QID) | INTRAMUSCULAR | Status: DC | PRN

## 2018-09-19 MED ORDER — SODIUM CHLORIDE 0.9 % IV SOLN
INTRAVENOUS | Status: DC
  Administered 2018-09-19 (×2): via INTRAVENOUS

## 2018-09-19 MED ORDER — ALBUTEROL SULFATE (2.5 MG/3ML) 0.083% IN NEBU: 2.5000 mg | INHALATION_SOLUTION | Freq: Four times a day (QID) | RESPIRATORY_TRACT | Status: DC | PRN

## 2018-09-19 MED ORDER — HYDROMORPHONE HCL 2 MG/ML IJ SOLN: 0.5000 mg | INTRAMUSCULAR | Status: DC | PRN

## 2018-09-19 MED ORDER — TECHNETIUM TC 99M MEBROFENIN IV KIT
7.1000 | PACK | Freq: Once | INTRAVENOUS | Status: AC | PRN
  Administered 2018-09-19: 12:00:00 7.1 via INTRAVENOUS

## 2018-09-19 MED ORDER — HYDRALAZINE HCL 20 MG/ML IJ SOLN
10.0000 mg | Freq: Four times a day (QID) | INTRAMUSCULAR | Status: DC | PRN
  Administered 2018-09-19: 10 mg via INTRAVENOUS
  Filled 2018-09-19: qty 1

## 2018-09-19 MED ORDER — LORAZEPAM 2 MG/ML IJ SOLN: 0.5000 mg | Freq: Every day | INTRAMUSCULAR | Status: DC | PRN

## 2018-09-19 MED ORDER — MORPHINE SULFATE (PF) 2 MG/ML IV SOLN
INTRAVENOUS | Status: AC
  Filled 2018-09-19: qty 2

## 2018-09-19 NOTE — Progress Notes (Signed)
Subjective/Chief Complaint: She reports abdominal pain in RUQ and epigastrium, but less   Objective: Vital signs in last 24 hours: Temp:  [97.8 F (36.6 C)-98.7 F (37.1 C)] 98.7 F (37.1 C) (08/14 0505) Pulse Rate:  [62-78] 78 (08/14 0505) Resp:  [13-24] 19 (08/14 0505) BP: (137-176)/(74-127) 174/76 (08/14 0505) SpO2:  [93 %-100 %] 97 % (08/14 0750) Weight:  [58.1 kg-60 kg] 60 kg (08/14 0505)    Intake/Output from previous day: 08/13 0701 - 08/14 0700 In: 194.3 [IV Piggyback:194.3] Out: 700 [Urine:700] Intake/Output this shift: No intake/output data recorded.  Exam: Awake and alert Abdomen with RUQ tenderness and guarding, slightly less than yesterday Lungs clear CV RRR  Lab Results:  Recent Labs    09/18/18 1216 09/18/18 2310 09/19/18 0548  WBC 7.3  --  5.2  HGB 11.0* 10.5* 10.2*  HCT 35.1* 34.5* 33.2*  PLT 380  --  317   BMET Recent Labs    09/18/18 1216 09/19/18 0548  NA 136 136  K 4.6 4.0  CL 100 102  CO2 24 24  GLUCOSE 179* 132*  BUN 25* 22  CREATININE 1.80* 1.66*  CALCIUM 10.5* 9.5   PT/INR No results for input(s): LABPROT, INR in the last 72 hours. ABG No results for input(s): PHART, HCO3 in the last 72 hours.  Invalid input(s): PCO2, PO2  Studies/Results: Ct Abdomen Pelvis W Contrast  Result Date: 09/18/2018 CLINICAL DATA:  Abdominal pain and distension. History of breast carcinoma EXAM: CT ABDOMEN AND PELVIS WITH CONTRAST TECHNIQUE: Multidetector CT imaging of the abdomen and pelvis was performed using the standard protocol following bolus administration of intravenous contrast. CONTRAST:  48mL OMNIPAQUE IOHEXOL 300 MG/ML  SOLN COMPARISON:  December 18, 2017 FINDINGS: Lower chest: There is bibasilar atelectasis. There is mild bronchiectatic change in the lung bases as well. There are foci of coronary artery calcification. Hepatobiliary: There is hepatic steatosis. No focal liver lesions are evident. There is cholelithiasis. The  gallbladder wall is diffusely thickened and edematous. There is loss of fat plane between the gallbladder and the pylorus/first portion of the duodenum. There is no air within the gallbladder. There is no appreciable intrahepatic biliary duct dilatation. There is dilatation of the distal common bile duct, measuring 1.2 cm. No biliary duct mass or calculus evident. Pancreas: No pancreatic mass or inflammatory focus evident. No pancreatic duct dilatation. Spleen: No splenic lesions are evident. Adrenals/Urinary Tract: Adrenals bilaterally appear normal. There are subcentimeter cysts in the right kidney. There is an extrarenal pelvis on the right, an anatomic variant. There is no hydronephrosis on either side. There is or ureteral calculus on either side. Urinary bladder wall thickness is within normal limits. Stomach/Bowel: As noted above, there is loss of fat plane between the gallbladder and the pylorus/first portion of the duodenum. There is wall thickening in the distal stomach and proximal duodenum. There is no other appreciable bowel wall thickening. The patient has had a partial right colectomy with anastomosis patent in this area. Terminal ileum appears unremarkable. No evident bowel obstruction. There is no free air or portal venous air. Vascular/Lymphatic: There is aortic and iliac artery atherosclerosis. No aneurysm evident. Major mesenteric arterial vessels appear patent, although there is severe narrowing at the origin of the right renal artery and extensive calcification with severe narrowing in the proximal left renal artery. No adenopathy is evident in the abdomen or pelvis. Reproductive: There are calcifications in the uterus, felt to be indicative of leiomyomatous change. The uterus is slightly canted  toward the right. No pelvic mass is demonstrable. Other: Appendix absent. There is no abscess or ascites evident in the abdomen or pelvis. There is fat in each inguinal ring. Musculoskeletal: There are  no blastic or lytic bone lesions. There is bony hypertrophy at L4 and diffuse disc protrusion. These findings in concert leads spinal stenosis. Similar changes are noted at L3-4 with spinal stenosis also present at L3-4. No blastic or lytic bone lesions. No intramuscular lesions are evident. IMPRESSION: 1. There is cholelithiasis with diffuse gallbladder wall thickening and edema. There is loss of fat plane between the gallbladder and the distal stomach/proximal duodenum. 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 No air seen in the gallbladder. 2. There is dilatation of the common hepatic and common bile ducts. No intrahepatic biliary duct dilatation seen. No biliary duct mass or calculus evident. A mass at the ampulla could cause these findings. From an imaging standpoint, MRCP would be the optimum study of choice to further evaluate in this regard. 3.  Hepatic steatosis.  No focal liver lesions appreciable. 4. No bowel obstruction. No abscess in the abdomen or pelvis. The patient is status post partial right colectomy with anastomosis patent. 5. Spinal stenosis at L3-4 and L4-5 due to disc protrusions and bony hypertrophy at these levels. 6. Extensive calcification and narrowing in the proximal renal arteries bilaterally. Question whether patient is hypertensive; there may well be renovascular hypertension given this appearance. 7.  Extensive aortic and iliac artery atherosclerosis. 8. Calcifications in the uterus consistent with leiomyomatous change. Electronically Signed   By: Lowella Grip III M.D.   On: 09/18/2018 14:26   Mr Abdomen Mrcp Wo Contrast  Result Date: 09/18/2018 CLINICAL DATA:  Abdominal pain/distension, Hemoccult-positive, history of breast cancer status post right mastectomy EXAM: MRI ABDOMEN WITHOUT CONTRAST  (INCLUDING MRCP) TECHNIQUE: Multiplanar multisequence MR imaging of the abdomen was performed. Heavily T2-weighted images of the biliary and pancreatic ducts  were obtained, and three-dimensional MRCP images were rendered by post processing. COMPARISON:  CT abdomen/pelvis dated 09/18/2018 FINDINGS: Motion degraded images. Lower chest: Lung bases are clear. Hepatobiliary: Suspected iron deposition in the liver. No focal hepatic lesion is seen. Cholelithiasis with gallbladder wall thickening/inflammatory changes. Associated 1.9 x 2.8 cm cystic lesion along the wall of the gallbladder (series 8/image 26) favors localized perforation. No intrahepatic or extrahepatic ductal dilatation. Pancreas:  Within normal limits. Spleen:  Within normal limits. Adrenals/Urinary Tract:  Adrenal glands are within normal limits. 8 mm posterior right upper pole renal cyst with mild right renal cortical scarring. Left kidney is within normal limits. No hydronephrosis. Stomach/Bowel: Stomach is within normal limits. Proximal duodenum is mildly thick-walled and adherent to the gallbladder (series 8/image 27). While direct fistulous communication is not demonstrated on MR, this may reflect involvement of the inflammatory process. Visualized bowel is otherwise unremarkable. Vascular/Lymphatic:  No evidence of abdominal aortic aneurysm. No suspicious abdominal lymphadenopathy. Other:  No abdominal ascites. Musculoskeletal: No focal osseous lesions. IMPRESSION: Cholelithiasis with acute cholecystitis. Associated 2.8 cm cystic lesion along the wall the gallbladder favors localized perforation. Adherent proximal duodenum with mild wall thickening suggest involvement with the right upper quadrant inflammatory process. Direct fistulous communication is not demonstrated on MR. Additional ancillary findings as above. Electronically Signed   By: Julian Hy M.D.   On: 09/18/2018 22:04   Dg Abdomen Acute W/chest  Result Date: 09/18/2018 CLINICAL DATA:  Acute generalized abdominal pain. EXAM: DG ABDOMEN ACUTE W/ 1V CHEST COMPARISON:  Radiographs of April 10, 2018 and February 27, 2015. FINDINGS: There  is no evidence of dilated bowel loops or free intraperitoneal air. No radiopaque calculi or other significant radiographic abnormality is seen. Stable cardiomediastinal silhouette is noted. Atherosclerosis of thoracic aorta is noted. Left internal jugular Port-A-Cath is noted in grossly good position. Both lungs are clear. IMPRESSION: No evidence of bowel obstruction or ileus. No acute cardiopulmonary disease. Aortic Atherosclerosis (ICD10-I70.0). Electronically Signed   By: Marijo Conception M.D.   On: 09/18/2018 13:24    Anti-infectives: Anti-infectives (From admission, onward)   Start     Dose/Rate Route Frequency Ordered Stop   09/19/18 1600  cefTRIAXone (ROCEPHIN) 2 g in sodium chloride 0.9 % 100 mL IVPB     2 g 200 mL/hr over 30 Minutes Intravenous Every 24 hours 09/18/18 1625     09/18/18 1600  metroNIDAZOLE (FLAGYL) IVPB 500 mg     500 mg 100 mL/hr over 60 Minutes Intravenous Every 8 hours 09/18/18 1516     09/18/18 1515  cefTRIAXone (ROCEPHIN) 2 g in sodium chloride 0.9 % 100 mL IVPB     2 g 200 mL/hr over 30 Minutes Intravenous  Once 09/18/18 1505 09/18/18 1650      Assessment/Plan: CAD H/O MI H/O CVA HTN DM Right sided DCIS, on oral chemo CKD Hemoccult + - defer to GI   Severe cholecystitis  MRI reviewed.  No clear CBD stone or mass/malignant appearing lesion.  T.bili is improved.  Will get a HIDA scan today.    She would be high risks for complications from a cholecystectomy for injury to the bowel or bile duct given the amount of inflammation. Depending on the HIDA results, would recommend IR place a percutaneous cholecystostomy tube if possible.  Continue NPO and IV antibiotics  LOS: 1 day    Coralie Keens 09/19/2018

## 2018-09-19 NOTE — Progress Notes (Signed)
PROGRESS NOTE    [name]  IYM:[id number] DOB: 1950-09-25 DOA: 09/18/2018 PCP: Tsosie Billing, MD (Inactive)   Brief Narrative: 68 year old with past medical history significant for right colectomy, breast cancer, diabetes type 2, GERD, asthma, coronary artery disease and multiple other medical problems presents with abdominal pain patient has been having abdominal pain on and off for months.  She presented with worsening abdominal pain and episode of vomiting. Evaluation in the ED: Alkaline phosphatase was 645, AST 215, ALT 325, bilirubin 5.5. CT abdomen pelvis:There is cholelithiasis with diffuse gallbladder wall thickening and edema. There is loss of fat plane between the gallbladder and the distal stomach/proximal duodenum. 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 No air seen in the gallbladder.There is dilatation of the common hepatic and common bile ducts. No intrahepatic biliary duct dilatation seen. No biliary duct mass or calculus evident. A mass at the ampulla could cause these findings. From an imaging standpoint, MRCP would be the optimum study of choice to further evaluate in this regard. MRCP; Cholelithiasis with acute cholecystitis. Associated 2.8 cm cystic lesion along the wall the gallbladder favors localized perforation. Adherent proximal duodenum with mild wall thickening suggest involvement with the right upper quadrant inflammatory process. Direct fistulous communication is not demonstrated on MR.  Additional ancillary findings as above.  Assessment & Plan:   Active Problems:   Elevated liver enzymes  1-Cholecystitis, transaminases, obstructive jaundice CT abdomen and pelvis with questionable cholecysto enteric fistula and cholecystitis. MRCP negative for fistula, did show cholelithiasis with acute cholecystitis and Associated 2.8 cm cystic lesion along the wall the gallbladder favors localized perforation.  Continue with IV  antibiotics. HIDA scan ordered per surgery, they recommend patient might need percutaneous drainage  Anemia: Positive occult blood: Follow hemoglobin.  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 Sonny Dandy. On letrozole and Herceptin.   HTN;  Hold oral medication.  Will order PRN hydralazine  Coronary artery disease a status post a stent 2013: Hold aspirin and Lipitor well n.p.o.  Chronic kidney disease a stage IV creatinine lower than recent baseline of 2.2. Start small amount of IV fluids due to being n.p.o.  Asthma continue with Symbicort and scheduled nebulizer GERD continue with PPI.    Estimated body mass index is 26.72 kg/m as calculated from the following:   Height as of this encounter: 4\' 11"  (1.499 m).   Weight as of this encounter: 60 kg.   DVT prophylaxis: SCDs Code Status: Full code Family Communication: Care discussed with patient Disposition Plan: Remain in the hospital for treatment of acute cholecystitis Consultants:   General surgery  GI  Procedures:   Right upper quadrant ultrasound  Antimicrobials:    Subjective: She still complaining of abdominal pain, nausea.  Objective: Vitals:   09/19/18 0505 09/19/18 0750 09/19/18 1225 09/19/18 1400  BP: (!) 174/76  135/76 112/71  Pulse: 78  80 75  Resp: 19  17 16   Temp: 98.7 F (37.1 C)   98.6 F (37 C)  TempSrc: Oral     SpO2: 96% 97% 97% 98%  Weight: 60 kg     Height:        Intake/Output Summary (Last 24 hours) at 09/19/2018 1427 Last data filed at 09/19/2018 1145 Gross per 24 hour  Intake 194.31 ml  Output 1500 ml  Net -1305.69 ml   Filed Weights   09/18/18 1130 09/19/18 0505  Weight: 58.1 kg 60  kg    Examination:  General exam: Appears calm and comfortable  Respiratory system: Clear to auscultation. Respiratory effort normal. Cardiovascular system: S1 & S2 heard, RRR. No JVD, murmurs, rubs, gallops  or clicks. No pedal edema. Gastrointestinal system: Abdomen is nondistended, soft and nontender. No organomegaly or masses felt. Normal bowel sounds heard. Central nervous system: Alert and oriented. No focal neurological deficits. Extremities: Symmetric 5 x 5 power. Skin: No rashes, lesions or ulcers Psychiatry: Judgement and insight appear normal. Mood & affect appropriate.     Data Reviewed: I have personally reviewed following labs and imaging studies  CBC: Recent Labs  Lab 09/18/18 1216 09/18/18 2310 09/19/18 0548  WBC 7.3  --  5.2  NEUTROABS 5.4  --   --   HGB 11.0* 10.5* 10.2*  HCT 35.1* 34.5* 33.2*  MCV 99.2  --  99.1  PLT 380  --  427   Basic Metabolic Panel: Recent Labs  Lab 09/18/18 1216 09/19/18 0548  NA 136 136  K 4.6 4.0  CL 100 102  CO2 24 24  GLUCOSE 179* 132*  BUN 25* 22  CREATININE 1.80* 1.66*  CALCIUM 10.5* 9.5   GFR: Estimated Creatinine Clearance: 24.8 mL/min (A) (by C-G formula based on SCr of 1.66 mg/dL (H)). Liver Function Tests: Recent Labs  Lab 09/18/18 1216 09/19/18 0548  AST 215* 134*  ALT 325* 243*  ALKPHOS 645* 577*  BILITOT 5.5* 3.0*  PROT 9.8* 8.3*  ALBUMIN 4.0 3.4*   Recent Labs  Lab 09/18/18 1216  LIPASE 44   No results for input(s): AMMONIA in the last 168 hours. Coagulation Profile: No results for input(s): INR, PROTIME in the last 168 hours. Cardiac Enzymes: No results for input(s): CKTOTAL, CKMB, CKMBINDEX, TROPONINI in the last 168 hours. BNP (last 3 results) No results for input(s): PROBNP in the last 8760 hours. HbA1C: No results for input(s): HGBA1C in the last 72 hours. CBG: Recent Labs  Lab 09/18/18 2235 09/19/18 0750  GLUCAP 112* 143*   Lipid Profile: No results for input(s): CHOL, HDL, LDLCALC, TRIG, CHOLHDL, LDLDIRECT in the last 72 hours. Thyroid Function Tests: No results for input(s): TSH, T4TOTAL, FREET4, T3FREE, THYROIDAB in the last 72 hours. Anemia Panel: No results for input(s):  VITAMINB12, FOLATE, FERRITIN, TIBC, IRON, RETICCTPCT in the last 72 hours. Sepsis Labs: No results for input(s): PROCALCITON, LATICACIDVEN in the last 168 hours.  Recent Results (from the past 240 hour(s))  SARS Coronavirus 2 [facility] order, Performed in [facility] hospital lab) Nasopharyngeal Nasopharyngeal Swab     Status: None   Collection Time: 09/18/18  1:22 PM   Specimen: Nasopharyngeal Swab  Result Value Ref Range Status   SARS Coronavirus 2 NEGATIVE NEGATIVE Final    Comment: (NOTE) If result is NEGATIVE SARS-CoV-2 target nucleic acids are NOT DETECTED. The SARS-CoV-2 RNA is generally detectable in upper and lower  respiratory specimens during the acute phase of infection. The lowest  concentration of SARS-CoV-2 viral copies this assay can detect is 250  copies / mL. A negative result does not preclude SARS-CoV-2 infection  and should not be used as the sole basis for treatment or other  patient management decisions.  A negative result may occur with  improper specimen collection / handling, submission of specimen other  than nasopharyngeal swab, presence of viral mutation(s) within the  areas targeted by this assay, and inadequate number of viral copies  (<250 copies / mL). A negative result must be combined with clinical  observations, patient  history, and epidemiological information. If result is POSITIVE SARS-CoV-2 target nucleic acids are DETECTED. The SARS-CoV-2 RNA is generally detectable in upper and lower  respiratory specimens dur ing the acute phase of infection.  Positive  results are indicative of active infection with SARS-CoV-2.  Clinical  correlation with patient history and other diagnostic information is  necessary to determine patient infection status.  Positive results do  not rule out bacterial infection or co-infection with other viruses. If result is PRESUMPTIVE POSTIVE SARS-CoV-2 nucleic acids MAY BE PRESENT.   A presumptive positive result was  obtained on the submitted specimen  and confirmed on repeat testing.  While 2019 novel coronavirus  (SARS-CoV-2) nucleic acids may be present in the submitted sample  additional confirmatory testing may be necessary for epidemiological  and / or clinical management purposes  to differentiate between  SARS-CoV-2 and other Sarbecovirus currently known to infect humans.  If clinically indicated additional testing with an alternate test  methodology [phone]) is advised. The SARS-CoV-2 RNA is generally  detectable in upper and lower respiratory sp ecimens during the acute  phase of infection. The expected result is Negative. Fact Sheet for Patients:  StrictlyIdeas.no Fact Sheet for Healthcare Providers: BankingDealers.co.za This test is not yet approved or cleared by the [place] FDA and has been authorized for detection and/or diagnosis of SARS-CoV-2 by FDA under an Emergency Use Authorization (EUA).  This EUA will remain in effect (meaning this test can be used) for the duration of the COVID-19 declaration under Section 564(b)(1) of the Act, 21 U.S.C. section 360bbb-3(b)(1), unless the authorization is terminated or revoked sooner. Performed at [facility], [place] [address], [place], [place] [address]          Radiology Studies: Ct Abdomen Pelvis W Contrast  Result Date: 09/18/2018 CLINICAL DATA:  Abdominal pain and distension. History of breast carcinoma EXAM: CT ABDOMEN AND PELVIS WITH CONTRAST TECHNIQUE: Multidetector CT imaging of the abdomen and pelvis was performed using the standard protocol following bolus administration of intravenous contrast. CONTRAST:  25mL OMNIPAQUE IOHEXOL 300 MG/ML  SOLN COMPARISON:  December 18, 2017 FINDINGS: Lower chest: There is bibasilar atelectasis. There is mild bronchiectatic change in the lung bases as well. There are foci of coronary artery calcification. Hepatobiliary: There  is hepatic steatosis. No focal liver lesions are evident. There is cholelithiasis. The gallbladder wall is diffusely thickened and edematous. There is loss of fat plane between the gallbladder and the pylorus/first portion of the duodenum. There is no air within the gallbladder. There is no appreciable intrahepatic biliary duct dilatation. There is dilatation of the distal common bile duct, measuring 1.2 cm. No biliary duct mass or calculus evident. Pancreas: No pancreatic mass or inflammatory focus evident. No pancreatic duct dilatation. Spleen: No splenic lesions are evident. Adrenals/Urinary Tract: Adrenals bilaterally appear normal. There are subcentimeter cysts in the right kidney. There is an extrarenal pelvis on the right, an anatomic variant. There is no hydronephrosis on either side. There is or ureteral calculus on either side. Urinary bladder wall thickness is within normal limits. Stomach/Bowel: As noted above, there is loss of fat plane between the gallbladder and the pylorus/first portion of the duodenum. There is wall thickening in the distal stomach and proximal duodenum. There is no other appreciable bowel wall thickening. The patient has had a partial right colectomy with anastomosis patent in this area. Terminal ileum appears unremarkable. No evident bowel obstruction. There is no free air or portal venous air. Vascular/Lymphatic: There is  aortic and iliac artery atherosclerosis. No aneurysm evident. Major mesenteric arterial vessels appear patent, although there is severe narrowing at the origin of the right renal artery and extensive calcification with severe narrowing in the proximal left renal artery. No adenopathy is evident in the abdomen or pelvis. Reproductive: There are calcifications in the uterus, felt to be indicative of leiomyomatous change. The uterus is slightly canted toward the right. No pelvic mass is demonstrable. Other: Appendix absent. There is no abscess or ascites evident in  the abdomen or pelvis. There is fat in each inguinal ring. Musculoskeletal: There are no blastic or lytic bone lesions. There is bony hypertrophy at L4 and diffuse disc protrusion. These findings in concert leads spinal stenosis. Similar changes are noted at L3-4 with spinal stenosis also present at L3-4. No blastic or lytic bone lesions. No intramuscular lesions are evident. IMPRESSION: 1. There is cholelithiasis with diffuse gallbladder wall thickening and edema. There is loss of fat plane between the gallbladder and the distal stomach/proximal duodenum. 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 No air seen in the gallbladder. 2. There is dilatation of the common hepatic and common bile ducts. No intrahepatic biliary duct dilatation seen. No biliary duct mass or calculus evident. A mass at the ampulla could cause these findings. From an imaging standpoint, MRCP would be the optimum study of choice to further evaluate in this regard. 3.  Hepatic steatosis.  No focal liver lesions appreciable. 4. No bowel obstruction. No abscess in the abdomen or pelvis. The patient is status post partial right colectomy with anastomosis patent. 5. Spinal stenosis at L3-4 and L4-5 due to disc protrusions and bony hypertrophy at these levels. 6. Extensive calcification and narrowing in the proximal renal arteries bilaterally. Question whether patient is hypertensive; there may well be renovascular hypertension given this appearance. 7.  Extensive aortic and iliac artery atherosclerosis. 8. Calcifications in the uterus consistent with leiomyomatous change. Electronically Signed   By: Lowella Grip III M.D.   On: 09/18/2018 14:26   Mr Abdomen Mrcp Wo Contrast  Result Date: 09/18/2018 CLINICAL DATA:  Abdominal pain/distension, Hemoccult-positive, history of breast cancer status post right mastectomy EXAM: MRI ABDOMEN WITHOUT CONTRAST  (INCLUDING MRCP) TECHNIQUE: Multiplanar multisequence MR imaging of the  abdomen was performed. Heavily T2-weighted images of the biliary and pancreatic ducts were obtained, and three-dimensional MRCP images were rendered by post processing. COMPARISON:  CT abdomen/pelvis dated 09/18/2018 FINDINGS: Motion degraded images. Lower chest: Lung bases are clear. Hepatobiliary: Suspected iron deposition in the liver. No focal hepatic lesion is seen. Cholelithiasis with gallbladder wall thickening/inflammatory changes. Associated 1.9 x 2.8 cm cystic lesion along the wall of the gallbladder (series 8/image 26) favors localized perforation. No intrahepatic or extrahepatic ductal dilatation. Pancreas:  Within normal limits. Spleen:  Within normal limits. Adrenals/Urinary Tract:  Adrenal glands are within normal limits. 8 mm posterior right upper pole renal cyst with mild right renal cortical scarring. Left kidney is within normal limits. No hydronephrosis. Stomach/Bowel: Stomach is within normal limits. Proximal duodenum is mildly thick-walled and adherent to the gallbladder (series 8/image 27). While direct fistulous communication is not demonstrated on MR, this may reflect involvement of the inflammatory process. Visualized bowel is otherwise unremarkable. Vascular/Lymphatic:  No evidence of abdominal aortic aneurysm. No suspicious abdominal lymphadenopathy. Other:  No abdominal ascites. Musculoskeletal: No focal osseous lesions. IMPRESSION: Cholelithiasis with acute cholecystitis. Associated 2.8 cm cystic lesion along the wall the gallbladder favors localized perforation. Adherent proximal duodenum with mild wall thickening suggest involvement  with the right upper quadrant inflammatory process. Direct fistulous communication is not demonstrated on MR. Additional ancillary findings as above. Electronically Signed   By: Julian Hy M.D.   On: 09/18/2018 22:04   Dg Abdomen Acute W/chest  Result Date: 09/18/2018 CLINICAL DATA:  Acute generalized abdominal pain. EXAM: DG ABDOMEN ACUTE W/ 1V  CHEST COMPARISON:  Radiographs of April 10, 2018 and February 27, 2015. FINDINGS: There is no evidence of dilated bowel loops or free intraperitoneal air. No radiopaque calculi or other significant radiographic abnormality is seen. Stable cardiomediastinal silhouette is noted. Atherosclerosis of thoracic aorta is noted. Left internal jugular Port-A-Cath is noted in grossly good position. Both lungs are clear. IMPRESSION: No evidence of bowel obstruction or ileus. No acute cardiopulmonary disease. Aortic Atherosclerosis (ICD10-I70.0). Electronically Signed   By: Marijo Conception M.D.   On: 09/18/2018 13:24        Scheduled Meds: . insulin aspart  0-5 Units Subcutaneous QHS  . insulin aspart  0-9 Units Subcutaneous TID WC  . metoprolol succinate  50 mg Oral QHS  . mometasone-formoterol  2 puff Inhalation BID  . morphine      . pantoprazole (PROTONIX) IV  40 mg Intravenous Q12H   Continuous Infusions: . sodium chloride 75 mL/hr at 09/19/18 1025  . cefTRIAXone (ROCEPHIN)  IV    . metronidazole 500 mg (09/19/18 0814)     LOS: 1 day    Time spent: 35 minutes.     Elmarie Shiley, MD [facility] Pager [phone]  If 7PM-7AM, please contact night-coverage [email or web address] Password [facility] 09/19/2018, 2:27 PM

## 2018-09-19 NOTE — Progress Notes (Signed)
[facility] Progress Note  CC:  Elevated LFT's, rectal bleeding  Subjective: The patient is down radiology anticipating percutaneous drainage  IMPRESSION: Cholelithiasis with acute cholecystitis. Associated 2.8 cm cystic lesion along the wall the gallbladder favors localized perforation.  Adherent proximal duodenum with mild wall thickening suggest involvement with the right upper quadrant inflammatory process. Direct fistulous communication is not demonstrated on MR.  Additional ancillary findings as above.  Objective:  Vital signs in last 24 hours: Temp:  [97.8 F (36.6 C)-98.7 F (37.1 C)] 98.7 F (37.1 C) (08/14 0505) Pulse Rate:  [62-78] 78 (08/14 0505) Resp:  [13-24] 19 (08/14 0505) BP: (137-176)/(74-127) 174/76 (08/14 0505) SpO2:  [93 %-100 %] 97 % (08/14 0750) Weight:  [58.1 kg-60 kg] 60 kg (08/14 0505)   General:  Alert, Well-developed, in NAD Heart:  Regular rate and rhythm; no murmurs Pulm: Abdomen:  Soft, nontender and nondistended. Normal bowel sounds, without guarding, and without rebound.   Extremities:  Without edema. Neurologic:  Alert and  oriented x4;  grossly normal neurologically. Psych:  Alert and cooperative. Normal mood and affect.  Intake/Output from previous day: 08/13 0701 - 08/14 0700 In: 194.3 [IV Piggyback:194.3] Out: 700 [Urine:700]  Lab Results: Recent Labs    09/18/18 1216 09/18/18 2310 09/19/18 0548  WBC 7.3  --  5.2  HGB 11.0* 10.5* 10.2*  HCT 35.1* 34.5* 33.2*  PLT 380  --  317   BMET Recent Labs    09/18/18 1216 09/19/18 0548  NA 136 136  K 4.6 4.0  CL 100 102  CO2 24 24  GLUCOSE 179* 132*  BUN 25* 22  CREATININE 1.80* 1.66*  CALCIUM 10.5* 9.5   LFT Recent Labs    09/19/18 0548  PROT 8.3*  ALBUMIN 3.4*  AST 134*  ALT 243*  ALKPHOS 577*  BILITOT 3.0*   Ct Abdomen Pelvis W Contrast  Result Date: 09/18/2018 CLINICAL DATA:  Abdominal pain and distension. History of breast carcinoma  EXAM: CT ABDOMEN AND PELVIS WITH CONTRAST TECHNIQUE: Multidetector CT imaging of the abdomen and pelvis was performed using the standard protocol following bolus administration of intravenous contrast. CONTRAST:  72mL OMNIPAQUE IOHEXOL 300 MG/ML  SOLN COMPARISON:  December 18, 2017 FINDINGS: Lower chest: There is bibasilar atelectasis. There is mild bronchiectatic change in the lung bases as well. There are foci of coronary artery calcification. Hepatobiliary: There is hepatic steatosis. No focal liver lesions are evident. There is cholelithiasis. The gallbladder wall is diffusely thickened and edematous. There is loss of fat plane between the gallbladder and the pylorus/first portion of the duodenum. There is no air within the gallbladder. There is no appreciable intrahepatic biliary duct dilatation. There is dilatation of the distal common bile duct, measuring 1.2 cm. No biliary duct mass or calculus evident. Pancreas: No pancreatic mass or inflammatory focus evident. No pancreatic duct dilatation. Spleen: No splenic lesions are evident. Adrenals/Urinary Tract: Adrenals bilaterally appear normal. There are subcentimeter cysts in the right kidney. There is an extrarenal pelvis on the right, an anatomic variant. There is no hydronephrosis on either side. There is or ureteral calculus on either side. Urinary bladder wall thickness is within normal limits. Stomach/Bowel: As noted above, there is loss of fat plane between the gallbladder and the pylorus/first portion of the duodenum. There is wall thickening in the distal stomach and proximal duodenum. There is no other appreciable bowel wall thickening. The patient has had a partial right colectomy with anastomosis patent in this area. Terminal  ileum appears unremarkable. No evident bowel obstruction. There is no free air or portal venous air. Vascular/Lymphatic: There is aortic and iliac artery atherosclerosis. No aneurysm evident. Major mesenteric arterial vessels  appear patent, although there is severe narrowing at the origin of the right renal artery and extensive calcification with severe narrowing in the proximal left renal artery. No adenopathy is evident in the abdomen or pelvis. Reproductive: There are calcifications in the uterus, felt to be indicative of leiomyomatous change. The uterus is slightly canted toward the right. No pelvic mass is demonstrable. Other: Appendix absent. There is no abscess or ascites evident in the abdomen or pelvis. There is fat in each inguinal ring. Musculoskeletal: There are no blastic or lytic bone lesions. There is bony hypertrophy at L4 and diffuse disc protrusion. These findings in concert leads spinal stenosis. Similar changes are noted at L3-4 with spinal stenosis also present at L3-4. No blastic or lytic bone lesions. No intramuscular lesions are evident. IMPRESSION: 1. There is cholelithiasis with diffuse gallbladder wall thickening and edema. There is loss of fat plane between the gallbladder and the distal stomach/proximal duodenum. 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 No air seen in the gallbladder. 2. There is dilatation of the common hepatic and common bile ducts. No intrahepatic biliary duct dilatation seen. No biliary duct mass or calculus evident. A mass at the ampulla could cause these findings. From an imaging standpoint, MRCP would be the optimum study of choice to further evaluate in this regard. 3.  Hepatic steatosis.  No focal liver lesions appreciable. 4. No bowel obstruction. No abscess in the abdomen or pelvis. The patient is status post partial right colectomy with anastomosis patent. 5. Spinal stenosis at L3-4 and L4-5 due to disc protrusions and bony hypertrophy at these levels. 6. Extensive calcification and narrowing in the proximal renal arteries bilaterally. Question whether patient is hypertensive; there may well be renovascular hypertension given this appearance. 7.  Extensive  aortic and iliac artery atherosclerosis. 8. Calcifications in the uterus consistent with leiomyomatous change. Electronically Signed   By: Lowella Grip III M.D.   On: 09/18/2018 14:26   Mr Abdomen Mrcp Wo Contrast  Result Date: 09/18/2018 CLINICAL DATA:  Abdominal pain/distension, Hemoccult-positive, history of breast cancer status post right mastectomy EXAM: MRI ABDOMEN WITHOUT CONTRAST  (INCLUDING MRCP) TECHNIQUE: Multiplanar multisequence MR imaging of the abdomen was performed. Heavily T2-weighted images of the biliary and pancreatic ducts were obtained, and three-dimensional MRCP images were rendered by post processing. COMPARISON:  CT abdomen/pelvis dated 09/18/2018 FINDINGS: Motion degraded images. Lower chest: Lung bases are clear. Hepatobiliary: Suspected iron deposition in the liver. No focal hepatic lesion is seen. Cholelithiasis with gallbladder wall thickening/inflammatory changes. Associated 1.9 x 2.8 cm cystic lesion along the wall of the gallbladder (series 8/image 26) favors localized perforation. No intrahepatic or extrahepatic ductal dilatation. Pancreas:  Within normal limits. Spleen:  Within normal limits. Adrenals/Urinary Tract:  Adrenal glands are within normal limits. 8 mm posterior right upper pole renal cyst with mild right renal cortical scarring. Left kidney is within normal limits. No hydronephrosis. Stomach/Bowel: Stomach is within normal limits. Proximal duodenum is mildly thick-walled and adherent to the gallbladder (series 8/image 27). While direct fistulous communication is not demonstrated on MR, this may reflect involvement of the inflammatory process. Visualized bowel is otherwise unremarkable. Vascular/Lymphatic:  No evidence of abdominal aortic aneurysm. No suspicious abdominal lymphadenopathy. Other:  No abdominal ascites. Musculoskeletal: No focal osseous lesions. IMPRESSION: Cholelithiasis with acute cholecystitis. Associated 2.8 cm  cystic lesion along the wall the  gallbladder favors localized perforation. Adherent proximal duodenum with mild wall thickening suggest involvement with the right upper quadrant inflammatory process. Direct fistulous communication is not demonstrated on MR. Additional ancillary findings as above. Electronically Signed   By: Julian Hy M.D.   On: 09/18/2018 22:04   Dg Abdomen Acute W/chest  Result Date: 09/18/2018 CLINICAL DATA:  Acute generalized abdominal pain. EXAM: DG ABDOMEN ACUTE W/ 1V CHEST COMPARISON:  Radiographs of April 10, 2018 and February 27, 2015. FINDINGS: There is no evidence of dilated bowel loops or free intraperitoneal air. No radiopaque calculi or other significant radiographic abnormality is seen. Stable cardiomediastinal silhouette is noted. Atherosclerosis of thoracic aorta is noted. Left internal jugular Port-A-Cath is noted in grossly good position. Both lungs are clear. IMPRESSION: No evidence of bowel obstruction or ileus. No acute cardiopulmonary disease. Aortic Atherosclerosis (ICD10-I70.0). Electronically Signed   By: Marijo Conception M.D.   On: 09/18/2018 13:24   Assessment / Plan: *Elevated LFT's in the setting of abdominal pain that has been present for quite some time but acutely worsened.  CT scan shows cholelithiasis with wall thickening and edema.  ? cholecysto-enteric fistula.  CBD dilated, but no definite stone seen.  MRCP has already been ordered. *GI bleed/heme positive stools:  Just began last evening after drinking lactulose.  Hgb 11.0 grams, stable as compared to levels over the past 2 months.   *Previous colon resection (right hemicolectomy) for perforated colon during polypectomy in 2009 *Chronic anemia:  MCV is normal.  Was evaluated by our service in 2013/2014 for anemia and heme positive stools with EGD, colonoscopy, wireless capsule endoscopy as stated above.  *Await MRCP (reviewed by Dr. Henrene Pastor see below).    No indication for ERCP *Trend LFT's. *Abx coverage. *Surgery to decide  on cholecystectomy versus cholecystostomy tube *Monitor Hgb and rectal bleeding for now.    LOS: 1 day   Laban Emperor. Zehr  09/19/2018, 9:17 AM  GI ATTENDING  Medical history data reviewed.  I reviewed the imaging studies including the MRI/MRCP.  Patient has acute cholecystitis which looks vigorous.  Clinically stable.  No evidence for choledocholithiasis.  Surgery to decide on best management option.  GI will sign off.  Docia Chuck. Geri Seminole., M.D. [facility] Division of Gastroenterology

## 2018-09-19 NOTE — Consult Note (Signed)
Chief Complaint: Patient was seen in consultation today for image guided drainage of gallbladder/fluid collection Chief Complaint  Patient presents with  . GI Problem    Referring Physician(s): Blackman,D  Supervising Physician: Markus Daft  Patient Status: [facility] - In-pt  History of Present Illness: [name] is a 68 y.o. female with past medical history significant for asthma, right breast DCIS with right mastectomy/left Port-A-Cath placement, partial right colectomy post polypectomy colonic perforation ,CKD, coronary artery disease with prior MI and stenting, diabetes, fatty liver disease, GERD, hyperlipidemia, and hypertension who recently presented to [facility] with persistent right upper quadrant/epigastric pain as well as intermittent nausea and vomiting.  She was noted to have elevated liver function tests and imaging findings consistent with cholelithiasis with diffuse gallbladder wall thickening and edema, dilatation of the common hepatic and common bile ducts,?  cystic lesion along wall of gallbladder and and nonvisualization of the gallbladder on nuclear medicine hepatobiliary scan.  She is currently afebrile with normal WBC, hemoglobin 10.2, platelets 317k, creatinine 1.66, PT/INR/AFP/CA-19-9 pending.  Request received from surgery for image guided drainage of the gallbladder/ associated fluid collection.  Past Medical History:  Diagnosis Date  . Angina   . Arthritis    HANDS"  . Asthma   . Cancer ([place]) 09/04/2017   Right Breast  . Carpal tunnel syndrome, bilateral 11/26/2016  . Cholelithiasis   . Cocaine abuse ([place]) 07/2012   per [facility]. drug screen  . Coronary artery disease   . Diabetes mellitus without mention of complication    type 2  . Dysphagia    esophageal dysmotility on 03/2011 esophagram   . Dyspnea    ongoing   . Fatty liver disease, nonalcoholic 5956   on Imaging.   Marland Kitchen GERD (gastroesophageal reflux disease)   . Headache   . History  of colonic polyps 2009   adenomatous 2009, HP in 2009, 2011, 2013.   Marland Kitchen Hyperlipidemia   . Hypertension   . Iron deficiency anemia   . Myocardial infarction ([place])    years ago, 6 stents placed  . 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 ABDOMINAL ANGIOGRAM  02/20/2011   Procedure: ABDOMINAL ANGIOGRAM;  Surgeon: Clent Demark, MD;  Location: [facility] CATH LAB;  Service: Cardiovascular;;  . CARPAL TUNNEL RELEASE Right 07/04/2017   Procedure: RIGHT CARPAL TUNNEL RELEASE;  Surgeon: Leanora Cover, MD;  Location: [place];  Service: Orthopedics;  Laterality: Right;  . CARPAL TUNNEL RELEASE Left 09/12/2017   Procedure: LEFT CARPAL TUNNEL RELEASE;  Surgeon: Leanora Cover, MD;  Location: [place];  Service: Orthopedics;  Laterality: Left;  . COLONOSCOPY  11/30/2011   Procedure: COLONOSCOPY;  Surgeon: Lafayette Dragon, MD;  Location: [facility];  Service: Endoscopy;  Laterality: N/A;  . CORONARY ANGIOPLASTY WITH STENT PLACEMENT    . ESOPHAGOGASTRODUODENOSCOPY  11/30/2011   Procedure: ESOPHAGOGASTRODUODENOSCOPY (EGD);  Surgeon: Lafayette Dragon, MD;  Location: [facility];  Service: Endoscopy;  Laterality: N/A;  . ESOPHAGOGASTRODUODENOSCOPY N/A 02/23/2015   Procedure: ESOPHAGOGASTRODUODENOSCOPY (EGD);  Surgeon: Gatha Mayer, MD;  Location: [facility] ENDOSCOPY;  Service: Endoscopy;  Laterality: N/A;  . LEFT HEART CATHETERIZATION WITH CORONARY ANGIOGRAM N/A 02/20/2011   Procedure: LEFT HEART CATHETERIZATION WITH CORONARY ANGIOGRAM;  Surgeon: Clent Demark, MD;  Location: [place] CATH LAB;  Service: Cardiovascular;  Laterality: N/A;  . MASTECTOMY Right 11/05/2017  . MASTECTOMY W/ SENTINEL NODE BIOPSY Right 11/27/2017  .  MASTECTOMY W/ SENTINEL NODE BIOPSY Right 11/27/2017   Procedure: RIGHT TOTAL MASTECTOMY WITH SENTINEL LYMPH NODE BIOPSY;  Surgeon: Excell Seltzer, MD;  Location: [place];  Service: General;  Laterality: Right;  .  PORT A CATH REVISION Left 05/07/2018   Procedure: PORT A CATH REVISION;  Surgeon: Excell Seltzer, MD;  Location: [facility];  Service: General;  Laterality: Left;  . PORTACATH PLACEMENT Left 11/27/2017   Procedure: INSERTION PORT-A-CATH;  Surgeon: Excell Seltzer, MD;  Location: [place];  Service: General;  Laterality: Left;  . RIGHT COLECTOMY  02/2007   for post polypectomy colonic perforation  . TUBAL LIGATION      Allergies: Patient has no known allergies.  Medications: Prior to Admission medications   Medication Sig Start Date End Date Taking? Authorizing Provider  acetaminophen (TYLENOL) 325 MG tablet Take 2 tablets (650 mg total) by mouth every 4 (four) hours as needed for headache or mild pain. 07/14/18  Yes Charolette Forward, MD  albuterol (PROVENTIL) (2.5 MG/3ML) 0.083% nebulizer solution INHALE THE CONTENTS OF 1 VIAL USING NEBULIZER EVERY 6 HOURS AS NEEDEDFOR WHEEZING OR SHORTNESS OF BREATH Patient taking differently: Take 2.5 mg by nebulization every 6 (six) hours as needed for wheezing or shortness of breath.  08/07/18  Yes Nicholas Lose, MD  allopurinol (ZYLOPRIM) 100 MG tablet Take 100 mg by mouth daily. 09/17/18  Yes [provider]  ALPRAZolam (XANAX) 0.25 MG tablet Take 0.25 mg by mouth daily.    Yes [provider]  amLODipine (NORVASC) 5 MG tablet Take 5 mg by mouth daily.   Yes [provider]  anastrozole (ARIMIDEX) 1 MG tablet Take 1 tablet (1 mg total) by mouth daily. 02/27/18  Yes Nicholas Lose, MD  aspirin EC 81 MG EC tablet Take 1 tablet (81 mg total) by mouth daily. 02/06/12  Yes Patrecia Pour, NP  atorvastatin (LIPITOR) 20 MG tablet Take 20 mg by mouth daily.   Yes [provider]  budesonide-formoterol (SYMBICORT) 160-4.5 MCG/ACT inhaler Inhale 1 puff into the lungs 2 (two) times daily.    Yes [provider]  colchicine 0.6 MG tablet Take 0.6 mg by mouth daily as needed for pain. 07/17/18  Yes [provider]  feeding  supplement, ENSURE ENLIVE, (ENSURE ENLIVE) LIQD Take 237 mLs by mouth 2 (two) times daily between meals. 02/24/15  Yes Ghimire, Henreitta Leber, MD  lactulose (CHRONULAC) 10 GM/15ML solution Take 30 mLs by mouth 3 (three) times daily. 09/17/18  Yes [provider]  LANTUS SOLOSTAR 100 UNIT/ML Solostar Pen Inject 10 Units into the skin daily. 07/24/18  Yes [provider]  loperamide (IMODIUM) 2 MG capsule Take 1 capsule (2 mg total) by mouth 4 (four) times daily as needed for diarrhea or loose stools. 02/27/15  Yes Marella Chimes, PA-C  metoprolol succinate (TOPROL-XL) 50 MG 24 hr tablet Take 50 mg by mouth at bedtime. Take with or immediately following a meal.   Yes [provider]  Omega-3 Fatty Acids (FISH OIL) 1000 MG CAPS Take 1,000 mg by mouth daily.   Yes [provider]  ondansetron (ZOFRAN) 8 MG tablet Take 1 tablet (8 mg total) by mouth every 8 (eight) hours as needed for nausea or vomiting. 06/16/18  Yes Nicholas Lose, MD  pantoprazole (PROTONIX) 40 MG tablet Take 1 tablet (40 mg total) by mouth daily. 02/24/15  Yes Ghimire, Henreitta Leber, MD  traMADol (ULTRAM) 50 MG tablet Take 50-100 mg by mouth every 6 (six) hours as needed for  pain. 09/17/18  Yes [provider]  nitroGLYCERIN (NITROSTAT) 0.4 MG SL tablet Place 1 tablet (0.4 mg total) under the tongue every 5 (five) minutes as needed. For chest pain Patient taking differently: Place 0.4 mg under the tongue every 5 (five) minutes as needed for chest pain.  02/06/12   Patrecia Pour, NP     Family History  Problem Relation Age of Onset  . Heart disease Mother   . Diabetes Mother   . Cancer Father        unsure what kind  . Diabetes Sister   . Colon cancer Neg Hx   . Breast cancer Neg Hx     Social History   Socioeconomic History  . Marital status: Divorced    Spouse name: Not on file  . Number of children: 2  . Years of education: Not on file  . Highest education level: Not on file   Occupational History  . Occupation: retired. prev worked in housekeeping w/ [employer]  . Financial resource strain: Not on file  . Food insecurity    Worry: Not on file    Inability: Not on file  . Transportation needs    Medical: Yes    Non-medical: Yes  Tobacco Use  . Smoking status: Former Smoker    Packs/day: 0.50    Years: 47.00    Pack years: 23.50    Types: Cigarettes    Quit date: 05/09/2010    Years since quitting: 8.3  . Smokeless tobacco: Never Used  Substance and Sexual Activity  . Alcohol use: No  . Drug use: Not Currently    Types: Cocaine    Comment: positive drug screen (cocaine, benzo's)07/2012 in emergency room  . Sexual activity: Not Currently    Birth control/protection: Post-menopausal  Lifestyle  . Physical activity    Days per week: Not on file    Minutes per session: Not on file  . Stress: Not on file  Relationships  . Social Herbalist on phone: Not on file    Gets together: Not on file    Attends religious service: Not on file    Active member of club or organization: Not on file    Attends meetings of clubs or organizations: Not on file    Relationship status: Not on file  Other Topics Concern  . Not on file  Social History Narrative   Caffeine use daily.     Review of Systems currently denies fever, headache, chest pain, dyspnea, cough, or abnormal bleeding.  She does have some back pain and is anxious.  Vital Signs: BP 112/71 (BP Location: Left Arm)   Pulse 75   Temp 98.6 F (37 C)   Resp 16   Ht 4\' 11"  (1.499 m)   Wt 132 lb 4.4 oz (60 kg)   SpO2 98%   BMI 26.72 kg/m   Physical Exam awake, alert; chest clear to auscultation bilaterally.  Intact left chest wall Port-A-Cath.  Heart with regular rate/ rhythm.  Abdomen soft, positive bowel sounds, tender right upper quadrant and epigastric regions to palpation.  No lower extremity edema.  Imaging: Nm Hepatobiliary Liver Func  Result Date: 09/19/2018 CLINICAL  DATA:  Abdominal pain, nausea and vomiting. Elevated bilirubin level. EXAM: NUCLEAR MEDICINE HEPATOBILIARY IMAGING TECHNIQUE: Sequential images of the abdomen were obtained out to 60 minutes following intravenous administration of radiopharmaceutical. RADIOPHARMACEUTICALS:  7.1 mCi Tc-25m  Choletec IV COMPARISON:  MRI 09/18/2018 and CT AP  09/18/2018 FINDINGS: Prompt uptake and biliary excretion of activity by the liver is seen. Biliary activity passes into small bowel, consistent with patent common bile duct. After 1 hour of imaging no gallbladder activity identified. The patient was then given 2.4 mg of morphine sulfate, IV. Imaging was then subsequently performed for an additional 30 minutes without visualization of the gallbladder. IMPRESSION: 1. Nonvisualization of the gallbladder compatible with cystic duct obstruction which may be secondary to acute cholecystitis. 2. Patent common bile duct. Electronically Signed   By: Kerby Moors M.D.   On: 09/19/2018 15:25   Ct Abdomen Pelvis W Contrast  Result Date: 09/18/2018 CLINICAL DATA:  Abdominal pain and distension. History of breast carcinoma EXAM: CT ABDOMEN AND PELVIS WITH CONTRAST TECHNIQUE: Multidetector CT imaging of the abdomen and pelvis was performed using the standard protocol following bolus administration of intravenous contrast. CONTRAST:  53mL OMNIPAQUE IOHEXOL 300 MG/ML  SOLN COMPARISON:  December 18, 2017 FINDINGS: Lower chest: There is bibasilar atelectasis. There is mild bronchiectatic change in the lung bases as well. There are foci of coronary artery calcification. Hepatobiliary: There is hepatic steatosis. No focal liver lesions are evident. There is cholelithiasis. The gallbladder wall is diffusely thickened and edematous. There is loss of fat plane between the gallbladder and the pylorus/first portion of the duodenum. There is no air within the gallbladder. There is no appreciable intrahepatic biliary duct dilatation. There is dilatation  of the distal common bile duct, measuring 1.2 cm. No biliary duct mass or calculus evident. Pancreas: No pancreatic mass or inflammatory focus evident. No pancreatic duct dilatation. Spleen: No splenic lesions are evident. Adrenals/Urinary Tract: Adrenals bilaterally appear normal. There are subcentimeter cysts in the right kidney. There is an extrarenal pelvis on the right, an anatomic variant. There is no hydronephrosis on either side. There is or ureteral calculus on either side. Urinary bladder wall thickness is within normal limits. Stomach/Bowel: As noted above, there is loss of fat plane between the gallbladder and the pylorus/first portion of the duodenum. There is wall thickening in the distal stomach and proximal duodenum. There is no other appreciable bowel wall thickening. The patient has had a partial right colectomy with anastomosis patent in this area. Terminal ileum appears unremarkable. No evident bowel obstruction. There is no free air or portal venous air. Vascular/Lymphatic: There is aortic and iliac artery atherosclerosis. No aneurysm evident. Major mesenteric arterial vessels appear patent, although there is severe narrowing at the origin of the right renal artery and extensive calcification with severe narrowing in the proximal left renal artery. No adenopathy is evident in the abdomen or pelvis. Reproductive: There are calcifications in the uterus, felt to be indicative of leiomyomatous change. The uterus is slightly canted toward the right. No pelvic mass is demonstrable. Other: Appendix absent. There is no abscess or ascites evident in the abdomen or pelvis. There is fat in each inguinal ring. Musculoskeletal: There are no blastic or lytic bone lesions. There is bony hypertrophy at L4 and diffuse disc protrusion. These findings in concert leads spinal stenosis. Similar changes are noted at L3-4 with spinal stenosis also present at L3-4. No blastic or lytic bone lesions. No intramuscular  lesions are evident. IMPRESSION: 1. There is cholelithiasis with diffuse gallbladder wall thickening and edema. There is loss of fat plane between the gallbladder and the distal stomach/proximal duodenum. 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 No air seen in the gallbladder. 2. There is dilatation of the common hepatic and common bile ducts. No intrahepatic  biliary duct dilatation seen. No biliary duct mass or calculus evident. A mass at the ampulla could cause these findings. From an imaging standpoint, MRCP would be the optimum study of choice to further evaluate in this regard. 3.  Hepatic steatosis.  No focal liver lesions appreciable. 4. No bowel obstruction. No abscess in the abdomen or pelvis. The patient is status post partial right colectomy with anastomosis patent. 5. Spinal stenosis at L3-4 and L4-5 due to disc protrusions and bony hypertrophy at these levels. 6. Extensive calcification and narrowing in the proximal renal arteries bilaterally. Question whether patient is hypertensive; there may well be renovascular hypertension given this appearance. 7.  Extensive aortic and iliac artery atherosclerosis. 8. Calcifications in the uterus consistent with leiomyomatous change. Electronically Signed   By: Lowella Grip III M.D.   On: 09/18/2018 14:26   Mr Abdomen Mrcp Wo Contrast  Result Date: 09/18/2018 CLINICAL DATA:  Abdominal pain/distension, Hemoccult-positive, history of breast cancer status post right mastectomy EXAM: MRI ABDOMEN WITHOUT CONTRAST  (INCLUDING MRCP) TECHNIQUE: Multiplanar multisequence MR imaging of the abdomen was performed. Heavily T2-weighted images of the biliary and pancreatic ducts were obtained, and three-dimensional MRCP images were rendered by post processing. COMPARISON:  CT abdomen/pelvis dated 09/18/2018 FINDINGS: Motion degraded images. Lower chest: Lung bases are clear. Hepatobiliary: Suspected iron deposition in the liver. No focal hepatic  lesion is seen. Cholelithiasis with gallbladder wall thickening/inflammatory changes. Associated 1.9 x 2.8 cm cystic lesion along the wall of the gallbladder (series 8/image 26) favors localized perforation. No intrahepatic or extrahepatic ductal dilatation. Pancreas:  Within normal limits. Spleen:  Within normal limits. Adrenals/Urinary Tract:  Adrenal glands are within normal limits. 8 mm posterior right upper pole renal cyst with mild right renal cortical scarring. Left kidney is within normal limits. No hydronephrosis. Stomach/Bowel: Stomach is within normal limits. Proximal duodenum is mildly thick-walled and adherent to the gallbladder (series 8/image 27). While direct fistulous communication is not demonstrated on MR, this may reflect involvement of the inflammatory process. Visualized bowel is otherwise unremarkable. Vascular/Lymphatic:  No evidence of abdominal aortic aneurysm. No suspicious abdominal lymphadenopathy. Other:  No abdominal ascites. Musculoskeletal: No focal osseous lesions. IMPRESSION: Cholelithiasis with acute cholecystitis. Associated 2.8 cm cystic lesion along the wall the gallbladder favors localized perforation. Adherent proximal duodenum with mild wall thickening suggest involvement with the right upper quadrant inflammatory process. Direct fistulous communication is not demonstrated on MR. Additional ancillary findings as above. Electronically Signed   By: Julian Hy M.D.   On: 09/18/2018 22:04   Dg Abdomen Acute W/chest  Result Date: 09/18/2018 CLINICAL DATA:  Acute generalized abdominal pain. EXAM: DG ABDOMEN ACUTE W/ 1V CHEST COMPARISON:  Radiographs of April 10, 2018 and February 27, 2015. FINDINGS: There is no evidence of dilated bowel loops or free intraperitoneal air. No radiopaque calculi or other significant radiographic abnormality is seen. Stable cardiomediastinal silhouette is noted. Atherosclerosis of thoracic aorta is noted. Left internal jugular Port-A-Cath is  noted in grossly good position. Both lungs are clear. IMPRESSION: No evidence of bowel obstruction or ileus. No acute cardiopulmonary disease. Aortic Atherosclerosis (ICD10-I70.0). Electronically Signed   By: Marijo Conception M.D.   On: 09/18/2018 13:24    Labs:  CBC: Recent Labs    07/13/18 0427 08/14/18 1250 09/18/18 1216 09/18/18 2310 09/19/18 0548  WBC 6.4 5.9 7.3  --  5.2  HGB 9.9* 11.4* 11.0* 10.5* 10.2*  HCT 32.6* 36.0 35.1* 34.5* 33.2*  PLT 278 280 380  --  317  COAGS: No results for input(s): INR, APTT in the last 8760 hours.  BMP: Recent Labs    07/13/18 0427 08/14/18 1250 09/18/18 1216 09/19/18 0548  NA 138 139 136 136  K 4.7 4.5 4.6 4.0  CL 107 106 100 102  CO2 25 25 24 24   GLUCOSE 297* 215* 179* 132*  BUN 26* 29* 25* 22  CALCIUM 8.9 9.5 10.5* 9.5  CREATININE 2.18* 2.27* 1.80* 1.66*  GFRNONAA 22* 21* 28* 31*  GFRAA 26* 25* 32* 36*    LIVER FUNCTION TESTS: Recent Labs    07/12/18 1030 08/14/18 1250 09/18/18 1216 09/19/18 0548  BILITOT 0.5 0.4 5.5* 3.0*  AST 21 15 215* 134*  ALT 21 16 325* 243*  ALKPHOS 102 138* 645* 577*  PROT 8.0 8.5* 9.8* 8.3*  ALBUMIN 3.6 3.5 4.0 3.4*    TUMOR MARKERS: No results for input(s): AFPTM, CEA, CA199, CHROMGRNA in the last 8760 hours.  Assessment and Plan: 68 y.o. female with past medical history significant for asthma, right breast DCIS with right mastectomy/left Port-A-Cath placement, partial right colectomy post polypectomy colonic perforation ,CKD, coronary artery disease with prior MI and stenting, diabetes, fatty liver disease, GERD, hyperlipidemia, and hypertension who recently presented to [facility] with persistent right upper quadrant/epigastric pain as well as intermittent nausea and vomiting.  She was noted to have elevated liver function tests and imaging findings consistent with cholelithiasis with diffuse gallbladder wall thickening and edema, dilatation of the common hepatic and common  bile ducts,?  cystic lesion along wall of gallbladder and and nonvisualization of the gallbladder on nuclear medicine hepatobiliary scan.  She is currently afebrile with normal WBC, hemoglobin 10.2, platelets 317k, creatinine 1.66, PT/INR/AFP/CA-19-9 pending. She is COVID-19 negative.  Request received from surgery for image guided drainage of the gallbladder/ associated fluid collection.  Imaging studies have been reviewed by Dr. Anselm Pancoast.Risks and benefits discussed with the patient/sister, [name], including bleeding, infection, damage to adjacent structures, bowel perforation/fistula connection, and sepsis.  All of the patient's questions were answered, patient is agreeable to proceed. Consent signed and in chart.  Procedure tent planned for 8/15; patient scheduled for limited right upper quadrant ultrasound abdomen tonight for further evaluation   Thank you for this interesting consult.  I greatly enjoyed meeting [name] and look forward to participating in their care.  A copy of this report was sent to the requesting provider on this date.  Electronically Signed: D. Rowe Robert, PA-C 09/19/2018, 5:01 PM   I spent a total of 30 minutes    in face to face in clinical consultation, greater than 50% of which was counseling/coordinating care for image guided drainage of gallbladder/fluid collection

## 2018-09-20 LAB — CBC WITH DIFFERENTIAL/PLATELET
Abs Immature Granulocytes: 0.06 10*3/uL (ref 0.00–0.07)
Basophils Absolute: 0 10*3/uL (ref 0.0–0.1)
Basophils Relative: 1 %
Eosinophils Absolute: 0 10*3/uL (ref 0.0–0.5)
Eosinophils Relative: 1 %
HCT: 30.7 % — ABNORMAL LOW (ref 36.0–46.0)
Hemoglobin: 9.4 g/dL — ABNORMAL LOW (ref 12.0–15.0)
Immature Granulocytes: 1 %
Lymphocytes Relative: 24 %
Lymphs Abs: 1.4 10*3/uL (ref 0.7–4.0)
MCH: 30.3 pg (ref 26.0–34.0)
MCHC: 30.6 g/dL (ref 30.0–36.0)
MCV: 99 fL (ref 80.0–100.0)
Monocytes Absolute: 0.6 10*3/uL (ref 0.1–1.0)
Monocytes Relative: 11 %
Neutro Abs: 3.8 10*3/uL (ref 1.7–7.7)
Neutrophils Relative %: 62 %
Platelets: 323 10*3/uL (ref 150–400)
RBC: 3.1 MIL/uL — ABNORMAL LOW (ref 3.87–5.11)
RDW: 14.3 % (ref 11.5–15.5)
WBC: 6 10*3/uL (ref 4.0–10.5)
nRBC: 0 % (ref 0.0–0.2)

## 2018-09-20 LAB — GLUCOSE, CAPILLARY
Glucose-Capillary: 98 mg/dL (ref 70–99)
Glucose-Capillary: 238 mg/dL — ABNORMAL HIGH (ref 70–99)
Glucose-Capillary: 117 mg/dL — ABNORMAL HIGH (ref 70–99)
Glucose-Capillary: 99 mg/dL (ref 70–99)

## 2018-09-20 LAB — COMPREHENSIVE METABOLIC PANEL
ALT: 160 U/L — ABNORMAL HIGH (ref 0–44)
AST: 55 U/L — ABNORMAL HIGH (ref 15–41)
Albumin: 3.2 g/dL — ABNORMAL LOW (ref 3.5–5.0)
Alkaline Phosphatase: 510 U/L — ABNORMAL HIGH (ref 38–126)
Anion gap: 10 (ref 5–15)
BUN: 23 mg/dL (ref 8–23)
CO2: 23 mmol/L (ref 22–32)
Calcium: 9.1 mg/dL (ref 8.9–10.3)
Chloride: 106 mmol/L (ref 98–111)
Creatinine, Ser: 1.76 mg/dL — ABNORMAL HIGH (ref 0.44–1.00)
GFR calc Af Amer: 33 mL/min — ABNORMAL LOW (ref >60)
GFR calc non Af Amer: 29 mL/min — ABNORMAL LOW (ref >60)
Glucose, Bld: 93 mg/dL (ref 70–99)
Potassium: 3.7 mmol/L (ref 3.5–5.1)
Sodium: 139 mmol/L (ref 135–145)
Total Bilirubin: 1.5 mg/dL — ABNORMAL HIGH (ref 0.3–1.2)
Total Protein: 7.8 g/dL (ref 6.5–8.1)

## 2018-09-20 LAB — PROTIME-INR
INR: 1 (ref 0.8–1.2)
Prothrombin Time: 13.2 seconds (ref 11.4–15.2)

## 2018-09-20 LAB — HEMOGLOBIN AND HEMATOCRIT, BLOOD
HCT: 32.7 % — ABNORMAL LOW (ref 36.0–46.0)
Hemoglobin: 10 g/dL — ABNORMAL LOW (ref 12.0–15.0)

## 2018-09-20 LAB — CANCER ANTIGEN 19-9: CA 19-9: 44 U/mL — ABNORMAL HIGH (ref 0–35)

## 2018-09-20 LAB — AFP TUMOR MARKER: AFP, Serum, Tumor Marker: 7 ng/mL (ref 0.0–8.3)

## 2018-09-20 MED ORDER — ALPRAZOLAM 0.5 MG PO TABS
0.5000 mg | ORAL_TABLET | Freq: Once | ORAL | Status: AC
  Administered 2018-09-20: 13:00:00 0.5 mg via ORAL
  Filled 2018-09-20: qty 1

## 2018-09-20 MED ORDER — HYDROMORPHONE HCL 1 MG/ML IJ SOLN
0.5000 mg | INTRAMUSCULAR | Status: DC | PRN
  Administered 2018-09-20 – 2018-09-21 (×5): 0.5 mg via INTRAVENOUS
  Filled 2018-09-20 (×5): qty 0.5

## 2018-09-20 MED ORDER — INSULIN GLARGINE 100 UNIT/ML ~~LOC~~ SOLN
5.0000 [IU] | Freq: Every day | SUBCUTANEOUS | Status: DC
  Filled 2018-09-20: qty 0.05

## 2018-09-20 MED ORDER — ALPRAZOLAM 0.25 MG PO TABS
0.2500 mg | ORAL_TABLET | Freq: Two times a day (BID) | ORAL | Status: DC | PRN
  Administered 2018-09-21 – 2018-09-24 (×5): 0.25 mg via ORAL
  Filled 2018-09-20 (×5): qty 1

## 2018-09-20 NOTE — Progress Notes (Signed)
Subjective/Chief Complaint: She reports much less abdominal pain today hungry   Objective: Vital signs in last 24 hours: Temp:  [98.6 F (37 C)-99.4 F (37.4 C)] 98.6 F (37 C) (08/15 0433) Pulse Rate:  [75-81] 81 (08/15 0433) Resp:  [16-18] 17 (08/15 0433) BP: (112-135)/(71-79) 120/79 (08/15 0433) SpO2:  [97 %-100 %] 100 % (08/15 0433) Weight:  [59.2 kg] 59.2 kg (08/15 0433) Last BM Date: 09/11/18  Intake/Output from previous day: 08/14 0701 - 08/15 0700 In: 900 [I.V.:900] Out: 1401 [Urine:1400; Stool:1] Intake/Output this shift: No intake/output data recorded.  Exam: Awake and alert Abdomen soft, less tender in the RUQ  Lab Results:  Recent Labs    09/19/18 0548 09/19/18 1826 09/20/18 0354  WBC 5.2  --  6.0  HGB 10.2* 9.9* 9.4*  HCT 33.2* 31.6* 30.7*  PLT 317  --  323   BMET Recent Labs    09/19/18 0548 09/20/18 0354  NA 136 139  K 4.0 3.7  CL 102 106  CO2 24 23  GLUCOSE 132* 93  BUN 22 23  CREATININE 1.66* 1.76*  CALCIUM 9.5 9.1   PT/INR Recent Labs    09/20/18 0354  LABPROT 13.2  INR 1.0   ABG No results for input(s): PHART, HCO3 in the last 72 hours.  Invalid input(s): PCO2, PO2  Studies/Results: Nm Hepatobiliary Liver Func  Result Date: 09/19/2018 CLINICAL DATA:  Abdominal pain, nausea and vomiting. Elevated bilirubin level. EXAM: NUCLEAR MEDICINE HEPATOBILIARY IMAGING TECHNIQUE: Sequential images of the abdomen were obtained out to 60 minutes following intravenous administration of radiopharmaceutical. RADIOPHARMACEUTICALS:  7.1 mCi Tc-67m  Choletec IV COMPARISON:  MRI 09/18/2018 and CT AP 09/18/2018 FINDINGS: Prompt uptake and biliary excretion of activity by the liver is seen. Biliary activity passes into small bowel, consistent with patent common bile duct. After 1 hour of imaging no gallbladder activity identified. The patient was then given 2.4 mg of morphine sulfate, IV. Imaging was then subsequently performed for an additional  30 minutes without visualization of the gallbladder. IMPRESSION: 1. Nonvisualization of the gallbladder compatible with cystic duct obstruction which may be secondary to acute cholecystitis. 2. Patent common bile duct. Electronically Signed   By: Kerby Moors M.D.   On: 09/19/2018 15:25   Ct Abdomen Pelvis W Contrast  Result Date: 09/18/2018 CLINICAL DATA:  Abdominal pain and distension. History of breast carcinoma EXAM: CT ABDOMEN AND PELVIS WITH CONTRAST TECHNIQUE: Multidetector CT imaging of the abdomen and pelvis was performed using the standard protocol following bolus administration of intravenous contrast. CONTRAST:  60mL OMNIPAQUE IOHEXOL 300 MG/ML  SOLN COMPARISON:  December 18, 2017 FINDINGS: Lower chest: There is bibasilar atelectasis. There is mild bronchiectatic change in the lung bases as well. There are foci of coronary artery calcification. Hepatobiliary: There is hepatic steatosis. No focal liver lesions are evident. There is cholelithiasis. The gallbladder wall is diffusely thickened and edematous. There is loss of fat plane between the gallbladder and the pylorus/first portion of the duodenum. There is no air within the gallbladder. There is no appreciable intrahepatic biliary duct dilatation. There is dilatation of the distal common bile duct, measuring 1.2 cm. No biliary duct mass or calculus evident. Pancreas: No pancreatic mass or inflammatory focus evident. No pancreatic duct dilatation. Spleen: No splenic lesions are evident. Adrenals/Urinary Tract: Adrenals bilaterally appear normal. There are subcentimeter cysts in the right kidney. There is an extrarenal pelvis on the right, an anatomic variant. There is no hydronephrosis on either side. There is or ureteral  calculus on either side. Urinary bladder wall thickness is within normal limits. Stomach/Bowel: As noted above, there is loss of fat plane between the gallbladder and the pylorus/first portion of the duodenum. There is wall  thickening in the distal stomach and proximal duodenum. There is no other appreciable bowel wall thickening. The patient has had a partial right colectomy with anastomosis patent in this area. Terminal ileum appears unremarkable. No evident bowel obstruction. There is no free air or portal venous air. Vascular/Lymphatic: There is aortic and iliac artery atherosclerosis. No aneurysm evident. Major mesenteric arterial vessels appear patent, although there is severe narrowing at the origin of the right renal artery and extensive calcification with severe narrowing in the proximal left renal artery. No adenopathy is evident in the abdomen or pelvis. Reproductive: There are calcifications in the uterus, felt to be indicative of leiomyomatous change. The uterus is slightly canted toward the right. No pelvic mass is demonstrable. Other: Appendix absent. There is no abscess or ascites evident in the abdomen or pelvis. There is fat in each inguinal ring. Musculoskeletal: There are no blastic or lytic bone lesions. There is bony hypertrophy at L4 and diffuse disc protrusion. These findings in concert leads spinal stenosis. Similar changes are noted at L3-4 with spinal stenosis also present at L3-4. No blastic or lytic bone lesions. No intramuscular lesions are evident. IMPRESSION: 1. There is cholelithiasis with diffuse gallbladder wall thickening and edema. There is loss of fat plane between the gallbladder and the distal stomach/proximal duodenum. 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 No air seen in the gallbladder. 2. There is dilatation of the common hepatic and common bile ducts. No intrahepatic biliary duct dilatation seen. No biliary duct mass or calculus evident. A mass at the ampulla could cause these findings. From an imaging standpoint, MRCP would be the optimum study of choice to further evaluate in this regard. 3.  Hepatic steatosis.  No focal liver lesions appreciable. 4. No bowel  obstruction. No abscess in the abdomen or pelvis. The patient is status post partial right colectomy with anastomosis patent. 5. Spinal stenosis at L3-4 and L4-5 due to disc protrusions and bony hypertrophy at these levels. 6. Extensive calcification and narrowing in the proximal renal arteries bilaterally. Question whether patient is hypertensive; there may well be renovascular hypertension given this appearance. 7.  Extensive aortic and iliac artery atherosclerosis. 8. Calcifications in the uterus consistent with leiomyomatous change. Electronically Signed   By: Lowella Grip III M.D.   On: 09/18/2018 14:26   Mr Abdomen Mrcp Wo Contrast  Result Date: 09/18/2018 CLINICAL DATA:  Abdominal pain/distension, Hemoccult-positive, history of breast cancer status post right mastectomy EXAM: MRI ABDOMEN WITHOUT CONTRAST  (INCLUDING MRCP) TECHNIQUE: Multiplanar multisequence MR imaging of the abdomen was performed. Heavily T2-weighted images of the biliary and pancreatic ducts were obtained, and three-dimensional MRCP images were rendered by post processing. COMPARISON:  CT abdomen/pelvis dated 09/18/2018 FINDINGS: Motion degraded images. Lower chest: Lung bases are clear. Hepatobiliary: Suspected iron deposition in the liver. No focal hepatic lesion is seen. Cholelithiasis with gallbladder wall thickening/inflammatory changes. Associated 1.9 x 2.8 cm cystic lesion along the wall of the gallbladder (series 8/image 26) favors localized perforation. No intrahepatic or extrahepatic ductal dilatation. Pancreas:  Within normal limits. Spleen:  Within normal limits. Adrenals/Urinary Tract:  Adrenal glands are within normal limits. 8 mm posterior right upper pole renal cyst with mild right renal cortical scarring. Left kidney is within normal limits. No hydronephrosis. Stomach/Bowel: Stomach is within normal  limits. Proximal duodenum is mildly thick-walled and adherent to the gallbladder (series 8/image 27). While direct  fistulous communication is not demonstrated on MR, this may reflect involvement of the inflammatory process. Visualized bowel is otherwise unremarkable. Vascular/Lymphatic:  No evidence of abdominal aortic aneurysm. No suspicious abdominal lymphadenopathy. Other:  No abdominal ascites. Musculoskeletal: No focal osseous lesions. IMPRESSION: Cholelithiasis with acute cholecystitis. Associated 2.8 cm cystic lesion along the wall the gallbladder favors localized perforation. Adherent proximal duodenum with mild wall thickening suggest involvement with the right upper quadrant inflammatory process. Direct fistulous communication is not demonstrated on MR. Additional ancillary findings as above. Electronically Signed   By: Julian Hy M.D.   On: 09/18/2018 22:04   Dg Abdomen Acute W/chest  Result Date: 09/18/2018 CLINICAL DATA:  Acute generalized abdominal pain. EXAM: DG ABDOMEN ACUTE W/ 1V CHEST COMPARISON:  Radiographs of April 10, 2018 and February 27, 2015. FINDINGS: There is no evidence of dilated bowel loops or free intraperitoneal air. No radiopaque calculi or other significant radiographic abnormality is seen. Stable cardiomediastinal silhouette is noted. Atherosclerosis of thoracic aorta is noted. Left internal jugular Port-A-Cath is noted in grossly good position. Both lungs are clear. IMPRESSION: No evidence of bowel obstruction or ileus. No acute cardiopulmonary disease. Aortic Atherosclerosis (ICD10-I70.0). Electronically Signed   By: Marijo Conception M.D.   On: 09/18/2018 13:24   US Abdomen Limited Ruq  Result Date: 09/19/2018 CLINICAL DATA:  Cholecystitis EXAM: ULTRASOUND ABDOMEN LIMITED RIGHT UPPER QUADRANT COMPARISON:  MRI September 18, 2018, CT September 18, 2018. FINDINGS: Gallbladder: There is a large echogenic mass seen on the peripheral outer wall of the gallbladder with posterior shadowing, likely calcified mass as seen on prior MRI and CT exam. This measures 1.4 by 1.5 x 1.2 cm. There is  diffuse gallbladder wall thickening measuring 6 mm. No definite pericholecystic fluid is seen. No sonographic Percell Miller sign is seen. Common bile duct: Diameter: 4 mm. Liver: No focal lesion identified. Within normal limits in parenchymal echogenicity. Portal vein is patent on color Doppler imaging with normal direction of blood flow towards the liver. Other: None. IMPRESSION: 1. Large calcified mass seen on the peripheral outer wall of the gallbladder measuring 1.4 x 1.5 x 1.2 cm with diffuse thickened gallbladder wall. Findings suggestive of cholecystitis. 2. Normal appearing liver. 3. No extrahepatic biliary ductal dilatation. Electronically Signed   By: Prudencio Pair M.D.   On: 09/19/2018 18:11    Anti-infectives: Anti-infectives (From admission, onward)   Start     Dose/Rate Route Frequency Ordered Stop   09/19/18 1600  cefTRIAXone (ROCEPHIN) 2 g in sodium chloride 0.9 % 100 mL IVPB     2 g 200 mL/hr over 30 Minutes Intravenous Every 24 hours 09/18/18 1625     09/18/18 1600  metroNIDAZOLE (FLAGYL) IVPB 500 mg     500 mg 100 mL/hr over 60 Minutes Intravenous Every 8 hours 09/18/18 1516     09/18/18 1515  cefTRIAXone (ROCEPHIN) 2 g in sodium chloride 0.9 % 100 mL IVPB     2 g 200 mL/hr over 30 Minutes Intravenous  Once 09/18/18 1505 09/18/18 1650      Assessment/Plan: Severe cholecystitis with question GB fistula or perforation  She is improving. LFT's are continuing to decrease Still, given CT and MRI, this would currently be a difficult cholecystectomy with a much higher risk of CBD injury or bowel injury. Hopefully IR can place a perc chole tube.  If not possible, will continue conservative management with IV antibiotics.  LOS: 2 days    Coralie Keens 09/20/2018

## 2018-09-20 NOTE — Progress Notes (Addendum)
PROGRESS NOTE    [name]  ZOX:[id number] DOB: [birth year] DOA: 09/18/2018 PCP: Tsosie Billing, MD (Inactive)   Brief Narrative: 68 year old with past medical history significant for right colectomy, breast cancer, diabetes type 2, GERD, asthma, coronary artery disease and multiple other medical problems presents with abdominal pain patient has been having abdominal pain on and off for months.  She presented with worsening abdominal pain and episode of vomiting. Evaluation in the ED: Alkaline phosphatase was 645, AST 215, ALT 325, bilirubin 5.5. CT abdomen pelvis:There is cholelithiasis with diffuse gallbladder wall thickening and edema. There is loss of fat plane between the gallbladder and the distal stomach/proximal duodenum. 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 No air seen in the gallbladder.There is dilatation of the common hepatic and common bile ducts. No intrahepatic biliary duct dilatation seen. No biliary duct mass or calculus evident. A mass at the ampulla could cause these findings. From an imaging standpoint, MRCP would be the optimum study of choice to further evaluate in this regard. MRCP; Cholelithiasis with acute cholecystitis. Associated 2.8 cm cystic lesion along the wall the gallbladder favors localized perforation. Adherent proximal duodenum with mild wall thickening suggest involvement with the right upper quadrant inflammatory process. Direct fistulous communication is not demonstrated on MR.  Additional ancillary findings as above.  Assessment & Plan:   Active Problems:   Elevated liver enzymes  1-Cholecystitis, transaminases, obstructive jaundice CT abdomen and pelvis with questionable cholecysto enteric fistula and cholecystitis. MRCP negative for fistula, did show cholelithiasis with acute cholecystitis and Associated 2.8 cm cystic lesion along the wall the gallbladder favors localized perforation.  Continue with IV  antibiotics, ceftriaxone, flagyl.  Continue with dilaudid PRN HIDA scan positive for cholecystis  [place] with gallstone , contracted gallbladder. IR and surgery recommend against percutaneous drainage.  Plan to treat with IV antibiotics, started on clear diet.   Anemia: Positive occult blood: Follow hemoglobin.  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 Sonny Dandy. On letrozole and Herceptin.   HTN;  Hold oral medication.  Will order PRN hydralazine  Coronary artery disease a status post a stent 2013: Hold aspirin and Lipitor well n.p.o.  Chronic kidney disease a stage IV creatinine lower than recent baseline of 2.2. Stop fluids. Patient on clear diet.   Asthma; continue with Symbicort and scheduled nebulizer GERD continue with PPI.   DM; on SSI.    Estimated body mass index is 26.36 kg/m as calculated from the following:   Height as of this encounter: 4\' 11"  (1.499 m).   Weight as of this encounter: 59.2 kg.   DVT prophylaxis: SCDs Code Status: Full code Family Communication: Care discussed with patient Disposition Plan: Remain in the hospital for treatment of acute cholecystitis Consultants:   General surgery  GI  Procedures:   Right upper quadrant ultrasound  Antimicrobials:    Subjective: She denies abdominal pain.  No nausea or vomiting/   Objective: Vitals:   09/19/18 1225 09/19/18 1400 09/19/18 2057 09/20/18 0433  BP: 135/76 112/71 114/75 120/79  Pulse: 80 75 77 81  Resp: 17 16 18 17   Temp:  98.6 F (37 C) 99.4 F (37.4 C) 98.6 F (37 C)  TempSrc:   Oral Oral  SpO2: 97% 98% 99% 100%  Weight:    59.2 kg  Height:        Intake/Output Summary (Last 24 hours) at 09/20/2018 1045 Last data filed at  09/20/2018 1036 Gross per 24 hour  Intake 1140 ml  Output 1601 ml  Net -461 ml   Filed Weights   09/18/18 1130 09/19/18 0505 09/20/18 0433  Weight: 58.1 kg 60 kg 59.2 kg     Examination:  General exam: NAD Respiratory system: CTA Cardiovascular system: S 1, S 2 RRR Gastrointestinal system: BS present, soft, mild tenderness, nd Central nervous system: non focal.  Extremities: Symmetric power,  Skin: No rashes Psychiatry: Mood and affect appropriate   Data Reviewed: I have personally reviewed following labs and imaging studies  CBC: Recent Labs  Lab 09/18/18 1216 09/18/18 2310 09/19/18 0548 09/19/18 1826 09/20/18 0354 09/20/18 1003  WBC 7.3  --  5.2  --  6.0  --   NEUTROABS 5.4  --   --   --  3.8  --   HGB 11.0* 10.5* 10.2* 9.9* 9.4* 10.0*  HCT 35.1* 34.5* 33.2* 31.6* 30.7* 32.7*  MCV 99.2  --  99.1  --  99.0  --   PLT 380  --  317  --  323  --    Basic Metabolic Panel: Recent Labs  Lab 09/18/18 1216 09/19/18 0548 09/20/18 0354  NA 136 136 139  K 4.6 4.0 3.7  CL 100 102 106  CO2 24 24 23   GLUCOSE 179* 132* 93  BUN 25* 22 23  CREATININE 1.80* 1.66* 1.76*  CALCIUM 10.5* 9.5 9.1   GFR: Estimated Creatinine Clearance: 23.3 mL/min (A) (by C-G formula based on SCr of 1.76 mg/dL (H)). Liver Function Tests: Recent Labs  Lab 09/18/18 1216 09/19/18 0548 09/20/18 0354  AST 215* 134* 55*  ALT 325* 243* 160*  ALKPHOS 645* 577* 510*  BILITOT 5.5* 3.0* 1.5*  PROT 9.8* 8.3* 7.8  ALBUMIN 4.0 3.4* 3.2*   Recent Labs  Lab 09/18/18 1216  LIPASE 44   No results for input(s): AMMONIA in the last 168 hours. Coagulation Profile: Recent Labs  Lab 09/20/18 0354  INR 1.0   Cardiac Enzymes: No results for input(s): CKTOTAL, CKMB, CKMBINDEX, TROPONINI in the last 168 hours. BNP (last 3 results) No results for input(s): PROBNP in the last 8760 hours. HbA1C: No results for input(s): HGBA1C in the last 72 hours. CBG: Recent Labs  Lab 09/18/18 2235 09/19/18 0750 09/19/18 1612 09/19/18 2104 09/20/18 0730  GLUCAP 112* 143* 91 86 98   Lipid Profile: No results for input(s): CHOL, HDL, LDLCALC, TRIG, CHOLHDL, LDLDIRECT in the last  72 hours. Thyroid Function Tests: No results for input(s): TSH, T4TOTAL, FREET4, T3FREE, THYROIDAB in the last 72 hours. Anemia Panel: No results for input(s): VITAMINB12, FOLATE, FERRITIN, TIBC, IRON, RETICCTPCT in the last 72 hours. Sepsis Labs: No results for input(s): PROCALCITON, LATICACIDVEN in the last 168 hours.  Recent Results (from the past 240 hour(s))  SARS Coronavirus 2 [facility] order, Performed in [facility] hospital lab) Nasopharyngeal Nasopharyngeal Swab     Status: None   Collection Time: 09/18/18  1:22 PM   Specimen: Nasopharyngeal Swab  Result Value Ref Range Status   SARS Coronavirus 2 NEGATIVE NEGATIVE Final    Comment: (NOTE) If result is NEGATIVE SARS-CoV-2 target nucleic acids are NOT DETECTED. The SARS-CoV-2 RNA is generally detectable in upper and lower  respiratory specimens during the acute phase of infection. The lowest  concentration of SARS-CoV-2 viral copies this assay can detect is 250  copies / mL. A negative result does not preclude SARS-CoV-2 infection  and should not be used as the sole basis for treatment or  other  patient management decisions.  A negative result may occur with  improper specimen collection / handling, submission of specimen other  than nasopharyngeal swab, presence of viral mutation(s) within the  areas targeted by this assay, and inadequate number of viral copies  (<250 copies / mL). A negative result must be combined with clinical  observations, patient history, and epidemiological information. If result is POSITIVE SARS-CoV-2 target nucleic acids are DETECTED. The SARS-CoV-2 RNA is generally detectable in upper and lower  respiratory specimens dur ing the acute phase of infection.  Positive  results are indicative of active infection with SARS-CoV-2.  Clinical  correlation with patient history and other diagnostic information is  necessary to determine patient infection status.  Positive results do  not rule out  bacterial infection or co-infection with other viruses. If result is PRESUMPTIVE POSTIVE SARS-CoV-2 nucleic acids MAY BE PRESENT.   A presumptive positive result was obtained on the submitted specimen  and confirmed on repeat testing.  While 2019 novel coronavirus  (SARS-CoV-2) nucleic acids may be present in the submitted sample  additional confirmatory testing may be necessary for epidemiological  and / or clinical management purposes  to differentiate between  SARS-CoV-2 and other Sarbecovirus currently known to infect humans.  If clinically indicated additional testing with an alternate test  methodology [id number]) is advised. The SARS-CoV-2 RNA is generally  detectable in upper and lower respiratory sp ecimens during the acute  phase of infection. The expected result is Negative. Fact Sheet for Patients:  StrictlyIdeas.no Fact Sheet for Healthcare Providers: BankingDealers.co.za This test is not yet approved or cleared by the [place] FDA and has been authorized for detection and/or diagnosis of SARS-CoV-2 by FDA under an Emergency Use Authorization (EUA).  This EUA will remain in effect (meaning this test can be used) for the duration of the COVID-19 declaration under Section 564(b)(1) of the Act, 21 U.S.C. section 360bbb-3(b)(1), unless the authorization is terminated or revoked sooner. Performed at [facility], [place] [address] [place], [place] [address]          Radiology Studies: Nm Hepatobiliary Liver Func  Result Date: 09/19/2018 CLINICAL DATA:  Abdominal pain, nausea and vomiting. Elevated bilirubin level. EXAM: NUCLEAR MEDICINE HEPATOBILIARY IMAGING TECHNIQUE: Sequential images of the abdomen were obtained out to 60 minutes following intravenous administration of radiopharmaceutical. RADIOPHARMACEUTICALS:  7.1 mCi Tc-59m  Choletec IV COMPARISON:  MRI 09/18/2018 and CT AP 09/18/2018 FINDINGS: Prompt  uptake and biliary excretion of activity by the liver is seen. Biliary activity passes into small bowel, consistent with patent common bile duct. After 1 hour of imaging no gallbladder activity identified. The patient was then given 2.4 mg of morphine sulfate, IV. Imaging was then subsequently performed for an additional 30 minutes without visualization of the gallbladder. IMPRESSION: 1. Nonvisualization of the gallbladder compatible with cystic duct obstruction which may be secondary to acute cholecystitis. 2. Patent common bile duct. Electronically Signed   By: Kerby Moors M.D.   On: 09/19/2018 15:25   Ct Abdomen Pelvis W Contrast  Result Date: 09/18/2018 CLINICAL DATA:  Abdominal pain and distension. History of breast carcinoma EXAM: CT ABDOMEN AND PELVIS WITH CONTRAST TECHNIQUE: Multidetector CT imaging of the abdomen and pelvis was performed using the standard protocol following bolus administration of intravenous contrast. CONTRAST:  42mL OMNIPAQUE IOHEXOL 300 MG/ML  SOLN COMPARISON:  December 18, 2017 FINDINGS: Lower chest: There is bibasilar atelectasis. There is mild bronchiectatic change in the lung bases as well. There are  foci of coronary artery calcification. Hepatobiliary: There is hepatic steatosis. No focal liver lesions are evident. There is cholelithiasis. The gallbladder wall is diffusely thickened and edematous. There is loss of fat plane between the gallbladder and the pylorus/first portion of the duodenum. There is no air within the gallbladder. There is no appreciable intrahepatic biliary duct dilatation. There is dilatation of the distal common bile duct, measuring 1.2 cm. No biliary duct mass or calculus evident. Pancreas: No pancreatic mass or inflammatory focus evident. No pancreatic duct dilatation. Spleen: No splenic lesions are evident. Adrenals/Urinary Tract: Adrenals bilaterally appear normal. There are subcentimeter cysts in the right kidney. There is an extrarenal pelvis on  the right, an anatomic variant. There is no hydronephrosis on either side. There is or ureteral calculus on either side. Urinary bladder wall thickness is within normal limits. Stomach/Bowel: As noted above, there is loss of fat plane between the gallbladder and the pylorus/first portion of the duodenum. There is wall thickening in the distal stomach and proximal duodenum. There is no other appreciable bowel wall thickening. The patient has had a partial right colectomy with anastomosis patent in this area. Terminal ileum appears unremarkable. No evident bowel obstruction. There is no free air or portal venous air. Vascular/Lymphatic: There is aortic and iliac artery atherosclerosis. No aneurysm evident. Major mesenteric arterial vessels appear patent, although there is severe narrowing at the origin of the right renal artery and extensive calcification with severe narrowing in the proximal left renal artery. No adenopathy is evident in the abdomen or pelvis. Reproductive: There are calcifications in the uterus, felt to be indicative of leiomyomatous change. The uterus is slightly canted toward the right. No pelvic mass is demonstrable. Other: Appendix absent. There is no abscess or ascites evident in the abdomen or pelvis. There is fat in each inguinal ring. Musculoskeletal: There are no blastic or lytic bone lesions. There is bony hypertrophy at L4 and diffuse disc protrusion. These findings in concert leads spinal stenosis. Similar changes are noted at L3-4 with spinal stenosis also present at L3-4. No blastic or lytic bone lesions. No intramuscular lesions are evident. IMPRESSION: 1. There is cholelithiasis with diffuse gallbladder wall thickening and edema. There is loss of fat plane between the gallbladder and the distal stomach/proximal duodenum. 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 No air seen in the gallbladder. 2. There is dilatation of the common hepatic and common bile ducts. No  intrahepatic biliary duct dilatation seen. No biliary duct mass or calculus evident. A mass at the ampulla could cause these findings. From an imaging standpoint, MRCP would be the optimum study of choice to further evaluate in this regard. 3.  Hepatic steatosis.  No focal liver lesions appreciable. 4. No bowel obstruction. No abscess in the abdomen or pelvis. The patient is status post partial right colectomy with anastomosis patent. 5. Spinal stenosis at L3-4 and L4-5 due to disc protrusions and bony hypertrophy at these levels. 6. Extensive calcification and narrowing in the proximal renal arteries bilaterally. Question whether patient is hypertensive; there may well be renovascular hypertension given this appearance. 7.  Extensive aortic and iliac artery atherosclerosis. 8. Calcifications in the uterus consistent with leiomyomatous change. Electronically Signed   By: Lowella Grip III M.D.   On: 09/18/2018 14:26   Mr Abdomen Mrcp Wo Contrast  Result Date: 09/18/2018 CLINICAL DATA:  Abdominal pain/distension, Hemoccult-positive, history of breast cancer status post right mastectomy EXAM: MRI ABDOMEN WITHOUT CONTRAST  (INCLUDING MRCP) TECHNIQUE: Multiplanar multisequence MR imaging  of the abdomen was performed. Heavily T2-weighted images of the biliary and pancreatic ducts were obtained, and three-dimensional MRCP images were rendered by post processing. COMPARISON:  CT abdomen/pelvis dated 09/18/2018 FINDINGS: Motion degraded images. Lower chest: Lung bases are clear. Hepatobiliary: Suspected iron deposition in the liver. No focal hepatic lesion is seen. Cholelithiasis with gallbladder wall thickening/inflammatory changes. Associated 1.9 x 2.8 cm cystic lesion along the wall of the gallbladder (series 8/image 26) favors localized perforation. No intrahepatic or extrahepatic ductal dilatation. Pancreas:  Within normal limits. Spleen:  Within normal limits. Adrenals/Urinary Tract:  Adrenal glands are within  normal limits. 8 mm posterior right upper pole renal cyst with mild right renal cortical scarring. Left kidney is within normal limits. No hydronephrosis. Stomach/Bowel: Stomach is within normal limits. Proximal duodenum is mildly thick-walled and adherent to the gallbladder (series 8/image 27). While direct fistulous communication is not demonstrated on MR, this may reflect involvement of the inflammatory process. Visualized bowel is otherwise unremarkable. Vascular/Lymphatic:  No evidence of abdominal aortic aneurysm. No suspicious abdominal lymphadenopathy. Other:  No abdominal ascites. Musculoskeletal: No focal osseous lesions. IMPRESSION: Cholelithiasis with acute cholecystitis. Associated 2.8 cm cystic lesion along the wall the gallbladder favors localized perforation. Adherent proximal duodenum with mild wall thickening suggest involvement with the right upper quadrant inflammatory process. Direct fistulous communication is not demonstrated on MR. Additional ancillary findings as above. Electronically Signed   By: Julian Hy M.D.   On: 09/18/2018 22:04   Dg Abdomen Acute W/chest  Result Date: 09/18/2018 CLINICAL DATA:  Acute generalized abdominal pain. EXAM: DG ABDOMEN ACUTE W/ 1V CHEST COMPARISON:  Radiographs of April 10, 2018 and February 27, 2015. FINDINGS: There is no evidence of dilated bowel loops or free intraperitoneal air. No radiopaque calculi or other significant radiographic abnormality is seen. Stable cardiomediastinal silhouette is noted. Atherosclerosis of thoracic aorta is noted. Left internal jugular Port-A-Cath is noted in grossly good position. Both lungs are clear. IMPRESSION: No evidence of bowel obstruction or ileus. No acute cardiopulmonary disease. Aortic Atherosclerosis (ICD10-I70.0). Electronically Signed   By: Marijo Conception M.D.   On: 09/18/2018 13:24   US Abdomen Limited Ruq  Result Date: 09/19/2018 CLINICAL DATA:  Cholecystitis EXAM: ULTRASOUND ABDOMEN LIMITED  RIGHT UPPER QUADRANT COMPARISON:  MRI September 18, 2018, CT September 18, 2018. FINDINGS: Gallbladder: There is a large echogenic mass seen on the peripheral outer wall of the gallbladder with posterior shadowing, likely calcified mass as seen on prior MRI and CT exam. This measures 1.4 by 1.5 x 1.2 cm. There is diffuse gallbladder wall thickening measuring 6 mm. No definite pericholecystic fluid is seen. No sonographic Percell Miller sign is seen. Common bile duct: Diameter: 4 mm. Liver: No focal lesion identified. Within normal limits in parenchymal echogenicity. Portal vein is patent on color Doppler imaging with normal direction of blood flow towards the liver. Other: None. IMPRESSION: 1. Large calcified mass seen on the peripheral outer wall of the gallbladder measuring 1.4 x 1.5 x 1.2 cm with diffuse thickened gallbladder wall. Findings suggestive of cholecystitis. 2. Normal appearing liver. 3. No extrahepatic biliary ductal dilatation. Electronically Signed   By: Prudencio Pair M.D.   On: 09/19/2018 18:11        Scheduled Meds: . insulin aspart  0-5 Units Subcutaneous QHS  . insulin aspart  0-9 Units Subcutaneous TID WC  . mometasone-formoterol  2 puff Inhalation BID  . morphine      . pantoprazole (PROTONIX) IV  40 mg Intravenous Q12H  Continuous Infusions: . cefTRIAXone (ROCEPHIN)  IV 200 mL/hr at 09/19/18 1608  . metronidazole 500 mg (09/20/18 0829)     LOS: 2 days    Time spent: 35 minutes.     Elmarie Shiley, MD [facility] Pager [phone]  If 7PM-7AM, please contact night-coverage [email or web address] Password [facility] 09/20/2018, 10:45 AM

## 2018-09-20 NOTE — Progress Notes (Signed)
Patient ID: [name], female   DOB: 06-04-1950, 68 y.o.   MRN: [id number]   Discussed with IR.  The gallbladder is fairly contracted and has a large stone with minimally space for a perc chole tube. Given her clinical improvement, we will hold on the tube and continue conservative treatment with IV antibiotics. Will start clear liquids. I suspect she will eventually need a cholecystectomy but will hold off as long as possible for inflammatory process to improve. I discussed with [name] and she agrees with the plan

## 2018-09-21 LAB — BASIC METABOLIC PANEL
Anion gap: 11 (ref 5–15)
BUN: 17 mg/dL (ref 8–23)
CO2: 21 mmol/L — ABNORMAL LOW (ref 22–32)
Calcium: 9.2 mg/dL (ref 8.9–10.3)
Chloride: 107 mmol/L (ref 98–111)
Creatinine, Ser: 1.6 mg/dL — ABNORMAL HIGH (ref 0.44–1.00)
GFR calc Af Amer: 37 mL/min — ABNORMAL LOW (ref >60)
GFR calc non Af Amer: 32 mL/min — ABNORMAL LOW (ref >60)
Glucose, Bld: 128 mg/dL — ABNORMAL HIGH (ref 70–99)
Potassium: 3.8 mmol/L (ref 3.5–5.1)
Sodium: 139 mmol/L (ref 135–145)

## 2018-09-21 LAB — HEPATIC FUNCTION PANEL
ALT: 117 U/L — ABNORMAL HIGH (ref 0–44)
AST: 32 U/L (ref 15–41)
Albumin: 3.1 g/dL — ABNORMAL LOW (ref 3.5–5.0)
Alkaline Phosphatase: 420 U/L — ABNORMAL HIGH (ref 38–126)
Bilirubin, Direct: 0.4 mg/dL — ABNORMAL HIGH (ref 0.0–0.2)
Indirect Bilirubin: 0.6 mg/dL (ref 0.3–0.9)
Total Bilirubin: 1 mg/dL (ref 0.3–1.2)
Total Protein: 7.7 g/dL (ref 6.5–8.1)

## 2018-09-21 LAB — GLUCOSE, CAPILLARY
Glucose-Capillary: 129 mg/dL — ABNORMAL HIGH (ref 70–99)
Glucose-Capillary: 137 mg/dL — ABNORMAL HIGH (ref 70–99)
Glucose-Capillary: 101 mg/dL — ABNORMAL HIGH (ref 70–99)
Glucose-Capillary: 127 mg/dL — ABNORMAL HIGH (ref 70–99)

## 2018-09-21 LAB — CBC
HCT: 30.4 % — ABNORMAL LOW (ref 36.0–46.0)
Hemoglobin: 9.2 g/dL — ABNORMAL LOW (ref 12.0–15.0)
MCH: 30.3 pg (ref 26.0–34.0)
MCHC: 30.3 g/dL (ref 30.0–36.0)
MCV: 100 fL (ref 80.0–100.0)
Platelets: 301 10*3/uL (ref 150–400)
RBC: 3.04 MIL/uL — ABNORMAL LOW (ref 3.87–5.11)
RDW: 14.4 % (ref 11.5–15.5)
WBC: 6.4 10*3/uL (ref 4.0–10.5)
nRBC: 0 % (ref 0.0–0.2)

## 2018-09-21 MED ORDER — COLCHICINE 0.6 MG PO TABS
0.6000 mg | ORAL_TABLET | Freq: Every day | ORAL | Status: DC
  Administered 2018-09-21 – 2018-09-25 (×5): 0.6 mg via ORAL
  Filled 2018-09-21 (×5): qty 1

## 2018-09-21 MED ORDER — ALLOPURINOL 100 MG PO TABS
100.0000 mg | ORAL_TABLET | Freq: Every day | ORAL | Status: DC
  Administered 2018-09-21 – 2018-09-25 (×5): 100 mg via ORAL
  Filled 2018-09-21 (×5): qty 1

## 2018-09-21 MED ORDER — DIPHENHYDRAMINE HCL 25 MG PO CAPS
25.0000 mg | ORAL_CAPSULE | Freq: Three times a day (TID) | ORAL | Status: DC | PRN
  Filled 2018-09-21: qty 1

## 2018-09-21 NOTE — Evaluation (Signed)
Physical Therapy Evaluation Patient Details Name: [name] MRN: [id number] DOB: [birth year] Today's Date: 09/21/2018   History of Present Illness  68 year old with past medical history significant for right colectomy, breast cancer, diabetes type 2, GERD, asthma, coronary artry disease and multiple other medical problems. admiitted with  worsening abdominal pain and episode of vomiting-->elevated liver enzymes, cholelithiasis with acute cholecystitis  Clinical Impression  Pt admitted with above diagnosis.  Pt  Will benefit from continued PT in acute setting. Recommend HHPT at this time however if gout pain resolves pt  May not need f/u.  Pt currently with functional limitations due to the deficits listed below (see PT Problem List). Pt will benefit from skilled PT to increase their independence and safety with mobility to allow discharge to the venue listed below.       Follow Up Recommendations Home health PT    Equipment Recommendations  None recommended by PT    Recommendations for Other Services       Precautions / Restrictions Precautions Precautions: Fall Precaution Comments: R foot gout Restrictions Weight Bearing Restrictions: No      Mobility  Bed Mobility Overal bed mobility: Needs Assistance Bed Mobility: Supine to Sit;Sit to Supine     Supine to sit: Min assist Sit to supine: Min assist   General bed mobility comments: with RLE, incr time  Transfers Overall transfer level: Needs assistance Equipment used: Rolling walker (2 wheeled) Transfers: Sit to/from [facility] Sit to Stand: Min assist; Stand pivot transfers: ;Min assist        General transfer comment: cues for hand placement and safety, assist to balance, RW used for stand pivot d/t left foot pain.  Ambulation/Gait Ambulation/Gait assistance: Min Web designer (Feet): 50 Feet Assistive device: 4-wheeled walker Gait Pattern/deviations: Step-to  pattern;Decreased weight shift to left     General Gait Details: pt walking on lateral L foot d/t significant gout pain; pt requiring assist for balance and to maneuver RW, decr safety requiring multi-modal cues to keep both hands on walker while in room  Stairs            Wheelchair Mobility    Modified Rankin (Stroke Patients Only)       Balance Overall balance assessment: Needs assistance Sitting-balance support: No upper extremity supported;Feet supported Sitting balance-Leahy Scale: Good       Standing balance-Leahy Scale: Poor Standing balance comment: reliant on UE support                             Pertinent Vitals/Pain Pain Assessment: 0-10 Pain Score: 8  Pain Location: right foot (gout pain) Pain Descriptors / Indicators: Grimacing;Sore Pain Intervention(s): Limited activity within patient's tolerance;Monitored during session;Repositioned    Home Living Family/patient expects to be discharged to:: Private residence Living Arrangements: Children;Other relatives Available Help at Discharge: Available 24 hours/day Type of Home: House Home Access: Stairs to enter Entrance Stairs-Rails: Right Entrance Stairs-Number of Steps: 4 Home Layout: One level Home Equipment: Cane - single point;Walker - 4 wheels Additional Comments: pt lives with son and grandson, she states they assist as needed (previous notes state pt has aide 5x/wk)    Prior Function Level of Independence: Independent with assistive device(s)         Comments: amb with rollator     Hand Dominance        Extremity/Trunk Assessment   Upper Extremity Assessment Upper Extremity Assessment: Overall WFL for tasks  assessed    Lower Extremity Assessment Lower Extremity Assessment: RLE deficits/detail RLE Deficits / Details: foot and ankle limited by pain; knee and hip grossly [facility]       Communication      Cognition Arousal/Alertness: Awake/alert Behavior During Therapy:  WFL for tasks assessed/performed Overall Cognitive Status: Within Functional Limits for tasks assessed                                        General Comments      Exercises     Assessment/Plan    PT Assessment Patient needs continued PT services  PT Problem List Decreased strength;Decreased range of motion;Decreased activity tolerance;Decreased mobility;Pain;Decreased balance       PT Treatment Interventions DME instruction;Therapeutic activities;Gait training;Functional mobility training;Therapeutic exercise;Patient/family education;Balance training    PT Goals (Current goals can be found in the Care Plan section)  Acute Rehab PT Goals PT Goal Formulation: With patient Time For Goal Achievement: 10/05/18 Potential to Achieve Goals: Good    Frequency Min 3X/week   Barriers to discharge        Co-evaluation               AM-PAC PT "6 Clicks" Mobility  Outcome Measure Help needed turning from your back to your side while in a flat bed without using bedrails?: A Little Help needed moving from lying on your back to sitting on the side of a flat bed without using bedrails?: A Little Help needed moving to and from a bed to a chair (including a wheelchair)?: A Little Help needed standing up from a chair using your arms (e.g., wheelchair or bedside chair)?: A Little Help needed to walk in hospital room?: A Little Help needed climbing 3-5 steps with a railing? : A Little 6 Click Score: 18    End of Session Equipment Utilized During Treatment: Gait belt Activity Tolerance: Patient tolerated treatment well Patient left: with call bell/phone within reach;in bed;with bed alarm set   PT Visit Diagnosis: Difficulty in walking, not elsewhere classified (R26.2)    Time: 2080-2233 PT Time Calculation (min) (ACUTE ONLY): 19 min   Charges:   PT Evaluation $PT Eval Low Complexity: 1 Low          Kenyon Ana, PT  Pager: [phone] Acute Rehab  Dept [facility]): [phone]   09/21/2018   [facility] 09/21/2018, 1:14 PM

## 2018-09-21 NOTE — Progress Notes (Signed)
Subjective/Chief Complaint: Had some back pain today and may be having some abdominal pain with liquids   Objective: Vital signs in last 24 hours: Temp:  [98.8 F (37.1 C)-99.1 F (37.3 C)] 99.1 F (37.3 C) (08/16 0558) Pulse Rate:  [68-83] 72 (08/16 0558) Resp:  [16-17] 17 (08/16 0558) BP: (108-130)/(68-86) 130/79 (08/16 0558) SpO2:  [96 %-100 %] 97 % (08/16 0558) Weight:  [59.6 kg] 59.6 kg (08/16 0558) Last BM Date: 09/18/18(Per patient)  Intake/Output from previous day: 08/15 0701 - 08/16 0700 In: 1288.2 [P.O.:480; IV Piggyback:808.2] Out: 450 [Urine:450] Intake/Output this shift: No intake/output data recorded.  Exam: Awake and alert Abdomen soft but slightly more tender in the RUQ  Lab Results:  Recent Labs    09/20/18 0354 09/20/18 1003 09/21/18 0322  WBC 6.0  --  6.4  HGB 9.4* 10.0* 9.2*  HCT 30.7* 32.7* 30.4*  PLT 323  --  301   BMET Recent Labs    09/20/18 0354 09/21/18 0322  NA 139 139  K 3.7 3.8  CL 106 107  CO2 23 21*  GLUCOSE 93 128*  BUN 23 17  CREATININE 1.76* 1.60*  CALCIUM 9.1 9.2   PT/INR Recent Labs    09/20/18 0354  LABPROT 13.2  INR 1.0   ABG No results for input(s): PHART, HCO3 in the last 72 hours.  Invalid input(s): PCO2, PO2  Studies/Results: Nm Hepatobiliary Liver Func  Result Date: 09/19/2018 CLINICAL DATA:  Abdominal pain, nausea and vomiting. Elevated bilirubin level. EXAM: NUCLEAR MEDICINE HEPATOBILIARY IMAGING TECHNIQUE: Sequential images of the abdomen were obtained out to 60 minutes following intravenous administration of radiopharmaceutical. RADIOPHARMACEUTICALS:  7.1 mCi Tc-21m  Choletec IV COMPARISON:  MRI 09/18/2018 and CT AP 09/18/2018 FINDINGS: Prompt uptake and biliary excretion of activity by the liver is seen. Biliary activity passes into small bowel, consistent with patent common bile duct. After 1 hour of imaging no gallbladder activity identified. The patient was then given 2.4 mg of morphine  sulfate, IV. Imaging was then subsequently performed for an additional 30 minutes without visualization of the gallbladder. IMPRESSION: 1. Nonvisualization of the gallbladder compatible with cystic duct obstruction which may be secondary to acute cholecystitis. 2. Patent common bile duct. Electronically Signed   By: Kerby Moors M.D.   On: 09/19/2018 15:25   US Abdomen Limited Ruq  Result Date: 09/19/2018 CLINICAL DATA:  Cholecystitis EXAM: ULTRASOUND ABDOMEN LIMITED RIGHT UPPER QUADRANT COMPARISON:  MRI September 18, 2018, CT September 18, 2018. FINDINGS: Gallbladder: There is a large echogenic mass seen on the peripheral outer wall of the gallbladder with posterior shadowing, likely calcified mass as seen on prior MRI and CT exam. This measures 1.4 by 1.5 x 1.2 cm. There is diffuse gallbladder wall thickening measuring 6 mm. No definite pericholecystic fluid is seen. No sonographic Percell Miller sign is seen. Common bile duct: Diameter: 4 mm. Liver: No focal lesion identified. Within normal limits in parenchymal echogenicity. Portal vein is patent on color Doppler imaging with normal direction of blood flow towards the liver. Other: None. IMPRESSION: 1. Large calcified mass seen on the peripheral outer wall of the gallbladder measuring 1.4 x 1.5 x 1.2 cm with diffuse thickened gallbladder wall. Findings suggestive of cholecystitis. 2. Normal appearing liver. 3. No extrahepatic biliary ductal dilatation. Electronically Signed   By: Prudencio Pair M.D.   On: 09/19/2018 18:11    Anti-infectives: Anti-infectives (From admission, onward)   Start     Dose/Rate Route Frequency Ordered Stop   09/19/18 1600  cefTRIAXone (ROCEPHIN) 2 g in sodium chloride 0.9 % 100 mL IVPB     2 g 200 mL/hr over 30 Minutes Intravenous Every 24 hours 09/18/18 1625     09/18/18 1600  metroNIDAZOLE (FLAGYL) IVPB 500 mg     500 mg 100 mL/hr over 60 Minutes Intravenous Every 8 hours 09/18/18 1516     09/18/18 1515  cefTRIAXone (ROCEPHIN) 2 g  in sodium chloride 0.9 % 100 mL IVPB     2 g 200 mL/hr over 30 Minutes Intravenous  Once 09/18/18 1505 09/18/18 1650      Assessment/Plan: Severe cholecystitis with cholelithiasis  Will continue to try conservative management given overall improvement in her labs. Still, she will eventually need a cholecystectomy but she is very high risks of needing an open procedure.  [name] 09/21/2018

## 2018-09-21 NOTE — Progress Notes (Signed)
PROGRESS NOTE    [name]  FFM:[id number] DOB: 1950-09-05 DOA: 09/18/2018 PCP: Tsosie Billing, MD (Inactive)   Brief Narrative: 68 year old with past medical history significant for right colectomy, breast cancer, diabetes type 2, GERD, asthma, coronary artery disease and multiple other medical problems presents with abdominal pain patient has been having abdominal pain on and off for months.  She presented with worsening abdominal pain and episode of vomiting. Evaluation in the ED: Alkaline phosphatase was 645, AST 215, ALT 325, bilirubin 5.5. CT abdomen pelvis:There is cholelithiasis with diffuse gallbladder wall thickening and edema. There is loss of fat plane between the gallbladder and the distal stomach/proximal duodenum. 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 No air seen in the gallbladder.There is dilatation of the common hepatic and common bile ducts. No intrahepatic biliary duct dilatation seen. No biliary duct mass or calculus evident. A mass at the ampulla could cause these findings. From an imaging standpoint, MRCP would be the optimum study of choice to further evaluate in this regard. MRCP; Cholelithiasis with acute cholecystitis. Associated 2.8 cm cystic lesion along the wall the gallbladder favors localized perforation. Adherent proximal duodenum with mild wall thickening suggest involvement with the right upper quadrant inflammatory process. Direct fistulous communication is not demonstrated on MR.  Additional ancillary findings as above.  Assessment & Plan:   Active Problems:   Elevated liver enzymes  1-Cholecystitis, Transaminases, obstructive jaundice CT abdomen and pelvis with questionable cholecysto enteric fistula and cholecystitis. MRCP negative for fistula, did show cholelithiasis with acute cholecystitis and Associated 2.8 cm cystic lesion along the wall the gallbladder favors localized perforation.  Continue with IV  antibiotics, ceftriaxone, flagyl.  Continue with dilaudid PRN HIDA scan positive for cholecystis  [place] with gallstone , contracted gallbladder. IR and surgery recommend against percutaneous drainage.  Plan to treat with IV antibiotics, started on clear diet.  Continue with medical management, appreciate sx.  LFT trending down.   Anemia: Positive occult blood: Follow hemoglobin.  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 Sonny Dandy. On letrozole and Herceptin.   Gout; flare Start colchicine and allopurinol..   HTN;  Hold oral medication.  Will order PRN hydralazine  Coronary artery disease a status post a stent 2013: Hold aspirin and Lipitor well n.p.o.  Chronic kidney disease a stage IV creatinine lower than recent baseline of 2.2. Stop fluids. Patient on clear diet.   Asthma; continue with Symbicort and scheduled nebulizer GERD continue with PPI.   DM; on SSI.    Estimated body mass index is 26.54 kg/m as calculated from the following:   Height as of this encounter: 4\' 11"  (1.499 m).   Weight as of this encounter: 59.6 kg.   DVT prophylaxis: SCDs Code Status: Full code Family Communication: Care discussed with patient Disposition Plan: Remain in the hospital for treatment of acute cholecystitis Consultants:   General surgery  GI  Procedures:   Right upper quadrant ultrasound  Antimicrobials:    Subjective: She report abdominal pain, back pain, tolerating clears.  Complaints of right toe pain.   Objective: Vitals:   09/20/18 1414 09/20/18 2015 09/20/18 2046 09/21/18 0558  BP: 108/86 118/68  130/79  Pulse: 83 68  72  Resp: 16 16  17   Temp: 98.8 F (37.1 C) 98.8 F (37.1 C)  99.1 F (37.3 C)  TempSrc: Oral Oral  Oral  SpO2: 96% 100% 99% 97%  Weight:  59.6 kg  Height:        Intake/Output Summary (Last 24 hours) at 09/21/2018 0720 Last data filed at 09/21/2018  0300 Gross per 24 hour  Intake 1288.18 ml  Output 450 ml  Net 838.18 ml   Filed Weights   09/19/18 0505 09/20/18 0433 09/21/18 0558  Weight: 60 kg 59.2 kg 59.6 kg    Examination:  General exam: NAD Respiratory system: CTA Cardiovascular system: S 1, S 2 TTT Gastrointestinal system: BS present, soft, mild right quadrant pain.  Central nervous system: non focal.  Extremities: symmetric power.  Skin: no rashes.   Data Reviewed: I have personally reviewed following labs and imaging studies  CBC: Recent Labs  Lab 09/18/18 1216  09/19/18 0548 09/19/18 1826 09/20/18 0354 09/20/18 1003 09/21/18 0322  WBC 7.3  --  5.2  --  6.0  --  6.4  NEUTROABS 5.4  --   --   --  3.8  --   --   HGB 11.0*   < > 10.2* 9.9* 9.4* 10.0* 9.2*  HCT 35.1*   < > 33.2* 31.6* 30.7* 32.7* 30.4*  MCV 99.2  --  99.1  --  99.0  --  100.0  PLT 380  --  317  --  323  --  301   < > = values in this interval not displayed.   Basic Metabolic Panel: Recent Labs  Lab 09/18/18 1216 09/19/18 0548 09/20/18 0354 09/21/18 0322  NA 136 136 139 139  K 4.6 4.0 3.7 3.8  CL 100 102 106 107  CO2 24 24 23  21*  GLUCOSE 179* 132* 93 128*  BUN 25* 22 23 17   CREATININE 1.80* 1.66* 1.76* 1.60*  CALCIUM 10.5* 9.5 9.1 9.2   GFR: Estimated Creatinine Clearance: 25.7 mL/min (A) (by C-G formula based on SCr of 1.6 mg/dL (H)). Liver Function Tests: Recent Labs  Lab 09/18/18 1216 09/19/18 0548 09/20/18 0354 09/21/18 0322  AST 215* 134* 55* 32  ALT 325* 243* 160* 117*  ALKPHOS 645* 577* 510* 420*  BILITOT 5.5* 3.0* 1.5* 1.0  PROT 9.8* 8.3* 7.8 7.7  ALBUMIN 4.0 3.4* 3.2* 3.1*   Recent Labs  Lab 09/18/18 1216  LIPASE 44   No results for input(s): AMMONIA in the last 168 hours. Coagulation Profile: Recent Labs  Lab 09/20/18 0354  INR 1.0   Cardiac Enzymes: No results for input(s): CKTOTAL, CKMB, CKMBINDEX, TROPONINI in the last 168 hours. BNP (last 3 results) No results for input(s): PROBNP in the last  8760 hours. HbA1C: No results for input(s): HGBA1C in the last 72 hours. CBG: Recent Labs  Lab 09/19/18 2104 09/20/18 0730 09/20/18 1146 09/20/18 1636 09/20/18 2114  GLUCAP 86 98 238* 117* 99   Lipid Profile: No results for input(s): CHOL, HDL, LDLCALC, TRIG, CHOLHDL, LDLDIRECT in the last 72 hours. Thyroid Function Tests: No results for input(s): TSH, T4TOTAL, FREET4, T3FREE, THYROIDAB in the last 72 hours. Anemia Panel: No results for input(s): VITAMINB12, FOLATE, FERRITIN, TIBC, IRON, RETICCTPCT in the last 72 hours. Sepsis Labs: No results for input(s): PROCALCITON, LATICACIDVEN in the last 168 hours.  Recent Results (from the past 240 hour(s))  SARS Coronavirus 2 [facility] order, Performed in [facility] hospital lab) Nasopharyngeal Nasopharyngeal Swab     Status: None   Collection Time: 09/18/18  1:22 PM   Specimen: Nasopharyngeal Swab  Result Value Ref Range Status   SARS Coronavirus 2 NEGATIVE NEGATIVE Final    Comment: (NOTE) If result is NEGATIVE SARS-CoV-2  target nucleic acids are NOT DETECTED. The SARS-CoV-2 RNA is generally detectable in upper and lower  respiratory specimens during the acute phase of infection. The lowest  concentration of SARS-CoV-2 viral copies this assay can detect is 250  copies / mL. A negative result does not preclude SARS-CoV-2 infection  and should not be used as the sole basis for treatment or other  patient management decisions.  A negative result may occur with  improper specimen collection / handling, submission of specimen other  than nasopharyngeal swab, presence of viral mutation(s) within the  areas targeted by this assay, and inadequate number of viral copies  (<250 copies / mL). A negative result must be combined with clinical  observations, patient history, and epidemiological information. If result is POSITIVE SARS-CoV-2 target nucleic acids are DETECTED. The SARS-CoV-2 RNA is generally detectable in upper and lower   respiratory specimens dur ing the acute phase of infection.  Positive  results are indicative of active infection with SARS-CoV-2.  Clinical  correlation with patient history and other diagnostic information is  necessary to determine patient infection status.  Positive results do  not rule out bacterial infection or co-infection with other viruses. If result is PRESUMPTIVE POSTIVE SARS-CoV-2 nucleic acids MAY BE PRESENT.   A presumptive positive result was obtained on the submitted specimen  and confirmed on repeat testing.  While 2019 novel coronavirus  (SARS-CoV-2) nucleic acids may be present in the submitted sample  additional confirmatory testing may be necessary for epidemiological  and / or clinical management purposes  to differentiate between  SARS-CoV-2 and other Sarbecovirus currently known to infect humans.  If clinically indicated additional testing with an alternate test  methodology [id number]) is advised. The SARS-CoV-2 RNA is generally  detectable in upper and lower respiratory sp ecimens during the acute  phase of infection. The expected result is Negative. Fact Sheet for Patients:  StrictlyIdeas.no Fact Sheet for Healthcare Providers: BankingDealers.co.za This test is not yet approved or cleared by the [place] FDA and has been authorized for detection and/or diagnosis of SARS-CoV-2 by FDA under an Emergency Use Authorization (EUA).  This EUA will remain in effect (meaning this test can be used) for the duration of the COVID-19 declaration under Section 564(b)(1) of the Act, 21 U.S.C. section 360bbb-3(b)(1), unless the authorization is terminated or revoked sooner. Performed at [facility], [place] [address], [place], [place] [address]          Radiology Studies: Nm Hepatobiliary Liver Func  Result Date: 09/19/2018 CLINICAL DATA:  Abdominal pain, nausea and vomiting. Elevated bilirubin  level. EXAM: NUCLEAR MEDICINE HEPATOBILIARY IMAGING TECHNIQUE: Sequential images of the abdomen were obtained out to 60 minutes following intravenous administration of radiopharmaceutical. RADIOPHARMACEUTICALS:  7.1 mCi Tc-88m  Choletec IV COMPARISON:  MRI 09/18/2018 and CT AP 09/18/2018 FINDINGS: Prompt uptake and biliary excretion of activity by the liver is seen. Biliary activity passes into small bowel, consistent with patent common bile duct. After 1 hour of imaging no gallbladder activity identified. The patient was then given 2.4 mg of morphine sulfate, IV. Imaging was then subsequently performed for an additional 30 minutes without visualization of the gallbladder. IMPRESSION: 1. Nonvisualization of the gallbladder compatible with cystic duct obstruction which may be secondary to acute cholecystitis. 2. Patent common bile duct. Electronically Signed   By: Kerby Moors M.D.   On: 09/19/2018 15:25   US Abdomen Limited Ruq  Result Date: 09/19/2018 CLINICAL DATA:  Cholecystitis EXAM: ULTRASOUND ABDOMEN LIMITED RIGHT UPPER  QUADRANT COMPARISON:  MRI September 18, 2018, CT September 18, 2018. FINDINGS: Gallbladder: There is a large echogenic mass seen on the peripheral outer wall of the gallbladder with posterior shadowing, likely calcified mass as seen on prior MRI and CT exam. This measures 1.4 by 1.5 x 1.2 cm. There is diffuse gallbladder wall thickening measuring 6 mm. No definite pericholecystic fluid is seen. No sonographic Percell Miller sign is seen. Common bile duct: Diameter: 4 mm. Liver: No focal lesion identified. Within normal limits in parenchymal echogenicity. Portal vein is patent on color Doppler imaging with normal direction of blood flow towards the liver. Other: None. IMPRESSION: 1. Large calcified mass seen on the peripheral outer wall of the gallbladder measuring 1.4 x 1.5 x 1.2 cm with diffuse thickened gallbladder wall. Findings suggestive of cholecystitis. 2. Normal appearing liver. 3. No  extrahepatic biliary ductal dilatation. Electronically Signed   By: Prudencio Pair M.D.   On: 09/19/2018 18:11        Scheduled Meds: . insulin aspart  0-5 Units Subcutaneous QHS  . insulin aspart  0-9 Units Subcutaneous TID WC  . mometasone-formoterol  2 puff Inhalation BID  . morphine      . pantoprazole (PROTONIX) IV  40 mg Intravenous Q12H   Continuous Infusions: . cefTRIAXone (ROCEPHIN)  IV Stopped (09/20/18 1630)  . metronidazole Stopped (09/21/18 0009)     LOS: 3 days    Time spent: 35 minutes.     Elmarie Shiley, MD [facility] Pager [phone]  If 7PM-7AM, please contact night-coverage [email or web address] Password [email or web address] 09/21/2018, 7:20 AM

## 2018-09-21 NOTE — Progress Notes (Signed)
Spoke with Dr. Ninfa Linden in room and informed him that the pt has abdominal pain after consuming liquid diet.

## 2018-09-21 NOTE — Progress Notes (Addendum)
Pt ambulated with Physical Therapy in the hall. Pt states that she is in pain but would like to rest. Call light within reach. Will continue to monitor.

## 2018-09-22 LAB — CBC
HCT: 29.5 % — ABNORMAL LOW (ref 36.0–46.0)
Hemoglobin: 9 g/dL — ABNORMAL LOW (ref 12.0–15.0)
MCH: 31 pg (ref 26.0–34.0)
MCHC: 30.5 g/dL (ref 30.0–36.0)
MCV: 101.7 fL — ABNORMAL HIGH (ref 80.0–100.0)
Platelets: 288 10*3/uL (ref 150–400)
RBC: 2.9 MIL/uL — ABNORMAL LOW (ref 3.87–5.11)
RDW: 14.4 % (ref 11.5–15.5)
WBC: 5.7 10*3/uL (ref 4.0–10.5)
nRBC: 0 % (ref 0.0–0.2)

## 2018-09-22 LAB — COMPREHENSIVE METABOLIC PANEL
ALT: 86 U/L — ABNORMAL HIGH (ref 0–44)
AST: 26 U/L (ref 15–41)
Albumin: 3 g/dL — ABNORMAL LOW (ref 3.5–5.0)
Alkaline Phosphatase: 353 U/L — ABNORMAL HIGH (ref 38–126)
Anion gap: 6 (ref 5–15)
BUN: 12 mg/dL (ref 8–23)
CO2: 26 mmol/L (ref 22–32)
Calcium: 8.9 mg/dL (ref 8.9–10.3)
Chloride: 107 mmol/L (ref 98–111)
Creatinine, Ser: 1.6 mg/dL — ABNORMAL HIGH (ref 0.44–1.00)
GFR calc Af Amer: 37 mL/min — ABNORMAL LOW (ref >60)
GFR calc non Af Amer: 32 mL/min — ABNORMAL LOW (ref >60)
Glucose, Bld: 126 mg/dL — ABNORMAL HIGH (ref 70–99)
Potassium: 4 mmol/L (ref 3.5–5.1)
Sodium: 139 mmol/L (ref 135–145)
Total Bilirubin: 0.9 mg/dL (ref 0.3–1.2)
Total Protein: 7.4 g/dL (ref 6.5–8.1)

## 2018-09-22 LAB — GLUCOSE, CAPILLARY
Glucose-Capillary: 117 mg/dL — ABNORMAL HIGH (ref 70–99)
Glucose-Capillary: 127 mg/dL — ABNORMAL HIGH (ref 70–99)
Glucose-Capillary: 133 mg/dL — ABNORMAL HIGH (ref 70–99)
Glucose-Capillary: 174 mg/dL — ABNORMAL HIGH (ref 70–99)

## 2018-09-22 MED ORDER — ALUM & MAG HYDROXIDE-SIMETH 200-200-20 MG/5ML PO SUSP
30.0000 mL | ORAL | Status: DC | PRN
  Administered 2018-09-22: 30 mL via ORAL
  Filled 2018-09-22: qty 30

## 2018-09-22 NOTE — Progress Notes (Signed)
   Subjective/Chief Complaint: No complaints Pain a little less   Objective: Vital signs in last 24 hours: Temp:  [98.1 F (36.7 C)-100.2 F (37.9 C)] 98.1 F (36.7 C) (08/17 0609) Pulse Rate:  [71-77] 71 (08/17 0609) Resp:  [16-18] 18 (08/17 0609) BP: (128-133)/(67-77) 128/67 (08/17 0609) SpO2:  [95 %-99 %] 97 % (08/17 0916) Last BM Date: 09/22/18  Intake/Output from previous day: 08/16 0701 - 08/17 0700 In: 360 [P.O.:360] Out: -  Intake/Output this shift: Total I/O In: 240 [P.O.:240] Out: -   Exam: Abdomen soft with mild RUQ and epigastric guarding Awake and alert  Lab Results:  Recent Labs    09/21/18 0322 09/22/18 0333  WBC 6.4 5.7  HGB 9.2* 9.0*  HCT 30.4* 29.5*  PLT 301 288   BMET Recent Labs    09/21/18 0322 09/22/18 0333  NA 139 139  K 3.8 4.0  CL 107 107  CO2 21* 26  GLUCOSE 128* 126*  BUN 17 12  CREATININE 1.60* 1.60*  CALCIUM 9.2 8.9   PT/INR Recent Labs    09/20/18 0354  LABPROT 13.2  INR 1.0   ABG No results for input(s): PHART, HCO3 in the last 72 hours.  Invalid input(s): PCO2, PO2  Studies/Results: No results found.  Anti-infectives: Anti-infectives (From admission, onward)   Start     Dose/Rate Route Frequency Ordered Stop   09/19/18 1600  cefTRIAXone (ROCEPHIN) 2 g in sodium chloride 0.9 % 100 mL IVPB     2 g 200 mL/hr over 30 Minutes Intravenous Every 24 hours 09/18/18 1625     09/18/18 1600  metroNIDAZOLE (FLAGYL) IVPB 500 mg     500 mg 100 mL/hr over 60 Minutes Intravenous Every 8 hours 09/18/18 1516     09/18/18 1515  cefTRIAXone (ROCEPHIN) 2 g in sodium chloride 0.9 % 100 mL IVPB     2 g 200 mL/hr over 30 Minutes Intravenous  Once 09/18/18 1505 09/18/18 1650      Assessment/Plan: Severe cholecystitis with gallstones  LFT continuing to improve.  TBili now normal. Again, was currently not a candidate to try a perc drain by IR.   She is a high risk candidate for a lap chole currently with risk of bile duct  or bowel injury. Will continue antibiotics Will try full liquids  If she continues to improve, will d/c on oral antibiotics and plan a lap chole in 6-8 weeks. If she doesn't, then will at least repeat her Ultrasound to see if a drain can be placed vs surgery  LOS: 4 days    [name] 09/22/2018

## 2018-09-22 NOTE — Care Management Important Message (Signed)
Important Message  Patient Details IM Letter given to Sharren Bridge SW to present to the Patient Name: [name] MRN: [id number] Date of Birth: 1950-11-07   Medicare Important Message Given:  Yes     Kerin Salen 09/22/2018, 11:38 AM

## 2018-09-22 NOTE — Progress Notes (Signed)
Occupational Therapy Evaluation Patient Details Name: [name] MRN: [id number] DOB: 1950-10-26 Today's Date: 09/22/2018    History of Present Illness 68 year old with past medical history significant for right colectomy, breast cancer, diabetes type 2, GERD, asthma, coronary artry disease and multiple other medical problems. admiitted with  worsening abdominal pain and episode of vomiting-->elevated liver enzymes, cholelithiasis with acute cholecystitis   Clinical Impression   Pt presents with above diagnosis. PTA pt PLOF Mod I with increased time and use of AE for ADLs and lives with family who is available for 24 hour support. Pt currently limited due to L foot and abdominal pain and safety awareness. Pt will benefit from continued acute OT to address deficits with education in safety awareness and pain management to maximize independence in ADLs prior to dc to home setting.     Follow Up Recommendations  No OT follow up    Equipment Recommendations       Recommendations for Other Services       Precautions / Restrictions Precautions Precautions: Fall Precaution Comments: R foot gout Restrictions Weight Bearing Restrictions: No      Mobility Bed Mobility Overal bed mobility: Needs Assistance Bed Mobility: Supine to Sit     Supine to sit: Min assist;Modified independent (Device/Increase time)     General bed mobility comments: incr time  Transfers Overall transfer level: Needs assistance Equipment used: 4-wheeled walker(personal rollator) Transfers: Sit to/from [facility] Sit to Stand: Min guard         General transfer comment: cues for hand placement and safety. increased time due to pain for L foot.    Balance Overall balance assessment: Needs assistance Sitting-balance support: No upper extremity supported;Feet supported Sitting balance-Leahy Scale: Good       Standing balance-Leahy Scale: Poor Standing balance comment:  reliant on UE support                           ADL either performed or assessed with clinical judgement   ADL Overall ADL's : Modified independent                                       General ADL Comments: Pt requires increased time and use of AE to perform ADLs. No physical assistance required at this time for ADLs. However, pt does exhibit pain and discomfort of right foot.      Vision         Perception     Praxis      Pertinent Vitals/Pain Pain Assessment: 0-10 Pain Score: 4  Pain Location: L foot (gout pain) and stomach pain. Pain Descriptors / Indicators: Grimacing;Sore     Hand Dominance Right   Extremity/Trunk Assessment Upper Extremity Assessment Upper Extremity Assessment: Overall WFL for tasks assessed   Lower Extremity Assessment Lower Extremity Assessment: Defer to PT evaluation       Communication Communication Communication: HOH   Cognition Arousal/Alertness: Awake/alert Behavior During Therapy: WFL for tasks assessed/performed Overall Cognitive Status: Within Functional Limits for tasks assessed                                     General Comments       Exercises     Shoulder Instructions      Home Living  Family/patient expects to be discharged to:: Private residence Living Arrangements: Children;Other relatives Available Help at Discharge: Available 24 hours/day Type of Home: House Home Access: Stairs to enter [employer] of Steps: 4 Entrance Stairs-Rails: Right Home Layout: One level     Bathroom Shower/Tub: Teacher, early years/pre: Standard     Home Equipment: Cane - single point;Walker - 4 wheels;Shower seat;Bedside commode   Additional Comments: pt lives with son and grandson, she states they assist as needed (previous notes state pt has aide 5x/wk)      Prior Functioning/Environment Level of Independence: Independent with assistive device(s)         Comments: amb with rollator        OT Problem List: Impaired balance (sitting and/or standing);Decreased safety awareness;Decreased knowledge of use of DME or AE;Pain      OT Treatment/Interventions: Self-care/ADL training;Therapeutic exercise;Therapeutic activities;Patient/family education;Balance training    OT Goals(Current goals can be found in the care plan section) Acute Rehab OT Goals Patient Stated Goal: no goal stated OT Goal Formulation: With patient Time For Goal Achievement: 09/22/18 Potential to Achieve Goals: Fair  OT Frequency: Min 1X/week   Barriers to D/C:            Co-evaluation              AM-PAC OT "6 Clicks" Daily Activity     Outcome Measure Help from another person eating meals?: None Help from another person taking care of personal grooming?: None Help from another person toileting, which includes using toliet, bedpan, or urinal?: None Help from another person bathing (including washing, rinsing, drying)?: None Help from another person to put on and taking off regular upper body clothing?: None Help from another person to put on and taking off regular lower body clothing?: None 6 Click Score: 24   End of Session Equipment Utilized During Treatment: Gait belt;Rolling walker Nurse Communication: Mobility status  Activity Tolerance: Patient limited by pain Patient left: in chair;with call bell/phone within reach  OT Visit Diagnosis: Unsteadiness on feet (R26.81);Pain Pain - part of body: (stomach and L foot)                Time: 9983-3825 OT Time Calculation (min): 18 min Charges:  OT General Charges $OT Visit: 1 Visit OT Evaluation $OT Eval Low Complexity: [place], MSOT, OTR/L  Supplemental Rehabilitation Services  [phone]   Marius Ditch 09/22/2018, 11:00 AM

## 2018-09-22 NOTE — Progress Notes (Signed)
PROGRESS NOTE    [name]  OEU:[id number] DOB: 11/02/1950 DOA: 09/18/2018 PCP: Tsosie Billing, MD (Inactive)   Brief Narrative: 68 year old with past medical history significant for right colectomy, breast cancer, diabetes type 2, GERD, asthma, coronary artery disease and multiple other medical problems presents with abdominal pain patient has been having abdominal pain on and off for months.  She presented with worsening abdominal pain and episode of vomiting. Evaluation in the ED: Alkaline phosphatase was 645, AST 215, ALT 325, bilirubin 5.5. CT abdomen pelvis:There is cholelithiasis with diffuse gallbladder wall thickening and edema. There is loss of fat plane between the gallbladder and the distal stomach/proximal duodenum. 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 No air seen in the gallbladder.There is dilatation of the common hepatic and common bile ducts. No intrahepatic biliary duct dilatation seen. No biliary duct mass or calculus evident. A mass at the ampulla could cause these findings. From an imaging standpoint, MRCP would be the optimum study of choice to further evaluate in this regard. MRCP; Cholelithiasis with acute cholecystitis. Associated 2.8 cm cystic lesion along the wall the gallbladder favors localized perforation. Adherent proximal duodenum with mild wall thickening suggest involvement with the right upper quadrant inflammatory process. Direct fistulous communication is not demonstrated on MR.  Additional ancillary findings as above.  Assessment & Plan:   Active Problems:   Elevated liver enzymes  1-Cholecystitis, Transaminases, obstructive jaundice CT abdomen and pelvis with questionable cholecysto enteric fistula and cholecystitis. MRCP negative for fistula, did show cholelithiasis with acute cholecystitis and Associated 2.8 cm cystic lesion along the wall the gallbladder favors localized perforation.  Continue with IV  antibiotics, ceftriaxone, flagyl.  Continue with dilaudid PRN HIDA scan positive for cholecystis  [place] with gallstone , contracted gallbladder. IR and surgery recommend against percutaneous drainage.  Plan to treat with IV antibiotics. Continue with medical management, appreciate sx.  LFT trending down.  Plan to advance diet today.   Anemia: Positive occult blood: Follow hemoglobin.  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 Sonny Dandy. On letrozole and Herceptin.   Gout; flare Start colchicine and allopurinol..   HTN;  Hold oral medication.  Will order PRN hydralazine  Coronary artery disease a status post a stent 2013: Hold aspirin and Lipitor well n.p.o.  Chronic kidney disease a stage IV creatinine lower than recent baseline of 2.2. Stop fluids. Patient on clear diet.   Asthma; continue with Symbicort and scheduled nebulizer GERD continue with PPI.   DM; on SSI.    Estimated body mass index is 26.54 kg/m as calculated from the following:   Height as of this encounter: 4\' 11"  (1.499 m).   Weight as of this encounter: 59.6 kg.   DVT prophylaxis: SCDs Code Status: Full code Family Communication: Care discussed with patient Disposition Plan: Remain in the hospital for treatment of acute cholecystitis Consultants:   General surgery  GI  Procedures:   Right upper quadrant ultrasound  Antimicrobials:    Subjective: She report mild abdominal pain.  Report pain chest, prior surgery scar.  Had BM yesterday.    Objective: Vitals:   09/21/18 2022 09/22/18 0609 09/22/18 0916 09/22/18 1248  BP: 133/77 128/67  (!) 143/73  Pulse: 77 71  70  Resp: 17 18  18   Temp: 98.4 F (36.9 C) 98.1 F (36.7 C)  99.1 F (37.3 C)  TempSrc: Oral Oral  Oral  SpO2: 98% 99% 97%  98%  Weight:      Height:        Intake/Output Summary (Last 24 hours) at 09/22/2018 1306 Last data filed at 09/22/2018  0900 Gross per 24 hour  Intake 240 ml  Output -  Net 240 ml   Filed Weights   09/19/18 0505 09/20/18 0433 09/21/18 0558  Weight: 60 kg 59.2 kg 59.6 kg    Examination:  General exam: NAD Respiratory system: CTA Cardiovascular system: S 1, S 2 RRR Gastrointestinal system: BS present, soft, mild tender. ,  Central nervous system: Non focal.  Extremities: Symmetric power.  Skin: no rashes.   Data Reviewed: I have personally reviewed following labs and imaging studies  CBC: Recent Labs  Lab 09/18/18 1216  09/19/18 0548 09/19/18 1826 09/20/18 0354 09/20/18 1003 09/21/18 0322 09/22/18 0333  WBC 7.3  --  5.2  --  6.0  --  6.4 5.7  NEUTROABS 5.4  --   --   --  3.8  --   --   --   HGB 11.0*   < > 10.2* 9.9* 9.4* 10.0* 9.2* 9.0*  HCT 35.1*   < > 33.2* 31.6* 30.7* 32.7* 30.4* 29.5*  MCV 99.2  --  99.1  --  99.0  --  100.0 101.7*  PLT 380  --  317  --  323  --  301 288   < > = values in this interval not displayed.   Basic Metabolic Panel: Recent Labs  Lab 09/18/18 1216 09/19/18 0548 09/20/18 0354 09/21/18 0322 09/22/18 0333  NA 136 136 139 139 139  K 4.6 4.0 3.7 3.8 4.0  CL 100 102 106 107 107  CO2 24 24 23  21* 26  GLUCOSE 179* 132* 93 128* 126*  BUN 25* 22 23 17 12   CREATININE 1.80* 1.66* 1.76* 1.60* 1.60*  CALCIUM 10.5* 9.5 9.1 9.2 8.9   GFR: Estimated Creatinine Clearance: 25.7 mL/min (A) (by C-G formula based on SCr of 1.6 mg/dL (H)). Liver Function Tests: Recent Labs  Lab 09/18/18 1216 09/19/18 0548 09/20/18 0354 09/21/18 0322 09/22/18 0333  AST 215* 134* 55* 32 26  ALT 325* 243* 160* 117* 86*  ALKPHOS 645* 577* 510* 420* 353*  BILITOT 5.5* 3.0* 1.5* 1.0 0.9  PROT 9.8* 8.3* 7.8 7.7 7.4  ALBUMIN 4.0 3.4* 3.2* 3.1* 3.0*   Recent Labs  Lab 09/18/18 1216  LIPASE 44   No results for input(s): AMMONIA in the last 168 hours. Coagulation Profile: Recent Labs  Lab 09/20/18 0354  INR 1.0   Cardiac Enzymes: No results for input(s): CKTOTAL, CKMB,  CKMBINDEX, TROPONINI in the last 168 hours. BNP (last 3 results) No results for input(s): PROBNP in the last 8760 hours. HbA1C: No results for input(s): HGBA1C in the last 72 hours. CBG: Recent Labs  Lab 09/21/18 1150 09/21/18 1604 09/21/18 2025 09/22/18 0733 09/22/18 1121  GLUCAP 137* 101* 127* 117* 127*   Lipid Profile: No results for input(s): CHOL, HDL, LDLCALC, TRIG, CHOLHDL, LDLDIRECT in the last 72 hours. Thyroid Function Tests: No results for input(s): TSH, T4TOTAL, FREET4, T3FREE, THYROIDAB in the last 72 hours. Anemia Panel: No results for input(s): VITAMINB12, FOLATE, FERRITIN, TIBC, IRON, RETICCTPCT in the last 72 hours. Sepsis Labs: No results for input(s): PROCALCITON, LATICACIDVEN in the last 168 hours.  Recent Results (from the past 240 hour(s))  SARS Coronavirus 2 [facility] order, Performed in [facility] hospital lab) Nasopharyngeal Nasopharyngeal Swab     Status: None   Collection Time: 09/18/18  1:22 PM   Specimen: Nasopharyngeal Swab  Result Value Ref Range Status   SARS Coronavirus 2 NEGATIVE NEGATIVE Final    Comment: (NOTE) If result is NEGATIVE SARS-CoV-2 target nucleic acids are NOT DETECTED. The SARS-CoV-2 RNA is generally detectable in upper and lower  respiratory specimens during the acute phase of infection. The lowest  concentration of SARS-CoV-2 viral copies this assay can detect is 250  copies / mL. A negative result does not preclude SARS-CoV-2 infection  and should not be used as the sole basis for treatment or other  patient management decisions.  A negative result may occur with  improper specimen collection / handling, submission of specimen other  than nasopharyngeal swab, presence of viral mutation(s) within the  areas targeted by this assay, and inadequate number of viral copies  (<250 copies / mL). A negative result must be combined with clinical  observations, patient history, and epidemiological information. If result is  POSITIVE SARS-CoV-2 target nucleic acids are DETECTED. The SARS-CoV-2 RNA is generally detectable in upper and lower  respiratory specimens dur ing the acute phase of infection.  Positive  results are indicative of active infection with SARS-CoV-2.  Clinical  correlation with patient history and other diagnostic information is  necessary to determine patient infection status.  Positive results do  not rule out bacterial infection or co-infection with other viruses. If result is PRESUMPTIVE POSTIVE SARS-CoV-2 nucleic acids MAY BE PRESENT.   A presumptive positive result was obtained on the submitted specimen  and confirmed on repeat testing.  While 2019 novel coronavirus  (SARS-CoV-2) nucleic acids may be present in the submitted sample  additional confirmatory testing may be necessary for epidemiological  and / or clinical management purposes  to differentiate between  SARS-CoV-2 and other Sarbecovirus currently known to infect humans.  If clinically indicated additional testing with an alternate test  methodology [phone]) is advised. The SARS-CoV-2 RNA is generally  detectable in upper and lower respiratory sp ecimens during the acute  phase of infection. The expected result is Negative. Fact Sheet for Patients:  StrictlyIdeas.no Fact Sheet for Healthcare Providers: BankingDealers.co.za This test is not yet approved or cleared by the [place] FDA and has been authorized for detection and/or diagnosis of SARS-CoV-2 by FDA under an Emergency Use Authorization (EUA).  This EUA will remain in effect (meaning this test can be used) for the duration of the COVID-19 declaration under Section 564(b)(1) of the Act, 21 U.S.C. section 360bbb-3(b)(1), unless the authorization is terminated or revoked sooner. Performed at [facility], [place] [address] [place], [place] [address]          Radiology Studies: No results  found.      Scheduled Meds: . allopurinol  100 mg Oral Daily  . colchicine  0.6 mg Oral Daily  . insulin aspart  0-5 Units Subcutaneous QHS  . insulin aspart  0-9 Units Subcutaneous TID WC  . mometasone-formoterol  2 puff Inhalation BID  . morphine      . pantoprazole (PROTONIX) IV  40 mg Intravenous Q12H   Continuous Infusions: . cefTRIAXone (ROCEPHIN)  IV 2 g (09/21/18 1609)  . metronidazole 500 mg (09/22/18 0853)     LOS: 4 days    Time spent: 35 minutes.     Elmarie Shiley, MD [facility] Pager [phone]  If 7PM-7AM, please contact night-coverage [email or web address] Password [email or web address] 09/22/2018, 1:06 PM

## 2018-09-23 ENCOUNTER — Telehealth: Payer: Self-pay

## 2018-09-23 ENCOUNTER — Other Ambulatory Visit: Payer: Self-pay

## 2018-09-23 LAB — COMPREHENSIVE METABOLIC PANEL
ALT: 60 U/L — ABNORMAL HIGH (ref 0–44)
AST: 23 U/L (ref 15–41)
Albumin: 2.9 g/dL — ABNORMAL LOW (ref 3.5–5.0)
Alkaline Phosphatase: 289 U/L — ABNORMAL HIGH (ref 38–126)
Anion gap: 7 (ref 5–15)
BUN: 12 mg/dL (ref 8–23)
CO2: 22 mmol/L (ref 22–32)
Calcium: 8.3 mg/dL — ABNORMAL LOW (ref 8.9–10.3)
Chloride: 111 mmol/L (ref 98–111)
Creatinine, Ser: 1.48 mg/dL — ABNORMAL HIGH (ref 0.44–1.00)
GFR calc Af Amer: 41 mL/min — ABNORMAL LOW (ref >60)
GFR calc non Af Amer: 36 mL/min — ABNORMAL LOW (ref >60)
Glucose, Bld: 235 mg/dL — ABNORMAL HIGH (ref 70–99)
Potassium: 3.5 mmol/L (ref 3.5–5.1)
Sodium: 140 mmol/L (ref 135–145)
Total Bilirubin: 0.8 mg/dL (ref 0.3–1.2)
Total Protein: 7.1 g/dL (ref 6.5–8.1)

## 2018-09-23 LAB — GLUCOSE, CAPILLARY
Glucose-Capillary: 121 mg/dL — ABNORMAL HIGH (ref 70–99)
Glucose-Capillary: 252 mg/dL — ABNORMAL HIGH (ref 70–99)
Glucose-Capillary: 63 mg/dL — ABNORMAL LOW (ref 70–99)
Glucose-Capillary: 108 mg/dL — ABNORMAL HIGH (ref 70–99)
Glucose-Capillary: 166 mg/dL — ABNORMAL HIGH (ref 70–99)

## 2018-09-23 LAB — CBC
HCT: 31.1 % — ABNORMAL LOW (ref 36.0–46.0)
Hemoglobin: 9.5 g/dL — ABNORMAL LOW (ref 12.0–15.0)
MCH: 30.7 pg (ref 26.0–34.0)
MCHC: 30.5 g/dL (ref 30.0–36.0)
MCV: 100.6 fL — ABNORMAL HIGH (ref 80.0–100.0)
Platelets: 287 10*3/uL (ref 150–400)
RBC: 3.09 MIL/uL — ABNORMAL LOW (ref 3.87–5.11)
RDW: 14.2 % (ref 11.5–15.5)
WBC: 6.5 10*3/uL (ref 4.0–10.5)
nRBC: 0 % (ref 0.0–0.2)

## 2018-09-23 MED ORDER — ENSURE ENLIVE PO LIQD
237.0000 mL | Freq: Two times a day (BID) | ORAL | Status: DC
  Administered 2018-09-23 – 2018-09-25 (×5): 237 mL via ORAL

## 2018-09-23 MED ORDER — AMLODIPINE BESYLATE 5 MG PO TABS
5.0000 mg | ORAL_TABLET | Freq: Every day | ORAL | Status: DC
  Administered 2018-09-23 – 2018-09-25 (×3): 5 mg via ORAL
  Filled 2018-09-23 (×3): qty 1

## 2018-09-23 MED ORDER — METOPROLOL SUCCINATE ER 50 MG PO TB24
50.0000 mg | ORAL_TABLET | Freq: Every day | ORAL | Status: DC
  Administered 2018-09-23 – 2018-09-24 (×2): 50 mg via ORAL
  Filled 2018-09-23 (×2): qty 1

## 2018-09-23 NOTE — Progress Notes (Signed)
[facility]:  [phone] General Surgery Progress Note   LOS: 5 days  POD -     Chief Complaint: Abdominal pian  Assessment and Plan: 1.  Cholecystitis/cholelithiasis  On Rocephin/flagyl   Cannot perc drain.  Poor surgical candidate at this time.  She is a high risk candidate for a lap chole currently with risk of bile duct or bowel injury.  We can see her in 6 to 8 weeks to consider interval cholecystectomy.  Okay to advance diet.   2.  History of right breast cancer - Her2Neu positive  Right mastectomy - 11/27/2017 - [facility]  On herceptin by Dr. Lindi Adie 3.  CAD 4.  CVA 5.  DM 6.  CKD - Creatinine - 1.6 - 09/22/2018 7.  Anemia   Hgb - 9.0 - 09/22/2018 8. DVT prophylaxis - on no chemoprophylaxis   Active Problems:   Elevated liver enzymes  Subjective:  Feels okay.  Tolerating diet.    Objective:   Vitals:   09/23/18 0529 09/23/18 0757  BP: 138/77   Pulse: 68   Resp: 17   Temp: 98.5 F (36.9 C)   SpO2: 100% 98%     Intake/Output from previous day:  08/17 0701 - 08/18 0700 In: 1860 [P.O.:960; IV Piggyback:900] Out: 701 [Urine:700; Stool:1]  Intake/Output this shift:  No intake/output data recorded.   Physical Exam:   General: WN older AA F who is alert and oriented.    HEENT: Normal. Pupils equal. .   Lungs: Clear.  Port in left upper chest.   Abdomen: Distended, but no localized tenderness.  She has a midline incision which is unexplained.   Lab Results:    Recent Labs    09/21/18 0322 09/22/18 0333  WBC 6.4 5.7  HGB 9.2* 9.0*  HCT 30.4* 29.5*  PLT 301 288    BMET   Recent Labs    09/21/18 0322 09/22/18 0333  NA 139 139  K 3.8 4.0  CL 107 107  CO2 21* 26  GLUCOSE 128* 126*  BUN 17 12  CREATININE 1.60* 1.60*  CALCIUM 9.2 8.9    PT/INR  No results for input(s): LABPROT, INR in the last 72 hours.  ABG  No results for input(s): PHART, HCO3 in the last 72 hours.  Invalid input(s): PCO2,  PO2   Studies/Results:  No results found.   Anti-infectives:   Anti-infectives (From admission, onward)   Start     Dose/Rate Route Frequency Ordered Stop   09/19/18 1600  cefTRIAXone (ROCEPHIN) 2 g in sodium chloride 0.9 % 100 mL IVPB     2 g 200 mL/hr over 30 Minutes Intravenous Every 24 hours 09/18/18 1625     09/18/18 1600  metroNIDAZOLE (FLAGYL) IVPB 500 mg     500 mg 100 mL/hr over 60 Minutes Intravenous Every 8 hours 09/18/18 1516     09/18/18 1515  cefTRIAXone (ROCEPHIN) 2 g in sodium chloride 0.9 % 100 mL IVPB     2 g 200 mL/hr over 30 Minutes Intravenous  Once 09/18/18 1505 09/18/18 1650      Alphonsa Overall, MD, FACS Pager: [facility] Office: [phone] 09/23/2018

## 2018-09-23 NOTE — Telephone Encounter (Signed)
RN spoke with patient, patient currently is admitted to hospital and wants to cancel appointments for 09/25/2018.    RN cancelled appointments, scheduling message sent to get rescheduled once discharged.  RN encouraged patient to call us once she has been discharged as well.

## 2018-09-23 NOTE — Progress Notes (Signed)
Hypoglycemic Event  CBG: 63 at 16:21  Treatment: 4 oz of apple juice   Symptoms: None   Follow-up CBG: Time: 1706 CBG Result: 108  Possible Reasons for Event: Poor oral intake  Comments/MD notified: Dr. Ashok Pall A Adelle Zachar

## 2018-09-23 NOTE — Progress Notes (Signed)
PROGRESS NOTE    [name]  WFU:[id number] DOB: 04/06/1950 DOA: 09/18/2018 PCP: Tsosie Billing, MD (Inactive)   Brief Narrative: 68 year old with past medical history significant for right colectomy, breast cancer, diabetes type 2, GERD, asthma, coronary artery disease and multiple other medical problems presents with abdominal pain patient has been having abdominal pain on and off for months.  She presented with worsening abdominal pain and episode of vomiting. Evaluation in the ED: Alkaline phosphatase was 645, AST 215, ALT 325, bilirubin 5.5. CT abdomen pelvis:There is cholelithiasis with diffuse gallbladder wall thickening and edema. There is loss of fat plane between the gallbladder and the distal stomach/proximal duodenum. 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 No air seen in the gallbladder.There is dilatation of the common hepatic and common bile ducts. No intrahepatic biliary duct dilatation seen. No biliary duct mass or calculus evident. A mass at the ampulla could cause these findings. From an imaging standpoint, MRCP would be the optimum study of choice to further evaluate in this regard. MRCP; Cholelithiasis with acute cholecystitis. Associated 2.8 cm cystic lesion along the wall the gallbladder favors localized perforation. Adherent proximal duodenum with mild wall thickening suggest involvement with the right upper quadrant inflammatory process. Direct fistulous communication is not demonstrated on MR.  Patient admitted with cholecystitis and obstructive jaundice.  Evaluated by surgery who is recommending medical treatment with IV antibiotics.  Advance diet as tolerated.    Assessment & Plan:   Active Problems:   Elevated liver enzymes  1-Cholecystitis, Transaminases, obstructive jaundice CT abdomen and pelvis with questionable cholecysto enteric fistula and cholecystitis. MRCP negative for fistula, did show cholelithiasis with  acute cholecystitis and Associated 2.8 cm cystic lesion along the wall the gallbladder favors localized perforation.  Continue with IV antibiotics, ceftriaxone, flagyl.  Continue with dilaudid PRN HIDA scan positive for cholecystis  [place] with gallstone , contracted gallbladder. IR and surgery recommend against percutaneous drainage.  Plan to treat with IV antibiotics. Continue with medical management, appreciate sx.  LFT trending down.  Plan is to  advance diet.  When patient is stable enough plan is to discharge home on oral antibiotics and follow-up with surgery in 6 to 8 weeks to have  cholecystectomy.  Anemia: Positive occult blood: Follow hemoglobin.  Continue with PPI Hemoglobin remained stable.  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 Sonny Dandy. On letrozole and Herceptin.   Gout; flare Start colchicine and allopurinol..   HTN; resume Norvasc and metoprolol Will order PRN hydralazine  Coronary artery disease a status post a stent 2013: Hold aspirin and Lipitor well n.p.o.  Chronic kidney disease a stage IV creatinine lower than recent baseline of 2.2. Stop fluids. Patient on clear diet.  Remained stable Asthma; continue with Symbicort and scheduled nebulizer GERD continue with PPI.   DM; on SSI.    Estimated body mass index is 26.36 kg/m as calculated from the following:   Height as of this encounter: 4\' 11"  (1.499 m).   Weight as of this encounter: 59.2 kg.   DVT prophylaxis: SCDs Code Status: Full code Family Communication: Care discussed with patient and sister over the phone on 8/17 Disposition Plan: Remain in the hospital for treatment of acute cholecystitis Consultants:   General surgery  GI  Procedures:   Right upper quadrant ultrasound  Antimicrobials:    Subjective: She is still report mild abdominal pain.  She had a bowel movement.  Her stool was a  little bit dark.  Monitor for  now.    Objective: Vitals:   09/22/18 2019 09/23/18 0529 09/23/18 0757 09/23/18 1416  BP: 111/73 138/77  (!) 177/63  Pulse: 93 68  75  Resp: 19 17  18   Temp: 98.7 F (37.1 C) 98.5 F (36.9 C)  99 F (37.2 C)  TempSrc:    Oral  SpO2: 98% 100% 98% 100%  Weight:  59.2 kg    Height:        Intake/Output Summary (Last 24 hours) at 09/23/2018 1440 Last data filed at 09/23/2018 1225 Gross per 24 hour  Intake 1740 ml  Output 300 ml  Net 1440 ml   Filed Weights   09/20/18 0433 09/21/18 0558 09/23/18 0529  Weight: 59.2 kg 59.6 kg 59.2 kg    Examination:  General exam: NAD Respiratory system: CTA Cardiovascular system: S 1, S 2 RR Gastrointestinal system: BS present, soft, nt Central nervous system: Non focal.  Extremities: Symmetric power.  Skin: no rashes.   Data Reviewed: I have personally reviewed following labs and imaging studies  CBC: Recent Labs  Lab 09/18/18 1216  09/19/18 0548  09/20/18 0354 09/20/18 1003 09/21/18 0322 09/22/18 0333 09/23/18 0943  WBC 7.3  --  5.2  --  6.0  --  6.4 5.7 6.5  NEUTROABS 5.4  --   --   --  3.8  --   --   --   --   HGB 11.0*   < > 10.2*   < > 9.4* 10.0* 9.2* 9.0* 9.5*  HCT 35.1*   < > 33.2*   < > 30.7* 32.7* 30.4* 29.5* 31.1*  MCV 99.2  --  99.1  --  99.0  --  100.0 101.7* 100.6*  PLT 380  --  317  --  323  --  301 288 287   < > = values in this interval not displayed.   Basic Metabolic Panel: Recent Labs  Lab 09/19/18 0548 09/20/18 0354 09/21/18 0322 09/22/18 0333 09/23/18 0943  NA 136 139 139 139 140  K 4.0 3.7 3.8 4.0 3.5  CL 102 106 107 107 111  CO2 24 23 21* 26 22  GLUCOSE 132* 93 128* 126* 235*  BUN 22 23 17 12 12   CREATININE 1.66* 1.76* 1.60* 1.60* 1.48*  CALCIUM 9.5 9.1 9.2 8.9 8.3*   GFR: Estimated Creatinine Clearance: 27.7 mL/min (A) (by C-G formula based on SCr of 1.48 mg/dL (H)). Liver Function Tests: Recent Labs  Lab 09/19/18 0548 09/20/18 0354 09/21/18 0322 09/22/18 0333 09/23/18 0943   AST 134* 55* 32 26 23  ALT 243* 160* 117* 86* 60*  ALKPHOS 577* 510* 420* 353* 289*  BILITOT 3.0* 1.5* 1.0 0.9 0.8  PROT 8.3* 7.8 7.7 7.4 7.1  ALBUMIN 3.4* 3.2* 3.1* 3.0* 2.9*   Recent Labs  Lab 09/18/18 1216  LIPASE 44   No results for input(s): AMMONIA in the last 168 hours. Coagulation Profile: Recent Labs  Lab 09/20/18 0354  INR 1.0   Cardiac Enzymes: No results for input(s): CKTOTAL, CKMB, CKMBINDEX, TROPONINI in the last 168 hours. BNP (last 3 results) No results for input(s): PROBNP in the last 8760 hours. HbA1C: No results for input(s): HGBA1C in the last 72 hours. CBG: Recent Labs  Lab 09/22/18 1121 09/22/18 1541 09/22/18 2019 09/23/18 0750 09/23/18 1137  GLUCAP 127* 133* 174* 121* 252*   Lipid Profile: No results for input(s): CHOL, HDL, LDLCALC, TRIG, CHOLHDL, LDLDIRECT in the last 72 hours. Thyroid  Function Tests: No results for input(s): TSH, T4TOTAL, FREET4, T3FREE, THYROIDAB in the last 72 hours. Anemia Panel: No results for input(s): VITAMINB12, FOLATE, FERRITIN, TIBC, IRON, RETICCTPCT in the last 72 hours. Sepsis Labs: No results for input(s): PROCALCITON, LATICACIDVEN in the last 168 hours.  Recent Results (from the past 240 hour(s))  SARS Coronavirus 2 [facility] order, Performed in [facility] hospital lab) Nasopharyngeal Nasopharyngeal Swab     Status: None   Collection Time: 09/18/18  1:22 PM   Specimen: Nasopharyngeal Swab  Result Value Ref Range Status   SARS Coronavirus 2 NEGATIVE NEGATIVE Final    Comment: (NOTE) If result is NEGATIVE SARS-CoV-2 target nucleic acids are NOT DETECTED. The SARS-CoV-2 RNA is generally detectable in upper and lower  respiratory specimens during the acute phase of infection. The lowest  concentration of SARS-CoV-2 viral copies this assay can detect is 250  copies / mL. A negative result does not preclude SARS-CoV-2 infection  and should not be used as the sole basis for treatment or other  patient  management decisions.  A negative result may occur with  improper specimen collection / handling, submission of specimen other  than nasopharyngeal swab, presence of viral mutation(s) within the  areas targeted by this assay, and inadequate number of viral copies  (<250 copies / mL). A negative result must be combined with clinical  observations, patient history, and epidemiological information. If result is POSITIVE SARS-CoV-2 target nucleic acids are DETECTED. The SARS-CoV-2 RNA is generally detectable in upper and lower  respiratory specimens dur ing the acute phase of infection.  Positive  results are indicative of active infection with SARS-CoV-2.  Clinical  correlation with patient history and other diagnostic information is  necessary to determine patient infection status.  Positive results do  not rule out bacterial infection or co-infection with other viruses. If result is PRESUMPTIVE POSTIVE SARS-CoV-2 nucleic acids MAY BE PRESENT.   A presumptive positive result was obtained on the submitted specimen  and confirmed on repeat testing.  While 2019 novel coronavirus  (SARS-CoV-2) nucleic acids may be present in the submitted sample  additional confirmatory testing may be necessary for epidemiological  and / or clinical management purposes  to differentiate between  SARS-CoV-2 and other Sarbecovirus currently known to infect humans.  If clinically indicated additional testing with an alternate test  methodology [phone]) is advised. The SARS-CoV-2 RNA is generally  detectable in upper and lower respiratory sp ecimens during the acute  phase of infection. The expected result is Negative. Fact Sheet for Patients:  StrictlyIdeas.no Fact Sheet for Healthcare Providers: BankingDealers.co.za This test is not yet approved or cleared by the [place] FDA and has been authorized for detection and/or diagnosis of SARS-CoV-2 by FDA under  an Emergency Use Authorization (EUA).  This EUA will remain in effect (meaning this test can be used) for the duration of the COVID-19 declaration under Section 564(b)(1) of the Act, 21 U.S.C. section 360bbb-3(b)(1), unless the authorization is terminated or revoked sooner. Performed at [facility], [place] [address] [place], [place] [address]          Radiology Studies: No results found.      Scheduled Meds: . allopurinol  100 mg Oral Daily  . colchicine  0.6 mg Oral Daily  . feeding supplement (ENSURE ENLIVE)  237 mL Oral BID BM  . insulin aspart  0-5 Units Subcutaneous QHS  . insulin aspart  0-9 Units Subcutaneous TID WC  . mometasone-formoterol  2 puff  Inhalation BID  . morphine      . pantoprazole (PROTONIX) IV  40 mg Intravenous Q12H   Continuous Infusions: . cefTRIAXone (ROCEPHIN)  IV Stopped (09/22/18 1659)  . metronidazole 500 mg (09/23/18 0807)     LOS: 5 days    Time spent: 35 minutes.     Elmarie Shiley, MD [facility] Pager [phone]  If 7PM-7AM, please contact night-coverage [email or web address] Password [email or web address] 09/23/2018, 2:40 PM

## 2018-09-23 NOTE — Progress Notes (Signed)
Physical Therapy Treatment Patient Details Name: [name] MRN: [id number] DOB: [birth year] Today's Date: 09/23/2018    History of Present Illness 68 year old with past medical history significant for right colectomy, breast cancer, diabetes type 2, GERD, asthma, coronary artry disease and multiple other medical problems. admiitted with  worsening abdominal pain and episode of vomiting-->elevated liver enzymes, cholelithiasis with acute cholecystitis    PT Comments    Pt able to increase ambulation distance, but continues to walk on L lateral foot due to gout.  At this time, continue to recommend HHPT.   Follow Up Recommendations  Home health PT     Equipment Recommendations  None recommended by PT    Recommendations for Other Services       Precautions / Restrictions Precautions Precautions: Fall Precaution Comments: R foot gout Restrictions Weight Bearing Restrictions: No    Mobility  Bed Mobility Overal bed mobility: Needs Assistance Bed Mobility: Supine to Sit     Supine to sit: Supervision     General bed mobility comments: increased time  Transfers Overall transfer level: Needs assistance Equipment used: 4-wheeled walker Transfers: Sit to/from Stand Sit to Stand: Min guard         General transfer comment: min/guard for steadying  Ambulation/Gait Ambulation/Gait assistance: Min guard Gait Distance (Feet): 100 Feet Assistive device: 4-wheeled walker Gait Pattern/deviations: Decreased stance time - left;Step-to pattern;Decreased weight shift to left Gait velocity: decreased   General Gait Details: Pt walking on lateral L foot due to gout pain.  She denies pain, but grimacing at times.   Stairs             Wheelchair Mobility    Modified Rankin (Stroke Patients Only)       Balance             Standing balance-Leahy Scale: Poor Standing balance comment: reliant on UE support                             Cognition Arousal/Alertness: Awake/alert Behavior During Therapy: WFL for tasks assessed/performed Overall Cognitive Status: Within Functional Limits for tasks assessed                                        Exercises      General Comments        Pertinent Vitals/Pain Pain Assessment: Faces Faces Pain Scale: Hurts little more Pain Location: L foot with gait, but when asked denies pain Pain Descriptors / Indicators: Grimacing Pain Intervention(s): Monitored during session;Limited activity within patient's tolerance    Home Living                      Prior Function            PT Goals (current goals can now be found in the care plan section) Acute Rehab PT Goals Patient Stated Goal: no goal stated Progress towards PT goals: Progressing toward goals    Frequency    Min 3X/week      PT Plan Current plan remains appropriate    Co-evaluation              AM-PAC PT "6 Clicks" Mobility   Outcome Measure  Help needed turning from your back to your side while in a flat bed without using bedrails?: None Help needed moving from lying on your  back to sitting on the side of a flat bed without using bedrails?: A Little Help needed moving to and from a bed to a chair (including a wheelchair)?: A Little Help needed standing up from a chair using your arms (e.g., wheelchair or bedside chair)?: A Little Help needed to walk in hospital room?: A Little Help needed climbing 3-5 steps with a railing? : A Little 6 Click Score: 19    End of Session   Activity Tolerance: Patient tolerated treatment well Patient left: in chair;with call bell/phone within reach Nurse Communication: Mobility status PT Visit Diagnosis: Difficulty in walking, not elsewhere classified (R26.2);Pain Pain - Right/Left: Left Pain - part of body: Ankle and joints of foot     Time: 1036-1050 PT Time Calculation (min) (ACUTE ONLY): 14 min  Charges:  $Gait Training: 8-22  mins                     Cloud Graham L. Tamala Julian, [place] Pager [phone] 09/23/2018    [name] 09/23/2018, 11:29 AM

## 2018-09-24 DIAGNOSIS — K810 Acute cholecystitis: Secondary | ICD-10-CM

## 2018-09-24 LAB — COMPREHENSIVE METABOLIC PANEL
ALT: 53 U/L — ABNORMAL HIGH (ref 0–44)
AST: 20 U/L (ref 15–41)
Albumin: 3.2 g/dL — ABNORMAL LOW (ref 3.5–5.0)
Alkaline Phosphatase: 277 U/L — ABNORMAL HIGH (ref 38–126)
Anion gap: 6 (ref 5–15)
BUN: 17 mg/dL (ref 8–23)
CO2: 25 mmol/L (ref 22–32)
Calcium: 9.1 mg/dL (ref 8.9–10.3)
Chloride: 109 mmol/L (ref 98–111)
Creatinine, Ser: 1.59 mg/dL — ABNORMAL HIGH (ref 0.44–1.00)
GFR calc Af Amer: 38 mL/min — ABNORMAL LOW (ref >60)
GFR calc non Af Amer: 33 mL/min — ABNORMAL LOW (ref >60)
Glucose, Bld: 128 mg/dL — ABNORMAL HIGH (ref 70–99)
Potassium: 4.1 mmol/L (ref 3.5–5.1)
Sodium: 140 mmol/L (ref 135–145)
Total Bilirubin: 0.8 mg/dL (ref 0.3–1.2)
Total Protein: 7.8 g/dL (ref 6.5–8.1)

## 2018-09-24 LAB — CBC
HCT: 30.7 % — ABNORMAL LOW (ref 36.0–46.0)
Hemoglobin: 9.2 g/dL — ABNORMAL LOW (ref 12.0–15.0)
MCH: 30.4 pg (ref 26.0–34.0)
MCHC: 30 g/dL (ref 30.0–36.0)
MCV: 101.3 fL — ABNORMAL HIGH (ref 80.0–100.0)
Platelets: 316 10*3/uL (ref 150–400)
RBC: 3.03 MIL/uL — ABNORMAL LOW (ref 3.87–5.11)
RDW: 14.2 % (ref 11.5–15.5)
WBC: 5.3 10*3/uL (ref 4.0–10.5)
nRBC: 0 % (ref 0.0–0.2)

## 2018-09-24 LAB — GLUCOSE, CAPILLARY
Glucose-Capillary: 246 mg/dL — ABNORMAL HIGH (ref 70–99)
Glucose-Capillary: 207 mg/dL — ABNORMAL HIGH (ref 70–99)
Glucose-Capillary: 123 mg/dL — ABNORMAL HIGH (ref 70–99)
Glucose-Capillary: 210 mg/dL — ABNORMAL HIGH (ref 70–99)

## 2018-09-24 NOTE — Progress Notes (Signed)
PROGRESS NOTE  [name] LOV:[id number] DOB: 07-Jun-1950 DOA: 09/18/2018 PCP: Tsosie Billing, MD (Inactive)   Brief Narrative: 68 year old with past medical history significant for right colectomy, breast cancer, diabetes type 2, GERD, asthma, coronary artery disease and multiple other medical problems presents with abdominal pain patient has been having abdominal pain on and off for months.  She presented with worsening abdominal pain and episode of vomiting. Evaluation in the ED: Alkaline phosphatase was 645, AST 215, ALT 325, bilirubin 5.5. CT abdomen pelvis:There is cholelithiasis with diffuse gallbladder wall thickening and edema. There is loss of fat plane between the gallbladder and the distal stomach/proximal duodenum. 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 No air seen in the gallbladder.There is dilatation of the common hepatic and common bile ducts. No intrahepatic biliary duct dilatation seen. No biliary duct mass or calculus evident. A mass at the ampulla could cause these findings. From an imaging standpoint, MRCP would be the optimum study of choice to further evaluate in this regard. MRCP; Cholelithiasis with acute cholecystitis. Associated 2.8 cm cystic lesion along the wall the gallbladder favors localized perforation. Adherent proximal duodenum with mild wall thickening suggest involvement with the right upper quadrant inflammatory process. Direct fistulous communication is not demonstrated on MR.  Patient admitted with cholecystitis and obstructive jaundice.  Evaluated by surgery who is recommending medical treatment with IV antibiotics.  Advance diet as tolerated.    HPI/Recap of past 24 hours:  Feeling better, tolerating full liquid  Assessment/Plan: Active Problems:   Elevated liver enzymes   1-Cholecystitis, Transaminases, obstructive jaundice CT abdomen and pelvis with questionable cholecysto enteric fistula and  cholecystitis. MRCP negative for fistula, did show cholelithiasis with acute cholecystitis and Associated 2.8 cm cystic lesion along the wall the gallbladder favors localized perforation.  Continue with IV antibiotics, ceftriaxone, flagyl.  Continue with dilaudid PRN HIDA scan positive for cholecystis  [place] with gallstone , contracted gallbladder. IR and surgery recommend against percutaneous drainage.  Plan to treat with IV antibiotics. Continue with medical management, appreciate sx.  LFT trending down.  Plan is to  advance diet.  When patient is stable enough plan is to discharge home on oral antibiotics and follow-up with surgery in 6 to 8 weeks to have  cholecystectomy.  Anemia: Positive occult blood: Follow hemoglobin.  Continue with PPI Hemoglobin remained stable.  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 Sonny Dandy. On letrozole and Herceptin.   Gout; flare Start colchicine and allopurinol..   HTN; resume Norvasc and metoprolol Will order PRN hydralazine  Coronary artery disease a status post a stent 2013: Hold aspirin and Lipitor well n.p.o.  Chronic kidney disease a stage IV creatinine lower than recent baseline of 2.2. Stop fluids. Patient on clear diet.  Remained stable Asthma; continue with Symbicort and scheduled nebulizer GERD continue with PPI.   DM; on SSI.    Estimated body mass index is 26.36 kg/m as calculated from the following:   Height as of this encounter: 4\' 11"  (1.499 m).   Weight as of this encounter: 59.2 kg.  Tele unremarkable, clinically improving, will d/c tele, transfer to med surg bed   DVT prophylaxis: SCDs Code Status: Full code Family Communication: Care discussed with patient and sister over the phone on 8/17 Disposition Plan: possible home on 8/20 if tolerating diet advancement , need general surgery clearance Consultants:   General  surgery  GI  Procedures:   Right upper quadrant  ultrasound   Antibiotics:  As above   Objective: BP (!) 153/63 (BP Location: Right Arm)   Pulse 71   Temp 98.8 F (37.1 C) (Oral)   Resp 18   Ht 4\' 11"  (1.499 m)   Wt 59.7 kg   SpO2 96%   BMI 26.58 kg/m   Intake/Output Summary (Last 24 hours) at 09/24/2018 1733 Last data filed at 09/24/2018 1130 Gross per 24 hour  Intake 860 ml  Output -  Net 860 ml   Filed Weights   09/21/18 0558 09/23/18 0529 09/24/18 0500  Weight: 59.6 kg 59.2 kg 59.7 kg    Exam: Patient is examined daily including today on 09/24/2018, exams remain the same as of yesterday except that has changed    General:  NAD, sitting up in chair, aaox3  Cardiovascular: RRR  Respiratory: CTABL  Abdomen: Soft/ND/NT, positive BS  Musculoskeletal: No Edema  Neuro: alert, oriented   Data Reviewed: Basic Metabolic Panel: Recent Labs  Lab 09/20/18 0354 09/21/18 0322 09/22/18 0333 09/23/18 0943 09/24/18 0335  NA 139 139 139 140 140  K 3.7 3.8 4.0 3.5 4.1  CL 106 107 107 111 109  CO2 23 21* 26 22 25   GLUCOSE 93 128* 126* 235* 128*  BUN 23 17 12 12 17   CREATININE 1.76* 1.60* 1.60* 1.48* 1.59*  CALCIUM 9.1 9.2 8.9 8.3* 9.1   Liver Function Tests: Recent Labs  Lab 09/20/18 0354 09/21/18 0322 09/22/18 0333 09/23/18 0943 09/24/18 0335  AST 55* 32 26 23 20   ALT 160* 117* 86* 60* 53*  ALKPHOS 510* 420* 353* 289* 277*  BILITOT 1.5* 1.0 0.9 0.8 0.8  PROT 7.8 7.7 7.4 7.1 7.8  ALBUMIN 3.2* 3.1* 3.0* 2.9* 3.2*   Recent Labs  Lab 09/18/18 1216  LIPASE 44   No results for input(s): AMMONIA in the last 168 hours. CBC: Recent Labs  Lab 09/18/18 1216  09/20/18 0354 09/20/18 1003 09/21/18 0322 09/22/18 0333 09/23/18 0943 09/24/18 0335  WBC 7.3   < > 6.0  --  6.4 5.7 6.5 5.3  NEUTROABS 5.4  --  3.8  --   --   --   --   --   HGB 11.0*   < > 9.4* 10.0* 9.2* 9.0* 9.5* 9.2*  HCT 35.1*   < > 30.7* 32.7* 30.4* 29.5* 31.1* 30.7*  MCV 99.2    < > 99.0  --  100.0 101.7* 100.6* 101.3*  PLT 380   < > 323  --  301 288 287 316   < > = values in this interval not displayed.   Cardiac Enzymes:   No results for input(s): CKTOTAL, CKMB, CKMBINDEX, TROPONINI in the last 168 hours. BNP (last 3 results) Recent Labs    07/12/18 1030  BNP 25.3    ProBNP (last 3 results) No results for input(s): PROBNP in the last 8760 hours.  CBG: Recent Labs  Lab 09/23/18 1706 09/23/18 2119 09/24/18 0744 09/24/18 1137 09/24/18 1624  GLUCAP 108* 166* 246* 207* 123*    Recent Results (from the past 240 hour(s))  SARS Coronavirus 2 [facility] order, Performed in [facility] hospital lab) Nasopharyngeal Nasopharyngeal Swab     Status: None   Collection Time: 09/18/18  1:22 PM   Specimen: Nasopharyngeal Swab  Result Value Ref Range Status   SARS Coronavirus 2 NEGATIVE NEGATIVE Final    Comment: (NOTE) If result is NEGATIVE SARS-CoV-2 target nucleic acids are NOT DETECTED. The SARS-CoV-2 RNA is generally detectable in upper and  lower  respiratory specimens during the acute phase of infection. The lowest  concentration of SARS-CoV-2 viral copies this assay can detect is 250  copies / mL. A negative result does not preclude SARS-CoV-2 infection  and should not be used as the sole basis for treatment or other  patient management decisions.  A negative result may occur with  improper specimen collection / handling, submission of specimen other  than nasopharyngeal swab, presence of viral mutation(s) within the  areas targeted by this assay, and inadequate number of viral copies  (<250 copies / mL). A negative result must be combined with clinical  observations, patient history, and epidemiological information. If result is POSITIVE SARS-CoV-2 target nucleic acids are DETECTED. The SARS-CoV-2 RNA is generally detectable in upper and lower  respiratory specimens dur ing the acute phase of infection.  Positive  results are indicative of active  infection with SARS-CoV-2.  Clinical  correlation with patient history and other diagnostic information is  necessary to determine patient infection status.  Positive results do  not rule out bacterial infection or co-infection with other viruses. If result is PRESUMPTIVE POSTIVE SARS-CoV-2 nucleic acids MAY BE PRESENT.   A presumptive positive result was obtained on the submitted specimen  and confirmed on repeat testing.  While 2019 novel coronavirus  (SARS-CoV-2) nucleic acids may be present in the submitted sample  additional confirmatory testing may be necessary for epidemiological  and / or clinical management purposes  to differentiate between  SARS-CoV-2 and other Sarbecovirus currently known to infect humans.  If clinically indicated additional testing with an alternate test  methodology [phone]) is advised. The SARS-CoV-2 RNA is generally  detectable in upper and lower respiratory sp ecimens during the acute  phase of infection. The expected result is Negative. Fact Sheet for Patients:  StrictlyIdeas.no Fact Sheet for Healthcare Providers: BankingDealers.co.za This test is not yet approved or cleared by the [place] FDA and has been authorized for detection and/or diagnosis of SARS-CoV-2 by FDA under an Emergency Use Authorization (EUA).  This EUA will remain in effect (meaning this test can be used) for the duration of the COVID-19 declaration under Section 564(b)(1) of the Act, 21 U.S.C. section 360bbb-3(b)(1), unless the authorization is terminated or revoked sooner. Performed at [facility], [place] [address], [place], [place] [address]      Studies: No results found.  Scheduled Meds: . allopurinol  100 mg Oral Daily  . amLODipine  5 mg Oral Daily  . colchicine  0.6 mg Oral Daily  . feeding supplement (ENSURE ENLIVE)  237 mL Oral BID BM  . insulin aspart  0-9 Units Subcutaneous TID WC  .  metoprolol succinate  50 mg Oral QHS  . mometasone-formoterol  2 puff Inhalation BID  . morphine      . pantoprazole (PROTONIX) IV  40 mg Intravenous Q12H    Continuous Infusions: . cefTRIAXone (ROCEPHIN)  IV 2 g (09/24/18 1625)  . metronidazole 500 mg (09/24/18 1717)     Time spent: 67mins I have personally reviewed and interpreted on  09/24/2018 daily labs, tele strips, imagings as discussed above under date review session and assessment and plans.  I reviewed all nursing notes, pharmacy notes, consultant notes,  vitals, pertinent old records  I have discussed plan of care as described above with RN , patient  on 09/24/2018   Florencia Reasons MD, PhD, FACP  [facility] Pager [phone]. If 7PM-7AM, please contact night-coverage at [email or web address], password [facility] 09/24/2018, 5:33 PM  LOS: 6 days

## 2018-09-24 NOTE — Progress Notes (Signed)
Occupational Therapy Treatment Patient Details Name: [name] MRN: [id number] DOB: [birth year] Today's Date: 09/24/2018    History of present illness 68 year old with past medical history significant for right colectomy, breast cancer, diabetes type 2, GERD, asthma, coronary artry disease and multiple other medical problems. admiitted with  worsening abdominal pain and episode of vomiting-->elevated liver enzymes, cholelithiasis with acute cholecystitis   OT comments  Pt agreeable to OOB and simple ADL activity.    Follow Up Recommendations  No OT follow up          Precautions / Restrictions Precautions Precautions: Fall Precaution Comments: R foot gout Restrictions Weight Bearing Restrictions: No       Mobility Bed Mobility Overal bed mobility: Needs Assistance Bed Mobility: Supine to Sit     Supine to sit: Supervision     General bed mobility comments: increased time  Transfers Overall transfer level: Needs assistance Equipment used: 4-wheeled walker Transfers: Sit to/from Stand Sit to Stand: Min guard         General transfer comment: min/guard for steadying    Balance Overall balance assessment: Mild deficits observed, not formally tested                                         ADL either performed or assessed with clinical judgement   ADL Overall ADL's : Needs assistance/impaired Eating/Feeding: Set up;Sitting   Grooming: Set up;Sitting   Upper Body Bathing: Set up;Sitting   Lower Body Bathing: Minimal assistance;Sit to/from stand;Cueing for safety   Upper Body Dressing : Set up;Sitting   Lower Body Dressing: Minimal assistance;Sit to/from stand   Toilet Transfer: Min guard;Comfort height toilet;Ambulation;Stand-pivot;Cueing for safety   Toileting- Clothing Manipulation and Hygiene: Min guard;Sit to/from stand;Cueing for safety;Cueing for sequencing                         Cognition Arousal/Alertness:  Awake/alert Behavior During Therapy: WFL for tasks assessed/performed Overall Cognitive Status: Within Functional Limits for tasks assessed                                                     Pertinent Vitals/ Pain       Pain Score: 2  Pain Location: L foor Pain Descriptors / Indicators: Sore  Home Living Family/patient expects to be discharged to:: Private residence Living Arrangements: Children;Other relatives Available Help at Discharge: Available 24 hours/day Type of Home: House Home Access: Stairs to enter [employer] of Steps: 4 Entrance Stairs-Rails: Right Home Layout: One level     Bathroom Shower/Tub: Teacher, early years/pre: Standard     Home Equipment: Cane - single point;Walker - 4 wheels;Shower seat;Bedside commode   Additional Comments: pt lives with son and grandson, she states they assist as needed (previous notes state pt has aide 5x/wk)      Prior Functioning/Environment Level of Independence: Independent with assistive device(s)        Comments: amb with rollator   Frequency  Min 1X/week        Progress Toward Goals  OT Goals(current goals can now be found in the care plan section)  Progress towards OT goals: Progressing toward goals     Plan Discharge  plan remains appropriate       AM-PAC OT "6 Clicks" Daily Activity     Outcome Measure   Help from another person eating meals?: None Help from another person taking care of personal grooming?: None Help from another person toileting, which includes using toliet, bedpan, or urinal?: A Little Help from another person bathing (including washing, rinsing, drying)?: A Little Help from another person to put on and taking off regular upper body clothing?: None Help from another person to put on and taking off regular lower body clothing?: A Little 6 Click Score: 21    End of Session Equipment Utilized During Treatment: Gait belt;Rolling walker  OT  Visit Diagnosis: Unsteadiness on feet (R26.81);Pain Pain - part of body: (stomach and L foot)   Activity Tolerance Patient limited by pain   Patient Left in chair;with call bell/phone within reach   Nurse Communication Mobility status        Time: 0243-0255 OT Time Calculation (min): 12 min  Charges: OT General Charges $OT Visit: 1 Visit OT Treatments $Self Care/Home Management : 8-22 mins  Kari Baars, [place] [phone] Office- [phone]      Piffard, Edwena Felty D 09/24/2018, 3:54 PM

## 2018-09-24 NOTE — Progress Notes (Signed)
[facility] Office:  [phone] General Surgery Progress Note   LOS: 6 days  POD -     Chief Complaint: Abdominal pian  Assessment and Plan: 1.  Cholecystitis/cholelithiasis  On Rocephin/flagyl   Cannot perc drain.  Poor surgical candidate at this time.  She is a high risk candidate for a lap chole currently with risk of bile duct or bowel injury.  We can see her in 6 to 8 weeks to consider interval cholecystectomy.  Diet as tolerated   2.  History of right breast cancer - Her2Neu positive  Right mastectomy - 11/27/2017 - [facility]  On herceptin by Dr. Lindi Adie 3.  CAD 4.  CVA 5.  DM 6.  CKD - Creatinine - 1.59 - 09/24/2018 7.  Anemia   Hgb - 9.2 - 09/24/2018 8. DVT prophylaxis - on no chemoprophylaxis   Active Problems:   Elevated liver enzymes Total bilirubin normal today  Subjective:  Feels okay.  Tolerating diet.  Denies RUQ or MEG abdominal pain  Objective:   Vitals:   09/24/18 0514 09/24/18 0727  BP: 121/68   Pulse: 60   Resp: 16   Temp: 98.1 F (36.7 C)   SpO2: 95% 96%     Intake/Output from previous day:  08/18 0701 - 08/19 0700 In: 860 [P.O.:360; IV Piggyback:500] Out: -   Intake/Output this shift:  No intake/output data recorded.   Physical Exam:   General: WN older AA F who is alert and oriented.    HEENT: Normal. Pupils equal. .   Lungs: Clear.  Port in left upper chest.   Abdomen: Distended, but no localized tenderness.  She has a midline incision which is unexplained.   Lab Results:    Recent Labs    09/23/18 0943 09/24/18 0335  WBC 6.5 5.3  HGB 9.5* 9.2*  HCT 31.1* 30.7*  PLT 287 316    BMET   Recent Labs    09/23/18 0943 09/24/18 0335  NA 140 140  K 3.5 4.1  CL 111 109  CO2 22 25  GLUCOSE 235* 128*  BUN 12 17  CREATININE 1.48* 1.59*  CALCIUM 8.3* 9.1    PT/INR  No results for input(s): LABPROT, INR in the last 72 hours.  ABG  No results for input(s): PHART, HCO3 in the last 72 hours.  Invalid input(s):  PCO2, PO2   Studies/Results:  No results found.   Anti-infectives:   Anti-infectives (From admission, onward)   Start     Dose/Rate Route Frequency Ordered Stop   09/19/18 1600  cefTRIAXone (ROCEPHIN) 2 g in sodium chloride 0.9 % 100 mL IVPB     2 g 200 mL/hr over 30 Minutes Intravenous Every 24 hours 09/18/18 1625     09/18/18 1600  metroNIDAZOLE (FLAGYL) IVPB 500 mg     500 mg 100 mL/hr over 60 Minutes Intravenous Every 8 hours 09/18/18 1516     09/18/18 1515  cefTRIAXone (ROCEPHIN) 2 g in sodium chloride 0.9 % 100 mL IVPB     2 g 200 mL/hr over 30 Minutes Intravenous  Once 09/18/18 1505 09/18/18 [place]. Dema Severin, M.D. [facility]

## 2018-09-25 ENCOUNTER — Inpatient Hospital Stay: Payer: Medicare Other

## 2018-09-25 ENCOUNTER — Inpatient Hospital Stay: Payer: Medicare Other | Admitting: Hematology and Oncology

## 2018-09-25 ENCOUNTER — Telehealth: Payer: Self-pay | Admitting: Hematology and Oncology

## 2018-09-25 DIAGNOSIS — N1832 Chronic kidney disease, stage 3b: Secondary | ICD-10-CM

## 2018-09-25 DIAGNOSIS — N183 Chronic kidney disease, stage 3 unspecified: Secondary | ICD-10-CM

## 2018-09-25 DIAGNOSIS — Z794 Long term (current) use of insulin: Secondary | ICD-10-CM

## 2018-09-25 DIAGNOSIS — D539 Nutritional anemia, unspecified: Secondary | ICD-10-CM

## 2018-09-25 DIAGNOSIS — E119 Type 2 diabetes mellitus without complications: Secondary | ICD-10-CM

## 2018-09-25 LAB — COMPREHENSIVE METABOLIC PANEL
ALT: 42 U/L (ref 0–44)
AST: 18 U/L (ref 15–41)
Albumin: 3 g/dL — ABNORMAL LOW (ref 3.5–5.0)
Alkaline Phosphatase: 255 U/L — ABNORMAL HIGH (ref 38–126)
Anion gap: 7 (ref 5–15)
BUN: 24 mg/dL — ABNORMAL HIGH (ref 8–23)
CO2: 24 mmol/L (ref 22–32)
Calcium: 9.1 mg/dL (ref 8.9–10.3)
Chloride: 108 mmol/L (ref 98–111)
Creatinine, Ser: 1.67 mg/dL — ABNORMAL HIGH (ref 0.44–1.00)
GFR calc Af Amer: 36 mL/min — ABNORMAL LOW (ref >60)
GFR calc non Af Amer: 31 mL/min — ABNORMAL LOW (ref >60)
Glucose, Bld: 106 mg/dL — ABNORMAL HIGH (ref 70–99)
Potassium: 4 mmol/L (ref 3.5–5.1)
Sodium: 139 mmol/L (ref 135–145)
Total Bilirubin: 0.6 mg/dL (ref 0.3–1.2)
Total Protein: 7.5 g/dL (ref 6.5–8.1)

## 2018-09-25 LAB — GLUCOSE, CAPILLARY
Glucose-Capillary: 143 mg/dL — ABNORMAL HIGH (ref 70–99)
Glucose-Capillary: 247 mg/dL — ABNORMAL HIGH (ref 70–99)

## 2018-09-25 MED ORDER — AMOXICILLIN-POT CLAVULANATE 500-125 MG PO TABS: 1.0000 | ORAL_TABLET | Freq: Two times a day (BID) | ORAL | 0 refills | Status: AC

## 2018-09-25 MED ORDER — AMOXICILLIN-POT CLAVULANATE 500-125 MG PO TABS
1.0000 | ORAL_TABLET | Freq: Two times a day (BID) | ORAL | Status: DC
  Administered 2018-09-25: 500 mg via ORAL
  Filled 2018-09-25 (×2): qty 1

## 2018-09-25 MED ORDER — HEPARIN SOD (PORK) LOCK FLUSH 100 UNIT/ML IV SOLN
500.0000 [IU] | INTRAVENOUS | Status: AC | PRN
  Administered 2018-09-25: 16:00:00 500 [IU]

## 2018-09-25 MED ORDER — SACCHAROMYCES BOULARDII 250 MG PO CAPS: 250.0000 mg | ORAL_CAPSULE | Freq: Two times a day (BID) | ORAL | 0 refills | Status: AC

## 2018-09-25 MED ORDER — SACCHAROMYCES BOULARDII 250 MG PO CAPS: 250.0000 mg | ORAL_CAPSULE | Freq: Two times a day (BID) | ORAL | Status: DC

## 2018-09-25 NOTE — Discharge Summary (Signed)
Discharge Summary  [name] KZS:[id number] DOB: 08/13/1950  PCP: Tsosie Billing, MD (Inactive)  Admit date: 09/18/2018 Discharge date: 09/25/2018  Time spent: 97mins, more than 50% time spent on coordination of care.  Recommendations for Outpatient Follow-up:  1. F/u with PCP within a week  for hospital discharge follow up, repeat cbc/bmp at follow up. 2. F/u with general surgery for gallbladder surgery  Discharge Diagnoses:  Active Hospital Problems   Diagnosis Date Noted  . Acute cholecystitis   . Elevated liver enzymes 09/18/2018    Resolved Hospital Problems  No resolved problems to display.    Discharge Condition: stable  Diet recommendation: heart healthy/carb modified  Filed Weights   09/23/18 0529 09/24/18 0500 09/25/18 0500  Weight: 59.2 kg 59.7 kg 58.6 kg    History of present illness: ( per admitting MD Dr Florene Glen) PCP: Tsosie Billing, MD (Inactive) (Confirm with patient/family/NH records and if not entered, this has to be entered at [facility] point of entry) Patient coming from: home  I have personally briefly reviewed patient's old medical records in [place]  Chief Complaint: abdominal pain, blood in stool  HPI: [name] is a 68 y.o. female with medical history significant of right colectomy, breast cancer, T2DM, GERD, asthma, CAD, and multiple other medical problems presenting with abdominal pain.    She notes that she came to hospital today due to abdominal pain.  Describes as "pain in stomach".  Comes and goes.  Present for "months".  She denies fevers, chills, CP, SOB.  Notes 1 episode of emesis last night.  She noted loose stool which her doctor noted had blood in it and she was directed to the ED.  ED Course: Labs, imaging.  GI and surgery c/s.  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     Hospital Course:  Active Problems:   Elevated liver enzymes   Acute  cholecystitis   Cholecystitis, Transaminases, obstructive jaundice -CT abdomen and pelvis with questionable cholecysto enteric fistula and cholecystitis. -MRCP negative for fistula, did show cholelithiasis with acute cholecystitis and Associated 2.8 cm cystic lesion along the wall the gallbladder favors localized perforation.  -HIDA scan positive for cholecystis ,[place] with gallstone , contracted gallbladder. IR and surgery recommend against percutaneous drainage.  -she is treated with IV antibiotics, ceftriaxone, flagyl, she is discharged on augmentin for three more days after discussion with general surgery -ab pain resolve, she is tolerating diet advancement, lft trending down -she is cleared to go home by general surgery on oral antibiotics and follow-up with surgery in 6 to 8 weeks to have cholecystectomy.  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 Sonny Dandy. On letrozole and Herceptin.   Gout; flare Start colchicine and allopurinol. Resolved, f/u with pcp  HTN;resume Norvasc and metoprolol bp stable   Insulin dependent DM; Last a1c  5.5 from 07/2018 on SSI in the hospital Resume home meds lantus 10utnis daily, f/u with pcp  Coronary artery diseasea status post a stent 2013: Hold aspirin and Lipitor well n.p.o.  Chronic kidney disease a stage IV creatinine lower than recent baseline of 2.2. Stop fluids. Patient on clear diet. Remained stable  Asthma; continue with Symbicort and scheduled nebulizer    Estimated body mass index is 26.36 kg/m as calculated from the following: Height as  of this encounter: 4\' 11"  (1.499 m). Weight as of this encounter: 59.2 kg.     DVT prophylaxis:SCDs Code Status:Full code Family Communication: Care discussed with patientand sister over the phone on  8/17 Disposition Plan: home on 8/20 with  general surgery clearance Consultants:  General surgery  GI  Procedures:  Right upper quadrant ultrasound   Antibiotics:  As above   Discharge Exam: BP 134/70 (BP Location: Left Arm)   Pulse 61   Temp 97.8 F (36.6 C) (Oral)   Resp 16   Ht 4\' 11"  (1.499 m)   Wt 58.6 kg   SpO2 98%   BMI 26.09 kg/m   General: NAD, AAOx3 Cardiovascular: RRR Respiratory: CATBL Abdomen: Soft/ND/NT, positive BS Musculoskeletal: No Edema Neuro: alert, oriented   Discharge Instructions You were cared for by a hospitalist during your hospital stay. If you have any questions about your discharge medications or the care you received while you were in the hospital after you are discharged, you can call the unit and asked to speak with the hospitalist on call if the hospitalist that took care of you is not available. Once you are discharged, your primary care physician will handle any further medical issues. Please note that NO REFILLS for any discharge medications will be authorized once you are discharged, as it is imperative that you return to your primary care physician (or establish a relationship with a primary care physician if you do not have one) for your aftercare needs so that they can reassess your need for medications and monitor your lab values.  Discharge Instructions    Diet general   Complete by: As directed    Low fat, carb modified diet   Increase activity slowly   Complete by: As directed      Allergies as of 09/25/2018   No Known Allergies     Medication List    STOP taking these medications   lactulose 10 GM/15ML solution Commonly known as: CHRONULAC   pantoprazole 40 MG tablet Commonly known as: PROTONIX     TAKE these medications   acetaminophen 325 MG tablet Commonly known as: TYLENOL Take 2 tablets (650 mg total) by mouth every 4 (four) hours as needed for headache or mild pain.   albuterol (2.5 MG/3ML)  0.083% nebulizer solution Commonly known as: PROVENTIL INHALE THE CONTENTS OF 1 VIAL USING NEBULIZER EVERY 6 HOURS AS NEEDEDFOR WHEEZING OR SHORTNESS OF BREATH What changed: See the new instructions.   allopurinol 100 MG tablet Commonly known as: ZYLOPRIM Take 100 mg by mouth daily.   ALPRAZolam 0.25 MG tablet Commonly known as: XANAX Take 0.25 mg by mouth daily.   amLODipine 5 MG tablet Commonly known as: NORVASC Take 5 mg by mouth daily.   amoxicillin-clavulanate 500-125 MG tablet Commonly known as: AUGMENTIN Take 1 tablet (500 mg total) by mouth 2 (two) times daily for 3 days.   anastrozole 1 MG tablet Commonly known as: ARIMIDEX Take 1 tablet (1 mg total) by mouth daily.   aspirin 81 MG EC tablet Take 1 tablet (81 mg total) by mouth daily.   atorvastatin 20 MG tablet Commonly known as: LIPITOR Take 20 mg by mouth daily.   budesonide-formoterol 160-4.5 MCG/ACT inhaler Commonly known as: SYMBICORT Inhale 1 puff into the lungs 2 (two) times daily.   colchicine 0.6 MG tablet Take 0.6 mg by mouth daily as needed for pain.   feeding supplement (ENSURE ENLIVE) Liqd Take 237 mLs by mouth 2 (two) times daily between  meals.   Fish Oil 1000 MG Caps Take 1,000 mg by mouth daily.   Lantus SoloStar 100 UNIT/ML Solostar Pen Generic drug: Insulin Glargine Inject 10 Units into the skin daily.   loperamide 2 MG capsule Commonly known as: IMODIUM Take 1 capsule (2 mg total) by mouth 4 (four) times daily as needed for diarrhea or loose stools.   metoprolol succinate 50 MG 24 hr tablet Commonly known as: TOPROL-XL Take 50 mg by mouth at bedtime. Take with or immediately following a meal.   nitroGLYCERIN 0.4 MG SL tablet Commonly known as: NITROSTAT Place 1 tablet (0.4 mg total) under the tongue every 5 (five) minutes as needed. For chest pain What changed:   reasons to take this  additional instructions   ondansetron 8 MG tablet Commonly known as: ZOFRAN Take 1  tablet (8 mg total) by mouth every 8 (eight) hours as needed for nausea or vomiting.   saccharomyces boulardii 250 MG capsule Commonly known as: FLORASTOR Take 1 capsule (250 mg total) by mouth 2 (two) times daily for 15 days.   traMADol 50 MG tablet Commonly known as: ULTRAM Take 50-100 mg by mouth every 6 (six) hours as needed for pain.      No Known Allergies Follow-up Information    Coralie Keens, MD Follow up in 6 week(s).   Specialty: General Surgery Why: our office will call you with appointment date and time Contact information: [address] [place] [address] [phone]        Tsosie Billing, MD Follow up in 1 week(s).   Specialty: Internal Medicine Why: hospital discharge follow up, repeat cbc/ bmp at follow up. Contact information: [place] [address] [phone]        Sonia Side., FNP Follow up.   Specialty: Family Medicine Why: hospital discharge follow up, pcp to repeat cbc/bmp at hospital discharge follow up. pcp to follow up on anemia, refer to GI Dr Lorayne Bender is needed Contact information: [address] [place] [address] [phone]        Gatha Mayer, MD Follow up.   Specialty: Gastroenterology Why: as needed  Contact information: [address] [place] [address] [phone]            The results of significant diagnostics from this hospitalization (including imaging, microbiology, ancillary and laboratory) are listed below for reference.    Significant Diagnostic Studies: Nm Hepatobiliary Liver Func  Result Date: 09/19/2018 CLINICAL DATA:  Abdominal pain, nausea and vomiting. Elevated bilirubin level. EXAM: NUCLEAR MEDICINE HEPATOBILIARY IMAGING TECHNIQUE: Sequential images of the abdomen were obtained out to 60 minutes following intravenous administration of radiopharmaceutical. RADIOPHARMACEUTICALS:  7.1 mCi Tc-13m  Choletec IV COMPARISON:  MRI 09/18/2018 and  CT AP 09/18/2018 FINDINGS: Prompt uptake and biliary excretion of activity by the liver is seen. Biliary activity passes into small bowel, consistent with patent common bile duct. After 1 hour of imaging no gallbladder activity identified. The patient was then given 2.4 mg of morphine sulfate, IV. Imaging was then subsequently performed for an additional 30 minutes without visualization of the gallbladder. IMPRESSION: 1. Nonvisualization of the gallbladder compatible with cystic duct obstruction which may be secondary to acute cholecystitis. 2. Patent common bile duct. Electronically Signed   By: Kerby Moors M.D.   On: 09/19/2018 15:25   Ct Abdomen Pelvis W Contrast  Result Date: 09/18/2018 CLINICAL DATA:  Abdominal pain and distension. History of breast carcinoma EXAM: CT ABDOMEN AND  PELVIS WITH CONTRAST TECHNIQUE: Multidetector CT imaging of the abdomen and pelvis was performed using the standard protocol following bolus administration of intravenous contrast. CONTRAST:  64mL OMNIPAQUE IOHEXOL 300 MG/ML  SOLN COMPARISON:  December 18, 2017 FINDINGS: Lower chest: There is bibasilar atelectasis. There is mild bronchiectatic change in the lung bases as well. There are foci of coronary artery calcification. Hepatobiliary: There is hepatic steatosis. No focal liver lesions are evident. There is cholelithiasis. The gallbladder wall is diffusely thickened and edematous. There is loss of fat plane between the gallbladder and the pylorus/first portion of the duodenum. There is no air within the gallbladder. There is no appreciable intrahepatic biliary duct dilatation. There is dilatation of the distal common bile duct, measuring 1.2 cm. No biliary duct mass or calculus evident. Pancreas: No pancreatic mass or inflammatory focus evident. No pancreatic duct dilatation. Spleen: No splenic lesions are evident. Adrenals/Urinary Tract: Adrenals bilaterally appear normal. There are subcentimeter cysts in the right kidney.  There is an extrarenal pelvis on the right, an anatomic variant. There is no hydronephrosis on either side. There is or ureteral calculus on either side. Urinary bladder wall thickness is within normal limits. Stomach/Bowel: As noted above, there is loss of fat plane between the gallbladder and the pylorus/first portion of the duodenum. There is wall thickening in the distal stomach and proximal duodenum. There is no other appreciable bowel wall thickening. The patient has had a partial right colectomy with anastomosis patent in this area. Terminal ileum appears unremarkable. No evident bowel obstruction. There is no free air or portal venous air. Vascular/Lymphatic: There is aortic and iliac artery atherosclerosis. No aneurysm evident. Major mesenteric arterial vessels appear patent, although there is severe narrowing at the origin of the right renal artery and extensive calcification with severe narrowing in the proximal left renal artery. No adenopathy is evident in the abdomen or pelvis. Reproductive: There are calcifications in the uterus, felt to be indicative of leiomyomatous change. The uterus is slightly canted toward the right. No pelvic mass is demonstrable. Other: Appendix absent. There is no abscess or ascites evident in the abdomen or pelvis. There is fat in each inguinal ring. Musculoskeletal: There are no blastic or lytic bone lesions. There is bony hypertrophy at L4 and diffuse disc protrusion. These findings in concert leads spinal stenosis. Similar changes are noted at L3-4 with spinal stenosis also present at L3-4. No blastic or lytic bone lesions. No intramuscular lesions are evident. IMPRESSION: 1. There is cholelithiasis with diffuse gallbladder wall thickening and edema. There is loss of fat plane between the gallbladder and the distal stomach/proximal duodenum. 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 No air seen in the gallbladder. 2. There is dilatation of the common  hepatic and common bile ducts. No intrahepatic biliary duct dilatation seen. No biliary duct mass or calculus evident. A mass at the ampulla could cause these findings. From an imaging standpoint, MRCP would be the optimum study of choice to further evaluate in this regard. 3.  Hepatic steatosis.  No focal liver lesions appreciable. 4. No bowel obstruction. No abscess in the abdomen or pelvis. The patient is status post partial right colectomy with anastomosis patent. 5. Spinal stenosis at L3-4 and L4-5 due to disc protrusions and bony hypertrophy at these levels. 6. Extensive calcification and narrowing in the proximal renal arteries bilaterally. Question whether patient is hypertensive; there may well be renovascular hypertension given this appearance. 7.  Extensive aortic and iliac artery atherosclerosis. 8. Calcifications in the  uterus consistent with leiomyomatous change. Electronically Signed   By: Lowella Grip III M.D.   On: 09/18/2018 14:26   Mr Abdomen Mrcp Wo Contrast  Result Date: 09/18/2018 CLINICAL DATA:  Abdominal pain/distension, Hemoccult-positive, history of breast cancer status post right mastectomy EXAM: MRI ABDOMEN WITHOUT CONTRAST  (INCLUDING MRCP) TECHNIQUE: Multiplanar multisequence MR imaging of the abdomen was performed. Heavily T2-weighted images of the biliary and pancreatic ducts were obtained, and three-dimensional MRCP images were rendered by post processing. COMPARISON:  CT abdomen/pelvis dated 09/18/2018 FINDINGS: Motion degraded images. Lower chest: Lung bases are clear. Hepatobiliary: Suspected iron deposition in the liver. No focal hepatic lesion is seen. Cholelithiasis with gallbladder wall thickening/inflammatory changes. Associated 1.9 x 2.8 cm cystic lesion along the wall of the gallbladder (series 8/image 26) favors localized perforation. No intrahepatic or extrahepatic ductal dilatation. Pancreas:  Within normal limits. Spleen:  Within normal limits. Adrenals/Urinary  Tract:  Adrenal glands are within normal limits. 8 mm posterior right upper pole renal cyst with mild right renal cortical scarring. Left kidney is within normal limits. No hydronephrosis. Stomach/Bowel: Stomach is within normal limits. Proximal duodenum is mildly thick-walled and adherent to the gallbladder (series 8/image 27). While direct fistulous communication is not demonstrated on MR, this may reflect involvement of the inflammatory process. Visualized bowel is otherwise unremarkable. Vascular/Lymphatic:  No evidence of abdominal aortic aneurysm. No suspicious abdominal lymphadenopathy. Other:  No abdominal ascites. Musculoskeletal: No focal osseous lesions. IMPRESSION: Cholelithiasis with acute cholecystitis. Associated 2.8 cm cystic lesion along the wall the gallbladder favors localized perforation. Adherent proximal duodenum with mild wall thickening suggest involvement with the right upper quadrant inflammatory process. Direct fistulous communication is not demonstrated on MR. Additional ancillary findings as above. Electronically Signed   By: Julian Hy M.D.   On: 09/18/2018 22:04   Dg Abdomen Acute W/chest  Result Date: 09/18/2018 CLINICAL DATA:  Acute generalized abdominal pain. EXAM: DG ABDOMEN ACUTE W/ 1V CHEST COMPARISON:  Radiographs of April 10, 2018 and February 27, 2015. FINDINGS: There is no evidence of dilated bowel loops or free intraperitoneal air. No radiopaque calculi or other significant radiographic abnormality is seen. Stable cardiomediastinal silhouette is noted. Atherosclerosis of thoracic aorta is noted. Left internal jugular Port-A-Cath is noted in grossly good position. Both lungs are clear. IMPRESSION: No evidence of bowel obstruction or ileus. No acute cardiopulmonary disease. Aortic Atherosclerosis (ICD10-I70.0). Electronically Signed   By: Marijo Conception M.D.   On: 09/18/2018 13:24   US Abdomen Limited Ruq  Result Date: 09/19/2018 CLINICAL DATA:  Cholecystitis  EXAM: ULTRASOUND ABDOMEN LIMITED RIGHT UPPER QUADRANT COMPARISON:  MRI September 18, 2018, CT September 18, 2018. FINDINGS: Gallbladder: There is a large echogenic mass seen on the peripheral outer wall of the gallbladder with posterior shadowing, likely calcified mass as seen on prior MRI and CT exam. This measures 1.4 by 1.5 x 1.2 cm. There is diffuse gallbladder wall thickening measuring 6 mm. No definite pericholecystic fluid is seen. No sonographic Percell Miller sign is seen. Common bile duct: Diameter: 4 mm. Liver: No focal lesion identified. Within normal limits in parenchymal echogenicity. Portal vein is patent on color Doppler imaging with normal direction of blood flow towards the liver. Other: None. IMPRESSION: 1. Large calcified mass seen on the peripheral outer wall of the gallbladder measuring 1.4 x 1.5 x 1.2 cm with diffuse thickened gallbladder wall. Findings suggestive of cholecystitis. 2. Normal appearing liver. 3. No extrahepatic biliary ductal dilatation. Electronically Signed   By: Ebony Cargo.D.  On: 09/19/2018 18:11    Microbiology: Recent Results (from the past 240 hour(s))  SARS Coronavirus 2 [facility] order, Performed in [facility] hospital lab) Nasopharyngeal Nasopharyngeal Swab     Status: None   Collection Time: 09/18/18  1:22 PM   Specimen: Nasopharyngeal Swab  Result Value Ref Range Status   SARS Coronavirus 2 NEGATIVE NEGATIVE Final    Comment: (NOTE) If result is NEGATIVE SARS-CoV-2 target nucleic acids are NOT DETECTED. The SARS-CoV-2 RNA is generally detectable in upper and lower  respiratory specimens during the acute phase of infection. The lowest  concentration of SARS-CoV-2 viral copies this assay can detect is 250  copies / mL. A negative result does not preclude SARS-CoV-2 infection  and should not be used as the sole basis for treatment or other  patient management decisions.  A negative result may occur with  improper specimen collection / handling, submission of  specimen other  than nasopharyngeal swab, presence of viral mutation(s) within the  areas targeted by this assay, and inadequate number of viral copies  (<250 copies / mL). A negative result must be combined with clinical  observations, patient history, and epidemiological information. If result is POSITIVE SARS-CoV-2 target nucleic acids are DETECTED. The SARS-CoV-2 RNA is generally detectable in upper and lower  respiratory specimens dur ing the acute phase of infection.  Positive  results are indicative of active infection with SARS-CoV-2.  Clinical  correlation with patient history and other diagnostic information is  necessary to determine patient infection status.  Positive results do  not rule out bacterial infection or co-infection with other viruses. If result is PRESUMPTIVE POSTIVE SARS-CoV-2 nucleic acids MAY BE PRESENT.   A presumptive positive result was obtained on the submitted specimen  and confirmed on repeat testing.  While 2019 novel coronavirus  (SARS-CoV-2) nucleic acids may be present in the submitted sample  additional confirmatory testing may be necessary for epidemiological  and / or clinical management purposes  to differentiate between  SARS-CoV-2 and other Sarbecovirus currently known to infect humans.  If clinically indicated additional testing with an alternate test  methodology [phone]) is advised. The SARS-CoV-2 RNA is generally  detectable in upper and lower respiratory sp ecimens during the acute  phase of infection. The expected result is Negative. Fact Sheet for Patients:  StrictlyIdeas.no Fact Sheet for Healthcare Providers: BankingDealers.co.za This test is not yet approved or cleared by the [place] FDA and has been authorized for detection and/or diagnosis of SARS-CoV-2 by FDA under an Emergency Use Authorization (EUA).  This EUA will remain in effect (meaning this test can be used) for the  duration of the COVID-19 declaration under Section 564(b)(1) of the Act, 21 U.S.C. section 360bbb-3(b)(1), unless the authorization is terminated or revoked sooner. Performed at [facility], [place] [address] [place],  [address]      Labs: Basic Metabolic Panel: Recent Labs  Lab 09/21/18 0322 09/22/18 0333 09/23/18 0943 09/24/18 0335 09/25/18 0314  NA 139 139 140 140 139  K 3.8 4.0 3.5 4.1 4.0  CL 107 107 111 109 108  CO2 21* 26 22 25 24   GLUCOSE 128* 126* 235* 128* 106*  BUN 17 12 12 17  24*  CREATININE 1.60* 1.60* 1.48* 1.59* 1.67*  CALCIUM 9.2 8.9 8.3* 9.1 9.1   Liver Function Tests: Recent Labs  Lab 09/21/18 0322 09/22/18 0333 09/23/18 0943 09/24/18 0335 09/25/18 0314  AST 32 26 23 20 18   ALT 117* 86* 60* 53* 42  ALKPHOS 420* 353* 289* 277* 255*  BILITOT 1.0 0.9 0.8 0.8 0.6  PROT 7.7 7.4 7.1 7.8 7.5  ALBUMIN 3.1* 3.0* 2.9* 3.2* 3.0*   No results for input(s): LIPASE, AMYLASE in the last 168 hours. No results for input(s): AMMONIA in the last 168 hours. CBC: Recent Labs  Lab 09/20/18 0354 09/20/18 1003 09/21/18 0322 09/22/18 0333 09/23/18 0943 09/24/18 0335  WBC 6.0  --  6.4 5.7 6.5 5.3  NEUTROABS 3.8  --   --   --   --   --   HGB 9.4* 10.0* 9.2* 9.0* 9.5* 9.2*  HCT 30.7* 32.7* 30.4* 29.5* 31.1* 30.7*  MCV 99.0  --  100.0 101.7* 100.6* 101.3*  PLT 323  --  301 288 287 316   Cardiac Enzymes: No results for input(s): CKTOTAL, CKMB, CKMBINDEX, TROPONINI in the last 168 hours. BNP: BNP (last 3 results) Recent Labs    07/12/18 1030  BNP 25.3    ProBNP (last 3 results) No results for input(s): PROBNP in the last 8760 hours.  CBG: Recent Labs  Lab 09/24/18 1137 09/24/18 1624 09/24/18 2147 09/25/18 0754 09/25/18 1152  GLUCAP 207* 123* 210* 143* 247*       Signed:  Florencia Reasons MD, PhD, FACP  [facility] 09/25/2018, 1:40 PM

## 2018-09-25 NOTE — Discharge Instructions (Signed)
Cholecystitis ° °Cholecystitis is irritation and swelling (inflammation) of the gallbladder. The gallbladder is an organ that is shaped like a pear. It is under the liver on the right side of the body. This organ stores bile. Bile helps the body break down (digest) the fats in food. °This condition can occur all of a sudden. It needs to be treated. °What are the causes? °This condition may be caused by stones or lumps that form in the gallbladder (gallstones). Gallstones can block the tube (duct) that carries bile out of your gallbladder. °Other causes are: °· Damage to the gallbladder due to less blood flow. °· Germs in the bile ducts. °· Scars or kinks in the bile ducts. °· Abnormal growths (tumors) in the liver, pancreas, or gallbladder. °What increases the risk? °You are more likely to develop this condition if: °· You have sickle cell disease. °· You take birth control pills.  °· You use estrogen. °· You have alcoholic liver disease. °· You have liver cirrhosis. °· You are being fed through a vein. °· You are very ill. °· You do not eat or drink for a long time. This is also called "fasting." °· You are overweight (obese). °· You lose weight too fast. °· You are pregnant. °· You have high levels of fat in the blood (triglycerides). °· You have irritation and swelling of the pancreas (pancreatitis). °What are the signs or symptoms? °Symptoms of this condition include: °· Pain in the belly (abdomen). Pain is often in the upper right area of the belly. °· Tenderness or bloating in the belly. °· Feeling sick to your stomach (nauseous). °· Throwing up (vomiting). °· Fever. °· Chills. °How is this diagnosed? °This condition may be diagnosed with a medical history and exam. You may also have other tests, such as: °· Imaging tests. This may include: °? Ultrasound. °? CT scan of the belly. °? Nuclear scan. This is also called a HIDA scan. This scan lets your doctor see the bile as it moves in your body. °? MRI. °· Blood  tests. These are done to check: °? Your blood count. The white blood cell count may be higher than normal. °? How well your liver works. °How is this treated? °This condition may be treated with: °· Surgery to take out your gallbladder. °· Antibiotic medicines to treat illnesses caused by germs. °· Going without food for some time. °· Giving fluids through an IV tube. °· Medicines to treat pain or throwing up. °Follow these instructions at home: °· If you had surgery, follow instructions from your doctor about how to care for yourself after you go home. °Medicines ° °· Take over-the-counter and prescription medicines only as told by your doctor. °· If you were prescribed an antibiotic medicine, take it as told by your doctor. Do not stop taking it even if you start to feel better. °General instructions °· Follow instructions from your doctor about what to eat or drink. Do not eat or drink anything that makes you sick again. °· Do not lift anything that is heavier than 10 lb (4.5 kg) until your doctor says that it is safe. °· Do not use any products that contain nicotine or tobacco, such as cigarettes and e-cigarettes. If you need help quitting, ask your doctor. °· Keep all follow-up visits as told by your doctor. This is important. °Contact a doctor if: °· You have pain and your medicine does not help. °· You have a fever. °Get help right away if: °·   Your pain moves to: ? Another part of your belly. ? Your back.  Your symptoms do not go away.  You have new symptoms. Summary  Cholecystitis is swelling and irritation of the gallbladder.  This condition may be caused by stones or lumps that form in the gallbladder (gallstones).  Common symptoms are pain in the belly. You may feel sick to your stomach and start throwing up. You may also have a fever and chills.  This condition may be treated with surgery to take out the gallbladder. It may also be treated with medicines, fasting, and fluids through an IV  tube.  Follow what you are told about eating and drinking. Do not eat things that make you sick again. This information is not intended to replace advice given to you by your health care provider. Make sure you discuss any questions you have with your health care provider. Document Released: 01/11/2011 Document Revised: 05/31/2017 Document Reviewed: 05/31/2017 Elsevier Patient Education  2020 [employer].

## 2018-09-25 NOTE — Care Management Important Message (Signed)
Important Message  Patient Details IM Letter given to Sharren Bridge SW to present to the Patient Name: [name] MRN: [id number] Date of Birth: [birth year]   Medicare Important Message Given:  Yes     Kerin Salen 09/25/2018, 10:00 AM

## 2018-09-25 NOTE — Telephone Encounter (Signed)
R/s appt per 8/18 sch message - pt is aware of appt date and time

## 2018-09-25 NOTE — Progress Notes (Signed)
Patient ID: [name], female   DOB: [birth year], 68 y.o.   MRN: [id number]       Subjective: Doing well.  No abdominal complaints.  Tolerating a solid diet.  Objective: Vital signs in last 24 hours: Temp:  [97.8 F (36.6 C)-99.2 F (37.3 C)] 97.8 F (36.6 C) (08/20 0517) Pulse Rate:  [61-71] 61 (08/20 0517) Resp:  [16-18] 16 (08/20 0517) BP: (134-153)/(63-80) 134/70 (08/20 0517) SpO2:  [96 %-98 %] 98 % (08/20 0727) Weight:  [58.6 kg] 58.6 kg (08/20 0500) Last BM Date: 09/25/18  Intake/Output from previous day: 08/19 0701 - 08/20 0700 In: 480 [P.O.:480] Out: -  Intake/Output this shift: Total I/O In: 236 [P.O.:236] Out: 850 [Urine:500; Stool:350]  PE: Abd: soft, NT, ND, +BS  Lab Results:  Recent Labs    09/23/18 0943 09/24/18 0335  WBC 6.5 5.3  HGB 9.5* 9.2*  HCT 31.1* 30.7*  PLT 287 316   BMET Recent Labs    09/24/18 0335 09/25/18 0314  NA 140 139  K 4.1 4.0  CL 109 108  CO2 25 24  GLUCOSE 128* 106*  BUN 17 24*  CREATININE 1.59* 1.67*  CALCIUM 9.1 9.1   PT/INR No results for input(s): LABPROT, INR in the last 72 hours. CMP     Component Value Date/Time   NA 139 09/25/2018 0314   K 4.0 09/25/2018 0314   CL 108 09/25/2018 0314   CO2 24 09/25/2018 0314   GLUCOSE 106 (H) 09/25/2018 0314   BUN 24 (H) 09/25/2018 0314   CREATININE 1.67 (H) 09/25/2018 0314   CREATININE 2.27 (H) 08/14/2018 1250   CALCIUM 9.1 09/25/2018 0314   PROT 7.5 09/25/2018 0314   ALBUMIN 3.0 (L) 09/25/2018 0314   AST 18 09/25/2018 0314   AST 15 08/14/2018 1250   ALT 42 09/25/2018 0314   ALT 16 08/14/2018 1250   ALKPHOS 255 (H) 09/25/2018 0314   BILITOT 0.6 09/25/2018 0314   BILITOT 0.4 08/14/2018 1250   GFRNONAA 31 (L) 09/25/2018 0314   GFRNONAA 21 (L) 08/14/2018 1250   GFRAA 36 (L) 09/25/2018 0314   GFRAA 25 (L) 08/14/2018 1250   Lipase     Component Value Date/Time   LIPASE 44 09/18/2018 1216       Studies/Results: No results  found.  Anti-infectives: Anti-infectives (From admission, onward)   Start     Dose/Rate Route Frequency Ordered Stop   09/19/18 1600  cefTRIAXone (ROCEPHIN) 2 g in sodium chloride 0.9 % 100 mL IVPB     2 g 200 mL/hr over 30 Minutes Intravenous Every 24 hours 09/18/18 1625     09/18/18 1600  metroNIDAZOLE (FLAGYL) IVPB 500 mg     500 mg 100 mL/hr over 60 Minutes Intravenous Every 8 hours 09/18/18 1516     09/18/18 1515  cefTRIAXone (ROCEPHIN) 2 g in sodium chloride 0.9 % 100 mL IVPB     2 g 200 mL/hr over 30 Minutes Intravenous  Once 09/18/18 1505 09/18/18 1650       Assessment/Plan 1.  Cholecystitis/cholelithiasis             On Rocephin/flagyl                  Cannot perc drain.  Poor surgical candidate at this time.  She is a high risk candidate for a lap chole currently with risk of bile duct or bowel injury.  We can see her in 6 to 8 weeks to consider interval cholecystectomy. -tolerating a  solid diet. -surgically stable for DC home              2.  History of right breast cancer - Her2Neu positive             Right mastectomy - 11/27/2017 - [facility]             On herceptin by Dr. Lindi Adie 3.  CAD 4.  CVA 5.  DM 6.  CKD - Creatinine - 1.67 7.  Anemia              Hgb - 9.2 - 09/24/2018 8. DVT prophylaxis - on no chemoprophylaxis   LOS: 7 days    Henreitta Cea , [facility] 09/25/2018, 9:38 AM Pager: [phone]

## 2018-09-26 NOTE — Assessment & Plan Note (Deleted)
11/27/2017 right mastectomy: IDC grade 2, 1.6 cm, separate foci of DCIS intermediate grade, margins negative, 0/5 lymph nodes negative,ER 100%, PR 50%, Ki-67 15%, HER-2 positive ratio 2, copy #5 T1c N0 stage Ia  Treatment plan: 1. Adjuvant Herceptin every 3 weeks for 1 year (because of poor performance status Taxol is felt to be intolerable for her) 2. Adjuvant antiestrogen therapy with letrozole 2.5 mg daily  Patient's daughter is the primary point of contact for her and all appointment changes will need to go through her. -------------------------------------------------------------------------------------- Current treatment:Herceptin with anastrozole started 02/07/2018 Echo 02/17/2018 EF 55-60%--followed by [facility]  Tolerating anastrozole and Herceptin well. Will continue this. Hospitalization 07/14/2018: Chest pain  Severe anemia: GivenPRBCspreviously.Today's hemoglobin is11.4. Hospitalization: 09/18/2018-09/25/2018: Acute cholecystitis with elevated liver enzymes: Treated conservatively. MRCP: Acute cholecystitis and 2.8 cm cystic lesion along the wall of the gallbladder favored localized perforation.  Return to clinic in 3 weeks for Herceptin every 6 weeks for follow-up with me

## 2018-09-30 ENCOUNTER — Ambulatory Visit (HOSPITAL_BASED_OUTPATIENT_CLINIC_OR_DEPARTMENT_OTHER)
Admission: RE | Admit: 2018-09-30 | Discharge: 2018-09-30 | Disposition: A | Discharge status: Home / Self Care | Payer: Medicare Other | Source: Ambulatory Visit | Attending: Internal Medicine | Admitting: Internal Medicine

## 2018-09-30 ENCOUNTER — Other Ambulatory Visit: Payer: Self-pay

## 2018-09-30 ENCOUNTER — Encounter (HOSPITAL_COMMUNITY): Payer: Self-pay | Admitting: Internal Medicine

## 2018-09-30 VITALS — BP 170/80 | HR 60 | Wt 128.2 lb

## 2018-09-30 DIAGNOSIS — R109 Unspecified abdominal pain: Secondary | ICD-10-CM | POA: Insufficient documentation

## 2018-09-30 DIAGNOSIS — K810 Acute cholecystitis: Secondary | ICD-10-CM | POA: Insufficient documentation

## 2018-09-30 DIAGNOSIS — C50411 Malignant neoplasm of upper-outer quadrant of right female breast: Secondary | ICD-10-CM

## 2018-09-30 DIAGNOSIS — Z170 Estrogen receptor positive status [ER+]: Secondary | ICD-10-CM | POA: Insufficient documentation

## 2018-09-30 DIAGNOSIS — E119 Type 2 diabetes mellitus without complications: Secondary | ICD-10-CM | POA: Insufficient documentation

## 2018-09-30 DIAGNOSIS — Z794 Long term (current) use of insulin: Secondary | ICD-10-CM | POA: Insufficient documentation

## 2018-09-30 DIAGNOSIS — R1011 Right upper quadrant pain: Secondary | ICD-10-CM

## 2018-09-30 DIAGNOSIS — Z853 Personal history of malignant neoplasm of breast: Secondary | ICD-10-CM | POA: Insufficient documentation

## 2018-09-30 DIAGNOSIS — Z87891 Personal history of nicotine dependence: Secondary | ICD-10-CM | POA: Insufficient documentation

## 2018-09-30 DIAGNOSIS — K8067 Calculus of gallbladder and bile duct with acute and chronic cholecystitis with obstruction: Secondary | ICD-10-CM | POA: Diagnosis not present

## 2018-09-30 DIAGNOSIS — K760 Fatty (change of) liver, not elsewhere classified: Secondary | ICD-10-CM | POA: Insufficient documentation

## 2018-09-30 DIAGNOSIS — K219 Gastro-esophageal reflux disease without esophagitis: Secondary | ICD-10-CM | POA: Insufficient documentation

## 2018-09-30 DIAGNOSIS — I2510 Atherosclerotic heart disease of native coronary artery without angina pectoris: Secondary | ICD-10-CM

## 2018-09-30 DIAGNOSIS — I252 Old myocardial infarction: Secondary | ICD-10-CM | POA: Insufficient documentation

## 2018-09-30 DIAGNOSIS — K8050 Calculus of bile duct without cholangitis or cholecystitis without obstruction: Secondary | ICD-10-CM | POA: Diagnosis not present

## 2018-09-30 DIAGNOSIS — E785 Hyperlipidemia, unspecified: Secondary | ICD-10-CM | POA: Insufficient documentation

## 2018-09-30 DIAGNOSIS — J45909 Unspecified asthma, uncomplicated: Secondary | ICD-10-CM | POA: Insufficient documentation

## 2018-09-30 DIAGNOSIS — I110 Hypertensive heart disease with heart failure: Secondary | ICD-10-CM | POA: Insufficient documentation

## 2018-09-30 DIAGNOSIS — Z7982 Long term (current) use of aspirin: Secondary | ICD-10-CM | POA: Insufficient documentation

## 2018-09-30 DIAGNOSIS — M1990 Unspecified osteoarthritis, unspecified site: Secondary | ICD-10-CM | POA: Insufficient documentation

## 2018-09-30 DIAGNOSIS — Z955 Presence of coronary angioplasty implant and graft: Secondary | ICD-10-CM | POA: Insufficient documentation

## 2018-09-30 DIAGNOSIS — R0789 Other chest pain: Secondary | ICD-10-CM | POA: Insufficient documentation

## 2018-09-30 DIAGNOSIS — Z79899 Other long term (current) drug therapy: Secondary | ICD-10-CM | POA: Insufficient documentation

## 2018-09-30 NOTE — Patient Instructions (Signed)
Follow up and echo in 3 months.  

## 2018-09-30 NOTE — Progress Notes (Signed)
  Echocardiogram 2D Echocardiogram has been performed.  [name] 09/30/2018, 11:47 AM

## 2018-09-30 NOTE — Progress Notes (Signed)
Cardio-Oncology Clinic Note   Date:  09/30/2018   ID:  [name], [name] 1950-03-04, MRN [id number]  Location: Home  Provider location: [facility] Type of Visit: Established patient  PCP:  Tsosie Billing, MD (Inactive)  Cardiologist:  No primary care provider on file. Primary HF: [name]  Chief Complaint: Cardio-oncology follow-up   History of Present Illness:  [name] is a 68 y.o. female with past medical history of R breast cancer, CAD s/p RCA and OM stent followed by Dr. Dominica Severin who has been referred by Dr. Lindi Adie to establish in the cardio-oncology clinic for monitoring of cardio-toxicity while undergoing chemotherapy for her R breast CA..      Malignant neoplasm of upper-outer quadrant of right breast in female, estrogen receptor positive ([place])   09/03/2017 Initial Diagnosis    Screening mammogram detected 1.4 cm right breast mass at 10 o'clock position, additional pleomorphic calcifications 2 cm posteriorly, additional loosely grouped calcifications 10.1 x 7.1 x 5.3 cm, single right axillary lymph node: Biopsy revealed DCIS ER 100%, PR 50%; IDC grade 2-3 ER 100%, PR 50%, Ki-67 15%, HER-2 positive ratio 2, copy #5, lymph node negative T2 N0 stage Ia clinical stage AJCC 8    11/27/2017 Surgery    Right mastectomy: IDC grade 2, 1.6 cm, separate foci of DCIS intermediate grade, margins negative, 0/5 lymph nodes negative,ER 100%, PR 50%, Ki-67 15%, HER-2 positive ratio 2, copy #5 T1c N0 stage Ia   Treatment Plan  Herceptin q 3 weeks x 1 year. To finish 01/2019 if stays on schedule.    She presents for routine f/u. Continues to get herceptin without difficulty. Denies CP or SOB. Admitted to [facility] last week with ab pain and found to have probable acute cholecystitis. [facility] has seen and plans cholecystectomy in 6-8 weeks. Says RUQ and epigastric pain recurred last night after eating. She is quite uncomfortable today.  Denies n/v.   Echo today 09/30/18  EF 55-60% moderate AI Personally reviewed   Echo 07/10/18 EF 50-55% GLS inaccurate  Echo 1/20  LVEF 55-60%, mild AI, Grade 1 DD, -16.4  Echo 11/26/17 LVEF 50-55%, Grade 1 DD, Mild/mod AI, GLS - 14.55  Stress test 04/20/2016 (Most recent) 1. No reversible ischemia or infarction. 2. Normal left ventricular wall motion. 3. Left ventricular ejection fraction 54% 4. Non invasive risk stratification*: Low  LHC 02/20/2011 - LV showed mild global hypokinesia, EF of approximately 50%. - Left main was patent. LAD has 10-15% proximal stenosis.  -Diagonal 1 has 20% ostial stenosis.Diagonal 2 is very small.  - Left circumflex has 40-50% proximal sequential stenosis. Stented midportion is patent.  - OM 1 and OM 2 were very, very small. OM 3 and 4 were small which were patent.  - RCA was patent at prior PTCA stenting site. Aortography showed no aortic aneurysm.  - Right renal artery has 60-70% ostial stenosis. Left renal artery is patent.     [name] denies symptoms worrisome for COVID 19.   Past Medical History:  Diagnosis Date   Angina    Arthritis    HANDS"   Asthma    Cancer ([place]) 09/04/2017   Right Breast   Carpal tunnel syndrome, bilateral 11/26/2016   Cholelithiasis    Cocaine abuse ([place]) 07/2012   per E.R. drug screen   Coronary artery disease    Diabetes mellitus without mention of complication    type 2   Dysphagia  esophageal dysmotility on 03/2011 esophagram    Dyspnea    ongoing    Fatty liver disease, nonalcoholic 7564   on Imaging.    GERD (gastroesophageal reflux disease)    Headache    History of colonic polyps 2009   adenomatous 2009, HP in 2009, 2011, 2013.    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   Past Surgical History:  Procedure Laterality Date    ABDOMINAL ANGIOGRAM  02/20/2011   Procedure: ABDOMINAL ANGIOGRAM;  Surgeon: Clent Demark, MD;  Location: [facility] CATH LAB;  Service: Cardiovascular;;   CARPAL TUNNEL RELEASE Right 07/04/2017   Procedure: RIGHT CARPAL TUNNEL RELEASE;  Surgeon: Leanora Cover, MD;  Location: [place];  Service: Orthopedics;  Laterality: Right;   CARPAL TUNNEL RELEASE Left 09/12/2017   Procedure: LEFT CARPAL TUNNEL RELEASE;  Surgeon: Leanora Cover, MD;  Location: [place];  Service: Orthopedics;  Laterality: Left;   COLONOSCOPY  11/30/2011   Procedure: COLONOSCOPY;  Surgeon: Lafayette Dragon, MD;  Location: [facility] ENDOSCOPY;  Service: Endoscopy;  Laterality: N/A;   CORONARY ANGIOPLASTY WITH STENT PLACEMENT     ESOPHAGOGASTRODUODENOSCOPY  11/30/2011   Procedure: ESOPHAGOGASTRODUODENOSCOPY (EGD);  Surgeon: Lafayette Dragon, MD;  Location: [facility];  Service: Endoscopy;  Laterality: N/A;   ESOPHAGOGASTRODUODENOSCOPY N/A 02/23/2015   Procedure: ESOPHAGOGASTRODUODENOSCOPY (EGD);  Surgeon: Gatha Mayer, MD;  Location: [facility] ENDOSCOPY;  Service: Endoscopy;  Laterality: N/A;   LEFT HEART CATHETERIZATION WITH CORONARY ANGIOGRAM N/A 02/20/2011   Procedure: LEFT HEART CATHETERIZATION WITH CORONARY ANGIOGRAM;  Surgeon: Clent Demark, MD;  Location: [facility] CATH LAB;  Service: Cardiovascular;  Laterality: N/A;   MASTECTOMY Right 11/05/2017   MASTECTOMY W/ SENTINEL NODE BIOPSY Right 11/27/2017   MASTECTOMY W/ SENTINEL NODE BIOPSY Right 11/27/2017   Procedure: RIGHT TOTAL MASTECTOMY WITH SENTINEL LYMPH NODE BIOPSY;  Surgeon: Excell Seltzer, MD;  Location: [facility];  Service: General;  Laterality: Right;   PORT A CATH REVISION Left 05/07/2018   Procedure: PORT A CATH REVISION;  Surgeon: Excell Seltzer, MD;  Location: [facility];  Service: General;  Laterality: Left;   PORTACATH PLACEMENT Left 11/27/2017   Procedure: INSERTION PORT-A-CATH;  Surgeon: Excell Seltzer, MD;  Location: [facility];  Service:  General;  Laterality: Left;   RIGHT COLECTOMY  02/2007   for post polypectomy colonic perforation   TUBAL LIGATION       Current Outpatient Medications  Medication Sig Dispense Refill   acetaminophen (TYLENOL) 325 MG tablet Take 2 tablets (650 mg total) by mouth every 4 (four) hours as needed for headache or mild pain. 60 tablet 1   albuterol (PROVENTIL) (2.5 MG/3ML) 0.083% nebulizer solution INHALE THE CONTENTS OF 1 VIAL USING NEBULIZER EVERY 6 HOURS AS NEEDEDFOR WHEEZING OR SHORTNESS OF BREATH (Patient taking differently: Take 2.5 mg by nebulization every 6 (six) hours as needed for wheezing or shortness of breath. ) 75 mL 2   allopurinol (ZYLOPRIM) 100 MG tablet Take 100 mg by mouth daily.     ALPRAZolam (XANAX) 0.25 MG tablet Take 0.25 mg by mouth daily.      amLODipine (NORVASC) 5 MG tablet Take 5 mg by mouth daily.     anastrozole (ARIMIDEX) 1 MG tablet Take 1 tablet (1 mg total) by mouth daily. 90 tablet 3   aspirin EC 81 MG EC tablet Take 1  tablet (81 mg total) by mouth daily. 30 tablet 0   atorvastatin (LIPITOR) 20 MG tablet Take 20 mg by mouth daily.     budesonide-formoterol (SYMBICORT) 160-4.5 MCG/ACT inhaler Inhale 1 puff into the lungs 2 (two) times daily.      colchicine 0.6 MG tablet Take 0.6 mg by mouth daily as needed for pain.     feeding supplement, ENSURE ENLIVE, (ENSURE ENLIVE) LIQD Take 237 mLs by mouth 2 (two) times daily between meals. 60 Bottle 0   LANTUS SOLOSTAR 100 UNIT/ML Solostar Pen Inject 10 Units into the skin daily.     loperamide (IMODIUM) 2 MG capsule Take 1 capsule (2 mg total) by mouth 4 (four) times daily as needed for diarrhea or loose stools. 12 capsule 0   metoprolol succinate (TOPROL-XL) 50 MG 24 hr tablet Take 50 mg by mouth at bedtime. Take with or immediately following a meal.     nitroGLYCERIN (NITROSTAT) 0.4 MG SL tablet Place 1 tablet (0.4 mg total) under the tongue every 5 (five) minutes as needed. For chest pain (Patient  taking differently: Place 0.4 mg under the tongue every 5 (five) minutes as needed for chest pain. ) 10 tablet 0   Omega-3 Fatty Acids (FISH OIL) 1000 MG CAPS Take 1,000 mg by mouth daily.     ondansetron (ZOFRAN) 8 MG tablet Take 1 tablet (8 mg total) by mouth every 8 (eight) hours as needed for nausea or vomiting. 20 tablet 0   saccharomyces boulardii (FLORASTOR) 250 MG capsule Take 1 capsule (250 mg total) by mouth 2 (two) times daily for 15 days. 30 capsule 0   traMADol (ULTRAM) 50 MG tablet Take 50-100 mg by mouth every 6 (six) hours as needed for pain.     No current facility-administered medications for this encounter.     Allergies:   Patient has no known allergies.   Social History:  The patient  reports that she quit smoking about 8 years ago. Her smoking use included cigarettes. She has a 23.50 pack-year smoking history. She has never used smokeless tobacco. She reports previous drug use. Drug: Cocaine. She reports that she does not drink alcohol.   Family History:  The patient's family history includes Cancer in her father; Diabetes in her mother and sister; Heart disease in her mother.   ROS:  Please see the history of present illness.   All other systems are personally reviewed and negative.   Exam:   General:  Elderly. uncomfortable No resp difficulty HEENT: normal Neck: supple. no JVD. Carotids 2+ bilat; no bruits. No lymphadenopathy or thryomegaly appreciated. Cor: PMI nondisplaced. Regular rate & rhythm. No rubs, gallops or murmurs. 2/6 AI Lungs: clear Abdomen: soft, tender ir RUQ and epigastrum, No rebound + distended. No hepatosplenomegaly. No bruits or masses. Good bowel sounds. Extremities: no cyanosis, clubbing, rash, edema Neuro: alert & orientedx3, cranial nerves grossly intact. moves all 4 extremities w/o difficulty. Affect pleasant  Recent Labs: 07/12/2018: B Natriuretic Peptide 25.3 09/24/2018: Hemoglobin 9.2; Platelets 316 09/25/2018: ALT 42; BUN 24;  Creatinine, Ser 1.67; Potassium 4.0; Sodium 139  Personally reviewed   Wt Readings from Last 3 Encounters:  09/30/18 58.2 kg (128 lb 3.2 oz)  09/25/18 58.6 kg (129 lb 3 oz)  09/04/18 58.5 kg (129 lb)      ASSESSMENT AND PLAN:  1. Malignant neoplasm of upper-outer quadrant of right breast in female, estrogen receptor positive ([place]) - She is on Herceptin. Did not tolerate Taxol due to malaise and  paraesthesias per patient report.  - Echo 07/10/18 EF 50-55% GLS inaccurate - Echo 1/20  LVEF 55-60%, mild AI, Grade 1 DD, -16.4 - Echo 11/26/17 LVEF 50-55%, Grade 1 DD, Mild/mod AI, GLS - 14.55 - I reviewed echos personally. EF and Doppler parameters stable. No HF on exam. Continue Herceptin.   2. CAD - She has chronic atypical chest pain. Currently quiescent.  - LHC 02/2011 with patent stents - Most recent ischemic work up 04/2016 Myoview with no reversible ischemia or infarction.  - Continue ASA. Statin, b-blocker - follows with Dr. Terrence Dupont   3. Abdominal pain - Recent admit for acute cholecystitis - has recurrent pain and tenderness today.  - I suggested to her and her grandson that we transfer her to ER for further evaluation but she refused despite multiple discussions  4. Moderate AI - stable. Followed by Dr, Terrence Dupont   Signed, Glori Bickers, MD  09/30/2018 12:18 PM  [facility] [place] [address] [facility] and [place] [address] [phone] (office) [phone] (fax)

## 2018-09-30 NOTE — Addendum Note (Signed)
Encounter addended by: Harvie Junior, CMA on: 09/30/2018 3:47 PM  Actions taken: Order list changed, Diagnosis association updated

## 2018-10-01 ENCOUNTER — Ambulatory Visit: Payer: Medicare Other | Admitting: Physical Therapy

## 2018-10-01 DIAGNOSIS — M6281 Muscle weakness (generalized): Secondary | ICD-10-CM | POA: Insufficient documentation

## 2018-10-01 DIAGNOSIS — G8929 Other chronic pain: Secondary | ICD-10-CM | POA: Insufficient documentation

## 2018-10-01 DIAGNOSIS — Z483 Aftercare following surgery for neoplasm: Secondary | ICD-10-CM | POA: Insufficient documentation

## 2018-10-01 DIAGNOSIS — M25611 Stiffness of right shoulder, not elsewhere classified: Secondary | ICD-10-CM | POA: Insufficient documentation

## 2018-10-01 DIAGNOSIS — M25511 Pain in right shoulder: Secondary | ICD-10-CM | POA: Insufficient documentation

## 2018-10-01 NOTE — Therapy (Signed)
[place], [place], [address] Phone: [phone]   Fax:  [phone]  Physical Therapy Evaluation  Patient Details  Name: [name] MRN: [id number] Date of Birth: [birth year] Referring Provider (PT): Dr. Lindi Adie    Encounter Date: 10/01/2018  PT End of Session - 10/01/18 1315    Visit Number  1    Number of Visits  9    Date for PT Re-Evaluation  12/05/18   extended authorization period as pt will be having cholecystectomy   PT Start Time  1230    PT Stop Time  1315    PT Time Calculation (min)  45 min    Activity Tolerance  Patient limited by pain    Behavior During Therapy  [facility] for tasks assessed/performed       Past Medical History:  Diagnosis Date  . Angina   . Arthritis    HANDS"  . Asthma   . Cancer ([place]) 09/04/2017   Right Breast  . Carpal tunnel syndrome, bilateral 11/26/2016  . Cholelithiasis   . Cocaine abuse ([place]) 07/2012   per E.R. drug screen  . Coronary artery disease   . Diabetes mellitus without mention of complication    type 2  . Dysphagia    esophageal dysmotility on 03/2011 esophagram   . Dyspnea    ongoing   . Fatty liver disease, nonalcoholic 9798   on Imaging.   Marland Kitchen GERD (gastroesophageal reflux disease)   . Headache   . History of colonic polyps 2009   adenomatous 2009, HP in 2009, 2011, 2013.   Marland Kitchen Hyperlipidemia   . Hypertension   . Iron deficiency anemia   . Myocardial infarction ([place])    years ago, 6 stents placed  . 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 ABDOMINAL ANGIOGRAM  02/20/2011   Procedure: ABDOMINAL ANGIOGRAM;  Surgeon: Clent Demark, MD;  Location: [facility] CATH LAB;  Service: Cardiovascular;;  . CARPAL TUNNEL RELEASE Right 07/04/2017   Procedure: RIGHT CARPAL TUNNEL RELEASE;  Surgeon: Leanora Cover, MD;  Location: [place];  Service: Orthopedics;  Laterality:  Right;  . CARPAL TUNNEL RELEASE Left 09/12/2017   Procedure: LEFT CARPAL TUNNEL RELEASE;  Surgeon: Leanora Cover, MD;  Location: [facility];  Service: Orthopedics;  Laterality: Left;  . COLONOSCOPY  11/30/2011   Procedure: COLONOSCOPY;  Surgeon: Lafayette Dragon, MD;  Location: [facility];  Service: Endoscopy;  Laterality: N/A;  . CORONARY ANGIOPLASTY WITH STENT PLACEMENT    . ESOPHAGOGASTRODUODENOSCOPY  11/30/2011   Procedure: ESOPHAGOGASTRODUODENOSCOPY (EGD);  Surgeon: Lafayette Dragon, MD;  Location: [facility];  Service: Endoscopy;  Laterality: N/A;  . ESOPHAGOGASTRODUODENOSCOPY N/A 02/23/2015   Procedure: ESOPHAGOGASTRODUODENOSCOPY (EGD);  Surgeon: Gatha Mayer, MD;  Location: [facility]COPY;  Service: Endoscopy;  Laterality: N/A;  . LEFT HEART CATHETERIZATION WITH CORONARY ANGIOGRAM N/A 02/20/2011   Procedure: LEFT HEART CATHETERIZATION WITH CORONARY ANGIOGRAM;  Surgeon: Clent Demark, MD;  Location: [facility] CATH LAB;  Service: Cardiovascular;  Laterality: N/A;  . MASTECTOMY Right 11/05/2017  . MASTECTOMY W/ SENTINEL NODE BIOPSY Right 11/27/2017  . MASTECTOMY W/ SENTINEL NODE BIOPSY Right 11/27/2017   Procedure: RIGHT TOTAL MASTECTOMY WITH SENTINEL LYMPH NODE BIOPSY;  Surgeon: Excell Seltzer, MD;  Location: [facility];  Service: General;  Laterality: Right;  . PORT A CATH REVISION Left 05/07/2018   Procedure: PORT A CATH REVISION;  Surgeon: Excell Seltzer, MD;  Location: [facility];  Service: General;  Laterality: Left;  . PORTACATH PLACEMENT Left 11/27/2017   Procedure: INSERTION PORT-A-CATH;  Surgeon: Excell Seltzer, MD;  Location: [facility];  Service: General;  Laterality: Left;  . RIGHT COLECTOMY  02/2007   for post polypectomy colonic perforation  . TUBAL LIGATION      There were no vitals filed for this visit.   Subjective Assessment - 10/01/18 1223    Subjective  "It keeps on swelling and it stays itchy all the time"  Pt has some difficulty with speaking and has a hard time  hearing me due to mask    Pertinent History  right breast cancer with right mastectomy 11/27/2017 wit 0/5 lypmph nodes positive.  Pt receiveing herceptin.  Recent hospitalization for cholecystitis with pt to have a cholecystectomy in 6 -8 weeks.    Patient Stated Goals  to get rid of the swelling.    Currently in Pain?  Yes    Pain Score  4     Pain Orientation  Right    Pain Descriptors / Indicators  Sore    Pain Type  Chronic pain         [facility] PT Assessment - 10/01/18 0001      Assessment   Medical Diagnosis  right breast cancer     Referring Provider (PT)  Dr. Lindi Adie     Onset Date/Surgical Date  11/27/17    Hand Dominance  Right      Precautions   Precautions  Fall;Other (comment)    Precaution Comments  at risk for lymphedema       Restrictions   Weight Bearing Restrictions  No      Balance Screen   Has the patient fallen in the past 6 months  No    Has the patient had a decrease in activity level because of a fear of falling?   No   pt uses a rollator and has a cane tool   Is the patient reluctant to leave their home because of a fear of falling?   No      Home Environment   Living Environment  Private residence    Living Arrangements  Children;Non-relatives/Friends   lives with grandson and son    Available Help at Discharge  Available PRN/intermittently    Additional Comments  children help her in and out of house       Prior Function   Level of Independence  Independent   if she takes her time      Cognition   Overall Cognitive Status  Within Functional Limits for tasks assessed      Observation/Other Assessments   Observations  well healed mastectomy incision on right chest with contraction of scar at lateral end with fullness around scar and lateral chest that pt indicates is where she has the fullness and soreness.       Posture/Postural Control   Posture/Postural Control  Postural limitations    Postural Limitations  Rounded Shoulders;Forward  head;Increased thoracic kyphosis      AROM   Right Shoulder Extension  61 Degrees    Right Shoulder Flexion  128 Degrees    Right Shoulder ABduction  90 Degrees    Right Shoulder External Rotation  85 Degrees    Left Shoulder Extension  60 Degrees    Left Shoulder Flexion  170 Degrees    Left Shoulder ABduction  150 Degrees    Left Shoulder External Rotation  90 Degrees  Strength   Overall Strength Comments  generalized decreased muscle strength but right shoulder is 3-/5 in shoulder abduction and flexion       Palpation   Palpation comment  several guitar string cords felt in right axilla  decreased scar mobility on right chest and tight tender trigger points around scapula         LYMPHEDEMA/ONCOLOGY QUESTIONNAIRE - 10/01/18 1247      Type   Cancer Type  right breast       Surgeries   Mastectomy Date  11/27/17    Number Lymph Nodes Removed  5      Treatment   Current Hormone Treatment  Yes    Drug Name  herceptin       What other symptoms do you have   Are you Having Heaviness or Tightness  Yes    Are you having Pain  Yes    Are you having pitting edema  No    Is it Hard or Difficult finding clothes that fit  Yes    Do you have infections  No    [place]emmer Sign  No      Lymphedema Assessments   Lymphedema Assessments  Upper extremities      Right Upper Extremity Lymphedema   10 cm Proximal to Olecranon Process  29 cm    Olecranon Process  24 cm    10 cm Proximal to Ulnar Styloid Process  21 cm    Just Proximal to Ulnar Styloid Process  14.8 cm    Across Hand at [employer]  17.8 cm    At [place] of 2nd Digit  6 cm      Left Upper Extremity Lymphedema   10 cm Proximal to Olecranon Process  28 cm    Olecranon Process  23 cm    10 cm Proximal to Ulnar Styloid Process  21 cm    Just Proximal to Ulnar Styloid Process  14.9 cm    Across Hand at [employer]  18 cm    At [place] of 2nd Digit  6 cm          Quick Dash - 10/01/18 0001    Open a tight or  new jar  Severe difficulty    Do heavy household chores (wash walls, wash floors)  Severe difficulty    Carry a shopping bag or briefcase  Severe difficulty    Wash your back  Unable    Use a knife to cut food  Severe difficulty    Recreational activities in which you take some force or impact through your arm, shoulder, or hand (golf, hammering, tennis)  Severe difficulty    During the past week, to what extent has your arm, shoulder or hand problem interfered with your normal social activities with family, friends, neighbors, or groups?  Slightly    During the past week, to what extent has your arm, shoulder or hand problem limited your work or other regular daily activities  Slightly    Arm, shoulder, or hand pain.  Severe    Tingling (pins and needles) in your arm, shoulder, or hand  Mild    Difficulty Sleeping  Severe difficulty    DASH Score  63.64 %        Objective measurements completed on examination: See above findings.                   PT Long Term Goals - 10/01/18 1259  PT LONG TERM GOAL #1   Title  Pt will have 130 degrees of right shoulder abduction so that can do her activities of daily living better    Baseline  90    Time  4    Period  Weeks    Status  New      PT LONG TERM GOAL #2   Title  Pt will have a written home program for home exercises so that she can do exercises at home    Time  4    Period  Weeks    Status  New      PT LONG TERM GOAL #3   Title  Pt will report that pain and discomfort in her right lateral trunk is decreased to 2/10    Time  4    Period  Weeks             Plan - 10/01/18 1317    Clinical Impression Statement  Pt has limited right shoulder mobility and strenght with evidence of axillary cording after mastectomy 11/2017.  Attempted to do beginning range of motion exercise today, but pt had so much pain epigastric pain she had to stop ( pt is scheduled for a cholecystectomy in a few weeks)  Do not feel she  will be able to tolerate PT until this is taken care of.  Explained same to grandson and asked him to call her GI doctor ( pt refused to go to hospital) Suanne Marker sent to Dr. Lindi Adie also . Pt will call back for appointments when she feels better and that she could partipate in PT    Personal Factors and Comorbidities  Age;Comorbidity 2    Comorbidities  acute cholecystits, previous mastectomy, ongoing herceptin    Examination-Activity Limitations  Dressing;Locomotion Level    Stability/Clinical Decision Making  Evolving/Moderate complexity    Clinical Decision Making  Moderate    Rehab Potential  Good    PT Frequency  2x / week    PT Duration  8 weeks   do not feel pt will be able to start PT for a while   PT Treatment/Interventions  ADLs/Self Care Home Management;Therapeutic exercise;Therapeutic activities;Patient/family education;Orthotic Fit/Training;Manual lymph drainage;Scar mobilization;Passive range of motion;Taping;Manual techniques    PT Next Visit Plan  Active and passive ROM, instruct in home program, teach HEP manual techniques as indicated    Consulted and Agree with Plan of Care  Patient       Patient will benefit from skilled therapeutic intervention in order to improve the following deficits and impairments:  Decreased range of motion, Increased muscle spasms, Impaired UE functional use, Pain, Decreased scar mobility, Increased edema, Postural dysfunction, Increased fascial restricitons  Visit Diagnosis: Aftercare following surgery for neoplasm - Plan: PT plan of care cert/re-cert  Stiffness of right shoulder joint - Plan: PT plan of care cert/re-cert  Chronic right shoulder pain - Plan: PT plan of care cert/re-cert  Muscle weakness (generalized) - Plan: PT plan of care cert/re-cert     Problem List Patient Active Problem List   Diagnosis Date Noted  . CKD (chronic kidney disease), stage III ([place])   . Acute cholecystitis   . Elevated liver enzymes 09/18/2018  .  Calculus of gallbladder and bile duct with acute cholecystitis, with obstruction   . Melena   . Unstable angina ([place]) 07/12/2018  . Port-A-Cath in place 02/27/2018  . Cancer of overlapping sites of right breast ([place]) 11/27/2017  . Malignant neoplasm of upper-outer quadrant of right  breast in female, estrogen receptor positive ([place]) 10/03/2017  . Carpal tunnel syndrome, bilateral 11/26/2016  . 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 Abdominal pain, epigastric   . Acute on chronic renal failure ([place]) 02/19/2015  . Diarrhea   . Nausea with vomiting   . Hyperglycemia 04/01/2014  . Hyperosmolar non-ketotic state in patient with type 2 diabetes mellitus ([place]) 04/01/2014  . Nausea vomiting and diarrhea 04/01/2014  . CAD (coronary artery disease) 04/01/2014  . Dehydration   . Diabetic ketoacidosis without coma associated with diabetes mellitus due to underlying condition ([place])   . High anion gap metabolic acidosis   . Cocaine abuse ([place])   . Toxic encephalopathy-Unlikely metabolic,?psychogenic 08/02/2012  . Bereavement 02/02/2012  . Depression 02/02/2012  . Iron deficiency anemia, unspecified 11/30/2011  . Nonspecific abnormal finding in stool contents 11/30/2011  . Benign neoplasm of colon 11/30/2011  . Cough 05/14/2011  . Chest pain 03/19/2011  . Macrocytic anemia 03/19/2011  . Insulin dependent diabetes mellitus ([employer]) 03/19/2011  . DOE (dyspnea on exertion) 03/19/2011  . Chest pain at rest 02/18/2011    Class: Acute  . Cholelithiasis 11/01/2010  . DM 04/08/2009  . HYPERLIPIDEMIA 04/08/2009  . Essential hypertension 04/08/2009  . MYOCARDIAL INFARCTION 04/08/2009  . Coronary atherosclerosis 04/08/2009  . GERD 04/08/2009  . FATTY LIVER DISEASE 04/08/2009  . COLONIC POLYPS, HX OF 04/08/2009   Donato Heinz. Owens Shark PT  Norwood Levo 10/01/2018, 1:29 PM  [place], [place], [address] Phone:  [phone]   Fax:  [phone]  Name: [name] MRN: [id number] Date of Birth: January 04, 1951

## 2018-10-02 ENCOUNTER — Telehealth: Payer: Self-pay | Admitting: *Deleted

## 2018-10-02 ENCOUNTER — Emergency Department (HOSPITAL_COMMUNITY): Payer: Medicare Other

## 2018-10-02 ENCOUNTER — Encounter (HOSPITAL_COMMUNITY): Payer: Self-pay

## 2018-10-02 ENCOUNTER — Other Ambulatory Visit: Payer: Self-pay

## 2018-10-02 ENCOUNTER — Inpatient Hospital Stay (HOSPITAL_COMMUNITY)
Admission: EM | Admit: 2018-10-02 | Discharge: 2018-10-07 | DRG: 445 | Disposition: A | Discharge status: Home / Self Care | Payer: Medicare Other | Attending: Internal Medicine | Admitting: Internal Medicine

## 2018-10-02 DIAGNOSIS — J45909 Unspecified asthma, uncomplicated: Secondary | ICD-10-CM | POA: Diagnosis present

## 2018-10-02 DIAGNOSIS — I252 Old myocardial infarction: Secondary | ICD-10-CM | POA: Diagnosis not present

## 2018-10-02 DIAGNOSIS — I2510 Atherosclerotic heart disease of native coronary artery without angina pectoris: Secondary | ICD-10-CM | POA: Diagnosis present

## 2018-10-02 DIAGNOSIS — Z170 Estrogen receptor positive status [ER+]: Secondary | ICD-10-CM

## 2018-10-02 DIAGNOSIS — D631 Anemia in chronic kidney disease: Secondary | ICD-10-CM | POA: Diagnosis present

## 2018-10-02 DIAGNOSIS — M1990 Unspecified osteoarthritis, unspecified site: Secondary | ICD-10-CM | POA: Diagnosis present

## 2018-10-02 DIAGNOSIS — Z87891 Personal history of nicotine dependence: Secondary | ICD-10-CM

## 2018-10-02 DIAGNOSIS — Z8249 Family history of ischemic heart disease and other diseases of the circulatory system: Secondary | ICD-10-CM | POA: Diagnosis not present

## 2018-10-02 DIAGNOSIS — Z79890 Hormone replacement therapy (postmenopausal): Secondary | ICD-10-CM

## 2018-10-02 DIAGNOSIS — C50411 Malignant neoplasm of upper-outer quadrant of right female breast: Secondary | ICD-10-CM | POA: Diagnosis present

## 2018-10-02 DIAGNOSIS — Z20828 Contact with and (suspected) exposure to other viral communicable diseases: Secondary | ICD-10-CM | POA: Diagnosis present

## 2018-10-02 DIAGNOSIS — K82A2 Perforation of gallbladder in cholecystitis: Secondary | ICD-10-CM | POA: Diagnosis present

## 2018-10-02 DIAGNOSIS — R1011 Right upper quadrant pain: Secondary | ICD-10-CM | POA: Diagnosis not present

## 2018-10-02 DIAGNOSIS — E785 Hyperlipidemia, unspecified: Secondary | ICD-10-CM | POA: Diagnosis present

## 2018-10-02 DIAGNOSIS — K8050 Calculus of bile duct without cholangitis or cholecystitis without obstruction: Secondary | ICD-10-CM | POA: Diagnosis present

## 2018-10-02 DIAGNOSIS — Z833 Family history of diabetes mellitus: Secondary | ICD-10-CM

## 2018-10-02 DIAGNOSIS — E1122 Type 2 diabetes mellitus with diabetic chronic kidney disease: Secondary | ICD-10-CM | POA: Diagnosis present

## 2018-10-02 DIAGNOSIS — E876 Hypokalemia: Secondary | ICD-10-CM | POA: Diagnosis not present

## 2018-10-02 DIAGNOSIS — Z79899 Other long term (current) drug therapy: Secondary | ICD-10-CM | POA: Diagnosis not present

## 2018-10-02 DIAGNOSIS — K921 Melena: Secondary | ICD-10-CM | POA: Diagnosis present

## 2018-10-02 DIAGNOSIS — Z955 Presence of coronary angioplasty implant and graft: Secondary | ICD-10-CM | POA: Diagnosis not present

## 2018-10-02 DIAGNOSIS — K8067 Calculus of gallbladder and bile duct with acute and chronic cholecystitis with obstruction: Principal | ICD-10-CM | POA: Diagnosis present

## 2018-10-02 DIAGNOSIS — K219 Gastro-esophageal reflux disease without esophagitis: Secondary | ICD-10-CM | POA: Diagnosis present

## 2018-10-02 DIAGNOSIS — R945 Abnormal results of liver function studies: Secondary | ICD-10-CM | POA: Diagnosis not present

## 2018-10-02 DIAGNOSIS — R17 Unspecified jaundice: Secondary | ICD-10-CM | POA: Diagnosis not present

## 2018-10-02 DIAGNOSIS — K8051 Calculus of bile duct without cholangitis or cholecystitis with obstruction: Secondary | ICD-10-CM | POA: Diagnosis not present

## 2018-10-02 DIAGNOSIS — Z8673 Personal history of transient ischemic attack (TIA), and cerebral infarction without residual deficits: Secondary | ICD-10-CM

## 2018-10-02 DIAGNOSIS — Z7982 Long term (current) use of aspirin: Secondary | ICD-10-CM | POA: Diagnosis not present

## 2018-10-02 DIAGNOSIS — Z9011 Acquired absence of right breast and nipple: Secondary | ICD-10-CM | POA: Diagnosis not present

## 2018-10-02 DIAGNOSIS — N183 Chronic kidney disease, stage 3 (moderate): Secondary | ICD-10-CM | POA: Diagnosis present

## 2018-10-02 DIAGNOSIS — Z8719 Personal history of other diseases of the digestive system: Secondary | ICD-10-CM | POA: Diagnosis not present

## 2018-10-02 DIAGNOSIS — I129 Hypertensive chronic kidney disease with stage 1 through stage 4 chronic kidney disease, or unspecified chronic kidney disease: Secondary | ICD-10-CM | POA: Diagnosis present

## 2018-10-02 DIAGNOSIS — K819 Cholecystitis, unspecified: Secondary | ICD-10-CM | POA: Diagnosis not present

## 2018-10-02 DIAGNOSIS — K760 Fatty (change of) liver, not elsewhere classified: Secondary | ICD-10-CM | POA: Diagnosis present

## 2018-10-02 DIAGNOSIS — Z794 Long term (current) use of insulin: Secondary | ICD-10-CM

## 2018-10-02 DIAGNOSIS — R1013 Epigastric pain: Secondary | ICD-10-CM | POA: Diagnosis not present

## 2018-10-02 DIAGNOSIS — K838 Other specified diseases of biliary tract: Secondary | ICD-10-CM

## 2018-10-02 DIAGNOSIS — Z9049 Acquired absence of other specified parts of digestive tract: Secondary | ICD-10-CM

## 2018-10-02 DIAGNOSIS — R7989 Other specified abnormal findings of blood chemistry: Secondary | ICD-10-CM

## 2018-10-02 LAB — CBC
HCT: 33.6 % — ABNORMAL LOW (ref 36.0–46.0)
Hemoglobin: 10.6 g/dL — ABNORMAL LOW (ref 12.0–15.0)
MCH: 31.5 pg (ref 26.0–34.0)
MCHC: 31.5 g/dL (ref 30.0–36.0)
MCV: 100 fL (ref 80.0–100.0)
Platelets: 363 10*3/uL (ref 150–400)
RBC: 3.36 MIL/uL — ABNORMAL LOW (ref 3.87–5.11)
RDW: 15.5 % (ref 11.5–15.5)
WBC: 6.8 10*3/uL (ref 4.0–10.5)
nRBC: 0.3 % — ABNORMAL HIGH (ref 0.0–0.2)

## 2018-10-02 LAB — COMPREHENSIVE METABOLIC PANEL
ALT: 267 U/L — ABNORMAL HIGH (ref 0–44)
AST: 280 U/L — ABNORMAL HIGH (ref 15–41)
Albumin: 3.2 g/dL — ABNORMAL LOW (ref 3.5–5.0)
Alkaline Phosphatase: 879 U/L — ABNORMAL HIGH (ref 38–126)
Anion gap: 10 (ref 5–15)
BUN: 15 mg/dL (ref 8–23)
CO2: 20 mmol/L — ABNORMAL LOW (ref 22–32)
Calcium: 8.4 mg/dL — ABNORMAL LOW (ref 8.9–10.3)
Chloride: 110 mmol/L (ref 98–111)
Creatinine, Ser: 1.37 mg/dL — ABNORMAL HIGH (ref 0.44–1.00)
GFR calc Af Amer: 45 mL/min — ABNORMAL LOW (ref >60)
GFR calc non Af Amer: 39 mL/min — ABNORMAL LOW (ref >60)
Glucose, Bld: 115 mg/dL — ABNORMAL HIGH (ref 70–99)
Potassium: 3.4 mmol/L — ABNORMAL LOW (ref 3.5–5.1)
Sodium: 140 mmol/L (ref 135–145)
Total Bilirubin: 3.7 mg/dL — ABNORMAL HIGH (ref 0.3–1.2)
Total Protein: 7.1 g/dL (ref 6.5–8.1)

## 2018-10-02 LAB — TROPONIN I (HIGH SENSITIVITY)
Troponin I (High Sensitivity): 9 ng/L (ref <18)
Troponin I (High Sensitivity): 9 ng/L (ref <18)

## 2018-10-02 LAB — GLUCOSE, CAPILLARY: Glucose-Capillary: 97 mg/dL (ref 70–99)

## 2018-10-02 LAB — LACTIC ACID, PLASMA: Lactic Acid, Venous: 0.6 mmol/L (ref 0.5–1.9)

## 2018-10-02 LAB — SARS CORONAVIRUS 2 (TAT 6-24 HRS): SARS Coronavirus 2: NEGATIVE

## 2018-10-02 LAB — LIPASE, BLOOD: Lipase: 82 U/L — ABNORMAL HIGH (ref 11–51)

## 2018-10-02 MED ORDER — ENOXAPARIN SODIUM 30 MG/0.3ML ~~LOC~~ SOLN
30.0000 mg | SUBCUTANEOUS | Status: DC
  Administered 2018-10-02: 30 mg via SUBCUTANEOUS
  Filled 2018-10-02: qty 0.3

## 2018-10-02 MED ORDER — ALBUTEROL SULFATE (2.5 MG/3ML) 0.083% IN NEBU: 2.5000 mg | INHALATION_SOLUTION | Freq: Four times a day (QID) | RESPIRATORY_TRACT | Status: DC | PRN

## 2018-10-02 MED ORDER — MOMETASONE FURO-FORMOTEROL FUM 200-5 MCG/ACT IN AERO
2.0000 | INHALATION_SPRAY | Freq: Two times a day (BID) | RESPIRATORY_TRACT | Status: DC
  Administered 2018-10-02 – 2018-10-07 (×10): 2 via RESPIRATORY_TRACT
  Filled 2018-10-02: qty 8.8

## 2018-10-02 MED ORDER — SODIUM CHLORIDE 0.9 % IV SOLN
INTRAVENOUS | Status: DC
  Administered 2018-10-02 – 2018-10-05 (×6): via INTRAVENOUS

## 2018-10-02 MED ORDER — METRONIDAZOLE IN NACL 5-0.79 MG/ML-% IV SOLN
500.0000 mg | Freq: Once | INTRAVENOUS | Status: AC
  Administered 2018-10-02: 500 mg via INTRAVENOUS
  Filled 2018-10-02: qty 100

## 2018-10-02 MED ORDER — INSULIN ASPART 100 UNIT/ML ~~LOC~~ SOLN
0.0000 [IU] | Freq: Three times a day (TID) | SUBCUTANEOUS | Status: DC
  Administered 2018-10-04: 2 [IU] via SUBCUTANEOUS
  Administered 2018-10-04 (×2): 1 [IU] via SUBCUTANEOUS
  Administered 2018-10-05: 3 [IU] via SUBCUTANEOUS
  Administered 2018-10-06 (×2): 2 [IU] via SUBCUTANEOUS
  Administered 2018-10-07: 9 [IU] via SUBCUTANEOUS
  Administered 2018-10-07: 1 [IU] via SUBCUTANEOUS
  Filled 2018-10-02: qty 0.09

## 2018-10-02 MED ORDER — IOHEXOL 300 MG/ML  SOLN
100.0000 mL | Freq: Once | INTRAMUSCULAR | Status: AC | PRN
  Administered 2018-10-02: 80 mL via INTRAVENOUS

## 2018-10-02 MED ORDER — ACETAMINOPHEN 325 MG PO TABS
650.0000 mg | ORAL_TABLET | ORAL | Status: DC | PRN
  Administered 2018-10-06: 650 mg via ORAL
  Filled 2018-10-02: qty 2

## 2018-10-02 MED ORDER — ONDANSETRON HCL 4 MG/2ML IJ SOLN: 4.0000 mg | Freq: Four times a day (QID) | INTRAMUSCULAR | Status: DC | PRN

## 2018-10-02 MED ORDER — HYDROMORPHONE HCL 1 MG/ML IJ SOLN
0.5000 mg | INTRAMUSCULAR | Status: DC | PRN
  Administered 2018-10-02 – 2018-10-05 (×5): 0.5 mg via INTRAVENOUS
  Filled 2018-10-02 (×5): qty 0.5

## 2018-10-02 MED ORDER — HYDROMORPHONE HCL 1 MG/ML IJ SOLN
1.0000 mg | Freq: Once | INTRAMUSCULAR | Status: AC
  Administered 2018-10-02: 1 mg via INTRAVENOUS
  Filled 2018-10-02: qty 1

## 2018-10-02 MED ORDER — ALPRAZOLAM 0.25 MG PO TABS
0.2500 mg | ORAL_TABLET | Freq: Every day | ORAL | Status: DC
  Administered 2018-10-02 – 2018-10-06 (×5): 0.25 mg via ORAL
  Filled 2018-10-02 (×5): qty 1

## 2018-10-02 MED ORDER — METOPROLOL SUCCINATE ER 50 MG PO TB24
50.0000 mg | ORAL_TABLET | Freq: Every day | ORAL | Status: DC
  Administered 2018-10-02 – 2018-10-03 (×2): 50 mg via ORAL
  Filled 2018-10-02 (×2): qty 1

## 2018-10-02 MED ORDER — HYDRALAZINE HCL 20 MG/ML IJ SOLN: 5.0000 mg | Freq: Four times a day (QID) | INTRAMUSCULAR | Status: DC | PRN

## 2018-10-02 MED ORDER — INSULIN ASPART 100 UNIT/ML ~~LOC~~ SOLN
0.0000 [IU] | Freq: Every day | SUBCUTANEOUS | Status: DC
  Filled 2018-10-02: qty 0.05

## 2018-10-02 MED ORDER — SODIUM CHLORIDE 0.9 % IV BOLUS
500.0000 mL | Freq: Once | INTRAVENOUS | Status: AC
  Administered 2018-10-02: 500 mL via INTRAVENOUS

## 2018-10-02 MED ORDER — NITROGLYCERIN 0.4 MG SL SUBL: 0.4000 mg | SUBLINGUAL_TABLET | SUBLINGUAL | Status: DC | PRN

## 2018-10-02 MED ORDER — PIPERACILLIN-TAZOBACTAM 3.375 G IVPB
3.3750 g | Freq: Three times a day (TID) | INTRAVENOUS | Status: DC
  Administered 2018-10-02 – 2018-10-07 (×14): 3.375 g via INTRAVENOUS
  Filled 2018-10-02 (×15): qty 50

## 2018-10-02 MED ORDER — SODIUM CHLORIDE (PF) 0.9 % IJ SOLN
INTRAMUSCULAR | Status: AC
  Filled 2018-10-02: qty 50

## 2018-10-02 MED ORDER — ATORVASTATIN CALCIUM 20 MG PO TABS: 20.0000 mg | ORAL_TABLET | Freq: Every day | ORAL | Status: DC

## 2018-10-02 MED ORDER — SODIUM CHLORIDE 0.9 % IV SOLN
2.0000 g | Freq: Once | INTRAVENOUS | Status: AC
  Administered 2018-10-02: 2 g via INTRAVENOUS
  Filled 2018-10-02: qty 20

## 2018-10-02 MED ORDER — HYDROMORPHONE HCL 1 MG/ML IJ SOLN
0.5000 mg | Freq: Once | INTRAMUSCULAR | Status: AC
  Administered 2018-10-02: 0.5 mg via INTRAVENOUS
  Filled 2018-10-02: qty 1

## 2018-10-02 MED ORDER — HYDROMORPHONE HCL 1 MG/ML IJ SOLN: 1.0000 mg | INTRAMUSCULAR | Status: DC | PRN

## 2018-10-02 MED ORDER — AMLODIPINE BESYLATE 5 MG PO TABS
5.0000 mg | ORAL_TABLET | Freq: Every day | ORAL | Status: DC
  Administered 2018-10-02 – 2018-10-07 (×6): 5 mg via ORAL
  Filled 2018-10-02 (×6): qty 1

## 2018-10-02 NOTE — H&P (Signed)
History and Physical    [name] FXT:[id number] DOB: 12-07-1950 DOA: 10/02/2018  PCP: Tsosie Billing, MD (Inactive)  Patient coming from: [facility].   I have personally briefly reviewed patient's old medical records in [place]  Chief Complaint: abdominal pain.   HPI: [name] is a 68 y.o. female with medical history significant of chronic cholelithiasis, coronary artery disease, asthma, GERD, hypertension, hyperlipidemia, iron deficiency anemia was recently discharged from the hospital after being treated for acute cholecystitis with IV antibiotics and conservative management presents today with persistent abdominal pain for the last 2 to 3 days associated with some nausea and dry heaving.  When she denies any fevers or chills, shortness of breath, chest pain or cough.  She reports some loose bowel movements.  She denies any headache, vision changes, dizziness, syncope, dysuria, hematochezia or hematemesis.  ED Course: Arrival to ED she was afebrile tachypneic, hypertensive S high as 184/100 improved to 155/72.  Labs revealed potassium of 3.4 bicarb of 20, glucose of 115, creatinine of 1.37, alk phos of 879,, albumin of 3.2, lipase of 82, AST of 280, ALT of 267, lactic acid 0.6, hemoglobin of 10.6.  COVID-19 screening test is pending.  She was given a liter of normal saline fluids and IV Rocephin and IV Flagyl.  CT of the abdomen and pelvis shows Cholelithiasis with gallbladder wall thickening likely representing acute cholecystitis. Intrahepatic and extrahepatic biliary dilatation to the ampulla with the CBD now measuring 14 mm. Increased density within the CBD may represent sludge versus Choledocholithiasis.  EKG shows sinus rhythm with left ventricular hypertrophy and Q waves. Surgery and gastroenterologist Dr. Benson Norway consulted for further evaluation.  She was recommended to be n.p.o.  She was referred to [facility] for further evaluation and management of  acute choledocholithiasis.   Review of Systems: As per HPI " "All others reviewed and are negative,"   Past Medical History:  Diagnosis Date  . Angina   . Arthritis    HANDS"  . Asthma   . Cancer ([place]) 09/04/2017   Right Breast  . Carpal tunnel syndrome, bilateral 11/26/2016  . Cholelithiasis   . Cocaine abuse ([place]) 07/2012   per E.R. drug screen  . Coronary artery disease   . Diabetes mellitus without mention of complication    type 2  . Dysphagia    esophageal dysmotility on 03/2011 esophagram   . Dyspnea    ongoing   . Fatty liver disease, nonalcoholic 2992   on Imaging.   Marland Kitchen GERD (gastroesophageal reflux disease)   . Headache   . History of colonic polyps 2009   adenomatous 2009, HP in 2009, 2011, 2013.   Marland Kitchen Hyperlipidemia   . Hypertension   . Iron deficiency anemia   . Myocardial infarction ([place])    years ago, 6 stents placed  . 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 ABDOMINAL ANGIOGRAM  02/20/2011   Procedure: ABDOMINAL ANGIOGRAM;  Surgeon: Clent Demark, MD;  Location: [facility] CATH LAB;  Service: Cardiovascular;;  . CARPAL TUNNEL RELEASE Right 07/04/2017   Procedure: RIGHT CARPAL TUNNEL RELEASE;  Surgeon: Leanora Cover, MD;  Location: [place];  Service: Orthopedics;  Laterality: Right;  . CARPAL TUNNEL RELEASE Left 09/12/2017   Procedure: LEFT CARPAL TUNNEL RELEASE;  Surgeon: Leanora Cover, MD;  Location: [place];  Service: Orthopedics;  Laterality: Left;  . COLONOSCOPY  11/30/2011   Procedure: COLONOSCOPY;  Surgeon: Lafayette Dragon, MD;  Location: [facility];  Service: Endoscopy;  Laterality: N/A;  . CORONARY ANGIOPLASTY WITH STENT PLACEMENT    . ESOPHAGOGASTRODUODENOSCOPY  11/30/2011   Procedure: ESOPHAGOGASTRODUODENOSCOPY (EGD);  Surgeon: Lafayette Dragon, MD;  Location: [facility]OS[facility];  Service: Endoscopy;  Laterality: N/A;  . ESOPHAGOGASTRODUODENOSCOPY N/A 02/23/2015    Procedure: ESOPHAGOGASTRODUODENOSCOPY (EGD);  Surgeon: Gatha Mayer, MD;  Location: [facility] ENDOSCOPY;  Service: Endoscopy;  Laterality: N/A;  . LEFT HEART CATHETERIZATION WITH CORONARY ANGIOGRAM N/A 02/20/2011   Procedure: LEFT HEART CATHETERIZATION WITH CORONARY ANGIOGRAM;  Surgeon: Clent Demark, MD;  Location: [place] CATH LAB;  Service: Cardiovascular;  Laterality: N/A;  . MASTECTOMY Right 11/05/2017  . MASTECTOMY W/ SENTINEL NODE BIOPSY Right 11/27/2017  . MASTECTOMY W/ SENTINEL NODE BIOPSY Right 11/27/2017   Procedure: RIGHT TOTAL MASTECTOMY WITH SENTINEL LYMPH NODE BIOPSY;  Surgeon: Excell Seltzer, MD;  Location: [place];  Service: General;  Laterality: Right;  . PORT A CATH REVISION Left 05/07/2018   Procedure: PORT A CATH REVISION;  Surgeon: Excell Seltzer, MD;  Location: [facility];  Service: General;  Laterality: Left;  . PORTACATH PLACEMENT Left 11/27/2017   Procedure: INSERTION PORT-A-CATH;  Surgeon: Excell Seltzer, MD;  Location: [place];  Service: General;  Laterality: Left;  . RIGHT COLECTOMY  02/2007   for post polypectomy colonic perforation  . TUBAL LIGATION     Social history  reports that she quit smoking about 8 years ago. Her smoking use included cigarettes. She has a 23.50 pack-year smoking history. She has never used smokeless tobacco. She reports previous drug use. Drug: Cocaine. She reports that she does not drink alcohol.  No Known Allergies  Family History  Problem Relation Age of Onset  . Heart disease Mother   . Diabetes Mother   . Cancer Father        unsure what kind  . Diabetes Sister   . Colon cancer Neg Hx   . Breast cancer Neg Hx    Family history reviewed and not pertinent Prior to Admission medications   Medication Sig Start Date End Date Taking? Authorizing Provider  acetaminophen (TYLENOL) 325 MG tablet Take 2 tablets (650 mg total) by mouth every 4 (four) hours as needed for headache or mild pain. 07/14/18  Yes Charolette Forward, MD  albuterol  (PROVENTIL) (2.5 MG/3ML) 0.083% nebulizer solution INHALE THE CONTENTS OF 1 VIAL USING NEBULIZER EVERY 6 HOURS AS NEEDEDFOR WHEEZING OR SHORTNESS OF BREATH Patient taking differently: Take 2.5 mg by nebulization every 6 (six) hours as needed for wheezing or shortness of breath.  08/07/18  Yes Nicholas Lose, MD  allopurinol (ZYLOPRIM) 100 MG tablet Take 100 mg by mouth daily. 09/17/18  Yes [provider]  ALPRAZolam Duanne Moron) 0.25 MG tablet Take 0.25 mg by mouth at bedtime.    Yes [provider]  amLODipine (NORVASC) 5 MG tablet Take 5 mg by mouth daily.   Yes [provider]  anastrozole (ARIMIDEX) 1 MG tablet Take 1 tablet (1 mg total) by mouth daily. 02/27/18  Yes Nicholas Lose, MD  aspirin EC 81 MG EC tablet Take 1 tablet (81 mg total) by mouth daily. 02/06/12  Yes Patrecia Pour, NP  atorvastatin (LIPITOR) 20 MG tablet Take 20 mg by mouth daily at 6 PM.    Yes [provider]  budesonide-formoterol (SYMBICORT) 160-4.5 MCG/ACT inhaler Inhale 1 puff into the lungs 2 (two) times daily.    Yes [provider]  colchicine 0.6 MG tablet Take  0.6 mg by mouth daily as needed for pain. 07/17/18  Yes [provider]  feeding supplement, ENSURE ENLIVE, (ENSURE ENLIVE) LIQD Take 237 mLs by mouth 2 (two) times daily between meals. 02/24/15  Yes Ghimire, Henreitta Leber, MD  LANTUS SOLOSTAR 100 UNIT/ML Solostar Pen Inject 10 Units into the skin daily. 07/24/18  Yes [provider]  loperamide (IMODIUM) 2 MG capsule Take 1 capsule (2 mg total) by mouth 4 (four) times daily as needed for diarrhea or loose stools. 02/27/15  Yes Marella Chimes, PA-C  metoprolol succinate (TOPROL-XL) 50 MG 24 hr tablet Take 50 mg by mouth daily. Take with or immediately following a meal.   Yes [provider]  nitroGLYCERIN (NITROSTAT) 0.4 MG SL tablet Place 1 tablet (0.4 mg total) under the tongue every 5 (five) minutes as needed. For chest pain Patient taking  differently: Place 0.4 mg under the tongue every 5 (five) minutes as needed for chest pain.  02/06/12  Yes Patrecia Pour, NP  Omega-3 Fatty Acids (FISH OIL) 1000 MG CAPS Take 1,000 mg by mouth daily.   Yes [provider]  ondansetron (ZOFRAN) 8 MG tablet Take 1 tablet (8 mg total) by mouth every 8 (eight) hours as needed for nausea or vomiting. 06/16/18  Yes Nicholas Lose, MD  saccharomyces boulardii (FLORASTOR) 250 MG capsule Take 1 capsule (250 mg total) by mouth 2 (two) times daily for 15 days. 09/25/18 10/10/18 Yes Florencia Reasons, MD  traMADol (ULTRAM) 50 MG tablet Take 50-100 mg by mouth every 6 (six) hours as needed for pain. 09/17/18  Yes [provider]    Physical Exam:  Constitutional: NAD, calm, comfortable Vitals:   10/02/18 1432 10/02/18 1500 10/02/18 1530 10/02/18 1600  BP: (!) 148/69 (!) 152/77 (!) 152/74 (!) 145/84  Pulse: 64 (!) 54 66   Resp: _0 Temp:      TempSrc:      SpO2: 95% 97% 94%   Weight:      Height:       Eyes: PERRL, lids and conjunctivae normal ENMT: Mucous membranes are dry.  Neck: normal, supple,  Respiratory: clear to auscultation bilaterally, no wheezing, no crackles. Normal respiratory effort.Cardiovascular: Regular rate and rhythm, no murmurs / rubs / gallops. No extremity edema.  Abdomen: soft generalized tenderness but more in the right upper quadrant . Bowel sounds heard.   Musculoskeletal: no clubbing / cyanosis. No joint deformity upper and lower extremities. Skin: no rashes, lesions, ulcers. No induration Neurologic: CN 2-12 grossly intact.  Psychiatric: Normal judgment and insight. Alert and oriented x 3. Normal mood.    Labs on Admission: I have personally reviewed following labs and imaging studies  CBC: Recent Labs  Lab 10/02/18 1020  WBC 6.8  HGB 10.6*  HCT 33.6*  MCV 100.0  PLT 520   Basic Metabolic Panel: Recent Labs  Lab 10/02/18 1020  NA 140  K 3.4*  CL 110  CO2 20*  GLUCOSE 115*  BUN 15   CREATININE 1.37*  CALCIUM 8.4*   GFR: Estimated Creatinine Clearance: 29.7 mL/min (A) (by C-G formula based on SCr of 1.37 mg/dL (H)). Liver Function Tests: Recent Labs  Lab 10/02/18 1020  AST 280*  ALT 267*  ALKPHOS 879*  BILITOT 3.7*  PROT 7.1  ALBUMIN 3.2*   Recent Labs  Lab 10/02/18 1020  LIPASE 82*   No results for input(s): AMMONIA in the last 168 hours. Coagulation Profile: No results for input(s): INR, PROTIME  in the last 168 hours. Cardiac Enzymes: No results for input(s): CKTOTAL, CKMB, CKMBINDEX, TROPONINI in the last 168 hours. BNP (last 3 results) No results for input(s): PROBNP in the last 8760 hours. HbA1C: No results for input(s): HGBA1C in the last 72 hours. CBG: No results for input(s): GLUCAP in the last 168 hours. Lipid Profile: No results for input(s): CHOL, HDL, LDLCALC, TRIG, CHOLHDL, LDLDIRECT in the last 72 hours. Thyroid Function Tests: No results for input(s): TSH, T4TOTAL, FREET4, T3FREE, THYROIDAB in the last 72 hours. Anemia Panel: No results for input(s): VITAMINB12, FOLATE, FERRITIN, TIBC, IRON, RETICCTPCT in the last 72 hours. Urine analysis:    Component Value Date/Time   COLORURINE YELLOW 11/27/2015 0743   APPEARANCEUR CLOUDY (A) 11/27/2015 0743   LABSPEC 1.019 11/27/2015 0743   PHURINE 6.0 11/27/2015 0743   GLUCOSEU NEGATIVE 11/27/2015 0743   HGBUR NEGATIVE 11/27/2015 0743   BILIRUBINUR NEGATIVE 11/27/2015 0743   KETONESUR NEGATIVE 11/27/2015 0743   PROTEINUR NEGATIVE 11/27/2015 0743   UROBILINOGEN 0.2 03/31/2014 2017   NITRITE NEGATIVE 11/27/2015 0743   LEUKOCYTESUR MODERATE (A) 11/27/2015 0743    Radiological Exams on Admission: Ct Abdomen Pelvis W Contrast  Result Date: 10/02/2018 CLINICAL DATA:  68 year old female with acute abdominal and pelvic pain and nausea. EXAM: CT ABDOMEN AND PELVIS WITH CONTRAST TECHNIQUE: Multidetector CT imaging of the abdomen and pelvis was performed using the standard protocol following  bolus administration of intravenous contrast. CONTRAST:  23m OMNIPAQUE IOHEXOL 300 MG/ML  SOLN COMPARISON:  09/18/2018 CT FINDINGS: Lower chest: Bibasilar atelectasis is identified. Cardiomegaly again noted. Hepatobiliary: Cholelithiasis with gallbladder wall thickening again noted. There is intrahepatic and extrahepatic biliary dilatation to the ampulla with the CBD measuring up to 14 mm in greatest diameter. Slight increased density within the visualized CBD is noted and may represent sludge versus choledocholithiasis. Pancreas: Unremarkable Spleen: Unremarkable Adrenals/Urinary Tract: The kidneys, adrenal glands and bladder are unremarkable except for unchanged bilateral renal cortical thinning and probable tiny bilateral renal cysts. There is no evidence of hydronephrosis or obstructing urinary calculi. Stomach/Bowel: Stomach is within normal limits. Surgical changes along the RIGHT colon again noted. No evidence of bowel obstruction. No evidence of bowel wall thickening, distention, or inflammatory changes. Vascular/Lymphatic: Aortic atherosclerosis. No enlarged abdominal or pelvic lymph nodes. Reproductive: Unremarkable except for a stable calcified RIGHT uterine fibroid. Other: No ascites, focal collection or pneumoperitoneum. Small bilateral inguinal hernias containing fat are again noted. Musculoskeletal: No acute or suspicious bony abnormality. IMPRESSION: 1. Cholelithiasis with gallbladder wall thickening likely representing acute cholecystitis. Intrahepatic and extrahepatic biliary dilatation to the ampulla with the CBD now measuring 14 mm. Increased density within the CBD may represent sludge versus choledocholithiasis. 2. Cardiomegaly and bibasilar atelectasis. 3. Unchanged small bilateral inguinal hernias containing fat. 4.  Aortic Atherosclerosis (ICD10-I70.0). Electronically Signed   By: JMargarette CanadaM.D.   On: 10/02/2018 14:21   UKoreaAbdomen Limited Ruq  Result Date: 10/02/2018 CLINICAL DATA:   Severe abdominal pain EXAM: ULTRASOUND ABDOMEN LIMITED RIGHT UPPER QUADRANT COMPARISON:  Ultrasound right upper quadrant September 19, 2018; CT abdomen and pelvis September 18 2018 and abdominal MRI September 18, 2018 FINDINGS: Gallbladder: There is a 1.7 cm calcification within the gallbladder consistent with a large gallstone. The gallbladder wall remains thickened edematous with localized fluid which may represent loculated perforation of the gallbladder. Patient is focally tender over the gallbladder. Common bile duct: Diameter: 7 mm. There is slight intrahepatic biliary duct dilatation. Liver: No focal lesion identified. Within normal limits in parenchymal  echogenicity. Portal vein is patent on color Doppler imaging with normal direction of blood flow towards the liver. Other: None. IMPRESSION: Findings in the gallbladder indicative of acute cholecystitis with questionable loculated area of perforation. Common bile duct upper normal in size. Slight degree of intrahepatic biliary duct dilatation felt to be present. Electronically Signed   By: Lowella Grip III M.D.   On: 10/02/2018 11:38    EKG: Independently reviewed. Sinus rhythm, left ventricular hypertrophy.   Assessment/Plan Active Problems:   Choledocholithiasis  Acute choledocholithiasis with acute cholecystitis Admitted for IV antibiotics and waiting for further evaluation by GI and surgery. She will probably need ERCP.  She will be started on IV Zosyn and continue with IV normal saline 75 mL/h Keep her n.p.o. Pain control and IV anti emetics.    Anemia of chronic disease. / iron deficiency anemia.  Hemoglobin around 10. Baseline around 9. Probably hemo concentrated from dehydration.   H/o CAD: S/p stent. She denies any chest pain or sob.    Hypertension:  Not well controlled.  Probably sec to abdominal pain. Added IV hydralazine prn.    gerd Continue with PPI.    Stage III CKD  creatinine at baseline . Continue to  monitor   Asthma No wheezing heard on exam Continue with albuterol and Dulera as needed  Severity of Illness: The appropriate patient status for this patient is INPATIENT. Inpatient status is judged to be reasonable and necessary in order to provide the required intensity of service to ensure the patient's safety. The patient's presenting symptoms, physical exam findings, and initial radiographic and laboratory data in the context of their chronic comorbidities is felt to place them at high risk for further clinical deterioration. Furthermore, it is not anticipated that the patient will be medically stable for discharge from the hospital within 2 midnights of admission. The following factors support the patient status of inpatient.   " The patient's presenting symptoms include ABDOMINAL PAIN. " The worrisome physical exam findings include right upper quadrant tenderness.  " The initial radiographic and laboratory data are worrisome because of acute cholecystitis and choledocholithiasis.  " The chronic co-morbidities include CAD.    * I certify that at the point of admission it is my clinical judgment that the patient will require inpatient hospital care spanning beyond 2 midnights from the point of admission due to high intensity of service, high risk for further deterioration and high frequency of surveillance required.*    DVT prophylaxis: Lovenox Code Status: Full code Family Communication: Discussed with the patient's sister Disposition Plan: Pending further work-up Consults called: Surgery and gastroenterology Admission status: Inpatient/telemetry   Hosie Poisson MD [facility] Pager [phone]   If 7PM-7AM, please contact night-coverage [email or web address] Password [email or web address]  10/02/2018, 4:08 PM

## 2018-10-02 NOTE — Progress Notes (Signed)
Pharmacy Antibiotic Note  [name] is a 68 y.o. female admitted on 10/02/2018 with intra-abdominal infection.  Pharmacy has been consulted for Zosyn dosing. PMH significant for CKD- Scr 1.37 with estimated CrCl >12ml/min.    Plan:  Zosyn 3.375g IV q8h (4 hour infusion).  Monitor renal function and cx data   Height: 4\' 11"  (149.9 cm) Weight: 128 lb (58.1 kg) IBW/kg (Calculated) : 43.2  Temp (24hrs), Avg:98.4 F (36.9 C), Min:98.4 F (36.9 C), Max:98.4 F (36.9 C)  Recent Labs  Lab 10/02/18 1020 10/02/18 1033  WBC 6.8  --   CREATININE 1.37*  --   LATICACIDVEN  --  0.6    Estimated Creatinine Clearance: 29.7 mL/min (A) (by C-G formula based on SCr of 1.37 mg/dL (H)).    No Known Allergies  Antimicrobials this admission: 8/27 Zosyn >>  8/27 Rocephin/Flagyl x1 dose in ED  Dose adjustments this admission:  Microbiology results: 8/27 COVID:  Thank you for allowing pharmacy to be a part of this patient's care.  Biagio Borg 10/02/2018 5:57 PM

## 2018-10-02 NOTE — ED Triage Notes (Signed)
Pt to ED via EMS from home, pt c/o abdominal pain that started around 0400 that woke her up, pt was here a week ago for the same complaint. Pt scheduled to have sx for gaul bladder removal in Sep. Pain is the same as when she was here previously. Also c/o nausea.

## 2018-10-02 NOTE — ED Notes (Signed)
Patient transported to CT 

## 2018-10-02 NOTE — Consult Note (Addendum)
[facility] Surgery Consult Note  [name] 01/18/1951  [id number].    Requesting MD: Gerlene Fee Chief Complaint:  Abdominal pain Reason for Consult: Cholecystitis with possible loculated perforation, cholelithiasis  HPI: [name] is a 68 y.o. 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   Patient was recently hospitalized 8/13 - 09/25/18.  Work-up at that time showed CT with cholelithiasis with diffuse gallbladder wall thickening and edema.  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  There was also noted to be dilation of the common hepatic and common bile ducts with concerns of a mass at the ampulla causing this.  She underwent a MRCP to evaluate these findings that did not show any direct fistulous communication.   It did show cholelithiasis with acute cholecystitis with a 2.8 cm cystic lesion along the gallbladder wall that favored perforation.  There was no evidence of choledocholithiasis.  The pancreas appeared normal.  CA-19-9 was checked and was noted to be elevated at 44.  AFP tumor marker was normal.  She then went underwent a HIDA scan and right upper quadrant ultrasound to further evaluate. HIDA scan was positive for cholecystitis.  Right upper quadrant ultrasound showed large calcified mass seen on the peripheral outer wall of the gallbladder measuring 1.4 x 1.5 x 1.2 cm with diffuse thickened gallbladder wall concerning for cholecystitis. IR Perc Chole drain was recommended but gallbladder was too contracted for IR to drain.  It was felt that because of the inflammation  seen on imaging that surgery would be high risk for injury to the CBD or bowel injury.  She was treated with IV antibiotics and her symptoms improved.  She was discharged home with plans for follow-up with Dr. Ninfa Linden in the office in 6-8 weeks to consider interval cholecystectomy.   Patient presents today with recurrent pain.  She notes that yesterday evening she began having epigastric and right upper quadrant abdominal pain that is constant, severe and worse with movement and palpation.  She notes associated nausea.  She denies associated fever, chills, emesis or changes in bowel habit.  She did not try anything for symptoms prior to arrival.  Work up today shows she is afebrile, WBC 6.8, lipase 82, CMP show rise in LFT's Alk phos 879, AST 280, ALT 267, T Bil is 3.7.  Ultrasound shows 1.7 cm calcification within the gallbladder consistent with a large gallstone.  Gallbladder wall remains thickened and edematous with localized fluid which measures a loculated perforation of the gallbladder, patient is focally tender over the gallbladder per radiology report.  Common bile duct is 7 mm also some slight intrahepatic biliary ductal dilatation.  ROS: Review of Systems  Constitutional: Negative for chills and fever.  Respiratory: Negative for cough and shortness of breath.   Cardiovascular: Negative for chest pain.  Gastrointestinal: Positive for abdominal pain and nausea. Negative for blood in stool, constipation, diarrhea and vomiting.  All other systems reviewed and are negative.   Family History  Problem Relation Age of Onset  . Heart disease Mother   . Diabetes Mother   . Cancer Father  unsure what kind  . Diabetes Sister   . Colon cancer Neg Hx   . Breast cancer Neg Hx     Past Medical History:  Diagnosis Date  . Angina   . Arthritis    HANDS"  . Asthma   . Cancer ([place]) 09/04/2017   Right Breast  . Carpal tunnel syndrome, bilateral 11/26/2016  . Cholelithiasis   . Cocaine  abuse ([place]) 07/2012   per [facility] drug screen  . Coronary artery disease   . Diabetes mellitus without mention of complication    type 2  . Dysphagia    esophageal dysmotility on 03/2011 esophagram   . Dyspnea    ongoing   . Fatty liver disease, nonalcoholic 4163   on Imaging.   Marland Kitchen GERD (gastroesophageal reflux disease)   . Headache   . History of colonic polyps 2009   adenomatous 2009, HP in 2009, 2011, 2013.   Marland Kitchen Hyperlipidemia   . Hypertension   . Iron deficiency anemia   . Myocardial infarction ([place])    years ago, 6 stents placed  . 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 ABDOMINAL ANGIOGRAM  02/20/2011   Procedure: ABDOMINAL ANGIOGRAM;  Surgeon: Clent Demark, MD;  Location: [facility] CATH LAB;  Service: Cardiovascular;;  . CARPAL TUNNEL RELEASE Right 07/04/2017   Procedure: RIGHT CARPAL TUNNEL RELEASE;  Surgeon: Leanora Cover, MD;  Location: [place];  Service: Orthopedics;  Laterality: Right;  . CARPAL TUNNEL RELEASE Left 09/12/2017   Procedure: LEFT CARPAL TUNNEL RELEASE;  Surgeon: Leanora Cover, MD;  Location: [place];  Service: Orthopedics;  Laterality: Left;  . COLONOSCOPY  11/30/2011   Procedure: COLONOSCOPY;  Surgeon: Lafayette Dragon, MD;  Location: [facility] ENDOSCOPY;  Service: Endoscopy;  Laterality: N/A;  . CORONARY ANGIOPLASTY WITH STENT PLACEMENT    . ESOPHAGOGASTRODUODENOSCOPY  11/30/2011   Procedure: ESOPHAGOGASTRODUODENOSCOPY (EGD);  Surgeon: Lafayette Dragon, MD;  Location: [facility];  Service: Endoscopy;  Laterality: N/A;  . ESOPHAGOGASTRODUODENOSCOPY N/A 02/23/2015   Procedure: ESOPHAGOGASTRODUODENOSCOPY (EGD);  Surgeon: Gatha Mayer, MD;  Location: [facility] ENDOSCOPY;  Service: Endoscopy;  Laterality: N/A;  . LEFT HEART CATHETERIZATION WITH CORONARY ANGIOGRAM N/A 02/20/2011   Procedure: LEFT HEART CATHETERIZATION WITH CORONARY ANGIOGRAM;  Surgeon: Clent Demark, MD;  Location: [place] CATH LAB;   Service: Cardiovascular;  Laterality: N/A;  . MASTECTOMY Right 11/05/2017  . MASTECTOMY W/ SENTINEL NODE BIOPSY Right 11/27/2017  . MASTECTOMY W/ SENTINEL NODE BIOPSY Right 11/27/2017   Procedure: RIGHT TOTAL MASTECTOMY WITH SENTINEL LYMPH NODE BIOPSY;  Surgeon: Excell Seltzer, MD;  Location: [place];  Service: General;  Laterality: Right;  . PORT A CATH REVISION Left 05/07/2018   Procedure: PORT A CATH REVISION;  Surgeon: Excell Seltzer, MD;  Location: [facility]S;  Service: General;  Laterality: Left;  . PORTACATH PLACEMENT Left 11/27/2017   Procedure: INSERTION PORT-A-CATH;  Surgeon: Excell Seltzer, MD;  Location: [place];  Service: General;  Laterality: Left;  . RIGHT COLECTOMY  02/2007   for post polypectomy colonic perforation  . TUBAL LIGATION      Social History:  reports that she quit smoking about 8 years ago. Her smoking use included cigarettes. She has a 23.50 pack-year smoking history. She has never used smokeless tobacco. She reports previous drug use. Drug: Cocaine. She reports that she does not drink alcohol.  Allergies: No Known Allergies  Prior to Admission medications   Medication Sig Start  Date End Date Taking? Authorizing Provider  acetaminophen (TYLENOL) 325 MG tablet Take 2 tablets (650 mg total) by mouth every 4 (four) hours as needed for headache or mild pain. 07/14/18   Charolette Forward, MD  albuterol (PROVENTIL) (2.5 MG/3ML) 0.083% nebulizer solution INHALE THE CONTENTS OF 1 VIAL USING NEBULIZER EVERY 6 HOURS AS NEEDEDFOR WHEEZING OR SHORTNESS OF BREATH Patient taking differently: Take 2.5 mg by nebulization every 6 (six) hours as needed for wheezing or shortness of breath.  08/07/18   Nicholas Lose, MD  allopurinol (ZYLOPRIM) 100 MG tablet Take 100 mg by mouth daily. 09/17/18   [provider]  ALPRAZolam Duanne Moron) 0.25 MG tablet Take 0.25 mg by mouth daily.     [provider]  amLODipine (NORVASC) 5 MG tablet Take 5 mg by mouth daily.    [provider]  anastrozole (ARIMIDEX) 1 MG tablet Take 1 tablet (1 mg total) by mouth daily. 02/27/18   Nicholas Lose, MD  aspirin EC 81 MG EC tablet Take 1 tablet (81 mg total) by mouth daily. 02/06/12   Patrecia Pour, NP  atorvastatin (LIPITOR) 20 MG tablet Take 20 mg by mouth daily.    [provider]  budesonide-formoterol (SYMBICORT) 160-4.5 MCG/ACT inhaler Inhale 1 puff into the lungs 2 (two) times daily.     [provider]  colchicine 0.6 MG tablet Take 0.6 mg by mouth daily as needed for pain. 07/17/18   [provider]  feeding supplement, ENSURE ENLIVE, (ENSURE ENLIVE) LIQD Take 237 mLs by mouth 2 (two) times daily between meals. 02/24/15   Ghimire, Henreitta Leber, MD  LANTUS SOLOSTAR 100 UNIT/ML Solostar Pen Inject 10 Units into the skin daily. 07/24/18   [provider]  loperamide (IMODIUM) 2 MG capsule Take 1 capsule (2 mg total) by mouth 4 (four) times daily as needed for diarrhea or loose stools. 02/27/15   Marella Chimes, PA-C  metoprolol succinate (TOPROL-XL) 50 MG 24 hr tablet Take 50 mg by mouth at bedtime. Take with or immediately following a meal.    [provider]  nitroGLYCERIN (NITROSTAT) 0.4 MG SL tablet Place 1 tablet (0.4 mg total) under the tongue every 5 (five) minutes as needed. For chest pain Patient taking differently: Place 0.4 mg under the tongue every 5 (five) minutes as needed for chest pain.  02/06/12   Patrecia Pour, NP  Omega-3 Fatty Acids (FISH OIL) 1000 MG CAPS Take 1,000 mg by mouth daily.    [provider]  ondansetron (ZOFRAN) 8 MG tablet Take 1 tablet (8 mg total) by mouth every 8 (eight) hours as needed for nausea or vomiting. 06/16/18   Nicholas Lose, MD  saccharomyces boulardii (FLORASTOR) 250 MG capsule Take 1 capsule (250 mg total) by mouth 2 (two) times daily for 15 days. 09/25/18 10/10/18  Florencia Reasons, MD  traMADol (ULTRAM) 50 MG tablet Take 50-100 mg by mouth every 6 (six) hours as needed for pain.  09/17/18   [provider]     Blood pressure 139/76, pulse 63, temperature 98.4 F (36.9 C), temperature source Oral, resp. rate 17, height 4' 11"  (1.499 m), weight 58.1 kg, SpO2 95 %. Physical Exam: General: Chronically ill-appearing elderly female writhing in bed HEENT: head is normocephalic, atraumatic.  Sclera are slightly icteric.  PERRL.  Some exophthalmos. Ears and nose without any masses or lesions.  Mouth is pink and moist Heart: regular, rate, and rhythm.  Palpable radial pulses bilaterally Chest wall: Port site visualized  and palpated Lungs: CTAB, no wheezes, rhonchi, or rales noted.  Respiratory effort nonlabored Abd: Protuberant and distended but soft.  She has tenderness in her epigastrium and right upper quadrant, slightly hypoactive bowel sounds MS: all 4 extremities are symmetrical with no cyanosis, clubbing, or edema. Skin: warm and dry with no masses, lesions, or rashes Psych: A&Ox3   Results for orders placed or performed during the hospital encounter of 10/02/18 (from the past 48 hour(s))  CBC     Status: Abnormal   Collection Time: 10/02/18 10:20 AM  Result Value Ref Range   WBC 6.8 4.0 - 10.5 K/uL   RBC 3.36 (L) 3.87 - 5.11 MIL/uL   Hemoglobin 10.6 (L) 12.0 - 15.0 g/dL   HCT 33.6 (L) 36.0 - 46.0 %   MCV 100.0 80.0 - 100.0 fL   MCH 31.5 26.0 - 34.0 pg   MCHC 31.5 30.0 - 36.0 g/dL   RDW 15.5 11.5 - 15.5 %   Platelets 363 150 - 400 K/uL   nRBC 0.3 (H) 0.0 - 0.2 %    Comment: Performed at [facility], [place] [address], [place], [place] [address]  Comprehensive metabolic panel     Status: Abnormal   Collection Time: 10/02/18 10:20 AM  Result Value Ref Range   Sodium 140 135 - 145 mmol/L   Potassium 3.4 (L) 3.5 - 5.1 mmol/L   Chloride 110 98 - 111 mmol/L   CO2 20 (L) 22 - 32 mmol/L   Glucose, Bld 115 (H) 70 - 99 mg/dL   BUN 15 8 - 23 mg/dL   Creatinine, Ser 1.37 (H) 0.44 - 1.00 mg/dL   Calcium 8.4 (L) 8.9 - 10.3 mg/dL   Total  Protein 7.1 6.5 - 8.1 g/dL   Albumin 3.2 (L) 3.5 - 5.0 g/dL   AST 280 (H) 15 - 41 U/L   ALT 267 (H) 0 - 44 U/L   Alkaline Phosphatase 879 (H) 38 - 126 U/L   Total Bilirubin 3.7 (H) 0.3 - 1.2 mg/dL   GFR calc non Af Amer 39 (L) >60 mL/min   GFR calc Af Amer 45 (L) >60 mL/min   Anion gap 10 5 - 15    Comment: Performed at [facility], [place] [address], [place], [place] [address]  Lipase, blood     Status: Abnormal   Collection Time: 10/02/18 10:20 AM  Result Value Ref Range   Lipase 82 (H) 11 - 51 U/L    Comment: Performed at [facility], [place] [address], [place], [place] [address]  Troponin I (High Sensitivity)     Status: None   Collection Time: 10/02/18 10:20 AM  Result Value Ref Range   Troponin I (High Sensitivity) 9 <18 ng/L    Comment: (NOTE) Elevated high sensitivity troponin I (hsTnI) values and significant  changes across serial measurements may suggest ACS but many other  chronic and acute conditions are known to elevate hsTnI results.  Refer to the "Links" section for chest pain algorithms and additional  guidance. Performed at [facility], [place] [address], [place], [place] [address]   Lactic acid, plasma     Status: None   Collection Time: 10/02/18 10:33 AM  Result Value Ref Range   Lactic Acid, Venous 0.6 0.5 - 1.9 mmol/L    Comment: Performed at [facility], [place] [address], [place], [place] [address]   US Abdomen Limited Ruq  Result Date: 10/02/2018 CLINICAL DATA:  Severe abdominal pain EXAM: ULTRASOUND ABDOMEN LIMITED RIGHT UPPER QUADRANT COMPARISON:  Ultrasound right upper quadrant September 19, 2018; CT abdomen and pelvis September 18 2018 and abdominal MRI September 18, 2018 FINDINGS: Gallbladder: There is a 1.7 cm calcification within the gallbladder consistent with a large gallstone. The gallbladder wall remains thickened edematous with localized fluid which may represent loculated perforation of  the gallbladder. Patient is focally tender over the gallbladder. Common bile duct: Diameter: 7 mm. There is slight intrahepatic biliary duct dilatation. Liver: No focal lesion identified. Within normal limits in parenchymal echogenicity. Portal vein is patent on color Doppler imaging with normal direction of blood flow towards the liver. Other: None. IMPRESSION: Findings in the gallbladder indicative of acute cholecystitis with questionable loculated area of perforation. Common bile duct upper normal in size. Slight degree of intrahepatic biliary duct dilatation felt to be present. Electronically Signed   By: Lowella Grip III M.D.   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  AFP tumor marker was normal.   - She was unable to have Perc Drain placed last admission as GB was to contracted -  It was felt that because of the inflammation seen on imaging that surgery would be high risk for injury to the CBD or bowel injury.  She was treated with IV antibiotics and her symptoms improved.  She was discharged home with plans for follow-up with Dr. Ninfa Linden in the office in 6-8 weeks to consider  interval cholecystectomy.  - Workup today with normal WBC, elevated LFTs and T bili, and RUQ ultrasound that shows 1.7 cm calcification within the gallbladder consistent with a large gallstone. There is also a with localized fluid which measures a loculated perforation of the gallbladder. Common bile duct is 7 mm also some slight intrahepatic biliary ductal dilatation. - EDP to contact GI about elevated LFT's and CBD. Her CA 19-9 was also elevated slightly during last admission. Appreciate their assistance.  -Would recommend repeating a CT scan to see if patient would now be amenable for Perc Chole drain.  Patient is still high risk for CBD or bowel injury with surgery.  Given the degree of inflammation on imaging, if Perc Chole drain was not able to be obtained, she would likely need a open gallbladder surgery and need cardiac clearance - Recommend medicine admission  - Leave NPO - Agree with IV abx   Addendum: CT with Choledocholithiasis. Await GI recs.   Alferd Apa, PA-C  [facility] Pager:  [phone]   10/02/2018 12:17 PM

## 2018-10-02 NOTE — ED Provider Notes (Signed)
[facility] Emergency Department Provider Note MRN:  [id number]  Arrival date & time: 10/02/18     Chief Complaint   Abdominal Pain   History of Present Illness   [name] is a 68 y.o. year-old female with a history of breast cancer presenting to the ED with chief complaint of abdominal pain.  Location: [facility] Duration: 6 hours Onset: Sudden Timing: Constant Description: Sharp Severity: Severe, 10 out of 10 Exacerbating/Alleviating Factors: None Associated Symptoms: Nausea Pertinent Negatives: Denies vomiting, no chest pain or shortness of breath, no lower abdominal pain, no fever.  Review of Systems  A complete 10 system review of systems was obtained and all systems are negative except as noted in the HPI and PMH.   Patient's Health History    Past Medical History:  Diagnosis Date  . Angina   . Arthritis    HANDS"  . Asthma   . Cancer ([place]) 09/04/2017   Right Breast  . Carpal tunnel syndrome, bilateral 11/26/2016  . Cholelithiasis   . Cocaine abuse ([place]) 07/2012   per E.R. drug screen  . Coronary artery disease   . Diabetes mellitus without mention of complication    type 2  . Dysphagia    esophageal dysmotility on 03/2011 esophagram   . Dyspnea    ongoing   . Fatty liver disease, nonalcoholic 6283   on Imaging.   Marland Kitchen GERD (gastroesophageal reflux disease)   . Headache   . History of colonic polyps 2009   adenomatous 2009, HP in 2009, 2011, 2013.   Marland Kitchen Hyperlipidemia   . Hypertension   . Iron deficiency anemia   . Myocardial infarction ([place])    years ago, 6 stents placed  . 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 ABDOMINAL ANGIOGRAM  02/20/2011   Procedure: ABDOMINAL ANGIOGRAM;  Surgeon: Clent Demark, MD;  Location: [facility] CATH LAB;  Service: Cardiovascular;;  . CARPAL TUNNEL RELEASE Right 07/04/2017   Procedure: RIGHT CARPAL TUNNEL RELEASE;  Surgeon: Leanora Cover, MD;  Location: [place];  Service: Orthopedics;  Laterality: Right;  . CARPAL TUNNEL RELEASE Left 09/12/2017   Procedure: LEFT CARPAL TUNNEL RELEASE;  Surgeon: Leanora Cover, MD;  Location: [place];  Service: Orthopedics;  Laterality: Left;  . COLONOSCOPY  11/30/2011   Procedure: COLONOSCOPY;  Surgeon: Lafayette Dragon, MD;  Location: [facility] ENDOSCOPY;  Service: Endoscopy;  Laterality: N/A;  . CORONARY ANGIOPLASTY WITH STENT PLACEMENT    . ESOPHAGOGASTRODUODENOSCOPY  11/30/2011   Procedure: ESOPHAGOGASTRODUODENOSCOPY (EGD);  Surgeon: Lafayette Dragon, MD;  Location: [facility];  Service: Endoscopy;  Laterality: N/A;  . ESOPHAGOGASTRODUODENOSCOPY N/A 02/23/2015   Procedure: ESOPHAGOGASTRODUODENOSCOPY (EGD);  Surgeon: Gatha Mayer, MD;  Location: [facility] ENDOSCOPY;  Service: Endoscopy;  Laterality: N/A;  . LEFT HEART CATHETERIZATION WITH CORONARY ANGIOGRAM N/A 02/20/2011   Procedure: LEFT HEART CATHETERIZATION WITH CORONARY ANGIOGRAM;  Surgeon: Clent Demark, MD;  Location: [place] CATH LAB;  Service: Cardiovascular;  Laterality: N/A;  . MASTECTOMY Right 11/05/2017  . MASTECTOMY W/ SENTINEL NODE BIOPSY Right 11/27/2017  . MASTECTOMY W/ SENTINEL NODE BIOPSY Right 11/27/2017   Procedure: RIGHT TOTAL MASTECTOMY WITH SENTINEL LYMPH NODE BIOPSY;  Surgeon: Excell Seltzer, MD;  Location: [place];  Service: General;  Laterality: Right;  . PORT A CATH REVISION Left 05/07/2018   Procedure: PORT A CATH REVISION;  Surgeon: Excell Seltzer, MD;  Location: [facility] ORS;  Service: General;  Laterality: Left;  . PORTACATH PLACEMENT Left 11/27/2017   Procedure: INSERTION PORT-A-CATH;  Surgeon: Excell Seltzer, MD;  Location: [place];  Service: General;  Laterality: Left;  . RIGHT COLECTOMY  02/2007   for post polypectomy colonic perforation  . TUBAL LIGATION      Family History  Problem Relation Age of Onset  . Heart disease Mother   . Diabetes Mother   . Cancer Father        unsure  what kind  . Diabetes Sister   . Colon cancer Neg Hx   . Breast cancer Neg Hx     Social History   Socioeconomic History  . Marital status: Divorced    Spouse name: Not on file  . Number of children: 2  . Years of education: Not on file  . Highest education level: Not on file  Occupational History  . Occupation: retired. prev worked in housekeeping w/ [employer]  . Financial resource strain: Not on file  . Food insecurity    Worry: Not on file    Inability: Not on file  . Transportation needs    Medical: Yes    Non-medical: Yes  Tobacco Use  . Smoking status: Former Smoker    Packs/day: 0.50    Years: 47.00    Pack years: 23.50    Types: Cigarettes    Quit date: 05/09/2010    Years since quitting: 8.4  . Smokeless tobacco: Never Used  Substance and Sexual Activity  . Alcohol use: No  . Drug use: Not Currently    Types: Cocaine    Comment: positive drug screen (cocaine, benzo's)07/2012 in emergency room  . Sexual activity: Not Currently    Birth control/protection: Post-menopausal  Lifestyle  . Physical activity    Days per week: Not on file    Minutes per session: Not on file  . Stress: Not on file  Relationships  . Social Herbalist on phone: Not on file    Gets together: Not on file    Attends religious service: Not on file    Active member of club or organization: Not on file    Attends meetings of clubs or organizations: Not on file    Relationship status: Not on file  . Intimate partner violence    Fear of current or ex partner: Not on file    Emotionally abused: Not on file    Physically abused: Not on file    Forced sexual activity: Not on file  Other Topics Concern  . Not on file  Social History Narrative   Caffeine use daily.     Physical Exam  Vital Signs and Nursing Notes reviewed Vitals:   10/02/18 1330 10/02/18 1432  BP: 113/78 (!) 148/69  Pulse: 60 64  Resp: 16 11  Temp:    SpO2: 96% 95%    CONSTITUTIONAL:  Well-appearing, in moderate distress due to pain NEURO:  Alert and oriented x 3, no focal deficits EYES:  eyes equal and reactive ENT/NECK:  no LAD, no JVD CARDIO: Regular rate, well-perfused, normal S1 and S2 PULM:  CTAB no wheezing or rhonchi GI/GU:  normal bowel sounds, non-distended, moderate right upper quadrant and epigastric tenderness to palpation MSK/SPINE:  No gross deformities, no edema SKIN:  no rash, atraumatic PSYCH:  Appropriate speech and behavior  Diagnostic and Interventional Summary    EKG Interpretation  Date/Time:  Thursday October 02 2018 10:43:06 EDT Ventricular Rate:  67 PR Interval:  QRS Duration: 98 QT Interval:  413 QTC Calculation: 436 R Axis:   7 Text Interpretation:  Sinus rhythm Left ventricular hypertrophy Anterior Q waves, possibly due to LVH Baseline wander in lead(s) V2 Partial missing lead(s): V2 Confirmed by Gerlene Fee [phone]) on 10/02/2018 11:39:33 AM      Labs Reviewed  CBC - Abnormal; Notable for the following components:      Result Value   RBC 3.36 (*)    Hemoglobin 10.6 (*)    HCT 33.6 (*)    nRBC 0.3 (*)    All other components within normal limits  COMPREHENSIVE METABOLIC PANEL - Abnormal; Notable for the following components:   Potassium 3.4 (*)    CO2 20 (*)    Glucose, Bld 115 (*)    Creatinine, Ser 1.37 (*)    Calcium 8.4 (*)    Albumin 3.2 (*)    AST 280 (*)    ALT 267 (*)    Alkaline Phosphatase 879 (*)    Total Bilirubin 3.7 (*)    GFR calc non Af Amer 39 (*)    GFR calc Af Amer 45 (*)    All other components within normal limits  LIPASE, BLOOD - Abnormal; Notable for the following components:   Lipase 82 (*)    All other components within normal limits  SARS CORONAVIRUS 2 (TAT 6-12 HRS)  LACTIC ACID, PLASMA  TROPONIN I (HIGH SENSITIVITY)  TROPONIN I (HIGH SENSITIVITY)    CT ABDOMEN PELVIS W CONTRAST  Final Result    US Abdomen Limited RUQ  Final Result      Medications  sodium chloride (PF) 0.9 %  injection (has no administration in time range)  HYDROmorphone (DILAUDID) injection 1 mg (1 mg Intravenous Given 10/02/18 1031)  cefTRIAXone (ROCEPHIN) 2 g in sodium chloride 0.9 % 100 mL IVPB (0 g Intravenous Stopped 10/02/18 1130)  metroNIDAZOLE (FLAGYL) IVPB 500 mg (0 mg Intravenous Stopped 10/02/18 1235)  sodium chloride 0.9 % bolus 500 mL (0 mLs Intravenous Stopped 10/02/18 1145)  iohexol (OMNIPAQUE) 300 MG/ML solution 100 mL (80 mLs Intravenous Contrast Given 10/02/18 1354)  HYDROmorphone (DILAUDID) injection 0.5 mg (0.5 mg Intravenous Given 10/02/18 1412)     Procedures Critical Care Critical Care Documentation Critical care time provided by me (excluding procedures): 33 minutes  Condition necessitating critical care: Concern for acute choledocholithiasis, concern for acute cholecystitis  Components of critical care management: reviewing of prior records, laboratory and imaging interpretation, frequent re-examination and reassessment of vital signs, administration of IV antibiotics, IV Dilaudid, discussion with consulting services.    ED Course and Medical Decision Making  I have reviewed the triage vital signs and the nursing notes.  Pertinent labs & imaging results that were available during my care of the patient were reviewed by me and considered in my medical decision making (see below for details).  Patient has known cholecystitis, also with question of perforated gallbladder, question of fistula between the gallbladder and the bowel.  She had a recent admission, was treated with antibiotics, is scheduled for outpatient cholecystectomy.  She arrives with acute return of pain.  She is afebrile with normal vital signs here.  She was discharged on Augmentin for 3 more days, will provide IV antibiotics while we await repeat ultrasound imaging.  Ultrasound revealing continued signs of cholecystitis.  Discussed with general surgery, who recommends repeat CT imaging to further evaluate  anatomy and see if she is a better candidate for IR intervention at this time.  Labs  are reassuring, normal lactate, normal white blood cell count.  Patient's pain is better controlled.  Patient to be admitted to hospitalist service, awaiting callback from gastroenterology given the concern for choledocholithiasis in the laboratory assessment.  Barth Kirks. Sedonia Small, [place] [email or web address]  Final Clinical Impressions(s) / ED Diagnoses     ICD-10-CM   1. Choledocholithiasis  K80.50   2. Cholecystitis  K81.9 US Abdomen Limited RUQ    US Abdomen Limited RUQ  3. Right upper quadrant abdominal pain  R10.11     ED Discharge Orders    None      Discharge Instructions Discussed with and Provided to Patient: Discharge Instructions   None       Maudie Flakes, MD 10/02/18 1500

## 2018-10-02 NOTE — ED Notes (Signed)
US at bedside

## 2018-10-02 NOTE — Plan of Care (Signed)
POC initiated 

## 2018-10-02 NOTE — Telephone Encounter (Signed)
Received VM from pt.  Attempt x1 to return call, no answer, LVM

## 2018-10-03 ENCOUNTER — Inpatient Hospital Stay: Payer: Medicare Other

## 2018-10-03 ENCOUNTER — Inpatient Hospital Stay (HOSPITAL_COMMUNITY): Payer: Medicare Other | Admitting: Certified Registered Nurse Anesthetist

## 2018-10-03 ENCOUNTER — Encounter (HOSPITAL_COMMUNITY)
Admission: EM | Disposition: A | Discharge status: Home / Self Care | Payer: Self-pay | Source: Home / Self Care | Attending: Internal Medicine

## 2018-10-03 ENCOUNTER — Encounter (HOSPITAL_COMMUNITY): Payer: Self-pay | Admitting: Certified Registered Nurse Anesthetist

## 2018-10-03 ENCOUNTER — Inpatient Hospital Stay (HOSPITAL_COMMUNITY): Payer: Medicare Other

## 2018-10-03 ENCOUNTER — Inpatient Hospital Stay: Payer: Medicare Other | Admitting: Hematology and Oncology

## 2018-10-03 DIAGNOSIS — R17 Unspecified jaundice: Secondary | ICD-10-CM

## 2018-10-03 DIAGNOSIS — R945 Abnormal results of liver function studies: Secondary | ICD-10-CM

## 2018-10-03 DIAGNOSIS — K838 Other specified diseases of biliary tract: Secondary | ICD-10-CM

## 2018-10-03 DIAGNOSIS — K8051 Calculus of bile duct without cholangitis or cholecystitis with obstruction: Secondary | ICD-10-CM

## 2018-10-03 DIAGNOSIS — R7989 Other specified abnormal findings of blood chemistry: Secondary | ICD-10-CM

## 2018-10-03 DIAGNOSIS — R1013 Epigastric pain: Secondary | ICD-10-CM

## 2018-10-03 HISTORY — PX: SPHINCTEROTOMY: SHX5544

## 2018-10-03 HISTORY — PX: ERCP: SHX5425

## 2018-10-03 LAB — PROTIME-INR
INR: 1 (ref 0.8–1.2)
Prothrombin Time: 13.2 seconds (ref 11.4–15.2)

## 2018-10-03 LAB — GLUCOSE, CAPILLARY
Glucose-Capillary: 82 mg/dL (ref 70–99)
Glucose-Capillary: 92 mg/dL (ref 70–99)
Glucose-Capillary: 104 mg/dL — ABNORMAL HIGH (ref 70–99)
Glucose-Capillary: 169 mg/dL — ABNORMAL HIGH (ref 70–99)

## 2018-10-03 LAB — COMPREHENSIVE METABOLIC PANEL
ALT: 242 U/L — ABNORMAL HIGH (ref 0–44)
AST: 186 U/L — ABNORMAL HIGH (ref 15–41)
Albumin: 3 g/dL — ABNORMAL LOW (ref 3.5–5.0)
Alkaline Phosphatase: 909 U/L — ABNORMAL HIGH (ref 38–126)
Anion gap: 10 (ref 5–15)
BUN: 24 mg/dL — ABNORMAL HIGH (ref 8–23)
CO2: 23 mmol/L (ref 22–32)
Calcium: 8.5 mg/dL — ABNORMAL LOW (ref 8.9–10.3)
Chloride: 106 mmol/L (ref 98–111)
Creatinine, Ser: 1.72 mg/dL — ABNORMAL HIGH (ref 0.44–1.00)
GFR calc Af Amer: 34 mL/min — ABNORMAL LOW (ref >60)
GFR calc non Af Amer: 30 mL/min — ABNORMAL LOW (ref >60)
Glucose, Bld: 118 mg/dL — ABNORMAL HIGH (ref 70–99)
Potassium: 3.8 mmol/L (ref 3.5–5.1)
Sodium: 139 mmol/L (ref 135–145)
Total Bilirubin: 3.2 mg/dL — ABNORMAL HIGH (ref 0.3–1.2)
Total Protein: 7.4 g/dL (ref 6.5–8.1)

## 2018-10-03 LAB — APTT: aPTT: 32 seconds (ref 24–36)

## 2018-10-03 SURGERY — ERCP, WITH INTERVENTION IF INDICATED
Anesthesia: General

## 2018-10-03 MED ORDER — FENTANYL CITRATE (PF) 100 MCG/2ML IJ SOLN
INTRAMUSCULAR | Status: AC
  Filled 2018-10-03: qty 2

## 2018-10-03 MED ORDER — GLUCAGON HCL RDNA (DIAGNOSTIC) 1 MG IJ SOLR
INTRAMUSCULAR | Status: DC | PRN
  Administered 2018-10-03 (×3): 0.25 mg via INTRAVENOUS

## 2018-10-03 MED ORDER — GLUCAGON HCL RDNA (DIAGNOSTIC) 1 MG IJ SOLR
INTRAMUSCULAR | Status: AC
  Filled 2018-10-03: qty 1

## 2018-10-03 MED ORDER — PROPOFOL 10 MG/ML IV BOLUS
INTRAVENOUS | Status: DC | PRN
  Administered 2018-10-03: 120 mg via INTRAVENOUS

## 2018-10-03 MED ORDER — SODIUM CHLORIDE 0.9 % IV SOLN
INTRAVENOUS | Status: DC | PRN
  Administered 2018-10-03: 15:00:00 30 mL

## 2018-10-03 MED ORDER — PROPOFOL 10 MG/ML IV BOLUS
INTRAVENOUS | Status: AC
  Filled 2018-10-03: qty 20

## 2018-10-03 MED ORDER — INDOMETHACIN 50 MG RE SUPP: 100.0000 mg | Freq: Once | RECTAL | Status: DC

## 2018-10-03 MED ORDER — INDOMETHACIN 50 MG RE SUPP
RECTAL | Status: DC | PRN
  Administered 2018-10-03: 100 mg via RECTAL

## 2018-10-03 MED ORDER — METOPROLOL SUCCINATE ER 25 MG PO TB24
25.0000 mg | ORAL_TABLET | Freq: Every day | ORAL | Status: DC
  Administered 2018-10-04 – 2018-10-07 (×4): 25 mg via ORAL
  Filled 2018-10-03 (×4): qty 1

## 2018-10-03 MED ORDER — PHENYLEPHRINE 40 MCG/ML (10ML) SYRINGE FOR IV PUSH (FOR BLOOD PRESSURE SUPPORT)
PREFILLED_SYRINGE | INTRAVENOUS | Status: DC | PRN
  Administered 2018-10-03: 120 ug via INTRAVENOUS

## 2018-10-03 MED ORDER — SUGAMMADEX SODIUM 200 MG/2ML IV SOLN
INTRAVENOUS | Status: DC | PRN
  Administered 2018-10-03: 200 mg via INTRAVENOUS

## 2018-10-03 MED ORDER — INDOMETHACIN 50 MG RE SUPP
RECTAL | Status: AC
  Filled 2018-10-03: qty 2

## 2018-10-03 MED ORDER — ENOXAPARIN SODIUM 30 MG/0.3ML ~~LOC~~ SOLN
30.0000 mg | SUBCUTANEOUS | Status: DC
  Administered 2018-10-03 – 2018-10-04 (×2): 30 mg via SUBCUTANEOUS
  Filled 2018-10-03 (×2): qty 0.3

## 2018-10-03 MED ORDER — SUCCINYLCHOLINE CHLORIDE 200 MG/10ML IV SOSY
PREFILLED_SYRINGE | INTRAVENOUS | Status: DC | PRN
  Administered 2018-10-03: 100 mg via INTRAVENOUS

## 2018-10-03 MED ORDER — ONDANSETRON HCL 4 MG/2ML IJ SOLN
INTRAMUSCULAR | Status: DC | PRN
  Administered 2018-10-03: 4 mg via INTRAVENOUS

## 2018-10-03 MED ORDER — LIDOCAINE 2% (20 MG/ML) 5 ML SYRINGE
INTRAMUSCULAR | Status: DC | PRN
  Administered 2018-10-03: 80 mg via INTRAVENOUS

## 2018-10-03 MED ORDER — ROCURONIUM BROMIDE 10 MG/ML (PF) SYRINGE
PREFILLED_SYRINGE | INTRAVENOUS | Status: DC | PRN
  Administered 2018-10-03: 10 mg via INTRAVENOUS
  Administered 2018-10-03: 30 mg via INTRAVENOUS

## 2018-10-03 NOTE — Anesthesia Procedure Notes (Signed)
Procedure Name: Intubation Date/Time: 10/03/2018 2:22 PM Performed by: [place] Lanice Folden C, CRNA Pre-anesthesia Checklist: Patient identified, Emergency Drugs available, Suction available and Patient being monitored Patient Re-evaluated:Patient Re-evaluated prior to induction Oxygen Delivery Method: Circle system utilized Preoxygenation: Pre-oxygenation with 100% oxygen Induction Type: IV induction Ventilation: Mask ventilation without difficulty Laryngoscope Size: Mac and 3 Grade View: Grade I Tube type: Oral Tube size: 7.0 mm Number of attempts: 1 Airway Equipment and Method: Stylet and Oral airway Placement Confirmation: ETT inserted through vocal cords under direct vision,  positive ETCO2 and breath sounds checked- equal and bilateral Secured at: 21 cm Tube secured with: Tape Dental Injury: Teeth and Oropharynx as per pre-operative assessment

## 2018-10-03 NOTE — Op Note (Signed)
[facility] Patient Name: [name] Procedure Date: 10/03/2018 MRN: [id number] Attending MD: Ladene Artist , MD Date of Birth: 1950/07/22 CSN: [id number] Age: 68 Admit Type: Inpatient Procedure:                ERCP Indications:              Abdominal pain of suspected biliary origin, Biliary                            dilation on CT, Jaundice, Elevated liver enzymes,                            CBD filling defect on CT Providers:                Pricilla Riffle. Fuller Plan, MD, Cleda Daub, RN, Ladona Ridgel, Technician Referring MD:             [facility] Medicines:                General Anesthesia Complications:            No immediate complications. Estimated Blood Loss:     Estimated blood loss: none. Procedure:                Pre-Anesthesia Assessment:                           - Prior to the procedure, a History and Physical                            was performed, and patient medications and                            allergies were reviewed. The patient's tolerance of                            previous anesthesia was also reviewed. The risks                            and benefits of the procedure and the sedation                            options and risks were discussed with the patient.                            All questions were answered, and informed consent                            was obtained. Prior Anticoagulants: The patient has                            taken no previous anticoagulant or antiplatelet  agents. ASA Grade Assessment: III - A patient with                            severe systemic disease. After reviewing the risks                            and benefits, the patient was deemed in                            satisfactory condition to undergo the procedure.                           After obtaining informed consent, the scope was                            passed under  direct vision. Throughout the                            procedure, the patient's blood pressure, pulse, and                            oxygen saturations were monitored continuously. The                            [id number]-Q180V ([id number] Olympus duodenoscope was                            introduced through the mouth, and used to inject                            contrast into and used to inject contrast into the                            bile duct. The ERCP was accomplished without                            difficulty. The patient tolerated the procedure                            well. Scope In: Scope Out: Findings:      The scout film was normal. The esophagus was successfully intubated       under direct vision. The scope was advanced to the major papilla in the       descending duodenum without detailed examination of the pharynx, larynx       and associated structures, and upper GI tract. The upper GI tract was       grossly normal except for a slightly buldging major ampulla with clot or       sludge at orifice. A straight Roadrunner wire was passed into the       biliary tree. The short-nosed traction sphincterotome was passed over       the guidewire and the bile duct was then deeply cannulated. Contrast was       injected. I personally interpreted the bile duct images. Ductal flow of  contrast was adequate. The common bile duct was diffusely dilated, with       sludge and clots causing an obstruction. The largest diameter was 12 mm.       The intrahepatic ducts were mild dilated. The main bile duct contained       several large filling defects thought to be clots and sludge. A 9 mm       biliary sphincterotomy was made with a traction (standard)       sphincterotome using ERBE electrocautery. There was no       post-sphincterotomy bleeding. The biliary tree was swept multiple times       with a 12 mm balloon starting at the bifurcation. Sludge was swept from       the duct.  Large clots were swept from the duct. No residual filling       defects noted on final occlusion cholangiogram. Air bubbles refluxed       into the biliary tree. No clear filling of GB or cystic duct was noted.       The distal PD was entered briefly with the guidewire. PD was not       injected or cannulated with the catheter by intention. Impression:               - Several large CBD filling defects consistent with                            clots and sludge noted on cholangiogram.                           - The common bile duct was dilated, with clots and                            sludge causing an obstruction.                           - The intrahepatic ducts were mildly dilated.                           - Complete removal was accomplished by biliary                            sphincterotomy and balloon extraction.                           - A biliary sphincterotomy was performed.                           - The biliary tree was swept and clots and sludge                            were found. Moderate Sedation:      Not Applicable - Patient had care per Anesthesia. Recommendation:           - Avoid aspirin and nonsteroidal anti-inflammatory                            medicines for 1 week following Indocin today.                           -  Observe patient's clinical course following                            today's ERCP with therapeutic intervention.                           - Trend LFTs.                           - Continue IV Zosyn. Procedure Code(s):        --- Professional ---                           [phone], Endoscopic retrograde                            cholangiopancreatography (ERCP); with removal of                            calculi/debris from biliary/pancreatic duct(s)                           43262, Endoscopic retrograde                            cholangiopancreatography (ERCP); with                            sphincterotomy/papillotomy Diagnosis Code(s):         --- Professional ---                           K80.51, Calculus of bile duct without cholangitis                            or cholecystitis with obstruction                           R10.9, Unspecified abdominal pain                           R17, Unspecified jaundice                           R74.8, Abnormal levels of other serum enzymes                           K83.8, Other specified diseases of biliary tract                           R93.2, Abnormal findings on diagnostic imaging of                            liver and biliary tract CPT copyright 2019 American Medical Association. All rights reserved. The codes documented in this report are preliminary and upon coder review may  be revised to meet current compliance requirements. Ladene Artist, MD 10/03/2018 3:30:29 PM This report has been signed electronically. Number of Addenda: 0

## 2018-10-03 NOTE — Anesthesia Preprocedure Evaluation (Signed)
Anesthesia Evaluation    Airway Mallampati: I       Dental no notable dental hx. (+) Teeth Intact   Pulmonary asthma , former smoker,    Pulmonary exam normal breath sounds clear to auscultation       Cardiovascular hypertension, + CAD and + Past MI  Normal cardiovascular exam Rhythm:Regular Rate:Normal     Neuro/Psych  Headaches, PSYCHIATRIC DISORDERS Depression    GI/Hepatic   Endo/Other  diabetes, Type 2, Insulin Dependent  Renal/GU Renal InsufficiencyRenal disease     Musculoskeletal   Abdominal Normal abdominal exam  (+)   Peds  Hematology  (+) anemia ,   Anesthesia Other Findings Result status: Final result    ECHOCARDIOGRAM REPORT       Patient Name:   [name] Date of Exam: 09/30/2018 Medical Rec #:  [id number]            Height:       59.0 in Accession #:    [id number]           Weight:       129.2 lb Date of Birth:  1950-08-22            BSA:          1.53 m Patient Age:    68 years             BP:           134/70 mmHg Patient Gender: F                    HR:           61 bpm. Exam Location:  Outpatient    Procedure: 2D Echo, Cardiac Doppler and Color Doppler  Indications:    Chemo evaluation   History:        Patient has prior history of Echocardiogram examinations, most                 recent 07/10/2018. CAD and Previous Myocardial Infarction Risk                 Factors: Hypertension, Dyslipidemia and Diabetes. Breast cancer,                 chemo.   Sonographer:    Dustin Flock Referring Phys: [id number] DANIEL R BENSIMHON  IMPRESSIONS    1. The left ventricle has normal systolic function, with an ejection fraction of 55-60%. The cavity size was normal. Left ventricular diastolic Doppler parameters are consistent with pseudonormalization.  2. The right ventricle has normal systolic function. The cavity was normal. There is no increase in right ventricular wall thickness.  3. Left atrial size was mildly dilated.  4. Mild calcification of the mitral valve leaflet.  5. The aortic valve is tricuspid. Mild calcification of the aortic valve. Aortic valve regurgitation is moderate by color flow Doppler.  6. The aorta is normal unless otherwise noted.  FINDINGS  Left Ventricle: The left ventricle has normal systolic     Reproductive/Obstetrics                             Anesthesia Physical Anesthesia Plan  ASA: II  Anesthesia Plan: General   Post-op Pain Management:    Induction: Intravenous  PONV Risk Score and Plan:   Airway Management Planned: Oral ETT  Additional Equipment: None  Intra-op Plan:   Post-operative Plan:   Informed Consent:  I have reviewed the patients History and Physical, chart, labs and discussed the procedure including the risks, benefits and alternatives for the proposed anesthesia with the patient or authorized representative who has indicated his/her understanding and acceptance.     Dental advisory given  Plan Discussed with: CRNA  Anesthesia Plan Comments:         Anesthesia Quick Evaluation

## 2018-10-03 NOTE — Plan of Care (Signed)

## 2018-10-03 NOTE — Consult Note (Addendum)
Consultation  Referring Provider: TRH/ Rai Primary Care Physician:  Tsosie Billing, MD (Inactive) Primary Gastroenterologist:  Dr.Gessner  Reason for Consultation: Recurrent epigastric pain, jaundice and dilated CBD  HPI: [name] is a 68 y.o. female, who was initially admitted 8/13 through 09/25/2018 with acute cholecystitis.  Imaging at that time with MRCP was negative for choledocholithiasis but did show a 2.8 cm cystic lesion along the gallbladder wall favoring localized perforation There was also adherent proximal duodenum with mild wall thickening suggesting involvement with the right upper quadrant inflammatory process there was no fistulous communication demonstrated. Patient , was treated with IV antibiotics, had IR consult , but decision made not to do Perc drainage of GB. She improved with ABXand discharged to home with plans to follow-up with Dr. Ninfa Linden, and have cholecystectomy in 6 to 8 weeks.  She presented back to the emergency room last night with recurrent epigastric pain nausea and vomiting. Upper abdominal ultrasound shows a 1.7 cm gallstone in the gallbladder, edematous gallbladder with localized question loculated perforation of gallbladder, CBD 7 mm  CT of the abdomen pelvis yesterday showed gallstones with gallbladder wall thickening consistent with acute cholecystitis.  Now with CBD of 14 mm and dense material in the CBD felt consistent with sludge versus stones.  On admit WBC 6.8, hemoglobin 10.6, creatinine 1.3 T bili 3.7/alk phos 879/AST 280/ALT 267 and lipase 82.  She was started on IV Zosyn. She feels about the same today, still having pain and feels nauseated.  Low-grade temp in the 99 4 range  Other medical problems include coronary artery disease status post prior MI and stenting, only maintained on aspirin.  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 Is also status post right colectomy for a post polypectomy perforation in 2009.   Past Medical History:  Diagnosis Date  . Angina   . Arthritis    HANDS"  . Asthma   . Cancer ([place]) 09/04/2017   Right Breast  . Carpal tunnel syndrome, bilateral 11/26/2016  . Cholelithiasis   . Cocaine abuse ([place]) 07/2012   per E.R. drug screen  . Coronary artery disease   . Diabetes mellitus without mention of complication    type 2  . Dysphagia    esophageal dysmotility on 03/2011 esophagram   . Dyspnea    ongoing   . Fatty liver disease, nonalcoholic 2426   on Imaging.   Marland Kitchen GERD (gastroesophageal reflux disease)   . Headache   . History of colonic polyps 2009   adenomatous 2009, HP in 2009, 2011, 2013.   Marland Kitchen Hyperlipidemia   . Hypertension   . Iron deficiency anemia   . Myocardial infarction ([place])    years ago, 6 stents placed  . 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 ABDOMINAL ANGIOGRAM  02/20/2011   Procedure: ABDOMINAL ANGIOGRAM;  Surgeon: Clent Demark, MD;  Location: [facility] CATH LAB;  Service: Cardiovascular;;  . CARPAL TUNNEL RELEASE Right 07/04/2017   Procedure: RIGHT CARPAL TUNNEL RELEASE;  Surgeon: Leanora Cover, MD;  Location: [place];  Service: Orthopedics;  Laterality: Right;  . CARPAL TUNNEL RELEASE Left 09/12/2017   Procedure: LEFT CARPAL TUNNEL RELEASE;  Surgeon: Leanora Cover, MD;  Location: [place];  Service: Orthopedics;  Laterality: Left;  . COLONOSCOPY  11/30/2011   Procedure: COLONOSCOPY;  Surgeon: Sydell Axon  Andris Baumann, MD;  Location: [facility]OS[facility];  Service: Endoscopy;  Laterality: N/A;  . CORONARY ANGIOPLASTY WITH STENT PLACEMENT    . ESOPHAGOGASTRODUODENOSCOPY  11/30/2011   Procedure: ESOPHAGOGASTRODUODENOSCOPY (EGD);  Surgeon: Lafayette Dragon, MD;  Location: [facility]OS[facility];  Service: Endoscopy;  Laterality: N/A;  . ESOPHAGOGASTRODUODENOSCOPY  N/A 02/23/2015   Procedure: ESOPHAGOGASTRODUODENOSCOPY (EGD);  Surgeon: Gatha Mayer, MD;  Location: [facility] ENDOSCOPY;  Service: Endoscopy;  Laterality: N/A;  . LEFT HEART CATHETERIZATION WITH CORONARY ANGIOGRAM N/A 02/20/2011   Procedure: LEFT HEART CATHETERIZATION WITH CORONARY ANGIOGRAM;  Surgeon: Clent Demark, MD;  Location: [place] CATH LAB;  Service: Cardiovascular;  Laterality: N/A;  . MASTECTOMY Right 11/05/2017  . MASTECTOMY W/ SENTINEL NODE BIOPSY Right 11/27/2017  . MASTECTOMY W/ SENTINEL NODE BIOPSY Right 11/27/2017   Procedure: RIGHT TOTAL MASTECTOMY WITH SENTINEL LYMPH NODE BIOPSY;  Surgeon: Excell Seltzer, MD;  Location: [place];  Service: General;  Laterality: Right;  . PORT A CATH REVISION Left 05/07/2018   Procedure: PORT A CATH REVISION;  Surgeon: Excell Seltzer, MD;  Location: [facility] ORS;  Service: General;  Laterality: Left;  . PORTACATH PLACEMENT Left 11/27/2017   Procedure: INSERTION PORT-A-CATH;  Surgeon: Excell Seltzer, MD;  Location: [place];  Service: General;  Laterality: Left;  . RIGHT COLECTOMY  02/2007   for post polypectomy colonic perforation  . TUBAL LIGATION      Prior to Admission medications   Medication Sig Start Date End Date Taking? Authorizing Provider  acetaminophen (TYLENOL) 325 MG tablet Take 2 tablets (650 mg total) by mouth every 4 (four) hours as needed for headache or mild pain. 07/14/18  Yes Charolette Forward, MD  albuterol (PROVENTIL) (2.5 MG/3ML) 0.083% nebulizer solution INHALE THE CONTENTS OF 1 VIAL USING NEBULIZER EVERY 6 HOURS AS NEEDEDFOR WHEEZING OR SHORTNESS OF BREATH Patient taking differently: Take 2.5 mg by nebulization every 6 (six) hours as needed for wheezing or shortness of breath.  08/07/18  Yes Nicholas Lose, MD  allopurinol (ZYLOPRIM) 100 MG tablet Take 100 mg by mouth daily. 09/17/18  Yes [provider]  ALPRAZolam Duanne Moron) 0.25 MG tablet Take 0.25 mg by mouth at bedtime.    Yes [provider]  amLODipine (NORVASC) 5  MG tablet Take 5 mg by mouth daily.   Yes [provider]  anastrozole (ARIMIDEX) 1 MG tablet Take 1 tablet (1 mg total) by mouth daily. 02/27/18  Yes Nicholas Lose, MD  aspirin EC 81 MG EC tablet Take 1 tablet (81 mg total) by mouth daily. 02/06/12  Yes Patrecia Pour, NP  atorvastatin (LIPITOR) 20 MG tablet Take 20 mg by mouth daily at 6 PM.    Yes [provider]  budesonide-formoterol (SYMBICORT) 160-4.5 MCG/ACT inhaler Inhale 1 puff into the lungs 2 (two) times daily.    Yes [provider]  colchicine 0.6 MG tablet Take 0.6 mg by mouth daily as needed for pain. 07/17/18  Yes [provider]  feeding supplement, ENSURE ENLIVE, (ENSURE ENLIVE) LIQD Take 237 mLs by mouth 2 (two) times daily between meals. 02/24/15  Yes Ghimire, Henreitta Leber, MD  LANTUS SOLOSTAR 100 UNIT/ML Solostar Pen Inject 10 Units into the skin daily. 07/24/18  Yes [provider]  loperamide (IMODIUM) 2 MG capsule Take 1 capsule (2 mg total) by mouth 4 (four) times daily as needed for diarrhea or loose stools. 02/27/15  Yes Marella Chimes, PA-C  metoprolol succinate (TOPROL-XL) 50 MG 24 hr tablet Take 50 mg by mouth daily. Take with or  immediately following a meal.   Yes [provider]  nitroGLYCERIN (NITROSTAT) 0.4 MG SL tablet Place 1 tablet (0.4 mg total) under the tongue every 5 (five) minutes as needed. For chest pain Patient taking differently: Place 0.4 mg under the tongue every 5 (five) minutes as needed for chest pain.  02/06/12  Yes Patrecia Pour, NP  Omega-3 Fatty Acids (FISH OIL) 1000 MG CAPS Take 1,000 mg by mouth daily.   Yes [provider]  ondansetron (ZOFRAN) 8 MG tablet Take 1 tablet (8 mg total) by mouth every 8 (eight) hours as needed for nausea or vomiting. 06/16/18  Yes Nicholas Lose, MD  saccharomyces boulardii (FLORASTOR) 250 MG capsule Take 1 capsule (250 mg total) by mouth 2 (two) times daily for 15 days. 09/25/18 10/10/18 Yes Florencia Reasons, MD   traMADol (ULTRAM) 50 MG tablet Take 50-100 mg by mouth every 6 (six) hours as needed for pain. 09/17/18  Yes [provider]    Current Facility-Administered Medications  Medication Dose Route Frequency Provider Last Rate Last Dose  . 0.9 %  sodium chloride infusion   Intravenous Continuous Hosie Poisson, MD 75 mL/hr at 10/02/18 2015    . acetaminophen (TYLENOL) tablet 650 mg  650 mg Oral Q4H PRN Hosie Poisson, MD      . albuterol (PROVENTIL) (2.5 MG/3ML) 0.083% nebulizer solution 2.5 mg  2.5 mg Nebulization Q6H PRN Hosie Poisson, MD      . ALPRAZolam Duanne Moron) tablet 0.25 mg  0.25 mg Oral QHS Hosie Poisson, MD   0.25 mg at 10/02/18 2239  . amLODipine (NORVASC) tablet 5 mg  5 mg Oral Daily Hosie Poisson, MD   5 mg at 10/03/18 1229  . atorvastatin (LIPITOR) tablet 20 mg  20 mg Oral q1800 Hosie Poisson, MD      . enoxaparin (LOVENOX) injection 30 mg  30 mg Subcutaneous Q24H Hosie Poisson, MD   30 mg at 10/02/18 2239  . hydrALAZINE (APRESOLINE) injection 5 mg  5 mg Intravenous Q6H PRN Hosie Poisson, MD      . HYDROmorphone (DILAUDID) injection 0.5 mg  0.5 mg Intravenous Q4H PRN Hosie Poisson, MD   0.5 mg at 10/03/18 0716  . insulin aspart (novoLOG) injection 0-5 Units  0-5 Units Subcutaneous QHS Hosie Poisson, MD      . insulin aspart (novoLOG) injection 0-9 Units  0-9 Units Subcutaneous TID WC Hosie Poisson, MD      . metoprolol succinate (TOPROL-XL) 24 hr tablet 50 mg  50 mg Oral Daily Hosie Poisson, MD   50 mg at 10/03/18 1230  . mometasone-formoterol (DULERA) 200-5 MCG/ACT inhaler 2 puff  2 puff Inhalation BID Hosie Poisson, MD   2 puff at 10/03/18 0812  . nitroGLYCERIN (NITROSTAT) SL tablet 0.4 mg  0.4 mg Sublingual Q5 min PRN Hosie Poisson, MD      . ondansetron (ZOFRAN) injection 4 mg  4 mg Intravenous Q6H PRN Hosie Poisson, MD      . piperacillin-tazobactam (ZOSYN) IVPB 3.375 g  3.375 g Intravenous Q8H Thomes Lolling, RPH 12.5 mL/hr at 10/03/18 1232 3.375 g at 10/03/18 1232     Allergies as of 10/02/2018  . (No Known Allergies)    Family History  Problem Relation Age of Onset  . Heart disease Mother   . Diabetes Mother   . Cancer Father        unsure what kind  . Diabetes Sister   . Colon cancer Neg Hx   . Breast cancer Neg  Hx     Social History   Socioeconomic History  . Marital status: Divorced    Spouse name: Not on file  . Number of children: 2  . Years of education: Not on file  . Highest education level: Not on file  Occupational History  . Occupation: retired. prev worked in housekeeping w/ [employer]  . Financial resource strain: Not on file  . Food insecurity    Worry: Not on file    Inability: Not on file  . Transportation needs    Medical: Yes    Non-medical: Yes  Tobacco Use  . Smoking status: Former Smoker    Packs/day: 0.50    Years: 47.00    Pack years: 23.50    Types: Cigarettes    Quit date: 05/09/2010    Years since quitting: 8.4  . Smokeless tobacco: Never Used  Substance and Sexual Activity  . Alcohol use: No  . Drug use: Not Currently    Types: Cocaine    Comment: positive drug screen (cocaine, benzo's)07/2012 in emergency room  . Sexual activity: Not Currently    Birth control/protection: Post-menopausal  Lifestyle  . Physical activity    Days per week: Not on file    Minutes per session: Not on file  . Stress: Not on file  Relationships  . Social Herbalist on phone: Not on file    Gets together: Not on file    Attends religious service: Not on file    Active member of club or organization: Not on file    Attends meetings of clubs or organizations: Not on file    Relationship status: Not on file  . Intimate partner violence    Fear of current or ex partner: Not on file    Emotionally abused: Not on file    Physically abused: Not on file    Forced sexual activity: Not on file  Other Topics Concern  . Not on file  Social History Narrative   Caffeine use daily.    Review of  Systems: Pertinent positive and negative review of systems were noted in the above HPI section.  All other review of systems was otherwise negative.  Physical Exam: Vital signs in last 24 hours: Temp:  [98.7 F (37.1 C)-99.4 F (37.4 C)] 99.4 F (37.4 C) (08/28 0422) Pulse Rate:  [54-76] 71 (08/28 0422) Resp:  [11-20] 16 (08/28 0422) BP: (113-167)/(69-90) 141/73 (08/28 0422) SpO2:  [93 %-99 %] 95 % (08/28 0813) Weight:  [56.7 kg] 56.7 kg (08/27 1859) Last BM Date: 10/01/18 General:   Alert,  Well-developed, well-nourished, older African-American female pleasant and cooperative in NAD.  Jaundiced Head:  Normocephalic and atraumatic. Eyes:  Sclera with early icterus conjunctiva pink. Ears:  Normal auditory acuity. Nose:  No deformity, discharge,  or lesions. Mouth:  No deformity or lesions.   Neck:  Supple; no masses or thyromegaly. Lungs:  Clear throughout to auscultation.   No wheezes, crackles, or rhonchi.   Heart:  Regular rate and rhythm; no murmurs, clicks, rubs,  or gallops. Abdomen:  Soft,tender epigastrium and right upper quadrant, no rebound, BS active,nonpalp mass or hsm.  Midline incisional scar Rectal:  Deferred  Msk:  Symmetrical without gross deformities. . Pulses:  Normal pulses noted. Extremities:  Without clubbing or edema. Neurologic:  Alert and  oriented x4;  grossly normal neurologically. Skin:  Intact without significant lesions or rashes.. Psych:  Alert and cooperative. Normal mood and affect.  Intake/Output  from previous day: 08/27 0701 - 08/28 0700 In: 526.4 [I.V.:426.4; IV Piggyback:100] Out: 200 [Urine:200] Intake/Output this shift: No intake/output data recorded.  Lab Results: Recent Labs    10/02/18 1020  WBC 6.8  HGB 10.6*  HCT 33.6*  PLT 363   BMET Recent Labs    10/02/18 1020  NA 140  K 3.4*  CL 110  CO2 20*  GLUCOSE 115*  BUN 15  CREATININE 1.37*  CALCIUM 8.4*   LFT Recent Labs    10/02/18 1020  PROT 7.1  ALBUMIN 3.2*   AST 280*  ALT 267*  ALKPHOS 879*  BILITOT 3.7*   PT/INR No results for input(s): LABPROT, INR in the last 72 hours. Hepatitis Panel No results for input(s): HEPBSAG, HCVAB, HEPAIGM, HEPBIGM in the last 72 hours.    IMPRESSION:  #44 68 year old African-American female admitted yesterday with acute epigastric pain nausea and vomiting.  She was found to be jaundiced and has transaminitis. Imaging with upper abdominal ultrasound and CT of the abdomen and pelvis show cholelithiasis, with edematous gallbladder wall consistent with cholecystitis and small common bile duct and intrahepatic ductal dilation, with density within the CBD consistent with stones versus sludge.  #2 Recent admission 2 weeks ago with acute cholecystitis and localized perforation of the gallbladder. She also had some duodenal inflammation with the proximal duodenum adherent to the gallbladder.  She was not felt to be a good candidate for percutaneous drainage of the gallbladder, and fortunately improved with antibiotics and was discharged with plans to have cholecystectomy in several weeks.  #2 coronary artery disease status post prior MI and remote stents-on no blood thinners, just aspirin #3.  History of CVA #4.  Insulin-dependent diabetes mellitus #5.  Chronic kidney disease #6.  Status post remote right colectomy #7.  Right breast DCIS-on chemotherapy with Herceptin.   Plan: Keep n.p.o. Patient has been scheduled for ERCP with probable sphincterotomy and stone extraction with Dr. Fuller Plan this afternoon.  Procedure has been discussed in detail with the patient and also her sister(per phone) including indications risks and benefits and patient is agreeable to proceed.  Continue Zosyn Hold Lovenox tonight Repeat labs in a.m. Plans regarding timing of cholecystectomy per surgery.  Amy Esterwood PA-C 10/03/2018, 12:40 PM     Attending Physician Note   I have taken a history, examined the patient and reviewed  the chart. I agree with the Advanced Practitioner's note, impression and recommendations.   Epigastric pain, N/V, elevated LFTs, jaundice and CT AP yesterday showing cholelithiasis, GB wall thickening, intrahepatic and extrahepatic biliary dilation to 14 mm and a density within CBD - stone vs sludge. Interesting RUQ [place] yesterday showed 7 mm CBD. Recent hospitalization for acute cholecystitis, cholelithiasis, a localized gallbladder perf - not felt to be a good candidate for a perc GB drain so treated with IV antibiotics.  * ERCP today for choledocholithiasis with obstruction. The risks (including pancreatitis, bleeding, perforation, infection, missed lesions, medication reactions and possible hospitalization or surgery if complications occur), benefits, and alternatives to ERCP with possible sphincterotomy and possible stent were discussed with the patient and they consent to proceed.  * Cholecystitis, cholelithiasis mgmt per general surgery. IV Zosyn for now.  Lucio Edward, MD [facility]

## 2018-10-03 NOTE — Progress Notes (Signed)
[facility]                                                                              Patient Demographics  [name], is a 68 y.o. female, DOB - 1950/10/25, LPF:[id number]  Admit date - 10/02/2018   Admitting Physician Hosie Poisson, MD  Outpatient Primary MD for the patient is Tsosie Billing, MD (Inactive)  Outpatient specialists:   LOS - 1  days   Medical records reviewed and are as summarized below:    Chief Complaint  Patient presents with  . Abdominal Pain       Brief summary   [name] is a 68 y.o. female with medical history significant of chronic cholelithiasis, coronary artery disease, asthma, GERD, hypertension, hyperlipidemia, iron deficiency anemia was recently discharged from the hospital (8/13-8/20) after being treated for acute cholecystitis with IV antibiotics and conservative management presents today with persistent abdominal pain for the last 2 to 3 days associated with some nausea and dry heaving. In ED, patient was afebrile, tachypneic, hypertensive, BP 184/100, creatinine 1.3, alk phos 879, lipase 82, AST 280, ALT 267. CT of the abdomen and pelvis showed cholelithiasis with gallbladder wall thickening representing acute cholecystitis, biliary dilatation measuring up to 14 mm, increased density in CBD may represent sludge versus choledocholithiasis.  Assessment & Plan   Acute cholecystitis with acute choledocholithiasis, possible perforation with elevated LFTs -Patient was recently admitted 8/13 to 8/20, had presented with similar events, CA-19-9 44, AFP was normal. -General surgery was consulted, patient was unable to have PERC drain as gallbladder was contracted.  Patient was felt high risk for injury to CBD or bowel injury, was conservatively managed with IV antibiotics and discharged home with plans to follow-up with Dr. Ninfa Linden in the office in 6 to 8 weeks for interval cholecystectomy. -Patient however  returned back with persistent abdominal pain, nausea and vomiting.  RUQ ultrasound showed 1.7 cm calcification within the gallbladder consistent with large gallstone, with questionable loculated area of perforation.  Slight intrahepatic biliary duct dilatation.  Surgery consulted. -GI consulted, per surgery, may need ERCP before perc chole drain versus open gallbladder surgery with cardiac clearance -Continue IV Zosyn -Hold statin due to transaminitis  Anemia of chronic disease -H&H currently stable, follow closely  Essential hypertension -BP currently stable, continue Norvasc, metoprolol   chronic CKD stage III  -Baseline creatinine ~1.5 -Creatinine stable at baseline  History of asthma -Currently stable, no wheezing  Diabetes mellitus type 2, IDDM -CBGs 80s to 90s, hold Lantus, continue sliding scale insulin  CAD status post stent in 2013 Hold Lipitor due to acute transaminitis, continue beta-blocker, amlodipine   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 Payton Mccallum, on letrozole and Herceptin, on hold due to acute illness  Code Status: Full CODE STATUS DVT Prophylaxis:  Lovenox Family Communication: Discussed all imaging results, lab results, explained to the patient    Disposition Plan: Remains inpatient for definitive management of acute cholecystitis, n.p.o. awaiting GI recommendations prior to surgical intervention  Time Spent in minutes 40 minutes  Procedures:  Right upper quadrant ultrasound  Consultants:   General surgery Gastroenterology-spoke with Nicoletta Ba for GI consult  Antimicrobials:   Anti-infectives (From admission, onward)   Start     Dose/Rate Route Frequency Ordered Stop   10/02/18 2000  piperacillin-tazobactam (ZOSYN) IVPB 3.375 g     3.375 g 12.5 mL/hr over 240 Minutes Intravenous Every 8 hours 10/02/18 1809     10/02/18 1030  cefTRIAXone (ROCEPHIN) 2 g in sodium chloride 0.9 % 100 mL  IVPB     2 g 200 mL/hr over 30 Minutes Intravenous  Once 10/02/18 1026 10/02/18 1130   10/02/18 1030  metroNIDAZOLE (FLAGYL) IVPB 500 mg     500 mg 100 mL/hr over 60 Minutes Intravenous  Once 10/02/18 1026 10/02/18 1235          Medications  Scheduled Meds: . ALPRAZolam  0.25 mg Oral QHS  . amLODipine  5 mg Oral Daily  . atorvastatin  20 mg Oral q1800  . enoxaparin (LOVENOX) injection  30 mg Subcutaneous Q24H  . insulin aspart  0-5 Units Subcutaneous QHS  . insulin aspart  0-9 Units Subcutaneous TID WC  . metoprolol succinate  50 mg Oral Daily  . mometasone-formoterol  2 puff Inhalation BID   Continuous Infusions: . sodium chloride 75 mL/hr at 10/02/18 2015  . piperacillin-tazobactam (ZOSYN)  IV 3.375 g (10/03/18 1232)   PRN Meds:.acetaminophen, albuterol, hydrALAZINE, HYDROmorphone (DILAUDID) injection, nitroGLYCERIN, ondansetron (ZOFRAN) IV      Subjective:   [name] was seen and examined today.  States feeling miserable, continues to have abdominal pain, 10 out of 10, controlled somewhat with pain medications.  Has nausea but no vomiting.  Low-grade temp 99.4 F.  Patient denies dizziness, chest pain, shortness of breath.   Objective:   Vitals:   10/02/18 2215 10/02/18 2245 10/03/18 0422 10/03/18 0813  BP: (!) 150/74  (!) 141/73   Pulse: 61  71   Resp: 16  16   Temp: 98.7 F (37.1 C)  99.4 F (37.4 C)   TempSrc: Oral  Oral   SpO2: 95% 96% 93% 95%  Weight:      Height:        Intake/Output Summary (Last 24 hours) at 10/03/2018 1237 Last data filed at 10/03/2018 6734 Gross per 24 hour  Intake 526.35 ml  Output 200 ml  Net 326.35 ml     Wt Readings from Last 3 Encounters:  10/02/18 56.7 kg  09/30/18 58.2 kg  09/25/18 58.6 kg     Exam  General: Alert and oriented x 3, NAD  Eyes:   HEENT:  Atraumatic, normocephalic, normal oropharynx  Cardiovascular: S1 S2 auscultated, no murmurs, RRR  Respiratory: Clear to auscultation bilaterally, no  wheezing, rales or rhonchi  Gastrointestinal: Soft, mild diffuse tenderness, worse in RUQ and epigastric region, ND, NBS  Ext: no pedal edema bilaterally  Neuro: No acute deficits  Musculoskeletal: No digital cyanosis, clubbing  Skin: No rashes  Psych: Normal affect and demeanor, alert and oriented x3    Data Reviewed:  I have personally reviewed following labs and imaging studies  Micro Results Recent Results (from the past 240 hour(s))  SARS CORONAVIRUS 2 (TAT 6-12 HRS) Nasal Swab Aptima Multi Swab     Status: None   Collection Time: 10/02/18  2:34 PM   Specimen: Aptima Multi Swab; Nasal Swab  Result Value Ref Range Status   SARS Coronavirus 2 NEGATIVE NEGATIVE Final    Comment: (NOTE) SARS-CoV-2 target nucleic acids are NOT DETECTED. The SARS-CoV-2 RNA is generally  detectable in upper and lower respiratory specimens during the acute phase of infection. Negative results do not preclude SARS-CoV-2 infection, do not rule out co-infections with other pathogens, and should not be used as the sole basis for treatment or other patient management decisions. Negative results must be combined with clinical observations, patient history, and epidemiological information. The expected result is Negative. Fact Sheet for Patients: SugarRoll.be Fact Sheet for Healthcare Providers: https://www.woods-mathews.com/ This test is not yet approved or cleared by the [place] FDA and  has been authorized for detection and/or diagnosis of SARS-CoV-2 by FDA under an Emergency Use Authorization (EUA). This EUA will remain  in effect (meaning this test can be used) for the duration of the COVID-19 declaration under Section 56 4(b)(1) of the Act, 21 U.S.C. section 360bbb-3(b)(1), unless the authorization is terminated or revoked sooner. Performed at [facility], [place] [address], [place], [place] [address]     Radiology Reports Nm  Hepatobiliary Liver Func  Result Date: 09/19/2018 CLINICAL DATA:  Abdominal pain, nausea and vomiting. Elevated bilirubin level. EXAM: NUCLEAR MEDICINE HEPATOBILIARY IMAGING TECHNIQUE: Sequential images of the abdomen were obtained out to 60 minutes following intravenous administration of radiopharmaceutical. RADIOPHARMACEUTICALS:  7.1 mCi Tc-29m Choletec IV COMPARISON:  MRI 09/18/2018 and CT AP 09/18/2018 FINDINGS: Prompt uptake and biliary excretion of activity by the liver is seen. Biliary activity passes into small bowel, consistent with patent common bile duct. After 1 hour of imaging no gallbladder activity identified. The patient was then given 2.4 mg of morphine sulfate, IV. Imaging was then subsequently performed for an additional 30 minutes without visualization of the gallbladder. IMPRESSION: 1. Nonvisualization of the gallbladder compatible with cystic duct obstruction which may be secondary to acute cholecystitis. 2. Patent common bile duct. Electronically Signed   By: TKerby MoorsM.D.   On: 09/19/2018 15:25   Ct Abdomen Pelvis W Contrast  Result Date: 10/02/2018 CLINICAL DATA:  68year old female with acute abdominal and pelvic pain and nausea. EXAM: CT ABDOMEN AND PELVIS WITH CONTRAST TECHNIQUE: Multidetector CT imaging of the abdomen and pelvis was performed using the standard protocol following bolus administration of intravenous contrast. CONTRAST:  838mOMNIPAQUE IOHEXOL 300 MG/ML  SOLN COMPARISON:  09/18/2018 CT FINDINGS: Lower chest: Bibasilar atelectasis is identified. Cardiomegaly again noted. Hepatobiliary: Cholelithiasis with gallbladder wall thickening again noted. There is intrahepatic and extrahepatic biliary dilatation to the ampulla with the CBD measuring up to 14 mm in greatest diameter. Slight increased density within the visualized CBD is noted and may represent sludge versus choledocholithiasis. Pancreas: Unremarkable Spleen: Unremarkable Adrenals/Urinary Tract: The  kidneys, adrenal glands and bladder are unremarkable except for unchanged bilateral renal cortical thinning and probable tiny bilateral renal cysts. There is no evidence of hydronephrosis or obstructing urinary calculi. Stomach/Bowel: Stomach is within normal limits. Surgical changes along the RIGHT colon again noted. No evidence of bowel obstruction. No evidence of bowel wall thickening, distention, or inflammatory changes. Vascular/Lymphatic: Aortic atherosclerosis. No enlarged abdominal or pelvic lymph nodes. Reproductive: Unremarkable except for a stable calcified RIGHT uterine fibroid. Other: No ascites, focal collection or pneumoperitoneum. Small bilateral inguinal hernias containing fat are again noted. Musculoskeletal: No acute or suspicious bony abnormality. IMPRESSION: 1. Cholelithiasis with gallbladder wall thickening likely representing acute cholecystitis. Intrahepatic and extrahepatic biliary dilatation to the ampulla with the CBD now measuring 14 mm. Increased density within the CBD may represent sludge versus choledocholithiasis. 2. Cardiomegaly and bibasilar atelectasis. 3. Unchanged small bilateral inguinal hernias containing fat. 4.  Aortic Atherosclerosis (  ICD10-I70.0). Electronically Signed   By: Margarette Canada M.D.   On: 10/02/2018 14:21   Ct Abdomen Pelvis W Contrast  Result Date: 09/18/2018 CLINICAL DATA:  Abdominal pain and distension. History of breast carcinoma EXAM: CT ABDOMEN AND PELVIS WITH CONTRAST TECHNIQUE: Multidetector CT imaging of the abdomen and pelvis was performed using the standard protocol following bolus administration of intravenous contrast. CONTRAST:  52m OMNIPAQUE IOHEXOL 300 MG/ML  SOLN COMPARISON:  December 18, 2017 FINDINGS: Lower chest: There is bibasilar atelectasis. There is mild bronchiectatic change in the lung bases as well. There are foci of coronary artery calcification. Hepatobiliary: There is hepatic steatosis. No focal liver lesions are evident. There is  cholelithiasis. The gallbladder wall is diffusely thickened and edematous. There is loss of fat plane between the gallbladder and the pylorus/first portion of the duodenum. There is no air within the gallbladder. There is no appreciable intrahepatic biliary duct dilatation. There is dilatation of the distal common bile duct, measuring 1.2 cm. No biliary duct mass or calculus evident. Pancreas: No pancreatic mass or inflammatory focus evident. No pancreatic duct dilatation. Spleen: No splenic lesions are evident. Adrenals/Urinary Tract: Adrenals bilaterally appear normal. There are subcentimeter cysts in the right kidney. There is an extrarenal pelvis on the right, an anatomic variant. There is no hydronephrosis on either side. There is or ureteral calculus on either side. Urinary bladder wall thickness is within normal limits. Stomach/Bowel: As noted above, there is loss of fat plane between the gallbladder and the pylorus/first portion of the duodenum. There is wall thickening in the distal stomach and proximal duodenum. There is no other appreciable bowel wall thickening. The patient has had a partial right colectomy with anastomosis patent in this area. Terminal ileum appears unremarkable. No evident bowel obstruction. There is no free air or portal venous air. Vascular/Lymphatic: There is aortic and iliac artery atherosclerosis. No aneurysm evident. Major mesenteric arterial vessels appear patent, although there is severe narrowing at the origin of the right renal artery and extensive calcification with severe narrowing in the proximal left renal artery. No adenopathy is evident in the abdomen or pelvis. Reproductive: There are calcifications in the uterus, felt to be indicative of leiomyomatous change. The uterus is slightly canted toward the right. No pelvic mass is demonstrable. Other: Appendix absent. There is no abscess or ascites evident in the abdomen or pelvis. There is fat in each inguinal ring.  Musculoskeletal: There are no blastic or lytic bone lesions. There is bony hypertrophy at L4 and diffuse disc protrusion. These findings in concert leads spinal stenosis. Similar changes are noted at L3-4 with spinal stenosis also present at L3-4. No blastic or lytic bone lesions. No intramuscular lesions are evident. IMPRESSION: 1. There is cholelithiasis with diffuse gallbladder wall thickening and edema. There is loss of fat plane between the gallbladder and the distal stomach/proximal duodenum. 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 No air seen in the gallbladder. 2. There is dilatation of the common hepatic and common bile ducts. No intrahepatic biliary duct dilatation seen. No biliary duct mass or calculus evident. A mass at the ampulla could cause these findings. From an imaging standpoint, MRCP would be the optimum study of choice to further evaluate in this regard. 3.  Hepatic steatosis.  No focal liver lesions appreciable. 4. No bowel obstruction. No abscess in the abdomen or pelvis. The patient is status post partial right colectomy with anastomosis patent. 5. Spinal stenosis at L3-4 and L4-5 due to disc protrusions and bony  hypertrophy at these levels. 6. Extensive calcification and narrowing in the proximal renal arteries bilaterally. Question whether patient is hypertensive; there may well be renovascular hypertension given this appearance. 7.  Extensive aortic and iliac artery atherosclerosis. 8. Calcifications in the uterus consistent with leiomyomatous change. Electronically Signed   By: Lowella Grip III M.D.   On: 09/18/2018 14:26   Mr Abdomen Mrcp Wo Contrast  Result Date: 09/18/2018 CLINICAL DATA:  Abdominal pain/distension, Hemoccult-positive, history of breast cancer status post right mastectomy EXAM: MRI ABDOMEN WITHOUT CONTRAST  (INCLUDING MRCP) TECHNIQUE: Multiplanar multisequence MR imaging of the abdomen was performed. Heavily T2-weighted images of the  biliary and pancreatic ducts were obtained, and three-dimensional MRCP images were rendered by post processing. COMPARISON:  CT abdomen/pelvis dated 09/18/2018 FINDINGS: Motion degraded images. Lower chest: Lung bases are clear. Hepatobiliary: Suspected iron deposition in the liver. No focal hepatic lesion is seen. Cholelithiasis with gallbladder wall thickening/inflammatory changes. Associated 1.9 x 2.8 cm cystic lesion along the wall of the gallbladder (series 8/image 26) favors localized perforation. No intrahepatic or extrahepatic ductal dilatation. Pancreas:  Within normal limits. Spleen:  Within normal limits. Adrenals/Urinary Tract:  Adrenal glands are within normal limits. 8 mm posterior right upper pole renal cyst with mild right renal cortical scarring. Left kidney is within normal limits. No hydronephrosis. Stomach/Bowel: Stomach is within normal limits. Proximal duodenum is mildly thick-walled and adherent to the gallbladder (series 8/image 27). While direct fistulous communication is not demonstrated on MR, this may reflect involvement of the inflammatory process. Visualized bowel is otherwise unremarkable. Vascular/Lymphatic:  No evidence of abdominal aortic aneurysm. No suspicious abdominal lymphadenopathy. Other:  No abdominal ascites. Musculoskeletal: No focal osseous lesions. IMPRESSION: Cholelithiasis with acute cholecystitis. Associated 2.8 cm cystic lesion along the wall the gallbladder favors localized perforation. Adherent proximal duodenum with mild wall thickening suggest involvement with the right upper quadrant inflammatory process. Direct fistulous communication is not demonstrated on MR. Additional ancillary findings as above. Electronically Signed   By: Julian Hy M.D.   On: 09/18/2018 22:04   Dg Abdomen Acute W/chest  Result Date: 09/18/2018 CLINICAL DATA:  Acute generalized abdominal pain. EXAM: DG ABDOMEN ACUTE W/ 1V CHEST COMPARISON:  Radiographs of April 10, 2018 and  February 27, 2015. FINDINGS: There is no evidence of dilated bowel loops or free intraperitoneal air. No radiopaque calculi or other significant radiographic abnormality is seen. Stable cardiomediastinal silhouette is noted. Atherosclerosis of thoracic aorta is noted. Left internal jugular Port-A-Cath is noted in grossly good position. Both lungs are clear. IMPRESSION: No evidence of bowel obstruction or ileus. No acute cardiopulmonary disease. Aortic Atherosclerosis (ICD10-I70.0). Electronically Signed   By: Marijo Conception M.D.   On: 09/18/2018 13:24   US Abdomen Limited Ruq  Result Date: 10/02/2018 CLINICAL DATA:  Severe abdominal pain EXAM: ULTRASOUND ABDOMEN LIMITED RIGHT UPPER QUADRANT COMPARISON:  Ultrasound right upper quadrant September 19, 2018; CT abdomen and pelvis September 18 2018 and abdominal MRI September 18, 2018 FINDINGS: Gallbladder: There is a 1.7 cm calcification within the gallbladder consistent with a large gallstone. The gallbladder wall remains thickened edematous with localized fluid which may represent loculated perforation of the gallbladder. Patient is focally tender over the gallbladder. Common bile duct: Diameter: 7 mm. There is slight intrahepatic biliary duct dilatation. Liver: No focal lesion identified. Within normal limits in parenchymal echogenicity. Portal vein is patent on color Doppler imaging with normal direction of blood flow towards the liver. Other: None. IMPRESSION: Findings in the gallbladder indicative of acute  cholecystitis with questionable loculated area of perforation. Common bile duct upper normal in size. Slight degree of intrahepatic biliary duct dilatation felt to be present. Electronically Signed   By: Lowella Grip III M.D.   On: 10/02/2018 11:38   US Abdomen Limited Ruq  Result Date: 09/19/2018 CLINICAL DATA:  Cholecystitis EXAM: ULTRASOUND ABDOMEN LIMITED RIGHT UPPER QUADRANT COMPARISON:  MRI September 18, 2018, CT September 18, 2018. FINDINGS: Gallbladder:  There is a large echogenic mass seen on the peripheral outer wall of the gallbladder with posterior shadowing, likely calcified mass as seen on prior MRI and CT exam. This measures 1.4 by 1.5 x 1.2 cm. There is diffuse gallbladder wall thickening measuring 6 mm. No definite pericholecystic fluid is seen. No sonographic Percell Miller sign is seen. Common bile duct: Diameter: 4 mm. Liver: No focal lesion identified. Within normal limits in parenchymal echogenicity. Portal vein is patent on color Doppler imaging with normal direction of blood flow towards the liver. Other: None. IMPRESSION: 1. Large calcified mass seen on the peripheral outer wall of the gallbladder measuring 1.4 x 1.5 x 1.2 cm with diffuse thickened gallbladder wall. Findings suggestive of cholecystitis. 2. Normal appearing liver. 3. No extrahepatic biliary ductal dilatation. Electronically Signed   By: Prudencio Pair M.D.   On: 09/19/2018 18:11    Lab Data:  CBC: Recent Labs  Lab 10/02/18 1020  WBC 6.8  HGB 10.6*  HCT 33.6*  MCV 100.0  PLT 828   Basic Metabolic Panel: Recent Labs  Lab 10/02/18 1020  NA 140  K 3.4*  CL 110  CO2 20*  GLUCOSE 115*  BUN 15  CREATININE 1.37*  CALCIUM 8.4*   GFR: Estimated Creatinine Clearance: 29.3 mL/min (A) (by C-G formula based on SCr of 1.37 mg/dL (H)). Liver Function Tests: Recent Labs  Lab 10/02/18 1020  AST 280*  ALT 267*  ALKPHOS 879*  BILITOT 3.7*  PROT 7.1  ALBUMIN 3.2*   Recent Labs  Lab 10/02/18 1020  LIPASE 82*   No results for input(s): AMMONIA in the last 168 hours. Coagulation Profile: No results for input(s): INR, PROTIME in the last 168 hours. Cardiac Enzymes: No results for input(s): CKTOTAL, CKMB, CKMBINDEX, TROPONINI in the last 168 hours. BNP (last 3 results) No results for input(s): PROBNP in the last 8760 hours. HbA1C: No results for input(s): HGBA1C in the last 72 hours. CBG: Recent Labs  Lab 10/02/18 2208 10/03/18 0729 10/03/18 1145  GLUCAP 97  82 92   Lipid Profile: No results for input(s): CHOL, HDL, LDLCALC, TRIG, CHOLHDL, LDLDIRECT in the last 72 hours. Thyroid Function Tests: No results for input(s): TSH, T4TOTAL, FREET4, T3FREE, THYROIDAB in the last 72 hours. Anemia Panel: No results for input(s): VITAMINB12, FOLATE, FERRITIN, TIBC, IRON, RETICCTPCT in the last 72 hours. Urine analysis:    Component Value Date/Time   COLORURINE YELLOW 11/27/2015 0743   APPEARANCEUR CLOUDY (A) 11/27/2015 0743   LABSPEC 1.019 11/27/2015 0743   PHURINE 6.0 11/27/2015 0743   GLUCOSEU NEGATIVE 11/27/2015 0743   HGBUR NEGATIVE 11/27/2015 0743   BILIRUBINUR NEGATIVE 11/27/2015 0743   KETONESUR NEGATIVE 11/27/2015 0743   PROTEINUR NEGATIVE 11/27/2015 0743   UROBILINOGEN 0.2 03/31/2014 2017   NITRITE NEGATIVE 11/27/2015 0743   LEUKOCYTESUR MODERATE (A) 11/27/2015 0743     Jalasia Eskridge M.D. [facility] 10/03/2018, 12:37 PM  Pager: [phone] Between 7am to 7pm - call Pager - [phone]  After 7pm go to [email or web address] - password [email or web address]  Call night coverage person covering after 7pm

## 2018-10-03 NOTE — H&P (View-Only) (Signed)
Consultation  Referring Provider: TRH/ Rai Primary Care Physician:  Tsosie Billing, MD (Inactive) Primary Gastroenterologist:  Dr.Gessner  Reason for Consultation: Recurrent epigastric pain, jaundice and dilated CBD  HPI: [name] is a 68 y.o. female, who was initially admitted 8/13 through 09/25/2018 with acute cholecystitis.  Imaging at that time with MRCP was negative for choledocholithiasis but did show a 2.8 cm cystic lesion along the gallbladder wall favoring localized perforation There was also adherent proximal duodenum with mild wall thickening suggesting involvement with the right upper quadrant inflammatory process there was no fistulous communication demonstrated. Patient , was treated with IV antibiotics, had IR consult , but decision made not to do Perc drainage of GB. She improved with ABXand discharged to home with plans to follow-up with Dr. Ninfa Linden, and have cholecystectomy in 6 to 8 weeks.  She presented back to the emergency room last night with recurrent epigastric pain nausea and vomiting. Upper abdominal ultrasound shows a 1.7 cm gallstone in the gallbladder, edematous gallbladder with localized question loculated perforation of gallbladder, CBD 7 mm  CT of the abdomen pelvis yesterday showed gallstones with gallbladder wall thickening consistent with acute cholecystitis.  Now with CBD of 14 mm and dense material in the CBD felt consistent with sludge versus stones.  On admit WBC 6.8, hemoglobin 10.6, creatinine 1.3 T bili 3.7/alk phos 879/AST 280/ALT 267 and lipase 82.  She was started on IV Zosyn. She feels about the same today, still having pain and feels nauseated.  Low-grade temp in the 99 4 range  Other medical problems include coronary artery disease status post prior MI and stenting, only maintained on aspirin.  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 Is also status post right colectomy for a post polypectomy perforation in 2009.   Past Medical History:  Diagnosis Date  . Angina   . Arthritis    HANDS"  . Asthma   . Cancer ([place]) 09/04/2017   Right Breast  . Carpal tunnel syndrome, bilateral 11/26/2016  . Cholelithiasis   . Cocaine abuse ([place]) 07/2012   per E.R. drug screen  . Coronary artery disease   . Diabetes mellitus without mention of complication    type 2  . Dysphagia    esophageal dysmotility on 03/2011 esophagram   . Dyspnea    ongoing   . Fatty liver disease, nonalcoholic 2426   on Imaging.   Marland Kitchen GERD (gastroesophageal reflux disease)   . Headache   . History of colonic polyps 2009   adenomatous 2009, HP in 2009, 2011, 2013.   Marland Kitchen Hyperlipidemia   . Hypertension   . Iron deficiency anemia   . Myocardial infarction ([place])    years ago, 6 stents placed  . 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 ABDOMINAL ANGIOGRAM  02/20/2011   Procedure: ABDOMINAL ANGIOGRAM;  Surgeon: Clent Demark, MD;  Location: [facility] CATH LAB;  Service: Cardiovascular;;  . CARPAL TUNNEL RELEASE Right 07/04/2017   Procedure: RIGHT CARPAL TUNNEL RELEASE;  Surgeon: Leanora Cover, MD;  Location: [place];  Service: Orthopedics;  Laterality: Right;  . CARPAL TUNNEL RELEASE Left 09/12/2017   Procedure: LEFT CARPAL TUNNEL RELEASE;  Surgeon: Leanora Cover, MD;  Location: [place];  Service: Orthopedics;  Laterality: Left;  . COLONOSCOPY  11/30/2011   Procedure: COLONOSCOPY;  Surgeon: Sydell Axon  Andris Baumann, MD;  Location: [facility] ENDOSCOPY;  Service: Endoscopy;  Laterality: N/A;  . CORONARY ANGIOPLASTY WITH STENT PLACEMENT    . ESOPHAGOGASTRODUODENOSCOPY  11/30/2011   Procedure: ESOPHAGOGASTRODUODENOSCOPY (EGD);  Surgeon: Lafayette Dragon, MD;  Location: [facility] ENDOSCOPY;  Service: Endoscopy;  Laterality: N/A;  . ESOPHAGOGASTRODUODENOSCOPY  N/A 02/23/2015   Procedure: ESOPHAGOGASTRODUODENOSCOPY (EGD);  Surgeon: Gatha Mayer, MD;  Location: [facility] ENDOSCOPY;  Service: Endoscopy;  Laterality: N/A;  . LEFT HEART CATHETERIZATION WITH CORONARY ANGIOGRAM N/A 02/20/2011   Procedure: LEFT HEART CATHETERIZATION WITH CORONARY ANGIOGRAM;  Surgeon: Clent Demark, MD;  Location: [place] CATH LAB;  Service: Cardiovascular;  Laterality: N/A;  . MASTECTOMY Right 11/05/2017  . MASTECTOMY W/ SENTINEL NODE BIOPSY Right 11/27/2017  . MASTECTOMY W/ SENTINEL NODE BIOPSY Right 11/27/2017   Procedure: RIGHT TOTAL MASTECTOMY WITH SENTINEL LYMPH NODE BIOPSY;  Surgeon: Excell Seltzer, MD;  Location: [place];  Service: General;  Laterality: Right;  . PORT A CATH REVISION Left 05/07/2018   Procedure: PORT A CATH REVISION;  Surgeon: Excell Seltzer, MD;  Location: [facility] ORS;  Service: General;  Laterality: Left;  . PORTACATH PLACEMENT Left 11/27/2017   Procedure: INSERTION PORT-A-CATH;  Surgeon: Excell Seltzer, MD;  Location: [place];  Service: General;  Laterality: Left;  . RIGHT COLECTOMY  02/2007   for post polypectomy colonic perforation  . TUBAL LIGATION      Prior to Admission medications   Medication Sig Start Date End Date Taking? Authorizing Provider  acetaminophen (TYLENOL) 325 MG tablet Take 2 tablets (650 mg total) by mouth every 4 (four) hours as needed for headache or mild pain. 07/14/18  Yes Charolette Forward, MD  albuterol (PROVENTIL) (2.5 MG/3ML) 0.083% nebulizer solution INHALE THE CONTENTS OF 1 VIAL USING NEBULIZER EVERY 6 HOURS AS NEEDEDFOR WHEEZING OR SHORTNESS OF BREATH Patient taking differently: Take 2.5 mg by nebulization every 6 (six) hours as needed for wheezing or shortness of breath.  08/07/18  Yes Nicholas Lose, MD  allopurinol (ZYLOPRIM) 100 MG tablet Take 100 mg by mouth daily. 09/17/18  Yes [provider]  ALPRAZolam Duanne Moron) 0.25 MG tablet Take 0.25 mg by mouth at bedtime.    Yes [provider]  amLODipine (NORVASC) 5  MG tablet Take 5 mg by mouth daily.   Yes [provider]  anastrozole (ARIMIDEX) 1 MG tablet Take 1 tablet (1 mg total) by mouth daily. 02/27/18  Yes Nicholas Lose, MD  aspirin EC 81 MG EC tablet Take 1 tablet (81 mg total) by mouth daily. 02/06/12  Yes Patrecia Pour, NP  atorvastatin (LIPITOR) 20 MG tablet Take 20 mg by mouth daily at 6 PM.    Yes [provider]  budesonide-formoterol (SYMBICORT) 160-4.5 MCG/ACT inhaler Inhale 1 puff into the lungs 2 (two) times daily.    Yes [provider]  colchicine 0.6 MG tablet Take 0.6 mg by mouth daily as needed for pain. 07/17/18  Yes [provider]  feeding supplement, ENSURE ENLIVE, (ENSURE ENLIVE) LIQD Take 237 mLs by mouth 2 (two) times daily between meals. 02/24/15  Yes Ghimire, Henreitta Leber, MD  LANTUS SOLOSTAR 100 UNIT/ML Solostar Pen Inject 10 Units into the skin daily. 07/24/18  Yes [provider]  loperamide (IMODIUM) 2 MG capsule Take 1 capsule (2 mg total) by mouth 4 (four) times daily as needed for diarrhea or loose stools. 02/27/15  Yes Marella Chimes, PA-C  metoprolol succinate (TOPROL-XL) 50 MG 24 hr tablet Take 50 mg by mouth daily. Take with or  immediately following a meal.   Yes [provider]  nitroGLYCERIN (NITROSTAT) 0.4 MG SL tablet Place 1 tablet (0.4 mg total) under the tongue every 5 (five) minutes as needed. For chest pain Patient taking differently: Place 0.4 mg under the tongue every 5 (five) minutes as needed for chest pain.  02/06/12  Yes Patrecia Pour, NP  Omega-3 Fatty Acids (FISH OIL) 1000 MG CAPS Take 1,000 mg by mouth daily.   Yes [provider]  ondansetron (ZOFRAN) 8 MG tablet Take 1 tablet (8 mg total) by mouth every 8 (eight) hours as needed for nausea or vomiting. 06/16/18  Yes Nicholas Lose, MD  saccharomyces boulardii (FLORASTOR) 250 MG capsule Take 1 capsule (250 mg total) by mouth 2 (two) times daily for 15 days. 09/25/18 10/10/18 Yes Florencia Reasons, MD   traMADol (ULTRAM) 50 MG tablet Take 50-100 mg by mouth every 6 (six) hours as needed for pain. 09/17/18  Yes [provider]    Current Facility-Administered Medications  Medication Dose Route Frequency Provider Last Rate Last Dose  . 0.9 %  sodium chloride infusion   Intravenous Continuous Hosie Poisson, MD 75 mL/hr at 10/02/18 2015    . acetaminophen (TYLENOL) tablet 650 mg  650 mg Oral Q4H PRN Hosie Poisson, MD      . albuterol (PROVENTIL) (2.5 MG/3ML) 0.083% nebulizer solution 2.5 mg  2.5 mg Nebulization Q6H PRN Hosie Poisson, MD      . ALPRAZolam Duanne Moron) tablet 0.25 mg  0.25 mg Oral QHS Hosie Poisson, MD   0.25 mg at 10/02/18 2239  . amLODipine (NORVASC) tablet 5 mg  5 mg Oral Daily Hosie Poisson, MD   5 mg at 10/03/18 1229  . atorvastatin (LIPITOR) tablet 20 mg  20 mg Oral q1800 Hosie Poisson, MD      . enoxaparin (LOVENOX) injection 30 mg  30 mg Subcutaneous Q24H Hosie Poisson, MD   30 mg at 10/02/18 2239  . hydrALAZINE (APRESOLINE) injection 5 mg  5 mg Intravenous Q6H PRN Hosie Poisson, MD      . HYDROmorphone (DILAUDID) injection 0.5 mg  0.5 mg Intravenous Q4H PRN Hosie Poisson, MD   0.5 mg at 10/03/18 0716  . insulin aspart (novoLOG) injection 0-5 Units  0-5 Units Subcutaneous QHS Hosie Poisson, MD      . insulin aspart (novoLOG) injection 0-9 Units  0-9 Units Subcutaneous TID WC Hosie Poisson, MD      . metoprolol succinate (TOPROL-XL) 24 hr tablet 50 mg  50 mg Oral Daily Hosie Poisson, MD   50 mg at 10/03/18 1230  . mometasone-formoterol (DULERA) 200-5 MCG/ACT inhaler 2 puff  2 puff Inhalation BID Hosie Poisson, MD   2 puff at 10/03/18 0812  . nitroGLYCERIN (NITROSTAT) SL tablet 0.4 mg  0.4 mg Sublingual Q5 min PRN Hosie Poisson, MD      . ondansetron (ZOFRAN) injection 4 mg  4 mg Intravenous Q6H PRN Hosie Poisson, MD      . piperacillin-tazobactam (ZOSYN) IVPB 3.375 g  3.375 g Intravenous Q8H Thomes Lolling, RPH 12.5 mL/hr at 10/03/18 1232 3.375 g at 10/03/18 1232     Allergies as of 10/02/2018  . (No Known Allergies)    Family History  Problem Relation Age of Onset  . Heart disease Mother   . Diabetes Mother   . Cancer Father        unsure what kind  . Diabetes Sister   . Colon cancer Neg Hx   . Breast cancer Neg  Hx     Social History   Socioeconomic History  . Marital status: Divorced    Spouse name: Not on file  . Number of children: 2  . Years of education: Not on file  . Highest education level: Not on file  Occupational History  . Occupation: retired. prev worked in housekeeping w/ [employer]  . Financial resource strain: Not on file  . Food insecurity    Worry: Not on file    Inability: Not on file  . Transportation needs    Medical: Yes    Non-medical: Yes  Tobacco Use  . Smoking status: Former Smoker    Packs/day: 0.50    Years: 47.00    Pack years: 23.50    Types: Cigarettes    Quit date: 05/09/2010    Years since quitting: 8.4  . Smokeless tobacco: Never Used  Substance and Sexual Activity  . Alcohol use: No  . Drug use: Not Currently    Types: Cocaine    Comment: positive drug screen (cocaine, benzo's)07/2012 in emergency room  . Sexual activity: Not Currently    Birth control/protection: Post-menopausal  Lifestyle  . Physical activity    Days per week: Not on file    Minutes per session: Not on file  . Stress: Not on file  Relationships  . Social Herbalist on phone: Not on file    Gets together: Not on file    Attends religious service: Not on file    Active member of club or organization: Not on file    Attends meetings of clubs or organizations: Not on file    Relationship status: Not on file  . Intimate partner violence    Fear of current or ex partner: Not on file    Emotionally abused: Not on file    Physically abused: Not on file    Forced sexual activity: Not on file  Other Topics Concern  . Not on file  Social History Narrative   Caffeine use daily.    Review of  Systems: Pertinent positive and negative review of systems were noted in the above HPI section.  All other review of systems was otherwise negative.  Physical Exam: Vital signs in last 24 hours: Temp:  [98.7 F (37.1 C)-99.4 F (37.4 C)] 99.4 F (37.4 C) (08/28 0422) Pulse Rate:  [54-76] 71 (08/28 0422) Resp:  [11-20] 16 (08/28 0422) BP: (113-167)/(69-90) 141/73 (08/28 0422) SpO2:  [93 %-99 %] 95 % (08/28 0813) Weight:  [56.7 kg] 56.7 kg (08/27 1859) Last BM Date: 10/01/18 General:   Alert,  Well-developed, well-nourished, older African-American female pleasant and cooperative in NAD.  Jaundiced Head:  Normocephalic and atraumatic. Eyes:  Sclera with early icterus conjunctiva pink. Ears:  Normal auditory acuity. Nose:  No deformity, discharge,  or lesions. Mouth:  No deformity or lesions.   Neck:  Supple; no masses or thyromegaly. Lungs:  Clear throughout to auscultation.   No wheezes, crackles, or rhonchi.   Heart:  Regular rate and rhythm; no murmurs, clicks, rubs,  or gallops. Abdomen:  Soft,tender epigastrium and right upper quadrant, no rebound, BS active,nonpalp mass or hsm.  Midline incisional scar Rectal:  Deferred  Msk:  Symmetrical without gross deformities. . Pulses:  Normal pulses noted. Extremities:  Without clubbing or edema. Neurologic:  Alert and  oriented x4;  grossly normal neurologically. Skin:  Intact without significant lesions or rashes.. Psych:  Alert and cooperative. Normal mood and affect.  Intake/Output  from previous day: 08/27 0701 - 08/28 0700 In: 526.4 [I.V.:426.4; IV Piggyback:100] Out: 200 [Urine:200] Intake/Output this shift: No intake/output data recorded.  Lab Results: Recent Labs    10/02/18 1020  WBC 6.8  HGB 10.6*  HCT 33.6*  PLT 363   BMET Recent Labs    10/02/18 1020  NA 140  K 3.4*  CL 110  CO2 20*  GLUCOSE 115*  BUN 15  CREATININE 1.37*  CALCIUM 8.4*   LFT Recent Labs    10/02/18 1020  PROT 7.1  ALBUMIN 3.2*   AST 280*  ALT 267*  ALKPHOS 879*  BILITOT 3.7*   PT/INR No results for input(s): LABPROT, INR in the last 72 hours. Hepatitis Panel No results for input(s): HEPBSAG, HCVAB, HEPAIGM, HEPBIGM in the last 72 hours.    IMPRESSION:  #44 68 year old African-American female admitted yesterday with acute epigastric pain nausea and vomiting.  She was found to be jaundiced and has transaminitis. Imaging with upper abdominal ultrasound and CT of the abdomen and pelvis show cholelithiasis, with edematous gallbladder wall consistent with cholecystitis and small common bile duct and intrahepatic ductal dilation, with density within the CBD consistent with stones versus sludge.  #2 Recent admission 2 weeks ago with acute cholecystitis and localized perforation of the gallbladder. She also had some duodenal inflammation with the proximal duodenum adherent to the gallbladder.  She was not felt to be a good candidate for percutaneous drainage of the gallbladder, and fortunately improved with antibiotics and was discharged with plans to have cholecystectomy in several weeks.  #2 coronary artery disease status post prior MI and remote stents-on no blood thinners, just aspirin #3.  History of CVA #4.  Insulin-dependent diabetes mellitus #5.  Chronic kidney disease #6.  Status post remote right colectomy #7.  Right breast DCIS-on chemotherapy with Herceptin.   Plan: Keep n.p.o. Patient has been scheduled for ERCP with probable sphincterotomy and stone extraction with Dr. Fuller Plan this afternoon.  Procedure has been discussed in detail with the patient and also her sister(per phone) including indications risks and benefits and patient is agreeable to proceed.  Continue Zosyn Hold Lovenox tonight Repeat labs in a.m. Plans regarding timing of cholecystectomy per surgery.  Amy Esterwood PA-C 10/03/2018, 12:40 PM     Attending Physician Note   I have taken a history, examined the patient and reviewed  the chart. I agree with the Advanced Practitioner's note, impression and recommendations.   Epigastric pain, N/V, elevated LFTs, jaundice and CT AP yesterday showing cholelithiasis, GB wall thickening, intrahepatic and extrahepatic biliary dilation to 14 mm and a density within CBD - stone vs sludge. Interesting RUQ [place] yesterday showed 7 mm CBD. Recent hospitalization for acute cholecystitis, cholelithiasis, a localized gallbladder perf - not felt to be a good candidate for a perc GB drain so treated with IV antibiotics.  * ERCP today for choledocholithiasis with obstruction. The risks (including pancreatitis, bleeding, perforation, infection, missed lesions, medication reactions and possible hospitalization or surgery if complications occur), benefits, and alternatives to ERCP with possible sphincterotomy and possible stent were discussed with the patient and they consent to proceed.  * Cholecystitis, cholelithiasis mgmt per general surgery. IV Zosyn for now.  Lucio Edward, MD [facility]

## 2018-10-03 NOTE — Progress Notes (Signed)
CC:  Abdominal pain  Subjective: she is still having abdominal pain, complains of RUQ, midepigastric area, and some lesser discomfort RUQ.  No BM or flatus, last BM 10/01/18  Objective: Vital signs in last 24 hours: Temp:  [98.4 F (36.9 C)-99.4 F (37.4 C)] 99.4 F (37.4 C) (08/28 0422) Pulse Rate:  [54-76] 71 (08/28 0422) Resp:  [11-24] 16 (08/28 0422) BP: (107-184)/(66-100) 141/73 (08/28 0422) SpO2:  [91 %-99 %] 95 % (08/28 0813) Weight:  [56.7 kg-58.1 kg] 56.7 kg (08/27 1859) Last BM Date: 10/01/18 I/O= 526 IV 200 urine AFEBRILE, VSS No labs this AM CT :  Cholelithiasis with GB thickening; likely acute cholecystitis. Intrahepatic and extrahepatic biliary dilatation to the ampulla with the CBD now measuring 14 mm. Possible choledocholithiasis Bilateral inguinal hernias with fat abd UD 8/27:  Acute cholecystitis with possible perforation Intake/Output from previous day: 08/27 0701 - 08/28 0700 In: 526.4 [I.V.:426.4; IV Piggyback:100] Out: 200 [Urine:200] Intake/Output this shift: No intake/output data recorded.  General appearance: alert, cooperative and no distress Resp: clear to auscultation bilaterally GI: soft tender upper abdomen, RUQ and mid epigastric areas are the worst.  Lab Results:  Recent Labs    10/02/18 1020  WBC 6.8  HGB 10.6*  HCT 33.6*  PLT 363    BMET Recent Labs    10/02/18 1020  NA 140  K 3.4*  CL 110  CO2 20*  GLUCOSE 115*  BUN 15  CREATININE 1.37*  CALCIUM 8.4*   PT/INR No results for input(s): LABPROT, INR in the last 72 hours.  Recent Labs  Lab 10/02/18 1020  AST 280*  ALT 267*  ALKPHOS 879*  BILITOT 3.7*  PROT 7.1  ALBUMIN 3.2*     Lipase     Component Value Date/Time   LIPASE 82 (H) 10/02/2018 1020     Medications: . ALPRAZolam  0.25 mg Oral QHS  . amLODipine  5 mg Oral Daily  . atorvastatin  20 mg Oral q1800  . enoxaparin (LOVENOX) injection  30 mg Subcutaneous Q24H  . insulin aspart  0-5 Units  Subcutaneous QHS  . insulin aspart  0-9 Units Subcutaneous TID WC  . metoprolol succinate  50 mg Oral Daily  . mometasone-formoterol  2 puff Inhalation BID   . sodium chloride 75 mL/hr at 10/02/18 2015  . piperacillin-tazobactam (ZOSYN)  IV 3.375 g (10/03/18 0352)    Assessment/Plan CAD Hx of MI with stenting Hx CVA HTN HLD IDDM CKD Anemia Rright DCIS that is ER positive currently on oral Herceptin Hx of right colectomy for post polypectomy colonic perforation in 2009  Cholelithiasis/Acute Cholecystitis with possible perforation Elevated LFTs - Admitted 8/13 - 09/25/18 and underwent extensive workup as noted above - During last admission CA-19-9 was checked and was noted to be elevated at 44.  AFP tumor marker was normal.   - She was unable to have Perc Drain placed last admission as GB was to contracted -  It was felt that because of the inflammation seen on imaging that surgery would be high risk for injury to the CBD or bowel injury.  She was treated with IV antibiotics and her symptoms improved.  She was discharged home with plans for follow-up with Dr. Ninfa Linden in the office in 6-8 weeks to consider interval cholecystectomy.  - Workup today with normal WBC, elevated LFTs and T bili, and RUQ ultrasound that shows 1.7 cm calcification within the gallbladder consistent with a large gallstone. There is also a with localized fluid  which measures a loculated perforation of the gallbladder. Common bile duct is 7 mm also some slight intrahepatic biliary ductal dilatation. - EDP to contact GI about elevated LFT's and CBD. Her CA 19-9 was also elevated slightly during last admission. Appreciate their assistance.  -Would recommend repeating a CT scan to see if patient would now be amenable for Perc Chole drain.  Patient is still high risk for CBD or bowel injury with surgery.  Given the degree of inflammation on imaging, if Perc Chole drain was not able to be obtained, she would likely need a  open gallbladder surgery and need cardiac clearance CT with Choledocholithiasis. Await GI recs.   FEN:  NPO/IV fluids ID:  Zosyn 8/27 >> day 2 DVT:  Lovenox Follow UP:  To be determined POC: KEITA, DEMARCO [phone]  [place] Sister   [phone]   Plan:  GI consult, ? ERCP/MRI.  We will follow with youl       LOS: 1 day    Kayston Jodoin 10/03/2018 [phone]

## 2018-10-03 NOTE — Interval H&P Note (Signed)
History and Physical Interval Note:  10/03/2018 2:07 PM  [name]  has presented today for surgery, with the diagnosis of CBD stone.  The various methods of treatment have been discussed with the patient and family. After consideration of risks, benefits and other options for treatment, the patient has consented to  Procedure(s): ENDOSCOPIC RETROGRADE CHOLANGIOPANCREATOGRAPHY (ERCP) (N/A) as a surgical intervention.  The patient's history has been reviewed, patient examined, no change in status, stable for surgery.  I have reviewed the patient's chart and labs.  Questions were answered to the patient's satisfaction.     Pricilla Riffle. Fuller Plan

## 2018-10-03 NOTE — Progress Notes (Signed)
Pt HR sustaining 50s. Asymptomatic. BP 137/59, Resp 12. Notified MD. Orders received. Will continue to monitor.

## 2018-10-03 NOTE — Transfer of Care (Signed)
Immediate Anesthesia Transfer of Care Note  Patient: [name]  Procedure(s) Performed: ENDOSCOPIC RETROGRADE CHOLANGIOPANCREATOGRAPHY (ERCP) (N/A ) SPHINCTEROTOMY  Patient Location: PACU and Endoscopy Unit  Anesthesia Type:General  Level of Consciousness: awake, alert  and oriented  Airway & Oxygen Therapy: Patient Spontanous Breathing and Patient connected to nasal cannula oxygen  Post-op Assessment: Report given to RN and Post -op Vital signs reviewed and stable  Post vital signs: Reviewed and stable  Last Vitals:  Vitals Value Taken Time  BP    Temp    Pulse    Resp    SpO2      Last Pain:  Vitals:   10/03/18 1404  TempSrc: Oral  PainSc: 4       Patients Stated Pain Goal: 2 (18/34/37 3578)  Complications: No apparent anesthesia complications

## 2018-10-03 NOTE — Anesthesia Postprocedure Evaluation (Signed)
Anesthesia Post Note  Patient: [name]  Procedure(s) Performed: ENDOSCOPIC RETROGRADE CHOLANGIOPANCREATOGRAPHY (ERCP) (N/A ) SPHINCTEROTOMY     Patient location during evaluation: PACU Anesthesia Type: General Level of consciousness: awake and alert Pain management: pain level controlled Vital Signs Assessment: post-procedure vital signs reviewed and stable Respiratory status: spontaneous breathing, nonlabored ventilation, respiratory function stable and patient connected to nasal cannula oxygen Cardiovascular status: blood pressure returned to baseline and stable Postop Assessment: no apparent nausea or vomiting Anesthetic complications: no    Last Vitals:  Vitals:   10/03/18 1550 10/03/18 1701  BP: (!) 137/59   Pulse: 65 (!) 53  Resp: 15 12  Temp:    SpO2: 95%     Last Pain:  Vitals:   10/03/18 1550  TempSrc:   PainSc: 0-No pain                 Effie Berkshire

## 2018-10-04 DIAGNOSIS — K838 Other specified diseases of biliary tract: Secondary | ICD-10-CM

## 2018-10-04 LAB — CBC
HCT: 28.4 % — ABNORMAL LOW (ref 36.0–46.0)
Hemoglobin: 8.7 g/dL — ABNORMAL LOW (ref 12.0–15.0)
MCH: 31.1 pg (ref 26.0–34.0)
MCHC: 30.6 g/dL (ref 30.0–36.0)
MCV: 101.4 fL — ABNORMAL HIGH (ref 80.0–100.0)
Platelets: 266 10*3/uL (ref 150–400)
RBC: 2.8 MIL/uL — ABNORMAL LOW (ref 3.87–5.11)
RDW: 15.9 % — ABNORMAL HIGH (ref 11.5–15.5)
WBC: 5.5 10*3/uL (ref 4.0–10.5)
nRBC: 0 % (ref 0.0–0.2)

## 2018-10-04 LAB — GLUCOSE, CAPILLARY
Glucose-Capillary: 133 mg/dL — ABNORMAL HIGH (ref 70–99)
Glucose-Capillary: 156 mg/dL — ABNORMAL HIGH (ref 70–99)
Glucose-Capillary: 127 mg/dL — ABNORMAL HIGH (ref 70–99)
Glucose-Capillary: 129 mg/dL — ABNORMAL HIGH (ref 70–99)

## 2018-10-04 MED ORDER — SODIUM CHLORIDE 0.9 % IV SOLN
INTRAVENOUS | Status: DC | PRN
  Administered 2018-10-04 – 2018-10-05 (×2): 250 mL via INTRAVENOUS

## 2018-10-04 MED ORDER — SODIUM CHLORIDE 0.9% FLUSH: 10.0000 mL | Freq: Two times a day (BID) | INTRAVENOUS | Status: DC

## 2018-10-04 MED ORDER — SODIUM CHLORIDE 0.9% FLUSH: 10.0000 mL | INTRAVENOUS | Status: DC | PRN

## 2018-10-04 MED ORDER — DIPHENHYDRAMINE HCL 25 MG PO CAPS
25.0000 mg | ORAL_CAPSULE | Freq: Four times a day (QID) | ORAL | Status: DC | PRN
  Administered 2018-10-04 – 2018-10-06 (×3): 25 mg via ORAL
  Filled 2018-10-04 (×3): qty 1

## 2018-10-04 MED ORDER — HYDROMORPHONE HCL 1 MG/ML IJ SOLN
1.0000 mg | Freq: Once | INTRAMUSCULAR | Status: AC
  Administered 2018-10-04: 1 mg via INTRAVENOUS
  Filled 2018-10-04: qty 1

## 2018-10-04 NOTE — Progress Notes (Signed)
Called to room by patient who is having severe upper abdominal pain 10/10.  Patient trying to get out of bed crying in pain.  Dr. Louanne Belton paged.

## 2018-10-04 NOTE — Progress Notes (Signed)
[facility]                                                                               [name], is a 68 y.o. female, DOB - 09-11-1950, SFK:[id number]  Admit date - 10/02/2018   Admitting Physician Hosie Poisson, MD  Outpatient Primary MD for the patient is Tsosie Billing, MD (Inactive)  Outpatient specialists:   LOS - 2  days   Brief summary   [name] is a 68 y.o. female with medical history significant of chronic cholelithiasis, coronary artery disease, asthma, GERD, hypertension, hyperlipidemia, iron deficiency anemia was recently discharged from the hospital (8/13-8/20) after being treated for acute cholecystitis with IV antibiotics and conservative management presented to the hospital again with persistent abdominal pain for 2 to 3 days associated with some nausea and dry heaving.  At that time CA-19-9 44, AFP was normal.  At that time, general surgery was consulted, patient was unable to have PERC drain as gallbladder was contracted.  Patient was felt high risk for injury to CBD or bowel injury to was conservatively managed with IV antibiotics and discharged home with plans to follow-up with Dr. Ninfa Linden in the office in 6 to 8 weeks for interval cholecystectomy.   In ED, patient was afebrile, tachypneic, hypertensive, BP 184/100, creatinine 1.3, alk phos 879, lipase 82, AST 280, ALT 267. CT of the abdomen and pelvis showed cholelithiasis with gallbladder wall thickening representing acute cholecystitis, biliary dilatation measuring up to 14 mm, increased density in CBD may represent sludge versus choledocholithiasis.  Patient was then readmitted to the hospital  Assessment & Plan   Acute cholecystitis with acute choledocholithiasis with elevated LFTs -Recurrent admission for the same. RUQ ultrasound showed 1.7 cm calcification within the gallbladder consistent with large gallstone, with questionable loculated area of perforation.  [name]  intrahepatic biliary duct dilatation.  Surgery and GI was consulted.patient underwent ERCP with findings of sludge and clots and sphincterectomy was done.  Statins on hold due to elevated LFT.  Continue Zosyn.  Follow surgical recommendation.  Trending down LFTs.  Clinically appears to be better today.  Anemia of chronic disease Hemoglobin stable.  Repeat CBC in a.m.  Essential hypertension Overall blood pressure control is adequate.  Continue Norvasc, metoprolol   chronic CKD stage III  -Baseline creatinine ~1.5.  Will closely monitor BMP.  Check BMP in a.m.  History of asthma -Currently stable, no wheezing.  Oxygen bronchodilators as needed.  Diabetes mellitus type 2, IDDM - Lantus on hold., continue sliding scale insulin.  It is glucose level of 118  CAD status post stent in 2013 No acute issues.  Hold Lipitor due to elevated LFTs.  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 Payton Mccallum, on letrozole and Herceptin, on hold due to acute illness  Code Status: Full CODE  DVT Prophylaxis:  Lovenox  Family Communication: None today  Disposition Plan: On clears.  Status post ERCP.  Follow GI and surgical team recommendations on further treatment.   Procedures:  Right upper quadrant ultrasound ERCP on 8/28 with sphincterectomy  CT scan of the abdomen and pelvis HIDA scan  Consultants:   General surgery Gastroenterology  Antimicrobials:   Zosyn 10/02/2018>  Anti-infectives (From admission, onward)   Start     Dose/Rate Route Frequency Ordered Stop   10/02/18 2000  piperacillin-tazobactam (ZOSYN) IVPB 3.375 g     3.375 g 12.5 mL/hr over 240 Minutes Intravenous Every 8 hours 10/02/18 1809     10/02/18 1030  cefTRIAXone (ROCEPHIN) 2 g in sodium chloride 0.9 % 100 mL IVPB     2 g 200 mL/hr over 30 Minutes Intravenous  Once 10/02/18 1026 10/02/18 1130   10/02/18 1030  metroNIDAZOLE  (FLAGYL) IVPB 500 mg     500 mg 100 mL/hr over 60 Minutes Intravenous  Once 10/02/18 1026 10/02/18 1235       Subjective:   Patient feels better today.  Denies any nausea, vomiting, abdominal pain, fever or chills.   Objective:   Vitals:   10/03/18 2036 10/03/18 2131 10/04/18 0532 10/04/18 0746  BP:  (!) 121/50 114/62   Pulse:  60 60   Resp:  20 18   Temp:  98.3 F (36.8 C) 98 F (36.7 C)   TempSrc:  Oral Oral   SpO2: 98% 95% 99% 96%  Weight:      Height:        Intake/Output Summary (Last 24 hours) at 10/04/2018 0940 Last data filed at 10/04/2018 0905 Gross per 24 hour  Intake 1205.13 ml  Output 900 ml  Net 305.13 ml     Wt Readings from Last 3 Encounters:  10/03/18 56.7 kg  09/30/18 58.2 kg  09/25/18 58.6 kg    Physical exam  General:  Average built, not in obvious distress  HENT: Normocephalic, pupils equally reacting to light and accommodation.  No scleral pallor or icterus noted. Oral mucosa is moist.   Chest:  Clear breath sounds.  Diminished breath sounds bilaterally. No crackles or wheezes.  Left chest wall Port-A-Cath in place.  CVS: S1 &S2 heard. No murmur.  Regular rate and rhythm.  Abdomen: Soft, nontender, nondistended.  Bowel sounds are heard.  Liver is not palpable, no abdominal mass palpated  Extremities: No cyanosis, clubbing or edema.  Peripheral pulses are palpable.  Psych: Alert, awake and oriented, normal mood  CNS:  No cranial nerve deficits.  Power equal in all extremities.  No sensory deficits noted.  No cerebellar signs.    Skin: Warm and dry.  No rashes noted.   Data Reviewed:  I have personally reviewed following labs and imaging studies  Micro Results Recent Results (from the past 240 hour(s))  SARS CORONAVIRUS 2 (TAT 6-12 HRS) Nasal Swab Aptima Multi Swab     Status: None   Collection Time: 10/02/18  2:34 PM   Specimen: Aptima Multi Swab; Nasal Swab  Result Value Ref Range Status   SARS Coronavirus 2 NEGATIVE NEGATIVE  Final    Comment: (NOTE) SARS-CoV-2 target nucleic acids are NOT DETECTED. The SARS-CoV-2 RNA is generally detectable in upper and lower respiratory specimens during the acute phase of infection. Negative results do not preclude SARS-CoV-2 infection, do not rule out co-infections with other pathogens, and should not be used as the sole basis for treatment or other patient management decisions. Negative results must be combined with clinical observations, patient history, and epidemiological information. The expected result is Negative. Fact Sheet for Patients: SugarRoll.be Fact Sheet for Healthcare Providers: https://www.woods-mathews.com/ This test is not yet approved or cleared by the [place] FDA and  has been authorized for detection and/or diagnosis of SARS-CoV-2 by FDA under an Emergency Use Authorization (EUA). This EUA will remain  in effect (meaning this test can be used) for the duration of the COVID-19 declaration under Section 56 4(b)(1) of the Act, 21 U.S.C. section 360bbb-3(b)(1), unless the authorization is terminated or revoked sooner. Performed at [facility], [place] [address], [place], [place] [address]     Radiology Reports Nm Hepatobiliary Liver Func  Result Date: 09/19/2018 CLINICAL DATA:  Abdominal pain, nausea and vomiting. Elevated bilirubin level. EXAM: NUCLEAR MEDICINE HEPATOBILIARY IMAGING TECHNIQUE: Sequential images of the abdomen were obtained out to 60 minutes following intravenous administration of radiopharmaceutical. RADIOPHARMACEUTICALS:  7.1 mCi Tc-103m Choletec IV COMPARISON:  MRI 09/18/2018 and CT AP 09/18/2018 FINDINGS: Prompt uptake and biliary excretion of activity by the liver is seen. Biliary activity passes into small bowel, consistent with patent common bile duct. After 1 hour of imaging no gallbladder activity identified. The patient was then given 2.4 mg of morphine sulfate, IV. Imaging was  then subsequently performed for an additional 30 minutes without visualization of the gallbladder. IMPRESSION: 1. Nonvisualization of the gallbladder compatible with cystic duct obstruction which may be secondary to acute cholecystitis. 2. Patent common bile duct. Electronically Signed   By: TKerby MoorsM.D.   On: 09/19/2018 15:25   Ct Abdomen Pelvis W Contrast  Result Date: 10/02/2018 CLINICAL DATA:  68year old female with acute abdominal and pelvic pain and nausea. EXAM: CT ABDOMEN AND PELVIS WITH CONTRAST TECHNIQUE: Multidetector CT imaging of the abdomen and pelvis was performed using the standard protocol following bolus administration of intravenous contrast. CONTRAST:  871mOMNIPAQUE IOHEXOL 300 MG/ML  SOLN COMPARISON:  09/18/2018 CT FINDINGS: Lower chest: Bibasilar atelectasis is identified. Cardiomegaly again noted. Hepatobiliary: Cholelithiasis with gallbladder wall thickening again noted. There is intrahepatic and extrahepatic biliary dilatation to the ampulla with the CBD measuring up to 14 mm in greatest diameter. [name] increased density within the visualized CBD is noted and may represent sludge versus choledocholithiasis. Pancreas: Unremarkable Spleen: Unremarkable Adrenals/Urinary Tract: The kidneys, adrenal glands and bladder are unremarkable except for unchanged bilateral renal cortical thinning and probable tiny bilateral renal cysts. There is no evidence of hydronephrosis or obstructing urinary calculi. Stomach/Bowel: Stomach is within normal limits. Surgical changes along the RIGHT colon again noted. No evidence of bowel obstruction. No evidence of bowel wall thickening, distention, or inflammatory changes. Vascular/Lymphatic: Aortic atherosclerosis. No enlarged abdominal or pelvic lymph nodes. Reproductive: Unremarkable except for a stable calcified RIGHT uterine fibroid. Other: No ascites, focal collection or pneumoperitoneum. Small bilateral inguinal hernias containing fat are again  noted. Musculoskeletal: No acute or suspicious bony abnormality. IMPRESSION: 1. Cholelithiasis with gallbladder wall thickening likely representing acute cholecystitis. Intrahepatic and extrahepatic biliary dilatation to the ampulla with the CBD now measuring 14 mm. Increased density within the CBD may represent sludge versus choledocholithiasis. 2. Cardiomegaly and bibasilar atelectasis. 3. Unchanged small bilateral inguinal hernias containing fat. 4.  Aortic Atherosclerosis (ICD10-I70.0). Electronically Signed   By: JeMargarette Canada.D.   On: 10/02/2018 14:21   Ct Abdomen Pelvis W Contrast  Result Date: 09/18/2018 CLINICAL DATA:  Abdominal pain and distension. History of breast carcinoma EXAM: CT ABDOMEN AND PELVIS WITH CONTRAST TECHNIQUE: Multidetector CT imaging of the abdomen and pelvis was performed using the standard protocol following bolus administration of intravenous contrast. CONTRAST:  7538mMNIPAQUE IOHEXOL 300 MG/ML  SOLN COMPARISON:  December 18, 2017 FINDINGS: Lower chest: There is bibasilar atelectasis. There is  mild bronchiectatic change in the lung bases as well. There are foci of coronary artery calcification. Hepatobiliary: There is hepatic steatosis. No focal liver lesions are evident. There is cholelithiasis. The gallbladder wall is diffusely thickened and edematous. There is loss of fat plane between the gallbladder and the pylorus/first portion of the duodenum. There is no air within the gallbladder. There is no appreciable intrahepatic biliary duct dilatation. There is dilatation of the distal common bile duct, measuring 1.2 cm. No biliary duct mass or calculus evident. Pancreas: No pancreatic mass or inflammatory focus evident. No pancreatic duct dilatation. Spleen: No splenic lesions are evident. Adrenals/Urinary Tract: Adrenals bilaterally appear normal. There are subcentimeter cysts in the right kidney. There is an extrarenal pelvis on the right, an anatomic variant. There is no  hydronephrosis on either side. There is or ureteral calculus on either side. Urinary bladder wall thickness is within normal limits. Stomach/Bowel: As noted above, there is loss of fat plane between the gallbladder and the pylorus/first portion of the duodenum. There is wall thickening in the distal stomach and proximal duodenum. There is no other appreciable bowel wall thickening. The patient has had a partial right colectomy with anastomosis patent in this area. Terminal ileum appears unremarkable. No evident bowel obstruction. There is no free air or portal venous air. Vascular/Lymphatic: There is aortic and iliac artery atherosclerosis. No aneurysm evident. Major mesenteric arterial vessels appear patent, although there is severe narrowing at the origin of the right renal artery and extensive calcification with severe narrowing in the proximal left renal artery. No adenopathy is evident in the abdomen or pelvis. Reproductive: There are calcifications in the uterus, felt to be indicative of leiomyomatous change. The uterus is slightly canted toward the right. No pelvic mass is demonstrable. Other: Appendix absent. There is no abscess or ascites evident in the abdomen or pelvis. There is fat in each inguinal ring. Musculoskeletal: There are no blastic or lytic bone lesions. There is bony hypertrophy at L4 and diffuse disc protrusion. These findings in concert leads spinal stenosis. Similar changes are noted at L3-4 with spinal stenosis also present at L3-4. No blastic or lytic bone lesions. No intramuscular lesions are evident. IMPRESSION: 1. There is cholelithiasis with diffuse gallbladder wall thickening and edema. There is loss of fat plane between the gallbladder and the distal stomach/proximal duodenum. 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 No air seen in the gallbladder. 2. There is dilatation of the common hepatic and common bile ducts. No intrahepatic biliary duct dilatation seen.  No biliary duct mass or calculus evident. A mass at the ampulla could cause these findings. From an imaging standpoint, MRCP would be the optimum study of choice to further evaluate in this regard. 3.  Hepatic steatosis.  No focal liver lesions appreciable. 4. No bowel obstruction. No abscess in the abdomen or pelvis. The patient is status post partial right colectomy with anastomosis patent. 5. Spinal stenosis at L3-4 and L4-5 due to disc protrusions and bony hypertrophy at these levels. 6. Extensive calcification and narrowing in the proximal renal arteries bilaterally. Question whether patient is hypertensive; there may well be renovascular hypertension given this appearance. 7.  Extensive aortic and iliac artery atherosclerosis. 8. Calcifications in the uterus consistent with leiomyomatous change. Electronically Signed   By: Lowella Grip III M.D.   On: 09/18/2018 14:26   Mr Abdomen Mrcp Wo Contrast  Result Date: 09/18/2018 CLINICAL DATA:  Abdominal pain/distension, Hemoccult-positive, history of breast cancer status post right mastectomy EXAM: MRI  ABDOMEN WITHOUT CONTRAST  (INCLUDING MRCP) TECHNIQUE: Multiplanar multisequence MR imaging of the abdomen was performed. Heavily T2-weighted images of the biliary and pancreatic ducts were obtained, and three-dimensional MRCP images were rendered by post processing. COMPARISON:  CT abdomen/pelvis dated 09/18/2018 FINDINGS: Motion degraded images. Lower chest: Lung bases are clear. Hepatobiliary: Suspected iron deposition in the liver. No focal hepatic lesion is seen. Cholelithiasis with gallbladder wall thickening/inflammatory changes. Associated 1.9 x 2.8 cm cystic lesion along the wall of the gallbladder (series 8/image 26) favors localized perforation. No intrahepatic or extrahepatic ductal dilatation. Pancreas:  Within normal limits. Spleen:  Within normal limits. Adrenals/Urinary Tract:  Adrenal glands are within normal limits. 8 mm posterior right upper  pole renal cyst with mild right renal cortical scarring. Left kidney is within normal limits. No hydronephrosis. Stomach/Bowel: Stomach is within normal limits. Proximal duodenum is mildly thick-walled and adherent to the gallbladder (series 8/image 27). While direct fistulous communication is not demonstrated on MR, this may reflect involvement of the inflammatory process. Visualized bowel is otherwise unremarkable. Vascular/Lymphatic:  No evidence of abdominal aortic aneurysm. No suspicious abdominal lymphadenopathy. Other:  No abdominal ascites. Musculoskeletal: No focal osseous lesions. IMPRESSION: Cholelithiasis with acute cholecystitis. Associated 2.8 cm cystic lesion along the wall the gallbladder favors localized perforation. Adherent proximal duodenum with mild wall thickening suggest involvement with the right upper quadrant inflammatory process. Direct fistulous communication is not demonstrated on MR. Additional ancillary findings as above. Electronically Signed   By: Julian Hy M.D.   On: 09/18/2018 22:04   Dg Ercp Biliary & Pancreatic Ducts  Result Date: 10/03/2018 CLINICAL DATA:  Choledocholithiasis, biliary dilatation EXAM: ERCP with sphincterotomy and balloon extraction TECHNIQUE: Multiple spot images obtained with the fluoroscopic device and submitted for interpretation post-procedure. FLUOROSCOPY TIME:  Fluoroscopy Time:  7 minutes 5 seconds Radiation Exposure Index (if provided by the fluoroscopic device): 127.74 COMPARISON:  10/02/2018 CT FINDINGS: Intraoperative cholangiogram during the ERCP demonstrate biliary dilatation and filling defects throughout the common bile duct without obstruction. Guidewire access obtained. Balloon extraction performed for removal of the duct stones, debris and/or sludge. Please refer to the ERCP for details of the procedure. IMPRESSION: Limited imaging during ERCP with sphincterotomy and balloon extraction to remove sludge and clots from the common bile  duct as described in the ERCP report. These images were submitted for radiologic interpretation only. Please see the procedural report for the amount of contrast and the fluoroscopy time utilized. Electronically Signed   By: Jerilynn Mages.  Shick M.D.   On: 10/03/2018 16:09   Dg Abdomen Acute W/chest  Result Date: 09/18/2018 CLINICAL DATA:  Acute generalized abdominal pain. EXAM: DG ABDOMEN ACUTE W/ 1V CHEST COMPARISON:  Radiographs of April 10, 2018 and February 27, 2015. FINDINGS: There is no evidence of dilated bowel loops or free intraperitoneal air. No radiopaque calculi or other significant radiographic abnormality is seen. Stable cardiomediastinal silhouette is noted. Atherosclerosis of thoracic aorta is noted. Left internal jugular Port-A-Cath is noted in grossly good position. Both lungs are clear. IMPRESSION: No evidence of bowel obstruction or ileus. No acute cardiopulmonary disease. Aortic Atherosclerosis (ICD10-I70.0). Electronically Signed   By: Marijo Conception M.D.   On: 09/18/2018 13:24   US Abdomen Limited Ruq  Result Date: 10/02/2018 CLINICAL DATA:  Severe abdominal pain EXAM: ULTRASOUND ABDOMEN LIMITED RIGHT UPPER QUADRANT COMPARISON:  Ultrasound right upper quadrant September 19, 2018; CT abdomen and pelvis September 18 2018 and abdominal MRI September 18, 2018 FINDINGS: Gallbladder: There is a 1.7 cm calcification within  the gallbladder consistent with a large gallstone. The gallbladder wall remains thickened edematous with localized fluid which may represent loculated perforation of the gallbladder. Patient is focally tender over the gallbladder. Common bile duct: Diameter: 7 mm. There is [name] intrahepatic biliary duct dilatation. Liver: No focal lesion identified. Within normal limits in parenchymal echogenicity. Portal vein is patent on color Doppler imaging with normal direction of blood flow towards the liver. Other: None. IMPRESSION: Findings in the gallbladder indicative of acute cholecystitis with  questionable loculated area of perforation. Common bile duct upper normal in size. [name] degree of intrahepatic biliary duct dilatation felt to be present. Electronically Signed   By: Lowella Grip III M.D.   On: 10/02/2018 11:38   US Abdomen Limited Ruq  Result Date: 09/19/2018 CLINICAL DATA:  Cholecystitis EXAM: ULTRASOUND ABDOMEN LIMITED RIGHT UPPER QUADRANT COMPARISON:  MRI September 18, 2018, CT September 18, 2018. FINDINGS: Gallbladder: There is a large echogenic mass seen on the peripheral outer wall of the gallbladder with posterior shadowing, likely calcified mass as seen on prior MRI and CT exam. This measures 1.4 by 1.5 x 1.2 cm. There is diffuse gallbladder wall thickening measuring 6 mm. No definite pericholecystic fluid is seen. No sonographic Percell Miller sign is seen. Common bile duct: Diameter: 4 mm. Liver: No focal lesion identified. Within normal limits in parenchymal echogenicity. Portal vein is patent on color Doppler imaging with normal direction of blood flow towards the liver. Other: None. IMPRESSION: 1. Large calcified mass seen on the peripheral outer wall of the gallbladder measuring 1.4 x 1.5 x 1.2 cm with diffuse thickened gallbladder wall. Findings suggestive of cholecystitis. 2. Normal appearing liver. 3. No extrahepatic biliary ductal dilatation. Electronically Signed   By: Prudencio Pair M.D.   On: 09/19/2018 18:11    Lab Data:  CBC: Recent Labs  Lab 10/02/18 1020  WBC 6.8  HGB 10.6*  HCT 33.6*  MCV 100.0  PLT 161   Basic Metabolic Panel: Recent Labs  Lab 10/02/18 1020 10/03/18 1714  NA 140 139  K 3.4* 3.8  CL 110 106  CO2 20* 23  GLUCOSE 115* 118*  BUN 15 24*  CREATININE 1.37* 1.72*  CALCIUM 8.4* 8.5*   GFR: Estimated Creatinine Clearance: 23.4 mL/min (A) (by C-G formula based on SCr of 1.72 mg/dL (H)). Liver Function Tests: Recent Labs  Lab 10/02/18 1020 10/03/18 1714  AST 280* 186*  ALT 267* 242*  ALKPHOS 879* 909*  BILITOT 3.7* 3.2*  PROT 7.1  7.4  ALBUMIN 3.2* 3.0*   Recent Labs  Lab 10/02/18 1020  LIPASE 82*   No results for input(s): AMMONIA in the last 168 hours. Coagulation Profile: Recent Labs  Lab 10/03/18 1714  INR 1.0   Cardiac Enzymes: No results for input(s): CKTOTAL, CKMB, CKMBINDEX, TROPONINI in the last 168 hours. BNP (last 3 results) No results for input(s): PROBNP in the last 8760 hours. HbA1C: No results for input(s): HGBA1C in the last 72 hours. CBG: Recent Labs  Lab 10/03/18 0729 10/03/18 1145 10/03/18 1624 10/03/18 2147 10/04/18 0724  GLUCAP 82 92 104* 169* 133*   Lipid Profile: No results for input(s): CHOL, HDL, LDLCALC, TRIG, CHOLHDL, LDLDIRECT in the last 72 hours. Thyroid Function Tests: No results for input(s): TSH, T4TOTAL, FREET4, T3FREE, THYROIDAB in the last 72 hours. Anemia Panel: No results for input(s): VITAMINB12, FOLATE, FERRITIN, TIBC, IRON, RETICCTPCT in the last 72 hours. Urine analysis:    Component Value Date/Time   COLORURINE YELLOW 11/27/2015 [place]  CLOUDY (A) 11/27/2015 0743   LABSPEC 1.019 11/27/2015 0743   PHURINE 6.0 11/27/2015 0743   GLUCOSEU NEGATIVE 11/27/2015 0743   HGBUR NEGATIVE 11/27/2015 0743   BILIRUBINUR NEGATIVE 11/27/2015 0743   KETONESUR NEGATIVE 11/27/2015 0743   PROTEINUR NEGATIVE 11/27/2015 0743   UROBILINOGEN 0.2 03/31/2014 2017   NITRITE NEGATIVE 11/27/2015 0743   LEUKOCYTESUR MODERATE (A) 11/27/2015 0743   Medications  Scheduled Meds: . ALPRAZolam  0.25 mg Oral QHS  . amLODipine  5 mg Oral Daily  . enoxaparin (LOVENOX) injection  30 mg Subcutaneous Q24H  . indomethacin  100 mg Rectal Once  . insulin aspart  0-5 Units Subcutaneous QHS  . insulin aspart  0-9 Units Subcutaneous TID WC  . metoprolol succinate  25 mg Oral Daily  . mometasone-formoterol  2 puff Inhalation BID   Continuous Infusions: . sodium chloride 75 mL/hr at 10/04/18 0600  . sodium chloride Stopped (10/04/18 0344)  . piperacillin-tazobactam  (ZOSYN)  IV 12.5 mL/hr at 10/04/18 0600   PRN Meds:.sodium chloride, acetaminophen, albuterol, hydrALAZINE, HYDROmorphone (DILAUDID) injection, nitroGLYCERIN, ondansetron (ZOFRAN) IV   Flora Lipps M.D. [facility] 10/04/2018, 9:40 AM

## 2018-10-04 NOTE — Progress Notes (Addendum)
Progress Note   Subjective  Abdominal pain resolved.    Objective  Vital signs in last 24 hours: Temp:  [98 F (36.7 C)-99.2 F (37.3 C)] 98 F (36.7 C) (08/29 0532) Pulse Rate:  [53-72] 60 (08/29 0532) Resp:  [12-21] 18 (08/29 0532) BP: (99-156)/(50-78) 114/62 (08/29 0532) SpO2:  [93 %-100 %] 96 % (08/29 0746) Weight:  [56.7 kg] 56.7 kg (08/28 1404) Last BM Date: 10/01/18  General: Alert, well-developed, in NAD Heart:  Regular rate and rhythm; no murmurs Chest: Clear to ascultation bilaterally Abdomen:  Soft, nontender and nondistended. Normal bowel sounds, without guarding, and without rebound.   Extremities:  Without edema. Neurologic:  Alert and  oriented x4; grossly normal neurologically. Psych:  Alert and cooperative. Normal mood and affect.  Intake/Output from previous day: 08/28 0701 - 08/29 0700 In: 1205.1 [I.V.:1060.4; IV Piggyback:144.7] Out: 900 [Urine:900] Intake/Output this shift: No intake/output data recorded.  Lab Results: Recent Labs    10/02/18 1020  WBC 6.8  HGB 10.6*  HCT 33.6*  PLT 363   BMET Recent Labs    10/02/18 1020 10/03/18 1714  NA 140 139  K 3.4* 3.8  CL 110 106  CO2 20* 23  GLUCOSE 115* 118*  BUN 15 24*  CREATININE 1.37* 1.72*  CALCIUM 8.4* 8.5*   LFT Recent Labs    10/03/18 1714  PROT 7.4  ALBUMIN 3.0*  AST 186*  ALT 242*  ALKPHOS 909*  BILITOT 3.2*   PT/INR Recent Labs    10/03/18 1714  LABPROT 13.2  INR 1.0    Studies/Results: Ct Abdomen Pelvis W Contrast  Result Date: 10/02/2018 CLINICAL DATA:  68 year old female with acute abdominal and pelvic pain and nausea. EXAM: CT ABDOMEN AND PELVIS WITH CONTRAST TECHNIQUE: Multidetector CT imaging of the abdomen and pelvis was performed using the standard protocol following bolus administration of intravenous contrast. CONTRAST:  37mL OMNIPAQUE IOHEXOL 300 MG/ML  SOLN COMPARISON:  09/18/2018 CT FINDINGS: Lower chest: Bibasilar atelectasis is identified.  Cardiomegaly again noted. Hepatobiliary: Cholelithiasis with gallbladder wall thickening again noted. There is intrahepatic and extrahepatic biliary dilatation to the ampulla with the CBD measuring up to 14 mm in greatest diameter. Slight increased density within the visualized CBD is noted and may represent sludge versus choledocholithiasis. Pancreas: Unremarkable Spleen: Unremarkable Adrenals/Urinary Tract: The kidneys, adrenal glands and bladder are unremarkable except for unchanged bilateral renal cortical thinning and probable tiny bilateral renal cysts. There is no evidence of hydronephrosis or obstructing urinary calculi. Stomach/Bowel: Stomach is within normal limits. Surgical changes along the RIGHT colon again noted. No evidence of bowel obstruction. No evidence of bowel wall thickening, distention, or inflammatory changes. Vascular/Lymphatic: Aortic atherosclerosis. No enlarged abdominal or pelvic lymph nodes. Reproductive: Unremarkable except for a stable calcified RIGHT uterine fibroid. Other: No ascites, focal collection or pneumoperitoneum. Small bilateral inguinal hernias containing fat are again noted. Musculoskeletal: No acute or suspicious bony abnormality. IMPRESSION: 1. Cholelithiasis with gallbladder wall thickening likely representing acute cholecystitis. Intrahepatic and extrahepatic biliary dilatation to the ampulla with the CBD now measuring 14 mm. Increased density within the CBD may represent sludge versus choledocholithiasis. 2. Cardiomegaly and bibasilar atelectasis. 3. Unchanged small bilateral inguinal hernias containing fat. 4.  Aortic Atherosclerosis (ICD10-I70.0). Electronically Signed   By: Margarette Canada M.D.   On: 10/02/2018 14:21   Dg Ercp Biliary & Pancreatic Ducts  Result Date: 10/03/2018 CLINICAL DATA:  Choledocholithiasis, biliary dilatation EXAM: ERCP with sphincterotomy and balloon extraction TECHNIQUE: Multiple spot images obtained with the  fluoroscopic device and  submitted for interpretation post-procedure. FLUOROSCOPY TIME:  Fluoroscopy Time:  7 minutes 5 seconds Radiation Exposure Index (if provided by the fluoroscopic device): 127.74 COMPARISON:  10/02/2018 CT FINDINGS: Intraoperative cholangiogram during the ERCP demonstrate biliary dilatation and filling defects throughout the common bile duct without obstruction. Guidewire access obtained. Balloon extraction performed for removal of the duct stones, debris and/or sludge. Please refer to the ERCP for details of the procedure. IMPRESSION: Limited imaging during ERCP with sphincterotomy and balloon extraction to remove sludge and clots from the common bile duct as described in the ERCP report. These images were submitted for radiologic interpretation only. Please see the procedural report for the amount of contrast and the fluoroscopy time utilized. Electronically Signed   By: Jerilynn Mages.  Shick M.D.   On: 10/03/2018 16:09   US Abdomen Limited Ruq  Result Date: 10/02/2018 CLINICAL DATA:  Severe abdominal pain EXAM: ULTRASOUND ABDOMEN LIMITED RIGHT UPPER QUADRANT COMPARISON:  Ultrasound right upper quadrant September 19, 2018; CT abdomen and pelvis September 18 2018 and abdominal MRI September 18, 2018 FINDINGS: Gallbladder: There is a 1.7 cm calcification within the gallbladder consistent with a large gallstone. The gallbladder wall remains thickened edematous with localized fluid which may represent loculated perforation of the gallbladder. Patient is focally tender over the gallbladder. Common bile duct: Diameter: 7 mm. There is slight intrahepatic biliary duct dilatation. Liver: No focal lesion identified. Within normal limits in parenchymal echogenicity. Portal vein is patent on color Doppler imaging with normal direction of blood flow towards the liver. Other: None. IMPRESSION: Findings in the gallbladder indicative of acute cholecystitis with questionable loculated area of perforation. Common bile duct upper normal in size.  Slight degree of intrahepatic biliary duct dilatation felt to be present. Electronically Signed   By: Lowella Grip III M.D.   On: 10/02/2018 11:38      Assessment & Recommendations   1. Biliary obstruction with blood clots and sludge removed at ERCP yesterday. Acute symptoms have resolved. Cause of hemobilia is presumably related to cholecystitis, cholelithiasis. Today's CMP is pending. Advance diet as tolerated. Anticipate LFTs gradually improving over a few weeks. Outpatient follow up with Dr. Carlean Purl. No additional GI recommendations at this time.GI signing off.    2. Cholecystitis, cholelithiasis. Recent localized GB perforation. Continue IV Zosyn. Surgery following for further mgmt.     LOS: 2 days   Norberto Sorenson T. Fuller Plan MD 10/04/2018, 9:58 AM

## 2018-10-04 NOTE — Progress Notes (Signed)
[facility]:  [phone] General Surgery Progress Note   LOS: 2 days  POD -  1 Day Post-Op  Chief Complaint: Abdominal pain  Assessment and Plan: 1.  Choledocholithiasis, hemobilia  ENDOSCOPIC RETROGRADE CHOLANGIOPANCREATOGRAPHY (ERCP), SPHINCTEROTOMY - 10/03/2018 - Fuller Plan  This appears to have been both diagnostic and therapeutic - she is without pain today.  2.  Cholecystitis/cholelithiasis with possible GB perforation  The patient has been thought to be very high risk for surgical complication with cholecystectomy.  On Zosyn  WBC - 5,500 - 10/04/2018  Will advance to full liquids.  For now, would consider antibiotics as plan of treatment for gall bladder 3.  CAD 4.  Hx of MI with stenting 5.  HTN 6.  IDDM 7.  CKD  Creatinine - 1.72 - 10/04/2018 8.  Right colectomy in 2009 - post polypectomy colonic perforation 9.  DVT prophylaxis - Lovenox 10.  Anemia   Hgb - 8.7 - 10/04/2018 11.  History of right breast cancer - Her2Neu positive Right mastectomy - 11/27/2017 - [facility] On herceptin by Dr. Lindi Adie   Active Problems:   Choledocholithiasis   Dilated bile duct   Jaundice   Elevated LFTs   Hemobilia  Subjective:  Feels good.  Wants more to eat.  No abdominal complaint.  I talked to her sister, Princess Perna.  But her sister lives in [place], so she cannot help her when she goes home.   Ms. Finamore lives with her son and grandson. Her grandson comes by every day.  Objective:   Vitals:   10/04/18 1045 10/04/18 1046  BP: (!) 156/73 (!) 156/73  Pulse:  64  Resp:    Temp:    SpO2:  98%     Intake/Output from previous day:  08/28 0701 - 08/29 0700 In: 1205.1 [I.V.:1060.4; IV Piggyback:144.7] Out: 900 [Urine:900]  Intake/Output this shift:  No intake/output data recorded.   Physical Exam:   General: WN AA F who is alert and oriented.   She is comfortable.  Has no pain.  She wants more to eat.   HEENT: Normal. Pupils  equal. .   Lungs: Clear   Abdomen: Distended.  Has BS.   Lab Results:    Recent Labs    10/02/18 1020 10/04/18 1001  WBC 6.8 5.5  HGB 10.6* 8.7*  HCT 33.6* 28.4*  PLT 363 266    BMET   Recent Labs    10/02/18 1020 10/03/18 1714  NA 140 139  K 3.4* 3.8  CL 110 106  CO2 20* 23  GLUCOSE 115* 118*  BUN 15 24*  CREATININE 1.37* 1.72*  CALCIUM 8.4* 8.5*    PT/INR   Recent Labs    10/03/18 1714  LABPROT 13.2  INR 1.0    ABG  No results for input(s): PHART, HCO3 in the last 72 hours.  Invalid input(s): PCO2, PO2   Studies/Results:  Ct Abdomen Pelvis W Contrast  Result Date: 10/02/2018 CLINICAL DATA:  68 year old female with acute abdominal and pelvic pain and nausea. EXAM: CT ABDOMEN AND PELVIS WITH CONTRAST TECHNIQUE: Multidetector CT imaging of the abdomen and pelvis was performed using the standard protocol following bolus administration of intravenous contrast. CONTRAST:  70mL OMNIPAQUE IOHEXOL 300 MG/ML  SOLN COMPARISON:  09/18/2018 CT FINDINGS: Lower chest: Bibasilar atelectasis is identified. Cardiomegaly again noted. Hepatobiliary: Cholelithiasis with gallbladder wall thickening again noted. There is intrahepatic and extrahepatic biliary dilatation to the ampulla with the CBD measuring up to 14 mm in greatest diameter.  Slight increased density within the visualized CBD is noted and may represent sludge versus choledocholithiasis. Pancreas: Unremarkable Spleen: Unremarkable Adrenals/Urinary Tract: The kidneys, adrenal glands and bladder are unremarkable except for unchanged bilateral renal cortical thinning and probable tiny bilateral renal cysts. There is no evidence of hydronephrosis or obstructing urinary calculi. Stomach/Bowel: Stomach is within normal limits. Surgical changes along the RIGHT colon again noted. No evidence of bowel obstruction. No evidence of bowel wall thickening, distention, or inflammatory changes. Vascular/Lymphatic: Aortic atherosclerosis. No  enlarged abdominal or pelvic lymph nodes. Reproductive: Unremarkable except for a stable calcified RIGHT uterine fibroid. Other: No ascites, focal collection or pneumoperitoneum. Small bilateral inguinal hernias containing fat are again noted. Musculoskeletal: No acute or suspicious bony abnormality. IMPRESSION: 1. Cholelithiasis with gallbladder wall thickening likely representing acute cholecystitis. Intrahepatic and extrahepatic biliary dilatation to the ampulla with the CBD now measuring 14 mm. Increased density within the CBD may represent sludge versus choledocholithiasis. 2. Cardiomegaly and bibasilar atelectasis. 3. Unchanged small bilateral inguinal hernias containing fat. 4.  Aortic Atherosclerosis (ICD10-I70.0). Electronically Signed   By: Margarette Canada M.D.   On: 10/02/2018 14:21   Dg Ercp Biliary & Pancreatic Ducts  Result Date: 10/03/2018 CLINICAL DATA:  Choledocholithiasis, biliary dilatation EXAM: ERCP with sphincterotomy and balloon extraction TECHNIQUE: Multiple spot images obtained with the fluoroscopic device and submitted for interpretation post-procedure. FLUOROSCOPY TIME:  Fluoroscopy Time:  7 minutes 5 seconds Radiation Exposure Index (if provided by the fluoroscopic device): 127.74 COMPARISON:  10/02/2018 CT FINDINGS: Intraoperative cholangiogram during the ERCP demonstrate biliary dilatation and filling defects throughout the common bile duct without obstruction. Guidewire access obtained. Balloon extraction performed for removal of the duct stones, debris and/or sludge. Please refer to the ERCP for details of the procedure. IMPRESSION: Limited imaging during ERCP with sphincterotomy and balloon extraction to remove sludge and clots from the common bile duct as described in the ERCP report. These images were submitted for radiologic interpretation only. Please see the procedural report for the amount of contrast and the fluoroscopy time utilized. Electronically Signed   By: Jerilynn Mages.  Shick M.D.    On: 10/03/2018 16:09   US Abdomen Limited Ruq  Result Date: 10/02/2018 CLINICAL DATA:  Severe abdominal pain EXAM: ULTRASOUND ABDOMEN LIMITED RIGHT UPPER QUADRANT COMPARISON:  Ultrasound right upper quadrant September 19, 2018; CT abdomen and pelvis September 18 2018 and abdominal MRI September 18, 2018 FINDINGS: Gallbladder: There is a 1.7 cm calcification within the gallbladder consistent with a large gallstone. The gallbladder wall remains thickened edematous with localized fluid which may represent loculated perforation of the gallbladder. Patient is focally tender over the gallbladder. Common bile duct: Diameter: 7 mm. There is slight intrahepatic biliary duct dilatation. Liver: No focal lesion identified. Within normal limits in parenchymal echogenicity. Portal vein is patent on color Doppler imaging with normal direction of blood flow towards the liver. Other: None. IMPRESSION: Findings in the gallbladder indicative of acute cholecystitis with questionable loculated area of perforation. Common bile duct upper normal in size. Slight degree of intrahepatic biliary duct dilatation felt to be present. Electronically Signed   By: Lowella Grip III M.D.   On: 10/02/2018 11:38     Anti-infectives:   Anti-infectives (From admission, onward)   Start     Dose/Rate Route Frequency Ordered Stop   10/02/18 2000  piperacillin-tazobactam (ZOSYN) IVPB 3.375 g     3.375 g 12.5 mL/hr over 240 Minutes Intravenous Every 8 hours 10/02/18 1809     10/02/18 1030  cefTRIAXone (ROCEPHIN) 2  g in sodium chloride 0.9 % 100 mL IVPB     2 g 200 mL/hr over 30 Minutes Intravenous  Once 10/02/18 1026 10/02/18 1130   10/02/18 1030  metroNIDAZOLE (FLAGYL) IVPB 500 mg     500 mg 100 mL/hr over 60 Minutes Intravenous  Once 10/02/18 1026 10/02/18 1235      Alphonsa Overall, MD, FACS Pager: [facility]: [phone] 10/04/2018

## 2018-10-05 LAB — CBC
HCT: 24 % — ABNORMAL LOW (ref 36.0–46.0)
Hemoglobin: 7.2 g/dL — ABNORMAL LOW (ref 12.0–15.0)
MCH: 30.9 pg (ref 26.0–34.0)
MCHC: 30 g/dL (ref 30.0–36.0)
MCV: 103 fL — ABNORMAL HIGH (ref 80.0–100.0)
Platelets: 234 10*3/uL (ref 150–400)
RBC: 2.33 MIL/uL — ABNORMAL LOW (ref 3.87–5.11)
RDW: 16.2 % — ABNORMAL HIGH (ref 11.5–15.5)
WBC: 4.1 10*3/uL (ref 4.0–10.5)
nRBC: 0 % (ref 0.0–0.2)

## 2018-10-05 LAB — COMPREHENSIVE METABOLIC PANEL
ALT: 229 U/L — ABNORMAL HIGH (ref 0–44)
AST: 277 U/L — ABNORMAL HIGH (ref 15–41)
Albumin: 2.7 g/dL — ABNORMAL LOW (ref 3.5–5.0)
Alkaline Phosphatase: 758 U/L — ABNORMAL HIGH (ref 38–126)
Anion gap: 7 (ref 5–15)
BUN: 18 mg/dL (ref 8–23)
CO2: 23 mmol/L (ref 22–32)
Calcium: 8.5 mg/dL — ABNORMAL LOW (ref 8.9–10.3)
Chloride: 112 mmol/L — ABNORMAL HIGH (ref 98–111)
Creatinine, Ser: 1.49 mg/dL — ABNORMAL HIGH (ref 0.44–1.00)
GFR calc Af Amer: 41 mL/min — ABNORMAL LOW (ref >60)
GFR calc non Af Amer: 35 mL/min — ABNORMAL LOW (ref >60)
Glucose, Bld: 113 mg/dL — ABNORMAL HIGH (ref 70–99)
Potassium: 3.3 mmol/L — ABNORMAL LOW (ref 3.5–5.1)
Sodium: 142 mmol/L (ref 135–145)
Total Bilirubin: 2 mg/dL — ABNORMAL HIGH (ref 0.3–1.2)
Total Protein: 6.4 g/dL — ABNORMAL LOW (ref 6.5–8.1)

## 2018-10-05 LAB — HEMOGLOBIN AND HEMATOCRIT, BLOOD
HCT: 25.3 % — ABNORMAL LOW (ref 36.0–46.0)
HCT: 24.2 % — ABNORMAL LOW (ref 36.0–46.0)
Hemoglobin: 7.6 g/dL — ABNORMAL LOW (ref 12.0–15.0)
Hemoglobin: 7.3 g/dL — ABNORMAL LOW (ref 12.0–15.0)

## 2018-10-05 LAB — GLUCOSE, CAPILLARY
Glucose-Capillary: 113 mg/dL — ABNORMAL HIGH (ref 70–99)
Glucose-Capillary: 226 mg/dL — ABNORMAL HIGH (ref 70–99)
Glucose-Capillary: 84 mg/dL (ref 70–99)
Glucose-Capillary: 86 mg/dL (ref 70–99)

## 2018-10-05 LAB — MAGNESIUM: Magnesium: 1.5 mg/dL — ABNORMAL LOW (ref 1.7–2.4)

## 2018-10-05 MED ORDER — POTASSIUM CHLORIDE CRYS ER 20 MEQ PO TBCR
40.0000 meq | EXTENDED_RELEASE_TABLET | Freq: Once | ORAL | Status: AC
  Administered 2018-10-05: 40 meq via ORAL
  Filled 2018-10-05: qty 2

## 2018-10-05 NOTE — Progress Notes (Addendum)
[facility]:  [phone] General Surgery Progress Note   LOS: 3 days  POD -  2 Days Post-Op  Chief Complaint: Abdominal pain  Assessment and [name]: 1.  Choledocholithiasis, hemobilia  ENDOSCOPIC RETROGRADE CHOLANGIOPANCREATOGRAPHY (ERCP), SPHINCTEROTOMY - 10/03/2018 [name]  She had another attack of pain last night - right sided.  But feels fine today.  2.  Cholecystitis/cholelithiasis with possible GB perforation  The patient has been thought to be very high risk for surgical complication with cholecystectomy.  [name] antibiotic treatment of gall bladder disease for now.  On Zosyn  WBC - 4,100 - 10/05/2018  Will advance to reg diet 3.  CAD 4.  Hx of MI with stenting 5.  HTN 6.  IDDM 7.  CKD  Creatinine - 1.49 - 10/05/2018 8.  Right colectomy in 2009 - post polypectomy colonic perforation 9.  DVT prophylaxis - Lovenox 10.  Anemia   Hgb - 7.2 - 10/05/2018 11.  History of right breast cancer - Her2Neu positive Right mastectomy - 11/27/2017 - [facility] On herceptin by Dr. Lindi Adie 12.  Elevated LFT's  Maybe a little better than 8/28   Active Problems:   Choledocholithiasis   Dilated bile duct   Jaundice   Elevated LFTs   Hemobilia  Subjective:  Right sided abdominal pain last night, no pain this AM.  [name], her sister lives in [place], so she cannot help her when she goes home.   [name] lives with her son and grandson.  Her grandson comes by every day.  Objective:   Vitals:   10/04/18 2101 10/05/18 0530  BP: (!) 119/55 96/69  Pulse: 61 61  Resp: 20 17  Temp: 98.1 F (36.7 C) 98.2 F (36.8 C)  SpO2: 94% 95%     Intake/Output from previous day:  08/29 0701 - 08/30 0700 In: 1845.2 [I.V.:1718.5; IV Piggyback:126.7] Out: -   Intake/Output this shift:  No intake/output data recorded.   Physical Exam:   General: WN AA F who is alert and oriented.   Marland Kitchen   HEENT: Normal. Pupils equal. .   Lungs:  Clear   Abdomen: No pain or tenderness.  Has BS.   Lab Results:    Recent Labs    10/04/18 1001 10/05/18 0500  WBC 5.5 4.1  HGB 8.7* 7.2*  HCT 28.4* 24.0*  PLT 266 234    BMET   Recent Labs    10/03/18 1714 10/05/18 0500  NA 139 142  K 3.8 3.3*  CL 106 112*  CO2 23 23  GLUCOSE 118* 113*  BUN 24* 18  CREATININE 1.72* 1.49*  CALCIUM 8.5* 8.5*    PT/INR   Recent Labs    10/03/18 1714  LABPROT 13.2  INR 1.0    ABG  No results for input(s): PHART, HCO3 in the last 72 hours.  Invalid input(s): PCO2, PO2   Studies/Results:  Dg Ercp Biliary & Pancreatic Ducts  Result Date: 10/03/2018 CLINICAL DATA:  Choledocholithiasis, biliary dilatation EXAM: ERCP with sphincterotomy and balloon extraction TECHNIQUE: Multiple spot images obtained with the fluoroscopic device and submitted for interpretation post-procedure. FLUOROSCOPY TIME:  Fluoroscopy Time:  7 minutes 5 seconds Radiation Exposure Index (if provided by the fluoroscopic device): 127.74 COMPARISON:  10/02/2018 CT FINDINGS: Intraoperative cholangiogram during the ERCP demonstrate biliary dilatation and filling defects throughout the common bile duct without obstruction. Guidewire access obtained. Balloon extraction performed for removal of the duct stones, debris and/or sludge. Please refer to the ERCP for details of the  procedure. IMPRESSION: Limited imaging during ERCP with sphincterotomy and balloon extraction to remove sludge and clots from the common bile duct as described in the ERCP report. These images were submitted for radiologic interpretation only. Please see the procedural report for the amount of contrast and the fluoroscopy time utilized. Electronically Signed   By: Jerilynn Mages.  Shick M.D.   On: 10/03/2018 16:09     Anti-infectives:   Anti-infectives (From admission, onward)   Start     Dose/Rate Route Frequency Ordered Stop   10/02/18 2000  piperacillin-tazobactam (ZOSYN) IVPB 3.375 g     3.375 g 12.5 mL/hr over  240 Minutes Intravenous Every 8 hours 10/02/18 1809     10/02/18 1030  cefTRIAXone (ROCEPHIN) 2 g in sodium chloride 0.9 % 100 mL IVPB     2 g 200 mL/hr over 30 Minutes Intravenous  Once 10/02/18 1026 10/02/18 1130   10/02/18 1030  metroNIDAZOLE (FLAGYL) IVPB 500 mg     500 mg 100 mL/hr over 60 Minutes Intravenous  Once 10/02/18 1026 10/02/18 1235      Alphonsa Overall, MD, FACS Pager: [facility]: [phone] 10/05/2018

## 2018-10-05 NOTE — Progress Notes (Signed)
Pharmacy Antibiotic Note  [name] is a 68 y.o. female admitted on 10/02/2018 with intra-abdominal infection.  Pharmacy has been consulted for Zosyn dosing. SCr remains stable, CrCl ~27 ml/min.   Plan:  Zosyn 3.375g IV q8h (4 hour infusion).  Monitor renal function and cx data   Height: 4\' 11"  (149.9 cm) Weight: 125 lb (56.7 kg) IBW/kg (Calculated) : 43.2  Temp (24hrs), Avg:98.3 F (36.8 C), Min:98.1 F (36.7 C), Max:98.6 F (37 C)  Recent Labs  Lab 10/02/18 1020 10/02/18 1033 10/03/18 1714 10/04/18 1001 10/05/18 0500  WBC 6.8  --   --  5.5 4.1  CREATININE 1.37*  --  1.72*  --  1.49*  LATICACIDVEN  --  0.6  --   --   --     Estimated Creatinine Clearance: 27 mL/min (A) (by C-G formula based on SCr of 1.49 mg/dL (H)).    No Known Allergies  Antimicrobials this admission: 8/27 Zosyn >>  8/27 Rocephin/Flagyl x1 dose in ED  Dose adjustments this admission:  Microbiology results: 8/27 COVID: negative  Thank you for allowing pharmacy to be a part of this patient's care.  Arrie Senate, PharmD, BCPS Clinical Pharmacist Please check AMION for all [place] numbers 10/05/2018

## 2018-10-05 NOTE — Progress Notes (Addendum)
[facility]                                                                               [name], is a 68 y.o. female, DOB - 1950-12-29, SNK:[id number]  Admit date - 10/02/2018   Admitting Physician Hosie Poisson, MD  Outpatient Primary MD for the patient is Tsosie Billing, MD (Inactive)  Outpatient specialists:   LOS - 3  days   Brief summary   [name] is a 68 y.o. female with medical history significant of chronic cholelithiasis, coronary artery disease, asthma, GERD, hypertension, hyperlipidemia, iron deficiency anemia was recently discharged from the hospital (8/13-8/20) after being treated for acute cholecystitis with IV antibiotics and conservative management presented to the hospital again with persistent abdominal pain for 2 to 3 days associated with some nausea and dry heaving.  At that time CA-19-9 44, AFP was normal.  At that time, general surgery was consulted, patient was unable to have PERC drain as gallbladder was contracted.  Patient was felt high risk for injury to CBD or bowel injury to was conservatively managed with IV antibiotics and discharged home with plans to follow-up with Dr. Ninfa Linden in the office in 6 to 8 weeks for interval cholecystectomy.   In ED, patient was afebrile, tachypneic, hypertensive, BP 184/100, creatinine 1.3, alk phos 879, lipase 82, AST 280, ALT 267. CT of the abdomen and pelvis showed cholelithiasis with gallbladder wall thickening representing acute cholecystitis, biliary dilatation measuring up to 14 mm, increased density in CBD may represent sludge versus choledocholithiasis.  Patient was then readmitted to the hospital.  Assessment & Plan   Acute cholecystitis with acute choledocholithiasis with elevated LFTs   Surgery and GI was consulted.patient underwent ERCP with findings of sludge and clots and sphincterectomy was done.  Statins on hold due to elevated LFT.  Continue Zosyn.  Trending down  LFTs.  Patient did have episode of severe abdominal pain yesterday.  Nursing staff reported dark stool today.  Surgery feels that the hemobilia was likely from cholelithiasis cholecystitis.  We will continue to monitor hemoglobin closely.  No fever or leukocytosis at this time.  Melena- dark stool likely from hemobilia- status post GI intervention.  Will closely monitor.  Transfuse PRBC if necessary.  Follow GI recommendation.  Globin has dropped from 8.7-7.2 today.  Essential hypertension on Norvasc, metoprolol.  Blood pressure is reasonably stable   chronic CKD stage III  -Baseline creatinine ~1.5.  Creatinine of 1.4 today.  We will continue to monitor  History of asthma Currently compensated.  Oxygen, bronchodilators as needed.  Diabetes mellitus type 2, IDDM - Lantus on hold., continue sliding scale insulin.  Overall glycemic control is adequate at this time.  Mild hypokalemia.  Will replace. Check BMP  CAD status post stent in 2013 No acute issues.  Hold Lipitor due to elevated LFTs.  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 Sonny Dandy, on letrozole and Herceptin, on hold due to acute illness  Code Status: Full CODE  DVT Prophylaxis:  Lovenox, due to concern for hemobilia, melena-  will hold. Put on scd.  Family Communication:  I spoke with the patient's sister on the phone and updated her about the clinical condition of the patient.   Disposition Plan: Continue to monitor closely.  GI and surgery on board.  Will follow recommendations. On full liquid. Will get PT evaluation for mobility.    Procedures:  ERCP on 8/28 with sphincterectomy  Consultants:   General surgery Gastroenterology  Antimicrobials:   Zosyn 10/02/2018>  Anti-infectives (From admission, onward)   Start     Dose/Rate Route Frequency Ordered Stop   10/02/18 2000  piperacillin-tazobactam (ZOSYN) IVPB 3.375 g      3.375 g 12.5 mL/hr over 240 Minutes Intravenous Every 8 hours 10/02/18 1809     10/02/18 1030  cefTRIAXone (ROCEPHIN) 2 g in sodium chloride 0.9 % 100 mL IVPB     2 g 200 mL/hr over 30 Minutes Intravenous  Once 10/02/18 1026 10/02/18 1130   10/02/18 1030  metroNIDAZOLE (FLAGYL) IVPB 500 mg     500 mg 100 mL/hr over 60 Minutes Intravenous  Once 10/02/18 1026 10/02/18 1235       Subjective:   Patient denies abdominal pain, nausea, vomiting today but had's episode of severe abdominal pain yesterday.  Nursing staff reported black stools.  Objective:   Vitals:   10/04/18 1939 10/04/18 2101 10/05/18 0530 10/05/18 0917  BP:  (!) 119/55 96/69   Pulse:  61 61   Resp:  20 17   Temp:  98.1 F (36.7 C) 98.2 F (36.8 C)   TempSrc:  Oral Oral   SpO2: 94% 94% 95% 96%  Weight:      Height:        Intake/Output Summary (Last 24 hours) at 10/05/2018 1015 Last data filed at 10/05/2018 0500 Gross per 24 hour  Intake 1845.17 ml  Output -  Net 1845.17 ml     Wt Readings from Last 3 Encounters:  10/03/18 56.7 kg  09/30/18 58.2 kg  09/25/18 58.6 kg    Physical exam  General:  Average built, not in obvious distress  HENT: Normocephalic, pupils equally reacting to light and accommodation.  Mild pallor noted.  Oral mucosa is moist.   Chest:  Clear breath sounds.  Diminished breath sounds bilaterally. No crackles or wheezes.  Left chest wall Port-A-Cath in place.  CVS: S1 &S2 heard. No murmur.  Regular rate and rhythm.  Abdomen: Soft, nontender, nondistended.  Bowel sounds are heard.  Liver is not palpable, no abdominal mass palpated  Extremities: No cyanosis, clubbing or edema.  Peripheral pulses are palpable.  Psych: Alert, awake and oriented, normal mood  CNS:  No cranial nerve deficits.  Power equal in all extremities.  No sensory deficits noted.  No cerebellar signs.    Skin: Warm and dry.  No rashes noted.   Data Reviewed:  I have personally reviewed following labs and  imaging studies  Micro Results Recent Results (from the past 240 hour(s))  SARS CORONAVIRUS 2 (TAT 6-12 HRS) Nasal Swab Aptima Multi Swab     Status: None   Collection Time: 10/02/18  2:34 PM   Specimen: Aptima Multi Swab; Nasal Swab  Result Value Ref Range Status   SARS Coronavirus 2 NEGATIVE NEGATIVE Final    Comment: (NOTE) SARS-CoV-2 target nucleic acids are NOT DETECTED. The SARS-CoV-2 RNA is generally detectable in upper and lower respiratory specimens during the acute phase of infection. Negative results do not preclude SARS-CoV-2 infection, do not rule out co-infections with other pathogens,  and should not be used as the sole basis for treatment or other patient management decisions. Negative results must be combined with clinical observations, patient history, and epidemiological information. The expected result is Negative. Fact Sheet for Patients: SugarRoll.be Fact Sheet for Healthcare Providers: https://www.woods-mathews.com/ This test is not yet approved or cleared by the [place] FDA and  has been authorized for detection and/or diagnosis of SARS-CoV-2 by FDA under an Emergency Use Authorization (EUA). This EUA will remain  in effect (meaning this test can be used) for the duration of the COVID-19 declaration under Section 56 4(b)(1) of the Act, 21 U.S.C. section 360bbb-3(b)(1), unless the authorization is terminated or revoked sooner. Performed at [facility], [place] [address], [place], [place] [address]     Radiology Reports Nm Hepatobiliary Liver Func  Result Date: 09/19/2018 CLINICAL DATA:  Abdominal pain, nausea and vomiting. Elevated bilirubin level. EXAM: NUCLEAR MEDICINE HEPATOBILIARY IMAGING TECHNIQUE: Sequential images of the abdomen were obtained out to 60 minutes following intravenous administration of radiopharmaceutical. RADIOPHARMACEUTICALS:  7.1 mCi Tc-72m Choletec IV COMPARISON:  MRI 09/18/2018  and CT AP 09/18/2018 FINDINGS: Prompt uptake and biliary excretion of activity by the liver is seen. Biliary activity passes into small bowel, consistent with patent common bile duct. After 1 hour of imaging no gallbladder activity identified. The patient was then given 2.4 mg of morphine sulfate, IV. Imaging was then subsequently performed for an additional 30 minutes without visualization of the gallbladder. IMPRESSION: 1. Nonvisualization of the gallbladder compatible with cystic duct obstruction which may be secondary to acute cholecystitis. 2. Patent common bile duct. Electronically Signed   By: TKerby MoorsM.D.   On: 09/19/2018 15:25   Ct Abdomen Pelvis W Contrast  Result Date: 10/02/2018 CLINICAL DATA:  68year old female with acute abdominal and pelvic pain and nausea. EXAM: CT ABDOMEN AND PELVIS WITH CONTRAST TECHNIQUE: Multidetector CT imaging of the abdomen and pelvis was performed using the standard protocol following bolus administration of intravenous contrast. CONTRAST:  855mOMNIPAQUE IOHEXOL 300 MG/ML  SOLN COMPARISON:  09/18/2018 CT FINDINGS: Lower chest: Bibasilar atelectasis is identified. Cardiomegaly again noted. Hepatobiliary: Cholelithiasis with gallbladder wall thickening again noted. There is intrahepatic and extrahepatic biliary dilatation to the ampulla with the CBD measuring up to 14 mm in greatest diameter. Slight increased density within the visualized CBD is noted and may represent sludge versus choledocholithiasis. Pancreas: Unremarkable Spleen: Unremarkable Adrenals/Urinary Tract: The kidneys, adrenal glands and bladder are unremarkable except for unchanged bilateral renal cortical thinning and probable tiny bilateral renal cysts. There is no evidence of hydronephrosis or obstructing urinary calculi. Stomach/Bowel: Stomach is within normal limits. Surgical changes along the RIGHT colon again noted. No evidence of bowel obstruction. No evidence of bowel wall thickening,  distention, or inflammatory changes. Vascular/Lymphatic: Aortic atherosclerosis. No enlarged abdominal or pelvic lymph nodes. Reproductive: Unremarkable except for a stable calcified RIGHT uterine fibroid. Other: No ascites, focal collection or pneumoperitoneum. Small bilateral inguinal hernias containing fat are again noted. Musculoskeletal: No acute or suspicious bony abnormality. IMPRESSION: 1. Cholelithiasis with gallbladder wall thickening likely representing acute cholecystitis. Intrahepatic and extrahepatic biliary dilatation to the ampulla with the CBD now measuring 14 mm. Increased density within the CBD may represent sludge versus choledocholithiasis. 2. Cardiomegaly and bibasilar atelectasis. 3. Unchanged small bilateral inguinal hernias containing fat. 4.  Aortic Atherosclerosis (ICD10-I70.0). Electronically Signed   By: JeMargarette Canada.D.   On: 10/02/2018 14:21   Ct Abdomen Pelvis W Contrast  Result Date: 09/18/2018 CLINICAL DATA:  Abdominal pain and distension. History of breast carcinoma EXAM: CT ABDOMEN AND PELVIS WITH CONTRAST TECHNIQUE: Multidetector CT imaging of the abdomen and pelvis was performed using the standard protocol following bolus administration of intravenous contrast. CONTRAST:  65m OMNIPAQUE IOHEXOL 300 MG/ML  SOLN COMPARISON:  December 18, 2017 FINDINGS: Lower chest: There is bibasilar atelectasis. There is mild bronchiectatic change in the lung bases as well. There are foci of coronary artery calcification. Hepatobiliary: There is hepatic steatosis. No focal liver lesions are evident. There is cholelithiasis. The gallbladder wall is diffusely thickened and edematous. There is loss of fat plane between the gallbladder and the pylorus/first portion of the duodenum. There is no air within the gallbladder. There is no appreciable intrahepatic biliary duct dilatation. There is dilatation of the distal common bile duct, measuring 1.2 cm. No biliary duct mass or calculus evident.  Pancreas: No pancreatic mass or inflammatory focus evident. No pancreatic duct dilatation. Spleen: No splenic lesions are evident. Adrenals/Urinary Tract: Adrenals bilaterally appear normal. There are subcentimeter cysts in the right kidney. There is an extrarenal pelvis on the right, an anatomic variant. There is no hydronephrosis on either side. There is or ureteral calculus on either side. Urinary bladder wall thickness is within normal limits. Stomach/Bowel: As noted above, there is loss of fat plane between the gallbladder and the pylorus/first portion of the duodenum. There is wall thickening in the distal stomach and proximal duodenum. There is no other appreciable bowel wall thickening. The patient has had a partial right colectomy with anastomosis patent in this area. Terminal ileum appears unremarkable. No evident bowel obstruction. There is no free air or portal venous air. Vascular/Lymphatic: There is aortic and iliac artery atherosclerosis. No aneurysm evident. Major mesenteric arterial vessels appear patent, although there is severe narrowing at the origin of the right renal artery and extensive calcification with severe narrowing in the proximal left renal artery. No adenopathy is evident in the abdomen or pelvis. Reproductive: There are calcifications in the uterus, felt to be indicative of leiomyomatous change. The uterus is slightly canted toward the right. No pelvic mass is demonstrable. Other: Appendix absent. There is no abscess or ascites evident in the abdomen or pelvis. There is fat in each inguinal ring. Musculoskeletal: There are no blastic or lytic bone lesions. There is bony hypertrophy at L4 and diffuse disc protrusion. These findings in concert leads spinal stenosis. Similar changes are noted at L3-4 with spinal stenosis also present at L3-4. No blastic or lytic bone lesions. No intramuscular lesions are evident. IMPRESSION: 1. There is cholelithiasis with diffuse gallbladder wall  thickening and edema. There is loss of fat plane between the gallbladder and the distal stomach/proximal duodenum. 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 No air seen in the gallbladder. 2. There is dilatation of the common hepatic and common bile ducts. No intrahepatic biliary duct dilatation seen. No biliary duct mass or calculus evident. A mass at the ampulla could cause these findings. From an imaging standpoint, MRCP would be the optimum study of choice to further evaluate in this regard. 3.  Hepatic steatosis.  No focal liver lesions appreciable. 4. No bowel obstruction. No abscess in the abdomen or pelvis. The patient is status post partial right colectomy with anastomosis patent. 5. Spinal stenosis at L3-4 and L4-5 due to disc protrusions and bony hypertrophy at these levels. 6. Extensive calcification and narrowing in the proximal renal arteries bilaterally. Question whether patient is hypertensive; there may well be renovascular hypertension given this appearance.  7.  Extensive aortic and iliac artery atherosclerosis. 8. Calcifications in the uterus consistent with leiomyomatous change. Electronically Signed   By: Lowella Grip III M.D.   On: 09/18/2018 14:26   Mr Abdomen Mrcp Wo Contrast  Result Date: 09/18/2018 CLINICAL DATA:  Abdominal pain/distension, Hemoccult-positive, history of breast cancer status post right mastectomy EXAM: MRI ABDOMEN WITHOUT CONTRAST  (INCLUDING MRCP) TECHNIQUE: Multiplanar multisequence MR imaging of the abdomen was performed. Heavily T2-weighted images of the biliary and pancreatic ducts were obtained, and three-dimensional MRCP images were rendered by post processing. COMPARISON:  CT abdomen/pelvis dated 09/18/2018 FINDINGS: Motion degraded images. Lower chest: Lung bases are clear. Hepatobiliary: Suspected iron deposition in the liver. No focal hepatic lesion is seen. Cholelithiasis with gallbladder wall thickening/inflammatory changes.  Associated 1.9 x 2.8 cm cystic lesion along the wall of the gallbladder (series 8/image 26) favors localized perforation. No intrahepatic or extrahepatic ductal dilatation. Pancreas:  Within normal limits. Spleen:  Within normal limits. Adrenals/Urinary Tract:  Adrenal glands are within normal limits. 8 mm posterior right upper pole renal cyst with mild right renal cortical scarring. Left kidney is within normal limits. No hydronephrosis. Stomach/Bowel: Stomach is within normal limits. Proximal duodenum is mildly thick-walled and adherent to the gallbladder (series 8/image 27). While direct fistulous communication is not demonstrated on MR, this may reflect involvement of the inflammatory process. Visualized bowel is otherwise unremarkable. Vascular/Lymphatic:  No evidence of abdominal aortic aneurysm. No suspicious abdominal lymphadenopathy. Other:  No abdominal ascites. Musculoskeletal: No focal osseous lesions. IMPRESSION: Cholelithiasis with acute cholecystitis. Associated 2.8 cm cystic lesion along the wall the gallbladder favors localized perforation. Adherent proximal duodenum with mild wall thickening suggest involvement with the right upper quadrant inflammatory process. Direct fistulous communication is not demonstrated on MR. Additional ancillary findings as above. Electronically Signed   By: Julian Hy M.D.   On: 09/18/2018 22:04   Dg Ercp Biliary & Pancreatic Ducts  Result Date: 10/03/2018 CLINICAL DATA:  Choledocholithiasis, biliary dilatation EXAM: ERCP with sphincterotomy and balloon extraction TECHNIQUE: Multiple spot images obtained with the fluoroscopic device and submitted for interpretation post-procedure. FLUOROSCOPY TIME:  Fluoroscopy Time:  7 minutes 5 seconds Radiation Exposure Index (if provided by the fluoroscopic device): 127.74 COMPARISON:  10/02/2018 CT FINDINGS: Intraoperative cholangiogram during the ERCP demonstrate biliary dilatation and filling defects throughout the  common bile duct without obstruction. Guidewire access obtained. Balloon extraction performed for removal of the duct stones, debris and/or sludge. Please refer to the ERCP for details of the procedure. IMPRESSION: Limited imaging during ERCP with sphincterotomy and balloon extraction to remove sludge and clots from the common bile duct as described in the ERCP report. These images were submitted for radiologic interpretation only. Please see the procedural report for the amount of contrast and the fluoroscopy time utilized. Electronically Signed   By: Jerilynn Mages.  Shick M.D.   On: 10/03/2018 16:09   Dg Abdomen Acute W/chest  Result Date: 09/18/2018 CLINICAL DATA:  Acute generalized abdominal pain. EXAM: DG ABDOMEN ACUTE W/ 1V CHEST COMPARISON:  Radiographs of April 10, 2018 and February 27, 2015. FINDINGS: There is no evidence of dilated bowel loops or free intraperitoneal air. No radiopaque calculi or other significant radiographic abnormality is seen. Stable cardiomediastinal silhouette is noted. Atherosclerosis of thoracic aorta is noted. Left internal jugular Port-A-Cath is noted in grossly good position. Both lungs are clear. IMPRESSION: No evidence of bowel obstruction or ileus. No acute cardiopulmonary disease. Aortic Atherosclerosis (ICD10-I70.0). Electronically Signed   By: Bobbe Medico.D.  On: 09/18/2018 13:24   US Abdomen Limited Ruq  Result Date: 10/02/2018 CLINICAL DATA:  Severe abdominal pain EXAM: ULTRASOUND ABDOMEN LIMITED RIGHT UPPER QUADRANT COMPARISON:  Ultrasound right upper quadrant September 19, 2018; CT abdomen and pelvis September 18 2018 and abdominal MRI September 18, 2018 FINDINGS: Gallbladder: There is a 1.7 cm calcification within the gallbladder consistent with a large gallstone. The gallbladder wall remains thickened edematous with localized fluid which may represent loculated perforation of the gallbladder. Patient is focally tender over the gallbladder. Common bile duct: Diameter: 7 mm.  There is slight intrahepatic biliary duct dilatation. Liver: No focal lesion identified. Within normal limits in parenchymal echogenicity. Portal vein is patent on color Doppler imaging with normal direction of blood flow towards the liver. Other: None. IMPRESSION: Findings in the gallbladder indicative of acute cholecystitis with questionable loculated area of perforation. Common bile duct upper normal in size. Slight degree of intrahepatic biliary duct dilatation felt to be present. Electronically Signed   By: Lowella Grip III M.D.   On: 10/02/2018 11:38   US Abdomen Limited Ruq  Result Date: 09/19/2018 CLINICAL DATA:  Cholecystitis EXAM: ULTRASOUND ABDOMEN LIMITED RIGHT UPPER QUADRANT COMPARISON:  MRI September 18, 2018, CT September 18, 2018. FINDINGS: Gallbladder: There is a large echogenic mass seen on the peripheral outer wall of the gallbladder with posterior shadowing, likely calcified mass as seen on prior MRI and CT exam. This measures 1.4 by 1.5 x 1.2 cm. There is diffuse gallbladder wall thickening measuring 6 mm. No definite pericholecystic fluid is seen. No sonographic Percell Miller sign is seen. Common bile duct: Diameter: 4 mm. Liver: No focal lesion identified. Within normal limits in parenchymal echogenicity. Portal vein is patent on color Doppler imaging with normal direction of blood flow towards the liver. Other: None. IMPRESSION: 1. Large calcified mass seen on the peripheral outer wall of the gallbladder measuring 1.4 x 1.5 x 1.2 cm with diffuse thickened gallbladder wall. Findings suggestive of cholecystitis. 2. Normal appearing liver. 3. No extrahepatic biliary ductal dilatation. Electronically Signed   By: Prudencio Pair M.D.   On: 09/19/2018 18:11    Lab Data:  CBC: Recent Labs  Lab 10/02/18 1020 10/04/18 1001 10/05/18 0500  WBC 6.8 5.5 4.1  HGB 10.6* 8.7* 7.2*  HCT 33.6* 28.4* 24.0*  MCV 100.0 101.4* 103.0*  PLT 363 266 025   Basic Metabolic Panel: Recent Labs  Lab  10/02/18 1020 10/03/18 1714 10/05/18 0500  NA 140 139 142  K 3.4* 3.8 3.3*  CL 110 106 112*  CO2 20* 23 23  GLUCOSE 115* 118* 113*  BUN 15 24* 18  CREATININE 1.37* 1.72* 1.49*  CALCIUM 8.4* 8.5* 8.5*  MG  --   --  1.5*   GFR: Estimated Creatinine Clearance: 27 mL/min (A) (by C-G formula based on SCr of 1.49 mg/dL (H)). Liver Function Tests: Recent Labs  Lab 10/02/18 1020 10/03/18 1714 10/05/18 0500  AST 280* 186* 277*  ALT 267* 242* 229*  ALKPHOS 879* 909* 758*  BILITOT 3.7* 3.2* 2.0*  PROT 7.1 7.4 6.4*  ALBUMIN 3.2* 3.0* 2.7*   Recent Labs  Lab 10/02/18 1020  LIPASE 82*   No results for input(s): AMMONIA in the last 168 hours. Coagulation Profile: Recent Labs  Lab 10/03/18 1714  INR 1.0   Cardiac Enzymes: No results for input(s): CKTOTAL, CKMB, CKMBINDEX, TROPONINI in the last 168 hours. BNP (last 3 results) No results for input(s): PROBNP in the last 8760 hours. HbA1C: No results for input(s):  HGBA1C in the last 72 hours. CBG: Recent Labs  Lab 10/04/18 0724 10/04/18 1127 10/04/18 1610 10/04/18 2147 10/05/18 0737  GLUCAP 133* 156* 127* 129* 113*   Lipid Profile: No results for input(s): CHOL, HDL, LDLCALC, TRIG, CHOLHDL, LDLDIRECT in the last 72 hours. Thyroid Function Tests: No results for input(s): TSH, T4TOTAL, FREET4, T3FREE, THYROIDAB in the last 72 hours. Anemia Panel: No results for input(s): VITAMINB12, FOLATE, FERRITIN, TIBC, IRON, RETICCTPCT in the last 72 hours. Urine analysis:    Component Value Date/Time   COLORURINE YELLOW 11/27/2015 0743   APPEARANCEUR CLOUDY (A) 11/27/2015 0743   LABSPEC 1.019 11/27/2015 0743   PHURINE 6.0 11/27/2015 0743   GLUCOSEU NEGATIVE 11/27/2015 0743   HGBUR NEGATIVE 11/27/2015 0743   BILIRUBINUR NEGATIVE 11/27/2015 0743   KETONESUR NEGATIVE 11/27/2015 0743   PROTEINUR NEGATIVE 11/27/2015 0743   UROBILINOGEN 0.2 03/31/2014 2017   NITRITE NEGATIVE 11/27/2015 0743   LEUKOCYTESUR MODERATE (A) 11/27/2015  0743   Medications  Scheduled Meds: . ALPRAZolam  0.25 mg Oral QHS  . amLODipine  5 mg Oral Daily  . enoxaparin (LOVENOX) injection  30 mg Subcutaneous Q24H  . indomethacin  100 mg Rectal Once  . insulin aspart  0-5 Units Subcutaneous QHS  . insulin aspart  0-9 Units Subcutaneous TID WC  . metoprolol succinate  25 mg Oral Daily  . mometasone-formoterol  2 puff Inhalation BID   Continuous Infusions: . sodium chloride 75 mL/hr at 10/04/18 0600  . sodium chloride Stopped (10/04/18 0344)  . piperacillin-tazobactam (ZOSYN)  IV 12.5 mL/hr at 10/04/18 0600   PRN Meds:.sodium chloride, acetaminophen, albuterol, hydrALAZINE, HYDROmorphone (DILAUDID) injection, nitroGLYCERIN, ondansetron (ZOFRAN) IV   Flora Lipps M.D. [facility] 10/05/2018, 10:15 AM

## 2018-10-05 NOTE — Progress Notes (Signed)
NT reported to RN that patient had a dark BM this morning.  Patient also had a drop in her hemoglobin.  Today's hgb is 7.2, down from 8.7 yesterday.  MD at bedside made aware. Will continue to monitor.

## 2018-10-06 ENCOUNTER — Encounter (HOSPITAL_COMMUNITY): Payer: Self-pay | Admitting: Gastroenterology

## 2018-10-06 DIAGNOSIS — E876 Hypokalemia: Secondary | ICD-10-CM

## 2018-10-06 LAB — COMPREHENSIVE METABOLIC PANEL
ALT: 247 U/L — ABNORMAL HIGH (ref 0–44)
AST: 180 U/L — ABNORMAL HIGH (ref 15–41)
Albumin: 2.9 g/dL — ABNORMAL LOW (ref 3.5–5.0)
Alkaline Phosphatase: 773 U/L — ABNORMAL HIGH (ref 38–126)
Anion gap: 8 (ref 5–15)
BUN: 11 mg/dL (ref 8–23)
CO2: 23 mmol/L (ref 22–32)
Calcium: 9.1 mg/dL (ref 8.9–10.3)
Chloride: 112 mmol/L — ABNORMAL HIGH (ref 98–111)
Creatinine, Ser: 1.49 mg/dL — ABNORMAL HIGH (ref 0.44–1.00)
GFR calc Af Amer: 41 mL/min — ABNORMAL LOW (ref >60)
GFR calc non Af Amer: 35 mL/min — ABNORMAL LOW (ref >60)
Glucose, Bld: 95 mg/dL (ref 70–99)
Potassium: 3.6 mmol/L (ref 3.5–5.1)
Sodium: 143 mmol/L (ref 135–145)
Total Bilirubin: 1.5 mg/dL — ABNORMAL HIGH (ref 0.3–1.2)
Total Protein: 6.9 g/dL (ref 6.5–8.1)

## 2018-10-06 LAB — MAGNESIUM: Magnesium: 1.6 mg/dL — ABNORMAL LOW (ref 1.7–2.4)

## 2018-10-06 LAB — GLUCOSE, CAPILLARY
Glucose-Capillary: 107 mg/dL — ABNORMAL HIGH (ref 70–99)
Glucose-Capillary: 160 mg/dL — ABNORMAL HIGH (ref 70–99)
Glucose-Capillary: 198 mg/dL — ABNORMAL HIGH (ref 70–99)
Glucose-Capillary: 184 mg/dL — ABNORMAL HIGH (ref 70–99)

## 2018-10-06 LAB — HEMOGLOBIN AND HEMATOCRIT, BLOOD
HCT: 25.2 % — ABNORMAL LOW (ref 36.0–46.0)
Hemoglobin: 7.7 g/dL — ABNORMAL LOW (ref 12.0–15.0)

## 2018-10-06 MED ORDER — POTASSIUM CHLORIDE CRYS ER 20 MEQ PO TBCR
40.0000 meq | EXTENDED_RELEASE_TABLET | Freq: Once | ORAL | Status: AC
  Administered 2018-10-06: 40 meq via ORAL
  Filled 2018-10-06: qty 2

## 2018-10-06 MED ORDER — MAGNESIUM OXIDE 400 (241.3 MG) MG PO TABS
400.0000 mg | ORAL_TABLET | Freq: Two times a day (BID) | ORAL | Status: DC
  Administered 2018-10-06 – 2018-10-07 (×3): 400 mg via ORAL
  Filled 2018-10-06 (×3): qty 1

## 2018-10-06 MED ORDER — PANTOPRAZOLE SODIUM 40 MG PO TBEC
40.0000 mg | DELAYED_RELEASE_TABLET | Freq: Every day | ORAL | Status: DC
  Administered 2018-10-06 – 2018-10-07 (×2): 40 mg via ORAL
  Filled 2018-10-06 (×2): qty 1

## 2018-10-06 NOTE — Care Management Important Message (Signed)
Important Message  Patient Details IM Letter given to Dessa Phi RN to present to the Patient Name: [name] MRN: [id number] Date of Birth: 1950/11/17   Medicare Important Message Given:  Yes     Kerin Salen 10/06/2018, 12:12 PM

## 2018-10-06 NOTE — Evaluation (Signed)
Physical Therapy Evaluation Patient Details Name: [name] MRN: [id number] DOB: [birth year] Today's Date: 10/06/2018   History of Present Illness  [name] is a 68 y.o. female with medical history significant of chronic cholelithiasis, coronary artery disease, asthma, GERD, hypertension, hyperlipidemia, iron deficiency anemia was recently discharged from the hospital (8/13-8/20) after being treated for acute cholecystitis with IV antibiotics and conservative management presented to the hospital again with persistent abdominal pain for 2 to 3 days associated with some nausea and dry heaving    Clinical Impression  Pt walked 100 feet in the hall with RW and min guard.  Pt denies dizziness.  Pt worked on increasing her base - wider support.  Pt reports she will have help at home.  Pt reprots feeling at her baseline.   No skilled PT services needed at this time.  Talked with pt about ways to help her get her strength back once home.  Will follow her in the hospital as needed.    Follow Up Recommendations No PT follow up    Equipment Recommendations  None recommended by PT    Recommendations for Other Services       Precautions / Restrictions Precautions Precautions: Fall Precaution Comments: pt denies any recent falls Restrictions Weight Bearing Restrictions: No      Mobility  Bed Mobility Overal bed mobility: Modified Independent Bed Mobility: Supine to Sit     Supine to sit: Modified independent (Device/Increase time)     General bed mobility comments: increased time  Transfers Overall transfer level: Needs assistance Equipment used: Rolling walker (2 wheeled) Transfers: Sit to/from Stand Sit to Stand: Supervision Stand pivot transfers: Min guard       General transfer comment: min/guard for steadying  Ambulation/Gait Ambulation/Gait assistance: Min guard Gait Distance (Feet): 100 Feet Assistive device: Rolling walker (2 wheeled) Gait  Pattern/deviations: Narrow base of support     General Gait Details: pt with narrow base of support - feet almost hitting each other when they are together - educated pt on wider base and more support. she tried to walk with wider base - but would forget - encouraged her to work on this at home  Stairs            Wheelchair Mobility    Modified Rankin (Stroke Patients Only)       Balance Overall balance assessment: Mild deficits observed, not formally tested             Standing balance comment: reliant on UE support.  no loss of balance noted when she has her RW                             Pertinent Vitals/Pain Pain Assessment: No/denies pain Pain Intervention(s): Monitored during session    Home Living Family/patient expects to be discharged to:: Private residence Living Arrangements: Children;Other relatives Available Help at Discharge: Available 24 hours/day Type of Home: House Home Access: Stairs to enter Entrance Stairs-Rails: Right Entrance Stairs-Number of Steps: 4 Home Layout: One level Home Equipment: Cane - single point;Walker - 4 wheels;Shower seat;Bedside commode Additional Comments: pt lives with son and grandson, she states they assist as needed.  Pt reports she uses rollator or cane at home.  denies falls    Prior Function Level of Independence: Independent with assistive device(s)               Hand Dominance  Extremity/Trunk Assessment        Lower Extremity Assessment Lower Extremity Assessment: Generalized weakness    Cervical / Trunk Assessment Cervical / Trunk Assessment: Normal  Communication   Communication: HOH  Cognition Arousal/Alertness: Awake/alert Behavior During Therapy: WFL for tasks assessed/performed Overall Cognitive Status: Within Functional Limits for tasks assessed                                        General Comments General comments (skin integrity, edema, etc.):  pt denies dizziness when up    Exercises     Assessment/Plan    PT Assessment Patient needs continued PT services  PT Problem List Decreased mobility;Decreased activity tolerance;Decreased balance       PT Treatment Interventions DME instruction;Therapeutic activities;Gait training;Balance training;Functional mobility training;Patient/family education    PT Goals (Current goals can be found in the Care Plan section)  Acute Rehab PT Goals Patient Stated Goal: pt reports she is ready to go home PT Goal Formulation: With patient Time For Goal Achievement: 10/13/18 Potential to Achieve Goals: Good    Frequency Min 3X/week   Barriers to discharge        Co-evaluation               AM-PAC PT "6 Clicks" Mobility  Outcome Measure Help needed turning from your back to your side while in a flat bed without using bedrails?: None Help needed moving from lying on your back to sitting on the side of a flat bed without using bedrails?: None Help needed moving to and from a bed to a chair (including a wheelchair)?: A Little Help needed standing up from a chair using your arms (e.g., wheelchair or bedside chair)?: A Little Help needed to walk in hospital room?: A Little Help needed climbing 3-5 steps with a railing? : A Little 6 Click Score: 20    End of Session Equipment Utilized During Treatment: Gait belt Activity Tolerance: Patient tolerated treatment well Patient left: in chair;with chair alarm set;with call bell/phone within reach Nurse Communication: Mobility status PT Visit Diagnosis: Difficulty in walking, not elsewhere classified (R26.2)    Time: 2505-3976 PT Time Calculation (min) (ACUTE ONLY): 25 min   Charges:   PT Evaluation $PT Eval Low Complexity: 1 Low PT Treatments $Gait Training: 8-22 mins        10/06/2018   [name], PT   [name] 10/06/2018, 9:14 AM

## 2018-10-06 NOTE — Progress Notes (Signed)
PHARMACY NOTE -  [name] has been assisting with dosing of Zosyn for cholecystitis.  Dosage remains stable at 3.375g IV q8 hr and need for further dosage adjustment appears unlikely at present given stable SCr  Pharmacy will sign off, following peripherally for culture results or dose adjustments. Please reconsult if a change in clinical status warrants re-evaluation of dosage.  [name], PharmD, BCPS [phone] 10/06/2018, 10:21 AM

## 2018-10-06 NOTE — Progress Notes (Signed)
[facility]                                                                               [name], is a 68 y.o. female, DOB - [birth year], MHD:[id number]  Admit date - 10/02/2018   Admitting Physician Hosie Poisson, MD  Outpatient Primary MD for the [name] is Tsosie Billing, MD (Inactive)  Outpatient specialists:   LOS - 4  days   Brief summary   [name] is a 68 y.o. female with medical history significant of chronic cholelithiasis, coronary artery disease, asthma, GERD, hypertension, hyperlipidemia, iron deficiency anemia was recently discharged from the hospital (8/13-8/20) after being treated for acute cholecystitis with IV antibiotics and conservative management presented to the hospital. She again presented with persistent abdominal pain for 2 to 3 days associated with some nausea and dry heaving.  On the previous admission, CA-19-9 44, AFP was normal.  At that time, general surgery was consulted, [name] was unable to have PERC drain as gallbladder was contracted.  [name] was felt high risk for injury to CBD or bowel injury so was conservatively managed with IV antibiotics and discharged home with plans to follow-up with Dr. Ninfa Linden in the office in 6 to 8 weeks for interval cholecystectomy.   In the ED on this presentation, [name] was afebrile, tachypneic, hypertensive, BP 184/100, creatinine 1.3, alk phos 879, lipase 82, AST 280, ALT 267. CT of the abdomen and pelvis showed cholelithiasis with gallbladder wall thickening representing acute cholecystitis, biliary dilatation measuring up to 14 mm, increased density in CBD may represent sludge versus choledocholithiasis.  [name] was then readmitted to the hospital.  Assessment & Plan   Acute cholecystitis with acute choledocholithiasis with elevated LFTs   Surgery and GI on board. [name] underwent ERCP with findings of sludge and clots and sphincterectomy was done.  Statins on hold due to  elevated LFT.  Continue Zosyn.  Trending down LFTs.  [name] did not have any episodes of abdominal pain since yesterday and wishes to be advanced on her diet.  She still has dark stool.  Surgery feels that the hemobilia was likely from cholelithiasis, cholecystitis.  We will continue to monitor hemoglobin closely.  Globin has remained stable since yesterday.  No fever or leukocytosis at this time.  Melena- dark stool likely from hemobilia- status post GI intervention.  Will closely monitor.  Transfuse PRBC if necessary.  Hemoglobin of 7.7 today from 7.3   Essential hypertension on Norvasc, metoprolol.  Controlled   chronic CKD stage III  -Baseline creatinine ~1.5.  Creatinine of 1.4 today.  We will continue to monitor  History of asthma Currently compensated.  Oxygen, bronchodilators as needed.  Diabetes mellitus type 2, IDDM - Lantus on hold., continue sliding scale insulin.  Overall glycemic control is adequate at this time.  Mild hypokalemia.  Improved we will continue to replace.  Mild hypomagnesemia.  Will replace   CAD status post stent in 2013 No acute issues.  Hold Lipitor due to elevated LFTs.  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 Sonny Dandy,  on letrozole and Herceptin, on hold due to acute illness  Code Status: Full CODE  DVT Prophylaxis: Started on SCDs.  Family Communication:  I spoke with the [name]'s sister on the phone and updated her about the clinical condition of the [name] yesterday.   Disposition Plan: Continue to monitor CBC. added Protonix yesterday. Surgery on board recommending advancing diet.  Currently on full liquid physical therapy has seen the [name] and recommend no physical therapy needs.  Disposition home by tomorrow if the [name] tolerates oral diet and hemoglobin remains stable.  Procedures:  ERCP on 8/28 with sphincterectomy  Consultants:    General surgery Gastroenterology  Antimicrobials:   Zosyn 10/02/2018>  Anti-infectives (From admission, onward)   Start     Dose/Rate Route Frequency Ordered Stop   10/02/18 2000  piperacillin-tazobactam (ZOSYN) IVPB 3.375 g     3.375 g 12.5 mL/hr over 240 Minutes Intravenous Every 8 hours 10/02/18 1809     10/02/18 1030  cefTRIAXone (ROCEPHIN) 2 g in sodium chloride 0.9 % 100 mL IVPB     2 g 200 mL/hr over 30 Minutes Intravenous  Once 10/02/18 1026 10/02/18 1130   10/02/18 1030  metroNIDAZOLE (FLAGYL) IVPB 500 mg     500 mg 100 mL/hr over 60 Minutes Intravenous  Once 10/02/18 1026 10/02/18 1235       Subjective:   [name] denies any abdominal pain, nausea, vomiting.  She does have dark stool but no bright red blood.  Denies any dizziness, chest pain, palpitation.  Objective:   Vitals:   10/05/18 2000 10/05/18 2033 10/06/18 0415 10/06/18 0816  BP:  121/71 129/73   Pulse:  (!) 57 69   Resp:  18 18   Temp:  99.1 F (37.3 C) 98.3 F (36.8 C)   TempSrc:  Oral    SpO2: 96% 100% 94% 99%  Weight:      Height:        Intake/Output Summary (Last 24 hours) at 10/06/2018 0935 Last data filed at 10/06/2018 0600 Gross per 24 hour  Intake 2450.94 ml  Output 1403 ml  Net 1047.94 ml     Wt Readings from Last 3 Encounters:  10/03/18 56.7 kg  09/30/18 58.2 kg  09/25/18 58.6 kg    Physical exam  General:  Average built, not in obvious distress  HENT: Normocephalic, pupils equally reacting to light and accommodation.  No scleral  icterus noted.  Mild pallor noted.  Oral mucosa is moist.   Chest:  Clear breath sounds.  Diminished breath sounds bilaterally. No crackles or wheezes.  Left chest wall Port-A-Cath in place.  CVS: S1 &S2 heard. No murmur.  Regular rate and rhythm.  Abdomen: Soft, nontender, nondistended.  Bowel sounds are heard.  Liver is not palpable, no abdominal mass palpated  Extremities: No cyanosis, clubbing or edema.  Peripheral pulses are  palpable.  Psych: Alert, awake and oriented, normal mood  CNS:  No cranial nerve deficits.  Power equal in all extremities.  No sensory deficits noted.  No cerebellar signs.    Skin: Warm and dry.  No rashes noted.   Data Reviewed:  I have personally reviewed following labs and imaging studies  Micro Results Recent Results (from the past 240 hour(s))  SARS CORONAVIRUS 2 (TAT 6-12 HRS) Nasal Swab Aptima Multi Swab     Status: None   Collection Time: 10/02/18  2:34 PM   Specimen: Aptima Multi Swab; Nasal Swab  Result Value Ref Range Status   SARS Coronavirus 2  NEGATIVE NEGATIVE Final    Comment: (NOTE) SARS-CoV-2 target nucleic acids are NOT DETECTED. The SARS-CoV-2 RNA is generally detectable in upper and lower respiratory specimens during the acute phase of infection. Negative results do not preclude SARS-CoV-2 infection, do not rule out co-infections with other pathogens, and should not be used as the sole basis for treatment or other [name] management decisions. Negative results must be combined with clinical observations, [name] history, and epidemiological information. The expected result is Negative. Fact Sheet for Patients: SugarRoll.be Fact Sheet for Healthcare Providers: https://www.woods-mathews.com/ This test is not yet approved or cleared by the [place] FDA and  has been authorized for detection and/or diagnosis of SARS-CoV-2 by FDA under an Emergency Use Authorization (EUA). This EUA will remain  in effect (meaning this test can be used) for the duration of the COVID-19 declaration under Section 56 4(b)(1) of the Act, 21 U.S.C. section 360bbb-3(b)(1), unless the authorization is terminated or revoked sooner. Performed at [facility], [place] [address] [place], [place] [address]     Radiology Reports Nm Hepatobiliary Liver Func  Result Date: 09/19/2018 CLINICAL DATA:  Abdominal pain, nausea and vomiting.  Elevated bilirubin level. EXAM: NUCLEAR MEDICINE HEPATOBILIARY IMAGING TECHNIQUE: Sequential images of the abdomen were obtained out to 60 minutes following intravenous administration of radiopharmaceutical. RADIOPHARMACEUTICALS:  7.1 mCi Tc-102m Choletec IV COMPARISON:  MRI 09/18/2018 and CT AP 09/18/2018 FINDINGS: Prompt uptake and biliary excretion of activity by the liver is seen. Biliary activity passes into small bowel, consistent with patent common bile duct. After 1 hour of imaging no gallbladder activity identified. The [name] was then given 2.4 mg of morphine sulfate, IV. Imaging was then subsequently performed for an additional 30 minutes without visualization of the gallbladder. IMPRESSION: 1. Nonvisualization of the gallbladder compatible with cystic duct obstruction which may be secondary to acute cholecystitis. 2. Patent common bile duct. Electronically Signed   By: TKerby MoorsM.D.   On: 09/19/2018 15:25   Ct Abdomen Pelvis W Contrast  Result Date: 10/02/2018 CLINICAL DATA:  68year old female with acute abdominal and pelvic pain and nausea. EXAM: CT ABDOMEN AND PELVIS WITH CONTRAST TECHNIQUE: Multidetector CT imaging of the abdomen and pelvis was performed using the standard protocol following bolus administration of intravenous contrast. CONTRAST:  851mOMNIPAQUE IOHEXOL 300 MG/ML  SOLN COMPARISON:  09/18/2018 CT FINDINGS: Lower chest: Bibasilar atelectasis is identified. Cardiomegaly again noted. Hepatobiliary: Cholelithiasis with gallbladder wall thickening again noted. There is intrahepatic and extrahepatic biliary dilatation to the ampulla with the CBD measuring up to 14 mm in greatest diameter. Slight increased density within the visualized CBD is noted and may represent sludge versus choledocholithiasis. Pancreas: Unremarkable Spleen: Unremarkable Adrenals/Urinary Tract: The kidneys, adrenal glands and bladder are unremarkable except for unchanged bilateral renal cortical thinning and  probable tiny bilateral renal cysts. There is no evidence of hydronephrosis or obstructing urinary calculi. Stomach/Bowel: Stomach is within normal limits. Surgical changes along the RIGHT colon again noted. No evidence of bowel obstruction. No evidence of bowel wall thickening, distention, or inflammatory changes. Vascular/Lymphatic: Aortic atherosclerosis. No enlarged abdominal or pelvic lymph nodes. Reproductive: Unremarkable except for a stable calcified RIGHT uterine fibroid. Other: No ascites, focal collection or pneumoperitoneum. Small bilateral inguinal hernias containing fat are again noted. Musculoskeletal: No acute or suspicious bony abnormality. IMPRESSION: 1. Cholelithiasis with gallbladder wall thickening likely representing acute cholecystitis. Intrahepatic and extrahepatic biliary dilatation to the ampulla with the CBD now measuring 14 mm. Increased density within the CBD may represent  sludge versus choledocholithiasis. 2. Cardiomegaly and bibasilar atelectasis. 3. Unchanged small bilateral inguinal hernias containing fat. 4.  Aortic Atherosclerosis (ICD10-I70.0). Electronically Signed   By: Margarette Canada M.D.   On: 10/02/2018 14:21   Ct Abdomen Pelvis W Contrast  Result Date: 09/18/2018 CLINICAL DATA:  Abdominal pain and distension. History of breast carcinoma EXAM: CT ABDOMEN AND PELVIS WITH CONTRAST TECHNIQUE: Multidetector CT imaging of the abdomen and pelvis was performed using the standard protocol following bolus administration of intravenous contrast. CONTRAST:  68m OMNIPAQUE IOHEXOL 300 MG/ML  SOLN COMPARISON:  December 18, 2017 FINDINGS: Lower chest: There is bibasilar atelectasis. There is mild bronchiectatic change in the lung bases as well. There are foci of coronary artery calcification. Hepatobiliary: There is hepatic steatosis. No focal liver lesions are evident. There is cholelithiasis. The gallbladder wall is diffusely thickened and edematous. There is loss of fat plane between  the gallbladder and the pylorus/first portion of the duodenum. There is no air within the gallbladder. There is no appreciable intrahepatic biliary duct dilatation. There is dilatation of the distal common bile duct, measuring 1.2 cm. No biliary duct mass or calculus evident. Pancreas: No pancreatic mass or inflammatory focus evident. No pancreatic duct dilatation. Spleen: No splenic lesions are evident. Adrenals/Urinary Tract: Adrenals bilaterally appear normal. There are subcentimeter cysts in the right kidney. There is an extrarenal pelvis on the right, an anatomic variant. There is no hydronephrosis on either side. There is or ureteral calculus on either side. Urinary bladder wall thickness is within normal limits. Stomach/Bowel: As noted above, there is loss of fat plane between the gallbladder and the pylorus/first portion of the duodenum. There is wall thickening in the distal stomach and proximal duodenum. There is no other appreciable bowel wall thickening. The [name] has had a partial right colectomy with anastomosis patent in this area. Terminal ileum appears unremarkable. No evident bowel obstruction. There is no free air or portal venous air. Vascular/Lymphatic: There is aortic and iliac artery atherosclerosis. No aneurysm evident. Major mesenteric arterial vessels appear patent, although there is severe narrowing at the origin of the right renal artery and extensive calcification with severe narrowing in the proximal left renal artery. No adenopathy is evident in the abdomen or pelvis. Reproductive: There are calcifications in the uterus, felt to be indicative of leiomyomatous change. The uterus is slightly canted toward the right. No pelvic mass is demonstrable. Other: Appendix absent. There is no abscess or ascites evident in the abdomen or pelvis. There is fat in each inguinal ring. Musculoskeletal: There are no blastic or lytic bone lesions. There is bony hypertrophy at L4 and diffuse disc  protrusion. These findings in concert leads spinal stenosis. Similar changes are noted at L3-4 with spinal stenosis also present at L3-4. No blastic or lytic bone lesions. No intramuscular lesions are evident. IMPRESSION: 1. There is cholelithiasis with diffuse gallbladder wall thickening and edema. There is loss of fat plane between the gallbladder and the distal stomach/proximal duodenum. 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 No air seen in the gallbladder. 2. There is dilatation of the common hepatic and common bile ducts. No intrahepatic biliary duct dilatation seen. No biliary duct mass or calculus evident. A mass at the ampulla could cause these findings. From an imaging standpoint, MRCP would be the optimum study of choice to further evaluate in this regard. 3.  Hepatic steatosis.  No focal liver lesions appreciable. 4. No bowel obstruction. No abscess in the abdomen or pelvis. The [name] is status  post partial right colectomy with anastomosis patent. 5. Spinal stenosis at L3-4 and L4-5 due to disc protrusions and bony hypertrophy at these levels. 6. Extensive calcification and narrowing in the proximal renal arteries bilaterally. Question whether [name] is hypertensive; there may well be renovascular hypertension given this appearance. 7.  Extensive aortic and iliac artery atherosclerosis. 8. Calcifications in the uterus consistent with leiomyomatous change. Electronically Signed   By: Lowella Grip III M.D.   On: 09/18/2018 14:26   Mr Abdomen Mrcp Wo Contrast  Result Date: 09/18/2018 CLINICAL DATA:  Abdominal pain/distension, Hemoccult-positive, history of breast cancer status post right mastectomy EXAM: MRI ABDOMEN WITHOUT CONTRAST  (INCLUDING MRCP) TECHNIQUE: Multiplanar multisequence MR imaging of the abdomen was performed. Heavily T2-weighted images of the biliary and pancreatic ducts were obtained, and three-dimensional MRCP images were rendered by post processing.  COMPARISON:  CT abdomen/pelvis dated 09/18/2018 FINDINGS: Motion degraded images. Lower chest: Lung bases are clear. Hepatobiliary: Suspected iron deposition in the liver. No focal hepatic lesion is seen. Cholelithiasis with gallbladder wall thickening/inflammatory changes. Associated 1.9 x 2.8 cm cystic lesion along the wall of the gallbladder (series 8/image 26) favors localized perforation. No intrahepatic or extrahepatic ductal dilatation. Pancreas:  Within normal limits. Spleen:  Within normal limits. Adrenals/Urinary Tract:  Adrenal glands are within normal limits. 8 mm posterior right upper pole renal cyst with mild right renal cortical scarring. Left kidney is within normal limits. No hydronephrosis. Stomach/Bowel: Stomach is within normal limits. Proximal duodenum is mildly thick-walled and adherent to the gallbladder (series 8/image 27). While direct fistulous communication is not demonstrated on MR, this may reflect involvement of the inflammatory process. Visualized bowel is otherwise unremarkable. Vascular/Lymphatic:  No evidence of abdominal aortic aneurysm. No suspicious abdominal lymphadenopathy. Other:  No abdominal ascites. Musculoskeletal: No focal osseous lesions. IMPRESSION: Cholelithiasis with acute cholecystitis. Associated 2.8 cm cystic lesion along the wall the gallbladder favors localized perforation. Adherent proximal duodenum with mild wall thickening suggest involvement with the right upper quadrant inflammatory process. Direct fistulous communication is not demonstrated on MR. Additional ancillary findings as above. Electronically Signed   By: Julian Hy M.D.   On: 09/18/2018 22:04   Dg Ercp Biliary & Pancreatic Ducts  Result Date: 10/03/2018 CLINICAL DATA:  Choledocholithiasis, biliary dilatation EXAM: ERCP with sphincterotomy and balloon extraction TECHNIQUE: Multiple spot images obtained with the fluoroscopic device and submitted for interpretation post-procedure.  FLUOROSCOPY TIME:  Fluoroscopy Time:  7 minutes 5 seconds Radiation Exposure Index (if provided by the fluoroscopic device): 127.74 COMPARISON:  10/02/2018 CT FINDINGS: Intraoperative cholangiogram during the ERCP demonstrate biliary dilatation and filling defects throughout the common bile duct without obstruction. Guidewire access obtained. Balloon extraction performed for removal of the duct stones, debris and/or sludge. Please refer to the ERCP for details of the procedure. IMPRESSION: Limited imaging during ERCP with sphincterotomy and balloon extraction to remove sludge and clots from the common bile duct as described in the ERCP report. These images were submitted for radiologic interpretation only. Please see the procedural report for the amount of contrast and the fluoroscopy time utilized. Electronically Signed   By: Jerilynn Mages.  Shick M.D.   On: 10/03/2018 16:09   Dg Abdomen Acute W/chest  Result Date: 09/18/2018 CLINICAL DATA:  Acute generalized abdominal pain. EXAM: DG ABDOMEN ACUTE W/ 1V CHEST COMPARISON:  Radiographs of April 10, 2018 and February 27, 2015. FINDINGS: There is no evidence of dilated bowel loops or free intraperitoneal air. No radiopaque calculi or other significant radiographic abnormality is seen. Stable  cardiomediastinal silhouette is noted. Atherosclerosis of thoracic aorta is noted. Left internal jugular Port-A-Cath is noted in grossly good position. Both lungs are clear. IMPRESSION: No evidence of bowel obstruction or ileus. No acute cardiopulmonary disease. Aortic Atherosclerosis (ICD10-I70.0). Electronically Signed   By: Marijo Conception M.D.   On: 09/18/2018 13:24   US Abdomen Limited Ruq  Result Date: 10/02/2018 CLINICAL DATA:  Severe abdominal pain EXAM: ULTRASOUND ABDOMEN LIMITED RIGHT UPPER QUADRANT COMPARISON:  Ultrasound right upper quadrant September 19, 2018; CT abdomen and pelvis September 18 2018 and abdominal MRI September 18, 2018 FINDINGS: Gallbladder: There is a 1.7 cm  calcification within the gallbladder consistent with a large gallstone. The gallbladder wall remains thickened edematous with localized fluid which may represent loculated perforation of the gallbladder. [name] is focally tender over the gallbladder. Common bile duct: Diameter: 7 mm. There is slight intrahepatic biliary duct dilatation. Liver: No focal lesion identified. Within normal limits in parenchymal echogenicity. Portal vein is patent on color Doppler imaging with normal direction of blood flow towards the liver. Other: None. IMPRESSION: Findings in the gallbladder indicative of acute cholecystitis with questionable loculated area of perforation. Common bile duct upper normal in size. Slight degree of intrahepatic biliary duct dilatation felt to be present. Electronically Signed   By: Lowella Grip III M.D.   On: 10/02/2018 11:38   US Abdomen Limited Ruq  Result Date: 09/19/2018 CLINICAL DATA:  Cholecystitis EXAM: ULTRASOUND ABDOMEN LIMITED RIGHT UPPER QUADRANT COMPARISON:  MRI September 18, 2018, CT September 18, 2018. FINDINGS: Gallbladder: There is a large echogenic mass seen on the peripheral outer wall of the gallbladder with posterior shadowing, likely calcified mass as seen on prior MRI and CT exam. This measures 1.4 by 1.5 x 1.2 cm. There is diffuse gallbladder wall thickening measuring 6 mm. No definite pericholecystic fluid is seen. No sonographic Percell Miller sign is seen. Common bile duct: Diameter: 4 mm. Liver: No focal lesion identified. Within normal limits in parenchymal echogenicity. Portal vein is patent on color Doppler imaging with normal direction of blood flow towards the liver. Other: None. IMPRESSION: 1. Large calcified mass seen on the peripheral outer wall of the gallbladder measuring 1.4 x 1.5 x 1.2 cm with diffuse thickened gallbladder wall. Findings suggestive of cholecystitis. 2. Normal appearing liver. 3. No extrahepatic biliary ductal dilatation. Electronically Signed   By: Prudencio Pair M.D.   On: 09/19/2018 18:11    Lab Data:  CBC: Recent Labs  Lab 10/02/18 1020 10/04/18 1001 10/05/18 0500 10/05/18 1024 10/05/18 1700 10/06/18 0352  WBC 6.8 5.5 4.1  --   --   --   HGB 10.6* 8.7* 7.2* 7.6* 7.3* 7.7*  HCT 33.6* 28.4* 24.0* 25.3* 24.2* 25.2*  MCV 100.0 101.4* 103.0*  --   --   --   PLT 363 266 234  --   --   --    Basic Metabolic Panel: Recent Labs  Lab 10/02/18 1020 10/03/18 1714 10/05/18 0500 10/06/18 0352  NA 140 139 142 143  K 3.4* 3.8 3.3* 3.6  CL 110 106 112* 112*  CO2 20* 23 23 23   GLUCOSE 115* 118* 113* 95  BUN 15 24* 18 11  CREATININE 1.37* 1.72* 1.49* 1.49*  CALCIUM 8.4* 8.5* 8.5* 9.1  MG  --   --  1.5* 1.6*   GFR: Estimated Creatinine Clearance: 27 mL/min (A) (by C-G formula based on SCr of 1.49 mg/dL (H)). Liver Function Tests: Recent Labs  Lab 10/02/18 1020 10/03/18 1714 10/05/18  0500 10/06/18 0352  AST 280* 186* 277* 180*  ALT 267* 242* 229* 247*  ALKPHOS 879* 909* 758* 773*  BILITOT 3.7* 3.2* 2.0* 1.5*  PROT 7.1 7.4 6.4* 6.9  ALBUMIN 3.2* 3.0* 2.7* 2.9*   Recent Labs  Lab 10/02/18 1020  LIPASE 82*   No results for input(s): AMMONIA in the last 168 hours. Coagulation Profile: Recent Labs  Lab 10/03/18 1714  INR 1.0   Cardiac Enzymes: No results for input(s): CKTOTAL, CKMB, CKMBINDEX, TROPONINI in the last 168 hours. BNP (last 3 results) No results for input(s): PROBNP in the last 8760 hours. HbA1C: No results for input(s): HGBA1C in the last 72 hours. CBG: Recent Labs  Lab 10/05/18 0737 10/05/18 1137 10/05/18 1638 10/05/18 2031 10/06/18 0804  GLUCAP 113* 226* 84 86 107*   Lipid Profile: No results for input(s): CHOL, HDL, LDLCALC, TRIG, CHOLHDL, LDLDIRECT in the last 72 hours. Thyroid Function Tests: No results for input(s): TSH, T4TOTAL, FREET4, T3FREE, THYROIDAB in the last 72 hours. Anemia Panel: No results for input(s): VITAMINB12, FOLATE, FERRITIN, TIBC, IRON, RETICCTPCT in the last 72  hours. Urine analysis:    Component Value Date/Time   COLORURINE YELLOW 11/27/2015 0743   APPEARANCEUR CLOUDY (A) 11/27/2015 0743   LABSPEC 1.019 11/27/2015 0743   PHURINE 6.0 11/27/2015 0743   GLUCOSEU NEGATIVE 11/27/2015 0743   HGBUR NEGATIVE 11/27/2015 0743   BILIRUBINUR NEGATIVE 11/27/2015 0743   KETONESUR NEGATIVE 11/27/2015 0743   PROTEINUR NEGATIVE 11/27/2015 0743   UROBILINOGEN 0.2 03/31/2014 2017   NITRITE NEGATIVE 11/27/2015 0743   LEUKOCYTESUR MODERATE (A) 11/27/2015 0743   Medications  Scheduled Meds: . ALPRAZolam  0.25 mg Oral QHS  . amLODipine  5 mg Oral Daily  . enoxaparin (LOVENOX) injection  30 mg Subcutaneous Q24H  . indomethacin  100 mg Rectal Once  . insulin aspart  0-5 Units Subcutaneous QHS  . insulin aspart  0-9 Units Subcutaneous TID WC  . metoprolol succinate  25 mg Oral Daily  . mometasone-formoterol  2 puff Inhalation BID   Continuous Infusions: . sodium chloride 75 mL/hr at 10/04/18 0600  . sodium chloride Stopped (10/04/18 0344)  . piperacillin-tazobactam (ZOSYN)  IV 12.5 mL/hr at 10/04/18 0600   PRN Meds:.sodium chloride, acetaminophen, albuterol, hydrALAZINE, HYDROmorphone (DILAUDID) injection, nitroGLYCERIN, ondansetron (ZOFRAN) IV   Flora Lipps M.D. [facility] 10/06/2018, 9:35 AM

## 2018-10-06 NOTE — Progress Notes (Signed)
3 Days Post-Op  Subjective: CC: Doing well. Patient reports that she has had no abdominal pain, N/V for the last 24 hours. She does not like FLD options and would like her diet advanced. She notes she is hungry.   Objective: Vital signs in last 24 hours: Temp:  [98.2 F (36.8 C)-99.1 F (37.3 C)] 98.3 F (36.8 C) (08/31 0415) Pulse Rate:  [57-69] 69 (08/31 0415) Resp:  [17-18] 18 (08/31 0415) BP: (121-138)/(69-73) 129/73 (08/31 0415) SpO2:  [94 %-100 %] 99 % (08/31 0816) Last BM Date: 10/05/18  Intake/Output from previous day: 08/30 0701 - 08/31 0700 In: 2570.9 [P.O.:480; I.V.:1863.4; IV Piggyback:227.6] Out: 1403 [Urine:1400; Stool:3] Intake/Output this shift: No intake/output data recorded.  PE: Gen:  Alert, NAD, pleasant Lungs: Normal rate and effort  Abd: Soft, NT/ND, +BS Skin: no rashes noted, warm and dry  Lab Results:  Recent Labs    10/04/18 1001 10/05/18 0500  10/05/18 1700 10/06/18 0352  WBC 5.5 4.1  --   --   --   HGB 8.7* 7.2*   < > 7.3* 7.7*  HCT 28.4* 24.0*   < > 24.2* 25.2*  PLT 266 234  --   --   --    < > = values in this interval not displayed.   BMET Recent Labs    10/05/18 0500 10/06/18 0352  NA 142 143  K 3.3* 3.6  CL 112* 112*  CO2 23 23  GLUCOSE 113* 95  BUN 18 11  CREATININE 1.49* 1.49*  CALCIUM 8.5* 9.1   PT/INR Recent Labs    10/03/18 1714  LABPROT 13.2  INR 1.0   CMP     Component Value Date/Time   NA 143 10/06/2018 0352   K 3.6 10/06/2018 0352   CL 112 (H) 10/06/2018 0352   CO2 23 10/06/2018 0352   GLUCOSE 95 10/06/2018 0352   BUN 11 10/06/2018 0352   CREATININE 1.49 (H) 10/06/2018 0352   CREATININE 2.27 (H) 08/14/2018 1250   CALCIUM 9.1 10/06/2018 0352   PROT 6.9 10/06/2018 0352   ALBUMIN 2.9 (L) 10/06/2018 0352   AST 180 (H) 10/06/2018 0352   AST 15 08/14/2018 1250   ALT 247 (H) 10/06/2018 0352   ALT 16 08/14/2018 1250   ALKPHOS 773 (H) 10/06/2018 0352   BILITOT 1.5 (H) 10/06/2018 0352   BILITOT  0.4 08/14/2018 1250   GFRNONAA 35 (L) 10/06/2018 0352   GFRNONAA 21 (L) 08/14/2018 1250   GFRAA 41 (L) 10/06/2018 0352   GFRAA 25 (L) 08/14/2018 1250   Lipase     Component Value Date/Time   LIPASE 82 (H) 10/02/2018 1020       Studies/Results: No results found.  Anti-infectives: Anti-infectives (From admission, onward)   Start     Dose/Rate Route Frequency Ordered Stop   10/02/18 2000  piperacillin-tazobactam (ZOSYN) IVPB 3.375 g     3.375 g 12.5 mL/hr over 240 Minutes Intravenous Every 8 hours 10/02/18 1809     10/02/18 1030  cefTRIAXone (ROCEPHIN) 2 g in sodium chloride 0.9 % 100 mL IVPB     2 g 200 mL/hr over 30 Minutes Intravenous  Once 10/02/18 1026 10/02/18 1130   10/02/18 1030  metroNIDAZOLE (FLAGYL) IVPB 500 mg     500 mg 100 mL/hr over 60 Minutes Intravenous  Once 10/02/18 1026 10/02/18 1235       Assessment/Plan CAD Hx of MI with stenting Hx CVA HTN HLD IDDM CKD - Cr 1.49 Anemia - 7.3  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 She was treated with IV antibioticsand her symptoms improved.She was discharged home with plans for follow-up with Dr. Ninfa Linden in the office in 6-8 weeks to consider interval cholecystectomy. Unfortunately her symptoms worsened and she returned.  - S/p ERCP with Sphincterotomy - 8/28 - WBC has normalized. T bili trending down.  - Currently symptom free. Advance diet.  - Continue IV abx. Hopefully will continue to improve again on abx and be able to be d/c'd. She is still high risk for surgical complication.  If she needs her GB surgery during this admission, she will likely need to be transferred to a tertiary care center for this.   FEN - HH diet VTE - SCDs ID - [email or web address]   LOS: 4 days    Jillyn Ledger , [facility] 10/06/2018, 8:40 AM Pager: [phone]

## 2018-10-07 LAB — CBC
HCT: 27 % — ABNORMAL LOW (ref 36.0–46.0)
Hemoglobin: 8.2 g/dL — ABNORMAL LOW (ref 12.0–15.0)
MCH: 31.7 pg (ref 26.0–34.0)
MCHC: 30.4 g/dL (ref 30.0–36.0)
MCV: 104.2 fL — ABNORMAL HIGH (ref 80.0–100.0)
Platelets: 257 10*3/uL (ref 150–400)
RBC: 2.59 MIL/uL — ABNORMAL LOW (ref 3.87–5.11)
RDW: 16.5 % — ABNORMAL HIGH (ref 11.5–15.5)
WBC: 4.7 10*3/uL (ref 4.0–10.5)
nRBC: 0.6 % — ABNORMAL HIGH (ref 0.0–0.2)

## 2018-10-07 LAB — COMPREHENSIVE METABOLIC PANEL
ALT: 175 U/L — ABNORMAL HIGH (ref 0–44)
AST: 62 U/L — ABNORMAL HIGH (ref 15–41)
Albumin: 3.1 g/dL — ABNORMAL LOW (ref 3.5–5.0)
Alkaline Phosphatase: 647 U/L — ABNORMAL HIGH (ref 38–126)
Anion gap: 9 (ref 5–15)
BUN: 11 mg/dL (ref 8–23)
CO2: 23 mmol/L (ref 22–32)
Calcium: 9.2 mg/dL (ref 8.9–10.3)
Chloride: 110 mmol/L (ref 98–111)
Creatinine, Ser: 1.59 mg/dL — ABNORMAL HIGH (ref 0.44–1.00)
GFR calc Af Amer: 38 mL/min — ABNORMAL LOW (ref >60)
GFR calc non Af Amer: 33 mL/min — ABNORMAL LOW (ref >60)
Glucose, Bld: 110 mg/dL — ABNORMAL HIGH (ref 70–99)
Potassium: 3.6 mmol/L (ref 3.5–5.1)
Sodium: 142 mmol/L (ref 135–145)
Total Bilirubin: 1 mg/dL (ref 0.3–1.2)
Total Protein: 7.1 g/dL (ref 6.5–8.1)

## 2018-10-07 LAB — GLUCOSE, CAPILLARY
Glucose-Capillary: 130 mg/dL — ABNORMAL HIGH (ref 70–99)
Glucose-Capillary: 353 mg/dL — ABNORMAL HIGH (ref 70–99)

## 2018-10-07 MED ORDER — AMOXICILLIN-POT CLAVULANATE 875-125 MG PO TABS: 1.0000 | ORAL_TABLET | Freq: Two times a day (BID) | ORAL | 0 refills | Status: AC

## 2018-10-07 MED ORDER — PANTOPRAZOLE SODIUM 40 MG PO TBEC: 40.0000 mg | DELAYED_RELEASE_TABLET | Freq: Every day | ORAL | 0 refills | Status: DC

## 2018-10-07 MED ORDER — MAGNESIUM OXIDE 400 (241.3 MG) MG PO TABS: 400.0000 mg | ORAL_TABLET | Freq: Two times a day (BID) | ORAL | 0 refills | Status: DC

## 2018-10-07 MED ORDER — HEPARIN SOD (PORK) LOCK FLUSH 100 UNIT/ML IV SOLN
500.0000 [IU] | INTRAVENOUS | Status: AC | PRN
  Administered 2018-10-07: 13:00:00 500 [IU]

## 2018-10-07 MED ORDER — AMOXICILLIN-POT CLAVULANATE 875-125 MG PO TABS: 1.0000 | ORAL_TABLET | Freq: Two times a day (BID) | ORAL | 0 refills | Status: DC

## 2018-10-07 MED ORDER — ASPIRIN 81 MG PO TBEC: 81.0000 mg | DELAYED_RELEASE_TABLET | Freq: Every day | ORAL | 0 refills | Status: DC

## 2018-10-07 NOTE — Progress Notes (Signed)
4 Days Post-Op  Subjective: CC: Doing well.  Reports that she was happy her diet was advanced yesterday.  She had meatloaf, mashed potatoes and string beans for dinner yesterday.  She reports no associated abdominal pain, nausea, vomiting after eating.  She reports she has been symptom-free since I saw her yesterday.  She is asking when she will be able to be discharged.  Objective: Vital signs in last 24 hours: Temp:  [98.3 F (36.8 C)-98.9 F (37.2 C)] 98.3 F (36.8 C) (09/01 0437) Pulse Rate:  [63-68] 65 (09/01 0437) Resp:  [18-20] 18 (09/01 0437) BP: (103-136)/(66-72) 117/68 (09/01 0437) SpO2:  [98 %-100 %] 98 % (09/01 0752) Last BM Date: 10/07/18  Intake/Output from previous day: 08/31 0701 - 09/01 0700 In: 360 [P.O.:360] Out: 901 [Urine:900; Stool:1] Intake/Output this shift: No intake/output data recorded.  PE: Gen:  Alert, NAD, pleasant Lungs: Normal rate and effort  Abd: Soft, NT/ND, +BS Skin: no rashes noted, warm and dry  Lab Results:  Recent Labs    10/05/18 0500  10/06/18 0352 10/07/18 0435  WBC 4.1  --   --  4.7  HGB 7.2*   < > 7.7* 8.2*  HCT 24.0*   < > 25.2* 27.0*  PLT 234  --   --  257   < > = values in this interval not displayed.   BMET Recent Labs    10/06/18 0352 10/07/18 0435  NA 143 142  K 3.6 3.6  CL 112* 110  CO2 23 23  GLUCOSE 95 110*  BUN 11 11  CREATININE 1.49* 1.59*  CALCIUM 9.1 9.2   PT/INR No results for input(s): LABPROT, INR in the last 72 hours. CMP     Component Value Date/Time   NA 142 10/07/2018 0435   K 3.6 10/07/2018 0435   CL 110 10/07/2018 0435   CO2 23 10/07/2018 0435   GLUCOSE 110 (H) 10/07/2018 0435   BUN 11 10/07/2018 0435   CREATININE 1.59 (H) 10/07/2018 0435   CREATININE 2.27 (H) 08/14/2018 1250   CALCIUM 9.2 10/07/2018 0435   PROT 7.1 10/07/2018 0435   ALBUMIN 3.1 (L) 10/07/2018 0435   AST 62 (H) 10/07/2018 0435   AST 15 08/14/2018 1250   ALT 175 (H) 10/07/2018 0435   ALT 16 08/14/2018  1250   ALKPHOS 647 (H) 10/07/2018 0435   BILITOT 1.0 10/07/2018 0435   BILITOT 0.4 08/14/2018 1250   GFRNONAA 33 (L) 10/07/2018 0435   GFRNONAA 21 (L) 08/14/2018 1250   GFRAA 38 (L) 10/07/2018 0435   GFRAA 25 (L) 08/14/2018 1250   Lipase     Component Value Date/Time   LIPASE 82 (H) 10/02/2018 1020       Studies/Results: No results found.  Anti-infectives: Anti-infectives (From admission, onward)   Start     Dose/Rate Route Frequency Ordered Stop   10/02/18 2000  piperacillin-tazobactam (ZOSYN) IVPB 3.375 g     3.375 g 12.5 mL/hr over 240 Minutes Intravenous Every 8 hours 10/02/18 1809     10/02/18 1030  cefTRIAXone (ROCEPHIN) 2 g in sodium chloride 0.9 % 100 mL IVPB     2 g 200 mL/hr over 30 Minutes Intravenous  Once 10/02/18 1026 10/02/18 1130   10/02/18 1030  metroNIDAZOLE (FLAGYL) IVPB 500 mg     500 mg 100 mL/hr over 60 Minutes Intravenous  Once 10/02/18 1026 10/02/18 1235       Assessment/Plan CAD Hx of MI with stenting Hx CVA HTN HLD IDDM  CKD - Cr 1.59 Anemia - up to 8.2 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 She was treated with IV antibioticsand her symptoms improved.She was discharged home with plans for follow-up with Dr. Ninfa Linden in the office in 6-8 weeks to consider interval cholecystectomy. Unfortunately her symptoms worsened and she returned.  - S/p ERCP with Sphincterotomy - 8/28 -From our standpoint patient appears to have improved on antibiotics.  She is tolerating a solid diet without any symptoms.  White blood cell  has normalized and LFTs are trending down appropriately.  Feel is appropriate to discharge her from our standpoint on a total of 14 days of antibiotics.  Will have her follow-up with Dr. Ninfa Linden  FEN - HH diet VTE - SCDs, okay for chemical prophylaxis from our standpoint ID - Zosyn   LOS: 5 days    Jillyn Ledger , [facility] 10/07/2018, 8:46 AM Pager: [phone]

## 2018-10-07 NOTE — Plan of Care (Signed)
  Problem: Health Behavior/Discharge Planning: Goal: Ability to manage health-related needs will improve Outcome: Progressing   Problem: Activity: Goal: Risk for activity intolerance will decrease Outcome: Progressing   Problem: Nutrition: Goal: Adequate nutrition will be maintained Outcome: Progressing   Problem: Coping: Goal: Level of anxiety will decrease Outcome: Progressing   

## 2018-10-07 NOTE — Discharge Summary (Signed)
Physician Discharge Summary  Mirtha Jain EAV:[id number] DOB: September 06, 1950 DOA: 10/02/2018  PCP: Tsosie Billing, MD (Inactive)  Admit date: 10/02/2018 Discharge date: 10/07/2018  Admitted From: Home  Discharge disposition: Home   Recommendations for Outpatient Follow-Up:    Follow up with your primary care provider in one week.  Follow-up with Dr. Ninfa Linden general surgery after discharge  Discharge Diagnosis:   Active Problems:   Choledocholithiasis   Dilated bile duct   Jaundice   Elevated LFTs   Hemobilia    Discharge Condition: Improved.  Diet recommendation: Low sodium, heart healthy.  Low-fat diet.  Wound care: None.  Code status: Full.   History of Present Illness:   Dominique Drawdy Jonesis a 68 y.o.femalewith medical history significant ofchronic cholelithiasis, coronary artery disease, asthma, GERD, hypertension, hyperlipidemia, iron deficiency anemia was recently discharged from the hospital (8/13-8/20) after being treated for acute cholecystitis with IV antibiotics and conservative management presented to the hospital. She again presented with persistent abdominal pain for 2 to 3 days associated with some nausea and dry heaving.  On the previous admission, CA-19-9 44, AFP was normal.  At that time, general surgery was consulted, patient was unable to have PERC drain as gallbladder was contracted.  Patient was felt high risk for injury to CBD or bowel injury so was conservatively managed with IV antibiotics and discharged home with plans to follow-up with Dr. Ninfa Linden in the office in 6 to 8 weeks for interval cholecystectomy.   In the ED on this presentation, patient was afebrile, tachypneic, hypertensive, BP 184/100, creatinine 1.3, alk phos 879, lipase 82, AST 280, ALT 267. CT of the abdomen and pelvis showed cholelithiasis with gallbladder wall thickening representing acute cholecystitis, biliary dilatation measuring up to 14 mm, increased  density in CBD may represent sludge versus choledocholithiasis.  Patient was then readmitted to the hospital.   Hospital Course:   Acute cholecystitis with acute choledocholithiasis with elevated LFTs   Surgery and GI consulted during hospitalization.  Patient underwent ERCP with findings of sludge and clots and sphincterectomy was done.    Patient received IV Zosyn during hospitalization and will be continued on oral antibiotic on discharge to complete the course.  Trending down LFTs.  Patient did not have further episodes of abdominal pain and tolerated her diet.  He did have some dark stool from hemobilia but her hemoglobin remained stable.  She will be prescribed Protonix on discharge. Surgery felt that the hemobilia was likely from cholelithiasis, cholecystitis.   Melena- dark stool likely from hemobilia- status post GI intervention.   Hemoglobin of 8.2 on the day of discharge.  No active bleeding.  Hematocrit remained stable.  Essential hypertension Continue Norvasc, metoprolol.     chronic CKD stage III  -Baseline creatinine ~1.5.  Creatinine was 1.5 at the time of discharge.  History of asthma Remained compensated.    Diabetes mellitus type 2, IDDM Patient will resume insulin regimen on discharge  Mild hypokalemia.    Replenished.  Mild hypomagnesemia.  Replenished  CAD status post stent in 2013 No acute issues.  .  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 Sonny Dandy, on letrozole and Herceptin as outpatient.  Disposition.  At this time, patient is stable for disposition home.  She was seen by physical therapy and surgery prior to discharge.  I have also spoken with the patient's sister on the phone and updated her  about the changes in the medication and the need for outpatient follow-up with surgery.   Medical Consultants:   General  surgery Gastroenterology  Subjective:   Today, patient feels okay.  Denies any pain, nausea, vomiting.  Has been tolerating oral diet.  Still has some dark stool.  Discharge Exam:   Vitals:   10/07/18 0437 10/07/18 0752  BP: 117/68   Pulse: 65   Resp: 18   Temp: 98.3 F (36.8 C)   SpO2: 100% 98%   Vitals:   10/06/18 1915 10/06/18 2108 10/07/18 0437 10/07/18 0752  BP:  103/66 117/68   Pulse:  63 65   Resp:  20 18   Temp:  98.9 F (37.2 C) 98.3 F (36.8 C)   TempSrc:  Oral Oral   SpO2: 98% 100% 100% 98%  Weight:      Height:        General exam: Appears calm and comfortable ,Not in distress HEENT:PERRL,Oral mucosa moist.  Mild pallor noted. Respiratory system: Bilateral equal air entry, normal vesicular breath sounds, no wheezes or crackles  Cardiovascular system: S1 & S2 heard, RRR.  Left chest wall Port-A-Cath in place. Gastrointestinal system: Abdomen is nondistended, soft and nontender. Normal bowel sounds heard. Central nervous system: Alert and oriented. No focal neurological deficits. Extremities: No edema, no clubbing ,no cyanosis, distal peripheral pulses palpable. Skin: No rashes, lesions or ulcers,no icterus ,no pallor MSK: Normal muscle bulk,tone ,power    Procedures:    ERCP on 8/28 with sphincterectomy  The results of significant diagnostics from this hospitalization (including imaging, microbiology, ancillary and laboratory) are listed below for reference.     Diagnostic Studies:   Ct Abdomen Pelvis W Contrast  Result Date: 10/02/2018 CLINICAL DATA:  68 year old female with acute abdominal and pelvic pain and nausea. EXAM: CT ABDOMEN AND PELVIS WITH CONTRAST TECHNIQUE: Multidetector CT imaging of the abdomen and pelvis was performed using the standard protocol following bolus administration of intravenous contrast. CONTRAST:  29m OMNIPAQUE IOHEXOL 300 MG/ML  SOLN COMPARISON:  09/18/2018 CT FINDINGS: Lower chest: Bibasilar atelectasis is identified.  Cardiomegaly again noted. Hepatobiliary: Cholelithiasis with gallbladder wall thickening again noted. There is intrahepatic and extrahepatic biliary dilatation to the ampulla with the CBD measuring up to 14 mm in greatest diameter. Slight increased density within the visualized CBD is noted and may represent sludge versus choledocholithiasis. Pancreas: Unremarkable Spleen: Unremarkable Adrenals/Urinary Tract: The kidneys, adrenal glands and bladder are unremarkable except for unchanged bilateral renal cortical thinning and probable tiny bilateral renal cysts. There is no evidence of hydronephrosis or obstructing urinary calculi. Stomach/Bowel: Stomach is within normal limits. Surgical changes along the RIGHT colon again noted. No evidence of bowel obstruction. No evidence of bowel wall thickening, distention, or inflammatory changes. Vascular/Lymphatic: Aortic atherosclerosis. No enlarged abdominal or pelvic lymph nodes. Reproductive: Unremarkable except for a stable calcified RIGHT uterine fibroid. Other: No ascites, focal collection or pneumoperitoneum. Small bilateral inguinal hernias containing fat are again noted. Musculoskeletal: No acute or suspicious bony abnormality. IMPRESSION: 1. Cholelithiasis with gallbladder wall thickening likely representing acute cholecystitis. Intrahepatic and extrahepatic biliary dilatation to the ampulla with the CBD now measuring 14 mm. Increased density within the CBD may represent sludge versus choledocholithiasis. 2. Cardiomegaly and bibasilar atelectasis. 3. Unchanged small bilateral inguinal hernias containing fat. 4.  Aortic Atherosclerosis (ICD10-I70.0). Electronically Signed   By: JMargarette CanadaM.D.   On: 10/02/2018 14:21   Dg Ercp Biliary & Pancreatic Ducts  Result Date: 10/03/2018 CLINICAL DATA:  Choledocholithiasis, biliary dilatation  EXAM: ERCP with sphincterotomy and balloon extraction TECHNIQUE: Multiple spot images obtained with the fluoroscopic device and  submitted for interpretation post-procedure. FLUOROSCOPY TIME:  Fluoroscopy Time:  7 minutes 5 seconds Radiation Exposure Index (if provided by the fluoroscopic device): 127.74 COMPARISON:  10/02/2018 CT FINDINGS: Intraoperative cholangiogram during the ERCP demonstrate biliary dilatation and filling defects throughout the common bile duct without obstruction. Guidewire access obtained. Balloon extraction performed for removal of the duct stones, debris and/or sludge. Please refer to the ERCP for details of the procedure. IMPRESSION: Limited imaging during ERCP with sphincterotomy and balloon extraction to remove sludge and clots from the common bile duct as described in the ERCP report. These images were submitted for radiologic interpretation only. Please see the procedural report for the amount of contrast and the fluoroscopy time utilized. Electronically Signed   By: Jerilynn Mages.  Shick M.D.   On: 10/03/2018 16:09   US Abdomen Limited Ruq  Result Date: 10/02/2018 CLINICAL DATA:  Severe abdominal pain EXAM: ULTRASOUND ABDOMEN LIMITED RIGHT UPPER QUADRANT COMPARISON:  Ultrasound right upper quadrant September 19, 2018; CT abdomen and pelvis September 18 2018 and abdominal MRI September 18, 2018 FINDINGS: Gallbladder: There is a 1.7 cm calcification within the gallbladder consistent with a large gallstone. The gallbladder wall remains thickened edematous with localized fluid which may represent loculated perforation of the gallbladder. Patient is focally tender over the gallbladder. Common bile duct: Diameter: 7 mm. There is slight intrahepatic biliary duct dilatation. Liver: No focal lesion identified. Within normal limits in parenchymal echogenicity. Portal vein is patent on color Doppler imaging with normal direction of blood flow towards the liver. Other: None. IMPRESSION: Findings in the gallbladder indicative of acute cholecystitis with questionable loculated area of perforation. Common bile duct upper normal in size.  Slight degree of intrahepatic biliary duct dilatation felt to be present. Electronically Signed   By: Lowella Grip III M.D.   On: 10/02/2018 11:38     Labs:   Basic Metabolic Panel: Recent Labs  Lab 10/02/18 1020 10/03/18 1714 10/05/18 0500 10/06/18 0352 10/07/18 0435  NA 140 139 142 143 142  K 3.4* 3.8 3.3* 3.6 3.6  CL 110 106 112* 112* 110  CO2 20* _0 GLUCOSE 115* 118* 113* 95 110*  BUN 15 24* _1 CREATININE 1.37* 1.72* 1.49* 1.49* 1.59*  CALCIUM 8.4* 8.5* 8.5* 9.1 9.2  MG  --   --  1.5* 1.6*  --    GFR Estimated Creatinine Clearance: 25.3 mL/min (A) (by C-G formula based on SCr of 1.59 mg/dL (H)). Liver Function Tests: Recent Labs  Lab 10/02/18 1020 10/03/18 1714 10/05/18 0500 10/06/18 0352 10/07/18 0435  AST 280* 186* 277* 180* 62*  ALT 267* 242* 229* 247* 175*  ALKPHOS 879* 909* 758* 773* 647*  BILITOT 3.7* 3.2* 2.0* 1.5* 1.0  PROT 7.1 7.4 6.4* 6.9 7.1  ALBUMIN 3.2* 3.0* 2.7* 2.9* 3.1*   Recent Labs  Lab 10/02/18 1020  LIPASE 82*   No results for input(s): AMMONIA in the last 168 hours. Coagulation profile Recent Labs  Lab 10/03/18 1714  INR 1.0    CBC: Recent Labs  Lab 10/02/18 1020 10/04/18 1001 10/05/18 0500 10/05/18 1024 10/05/18 1700 10/06/18 0352 10/07/18 0435  WBC 6.8 5.5 4.1  --   --   --  4.7  HGB 10.6* 8.7* 7.2* 7.6* 7.3* 7.7* 8.2*  HCT 33.6* 28.4* 24.0* 25.3* 24.2* 25.2* 27.0*  MCV 100.0 101.4* 103.0*  --   --   --  104.2*  PLT 363 266 234  --   --   --  257   Cardiac Enzymes: No results for input(s): CKTOTAL, CKMB, CKMBINDEX, TROPONINI in the last 168 hours. BNP: Invalid input(s): POCBNP CBG: Recent Labs  Lab 10/06/18 1159 10/06/18 1624 10/06/18 2111 10/07/18 0738 10/07/18 1106  GLUCAP 160* 198* 184* 130* 353*   D-Dimer No results for input(s): DDIMER in the last 72 hours. Hgb A1c No results for input(s): HGBA1C in the last 72 hours. Lipid Profile No results for input(s): CHOL, HDL, LDLCALC,  TRIG, CHOLHDL, LDLDIRECT in the last 72 hours. Thyroid function studies No results for input(s): TSH, T4TOTAL, T3FREE, THYROIDAB in the last 72 hours.  Invalid input(s): FREET3 Anemia work up No results for input(s): VITAMINB12, FOLATE, FERRITIN, TIBC, IRON, RETICCTPCT in the last 72 hours. Microbiology Recent Results (from the past 240 hour(s))  SARS CORONAVIRUS 2 (TAT 6-12 HRS) Nasal Swab Aptima Multi Swab     Status: None   Collection Time: 10/02/18  2:34 PM   Specimen: Aptima Multi Swab; Nasal Swab  Result Value Ref Range Status   SARS Coronavirus 2 NEGATIVE NEGATIVE Final    Comment: (NOTE) SARS-CoV-2 target nucleic acids are NOT DETECTED. The SARS-CoV-2 RNA is generally detectable in upper and lower respiratory specimens during the acute phase of infection. Negative results do not preclude SARS-CoV-2 infection, do not rule out co-infections with other pathogens, and should not be used as the sole basis for treatment or other patient management decisions. Negative results must be combined with clinical observations, patient history, and epidemiological information. The expected result is Negative. Fact Sheet for Patients: SugarRoll.be Fact Sheet for Healthcare Providers: https://www.woods-mathews.com/ This test is not yet approved or cleared by the [place] FDA and  has been authorized for detection and/or diagnosis of SARS-CoV-2 by FDA under an Emergency Use Authorization (EUA). This EUA will remain  in effect (meaning this test can be used) for the duration of the COVID-19 declaration under Section 56 4(b)(1) of the Act, 21 U.S.C. section 360bbb-3(b)(1), unless the authorization is terminated or revoked sooner. Performed at [facility], [place] [address], [place], [place] [address]      Discharge Instructions:   Discharge Instructions    Diet - low sodium heart healthy   Complete by: As directed    Avoid fatty  fried foods,   Discharge instructions   Complete by: As directed    Complete the course of antibiotic.  Please follow-up with your primary care physician in 1 week. Follow-up with Dr. Ninfa Linden as has been scheduled.   Increase activity slowly   Complete by: As directed      Allergies as of 10/07/2018   No Known Allergies     Medication List    TAKE these medications   acetaminophen 325 MG tablet Commonly known as: TYLENOL Take 2 tablets (650 mg total) by mouth every 4 (four) hours as needed for headache or mild pain.   albuterol (2.5 MG/3ML) 0.083% nebulizer solution Commonly known as: PROVENTIL INHALE THE CONTENTS OF 1 VIAL USING NEBULIZER EVERY 6 HOURS AS NEEDEDFOR WHEEZING OR SHORTNESS OF BREATH What changed: See the new instructions.   allopurinol 100 MG tablet Commonly known as: ZYLOPRIM Take 100 mg by mouth daily.   ALPRAZolam 0.25 MG tablet Commonly known as: XANAX Take 0.25 mg by mouth at bedtime.   amLODipine 5 MG tablet Commonly known as: NORVASC Take 5 mg by mouth daily.   amoxicillin-clavulanate 875-125 MG tablet Commonly known  as: Augmentin Take 1 tablet by mouth 2 (two) times daily for 9 days.   anastrozole 1 MG tablet Commonly known as: ARIMIDEX Take 1 tablet (1 mg total) by mouth daily.   aspirin 81 MG EC tablet Take 1 tablet (81 mg total) by mouth daily. Start taking on: October 13, 2018 What changed: These instructions start on October 13, 2018. If you are unsure what to do until then, ask your doctor or other care provider.   atorvastatin 20 MG tablet Commonly known as: LIPITOR Take 20 mg by mouth daily at 6 PM.   budesonide-formoterol 160-4.5 MCG/ACT inhaler Commonly known as: SYMBICORT Inhale 1 puff into the lungs 2 (two) times daily.   colchicine 0.6 MG tablet Take 0.6 mg by mouth daily as needed for pain.   feeding supplement (ENSURE ENLIVE) Liqd Take 237 mLs by mouth 2 (two) times daily between meals.   Fish Oil 1000 MG Caps Take  1,000 mg by mouth daily.   Lantus SoloStar 100 UNIT/ML Solostar Pen Generic drug: Insulin Glargine Inject 10 Units into the skin daily.   loperamide 2 MG capsule Commonly known as: IMODIUM Take 1 capsule (2 mg total) by mouth 4 (four) times daily as needed for diarrhea or loose stools.   magnesium oxide 400 (241.3 Mg) MG tablet Commonly known as: MAG-OX Take 1 tablet (400 mg total) by mouth 2 (two) times daily.   metoprolol succinate 50 MG 24 hr tablet Commonly known as: TOPROL-XL Take 50 mg by mouth daily. Take with or immediately following a meal.   nitroGLYCERIN 0.4 MG SL tablet Commonly known as: NITROSTAT Place 1 tablet (0.4 mg total) under the tongue every 5 (five) minutes as needed. For chest pain What changed:   reasons to take this  additional instructions   ondansetron 8 MG tablet Commonly known as: ZOFRAN Take 1 tablet (8 mg total) by mouth every 8 (eight) hours as needed for nausea or vomiting.   pantoprazole 40 MG tablet Commonly known as: PROTONIX Take 1 tablet (40 mg total) by mouth daily. Start taking on: October 08, 2018   saccharomyces boulardii 250 MG capsule Commonly known as: FLORASTOR Take 1 capsule (250 mg total) by mouth 2 (two) times daily for 15 days.   traMADol 50 MG tablet Commonly known as: ULTRAM Take 50-100 mg by mouth every 6 (six) hours as needed for pain.      Follow-up Information    Coralie Keens, MD. Call.   Specialty: General Surgery Why: Please call to confirm your appointment time with the office  Contact information: [place] [address] [phone]        Richardson, Derek Michael, MD. Schedule an appointment as soon as possible for a visit.   Specialty: Internal Medicine Why: for regular followup Contact information: [place] [address] [phone]           Time coordinating discharge: 39 minutes  Signed:  Gjon Letarte  [facility] 10/07/2018, 11:30 AM

## 2018-10-07 NOTE — Progress Notes (Signed)
Pt discharged home in stable condition. Discharge instructions given. Pt verbalized understanding. Script sent to pharmacy. No immediate questions or concerns at this time.

## 2018-10-16 ENCOUNTER — Other Ambulatory Visit: Payer: Medicare Other

## 2018-10-16 ENCOUNTER — Ambulatory Visit: Payer: Medicare Other

## 2018-10-16 NOTE — Progress Notes (Signed)
Patient Care Team: Tsosie Billing, MD (Inactive) as PCP - General (Internal Medicine)  DIAGNOSIS:    ICD-10-CM   1. Malignant neoplasm of upper-outer quadrant of right breast in female, estrogen receptor positive ([place])  C50.411    Z17.0     SUMMARY OF ONCOLOGIC HISTORY: Oncology History  Malignant neoplasm of upper-outer quadrant of right breast in female, estrogen receptor positive ([place])  09/03/2017 Initial Diagnosis   Screening mammogram detected 1.4 cm right breast mass at 10 o'clock position, additional pleomorphic calcifications 2 cm posteriorly, additional loosely grouped calcifications 10.1 x 7.1 x 5.3 cm, single right axillary lymph node: Biopsy revealed DCIS ER 100%, PR 50%; IDC grade 2-3 ER 100%, PR 50%, Ki-67 15%, HER-2 positive ratio 2, copy #5, lymph node negative T2 N0 stage Ia clinical stage AJCC 8   09/03/2017 Cancer Staging   Staging form: Breast, AJCC 8th Edition - Clinical stage from 09/03/2017: Stage IB (cT2, cN0, cM0, G3, ER+, PR+, HER2+) - Signed by Gardenia Phlegm, NP on 04/10/2018   11/27/2017 Surgery   Right mastectomy: IDC grade 2, 1.6 cm, separate foci of DCIS intermediate grade, margins negative, 0/5 lymph nodes negative,ER 100%, PR 50%, Ki-67 15%, HER-2 positive ratio 2, copy #5 T1c N0 stage Ia   11/27/2017 Cancer Staging   Staging form: Breast, AJCC 8th Edition - Pathologic stage from 11/27/2017: Stage IA (pT1c, pN0, cM0, G2, ER+, PR+, HER2+) - Signed by Gardenia Phlegm, NP on 04/10/2018   02/07/2018 -  Chemotherapy   The patient had trastuzumab (HERCEPTIN) 483 mg in sodium chloride 0.9 % 250 mL chemo infusion, 8 mg/kg = 483 mg, Intravenous,  Once, 10 of 11 cycles Administration: 483 mg (02/07/2018), 357 mg (02/27/2018), 357 mg (05/01/2018), 357 mg (05/23/2018), 357 mg (06/12/2018), 357 mg (07/03/2018), 357 mg (08/14/2018), 357 mg (09/04/2018), 357 mg (03/20/2018), 357 mg (04/10/2018) trastuzumab-anns (KANJINTI) 357 mg in sodium chloride 0.9 % 250  mL chemo infusion, 6 mg/kg = 357 mg (100 % of original dose 6 mg/kg), Intravenous,  Once, 0 of 7 cycles Dose modification: 6 mg/kg (original dose 6 mg/kg, Cycle 12, Reason: Other (see comments))  for chemotherapy treatment.    02/27/2018 -  Anti-estrogen oral therapy   Anastrozole 72m daily     CHIEF COMPLIANT: Follow-up on Herceptin with anastrozole, recent hospitalizations  INTERVAL HISTORY: EKamille Toomeyis a 68y.o. with above-mentioned history of right breast cancer who underwent a right mastectomy. She is currently on adjuvant therapy with Herceptin and anti-estrogen therapy with anastrozole. [facility]. Hemoglobin was stable above 9 at discharge. She was admitted again from 8/27-9/1 after persistent abdominal pain and nausea. CT abdomen, pelvis showed cholelithiasis with gallbladder wall thickening representing acute cholecystitis. She underwent an ERCP and hemoglobin remained stable. She presents to the clinic today for follow-up of her hospitalizations. .Marland Kitchen   REVIEW OF SYSTEMS:   Constitutional: Uses a walker to get around Eyes: Denies blurriness of vision Ears, nose, mouth, throat, and face: Denies mucositis or sore throat Respiratory: Denies cough, dyspnea or wheezes Cardiovascular: Denies palpitation, chest discomfort Gastrointestinal: Denies nausea, heartburn or change in bowel habits Skin: Denies abnormal skin rashes Lymphatics: Denies new lymphadenopathy or easy bruising Neurological: Denies numbness, tingling or new weaknesses Behavioral/Psych: Mood is stable, no new changes  Extremities: No lower extremity edema Breast: Itching of the right chest wall All other systems were reviewed with the patient and are negative.  I have reviewed the  past medical history, past surgical history, social history and family history with the patient and they are unchanged from previous note.  ALLERGIES:  has No Known  Allergies.  MEDICATIONS:  Current Outpatient Medications  Medication Sig Dispense Refill  . acetaminophen (TYLENOL) 325 MG tablet Take 2 tablets (650 mg total) by mouth every 4 (four) hours as needed for headache or mild pain. 60 tablet 1  . albuterol (PROVENTIL) (2.5 MG/3ML) 0.083% nebulizer solution INHALE THE CONTENTS OF 1 VIAL USING NEBULIZER EVERY 6 HOURS AS NEEDEDFOR WHEEZING OR SHORTNESS OF BREATH (Patient taking differently: Take 2.5 mg by nebulization every 6 (six) hours as needed for wheezing or shortness of breath. ) 75 mL 2  . allopurinol (ZYLOPRIM) 100 MG tablet Take 100 mg by mouth daily.    Marland Kitchen ALPRAZolam (XANAX) 0.25 MG tablet Take 0.25 mg by mouth at bedtime.     Marland Kitchen amLODipine (NORVASC) 5 MG tablet Take 5 mg by mouth daily.    Marland Kitchen anastrozole (ARIMIDEX) 1 MG tablet Take 1 tablet (1 mg total) by mouth daily. 90 tablet 3  . aspirin 81 MG EC tablet Take 1 tablet (81 mg total) by mouth daily. 30 tablet 0  . atorvastatin (LIPITOR) 20 MG tablet Take 20 mg by mouth daily at 6 PM.     . budesonide-formoterol (SYMBICORT) 160-4.5 MCG/ACT inhaler Inhale 1 puff into the lungs 2 (two) times daily.     . colchicine 0.6 MG tablet Take 0.6 mg by mouth daily as needed for pain.    . feeding supplement, ENSURE ENLIVE, (ENSURE ENLIVE) LIQD Take 237 mLs by mouth 2 (two) times daily between meals. 60 Bottle 0  . LANTUS SOLOSTAR 100 UNIT/ML Solostar Pen Inject 10 Units into the skin daily.    Marland Kitchen loperamide (IMODIUM) 2 MG capsule Take 1 capsule (2 mg total) by mouth 4 (four) times daily as needed for diarrhea or loose stools. 12 capsule 0  . magnesium oxide (MAG-OX) 400 (241.3 Mg) MG tablet Take 1 tablet (400 mg total) by mouth 2 (two) times daily. 60 tablet 0  . metoprolol succinate (TOPROL-XL) 50 MG 24 hr tablet Take 50 mg by mouth daily. Take with or immediately following a meal.    . nitroGLYCERIN (NITROSTAT) 0.4 MG SL tablet Place 1 tablet (0.4 mg total) under the tongue every 5 (five) minutes as  needed. For chest pain (Patient taking differently: Place 0.4 mg under the tongue every 5 (five) minutes as needed for chest pain. ) 10 tablet 0  . Omega-3 Fatty Acids (FISH OIL) 1000 MG CAPS Take 1,000 mg by mouth daily.    . ondansetron (ZOFRAN) 8 MG tablet Take 1 tablet (8 mg total) by mouth every 8 (eight) hours as needed for nausea or vomiting. 20 tablet 0  . pantoprazole (PROTONIX) 40 MG tablet Take 1 tablet (40 mg total) by mouth daily. 30 tablet 0  . traMADol (ULTRAM) 50 MG tablet Take 50-100 mg by mouth every 6 (six) hours as needed for pain.     No current facility-administered medications for this visit.     PHYSICAL EXAMINATION: ECOG PERFORMANCE STATUS: 2 - Symptomatic, <50% confined to bed  Vitals:   10/17/18 1213  BP: 123/67  Pulse: 71  Resp: 17  Temp: 98.9 F (37.2 C)  SpO2: 100%   Filed Weights   10/17/18 1213  Weight: 126 lb 3.2 oz (57.2 kg)    GENERAL: alert, no distress and comfortable SKIN: skin color, texture, turgor are normal, no rashes  or significant lesions EYES: normal, Conjunctiva are pink and non-injected, sclera clear OROPHARYNX: no exudate, no erythema and lips, buccal mucosa, and tongue normal  NECK: supple, thyroid normal size, non-tender, without nodularity LYMPH: no palpable lymphadenopathy in the cervical, axillary or inguinal LUNGS: clear to auscultation and percussion with normal breathing effort HEART: regular rate & rhythm and no murmurs and no lower extremity edema ABDOMEN: abdomen soft, non-tender and normal bowel sounds MUSCULOSKELETAL: no cyanosis of digits and no clubbing  NEURO: alert & oriented x 3 with fluent speech, no focal motor/sensory deficits EXTREMITIES: No lower extremity edema  LABORATORY DATA:  I have reviewed the data as listed CMP Latest Ref Rng & Units 10/07/2018 10/06/2018 10/05/2018  Glucose 70 - 99 mg/dL 110(H) 95 113(H)  BUN 8 - 23 mg/dL 11 11 18   Creatinine 0.44 - 1.00 mg/dL 1.59(H) 1.49(H) 1.49(H)  Sodium 135 -  145 mmol/L 142 143 142  Potassium 3.5 - 5.1 mmol/L 3.6 3.6 3.3(L)  Chloride 98 - 111 mmol/L 110 112(H) 112(H)  CO2 22 - 32 mmol/L 23 23 23   Calcium 8.9 - 10.3 mg/dL 9.2 9.1 8.5(L)  Total Protein 6.5 - 8.1 g/dL 7.1 6.9 6.4(L)  Total Bilirubin 0.3 - 1.2 mg/dL 1.0 1.5(H) 2.0(H)  Alkaline Phos 38 - 126 U/L 647(H) 773(H) 758(H)  AST 15 - 41 U/L 62(H) 180(H) 277(H)  ALT 0 - 44 U/L 175(H) 247(H) 229(H)    Lab Results  Component Value Date   WBC 5.7 10/17/2018   HGB 9.2 (L) 10/17/2018   HCT 29.3 (L) 10/17/2018   MCV 99.0 10/17/2018   PLT 375 10/17/2018   NEUTROABS 2.5 10/17/2018    ASSESSMENT & PLAN:  Malignant neoplasm of upper-outer quadrant of right breast in female, estrogen receptor positive ([name]) 11/27/2017 right mastectomy: IDC grade 2, 1.6 cm, separate foci of DCIS intermediate grade, margins negative, 0/5 lymph nodes negative,ER 100%, PR 50%, Ki-67 15%, HER-2 positive ratio 2, copy #5 T1c N0 stage Ia  Treatment plan: 1. Adjuvant Herceptin every 3 weeks for 1 year (because of poor performance status Taxol is felt to be intolerable for her), started January 2020  2. Adjuvant antiestrogen therapy with letrozole 2.5 mg daily  Patient's daughter is the primary point of contact for her and all appointment changes will need to go through her. -------------------------------------------------------------------------------------- Hospitalization 10/02/2018-10/07/2018: Acute cholecystitis We discussed the pros and cons of continuing anti-HER-2 therapy. She has tolerated Herceptin extremely well and therefore we decided to continue and finish her treatment which will be done by December.    No orders of the defined types were placed in this encounter.  The patient has a good understanding of the overall plan. she agrees with it. she will call with any problems that may develop before the next visit here.  Nicholas Lose, MD 10/17/2018  Julious Oka Dorshimer am acting as scribe for Dr.  Nicholas Lose.  I have reviewed the above documentation for accuracy and completeness, and I agree with the above.

## 2018-10-16 NOTE — Progress Notes (Signed)
The following biosimilar Kanjinti (trastuzumab-anns) has been selected for use in this patient.  

## 2018-10-17 ENCOUNTER — Inpatient Hospital Stay: Payer: Medicare Other

## 2018-10-17 ENCOUNTER — Inpatient Hospital Stay: Payer: Medicare Other | Attending: Oncology

## 2018-10-17 ENCOUNTER — Inpatient Hospital Stay (HOSPITAL_BASED_OUTPATIENT_CLINIC_OR_DEPARTMENT_OTHER): Payer: Medicare Other | Admitting: Hematology and Oncology

## 2018-10-17 ENCOUNTER — Other Ambulatory Visit: Payer: Self-pay

## 2018-10-17 DIAGNOSIS — Z170 Estrogen receptor positive status [ER+]: Secondary | ICD-10-CM

## 2018-10-17 DIAGNOSIS — Z5112 Encounter for antineoplastic immunotherapy: Secondary | ICD-10-CM | POA: Diagnosis present

## 2018-10-17 DIAGNOSIS — C50411 Malignant neoplasm of upper-outer quadrant of right female breast: Secondary | ICD-10-CM

## 2018-10-17 DIAGNOSIS — Z9011 Acquired absence of right breast and nipple: Secondary | ICD-10-CM | POA: Insufficient documentation

## 2018-10-17 DIAGNOSIS — Z79899 Other long term (current) drug therapy: Secondary | ICD-10-CM | POA: Diagnosis not present

## 2018-10-17 DIAGNOSIS — Z95828 Presence of other vascular implants and grafts: Secondary | ICD-10-CM

## 2018-10-17 LAB — CBC WITH DIFFERENTIAL (CANCER CENTER ONLY)
Abs Immature Granulocytes: 0.03 10*3/uL (ref 0.00–0.07)
Basophils Absolute: 0 10*3/uL (ref 0.0–0.1)
Basophils Relative: 1 %
Eosinophils Absolute: 0.3 10*3/uL (ref 0.0–0.5)
Eosinophils Relative: 5 %
HCT: 29.3 % — ABNORMAL LOW (ref 36.0–46.0)
Hemoglobin: 9.2 g/dL — ABNORMAL LOW (ref 12.0–15.0)
Immature Granulocytes: 1 %
Lymphocytes Relative: 41 %
Lymphs Abs: 2.3 10*3/uL (ref 0.7–4.0)
MCH: 31.1 pg (ref 26.0–34.0)
MCHC: 31.4 g/dL (ref 30.0–36.0)
MCV: 99 fL (ref 80.0–100.0)
Monocytes Absolute: 0.6 10*3/uL (ref 0.1–1.0)
Monocytes Relative: 10 %
Neutro Abs: 2.5 10*3/uL (ref 1.7–7.7)
Neutrophils Relative %: 42 %
Platelet Count: 375 10*3/uL (ref 150–400)
RBC: 2.96 MIL/uL — ABNORMAL LOW (ref 3.87–5.11)
RDW: 14.2 % (ref 11.5–15.5)
WBC Count: 5.7 10*3/uL (ref 4.0–10.5)
nRBC: 0.4 % — ABNORMAL HIGH (ref 0.0–0.2)

## 2018-10-17 LAB — CMP (CANCER CENTER ONLY)
ALT: 30 U/L (ref 0–44)
AST: 31 U/L (ref 15–41)
Albumin: 3.4 g/dL — ABNORMAL LOW (ref 3.5–5.0)
Alkaline Phosphatase: 319 U/L — ABNORMAL HIGH (ref 38–126)
Anion gap: 7 (ref 5–15)
BUN: 13 mg/dL (ref 8–23)
CO2: 25 mmol/L (ref 22–32)
Calcium: 9 mg/dL (ref 8.9–10.3)
Chloride: 106 mmol/L (ref 98–111)
Creatinine: 1.71 mg/dL — ABNORMAL HIGH (ref 0.44–1.00)
GFR, Est AFR Am: 35 mL/min — ABNORMAL LOW (ref >60)
GFR, Estimated: 30 mL/min — ABNORMAL LOW (ref >60)
Glucose, Bld: 136 mg/dL — ABNORMAL HIGH (ref 70–99)
Potassium: 4.2 mmol/L (ref 3.5–5.1)
Sodium: 138 mmol/L (ref 135–145)
Total Bilirubin: 0.6 mg/dL (ref 0.3–1.2)
Total Protein: 8.1 g/dL (ref 6.5–8.1)

## 2018-10-17 MED ORDER — TRASTUZUMAB CHEMO 150 MG IV SOLR
6.0000 mg/kg | Freq: Once | INTRAVENOUS | Status: AC
  Administered 2018-10-17: 357 mg via INTRAVENOUS
  Filled 2018-10-17: qty 17

## 2018-10-17 MED ORDER — SODIUM CHLORIDE 0.9 % IV SOLN
Freq: Once | INTRAVENOUS | Status: AC
  Administered 2018-10-17: 13:00:00 via INTRAVENOUS
  Filled 2018-10-17: qty 250

## 2018-10-17 MED ORDER — SODIUM CHLORIDE 0.9% FLUSH
10.0000 mL | INTRAVENOUS | Status: DC | PRN
  Administered 2018-10-17: 10 mL
  Filled 2018-10-17: qty 10

## 2018-10-17 MED ORDER — ACETAMINOPHEN 325 MG PO TABS
ORAL_TABLET | ORAL | Status: AC
  Filled 2018-10-17: qty 2

## 2018-10-17 MED ORDER — DIPHENHYDRAMINE HCL 25 MG PO CAPS
50.0000 mg | ORAL_CAPSULE | Freq: Once | ORAL | Status: AC
  Administered 2018-10-17: 13:00:00 50 mg via ORAL

## 2018-10-17 MED ORDER — ACETAMINOPHEN 325 MG PO TABS
650.0000 mg | ORAL_TABLET | Freq: Once | ORAL | Status: AC
  Administered 2018-10-17: 650 mg via ORAL

## 2018-10-17 MED ORDER — SODIUM CHLORIDE 0.9% FLUSH
10.0000 mL | INTRAVENOUS | Status: DC | PRN
  Administered 2018-10-17: 14:00:00 10 mL
  Filled 2018-10-17: qty 10

## 2018-10-17 MED ORDER — DIPHENHYDRAMINE HCL 25 MG PO CAPS
ORAL_CAPSULE | ORAL | Status: AC
  Filled 2018-10-17: qty 2

## 2018-10-17 MED ORDER — HEPARIN SOD (PORK) LOCK FLUSH 100 UNIT/ML IV SOLN
500.0000 [IU] | Freq: Once | INTRAVENOUS | Status: AC | PRN
  Administered 2018-10-17: 500 [IU]
  Filled 2018-10-17: qty 5

## 2018-10-17 NOTE — Assessment & Plan Note (Signed)
11/27/2017 right mastectomy: IDC grade 2, 1.6 cm, separate foci of DCIS intermediate grade, margins negative, 0/5 lymph nodes negative,ER 100%, PR 50%, Ki-67 15%, HER-2 positive ratio 2, copy #5 T1c N0 stage Ia  Treatment plan: 1. Adjuvant Herceptin every 3 weeks for 1 year (because of poor performance status Taxol is felt to be intolerable for her), started January 2020  2. Adjuvant antiestrogen therapy with letrozole 2.5 mg daily  Patient's daughter is the primary point of contact for her and all appointment changes will need to go through her. -------------------------------------------------------------------------------------- Hospitalization 10/02/2018-10/07/2018: Acute cholecystitis We discussed the pros and cons of continuing anti-HER-2 therapy. She has tolerated Herceptin extremely well and therefore we decided to continue and finish her treatment which will be done by December.

## 2018-10-17 NOTE — Patient Instructions (Signed)

## 2018-10-17 NOTE — Patient Instructions (Signed)
Trastuzumab injection for infusion What is this medicine? TRASTUZUMAB (tras TOO zoo mab) is a monoclonal antibody. It is used to treat breast cancer and stomach cancer. This medicine may be used for other purposes; ask your health care provider or pharmacist if you have questions. COMMON BRAND NAME(S): Herceptin, Herzuma, KANJINTI, Ogivri, Ontruzant, Trazimera What should I tell my health care provider before I take this medicine? They need to know if you have any of these conditions:  heart disease  heart failure  lung or breathing disease, like asthma  an unusual or allergic reaction to trastuzumab, benzyl alcohol, or other medications, foods, dyes, or preservatives  pregnant or trying to get pregnant  breast-feeding How should I use this medicine? This drug is given as an infusion into a vein. It is administered in a hospital or clinic by a specially trained health care professional. Talk to your pediatrician regarding the use of this medicine in children. This medicine is not approved for use in children. Overdosage: If you think you have taken too much of this medicine contact a poison control center or emergency room at once. NOTE: This medicine is only for you. Do not share this medicine with others. What if I miss a dose? It is important not to miss a dose. Call your doctor or health care professional if you are unable to keep an appointment. What may interact with this medicine? This medicine may interact with the following medications:  certain types of chemotherapy, such as daunorubicin, doxorubicin, epirubicin, and idarubicin This list may not describe all possible interactions. Give your health care provider a list of all the medicines, herbs, non-prescription drugs, or dietary supplements you use. Also tell them if you smoke, drink alcohol, or use illegal drugs. Some items may interact with your medicine. What should I watch for while using this medicine? Visit your  doctor for checks on your progress. Report any side effects. Continue your course of treatment even though you feel ill unless your doctor tells you to stop. Call your doctor or health care professional for advice if you get a fever, chills or sore throat, or other symptoms of a cold or flu. Do not treat yourself. Try to avoid being around people who are sick. You may experience fever, chills and shaking during your first infusion. These effects are usually mild and can be treated with other medicines. Report any side effects during the infusion to your health care professional. Fever and chills usually do not happen with later infusions. Do not become pregnant while taking this medicine or for 7 months after stopping it. Women should inform their doctor if they wish to become pregnant or think they might be pregnant. Women of child-bearing potential will need to have a negative pregnancy test before starting this medicine. There is a potential for serious side effects to an unborn child. Talk to your health care professional or pharmacist for more information. Do not breast-feed an infant while taking this medicine or for 7 months after stopping it. Women must use effective birth control with this medicine. What side effects may I notice from receiving this medicine? Side effects that you should report to your doctor or health care professional as soon as possible:  allergic reactions like skin rash, itching or hives, swelling of the face, lips, or tongue  chest pain or palpitations  cough  dizziness  feeling faint or lightheaded, falls  fever  general ill feeling or flu-like symptoms  signs of worsening heart failure like   breathing problems; swelling in your legs and feet  unusually weak or tired Side effects that usually do not require medical attention (report to your doctor or health care professional if they continue or are bothersome):  bone pain  changes in  taste  diarrhea  joint pain  nausea/vomiting  weight loss This list may not describe all possible side effects. Call your doctor for medical advice about side effects. You may report side effects to FDA at 1-800-FDA-1088. Where should I keep my medicine? This drug is given in a hospital or clinic and will not be stored at home. NOTE: This sheet is a summary. It may not cover all possible information. If you have questions about this medicine, talk to your doctor, pharmacist, or health care provider.  2020 Elsevier/Gold Standard (2016-01-17 14:37:52)  

## 2018-10-21 ENCOUNTER — Other Ambulatory Visit: Payer: Self-pay | Admitting: Surgery

## 2018-11-04 NOTE — Progress Notes (Signed)
[place], [place] [phone] [phone] [place] [address] Phone: [phone] Fax: [phone]      Your procedure is scheduled on Tuesday, October 6th.  Report to [facility] Main Entrance "A" at 7:00 A.M., and check in at the Admitting office.  Call this number if you have problems the morning of surgery:  [phone]  Call [phone] if you have any questions prior to your surgery date Monday-Friday 8am-4pm    Remember:  Do not eat after midnight the night before your surgery  You may drink clear liquids until 6:00 AM the morning of your surgery.   Clear liquids allowed are: Water, Non-Citrus Juices (without pulp), Carbonated Beverages, Clear Tea, Black Coffee Only, and Gatorade    Take these medicines the morning of surgery with A SIP OF WATER   Tylenol - if needed  Albuterol inhaler - if needed (bring with you on day of surgery)  Amlodipine (Norvasc)  Anastrozole (Arimidex)  Symbicort Inhaler - if needed (bring with you)  Metoprolol  Nitroglycerin - if needed  Zofran - if needed  Protonix - if needed  7 days prior to surgery STOP taking any Aspirin (unless otherwise instructed by your surgeon), Aleve, Naproxen, Ibuprofen, Motrin, Advil, Goody's, BC's, all herbal medications, fish oil, and all vitamins.   WHAT DO I DO ABOUT MY DIABETES MEDICATION?   Marland Kitchen Do not take oral diabetes medicines (pills) the morning of surgery.  . THE NIGHT BEFORE SURGERY, do NOT take Lantus.       How to Manage Your Diabetes Before and After Surgery  Why is it important to control my blood sugar before and after surgery? . Improving blood sugar levels before and after surgery helps healing and can limit problems. . A way of improving blood sugar control is eating a healthy diet by: o  Eating less sugar and carbohydrates o  Increasing activity/exercise o  Talking with your doctor about reaching your blood sugar goals . High blood  sugars (greater than 180 mg/dL) can raise your risk of infections and slow your recovery, so you will need to focus on controlling your diabetes during the weeks before surgery. . Make sure that the doctor who takes care of your diabetes knows about your planned surgery including the date and location.  How do I manage my blood sugar before surgery? . Check your blood sugar at least 4 times a day, starting 2 days before surgery, to make sure that the level is not too high or low. o Check your blood sugar the morning of your surgery when you wake up and every 2 hours until you get to the Short Stay unit. . If your blood sugar is less than 70 mg/dL, you will need to treat for low blood sugar: o Do not take insulin. o Treat a low blood sugar (less than 70 mg/dL) with  cup of clear juice (cranberry or apple), 4 glucose tablets, OR glucose gel. o Recheck blood sugar in 15 minutes after treatment (to make sure it is greater than 70 mg/dL). If your blood sugar is not greater than 70 mg/dL on recheck, call [phone] for further instructions. . Report your blood sugar to the short stay nurse when you get to Short Stay.  . If you are admitted to the hospital after surgery: o Your blood sugar will be checked by the staff and you will probably be given insulin after surgery (instead of oral diabetes medicines) to  make sure you have good blood sugar levels. o The goal for blood sugar control after surgery is 80-180 mg/dL.      The Morning of Surgery  Do not wear jewelry, make-up or nail polish.  Do not wear lotions, powders, or perfumes, or deodorant  Do not shave 48 hours prior to surgery.    Do not bring valuables to the hospital.  [facility] is not responsible for any belongings or valuables.  If you are a smoker, DO NOT Smoke 24 hours prior to surgery IF you wear a CPAP at night please bring your mask, tubing, and machine the morning of surgery   Remember that you must have someone to  transport you home after your surgery, and remain with you for 24 hours if you are discharged the same day.   Contacts, glasses, hearing aids, dentures or bridgework may not be worn into surgery.    Leave your suitcase in the car.  After surgery it may be brought to your room.  For patients admitted to the hospital, discharge time will be determined by your treatment team.  Patients discharged the day of surgery will not be allowed to drive home.    Special instructions:   [facility]- Preparing For Surgery  Before surgery, you can play an important role. Because skin is not sterile, your skin needs to be as free of germs as possible. You can reduce the number of germs on your skin by washing with CHG (chlorahexidine gluconate) Soap before surgery.  CHG is an antiseptic cleaner which kills germs and bonds with the skin to continue killing germs even after washing.    Oral Hygiene is also important to reduce your risk of infection.  Remember - BRUSH YOUR TEETH THE MORNING OF SURGERY WITH YOUR REGULAR TOOTHPASTE  Please do not use if you have an allergy to CHG or antibacterial soaps. If your skin becomes reddened/irritated stop using the CHG.  Do not shave (including legs and underarms) for at least 48 hours prior to first CHG shower. It is OK to shave your face.  Please follow these instructions carefully.   1. Shower the NIGHT BEFORE SURGERY and the MORNING OF SURGERY with CHG Soap.   2. If you chose to wash your hair, wash your hair first as usual with your normal shampoo.  3. After you shampoo, rinse your hair and body thoroughly to remove the shampoo.  4. Use CHG as you would any other liquid soap. You can apply CHG directly to the skin and wash gently with a scrungie or a clean washcloth.   5. Apply the CHG Soap to your body ONLY FROM THE NECK DOWN.  Do not use on open wounds or open sores. Avoid contact with your eyes, ears, mouth and genitals (private parts). Wash Face and  genitals (private parts)  with your normal soap.   6. Wash thoroughly, paying special attention to the area where your surgery will be performed.  7. Thoroughly rinse your body with warm water from the neck down.  8. DO NOT shower/wash with your normal soap after using and rinsing off the CHG Soap.  9. Pat yourself dry with a CLEAN TOWEL.  10. Wear CLEAN PAJAMAS to bed the night before surgery, wear comfortable clothes the morning of surgery  11. Place CLEAN SHEETS on your bed the night of your first shower and DO NOT SLEEP WITH PETS.    Day of Surgery:  Do not apply any deodorants/lotions. Please shower  the morning of surgery with the CHG soap  Please wear clean clothes to the hospital/surgery center.   Remember to brush your teeth WITH YOUR REGULAR TOOTHPASTE.   Please read over the following fact sheets that you were given.

## 2018-11-05 ENCOUNTER — Other Ambulatory Visit: Payer: Self-pay

## 2018-11-05 ENCOUNTER — Encounter (HOSPITAL_COMMUNITY): Payer: Self-pay

## 2018-11-05 ENCOUNTER — Encounter (HOSPITAL_COMMUNITY)
Admission: RE | Admit: 2018-11-05 | Discharge: 2018-11-05 | Disposition: A | Discharge status: Home / Self Care | Payer: Medicare Other | Source: Ambulatory Visit | Attending: Surgery | Admitting: Surgery

## 2018-11-05 DIAGNOSIS — Z01818 Encounter for other preprocedural examination: Secondary | ICD-10-CM | POA: Insufficient documentation

## 2018-11-05 DIAGNOSIS — C50411 Malignant neoplasm of upper-outer quadrant of right female breast: Secondary | ICD-10-CM | POA: Insufficient documentation

## 2018-11-05 DIAGNOSIS — Z170 Estrogen receptor positive status [ER+]: Secondary | ICD-10-CM | POA: Diagnosis not present

## 2018-11-05 HISTORY — DX: Chronic obstructive pulmonary disease, unspecified: J44.9

## 2018-11-05 HISTORY — DX: Presence of dental prosthetic device (complete) (partial): Z97.2

## 2018-11-05 HISTORY — DX: Presence of spectacles and contact lenses: Z97.3

## 2018-11-05 HISTORY — DX: Dependence on other enabling machines and devices: Z99.89

## 2018-11-05 HISTORY — DX: Unspecified hearing loss, unspecified ear: H91.90

## 2018-11-05 LAB — CBC
HCT: 30.4 % — ABNORMAL LOW (ref 36.0–46.0)
Hemoglobin: 8.9 g/dL — ABNORMAL LOW (ref 12.0–15.0)
MCH: 29 pg (ref 26.0–34.0)
MCHC: 29.3 g/dL — ABNORMAL LOW (ref 30.0–36.0)
MCV: 99 fL (ref 80.0–100.0)
Platelets: 365 10*3/uL (ref 150–400)
RBC: 3.07 MIL/uL — ABNORMAL LOW (ref 3.87–5.11)
RDW: 14.2 % (ref 11.5–15.5)
WBC: 8.2 10*3/uL (ref 4.0–10.5)
nRBC: 0.2 % (ref 0.0–0.2)

## 2018-11-05 LAB — COMPREHENSIVE METABOLIC PANEL
ALT: 82 U/L — ABNORMAL HIGH (ref 0–44)
AST: 58 U/L — ABNORMAL HIGH (ref 15–41)
Albumin: 3.4 g/dL — ABNORMAL LOW (ref 3.5–5.0)
Alkaline Phosphatase: 232 U/L — ABNORMAL HIGH (ref 38–126)
Anion gap: 11 (ref 5–15)
BUN: 21 mg/dL (ref 8–23)
CO2: 25 mmol/L (ref 22–32)
Calcium: 9.5 mg/dL (ref 8.9–10.3)
Chloride: 101 mmol/L (ref 98–111)
Creatinine, Ser: 1.73 mg/dL — ABNORMAL HIGH (ref 0.44–1.00)
GFR calc Af Amer: 34 mL/min — ABNORMAL LOW (ref >60)
GFR calc non Af Amer: 29 mL/min — ABNORMAL LOW (ref >60)
Glucose, Bld: 241 mg/dL — ABNORMAL HIGH (ref 70–99)
Potassium: 4.7 mmol/L (ref 3.5–5.1)
Sodium: 137 mmol/L (ref 135–145)
Total Bilirubin: 0.5 mg/dL (ref 0.3–1.2)
Total Protein: 8.3 g/dL — ABNORMAL HIGH (ref 6.5–8.1)

## 2018-11-05 LAB — GLUCOSE, CAPILLARY: Glucose-Capillary: 284 mg/dL — ABNORMAL HIGH (ref 70–99)

## 2018-11-05 LAB — HEMOGLOBIN A1C
Hgb A1c MFr Bld: 6.4 % — ABNORMAL HIGH (ref 4.8–5.6)
Mean Plasma Glucose: 136.98 mg/dL

## 2018-11-05 NOTE — Progress Notes (Signed)
[place], [place] [phone] [address] [phone] [place] [address] Phone: [phone] Fax: [place], [address] [place] [address] Phone: [phone] Fax: [phone]      Your procedure is scheduled on Tuesday, October 6th.  Report to [facility] Main Entrance "A" at 7:00 A.M., and check in at the Admitting office.  Call this number if you have problems the morning of surgery:  [phone]  Call [phone] if you have any questions prior to your surgery date Monday-Friday 8am-4pm    Remember:  Do not eat after midnight the night before your surgery  You may drink clear liquids until 6:00 AM the morning of your surgery.   Clear liquids allowed are: Water, Non-Citrus Juices (without pulp), Carbonated Beverages, Clear Tea, Black Coffee Only, and Gatorade    Take these medicines the morning of surgery with A SIP OF WATER   Tylenol - if needed  Albuterol inhaler - if needed (bring with you on day of surgery)  Amlodipine (Norvasc)  Anastrozole (Arimidex)  Symbicort Inhaler - if needed (bring with you)  Metoprolol  Nitroglycerin - if needed  Zofran - if needed  Protonix - if needed  7 days prior to surgery STOP taking any Aspirin (unless otherwise instructed by your surgeon), Aleve, Naproxen, Ibuprofen, Motrin, Advil, Goody's, BC's, all herbal medications, fish oil, and all vitamins.   WHAT DO I DO ABOUT MY DIABETES MEDICATION?   Marland Kitchen Do not take oral diabetes medicines (pills) the morning of surgery.  . THE NIGHT BEFORE SURGERY, take 50% of your Lantus dose, 5 units.     How to Manage Your Diabetes Before and After Surgery  Why is it important to control my blood sugar before and after surgery? . Improving blood sugar levels before and after surgery helps healing and can limit problems. . A way of improving blood sugar control is  eating a healthy diet by: o  Eating less sugar and carbohydrates o  Increasing activity/exercise o  Talking with your doctor about reaching your blood sugar goals . High blood sugars (greater than 180 mg/dL) can raise your risk of infections and slow your recovery, so you will need to focus on controlling your diabetes during the weeks before surgery. . Make sure that the doctor who takes care of your diabetes knows about your planned surgery including the date and location.  How do I manage my blood sugar before surgery? . Check your blood sugar at least 4 times a day, starting 2 days before surgery, to make sure that the level is not too high or low. o Check your blood sugar the morning of your surgery when you wake up and every 2 hours until you get to the Short Stay unit. . If your blood sugar is less than 70 mg/dL, you will need to treat for low blood sugar: o Do not take insulin. o Treat a low blood sugar (less than 70 mg/dL) with  cup of clear juice (cranberry or apple), 4 glucose tablets, OR glucose gel. o Recheck blood sugar in 15 minutes after treatment (to make sure it is greater than 70 mg/dL). If your blood sugar is not greater than 70 mg/dL on recheck, call [phone] for further instructions. . Report your blood sugar to the short stay nurse when you get to Short Stay.  . If you are admitted to the hospital after surgery:  o Your blood sugar will be checked by the staff and you will probably be given insulin after surgery (instead of oral diabetes medicines) to make sure you have good blood sugar levels. o The goal for blood sugar control after surgery is 80-180 mg/dL.    The Morning of Surgery  Do not wear jewelry, make-up or nail polish.  Do not wear lotions, powders, or perfumes, or deodorant  Do not shave 48 hours prior to surgery.    Do not bring valuables to the hospital.  [facility] is not responsible for any belongings or valuables.  If you are a smoker, DO NOT  Smoke 24 hours prior to surgery IF you wear a CPAP at night please bring your mask, tubing, and machine the morning of surgery   Remember that you must have someone to transport you home after your surgery, and remain with you for 24 hours if you are discharged the same day.   Contacts, glasses, hearing aids, dentures or bridgework may not be worn into surgery.    Leave your suitcase in the car.  After surgery it may be brought to your room.  For patients admitted to the hospital, discharge time will be determined by your treatment team.  Patients discharged the day of surgery will not be allowed to drive home.    Special instructions:   [facility]- Preparing For Surgery  Before surgery, you can play an important role. Because skin is not sterile, your skin needs to be as free of germs as possible. You can reduce the number of germs on your skin by washing with CHG (chlorahexidine gluconate) Soap before surgery.  CHG is an antiseptic cleaner which kills germs and bonds with the skin to continue killing germs even after washing.    Oral Hygiene is also important to reduce your risk of infection.  Remember - BRUSH YOUR TEETH THE MORNING OF SURGERY WITH YOUR REGULAR TOOTHPASTE  Please do not use if you have an allergy to CHG or antibacterial soaps. If your skin becomes reddened/irritated stop using the CHG.  Do not shave (including legs and underarms) for at least 48 hours prior to first CHG shower. It is OK to shave your face.  Please follow these instructions carefully.   1. Shower the NIGHT BEFORE SURGERY and the MORNING OF SURGERY with CHG Soap.   2. If you chose to wash your hair, wash your hair first as usual with your normal shampoo.  3. After you shampoo, rinse your hair and body thoroughly to remove the shampoo.  4. Use CHG as you would any other liquid soap. You can apply CHG directly to the skin and wash gently with a scrungie or a clean washcloth.   5. Apply the CHG Soap  to your body ONLY FROM THE NECK DOWN.  Do not use on open wounds or open sores. Avoid contact with your eyes, ears, mouth and genitals (private parts). Wash Face and genitals (private parts)  with your normal soap.   6. Wash thoroughly, paying special attention to the area where your surgery will be performed.  7. Thoroughly rinse your body with warm water from the neck down.  8. DO NOT shower/wash with your normal soap after using and rinsing off the CHG Soap.  9. Pat yourself dry with a CLEAN TOWEL.  10. Wear CLEAN PAJAMAS to bed the night before surgery, wear comfortable clothes the morning of surgery  11. Place CLEAN SHEETS on your bed the night of  your first shower and DO NOT SLEEP WITH PETS.    Day of Surgery:  Do not apply any deodorants/lotions. Please shower the morning of surgery with the CHG soap  Please wear clean clothes to the hospital/surgery center.   Remember to brush your teeth WITH YOUR REGULAR TOOTHPASTE.   Please read over the following fact sheets that you were given.

## 2018-11-05 NOTE — Progress Notes (Addendum)
Patient denies shortness of breath, fever, cough and chest pain at PAT appointment  PCP - Dr Dustin Folks  Cardiologist - Dr Quillian Quince Bensimhon/ Dr Terrence Dupont Oncology - Dr Nicholas Lose  Chest x-ray - 09/18/18, 1 view; 07/12/18, 1 view EKG - 10/02/18 Stress Test - 07/14/18 ECHO - 09/30/18 Cardiac Cath - 02/20/2011  DM, type 2 - checks sugars 2 x day Fasting sugar range 200s   Aspirin Instructions: Follow your surgeon's instructions on when to stop aspirin prior to surgery,  If no instructions were given by your surgeon then you will need to call the office for those instructions. Patient agrees to call MD for instructions.  ERAS: Clears til 6 am DOS.  No drink.  Anesthesia review: Yes  Coronavirus Screening Have you or Bernarda Caffey experienced the following symptoms:  Cough yes/no: No Fever (>100.47F)  yes/no: No Runny nose yes/no: No Sore throat yes/no: No Difficulty breathing/shortness of breath  yes/no: No  Have you or [place] traveled in the last 14 days and where? yes/no: No  Patient verbalized understanding of instructions that were given to them at the PAT appointment.

## 2018-11-06 ENCOUNTER — Inpatient Hospital Stay: Payer: Medicare Other | Attending: Oncology

## 2018-11-06 ENCOUNTER — Other Ambulatory Visit: Payer: Self-pay

## 2018-11-06 ENCOUNTER — Inpatient Hospital Stay: Payer: Medicare Other

## 2018-11-06 ENCOUNTER — Inpatient Hospital Stay (HOSPITAL_BASED_OUTPATIENT_CLINIC_OR_DEPARTMENT_OTHER): Payer: Medicare Other | Admitting: Hematology and Oncology

## 2018-11-06 VITALS — BP 125/73 | HR 83 | Temp 98.7°F | Resp 18 | Ht 59.0 in | Wt 128.0 lb

## 2018-11-06 DIAGNOSIS — Z170 Estrogen receptor positive status [ER+]: Secondary | ICD-10-CM | POA: Insufficient documentation

## 2018-11-06 DIAGNOSIS — C50411 Malignant neoplasm of upper-outer quadrant of right female breast: Secondary | ICD-10-CM

## 2018-11-06 DIAGNOSIS — D649 Anemia, unspecified: Secondary | ICD-10-CM | POA: Diagnosis not present

## 2018-11-06 DIAGNOSIS — Z5112 Encounter for antineoplastic immunotherapy: Secondary | ICD-10-CM | POA: Diagnosis present

## 2018-11-06 DIAGNOSIS — Z95828 Presence of other vascular implants and grafts: Secondary | ICD-10-CM

## 2018-11-06 DIAGNOSIS — Z79899 Other long term (current) drug therapy: Secondary | ICD-10-CM | POA: Insufficient documentation

## 2018-11-06 LAB — CBC WITH DIFFERENTIAL (CANCER CENTER ONLY)
Abs Immature Granulocytes: 0.1 10*3/uL — ABNORMAL HIGH (ref 0.00–0.07)
Basophils Absolute: 0 10*3/uL (ref 0.0–0.1)
Basophils Relative: 0 %
Eosinophils Absolute: 0.2 10*3/uL (ref 0.0–0.5)
Eosinophils Relative: 2 %
HCT: 24.5 % — ABNORMAL LOW (ref 36.0–46.0)
Hemoglobin: 7.5 g/dL — ABNORMAL LOW (ref 12.0–15.0)
Immature Granulocytes: 1 %
Lymphocytes Relative: 29 %
Lymphs Abs: 2.8 10*3/uL (ref 0.7–4.0)
MCH: 29.3 pg (ref 26.0–34.0)
MCHC: 30.6 g/dL (ref 30.0–36.0)
MCV: 95.7 fL (ref 80.0–100.0)
Monocytes Absolute: 0.8 10*3/uL (ref 0.1–1.0)
Monocytes Relative: 8 %
Neutro Abs: 6 10*3/uL (ref 1.7–7.7)
Neutrophils Relative %: 60 %
Platelet Count: 335 10*3/uL (ref 150–400)
RBC: 2.56 MIL/uL — ABNORMAL LOW (ref 3.87–5.11)
RDW: 14.5 % (ref 11.5–15.5)
WBC Count: 9.9 10*3/uL (ref 4.0–10.5)
nRBC: 0.3 % — ABNORMAL HIGH (ref 0.0–0.2)

## 2018-11-06 LAB — CMP (CANCER CENTER ONLY)
ALT: 146 U/L — ABNORMAL HIGH (ref 0–44)
AST: 103 U/L — ABNORMAL HIGH (ref 15–41)
Albumin: 3.6 g/dL (ref 3.5–5.0)
Alkaline Phosphatase: 376 U/L — ABNORMAL HIGH (ref 38–126)
Anion gap: 8 (ref 5–15)
BUN: 29 mg/dL — ABNORMAL HIGH (ref 8–23)
CO2: 24 mmol/L (ref 22–32)
Calcium: 9.5 mg/dL (ref 8.9–10.3)
Chloride: 105 mmol/L (ref 98–111)
Creatinine: 1.88 mg/dL — ABNORMAL HIGH (ref 0.44–1.00)
GFR, Est AFR Am: 31 mL/min — ABNORMAL LOW (ref >60)
GFR, Estimated: 27 mL/min — ABNORMAL LOW (ref >60)
Glucose, Bld: 207 mg/dL — ABNORMAL HIGH (ref 70–99)
Potassium: 4.3 mmol/L (ref 3.5–5.1)
Sodium: 137 mmol/L (ref 135–145)
Total Bilirubin: 0.5 mg/dL (ref 0.3–1.2)
Total Protein: 8.3 g/dL — ABNORMAL HIGH (ref 6.5–8.1)

## 2018-11-06 MED ORDER — SODIUM CHLORIDE 0.9 % IV SOLN
Freq: Once | INTRAVENOUS | Status: AC
  Administered 2018-11-06: 15:00:00 via INTRAVENOUS
  Filled 2018-11-06: qty 250

## 2018-11-06 MED ORDER — ACETAMINOPHEN 325 MG PO TABS
650.0000 mg | ORAL_TABLET | Freq: Once | ORAL | Status: AC
  Administered 2018-11-06: 650 mg via ORAL

## 2018-11-06 MED ORDER — HEPARIN SOD (PORK) LOCK FLUSH 100 UNIT/ML IV SOLN
500.0000 [IU] | Freq: Once | INTRAVENOUS | Status: AC | PRN
  Administered 2018-11-06: 17:00:00 500 [IU]
  Filled 2018-11-06: qty 5

## 2018-11-06 MED ORDER — SODIUM CHLORIDE 0.9% FLUSH
10.0000 mL | INTRAVENOUS | Status: DC | PRN
  Administered 2018-11-06: 10 mL
  Filled 2018-11-06: qty 10

## 2018-11-06 MED ORDER — DIPHENHYDRAMINE HCL 25 MG PO CAPS
50.0000 mg | ORAL_CAPSULE | Freq: Once | ORAL | Status: AC
  Administered 2018-11-06: 15:00:00 50 mg via ORAL

## 2018-11-06 MED ORDER — TRASTUZUMAB-ANNS CHEMO 150 MG IV SOLR
6.0000 mg/kg | Freq: Once | INTRAVENOUS | Status: AC
  Administered 2018-11-06: 16:00:00 357 mg via INTRAVENOUS
  Filled 2018-11-06: qty 17

## 2018-11-06 MED ORDER — DIPHENHYDRAMINE HCL 25 MG PO CAPS
ORAL_CAPSULE | ORAL | Status: AC
  Filled 2018-11-06: qty 2

## 2018-11-06 MED ORDER — ACETAMINOPHEN 325 MG PO TABS
ORAL_TABLET | ORAL | Status: AC
  Filled 2018-11-06: qty 2

## 2018-11-06 NOTE — Patient Instructions (Signed)
[facility] °Discharge Instructions for Patients Receiving Chemotherapy ° °Today you received the following chemotherapy agents Trastuzumab ° °To help prevent nausea and vomiting after your treatment, we encourage you to take your nausea medication as directed. °  °If you develop nausea and vomiting that is not controlled by your nausea medication, call the clinic.  ° °BELOW ARE SYMPTOMS THAT SHOULD BE REPORTED IMMEDIATELY: °· *FEVER GREATER THAN 100.5 F °· *CHILLS WITH OR WITHOUT FEVER °· NAUSEA AND VOMITING THAT IS NOT CONTROLLED WITH YOUR NAUSEA MEDICATION °· *UNUSUAL SHORTNESS OF BREATH °· *UNUSUAL BRUISING OR BLEEDING °· TENDERNESS IN MOUTH AND THROAT WITH OR WITHOUT PRESENCE OF ULCERS °· *URINARY PROBLEMS °· *BOWEL PROBLEMS °· UNUSUAL RASH °Items with * indicate a potential emergency and should be followed up as soon as possible. ° °Feel free to call the clinic should you have any questions or concerns. The clinic phone number is [phone]. ° °Please show the CHEMO ALERT CARD at check-in to the Emergency Department and triage nurse. ° ° °

## 2018-11-06 NOTE — Progress Notes (Signed)
Patient Care Team: Dominique Billing, MD (Inactive) as PCP - General (Internal Medicine)  DIAGNOSIS:    ICD-10-CM   1. Malignant neoplasm of upper-outer quadrant of right breast in female, estrogen receptor positive ([place])  C50.411    Z17.0     SUMMARY OF ONCOLOGIC HISTORY: Oncology History  Malignant neoplasm of upper-outer quadrant of right breast in female, estrogen receptor positive ([name])  09/03/2017 Initial Diagnosis   Screening mammogram detected 1.4 cm right breast mass at 10 o'clock position, additional pleomorphic calcifications 2 cm posteriorly, additional loosely grouped calcifications 10.1 x 7.1 x 5.3 cm, single right axillary lymph node: Biopsy revealed DCIS ER 100%, PR 50%; IDC grade 2-3 ER 100%, PR 50%, Ki-67 15%, HER-2 positive ratio 2, copy #5, lymph node negative T2 N0 stage Ia clinical stage AJCC 8   09/03/2017 Cancer Staging   Staging form: Breast, AJCC 8th Edition - Clinical stage from 09/03/2017: Stage IB (cT2, cN0, cM0, G3, ER+, PR+, HER2+) - Signed by Dominique Phlegm, NP on 04/10/2018   11/27/2017 Surgery   Right mastectomy: IDC grade 2, 1.6 cm, separate foci of DCIS intermediate grade, margins negative, 0/5 lymph nodes negative,ER 100%, PR 50%, Ki-67 15%, HER-2 positive ratio 2, copy #5 T1c N0 stage Ia   11/27/2017 Cancer Staging   Staging form: Breast, AJCC 8th Edition - Pathologic stage from 11/27/2017: Stage IA (pT1c, pN0, cM0, G2, ER+, PR+, HER2+) - Signed by Dominique Phlegm, NP on 04/10/2018   02/07/2018 -  Chemotherapy   The patient had trastuzumab (HERCEPTIN) 483 mg in sodium chloride 0.9 % 250 mL chemo infusion, 8 mg/kg = 483 mg, Intravenous,  Once, 11 of 11 cycles Administration: 483 mg (02/07/2018), 357 mg (02/27/2018), 357 mg (05/01/2018), 357 mg (05/23/2018), 357 mg (06/12/2018), 357 mg (07/03/2018), 357 mg (08/14/2018), 357 mg (09/04/2018), 357 mg (10/17/2018), 357 mg (03/20/2018), 357 mg (04/10/2018) trastuzumab-anns (KANJINTI) 357 mg in sodium  chloride 0.9 % 250 mL chemo infusion, 6 mg/kg = 357 mg (100 % of original dose 6 mg/kg), Intravenous,  Once, 0 of 7 cycles Dose modification: 6 mg/kg (original dose 6 mg/kg, Cycle 12, Reason: Other (see comments))  for chemotherapy treatment.    02/27/2018 -  Anti-estrogen oral therapy   Anastrozole 42m daily     CHIEF COMPLIANT: Follow-up on Herceptin with anastrozole  INTERVAL HISTORY: Dominique CAMPUSis a 68y.o. with above-mentioned history of right breast cancer who underwent a right mastectomy. She is currently on adjuvant therapy with Herceptin and anti-estrogen therapy with anastrozole. She presents to the clinic today for treatment. She tells me that she continues to have abdominal symptoms and is likely to need a gallbladder surgery.  She is more anemic today than previously.  She does not complain of bleeding symptoms.  It is hard to get a good history from her.  REVIEW OF SYSTEMS:   Constitutional: Denies fevers, chills or abnormal weight loss Eyes: Denies blurriness of vision Ears, nose, mouth, throat, and face: Denies mucositis or sore throat Respiratory: Denies cough, dyspnea or wheezes Cardiovascular: Denies palpitation, chest discomfort Gastrointestinal: Abdominal discomfort and diarrhea Skin: Denies abnormal skin rashes Lymphatics: Denies new lymphadenopathy or easy bruising Neurological: Denies numbness, tingling or new weaknesses Behavioral/Psych: Mood is stable, no new changes  Extremities: No lower extremity edema Breast: denies any pain or lumps or nodules in either breasts All other systems were reviewed with the patient and are negative.  I have reviewed the past medical history, past surgical history, social history  and family history with the patient and they are unchanged from previous note.  ALLERGIES:  has No Known Allergies.  MEDICATIONS:  Current Outpatient Medications  Medication Sig Dispense Refill  . acetaminophen (TYLENOL) 325 MG tablet Take 2  tablets (650 mg total) by mouth every 4 (four) hours as needed for headache or mild pain. 60 tablet 1  . albuterol (PROVENTIL) (2.5 MG/3ML) 0.083% nebulizer solution INHALE THE CONTENTS OF 1 VIAL USING NEBULIZER EVERY 6 HOURS AS NEEDEDFOR WHEEZING OR SHORTNESS OF BREATH (Patient taking differently: Take 2.5 mg by nebulization every 6 (six) hours as needed for wheezing or shortness of breath. ) 75 mL 2  . allopurinol (ZYLOPRIM) 100 MG tablet Take 100 mg by mouth every evening.     Marland Kitchen ALPRAZolam (XANAX) 0.25 MG tablet Take 0.25 mg by mouth at bedtime.     Marland Kitchen amLODipine (NORVASC) 5 MG tablet Take 5 mg by mouth daily.    Marland Kitchen anastrozole (ARIMIDEX) 1 MG tablet Take 1 tablet (1 mg total) by mouth daily. 90 tablet 3  . aspirin 81 MG EC tablet Take 1 tablet (81 mg total) by mouth daily. 30 tablet 0  . atorvastatin (LIPITOR) 20 MG tablet Take 20 mg by mouth daily at 6 PM.     . budesonide-formoterol (SYMBICORT) 160-4.5 MCG/ACT inhaler Inhale 1 puff into the lungs 2 (two) times daily as needed (respiratory issues.).     Marland Kitchen feeding supplement, ENSURE ENLIVE, (ENSURE ENLIVE) LIQD Take 237 mLs by mouth 2 (two) times daily between meals. 60 Bottle 0  . LANTUS SOLOSTAR 100 UNIT/ML Solostar Pen Inject 10 Units into the skin at bedtime.     Marland Kitchen loperamide (IMODIUM) 2 MG capsule Take 1 capsule (2 mg total) by mouth 4 (four) times daily as needed for diarrhea or loose stools. 12 capsule 0  . magnesium oxide (MAG-OX) 400 (241.3 Mg) MG tablet Take 1 tablet (400 mg total) by mouth 2 (two) times daily. (Patient not taking: Reported on 10/31/2018) 60 tablet 0  . metoprolol succinate (TOPROL-XL) 50 MG 24 hr tablet Take 50 mg by mouth daily. Take with or immediately following a meal.    . Multiple Vitamin (MULTIVITAMIN WITH MINERALS) TABS tablet Take 1 tablet by mouth daily. [place]    . nitroGLYCERIN (NITROSTAT) 0.4 MG SL tablet Place 1 tablet (0.4 mg total) under the tongue every 5 (five) minutes as needed. For chest pain (Patient  taking differently: Place 0.4 mg under the tongue every 5 (five) minutes as needed for chest pain. ) 10 tablet 0  . Omega-3 Fatty Acids (FISH OIL) 1000 MG CAPS Take 1,000 mg by mouth daily.    . ondansetron (ZOFRAN) 8 MG tablet Take 1 tablet (8 mg total) by mouth every 8 (eight) hours as needed for nausea or vomiting. 20 tablet 0  . pantoprazole (PROTONIX) 40 MG tablet Take 1 tablet (40 mg total) by mouth daily. 30 tablet 0   No current facility-administered medications for this visit.     PHYSICAL EXAMINATION: ECOG PERFORMANCE STATUS: 1 - Symptomatic but completely ambulatory  Vitals:   11/06/18 1444  BP: 125/73  Pulse: 83  Resp: 18  Temp: 98.7 F (37.1 C)  SpO2: 100%   Filed Weights   11/06/18 1444  Weight: 128 lb (58.1 kg)    GENERAL: alert, no distress and comfortable SKIN: skin color, texture, turgor are normal, no rashes or significant lesions EYES: normal, Conjunctiva are pink and non-injected, sclera clear OROPHARYNX: no exudate, no erythema and lips,  buccal mucosa, and tongue normal  NECK: supple, thyroid normal size, non-tender, without nodularity LYMPH: no palpable lymphadenopathy in the cervical, axillary or inguinal LUNGS: clear to auscultation and percussion with normal breathing effort HEART: regular rate & rhythm and no murmurs and no lower extremity edema ABDOMEN: abdomen soft, non-tender and normal bowel sounds MUSCULOSKELETAL: no cyanosis of digits and no clubbing  NEURO: alert & oriented x 3 with fluent speech, no focal motor/sensory deficits EXTREMITIES: No lower extremity edema  LABORATORY DATA:  I have reviewed the data as listed CMP Latest Ref Rng & Units 11/05/2018 10/17/2018 10/07/2018  Glucose 70 - 99 mg/dL 241(H) 136(H) 110(H)  BUN 8 - 23 mg/dL _0 Creatinine 0.44 - 1.00 mg/dL 1.73(H) 1.71(H) 1.59(H)  Sodium 135 - 145 mmol/L 137 138 142  Potassium 3.5 - 5.1 mmol/L 4.7 4.2 3.6  Chloride 98 - 111 mmol/L 101 106 110  CO2 22 - 32 mmol/L _1 Calcium 8.9 - 10.3 mg/dL 9.5 9.0 9.2  Total Protein 6.5 - 8.1 g/dL 8.3(H) 8.1 7.1  Total Bilirubin 0.3 - 1.2 mg/dL 0.5 0.6 1.0  Alkaline Phos 38 - 126 U/L 232(H) 319(H) 647(H)  AST 15 - 41 U/L 58(H) 31 62(H)  ALT 0 - 44 U/L 82(H) 30 175(H)    Lab Results  Component Value Date   WBC 9.9 11/06/2018   HGB 7.5 (L) 11/06/2018   HCT 24.5 (L) 11/06/2018   MCV 95.7 11/06/2018   PLT 335 11/06/2018   NEUTROABS 6.0 11/06/2018    ASSESSMENT & PLAN:  Malignant neoplasm of upper-outer quadrant of right breast in female, estrogen receptor positive ([place]) 11/27/2017 right mastectomy: IDC grade 2, 1.6 cm, separate foci of DCIS intermediate grade, margins negative, 0/5 lymph nodes negative,ER 100%, PR 50%, Ki-67 15%, HER-2 positive ratio 2, copy #5 T1c N0 stage Ia  Treatment plan: 1. Adjuvant Herceptin every 3 weeks for 1 year (because of poor performance status Taxol is felt to be intolerable for her), started January 2020  2. Adjuvant antiestrogen therapy with letrozole 2.5 mg daily  Patient's daughter is the primary point of contact for her and all appointment changes will need to go through her. -------------------------------------------------------------------------------------- Hospitalization 10/02/2018-10/07/2018: Acute cholecystitis We discussed the pros and cons of continuing anti-HER-2 therapy. She has tolerated Herceptin extremely well and therefore we decided to continue and finish her treatment which will be done by December.  Herceptin toxicities: None Patient tells me that she will be getting her gallbladder surgery soon. Severe anemia: Hemoglobin 7.5: Patient does not report any bleeding.  Unclear etiology.  I will order iron studies A54 and folic acid with her next blood draw.  If it drops any further below 7 she will need blood transfusion.  Return to clinic every 3 weeks for Herceptin every 6 weeks for follow-up with me.    No orders of the defined types were placed in  this encounter.  The patient has a good understanding of the overall plan. she agrees with it. she will call with any problems that may develop before the next visit here.  Dominique Lose, MD 11/06/2018  [name] am acting as scribe for Dr. Nicholas [name].  I have reviewed the above documentation for accuracy and completeness, and I agree with the above.

## 2018-11-06 NOTE — Assessment & Plan Note (Signed)
11/27/2017 right mastectomy: IDC grade 2, 1.6 cm, separate foci of DCIS intermediate grade, margins negative, 0/5 lymph nodes negative,ER 100%, PR 50%, Ki-67 15%, HER-2 positive ratio 2, copy #5 T1c N0 stage Ia  Treatment plan: 1. Adjuvant Herceptin every 3 weeks for 1 year (because of poor performance status Taxol is felt to be intolerable for her), started January 2020  2. Adjuvant antiestrogen therapy with letrozole 2.5 mg daily  Patient's daughter is the primary point of contact for her and all appointment changes will need to go through her. -------------------------------------------------------------------------------------- Hospitalization 10/02/2018-10/07/2018: Acute cholecystitis We discussed the pros and cons of continuing anti-HER-2 therapy. She has tolerated Herceptin extremely well and therefore we decided to continue and finish her treatment which will be done by December.  Herceptin toxicities: None Return to clinic every 3 weeks for Herceptin every 6 weeks for follow-up with me.

## 2018-11-06 NOTE — Anesthesia Preprocedure Evaluation (Addendum)
Anesthesia Evaluation  Patient identified by MRN, date of birth, ID band Patient awake    Reviewed: Allergy & Precautions, NPO status , Patient's Chart, lab work & pertinent test results  Airway Mallampati: II  TM Distance: >3 FB Neck ROM: Full    Dental  (+) Dental Advisory Given, Edentulous Upper   Pulmonary asthma , COPD,  COPD inhaler, former smoker,     + decreased breath sounds      Cardiovascular hypertension, Pt. on medications and Pt. on home beta blockers + CAD, + Past MI and + Cardiac Stents   Rhythm:Regular Rate:Normal     Neuro/Psych  Headaches, Depression    GI/Hepatic Neg liver ROS, GERD  Medicated,  Endo/Other  diabetes  Renal/GU      Musculoskeletal  (+) Arthritis ,   Abdominal Normal abdominal exam  (+)   Peds  Hematology negative hematology ROS (+)   Anesthesia Other Findings   Reproductive/Obstetrics                           Anesthesia Physical Anesthesia Plan  ASA: III  Anesthesia Plan: General   Post-op Pain Management:    Induction: Intravenous  PONV Risk Score and Plan: 4 or greater and Ondansetron, Dexamethasone and Treatment may vary due to age or medical condition  Airway Management Planned: Oral ETT  Additional Equipment: None  Intra-op Plan:   Post-operative Plan: Extubation in OR  Informed Consent: I have reviewed the patients History and Physical, chart, labs and discussed the procedure including the risks, benefits and alternatives for the proposed anesthesia with the patient or authorized representative who has indicated his/her understanding and acceptance.       Plan Discussed with: CRNA  Anesthesia Plan Comments: (Recently admitted 8/27-10/07/18 for Acute cholecystitis with acute choledocholithiasis with elevated LFTs. Patient underwent ERCP with findings of sludge and clots and sphincterectomy was done.  Follows with cardiologist Dr.  Terrence Dupont for CAD s/p PTCA stenting to RCA and left circumflex in 2008 and 2009. She was admitted June 2020 for evaluation of chest pain. Seen by Dr. Terrence Dupont during admission. Per his notes, MI was ruled out by serial enzymes and EKG.  She did not have any significant anginal chest pain during the hospital stay.  The patient subsequently underwent Lexiscan Myoview as above, which showed no evidence of reversible ischemia. She was discharged with continued medical therapy.  She also follows with Dr. Haroldine Laws in the cardio-oncology clinic for monitoring of cardio-toxicity while undergoing chemotherapy for her R breast CA. Last seen 09/30/18 and per note she has chronic atypical chest pain with recent nonischemic testing noted. Echo ordered at that visit for chemo monitoring showed EF 55-60%.  Review of labs shows stable CKDIII  TTE 09/30/18:  1. The left ventricle has normal systolic function, with an ejection fraction of 55-60%. The cavity size was normal. Left ventricular diastolic Doppler parameters are consistent with pseudonormalization.  2. The right ventricle has normal systolic function. The cavity was normal. There is no increase in right ventricular wall thickness.  3. Left atrial size was mildly dilated.  4. Mild calcification of the mitral valve leaflet.  5. The aortic valve is tricuspid. Mild calcification of the aortic valve. Aortic valve regurgitation is moderate by color flow Doppler.  6. The aorta is normal unless otherwise noted.  Nuclear stress 07/13/18: IMPRESSION: 1. Decreased activity in the inferolateral wall on both rest and stress images, favor soft tissue attenuation although infarct  cannot be completely excluded. No evidence of reversibility/ischemia. 2. Normal left ventricular wall motion. 3. Left ventricular ejection fraction 46%   4. Non invasive risk stratification*: Intermediate based on mildly diminished left ventricular ejection fraction.  )     Anesthesia  Quick Evaluation

## 2018-11-07 ENCOUNTER — Telehealth: Payer: Self-pay | Admitting: Hematology and Oncology

## 2018-11-07 ENCOUNTER — Other Ambulatory Visit (HOSPITAL_COMMUNITY)
Admission: RE | Admit: 2018-11-07 | Discharge: 2018-11-07 | Disposition: A | Discharge status: Home / Self Care | Payer: Medicare Other | Source: Ambulatory Visit | Attending: Orthopaedic Surgery | Admitting: Orthopaedic Surgery

## 2018-11-07 DIAGNOSIS — Z20828 Contact with and (suspected) exposure to other viral communicable diseases: Secondary | ICD-10-CM | POA: Diagnosis not present

## 2018-11-07 DIAGNOSIS — Z01812 Encounter for preprocedural laboratory examination: Secondary | ICD-10-CM | POA: Diagnosis present

## 2018-11-07 NOTE — Telephone Encounter (Signed)
I talk with patient regarding schedule  

## 2018-11-09 LAB — NOVEL CORONAVIRUS, NAA (HOSP ORDER, SEND-OUT TO REF LAB; TAT 18-24 HRS): SARS-CoV-2, NAA: NOT DETECTED

## 2018-11-10 NOTE — H&P (Signed)
[name] Documented: 10/21/2018 9:08 AM Location: [name] Patient #: [id number] DOB: Apr 09, 1950 Single / Language: [name] / Race: Black or African American Female   History of Present Illness ([name] A. Ninfa Linden MD; 10/21/2018 9:21 AM) The patient is a 68 year old female who presents for evaluation of gall stones. She is here for a follow-up of her most recent hospitalization for cholecystitis. This was severe cholecystitis and she ended up needing an ERCP with clot and sludge removed from her bile duct. Her original CT showed possible gallbladder fistula but this proved to be negative on an MRCP. She was finally discharged home on oral antibiotics. She reports she still has some discomfort in the right upper quadrant and is now finished her course of antibiotics.  Past Medical History:  Diagnosis Date  . Angina   . Arthritis    HANDS"  . Asthma   . Cancer ([name]) 09/04/2017   Right Breast  . Carpal tunnel syndrome, bilateral 11/26/2016  . Cholelithiasis   . Cocaine abuse ([name]) 07/2012   per [facility] drug screen  . Coronary artery disease   . Diabetes mellitus without mention of complication    type 2  . Dysphagia    esophageal dysmotility on 03/2011 esophagram   . Dyspnea    ongoing   . Fatty liver disease, nonalcoholic 5852   on Imaging.   Marland Kitchen GERD (gastroesophageal reflux disease)   . Headache   . History of colonic polyps 2009   adenomatous 2009, HP in 2009, 2011, 2013.   Marland Kitchen Hyperlipidemia   . Hypertension   . Iron deficiency anemia   . Myocardial infarction ([name])    years ago, 6 stents placed  . Ulnar neuropathy at elbow, 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         Past Surgical History:  Procedure Laterality Date  . ABDOMINAL ANGIOGRAM  02/20/2011   Procedure: ABDOMINAL ANGIOGRAM;  Surgeon: Clent Demark, MD;  Location: [facility] CATH LAB;  Service: Cardiovascular;;  . CARPAL TUNNEL RELEASE Right 07/04/2017   Procedure: RIGHT CARPAL TUNNEL  RELEASE;  Surgeon: Leanora Cover, MD;  Location: [place];  Service: Orthopedics;  Laterality: Right;  . CARPAL TUNNEL RELEASE Left 09/12/2017   Procedure: LEFT CARPAL TUNNEL RELEASE;  Surgeon: Leanora Cover, MD;  Location: [place];  Service: Orthopedics;  Laterality: Left;  . COLONOSCOPY  11/30/2011   Procedure: COLONOSCOPY;  Surgeon: Lafayette Dragon, MD;  Location: [facility] ENDOSCOPY;  Service: Endoscopy;  Laterality: N/A;  . CORONARY ANGIOPLASTY WITH STENT PLACEMENT    . ESOPHAGOGASTRODUODENOSCOPY  11/30/2011   Procedure: ESOPHAGOGASTRODUODENOSCOPY (EGD);  Surgeon: Lafayette Dragon, MD;  Location: [facility];  Service: Endoscopy;  Laterality: N/A;  . ESOPHAGOGASTRODUODENOSCOPY N/A 02/23/2015   Procedure: ESOPHAGOGASTRODUODENOSCOPY (EGD);  Surgeon: Gatha Mayer, MD;  Location: [facility] ENDOSCOPY;  Service: Endoscopy;  Laterality: N/A;  . LEFT HEART CATHETERIZATION WITH CORONARY ANGIOGRAM N/A 02/20/2011   Procedure: LEFT HEART CATHETERIZATION WITH CORONARY ANGIOGRAM;  Surgeon: Clent Demark, MD;  Location: [place] CATH LAB;  Service: Cardiovascular;  Laterality: N/A;  . MASTECTOMY Right 11/05/2017  . MASTECTOMY W/ SENTINEL NODE BIOPSY Right 11/27/2017  . MASTECTOMY W/ SENTINEL NODE BIOPSY Right 11/27/2017   Procedure: RIGHT TOTAL MASTECTOMY WITH SENTINEL LYMPH NODE BIOPSY;  Surgeon: Excell Seltzer, MD;  Location: [place];  Service: General;  Laterality: Right;  . PORT A CATH REVISION Left 05/07/2018   Procedure: PORT A CATH REVISION;  Surgeon: Excell Seltzer, MD;  Location: [facility] ORS;  Service:  General;  Laterality: Left;  . PORTACATH PLACEMENT Left 11/27/2017   Procedure: INSERTION PORT-A-CATH;  Surgeon: Excell Seltzer, MD;  Location: [place];  Service: General;  Laterality: Left;  . RIGHT COLECTOMY  02/2007   for post polypectomy colonic perforation  . TUBAL LIGATION       Allergies ([name], CMA; 10/21/2018 9:09 AM) No Known Drug Allergies   [09/17/2017]:  Medication History ([name], CMA; 10/21/2018 9:14 AM) Tylenol (325MG  Tablet, Oral) Active. Proventil HFA (108 (90 Base)MCG/ACT Aerosol Soln, Inhalation) Active. Allopurinol (100MG  Tablet, Oral) Active. ALPRAZolam (0.25MG  Tablet, Oral) Active. Amoxicillin-Pot Clavulanate (875-125MG  Tablet, Oral) Active. Anastrozole (1MG  Tablet, Oral) Active. Aspirin (81MG  Tablet DR, Oral) Active. Lipitor (20MG  Tablet, Oral) Active. Symbicort (160-4.5MCG/ACT Aerosol, Inhalation) Active. Colchicine (0.6MG  Tablet, Oral) Active. Fish Oil (1000MG  Capsule, Oral) Active. Lantus SoloStar (100UNIT/ML Soln Pen-inj, Subcutaneous) Active. Imodium (2MG  Capsule, Oral) Active. Magnesium Oxide (400 (241.3 Mg)MG Tablet, Oral) Active. Metoprolol Succinate ER (50MG  Tablet ER 24HR, Oral) Active. Nitrostat (0.4MG  Tab Sublingual, Sublingual) Active. Ondansetron HCl (8MG  Tablet, Oral) Active. Protonix (40MG  Tablet DR, Oral) Active. traMADol HCl (50MG  Tablet, Oral) Active. Saccharomyces boulardii (50MG  Capsule, Oral) Active. Medications Reconciled  Vitals ([name] CMA; 10/21/2018 9:15 AM) 10/21/2018 9:14 AM Weight: 128 lb Height: 59in Body Surface Area: 1.53 m Body Mass Index: 25.85 kg/m  Pulse: 71 (Regular)  BP: 118/88(Sitting, Left Arm, Standard)       Physical Exam ([name] A. Ninfa Linden MD; 10/21/2018 9:21 AM) The physical exam findings are as follows: Note: There is still some tenderness with some guarding which is very slight in her right upper quadrant. General in NAD Lungs clear CV RRR Skin without rash Ext warm    Assessment & Plan ([name] A. Ninfa Linden MD; 10/21/2018 9:22 AM)  CHRONIC CHOLECYSTITIS WITH CALCULUS (K80.10) Impression: Because she is remaining symptomatic, cholecystectomy is recommended. She remains higher risk for needing to convert from laparoscopic to open procedure. Without [name], I suspect she will have continued ongoing  problems from chronic cholecystitis and gallstones. I discussed this with her as well as her sister by phone. We discussed the [name] in detail. We will plan on doing this the week of October 5th.  We discussed the other risks which include but are not limited to bleeding, infection, injury to surrounding structures, the need for other procedures, cardiopulmonary issues, etc.

## 2018-11-11 ENCOUNTER — Encounter (HOSPITAL_COMMUNITY): Admission: AD | Disposition: A | Discharge status: Home / Self Care | Payer: Self-pay | Source: Home / Self Care

## 2018-11-11 ENCOUNTER — Ambulatory Visit (HOSPITAL_COMMUNITY): Payer: Medicare Other | Admitting: Physician Assistant

## 2018-11-11 ENCOUNTER — Other Ambulatory Visit: Payer: Self-pay

## 2018-11-11 ENCOUNTER — Ambulatory Visit (HOSPITAL_COMMUNITY): Payer: Medicare Other

## 2018-11-11 ENCOUNTER — Encounter (HOSPITAL_COMMUNITY): Payer: Self-pay | Admitting: *Deleted

## 2018-11-11 ENCOUNTER — Ambulatory Visit (HOSPITAL_COMMUNITY): Payer: Medicare Other | Admitting: Anesthesiology

## 2018-11-11 ENCOUNTER — Inpatient Hospital Stay (HOSPITAL_COMMUNITY)
Admission: AD | Admit: 2018-11-11 | Discharge: 2018-11-18 | DRG: 415 | Disposition: A | Discharge status: Home / Self Care | Payer: Medicare Other | Attending: Surgery | Admitting: Surgery

## 2018-11-11 DIAGNOSIS — Z794 Long term (current) use of insulin: Secondary | ICD-10-CM

## 2018-11-11 DIAGNOSIS — Z8249 Family history of ischemic heart disease and other diseases of the circulatory system: Secondary | ICD-10-CM

## 2018-11-11 DIAGNOSIS — K219 Gastro-esophageal reflux disease without esophagitis: Secondary | ICD-10-CM | POA: Diagnosis present

## 2018-11-11 DIAGNOSIS — R079 Chest pain, unspecified: Secondary | ICD-10-CM

## 2018-11-11 DIAGNOSIS — E785 Hyperlipidemia, unspecified: Secondary | ICD-10-CM | POA: Diagnosis present

## 2018-11-11 DIAGNOSIS — J449 Chronic obstructive pulmonary disease, unspecified: Secondary | ICD-10-CM | POA: Diagnosis present

## 2018-11-11 DIAGNOSIS — K811 Chronic cholecystitis: Secondary | ICD-10-CM | POA: Diagnosis present

## 2018-11-11 DIAGNOSIS — M19041 Primary osteoarthritis, right hand: Secondary | ICD-10-CM | POA: Diagnosis present

## 2018-11-11 DIAGNOSIS — Z20828 Contact with and (suspected) exposure to other viral communicable diseases: Secondary | ICD-10-CM | POA: Diagnosis present

## 2018-11-11 DIAGNOSIS — Z9011 Acquired absence of right breast and nipple: Secondary | ICD-10-CM

## 2018-11-11 DIAGNOSIS — Z833 Family history of diabetes mellitus: Secondary | ICD-10-CM | POA: Diagnosis not present

## 2018-11-11 DIAGNOSIS — E875 Hyperkalemia: Secondary | ICD-10-CM | POA: Diagnosis present

## 2018-11-11 DIAGNOSIS — N183 Chronic kidney disease, stage 3 unspecified: Secondary | ICD-10-CM | POA: Diagnosis present

## 2018-11-11 DIAGNOSIS — K8010 Calculus of gallbladder with chronic cholecystitis without obstruction: Principal | ICD-10-CM | POA: Diagnosis present

## 2018-11-11 DIAGNOSIS — Z87891 Personal history of nicotine dependence: Secondary | ICD-10-CM

## 2018-11-11 DIAGNOSIS — Z809 Family history of malignant neoplasm, unspecified: Secondary | ICD-10-CM

## 2018-11-11 DIAGNOSIS — D631 Anemia in chronic kidney disease: Secondary | ICD-10-CM | POA: Diagnosis present

## 2018-11-11 DIAGNOSIS — I252 Old myocardial infarction: Secondary | ICD-10-CM

## 2018-11-11 DIAGNOSIS — E1136 Type 2 diabetes mellitus with diabetic cataract: Secondary | ICD-10-CM | POA: Diagnosis present

## 2018-11-11 DIAGNOSIS — I129 Hypertensive chronic kidney disease with stage 1 through stage 4 chronic kidney disease, or unspecified chronic kidney disease: Secondary | ICD-10-CM | POA: Diagnosis present

## 2018-11-11 DIAGNOSIS — E1122 Type 2 diabetes mellitus with diabetic chronic kidney disease: Secondary | ICD-10-CM | POA: Diagnosis present

## 2018-11-11 DIAGNOSIS — C50411 Malignant neoplasm of upper-outer quadrant of right female breast: Secondary | ICD-10-CM | POA: Diagnosis present

## 2018-11-11 DIAGNOSIS — I2510 Atherosclerotic heart disease of native coronary artery without angina pectoris: Secondary | ICD-10-CM | POA: Diagnosis present

## 2018-11-11 DIAGNOSIS — D62 Acute posthemorrhagic anemia: Secondary | ICD-10-CM | POA: Diagnosis present

## 2018-11-11 DIAGNOSIS — K760 Fatty (change of) liver, not elsewhere classified: Secondary | ICD-10-CM | POA: Diagnosis present

## 2018-11-11 DIAGNOSIS — Z419 Encounter for procedure for purposes other than remedying health state, unspecified: Secondary | ICD-10-CM

## 2018-11-11 DIAGNOSIS — M19042 Primary osteoarthritis, left hand: Secondary | ICD-10-CM | POA: Diagnosis present

## 2018-11-11 DIAGNOSIS — Z955 Presence of coronary angioplasty implant and graft: Secondary | ICD-10-CM | POA: Diagnosis not present

## 2018-11-11 HISTORY — PX: INTRAOPERATIVE CHOLANGIOGRAM: SHX5230

## 2018-11-11 HISTORY — PX: CHOLECYSTECTOMY: SHX55

## 2018-11-11 LAB — GLUCOSE, CAPILLARY
Glucose-Capillary: 216 mg/dL — ABNORMAL HIGH (ref 70–99)
Glucose-Capillary: 244 mg/dL — ABNORMAL HIGH (ref 70–99)
Glucose-Capillary: 193 mg/dL — ABNORMAL HIGH (ref 70–99)

## 2018-11-11 SURGERY — LAPAROSCOPIC CHOLECYSTECTOMY WITH INTRAOPERATIVE CHOLANGIOGRAM
Anesthesia: General | Site: Abdomen

## 2018-11-11 MED ORDER — LIDOCAINE 2% (20 MG/ML) 5 ML SYRINGE
INTRAMUSCULAR | Status: AC
  Filled 2018-11-11: qty 5

## 2018-11-11 MED ORDER — OXYCODONE HCL 5 MG PO TABS
ORAL_TABLET | ORAL | Status: AC
  Filled 2018-11-11: qty 2

## 2018-11-11 MED ORDER — ACETAMINOPHEN 500 MG PO TABS
1000.0000 mg | ORAL_TABLET | ORAL | Status: AC
  Administered 2018-11-11: 08:00:00 1000 mg via ORAL

## 2018-11-11 MED ORDER — DIPHENHYDRAMINE HCL 25 MG PO CAPS: 25.0000 mg | ORAL_CAPSULE | Freq: Four times a day (QID) | ORAL | Status: DC | PRN

## 2018-11-11 MED ORDER — ALBUTEROL SULFATE (2.5 MG/3ML) 0.083% IN NEBU: 2.5000 mg | INHALATION_SOLUTION | Freq: Four times a day (QID) | RESPIRATORY_TRACT | Status: DC | PRN

## 2018-11-11 MED ORDER — ONDANSETRON HCL 4 MG/2ML IJ SOLN
INTRAMUSCULAR | Status: DC | PRN
  Administered 2018-11-11: 4 mg via INTRAVENOUS

## 2018-11-11 MED ORDER — BUPIVACAINE-EPINEPHRINE 0.25% -1:200000 IJ SOLN
INTRAMUSCULAR | Status: AC
  Filled 2018-11-11: qty 1

## 2018-11-11 MED ORDER — ONDANSETRON 4 MG PO TBDP: 4.0000 mg | ORAL_TABLET | Freq: Four times a day (QID) | ORAL | Status: DC | PRN

## 2018-11-11 MED ORDER — ROCURONIUM BROMIDE 10 MG/ML (PF) SYRINGE
PREFILLED_SYRINGE | INTRAVENOUS | Status: AC
  Filled 2018-11-11: qty 10

## 2018-11-11 MED ORDER — FENTANYL CITRATE (PF) 100 MCG/2ML IJ SOLN
INTRAMUSCULAR | Status: DC | PRN
  Administered 2018-11-11 (×3): 50 ug via INTRAVENOUS

## 2018-11-11 MED ORDER — DEXAMETHASONE SODIUM PHOSPHATE 10 MG/ML IJ SOLN
INTRAMUSCULAR | Status: DC | PRN
  Administered 2018-11-11: 10 mg via INTRAVENOUS

## 2018-11-11 MED ORDER — FENTANYL CITRATE (PF) 100 MCG/2ML IJ SOLN
25.0000 ug | INTRAMUSCULAR | Status: DC | PRN
  Administered 2018-11-11 (×2): 50 ug via INTRAVENOUS

## 2018-11-11 MED ORDER — SODIUM CHLORIDE 0.9 % IV SOLN
INTRAVENOUS | Status: DC
  Administered 2018-11-11 – 2018-11-16 (×7): via INTRAVENOUS

## 2018-11-11 MED ORDER — SUGAMMADEX SODIUM 200 MG/2ML IV SOLN
INTRAVENOUS | Status: DC | PRN
  Administered 2018-11-11: 120 mg via INTRAVENOUS

## 2018-11-11 MED ORDER — INSULIN ASPART 100 UNIT/ML ~~LOC~~ SOLN: 0.0000 [IU] | Freq: Every day | SUBCUTANEOUS | Status: DC

## 2018-11-11 MED ORDER — MORPHINE SULFATE (PF) 2 MG/ML IV SOLN
1.0000 mg | INTRAVENOUS | Status: DC | PRN
  Administered 2018-11-11: 2 mg via INTRAVENOUS
  Administered 2018-11-11: 4 mg via INTRAVENOUS
  Administered 2018-11-12: 14:00:00 2 mg via INTRAVENOUS
  Administered 2018-11-12: 19:00:00 4 mg via INTRAVENOUS
  Administered 2018-11-12: 2 mg via INTRAVENOUS
  Administered 2018-11-13 – 2018-11-15 (×2): 4 mg via INTRAVENOUS
  Filled 2018-11-11: qty 1
  Filled 2018-11-11 (×2): qty 2
  Filled 2018-11-11: qty 1
  Filled 2018-11-11: qty 2
  Filled 2018-11-11: qty 1
  Filled 2018-11-11: qty 2

## 2018-11-11 MED ORDER — ALLOPURINOL 100 MG PO TABS
100.0000 mg | ORAL_TABLET | Freq: Every evening | ORAL | Status: DC
  Administered 2018-11-11 – 2018-11-17 (×7): 100 mg via ORAL
  Filled 2018-11-11 (×7): qty 1

## 2018-11-11 MED ORDER — SODIUM CHLORIDE 0.9 % IV SOLN
INTRAVENOUS | Status: DC | PRN
  Administered 2018-11-11: 10:00:00 50 ug/min via INTRAVENOUS

## 2018-11-11 MED ORDER — EPHEDRINE SULFATE-NACL 50-0.9 MG/10ML-% IV SOSY
PREFILLED_SYRINGE | INTRAVENOUS | Status: DC | PRN
  Administered 2018-11-11 (×5): 10 mg via INTRAVENOUS

## 2018-11-11 MED ORDER — ACETAMINOPHEN 325 MG PO TABS: 325.0000 mg | ORAL_TABLET | Freq: Once | ORAL | Status: DC | PRN

## 2018-11-11 MED ORDER — MIDAZOLAM HCL 2 MG/2ML IJ SOLN
INTRAMUSCULAR | Status: AC
  Filled 2018-11-11: qty 2

## 2018-11-11 MED ORDER — METHOCARBAMOL 500 MG PO TABS
500.0000 mg | ORAL_TABLET | Freq: Four times a day (QID) | ORAL | Status: DC | PRN
  Administered 2018-11-11: 500 mg via ORAL

## 2018-11-11 MED ORDER — DEXAMETHASONE SODIUM PHOSPHATE 10 MG/ML IJ SOLN
INTRAMUSCULAR | Status: AC
  Filled 2018-11-11: qty 1

## 2018-11-11 MED ORDER — GABAPENTIN 300 MG PO CAPS
300.0000 mg | ORAL_CAPSULE | ORAL | Status: AC
  Administered 2018-11-11: 08:00:00 300 mg via ORAL

## 2018-11-11 MED ORDER — PHENYLEPHRINE 40 MCG/ML (10ML) SYRINGE FOR IV PUSH (FOR BLOOD PRESSURE SUPPORT)
PREFILLED_SYRINGE | INTRAVENOUS | Status: DC | PRN
  Administered 2018-11-11 (×2): 160 ug via INTRAVENOUS
  Administered 2018-11-11: 80 ug via INTRAVENOUS
  Administered 2018-11-11: 200 ug via INTRAVENOUS

## 2018-11-11 MED ORDER — INSULIN ASPART 100 UNIT/ML ~~LOC~~ SOLN
0.0000 [IU] | Freq: Three times a day (TID) | SUBCUTANEOUS | Status: DC
  Administered 2018-11-11: 12:00:00 3 [IU] via SUBCUTANEOUS
  Administered 2018-11-12: 13:00:00 1 [IU] via SUBCUTANEOUS
  Administered 2018-11-12: 18:00:00 2 [IU] via SUBCUTANEOUS
  Administered 2018-11-12 – 2018-11-13 (×2): 1 [IU] via SUBCUTANEOUS
  Administered 2018-11-13: 3 [IU] via SUBCUTANEOUS
  Administered 2018-11-14: 2 [IU] via SUBCUTANEOUS
  Administered 2018-11-15 (×2): 1 [IU] via SUBCUTANEOUS
  Administered 2018-11-16: 09:00:00 2 [IU] via SUBCUTANEOUS
  Administered 2018-11-16: 1 [IU] via SUBCUTANEOUS
  Administered 2018-11-17: 13:00:00 2 [IU] via SUBCUTANEOUS
  Administered 2018-11-17: 18:00:00 1 [IU] via SUBCUTANEOUS

## 2018-11-11 MED ORDER — ONDANSETRON HCL 4 MG/2ML IJ SOLN
INTRAMUSCULAR | Status: AC
  Filled 2018-11-11: qty 2

## 2018-11-11 MED ORDER — ROCURONIUM BROMIDE 50 MG/5ML IV SOSY
PREFILLED_SYRINGE | INTRAVENOUS | Status: DC | PRN
  Administered 2018-11-11 (×2): 20 mg via INTRAVENOUS
  Administered 2018-11-11: 50 mg via INTRAVENOUS
  Administered 2018-11-11: 10 mg via INTRAVENOUS

## 2018-11-11 MED ORDER — METOPROLOL SUCCINATE ER 25 MG PO TB24
50.0000 mg | ORAL_TABLET | Freq: Every day | ORAL | Status: DC
  Administered 2018-11-12 – 2018-11-18 (×6): 50 mg via ORAL
  Filled 2018-11-11 (×6): qty 2

## 2018-11-11 MED ORDER — 0.9 % SODIUM CHLORIDE (POUR BTL) OPTIME
TOPICAL | Status: DC | PRN
  Administered 2018-11-11: 1000 mL

## 2018-11-11 MED ORDER — ACETAMINOPHEN 10 MG/ML IV SOLN
1000.0000 mg | Freq: Once | INTRAVENOUS | Status: DC | PRN
  Administered 2018-11-11: 1000 mg via INTRAVENOUS

## 2018-11-11 MED ORDER — MEPERIDINE HCL 25 MG/ML IJ SOLN: 6.2500 mg | INTRAMUSCULAR | Status: DC | PRN

## 2018-11-11 MED ORDER — CEFAZOLIN SODIUM-DEXTROSE 2-4 GM/100ML-% IV SOLN
INTRAVENOUS | Status: AC
  Filled 2018-11-11: qty 100

## 2018-11-11 MED ORDER — ACETAMINOPHEN 160 MG/5ML PO SOLN: 325.0000 mg | Freq: Once | ORAL | Status: DC | PRN

## 2018-11-11 MED ORDER — INSULIN ASPART 100 UNIT/ML ~~LOC~~ SOLN
SUBCUTANEOUS | Status: AC
  Filled 2018-11-11: qty 1

## 2018-11-11 MED ORDER — PROPOFOL 10 MG/ML IV BOLUS
INTRAVENOUS | Status: AC
  Filled 2018-11-11: qty 20

## 2018-11-11 MED ORDER — ENOXAPARIN SODIUM 30 MG/0.3ML ~~LOC~~ SOLN: 30.0000 mg | SUBCUTANEOUS | Status: DC

## 2018-11-11 MED ORDER — BUPIVACAINE-EPINEPHRINE 0.25% -1:200000 IJ SOLN
INTRAMUSCULAR | Status: DC | PRN
  Administered 2018-11-11: 20 mL

## 2018-11-11 MED ORDER — LACTATED RINGERS IV SOLN: INTRAVENOUS | Status: DC

## 2018-11-11 MED ORDER — MIDAZOLAM HCL 5 MG/5ML IJ SOLN
INTRAMUSCULAR | Status: DC | PRN
  Administered 2018-11-11: 1 mg via INTRAVENOUS

## 2018-11-11 MED ORDER — CHLORHEXIDINE GLUCONATE CLOTH 2 % EX PADS: 6.0000 | MEDICATED_PAD | Freq: Once | CUTANEOUS | Status: DC

## 2018-11-11 MED ORDER — PROPOFOL 10 MG/ML IV BOLUS
INTRAVENOUS | Status: DC | PRN
  Administered 2018-11-11: 80 mg via INTRAVENOUS

## 2018-11-11 MED ORDER — ACETAMINOPHEN 10 MG/ML IV SOLN
INTRAVENOUS | Status: AC
  Filled 2018-11-11: qty 100

## 2018-11-11 MED ORDER — ENSURE ENLIVE PO LIQD
237.0000 mL | Freq: Two times a day (BID) | ORAL | Status: DC
  Administered 2018-11-12 – 2018-11-18 (×10): 237 mL via ORAL
  Filled 2018-11-11: qty 237

## 2018-11-11 MED ORDER — TRAMADOL HCL 50 MG PO TABS
50.0000 mg | ORAL_TABLET | Freq: Four times a day (QID) | ORAL | Status: DC | PRN
  Administered 2018-11-11: 50 mg via ORAL
  Filled 2018-11-11: qty 1

## 2018-11-11 MED ORDER — OXYCODONE HCL 5 MG PO TABS
5.0000 mg | ORAL_TABLET | ORAL | Status: DC | PRN
  Administered 2018-11-11 – 2018-11-15 (×5): 10 mg via ORAL
  Administered 2018-11-17: 5 mg via ORAL
  Filled 2018-11-11 (×5): qty 2
  Filled 2018-11-11: qty 1

## 2018-11-11 MED ORDER — FENTANYL CITRATE (PF) 100 MCG/2ML IJ SOLN
INTRAMUSCULAR | Status: AC
  Filled 2018-11-11: qty 2

## 2018-11-11 MED ORDER — DIPHENHYDRAMINE HCL 50 MG/ML IJ SOLN: 25.0000 mg | Freq: Four times a day (QID) | INTRAMUSCULAR | Status: DC | PRN

## 2018-11-11 MED ORDER — GABAPENTIN 300 MG PO CAPS
300.0000 mg | ORAL_CAPSULE | Freq: Two times a day (BID) | ORAL | Status: DC
  Administered 2018-11-11 – 2018-11-13 (×5): 300 mg via ORAL
  Filled 2018-11-11 (×5): qty 1

## 2018-11-11 MED ORDER — ACETAMINOPHEN 500 MG PO TABS
ORAL_TABLET | ORAL | Status: AC
  Administered 2018-11-11: 08:00:00 1000 mg via ORAL
  Filled 2018-11-11: qty 2

## 2018-11-11 MED ORDER — AMLODIPINE BESYLATE 5 MG PO TABS
5.0000 mg | ORAL_TABLET | Freq: Every day | ORAL | Status: DC
  Administered 2018-11-12 – 2018-11-18 (×6): 5 mg via ORAL
  Filled 2018-11-11 (×6): qty 1

## 2018-11-11 MED ORDER — GABAPENTIN 300 MG PO CAPS
ORAL_CAPSULE | ORAL | Status: AC
  Administered 2018-11-11: 300 mg via ORAL
  Filled 2018-11-11: qty 1

## 2018-11-11 MED ORDER — ONDANSETRON HCL 4 MG/2ML IJ SOLN: 4.0000 mg | Freq: Four times a day (QID) | INTRAMUSCULAR | Status: DC | PRN

## 2018-11-11 MED ORDER — LACTATED RINGERS IV SOLN
INTRAVENOUS | Status: DC
  Administered 2018-11-11: 10:00:00 via INTRAVENOUS
  Administered 2018-11-11: 08:00:00 1000 mL via INTRAVENOUS

## 2018-11-11 MED ORDER — ALBUMIN HUMAN 5 % IV SOLN
INTRAVENOUS | Status: DC | PRN
  Administered 2018-11-11: 10:00:00 via INTRAVENOUS

## 2018-11-11 MED ORDER — METHOCARBAMOL 500 MG PO TABS
ORAL_TABLET | ORAL | Status: AC
  Filled 2018-11-11: qty 1

## 2018-11-11 MED ORDER — CEFAZOLIN SODIUM-DEXTROSE 2-4 GM/100ML-% IV SOLN
2.0000 g | INTRAVENOUS | Status: AC
  Administered 2018-11-11: 09:00:00 2 g via INTRAVENOUS

## 2018-11-11 MED ORDER — FENTANYL CITRATE (PF) 250 MCG/5ML IJ SOLN
INTRAMUSCULAR | Status: AC
  Filled 2018-11-11: qty 5

## 2018-11-11 MED ORDER — PROMETHAZINE HCL 25 MG/ML IJ SOLN: 6.2500 mg | INTRAMUSCULAR | Status: DC | PRN

## 2018-11-11 MED ORDER — LIDOCAINE 2% (20 MG/ML) 5 ML SYRINGE
INTRAMUSCULAR | Status: DC | PRN
  Administered 2018-11-11: 60 mg via INTRAVENOUS

## 2018-11-11 MED ORDER — SODIUM CHLORIDE 0.9 % IR SOLN
Status: DC | PRN
  Administered 2018-11-11: 1000 mL

## 2018-11-11 MED ORDER — SODIUM CHLORIDE 0.9 % IV SOLN
INTRAVENOUS | Status: DC | PRN
  Administered 2018-11-11: 100 mL

## 2018-11-11 SURGICAL SUPPLY — 70 items
ADH SKN CLS APL DERMABOND .7 (GAUZE/BANDAGES/DRESSINGS) ×1
APL PRP STRL LF DISP 70% ISPRP (MISCELLANEOUS) ×1
APPLIER CLIP 5 13 M/L LIGAMAX5 (MISCELLANEOUS) ×3
APR CLP MED LRG 5 ANG JAW (MISCELLANEOUS) ×1
BAG SPEC RTRVL LRG 6X4 10 (ENDOMECHANICALS)
BIOPATCH RED 1 DISK 7.0 (GAUZE/BANDAGES/DRESSINGS) ×1 IMPLANT
BIOPATCH RED 1IN DISK 7.0MM (GAUZE/BANDAGES/DRESSINGS) ×1
BLADE CLIPPER SURG (BLADE) IMPLANT
CANISTER SUCT 3000ML PPV (MISCELLANEOUS) ×3 IMPLANT
CATH REDDICK CHOLANGI 4FR 50CM (CATHETERS) ×2 IMPLANT
CHLORAPREP W/TINT 26 (MISCELLANEOUS) ×3 IMPLANT
CLIP APPLIE 5 13 M/L LIGAMAX5 (MISCELLANEOUS) ×1 IMPLANT
CLIP VESOCCLUDE MED 6/CT (CLIP) ×2 IMPLANT
COVER MAYO STAND STRL (DRAPES) ×2 IMPLANT
COVER SURGICAL LIGHT HANDLE (MISCELLANEOUS) ×3 IMPLANT
COVER WAND RF STERILE (DRAPES) ×1 IMPLANT
DERMABOND ADVANCED (GAUZE/BANDAGES/DRESSINGS) ×2
DERMABOND ADVANCED .7 DNX12 (GAUZE/BANDAGES/DRESSINGS) ×1 IMPLANT
DRAIN CHANNEL 19F RND (DRAIN) ×2 IMPLANT
DRAPE C-ARM 42X72 X-RAY (DRAPES) IMPLANT
DRSG OPSITE POSTOP 4X8 (GAUZE/BANDAGES/DRESSINGS) ×2 IMPLANT
DRSG TEGADERM 2-3/8X2-3/4 SM (GAUZE/BANDAGES/DRESSINGS) ×2 IMPLANT
ELECT BLADE 4.0 EZ CLEAN MEGAD (MISCELLANEOUS) ×3
ELECT CAUTERY BLADE 6.4 (BLADE) ×2 IMPLANT
ELECT REM PT RETURN 9FT ADLT (ELECTROSURGICAL) ×3
ELECTRODE BLDE 4.0 EZ CLN MEGD (MISCELLANEOUS) IMPLANT
ELECTRODE REM PT RTRN 9FT ADLT (ELECTROSURGICAL) ×1 IMPLANT
EVACUATOR SILICONE 100CC (DRAIN) ×2 IMPLANT
GLOVE BIO SURGEON STRL SZ 6.5 (GLOVE) ×1 IMPLANT
GLOVE BIO SURGEON STRL SZ7.5 (GLOVE) ×2 IMPLANT
GLOVE BIO SURGEONS STRL SZ 6.5 (GLOVE) ×1
GLOVE INDICATOR 7.5 STRL GRN (GLOVE) ×2 IMPLANT
GLOVE SURG ORTHO 8.0 STRL STRW (GLOVE) ×2 IMPLANT
GLOVE SURG SIGNA 7.5 PF LTX (GLOVE) ×3 IMPLANT
GOWN STRL REUS W/ TWL LRG LVL3 (GOWN DISPOSABLE) ×2 IMPLANT
GOWN STRL REUS W/ TWL XL LVL3 (GOWN DISPOSABLE) ×1 IMPLANT
GOWN STRL REUS W/TWL LRG LVL3 (GOWN DISPOSABLE) ×6
GOWN STRL REUS W/TWL XL LVL3 (GOWN DISPOSABLE) ×6
KIT BASIN OR (CUSTOM PROCEDURE TRAY) ×3 IMPLANT
KIT TURNOVER KIT B (KITS) ×3 IMPLANT
NS IRRIG 1000ML POUR BTL (IV SOLUTION) ×3 IMPLANT
PAD ARMBOARD 7.5X6 YLW CONV (MISCELLANEOUS) ×3 IMPLANT
PENCIL SMOKE EVACUATOR (MISCELLANEOUS) ×2 IMPLANT
POUCH SPECIMEN RETRIEVAL 10MM (ENDOMECHANICALS) ×1 IMPLANT
SCISSORS LAP 5X35 DISP (ENDOMECHANICALS) ×3 IMPLANT
SET CHOLANGIOGRAPH 5 50 .035 (SET/KITS/TRAYS/PACK) ×2 IMPLANT
SET IRRIG TUBING LAPAROSCOPIC (IRRIGATION / IRRIGATOR) ×3 IMPLANT
SET TUBE SMOKE EVAC HIGH FLOW (TUBING) ×3 IMPLANT
SLEEVE ENDOPATH XCEL 5M (ENDOMECHANICALS) ×6 IMPLANT
SLEEVE SUCTION CATH 165 (SLEEVE) ×2 IMPLANT
SPECIMEN JAR SMALL (MISCELLANEOUS) ×3 IMPLANT
SPONGE LAP 18X18 RF (DISPOSABLE) ×4 IMPLANT
STAPLER VISISTAT 35W (STAPLE) ×2 IMPLANT
SUT ETHILON 2 0 FS 18 (SUTURE) ×2 IMPLANT
SUT MNCRL AB 4-0 PS2 18 (SUTURE) ×3 IMPLANT
SUT PDS AB 1 CT  36 (SUTURE) ×6
SUT PDS AB 1 CT 36 (SUTURE) IMPLANT
SUT PDS AB 2-0 CT1 27 (SUTURE) ×2 IMPLANT
SUT PDS AB 4-0 P3 18 (SUTURE) ×2 IMPLANT
SUT SILK 2 0 TIES 10X30 (SUTURE) ×2 IMPLANT
SUT SILK 3 0 SH CR/8 (SUTURE) ×2 IMPLANT
TOWEL GREEN STERILE (TOWEL DISPOSABLE) ×3 IMPLANT
TOWEL GREEN STERILE FF (TOWEL DISPOSABLE) ×3 IMPLANT
TRAY LAPAROSCOPIC MC (CUSTOM PROCEDURE TRAY) ×3 IMPLANT
TROCAR XCEL BLUNT TIP 100MML (ENDOMECHANICALS) ×3 IMPLANT
TROCAR XCEL NON-BLD 5MMX100MML (ENDOMECHANICALS) ×3 IMPLANT
TUBE CONNECTING 20'X1/4 (TUBING) ×1
TUBE CONNECTING 20X1/4 (TUBING) ×1 IMPLANT
WATER STERILE IRR 1000ML POUR (IV SOLUTION) ×3 IMPLANT
YANKAUER SUCT BULB TIP NO VENT (SUCTIONS) ×2 IMPLANT

## 2018-11-11 NOTE — Discharge Instructions (Signed)
[facility]      [facility] [phone]  OPEN ABDOMINAL SURGERY: POST OP INSTRUCTIONS  Always review your discharge instruction sheet given to you by the facility where your surgery was performed.  IF YOU HAVE DISABILITY OR FAMILY LEAVE FORMS, YOU MUST BRING THEM TO THE OFFICE FOR PROCESSING.  PLEASE DO NOT GIVE THEM TO YOUR DOCTOR.  1. A prescription for pain medication may be given to you upon discharge.  Take your pain medication as prescribed, if needed.  If narcotic pain medicine is not needed, then you may take acetaminophen (Tylenol) or ibuprofen (Advil) as needed. 2. Take your usually prescribed medications unless otherwise directed. 3. If you need a refill on your pain medication, please contact your pharmacy. They will contact our office to request authorization.  Prescriptions will not be filled after 5pm or on week-ends. 4. You should follow a light diet the first few days after arrival home, such as soup and crackers, pudding, etc.unless your doctor has advised otherwise. A high-fiber, low fat diet can be resumed as tolerated.   Be sure to include lots of fluids daily. Most patients will experience some swelling and bruising on the chest and neck area.  Ice packs will help.  Swelling and bruising can take several days to resolve 5. Most patients will experience some swelling and bruising in the area of the incision. Ice pack will help. Swelling and bruising can take several days to resolve..  6. It is common to experience some constipation if taking pain medication after surgery.  Increasing fluid intake and taking a stool softener will usually help or prevent this problem from occurring.  A mild laxative (Milk of Magnesia or Miralax) should be taken according to package directions if there are no bowel movements after 48 hours. 7.  You may have steri-strips (small skin tapes) in place directly over the incision.  These strips should be left on the skin for 7-10 days.  If your  surgeon used skin glue on the incision, you may shower in 24 hours.  The glue will flake off over the next 2-3 weeks.  Any sutures or staples will be removed at the office during your follow-up visit. You may find that a light gauze bandage over your incision may keep your staples from being rubbed or pulled. You may shower and replace the bandage daily. 8. ACTIVITIES:  You may resume regular (light) daily activities beginning the next day--such as daily self-care, walking, climbing stairs--gradually increasing activities as tolerated.  You may have sexual intercourse when it is comfortable.  Refrain from any heavy lifting or straining until approved by your doctor. a. You may drive when you no longer are taking prescription pain medication, you can comfortably wear a seatbelt, and you can safely maneuver your car and apply brakes b. Return to Work: ___________________________________ 9. You should see your doctor in the office for a follow-up appointment approximately two weeks after your surgery.  Make sure that you call for this appointment within a day or two after you arrive home to insure a convenient appointment time. OTHER INSTRUCTIONS:  _____________________________________________________________ _____________________________________________________________  WHEN TO CALL YOUR DOCTOR: 1. Fever over 101.0 2. Inability to urinate 3. Nausea and/or vomiting 4. Extreme swelling or bruising 5. Continued bleeding from incision. 6. Increased pain, redness, or drainage from the incision. 7. Difficulty swallowing or breathing 8. Muscle cramping or spasms. 9. Numbness or tingling in hands or feet or around lips.  The clinic staff is available to   answer your questions during regular business hours.  Please don't hesitate to call and ask to speak to one of the nurses if you have concerns.  For further questions, please visit [email or web address]   

## 2018-11-11 NOTE — Interval H&P Note (Signed)
History and Physical Interval Note: no change in H and P  11/11/2018 7:40 AM  [name]  has presented today for surgery, with the diagnosis of [place].  The various methods of treatment have been discussed with the patient and family. After consideration of risks, benefits and other options for treatment, the patient has consented to  Procedure(s): LAPAROSCOPIC, POSSIBLE OPEN, CHOLECYSTECTOMY WITH POSSIBLE INTRAOPERATIVE CHOLANGIOGRAM (N/A) as a surgical intervention.  The patient's history has been reviewed, patient examined, no change in status, stable for surgery.  I have reviewed the patient's chart and labs.  Questions were answered to the patient's satisfaction.     Coralie Keens

## 2018-11-11 NOTE — Anesthesia Procedure Notes (Signed)
Procedure Name: Intubation Date/Time: 11/11/2018 8:35 AM Performed by: Lance Coon, CRNA Pre-anesthesia Checklist: Patient identified, Emergency Drugs available, Suction available, Patient being monitored and Timeout performed Patient Re-evaluated:Patient Re-evaluated prior to induction Oxygen Delivery Method: Circle system utilized Preoxygenation: Pre-oxygenation with 100% oxygen Induction Type: IV induction Ventilation: Mask ventilation without difficulty Laryngoscope Size: Miller and 2 Grade View: Grade I Tube type: Oral Tube size: 7.0 mm Number of attempts: 1 Airway Equipment and Method: Stylet Placement Confirmation: ETT inserted through vocal cords under direct vision,  positive ETCO2 and breath sounds checked- equal and bilateral Secured at: 21 cm Tube secured with: Tape Dental Injury: Teeth and Oropharynx as per pre-operative assessment

## 2018-11-11 NOTE — Op Note (Signed)
[name] 11/11/2018   Pre-op Diagnosis: CHRONIC CHOLECYSTITIS WITH GALL STONES     Post-op Diagnosis: same  Procedure(s): LAPAROSCOPIC CONVERTED TO OPEN CHOLECYSTECTOMY INTRAOPERATIVE CHOLANGIOGRAM  Surgeon(s): Coralie Keens, MD Armandina Gemma, MD  Anesthesia: General  Staff:  Circulator: Carlynn Purl, RN Scrub Person: Quincy Carnes, RN; Dollene Cleveland T  Estimated Blood Loss: Minimal               Specimens: sent to path  Indications: This is a 68 year old female with a history of breast cancer who is actively undergoing chemotherapy who presented with cholecystitis in August.  A CT scan demonstrated severe inflammatory process with the possibility of a fistula to the gallbladder.  HIDA scan was positive for cholecystitis.  Given the intense inflammatory process, the decision was made to hold on a cholecystectomy.  She, however, was not a candidate for percutaneous cholecystostomy by interventional radiology.  She slowly improved.  An MRCP was performed which showed no evidence that this was a gallbladder malignancy or a fistula present.  She was eventually discharged on antibiotics but represented with elevated liver function test.  She underwent an ERCP with sludge and clots removed from the bile duct by gastroenterology.  She continues to be symptomatic from her gallbladder disease while still undergoing chemotherapy the decision was made to proceed with a cholecystectomy.  Findings: The patient has severe chronic cholecystitis with the duodenum and right colon fixated to the lower portion of the gallbladder necessitating a conversion to an open procedure.  Once the gallbladder was removed, a cholangiogram was performed showing a normal bile duct.  Procedure: The patient was brought to the operating room and identifies correct patient.  She is placed prone on the operating table general anesthesia was induced.  Her abdomen was then prepped and draped in usual  sterile fashion.  I made a small incision just below the umbilicus with a scalpel.  I carried this down to the fascia which was then opened with a scalpel.  Hemostat was then used to pass to the peritoneal cavity under direct vision.  A 0 Vicryl pursing suture was placed around the fascial opening.  The [facility] port was placed the opening and insufflation of the abdomen was begun.  The patient had a large amount of intra-abdominal adhesions from her previous abdominal surgery.  I was able to place a 5 mm trocar in the patient's epigastrium and takedown several of the adhesions with laparoscopic scissors.  2 more 5 mm trochars were placed under direct vision the right upper quadrant.  Omentum was stuck to the surface of the gallbladder.  I was able to take this down bluntly as well as with the cautery.  There was an intense inflammatory reaction in the right upper quadrant.  As I attempted to dissect out the gallbladder became apparent that the duodenum and right colon were stuck to the gallbladder.  I was able to slowly freed the colon off but could not free the duodenum off of the gallbladder safely.  At this point decision was made to convert to an open procedure.  All trochars were removed and the abdomen was deflated.  I then created an incision with in the right upper quadrant incorporating to the trocar sites with a scalpel.  I took this down through the muscle layers and fascia with electrocautery and then open the abdomen.  We again were able to visualize the upper surface of the gallbladder.  We were then slowly able to  dissect the duodenum off of the wall of the gallbladder.  No fistula was identified.  At this point we started taking the gallbladder off the liver and a dome down fashion with the cautery.  The gallbladder back wall was fixated to the liver so we open the gallbladder removed several large stones.  We were then able to get the rest of the gallbladder off of the liver and dissect down toward  the fundus of the gallbladder.  Several small bridging vessels were controlled with surgical clips.  We finally identify what appeared to be a cystic artery and clipped it and was transected.  We then dissected down to what appeared to be the cystic stump.  At this point we could visualize most of the bile duct as well.  We excised the gallbladder leaving a cuffed at the cystic stump.  We then placed a cholangiocatheter into the cystic stump and performed a cholangiogram with contrast.  This showed good flow of contrast through the common bile duct up into the liver as well as down into the duodenum without evidence of injury.  The cholangiocatheter was then removed.  I then closed the cystic stump with a running 4-0 PDS suture.  We then irrigated the right upper quadrant with a liter of normal saline.  No evidence of bile leak was identified.  Hemostasis appeared to be achieved.  I then placed a 23 [place] Blake drain through the most lateral port site and placed in the liver bed and gallbladder fossa.  This was sutured in place with nylon suture.  I then closed the peritoneum posterior fascia with a running #1 PDS suture.  I then closed the anterior fascia with running #1 PDS suture as well.  I anesthetized the fascia with Marcaine and then closed the skin with staples.  The small incision in the umbilicus was closed with staples well.  Gauze and tape were then applied.  The patient tolerated procedure well.  All the counts were correct at the end of procedure.  The patient was then extubated in the operating room and taken in stable addition to the recovery room.          Coralie Keens   Date: 11/11/2018  Time: 11:08 AM

## 2018-11-11 NOTE — Transfer of Care (Signed)
Immediate Anesthesia Transfer of Care Note  Patient: [name]  Procedure(s) Performed: ATTEMPTED LAPAROSCOPIC CHOLECUSTECOMY,  OPEN CHOLECYSTECTOMY (N/A Abdomen) Intraoperative Cholangiogram (N/A Abdomen)  Patient Location: PACU  Anesthesia Type:General  Level of Consciousness: drowsy and patient cooperative  Airway & Oxygen Therapy: Patient Spontanous Breathing and Patient connected to face mask oxygen  Post-op Assessment: Report given to RN and Post -op Vital signs reviewed and stable  Post vital signs: Reviewed and stable  Last Vitals:  Vitals Value Taken Time  BP    Temp    Pulse    Resp    SpO2      Last Pain:  Vitals:   11/11/18 0805  TempSrc:   PainSc: 4       Patients Stated Pain Goal: 3 (78/41/28 2081)  Complications: No apparent anesthesia complications

## 2018-11-11 NOTE — Progress Notes (Signed)
Pt admitted to 6N19 s/p open chole with 1 JP drain.  Pt placed on O2/2L [place].

## 2018-11-11 NOTE — Anesthesia Postprocedure Evaluation (Signed)
Anesthesia Post Note  Patient: [name]  Procedure(s) Performed: ATTEMPTED LAPAROSCOPIC CHOLECUSTECOMY,  OPEN CHOLECYSTECTOMY (N/A Abdomen) Intraoperative Cholangiogram (N/A Abdomen)     Patient location during evaluation: PACU Anesthesia Type: General Level of consciousness: awake and alert Pain management: pain level controlled Vital Signs Assessment: post-procedure vital signs reviewed and stable Respiratory status: spontaneous breathing, nonlabored ventilation, respiratory function stable and patient connected to nasal cannula oxygen Cardiovascular status: blood pressure returned to baseline and stable Postop Assessment: no apparent nausea or vomiting Anesthetic complications: no    Last Vitals:  Vitals:   11/11/18 1245 11/11/18 1408  BP: 103/60 (!) 91/53  Pulse: 65 72  Resp: 10 19  Temp:    SpO2: 100% 100%    Last Pain:  Vitals:   11/11/18 1400  TempSrc:   PainSc: Asleep                 Effie Berkshire

## 2018-11-11 NOTE — Progress Notes (Signed)
CHG bath orders keep popping up to be ordered but pt does not have a foley or central line that is accessed.  She does have a Porta cath that is not accessed on the left.

## 2018-11-11 NOTE — Progress Notes (Signed)
Pt has a history of breast cancer on the right, old healed mastectomy on the right, pink restricted arm band on right.  Pt has a left PAC implanted, not accessed.

## 2018-11-12 ENCOUNTER — Encounter (HOSPITAL_COMMUNITY): Payer: Self-pay | Admitting: Surgery

## 2018-11-12 LAB — GLUCOSE, CAPILLARY
Glucose-Capillary: 166 mg/dL — ABNORMAL HIGH (ref 70–99)
Glucose-Capillary: 153 mg/dL — ABNORMAL HIGH (ref 70–99)
Glucose-Capillary: 151 mg/dL — ABNORMAL HIGH (ref 70–99)
Glucose-Capillary: 172 mg/dL — ABNORMAL HIGH (ref 70–99)
Glucose-Capillary: 141 mg/dL — ABNORMAL HIGH (ref 70–99)
Glucose-Capillary: 194 mg/dL — ABNORMAL HIGH (ref 70–99)
Glucose-Capillary: 119 mg/dL — ABNORMAL HIGH (ref 70–99)

## 2018-11-12 LAB — COMPREHENSIVE METABOLIC PANEL
ALT: 60 U/L — ABNORMAL HIGH (ref 0–44)
AST: 67 U/L — ABNORMAL HIGH (ref 15–41)
Albumin: 3.1 g/dL — ABNORMAL LOW (ref 3.5–5.0)
Alkaline Phosphatase: 163 U/L — ABNORMAL HIGH (ref 38–126)
Anion gap: 9 (ref 5–15)
BUN: 26 mg/dL — ABNORMAL HIGH (ref 8–23)
CO2: 19 mmol/L — ABNORMAL LOW (ref 22–32)
Calcium: 8.6 mg/dL — ABNORMAL LOW (ref 8.9–10.3)
Chloride: 107 mmol/L (ref 98–111)
Creatinine, Ser: 1.98 mg/dL — ABNORMAL HIGH (ref 0.44–1.00)
GFR calc Af Amer: 29 mL/min — ABNORMAL LOW (ref >60)
GFR calc non Af Amer: 25 mL/min — ABNORMAL LOW (ref >60)
Glucose, Bld: 162 mg/dL — ABNORMAL HIGH (ref 70–99)
Potassium: 5.4 mmol/L — ABNORMAL HIGH (ref 3.5–5.1)
Sodium: 135 mmol/L (ref 135–145)
Total Bilirubin: 0.4 mg/dL (ref 0.3–1.2)
Total Protein: 6.7 g/dL (ref 6.5–8.1)

## 2018-11-12 LAB — CBC
HCT: 16 % — ABNORMAL LOW (ref 36.0–46.0)
Hemoglobin: 4.9 g/dL — CL (ref 12.0–15.0)
MCH: 30.1 pg (ref 26.0–34.0)
MCHC: 30.6 g/dL (ref 30.0–36.0)
MCV: 98.2 fL (ref 80.0–100.0)
Platelets: 323 10*3/uL (ref 150–400)
RBC: 1.63 MIL/uL — ABNORMAL LOW (ref 3.87–5.11)
RDW: 14.8 % (ref 11.5–15.5)
WBC: 17.8 10*3/uL — ABNORMAL HIGH (ref 4.0–10.5)
nRBC: 0.3 % — ABNORMAL HIGH (ref 0.0–0.2)

## 2018-11-12 LAB — SURGICAL PATHOLOGY

## 2018-11-12 LAB — PREPARE RBC (CROSSMATCH)

## 2018-11-12 MED ORDER — NITROGLYCERIN 0.4 MG SL SUBL
0.4000 mg | SUBLINGUAL_TABLET | SUBLINGUAL | Status: DC | PRN
  Administered 2018-11-17: 0.4 mg via SUBLINGUAL
  Filled 2018-11-12: qty 1

## 2018-11-12 MED ORDER — TRAMADOL HCL 50 MG PO TABS
50.0000 mg | ORAL_TABLET | Freq: Two times a day (BID) | ORAL | Status: DC
  Administered 2018-11-12 – 2018-11-18 (×12): 50 mg via ORAL
  Filled 2018-11-12 (×12): qty 1

## 2018-11-12 MED ORDER — METHOCARBAMOL 500 MG PO TABS
500.0000 mg | ORAL_TABLET | Freq: Three times a day (TID) | ORAL | Status: DC
  Administered 2018-11-12 (×3): 500 mg via ORAL
  Filled 2018-11-12 (×3): qty 1

## 2018-11-12 MED ORDER — ANASTROZOLE 1 MG PO TABS
1.0000 mg | ORAL_TABLET | Freq: Every day | ORAL | Status: DC
  Administered 2018-11-12 – 2018-11-18 (×7): 1 mg via ORAL
  Filled 2018-11-12 (×7): qty 1

## 2018-11-12 MED ORDER — PANTOPRAZOLE SODIUM 40 MG PO TBEC
40.0000 mg | DELAYED_RELEASE_TABLET | Freq: Every day | ORAL | Status: DC
  Administered 2018-11-12 – 2018-11-18 (×7): 40 mg via ORAL
  Filled 2018-11-12 (×7): qty 1

## 2018-11-12 MED ORDER — ALPRAZOLAM 0.25 MG PO TABS
0.2500 mg | ORAL_TABLET | Freq: Every day | ORAL | Status: DC
  Administered 2018-11-12 – 2018-11-17 (×6): 0.25 mg via ORAL
  Filled 2018-11-12 (×6): qty 1

## 2018-11-12 MED ORDER — SODIUM CHLORIDE 0.9% IV SOLUTION
Freq: Once | INTRAVENOUS | Status: AC
  Administered 2018-11-12: 06:00:00 via INTRAVENOUS

## 2018-11-12 NOTE — TOC Initial Note (Signed)
Transition of Care [facility]) - Initial/Assessment Note    Patient Details  Name: [name] MRN: [id number] Date of Birth: 17-Mar-1950  Transition of [facility]) CM/SW Contact:    Alexander Mt, [place] Phone Number: 11/12/2018, 4:16 PM  Clinical Narrative:                 CSW met with pt at bedside. Introduced self, role, reason for visit. Pt in obvious discomfort but was amenable to engage with CSW in brief assessment. Pt from home with various family members was only specific about her grandson Therapist, occupational. Her sister and other listed contact are out of town, changed order for [facility] to be first contact at her request. Pt usually uses a cane or walker to get around. She does not have supplemental oxygen at home- states "I have some inhalers and things." CSW let pt know that we are available. She denies any transportation needs or trouble obtaining medications at this time.   CSW/CM will continue to follow.   Expected Discharge Plan: Home/Self Care Barriers to Discharge: Continued Medical Work up   Patient Goals and CMS Choice Patient states their goals for this hospitalization and ongoing recovery are:: to have less pain in her abdomen  Expected Discharge Plan and Services Expected Discharge Plan: Home/Self Care In-house Referral: Clinical Social Work Discharge Planning Services: CM Consult, Follow-up appt scheduled Living arrangements for the past 2 months: Single Family Home  Prior Living Arrangements/Services Living arrangements for the past 2 months: Single Family Home Lives with:: Relatives Patient language and need for interpreter reviewed:: Yes Do you feel safe going back to the place where you live?: Yes      Need for Family Participation in Patient Care: Yes (Comment)(assistance as needed) Care giver support system in place?: Yes (comment)(family members) Current home services: DME Criminal Activity/Legal Involvement Pertinent to Current Situation/Hospitalization: No -  Comment as needed  Activities of Daily Living      Permission Sought/Granted Permission sought to share information with : Family Supports, PCP Permission granted to share information with : Yes, Verbal Permission Granted  Share Information with NAME: Shalene Gallen     Permission granted to share info w Relationship: grandson  Permission granted to share info w Contact Information: [phone]  Emotional Assessment Appearance:: Appears stated age Attitude/Demeanor/Rapport: Engaged Affect (typically observed): Overwhelmed, Restless Orientation: : Oriented to Self, Oriented to Place, Oriented to  Time Alcohol / Substance Use: Not Applicable Psych Involvement: No (comment)  Admission diagnosis:  CHRONIC CHOLECYSTITIS WITH GALL STONES Patient Active Problem List   Diagnosis Date Noted  . Chronic cholecystitis 11/11/2018  . Hemobilia   . Dilated bile duct   . Jaundice   . Elevated LFTs   . Choledocholithiasis 10/02/2018  . CKD (chronic kidney disease), stage III   . Acute cholecystitis   . Elevated liver enzymes 09/18/2018  . Calculus of gallbladder and bile duct with acute cholecystitis, with obstruction   . Melena   . Unstable angina ([place]) 07/12/2018  . Port-A-Cath in place 02/27/2018  . Cancer of overlapping sites of right breast ([place]) 11/27/2017  . Malignant neoplasm of upper-outer quadrant of right breast in female, estrogen receptor positive ([place]) 10/03/2017  . Carpal tunnel syndrome, bilateral 11/26/2016  . 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 Abdominal pain, epigastric   . Acute on chronic renal failure ([place]) 02/19/2015  . Diarrhea   . Nausea with vomiting   . Hyperglycemia 04/01/2014  . Hyperosmolar non-ketotic state in patient  with type 2 diabetes mellitus ([place]) 04/01/2014  . Nausea vomiting and diarrhea 04/01/2014  . CAD (coronary artery disease) 04/01/2014  . Dehydration   . Diabetic ketoacidosis without coma associated with diabetes mellitus due  to underlying condition ([place])   . High anion gap metabolic acidosis   . Cocaine abuse ([place])   . Toxic encephalopathy-Unlikely metabolic,?psychogenic 08/02/2012  . Bereavement 02/02/2012  . Depression 02/02/2012  . Iron deficiency anemia, unspecified 11/30/2011  . Nonspecific abnormal finding in stool contents 11/30/2011  . Benign neoplasm of colon 11/30/2011  . Cough 05/14/2011  . Chest pain 03/19/2011  . Macrocytic anemia 03/19/2011  . Insulin dependent diabetes mellitus 03/19/2011  . DOE (dyspnea on exertion) 03/19/2011  . Chest pain at rest 02/18/2011    Class: Acute  . Cholelithiasis 11/01/2010  . DM 04/08/2009  . HYPERLIPIDEMIA 04/08/2009  . Essential hypertension 04/08/2009  . MYOCARDIAL INFARCTION 04/08/2009  . Coronary atherosclerosis 04/08/2009  . GERD 04/08/2009  . FATTY LIVER DISEASE 04/08/2009  . COLONIC POLYPS, HX OF 04/08/2009   PCP:  Tsosie Billing, MD (Inactive) Pharmacy:   [facility] [place], [place] [phone] [place] [address] Phone: [phone] Fax: [place], [place] - [place] [address] Phone: [phone] Fax: [phone]     Social Determinants of Health (SDOH) Interventions    Readmission Risk Interventions Readmission Risk Prevention Plan 11/12/2018  Transportation Screening Complete  Medication Review ([place]) Complete  PCP or Specialist appointment within 3-5 days of discharge Complete  [place] or [place] Complete  SW Recovery Care/Counseling Consult Complete  [place] Not Applicable  Some recent data might be hidden

## 2018-11-12 NOTE — Progress Notes (Signed)
Md notified of pt inability to void earlier in the shift. Orders for I&O x 1. Completed and 750cc of urine returned. Will continue to monitor outputs. HGB is 4.9 this am. MD(tsuei) notified and orders for 2u PRBC's x 1. Pt is okay with blood transfusion and has received them in the past. Will give and continue to monitor.

## 2018-11-12 NOTE — Progress Notes (Addendum)
[facility]/Trauma Progress Note  1 Day Post-Op   Assessment/Plan History of MI s/p cath 2013 with stents placed HTN -Home meds GERD -Protonix Diabetes -SSI CAD -holding aspirin COPD  History of cocaine abuse History of right breast cancer -mastectomy 2019 -home med History of right colectomy 2009  Chronic cholecystitis -POD #1: S/p laparoscopic converted to open cholecystectomy with IOC, Dr. Ninfa Linden, 10/06 - Pain control, ambulate, IS - LFTs and alk phos trending down, a.m. labs ABLA - hemoglobin 7.5 yesterday, 4.9 this morning we will transfuse 2 units, a.m. labs Hyperkalemia -5.4 today, recheck this afternoon Acute on chronic kidney disease -1.98 creatinine, IVF, monitor  FEN: CLD VTE: SCD's, lovenox on hold 2/2 above ID: Ancef preop only, WBC 17.8, afebrile Foley: I&O cath x1 last night Follow up: Dr. Ninfa Linden    LOS: 1 day    Subjective: CC: Right upper quadrant abdominal pain  Patient is also complaining of nausea this morning.  She is very uncomfortable.  She had pain medicine this morning.  She denies emesis.  She denies flatus.  Objective: Vital signs in last 24 hours: Temp:  [97 F (36.1 C)-99.1 F (37.3 C)] 99.1 F (37.3 C) (10/07 0631) Pulse Rate:  [65-90] 82 (10/07 0631) Resp:  [9-24] 24 (10/07 0631) BP: (82-132)/(53-87) 128/62 (10/07 0631) SpO2:  [92 %-100 %] 98 % (10/07 0631)    Intake/Output from previous day: 10/06 0701 - 10/07 0700 In: 3117.4 [I.V.:2867.4; IV Piggyback:250] Out: 1085 [Urine:750; Drains:85; Blood:250] Intake/Output this shift: No intake/output data recorded.  PE: Gen:  Alert, NAD, pleasant, cooperative Card:  RRR, no M/G/R heard Pulm:  CTA, no W/R/R, rate and effort normal, breaths are shallow Abd: Soft, mild distention, +BS, RUQ incision with honeycomb in place, umbilical incision with staples in place is without drainage or signs of infection.  JP drain on right is serosanguineous.  Patient is TTP to right  side with guarding.  No peritonitis Skin: no rashes noted, warm and dry   Anti-infectives: Anti-infectives (From admission, onward)   Start     Dose/Rate Route Frequency Ordered Stop   11/11/18 0748  ceFAZolin (ANCEF) 2-4 GM/100ML-% IVPB    Note to Pharmacy: Marga Melnick   : cabinet override      11/11/18 0748 11/11/18 0840   11/11/18 0745  ceFAZolin (ANCEF) IVPB 2g/100 mL premix     2 g 200 mL/hr over 30 Minutes Intravenous On call to O.R. 11/11/18 0743 11/11/18 0840      Lab Results:  Recent Labs    11/12/18 0214  WBC 17.8*  HGB 4.9*  HCT 16.0*  PLT 323   BMET Recent Labs    11/12/18 0214  NA 135  K 5.4*  CL 107  CO2 19*  GLUCOSE 162*  BUN 26*  CREATININE 1.98*  CALCIUM 8.6*   PT/INR No results for input(s): LABPROT, INR in the last 72 hours. CMP     Component Value Date/Time   NA 135 11/12/2018 0214   K 5.4 (H) 11/12/2018 0214   CL 107 11/12/2018 0214   CO2 19 (L) 11/12/2018 0214   GLUCOSE 162 (H) 11/12/2018 0214   BUN 26 (H) 11/12/2018 0214   CREATININE 1.98 (H) 11/12/2018 0214   CREATININE 1.88 (H) 11/06/2018 1337   CALCIUM 8.6 (L) 11/12/2018 0214   PROT 6.7 11/12/2018 0214   ALBUMIN 3.1 (L) 11/12/2018 0214   AST 67 (H) 11/12/2018 0214   AST 103 (H) 11/06/2018 1337   ALT 60 (H) 11/12/2018 0214  ALT 146 (H) 11/06/2018 1337   ALKPHOS 163 (H) 11/12/2018 0214   BILITOT 0.4 11/12/2018 0214   BILITOT 0.5 11/06/2018 1337   GFRNONAA 25 (L) 11/12/2018 0214   GFRNONAA 27 (L) 11/06/2018 1337   GFRAA 29 (L) 11/12/2018 0214   GFRAA 31 (L) 11/06/2018 1337   Lipase     Component Value Date/Time   LIPASE 82 (H) 10/02/2018 1020    Studies/Results: Dg Cholangiogram Operative  Result Date: 11/11/2018 CLINICAL DATA:  Intraoperative cholangiogram EXAM: INTRAOPERATIVE CHOLANGIOGRAM TECHNIQUE: Cholangiographic images from the C-arm fluoroscopic device were submitted for interpretation post-operatively. Please see the procedural report for the amount of  contrast and the fluoroscopy time utilized. COMPARISON:  October 03, 2018 FINDINGS: There is cannulation of the cystic duct. The cystic duct appears patent. There may be small filling defects in the proximal portion of the common bile duct just distal to the distal most aspect of the patent duct. There is a long segment narrowing of the distal common bile duct of unknown clinical significance. There is a questionable intrahepatic area of stenosis involving the bile duct draining hepatic segment 4 and potentially 8. This is not well evaluated secondary to a lack of additional views. IMPRESSION: Status post intraoperative cholangiogram with findings concerning for choledocholithiasis as detailed above. There is a narrowing of the distal common bile duct of unknown clinical significance. Electronically Signed   By: Constance Holster M.D.   On: 11/11/2018 12:31     Kalman Drape, [facility] Surgery Pager [phone] Cristine Polio, & Friday 7:00am - 4:30pm Thursdays 7:00am -11:30am

## 2018-11-12 NOTE — Plan of Care (Signed)
  Problem: Clinical Measurements: Goal: Postoperative complications will be avoided or minimized Outcome: Progressing   Problem: Skin Integrity: Goal: Demonstration of wound healing without infection will improve Outcome: Progressing   

## 2018-11-13 LAB — BPAM RBC
Blood Product Expiration Date: 202010092359
Blood Product Expiration Date: 202010152359
ISSUE DATE / TIME: 202010070607
ISSUE DATE / TIME: 202010070948
Unit Type and Rh: 8400
Unit Type and Rh: 8400

## 2018-11-13 LAB — GLUCOSE, CAPILLARY
Glucose-Capillary: 104 mg/dL — ABNORMAL HIGH (ref 70–99)
Glucose-Capillary: 220 mg/dL — ABNORMAL HIGH (ref 70–99)
Glucose-Capillary: 150 mg/dL — ABNORMAL HIGH (ref 70–99)
Glucose-Capillary: 104 mg/dL — ABNORMAL HIGH (ref 70–99)

## 2018-11-13 LAB — TYPE AND SCREEN
ABO/RH(D): AB POS
Antibody Screen: NEGATIVE
Unit division: 0
Unit division: 0

## 2018-11-13 LAB — CBC
HCT: 26 % — ABNORMAL LOW (ref 36.0–46.0)
Hemoglobin: 8.4 g/dL — ABNORMAL LOW (ref 12.0–15.0)
MCH: 28.2 pg (ref 26.0–34.0)
MCHC: 32.3 g/dL (ref 30.0–36.0)
MCV: 87.2 fL (ref 80.0–100.0)
Platelets: 284 10*3/uL (ref 150–400)
RBC: 2.98 MIL/uL — ABNORMAL LOW (ref 3.87–5.11)
RDW: 21.4 % — ABNORMAL HIGH (ref 11.5–15.5)
WBC: 14.9 10*3/uL — ABNORMAL HIGH (ref 4.0–10.5)
nRBC: 1.2 % — ABNORMAL HIGH (ref 0.0–0.2)

## 2018-11-13 LAB — COMPREHENSIVE METABOLIC PANEL
ALT: 42 U/L (ref 0–44)
AST: 46 U/L — ABNORMAL HIGH (ref 15–41)
Albumin: 3 g/dL — ABNORMAL LOW (ref 3.5–5.0)
Alkaline Phosphatase: 145 U/L — ABNORMAL HIGH (ref 38–126)
Anion gap: 8 (ref 5–15)
BUN: 28 mg/dL — ABNORMAL HIGH (ref 8–23)
CO2: 20 mmol/L — ABNORMAL LOW (ref 22–32)
Calcium: 8.6 mg/dL — ABNORMAL LOW (ref 8.9–10.3)
Chloride: 109 mmol/L (ref 98–111)
Creatinine, Ser: 1.91 mg/dL — ABNORMAL HIGH (ref 0.44–1.00)
GFR calc Af Amer: 30 mL/min — ABNORMAL LOW (ref >60)
GFR calc non Af Amer: 26 mL/min — ABNORMAL LOW (ref >60)
Glucose, Bld: 97 mg/dL (ref 70–99)
Potassium: 5.1 mmol/L (ref 3.5–5.1)
Sodium: 137 mmol/L (ref 135–145)
Total Bilirubin: 1.7 mg/dL — ABNORMAL HIGH (ref 0.3–1.2)
Total Protein: 6.9 g/dL (ref 6.5–8.1)

## 2018-11-13 MED ORDER — METHOCARBAMOL 750 MG PO TABS
750.0000 mg | ORAL_TABLET | Freq: Three times a day (TID) | ORAL | Status: DC
  Administered 2018-11-13 – 2018-11-18 (×15): 750 mg via ORAL
  Filled 2018-11-13 (×16): qty 1

## 2018-11-13 MED ORDER — ENOXAPARIN SODIUM 30 MG/0.3ML ~~LOC~~ SOLN
30.0000 mg | SUBCUTANEOUS | Status: DC
  Administered 2018-11-13 – 2018-11-17 (×5): 30 mg via SUBCUTANEOUS
  Filled 2018-11-13 (×5): qty 0.3

## 2018-11-13 MED ORDER — ACETAMINOPHEN 325 MG PO TABS
650.0000 mg | ORAL_TABLET | Freq: Four times a day (QID) | ORAL | Status: DC
  Administered 2018-11-13 – 2018-11-18 (×15): 650 mg via ORAL
  Filled 2018-11-13 (×16): qty 2

## 2018-11-13 NOTE — Plan of Care (Signed)
  Problem: Education: ?Goal: Knowledge of General Education information will improve ?Description: Including pain rating scale, medication(s)/side effects and non-pharmacologic comfort measures ?Outcome: Progressing ?  ?Problem: Health Behavior/Discharge Planning: ?Goal: Ability to manage health-related needs will improve ?Outcome: Progressing ?  ?Problem: Clinical Measurements: ?Goal: Respiratory complications will improve ?Outcome: Progressing ?Goal: Cardiovascular complication will be avoided ?Outcome: Progressing ?  ?Problem: Elimination: ?Goal: Will not experience complications related to urinary retention ?Outcome: Progressing ?  ?Problem: Pain Managment: ?Goal: General experience of comfort will improve ?Outcome: Progressing ?  ?Problem: Safety: ?Goal: Ability to remain free from injury will improve ?Outcome: Progressing ?  ?Problem: Skin Integrity: ?Goal: Risk for impaired skin integrity will decrease ?Outcome: Progressing ?  ?

## 2018-11-13 NOTE — Plan of Care (Signed)
  Problem: Clinical Measurements: Goal: Ability to maintain clinical measurements within normal limits will improve Outcome: Progressing Goal: Postoperative complications will be avoided or minimized Outcome: Progressing   Problem: Clinical Measurements: Goal: Postoperative complications will be avoided or minimized Outcome: Progressing   Problem: Skin Integrity: Goal: Demonstration of wound healing without infection will improve Outcome: Progressing

## 2018-11-13 NOTE — Progress Notes (Signed)
[facility]/Trauma Progress Note  2 Days Post-Op   Assessment/Plan History of MI s/p cath 2013 with stents placed HTN -Home meds GERD -Protonix Diabetes -SSI CAD -holding aspirin COPD  History of cocaine abuse History of right breast cancer -mastectomy 2019 -home med History of right colectomy 2009  Chronic cholecystitis -POD #2: S/p laparoscopic converted to open cholecystectomy with IOC, Dr. Ninfa Linden, 10/06 - Pain control, ambulate, IS - LFTs and alk phos trending down, Tbili up to 1.7 this am. a.m. labs ABLA - hemoglobin 7.5 on admission, 4.9 10/07 and transfused 2 units, today Hgb is 8.4. will restart lovenox. a.m. labs Hyperkalemia -improved Acute on chronic kidney disease -1.91 creatinine, IVF, monitor  FEN: CLD until flatus VTE: SCD's, lovenox ID: Ancef preop only, WBC down to 14.9, afebrile Foley: I&O cath x1 10/06 Follow up: Dr. Ninfa Linden   LOS: 2 days    Subjective: CC: abdominal pain  Pt denies nausea or vomiting. She states no flatus or BM. She is tolerating clears. Her pain is better than yesterday. Encouraged pt to get out of bed today.  Objective: Vital signs in last 24 hours: Temp:  [98.2 F (36.8 C)-99.1 F (37.3 C)] 98.6 F (37 C) (10/08 0445) Pulse Rate:  [73-93] 73 (10/08 0445) Resp:  [15-20] 20 (10/08 0445) BP: (87-99)/(64-74) 91/64 (10/08 0445) SpO2:  [95 %-100 %] 99 % (10/08 0445)    Intake/Output from previous day: 10/07 0701 - 10/08 0700 In: 1097.5 [I.V.:417.5; Blood:680] Out: 40 [Urine:900; Drains:70] Intake/Output this shift: Total I/O In: -  Out: 450 [Urine:450]  PE: Gen:  Alert, NAD, pleasant, cooperative Card:  RRR, no M/G/R heard Pulm:  CTA, no W/R/R, rate and effort normal, breaths are shallow Abd: Soft, distention, +BS, RUQ incision with honeycomb in place, umbilical incision with staples in place.  JP drain on right is serosanguineous.  Patient is TTP to right side with guarding.  No peritonitis Skin: no  rashes noted, warm and dry   Anti-infectives: Anti-infectives (From admission, onward)   Start     Dose/Rate Route Frequency Ordered Stop   11/11/18 0748  ceFAZolin (ANCEF) 2-4 GM/100ML-% IVPB    Note to Pharmacy: Marga Melnick   : cabinet override      11/11/18 0748 11/11/18 0840   11/11/18 0745  ceFAZolin (ANCEF) IVPB 2g/100 mL premix     2 g 200 mL/hr over 30 Minutes Intravenous On call to O.R. 11/11/18 0743 11/11/18 0840      Lab Results:  Recent Labs    11/12/18 0214 11/13/18 0158  WBC 17.8* 14.9*  HGB 4.9* 8.4*  HCT 16.0* 26.0*  PLT 323 284   BMET Recent Labs    11/12/18 0214 11/13/18 0158  NA 135 137  K 5.4* 5.1  CL 107 109  CO2 19* 20*  GLUCOSE 162* 97  BUN 26* 28*  CREATININE 1.98* 1.91*  CALCIUM 8.6* 8.6*   PT/INR No results for input(s): LABPROT, INR in the last 72 hours. CMP     Component Value Date/Time   NA 137 11/13/2018 0158   K 5.1 11/13/2018 0158   CL 109 11/13/2018 0158   CO2 20 (L) 11/13/2018 0158   GLUCOSE 97 11/13/2018 0158   BUN 28 (H) 11/13/2018 0158   CREATININE 1.91 (H) 11/13/2018 0158   CREATININE 1.88 (H) 11/06/2018 1337   CALCIUM 8.6 (L) 11/13/2018 0158   PROT 6.9 11/13/2018 0158   ALBUMIN 3.0 (L) 11/13/2018 0158   AST 46 (H) 11/13/2018 0158   AST  103 (H) 11/06/2018 1337   ALT 42 11/13/2018 0158   ALT 146 (H) 11/06/2018 1337   ALKPHOS 145 (H) 11/13/2018 0158   BILITOT 1.7 (H) 11/13/2018 0158   BILITOT 0.5 11/06/2018 1337   GFRNONAA 26 (L) 11/13/2018 0158   GFRNONAA 27 (L) 11/06/2018 1337   GFRAA 30 (L) 11/13/2018 0158   GFRAA 31 (L) 11/06/2018 1337   Lipase     Component Value Date/Time   LIPASE 82 (H) 10/02/2018 1020    Studies/Results: Dg Cholangiogram Operative  Result Date: 11/11/2018 CLINICAL DATA:  Intraoperative cholangiogram EXAM: INTRAOPERATIVE CHOLANGIOGRAM TECHNIQUE: Cholangiographic images from the C-arm fluoroscopic device were submitted for interpretation post-operatively. Please see the procedural  report for the amount of contrast and the fluoroscopy time utilized. COMPARISON:  October 03, 2018 FINDINGS: There is cannulation of the cystic duct. The cystic duct appears patent. There may be small filling defects in the proximal portion of the common bile duct just distal to the distal most aspect of the patent duct. There is a long segment narrowing of the distal common bile duct of unknown clinical significance. There is a questionable intrahepatic area of stenosis involving the bile duct draining hepatic segment 4 and potentially 8. This is not well evaluated secondary to a lack of additional views. IMPRESSION: Status post intraoperative cholangiogram with findings concerning for choledocholithiasis as detailed above. There is a narrowing of the distal common bile duct of unknown clinical significance. Electronically Signed   By: Constance Holster M.D.   On: 11/11/2018 12:31     Kalman Drape, [facility] Surgery Pager [phone] Cristine Polio, & Friday 7:00am - 4:30pm Thursdays 7:00am -11:30am

## 2018-11-14 LAB — COMPREHENSIVE METABOLIC PANEL
ALT: 30 U/L (ref 0–44)
AST: 27 U/L (ref 15–41)
Albumin: 2.7 g/dL — ABNORMAL LOW (ref 3.5–5.0)
Alkaline Phosphatase: 130 U/L — ABNORMAL HIGH (ref 38–126)
Anion gap: 7 (ref 5–15)
BUN: 22 mg/dL (ref 8–23)
CO2: 22 mmol/L (ref 22–32)
Calcium: 8.6 mg/dL — ABNORMAL LOW (ref 8.9–10.3)
Chloride: 108 mmol/L (ref 98–111)
Creatinine, Ser: 1.74 mg/dL — ABNORMAL HIGH (ref 0.44–1.00)
GFR calc Af Amer: 34 mL/min — ABNORMAL LOW (ref >60)
GFR calc non Af Amer: 29 mL/min — ABNORMAL LOW (ref >60)
Glucose, Bld: 109 mg/dL — ABNORMAL HIGH (ref 70–99)
Potassium: 4.3 mmol/L (ref 3.5–5.1)
Sodium: 137 mmol/L (ref 135–145)
Total Bilirubin: 0.9 mg/dL (ref 0.3–1.2)
Total Protein: 6.6 g/dL (ref 6.5–8.1)

## 2018-11-14 LAB — CBC
HCT: 26.6 % — ABNORMAL LOW (ref 36.0–46.0)
Hemoglobin: 8.5 g/dL — ABNORMAL LOW (ref 12.0–15.0)
MCH: 28.2 pg (ref 26.0–34.0)
MCHC: 32 g/dL (ref 30.0–36.0)
MCV: 88.4 fL (ref 80.0–100.0)
Platelets: 315 10*3/uL (ref 150–400)
RBC: 3.01 MIL/uL — ABNORMAL LOW (ref 3.87–5.11)
RDW: 19.7 % — ABNORMAL HIGH (ref 11.5–15.5)
WBC: 9.8 10*3/uL (ref 4.0–10.5)
nRBC: 0.6 % — ABNORMAL HIGH (ref 0.0–0.2)

## 2018-11-14 LAB — GLUCOSE, CAPILLARY
Glucose-Capillary: 100 mg/dL — ABNORMAL HIGH (ref 70–99)
Glucose-Capillary: 160 mg/dL — ABNORMAL HIGH (ref 70–99)
Glucose-Capillary: 65 mg/dL — ABNORMAL LOW (ref 70–99)
Glucose-Capillary: 85 mg/dL (ref 70–99)
Glucose-Capillary: 142 mg/dL — ABNORMAL HIGH (ref 70–99)

## 2018-11-14 MED ORDER — POLYETHYLENE GLYCOL 3350 17 G PO PACK
17.0000 g | PACK | Freq: Every day | ORAL | Status: DC
  Administered 2018-11-14 – 2018-11-18 (×5): 17 g via ORAL
  Filled 2018-11-14 (×5): qty 1

## 2018-11-14 MED ORDER — SODIUM CHLORIDE 0.9% FLUSH
10.0000 mL | INTRAVENOUS | Status: DC | PRN
  Administered 2018-11-18: 10 mL
  Filled 2018-11-14: qty 40

## 2018-11-14 MED ORDER — DOCUSATE SODIUM 100 MG PO CAPS
100.0000 mg | ORAL_CAPSULE | Freq: Every day | ORAL | Status: DC
  Administered 2018-11-14 – 2018-11-18 (×5): 100 mg via ORAL
  Filled 2018-11-14 (×5): qty 1

## 2018-11-14 MED ORDER — CHLORHEXIDINE GLUCONATE CLOTH 2 % EX PADS
6.0000 | MEDICATED_PAD | Freq: Every day | CUTANEOUS | Status: DC
  Administered 2018-11-15 – 2018-11-18 (×3): 6 via TOPICAL

## 2018-11-14 MED ORDER — SODIUM CHLORIDE 0.9% FLUSH
10.0000 mL | Freq: Two times a day (BID) | INTRAVENOUS | Status: DC
  Administered 2018-11-14 – 2018-11-18 (×2): 10 mL

## 2018-11-14 NOTE — Progress Notes (Signed)
Pt states she has not had any more twitching since the Neurontin was stopped.  Gave stool softener and laxative. PAC accessed since peripheral IV was leaking.

## 2018-11-14 NOTE — Care Management Important Message (Signed)
Important Message  Patient Details  Name: [name] MRN: [id number] Date of Birth: [birth year]   Medicare Important Message Given:  Yes     [name] 11/14/2018, 1:49 PM

## 2018-11-14 NOTE — Progress Notes (Signed)
Pt had a very hard ball of stool, order obtained for stool softener/lax.

## 2018-11-14 NOTE — Progress Notes (Signed)
Pt tolerating full liquid diet, will advance to carb mod heart healthy for dinner.

## 2018-11-14 NOTE — Progress Notes (Signed)
[facility]/Trauma Progress Note  3 Days Post-Op   Assessment/Plan History of MI s/p cath 2013 with stents placed HTN-Home meds GERD-Protonix Diabetes-SSI CAD-holding aspirin COPD  History of cocaine abuse History of right breast cancer-mastectomy2019 -home med History of right colectomy 2009  Chronic cholecystitis -POD #3: S/p laparoscopic converted to open cholecystectomy with IOC, Dr. Ninfa Linden, 10/06 -Pain control, ambulate, IS -LFTs and Tbili WNL, alk phos trending down, a.m. labs ABLA- hemoglobin 7.5 on admission, 4.9 10/07 and transfused 2 units,today Hgb is 8.5. stable Hyperkalemia-improved Acute on chronic kidney disease- creatinine improved,IVF, monitor  FEN:FLD and advance as tolerated VTE: SCD's, lovenox CX:KGYJE preop only,WBC 9.8, afebrile Foley:I&O cath x1 10/06 Follow up:Dr. Ninfa Linden  Dispo: PT pending, likely home this weekend if she continues to improve.    LOS: 3 days    Subjective: CC: abdominal pain  Pain improved from yesterday. Pt got up into chair yesterday. She is having flatus. She denies nausea, vomiting, fever or chills overnight. No BM yet.  Objective: Vital signs in last 24 hours: Temp:  [98.5 F (36.9 C)-98.9 F (37.2 C)] 98.9 F (37.2 C) (10/09 0506) Pulse Rate:  [68-75] 74 (10/09 0506) Resp:  [15-20] 15 (10/09 0506) BP: (93-106)/(62-73) 106/73 (10/09 0506) SpO2:  [97 %-100 %] 97 % (10/09 0506)    Intake/Output from previous day: 10/08 0701 - 10/09 0700 In: 1543.4 [P.O.:960; I.V.:583.4] Out: 1925 [Urine:1900; Drains:25] Intake/Output this shift: No intake/output data recorded.  PE: Gen: Alert, NAD, pleasant, cooperative Card: RRR, no M/G/R heard Pulm: CTA, no W/R/R, rate andeffort normal, working on IS Abd: Soft,distention,+BS,RUQ incision with honeycomb in place, umbilical incision with staples in place. JP drain on right is serosanguineous. Patient is TTP to right side with  guarding. No peritonitis Skin: no rashes noted, warm and dry  Anti-infectives: Anti-infectives (From admission, onward)   Start     Dose/Rate Route Frequency Ordered Stop   11/11/18 0748  ceFAZolin (ANCEF) 2-4 GM/100ML-% IVPB    Note to Pharmacy: Marga Melnick   : cabinet override      11/11/18 0748 11/11/18 0840   11/11/18 0745  ceFAZolin (ANCEF) IVPB 2g/100 mL premix     2 g 200 mL/hr over 30 Minutes Intravenous On call to O.R. 11/11/18 0743 11/11/18 0840      Lab Results:  Recent Labs    11/13/18 0158 11/14/18 0817  WBC 14.9* 9.8  HGB 8.4* 8.5*  HCT 26.0* 26.6*  PLT 284 315   BMET Recent Labs    11/13/18 0158 11/14/18 0817  NA 137 137  K 5.1 4.3  CL 109 108  CO2 20* 22  GLUCOSE 97 109*  BUN 28* 22  CREATININE 1.91* 1.74*  CALCIUM 8.6* 8.6*   PT/INR No results for input(s): LABPROT, INR in the last 72 hours. CMP     Component Value Date/Time   NA 137 11/14/2018 0817   K 4.3 11/14/2018 0817   CL 108 11/14/2018 0817   CO2 22 11/14/2018 0817   GLUCOSE 109 (H) 11/14/2018 0817   BUN 22 11/14/2018 0817   CREATININE 1.74 (H) 11/14/2018 0817   CREATININE 1.88 (H) 11/06/2018 1337   CALCIUM 8.6 (L) 11/14/2018 0817   PROT 6.6 11/14/2018 0817   ALBUMIN 2.7 (L) 11/14/2018 0817   AST 27 11/14/2018 0817   AST 103 (H) 11/06/2018 1337   ALT 30 11/14/2018 0817   ALT 146 (H) 11/06/2018 1337   ALKPHOS 130 (H) 11/14/2018 0817   BILITOT 0.9 11/14/2018 0817  BILITOT 0.5 11/06/2018 1337   GFRNONAA 29 (L) 11/14/2018 0817   GFRNONAA 27 (L) 11/06/2018 1337   GFRAA 34 (L) 11/14/2018 0817   GFRAA 31 (L) 11/06/2018 1337   Lipase     Component Value Date/Time   LIPASE 82 (H) 10/02/2018 1020    Studies/Results: No results found.   Kalman Drape, [facility] Surgery Pager [phone] Cristine Polio, & Friday 7:00am - 4:30pm Thursdays 7:00am -11:30am

## 2018-11-14 NOTE — Progress Notes (Signed)
Nurse paged with concerns for pt hand and feet "twitching" new since hospital admission per nurse. Stopped gabapentin. Do not see any other meds that might be causing this. Monitor. VSS  Jackson Latino, [facility] Surgery Pager [phone]

## 2018-11-15 LAB — CBC
HCT: 25.9 % — ABNORMAL LOW (ref 36.0–46.0)
Hemoglobin: 7.8 g/dL — ABNORMAL LOW (ref 12.0–15.0)
MCH: 27.4 pg (ref 26.0–34.0)
MCHC: 30.1 g/dL (ref 30.0–36.0)
MCV: 90.9 fL (ref 80.0–100.0)
Platelets: 340 10*3/uL (ref 150–400)
RBC: 2.85 MIL/uL — ABNORMAL LOW (ref 3.87–5.11)
RDW: 18.5 % — ABNORMAL HIGH (ref 11.5–15.5)
WBC: 9 10*3/uL (ref 4.0–10.5)
nRBC: 0.7 % — ABNORMAL HIGH (ref 0.0–0.2)

## 2018-11-15 LAB — GLUCOSE, CAPILLARY
Glucose-Capillary: 140 mg/dL — ABNORMAL HIGH (ref 70–99)
Glucose-Capillary: 93 mg/dL (ref 70–99)
Glucose-Capillary: 124 mg/dL — ABNORMAL HIGH (ref 70–99)
Glucose-Capillary: 140 mg/dL — ABNORMAL HIGH (ref 70–99)

## 2018-11-15 LAB — COMPREHENSIVE METABOLIC PANEL
ALT: 43 U/L (ref 0–44)
AST: 41 U/L (ref 15–41)
Albumin: 2.4 g/dL — ABNORMAL LOW (ref 3.5–5.0)
Alkaline Phosphatase: 147 U/L — ABNORMAL HIGH (ref 38–126)
Anion gap: 8 (ref 5–15)
BUN: 16 mg/dL (ref 8–23)
CO2: 25 mmol/L (ref 22–32)
Calcium: 8.7 mg/dL — ABNORMAL LOW (ref 8.9–10.3)
Chloride: 106 mmol/L (ref 98–111)
Creatinine, Ser: 1.57 mg/dL — ABNORMAL HIGH (ref 0.44–1.00)
GFR calc Af Amer: 38 mL/min — ABNORMAL LOW (ref >60)
GFR calc non Af Amer: 33 mL/min — ABNORMAL LOW (ref >60)
Glucose, Bld: 110 mg/dL — ABNORMAL HIGH (ref 70–99)
Potassium: 4.2 mmol/L (ref 3.5–5.1)
Sodium: 139 mmol/L (ref 135–145)
Total Bilirubin: 1 mg/dL (ref 0.3–1.2)
Total Protein: 6.2 g/dL — ABNORMAL LOW (ref 6.5–8.1)

## 2018-11-15 NOTE — Evaluation (Signed)
Physical Therapy Evaluation Patient Details Name: [name] MRN: [id number] DOB: 07-Nov-1950 Today's Date: 11/15/2018   History of Present Illness  Pt is a 68 y/o female admitted secondary to gall stones and chronic cholecystitis. Pt is s/p LAPAROSCOPIC CONVERTED TO OPEN CHOLECYSTECTOMY. PMH including but not limited to DM, HTN and breast cancer.    Clinical Impression  Pt presented supine in bed with HOB elevated, awake and willing to participate in therapy session. Prior to admission, pt reported that she was at a modified independent level with use of a rollator to ambulate. At the time of evaluation, pt slightly limited secondary to abdominal pain. However, she was able to perform all functional mobility at a mod I to supervision level with use of RW to ambulate in hallway. Pt with no instability or LOB throughout evaluation. Pt reported that she feels she is at her baseline in regards to functional mobility. No further acute PT needs identified at this time. PT signing off.     Follow Up Recommendations No PT follow up    Equipment Recommendations  None recommended by PT    Recommendations for Other Services       Precautions / Restrictions Precautions Precautions: None Restrictions Weight Bearing Restrictions: No      Mobility  Bed Mobility Overal bed mobility: Modified Independent                Transfers Overall transfer level: Modified independent Equipment used: Rolling walker (2 wheeled)                Ambulation/Gait Ambulation/Gait assistance: Supervision Gait Distance (Feet): 250 Feet Assistive device: Rolling walker (2 wheeled) Gait Pattern/deviations: Step-through pattern;Decreased stride length Gait velocity: decreased   General Gait Details: pt with slow, steady gait with RW; pt taking two brief standing rest breaks secondary to pain  Stairs            Wheelchair Mobility    Modified Rankin (Stroke Patients Only)        Balance Overall balance assessment: Needs assistance Sitting-balance support: Feet supported Sitting balance-Leahy Scale: Good     Standing balance support: No upper extremity supported Standing balance-Leahy Scale: Fair                               Pertinent Vitals/Pain Pain Assessment: Faces Faces Pain Scale: Hurts whole lot Pain Location: stomach Pain Descriptors / Indicators: Guarding;Grimacing Pain Intervention(s): Monitored during session;Repositioned    Home Living Family/patient expects to be discharged to:: Private residence Living Arrangements: Other relatives Available Help at Discharge: Family Type of Home: House         Home Equipment: Environmental consultant - 4 wheels      Prior Function Level of Independence: Independent with assistive device(s)         Comments: ambulates with a rollator     Hand Dominance        Extremity/Trunk Assessment   Upper Extremity Assessment Upper Extremity Assessment: Overall WFL for tasks assessed    Lower Extremity Assessment Lower Extremity Assessment: Overall WFL for tasks assessed       Communication   Communication: HOH  Cognition Arousal/Alertness: Awake/alert Behavior During Therapy: WFL for tasks assessed/performed;Flat affect Overall Cognitive Status: Within Functional Limits for tasks assessed  General Comments      Exercises     Assessment/Plan    PT Assessment Patent does not need any further PT services  PT Problem List         PT Treatment Interventions      PT Goals (Current goals can be found in the Care Plan section)  Acute Rehab PT Goals Patient Stated Goal: decrease pain PT Goal Formulation: All assessment and education complete, DC therapy    Frequency     Barriers to discharge        Co-evaluation               AM-PAC PT "6 Clicks" Mobility  Outcome Measure Help needed turning from your back to your  side while in a flat bed without using bedrails?: None Help needed moving from lying on your back to sitting on the side of a flat bed without using bedrails?: None Help needed moving to and from a bed to a chair (including a wheelchair)?: None Help needed standing up from a chair using your arms (e.g., wheelchair or bedside chair)?: None Help needed to walk in hospital room?: None Help needed climbing 3-5 steps with a railing? : A Little 6 Click Score: 23    End of Session   Activity Tolerance: Patient tolerated treatment well Patient left: in bed;with call bell/phone within reach Nurse Communication: Mobility status PT Visit Diagnosis: Pain Pain - part of body: (abdomen)    Time: 1444-1500 PT Time Calculation (min) (ACUTE ONLY): 16 min   Charges:   PT Evaluation $PT Eval Low Complexity: 1 Low          Sherie Don, PT, DPT  Acute Rehabilitation Services Pager [phone] Office [place] 11/15/2018, 3:14 PM

## 2018-11-15 NOTE — Progress Notes (Signed)
Patient ID: [name], female   DOB: 10/28/1950, 68 y.o.   MRN: [id number] [facility] Progress Note:   4 Days Post-Op  Subjective: Mental status is clear but frustrated trying to use the telephone Objective: Vital signs in last 24 hours: Temp:  [98 F (36.7 C)-99 F (37.2 C)] 98 F (36.7 C) (10/10 0419) Pulse Rate:  [72-77] 72 (10/10 0419) Resp:  [18-19] 18 (10/10 0419) BP: (95-105)/(72-75) 105/75 (10/10 0419) SpO2:  [100 %] 100 % (10/10 0419)  Intake/Output from previous day: 10/09 0701 - 10/10 0700 In: 700 [P.O.:700] Out: 2176 [Urine:2175; Stool:1] Intake/Output this shift: No intake/output data recorded.  Physical Exam: Work of breathing is not labored;  JP serosanguinous;  Dressing in place  Lab Results:  Results for orders placed or performed during the hospital encounter of 11/11/18 (from the past 48 hour(s))  Glucose, capillary     Status: Abnormal   Collection Time: 11/13/18  8:04 AM  Result Value Ref Range   Glucose-Capillary 104 (H) 70 - 99 mg/dL  Glucose, capillary     Status: Abnormal   Collection Time: 11/13/18 12:42 PM  Result Value Ref Range   Glucose-Capillary 220 (H) 70 - 99 mg/dL  Glucose, capillary     Status: Abnormal   Collection Time: 11/13/18  5:53 PM  Result Value Ref Range   Glucose-Capillary 150 (H) 70 - 99 mg/dL  Glucose, capillary     Status: Abnormal   Collection Time: 11/13/18  9:55 PM  Result Value Ref Range   Glucose-Capillary 104 (H) 70 - 99 mg/dL  CBC     Status: Abnormal   Collection Time: 11/14/18  8:17 AM  Result Value Ref Range   WBC 9.8 4.0 - 10.5 K/uL   RBC 3.01 (L) 3.87 - 5.11 MIL/uL   Hemoglobin 8.5 (L) 12.0 - 15.0 g/dL   HCT 26.6 (L) 36.0 - 46.0 %   MCV 88.4 80.0 - 100.0 fL   MCH 28.2 26.0 - 34.0 pg   MCHC 32.0 30.0 - 36.0 g/dL   RDW 19.7 (H) 11.5 - 15.5 %   Platelets 315 150 - 400 K/uL   nRBC 0.6 (H) 0.0 - 0.2 %    Comment: Performed at [facility], [address], [place], [place] [address]   Comprehensive metabolic panel     Status: Abnormal   Collection Time: 11/14/18  8:17 AM  Result Value Ref Range   Sodium 137 135 - 145 mmol/L   Potassium 4.3 3.5 - 5.1 mmol/L   Chloride 108 98 - 111 mmol/L   CO2 22 22 - 32 mmol/L   Glucose, Bld 109 (H) 70 - 99 mg/dL   BUN 22 8 - 23 mg/dL   Creatinine, Ser 1.74 (H) 0.44 - 1.00 mg/dL   Calcium 8.6 (L) 8.9 - 10.3 mg/dL   Total Protein 6.6 6.5 - 8.1 g/dL   Albumin 2.7 (L) 3.5 - 5.0 g/dL   AST 27 15 - 41 U/L   ALT 30 0 - 44 U/L   Alkaline Phosphatase 130 (H) 38 - 126 U/L   Total Bilirubin 0.9 0.3 - 1.2 mg/dL   GFR calc non Af Amer 29 (L) >60 mL/min   GFR calc Af Amer 34 (L) >60 mL/min   Anion gap 7 5 - 15    Comment: Performed at [facility] [address] [place], [place] [address]  Glucose, capillary     Status: Abnormal   Collection Time: 11/14/18  8:26 AM  Result Value Ref Range   Glucose-Capillary 100 (H) 70 - 99 mg/dL  Glucose, capillary     Status: Abnormal   Collection Time: 11/14/18 12:20 PM  Result Value Ref Range   Glucose-Capillary 160 (H) 70 - 99 mg/dL  Glucose, capillary     Status: Abnormal   Collection Time: 11/14/18  4:43 PM  Result Value Ref Range   Glucose-Capillary 65 (L) 70 - 99 mg/dL  Glucose, capillary     Status: None   Collection Time: 11/14/18  5:09 PM  Result Value Ref Range   Glucose-Capillary 85 70 - 99 mg/dL  Glucose, capillary     Status: Abnormal   Collection Time: 11/14/18  9:52 PM  Result Value Ref Range   Glucose-Capillary 142 (H) 70 - 99 mg/dL   Comment 1 Notify RN    Comment 2 Document in Chart   Comprehensive metabolic panel     Status: Abnormal   Collection Time: 11/15/18  3:56 AM  Result Value Ref Range   Sodium 139 135 - 145 mmol/L   Potassium 4.2 3.5 - 5.1 mmol/L   Chloride 106 98 - 111 mmol/L   CO2 25 22 - 32 mmol/L   Glucose, Bld 110 (H) 70 - 99 mg/dL   BUN 16 8 - 23 mg/dL   Creatinine, Ser 1.57 (H) 0.44 - 1.00 mg/dL   Calcium 8.7 (L) 8.9 - 10.3 mg/dL   Total  Protein 6.2 (L) 6.5 - 8.1 g/dL   Albumin 2.4 (L) 3.5 - 5.0 g/dL   AST 41 15 - 41 U/L   ALT 43 0 - 44 U/L   Alkaline Phosphatase 147 (H) 38 - 126 U/L   Total Bilirubin 1.0 0.3 - 1.2 mg/dL   GFR calc non Af Amer 33 (L) >60 mL/min   GFR calc Af Amer 38 (L) >60 mL/min   Anion gap 8 5 - 15    Comment: Performed at [facility], [address], [place], [place] [address]  CBC     Status: Abnormal   Collection Time: 11/15/18  3:56 AM  Result Value Ref Range   WBC 9.0 4.0 - 10.5 K/uL   RBC 2.85 (L) 3.87 - 5.11 MIL/uL   Hemoglobin 7.8 (L) 12.0 - 15.0 g/dL   HCT 25.9 (L) 36.0 - 46.0 %   MCV 90.9 80.0 - 100.0 fL   MCH 27.4 26.0 - 34.0 pg   MCHC 30.1 30.0 - 36.0 g/dL   RDW 18.5 (H) 11.5 - 15.5 %   Platelets 340 150 - 400 K/uL   nRBC 0.7 (H) 0.0 - 0.2 %    Comment: Performed at [facility] [address], [place], [place] [address]  Glucose, capillary     Status: Abnormal   Collection Time: 11/15/18  7:43 AM  Result Value Ref Range   Glucose-Capillary 140 (H) 70 - 99 mg/dL   Comment 1 Notify RN     Radiology/Results: No results found.  Anti-infectives: Anti-infectives (From admission, onward)   Start     Dose/Rate Route Frequency Ordered Stop   11/11/18 0748  ceFAZolin (ANCEF) 2-4 GM/100ML-% IVPB    Note to Pharmacy: Marga Melnick   : cabinet override      11/11/18 0748 11/11/18 0840   11/11/18 0745  ceFAZolin (ANCEF) IVPB 2g/100 mL premix     2 g 200 mL/hr over 30 Minutes Intravenous On call to O.R. 11/11/18 8850 11/11/18 0840  Assessment/Plan: Problem List: Patient Active Problem List   Diagnosis Date Noted  . Chronic cholecystitis 11/11/2018  . Hemobilia   . Dilated bile duct   . Jaundice   . Elevated LFTs   . Choledocholithiasis 10/02/2018  . CKD (chronic kidney disease), stage III   . Acute cholecystitis   . Elevated liver enzymes 09/18/2018  . Calculus of gallbladder and bile duct with acute cholecystitis, with obstruction   . Melena   .  Unstable angina ([place]) 07/12/2018  . Port-A-Cath in place 02/27/2018  . Cancer of overlapping sites of right breast ([place]) 11/27/2017  . Malignant neoplasm of upper-outer quadrant of right breast in female, estrogen receptor positive ([place]) 10/03/2017  . Carpal tunnel syndrome, bilateral 11/26/2016  . 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 Abdominal pain, epigastric   . Acute on chronic renal failure ([place]) 02/19/2015  . Diarrhea   . Nausea with vomiting   . Hyperglycemia 04/01/2014  . Hyperosmolar non-ketotic state in patient with type 2 diabetes mellitus ([place]) 04/01/2014  . Nausea vomiting and diarrhea 04/01/2014  . CAD (coronary artery disease) 04/01/2014  . Dehydration   . Diabetic ketoacidosis without coma associated with diabetes mellitus due to underlying condition ([place])   . High anion gap metabolic acidosis   . Cocaine abuse ([place])   . Toxic encephalopathy-Unlikely metabolic,?psychogenic 08/02/2012  . Bereavement 02/02/2012  . Depression 02/02/2012  . Iron deficiency anemia, unspecified 11/30/2011  . Nonspecific abnormal finding in stool contents 11/30/2011  . Benign neoplasm of colon 11/30/2011  . Cough 05/14/2011  . Chest pain 03/19/2011  . Macrocytic anemia 03/19/2011  . Insulin dependent diabetes mellitus 03/19/2011  . DOE (dyspnea on exertion) 03/19/2011  . Chest pain at rest 02/18/2011    Class: Acute  . Cholelithiasis 11/01/2010  . DM 04/08/2009  . HYPERLIPIDEMIA 04/08/2009  . Essential hypertension 04/08/2009  . MYOCARDIAL INFARCTION 04/08/2009  . Coronary atherosclerosis 04/08/2009  . GERD 04/08/2009  . FATTY LIVER DISEASE 04/08/2009  . COLONIC POLYPS, HX OF 04/08/2009    Creatinine is 1.57 and lytes OK.  Slow progress after open cholecystectomy 4 Days Post-Op    LOS: 4 days   Matt B. Hassell Done, MD, [facility] [phone] beeper [phone]  11/15/2018 8:03 AM

## 2018-11-16 LAB — GLUCOSE, CAPILLARY
Glucose-Capillary: 156 mg/dL — ABNORMAL HIGH (ref 70–99)
Glucose-Capillary: 130 mg/dL — ABNORMAL HIGH (ref 70–99)
Glucose-Capillary: 92 mg/dL (ref 70–99)
Glucose-Capillary: 170 mg/dL — ABNORMAL HIGH (ref 70–99)

## 2018-11-16 NOTE — Progress Notes (Signed)
Patient ID: [name], female   DOB: 24-Aug-1950, 68 y.o.   MRN: [id number] [facility] Progress Note:   5 Days Post-Op  Subjective: Mental status is more alert;   Objective: Vital signs in last 24 hours: Temp:  [98.5 F (36.9 C)-99 F (37.2 C)] 99 F (37.2 C) (10/11 0806) Pulse Rate:  [65-73] 73 (10/11 0806) Resp:  [18-22] 22 (10/11 0806) BP: (112-128)/(75-85) 114/85 (10/11 0806) SpO2:  [95 %-100 %] 100 % (10/11 0806)  Intake/Output from previous day: 10/10 0701 - 10/11 0700 In: 1644.5 [P.O.:520; I.V.:1124.5] Out: 330 [Urine:300; Drains:30] Intake/Output this shift: No intake/output data recorded.  Physical Exam: Work of breathing is normal;  No BM in ~ 4 days.  Mild distention  Lab Results:  Results for orders placed or performed during the hospital encounter of 11/11/18 (from the past 48 hour(s))  Glucose, capillary     Status: Abnormal   Collection Time: 11/14/18 12:20 PM  Result Value Ref Range   Glucose-Capillary 160 (H) 70 - 99 mg/dL  Glucose, capillary     Status: Abnormal   Collection Time: 11/14/18  4:43 PM  Result Value Ref Range   Glucose-Capillary 65 (L) 70 - 99 mg/dL  Glucose, capillary     Status: None   Collection Time: 11/14/18  5:09 PM  Result Value Ref Range   Glucose-Capillary 85 70 - 99 mg/dL  Glucose, capillary     Status: Abnormal   Collection Time: 11/14/18  9:52 PM  Result Value Ref Range   Glucose-Capillary 142 (H) 70 - 99 mg/dL   Comment 1 Notify RN    Comment 2 Document in Chart   Comprehensive metabolic panel     Status: Abnormal   Collection Time: 11/15/18  3:56 AM  Result Value Ref Range   Sodium 139 135 - 145 mmol/L   Potassium 4.2 3.5 - 5.1 mmol/L   Chloride 106 98 - 111 mmol/L   CO2 25 22 - 32 mmol/L   Glucose, Bld 110 (H) 70 - 99 mg/dL   BUN 16 8 - 23 mg/dL   Creatinine, Ser 1.57 (H) 0.44 - 1.00 mg/dL   Calcium 8.7 (L) 8.9 - 10.3 mg/dL   Total Protein 6.2 (L) 6.5 - 8.1 g/dL   Albumin 2.4 (L) 3.5 - 5.0 g/dL   AST 41 15 - 41 U/L   ALT 43 0 - 44 U/L   Alkaline Phosphatase 147 (H) 38 - 126 U/L   Total Bilirubin 1.0 0.3 - 1.2 mg/dL   GFR calc non Af Amer 33 (L) >60 mL/min   GFR calc Af Amer 38 (L) >60 mL/min   Anion gap 8 5 - 15    Comment: Performed at [facility], [address] [place], [place] [address]  CBC     Status: Abnormal   Collection Time: 11/15/18  3:56 AM  Result Value Ref Range   WBC 9.0 4.0 - 10.5 K/uL   RBC 2.85 (L) 3.87 - 5.11 MIL/uL   Hemoglobin 7.8 (L) 12.0 - 15.0 g/dL   HCT 25.9 (L) 36.0 - 46.0 %   MCV 90.9 80.0 - 100.0 fL   MCH 27.4 26.0 - 34.0 pg   MCHC 30.1 30.0 - 36.0 g/dL   RDW 18.5 (H) 11.5 - 15.5 %   Platelets 340 150 - 400 K/uL   nRBC 0.7 (H) 0.0 - 0.2 %    Comment: Performed at [facility] [address] [place], [place] [address]  Glucose, capillary     Status: Abnormal   Collection Time: 11/15/18  7:43 AM  Result Value Ref Range   Glucose-Capillary 140 (H) 70 - 99 mg/dL   Comment 1 Notify RN   Glucose, capillary     Status: None   Collection Time: 11/15/18 11:55 AM  Result Value Ref Range   Glucose-Capillary 93 70 - 99 mg/dL   Comment 1 Notify RN   Glucose, capillary     Status: Abnormal   Collection Time: 11/15/18  5:10 PM  Result Value Ref Range   Glucose-Capillary 124 (H) 70 - 99 mg/dL  Glucose, capillary     Status: Abnormal   Collection Time: 11/15/18  9:21 PM  Result Value Ref Range   Glucose-Capillary 140 (H) 70 - 99 mg/dL  Glucose, capillary     Status: Abnormal   Collection Time: 11/16/18  7:40 AM  Result Value Ref Range   Glucose-Capillary 156 (H) 70 - 99 mg/dL   Comment 1 Notify RN    Comment 2 Document in Chart     Radiology/Results: No results found.  Anti-infectives: Anti-infectives (From admission, onward)   Start     Dose/Rate Route Frequency Ordered Stop   11/11/18 0748  ceFAZolin (ANCEF) 2-4 GM/100ML-% IVPB    Note to Pharmacy: Marga Melnick   : cabinet override      11/11/18 0748 11/11/18 0840    11/11/18 0745  ceFAZolin (ANCEF) IVPB 2g/100 mL premix     2 g 200 mL/hr over 30 Minutes Intravenous On call to O.R. 11/11/18 0743 11/11/18 0840      Assessment/Plan: Problem List: Patient Active Problem List   Diagnosis Date Noted  . Chronic cholecystitis 11/11/2018  . Hemobilia   . Dilated bile duct   . Jaundice   . Elevated LFTs   . Choledocholithiasis 10/02/2018  . CKD (chronic kidney disease), stage III   . Acute cholecystitis   . Elevated liver enzymes 09/18/2018  . Calculus of gallbladder and bile duct with acute cholecystitis, with obstruction   . Melena   . Unstable angina ([place]) 07/12/2018  . Port-A-Cath in place 02/27/2018  . Cancer of overlapping sites of right breast ([place]) 11/27/2017  . Malignant neoplasm of upper-outer quadrant of right breast in female, estrogen receptor positive ([place]) 10/03/2017  . Carpal tunnel syndrome, bilateral 11/26/2016  . 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 Abdominal pain, epigastric   . Acute on chronic renal failure ([place]) 02/19/2015  . Diarrhea   . Nausea with vomiting   . Hyperglycemia 04/01/2014  . Hyperosmolar non-ketotic state in patient with type 2 diabetes mellitus ([place]) 04/01/2014  . Nausea vomiting and diarrhea 04/01/2014  . CAD (coronary artery disease) 04/01/2014  . Dehydration   . Diabetic ketoacidosis without coma associated with diabetes mellitus due to underlying condition ([place])   . High anion gap metabolic acidosis   . Cocaine abuse ([place])   . Toxic encephalopathy-Unlikely metabolic,?psychogenic 08/02/2012  . Bereavement 02/02/2012  . Depression 02/02/2012  . Iron deficiency anemia, unspecified 11/30/2011  . Nonspecific abnormal finding in stool contents 11/30/2011  . Benign neoplasm of colon 11/30/2011  . Cough 05/14/2011  . Chest pain 03/19/2011  . Macrocytic anemia 03/19/2011  . Insulin dependent diabetes mellitus 03/19/2011  . DOE (dyspnea on exertion) 03/19/2011  . Chest pain at rest 02/18/2011     Class: Acute  . Cholelithiasis 11/01/2010  . DM 04/08/2009  . HYPERLIPIDEMIA 04/08/2009  . Essential hypertension 04/08/2009  . MYOCARDIAL INFARCTION 04/08/2009  .  Coronary atherosclerosis 04/08/2009  . GERD 04/08/2009  . FATTY LIVER DISEASE 04/08/2009  . COLONIC POLYPS, HX OF 04/08/2009    Incisions are doing just fine with staples.  Will try to get bowels moving and hopeful discharge tomorrow.   5 Days Post-Op    LOS: 5 days   Matt B. Hassell Done, MD, [facility] [phone] beeper [phone]  11/16/2018 9:01 AM

## 2018-11-17 ENCOUNTER — Inpatient Hospital Stay (HOSPITAL_COMMUNITY): Payer: Medicare Other

## 2018-11-17 ENCOUNTER — Other Ambulatory Visit: Payer: Self-pay

## 2018-11-17 LAB — CBC
HCT: 28.4 % — ABNORMAL LOW (ref 36.0–46.0)
Hemoglobin: 8.6 g/dL — ABNORMAL LOW (ref 12.0–15.0)
MCH: 27.5 pg (ref 26.0–34.0)
MCHC: 30.3 g/dL (ref 30.0–36.0)
MCV: 90.7 fL (ref 80.0–100.0)
Platelets: 413 10*3/uL — ABNORMAL HIGH (ref 150–400)
RBC: 3.13 MIL/uL — ABNORMAL LOW (ref 3.87–5.11)
RDW: 17.2 % — ABNORMAL HIGH (ref 11.5–15.5)
WBC: 8.3 10*3/uL (ref 4.0–10.5)
nRBC: 0 % (ref 0.0–0.2)

## 2018-11-17 LAB — COMPREHENSIVE METABOLIC PANEL
ALT: 47 U/L — ABNORMAL HIGH (ref 0–44)
AST: 38 U/L (ref 15–41)
Albumin: 2.5 g/dL — ABNORMAL LOW (ref 3.5–5.0)
Alkaline Phosphatase: 171 U/L — ABNORMAL HIGH (ref 38–126)
Anion gap: 7 (ref 5–15)
BUN: 13 mg/dL (ref 8–23)
CO2: 27 mmol/L (ref 22–32)
Calcium: 8.5 mg/dL — ABNORMAL LOW (ref 8.9–10.3)
Chloride: 104 mmol/L (ref 98–111)
Creatinine, Ser: 1.33 mg/dL — ABNORMAL HIGH (ref 0.44–1.00)
GFR calc Af Amer: 47 mL/min — ABNORMAL LOW (ref >60)
GFR calc non Af Amer: 40 mL/min — ABNORMAL LOW (ref >60)
Glucose, Bld: 189 mg/dL — ABNORMAL HIGH (ref 70–99)
Potassium: 3.7 mmol/L (ref 3.5–5.1)
Sodium: 138 mmol/L (ref 135–145)
Total Bilirubin: 0.8 mg/dL (ref 0.3–1.2)
Total Protein: 7 g/dL (ref 6.5–8.1)

## 2018-11-17 LAB — GLUCOSE, CAPILLARY
Glucose-Capillary: 118 mg/dL — ABNORMAL HIGH (ref 70–99)
Glucose-Capillary: 172 mg/dL — ABNORMAL HIGH (ref 70–99)
Glucose-Capillary: 123 mg/dL — ABNORMAL HIGH (ref 70–99)
Glucose-Capillary: 123 mg/dL — ABNORMAL HIGH (ref 70–99)

## 2018-11-17 LAB — TROPONIN I (HIGH SENSITIVITY)
Troponin I (High Sensitivity): 5 ng/L (ref <18)
Troponin I (High Sensitivity): 7 ng/L (ref <18)

## 2018-11-17 LAB — LIPASE, BLOOD: Lipase: 37 U/L (ref 11–51)

## 2018-11-17 MED ORDER — ENOXAPARIN SODIUM 40 MG/0.4ML ~~LOC~~ SOLN
40.0000 mg | SUBCUTANEOUS | Status: DC
  Administered 2018-11-18: 40 mg via SUBCUTANEOUS
  Filled 2018-11-17: qty 0.4

## 2018-11-17 MED ORDER — ASPIRIN EC 81 MG PO TBEC
81.0000 mg | DELAYED_RELEASE_TABLET | Freq: Every day | ORAL | Status: DC
  Administered 2018-11-17 – 2018-11-18 (×2): 81 mg via ORAL
  Filled 2018-11-17 (×3): qty 1

## 2018-11-17 NOTE — Progress Notes (Signed)
Pt states her chest pain and SOB has resolved after taking nitroglycerin. I spoke with Dr. Terrence Dupont who stated he will come see the patient.   Jackson Latino, [facility] Pager [phone]

## 2018-11-17 NOTE — Progress Notes (Addendum)
[facility]/Trauma Progress Note  6 Days Post-Op   Assessment/Plan History of MI s/p cath 2013 with stents placed HTN-Home meds GERD-Protonix Diabetes-SSI CAD- aspirin COPD  History of cocaine abuse History of right breast cancer-mastectomy2019 -home med History of right colectomy 2009  Chronic cholecystitis - S/p laparoscopic converted to open cholecystectomy with IOC, Dr. Ninfa Linden, 10/06 -Pain control, ambulate, IS -LFTs and Tbili WNL, alk phos trending down ABLA- hemoglobin 7.5on admission, 4.910/07 and transfused2 units,today Hgb is 7.8. stable Hyperkalemia-improved Acute on chronic kidney disease-creatinine improved,IVF, monitor Chest pain and SOB, hx of MI - EKG, CXR and troponins pending  EFE:OFHQR healthy carb mod VTE: SCD's, lovenox FX:JOITG preop only,WBC9.0, afebrile Foley:I&O cath x110/06 Follow up:Dr. Ninfa Linden  Dispo: chest pain workup, CMP and lipase pending. If improves maybe home this afternoon    LOS: 6 days    Subjective: CC: sternal chest pain radiating into her back and SOB  Pt states she has chest pain at home sometimes that she takes nitroglycerin for and it improves. She is also having new SOB today that she was not having yesterday. Mild nausea. No vomiting. She is having bowel function and tolerating her diet. No other issues overnight.   Objective: Vital signs in last 24 hours: Temp:  [98.3 F (36.8 C)-98.7 F (37.1 C)] 98.7 F (37.1 C) (10/12 0807) Pulse Rate:  [60-69] 60 (10/12 0807) Resp:  [15-18] 18 (10/12 0807) BP: (117-137)/(67-82) 122/81 (10/12 0807) SpO2:  [98 %-100 %] 100 % (10/12 0807) Last BM Date: 11/16/18  Intake/Output from previous day: 10/11 0701 - 10/12 0700 In: -  Out: 5498 [Urine:1600; Drains:5] Intake/Output this shift: No intake/output data recorded.  PE: Gen: Alert, NAD, pleasant, cooperative Card: RRR, no M/G/R heard Pulm: mild bibasilar rales, rate andeffort  normal, working on IS Abd: Soft, ND, +BS,RUQ incision and umbilical incisions with staples in place and without signs of infection. JP drain on right is serosanguineous. mild TTP to right side without guarding. No peritonitis Skin: no rashes noted, warm and dry  Anti-infectives: Anti-infectives (From admission, onward)   Start     Dose/Rate Route Frequency Ordered Stop   11/11/18 0748  ceFAZolin (ANCEF) 2-4 GM/100ML-% IVPB    Note to Pharmacy: Marga Melnick   : cabinet override      11/11/18 0748 11/11/18 0840   11/11/18 0745  ceFAZolin (ANCEF) IVPB 2g/100 mL premix     2 g 200 mL/hr over 30 Minutes Intravenous On call to O.R. 11/11/18 0743 11/11/18 0840      Lab Results:  Recent Labs    11/15/18 0356  WBC 9.0  HGB 7.8*  HCT 25.9*  PLT 340   BMET Recent Labs    11/15/18 0356  NA 139  K 4.2  CL 106  CO2 25  GLUCOSE 110*  BUN 16  CREATININE 1.57*  CALCIUM 8.7*   PT/INR No results for input(s): LABPROT, INR in the last 72 hours. CMP     Component Value Date/Time   NA 139 11/15/2018 0356   K 4.2 11/15/2018 0356   CL 106 11/15/2018 0356   CO2 25 11/15/2018 0356   GLUCOSE 110 (H) 11/15/2018 0356   BUN 16 11/15/2018 0356   CREATININE 1.57 (H) 11/15/2018 0356   CREATININE 1.88 (H) 11/06/2018 1337   CALCIUM 8.7 (L) 11/15/2018 0356   PROT 6.2 (L) 11/15/2018 0356   ALBUMIN 2.4 (L) 11/15/2018 0356   AST 41 11/15/2018 0356   AST 103 (H) 11/06/2018 1337   ALT 43  11/15/2018 0356   ALT 146 (H) 11/06/2018 1337   ALKPHOS 147 (H) 11/15/2018 0356   BILITOT 1.0 11/15/2018 0356   BILITOT 0.5 11/06/2018 1337   GFRNONAA 33 (L) 11/15/2018 0356   GFRNONAA 27 (L) 11/06/2018 1337   GFRAA 38 (L) 11/15/2018 0356   GFRAA 31 (L) 11/06/2018 1337   Lipase     Component Value Date/Time   LIPASE 82 (H) 10/02/2018 1020    Studies/Results: No results found.   Kalman Drape, [facility] Pager [phone] Cristine Polio, & Friday 7:00am - 4:30pm Thursdays  7:00am -11:30am

## 2018-11-17 NOTE — Consult Note (Signed)
Reason for Consult chest pain Referring Physician: CCS MD  [name] is an 68 y.o. female.  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 when she developed retrosternal chest pain radiating to the back lasting approximately 30 minutes as per patient received 1 sublingual nitro with relief patient denies any nausea vomiting diaphoresis states had mild shortness of breath.  States overall feels well now.  Patient had nuclear stress test in June 2020 which showed no evidence of ischemia 1 set of cardiac enzyme is negative EKG done earlier today showed normal sinus rhythm with LVH and nonspecific T wave changes no significant acute ischemic changes were noted.  Past Medical History:  Diagnosis Date  . Ambulates with cane    straight  . Angina    no current problems per patient at PAT appt 11/05/18  . Arthritis    HANDS"  . Asthma   . Cancer ([place]) 09/04/2017   Right Breast  . Carpal tunnel syndrome, bilateral 11/26/2016  . Cholelithiasis   . Cocaine abuse ([place]) 07/2012   per E.R. drug screen  . COPD (chronic obstructive pulmonary disease) ([place])    per patient  . Coronary artery disease   . Diabetes mellitus without mention of complication    type 2  . Dysphagia    esophageal dysmotility on 03/2011 esophagram   . Dyspnea    with exertion  . Fatty liver disease, nonalcoholic 6314   on Imaging.   Marland Kitchen GERD (gastroesophageal reflux disease)   . Headache   . Hearing loss     bilateral - no hearing aids  . History of colonic polyps 2009   adenomatous 2009, HP in 2009, 2011, 2013.   Marland Kitchen Hyperlipidemia   . Hypertension   . Iron deficiency anemia   . Myocardial infarction ([place])    years ago, 6 stents placed  . 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 Wears dentures    full upper and partial lower  . Wears glasses     Past Surgical History:  Procedure Laterality Date  . ABDOMINAL ANGIOGRAM  02/20/2011   Procedure: ABDOMINAL ANGIOGRAM;  Surgeon: Clent Demark, MD;  Location: [facility] CATH LAB;  Service: Cardiovascular;;  . CARPAL TUNNEL RELEASE Right 07/04/2017   Procedure: RIGHT CARPAL TUNNEL RELEASE;  Surgeon: Leanora Cover, MD;  Location: [place];  Service: Orthopedics;  Laterality: Right;  . CARPAL TUNNEL RELEASE Left 09/12/2017   Procedure: LEFT CARPAL TUNNEL RELEASE;  Surgeon: Leanora Cover, MD;  Location: [place];  Service: Orthopedics;  Laterality: Left;  . CHOLECYSTECTOMY N/A 11/11/2018   Procedure: ATTEMPTED LAPAROSCOPIC CHOLECUSTECOMY,  OPEN CHOLECYSTECTOMY;  Surgeon: Coralie Keens, MD;  Location: [place];  Service: General;  Laterality: N/A;  . COLONOSCOPY  11/30/2011   Procedure: COLONOSCOPY;  Surgeon: Lafayette Dragon, MD;  Location: [facility] ENDOSCOPY;  Service: Endoscopy;  Laterality: N/A;  . CORONARY ANGIOPLASTY WITH STENT PLACEMENT  02/20/2011  . ERCP N/A 10/03/2018   Procedure: ENDOSCOPIC RETROGRADE CHOLANGIOPANCREATOGRAPHY (ERCP);  Surgeon: Ladene Artist, MD;  Location: [facility];  Service: Endoscopy;  Laterality: N/A;  . ESOPHAGOGASTRODUODENOSCOPY  11/30/2011   Procedure: ESOPHAGOGASTRODUODENOSCOPY (EGD);  Surgeon: Lafayette Dragon, MD;  Location: [facility];  Service: Endoscopy;  Laterality: N/A;  . ESOPHAGOGASTRODUODENOSCOPY N/A 02/23/2015   Procedure: ESOPHAGOGASTRODUODENOSCOPY (EGD);  Surgeon: Gatha Mayer, MD;  Location: [facility] ENDOSCOPY;  Service: Endoscopy;  Laterality: N/A;  . EYE SURGERY Bilateral     cataracts removed  . INTRAOPERATIVE CHOLANGIOGRAM N/A 11/11/2018   Procedure: Intraoperative Cholangiogram;  Surgeon: Coralie Keens, MD;  Location: [place];  Service: General;  Laterality: N/A;  . LEFT HEART CATHETERIZATION WITH CORONARY ANGIOGRAM N/A 02/20/2011   Procedure: LEFT HEART CATHETERIZATION WITH CORONARY ANGIOGRAM;  Surgeon: Clent Demark, MD;  Location: [place] CATH LAB;  Service: Cardiovascular;  Laterality: N/A;  . MASTECTOMY Right 11/05/2017  . MASTECTOMY W/ SENTINEL NODE BIOPSY Right 11/27/2017  . MASTECTOMY W/ SENTINEL NODE BIOPSY Right 11/27/2017   Procedure: RIGHT TOTAL MASTECTOMY WITH SENTINEL LYMPH NODE BIOPSY;  Surgeon: Excell Seltzer, MD;  Location: [place];  Service: General;  Laterality: Right;  . PORT A CATH REVISION Left 05/07/2018   Procedure: PORT A CATH REVISION;  Surgeon: Excell Seltzer, MD;  Location: [facility];  Service: General;  Laterality: Left;  . PORTACATH PLACEMENT Left 11/27/2017   Procedure: INSERTION PORT-A-CATH;  Surgeon: Excell Seltzer, MD;  Location: [place];  Service: General;  Laterality: Left;  . RIGHT COLECTOMY  02/2007   for post polypectomy colonic perforation  . SPHINCTEROTOMY  10/03/2018   Procedure: SPHINCTEROTOMY;  Surgeon: Ladene Artist, MD;  Location: [facility];  Service: Endoscopy;;  . TUBAL LIGATION      Family History  Problem Relation Age of Onset  . Heart disease Mother   . Diabetes Mother   . Cancer Father        unsure what kind  . Diabetes Sister   . Colon cancer Neg Hx   . Breast cancer Neg Hx     Social History:  reports that she quit smoking about 8 years ago. Her smoking use included cigarettes. She has a 23.50 pack-year smoking history. She has never used smokeless tobacco. She reports previous drug use. Drug: Cocaine. She reports that she does not drink alcohol.  Allergies: No Known Allergies  Medications: I have reviewed the patient's current medications.  Results for orders placed or performed during the  hospital encounter of 11/11/18 (from the past 48 hour(s))  Glucose, capillary     Status: Abnormal   Collection Time: 11/15/18  5:10 PM  Result Value Ref Range   Glucose-Capillary 124 (H) 70 - 99 mg/dL  Glucose, capillary     Status: Abnormal   Collection Time: 11/15/18  9:21 PM  Result Value Ref Range   Glucose-Capillary 140 (H) 70 - 99 mg/dL  Glucose, capillary     Status: Abnormal   Collection Time: 11/16/18  7:40 AM  Result Value Ref Range   Glucose-Capillary 156 (H) 70 - 99 mg/dL   Comment 1 Notify RN    Comment 2 Document in Chart   Glucose, capillary     Status: Abnormal   Collection Time: 11/16/18 11:44 AM  Result Value Ref Range   Glucose-Capillary 130 (H) 70 - 99 mg/dL   Comment 1 Notify RN    Comment 2 Document in Chart   Glucose, capillary     Status: None   Collection Time: 11/16/18  4:04 PM  Result Value Ref Range   Glucose-Capillary 92 70 - 99 mg/dL   Comment 1 Notify RN  Comment 2 Document in Chart   Glucose, capillary     Status: Abnormal   Collection Time: 11/16/18  9:54 PM  Result Value Ref Range   Glucose-Capillary 170 (H) 70 - 99 mg/dL  Glucose, capillary     Status: Abnormal   Collection Time: 11/17/18  8:33 AM  Result Value Ref Range   Glucose-Capillary 118 (H) 70 - 99 mg/dL  Troponin I (High Sensitivity)     Status: None   Collection Time: 11/17/18 12:08 PM  Result Value Ref Range   Troponin I (High Sensitivity) 5 <18 ng/L    Comment: (NOTE) Elevated high sensitivity troponin I (hsTnI) values and significant  changes across serial measurements may suggest ACS but many other  chronic and acute conditions are known to elevate hsTnI results.  Refer to the "Links" section for chest pain algorithms and additional  guidance. Performed at [facility], [place] [address], [place], [place] [address]   CBC     Status: Abnormal   Collection Time: 11/17/18 12:08 PM  Result Value Ref Range   WBC 8.3 4.0 - 10.5 K/uL   RBC 3.13 (L) 3.87 - 5.11 MIL/uL    Hemoglobin 8.6 (L) 12.0 - 15.0 g/dL   HCT 28.4 (L) 36.0 - 46.0 %   MCV 90.7 80.0 - 100.0 fL   MCH 27.5 26.0 - 34.0 pg   MCHC 30.3 30.0 - 36.0 g/dL   RDW 17.2 (H) 11.5 - 15.5 %   Platelets 413 (H) 150 - 400 K/uL   nRBC 0.0 0.0 - 0.2 %    Comment: Performed at [facility] [address], [place], [place] [address]  Comprehensive metabolic panel     Status: Abnormal   Collection Time: 11/17/18 12:08 PM  Result Value Ref Range   Sodium 138 135 - 145 mmol/L   Potassium 3.7 3.5 - 5.1 mmol/L   Chloride 104 98 - 111 mmol/L   CO2 27 22 - 32 mmol/L   Glucose, Bld 189 (H) 70 - 99 mg/dL   BUN 13 8 - 23 mg/dL   Creatinine, Ser 1.33 (H) 0.44 - 1.00 mg/dL   Calcium 8.5 (L) 8.9 - 10.3 mg/dL   Total Protein 7.0 6.5 - 8.1 g/dL   Albumin 2.5 (L) 3.5 - 5.0 g/dL   AST 38 15 - 41 U/L   ALT 47 (H) 0 - 44 U/L   Alkaline Phosphatase 171 (H) 38 - 126 U/L   Total Bilirubin 0.8 0.3 - 1.2 mg/dL   GFR calc non Af Amer 40 (L) >60 mL/min   GFR calc Af Amer 47 (L) >60 mL/min   Anion gap 7 5 - 15    Comment: Performed at [facility], [place] [address] [place], [place] [address]  Lipase, blood     Status: None   Collection Time: 11/17/18 12:08 PM  Result Value Ref Range   Lipase 37 11 - 51 U/L    Comment: Performed at [facility] [address] [place], [place] [address]  Glucose, capillary     Status: Abnormal   Collection Time: 11/17/18 12:44 PM  Result Value Ref Range   Glucose-Capillary 172 (H) 70 - 99 mg/dL    Dg Chest Port 1 View  Result Date: 11/17/2018 CLINICAL DATA:  NEW ONSET STERNAL PAIN RADIATING INTO BACK HX MI/STENTS 2013, CAD, COPD, RT MASTECTOMY CHRONIC CHOLECYSTITIS ,former smoker EXAM: PORTABLE CHEST 1 VIEW COMPARISON:  07/12/2018 FINDINGS: Power port with tip  in the mid SVC. Normal cardiac silhouette. No effusion, infiltrate, or pneumothorax. No acute osseous abnormality. IMPRESSION: No acute cardiopulmonary process. Electronically Signed   By: Suzy Bouchard  M.D.   On: 11/17/2018 09:30    Review of Systems  Constitutional: Negative for chills and fever.  HENT: Negative for hearing loss.   Eyes: Negative for blurred vision.  Respiratory: Positive for shortness of breath.   Cardiovascular: Positive for chest pain. Negative for orthopnea, claudication and leg swelling.  Gastrointestinal: Negative for nausea and vomiting.  Genitourinary: Negative for dysuria.  Skin: Negative for rash.  Neurological: Negative for dizziness.   Blood pressure 122/61, pulse (!) 58, temperature 98.4 F (36.9 C), temperature source Oral, resp. rate 18, height 4\' 11"  (1.499 m), weight 58.1 kg, SpO2 100 %. Physical Exam  Constitutional: She is oriented to person, place, and time.  Eyes: Pupils are equal, round, and reactive to light. Conjunctivae are normal. Left eye exhibits no discharge. No scleral icterus.  Neck: Normal range of motion. Neck supple. No JVD present. No tracheal deviation present. No thyromegaly present.  Cardiovascular: Regular rhythm.  Murmur (Soft systolic murmur noted no S3 gallop) heard. Respiratory:  Decreased breath sounds at bases  GI: Soft. Bowel sounds are normal. She exhibits distension. There is no abdominal tenderness.  Surgical dressing and staples dry, JP bulb noted with minimal amount of fluid  Musculoskeletal:        General: No tenderness, deformity or edema.  Neurological: She is alert and oriented to person, place, and time.    Assessment/Plan: Chronic cholecystitis status post open cholecystectomy postop day 6 Status post atypical chest pain Coronary artery disease history of PCI to RCA and left circumflex in the past recently had negative nuclear stress test Hypertension Insulin requiring diabetes mellitus Hyperlipidemia Malignant carcinoma of the right breast status post mastectomy GERD History of bronchial asthma History of colonic polyp Anemia of chronic disease Chronic kidney disease stage III Plan Agree with  present management We will check EKG and troponin in a.m. if negative okay to discharge from cardiac point of view Charolette Forward 11/17/2018, 3:48 PM

## 2018-11-18 ENCOUNTER — Other Ambulatory Visit: Payer: Self-pay

## 2018-11-18 LAB — GLUCOSE, CAPILLARY: Glucose-Capillary: 102 mg/dL — ABNORMAL HIGH (ref 70–99)

## 2018-11-18 LAB — TROPONIN I (HIGH SENSITIVITY): Troponin I (High Sensitivity): 6 ng/L (ref <18)

## 2018-11-18 MED ORDER — HEPARIN SOD (PORK) LOCK FLUSH 100 UNIT/ML IV SOLN
500.0000 [IU] | INTRAVENOUS | Status: AC | PRN
  Administered 2018-11-18: 500 [IU]
  Filled 2018-11-18: qty 5

## 2018-11-18 MED ORDER — TRAMADOL HCL 50 MG PO TABS: 50.0000 mg | ORAL_TABLET | Freq: Four times a day (QID) | ORAL | 0 refills | Status: DC | PRN

## 2018-11-18 NOTE — Discharge Summary (Signed)
[facility]/Trauma Discharge Summary   Patient ID: [name] MRN: [id number] DOB/AGE: 70-17-1952 70 y.o.  Admit date: 11/11/2018 Discharge date: 11/18/2018  Admitting Diagnosis: Chronic cholecystitis with gallstones  Discharge Diagnosis Patient Active Problem List   Diagnosis Date Noted  . Chronic cholecystitis 11/11/2018  . Hemobilia   . Dilated bile duct   . Jaundice   . Elevated LFTs   . Choledocholithiasis 10/02/2018  . CKD (chronic kidney disease), stage III   . Acute cholecystitis   . Elevated liver enzymes 09/18/2018  . Calculus of gallbladder and bile duct with acute cholecystitis, with obstruction   . Melena   . Unstable angina ([place]) 07/12/2018  . Port-A-Cath in place 02/27/2018  . Cancer of overlapping sites of right breast ([place]) 11/27/2017  . Malignant neoplasm of upper-outer quadrant of right breast in female, estrogen receptor positive ([place]) 10/03/2017  . Carpal tunnel syndrome, bilateral 11/26/2016  . 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 Abdominal pain, epigastric   . Acute on chronic renal failure ([place]) 02/19/2015  . Diarrhea   . Nausea with vomiting   . Hyperglycemia 04/01/2014  . Hyperosmolar non-ketotic state in patient with type 2 diabetes mellitus ([place]) 04/01/2014  . Nausea vomiting and diarrhea 04/01/2014  . CAD (coronary artery disease) 04/01/2014  . Dehydration   . Diabetic ketoacidosis without coma associated with diabetes mellitus due to underlying condition ([place])   . High anion gap metabolic acidosis   . Cocaine abuse ([place])   . Toxic encephalopathy-Unlikely metabolic,?psychogenic 08/02/2012  . Bereavement 02/02/2012  . Depression 02/02/2012  . Iron deficiency anemia, unspecified 11/30/2011  . Nonspecific abnormal finding in stool contents 11/30/2011  . Benign neoplasm of colon 11/30/2011  . Cough 05/14/2011  . Chest pain 03/19/2011  . Macrocytic anemia 03/19/2011  . Insulin dependent diabetes mellitus 03/19/2011   . DOE (dyspnea on exertion) 03/19/2011  . Chest pain at rest 02/18/2011    Class: Acute  . Cholelithiasis 11/01/2010  . DM 04/08/2009  . HYPERLIPIDEMIA 04/08/2009  . Essential hypertension 04/08/2009  . MYOCARDIAL INFARCTION 04/08/2009  . Coronary atherosclerosis 04/08/2009  . GERD 04/08/2009  . FATTY LIVER DISEASE 04/08/2009  . COLONIC POLYPS, HX OF 04/08/2009    Consultants Dr. Terrence Dupont, cardiology  Imaging: Dg Chest Port 1 View  Result Date: 11/17/2018 CLINICAL DATA:  NEW ONSET STERNAL PAIN RADIATING INTO BACK HX MI/STENTS 2013, CAD, COPD, RT MASTECTOMY CHRONIC CHOLECYSTITIS ,former smoker EXAM: PORTABLE CHEST 1 VIEW COMPARISON:  07/12/2018 FINDINGS: Power port with tip in the mid SVC. Normal cardiac silhouette. No effusion, infiltrate, or pneumothorax. No acute osseous abnormality. IMPRESSION: No acute cardiopulmonary process. Electronically Signed   By: Suzy Bouchard M.D.   On: 11/17/2018 09:30    Procedures Dr. Ninfa Linden (11/11/18) - Laparoscopic converted to open cholecystectomy with intraoperative cholangiogram, JP drain placement  HPI: The patient is a 68 year old female who presents for evaluation of gall stones. She is here for a follow-up of her most recent hospitalization for cholecystitis. This was severe cholecystitis and she ended up needing an ERCP with clot and sludge removed from her bile duct. Her original CT showed possible gallbladder fistula but this proved to be negative on an MRCP. She was finally discharged home on oral antibiotics. She reports she still has some discomfort in the right upper quadrant and is now finished her course of antibiotics.  Hospital Course:  Patient was underwent procedure listed above and was admitted to the surgery service.  Diet was advanced as  tolerated.  Patient had a drop in hemoglobin POD #1 so she was transfused 2 units of blood.  Hemoglobin was monitored and it stabilized.  She had mildly elevated creatinine which  improved during her admission.  On POD #6, the patient was complaining of chest pain and shortness of breath.  Cardiac work-up was negative.  Patient's cardiologist, Dr. Terrence Dupont, came to see the patient.  Repeat EKG and troponin on POD #7 showed no acute changes.  Patient worked with physical therapy who recommended no follow-up.  On POD#7, the patient was voiding well, tolerating diet, ambulating well, pain well controlled, vital signs stable, incisions c/d/i and felt stable for discharge home.  JP drain with serosanguineous and was removed prior to discharge.  Patient will follow up as outlined below and knows to call with questions or concerns.     Patient was discharged in good condition.  The [place] Substance controlled database was reviewed prior to prescribing narcotic pain medication to this patient.  Physical Exam: General:  Alert, NAD, pleasant, cooperative Cardio: RRR, S1 & S2 normal Resp: Effort normal, lungs CTA bilaterally, no wheezes, rales, rhonchi Abd:  Soft, ND, normal bowel sounds, mild TTP in RUQ, no peritonitis, incisions with staples intact are well appearing and are without signs of infection. drain with minimal serosanguinous drainage  Skin: warm and dry, no rashes noted Extremities: no TTP, swelling, or increased warmth noted to BL calves. No BLE edema.   Allergies as of 11/18/2018   No Known Allergies     Medication List    TAKE these medications   acetaminophen 325 MG tablet Commonly known as: TYLENOL Take 2 tablets (650 mg total) by mouth every 4 (four) hours as needed for headache or mild pain.   albuterol (2.5 MG/3ML) 0.083% nebulizer solution Commonly known as: PROVENTIL INHALE THE CONTENTS OF 1 VIAL USING NEBULIZER EVERY 6 HOURS AS NEEDEDFOR WHEEZING OR SHORTNESS OF BREATH What changed: See the new instructions.   allopurinol 100 MG tablet Commonly known as: ZYLOPRIM Take 100 mg by mouth every evening.   ALPRAZolam 0.25 MG tablet Commonly  known as: XANAX Take 0.25 mg by mouth at bedtime.   amLODipine 5 MG tablet Commonly known as: NORVASC Take 5 mg by mouth daily.   anastrozole 1 MG tablet Commonly known as: ARIMIDEX Take 1 tablet (1 mg total) by mouth daily.   aspirin 81 MG EC tablet Take 1 tablet (81 mg total) by mouth daily.   atorvastatin 20 MG tablet Commonly known as: LIPITOR Take 20 mg by mouth daily at 6 PM.   budesonide-formoterol 160-4.5 MCG/ACT inhaler Commonly known as: SYMBICORT Inhale 1 puff into the lungs 2 (two) times daily as needed (respiratory issues.).   feeding supplement (ENSURE ENLIVE) Liqd Take 237 mLs by mouth 2 (two) times daily between meals.   Fish Oil 1000 MG Caps Take 1,000 mg by mouth daily.   Lantus SoloStar 100 UNIT/ML Solostar Pen Generic drug: Insulin Glargine Inject 10 Units into the skin at bedtime.   loperamide 2 MG capsule Commonly known as: IMODIUM Take 1 capsule (2 mg total) by mouth 4 (four) times daily as needed for diarrhea or loose stools.   magnesium oxide 400 (241.3 Mg) MG tablet Commonly known as: MAG-OX Take 1 tablet (400 mg total) by mouth 2 (two) times daily.   metoprolol succinate 50 MG 24 hr tablet Commonly known as: TOPROL-XL Take 50 mg by mouth daily. Take with or immediately following a meal.   multivitamin with  minerals Tabs tablet Take 1 tablet by mouth daily. [place]   nitroGLYCERIN 0.4 MG SL tablet Commonly known as: NITROSTAT Place 1 tablet (0.4 mg total) under the tongue every 5 (five) minutes as needed. For chest pain What changed:   reasons to take this  additional instructions   ondansetron 8 MG tablet Commonly known as: ZOFRAN Take 1 tablet (8 mg total) by mouth every 8 (eight) hours as needed for nausea or vomiting.   pantoprazole 40 MG tablet Commonly known as: PROTONIX Take 1 tablet (40 mg total) by mouth daily. What changed: when to take this   traMADol 50 MG tablet Commonly known as: ULTRAM Take 1 tablet (50 mg  total) by mouth every 6 (six) hours as needed for moderate pain.        Follow-up Information    Coralie Keens, MD. Go on 12/12/2018.   Specialty: General Surgery Why: 9:10am for your follow up appointment. Please arrive by 9:00am to complete paperwork. Please bring photo ID and insurance card Contact information: [place] [address] [phone]        Tsosie Billing, MD Follow up.   Specialty: Internal Medicine Why: Call for follow up for medical issues and let them know you had surgery. Contact information: [place] [address] [facility], [place]. Go on 11/25/2018.   Specialty: General Surgery Why: at 1:50pm to have your staples removed. Please bring photo ID and insurance card Contact information: [address] [place] [phone]       Charolette Forward, MD. Schedule an appointment as soon as possible for a visit in 2 week(s).   Specialty: Cardiology Why: for follow up Contact information: [address] [place] [address] [phone]           Signed: [facility] 11/18/2018, 9:55 AM Pager: [phone] Consults: [phone] Mon-Fri 7:00 am-4:30 pm Sat-Sun 7:00 am-11:30 am

## 2018-11-18 NOTE — Progress Notes (Signed)
Subjective:  Doing well denies any further chest pain or shortness of breath.  High-sensitivity troponin I's have been negative EKG no acute ischemic changes  Objective:  Vital Signs in the last 24 hours: Temp:  [98.4 F (36.9 C)-98.7 F (37.1 C)] 98.7 F (37.1 C) (10/13 0522) Pulse Rate:  [58-65] 65 (10/13 0522) Resp:  [15-18] 15 (10/13 0522) BP: (119-148)/(61-75) 119/66 (10/13 0522) SpO2:  [97 %-100 %] 97 % (10/13 0522)  Intake/Output from previous day: 10/12 0701 - 10/13 0700 In: 340 [P.O.:340] Out: 2205 [Urine:2200; Drains:5] Intake/Output from this shift: No intake/output data recorded.  Physical Exam: Neck: no adenopathy, no carotid bruit, no JVD and supple, symmetrical, trachea midline Lungs: Clear to auscultation anterolaterally Heart: regular rate and rhythm, S1, S2 normal and Soft systolic murmur noted no S3 gallop Abdomen: Soft mildly distended surgical dressing and staples dry JP bulb minimal fluid noted bowel sounds faint noted Extremities: extremities normal, atraumatic, no cyanosis or edema  Lab Results: Recent Labs    11/17/18 1208  WBC 8.3  HGB 8.6*  PLT 413*   Recent Labs    11/17/18 1208  NA 138  K 3.7  CL 104  CO2 27  GLUCOSE 189*  BUN 13  CREATININE 1.33*   No results for input(s): TROPONINI in the last 72 hours.  Invalid input(s): CK, MB Hepatic Function Panel Recent Labs    11/17/18 1208  PROT 7.0  ALBUMIN 2.5*  AST 38  ALT 47*  ALKPHOS 171*  BILITOT 0.8   No results for input(s): CHOL in the last 72 hours. No results for input(s): PROTIME in the last 72 hours.  Imaging: Imaging results have been reviewed and Dg Chest Port 1 View  Result Date: 11/17/2018 CLINICAL DATA:  NEW ONSET STERNAL PAIN RADIATING INTO BACK HX MI/STENTS 2013, CAD, COPD, RT MASTECTOMY CHRONIC CHOLECYSTITIS ,former smoker EXAM: PORTABLE CHEST 1 VIEW COMPARISON:  07/12/2018 FINDINGS: Power port with tip in the mid SVC. Normal cardiac silhouette. No effusion,  infiltrate, or pneumothorax. No acute osseous abnormality. IMPRESSION: No acute cardiopulmonary process. Electronically Signed   By: Suzy Bouchard M.D.   On: 11/17/2018 09:30    Cardiac Studies:  Assessment/Plan:  Chronic cholecystitis status post open cholecystectomy postop day 7 Status post atypical chest pain Coronary artery disease history of PCI to RCA and left circumflex in the past recently had negative nuclear stress test Hypertension Insulin requiring diabetes mellitus Hyperlipidemia Malignant carcinoma of the right breast status post mastectomy GERD History of bronchial asthma History of colonic polyp Anemia of chronic disease Chronic kidney disease stage III Plan Continue present management per surgery I will sign off please call if needed Okay to discharge from cardiac point of view follow-up with me in 1 to 2 weeks  LOS: 7 days    Charolette Forward 11/18/2018, 9:33 AM

## 2018-11-18 NOTE — Progress Notes (Signed)
Discharged home today. Patient's son driving her home.personal belongings,discharged instructions given. Verbalized understanding of instructions

## 2018-11-24 ENCOUNTER — Other Ambulatory Visit: Payer: Self-pay | Admitting: Hematology and Oncology

## 2018-11-27 ENCOUNTER — Other Ambulatory Visit: Payer: Self-pay

## 2018-11-27 ENCOUNTER — Other Ambulatory Visit: Payer: Medicare Other

## 2018-11-27 ENCOUNTER — Other Ambulatory Visit: Payer: Self-pay | Admitting: Hematology and Oncology

## 2018-11-27 ENCOUNTER — Inpatient Hospital Stay: Payer: Medicare Other

## 2018-11-27 ENCOUNTER — Encounter: Payer: Self-pay | Admitting: Hematology and Oncology

## 2018-11-27 VITALS — BP 127/75 | HR 61 | Temp 98.7°F | Resp 18

## 2018-11-27 DIAGNOSIS — Z79899 Other long term (current) drug therapy: Secondary | ICD-10-CM | POA: Diagnosis not present

## 2018-11-27 DIAGNOSIS — Z170 Estrogen receptor positive status [ER+]: Secondary | ICD-10-CM

## 2018-11-27 DIAGNOSIS — C50411 Malignant neoplasm of upper-outer quadrant of right female breast: Secondary | ICD-10-CM | POA: Diagnosis present

## 2018-11-27 DIAGNOSIS — Z5112 Encounter for antineoplastic immunotherapy: Secondary | ICD-10-CM | POA: Diagnosis present

## 2018-11-27 DIAGNOSIS — D649 Anemia, unspecified: Secondary | ICD-10-CM

## 2018-11-27 LAB — CBC WITH DIFFERENTIAL (CANCER CENTER ONLY)
Abs Immature Granulocytes: 0.03 10*3/uL (ref 0.00–0.07)
Basophils Absolute: 0.1 10*3/uL (ref 0.0–0.1)
Basophils Relative: 1 %
Eosinophils Absolute: 0.4 10*3/uL (ref 0.0–0.5)
Eosinophils Relative: 5 %
HCT: 32.7 % — ABNORMAL LOW (ref 36.0–46.0)
Hemoglobin: 9.8 g/dL — ABNORMAL LOW (ref 12.0–15.0)
Immature Granulocytes: 0 %
Lymphocytes Relative: 31 %
Lymphs Abs: 2.6 10*3/uL (ref 0.7–4.0)
MCH: 27 pg (ref 26.0–34.0)
MCHC: 30 g/dL (ref 30.0–36.0)
MCV: 90.1 fL (ref 80.0–100.0)
Monocytes Absolute: 0.8 10*3/uL (ref 0.1–1.0)
Monocytes Relative: 9 %
Neutro Abs: 4.5 10*3/uL (ref 1.7–7.7)
Neutrophils Relative %: 54 %
Platelet Count: 529 10*3/uL — ABNORMAL HIGH (ref 150–400)
RBC: 3.63 MIL/uL — ABNORMAL LOW (ref 3.87–5.11)
RDW: 17 % — ABNORMAL HIGH (ref 11.5–15.5)
WBC Count: 8.4 10*3/uL (ref 4.0–10.5)
nRBC: 0 % (ref 0.0–0.2)

## 2018-11-27 LAB — CMP (CANCER CENTER ONLY)
ALT: 24 U/L (ref 0–44)
AST: 20 U/L (ref 15–41)
Albumin: 3.5 g/dL (ref 3.5–5.0)
Alkaline Phosphatase: 202 U/L — ABNORMAL HIGH (ref 38–126)
Anion gap: 10 (ref 5–15)
BUN: 18 mg/dL (ref 8–23)
CO2: 27 mmol/L (ref 22–32)
Calcium: 10.6 mg/dL — ABNORMAL HIGH (ref 8.9–10.3)
Chloride: 104 mmol/L (ref 98–111)
Creatinine: 1.91 mg/dL — ABNORMAL HIGH (ref 0.44–1.00)
GFR, Est AFR Am: 30 mL/min — ABNORMAL LOW (ref >60)
GFR, Estimated: 26 mL/min — ABNORMAL LOW (ref >60)
Glucose, Bld: 99 mg/dL (ref 70–99)
Potassium: 4.6 mmol/L (ref 3.5–5.1)
Sodium: 141 mmol/L (ref 135–145)
Total Bilirubin: 0.4 mg/dL (ref 0.3–1.2)
Total Protein: 8.8 g/dL — ABNORMAL HIGH (ref 6.5–8.1)

## 2018-11-27 LAB — IRON AND TIBC
Iron: 34 ug/dL — ABNORMAL LOW (ref 41–142)
Saturation Ratios: 11 % — ABNORMAL LOW (ref 21–57)
TIBC: 318 ug/dL (ref 236–444)
UIBC: 283 ug/dL (ref 120–384)

## 2018-11-27 LAB — VITAMIN B12: Vitamin B-12: 467 pg/mL (ref 180–914)

## 2018-11-27 LAB — SAMPLE TO BLOOD BANK

## 2018-11-27 LAB — FERRITIN: Ferritin: 41 ng/mL (ref 11–307)

## 2018-11-27 LAB — FOLATE: Folate: 36.3 ng/mL (ref >5.9)

## 2018-11-27 MED ORDER — SODIUM CHLORIDE 0.9 % IV SOLN
Freq: Once | INTRAVENOUS | Status: AC
  Administered 2018-11-27: 14:00:00 via INTRAVENOUS
  Filled 2018-11-27: qty 250

## 2018-11-27 MED ORDER — SODIUM CHLORIDE 0.9% FLUSH
10.0000 mL | INTRAVENOUS | Status: DC | PRN
  Administered 2018-11-27: 10 mL
  Filled 2018-11-27: qty 10

## 2018-11-27 MED ORDER — DIPHENHYDRAMINE HCL 25 MG PO CAPS
ORAL_CAPSULE | ORAL | Status: AC
  Filled 2018-11-27: qty 1

## 2018-11-27 MED ORDER — HEPARIN SOD (PORK) LOCK FLUSH 100 UNIT/ML IV SOLN
500.0000 [IU] | Freq: Once | INTRAVENOUS | Status: AC | PRN
  Administered 2018-11-27: 500 [IU]
  Filled 2018-11-27: qty 5

## 2018-11-27 MED ORDER — ACETAMINOPHEN 325 MG PO TABS
ORAL_TABLET | ORAL | Status: AC
  Filled 2018-11-27: qty 2

## 2018-11-27 MED ORDER — ACETAMINOPHEN 325 MG PO TABS
650.0000 mg | ORAL_TABLET | Freq: Once | ORAL | Status: AC
  Administered 2018-11-27: 650 mg via ORAL

## 2018-11-27 MED ORDER — TRASTUZUMAB-ANNS CHEMO 150 MG IV SOLR
6.0000 mg/kg | Freq: Once | INTRAVENOUS | Status: AC
  Administered 2018-11-27: 357 mg via INTRAVENOUS
  Filled 2018-11-27: qty 17

## 2018-11-27 MED ORDER — DIPHENHYDRAMINE HCL 25 MG PO CAPS
50.0000 mg | ORAL_CAPSULE | Freq: Once | ORAL | Status: AC
  Administered 2018-11-27: 50 mg via ORAL

## 2018-11-27 NOTE — Patient Instructions (Signed)
[facility] °Discharge Instructions for Patients Receiving Chemotherapy ° °Today you received the following chemotherapy agents Trastuzumab ° °To help prevent nausea and vomiting after your treatment, we encourage you to take your nausea medication as directed. °  °If you develop nausea and vomiting that is not controlled by your nausea medication, call the clinic.  ° °BELOW ARE SYMPTOMS THAT SHOULD BE REPORTED IMMEDIATELY: °· *FEVER GREATER THAN 100.5 F °· *CHILLS WITH OR WITHOUT FEVER °· NAUSEA AND VOMITING THAT IS NOT CONTROLLED WITH YOUR NAUSEA MEDICATION °· *UNUSUAL SHORTNESS OF BREATH °· *UNUSUAL BRUISING OR BLEEDING °· TENDERNESS IN MOUTH AND THROAT WITH OR WITHOUT PRESENCE OF ULCERS °· *URINARY PROBLEMS °· *BOWEL PROBLEMS °· UNUSUAL RASH °Items with * indicate a potential emergency and should be followed up as soon as possible. ° °Feel free to call the clinic should you have any questions or concerns. The clinic phone number is [phone]. ° °Please show the CHEMO ALERT CARD at check-in to the Emergency Department and triage nurse. ° ° °

## 2018-11-27 NOTE — Progress Notes (Signed)
Per Dr. Lindi Adie ok for pt to receive trastuzumab today. She is s/p cholecystectomy.

## 2018-11-27 NOTE — Patient Instructions (Signed)

## 2018-11-27 NOTE — Progress Notes (Signed)
Met with patient at registration to introduce myself as Financial Resource Specialist and to offer available resources.  Discussed one-time $1000 [employer] grant and qualifications to assist with personal expenses while going through treatment.  She has my card if interested in applying and for any additional financial questions or concerns.   

## 2018-12-02 ENCOUNTER — Encounter: Payer: Self-pay | Admitting: Hematology and Oncology

## 2018-12-02 NOTE — Progress Notes (Signed)
Returned call from voicemail left by patient.  Patient wanted to inquire further about the grant.  Advised her what was needed for proof of income to apply. She will bring at next appointment.  She has my card for any additional financial questions or concerns.

## 2018-12-08 ENCOUNTER — Observation Stay (HOSPITAL_COMMUNITY)
Admission: EM | Admit: 2018-12-08 | Discharge: 2018-12-09 | Disposition: A | Discharge status: Home / Self Care | Payer: Medicare Other | Attending: Internal Medicine | Admitting: Internal Medicine

## 2018-12-08 ENCOUNTER — Other Ambulatory Visit: Payer: Self-pay

## 2018-12-08 ENCOUNTER — Emergency Department (HOSPITAL_COMMUNITY): Payer: Medicare Other

## 2018-12-08 ENCOUNTER — Encounter (HOSPITAL_COMMUNITY): Payer: Self-pay

## 2018-12-08 DIAGNOSIS — F1410 Cocaine abuse, uncomplicated: Secondary | ICD-10-CM | POA: Insufficient documentation

## 2018-12-08 DIAGNOSIS — Z20828 Contact with and (suspected) exposure to other viral communicable diseases: Secondary | ICD-10-CM | POA: Diagnosis not present

## 2018-12-08 DIAGNOSIS — I10 Essential (primary) hypertension: Secondary | ICD-10-CM

## 2018-12-08 DIAGNOSIS — Z853 Personal history of malignant neoplasm of breast: Secondary | ICD-10-CM | POA: Insufficient documentation

## 2018-12-08 DIAGNOSIS — Z794 Long term (current) use of insulin: Secondary | ICD-10-CM | POA: Insufficient documentation

## 2018-12-08 DIAGNOSIS — R0789 Other chest pain: Secondary | ICD-10-CM

## 2018-12-08 DIAGNOSIS — I129 Hypertensive chronic kidney disease with stage 1 through stage 4 chronic kidney disease, or unspecified chronic kidney disease: Secondary | ICD-10-CM | POA: Diagnosis not present

## 2018-12-08 DIAGNOSIS — E1110 Type 2 diabetes mellitus with ketoacidosis without coma: Secondary | ICD-10-CM | POA: Insufficient documentation

## 2018-12-08 DIAGNOSIS — Z833 Family history of diabetes mellitus: Secondary | ICD-10-CM | POA: Insufficient documentation

## 2018-12-08 DIAGNOSIS — N183 Chronic kidney disease, stage 3 unspecified: Secondary | ICD-10-CM | POA: Diagnosis not present

## 2018-12-08 DIAGNOSIS — Z86010 Personal history of colonic polyps: Secondary | ICD-10-CM | POA: Insufficient documentation

## 2018-12-08 DIAGNOSIS — M1990 Unspecified osteoarthritis, unspecified site: Secondary | ICD-10-CM | POA: Insufficient documentation

## 2018-12-08 DIAGNOSIS — E119 Type 2 diabetes mellitus without complications: Secondary | ICD-10-CM

## 2018-12-08 DIAGNOSIS — N184 Chronic kidney disease, stage 4 (severe): Secondary | ICD-10-CM | POA: Diagnosis present

## 2018-12-08 DIAGNOSIS — I252 Old myocardial infarction: Secondary | ICD-10-CM | POA: Diagnosis not present

## 2018-12-08 DIAGNOSIS — Z79899 Other long term (current) drug therapy: Secondary | ICD-10-CM | POA: Insufficient documentation

## 2018-12-08 DIAGNOSIS — J449 Chronic obstructive pulmonary disease, unspecified: Secondary | ICD-10-CM | POA: Diagnosis not present

## 2018-12-08 DIAGNOSIS — R0602 Shortness of breath: Secondary | ICD-10-CM

## 2018-12-08 DIAGNOSIS — Z7951 Long term (current) use of inhaled steroids: Secondary | ICD-10-CM | POA: Insufficient documentation

## 2018-12-08 DIAGNOSIS — Z955 Presence of coronary angioplasty implant and graft: Secondary | ICD-10-CM | POA: Insufficient documentation

## 2018-12-08 DIAGNOSIS — D509 Iron deficiency anemia, unspecified: Secondary | ICD-10-CM | POA: Insufficient documentation

## 2018-12-08 DIAGNOSIS — Z9011 Acquired absence of right breast and nipple: Secondary | ICD-10-CM | POA: Insufficient documentation

## 2018-12-08 DIAGNOSIS — I25110 Atherosclerotic heart disease of native coronary artery with unstable angina pectoris: Secondary | ICD-10-CM | POA: Diagnosis not present

## 2018-12-08 DIAGNOSIS — K760 Fatty (change of) liver, not elsewhere classified: Secondary | ICD-10-CM | POA: Insufficient documentation

## 2018-12-08 DIAGNOSIS — I200 Unstable angina: Secondary | ICD-10-CM | POA: Diagnosis present

## 2018-12-08 DIAGNOSIS — Z7982 Long term (current) use of aspirin: Secondary | ICD-10-CM | POA: Diagnosis not present

## 2018-12-08 DIAGNOSIS — E785 Hyperlipidemia, unspecified: Secondary | ICD-10-CM | POA: Insufficient documentation

## 2018-12-08 DIAGNOSIS — I700 Atherosclerosis of aorta: Secondary | ICD-10-CM | POA: Diagnosis not present

## 2018-12-08 DIAGNOSIS — K8010 Calculus of gallbladder with chronic cholecystitis without obstruction: Secondary | ICD-10-CM | POA: Insufficient documentation

## 2018-12-08 DIAGNOSIS — E1122 Type 2 diabetes mellitus with diabetic chronic kidney disease: Secondary | ICD-10-CM | POA: Diagnosis not present

## 2018-12-08 DIAGNOSIS — F329 Major depressive disorder, single episode, unspecified: Secondary | ICD-10-CM | POA: Insufficient documentation

## 2018-12-08 DIAGNOSIS — Z9049 Acquired absence of other specified parts of digestive tract: Secondary | ICD-10-CM | POA: Diagnosis not present

## 2018-12-08 DIAGNOSIS — K219 Gastro-esophageal reflux disease without esophagitis: Secondary | ICD-10-CM | POA: Insufficient documentation

## 2018-12-08 DIAGNOSIS — R1310 Dysphagia, unspecified: Secondary | ICD-10-CM | POA: Insufficient documentation

## 2018-12-08 DIAGNOSIS — J441 Chronic obstructive pulmonary disease with (acute) exacerbation: Secondary | ICD-10-CM

## 2018-12-08 DIAGNOSIS — N1832 Chronic kidney disease, stage 3b: Secondary | ICD-10-CM | POA: Diagnosis present

## 2018-12-08 DIAGNOSIS — Z809 Family history of malignant neoplasm, unspecified: Secondary | ICD-10-CM | POA: Insufficient documentation

## 2018-12-08 DIAGNOSIS — Z8249 Family history of ischemic heart disease and other diseases of the circulatory system: Secondary | ICD-10-CM | POA: Insufficient documentation

## 2018-12-08 LAB — BASIC METABOLIC PANEL
Anion gap: 11 (ref 5–15)
BUN: 20 mg/dL (ref 8–23)
CO2: 23 mmol/L (ref 22–32)
Calcium: 10 mg/dL (ref 8.9–10.3)
Chloride: 106 mmol/L (ref 98–111)
Creatinine, Ser: 1.8 mg/dL — ABNORMAL HIGH (ref 0.44–1.00)
GFR calc Af Amer: 32 mL/min — ABNORMAL LOW (ref >60)
GFR calc non Af Amer: 28 mL/min — ABNORMAL LOW (ref >60)
Glucose, Bld: 109 mg/dL — ABNORMAL HIGH (ref 70–99)
Potassium: 4.4 mmol/L (ref 3.5–5.1)
Sodium: 140 mmol/L (ref 135–145)

## 2018-12-08 LAB — CBC
HCT: 39.2 % (ref 36.0–46.0)
Hemoglobin: 11.6 g/dL — ABNORMAL LOW (ref 12.0–15.0)
MCH: 27.6 pg (ref 26.0–34.0)
MCHC: 29.6 g/dL — ABNORMAL LOW (ref 30.0–36.0)
MCV: 93.1 fL (ref 80.0–100.0)
Platelets: 430 10*3/uL — ABNORMAL HIGH (ref 150–400)
RBC: 4.21 MIL/uL (ref 3.87–5.11)
RDW: 17.9 % — ABNORMAL HIGH (ref 11.5–15.5)
WBC: 9.8 10*3/uL (ref 4.0–10.5)
nRBC: 0 % (ref 0.0–0.2)

## 2018-12-08 LAB — TROPONIN I (HIGH SENSITIVITY): Troponin I (High Sensitivity): 6 ng/L (ref <18)

## 2018-12-08 MED ORDER — SODIUM CHLORIDE 0.9% FLUSH: 3.0000 mL | Freq: Once | INTRAVENOUS | Status: DC

## 2018-12-08 NOTE — ED Triage Notes (Signed)
Pt bib ems for chest pain that started around 730pm, hx of MI. Pain 5/10, pain radiated to her left arm and back. Pain now 2/10, took her own Nitro and 324 ASA prior to EMS arrival. Denies any other symptoms   BP 148/72 HR 80s 100% room air  rr 18 97.8 temp.  Pt a.o, tearful in triage.

## 2018-12-09 ENCOUNTER — Encounter (HOSPITAL_COMMUNITY): Payer: Self-pay

## 2018-12-09 ENCOUNTER — Observation Stay (HOSPITAL_COMMUNITY): Payer: Medicare Other

## 2018-12-09 ENCOUNTER — Other Ambulatory Visit: Payer: Self-pay

## 2018-12-09 DIAGNOSIS — N183 Chronic kidney disease, stage 3 unspecified: Secondary | ICD-10-CM | POA: Diagnosis not present

## 2018-12-09 DIAGNOSIS — J449 Chronic obstructive pulmonary disease, unspecified: Secondary | ICD-10-CM

## 2018-12-09 DIAGNOSIS — I200 Unstable angina: Secondary | ICD-10-CM | POA: Diagnosis not present

## 2018-12-09 DIAGNOSIS — I129 Hypertensive chronic kidney disease with stage 1 through stage 4 chronic kidney disease, or unspecified chronic kidney disease: Secondary | ICD-10-CM | POA: Diagnosis not present

## 2018-12-09 DIAGNOSIS — E119 Type 2 diabetes mellitus without complications: Secondary | ICD-10-CM

## 2018-12-09 DIAGNOSIS — J441 Chronic obstructive pulmonary disease with (acute) exacerbation: Secondary | ICD-10-CM

## 2018-12-09 DIAGNOSIS — E1122 Type 2 diabetes mellitus with diabetic chronic kidney disease: Secondary | ICD-10-CM | POA: Diagnosis not present

## 2018-12-09 DIAGNOSIS — I25110 Atherosclerotic heart disease of native coronary artery with unstable angina pectoris: Secondary | ICD-10-CM | POA: Diagnosis not present

## 2018-12-09 LAB — BASIC METABOLIC PANEL
Anion gap: 10 (ref 5–15)
BUN: 17 mg/dL (ref 8–23)
CO2: 22 mmol/L (ref 22–32)
Calcium: 9.7 mg/dL (ref 8.9–10.3)
Chloride: 109 mmol/L (ref 98–111)
Creatinine, Ser: 1.61 mg/dL — ABNORMAL HIGH (ref 0.44–1.00)
GFR calc Af Amer: 37 mL/min — ABNORMAL LOW (ref >60)
GFR calc non Af Amer: 32 mL/min — ABNORMAL LOW (ref >60)
Glucose, Bld: 105 mg/dL — ABNORMAL HIGH (ref 70–99)
Potassium: 4 mmol/L (ref 3.5–5.1)
Sodium: 141 mmol/L (ref 135–145)

## 2018-12-09 LAB — SARS CORONAVIRUS 2 (TAT 6-24 HRS): SARS Coronavirus 2: NEGATIVE

## 2018-12-09 LAB — GLUCOSE, CAPILLARY
Glucose-Capillary: 97 mg/dL (ref 70–99)
Glucose-Capillary: 108 mg/dL — ABNORMAL HIGH (ref 70–99)
Glucose-Capillary: 95 mg/dL (ref 70–99)
Glucose-Capillary: 183 mg/dL — ABNORMAL HIGH (ref 70–99)

## 2018-12-09 LAB — HEPARIN LEVEL (UNFRACTIONATED)
Heparin Unfractionated: 0.32 IU/mL (ref 0.30–0.70)
Heparin Unfractionated: 0.72 IU/mL — ABNORMAL HIGH (ref 0.30–0.70)

## 2018-12-09 LAB — TROPONIN I (HIGH SENSITIVITY)
Troponin I (High Sensitivity): 7 ng/L (ref <18)
Troponin I (High Sensitivity): 7 ng/L (ref <18)

## 2018-12-09 LAB — HEMOGLOBIN A1C
Hgb A1c MFr Bld: 6 % — ABNORMAL HIGH (ref 4.8–5.6)
Mean Plasma Glucose: 125.5 mg/dL

## 2018-12-09 LAB — RAPID URINE DRUG SCREEN, HOSP PERFORMED
Amphetamines: NOT DETECTED
Barbiturates: NOT DETECTED
Benzodiazepines: POSITIVE — AB
Cocaine: POSITIVE — AB
Opiates: NOT DETECTED
Tetrahydrocannabinol: NOT DETECTED

## 2018-12-09 LAB — CBG MONITORING, ED: Glucose-Capillary: 104 mg/dL — ABNORMAL HIGH (ref 70–99)

## 2018-12-09 MED ORDER — HEPARIN SODIUM (PORCINE) 5000 UNIT/ML IJ SOLN: 5000.0000 [IU] | Freq: Three times a day (TID) | INTRAMUSCULAR | Status: DC

## 2018-12-09 MED ORDER — HEPARIN SOD (PORK) LOCK FLUSH 100 UNIT/ML IV SOLN
500.0000 [IU] | INTRAVENOUS | Status: AC | PRN
  Administered 2018-12-09: 500 [IU]
  Filled 2018-12-09: qty 5

## 2018-12-09 MED ORDER — HEPARIN (PORCINE) 25000 UT/250ML-% IV SOLN
700.0000 [IU]/h | INTRAVENOUS | Status: DC
  Administered 2018-12-09: 01:00:00 700 [IU]/h via INTRAVENOUS
  Filled 2018-12-09: qty 250

## 2018-12-09 MED ORDER — IPRATROPIUM-ALBUTEROL 0.5-2.5 (3) MG/3ML IN SOLN: 3.0000 mL | Freq: Four times a day (QID) | RESPIRATORY_TRACT | Status: DC | PRN

## 2018-12-09 MED ORDER — CHLORHEXIDINE GLUCONATE CLOTH 2 % EX PADS
6.0000 | MEDICATED_PAD | Freq: Every day | CUTANEOUS | Status: DC
  Administered 2018-12-09: 11:00:00 6 via TOPICAL

## 2018-12-09 MED ORDER — ACETAMINOPHEN 325 MG PO TABS: 650.0000 mg | ORAL_TABLET | ORAL | Status: DC | PRN

## 2018-12-09 MED ORDER — NITROGLYCERIN 0.4 MG SL SUBL
0.4000 mg | SUBLINGUAL_TABLET | SUBLINGUAL | Status: DC | PRN
  Administered 2018-12-09: 0.4 mg via SUBLINGUAL
  Filled 2018-12-09: qty 1

## 2018-12-09 MED ORDER — PANTOPRAZOLE SODIUM 40 MG PO TBEC
40.0000 mg | DELAYED_RELEASE_TABLET | Freq: Every day | ORAL | Status: DC
  Administered 2018-12-09: 40 mg via ORAL
  Filled 2018-12-09: qty 1

## 2018-12-09 MED ORDER — TECHNETIUM TO 99M ALBUMIN AGGREGATED
1.5500 | Freq: Once | INTRAVENOUS | Status: AC | PRN
  Administered 2018-12-09: 1.55 via INTRAVENOUS

## 2018-12-09 MED ORDER — HEPARIN BOLUS VIA INFUSION
2500.0000 [IU] | Freq: Once | INTRAVENOUS | Status: AC
  Administered 2018-12-09: 01:00:00 2500 [IU] via INTRAVENOUS
  Filled 2018-12-09: qty 2500

## 2018-12-09 MED ORDER — INSULIN ASPART 100 UNIT/ML ~~LOC~~ SOLN
0.0000 [IU] | SUBCUTANEOUS | Status: DC
  Administered 2018-12-09: 18:00:00 2 [IU] via SUBCUTANEOUS

## 2018-12-09 MED ORDER — LORAZEPAM 2 MG/ML IJ SOLN
1.0000 mg | Freq: Once | INTRAMUSCULAR | Status: AC
  Administered 2018-12-09: 1 mg via INTRAVENOUS
  Filled 2018-12-09: qty 1

## 2018-12-09 MED ORDER — LIDOCAINE 5 % EX PTCH: 1.0000 | MEDICATED_PATCH | CUTANEOUS | Status: DC

## 2018-12-09 MED ORDER — SODIUM CHLORIDE 0.9% FLUSH: 10.0000 mL | Freq: Two times a day (BID) | INTRAVENOUS | Status: DC

## 2018-12-09 MED ORDER — LIDOCAINE 5 % EX PTCH
1.0000 | MEDICATED_PATCH | CUTANEOUS | Status: DC
  Administered 2018-12-09: 1 via TRANSDERMAL
  Filled 2018-12-09: qty 1

## 2018-12-09 MED ORDER — SODIUM CHLORIDE 0.9% FLUSH: 10.0000 mL | INTRAVENOUS | Status: DC | PRN

## 2018-12-09 NOTE — ED Provider Notes (Signed)
[facility] EMERGENCY DEPARTMENT Provider Note   CSN: [id number] Arrival date & time: 12/08/18  2038     History   Chief Complaint Chief Complaint  Patient presents with  . Chest Pain    HPI [name] is a 68 y.o. female.     68 y.o female with a PMH of DM, Asthma, COPD, HTN, CKD III, MI (stents placed) presents to the ED via EMS with a chief complaint of chest pain x 5 hours. Patient reports she was washing clothes on the dishwasher when she suddenly began experiencing pressure to the center of her chest with radiation to the left arm and her back. No exacerbating factors. She was given nitro along with ASA which she reports her pain went from 8/10 to a 3/10. Patient also had a cholecystectomy by Dr. Ninfa Linden last month. She also endorses shortness of breath but states this has not changed from her baseline. She is currently receiving chemotherapy for her neoplasm of the right breast after having a right mastectomy.  She does not have a prior history of blood clots.  Denies any dizziness, headache, fevers, cough or abdominal pain.  The history is provided by the patient and medical records.    Past Medical History:  Diagnosis Date  . Ambulates with cane    straight  . Angina    no current problems per patient at PAT appt 11/05/18  . Arthritis    HANDS"  . Asthma   . Cancer ([place]) 09/04/2017   Right Breast  . Carpal tunnel syndrome, bilateral 11/26/2016  . Cholelithiasis   . Cocaine abuse ([place]) 07/2012   per E.R. drug screen  . COPD (chronic obstructive pulmonary disease) ([place])    per patient  . Coronary artery disease   . Diabetes mellitus without mention of complication    type 2  . Dysphagia    esophageal dysmotility on 03/2011 esophagram   . Dyspnea    with exertion  . Fatty liver disease, nonalcoholic 5003   on Imaging.   Marland Kitchen GERD (gastroesophageal reflux disease)   . Headache   . Hearing loss    bilateral - no hearing aids  . History of  colonic polyps 2009   adenomatous 2009, HP in 2009, 2011, 2013.   Marland Kitchen Hyperlipidemia   . Hypertension   . Iron deficiency anemia   . Myocardial infarction ([place])    years ago, 6 stents placed  . 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 Wears dentures    full upper and partial lower  . Wears glasses     Patient Active Problem List   Diagnosis Date Noted  . Chronic cholecystitis 11/11/2018  . Hemobilia   . Dilated bile duct   . Jaundice   . Elevated LFTs   . Choledocholithiasis 10/02/2018  . CKD (chronic kidney disease), stage III   . Acute cholecystitis   . Elevated liver enzymes 09/18/2018  . Calculus of gallbladder and bile duct with acute cholecystitis, with obstruction   . Melena   . Unstable angina ([place]) 07/12/2018  . Port-A-Cath in place 02/27/2018  . Cancer of overlapping sites of right breast ([place]) 11/27/2017  . Malignant neoplasm of upper-outer quadrant of right breast in female, estrogen receptor positive ([place]) 10/03/2017  . Carpal tunnel syndrome, bilateral 11/26/2016  . 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 Abdominal pain, epigastric   . Acute on chronic renal failure ([place]) 02/19/2015  . Diarrhea   . Nausea with  vomiting   . Hyperglycemia 04/01/2014  . Hyperosmolar non-ketotic state in patient with type 2 diabetes mellitus ([place]) 04/01/2014  . Nausea vomiting and diarrhea 04/01/2014  . CAD (coronary artery disease) 04/01/2014  . Dehydration   . Diabetic ketoacidosis without coma associated with diabetes mellitus due to underlying condition ([place])   . High anion gap metabolic acidosis   . Cocaine abuse ([place])   . Toxic encephalopathy-Unlikely metabolic,?psychogenic 08/02/2012  . Bereavement 02/02/2012  . Depression 02/02/2012  . Iron deficiency anemia, unspecified 11/30/2011  . Nonspecific abnormal finding in stool contents 11/30/2011  . Benign neoplasm of colon 11/30/2011  . Cough 05/14/2011  . Chest pain 03/19/2011  . Macrocytic anemia  03/19/2011  . Insulin dependent diabetes mellitus 03/19/2011  . DOE (dyspnea on exertion) 03/19/2011  . Chest pain at rest 02/18/2011    Class: Acute  . Cholelithiasis 11/01/2010  . DM 04/08/2009  . HYPERLIPIDEMIA 04/08/2009  . Essential hypertension 04/08/2009  . MYOCARDIAL INFARCTION 04/08/2009  . Coronary atherosclerosis 04/08/2009  . GERD 04/08/2009  . FATTY LIVER DISEASE 04/08/2009  . COLONIC POLYPS, HX OF 04/08/2009    Past Surgical History:  Procedure Laterality Date  . ABDOMINAL ANGIOGRAM  02/20/2011   Procedure: ABDOMINAL ANGIOGRAM;  Surgeon: Clent Demark, MD;  Location: [facility] CATH LAB;  Service: Cardiovascular;;  . CARPAL TUNNEL RELEASE Right 07/04/2017   Procedure: RIGHT CARPAL TUNNEL RELEASE;  Surgeon: Leanora Cover, MD;  Location: [place];  Service: Orthopedics;  Laterality: Right;  . CARPAL TUNNEL RELEASE Left 09/12/2017   Procedure: LEFT CARPAL TUNNEL RELEASE;  Surgeon: Leanora Cover, MD;  Location: [place];  Service: Orthopedics;  Laterality: Left;  . CHOLECYSTECTOMY N/A 11/11/2018   Procedure: ATTEMPTED LAPAROSCOPIC CHOLECUSTECOMY,  OPEN CHOLECYSTECTOMY;  Surgeon: Coralie Keens, MD;  Location: [place];  Service: General;  Laterality: N/A;  . COLONOSCOPY  11/30/2011   Procedure: COLONOSCOPY;  Surgeon: Lafayette Dragon, MD;  Location: [facility] ENDOSCOPY;  Service: Endoscopy;  Laterality: N/A;  . CORONARY ANGIOPLASTY WITH STENT PLACEMENT  02/20/2011  . ERCP N/A 10/03/2018   Procedure: ENDOSCOPIC RETROGRADE CHOLANGIOPANCREATOGRAPHY (ERCP);  Surgeon: Ladene Artist, MD;  Location: [facility];  Service: Endoscopy;  Laterality: N/A;  . ESOPHAGOGASTRODUODENOSCOPY  11/30/2011   Procedure: ESOPHAGOGASTRODUODENOSCOPY (EGD);  Surgeon: Lafayette Dragon, MD;  Location: [facility]OSCOPY;  Service: Endoscopy;  Laterality: N/A;  . ESOPHAGOGASTRODUODENOSCOPY N/A 02/23/2015   Procedure: ESOPHAGOGASTRODUODENOSCOPY (EGD);  Surgeon: Gatha Mayer, MD;  Location: [facility]  ENDOSCOPY;  Service: Endoscopy;  Laterality: N/A;  . EYE SURGERY Bilateral    cataracts removed  . INTRAOPERATIVE CHOLANGIOGRAM N/A 11/11/2018   Procedure: Intraoperative Cholangiogram;  Surgeon: Coralie Keens, MD;  Location: [place];  Service: General;  Laterality: N/A;  . LEFT HEART CATHETERIZATION WITH CORONARY ANGIOGRAM N/A 02/20/2011   Procedure: LEFT HEART CATHETERIZATION WITH CORONARY ANGIOGRAM;  Surgeon: Clent Demark, MD;  Location: [facility] CATH LAB;  Service: Cardiovascular;  Laterality: N/A;  . MASTECTOMY Right 11/05/2017  . MASTECTOMY W/ SENTINEL NODE BIOPSY Right 11/27/2017  . MASTECTOMY W/ SENTINEL NODE BIOPSY Right 11/27/2017   Procedure: RIGHT TOTAL MASTECTOMY WITH SENTINEL LYMPH NODE BIOPSY;  Surgeon: Excell Seltzer, MD;  Location: [place];  Service: General;  Laterality: Right;  . PORT A CATH REVISION Left 05/07/2018   Procedure: PORT A CATH REVISION;  Surgeon: Excell Seltzer, MD;  Location: [facility] ORS;  Service: General;  Laterality: Left;  . PORTACATH PLACEMENT Left 11/27/2017   Procedure: INSERTION PORT-A-CATH;  Surgeon: Excell Seltzer, MD;  Location:  [facility] OR;  Service: General;  Laterality: Left;  . RIGHT COLECTOMY  02/2007   for post polypectomy colonic perforation  . SPHINCTEROTOMY  10/03/2018   Procedure: SPHINCTEROTOMY;  Surgeon: Ladene Artist, MD;  Location: [facility];  Service: Endoscopy;;  . TUBAL LIGATION       OB History   No obstetric history on file.      Home Medications    Prior to Admission medications   Medication Sig Start Date End Date Taking? Authorizing Provider  acetaminophen (TYLENOL) 325 MG tablet Take 2 tablets (650 mg total) by mouth every 4 (four) hours as needed for headache or mild pain. 07/14/18   Charolette Forward, MD  albuterol (PROVENTIL) (2.5 MG/3ML) 0.083% nebulizer solution INHALE THE CONTENTS OF 1 VIAL USING NEBULIZER EVERY 6 HOURS AS NEEDEDFOR WHEEZING OR SHORTNESS OF BREATH 11/25/18   Nicholas Lose, MD  allopurinol (ZYLOPRIM)  100 MG tablet Take 100 mg by mouth every evening.  09/17/18   [provider]  ALPRAZolam Duanne Moron) 0.25 MG tablet Take 0.25 mg by mouth at bedtime.     [provider]  amLODipine (NORVASC) 5 MG tablet Take 5 mg by mouth daily.    [provider]  anastrozole (ARIMIDEX) 1 MG tablet Take 1 tablet (1 mg total) by mouth daily. 02/27/18   Nicholas Lose, MD  aspirin 81 MG EC tablet Take 1 tablet (81 mg total) by mouth daily. 10/13/18   Pokhrel, Corrie Mckusick, MD  atorvastatin (LIPITOR) 20 MG tablet Take 20 mg by mouth daily at 6 PM.     [provider]  budesonide-formoterol (SYMBICORT) 160-4.5 MCG/ACT inhaler Inhale 1 puff into the lungs 2 (two) times daily as needed (respiratory issues.).     [provider]  feeding supplement, ENSURE ENLIVE, (ENSURE ENLIVE) LIQD Take 237 mLs by mouth 2 (two) times daily between meals. 02/24/15   Ghimire, Henreitta Leber, MD  LANTUS SOLOSTAR 100 UNIT/ML Solostar Pen Inject 10 Units into the skin at bedtime.  07/24/18   [provider]  loperamide (IMODIUM) 2 MG capsule Take 1 capsule (2 mg total) by mouth 4 (four) times daily as needed for diarrhea or loose stools. 02/27/15   Marella Chimes, PA-C  magnesium oxide (MAG-OX) 400 (241.3 Mg) MG tablet Take 1 tablet (400 mg total) by mouth 2 (two) times daily. Patient not taking: Reported on 10/31/2018 10/07/18   Flora Lipps, MD  metoprolol succinate (TOPROL-XL) 50 MG 24 hr tablet Take 50 mg by mouth daily. Take with or immediately following a meal.    [provider]  Multiple Vitamin (MULTIVITAMIN WITH MINERALS) TABS tablet Take 1 tablet by mouth daily. [place]    [provider]  nitroGLYCERIN (NITROSTAT) 0.4 MG SL tablet Place 1 tablet (0.4 mg total) under the tongue every 5 (five) minutes as needed. For chest pain Patient taking differently: Place 0.4 mg under the tongue every 5 (five) minutes as needed for chest pain.  02/06/12   Patrecia Pour, NP  Omega-3 Fatty  Acids (FISH OIL) 1000 MG CAPS Take 1,000 mg by mouth daily.    [provider]  ondansetron (ZOFRAN) 8 MG tablet Take 1 tablet (8 mg total) by mouth every 8 (eight) hours as needed for nausea or vomiting. 06/16/18   Nicholas Lose, MD  pantoprazole (PROTONIX) 40 MG tablet Take 1 tablet (40 mg total) by mouth daily. Patient taking differently: Take 40 mg by mouth at bedtime.  10/08/18 11/07/18  Pokhrel, Corrie Mckusick, MD  traMADol Veatrice Bourbon)  50 MG tablet Take 1 tablet (50 mg total) by mouth every 6 (six) hours as needed for moderate pain. 11/18/18   Kalman Drape, PA    Family History Family History  Problem Relation Age of Onset  . Heart disease Mother   . Diabetes Mother   . Cancer Father        unsure what kind  . Diabetes Sister   . Colon cancer Neg Hx   . Breast cancer Neg Hx     Social History Social History   Tobacco Use  . Smoking status: Former Smoker    Packs/day: 0.50    Years: 47.00    Pack years: 23.50    Types: Cigarettes    Quit date: 05/09/2010    Years since quitting: 8.5  . Smokeless tobacco: Never Used  Substance Use Topics  . Alcohol use: No  . Drug use: Not Currently    Types: Cocaine    Comment: positive drug screen (cocaine, benzo's)07/2012 in emergency room     Allergies   Patient has no known allergies.   Review of Systems Review of Systems  Constitutional: Negative for chills and fever.  HENT: Negative for ear pain and sore throat.   Eyes: Negative for pain and visual disturbance.  Respiratory: Positive for shortness of breath. Negative for cough.   Cardiovascular: Positive for chest pain. Negative for palpitations and leg swelling.  Gastrointestinal: Negative for abdominal pain, constipation, diarrhea, nausea and vomiting.  Genitourinary: Negative for dysuria, flank pain and hematuria.  Musculoskeletal: Negative for arthralgias and back pain.  Skin: Negative for color change and rash.  Neurological: Negative for seizures, syncope and  headaches.  All other systems reviewed and are negative.    Physical Exam Updated Vital Signs BP (!) 147/77 (BP Location: Left Arm)   Pulse 80   Temp 98.3 F (36.8 C) (Oral)   Resp 17   LMP  (LMP Unknown) Comment: tubal ligation  SpO2 100%   Physical Exam Vitals signs and nursing note reviewed.  Constitutional:      Appearance: She is well-developed. She is ill-appearing (chornically).  HENT:     Head: Normocephalic and atraumatic.  Eyes:     Pupils: Pupils are equal, round, and reactive to light.  Cardiovascular:     Rate and Rhythm: Normal rate.     Heart sounds: Normal heart sounds.  Pulmonary:     Effort: No tachypnea or respiratory distress.     Breath sounds: No decreased breath sounds or wheezing.  Chest:     Chest wall: Tenderness present.     Comments: Port on left chest.  Reports tenderness to her mastectomy site on the right. Musculoskeletal:     Right lower leg: She exhibits no tenderness and no swelling.     Left lower leg: She exhibits no tenderness and no swelling.  Neurological:     Mental Status: She is alert.      ED Treatments / Results  Labs (all labs ordered are listed, but only abnormal results are displayed) Labs Reviewed  BASIC METABOLIC PANEL - Abnormal; Notable for the following components:      Result Value   Glucose, Bld 109 (*)    Creatinine, Ser 1.80 (*)    GFR calc non Af Amer 28 (*)    GFR calc Af Amer 32 (*)    All other components within normal limits  CBC - Abnormal; Notable for the following components:   Hemoglobin 11.6 (*)  MCHC 29.6 (*)    RDW 17.9 (*)    Platelets 430 (*)    All other components within normal limits  SARS CORONAVIRUS 2 (TAT 6-24 HRS)  HEPARIN LEVEL (UNFRACTIONATED)  TROPONIN I (HIGH SENSITIVITY)  TROPONIN I (HIGH SENSITIVITY)    EKG EKG Interpretation  Date/Time:  Monday December 08 2018 20:48:06 EST Ventricular Rate:  85 PR Interval:  138 QRS Duration: 82 QT Interval:  368 QTC  Calculation: 437 R Axis:   -55 Text Interpretation: Normal sinus rhythm Left axis deviation Minimal voltage criteria for LVH, may be normal variant ( Cornell product ) Abnormal ECG Confirmed by Thayer Jew [phone]) on 12/09/2018 12:12:27 AM   Radiology Dg Chest 2 View  Result Date: 12/08/2018 CLINICAL DATA:  Central chest pain, shortness of breath EXAM: CHEST - 2 VIEW COMPARISON:  11/17/2018 FINDINGS: Left Port-A-Cath remains in place, unchanged. Aortic atherosclerosis. Heart is normal size. Linear scarring at the left base. Right lung clear. No effusions or acute bony abnormality. IMPRESSION: No active cardiopulmonary disease. Electronically Signed   By: Rolm Baptise M.D.   On: 12/08/2018 21:04    Procedures Procedures (including critical care time)  Medications Ordered in ED Medications  sodium chloride flush (NS) 0.9 % injection 3 mL (3 mLs Intravenous Not Given 12/09/18 0057)  heparin ADULT infusion 100 units/mL (25000 units/278mL sodium chloride 0.45%) (has no administration in time range)  heparin bolus via infusion 2,500 Units (has no administration in time range)     Initial Impression / Assessment and Plan / ED Course  I have reviewed the triage vital signs and the nursing notes.  Pertinent labs & imaging results that were available during my care of the patient were reviewed by me and considered in my medical decision making (see chart for details).  Patient with a past medical history of CKD, hypertension, MI with 6 stents placed about 4 years ago presents to the ED with sudden onset of chest pain while washing close today.  She was given aspirin along with nitro by EMS, reports her pain went from an 8 to a 3.  She did recently have surgery by Dr. Ninfa Linden, had a cholecystectomy about 30 days, states she was screened by him without any abdominal pain, fevers or other sources of infection.  She also endorses shortness of breath, reports this is not changed or worsened than  her baseline.  Heart score is a 5  She does have a prior history of CAD, first troponin was 7, although within normal limits she does have significant comorbidities and risk factors.  We will begin heparin in place call for admission to hospitalist for chest pain observation. EKG showed no changes consistent with infarct or STEMI.   She is currently receiving chemo for her right breast neoplasm her CBC does not show any neutropenia.  Hemoglobin slightly decreased but within her baseline.  BMP without any electrolyte abnormality aside from a creatinine level of 1.8 which is within her baseline.  Patient recently had surgery, has shortness of breath along with pleuritic chest pain I feel that she will benefit from a CTA however due to her renal function and GFR will need to obtain VQ scan.  Will have patient have VQ scan while in the hospital.  1:16 AM Spoke to hospitalist Dr. Marlowe Sax who will admit patient for further management.    Portions of this note were generated with [employer]. Dictation errors may occur despite best attempts at proofreading.   Final Clinical  Impressions(s) / ED Diagnoses   Final diagnoses:  Atypical chest pain  Shortness of breath    ED Discharge Orders    None       Janeece Fitting, PA-C 12/09/18 0118    Merryl Hacker, MD 12/10/18 [phone]

## 2018-12-09 NOTE — Progress Notes (Addendum)
ANTICOAGULATION CONSULT NOTE - Follow Up Consult  Pharmacy Consult for heparin Indication: chest pain/ACS  No Known Allergies  Patient Measurements: Height: 4\' 11"  (149.9 cm) Weight: 125 lb 12.8 oz (57.1 kg)(scale a) IBW/kg (Calculated) : 43.2 Heparin Dosing Weight: 58kg  Vital Signs: Temp: 98.8 F (37.1 C) (11/03 0729) Temp Source: Oral (11/03 0729) BP: 116/81 (11/03 0940) Pulse Rate: 89 (11/03 0940)  Labs: Recent Labs    12/08/18 2045 12/08/18 2333 12/09/18 0316 12/09/18 0724  HGB 11.6*  --   --   --   HCT 39.2  --   --   --   PLT 430*  --   --   --   HEPARINUNFRC  --   --   --  0.32  CREATININE 1.80*  --  1.61*  --   TROPONINIHS 6 7  --   --    Estimated Creatinine Clearance: 25 mL/min (A) (by C-G formula based on SCr of 1.61 mg/dL (H)).  Assessment: 68 yo W on heparin for chest pain.  She has h/o MI, CAD s/p PCI. She was not on anticoagulation PTA. Received bolus in ED, first HL at goal. Continues to have chest discomfort with walking, no signs of bleeding.   Goal of Therapy:  Heparin level 0.3-0.7 units/ml Monitor platelets by anticoagulation protocol: Yes  Plan:  -Continue heparin 700 units/hr -Monitor 6hr HL, daily CBC, plt -Monitor for signs/symptoms of bleeding  ---------------------------------------------------------------------------------------------  ADDENDUM: Repeat heparin level drawn slightly early at 5hr from last level and is significantly higher than previous. However, heparin infusion order discontinued by MD, starting subcutaneous heparin.   Recent Labs    12/09/18 1224  HEPARINUNFRC 0.72*   Plan:  Stop heparin infusion per MD   Benetta Spar, PharmD, BCPS, BCCP Clinical Pharmacist  Please check AMION for all [place] phone numbers After 10:00 PM, call [facility] [phone]

## 2018-12-09 NOTE — H&P (Signed)
History and Physical    [name] UMP:[id number] DOB: Aug 12, 1950 DOA: 12/08/2018  PCP: Tsosie Billing, MD (Inactive) Patient coming from: Home  Chief Complaint: Chest pain  HPI: [name] is a 68 y.o. 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  Patient took her home nitro and aspirin 324 mg prior to EMS arrival.   Patient states yesterday evening around 7 PM she experienced chest pain and shortness of breath while washing clothes.  The chest pain was substernal and pressure-like.  It radiated to her left arm and back.  She then took 4 baby aspirin's and 3 sublingual nitroglycerin tablets which alleviated her symptoms.  States for the past 2 days she has been having intermittent episodes of chest pain and shortness of breath with exertion.  A similar episode happened when she was at the grocery store.  Each time the symptoms resolve after she rests.  Denies history of blood clots.  Denies noticing any calf pain or swelling.  ED Course: Hemodynamically stable.  High-sensitivity troponin x2 negative.  EKG without acute ischemic changes.  Chest x-ray showing no active cardiopulmonary disease. Heparin bolus and infusion ordered.  Review of Systems:  All systems reviewed and apart from history of presenting illness, are negative.  Past Medical History:  Diagnosis Date   Ambulates with cane    straight   Angina    no current problems per patient at PAT appt 11/05/18   Arthritis    HANDS"   Asthma    Cancer ([place]) 09/04/2017   Right Breast   Carpal tunnel syndrome, bilateral 11/26/2016   Cholelithiasis    Cocaine abuse ([place]) 07/2012   per E.R. drug screen   COPD (chronic obstructive pulmonary  disease) ([place])    per patient   Coronary artery disease    Diabetes mellitus without mention of complication    type 2   Dysphagia    esophageal dysmotility on 03/2011 esophagram    Dyspnea    with exertion   Fatty liver disease, nonalcoholic 4008   on Imaging.    GERD (gastroesophageal reflux disease)    Headache    Hearing loss    bilateral - no hearing aids   History of colonic polyps 2009   adenomatous 2009, HP in 2009, 2011, 2013.    Hyperlipidemia    Hypertension    Iron deficiency anemia    Myocardial infarction [facility])    years ago, 6 stents placed   Ulnar neuropathy at elbow, 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   Wears dentures    full upper and partial lower   Wears glasses     Past Surgical History:  Procedure Laterality Date   ABDOMINAL ANGIOGRAM  02/20/2011   Procedure: ABDOMINAL ANGIOGRAM;  Surgeon: Clent Demark, MD;  Location: [facility] CATH LAB;  Service: Cardiovascular;;   CARPAL TUNNEL RELEASE Right 07/04/2017   Procedure: RIGHT CARPAL TUNNEL RELEASE;  Surgeon: Leanora Cover, MD;  Location: [place];  Service: Orthopedics;  Laterality: Right;   CARPAL TUNNEL RELEASE Left 09/12/2017   Procedure: LEFT CARPAL TUNNEL RELEASE;  Surgeon: Leanora Cover, MD;  Location: [place];  Service: Orthopedics;  Laterality: Left;   CHOLECYSTECTOMY N/A 11/11/2018   Procedure: ATTEMPTED LAPAROSCOPIC CHOLECUSTECOMY,  OPEN CHOLECYSTECTOMY;  Surgeon: Coralie Keens, MD;  Location: [place];  Service: General;  Laterality: N/A;   COLONOSCOPY  11/30/2011   Procedure: COLONOSCOPY;  Surgeon: Lafayette Dragon, MD;  Location: [facility] ENDOSCOPY;  Service: Endoscopy;  Laterality: N/A;   CORONARY ANGIOPLASTY WITH STENT PLACEMENT  02/20/2011   ERCP N/A 10/03/2018   Procedure: ENDOSCOPIC RETROGRADE CHOLANGIOPANCREATOGRAPHY (ERCP);  Surgeon: Ladene Artist, MD;  Location: [facility];  Service: Endoscopy;  Laterality: N/A;   ESOPHAGOGASTRODUODENOSCOPY  11/30/2011    Procedure: ESOPHAGOGASTRODUODENOSCOPY (EGD);  Surgeon: Lafayette Dragon, MD;  Location: [facility];  Service: Endoscopy;  Laterality: N/A;   ESOPHAGOGASTRODUODENOSCOPY N/A 02/23/2015   Procedure: ESOPHAGOGASTRODUODENOSCOPY (EGD);  Surgeon: Gatha Mayer, MD;  Location: [facility] ENDOSCOPY;  Service: Endoscopy;  Laterality: N/A;   EYE SURGERY Bilateral    cataracts removed   INTRAOPERATIVE CHOLANGIOGRAM N/A 11/11/2018   Procedure: Intraoperative Cholangiogram;  Surgeon: Coralie Keens, MD;  Location: [place];  Service: General;  Laterality: N/A;   LEFT HEART CATHETERIZATION WITH CORONARY ANGIOGRAM N/A 02/20/2011   Procedure: LEFT HEART CATHETERIZATION WITH CORONARY ANGIOGRAM;  Surgeon: Clent Demark, MD;  Location: [place] CATH LAB;  Service: Cardiovascular;  Laterality: N/A;   MASTECTOMY Right 11/05/2017   MASTECTOMY W/ SENTINEL NODE BIOPSY Right 11/27/2017   MASTECTOMY W/ SENTINEL NODE BIOPSY Right 11/27/2017   Procedure: RIGHT TOTAL MASTECTOMY WITH SENTINEL LYMPH NODE BIOPSY;  Surgeon: Excell Seltzer, MD;  Location: [place];  Service: General;  Laterality: Right;   PORT A CATH REVISION Left 05/07/2018   Procedure: PORT A CATH REVISION;  Surgeon: Excell Seltzer, MD;  Location: [facility];  Service: General;  Laterality: Left;   PORTACATH PLACEMENT Left 11/27/2017   Procedure: INSERTION PORT-A-CATH;  Surgeon: Excell Seltzer, MD;  Location: [place];  Service: General;  Laterality: Left;   RIGHT COLECTOMY  02/2007   for post polypectomy colonic perforation   SPHINCTEROTOMY  10/03/2018   Procedure: SPHINCTEROTOMY;  Surgeon: Ladene Artist, MD;  Location: [facility];  Service: Endoscopy;;   TUBAL LIGATION       reports that she quit smoking about 8 years ago. Her smoking use included cigarettes. She has a 23.50 pack-year smoking history. She has never used smokeless tobacco. She reports previous drug use. Drug: Cocaine. She reports that she does not drink alcohol.  No Known  Allergies  Family History  Problem Relation Age of Onset   Heart disease Mother    Diabetes Mother    Cancer Father        unsure what kind   Diabetes Sister    Colon cancer Neg Hx    Breast cancer Neg Hx     Prior to Admission medications   Medication Sig Start Date End Date Taking? Authorizing Provider  acetaminophen (TYLENOL) 325 MG tablet Take 2 tablets (650 mg total) by mouth every 4 (four) hours as needed for headache or mild pain. 07/14/18   Charolette Forward, MD  albuterol (PROVENTIL) (2.5 MG/3ML) 0.083% nebulizer solution INHALE THE CONTENTS OF 1 VIAL USING NEBULIZER EVERY 6 HOURS AS NEEDEDFOR WHEEZING OR SHORTNESS OF BREATH 11/25/18   Nicholas Lose, MD  allopurinol (ZYLOPRIM) 100 MG tablet Take 100 mg by mouth every evening.  09/17/18   [provider]  ALPRAZolam Duanne Moron) 0.25 MG tablet Take 0.25 mg by mouth at bedtime.     [provider]  amLODipine (NORVASC) 5 MG tablet Take 5 mg by mouth daily.    [provider]  anastrozole (ARIMIDEX) 1 MG tablet Take 1 tablet (1  mg total) by mouth daily. 02/27/18   Nicholas Lose, MD  aspirin 81 MG EC tablet Take 1 tablet (81 mg total) by mouth daily. 10/13/18   Pokhrel, Corrie Mckusick, MD  atorvastatin (LIPITOR) 20 MG tablet Take 20 mg by mouth daily at 6 PM.     [provider]  budesonide-formoterol (SYMBICORT) 160-4.5 MCG/ACT inhaler Inhale 1 puff into the lungs 2 (two) times daily as needed (respiratory issues.).     [provider]  feeding supplement, ENSURE ENLIVE, (ENSURE ENLIVE) LIQD Take 237 mLs by mouth 2 (two) times daily between meals. 02/24/15   Ghimire, Henreitta Leber, MD  LANTUS SOLOSTAR 100 UNIT/ML Solostar Pen Inject 10 Units into the skin at bedtime.  07/24/18   [provider]  loperamide (IMODIUM) 2 MG capsule Take 1 capsule (2 mg total) by mouth 4 (four) times daily as needed for diarrhea or loose stools. 02/27/15   Marella Chimes, PA-C  magnesium oxide (MAG-OX) 400 (241.3 Mg)  MG tablet Take 1 tablet (400 mg total) by mouth 2 (two) times daily. Patient not taking: Reported on 10/31/2018 10/07/18   Flora Lipps, MD  metoprolol succinate (TOPROL-XL) 50 MG 24 hr tablet Take 50 mg by mouth daily. Take with or immediately following a meal.    [provider]  Multiple Vitamin (MULTIVITAMIN WITH MINERALS) TABS tablet Take 1 tablet by mouth daily. [place]    [provider]  nitroGLYCERIN (NITROSTAT) 0.4 MG SL tablet Place 1 tablet (0.4 mg total) under the tongue every 5 (five) minutes as needed. For chest pain Patient taking differently: Place 0.4 mg under the tongue every 5 (five) minutes as needed for chest pain.  02/06/12   Patrecia Pour, NP  Omega-3 Fatty Acids (FISH OIL) 1000 MG CAPS Take 1,000 mg by mouth daily.    [provider]  ondansetron (ZOFRAN) 8 MG tablet Take 1 tablet (8 mg total) by mouth every 8 (eight) hours as needed for nausea or vomiting. 06/16/18   Nicholas Lose, MD  pantoprazole (PROTONIX) 40 MG tablet Take 1 tablet (40 mg total) by mouth daily. Patient taking differently: Take 40 mg by mouth at bedtime.  10/08/18 11/07/18  Pokhrel, Corrie Mckusick, MD  traMADol (ULTRAM) 50 MG tablet Take 1 tablet (50 mg total) by mouth every 6 (six) hours as needed for moderate pain. 11/18/18   Kalman Drape, PA    Physical Exam: Vitals:   12/08/18 2043 12/08/18 2346 12/09/18 0130  BP: (!) 149/95 (!) 147/77 (!) 140/117  Pulse: 81 80   Resp: 20 17 19   Temp: 99.1 F (37.3 C) 98.3 F (36.8 C)   TempSrc: Oral Oral   SpO2: 100% 100%     Physical Exam  Constitutional: She is oriented to person, place, and time. She appears well-developed and well-nourished.  HENT:  Head: Normocephalic.  Eyes: Right eye exhibits no discharge. Left eye exhibits no discharge.  Neck: Neck supple.  Cardiovascular: Normal rate, regular rhythm and intact distal pulses.  Pulmonary/Chest: Effort normal and breath sounds normal. No respiratory distress. She has no  wheezes. She has no rales.  Abdominal: Soft. Bowel sounds are normal. She exhibits no distension. There is no abdominal tenderness. There is no guarding.  Musculoskeletal:        General: No edema.     Comments: No calf tenderness, erythema, or edema.  Calves appear symmetric in size.  Neurological: She is alert and oriented to person, place, and time.  Skin: Skin is warm and dry.  She is not diaphoretic.  Psychiatric:  Anxious and tearful     Labs on Admission: I have personally reviewed following labs and imaging studies  CBC: Recent Labs  Lab 12/08/18 2045  WBC 9.8  HGB 11.6*  HCT 39.2  MCV 93.1  PLT 256*   Basic Metabolic Panel: Recent Labs  Lab 12/08/18 2045  NA 140  K 4.4  CL 106  CO2 23  GLUCOSE 109*  BUN 20  CREATININE 1.80*  CALCIUM 10.0   GFR: CrCl cannot be calculated (Unknown ideal weight.). Liver Function Tests: No results for input(s): AST, ALT, ALKPHOS, BILITOT, PROT, ALBUMIN in the last 168 hours. No results for input(s): LIPASE, AMYLASE in the last 168 hours. No results for input(s): AMMONIA in the last 168 hours. Coagulation Profile: No results for input(s): INR, PROTIME in the last 168 hours. Cardiac Enzymes: No results for input(s): CKTOTAL, CKMB, CKMBINDEX, TROPONINI in the last 168 hours. BNP (last 3 results) No results for input(s): PROBNP in the last 8760 hours. HbA1C: No results for input(s): HGBA1C in the last 72 hours. CBG: Recent Labs  Lab 12/09/18 0137  GLUCAP 104*   Lipid Profile: No results for input(s): CHOL, HDL, LDLCALC, TRIG, CHOLHDL, LDLDIRECT in the last 72 hours. Thyroid Function Tests: No results for input(s): TSH, T4TOTAL, FREET4, T3FREE, THYROIDAB in the last 72 hours. Anemia Panel: No results for input(s): VITAMINB12, FOLATE, FERRITIN, TIBC, IRON, RETICCTPCT in the last 72 hours. Urine analysis:    Component Value Date/Time   COLORURINE YELLOW 11/27/2015 0743   APPEARANCEUR CLOUDY (A) 11/27/2015 0743    LABSPEC 1.019 11/27/2015 0743   PHURINE 6.0 11/27/2015 0743   GLUCOSEU NEGATIVE 11/27/2015 0743   HGBUR NEGATIVE 11/27/2015 0743   BILIRUBINUR NEGATIVE 11/27/2015 0743   KETONESUR NEGATIVE 11/27/2015 0743   PROTEINUR NEGATIVE 11/27/2015 0743   UROBILINOGEN 0.2 03/31/2014 2017   NITRITE NEGATIVE 11/27/2015 0743   LEUKOCYTESUR MODERATE (A) 11/27/2015 0743    Radiological Exams on Admission: Dg Chest 2 View  Result Date: 12/08/2018 CLINICAL DATA:  Central chest pain, shortness of breath EXAM: CHEST - 2 VIEW COMPARISON:  11/17/2018 FINDINGS: Left Port-A-Cath remains in place, unchanged. Aortic atherosclerosis. Heart is normal size. Linear scarring at the left base. Right lung clear. No effusions or acute bony abnormality. IMPRESSION: No active cardiopulmonary disease. Electronically Signed   By: Rolm Baptise M.D.   On: 12/08/2018 21:04    EKG: Independently reviewed.  Sinus rhythm.  No acute ischemic changes.  Assessment/Plan Principal Problem:   Unstable angina (HCC) Active Problems:   HTN (hypertension)   CKD (chronic kidney disease), stage III   Insulin dependent type 2 diabetes mellitus (HCC)   COPD (chronic obstructive pulmonary disease) ([place])   Unstable angina Does have a history of CAD status post PTCA stenting to RCA and left circumflex in 2008 in 2009. Patient reports 2-day history of intermittent episodes of substernal pressure-like chest pain and dyspnea with exertion, symptoms resolve with rest each time.  Last episode was yesterday night at 7 PM when she was washing clothes.  Symptoms alleviated after taking 3 sublingual nitroglycerin tablets.  Hemodynamically stable.  Chest x-ray showing no active cardiopulmonary disease.  High-sensitivity troponin x2 negative.  EKG without acute ischemic changes.  Although cardiac biomarkers negative, there remains concern for unstable angina based on history provided by the patient.  PE is also on the differential given recent  surgery. -Cardiac monitoring -Patient took full dose aspirin at home -Heparin bolus and infusion -Sublingual nitroglycerin as  needed -EKG as needed recurrence of chest pain -Keep n.p.o. -Consult cardiology in a.m. Her cardiologist is Dr. Terrence Dupont. -VQ scan in a.m. to rule out PE.  Unable to do CT angiogram due to renal insufficiency/GFR 32. -Prior history of cocaine abuse listed in the chart.  Check UDS.  CKD stage III -Stable.  Creatinine 1.8, no significant change since labs done 11/27/2018.  Continue to monitor renal function.  Insulin-dependent diabetes mellitus -Check A1c.  Sliding scale insulin sensitive every 4 hours as patient is currently n.p.o. CBG checks.  History of asthma and COPD -Stable.  No bronchospasm.  Combivent as needed.  Hypertension -Stable.  Hydralazine as needed for SBP greater than 160.  Pharmacy med rec pending.  DVT prophylaxis: Heparin Code Status: Full code.  Discussed with the patient. Family Communication: No family available. Disposition Plan: Anticipate discharge after clinical improvement. Consults called: None Admission status: It is my clinical opinion that referral for OBSERVATION is reasonable and necessary in this patient based on the above information provided. The aforementioned taken together are felt to place the patient at high risk for further clinical deterioration. However it is anticipated that the patient may be medically stable for discharge from the hospital within 24 to 48 hours.  The medical decision making on this patient was of high complexity and the patient is at high risk for clinical deterioration, therefore this is a level 3 visit.  Shela Leff MD [facility] Pager [phone]  If 7PM-7AM, please contact night-coverage [email or web address] Password [email or web address]  12/09/2018, 1:51 AM

## 2018-12-09 NOTE — Progress Notes (Signed)
This is a 68 year old female with a past medical history of CAD s/p PCI, history of MI, asthma, COPD, CKD III, Type 2 diabetes, GERD, Hypertensino, hyperlipidemia, cocaine use, right breast cancer on anastrozole, recent cholecystectomy who presented to the ED on 11/2 and admitted earlier this morning for intermittent chest pain. Patient took home nitro and full dose aspirin prior to arrival.  She was admitted for unstable angina and placed on heparin drip.   This morning I was paged that the patient had recurrence of her chest pain while at rest which started to improve just prior to nitro administration, currently 3/10. Vitals remained stable.   Upon further questioning, the patient states that the chest pain is located on the left chest and she can point to it with one finger. Pain comes on with exertion and at rest. Patient was very tearful during exam and stated she was very anxious. She has been having a lot of stress in her life lately. Despite UDS positive for cocaine, patient denies any cocaine use for several years.   PE: General: tearful and anxious.  Heart S1 and S2 auscultated with 2/6 systolic murmur.  Lungs: Clear to auscultation bilaterally. Chest pain not reproducible with inspiration. MSK: chest pain reproducible with palpation over left 3rd/4th sternocostal angles similar to presenting symptoms.  A/P: More likely non cardiac chest pain, MSK Pain vs. Anxiety vs. PE (recent hospitalization and on Anastrazole) vs. Less likely unstable angina - reproducible on exam to palpation. Patient very anxious/tearful. Improvement prior to nitro, troponin negative x 2 with EKG at/near baseline on my read. Currently on heparin drip.  - Ativan 1 mg x 1 - VQ scan - Continue Heparin - Lidoderm patch to chest - Troponin - Consider inpatient vs. Outpatient stress test - Will reach out to cardiology pending further work up   Thank you,  Marva Panda, DO

## 2018-12-09 NOTE — Progress Notes (Addendum)
Patient started having 4 out of 10 chest discomfort mid-sternal after walking.   V.S and EKG obtained.  Started to give nitro but pain started to resolve.  Per pt still a 3 out of 10.   Paged MD awaiting orders  Per MD-  Ok to give nitro

## 2018-12-09 NOTE — Progress Notes (Signed)
Patient was suppose to go home with prescription for Lidoderm patch.   Prescription was not written.  Called PA for 7p to7a.   They would have to leave a message for MD to have them call in prescription for patch tomorrow

## 2018-12-09 NOTE — Discharge Summary (Signed)
Physician Discharge Summary  ISABELLY [name] VFI:[id number] DOB: [birth year] DOA: 12/08/2018  PCP: Tsosie Billing, MD (Inactive)  Admit date: 12/08/2018 Discharge date: 12/09/2018  Admitted From: Home Discharged to: [place]: No Equipment/Devices: No Discharge Condition: Stable  Recommendations for Outpatient Follow-up   1. Follow up with PCP in 1-2 weeks  2. Follow-up with cardiology in 1 week 3. Consider outpatient stress test  Medication Adjustments at Discharge  1. Lidoderm patch to the chest   Hospital Summary  [name] is a 68 y.o. 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  Patient took her home nitro and aspirin 324 mg prior to EMS arrival.   He has been having intermittent left-sided chest pain and shortness of breath while washing clothes and at rest.  She has been taking sublingual nitro at home and resting which were both alleviating her symptoms.  In the ED patient was noted to be hemodynamically stable, high-sensitivity troponin negative x2 EKG without ischemic changes.  Chest x-ray was ordered and was unremarkable and patient was started on heparin bolus and infusion and was placed in observation for concern of unstable angina versus pulmonary embolus given her cardiac history and atypical chest pain.  On morning of 11/3 patient had recurrence of chest pain 4/10 which improved to 3/10 prior to administration of nitroglycerin.  Repeat troponin was obtained at that time as well as EKG.  EKG remained at baseline without any acute changes and troponin remained negative.  She was incredibly anxious and tearful during that time as well.  On further exam, she  complained of left-sided chest pain which she could locate with one finger.  This chest pain was reproducible with palpation and she admitted this was the same chest pain she has been having over the past several days.  She was given Ativan 1 mg IV prior to VQ scan for significant anxiety and was given a Lidoderm patch to place on her chest area.  VQ scan was negative for pulmonary embolus.  Heparin drip was turned off.  Evening of 11/3 patient was able to ambulate with the nurse to the restroom and back with Lidoderm patch in place without any recurrence of her chest pain and remained hemodynamically stable on room air.  I reassessed the patient on evening of 11/3 and she stated that her chest pain had fully resolved with the Lidoderm patch. Chest pain likely secondary to anxiety in setting of musculoskeletal pain.  Of note, patient also has a left-sided chest port which may have exacerbated her musculoskeletal complaint.    She did however have a positive UDS for cocaine and benzodiazepines.  Patient is on benzodiazepines as an outpatient.  We had a significant discussion regarding refraining from cocaine use, especially while taking her home medications.  She became incredibly tearful during this talk and anxious and stated she does not use cocaine anymore and states she has not used cocaine since 2016 and was incredibly adamant about the,   Stating that she wants to live and does not do that.  Patient was discharged in stable condition with Lidoderm patch and pain medications and recommended to follow-up within the next several days with her cardiologist and primary care physician to return to  the ED if she had recurrence of symptoms.  A & P   Principal Problem:   Unstable angina (HCC) Active Problems:   HTN (hypertension)   CKD (chronic kidney disease), stage III   Insulin dependent type 2 diabetes mellitus (HCC)   COPD (chronic obstructive pulmonary disease) ([place])     Code Status: Full  Code Diet recommendation: Heart healthy  Consultants  . None  Procedures  . VQ scan   Subjective  Patient seen and examined at bedside no acute distress and resting comfortably after ambulating with nurse with no recurrence of her chest pain.  No events overnight.  Tolerating diet.  States she had resolution of her presenting chest pain with Lidoderm patch  Denies any shortness of breath, nausea, vomiting. Otherwise ROS negative   Objective   Discharge Exam: Vitals:   12/09/18 1151 12/09/18 1704  BP: 139/79 128/87  Pulse: 64 98  Resp: 18   Temp: 98 F (36.7 C)   SpO2: 97% 99%   Vitals:   12/09/18 0907 12/09/18 0940 12/09/18 1151 12/09/18 1704  BP: 137/85 116/81 139/79 128/87  Pulse: 85 89 64 98  Resp:   18   Temp:   98 F (36.7 C)   TempSrc:      SpO2: 100% 100% 97% 99%  Weight:      Height:        Physical Exam Vitals signs and nursing note reviewed.  Constitutional:      Appearance: Normal appearance.     Comments: Anxious, tearful  HENT:     Head: Normocephalic and atraumatic.     Nose: Nose normal.     Mouth/Throat:     Mouth: Mucous membranes are moist.  Eyes:     Extraocular Movements: Extraocular movements intact.  Neck:     Musculoskeletal: Normal range of motion. No neck rigidity.  Cardiovascular:     Rate and Rhythm: Normal rate and regular rhythm.     Heart sounds: Normal heart sounds. No murmur.  Pulmonary:     Effort: Pulmonary effort is normal.     Breath sounds: Normal breath sounds.  Chest:     Comments: Left-sided chest port  Producible chest pain with tenderness to palpation over third and fourth sternocostal junction Abdominal:     General: Abdomen is flat.     Palpations: Abdomen is soft.  Musculoskeletal: Normal range of motion.        General: No swelling.  Neurological:     General: No focal deficit present.     Mental Status: She is alert. Mental status is at baseline.  Psychiatric:        Mood and Affect: Mood is  anxious.       The results of significant diagnostics from this hospitalization (including imaging, microbiology, ancillary and laboratory) are listed below for reference.     Microbiology: Recent Results (from the past 240 hour(s))  SARS CORONAVIRUS 2 (TAT 6-24 HRS) Nasopharyngeal Nasopharyngeal Swab     Status: None   Collection Time: 12/09/18  2:01 AM   Specimen: Nasopharyngeal Swab  Result Value Ref Range Status   SARS Coronavirus 2 NEGATIVE NEGATIVE Final    Comment: (NOTE) SARS-CoV-2 target nucleic acids are NOT DETECTED. The SARS-CoV-2 RNA is generally detectable in upper and lower respiratory specimens during the acute phase of infection. Negative results do not preclude SARS-CoV-2 infection, do not rule out co-infections with other pathogens, and should not be used as the sole basis for treatment or other  patient management decisions. Negative results must be combined with clinical observations, patient history, and epidemiological information. The expected result is Negative. Fact Sheet for Patients: SugarRoll.be Fact Sheet for Healthcare Providers: https://www.woods-mathews.com/ This test is not yet approved or cleared by the [place] FDA and  has been authorized for detection and/or diagnosis of SARS-CoV-2 by FDA under an Emergency Use Authorization (EUA). This EUA will remain  in effect (meaning this test can be used) for the duration of the COVID-19 declaration under Section 56 4(b)(1) of the Act, 21 U.S.C. section 360bbb-3(b)(1), unless the authorization is terminated or revoked sooner. Performed at [facility], [place] [address], [place], [place] [address]      Labs: BNP (last 3 results) Recent Labs    07/12/18 1030  BNP 19.4   Basic Metabolic Panel: Recent Labs  Lab 12/08/18 2045 12/09/18 0316  NA 140 141  K 4.4 4.0  CL 106 109  CO2 23 22  GLUCOSE 109* 105*  BUN 20 17  CREATININE 1.80* 1.61*   CALCIUM 10.0 9.7   Liver Function Tests: No results for input(s): AST, ALT, ALKPHOS, BILITOT, PROT, ALBUMIN in the last 168 hours. No results for input(s): LIPASE, AMYLASE in the last 168 hours. No results for input(s): AMMONIA in the last 168 hours. CBC: Recent Labs  Lab 12/08/18 2045  WBC 9.8  HGB 11.6*  HCT 39.2  MCV 93.1  PLT 430*   Cardiac Enzymes: No results for input(s): CKTOTAL, CKMB, CKMBINDEX, TROPONINI in the last 168 hours. BNP: Invalid input(s): POCBNP CBG: Recent Labs  Lab 12/09/18 0137 12/09/18 0555 12/09/18 0827 12/09/18 1148 12/09/18 1630  GLUCAP 104* 97 108* 95 183*   D-Dimer No results for input(s): DDIMER in the last 72 hours. Hgb A1c Recent Labs    12/08/18 2045  HGBA1C 6.0*   Lipid Profile No results for input(s): CHOL, HDL, LDLCALC, TRIG, CHOLHDL, LDLDIRECT in the last 72 hours. Thyroid function studies No results for input(s): TSH, T4TOTAL, T3FREE, THYROIDAB in the last 72 hours.  Invalid input(s): FREET3 Anemia work up No results for input(s): VITAMINB12, FOLATE, FERRITIN, TIBC, IRON, RETICCTPCT in the last 72 hours. Urinalysis    Component Value Date/Time   COLORURINE YELLOW 11/27/2015 0743   APPEARANCEUR CLOUDY (A) 11/27/2015 0743   LABSPEC 1.019 11/27/2015 0743   PHURINE 6.0 11/27/2015 0743   GLUCOSEU NEGATIVE 11/27/2015 0743   HGBUR NEGATIVE 11/27/2015 0743   BILIRUBINUR NEGATIVE 11/27/2015 0743   KETONESUR NEGATIVE 11/27/2015 0743   PROTEINUR NEGATIVE 11/27/2015 0743   UROBILINOGEN 0.2 03/31/2014 2017   NITRITE NEGATIVE 11/27/2015 0743   LEUKOCYTESUR MODERATE (A) 11/27/2015 0743   Sepsis Labs Invalid input(s): PROCALCITONIN,  WBC,  LACTICIDVEN Microbiology Recent Results (from the past 240 hour(s))  SARS CORONAVIRUS 2 (TAT 6-24 HRS) Nasopharyngeal Nasopharyngeal Swab     Status: None   Collection Time: 12/09/18  2:01 AM   Specimen: Nasopharyngeal Swab  Result Value Ref Range Status   SARS Coronavirus 2 NEGATIVE  NEGATIVE Final    Comment: (NOTE) SARS-CoV-2 target nucleic acids are NOT DETECTED. The SARS-CoV-2 RNA is generally detectable in upper and lower respiratory specimens during the acute phase of infection. Negative results do not preclude SARS-CoV-2 infection, do not rule out co-infections with other pathogens, and should not be used as the sole basis for treatment or other patient management decisions. Negative results must be combined with clinical observations, patient history, and epidemiological information. The expected result is Negative. Fact Sheet for Patients: SugarRoll.be Fact Sheet  for Healthcare Providers: https://www.woods-mathews.com/ This test is not yet approved or cleared by the [place] and  has been authorized for detection and/or diagnosis of SARS-CoV-2 by FDA under an Emergency Use Authorization (EUA). This EUA will remain  in effect (meaning this test can be used) for the duration of the COVID-19 declaration under Section 56 4(b)(1) of the Act, 21 U.S.C. section 360bbb-3(b)(1), unless the authorization is terminated or revoked sooner. Performed at [facility], [place] [address] [place], [place] [address]     Discharge Instructions     Discharge Instructions    Diet - low sodium heart healthy   Complete by: As directed    Discharge instructions   Complete by: As directed    You were seen in the hospital for chest pain. Your chest pain was thought to be anxiety and musculoskeletal in nature and not from your heart.   Upon Discharge:  - Take your medications as prescribed  - Refrain from ANY cocaine use  - Use Lidoderm patches as prescribed to your chest - Make an appointment with your cardiologist this week - Follow up with your primary care physician  If you have any significant change or worsening of your symptoms, do not hesitate to contact your primary care physician or return to the ED    Increase activity slowly   Complete by: As directed      Allergies as of 12/09/2018   No Known Allergies     Medication List    STOP taking these medications   feeding supplement (ENSURE ENLIVE) Liqd     TAKE these medications   acetaminophen 325 MG tablet Commonly known as: TYLENOL Take 2 tablets (650 mg total) by mouth every 4 (four) hours as needed for headache or mild pain.   albuterol (2.5 MG/3ML) 0.083% nebulizer solution Commonly known as: PROVENTIL INHALE THE CONTENTS OF 1 VIAL USING NEBULIZER EVERY 6 HOURS AS NEEDEDFOR WHEEZING OR SHORTNESS OF BREATH What changed: See the new instructions.   allopurinol 300 MG tablet Commonly known as: ZYLOPRIM Take 300 mg by mouth every evening.   ALPRAZolam 0.25 MG tablet Commonly known as: XANAX Take 0.25 mg by mouth at bedtime.   amLODipine 5 MG tablet Commonly known as: NORVASC Take 5 mg by mouth daily.   anastrozole 1 MG tablet Commonly known as: ARIMIDEX Take 1 tablet (1 mg total) by mouth daily.   aspirin 81 MG EC tablet Take 1 tablet (81 mg total) by mouth daily.   atorvastatin 20 MG tablet Commonly known as: LIPITOR Take 20 mg by mouth daily at 6 PM.   budesonide-formoterol 160-4.5 MCG/ACT inhaler Commonly known as: SYMBICORT Inhale 1 puff into the lungs 2 (two) times daily as needed (respiratory issues.).   Fish Oil 1000 MG Caps Take 1,000 mg by mouth daily.   Lantus SoloStar 100 UNIT/ML Solostar Pen Generic drug: Insulin Glargine Inject 10 Units into the skin at bedtime.   loperamide 2 MG capsule Commonly known as: IMODIUM Take 1 capsule (2 mg total) by mouth 4 (four) times daily as needed for diarrhea or loose stools.   magnesium oxide 400 (241.3 Mg) MG tablet Commonly known as: MAG-OX Take 1 tablet (400 mg total) by mouth 2 (two) times daily.   metoprolol succinate 50 MG 24 hr tablet Commonly known as: TOPROL-XL Take 50 mg by mouth daily. Take with or immediately following a meal.    multivitamin with minerals Tabs tablet Take 1 tablet by mouth daily. [place]  nitroGLYCERIN 0.4 MG SL tablet Commonly known as: NITROSTAT Place 1 tablet (0.4 mg total) under the tongue every 5 (five) minutes as needed. For chest pain What changed:   reasons to take this  additional instructions   ondansetron 8 MG tablet Commonly known as: ZOFRAN Take 1 tablet (8 mg total) by mouth every 8 (eight) hours as needed for nausea or vomiting.   pantoprazole 40 MG tablet Commonly known as: PROTONIX Take 1 tablet (40 mg total) by mouth daily. What changed: when to take this   traMADol 50 MG tablet Commonly known as: ULTRAM Take 1 tablet (50 mg total) by mouth every 6 (six) hours as needed for moderate pain.      Follow-up Information    Charolette Forward, MD.   Specialty: Cardiology Why: Please follow up  Contact information: [address] [place] [address] [phone]          No Known Allergies  Time coordinating discharge: Over 30 minutes   SIGNED:   Harold Hedge, D.O. [facility] 12/09/2018, 6:24 PM

## 2018-12-09 NOTE — Progress Notes (Signed)
ANTICOAGULATION CONSULT NOTE - Initial Consult  Pharmacy Consult for heparin Indication: chest pain/ACS  No Known Allergies  Patient Measurements:   Heparin Dosing Weight: 58 kg  Vital Signs: Temp: 98.3 F (36.8 C) (11/02 2346) Temp Source: Oral (11/02 2346) BP: 147/77 (11/02 2346) Pulse Rate: 80 (11/02 2346)  Labs: Recent Labs    12/08/18 2045 12/08/18 2333  HGB 11.6*  --   HCT 39.2  --   PLT 430*  --   CREATININE 1.80*  --   TROPONINIHS 6 7    CrCl cannot be calculated (Unknown ideal weight.).   Medical History: Past Medical History:  Diagnosis Date  . Ambulates with cane    straight  . Angina    no current problems per patient at PAT appt 11/05/18  . Arthritis    HANDS"  . Asthma   . Cancer ([place]) 09/04/2017   Right Breast  . Carpal tunnel syndrome, bilateral 11/26/2016  . Cholelithiasis   . Cocaine abuse ([place]) 07/2012   per E.R. drug screen  . COPD (chronic obstructive pulmonary disease) ([place])    per patient  . Coronary artery disease   . Diabetes mellitus without mention of complication    type 2  . Dysphagia    esophageal dysmotility on 03/2011 esophagram   . Dyspnea    with exertion  . Fatty liver disease, nonalcoholic 1779   on Imaging.   Marland Kitchen GERD (gastroesophageal reflux disease)   . Headache   . Hearing loss    bilateral - no hearing aids  . History of colonic polyps 2009   adenomatous 2009, HP in 2009, 2011, 2013.   Marland Kitchen Hyperlipidemia   . Hypertension   . Iron deficiency anemia   . Myocardial infarction ([place])    years ago, 6 stents placed  . 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 Wears dentures    full upper and partial lower  . Wears glasses     Medications:  See med history  Assessment: 68 yo lady to start heparin for CP.  She has h/o MI.  She was not on anticoagulation PTA.  Hg 11.6, PTLC 430 Goal of Therapy:  Heparin level 0.3-0.7 units/ml Monitor platelets by anticoagulation protocol: Yes   Plan:  Heparin bolus  2500 units and drip at 700 units/hr Check heparin level 6-8 hours after start Daily HL and CBC while on heparin Monitor for bleeding complications  Thanks for allowing pharmacy to be a part of this patient's care.  Excell Seltzer, PharmD Clinical Pharmacist 12/09/2018,12:48 AM

## 2018-12-09 NOTE — Progress Notes (Signed)
Walked patient about 120 ft.  Patient denies SOB, Chest pain and dizziness.   While ambulating patient's HR got up around 115.    Post v.s - BP 128/87, 99% RA and HR 98  MD came by to speak to Patient.  Will be discharging pt this evening

## 2018-12-10 NOTE — TOC Progression Note (Signed)
Transition of Care [facility]) - Progression Note    Patient Details  Name: [name] MRN: [id number] Date of Birth: [birth year]  Transition of Care [facility]) CM/SW Contact  Zenon Mayo, RN Phone Number: 12/10/2018, 9:01 AM  Clinical Narrative:    NCM received call from patient stating she does not have her lidoderm patch script and she does not know where to pick up her meds.  NCM did not see a pharmacy listed on [place] .  NCM contacted [facility] office and spoke with Denton Ar, she will give this information to the [facility] office sot they can take care of this for patient.        Expected Discharge Plan and Services           Expected Discharge Date: 12/09/18                                     Social Determinants of Health (SDOH) Interventions    Readmission Risk Interventions Readmission Risk Prevention Plan 11/12/2018  Transportation Screening Complete  Medication Review [employer]) Complete  PCP or Specialist appointment within 3-5 days of discharge Complete  HRI or [place] Complete  SW Recovery Care/Counseling Consult Complete  [place] Not Applicable  Some recent data might be hidden

## 2018-12-17 NOTE — Progress Notes (Signed)
Patient Care Team: Tsosie Billing, MD (Inactive) as PCP - General (Internal Medicine)  DIAGNOSIS:    ICD-10-CM   1. Malignant neoplasm of upper-outer quadrant of right breast in female, estrogen receptor positive ([place])  C50.411    Z17.0     SUMMARY OF ONCOLOGIC HISTORY: Oncology History  Malignant neoplasm of upper-outer quadrant of right breast in female, estrogen receptor positive ([place])  09/03/2017 Initial Diagnosis   Screening mammogram detected 1.4 cm right breast mass at 10 o'clock position, additional pleomorphic calcifications 2 cm posteriorly, additional loosely grouped calcifications 10.1 x 7.1 x 5.3 cm, single right axillary lymph node: Biopsy revealed DCIS ER 100%, PR 50%; IDC grade 2-3 ER 100%, PR 50%, Ki-67 15%, HER-2 positive ratio 2, copy #5, lymph node negative T2 N0 stage Ia clinical stage AJCC 8   09/03/2017 Cancer Staging   Staging form: Breast, AJCC 8th Edition - Clinical stage from 09/03/2017: Stage IB (cT2, cN0, cM0, G3, ER+, PR+, HER2+) - Signed by Gardenia Phlegm, NP on 04/10/2018   11/27/2017 Surgery   Right mastectomy: IDC grade 2, 1.6 cm, separate foci of DCIS intermediate grade, margins negative, 0/5 lymph nodes negative,ER 100%, PR 50%, Ki-67 15%, HER-2 positive ratio 2, copy #5 T1c N0 stage Ia   11/27/2017 Cancer Staging   Staging form: Breast, AJCC 8th Edition - Pathologic stage from 11/27/2017: Stage IA (pT1c, pN0, cM0, G2, ER+, PR+, HER2+) - Signed by Gardenia Phlegm, NP on 04/10/2018   02/07/2018 -  Chemotherapy   The patient had trastuzumab (HERCEPTIN) 483 mg in sodium chloride 0.9 % 250 mL chemo infusion, 8 mg/kg = 483 mg, Intravenous,  Once, 11 of 11 cycles Administration: 483 mg (02/07/2018), 357 mg (02/27/2018), 357 mg (05/01/2018), 357 mg (05/23/2018), 357 mg (06/12/2018), 357 mg (07/03/2018), 357 mg (08/14/2018), 357 mg (09/04/2018), 357 mg (10/17/2018), 357 mg (03/20/2018), 357 mg (04/10/2018) trastuzumab-anns (KANJINTI) 357 mg in sodium  chloride 0.9 % 250 mL chemo infusion, 6 mg/kg = 357 mg (100 % of original dose 6 mg/kg), Intravenous,  Once, 2 of 7 cycles Dose modification: 6 mg/kg (original dose 6 mg/kg, Cycle 12, Reason: Other (see comments)) Administration: 357 mg (11/06/2018), 357 mg (11/27/2018)  for chemotherapy treatment.    02/27/2018 -  Anti-estrogen oral therapy   Anastrozole 89m daily     CHIEF COMPLIANT: Follow-up of right breast cancer on Herceptin with anastrozole  INTERVAL HISTORY: Dominique DRENNINGis a 68y.o. with above-mentioned history of right breast cancer whounderwent a right mastectomy. Sheis currently on adjuvant therapy with Herceptin and anti-estrogen therapy withanastrozole. She underwent a cholecystectomy on 11/11/18. She was admitted from 11/3-11/4 after presenting to the ED for chest pain. She presents to the clinic today for treatment.  REVIEW OF SYSTEMS:   Constitutional: Denies fevers, chills or abnormal weight loss Eyes: Denies blurriness of vision Ears, nose, mouth, throat, and face: Denies mucositis or sore throat Respiratory: Denies cough, dyspnea or wheezes Cardiovascular: Denies palpitation, chest discomfort Gastrointestinal: Denies nausea, heartburn or change in bowel habits Skin: Denies abnormal skin rashes Lymphatics: Denies new lymphadenopathy or easy bruising Neurological: Denies numbness, tingling or new weaknesses Behavioral/Psych: Mood is stable, no new changes  Extremities: No lower extremity edema Breast: denies any pain or lumps or nodules in either breasts All other systems were reviewed with the patient and are negative.  I have reviewed the past medical history, past surgical history, social history and family history with the patient and they are unchanged from previous note.  ALLERGIES:  has No Known Allergies.  MEDICATIONS:  Current Outpatient Medications  Medication Sig Dispense Refill  . acetaminophen (TYLENOL) 325 MG tablet Take 2 tablets (650 mg total)  by mouth every 4 (four) hours as needed for headache or mild pain. 60 tablet 1  . albuterol (PROVENTIL) (2.5 MG/3ML) 0.083% nebulizer solution INHALE THE CONTENTS OF 1 VIAL USING NEBULIZER EVERY 6 HOURS AS NEEDEDFOR WHEEZING OR SHORTNESS OF BREATH (Patient taking differently: Take 2.5 mg by nebulization every 6 (six) hours as needed for wheezing or shortness of breath. ) 75 mL 0  . allopurinol (ZYLOPRIM) 300 MG tablet Take 300 mg by mouth every evening.     Marland Kitchen ALPRAZolam (XANAX) 0.25 MG tablet Take 0.25 mg by mouth at bedtime.     Marland Kitchen amLODipine (NORVASC) 5 MG tablet Take 5 mg by mouth daily.    Marland Kitchen anastrozole (ARIMIDEX) 1 MG tablet Take 1 tablet (1 mg total) by mouth daily. 90 tablet 3  . aspirin 81 MG EC tablet Take 1 tablet (81 mg total) by mouth daily. 30 tablet 0  . atorvastatin (LIPITOR) 20 MG tablet Take 20 mg by mouth daily at 6 PM.     . budesonide-formoterol (SYMBICORT) 160-4.5 MCG/ACT inhaler Inhale 1 puff into the lungs 2 (two) times daily as needed (respiratory issues.).     Marland Kitchen LANTUS SOLOSTAR 100 UNIT/ML Solostar Pen Inject 10 Units into the skin at bedtime.     Marland Kitchen loperamide (IMODIUM) 2 MG capsule Take 1 capsule (2 mg total) by mouth 4 (four) times daily as needed for diarrhea or loose stools. 12 capsule 0  . magnesium oxide (MAG-OX) 400 (241.3 Mg) MG tablet Take 1 tablet (400 mg total) by mouth 2 (two) times daily. (Patient not taking: Reported on 10/31/2018) 60 tablet 0  . metoprolol succinate (TOPROL-XL) 50 MG 24 hr tablet Take 50 mg by mouth daily. Take with or immediately following a meal.    . Multiple Vitamin (MULTIVITAMIN WITH MINERALS) TABS tablet Take 1 tablet by mouth daily. Sentry    . nitroGLYCERIN (NITROSTAT) 0.4 MG SL tablet Place 1 tablet (0.4 mg total) under the tongue every 5 (five) minutes as needed. For chest pain (Patient taking differently: Place 0.4 mg under the tongue every 5 (five) minutes as needed for chest pain. ) 10 tablet 0  . Omega-3 Fatty Acids (FISH OIL) 1000  MG CAPS Take 1,000 mg by mouth daily.    . ondansetron (ZOFRAN) 8 MG tablet Take 1 tablet (8 mg total) by mouth every 8 (eight) hours as needed for nausea or vomiting. 20 tablet 0  . pantoprazole (PROTONIX) 40 MG tablet Take 1 tablet (40 mg total) by mouth daily. (Patient taking differently: Take 40 mg by mouth at bedtime. ) 30 tablet 0  . traMADol (ULTRAM) 50 MG tablet Take 1 tablet (50 mg total) by mouth every 6 (six) hours as needed for moderate pain. 30 tablet 0   No current facility-administered medications for this visit.     PHYSICAL EXAMINATION: ECOG PERFORMANCE STATUS: 2 - Symptomatic, <50% confined to bed  Vitals:   12/18/18 1334  BP: 135/78  Pulse: 66  Resp: 17  Temp: 98.7 F (37.1 C)  SpO2: 100%   Filed Weights   12/18/18 1334  Weight: 129 lb 11.2 oz (58.8 kg)    GENERAL: alert, no distress and comfortable SKIN: skin color, texture, turgor are normal, no rashes or significant lesions EYES: normal, Conjunctiva are pink and non-injected, sclera clear OROPHARYNX: no exudate,  no erythema and lips, buccal mucosa, and tongue normal  NECK: supple, thyroid normal size, non-tender, without nodularity LYMPH: no palpable lymphadenopathy in the cervical, axillary or inguinal LUNGS: clear to auscultation and percussion with normal breathing effort HEART: regular rate & rhythm and no murmurs and no lower extremity edema ABDOMEN: abdomen soft, non-tender and normal bowel sounds MUSCULOSKELETAL: no cyanosis of digits and no clubbing  NEURO: alert & oriented x 3 with fluent speech, no focal motor/sensory deficits EXTREMITIES: No lower extremity edema  LABORATORY DATA:  I have reviewed the data as listed CMP Latest Ref Rng & Units 12/09/2018 12/08/2018 11/27/2018  Glucose 70 - 99 mg/dL 105(H) 109(H) 99  BUN 8 - 23 mg/dL 17 20 18   Creatinine 0.44 - 1.00 mg/dL 1.61(H) 1.80(H) 1.91(H)  Sodium 135 - 145 mmol/L 141 140 141  Potassium 3.5 - 5.1 mmol/L 4.0 4.4 4.6  Chloride 98 - 111  mmol/L 109 106 104  CO2 22 - 32 mmol/L 22 23 27   Calcium 8.9 - 10.3 mg/dL 9.7 10.0 10.6(H)  Total Protein 6.5 - 8.1 g/dL - - 8.8(H)  Total Bilirubin 0.3 - 1.2 mg/dL - - 0.4  Alkaline Phos 38 - 126 U/L - - 202(H)  AST 15 - 41 U/L - - 20  ALT 0 - 44 U/L - - 24    Lab Results  Component Value Date   WBC 8.4 12/18/2018   HGB 11.6 (L) 12/18/2018   HCT 37.5 12/18/2018   MCV 92.4 12/18/2018   PLT 322 12/18/2018   NEUTROABS 3.9 12/18/2018    ASSESSMENT & PLAN:  Malignant neoplasm of upper-outer quadrant of right breast in female, estrogen receptor positive ([place]) 11/27/2017 right mastectomy: IDC grade 2, 1.6 cm, separate foci of DCIS intermediate grade, margins negative, 0/5 lymph nodes negative,ER 100%, PR 50%, Ki-67 15%, HER-2 positive ratio 2, copy #5 T1c N0 stage Ia  Treatment plan: 1. Adjuvant Herceptin every 3 weeks for 1 year (because of poor performance status Taxol is felt to be intolerable for her), started January 2020 2. Adjuvant antiestrogen therapy with letrozole 2.5 mg daily  Patient's daughter is the primary point of contact for her and all appointment changes will need to go through her. -------------------------------------------------------------------------------------- Hospitalization 10/02/2018-10/07/2018: Acute cholecystitis Hospitalization: 11/11/2018 through 11/18/2018: Chronic cholecystitis with gallstones: Cholecystectomy Hospitalization: 12/07/2020 12/09/2018: Chest pain, VQ scan negative for PE, the predictable chest pain treated with Lidoderm patch  Herceptin toxicities: None Severe anemia: Hemoglobin improved 11.6 on 12/18/2018  Return to clinic every 3 weeks for Herceptin every 6 weeks for follow-up with me.  She has 4 more doses of Herceptin left.    No orders of the defined types were placed in this encounter.  The patient has a good understanding of the overall plan. she agrees with it. she will call with any problems that may develop before the  next visit here.  Dominique Lose, MD 12/18/2018  [name] am acting as scribe for Dr. Nicholas [name].  I have reviewed the above documentation for accuracy and completeness, and I agree with the above.

## 2018-12-18 ENCOUNTER — Inpatient Hospital Stay (HOSPITAL_BASED_OUTPATIENT_CLINIC_OR_DEPARTMENT_OTHER): Payer: Medicare Other | Admitting: Hematology and Oncology

## 2018-12-18 ENCOUNTER — Encounter: Payer: Self-pay | Admitting: Hematology and Oncology

## 2018-12-18 ENCOUNTER — Other Ambulatory Visit: Payer: Self-pay

## 2018-12-18 ENCOUNTER — Inpatient Hospital Stay: Payer: Medicare Other

## 2018-12-18 ENCOUNTER — Inpatient Hospital Stay: Payer: Medicare Other | Attending: Oncology

## 2018-12-18 DIAGNOSIS — Z170 Estrogen receptor positive status [ER+]: Secondary | ICD-10-CM

## 2018-12-18 DIAGNOSIS — Z79899 Other long term (current) drug therapy: Secondary | ICD-10-CM | POA: Diagnosis not present

## 2018-12-18 DIAGNOSIS — Z95828 Presence of other vascular implants and grafts: Secondary | ICD-10-CM

## 2018-12-18 DIAGNOSIS — Z5112 Encounter for antineoplastic immunotherapy: Secondary | ICD-10-CM | POA: Insufficient documentation

## 2018-12-18 DIAGNOSIS — C50411 Malignant neoplasm of upper-outer quadrant of right female breast: Secondary | ICD-10-CM

## 2018-12-18 LAB — CBC WITH DIFFERENTIAL (CANCER CENTER ONLY)
Abs Immature Granulocytes: 0.03 10*3/uL (ref 0.00–0.07)
Basophils Absolute: 0 10*3/uL (ref 0.0–0.1)
Basophils Relative: 1 %
Eosinophils Absolute: 0.4 10*3/uL (ref 0.0–0.5)
Eosinophils Relative: 4 %
HCT: 37.5 % (ref 36.0–46.0)
Hemoglobin: 11.6 g/dL — ABNORMAL LOW (ref 12.0–15.0)
Immature Granulocytes: 0 %
Lymphocytes Relative: 39 %
Lymphs Abs: 3.2 10*3/uL (ref 0.7–4.0)
MCH: 28.6 pg (ref 26.0–34.0)
MCHC: 30.9 g/dL (ref 30.0–36.0)
MCV: 92.4 fL (ref 80.0–100.0)
Monocytes Absolute: 0.8 10*3/uL (ref 0.1–1.0)
Monocytes Relative: 9 %
Neutro Abs: 3.9 10*3/uL (ref 1.7–7.7)
Neutrophils Relative %: 47 %
Platelet Count: 322 10*3/uL (ref 150–400)
RBC: 4.06 MIL/uL (ref 3.87–5.11)
RDW: 17.9 % — ABNORMAL HIGH (ref 11.5–15.5)
WBC Count: 8.4 10*3/uL (ref 4.0–10.5)
nRBC: 0 % (ref 0.0–0.2)

## 2018-12-18 MED ORDER — HEPARIN SOD (PORK) LOCK FLUSH 100 UNIT/ML IV SOLN
500.0000 [IU] | Freq: Once | INTRAVENOUS | Status: AC | PRN
  Administered 2018-12-18: 500 [IU]
  Filled 2018-12-18: qty 5

## 2018-12-18 MED ORDER — ANASTROZOLE 1 MG PO TABS: 1.0000 mg | ORAL_TABLET | Freq: Every day | ORAL | 3 refills | Status: DC

## 2018-12-18 MED ORDER — DIPHENHYDRAMINE HCL 25 MG PO CAPS
50.0000 mg | ORAL_CAPSULE | Freq: Once | ORAL | Status: AC
  Administered 2018-12-18: 50 mg via ORAL

## 2018-12-18 MED ORDER — ACETAMINOPHEN 325 MG PO TABS
ORAL_TABLET | ORAL | Status: AC
  Filled 2018-12-18: qty 2

## 2018-12-18 MED ORDER — SODIUM CHLORIDE 0.9% FLUSH
10.0000 mL | INTRAVENOUS | Status: DC | PRN
  Administered 2018-12-18: 10 mL
  Filled 2018-12-18: qty 10

## 2018-12-18 MED ORDER — SODIUM CHLORIDE 0.9 % IV SOLN
Freq: Once | INTRAVENOUS | Status: AC
  Administered 2018-12-18: 15:00:00 via INTRAVENOUS
  Filled 2018-12-18: qty 250

## 2018-12-18 MED ORDER — TRASTUZUMAB-ANNS CHEMO 150 MG IV SOLR
6.0000 mg/kg | Freq: Once | INTRAVENOUS | Status: AC
  Administered 2018-12-18: 357 mg via INTRAVENOUS
  Filled 2018-12-18: qty 17

## 2018-12-18 MED ORDER — DIPHENHYDRAMINE HCL 25 MG PO CAPS
ORAL_CAPSULE | ORAL | Status: AC
  Filled 2018-12-18: qty 2

## 2018-12-18 MED ORDER — ACETAMINOPHEN 325 MG PO TABS
650.0000 mg | ORAL_TABLET | Freq: Once | ORAL | Status: AC
  Administered 2018-12-18: 650 mg via ORAL

## 2018-12-18 NOTE — Progress Notes (Signed)
Met with patient to obtain proof of income for one-time $1000 Advertising account executive.  Patient approved for the grant. She has a copy of the approval letter as well as the expense sheet along with the Outpatient pharmacy information. She received a gas card today from her grant.  Gave her my card for any additional financial questions or concerns.

## 2018-12-18 NOTE — Assessment & Plan Note (Signed)
11/27/2017 right mastectomy: IDC grade 2, 1.6 cm, separate foci of DCIS intermediate grade, margins negative, 0/5 lymph nodes negative,ER 100%, PR 50%, Ki-67 15%, HER-2 positive ratio 2, copy #5 T1c N0 stage Ia  Treatment plan: 1. Adjuvant Herceptin every 3 weeks for 1 year (because of poor performance status Taxol is felt to be intolerable for her), started January 2020 2. Adjuvant antiestrogen therapy with letrozole 2.5 mg daily  Patient's daughter is the primary point of contact for her and all appointment changes will need to go through her. -------------------------------------------------------------------------------------- Hospitalization 10/02/2018-10/07/2018: Acute cholecystitis Hospitalization: 11/11/2018 through 11/18/2018: Chronic cholecystitis with gallstones Hospitalization: 12/07/2020 12/09/2018: Chest pain, VQ scan negative for PE, the predictable chest pain treated with Lidoderm patch  Herceptin toxicities: None Severe anemia: Hemoglobin improved 11.6 on 12/08/2018  Return to clinic every 3 weeks for Herceptin every 6 weeks for follow-up with me.

## 2018-12-19 ENCOUNTER — Telehealth: Payer: Self-pay | Admitting: Hematology and Oncology

## 2018-12-19 NOTE — Telephone Encounter (Signed)
I talk with patient regarding schedule and she ask for mail so I asked Bev

## 2019-01-07 ENCOUNTER — Other Ambulatory Visit: Payer: Self-pay

## 2019-01-07 ENCOUNTER — Ambulatory Visit (HOSPITAL_BASED_OUTPATIENT_CLINIC_OR_DEPARTMENT_OTHER)
Admission: RE | Admit: 2019-01-07 | Discharge: 2019-01-07 | Disposition: A | Discharge status: Home / Self Care | Payer: Medicare Other | Source: Ambulatory Visit | Attending: Internal Medicine | Admitting: Internal Medicine

## 2019-01-07 ENCOUNTER — Ambulatory Visit (HOSPITAL_COMMUNITY)
Admission: RE | Admit: 2019-01-07 | Discharge: 2019-01-07 | Disposition: A | Discharge status: Home / Self Care | Payer: Medicare Other | Source: Ambulatory Visit | Attending: Internal Medicine | Admitting: Internal Medicine

## 2019-01-07 ENCOUNTER — Encounter (HOSPITAL_COMMUNITY): Payer: Self-pay | Admitting: Internal Medicine

## 2019-01-07 VITALS — BP 130/84 | HR 61 | Wt 131.8 lb

## 2019-01-07 DIAGNOSIS — Z5111 Encounter for antineoplastic chemotherapy: Secondary | ICD-10-CM | POA: Diagnosis present

## 2019-01-07 DIAGNOSIS — R0600 Dyspnea, unspecified: Secondary | ICD-10-CM | POA: Insufficient documentation

## 2019-01-07 DIAGNOSIS — Z170 Estrogen receptor positive status [ER+]: Secondary | ICD-10-CM | POA: Diagnosis not present

## 2019-01-07 DIAGNOSIS — I2510 Atherosclerotic heart disease of native coronary artery without angina pectoris: Secondary | ICD-10-CM | POA: Diagnosis not present

## 2019-01-07 DIAGNOSIS — C50411 Malignant neoplasm of upper-outer quadrant of right female breast: Secondary | ICD-10-CM | POA: Diagnosis not present

## 2019-01-07 DIAGNOSIS — J449 Chronic obstructive pulmonary disease, unspecified: Secondary | ICD-10-CM | POA: Diagnosis not present

## 2019-01-07 DIAGNOSIS — I252 Old myocardial infarction: Secondary | ICD-10-CM | POA: Diagnosis not present

## 2019-01-07 DIAGNOSIS — I358 Other nonrheumatic aortic valve disorders: Secondary | ICD-10-CM | POA: Diagnosis not present

## 2019-01-07 NOTE — Progress Notes (Signed)
Cardio-Oncology Clinic Note   Date:  01/07/2019   ID:  [name], [name] Jun 16, 1950, MRN [id number]  Location: Home  Provider location: [facility] Type of Visit: Established patient  PCP:  Tsosie Billing, MD (Inactive)  Cardiologist:  No primary care provider on file. Primary HF: [name]  Chief Complaint: Cardio-oncology follow-up   History of Present Illness:  [name] is a 68 y.o. female with past medical history of R breast cancer, CAD s/p RCA and OM stent followed by Dr. Terrence Dupont who has been referred by Dr. Lindi Adie to establish in the cardio-oncology clinic for monitoring of cardio-toxicity while undergoing chemotherapy for her R breast CA..      Malignant neoplasm of upper-outer quadrant of right breast in female, estrogen receptor positive ([place])   09/03/2017 Initial Diagnosis    Screening mammogram detected 1.4 cm right breast mass at 10 o'clock position, additional pleomorphic calcifications 2 cm posteriorly, additional loosely grouped calcifications 10.1 x 7.1 x 5.3 cm, single right axillary lymph node: Biopsy revealed DCIS ER 100%, PR 50%; IDC grade 2-3 ER 100%, PR 50%, Ki-67 15%, HER-2 positive ratio 2, copy #5, lymph node negative T2 N0 stage Ia clinical stage AJCC 8    11/27/2017 Surgery    Right mastectomy: IDC grade 2, 1.6 cm, separate foci of DCIS intermediate grade, margins negative, 0/5 lymph nodes negative,ER 100%, PR 50%, Ki-67 15%, HER-2 positive ratio 2, copy #5 T1c N0 stage Ia   Treatment Plan  Herceptin q 3 weeks x 1 year. To finish 01/2019 if stays on schedule.   She presents for routine f/u. Recently struggling with cholecystitis. Underwent open chole in October. Readmitted last month with atypical CP. Trop negative. UDS positive for cocaine. Seen by Dr. Terrence Dupont felt to by musculoskeletal.  Tolerating herceptin well. Has 4 doses left.   Echo today EF 50-55% mild AI Grade I DD GLS -17.1   Echo   09/30/18  EF 55-60% moderate AI Personally reviewed   Echo 07/10/18 EF 50-55% GLS inaccurate  Echo 1/20  LVEF 55-60%, mild AI, Grade 1 DD, -16.4  Echo 11/26/17 LVEF 50-55%, Grade 1 DD, Mild/mod AI, GLS - 14.55  Stress test 04/20/2016 (Most recent) 1. No reversible ischemia or infarction. 2. Normal left ventricular wall motion. 3. Left ventricular ejection fraction 54% 4. Non invasive risk stratification*: Low  LHC 02/20/2011 - LV showed mild global hypokinesia, EF of approximately 50%. - Left main was patent. LAD has 10-15% proximal stenosis.  -Diagonal 1 has 20% ostial stenosis.Diagonal 2 is very small.  - Left circumflex has 40-50% proximal sequential stenosis. Stented midportion is patent.  - OM 1 and OM 2 were very, very small. OM 3 and 4 were small which were patent.  - RCA was patent at prior PTCA stenting site. Aortography showed no aortic aneurysm.  - Right renal artery has 60-70% ostial stenosis. Left renal artery is patent.     [name] denies symptoms worrisome for COVID 19.   Past Medical History:  Diagnosis Date  . Ambulates with cane    straight  . Angina    no current problems per patient at PAT appt 11/05/18  . Arthritis    HANDS"  . Asthma   . Cancer ([place]) 09/04/2017   Right Breast  . Carpal tunnel syndrome, bilateral 11/26/2016  . Cholelithiasis   . Cocaine abuse ([place]) 07/2012   per E.R. drug screen  . COPD (chronic obstructive pulmonary disease) (  [place])    per patient  . Coronary artery disease   . Diabetes mellitus without mention of complication    type 2  . Dysphagia    esophageal dysmotility on 03/2011 esophagram   . Dyspnea    with exertion  . Fatty liver disease, nonalcoholic 3154   on Imaging.   Marland Kitchen GERD (gastroesophageal reflux disease)   . Headache   . Hearing loss    bilateral - no hearing aids  . History of colonic polyps 2009   adenomatous 2009, HP in 2009, 2011, 2013.   Marland Kitchen Hyperlipidemia   . Hypertension   . Iron  deficiency anemia   . Myocardial infarction ([place])    years ago, 6 stents placed  . 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 Wears dentures    full upper and partial lower  . Wears glasses    Past Surgical History:  Procedure Laterality Date  . ABDOMINAL ANGIOGRAM  02/20/2011   Procedure: ABDOMINAL ANGIOGRAM;  Surgeon: Clent Demark, MD;  Location: [facility] CATH LAB;  Service: Cardiovascular;;  . CARPAL TUNNEL RELEASE Right 07/04/2017   Procedure: RIGHT CARPAL TUNNEL RELEASE;  Surgeon: Leanora Cover, MD;  Location: [place];  Service: Orthopedics;  Laterality: Right;  . CARPAL TUNNEL RELEASE Left 09/12/2017   Procedure: LEFT CARPAL TUNNEL RELEASE;  Surgeon: Leanora Cover, MD;  Location: [place];  Service: Orthopedics;  Laterality: Left;  . CHOLECYSTECTOMY N/A 11/11/2018   Procedure: ATTEMPTED LAPAROSCOPIC CHOLECUSTECOMY,  OPEN CHOLECYSTECTOMY;  Surgeon: Coralie Keens, MD;  Location: [place];  Service: General;  Laterality: N/A;  . COLONOSCOPY  11/30/2011   Procedure: COLONOSCOPY;  Surgeon: Lafayette Dragon, MD;  Location: [facility] ENDOSCOPY;  Service: Endoscopy;  Laterality: N/A;  . CORONARY ANGIOPLASTY WITH STENT PLACEMENT  02/20/2011  . ERCP N/A 10/03/2018   Procedure: ENDOSCOPIC RETROGRADE CHOLANGIOPANCREATOGRAPHY (ERCP);  Surgeon: Ladene Artist, MD;  Location: [facility];  Service: Endoscopy;  Laterality: N/A;  . ESOPHAGOGASTRODUODENOSCOPY  11/30/2011   Procedure: ESOPHAGOGASTRODUODENOSCOPY (EGD);  Surgeon: Lafayette Dragon, MD;  Location: [facility];  Service: Endoscopy;  Laterality: N/A;  . ESOPHAGOGASTRODUODENOSCOPY N/A 02/23/2015   Procedure: ESOPHAGOGASTRODUODENOSCOPY (EGD);  Surgeon: Gatha Mayer, MD;  Location: [facility] ENDOSCOPY;  Service: Endoscopy;  Laterality: N/A;  . EYE SURGERY Bilateral    cataracts removed  . INTRAOPERATIVE CHOLANGIOGRAM N/A 11/11/2018   Procedure: Intraoperative Cholangiogram;  Surgeon: Coralie Keens, MD;  Location: [place];   Service: General;  Laterality: N/A;  . LEFT HEART CATHETERIZATION WITH CORONARY ANGIOGRAM N/A 02/20/2011   Procedure: LEFT HEART CATHETERIZATION WITH CORONARY ANGIOGRAM;  Surgeon: Clent Demark, MD;  Location: [place] CATH LAB;  Service: Cardiovascular;  Laterality: N/A;  . MASTECTOMY Right 11/05/2017  . MASTECTOMY W/ SENTINEL NODE BIOPSY Right 11/27/2017  . MASTECTOMY W/ SENTINEL NODE BIOPSY Right 11/27/2017   Procedure: RIGHT TOTAL MASTECTOMY WITH SENTINEL LYMPH NODE BIOPSY;  Surgeon: Excell Seltzer, MD;  Location: [place];  Service: General;  Laterality: Right;  . PORT A CATH REVISION Left 05/07/2018   Procedure: PORT A CATH REVISION;  Surgeon: Excell Seltzer, MD;  Location: [facility];  Service: General;  Laterality: Left;  . PORTACATH PLACEMENT Left 11/27/2017   Procedure: INSERTION PORT-A-CATH;  Surgeon: Excell Seltzer, MD;  Location: [place];  Service: General;  Laterality: Left;  . RIGHT COLECTOMY  02/2007   for post polypectomy colonic perforation  . SPHINCTEROTOMY  10/03/2018   Procedure: SPHINCTEROTOMY;  Surgeon: Ladene Artist, MD;  Location: [facility] ENDOS[facility];  Service: Endoscopy;;  . TUBAL LIGATION       Current Outpatient Medications  Medication Sig Dispense Refill  . acetaminophen (TYLENOL) 325 MG tablet Take 2 tablets (650 mg total) by mouth every 4 (four) hours as needed for headache or mild pain. 60 tablet 1  . albuterol (PROVENTIL) (2.5 MG/3ML) 0.083% nebulizer solution Take 2.5 mg by nebulization every 6 (six) hours as needed for wheezing or shortness of breath.    . allopurinol (ZYLOPRIM) 300 MG tablet Take 300 mg by mouth every evening.     Marland Kitchen ALPRAZolam (XANAX) 0.25 MG tablet Take 0.25 mg by mouth at bedtime.     Marland Kitchen amLODipine (NORVASC) 5 MG tablet Take 5 mg by mouth daily.    Marland Kitchen anastrozole (ARIMIDEX) 1 MG tablet Take 1 tablet (1 mg total) by mouth daily. 90 tablet 3  . aspirin 81 MG EC tablet Take 1 tablet (81 mg total) by mouth daily. 30 tablet 0  . atorvastatin (LIPITOR)  20 MG tablet Take 20 mg by mouth daily at 6 PM.     . budesonide-formoterol (SYMBICORT) 160-4.5 MCG/ACT inhaler Inhale 1 puff into the lungs 2 (two) times daily as needed (respiratory issues.).     Marland Kitchen LANTUS SOLOSTAR 100 UNIT/ML Solostar Pen Inject 10 Units into the skin at bedtime.     . lidocaine (LIDODERM) 5 % 1 patch daily.    Marland Kitchen loperamide (IMODIUM) 2 MG capsule Take 1 capsule (2 mg total) by mouth 4 (four) times daily as needed for diarrhea or loose stools. 12 capsule 0  . magnesium oxide (MAG-OX) 400 (241.3 Mg) MG tablet Take 1 tablet (400 mg total) by mouth 2 (two) times daily. 60 tablet 0  . metoprolol succinate (TOPROL-XL) 50 MG 24 hr tablet Take 50 mg by mouth daily. Take with or immediately following a meal.    . Multiple Vitamin (MULTIVITAMIN WITH MINERALS) TABS tablet Take 1 tablet by mouth daily. Sentry    . nitroGLYCERIN (NITROSTAT) 0.4 MG SL tablet Place 0.4 mg under the tongue every 5 (five) minutes as needed for chest pain.    . Omega-3 Fatty Acids (FISH OIL) 1000 MG CAPS Take 1,000 mg by mouth daily.    . ondansetron (ZOFRAN) 8 MG tablet Take 1 tablet (8 mg total) by mouth every 8 (eight) hours as needed for nausea or vomiting. 20 tablet 0  . pantoprazole (PROTONIX) 40 MG tablet Take 40 mg by mouth daily.    . traMADol (ULTRAM) 50 MG tablet Take 1 tablet (50 mg total) by mouth every 6 (six) hours as needed for moderate pain. 30 tablet 0   No current facility-administered medications for this encounter.     Allergies:   Patient has no known allergies.   Social History:  The patient  reports that she quit smoking about 8 years ago. Her smoking use included cigarettes. She has a 23.50 pack-year smoking history. She has never used smokeless tobacco. She reports previous drug use. Drug: Cocaine. She reports that she does not drink alcohol.   Family History:  The patient's family history includes Cancer in her father; Diabetes in her mother and sister; Heart disease in her mother.    ROS:  Please see the history of present illness.   All other systems are personally reviewed and negative.   Exam:   General:  Elderly. No resp difficulty HEENT: normal Neck: supple. no JVD. Carotids 2+ bilat; no bruits. No lymphadenopathy or thryomegaly appreciated. Cor: PMI nondisplaced. Regular rate & rhythm.  No rubs, gallops or murmurs. Lungs: clear Abdomen: soft, nontender, nondistended. No hepatosplenomegaly. No bruits or masses. Good bowel sounds. Extremities: no cyanosis, clubbing, rash, edema Neuro: alert & orientedx3, cranial nerves grossly intact. moves all 4 extremities w/o difficulty. Affect pleasant  Recent Labs: 07/12/2018: B Natriuretic Peptide 25.3 10/06/2018: Magnesium 1.6 11/27/2018: ALT 24 12/09/2018: BUN 17; Creatinine, Ser 1.61; Potassium 4.0; Sodium 141 12/18/2018: Hemoglobin 11.6; Platelet Count 322  Personally reviewed   Wt Readings from Last 3 Encounters:  01/07/19 59.8 kg (131 lb 12.8 oz)  12/18/18 58.8 kg (129 lb 11.2 oz)  12/09/18 57.1 kg (125 lb 12.8 oz)      ASSESSMENT AND PLAN:  1. Malignant neoplasm of upper-outer quadrant of right breast in female, estrogen receptor positive ([place]) - She is on Herceptin. Did not tolerate Taxol due to malaise and paraesthesias per patient report.  - Echo 07/10/18 EF 50-55% GLS inaccurate - Echo 1/20  LVEF 55-60%, mild AI, Grade 1 DD, -16.4 - Echo 11/26/17 LVEF 50-55%, Grade 1 DD, Mild/mod AI, GLS - 14.55 - Echo today EF 50-55% Mild AI  - EF today lower than last echo but similar to baseline. Would be very unusual to develop Herceptin CM at this stage of her treatment. Suspect it is in acceptable range. Has only a few more treatments left. Will continue Hrrceptin and repeat echo in 3 months. Continue b-blocker. Strongly suggested avoidance of cocaine `.   2. CAD - She has chronic atypical chest pain. Recently seen in ER with negative w/u. .  - LHC 02/2011 with patent stents - 04/2016 Myoview with no reversible ischemia  or infarction.  - Continue ASA. Statin, b-blocker - follows with Dr. Terrence Dupont   4. Mild to Moderate AI - stable.AI mild on echo today.  Followed by Dr, Terrence Dupont   Signed, Glori Bickers, MD  01/07/2019 12:26 PM  [facility] [place] [address] [facility] [place] [address] [phone] (office) [phone] (fax)

## 2019-01-07 NOTE — Patient Instructions (Signed)
Your physician recommends that you schedule a follow-up appointment in: 3 months with echocardiogram  

## 2019-01-07 NOTE — Progress Notes (Signed)
  Echocardiogram 2D Echocardiogram has been performed.  [name] 01/07/2019, 11:58 AM

## 2019-01-08 ENCOUNTER — Other Ambulatory Visit: Payer: Self-pay

## 2019-01-08 ENCOUNTER — Inpatient Hospital Stay: Payer: Medicare Other | Attending: Oncology

## 2019-01-08 VITALS — BP 167/77 | HR 67 | Temp 98.3°F | Resp 16 | Ht 59.0 in | Wt 129.8 lb

## 2019-01-08 DIAGNOSIS — Z79899 Other long term (current) drug therapy: Secondary | ICD-10-CM | POA: Insufficient documentation

## 2019-01-08 DIAGNOSIS — C50411 Malignant neoplasm of upper-outer quadrant of right female breast: Secondary | ICD-10-CM | POA: Insufficient documentation

## 2019-01-08 DIAGNOSIS — Z5112 Encounter for antineoplastic immunotherapy: Secondary | ICD-10-CM | POA: Insufficient documentation

## 2019-01-08 DIAGNOSIS — Z170 Estrogen receptor positive status [ER+]: Secondary | ICD-10-CM | POA: Insufficient documentation

## 2019-01-08 MED ORDER — TRASTUZUMAB-ANNS CHEMO 150 MG IV SOLR
6.0000 mg/kg | Freq: Once | INTRAVENOUS | Status: AC
  Administered 2019-01-08: 357 mg via INTRAVENOUS
  Filled 2019-01-08: qty 17

## 2019-01-08 MED ORDER — DIPHENHYDRAMINE HCL 25 MG PO CAPS
50.0000 mg | ORAL_CAPSULE | Freq: Once | ORAL | Status: AC
  Administered 2019-01-08: 50 mg via ORAL

## 2019-01-08 MED ORDER — SODIUM CHLORIDE 0.9% FLUSH
10.0000 mL | INTRAVENOUS | Status: DC | PRN
  Administered 2019-01-08: 10 mL
  Filled 2019-01-08: qty 10

## 2019-01-08 MED ORDER — DIPHENHYDRAMINE HCL 25 MG PO CAPS
ORAL_CAPSULE | ORAL | Status: AC
  Filled 2019-01-08: qty 2

## 2019-01-08 MED ORDER — ACETAMINOPHEN 325 MG PO TABS
650.0000 mg | ORAL_TABLET | Freq: Once | ORAL | Status: AC
  Administered 2019-01-08: 14:00:00 650 mg via ORAL

## 2019-01-08 MED ORDER — SODIUM CHLORIDE 0.9 % IV SOLN
Freq: Once | INTRAVENOUS | Status: AC
  Administered 2019-01-08: 13:00:00 via INTRAVENOUS
  Filled 2019-01-08: qty 250

## 2019-01-08 MED ORDER — ACETAMINOPHEN 325 MG PO TABS
ORAL_TABLET | ORAL | Status: AC
  Filled 2019-01-08: qty 2

## 2019-01-08 MED ORDER — HEPARIN SOD (PORK) LOCK FLUSH 100 UNIT/ML IV SOLN
500.0000 [IU] | Freq: Once | INTRAVENOUS | Status: AC | PRN
  Administered 2019-01-08: 500 [IU]
  Filled 2019-01-08: qty 5

## 2019-01-08 NOTE — Patient Instructions (Signed)
Trastuzumab injection for infusion What is this medicine? TRASTUZUMAB (tras TOO zoo mab) is a monoclonal antibody. It is used to treat breast cancer and stomach cancer. This medicine may be used for other purposes; ask your health care provider or pharmacist if you have questions. COMMON BRAND NAME(S): Herceptin, Herzuma, KANJINTI, Ogivri, Ontruzant, Trazimera What should I tell my health care provider before I take this medicine? They need to know if you have any of these conditions:  heart disease  heart failure  lung or breathing disease, like asthma  an unusual or allergic reaction to trastuzumab, benzyl alcohol, or other medications, foods, dyes, or preservatives  pregnant or trying to get pregnant  breast-feeding How should I use this medicine? This drug is given as an infusion into a vein. It is administered in a hospital or clinic by a specially trained health care professional. Talk to your pediatrician regarding the use of this medicine in children. This medicine is not approved for use in children. Overdosage: If you think you have taken too much of this medicine contact a poison control center or emergency room at once. NOTE: This medicine is only for you. Do not share this medicine with others. What if I miss a dose? It is important not to miss a dose. Call your doctor or health care professional if you are unable to keep an appointment. What may interact with this medicine? This medicine may interact with the following medications:  certain types of chemotherapy, such as daunorubicin, doxorubicin, epirubicin, and idarubicin This list may not describe all possible interactions. Give your health care provider a list of all the medicines, herbs, non-prescription drugs, or dietary supplements you use. Also tell them if you smoke, drink alcohol, or use illegal drugs. Some items may interact with your medicine. What should I watch for while using this medicine? Visit your  doctor for checks on your progress. Report any side effects. Continue your course of treatment even though you feel ill unless your doctor tells you to stop. Call your doctor or health care professional for advice if you get a fever, chills or sore throat, or other symptoms of a cold or flu. Do not treat yourself. Try to avoid being around people who are sick. You may experience fever, chills and shaking during your first infusion. These effects are usually mild and can be treated with other medicines. Report any side effects during the infusion to your health care professional. Fever and chills usually do not happen with later infusions. Do not become pregnant while taking this medicine or for 7 months after stopping it. Women should inform their doctor if they wish to become pregnant or think they might be pregnant. Women of child-bearing potential will need to have a negative pregnancy test before starting this medicine. There is a potential for serious side effects to an unborn child. Talk to your health care professional or pharmacist for more information. Do not breast-feed an infant while taking this medicine or for 7 months after stopping it. Women must use effective birth control with this medicine. What side effects may I notice from receiving this medicine? Side effects that you should report to your doctor or health care professional as soon as possible:  allergic reactions like skin rash, itching or hives, swelling of the face, lips, or tongue  chest pain or palpitations  cough  dizziness  feeling faint or lightheaded, falls  fever  general ill feeling or flu-like symptoms  signs of worsening heart failure like   breathing problems; swelling in your legs and feet  unusually weak or tired Side effects that usually do not require medical attention (report to your doctor or health care professional if they continue or are bothersome):  bone pain  changes in  taste  diarrhea  joint pain  nausea/vomiting  weight loss This list may not describe all possible side effects. Call your doctor for medical advice about side effects. You may report side effects to FDA at 1-800-FDA-1088. Where should I keep my medicine? This drug is given in a hospital or clinic and will not be stored at home. NOTE: This sheet is a summary. It may not cover all possible information. If you have questions about this medicine, talk to your doctor, pharmacist, or health care provider.  2020 Elsevier/Gold Standard (2016-01-17 14:37:52)  Coronavirus (COVID-19) Are you at risk?  Are you at risk for the Coronavirus (COVID-19)?  To be considered HIGH RISK for Coronavirus (COVID-19), you have to meet the following criteria:  . Traveled to [place], [place], [place], [place] or [place]; or in the [place] to [place], [place], [place], or [place]; and have fever, cough, and shortness of breath within the last 2 weeks of travel OR . Been in close contact with a person diagnosed with COVID-19 within the last 2 weeks and have fever, cough, and shortness of breath . IF YOU DO NOT MEET THESE CRITERIA, YOU ARE CONSIDERED LOW RISK FOR COVID-19.  What to do if you are HIGH RISK for COVID-19?  . If you are having a medical emergency, call 911. . Seek medical care right away. Before you go to a doctor's office, urgent care or emergency department, call ahead and tell them about your recent travel, contact with someone diagnosed with COVID-19, and your symptoms. You should receive instructions from your physician's office regarding next steps of care.  . When you arrive at healthcare provider, tell the healthcare staff immediately you have returned from visiting [place], [place], [place], [place] or [place]; or traveled in the [place] to [place], [place], [place], or [place]; in the last two weeks or you have been in close contact with a person diagnosed with COVID-19  in the last 2 weeks.   . Tell the health care staff about your symptoms: fever, cough and shortness of breath. . After you have been seen by a medical provider, you will be either: o Tested for (COVID-19) and discharged home on quarantine except to seek medical care if symptoms worsen, and asked to  - Stay home and avoid contact with others until you get your results (4-5 days)  - Avoid travel on public transportation if possible (such as bus, train, or airplane) or o Sent to the Emergency Department by EMS for evaluation, COVID-19 testing, and possible admission depending on your condition and test results.  What to do if you are LOW RISK for COVID-19?  Reduce your risk of any infection by using the same precautions used for avoiding the common cold or flu:  . Wash your hands often with soap and warm water for at least 20 seconds.  If soap and water are not readily available, use an alcohol-based hand sanitizer with at least 60% alcohol.  . If coughing or sneezing, cover your mouth and nose by coughing or sneezing into the elbow areas of your shirt or coat, into a tissue or into your sleeve (not your hands). . Avoid shaking hands with others and consider head nods   or verbal greetings only. . Avoid touching your eyes, nose, or mouth with unwashed hands.  . Avoid close contact with people who are sick. . Avoid places or events with large numbers of people in one location, like concerts or sporting events. . Carefully consider travel plans you have or are making. . If you are planning any travel outside or inside the [place], visit the CDC's Travelers' Health webpage for the latest health notices. . If you have some symptoms but not all symptoms, continue to monitor at home and seek medical attention if your symptoms worsen. . If you are having a medical emergency, call 911.   ADDITIONAL HEALTHCARE OPTIONS FOR PATIENTS   Telehealth / e-Visit:  [email or web address]         [facility]: [phone]  [facility]: [phone]                   [facility]: [phone]   

## 2019-01-27 ENCOUNTER — Other Ambulatory Visit: Payer: Self-pay | Admitting: *Deleted

## 2019-01-27 ENCOUNTER — Telehealth: Payer: Self-pay | Admitting: Hematology and Oncology

## 2019-01-27 DIAGNOSIS — C50411 Malignant neoplasm of upper-outer quadrant of right female breast: Secondary | ICD-10-CM

## 2019-01-27 DIAGNOSIS — Z170 Estrogen receptor positive status [ER+]: Secondary | ICD-10-CM

## 2019-01-27 NOTE — Telephone Encounter (Signed)
R/s appt per 12/22 sch message - pt aware of apt date and time . And email sent to transportation about appt time change

## 2019-01-28 NOTE — Progress Notes (Signed)
Patient Care Team: Tsosie Billing, MD (Inactive) as PCP - General (Internal Medicine)  DIAGNOSIS:    ICD-10-CM   1. Malignant neoplasm of upper-outer quadrant of right breast in female, estrogen receptor positive ([place])  C50.411    Z17.0     SUMMARY OF ONCOLOGIC HISTORY: Oncology History  Malignant neoplasm of upper-outer quadrant of right breast in female, estrogen receptor positive ([place])  09/03/2017 Initial Diagnosis   Screening mammogram detected 1.4 cm right breast mass at 10 o'clock position, additional pleomorphic calcifications 2 cm posteriorly, additional loosely grouped calcifications 10.1 x 7.1 x 5.3 cm, single right axillary lymph node: Biopsy revealed DCIS ER 100%, PR 50%; IDC grade 2-3 ER 100%, PR 50%, Ki-67 15%, HER-2 positive ratio 2, copy #5, lymph node negative T2 N0 stage Ia clinical stage AJCC 8   09/03/2017 Cancer Staging   Staging form: Breast, AJCC 8th Edition - Clinical stage from 09/03/2017: Stage IB (cT2, cN0, cM0, G3, ER+, PR+, HER2+) - Signed by Gardenia Phlegm, NP on 04/10/2018   11/27/2017 Surgery   Right mastectomy: IDC grade 2, 1.6 cm, separate foci of DCIS intermediate grade, margins negative, 0/5 lymph nodes negative,ER 100%, PR 50%, Ki-67 15%, HER-2 positive ratio 2, copy #5 T1c N0 stage Ia   11/27/2017 Cancer Staging   Staging form: Breast, AJCC 8th Edition - Pathologic stage from 11/27/2017: Stage IA (pT1c, pN0, cM0, G2, ER+, PR+, HER2+) - Signed by Gardenia Phlegm, NP on 04/10/2018   02/07/2018 -  Chemotherapy   The patient had trastuzumab (HERCEPTIN) 483 mg in sodium chloride 0.9 % 250 mL chemo infusion, 8 mg/kg = 483 mg, Intravenous,  Once, 11 of 11 cycles Administration: 483 mg (02/07/2018), 357 mg (02/27/2018), 357 mg (05/01/2018), 357 mg (05/23/2018), 357 mg (06/12/2018), 357 mg (07/03/2018), 357 mg (08/14/2018), 357 mg (09/04/2018), 357 mg (10/17/2018), 357 mg (03/20/2018), 357 mg (04/10/2018) trastuzumab-anns (KANJINTI) 357 mg in sodium  chloride 0.9 % 250 mL chemo infusion, 6 mg/kg = 357 mg (100 % of original dose 6 mg/kg), Intravenous,  Once, 4 of 7 cycles Dose modification: 6 mg/kg (original dose 6 mg/kg, Cycle 12, Reason: Other (see comments)) Administration: 357 mg (11/06/2018), 357 mg (11/27/2018), 357 mg (12/18/2018), 357 mg (01/08/2019)  for chemotherapy treatment.    02/27/2018 -  Anti-estrogen oral therapy   Anastrozole 3m daily     CHIEF COMPLIANT: Follow-up of right breast cancer on Herceptin with anastrozole  INTERVAL HISTORY: Dominique KNUEPPELis a 68y.o. with above-mentioned history of right breast cancer whounderwent a right mastectomy and is currently on Herceptin maintenance and anti-estrogen therapy withanastrozole. Echo on 01/07/19 showed an ejection fraction of 50-55%. She presents to the clinic today for treatment.   REVIEW OF SYSTEMS:   Constitutional: Denies fevers, chills or abnormal weight loss Eyes: Denies blurriness of vision Ears, nose, mouth, throat, and face: Denies mucositis or sore throat Respiratory: Denies cough, dyspnea or wheezes Cardiovascular: Denies palpitation, chest discomfort Gastrointestinal: Denies nausea, heartburn or change in bowel habits Skin: Denies abnormal skin rashes Lymphatics: Denies new lymphadenopathy or easy bruising Neurological: Denies numbness, tingling or new weaknesses Behavioral/Psych: Mood is stable, no new changes  Extremities: No lower extremity edema Breast: denies any pain or lumps or nodules in either breasts All other systems were reviewed with the patient and are negative.  I have reviewed the past medical history, past surgical history, social history and family history with the patient and they are unchanged from previous note.  ALLERGIES:  has No  Known Allergies.  MEDICATIONS:  Current Outpatient Medications  Medication Sig Dispense Refill  . acetaminophen (TYLENOL) 325 MG tablet Take 2 tablets (650 mg total) by mouth every 4 (four) hours as  needed for headache or mild pain. 60 tablet 1  . albuterol (PROVENTIL) (2.5 MG/3ML) 0.083% nebulizer solution Take 2.5 mg by nebulization every 6 (six) hours as needed for wheezing or shortness of breath.    . allopurinol (ZYLOPRIM) 300 MG tablet Take 300 mg by mouth every evening.     Marland Kitchen ALPRAZolam (XANAX) 0.25 MG tablet Take 0.25 mg by mouth at bedtime.     Marland Kitchen amLODipine (NORVASC) 5 MG tablet Take 5 mg by mouth daily.    Marland Kitchen anastrozole (ARIMIDEX) 1 MG tablet Take 1 tablet (1 mg total) by mouth daily. 90 tablet 3  . aspirin 81 MG EC tablet Take 1 tablet (81 mg total) by mouth daily. 30 tablet 0  . atorvastatin (LIPITOR) 20 MG tablet Take 20 mg by mouth daily at 6 PM.     . budesonide-formoterol (SYMBICORT) 160-4.5 MCG/ACT inhaler Inhale 1 puff into the lungs 2 (two) times daily as needed (respiratory issues.).     Marland Kitchen LANTUS SOLOSTAR 100 UNIT/ML Solostar Pen Inject 10 Units into the skin at bedtime.     . lidocaine (LIDODERM) 5 % 1 patch daily.    Marland Kitchen loperamide (IMODIUM) 2 MG capsule Take 1 capsule (2 mg total) by mouth 4 (four) times daily as needed for diarrhea or loose stools. 12 capsule 0  . magnesium oxide (MAG-OX) 400 (241.3 Mg) MG tablet Take 1 tablet (400 mg total) by mouth 2 (two) times daily. 60 tablet 0  . metoprolol succinate (TOPROL-XL) 50 MG 24 hr tablet Take 50 mg by mouth daily. Take with or immediately following a meal.    . Multiple Vitamin (MULTIVITAMIN WITH MINERALS) TABS tablet Take 1 tablet by mouth daily. [place]entry    . nitroGLYCERIN (NITROSTAT) 0.4 MG SL tablet Place 0.4 mg under the tongue every 5 (five) minutes as needed for chest pain.    . Omega-3 Fatty Acids (FISH OIL) 1000 MG CAPS Take 1,000 mg by mouth daily.    . ondansetron (ZOFRAN) 8 MG tablet Take 1 tablet (8 mg total) by mouth every 8 (eight) hours as needed for nausea or vomiting. 20 tablet 0  . pantoprazole (PROTONIX) 40 MG tablet Take 40 mg by mouth daily.    . traMADol (ULTRAM) 50 MG tablet Take 1 tablet (50 mg  total) by mouth every 6 (six) hours as needed for moderate pain. 30 tablet 0   No current facility-administered medications for this visit.    PHYSICAL EXAMINATION: ECOG PERFORMANCE STATUS: 1 - Symptomatic but completely ambulatory  Vitals:   01/29/19 1056  BP: 137/70  Pulse: 66  Resp: 16  Temp: 98.3 F (36.8 C)  SpO2: 100%   Filed Weights   01/29/19 1056  Weight: 132 lb 4.8 oz (60 kg)    GENERAL: alert, no distress and comfortable SKIN: skin color, texture, turgor are normal, no rashes or significant lesions EYES: normal, Conjunctiva are pink and non-injected, sclera clear OROPHARYNX: no exudate, no erythema and lips, buccal mucosa, and tongue normal  NECK: supple, thyroid normal size, non-tender, without nodularity LYMPH: no palpable lymphadenopathy in the cervical, axillary or inguinal LUNGS: clear to auscultation and percussion with normal breathing effort HEART: regular rate & rhythm and no murmurs and no lower extremity edema ABDOMEN: abdomen soft, non-tender and normal bowel sounds MUSCULOSKELETAL: no cyanosis  of digits and no clubbing  NEURO: alert & oriented x 3 with fluent speech, no focal motor/sensory deficits EXTREMITIES: No lower extremity edema  LABORATORY DATA:  I have reviewed the data as listed CMP Latest Ref Rng & Units 12/09/2018 12/08/2018 11/27/2018  Glucose 70 - 99 mg/dL 105(H) 109(H) 99  BUN 8 - 23 mg/dL _0 Creatinine 0.44 - 1.00 mg/dL 1.61(H) 1.80(H) 1.91(H)  Sodium 135 - 145 mmol/L 141 140 141  Potassium 3.5 - 5.1 mmol/L 4.0 4.4 4.6  Chloride 98 - 111 mmol/L 109 106 104  CO2 22 - 32 mmol/L _1 Calcium 8.9 - 10.3 mg/dL 9.7 10.0 10.6(H)  Total Protein 6.5 - 8.1 g/dL - - 8.8(H)  Total Bilirubin 0.3 - 1.2 mg/dL - - 0.4  Alkaline Phos 38 - 126 U/L - - 202(H)  AST 15 - 41 U/L - - 20  ALT 0 - 44 U/L - - 24    Lab Results  Component Value Date   WBC 8.4 12/18/2018   HGB 11.6 (L) 12/18/2018   HCT 37.5 12/18/2018   MCV 92.4  12/18/2018   PLT 322 12/18/2018   NEUTROABS 3.9 12/18/2018    ASSESSMENT & PLAN:  Malignant neoplasm of upper-outer quadrant of right breast in female, estrogen receptor positive ([place]) 11/27/2017 right mastectomy: IDC grade 2, 1.6 cm, separate foci of DCIS intermediate grade, margins negative, 0/5 lymph nodes negative,ER 100%, PR 50%, Ki-67 15%, HER-2 positive ratio 2, copy #5 T1c N0 stage Ia  Treatment plan: 1. Adjuvant Herceptin every 3 weeks for 1 year (because of poor performance status Taxol is felt to be intolerable for her), started January 2020 2. Adjuvant antiestrogen therapy with letrozole 2.5 mg daily  Patient's daughter is the primary point of contact for her and all appointment changes will need to go through her. -------------------------------------------------------------------------------------- Hospitalization 10/02/2018-10/07/2018: Acute cholecystitis Hospitalization: 11/11/2018 through 11/18/2018: Chronic cholecystitis with gallstones: Cholecystectomy Hospitalization: 12/07/2020 12/09/2018: Chest pain, VQ scan negative for PE, the predictable chest pain treated with Lidoderm patch  Herceptin toxicities: None Severe anemia: Hemoglobin improved 11.6 on 12/18/2018  Return to clinic every 3 weeks for Herceptin every 6 weeksforfollow-up with me.  She has 2 more doses of Herceptin left.    No orders of the defined types were placed in this encounter.  The patient has a good understanding of the overall plan. she agrees with it. she will call with any problems that may develop before the next visit here.  Nicholas Lose, MD 01/29/2019  Julious Oka Dorshimer, am acting as scribe for Dr. Nicholas Lose.  I have reviewed the above document for accuracy and completeness, and I agree with the above.

## 2019-01-29 ENCOUNTER — Inpatient Hospital Stay: Payer: Medicare Other

## 2019-01-29 ENCOUNTER — Inpatient Hospital Stay: Payer: Medicare Other | Admitting: Hematology and Oncology

## 2019-01-29 ENCOUNTER — Inpatient Hospital Stay (HOSPITAL_BASED_OUTPATIENT_CLINIC_OR_DEPARTMENT_OTHER): Payer: Medicare Other | Admitting: Hematology and Oncology

## 2019-01-29 ENCOUNTER — Other Ambulatory Visit: Payer: Self-pay

## 2019-01-29 DIAGNOSIS — Z170 Estrogen receptor positive status [ER+]: Secondary | ICD-10-CM

## 2019-01-29 DIAGNOSIS — C50411 Malignant neoplasm of upper-outer quadrant of right female breast: Secondary | ICD-10-CM

## 2019-01-29 DIAGNOSIS — Z5112 Encounter for antineoplastic immunotherapy: Secondary | ICD-10-CM | POA: Diagnosis not present

## 2019-01-29 DIAGNOSIS — Z95828 Presence of other vascular implants and grafts: Secondary | ICD-10-CM

## 2019-01-29 LAB — CBC WITH DIFFERENTIAL (CANCER CENTER ONLY)
Abs Immature Granulocytes: 0.07 10*3/uL (ref 0.00–0.07)
Basophils Absolute: 0 10*3/uL (ref 0.0–0.1)
Basophils Relative: 1 %
Eosinophils Absolute: 0.2 10*3/uL (ref 0.0–0.5)
Eosinophils Relative: 3 %
HCT: 40.4 % (ref 36.0–46.0)
Hemoglobin: 12.6 g/dL (ref 12.0–15.0)
Immature Granulocytes: 1 %
Lymphocytes Relative: 27 %
Lymphs Abs: 2.1 10*3/uL (ref 0.7–4.0)
MCH: 28 pg (ref 26.0–34.0)
MCHC: 31.2 g/dL (ref 30.0–36.0)
MCV: 89.8 fL (ref 80.0–100.0)
Monocytes Absolute: 0.6 10*3/uL (ref 0.1–1.0)
Monocytes Relative: 8 %
Neutro Abs: 4.9 10*3/uL (ref 1.7–7.7)
Neutrophils Relative %: 60 %
Platelet Count: 284 10*3/uL (ref 150–400)
RBC: 4.5 MIL/uL (ref 3.87–5.11)
RDW: 15.8 % — ABNORMAL HIGH (ref 11.5–15.5)
WBC Count: 8 10*3/uL (ref 4.0–10.5)
nRBC: 0 % (ref 0.0–0.2)

## 2019-01-29 LAB — CMP (CANCER CENTER ONLY)
ALT: 18 U/L (ref 0–44)
AST: 16 U/L (ref 15–41)
Albumin: 3.9 g/dL (ref 3.5–5.0)
Alkaline Phosphatase: 120 U/L (ref 38–126)
Anion gap: 10 (ref 5–15)
BUN: 33 mg/dL — ABNORMAL HIGH (ref 8–23)
CO2: 24 mmol/L (ref 22–32)
Calcium: 9.4 mg/dL (ref 8.9–10.3)
Chloride: 102 mmol/L (ref 98–111)
Creatinine: 2.25 mg/dL — ABNORMAL HIGH (ref 0.44–1.00)
GFR, Est AFR Am: 25 mL/min — ABNORMAL LOW (ref >60)
GFR, Estimated: 21 mL/min — ABNORMAL LOW (ref >60)
Glucose, Bld: 254 mg/dL — ABNORMAL HIGH (ref 70–99)
Potassium: 4.8 mmol/L (ref 3.5–5.1)
Sodium: 136 mmol/L (ref 135–145)
Total Bilirubin: 0.6 mg/dL (ref 0.3–1.2)
Total Protein: 8.5 g/dL — ABNORMAL HIGH (ref 6.5–8.1)

## 2019-01-29 MED ORDER — DIPHENHYDRAMINE HCL 25 MG PO CAPS
ORAL_CAPSULE | ORAL | Status: AC
  Filled 2019-01-29: qty 2

## 2019-01-29 MED ORDER — ACETAMINOPHEN 325 MG PO TABS
ORAL_TABLET | ORAL | Status: AC
  Filled 2019-01-29: qty 2

## 2019-01-29 MED ORDER — TRASTUZUMAB-ANNS CHEMO 150 MG IV SOLR
6.0000 mg/kg | Freq: Once | INTRAVENOUS | Status: AC
  Administered 2019-01-29: 357 mg via INTRAVENOUS
  Filled 2019-01-29: qty 17

## 2019-01-29 MED ORDER — DIPHENHYDRAMINE HCL 25 MG PO CAPS
50.0000 mg | ORAL_CAPSULE | Freq: Once | ORAL | Status: AC
  Administered 2019-01-29: 50 mg via ORAL

## 2019-01-29 MED ORDER — SODIUM CHLORIDE 0.9% FLUSH
10.0000 mL | INTRAVENOUS | Status: DC | PRN
  Administered 2019-01-29: 10 mL
  Filled 2019-01-29: qty 10

## 2019-01-29 MED ORDER — HEPARIN SOD (PORK) LOCK FLUSH 100 UNIT/ML IV SOLN
500.0000 [IU] | Freq: Once | INTRAVENOUS | Status: AC | PRN
  Administered 2019-01-29: 500 [IU]
  Filled 2019-01-29: qty 5

## 2019-01-29 MED ORDER — SODIUM CHLORIDE 0.9 % IV SOLN
Freq: Once | INTRAVENOUS | Status: AC
  Filled 2019-01-29: qty 250

## 2019-01-29 MED ORDER — ACETAMINOPHEN 325 MG PO TABS
650.0000 mg | ORAL_TABLET | Freq: Once | ORAL | Status: AC
  Administered 2019-01-29: 650 mg via ORAL

## 2019-01-29 MED ORDER — SODIUM CHLORIDE 0.9% FLUSH
10.0000 mL | INTRAVENOUS | Status: DC | PRN
  Filled 2019-01-29: qty 10

## 2019-01-29 NOTE — Patient Instructions (Signed)
[facility] °Discharge Instructions for Patients Receiving Chemotherapy ° °Today you received the following chemotherapy agents Trastuzumab ° °To help prevent nausea and vomiting after your treatment, we encourage you to take your nausea medication as directed. °  °If you develop nausea and vomiting that is not controlled by your nausea medication, call the clinic.  ° °BELOW ARE SYMPTOMS THAT SHOULD BE REPORTED IMMEDIATELY: °· *FEVER GREATER THAN 100.5 F °· *CHILLS WITH OR WITHOUT FEVER °· NAUSEA AND VOMITING THAT IS NOT CONTROLLED WITH YOUR NAUSEA MEDICATION °· *UNUSUAL SHORTNESS OF BREATH °· *UNUSUAL BRUISING OR BLEEDING °· TENDERNESS IN MOUTH AND THROAT WITH OR WITHOUT PRESENCE OF ULCERS °· *URINARY PROBLEMS °· *BOWEL PROBLEMS °· UNUSUAL RASH °Items with * indicate a potential emergency and should be followed up as soon as possible. ° °Feel free to call the clinic should you have any questions or concerns. The clinic phone number is [phone]. ° °Please show the CHEMO ALERT CARD at check-in to the Emergency Department and triage nurse. ° ° °

## 2019-01-29 NOTE — Patient Instructions (Signed)

## 2019-01-29 NOTE — Assessment & Plan Note (Signed)
11/27/2017 right mastectomy: IDC grade 2, 1.6 cm, separate foci of DCIS intermediate grade, margins negative, 0/5 lymph nodes negative,ER 100%, PR 50%, Ki-67 15%, HER-2 positive ratio 2, copy #5 T1c N0 stage Ia  Treatment plan: 1. Adjuvant Herceptin every 3 weeks for 1 year (because of poor performance status Taxol is felt to be intolerable for her), started January 2020 2. Adjuvant antiestrogen therapy with letrozole 2.5 mg daily  Patient's daughter is the primary point of contact for her and all appointment changes will need to go through her. -------------------------------------------------------------------------------------- Hospitalization 10/02/2018-10/07/2018: Acute cholecystitis Hospitalization: 11/11/2018 through 11/18/2018: Chronic cholecystitis with gallstones: Cholecystectomy Hospitalization: 12/07/2020 12/09/2018: Chest pain, VQ scan negative for PE, the predictable chest pain treated with Lidoderm patch  Herceptin toxicities: None Severe anemia: Hemoglobin improved 11.6 on 12/18/2018  Return to clinic every 3 weeks for Herceptin every 6 weeksforfollow-up with me.  She has 2 more doses of Herceptin left.

## 2019-02-04 ENCOUNTER — Encounter: Payer: Self-pay | Admitting: *Deleted

## 2019-02-04 NOTE — Progress Notes (Signed)
[place] Work  Clinical Social Work received referral from Cytogeneticist for additional financial resources.  CSW contacted patient at home to offer support and assess for needs.  Patient reported her stove has broken, but she was planning to go to a used store to find a replacement.  CSW explored additional resources with patient including habitat restore.  CSW also encouraged patient to see CSW at her next [facility] appointment to apply for the sher[employer] fund.    Johnnye Lana, MSW, LCSW, OSW-C Clinical Social Worker [facility] [phone]

## 2019-02-19 ENCOUNTER — Other Ambulatory Visit: Payer: Self-pay

## 2019-02-19 ENCOUNTER — Inpatient Hospital Stay: Payer: Medicare Other | Attending: Oncology

## 2019-02-19 VITALS — BP 168/70 | HR 67 | Temp 97.6°F | Resp 18

## 2019-02-19 DIAGNOSIS — C50411 Malignant neoplasm of upper-outer quadrant of right female breast: Secondary | ICD-10-CM | POA: Diagnosis present

## 2019-02-19 DIAGNOSIS — Z170 Estrogen receptor positive status [ER+]: Secondary | ICD-10-CM | POA: Insufficient documentation

## 2019-02-19 DIAGNOSIS — Z5112 Encounter for antineoplastic immunotherapy: Secondary | ICD-10-CM | POA: Insufficient documentation

## 2019-02-19 MED ORDER — SODIUM CHLORIDE 0.9 % IV SOLN
Freq: Once | INTRAVENOUS | Status: AC
  Filled 2019-02-19: qty 250

## 2019-02-19 MED ORDER — ACETAMINOPHEN 325 MG PO TABS
650.0000 mg | ORAL_TABLET | Freq: Once | ORAL | Status: AC
  Administered 2019-02-19: 14:00:00 650 mg via ORAL

## 2019-02-19 MED ORDER — SODIUM CHLORIDE 0.9% FLUSH
10.0000 mL | INTRAVENOUS | Status: DC | PRN
  Administered 2019-02-19: 15:00:00 10 mL
  Filled 2019-02-19: qty 10

## 2019-02-19 MED ORDER — DIPHENHYDRAMINE HCL 25 MG PO CAPS
50.0000 mg | ORAL_CAPSULE | Freq: Once | ORAL | Status: AC
  Administered 2019-02-19: 14:00:00 50 mg via ORAL

## 2019-02-19 MED ORDER — TRASTUZUMAB-ANNS CHEMO 150 MG IV SOLR
6.0000 mg/kg | Freq: Once | INTRAVENOUS | Status: AC
  Administered 2019-02-19: 15:00:00 357 mg via INTRAVENOUS
  Filled 2019-02-19: qty 17

## 2019-02-19 MED ORDER — HEPARIN SOD (PORK) LOCK FLUSH 100 UNIT/ML IV SOLN
500.0000 [IU] | Freq: Once | INTRAVENOUS | Status: AC | PRN
  Administered 2019-02-19: 15:00:00 500 [IU]
  Filled 2019-02-19: qty 5

## 2019-02-19 MED ORDER — DIPHENHYDRAMINE HCL 25 MG PO CAPS
ORAL_CAPSULE | ORAL | Status: AC
  Filled 2019-02-19: qty 2

## 2019-02-19 MED ORDER — ACETAMINOPHEN 325 MG PO TABS
ORAL_TABLET | ORAL | Status: AC
  Filled 2019-02-19: qty 2

## 2019-02-19 NOTE — Patient Instructions (Signed)
[facility] °Discharge Instructions for Patients Receiving Chemotherapy ° °Today you received the following chemotherapy agents Trastuzumab ° °To help prevent nausea and vomiting after your treatment, we encourage you to take your nausea medication as directed. °  °If you develop nausea and vomiting that is not controlled by your nausea medication, call the clinic.  ° °BELOW ARE SYMPTOMS THAT SHOULD BE REPORTED IMMEDIATELY: °· *FEVER GREATER THAN 100.5 F °· *CHILLS WITH OR WITHOUT FEVER °· NAUSEA AND VOMITING THAT IS NOT CONTROLLED WITH YOUR NAUSEA MEDICATION °· *UNUSUAL SHORTNESS OF BREATH °· *UNUSUAL BRUISING OR BLEEDING °· TENDERNESS IN MOUTH AND THROAT WITH OR WITHOUT PRESENCE OF ULCERS °· *URINARY PROBLEMS °· *BOWEL PROBLEMS °· UNUSUAL RASH °Items with * indicate a potential emergency and should be followed up as soon as possible. ° °Feel free to call the clinic should you have any questions or concerns. The clinic phone number is [phone]. ° °Please show the CHEMO ALERT CARD at check-in to the Emergency Department and triage nurse. ° ° °

## 2019-03-11 ENCOUNTER — Other Ambulatory Visit: Payer: Self-pay | Admitting: *Deleted

## 2019-03-11 DIAGNOSIS — R1013 Epigastric pain: Secondary | ICD-10-CM

## 2019-03-11 DIAGNOSIS — C50811 Malignant neoplasm of overlapping sites of right female breast: Secondary | ICD-10-CM

## 2019-03-12 ENCOUNTER — Inpatient Hospital Stay (HOSPITAL_BASED_OUTPATIENT_CLINIC_OR_DEPARTMENT_OTHER): Payer: Medicare Other | Admitting: Adult Health

## 2019-03-12 ENCOUNTER — Other Ambulatory Visit: Payer: Self-pay

## 2019-03-12 ENCOUNTER — Inpatient Hospital Stay: Payer: Medicare Other | Attending: Oncology

## 2019-03-12 ENCOUNTER — Encounter: Payer: Self-pay | Admitting: Adult Health

## 2019-03-12 ENCOUNTER — Inpatient Hospital Stay: Payer: Medicare Other

## 2019-03-12 VITALS — BP 154/95 | HR 70 | Temp 99.1°F | Resp 20 | Ht 59.0 in | Wt 132.6 lb

## 2019-03-12 DIAGNOSIS — Z5112 Encounter for antineoplastic immunotherapy: Secondary | ICD-10-CM | POA: Diagnosis not present

## 2019-03-12 DIAGNOSIS — C50411 Malignant neoplasm of upper-outer quadrant of right female breast: Secondary | ICD-10-CM

## 2019-03-12 DIAGNOSIS — C50811 Malignant neoplasm of overlapping sites of right female breast: Secondary | ICD-10-CM | POA: Diagnosis not present

## 2019-03-12 DIAGNOSIS — Z170 Estrogen receptor positive status [ER+]: Secondary | ICD-10-CM

## 2019-03-12 DIAGNOSIS — Z79899 Other long term (current) drug therapy: Secondary | ICD-10-CM | POA: Diagnosis not present

## 2019-03-12 DIAGNOSIS — Z95828 Presence of other vascular implants and grafts: Secondary | ICD-10-CM

## 2019-03-12 LAB — CBC WITH DIFFERENTIAL (CANCER CENTER ONLY)
Abs Immature Granulocytes: 0.05 10*3/uL (ref 0.00–0.07)
Basophils Absolute: 0 10*3/uL (ref 0.0–0.1)
Basophils Relative: 0 %
Eosinophils Absolute: 0.1 10*3/uL (ref 0.0–0.5)
Eosinophils Relative: 2 %
HCT: 40.3 % (ref 36.0–46.0)
Hemoglobin: 12.7 g/dL (ref 12.0–15.0)
Immature Granulocytes: 1 %
Lymphocytes Relative: 30 %
Lymphs Abs: 2 10*3/uL (ref 0.7–4.0)
MCH: 29.5 pg (ref 26.0–34.0)
MCHC: 31.5 g/dL (ref 30.0–36.0)
MCV: 93.5 fL (ref 80.0–100.0)
Monocytes Absolute: 0.6 10*3/uL (ref 0.1–1.0)
Monocytes Relative: 9 %
Neutro Abs: 4 10*3/uL (ref 1.7–7.7)
Neutrophils Relative %: 58 %
Platelet Count: 268 10*3/uL (ref 150–400)
RBC: 4.31 MIL/uL (ref 3.87–5.11)
RDW: 15.2 % (ref 11.5–15.5)
WBC Count: 6.8 10*3/uL (ref 4.0–10.5)
nRBC: 0 % (ref 0.0–0.2)

## 2019-03-12 MED ORDER — DIPHENHYDRAMINE HCL 25 MG PO CAPS
50.0000 mg | ORAL_CAPSULE | Freq: Once | ORAL | Status: AC
  Administered 2019-03-12: 50 mg via ORAL

## 2019-03-12 MED ORDER — ACETAMINOPHEN 325 MG PO TABS
650.0000 mg | ORAL_TABLET | Freq: Once | ORAL | Status: AC
  Administered 2019-03-12: 650 mg via ORAL

## 2019-03-12 MED ORDER — SODIUM CHLORIDE 0.9% FLUSH
10.0000 mL | INTRAVENOUS | Status: DC | PRN
  Administered 2019-03-12: 10 mL
  Filled 2019-03-12: qty 10

## 2019-03-12 MED ORDER — TRASTUZUMAB-ANNS CHEMO 150 MG IV SOLR
6.0000 mg/kg | Freq: Once | INTRAVENOUS | Status: AC
  Administered 2019-03-12: 357 mg via INTRAVENOUS
  Filled 2019-03-12: qty 17

## 2019-03-12 MED ORDER — SODIUM CHLORIDE 0.9 % IV SOLN
Freq: Once | INTRAVENOUS | Status: AC
  Filled 2019-03-12: qty 250

## 2019-03-12 MED ORDER — HEPARIN SOD (PORK) LOCK FLUSH 100 UNIT/ML IV SOLN
500.0000 [IU] | Freq: Once | INTRAVENOUS | Status: AC | PRN
  Administered 2019-03-12: 500 [IU]
  Filled 2019-03-12: qty 5

## 2019-03-12 MED ORDER — DIPHENHYDRAMINE HCL 25 MG PO CAPS
ORAL_CAPSULE | ORAL | Status: AC
  Filled 2019-03-12: qty 2

## 2019-03-12 MED ORDER — ACETAMINOPHEN 325 MG PO TABS
ORAL_TABLET | ORAL | Status: AC
  Filled 2019-03-12: qty 2

## 2019-03-12 NOTE — Progress Notes (Signed)
[facility] Follow up:    Tsosie Billing, MD (Inactive) [place] [address]   DIAGNOSIS: Cancer Staging Malignant neoplasm of upper-outer quadrant of right breast in female, estrogen receptor positive ([place]) Staging form: Breast, AJCC 8th Edition - Clinical stage from 09/03/2017: Stage IB (cT2, cN0, cM0, G3, ER+, PR+, HER2+) - Signed by Gardenia Phlegm, NP on 04/10/2018 - Pathologic stage from 11/27/2017: Stage IA (pT1c, pN0, cM0, G2, ER+, PR+, HER2+) - Signed by Gardenia Phlegm, NP on 04/10/2018   SUMMARY OF ONCOLOGIC HISTORY: Oncology History  Malignant neoplasm of upper-outer quadrant of right breast in female, estrogen receptor positive ([place])  09/03/2017 Initial Diagnosis   Screening mammogram detected 1.4 cm right breast mass at 10 o'clock position, additional pleomorphic calcifications 2 cm posteriorly, additional loosely grouped calcifications 10.1 x 7.1 x 5.3 cm, single right axillary lymph node: Biopsy revealed DCIS ER 100%, PR 50%; IDC grade 2-3 ER 100%, PR 50%, Ki-67 15%, HER-2 positive ratio 2, copy #5, lymph node negative T2 N0 stage Ia clinical stage AJCC 8   09/03/2017 Cancer Staging   Staging form: Breast, AJCC 8th Edition - Clinical stage from 09/03/2017: Stage IB (cT2, cN0, cM0, G3, ER+, PR+, HER2+) - Signed by Gardenia Phlegm, NP on 04/10/2018   11/27/2017 Surgery   Right mastectomy: IDC grade 2, 1.6 cm, separate foci of DCIS intermediate grade, margins negative, 0/5 lymph nodes negative,ER 100%, PR 50%, Ki-67 15%, HER-2 positive ratio 2, copy #5 T1c N0 stage Ia   11/27/2017 Cancer Staging   Staging form: Breast, AJCC 8th Edition - Pathologic stage from 11/27/2017: Stage IA (pT1c, pN0, cM0, G2, ER+, PR+, HER2+) - Signed by Gardenia Phlegm, NP on 04/10/2018   02/07/2018 -  Chemotherapy   The patient had trastuzumab (HERCEPTIN) 483 mg in sodium chloride 0.9 % 250 mL chemo infusion, 8 mg/kg = 483 mg,  Intravenous,  Once, 11 of 11 cycles Administration: 483 mg (02/07/2018), 357 mg (02/27/2018), 357 mg (05/01/2018), 357 mg (05/23/2018), 357 mg (06/12/2018), 357 mg (07/03/2018), 357 mg (08/14/2018), 357 mg (09/04/2018), 357 mg (10/17/2018), 357 mg (03/20/2018), 357 mg (04/10/2018) trastuzumab-anns (KANJINTI) 357 mg in sodium chloride 0.9 % 250 mL chemo infusion, 6 mg/kg = 357 mg (100 % of original dose 6 mg/kg), Intravenous,  Once, 6 of 7 cycles Dose modification: 6 mg/kg (original dose 6 mg/kg, Cycle 12, Reason: Other (see comments)) Administration: 357 mg (11/06/2018), 357 mg (11/27/2018), 357 mg (12/18/2018), 357 mg (01/08/2019), 357 mg (01/29/2019), 357 mg (02/19/2019)  for chemotherapy treatment.    02/27/2018 -  Anti-estrogen oral therapy   Anastrozole '1mg'$  daily     CURRENT THERAPY:Herceptin every 3 weeks/Anastrozole  INTERVAL HISTORY: [name] 69 y.o. female returns for evaluation prior to receiving her final Herceptin treatment.  She is doing quite well today.  She has no new issues.  Her most recent echocardiogram was in December and was normal.  She has f/u with Dr. Haroldine Laws next month.  Stephenia has occasional pain at her mastectomy site, but otherwise is feeling quite well.  She continues on Anastrozole daily with good tolerance.   Patient Active Problem List   Diagnosis Date Noted  . Insulin dependent type 2 diabetes mellitus ([place]) 12/09/2018  . COPD (chronic obstructive pulmonary disease) ([place]) 12/09/2018  . Chronic cholecystitis 11/11/2018  . Hemobilia   . Dilated bile duct   . Jaundice   . Elevated LFTs   . Choledocholithiasis 10/02/2018  . CKD (chronic  kidney disease), stage III   . Acute cholecystitis   . Elevated liver enzymes 09/18/2018  . Calculus of gallbladder and bile duct with acute cholecystitis, with obstruction   . Melena   . Unstable angina ([place]) 07/12/2018  . Port-A-Cath in place 02/27/2018  . Cancer of overlapping sites of right breast ([place]) 11/27/2017  .  Malignant neoplasm of upper-outer quadrant of right breast in female, estrogen receptor positive ([place]) 10/03/2017  . Carpal tunnel syndrome, bilateral 11/26/2016  . 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 Abdominal pain, epigastric   . Acute on chronic renal failure ([place]) 02/19/2015  . Diarrhea   . Nausea with vomiting   . Hyperglycemia 04/01/2014  . Hyperosmolar non-ketotic state in patient with type 2 diabetes mellitus ([place]) 04/01/2014  . Nausea vomiting and diarrhea 04/01/2014  . CAD (coronary artery disease) 04/01/2014  . Dehydration   . Diabetic ketoacidosis without coma associated with diabetes mellitus due to underlying condition ([place])   . High anion gap metabolic acidosis   . Cocaine abuse ([place])   . Toxic encephalopathy-Unlikely metabolic,?psychogenic 08/02/2012  . Bereavement 02/02/2012  . Depression 02/02/2012  . Iron deficiency anemia, unspecified 11/30/2011  . Nonspecific abnormal finding in stool contents 11/30/2011  . Benign neoplasm of colon 11/30/2011  . Cough 05/14/2011  . Chest pain 03/19/2011  . Macrocytic anemia 03/19/2011  . Insulin dependent diabetes mellitus 03/19/2011  . DOE (dyspnea on exertion) 03/19/2011  . Chest pain at rest 02/18/2011    Class: Acute  . Cholelithiasis 11/01/2010  . DM 04/08/2009  . HYPERLIPIDEMIA 04/08/2009  . HTN (hypertension) 04/08/2009  . MYOCARDIAL INFARCTION 04/08/2009  . Coronary atherosclerosis 04/08/2009  . GERD 04/08/2009  . FATTY LIVER DISEASE 04/08/2009  . COLONIC POLYPS, HX OF 04/08/2009    has No Known Allergies.  MEDICAL HISTORY: Past Medical History:  Diagnosis Date  . Ambulates with cane    straight  . Angina    no current problems per patient at PAT appt 11/05/18  . Arthritis    HANDS"  . Asthma   . Cancer ([place]) 09/04/2017   Right Breast  . Carpal tunnel syndrome, bilateral 11/26/2016  . Cholelithiasis   . Cocaine abuse ([place]) 07/2012   per E.R. drug screen  . COPD (chronic obstructive  pulmonary disease) ([place])    per patient  . Coronary artery disease   . Diabetes mellitus without mention of complication    type 2  . Dysphagia    esophageal dysmotility on 03/2011 esophagram   . Dyspnea    with exertion  . Fatty liver disease, nonalcoholic 7619   on Imaging.   Marland Kitchen GERD (gastroesophageal reflux disease)   . Headache   . Hearing loss    bilateral - no hearing aids  . History of colonic polyps 2009   adenomatous 2009, HP in 2009, 2011, 2013.   Marland Kitchen Hyperlipidemia   . Hypertension   . Iron deficiency anemia   . Myocardial infarction ([place])    years ago, 6 stents placed  . 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 Wears dentures    full upper and partial lower  . Wears glasses     SURGICAL HISTORY: Past Surgical History:  Procedure Laterality Date  . ABDOMINAL ANGIOGRAM  02/20/2011   Procedure: ABDOMINAL ANGIOGRAM;  Surgeon: Clent Demark, MD;  Location: [facility] CATH LAB;  Service: Cardiovascular;;  . CARPAL TUNNEL RELEASE Right 07/04/2017   Procedure: RIGHT CARPAL TUNNEL RELEASE;  Surgeon: Leanora Cover, MD;  Location:  [facility];  Service: Orthopedics;  Laterality: Right;  . CARPAL TUNNEL RELEASE Left 09/12/2017   Procedure: LEFT CARPAL TUNNEL RELEASE;  Surgeon: Leanora Cover, MD;  Location: [facility];  Service: Orthopedics;  Laterality: Left;  . CHOLECYSTECTOMY N/A 11/11/2018   Procedure: ATTEMPTED LAPAROSCOPIC CHOLECUSTECOMY,  OPEN CHOLECYSTECTOMY;  Surgeon: Coralie Keens, MD;  Location: [facility];  Service: General;  Laterality: N/A;  . COLONOSCOPY  11/30/2011   Procedure: COLONOSCOPY;  Surgeon: Lafayette Dragon, MD;  Location: [facility] ENDOSCOPY;  Service: Endoscopy;  Laterality: N/A;  . CORONARY ANGIOPLASTY WITH STENT PLACEMENT  02/20/2011  . ERCP N/A 10/03/2018   Procedure: ENDOSCOPIC RETROGRADE CHOLANGIOPANCREATOGRAPHY (ERCP);  Surgeon: Ladene Artist, MD;  Location: [facility];  Service: Endoscopy;  Laterality: N/A;  .  ESOPHAGOGASTRODUODENOSCOPY  11/30/2011   Procedure: ESOPHAGOGASTRODUODENOSCOPY (EGD);  Surgeon: Lafayette Dragon, MD;  Location: [facility]OSCOPY;  Service: Endoscopy;  Laterality: N/A;  . ESOPHAGOGASTRODUODENOSCOPY N/A 02/23/2015   Procedure: ESOPHAGOGASTRODUODENOSCOPY (EGD);  Surgeon: Gatha Mayer, MD;  Location: [facility] ENDOSCOPY;  Service: Endoscopy;  Laterality: N/A;  . EYE SURGERY Bilateral    cataracts removed  . INTRAOPERATIVE CHOLANGIOGRAM N/A 11/11/2018   Procedure: Intraoperative Cholangiogram;  Surgeon: Coralie Keens, MD;  Location: [facility];  Service: General;  Laterality: N/A;  . LEFT HEART CATHETERIZATION WITH CORONARY ANGIOGRAM N/A 02/20/2011   Procedure: LEFT HEART CATHETERIZATION WITH CORONARY ANGIOGRAM;  Surgeon: Clent Demark, MD;  Location: [facility] CATH LAB;  Service: Cardiovascular;  Laterality: N/A;  . MASTECTOMY Right 11/05/2017  . MASTECTOMY W/ SENTINEL NODE BIOPSY Right 11/27/2017  . MASTECTOMY W/ SENTINEL NODE BIOPSY Right 11/27/2017   Procedure: RIGHT TOTAL MASTECTOMY WITH SENTINEL LYMPH NODE BIOPSY;  Surgeon: Excell Seltzer, MD;  Location: [facility];  Service: General;  Laterality: Right;  . PORT A CATH REVISION Left 05/07/2018   Procedure: PORT A CATH REVISION;  Surgeon: Excell Seltzer, MD;  Location: [facility];  Service: General;  Laterality: Left;  . PORTACATH PLACEMENT Left 11/27/2017   Procedure: INSERTION PORT-A-CATH;  Surgeon: Excell Seltzer, MD;  Location: [facility];  Service: General;  Laterality: Left;  . RIGHT COLECTOMY  02/2007   for post polypectomy colonic perforation  . SPHINCTEROTOMY  10/03/2018   Procedure: SPHINCTEROTOMY;  Surgeon: Ladene Artist, MD;  Location: [facility];  Service: Endoscopy;;  . TUBAL LIGATION      SOCIAL HISTORY: Social History   Socioeconomic History  . Marital status: Divorced    Spouse name: Not on file  . Number of children: 2  . Years of education: Not on file  . Highest education level: Not on file  Occupational History  .  Occupation: retired. prev worked in housekeeping w/ [employer]  Tobacco Use  . Smoking status: Former Smoker    Packs/day: 0.50    Years: 47.00    Pack years: 23.50    Types: Cigarettes    Quit date: 05/09/2010    Years since quitting: 8.8  . Smokeless tobacco: Never Used  Substance and Sexual Activity  . Alcohol use: No  . Drug use: Not Currently    Types: Cocaine    Comment: positive drug screen (cocaine, benzo's)07/2012 in emergency room  . Sexual activity: Not Currently    Birth control/protection: Post-menopausal  Other Topics Concern  . Not on file  Social History Narrative   Caffeine use daily.   Social Determinants of Health   Financial Resource Strain:   . Difficulty of Paying Living Expenses: Not on file  Food Insecurity:   .  Worried About Charity fundraiser in the Last Year: Not on file  . Ran Out of Food in the Last Year: Not on file  Transportation Needs:   . Lack of Transportation (Medical): Not on file  . Lack of Transportation (Non-Medical): Not on file  Physical Activity:   . Days of Exercise per Week: Not on file  . Minutes of Exercise per Session: Not on file  Stress:   . Feeling of Stress : Not on file  Social Connections:   . Frequency of Communication with Friends and Family: Not on file  . Frequency of Social Gatherings with Friends and Family: Not on file  . Attends Religious Services: Not on file  . Active Member of Clubs or Organizations: Not on file  . Attends Archivist Meetings: Not on file  . Marital Status: Not on file  Intimate Partner Violence:   . Fear of Current or Ex-Partner: Not on file  . Emotionally Abused: Not on file  . Physically Abused: Not on file  . Sexually Abused: Not on file    FAMILY HISTORY: Family History  Problem Relation Age of Onset  . Heart disease Mother   . Diabetes Mother   . Cancer Father        unsure what kind  . Diabetes Sister   . Colon cancer Neg Hx   . Breast cancer Neg Hx     Review of  Systems  Constitutional: Negative for appetite change, chills, fatigue, fever and unexpected weight change.  HENT:   Negative for hearing loss, lump/mass and trouble swallowing.   Eyes: Negative for eye problems and icterus.  Respiratory: Negative for chest tightness, cough and shortness of breath.   Cardiovascular: Negative for chest pain, leg swelling and palpitations.  Gastrointestinal: Negative for abdominal distention, abdominal pain, constipation, diarrhea, nausea and vomiting.  Endocrine: Negative for hot flashes.  Genitourinary: Negative for difficulty urinating.   Musculoskeletal: Negative for arthralgias.  Skin: Negative for itching and rash.  Neurological: Negative for dizziness, extremity weakness, headaches and numbness.  Hematological: Negative for adenopathy. Does not bruise/bleed easily.  Psychiatric/Behavioral: Negative for depression. The patient is not nervous/anxious.       PHYSICAL EXAMINATION  ECOG PERFORMANCE STATUS: 1 - Symptomatic but completely ambulatory  Vitals:   03/12/19 1326  BP: (!) 154/95  Pulse: 70  Resp: 20  Temp: 99.1 F (37.3 C)  SpO2: 100%    Physical Exam Constitutional:      General: She is not in acute distress.    Appearance: Normal appearance. She is not ill-appearing or toxic-appearing.  HENT:     Head: Normocephalic and atraumatic.  Eyes:     General: No scleral icterus. Cardiovascular:     Rate and Rhythm: Normal rate and regular rhythm.     Pulses: Normal pulses.     Heart sounds: Normal heart sounds.  Pulmonary:     Effort: Pulmonary effort is normal.     Breath sounds: Normal breath sounds.     Comments: Right breast s/p mastectomy, no sign of local recurrence Abdominal:     General: Abdomen is flat. Bowel sounds are normal. There is no distension.     Palpations: Abdomen is soft. There is no mass.     Tenderness: There is no abdominal tenderness.  Musculoskeletal:        General: No swelling.     Cervical back:  Neck supple.  Lymphadenopathy:     Cervical: No cervical adenopathy.  Skin:  General: Skin is warm and dry.     Capillary Refill: Capillary refill takes less than 2 seconds.     Findings: No rash.  Neurological:     General: No focal deficit present.     Mental Status: She is alert.  Psychiatric:        Mood and Affect: Mood normal.        Behavior: Behavior normal.     LABORATORY DATA:  CBC    Component Value Date/Time   WBC 6.8 03/12/2019 1257   WBC 9.8 12/08/2018 2045   RBC 4.31 03/12/2019 1257   HGB 12.7 03/12/2019 1257   HCT 40.3 03/12/2019 1257   PLT 268 03/12/2019 1257   MCV 93.5 03/12/2019 1257   MCH 29.5 03/12/2019 1257   MCHC 31.5 03/12/2019 1257   RDW 15.2 03/12/2019 1257   LYMPHSABS 2.0 03/12/2019 1257   MONOABS 0.6 03/12/2019 1257   EOSABS 0.1 03/12/2019 1257   BASOSABS 0.0 03/12/2019 1257    CMP     Component Value Date/Time   NA 136 01/29/2019 1039   K 4.8 01/29/2019 1039   CL 102 01/29/2019 1039   CO2 24 01/29/2019 1039   GLUCOSE 254 (H) 01/29/2019 1039   BUN 33 (H) 01/29/2019 1039   CREATININE 2.25 (H) 01/29/2019 1039   CALCIUM 9.4 01/29/2019 1039   PROT 8.5 (H) 01/29/2019 1039   ALBUMIN 3.9 01/29/2019 1039   AST 16 01/29/2019 1039   ALT 18 01/29/2019 1039   ALKPHOS 120 01/29/2019 1039   BILITOT 0.6 01/29/2019 1039   GFRNONAA 21 (L) 01/29/2019 1039   GFRAA 25 (L) 01/29/2019 1039     ASSESSMENT and PLAN:   Malignant neoplasm of upper-outer quadrant of right breast in female, estrogen receptor positive ([place]) 11/27/2017 right mastectomy: IDC grade 2, 1.6 cm, separate foci of DCIS intermediate grade, margins negative, 0/5 lymph nodes negative,ER 100%, PR 50%, Ki-67 15%, HER-2 positive ratio 2, copy #5 T1c N0 stage Ia  Treatment plan: 1. Adjuvant Herceptin every 3 weeks for 1 year (because of poor performance status Taxol is felt to be intolerable for her), started January 2020 2. Adjuvant antiestrogen therapy with Anastrozole 1 mg  daily  Patient's daughter is the primary point of contact for her and all appointment changes will need to go through her. -------------------------------------------------------------------------------------- Hospitalization 10/02/2018-10/07/2018: Acute cholecystitis Hospitalization: 11/11/2018 through 11/18/2018: Chronic cholecystitis with gallstones: Cholecystectomy Hospitalization: 12/07/2020 12/09/2018: Chest pain, VQ scan negative for PE, the predictable chest pain treated with Lidoderm patch  She is completing Herceptin today.  She will return in 3 months for SCP visit.  She will see Dr. Lindi Adie in August, 2021.    She was recommended to continue with the appropriate pandemic precautions.      All questions were answered. The patient knows to call the clinic with any problems, questions or concerns. We can certainly see the patient much sooner if necessary.  Time spent this encounter: 20 minutes  This note was electronically signed. Scot Dock, NP 03/12/2019

## 2019-03-12 NOTE — Assessment & Plan Note (Addendum)
11/27/2017 right mastectomy: IDC grade 2, 1.6 cm, separate foci of DCIS intermediate grade, margins negative, 0/5 lymph nodes negative,ER 100%, PR 50%, Ki-67 15%, HER-2 positive ratio 2, copy #5 T1c N0 stage Ia  Treatment plan: 1. Adjuvant Herceptin every 3 weeks for 1 year (because of poor performance status Taxol is felt to be intolerable for her), started January 2020 2. Adjuvant antiestrogen therapy with Anastrozole 1 mg daily  Patient's daughter is the primary point of contact for her and all appointment changes will need to go through her. -------------------------------------------------------------------------------------- Hospitalization 10/02/2018-10/07/2018: Acute cholecystitis Hospitalization: 11/11/2018 through 11/18/2018: Chronic cholecystitis with gallstones: Cholecystectomy Hospitalization: 12/07/2020 12/09/2018: Chest pain, VQ scan negative for PE, the predictable chest pain treated with Lidoderm patch  She is completing Herceptin today.  She will return in 3 months for SCP visit.  She will see Dr. Lindi Adie in August, 2021.    She was recommended to continue with the appropriate pandemic precautions.

## 2019-03-12 NOTE — Patient Instructions (Signed)
[facility] Discharge Instructions for Patients Receiving Chemotherapy  Today you received the following chemotherapy agents trastuzumab.  To help prevent nausea and vomiting after your treatment, we encourage you to take your nausea medication as directed.    If you develop nausea and vomiting that is not controlled by your nausea medication, call the clinic.   BELOW ARE SYMPTOMS THAT SHOULD BE REPORTED IMMEDIATELY:  *FEVER GREATER THAN 100.5 F  *CHILLS WITH OR WITHOUT FEVER  NAUSEA AND VOMITING THAT IS NOT CONTROLLED WITH YOUR NAUSEA MEDICATION  *UNUSUAL SHORTNESS OF BREATH  *UNUSUAL BRUISING OR BLEEDING  TENDERNESS IN MOUTH AND THROAT WITH OR WITHOUT PRESENCE OF ULCERS  *URINARY PROBLEMS  *BOWEL PROBLEMS  UNUSUAL RASH Items with * indicate a potential emergency and should be followed up as soon as possible.  Feel free to call the clinic should you have any questions or concerns. The clinic phone number is [phone].  Please show the CHEMO ALERT CARD at check-in to the Emergency Department and triage nurse.   

## 2019-03-12 NOTE — Patient Instructions (Signed)

## 2019-03-13 ENCOUNTER — Telehealth: Payer: Self-pay | Admitting: Adult Health

## 2019-03-13 NOTE — Telephone Encounter (Signed)
I left a message regarding schedule for [facility] and 8/9

## 2019-03-16 ENCOUNTER — Encounter: Payer: Self-pay | Admitting: *Deleted

## 2019-03-18 DIAGNOSIS — E119 Type 2 diabetes mellitus without complications: Secondary | ICD-10-CM | POA: Diagnosis not present

## 2019-03-18 DIAGNOSIS — H04123 Dry eye syndrome of bilateral lacrimal glands: Secondary | ICD-10-CM | POA: Diagnosis not present

## 2019-03-18 DIAGNOSIS — H35371 Puckering of macula, right eye: Secondary | ICD-10-CM | POA: Diagnosis not present

## 2019-03-18 DIAGNOSIS — H10413 Chronic giant papillary conjunctivitis, bilateral: Secondary | ICD-10-CM | POA: Diagnosis not present

## 2019-03-18 DIAGNOSIS — Z961 Presence of intraocular lens: Secondary | ICD-10-CM | POA: Diagnosis not present

## 2019-04-08 ENCOUNTER — Telehealth (HOSPITAL_COMMUNITY): Payer: Self-pay | Admitting: Cardiology

## 2019-04-08 NOTE — Telephone Encounter (Signed)
Patient left detailed message with a request to cancel upcoming appts. Echo and follow up cancelled with detailed message to return call to reschedule

## 2019-04-10 ENCOUNTER — Ambulatory Visit (HOSPITAL_COMMUNITY): Payer: Medicare Other

## 2019-04-10 ENCOUNTER — Encounter (HOSPITAL_COMMUNITY): Payer: Medicare Other | Admitting: Internal Medicine

## 2019-04-24 ENCOUNTER — Ambulatory Visit: Payer: Medicare Other | Admitting: Family Medicine

## 2019-05-18 ENCOUNTER — Ambulatory Visit: Payer: Medicare Other | Admitting: Family Medicine

## 2019-05-29 ENCOUNTER — Other Ambulatory Visit: Payer: Self-pay

## 2019-05-29 ENCOUNTER — Ambulatory Visit (HOSPITAL_COMMUNITY)
Admission: RE | Admit: 2019-05-29 | Discharge: 2019-05-29 | Disposition: A | Discharge status: Home / Self Care | Payer: Medicare Other | Source: Ambulatory Visit | Attending: Internal Medicine | Admitting: Internal Medicine

## 2019-05-29 ENCOUNTER — Encounter (HOSPITAL_COMMUNITY): Payer: Self-pay | Admitting: Internal Medicine

## 2019-05-29 ENCOUNTER — Ambulatory Visit (HOSPITAL_BASED_OUTPATIENT_CLINIC_OR_DEPARTMENT_OTHER)
Admission: RE | Admit: 2019-05-29 | Discharge: 2019-05-29 | Disposition: A | Discharge status: Home / Self Care | Payer: Medicare Other | Source: Ambulatory Visit | Attending: Internal Medicine | Admitting: Internal Medicine

## 2019-05-29 VITALS — BP 96/74 | HR 71 | Wt 138.8 lb

## 2019-05-29 DIAGNOSIS — Z833 Family history of diabetes mellitus: Secondary | ICD-10-CM | POA: Diagnosis not present

## 2019-05-29 DIAGNOSIS — I2510 Atherosclerotic heart disease of native coronary artery without angina pectoris: Secondary | ICD-10-CM | POA: Diagnosis not present

## 2019-05-29 DIAGNOSIS — I493 Ventricular premature depolarization: Secondary | ICD-10-CM

## 2019-05-29 DIAGNOSIS — E119 Type 2 diabetes mellitus without complications: Secondary | ICD-10-CM | POA: Insufficient documentation

## 2019-05-29 DIAGNOSIS — Z955 Presence of coronary angioplasty implant and graft: Secondary | ICD-10-CM | POA: Insufficient documentation

## 2019-05-29 DIAGNOSIS — Z87891 Personal history of nicotine dependence: Secondary | ICD-10-CM | POA: Diagnosis not present

## 2019-05-29 DIAGNOSIS — E785 Hyperlipidemia, unspecified: Secondary | ICD-10-CM | POA: Diagnosis not present

## 2019-05-29 DIAGNOSIS — I351 Nonrheumatic aortic (valve) insufficiency: Secondary | ICD-10-CM | POA: Insufficient documentation

## 2019-05-29 DIAGNOSIS — Z170 Estrogen receptor positive status [ER+]: Secondary | ICD-10-CM | POA: Insufficient documentation

## 2019-05-29 DIAGNOSIS — C50411 Malignant neoplasm of upper-outer quadrant of right female breast: Secondary | ICD-10-CM | POA: Diagnosis not present

## 2019-05-29 DIAGNOSIS — Z9011 Acquired absence of right breast and nipple: Secondary | ICD-10-CM | POA: Insufficient documentation

## 2019-05-29 DIAGNOSIS — Z01818 Encounter for other preprocedural examination: Secondary | ICD-10-CM | POA: Insufficient documentation

## 2019-05-29 DIAGNOSIS — I10 Essential (primary) hypertension: Secondary | ICD-10-CM | POA: Insufficient documentation

## 2019-05-29 DIAGNOSIS — Z7951 Long term (current) use of inhaled steroids: Secondary | ICD-10-CM | POA: Insufficient documentation

## 2019-05-29 DIAGNOSIS — Z8719 Personal history of other diseases of the digestive system: Secondary | ICD-10-CM | POA: Insufficient documentation

## 2019-05-29 DIAGNOSIS — I252 Old myocardial infarction: Secondary | ICD-10-CM | POA: Diagnosis not present

## 2019-05-29 DIAGNOSIS — H9193 Unspecified hearing loss, bilateral: Secondary | ICD-10-CM | POA: Diagnosis not present

## 2019-05-29 DIAGNOSIS — Z7982 Long term (current) use of aspirin: Secondary | ICD-10-CM | POA: Diagnosis not present

## 2019-05-29 DIAGNOSIS — M1990 Unspecified osteoarthritis, unspecified site: Secondary | ICD-10-CM | POA: Insufficient documentation

## 2019-05-29 DIAGNOSIS — Z79899 Other long term (current) drug therapy: Secondary | ICD-10-CM | POA: Diagnosis not present

## 2019-05-29 DIAGNOSIS — Z8249 Family history of ischemic heart disease and other diseases of the circulatory system: Secondary | ICD-10-CM | POA: Insufficient documentation

## 2019-05-29 DIAGNOSIS — J449 Chronic obstructive pulmonary disease, unspecified: Secondary | ICD-10-CM | POA: Diagnosis not present

## 2019-05-29 DIAGNOSIS — Z79811 Long term (current) use of aromatase inhibitors: Secondary | ICD-10-CM | POA: Diagnosis not present

## 2019-05-29 DIAGNOSIS — Z794 Long term (current) use of insulin: Secondary | ICD-10-CM | POA: Diagnosis not present

## 2019-05-29 DIAGNOSIS — K219 Gastro-esophageal reflux disease without esophagitis: Secondary | ICD-10-CM | POA: Insufficient documentation

## 2019-05-29 NOTE — Patient Instructions (Signed)
It was great to see you today! No medication changes are needed at this time.  You can follow up with [place] as needed.  Do the following things EVERYDAY: 1) Weigh yourself in the morning before breakfast. Write it down and keep it in a log. 2) Take your medicines as prescribed 3) Eat low salt foods--Limit salt (sodium) to 2000 mg per day.  4) Stay as active as you can everyday 5) Limit all fluids for the day to less than 2 liters    At the [facility], you and your health needs are our priority. As part of our continuing mission to provide you with exceptional heart care, we have created designated Provider Care Teams. These Care Teams include your primary Cardiologist (physician) and Advanced Practice Providers (APPs- Physician Assistants and Nurse Practitioners) who all work together to provide you with the care you need, when you need it.   You may see any of the following providers on your designated Care Team at your next follow up: Marland Kitchen Dr Glori Bickers . Dr Loralie Champagne . Darrick Grinder, NP . Lyda Jester, PA . Audry Riles, PharmD   Please be sure to bring in all your medications bottles to every appointment.

## 2019-05-29 NOTE — Progress Notes (Signed)
  Echocardiogram 2D Echocardiogram has been performed.  Syrita Dovel A Girtie Wiersma 05/29/2019, 9:42 AM

## 2019-05-29 NOTE — Progress Notes (Signed)
Cardio-Oncology Clinic Note   Date:  05/29/2019   ID:  Dominique, [name] 1950-08-30, MRN [id number]  Location: Home  Provider location: [facility] Type of Visit: Established patient  PCP:  Tsosie Billing, MD (Inactive)  Cardiologist:  No primary care provider on file. Primary HF: Raudel Bazen  Chief Complaint: Cardio-oncology follow-up   History of Present Illness:  [name] is a 69 y.o. female with past medical history of R breast cancer, CAD s/p RCA and OM stent followed by Dr. Terrence Dupont who has been referred by Dr. Lindi Adie to establish in the cardio-oncology clinic for monitoring of cardio-toxicity while undergoing chemotherapy for her R breast CA..      Malignant neoplasm of upper-outer quadrant of right breast in female, estrogen receptor positive ([place])   09/03/2017 Initial Diagnosis    Screening mammogram detected 1.4 cm right breast mass at 10 o'clock position, additional pleomorphic calcifications 2 cm posteriorly, additional loosely grouped calcifications 10.1 x 7.1 x 5.3 cm, single right axillary lymph node: Biopsy revealed DCIS ER 100%, PR 50%; IDC grade 2-3 ER 100%, PR 50%, Ki-67 15%, HER-2 positive ratio 2, copy #5, lymph node negative T2 N0 stage Ia clinical stage AJCC 8    11/27/2017 Surgery    Right mastectomy: IDC grade 2, 1.6 cm, separate foci of DCIS intermediate grade, margins negative, 0/5 lymph nodes negative,ER 100%, PR 50%, Ki-67 15%, HER-2 positive ratio 2, copy #5 T1c N0 stage Ia    She presents for routine f/u for cardio-oncology surveillance. She finished Herceptin on 03/12/19. No CP. Says breathing OK No edema, orthopnea or PND.    Echos:   Echo today 05/28/19 EF 45-50% mild AI. Grade I DD GLS -13.9  Echo 12/20 EF 50-55% mild AI Grade I DD GLS -17.1    Echo  09/30/18  EF 55-60% moderate AI Personally reviewed  Echo 07/10/18 EF 50-55% GLS inaccurate  Echo 1/20  LVEF 55-60%, mild AI, Grade 1 DD,  -16.4  Echo 11/26/17 LVEF 50-55%, Grade 1 DD, Mild/mod AI, GLS - 14.55  Stress test 04/20/2016 (Most recent) 1. No reversible ischemia or infarction. 2. Normal left ventricular wall motion. 3. Left ventricular ejection fraction 54% 4. Non invasive risk stratification*: Low  LHC 02/20/2011 - LV showed mild global hypokinesia, EF of approximately 50%. - Left main was patent. LAD has 10-15% proximal stenosis.  -Diagonal 1 has 20% ostial stenosis.Diagonal 2 is very small.  - Left circumflex has 40-50% proximal sequential stenosis. Stented midportion is patent.  - OM 1 and OM 2 were very, very small. OM 3 and 4 were small which were patent.  - RCA was patent at prior PTCA stenting site. Aortography showed no aortic aneurysm.  - Right renal artery has 60-70% ostial stenosis. Left renal artery is patent.     [name] denies symptoms worrisome for COVID 19.   Past Medical History:  Diagnosis Date  . Ambulates with cane    straight  . Angina    no current problems per patient at PAT appt 11/05/18  . Arthritis    HANDS"  . Asthma   . Cancer ([place]) 09/04/2017   Right Breast  . Carpal tunnel syndrome, bilateral 11/26/2016  . Cholelithiasis   . Cocaine abuse ([place]) 07/2012   per E.R. drug screen  . COPD (chronic obstructive pulmonary disease) ([place])    per patient  . Coronary artery disease   . Diabetes mellitus without mention of complication  type 2  . Dysphagia    esophageal dysmotility on 03/2011 esophagram   . Dyspnea    with exertion  . Fatty liver disease, nonalcoholic 2706   on Imaging.   Marland Kitchen GERD (gastroesophageal reflux disease)   . Headache   . Hearing loss    bilateral - no hearing aids  . History of colonic polyps 2009   adenomatous 2009, HP in 2009, 2011, 2013.   Marland Kitchen Hyperlipidemia   . Hypertension   . Iron deficiency anemia   . Myocardial infarction ([place])    years ago, 6 stents placed  . 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 Wears  dentures    full upper and partial lower  . Wears glasses    Past Surgical History:  Procedure Laterality Date  . ABDOMINAL ANGIOGRAM  02/20/2011   Procedure: ABDOMINAL ANGIOGRAM;  Surgeon: Clent Demark, MD;  Location: [facility] CATH LAB;  Service: Cardiovascular;;  . CARPAL TUNNEL RELEASE Right 07/04/2017   Procedure: RIGHT CARPAL TUNNEL RELEASE;  Surgeon: Leanora Cover, MD;  Location: [place];  Service: Orthopedics;  Laterality: Right;  . CARPAL TUNNEL RELEASE Left 09/12/2017   Procedure: LEFT CARPAL TUNNEL RELEASE;  Surgeon: Leanora Cover, MD;  Location: [place];  Service: Orthopedics;  Laterality: Left;  . CHOLECYSTECTOMY N/A 11/11/2018   Procedure: ATTEMPTED LAPAROSCOPIC CHOLECUSTECOMY,  OPEN CHOLECYSTECTOMY;  Surgeon: Coralie Keens, MD;  Location: [place];  Service: General;  Laterality: N/A;  . COLONOSCOPY  11/30/2011   Procedure: COLONOSCOPY;  Surgeon: Lafayette Dragon, MD;  [facility]: [facility];  Service: Endoscopy;  Laterality: N/A;  . CORONARY ANGIOPLASTY WITH STENT PLACEMENT  02/20/2011  . ERCP N/A 10/03/2018   Procedure: ENDOSCOPIC RETROGRADE CHOLANGIOPANCREATOGRAPHY (ERCP);  Surgeon: Ladene Artist, MD;  Location: [facility];  Service: Endoscopy;  Laterality: N/A;  . ESOPHAGOGASTRODUODENOSCOPY  11/30/2011   Procedure: ESOPHAGOGASTRODUODENOSCOPY (EGD);  Surgeon: Lafayette Dragon, MD;  Location: [facility];  Service: Endoscopy;  Laterality: N/A;  . ESOPHAGOGASTRODUODENOSCOPY N/A 02/23/2015   Procedure: ESOPHAGOGASTRODUODENOSCOPY (EGD);  Surgeon: Gatha Mayer, MD;  Location[facility]OSCOPY;  Service: Endoscopy;  Laterality: N/A;  . EYE SURGERY Bilateral    cataracts removed  . INTRAOPERATIVE CHOLANGIOGRAM N/A 11/11/2018   Procedure: Intraoperative Cholangiogram;  Surgeon: Coralie Keens, MD;  Location: [place];  Service: General;  Laterality: N/A;  . LEFT HEART CATHETERIZATION WITH CORONARY ANGIOGRAM N/A 02/20/2011   Procedure: LEFT HEART  CATHETERIZATION WITH CORONARY ANGIOGRAM;  Surgeon: Clent Demark, MD;  Location: [place] CATH LAB;  Service: Cardiovascular;  Laterality: N/A;  . MASTECTOMY Right 11/05/2017  . MASTECTOMY W/ SENTINEL NODE BIOPSY Right 11/27/2017  . MASTECTOMY W/ SENTINEL NODE BIOPSY Right 11/27/2017   Procedure: RIGHT TOTAL MASTECTOMY WITH SENTINEL LYMPH NODE BIOPSY;  Surgeon: Excell Seltzer, MD;  Location: [place];  Service: General;  Laterality: Right;  . PORT A CATH REVISION Left 05/07/2018   Procedure: PORT A CATH REVISION;  Surgeon: Excell Seltzer, MD;  [facility];  Service: General;  Laterality: Left;  . PORTACATH PLACEMENT Left 11/27/2017   Procedure: INSERTION PORT-A-CATH;  Surgeon: Excell Seltzer, MD;  Location: [place];  Service: General;  Laterality: Left;  . RIGHT COLECTOMY  02/2007   for post polypectomy colonic perforation  . SPHINCTEROTOMY  10/03/2018   Procedure: SPHINCTEROTOMY;  Surgeon: Ladene Artist, MD;  Location: [facility];  Service: Endoscopy;;  . TUBAL LIGATION       Current Outpatient Medications  Medication Sig Dispense Refill  . albuterol (  PROVENTIL) (2.5 MG/3ML) 0.083% nebulizer solution Take 2.5 mg by nebulization every 6 (six) hours as needed for wheezing or shortness of breath.    . allopurinol (ZYLOPRIM) 300 MG tablet Take 300 mg by mouth every evening.     Marland Kitchen ALPRAZolam (XANAX) 0.25 MG tablet Take 0.25 mg by mouth at bedtime.     Marland Kitchen amLODipine (NORVASC) 5 MG tablet Take 5 mg by mouth daily.    Marland Kitchen anastrozole (ARIMIDEX) 1 MG tablet Take 1 tablet (1 mg total) by mouth daily. 90 tablet 3  . aspirin 81 MG EC tablet Take 1 tablet (81 mg total) by mouth daily. 30 tablet 0  . atorvastatin (LIPITOR) 20 MG tablet Take 20 mg by mouth daily at 6 PM.     . budesonide-formoterol (SYMBICORT) 160-4.5 MCG/ACT inhaler Inhale 1 puff into the lungs 2 (two) times daily as needed (respiratory issues.).     Marland Kitchen LANTUS SOLOSTAR 100 UNIT/ML Solostar Pen Inject 10 Units into the skin at  bedtime.     . lidocaine (LIDODERM) 5 % 1 patch daily.    Marland Kitchen loperamide (IMODIUM) 2 MG capsule Take 1 capsule (2 mg total) by mouth 4 (four) times daily as needed for diarrhea or loose stools. 12 capsule 0  . magnesium oxide (MAG-OX) 400 (241.3 Mg) MG tablet Take 1 tablet (400 mg total) by mouth 2 (two) times daily. 60 tablet 0  . metoprolol succinate (TOPROL-XL) 50 MG 24 hr tablet Take 50 mg by mouth daily. Take with or immediately following a meal.    . Multiple Vitamin (MULTIVITAMIN WITH MINERALS) TABS tablet Take 1 tablet by mouth daily. [place]    . nitroGLYCERIN (NITROSTAT) 0.4 MG SL tablet Place 0.4 mg under the tongue every 5 (five) minutes as needed for chest pain.    . Omega-3 Fatty Acids (FISH OIL) 1000 MG CAPS Take 1,000 mg by mouth daily.    . ondansetron (ZOFRAN) 8 MG tablet Take 1 tablet (8 mg total) by mouth every 8 (eight) hours as needed for nausea or vomiting. 20 tablet 0  . pantoprazole (PROTONIX) 40 MG tablet Take 40 mg by mouth daily.    . traMADol (ULTRAM) 50 MG tablet Take 1 tablet (50 mg total) by mouth every 6 (six) hours as needed for moderate pain. 30 tablet 0   No current facility-administered medications for this encounter.    Allergies:   Patient has no known allergies.   Social History:  The patient  reports that she quit smoking about 9 years ago. Her smoking use included cigarettes. She has a 23.50 pack-year smoking history. She has never used smokeless tobacco. She reports previous drug use. Drug: Cocaine. She reports that she does not drink alcohol.   Family History:  The patient's family history includes Cancer in her father; Diabetes in her mother and sister; Heart disease in her mother.   ROS:  Please see the history of present illness.   All other systems are personally reviewed and negative.   Exam:   General:  Elderly No resp difficulty. Smells like cigarette smoke  HEENT: normal Neck: supple. no JVD. Carotids 2+ bilat; no bruits. No lymphadenopathy  or thryomegaly appreciated. Cor: PMI nondisplaced. Regular rate & rhythm. No rubs, gallops or murmurs. Lungs: markedly reduced breath sounds throughtout Abdomen: obese soft, nontender, nondistended. No hepatosplenomegaly. No bruits or masses. Good bowel sounds. Extremities: no cyanosis, clubbing, rash, edema Neuro: alert & orientedx3, cranial nerves grossly intact. moves all 4 extremities w/o difficulty. Affect pleasant  Recent Labs: 07/12/2018: B Natriuretic Peptide 25.3 10/06/2018: Magnesium 1.6 01/29/2019: ALT 18; BUN 33; Creatinine 2.25; Potassium 4.8; Sodium 136 03/12/2019: Hemoglobin 12.7; Platelet Count 268  Personally reviewed   Wt Readings from Last 3 Encounters:  05/29/19 63 kg (138 lb 12.8 oz)  03/12/19 60.1 kg (132 lb 9.6 oz)  01/29/19 60 kg (132 lb 4.8 oz)    ECG: NSR 63 occasional PVCs prwp Personally reviewed  ASSESSMENT AND PLAN:  1. Malignant neoplasm of upper-outer quadrant of right breast in female, estrogen receptor positive ([name]) - She has finished herceptin. Did not tolerate Taxol due to malaise and paraesthesias per patient report.  - Echo today EF 45-50% mild AI GLS -13.9 % - Echo 07/10/18 EF 50-55% GLS inaccurate - Echo 1/20  LVEF 55-60%, mild AI, Grade 1 DD, -16.4 - Echo 11/26/17 LVEF 50-55%, Grade 1 DD, Mild/mod AI, GLS - 14.55 - Echo today EF 50-55% Mild AI  - EF today slightly lower than previous but doubt related to Herceptin as it has been completed in February. ? If related to frequent PVCs seen on echo  - Now that Herceptin complete can f/u PRN.   2. CAD  - No recent CP   - LHC 02/2011 with patent stents - 04/2016 Myoview with no reversible ischemia or infarction.  - Continue ASA. Statin, b-blocker - follows with Dr. Terrence Dupont   3. Mild to Moderate AI - stable. AI mild to moderate   Followed by Dr.  Terrence Dupont  4. COPD   - denies ongoing tobacco use but smells heavily like tobacco smoke today  5. PVCs. - Frequent PVCs on echo. Only rare PVCs on  ECG - Consider Zio Monitor with Dr. Terrence Dupont to quantify  Signed, Glori Bickers, MD  05/29/2019 10:13 AM  [facility] [place] [address] [phone] (office) [phone] (fax)

## 2019-06-02 DIAGNOSIS — I25118 Atherosclerotic heart disease of native coronary artery with other forms of angina pectoris: Secondary | ICD-10-CM | POA: Diagnosis not present

## 2019-06-02 DIAGNOSIS — E119 Type 2 diabetes mellitus without complications: Secondary | ICD-10-CM | POA: Diagnosis not present

## 2019-06-02 DIAGNOSIS — N189 Chronic kidney disease, unspecified: Secondary | ICD-10-CM | POA: Diagnosis not present

## 2019-06-02 DIAGNOSIS — E785 Hyperlipidemia, unspecified: Secondary | ICD-10-CM | POA: Diagnosis not present

## 2019-06-02 DIAGNOSIS — I10 Essential (primary) hypertension: Secondary | ICD-10-CM | POA: Diagnosis not present

## 2019-06-03 ENCOUNTER — Other Ambulatory Visit: Payer: Self-pay | Admitting: Hematology and Oncology

## 2019-06-05 ENCOUNTER — Ambulatory Visit: Payer: Medicare Other | Admitting: Nurse Practitioner

## 2019-06-10 ENCOUNTER — Ambulatory Visit: Payer: Medicare Other | Attending: Family Medicine | Admitting: Nurse Practitioner

## 2019-06-10 ENCOUNTER — Encounter: Payer: Self-pay | Admitting: Nurse Practitioner

## 2019-06-10 ENCOUNTER — Telehealth: Payer: Self-pay | Admitting: Adult Health

## 2019-06-10 ENCOUNTER — Other Ambulatory Visit: Payer: Self-pay

## 2019-06-10 DIAGNOSIS — I10 Essential (primary) hypertension: Secondary | ICD-10-CM

## 2019-06-10 DIAGNOSIS — Z7689 Persons encountering health services in other specified circumstances: Secondary | ICD-10-CM

## 2019-06-10 DIAGNOSIS — E119 Type 2 diabetes mellitus without complications: Secondary | ICD-10-CM | POA: Diagnosis not present

## 2019-06-10 DIAGNOSIS — Z794 Long term (current) use of insulin: Secondary | ICD-10-CM

## 2019-06-10 MED ORDER — LANTUS SOLOSTAR 100 UNIT/ML ~~LOC~~ SOPN: 20.0000 [IU] | PEN_INJECTOR | Freq: Every day | SUBCUTANEOUS | 6 refills | Status: DC

## 2019-06-10 MED ORDER — BD PEN NEEDLE MINI U/F 31G X 5 MM MISC: 6 refills | Status: DC

## 2019-06-10 NOTE — Progress Notes (Signed)
Virtual Visit via Telephone Note Due to national recommendations of social distancing due to [place] 19, telehealth visit is felt to be most appropriate for this patient at this time.  I discussed the limitations, risks, security and privacy concerns of performing an evaluation and management service by telephone and the availability of in person appointments. I also discussed with the patient that there may be a patient responsible charge related to this service. The patient expressed understanding and agreed to proceed.    I connected with [name] on 06/10/19  at   3:50 PM EDT  EDT by telephone and verified that I am speaking with the correct person using two identifiers.   Consent I discussed the limitations, risks, security and privacy concerns of performing an evaluation and management service by telephone and the availability of in person appointments. I also discussed with the patient that there may be a patient responsible charge related to this service. The patient expressed understanding and agreed to proceed.   Location of Patient: Private Residence    Location of Provider: [place] [facility] [employer] Office    Persons participating in Telemedicine visit: [name] FNP-BC [place] [facility] [place]    History of Present Illness: Telemedicine visit for: Establish Care   She has a long list of chronic medical conditions which incude:  Asthma, Right Breast Cancer (09/04/2017 Followed by Oncology), Carpal tunnel syndrome, bilateral (11/26/2016), Cholelithiasis, Cocaine abuse (07/2012), COPD, CAD s/p RCA and OM stent (Followed by Cardiology Dr. Haroldine Laws), DM TYPE 2, Fatty liver disease, nonalcoholic (7408), GERD Hyperlipidemia, HTN, MI    She was referred to Cardiology by Dr. Lindi Adie to establish in the cardio-oncology clinic for monitoring of cardio-toxicity while undergoing chemotherapy for her R breast CA.[name]                                                                                                                                                                 She has not had a primary care provider provider for as long as she can remember.  I will manage her DM and other primary care needs and she will continue to follow up with the above mentioned specialists.  She does not see a kidney specialist. Creatinine has been increasing. Will repeat CMP.   DM TYPE 2 She is currently on Lantus 20 units per day.   Monitoring her blood glucose levels 1-2 times per day. This morning's reading fasting: 225.  Overdue for eye exam. Referral placed. LDL at goal of <70. Taking STATIN and ARB.  Lab Results  Component Value Date   HGBA1C 6.0 (H) 12/08/2018   Lab Results  Component Value Date   LDLCALC 32 07/13/2018    Essential Hypertension Taking amlodipine 5 mg daily, Toprol XL  50 mg daily and losartan 25 mg daily. She also monitors her blood pressure at home. Denies chest pain, worsening shortness of breath, lightheadedness, dizziness, headaches or BLE edema.  BP Readings from Last 3 Encounters:  05/29/19 96/74  03/12/19 (!) 154/95  02/19/19 (!) 168/70      Past Medical History:  Diagnosis Date  . Ambulates with cane    straight  . Angina    no current problems per patient at PAT appt 11/05/18  . Arthritis    HANDS"  . Asthma   . Cancer ([place]) 09/04/2017   Right Breast  . Carpal tunnel syndrome, bilateral 11/26/2016  . Cholelithiasis   . Cocaine abuse ([place]) 07/2012   per E.R. drug screen  . COPD (chronic obstructive pulmonary disease) ([place])    per patient  . Coronary artery disease   . Diabetes mellitus without mention of complication    type 2  . Dysphagia    esophageal dysmotility on 03/2011 esophagram   . Dyspnea    with exertion  . Fatty liver disease, nonalcoholic 1638   on Imaging.   [name] GERD (gastroesophageal reflux disease)   . Headache   . Hearing loss    bilateral - no hearing aids  . History of colonic polyps 2009    adenomatous 2009, HP in 2009, 2011, 2013.   [name] Hyperlipidemia   . Hypertension   . Iron deficiency anemia   . Myocardial infarction ([place])    years ago, 6 stents placed  . 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 Wears dentures    full upper and partial lower  . Wears glasses     Past Surgical History:  Procedure Laterality Date  . ABDOMINAL ANGIOGRAM  02/20/2011   Procedure: ABDOMINAL ANGIOGRAM;  Surgeon: Clent Demark, MD;  Location: [facility] CATH LAB;  Service: Cardiovascular;;  . CARPAL TUNNEL RELEASE Right 07/04/2017   Procedure: RIGHT CARPAL TUNNEL RELEASE;  Surgeon: Leanora Cover, MD;  Location: [place];  Service: Orthopedics;  Laterality: Right;  . CARPAL TUNNEL RELEASE Left 09/12/2017   Procedure: LEFT CARPAL TUNNEL RELEASE;  Surgeon: Leanora Cover, MD;  Location: [place];  Service: Orthopedics;  Laterality: Left;  . CHOLECYSTECTOMY N/A 11/11/2018   Procedure: ATTEMPTED LAPAROSCOPIC CHOLECUSTECOMY,  OPEN CHOLECYSTECTOMY;  Surgeon: Coralie Keens, MD;  Location: [place];  Service: General;  Laterality: N/A;  . COLONOSCOPY  11/30/2011   Procedure: COLONOSCOPY;  Surgeon: Lafayette Dragon, MD;  [facility];  Service: Endoscopy;  Laterality: N/A;  . CORONARY ANGIOPLASTY WITH STENT PLACEMENT  02/20/2011  . ERCP N/A 10/03/2018   Procedure: ENDOSCOPIC RETROGRADE CHOLANGIOPANCREATOGRAPHY (ERCP);  Surgeon: Ladene Artist, MD;  Location: [facility];  Service: Endoscopy;  Laterality: N/A;  . ESOPHAGOGASTRODUODENOSCOPY  11/30/2011   Procedure: ESOPHAGOGASTRODUODENOSCOPY (EGD);  Surgeon: Lafayette Dragon, MD;  Location: [facility];  Service: Endoscopy;  Laterality: N/A;  . ESOPHAGOGASTRODUODENOSCOPY N/A 02/23/2015   Procedure: ESOPHAGOGASTRODUODENOSCOPY (EGD);  Surgeon: Gatha Mayer, MD;  Location: [facility] ENDOSCOPY;  Service: Endoscopy;  Laterality: N/A;  . EYE SURGERY Bilateral    cataracts removed  . INTRAOPERATIVE CHOLANGIOGRAM N/A 11/11/2018    Procedure: Intraoperative Cholangiogram;  Surgeon: Coralie Keens, MD;  Location: [place];  Service: General;  Laterality: N/A;  . LEFT HEART CATHETERIZATION WITH CORONARY ANGIOGRAM N/A 02/20/2011   Procedure: LEFT HEART CATHETERIZATION WITH CORONARY ANGIOGRAM;  Surgeon: Clent Demark, MD;  Location: [place] CATH LAB;  Service: Cardiovascular;  Laterality: N/A;  . MASTECTOMY Right 11/05/2017  .  MASTECTOMY W/ SENTINEL NODE BIOPSY Right 11/27/2017  . MASTECTOMY W/ SENTINEL NODE BIOPSY Right 11/27/2017   Procedure: RIGHT TOTAL MASTECTOMY WITH SENTINEL LYMPH NODE BIOPSY;  Surgeon: Excell Seltzer, MD;  Location: [place];  Service: General;  Laterality: Right;  . PORT A CATH REVISION Left 05/07/2018   Procedure: PORT A CATH REVISION;  Surgeon: Excell Seltzer, MD;  [facility];  Service: General;  Laterality: Left;  . PORTACATH PLACEMENT Left 11/27/2017   Procedure: INSERTION PORT-A-CATH;  Surgeon: Excell Seltzer, MD;  Location: [place];  Service: General;  Laterality: Left;  . RIGHT COLECTOMY  02/2007   for post polypectomy colonic perforation  . SPHINCTEROTOMY  10/03/2018   Procedure: SPHINCTEROTOMY;  Surgeon: Ladene Artist, MD;  Location: [facility];  Service: Endoscopy;;  . TUBAL LIGATION      Family History  Problem Relation Age of Onset  . Heart disease Mother   . Diabetes Mother   . Cancer Father        unsure what kind  . Diabetes Sister   . Colon cancer Neg Hx   . Breast cancer Neg Hx     Social History   Socioeconomic History  . Marital status: Divorced    Spouse name: Not on file  . Number of children: 2  . Years of education: Not on file  . Highest education level: Not on file  Occupational History  . Occupation: retired. prev worked in housekeeping w/ [employer]  Tobacco Use  . Smoking status: Former Smoker    Packs/day: 0.50    Years: 47.00    Pack years: 23.50    Types: Cigarettes    Quit date: 05/09/2010    Years since quitting: 9.0  . Smokeless tobacco:  Never Used  Substance and Sexual Activity  . Alcohol use: Yes  . Drug use: Not Currently    Types: Cocaine    Comment: positive drug screen (cocaine, benzo's)07/2012 in emergency room  . Sexual activity: Not Currently    Birth control/protection: Post-menopausal  Other Topics Concern  . Not on file  Social History Narrative   Caffeine use daily.   Social Determinants of Health   Financial Resource Strain:   . Difficulty of Paying Living Expenses:   Food Insecurity:   . Worried About Charity fundraiser in the Last Year:   . Arboriculturist in the Last Year:   Transportation Needs:   . Film/video editor (Medical):   [name] Lack of Transportation (Non-Medical):   Physical Activity:   . Days of Exercise per Week:   . Minutes of Exercise per Session:   Stress:   . Feeling of Stress :   Social Connections:   . Frequency of Communication with Friends and Family:   . Frequency of Social Gatherings with Friends and Family:   . Attends Religious Services:   . Active Member of Clubs or Organizations:   . Attends Archivist Meetings:   [name] Marital Status:      Observations/Objective: Awake, alert and oriented x 3   Review of Systems  Constitutional: Negative for fever, malaise/fatigue and weight loss.  HENT: Negative.  Negative for nosebleeds.   Eyes: Negative.  Negative for blurred vision, double vision and photophobia.  Respiratory: Negative.  Negative for cough and shortness of breath.   Cardiovascular: Negative.  Negative for chest pain, palpitations and leg swelling.  Gastrointestinal: Negative.  Negative for heartburn, nausea and vomiting.  Musculoskeletal: Positive for joint pain. Negative for  myalgias.  Neurological: Negative.  Negative for dizziness, focal weakness, seizures and headaches.  Psychiatric/Behavioral: Negative.  Negative for suicidal ideas.    Assessment and Plan: Em[place] was seen today for establish care.  Diagnoses and all orders for this  visit:  Encounter to establish care  Insulin dependent type 2 diabetes mellitus (HCC) -     LANTUS SOLOSTAR 100 UNIT/ML Solostar Pen; Inject 20 Units into the skin at bedtime. E11.65 -     Insulin Pen Needle (B-D UF III MINI PEN NEEDLES) 31G X 5 MM MISC; Use as instructed. Inject into the skin once nightly.  E11.65 Continue blood sugar control as discussed in office today, low carbohydrate diet, and regular physical exercise as tolerated, 150 minutes per week (30 min each day, 5 days per week, or 50 min 3 days per week). Keep blood sugar logs with fasting goal of 90-130 mg/dl, post prandial (after you eat) less than 180.  For Hypoglycemia: BS <60 and Hyperglycemia BS >400; contact the clinic ASAP. Annual eye exams and foot exams are recommended.  Essential hypertension  She declines any antihypertensive refills today  Continue all antihypertensives as prescribed.  Remember to bring in your blood pressure log with you for your follow up appointment.  DASH/Mediterranean Diets are healthier choices for HTN.     Follow Up Instructions Return in about 12 weeks (around 09/02/2019).     I discussed the assessment and treatment plan with the patient. The patient was provided an opportunity to ask questions and all were answered. The patient agreed with the plan and demonstrated an understanding of the instructions.   The patient was advised to call back or seek an in-person evaluation if the symptoms worsen or if the condition fails to improve as anticipated.  I provided 20 minutes of non-face-to-face time during this encounter including median intraservice time, reviewing previous notes, labs, imaging, medications and explaining diagnosis and management.  Gildardo Pounds, FNP-BC

## 2019-06-11 ENCOUNTER — Inpatient Hospital Stay: Payer: Medicare Other | Attending: Oncology | Admitting: Adult Health

## 2019-06-11 DIAGNOSIS — Z170 Estrogen receptor positive status [ER+]: Secondary | ICD-10-CM | POA: Diagnosis not present

## 2019-06-11 DIAGNOSIS — C50411 Malignant neoplasm of upper-outer quadrant of right female breast: Secondary | ICD-10-CM | POA: Diagnosis not present

## 2019-06-11 NOTE — Progress Notes (Signed)
SURVIVORSHIP VIRTUAL VISIT:  I connected with [name] on 06/11/19 at  3:30 PM EDT by telephone and verified that I am speaking with the correct person using two identifiers.  I discussed the limitations, risks, security and privacy concerns of performing an evaluation and management service by telephone and the availability of in person appointments. I also discussed with the patient that there may be a patient responsible charge related to this service. The patient expressed understanding and agreed to proceed.    BRIEF ONCOLOGIC HISTORY:  Oncology History  Malignant neoplasm of upper-outer quadrant of right breast in female, estrogen receptor positive ([place])  09/03/2017 Initial Diagnosis   Screening mammogram detected 1.4 cm right breast mass at 10 o'clock position, additional pleomorphic calcifications 2 cm posteriorly, additional loosely grouped calcifications 10.1 x 7.1 x 5.3 cm, single right axillary lymph node: Biopsy revealed DCIS ER 100%, PR 50%; IDC grade 2-3 ER 100%, PR 50%, Ki-67 15%, HER-2 positive ratio 2, copy #5, lymph node negative T2 N0 stage Ia clinical stage AJCC 8   09/03/2017 Cancer Staging   Staging form: Breast, AJCC 8th Edition - Clinical stage from 09/03/2017: Stage IB (cT2, cN0, cM0, G3, ER+, PR+, HER2+)   11/27/2017 Surgery   Right mastectomy ([place]) ([id number]): IDC grade 2, 1.6 cm, separate foci of DCIS intermediate grade, margins negative, 1/5 LN positive for micrometasatic carcinoma. ER 100%, PR 50%, Ki-67 15%, HER-2 positive ratio 2, copy #5 T1c N0 stage Ia   11/27/2017 Cancer Staging   Staging form: Breast, AJCC 8th Edition - Pathologic stage from 11/27/2017: Stage IA (pT1c, pN0, cM0, G2, ER+, PR+, HER2+)     02/07/2018 - 03/12/2019 Chemotherapy   trastuzumab (HERCEPTIN) 483 mg in sodium chloride 0.9 % 250 mL chemo infusion, 8 mg/kg = 483 mg, Intravenous,  Once, 11 of 11 cycles. Administration: 483 mg (02/07/2018), 357 mg (02/27/2018), 357 mg (05/01/2018), 357  mg (05/23/2018), 357 mg (06/12/2018), 357 mg (07/03/2018), 357 mg (08/14/2018), 357 mg (09/04/2018), 357 mg (10/17/2018), 357 mg (03/20/2018), 357 mg (04/10/2018)  trastuzumab-anns (KANJINTI) 357 mg in sodium chloride 0.9 % 250 mL chemo infusion, 6 mg/kg = 357 mg (100 % of original dose 6 mg/kg), Intravenous,  Once, 7 of 7 cycles. Dose modification: 6 mg/kg (original dose 6 mg/kg, Cycle 12, Reason: Other (see comments)). Administration: 357 mg (11/06/2018), 357 mg (11/27/2018), 357 mg (12/18/2018), 357 mg (01/08/2019), 357 mg (01/29/2019), 357 mg (02/19/2019), 357 mg (03/12/2019).   12/2018 - 12/2023 Anti-estrogen oral therapy   Anastrozole     INTERVAL HISTORY:  [name] to review her survivorship care plan detailing her treatment course for breast cancer, as well as monitoring long-term side effects of that treatment, education regarding health maintenance, screening, and overall wellness and health promotion.     Overall, [name] reports feeling quite well.  She completed a survivorship survey and screened positive for sleep issues, thinking changes, and mild shortness of breath.  These issues are mild.  She drinks one beer per day typically on Fridays.  She is taking Anastrozole and is tolerating this well.    REVIEW OF SYSTEMS:  Review of Systems  Constitutional: Negative for appetite change, chills, fatigue, fever and unexpected weight change.  HENT:   Negative for hearing loss and lump/mass.   Eyes: Negative for eye problems and icterus.  Respiratory: Positive for shortness of breath (mild and remains at baseline). Negative for chest tightness and cough.   Cardiovascular: Negative for chest pain, leg swelling and palpitations.  Gastrointestinal: Negative  for abdominal distention, abdominal pain, constipation, diarrhea, nausea and vomiting.  Endocrine: Negative for hot flashes.  Genitourinary: Negative for difficulty urinating.   Musculoskeletal: Negative for arthralgias.  Skin: Negative for  itching and rash.  Neurological: Negative for dizziness, extremity weakness, headaches and numbness.  Hematological: Negative for adenopathy. Does not bruise/bleed easily.  Psychiatric/Behavioral: Negative for depression. The patient is not nervous/anxious.    Breast: Denies any new nodularity, masses, tenderness, nipple changes, or nipple discharge.      ONCOLOGY TREATMENT TEAM:  1. Surgeon:  Dr. Excell Seltzer at [facility] 2. Medical Oncologist: Dr. Lindi Adie      PAST MEDICAL/SURGICAL HISTORY:  Past Medical History:  Diagnosis Date  . Ambulates with cane    straight  . Angina    no current problems per patient at PAT appt 11/05/18  . Arthritis    HANDS"  . Asthma   . Cancer ([place]) 09/04/2017   Right Breast  . Carpal tunnel syndrome, bilateral 11/26/2016  . Cholelithiasis   . Cocaine abuse ([place]) 07/2012   per E.R. drug screen  . COPD (chronic obstructive pulmonary disease) ([place])    per patient  . Coronary artery disease   . Diabetes mellitus without mention of complication    type 2  . Dysphagia    esophageal dysmotility on 03/2011 esophagram   . Dyspnea    with exertion  . Fatty liver disease, nonalcoholic 5170   on Imaging.   Marland Kitchen GERD (gastroesophageal reflux disease)   . Headache   . Hearing loss    bilateral - no hearing aids  . History of colonic polyps 2009   adenomatous 2009, HP in 2009, 2011, 2013.   Marland Kitchen Hyperlipidemia   . Hypertension   . Iron deficiency anemia   . Myocardial infarction ([place])    years ago, 6 stents placed  . 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 Wears dentures    full upper and partial lower  . Wears glasses    Past Surgical History:  Procedure Laterality Date  . ABDOMINAL ANGIOGRAM  02/20/2011   Procedure: ABDOMINAL ANGIOGRAM;  Surgeon: Clent Demark, MD;  Location: [facility] CATH LAB;  Service: Cardiovascular;;  . CARPAL TUNNEL RELEASE Right 07/04/2017   Procedure: RIGHT CARPAL TUNNEL RELEASE;  Surgeon: Leanora Cover, MD;   Location: [place];  Service: Orthopedics;  Laterality: Right;  . CARPAL TUNNEL RELEASE Left 09/12/2017   Procedure: LEFT CARPAL TUNNEL RELEASE;  Surgeon: Leanora Cover, MD;  Location: [place];  Service: Orthopedics;  Laterality: Left;  . CHOLECYSTECTOMY N/A 11/11/2018   Procedure: ATTEMPTED LAPAROSCOPIC CHOLECUSTECOMY,  OPEN CHOLECYSTECTOMY;  Surgeon: Coralie Keens, MD;  Location: [place];  Service: General;  Laterality: N/A;  . COLONOSCOPY  11/30/2011   Procedure: COLONOSCOPY;  Surgeon: Lafayette Dragon, MD;  Location: [facility] ENDOSCOPY;  Service: Endoscopy;  Laterality: N/A;  . CORONARY ANGIOPLASTY WITH STENT PLACEMENT  02/20/2011  . ERCP N/A 10/03/2018   Procedure: ENDOSCOPIC RETROGRADE CHOLANGIOPANCREATOGRAPHY (ERCP);  Surgeon: Ladene Artist, MD;  Location: [facility];  Service: Endoscopy;  Laterality: N/A;  . ESOPHAGOGASTRODUODENOSCOPY  11/30/2011   Procedure: ESOPHAGOGASTRODUODENOSCOPY (EGD);  Surgeon: Lafayette Dragon, MD;  Location: [facility] ENDOSCOPY;  Service: Endoscopy;  Laterality: N/A;  . ESOPHAGOGASTRODUODENOSCOPY N/A 02/23/2015   Procedure: ESOPHAGOGASTRODUODENOSCOPY (EGD);  Surgeon: Gatha Mayer, MD;  Location[facility] ENDOSCOPY;  Service: Endoscopy;  Laterality: N/A;  . EYE SURGERY Bilateral    cataracts removed  . INTRAOPERATIVE CHOLANGIOGRAM N/A 11/11/2018   Procedure:  Intraoperative Cholangiogram;  Surgeon: Coralie Keens, MD;  Location: [place];  Service: General;  Laterality: N/A;  . LEFT HEART CATHETERIZATION WITH CORONARY ANGIOGRAM N/A 02/20/2011   Procedure: LEFT HEART CATHETERIZATION WITH CORONARY ANGIOGRAM;  Surgeon: Clent Demark, MD;  Location: [place] CATH LAB;  Service: Cardiovascular;  Laterality: N/A;  . MASTECTOMY Right 11/05/2017  . MASTECTOMY W/ SENTINEL NODE BIOPSY Right 11/27/2017  . MASTECTOMY W/ SENTINEL NODE BIOPSY Right 11/27/2017   Procedure: RIGHT TOTAL MASTECTOMY WITH SENTINEL LYMPH NODE BIOPSY;  Surgeon: Excell Seltzer, MD;   Location: [place];  Service: General;  Laterality: Right;  . PORT A CATH REVISION Left 05/07/2018   Procedure: PORT A CATH REVISION;  Surgeon: Excell Seltzer, MD;  Location: [facility] ORS;  Service: General;  Laterality: Left;  . PORTACATH PLACEMENT Left 11/27/2017   Procedure: INSERTION PORT-A-CATH;  Surgeon: Excell Seltzer, MD;  Location: [place];  Service: General;  Laterality: Left;  . RIGHT COLECTOMY  02/2007   for post polypectomy colonic perforation  . SPHINCTEROTOMY  10/03/2018   Procedure: SPHINCTEROTOMY;  Surgeon: Ladene Artist, MD;  Location: [facility] ENDOS[facility];  Service: Endoscopy;;  . TUBAL LIGATION       ALLERGIES:  No Known Allergies   CURRENT MEDICATIONS:  Outpatient Encounter Medications as of 06/11/2019  Medication Sig  . albuterol (PROVENTIL) (2.5 MG/3ML) 0.083% nebulizer solution Take 2.5 mg by nebulization every 6 (six) hours as needed for wheezing or shortness of breath.  . allopurinol (ZYLOPRIM) 300 MG tablet Take 300 mg by mouth every evening.   Marland Kitchen ALPRAZolam (XANAX) 0.25 MG tablet Take 0.25 mg by mouth at bedtime.   Marland Kitchen amLODipine (NORVASC) 5 MG tablet Take 5 mg by mouth daily.  Marland Kitchen anastrozole (ARIMIDEX) 1 MG tablet Take 1 tablet (1 mg total) by mouth daily.  Marland Kitchen aspirin 81 MG EC tablet Take 1 tablet (81 mg total) by mouth daily.  Marland Kitchen atorvastatin (LIPITOR) 20 MG tablet Take 20 mg by mouth daily at 6 PM.   . budesonide-formoterol (SYMBICORT) 160-4.5 MCG/ACT inhaler Inhale 1 puff into the lungs 2 (two) times daily as needed (respiratory issues.).   . Insulin Pen Needle (B-D UF III MINI PEN NEEDLES) 31G X 5 MM MISC Use as instructed. Inject into the skin once nightly.  E11.65  . LANTUS SOLOSTAR 100 UNIT/ML Solostar Pen Inject 20 Units into the skin at bedtime. E11.65  . lidocaine (LIDODERM) 5 % 1 patch daily.  Marland Kitchen loperamide (IMODIUM) 2 MG capsule Take 1 capsule (2 mg total) by mouth 4 (four) times daily as needed for diarrhea or loose stools.  Marland Kitchen losartan (COZAAR) 25 MG tablet Take  25 mg by mouth daily.  . magnesium oxide (MAG-OX) 400 (241.3 Mg) MG tablet Take 1 tablet (400 mg total) by mouth 2 (two) times daily.  . metoprolol succinate (TOPROL-XL) 50 MG 24 hr tablet Take 50 mg by mouth daily. Take with or immediately following a meal.  . Multiple Vitamin (MULTIVITAMIN WITH MINERALS) TABS tablet Take 1 tablet by mouth daily. [place]  . nitroGLYCERIN (NITROSTAT) 0.4 MG SL tablet Place 0.4 mg under the tongue every 5 (five) minutes as needed for chest pain.  . Omega-3 Fatty Acids (FISH OIL) 1000 MG CAPS Take 1,000 mg by mouth daily.  . ondansetron (ZOFRAN) 8 MG tablet Take 1 tablet (8 mg total) by mouth every 8 (eight) hours as needed for nausea or vomiting.  . pantoprazole (PROTONIX) 40 MG tablet Take 40 mg by mouth daily.  . traMADol (ULTRAM) 50 MG  tablet Take 1 tablet (50 mg total) by mouth every 6 (six) hours as needed for moderate pain.  . [DISCONTINUED] LANTUS SOLOSTAR 100 UNIT/ML Solostar Pen Inject 20 Units into the skin at bedtime.    No facility-administered encounter medications on file as of 06/11/2019.     ONCOLOGIC FAMILY HISTORY:  Family History  Problem Relation Age of Onset  . Heart disease Mother   . Diabetes Mother   . Cancer Father        unsure what kind  . Diabetes Sister   . Colon cancer Neg Hx   . Breast cancer Neg Hx      GENETIC COUNSELING/TESTING: Not at this time  SOCIAL HISTORY:  Social History   Socioeconomic History  . Marital status: Divorced    Spouse name: Not on file  . Number of children: 2  . Years of education: Not on file  . Highest education level: Not on file  Occupational History  . Occupation: retired. prev worked in housekeeping w/ [employer]  Tobacco Use  . Smoking status: Former Smoker    Packs/day: 0.50    Years: 47.00    Pack years: 23.50    Types: Cigarettes    Quit date: 05/09/2010    Years since quitting: 9.0  . Smokeless tobacco: Never Used  Substance and Sexual Activity  . Alcohol use: Yes  . Drug  use: Not Currently    Types: Cocaine    Comment: positive drug screen (cocaine, benzo's)07/2012 in emergency room  . Sexual activity: Not Currently    Birth control/protection: Post-menopausal  Other Topics Concern  . Not on file  Social History Narrative   Caffeine use daily.   Social Determinants of Health   Financial Resource Strain:   . Difficulty of Paying Living Expenses:   Food Insecurity:   . Worried About Charity fundraiser in the Last Year:   . Arboriculturist in the Last Year:   Transportation Needs:   . Film/video editor (Medical):   Marland Kitchen Lack of Transportation (Non-Medical):   Physical Activity:   . Days of Exercise per Week:   . Minutes of Exercise per Session:   Stress:   . Feeling of Stress :   Social Connections:   . Frequency of Communication with Friends and Family:   . Frequency of Social Gatherings with Friends and Family:   . Attends Religious Services:   . Active Member of Clubs or Organizations:   . Attends Archivist Meetings:   Marland Kitchen Marital Status:   Intimate Partner Violence:   . Fear of Current or Ex-Partner:   . Emotionally Abused:   Marland Kitchen Physically Abused:   . Sexually Abused:      OBSERVATIONS/OBJECTIVE:  Patient sounds well.  She is in no apparent distress.  Breathing is non labored and mood and behavior are normal.     LABORATORY DATA:  None for this visit.  DIAGNOSTIC IMAGING:  None for this visit.      ASSESSMENT AND PLAN:  [name] is a pleasant 69 y.o. female with Stage IA right breast invasive ductal carcinoma, ER+/PR+/HER2+, diagnosed in 08/2017, treated with mastectomy, maintenance trastuzumab, and anti-estrogen therapy with Anastrozole beginning in 12/2018.  She presents to the Survivorship Clinic for our initial meeting and routine follow-up post-completion of treatment for breast cancer.    1. Stage IA right breast cancer:  [name] is continuing to recover from definitive treatment for breast cancer. She will  follow-up with  her medical oncologist, Dr. Lindi Adie in 6 months with history and physical exam per surveillance protocol.  She will continue her anti-estrogen therapy with anastrozole. Thus far, she is tolerating the Anastrozole well, with minimal side effects. She was instructed to make Dr. Lindi Adie or myself aware if she begins to experience any worsening side effects of the medication and I could see her back in clinic to help manage those side effects, as needed. Her left breast screening mammogram is due.   Today, a comprehensive survivorship care plan and treatment summary was reviewed with the patient today detailing her breast cancer diagnosis, treatment course, potential late/long-term effects of treatment, appropriate follow-up care with recommendations for the future, and patient education resources.  A copy of this summary, along with a letter will be sent to the patient's primary care provider via mail/fax/In Basket message after today's visit.    2. Bone health:  Given [name] age/history of breast cancer and her current treatment regimen including anti-estrogen therapy with Anastrozole, she is at risk for bone demineralization.  Her last DEXA scan was 04/2018, which showed a normal bone density.  She will be due again in 04/2020.  In the meantime, she was encouraged to increase her consumption of foods rich in calcium, as well as increase her weight-bearing activities.  She was given education on specific activities to promote bone health.  3. Cancer screening:  Due to [name]'s history and her age, she should receive screening for skin cancers, colon cancer, and gynecologic cancers.  The information and recommendations are listed on the patient's comprehensive care plan/treatment summary and were reviewed in detail with the patient.    4. Health maintenance and wellness promotion: [name] was encouraged to consume 5-7 servings of fruits and vegetables per day. We reviewed the "Nutrition  [employer]" handout, as well as the handout "Take Control of Your Health and Reduce Your Cancer Risk" from the [employer].  She was also encouraged to engage in moderate to vigorous exercise for 30 minutes per day most days of the week. We discussed the [employer] fitness program, which is designed for cancer survivors to help them become more physically fit after cancer treatments.  She was instructed to limit her alcohol consumption and continue to abstain from tobacco use.     5. Support services/counseling: It is not uncommon for this period of the patient's cancer care trajectory to be one of many emotions and stressors.  We discussed how this can be increasingly difficult during the times of quarantine and social distancing due to the COVID-19 pandemic.   She was given information regarding our available services and encouraged to contact me with any questions or for help enrolling in any of our support group/programs.    Follow up instructions:    -Return to cancer center in 6 months for f/u with Dr. Lindi Adie  -Continue left breast screening mammograms -F/u with me in 1 year -Bone density in 04/2020 Consider referral back to survivorship as a long-term survivor for continued surveillance  The patient was provided an opportunity to ask questions and all were answered. The patient agreed with the plan and demonstrated an understanding of the instructions.   The patient was advised to call back or seek an in-person evaluation if the symptoms worsen or if the condition fails to improve as anticipated.   I provided 25 minutes of non face-to-face telephone visit time during this encounter, and > 50% was spent counseling as documented under my  assessment & plan.  Wilber Bihari, NP 06/11/19 8:39 AM Medical Oncology and Hematology [facility] [place], [place] [address] Tel. [phone]    Fax. [phone]  *Total Encounter Time as defined by the  Centers for Medicare and Medicaid Services includes, in addition to the face-to-face time of a patient visit (documented in the note above) non-face-to-face time: obtaining and reviewing outside history, ordering and reviewing medications, tests or procedures, care coordination (communications with other health care professionals or caregivers) and documentation in the medical record.

## 2019-06-12 ENCOUNTER — Other Ambulatory Visit: Payer: Self-pay

## 2019-06-12 ENCOUNTER — Telehealth: Payer: Self-pay | Admitting: Adult Health

## 2019-06-12 NOTE — Telephone Encounter (Signed)
No 5/6 los. No changes made to pt's schedule.

## 2019-08-27 ENCOUNTER — Telehealth: Payer: Self-pay | Admitting: *Deleted

## 2019-08-27 NOTE — Telephone Encounter (Signed)
Received call from pt with hx of right mastectomy with complaint of right breast area pain.  Pt states she has been moving furniture in her living room and hanging curtains.  Per MD pt to take tylenol as prescribed on the back of the bottle, apple warm compresses, and rest as much as possible.  Pt educated to all the office if symptoms do not improve over the next week.  Pt verbalized understanding and appreciative of the advice.

## 2019-09-01 DIAGNOSIS — E785 Hyperlipidemia, unspecified: Secondary | ICD-10-CM | POA: Diagnosis not present

## 2019-09-01 DIAGNOSIS — I10 Essential (primary) hypertension: Secondary | ICD-10-CM | POA: Diagnosis not present

## 2019-09-01 DIAGNOSIS — E119 Type 2 diabetes mellitus without complications: Secondary | ICD-10-CM | POA: Diagnosis not present

## 2019-09-01 DIAGNOSIS — I25118 Atherosclerotic heart disease of native coronary artery with other forms of angina pectoris: Secondary | ICD-10-CM | POA: Diagnosis not present

## 2019-09-01 DIAGNOSIS — N189 Chronic kidney disease, unspecified: Secondary | ICD-10-CM | POA: Diagnosis not present

## 2019-09-09 DIAGNOSIS — H905 Unspecified sensorineural hearing loss: Secondary | ICD-10-CM | POA: Diagnosis not present

## 2019-09-11 ENCOUNTER — Ambulatory Visit: Payer: Medicare Other | Attending: Nurse Practitioner | Admitting: Nurse Practitioner

## 2019-09-11 ENCOUNTER — Other Ambulatory Visit: Payer: Self-pay

## 2019-09-11 ENCOUNTER — Encounter: Payer: Self-pay | Admitting: Nurse Practitioner

## 2019-09-11 VITALS — BP 136/79 | HR 81 | Temp 97.7°F | Ht 59.0 in | Wt 138.0 lb

## 2019-09-11 DIAGNOSIS — I10 Essential (primary) hypertension: Secondary | ICD-10-CM | POA: Diagnosis not present

## 2019-09-11 DIAGNOSIS — Z794 Long term (current) use of insulin: Secondary | ICD-10-CM | POA: Diagnosis not present

## 2019-09-11 DIAGNOSIS — E785 Hyperlipidemia, unspecified: Secondary | ICD-10-CM | POA: Diagnosis not present

## 2019-09-11 DIAGNOSIS — E119 Type 2 diabetes mellitus without complications: Secondary | ICD-10-CM

## 2019-09-11 LAB — GLUCOSE, POCT (MANUAL RESULT ENTRY): POC Glucose: 197 mg/dl — AB (ref 70–99)

## 2019-09-11 LAB — POCT GLYCOSYLATED HEMOGLOBIN (HGB A1C): Hemoglobin A1C: 7.4 % — AB (ref 4.0–5.6)

## 2019-09-11 NOTE — Progress Notes (Signed)
poct c

## 2019-09-11 NOTE — Progress Notes (Signed)
Assessment & Plan:  [name] was seen today for follow-up.  Diagnoses and all orders for this visit:  Insulin dependent type 2 diabetes mellitus (HCC) -     Glucose (CBG) -     HgB A1c -     CMP14+EGFR -     Lipid panel  Essential hypertension Continue all antihypertensives as prescribed.  Remember to bring in your blood pressure log with you for your follow up appointment.  DASH/Mediterranean Diets are healthier choices for HTN.    Dyslipidemia, goal LDL below 70 INSTRUCTIONS: Work on a low fat, heart healthy diet and participate in regular aerobic exercise program by working out at least 150 minutes per week; 5 days a week-30 minutes per day. Avoid red meat/beef/steak,  fried foods. junk foods, sodas, sugary drinks, unhealthy snacking, alcohol and smoking.  Drink at least 80 oz of water per day and monitor your carbohydrate intake daily.      Patient has been counseled on age-appropriate routine health concerns for screening and prevention. These are reviewed and up-to-date. Referrals have been placed accordingly. Immunizations are up-to-date or declined.    Subjective:   Chief Complaint  Patient presents with  . Follow-up    Pt. is here to follow up on diabetes follow up.    HPI [name] 69 y.o. female presents to office today for diabetes f/u. PMH significant for  Cancer (09/04/2017), Carpal tunnel syndrome, bilateral (11/26/2016), Cholelithiasis, Cocaine abuse (07/2012), COPD, Coronary artery disease, IDDM, Fatty liver disease, nonalcoholic (4196), GERD (gastroesophageal reflux disease), Headache, Hearing loss, History of colonic polyps (2009), Hyperlipidemia, Hypertension, Iron deficiency anemia, Myocardial infarction  Currently on anti estrogen oral therapy for malignant neoplasm of upper outer quadrant of right breast.   DM TYPE 2 Monitoring blood glucose levels twice per day and reports readings as "high".  Endorses medication adherence taking lantus 20 units at  night. On ACE and ARB per ADA guidelines. LDL at goal.  Lab Results  Component Value Date   HGBA1C 7.4 (A) 09/11/2019   Lab Results  Component Value Date   HGBA1C 6.0 (H) 12/08/2018   Lab Results  Component Value Date   LDLCALC 32 07/13/2018    Essential Hypertension Well controlled. She endorses adherence taking toprol xl 50 mg daily, losartan 25 mg daily and amlodipine 5 mg daily. Denies chest pain, shortness of breath, palpitations, lightheadedness, dizziness, headaches or BLE edema.  BP Readings from Last 3 Encounters:  09/11/19 136/79  05/29/19 96/74  03/12/19 (!) 154/95   Review of Systems  Constitutional: Negative for fever, malaise/fatigue and weight loss.  HENT: Negative.  Negative for nosebleeds.   Eyes: Negative.  Negative for blurred vision, double vision and photophobia.  Respiratory: Negative.  Negative for cough and shortness of breath.   Cardiovascular: Negative.  Negative for chest pain, palpitations and leg swelling.  Gastrointestinal: Negative.  Negative for heartburn, nausea and vomiting.  Musculoskeletal: Negative.  Negative for myalgias.  Neurological: Negative.  Negative for dizziness, focal weakness, seizures and headaches.  Psychiatric/Behavioral: Negative.  Negative for suicidal ideas.    Past Medical History:  Diagnosis Date  . Ambulates with cane    straight  . Angina    no current problems per patient at PAT appt 11/05/18  . Arthritis    HANDS"  . Asthma   . Cancer ([place]) 09/04/2017   Right Breast  . Carpal tunnel syndrome, bilateral 11/26/2016  . Cholelithiasis   . Cocaine abuse ([place]) 07/2012   per E.R. drug  screen  . COPD (chronic obstructive pulmonary disease) ([place])    per patient  . Coronary artery disease   . Diabetes mellitus without mention of complication    type 2  . Dysphagia    esophageal dysmotility on 03/2011 esophagram   . Dyspnea    with exertion  . Fatty liver disease, nonalcoholic 1761   on Imaging.   Marland Kitchen GERD  (gastroesophageal reflux disease)   . Headache   . Hearing loss    bilateral - no hearing aids  . History of colonic polyps 2009   adenomatous 2009, HP in 2009, 2011, 2013.   Marland Kitchen Hyperlipidemia   . Hypertension   . Iron deficiency anemia   . Myocardial infarction ([place])    years ago, 6 stents placed  . 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 Wears dentures    full upper and partial lower  . Wears glasses     Past Surgical History:  Procedure Laterality Date  . ABDOMINAL ANGIOGRAM  02/20/2011   Procedure: ABDOMINAL ANGIOGRAM;  Surgeon: Clent Demark, MD;  Location: [facility] CATH LAB;  Service: Cardiovascular;;  . CARPAL TUNNEL RELEASE Right 07/04/2017   Procedure: RIGHT CARPAL TUNNEL RELEASE;  Surgeon: Leanora Cover, MD;  Location: [place];  Service: Orthopedics;  Laterality: Right;  . CARPAL TUNNEL RELEASE Left 09/12/2017   Procedure: LEFT CARPAL TUNNEL RELEASE;  Surgeon: Leanora Cover, MD;  Location: [place];  Service: Orthopedics;  Laterality: Left;  . CHOLECYSTECTOMY N/A 11/11/2018   Procedure: ATTEMPTED LAPAROSCOPIC CHOLECUSTECOMY,  OPEN CHOLECYSTECTOMY;  Surgeon: Coralie Keens, MD;  Location: [place];  Service: General;  Laterality: N/A;  . COLONOSCOPY  11/30/2011   Procedure: COLONOSCOPY;  Surgeon: Lafayette Dragon, MD;  Location: [facility] ENDOSCOPY;  Service: Endoscopy;  Laterality: N/A;  . CORONARY ANGIOPLASTY WITH STENT PLACEMENT  02/20/2011  . ERCP N/A 10/03/2018   Procedure: ENDOSCOPIC RETROGRADE CHOLANGIOPANCREATOGRAPHY (ERCP);  Surgeon: Ladene Artist, MD;  Location: [facility];  Service: Endoscopy;  Laterality: N/A;  . ESOPHAGOGASTRODUODENOSCOPY  11/30/2011   Procedure: ESOPHAGOGASTRODUODENOSCOPY (EGD);  Surgeon: Lafayette Dragon, MD;  Location: [facility];  Service: Endoscopy;  Laterality: N/A;  . ESOPHAGOGASTRODUODENOSCOPY N/A 02/23/2015   Procedure: ESOPHAGOGASTRODUODENOSCOPY (EGD);  Surgeon: Gatha Mayer, MD;  Location[facility] ENDOSCOPY;   Service: Endoscopy;  Laterality: N/A;  . EYE SURGERY Bilateral    cataracts removed  . INTRAOPERATIVE CHOLANGIOGRAM N/A 11/11/2018   Procedure: Intraoperative Cholangiogram;  Surgeon: Coralie Keens, MD;  Location: [place];  Service: General;  Laterality: N/A;  . LEFT HEART CATHETERIZATION WITH CORONARY ANGIOGRAM N/A 02/20/2011   Procedure: LEFT HEART CATHETERIZATION WITH CORONARY ANGIOGRAM;  Surgeon: Clent Demark, MD;  Location: [place] CATH LAB;  Service: Cardiovascular;  Laterality: N/A;  . MASTECTOMY Right 11/05/2017  . MASTECTOMY W/ SENTINEL NODE BIOPSY Right 11/27/2017  . MASTECTOMY W/ SENTINEL NODE BIOPSY Right 11/27/2017   Procedure: RIGHT TOTAL MASTECTOMY WITH SENTINEL LYMPH NODE BIOPSY;  Surgeon: Excell Seltzer, MD;  Location: [place];  Service: General;  Laterality: Right;  . PORT A CATH REVISION Left 05/07/2018   Procedure: PORT A CATH REVISION;  Surgeon: Excell Seltzer, MD;  Location: [facility];  Service: General;  Laterality: Left;  . PORTACATH PLACEMENT Left 11/27/2017   Procedure: INSERTION PORT-A-CATH;  Surgeon: Excell Seltzer, MD;  Location: [place];  Service: General;  Laterality: Left;  . RIGHT COLECTOMY  02/2007   for post polypectomy colonic perforation  . SPHINCTEROTOMY  10/03/2018   Procedure: SPHINCTEROTOMY;  Surgeon:  Ladene Artist, MD;  Location: [facility];  Service: Endoscopy;;  . TUBAL LIGATION      Family History  Problem Relation Age of Onset  . Heart disease Mother   . Diabetes Mother   . Cancer Father        unsure what kind  . Diabetes Sister   . Colon cancer Neg Hx   . Breast cancer Neg Hx     Social History Reviewed with no changes to be made today.   Outpatient Medications Prior to Visit  Medication Sig Dispense Refill  . albuterol (PROVENTIL) (2.5 MG/3ML) 0.083% nebulizer solution Take 2.5 mg by nebulization every 6 (six) hours as needed for wheezing or shortness of breath.    . allopurinol (ZYLOPRIM) 300 MG tablet Take 300 mg by mouth  every evening.     Marland Kitchen ALPRAZolam (XANAX) 0.25 MG tablet Take 0.25 mg by mouth at bedtime.     Marland Kitchen amLODipine (NORVASC) 5 MG tablet Take 5 mg by mouth daily.    Marland Kitchen anastrozole (ARIMIDEX) 1 MG tablet Take 1 tablet (1 mg total) by mouth daily. 90 tablet 3  . aspirin 81 MG EC tablet Take 1 tablet (81 mg total) by mouth daily. 30 tablet 0  . atorvastatin (LIPITOR) 20 MG tablet Take 20 mg by mouth daily at 6 PM.     . budesonide-formoterol (SYMBICORT) 160-4.5 MCG/ACT inhaler Inhale 1 puff into the lungs 2 (two) times daily as needed (respiratory issues.).     . Insulin Pen Needle (B-D UF III MINI PEN NEEDLES) 31G X 5 MM MISC Use as instructed. Inject into the skin once nightly.  E11.65 90 each 6  . LANTUS SOLOSTAR 100 UNIT/ML Solostar Pen Inject 20 Units into the skin at bedtime. E11.65 15 mL 6  . loperamide (IMODIUM) 2 MG capsule Take 1 capsule (2 mg total) by mouth 4 (four) times daily as needed for diarrhea or loose stools. 12 capsule 0  . losartan (COZAAR) 25 MG tablet Take 25 mg by mouth daily.    . magnesium oxide (MAG-OX) 400 (241.3 Mg) MG tablet Take 1 tablet (400 mg total) by mouth 2 (two) times daily. 60 tablet 0  . metoprolol succinate (TOPROL-XL) 50 MG 24 hr tablet Take 50 mg by mouth daily. Take with or immediately following a meal.    . Multiple Vitamin (MULTIVITAMIN WITH MINERALS) TABS tablet Take 1 tablet by mouth daily. Sentry    . nitroGLYCERIN (NITROSTAT) 0.4 MG SL tablet Place 0.4 mg under the tongue every 5 (five) minutes as needed for chest pain.    . Omega-3 Fatty Acids (FISH OIL) 1000 MG CAPS Take 1,000 mg by mouth daily.    . ondansetron (ZOFRAN) 8 MG tablet Take 1 tablet (8 mg total) by mouth every 8 (eight) hours as needed for nausea or vomiting. 20 tablet 0  . pantoprazole (PROTONIX) 40 MG tablet Take 40 mg by mouth daily.    . traMADol (ULTRAM) 50 MG tablet Take 1 tablet (50 mg total) by mouth every 6 (six) hours as needed for moderate pain. 30 tablet 0  . lidocaine (LIDODERM) 5  % 1 patch daily. (Patient not taking: Reported on 09/11/2019)     No facility-administered medications prior to visit.    No Known Allergies     Objective:    BP 136/79 (BP Location: Left Arm, Patient Position: Sitting, Cuff Size: Normal)   Pulse 81   Temp 97.7 F (36.5 C) (Temporal)   Ht '4\' 11"'$  (  1.499 m)   Wt 138 lb (62.6 kg)   LMP  (LMP Unknown) Comment: tubal ligation  SpO2 97%   BMI 27.87 kg/m  Wt Readings from Last 3 Encounters:  09/11/19 138 lb (62.6 kg)  05/29/19 138 lb 12.8 oz (63 kg)  03/12/19 132 lb 9.6 oz (60.1 kg)    Physical Exam Vitals and nursing note reviewed.  Constitutional:      Appearance: She is well-developed.  HENT:     Head: Normocephalic and atraumatic.  Cardiovascular:     Rate and Rhythm: Normal rate and regular rhythm.     Heart sounds: Normal heart sounds. No murmur heard.  No friction rub. No gallop.   Pulmonary:     Effort: Pulmonary effort is normal. No tachypnea or respiratory distress.     Breath sounds: Normal breath sounds. No decreased breath sounds, wheezing, rhonchi or rales.  Chest:     Chest wall: No tenderness.  Abdominal:     General: Bowel sounds are normal.     Palpations: Abdomen is soft.  Musculoskeletal:        General: Normal range of motion.     Cervical back: Normal range of motion.  Skin:    General: Skin is warm and dry.  Neurological:     Mental Status: She is alert and oriented to person, place, and time.     Coordination: Coordination normal.  Psychiatric:        Behavior: Behavior normal. Behavior is cooperative.        Thought Content: Thought content normal.        Judgment: Judgment normal.          Patient has been counseled extensively about nutrition and exercise as well as the importance of adherence with medications and regular follow-up. The patient was given clear instructions to go to ER or return to medical center if symptoms don't improve, worsen or new problems develop. The patient  verbalized understanding.   Follow-up: Return in about 3 months (around 12/12/2019).   Gildardo Pounds, FNP-BC [facility], [place]   09/11/2019, 8:54 PM

## 2019-09-12 LAB — LIPID PANEL
Chol/HDL Ratio: 3.5 ratio (ref 0.0–4.4)
Cholesterol, Total: 125 mg/dL (ref 100–199)
HDL: 36 mg/dL — ABNORMAL LOW (ref >39)
LDL Chol Calc (NIH): 26 mg/dL (ref 0–99)
Triglycerides: 457 mg/dL — ABNORMAL HIGH (ref 0–149)
VLDL Cholesterol Cal: 63 mg/dL — ABNORMAL HIGH (ref 5–40)

## 2019-09-12 LAB — CMP14+EGFR
ALT: 18 IU/L (ref 0–32)
AST: 22 IU/L (ref 0–40)
Albumin/Globulin Ratio: 1.2 (ref 1.2–2.2)
Albumin: 4.5 g/dL (ref 3.7–4.7)
Alkaline Phosphatase: 140 IU/L — ABNORMAL HIGH (ref 48–121)
BUN/Creatinine Ratio: 13 (ref 12–28)
BUN: 21 mg/dL (ref 8–27)
Bilirubin Total: 0.6 mg/dL (ref 0.0–1.2)
CO2: 23 mmol/L (ref 20–29)
Calcium: 10.4 mg/dL — ABNORMAL HIGH (ref 8.7–10.3)
Chloride: 99 mmol/L (ref 96–106)
Creatinine, Ser: 1.62 mg/dL — ABNORMAL HIGH (ref 0.57–1.00)
GFR calc Af Amer: 37 mL/min/{1.73_m2} — ABNORMAL LOW (ref >59)
GFR calc non Af Amer: 32 mL/min/{1.73_m2} — ABNORMAL LOW (ref >59)
Globulin, Total: 3.9 g/dL (ref 1.5–4.5)
Glucose: 171 mg/dL — ABNORMAL HIGH (ref 65–99)
Potassium: 5.1 mmol/L (ref 3.5–5.2)
Sodium: 138 mmol/L (ref 134–144)
Total Protein: 8.4 g/dL (ref 6.0–8.5)

## 2019-09-13 ENCOUNTER — Other Ambulatory Visit: Payer: Self-pay | Admitting: Nurse Practitioner

## 2019-09-13 DIAGNOSIS — I10 Essential (primary) hypertension: Secondary | ICD-10-CM

## 2019-09-13 MED ORDER — LOSARTAN POTASSIUM 25 MG PO TABS: 25.0000 mg | ORAL_TABLET | Freq: Every day | ORAL | 0 refills | Status: DC

## 2019-09-13 MED ORDER — AMLODIPINE BESYLATE 5 MG PO TABS: 5.0000 mg | ORAL_TABLET | Freq: Every day | ORAL | 0 refills | Status: DC

## 2019-09-13 MED ORDER — ATORVASTATIN CALCIUM 20 MG PO TABS: 20.0000 mg | ORAL_TABLET | Freq: Every day | ORAL | 1 refills | Status: DC

## 2019-09-13 MED ORDER — METOPROLOL SUCCINATE ER 50 MG PO TB24: 50.0000 mg | ORAL_TABLET | Freq: Every day | ORAL | 1 refills | Status: DC

## 2019-09-13 NOTE — Progress Notes (Signed)
Patient Care Team: Gildardo Pounds, NP as PCP - General (Nurse Practitioner) Nicholas Lose, MD as Consulting Physician (Hematology and Oncology)  DIAGNOSIS:    ICD-10-CM   1. Malignant neoplasm of upper-outer quadrant of right breast in female, estrogen receptor positive ([place])  C50.411 MM DIAG BREAST TOMO UNI LEFT   Z17.0     SUMMARY OF ONCOLOGIC HISTORY: Oncology History  Malignant neoplasm of upper-outer quadrant of right breast in female, estrogen receptor positive ([place])  09/03/2017 Initial Diagnosis   Screening mammogram detected 1.4 cm right breast mass at 10 o'clock position, additional pleomorphic calcifications 2 cm posteriorly, additional loosely grouped calcifications 10.1 x 7.1 x 5.3 cm, single right axillary lymph node: Biopsy revealed DCIS ER 100%, PR 50%; IDC grade 2-3 ER 100%, PR 50%, Ki-67 15%, HER-2 positive ratio 2, copy #5, lymph node negative T2 N0 stage Ia clinical stage AJCC 8   09/03/2017 Cancer Staging   Staging form: Breast, AJCC 8th Edition - Clinical stage from 09/03/2017: Stage IB (cT2, cN0, cM0, G3, ER+, PR+, HER2+)   11/27/2017 Surgery   Right mastectomy ([place]) ([id number]): IDC grade 2, 1.6 cm, separate foci of DCIS intermediate grade, margins negative, 1/5 LN positive for micrometasatic carcinoma. ER 100%, PR 50%, Ki-67 15%, HER-2 positive ratio 2, copy #5 T1c N0 stage Ia   11/27/2017 Cancer Staging   Staging form: Breast, AJCC 8th Edition - Pathologic stage from 11/27/2017: Stage IA (pT1c, pN0, cM0, G2, ER+, PR+, HER2+)     02/07/2018 - 03/12/2019 Chemotherapy   trastuzumab (HERCEPTIN) 483 mg in sodium chloride 0.9 % 250 mL chemo infusion, 8 mg/kg = 483 mg, Intravenous,  Once, 11 of 11 cycles. Administration: 483 mg (02/07/2018), 357 mg (02/27/2018), 357 mg (05/01/2018), 357 mg (05/23/2018), 357 mg (06/12/2018), 357 mg (07/03/2018), 357 mg (08/14/2018), 357 mg (09/04/2018), 357 mg (10/17/2018), 357 mg (03/20/2018), 357 mg (04/10/2018)  trastuzumab-anns (KANJINTI)  357 mg in sodium chloride 0.9 % 250 mL chemo infusion, 6 mg/kg = 357 mg (100 % of original dose 6 mg/kg), Intravenous,  Once, 7 of 7 cycles. Dose modification: 6 mg/kg (original dose 6 mg/kg, Cycle 12, Reason: Other (see comments)). Administration: 357 mg (11/06/2018), 357 mg (11/27/2018), 357 mg (12/18/2018), 357 mg (01/08/2019), 357 mg (01/29/2019), 357 mg (02/19/2019), 357 mg (03/12/2019).   12/2018 - 12/2023 Anti-estrogen oral therapy   Anastrozole     CHIEF COMPLIANT: Follow-upof right breast canceron anastrozole  INTERVAL HISTORY: [name] is a 69 y.o. with above-mentioned history of right breast cancer whounderwent a right mastectomy, Herceptin maintenance, and anti-estrogen therapy withanastrozole. She presents to the clinic today for follow-up.  No palpable lumps or nodules in the left breast.  Denies any side effects to anastrozole therapy.  ALLERGIES:  has No Known Allergies.  MEDICATIONS:  Current Outpatient Medications  Medication Sig Dispense Refill  . albuterol (PROVENTIL) (2.5 MG/3ML) 0.083% nebulizer solution Take 2.5 mg by nebulization every 6 (six) hours as needed for wheezing or shortness of breath.    . allopurinol (ZYLOPRIM) 300 MG tablet Take 300 mg by mouth every evening.     Marland Kitchen ALPRAZolam (XANAX) 0.25 MG tablet Take 0.25 mg by mouth at bedtime.     Marland Kitchen amLODipine (NORVASC) 5 MG tablet Take 1 tablet (5 mg total) by mouth daily. 90 tablet 0  . anastrozole (ARIMIDEX) 1 MG tablet Take 1 tablet (1 mg total) by mouth daily. 90 tablet 3  . aspirin 81 MG EC tablet Take 1 tablet (81 mg total) by mouth  daily. 30 tablet 0  . atorvastatin (LIPITOR) 20 MG tablet Take 1 tablet (20 mg total) by mouth daily at 6 PM. 90 tablet 1  . budesonide-formoterol (SYMBICORT) 160-4.5 MCG/ACT inhaler Inhale 1 puff into the lungs 2 (two) times daily as needed (respiratory issues.).     . Insulin Pen Needle (B-D UF III MINI PEN NEEDLES) 31G X 5 MM MISC Use as instructed. Inject into the skin once  nightly.  E11.65 90 each 6  . LANTUS SOLOSTAR 100 UNIT/ML Solostar Pen Inject 20 Units into the skin at bedtime. E11.65 15 mL 6  . loperamide (IMODIUM) 2 MG capsule Take 1 capsule (2 mg total) by mouth 4 (four) times daily as needed for diarrhea or loose stools. 12 capsule 0  . losartan (COZAAR) 25 MG tablet Take 1 tablet (25 mg total) by mouth daily. 90 tablet 0  . magnesium oxide (MAG-OX) 400 (241.3 Mg) MG tablet Take 1 tablet (400 mg total) by mouth 2 (two) times daily. 60 tablet 0  . metoprolol succinate (TOPROL-XL) 50 MG 24 hr tablet Take 1 tablet (50 mg total) by mouth daily. Take with or immediately following a meal. 90 tablet 1  . Multiple Vitamin (MULTIVITAMIN WITH MINERALS) TABS tablet Take 1 tablet by mouth daily. Sentry    . nitroGLYCERIN (NITROSTAT) 0.4 MG SL tablet Place 0.4 mg under the tongue every 5 (five) minutes as needed for chest pain.    . Omega-3 Fatty Acids (FISH OIL) 1000 MG CAPS Take 1,000 mg by mouth daily.    . ondansetron (ZOFRAN) 8 MG tablet Take 1 tablet (8 mg total) by mouth every 8 (eight) hours as needed for nausea or vomiting. 20 tablet 0  . pantoprazole (PROTONIX) 40 MG tablet Take 40 mg by mouth daily.    . traMADol (ULTRAM) 50 MG tablet Take 1 tablet (50 mg total) by mouth every 6 (six) hours as needed for moderate pain. 30 tablet 0   No current facility-administered medications for this visit.    PHYSICAL EXAMINATION: ECOG PERFORMANCE STATUS: 1 - Symptomatic but completely ambulatory  Vitals:   09/14/19 1356  BP: 124/76  Pulse: 81  Resp: 18  Temp: 97.7 F (36.5 C)  SpO2: 100%   Filed Weights   09/14/19 1356  Weight: 139 lb 3.2 oz (63.1 kg)    BREAST: Right mastectomy scar is palpated without any lumps or nodules but it is tender.  Left breast no palpable lumps or nodules. (exam performed in the presence of a chaperone)  LABORATORY DATA:  I have reviewed the data as listed CMP Latest Ref Rng & Units 09/11/2019 01/29/2019 12/09/2018  Glucose 65  - 99 mg/dL 171(H) 254(H) 105(H)  BUN 8 - 27 mg/dL 21 33(H) 17  Creatinine 0.57 - 1.00 mg/dL 1.62(H) 2.25(H) 1.61(H)  Sodium 134 - 144 mmol/L 138 136 141  Potassium 3.5 - 5.2 mmol/L 5.1 4.8 4.0  Chloride 96 - 106 mmol/L 99 102 109  CO2 20 - 29 mmol/L 23 24 22   Calcium 8.7 - 10.3 mg/dL 10.4(H) 9.4 9.7  Total Protein 6.0 - 8.5 g/dL 8.4 8.5(H) -  Total Bilirubin 0.0 - 1.2 mg/dL 0.6 0.6 -  Alkaline Phos 48 - 121 IU/L 140(H) 120 -  AST 0 - 40 IU/L 22 16 -  ALT 0 - 32 IU/L 18 18 -    Lab Results  Component Value Date   WBC 6.8 03/12/2019   HGB 12.7 03/12/2019   HCT 40.3 03/12/2019   MCV 93.5  03/12/2019   PLT 268 03/12/2019   NEUTROABS 4.0 03/12/2019    ASSESSMENT & PLAN:  Malignant neoplasm of upper-outer quadrant of right breast in female, estrogen receptor positive ([place]) 11/27/2017 right mastectomy: IDC grade 2, 1.6 cm, separate foci of DCIS intermediate grade, margins negative, 0/5 lymph nodes negative,ER 100%, PR 50%, Ki-67 15%, HER-2 positive ratio 2, copy #5 T1c N0 stage Ia  Treatment plan: 1. Adjuvant Herceptin every 3 weeks for 1 year (because of poor performance status Taxol is felt to be intolerable for her), started January 2020-03/12/2019 2. Adjuvant antiestrogen therapy with Anastrozole 1 mg daily started 02/27/2018  Patient's daughter is the primary point of contact for her and all appointment changes will need to go through her. -------------------------------------------------------------------------------------- Hospitalization 10/02/2018-10/07/2018: Acute cholecystitis Hospitalization: 11/11/2018 through 11/18/2018: Chronic cholecystitis with gallstones: Cholecystectomy Hospitalization: 12/07/2020 12/09/2018: Chest pain, VQ scan negative for PE, the predictable chest pain treated with Lidoderm patch  Anastrozole toxicities: Denies any major adverse effects to anastrozole therapy  Breast cancer surveillance: Right mastectomy: Scar appears to be tender but no palpable  lumps or nodules, left breast no palpable lumps or nodules. Left breast mammogram: I ordered a new mammogram to be done on the left breast.     Return to clinic in 1 year for follow-up    Orders Placed This Encounter  Procedures  . MM DIAG BREAST TOMO UNI LEFT    Standing Status:   Future    Standing Expiration Date:   09/13/2020    Order Specific Question:   Reason for Exam (SYMPTOM  OR DIAGNOSIS REQUIRED)    Answer:   annual mammogram with H/O right brest cancer S/P mastectomy    Order Specific Question:   Preferred imaging location?    Answer:   [facility]   The patient has a good understanding of the overall plan. she agrees with it. she will call with any problems that may develop before the next visit here.  Total time spent: 20 mins including face to face time and time spent for planning, charting and coordination of care  Nicholas Lose, MD 09/14/2019  I, Cloyde Reams Dorshimer, am acting as scribe for Dr. Nicholas Lose.  I have reviewed the above documentation for accuracy and completeness, and I agree with the above.

## 2019-09-14 ENCOUNTER — Inpatient Hospital Stay: Payer: Medicare Other | Attending: Oncology | Admitting: Hematology and Oncology

## 2019-09-14 ENCOUNTER — Other Ambulatory Visit: Payer: Self-pay

## 2019-09-14 DIAGNOSIS — Z170 Estrogen receptor positive status [ER+]: Secondary | ICD-10-CM | POA: Diagnosis not present

## 2019-09-14 DIAGNOSIS — Z79899 Other long term (current) drug therapy: Secondary | ICD-10-CM | POA: Insufficient documentation

## 2019-09-14 DIAGNOSIS — Z9221 Personal history of antineoplastic chemotherapy: Secondary | ICD-10-CM | POA: Insufficient documentation

## 2019-09-14 DIAGNOSIS — Z79811 Long term (current) use of aromatase inhibitors: Secondary | ICD-10-CM | POA: Insufficient documentation

## 2019-09-14 DIAGNOSIS — Z9011 Acquired absence of right breast and nipple: Secondary | ICD-10-CM | POA: Diagnosis not present

## 2019-09-14 DIAGNOSIS — C50411 Malignant neoplasm of upper-outer quadrant of right female breast: Secondary | ICD-10-CM | POA: Diagnosis not present

## 2019-09-14 NOTE — Assessment & Plan Note (Signed)
11/27/2017 right mastectomy: IDC grade 2, 1.6 cm, separate foci of DCIS intermediate grade, margins negative, 0/5 lymph nodes negative,ER 100%, PR 50%, Ki-67 15%, HER-2 positive ratio 2, copy #5 T1c N0 stage Ia  Treatment plan: 1. Adjuvant Herceptin every 3 weeks for 1 year (because of poor performance status Taxol is felt to be intolerable for her), started January 2020-03/12/2019 2. Adjuvant antiestrogen therapy with Anastrozole 1 mg daily started 02/27/2018  Patient's daughter is the primary point of contact for her and all appointment changes will need to go through her. -------------------------------------------------------------------------------------- Hospitalization 10/02/2018-10/07/2018: Acute cholecystitis Hospitalization: 11/11/2018 through 11/18/2018: Chronic cholecystitis with gallstones: Cholecystectomy Hospitalization: 12/07/2020 12/09/2018: Chest pain, VQ scan negative for PE, the predictable chest pain treated with Lidoderm patch  Anastrozole toxicities: Denies any major adverse effects to anastrozole therapy  Breast cancer surveillance: Right mastectomy Left breast mammogram: Ordered but not performed  Return to clinic in 1 year for follow-up

## 2019-09-18 ENCOUNTER — Telehealth: Payer: Self-pay | Admitting: Nurse Practitioner

## 2019-09-18 NOTE — Telephone Encounter (Signed)
Spoke to patient and informed on lab results. Pt. Understood.

## 2019-09-18 NOTE — Telephone Encounter (Signed)
Please advise.   Copied from [place] [id number]. Topic: General - Call Back - No Documentation >> Sep 18, 2019 10:32 AM Erick Blinks wrote: Pt called and would like a call back regarding her lab results please advise Best contact: [phone]

## 2019-11-11 DIAGNOSIS — H5213 Myopia, bilateral: Secondary | ICD-10-CM | POA: Diagnosis not present

## 2019-12-01 DIAGNOSIS — N189 Chronic kidney disease, unspecified: Secondary | ICD-10-CM | POA: Diagnosis not present

## 2019-12-01 DIAGNOSIS — I25118 Atherosclerotic heart disease of native coronary artery with other forms of angina pectoris: Secondary | ICD-10-CM | POA: Diagnosis not present

## 2019-12-01 DIAGNOSIS — E785 Hyperlipidemia, unspecified: Secondary | ICD-10-CM | POA: Diagnosis not present

## 2019-12-01 DIAGNOSIS — I10 Essential (primary) hypertension: Secondary | ICD-10-CM | POA: Diagnosis not present

## 2019-12-01 DIAGNOSIS — E119 Type 2 diabetes mellitus without complications: Secondary | ICD-10-CM | POA: Diagnosis not present

## 2019-12-14 ENCOUNTER — Other Ambulatory Visit: Payer: Self-pay

## 2019-12-14 ENCOUNTER — Ambulatory Visit (HOSPITAL_BASED_OUTPATIENT_CLINIC_OR_DEPARTMENT_OTHER): Payer: Medicare Other | Admitting: Pharmacist

## 2019-12-14 ENCOUNTER — Encounter: Payer: Self-pay | Admitting: Nurse Practitioner

## 2019-12-14 ENCOUNTER — Ambulatory Visit: Payer: Medicare Other | Attending: Nurse Practitioner | Admitting: Nurse Practitioner

## 2019-12-14 VITALS — BP 117/78 | HR 109 | Ht 59.0 in | Wt 138.0 lb

## 2019-12-14 DIAGNOSIS — E785 Hyperlipidemia, unspecified: Secondary | ICD-10-CM | POA: Diagnosis not present

## 2019-12-14 DIAGNOSIS — Z794 Long term (current) use of insulin: Secondary | ICD-10-CM

## 2019-12-14 DIAGNOSIS — Z23 Encounter for immunization: Secondary | ICD-10-CM

## 2019-12-14 DIAGNOSIS — E119 Type 2 diabetes mellitus without complications: Secondary | ICD-10-CM

## 2019-12-14 DIAGNOSIS — J418 Mixed simple and mucopurulent chronic bronchitis: Secondary | ICD-10-CM

## 2019-12-14 DIAGNOSIS — I10 Essential (primary) hypertension: Secondary | ICD-10-CM | POA: Diagnosis not present

## 2019-12-14 LAB — POCT GLYCOSYLATED HEMOGLOBIN (HGB A1C): Hemoglobin A1C: 10.9 % — AB (ref 4.0–5.6)

## 2019-12-14 LAB — GLUCOSE, POCT (MANUAL RESULT ENTRY)
POC Glucose: 361 mg/dl — AB (ref 70–99)
POC Glucose: 279 mg/dl — AB (ref 70–99)

## 2019-12-14 MED ORDER — MISC. DEVICES MISC
0 refills | Status: DC
Start: 2019-12-14 — End: 2019-12-14

## 2019-12-14 MED ORDER — VICTOZA 18 MG/3ML ~~LOC~~ SOPN: PEN_INJECTOR | SUBCUTANEOUS | 3 refills | Status: DC

## 2019-12-14 MED ORDER — INSULIN ASPART 100 UNIT/ML ~~LOC~~ SOLN
20.0000 [IU] | Freq: Once | SUBCUTANEOUS | Status: AC
  Administered 2019-12-14: 20 [IU] via SUBCUTANEOUS

## 2019-12-14 MED ORDER — ATORVASTATIN CALCIUM 20 MG PO TABS: 20.0000 mg | ORAL_TABLET | Freq: Every day | ORAL | 1 refills | Status: DC

## 2019-12-14 MED ORDER — MISC. DEVICES MISC: 0 refills | Status: DC

## 2019-12-14 MED ORDER — BD PEN NEEDLE MINI U/F 31G X 5 MM MISC: 6 refills | Status: DC

## 2019-12-14 NOTE — Progress Notes (Signed)
Patient presents for vaccination against influenza  per orders of [employer]. Consent given. Counseling provided. No contraindications exists. Vaccine administered without incident.   Benard Halsted, PharmD, [place] [phone]

## 2019-12-14 NOTE — Progress Notes (Signed)
Assessment & Plan:  [name] was seen today for follow-up.  Diagnoses and all orders for this visit:  Insulin dependent type 2 diabetes mellitus (HCC) -     Glucose (CBG) -     HgB A1c -     insulin aspart (novoLOG) injection 20 Units -     liraglutide (VICTOZA) 18 MG/3ML SOPN; SubQ:  Inject 0.6 mg once daily into the skin on Week 1. Week 2: increase to 1.2 mg once daily; week 3: increase to 1.8 mg once daily -     Insulin Pen Needle (B-D UF III MINI PEN NEEDLES) 31G X 5 MM MISC; Use as instructed. Inject into the skin once nightly.  E11.65 -     Glucose (CBG) Continue blood sugar control as discussed in office today, low carbohydrate diet, and regular physical exercise as tolerated, 150 minutes per week (30 min each day, 5 days per week, or 50 min 3 days per week). Keep blood sugar logs with fasting goal of 90-130 mg/dl, post prandial (after you eat) less than 180.  For Hypoglycemia: BS <60 and Hyperglycemia BS >400; contact the clinic ASAP. Annual eye exams and foot exams are recommended.   Dyslipidemia, goal LDL below 70 -     atorvastatin (LIPITOR) 20 MG tablet; Take 1 tablet (20 mg total) by mouth daily at 6 PM. INSTRUCTIONS: Work on a low fat, heart healthy diet and participate in regular aerobic exercise program by working out at least 150 minutes per week; 5 days a week-30 minutes per day. Avoid red meat/beef/steak,  fried foods. junk foods, sodas, sugary drinks, unhealthy snacking, alcohol and smoking.  Drink at least 80 oz of water per day and monitor your carbohydrate intake daily.    Essential hypertension -     losartan (COZAAR) 25 MG tablet; Take 1 tablet (25 mg total) by mouth daily. Continue all antihypertensives as prescribed.  Remember to bring in your blood pressure log with you for your follow up appointment.  DASH/Mediterranean Diets are healthier choices for HTN.    Mixed simple and mucopurulent chronic bronchitis ([place]) -     Misc. Devices MISC; Please provide  patient with nebulizer mask and tubing. [id number]     Patient has been counseled on age-appropriate routine health concerns for screening and prevention. These are reviewed and up-to-date. Referrals have been placed accordingly. Immunizations are up-to-date or declined.    Subjective:   Chief Complaint  Patient presents with  . Follow-up    Pt. is here for 3 months f.u on diabetes.    HPI [name] 69 y.o. female presents to office today for follow up to IDDM. PMH significant for  Cancer (09/04/2017), Carpal tunnel syndrome, bilateral (11/26/2016), Cholelithiasis, Cocaine abuse (07/2012), COPD, Coronary artery disease, IDDM, Fatty liver disease, nonalcoholic (4098), GERD, CKD,  Hearing loss,  Hyperlipidemia, Hypertension, Iron deficiency anemia, Myocardial infarction  Currently on anti estrogen oral therapy for malignant neoplasm of upper outer quadrant of right breast.   Patient is very hard of hearing. Essential Hypertension Well-controlled today.  She endorses medication adherence taking amlodipine 5 mg daily, Toprol-XL 50 mg daily and losartan 25 mg daily.  She does not monitor her blood pressure at home. Denies chest pain, shortness of breath, palpitations, lightheadedness, dizziness, headaches or BLE edema.  She sees cardiology Dr. Terrence Dupont for her coronary artery disease. BP Readings from Last 3 Encounters:  12/14/19 117/78  09/14/19 124/76  09/11/19 136/79    DM TYPE 2 Poorly controlled.  A1c is up from 7.4-10.9. Glucose is elevated in the office today and she required 20units of NovoLog.  She will continue on lantus 20 units today and I am adding Victoza. 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  Lab Results  Component Value Date   HGBA1C 10.9 (A) 12/14/2019   Lab Results  Component Value Date   HGBA1C 7.4 (A) 09/11/2019   Chronic back Pain She has low back pain with pain radiating to bilateral legs with prolonged  sitting or standing.    Review of Systems  Constitutional: Negative for fever, malaise/fatigue and weight loss.  HENT: Negative.  Negative for nosebleeds.   Eyes: Negative.  Negative for blurred vision, double vision and photophobia.  Respiratory: Negative.  Negative for cough, sputum production, shortness of breath and wheezing.   Cardiovascular: Negative.  Negative for chest pain, palpitations and leg swelling.  Gastrointestinal: Negative.  Negative for heartburn, nausea and vomiting.  Musculoskeletal: Negative.  Negative for myalgias.  Neurological: Negative.  Negative for dizziness, focal weakness, seizures and headaches.  Psychiatric/Behavioral: Negative.  Negative for suicidal ideas.    Past Medical History:  Diagnosis Date  . Ambulates with cane    straight  . Angina    no current problems per patient at PAT appt 11/05/18  . Arthritis    HANDS"  . Asthma   . Cancer ([place]) 09/04/2017   Right Breast  . Carpal tunnel syndrome, bilateral 11/26/2016  . Cholelithiasis   . Cocaine abuse ([place]) 07/2012   per E.R. drug screen  . COPD (chronic obstructive pulmonary disease) ([place])    per patient  . Coronary artery disease   . Diabetes mellitus without mention of complication    type 2  . Dysphagia    esophageal dysmotility on 03/2011 esophagram   . Dyspnea    with exertion  . Fatty liver disease, nonalcoholic 9628   on Imaging.   Marland Kitchen GERD (gastroesophageal reflux disease)   . Headache   . Hearing loss    bilateral - no hearing aids  . History of colonic polyps 2009   adenomatous 2009, HP in 2009, 2011, 2013.   Marland Kitchen Hyperlipidemia   . Hypertension   . Iron deficiency anemia   . Myocardial infarction ([place])    years ago, 6 stents placed  . 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 Wears dentures    full upper and partial lower  . Wears glasses     Past Surgical History:  Procedure Laterality Date  . ABDOMINAL ANGIOGRAM  02/20/2011   Procedure: ABDOMINAL ANGIOGRAM;   Surgeon: Clent Demark, MD;  Location: [facility] CATH LAB;  Service: Cardiovascular;;  . CARPAL TUNNEL RELEASE Right 07/04/2017   Procedure: RIGHT CARPAL TUNNEL RELEASE;  Surgeon: Leanora Cover, MD;  Location: [place];  Service: Orthopedics;  Laterality: Right;  . CARPAL TUNNEL RELEASE Left 09/12/2017   Procedure: LEFT CARPAL TUNNEL RELEASE;  Surgeon: Leanora Cover, MD;  Location: [place];  Service: Orthopedics;  Laterality: Left;  . CHOLECYSTECTOMY N/A 11/11/2018   Procedure: ATTEMPTED LAPAROSCOPIC CHOLECUSTECOMY,  OPEN CHOLECYSTECTOMY;  Surgeon: Coralie Keens, MD;  Location: [place];  Service: General;  Laterality: N/A;  . COLONOSCOPY  11/30/2011   Procedure: COLONOSCOPY;  Surgeon: Lafayette Dragon, MD;  Location: [facility] ENDOSCOPY;  Service: Endoscopy;  Laterality: N/A;  . CORONARY ANGIOPLASTY WITH STENT PLACEMENT  02/20/2011  . ERCP N/A 10/03/2018   Procedure: ENDOSCOPIC RETROGRADE CHOLANGIOPANCREATOGRAPHY (ERCP);  Surgeon: Ladene Artist, MD;  Location: [facility];  Service: Endoscopy;  Laterality: N/A;  . ESOPHAGOGASTRODUODENOSCOPY  11/30/2011   Procedure: ESOPHAGOGASTRODUODENOSCOPY (EGD);  Surgeon: Lafayette Dragon, MD;  Location: [facility];  Service: Endoscopy;  Laterality: N/A;  . ESOPHAGOGASTRODUODENOSCOPY N/A 02/23/2015   Procedure: ESOPHAGOGASTRODUODENOSCOPY (EGD);  Surgeon: Gatha Mayer, MD;  Location: [facility] ENDOSCOPY;  Service: Endoscopy;  Laterality: N/A;  . EYE SURGERY Bilateral    cataracts removed  . INTRAOPERATIVE CHOLANGIOGRAM N/A 11/11/2018   Procedure: Intraoperative Cholangiogram;  Surgeon: Coralie Keens, MD;  Location: [place];  Service: General;  Laterality: N/A;  . LEFT HEART CATHETERIZATION WITH CORONARY ANGIOGRAM N/A 02/20/2011   Procedure: LEFT HEART CATHETERIZATION WITH CORONARY ANGIOGRAM;  Surgeon: Clent Demark, MD;  Location: [place] CATH LAB;  Service: Cardiovascular;  Laterality: N/A;  . MASTECTOMY Right 11/05/2017  . MASTECTOMY W/ SENTINEL  NODE BIOPSY Right 11/27/2017  . MASTECTOMY W/ SENTINEL NODE BIOPSY Right 11/27/2017   Procedure: RIGHT TOTAL MASTECTOMY WITH SENTINEL LYMPH NODE BIOPSY;  Surgeon: Excell Seltzer, MD;  Location: [facility];  Service: General;  Laterality: Right;  . PORT A CATH REVISION Left 05/07/2018   Procedure: PORT A CATH REVISION;  Surgeon: Excell Seltzer, MD;  Location: [facility];  Service: General;  Laterality: Left;  . PORTACATH PLACEMENT Left 11/27/2017   Procedure: INSERTION PORT-A-CATH;  Surgeon: Excell Seltzer, MD;  Location: [place];  Service: General;  Laterality: Left;  . RIGHT COLECTOMY  02/2007   for post polypectomy colonic perforation  . SPHINCTEROTOMY  10/03/2018   Procedure: SPHINCTEROTOMY;  Surgeon: Ladene Artist, MD;  Location: [facility];  Service: Endoscopy;;  . TUBAL LIGATION      Family History  Problem Relation Age of Onset  . Heart disease Mother   . Diabetes Mother   . Cancer Father        unsure what kind  . Diabetes Sister   . Colon cancer Neg Hx   . Breast cancer Neg Hx     Social History Reviewed with no changes to be made today.   Outpatient Medications Prior to Visit  Medication Sig Dispense Refill  . albuterol (PROVENTIL) (2.5 MG/3ML) 0.083% nebulizer solution Take 2.5 mg by nebulization every 6 (six) hours as needed for wheezing or shortness of breath.    . allopurinol (ZYLOPRIM) 300 MG tablet Take 300 mg by mouth every evening.     Marland Kitchen ALPRAZolam (XANAX) 0.25 MG tablet Take 0.25 mg by mouth at bedtime.     Marland Kitchen amLODipine (NORVASC) 5 MG tablet Take 1 tablet (5 mg total) by mouth daily. 90 tablet 0  . anastrozole (ARIMIDEX) 1 MG tablet Take 1 tablet (1 mg total) by mouth daily. 90 tablet 3  . aspirin 81 MG EC tablet Take 1 tablet (81 mg total) by mouth daily. 30 tablet 0  . budesonide-formoterol (SYMBICORT) 160-4.5 MCG/ACT inhaler Inhale 1 puff into the lungs 2 (two) times daily as needed (respiratory issues.).     Marland Kitchen LANTUS SOLOSTAR 100 UNIT/ML Solostar Pen  Inject 20 Units into the skin at bedtime. E11.65 15 mL 6  . loperamide (IMODIUM) 2 MG capsule Take 1 capsule (2 mg total) by mouth 4 (four) times daily as needed for diarrhea or loose stools. 12 capsule 0  . magnesium oxide (MAG-OX) 400 (241.3 Mg) MG tablet Take 1 tablet (400 mg total) by mouth 2 (two) times daily. 60 tablet 0  . metoprolol succinate (TOPROL-XL) 50 MG 24 hr tablet Take 1 tablet (50 mg total)  by mouth daily. Take with or immediately following a meal. 90 tablet 1  . Multiple Vitamin (MULTIVITAMIN WITH MINERALS) TABS tablet Take 1 tablet by mouth daily. [place]    . nitroGLYCERIN (NITROSTAT) 0.4 MG SL tablet Place 0.4 mg under the tongue every 5 (five) minutes as needed for chest pain.    . Omega-3 Fatty Acids (FISH OIL) 1000 MG CAPS Take 1,000 mg by mouth daily.    . ondansetron (ZOFRAN) 8 MG tablet Take 1 tablet (8 mg total) by mouth every 8 (eight) hours as needed for nausea or vomiting. 20 tablet 0  . pantoprazole (PROTONIX) 40 MG tablet Take 40 mg by mouth daily.    . traMADol (ULTRAM) 50 MG tablet Take 1 tablet (50 mg total) by mouth every 6 (six) hours as needed for moderate pain. 30 tablet 0  . Insulin Pen Needle (B-D UF III MINI PEN NEEDLES) 31G X 5 MM MISC Use as instructed. Inject into the skin once nightly.  E11.65 90 each 6  . atorvastatin (LIPITOR) 20 MG tablet Take 1 tablet (20 mg total) by mouth daily at 6 PM. 90 tablet 1  . losartan (COZAAR) 25 MG tablet Take 1 tablet (25 mg total) by mouth daily. 90 tablet 0   No facility-administered medications prior to visit.    No Known Allergies     Objective:    BP 117/78 (BP Location: Left Arm, Patient Position: Sitting, Cuff Size: Normal)   Pulse (!) 109   Ht 4\' 11"  (1.499 m)   Wt 138 lb (62.6 kg)   LMP  (LMP Unknown) Comment: tubal ligation  SpO2 98%   BMI 27.87 kg/m  Wt Readings from Last 3 Encounters:  12/14/19 138 lb (62.6 kg)  09/14/19 139 lb 3.2 oz (63.1 kg)  09/11/19 138 lb (62.6 kg)    Physical  Exam Vitals and nursing note reviewed.  Constitutional:      Appearance: She is well-developed.  HENT:     Head: Normocephalic and atraumatic.     Right Ear: Decreased hearing noted.     Left Ear: Decreased hearing noted.  Cardiovascular:     Rate and Rhythm: Normal rate and regular rhythm.     Heart sounds: Normal heart sounds. No murmur heard.  No friction rub. No gallop.   Pulmonary:     Effort: Pulmonary effort is normal. No tachypnea or respiratory distress.     Breath sounds: Normal breath sounds. No decreased breath sounds, wheezing, rhonchi or rales.  Chest:     Chest wall: No tenderness.  Abdominal:     General: Bowel sounds are normal.     Palpations: Abdomen is soft.  Musculoskeletal:        General: Normal range of motion.     Cervical back: Normal range of motion.  Skin:    General: Skin is warm and dry.  Neurological:     Mental Status: She is alert and oriented to person, place, and time.     Coordination: Coordination normal.  Psychiatric:        Mood and Affect: Mood normal.        Behavior: Behavior normal. Behavior is cooperative.        Thought Content: Thought content normal.        Cognition and Memory: Memory is impaired.          Patient has been counseled extensively about nutrition and exercise as well as the importance of adherence with medications and regular follow-up. The  patient was given clear instructions to go to ER or return to medical center if symptoms don't improve, worsen or new problems develop. The patient verbalized understanding.   Follow-up: Return in about 3 weeks (around 01/04/2020) for meter check with LUKE in 3 weeks. See me in 3 months.   Gildardo Pounds, FNP-BC [facility] and [facility] [place], [place]   12/18/2019, 8:05 AM

## 2019-12-18 ENCOUNTER — Encounter: Payer: Self-pay | Admitting: Nurse Practitioner

## 2019-12-18 MED ORDER — LOSARTAN POTASSIUM 25 MG PO TABS: 25.0000 mg | ORAL_TABLET | Freq: Every day | ORAL | 0 refills | Status: DC

## 2020-01-04 DIAGNOSIS — E785 Hyperlipidemia, unspecified: Secondary | ICD-10-CM | POA: Diagnosis not present

## 2020-01-04 DIAGNOSIS — E119 Type 2 diabetes mellitus without complications: Secondary | ICD-10-CM | POA: Diagnosis not present

## 2020-01-04 DIAGNOSIS — I10 Essential (primary) hypertension: Secondary | ICD-10-CM | POA: Diagnosis not present

## 2020-01-04 DIAGNOSIS — I25118 Atherosclerotic heart disease of native coronary artery with other forms of angina pectoris: Secondary | ICD-10-CM | POA: Diagnosis not present

## 2020-01-05 ENCOUNTER — Other Ambulatory Visit: Payer: Self-pay

## 2020-01-05 ENCOUNTER — Ambulatory Visit: Payer: Medicare Other | Attending: Nurse Practitioner | Admitting: Pharmacist

## 2020-01-05 ENCOUNTER — Encounter: Payer: Self-pay | Admitting: Pharmacist

## 2020-01-05 DIAGNOSIS — E119 Type 2 diabetes mellitus without complications: Secondary | ICD-10-CM | POA: Diagnosis not present

## 2020-01-05 DIAGNOSIS — Z794 Long term (current) use of insulin: Secondary | ICD-10-CM | POA: Diagnosis not present

## 2020-01-05 LAB — GLUCOSE, POCT (MANUAL RESULT ENTRY): POC Glucose: 222 mg/dl — AB (ref 70–99)

## 2020-01-05 MED ORDER — LANTUS SOLOSTAR 100 UNIT/ML ~~LOC~~ SOPN: 30.0000 [IU] | PEN_INJECTOR | Freq: Every day | SUBCUTANEOUS | 6 refills | Status: DC

## 2020-01-05 NOTE — Progress Notes (Addendum)
S:    Patient arrives in good spirits. Presents for diabetes evaluation, education, and management. Patient was referred and last seen by Primary Care Provider on 12/14/2019.  PMH includes MI, CAD, HLD, COPD, fatty liver disease  Family History: heart disease (mom), DM (father and sister) Social History: former smoker (quit 2012, 23 pack-years)  Insurance coverage/medication affordability: [employer]    Current diabetes medications include: Lantus 20 units daily in the morning (taking 10 extra units at night when sugars high), Victoza (did not start)  Patient is not taking medications as prescribed. She appears confused about her medication regimen and does not remember discussing Victoza with Zelda at her last visit. Upon calling patient's pharmacy, we found out that they tried to set up a delivery for Victoza several times with her and were unsuccessful.  Current hypertension medications include: amlodipine 5 mg, losartan 25 mg daily, metoprolol XL 50 mg daily Current hyperlipidemia medications include: atorvastatin 20 mg daily  Patient denies hypoglycemic events.  Patient reported dietary habits:  Diet is limited to what she can get from Meals on Wheels Drinks: drinks water, juice, and gatorade  Patient-reported exercise habits: walks around house when able, but exercise is limited by her walker   Patient denies nocturia (nighttime urination).  Patient reports neuropathy (nerve pain). Patient denies visual changes. Patient reports self foot exams.  Patient reports polyphagia and polydipsia.  O:  POCT glucose 222 (has not eaten this morning, but did have coffee with creamer)  Lab Results  Component Value Date   HGBA1C 10.9 (A) 12/14/2019   There were no vitals filed for this visit.  Lipid Panel     Component Value Date/Time   CHOL 125 09/11/2019 1009   TRIG 457 (H) 09/11/2019 1009   HDL 36 (L) 09/11/2019 1009   CHOLHDL 3.5 09/11/2019 1009   CHOLHDL 2.5  07/13/2018 0428   VLDL 20 07/13/2018 0428   LDLCALC 26 09/11/2019 1009    Home fasting blood sugars: 188 this morning; usually 300s-400s 2 hour post-meal/random blood sugars: 200s Patient is a poor historian and is very unsure of these readings. She did not bring her glucometer with her.  Clinical Atherosclerotic Cardiovascular Disease (ASCVD): Yes  The ASCVD Risk score Mikey Bussing DC Jr., et al., 2013) failed to calculate for the following reasons:   The patient has a prior MI or stroke diagnosis   A/P: Diabetes longstanding currently uncontrolled. Patient is able to verbalize appropriate hypoglycemia management plan. Medication adherence could be improved. Control is suboptimal due to medication nonadherence. -Change Lantus to 30 units daily -Start taking Victoza 0.6 mg daily. Her pharmacy will deliver it on Thursday. Will follow-up with patient in 1 week to increase dose to 1.2 mg daily. -Consider UACR at follow up visit. -Extensively discussed pathophysiology of diabetes, recommended lifestyle interventions, dietary effects on blood sugar control -Counseled on s/sx of and management of hypoglycemia -Next A1C anticipated 03/2020.   ASCVD risk - secondary prevention in patient with diabetes. Last LDL is controlled. High intensity statin indicated, but diabetes management was prioritized today and LDL is within goal. -Continue atorvastatin 20 mg daily. Consider increasing to 40 mg daily at a future visit to reach high intensity.  Written patient instructions provided.  Total time in face to face counseling 15 minutes. Follow up Pharmacist Phone Call in 1 week.  Beckey Rutter, PharmD Candidate [employer], [employer] of 2022  Benard Halsted, PharmD, [place]  [phone] ° ° °

## 2020-01-05 NOTE — Patient Instructions (Signed)
Thank you for coming to see me today. Please do the following:  1. Take Victoza in the morning. Take 0.6 mg every morning.  2. Take Lantus in the evening. Take 30 units before bedtime.  3. Continue checking blood sugars at home.  4. Continue making the lifestyle changes we've discussed together during our visit. Diet and exercise play a significant role in improving your blood sugars.  5. Follow-up with me over the phone in 1 week.    Hypoglycemia or low blood sugar:   Low blood sugar can happen quickly and may become an emergency if not treated right away.   While this shouldn't happen often, it can be brought upon if you skip a meal or do not eat enough. Also, if your insulin or other diabetes medications are dosed too high, this can cause your blood sugar to go to low.   Warning signs of low blood sugar include: 1. Feeling shaky or dizzy 2. Feeling weak or tired  3. Excessive hunger 4. Feeling anxious or upset  5. Sweating even when you aren't exercising  What to do if I experience low blood sugar? 1. Check your blood sugar with your meter. If lower than 70, proceed to step 2.  2. Treat with 3-4 glucose tablets or 3 packets of regular sugar. If these aren't around, you can try hard candy. Yet another option would be to drink 4 ounces of fruit juice or 6 ounces of REGULAR soda.  3. Re-check your sugar in 15 minutes. If it is still below 70, do what you did in step 2 again. If has come back up, go ahead and eat a snack or small meal at this time.

## 2020-01-08 ENCOUNTER — Other Ambulatory Visit: Payer: Self-pay

## 2020-01-08 ENCOUNTER — Ambulatory Visit
Admission: RE | Admit: 2020-01-08 | Discharge: 2020-01-08 | Disposition: A | Discharge status: Home / Self Care | Payer: Medicare Other | Source: Ambulatory Visit | Attending: Hematology and Oncology | Admitting: Hematology and Oncology

## 2020-01-08 ENCOUNTER — Other Ambulatory Visit: Payer: Self-pay | Admitting: Hematology and Oncology

## 2020-01-08 DIAGNOSIS — Z1231 Encounter for screening mammogram for malignant neoplasm of breast: Secondary | ICD-10-CM | POA: Diagnosis not present

## 2020-01-08 DIAGNOSIS — Z170 Estrogen receptor positive status [ER+]: Secondary | ICD-10-CM

## 2020-01-12 ENCOUNTER — Ambulatory Visit: Payer: Medicare Other | Attending: Nurse Practitioner | Admitting: Pharmacist

## 2020-01-12 ENCOUNTER — Encounter: Payer: Self-pay | Admitting: Pharmacist

## 2020-01-12 ENCOUNTER — Other Ambulatory Visit: Payer: Self-pay

## 2020-01-12 DIAGNOSIS — Z794 Long term (current) use of insulin: Secondary | ICD-10-CM

## 2020-01-12 DIAGNOSIS — E119 Type 2 diabetes mellitus without complications: Secondary | ICD-10-CM

## 2020-01-12 MED ORDER — LANTUS SOLOSTAR 100 UNIT/ML ~~LOC~~ SOPN: 10.0000 [IU] | PEN_INJECTOR | Freq: Every day | SUBCUTANEOUS | 6 refills | Status: DC

## 2020-01-12 NOTE — Progress Notes (Signed)
Virtual Visit via Telephone Note  I connected with [name], on 01/12/2020 at 1:30PM by telephonedue to the COVID-19 pandemic and verified that I am speaking with the correct person using two identifiers.  Consent: I discussed the limitations, risks, security and privacy concerns of performing an evaluation and management service by telephone and the availability of in person appointments. I also discussed with the patient that there may be a patient responsible charge related to this service. The patient expressed understanding and agreed to proceed.   Location of Patient: Home  Location of Provider: Clinic   Persons participating in Telemedicine visit: [name] Myself       S:    Patient arrives in good spirits. Presents for diabetes evaluation, education, and management. Patient was referred and last seen by Primary Care Provider on 12/14/2019. Pharmacy last saw her on 01/05/2020 and helped her coordinate Victoza delivery from her pharmacy.   Today, she reports that she has received and started her Victoza. She has decreased her Lantus to 10 units daily d/t lower blood sugars.   Family History: heart disease (mom), DM (father and sister) Social History: former smoker (quit 2012, 23 pack-years)  Insurance coverage/medication affordability: [employer]    Current diabetes medications include: Lantus 30 units daily in the evening (only injecting 10 units daily), Victoza 0.6 mg daily Current hypertension medications include: amlodipine 5 mg, losartan 25 mg daily, metoprolol XL 50 mg daily Current hyperlipidemia medications include: atorvastatin 20 mg daily  Patient denies hypoglycemic events.  Patient reported dietary habits:  Diet is limited to what she can get from Meals on Wheels Drinks: drinks water, juice, and gatorade  Patient-reported exercise habits: walks around house when able, but exercise is limited by her walker   Patient denies  nocturia (nighttime urination).  Patient reports neuropathy (nerve pain). Patient denies visual changes. Patient reports self foot exams.  Patient reports polyphagia and polydipsia.  O:  Took this AM: 144  Lab Results  Component Value Date   HGBA1C 10.9 (A) 12/14/2019   There were no vitals filed for this visit.  Lipid Panel     Component Value Date/Time   CHOL 125 09/11/2019 1009   TRIG 457 (H) 09/11/2019 1009   HDL 36 (L) 09/11/2019 1009   CHOLHDL 3.5 09/11/2019 1009   CHOLHDL 2.5 07/13/2018 0428   VLDL 20 07/13/2018 0428   LDLCALC 26 09/11/2019 1009    Home fasting blood sugars: 108 - 144 2 hour post-meal/random blood sugars: 210, 248  Clinical Atherosclerotic Cardiovascular Disease (ASCVD): Yes  The ASCVD Risk score Mikey Bussing DC Jr., et al., 2013) failed to calculate for the following reasons:   The patient has a prior MI or stroke diagnosis   A/P: Diabetes longstanding currently uncontrolled, but home CBGs are improving since starting Victoza. Patient is able to verbalize appropriate hypoglycemia management plan. Medication adherence reported. Control is suboptimal due to medication nonadherence. -Change Lantus to 10 units daily.  -Increase Victoza to 1.2 mg daily this Thursday. Instructions given to reduce Lantus to 5 units at that time.  -Consider UACR at follow up visit. -Extensively discussed pathophysiology of diabetes, recommended lifestyle interventions, dietary effects on blood sugar control -Counseled on s/sx of and management of hypoglycemia -Next A1C anticipated 03/2020.   ASCVD risk - secondary prevention in patient with diabetes. Last LDL is controlled. High intensity statin indicated, but diabetes management was prioritized today and LDL is within goal. -Continue atorvastatin 20 mg daily. Consider increasing to 40  mg daily at a future visit to reach high intensity.  Written patient instructions provided.  Total time in face to face counseling 15 minutes.  Follow up Pharmacist Phone Call in 1 week.  Benard Halsted, PharmD, [place], [place] [phone]

## 2020-01-13 ENCOUNTER — Ambulatory Visit: Payer: Self-pay

## 2020-01-13 ENCOUNTER — Telehealth: Payer: Self-pay | Admitting: Nurse Practitioner

## 2020-01-13 NOTE — Telephone Encounter (Signed)
Spoke with patient. She had an episode of vomiting yesterday. She started Victoza on 01/07/2020 and denies issues with it up to yesterday's episode. Additionally, she has taken it today and denies any further nausea, vomiting, or abdominal pain. Denies hypoglycemia. She has eaten today. I recommend to continue Victoza for now. Pt has been instructed to call us with any further episodes of NV.

## 2020-01-13 NOTE — Telephone Encounter (Signed)
Pt has telephone visit with luke yesterday 01-12-2020 and after she got off the phone with luke she throw up and had stomach pain. Pt was started on new medication. Please advise

## 2020-01-13 NOTE — Telephone Encounter (Signed)
Pt. Reports she took Victoza yesterday and vomiting x1. States "I talked to someone yesterday, but they haven't called me back today." Will forward to the practice.  Answer Assessment - Initial Assessment Questions 1. NAME of MEDICATION: "What medicine are you calling about?"     New medicine - Victoza 2. QUESTION: "What is your question?" (e.g., medication refill, side effect)     Side effect 3. PRESCRIBING HCP: "Who prescribed it?" Reason: if prescribed by specialist, call should be referred to that group.     Luke 4. SYMPTOMS: "Do you have any symptoms?"     Vomited x 1 5. SEVERITY: If symptoms are present, ask "Are they mild, moderate or severe?"     Moderate 6. PREGNANCY:  "Is there any chance that you are pregnant?" "When was your last menstrual period?"     No  Protocols used: MEDICATION QUESTION CALL-A-AH

## 2020-01-19 ENCOUNTER — Other Ambulatory Visit: Payer: Self-pay | Admitting: Hematology and Oncology

## 2020-01-21 ENCOUNTER — Encounter: Payer: Self-pay | Admitting: Pharmacist

## 2020-01-21 ENCOUNTER — Other Ambulatory Visit: Payer: Self-pay

## 2020-01-21 ENCOUNTER — Ambulatory Visit: Payer: Medicare Other | Attending: Nurse Practitioner | Admitting: Pharmacist

## 2020-01-21 DIAGNOSIS — Z794 Long term (current) use of insulin: Secondary | ICD-10-CM

## 2020-01-21 DIAGNOSIS — E119 Type 2 diabetes mellitus without complications: Secondary | ICD-10-CM

## 2020-01-21 MED ORDER — VICTOZA 18 MG/3ML ~~LOC~~ SOPN: 1.8000 mg | PEN_INJECTOR | Freq: Every day | SUBCUTANEOUS | 2 refills | Status: DC

## 2020-01-21 NOTE — Progress Notes (Signed)
Virtual Visit via Telephone Note  I connected with [name], on 01/21/2020 at 300PM by telephonedue to the COVID-19 pandemic and verified that I am speaking with the correct person using two identifiers.  Consent: I discussed the limitations, risks, security and privacy concerns of performing an evaluation and management service by telephone and the availability of in person appointments. I also discussed with the patient that there may be a patient responsible charge related to this service. The patient expressed understanding and agreed to proceed.   Location of Patient: Home  Location of Provider: Clinic   Persons participating in Telemedicine visit: [name] Myself       S:    Patient arrives in good spirits. Presents for diabetes evaluation, education, and management. Patient was referred and last seen by Primary Care Provider on 12/14/2019. Pharmacy last saw her on 01/12/2020 and helped her titrate her Victoza.  Today, pt reports that she has continued her Victoza. Denies NV, abdominal pain.  She has decreased her Lantus to 5 units daily.    Family History: heart disease (mom), DM (father and sister) Social History: former smoker (quit 2012, 23 pack-years)  Insurance coverage/medication affordability: [employer]    Current diabetes medications include: Lantus 5 units daily, Victoza 1.2 mg daily Current hypertension medications include: amlodipine 5 mg, losartan 25 mg daily, metoprolol XL 50 mg daily Current hyperlipidemia medications include: atorvastatin 20 mg daily  Patient denies hypoglycemic events.  Patient reported dietary habits:  Diet is limited to what she can get from [employer] Whe[employer] Drinks: drinks water, juice, and gatorade  Patient-reported exercise habits: walks around house when able, but exercise is limited by her walker   Patient denies nocturia (nighttime urination).  Patient reports neuropathy (nerve pain). Patient  denies visual changes. Patient reports self foot exams.  Patient reports polyphagia and polydipsia.  O:  Took this AM: 135  Lab Results  Component Value Date   HGBA1C 10.9 (A) 12/14/2019   There were no vitals filed for this visit.  Lipid Panel     Component Value Date/Time   CHOL 125 09/11/2019 1009   TRIG 457 (H) 09/11/2019 1009   HDL 36 (L) 09/11/2019 1009   CHOLHDL 3.5 09/11/2019 1009   CHOLHDL 2.5 07/13/2018 0428   VLDL 20 07/13/2018 0428   LDLCALC 26 09/11/2019 1009    Home fasting blood sugars: 135 - 196 2 hour post-meal/random blood sugars: 98 - 192  Clinical Atherosclerotic Cardiovascular Disease (ASCVD): Yes  The ASCVD Risk score Mikey Bussing DC Jr., et al., 2013) failed to calculate for the following reasons:   The patient has a prior MI or stroke diagnosis   A/P: Diabetes longstanding currently uncontrolled, but home CBGs are improving since starting Victoza. Patient is able to verbalize appropriate hypoglycemia management plan. Medication adherence reported. Control is suboptimal due to medication nonadherence. -Continue Lantus 5 units daily.  -Increase Victoza to 1.8 mg daily. -Consider UACR at follow up visit. -Extensively discussed pathophysiology of diabetes, recommended lifestyle interventions, dietary effects on blood sugar control -Counseled on s/sx of and management of hypoglycemia -Next A1C anticipated 03/2020.   ASCVD risk - secondary prevention in patient with diabetes. Last LDL is controlled. High intensity statin indicated, but diabetes management was prioritized today and LDL is within goal. -Continue atorvastatin 20 mg daily.   Written patient instructions provided.  Total time in face to face counseling 15 minutes. Follow up Pharmacist visit in 1 month.  Benard Halsted, PharmD, BCACP, CPP  [place] [phone]

## 2020-02-22 ENCOUNTER — Ambulatory Visit: Payer: Medicare Other | Admitting: Pharmacist

## 2020-03-11 ENCOUNTER — Telehealth: Payer: Self-pay | Admitting: Nurse Practitioner

## 2020-03-11 MED ORDER — TRUEPLUS PEN NEEDLES 31G X 8 MM MISC: 2 refills | Status: DC

## 2020-03-11 NOTE — Telephone Encounter (Signed)
Rx sent 

## 2020-03-11 NOTE — Telephone Encounter (Signed)
Pt called and is requesting to have long Pen needles. Pt states that she is not able to use the short ones. Please advise.       [place], [place] [address] [address]  [phone] [place] [address]  Phone: [phone] Fax: [phone]  Hours: Not open 24 hours

## 2020-03-15 ENCOUNTER — Ambulatory Visit: Payer: 59 | Attending: Nurse Practitioner | Admitting: Nurse Practitioner

## 2020-03-15 ENCOUNTER — Encounter: Payer: Self-pay | Admitting: Nurse Practitioner

## 2020-03-15 ENCOUNTER — Other Ambulatory Visit: Payer: Self-pay

## 2020-03-15 ENCOUNTER — Telehealth: Payer: Self-pay | Admitting: Nurse Practitioner

## 2020-03-15 VITALS — BP 122/81 | HR 97 | Temp 98.8°F | Ht 59.0 in | Wt 129.0 lb

## 2020-03-15 DIAGNOSIS — I10 Essential (primary) hypertension: Secondary | ICD-10-CM | POA: Diagnosis not present

## 2020-03-15 DIAGNOSIS — E785 Hyperlipidemia, unspecified: Secondary | ICD-10-CM | POA: Diagnosis not present

## 2020-03-15 DIAGNOSIS — Z1211 Encounter for screening for malignant neoplasm of colon: Secondary | ICD-10-CM

## 2020-03-15 DIAGNOSIS — N1832 Chronic kidney disease, stage 3b: Secondary | ICD-10-CM | POA: Diagnosis not present

## 2020-03-15 DIAGNOSIS — E119 Type 2 diabetes mellitus without complications: Secondary | ICD-10-CM

## 2020-03-15 DIAGNOSIS — Z794 Long term (current) use of insulin: Secondary | ICD-10-CM | POA: Diagnosis not present

## 2020-03-15 DIAGNOSIS — E1122 Type 2 diabetes mellitus with diabetic chronic kidney disease: Secondary | ICD-10-CM | POA: Diagnosis not present

## 2020-03-15 DIAGNOSIS — K219 Gastro-esophageal reflux disease without esophagitis: Secondary | ICD-10-CM

## 2020-03-15 DIAGNOSIS — J418 Mixed simple and mucopurulent chronic bronchitis: Secondary | ICD-10-CM

## 2020-03-15 DIAGNOSIS — D508 Other iron deficiency anemias: Secondary | ICD-10-CM

## 2020-03-15 DIAGNOSIS — N186 End stage renal disease: Secondary | ICD-10-CM | POA: Insufficient documentation

## 2020-03-15 DIAGNOSIS — I200 Unstable angina: Secondary | ICD-10-CM

## 2020-03-15 LAB — POCT GLYCOSYLATED HEMOGLOBIN (HGB A1C): Hemoglobin A1C: 6.4 % — AB (ref 4.0–5.6)

## 2020-03-15 LAB — GLUCOSE, POCT (MANUAL RESULT ENTRY): POC Glucose: 112 mg/dl — AB (ref 70–99)

## 2020-03-15 MED ORDER — NITROGLYCERIN 0.4 MG SL SUBL: 0.4000 mg | SUBLINGUAL_TABLET | SUBLINGUAL | 0 refills | Status: AC | PRN

## 2020-03-15 MED ORDER — AMLODIPINE BESYLATE 5 MG PO TABS: 5.0000 mg | ORAL_TABLET | Freq: Every day | ORAL | 0 refills | Status: DC

## 2020-03-15 MED ORDER — LOSARTAN POTASSIUM 25 MG PO TABS: 25.0000 mg | ORAL_TABLET | Freq: Every day | ORAL | 0 refills | Status: DC

## 2020-03-15 MED ORDER — ALLOPURINOL 100 MG PO TABS: 100.0000 mg | ORAL_TABLET | Freq: Every evening | ORAL | 0 refills | Status: DC

## 2020-03-15 MED ORDER — LANTUS SOLOSTAR 100 UNIT/ML ~~LOC~~ SOPN: 5.0000 [IU] | PEN_INJECTOR | Freq: Every day | SUBCUTANEOUS | 1 refills | Status: DC

## 2020-03-15 MED ORDER — METOPROLOL SUCCINATE ER 50 MG PO TB24: 50.0000 mg | ORAL_TABLET | Freq: Every day | ORAL | 1 refills | Status: DC

## 2020-03-15 MED ORDER — VICTOZA 18 MG/3ML ~~LOC~~ SOPN: 1.8000 mg | PEN_INJECTOR | Freq: Every day | SUBCUTANEOUS | 2 refills | Status: DC

## 2020-03-15 MED ORDER — BUDESONIDE-FORMOTEROL FUMARATE 160-4.5 MCG/ACT IN AERO: 1.0000 | INHALATION_SPRAY | Freq: Two times a day (BID) | RESPIRATORY_TRACT | 6 refills | Status: DC | PRN

## 2020-03-15 MED ORDER — PANTOPRAZOLE SODIUM 40 MG PO TBEC: 40.0000 mg | DELAYED_RELEASE_TABLET | Freq: Every day | ORAL | 0 refills | Status: DC

## 2020-03-15 MED ORDER — ATORVASTATIN CALCIUM 20 MG PO TABS: 20.0000 mg | ORAL_TABLET | Freq: Every day | ORAL | 1 refills | Status: DC

## 2020-03-15 NOTE — Telephone Encounter (Signed)
Error - taken care of already

## 2020-03-15 NOTE — Progress Notes (Signed)
Assessment & Plan:  [name] was seen today for follow-up.  Diagnoses and all orders for this visit:  Type 2 diabetes mellitus with stage 3b chronic kidney disease, with long-term current use of insulin (HCC) -     Glucose (CBG) -     HgB A1c -     LANTUS SOLOSTAR 100 UNIT/ML Solostar Pen; Inject 5 Units into the skin at bedtime. E11.65 -     liraglutide (VICTOZA) 18 MG/3ML SOPN; Inject 1.8 mg into the skin daily. -     Ambulatory referral to Ophthalmology Continue blood sugar control as discussed in office today, low carbohydrate diet, and regular physical exercise as tolerated, 150 minutes per week (30 min each day, 5 days per week, or 50 min 3 days per week). Keep blood sugar logs with fasting goal of 90-130 mg/dl, post prandial (after you eat) less than 180.  For Hypoglycemia: BS <60 and Hyperglycemia BS >400; contact the clinic ASAP. Annual eye exams and foot exams are recommended.  Essential hypertension -     losartan (COZAAR) 25 MG tablet; Take 1 tablet (25 mg total) by mouth daily. -     metoprolol succinate (TOPROL-XL) 50 MG 24 hr tablet; Take 1 tablet (50 mg total) by mouth daily. Take with or immediately following a meal. -     amLODipine (NORVASC) 5 MG tablet; Take 1 tablet (5 mg total) by mouth daily. -     CMP14+EGFR Continue all antihypertensives as prescribed.  Remember to bring in your blood pressure log with you for your follow up appointment.  DASH/Mediterranean Diets are healthier choices for HTN.    Dyslipidemia, goal LDL below 70 -     atorvastatin (LIPITOR) 20 MG tablet; Take 1 tablet (20 mg total) by mouth daily at 6 PM. -     Lipid panel INSTRUCTIONS: Work on a low fat, heart healthy diet and participate in regular aerobic exercise program by working out at least 150 minutes per week; 5 days a week-30 minutes per day. Avoid red meat/beef/steak,  fried foods. junk foods, sodas, sugary drinks, unhealthy snacking, alcohol and smoking.  Drink at least 80 oz of  water per day and monitor your carbohydrate intake daily.    Colon cancer screening -     Fecal occult blood, imunochemical([facility])  Other iron deficiency anemia -     CBC  Unstable angina (HCC) -     nitroGLYCERIN (NITROSTAT) 0.4 MG SL tablet; Place 1 tablet (0.4 mg total) under the tongue every 5 (five) minutes as needed for chest pain.  Mixed simple and mucopurulent chronic bronchitis (HCC) -     budesonide-formoterol (SYMBICORT) 160-4.5 MCG/ACT inhaler; Inhale 1 puff into the lungs 2 (two) times daily as needed (respiratory issues.).  Gastroesophageal reflux disease without esophagitis -     pantoprazole (PROTONIX) 40 MG tablet; Take 1 tablet (40 mg total) by mouth daily. INSTRUCTIONS: Avoid GERD Triggers: acidic, spicy or fried foods, caffeine, coffee, sodas,  alcohol and chocolate.     Patient has been counseled on age-appropriate routine health concerns for screening and prevention. These are reviewed and up-to-date. Referrals have been placed accordingly. Immunizations are up-to-date or declined.    Subjective:   Chief Complaint  Patient presents with  . Follow-up    Patient is here for 3 months follow up.   HPI [name] 70 y.o. female presents to office today for follow up. 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 Diabetes is well controlled. She is taking 5 units of lantus instead of 10 units and victoza 1.8 mg daily. On ARB and statin. LDL at goal with atorvastatin 20 mg daily.  Lab Results  Component Value Date   HGBA1C 6.4 (A) 03/15/2020   Lab Results  Component Value Date   HGBA1C 10.9 (A) 12/14/2019    Lab Results  Component Value Date   LDLCALC 26 09/11/2019   Essential Hypertension Blood pressure readings per log average: 110-120/70-80s. Denies chest pain, shortness of breath, palpitations, lightheadedness, dizziness, headaches or BLE edema. Taking losartan 25 mg daily, toprol xl 50 mg daily and amlodipine 5 mg daily.  BP Readings from Last 3 Encounters:  03/15/20 122/81  12/14/19 117/78  09/14/19 124/76    Review of Systems  Constitutional: Negative for fever, malaise/fatigue and weight loss.  HENT: Negative.  Negative for nosebleeds.   Eyes: Negative.  Negative for blurred vision, double vision and photophobia.  Respiratory: Negative.  Negative for cough, sputum production, shortness of breath and wheezing.   Cardiovascular: Negative.  Negative for chest pain, palpitations and leg swelling.  Gastrointestinal: Positive for heartburn (well controlled with PPI). Negative for nausea and vomiting.  Musculoskeletal: Positive for joint pain. Negative for myalgias.  Neurological: Negative.  Negative for dizziness, focal weakness, seizures and headaches.  Psychiatric/Behavioral: Negative.  Negative for suicidal ideas.    Past Medical History:  Diagnosis Date  . Ambulates with cane    straight  . Angina    no current problems per patient at PAT appt 11/05/18  . Arthritis    HANDS"  . Asthma   . Cancer (HCC) 09/04/2017   Right Breast  . Carpal tunnel syndrome, bilateral 11/26/2016  . Cholelithiasis   . Cocaine abuse (HCC) 07/2012   per E.R. drug screen  . COPD (chronic obstructive pulmonary disease) (HCC)    per patient  . Coronary artery disease   . Diabetes mellitus without mention of complication    type 2  . Dysphagia    esophageal dysmotility on 03/2011 esophagram   . Dyspnea    with exertion  . Fatty liver disease, nonalcoholic 2009   on Imaging.   Marland Kitchen GERD (gastroesophageal reflux disease)   . Headache   . Hearing loss    bilateral - no hearing aids  . History  of colonic polyps 2009   adenomatous 2009, HP in 2009, 2011, 2013.   Marland Kitchen Hyperlipidemia   . Hypertension   . Iron deficiency anemia   . Myocardial infarction (HCC)    years ago, 6 stents placed  . 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 Wears dentures    full upper and partial lower  . Wears glasses     Past Surgical History:  Procedure Laterality Date  . ABDOMINAL ANGIOGRAM  02/20/2011   Procedure: ABDOMINAL ANGIOGRAM;  Surgeon: Robynn Pane, MD;  Location: [facility] CATH LAB;  Service: Cardiovascular;;  . BREAST BIOPSY Left   . CARPAL TUNNEL RELEASE Right 07/04/2017   Procedure: RIGHT CARPAL TUNNEL RELEASE;  Surgeon: Betha Loa, MD;  Location: [facility];  Service: Orthopedics;  Laterality: Right;  . CARPAL TUNNEL RELEASE Left 09/12/2017   Procedure: LEFT CARPAL TUNNEL RELEASE;  Surgeon:  Leanora Cover, MD;  Location: [facility];  Service: Orthopedics;  Laterality: Left;  . CHOLECYSTECTOMY N/A 11/11/2018   Procedure: ATTEMPTED LAPAROSCOPIC CHOLECUSTECOMY,  OPEN CHOLECYSTECTOMY;  Surgeon: Coralie Keens, MD;  Location: [facility];  Service: General;  Laterality: N/A;  . COLONOSCOPY  11/30/2011   Procedure: COLONOSCOPY;  Surgeon: Lafayette Dragon, MD;  Location: [facility];  Service: Endoscopy;  Laterality: N/A;  . CORONARY ANGIOPLASTY WITH STENT PLACEMENT  02/20/2011  . ERCP N/A 10/03/2018   Procedure: ENDOSCOPIC RETROGRADE CHOLANGIOPANCREATOGRAPHY (ERCP);  Surgeon: Ladene Artist, MD;  Location: [facility];  Service: Endoscopy;  Laterality: N/A;  . ESOPHAGOGASTRODUODENOSCOPY  11/30/2011   Procedure: ESOPHAGOGASTRODUODENOSCOPY (EGD);  Surgeon: Lafayette Dragon, MD;  Location: [facility];  Service: Endoscopy;  Laterality: N/A;  . ESOPHAGOGASTRODUODENOSCOPY N/A 02/23/2015   Procedure: ESOPHAGOGASTRODUODENOSCOPY (EGD);  Surgeon: Gatha Mayer, MD;  Location[facility];  Service: Endoscopy;  Laterality: N/A;  . EYE SURGERY Bilateral    cataracts removed  .  INTRAOPERATIVE CHOLANGIOGRAM N/A 11/11/2018   Procedure: Intraoperative Cholangiogram;  Surgeon: Coralie Keens, MD;  Location: [place];  Service: General;  Laterality: N/A;  . LEFT HEART CATHETERIZATION WITH CORONARY ANGIOGRAM N/A 02/20/2011   Procedure: LEFT HEART CATHETERIZATION WITH CORONARY ANGIOGRAM;  Surgeon: Clent Demark, MD;  Location: [facility] CATH LAB;  Service: Cardiovascular;  Laterality: N/A;  . MASTECTOMY Right 11/05/2017  . MASTECTOMY W/ SENTINEL NODE BIOPSY Right 11/27/2017  . MASTECTOMY W/ SENTINEL NODE BIOPSY Right 11/27/2017   Procedure: RIGHT TOTAL MASTECTOMY WITH SENTINEL LYMPH NODE BIOPSY;  Surgeon: Excell Seltzer, MD;  Location: [place];  Service: General;  Laterality: Right;  . PORT A CATH REVISION Left 05/07/2018   Procedure: PORT A CATH REVISION;  Surgeon: Excell Seltzer, MD;  Location: [facility];  Service: General;  Laterality: Left;  . PORTACATH PLACEMENT Left 11/27/2017   Procedure: INSERTION PORT-A-CATH;  Surgeon: Excell Seltzer, MD;  Location: [place];  Service: General;  Laterality: Left;  . RIGHT COLECTOMY  02/2007   for post polypectomy colonic perforation  . SPHINCTEROTOMY  10/03/2018   Procedure: SPHINCTEROTOMY;  Surgeon: Ladene Artist, MD;  Location: [facility];  Service: Endoscopy;;  . TUBAL LIGATION      Family History  Problem Relation Age of Onset  . Heart disease Mother   . Diabetes Mother   . Cancer Father        unsure what kind  . Diabetes Sister   . Colon cancer Neg Hx   . Breast cancer Neg Hx     Social History Reviewed with no changes to be made today.   Outpatient Medications Prior to Visit  Medication Sig Dispense Refill  . albuterol (PROVENTIL) (2.5 MG/3ML) 0.083% nebulizer solution Take 2.5 mg by nebulization every 6 (six) hours as needed for wheezing or shortness of breath.    . ALPRAZolam (XANAX) 0.25 MG tablet Take 0.25 mg by mouth at bedtime.    Marland Kitchen anastrozole (ARIMIDEX) 1 MG tablet TAKE ONE TABLET BY MOUTH DAILY 90 tablet  0  . aspirin 81 MG EC tablet Take 1 tablet (81 mg total) by mouth daily. 30 tablet 0  . Insulin Pen Needle (TRUEPLUS PEN NEEDLES) 31G X 8 MM MISC Inject into the skin once nightly.  E11.65 100 each 2  . Misc. Devices MISC Please provide patient with nebulizer mask and tubing. ICD-10J44.9 1 each 0  . Multiple Vitamin (MULTIVITAMIN WITH MINERALS) TABS tablet Take 1 tablet by mouth daily. [place]    . Omega-3 Fatty  Acids (FISH OIL) 1000 MG CAPS Take 1,000 mg by mouth daily.    . traMADol (ULTRAM) 50 MG tablet Take 1 tablet (50 mg total) by mouth every 6 (six) hours as needed for moderate pain. 30 tablet 0  . allopurinol (ZYLOPRIM) 300 MG tablet Take 300 mg by mouth every evening.     Marland Kitchen amLODipine (NORVASC) 5 MG tablet Take 1 tablet (5 mg total) by mouth daily. 90 tablet 0  . budesonide-formoterol (SYMBICORT) 160-4.5 MCG/ACT inhaler Inhale 1 puff into the lungs 2 (two) times daily as needed (respiratory issues.).     Marland Kitchen LANTUS SOLOSTAR 100 UNIT/ML Solostar Pen Inject 10 Units into the skin at bedtime. E11.65 (Patient taking differently: Inject 5 Units into the skin at bedtime. E11.65) 15 mL 6  . liraglutide (VICTOZA) 18 MG/3ML SOPN Inject 1.8 mg into the skin daily. 9 mL 2  . loperamide (IMODIUM) 2 MG capsule Take 1 capsule (2 mg total) by mouth 4 (four) times daily as needed for diarrhea or loose stools. 12 capsule 0  . losartan (COZAAR) 25 MG tablet Take 1 tablet (25 mg total) by mouth daily. 90 tablet 0  . magnesium oxide (MAG-OX) 400 (241.3 Mg) MG tablet Take 1 tablet (400 mg total) by mouth 2 (two) times daily. 60 tablet 0  . metoprolol succinate (TOPROL-XL) 50 MG 24 hr tablet Take 1 tablet (50 mg total) by mouth daily. Take with or immediately following a meal. 90 tablet 1  . nitroGLYCERIN (NITROSTAT) 0.4 MG SL tablet Place 0.4 mg under the tongue every 5 (five) minutes as needed for chest pain.    Marland Kitchen ondansetron (ZOFRAN) 8 MG tablet Take 1 tablet (8 mg total) by mouth every 8 (eight) hours as needed  for nausea or vomiting. 20 tablet 0  . pantoprazole (PROTONIX) 40 MG tablet Take 40 mg by mouth daily.    Marland Kitchen atorvastatin (LIPITOR) 20 MG tablet Take 1 tablet (20 mg total) by mouth daily at 6 PM. 90 tablet 1   No facility-administered medications prior to visit.    No Known Allergies     Objective:    BP 122/81 (BP Location: Left Arm, Patient Position: Sitting, Cuff Size: Normal)   Pulse 97   Temp 98.8 F (37.1 C) (Oral)   Ht 4' 11"  (1.499 m)   Wt 129 lb (58.5 kg)   LMP  (LMP Unknown) Comment: tubal ligation  SpO2 99%   BMI 26.05 kg/m  Wt Readings from Last 3 Encounters:  03/15/20 129 lb (58.5 kg)  12/14/19 138 lb (62.6 kg)  09/14/19 139 lb 3.2 oz (63.1 kg)    Physical Exam Vitals and nursing note reviewed.  Constitutional:      Appearance: She is well-developed and well-nourished.  HENT:     Head: Normocephalic and atraumatic.  Eyes:     Extraocular Movements: EOM normal.  Cardiovascular:     Rate and Rhythm: Normal rate and regular rhythm.     Pulses: Intact distal pulses.     Heart sounds: Normal heart sounds. No murmur heard. No friction rub. No gallop.   Pulmonary:     Effort: Pulmonary effort is normal. No tachypnea or respiratory distress.     Breath sounds: Normal breath sounds. No decreased breath sounds, wheezing, rhonchi or rales.  Chest:     Chest wall: No tenderness.  Abdominal:     General: Bowel sounds are normal.     Palpations: Abdomen is soft.  Musculoskeletal:  General: No edema. Normal range of motion.     Cervical back: Normal range of motion.  Skin:    General: Skin is warm and dry.  Neurological:     Mental Status: She is alert and oriented to person, place, and time.     Coordination: Coordination normal.  Psychiatric:        Mood and Affect: Mood and affect normal.        Behavior: Behavior normal. Behavior is cooperative.        Thought Content: Thought content normal.        Judgment: Judgment normal.           Patient has been counseled extensively about nutrition and exercise as well as the importance of adherence with medications and regular follow-up. The patient was given clear instructions to go to ER or return to medical center if symptoms don't improve, worsen or new problems develop. The patient verbalized understanding.   Follow-up: Return in about 3 months (around 06/12/2020).   Gildardo Pounds, FNP-BC [facility], [place]   03/15/2020, 12:00 PM

## 2020-03-15 NOTE — Patient Instructions (Signed)
Marcha Dutton, MD Oncologist in [place], [place] Address: [address], [place], [place] [address] Phone: [phone]

## 2020-03-16 LAB — CBC
Hematocrit: 41.9 % (ref 34.0–46.6)
Hemoglobin: 14 g/dL (ref 11.1–15.9)
MCH: 30.8 pg (ref 26.6–33.0)
MCHC: 33.4 g/dL (ref 31.5–35.7)
MCV: 92 fL (ref 79–97)
Platelets: 283 10*3/uL (ref 150–450)
RBC: 4.55 x10E6/uL (ref 3.77–5.28)
RDW: 13.3 % (ref 11.7–15.4)
WBC: 5.4 10*3/uL (ref 3.4–10.8)

## 2020-03-16 LAB — CMP14+EGFR
ALT: 13 IU/L (ref 0–32)
AST: 12 IU/L (ref 0–40)
Albumin/Globulin Ratio: 1.2 (ref 1.2–2.2)
Albumin: 4.5 g/dL (ref 3.7–4.7)
Alkaline Phosphatase: 111 IU/L (ref 44–121)
BUN/Creatinine Ratio: 10 — ABNORMAL LOW (ref 12–28)
BUN: 16 mg/dL (ref 8–27)
Bilirubin Total: 0.5 mg/dL (ref 0.0–1.2)
CO2: 21 mmol/L (ref 20–29)
Calcium: 10.7 mg/dL — ABNORMAL HIGH (ref 8.7–10.3)
Chloride: 103 mmol/L (ref 96–106)
Creatinine, Ser: 1.65 mg/dL — ABNORMAL HIGH (ref 0.57–1.00)
GFR calc Af Amer: 36 mL/min/{1.73_m2} — ABNORMAL LOW (ref >59)
GFR calc non Af Amer: 31 mL/min/{1.73_m2} — ABNORMAL LOW (ref >59)
Globulin, Total: 3.8 g/dL (ref 1.5–4.5)
Glucose: 92 mg/dL (ref 65–99)
Potassium: 4.6 mmol/L (ref 3.5–5.2)
Sodium: 142 mmol/L (ref 134–144)
Total Protein: 8.3 g/dL (ref 6.0–8.5)

## 2020-03-16 LAB — LIPID PANEL
Chol/HDL Ratio: 2.7 ratio (ref 0.0–4.4)
Cholesterol, Total: 116 mg/dL (ref 100–199)
HDL: 43 mg/dL (ref >39)
LDL Chol Calc (NIH): 51 mg/dL (ref 0–99)
Triglycerides: 124 mg/dL (ref 0–149)
VLDL Cholesterol Cal: 22 mg/dL (ref 5–40)

## 2020-03-17 ENCOUNTER — Telehealth: Payer: Self-pay | Admitting: Nurse Practitioner

## 2020-03-17 NOTE — Telephone Encounter (Signed)
Copied from [place] [phone]. Topic: Quick Communication - Rx Refill/Question >> Mar 17, 2020 12:39 PM Erick Blinks wrote: [phone] pt is requesting a call back from office regarding the needles that she discussed during her visit on Tuesday. She says that the needles that were prescribed to her were not the right size and she needs a call back to resolve this.  She says she is missing the tips, and she says that she was told that this was getting taken care of yesterday. She has contacted the pharmacy today and they do not have what she needs.

## 2020-03-19 IMAGING — US US ABDOMEN LIMITED
1 series · 14 of 25 positions shown · non-contrast
Comparison: CT abdomen and pelvis 12/18/2017

CLINICAL DATA: RIGHT upper quadrant abdominal pain

EXAM:
ULTRASOUND ABDOMEN LIMITED RIGHT UPPER QUADRANT

[Series 1: us abdomen limited · 0.20mm/px · 14 of 51 slices shown]
[im 1/51]
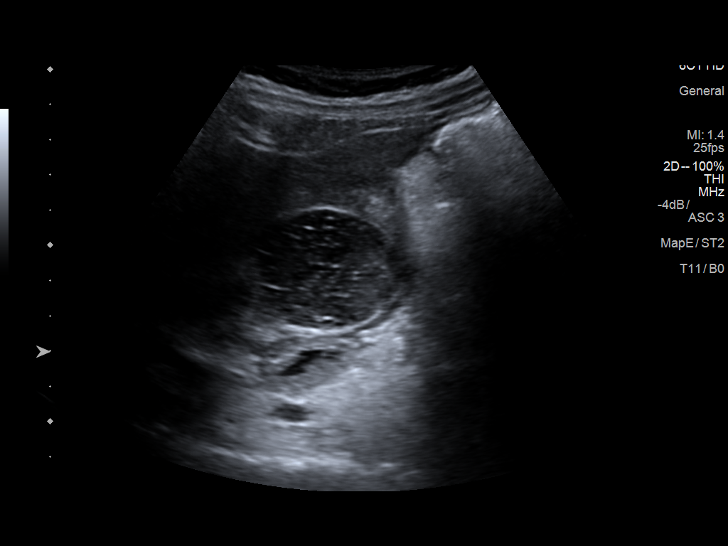
[im 5/51]
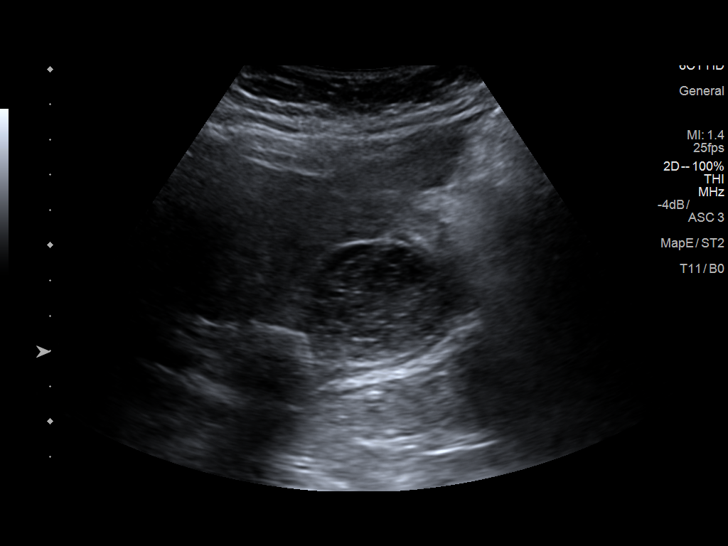
[im 9/51]
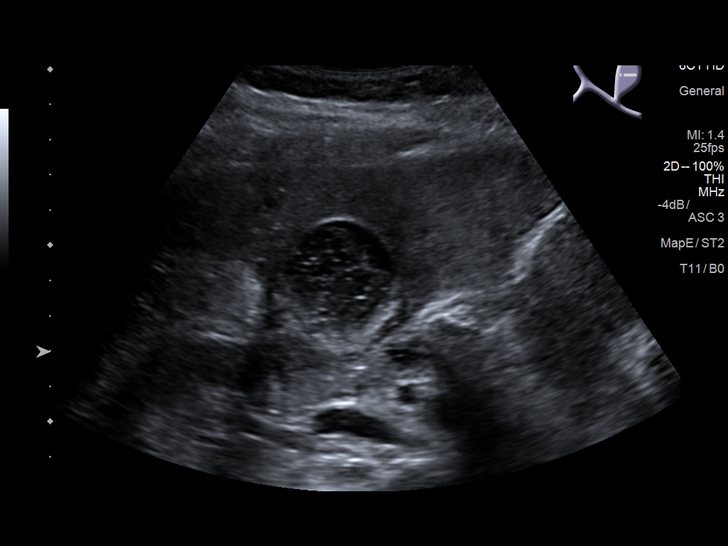
[im 13/51]
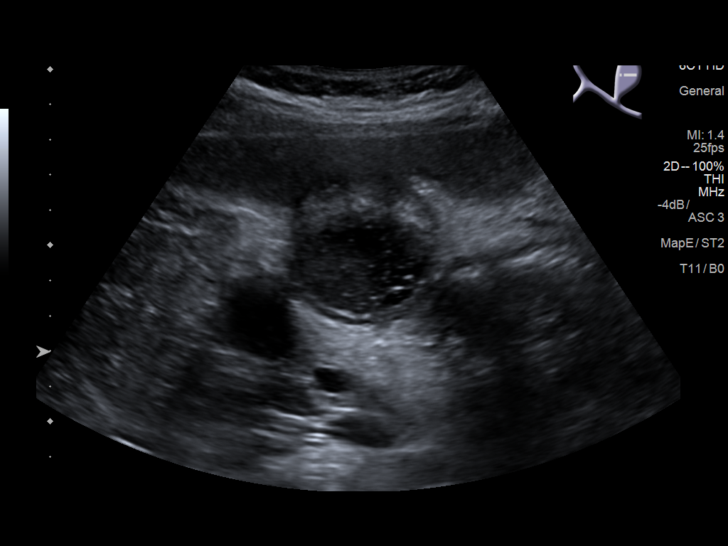
[im 17/51]
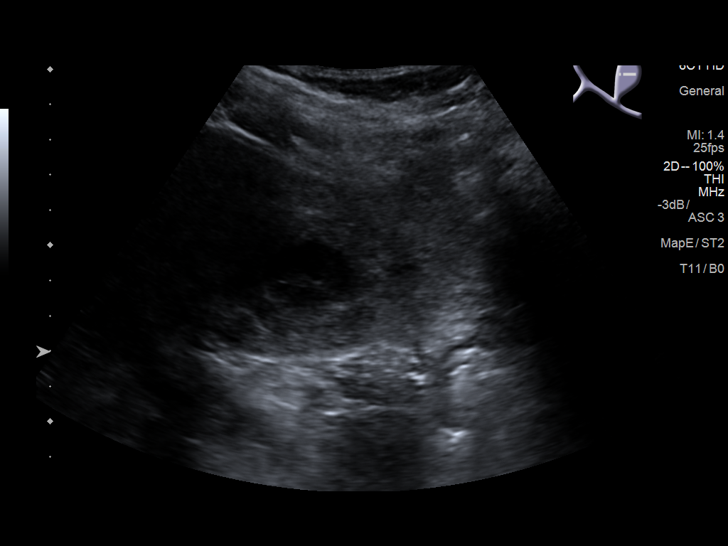
[im 19/51]
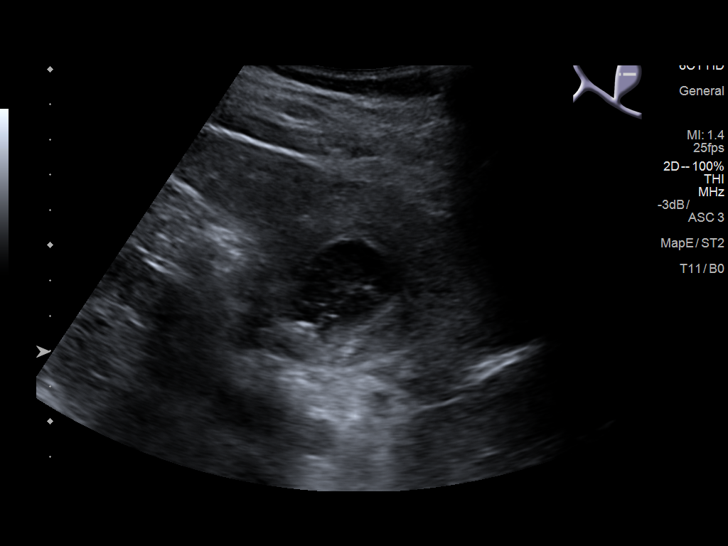
[im 23/51]
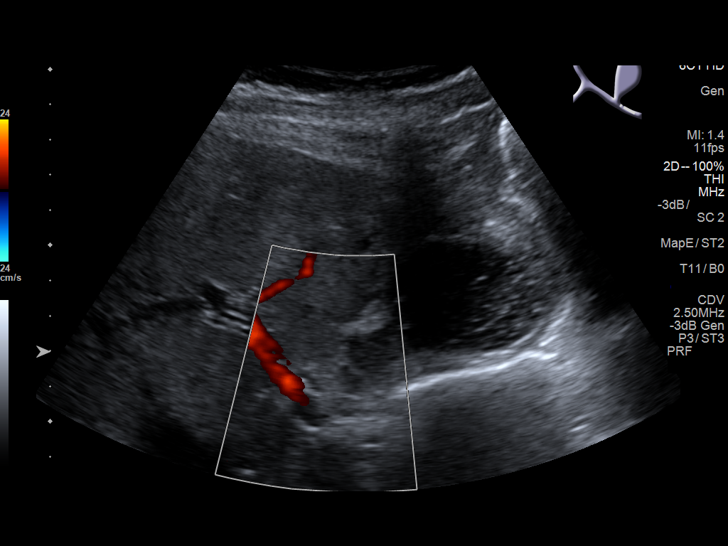
[im 28/51]
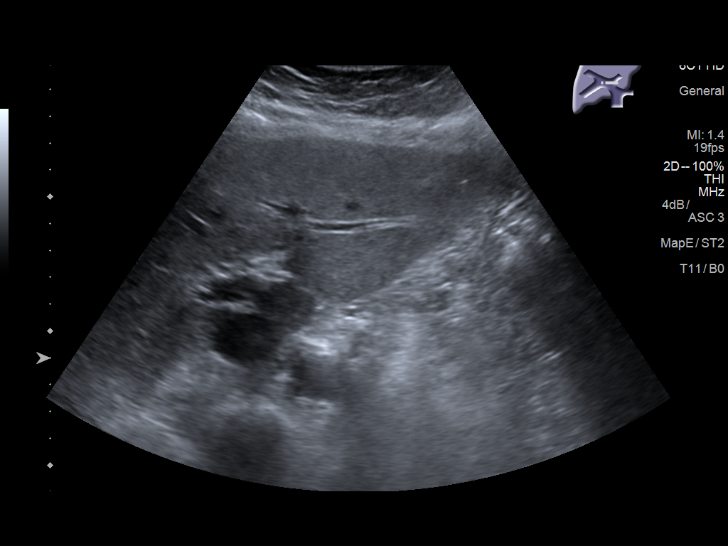
[im 32/51]
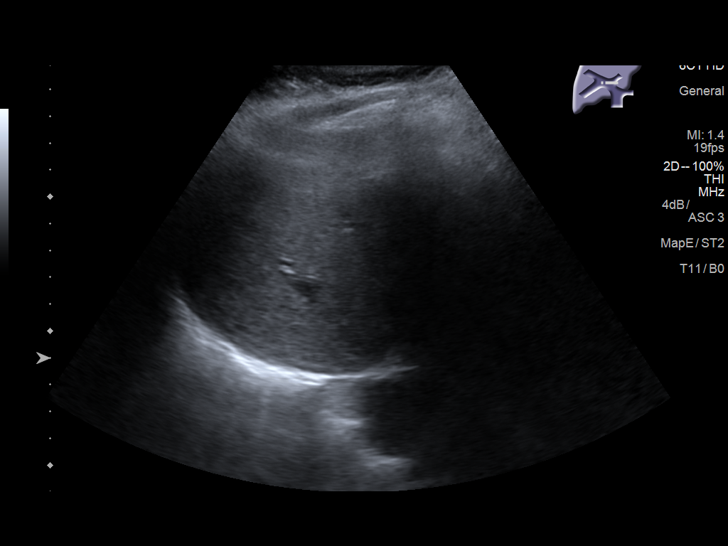
[im 34/51]
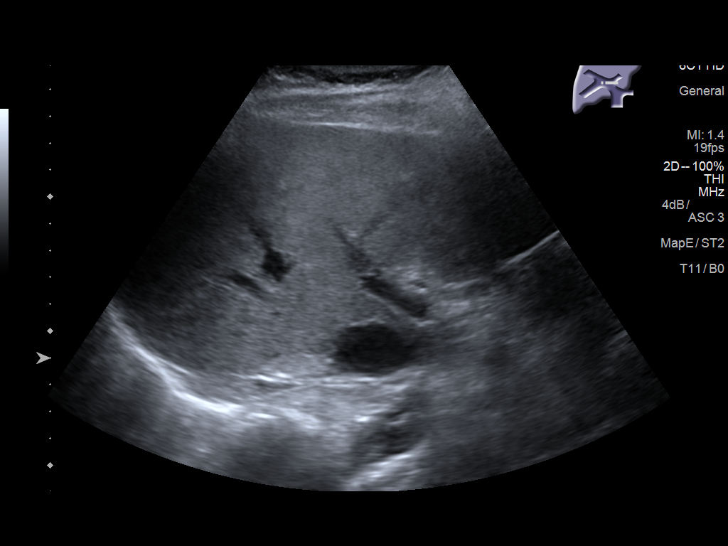
[im 38/51]
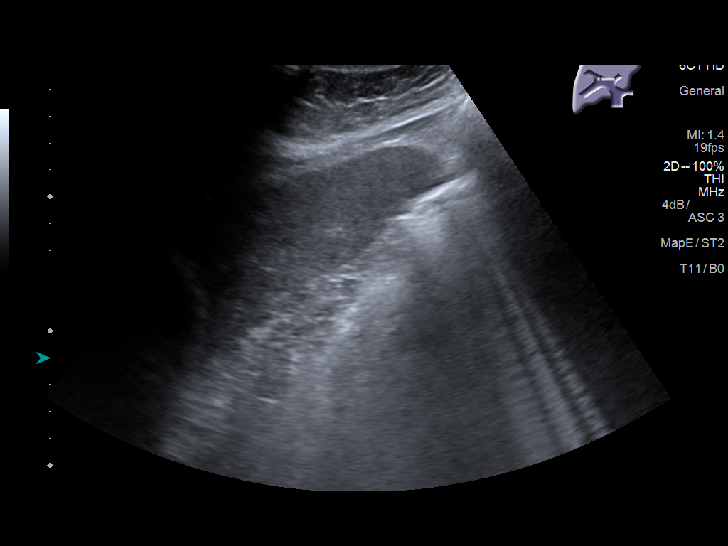
[im 42/51]
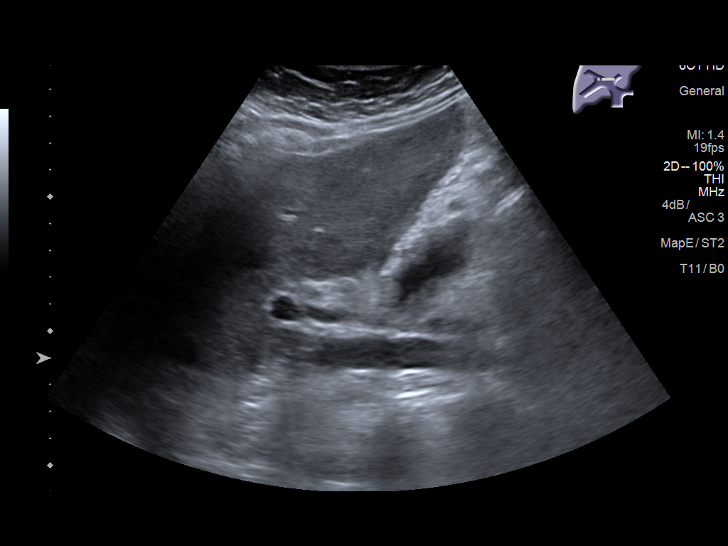
[im 46/51]
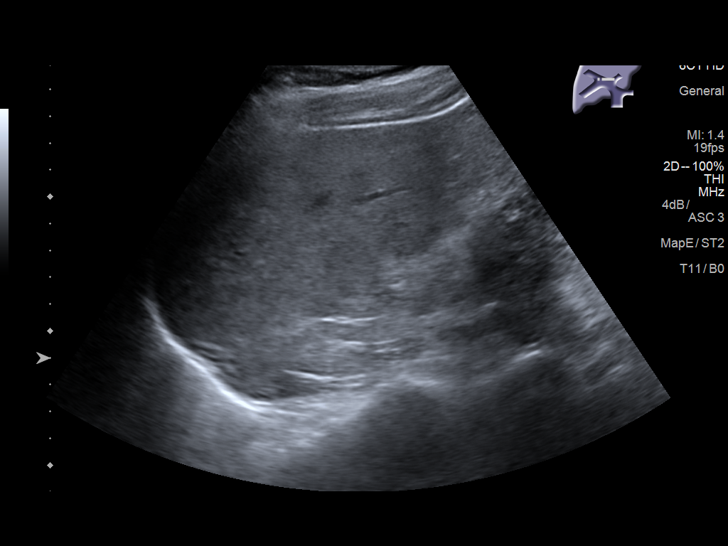
[im 51/51]
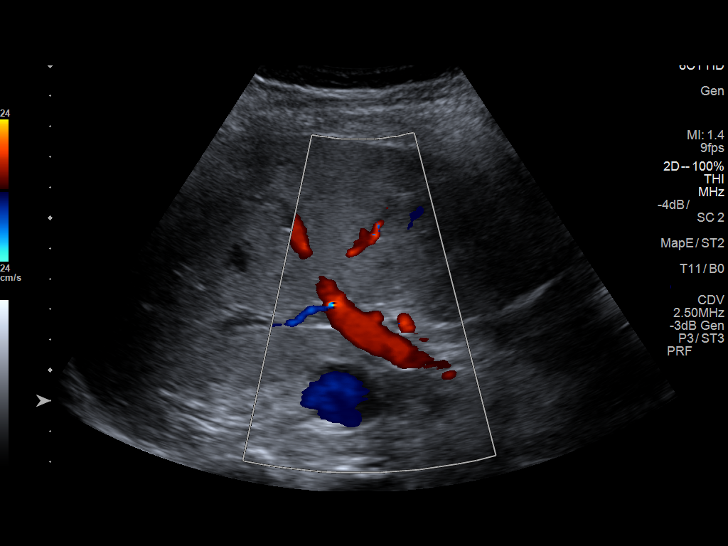

[14 of 25 positions shown; findings below may reference images not displayed]

FINDINGS: Gallbladder:

Gallbladder filled with sludge as well as a 20 mm diameter
gallstone. Question focal wall thickening at the upper to mid
gallbladder, question mass versus inspissated bile. No
pericholecystic fluid or sonographic Murphy sign.

Common bile duct:

Diameter: 4 mm diameter, normal

Liver:

Echogenic parenchyma, likely fatty infiltration though this can be
seen with cirrhosis and certain infiltrative disorders. No definite
focal hepatic mass lesion. Portal vein is patent on color Doppler
imaging with normal direction of blood flow towards the liver.

No RIGHT upper quadrant free fluid.

Incidentally noted increased echogenicity of the renal cortex
consistent with medical renal disease.
IMPRESSION: Gallstone and significant sludge within the gallbladder without
evidence acute cholecystitis.

Questionable asymmetric focal wall thickening of the gallbladder
wall at the mid to upper gallbladder, unable to exclude gallbladder
mass; surgical consultation recommended.

## 2020-03-22 NOTE — Telephone Encounter (Addendum)
Relation to pt: self  Caller Name: [phone]   Reason for call:  Patient states she can not use small needles because its messing her leg up. Patient states she would like a call back today, please advise

## 2020-03-25 NOTE — Telephone Encounter (Signed)
Spoke to the patient. Patient was informed that she will need to call the pharmacy for her next deliver, the Rx was sent to her mail pharmacy.

## 2020-03-28 ENCOUNTER — Telehealth: Payer: Self-pay | Admitting: Nurse Practitioner

## 2020-03-28 NOTE — Telephone Encounter (Signed)
Copied from [place] [id number]. Topic: General - Inquiry >> Mar 28, 2020 12:05 PM Gillis Ends D wrote: Reason for CRM: Patient would like a nurse to call her back from the nurse about her colon cancer kit. She doesn't know how to do it. She can't read the directions or anything and is getting very upset. She said she was going to  throw it in the trash can. She can be reached at [phone]. Please advise

## 2020-03-31 ENCOUNTER — Telehealth: Payer: Self-pay | Admitting: Nurse Practitioner

## 2020-03-31 MED ORDER — TRUEPLUS PEN NEEDLES 31G X 8 MM MISC
2 refills | Status: DC
Start: 2020-03-31 — End: 2020-12-07

## 2020-03-31 NOTE — Telephone Encounter (Signed)
Rx sent 

## 2020-03-31 NOTE — Telephone Encounter (Signed)
Spoke to patient informed on how to do the fecal kit for colon cancer screening. Patient understood.

## 2020-03-31 NOTE — Telephone Encounter (Signed)
Medication Refill - Medication:  Insulin Pen Needle (TRUEPLUS PEN NEEDLES) 31G X 8 MM MISC / Long needles     Preferred Pharmacy (with phone number or street name):  [place], [place] [address]. Phone:  [phone]  Fax:  [phone]       Agent: Please be advised that RX refills may take up to 3 business days. We ask that you follow-up with your pharmacy.

## 2020-04-12 IMAGING — MG DIGITAL SCREENING BILATERAL MAMMOGRAM WITH TOMO AND CAD
8 series · 9 of 24 positions shown · non-contrast
Comparison: Previous exam(s).

CLINICAL DATA: Screening.

EXAM:
DIGITAL SCREENING BILATERAL MAMMOGRAM WITH TOMO AND CAD

[R MLO synth-2D]
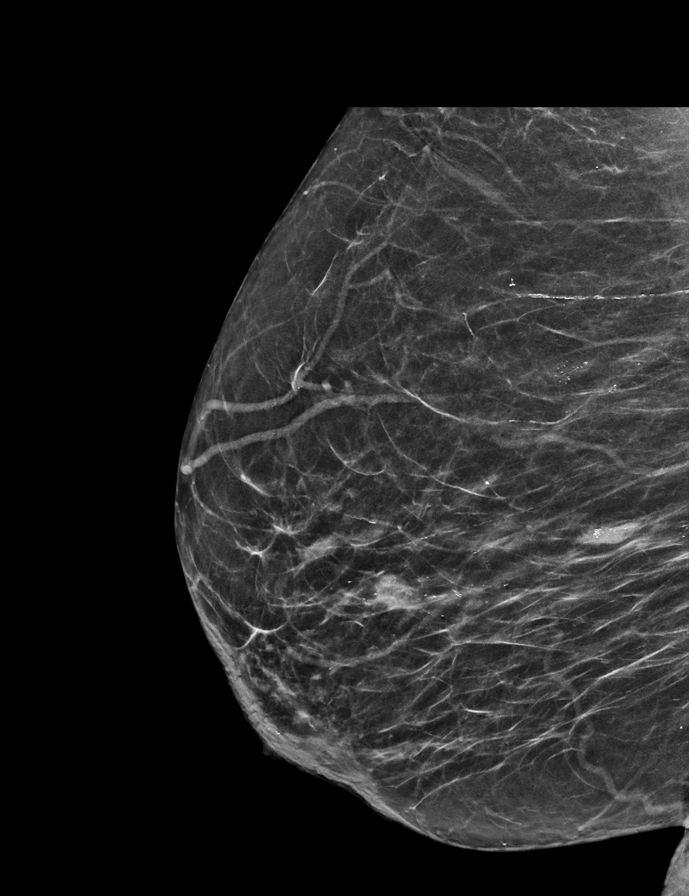

[L CC synth-2D]
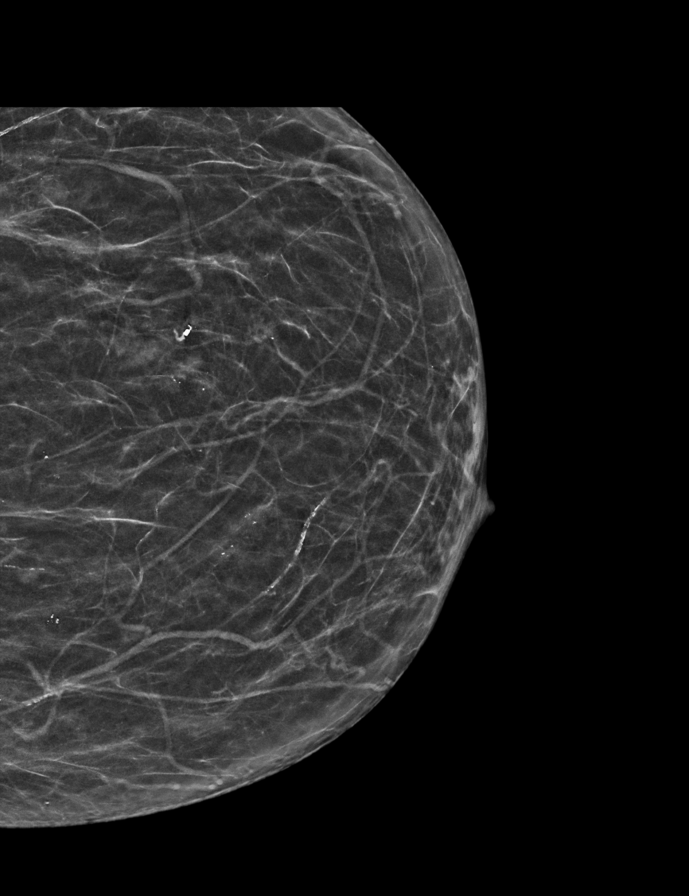

[R CC synth-2D]
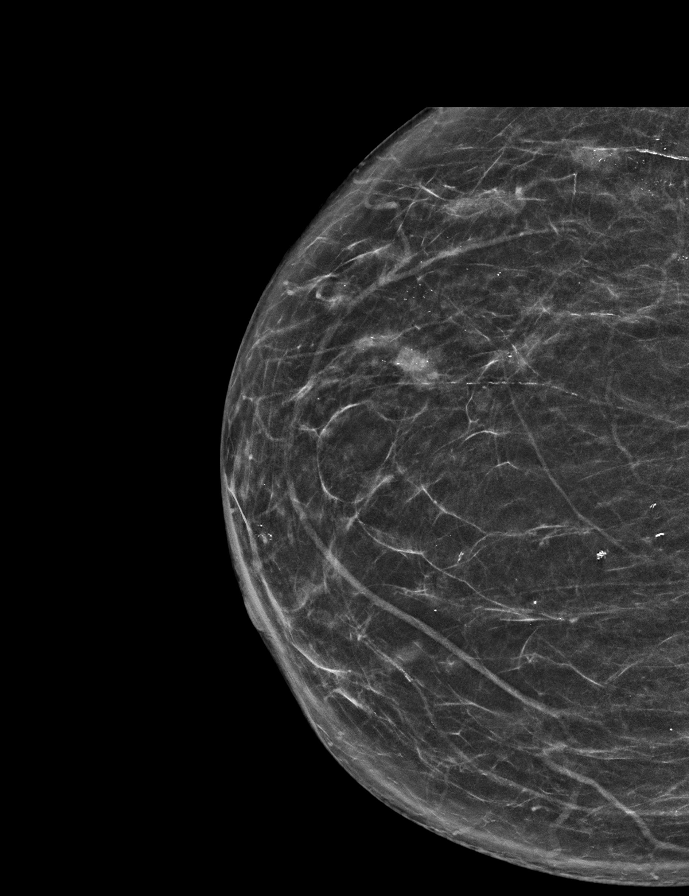

[L MLO synth-2D]
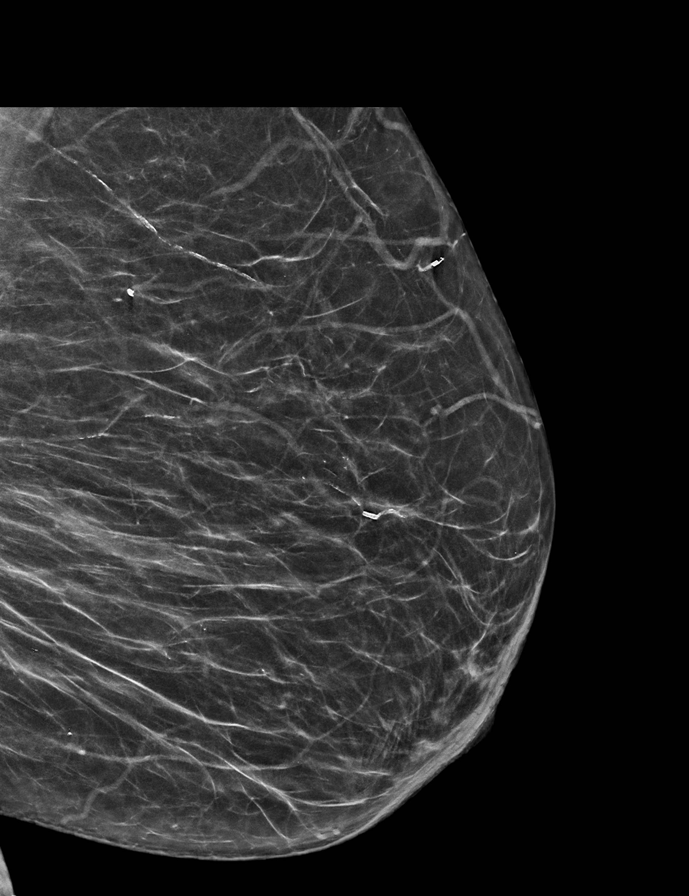

[R CC tomo · 2 of 48 frames shown]
[frame 16/48]
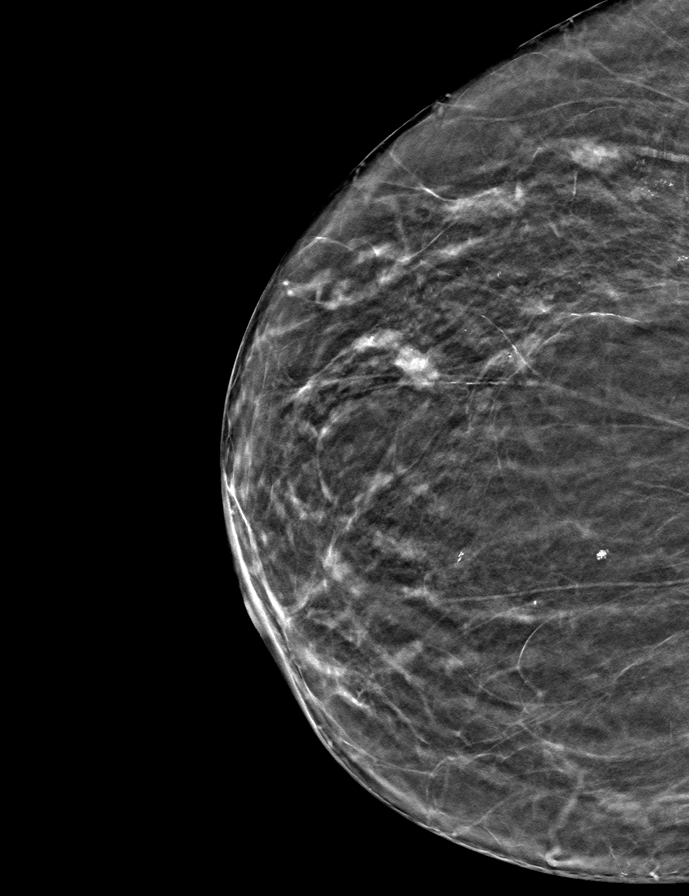
[frame 25/48]
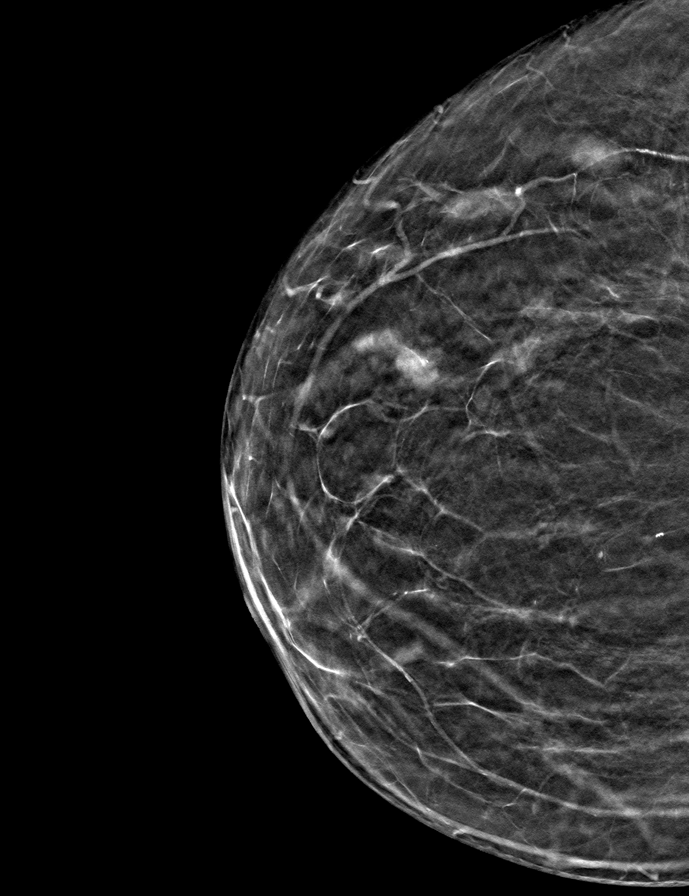

[L CC tomo · tomo slice 23/46.0]
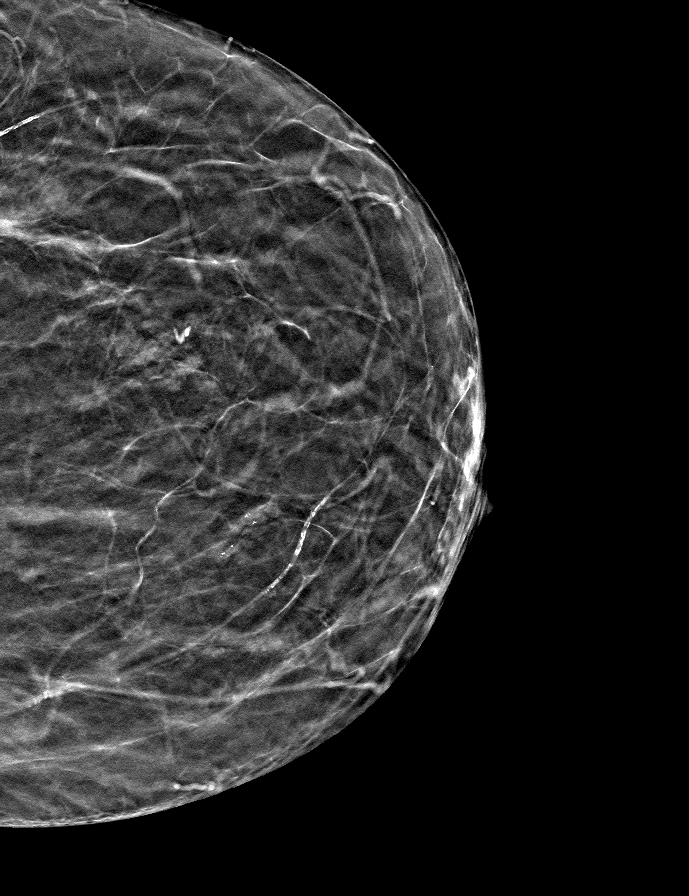

[L MLO tomo · tomo slice 27/54.0]
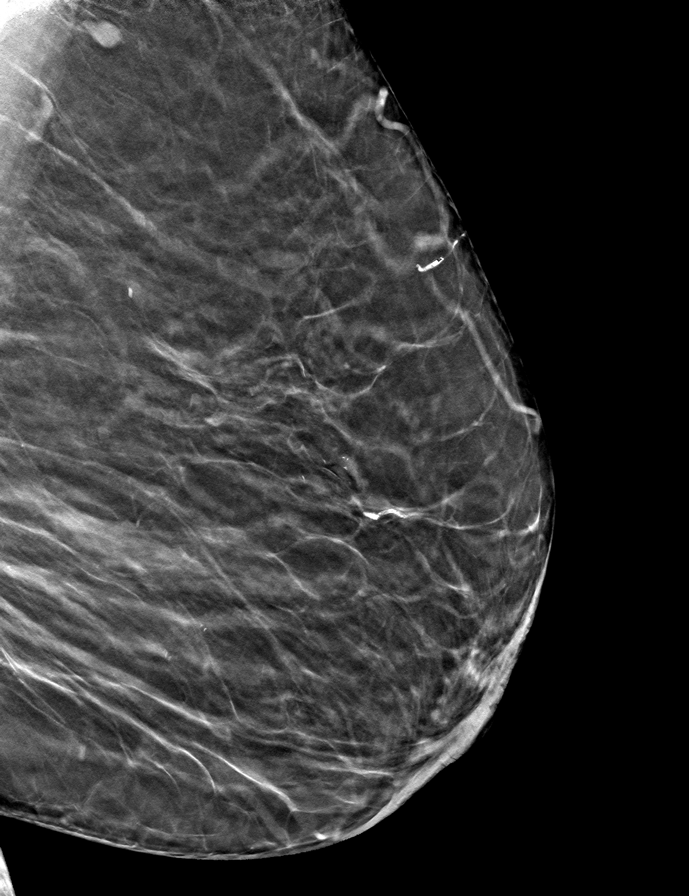

[R MLO tomo · tomo slice 27/53.0]
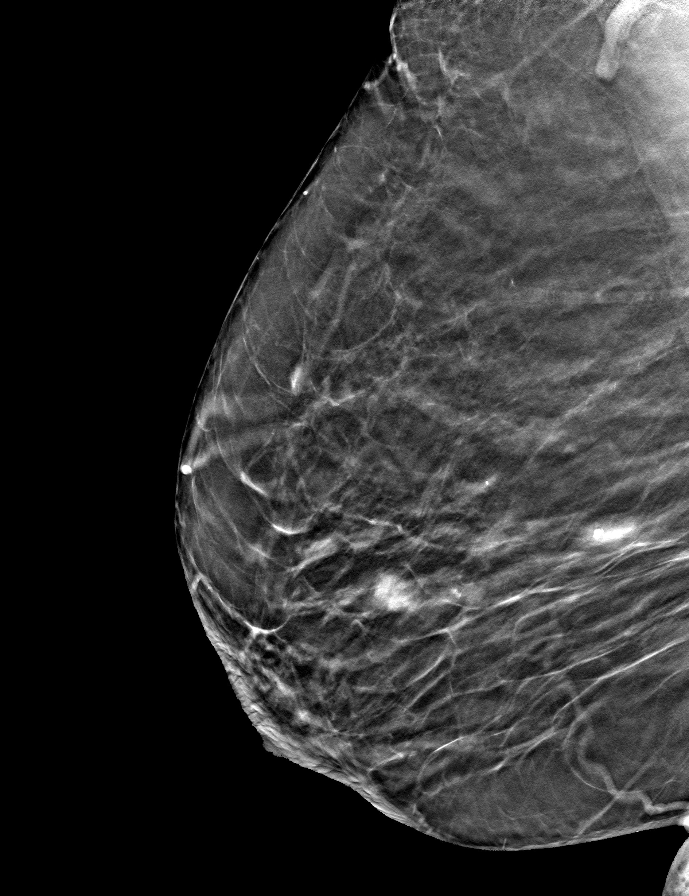

[9 of 24 positions shown; findings below may reference images not displayed]

ACR Breast Density Category b: There are scattered areas of
fibroglandular density.
FINDINGS: In the right breast, calcifications and a possible mass warrants
further evaluation. In the left breast, no findings suspicious for
malignancy. Images were processed with CAD.
IMPRESSION: Further evaluation is suggested for calcifications and possible mass
in the right breast.

RECOMMENDATION:
Diagnostic mammogram and possibly ultrasound of the right breast.
(Code:S6-E-55L)

The patient will be contacted regarding the findings, and additional
imaging will be scheduled.

BI-RADS CATEGORY  0: Incomplete. Need additional imaging evaluation
and/or prior mammograms for comparison.

## 2020-04-13 ENCOUNTER — Telehealth: Payer: Self-pay | Admitting: Nurse Practitioner

## 2020-04-13 NOTE — Telephone Encounter (Signed)
Monica with medication adherence on behalf of [facility]  is calling needing clarification on how the pt takes the liraglutide and what is pt taking medication for. Pt is also non adherence please call back between 8 am and 8 pm CST

## 2020-04-15 ENCOUNTER — Other Ambulatory Visit: Payer: Self-pay | Admitting: Nurse Practitioner

## 2020-04-15 DIAGNOSIS — E1122 Type 2 diabetes mellitus with diabetic chronic kidney disease: Secondary | ICD-10-CM

## 2020-04-18 ENCOUNTER — Other Ambulatory Visit: Payer: Self-pay | Admitting: Nurse Practitioner

## 2020-04-18 DIAGNOSIS — I10 Essential (primary) hypertension: Secondary | ICD-10-CM

## 2020-04-20 NOTE — Telephone Encounter (Signed)
Patient name and DOB verified. Patient is taking the medication: liraglutide (VICTOZA) 18 MG/3ML as directed.

## 2020-05-06 IMAGING — US ULTRASOUND RIGHT BREAST LIMITED
1 series · 12 of 19 positions shown · non-contrast
Comparison: Previous exam(s).

CLINICAL DATA: Calcifications and possible masses in the
upper-outer right breast a recent screening mammogram.

EXAM:
DIGITAL DIAGNOSTIC RIGHT MAMMOGRAM WITH CAD AND TOMO
ULTRASOUND RIGHT BREAST

[Series 1: ultrasound right breast limited · 0.06mm/px · 12 of 19 slices shown]
[im 1/19]
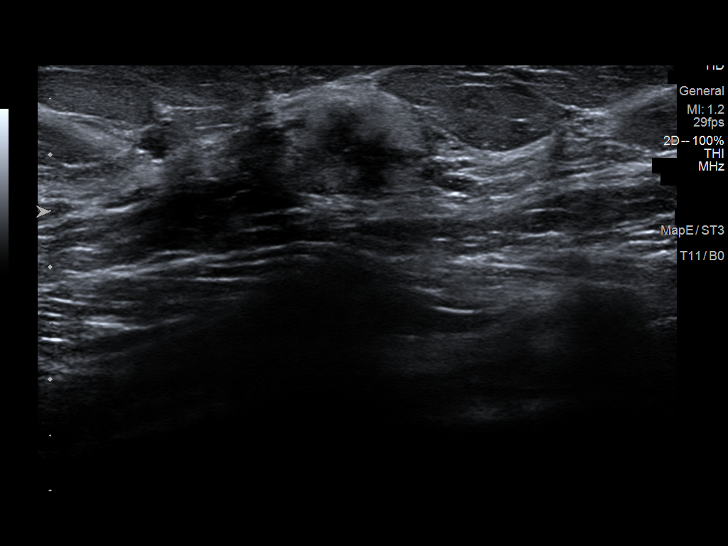
[im 3/19]
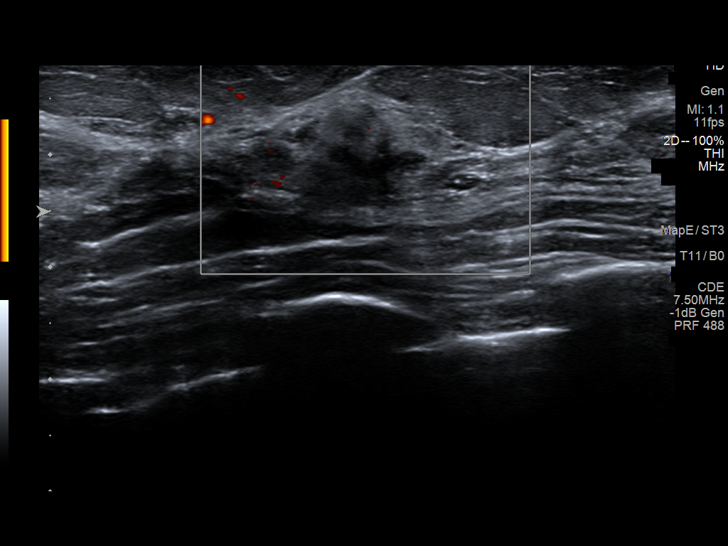
[im 4/19]
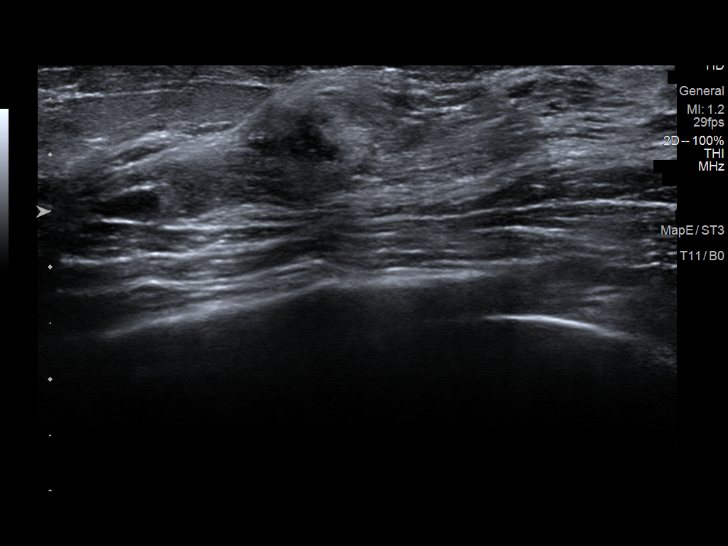
[im 6/19]
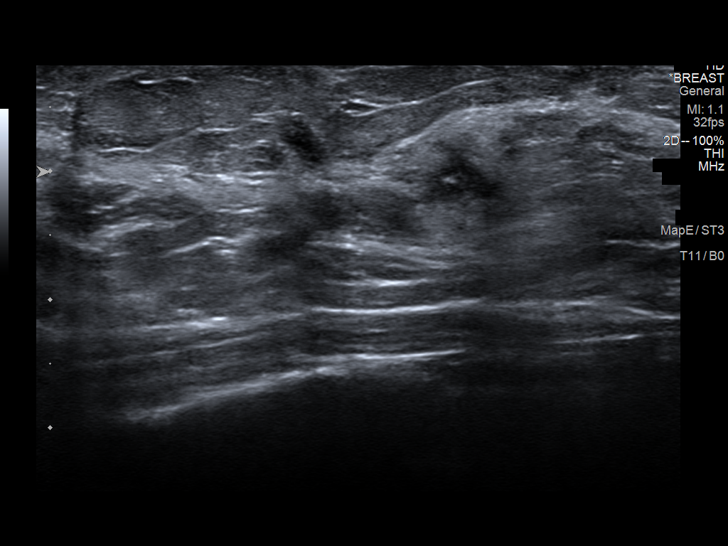
[im 8/19]
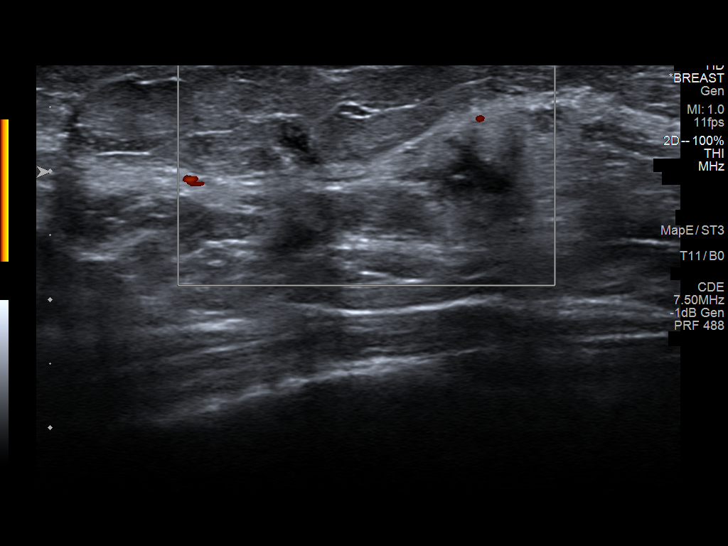
[im 9/19]
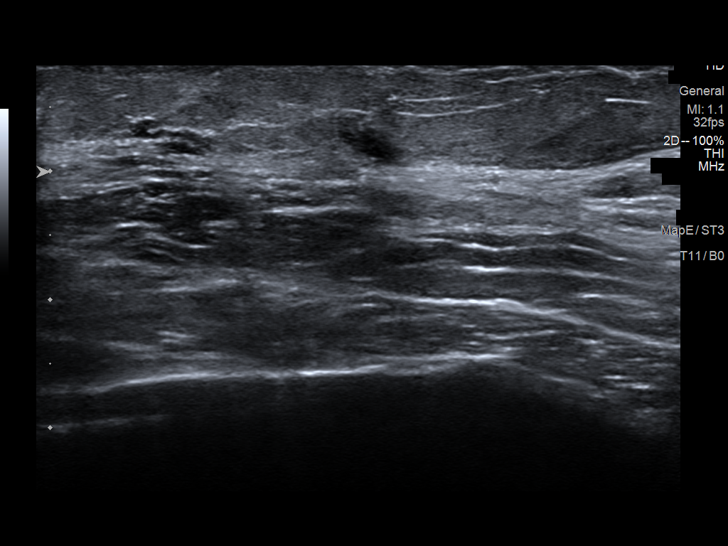
[im 11/19]
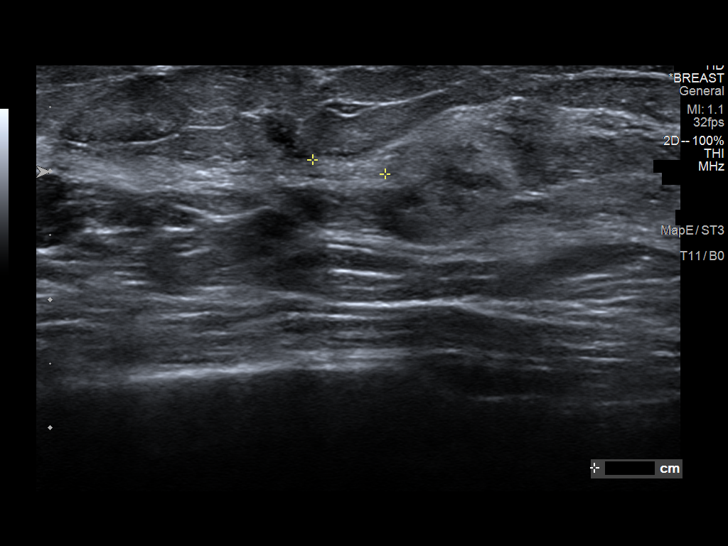
[im 12/19]
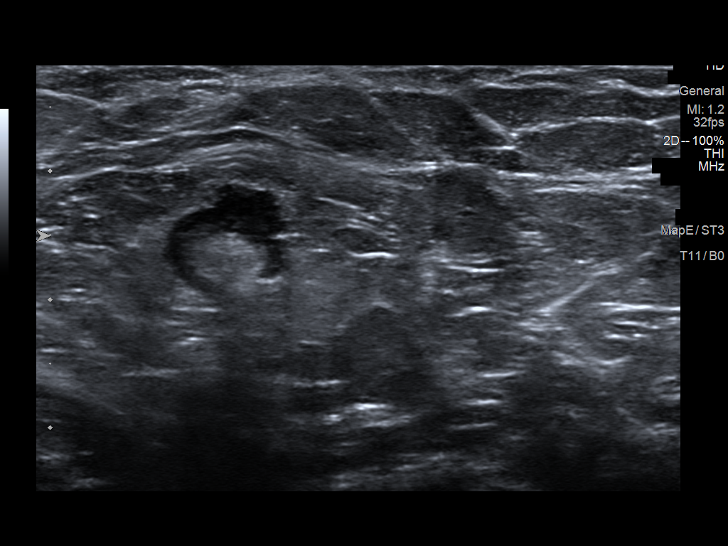
[im 14/19]
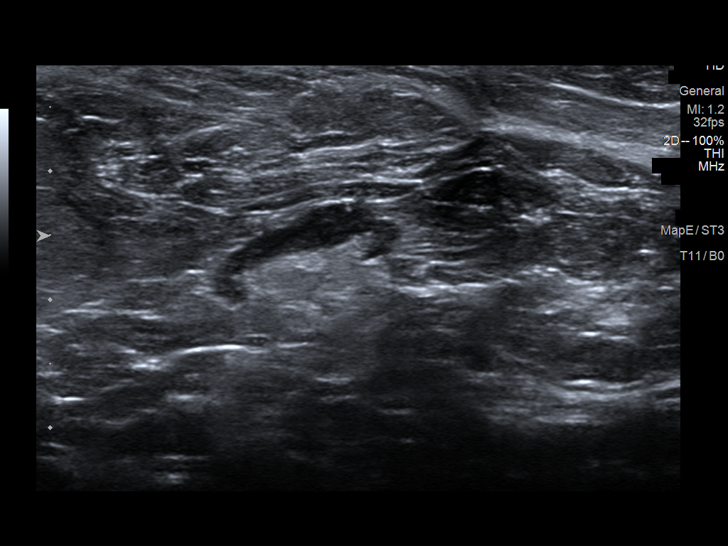
[im 16/19]
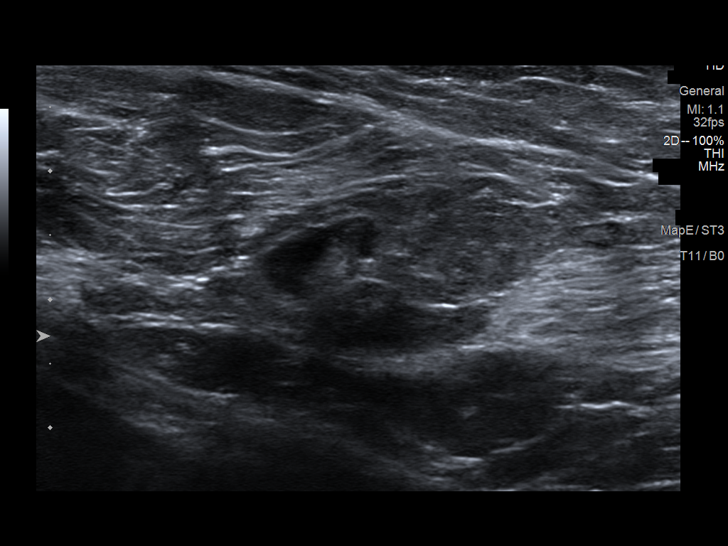
[im 17/19]
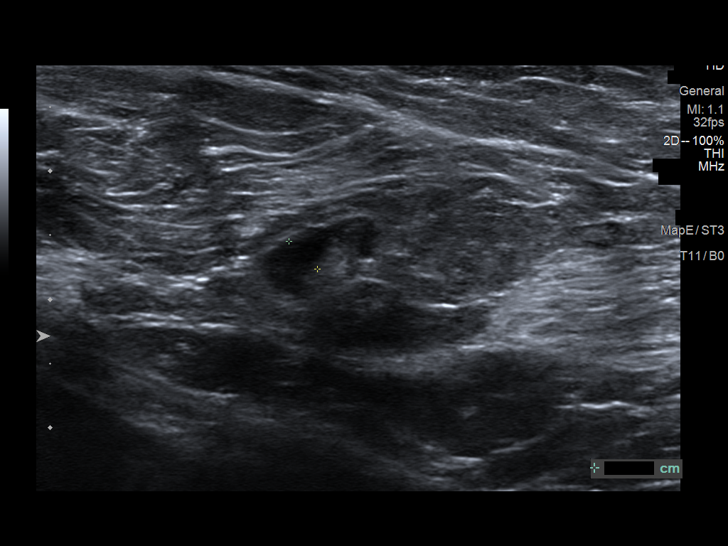
[im 19/19]
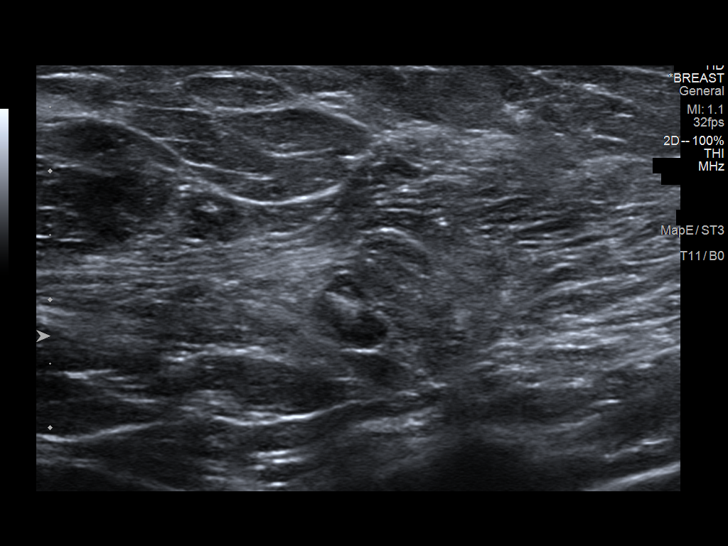

[12 of 19 positions shown; findings below may reference images not displayed]

ACR Breast Density Category b: There are scattered areas of
fibroglandular density.
FINDINGS: 3D tomographic and 2D generated spot compression views of the right
breast were obtained as well as 2D true lateral and spot
magnification views. These confirm an irregular mass with indistinct
margins containing microcalcifications in the upper-outer quadrant
of the breast anteriorly.

There is also an indistinct oval mass-like density in the more
posterior aspect of the outer right breast in the 9 o'clock
position, also containing calcifications.

There is also a group of pleomorphic calcifications in the posterior
aspect of the upper-outer right breast, including linear and linear
branching calcifications. These span 2.0 x 1.5 x 1.2 cm.

There are similar appearing more loosely grouped and scattered
calcifications in the right breast, including the anterior mass, the
more posterior mass-like density and the 2.0 x 1.5 x 1.2 cm grouped
calcifications. Together, these span an area measuring approximately
10.1 x 7.1 x 5.3 cm.

Mammographic images were processed with CAD.

On physical exam, no mass palpable in the outer right breast or
right axilla.

Targeted ultrasound is performed, showing a 1.4 x 1.3 x 1.0 cm
irregular mass in the 10 o'clock position of the right breast, 3 cm
from the nipple, corresponding to the mammographic mass. This is
hypoechoic centrally and has ill-defined increased echogenicity
peripherally.

6 mm more laterally and superiorly, a 5 x 4 x 3 mm mildly irregular,
indistinct hypoechoic areas demonstrated.

There is no ultrasound correlate for the more posteriorly located
mass-like density with calcifications in the 9 o'clock position of
the right breast.

Ultrasound of the right axilla demonstrated a single right axillary
lymph node with focal cortical thickening.
IMPRESSION: 1. 1.4 cm mass in the 10 o'clock position of the right breast, 3 cm
from the nipple, with mammographic and ultrasound features highly
suspicious for malignancy.
2. 2.0 x 1.5 x 1.2 cm group of pleomorphic calcifications in the
posterior aspect of the upper-outer right breast, also suspicious
for malignancy.
3. Additional more loosely grouped and scattered calcifications in
the upper-outer right breast spanning 10.1 x 7.1 x 5.3 cm,
concerning for in situ carcinoma. These include the 2.0 cm group of
suspicious calcifications posteriorly.
4. Single right axillary lymph node with focal cortical thickening,
suspicious for a metastatic node.

RECOMMENDATION:
1. Ultrasound-guided core needle biopsy of the 1.4 cm mass in the 10
o'clock position of the right breast.
2. Ultrasound-guided core needle biopsy of the suspicious right
axillary lymph node.
3. Stereotactic guided core needle biopsy of the 2.0 cm group of
calcifications in the posterior aspect of the upper-outer right
breast.

The findings and recommendation have been discussed the patient and
the biopsies have been scheduled at [DATE] p.m. on 09/03/2017

I have discussed the findings and recommendations with the patient.
Results were also provided in writing at the conclusion of the
visit. If applicable, a reminder letter will be sent to the patient
regarding the next appointment.

BI-RADS CATEGORY  5: Highly suggestive of malignancy.

## 2020-05-06 IMAGING — MG DIGITAL DIAGNOSTIC UNILATERAL RIGHT MAMMOGRAM WITH TOMO AND CAD
8 of 10 series · 8 of 18 positions shown · non-contrast
Comparison: Previous exam(s).

CLINICAL DATA: Calcifications and possible masses in the
upper-outer right breast a recent screening mammogram.

EXAM:
DIGITAL DIAGNOSTIC RIGHT MAMMOGRAM WITH CAD AND TOMO
ULTRASOUND RIGHT BREAST

[R ML (1 of 3)]
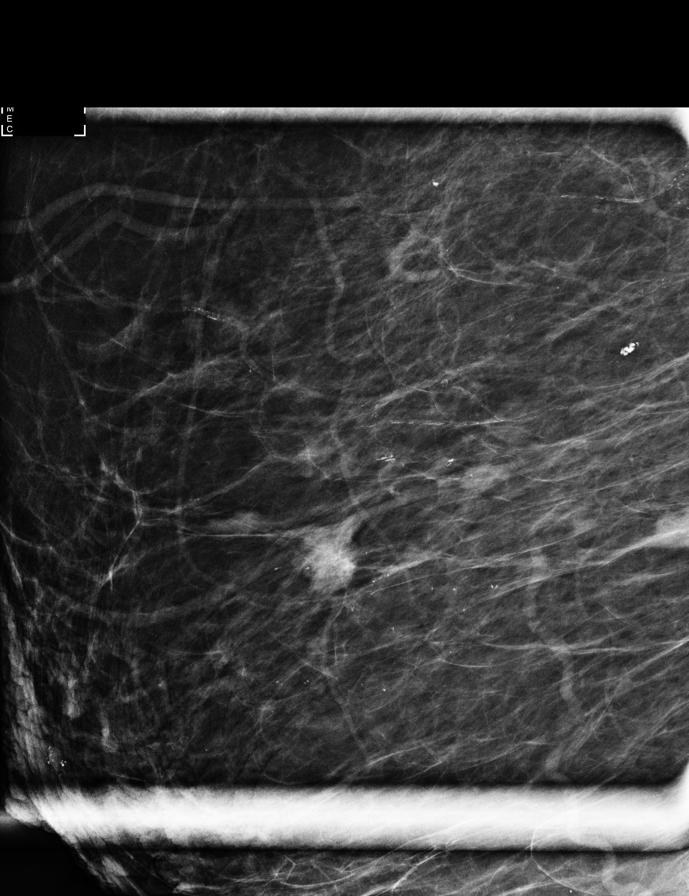

[R ML (2 of 3)]
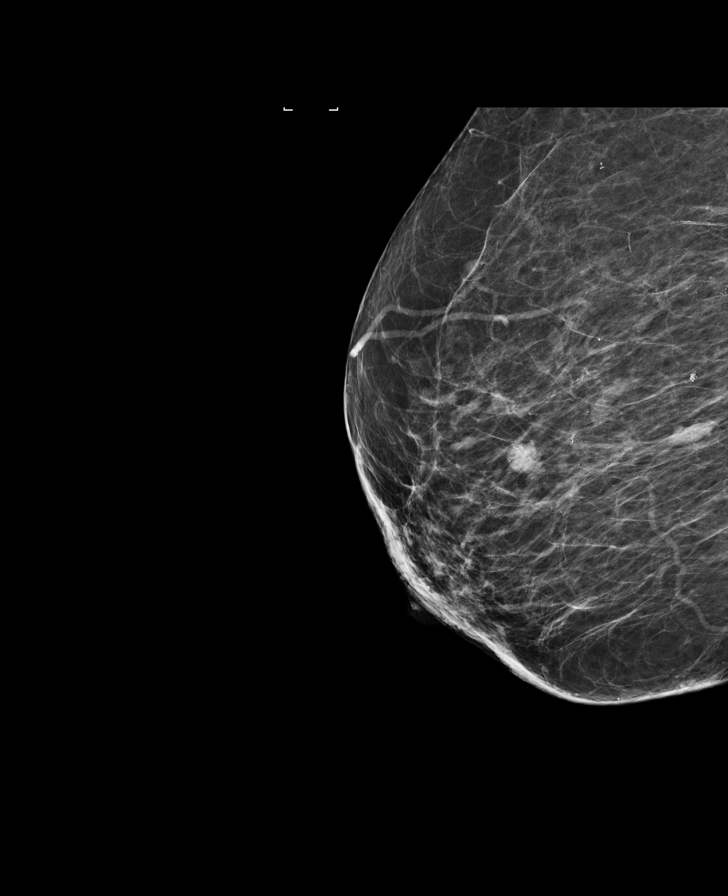

[R ML (3 of 3)]
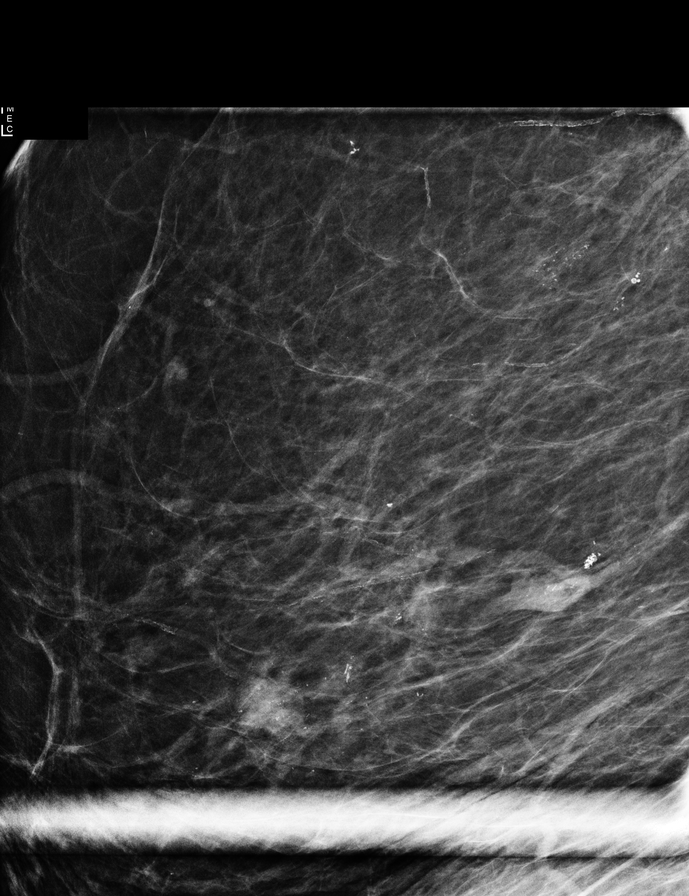

[R CC (1 of 3)]
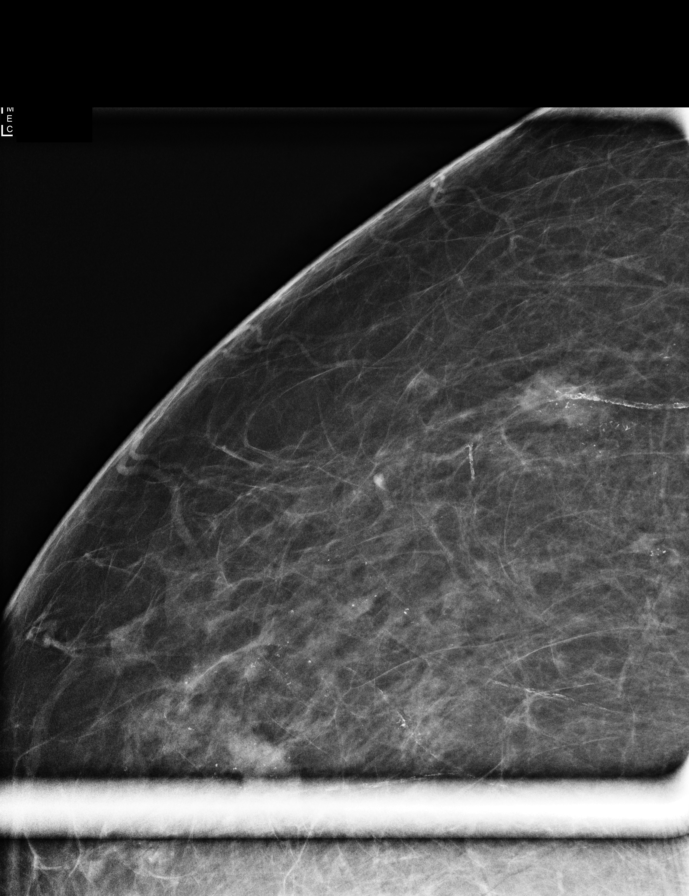

[R CC (2 of 3)]
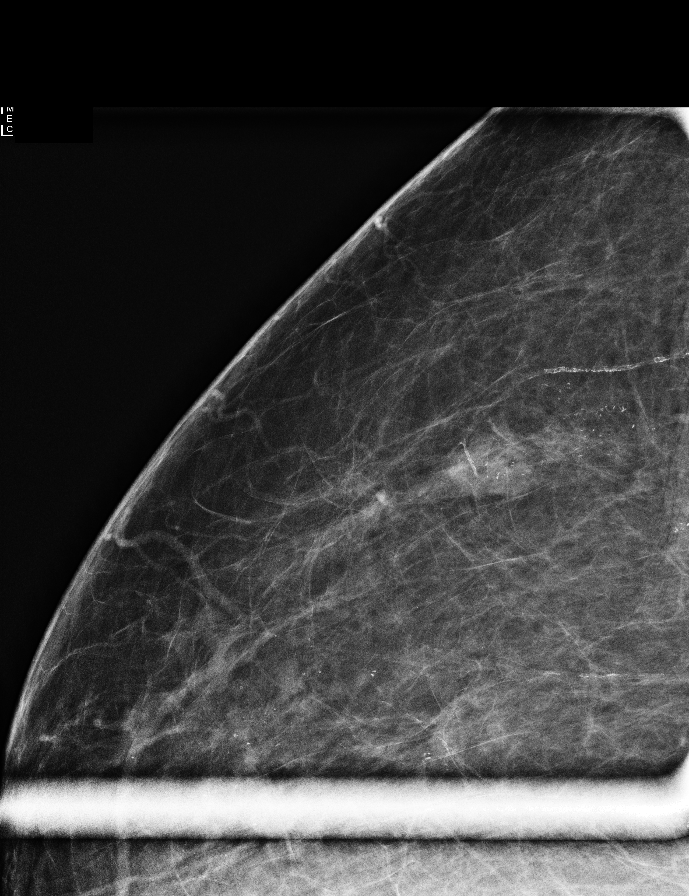

[R CC (3 of 3)]
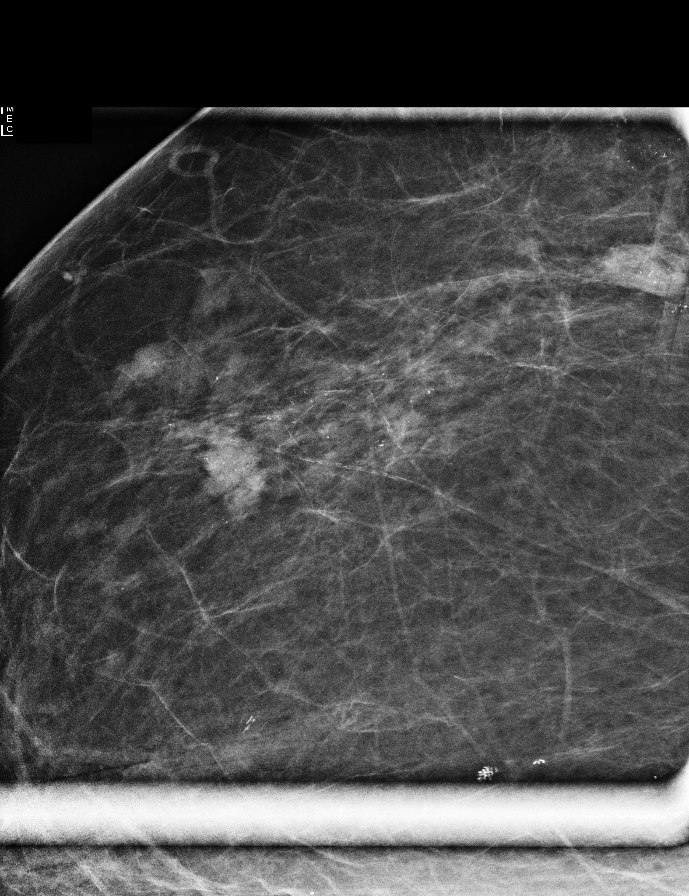

[R MLO synth-2D]
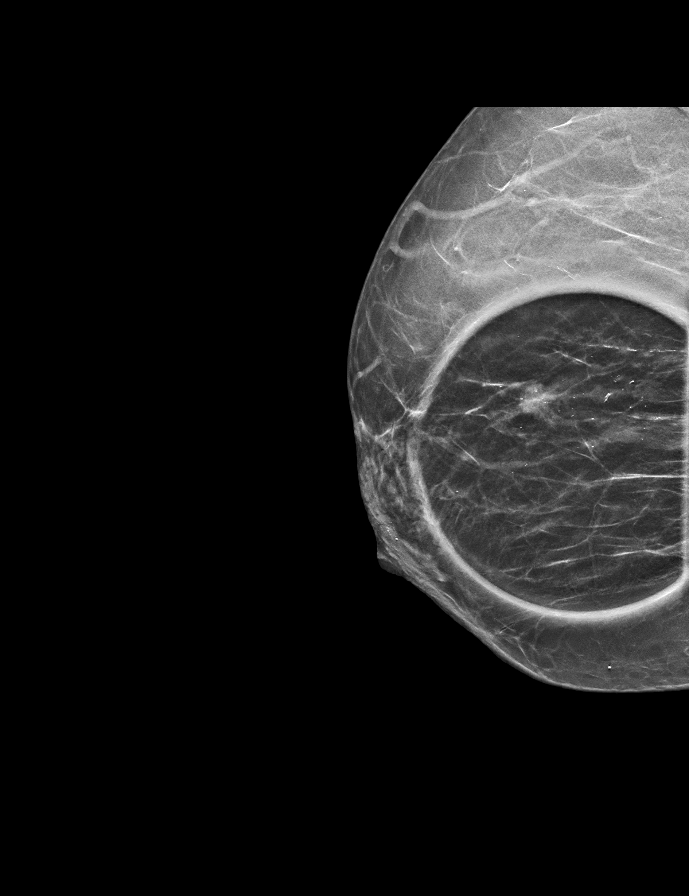

[R CC synth-2D]
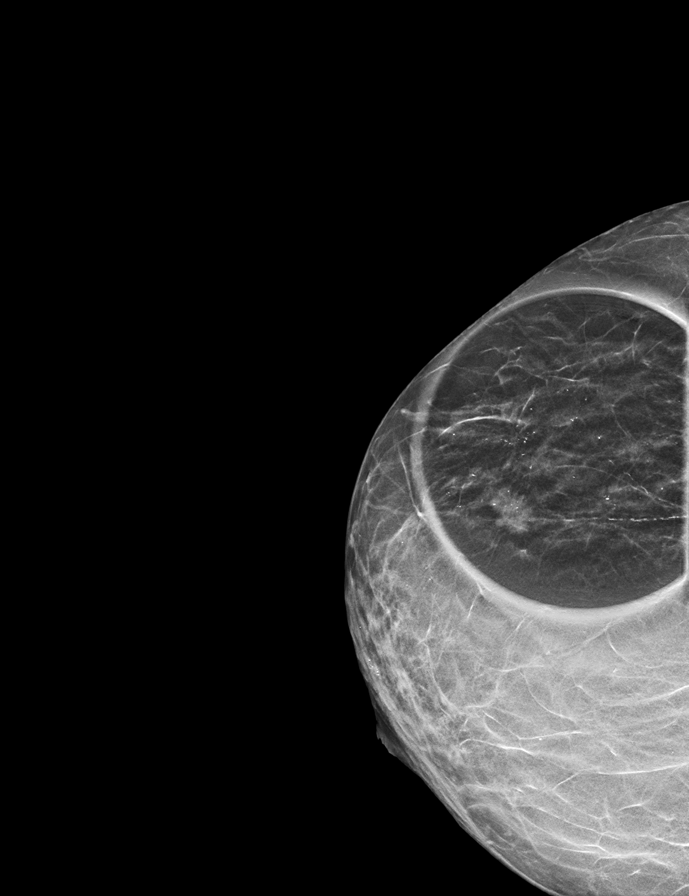

[8 of 18 positions shown; findings below may reference images not displayed]

ACR Breast Density Category b: There are scattered areas of
fibroglandular density.
FINDINGS: 3D tomographic and 2D generated spot compression views of the right
breast were obtained as well as 2D true lateral and spot
magnification views. These confirm an irregular mass with indistinct
margins containing microcalcifications in the upper-outer quadrant
of the breast anteriorly.

There is also an indistinct oval mass-like density in the more
posterior aspect of the outer right breast in the 9 o'clock
position, also containing calcifications.

There is also a group of pleomorphic calcifications in the posterior
aspect of the upper-outer right breast, including linear and linear
branching calcifications. These span 2.0 x 1.5 x 1.2 cm.

There are similar appearing more loosely grouped and scattered
calcifications in the right breast, including the anterior mass, the
more posterior mass-like density and the 2.0 x 1.5 x 1.2 cm grouped
calcifications. Together, these span an area measuring approximately
10.1 x 7.1 x 5.3 cm.

Mammographic images were processed with CAD.

On physical exam, no mass palpable in the outer right breast or
right axilla.

Targeted ultrasound is performed, showing a 1.4 x 1.3 x 1.0 cm
irregular mass in the 10 o'clock position of the right breast, 3 cm
from the nipple, corresponding to the mammographic mass. This is
hypoechoic centrally and has ill-defined increased echogenicity
peripherally.

6 mm more laterally and superiorly, a 5 x 4 x 3 mm mildly irregular,
indistinct hypoechoic areas demonstrated.

There is no ultrasound correlate for the more posteriorly located
mass-like density with calcifications in the 9 o'clock position of
the right breast.

Ultrasound of the right axilla demonstrated a single right axillary
lymph node with focal cortical thickening.
IMPRESSION: 1. 1.4 cm mass in the 10 o'clock position of the right breast, 3 cm
from the nipple, with mammographic and ultrasound features highly
suspicious for malignancy.
2. 2.0 x 1.5 x 1.2 cm group of pleomorphic calcifications in the
posterior aspect of the upper-outer right breast, also suspicious
for malignancy.
3. Additional more loosely grouped and scattered calcifications in
the upper-outer right breast spanning 10.1 x 7.1 x 5.3 cm,
concerning for in situ carcinoma. These include the 2.0 cm group of
suspicious calcifications posteriorly.
4. Single right axillary lymph node with focal cortical thickening,
suspicious for a metastatic node.

RECOMMENDATION:
1. Ultrasound-guided core needle biopsy of the 1.4 cm mass in the 10
o'clock position of the right breast.
2. Ultrasound-guided core needle biopsy of the suspicious right
axillary lymph node.
3. Stereotactic guided core needle biopsy of the 2.0 cm group of
calcifications in the posterior aspect of the upper-outer right
breast.

The findings and recommendation have been discussed the patient and
the biopsies have been scheduled at [DATE] p.m. on 09/03/2017

I have discussed the findings and recommendations with the patient.
Results were also provided in writing at the conclusion of the
visit. If applicable, a reminder letter will be sent to the patient
regarding the next appointment.

BI-RADS CATEGORY  5: Highly suggestive of malignancy.

## 2020-05-14 IMAGING — MG MM CLIP PLACEMENT
3 series · 3 of 3 positions shown · non-contrast
Comparison: Previous exam(s).

CLINICAL DATA: Post biopsy mammogram of the right breast for clip
placement.

EXAM:
DIAGNOSTIC RIGHT MAMMOGRAM POST ULTRASOUND AND STEREOTACTIC BIOPSY

[R CC]
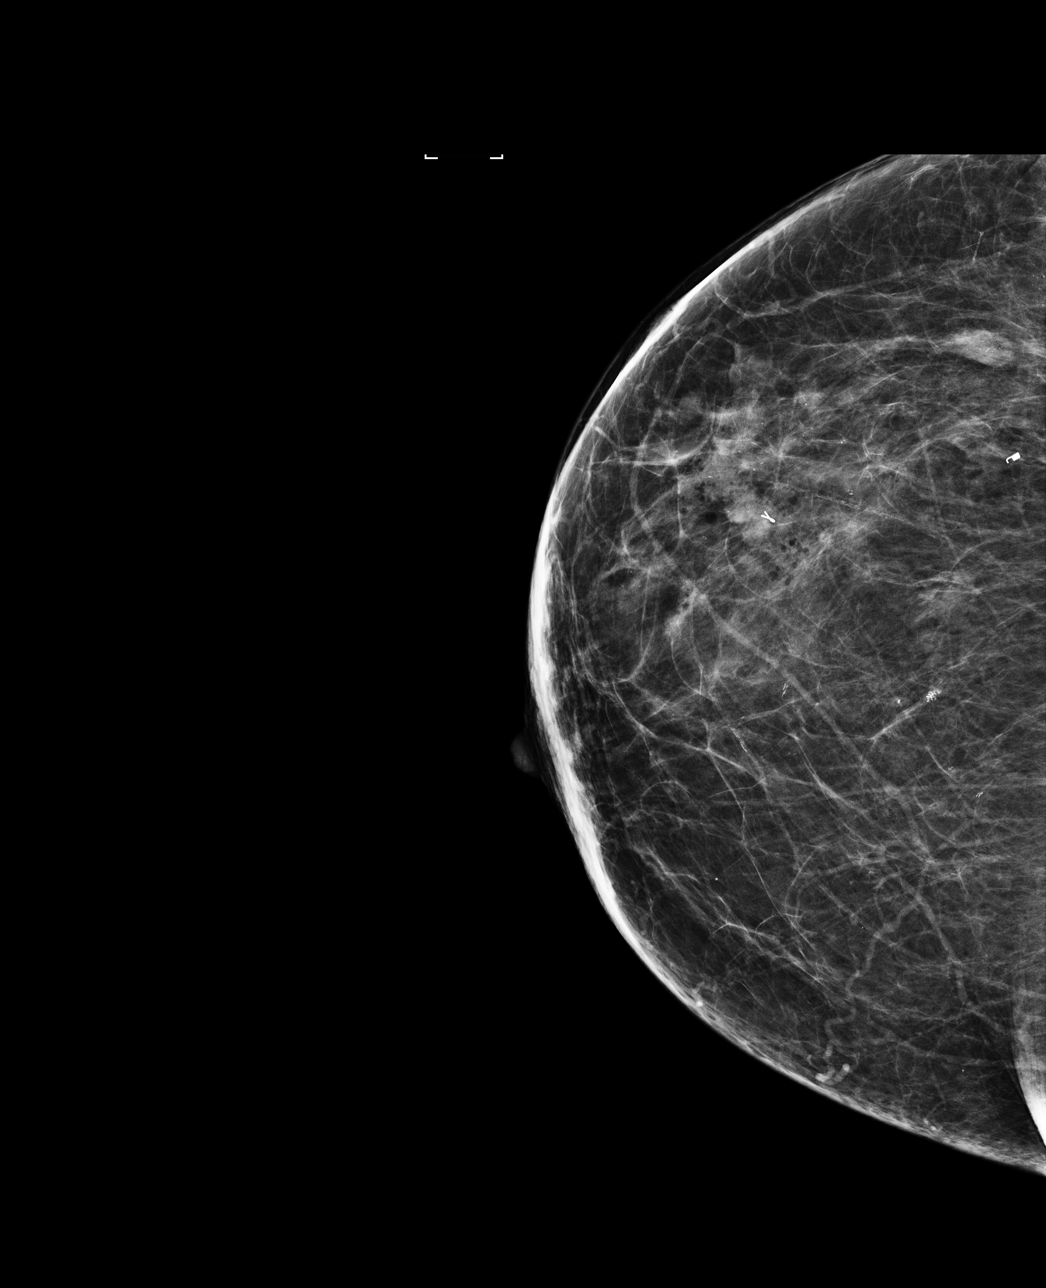

[R ML]
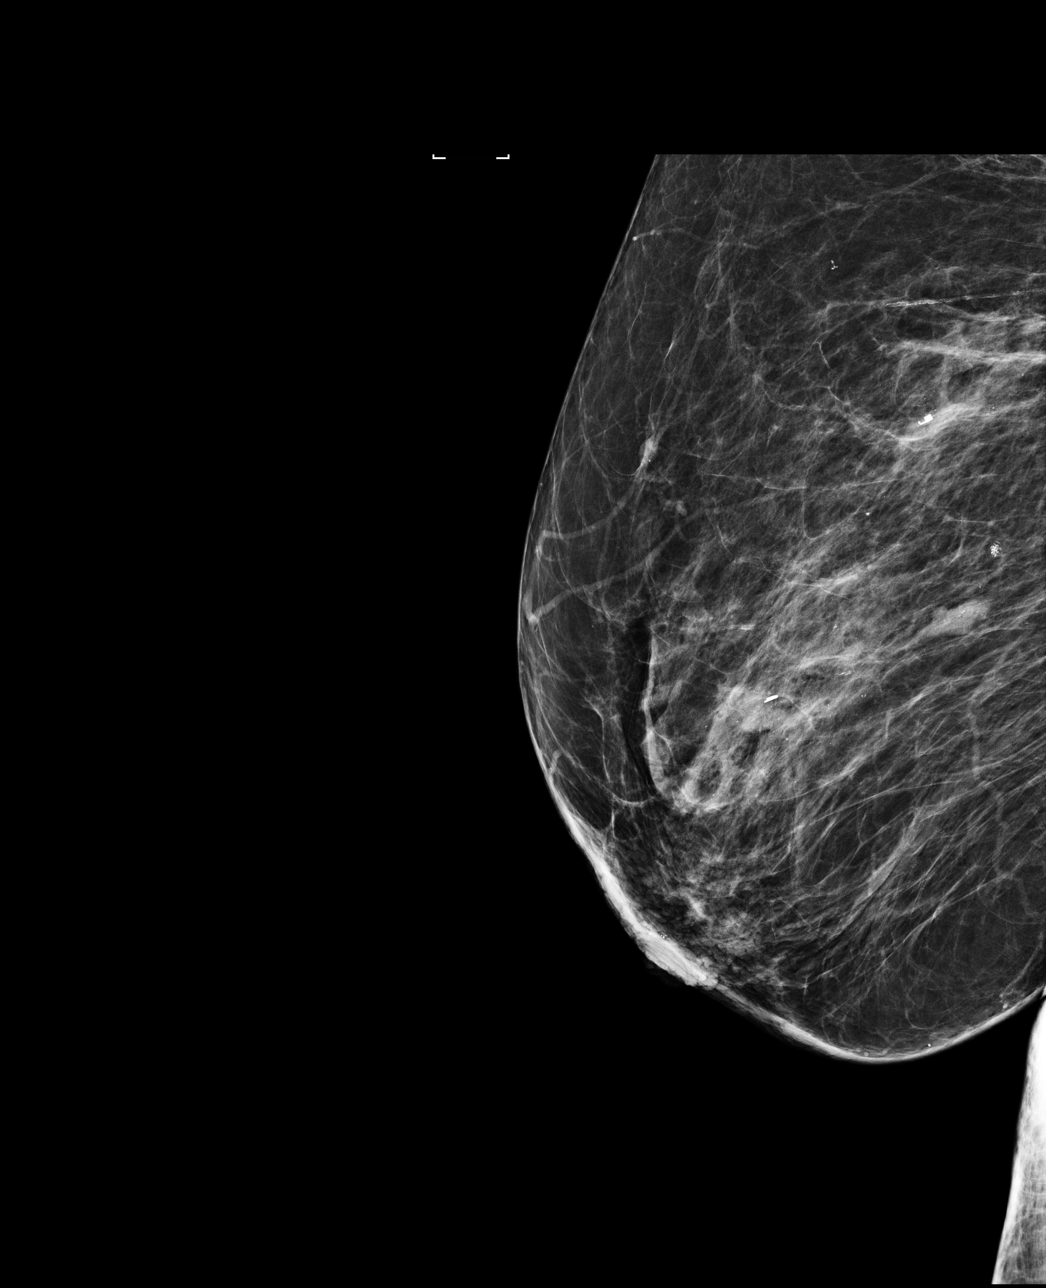

[R MLO]
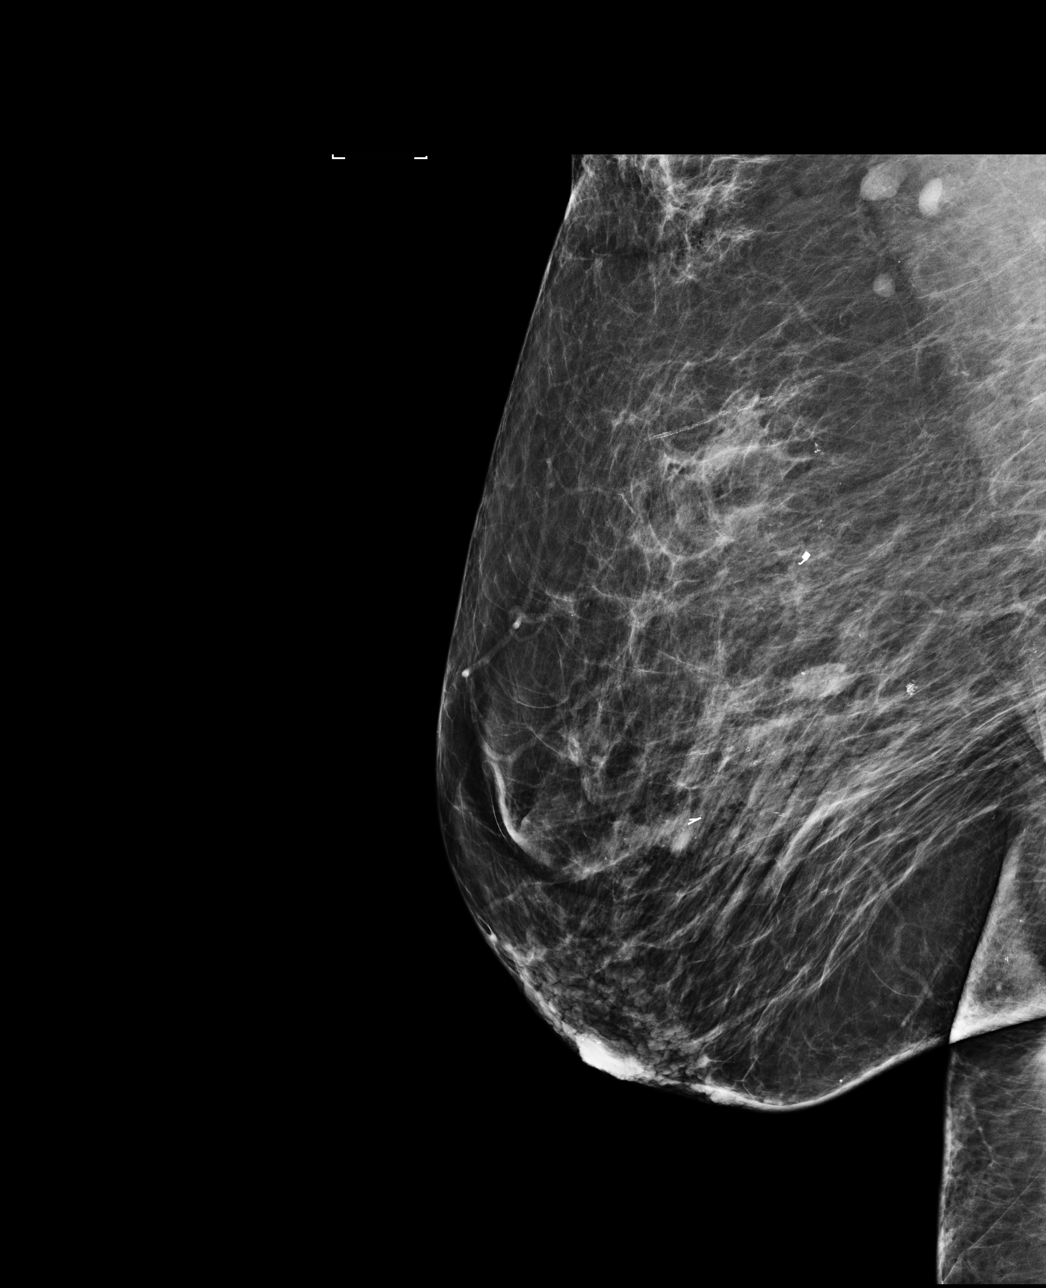

[3 of 3 positions shown; findings below may reference images not displayed]

FINDINGS: Mammographic images were obtained following ultrasound and
stereotactic guided biopsy of the right breast.

The coil shaped biopsy marking clip at the site of the stereotactic
biopsy in the upper outer quadrant, posterior depth of the right
breast is appropriately positioned.

The ribbon shaped biopsy marking clip at the site of the
ultrasound-guided biopsied mass is appropriately positioned at the
upper outer approximate 10 o'clock location.

The spiral shaped biopsy marking clip is seen on the single MLO view
of the right axilla overlying a right axillary lymph node.

The ribbon and coil shaped biopsy marking clips are approximately
6.7 cm apart. There are, however, suspicious calcifications that
extend anterior to the ribbon clip and posterior to the coil clip.

Note that there is still a non-biopsied mass in the upper outer
posterior depth of the right breast that did not have a sonographic
correlate, but is suspicious in appearance.
IMPRESSION: 1. The coil shaped biopsy marking clip in the upper-outer posterior
right breast is positioned at the site of biopsied calcifications.

2. The ribbon shaped biopsy marking clip is well positioned at the
site of the biopsied mass in the upper-outer anterior right breast.
The coil in ribbon shaped biopsy marking clips lie 6.7 cm apart,
though there are suspicious calcifications that extend beyond the
6.7 cm.

3. The spiral shaped biopsy marking clip is seen on a single MLO
projection in the right axilla.

4. Note: there is a suspicious mass with calcifications (no
sonographic correlate) in the upper outer quadrant of the right
breast that has not been biopsied.

Final Assessment: Post Procedure Mammograms for Marker Placement

## 2020-05-14 IMAGING — US US  BREAST BX W/ LOC DEV 1ST LESION IMG BX SPEC US GUIDE*R*
1 series · 13 of 16 positions shown · non-contrast
Comparison: Previous exam(s).

CLINICAL DATA: 67-year-old female presenting for ultrasound-guided
biopsy of a right breast mass and a right axillary lymph node.

EXAM:
ULTRASOUND GUIDED RIGHT BREAST CORE NEEDLE BIOPSY

[Series 1: us breast bx w/ loc dev 1st lesion img bx spec us  · 0.06mm/px · 13 of 16 slices shown]
[im 1/16]
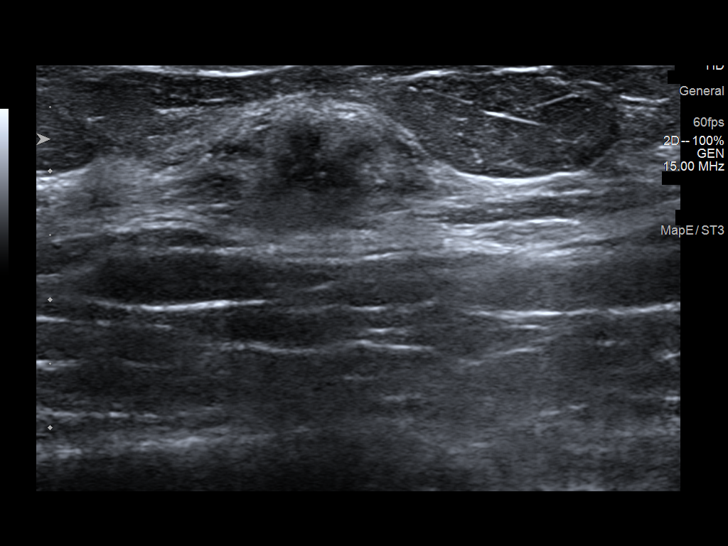
[im 2/16]
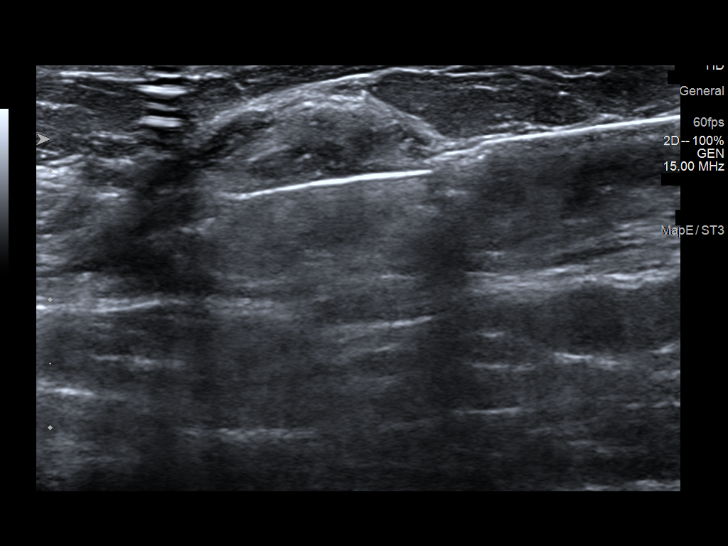
[im 4/16]
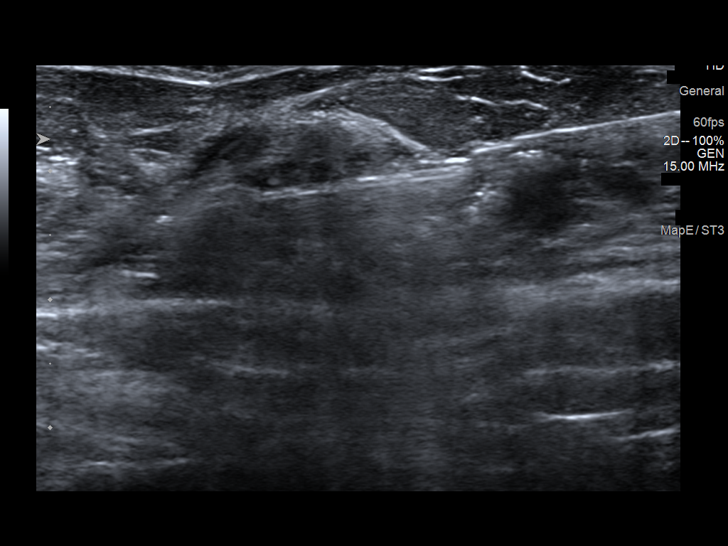
[im 5/16]
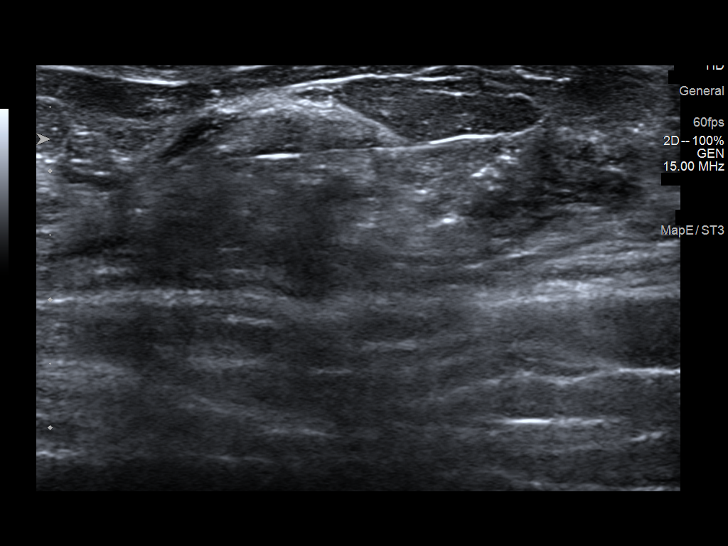
[im 6/16]
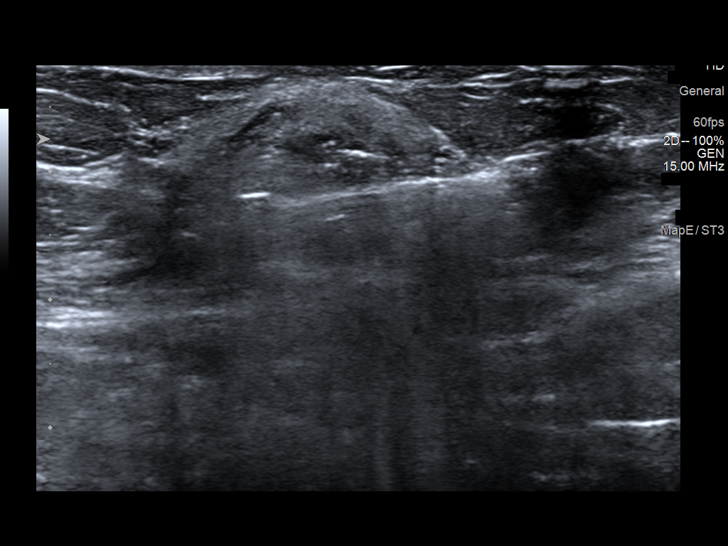
[im 7/16]
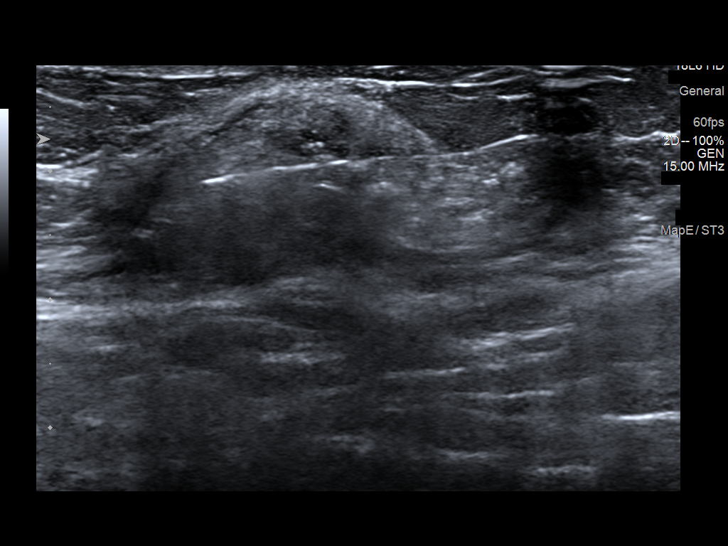
[im 9/16]
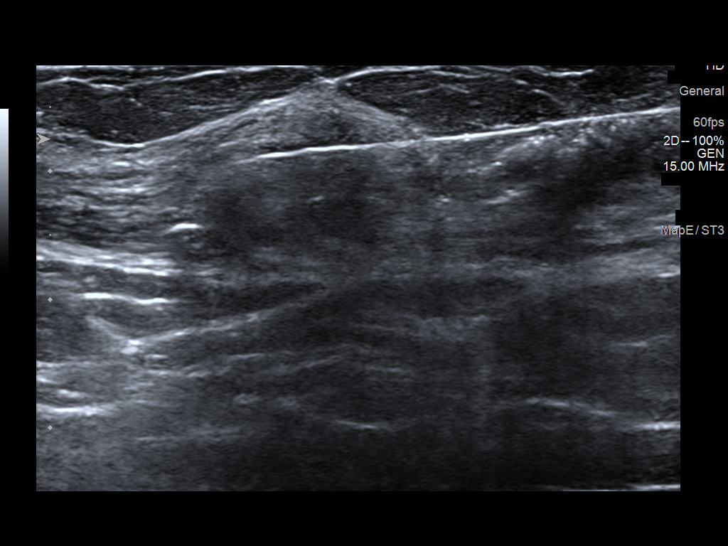
[im 10/16]
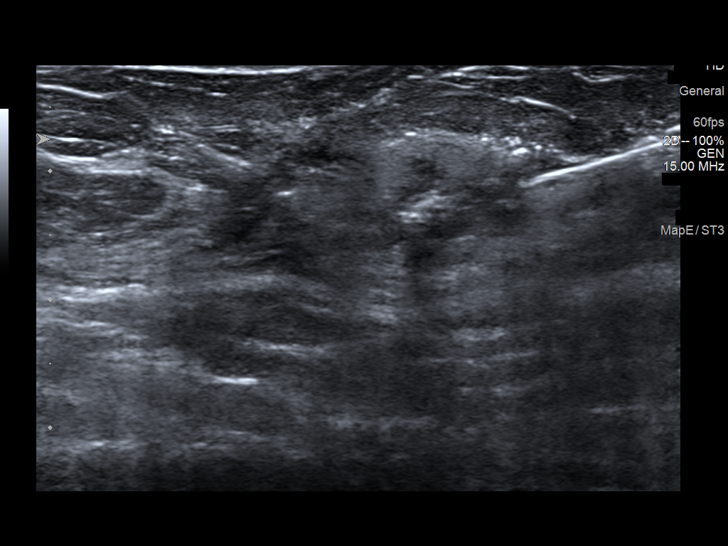
[im 11/16]
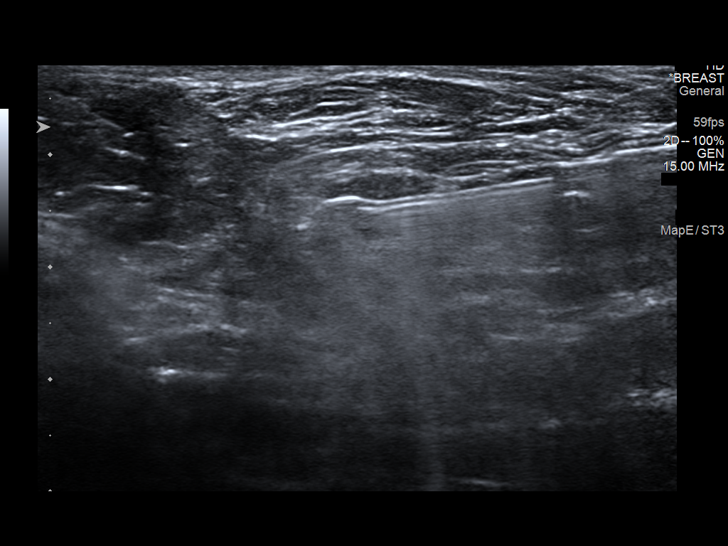
[im 12/16]
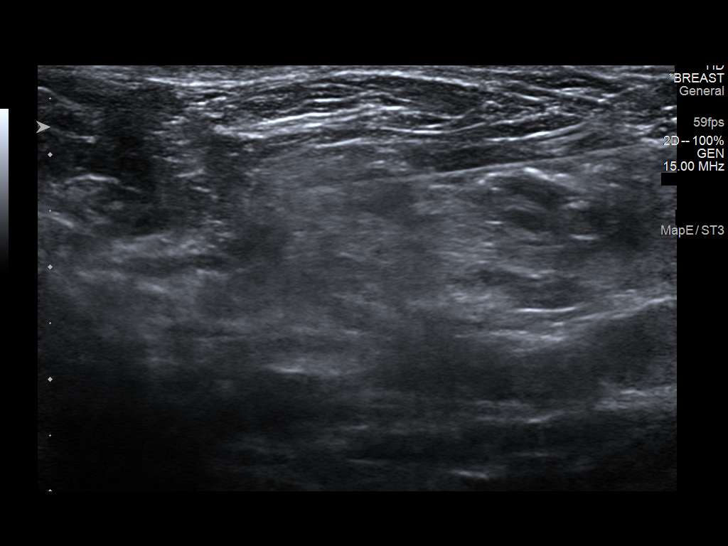
[im 13/16]
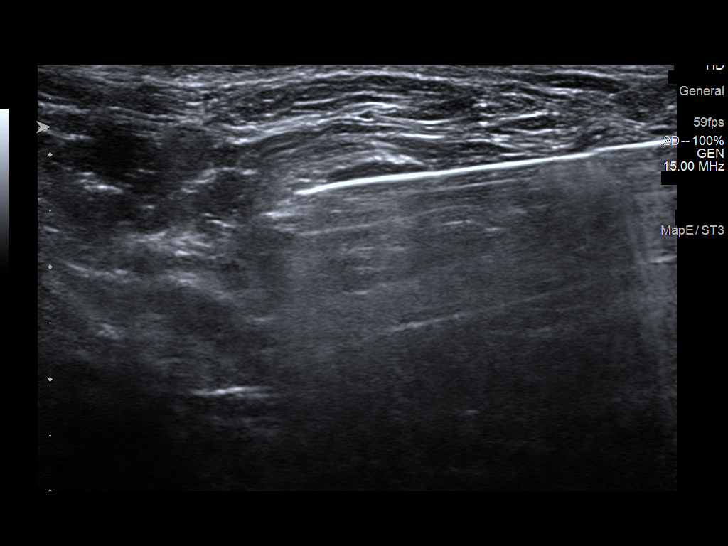
[im 15/16]
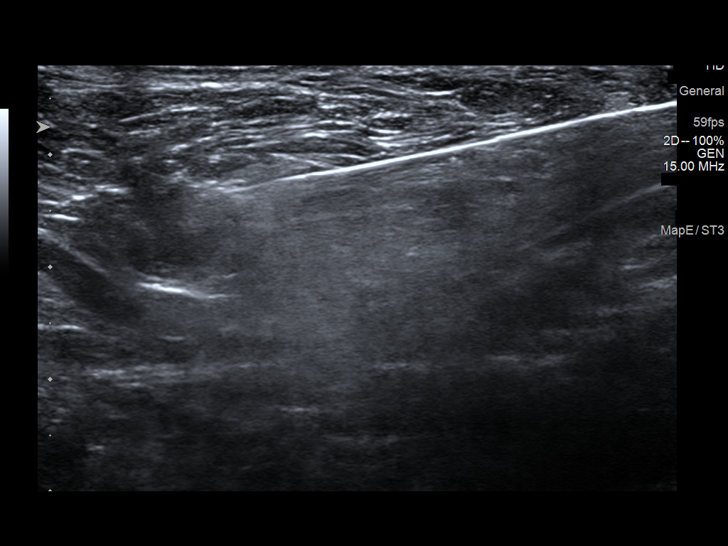
[im 16/16]
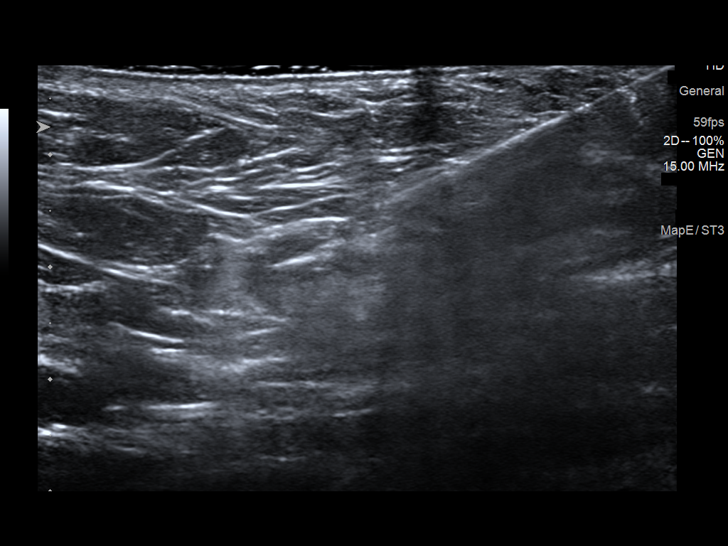

[13 of 16 positions shown; findings below may reference images not displayed]



Lesion quadrant: Upper-outer quadrant

Using sterile technique and 1% Lidocaine as local anesthetic, under
direct ultrasound visualization, a 14 gauge Jeppson device was
used to perform biopsy of a mass in the right breast at 10 o'clock,
3 cm from the nipple using an inferior approach. At the conclusion
of the procedure a ribbon shaped tissue marker clip was deployed
into the biopsy cavity.

Lesion quadrant: Right axilla

Using sterile technique and 1% Lidocaine as local anesthetic, under
direct ultrasound visualization, a 14 gauge Jeppson device was
used to perform biopsy of a right axillary lymph node using an
inferior approach. At the conclusion of the procedure a spiral
shaped tissue marker clip was deployed into the biopsy cavity.

Follow up 2 view mammogram was performed and dictated separately.
IMPRESSION: 1. Ultrasound guided biopsy of a right breast mass at 10 o'clock, 3
cm from the nipple (ribbon clip). No apparent complications.

2. Ultrasound-guided biopsy of a right axillary lymph node (spiral
clip). No apparent complications.

## 2020-05-14 IMAGING — MG STEREOTACTIC VACUUM ASSIST RIGHT
8 of 9 series · 8 of 17 positions shown · non-contrast
Comparison: Previous exams.

ADDENDUM:
Pathology revealed INTERMEDIATE GRADE DUCTAL CARCINOMA IN SITU WITH
CALCIFICATIONS OF RIGHT breast, upper outer quadrant, coil clip.
This was found to be concordant by Dr. Reywo Doank.

Pathology revealed GRADE II-III INVASIVE DUCTAL CARCINOMA, DUCTAL
CARCINOMA IN SITU of RIGHT breast, 10 o'clock, ribbon clip. This was
found to be concordant by Dr. Reywo Doank.
Pathology revealed THERE IS NO EVIDENCE OF CARCINOMA IN 1 OF 1 LYMPH
NODE of RIGHT axilla, spiral clip. This was found to be concordant
by Dr. Reywo Doank.
Pathology results were discussed with the patient by telephone. The
patient reported doing well after the biopsy with tenderness at the
site. Post biopsy instructions and care were reviewed and questions
were answered. The patient asked that I contact her Firmin, Rocard
Johan Olav, to also discuss her pathology results. Pathology results and
surgical referral appointment was also discussed with Ogail, [place], per patient request. The patient and family were encouraged
to call The [REDACTED] for any additional
concerns.
Surgical consultation has been arranged with Dr. Ferienhaus Erxleben
at [REDACTED] on September 06, 2017.
Note: If breast conservation is being considered, an additional
biopsy of the mass with calcifications seen on the mammogram is
recommended.
Pathology results reported by Yoel Tiger, RN on 09/04/2017.
CLINICAL DATA: 67-year-old female presenting for stereotactic
biopsy of calcifications in the right breast.
EXAM:
RIGHT BREAST STEREOTACTIC CORE NEEDLE BIOPSY

[R (1 of 6)]
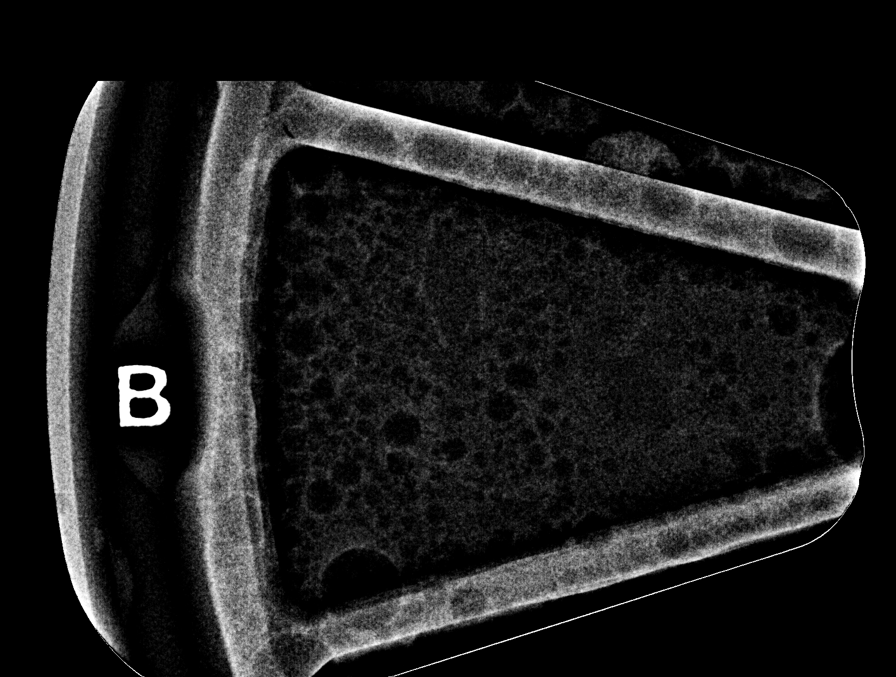

[R (2 of 6)]
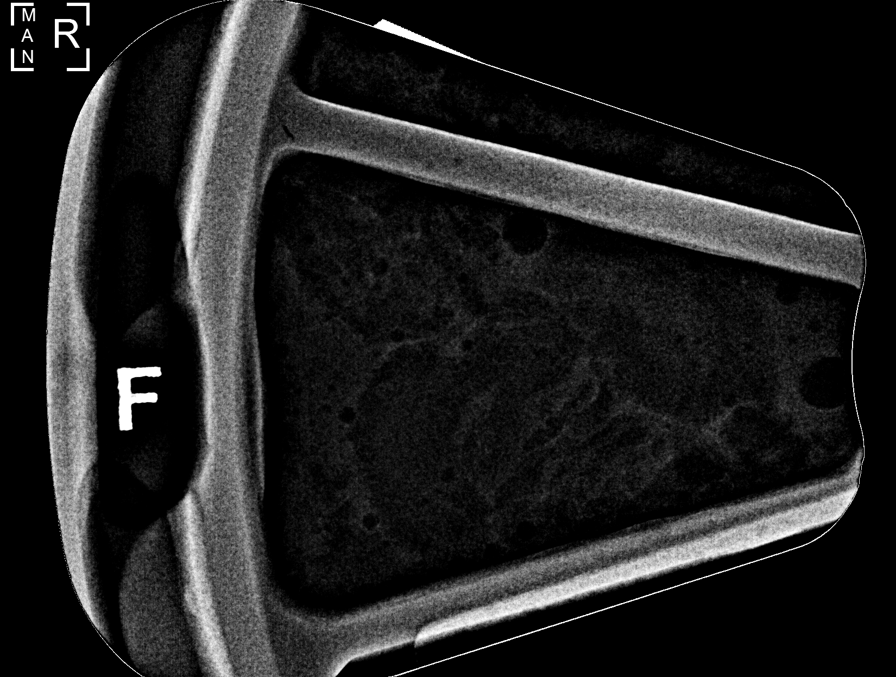

[R (3 of 6)]
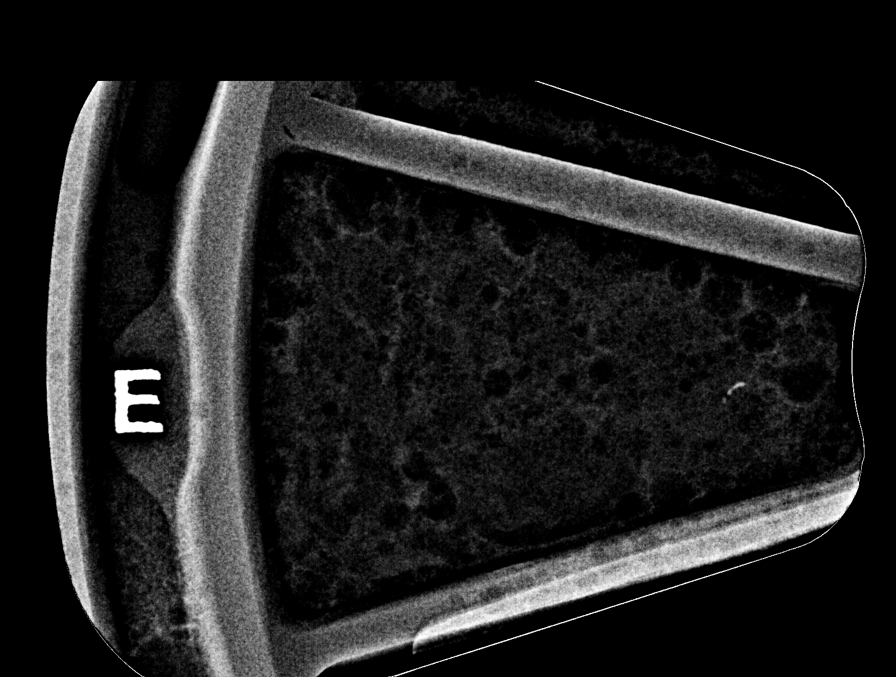

[R (4 of 6)]
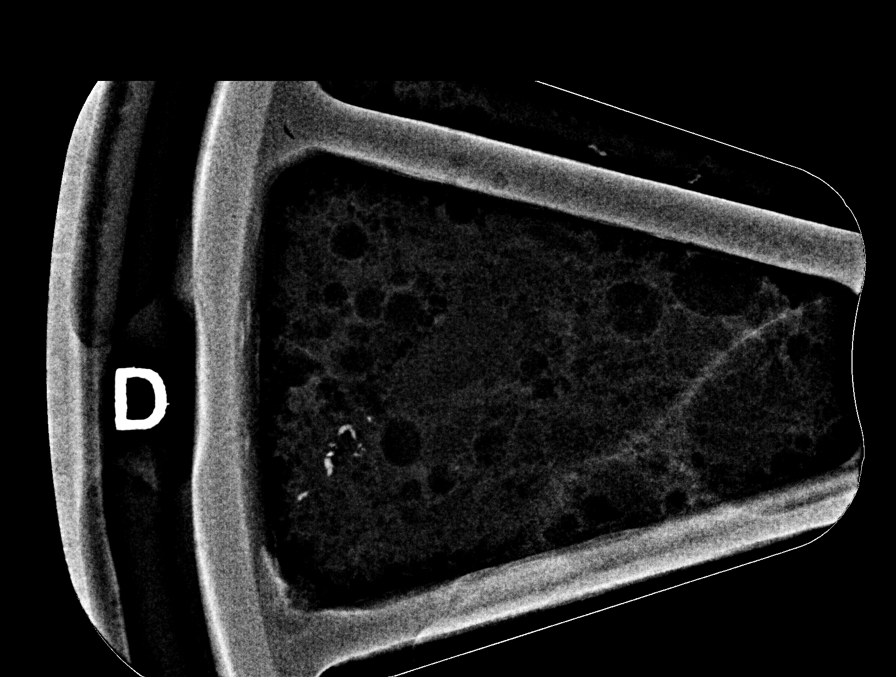

[R (5 of 6)]
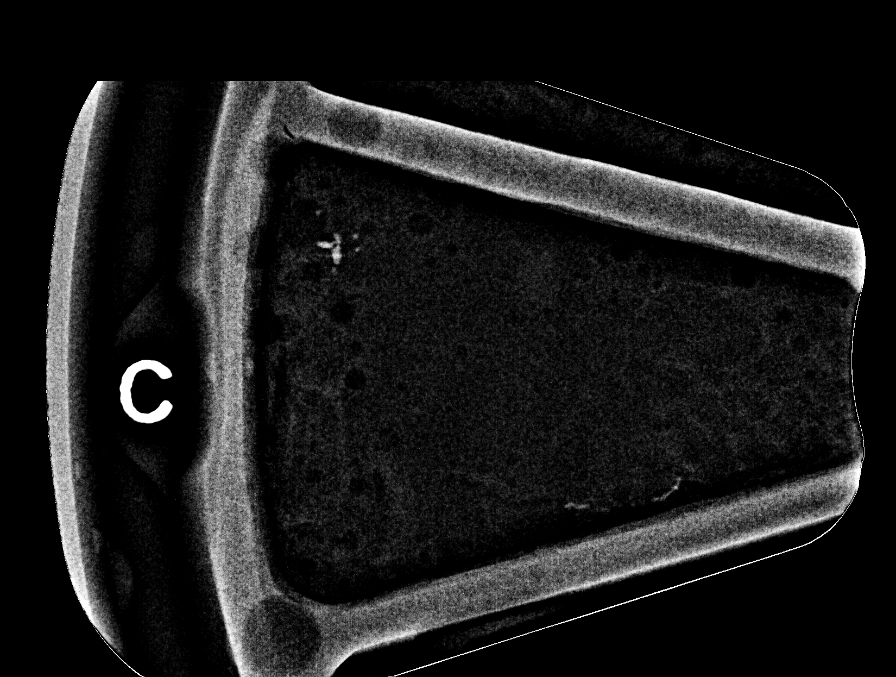

[R (6 of 6)]
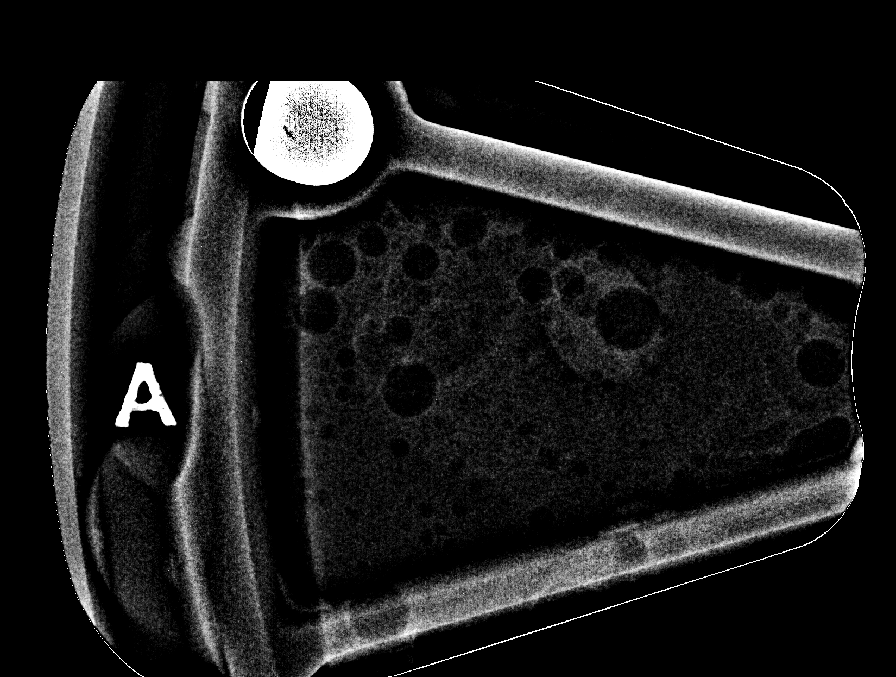

[R LM]
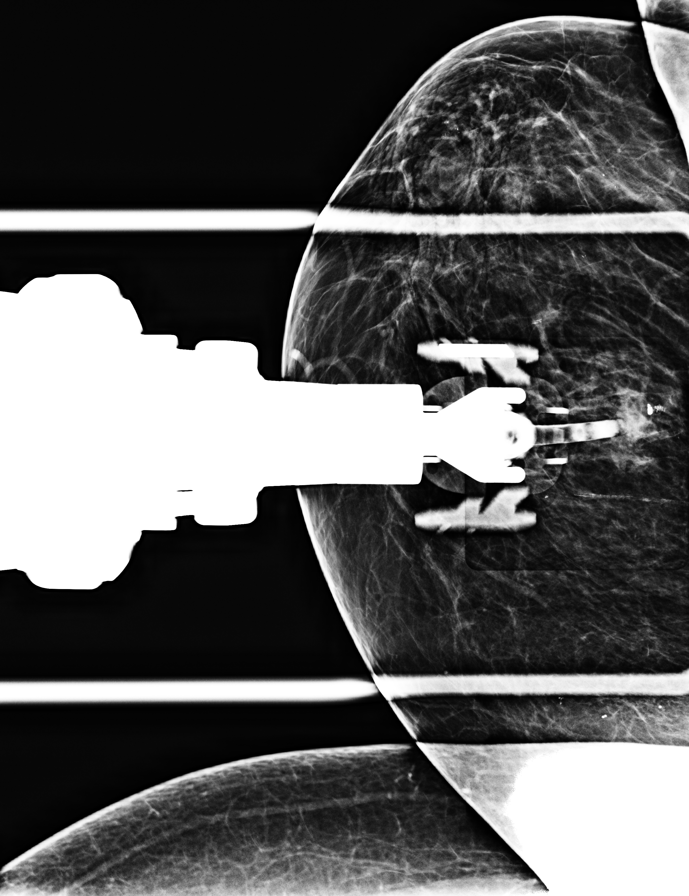

[R LM tomo · tomo slice 27/52.0]
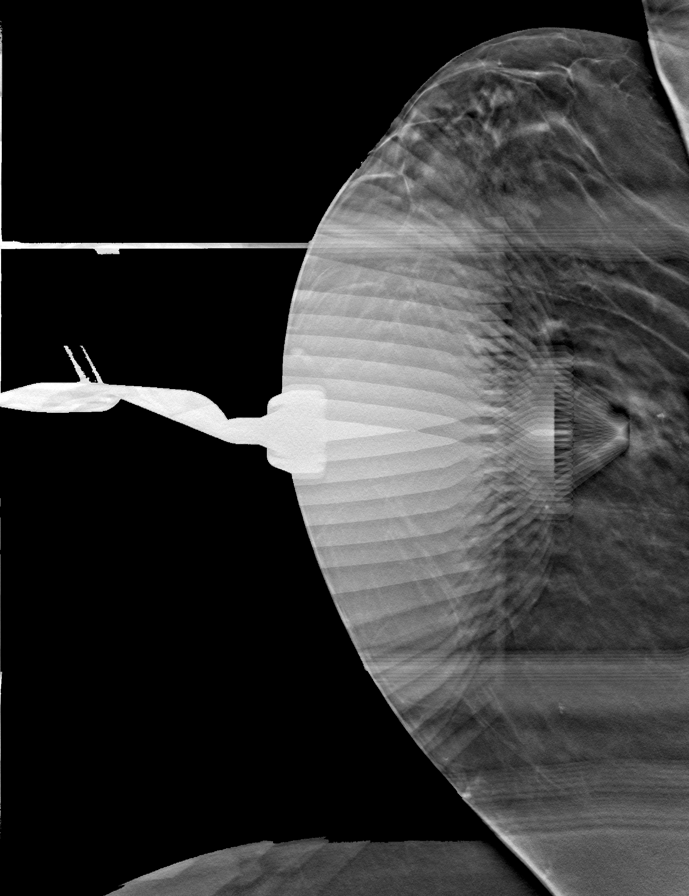

[8 of 17 positions shown; findings below may reference images not displayed]



Using sterile technique and 1% Lidocaine as local anesthetic, under
stereotactic guidance, a 9 gauge vacuum assisted device was used to
perform core needle biopsy of calcifications in the upper-outer
quadrant of the right breast using a lateral approach. Six samples
were acquired in total, and specimen radiograph was performed
showing calcifications within several core samples. Specimens with
calcifications are identified for pathology.

Lesion quadrant: Upper-outer quadrant

At the conclusion of the procedure, a coil shaped tissue marker clip
was deployed into the biopsy cavity. Follow-up 2-view mammogram was
performed and dictated separately.
IMPRESSION: Stereotactic-guided biopsy of calcifications in the upper-outer
right breast. No apparent complications.

This procedure was immediately followed by ultrasound-guided biopsy
of 2 sites in the right breast. The ultrasound-guided procedures
will be dictated in a separate report.

## 2020-05-30 ENCOUNTER — Other Ambulatory Visit: Payer: Self-pay | Admitting: *Deleted

## 2020-05-30 DIAGNOSIS — Z95828 Presence of other vascular implants and grafts: Secondary | ICD-10-CM

## 2020-05-30 DIAGNOSIS — Z170 Estrogen receptor positive status [ER+]: Secondary | ICD-10-CM

## 2020-05-30 NOTE — Progress Notes (Signed)
Received call from pt requesting port a cath to be removed considering she has completed tx. Per MD okay to arrange for removal.  Port originally placed by Dr. Excell Seltzer who has retired.  Per MD pt to have port removed at [facility] IR.  Orders placed.

## 2020-05-31 ENCOUNTER — Other Ambulatory Visit: Payer: Self-pay | Admitting: Nurse Practitioner

## 2020-05-31 DIAGNOSIS — N1832 Chronic kidney disease, stage 3b: Secondary | ICD-10-CM

## 2020-05-31 DIAGNOSIS — Z794 Long term (current) use of insulin: Secondary | ICD-10-CM

## 2020-05-31 MED ORDER — VICTOZA 18 MG/3ML ~~LOC~~ SOPN: 1.8000 mg | PEN_INJECTOR | Freq: Every day | SUBCUTANEOUS | 2 refills | Status: DC

## 2020-05-31 NOTE — Telephone Encounter (Signed)
Copied from [place] [phone]. Topic: Quick Communication - Rx Refill/Question >> May 31, 2020 12:32 PM Tessa Lerner A wrote: Medication: liraglutide (VICTOZA) 18 MG/3ML SOPN   Has the patient contacted their pharmacy? No. Patient has not contacted their pharmacy. Patient was directed to contact their pharmacy, by agent, for future refills.  Preferred Pharmacy (with phone number or street name): [place], [place] - [address].  Phone:  [phone] Fax:  [phone]  Agent: Please be advised that RX refills may take up to 3 business days. We ask that you follow-up with your pharmacy.

## 2020-06-09 LAB — HM DIABETES EYE EXAM

## 2020-06-13 ENCOUNTER — Ambulatory Visit: Payer: 59 | Admitting: Nurse Practitioner

## 2020-06-29 ENCOUNTER — Other Ambulatory Visit: Payer: Self-pay | Admitting: Student

## 2020-06-29 ENCOUNTER — Other Ambulatory Visit: Payer: Self-pay | Admitting: Nurse Practitioner

## 2020-06-29 DIAGNOSIS — I10 Essential (primary) hypertension: Secondary | ICD-10-CM

## 2020-06-29 MED ORDER — LOSARTAN POTASSIUM 25 MG PO TABS: 25.0000 mg | ORAL_TABLET | Freq: Every day | ORAL | 0 refills | Status: DC

## 2020-06-29 NOTE — Telephone Encounter (Signed)
Copied from [place] [phone]. Topic: Quick Communication - Rx Refill/Question >> Jun 29, 2020 10:39 AM Leward Quan A wrote: Medication: losartan (COZAAR) 25 MG tablet Patient refused to schedule an appointment in the future  Has the patient contacted their pharmacy? Yes.   (Agent: If no, request that the patient contact the pharmacy for the refill.) (Agent: If yes, when and what did the pharmacy advise?)  Preferred Pharmacy (with phone number or street name): [place], [place] - [address].  Phone:  [phone] Fax:  [phone]     Agent: Please be advised that RX refills may take up to 3 business days. We ask that you follow-up with your pharmacy.

## 2020-06-29 NOTE — Telephone Encounter (Signed)
Requested Prescriptions  Pending Prescriptions Disp Refills  . losartan (COZAAR) 25 MG tablet 90 tablet 0    Sig: Take 1 tablet (25 mg total) by mouth daily.     Cardiovascular:  Angiotensin Receptor Blockers Failed - 06/29/2020 12:49 PM      Failed - Cr in normal range and within 180 days    Creatinine  Date Value Ref Range Status  01/29/2019 2.25 (H) 0.44 - 1.00 mg/dL Final   Creatinine, Ser  Date Value Ref Range Status  03/15/2020 1.65 (H) 0.57 - 1.00 mg/dL Final         Passed - K in normal range and within 180 days    Potassium  Date Value Ref Range Status  03/15/2020 4.6 3.5 - 5.2 mmol/L Final         Passed - Patient is not pregnant      Passed - Last BP in normal range    BP Readings from Last 1 Encounters:  03/15/20 122/81         Passed - Valid encounter within last 6 months    Recent Outpatient Visits          3 months ago Type 2 diabetes mellitus with stage 3b chronic kidney disease, with long-term current use of insulin ([place])   [place], [place] W, NP   5 months ago Insulin dependent type 2 diabetes mellitus [facility])   [place], Annie Main L, RPH-CPP   5 months ago Insulin dependent type 2 diabetes mellitus [facility])   [place], Annie Main L, RPH-CPP   5 months ago Insulin dependent type 2 diabetes mellitus [facility])   [place], Jarome [place]atin, RPH-CPP   6 months ago Need for influenza vaccination   [place], RPH-CPP

## 2020-06-30 ENCOUNTER — Ambulatory Visit (HOSPITAL_COMMUNITY)
Admission: RE | Admit: 2020-06-30 | Discharge: 2020-06-30 | Disposition: A | Discharge status: Home / Self Care | Payer: 59 | Source: Ambulatory Visit | Attending: Hematology and Oncology | Admitting: Hematology and Oncology

## 2020-06-30 ENCOUNTER — Other Ambulatory Visit: Payer: Self-pay

## 2020-06-30 ENCOUNTER — Encounter (HOSPITAL_COMMUNITY): Payer: Self-pay

## 2020-06-30 DIAGNOSIS — F17210 Nicotine dependence, cigarettes, uncomplicated: Secondary | ICD-10-CM | POA: Insufficient documentation

## 2020-06-30 DIAGNOSIS — E785 Hyperlipidemia, unspecified: Secondary | ICD-10-CM | POA: Diagnosis not present

## 2020-06-30 DIAGNOSIS — Z794 Long term (current) use of insulin: Secondary | ICD-10-CM | POA: Diagnosis not present

## 2020-06-30 DIAGNOSIS — Z9221 Personal history of antineoplastic chemotherapy: Secondary | ICD-10-CM | POA: Insufficient documentation

## 2020-06-30 DIAGNOSIS — I10 Essential (primary) hypertension: Secondary | ICD-10-CM | POA: Insufficient documentation

## 2020-06-30 DIAGNOSIS — Z7982 Long term (current) use of aspirin: Secondary | ICD-10-CM | POA: Diagnosis not present

## 2020-06-30 DIAGNOSIS — Z79899 Other long term (current) drug therapy: Secondary | ICD-10-CM | POA: Diagnosis not present

## 2020-06-30 DIAGNOSIS — Z853 Personal history of malignant neoplasm of breast: Secondary | ICD-10-CM | POA: Insufficient documentation

## 2020-06-30 DIAGNOSIS — I2510 Atherosclerotic heart disease of native coronary artery without angina pectoris: Secondary | ICD-10-CM | POA: Insufficient documentation

## 2020-06-30 DIAGNOSIS — Z170 Estrogen receptor positive status [ER+]: Secondary | ICD-10-CM

## 2020-06-30 DIAGNOSIS — Z95828 Presence of other vascular implants and grafts: Secondary | ICD-10-CM

## 2020-06-30 DIAGNOSIS — Z452 Encounter for adjustment and management of vascular access device: Secondary | ICD-10-CM | POA: Insufficient documentation

## 2020-06-30 DIAGNOSIS — E119 Type 2 diabetes mellitus without complications: Secondary | ICD-10-CM | POA: Insufficient documentation

## 2020-06-30 HISTORY — PX: IR REMOVAL TUN ACCESS W/ PORT W/O FL MOD SED: IMG2290

## 2020-06-30 LAB — GLUCOSE, CAPILLARY: Glucose-Capillary: 104 mg/dL — ABNORMAL HIGH (ref 70–99)

## 2020-06-30 MED ORDER — MIDAZOLAM HCL 2 MG/2ML IJ SOLN
INTRAMUSCULAR | Status: AC
  Filled 2020-06-30: qty 2

## 2020-06-30 MED ORDER — SODIUM CHLORIDE 0.9 % IV SOLN: INTRAVENOUS | Status: DC

## 2020-06-30 MED ORDER — LIDOCAINE-EPINEPHRINE 1 %-1:100000 IJ SOLN
INTRAMUSCULAR | Status: AC
  Filled 2020-06-30: qty 1

## 2020-06-30 MED ORDER — LIDOCAINE-EPINEPHRINE 1 %-1:100000 IJ SOLN
INTRAMUSCULAR | Status: AC | PRN
  Administered 2020-06-30: 10 mL via INTRADERMAL

## 2020-06-30 MED ORDER — MIDAZOLAM HCL 2 MG/2ML IJ SOLN
INTRAMUSCULAR | Status: AC | PRN
  Administered 2020-06-30 (×2): 1 mg via INTRAVENOUS

## 2020-06-30 MED ORDER — FENTANYL CITRATE (PF) 100 MCG/2ML IJ SOLN
INTRAMUSCULAR | Status: AC | PRN
  Administered 2020-06-30 (×2): 50 ug via INTRAVENOUS

## 2020-06-30 MED ORDER — FENTANYL CITRATE (PF) 100 MCG/2ML IJ SOLN
INTRAMUSCULAR | Status: AC
  Filled 2020-06-30: qty 2

## 2020-06-30 NOTE — Procedures (Signed)
Pre Procedural Dx: Poor venous access Post Procedural Dx: Same  Successful removal of anterior chest wall port-a-cath.  EBL: Trace No immediate post procedural complications.   Jay Octavion Mollenkopf, MD Pager #: [phone]  

## 2020-06-30 NOTE — H&P (Signed)
Chief Complaint: Patient was seen in consultation today for breast cancer/Port-a-cath removal.  Referring Physician(s): Nicholas Lose (oncology)  Supervising Physician: Sandi Mariscal  Patient Status: [facility] - Out-pt  History of Present Illness: [name] is a 70 y.o. female with a past medical history of hypertension, hyperlipidemia, CAD s/p 6 stents, MI, angina, headaches, asthma, COPD, cholelithiasis, GERD, non-alcoholic fatty liver disease, diabetes mellitus type II, iron deficiency anemia, breast cancer, CTS, and cocaine abuse. She was unfortunately diagnosed with right breast cancer in 08/2017. Her cancer is managed by Dr. Lindi Adie. She underwent a right mastectomy and had a Port-a-cath placed in OR 11/27/2017 by Dr. Excell Seltzer. She has completed systemic chemotherapy at this time, and requests her Port-a-cath be removed secondary to intermittent pain of Port-a-cath.  IR consulted by Dr. Lindi Adie for possible Port-a-cath removal. Patient awake and alert sitting in bed. Complains of cough, states she has had a "cold" for about a month. Afebrile, denies dyspnea. States she had "chest pain last night because my son got me upset" and had to take Nitro. States this happens approximately 1x/week when "my son comes around". Currently denies chest pain, BP stable. Denies fever, chills, current chest pain, dyspnea, abdominal pain, or headache.   Past Medical History:  Diagnosis Date  . Ambulates with cane    straight  . Angina    no current problems per patient at PAT appt 11/05/18, nitroglycerin sl 06/30/20  . Arthritis    HANDS"  . Asthma   . Cancer ([place]) 09/04/2017   Right Breast  . Carpal tunnel syndrome, bilateral 11/26/2016  . Cholelithiasis   . Cocaine abuse ([place]) 07/2012   per E.R. drug screen  . COPD (chronic obstructive pulmonary disease) ([place])    per patient  . Coronary artery disease   . Diabetes mellitus without mention of complication    type 2  . Dysphagia    esophageal  dysmotility on 03/2011 esophagram   . Dyspnea    with exertion  . Fatty liver disease, nonalcoholic 4166   on Imaging.   Marland Kitchen GERD (gastroesophageal reflux disease)   . Headache   . Hearing loss    bilateral - no hearing aids  . History of colonic polyps 2009   adenomatous 2009, HP in 2009, 2011, 2013.   Marland Kitchen Hyperlipidemia   . Hypertension   . Iron deficiency anemia   . Myocardial infarction ([place])    years ago, 6 stents placed  . 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 Wears dentures    full upper and partial lower  . Wears glasses     Past Surgical History:  Procedure Laterality Date  . ABDOMINAL ANGIOGRAM  02/20/2011   Procedure: ABDOMINAL ANGIOGRAM;  Surgeon: Clent Demark, MD;  Location: [facility] CATH LAB;  Service: Cardiovascular;;  . BREAST BIOPSY Left   . CARPAL TUNNEL RELEASE Right 07/04/2017   Procedure: RIGHT CARPAL TUNNEL RELEASE;  Surgeon: Leanora Cover, MD;  Location: [place];  Service: Orthopedics;  Laterality: Right;  . CARPAL TUNNEL RELEASE Left 09/12/2017   Procedure: LEFT CARPAL TUNNEL RELEASE;  Surgeon: Leanora Cover, MD;  Location: [place];  Service: Orthopedics;  Laterality: Left;  . CHOLECYSTECTOMY N/A 11/11/2018   Procedure: ATTEMPTED LAPAROSCOPIC CHOLECUSTECOMY,  OPEN CHOLECYSTECTOMY;  Surgeon: Coralie Keens, MD;  Location: [place];  Service: General;  Laterality: N/A;  . COLONOSCOPY  11/30/2011   Procedure: COLONOSCOPY;  Surgeon: Lafayette Dragon, MD;  Location: [facility] ENDOSCOPY;  Service: Endoscopy;  Laterality:  N/A;  . CORONARY ANGIOPLASTY WITH STENT PLACEMENT  02/20/2011  . ERCP N/A 10/03/2018   Procedure: ENDOSCOPIC RETROGRADE CHOLANGIOPANCREATOGRAPHY (ERCP);  Surgeon: Ladene Artist, MD;  Location: [facility];  Service: Endoscopy;  Laterality: N/A;  . ESOPHAGOGASTRODUODENOSCOPY  11/30/2011   Procedure: ESOPHAGOGASTRODUODENOSCOPY (EGD);  Surgeon: Lafayette Dragon, MD;  Location: [facility];  Service: Endoscopy;  Laterality:  N/A;  . ESOPHAGOGASTRODUODENOSCOPY N/A 02/23/2015   Procedure: ESOPHAGOGASTRODUODENOSCOPY (EGD);  Surgeon: Gatha Mayer, MD;  Location: [facility] ENDOSCOPY;  Service: Endoscopy;  Laterality: N/A;  . EYE SURGERY Bilateral    cataracts removed  . INTRAOPERATIVE CHOLANGIOGRAM N/A 11/11/2018   Procedure: Intraoperative Cholangiogram;  Surgeon: Coralie Keens, MD;  Location: [place];  Service: General;  Laterality: N/A;  . LEFT HEART CATHETERIZATION WITH CORONARY ANGIOGRAM N/A 02/20/2011   Procedure: LEFT HEART CATHETERIZATION WITH CORONARY ANGIOGRAM;  Surgeon: Clent Demark, MD;  Location: [place] CATH LAB;  Service: Cardiovascular;  Laterality: N/A;  . MASTECTOMY Right 11/05/2017  . MASTECTOMY W/ SENTINEL NODE BIOPSY Right 11/27/2017  . MASTECTOMY W/ SENTINEL NODE BIOPSY Right 11/27/2017   Procedure: RIGHT TOTAL MASTECTOMY WITH SENTINEL LYMPH NODE BIOPSY;  Surgeon: Excell Seltzer, MD;  Location: [place];  Service: General;  Laterality: Right;  . PORT A CATH REVISION Left 05/07/2018   Procedure: PORT A CATH REVISION;  Surgeon: Excell Seltzer, MD;  Location: [facility]S;  Service: General;  Laterality: Left;  . PORTACATH PLACEMENT Left 11/27/2017   Procedure: INSERTION PORT-A-CATH;  Surgeon: Excell Seltzer, MD;  Location: [place];  Service: General;  Laterality: Left;  . RIGHT COLECTOMY  02/2007   for post polypectomy colonic perforation  . SPHINCTEROTOMY  10/03/2018   Procedure: SPHINCTEROTOMY;  Surgeon: Ladene Artist, MD;  Location: [facility];  Service: Endoscopy;;  . TUBAL LIGATION      Allergies: Patient has no known allergies.  Medications: Prior to Admission medications   Medication Sig Start Date End Date Taking? Authorizing Provider  albuterol (PROVENTIL) (2.5 MG/3ML) 0.083% nebulizer solution Take 2.5 mg by nebulization every 6 (six) hours as needed for wheezing or shortness of breath.   Yes [provider]  ALPRAZolam (XANAX) 0.25 MG tablet Take 0.25 mg by mouth at bedtime.    Yes [provider]  amLODipine (NORVASC) 5 MG tablet Take 1 tablet (5 mg total) by mouth daily. 03/15/20  Yes Gildardo Pounds, NP  losartan (COZAAR) 25 MG tablet Take 1 tablet (25 mg total) by mouth daily. 06/29/20 09/27/20 Yes Gildardo Pounds, NP  metoprolol succinate (TOPROL-XL) 50 MG 24 hr tablet Take 1 tablet (50 mg total) by mouth daily. Take with or immediately following a meal. 03/15/20  Yes Gildardo Pounds, NP  Multiple Vitamin (MULTIVITAMIN WITH MINERALS) TABS tablet Take 1 tablet by mouth daily. [place]   Yes [provider]  nitroGLYCERIN (NITROSTAT) 0.4 MG SL tablet Place 1 tablet (0.4 mg total) under the tongue every 5 (five) minutes as needed for chest pain. 03/15/20  Yes Gildardo Pounds, NP  Omega-3 Fatty Acids (FISH OIL) 1000 MG CAPS Take 1,000 mg by mouth daily.   Yes [provider]  anastrozole (ARIMIDEX) 1 MG tablet TAKE ONE TABLET BY MOUTH DAILY 01/19/20   Nicholas Lose, MD  aspirin 81 MG EC tablet Take 1 tablet (81 mg total) by mouth daily. 10/13/18   Pokhrel, Corrie Mckusick, MD  atorvastatin (LIPITOR) 20 MG tablet Take 1 tablet (20 mg total) by mouth daily at 6 PM. 03/15/20 06/13/20  Gildardo Pounds, NP  budesonide-formoterol (SYMBICORT) 160-4.5 MCG/ACT inhaler Inhale 1 puff into the lungs 2 (two) times daily as needed (respiratory issues.). 03/15/20   Gildardo Pounds, NP  Insulin Pen Needle (TRUEPLUS PEN NEEDLES) 31G X 8 MM MISC Inject into the skin once nightly.  E11.65 03/31/20   Gildardo Pounds, NP  LANTUS SOLOSTAR 100 UNIT/ML Solostar Pen Inject 5 Units into the skin at bedtime. E11.65 03/15/20 06/13/20  Gildardo Pounds, NP  liraglutide (VICTOZA) 18 MG/3ML SOPN Inject 1.8 mg into the skin daily. 05/31/20   Gildardo Pounds, NP  Misc. Devices MISC Please provide patient with nebulizer mask and tubing. [id number] 12/14/19   Gildardo Pounds, NP  pantoprazole (PROTONIX) 40 MG tablet Take 1 tablet (40 mg total) by mouth daily. 03/15/20 06/13/20  Gildardo Pounds, NP  traMADol  (ULTRAM) 50 MG tablet Take 1 tablet (50 mg total) by mouth every 6 (six) hours as needed for moderate pain. 11/18/18   Kalman Drape, PA     Family History  Problem Relation Age of Onset  . Heart disease Mother   . Diabetes Mother   . Cancer Father        unsure what kind  . Diabetes Sister   . Colon cancer Neg Hx   . Breast cancer Neg Hx     Social History   Socioeconomic History  . Marital status: Divorced    Spouse name: Not on file  . Number of children: 2  . Years of education: Not on file  . Highest education level: Not on file  Occupational History  . Occupation: retired. prev worked in housekeeping w/ [employer]  Tobacco Use  . Smoking status: Current Every Day Smoker    Packs/day: 0.50    Years: 47.00    Pack years: 23.50    Types: Cigarettes    Last attempt to quit: 05/09/2010    Years since quitting: 10.1  . Smokeless tobacco: Never Used  Vaping Use  . Vaping Use: Never used  Substance and Sexual Activity  . Alcohol use: Yes  . Drug use: Not Currently    Types: Cocaine    Comment: positive drug screen (cocaine, benzo's)07/2012 in emergency room  . Sexual activity: Not Currently    Birth control/protection: Post-menopausal  Other Topics Concern  . Not on file  Social History Narrative   Caffeine use daily.   Social Determinants of Health   Financial Resource Strain: Not on file  Food Insecurity: Not on file  Transportation Needs: Not on file  Physical Activity: Not on file  Stress: Not on file  Social Connections: Not on file     Review of Systems: A 12 point ROS discussed and pertinent positives are indicated in the HPI above.  All other systems are negative.  Review of Systems  Constitutional: Negative for chills and fever.  Respiratory: Positive for cough. Negative for shortness of breath and wheezing.   Cardiovascular: Negative for chest pain and palpitations.  Gastrointestinal: Negative for abdominal pain.  Neurological: Negative for  headaches.  Psychiatric/Behavioral: Negative for behavioral problems and confusion.    Vital Signs: BP 135/61   Pulse 75   Temp 98.4 F (36.9 C) (Oral)   Resp 20   LMP  (LMP Unknown) Comment: tubal ligation  SpO2 97%   Physical Exam Vitals and nursing note reviewed.  Constitutional:      General: She is not in acute distress.    Appearance: She is obese.  Cardiovascular:  Rate and Rhythm: Normal rate and regular rhythm.     Heart sounds: Normal heart sounds. No murmur heard.   Pulmonary:     Effort: Pulmonary effort is normal. No respiratory distress.     Breath sounds: Normal breath sounds. No wheezing.  Skin:    General: Skin is warm and dry.     Comments: Left chest at site of Port-a-cath with will healed incision, no erythema, drainage, edema, or active bleeding noted.  Neurological:     Mental Status: She is alert and oriented to person, place, and time.      MD Evaluation Airway: WNL Heart: WNL Abdomen: WNL Chest/ Lungs: WNL ASA  Classification: 3 Mallampati/Airway Score: Two   Imaging: No results found.  Labs:  CBC: Recent Labs    03/15/20 1154  WBC 5.4  HGB 14.0  HCT 41.9  PLT 283    COAGS: No results for input(s): INR, APTT in the last 8760 hours.  BMP: Recent Labs    09/11/19 1009 03/15/20 1154  NA 138 142  K 5.1 4.6  CL 99 103  CO2 23 21  GLUCOSE 171* 92  BUN 21 16  CALCIUM 10.4* 10.7*  CREATININE 1.62* 1.65*  GFRNONAA 32* 31*  GFRAA 37* 36*    LIVER FUNCTION TESTS: Recent Labs    09/11/19 1009 03/15/20 1154  BILITOT 0.6 0.5  AST 22 12  ALT 18 13  ALKPHOS 140* 111  PROT 8.4 8.3  ALBUMIN 4.5 4.5     Assessment and Plan:  History of breast cancer s/p systemic chemotherapy as management via left IJ Port-a-cath placed in OR 11/27/2017 by Dr. Excell Seltzer; who has completed treatment at this time. Plan for Port-a-cath removal today in IR. Patient is NPO. Afebrile.  Risks and benefits of Port-a-catheter removal  were discussed with the patient including, but not limited to bleeding or infection. All of the patient's questions were answered, patient is agreeable to proceed. Consent signed and in chart.   Thank you for this interesting consult.  I greatly enjoyed meeting [name] and look forward to participating in their care.  A copy of this report was sent to the requesting provider on this date.  Electronically Signed: Earley Abide, PA-C 06/30/2020, 8:59 AM   I spent a total of 15 Minutes in face to face in clinical consultation, greater than 50% of which was counseling/coordinating care for breast cancer/Port-a-cath removal.

## 2020-06-30 NOTE — Discharge Instructions (Signed)
Urgent needs - Interventional Radiology on call MD [phone]  Wound - May remove dressing and shower in 24 to 48 hours.  Keep site clean and dry.  Replace with bandaid as needed.  Do not submerge in tub or water until site healing well. If closed with glue, glue will flake off on its own.  Implanted Port Removal, Care After This sheet gives you information about how to care for yourself after your procedure. Your health care provider may also give you more specific instructions. If you have problems or questions, contact your health care provider. What can I expect after the procedure? After the procedure, it is common to have:  Soreness or pain near your incision.  Some swelling or bruising near your incision. Follow these instructions at home: Medicines  Take over-the-counter and prescription medicines only as told by your health care provider.  If you were prescribed an antibiotic medicine, take it as told by your health care provider. Do not stop taking the antibiotic even if you start to feel better. Bathing  Do not take baths, swim, or use a hot tub until your health care provider approves. Ask your health care provider if you can take showers. You may only be allowed to take sponge baths. Incision care  Follow instructions from your health care provider about how to take care of your incision. Make sure you: ? Wash your hands with soap and water before you change your bandage (dressing). If soap and water are not available, use hand sanitizer. ? Change your dressing as told by your health care provider. ? Keep your dressing dry. ? Leave stitches (sutures), skin glue, or adhesive strips in place. These skin closures may need to stay in place for 2 weeks or longer. If adhesive strip edges start to loosen and curl up, you may trim the loose edges. Do not remove adhesive strips completely unless your health care provider tells you to do that.  Check your incision area every day for  signs of infection. Check for: ? More redness, swelling, or pain. ? More fluid or blood. ? Warmth. ? Pus or a bad smell.   Driving  Do not drive for 24 hours if you were given a medicine to help you relax (sedative) during your procedure.  If you did not receive a sedative, ask your health care provider when it is safe to drive.   Activity  Return to your normal activities as told by your health care provider. Ask your health care provider what activities are safe for you.  Do not lift anything that is heavier than 10 lb (4.5 kg), or the limit that you are told, until your health care provider says that it is safe.  Do not do activities that involve lifting your arms over your head. General instructions  Do not use any products that contain nicotine or tobacco, such as cigarettes and e-cigarettes. These can delay healing. If you need help quitting, ask your health care provider.  Keep all follow-up visits as told by your health care provider. This is important. Contact a health care provider if:  You have more redness, swelling, or pain around your incision.  You have more fluid or blood coming from your incision.  Your incision feels warm to the touch.  You have pus or a bad smell coming from your incision.  You have pain that is not relieved by your pain medicine. Get help right away if you have:  A fever or chills.    Chest pain.  Difficulty breathing. Summary  After the procedure, it is common to have pain, soreness, swelling, or bruising near your incision.  If you were prescribed an antibiotic medicine, take it as told by your health care provider. Do not stop taking the antibiotic even if you start to feel better.  Do not drive for 24 hours if you were given a sedative during your procedure.  Return to your normal activities as told by your health care provider. Ask your health care provider what activities are safe for you. This information is not intended to  replace advice given to you by your health care provider. Make sure you discuss any questions you have with your health care provider. Document Revised: 03/07/2017 Document Reviewed: 03/07/2017 Elsevier Patient Education  2021 Elsevier Inc.   Moderate Conscious Sedation, Adult, Care After This sheet gives you information about how to care for yourself after your procedure. Your health care provider may also give you more specific instructions. If you have problems or questions, contact your health care provider. What can I expect after the procedure? After the procedure, it is common to have:  Sleepiness for several hours.  Impaired judgment for several hours.  Difficulty with balance.  Vomiting if you eat too soon. Follow these instructions at home: For the time period you were told by your health care provider:  Rest.  Do not participate in activities where you could fall or become injured.  Do not drive or use machinery.  Do not drink alcohol.  Do not take sleeping pills or medicines that cause drowsiness.  Do not make important decisions or sign legal documents.  Do not take care of children on your own.      Eating and drinking  Follow the diet recommended by your health care provider.  Drink enough fluid to keep your urine pale yellow.  If you vomit: ? Drink water, juice, or soup when you can drink without vomiting. ? Make sure you have little or no nausea before eating solid foods.   General instructions  Take over-the-counter and prescription medicines only as told by your health care provider.  Have a responsible adult stay with you for the time you are told. It is important to have someone help care for you until you are awake and alert.  Do not smoke.  Keep all follow-up visits as told by your health care provider. This is important. Contact a health care provider if:  You are still sleepy or having trouble with balance after 24 hours.  You feel  light-headed.  You keep feeling nauseous or you keep vomiting.  You develop a rash.  You have a fever.  You have redness or swelling around the IV site. Get help right away if:  You have trouble breathing.  You have new-onset confusion at home. Summary  After the procedure, it is common to feel sleepy, have impaired judgment, or feel nauseous if you eat too soon.  Rest after you get home. Know the things you should not do after the procedure.  Follow the diet recommended by your health care provider and drink enough fluid to keep your urine pale yellow.  Get help right away if you have trouble breathing or new-onset confusion at home. This information is not intended to replace advice given to you by your health care provider. Make sure you discuss any questions you have with your health care provider. Document Revised: 05/22/2019 Document Reviewed: 12/18/2018 Elsevier Patient Education  2021 Elsevier   Inc.

## 2020-07-16 IMAGING — DX DG CHEST 2V
2 series · 2 of 2 positions shown · non-contrast
Comparison: PA and lateral chest x-ray June 06, 2014

CLINICAL DATA: Preoperative examination prior to breast surgery.
History of COPD. Former smoker.

EXAM:
CHEST - 2 VIEW

[chest pa]
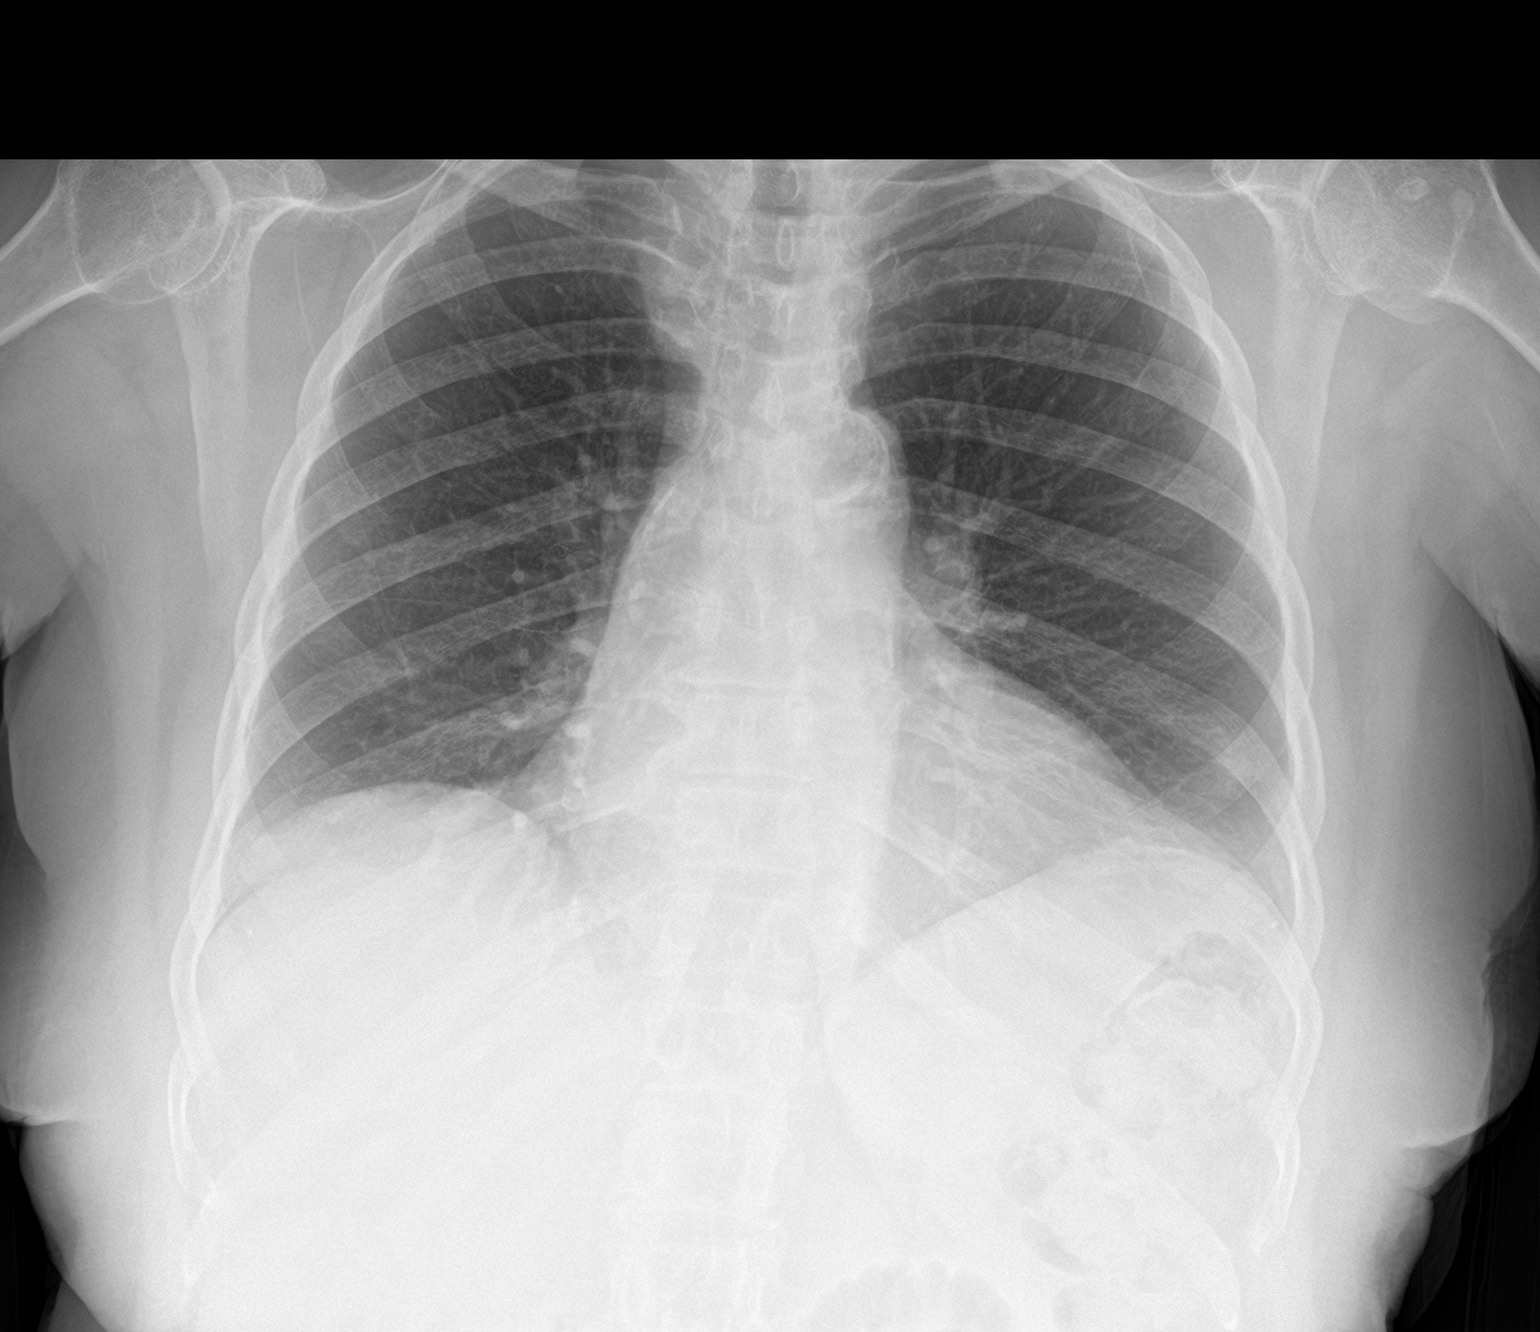

[chest lat]
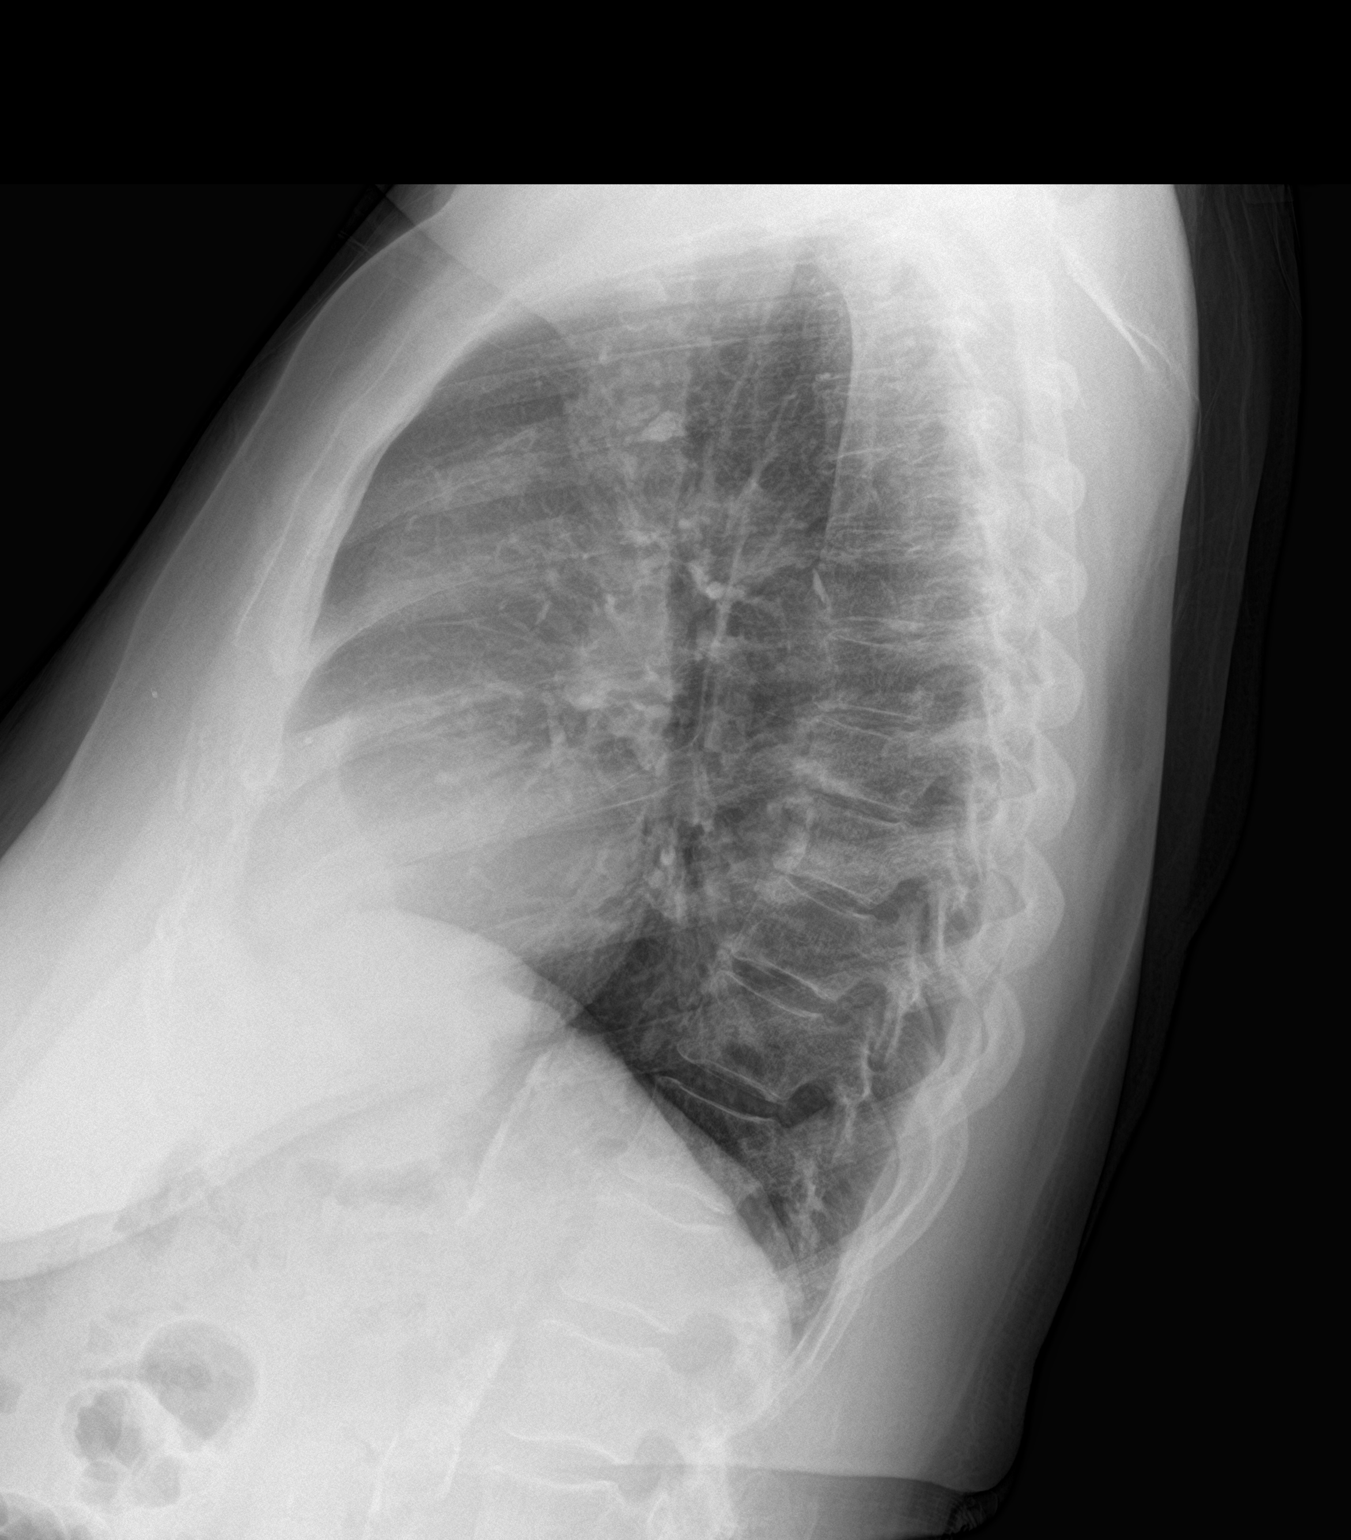

[2 of 2 positions shown; findings below may reference images not displayed]

FINDINGS: The lungs are adequately inflated. There is no focal infiltrate.
There is no pleural effusion. The heart and pulmonary vascularity
are normal. There is calcification in the wall of the thoracic
aorta. The bony thorax exhibits no acute abnormality.
IMPRESSION: There is no active cardiopulmonary disease.

Thoracic aortic atherosclerosis.

## 2020-08-03 ENCOUNTER — Telehealth: Payer: Self-pay | Admitting: Nurse Practitioner

## 2020-08-03 NOTE — Telephone Encounter (Signed)
Copied from [place] [id number]. Topic: Referral - Status >> Aug 03, 2020 12:39 PM Yvette Rack wrote: Reason for CRM: Pt stated she received a call from the specialist office and she needs to speak with [name]. Pt requests call back. Cb#  [phone]

## 2020-08-04 NOTE — Telephone Encounter (Signed)
Will route to PCP 

## 2020-08-07 IMAGING — DX DG CHEST 1V PORT
1 series · 1 of 1 positions shown · non-contrast
Comparison: 11/05/2017

CLINICAL DATA: Postop.

EXAM:
PORTABLE CHEST 1 VIEW

[chest]
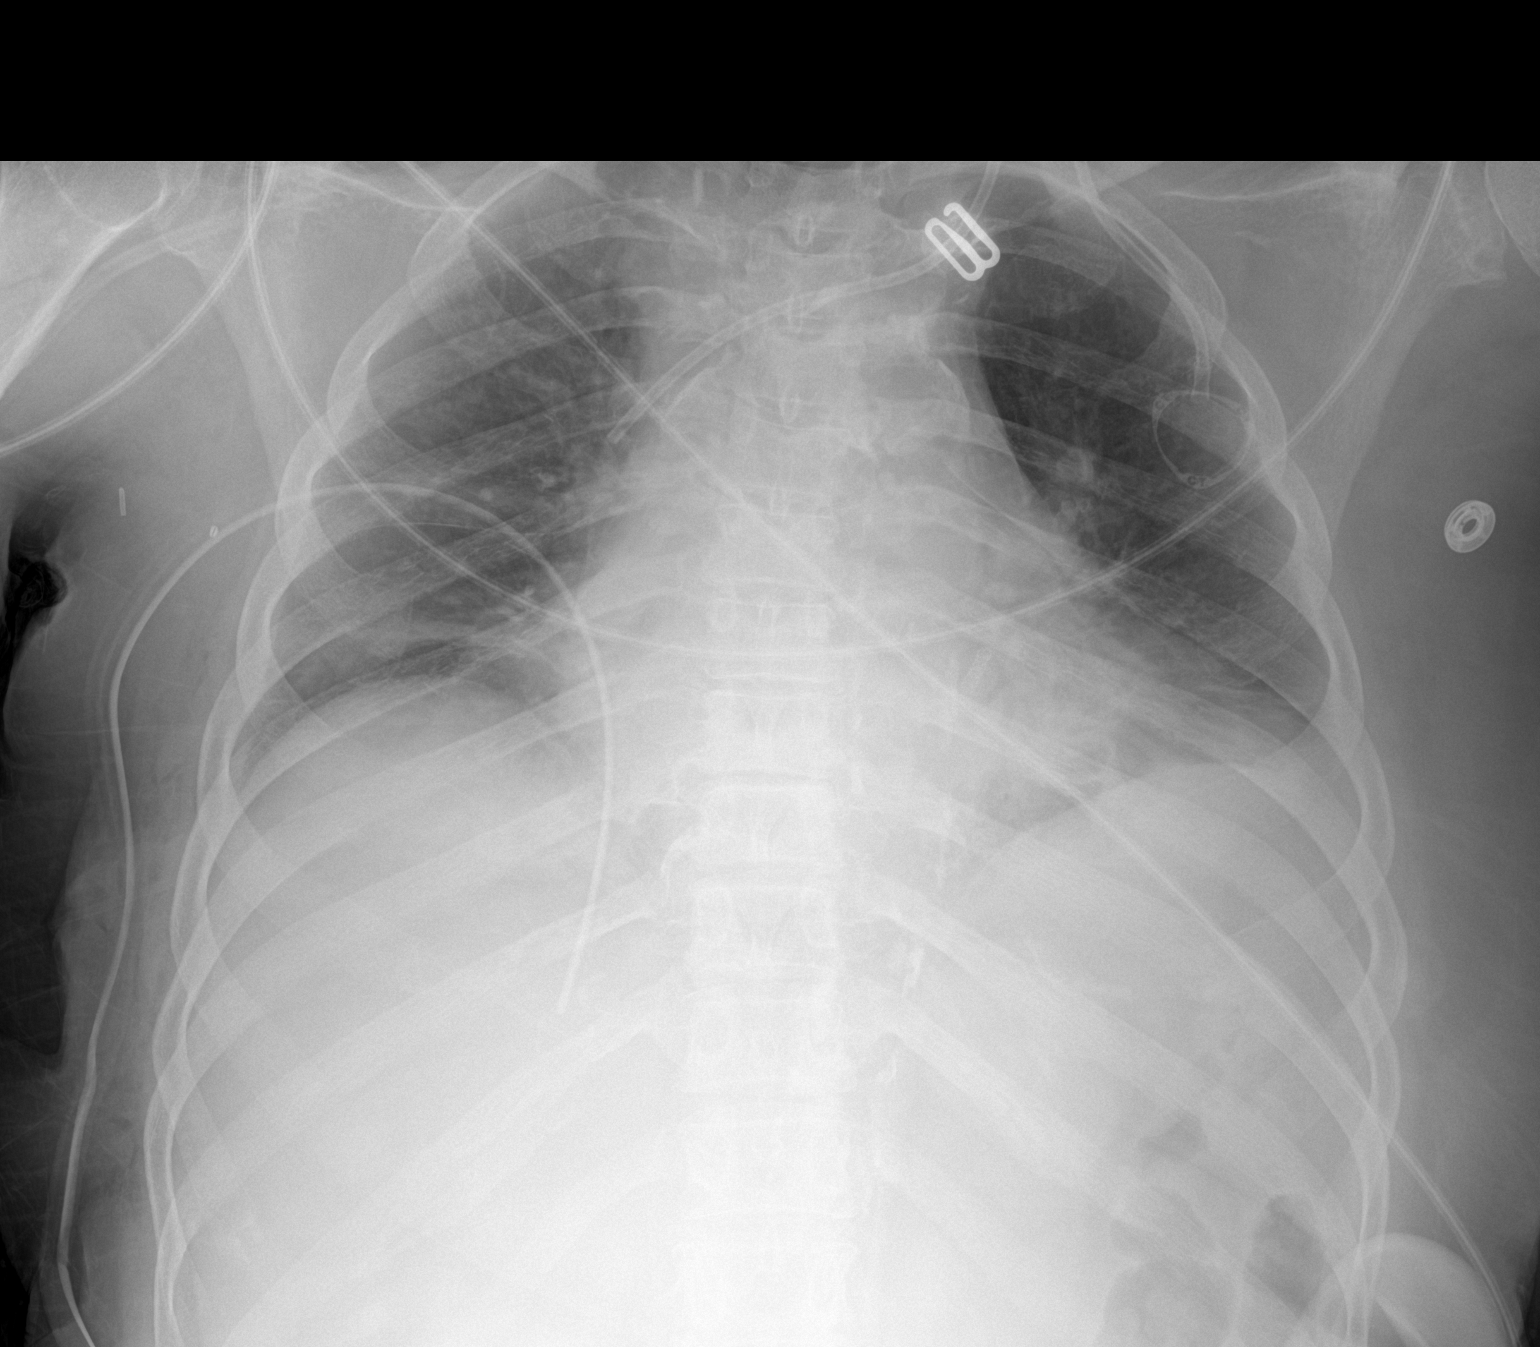

[1 of 1 positions shown; findings below may reference images not displayed]

FINDINGS: Left IJ Port-A-Cath has tip obliquely oriented over the SVC. Lungs
are hypoinflated with minimal linear bibasilar opacification likely
atelectasis. No definite effusion. No pneumothorax. Borderline
cardiomegaly. Remainder of the exam is unchanged.
IMPRESSION: Mild bibasilar linear density likely atelectasis. Borderline
cardiomegaly.

Left IJ Port-A-Cath with tip over the SVC.  No pneumothorax.

## 2020-08-07 NOTE — Telephone Encounter (Signed)
Needs 810 or 130 tele. She may need to call the specialist back if they called her

## 2020-08-09 NOTE — Telephone Encounter (Signed)
Patient has been scheduled

## 2020-08-09 NOTE — Telephone Encounter (Signed)
Place in 810 or 130 slot to discuss concerns.

## 2020-08-14 ENCOUNTER — Ambulatory Visit (HOSPITAL_COMMUNITY)
Admission: EM | Admit: 2020-08-14 | Discharge: 2020-08-14 | Disposition: A | Discharge status: Home / Self Care | Payer: 59 | Attending: Emergency Medicine | Admitting: Emergency Medicine

## 2020-08-14 ENCOUNTER — Other Ambulatory Visit: Payer: Self-pay

## 2020-08-14 ENCOUNTER — Encounter (HOSPITAL_COMMUNITY): Payer: Self-pay | Admitting: *Deleted

## 2020-08-14 DIAGNOSIS — M436 Torticollis: Secondary | ICD-10-CM

## 2020-08-14 MED ORDER — DEXAMETHASONE SODIUM PHOSPHATE 10 MG/ML IJ SOLN
10.0000 mg | Freq: Once | INTRAMUSCULAR | Status: AC
  Administered 2020-08-14: 10 mg via INTRAMUSCULAR

## 2020-08-14 MED ORDER — KETOROLAC TROMETHAMINE 30 MG/ML IJ SOLN
INTRAMUSCULAR | Status: AC
  Filled 2020-08-14: qty 1

## 2020-08-14 MED ORDER — KETOROLAC TROMETHAMINE 30 MG/ML IJ SOLN
30.0000 mg | Freq: Once | INTRAMUSCULAR | Status: AC
  Administered 2020-08-14: 30 mg via INTRAMUSCULAR

## 2020-08-14 MED ORDER — HYDROCODONE-ACETAMINOPHEN 5-325 MG PO TABS
1.0000 | ORAL_TABLET | Freq: Once | ORAL | Status: AC
  Administered 2020-08-14: 1 via ORAL

## 2020-08-14 MED ORDER — DEXAMETHASONE SODIUM PHOSPHATE 10 MG/ML IJ SOLN
INTRAMUSCULAR | Status: AC
  Filled 2020-08-14: qty 1

## 2020-08-14 MED ORDER — DICLOFENAC SODIUM 1 % EX GEL: 4.0000 g | Freq: Four times a day (QID) | CUTANEOUS | 1 refills | Status: DC

## 2020-08-14 MED ORDER — HYDROCODONE-ACETAMINOPHEN 5-325 MG PO TABS
ORAL_TABLET | ORAL | Status: AC
  Filled 2020-08-14: qty 1

## 2020-08-14 NOTE — ED Triage Notes (Signed)
Pt reports lt sided neck pain when turning head.

## 2020-08-14 NOTE — Discharge Instructions (Addendum)
Can continue use of tylenol as needed  Can rub gel onto neck 4 times a day  If pain lessens, try to stretch you neck by turning it and hold the position for 10 seconds 5 times each  If pain worsens, please go to the nearest emergency department for evaluation

## 2020-08-15 ENCOUNTER — Encounter: Payer: Self-pay | Admitting: Nurse Practitioner

## 2020-08-15 ENCOUNTER — Ambulatory Visit: Payer: 59 | Attending: Nurse Practitioner | Admitting: Nurse Practitioner

## 2020-08-15 DIAGNOSIS — N1832 Chronic kidney disease, stage 3b: Secondary | ICD-10-CM

## 2020-08-15 NOTE — Progress Notes (Signed)
Virtual Visit via Telephone Note Due to national recommendations of social distancing due to [name], telehealth visit is felt to be most appropriate for this patient at this time.  I discussed the limitations, risks, security and privacy concerns of performing an evaluation and management service by telephone and the availability of in person appointments. I also discussed with the patient that there may be a patient responsible charge related to this service. The patient expressed understanding and agreed to proceed.    I connected with [name] on 08/15/20  at   8:10 AM EDT  EDT by telephone and verified that I am speaking with the correct person using two identifiers.  Location of Patient: Private Residence   Location of Provider: [place] participating in Telemedicine visit: [name] FNP-BC [name]    History of Present Illness: Telemedicine visit for: CKD Stage 3  Patient needs to come into the office for lab work. She states she is receiving calls from [place] [facility] and unsure why they are calling her and she would like for me to give them a call to find out why. States she was told she had stage 3 "something" and then she starts crying saying she can't understand these people and why are they calling her.  I will reach out to the number she provided to try to provide more information. [name] [phone] [phone] She currently denies chest pain, shortness of breath, palpitations, lightheadedness, dizziness, headaches or BLE edema.        Past Medical History:  Diagnosis Date   Ambulates with cane    straight   Angina    no current problems per patient at PAT appt 11/05/18, nitroglycerin sl 06/30/20   Arthritis    HANDS"   Asthma    Cancer ([place]) 09/04/2017   Right Breast   Carpal tunnel syndrome, bilateral 11/26/2016   Cholelithiasis    Cocaine abuse ([place]) 07/2012   per E.R. drug screen    COPD (chronic obstructive pulmonary disease) ([place])    per patient   Coronary artery disease    Diabetes mellitus without mention of complication    type 2   Dysphagia    esophageal dysmotility on 03/2011 esophagram    Dyspnea    with exertion   Fatty liver disease, nonalcoholic 3762   on Imaging.    GERD (gastroesophageal reflux disease)    Headache    Hearing loss    bilateral - no hearing aids   History of colonic polyps 2009   adenomatous 2009, HP in 2009, 2011, 2013.    Hyperlipidemia    Hypertension    Iron deficiency anemia    Myocardial infarction [facility])    years ago, 6 stents placed   Ulnar neuropathy at elbow, 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   Wears dentures    full upper and partial lower   Wears glasses     Past Surgical History:  Procedure Laterality Date   ABDOMINAL ANGIOGRAM  02/20/2011   Procedure: ABDOMINAL ANGIOGRAM;  Surgeon: Clent Demark, MD;  Location: [facility] CATH LAB;  Service: Cardiovascular;;   BREAST BIOPSY Left    CARPAL TUNNEL RELEASE Right 07/04/2017   Procedure: RIGHT CARPAL TUNNEL RELEASE;  Surgeon: Leanora Cover, MD;  Location: [place];  Service: Orthopedics;  Laterality: Right;   CARPAL TUNNEL RELEASE Left 09/12/2017   Procedure: LEFT CARPAL TUNNEL RELEASE;  Surgeon: Leanora Cover, MD;  Location: [place]  [facility];  Service: Orthopedics;  Laterality: Left;   CHOLECYSTECTOMY N/A 11/11/2018   Procedure: ATTEMPTED LAPAROSCOPIC CHOLECUSTECOMY,  OPEN CHOLECYSTECTOMY;  Surgeon: Coralie Keens, MD;  [facility];  Service: General;  Laterality: N/A;   COLONOSCOPY  11/30/2011   Procedure: COLONOSCOPY;  Surgeon: Lafayette Dragon, MD;  [facility];  Service: Endoscopy;  Laterality: N/A;   CORONARY ANGIOPLASTY WITH STENT PLACEMENT  02/20/2011   ERCP N/A 10/03/2018   Procedure: ENDOSCOPIC RETROGRADE CHOLANGIOPANCREATOGRAPHY (ERCP);  Surgeon: Ladene Artist, MD;  Location: [facility];  Service: Endoscopy;  Laterality: N/A;    ESOPHAGOGASTRODUODENOSCOPY  11/30/2011   Procedure: ESOPHAGOGASTRODUODENOSCOPY (EGD);  Surgeon: Lafayette Dragon, MD;  Location: [facility];  Service: Endoscopy;  Laterality: N/A;   ESOPHAGOGASTRODUODENOSCOPY N/A 02/23/2015   Procedure: ESOPHAGOGASTRODUODENOSCOPY (EGD);  Surgeon: Gatha Mayer, MD;  Location: [facility] ENDOSCOPY;  Service: Endoscopy;  Laterality: N/A;   EYE SURGERY Bilateral    cataracts removed   INTRAOPERATIVE CHOLANGIOGRAM N/A 11/11/2018   Procedure: Intraoperative Cholangiogram;  Surgeon: Coralie Keens, MD;  Location: [place];  Service: General;  Laterality: N/A;   IR REMOVAL TUN ACCESS W/ PORT W/O FL MOD SED  06/30/2020   LEFT HEART CATHETERIZATION WITH CORONARY ANGIOGRAM N/A 02/20/2011   Procedure: LEFT HEART CATHETERIZATION WITH CORONARY ANGIOGRAM;  Surgeon: Clent Demark, MD;  Location: [facility] CATH LAB;  Service: Cardiovascular;  Laterality: N/A;   MASTECTOMY Right 11/05/2017   MASTECTOMY W/ SENTINEL NODE BIOPSY Right 11/27/2017   MASTECTOMY W/ SENTINEL NODE BIOPSY Right 11/27/2017   Procedure: RIGHT TOTAL MASTECTOMY WITH SENTINEL LYMPH NODE BIOPSY;  Surgeon: Excell Seltzer, MD;  Location: [place];  Service: General;  Laterality: Right;   PORT A CATH REVISION Left 05/07/2018   Procedure: PORT A CATH REVISION;  Surgeon: Excell Seltzer, MD;  [facility];  Service: General;  Laterality: Left;   PORTACATH PLACEMENT Left 11/27/2017   Procedure: INSERTION PORT-A-CATH;  Surgeon: Excell Seltzer, MD;  Location: [place];  Service: General;  Laterality: Left;   RIGHT COLECTOMY  02/2007   for post polypectomy colonic perforation   SPHINCTEROTOMY  10/03/2018   Procedure: SPHINCTEROTOMY;  Surgeon: Ladene Artist, MD;  [facility];  Service: Endoscopy;;   TUBAL LIGATION      Family History  Problem Relation Age of Onset   Heart disease Mother    Diabetes Mother    Cancer Father        unsure what kind   Diabetes Sister    Colon cancer Neg Hx    Breast cancer Neg  Hx     Social History   Socioeconomic History   Marital status: Divorced    Spouse name: Not on file   Number of children: 2   Years of education: Not on file   Highest education level: Not on file  Occupational History   Occupation: retired. prev worked in housekeeping w/ [employer]  Tobacco Use   Smoking status: Every Day    Packs/day: 0.50    Years: 47.00    Pack years: 23.50    Types: Cigarettes    Last attempt to quit: 05/09/2010    Years since quitting: 10.2   Smokeless tobacco: Never  Vaping Use   Vaping Use: Never used  Substance and Sexual Activity   Alcohol use: Yes   Drug use: Not Currently    Types: Cocaine    Comment: positive drug screen (cocaine, benzo's)07/2012 in emergency room   Sexual activity: Not Currently    Birth control/protection:  Post-menopausal  Other Topics Concern   Not on file  Social History Narrative   Caffeine use daily.   Social Determinants of Health   Financial Resource Strain: Not on file  Food Insecurity: Not on file  Transportation Needs: Not on file  Physical Activity: Not on file  Stress: Not on file  Social Connections: Not on file     Observations/Objective: Awake, alert and oriented x 3   Review of Systems  Constitutional:  Negative for fever, malaise/fatigue and weight loss.  HENT: Negative.  Negative for nosebleeds.   Eyes: Negative.  Negative for blurred vision, double vision and photophobia.  Respiratory: Negative.  Negative for cough and shortness of breath.   Cardiovascular: Negative.  Negative for chest pain, palpitations and leg swelling.  Gastrointestinal: Negative.  Negative for heartburn, nausea and vomiting.  Musculoskeletal: Negative.  Negative for myalgias.  Neurological: Negative.  Negative for dizziness, focal weakness, seizures and headaches.  Psychiatric/Behavioral: Negative.  Negative for suicidal ideas.    Assessment and Plan: Diagnoses and all orders for this visit:  Stage 3b chronic kidney disease  ([place]) Follow Up Instructions Return for BLOOD WORK.     I discussed the assessment and treatment plan with the patient. The patient was provided an opportunity to ask questions and all were answered. The patient agreed with the plan and demonstrated an understanding of the instructions.   The patient was advised to call back or seek an in-person evaluation if the symptoms worsen or if the condition fails to improve as anticipated.  I provided 10 minutes of non-face-to-face time during this encounter including median intraservice time, reviewing previous notes, labs, imaging, medications and explaining diagnosis and management.  Dominique Pounds, FNP-BC

## 2020-08-16 ENCOUNTER — Other Ambulatory Visit: Payer: Self-pay | Admitting: Nurse Practitioner

## 2020-08-16 ENCOUNTER — Telehealth: Payer: Self-pay | Admitting: Nurse Practitioner

## 2020-08-16 DIAGNOSIS — N1832 Chronic kidney disease, stage 3b: Secondary | ICD-10-CM

## 2020-08-16 NOTE — Telephone Encounter (Signed)
Copied from [place] [id number]. Topic: General - Other >> Aug 09, 2020 12:48 PM Celene Kras wrote: Reason for CRM: Pt called stating that she is needing to have a kidney specialist as advised by PCP. She states that she is needing to have more info and is requesting to have a call back. Please advise.

## 2020-08-16 NOTE — Telephone Encounter (Signed)
Referral has been placed. They will call her to schedule an appointment

## 2020-08-18 ENCOUNTER — Telehealth: Payer: Self-pay | Admitting: Nurse Practitioner

## 2020-08-18 NOTE — Telephone Encounter (Signed)
Called back to verify Symbicort sig. All questions and concerns addressed. Spoke to pharmacist [employer].

## 2020-08-18 NOTE — Telephone Encounter (Signed)
Copied from [place] [id number]. Topic: General - Other >> Aug 18, 2020 10:51 AM Valere Dross wrote: Reason for CRM: [place] from [facility] called in to return [place] vm concerning the pt, [place] asked for a CB:[phone]. Please advise

## 2020-08-18 NOTE — Telephone Encounter (Signed)
[place] calling form [place] called to get clarification on directions for medication listed below   Best contact: [phone] budesonide-formoterol (SYMBICORT) 160-4.5 MCG/ACT inhaler

## 2020-08-22 NOTE — ED Provider Notes (Signed)
[place]    CSN: [id number] Arrival date & time: 08/14/20  1357      History   Chief Complaint Chief Complaint  Patient presents with   Torticollis    HPI [name] is a 70 y.o. female.   Patient presents with left sided neck pain making it very painful to turn head from side to side. Has worsened since waking up this morning. Thinks she may have slept wrong. Denies numbness, tingling, prior injury.   Past Medical History:  Diagnosis Date   Ambulates with cane    straight   Angina    no current problems per patient at PAT appt 11/05/18, nitroglycerin sl 06/30/20   Arthritis    HANDS"   Asthma    Cancer ([place]) 09/04/2017   Right Breast   Carpal tunnel syndrome, bilateral 11/26/2016   Cholelithiasis    Cocaine abuse ([place]) 07/2012   per E.R. drug screen   COPD (chronic obstructive pulmonary disease) ([place])    per patient   Coronary artery disease    Diabetes mellitus without mention of complication    type 2   Dysphagia    esophageal dysmotility on 03/2011 esophagram    Dyspnea    with exertion   Fatty liver disease, nonalcoholic 4034   on Imaging.    GERD (gastroesophageal reflux disease)    Headache    Hearing loss    bilateral - no hearing aids   History of colonic polyps 2009   adenomatous 2009, HP in 2009, 2011, 2013.    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   Wears dentures    full upper and partial lower   Wears glasses     Patient Active Problem List   Diagnosis Date Noted   ESRD (end stage renal disease) ([place]) 03/15/2020   Insulin dependent type 2 diabetes mellitus ([place]) 12/09/2018   COPD (chronic obstructive pulmonary disease) ([place]) 12/09/2018   Chronic cholecystitis 11/11/2018   Hemobilia    Dilated bile duct    Jaundice    Elevated LFTs    Choledocholithiasis 10/02/2018   CKD (chronic kidney  disease), stage III    Acute cholecystitis    Elevated liver enzymes 09/18/2018   Calculus of gallbladder and bile duct with acute cholecystitis, with obstruction    Melena    Unstable angina ([place]) 07/12/2018   Port-A-Cath in place 02/27/2018   Cancer of overlapping sites of right breast ([place]) 11/27/2017   Malignant neoplasm of upper-outer quadrant of right breast in female, estrogen receptor positive ([place]) 10/03/2017   Carpal tunnel syndrome, bilateral 11/26/2016   Ulnar neuropathy at elbow, 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   Abdominal pain, epigastric    Acute on chronic renal failure ([place]) 02/19/2015   Diarrhea    Nausea with vomiting    Hyperglycemia 04/01/2014   Hyperosmolar non-ketotic state in patient with type 2 diabetes mellitus ([place]) 04/01/2014   Nausea vomiting and diarrhea 04/01/2014   CAD (coronary artery disease) 04/01/2014   Dehydration    Diabetic ketoacidosis without coma associated with diabetes mellitus due to underlying condition ([place])    High anion gap metabolic acidosis    Cocaine abuse ([place])    Toxic encephalopathy-Unlikely metabolic,?psychogenic 08/02/2012   Bereavement 02/02/2012   Depression 02/02/2012   Iron deficiency anemia, unspecified 11/30/2011   Nonspecific abnormal finding in stool contents  11/30/2011   Benign neoplasm of colon 11/30/2011   Cough 05/14/2011   Chest pain 03/19/2011   Macrocytic anemia 03/19/2011   Type 2 diabetes mellitus with complication, without long-term current use of insulin ([place]) 03/19/2011   DOE (dyspnea on exertion) 03/19/2011   Chest pain at rest 02/18/2011    Class: Acute   Cholelithiasis 11/01/2010   DM 04/08/2009   HYPERLIPIDEMIA 04/08/2009   HTN (hypertension) 04/08/2009   MYOCARDIAL INFARCTION 04/08/2009   Coronary atherosclerosis 04/08/2009   GERD 04/08/2009   FATTY LIVER DISEASE 04/08/2009   COLONIC POLYPS, HX OF 04/08/2009    Past Surgical History:  Procedure Laterality Date   ABDOMINAL ANGIOGRAM  02/20/2011    Procedure: ABDOMINAL ANGIOGRAM;  Surgeon: Clent Demark, MD;  Location: [facility] CATH LAB;  Service: Cardiovascular;;   BREAST BIOPSY Left    CARPAL TUNNEL RELEASE Right 07/04/2017   Procedure: RIGHT CARPAL TUNNEL RELEASE;  Surgeon: Leanora Cover, MD;  Location: [place];  Service: Orthopedics;  Laterality: Right;   CARPAL TUNNEL RELEASE Left 09/12/2017   Procedure: LEFT CARPAL TUNNEL RELEASE;  Surgeon: Leanora Cover, MD;  Location: [facility];  Service: Orthopedics;  Laterality: Left;   CHOLECYSTECTOMY N/A 11/11/2018   Procedure: ATTEMPTED LAPAROSCOPIC CHOLECUSTECOMY,  OPEN CHOLECYSTECTOMY;  Surgeon: Coralie Keens, MD;  Location: [place];  Service: General;  Laterality: N/A;   COLONOSCOPY  11/30/2011   Procedure: COLONOSCOPY;  Surgeon: Lafayette Dragon, MD;  [facility];  Service: Endoscopy;  Laterality: N/A;   CORONARY ANGIOPLASTY WITH STENT PLACEMENT  02/20/2011   ERCP N/A 10/03/2018   Procedure: ENDOSCOPIC RETROGRADE CHOLANGIOPANCREATOGRAPHY (ERCP);  Surgeon: Ladene Artist, MD;  Location: [facility];  Service: Endoscopy;  Laterality: N/A;   ESOPHAGOGASTRODUODENOSCOPY  11/30/2011   Procedure: ESOPHAGOGASTRODUODENOSCOPY (EGD);  Surgeon: Lafayette Dragon, MD;  Location: [facility];  Service: Endoscopy;  Laterality: N/A;   ESOPHAGOGASTRODUODENOSCOPY N/A 02/23/2015   Procedure: ESOPHAGOGASTRODUODENOSCOPY (EGD);  Surgeon: Gatha Mayer, MD;  Location[facility] ENDOSCOPY;  Service: Endoscopy;  Laterality: N/A;   EYE SURGERY Bilateral    cataracts removed   INTRAOPERATIVE CHOLANGIOGRAM N/A 11/11/2018   Procedure: Intraoperative Cholangiogram;  Surgeon: Coralie Keens, MD;  Location: [place];  Service: General;  Laterality: N/A;   IR REMOVAL TUN ACCESS W/ PORT W/O FL MOD SED  06/30/2020   LEFT HEART CATHETERIZATION WITH CORONARY ANGIOGRAM N/A 02/20/2011   Procedure: LEFT HEART CATHETERIZATION WITH CORONARY ANGIOGRAM;  Surgeon: Clent Demark, MD;  [facility];   Service: Cardiovascular;  Laterality: N/A;   MASTECTOMY Right 11/05/2017   MASTECTOMY W/ SENTINEL NODE BIOPSY Right 11/27/2017   MASTECTOMY W/ SENTINEL NODE BIOPSY Right 11/27/2017   Procedure: RIGHT TOTAL MASTECTOMY WITH SENTINEL LYMPH NODE BIOPSY;  Surgeon: Excell Seltzer, MD;  Location: [place];  Service: General;  Laterality: Right;   PORT A CATH REVISION Left 05/07/2018   Procedure: PORT A CATH REVISION;  Surgeon: Excell Seltzer, MD;  [facility];  Service: General;  Laterality: Left;   PORTACATH PLACEMENT Left 11/27/2017   Procedure: INSERTION PORT-A-CATH;  Surgeon: Excell Seltzer, MD;  Location: [place];  Service: General;  Laterality: Left;   RIGHT COLECTOMY  02/2007   for post polypectomy colonic perforation   SPHINCTEROTOMY  10/03/2018   Procedure: SPHINCTEROTOMY;  Surgeon: Ladene Artist, MD;  [facility];  Service: Endoscopy;;   TUBAL LIGATION      OB History   No obstetric history on file.      Home Medications  Prior to Admission medications   Medication Sig Start Date End Date Taking? Authorizing Provider  diclofenac Sodium (VOLTAREN) 1 % GEL Apply 4 g topically 4 (four) times daily. 08/14/20  Yes Aden Sek R, NP  albuterol (PROVENTIL) (2.5 MG/3ML) 0.083% nebulizer solution Take 2.5 mg by nebulization every 6 (six) hours as needed for wheezing or shortness of breath.    [provider]  ALPRAZolam Duanne Moron) 0.25 MG tablet Take 0.25 mg by mouth at bedtime.    [provider]  amLODipine (NORVASC) 5 MG tablet Take 1 tablet (5 mg total) by mouth daily. 03/15/20   Gildardo Pounds, NP  anastrozole (ARIMIDEX) 1 MG tablet TAKE ONE TABLET BY MOUTH DAILY 01/19/20   Nicholas Lose, MD  aspirin 81 MG EC tablet Take 1 tablet (81 mg total) by mouth daily. 10/13/18   Pokhrel, Corrie Mckusick, MD  atorvastatin (LIPITOR) 20 MG tablet Take 1 tablet (20 mg total) by mouth daily at 6 PM. 03/15/20 06/13/20  Gildardo Pounds, NP  budesonide-formoterol  (SYMBICORT) 160-4.5 MCG/ACT inhaler Inhale 1 puff into the lungs 2 (two) times daily as needed (respiratory issues.). 03/15/20   Gildardo Pounds, NP  Insulin Pen Needle (TRUEPLUS PEN NEEDLES) 31G X 8 MM MISC Inject into the skin once nightly.  E11.65 03/31/20   Gildardo Pounds, NP  LANTUS SOLOSTAR 100 UNIT/ML Solostar Pen Inject 5 Units into the skin at bedtime. E11.65 03/15/20 06/13/20  Gildardo Pounds, NP  liraglutide (VICTOZA) 18 MG/3ML SOPN Inject 1.8 mg into the skin daily. 05/31/20   Gildardo Pounds, NP  losartan (COZAAR) 25 MG tablet Take 1 tablet (25 mg total) by mouth daily. 06/29/20 09/27/20  Gildardo Pounds, NP  metoprolol succinate (TOPROL-XL) 50 MG 24 hr tablet Take 1 tablet (50 mg total) by mouth daily. Take with or immediately following a meal. 03/15/20   Gildardo Pounds, NP  Misc. Devices MISC Please provide patient with nebulizer mask and tubing. [id number] 12/14/19   Gildardo Pounds, NP  Multiple Vitamin (MULTIVITAMIN WITH MINERALS) TABS tablet Take 1 tablet by mouth daily. [place]    [provider]  nitroGLYCERIN (NITROSTAT) 0.4 MG SL tablet Place 1 tablet (0.4 mg total) under the tongue every 5 (five) minutes as needed for chest pain. 03/15/20   Gildardo Pounds, NP  Omega-3 Fatty Acids (FISH OIL) 1000 MG CAPS Take 1,000 mg by mouth daily.    [provider]  pantoprazole (PROTONIX) 40 MG tablet Take 1 tablet (40 mg total) by mouth daily. 03/15/20 06/13/20  Gildardo Pounds, NP  traMADol (ULTRAM) 50 MG tablet Take 1 tablet (50 mg total) by mouth every 6 (six) hours as needed for moderate pain. 11/18/18   Kalman Drape, PA    Family History Family History  Problem Relation Age of Onset   Heart disease Mother    Diabetes Mother    Cancer Father        unsure what kind   Diabetes Sister    Colon cancer Neg Hx    Breast cancer Neg Hx     Social History Social History   Tobacco Use   Smoking status: Every Day    Packs/day: 0.50    Years: 47.00    Pack years:  23.50    Types: Cigarettes    Last attempt to quit: 05/09/2010    Years since quitting: 10.2   Smokeless tobacco: Never  Vaping Use   Vaping Use: Never used  Substance Use Topics  Alcohol use: Yes   Drug use: Not Currently    Types: Cocaine    Comment: positive drug screen (cocaine, benzo's)07/2012 in emergency room     Allergies   Patient has no known allergies.   Review of Systems Review of Systems  Constitutional: Negative.   Respiratory: Negative.    Cardiovascular: Negative.   Musculoskeletal:  Positive for neck pain. Negative for arthralgias, back pain, gait problem, joint swelling, myalgias and neck stiffness.  Skin: Negative.     Physical Exam Triage Vital Signs ED Triage Vitals  Enc Vitals Group     BP 08/14/20 1409 (!) 155/59     Pulse Rate 08/14/20 1409 84     Resp 08/14/20 1409 20     Temp 08/14/20 1409 98.4 F (36.9 C)     Temp src --      SpO2 08/14/20 1409 100 %     Weight --      Height --      Head Circumference --      Peak Flow --      Pain Score 08/14/20 1412 6     Pain Loc --      Pain Edu? --      Excl. in [place]? --    No data found.  Updated Vital Signs BP (!) 155/59   Pulse 84   Temp 98.4 F (36.9 C)   Resp 20   LMP  (LMP Unknown) Comment: tubal ligation  SpO2 100%   Visual Acuity Right Eye Distance:   Left Eye Distance:   Bilateral Distance:    Right Eye Near:   Left Eye Near:    Bilateral Near:     Physical Exam Constitutional:      Appearance: Normal appearance. She is normal weight.  HENT:     Head: Normocephalic.  Eyes:     Extraocular Movements: Extraocular movements intact.  Neck:     Comments: No point tenderness noted, no spinal tenderness, neck is not stiff, ROM intact but minimal effort due to pain  Pulmonary:     Effort: Pulmonary effort is normal.  Musculoskeletal:        General: Normal range of motion.  Skin:    General: Skin is warm and dry.  Neurological:     Mental Status: She is alert and  oriented to person, place, and time. Mental status is at baseline.  Psychiatric:        Mood and Affect: Mood normal.        Behavior: Behavior normal.     UC Treatments / Results  Labs (all labs ordered are listed, but only abnormal results are displayed) Labs Reviewed - No data to display  EKG   Radiology No results found.  Procedures Procedures (including critical care time)  Medications Ordered in UC Medications  ketorolac (TORADOL) 30 MG/ML injection 30 mg (30 mg Intramuscular Given 08/14/20 1455)  dexamethasone (DECADRON) injection 10 mg (10 mg Intramuscular Given 08/14/20 1455)  HYDROcodone-acetaminophen (NORCO/VICODIN) 5-325 MG per tablet 1 tablet (1 tablet Oral Given 08/14/20 1533)    Initial Impression / Assessment and Plan / UC Course  I have reviewed the triage vital signs and the nursing notes.  Pertinent labs & imaging results that were available during my care of the patient were reviewed by me and considered in my medical decision making (see chart for details).  Acute torticollis  Toradol 30 mg IM now, Decadron 10 mg IM now, no relief felt after 15 minutes, Vicodin  5-325 mg now, has driver Diclofenac gel 1% 4 times a day prn Advised that is pain continues or worsens to go to emergency department  Final Clinical Impressions(s) / UC Diagnoses   Final diagnoses:  Torticollis, acute     Discharge Instructions      Can continue use of tylenol as needed  Can rub gel onto neck 4 times a day  If pain lessens, try to stretch you neck by turning it and hold the position for 10 seconds 5 times each  If pain worsens, please go to the nearest emergency department for evaluation    ED Prescriptions     Medication Sig Dispense Auth. Provider   diclofenac Sodium (VOLTAREN) 1 % GEL Apply 4 g topically 4 (four) times daily. 100 g Hans Eden, NP      PDMP not reviewed this encounter.   Hans Eden, [place] 08/22/20 [phone]

## 2020-08-28 IMAGING — CT CT ABD-PELV W/ CM
2 of 5 series · 13 of 46 positions shown, 15 images · IV contrast (iopamidol)
Comparison: CT AP 02/19/2015

CLINICAL DATA: Right-sided abdominal and chest pain for several
weeks. History of right mastectomy in [REDACTED].

EXAM:
CT CHEST, ABDOMEN, AND PELVIS WITH CONTRAST
TECHNIQUE: Multidetector CT imaging of the chest, abdomen and pelvis was
performed following the standard protocol during bolus
administration of intravenous contrast.
CONTRAST:  80mL [id number] IOPAMIDOL ([id number]) INJECTION 61%

[Series 4: cor · coronal · 0.80mm/px · 3 of 95 slices shown]
[im 32/95  soft-tissue]
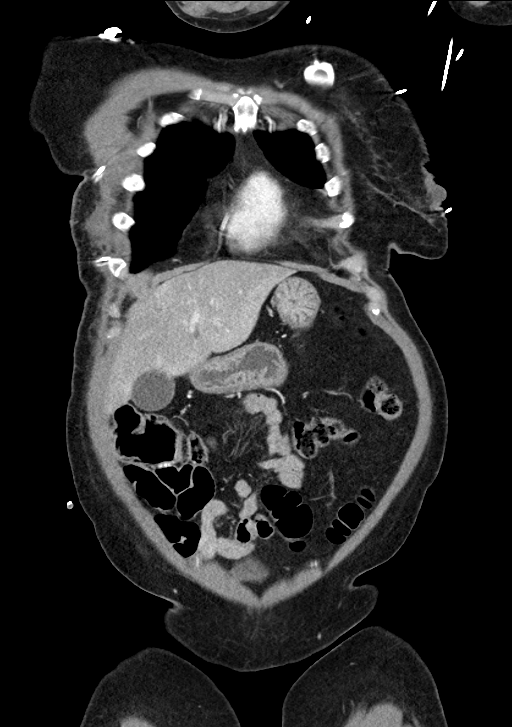
[im 42/95  soft-tissue]
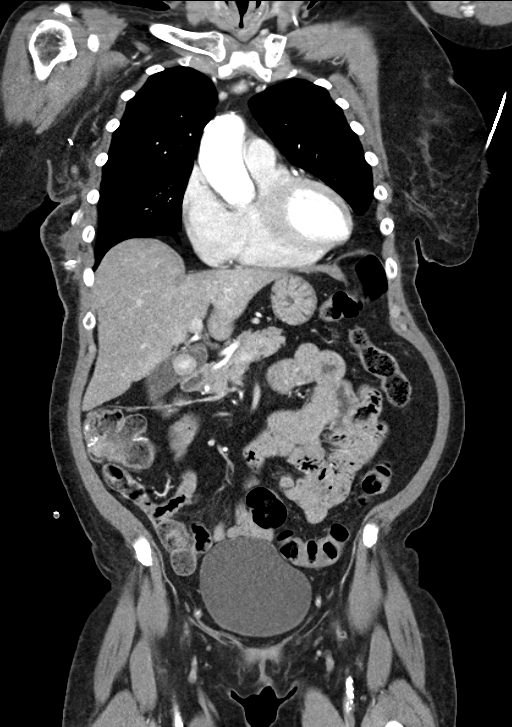
[im 53/95  soft-tissue]
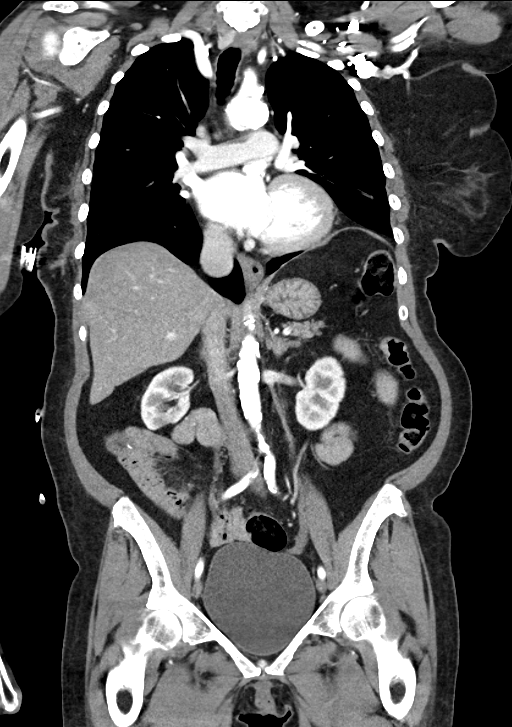

[Series 9: cap with · axial · 0.64mm/px · z∈[+849,+1324]mm · 10 of 117 slices shown, 12 images]
[im 11/117  soft-tissue]
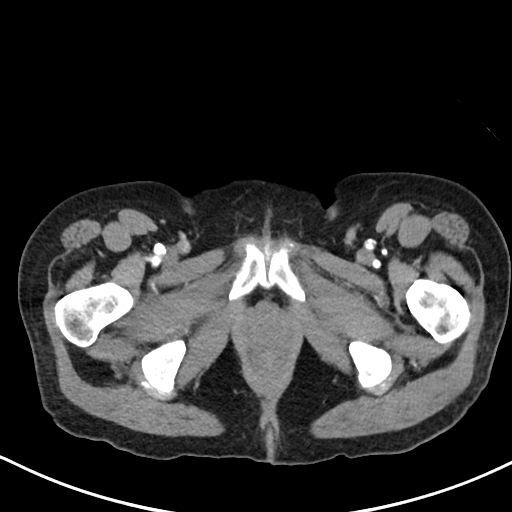
[im 11/117  bone]
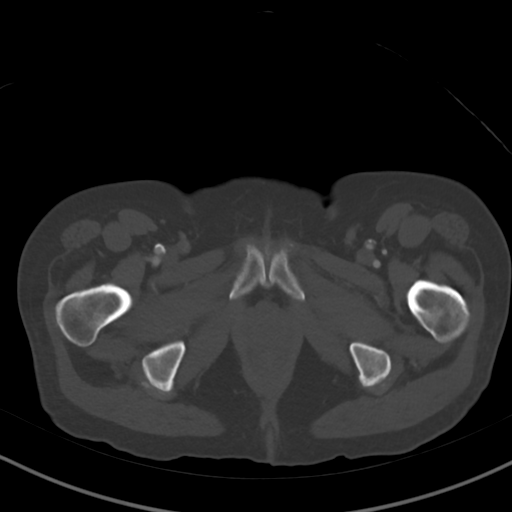
[im 22/117  soft-tissue]
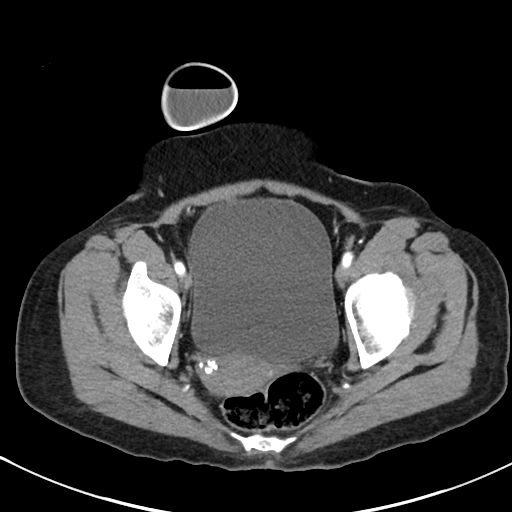
[im 32/117  soft-tissue]
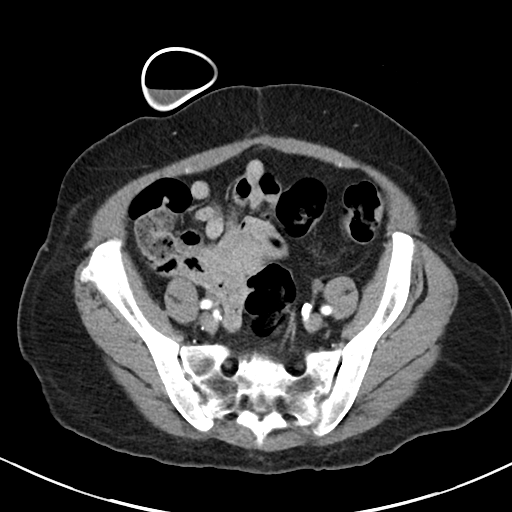
[im 43/117  soft-tissue]
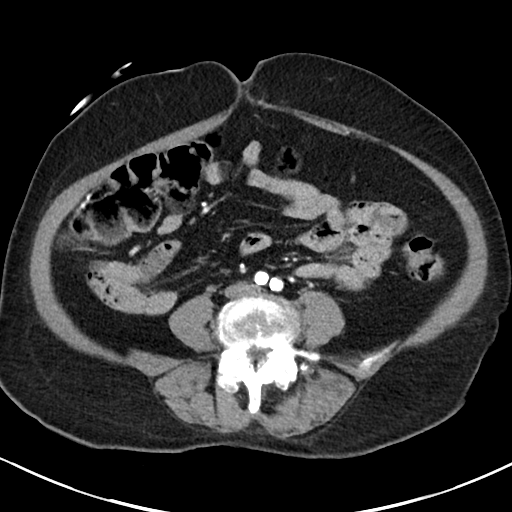
[im 53/117  soft-tissue]
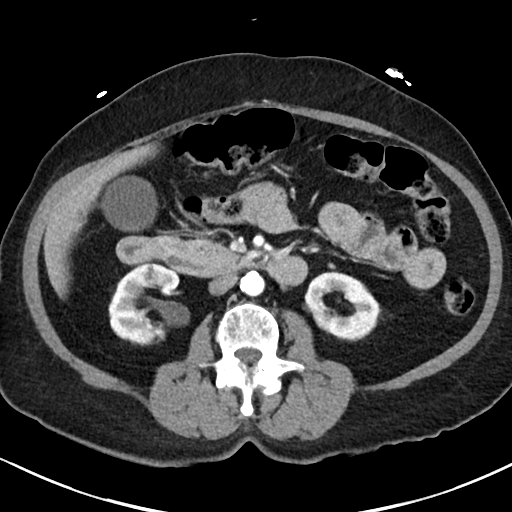
[im 64/117  soft-tissue]
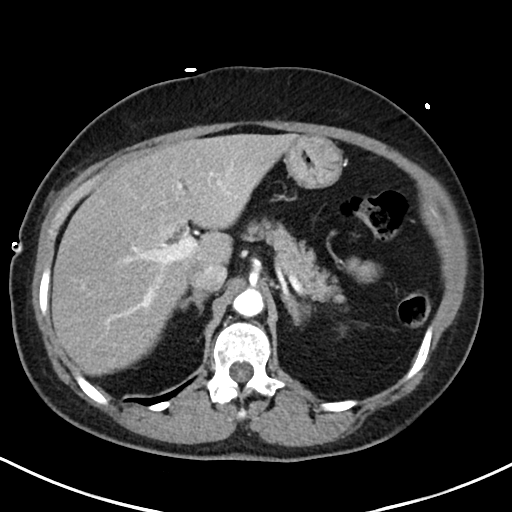
[im 74/117  soft-tissue]
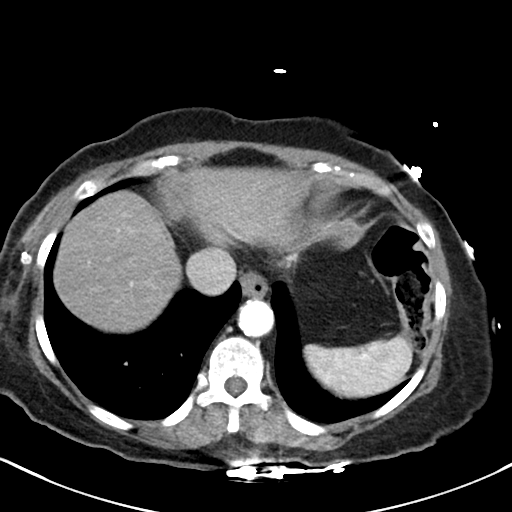
[im 85/117  soft-tissue]
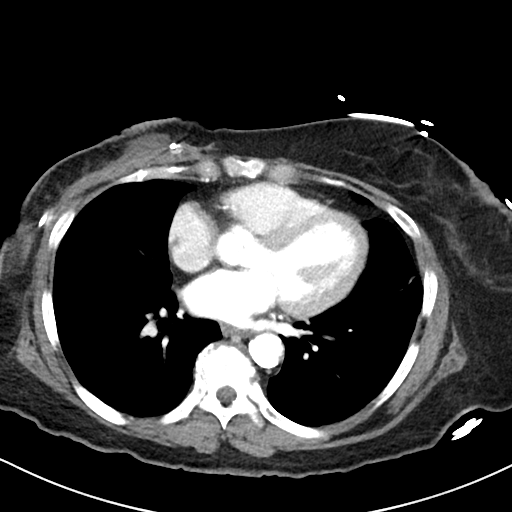
[im 95/117  soft-tissue]
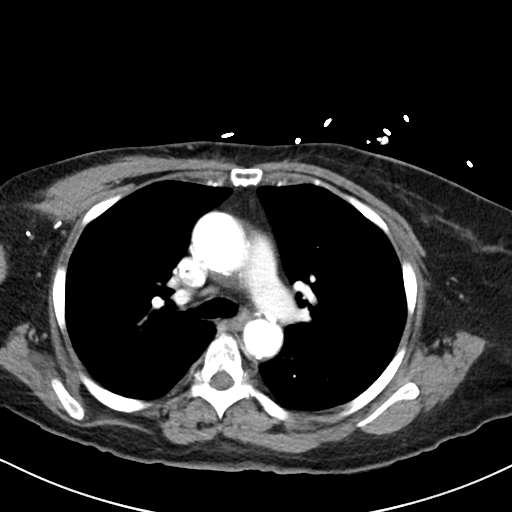
[im 95/117  bone]
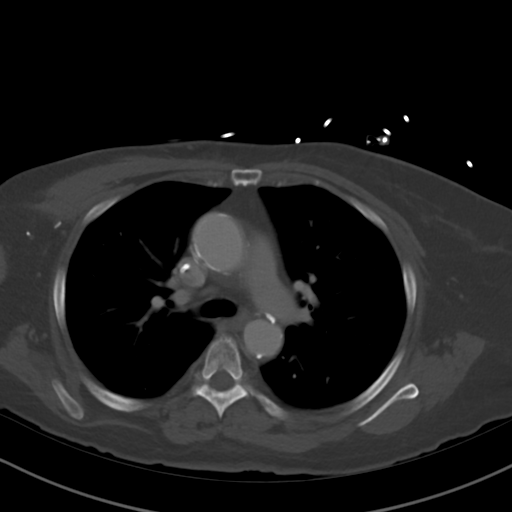
[im 106/117  soft-tissue]
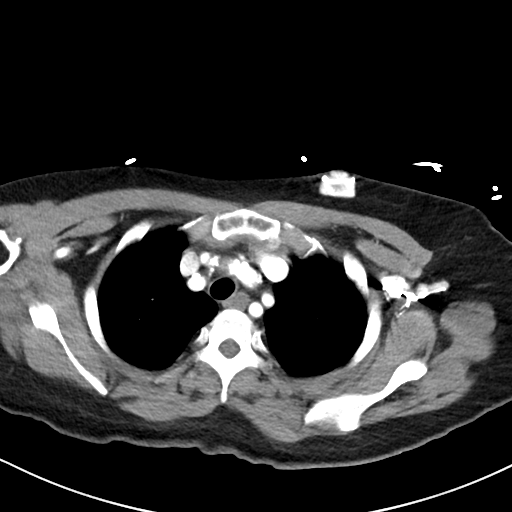

[13 of 46 positions shown; findings below may reference images not displayed]

FINDINGS: CT CHEST FINDINGS

Cardiovascular: Top normal heart size without pericardial effusion
or thickening. Left main and three-vessel coronary arteriosclerosis
is identified. Ectasia of the ascending thoracic aorta to 3.8 cm. No
dissection or aneurysm. Moderate atherosclerosis of the aortic arch
and descending thoracic aorta. No large central pulmonary embolus.
Atherosclerosis of the great vessels.

Mediastinum/Nodes: No enlarged mediastinal, hilar, or axillary lymph
nodes. Thyroid gland, trachea, and esophagus demonstrate no
significant findings.

Lungs/Pleura: 2 mm nodules in the right upper lobe, series [DATE] and
series [DATE]. Atelectasis noted at the left lung base. No effusion or
pneumothorax. No mass consolidations. No pulmonary consolidation.
Scattered centrilobular emphysema.

Musculoskeletal: Status post right mastectomy with surgical drain at
the surgical bed. Low-density fluid outlines the surgical drain
compatible with postop seroma. This measures approximately 10 x
x 5.1 cm in transverse by AP by craniocaudad. No complicating
features or enhancement to suggest abscess.

CT ABDOMEN PELVIS FINDINGS

Hepatobiliary: No focal liver abnormality is seen. 16 mm
nonobstructing gallstone is noted. No gallbladder wall thickening or
pericholecystic fluid. No evidence of acute cholecystitis. No
intrahepatic ductal dilatation..

Pancreas: Unremarkable. No pancreatic ductal dilatation or
surrounding inflammatory changes.

Spleen: Normal in size without focal abnormality.

Adrenals/Urinary Tract: Normal bilateral adrenal glands. Mild
cortical scarring in the upper pole the right kidney with scattered
low-density cysts of both kidneys too small further characterize. No
nephrolithiasis nor obstructive uropathy. The urinary bladder is
physiologically distended without focal mural thickening or
calculus.

Stomach/Bowel: Moderate stool retention within the colon. The
stomach is decompressed in appearance. There is normal small bowel
rotation without obstruction or inflammation. Right colectomy noted
with chain sutures the right lower quadrant at site of small and
large bowel anastomosis.

Vascular/Lymphatic: Aortoiliac atherosclerosis without aneurysm.
Patent branch vessels. No lymphadenopathy.

Reproductive: Calcified uterine fibroids on the right. No adnexal
mass.

Other: Small periumbilical fat containing hernia. Fat containing
bilateral inguinal hernias. No free air nor free fluid.

Musculoskeletal: Lower thoracic spondylosis with multilevel
degenerative disc disease from T8 through T10. No acute nor
aggressive osseous lesions.
IMPRESSION: 1. Status post right mastectomy with surgical drain in place.
Low-density fluid collection in the surgical bed compatible with
postop seroma measuring 10 x 1.1 x 5.1 cm. No complicating features
or enhancement to suggest abscess.
2. Left main and three-vessel coronary arteriosclerosis.
3. Ectasia of the ascending thoracic aorta to 3.8 cm. Recommend
annual imaging followup by CTA or MRA. This recommendation follows
1828 ACCF/AHA/AATS/ACR/ASA/SCA/LZ/SOE/SAILAJA/KAMISOKO Guidelines for the
Diagnosis and Management of Patients with Thoracic Aortic Disease.
1828; 121: [id number].
4. Uncomplicated cholelithiasis.
5. Mild cortical scarring in the upper pole the right kidney with
scattered low-density cysts of both kidneys. No obstructive
uropathy.
6. Aortoiliac atherosclerosis without aneurysm.
7. Small fat containing periumbilical and bilateral inguinal
hernias.
8. 2 mm nodules in the right upper lobe. No follow-up needed if
patient is low-risk (and has no known or suspected primary
neoplasm). Non-contrast chest CT can be considered in 12 months if
patient is high-risk. This recommendation follows the consensus
statement: Guidelines for Management of Incidental Pulmonary Nodules
Detected on CT Images: From the [HOSPITAL] 7680; Radiology

## 2020-08-28 IMAGING — CR DG CHEST 2V
2 series · 2 of 2 positions shown · non-contrast
Comparison: 11/27/2017

CLINICAL DATA: Right-sided chest pain

EXAM:
CHEST - 2 VIEW

[chest lat]
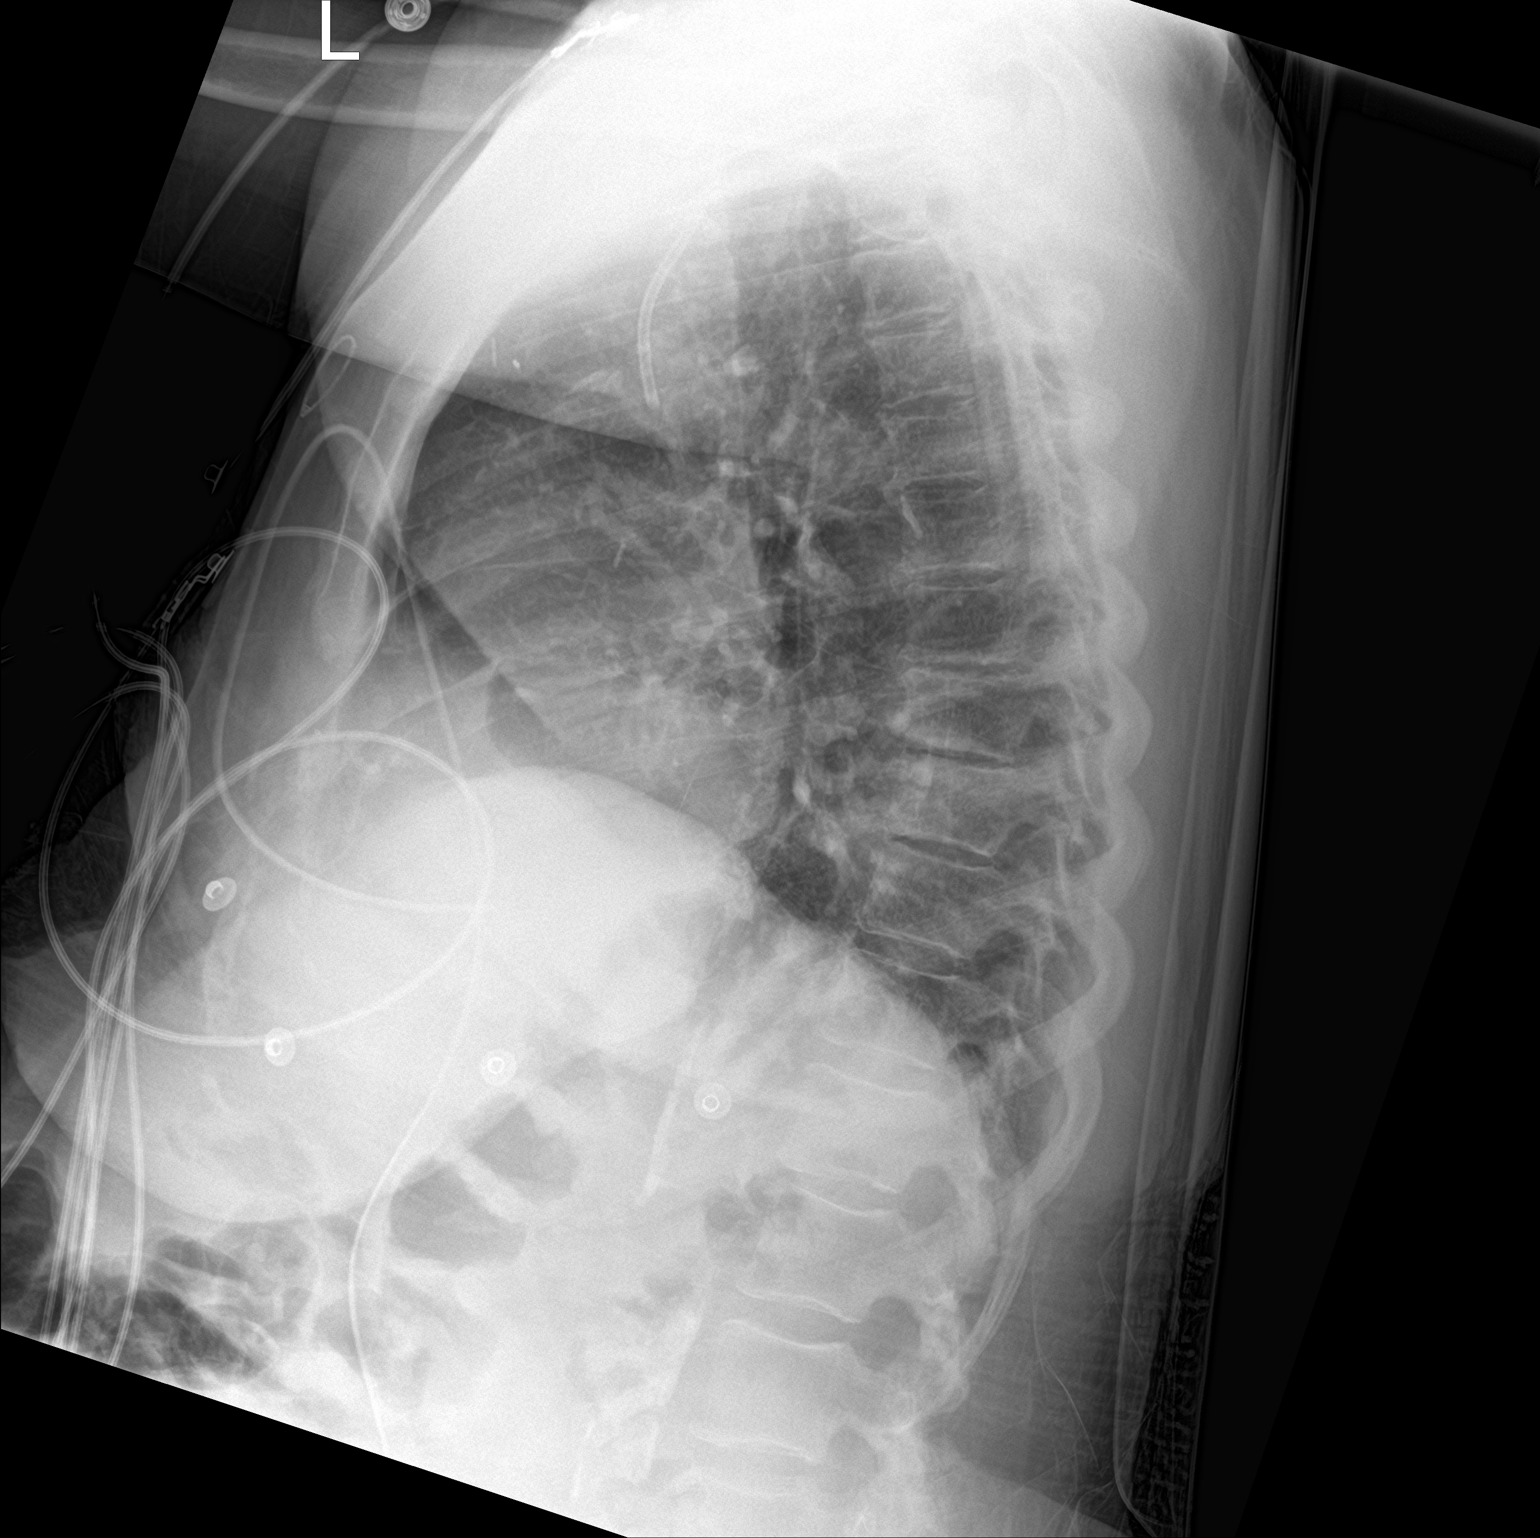

[chest ap]
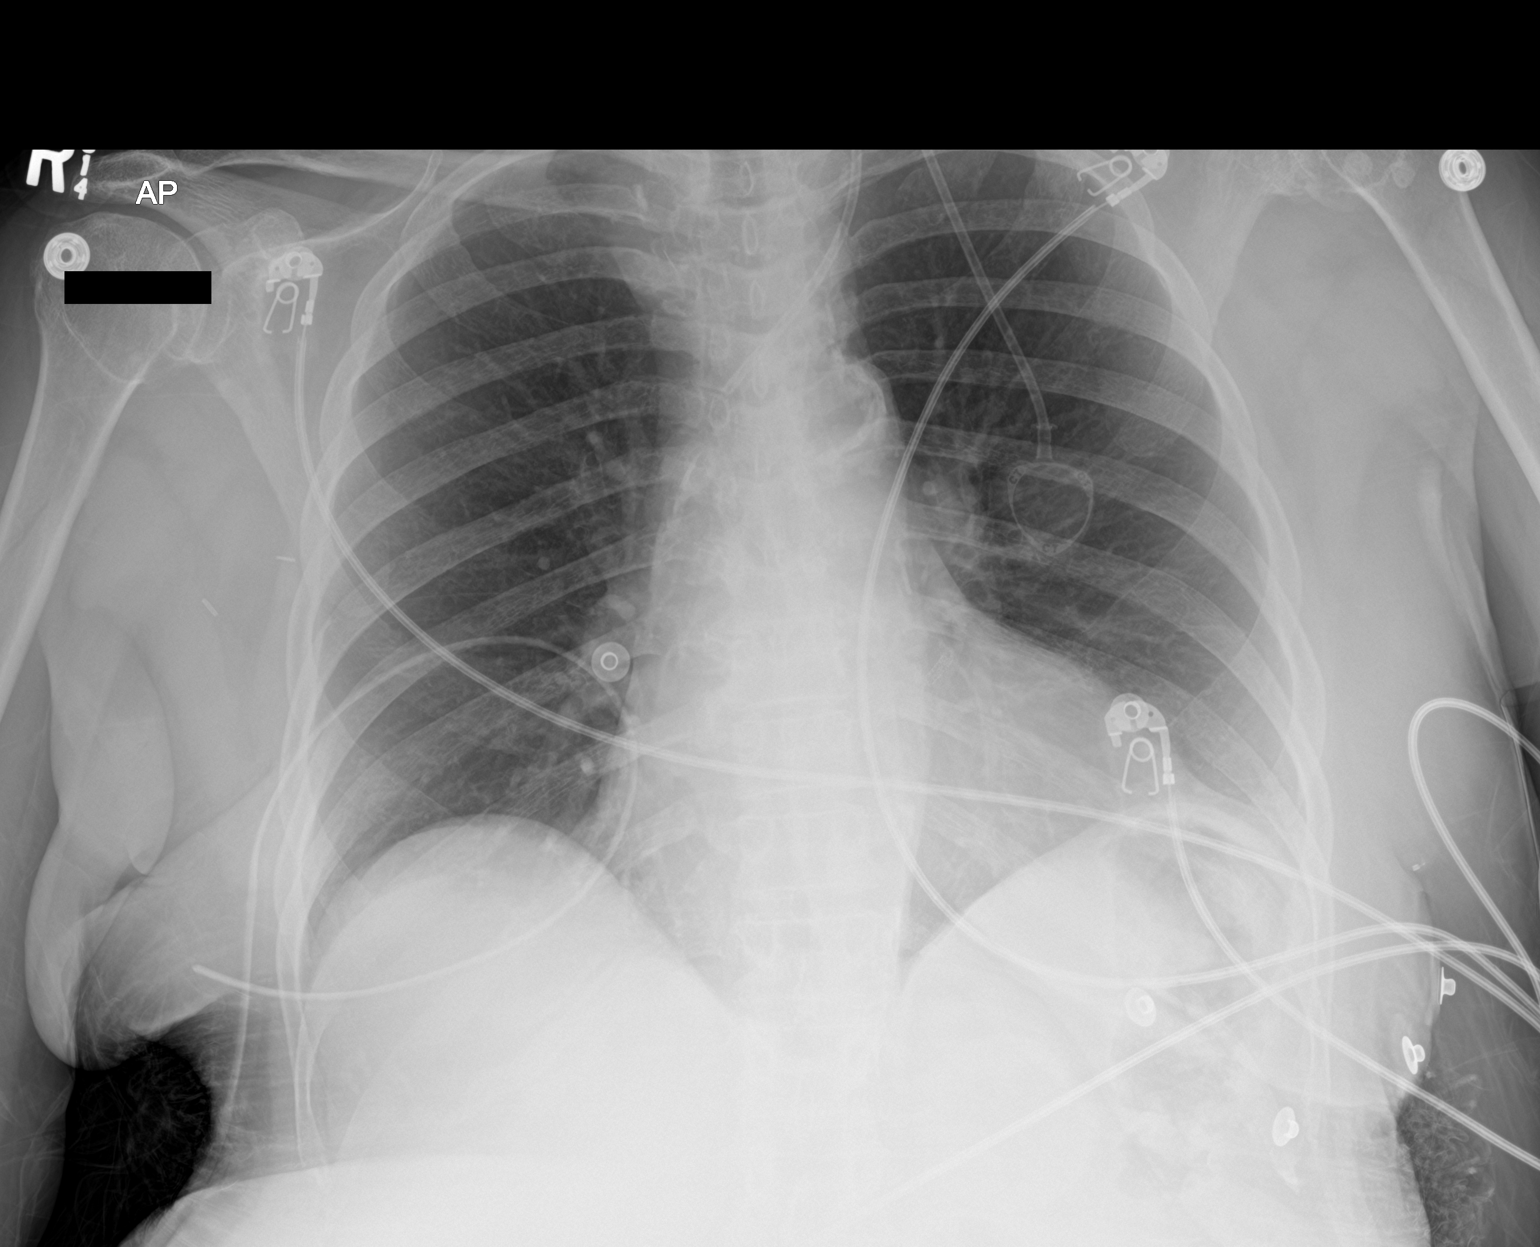

[2 of 2 positions shown; findings below may reference images not displayed]

FINDINGS: There is a left-sided Port-A-Cath with the tip projecting over the
confluence left subclavian vein. There is no focal parenchymal
opacity. There is no pleural effusion or pneumothorax. The heart and
mediastinal contours are unremarkable.

The osseous structures are unremarkable.
IMPRESSION: No active cardiopulmonary disease.

## 2020-08-31 ENCOUNTER — Other Ambulatory Visit: Payer: Self-pay | Admitting: Nurse Practitioner

## 2020-08-31 DIAGNOSIS — E1122 Type 2 diabetes mellitus with diabetic chronic kidney disease: Secondary | ICD-10-CM

## 2020-08-31 DIAGNOSIS — Z794 Long term (current) use of insulin: Secondary | ICD-10-CM

## 2020-09-01 ENCOUNTER — Telehealth: Payer: Self-pay

## 2020-09-01 NOTE — Telephone Encounter (Signed)
Returned pt's call. No answer LVM, RE: breast pain at surgical site.

## 2020-09-09 ENCOUNTER — Other Ambulatory Visit: Payer: Self-pay

## 2020-09-09 ENCOUNTER — Ambulatory Visit (INDEPENDENT_AMBULATORY_CARE_PROVIDER_SITE_OTHER): Payer: 59

## 2020-09-09 ENCOUNTER — Ambulatory Visit (HOSPITAL_COMMUNITY)
Admission: EM | Admit: 2020-09-09 | Discharge: 2020-09-09 | Disposition: A | Discharge status: Home / Self Care | Payer: 59 | Attending: Emergency Medicine | Admitting: Emergency Medicine

## 2020-09-09 ENCOUNTER — Encounter (HOSPITAL_COMMUNITY): Payer: Self-pay

## 2020-09-09 DIAGNOSIS — W19XXXA Unspecified fall, initial encounter: Secondary | ICD-10-CM

## 2020-09-09 DIAGNOSIS — M25532 Pain in left wrist: Secondary | ICD-10-CM

## 2020-09-09 DIAGNOSIS — M25512 Pain in left shoulder: Secondary | ICD-10-CM

## 2020-09-09 DIAGNOSIS — W109XXA Fall (on) (from) unspecified stairs and steps, initial encounter: Secondary | ICD-10-CM

## 2020-09-09 DIAGNOSIS — M25552 Pain in left hip: Secondary | ICD-10-CM | POA: Diagnosis not present

## 2020-09-09 DIAGNOSIS — S62015A Nondisplaced fracture of distal pole of navicular [scaphoid] bone of left wrist, initial encounter for closed fracture: Secondary | ICD-10-CM | POA: Diagnosis not present

## 2020-09-09 MED ORDER — KETOROLAC TROMETHAMINE 30 MG/ML IJ SOLN
INTRAMUSCULAR | Status: AC
  Filled 2020-09-09: qty 1

## 2020-09-09 MED ORDER — DICLOFENAC SODIUM 1 % EX GEL: 4.0000 g | Freq: Four times a day (QID) | CUTANEOUS | 1 refills | Status: DC

## 2020-09-09 MED ORDER — KETOROLAC TROMETHAMINE 30 MG/ML IJ SOLN
30.0000 mg | Freq: Once | INTRAMUSCULAR | Status: AC
  Administered 2020-09-09: 30 mg via INTRAMUSCULAR

## 2020-09-09 MED ORDER — ACETAMINOPHEN 500 MG PO TABS: 500.0000 mg | ORAL_TABLET | Freq: Four times a day (QID) | ORAL | 0 refills | Status: DC | PRN

## 2020-09-09 NOTE — ED Provider Notes (Signed)
[facility]    CSN: [id number] Arrival date & time: 09/09/20  1023      History   Chief Complaint Chief Complaint  Patient presents with   Fall    HPI [name] is a 70 y.o. female.   Patient presents with left hip,wrist, hand and shoulder pain after falling down stairs this morning landing on left side of body while attempting to feed dog. Uses cane/walker at baseline, was inv wheelchair at time of exam. Attest to being able to walk but body hurts. Painful to raise arm above head, pain when opening hand and soreness over hip. Denies numbness or tingling.   Past Medical History:  Diagnosis Date   Ambulates with cane    straight   Angina    no current problems per patient at PAT appt 11/05/18, nitroglycerin sl 06/30/20   Arthritis    HANDS"   Asthma    Cancer ([place]) 09/04/2017   Right Breast   Carpal tunnel syndrome, bilateral 11/26/2016   Cholelithiasis    Cocaine abuse ([place]) 07/2012   per E.R. drug screen   COPD (chronic obstructive pulmonary disease) ([place])    per patient   Coronary artery disease    Diabetes mellitus without mention of complication    type 2   Dysphagia    esophageal dysmotility on 03/2011 esophagram    Dyspnea    with exertion   Fatty liver disease, nonalcoholic 1517   on Imaging.    GERD (gastroesophageal reflux disease)    Headache    Hearing loss    bilateral - no hearing aids   History of colonic polyps 2009   adenomatous 2009, HP in 2009, 2011, 2013.    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   Wears dentures    full upper and partial lower   Wears glasses     Patient Active Problem List   Diagnosis Date Noted   ESRD (end stage renal disease) ([place]) 03/15/2020   Insulin dependent type 2 diabetes mellitus ([place]) 12/09/2018   COPD (chronic obstructive pulmonary disease) ([place]) 12/09/2018   Chronic  cholecystitis 11/11/2018   Hemobilia    Dilated bile duct    Jaundice    Elevated LFTs    Choledocholithiasis 10/02/2018   CKD (chronic kidney disease), stage III    Acute cholecystitis    Elevated liver enzymes 09/18/2018   Calculus of gallbladder and bile duct with acute cholecystitis, with obstruction    Melena    Unstable angina ([place]) 07/12/2018   Port-A-Cath in place 02/27/2018   Cancer of overlapping sites of right breast ([place]) 11/27/2017   Malignant neoplasm of upper-outer quadrant of right breast in female, estrogen receptor positive ([place]) 10/03/2017   Carpal tunnel syndrome, bilateral 11/26/2016   Ulnar neuropathy at elbow, 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   Abdominal pain, epigastric    Acute on chronic renal failure ([place]) 02/19/2015   Diarrhea    Nausea with vomiting    Hyperglycemia 04/01/2014   Hyperosmolar non-ketotic state in patient with type 2 diabetes mellitus ([place]) 04/01/2014   Nausea vomiting and diarrhea 04/01/2014   CAD (coronary artery disease) 04/01/2014   Dehydration    Diabetic ketoacidosis without coma associated with diabetes mellitus due to underlying condition ([place])    High anion gap metabolic acidosis    Cocaine abuse ([place])  Toxic encephalopathy-Unlikely metabolic,?psychogenic 08/02/2012   Bereavement 02/02/2012   Depression 02/02/2012   Iron deficiency anemia, unspecified 11/30/2011   Nonspecific abnormal finding in stool contents 11/30/2011   Benign neoplasm of colon 11/30/2011   Cough 05/14/2011   Chest pain 03/19/2011   Macrocytic anemia 03/19/2011   Type 2 diabetes mellitus with complication, without long-term current use of insulin ([place]) 03/19/2011   DOE (dyspnea on exertion) 03/19/2011   Chest pain at rest 02/18/2011    Class: Acute   Cholelithiasis 11/01/2010   DM 04/08/2009   HYPERLIPIDEMIA 04/08/2009   HTN (hypertension) 04/08/2009   MYOCARDIAL INFARCTION 04/08/2009   Coronary atherosclerosis 04/08/2009   GERD 04/08/2009   FATTY LIVER  DISEASE 04/08/2009   COLONIC POLYPS, HX OF 04/08/2009    Past Surgical History:  Procedure Laterality Date   ABDOMINAL ANGIOGRAM  02/20/2011   Procedure: ABDOMINAL ANGIOGRAM;  Surgeon: Clent Demark, MD;  Location: [facility] CATH LAB;  Service: Cardiovascular;;   BREAST BIOPSY Left    CARPAL TUNNEL RELEASE Right 07/04/2017   Procedure: RIGHT CARPAL TUNNEL RELEASE;  Surgeon: Leanora Cover, MD;  Location: [place];  Service: Orthopedics;  Laterality: Right;   CARPAL TUNNEL RELEASE Left 09/12/2017   Procedure: LEFT CARPAL TUNNEL RELEASE;  Surgeon: Leanora Cover, MD;  Location: [place];  Service: Orthopedics;  Laterality: Left;   CHOLECYSTECTOMY N/A 11/11/2018   Procedure: ATTEMPTED LAPAROSCOPIC CHOLECUSTECOMY,  OPEN CHOLECYSTECTOMY;  Surgeon: Coralie Keens, MD;  Location: [place];  Service: General;  Laterality: N/A;   COLONOSCOPY  11/30/2011   Procedure: COLONOSCOPY;  Surgeon: Lafayette Dragon, MD;  Location: [facility];  Service: Endoscopy;  Laterality: N/A;   CORONARY ANGIOPLASTY WITH STENT PLACEMENT  02/20/2011   ERCP N/A 10/03/2018   Procedure: ENDOSCOPIC RETROGRADE CHOLANGIOPANCREATOGRAPHY (ERCP);  Surgeon: Ladene Artist, MD;  Location: [facility];  Service: Endoscopy;  Laterality: N/A;   ESOPHAGOGASTRODUODENOSCOPY  11/30/2011   Procedure: ESOPHAGOGASTRODUODENOSCOPY (EGD);  Surgeon: Lafayette Dragon, MD;  Location: [facility];  Service: Endoscopy;  Laterality: N/A;   ESOPHAGOGASTRODUODENOSCOPY N/A 02/23/2015   Procedure: ESOPHAGOGASTRODUODENOSCOPY (EGD);  Surgeon: Gatha Mayer, MD;  Location[facility] ENDOSCOPY;  Service: Endoscopy;  Laterality: N/A;   EYE SURGERY Bilateral    cataracts removed   INTRAOPERATIVE CHOLANGIOGRAM N/A 11/11/2018   Procedure: Intraoperative Cholangiogram;  Surgeon: Coralie Keens, MD;  Location: [place];  Service: General;  Laterality: N/A;   IR REMOVAL TUN ACCESS W/ PORT W/O FL MOD SED  06/30/2020   LEFT HEART CATHETERIZATION WITH  CORONARY ANGIOGRAM N/A 02/20/2011   Procedure: LEFT HEART CATHETERIZATION WITH CORONARY ANGIOGRAM;  Surgeon: Clent Demark, MD;  Location: [facility] CATH LAB;  Service: Cardiovascular;  Laterality: N/A;   MASTECTOMY Right 11/05/2017   MASTECTOMY W/ SENTINEL NODE BIOPSY Right 11/27/2017   MASTECTOMY W/ SENTINEL NODE BIOPSY Right 11/27/2017   Procedure: RIGHT TOTAL MASTECTOMY WITH SENTINEL LYMPH NODE BIOPSY;  Surgeon: Excell Seltzer, MD;  Location: [facility];  Service: General;  Laterality: Right;   PORT A CATH REVISION Left 05/07/2018   Procedure: PORT A CATH REVISION;  Surgeon: Excell Seltzer, MD;  Location: [facility];  Service: General;  Laterality: Left;   PORTACATH PLACEMENT Left 11/27/2017   Procedure: INSERTION PORT-A-CATH;  Surgeon: Excell Seltzer, MD;  Location: [facility];  Service: General;  Laterality: Left;   RIGHT COLECTOMY  02/2007   for post polypectomy colonic perforation   SPHINCTEROTOMY  10/03/2018   Procedure: SPHINCTEROTOMY;  Surgeon: Ladene Artist, MD;  Location: [facility];  Service: Endoscopy;;   TUBAL LIGATION      OB History   No obstetric history on file.      Home Medications    Prior to Admission medications   Medication Sig Start Date End Date Taking? Authorizing Provider  albuterol (PROVENTIL) (2.5 MG/3ML) 0.083% nebulizer solution Take 2.5 mg by nebulization every 6 (six) hours as needed for wheezing or shortness of breath.    [provider]  ALPRAZolam Duanne Moron) 0.25 MG tablet Take 0.25 mg by mouth at bedtime.    [provider]  amLODipine (NORVASC) 5 MG tablet Take 1 tablet (5 mg total) by mouth daily. 03/15/20   Gildardo Pounds, NP  anastrozole (ARIMIDEX) 1 MG tablet TAKE ONE TABLET BY MOUTH DAILY 01/19/20   Nicholas Lose, MD  aspirin 81 MG EC tablet Take 1 tablet (81 mg total) by mouth daily. 10/13/18   Pokhrel, Corrie Mckusick, MD  atorvastatin (LIPITOR) 20 MG tablet Take 1 tablet (20 mg total) by mouth daily at 6 PM. 03/15/20 06/13/20  Gildardo Pounds, NP  budesonide-formoterol (SYMBICORT) 160-4.5 MCG/ACT inhaler Inhale 1 puff into the lungs 2 (two) times daily as needed (respiratory issues.). 03/15/20   Gildardo Pounds, NP  diclofenac Sodium (VOLTAREN) 1 % GEL Apply 4 g topically 4 (four) times daily. 08/14/20   Tareva Leske, Leitha Schuller, NP  Insulin Pen Needle (TRUEPLUS PEN NEEDLES) 31G X 8 MM MISC Inject into the skin once nightly.  E11.65 03/31/20   Gildardo Pounds, NP  LANTUS SOLOSTAR 100 UNIT/ML Solostar Pen Inject 5 Units into the skin at bedtime. E11.65 03/15/20 06/13/20  Gildardo Pounds, NP  losartan (COZAAR) 25 MG tablet Take 1 tablet (25 mg total) by mouth daily. 06/29/20 09/27/20  Gildardo Pounds, NP  metoprolol succinate (TOPROL-XL) 50 MG 24 hr tablet Take 1 tablet (50 mg total) by mouth daily. Take with or immediately following a meal. 03/15/20   Gildardo Pounds, NP  Misc. Devices MISC Please provide patient with nebulizer mask and tubing. [id number] 12/14/19   Gildardo Pounds, NP  Multiple Vitamin (MULTIVITAMIN WITH MINERALS) TABS tablet Take 1 tablet by mouth daily. [place]    [provider]  nitroGLYCERIN (NITROSTAT) 0.4 MG SL tablet Place 1 tablet (0.4 mg total) under the tongue every 5 (five) minutes as needed for chest pain. 03/15/20   Gildardo Pounds, NP  Omega-3 Fatty Acids (FISH OIL) 1000 MG CAPS Take 1,000 mg by mouth daily.    [provider]  pantoprazole (PROTONIX) 40 MG tablet Take 1 tablet (40 mg total) by mouth daily. 03/15/20 06/13/20  Gildardo Pounds, NP  traMADol (ULTRAM) 50 MG tablet Take 1 tablet (50 mg total) by mouth every 6 (six) hours as needed for moderate pain. 11/18/18   Kalman Drape, PA  VICTOZA 18 MG/3ML SOPN INJECT 1.8 MG INTO TO SKIN DAILY 08/31/20   Gildardo Pounds, NP    Family History Family History  Problem Relation Age of Onset   Heart disease Mother    Diabetes Mother    Cancer Father        unsure what kind   Diabetes Sister    Colon cancer Neg Hx    Breast cancer Neg Hx      Social History Social History   Tobacco Use   Smoking status: Every Day    Packs/day: 0.50    Years: 47.00    Pack years: 23.50    Types: Cigarettes    Last  attempt to quit: 05/09/2010    Years since quitting: 10.3   Smokeless tobacco: Never  Vaping Use   Vaping Use: Never used  Substance Use Topics   Alcohol use: Yes   Drug use: Not Currently    Types: Cocaine    Comment: positive drug screen (cocaine, benzo's)07/2012 in emergency room     Allergies   Patient has no known allergies.   Review of Systems Review of Systems  Constitutional: Negative.   Respiratory: Negative.    Musculoskeletal:  Positive for gait problem and joint swelling. Negative for arthralgias, back pain, myalgias, neck pain and neck stiffness.  Skin: Negative.     Physical Exam Triage Vital Signs ED Triage Vitals  Enc Vitals Group     BP 09/09/20 1053 (!) 146/64     Pulse Rate 09/09/20 1053 80     Resp 09/09/20 1053 20     Temp 09/09/20 1053 98.4 F (36.9 C)     Temp Source 09/09/20 1053 Oral     SpO2 09/09/20 1053 100 %     Weight --      Height --      Head Circumference --      Peak Flow --      Pain Score 09/09/20 1055 9     Pain Loc --      Pain Edu? --      Excl. in [place]? --    No data found.  Updated Vital Signs BP (!) 146/64 (BP Location: Right Arm)   Pulse 80   Temp 98.4 F (36.9 C) (Oral)   Resp 20   LMP  (LMP Unknown) Comment: tubal ligation  SpO2 100%   Visual Acuity Right Eye Distance:   Left Eye Distance:   Bilateral Distance:    Right Eye Near:   Left Eye Near:    Bilateral Near:     Physical Exam Constitutional:      Appearance: Normal appearance. She is normal weight.  Eyes:     Extraocular Movements: Extraocular movements intact.  Pulmonary:     Effort: Pulmonary effort is normal.  Musculoskeletal:     Comments: Limited Range of motion, able to flex arm to 90 degrees, able to abduct arm to 90 degrees, higher elicits pain, no point tenderness  noted, mild ecchymosis on lateral of shoulder  Swelling and tenderness at base of thumb over palmer side, able to flex and extend thumb, pain elicited with wrist rotation, no point tenderness or swelling of wrist noted,   Tenderness over entirety of hip, no point tenderness, no swelling or ecchymosis noted. ROM intact but elicits discomfort   Neurological:     Mental Status: She is alert and oriented to person, place, and time. Mental status is at baseline.  Psychiatric:        Mood and Affect: Mood normal.        Behavior: Behavior normal.     UC Treatments / Results  Labs (all labs ordered are listed, but only abnormal results are displayed) Labs Reviewed - No data to display  EKG   Radiology No results found.  Procedures Procedures (including critical care time)  Medications Ordered in UC Medications - No data to display  Initial Impression / Assessment and Plan / UC Course  I have reviewed the triage vital signs and the nursing notes.  Pertinent labs & imaging results that were available during my care of the patient were reviewed by me and considered in my medical decision  making (see chart for details).  Closed nondisplaced fracture of scaphoid bone left wrist Acute left hip pain  Acute pain of left shoulder  Radial gutter placed, neurovascularly intact prior to and after placement Diclofenac 1% gel 4 times a day prn Tylenol 500 mg every 6 hours prn Toradol 30 mg IM now  Ortho follow up 2 weeks  Final Clinical Impressions(s) / UC Diagnoses   Final diagnoses:  Fall   Discharge Instructions   None    ED Prescriptions   None    PDMP not reviewed this encounter.   Hans Eden, NP 09/09/20 1527

## 2020-09-09 NOTE — Discharge Instructions (Addendum)
Your wrist x-ray showed an avulsion (split in the bone) fracture at the distal scaphoid bone of your wrist  Your hip and shoulder x-ray were negative for injury  Keep splint on wrist until seen by orthopedic specialist, follow up in 2 weeks for evaluation, can go to either practice listed   Can use gel over hip and shoulder 4 times a day as needed  Can use tylenol every 6 hours to help with pain   Can apply warm compresses to affected area for additional comfort

## 2020-09-09 NOTE — ED Triage Notes (Signed)
Pt presents with left shoulder, left arm, left hand, and left hip pain after falling down steep steps while outside this morning.

## 2020-09-09 NOTE — Progress Notes (Signed)
Orthopedic Tech Progress Note Patient Details:  IVYONNA HOELZEL 1950/07/22 [id number]   Ortho Devices Type of Ortho Device: Rad Gutter splint Ortho Device/Splint Location: LUE Ortho Device/Splint Interventions: Application, Ordered   Post Interventions Patient Tolerated: Fair Instructions Provided: Care of device, Adjustment of device  Viana Sleep Jeri Modena 09/09/2020, 1:07 PM

## 2020-09-12 NOTE — Progress Notes (Signed)
Patient Care Team: Gildardo Pounds, NP as PCP - General (Nurse Practitioner) Nicholas Lose, MD as Consulting Physician (Hematology and Oncology)  DIAGNOSIS:    ICD-10-CM   1. Malignant neoplasm of upper-outer quadrant of right breast in female, estrogen receptor positive ([place])  C50.411    Z17.0       SUMMARY OF ONCOLOGIC HISTORY: Oncology History  Malignant neoplasm of upper-outer quadrant of right breast in female, estrogen receptor positive ([place])  09/03/2017 Initial Diagnosis   Screening mammogram detected 1.4 cm right breast mass at 10 o'clock position, additional pleomorphic calcifications 2 cm posteriorly, additional loosely grouped calcifications 10.1 x 7.1 x 5.3 cm, single right axillary lymph node: Biopsy revealed DCIS ER 100%, PR 50%; IDC grade 2-3 ER 100%, PR 50%, Ki-67 15%, HER-2 positive ratio 2, copy #5, lymph node negative T2 N0 stage Ia clinical stage AJCC 8    09/03/2017 Cancer Staging   Staging form: Breast, AJCC 8th Edition - Clinical stage from 09/03/2017: Stage IB (cT2, cN0, cM0, G3, ER+, PR+, HER2+)   11/27/2017 Surgery   Right mastectomy ([place]) ([id number]): IDC grade 2, 1.6 cm, separate foci of DCIS intermediate grade, margins negative, 1/5 LN positive for micrometasatic carcinoma. ER 100%, PR 50%, Ki-67 15%, HER-2 positive ratio 2, copy #5 T1c N0 stage Ia   11/27/2017 Cancer Staging   Staging form: Breast, AJCC 8th Edition - Pathologic stage from 11/27/2017: Stage IA (pT1c, pN0, cM0, G2, ER+, PR+, HER2+)     02/07/2018 - 03/12/2019 Chemotherapy   trastuzumab (HERCEPTIN) 483 mg in sodium chloride 0.9 % 250 mL chemo infusion, 8 mg/kg = 483 mg, Intravenous,  Once, 11 of 11 cycles. Administration: 483 mg (02/07/2018), 357 mg (02/27/2018), 357 mg (05/01/2018), 357 mg (05/23/2018), 357 mg (06/12/2018), 357 mg (07/03/2018), 357 mg (08/14/2018), 357 mg (09/04/2018), 357 mg (10/17/2018), 357 mg (03/20/2018), 357 mg (04/10/2018)  trastuzumab-anns (KANJINTI) 357 mg in sodium chloride  0.9 % 250 mL chemo infusion, 6 mg/kg = 357 mg (100 % of original dose 6 mg/kg), Intravenous,  Once, 7 of 7 cycles. Dose modification: 6 mg/kg (original dose 6 mg/kg, Cycle 12, Reason: Other (see comments)). Administration: 357 mg (11/06/2018), 357 mg (11/27/2018), 357 mg (12/18/2018), 357 mg (01/08/2019), 357 mg (01/29/2019), 357 mg (02/19/2019), 357 mg (03/12/2019).   12/2018 - 12/2023 Anti-estrogen oral therapy   Anastrozole     CHIEF COMPLIANT: Follow-up of right breast cancer on anastrozole  INTERVAL HISTORY: [name] is a 70 y.o. with above-mentioned history of right breast cancer who underwent a right mastectomy, Herceptin maintenance, and anti-estrogen therapy with anastrozole. Mammogram on 01/08/20 showed no evidence of malignancy. She presents to the clinic today for follow-up.   ALLERGIES:  has No Known Allergies.  MEDICATIONS:  Current Outpatient Medications  Medication Sig Dispense Refill   acetaminophen (TYLENOL) 500 MG tablet Take 1 tablet (500 mg total) by mouth every 6 (six) hours as needed. 30 tablet 0   albuterol (PROVENTIL) (2.5 MG/3ML) 0.083% nebulizer solution Take 2.5 mg by nebulization every 6 (six) hours as needed for wheezing or shortness of breath.     ALPRAZolam (XANAX) 0.25 MG tablet Take 0.25 mg by mouth at bedtime.     amLODipine (NORVASC) 5 MG tablet Take 1 tablet (5 mg total) by mouth daily. 90 tablet 0   anastrozole (ARIMIDEX) 1 MG tablet TAKE ONE TABLET BY MOUTH DAILY 90 tablet 0   aspirin 81 MG EC tablet Take 1 tablet (81 mg total) by mouth daily. 30 tablet 0  atorvastatin (LIPITOR) 20 MG tablet Take 1 tablet (20 mg total) by mouth daily at 6 PM. 90 tablet 1   budesonide-formoterol (SYMBICORT) 160-4.5 MCG/ACT inhaler Inhale 1 puff into the lungs 2 (two) times daily as needed (respiratory issues.). 1 each 6   diclofenac Sodium (VOLTAREN) 1 % GEL Apply 4 g topically 4 (four) times daily. 100 g 1   Insulin Pen Needle (TRUEPLUS PEN NEEDLES) 31G X 8 MM MISC  Inject into the skin once nightly.  E11.65 100 each 2   LANTUS SOLOSTAR 100 UNIT/ML Solostar Pen Inject 5 Units into the skin at bedtime. E11.65 6 mL 1   losartan (COZAAR) 25 MG tablet Take 1 tablet (25 mg total) by mouth daily. 90 tablet 0   metoprolol succinate (TOPROL-XL) 50 MG 24 hr tablet Take 1 tablet (50 mg total) by mouth daily. Take with or immediately following a meal. 90 tablet 1   Misc. Devices MISC Please provide patient with nebulizer mask and tubing. ICD-10J44.9 1 each 0   Multiple Vitamin (MULTIVITAMIN WITH MINERALS) TABS tablet Take 1 tablet by mouth daily. [place]     nitroGLYCERIN (NITROSTAT) 0.4 MG SL tablet Place 1 tablet (0.4 mg total) under the tongue every 5 (five) minutes as needed for chest pain. 10 tablet 0   Omega-3 Fatty Acids (FISH OIL) 1000 MG CAPS Take 1,000 mg by mouth daily.     pantoprazole (PROTONIX) 40 MG tablet Take 1 tablet (40 mg total) by mouth daily. 90 tablet 0   traMADol (ULTRAM) 50 MG tablet Take 1 tablet (50 mg total) by mouth every 6 (six) hours as needed for moderate pain. 30 tablet 0   VICTOZA 18 MG/3ML SOPN INJECT 1.8 MG INTO TO SKIN DAILY 9 mL 2   No current facility-administered medications for this visit.    PHYSICAL EXAMINATION: ECOG PERFORMANCE STATUS: 1 - Symptomatic but completely ambulatory  Vitals:   09/13/20 1505  BP: 114/85  Pulse: 89  Temp: 97.8 F (36.6 C)  SpO2: 100%   Filed Weights   09/13/20 1505  Weight: 128 lb 1.6 oz (58.1 kg)    BREAST: No palpable masses or nodules in either right or left breasts. No palpable axillary supraclavicular or infraclavicular adenopathy no breast tenderness or nipple discharge. (exam performed in the presence of a chaperone)  LABORATORY DATA:  I have reviewed the data as listed CMP Latest Ref Rng & Units 03/15/2020 09/11/2019 01/29/2019  Glucose 65 - 99 mg/dL 92 171(H) 254(H)  BUN 8 - 27 mg/dL 16 21 33(H)  Creatinine 0.57 - 1.00 mg/dL 1.65(H) 1.62(H) 2.25(H)  Sodium 134 - 144 mmol/L 142  138 136  Potassium 3.5 - 5.2 mmol/L 4.6 5.1 4.8  Chloride 96 - 106 mmol/L 103 99 102  CO2 20 - 29 mmol/L 21 23 24   Calcium 8.7 - 10.3 mg/dL 10.7(H) 10.4(H) 9.4  Total Protein 6.0 - 8.5 g/dL 8.3 8.4 8.5(H)  Total Bilirubin 0.0 - 1.2 mg/dL 0.5 0.6 0.6  Alkaline Phos 44 - 121 IU/L 111 140(H) 120  AST 0 - 40 IU/L 12 22 16   ALT 0 - 32 IU/L 13 18 18     Lab Results  Component Value Date   WBC 5.4 03/15/2020   HGB 14.0 03/15/2020   HCT 41.9 03/15/2020   MCV 92 03/15/2020   PLT 283 03/15/2020   NEUTROABS 4.0 03/12/2019    ASSESSMENT & PLAN:  Malignant neoplasm of upper-outer quadrant of right breast in female, estrogen receptor positive ([place]) positive ([place]) 11/27/2017  right mastectomy: IDC grade 2, 1.6 cm, separate foci of DCIS intermediate grade, margins negative, 0/5 lymph nodes negative,ER 100%, PR 50%, Ki-67 15%, HER-2 positive ratio 2, copy #5 T1c N0 stage Ia   Treatment plan: 1.  Adjuvant Herceptin every 3 weeks for 1 year (because of poor performance status Taxol is felt to be intolerable for her), started January 2020 -03/12/2019 2.  Adjuvant antiestrogen therapy with Anastrozole 1 mg daily started 02/27/2018   Patient's daughter is the primary point of contact for her and all appointment changes will need to go through her. -------------------------------------------------------------------------------------- Hospitalization 10/02/2018-10/07/2018: Acute cholecystitis Hospitalization: 11/11/2018 through 11/18/2018: Chronic cholecystitis with gallstones: Cholecystectomy Hospitalization: 12/07/2020 12/09/2018: Chest pain, VQ scan negative for PE, the predictable chest pain treated with Lidoderm patch   Anastrozole toxicities: Denies any major adverse effects to anastrozole therapy   Breast cancer surveillance: 09/13/2020: Breast examination: Right mastectomy: Scar appears to be tender but no palpable lumps or nodules, left breast no palpable lumps or nodules. 01/12/2020: Left breast  mammogram: Benign     Return to clinic in 1 year for follow-up    No orders of the defined types were placed in this encounter.  The patient has a good understanding of the overall plan. she agrees with it. she will call with any problems that may develop before the next visit here.  Total time spent: 20 mins including face to face time and time spent for planning, charting and coordination of care  Rulon Eisenmenger, MD, MPH 09/13/2020  I, Thana Ates, am acting as scribe for Dr. Nicholas Lose.  I have reviewed the above documentation for accuracy and completeness, and I agree with the above.

## 2020-09-13 ENCOUNTER — Other Ambulatory Visit: Payer: Self-pay

## 2020-09-13 ENCOUNTER — Inpatient Hospital Stay: Payer: 59 | Attending: Hematology and Oncology | Admitting: Hematology and Oncology

## 2020-09-13 DIAGNOSIS — Z170 Estrogen receptor positive status [ER+]: Secondary | ICD-10-CM

## 2020-09-13 DIAGNOSIS — C50411 Malignant neoplasm of upper-outer quadrant of right female breast: Secondary | ICD-10-CM

## 2020-09-13 DIAGNOSIS — Z79899 Other long term (current) drug therapy: Secondary | ICD-10-CM | POA: Diagnosis not present

## 2020-09-13 DIAGNOSIS — Z79811 Long term (current) use of aromatase inhibitors: Secondary | ICD-10-CM | POA: Diagnosis not present

## 2020-09-13 MED ORDER — ANASTROZOLE 1 MG PO TABS: 1.0000 mg | ORAL_TABLET | Freq: Every day | ORAL | 3 refills | Status: DC

## 2020-09-13 NOTE — Assessment & Plan Note (Signed)
positive ([place]) 11/27/2017 right mastectomy: IDC grade 2, 1.6 cm, separate foci of DCIS intermediate grade, margins negative, 0/5 lymph nodes negative,ER 100%, PR 50%, Ki-67 15%, HER-2 positive ratio 2, copy #5 T1c N0 stage Ia  Treatment plan: 1. Adjuvant Herceptin every 3 weeks for 1 year (because of poor performance status Taxol is felt to be intolerable for her), started January 2020-03/12/2019 2. Adjuvant antiestrogen therapy withAnastrozole 81m daily started 02/27/2018  Patient's daughter is the primary point of contact for her and all appointment changes will need to go through her. -------------------------------------------------------------------------------------- Hospitalization 10/02/2018-10/07/2018: Acute cholecystitis Hospitalization: 11/11/2018 through 11/18/2018: Chronic cholecystitis with gallstones: Cholecystectomy Hospitalization: 12/07/2020 12/09/2018: Chest pain, VQ scan negative for PE, the predictable chest pain treated with Lidoderm patch  Anastrozole toxicities: Denies any major adverse effects to anastrozole therapy  Breast cancer surveillance: 09/13/2020: Breast examination: Right mastectomy: Scar appears to be tender but no palpable lumps or nodules, left breast no palpable lumps or nodules. 01/12/2020: Left breast mammogram: I ordered a new mammogram to be done on the left breast.     Return to clinic in 1 year for follow-up

## 2020-09-20 ENCOUNTER — Ambulatory Visit: Payer: 59 | Admitting: Nurse Practitioner

## 2020-10-05 ENCOUNTER — Other Ambulatory Visit: Payer: Self-pay | Admitting: Nurse Practitioner

## 2020-10-05 DIAGNOSIS — I10 Essential (primary) hypertension: Secondary | ICD-10-CM

## 2020-10-05 NOTE — Telephone Encounter (Signed)
Requested medication (s) are due for refill today: Yes  Requested medication (s) are on the active medication list: Yes  Last refill:  06/29/20  Future visit scheduled: Yes  Notes to clinic:  Pt. Needs labs.    Requested Prescriptions  Pending Prescriptions Disp Refills   losartan (COZAAR) 25 MG tablet [Pharmacy Med Name: losartan 25 mg tablet] 90 tablet 0    Sig: TAKE ONE TABLET BY MOUTH DAILY     Cardiovascular:  Angiotensin Receptor Blockers Failed - 10/05/2020  8:02 AM      Failed - Cr in normal range and within 180 days    Creatinine  Date Value Ref Range Status  01/29/2019 2.25 (H) 0.44 - 1.00 mg/dL Final   Creatinine, Ser  Date Value Ref Range Status  03/15/2020 1.65 (H) 0.57 - 1.00 mg/dL Final          Failed - K in normal range and within 180 days    Potassium  Date Value Ref Range Status  03/15/2020 4.6 3.5 - 5.2 mmol/L Final          Passed - Patient is not pregnant      Passed - Last BP in normal range    BP Readings from Last 1 Encounters:  09/13/20 114/85          Passed - Valid encounter within last 6 months    Recent Outpatient Visits           1 month ago Stage 3b chronic kidney disease ([place])   [place], [place] W, NP   6 months ago Type 2 diabetes mellitus with stage 3b chronic kidney disease, with long-term current use of insulin ([place])   [place], [place] W, NP   8 months ago Insulin dependent type 2 diabetes mellitus [facility])   [place], Annie Main L, RPH-CPP   8 months ago Insulin dependent type 2 diabetes mellitus [facility])   [place], Annie Main L, RPH-CPP   9 months ago Insulin dependent type 2 diabetes mellitus [facility])   [place], RPH-CPP       Future Appointments             In 1 month Gildardo Pounds, NP [place]

## 2020-10-06 ENCOUNTER — Telehealth: Payer: Self-pay | Admitting: Nurse Practitioner

## 2020-10-06 NOTE — Telephone Encounter (Signed)
Copied from [place] [phone]. Topic: General - Other >> Oct 06, 2020 10:46 AM Lennox Solders wrote: reason for CRM: Pt is calling and [place] kidney specialist is needing more recent blood work to stage the patient for an appt. Pt last blood work was feb 2022. Pt has an appt with zelda in oct 222

## 2020-10-06 NOTE — Telephone Encounter (Signed)
Can patient come in for just lab work so that her referral can be placed.

## 2020-10-07 ENCOUNTER — Other Ambulatory Visit: Payer: Self-pay | Admitting: Nurse Practitioner

## 2020-10-07 DIAGNOSIS — N1832 Chronic kidney disease, stage 3b: Secondary | ICD-10-CM

## 2020-10-07 DIAGNOSIS — Z794 Long term (current) use of insulin: Secondary | ICD-10-CM

## 2020-10-07 NOTE — Telephone Encounter (Signed)
Labs have been ordered

## 2020-10-11 ENCOUNTER — Encounter: Payer: Self-pay | Admitting: Hematology and Oncology

## 2020-10-11 NOTE — Telephone Encounter (Signed)
Pt was called and a VM was left informing patient to return phone call to set a lab appointment.

## 2020-10-13 ENCOUNTER — Other Ambulatory Visit: Payer: Self-pay

## 2020-10-13 ENCOUNTER — Ambulatory Visit: Payer: Medicare Other | Attending: Nurse Practitioner

## 2020-10-13 DIAGNOSIS — N1832 Chronic kidney disease, stage 3b: Secondary | ICD-10-CM | POA: Diagnosis not present

## 2020-10-13 DIAGNOSIS — E1122 Type 2 diabetes mellitus with diabetic chronic kidney disease: Secondary | ICD-10-CM

## 2020-10-13 DIAGNOSIS — Z794 Long term (current) use of insulin: Secondary | ICD-10-CM | POA: Diagnosis not present

## 2020-10-14 LAB — CMP14+EGFR
ALT: 11 IU/L (ref 0–32)
AST: 14 IU/L (ref 0–40)
Albumin/Globulin Ratio: 1.2 (ref 1.2–2.2)
Albumin: 4.7 g/dL (ref 3.7–4.7)
Alkaline Phosphatase: 118 IU/L (ref 44–121)
BUN/Creatinine Ratio: 11 — ABNORMAL LOW (ref 12–28)
BUN: 26 mg/dL (ref 8–27)
Bilirubin Total: 0.8 mg/dL (ref 0.0–1.2)
CO2: 25 mmol/L (ref 20–29)
Calcium: 10.7 mg/dL — ABNORMAL HIGH (ref 8.7–10.3)
Chloride: 101 mmol/L (ref 96–106)
Creatinine, Ser: 2.36 mg/dL — ABNORMAL HIGH (ref 0.57–1.00)
Globulin, Total: 3.9 g/dL (ref 1.5–4.5)
Glucose: 93 mg/dL (ref 65–99)
Potassium: 4.8 mmol/L (ref 3.5–5.2)
Sodium: 140 mmol/L (ref 134–144)
Total Protein: 8.6 g/dL — ABNORMAL HIGH (ref 6.0–8.5)
eGFR: 21 mL/min/{1.73_m2} — ABNORMAL LOW (ref >59)

## 2020-10-14 LAB — HEMOGLOBIN A1C
Est. average glucose Bld gHb Est-mCnc: 123 mg/dL
Hgb A1c MFr Bld: 5.9 % — ABNORMAL HIGH (ref 4.8–5.6)

## 2020-10-15 DIAGNOSIS — M25512 Pain in left shoulder: Secondary | ICD-10-CM | POA: Diagnosis not present

## 2020-10-21 DIAGNOSIS — M25512 Pain in left shoulder: Secondary | ICD-10-CM | POA: Diagnosis not present

## 2020-10-30 ENCOUNTER — Other Ambulatory Visit: Payer: Self-pay | Admitting: Nurse Practitioner

## 2020-10-30 DIAGNOSIS — I10 Essential (primary) hypertension: Secondary | ICD-10-CM

## 2020-10-30 NOTE — Telephone Encounter (Signed)
Next OV 11/08/20   Approved per protocol.

## 2020-10-31 ENCOUNTER — Other Ambulatory Visit: Payer: Self-pay | Admitting: Nurse Practitioner

## 2020-10-31 DIAGNOSIS — I10 Essential (primary) hypertension: Secondary | ICD-10-CM

## 2020-11-04 DIAGNOSIS — E119 Type 2 diabetes mellitus without complications: Secondary | ICD-10-CM | POA: Diagnosis not present

## 2020-11-04 DIAGNOSIS — I10 Essential (primary) hypertension: Secondary | ICD-10-CM | POA: Diagnosis not present

## 2020-11-04 DIAGNOSIS — M109 Gout, unspecified: Secondary | ICD-10-CM | POA: Diagnosis not present

## 2020-11-04 DIAGNOSIS — E785 Hyperlipidemia, unspecified: Secondary | ICD-10-CM | POA: Diagnosis not present

## 2020-11-07 IMAGING — CR DG CHEST 2V
2 series · 2 of 2 positions shown · non-contrast
Comparison: CT 12/18/2017.

CLINICAL DATA: Port-A-Cath.

EXAM:
CHEST - 2 VIEW

[w chest pa]
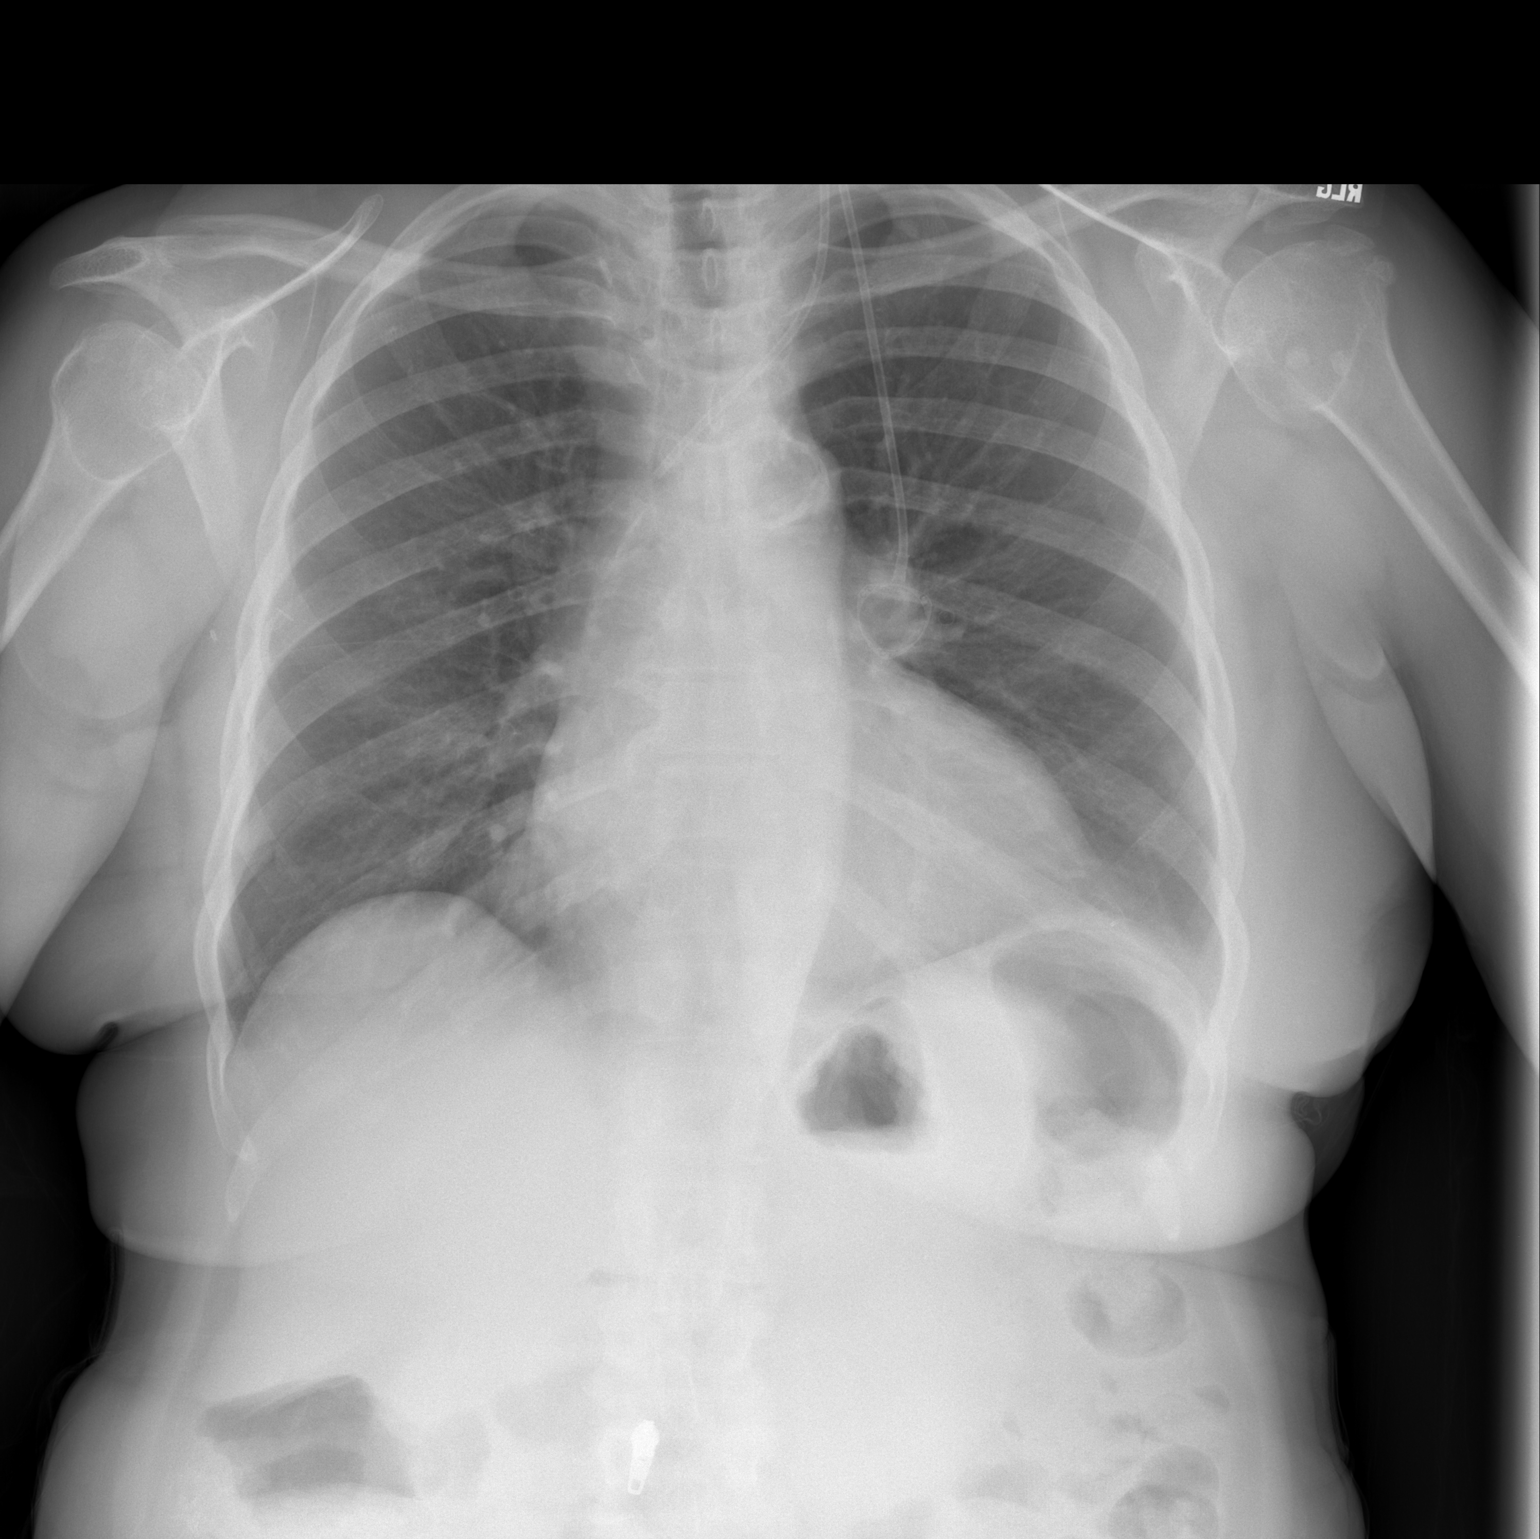

[w chest lat]
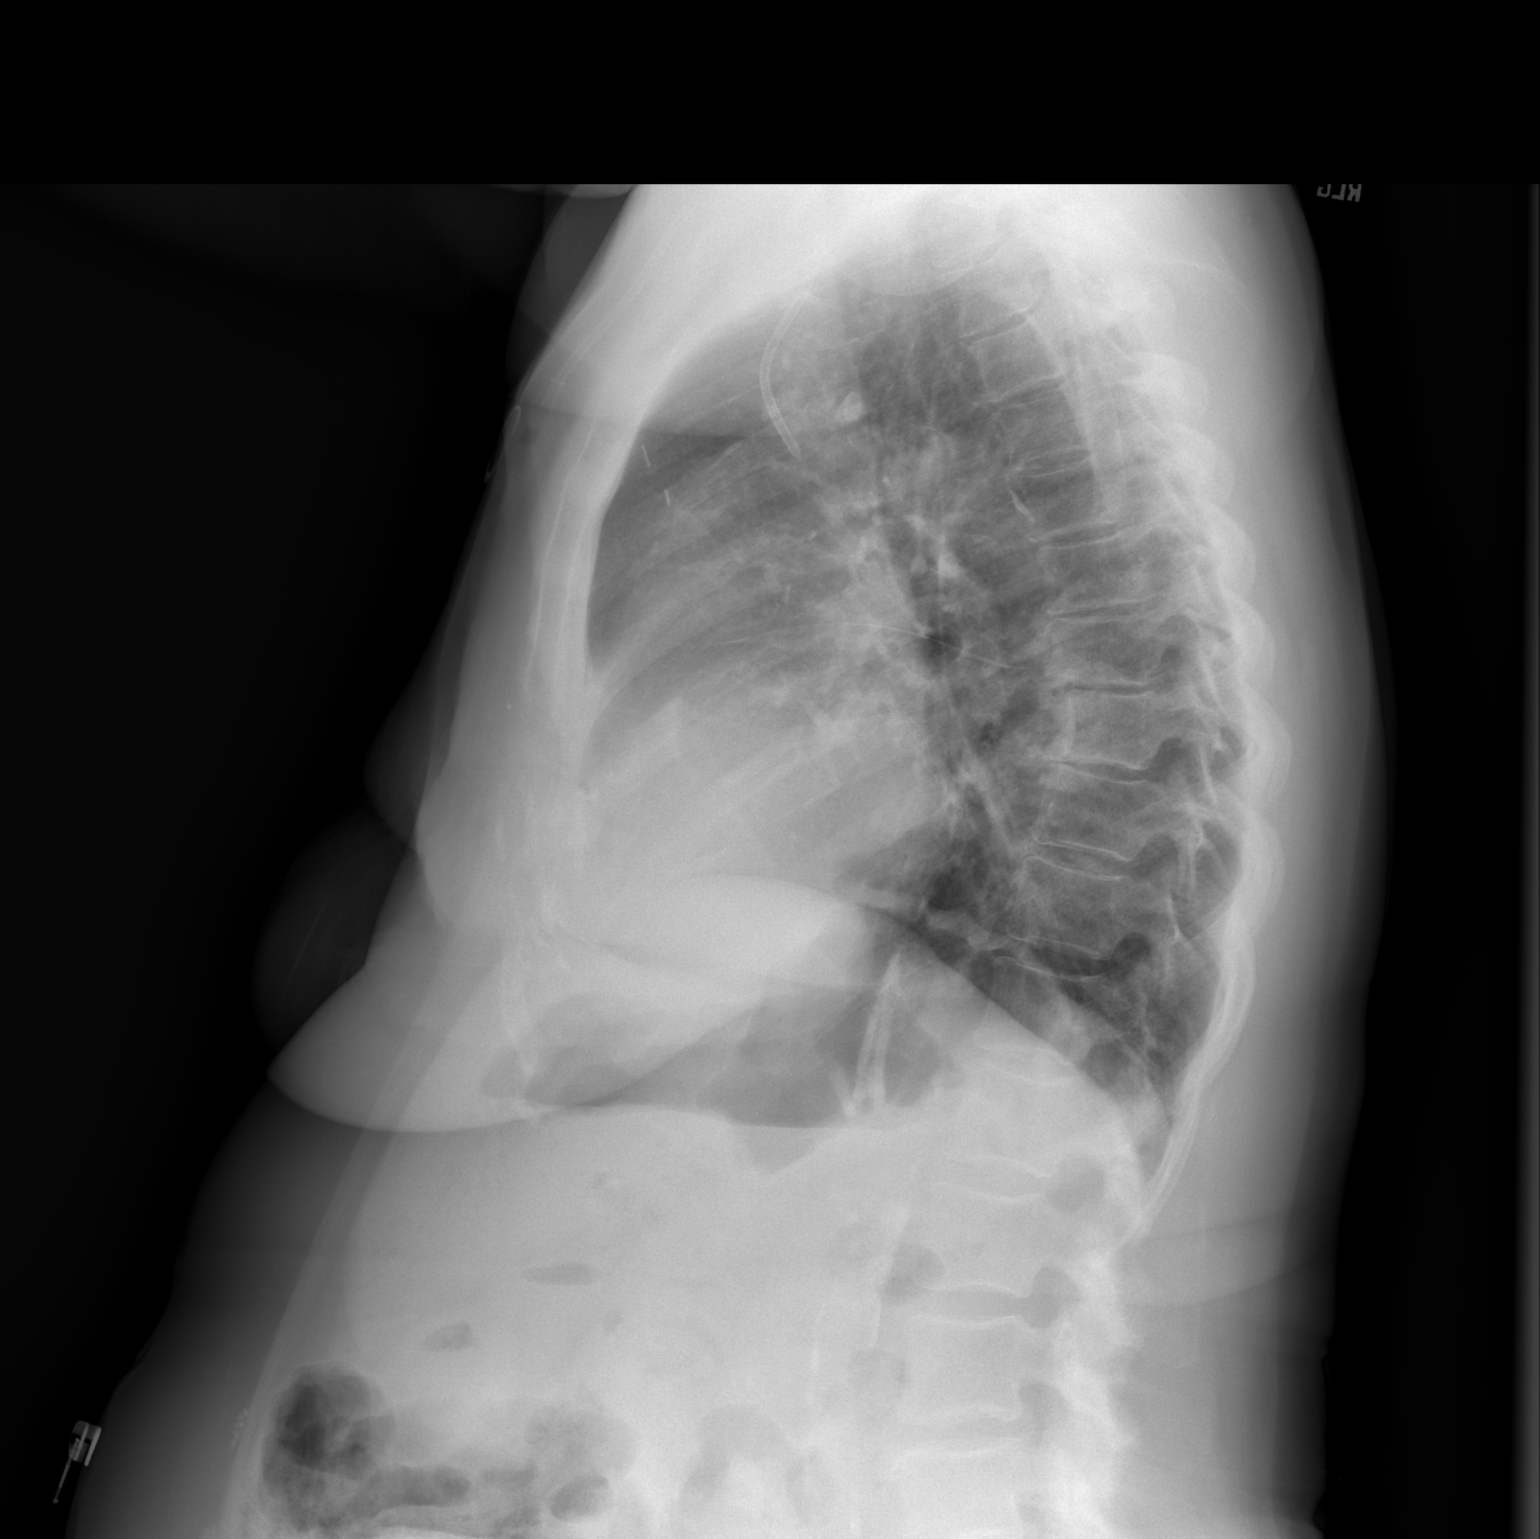

[2 of 2 positions shown; findings below may reference images not displayed]

FINDINGS: Port-A-Cath noted with tip over the upper superior vena cava.
Cardiomegaly with normal pulmonary vascularity. Lungs are clear. No
pleural effusion or pneumothorax. No acute bony abnormality.
IMPRESSION: 1. Port-A-Cath noted with tip over the upper superior vena cava. No
pneumothorax.

2.  No acute cardiopulmonary disease.

## 2020-11-08 ENCOUNTER — Other Ambulatory Visit: Payer: Self-pay

## 2020-11-08 ENCOUNTER — Ambulatory Visit: Payer: Medicare Other | Attending: Nurse Practitioner | Admitting: Nurse Practitioner

## 2020-11-08 ENCOUNTER — Telehealth: Payer: Self-pay | Admitting: Nurse Practitioner

## 2020-11-08 NOTE — Telephone Encounter (Signed)
Pt returned call and states she never received a call from office. Please advise

## 2020-11-08 NOTE — Telephone Encounter (Signed)
NO ANSWER. Left second voicemail to return call to office

## 2020-11-08 NOTE — Telephone Encounter (Signed)
No answer. LVM to return call for televisit today

## 2020-11-08 NOTE — Telephone Encounter (Signed)
Pt called saying she is waiting for a call.  She said no one has called her .  She verified her phone number to be [phone]

## 2020-11-08 NOTE — Telephone Encounter (Signed)
NO answer. LVM to return call to [phone]. Will try again to reach patient within the next hour.

## 2020-11-10 DIAGNOSIS — I10 Essential (primary) hypertension: Secondary | ICD-10-CM | POA: Diagnosis not present

## 2020-11-10 DIAGNOSIS — E785 Hyperlipidemia, unspecified: Secondary | ICD-10-CM | POA: Diagnosis not present

## 2020-11-10 DIAGNOSIS — I25118 Atherosclerotic heart disease of native coronary artery with other forms of angina pectoris: Secondary | ICD-10-CM | POA: Diagnosis not present

## 2020-11-11 IMAGING — DX DG CHEST 2V
2 series · 2 of 2 positions shown · non-contrast
Comparison: Radiographs 4 days prior 02/27/2018. Chest CT
12/18/2017

CLINICAL DATA: Chest pain.

EXAM:
CHEST - 2 VIEW

[chest lat]
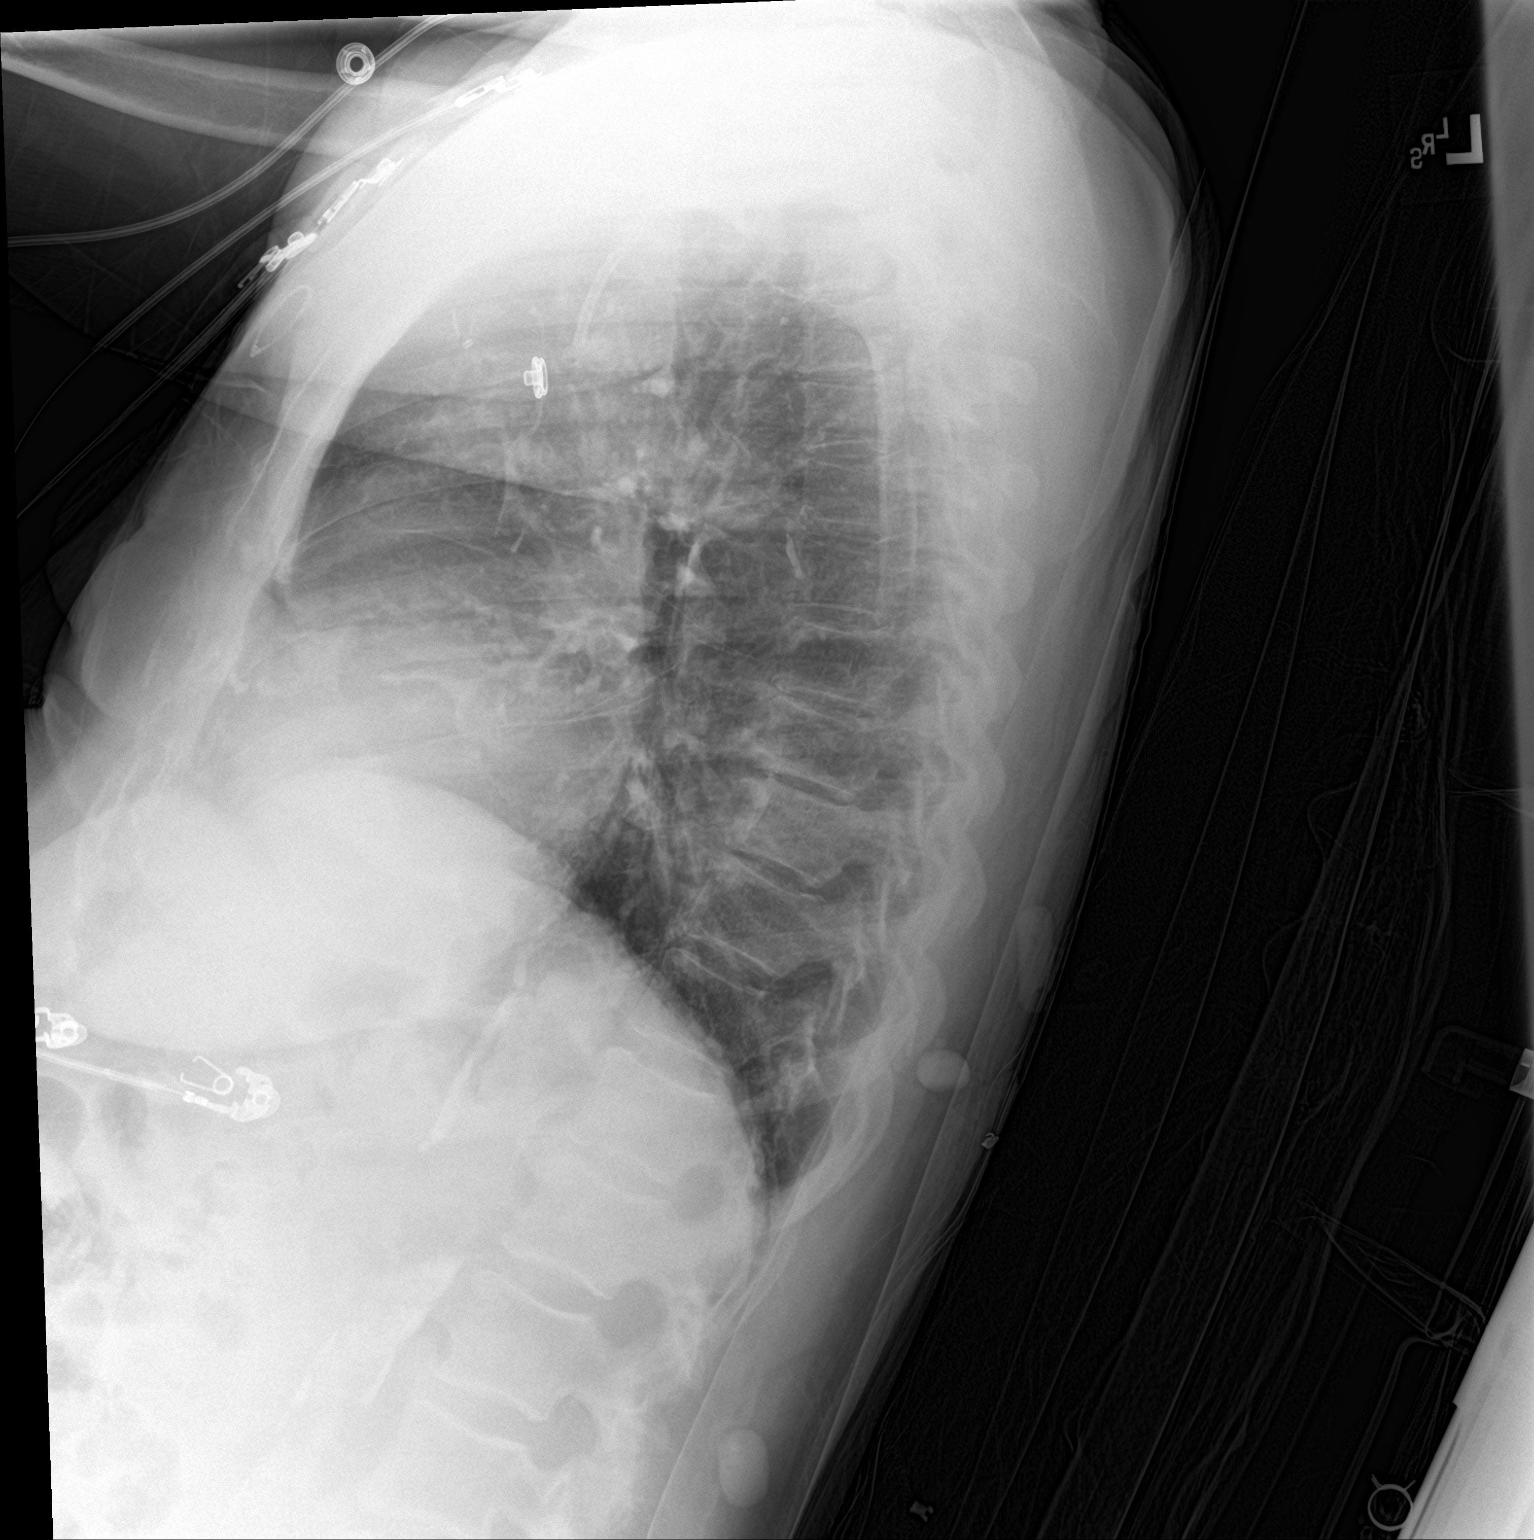

[chest ap]
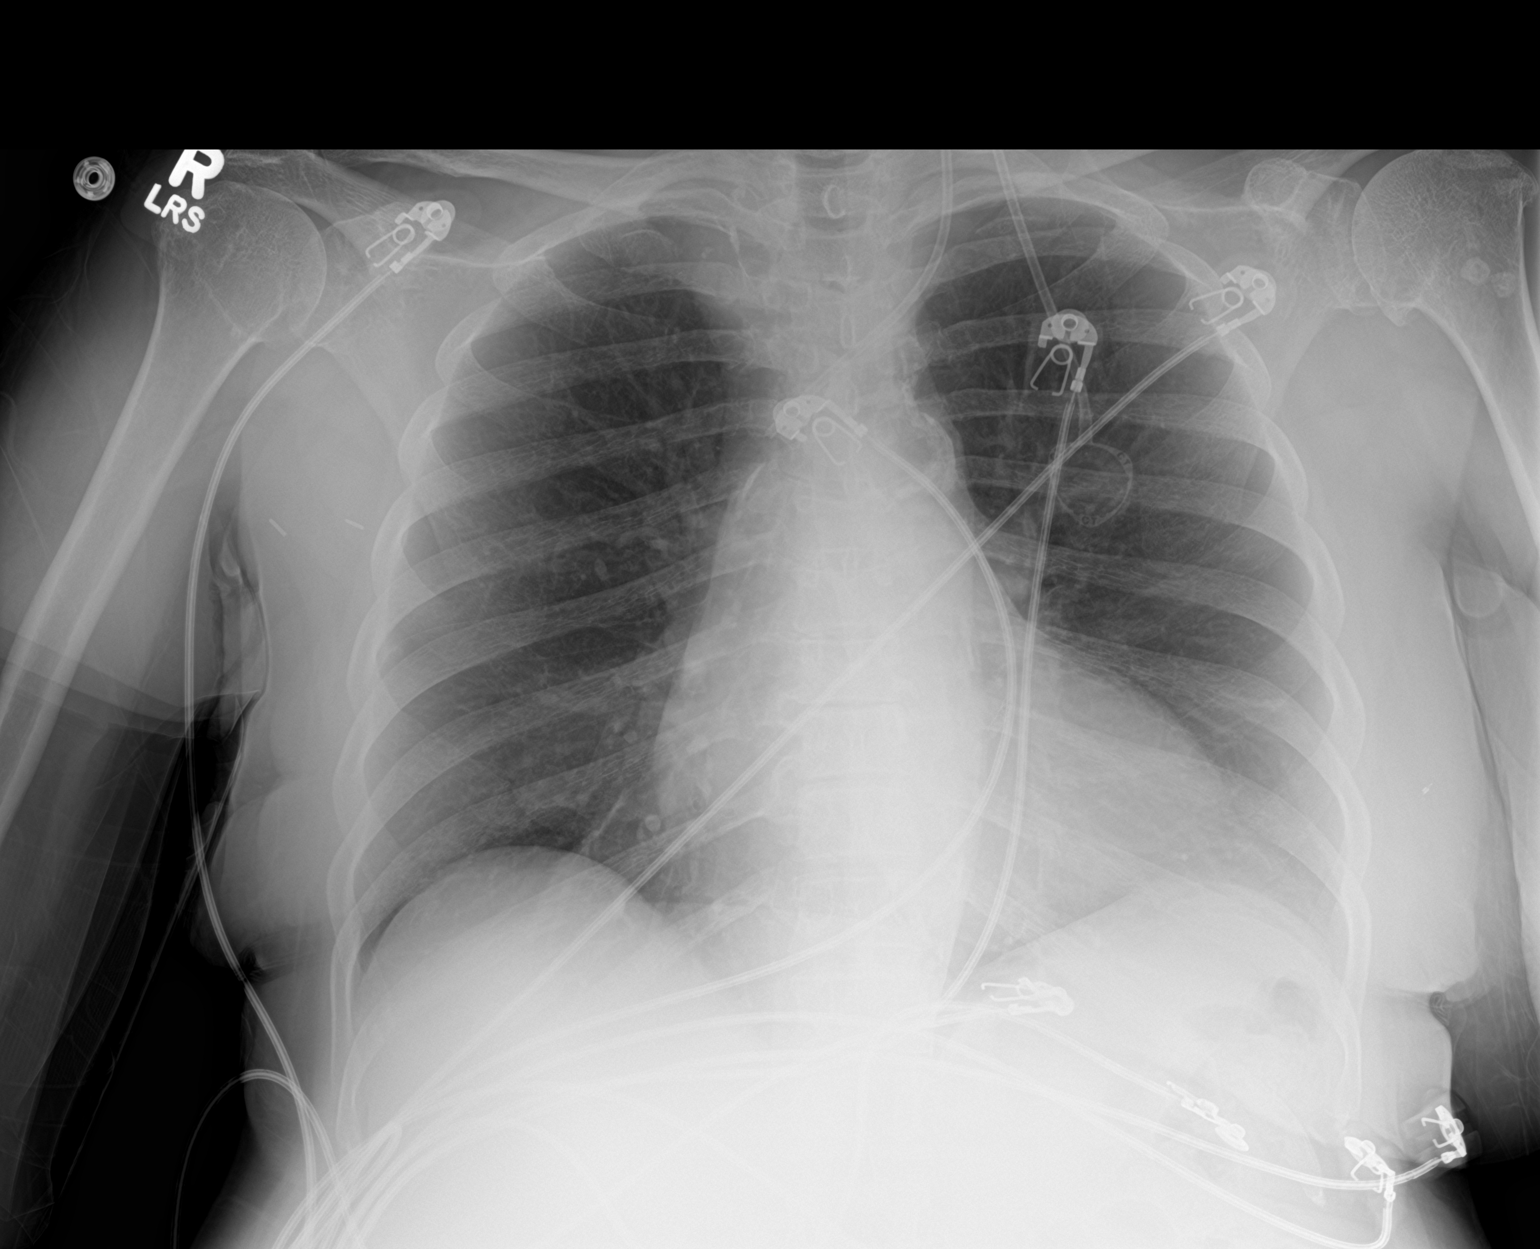

[2 of 2 positions shown; findings below may reference images not displayed]

FINDINGS: Tip of the left chest port is in the proximal, unchanged from prior
exams. Unchanged heart size and mediastinal contours with aortic
atherosclerosis. Borderline cardiomegaly. No confluent
consolidation, pleural effusion or pneumothorax. Surgical clips in
the right axilla. No acute osseous abnormalities.
IMPRESSION: 1. No acute findings or change from prior exam.
2. Borderline cardiomegaly with Aortic Atherosclerosis
([id number]).

## 2020-11-11 IMAGING — CT CT ANGIO CHEST
2 of 6 series · 18 of 36 positions shown · IV contrast (iopamidol)
Comparison: 12/18/2017

CLINICAL DATA: High pretest probability for pulmonary embolism.
Chest pain

EXAM:
CT ANGIOGRAPHY CHEST WITH CONTRAST
TECHNIQUE: Multidetector CT imaging of the chest was performed using the
standard protocol during bolus administration of intravenous
contrast. Multiplanar CT image reconstructions and MIPs were
obtained to evaluate the vascular anatomy.
CONTRAST:  60mL P5BX4B-6CV IOPAMIDOL (P5BX4B-6CV) INJECTION 76%

[Series 7: pe thins · axial · 0.78mm/px · z∈[+1246,+1430]mm · 17 of 293 slices shown]
[im 15/293  lung]
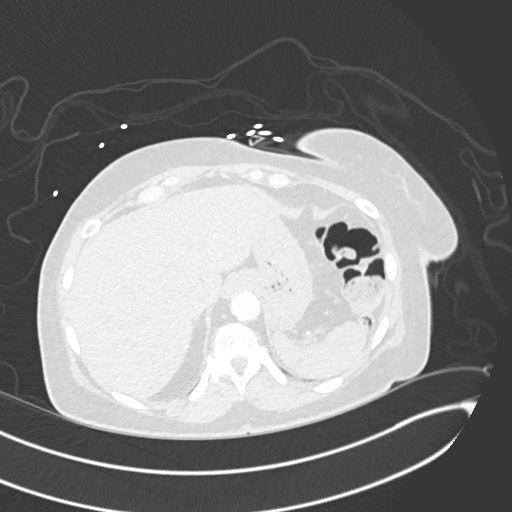
[im 30/293  mediastinal]
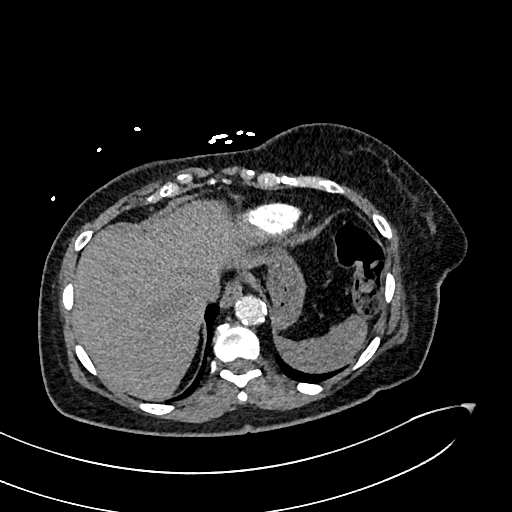
[im 44/293  lung]
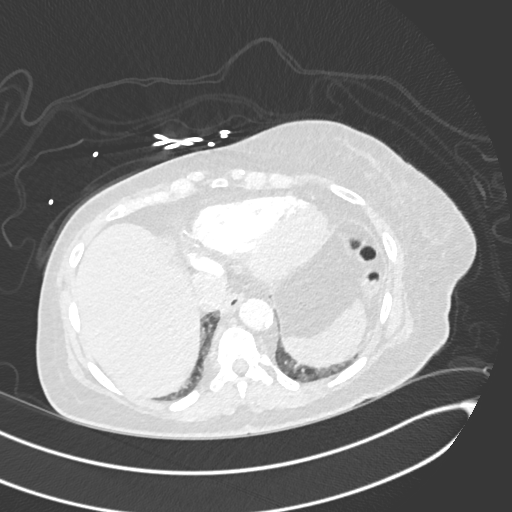
[im 59/293  mediastinal]
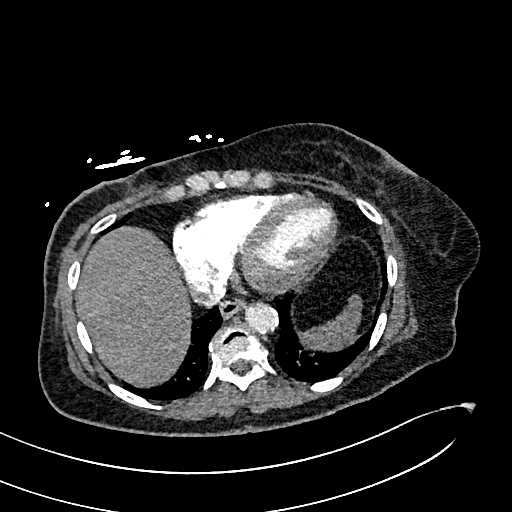
[im 88/293  lung]
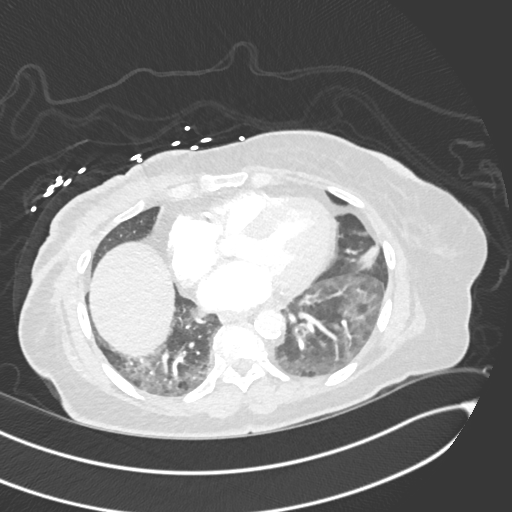
[im 103/293  mediastinal]
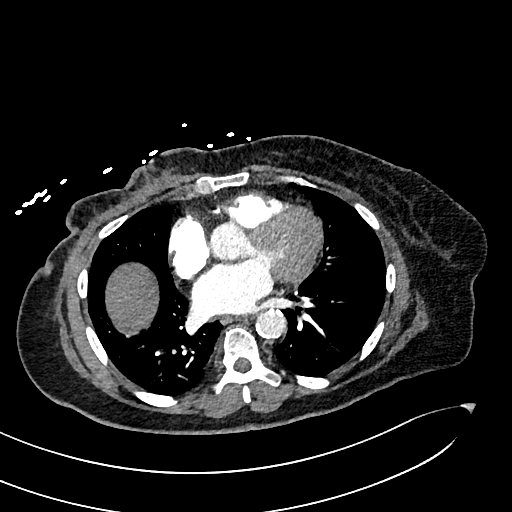
[im 117/293  lung]
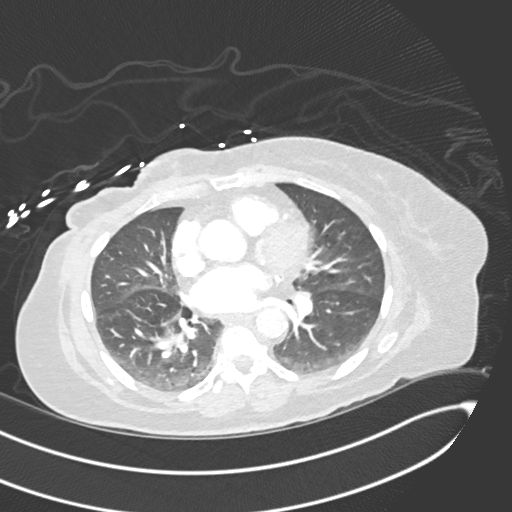
[im 132/293  mediastinal]
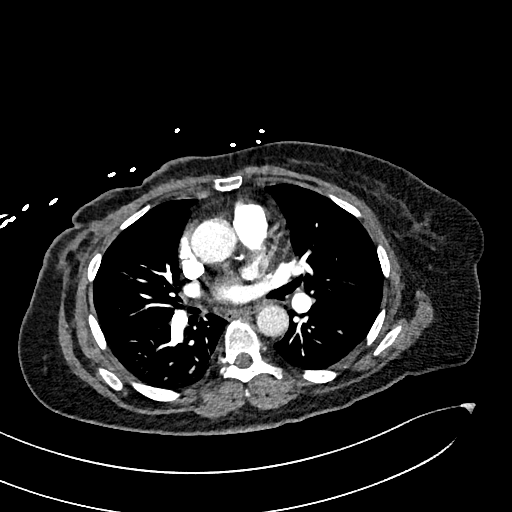
[im 147/293  lung]
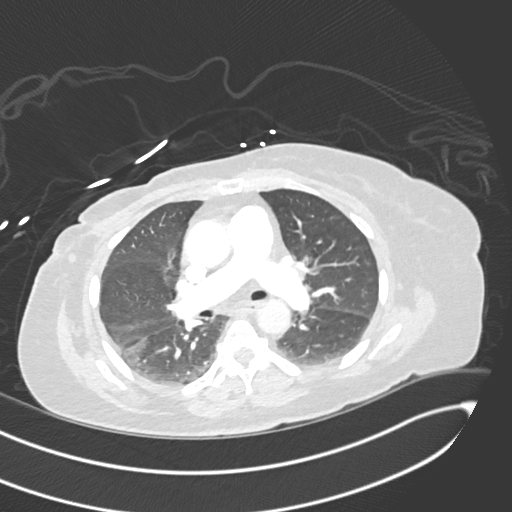
[im 161/293  mediastinal]
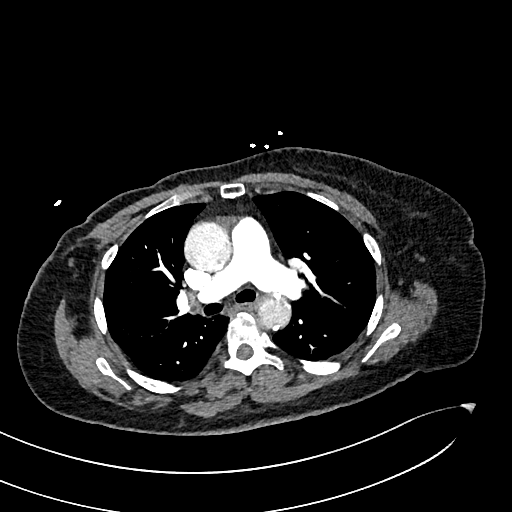
[im 176/293  lung]
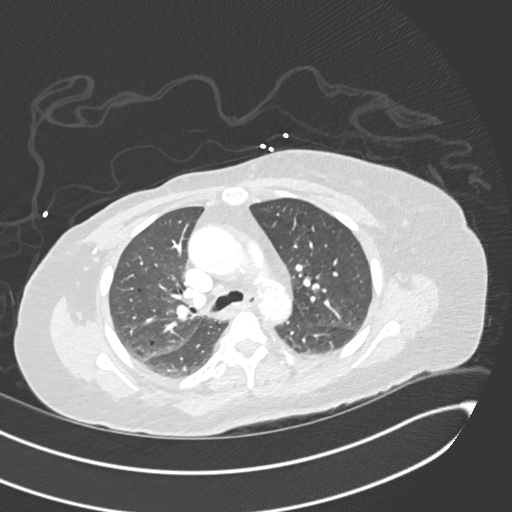
[im 190/293  mediastinal]
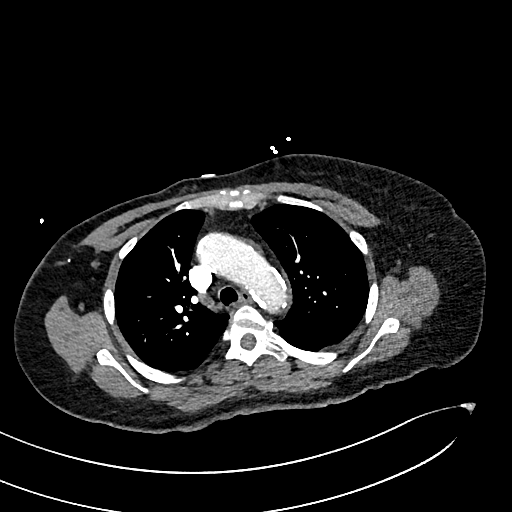
[im 205/293  lung]
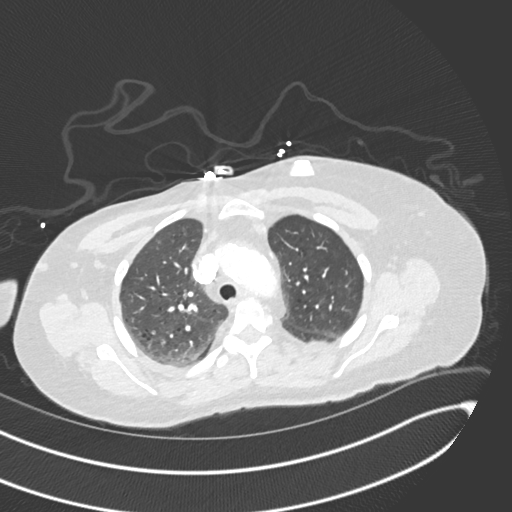
[im 234/293  mediastinal]
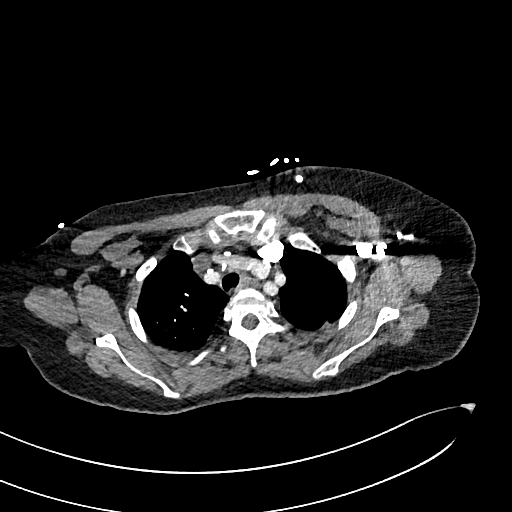
[im 249/293  lung]
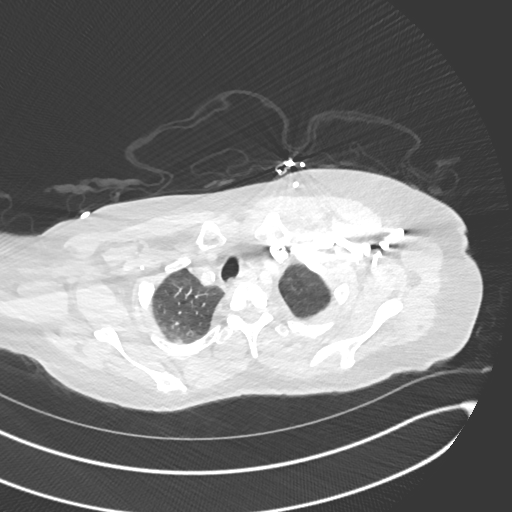
[im 263/293  mediastinal]
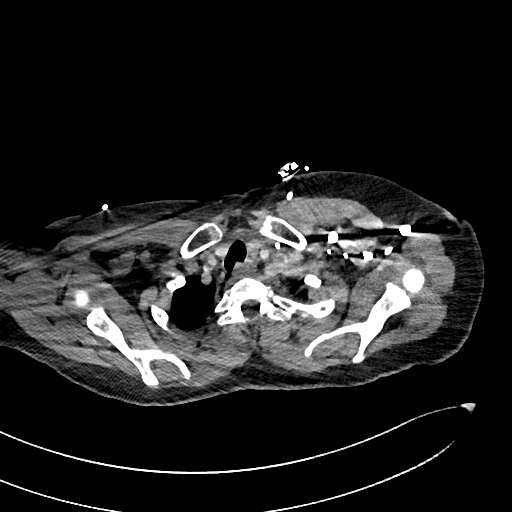
[im 278/293  lung]
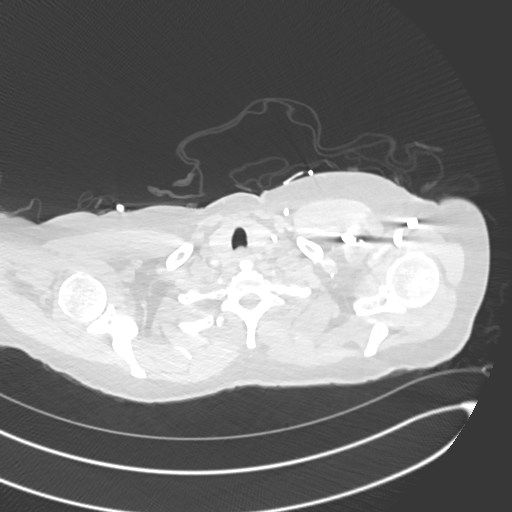

[Series 8: pe 2mm cor · coronal · 0.40mm/px · 1 of 104 slices shown]
[im 52/104  mediastinal]
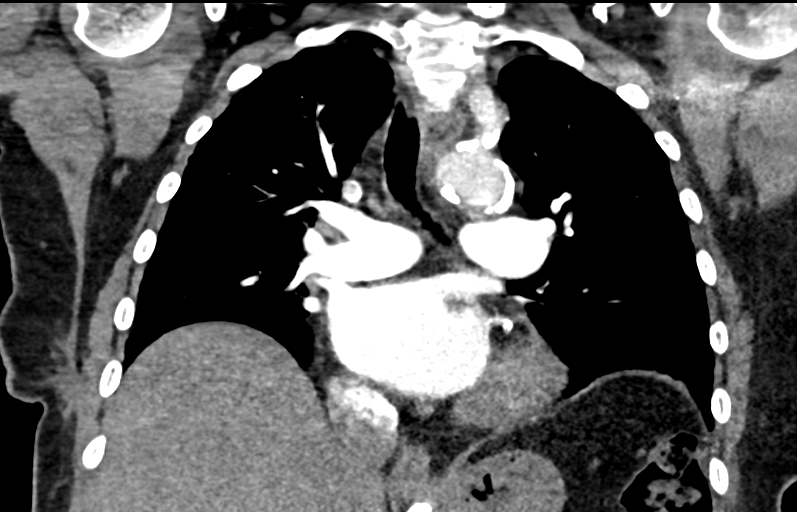

[18 of 36 positions shown; findings below may reference images not displayed]

FINDINGS: Cardiovascular: Satisfactory opacification of the pulmonary arteries
to the segmental level. No evidence of pulmonary embolism. Normal
heart size. No pericardial effusion. Diffuse atherosclerotic
calcification of the aorta. Coronary atherosclerotic calcification.
Left-sided porta catheter with tip at the left brachiocephalic SVC
junction.

Mediastinum/Nodes: Negative for adenopathy or mass.

Lungs/Pleura: Mild hazy density at the bases attributed atelectasis.
Minimal centrilobular emphysema. There is no edema, consolidation,
effusion, or pneumothorax.

Upper Abdomen: Negative

Musculoskeletal: No acute or aggressive finding. Midthoracic disc
degeneration.

Other: Right mastectomy and axillary dissection

Review of the MIP images confirms the above findings.
IMPRESSION: Negative for pulmonary embolism or other acute finding

## 2020-11-14 DIAGNOSIS — C50911 Malignant neoplasm of unspecified site of right female breast: Secondary | ICD-10-CM | POA: Diagnosis not present

## 2020-11-14 DIAGNOSIS — E1122 Type 2 diabetes mellitus with diabetic chronic kidney disease: Secondary | ICD-10-CM | POA: Diagnosis not present

## 2020-11-14 DIAGNOSIS — E785 Hyperlipidemia, unspecified: Secondary | ICD-10-CM | POA: Diagnosis not present

## 2020-11-14 DIAGNOSIS — N189 Chronic kidney disease, unspecified: Secondary | ICD-10-CM | POA: Diagnosis not present

## 2020-11-14 DIAGNOSIS — M1990 Unspecified osteoarthritis, unspecified site: Secondary | ICD-10-CM | POA: Diagnosis not present

## 2020-11-14 DIAGNOSIS — N1832 Chronic kidney disease, stage 3b: Secondary | ICD-10-CM | POA: Diagnosis not present

## 2020-11-14 DIAGNOSIS — N390 Urinary tract infection, site not specified: Secondary | ICD-10-CM | POA: Diagnosis not present

## 2020-11-14 DIAGNOSIS — N179 Acute kidney failure, unspecified: Secondary | ICD-10-CM | POA: Diagnosis not present

## 2020-11-14 DIAGNOSIS — I129 Hypertensive chronic kidney disease with stage 1 through stage 4 chronic kidney disease, or unspecified chronic kidney disease: Secondary | ICD-10-CM | POA: Diagnosis not present

## 2020-11-15 ENCOUNTER — Encounter: Payer: Self-pay | Admitting: Nurse Practitioner

## 2020-11-15 ENCOUNTER — Other Ambulatory Visit: Payer: Self-pay

## 2020-11-15 ENCOUNTER — Ambulatory Visit: Payer: Medicare Other | Attending: Nurse Practitioner | Admitting: Nurse Practitioner

## 2020-11-15 ENCOUNTER — Telehealth: Payer: Self-pay | Admitting: Nurse Practitioner

## 2020-11-15 DIAGNOSIS — J449 Chronic obstructive pulmonary disease, unspecified: Secondary | ICD-10-CM | POA: Diagnosis not present

## 2020-11-15 DIAGNOSIS — I10 Essential (primary) hypertension: Secondary | ICD-10-CM | POA: Diagnosis not present

## 2020-11-15 DIAGNOSIS — N1832 Chronic kidney disease, stage 3b: Secondary | ICD-10-CM

## 2020-11-15 MED ORDER — BLOOD PRESSURE MONITOR DEVI: 0 refills | Status: DC

## 2020-11-15 MED ORDER — ALBUTEROL SULFATE (2.5 MG/3ML) 0.083% IN NEBU: 2.5000 mg | INHALATION_SOLUTION | Freq: Four times a day (QID) | RESPIRATORY_TRACT | 3 refills | Status: DC | PRN

## 2020-11-15 NOTE — Telephone Encounter (Signed)
No answer. LVM ?

## 2020-11-15 NOTE — Progress Notes (Signed)
Virtual Visit via Telephone Note Due to national recommendations of social distancing due to [place] 19, telehealth visit is felt to be most appropriate for this patient at this time.  I discussed the limitations, risks, security and privacy concerns of performing an evaluation and management service by telephone and the availability of in person appointments. I also discussed with the patient that there may be a patient responsible charge related to this service. The patient expressed understanding and agreed to proceed.    I connected with [name] on 11/15/20  at   2:50 PM EDT  EDT by telephone and verified that I am speaking with the correct person using two identifiers.  Location of Patient: Private Residence   Location of Provider: [place] and [employer] Office    Persons participating in Telemedicine visit: Dominique Rankins FNP-BC [name]     History of Present Illness: Telemedicine visit for: F/U She has a past medical history of Angina, Arthritis, Asthma, Cancer ([place]) (09/04/2017), Carpal tunnel syndrome, bilateral (11/26/2016), Cholelithiasis, Cocaine abuse (07/2012), COPD, Coronary artery disease, DM2, Dysphagia, Dyspnea, Fatty liver disease, nonalcoholic (1914), GERD, Headache, Hearing loss, History of colonic polyps (2009), Hyperlipidemia, Hypertension, Iron deficiency anemia, Myocardial infarction, Ulnar neuropathy at elbow, 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      She has CKD. Had an appointment with nephrology yesterday. Stated they will need to make changes in her medications due to her kidney function. She can not recall which medications but states she was instructed that they would be getting in touch with her cardiologist regarding changes. Also needs a new BP device as current one is not correlating with office readings. Denies chest pain, shortness of breath, palpitations, lightheadedness, dizziness, headaches or BLE edema.   BP  Readings from Last 3 Encounters:  09/13/20 114/85  09/09/20 (!) 146/64  08/14/20 (!) 155/59       Past Medical History:  Diagnosis Date   Ambulates with cane    straight   Angina    no current problems per patient at PAT appt 11/05/18, nitroglycerin sl 06/30/20   Arthritis    HANDS"   Asthma    Cancer ([place]) 09/04/2017   Right Breast   Carpal tunnel syndrome, bilateral 11/26/2016   Cholelithiasis    Cocaine abuse ([place]) 07/2012   per E.R. drug screen   COPD (chronic obstructive pulmonary disease) ([place])    per patient   Coronary artery disease    Diabetes mellitus without mention of complication    type 2   Dysphagia    esophageal dysmotility on 03/2011 esophagram    Dyspnea    with exertion   Fatty liver disease, nonalcoholic 7829   on Imaging.    GERD (gastroesophageal reflux disease)    Headache    Hearing loss    bilateral - no hearing aids   History of colonic polyps 2009   adenomatous 2009, HP in 2009, 2011, 2013.    Hyperlipidemia    Hypertension    Iron deficiency anemia    Myocardial infarction [facility])    years ago, 6 stents placed   Ulnar neuropathy at elbow, 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   Wears dentures    full upper and partial lower   Wears glasses     Past Surgical History:  Procedure Laterality Date   ABDOMINAL ANGIOGRAM  02/20/2011   Procedure: ABDOMINAL ANGIOGRAM;  Surgeon: Clent Demark, MD;  Location: [facility] CATH LAB;  Service: Cardiovascular;;   BREAST BIOPSY Left  CARPAL TUNNEL RELEASE Right 07/04/2017   Procedure: RIGHT CARPAL TUNNEL RELEASE;  Surgeon: Leanora Cover, MD;  Location: [place];  Service: Orthopedics;  Laterality: Right;   CARPAL TUNNEL RELEASE Left 09/12/2017   Procedure: LEFT CARPAL TUNNEL RELEASE;  Surgeon: Leanora Cover, MD;  Location: [place];  Service: Orthopedics;  Laterality: Left;   CHOLECYSTECTOMY N/A 11/11/2018   Procedure: ATTEMPTED LAPAROSCOPIC CHOLECUSTECOMY,  OPEN CHOLECYSTECTOMY;  Surgeon: Coralie Keens, MD;  Location: [place];  Service: General;  Laterality: N/A;   COLONOSCOPY  11/30/2011   Procedure: COLONOSCOPY;  Surgeon: Lafayette Dragon, MD;  Location: [facility] ENDOSCOPY;  Service: Endoscopy;  Laterality: N/A;   CORONARY ANGIOPLASTY WITH STENT PLACEMENT  02/20/2011   ERCP N/A 10/03/2018   Procedure: ENDOSCOPIC RETROGRADE CHOLANGIOPANCREATOGRAPHY (ERCP);  Surgeon: Ladene Artist, MD;  Location[facility];  Service: Endoscopy;  Laterality: N/A;   ESOPHAGOGASTRODUODENOSCOPY  11/30/2011   Procedure: ESOPHAGOGASTRODUODENOSCOPY (EGD);  Surgeon: Lafayette Dragon, MD;  Location: [facility];  Service: Endoscopy;  Laterality: N/A;   ESOPHAGOGASTRODUODENOSCOPY N/A 02/23/2015   Procedure: ESOPHAGOGASTRODUODENOSCOPY (EGD);  Surgeon: Gatha Mayer, MD;  Location: [facility] ENDOSCOPY;  Service: Endoscopy;  Laterality: N/A;   EYE SURGERY Bilateral    cataracts removed   INTRAOPERATIVE CHOLANGIOGRAM N/A 11/11/2018   Procedure: Intraoperative Cholangiogram;  Surgeon: Coralie Keens, MD;  Location: [place];  Service: General;  Laterality: N/A;   IR REMOVAL TUN ACCESS W/ PORT W/O FL MOD SED  06/30/2020   LEFT HEART CATHETERIZATION WITH CORONARY ANGIOGRAM N/A 02/20/2011   Procedure: LEFT HEART CATHETERIZATION WITH CORONARY ANGIOGRAM;  Surgeon: Clent Demark, MD;  Location: [place] CATH LAB;  Service: Cardiovascular;  Laterality: N/A;   MASTECTOMY Right 11/05/2017   MASTECTOMY W/ SENTINEL NODE BIOPSY Right 11/27/2017   MASTECTOMY W/ SENTINEL NODE BIOPSY Right 11/27/2017   Procedure: RIGHT TOTAL MASTECTOMY WITH SENTINEL LYMPH NODE BIOPSY;  Surgeon: Excell Seltzer, MD;  Location: [place];  Service: General;  Laterality: Right;   PORT A CATH REVISION Left 05/07/2018   Procedure: PORT A CATH REVISION;  Surgeon: Excell Seltzer, MD;  [facility];  Service: General;  Laterality: Left;   PORTACATH PLACEMENT Left 11/27/2017   Procedure: INSERTION PORT-A-CATH;  Surgeon: Excell Seltzer, MD;  Location: [place];  Service:  General;  Laterality: Left;   RIGHT COLECTOMY  02/2007   for post polypectomy colonic perforation   SPHINCTEROTOMY  10/03/2018   Procedure: SPHINCTEROTOMY;  Surgeon: Ladene Artist, MD;  [facility];  Service: Endoscopy;;   TUBAL LIGATION      Family History  Problem Relation Age of Onset   Heart disease Mother    Diabetes Mother    Cancer Father        unsure what kind   Diabetes Sister    Colon cancer Neg Hx    Breast cancer Neg Hx     Social History   Socioeconomic History   Marital status: Divorced    Spouse name: Not on file   Number of children: 2   Years of education: Not on file   Highest education level: Not on file  Occupational History   Occupation: retired. prev worked in housekeeping w/ [employer]  Tobacco Use   Smoking status: Every Day    Packs/day: 0.50    Years: 47.00    Pack years: 23.50    Types: Cigarettes    Last attempt to quit: 05/09/2010    Years since quitting: 10.5   Smokeless tobacco: Never  Vaping Use   Vaping Use: Never used  Substance and Sexual Activity   Alcohol use: Yes   Drug use: Not Currently    Types: Cocaine    Comment: positive drug screen (cocaine, benzo's)07/2012 in emergency room   Sexual activity: Not Currently    Birth control/protection: Post-menopausal  Other Topics Concern   Not on file  Social History Narrative   Caffeine use daily.   Social Determinants of Health   Financial Resource Strain: Not on file  Food Insecurity: Not on file  Transportation Needs: Not on file  Physical Activity: Not on file  Stress: Not on file  Social Connections: Not on file     Observations/Objective: Awake, alert and oriented x 3   Review of Systems  Constitutional:  Negative for fever, malaise/fatigue and weight loss.  HENT:  Positive for hearing loss (VERY HOH). Negative for nosebleeds.   Eyes: Negative.  Negative for blurred vision, double vision and photophobia.  Respiratory: Negative.  Negative for cough and  shortness of breath.   Cardiovascular: Negative.  Negative for chest pain, palpitations and leg swelling.  Gastrointestinal: Negative.  Negative for heartburn, nausea and vomiting.  Musculoskeletal: Negative.  Negative for myalgias.  Neurological: Negative.  Negative for dizziness, focal weakness, seizures and headaches.  Psychiatric/Behavioral: Negative.  Negative for suicidal ideas.    Assessment and Plan: Diagnoses and all orders for this visit:  Stage 3b chronic kidney disease ([place]) Follow up with nephrology as instructed  Chronic obstructive pulmonary disease, unspecified COPD type ([place]) -     albuterol (PROVENTIL) (2.5 MG/3ML) 0.083% nebulizer solution; Take 3 mLs (2.5 mg total) by nebulization every 6 (six) hours as needed for wheezing or shortness of breath.  Essential hypertension -     Blood Pressure Monitor DEVI; Please provide patient with insurance approved blood pressure monitor. I10.0 Continue all antihypertensives as prescribed.  Remember to bring in your blood pressure log with you for your follow up appointment.  DASH/Mediterranean Diets are healthier choices for HTN.      Follow Up Instructions Return in about 2 months (around 01/15/2021).     I discussed the assessment and treatment plan with the patient. The patient was provided an opportunity to ask questions and all were answered. The patient agreed with the plan and demonstrated an understanding of the instructions.   The patient was advised to call back or seek an in-person evaluation if the symptoms worsen or if the condition fails to improve as anticipated.  I provided 10 minutes of non-face-to-face time during this encounter including median intraservice time, reviewing previous notes, labs, imaging, medications and explaining diagnosis and management.  Gildardo Pounds, FNP-BC

## 2020-11-15 NOTE — Telephone Encounter (Signed)
Patient called back about where the blood pressure cup will be sent that she requested

## 2020-11-16 DIAGNOSIS — H905 Unspecified sensorineural hearing loss: Secondary | ICD-10-CM | POA: Diagnosis not present

## 2020-11-16 NOTE — Telephone Encounter (Signed)
Pt was  instructed that monitor was sent to [place]

## 2020-11-18 DIAGNOSIS — M25512 Pain in left shoulder: Secondary | ICD-10-CM | POA: Diagnosis not present

## 2020-11-24 ENCOUNTER — Telehealth: Payer: Self-pay | Admitting: Hematology and Oncology

## 2020-11-24 NOTE — Telephone Encounter (Signed)
Scheduled appt per 10/19 referral. Called pt, no answer. Left msg with appt date and time.

## 2020-11-25 ENCOUNTER — Other Ambulatory Visit: Payer: Self-pay | Admitting: Nurse Practitioner

## 2020-11-25 DIAGNOSIS — Z794 Long term (current) use of insulin: Secondary | ICD-10-CM

## 2020-11-25 NOTE — Telephone Encounter (Signed)
  Notes to clinic just filled on 08/31/20  Requested Prescriptions  Pending Prescriptions Disp Refills   [place] 18 MG/3ML SOPN [Pharmacy Med Name: Victoza 3-Pak 0.6 mg/0.1 mL (18 mg/3 mL) subcutaneous pen injector] 9 mL 2    Sig: INJECT 1.8 MG INTO THE SKIN DAILY     Endocrinology:  Diabetes - GLP-1 Receptor Agonists Passed - 11/25/2020  8:04 AM      Passed - HBA1C is between 0 and 7.9 and within 180 days    Hgb A1c MFr Bld  Date Value Ref Range Status  10/13/2020 5.9 (H) 4.8 - 5.6 % Final    Comment:             Prediabetes: 5.7 - 6.4          Diabetes: >6.4          Glycemic control for adults with diabetes: <7.0           Passed - Valid encounter within last 6 months    Recent Outpatient Visits           1 week ago Stage 3b chronic kidney disease ([place])   [place], [place] W, NP   3 months ago Stage 3b chronic kidney disease [facility])   [place], [place] W, NP   8 months ago Type 2 diabetes mellitus with stage 3b chronic kidney disease, with long-term current use of insulin [facility])   [place], [place] W, NP   10 months ago Insulin dependent type 2 diabetes mellitus [facility])   [place], Annie Main L, RPH-CPP   10 months ago Insulin dependent type 2 diabetes mellitus [facility])   [place], RPH-CPP       Future Appointments             In 2 weeks Ladell Pier, MD [place]

## 2020-11-28 NOTE — Progress Notes (Signed)
Patient Care Team: Gildardo Pounds, NP as PCP - General (Nurse Practitioner) Nicholas Lose, MD as Consulting Physician (Hematology and Oncology)  DIAGNOSIS:    ICD-10-CM   1. MGUS (monoclonal gammopathy of unknown significance)  D47.2 CBC with Differential ([place])    CMP ([place] only)    Beta 2 microglobulin, serum    Kappa/lambda light chains    Multiple Myeloma Panel (SPEP&IFE w/QIG)    DG Bone Survey Met    2. Essential hypertension  I10 amLODipine (NORVASC) 10 MG tablet      SUMMARY OF ONCOLOGIC HISTORY: Oncology History  Malignant neoplasm of upper-outer quadrant of right breast in female, estrogen receptor positive ([place])  09/03/2017 Initial Diagnosis   Screening mammogram detected 1.4 cm right breast mass at 10 o'clock position, additional pleomorphic calcifications 2 cm posteriorly, additional loosely grouped calcifications 10.1 x 7.1 x 5.3 cm, single right axillary lymph node: Biopsy revealed DCIS ER 100%, PR 50%; IDC grade 2-3 ER 100%, PR 50%, Ki-67 15%, HER-2 positive ratio 2, copy #5, lymph node negative T2 N0 stage Ia clinical stage AJCC 8   09/03/2017 Cancer Staging   Staging form: Breast, AJCC 8th Edition - Clinical stage from 09/03/2017: Stage IB (cT2, cN0, cM0, G3, ER+, PR+, HER2+)   11/27/2017 Surgery   Right mastectomy ([place]) ([id number]): IDC grade 2, 1.6 cm, separate foci of DCIS intermediate grade, margins negative, 1/5 LN positive for micrometasatic carcinoma. ER 100%, PR 50%, Ki-67 15%, HER-2 positive ratio 2, copy #5 T1c N0 stage Ia   11/27/2017 Cancer Staging   Staging form: Breast, AJCC 8th Edition - Pathologic stage from 11/27/2017: Stage IA (pT1c, pN0, cM0, G2, ER+, PR+, HER2+)     02/07/2018 - 03/12/2019 Chemotherapy   trastuzumab (HERCEPTIN) 483 mg in sodium chloride 0.9 % 250 mL chemo infusion, 8 mg/kg = 483 mg, Intravenous,  Once, 11 of 11 cycles. Administration: 483 mg (02/07/2018), 357 mg (02/27/2018), 357 mg (05/01/2018), 357 mg  (05/23/2018), 357 mg (06/12/2018), 357 mg (07/03/2018), 357 mg (08/14/2018), 357 mg (09/04/2018), 357 mg (10/17/2018), 357 mg (03/20/2018), 357 mg (04/10/2018)  trastuzumab-anns (KANJINTI) 357 mg in sodium chloride 0.9 % 250 mL chemo infusion, 6 mg/kg = 357 mg (100 % of original dose 6 mg/kg), Intravenous,  Once, 7 of 7 cycles. Dose modification: 6 mg/kg (original dose 6 mg/kg, Cycle 12, Reason: Other (see comments)). Administration: 357 mg (11/06/2018), 357 mg (11/27/2018), 357 mg (12/18/2018), 357 mg (01/08/2019), 357 mg (01/29/2019), 357 mg (02/19/2019), 357 mg (03/12/2019).   12/2018 - 12/2023 Anti-estrogen oral therapy   Anastrozole     CHIEF COMPLIANT: New diagnosis of MGUS  INTERVAL HISTORY: [name] is a 70 y.o. with above-mentioned history of right breast cancer who underwent a right mastectomy, Herceptin maintenance, and anti-estrogen therapy with anastrozole. She presents to the clinic today for follow-up  regarding a new diagnosis of MGUS.  She had worsening of renal function and therefore extensive work-up was performed which included serum protein electrophoresis.  This revealed 1.9 g of IgG kappa monoclonal protein.  She was referred to [place] for evaluation regarding myeloma versus MGUS.  ALLERGIES:  has No Known Allergies.  MEDICATIONS:  Current Outpatient Medications  Medication Sig Dispense Refill   acetaminophen (TYLENOL) 500 MG tablet Take 1 tablet (500 mg total) by mouth every 6 (six) hours as needed. 30 tablet 0   albuterol (PROVENTIL) (2.5 MG/3ML) 0.083% nebulizer solution Take 3 mLs (2.5 mg total) by nebulization every 6 (six) hours as needed  for wheezing or shortness of breath. 75 mL 3   ALPRAZolam (XANAX) 0.25 MG tablet Take 0.25 mg by mouth at bedtime.     amLODipine (NORVASC) 10 MG tablet Take 1 tablet (10 mg total) by mouth daily.     anastrozole (ARIMIDEX) 1 MG tablet Take 1 tablet (1 mg total) by mouth daily. 90 tablet 3   aspirin 81 MG EC tablet Take 1 tablet (81 mg total)  by mouth daily. 30 tablet 0   atorvastatin (LIPITOR) 20 MG tablet Take 1 tablet (20 mg total) by mouth daily at 6 PM. 90 tablet 1   Blood Pressure Monitor DEVI Please provide patient with insurance approved blood pressure monitor. I10.0 1 each 0   budesonide-formoterol (SYMBICORT) 160-4.5 MCG/ACT inhaler Inhale 1 puff into the lungs 2 (two) times daily as needed (respiratory issues.). 1 each 6   diclofenac Sodium (VOLTAREN) 1 % GEL Apply 4 g topically 4 (four) times daily. 100 g 1   Insulin Pen Needle (TRUEPLUS PEN NEEDLES) 31G X 8 MM MISC Inject into the skin once nightly.  E11.65 100 each 2   LANTUS SOLOSTAR 100 UNIT/ML Solostar Pen Inject 5 Units into the skin at bedtime. E11.65 6 mL 1   losartan (COZAAR) 25 MG tablet TAKE ONE TABLET BY MOUTH DAILY 90 tablet 0   metoprolol succinate (TOPROL-XL) 50 MG 24 hr tablet TAKE ONE TABLET BY MOUTH EVERY DAY *TAKE WITH OR IMMEDIATELY FOLLOWING A MEAL* 90 tablet 0   Misc. Devices MISC Please provide patient with nebulizer mask and tubing. ICD-10J44.9 1 each 0   Multiple Vitamin (MULTIVITAMIN WITH MINERALS) TABS tablet Take 1 tablet by mouth daily. [place]     nitroGLYCERIN (NITROSTAT) 0.4 MG SL tablet Place 1 tablet (0.4 mg total) under the tongue every 5 (five) minutes as needed for chest pain. 10 tablet 0   Omega-3 Fatty Acids (FISH OIL) 1000 MG CAPS Take 1,000 mg by mouth daily.     pantoprazole (PROTONIX) 40 MG tablet Take 1 tablet (40 mg total) by mouth daily. 90 tablet 0   traMADol (ULTRAM) 50 MG tablet Take 1 tablet (50 mg total) by mouth every 6 (six) hours as needed for moderate pain. 30 tablet 0   VICTOZA 18 MG/3ML SOPN INJECT 1.8 MG INTO TO SKIN DAILY 9 mL 2   No current facility-administered medications for this visit.    PHYSICAL EXAMINATION: ECOG PERFORMANCE STATUS: 1 - Symptomatic but completely ambulatory  Vitals:   11/29/20 1339  BP: 101/62  Pulse: 77  Resp: 18  Temp: 97.6 F (36.4 C)  SpO2: 100%   Filed Weights   11/29/20  1339  Weight: 129 lb 1.6 oz (58.6 kg)     LABORATORY DATA:  I have reviewed the data as listed CMP Latest Ref Rng & Units 10/13/2020 03/15/2020 09/11/2019  Glucose 65 - 99 mg/dL 93 92 171(H)  BUN 8 - 27 mg/dL _0 Creatinine 0.57 - 1.00 mg/dL 2.36(H) 1.65(H) 1.62(H)  Sodium 134 - 144 mmol/L 140 142 138  Potassium 3.5 - 5.2 mmol/L 4.8 4.6 5.1  Chloride 96 - 106 mmol/L 101 103 99  CO2 20 - 29 mmol/L _1 Calcium 8.7 - 10.3 mg/dL 10.7(H) 10.7(H) 10.4(H)  Total Protein 6.0 - 8.5 g/dL 8.6(H) 8.3 8.4  Total Bilirubin 0.0 - 1.2 mg/dL 0.8 0.5 0.6  Alkaline Phos 44 - 121 IU/L 118 111 140(H)  AST 0 - 40 IU/L _2 ALT 0 - 32  IU/L 11 13 18    Lab Results  Component Value Date   WBC 5.4 03/15/2020   HGB 14.0 03/15/2020   HCT 41.9 03/15/2020   MCV 92 03/15/2020   PLT 283 03/15/2020   NEUTROABS 4.0 03/12/2019    ASSESSMENT & PLAN:  MGUS (monoclonal gammopathy of unknown significance) Lab review: SPEP 11/14/2020: M spike: 1.9 g, IgG 2884, immunofixation shows IgG kappa Creatinine: 2.38 (baseline creatinine 1.6), calcium 9.7, albumin 4.5, hemoglobin 13.1  Differential diagnosis: MGUS versus myeloma There is no evidence of severe anemia or hypercalcemia. Acute renal failure could be related to underlying cardiac issues.  Plan: 1.  Kappa lambda ratio 2. beta-2 microglobulin 3.  Bone survey Consider bone marrow biopsy if there is anything else suspicious for myeloma.  History of breast cancer: Currently on anastrozole therapy and doing well.  Return to clinic in 2 weeks after these test to discuss results.    Orders Placed This Encounter  Procedures   DG Bone Survey Met    Standing Status:   Future    Standing Expiration Date:   11/29/2021    Order Specific Question:   Reason for Exam (SYMPTOM  OR DIAGNOSIS REQUIRED)    Answer:   staging myeloma    Order Specific Question:   Preferred imaging location?    Answer:   [facility]    Order Specific Question:    Release to patient    Answer:   Immediate   CBC with Differential (Cancer Center Only)    Standing Status:   Future    Standing Expiration Date:   11/29/2021   CMP (Cancer Center only)    Standing Status:   Future    Standing Expiration Date:   11/29/2021   Beta 2 microglobulin, serum    Standing Status:   Future    Standing Expiration Date:   11/29/2021   Kappa/lambda light chains    Standing Status:   Future    Standing Expiration Date:   11/29/2021   Multiple Myeloma Panel (SPEP&IFE w/QIG)    Standing Status:   Future    Standing Expiration Date:   11/29/2021   The patient has a good understanding of the overall plan. she agrees with it. she will call with any problems that may develop before the next visit here.  Total time spent: 45 mins including face to face time and time spent for planning, charting and coordination of care  Vinay K Gudena, MD, MPH 11/29/2020  I, Kirstyn Evans, am acting as scribe for Dr. Vinay Gudena.  I have reviewed the above documentation for accuracy and completeness, and I agree with the above.       

## 2020-11-29 ENCOUNTER — Inpatient Hospital Stay: Payer: Medicare Other | Attending: Hematology and Oncology | Admitting: Hematology and Oncology

## 2020-11-29 ENCOUNTER — Other Ambulatory Visit: Payer: Self-pay

## 2020-11-29 ENCOUNTER — Other Ambulatory Visit: Payer: Self-pay | Admitting: Family Medicine

## 2020-11-29 ENCOUNTER — Inpatient Hospital Stay: Payer: Medicare Other

## 2020-11-29 DIAGNOSIS — D472 Monoclonal gammopathy: Secondary | ICD-10-CM

## 2020-11-29 DIAGNOSIS — Z79811 Long term (current) use of aromatase inhibitors: Secondary | ICD-10-CM | POA: Insufficient documentation

## 2020-11-29 DIAGNOSIS — I10 Essential (primary) hypertension: Secondary | ICD-10-CM

## 2020-11-29 DIAGNOSIS — C50411 Malignant neoplasm of upper-outer quadrant of right female breast: Secondary | ICD-10-CM | POA: Diagnosis not present

## 2020-11-29 DIAGNOSIS — Z170 Estrogen receptor positive status [ER+]: Secondary | ICD-10-CM | POA: Insufficient documentation

## 2020-11-29 LAB — CBC WITH DIFFERENTIAL (CANCER CENTER ONLY)
Abs Immature Granulocytes: 0.06 10*3/uL (ref 0.00–0.07)
Basophils Absolute: 0.1 10*3/uL (ref 0.0–0.1)
Basophils Relative: 1 %
Eosinophils Absolute: 0.2 10*3/uL (ref 0.0–0.5)
Eosinophils Relative: 3 %
HCT: 37 % (ref 36.0–46.0)
Hemoglobin: 12 g/dL (ref 12.0–15.0)
Immature Granulocytes: 1 %
Lymphocytes Relative: 36 %
Lymphs Abs: 2.4 10*3/uL (ref 0.7–4.0)
MCH: 30.8 pg (ref 26.0–34.0)
MCHC: 32.4 g/dL (ref 30.0–36.0)
MCV: 94.9 fL (ref 80.0–100.0)
Monocytes Absolute: 0.7 10*3/uL (ref 0.1–1.0)
Monocytes Relative: 10 %
Neutro Abs: 3.3 10*3/uL (ref 1.7–7.7)
Neutrophils Relative %: 49 %
Platelet Count: 263 10*3/uL (ref 150–400)
RBC: 3.9 MIL/uL (ref 3.87–5.11)
RDW: 13.7 % (ref 11.5–15.5)
WBC Count: 6.6 10*3/uL (ref 4.0–10.5)
nRBC: 0 % (ref 0.0–0.2)

## 2020-11-29 LAB — CMP (CANCER CENTER ONLY)
ALT: 27 U/L (ref 0–44)
AST: 24 U/L (ref 15–41)
Albumin: 3.8 g/dL (ref 3.5–5.0)
Alkaline Phosphatase: 113 U/L (ref 38–126)
Anion gap: 8 (ref 5–15)
BUN: 26 mg/dL — ABNORMAL HIGH (ref 8–23)
CO2: 24 mmol/L (ref 22–32)
Calcium: 9.7 mg/dL (ref 8.9–10.3)
Chloride: 109 mmol/L (ref 98–111)
Creatinine: 2.3 mg/dL — ABNORMAL HIGH (ref 0.44–1.00)
GFR, Estimated: 22 mL/min — ABNORMAL LOW (ref >60)
Glucose, Bld: 78 mg/dL (ref 70–99)
Potassium: 4.7 mmol/L (ref 3.5–5.1)
Sodium: 141 mmol/L (ref 135–145)
Total Bilirubin: 0.6 mg/dL (ref 0.3–1.2)
Total Protein: 8.2 g/dL — ABNORMAL HIGH (ref 6.5–8.1)

## 2020-11-29 MED ORDER — AMLODIPINE BESYLATE 10 MG PO TABS: 10.0000 mg | ORAL_TABLET | Freq: Every day | ORAL | Status: DC

## 2020-11-29 NOTE — Assessment & Plan Note (Signed)
Lab review: SPEP 11/14/2020: M spike: 1.9 g, IgG 2884, immunofixation shows IgG kappa Creatinine: 2.38 (baseline creatinine 1.6), calcium 9.7, albumin 4.5, hemoglobin 13.1  Differential diagnosis: MGUS versus myeloma There is no evidence of severe anemia or hypercalcemia. Acute renal failure could be related to underlying cardiac issues.  Plan: 1.  Kappa lambda ratio 2. beta-2 microglobulin 3.  Bone survey Consider bone marrow biopsy if there is anything else suspicious for myeloma.  Return to clinic after these test to discuss results.

## 2020-11-29 NOTE — Telephone Encounter (Signed)
Future RF request- too soon Requested Prescriptions  Pending Prescriptions Disp Refills  . Insulin Pen Needle (SURE COMFORT PEN NEEDLES) 31G X 8 MM MISC [Pharmacy Med Name: Sure Comfort Pen Needle 31 gauge x 5/16"] 100 each 2    Sig: USE TO INJECT INSULIN NIGHTLY     Endocrinology: Diabetes - Testing Supplies Passed - 11/29/2020  8:04 AM      Passed - Valid encounter within last 12 months    Recent Outpatient Visits          2 weeks ago Stage 3b chronic kidney disease [facility])   [place], [place] W, NP   3 months ago Stage 3b chronic kidney disease [facility])   [place], [place], NP   8 months ago Type 2 diabetes mellitus with stage 3b chronic kidney disease, with long-term current use of insulin [facility])   [place], [place] W, NP   10 months ago Insulin dependent type 2 diabetes mellitus [facility])   [place], Annie Main L, RPH-CPP   10 months ago Insulin dependent type 2 diabetes mellitus [facility])   [place], RPH-CPP      Future Appointments            In 1 week Ladell Pier, MD [place]

## 2020-11-30 LAB — BETA 2 MICROGLOBULIN, SERUM: Beta-2 Microglobulin: 3.4 mg/L — ABNORMAL HIGH (ref 0.6–2.4)

## 2020-11-30 LAB — KAPPA/LAMBDA LIGHT CHAINS
Kappa free light chain: 136.9 mg/L — ABNORMAL HIGH (ref 3.3–19.4)
Kappa, lambda light chain ratio: 9.01 — ABNORMAL HIGH (ref 0.26–1.65)
Lambda free light chains: 15.2 mg/L (ref 5.7–26.3)

## 2020-11-30 IMAGING — CT CT ANGIO CHEST
2 of 6 series · 17 of 36 positions shown · IV contrast (iopamidol)
Comparison: CTA chest 03/03/2018.  CT chest 12/18/2017.

CLINICAL DATA: LEFT chest wall pain in the vicinity of the
Port-A-Cath in the LEFT chest wall which began during chemotherapy
infusion two days ago. Current history of RIGHT breast cancer post
mastectomy in November 2017.

EXAM:
CT ANGIOGRAPHY CHEST WITH CONTRAST
TECHNIQUE: Multidetector CT imaging of the chest was performed using the
standard protocol during bolus administration of intravenous
contrast. Multiplanar CT image reconstructions and MIPs were
obtained to evaluate the vascular anatomy.
CONTRAST:  80mL [id number] IOPAMIDOL INJECTION 76% IV.

[Series 7: pe thins · axial · 0.63mm/px · z∈[+1013,+1264]mm · 16 of 398 slices shown]
[im 20/398  lung]
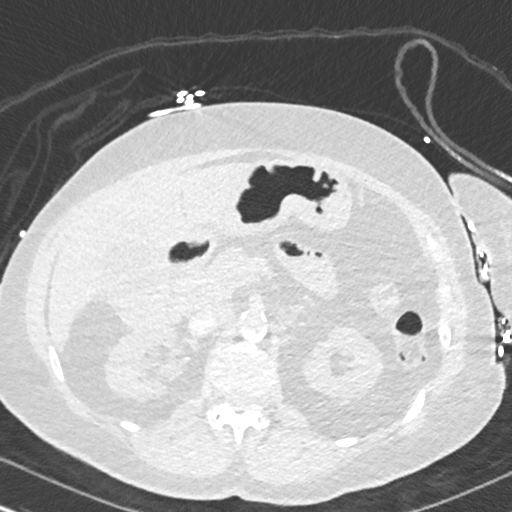
[im 40/398  mediastinal]
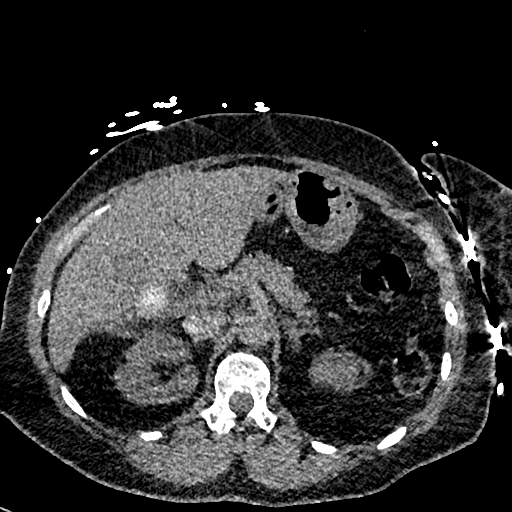
[im 60/398  lung]
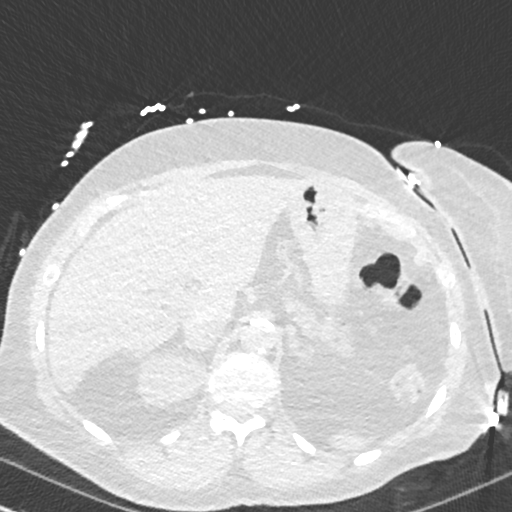
[im 100/398  mediastinal]
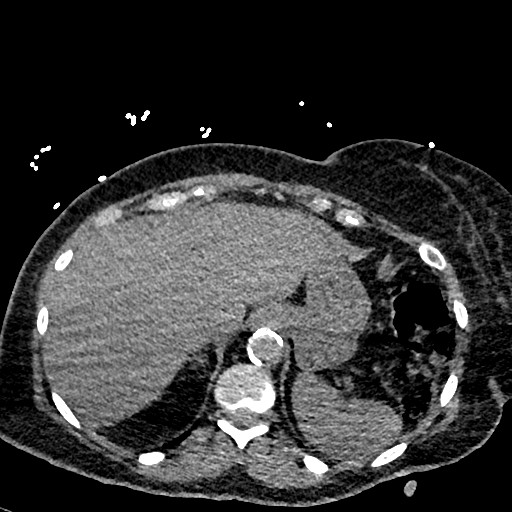
[im 120/398  lung]
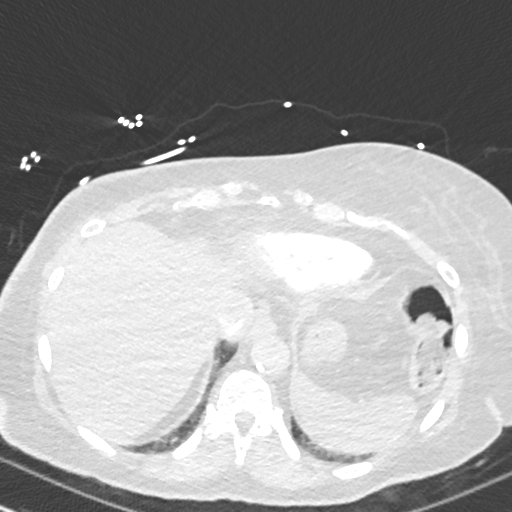
[im 139/398  mediastinal]
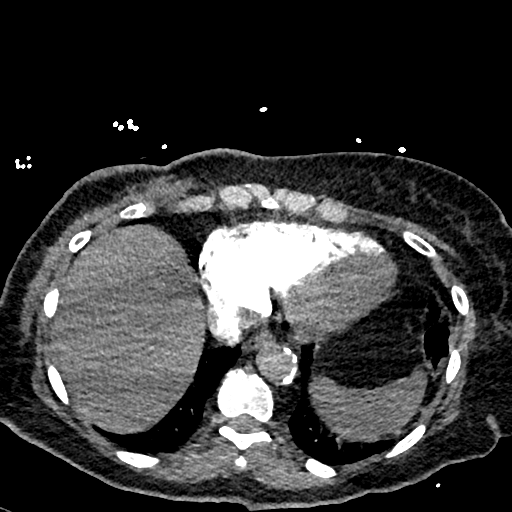
[im 159/398  lung]
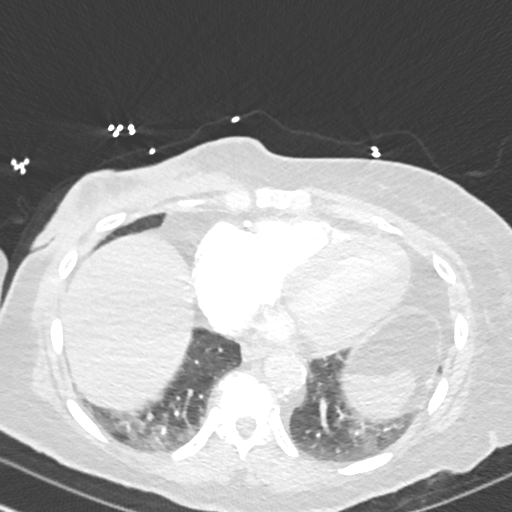
[im 179/398  mediastinal]
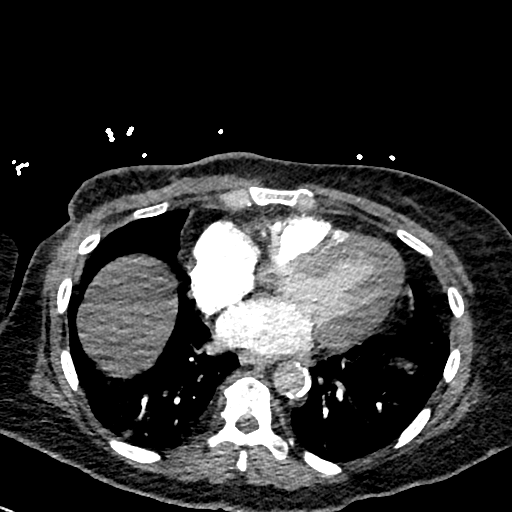
[im 219/398  lung]
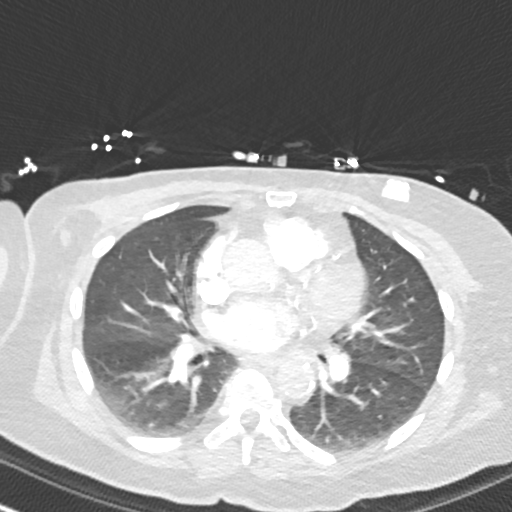
[im 239/398  mediastinal]
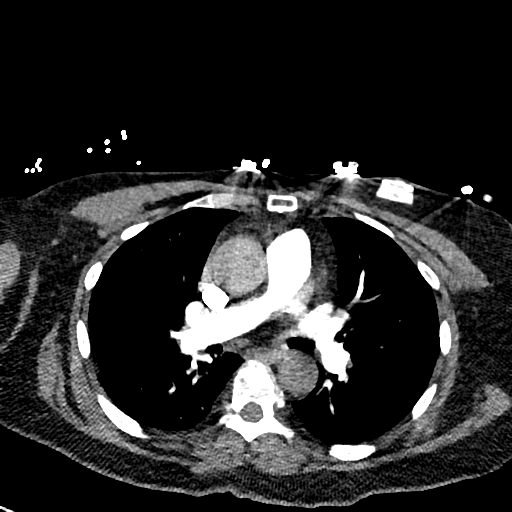
[im 259/398  lung]
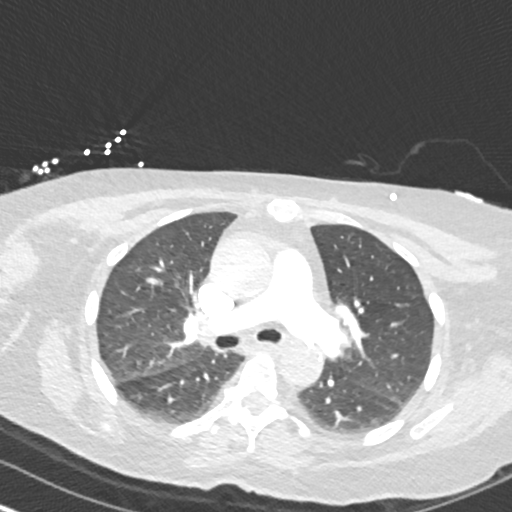
[im 278/398  mediastinal]
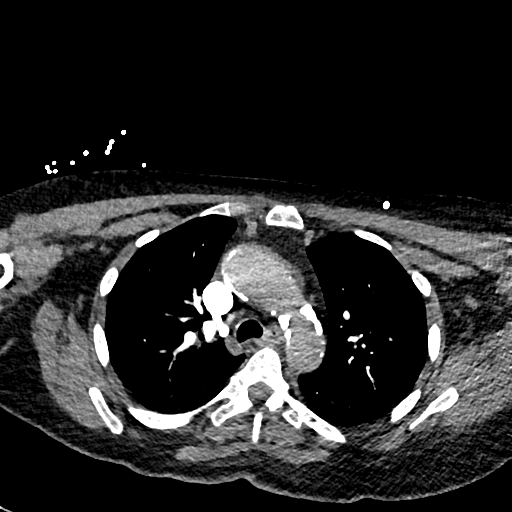
[im 298/398  lung]
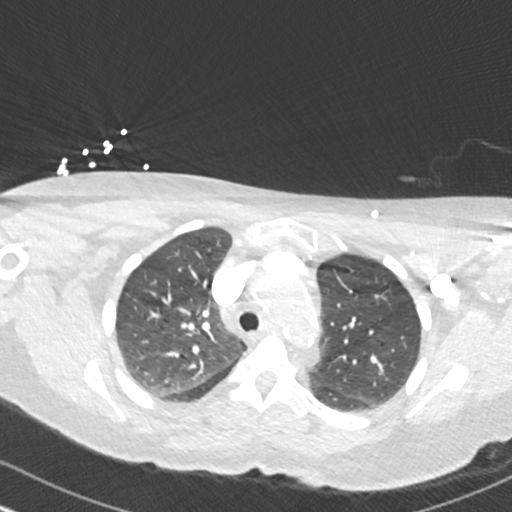
[im 338/398  mediastinal]
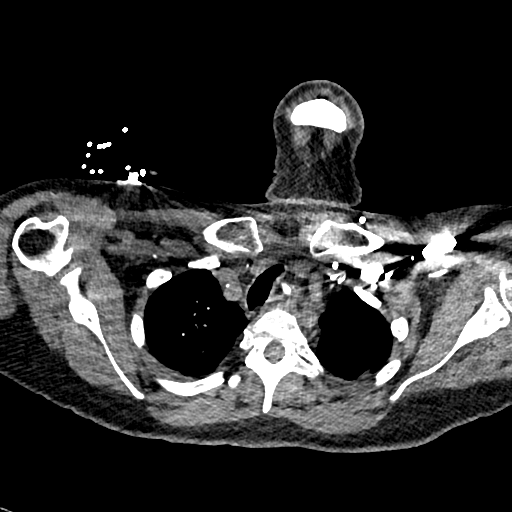
[im 358/398  lung]
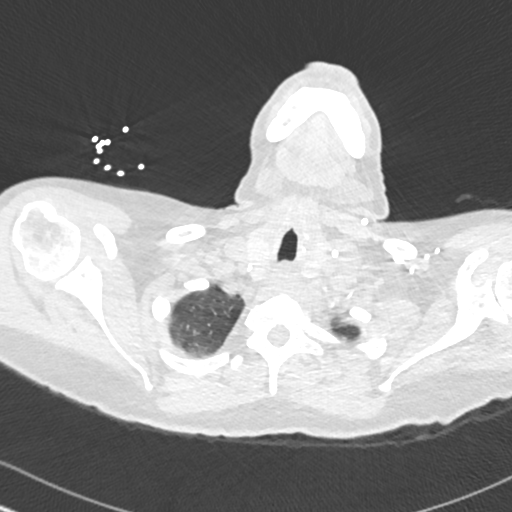
[im 378/398  mediastinal]
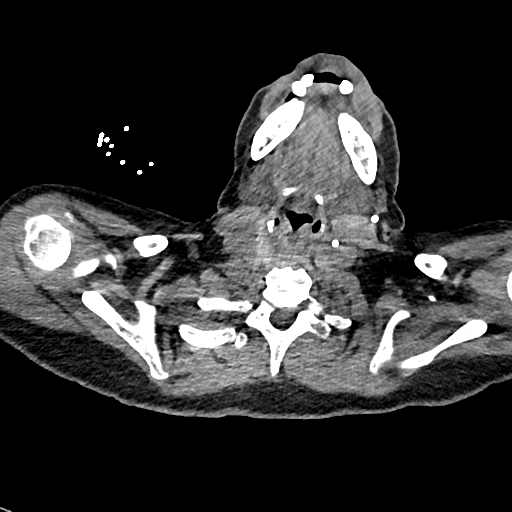

[Series 8: pe 2mm cor · coronal · 0.59mm/px · 1 of 151 slices shown]
[im 76/151  mediastinal]
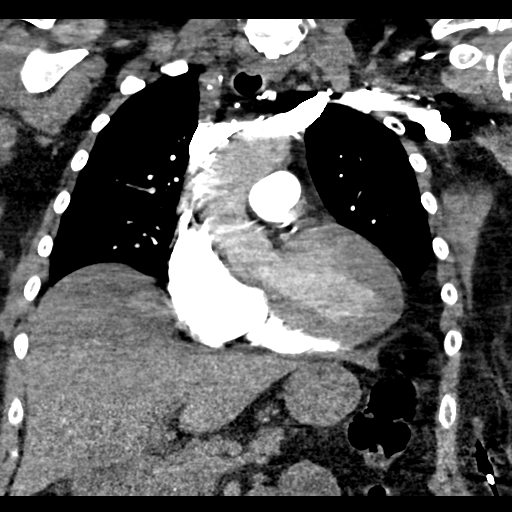

[17 of 36 positions shown; findings below may reference images not displayed]

FINDINGS: Cardiovascular: Contrast opacification of the pulmonary arteries is
excellent, with nearly all of the contrast in the RIGHT heart and
pulmonary arterial system. No filling defects within either main
pulmonary artery or their segmental branches in either lung to
suggest pulmonary embolism. Heart moderately enlarged. Severe
three-vessel coronary atherosclerosis and prior RIGHT coronary
artery stenting no pericardial effusion. Moderate to severe
atherosclerosis involving the thoracic and proximal abdominal aorta.
Ectatic ascending thoracic aorta at 3.8 cm, unchanged since
December 2017. Ectasia involving the aortic arch measuring up to
3.0 cm.

Mediastinum/Nodes: No pathologically enlarged mediastinal, hilar or
axillary lymph nodes. No mediastinal masses. Normal-appearing
esophagus. Normal-appearing thyroid gland.

Lungs/Pleura: Emphysematous changes in the UPPER lobes. Scattered
areas of hyperlucency in both lungs. Scarring involving the lingula,
unchanged. Dependent atelectasis posteriorly in the lower lobes. No
confluent airspace consolidation. No parenchymal nodules or masses.
No pleural effusions. Central airways patent without significant
bronchial wall thickening.

Upper Abdomen: Approximate 2 cm gallstone within the otherwise
normal-appearing gallbladder. Visualized upper abdomen otherwise
unremarkable for the very early arterial phase of enhancement.

Musculoskeletal: Degenerative disc disease and spondylosis involving
the LOWER cervical spine and the LOWER thoracic spine. No acute
findings.

Other: No fluid collection or edema surrounding the port in the LEFT
chest wall. Port-A-Cath tip positioned against the LATERAL wall of
the proximal SVC. No evidence of recurrent tumor in the RIGHT chest
wall post mastectomy.

Review of the MIP images confirms the above findings.
IMPRESSION: 1. No evidence of pulmonary embolism.
2. Diffuse BILATERAL LOWER airways disease as identified on prior
examinations. Stable lingular scarring. No acute cardiopulmonary
disease.
3. Stable cardiomegaly.  Three-vessel coronary atherosclerosis.
4. Ectatic ascending thoracic aorta up to 3.8 cm and ectatic aortic
arch up to 3.0 cm. Recommend annual imaging followup by CTA or MRA.
This recommendation follows 6070
ACCF/AHA/AATS/ACR/ASA/SCA/ZEINAB/NUUSKA/KUOK PAN/BEGAS Guidelines for the
Diagnosis and Management of Patients with Thoracic Aortic Disease.
Circulation.6070; 121: E266-e369. Aortic aneurysm NOS (TL36W-N5W.Q)
5. Cholelithiasis.

Aortic Atherosclerosis (TL36W-5AW.W) and Emphysema (TL36W-0SW.I).

## 2020-11-30 IMAGING — CR DG CHEST 2V
2 series · 2 of 2 positions shown · non-contrast
Comparison: Chest radiograph and chest CT March 03, 2018

CLINICAL DATA: Cough and chest pain.  Breast carcinoma

EXAM:
CHEST - 2 VIEW

[chest lat]
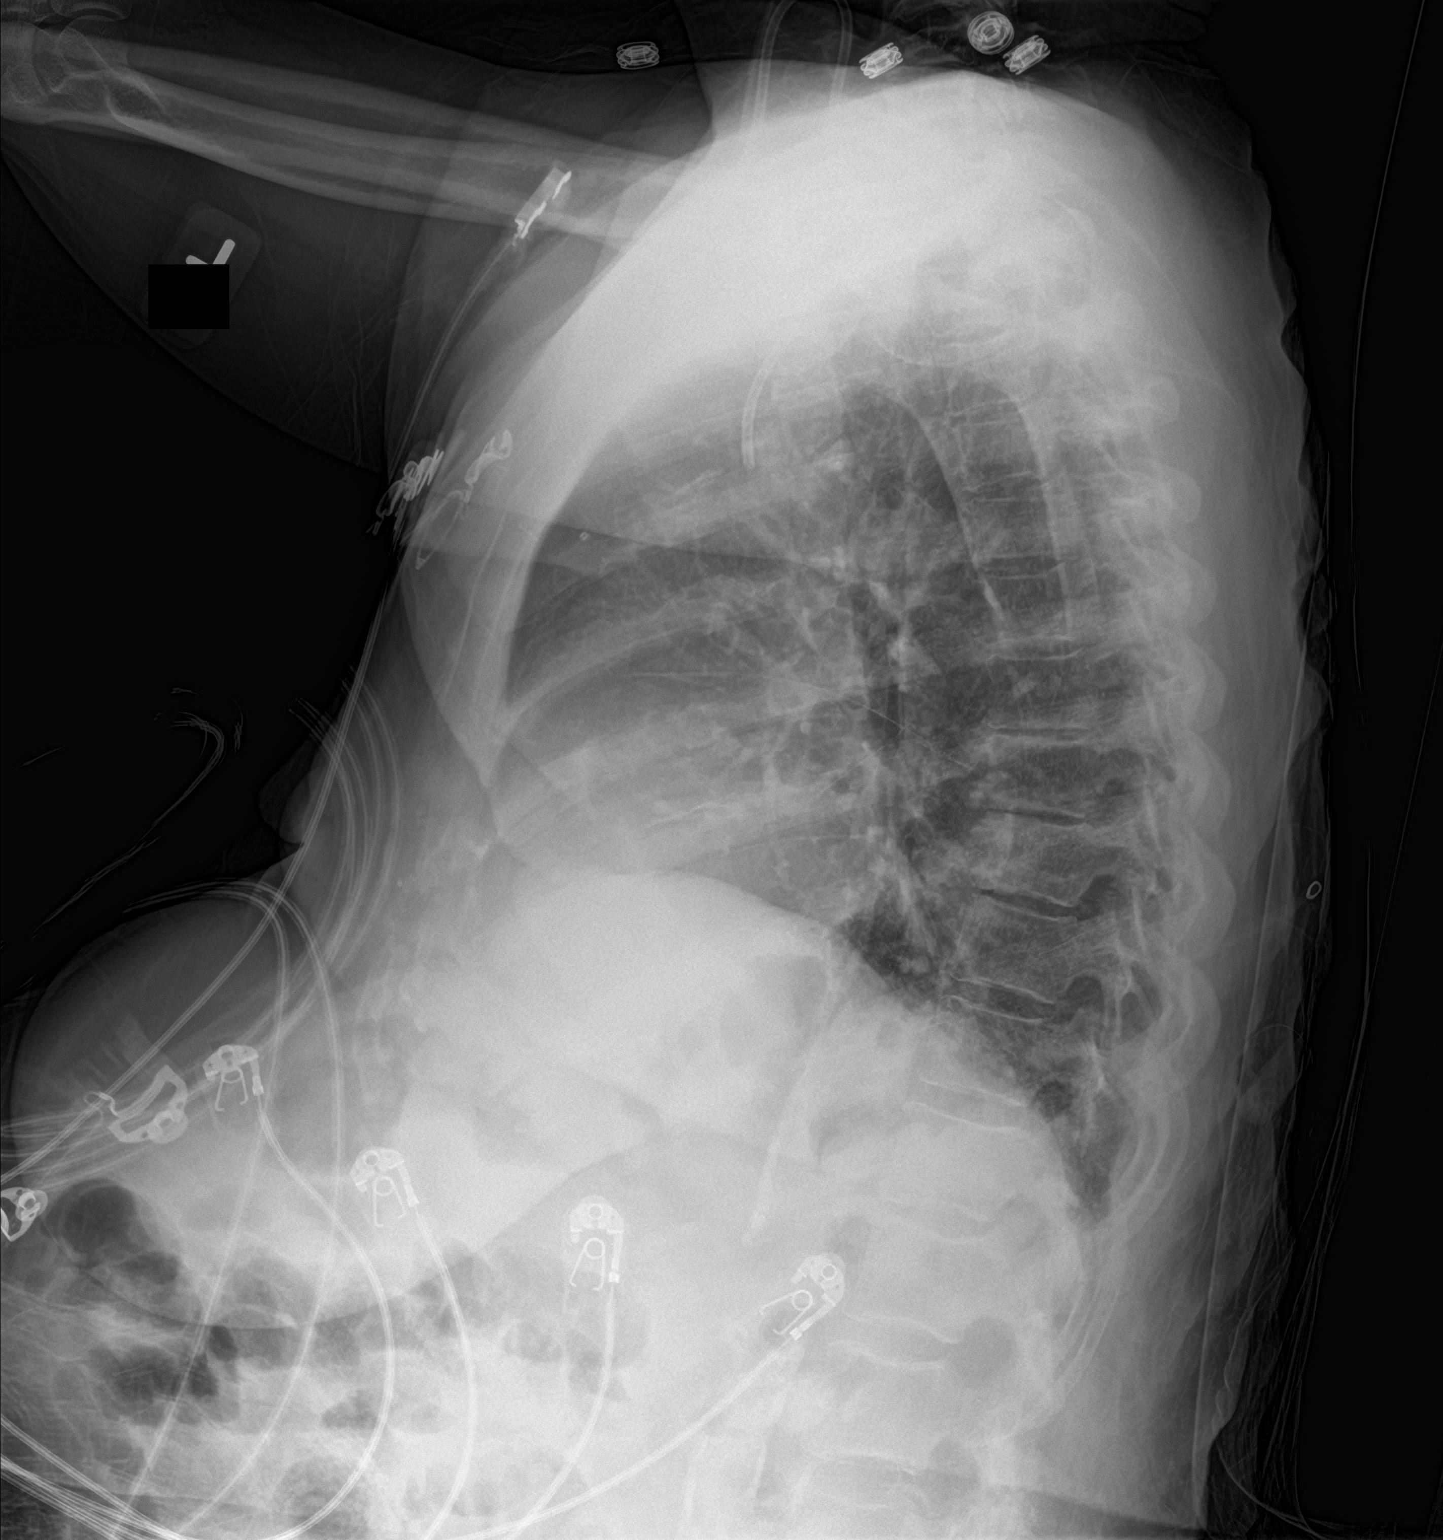

[chest ap]
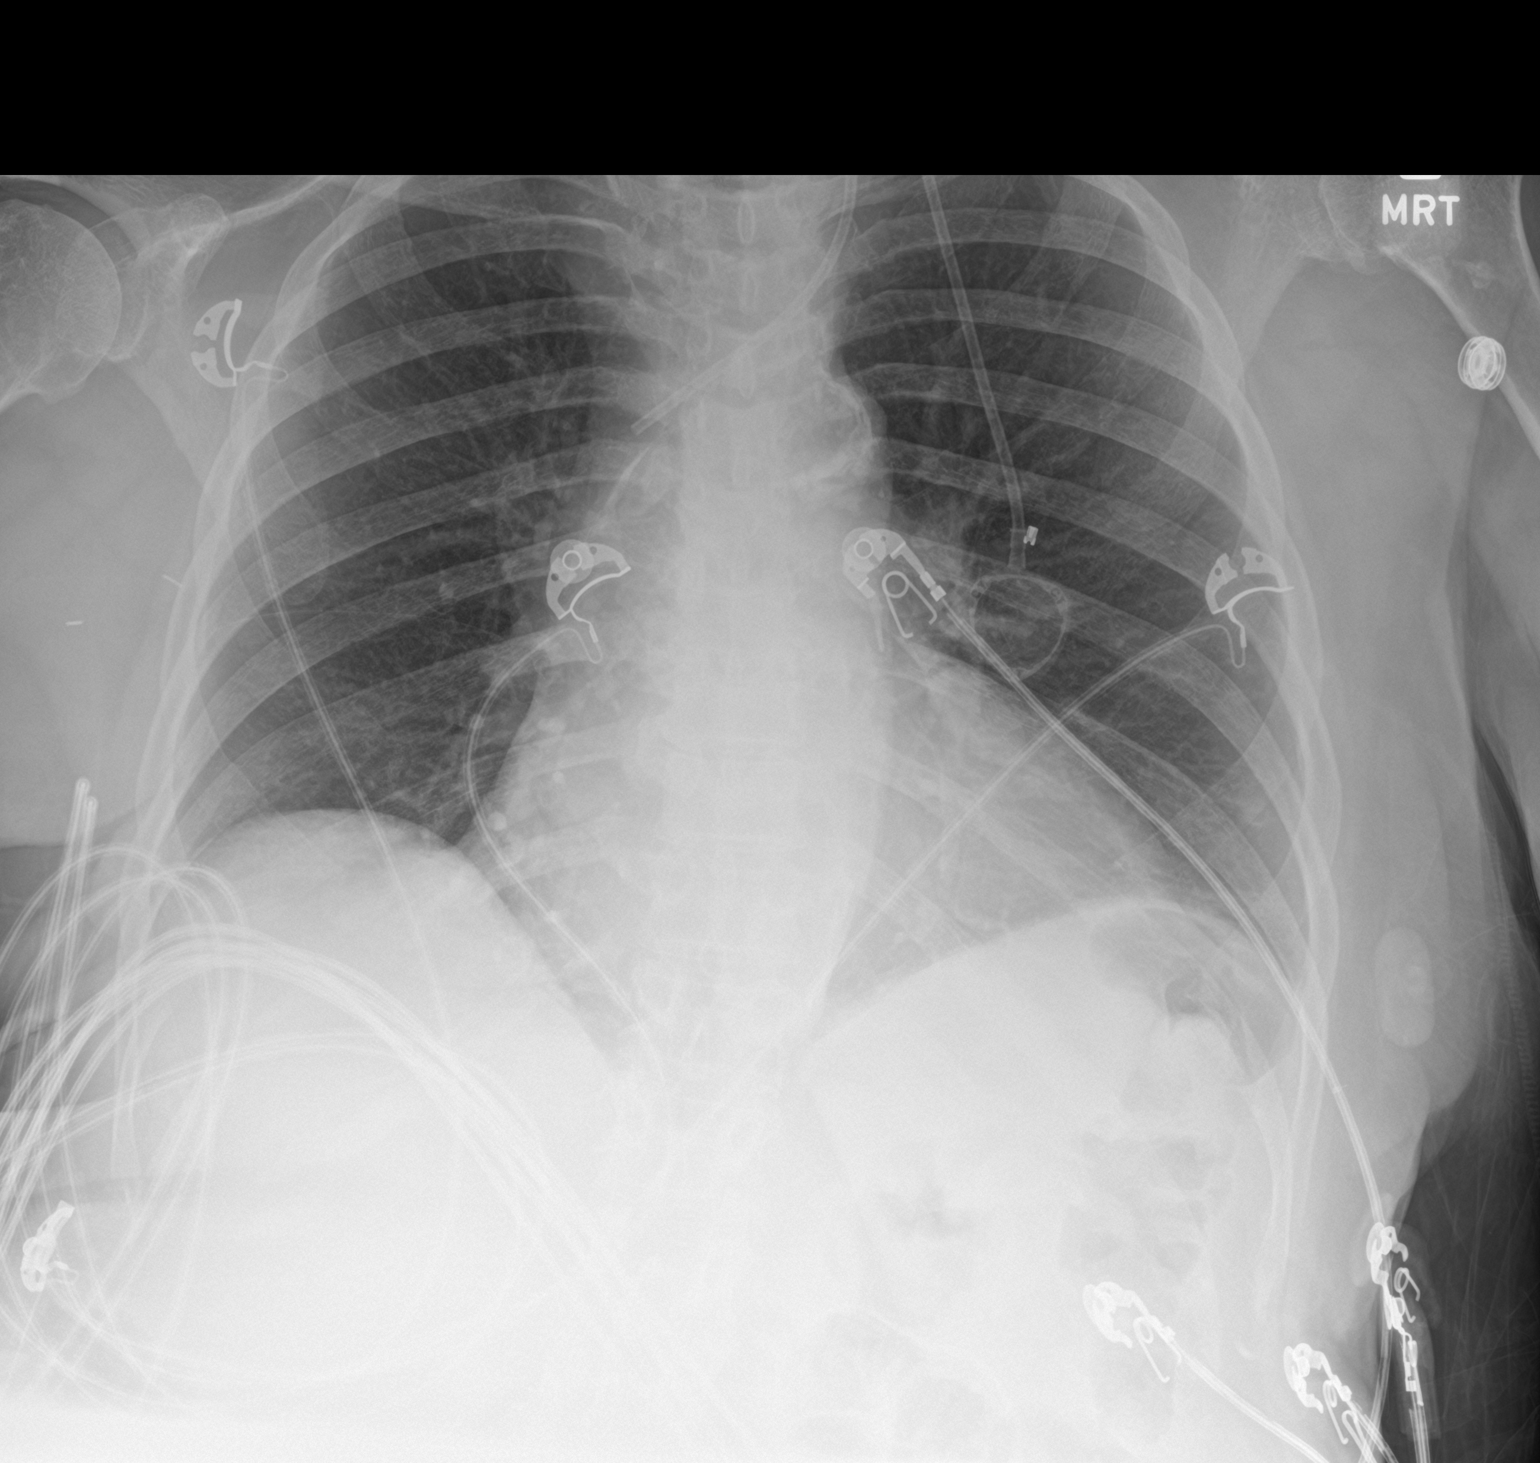

[2 of 2 positions shown; findings below may reference images not displayed]

FINDINGS: Port-A-Cath tip is in the left innominate vein near the junction
with the superior vena cava. No pneumothorax. There is no edema or
consolidation. The heart size and pulmonary vascularity are normal.
No adenopathy. There is aortic atherosclerosis. There is
degenerative change in the left shoulder with calcification in
rotator cuff tendons. There are surgical clips in the right axillary
region.
IMPRESSION: No edema or consolidation. Stable cardiac silhouette. Port-A-Cath
tip is in the left innominate vein. No adenopathy evident. Calcific
tendinosis in the left shoulder.

Aortic Atherosclerosis (FDSJ7-FG0.0).

## 2020-12-01 LAB — MULTIPLE MYELOMA PANEL, SERUM
Albumin SerPl Elph-Mcnc: 3.8 g/dL (ref 2.9–4.4)
Albumin/Glob SerPl: 1 (ref 0.7–1.7)
Alpha 1: 0.2 g/dL (ref 0.0–0.4)
Alpha2 Glob SerPl Elph-Mcnc: 0.9 g/dL (ref 0.4–1.0)
B-Globulin SerPl Elph-Mcnc: 1 g/dL (ref 0.7–1.3)
Gamma Glob SerPl Elph-Mcnc: 1.8 g/dL (ref 0.4–1.8)
Globulin, Total: 3.9 g/dL (ref 2.2–3.9)
IgA: 80 mg/dL (ref 64–422)
IgG (Immunoglobin G), Serum: 2086 mg/dL — ABNORMAL HIGH (ref 586–1602)
IgM (Immunoglobulin M), Srm: 12 mg/dL — ABNORMAL LOW (ref 26–217)
M Protein SerPl Elph-Mcnc: 1.6 g/dL — ABNORMAL HIGH
Total Protein ELP: 7.7 g/dL (ref 6.0–8.5)

## 2020-12-05 ENCOUNTER — Other Ambulatory Visit: Payer: Self-pay | Admitting: Hematology and Oncology

## 2020-12-05 DIAGNOSIS — Z1231 Encounter for screening mammogram for malignant neoplasm of breast: Secondary | ICD-10-CM

## 2020-12-07 ENCOUNTER — Other Ambulatory Visit: Payer: Self-pay | Admitting: Nurse Practitioner

## 2020-12-07 MED ORDER — TRUEPLUS PEN NEEDLES 31G X 8 MM MISC: 1 refills | Status: DC

## 2020-12-07 NOTE — Telephone Encounter (Signed)
Medication Refill - Medication: Insulin Pen Needle (TRUEPLUS PEN NEEDLES) 31G X 8 MM MISC   Has the patient contacted their pharmacy? Yes.   (Agent: If no, request that the patient contact the pharmacy for the refill. If patient does not wish to contact the pharmacy document the reason why and proceed with request.) (Agent: If yes, when and what did the pharmacy advise?) Call pcp needs new RX / expired   Preferred Pharmacy (with phone number or street name): [place], [place] - [address] [facility]  [phone] [address], [place] [address]  Phone:  [phone]  Fax:  [phone]  Has the patient been seen for an appointment in the last year OR does the patient have an upcoming appointment? Yes.    Agent: Please be advised that RX refills may take up to 3 business days. We ask that you follow-up with your pharmacy.

## 2020-12-08 ENCOUNTER — Other Ambulatory Visit: Payer: Self-pay

## 2020-12-08 ENCOUNTER — Ambulatory Visit (HOSPITAL_COMMUNITY)
Admission: RE | Admit: 2020-12-08 | Discharge: 2020-12-08 | Disposition: A | Discharge status: Home / Self Care | Payer: Medicare Other | Source: Ambulatory Visit | Attending: Hematology and Oncology | Admitting: Hematology and Oncology

## 2020-12-08 DIAGNOSIS — C9000 Multiple myeloma not having achieved remission: Secondary | ICD-10-CM | POA: Diagnosis not present

## 2020-12-08 DIAGNOSIS — D472 Monoclonal gammopathy: Secondary | ICD-10-CM | POA: Insufficient documentation

## 2020-12-12 ENCOUNTER — Other Ambulatory Visit: Payer: Self-pay

## 2020-12-12 ENCOUNTER — Ambulatory Visit: Payer: Medicare Other | Attending: Internal Medicine | Admitting: Internal Medicine

## 2020-12-12 ENCOUNTER — Encounter: Payer: Self-pay | Admitting: Internal Medicine

## 2020-12-12 VITALS — BP 128/86 | HR 78 | Resp 16 | Wt 129.8 lb

## 2020-12-12 DIAGNOSIS — M5412 Radiculopathy, cervical region: Secondary | ICD-10-CM

## 2020-12-12 MED ORDER — HYDROCODONE-ACETAMINOPHEN 5-325 MG PO TABS: ORAL_TABLET | ORAL | 0 refills | Status: DC

## 2020-12-12 MED ORDER — PREDNISONE 5 MG PO TABS: 5.0000 mg | ORAL_TABLET | Freq: Every day | ORAL | 0 refills | Status: DC

## 2020-12-12 NOTE — Patient Instructions (Signed)
Continue to use the heating pad to your neck as needed.  Avoid any heavy lifting, pushing or pulling.  I have given a limited supply of hydrocodone to use as needed for pain.  This is a narcotic medication and can cause some drowsiness.  Hold off on taking alprazolam while on this medication.  If you experience increased drowsiness while on this medication, please stop it and give [place] a call.

## 2020-12-12 NOTE — Progress Notes (Signed)
Patient ID: SELIN EISLER, female    DOB: 10-Jun-1950  MRN: [id number]  CC: Neck Pain   Subjective: Kyeshia Zinn is a 70 y.o. female who presents for UC visit.  PCP is NP Raul Del Her concerns today include:  Patient with history of DM type II, HTN, HL, CAD, CKD 4, COPD, MGUS (hematologist Dr. Lindi Adie), breast CA RT breast ERP/PR+ s/p mastectomy and maintenance Arimidex planned until 12/2023, hearing impaired and wears hearing aids  Pt c/o acute RT sided neck pain x a few wks without initiating factors.  She denies any recent falls, heavy lifting or excessive pushing or pulling recently. Pain radiates to RT forearm.  Some tingling in the arm and a little numbness in upper outer arm.   Recently diagnosed with MGUS and had bone survey on 12/08/2020 that showed no lytic lesion but some degenerative changes noted in the cervical, thoracic and lumbar spines. Has been using heating pad,Tiger Balm and Voltaren Gel.  Also takes Tylenol Rates pain as 6/10 and constant and worse with any movement of the neck.  Not able to sleep well at nights because she cannot get comfortable.  She is on low-dose alprazolam twice a day for anxiety which she gets through her cardiologist Lives with her 18 yr old grandson and his family  Patient Active Problem List   Diagnosis Date Noted   MGUS (monoclonal gammopathy of unknown significance) 11/29/2020   ESRD (end stage renal disease) ([place]) 03/15/2020   Insulin dependent type 2 diabetes mellitus ([place]) 12/09/2018   COPD (chronic obstructive pulmonary disease) ([place]) 12/09/2018   Chronic cholecystitis 11/11/2018   Hemobilia    Dilated bile duct    Jaundice    Elevated LFTs    Choledocholithiasis 10/02/2018   CKD (chronic kidney disease), stage III    Acute cholecystitis    Elevated liver enzymes 09/18/2018   Calculus of gallbladder and bile duct with acute cholecystitis, with obstruction    Melena    Unstable angina ([place]) 07/12/2018   Port-A-Cath in place  02/27/2018   Cancer of overlapping sites of right breast ([place]) 11/27/2017   Malignant neoplasm of upper-outer quadrant of right breast in female, estrogen receptor positive ([place]) 10/03/2017   Carpal tunnel syndrome, bilateral 11/26/2016   Ulnar neuropathy at elbow, 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   Abdominal pain, epigastric    Acute on chronic renal failure ([place]) 02/19/2015   Diarrhea    Nausea with vomiting    Hyperglycemia 04/01/2014   Hyperosmolar non-ketotic state in patient with type 2 diabetes mellitus ([place]) 04/01/2014   Nausea vomiting and diarrhea 04/01/2014   CAD (coronary artery disease) 04/01/2014   Dehydration    Diabetic ketoacidosis without coma associated with diabetes mellitus due to underlying condition ([place])    High anion gap metabolic acidosis    Cocaine abuse ([place])    Toxic encephalopathy-Unlikely metabolic,?psychogenic 08/02/2012   Bereavement 02/02/2012   Depression 02/02/2012   Iron deficiency anemia, unspecified 11/30/2011   Nonspecific abnormal finding in stool contents 11/30/2011   Benign neoplasm of colon 11/30/2011   Cough 05/14/2011   Chest pain 03/19/2011   Macrocytic anemia 03/19/2011   Type 2 diabetes mellitus with complication, without long-term current use of insulin ([place]) 03/19/2011   DOE (dyspnea on exertion) 03/19/2011   Chest pain at rest 02/18/2011    Class: Acute   Cholelithiasis 11/01/2010   DM 04/08/2009   HYPERLIPIDEMIA 04/08/2009   HTN (hypertension) 04/08/2009   MYOCARDIAL INFARCTION 04/08/2009   Coronary atherosclerosis 04/08/2009  GERD 04/08/2009   FATTY LIVER DISEASE 04/08/2009   COLONIC POLYPS, HX OF 04/08/2009     Current Outpatient Medications on File Prior to Visit  Medication Sig Dispense Refill   acetaminophen (TYLENOL) 500 MG tablet Take 1 tablet (500 mg total) by mouth every 6 (six) hours as needed. 30 tablet 0   albuterol (PROVENTIL) (2.5 MG/3ML) 0.083% nebulizer solution Take 3 mLs (2.5 mg total) by nebulization every 6 (six)  hours as needed for wheezing or shortness of breath. 75 mL 3   ALPRAZolam (XANAX) 0.25 MG tablet Take 0.25 mg by mouth at bedtime.     amLODipine (NORVASC) 10 MG tablet Take 1 tablet (10 mg total) by mouth daily.     anastrozole (ARIMIDEX) 1 MG tablet Take 1 tablet (1 mg total) by mouth daily. 90 tablet 3   aspirin 81 MG EC tablet Take 1 tablet (81 mg total) by mouth daily. 30 tablet 0   atorvastatin (LIPITOR) 20 MG tablet Take 1 tablet (20 mg total) by mouth daily at 6 PM. 90 tablet 1   Blood Pressure Monitor DEVI Please provide patient with insurance approved blood pressure monitor. I10.0 1 each 0   budesonide-formoterol (SYMBICORT) 160-4.5 MCG/ACT inhaler Inhale 1 puff into the lungs 2 (two) times daily as needed (respiratory issues.). 1 each 6   diclofenac Sodium (VOLTAREN) 1 % GEL Apply 4 g topically 4 (four) times daily. 100 g 1   Insulin Pen Needle (TRUEPLUS PEN NEEDLES) 31G X 8 MM MISC Inject into the skin once nightly.  E11.65 100 each 1   LANTUS SOLOSTAR 100 UNIT/ML Solostar Pen Inject 5 Units into the skin at bedtime. E11.65 6 mL 1   losartan (COZAAR) 25 MG tablet TAKE ONE TABLET BY MOUTH DAILY 90 tablet 0   metoprolol succinate (TOPROL-XL) 50 MG 24 hr tablet TAKE ONE TABLET BY MOUTH EVERY DAY *TAKE WITH OR IMMEDIATELY FOLLOWING A MEAL* 90 tablet 0   Misc. Devices MISC Please provide patient with nebulizer mask and tubing. ICD-10J44.9 1 each 0   Multiple Vitamin (MULTIVITAMIN WITH MINERALS) TABS tablet Take 1 tablet by mouth daily. [place]     nitroGLYCERIN (NITROSTAT) 0.4 MG SL tablet Place 1 tablet (0.4 mg total) under the tongue every 5 (five) minutes as needed for chest pain. 10 tablet 0   Omega-3 Fatty Acids (FISH OIL) 1000 MG CAPS Take 1,000 mg by mouth daily.     pantoprazole (PROTONIX) 40 MG tablet Take 1 tablet (40 mg total) by mouth daily. 90 tablet 0   VICTOZA 18 MG/3ML SOPN INJECT 1.8 MG INTO THE SKIN DAILY 9 mL 2   No current facility-administered medications on file prior  to visit.    No Known Allergies  Social History   Socioeconomic History   Marital status: Divorced    Spouse name: Not on file   Number of children: 2   Years of education: Not on file   Highest education level: Not on file  Occupational History   Occupation: retired. prev worked in housekeeping w/ [employer]  Tobacco Use   Smoking status: Every Day    Packs/day: 0.50    Years: 47.00    Pack years: 23.50    Types: Cigarettes    Last attempt to quit: 05/09/2010    Years since quitting: 10.6   Smokeless tobacco: Never  Vaping Use   Vaping Use: Never used  Substance and Sexual Activity   Alcohol use: Yes   Drug use: Not Currently  Types: Cocaine    Comment: positive drug screen (cocaine, benzo's)07/2012 in emergency room   Sexual activity: Not Currently    Birth control/protection: Post-menopausal  Other Topics Concern   Not on file  Social History Narrative   Caffeine use daily.   Social Determinants of Health   Financial Resource Strain: Not on file  Food Insecurity: Not on file  Transportation Needs: Not on file  Physical Activity: Not on file  Stress: Not on file  Social Connections: Not on file  Intimate Partner Violence: Not on file    Family History  Problem Relation Age of Onset   Heart disease Mother    Diabetes Mother    Cancer Father        unsure what kind   Diabetes Sister    Colon cancer Neg Hx    Breast cancer Neg Hx     Past Surgical History:  Procedure Laterality Date   ABDOMINAL ANGIOGRAM  02/20/2011   Procedure: ABDOMINAL ANGIOGRAM;  Surgeon: Clent Demark, MD;  Location: [facility];  Service: Cardiovascular;;   BREAST BIOPSY Left    CARPAL TUNNEL RELEASE Right 07/04/2017   Procedure: RIGHT CARPAL TUNNEL RELEASE;  Surgeon: Leanora Cover, MD;  Location: [facility];  Service: Orthopedics;  Laterality: Right;   CARPAL TUNNEL RELEASE Left 09/12/2017   Procedure: LEFT CARPAL TUNNEL RELEASE;  Surgeon: Leanora Cover, MD;  Location:  [facility];  Service: Orthopedics;  Laterality: Left;   CHOLECYSTECTOMY N/A 11/11/2018   Procedure: ATTEMPTED LAPAROSCOPIC CHOLECUSTECOMY,  OPEN CHOLECYSTECTOMY;  Surgeon: Coralie Keens, MD;  Location: [facility];  Service: General;  Laterality: N/A;   COLONOSCOPY  11/30/2011   Procedure: COLONOSCOPY;  Surgeon: Lafayette Dragon, MD;  Location: [facility];  Service: Endoscopy;  Laterality: N/A;   CORONARY ANGIOPLASTY WITH STENT PLACEMENT  02/20/2011   ERCP N/A 10/03/2018   Procedure: ENDOSCOPIC RETROGRADE CHOLANGIOPANCREATOGRAPHY (ERCP);  Surgeon: Ladene Artist, MD;  Location: [facility];  Service: Endoscopy;  Laterality: N/A;   ESOPHAGOGASTRODUODENOSCOPY  11/30/2011   Procedure: ESOPHAGOGASTRODUODENOSCOPY (EGD);  Surgeon: Lafayette Dragon, MD;  Location: [facility];  Service: Endoscopy;  Laterality: N/A;   ESOPHAGOGASTRODUODENOSCOPY N/A 02/23/2015   Procedure: ESOPHAGOGASTRODUODENOSCOPY (EGD);  Surgeon: Gatha Mayer, MD;  Location: [facility] ENDOSCOPY;  Service: Endoscopy;  Laterality: N/A;   EYE SURGERY Bilateral    cataracts removed   INTRAOPERATIVE CHOLANGIOGRAM N/A 11/11/2018   Procedure: Intraoperative Cholangiogram;  Surgeon: Coralie Keens, MD;  Location: [facility];  Service: General;  Laterality: N/A;   IR REMOVAL TUN ACCESS W/ PORT W/O FL MOD SED  06/30/2020   LEFT HEART CATHETERIZATION WITH CORONARY ANGIOGRAM N/A 02/20/2011   Procedure: LEFT HEART CATHETERIZATION WITH CORONARY ANGIOGRAM;  Surgeon: Clent Demark, MD;  Location: [facility];  Service: Cardiovascular;  Laterality: N/A;   MASTECTOMY Right 11/05/2017   MASTECTOMY W/ SENTINEL NODE BIOPSY Right 11/27/2017   MASTECTOMY W/ SENTINEL NODE BIOPSY Right 11/27/2017   Procedure: RIGHT TOTAL MASTECTOMY WITH SENTINEL LYMPH NODE BIOPSY;  Surgeon: Excell Seltzer, MD;  Location: [facility];  Service: General;  Laterality: Right;   PORT A CATH REVISION Left 05/07/2018   Procedure: PORT A CATH REVISION;  Surgeon: Excell Seltzer,  MD;  Location: [facility];  Service: General;  Laterality: Left;   PORTACATH PLACEMENT Left 11/27/2017   Procedure: INSERTION PORT-A-CATH;  Surgeon: Excell Seltzer, MD;  Location: [facility];  Service: General;  Laterality: Left;   RIGHT COLECTOMY  02/2007   for post polypectomy colonic  perforation   SPHINCTEROTOMY  10/03/2018   Procedure: SPHINCTEROTOMY;  Surgeon: Ladene Artist, MD;  Location: [facility];  Service: Endoscopy;;   TUBAL LIGATION      ROS: Review of Systems Negative except as stated above  PHYSICAL EXAM: BP 128/86   Pulse 78   Resp 16   Wt 129 lb 12.8 oz (58.9 kg)   LMP  (LMP Unknown) Comment: tubal ligation  SpO2 100%   BMI 26.22 kg/m   Physical Exam  General appearance - alert, well appearing patient appears very uncomfortable with any neck movement Mental status - normal mood, behavior, speech, dress, motor activity, and thought processes Chest -some crackles at the base of both lungs but of the lung fields are clear CVS: Regular rate and rhythm.  No gallops or murmurs, no JVD Extremities: No edema in the lower legs. MSK: Moderate tenderness on palpation of the cervical spine and the right-sided trapezius muscle. Neurological -decreased sensation to gross touch on the upper arm and forearm on the right side compared to the left.  Grip 4+/5 right and 5/5 on the left.  Strength proximally and distally in both upper extremities 5/5.   CMP Latest Ref Rng & Units 11/29/2020 10/13/2020 03/15/2020  Glucose 70 - 99 mg/dL 78 93 92  BUN 8 - 23 mg/dL 26(H) 26 16  Creatinine 0.44 - 1.00 mg/dL 2.30(H) 2.36(H) 1.65(H)  Sodium 135 - 145 mmol/L 141 140 142  Potassium 3.5 - 5.1 mmol/L 4.7 4.8 4.6  Chloride 98 - 111 mmol/L 109 101 103  CO2 22 - 32 mmol/L 24 25 21   Calcium 8.9 - 10.3 mg/dL 9.7 10.7(H) 10.7(H)  Total Protein 6.5 - 8.1 g/dL 8.2(H) 8.6(H) 8.3  Total Bilirubin 0.3 - 1.2 mg/dL 0.6 0.8 0.5  Alkaline Phos 38 - 126 U/L 113 118 111  AST 15 - 41 U/L 24 14 12   ALT 0 - 44  U/L 27 11 13    Lipid Panel     Component Value Date/Time   CHOL 116 03/15/2020 1154   TRIG 124 03/15/2020 1154   HDL 43 03/15/2020 1154   CHOLHDL 2.7 03/15/2020 1154   CHOLHDL 2.5 07/13/2018 0428   VLDL 20 07/13/2018 0428   LDLCALC 51 03/15/2020 1154    CBC    Component Value Date/Time   WBC 6.6 11/29/2020 1413   WBC 9.8 12/08/2018 2045   RBC 3.90 11/29/2020 1413   HGB 12.0 11/29/2020 1413   HGB 14.0 03/15/2020 1154   HCT 37.0 11/29/2020 1413   HCT 41.9 03/15/2020 1154   PLT 263 11/29/2020 1413   PLT 283 03/15/2020 1154   MCV 94.9 11/29/2020 1413   MCV 92 03/15/2020 1154   MCH 30.8 11/29/2020 1413   MCHC 32.4 11/29/2020 1413   RDW 13.7 11/29/2020 1413   RDW 13.3 03/15/2020 1154   LYMPHSABS 2.4 11/29/2020 1413   MONOABS 0.7 11/29/2020 1413   EOSABS 0.2 11/29/2020 1413   BASOSABS 0.1 11/29/2020 1413    ASSESSMENT AND PLAN: 1. Cervical radiculopathy I told patient that she most likely has a pinched nerve or slipped disc in the neck. We will try her with conservative management. Encouraged to continue using the heating pad. I will give 3 days of low-dose prednisone.  Advised patient that she may see a bump in her blood sugars because of this. I have given a limited supply of Norco for her to take half a tablet twice a day as needed.  I have informed patient that this  is a narcotic medication that can cause drowsiness.  If she has significant drowsiness, I recommend that she stop the medication and let us know.  I have also advised that she hold off on taking alprazolam with this medication.  [place] controlled substance reporting system reviewed. Follow-up with her PCP in about 2 weeks.  If no improvement or any worsening, she will need an MRI. - predniSONE (DELTASONE) 5 MG tablet; Take 1 tablet (5 mg total) by mouth daily with breakfast.  Dispense: 3 tablet; Refill: 0 - HYDROcodone-acetaminophen (NORCO) 5-325 MG tablet; Take 1/2 tab PO twice a day PRN pain   Dispense: 15 tablet; Refill: 0   Patient was given the opportunity to ask questions.  Patient verbalized understanding of the plan and was able to repeat key elements of the plan.   No orders of the defined types were placed in this encounter.    Requested Prescriptions   Signed Prescriptions Disp Refills   predniSONE (DELTASONE) 5 MG tablet 3 tablet 0    Sig: Take 1 tablet (5 mg total) by mouth daily with breakfast.   HYDROcodone-acetaminophen (NORCO) 5-325 MG tablet 15 tablet 0    Sig: Take 1/2 tab PO twice a day PRN pain    Return in about 2 weeks (around 12/26/2020) for Zel[place].  Karle Plumber, MD, FACP

## 2020-12-13 ENCOUNTER — Telehealth: Payer: Self-pay | Admitting: Nurse Practitioner

## 2020-12-13 DIAGNOSIS — M5412 Radiculopathy, cervical region: Secondary | ICD-10-CM

## 2020-12-13 NOTE — Telephone Encounter (Signed)
Pt called stating that she is looking to have her Hydrocodone refilled. Medication was attempted to be sent to pharmacy, but it was sent in as a "print" instead of through fax. Please advise .

## 2020-12-13 NOTE — Telephone Encounter (Signed)
Patient called in about prednisone and HYDROcodone-acetaminophen ([place]) 5-325 MG tablet  that was prescribed yesterday by Dr Wynetta Emery. She says the pharacy says they dont take written prescriptions. Please call back.

## 2020-12-14 ENCOUNTER — Telehealth (INDEPENDENT_AMBULATORY_CARE_PROVIDER_SITE_OTHER): Payer: Self-pay

## 2020-12-14 MED ORDER — HYDROCODONE-ACETAMINOPHEN 5-325 MG PO TABS
1.0000 | ORAL_TABLET | Freq: Every day | ORAL | 0 refills | Status: DC | PRN
Start: 2020-12-14 — End: 2021-01-03

## 2020-12-14 NOTE — Telephone Encounter (Signed)
Pt returned call.. Pt states that her pharmacy doesn't take hard scripts for rx.. Will contact pharmacy to clarify

## 2020-12-14 NOTE — Telephone Encounter (Signed)
Copied from [place] [id number]. Topic: General - Call Back - No Documentation >> Dec 14, 2020 11:04 AM Erick Blinks wrote: Reason for CRM: Alfonse Spruce calling from [place] needs clarification for the diagnosis -she wants to know if she can do more than 5 days  [place], [place] - [address] [place] [address] Phone: [phone] Fax: [phone]

## 2020-12-14 NOTE — Telephone Encounter (Signed)
Will forward to provider for clarification.

## 2020-12-14 NOTE — Telephone Encounter (Signed)
Contacted [facility] on [address] and spoke to [facility] the pharmacist and per [name] they are not allowed to take hard scripts for narcotics they have to be sent in electronically. Asked [name] if I can do a verbal and per [name] I can not.

## 2020-12-14 NOTE — Telephone Encounter (Signed)
Returned call to pharmacy and spoke to the pharmacist wasn't able to get name. Provided dx code and per pharmacist since pt hasn't had pain medication in years their only allowed to do 5 days worth.Marland KitchenMarland Kitchen

## 2020-12-14 NOTE — Telephone Encounter (Signed)
Contacted pt to go over provider message pt didn't answer lvm

## 2020-12-14 NOTE — Telephone Encounter (Signed)
Returned pt call and made aware that rx has been sent

## 2020-12-14 NOTE — Progress Notes (Signed)
Patient Care Team: Gildardo Pounds, NP as PCP - General (Nurse Practitioner) Nicholas Lose, MD as Consulting Physician (Hematology and Oncology)  DIAGNOSIS:    ICD-10-CM   1. Malignant neoplasm of upper-outer quadrant of right breast in female, estrogen receptor positive ([place])  C50.411    Z17.0       SUMMARY OF ONCOLOGIC HISTORY: Oncology History  Malignant neoplasm of upper-outer quadrant of right breast in female, estrogen receptor positive ([place])  09/03/2017 Initial Diagnosis   Screening mammogram detected 1.4 cm right breast mass at 10 o'clock position, additional pleomorphic calcifications 2 cm posteriorly, additional loosely grouped calcifications 10.1 x 7.1 x 5.3 cm, single right axillary lymph node: Biopsy revealed DCIS ER 100%, PR 50%; IDC grade 2-3 ER 100%, PR 50%, Ki-67 15%, HER-2 positive ratio 2, copy #5, lymph node negative T2 N0 stage Ia clinical stage AJCC 8   09/03/2017 Cancer Staging   Staging form: Breast, AJCC 8th Edition - Clinical stage from 09/03/2017: Stage IB (cT2, cN0, cM0, G3, ER+, PR+, HER2+)   11/27/2017 Surgery   Right mastectomy ([facility]) ([id number]): IDC grade 2, 1.6 cm, separate foci of DCIS intermediate grade, margins negative, 1/5 LN positive for micrometasatic carcinoma. ER 100%, PR 50%, Ki-67 15%, HER-2 positive ratio 2, copy #5 T1c N0 stage Ia   11/27/2017 Cancer Staging   Staging form: Breast, AJCC 8th Edition - Pathologic stage from 11/27/2017: Stage IA (pT1c, pN0, cM0, G2, ER+, PR+, HER2+)     02/07/2018 - 03/12/2019 Chemotherapy   trastuzumab (HERCEPTIN) 483 mg in sodium chloride 0.9 % 250 mL chemo infusion, 8 mg/kg = 483 mg, Intravenous,  Once, 11 of 11 cycles. Administration: 483 mg (02/07/2018), 357 mg (02/27/2018), 357 mg (05/01/2018), 357 mg (05/23/2018), 357 mg (06/12/2018), 357 mg (07/03/2018), 357 mg (08/14/2018), 357 mg (09/04/2018), 357 mg (10/17/2018), 357 mg (03/20/2018), 357 mg (04/10/2018)  trastuzumab-anns (KANJINTI) 357 mg in sodium chloride  0.9 % 250 mL chemo infusion, 6 mg/kg = 357 mg (100 % of original dose 6 mg/kg), Intravenous,  Once, 7 of 7 cycles. Dose modification: 6 mg/kg (original dose 6 mg/kg, Cycle 12, Reason: Other (see comments)). Administration: 357 mg (11/06/2018), 357 mg (11/27/2018), 357 mg (12/18/2018), 357 mg (01/08/2019), 357 mg (01/29/2019), 357 mg (02/19/2019), 357 mg (03/12/2019).   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 She presents to the clinic today for follow-up.  She had extensive blood work and is here today to discuss results.  She does not report any new problems or concerns.  ALLERGIES:  has No Known Allergies.  MEDICATIONS:  Current Outpatient Medications  Medication Sig Dispense Refill   acetaminophen (TYLENOL) 500 MG tablet Take 1 tablet (500 mg total) by mouth every 6 (six) hours as needed. 30 tablet 0   albuterol (PROVENTIL) (2.5 MG/3ML) 0.083% nebulizer solution Take 3 mLs (2.5 mg total) by nebulization every 6 (six) hours as needed for wheezing or shortness of breath. 75 mL 3   ALPRAZolam (XANAX) 0.25 MG tablet Take 0.25 mg by mouth at bedtime.     amLODipine (NORVASC) 10 MG tablet Take 1 tablet (10 mg total) by mouth daily.     anastrozole (ARIMIDEX) 1 MG tablet Take 1 tablet (1 mg total) by mouth daily. 90 tablet 3   aspirin 81 MG EC tablet Take 1 tablet (  81 mg total) by mouth daily. 30 tablet 0   atorvastatin (LIPITOR) 20 MG tablet Take 1 tablet (20 mg total) by mouth daily at 6 PM. 90 tablet 1   Blood Pressure Monitor DEVI Please provide patient with insurance approved blood pressure monitor. I10.0 1 each 0   budesonide-formoterol (SYMBICORT) 160-4.5 MCG/ACT inhaler Inhale 1 puff into the lungs 2 (two) times daily as needed (respiratory issues.). 1 each 6   diclofenac Sodium (VOLTAREN) 1 % GEL Apply 4  g topically 4 (four) times daily. 100 g 1   HYDROcodone-acetaminophen (NORCO) 5-325 MG tablet Take 1 tablet by mouth daily as needed for moderate pain. Patient advised to Take 1/2 tab PO twice a day which is equivalent to 1 tablet daily PRN pain 15 tablet 0   Insulin Pen Needle (TRUEPLUS PEN NEEDLES) 31G X 8 MM MISC Inject into the skin once nightly.  E11.65 100 each 1   LANTUS SOLOSTAR 100 UNIT/ML Solostar Pen Inject 5 Units into the skin at bedtime. E11.65 6 mL 1   losartan (COZAAR) 25 MG tablet TAKE ONE TABLET BY MOUTH DAILY 90 tablet 0   metoprolol succinate (TOPROL-XL) 50 MG 24 hr tablet TAKE ONE TABLET BY MOUTH EVERY DAY *TAKE WITH OR IMMEDIATELY FOLLOWING A MEAL* 90 tablet 0   Misc. Devices MISC Please provide patient with nebulizer mask and tubing. ICD-10J44.9 1 each 0   Multiple Vitamin (MULTIVITAMIN WITH MINERALS) TABS tablet Take 1 tablet by mouth daily. Sentry     nitroGLYCERIN (NITROSTAT) 0.4 MG SL tablet Place 1 tablet (0.4 mg total) under the tongue every 5 (five) minutes as needed for chest pain. 10 tablet 0   Omega-3 Fatty Acids (FISH OIL) 1000 MG CAPS Take 1,000 mg by mouth daily.     pantoprazole (PROTONIX) 40 MG tablet Take 1 tablet (40 mg total) by mouth daily. 90 tablet 0   predniSONE (DELTASONE) 5 MG tablet Take 1 tablet (5 mg total) by mouth daily with breakfast. 3 tablet 0   VICTOZA 18 MG/3ML SOPN INJECT 1.8 MG INTO THE SKIN DAILY 9 mL 2   No current facility-administered medications for this visit.    PHYSICAL EXAMINATION: ECOG PERFORMANCE STATUS: 1 - Symptomatic but completely ambulatory  Vitals:   12/15/20 0839  BP: 102/71  Pulse: 72  Resp: 18  Temp: 97.8 F (36.6 C)  SpO2: 100%   Filed Weights   12/15/20 0839  Weight: 128 lb 11.2 oz (58.4 kg)      LABORATORY DATA:  I have reviewed the data as listed CMP Latest Ref Rng & Units 11/29/2020 10/13/2020 03/15/2020  Glucose 70 - 99 mg/dL 78 93 92  BUN 8 - 23 mg/dL 26(H) 26 16  Creatinine 0.44 - 1.00 mg/dL  2.30(H) 2.36(H) 1.65(H)  Sodium 135 - 145 mmol/L 141 140 142  Potassium 3.5 - 5.1 mmol/L 4.7 4.8 4.6  Chloride 98 - 111 mmol/L 109 101 103  CO2 22 - 32 mmol/L 24 25 21   Calcium 8.9 - 10.3 mg/dL 9.7 10.7(H) 10.7(H)  Total Protein 6.5 - 8.1 g/dL 8.2(H) 8.6(H) 8.3  Total Bilirubin 0.3 - 1.2 mg/dL 0.6 0.8 0.5  Alkaline Phos 38 - 126 U/L 113 118 111  AST 15 - 41 U/L 24 14 12   ALT 0 - 44 U/L 27 11 13     Lab Results  Component Value Date   WBC 6.6 11/29/2020   HGB 12.0 11/29/2020   HCT 37.0 11/29/2020   MCV 94.9 11/29/2020   PLT 263  11/29/2020   NEUTROABS 3.3 11/29/2020    ASSESSMENT & PLAN:  Malignant neoplasm of upper-outer quadrant of right breast in female, estrogen receptor positive ([place]) Lab review: 11/14/2020: M spike: 1.9 g, IgG 2884, immunofixation shows IgG kappa Creatinine: 2.38 (baseline creatinine 1.6), calcium 9.7, albumin 4.5, hemoglobin 13.1 11/29/2020: M spike 1.6 g IgG kappa,Kappa lambda ratio: 9 (Kappa 136.9, lambda 15.2),beta-2 microglobulin: 3.4, creatinine 2.3, hemoglobin 12 12/09/2020: Bone survey: No lytic lesions   Differential diagnosis: MGUS versus myeloma There is no evidence of severe anemia or hypercalcemia or lytic lesions.  Therefore it is most likely MGUS Acute renal failure could be related to underlying cardiac issues.     History of breast cancer: Currently on anastrozole therapy and doing well.   Return to clinic in 3 months with labs done ahead of time and follow-up 1 week later with a telephone visit    No orders of the defined types were placed in this encounter.  The patient has a good understanding of the overall plan. she agrees with it. she will call with any problems that may develop before the next visit here.  Total time spent: 20 mins including face to face time and time spent for planning, charting and coordination of care  Rulon Eisenmenger, MD, MPH 12/15/2020  I, Thana Ates, am acting as scribe for Dr. Nicholas Lose.  I  have reviewed the above documentation for accuracy and completeness, and I agree with the above.

## 2020-12-14 NOTE — Telephone Encounter (Signed)
PC placed to [facility].  I spoke with the pharmacist who has been working on this prescription for for hydrocodone for the patient.  She tells me that because it is being prescribed to treat acute pain taking only dispensed 5 days worth initially.  After 5 days I can send in another prescription if pt still needs it.  So even though I sent in rxn for 15 tabs for pt to take 1/2 tab BID, they can only dispense 5 tablets.  I inquired whether after the 5 days she can return and get the additional 10 tablets from of the same prescription.  She told me no.  A new prescription would need to be sent.

## 2020-12-15 ENCOUNTER — Inpatient Hospital Stay: Payer: Medicare Other | Attending: Hematology and Oncology | Admitting: Hematology and Oncology

## 2020-12-15 ENCOUNTER — Other Ambulatory Visit: Payer: Self-pay

## 2020-12-15 ENCOUNTER — Telehealth (INDEPENDENT_AMBULATORY_CARE_PROVIDER_SITE_OTHER): Payer: Self-pay

## 2020-12-15 DIAGNOSIS — Z79811 Long term (current) use of aromatase inhibitors: Secondary | ICD-10-CM | POA: Insufficient documentation

## 2020-12-15 DIAGNOSIS — Z170 Estrogen receptor positive status [ER+]: Secondary | ICD-10-CM | POA: Diagnosis not present

## 2020-12-15 DIAGNOSIS — C50411 Malignant neoplasm of upper-outer quadrant of right female breast: Secondary | ICD-10-CM | POA: Diagnosis not present

## 2020-12-15 DIAGNOSIS — D472 Monoclonal gammopathy: Secondary | ICD-10-CM | POA: Diagnosis not present

## 2020-12-15 NOTE — Telephone Encounter (Signed)
Copied from [place] [id number]. Topic: General - Other >> Dec 13, 2020 10:39 AM Holley Dexter N wrote: Reason for CRM: Pts niece called in stating she was trying to add PCP Raul Del in her insurance card, but when they spoke to [facility], they told her that she was not in the network, but pt has already been having her, and they wanted to tell someone that maybe the PCP may ave to reach out to them to get that updated, the insurance ph is [phone].   Please advise.

## 2020-12-15 NOTE — Assessment & Plan Note (Addendum)
Lab review: 11/14/2020: M spike: 1.9 g, IgG 2884, immunofixation shows IgG kappa Creatinine: 2.38 (baseline creatinine 1.6), calcium 9.7, albumin 4.5, hemoglobin 13.1 11/29/2020: M spike 1.6 g IgG kappa,Kappa lambda ratio: 9 (Kappa 136.9, lambda 15.2),beta-2 microglobulin: 3.4, creatinine 2.3, hemoglobin 12 12/09/2020: Bone survey: No lytic lesions  Differential diagnosis: MGUS versus myeloma There is no evidence of severe anemia or hypercalcemia or lytic lesions.  Therefore it is most likely MGUS Acute renal failure could be related to underlying cardiac issues.   History of breast cancer: Currently on anastrozole therapy and doing well.  Return to clinic in 3 months with labs done ahead of time and follow-up

## 2020-12-15 NOTE — Telephone Encounter (Signed)
Is this something you can help with?

## 2020-12-19 IMAGING — DX DG CHEST 1V
1 series · 1 of 1 positions shown · non-contrast
Comparison: Chest x-ray dated 03/22/2018.

CLINICAL DATA: Port-A-Cath placement.

EXAM:
CHEST  1 VIEW

[chest pa]
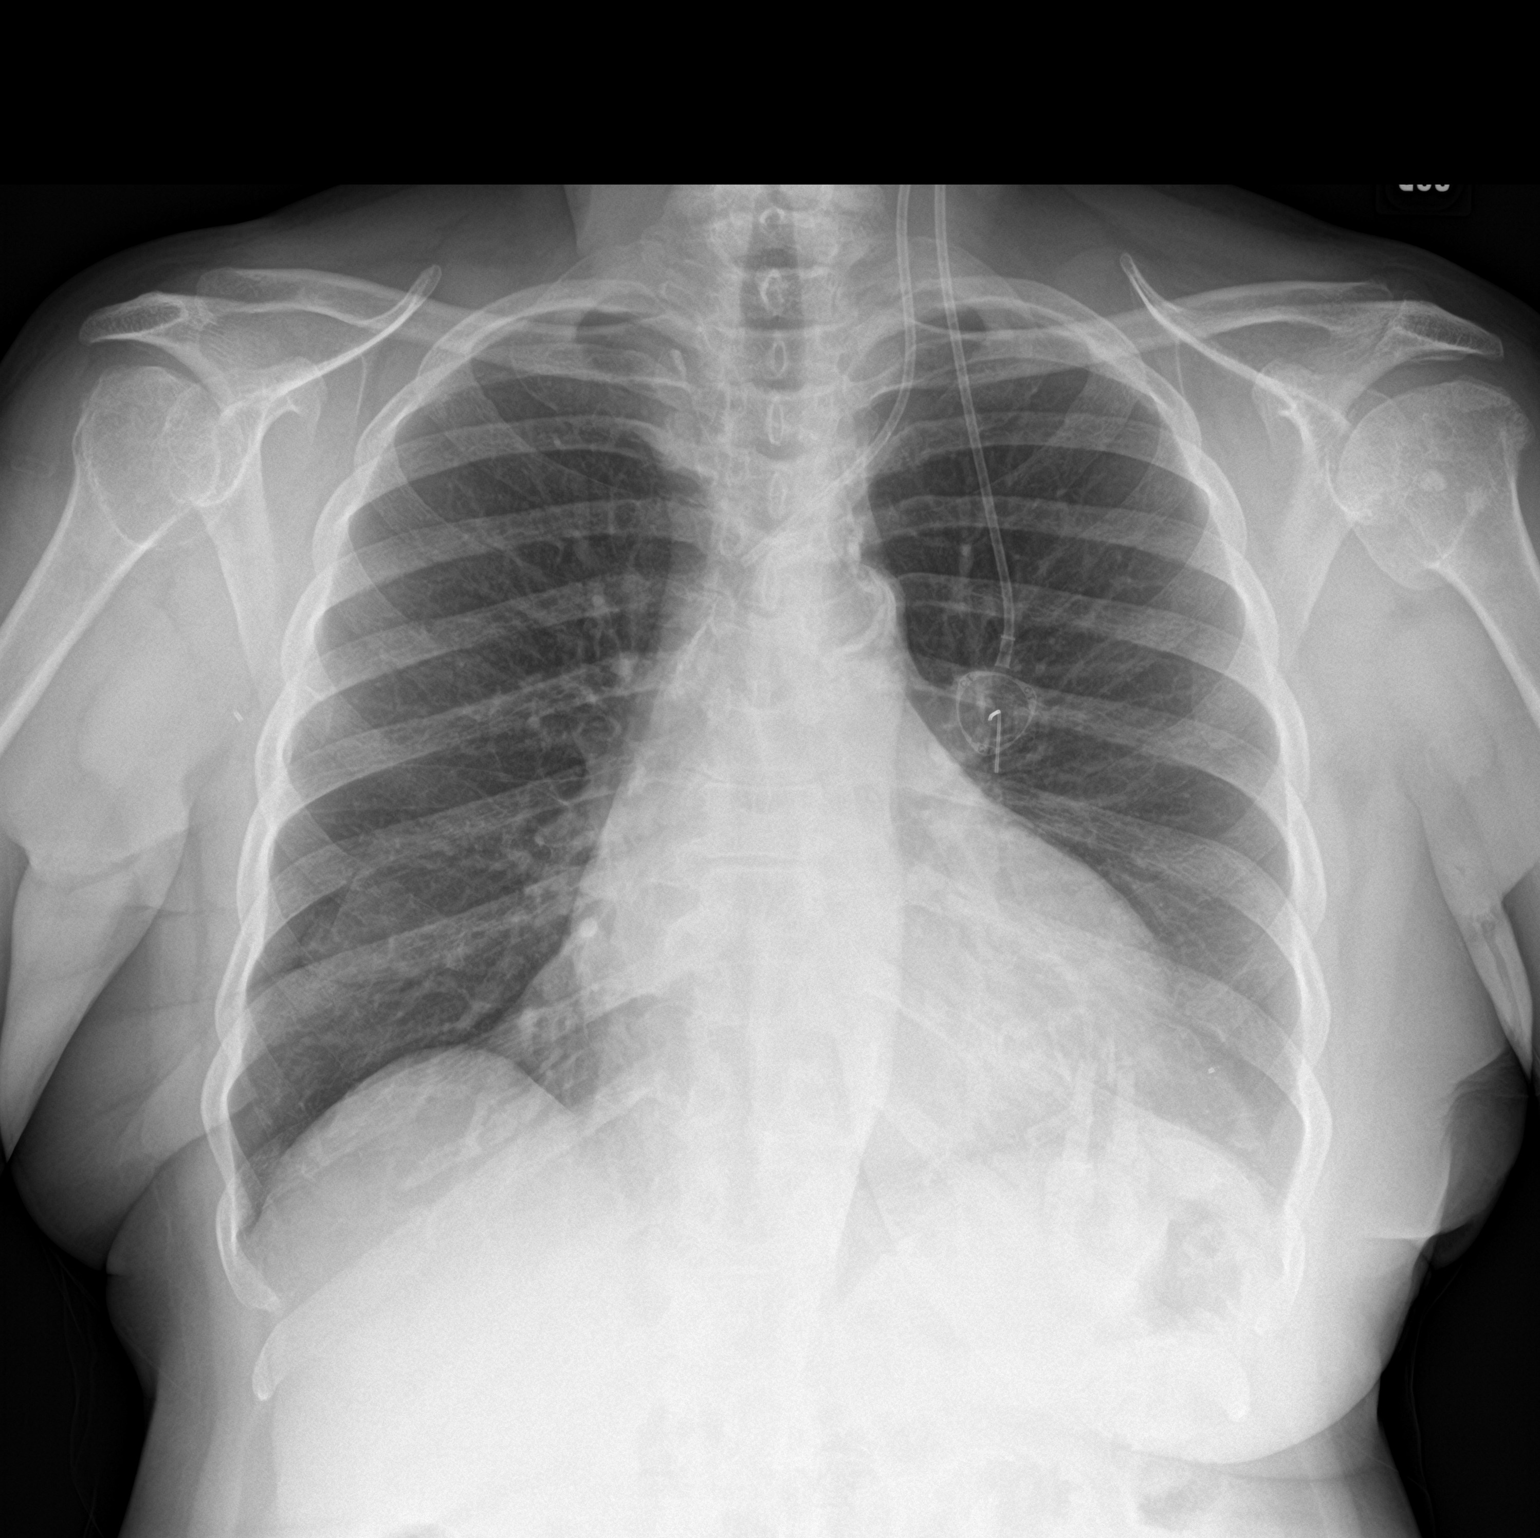

[1 of 1 positions shown; findings below may reference images not displayed]

FINDINGS: LEFT chest wall Port-A-Cath in place with tip positioned at the
level of the upper SVC/brachiocephalic junction. No pleural effusion
or pneumothorax seen. Heart size and mediastinal contours are
stable. No acute appearing osseous abnormality.
IMPRESSION: LEFT chest wall Port-A-Cath in place with tip positioned at the
level of the upper SVC/brachiocephalic junction. No pneumothorax
seen.

## 2020-12-26 ENCOUNTER — Other Ambulatory Visit: Payer: Self-pay | Admitting: Family Medicine

## 2020-12-26 ENCOUNTER — Other Ambulatory Visit: Payer: Self-pay

## 2020-12-26 ENCOUNTER — Encounter: Payer: Self-pay | Admitting: Nurse Practitioner

## 2020-12-26 ENCOUNTER — Ambulatory Visit: Payer: Medicare Other | Attending: Nurse Practitioner | Admitting: Nurse Practitioner

## 2020-12-26 VITALS — BP 86/57 | HR 66 | Ht 59.0 in | Wt 131.2 lb

## 2020-12-26 DIAGNOSIS — I10 Essential (primary) hypertension: Secondary | ICD-10-CM

## 2020-12-26 DIAGNOSIS — Z23 Encounter for immunization: Secondary | ICD-10-CM | POA: Diagnosis not present

## 2020-12-26 MED ORDER — AMLODIPINE BESYLATE 5 MG PO TABS: 5.0000 mg | ORAL_TABLET | Freq: Every day | ORAL | 1 refills | Status: DC

## 2020-12-26 MED ORDER — ZOSTER VAC RECOMB ADJUVANTED 50 MCG/0.5ML IM SUSR: 0.5000 mL | Freq: Once | INTRAMUSCULAR | 0 refills | Status: AC

## 2020-12-26 NOTE — Telephone Encounter (Signed)
too soon last RF 10/07/20 #90

## 2020-12-26 NOTE — Addendum Note (Signed)
Addended by: Barkley Bruns on: 12/26/2020 12:02 PM   Modules accepted: Orders

## 2020-12-26 NOTE — Progress Notes (Signed)
Assessment & Plan:  [name] was seen today for hypertension.  Diagnoses and all orders for this visit:  Essential hypertension -     amLODipine (NORVASC) 5 MG tablet; Take 1 tablet (5 mg total) by mouth daily.  Need for shingles vaccine -     Zoster Vaccine Adjuvanted [facility]) injection; Inject 0.5 mLs into the muscle once for 1 dose.   Patient has been counseled on age-appropriate routine health concerns for screening and prevention. These are reviewed and up-to-date. Referrals have been placed accordingly. Immunizations are up-to-date or declined.    Subjective:   Chief Complaint  Patient presents with   Hypertension   Hypertension Pertinent negatives include no blurred vision, chest pain, headaches, malaise/fatigue, palpitations or shortness of breath.  [name] 70 y.o. female presents to office today for follow up to HTN. She is currently being followed by [facility] for chronic kidney disease.  Last appointment was in October 2022  HTN Blood pressure is low today however she is asymptomatic. After discussion with her Cardiologist Dr. Terrence Dupont. I will decrease her amlodipine to 5 mg. She will follow up with me in 1 week to go over her blood pressure readings. If still low we will decrease amlodipine to 2.5 mg. Her amlodipine was increased due to elevated blood pressure at her recent Cardiology visit. Readings at that time:  170/72 165/70 She currently denies chest pain or shortness of breath.  BP Readings from Last 3 Encounters:  12/26/20 (!) 86/57  12/15/20 102/71  12/12/20 128/86     Lab Results  Component Value Date   HGBA1C 5.9 (H) 10/13/2020    Review of Systems  Constitutional:  Negative for fever, malaise/fatigue and weight loss.  HENT: Negative.  Negative for nosebleeds.   Eyes: Negative.  Negative for blurred vision, double vision and photophobia.  Respiratory: Negative.  Negative for cough and shortness of breath.   Cardiovascular: Negative.   Negative for chest pain, palpitations and leg swelling.  Gastrointestinal: Negative.  Negative for heartburn, nausea and vomiting.  Musculoskeletal: Negative.  Negative for myalgias.  Neurological: Negative.  Negative for dizziness, focal weakness, seizures and headaches.  Psychiatric/Behavioral: Negative.  Negative for suicidal ideas.    Past Medical History:  Diagnosis Date   Ambulates with cane    straight   Angina    no current problems per patient at PAT appt 11/05/18, nitroglycerin sl 06/30/20   Arthritis    HANDS"   Asthma    Cancer ([place]) 09/04/2017   Right Breast   Carpal tunnel syndrome, bilateral 11/26/2016   Cholelithiasis    Cocaine abuse ([place]) 07/2012   per E.R. drug screen   COPD (chronic obstructive pulmonary disease) ([place])    per patient   Coronary artery disease    Diabetes mellitus without mention of complication    type 2   Dysphagia    esophageal dysmotility on 03/2011 esophagram    Dyspnea    with exertion   Fatty liver disease, nonalcoholic 4098   on Imaging.    GERD (gastroesophageal reflux disease)    Headache    Hearing loss    bilateral - no hearing aids   History of colonic polyps 2009   adenomatous 2009, HP in 2009, 2011, 2013.    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   Wears dentures  full upper and partial lower   Wears glasses     Past Surgical History:  Procedure Laterality Date   ABDOMINAL ANGIOGRAM  02/20/2011   Procedure: ABDOMINAL ANGIOGRAM;  Surgeon: Clent Demark, MD;  Location: [facility] CATH LAB;  Service: Cardiovascular;;   BREAST BIOPSY Left    CARPAL TUNNEL RELEASE Right 07/04/2017   Procedure: RIGHT CARPAL TUNNEL RELEASE;  Surgeon: Leanora Cover, MD;  Location: [place];  Service: Orthopedics;  Laterality: Right;   CARPAL TUNNEL RELEASE Left 09/12/2017   Procedure: LEFT CARPAL TUNNEL  RELEASE;  Surgeon: Leanora Cover, MD;  Location: [place];  Service: Orthopedics;  Laterality: Left;   CHOLECYSTECTOMY N/A 11/11/2018   Procedure: ATTEMPTED LAPAROSCOPIC CHOLECUSTECOMY,  OPEN CHOLECYSTECTOMY;  Surgeon: Coralie Keens, MD;  Location: [place];  Service: General;  Laterality: N/A;   COLONOSCOPY  11/30/2011   Procedure: COLONOSCOPY;  Surgeon: Lafayette Dragon, MD;  Location: [facility] ENDOSCOPY;  Service: Endoscopy;  Laterality: N/A;   CORONARY ANGIOPLASTY WITH STENT PLACEMENT  02/20/2011   ERCP N/A 10/03/2018   Procedure: ENDOSCOPIC RETROGRADE CHOLANGIOPANCREATOGRAPHY (ERCP);  Surgeon: Ladene Artist, MD;  Location: [facility];  Service: Endoscopy;  Laterality: N/A;   ESOPHAGOGASTRODUODENOSCOPY  11/30/2011   Procedure: ESOPHAGOGASTRODUODENOSCOPY (EGD);  Surgeon: Lafayette Dragon, MD;  Location: [facility];  Service: Endoscopy;  Laterality: N/A;   ESOPHAGOGASTRODUODENOSCOPY N/A 02/23/2015   Procedure: ESOPHAGOGASTRODUODENOSCOPY (EGD);  Surgeon: Gatha Mayer, MD;  Location[facility] ENDOSCOPY;  Service: Endoscopy;  Laterality: N/A;   EYE SURGERY Bilateral    cataracts removed   INTRAOPERATIVE CHOLANGIOGRAM N/A 11/11/2018   Procedure: Intraoperative Cholangiogram;  Surgeon: Coralie Keens, MD;  Location: [place];  Service: General;  Laterality: N/A;   IR REMOVAL TUN ACCESS W/ PORT W/O FL MOD SED  06/30/2020   LEFT HEART CATHETERIZATION WITH CORONARY ANGIOGRAM N/A 02/20/2011   Procedure: LEFT HEART CATHETERIZATION WITH CORONARY ANGIOGRAM;  Surgeon: Clent Demark, MD;  Location: [place] CATH LAB;  Service: Cardiovascular;  Laterality: N/A;   MASTECTOMY Right 11/05/2017   MASTECTOMY W/ SENTINEL NODE BIOPSY Right 11/27/2017   MASTECTOMY W/ SENTINEL NODE BIOPSY Right 11/27/2017   Procedure: RIGHT TOTAL MASTECTOMY WITH SENTINEL LYMPH NODE BIOPSY;  Surgeon: Excell Seltzer, MD;  Location: [place];  Service: General;  Laterality: Right;   PORT A CATH REVISION Left 05/07/2018   Procedure: PORT  A CATH REVISION;  Surgeon: Excell Seltzer, MD;  Location: [facility];  Service: General;  Laterality: Left;   PORTACATH PLACEMENT Left 11/27/2017   Procedure: INSERTION PORT-A-CATH;  Surgeon: Excell Seltzer, MD;  Location: [place];  Service: General;  Laterality: Left;   RIGHT COLECTOMY  02/2007   for post polypectomy colonic perforation   SPHINCTEROTOMY  10/03/2018   Procedure: SPHINCTEROTOMY;  Surgeon: Ladene Artist, MD;  Location[facility]OS[facility];  Service: Endoscopy;;   TUBAL LIGATION      Family History  Problem Relation Age of Onset   Heart disease Mother    Diabetes Mother    Cancer Father        unsure what kind   Diabetes Sister    Colon cancer Neg Hx    Breast cancer Neg Hx     Social History Reviewed with no changes to be made today.   Outpatient Medications Prior to Visit  Medication Sig Dispense Refill   acetaminophen (TYLENOL) 500 MG tablet Take 1 tablet (500 mg total) by mouth every 6 (six) hours as needed. 30 tablet 0   albuterol (PROVENTIL) (2.5 MG/3ML)  0.083% nebulizer solution Take 3 mLs (2.5 mg total) by nebulization every 6 (six) hours as needed for wheezing or shortness of breath. 75 mL 3   ALPRAZolam (XANAX) 0.25 MG tablet Take 0.25 mg by mouth at bedtime.     anastrozole (ARIMIDEX) 1 MG tablet Take 1 tablet (1 mg total) by mouth daily. 90 tablet 3   aspirin 81 MG EC tablet Take 1 tablet (81 mg total) by mouth daily. 30 tablet 0   Blood Pressure Monitor DEVI Please provide patient with insurance approved blood pressure monitor. I10.0 1 each 0   budesonide-formoterol (SYMBICORT) 160-4.5 MCG/ACT inhaler Inhale 1 puff into the lungs 2 (two) times daily as needed (respiratory issues.). 1 each 6   diclofenac Sodium (VOLTAREN) 1 % GEL Apply 4 g topically 4 (four) times daily. 100 g 1   HYDROcodone-acetaminophen (NORCO) 5-325 MG tablet Take 1 tablet by mouth daily as needed for moderate pain. Patient advised to Take 1/2 tab PO twice a day which is equivalent to 1  tablet daily PRN pain 15 tablet 0   Insulin Pen Needle (TRUEPLUS PEN NEEDLES) 31G X 8 MM MISC Inject into the skin once nightly.  E11.65 100 each 1   losartan (COZAAR) 25 MG tablet TAKE ONE TABLET BY MOUTH DAILY 90 tablet 0   metoprolol succinate (TOPROL-XL) 50 MG 24 hr tablet TAKE ONE TABLET BY MOUTH EVERY DAY *TAKE WITH OR IMMEDIATELY FOLLOWING A MEAL* 90 tablet 0   Misc. Devices MISC Please provide patient with nebulizer mask and tubing. ICD-10J44.9 1 each 0   Multiple Vitamin (MULTIVITAMIN WITH MINERALS) TABS tablet Take 1 tablet by mouth daily. Sentry     nitroGLYCERIN (NITROSTAT) 0.4 MG SL tablet Place 1 tablet (0.4 mg total) under the tongue every 5 (five) minutes as needed for chest pain. 10 tablet 0   Omega-3 Fatty Acids (FISH OIL) 1000 MG CAPS Take 1,000 mg by mouth daily.     predniSONE (DELTASONE) 5 MG tablet Take 1 tablet (5 mg total) by mouth daily with breakfast. 3 tablet 0   VICTOZA 18 MG/3ML SOPN INJECT 1.8 MG INTO THE SKIN DAILY 9 mL 2   amLODipine (NORVASC) 10 MG tablet Take 1 tablet (10 mg total) by mouth daily.     atorvastatin (LIPITOR) 20 MG tablet Take 1 tablet (20 mg total) by mouth daily at 6 PM. 90 tablet 1   LANTUS SOLOSTAR 100 UNIT/ML Solostar Pen Inject 5 Units into the skin at bedtime. E11.65 6 mL 1   pantoprazole (PROTONIX) 40 MG tablet Take 1 tablet (40 mg total) by mouth daily. 90 tablet 0   No facility-administered medications prior to visit.    No Known Allergies     Objective:    BP (!) 86/57   Pulse 66   Ht 4\' 11"  (1.499 m)   Wt 131 lb 4 oz (59.5 kg)   LMP  (LMP Unknown) Comment: tubal ligation  SpO2 100%   BMI 26.51 kg/m  Wt Readings from Last 3 Encounters:  12/26/20 131 lb 4 oz (59.5 kg)  12/15/20 128 lb 11.2 oz (58.4 kg)  12/12/20 129 lb 12.8 oz (58.9 kg)    Physical Exam Vitals and nursing note reviewed.  Constitutional:      Appearance: She is well-developed.  HENT:     Head: Normocephalic and atraumatic.  Cardiovascular:     Rate  and Rhythm: Normal rate and regular rhythm.     Heart sounds: Normal heart sounds. No murmur heard.  No friction rub. No gallop.  Pulmonary:     Effort: Pulmonary effort is normal. No tachypnea or respiratory distress.     Breath sounds: Normal breath sounds. No decreased breath sounds, wheezing, rhonchi or rales.  Chest:     Chest wall: No tenderness.  Abdominal:     General: Bowel sounds are normal.     Palpations: Abdomen is soft.  Musculoskeletal:        General: Normal range of motion.     Cervical back: Normal range of motion.  Skin:    General: Skin is warm and dry.  Neurological:     Mental Status: She is alert and oriented to person, place, and time.     Coordination: Coordination normal.     Comments: Uses rolling walker for assistance with mobility  Psychiatric:        Behavior: Behavior normal. Behavior is cooperative.        Thought Content: Thought content normal.        Judgment: Judgment normal.         Patient has been counseled extensively about nutrition and exercise as well as the importance of adherence with medications and regular follow-up. The patient was given clear instructions to go to ER or return to medical center if symptoms don't improve, worsen or new problems develop. The patient verbalized understanding.   Follow-up: Return for TELE next Tuesday for BP check. See me in 3 months HTN/DM.   Gildardo Pounds, FNP-BC [facility] and [facility], [place]   12/26/2020, 11:53 AM

## 2020-12-27 DIAGNOSIS — I10 Essential (primary) hypertension: Secondary | ICD-10-CM | POA: Diagnosis not present

## 2020-12-27 NOTE — Telephone Encounter (Signed)
Requested medication (s) are due for refill today: yes  Requested medication (s) are on the active medication list: yes  Last refill:  10/07/20 #90 0 refills  Future visit scheduled: yes last seen yesterday . Future visit 1 week  Notes to clinic:  last B/P 86/57 on 12/26/20. Do you want to refill Rx?     Requested Prescriptions  Pending Prescriptions Disp Refills   losartan (COZAAR) 25 MG tablet [Pharmacy Med Name: losartan 25 mg tablet] 90 tablet 0    Sig: TAKE ONE TABLET BY MOUTH DAILY     Cardiovascular:  Angiotensin Receptor Blockers Failed - 12/26/2020  5:19 PM      Failed - Cr in normal range and within 180 days    Creatinine  Date Value Ref Range Status  11/29/2020 2.30 (H) 0.44 - 1.00 mg/dL Final          Failed - Last BP in normal range    BP Readings from Last 1 Encounters:  12/26/20 (!) 86/57          Passed - K in normal range and within 180 days    Potassium  Date Value Ref Range Status  11/29/2020 4.7 3.5 - 5.1 mmol/L Final          Passed - Patient is not pregnant      Passed - Valid encounter within last 6 months    Recent Outpatient Visits           Yesterday Essential hypertension   [place], Vernia Buff, NP   2 weeks ago Cervical radiculopathy   [place], Deborah B, MD   1 month ago Stage 3b chronic kidney disease [facility])   [place], [place] W, NP   4 months ago Stage 3b chronic kidney disease [facility])   [place], [place] W, NP   9 months ago Type 2 diabetes mellitus with stage 3b chronic kidney disease, with long-term current use of insulin [facility])   [place], Vernia Buff, NP       Future Appointments             In 1 week Gildardo Pounds, NP [place]   In 3 months Gildardo Pounds, NP [place]

## 2021-01-02 DIAGNOSIS — M25812 Other specified joint disorders, left shoulder: Secondary | ICD-10-CM | POA: Diagnosis not present

## 2021-01-02 DIAGNOSIS — M25512 Pain in left shoulder: Secondary | ICD-10-CM | POA: Diagnosis not present

## 2021-01-03 ENCOUNTER — Ambulatory Visit: Payer: Medicare Other | Attending: Nurse Practitioner | Admitting: Nurse Practitioner

## 2021-01-03 ENCOUNTER — Other Ambulatory Visit: Payer: Self-pay

## 2021-01-03 VITALS — BP 102/81

## 2021-01-03 DIAGNOSIS — I10 Essential (primary) hypertension: Secondary | ICD-10-CM | POA: Diagnosis not present

## 2021-01-03 DIAGNOSIS — M12812 Other specific arthropathies, not elsewhere classified, left shoulder: Secondary | ICD-10-CM

## 2021-01-03 MED ORDER — ACETAMINOPHEN-CODEINE #3 300-30 MG PO TABS: 1.0000 | ORAL_TABLET | Freq: Three times a day (TID) | ORAL | 0 refills | Status: DC | PRN

## 2021-01-03 NOTE — Progress Notes (Signed)
Virtual Visit via Telephone Note Due to national recommendations of social distancing due to [place] 19, telehealth visit is felt to be most appropriate for this patient at this time.  I discussed the limitations, risks, security and privacy concerns of performing an evaluation and management service by telephone and the availability of in person appointments. I also discussed with the patient that there may be a patient responsible charge related to this service. The patient expressed understanding and agreed to proceed.    I connected with [name] on 01/04/21  at   9:30 AM EST  EDT by telephone and verified that I am speaking with the correct person using two identifiers.  Location of Patient: Private Residence   Location of Provider: [place] and [employer] Office    Persons participating in Telemedicine visit: [name] FNP-BC TASCHA CASARES    History of Present Illness: Telemedicine visit for: Hypertension  HTN Blood pressure was checked during today's telemetry visit with reading today 102/81.  Last night's blood pressure was 100/76 and yesterday's blood pressure was 141/100.  She is still experiencing labile blood pressures.  I decreased her amlodipine from 10 mg to 5 mg last week and at this time we will continue this current dose.  She will continue on Toprol-XL 50 mg daily and losartan 25 mg daily.  BP Readings from Last 3 Encounters:  01/03/21 102/81  12/26/20 (!) 86/57  12/15/20 102/71    She is very tearful today while monitoring her blood pressure and states no one is giving her any pain medicine for the pain she is experiencing in her left shoulder due to rotator cuff arthropathy.  She is currently being followed by Dr. Onnie Graham Orthopedist for this.  Past Medical History:  Diagnosis Date   Ambulates with cane    straight   Angina    no current problems per patient at PAT appt 11/05/18, nitroglycerin sl 06/30/20   Arthritis    HANDS"   Asthma     Cancer ([place]) 09/04/2017   Right Breast   Carpal tunnel syndrome, bilateral 11/26/2016   Cholelithiasis    Cocaine abuse ([place]) 07/2012   per E.R. drug screen   COPD (chronic obstructive pulmonary disease) ([place])    per patient   Coronary artery disease    Diabetes mellitus without mention of complication    type 2   Dysphagia    esophageal dysmotility on 03/2011 esophagram    Dyspnea    with exertion   Fatty liver disease, nonalcoholic 9622   on Imaging.    GERD (gastroesophageal reflux disease)    Headache    Hearing loss    bilateral - no hearing aids   History of colonic polyps 2009   adenomatous 2009, HP in 2009, 2011, 2013.    Hyperlipidemia    Hypertension    Iron deficiency anemia    Myocardial infarction [facility])    years ago, 6 stents placed   Ulnar neuropathy at elbow, 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   Wears dentures    full upper and partial lower   Wears glasses     Past Surgical History:  Procedure Laterality Date   ABDOMINAL ANGIOGRAM  02/20/2011   Procedure: ABDOMINAL ANGIOGRAM;  Surgeon: Clent Demark, MD;  Location: [facility] CATH LAB;  Service: Cardiovascular;;   BREAST BIOPSY Left    CARPAL TUNNEL RELEASE Right 07/04/2017   Procedure: RIGHT CARPAL TUNNEL RELEASE;  Surgeon: Leanora Cover, MD;  Location: [place];  Service: Orthopedics;  Laterality: Right;   CARPAL TUNNEL RELEASE Left 09/12/2017   Procedure: LEFT CARPAL TUNNEL RELEASE;  Surgeon: Leanora Cover, MD;  Location: [place];  Service: Orthopedics;  Laterality: Left;   CHOLECYSTECTOMY N/A 11/11/2018   Procedure: ATTEMPTED LAPAROSCOPIC CHOLECUSTECOMY,  OPEN CHOLECYSTECTOMY;  Surgeon: Coralie Keens, MD;  Location: [place];  Service: General;  Laterality: N/A;   COLONOSCOPY  11/30/2011   Procedure: COLONOSCOPY;  Surgeon: Lafayette Dragon, MD;  [facility]: [facility] ENDOSCOPY;  Service: Endoscopy;  Laterality: N/A;   CORONARY ANGIOPLASTY WITH STENT PLACEMENT  02/20/2011   ERCP N/A 10/03/2018   Procedure:  ENDOSCOPIC RETROGRADE CHOLANGIOPANCREATOGRAPHY (ERCP);  Surgeon: Ladene Artist, MD;  [facility];  Service: Endoscopy;  Laterality: N/A;   ESOPHAGOGASTRODUODENOSCOPY  11/30/2011   Procedure: ESOPHAGOGASTRODUODENOSCOPY (EGD);  Surgeon: Lafayette Dragon, MD;  Location: [facility];  Service: Endoscopy;  Laterality: N/A;   ESOPHAGOGASTRODUODENOSCOPY N/A 02/23/2015   Procedure: ESOPHAGOGASTRODUODENOSCOPY (EGD);  Surgeon: Gatha Mayer, MD;  [facility] ENDOSCOPY;  Service: Endoscopy;  Laterality: N/A;   EYE SURGERY Bilateral    cataracts removed   INTRAOPERATIVE CHOLANGIOGRAM N/A 11/11/2018   Procedure: Intraoperative Cholangiogram;  Surgeon: Coralie Keens, MD;  Location: [place];  Service: General;  Laterality: N/A;   IR REMOVAL TUN ACCESS W/ PORT W/O FL MOD SED  06/30/2020   LEFT HEART CATHETERIZATION WITH CORONARY ANGIOGRAM N/A 02/20/2011   Procedure: LEFT HEART CATHETERIZATION WITH CORONARY ANGIOGRAM;  Surgeon: Clent Demark, MD;  Location: [place] CATH LAB;  Service: Cardiovascular;  Laterality: N/A;   MASTECTOMY Right 11/05/2017   MASTECTOMY W/ SENTINEL NODE BIOPSY Right 11/27/2017   MASTECTOMY W/ SENTINEL NODE BIOPSY Right 11/27/2017   Procedure: RIGHT TOTAL MASTECTOMY WITH SENTINEL LYMPH NODE BIOPSY;  Surgeon: Excell Seltzer, MD;  Location: [place];  Service: General;  Laterality: Right;   PORT A CATH REVISION Left 05/07/2018   Procedure: PORT A CATH REVISION;  Surgeon: Excell Seltzer, MD;  [facility];  Service: General;  Laterality: Left;   PORTACATH PLACEMENT Left 11/27/2017   Procedure: INSERTION PORT-A-CATH;  Surgeon: Excell Seltzer, MD;  Location: [place];  Service: General;  Laterality: Left;   RIGHT COLECTOMY  02/2007   for post polypectomy colonic perforation   SPHINCTEROTOMY  10/03/2018   Procedure: SPHINCTEROTOMY;  Surgeon: Ladene Artist, MD;  [facility];  Service: Endoscopy;;   TUBAL LIGATION      Family History  Problem Relation Age of  Onset   Heart disease Mother    Diabetes Mother    Cancer Father        unsure what kind   Diabetes Sister    Colon cancer Neg Hx    Breast cancer Neg Hx     Social History   Socioeconomic History   Marital status: Divorced    Spouse name: Not on file   Number of children: 2   Years of education: Not on file   Highest education level: Not on file  Occupational History   Occupation: retired. prev worked in housekeeping w/ [employer]  Tobacco Use   Smoking status: Every Day    Packs/day: 0.50    Years: 47.00    Pack years: 23.50    Types: Cigarettes    Last attempt to quit: 05/09/2010    Years since quitting: 10.6   Smokeless tobacco: Never  Vaping Use   Vaping Use: Never used  Substance and Sexual Activity   Alcohol use: Yes   Drug use: Not Currently  Types: Cocaine    Comment: positive drug screen (cocaine, benzo's)07/2012 in emergency room   Sexual activity: Not Currently    Birth control/protection: Post-menopausal  Other Topics Concern   Not on file  Social History Narrative   Caffeine use daily.   Social Determinants of Health   Financial Resource Strain: Not on file  Food Insecurity: Not on file  Transportation Needs: Not on file  Physical Activity: Not on file  Stress: Not on file  Social Connections: Not on file     Observations/Objective: Awake, alert and oriented x 3   Review of Systems  Constitutional:  Negative for fever, malaise/fatigue and weight loss.  HENT: Negative.  Negative for nosebleeds.   Eyes: Negative.  Negative for blurred vision, double vision and photophobia.  Respiratory: Negative.  Negative for cough and shortness of breath.   Cardiovascular: Negative.  Negative for chest pain, palpitations and leg swelling.  Gastrointestinal: Negative.  Negative for heartburn, nausea and vomiting.  Musculoskeletal:  Positive for joint pain. Negative for myalgias.  Neurological: Negative.  Negative for dizziness, focal weakness, seizures and  headaches.  Psychiatric/Behavioral: Negative.  Negative for suicidal ideas.    Assessment and Plan: Diagnoses and all orders for this visit:  Essential hypertension Continue all antihypertensives as prescribed.  Remember to bring in your blood pressure log with you for your follow up appointment.  DASH/Mediterranean Diets are healthier choices for HTN.    Rotator cuff arthropathy of left shoulder acetaminophen-codeine (TYLENOL #3) 300-30 MG tablet; Take 1 tablet by mouth every 8 (eight) hours as needed for moderate pain.    Follow Up Instructions Return if symptoms worsen or fail to improve.     I discussed the assessment and treatment plan with the patient. The patient was provided an opportunity to ask questions and all were answered. The patient agreed with the plan and demonstrated an understanding of the instructions.   The patient was advised to call back or seek an in-person evaluation if the symptoms worsen or if the condition fails to improve as anticipated.  I provided 10 minutes of non-face-to-face time during this encounter including median intraservice time, reviewing previous notes, labs, imaging, medications and explaining diagnosis and management.  Gildardo Pounds, FNP-BC

## 2021-01-04 ENCOUNTER — Other Ambulatory Visit: Payer: Self-pay | Admitting: Nurse Practitioner

## 2021-01-04 ENCOUNTER — Encounter: Payer: Self-pay | Admitting: Nurse Practitioner

## 2021-01-04 ENCOUNTER — Telehealth: Payer: Self-pay | Admitting: *Deleted

## 2021-01-04 DIAGNOSIS — M12812 Other specific arthropathies, not elsewhere classified, left shoulder: Secondary | ICD-10-CM

## 2021-01-04 MED ORDER — ACETAMINOPHEN-CODEINE #3 300-30 MG PO TABS: 1.0000 | ORAL_TABLET | Freq: Three times a day (TID) | ORAL | 0 refills | Status: AC | PRN

## 2021-01-04 NOTE — Telephone Encounter (Signed)
Medication was sent to Pharmacy of choice.

## 2021-01-04 NOTE — Telephone Encounter (Signed)
Copied from [place] [id number]. Topic: General - Other >> Jan 03, 2021  1:17 PM Leward Quan A wrote: Reason for CRM: Patient called in and complained that Rx was sent to the pharmacy say that she needed it sent to the [facility] ([place]), [place] - [address]  Phone:  [phone] Fax:  [phone] Per patient she want this done today

## 2021-01-09 ENCOUNTER — Ambulatory Visit: Payer: Medicaid Other

## 2021-01-13 NOTE — Telephone Encounter (Signed)
Will forward to nurse 

## 2021-01-15 ENCOUNTER — Other Ambulatory Visit: Payer: Self-pay | Admitting: Nurse Practitioner

## 2021-01-15 DIAGNOSIS — I10 Essential (primary) hypertension: Secondary | ICD-10-CM

## 2021-01-15 IMAGING — DX PORTABLE CHEST - 1 VIEW
1 series · 1 of 1 positions shown · non-contrast
Comparison: 04/10/2018

CLINICAL DATA: Post Port-A-Cath insertion

EXAM:
PORTABLE CHEST 1 VIEW

[chest ap]
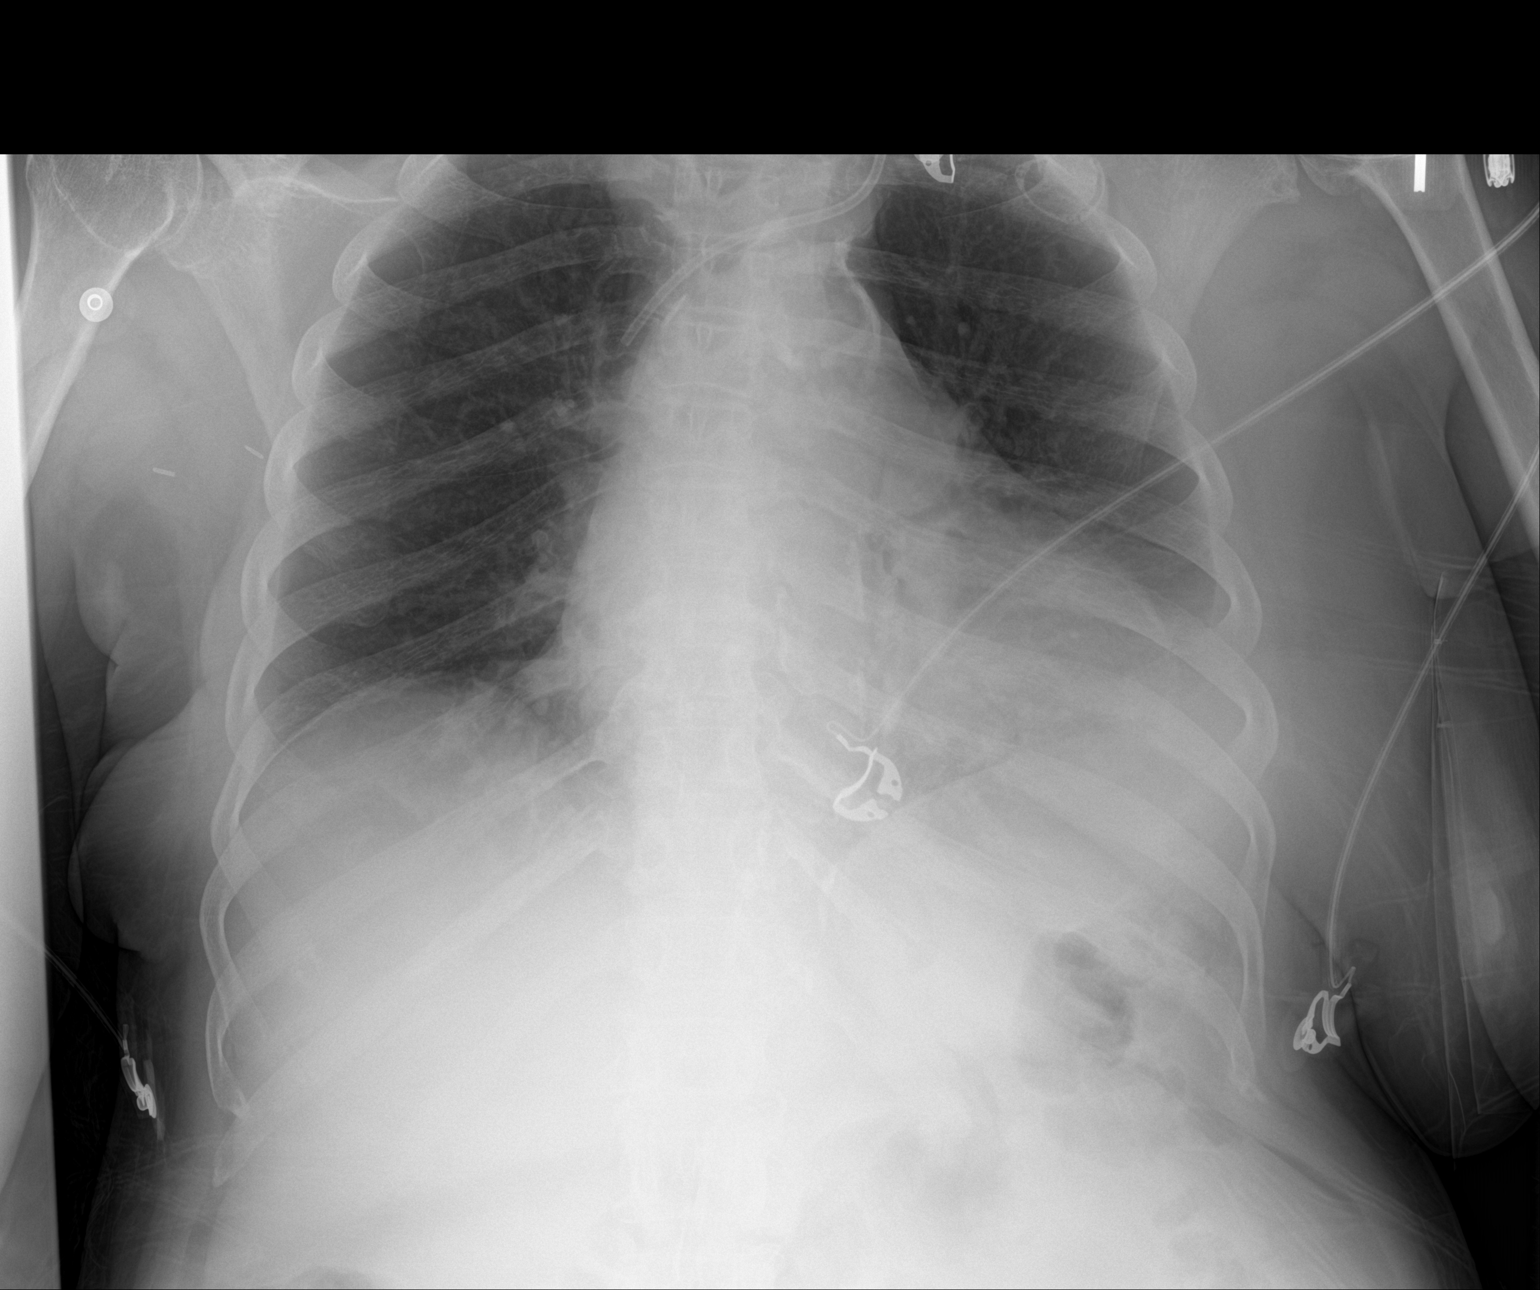

[1 of 1 positions shown; findings below may reference images not displayed]

FINDINGS: Left Port-A-Cath in place with the tip in the upper SVC. No
pneumothorax. Cardiomegaly. Small left pleural effusion with left
base atelectasis or infiltrate. Minimal right base opacity, likely
atelectasis.
IMPRESSION: Left Port-A-Cath in place with the tip in the upper SVC. No
pneumothorax.

Cardiomegaly.

Small left effusion with bibasilar atelectasis or infiltrates, left
greater than right.

## 2021-01-15 NOTE — Addendum Note (Signed)
Addended by: Carlisle Beers on: 01/15/2021 08:43 AM   Modules accepted: Orders

## 2021-01-15 NOTE — Telephone Encounter (Signed)
Requested Prescriptions  Pending Prescriptions Disp Refills  . metoprolol succinate (TOPROL-XL) 50 MG 24 hr tablet [Pharmacy Med Name: metoprolol succinate ER 50 mg tablet,extended release 24 hr] 90 tablet 0    Sig: TAKE ONE TABLET BY MOUTH EVERY DAY *TAKE WITH OR IMMEDIATELY FOLLOWING A MEAL*     Cardiovascular:  Beta Blockers Passed - 01/15/2021  8:01 AM      Passed - Last BP in normal range    BP Readings from Last 1 Encounters:  01/03/21 102/81         Passed - Last Heart Rate in normal range    Pulse Readings from Last 1 Encounters:  12/26/20 66         Passed - Valid encounter within last 6 months    Recent Outpatient Visits          1 week ago Essential hypertension   [place], Vernia Buff, NP   2 weeks ago Essential hypertension   [place], Vernia Buff, NP   1 month ago Cervical radiculopathy   [place], MD   2 months ago Stage 3b chronic kidney disease [facility])   [place] Gildardo Pounds, NP   5 months ago Stage 3b chronic kidney disease [facility])   [place] Gildardo Pounds, NP      Future Appointments            In 2 months Gildardo Pounds, NP [place]           . amLODipine (NORVASC) 5 MG tablet [Pharmacy Med Name: amlodipine 5 mg tablet] 90 tablet     Sig: TAKE ONE TABLET BY MOUTH DAILY     Cardiovascular:  Calcium Channel Blockers Passed - 01/15/2021  8:01 AM      Passed - Last BP in normal range    BP Readings from Last 1 Encounters:  01/03/21 102/81         Passed - Valid encounter within last 6 months    Recent Outpatient Visits          1 week ago Essential hypertension   [place], Vernia Buff, NP   2 weeks ago Essential hypertension   [place], Vernia Buff, NP   1 month ago Cervical radiculopathy   [place], Deborah B, MD   2 months ago Stage 3b chronic kidney disease [facility])   [place] Gildardo Pounds, NP   5 months ago Stage 3b chronic kidney disease [facility])   [place] Gildardo Pounds, NP      Future Appointments            In 2 months Gildardo Pounds, NP [place]

## 2021-01-15 NOTE — Addendum Note (Signed)
Addended by: Carlisle Beers on: 01/15/2021 07:32 PM   Modules accepted: Orders

## 2021-01-15 NOTE — Addendum Note (Signed)
Addended by: Carlisle Beers on: 01/15/2021 08:48 AM   Modules accepted: Orders

## 2021-01-15 NOTE — Telephone Encounter (Signed)
Amlodipine: last RF 12/26/20 #30 1 RF   too soon RX ends 01/25/21  Requested Prescriptions  Signed Prescriptions Disp Refills   metoprolol succinate (TOPROL-XL) 50 MG 24 hr tablet 90 tablet 0    Sig: TAKE ONE TABLET BY MOUTH EVERY DAY *TAKE WITH OR IMMEDIATELY FOLLOWING A MEAL*     Cardiovascular:  Beta Blockers Passed - 01/15/2021  8:01 AM      Passed - Last BP in normal range    BP Readings from Last 1 Encounters:  01/03/21 102/81          Passed - Last Heart Rate in normal range    Pulse Readings from Last 1 Encounters:  12/26/20 66          Passed - Valid encounter within last 6 months    Recent Outpatient Visits           1 week ago Essential hypertension   [place], Vernia Buff, NP   2 weeks ago Essential hypertension   [place], Vernia Buff, NP   1 month ago Cervical radiculopathy   [place] Ladell Pier, MD   2 months ago Stage 3b chronic kidney disease [facility])   [place], Vernia Buff, NP   5 months ago Stage 3b chronic kidney disease [facility]   [place], Vernia Buff, NP       Future Appointments             In 2 months Gildardo Pounds, NP [place]            Refused Prescriptions Disp Refills   amLODipine ([place]) 5 MG tablet [Pharmacy Med Name: amlodipine 5 mg tablet] 90 tablet     Sig: TAKE ONE TABLET BY MOUTH DAILY     Cardiovascular:  Calcium Channel Blockers Passed - 01/15/2021  8:43 AM      Passed - Last BP in normal range    BP Readings from Last 1 Encounters:  01/03/21 102/81          Passed - Valid encounter within last 6 months    Recent Outpatient Visits           1 week ago Essential hypertension   [place], Vernia Buff, NP   2 weeks ago Essential hypertension   [place], Vernia Buff, NP   1 month ago Cervical radiculopathy   [place] Ladell Pier, MD   2 months ago Stage 3b chronic kidney disease [facility])   [place], Vernia Buff, NP   5 months ago Stage 3b chronic kidney disease [facility])   [place], Vernia Buff, NP       Future Appointments             In 2 months Gildardo Pounds, NP [place]             amLODipine (NORVASC) 5 MG tablet 30 tablet 1    Sig: Take 1 tablet (5 mg total) by mouth daily.     Cardiovascular:  Calcium Channel Blockers Passed - 01/15/2021  8:43 AM      Passed - Last BP in normal range    BP  Readings from Last 1 Encounters:  01/03/21 102/81          Passed - Valid encounter within last 6 months    Recent Outpatient Visits           1 week ago Essential hypertension   [place], Vernia Buff, NP   2 weeks ago Essential hypertension   [place], Vernia Buff, NP   1 month ago Cervical radiculopathy   [place], Deborah B, MD   2 months ago Stage 3b chronic kidney disease [facility])   [place] Gildardo Pounds, NP   5 months ago Stage 3b chronic kidney disease [facility])   [place] Gildardo Pounds, NP       Future Appointments             In 2 months Gildardo Pounds, NP [place]

## 2021-01-23 ENCOUNTER — Other Ambulatory Visit: Payer: Self-pay | Admitting: Nurse Practitioner

## 2021-01-23 DIAGNOSIS — I10 Essential (primary) hypertension: Secondary | ICD-10-CM

## 2021-01-24 NOTE — Telephone Encounter (Signed)
Requested medication (s) are on the active medication list: yes  Future visit scheduled: 02/08/21  Notes to clinic:  Unsure if last approval was received by pharmacy. Pt is up to date on visits and has upcoming appt scheduled. Please assess.  Requested Prescriptions  Pending Prescriptions Disp Refills   amLODipine (NORVASC) 5 MG tablet [Pharmacy Med Name: amlodipine 5 mg tablet] 90 tablet 0    Sig: TAKE ONE TABLET BY MOUTH DAILY     Cardiovascular:  Calcium Channel Blockers Passed - 01/23/2021  4:03 PM      Passed - Last BP in normal range    BP Readings from Last 1 Encounters:  01/03/21 102/81          Passed - Valid encounter within last 6 months    Recent Outpatient Visits           3 weeks ago Essential hypertension   [place], Vernia Buff, NP   4 weeks ago Essential hypertension   [place], Vernia Buff, NP   1 month ago Cervical radiculopathy   [place], Deborah B, MD   2 months ago Stage 3b chronic kidney disease [facility])   [place] Gildardo Pounds, NP   5 months ago Stage 3b chronic kidney disease [facility])   [place] Gildardo Pounds, NP       Future Appointments             In 2 months Gildardo Pounds, NP [place]

## 2021-01-25 ENCOUNTER — Other Ambulatory Visit: Payer: Self-pay | Admitting: Nurse Practitioner

## 2021-01-25 DIAGNOSIS — J449 Chronic obstructive pulmonary disease, unspecified: Secondary | ICD-10-CM

## 2021-01-25 NOTE — Telephone Encounter (Signed)
[place] called and spoke to [place], [facility] about the refill(s) albuterol neb solution requested. Advised it was sent on 11/15/20 #75/3 refill(s). She states that this request can be disregarded and pt has refills left.  Requested Prescriptions  Pending Prescriptions Disp Refills   albuterol (PROVENTIL) (2.5 MG/3ML) 0.083% nebulizer solution [Pharmacy Med Name: albuterol sulfate 2.5 mg/3 mL (0.083 %) solution for nebulization] 75 mL 3    Sig: USE ONE VIAL IN NEBULIZER EVERY 6 HOURS AS NEEDED FOR WHEEZING OR SHORTNESS OF BREATH     Pulmonology:  Beta Agonists Failed - 01/25/2021  5:33 PM      Failed - One inhaler should last at least one month. If the patient is requesting refills earlier, contact the patient to check for uncontrolled symptoms.      Passed - Valid encounter within last 12 months    Recent Outpatient Visits           3 weeks ago Essential hypertension   [place], Vernia Buff, NP   1 month ago Essential hypertension   [place], Vernia Buff, NP   1 month ago Cervical radiculopathy   [place], Deborah B, MD   2 months ago Stage 3b chronic kidney disease [facility])   [place] Gildardo Pounds, NP   5 months ago Stage 3b chronic kidney disease [facility])   [place] Gildardo Pounds, NP       Future Appointments             In 2 months Gildardo Pounds, NP [place]

## 2021-01-31 ENCOUNTER — Other Ambulatory Visit: Payer: Self-pay

## 2021-01-31 DIAGNOSIS — I10 Essential (primary) hypertension: Secondary | ICD-10-CM

## 2021-01-31 MED ORDER — LOSARTAN POTASSIUM 25 MG PO TABS: 25.0000 mg | ORAL_TABLET | Freq: Every day | ORAL | 0 refills | Status: DC

## 2021-01-31 NOTE — Telephone Encounter (Signed)
Rx refilled.

## 2021-02-05 ENCOUNTER — Encounter: Payer: Self-pay | Admitting: Hematology and Oncology

## 2021-02-07 ENCOUNTER — Encounter (HOSPITAL_COMMUNITY): Payer: Self-pay

## 2021-02-07 NOTE — Patient Instructions (Signed)
DUE TO COVID-19 ONLY ONE VISITOR IS ALLOWED TO COME WITH YOU AND STAY IN THE WAITING ROOM ONLY DURING PRE OP AND PROCEDURE DAY OF SURGERY.   Up to two visitors ages 16+ are allowed at one time in a patient's room.  The visitors may rotate out with other people throughout the day.  Additionally, up to two children between the ages of 41 and 74 are allowed and do not count toward the number of allowed visitors.  Children within this age range must be accompanied by an adult visitor.  One adult visitor may remain with the patient overnight and must be in the room by 8 PM.  YOU NEED TO HAVE A COVID 19 TEST ON_1-10-23 @   ______ THIS TEST MUST BE DONE BEFORE SURGERY,     [name] COME IN THROUGH MAIN ENTRANCE BE SEATED INT THE LOBBY AREA TO THE RIGHT AS YOU COME IN THE MAIN ENTRANCE DIAL [phone] GIVE THEM YOUR NAME AND LET THEM KNOW YOU ARE HERE FOR COVID TESTING    ONCE YOUR COVID TEST IS COMPLETED,  PLEASE Wear a mask when in [place]           Your procedure is scheduled on: 02-16-21   Report to [facility] Main  Entrance   Report to admitting at     1215 PM     Call this number if you have problems the morning of surgery [phone]   Remember: NO SOLID FOOD AFTER MIDNIGHT THE NIGHT PRIOR TO SURGERY. NOTHING BY MOUTH EXCEPT CLEAR LIQUIDS UNTIL   1200 pm .   PLEASE FINISH  G2  DRINK PER SURGEON ORDER  WHICH NEEDS TO BE COMPLETED AT     1200 pm the day of your surgery then nothing by mouth.     CLEAR LIQUID DIET                                                                    water Black Coffee and tea, regular and decaf No Creamer                            Plain Jell-O any favor except red or purple                                  Fruit ices (not with fruit pulp)                                      Iced Popsicles                                     Carbonated beverages, regular and diet                                     Cranberry, grape and apple juices Sports drinks like  Gatorade Lightly seasoned clear broth or consume(fat free) Sugar, honey syrup    BRUSH YOUR TEETH MORNING OF SURGERY AND RINSE YOUR MOUTH OUT, NO CHEWING GUM CANDY OR MINTS.     Take these medicines the morning of surgery with A SIP OF WATER: prednisone, pantoprazole, metoprolol, inhalers and bring them with you, atorvastatin, arimidex, amlodipine  DO NOT TAKE ANY DIABETIC MEDICATIONS DAY OF YOUR SURGERY    How to Manage Your Diabetes Before and After Surgery  Why is it important to control my blood sugar before and after surgery? Improving blood sugar levels before and after surgery helps healing and can limit problems. A way of improving blood sugar control is eating a healthy diet by:  Eating less sugar and carbohydrates  Increasing activity/exercise  Talking with your doctor about reaching your blood sugar goals High blood sugars (greater than 180 mg/dL) can raise your risk of infections and slow your recovery, so you will need to focus on controlling your diabetes during the weeks before surgery. Make sure that the doctor who takes care of your diabetes knows about your planned surgery including the date and location.  How do I manage my blood sugar before surgery? Check your blood sugar at least 4 times a day, starting 2 days before surgery, to make sure that the level is not too high or low. Check your blood sugar the morning of your surgery when you wake up and every 2 hours until you get to the Short Stay unit. If your blood sugar is less than 70 mg/dL, you will need to treat for low blood sugar: Do not take insulin. Treat a low blood sugar (less than 70 mg/dL) with  cup of clear juice (cranberry or apple), 4 glucose tablets, OR glucose gel. Recheck blood sugar in 15 minutes after treatment (to make sure it is greater than 70 mg/dL). If your blood sugar is not greater than 70 mg/dL on recheck, call  [phone] for further instructions. Report your blood sugar to the short stay nurse when you get to Short Stay.  If you are admitted to the hospital after surgery: Your blood sugar will be checked by the staff and you will probably be given insulin after surgery (instead of oral diabetes medicines) to make sure you have good blood sugar levels. The goal for blood sugar control after surgery is 80-180 mg/dL.   WHAT DO I DO ABOUT MY DIABETES MEDICATION?  Do not take oral diabetes medicines (pills) the morning of surgery.  THE NIGHT BEFORE SURGERY, take  50% of your normal Lantus dose  of  insulin.       The day of surgery, do not take other diabetes injectables, including Byetta (exenatide), Bydureon (exenatide ER), Victoza (liraglutide), or Trulicity (dulaglutide).                   You may not have any metal on your body including hair pins and              piercings  Do not wear jewelry, make-up, lotions, powders,perfumes,        deodorant             Do not wear nail polish on your fingernails or toenails .  Do not shave  48 hours prior to surgery.            Do not bring valuables to the hospital. [place] IS NOT  RESPONSIBLE   FOR VALUABLES.  Contacts, dentures or bridgework may not be worn into surgery.  You may bring a small overnight bag with you     Patients discharged the day of surgery will not be allowed to drive home. IF YOU ARE HAVING SURGERY AND GOING HOME THE SAME DAY, YOU MUST HAVE AN ADULT TO DRIVE YOU HOME AND BE WITH YOU FOR 24 HOURS. YOU MAY GO HOME BY TAXI OR UBER OR ORTHERWISE, BUT AN ADULT MUST ACCOMPANY YOU HOME AND STAY WITH YOU FOR 24 HOURS.  Name and phone number of your driver:  Special Instructions: N/A              Please read over the following fact sheets you were given: _____________________________________________________________________ [facility]- Preparing for Total Shoulder Arthroplasty    Before surgery, you can play an  important role. Because skin is not sterile, your skin needs to be as free of germs as possible. You can reduce the number of germs on your skin by using the following products. Benzoyl Peroxide Gel Reduces the number of germs present on the skin Applied twice a day to shoulder area starting two days before surgery    ==================================================================  Please follow these instructions carefully:  BENZOYL PEROXIDE 5% GEL  Please do not use if you have an allergy to benzoyl peroxide.   If your skin becomes reddened/irritated stop using the benzoyl peroxide.  Starting two days before surgery, apply as follows: Apply benzoyl peroxide in the morning and at night. Apply after taking a shower. If you are not taking a shower clean entire shoulder front, back, and side along with the armpit with a clean wet washcloth.  Place a quarter-sized dollop on your shoulder and rub in thoroughly, making sure to cover the front, back, and side of your shoulder, along with the armpit.   2 days before ____ AM   ____ PM              1 day before ____ AM   ____ PM                         Do this twice a day for two days.  (Last application is the night before surgery, AFTER using the CHG soap as described below).  Do NOT apply benzoyl peroxide gel on the day of surgery.             [place] - Preparing for Surgery Before surgery, you can play an important role.  Because skin is not sterile, your skin needs to be as free of germs as possible.  You can reduce the number of germs on your skin by washing with CHG (chlorahexidine gluconate) soap before surgery.  CHG is an antiseptic cleaner which kills germs and bonds with the skin to continue killing germs even after washing. Please DO NOT use if you have an allergy to CHG or antibacterial soaps.  If your skin becomes reddened/irritated stop using the CHG and inform your nurse when you arrive at Short Stay. Do not shave (including  legs and underarms) for at least 48 hours prior to the first CHG shower.  You may shave your face/neck. Please follow these instructions carefully:  1.  Shower with CHG Soap the night before surgery and the  morning of Surgery.  2.  If you choose to wash your hair, wash your hair first as usual with your  normal  shampoo.  3.  After you shampoo, rinse your hair and body thoroughly to remove the  shampoo.                           4.  Use CHG as you would any other liquid soap.  You can apply chg directly  to the skin and wash                       Gently with a scrungie or clean washcloth.  5.  Apply the CHG Soap to your body ONLY FROM THE NECK DOWN.   Do not use on face/ open                           Wound or open sores. Avoid contact with eyes, ears mouth and genitals (private parts).                       Wash face,  Genitals (private parts) with your normal soap.             6.  Wash thoroughly, paying special attention to the area where your surgery  will be performed.  7.  Thoroughly rinse your body with warm water from the neck down.  8.  DO NOT shower/wash with your normal soap after using and rinsing off  the CHG Soap.                9.  Pat yourself dry with a clean towel.            10.  Wear clean pajamas.            11.  Place clean sheets on your bed the night of your first shower and do not  sleep with pets. Day of Surgery : Do not apply any lotions/deodorants the morning of surgery.  Please wear clean clothes to the hospital/surgery center.  FAILURE TO FOLLOW THESE INSTRUCTIONS MAY RESULT IN THE CANCELLATION OF YOUR SURGERY PATIENT SIGNATURE_________________________________  NURSE SIGNATURE__________________________________  ________________________________________________________________________    [name]  An incentive spirometer is a tool that can help keep your lungs clear and active. This tool measures how well you are filling your lungs with each breath.  Taking long deep breaths may help reverse or decrease the chance of developing breathing (pulmonary) problems (especially infection) following: A long period of time when you are unable to move or be active. BEFORE THE PROCEDURE  If the spirometer includes an indicator to show your best effort, your nurse or respiratory therapist will set it to a desired goal. If possible, sit up straight or lean slightly forward. Try not to slouch. Hold the incentive spirometer in an upright position. INSTRUCTIONS FOR USE  Sit on the edge of your bed if possible, or sit up as far as you can in bed or on a chair. Hold the incentive spirometer in an upright position. Breathe out normally. Place the mouthpiece in your mouth and seal your lips tightly around it. Breathe in slowly and as deeply as possible, raising the piston or the ball toward the top of the column. Hold your breath for 3-5 seconds or for as long as possible. Allow the piston or ball to fall to the bottom of the column. Remove the mouthpiece from your mouth and breathe out normally. Rest for a few seconds and repeat Steps 1 through 7 at least 10  times every 1-2 hours when you are awake. Take your time and take a few normal breaths between deep breaths. The spirometer may include an indicator to show your best effort. Use the indicator as a goal to work toward during each repetition. After each set of 10 deep breaths, practice coughing to be sure your lungs are clear. If you have an incision (the cut made at the time of surgery), support your incision when coughing by placing a pillow or rolled up towels firmly against it. Once you are able to get out of bed, walk around indoors and cough well. You may stop using the incentive spirometer when instructed by your caregiver.  RISKS AND COMPLICATIONS Take your time so you do not get dizzy or light-headed. If you are in pain, you may need to take or ask for pain medication before doing incentive  spirometry. It is harder to take a deep breath if you are having pain. AFTER USE Rest and breathe slowly and easily. It can be helpful to keep track of a log of your progress. Your caregiver can provide you with a simple table to help with this. If you are using the spirometer at home, follow these instructions: [name] IF:  You are having difficultly using the spirometer. You have trouble using the spirometer as often as instructed. Your pain medication is not giving enough relief while using the spirometer. You develop fever of 100.5 F (38.1 C) or higher. SEEK IMMEDIATE MEDICAL CARE IF:  You cough up bloody sputum that had not been present before. You develop fever of 102 F (38.9 C) or greater. You develop worsening pain at or near the incision site. MAKE SURE YOU:  Understand these instructions. Will watch your condition. Will get help right away if you are not doing well or get worse. Document Released: 06/04/2006 Document Revised: 04/16/2011 Document Reviewed: 08/05/2006 [facility] Patient Information 2014 [place], [place].   ________________________________________________________________________

## 2021-02-07 NOTE — Progress Notes (Addendum)
PCP - [name], NP [facility] 12-26-20 epic Clearance 01-03-21 on chart Cardiologist -  Nephrology-Samuel Peoples 11-14-20 clearance on chart  PPM/ICD -  Device Orders -  Rep Notified -   Chest x-ray -  EKG -  Stress Test - 2020 epic ECHO - 2021 epic Cardiac Cath -   Sleep Study -  CPAP -   Fasting Blood Sugar -  Checks Blood Sugar _____ times a day  Blood Thinner Instructions: Aspirin Instructions:  ERAS Protcol - PRE-SURGERY G2-   COVID TEST- 02-14-21 COVID vaccine -  Activity-- Anesthesia review: CAD/ stent, MI, DM, creatine 1.95  Patient denies shortness of breath, fever, cough and chest pain at PAT appointment   All instructions explained to the patient, with a verbal understanding of the material. Patient agrees to go over the instructions while at home for a better understanding. Patient also instructed to self quarantine after being tested for COVID-19. The opportunity to ask questions was provided.

## 2021-02-08 ENCOUNTER — Encounter (HOSPITAL_COMMUNITY)
Admission: RE | Admit: 2021-02-08 | Discharge: 2021-02-08 | Disposition: A | Discharge status: Home / Self Care | Payer: Medicare Other | Source: Ambulatory Visit | Attending: Orthopedic Surgery | Admitting: Orthopedic Surgery

## 2021-02-08 ENCOUNTER — Encounter (HOSPITAL_COMMUNITY): Payer: Self-pay

## 2021-02-08 ENCOUNTER — Other Ambulatory Visit: Payer: Self-pay

## 2021-02-08 VITALS — BP 112/84 | HR 86 | Temp 98.3°F | Resp 16 | Ht 59.0 in | Wt 125.4 lb

## 2021-02-08 DIAGNOSIS — F17210 Nicotine dependence, cigarettes, uncomplicated: Secondary | ICD-10-CM | POA: Insufficient documentation

## 2021-02-08 DIAGNOSIS — J449 Chronic obstructive pulmonary disease, unspecified: Secondary | ICD-10-CM | POA: Insufficient documentation

## 2021-02-08 DIAGNOSIS — M75102 Unspecified rotator cuff tear or rupture of left shoulder, not specified as traumatic: Secondary | ICD-10-CM | POA: Insufficient documentation

## 2021-02-08 DIAGNOSIS — I2510 Atherosclerotic heart disease of native coronary artery without angina pectoris: Secondary | ICD-10-CM | POA: Insufficient documentation

## 2021-02-08 DIAGNOSIS — I129 Hypertensive chronic kidney disease with stage 1 through stage 4 chronic kidney disease, or unspecified chronic kidney disease: Secondary | ICD-10-CM | POA: Insufficient documentation

## 2021-02-08 DIAGNOSIS — E1122 Type 2 diabetes mellitus with diabetic chronic kidney disease: Secondary | ICD-10-CM | POA: Insufficient documentation

## 2021-02-08 DIAGNOSIS — K219 Gastro-esophageal reflux disease without esophagitis: Secondary | ICD-10-CM | POA: Diagnosis not present

## 2021-02-08 DIAGNOSIS — E118 Type 2 diabetes mellitus with unspecified complications: Secondary | ICD-10-CM

## 2021-02-08 DIAGNOSIS — I10 Essential (primary) hypertension: Secondary | ICD-10-CM

## 2021-02-08 DIAGNOSIS — Z01818 Encounter for other preprocedural examination: Secondary | ICD-10-CM

## 2021-02-08 DIAGNOSIS — N183 Chronic kidney disease, stage 3 unspecified: Secondary | ICD-10-CM | POA: Diagnosis not present

## 2021-02-08 LAB — BASIC METABOLIC PANEL
Anion gap: 6 (ref 5–15)
BUN: 20 mg/dL (ref 8–23)
CO2: 28 mmol/L (ref 22–32)
Calcium: 9.9 mg/dL (ref 8.9–10.3)
Chloride: 108 mmol/L (ref 98–111)
Creatinine, Ser: 1.95 mg/dL — ABNORMAL HIGH (ref 0.44–1.00)
GFR, Estimated: 27 mL/min — ABNORMAL LOW (ref >60)
Glucose, Bld: 94 mg/dL (ref 70–99)
Potassium: 4.7 mmol/L (ref 3.5–5.1)
Sodium: 142 mmol/L (ref 135–145)

## 2021-02-08 LAB — CBC
HCT: 38.6 % (ref 36.0–46.0)
Hemoglobin: 12.3 g/dL (ref 12.0–15.0)
MCH: 30.4 pg (ref 26.0–34.0)
MCHC: 31.9 g/dL (ref 30.0–36.0)
MCV: 95.5 fL (ref 80.0–100.0)
Platelets: 266 10*3/uL (ref 150–400)
RBC: 4.04 MIL/uL (ref 3.87–5.11)
RDW: 13 % (ref 11.5–15.5)
WBC: 5.8 10*3/uL (ref 4.0–10.5)
nRBC: 0 % (ref 0.0–0.2)

## 2021-02-08 LAB — GLUCOSE, CAPILLARY: Glucose-Capillary: 84 mg/dL (ref 70–99)

## 2021-02-08 LAB — SURGICAL PCR SCREEN
MRSA, PCR: NEGATIVE
Staphylococcus aureus: NEGATIVE

## 2021-02-08 LAB — HEMOGLOBIN A1C
Hgb A1c MFr Bld: 5.9 % — ABNORMAL HIGH (ref 4.8–5.6)
Mean Plasma Glucose: 122.63 mg/dL

## 2021-02-09 ENCOUNTER — Encounter: Payer: Self-pay | Admitting: Hematology and Oncology

## 2021-02-09 ENCOUNTER — Ambulatory Visit
Admission: RE | Admit: 2021-02-09 | Discharge: 2021-02-09 | Disposition: A | Discharge status: Home / Self Care | Payer: Commercial Managed Care - HMO | Source: Ambulatory Visit | Attending: Hematology and Oncology | Admitting: Hematology and Oncology

## 2021-02-09 DIAGNOSIS — Z1231 Encounter for screening mammogram for malignant neoplasm of breast: Secondary | ICD-10-CM

## 2021-02-09 NOTE — Progress Notes (Addendum)
Anesthesia Chart Review   Case: [id number] Date/Time: 02/16/21 1445   Procedure: REVERSE SHOULDER ARTHROPLASTY (Left: Shoulder)   Anesthesia type: General   Pre-op diagnosis: Left shoulder rotator cuff tear arthropathy   Location: [facility] ROOM 06 / [facility]   Surgeons: Justice Britain, MD       DISCUSSION:70 y.o. every day smoker with h/o GERD, HTN, DM II (A1C 5.9), COPD, CAD (PCI to RCA), CKD Stage III, left shoulder rotator cuff tear scheduled for above procedure 02/16/2021 with Dr. Justice Britain.   Clearance from PCP received whish states pt is cleared for surgery, "please also obtain preop clearance from [place]"  Pt last seen by nephrology 11/18/2020. Per notes baseline creatinine 1.6 with fluctuations up to 2.3. Per Dr. Osborne Casco, "There is no renal cause to delay surgery. I cannot provide cardiovascular clearance. This should be done by cardiology or PCP. No NSAIDS. Risk for AKI with surgery is higher than general population given CKD."  Pt last seen by cardiology 11/10/2020. Amlodipine increased at this visit, otherwise pt stable with no cv sx.   Anticipate pt can proceed with planned procedure barring acute status change.   VS: BP 112/84    Pulse 86    Temp 36.8 C (Oral)    Resp 16    Ht 4\' 11"  (1.499 m)    Wt 56.9 kg    LMP  (LMP Unknown) Comment: tubal ligation   SpO2 100%    BMI 25.32 kg/m   PROVIDERS: Gildardo Pounds, NP is PCP   Alphonsus Sias, MD is Cardiologist  LABS: Labs reviewed: Acceptable for surgery. (all labs ordered are listed, but only abnormal results are displayed)  Labs Reviewed  HEMOGLOBIN A1C - Abnormal; Notable for the following components:      Result Value   Hgb A1c MFr Bld 5.9 (*)    All other components within normal limits  BASIC METABOLIC PANEL - Abnormal; Notable for the following components:   Creatinine, Ser 1.95 (*)    GFR, Estimated 27 (*)    All other components within normal limits  SURGICAL PCR SCREEN  CBC  GLUCOSE, CAPILLARY      IMAGES:   EKG: 02/08/2021 Rate 89 bpm Normal sinus rhythm Possible Left atrial enlargement Left axis deviation Minimal voltage criteria for LVH, may be normal variant ( R in aVL ) Abnormal ECG When compared with ECG of 29-May-2019 10:34, PREVIOUS ECG IS PRESENTleft axis deviation is now present  CV: Echo 05/29/2019 1. Left ventricular ejection fraction, by estimation, is 45 to 50%. The  left ventricle has mildly decreased function. Left ventricular diastolic  parameters are consistent with Grade I diastolic dysfunction (impaired  relaxation). There is hypokinesis of  the inferior wall and septal wall.   2. Right ventricular systolic function is normal. The right ventricular  size is normal.   3. Left atrial size was mildly dilated.   4. The mitral valve is normal in structure. No evidence of mitral valve  regurgitation.   5. The aortic valve is tricuspid. Aortic valve regurgitation is mild.   6. The inferior vena cava is normal in size with greater than 50%  respiratory variability, suggesting right atrial pressure of 3 mmHg.  Past Medical History:  Diagnosis Date   Ambulates with cane    straight   Angina    no current problems per patient at PAT appt 11/05/18, nitroglycerin sl 06/30/20   Arthritis    HANDS"   Asthma    Cancer ([place])  09/04/2017   Right Breast   Carpal tunnel syndrome, bilateral 11/26/2016   Cholelithiasis    Cocaine abuse ([place]) 07/2012   per E.R. drug screen   COPD (chronic obstructive pulmonary disease) ([place])    per patient   Coronary artery disease    Diabetes mellitus without mention of complication    type 2   Dysphagia    esophageal dysmotility on 03/2011 esophagram    Fatty liver disease, nonalcoholic 1245   on Imaging.    GERD (gastroesophageal reflux disease)    Headache    Hearing loss    bilateral - no hearing aids   History of colonic polyps 2009   adenomatous 2009, HP in 2009, 2011, 2013.    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   Wears dentures    full upper and partial lower   Wears glasses     Past Surgical History:  Procedure Laterality Date   ABDOMINAL ANGIOGRAM  02/20/2011   Procedure: ABDOMINAL ANGIOGRAM;  Surgeon: Clent Demark, MD;  Location: [facility] CATH LAB;  Service: Cardiovascular;;   BREAST BIOPSY Left    CARPAL TUNNEL RELEASE Right 07/04/2017   Procedure: RIGHT CARPAL TUNNEL RELEASE;  Surgeon: Leanora Cover, MD;  Location: [place];  Service: Orthopedics;  Laterality: Right;   CARPAL TUNNEL RELEASE Left 09/12/2017   Procedure: LEFT CARPAL TUNNEL RELEASE;  Surgeon: Leanora Cover, MD;  Location: [place];  Service: Orthopedics;  Laterality: Left;   CHOLECYSTECTOMY N/A 11/11/2018   Procedure: ATTEMPTED LAPAROSCOPIC CHOLECUSTECOMY,  OPEN CHOLECYSTECTOMY;  Surgeon: Coralie Keens, MD;  Location: [place];  Service: General;  Laterality: N/A;   COLONOSCOPY  11/30/2011   Procedure: COLONOSCOPY;  Surgeon: Lafayette Dragon, MD;  [facility];  Service: Endoscopy;  Laterality: N/A;   CORONARY ANGIOPLASTY WITH STENT PLACEMENT  02/20/2011   ERCP N/A 10/03/2018   Procedure: ENDOSCOPIC RETROGRADE CHOLANGIOPANCREATOGRAPHY (ERCP);  Surgeon: Ladene Artist, MD;  Location: [facility];  Service: Endoscopy;  Laterality: N/A;   ESOPHAGOGASTRODUODENOSCOPY  11/30/2011   Procedure: ESOPHAGOGASTRODUODENOSCOPY (EGD);  Surgeon: Lafayette Dragon, MD;  Location: [facility];  Service: Endoscopy;  Laterality: N/A;   ESOPHAGOGASTRODUODENOSCOPY N/A 02/23/2015   Procedure: ESOPHAGOGASTRODUODENOSCOPY (EGD);  Surgeon: Gatha Mayer, MD;  Location: [facility] ENDOSCOPY;  Service: Endoscopy;  Laterality: N/A;   EYE SURGERY Bilateral    cataracts removed   INTRAOPERATIVE CHOLANGIOGRAM N/A 11/11/2018   Procedure: Intraoperative Cholangiogram;  Surgeon: Coralie Keens, MD;   Location: [place];  Service: General;  Laterality: N/A;   IR REMOVAL TUN ACCESS W/ PORT W/O FL MOD SED  06/30/2020   LEFT HEART CATHETERIZATION WITH CORONARY ANGIOGRAM N/A 02/20/2011   Procedure: LEFT HEART CATHETERIZATION WITH CORONARY ANGIOGRAM;  Surgeon: Clent Demark, MD;  Location: [place] CATH LAB;  Service: Cardiovascular;  Laterality: N/A;   MASTECTOMY Right 11/05/2017   MASTECTOMY W/ SENTINEL NODE BIOPSY Right 11/27/2017   MASTECTOMY W/ SENTINEL NODE BIOPSY Right 11/27/2017   Procedure: RIGHT TOTAL MASTECTOMY WITH SENTINEL LYMPH NODE BIOPSY;  Surgeon: Excell Seltzer, MD;  Location: [place];  Service: General;  Laterality: Right;   PORT A CATH REVISION Left 05/07/2018   Procedure: PORT A CATH REVISION;  Surgeon: Excell Seltzer, MD;  [facility];  Service: General;  Laterality: Left;   PORTACATH PLACEMENT Left 11/27/2017   Procedure: INSERTION PORT-A-CATH;  Surgeon: Excell Seltzer, MD;  Location: [place];  Service: General;  Laterality: Left;   RIGHT COLECTOMY  02/2007   for post polypectomy colonic perforation   SPHINCTEROTOMY  10/03/2018   Procedure: SPHINCTEROTOMY;  Surgeon: Ladene Artist, MD;  [facility];  Service: Endoscopy;;   TUBAL LIGATION      MEDICATIONS:  albuterol (PROVENTIL) (2.5 MG/3ML) 0.083% nebulizer solution   ALPRAZolam (XANAX) 0.25 MG tablet   amLODipine (NORVASC) 10 MG tablet   anastrozole (ARIMIDEX) 1 MG tablet   aspirin 81 MG EC tablet   atorvastatin (LIPITOR) 20 MG tablet   Blood Pressure Monitor DEVI   budesonide-formoterol (SYMBICORT) 160-4.5 MCG/ACT inhaler   diclofenac Sodium (VOLTAREN) 1 % GEL   insulin glargine (LANTUS SOLOSTAR) 100 UNIT/ML Solostar Pen   Insulin Pen Needle (TRUEPLUS PEN NEEDLES) 31G X 8 MM MISC   losartan (COZAAR) 25 MG tablet   metoprolol succinate (TOPROL-XL) 50 MG 24 hr tablet   Misc. Devices MISC   Multiple Vitamin (MULTIVITAMIN WITH MINERALS) TABS tablet   nitroGLYCERIN (NITROSTAT) 0.4 MG SL tablet    Omega-3 Fatty Acids (FISH OIL) 1000 MG CAPS   pantoprazole (PROTONIX) 40 MG tablet   predniSONE (DELTASONE) 5 MG tablet   VICTOZA 18 MG/3ML SOPN   No current facility-administered medications for this encounter.   Konrad Felix Ward, PA-C [facility] Pre-Surgical Testing [phone]

## 2021-02-10 NOTE — Anesthesia Preprocedure Evaluation (Addendum)
Anesthesia Evaluation  Patient identified by MRN, date of birth, ID band Patient awake    Reviewed: Allergy & Precautions, NPO status , Patient's Chart, lab work & pertinent test results  Airway Mallampati: II  TM Distance: >3 FB Neck ROM: Full    Dental  (+) Dental Advisory Given   Pulmonary COPD,  COPD inhaler, Current Smoker and Patient abstained from smoking.,    breath sounds clear to auscultation       Cardiovascular hypertension, Pt. on medications and Pt. on home beta blockers + CAD   Rhythm:Regular Rate:Normal     Neuro/Psych    GI/Hepatic Neg liver ROS, GERD  ,  Endo/Other  diabetes, Type 2, Insulin Dependent  Renal/GU Renal Insufficiency and CRFRenal disease     Musculoskeletal   Abdominal   Peds  Hematology   Anesthesia Other Findings   Reproductive/Obstetrics                          Echo 05/29/2019 1. Left ventricular ejection fraction, by estimation, is 45 to 50%. The  left ventricle has mildly decreased function. Left ventricular diastolic  parameters are consistent with Grade I diastolic dysfunction (impaired  relaxation). There is hypokinesis of  the inferior wall and septal wall.  2. Right ventricular systolic function is normal. The right ventricular  size is normal.  3. Left atrial size was mildly dilated.  4. The mitral valve is normal in structure. No evidence of mitral valve  regurgitation.  5. The aortic valve is tricuspid. Aortic valve regurgitation is mild.  6. The inferior vena cava is normal in size with greater than 50%  Lab Results  Component Value Date   WBC 5.8 02/08/2021   HGB 12.3 02/08/2021   HCT 38.6 02/08/2021   MCV 95.5 02/08/2021   PLT 266 02/08/2021   Lab Results  Component Value Date   CREATININE 1.95 (H) 02/08/2021   BUN 20 02/08/2021   NA 142 02/08/2021   K 4.7 02/08/2021   CL 108 02/08/2021   CO2 28 02/08/2021    Anesthesia  Physical Anesthesia Plan  ASA: 3  Anesthesia Plan: General   Post-op Pain Management: Regional block and Tylenol PO (pre-op)   Induction:   PONV Risk Score and Plan: 2 and Dexamethasone, Ondansetron and Treatment may vary due to age or medical condition  Airway Management Planned: Oral ETT  Additional Equipment: None  Intra-op Plan:   Post-operative Plan: Extubation in OR  Informed Consent: I have reviewed the patients History and Physical, chart, labs and discussed the procedure including the risks, benefits and alternatives for the proposed anesthesia with the patient or authorized representative who has indicated his/her understanding and acceptance.     Dental advisory given  Plan Discussed with:   Anesthesia Plan Comments: (See PAT note 02/08/2021, Konrad Felix Ward, PA-C)       Anesthesia Quick Evaluation

## 2021-02-14 ENCOUNTER — Other Ambulatory Visit: Payer: Self-pay

## 2021-02-14 ENCOUNTER — Encounter (HOSPITAL_COMMUNITY)
Admission: RE | Admit: 2021-02-14 | Discharge: 2021-02-14 | Disposition: A | Discharge status: Home / Self Care | Payer: Medicare Other | Source: Ambulatory Visit | Attending: Orthopedic Surgery | Admitting: Orthopedic Surgery

## 2021-02-14 DIAGNOSIS — R69 Illness, unspecified: Secondary | ICD-10-CM | POA: Diagnosis not present

## 2021-02-14 DIAGNOSIS — Z01818 Encounter for other preprocedural examination: Secondary | ICD-10-CM

## 2021-02-14 DIAGNOSIS — Z01812 Encounter for preprocedural laboratory examination: Secondary | ICD-10-CM | POA: Diagnosis not present

## 2021-02-14 DIAGNOSIS — U071 COVID-19: Secondary | ICD-10-CM | POA: Diagnosis not present

## 2021-02-14 LAB — SARS CORONAVIRUS 2 (TAT 6-24 HRS): SARS Coronavirus 2: POSITIVE — AB

## 2021-02-16 ENCOUNTER — Encounter: Payer: Self-pay | Admitting: Hematology and Oncology

## 2021-02-23 NOTE — Progress Notes (Signed)
Spoke to patient and gave her arrival time of 1220 for her surgery on 03-09-21.  Patient was also advised to stop clear liquids at 1200.  Patent states that she is doing well post Covid with no symptoms.  No change in medical history since PAT appointment.  All questions answered and patient stated understanding.

## 2021-02-24 ENCOUNTER — Inpatient Hospital Stay (HOSPITAL_COMMUNITY): Admission: RE | Admit: 2021-02-24 | Payer: Commercial Managed Care - HMO | Source: Ambulatory Visit

## 2021-03-07 ENCOUNTER — Encounter: Payer: Self-pay | Admitting: Hematology and Oncology

## 2021-03-07 ENCOUNTER — Encounter (HOSPITAL_COMMUNITY): Payer: Medicaid Other

## 2021-03-08 ENCOUNTER — Encounter: Payer: Self-pay | Admitting: Hematology and Oncology

## 2021-03-09 ENCOUNTER — Encounter (HOSPITAL_COMMUNITY)
Admission: RE | Disposition: A | Discharge status: Home / Self Care | Payer: Self-pay | Source: Ambulatory Visit | Attending: Orthopedic Surgery

## 2021-03-09 ENCOUNTER — Other Ambulatory Visit: Payer: Self-pay

## 2021-03-09 ENCOUNTER — Ambulatory Visit (HOSPITAL_COMMUNITY): Payer: Medicare Other | Admitting: Physician Assistant

## 2021-03-09 ENCOUNTER — Ambulatory Visit (HOSPITAL_COMMUNITY)
Admission: RE | Admit: 2021-03-09 | Discharge: 2021-03-10 | Disposition: A | Discharge status: Home / Self Care | Payer: Medicare Other | Source: Ambulatory Visit | Attending: Orthopedic Surgery | Admitting: Orthopedic Surgery

## 2021-03-09 ENCOUNTER — Encounter (HOSPITAL_COMMUNITY): Payer: Self-pay | Admitting: Orthopedic Surgery

## 2021-03-09 ENCOUNTER — Ambulatory Visit (HOSPITAL_COMMUNITY): Payer: Medicare Other | Admitting: Certified Registered Nurse Anesthetist

## 2021-03-09 DIAGNOSIS — M75102 Unspecified rotator cuff tear or rupture of left shoulder, not specified as traumatic: Secondary | ICD-10-CM | POA: Diagnosis not present

## 2021-03-09 DIAGNOSIS — Z794 Long term (current) use of insulin: Secondary | ICD-10-CM | POA: Diagnosis not present

## 2021-03-09 DIAGNOSIS — K219 Gastro-esophageal reflux disease without esophagitis: Secondary | ICD-10-CM | POA: Insufficient documentation

## 2021-03-09 DIAGNOSIS — J449 Chronic obstructive pulmonary disease, unspecified: Secondary | ICD-10-CM | POA: Insufficient documentation

## 2021-03-09 DIAGNOSIS — F17210 Nicotine dependence, cigarettes, uncomplicated: Secondary | ICD-10-CM | POA: Diagnosis not present

## 2021-03-09 DIAGNOSIS — M12812 Other specific arthropathies, not elsewhere classified, left shoulder: Secondary | ICD-10-CM | POA: Diagnosis not present

## 2021-03-09 DIAGNOSIS — M19012 Primary osteoarthritis, left shoulder: Secondary | ICD-10-CM | POA: Diagnosis not present

## 2021-03-09 DIAGNOSIS — I10 Essential (primary) hypertension: Secondary | ICD-10-CM | POA: Insufficient documentation

## 2021-03-09 DIAGNOSIS — E119 Type 2 diabetes mellitus without complications: Secondary | ICD-10-CM | POA: Diagnosis not present

## 2021-03-09 DIAGNOSIS — G8918 Other acute postprocedural pain: Secondary | ICD-10-CM | POA: Diagnosis not present

## 2021-03-09 DIAGNOSIS — Z96612 Presence of left artificial shoulder joint: Secondary | ICD-10-CM | POA: Diagnosis present

## 2021-03-09 DIAGNOSIS — I2510 Atherosclerotic heart disease of native coronary artery without angina pectoris: Secondary | ICD-10-CM | POA: Insufficient documentation

## 2021-03-09 HISTORY — DX: Presence of left artificial shoulder joint: Z96.612

## 2021-03-09 HISTORY — PX: REVERSE SHOULDER ARTHROPLASTY: SHX5054

## 2021-03-09 LAB — GLUCOSE, CAPILLARY
Glucose-Capillary: 84 mg/dL (ref 70–99)
Glucose-Capillary: 97 mg/dL (ref 70–99)
Glucose-Capillary: 279 mg/dL — ABNORMAL HIGH (ref 70–99)

## 2021-03-09 SURGERY — ARTHROPLASTY, SHOULDER, TOTAL, REVERSE
Anesthesia: General | Site: Shoulder | Laterality: Left

## 2021-03-09 MED ORDER — PHENOL 1.4 % MT LIQD: 1.0000 | OROMUCOSAL | Status: DC | PRN

## 2021-03-09 MED ORDER — DIPHENHYDRAMINE HCL 12.5 MG/5ML PO ELIX: 12.5000 mg | ORAL_SOLUTION | ORAL | Status: DC | PRN

## 2021-03-09 MED ORDER — METOCLOPRAMIDE HCL 5 MG PO TABS: 5.0000 mg | ORAL_TABLET | Freq: Three times a day (TID) | ORAL | Status: DC | PRN

## 2021-03-09 MED ORDER — LIDOCAINE HCL (PF) 2 % IJ SOLN
INTRAMUSCULAR | Status: AC
  Filled 2021-03-09: qty 5

## 2021-03-09 MED ORDER — PHENYLEPHRINE 40 MCG/ML (10ML) SYRINGE FOR IV PUSH (FOR BLOOD PRESSURE SUPPORT)
PREFILLED_SYRINGE | INTRAVENOUS | Status: DC | PRN
  Administered 2021-03-09 (×3): 80 ug via INTRAVENOUS

## 2021-03-09 MED ORDER — VANCOMYCIN HCL 1000 MG IV SOLR
INTRAVENOUS | Status: DC | PRN
  Administered 2021-03-09: 1000 mg

## 2021-03-09 MED ORDER — MENTHOL 3 MG MT LOZG: 1.0000 | LOZENGE | OROMUCOSAL | Status: DC | PRN

## 2021-03-09 MED ORDER — DEXAMETHASONE SODIUM PHOSPHATE 10 MG/ML IJ SOLN
INTRAMUSCULAR | Status: AC
  Filled 2021-03-09: qty 1

## 2021-03-09 MED ORDER — PHENYLEPHRINE 40 MCG/ML (10ML) SYRINGE FOR IV PUSH (FOR BLOOD PRESSURE SUPPORT)
PREFILLED_SYRINGE | INTRAVENOUS | Status: AC
  Filled 2021-03-09: qty 10

## 2021-03-09 MED ORDER — STERILE WATER FOR IRRIGATION IR SOLN
Status: DC | PRN
  Administered 2021-03-09: 2000 mL

## 2021-03-09 MED ORDER — IPRATROPIUM-ALBUTEROL 0.5-2.5 (3) MG/3ML IN SOLN
3.0000 mL | RESPIRATORY_TRACT | Status: AC
  Administered 2021-03-09: 3 mL via RESPIRATORY_TRACT
  Filled 2021-03-09: qty 3

## 2021-03-09 MED ORDER — TRANEXAMIC ACID-NACL 1000-0.7 MG/100ML-% IV SOLN
1000.0000 mg | INTRAVENOUS | Status: AC
  Administered 2021-03-09: 1000 mg via INTRAVENOUS
  Filled 2021-03-09: qty 100

## 2021-03-09 MED ORDER — ROCURONIUM BROMIDE 10 MG/ML (PF) SYRINGE
PREFILLED_SYRINGE | INTRAVENOUS | Status: AC
  Filled 2021-03-09: qty 10

## 2021-03-09 MED ORDER — BUPIVACAINE-EPINEPHRINE (PF) 0.25% -1:200000 IJ SOLN
INTRAMUSCULAR | Status: DC | PRN
  Administered 2021-03-09: 15 mL via PERINEURAL

## 2021-03-09 MED ORDER — VANCOMYCIN HCL 1000 MG IV SOLR
INTRAVENOUS | Status: AC
  Filled 2021-03-09: qty 20

## 2021-03-09 MED ORDER — AMISULPRIDE (ANTIEMETIC) 5 MG/2ML IV SOLN
INTRAVENOUS | Status: AC
  Filled 2021-03-09: qty 2

## 2021-03-09 MED ORDER — ATORVASTATIN CALCIUM 20 MG PO TABS
20.0000 mg | ORAL_TABLET | Freq: Every day | ORAL | Status: DC
Start: 2021-03-10 — End: 2021-03-10
  Administered 2021-03-10: 20 mg via ORAL
  Filled 2021-03-09: qty 1

## 2021-03-09 MED ORDER — NITROGLYCERIN 0.4 MG SL SUBL: 0.4000 mg | SUBLINGUAL_TABLET | SUBLINGUAL | Status: DC | PRN

## 2021-03-09 MED ORDER — ONDANSETRON HCL 4 MG/2ML IJ SOLN
INTRAMUSCULAR | Status: AC
  Filled 2021-03-09: qty 2

## 2021-03-09 MED ORDER — FENTANYL CITRATE PF 50 MCG/ML IJ SOSY: 25.0000 ug | PREFILLED_SYRINGE | INTRAMUSCULAR | Status: DC | PRN

## 2021-03-09 MED ORDER — ACETAMINOPHEN 325 MG PO TABS: 325.0000 mg | ORAL_TABLET | Freq: Four times a day (QID) | ORAL | Status: DC | PRN

## 2021-03-09 MED ORDER — PHENYLEPHRINE HCL-NACL 20-0.9 MG/250ML-% IV SOLN
INTRAVENOUS | Status: DC | PRN
  Administered 2021-03-09: 40 ug/min via INTRAVENOUS

## 2021-03-09 MED ORDER — FENTANYL CITRATE PF 50 MCG/ML IJ SOSY
50.0000 ug | PREFILLED_SYRINGE | INTRAMUSCULAR | Status: DC
  Administered 2021-03-09 (×2): 50 ug via INTRAVENOUS
  Filled 2021-03-09: qty 2

## 2021-03-09 MED ORDER — PHENYLEPHRINE HCL (PRESSORS) 10 MG/ML IV SOLN
INTRAVENOUS | Status: AC
  Filled 2021-03-09: qty 1

## 2021-03-09 MED ORDER — AMLODIPINE BESYLATE 10 MG PO TABS
10.0000 mg | ORAL_TABLET | Freq: Every day | ORAL | Status: DC
Start: 2021-03-10 — End: 2021-03-10
  Administered 2021-03-10: 10 mg via ORAL
  Filled 2021-03-09: qty 1

## 2021-03-09 MED ORDER — SUGAMMADEX SODIUM 200 MG/2ML IV SOLN
INTRAVENOUS | Status: DC | PRN
  Administered 2021-03-09: 200 mg via INTRAVENOUS

## 2021-03-09 MED ORDER — ROCURONIUM BROMIDE 10 MG/ML (PF) SYRINGE
PREFILLED_SYRINGE | INTRAVENOUS | Status: DC | PRN
  Administered 2021-03-09: 40 mg via INTRAVENOUS

## 2021-03-09 MED ORDER — CEFAZOLIN SODIUM-DEXTROSE 2-4 GM/100ML-% IV SOLN
2.0000 g | INTRAVENOUS | Status: AC
  Administered 2021-03-09: 2 g via INTRAVENOUS
  Filled 2021-03-09: qty 100

## 2021-03-09 MED ORDER — LOSARTAN POTASSIUM 25 MG PO TABS
25.0000 mg | ORAL_TABLET | Freq: Every day | ORAL | Status: DC
  Administered 2021-03-09 – 2021-03-10 (×2): 25 mg via ORAL
  Filled 2021-03-09 (×2): qty 1

## 2021-03-09 MED ORDER — ACETAMINOPHEN 500 MG PO TABS
1000.0000 mg | ORAL_TABLET | Freq: Once | ORAL | Status: AC
  Administered 2021-03-09: 1000 mg via ORAL
  Filled 2021-03-09: qty 2

## 2021-03-09 MED ORDER — ALBUTEROL SULFATE (2.5 MG/3ML) 0.083% IN NEBU
2.5000 mg | INHALATION_SOLUTION | Freq: Four times a day (QID) | RESPIRATORY_TRACT | Status: DC | PRN
  Administered 2021-03-10: 2.5 mg via RESPIRATORY_TRACT
  Filled 2021-03-09: qty 3

## 2021-03-09 MED ORDER — PREDNISONE 5 MG PO TABS
5.0000 mg | ORAL_TABLET | Freq: Every day | ORAL | Status: DC
  Administered 2021-03-10: 5 mg via ORAL
  Filled 2021-03-09: qty 1

## 2021-03-09 MED ORDER — PANTOPRAZOLE SODIUM 40 MG PO TBEC
40.0000 mg | DELAYED_RELEASE_TABLET | Freq: Every day | ORAL | Status: DC
  Administered 2021-03-10: 40 mg via ORAL
  Filled 2021-03-09: qty 1

## 2021-03-09 MED ORDER — OXYCODONE HCL 5 MG PO TABS
5.0000 mg | ORAL_TABLET | ORAL | Status: DC | PRN
  Administered 2021-03-09: 5 mg via ORAL
  Filled 2021-03-09: qty 1

## 2021-03-09 MED ORDER — LACTATED RINGERS IV SOLN: INTRAVENOUS | Status: DC

## 2021-03-09 MED ORDER — METOCLOPRAMIDE HCL 5 MG/ML IJ SOLN: 5.0000 mg | Freq: Three times a day (TID) | INTRAMUSCULAR | Status: DC | PRN

## 2021-03-09 MED ORDER — BISACODYL 5 MG PO TBEC: 5.0000 mg | DELAYED_RELEASE_TABLET | Freq: Every day | ORAL | Status: DC | PRN

## 2021-03-09 MED ORDER — BUPIVACAINE LIPOSOME 1.3 % IJ SUSP
INTRAMUSCULAR | Status: DC | PRN
Start: 2021-03-09 — End: 2021-03-09
  Administered 2021-03-09: 10 mL via PERINEURAL

## 2021-03-09 MED ORDER — ORAL CARE MOUTH RINSE: 15.0000 mL | Freq: Once | OROMUCOSAL | Status: AC

## 2021-03-09 MED ORDER — METOPROLOL SUCCINATE ER 50 MG PO TB24
50.0000 mg | ORAL_TABLET | Freq: Every day | ORAL | Status: DC
  Administered 2021-03-10: 50 mg via ORAL
  Filled 2021-03-09: qty 1

## 2021-03-09 MED ORDER — AMISULPRIDE (ANTIEMETIC) 5 MG/2ML IV SOLN
5.0000 mg | Freq: Once | INTRAVENOUS | Status: AC | PRN
  Administered 2021-03-09: 5 mg via INTRAVENOUS

## 2021-03-09 MED ORDER — METHOCARBAMOL 500 MG IVPB - SIMPLE MED
500.0000 mg | Freq: Four times a day (QID) | INTRAVENOUS | Status: DC | PRN
  Filled 2021-03-09: qty 50

## 2021-03-09 MED ORDER — MOMETASONE FURO-FORMOTEROL FUM 200-5 MCG/ACT IN AERO
2.0000 | INHALATION_SPRAY | Freq: Two times a day (BID) | RESPIRATORY_TRACT | Status: DC
  Administered 2021-03-09 – 2021-03-10 (×2): 2 via RESPIRATORY_TRACT
  Filled 2021-03-09: qty 8.8

## 2021-03-09 MED ORDER — DOCUSATE SODIUM 100 MG PO CAPS
100.0000 mg | ORAL_CAPSULE | Freq: Two times a day (BID) | ORAL | Status: DC
  Administered 2021-03-09 – 2021-03-10 (×2): 100 mg via ORAL
  Filled 2021-03-09 (×2): qty 1

## 2021-03-09 MED ORDER — LIDOCAINE 2% (20 MG/ML) 5 ML SYRINGE
INTRAMUSCULAR | Status: DC | PRN
  Administered 2021-03-09: 20 mg via INTRAVENOUS

## 2021-03-09 MED ORDER — PROPOFOL 10 MG/ML IV BOLUS
INTRAVENOUS | Status: AC
  Filled 2021-03-09: qty 20

## 2021-03-09 MED ORDER — POLYETHYLENE GLYCOL 3350 17 G PO PACK: 17.0000 g | PACK | Freq: Every day | ORAL | Status: DC | PRN

## 2021-03-09 MED ORDER — ONDANSETRON HCL 4 MG/2ML IJ SOLN: 4.0000 mg | Freq: Four times a day (QID) | INTRAMUSCULAR | Status: DC | PRN

## 2021-03-09 MED ORDER — ASPIRIN EC 81 MG PO TBEC
81.0000 mg | DELAYED_RELEASE_TABLET | Freq: Every day | ORAL | Status: DC
  Administered 2021-03-10: 81 mg via ORAL
  Filled 2021-03-09: qty 1

## 2021-03-09 MED ORDER — ONDANSETRON HCL 4 MG PO TABS: 4.0000 mg | ORAL_TABLET | Freq: Four times a day (QID) | ORAL | Status: DC | PRN

## 2021-03-09 MED ORDER — PROPOFOL 10 MG/ML IV BOLUS
INTRAVENOUS | Status: DC | PRN
  Administered 2021-03-09: 100 mg via INTRAVENOUS

## 2021-03-09 MED ORDER — CHLORHEXIDINE GLUCONATE 0.12 % MT SOLN
15.0000 mL | Freq: Once | OROMUCOSAL | Status: AC
  Administered 2021-03-09: 15 mL via OROMUCOSAL

## 2021-03-09 MED ORDER — HYDROMORPHONE HCL 1 MG/ML IJ SOLN: 0.5000 mg | INTRAMUSCULAR | Status: DC | PRN

## 2021-03-09 MED ORDER — OXYCODONE HCL 5 MG PO TABS: 10.0000 mg | ORAL_TABLET | ORAL | Status: DC | PRN

## 2021-03-09 MED ORDER — INSULIN ASPART 100 UNIT/ML IJ SOLN
0.0000 [IU] | Freq: Three times a day (TID) | INTRAMUSCULAR | Status: DC
  Administered 2021-03-10: 2 [IU] via SUBCUTANEOUS

## 2021-03-09 MED ORDER — PANTOPRAZOLE SODIUM 40 MG PO TBEC: 40.0000 mg | DELAYED_RELEASE_TABLET | Freq: Every day | ORAL | Status: DC

## 2021-03-09 MED ORDER — MIDAZOLAM HCL 2 MG/2ML IJ SOLN
1.0000 mg | INTRAMUSCULAR | Status: DC
  Filled 2021-03-09: qty 2

## 2021-03-09 MED ORDER — 0.9 % SODIUM CHLORIDE (POUR BTL) OPTIME
TOPICAL | Status: DC | PRN
  Administered 2021-03-09: 1000 mL

## 2021-03-09 MED ORDER — ANASTROZOLE 1 MG PO TABS
1.0000 mg | ORAL_TABLET | Freq: Every day | ORAL | Status: DC
  Administered 2021-03-10: 1 mg via ORAL
  Filled 2021-03-09: qty 1

## 2021-03-09 MED ORDER — ONDANSETRON HCL 4 MG/2ML IJ SOLN
INTRAMUSCULAR | Status: DC | PRN
  Administered 2021-03-09: 4 mg via INTRAVENOUS

## 2021-03-09 MED ORDER — METHOCARBAMOL 500 MG PO TABS
500.0000 mg | ORAL_TABLET | Freq: Four times a day (QID) | ORAL | Status: DC | PRN
  Administered 2021-03-09: 500 mg via ORAL
  Filled 2021-03-09: qty 1

## 2021-03-09 MED ORDER — ALUM & MAG HYDROXIDE-SIMETH 200-200-20 MG/5ML PO SUSP: 30.0000 mL | ORAL | Status: DC | PRN

## 2021-03-09 MED ORDER — ALPRAZOLAM 0.25 MG PO TABS
0.2500 mg | ORAL_TABLET | Freq: Every day | ORAL | Status: DC
  Administered 2021-03-09: 0.25 mg via ORAL
  Filled 2021-03-09: qty 1

## 2021-03-09 MED ORDER — DEXAMETHASONE SODIUM PHOSPHATE 10 MG/ML IJ SOLN
INTRAMUSCULAR | Status: DC | PRN
  Administered 2021-03-09: 4 mg via INTRAVENOUS

## 2021-03-09 SURGICAL SUPPLY — 77 items
ADH SKN CLS APL DERMABOND .7 (GAUZE/BANDAGES/DRESSINGS) ×1
AID PSTN UNV HD RSTRNT DISP (MISCELLANEOUS) ×1
BAG COUNTER SPONGE SURGICOUNT (BAG) ×1 IMPLANT
BAG SPEC THK2 15X12 ZIP CLS (MISCELLANEOUS) ×1
BAG SPNG CNTER NS LX DISP (BAG) ×1
BAG ZIPLOCK 12X15 (MISCELLANEOUS) ×3 IMPLANT
BLADE SAW SGTL 83.5X18.5 (BLADE) ×3 IMPLANT
BNDG COHESIVE 4X5 TAN ST LF (GAUZE/BANDAGES/DRESSINGS) ×3 IMPLANT
BSPLAT GLND +2X24 MDLR (Joint) ×1 IMPLANT
COOLER ICEMAN CLASSIC (MISCELLANEOUS) ×3 IMPLANT
COVER BACK TABLE 60X90IN (DRAPES) ×3 IMPLANT
COVER SURGICAL LIGHT HANDLE (MISCELLANEOUS) ×3 IMPLANT
CUP SUT UNIV REVERS 36 NEUTRAL (Cup) ×1 IMPLANT
DERMABOND ADVANCED (GAUZE/BANDAGES/DRESSINGS) ×1
DERMABOND ADVANCED .7 DNX12 (GAUZE/BANDAGES/DRESSINGS) ×2 IMPLANT
DRAPE INCISE IOBAN 66X45 STRL (DRAPES) IMPLANT
DRAPE ORTHO SPLIT 77X108 STRL (DRAPES) ×4
DRAPE SHEET LG 3/4 BI-LAMINATE (DRAPES) ×3 IMPLANT
DRAPE SURG 17X11 SM STRL (DRAPES) ×3 IMPLANT
DRAPE SURG ORHT 6 SPLT 77X108 (DRAPES) ×4 IMPLANT
DRAPE TOP 10253 STERILE (DRAPES) ×3 IMPLANT
DRAPE U-SHAPE 47X51 STRL (DRAPES) ×3 IMPLANT
DRESSING AQUACEL AG SP 3.5X6 (GAUZE/BANDAGES/DRESSINGS) ×2 IMPLANT
DRSG AQUACEL AG ADV 3.5X 6 (GAUZE/BANDAGES/DRESSINGS) ×1 IMPLANT
DRSG AQUACEL AG ADV 3.5X10 (GAUZE/BANDAGES/DRESSINGS) IMPLANT
DRSG AQUACEL AG SP 3.5X6 (GAUZE/BANDAGES/DRESSINGS) ×2
DRSG TEGADERM 8X12 (GAUZE/BANDAGES/DRESSINGS) ×3 IMPLANT
DURAPREP 26ML APPLICATOR (WOUND CARE) ×3 IMPLANT
ELECT BLADE TIP CTD 4 INCH (ELECTRODE) ×3 IMPLANT
ELECT PENCIL ROCKER SW 15FT (MISCELLANEOUS) ×3 IMPLANT
ELECT REM PT RETURN 15FT ADLT (MISCELLANEOUS) ×3 IMPLANT
FACESHIELD WRAPAROUND (MASK) ×8 IMPLANT
FACESHIELD WRAPAROUND OR TEAM (MASK) ×8 IMPLANT
GLENOID UNI REV MOD 24 +2 LAT (Joint) ×1 IMPLANT
GLENOSPHERE 36 +4 LAT/24 (Joint) ×1 IMPLANT
GLOVE SRG 8 PF TXTR STRL LF DI (GLOVE) ×2 IMPLANT
GLOVE SURG ENC MOIS LTX SZ7 (GLOVE) ×3 IMPLANT
GLOVE SURG ENC MOIS LTX SZ7.5 (GLOVE) ×3 IMPLANT
GLOVE SURG UNDER POLY LF SZ7 (GLOVE) ×3 IMPLANT
GLOVE SURG UNDER POLY LF SZ8 (GLOVE) ×2
GOWN STRL REUS W/TWL LRG LVL3 (GOWN DISPOSABLE) ×6 IMPLANT
KIT BASIN OR (CUSTOM PROCEDURE TRAY) ×3 IMPLANT
KIT TURNOVER KIT A (KITS) ×1 IMPLANT
LINER HUMERAL 36 +3MM SM (Shoulder) ×1 IMPLANT
MANIFOLD NEPTUNE II (INSTRUMENTS) ×3 IMPLANT
NDL TAPERED W/ NITINOL LOOP (MISCELLANEOUS) ×2 IMPLANT
NEEDLE TAPERED W/ NITINOL LOOP (MISCELLANEOUS) ×2 IMPLANT
NS IRRIG 1000ML POUR BTL (IV SOLUTION) ×3 IMPLANT
PACK SHOULDER (CUSTOM PROCEDURE TRAY) ×3 IMPLANT
PAD ARMBOARD 7.5X6 YLW CONV (MISCELLANEOUS) ×3 IMPLANT
PAD COLD SHLDR WRAP-ON (PAD) ×3 IMPLANT
PIN NITINOL TARGETER 2.8 (PIN) IMPLANT
PIN SET MODULAR GLENOID SYSTEM (PIN) IMPLANT
RESTRAINT HEAD UNIVERSAL NS (MISCELLANEOUS) ×3 IMPLANT
SCREW CENTRAL MODULAR 25 (Screw) ×1 IMPLANT
SCREW PERI LOCK 5.5X16 (Screw) ×1 IMPLANT
SCREW PERI LOCK 5.5X24 (Screw) ×2 IMPLANT
SCREW PERIPHERAL 5.5X20 LOCK (Screw) ×1 IMPLANT
SLING ARM FOAM STRAP LRG (SOFTGOODS) IMPLANT
SLING ARM FOAM STRAP MED (SOFTGOODS) ×1 IMPLANT
SPONGE T-LAP 18X18 ~~LOC~~+RFID (SPONGE) ×3 IMPLANT
SPONGE T-LAP 4X18 ~~LOC~~+RFID (SPONGE) ×3 IMPLANT
STEM HUMERAL UNI REVERS SZ6 (Stem) ×1 IMPLANT
SUCTION FRAZIER HANDLE 12FR (TUBING) ×2
SUCTION TUBE FRAZIER 12FR DISP (TUBING) ×2 IMPLANT
SUT FIBERWIRE #2 38 T-5 BLUE (SUTURE)
SUT MNCRL AB 3-0 PS2 18 (SUTURE) ×3 IMPLANT
SUT MON AB 2-0 CT1 36 (SUTURE) ×3 IMPLANT
SUT VIC AB 1 CT1 36 (SUTURE) ×3 IMPLANT
SUTURE FIBERWR #2 38 T-5 BLUE (SUTURE) IMPLANT
SUTURE TAPE 1.3 40 TPR END (SUTURE) ×4 IMPLANT
SUTURETAPE 1.3 40 TPR END (SUTURE) ×4
TOWEL OR 17X26 10 PK STRL BLUE (TOWEL DISPOSABLE) ×3 IMPLANT
TOWEL OR NON WOVEN STRL DISP B (DISPOSABLE) ×3 IMPLANT
TUBE SUCTION HIGH CAP CLEAR NV (SUCTIONS) ×3 IMPLANT
WATER STERILE IRR 1000ML POUR (IV SOLUTION) ×6 IMPLANT
YANKAUER SUCT BULB TIP 10FT TU (MISCELLANEOUS) ×3 IMPLANT

## 2021-03-09 NOTE — Discharge Instructions (Signed)
? ?Kevin M. Supple, M.D., F.A.A.O.S. ?Orthopaedic Surgery ?Specializing in Arthroscopic and Reconstructive ?Surgery of the Shoulder ?[phone] ?[address] - [place], [place] [address] - Fax [phone] ? ? ?POST-OP TOTAL SHOULDER REPLACEMENT INSTRUCTIONS ? ?1. Follow up in the office for your first post-op appointment 10-14 days from the date of your surgery. If you do not already have a scheduled appointment, our office will contact you to schedule. ? ?2. The bandage over your incision is waterproof. You may begin showering with this dressing on. You may leave this dressing on until first follow up appointment within 2 weeks. We prefer you leave this dressing in place until follow up however after 5-7 days if you are having itching or skin irritation and would like to remove it you may do so. Go slow and tug at the borders gently to break the bond the dressing has with the skin. At this point if there is no drainage it is okay to go without a bandage or you may cover it with a light guaze and tape. You can also expect significant bruising around your shoulder that will drift down your arm and into your chest wall. This is very normal and should resolve over several days. ? ? 3. Wear your sling/immobilizer at all times except to perform the exercises below or to occasionally let your arm dangle by your side to stretch your elbow. You also need to sleep in your sling immobilizer until instructed otherwise. It is ok to remove your sling if you are sitting in a controlled environment and allow your arm to rest in a position of comfort by your side or on your lap with pillows to give your neck and skin a break from the sling. You may remove it to allow arm to dangle by side to shower. If you are up walking around and when you go to sleep at night you need to wear it. ? ?4. Range of motion to your elbow, wrist, and hand are encouraged 3-5 times daily. Exercise to your hand and fingers helps to reduce  swelling you may experience. ? ? ?5. Prescriptions for a pain medication and a muscle relaxant are provided for you. It is recommended that if you are experiencing pain that you pain medication alone is not controlling, add the muscle relaxant along with the pain medication which can give additional pain relief. The first 1-2 days is generally the most severe of your pain and then should gradually decrease. As your pain lessens it is recommended that you decrease your use of the pain medications to an "as needed basis'" only and to always comply with the recommended dosages of the pain medications. ? ?6. Pain medications can produce constipation along with their use. If you experience this, the use of an over the counter stool softener or laxative daily is recommended.  ? ?7. For additional questions or concerns, please do not hesitate to call the office. If after hours there is an answering service to forward your concerns to the physician on call. ? ?8.Pain control following an exparel block ? ?To help control your post-operative pain you received a nerve block  performed with Exparel which is a long acting anesthetic (numbing agent) which can provide pain relief and sensations of numbness (and relief of pain) in the operative shoulder and arm for up to 3 days. Sometimes it provides mixed relief, meaning you may still have numbness in certain areas of the arm but can still be able to   move  parts of that arm, hand, and fingers. We recommend that your prescribed pain medications  be used as needed. We do not feel it is necessary to "pre medicate" and "stay ahead" of pain.  Taking narcotic pain medications when you are not having any pain can lead to unnecessary and potentially dangerous side effects.   ? ?9. Use the ice machine as much as possible in the first 5-7 days from surgery, then you can wean its use to as needed. The ice typically needs to be replaced every 6 hours, instead of ice you can actually freeze  water bottles to put in the cooler and then fill water around them to avoid having to purchase ice. You can have spare water bottles freezing to allow you to rotate them once they have melted. Try to have a thin shirt or light cloth or towel under the ice wrap to protect your skin.  ? ?FOR ADDITIONAL INFO ON ICE MACHINE AND INSTRUCTIONS GO TO THE WEBSITE AT ? ?https://www.[employer].com/products/donjoy/donjoy-iceman-[email or web address] ? ?10.  We recommend that you avoid any dental work or cleaning in the first 3 months following your joint replacement. This is to help minimize the possibility of infection from the bacteria in your mouth that enters your bloodstream during dental work. We also recommend that you take an antibiotic prior to your dental work for the first year after your shoulder replacement to further help reduce that risk. Please simply contact our office for antibiotics to be sent to your pharmacy prior to dental work. ? ?11. Dental Antibiotics: ? ?We recommend waiting at least 3 months for any dental work even cleanings unless there is a dental emergency. We also recommend  prophylactic antibiotics for all dental procdeures  the first year following your joint replacement. In some exceptions we recommend them to be used lifelong. We will provide you with that prescription in follow up office visits, or you can call our office. ? ?Exceptions are as follows: ? ?1. History of prior total joint infection ? ?2. Severely immunocompromised (Organ Transplant, cancer chemotherapy, Rheumatoid biologic ?meds such as Humera) ? ?3. Poorly controlled diabetes (A1C &gt; 8.0, blood glucose over 200) ? ? ?POST-OP EXERCISES ? ?Pendulum Exercises ? ?Perform pendulum exercises while standing and bending at the waist. Support your uninvolved arm on a table or chair and allow your operated arm to hang freely. Make sure to do these exercises passively - not using you shoulder muscles. These exercises can be performed once your  nerve block effects have worn off. ? ?Repeat 20 times. Do 3 sessions per day. ? ? ?  ?

## 2021-03-09 NOTE — Anesthesia Procedure Notes (Signed)
Procedure Name: Intubation Date/Time: 03/09/2021 1:04 PM Performed by: Genelle Bal, CRNA Pre-anesthesia Checklist: Patient identified, Emergency Drugs available, Suction available and Patient being monitored Patient Re-evaluated:Patient Re-evaluated prior to induction Oxygen Delivery Method: Circle system utilized Preoxygenation: Pre-oxygenation with 100% oxygen Induction Type: IV induction Ventilation: Mask ventilation without difficulty Laryngoscope Size: Miller and 2 Grade View: Grade I Tube type: Oral Tube size: 7.0 mm Number of attempts: 1 Airway Equipment and Method: Stylet and Oral airway Placement Confirmation: ETT inserted through vocal cords under direct vision, positive ETCO2 and breath sounds checked- equal and bilateral Secured at: 20 cm Tube secured with: Tape Dental Injury: Teeth and Oropharynx as per pre-operative assessment

## 2021-03-09 NOTE — Op Note (Signed)
03/09/2021  2:32 PM  PATIENT:   [name]  71 y.o. female  PRE-OPERATIVE DIAGNOSIS:  Left shoulder rotator cuff tear arthropathy  POST-OPERATIVE DIAGNOSIS: Same  PROCEDURE: Left shoulder reverse arthroplasty utilizing a press-fit size 6 Arthrex stem with a neutral metaphysis, +3 polyethylene insert, 36/+4 glenosphere and a small/+2 baseplate  SURGEON:  Mikayla Chiusano, Metta Clines M.D.  ASSISTANTS: Jenetta Loges, PA-C  ANESTHESIA:   General endotracheal and interscalene block with Exparel  EBL: 150 cc  SPECIMEN: None  Drains: None   PATIENT DISPOSITION:  PACU - hemodynamically stable.    PLAN OF CARE: Admit for overnight observation  Brief history:  [name] is a 71 year old female who has been followed for chronic and progressive increasing left shoulder pain related to a severe rotator cuff tear arthropathy.  Due to her increasing functional limitations and failure to respond to prolonged attempts at conservative management, she is brought to the operating room at this time for planned left shoulder reverse arthroplasty.  Preoperatively, I counseled the patient regarding treatment options and risks versus benefits thereof.  Possible surgical complications were all reviewed including potential for bleeding, infection, neurovascular injury, persistent pain, loss of motion, anesthetic complication, failure of the implant, and possible need for additional surgery. They understand and accept and agrees with our planned procedure.   Procedure in detail:  After undergoing routine preop evaluation the patient received prophylactic antibiotics and an interscalene block with Exparel was established in the holding area by the anesthesia department.  Patient was subsequently placed supine on the operating table and underwent the smooth induction of a general endotracheal anesthesia.  Placed into the beachchair position and appropriately padded and protected.  Left shoulder girdle region was  sterilely prepped and draped in standard fashion.  Timeout was called.  A deltopectoral approach to the left shoulder was made through an approximately a centimeter incision.  Skin flaps were elevated dissection carried deep and the deltopectoral interval was developed from proximal to this with the vein taken laterally.  Upper centimeter the pectoralis major was tenotomized for exposure.  Conjoined tendon mobilized and retracted medially.  Adhesions divided beneath the deltoid.  Long head biceps tendon was tenodesed at the upper border the pectoralis major tendon the proximal segment was unroofed and excised.  Remnant of the rotator cuff superiorly was from the apex of the bicipital groove to the base of the coracoid and the subscap was then elevated from left tuberosity using electrocautery the free margin was then tagged with a pair of suture tape sutures.  Capsular attachments were divided in a subperiosteal fashion around the anterior and infra margins of the humeral neck allowing delivery of the humeral head through the wound.  An extra medullary guide was then used to outline the proposed humeral head resection which performed with an oscillating saw at approximate 20 degrees retroversion.  Rondure was used to remove the osteophytes on the humeral neck and a metal cap was placed over the cut proximal humeral surface.  This point the glenoid was then exposed with the appropriate retractors and a circumferential labral resection was performed.  Of note there was significant synovitis and extensive synovectomy was performed.  A guidepin was then directed into the center of the glenoid and the glenoid was then reamed with a central followed by peripheral reamer to a stable subchondral bony bed.  Preparation completed with the central drill and tapped for a 25 mm lag screw.  Our baseplate was then assembled.  Vancomycin powder applied  to the threads of the lag screw the baseplate was then inserted with excellent  fit and fixation.  All of the peripheral locking screws were then placed using standard technique.  A 36/+4 glenosphere was then impacted onto the baseplate and a central locking screw was placed.  We then returned our attention back to the proximal humerus where we hand reamed up to 6 and broached up to a size 6 at 20 degrees retroversion.  Initial attempts at reduction demonstrated excessive soft tissue tension so we rebroached seating the size 6 to the appropriate level and then once again prepare the metaphysis and at this point a trial reduction showed good motion good stability good soft tissue balance.  This point the trial implant was removed.  The final implant was assembled.  The canal was cleaned and dried with vancomycin powder applied into the canal and the final implant was then seated with excellent fit and fixation.  Trial reduction showed good soft tissue balance with a +3 polyinsert.  Trial was then removed the final polyp was impacted onto the implant after it had been cleaned and dried.  Our final reduction was then performed again showing excellent motion stability and soft tissue balance.  The wound was copiously irrigated.  Hemostasis was obtained.  Elasticity of the subscap was confirmed and was then repaired back to the eyelets on the collar of the implant using the previously placed suture tape sutures.  Vancomycin powder was then spread liberally throughout the deep soft tissue planes.  The deltopectoral interval was reapproximated with a series of figure-of-eight number Vicryl sutures.  2-0 Monocryl used to close the subcu layer and intracuticular 3-0 Monocryl for the skin followed by Dermabond and Aquacel dressing.  Left arm was then placed into a sling.  The patient was awakened, extubated, and taken to the recovery room in stable condition.  Jenetta Loges, PA-C was utilized as an Environmental consultant throughout this case, essential for help with positioning the patient, positioning extremity,  tissue manipulation, implantation of the prosthesis, suture management, wound closure, and intraoperative decision-making.  Marin Shutter MD   Contact # [phone]

## 2021-03-09 NOTE — Progress Notes (Signed)
Assisted Dr. Suzette Battiest with Left Interscalene brachial plexus block. Side rails up, monitors on throughout procedure. See vital signs in flow sheet. Tolerated Procedure well.

## 2021-03-09 NOTE — H&P (Signed)
[name]    Chief Complaint: Left shoulder rotator cuff tear arthropathy HPI: The patient is a 71 y.o. female with chronic and progressively increasing left shoulder pain related to severe rotator cuff tear arthropathy.  Due to her increasing functional limitations and failure to respond to prolonged attempts at conservative management, she is brought to the operating room today for planned left shoulder reverse arthroplasty  Past Medical History:  Diagnosis Date   Ambulates with cane    straight   Angina    no current problems per patient at PAT appt 11/05/18, nitroglycerin sl 06/30/20   Arthritis    HANDS"   Asthma    Cancer ([place]) 09/04/2017   Right Breast   Carpal tunnel syndrome, bilateral 11/26/2016   Cholelithiasis    Cocaine abuse ([place]) 07/2012   per E.R. drug screen   COPD (chronic obstructive pulmonary disease) ([place])    per patient   Coronary artery disease    Diabetes mellitus without mention of complication    type 2   Dysphagia    esophageal dysmotility on 03/2011 esophagram    Fatty liver disease, nonalcoholic 1017   on Imaging.    GERD (gastroesophageal reflux disease)    Headache    Hearing loss    bilateral - no hearing aids   History of colonic polyps 2009   adenomatous 2009, HP in 2009, 2011, 2013.    Hyperlipidemia    Hypertension    Iron deficiency anemia    Myocardial infarction [facility])    years ago, 6 stents placed   Ulnar neuropathy at elbow, 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   Wears dentures    full upper and partial lower   Wears glasses     Past Surgical History:  Procedure Laterality Date   ABDOMINAL ANGIOGRAM  02/20/2011   Procedure: ABDOMINAL ANGIOGRAM;  Surgeon: Clent Demark, MD;  Location: [facility] CATH LAB;  Service: Cardiovascular;;   BREAST BIOPSY Left    CARPAL TUNNEL RELEASE Right 07/04/2017   Procedure: RIGHT CARPAL TUNNEL RELEASE;  Surgeon: Leanora Cover, MD;  Location: [place];  Service: Orthopedics;  Laterality: Right;   CARPAL  TUNNEL RELEASE Left 09/12/2017   Procedure: LEFT CARPAL TUNNEL RELEASE;  Surgeon: Leanora Cover, MD;  Location: [place];  Service: Orthopedics;  Laterality: Left;   CHOLECYSTECTOMY N/A 11/11/2018   Procedure: ATTEMPTED LAPAROSCOPIC CHOLECUSTECOMY,  OPEN CHOLECYSTECTOMY;  Surgeon: Coralie Keens, MD;  Location: [place];  Service: General;  Laterality: N/A;   COLONOSCOPY  11/30/2011   Procedure: COLONOSCOPY;  Surgeon: Lafayette Dragon, MD;  Location: [facility] ENDOSCOPY;  Service: Endoscopy;  Laterality: N/A;   CORONARY ANGIOPLASTY WITH STENT PLACEMENT  02/20/2011   ERCP N/A 10/03/2018   Procedure: ENDOSCOPIC RETROGRADE CHOLANGIOPANCREATOGRAPHY (ERCP);  Surgeon: Ladene Artist, MD;  Location: [facility];  Service: Endoscopy;  Laterality: N/A;   ESOPHAGOGASTRODUODENOSCOPY  11/30/2011   Procedure: ESOPHAGOGASTRODUODENOSCOPY (EGD);  Surgeon: Lafayette Dragon, MD;  Location: [facility];  Service: Endoscopy;  Laterality: N/A;   ESOPHAGOGASTRODUODENOSCOPY N/A 02/23/2015   Procedure: ESOPHAGOGASTRODUODENOSCOPY (EGD);  Surgeon: Gatha Mayer, MD;  Location: [facility] ENDOSCOPY;  Service: Endoscopy;  Laterality: N/A;   EYE SURGERY Bilateral    cataracts removed   INTRAOPERATIVE CHOLANGIOGRAM N/A 11/11/2018   Procedure: Intraoperative Cholangiogram;  Surgeon: Coralie Keens, MD;  Location: [place];  Service: General;  Laterality: N/A;   IR REMOVAL TUN ACCESS W/ PORT W/O FL MOD SED  06/30/2020   LEFT HEART CATHETERIZATION WITH CORONARY ANGIOGRAM N/A 02/20/2011  Procedure: LEFT HEART CATHETERIZATION WITH CORONARY ANGIOGRAM;  Surgeon: Clent Demark, MD;  Location: [facility] CATH LAB;  Service: Cardiovascular;  Laterality: N/A;   MASTECTOMY Right 11/05/2017   MASTECTOMY W/ SENTINEL NODE BIOPSY Right 11/27/2017   MASTECTOMY W/ SENTINEL NODE BIOPSY Right 11/27/2017   Procedure: RIGHT TOTAL MASTECTOMY WITH SENTINEL LYMPH NODE BIOPSY;  Surgeon: Excell Seltzer, MD;  Location: [place];  Service: General;  Laterality:  Right;   PORT A CATH REVISION Left 05/07/2018   Procedure: PORT A CATH REVISION;  Surgeon: Excell Seltzer, MD;  Location: [facility];  Service: General;  Laterality: Left;   PORTACATH PLACEMENT Left 11/27/2017   Procedure: INSERTION PORT-A-CATH;  Surgeon: Excell Seltzer, MD;  Location: [place];  Service: General;  Laterality: Left;   RIGHT COLECTOMY  02/2007   for post polypectomy colonic perforation   SPHINCTEROTOMY  10/03/2018   Procedure: SPHINCTEROTOMY;  Surgeon: Ladene Artist, MD;  [facility] ENDOS[facility];  Service: Endoscopy;;   TUBAL LIGATION      Family History  Problem Relation Age of Onset   Heart disease Mother    Diabetes Mother    Cancer Father        unsure what kind   Diabetes Sister    Colon cancer Neg Hx    Breast cancer Neg Hx     Social History:  reports that she has been smoking cigarettes. She has a 11.75 pack-year smoking history. She has never used smokeless tobacco. She reports that she does not currently use alcohol. She reports that she does not currently use drugs after having used the following drugs: Cocaine.   Medications Prior to Admission  Medication Sig Dispense Refill   albuterol (PROVENTIL) (2.5 MG/3ML) 0.083% nebulizer solution Take 3 mLs (2.5 mg total) by nebulization every 6 (six) hours as needed for wheezing or shortness of breath. 75 mL 3   ALPRAZolam (XANAX) 0.25 MG tablet Take 0.25 mg by mouth at bedtime.     amLODipine (NORVASC) 10 MG tablet Take 10 mg by mouth daily.     anastrozole (ARIMIDEX) 1 MG tablet Take 1 tablet (1 mg total) by mouth daily. 90 tablet 3   aspirin 81 MG EC tablet Take 1 tablet (81 mg total) by mouth daily. 30 tablet 0   atorvastatin (LIPITOR) 20 MG tablet Take 20 mg by mouth daily.     budesonide-formoterol (SYMBICORT) 160-4.5 MCG/ACT inhaler Inhale 1 puff into the lungs 2 (two) times daily as needed (respiratory issues.). 1 each 6   insulin glargine (LANTUS SOLOSTAR) 100 UNIT/ML Solostar Pen Inject 5 Units into the  skin at bedtime.     losartan (COZAAR) 25 MG tablet Take 1 tablet (25 mg total) by mouth daily. 90 tablet 0   metoprolol succinate (TOPROL-XL) 50 MG 24 hr tablet Take 50 mg by mouth daily.     Multiple Vitamin (MULTIVITAMIN WITH MINERALS) TABS tablet Take 1 tablet by mouth daily. Sentry     nitroGLYCERIN (NITROSTAT) 0.4 MG SL tablet Place 1 tablet (0.4 mg total) under the tongue every 5 (five) minutes as needed for chest pain. 10 tablet 0   Omega-3 Fatty Acids (FISH OIL) 1000 MG CAPS Take 1,000 mg by mouth daily.     pantoprazole (PROTONIX) 40 MG tablet Take 40 mg by mouth daily.     predniSONE (DELTASONE) 5 MG tablet Take 1 tablet (5 mg total) by mouth daily with breakfast. 3 tablet 0   VICTOZA 18 MG/3ML SOPN INJECT 1.8 MG INTO THE SKIN DAILY  9 mL 2   Blood Pressure Monitor DEVI Please provide patient with insurance approved blood pressure monitor. I10.0 1 each 0   diclofenac Sodium (VOLTAREN) 1 % GEL Apply 4 g topically 4 (four) times daily. (Patient not taking: Reported on 01/30/2021) 100 g 1   Insulin Pen Needle (TRUEPLUS PEN NEEDLES) 31G X 8 MM MISC Inject into the skin once nightly.  E11.65 100 each 1   Misc. Devices MISC Please provide patient with nebulizer mask and tubing. ICD-10J44.9 1 each 0     Physical Exam: Left shoulder demonstrates painful and guarded motion as noted at recent office visits.  Severe pain with global weakness.  Neurovascular intact.  Radiographs  Plain films of the left shoulder show severe arthritis.  Recent MRI scan confirms chronic and severely retracted tear of the rotator cuff.  Vitals  Temp:  [98.3 F (36.8 C)] 98.3 F (36.8 C) (02/02 1033) Pulse Rate:  [62-90] 77 (02/02 1203) Resp:  [13-29] 15 (02/02 1203) BP: (99-114)/(57-84) 99/80 (02/02 1203) SpO2:  [92 %-100 %] 96 % (02/02 1203) Weight:  [56.7 kg] 56.7 kg (02/02 1018)  Assessment/Plan  Impression: Left shoulder rotator cuff tear arthropathy  Plan of Action: Procedure(s): REVERSE  SHOULDER ARTHROPLASTY  Halley Kincer M Johnell Bas 03/09/2021, 12:10 PM Contact # [phone]

## 2021-03-09 NOTE — Anesthesia Procedure Notes (Signed)
Anesthesia Regional Block: Interscalene brachial plexus block   Pre-Anesthetic Checklist: , timeout performed,  Correct Patient, Correct Site, Correct Laterality,  Correct Procedure, Correct Position, site marked,  Risks and benefits discussed,  Surgical consent,  Pre-op evaluation,  At surgeon's request and post-op pain management  Laterality: Left  Prep: chloraprep       Needles:  Injection technique: Single-shot  Needle Type: Echogenic Stimulator Needle     Needle Length: 9cm  Needle Gauge: 21     Additional Needles:   Procedures:, nerve stimulator,,, ultrasound used (permanent image in chart),,     Nerve Stimulator or Paresthesia:  Response: deltoid and bicep, 0.5 mA  Additional Responses:   Narrative:  Start time: 03/09/2021 11:40 AM End time: 03/09/2021 11:46 AM Injection made incrementally with aspirations every 5 mL.  Performed by: Personally  Anesthesiologist: Suzette Battiest, MD

## 2021-03-09 NOTE — Transfer of Care (Signed)
Immediate Anesthesia Transfer of Care Note  Patient: [name]  Procedure(s) Performed: REVERSE SHOULDER ARTHROPLASTY (Left: Shoulder)  Patient Location: PACU  Anesthesia Type:GA combined with regional for post-op pain  Level of Consciousness: drowsy and patient cooperative  Airway & Oxygen Therapy: Patient Spontanous Breathing and Patient connected to face mask oxygen  Post-op Assessment: Report given to RN and Post -op Vital signs reviewed and stable  Post vital signs: Reviewed and stable  Last Vitals:  Vitals Value Taken Time  BP 154/96 03/09/21 1452  Temp    Pulse 78 03/09/21 1454  Resp 20 03/09/21 1454  SpO2 99 % 03/09/21 1454  Vitals shown include unvalidated device data.  Last Pain:  Vitals:   03/09/21 1042  TempSrc:   PainSc: 4       Patients Stated Pain Goal: 3 (32/95/18 8416)  Complications: No notable events documented.

## 2021-03-10 ENCOUNTER — Encounter (HOSPITAL_COMMUNITY): Payer: Self-pay | Admitting: Orthopedic Surgery

## 2021-03-10 DIAGNOSIS — Z794 Long term (current) use of insulin: Secondary | ICD-10-CM | POA: Diagnosis not present

## 2021-03-10 DIAGNOSIS — J449 Chronic obstructive pulmonary disease, unspecified: Secondary | ICD-10-CM | POA: Diagnosis not present

## 2021-03-10 DIAGNOSIS — E119 Type 2 diabetes mellitus without complications: Secondary | ICD-10-CM | POA: Diagnosis not present

## 2021-03-10 DIAGNOSIS — M75102 Unspecified rotator cuff tear or rupture of left shoulder, not specified as traumatic: Secondary | ICD-10-CM | POA: Diagnosis not present

## 2021-03-10 DIAGNOSIS — K219 Gastro-esophageal reflux disease without esophagitis: Secondary | ICD-10-CM | POA: Diagnosis not present

## 2021-03-10 DIAGNOSIS — F17210 Nicotine dependence, cigarettes, uncomplicated: Secondary | ICD-10-CM | POA: Diagnosis not present

## 2021-03-10 DIAGNOSIS — I10 Essential (primary) hypertension: Secondary | ICD-10-CM | POA: Diagnosis not present

## 2021-03-10 DIAGNOSIS — I2510 Atherosclerotic heart disease of native coronary artery without angina pectoris: Secondary | ICD-10-CM | POA: Diagnosis not present

## 2021-03-10 LAB — GLUCOSE, CAPILLARY
Glucose-Capillary: 145 mg/dL — ABNORMAL HIGH (ref 70–99)
Glucose-Capillary: 181 mg/dL — ABNORMAL HIGH (ref 70–99)

## 2021-03-10 MED ORDER — HYDROCODONE-ACETAMINOPHEN 5-325 MG PO TABS: 1.0000 | ORAL_TABLET | Freq: Four times a day (QID) | ORAL | 0 refills | Status: DC | PRN

## 2021-03-10 NOTE — Plan of Care (Signed)
Plan of care reviewed and discussed with the patient. 

## 2021-03-10 NOTE — Progress Notes (Signed)
°  Transition of Care [facility]) Screening Note   Patient Details  Name: [name] Date of Birth: 1951-01-28   Transition of Care [facility]) CM/SW Contact:    Lennart Pall, LCSW Phone Number: 03/10/2021, 10:48 AM    Transition of Care Department [facility]) has reviewed patient and no TOC needs have been identified at this time. We will continue to monitor patient advancement through interdisciplinary progression rounds. If new patient transition needs arise, please place a TOC consult.

## 2021-03-10 NOTE — Anesthesia Postprocedure Evaluation (Signed)
Anesthesia Post Note  Patient: [name]  Procedure(s) Performed: REVERSE SHOULDER ARTHROPLASTY (Left: Shoulder)     Patient location during evaluation: PACU Anesthesia Type: General Level of consciousness: awake and alert Pain management: pain level controlled Vital Signs Assessment: post-procedure vital signs reviewed and stable Respiratory status: spontaneous breathing, nonlabored ventilation, respiratory function stable and patient connected to nasal cannula oxygen Cardiovascular status: blood pressure returned to baseline and stable Postop Assessment: no apparent nausea or vomiting Anesthetic complications: no   No notable events documented.  Last Vitals:  Vitals:   03/10/21 0642 03/10/21 0945  BP: (!) 152/89 (!) 160/66  Pulse: 79 84  Resp: 18 20  Temp: 36.9 C 37.2 C  SpO2: 100% 98%    Last Pain:  Vitals:   03/10/21 0945  TempSrc: Oral  PainSc:                  Barnet Glasgow

## 2021-03-10 NOTE — Discharge Summary (Signed)
PATIENT ID:      [name]  MRN:     [id number] DOB/AGE:    1950/04/16 / 71 y.o.     DISCHARGE SUMMARY  ADMISSION DATE:    03/09/2021 DISCHARGE DATE:  03/10/2021  ADMISSION DIAGNOSIS: Left shoulder rotator cuff tear arthropathy Past Medical History:  Diagnosis Date   Ambulates with cane    straight   Angina    no current problems per patient at PAT appt 11/05/18, nitroglycerin sl 06/30/20   Arthritis    HANDS"   Asthma    Cancer ([place]) 09/04/2017   Right Breast   Carpal tunnel syndrome, bilateral 11/26/2016   Cholelithiasis    Cocaine abuse ([place]) 07/2012   per E.R. drug screen   COPD (chronic obstructive pulmonary disease) ([place])    per patient   Coronary artery disease    Diabetes mellitus without mention of complication    type 2   Dysphagia    esophageal dysmotility on 03/2011 esophagram    Fatty liver disease, nonalcoholic 8101   on Imaging.    GERD (gastroesophageal reflux disease)    Headache    Hearing loss    bilateral - no hearing aids   History of colonic polyps 2009   adenomatous 2009, HP in 2009, 2011, 2013.    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  HOSPITAL COURSE:  [name] is a 71 y.o. admitted on 03/09/2021 with a diagnosis of Left shoulder rotator cuff tear arthropathy.  They were brought to the operating room on 03/09/2021 and underwent Procedure(s): [place].    They were given perioperative antibiotics:  Anti-infectives (From admission, onward)    Start     Dose/Rate Route Frequency Ordered Stop   03/09/21 1337  vancomycin  (VANCOCIN) powder  Status:  Discontinued          As needed 03/09/21 1337 03/09/21 1631   03/09/21 1030  ceFAZolin (ANCEF) IVPB 2g/100 mL premix        2 g 200 mL/hr over 30 Minutes Intravenous On call to O.R. 03/09/21 1016 03/09/21 1340     .  Patient underwent the above named procedure and tolerated it well. The following day they were hemodynamically stable and pain was controlled on oral analgesics. She was kept to monitor her respiratory status, she at baseline has wheezing controlled on home nebs that was persistent but stable following covid. She has been stable overnight on O2 They were neurovascularly intact to the operative extremity. OT was ordered and worked with patient per protocol. They were medically and orthopaedically stable for discharge on day1 .    DIAGNOSTIC STUDIES:  RECENT RADIOGRAPHIC STUDIES :  MM 3D SCREEN BREAST UNI LEFT  Result Date: 02/09/2021 CLINICAL DATA:  Screening. EXAM: DIGITAL SCREENING UNILATERAL LEFT MAMMOGRAM WITH CAD AND TOMOSYNTHESIS TECHNIQUE: Left screening digital craniocaudal and mediolateral oblique mammograms were obtained. Left screening digital breast tomosynthesis was performed. The images were evaluated with computer-aided detection. COMPARISON:  Previous exam(s). ACR Breast Density Category b: There are  scattered areas of fibroglandular density. FINDINGS: The patient has had a right mastectomy. There are no findings suspicious for malignancy. IMPRESSION: No mammographic evidence of malignancy. A result letter of this screening mammogram will be mailed directly to the patient. RECOMMENDATION: Screening mammogram in one year.  (Code:SM-L-35M) BI-RADS CATEGORY  1: Negative. Electronically Signed   By: Ammie Ferrier M.D.   On: 02/09/2021 13:05    RECENT VITAL SIGNS:  Patient Vitals for the past 24 hrs:  BP Temp Temp src Pulse Resp SpO2 Height Weight  03/10/21 0642 (!) 152/89 98.5 F (36.9 C) Oral 79 18 100 % -- --  03/10/21 0238 -- -- -- --  -- 98 % -- --  03/10/21 0203 128/78 98.7 F (37.1 C) Oral 76 17 99 % -- --  03/09/21 2127 (!) 144/67 97.8 F (36.6 C) Oral 89 17 100 % -- --  03/09/21 1919 -- -- -- -- -- 96 % -- --  03/09/21 1639 (!) 168/69 -- -- 70 18 97 % -- --  03/09/21 1615 (!) 170/83 -- -- 65 12 97 % -- --  03/09/21 1600 (!) 167/78 -- -- 79 (!) 22 97 % -- --  03/09/21 1545 (!) 103/47 -- -- 78 16 95 % -- --  03/09/21 1530 (!) 173/62 -- -- 73 16 97 % -- --  03/09/21 1515 (!) 176/68 -- -- 79 15 97 % -- --  03/09/21 1500 (!) 175/79 -- -- 79 20 92 % -- --  03/09/21 1452 (!) 154/96 (!) 97.5 F (36.4 C) -- 81 14 97 % -- --  03/09/21 1218 (!) 102/57 -- -- 78 12 98 % -- --  03/09/21 1213 (!) 100/31 -- -- 76 13 96 % -- --  03/09/21 1208 92/75 -- -- 74 12 98 % -- --  03/09/21 1203 99/80 -- -- 77 15 96 % -- --  03/09/21 1158 (!) 106/57 -- -- 77 20 93 % -- --  03/09/21 1157 -- -- -- 73 16 94 % -- --  03/09/21 1156 -- -- -- 65 15 95 % -- --  03/09/21 1155 -- -- -- 70 20 96 % -- --  03/09/21 1154 -- -- -- 64 15 97 % -- --  03/09/21 1153 107/68 -- -- 82 (!) 29 94 % -- --  03/09/21 1152 -- -- -- 82 (!) 23 95 % -- --  03/09/21 1151 -- -- -- 90 17 92 % -- --  03/09/21 1150 -- -- -- 63 19 94 % -- --  03/09/21 1149 -- -- -- 67 19 94 % -- --  03/09/21 1148 -- -- -- 78 (!) 21 96 % -- --  03/09/21 1147 -- -- -- 62 13 100 % -- --  03/09/21 1146 -- -- -- 75 17 94 % -- --  03/09/21 1145 -- -- -- 71 15 94 % -- --  03/09/21 1144 -- -- -- 68 17 98 % -- --  03/09/21 1143 100/77 -- -- 77 (!) 22 97 % -- --  03/09/21 1142 -- -- -- 72 16 98 % -- --  03/09/21 1141 -- -- -- 77 17 98 % -- --  03/09/21 1140 -- -- -- 77 (!) 21 100 % -- --  03/09/21 1139 -- -- -- 79 20 99 % -- --  03/09/21 1138 114/80 -- -- 76 20 96 % -- --  03/09/21 1137 -- -- -- 78 16 96 % -- --  03/09/21 1033 109/84 98.3 F (36.8 C)  Oral 84 16 99 % -- --  03/09/21 1018 -- -- -- -- -- -- 4\' 11"  (1.499 m) 56.7 kg  .  RECENT EKG RESULTS:    Orders placed or performed  during the hospital encounter of 02/08/21   EKG 12 lead per protocol   EKG 12-Lead   EKG 12-Lead   EKG 12 lead per protocol    DISCHARGE INSTRUCTIONS:    DISCHARGE MEDICATIONS:   Allergies as of 03/10/2021   No Known Allergies      Medication List     TAKE these medications    albuterol (2.5 MG/3ML) 0.083% nebulizer solution Commonly known as: PROVENTIL Take 3 mLs (2.5 mg total) by nebulization every 6 (six) hours as needed for wheezing or shortness of breath.   ALPRAZolam 0.25 MG tablet Commonly known as: XANAX Take 0.25 mg by mouth at bedtime.   amLODipine 10 MG tablet Commonly known as: NORVASC Take 10 mg by mouth daily.   anastrozole 1 MG tablet Commonly known as: ARIMIDEX Take 1 tablet (1 mg total) by mouth daily.   aspirin 81 MG EC tablet Take 1 tablet (81 mg total) by mouth daily.   atorvastatin 20 MG tablet Commonly known as: LIPITOR Take 20 mg by mouth daily.   Blood Pressure Monitor Devi Please provide patient with insurance approved blood pressure monitor. I10.0   budesonide-formoterol 160-4.5 MCG/ACT inhaler Commonly known as: SYMBICORT Inhale 1 puff into the lungs 2 (two) times daily as needed (respiratory issues.).   diclofenac Sodium 1 % Gel Commonly known as: VOLTAREN Apply 4 g topically 4 (four) times daily.   Fish Oil 1000 MG Caps Take 1,000 mg by mouth daily.   HYDROcodone-acetaminophen 5-325 MG tablet Commonly known as: Norco Take 1 tablet by mouth every 6 (six) hours as needed (post op pain).   Lantus SoloStar 100 UNIT/ML Solostar Pen Generic drug: insulin glargine Inject 5 Units into the skin at bedtime.   losartan 25 MG tablet Commonly known as: COZAAR Take 1 tablet (25 mg total) by mouth daily.   metoprolol succinate 50 MG 24 hr tablet Commonly known as: TOPROL-XL Take 50 mg by mouth daily.   Misc. Devices Misc Please provide patient with nebulizer mask and tubing. ICD-10J44.9   multivitamin with minerals Tabs  tablet Take 1 tablet by mouth daily. Sentry   nitroGLYCERIN 0.4 MG SL tablet Commonly known as: NITROSTAT Place 1 tablet (0.4 mg total) under the tongue every 5 (five) minutes as needed for chest pain.   pantoprazole 40 MG tablet Commonly known as: PROTONIX Take 40 mg by mouth daily.   predniSONE 5 MG tablet Commonly known as: DELTASONE Take 1 tablet (5 mg total) by mouth daily with breakfast.   TRUEplus Pen Needles 31G X 8 MM Misc Generic drug: Insulin Pen Needle Inject into the skin once nightly.  E11.65   Victoza 18 MG/3ML Sopn Generic drug: liraglutide INJECT 1.8 MG INTO THE SKIN DAILY        FOLLOW UP VISIT:    Follow-up Information     Justice Britain, MD Follow up.   Specialty: Orthopedic Surgery Why: 03-24-2021 at 3:00 PM for post-op Contact information: [address] [place] [address] [phone]                 DISCHARGE TO: Home   DISCHARGE CONDITION:  Thereasa Parkin Kiandra Sanguinetti for Dr. Justice Britain 03/10/2021, 7:49 AM

## 2021-03-10 NOTE — Progress Notes (Signed)
Occupational Therapy Evaluation  Patient is a 71 year old female s/p shoulder replacement without functional use of left non dominant upper extremity secondary to effects of surgery and interscalene block and shoulder precautions. Therapist provided education and instruction to patient in regards to exercises, precautions, positioning, donning upper extremity clothing and bathing while maintaining shoulder precautions, ice and edema management and donning/doffing sling. Patient verbalized understanding and demonstrated as needed. Patient needed assistance to donn shirt, underwear, pants, socks and shoes and provided with instruction on compensatory strategies to perform ADLs. Patient to follow up with MD for further therapy needs.      03/10/21 1243  OT Visit Information  Last OT Received On 03/10/21  Assistance Needed +1  History of Present Illness Patient is a 71 year old female s/p L reverse total shoulder arthroplasty. PMH includes breast CA, diabetes  Precautions  Precautions Fall;Shoulder  Type of Shoulder Precautions If sitting in controlled environment, ok to come out of sling to give neck a break. Please sleep in it to protect until follow up in office.  OK to use operative arm for feeding, hygiene and ADLs. Ok to instruct Pendulums and lap slides as exercises. Ok to use operative arm within the following parameters for ADL purposes     Ok for PROM, AAROM, AROM within pain tolerance and within the following ROM   ER 20   ABD 45   FE 60. AROM elbow, wrist, hand ok  Shoulder Interventions Shoulder sling/immobilizer;Off for dressing/bathing/exercises  Precaution Booklet Issued Yes (comment)  Required Braces or Orthoses Sling  Restrictions  Weight Bearing Restrictions Yes  LUE Weight Bearing NWB  Home Living  Family/patient expects to be discharged to: Private residence  Living Arrangements Other relatives (grandson)  Available Help at Discharge Other (Comment) (grandson's girlfriend to  assist after sx)  Type of [place] to enter  Entrance Stairs-Number of Steps [place] One level  Bathroom Chief Operating Officer riser;Cane - single point;Rolling Environmental consultant (2 wheels) (has riser but needs someone to put on toilet for her)  Prior Function  Prior Level of Function  Independent/Modified Independent  ADLs Comments I with BADLs, grandson provides transportation  Communication  Communication HOH  Pain Assessment  Pain Assessment Faces  Faces Pain Scale 4  Pain Location L UE  Pain Descriptors / Indicators Heaviness;Numbness  Pain Intervention(s) Monitored during session;Premedicated before session  Cognition  Arousal/Alertness Awake/alert  Behavior During Therapy WFL for tasks assessed/performed  Overall Cognitive Status Within Functional Limits for tasks assessed  Upper Extremity Assessment  Upper Extremity Assessment LUE deficits/detail  LUE Deficits / Details + nerve block  Lower Extremity Assessment  Lower Extremity Assessment Overall WFL for tasks assessed  ADL  Overall ADL's  Needs assistance/impaired  Eating/Feeding Independent  Grooming Independent  Upper Body Bathing Minimal assistance  Lower Body Bathing Independent  Upper Body Dressing  Minimal assistance;Sitting;Cueing for sequencing  Upper Body Dressing Details (indicate cue type and reason) assist to thread L UE  Lower Body Dressing Independent  Lower Body Dressing Details (indicate cue type and reason) don underwear and pants  Toilet Transfer Independent;Stand-pivot  Toilet Transfer Details (indicate cue type and reason) to recliner  Toileting- Clothing Manipulation and Hygiene Independent  Functional mobility during ADLs Independent  General ADL Comments Patient educated in shoulder precautions and how to maintain during self care tasks.  Bed Mobility  Overal bed mobility Modified Independent  Transfers  Overall  transfer level Independent  Balance  Overall balance assessment Modified Independent  Exercises  Exercises Shoulder  Shoulder Instructions  Donning/doffing shirt without moving shoulder Minimal assistance;Patient able to independently direct caregiver  Method for sponge bathing under operated UE Minimal assistance;Patient able to independently direct caregiver  Donning/doffing sling/immobilizer Patient able to independently direct caregiver;Maximal assistance  Correct positioning of sling/immobilizer Minimal assistance;Patient able to independently direct caregiver  Pendulum exercises (written home exercise program) Patient able to independently direct caregiver  ROM for elbow, wrist and digits of operated UE Patient able to independently direct caregiver  Sling wearing schedule (on at all times/off for ADL's) Patient able to independently direct caregiver  Proper positioning of operated UE when showering Patient able to independently direct caregiver  Dressing change  (N/A)  Positioning of UE while sleeping Patient able to independently direct caregiver  OT - End of Session  Equipment Utilized During Treatment Other (comment) (sling)  Activity Tolerance Patient tolerated treatment well  Patient left in chair;with call bell/phone within reach  Nurse Communication Other (comment) (OT complete)  OT Assessment  OT Recommendation/Assessment Progress rehab of shoulder as ordered by MD at follow-up appointment  OT Visit Diagnosis Pain  Pain - Right/Left Left  Pain - part of body Shoulder  OT Problem List Pain;Impaired UE functional use;Decreased knowledge of precautions  AM-PAC OT "6 Clicks" Daily Activity Outcome Measure (Version 2)  Help from another person eating meals? 4  Help from another person taking care of personal grooming? 4  Help from another person toileting, which includes using toliet, bedpan, or urinal? 4  Help from another person bathing (including washing, rinsing,  drying)? 3  Help from another person to put on and taking off regular upper body clothing? 3  Help from another person to put on and taking off regular lower body clothing? 4  6 Click Score 22  Progressive Mobility  What is the highest level of mobility based on the progressive mobility assessment? Level 6 (Walks independently in room and hall) - Balance while walking in room without assist - Complete  Activity Ambulated independently in room;Transferred from bed to chair  OT Recommendation  Follow Up Recommendations Follow physician's recommendations for discharge plan and follow up therapies  Assistance recommended at discharge Intermittent Supervision/Assistance  Patient can return home with the following A little help with bathing/dressing/bathroom  OT Equipment None recommended by OT  Acute Rehab OT Goals  Patient Stated Goal Home with family assist  OT Goal Formulation All assessment and education complete, DC therapy  OT Time Calculation  OT Start Time (ACUTE ONLY) 1030  OT Stop Time (ACUTE ONLY) 1108  OT Time Calculation (min) 38 min  OT General Charges  $OT Visit 1 Visit  OT Evaluation  $OT Eval Low Complexity 1 Low  OT Treatments  $Self Care/Home Management  23-37 mins  Written Expression  Dominant Hand Right   Delbert Phenix OT OT pager: [phone]

## 2021-03-11 ENCOUNTER — Other Ambulatory Visit: Payer: Self-pay | Admitting: Family Medicine

## 2021-03-11 DIAGNOSIS — N1832 Chronic kidney disease, stage 3b: Secondary | ICD-10-CM

## 2021-03-13 NOTE — Telephone Encounter (Signed)
Requested Prescriptions  Pending Prescriptions Disp Refills   [place] 18 MG/3ML SOPN [Pharmacy Med Name: Victoza 3-Pak 0.6 mg/0.1 mL (18 mg/3 mL) subcutaneous pen injector] 9 mL 2    Sig: INJECT 1.8 MG INTO THE SKIN DAILY     Endocrinology:  Diabetes - GLP-1 Receptor Agonists Passed - 03/11/2021  8:02 AM      Passed - HBA1C is between 0 and 7.9 and within 180 days    Hgb A1c MFr Bld  Date Value Ref Range Status  02/08/2021 5.9 (H) 4.8 - 5.6 % Final    Comment:    (NOTE) Pre diabetes:          5.7%-6.4%  Diabetes:              >6.4%  Glycemic control for   <7.0% adults with diabetes          Passed - Valid encounter within last 6 months    Recent Outpatient Visits          2 months ago Essential hypertension   [place], Vernia Buff, NP   2 months ago Essential hypertension   [place], Vernia Buff, NP   3 months ago Cervical radiculopathy   [place], Deborah B, MD   3 months ago Stage 3b chronic kidney disease [facility])   [place] Gildardo Pounds, NP   7 months ago Stage 3b chronic kidney disease [facility])   [place], Vernia Buff, NP      Future Appointments            In 2 weeks Gildardo Pounds, NP [place]

## 2021-03-14 ENCOUNTER — Emergency Department (HOSPITAL_COMMUNITY): Payer: Medicare Other

## 2021-03-14 ENCOUNTER — Other Ambulatory Visit: Payer: Self-pay

## 2021-03-14 ENCOUNTER — Emergency Department (HOSPITAL_COMMUNITY)
Admission: EM | Admit: 2021-03-14 | Discharge: 2021-03-14 | Disposition: A | Discharge status: Home / Self Care | Payer: Medicare Other | Attending: Emergency Medicine | Admitting: Emergency Medicine

## 2021-03-14 ENCOUNTER — Encounter (HOSPITAL_COMMUNITY): Payer: Self-pay

## 2021-03-14 DIAGNOSIS — I10 Essential (primary) hypertension: Secondary | ICD-10-CM | POA: Diagnosis not present

## 2021-03-14 DIAGNOSIS — R509 Fever, unspecified: Secondary | ICD-10-CM | POA: Diagnosis not present

## 2021-03-14 DIAGNOSIS — Z471 Aftercare following joint replacement surgery: Secondary | ICD-10-CM | POA: Diagnosis not present

## 2021-03-14 DIAGNOSIS — Z7982 Long term (current) use of aspirin: Secondary | ICD-10-CM | POA: Diagnosis not present

## 2021-03-14 DIAGNOSIS — J189 Pneumonia, unspecified organism: Secondary | ICD-10-CM

## 2021-03-14 DIAGNOSIS — Z79899 Other long term (current) drug therapy: Secondary | ICD-10-CM | POA: Insufficient documentation

## 2021-03-14 DIAGNOSIS — M25512 Pain in left shoulder: Secondary | ICD-10-CM | POA: Insufficient documentation

## 2021-03-14 DIAGNOSIS — R6889 Other general symptoms and signs: Secondary | ICD-10-CM | POA: Diagnosis not present

## 2021-03-14 DIAGNOSIS — Z794 Long term (current) use of insulin: Secondary | ICD-10-CM | POA: Insufficient documentation

## 2021-03-14 DIAGNOSIS — R079 Chest pain, unspecified: Secondary | ICD-10-CM | POA: Diagnosis not present

## 2021-03-14 DIAGNOSIS — G8918 Other acute postprocedural pain: Secondary | ICD-10-CM | POA: Insufficient documentation

## 2021-03-14 DIAGNOSIS — E119 Type 2 diabetes mellitus without complications: Secondary | ICD-10-CM | POA: Diagnosis not present

## 2021-03-14 DIAGNOSIS — Z96642 Presence of left artificial hip joint: Secondary | ICD-10-CM | POA: Diagnosis not present

## 2021-03-14 DIAGNOSIS — M25519 Pain in unspecified shoulder: Secondary | ICD-10-CM | POA: Diagnosis not present

## 2021-03-14 DIAGNOSIS — I517 Cardiomegaly: Secondary | ICD-10-CM | POA: Diagnosis not present

## 2021-03-14 DIAGNOSIS — Z743 Need for continuous supervision: Secondary | ICD-10-CM | POA: Diagnosis not present

## 2021-03-14 DIAGNOSIS — I700 Atherosclerosis of aorta: Secondary | ICD-10-CM | POA: Diagnosis not present

## 2021-03-14 LAB — LACTIC ACID, PLASMA: Lactic Acid, Venous: 1.2 mmol/L (ref 0.5–1.9)

## 2021-03-14 LAB — CBC WITH DIFFERENTIAL/PLATELET
Abs Immature Granulocytes: 0.05 10*3/uL (ref 0.00–0.07)
Basophils Absolute: 0 10*3/uL (ref 0.0–0.1)
Basophils Relative: 0 %
Eosinophils Absolute: 0.2 10*3/uL (ref 0.0–0.5)
Eosinophils Relative: 2 %
HCT: 32.7 % — ABNORMAL LOW (ref 36.0–46.0)
Hemoglobin: 10.2 g/dL — ABNORMAL LOW (ref 12.0–15.0)
Immature Granulocytes: 1 %
Lymphocytes Relative: 16 %
Lymphs Abs: 1.5 10*3/uL (ref 0.7–4.0)
MCH: 29.7 pg (ref 26.0–34.0)
MCHC: 31.2 g/dL (ref 30.0–36.0)
MCV: 95.1 fL (ref 80.0–100.0)
Monocytes Absolute: 0.9 10*3/uL (ref 0.1–1.0)
Monocytes Relative: 10 %
Neutro Abs: 6.3 10*3/uL (ref 1.7–7.7)
Neutrophils Relative %: 71 %
Platelets: 268 10*3/uL (ref 150–400)
RBC: 3.44 MIL/uL — ABNORMAL LOW (ref 3.87–5.11)
RDW: 13.6 % (ref 11.5–15.5)
WBC: 8.9 10*3/uL (ref 4.0–10.5)
nRBC: 0 % (ref 0.0–0.2)

## 2021-03-14 LAB — BASIC METABOLIC PANEL
Anion gap: 9 (ref 5–15)
BUN: 26 mg/dL — ABNORMAL HIGH (ref 8–23)
CO2: 21 mmol/L — ABNORMAL LOW (ref 22–32)
Calcium: 8.9 mg/dL (ref 8.9–10.3)
Chloride: 107 mmol/L (ref 98–111)
Creatinine, Ser: 1.83 mg/dL — ABNORMAL HIGH (ref 0.44–1.00)
GFR, Estimated: 29 mL/min — ABNORMAL LOW (ref >60)
Glucose, Bld: 204 mg/dL — ABNORMAL HIGH (ref 70–99)
Potassium: 3.9 mmol/L (ref 3.5–5.1)
Sodium: 137 mmol/L (ref 135–145)

## 2021-03-14 MED ORDER — HYDROMORPHONE HCL 1 MG/ML IJ SOLN
0.5000 mg | INTRAMUSCULAR | Status: DC | PRN
  Administered 2021-03-14: 0.5 mg via INTRAVENOUS
  Filled 2021-03-14: qty 1

## 2021-03-14 MED ORDER — DOXYCYCLINE HYCLATE 100 MG PO TABS
100.0000 mg | ORAL_TABLET | Freq: Once | ORAL | Status: AC
  Administered 2021-03-14: 100 mg via ORAL
  Filled 2021-03-14: qty 1

## 2021-03-14 MED ORDER — OXYCODONE-ACETAMINOPHEN 5-325 MG PO TABS
1.0000 | ORAL_TABLET | Freq: Four times a day (QID) | ORAL | 0 refills | Status: DC | PRN
Start: 2021-03-14 — End: 2021-09-14

## 2021-03-14 MED ORDER — DOXYCYCLINE HYCLATE 100 MG PO TABS: 100.0000 mg | ORAL_TABLET | Freq: Two times a day (BID) | ORAL | 0 refills | Status: DC

## 2021-03-14 MED ORDER — IBUPROFEN 200 MG PO TABS
200.0000 mg | ORAL_TABLET | Freq: Once | ORAL | Status: DC
  Filled 2021-03-14: qty 1

## 2021-03-14 NOTE — ED Provider Triage Note (Signed)
Emergency Medicine Provider Triage Evaluation Note  [name] , a 71 y.o. female  was evaluated in triage.  Pt complains of left shoulder pain. Patient had left shoulder surgery on 2/2 by Dr. Onnie [name]. She notes continued pain. She states she had a fever of 101F prior to arrival.   Review of Systems  Positive: Arthralgia, fever Negative:   Physical Exam  Ht 4\' 11"  (1.499 m)    Wt 56.7 kg    LMP  (LMP Unknown) Comment: tubal ligation   SpO2 99%    BMI 25.25 kg/m  Gen:   Awake, no distress   Resp:  Normal effort  MSK:   Moves extremities without difficulty  Other:  Severe tenderness throughout left shoulder. Bandage on left shoulder   Medical Decision Making  Medically screening exam initiated at 4:49 PM.  Appropriate orders placed.  [name] was informed that the remainder of the evaluation will be completed by another provider, this initial triage assessment does not replace that evaluation, and the importance of remaining in the ED until their evaluation is complete.  Given fever, will obtain labs to rule out evidence of infection X-ray ordered   Dominique Bouchard, PA-C 03/14/21 1651

## 2021-03-14 NOTE — ED Notes (Signed)
Patients grandson would like a call back with an update: Carma Leaven [phone]

## 2021-03-14 NOTE — Discharge Instructions (Addendum)
The x-ray showed the possibility of a developing pneumonia.  Take the antibiotics as prescribed.  Follow-up with your orthopedic doctor to be rechecked.  Call the office tomorrow to schedule an appointment

## 2021-03-14 NOTE — ED Triage Notes (Signed)
"  Had surgery on left shoulder on 2/3 and is still having pain, the hydrocodone is not helping. Last dose at 1500" per pt

## 2021-03-14 NOTE — ED Provider Notes (Signed)
[facility] DEPT Provider Note   CSN: [id number] Arrival date & time: 03/14/21  1631     History  Chief Complaint  Patient presents with   Shoulder Pain    [name] is a 71 y.o. female.   Shoulder Pain  Patient presented to the ED for evaluation of left shoulder pain.  Patient has history of diabetes, hyperlipidemia, hypertension, myocardial infarction.  Patient had recent shoulder surgery.  Medical records reviewed and on February 2 patient had reverse shoulder arthroplasty.  Patient states she has been having significant pain since her surgery.  Today at home she developed a fever of 101.  Patient developed increasing pain.  She was unable to reach her doctor so she came to the ED for evaluation.  Home Medications Prior to Admission medications   Medication Sig Start Date End Date Taking? Authorizing Provider  doxycycline (VIBRA-TABS) 100 MG tablet Take 1 tablet (100 mg total) by mouth 2 (two) times daily. 03/14/21  Yes Dorie Rank, MD  oxyCODONE-acetaminophen (PERCOCET/ROXICET) 5-325 MG tablet Take 1 tablet by mouth every 6 (six) hours as needed for severe pain. 03/14/21  Yes Dorie Rank, MD  albuterol (PROVENTIL) (2.5 MG/3ML) 0.083% nebulizer solution Take 3 mLs (2.5 mg total) by nebulization every 6 (six) hours as needed for wheezing or shortness of breath. 11/15/20   Gildardo Pounds, NP  ALPRAZolam Duanne Moron) 0.25 MG tablet Take 0.25 mg by mouth at bedtime.    [provider]  amLODipine (NORVASC) 10 MG tablet Take 10 mg by mouth daily.    [provider]  anastrozole (ARIMIDEX) 1 MG tablet Take 1 tablet (1 mg total) by mouth daily. 09/13/20   Nicholas Lose, MD  aspirin 81 MG EC tablet Take 1 tablet (81 mg total) by mouth daily. 10/13/18   Pokhrel, Corrie Mckusick, MD  atorvastatin (LIPITOR) 20 MG tablet Take 20 mg by mouth daily.    [provider]  Blood Pressure Monitor DEVI Please provide patient with insurance approved blood  pressure monitor. I10.0 11/15/20   Gildardo Pounds, NP  budesonide-formoterol (SYMBICORT) 160-4.5 MCG/ACT inhaler Inhale 1 puff into the lungs 2 (two) times daily as needed (respiratory issues.). 03/15/20   Gildardo Pounds, NP  diclofenac Sodium (VOLTAREN) 1 % GEL Apply 4 g topically 4 (four) times daily. Patient not taking: Reported on 01/30/2021 09/09/20   Hans Eden, NP  HYDROcodone-acetaminophen (NORCO) 5-325 MG tablet Take 1 tablet by mouth every 6 (six) hours as needed (post op pain). 03/10/21   Shuford, Olivia Mackie, PA-C  insulin glargine (LANTUS SOLOSTAR) 100 UNIT/ML Solostar Pen Inject 5 Units into the skin at bedtime.    [provider]  Insulin Pen Needle (TRUEPLUS PEN NEEDLES) 31G X 8 MM MISC Inject into the skin once nightly.  E11.65 12/07/20   Gildardo Pounds, NP  losartan (COZAAR) 25 MG tablet Take 1 tablet (25 mg total) by mouth daily. 01/31/21   Gildardo Pounds, NP  metoprolol succinate (TOPROL-XL) 50 MG 24 hr tablet Take 50 mg by mouth daily. 01/23/21   [provider]  Misc. Devices MISC Please provide patient with nebulizer mask and tubing. [id number] 12/14/19   Gildardo Pounds, NP  Multiple Vitamin (MULTIVITAMIN WITH MINERALS) TABS tablet Take 1 tablet by mouth daily. [place]    [provider]  nitroGLYCERIN (NITROSTAT) 0.4 MG SL tablet Place 1 tablet (0.4 mg total) under the tongue every 5 (five) minutes as needed for chest pain. 03/15/20   Raul Del,  Vernia Buff, NP  Omega-3 Fatty Acids (FISH OIL) 1000 MG CAPS Take 1,000 mg by mouth daily.    [provider]  pantoprazole (PROTONIX) 40 MG tablet Take 40 mg by mouth daily.    [provider]  predniSONE (DELTASONE) 5 MG tablet Take 1 tablet (5 mg total) by mouth daily with breakfast. 12/12/20   Ladell Pier, MD  VICTOZA 18 MG/3ML SOPN INJECT 1.8 MG INTO THE SKIN DAILY 03/13/21   Gildardo Pounds, NP      Allergies    Patient has no known allergies.    Review of Systems   Review of  Systems  Physical Exam Updated Vital Signs BP (!) 181/72    Pulse 83    Temp 98.5 F (36.9 C) (Oral)    Resp 16    Ht 1.499 m (4\' 11" )    Wt 56.7 kg    LMP  (LMP Unknown) Comment: tubal ligation   SpO2 99%    BMI 25.25 kg/m  Physical Exam Vitals and nursing note reviewed.  Constitutional:      General: She is in acute distress.     Appearance: She is well-developed.     Comments: Tearful  HENT:     Head: Normocephalic and atraumatic.     Right Ear: External ear normal.     Left Ear: External ear normal.  Eyes:     General: No scleral icterus.       Right eye: No discharge.        Left eye: No discharge.     Conjunctiva/sclera: Conjunctivae normal.  Neck:     Trachea: No tracheal deviation.  Cardiovascular:     Rate and Rhythm: Normal rate and regular rhythm.  Pulmonary:     Effort: Pulmonary effort is normal. No respiratory distress.     Breath sounds: Normal breath sounds. No stridor. No wheezing or rales.  Abdominal:     General: Bowel sounds are normal. There is no distension.     Palpations: Abdomen is soft.     Tenderness: There is no abdominal tenderness. There is no guarding or rebound.  Musculoskeletal:        General: Tenderness present. No deformity.     Cervical back: Neck supple.     Comments: Shoulder dressing in place, pain with range of motion, no erythema or drainage noted  Skin:    General: Skin is warm and dry.     Findings: No rash.  Neurological:     General: No focal deficit present.     Mental Status: She is alert.     Cranial Nerves: No cranial nerve deficit (no facial droop, extraocular movements intact, no slurred speech).     Sensory: No sensory deficit.     Motor: No abnormal muscle tone or seizure activity.     Coordination: Coordination normal.  Psychiatric:        Mood and Affect: Mood normal.    ED Results / Procedures / Treatments   Labs (all labs ordered are listed, but only abnormal results are displayed) Labs Reviewed  CBC WITH  DIFFERENTIAL/PLATELET - Abnormal; Notable for the following components:      Result Value   RBC 3.44 (*)    Hemoglobin 10.2 (*)    HCT 32.7 (*)    All other components within normal limits  BASIC METABOLIC PANEL - Abnormal; Notable for the following components:   CO2 21 (*)    Glucose, Bld 204 (*)  BUN 26 (*)    Creatinine, Ser 1.83 (*)    GFR, Estimated 29 (*)    All other components within normal limits  LACTIC ACID, PLASMA    EKG None  Radiology DG Chest 2 View  Result Date: 03/14/2021 CLINICAL DATA:  Fever and pain. Left shoulder replacement 4 days ago. EXAM: CHEST - 2 VIEW COMPARISON:  AP Lat exam 12/08/2018. FINDINGS: Interval removal prior left chest port and IJ approach catheter. Stable mediastinum. Mild aortic uncoiling is noted with heavy aortic calcification. The heart is slightly enlarged. Central vessels are normal caliber. The lungs are hypoexpanded, but on the lateral view there is suspected increased opacity in the posterior basal left lower lobe concerning for pneumonia or aspiration, otherwise could be asymmetric atelectasis. The remaining hypoexpanded lungs are clear. No pleural effusion is seen. Interval left shoulder replacement with reverse arthroplasty. 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 IMPRESSION: 1. Suspected increased opacity in the left posterior base, concerning for pneumonia or aspiration, alternatively could be due to low lung volumes. 2. Aortic atherosclerosis. 3. Slight cardiomegaly. 4. Left shoulder replacement. Electronically Signed   By: Telford Nab M.D.   On: 03/14/2021 22:05   DG Shoulder Left  Result Date: 03/14/2021 CLINICAL DATA:  Shoulder pain.  Recent surgery 03/10/2021. EXAM: LEFT SHOULDER - 2+ VIEW COMPARISON:  09/09/2020 FINDINGS: Left reverse arthroplasty noted with expected orientation. No periprosthetic fracture or other early complicating feature is identified. AC joint alignment normal. Dense atherosclerotic  calcification of the thoracic aorta noted. I do not see any abnormal gas tracking in the soft tissues. IMPRESSION: 1. Left reverse shoulder arthroplasty is observed with expected alignment and no early complicating features identified. Electronically Signed   By: Van Clines M.D.   On: 03/14/2021 17:30    Procedures Procedures    Medications Ordered in ED Medications  HYDROmorphone (DILAUDID) injection 0.5 mg (0.5 mg Intravenous Given 03/14/21 2134)  ibuprofen (ADVIL) tablet 200 mg (200 mg Oral Not Given 03/14/21 2206)  doxycycline (VIBRA-TABS) tablet 100 mg (has no administration in time range)    ED Course/ Medical Decision Making/ A&P Clinical Course as of 03/14/21 2303  Tue Mar 14, 2021  2118 Shoulder radiology report and images reviewed.  No acute findings noted.   [JK]  2119 CBC with Differential(!) [JK]  2119 No leukocytosis [JK]  5916 Basic metabolic panel(!) Metabolic panel normal. [JK]  2119 Lactic acid, plasma Lactic acid normal. [JK]  2139 Discussed with Dr. Lyla Glassing.  We will give low-dose of NSAID.  Will need to be careful with her renal dysfunction.  We will disorder 1 , 200 mg dose.  We will get a chest x-ray to make sure no signs of pneumonia.  Additional pain medicines ordered [JK]  2249 Chest x-ray suggest the possibility of pneumonia although could be atelectasis [JK]    Clinical Course User Index [JK] Dorie Rank, MD                           Medical Decision Making Amount and/or Complexity of Data Reviewed Labs:  Decision-making details documented in ED Course. Radiology: ordered.  Risk OTC drugs. Prescription drug management.   Patient presented to the ED for evaluation of shoulder pain after recent orthopedic surgery.  Patient is also concerned about fevers at home.  ED work-up is reassuring.  No signs of lactic acidosis.  Patient is afebrile.  CBC and metabolic panel unremarkable.  X-ray suggest the possibility  of pneumonia versus atelectasis.   Patient states she has been coughing.  We will go ahead and start on a course of antibiotics.  Patient was given IV narcotic pain medication.  She is feeling much better at this time point.  I discussed case with orthopedics and will plan on close outpatient orthopedic follow-up.        Final Clinical Impression(s) / ED Diagnoses Final diagnoses:  Post-operative pain  Community acquired pneumonia, unspecified laterality    Rx / DC Orders ED Discharge Orders          Ordered    doxycycline (VIBRA-TABS) 100 MG tablet  2 times daily        03/14/21 2300    oxyCODONE-acetaminophen (PERCOCET/ROXICET) 5-325 MG tablet  Every 6 hours PRN        03/14/21 2300              Dorie Rank, MD 03/14/21 2303

## 2021-03-17 ENCOUNTER — Other Ambulatory Visit: Payer: Self-pay

## 2021-03-17 ENCOUNTER — Inpatient Hospital Stay: Payer: Medicare Other | Attending: Hematology and Oncology

## 2021-03-17 DIAGNOSIS — Z79899 Other long term (current) drug therapy: Secondary | ICD-10-CM | POA: Insufficient documentation

## 2021-03-17 DIAGNOSIS — D472 Monoclonal gammopathy: Secondary | ICD-10-CM | POA: Insufficient documentation

## 2021-03-17 DIAGNOSIS — C50411 Malignant neoplasm of upper-outer quadrant of right female breast: Secondary | ICD-10-CM | POA: Insufficient documentation

## 2021-03-17 DIAGNOSIS — D631 Anemia in chronic kidney disease: Secondary | ICD-10-CM | POA: Diagnosis not present

## 2021-03-17 DIAGNOSIS — Z170 Estrogen receptor positive status [ER+]: Secondary | ICD-10-CM | POA: Insufficient documentation

## 2021-03-17 DIAGNOSIS — Z79811 Long term (current) use of aromatase inhibitors: Secondary | ICD-10-CM | POA: Diagnosis not present

## 2021-03-17 DIAGNOSIS — N189 Chronic kidney disease, unspecified: Secondary | ICD-10-CM | POA: Insufficient documentation

## 2021-03-17 LAB — CMP (CANCER CENTER ONLY)
ALT: 37 U/L (ref 0–44)
AST: 29 U/L (ref 15–41)
Albumin: 3.8 g/dL (ref 3.5–5.0)
Alkaline Phosphatase: 214 U/L — ABNORMAL HIGH (ref 38–126)
Anion gap: 9 (ref 5–15)
BUN: 26 mg/dL — ABNORMAL HIGH (ref 8–23)
CO2: 24 mmol/L (ref 22–32)
Calcium: 9.9 mg/dL (ref 8.9–10.3)
Chloride: 105 mmol/L (ref 98–111)
Creatinine: 1.95 mg/dL — ABNORMAL HIGH (ref 0.44–1.00)
GFR, Estimated: 27 mL/min — ABNORMAL LOW (ref >60)
Glucose, Bld: 75 mg/dL (ref 70–99)
Potassium: 3.9 mmol/L (ref 3.5–5.1)
Sodium: 138 mmol/L (ref 135–145)
Total Bilirubin: 0.7 mg/dL (ref 0.3–1.2)
Total Protein: 8.6 g/dL — ABNORMAL HIGH (ref 6.5–8.1)

## 2021-03-17 LAB — CBC WITH DIFFERENTIAL (CANCER CENTER ONLY)
Abs Immature Granulocytes: 0.18 10*3/uL — ABNORMAL HIGH (ref 0.00–0.07)
Basophils Absolute: 0 10*3/uL (ref 0.0–0.1)
Basophils Relative: 0 %
Eosinophils Absolute: 0.3 10*3/uL (ref 0.0–0.5)
Eosinophils Relative: 2 %
HCT: 31.3 % — ABNORMAL LOW (ref 36.0–46.0)
Hemoglobin: 10 g/dL — ABNORMAL LOW (ref 12.0–15.0)
Immature Granulocytes: 2 %
Lymphocytes Relative: 19 %
Lymphs Abs: 2.1 10*3/uL (ref 0.7–4.0)
MCH: 29.9 pg (ref 26.0–34.0)
MCHC: 31.9 g/dL (ref 30.0–36.0)
MCV: 93.4 fL (ref 80.0–100.0)
Monocytes Absolute: 1.1 10*3/uL — ABNORMAL HIGH (ref 0.1–1.0)
Monocytes Relative: 10 %
Neutro Abs: 7.5 10*3/uL (ref 1.7–7.7)
Neutrophils Relative %: 67 %
Platelet Count: 367 10*3/uL (ref 150–400)
RBC: 3.35 MIL/uL — ABNORMAL LOW (ref 3.87–5.11)
RDW: 13.4 % (ref 11.5–15.5)
WBC Count: 11.2 10*3/uL — ABNORMAL HIGH (ref 4.0–10.5)
nRBC: 0 % (ref 0.0–0.2)

## 2021-03-20 LAB — KAPPA/LAMBDA LIGHT CHAINS
Kappa free light chain: 195.2 mg/L — ABNORMAL HIGH (ref 3.3–19.4)
Kappa, lambda light chain ratio: 7.72 — ABNORMAL HIGH (ref 0.26–1.65)
Lambda free light chains: 25.3 mg/L (ref 5.7–26.3)

## 2021-03-21 LAB — MULTIPLE MYELOMA PANEL, SERUM
Albumin SerPl Elph-Mcnc: 3.3 g/dL (ref 2.9–4.4)
Albumin/Glob SerPl: 0.8 (ref 0.7–1.7)
Alpha 1: 0.5 g/dL — ABNORMAL HIGH (ref 0.0–0.4)
Alpha2 Glob SerPl Elph-Mcnc: 1.3 g/dL — ABNORMAL HIGH (ref 0.4–1.0)
B-Globulin SerPl Elph-Mcnc: 1 g/dL (ref 0.7–1.3)
Gamma Glob SerPl Elph-Mcnc: 1.9 g/dL — ABNORMAL HIGH (ref 0.4–1.8)
Globulin, Total: 4.6 g/dL — ABNORMAL HIGH (ref 2.2–3.9)
IgA: 90 mg/dL (ref 64–422)
IgG (Immunoglobin G), Serum: 2262 mg/dL — ABNORMAL HIGH (ref 586–1602)
IgM (Immunoglobulin M), Srm: 12 mg/dL — ABNORMAL LOW (ref 26–217)
M Protein SerPl Elph-Mcnc: 1.3 g/dL — ABNORMAL HIGH
Total Protein ELP: 7.9 g/dL (ref 6.0–8.5)

## 2021-03-22 DIAGNOSIS — Z96612 Presence of left artificial shoulder joint: Secondary | ICD-10-CM | POA: Insufficient documentation

## 2021-03-22 IMAGING — DX PORTABLE CHEST - 1 VIEW
1 series · 1 of 1 positions shown · non-contrast
Comparison: May 07, 2018

CLINICAL DATA: Chest pain

EXAM:
PORTABLE CHEST 1 VIEW

[chest]
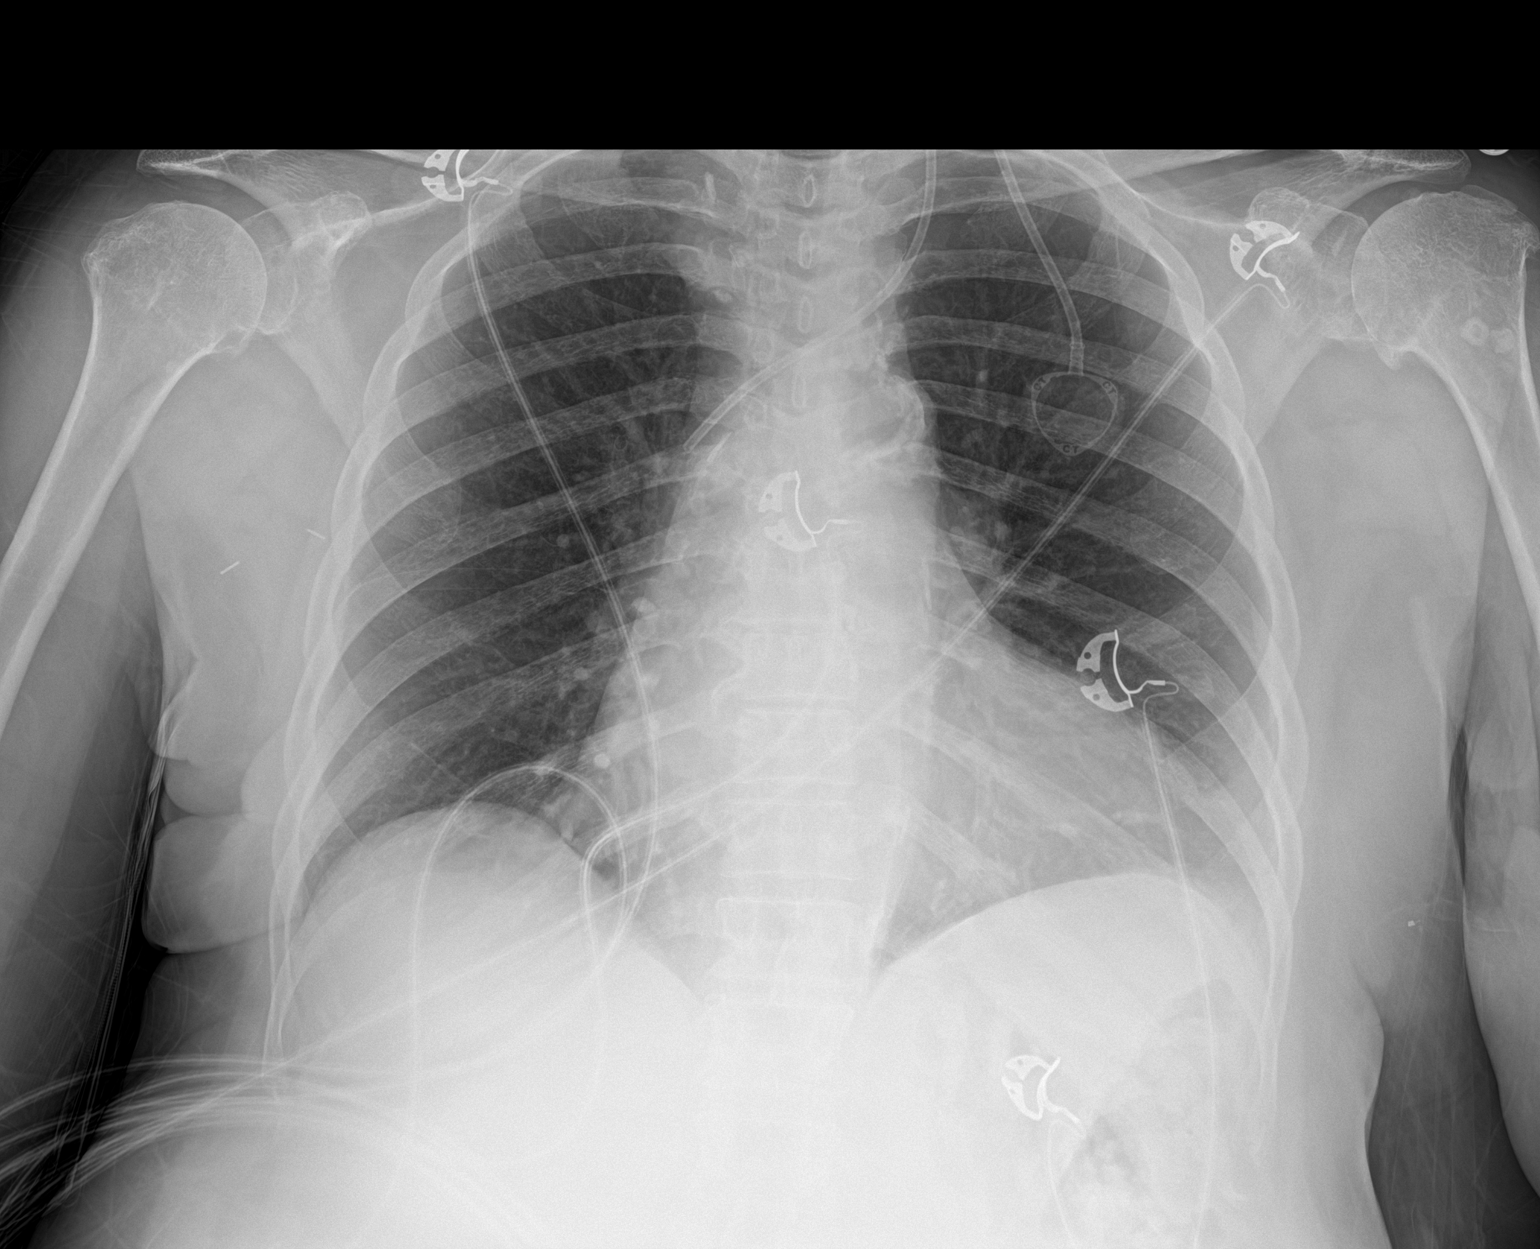

[1 of 1 positions shown; findings below may reference images not displayed]

FINDINGS: Port-A-Cath tip is at the junction of the left innominate vein and
superior vena cava. No pneumothorax. There is a persistent small
left pleural effusion with mild left base atelectasis. Lungs
elsewhere are clear. Heart is mildly enlarged with pulmonary
vascularity normal. There is aortic atherosclerosis. No adenopathy.
There is degenerative change in each shoulder. Surgical clips are
noted in the right axillary region.
IMPRESSION: Small left pleural effusion with mild left base atelectasis. No
consolidation. Stable cardiomegaly. Port-A-Cath tip at junction of
superior vena cava and left innominate vein. Aortic Atherosclerosis
([id number]).

## 2021-03-23 NOTE — Progress Notes (Incomplete)
HEMATOLOGY-ONCOLOGY TELEPHONE VISIT PROGRESS NOTE  I connected with [name] on 03/24/2021 at 11:45 AM EST by telephone and verified that I am speaking with the correct person using two identifiers.  I discussed the limitations, risks, security and privacy concerns of performing an evaluation and management service by telephone and the availability of in person appointments.  I also discussed with the patient that there may be a patient responsible charge related to this service. The patient expressed understanding and agreed to proceed.   History of Present Illness: [name] is a 71 y.o. 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 Mammogram on 02/09/2021 showed no evidence of malignancy. She presents via telephone today for follow-up.   Oncology History  Malignant neoplasm of upper-outer quadrant of right breast in female, estrogen receptor positive ([place])  09/03/2017 Initial Diagnosis   Screening mammogram detected 1.4 cm right breast mass at 10 o'clock position, additional pleomorphic calcifications 2 cm posteriorly, additional loosely grouped calcifications 10.1 x 7.1 x 5.3 cm, single right axillary lymph node: Biopsy revealed DCIS ER 100%, PR 50%; IDC grade 2-3 ER 100%, PR 50%, Ki-67 15%, HER-2 positive ratio 2, copy #5, lymph node negative T2 N0 stage Ia clinical stage AJCC 8   09/03/2017 Cancer Staging   Staging form: Breast, AJCC 8th Edition - Clinical stage from 09/03/2017: Stage IB (cT2, cN0, cM0, G3, ER+, PR+, HER2+)   11/27/2017 Surgery   Right mastectomy ([place]) ([id number]): IDC grade 2, 1.6 cm, separate foci of DCIS intermediate grade, margins negative, 1/5 LN positive for micrometasatic carcinoma. ER 100%, PR 50%, Ki-67 15%, HER-2 positive ratio 2, copy #5 T1c N0 stage Ia   11/27/2017 Cancer Staging   Staging form: Breast, AJCC 8th Edition - Pathologic stage from 11/27/2017: Stage IA (pT1c, pN0, cM0, G2, ER+, PR+, HER2+)     02/07/2018 -  03/12/2019 Chemotherapy   trastuzumab (HERCEPTIN) 483 mg in sodium chloride 0.9 % 250 mL chemo infusion, 8 mg/kg = 483 mg, Intravenous,  Once, 11 of 11 cycles. Administration: 483 mg (02/07/2018), 357 mg (02/27/2018), 357 mg (05/01/2018), 357 mg (05/23/2018), 357 mg (06/12/2018), 357 mg (07/03/2018), 357 mg (08/14/2018), 357 mg (09/04/2018), 357 mg (10/17/2018), 357 mg (03/20/2018), 357 mg (04/10/2018)  trastuzumab-anns (KANJINTI) 357 mg in sodium chloride 0.9 % 250 mL chemo infusion, 6 mg/kg = 357 mg (100 % of original dose 6 mg/kg), Intravenous,  Once, 7 of 7 cycles. Dose modification: 6 mg/kg (original dose 6 mg/kg, Cycle 12, Reason: Other (see comments)). Administration: 357 mg (11/06/2018), 357 mg (11/27/2018), 357 mg (12/18/2018), 357 mg (01/08/2019), 357 mg (01/29/2019), 357 mg (02/19/2019), 357 mg (03/12/2019).   12/2018 - 12/2023 Anti-estrogen oral therapy   Anastrozole     Observations/Objective:     Assessment Plan:  Malignant neoplasm of upper-outer quadrant of right breast in female, estrogen receptor positive ([place]) Lab review: 11/14/2020: M spike: 1.9 g, IgG 2884, immunofixation shows IgG kappa Creatinine: 2.38 (baseline creatinine 1.6), calcium 9.7, albumin 4.5, hemoglobin 13.1 11/29/2020: M spike 1.6 g IgG kappa,Kappa lambda ratio: 9 (Kappa 136.9, lambda 15.2),beta-2 microglobulin: 3.4, creatinine 2.3, hemoglobin 12 12/09/2020: Bone survey: No lytic lesions   Differential diagnosis: MGUS versus myeloma There is no evidence of severe anemia or hypercalcemia or lytic lesions.  Therefore it is most likely MGUS Acute renal failure could be related to underlying cardiac issues.     History of breast cancer: Currently on anastrozole therapy and doing well.    I discussed the assessment and treatment  plan with the patient. The patient was provided an opportunity to ask questions and all were answered. The patient agreed with the plan and demonstrated an understanding of the instructions. The patient  was advised to call back or seek an in-person evaluation if the symptoms worsen or if the condition fails to improve as anticipated.   Total time spent: *** mins including non-face to face time and time spent for planning, charting and coordination of care  Rulon Eisenmenger, MD 03/24/2021    I, Thana Ates, am acting as scribe for Nicholas Lose, MD.  {Add scribe attestation statement}

## 2021-03-24 ENCOUNTER — Telehealth: Payer: Medicaid Other | Admitting: Hematology and Oncology

## 2021-03-24 ENCOUNTER — Other Ambulatory Visit: Payer: Self-pay

## 2021-03-24 DIAGNOSIS — Z4789 Encounter for other orthopedic aftercare: Secondary | ICD-10-CM | POA: Diagnosis not present

## 2021-03-24 DIAGNOSIS — C50411 Malignant neoplasm of upper-outer quadrant of right female breast: Secondary | ICD-10-CM

## 2021-03-24 NOTE — Assessment & Plan Note (Signed)
Lab review: 11/14/2020: M spike: 1.9 g, IgG 2884, immunofixation shows IgG kappa Creatinine: 2.38 (baseline creatinine 1.6), calcium 9.7, albumin 4.5, hemoglobin 13.1 11/29/2020: M spike 1.6 g IgG kappa,Kappa lambda ratio: 9 (Kappa 136.9, lambda 15.2),beta-2 microglobulin: 3.4, creatinine 2.3, hemoglobin 12 12/09/2020: Bone survey: No lytic lesions  Differential diagnosis: MGUS versus myeloma There is no evidence of severe anemia or hypercalcemia or lytic lesions.  Therefore it is most likely MGUS Acute renal failure could be related to underlying cardiac issues.   History of breast cancer: Currently on anastrozole therapy and doing well.

## 2021-03-28 ENCOUNTER — Inpatient Hospital Stay: Payer: Medicare Other

## 2021-03-29 ENCOUNTER — Ambulatory Visit: Payer: Medicare Other | Attending: Nurse Practitioner | Admitting: Nurse Practitioner

## 2021-03-29 ENCOUNTER — Other Ambulatory Visit: Payer: Self-pay

## 2021-03-29 ENCOUNTER — Encounter: Payer: Self-pay | Admitting: Nurse Practitioner

## 2021-03-29 VITALS — BP 179/79 | HR 85 | Resp 18 | Ht 59.0 in | Wt 126.0 lb

## 2021-03-29 DIAGNOSIS — I10 Essential (primary) hypertension: Secondary | ICD-10-CM | POA: Diagnosis not present

## 2021-03-29 DIAGNOSIS — F331 Major depressive disorder, recurrent, moderate: Secondary | ICD-10-CM

## 2021-03-29 DIAGNOSIS — F17200 Nicotine dependence, unspecified, uncomplicated: Secondary | ICD-10-CM

## 2021-03-29 DIAGNOSIS — Z23 Encounter for immunization: Secondary | ICD-10-CM | POA: Diagnosis not present

## 2021-03-29 MED ORDER — ESCITALOPRAM OXALATE 10 MG PO TABS: 10.0000 mg | ORAL_TABLET | Freq: Every day | ORAL | 2 refills | Status: DC

## 2021-03-29 MED ORDER — LOSARTAN POTASSIUM 50 MG PO TABS: 50.0000 mg | ORAL_TABLET | Freq: Every day | ORAL | 0 refills | Status: DC

## 2021-03-29 NOTE — Progress Notes (Signed)
Assessment & Plan:  [name] was seen today for hypertension.  Diagnoses and all orders for this visit:  Essential hypertension -     losartan (COZAAR) 50 MG tablet; Take 1 tablet (50 mg total) by mouth daily.  Need for pneumococcal vaccination -     Pneumococcal conjugate vaccine 20-valent  Tobacco dependence -     CT CHEST LUNG CA SCREEN LOW DOSE W/O CM; Future  Moderate episode of recurrent major depressive disorder (HCC) -     escitalopram (LEXAPRO) 10 MG tablet; Take 1 tablet (10 mg total) by mouth daily. DEPRESSION    Patient has been counseled on age-appropriate routine health concerns for screening and prevention. These are reviewed and up-to-date. Referrals have been placed accordingly. Immunizations are up-to-date or declined.    Subjective:   Chief Complaint  Patient presents with   Hypertension   HPI [name] 71 y.o. female presents to office today for follow-up to hypertension.  LUNG CANCER Screeing Smoker for >50 years. Smokes about 3-4 cigarettes per day.  Blood pressure very elevated today.  I am increasing her losartan to 50 mg.  We will need to recheck kidney function in 5 weeks and if elevated will need to decrease back to 25 g and add another blood pressure agent.  I did ask her to bring in all her medications next visit so that we can confirm medication adherence.   She is very upset today regarding her recent blood work hematology.  I did instruct her that her blood levels are elevated but could not give her any diagnosis aside from there appears to be some type of blood disorder that needs to be discussed with oncology.  BP Readings from Last 3 Encounters:  03/29/21 (!) 179/79  03/14/21 (!) 168/71  03/10/21 (!) 160/66    Lab Results  Component Value Date   HGBA1C 5.9 (H) 02/08/2021    Lab Results  Component Value Date   LDLCALC 51 03/15/2020    Tobacco dependence CT for lung cancer screening ordered today she has been a smoker for  greater than 50 years.  Has smoked up to a pack per day but now only smokes 1 to 2 cigarettes a day.   LEFT SHOULDER PAIN Status post reverse left shoulder arthroplasty.  She will begin physical therapy in a few weeks.  Review of Systems  Constitutional:  Negative for fever, malaise/fatigue and weight loss.  HENT: Negative.  Negative for nosebleeds.   Eyes: Negative.  Negative for blurred vision, double vision and photophobia.  Respiratory: Negative.  Negative for cough and shortness of breath.   Cardiovascular: Negative.  Negative for chest pain, palpitations and leg swelling.  Gastrointestinal: Negative.  Negative for heartburn, nausea and vomiting.  Musculoskeletal:  Positive for joint pain. Negative for myalgias.  Neurological: Negative.  Negative for dizziness, focal weakness, seizures and headaches.  Psychiatric/Behavioral:  Positive for depression. Negative for suicidal ideas.    Past Medical History:  Diagnosis Date   Ambulates with cane    straight   Angina    no current problems per patient at PAT appt 11/05/18, nitroglycerin sl 06/30/20   Arthritis    HANDS"   Asthma    Cancer ([place]) 09/04/2017   Right Breast   Carpal tunnel syndrome, bilateral 11/26/2016   Cholelithiasis    Cocaine abuse ([place]) 07/2012   per E.R. drug screen   COPD (chronic obstructive pulmonary disease) ([place])    per patient   Coronary artery disease  Diabetes mellitus without mention of complication    type 2   Dysphagia    esophageal dysmotility on 03/2011 esophagram    Fatty liver disease, nonalcoholic 2130   on Imaging.    GERD (gastroesophageal reflux disease)    Headache    Hearing loss    bilateral - no hearing aids   History of colonic polyps 2009   adenomatous 2009, HP in 2009, 2011, 2013.    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   Wears dentures    full  upper and partial lower   Wears glasses     Past Surgical History:  Procedure Laterality Date   ABDOMINAL ANGIOGRAM  02/20/2011   Procedure: ABDOMINAL ANGIOGRAM;  Surgeon: Clent Demark, MD;  Location: [facility] CATH LAB;  Service: Cardiovascular;;   BREAST BIOPSY Left    CARPAL TUNNEL RELEASE Right 07/04/2017   Procedure: RIGHT CARPAL TUNNEL RELEASE;  Surgeon: Leanora Cover, MD;  Location: [place];  Service: Orthopedics;  Laterality: Right;   CARPAL TUNNEL RELEASE Left 09/12/2017   Procedure: LEFT CARPAL TUNNEL RELEASE;  Surgeon: Leanora Cover, MD;  Location: [place];  Service: Orthopedics;  Laterality: Left;   CHOLECYSTECTOMY N/A 11/11/2018   Procedure: ATTEMPTED LAPAROSCOPIC CHOLECUSTECOMY,  OPEN CHOLECYSTECTOMY;  Surgeon: Coralie Keens, MD;  Location: [place];  Service: General;  Laterality: N/A;   COLONOSCOPY  11/30/2011   Procedure: COLONOSCOPY;  Surgeon: Lafayette Dragon, MD;  Location: [facility] ENDOS[facility];  Service: Endoscopy;  Laterality: N/A;   CORONARY ANGIOPLASTY WITH STENT PLACEMENT  02/20/2011   ERCP N/A 10/03/2018   Procedure: ENDOSCOPIC RETROGRADE CHOLANGIOPANCREATOGRAPHY (ERCP);  Surgeon: Ladene Artist, MD;  Location: [facility];  Service: Endoscopy;  Laterality: N/A;   ESOPHAGOGASTRODUODENOSCOPY  11/30/2011   Procedure: ESOPHAGOGASTRODUODENOSCOPY (EGD);  Surgeon: Lafayette Dragon, MD;  Location: [facility];  Service: Endoscopy;  Laterality: N/A;   ESOPHAGOGASTRODUODENOSCOPY N/A 02/23/2015   Procedure: ESOPHAGOGASTRODUODENOSCOPY (EGD);  Surgeon: Gatha Mayer, MD;  Location: [facility] ENDOSCOPY;  Service: Endoscopy;  Laterality: N/A;   EYE SURGERY Bilateral    cataracts removed   INTRAOPERATIVE CHOLANGIOGRAM N/A 11/11/2018   Procedure: Intraoperative Cholangiogram;  Surgeon: Coralie Keens, MD;  Location: [place];  Service: General;  Laterality: N/A;   IR REMOVAL TUN ACCESS W/ PORT W/O FL MOD SED  06/30/2020   LEFT HEART CATHETERIZATION WITH CORONARY ANGIOGRAM  N/A 02/20/2011   Procedure: LEFT HEART CATHETERIZATION WITH CORONARY ANGIOGRAM;  Surgeon: Clent Demark, MD;  Location: [place] CATH LAB;  Service: Cardiovascular;  Laterality: N/A;   MASTECTOMY Right 11/05/2017   MASTECTOMY W/ SENTINEL NODE BIOPSY Right 11/27/2017   MASTECTOMY W/ SENTINEL NODE BIOPSY Right 11/27/2017   Procedure: RIGHT TOTAL MASTECTOMY WITH SENTINEL LYMPH NODE BIOPSY;  Surgeon: Excell Seltzer, MD;  Location: [place];  Service: General;  Laterality: Right;   PORT A CATH REVISION Left 05/07/2018   Procedure: PORT A CATH REVISION;  Surgeon: Excell Seltzer, MD;  Location: [facility];  Service: General;  Laterality: Left;   PORTACATH PLACEMENT Left 11/27/2017   Procedure: INSERTION PORT-A-CATH;  Surgeon: Excell Seltzer, MD;  Location: [place];  Service: General;  Laterality: Left;   REVERSE SHOULDER ARTHROPLASTY Left 03/09/2021   Procedure: REVERSE SHOULDER ARTHROPLASTY;  Surgeon: Justice Britain, MD;  Location: [facility];  Service: Orthopedics;  Laterality: Left;   RIGHT COLECTOMY  02/2007  for post polypectomy colonic perforation   SPHINCTEROTOMY  10/03/2018   Procedure: SPHINCTEROTOMY;  Surgeon: Ladene Artist, MD;  Location: [facility];  Service: Endoscopy;;   TUBAL LIGATION      Family History  Problem Relation Age of Onset   Heart disease Mother    Diabetes Mother    Cancer Father        unsure what kind   Diabetes Sister    Colon cancer Neg Hx    Breast cancer Neg Hx     Social History Reviewed with no changes to be made today.   Outpatient Medications Prior to Visit  Medication Sig Dispense Refill   albuterol (PROVENTIL) (2.5 MG/3ML) 0.083% nebulizer solution Take 3 mLs (2.5 mg total) by nebulization every 6 (six) hours as needed for wheezing or shortness of breath. 75 mL 3   ALPRAZolam (XANAX) 0.25 MG tablet Take 0.25 mg by mouth at bedtime.     amLODipine (NORVASC) 10 MG tablet Take 10 mg by mouth daily.     anastrozole (ARIMIDEX) 1 MG tablet Take 1 tablet (1 mg  total) by mouth daily. 90 tablet 3   aspirin 81 MG EC tablet Take 1 tablet (81 mg total) by mouth daily. 30 tablet 0   atorvastatin (LIPITOR) 20 MG tablet Take 20 mg by mouth daily.     Blood Pressure Monitor DEVI Please provide patient with insurance approved blood pressure monitor. I10.0 1 each 0   budesonide-formoterol (SYMBICORT) 160-4.5 MCG/ACT inhaler Inhale 1 puff into the lungs 2 (two) times daily as needed (respiratory issues.). 1 each 6   diclofenac Sodium (VOLTAREN) 1 % GEL Apply 4 g topically 4 (four) times daily. 100 g 1   doxycycline (VIBRA-TABS) 100 MG tablet Take 1 tablet (100 mg total) by mouth 2 (two) times daily. 14 tablet 0   HYDROcodone-acetaminophen (NORCO) 5-325 MG tablet Take 1 tablet by mouth every 6 (six) hours as needed (post op pain). 20 tablet 0   insulin glargine (LANTUS SOLOSTAR) 100 UNIT/ML Solostar Pen Inject 5 Units into the skin at bedtime.     Insulin Pen Needle (TRUEPLUS PEN NEEDLES) 31G X 8 MM MISC Inject into the skin once nightly.  E11.65 100 each 1   metoprolol succinate (TOPROL-XL) 50 MG 24 hr tablet Take 50 mg by mouth daily.     Misc. Devices MISC Please provide patient with nebulizer mask and tubing. ICD-10J44.9 1 each 0   Multiple Vitamin (MULTIVITAMIN WITH MINERALS) TABS tablet Take 1 tablet by mouth daily. [place]     nitroGLYCERIN (NITROSTAT) 0.4 MG SL tablet Place 1 tablet (0.4 mg total) under the tongue every 5 (five) minutes as needed for chest pain. 10 tablet 0   Omega-3 Fatty Acids (FISH OIL) 1000 MG CAPS Take 1,000 mg by mouth daily.     oxyCODONE-acetaminophen (PERCOCET/ROXICET) 5-325 MG tablet Take 1 tablet by mouth every 6 (six) hours as needed for severe pain. 15 tablet 0   pantoprazole (PROTONIX) 40 MG tablet Take 40 mg by mouth daily.     VICTOZA 18 MG/3ML SOPN INJECT 1.8 MG INTO THE SKIN DAILY 9 mL 2   losartan (COZAAR) 25 MG tablet Take 1 tablet (25 mg total) by mouth daily. 90 tablet 0   predniSONE (DELTASONE) 5 MG tablet Take 1 tablet  (5 mg total) by mouth daily with breakfast. 3 tablet 0   No facility-administered medications prior to visit.    No Known Allergies     Objective:  BP (!) 179/79    Pulse 85    Resp 18    Ht 4\' 11"  (1.499 m)    Wt 126 lb (57.2 kg)    LMP  (LMP Unknown) Comment: tubal ligation   SpO2 100%    BMI 25.45 kg/m  Wt Readings from Last 3 Encounters:  03/29/21 126 lb (57.2 kg)  03/14/21 125 lb (56.7 kg)  03/09/21 125 lb (56.7 kg)    Physical Exam Vitals and nursing note reviewed.  Constitutional:      Appearance: She is well-developed.  HENT:     Head: Normocephalic and atraumatic.  Cardiovascular:     Rate and Rhythm: Normal rate and regular rhythm.     Heart sounds: Normal heart sounds. No murmur heard.   No friction rub. No gallop.  Pulmonary:     Effort: Pulmonary effort is normal. No tachypnea or respiratory distress.     Breath sounds: Normal breath sounds. No decreased breath sounds, wheezing, rhonchi or rales.  Chest:     Chest wall: No tenderness.  Abdominal:     General: Bowel sounds are normal.     Palpations: Abdomen is soft.  Musculoskeletal:        General: Normal range of motion.     Cervical back: Normal range of motion.  Skin:    General: Skin is warm and dry.  Neurological:     Mental Status: She is alert and oriented to person, place, and time.     Coordination: Coordination normal.  Psychiatric:        Behavior: Behavior normal. Behavior is cooperative.        Thought Content: Thought content normal.        Judgment: Judgment normal.         Patient has been counseled extensively about nutrition and exercise as well as the importance of adherence with medications and regular follow-up. The patient was given clear instructions to go to ER or return to medical center if symptoms don't improve, worsen or new problems develop. The patient verbalized understanding.   Follow-up: Return in about 2 weeks (around 04/12/2021) for BP CHECK with luke and labs  BMP. Make sure she brings her meds. SEE ME IN 6 weeks. Gildardo Pounds, FNP-BC [facility] and [facility] [place], [place]   03/29/2021, 12:37 PM

## 2021-03-31 ENCOUNTER — Other Ambulatory Visit: Payer: Self-pay | Admitting: Family Medicine

## 2021-03-31 DIAGNOSIS — E119 Type 2 diabetes mellitus without complications: Secondary | ICD-10-CM

## 2021-03-31 NOTE — Telephone Encounter (Signed)
Requested medications are due for refill today.  Unsure  Requested medications are on the active medications list.  yes  Last refill. 01/30/2021  Future visit scheduled.   yes  Notes to clinic.  Medication listed as historical - historical provider.    Requested Prescriptions  Pending Prescriptions Disp Refills   LANTUS SOLOSTAR 100 UNIT/ML Solostar Pen [Pharmacy Med Name: Lantus Solostar U-100 Insulin 100 unit/mL (3 mL) subcutaneous pen] 15 mL 6    Sig: INJECT 10 UNITS INTO THE SKIN AT BEDTIME     Endocrinology:  Diabetes - Insulins Passed - 03/31/2021 10:49 AM      Passed - HBA1C is between 0 and 7.9 and within 180 days    Hgb A1c MFr Bld  Date Value Ref Range Status  02/08/2021 5.9 (H) 4.8 - 5.6 % Final    Comment:    (NOTE) Pre diabetes:          5.7%-6.4%  Diabetes:              >6.4%  Glycemic control for   <7.0% adults with diabetes           Passed - Valid encounter within last 6 months    Recent Outpatient Visits           2 days ago Essential hypertension   [place], Vernia Buff, NP   2 months ago Essential hypertension   [place], Vernia Buff, NP   3 months ago Essential hypertension   [place], Vernia Buff, NP   3 months ago Cervical radiculopathy   [place], Deborah B, MD   4 months ago Stage 3b chronic kidney disease [facility])   [place] Gildardo Pounds, NP       Future Appointments             In 3 weeks Tresa Endo, [place]   In 1 month Gildardo Pounds, NP [place]

## 2021-03-31 NOTE — Progress Notes (Signed)
Patient Care Team: Gildardo Pounds, NP as PCP - General (Nurse Practitioner) Nicholas Lose, MD as Consulting Physician (Hematology and Oncology)  DIAGNOSIS:    ICD-10-CM   1. Malignant neoplasm of upper-outer quadrant of right breast in female, estrogen receptor positive ([place])  C50.411    Z17.0     2. MGUS (monoclonal gammopathy of unknown significance)  D47.2       SUMMARY OF ONCOLOGIC HISTORY: Oncology History  Malignant neoplasm of upper-outer quadrant of right breast in female, estrogen receptor positive ([place])  09/03/2017 Initial Diagnosis   Screening mammogram detected 1.4 cm right breast mass at 10 o'clock position, additional pleomorphic calcifications 2 cm posteriorly, additional loosely grouped calcifications 10.1 x 7.1 x 5.3 cm, single right axillary lymph node: Biopsy revealed DCIS ER 100%, PR 50%; IDC grade 2-3 ER 100%, PR 50%, Ki-67 15%, HER-2 positive ratio 2, copy #5, lymph node negative T2 N0 stage Ia clinical stage AJCC 8   09/03/2017 Cancer Staging   Staging form: Breast, AJCC 8th Edition - Clinical stage from 09/03/2017: Stage IB (cT2, cN0, cM0, G3, ER+, PR+, HER2+)   11/27/2017 Surgery   Right mastectomy ([place]) ([id number]): IDC grade 2, 1.6 cm, separate foci of DCIS intermediate grade, margins negative, 1/5 LN positive for micrometasatic carcinoma. ER 100%, PR 50%, Ki-67 15%, HER-2 positive ratio 2, copy #5 T1c N0 stage Ia   11/27/2017 Cancer Staging   Staging form: Breast, AJCC 8th Edition - Pathologic stage from 11/27/2017: Stage IA (pT1c, pN0, cM0, G2, ER+, PR+, HER2+)     02/07/2018 - 03/12/2019 Chemotherapy   trastuzumab (HERCEPTIN) 483 mg in sodium chloride 0.9 % 250 mL chemo infusion, 8 mg/kg = 483 mg, Intravenous,  Once, 11 of 11 cycles. Administration: 483 mg (02/07/2018), 357 mg (02/27/2018), 357 mg (05/01/2018), 357 mg (05/23/2018), 357 mg (06/12/2018), 357 mg (07/03/2018), 357 mg (08/14/2018), 357 mg (09/04/2018), 357 mg (10/17/2018), 357 mg (03/20/2018), 357 mg  (04/10/2018)  trastuzumab-anns (KANJINTI) 357 mg in sodium chloride 0.9 % 250 mL chemo infusion, 6 mg/kg = 357 mg (100 % of original dose 6 mg/kg), Intravenous,  Once, 7 of 7 cycles. Dose modification: 6 mg/kg (original dose 6 mg/kg, Cycle 12, Reason: Other (see comments)). Administration: 357 mg (11/06/2018), 357 mg (11/27/2018), 357 mg (12/18/2018), 357 mg (01/08/2019), 357 mg (01/29/2019), 357 mg (02/19/2019), 357 mg (03/12/2019).   12/2018 - 12/2023 Anti-estrogen oral therapy   Anastrozole     CHIEF COMPLIANT: Follow-up of MGUS and right breast cancer  INTERVAL HISTORY: [name] is a 71 y.o. 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 She presents to the clinic today for follow-up.  She does not report any new problems or concerns.  She is walking slowly with the help of a cane.  ALLERGIES:  has No Known Allergies.  MEDICATIONS:  Current Outpatient Medications  Medication Sig Dispense Refill   albuterol (PROVENTIL) (2.5 MG/3ML) 0.083% nebulizer solution Take 3 mLs (2.5 mg total) by nebulization every 6 (six) hours as needed for wheezing or shortness of breath. 75 mL 3   ALPRAZolam (XANAX) 0.25 MG tablet Take 0.25 mg by mouth at bedtime.     amLODipine (NORVASC) 10 MG tablet Take 10 mg by mouth daily.     anastrozole (ARIMIDEX) 1 MG tablet Take 1 tablet (1 mg total) by mouth daily. 90 tablet 3   aspirin 81 MG EC tablet Take 1 tablet (81 mg total) by mouth daily. 30 tablet 0   atorvastatin (LIPITOR) 20 MG tablet  Take 20 mg by mouth daily.     Blood Pressure Monitor DEVI Please provide patient with insurance approved blood pressure monitor. I10.0 1 each 0   budesonide-formoterol (SYMBICORT) 160-4.5 MCG/ACT inhaler Inhale 1 puff into the lungs 2 (two) times daily as needed (respiratory issues.). 1 each 6   diclofenac Sodium (VOLTAREN) 1 % GEL Apply 4 g topically 4 (four) times daily. 100 g 1   doxycycline (VIBRA-TABS) 100 MG tablet Take 1 tablet (100 mg total) by mouth 2  (two) times daily. 14 tablet 0   escitalopram (LEXAPRO) 10 MG tablet Take 1 tablet (10 mg total) by mouth daily. DEPRESSION 30 tablet 2   HYDROcodone-acetaminophen (NORCO) 5-325 MG tablet Take 1 tablet by mouth every 6 (six) hours as needed (post op pain). 20 tablet 0   Insulin Pen Needle (TRUEPLUS PEN NEEDLES) 31G X 8 MM MISC Inject into the skin once nightly.  E11.65 100 each 1   LANTUS SOLOSTAR 100 UNIT/ML Solostar Pen Inject 5 Units into the skin at bedtime. 15 mL 2   losartan (COZAAR) 50 MG tablet Take 1 tablet (50 mg total) by mouth daily. 90 tablet 0   metoprolol succinate (TOPROL-XL) 50 MG 24 hr tablet Take 50 mg by mouth daily.     Misc. Devices MISC Please provide patient with nebulizer mask and tubing. ICD-10J44.9 1 each 0   Multiple Vitamin (MULTIVITAMIN WITH MINERALS) TABS tablet Take 1 tablet by mouth daily. [place]     nitroGLYCERIN (NITROSTAT) 0.4 MG SL tablet Place 1 tablet (0.4 mg total) under the tongue every 5 (five) minutes as needed for chest pain. 10 tablet 0   Omega-3 Fatty Acids (FISH OIL) 1000 MG CAPS Take 1,000 mg by mouth daily.     oxyCODONE-acetaminophen (PERCOCET/ROXICET) 5-325 MG tablet Take 1 tablet by mouth every 6 (six) hours as needed for severe pain. 15 tablet 0   pantoprazole (PROTONIX) 40 MG tablet Take 40 mg by mouth daily.     VICTOZA 18 MG/3ML SOPN INJECT 1.8 MG INTO THE SKIN DAILY 9 mL 2   No current facility-administered medications for this visit.    PHYSICAL EXAMINATION: ECOG PERFORMANCE STATUS: 1 - Symptomatic but completely ambulatory  Vitals:   04/03/21 0903  BP: 136/67  Pulse: 88  Resp: 18  Temp: 97.7 F (36.5 C)  SpO2: 100%   Filed Weights   04/03/21 0903  Weight: 124 lb 4.8 oz (56.4 kg)     LABORATORY DATA:  I have reviewed the data as listed CMP Latest Ref Rng & Units 03/17/2021 03/14/2021 02/08/2021  Glucose 70 - 99 mg/dL 75 204(H) 94  BUN 8 - 23 mg/dL 26(H) 26(H) 20  Creatinine 0.44 - 1.00 mg/dL 1.95(H) 1.83(H) 1.95(H)  Sodium  135 - 145 mmol/L 138 137 142  Potassium 3.5 - 5.1 mmol/L 3.9 3.9 4.7  Chloride 98 - 111 mmol/L 105 107 108  CO2 22 - 32 mmol/L 24 21(L) 28  Calcium 8.9 - 10.3 mg/dL 9.9 8.9 9.9  Total Protein 6.5 - 8.1 g/dL 8.6(H) - -  Total Bilirubin 0.3 - 1.2 mg/dL 0.7 - -  Alkaline Phos 38 - 126 U/L 214(H) - -  AST 15 - 41 U/L 29 - -  ALT 0 - 44 U/L 37 - -    Lab Results  Component Value Date   WBC 11.2 (H) 03/17/2021   HGB 10.0 (L) 03/17/2021   HCT 31.3 (L) 03/17/2021   MCV 93.4 03/17/2021   PLT 367 03/17/2021  NEUTROABS 7.5 03/17/2021    ASSESSMENT & PLAN:  Malignant neoplasm of upper-outer quadrant of right breast in female, estrogen receptor positive ([place]) History of breast cancer: Currently on anastrozole therapy and doing well. Breast cancer surveillance: Mammogram 02/09/2021: Benign breast density category B  MGUS (monoclonal gammopathy of unknown significance) Lab review: 11/14/2020: M spike: 1.9 g, IgG 2884, immunofixation shows IgG kappa Creatinine: 2.38 (baseline creatinine 1.6), calcium 9.7, albumin 4.5, hemoglobin 13.1 11/29/2020: M spike 1.6 g IgG kappa,Kappa lambda ratio: 9 (Kappa 136.9, lambda 15.2),beta-2 microglobulin: 3.4, creatinine 2.3, hemoglobin 12 03/17/2021: M spike: 1.3 g IgG kappa, kappa lambda ratio 7.72 (Kappa 195, lambda 25), creatinine 1.95, calcium 9.9, albumin 3.8, hemoglobin 10  12/09/2020: Bone survey: No lytic lesions   Acute renal failure could be related to underlying cardiac issues. Anemia due to chronic kidney disease  Return to clinic in 6 months with labs and follow-up and if those labs look stable then we can see her once a year.    No orders of the defined types were placed in this encounter.  The patient has a good understanding of the overall plan. she agrees with it. she will call with any problems that may develop before the next visit here.  Total time spent: 20 mins including face to face time and time spent for planning, charting and  coordination of care  Rulon Eisenmenger, MD, MPH 04/03/2021  I, Thana Ates, am acting as scribe for Dr. Nicholas Lose.  I have reviewed the above documentation for accuracy and completeness, and I agree with the above.

## 2021-04-03 ENCOUNTER — Inpatient Hospital Stay (HOSPITAL_BASED_OUTPATIENT_CLINIC_OR_DEPARTMENT_OTHER): Payer: Medicare Other | Admitting: Hematology and Oncology

## 2021-04-03 ENCOUNTER — Other Ambulatory Visit: Payer: Self-pay

## 2021-04-03 ENCOUNTER — Inpatient Hospital Stay: Payer: Medicare Other

## 2021-04-03 VITALS — BP 136/67 | HR 88 | Temp 97.7°F | Resp 18 | Ht 59.0 in | Wt 124.3 lb

## 2021-04-03 DIAGNOSIS — D472 Monoclonal gammopathy: Secondary | ICD-10-CM

## 2021-04-03 DIAGNOSIS — C50411 Malignant neoplasm of upper-outer quadrant of right female breast: Secondary | ICD-10-CM | POA: Diagnosis not present

## 2021-04-03 DIAGNOSIS — Z170 Estrogen receptor positive status [ER+]: Secondary | ICD-10-CM

## 2021-04-03 DIAGNOSIS — N189 Chronic kidney disease, unspecified: Secondary | ICD-10-CM | POA: Diagnosis not present

## 2021-04-03 DIAGNOSIS — Z79811 Long term (current) use of aromatase inhibitors: Secondary | ICD-10-CM | POA: Diagnosis not present

## 2021-04-03 DIAGNOSIS — D631 Anemia in chronic kidney disease: Secondary | ICD-10-CM | POA: Diagnosis not present

## 2021-04-03 DIAGNOSIS — Z79899 Other long term (current) drug therapy: Secondary | ICD-10-CM | POA: Diagnosis not present

## 2021-04-03 NOTE — Assessment & Plan Note (Signed)
History of breast cancer: Currently on anastrozole therapy and doing well. Breast cancer surveillance: Mammogram 02/09/2021: Benign breast density category B 

## 2021-04-03 NOTE — Assessment & Plan Note (Signed)
Lab review: 11/14/2020: M spike: 1.9 g, IgG 2884, immunofixation shows IgG kappa Creatinine: 2.38 (baseline creatinine 1.6), calcium 9.7, albumin 4.5, hemoglobin 13.1 11/29/2020: M spike 1.6 g IgG kappa,Kappa lambda ratio: 9 (Kappa 136.9, lambda 15.2),beta-2 microglobulin: 3.4, creatinine 2.3, hemoglobin 12 03/17/2021: M spike: 1.3 g IgG kappa, kappa lambda ratio 7.72 (Kappa 195, lambda 25), creatinine 1.95, calcium 9.9, albumin 3.8, hemoglobin 10  12/09/2020: Bone survey: No lytic lesions  Acute renal failure could be related to underlying cardiac issues. Anemia due to chronic kidney disease  Return to clinic in 1 year with labs and follow-up

## 2021-04-07 ENCOUNTER — Ambulatory Visit (HOSPITAL_COMMUNITY)
Admission: RE | Admit: 2021-04-07 | Discharge: 2021-04-07 | Disposition: A | Discharge status: Home / Self Care | Payer: Medicare Other | Source: Ambulatory Visit | Attending: Nurse Practitioner | Admitting: Nurse Practitioner

## 2021-04-07 ENCOUNTER — Encounter (HOSPITAL_COMMUNITY): Payer: Self-pay

## 2021-04-07 ENCOUNTER — Other Ambulatory Visit: Payer: Self-pay

## 2021-04-07 ENCOUNTER — Ambulatory Visit (HOSPITAL_BASED_OUTPATIENT_CLINIC_OR_DEPARTMENT_OTHER): Payer: Medicare Other

## 2021-04-07 ENCOUNTER — Telehealth: Payer: Self-pay | Admitting: *Deleted

## 2021-04-07 DIAGNOSIS — F17200 Nicotine dependence, unspecified, uncomplicated: Secondary | ICD-10-CM

## 2021-04-07 NOTE — Telephone Encounter (Addendum)
[name] from [facility] called to say [name]. [place] is there and that she does not meet criteria for CT ordered. She only smokes 3 cigarrettes a day. They can do a CT scan but need a different code. [name] needs to speak to [name] herself. [name]'s number at [facility] is [phone]. ?

## 2021-04-07 NOTE — Telephone Encounter (Signed)
Called [facility] CT department back and spoke with Santiago Glad again. Called to notify her that I have been unable to reach Z. Fleming or the flow coordinator. For CHW.  I have sent a message via teams to the flow coordinator and called the office for her and could not reach her.  Santiago Glad states that the patient has just left. Will route this information to the office.  ?

## 2021-04-09 ENCOUNTER — Other Ambulatory Visit: Payer: Self-pay | Admitting: Nurse Practitioner

## 2021-04-10 ENCOUNTER — Other Ambulatory Visit: Payer: Self-pay | Admitting: Nurse Practitioner

## 2021-04-10 DIAGNOSIS — J418 Mixed simple and mucopurulent chronic bronchitis: Secondary | ICD-10-CM

## 2021-04-10 NOTE — Telephone Encounter (Signed)
Prescribed by historical provider ?

## 2021-04-10 NOTE — Telephone Encounter (Signed)
Returned call. LVM. She can always reach me through staff message or teams. thanks ?

## 2021-04-10 NOTE — Telephone Encounter (Signed)
Requested medications are due for refill today.  unsure ? ?Requested medications are on the active medications list.  yes ? ?Last refill. 01/22/2021 ? ?Future visit scheduled.   yes ? ?Notes to clinic.  Medication is listed as historical. ? ? ? ?Requested Prescriptions  ?Pending Prescriptions Disp Refills  ? metoprolol succinate (TOPROL-XL) 50 MG 24 hr tablet [Pharmacy Med Name: metoprolol succinate ER 50 mg tablet,extended release 24 hr] 90 tablet 11  ?  Sig: TAKE ONE TABLET BY MOUTH EVERY DAY *TAKE WITH OR IMMEDIATELY FOLLOWING A MEAL*  ?  ? Cardiovascular:  Beta Blockers Passed - 04/09/2021  8:03 AM  ?  ?  Passed - Last BP in normal range  ?  BP Readings from Last 1 Encounters:  ?04/03/21 136/67  ?  ?  ?  ?  Passed - Last Heart Rate in normal range  ?  Pulse Readings from Last 1 Encounters:  ?04/03/21 88  ?  ?  ?  ?  Passed - Valid encounter within last 6 months  ?  Recent Outpatient Visits   ? ?      ? 1 week ago Essential hypertension  ? [place], [place] W, NP  ? 3 months ago Essential hypertension  ? [place], [place] W, NP  ? 3 months ago Essential hypertension  ? [place], [place] W, NP  ? 3 months ago Cervical radiculopathy  ? [place] B, MD  ? 4 months ago Stage 3b chronic kidney disease [facility])  ? [place] Gildardo Pounds, NP  ? ?  ?  ?Future Appointments   ? ?        ? In 2 weeks Daisy Blossom, [place], [place]  ? In 1 month Gildardo Pounds, NP [place]  ? ?  ? ?  ?  ?  ?  ?

## 2021-04-12 ENCOUNTER — Other Ambulatory Visit: Payer: Self-pay

## 2021-04-12 ENCOUNTER — Ambulatory Visit (HOSPITAL_COMMUNITY)
Admission: EM | Admit: 2021-04-12 | Discharge: 2021-04-12 | Disposition: A | Discharge status: Home / Self Care | Payer: Medicare Other | Attending: Family Medicine | Admitting: Family Medicine

## 2021-04-12 ENCOUNTER — Emergency Department (HOSPITAL_COMMUNITY): Payer: Medicare Other

## 2021-04-12 ENCOUNTER — Encounter (HOSPITAL_COMMUNITY): Payer: Self-pay

## 2021-04-12 ENCOUNTER — Emergency Department (HOSPITAL_COMMUNITY)
Admission: EM | Admit: 2021-04-12 | Discharge: 2021-04-12 | Disposition: A | Discharge status: Home / Self Care | Payer: Medicare Other | Attending: Emergency Medicine | Admitting: Emergency Medicine

## 2021-04-12 ENCOUNTER — Ambulatory Visit (INDEPENDENT_AMBULATORY_CARE_PROVIDER_SITE_OTHER): Payer: Medicare Other

## 2021-04-12 ENCOUNTER — Encounter (HOSPITAL_COMMUNITY): Payer: Self-pay | Admitting: Emergency Medicine

## 2021-04-12 DIAGNOSIS — M79602 Pain in left arm: Secondary | ICD-10-CM | POA: Diagnosis not present

## 2021-04-12 DIAGNOSIS — R0602 Shortness of breath: Secondary | ICD-10-CM

## 2021-04-12 DIAGNOSIS — R079 Chest pain, unspecified: Secondary | ICD-10-CM

## 2021-04-12 DIAGNOSIS — R0789 Other chest pain: Secondary | ICD-10-CM | POA: Diagnosis not present

## 2021-04-12 DIAGNOSIS — I10 Essential (primary) hypertension: Secondary | ICD-10-CM | POA: Diagnosis not present

## 2021-04-12 DIAGNOSIS — Z7982 Long term (current) use of aspirin: Secondary | ICD-10-CM | POA: Diagnosis not present

## 2021-04-12 DIAGNOSIS — Z79899 Other long term (current) drug therapy: Secondary | ICD-10-CM | POA: Insufficient documentation

## 2021-04-12 DIAGNOSIS — I2510 Atherosclerotic heart disease of native coronary artery without angina pectoris: Secondary | ICD-10-CM | POA: Diagnosis not present

## 2021-04-12 DIAGNOSIS — E119 Type 2 diabetes mellitus without complications: Secondary | ICD-10-CM | POA: Diagnosis not present

## 2021-04-12 DIAGNOSIS — Z794 Long term (current) use of insulin: Secondary | ICD-10-CM | POA: Insufficient documentation

## 2021-04-12 DIAGNOSIS — I517 Cardiomegaly: Secondary | ICD-10-CM | POA: Diagnosis not present

## 2021-04-12 LAB — BASIC METABOLIC PANEL
Anion gap: 11 (ref 5–15)
BUN: 16 mg/dL (ref 8–23)
CO2: 22 mmol/L (ref 22–32)
Calcium: 9.3 mg/dL (ref 8.9–10.3)
Chloride: 106 mmol/L (ref 98–111)
Creatinine, Ser: 1.92 mg/dL — ABNORMAL HIGH (ref 0.44–1.00)
GFR, Estimated: 27 mL/min — ABNORMAL LOW (ref >60)
Glucose, Bld: 125 mg/dL — ABNORMAL HIGH (ref 70–99)
Potassium: 4.3 mmol/L (ref 3.5–5.1)
Sodium: 139 mmol/L (ref 135–145)

## 2021-04-12 LAB — CBC
HCT: 35.7 % — ABNORMAL LOW (ref 36.0–46.0)
Hemoglobin: 11.3 g/dL — ABNORMAL LOW (ref 12.0–15.0)
MCH: 30.3 pg (ref 26.0–34.0)
MCHC: 31.7 g/dL (ref 30.0–36.0)
MCV: 95.7 fL (ref 80.0–100.0)
Platelets: 245 10*3/uL (ref 150–400)
RBC: 3.73 MIL/uL — ABNORMAL LOW (ref 3.87–5.11)
RDW: 13.9 % (ref 11.5–15.5)
WBC: 5.7 10*3/uL (ref 4.0–10.5)
nRBC: 0 % (ref 0.0–0.2)

## 2021-04-12 LAB — TROPONIN I (HIGH SENSITIVITY)
Troponin I (High Sensitivity): 6 ng/L (ref <18)
Troponin I (High Sensitivity): 6 ng/L (ref <18)

## 2021-04-12 LAB — BRAIN NATRIURETIC PEPTIDE: B Natriuretic Peptide: 13.2 pg/mL (ref 0.0–100.0)

## 2021-04-12 LAB — D-DIMER, QUANTITATIVE: D-Dimer, Quant: 1.22 ug/mL-FEU — ABNORMAL HIGH (ref 0.00–0.50)

## 2021-04-12 LAB — CBG MONITORING, ED
Glucose-Capillary: 64 mg/dL — ABNORMAL LOW (ref 70–99)
Glucose-Capillary: 133 mg/dL — ABNORMAL HIGH (ref 70–99)

## 2021-04-12 MED ORDER — NITROGLYCERIN 0.4 MG SL SUBL
SUBLINGUAL_TABLET | SUBLINGUAL | Status: AC
  Filled 2021-04-12: qty 1

## 2021-04-12 MED ORDER — LIDOCAINE 5 % EX PTCH
1.0000 | MEDICATED_PATCH | CUTANEOUS | Status: DC
  Administered 2021-04-12: 1 via TRANSDERMAL
  Filled 2021-04-12: qty 1

## 2021-04-12 MED ORDER — FENTANYL CITRATE PF 50 MCG/ML IJ SOSY
50.0000 ug | PREFILLED_SYRINGE | Freq: Once | INTRAMUSCULAR | Status: AC
  Administered 2021-04-12: 50 ug via INTRAVENOUS
  Filled 2021-04-12: qty 1

## 2021-04-12 MED ORDER — ACETAMINOPHEN 500 MG PO TABS
1000.0000 mg | ORAL_TABLET | Freq: Once | ORAL | Status: AC
  Administered 2021-04-12: 1000 mg via ORAL
  Filled 2021-04-12: qty 2

## 2021-04-12 MED ORDER — GLUCOSE 4 G PO CHEW
CHEWABLE_TABLET | ORAL | Status: AC
  Filled 2021-04-12: qty 1

## 2021-04-12 MED ORDER — HYDROMORPHONE HCL 1 MG/ML IJ SOLN
0.5000 mg | Freq: Once | INTRAMUSCULAR | Status: DC
  Filled 2021-04-12: qty 1

## 2021-04-12 MED ORDER — SODIUM CHLORIDE 0.9 % IV BOLUS
500.0000 mL | Freq: Once | INTRAVENOUS | Status: AC
  Administered 2021-04-12: 500 mL via INTRAVENOUS

## 2021-04-12 MED ORDER — LIDOCAINE 5 % EX PTCH: 1.0000 | MEDICATED_PATCH | CUTANEOUS | 0 refills | Status: DC

## 2021-04-12 MED ORDER — NITROGLYCERIN 0.4 MG SL SUBL
0.4000 mg | SUBLINGUAL_TABLET | Freq: Once | SUBLINGUAL | Status: AC
  Administered 2021-04-12: 0.4 mg via SUBLINGUAL

## 2021-04-12 MED ORDER — TECHNETIUM TO 99M ALBUMIN AGGREGATED
4.0000 | Freq: Once | INTRAVENOUS | Status: AC | PRN
Start: 2021-04-12 — End: 2021-04-12
  Administered 2021-04-12: 4 via INTRAVENOUS

## 2021-04-12 NOTE — ED Notes (Signed)
[facility] called per providers request.  ?

## 2021-04-12 NOTE — ED Provider Notes (Signed)
?  Physical Exam  ?BP 96/78   Pulse 61   Temp 98.6 ?F (37 ?C) (Oral)   Resp 17   Ht '4\' 11"'$  (1.499 m)   Wt 57.6 kg   LMP  (LMP Unknown) Comment: tubal ligation  SpO2 99%   BMI 25.65 kg/m?  ? ? ? ?Procedures  ?Ultrasound ED Echo ? ?Date/Time: 04/12/2021 6:19 PM ?Performed by: Regan Lemming, MD ?Authorized by: Regan Lemming, MD  ? ?Procedure details:  ?  Indications: chest pain   ?  Views: subxiphoid, parasternal long axis view, parasternal short axis view and IVC view   ?  Images: archived   ?  Limitations:  Body habitus ?Findings:  ?  Pericardium: no pericardial effusion   ?  IVC: normal   ?Impression:  ?  Impression: normal   ? ?ED Course / MDM  ?  ?Medical Decision Making ?Amount and/or Complexity of Data Reviewed ?Labs: ordered. ?Radiology: ordered. ? ?Risk ?OTC drugs. ?Prescription drug management. ? ? ?57F, presenting with CP and SOB from urgent care, dimer positive, soft BPs, getting IVFs, has hx of CKD and may not be able to get IV contrast. ? ?Chest x-ray was performed and negative for acute cardiac or pulmonary abnormality.  A VQ scan was performed and ultimately was found to be a normal study.  No evidence of VQ mismatch to suggest PE.  Delta troponins were obtained and were negative.  ? ?I examined the patient bedside.  She has reproducible tenderness to palpation of the right side of her chest wall on exam.  She is not tachycardic or tachypneic and is afebrile.  She is hemodynamically stable.  She is saturating well on room air.  She has a history of CAD, cocaine abuse, CKD and cannot take NSAIDs.  We will try a lidocaine patch and Tylenol for pain control. ? ?On reassessment, the patient felt symptomatically improved following these above interventions.  I did perform bedside POCUS which revealed no evidence of pericardial effusion.  She has negative cardiac enzymes with a normal VQ scan. Stable for DC and PCP follow-up. ? ?The patient has been appropriately medically screened and/or stabilized in  the ED. I have low suspicion for any other emergent medical condition which would require further screening, evaluation or treatment in the ED or require inpatient management. ? ? ?  ?Regan Lemming, MD ?04/12/21 1822 ? ?

## 2021-04-12 NOTE — Discharge Instructions (Addendum)
You have reproducible chest wall tenderness to palpation.  Your VQ scan did not reveal evidence of acute blood clot in your lungs.  Your cardiac enzymes were normal. Recommend you follow-up with your primary doctor outpatient as needed. ?

## 2021-04-12 NOTE — ED Notes (Signed)
Patient verbalizes understanding of d/c instructions. Opportunities for questions and answers were provided. Pt d/c from ED and wheeled to lobby.  

## 2021-04-12 NOTE — ED Provider Notes (Signed)
[place]   [id number] 04/12/21 Arrival Time: 2831  ASSESSMENT & PLAN:  1. Chest pain, unspecified type   2. SOB (shortness of breath)    Cannot r/o ACS here, esp given PMH. Discussed. To ED via [facility]; stable upon discharge.  No response to NTG x 1 given just before EMS transport.  ECG: Performed today and interpreted by me: normal sinus rhythm. No STEMI.  I have personally viewed the imaging studies ordered this visit. No acute changes on CXR. No pneumothorax.  Labs Reviewed  CBG MONITORING, ED - Abnormal; Notable for the following components:      Result Value   Glucose-Capillary 64 (*)    All other components within normal limits    Meds ordered this encounter  Medications   nitroGLYCERIN (NITROSTAT) SL tablet 0.4 mg   Imaging: DG Chest 2 View  Result Date: 04/12/2021 CLINICAL DATA:  Shortness of breath.  Chest pain. EXAM: CHEST - 2 VIEW COMPARISON:  03/14/2021 FINDINGS: Cardiac silhouette is again mildly enlarged. Mediastinal contours are within normal limits with moderate calcification again seen within aortic arch. Lungs are clear. There is resolution of the prior posteroinferior opacification on lateral view. No pleural effusion or pneumothorax. Mild-to-moderate multilevel degenerative disc changes of the thoracic spine. Partial visualization of reverse total left shoulder arthroplasty. Cholecystectomy clips. IMPRESSION:: IMPRESSION: 1. Unchanged mild cardiomegaly. 2. No acute lung process. Electronically Signed   By: Yvonne Kendall M.D.   On: 04/12/2021 11:27      Reviewed expectations re: course of current medical issues. Questions answered. Outlined signs and symptoms indicating need for more acute intervention. Patient verbalized understanding.   SUBJECTIVE:  History from: patient. [name] is a 71 y.o. female with DM, CAD (reported six stents placed in the past), HTN, hyperlipidemia who presents with complaint of sternal CP; abrupt onset  yest evening; NTG x 2 without relief. Reports trouble falling asleep but slept until this am. Does report continued CP with associated SOB and some discomfort in her L arm that has resolved; is SOB here though and currently feeling CP described as a "tightness". Has h/o angina but reports she rarely takes her NTG. No LE edema. Is ambulatory. Without recent illnesses or fevers. Does reports she has needed to sleep upright for quite awhile. Very distant h/o cocaine abuse; denies current recreational drug use. Denies: irregular heart beat, palpitations, and syncope. No new medications.  Social History   Tobacco Use  Smoking Status Former   Packs/day: 0.25   Years: 47.00   Pack years: 11.75   Types: Cigarettes   Quit date: 05/09/2010   Years since quitting: 10.9  Smokeless Tobacco Never    OBJECTIVE:  Vitals:   04/12/21 1052  BP: (!) 164/64  Pulse: 69  Resp: (!) 28  Temp: 98.5 F (36.9 C)  TempSrc: Oral  SpO2: 100%    Recheck RR: 22  General appearance: alert, oriented, no acute distress; sitting in wheelchair Eyes: PERRLA; EOMI; conjunctivae normal HENT: normocephalic; atraumatic Neck: supple with FROM Lungs: without labored respirations; speaks full sentences without difficulty; CTAB Heart: regular rate and rhythm  Chest Wall: without tenderness to palpation Abdomen: soft, non-tender Extremities: without significant edema; without calf swelling or tenderness; symmetrical without gross deformities Skin: warm and dry; without rash or lesions Neuro: normal gait Psychological: alert and cooperative; normal mood and affect  No Known Allergies  Past Medical History:  Diagnosis Date   Ambulates with cane    straight   Angina  no current problems per patient at PAT appt 11/05/18, nitroglycerin sl 06/30/20   Arthritis    HANDS"   Asthma    Cancer ([place]) 09/04/2017   Right Breast   Carpal tunnel syndrome, bilateral 11/26/2016   Cholelithiasis    Cocaine abuse ([place])  07/2012   per E.R. drug screen   COPD (chronic obstructive pulmonary disease) ([place])    per patient   Coronary artery disease    Diabetes mellitus without mention of complication    type 2   Dysphagia    esophageal dysmotility on 03/2011 esophagram    Fatty liver disease, nonalcoholic 8338   on Imaging.    GERD (gastroesophageal reflux disease)    Headache    Hearing loss    bilateral - no hearing aids   History of colonic polyps 2009   adenomatous 2009, HP in 2009, 2011, 2013.    Hyperlipidemia    Hypertension    Iron deficiency anemia    Myocardial infarction ([place])    years ago, 6 stents placed   Ulnar neuropathy at elbow, 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   Wears dentures    full upper and partial lower   Wears glasses    Social History   Socioeconomic History   Marital status: Divorced    Spouse name: Not on file   Number of children: 2   Years of education: Not on file   Highest education level: Not on file  Occupational History   Occupation: retired. prev worked in housekeeping w/ [employer]  Tobacco Use   Smoking status: Former    Packs/day: 0.25    Years: 47.00    Pack years: 11.75    Types: Cigarettes    Quit date: 05/09/2010    Years since quitting: 10.9   Smokeless tobacco: Never  Vaping Use   Vaping Use: Never used  Substance and Sexual Activity   Alcohol use: Not Currently   Drug use: Not Currently    Types: Cocaine    Comment: positive drug screen (cocaine, benzo's)07/2012 in emergency room   Sexual activity: Not Currently    Birth control/protection: Post-menopausal  Other Topics Concern   Not on file  Social History Narrative   Caffeine use daily.   Social Determinants of Health   Financial Resource Strain: Not on file  Food Insecurity: Not on file  Transportation Needs: Not on file  Physical Activity: Not on file  Stress: Not on file  Social Connections: Not on file  Intimate Partner Violence: Not on file   Family History  Problem Relation Age of Onset    Heart disease Mother    Diabetes Mother    Cancer Father        unsure what kind   Diabetes Sister    Colon cancer Neg Hx    Breast cancer Neg Hx    Past Surgical History:  Procedure Laterality Date   ABDOMINAL ANGIOGRAM  02/20/2011   Procedure: ABDOMINAL ANGIOGRAM;  Surgeon: Clent Demark, MD;  Location: [facility] CATH LAB;  Service: Cardiovascular;;   BREAST BIOPSY Left    CARPAL TUNNEL RELEASE Right 07/04/2017   Procedure: RIGHT CARPAL TUNNEL RELEASE;  Surgeon: Leanora Cover, MD;  Location: [facility];  Service: Orthopedics;  Laterality: Right;   CARPAL TUNNEL RELEASE Left 09/12/2017   Procedure: LEFT CARPAL TUNNEL RELEASE;  Surgeon: Leanora Cover, MD;  Location: [place];  Service: Orthopedics;  Laterality: Left;   CHOLECYSTECTOMY N/A 11/11/2018   Procedure: ATTEMPTED LAPAROSCOPIC CHOLECUSTECOMY,  OPEN  CHOLECYSTECTOMY;  Surgeon: Coralie Keens, MD;  Location: [place];  Service: General;  Laterality: N/A;   COLONOSCOPY  11/30/2011   Procedure: COLONOSCOPY;  Surgeon: Lafayette Dragon, MD;  [facility]: [facility];  Service: Endoscopy;  Laterality: N/A;   CORONARY ANGIOPLASTY WITH STENT PLACEMENT  02/20/2011   ERCP N/A 10/03/2018   Procedure: ENDOSCOPIC RETROGRADE CHOLANGIOPANCREATOGRAPHY (ERCP);  Surgeon: Ladene Artist, MD;  Location: [facility];  Service: Endoscopy;  Laterality: N/A;   ESOPHAGOGASTRODUODENOSCOPY  11/30/2011   Procedure: ESOPHAGOGASTRODUODENOSCOPY (EGD);  Surgeon: Lafayette Dragon, MD;  Location: [facility];  Service: Endoscopy;  Laterality: N/A;   ESOPHAGOGASTRODUODENOSCOPY N/A 02/23/2015   Procedure: ESOPHAGOGASTRODUODENOSCOPY (EGD);  Surgeon: Gatha Mayer, MD;  [facility] ENDOSCOPY;  Service: Endoscopy;  Laterality: N/A;   EYE SURGERY Bilateral    cataracts removed   INTRAOPERATIVE CHOLANGIOGRAM N/A 11/11/2018   Procedure: Intraoperative Cholangiogram;  Surgeon: Coralie Keens, MD;  Location: [place];  Service: General;  Laterality: N/A;    IR REMOVAL TUN ACCESS W/ PORT W/O FL MOD SED  06/30/2020   LEFT HEART CATHETERIZATION WITH CORONARY ANGIOGRAM N/A 02/20/2011   Procedure: LEFT HEART CATHETERIZATION WITH CORONARY ANGIOGRAM;  Surgeon: Clent Demark, MD;  Location: [facility] CATH LAB;  Service: Cardiovascular;  Laterality: N/A;   MASTECTOMY Right 11/05/2017   MASTECTOMY W/ SENTINEL NODE BIOPSY Right 11/27/2017   MASTECTOMY W/ SENTINEL NODE BIOPSY Right 11/27/2017   Procedure: RIGHT TOTAL MASTECTOMY WITH SENTINEL LYMPH NODE BIOPSY;  Surgeon: Excell Seltzer, MD;  Location: [place];  Service: General;  Laterality: Right;   PORT A CATH REVISION Left 05/07/2018   Procedure: PORT A CATH REVISION;  Surgeon: Excell Seltzer, MD;  Location: [facility];  Service: General;  Laterality: Left;   PORTACATH PLACEMENT Left 11/27/2017   Procedure: INSERTION PORT-A-CATH;  Surgeon: Excell Seltzer, MD;  Location: [place];  Service: General;  Laterality: Left;   REVERSE SHOULDER ARTHROPLASTY Left 03/09/2021   Procedure: REVERSE SHOULDER ARTHROPLASTY;  Surgeon: Justice Britain, MD;  Location: [facility];  Service: Orthopedics;  Laterality: Left;   RIGHT COLECTOMY  02/2007   for post polypectomy colonic perforation   SPHINCTEROTOMY  10/03/2018   Procedure: SPHINCTEROTOMY;  Surgeon: Ladene Artist, MD;  Location: [facility];  Service: Endoscopy;;   TUBAL Ezequiel Essex, MD 04/12/21 1254

## 2021-04-12 NOTE — ED Triage Notes (Signed)
Center chest pain and sob started last night.  Patient took one nitroglycerine with no relief.  Took a long time to go to sleep, but did fall asleep.  Patient has complaints of sob, patient reports she normally sleeps sitting upright.  No diaphoresis, but feels hot.   ? ?Patient denies sore throat, cough or runny nose ? ?Patient did not call he provider , [facility] ?

## 2021-04-12 NOTE — ED Provider Notes (Signed)
[facility] EMERGENCY DEPARTMENT Provider Note   CSN: [id number] Arrival date & time: 04/12/21  1232     History  Chief Complaint  Patient presents with   Chest Pain    [name] is a 71 y.o. female.  Presented to the emergency department with concern for chest pain.  Patient has history of diabetes, CAD, hypertension, hyperlipidemia.  Presents to ER with concern for chest pain.  Patient initially went to urgent care and then was advised coming to ER for more extensive work-up.  Patient states pain started yesterday evening, has been relatively constant, central, nonradiating, does occasionally have slight discomfort in left arm.  Does have some associated shortness of breath.  Pain does seem to be worse with deep breath.  Pain described as tightness sensation.  Pain not improved with nitroglycerin.  Reviewed urgent care note from Dr. Mannie Stabile.  Sent to ER for further eval.  HPI     Home Medications Prior to Admission medications   Medication Sig Start Date End Date Taking? Authorizing Provider  albuterol (PROVENTIL) (2.5 MG/3ML) 0.083% nebulizer solution Take 3 mLs (2.5 mg total) by nebulization every 6 (six) hours as needed for wheezing or shortness of breath. 11/15/20   Gildardo Pounds, NP  ALPRAZolam Duanne Moron) 0.25 MG tablet Take 0.25 mg by mouth at bedtime.    [provider]  amLODipine (NORVASC) 10 MG tablet Take 10 mg by mouth daily.    [provider]  anastrozole (ARIMIDEX) 1 MG tablet Take 1 tablet (1 mg total) by mouth daily. 09/13/20   Nicholas Lose, MD  aspirin 81 MG EC tablet Take 1 tablet (81 mg total) by mouth daily. 10/13/18   Pokhrel, Corrie Mckusick, MD  atorvastatin (LIPITOR) 20 MG tablet Take 20 mg by mouth daily.    [provider]  Blood Pressure Monitor DEVI Please provide patient with insurance approved blood pressure monitor. I10.0 11/15/20   Gildardo Pounds, NP  diclofenac Sodium (VOLTAREN) 1 % GEL Apply 4 g topically 4  (four) times daily. 09/09/20   Hans Eden, NP  doxycycline (VIBRA-TABS) 100 MG tablet Take 1 tablet (100 mg total) by mouth 2 (two) times daily. Patient not taking: Reported on 04/12/2021 03/14/21   Dorie Rank, MD  escitalopram (LEXAPRO) 10 MG tablet Take 1 tablet (10 mg total) by mouth daily. DEPRESSION 03/29/21   Gildardo Pounds, NP  HYDROcodone-acetaminophen (NORCO) 5-325 MG tablet Take 1 tablet by mouth every 6 (six) hours as needed (post op pain). Patient not taking: Reported on 04/12/2021 03/10/21   Shuford, Olivia Mackie, PA-C  Insulin Pen Needle (TRUEPLUS PEN NEEDLES) 31G X 8 MM MISC Inject into the skin once nightly.  E11.65 12/07/20   Gildardo Pounds, NP  LANTUS SOLOSTAR 100 UNIT/ML Solostar Pen Inject 5 Units into the skin at bedtime. 03/31/21   Charlott Rakes, MD  losartan (COZAAR) 50 MG tablet Take 1 tablet (50 mg total) by mouth daily. 03/29/21 06/27/21  Gildardo Pounds, NP  metoprolol succinate (TOPROL-XL) 50 MG 24 hr tablet Take 50 mg by mouth daily. 01/23/21   [provider]  Misc. Devices MISC Please provide patient with nebulizer mask and tubing. [id number] 12/14/19   Gildardo Pounds, NP  Multiple Vitamin (MULTIVITAMIN WITH MINERALS) TABS tablet Take 1 tablet by mouth daily. [place]entry    [provider]  nitroGLYCERIN (NITROSTAT) 0.4 MG SL tablet Place 1 tablet (0.4 mg total) under the tongue every 5 (five) minutes as needed for chest  pain. 03/15/20   Gildardo Pounds, NP  Omega-3 Fatty Acids (FISH OIL) 1000 MG CAPS Take 1,000 mg by mouth daily.    [provider]  oxyCODONE-acetaminophen (PERCOCET/ROXICET) 5-325 MG tablet Take 1 tablet by mouth every 6 (six) hours as needed for severe pain. Patient not taking: Reported on 04/12/2021 03/14/21   Dorie Rank, MD  pantoprazole (PROTONIX) 40 MG tablet Take 40 mg by mouth daily.    [provider]  SYMBICORT 160-4.5 MCG/ACT inhaler INHALE ONE PUFF BY MOUTH TWICE DAILY AS NEEDED FOR RESPIRATORY ISSUES. 04/10/21   Gildardo Pounds, NP  VICTOZA 18 MG/3ML SOPN INJECT 1.8 MG INTO THE SKIN DAILY 03/13/21   Gildardo Pounds, NP      Allergies    Patient has no known allergies.    Review of Systems   Review of Systems  Constitutional:  Negative for chills and fever.  HENT:  Negative for ear pain and sore throat.   Eyes:  Negative for pain and visual disturbance.  Respiratory:  Positive for shortness of breath. Negative for cough.   Cardiovascular:  Positive for chest pain. Negative for palpitations.  Gastrointestinal:  Negative for abdominal pain and vomiting.  Genitourinary:  Negative for dysuria and hematuria.  Musculoskeletal:  Negative for arthralgias and back pain.  Skin:  Negative for color change and rash.  Neurological:  Negative for seizures and syncope.  All other systems reviewed and are negative.  Physical Exam Updated Vital Signs BP 96/78    Pulse 61    Temp 98.6 F (37 C) (Oral)    Resp 17    Ht '4\' 11"'$  (1.499 m)    Wt 57.6 kg    LMP  (LMP Unknown) Comment: tubal ligation   SpO2 99%    BMI 25.65 kg/m  Physical Exam Vitals and nursing note reviewed.  Constitutional:      General: She is not in acute distress.    Appearance: She is well-developed.  HENT:     Head: Normocephalic and atraumatic.  Eyes:     Conjunctiva/sclera: Conjunctivae normal.  Cardiovascular:     Rate and Rhythm: Normal rate and regular rhythm.     Heart sounds: No murmur heard. Pulmonary:     Effort: Pulmonary effort is normal. No respiratory distress.     Breath sounds: Normal breath sounds.  Abdominal:     Palpations: Abdomen is soft.     Tenderness: There is no abdominal tenderness.  Musculoskeletal:        General: No swelling.     Cervical back: Neck supple.  Skin:    General: Skin is warm and dry.     Capillary Refill: Capillary refill takes less than 2 seconds.  Neurological:     Mental Status: She is alert.  Psychiatric:        Mood and Affect: Mood normal.    ED Results / Procedures / Treatments    Labs (all labs ordered are listed, but only abnormal results are displayed) Labs Reviewed  BASIC METABOLIC PANEL - Abnormal; Notable for the following components:      Result Value   Glucose, Bld 125 (*)    Creatinine, Ser 1.92 (*)    GFR, Estimated 27 (*)    All other components within normal limits  CBC - Abnormal; Notable for the following components:   RBC 3.73 (*)    Hemoglobin 11.3 (*)    HCT 35.7 (*)    All other components within normal limits  D-DIMER, QUANTITATIVE - Abnormal; Notable for the following components:   D-Dimer, Quant 1.22 (*)    All other components within normal limits  CBG MONITORING, ED - Abnormal; Notable for the following components:   Glucose-Capillary 133 (*)    All other components within normal limits  BRAIN NATRIURETIC PEPTIDE  TROPONIN I (HIGH SENSITIVITY)  TROPONIN I (HIGH SENSITIVITY)    EKG EKG Interpretation  Date/Time:  Wednesday April 12 2021 12:34:49 EST Ventricular Rate:  63 PR Interval:  149 QRS Duration: 92 QT Interval:  425 QTC Calculation: 435 R Axis:   -35 Text Interpretation: Sinus rhythm Probable left atrial enlargement Left ventricular hypertrophy Confirmed by Madalyn Rob [phone]) on 04/12/2021 3:21:58 PM  Radiology DG Chest 2 View  Result Date: 04/12/2021 CLINICAL DATA:  Midline chest pain radiating into the back since last night. History of hypertension and diabetes. EXAM: CHEST - 2 VIEW COMPARISON:  Radiographs 04/12/2021 and 03/14/2021.  CT 03/22/2018. FINDINGS: 1321 hours. The heart size and mediastinal contours are stable with aortic atherosclerosis. The lungs are clear. There is no pleural effusion or pneumothorax. Degenerative changes are present within the spine. No acute osseous findings are seen status post left shoulder reverse arthroplasty. There are postsurgical changes in the right breast or axilla. IMPRESSION: Stable chest.  No acute cardiopulmonary process. Electronically Signed   By: Richardean Sale M.D.    On: 04/12/2021 13:39   DG Chest 2 View  Result Date: 04/12/2021 CLINICAL DATA:  Shortness of breath.  Chest pain. EXAM: CHEST - 2 VIEW COMPARISON:  03/14/2021 FINDINGS: Cardiac silhouette is again mildly enlarged. Mediastinal contours are within normal limits with moderate calcification again seen within aortic arch. Lungs are clear. There is resolution of the prior posteroinferior opacification on lateral view. No pleural effusion or pneumothorax. Mild-to-moderate multilevel degenerative disc changes of the thoracic spine. Partial visualization of reverse total left shoulder arthroplasty. Cholecystectomy clips. IMPRESSION:: IMPRESSION: 1. Unchanged mild cardiomegaly. 2. No acute lung process. Electronically Signed   By: Yvonne Kendall M.D.   On: 04/12/2021 11:27    Procedures Procedures    Medications Ordered in ED Medications  HYDROmorphone (DILAUDID) injection 0.5 mg (0 mg Intravenous Hold 04/12/21 1502)  fentaNYL (SUBLIMAZE) injection 50 mcg (50 mcg Intravenous Given 04/12/21 1333)  sodium chloride 0.9 % bolus 500 mL (500 mLs Intravenous New Bag/Given 04/12/21 1458)    ED Course/ Medical Decision Making/ A&P                           Medical Decision Making Amount and/or Complexity of Data Reviewed Labs: ordered. Radiology: ordered.  Risk Prescription drug management.   71 year old lady presenting to the emergency room with concern for chest pain.  Has quite extensive medical history including history of HF, diabetes, CAD, hypertension, hyperlipidemia.  EKG today does not have any obvious ischemic changes and her initial troponin is normal.  D-dimer noted to be mildly elevated.  Creatinine at baseline.  Due to her low GFR, will order VQ scan.  Provided dose of fentanyl for pain control.  CXR independently reviewed and I interpreted these results.  No obvious infiltrate or edema noted.  No effusion.  No anemia.  While awaiting repeat troponin, VQ scan, signed out to Dr. Armandina Gemma.  He will  follow-up on these results and reassess patient.  Blood pressure when I initially evaluated patient was normal but repeat was slightly low, provided fluid bolus but small/gentle due to history of heart failure.  Final Clinical Impression(s) / ED Diagnoses Final diagnoses:  Chest pain, unspecified type    Rx / DC Orders ED Discharge Orders     None         Lucrezia Starch, MD 04/12/21 1523

## 2021-04-12 NOTE — ED Notes (Signed)
Patient transported to X-ray 

## 2021-04-12 NOTE — ED Notes (Signed)
[facility] present.  

## 2021-04-20 ENCOUNTER — Other Ambulatory Visit: Payer: Self-pay | Admitting: Nurse Practitioner

## 2021-04-20 MED ORDER — TRUEPLUS PEN NEEDLES 31G X 8 MM MISC
1 refills | Status: DC
Start: 2021-04-20 — End: 2021-08-09

## 2021-04-20 NOTE — Telephone Encounter (Signed)
Requested medication (s) are due for refill today:   Yes ? ?Requested medication (s) are on the active medication list:   No  The request is for True Plus pen needles pt is requesting Easy Touch Insulin pen needles ? ?Future visit scheduled:   Yes ? ? ?Last ordered: This is a new request for the Easy Touch Insulin pen needles.    ? ?Requested Prescriptions  ?Pending Prescriptions Disp Refills  ? Insulin Pen Needle (TRUEPLUS PEN NEEDLES) 31G X 8 MM MISC 100 each 1  ?  Sig: Inject into the skin once nightly.  E11.65  ?  ? Endocrinology: Diabetes - Testing Supplies Passed - 04/20/2021 12:34 PM  ?  ?  Passed - Valid encounter within last 12 months  ?  Recent Outpatient Visits   ? ?      ? 3 weeks ago Essential hypertension  ? Sarita Rye, [place] W, NP  ? 3 months ago Essential hypertension  ? Erie Kenyon, [place] W, NP  ? 3 months ago Essential hypertension  ? [place], [place] W, NP  ? 4 months ago Cervical radiculopathy  ? [place] Karle Plumber B, MD  ? 5 months ago Stage 3b chronic kidney disease [facility])  ? [place] Gildardo Pounds, NP  ? ?  ?  ?Future Appointments   ? ?        ? In 5 days Daisy Blossom, Jarome Matin, [place]  ? In 2 weeks Gildardo Pounds, NP [place]  ? ?  ? ?  ?  ?  ? ?

## 2021-04-20 NOTE — Telephone Encounter (Signed)
Pt called in to request / follow up on refill request for Easy Touch Insulin pen needles. Pt says that she was told by her pharmacy that a request was sent, not showing.  ? ? ?Pt would like to have this sent in to the pharmacy ? ?Pharmacy: [facility] [address], [place] - [address] [facility]. ? ? ?Last ov: 03/29/21 ?

## 2021-04-25 ENCOUNTER — Other Ambulatory Visit: Payer: Self-pay

## 2021-04-25 ENCOUNTER — Ambulatory Visit: Payer: Medicare Other | Attending: Family Medicine | Admitting: Pharmacist

## 2021-04-25 DIAGNOSIS — I10 Essential (primary) hypertension: Secondary | ICD-10-CM | POA: Diagnosis not present

## 2021-04-25 MED ORDER — LOSARTAN POTASSIUM 50 MG PO TABS: 50.0000 mg | ORAL_TABLET | Freq: Every day | ORAL | 0 refills | Status: DC

## 2021-04-25 NOTE — Progress Notes (Signed)
? ?S:    ? ?No chief complaint on file. ? ? ?[name] is a 71 y.o. female who presents for hypertension evaluation, education, and management. PMH is significant for HTN, CAD with history of MI, unstable angina, COPD, GERD, T2DM w/ hx of DKA, cholecystitis, cholelithiasis, HLD, CKD III, depression, right breast cancer, MGUS. Patient was referred and last seen by Primary Care Provider, [name], on 03/29/2021. BP was elevated and her losartan dose was increased. ? ?Today, She arrives in good spirits and presents with assistance. She is ambulating slowly with a walker. Denies dizziness, headache, blurred vision, swelling. Does endorse some shortness of breath. She was in the ED recently for this and cardiac workup was negative. She was instructed to follow-up with her PCP.  ? ?Patient reports hypertension is longstanding.  ? ?Family/Social history:  ?-Tobacco: former smoker (stopped this month)  ?-Alcohol: none ? ?Medication adherence reported. No missed doses in the past week. Patient has not taken BP medications today. Of note, she continues to take losartan 25 mg daily. Her 50 mg dose was sent to [facility] and she uses [employer]. She did not call us to let us know this.  ? ?Current antihypertensives include:  amlodipine 10 mg daily, losartan 25 mg daily, metoprolol succinate 50 mg daily ? ?Reported home BP readings: tells me she checks at home but is unable to recall her specific readings. Tells me her "top number was 100-something". ? ?Patient reported dietary habits:  ?-Sodium: stays away from fast food. Uses [employer]. Does not add salt at the table and denies excessive intake of salt snacks.  ?-Caffeine: drinks coffee in the morning. Limits to two cups/day. ? ?Patient-reported exercise habits:  ?-None due to dyspnea  ? ?O:  ?Vitals:  ? 04/25/21 1040  ?BP: (!) 161/70  ?Pulse: 73  ? ?Last 3 Office BP readings: ?BP Readings from Last 3 Encounters:  ?04/12/21 106/82  ?04/12/21 110/66  ?04/03/21 136/67   ? ? ?BMET ?   ?Component Value Date/Time  ? NA 139 04/12/2021 1303  ? NA 140 10/13/2020 0904  ? K 4.3 04/12/2021 1303  ? CL 106 04/12/2021 1303  ? CO2 22 04/12/2021 1303  ? GLUCOSE 125 (H) 04/12/2021 1303  ? BUN 16 04/12/2021 1303  ? BUN 26 10/13/2020 0904  ? CREATININE 1.92 (H) 04/12/2021 1303  ? CREATININE 1.95 (H) 03/17/2021 1414  ? CALCIUM 9.3 04/12/2021 1303  ? GFRNONAA 27 (L) 04/12/2021 1303  ? GFRNONAA 27 (L) 03/17/2021 1414  ? GFRAA 36 (L) 03/15/2020 1154  ? GFRAA 25 (L) 01/29/2019 1039  ? ? ?Renal function: ?Estimated Creatinine Clearance: 20.2 mL/min (A) (by C-G formula based on SCr of 1.92 mg/dL (H)). ? ?Clinical ASCVD: Yes  ?The ASCVD Risk score (Arnett DK, et al., 2019) failed to calculate for the following reasons: ?  The patient has a prior MI or stroke diagnosis ? ? ?A/P: ?Hypertension longstanding currently above goal on current medications. SBP goal < 130 mmHg. Medication adherence appears appropriate with metoprolol and amlodipine, however, she never made the dose increase to 50 mg of losartan daily. I resent this to her [place] and she knows to begin this and stop the 25 mg dose once it arrives in the mail. She has a visit scheduled with her PCP in 2-3 weeks.  ?-Continued metoprolol and amlodipine at current doses. ?-Increase losartan to 50 mg daily. Rx sent to [place] [place] [facility] for mail delivery.  ?-Patient educated on purpose, proper use, and  potential adverse effects of losartan.  ?-F/u labs ordered - none today. Recommend follow-up renal and electrolyte status at PCP f/u. ?-Counseled on lifestyle modifications for blood pressure control including reduced dietary sodium, increased exercise, adequate sleep. ?-Encouraged patient to check BP at home and bring log of readings to next visit. Counseled on proper use of home BP cuff.  ? ?Results reviewed and written information provided. Patient verbalized understanding of treatment plan. Total time in face-to-face counseling 30 minutes.  ? ?F/u  clinic visit in 2-3 weeks with PCP. ? ?Benard Halsted, PharmD, BCACP, CPP ?Clinical Pharmacist ?[place] ?[phone] ? ?

## 2021-04-26 ENCOUNTER — Telehealth: Payer: Self-pay | Admitting: *Deleted

## 2021-04-26 ENCOUNTER — Other Ambulatory Visit: Payer: Self-pay | Admitting: Nurse Practitioner

## 2021-04-26 DIAGNOSIS — I10 Essential (primary) hypertension: Secondary | ICD-10-CM

## 2021-04-26 DIAGNOSIS — J449 Chronic obstructive pulmonary disease, unspecified: Secondary | ICD-10-CM

## 2021-04-26 MED ORDER — NEBULIZER MASK ADULT MISC: 1.0000 [IU] | Freq: Every day | 0 refills | Status: DC | PRN

## 2021-04-26 NOTE — Telephone Encounter (Signed)
Copied from [place] [phone]. Topic: General - Call Back - No Documentation ?>> Apr 26, 2021 10:59 AM Erick Blinks wrote: ?Reason for CRM: Pt is requesting a call back from [facility], has questions about her mask that was supposed to have been ordered yesterday.  ? ? ?[phone] ?

## 2021-04-26 NOTE — Telephone Encounter (Signed)
Pt was seen 04/25/2021 for a BP check. During this visit, she requested a nebulizer with full mask coverage instead of the nebulizer fixture that the pt currently has. I told her I cannot write for medical supplies but will route to her PCP to write if appropriate.  ?

## 2021-04-26 NOTE — Telephone Encounter (Signed)
Mask script in chart. Needs to be printed and faxed.  ?

## 2021-04-27 NOTE — Telephone Encounter (Signed)
Fax sent to pharmacy.  

## 2021-05-04 ENCOUNTER — Telehealth: Payer: Self-pay | Admitting: Nurse Practitioner

## 2021-05-04 ENCOUNTER — Ambulatory Visit: Payer: Self-pay | Admitting: *Deleted

## 2021-05-04 NOTE — Telephone Encounter (Signed)
Pt called to report that she is missing her nebulizer mask, she has the hand held apparatus but apparently she is missing the mask. The recent order says class: print instead of sent somewhere. Please advise  ? ?Ria Comment nurse case manager from [facility] called to request this please advise  ?

## 2021-05-04 NOTE — Telephone Encounter (Signed)
? ? ? ?  Chief Complaint: SOB ?Symptoms: SOB with minimal exertion ?Frequency: 04/12/21 ?Pertinent Negatives: Patient denies  ?Disposition: '[]'$ ED /'[]'$ Urgent Care (no appt availability in office) / '[]'$ Appointment(In office/virtual)/ '[]'$  [place] [facility]/ '[]'$ Home Care/ '[]'$ Refused Recommended Disposition /'[]'$  Mobile Bus/ '[x]'$  Follow-up with PCP ?Additional Notes: Lyndsay Nurse CM with [place] on line as well. States pt still needs mask for nebulizer. Pt states she has been to ED twice and not going back. Has appt next week with Zelda and she says she will "Wait it out." Please advise if earlier appt possible.Advised ED for worsening symptoms. "Not going." ?Reason for Disposition ? [1] Longstanding difficulty breathing (e.g., CHF, COPD, emphysema) AND [2] WORSE than normal ? ?Answer Assessment - Initial Assessment Questions ?1. RESPIRATORY STATUS: "Describe your breathing?" (e.g., wheezing, shortness of breath, unable to speak, severe coughing)  ?    SOB with min exertion ?2. ONSET: "When did this breathing problem begin?"  ?    "Been to ED 2 times" ?3. PATTERN "Does the difficult breathing come and go, or has it been constant since it started?"  ?    varies ?4. SEVERITY: "How bad is your breathing?" (e.g., mild, moderate, severe)  ?  - MILD: No SOB at rest, mild SOB with walking, speaks normally in sentences, can lie down, no retractions, pulse < 100.  ?  - MODERATE: SOB at rest, SOB with minimal exertion and prefers to sit, cannot lie down flat, speaks in phrases, mild retractions, audible wheezing, pulse 100-120.  ?  - SEVERE: Very SOB at rest, speaks in single words, struggling to breathe, sitting hunched forward, retractions, pulse > 120  ?    Moderate ?5. RECURRENT SYMPTOM: "Have you had difficulty breathing before?" If Yes, ask: "When was the last time?" and "What happened that time?"  ?    *No Answer* ?6. CARDIAC HISTORY: "Do you have any history of heart disease?" (e.g., heart attack, angina, bypass  surgery, angioplasty)  ?    *No Answer* ?7. LUNG HISTORY: "Do you have any history of lung disease?"  (e.g., pulmonary embolus, asthma, emphysema) ?    *No Answer* ?8. CAUSE: "What do you think is causing the breathing problem?"  ?    *No Answer* ?9. OTHER SYMPTOMS: "Do you have any other symptoms? (e.g., dizziness, runny nose, cough, chest pain, fever) ?    *No Answer* ?10. O2 SATURATION MONITOR:  "Do you use an oxygen saturation monitor (pulse oximeter) at home?" If Yes, "What is your reading (oxygen level) today?" "What is your usual oxygen saturation reading?" (e.g., 95%) ? ?100%  Checked during call ? ?Protocols used: Breathing Difficulty-A-AH ? ?

## 2021-05-08 ENCOUNTER — Other Ambulatory Visit: Payer: Self-pay | Admitting: Nurse Practitioner

## 2021-05-08 MED ORDER — NEBULIZER MASK ADULT MISC: 1.0000 [IU] | Freq: Every day | 0 refills | Status: DC | PRN

## 2021-05-08 NOTE — Telephone Encounter (Signed)
Nebulizer mask order faxed to [facility] ?

## 2021-05-09 DIAGNOSIS — J449 Chronic obstructive pulmonary disease, unspecified: Secondary | ICD-10-CM | POA: Diagnosis not present

## 2021-05-10 ENCOUNTER — Encounter: Payer: Self-pay | Admitting: Hematology and Oncology

## 2021-05-10 ENCOUNTER — Other Ambulatory Visit: Payer: Self-pay

## 2021-05-10 ENCOUNTER — Telehealth: Payer: Self-pay | Admitting: Nurse Practitioner

## 2021-05-10 ENCOUNTER — Encounter: Payer: Self-pay | Admitting: Nurse Practitioner

## 2021-05-10 ENCOUNTER — Ambulatory Visit: Payer: Medicare Other | Attending: Nurse Practitioner | Admitting: Nurse Practitioner

## 2021-05-10 VITALS — BP 177/74 | HR 74 | Wt 127.0 lb

## 2021-05-10 DIAGNOSIS — Z780 Asymptomatic menopausal state: Secondary | ICD-10-CM

## 2021-05-10 DIAGNOSIS — Z13820 Encounter for screening for osteoporosis: Secondary | ICD-10-CM

## 2021-05-10 DIAGNOSIS — E118 Type 2 diabetes mellitus with unspecified complications: Secondary | ICD-10-CM

## 2021-05-10 DIAGNOSIS — R748 Abnormal levels of other serum enzymes: Secondary | ICD-10-CM

## 2021-05-10 DIAGNOSIS — R0982 Postnasal drip: Secondary | ICD-10-CM

## 2021-05-10 DIAGNOSIS — F32A Depression, unspecified: Secondary | ICD-10-CM

## 2021-05-10 DIAGNOSIS — D649 Anemia, unspecified: Secondary | ICD-10-CM

## 2021-05-10 DIAGNOSIS — I10 Essential (primary) hypertension: Secondary | ICD-10-CM

## 2021-05-10 DIAGNOSIS — F419 Anxiety disorder, unspecified: Secondary | ICD-10-CM | POA: Diagnosis not present

## 2021-05-10 LAB — GLUCOSE, POCT (MANUAL RESULT ENTRY): POC Glucose: 99 mg/dl (ref 70–99)

## 2021-05-10 MED ORDER — BUSPIRONE HCL 10 MG PO TABS: 10.0000 mg | ORAL_TABLET | Freq: Two times a day (BID) | ORAL | 3 refills | Status: AC

## 2021-05-10 MED ORDER — FLUTICASONE PROPIONATE 50 MCG/ACT NA SUSP: 2.0000 | Freq: Every day | NASAL | 6 refills | Status: AC

## 2021-05-10 MED ORDER — BUSPIRONE HCL 10 MG PO TABS
10.0000 mg | ORAL_TABLET | Freq: Two times a day (BID) | ORAL | 3 refills | Status: DC
  Filled 2021-05-10: qty 60, 30d supply, fill #0

## 2021-05-10 MED ORDER — FLUTICASONE PROPIONATE 50 MCG/ACT NA SUSP
2.0000 | Freq: Every day | NASAL | 6 refills | Status: DC
  Filled 2021-05-10: qty 16, 30d supply, fill #0

## 2021-05-10 NOTE — Telephone Encounter (Signed)
Rx sent to the correct pharmacy  

## 2021-05-10 NOTE — Progress Notes (Signed)
? ?Assessment & Plan:  ?Emaleigh was seen today for hypertension. ? ?Diagnoses and all orders for this visit: ? ?Type 2 diabetes mellitus with complication, without long-term current use of insulin (HCC) ?-     POCT glucose (manual entry) ?-     Cancel: POCT glycosylated hemoglobin (Hb A1C) ? ?Primary hypertension ?-     amLODipine (NORVASC) 10 MG tablet; Take 1 tablet (10 mg total) by mouth daily. ? ?Post-nasal drip ?- fluticasone (FLONASE) 50 MCG/ACT nasal spray; Place 2 sprays into both nostrils daily. ?-     pantoprazole (PROTONIX) 40 MG tablet; Take 1 tablet (40 mg total) by mouth daily. For acid reflux ? ?Anxiety and depression ?-     Discontinue: busPIRone (BUSPAR) 10 MG tablet; Take 1 tablet (10 mg total) by mouth 2 (two) times daily. ANXIETY ? ?Encounter for osteoporosis screening in asymptomatic postmenopausal patient ?-     DG Bone Density; Future ? ?Elevated liver enzymes ?-     CMP14+EGFR ? ?Anemia, unspecified type ?-     CBC ? ? ? ?Patient has been counseled on age-appropriate routine health concerns for screening and prevention. These are reviewed and up-to-date. Referrals have been placed accordingly. Immunizations are up-to-date or declined.    ?Subjective:  ? ?Chief Complaint  ?Patient presents with  ? Hypertension  ? ?HPI ?[name] 71 y.o. female presents to office today for follow up to HTN ?  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 ?GERD ?States I feel like mucous is always in my throat that I cant get out. Denies dysphagia or chocking with eating or drinking. She states she is taking protonix as  prescribed however it appears to be expired on her medication profile.  ? ?Feels like her shortness of breath is worsening with minimal exertion. There is no visible pedal edema on exam today. ?Recent Labs  ?  04/12/21 ?1329  ?BNP 13.2  ? ? ? ?HTN ?Blood pressure is poorly controlled today. She endorses adherence taking losartan 50 mg daily, amlodipine 10 mg daily and toprol XL 50 mg daily.  ?BP Readings from Last 3 Encounters:  ?05/10/21 (!) 177/74  ?04/25/21 (!) 161/70  ?04/12/21 106/82  ?  ? ?DM2 ?Well controlled with victoza 1.20m  and lantus 5 units daily. LDL at goal.  ?Lab Results  ?Component Value Date  ? HGBA1C 5.9 (H) 02/08/2021  ?  ?Lab Results  ?Component Value Date  ? LDLCALC 51 03/15/2020  ?  ?Anxiety ?She is very anxious. No longer takin xanax. Will try buspar. She has constant worrying, feels as if something bad is going to happen to her, racing thoughts. She endorses adherence taking lexapro 10 mg.  ? ? ?Review of Systems  ?Constitutional:  Negative for fever, malaise/fatigue and weight loss.  ?HENT:  Positive for hearing loss. Negative for nosebleeds.   ?     SEE HPI  ?Eyes: Negative.  Negative for blurred vision, double vision and photophobia.  ?Respiratory:  Positive for shortness of breath. Negative for cough.   ?Cardiovascular: Negative.  Negative for chest pain, palpitations and leg swelling.  ?Gastrointestinal:  Positive for heartburn. Negative for nausea and vomiting.  ?Musculoskeletal: Negative.  Negative for myalgias.  ?Neurological: Negative.  Negative  for dizziness, focal weakness, seizures and headaches.  ?Psychiatric/Behavioral:  Positive for depression. Negative for suicidal ideas. The patient is nervous/anxious.   ? ?Past Medical History:  ?Diagnosis Date  ? Ambulates with cane   ? straight  ? Angina   ? no current problems per patient at PAT appt 11/05/18, nitroglycerin sl 06/30/20  ? Arthritis   ? HANDS"  ? Asthma   ? Cancer ([place]) 09/04/2017  ? Right Breast  ? Carpal tunnel syndrome,  bilateral 11/26/2016  ? Cholelithiasis   ? Cocaine abuse ([place]) 07/2012  ? per E.R. drug screen  ? COPD (chronic obstructive pulmonary disease) ([place])   ? per patient  ? Coronary artery disease   ? Diabetes mellitus without mention of complication   ? type 2  ? Dysphagia   ? esophageal dysmotility on 03/2011 esophagram   ? Fatty liver disease, nonalcoholic 4166  ? on Imaging.   ? GERD (gastroesophageal reflux disease)   ? Headache   ? Hearing loss   ? bilateral - no hearing aids  ? History of colonic polyps 2009  ? adenomatous 2009, HP in 2009, 2011, 2013.   ? Hyperlipidemia   ? Hypertension   ? Iron deficiency anemia   ? Myocardial infarction [facility])   ? years ago, 6 stents placed  ? Ulnar neuropathy at elbow, 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  ? Wears dentures   ? full upper and partial lower  ? Wears glasses   ? ? ?Past Surgical History:  ?Procedure Laterality Date  ? ABDOMINAL ANGIOGRAM  02/20/2011  ? Procedure: ABDOMINAL ANGIOGRAM;  Surgeon: Clent Demark, MD;  Location: [facility] CATH LAB;  Service: Cardiovascular;;  ? BREAST BIOPSY Left   ? CARPAL TUNNEL RELEASE Right 07/04/2017  ? Procedure: RIGHT CARPAL TUNNEL RELEASE;  Surgeon: Leanora Cover, MD;  Location: [place];  Service: Orthopedics;  Laterality: Right;  ? CARPAL TUNNEL RELEASE Left 09/12/2017  ? Procedure: LEFT CARPAL TUNNEL RELEASE;  Surgeon: Leanora Cover, MD;  Location: [place];  Service: Orthopedics;  Laterality: Left;  ? CHOLECYSTECTOMY N/A 11/11/2018  ? Procedure: ATTEMPTED LAPAROSCOPIC CHOLECUSTECOMY,  OPEN CHOLECYSTECTOMY;  Surgeon: Coralie Keens, MD;  Location: [place];  Service: General;  Laterality: N/A;  ? COLONOSCOPY  11/30/2011  ? Procedure: COLONOSCOPY;  Surgeon: Lafayette Dragon, MD;  Location: [facility] ENDOSCOPY;  Service: Endoscopy;  Laterality: N/A;  ? CORONARY ANGIOPLASTY WITH STENT PLACEMENT  02/20/2011  ? ERCP N/A 10/03/2018  ? Procedure: ENDOSCOPIC RETROGRADE CHOLANGIOPANCREATOGRAPHY (ERCP);  Surgeon: Ladene Artist, MD;  Location:  [facility];  Service: Endoscopy;  Laterality: N/A;  ? ESOPHAGOGASTRODUODENOSCOPY  11/30/2011  ? Procedure: ESOPHAGOGASTRODUODENOSCOPY (EGD);  Surgeon: Lafayette Dragon, MD;  Location: [facility];  Service: Endoscopy;  Laterality: N/A;  ? ESOPHAGOGASTRODUODENOSCOPY N/A 02/23/2015  ? Procedure: ESOPHAGOGASTRODUODENOSCOPY (EGD);  Surgeon: Gatha Mayer, MD;  Location: [facility] ENDOSCOPY;  Service: Endoscopy;  Laterality: N/A;  ? EYE SURGERY Bilateral   ? cataracts removed  ? INTRAOPERATIVE CHOLANGIOGRAM N/A 11/11/2018  ? Procedure: Intraoperative Cholangiogram;  Surgeon: Coralie Keens, MD;  Location: [place];  Service: General;  Laterality: N/A;  ? IR REMOVAL TUN ACCESS W/ PORT W/O FL MOD SED  06/30/2020  ? LEFT HEART CATHETERIZATION WITH CORONARY ANGIOGRAM N/A 02/20/2011  ? Procedure: LEFT HEART CATHETERIZATION WITH CORONARY ANGIOGRAM;  Surgeon: Clent Demark, MD;  Location: [facility] CATH LAB;  Service: Cardiovascular;  Laterality: N/A;  ? MASTECTOMY Right 11/05/2017  ? MASTECTOMY W/ SENTINEL NODE BIOPSY Right 11/27/2017  ? MASTECTOMY W/  SENTINEL NODE BIOPSY Right 11/27/2017  ? Procedure: RIGHT TOTAL MASTECTOMY WITH SENTINEL LYMPH NODE BIOPSY;  Surgeon: Excell Seltzer, MD;  Location: [place];  Service: General;  Laterality: Right;  ? PORT A CATH REVISION Left 05/07/2018  ? Procedure: PORT A CATH REVISION;  Surgeon: Excell Seltzer, MD;  Location: [facility];  Service: General;  Laterality: Left;  ? PORTACATH PLACEMENT Left 11/27/2017  ? Procedure: INSERTION PORT-A-CATH;  Surgeon: Excell Seltzer, MD;  Location: [place];  Service: General;  Laterality: Left;  ? REVERSE SHOULDER ARTHROPLASTY Left 03/09/2021  ? Procedure: REVERSE SHOULDER ARTHROPLASTY;  Surgeon: Justice Britain, MD;  Location: [facility];  Service: Orthopedics;  Laterality: Left;  ? RIGHT COLECTOMY  02/2007  ? for post polypectomy colonic perforation  ? SPHINCTEROTOMY  10/03/2018  ? Procedure: SPHINCTEROTOMY;  Surgeon: Ladene Artist, MD;  Location: [facility];   Service: Endoscopy;;  ? TUBAL LIGATION    ? ? ?Family History  ?Problem Relation Age of Onset  ? Heart disease Mother   ? Diabetes Mother   ? Cancer Father   ?     unsure what kind  ? Diabetes Sister   ? [place]

## 2021-05-10 NOTE — Telephone Encounter (Signed)
Patient called in to inform [name] that she need the Rxes sent to the pharmacy in her area. Say that she need these medications today ? ?[place] ([place]), [place] - [address] Phone:  [phone]  ?Fax:  [phone]  ?  ? ?

## 2021-05-10 NOTE — Addendum Note (Signed)
Addended by: Daisy Blossom, Annie Main L on: 05/10/2021 03:35 PM ? ? Modules accepted: Orders ? ?

## 2021-05-10 NOTE — Telephone Encounter (Signed)
Medication Refill - Medication:  ?busPIRone (BUSPAR) 10 MG tablet ?fluticasone (FLONASE) 50 MCG/ACT nasal spray  ? ?Has the patient contacted their pharmacy? Yes.   ?Sent to the wrong pharmacy, pt requested if it could be sent to the below.  ? ?Preferred Pharmacy (with phone number or street name):  ?[place] ([place]), [place] - [address] Phone:  [phone]  ?Fax:  [phone]  ?  ? ?Has the patient been seen for an appointment in the last year OR does the patient have an upcoming appointment? Yes.   ? ?Agent: Please be advised that RX refills may take up to 3 business days. We ask that you follow-up with your pharmacy. ?

## 2021-05-11 LAB — CMP14+EGFR
ALT: 9 IU/L (ref 0–32)
AST: 13 IU/L (ref 0–40)
Albumin/Globulin Ratio: 1.3 (ref 1.2–2.2)
Albumin: 4.3 g/dL (ref 3.7–4.7)
Alkaline Phosphatase: 126 IU/L — ABNORMAL HIGH (ref 44–121)
BUN/Creatinine Ratio: 11 — ABNORMAL LOW (ref 12–28)
BUN: 25 mg/dL (ref 8–27)
Bilirubin Total: 0.4 mg/dL (ref 0.0–1.2)
CO2: 22 mmol/L (ref 20–29)
Calcium: 10.4 mg/dL — ABNORMAL HIGH (ref 8.7–10.3)
Chloride: 104 mmol/L (ref 96–106)
Creatinine, Ser: 2.22 mg/dL — ABNORMAL HIGH (ref 0.57–1.00)
Globulin, Total: 3.4 g/dL (ref 1.5–4.5)
Glucose: 93 mg/dL (ref 70–99)
Potassium: 4.8 mmol/L (ref 3.5–5.2)
Sodium: 141 mmol/L (ref 134–144)
Total Protein: 7.7 g/dL (ref 6.0–8.5)
eGFR: 23 mL/min/{1.73_m2} — ABNORMAL LOW (ref >59)

## 2021-05-11 LAB — CBC
Hematocrit: 36.3 % (ref 34.0–46.6)
Hemoglobin: 11.6 g/dL (ref 11.1–15.9)
MCH: 29.8 pg (ref 26.6–33.0)
MCHC: 32 g/dL (ref 31.5–35.7)
MCV: 93 fL (ref 79–97)
Platelets: 300 10*3/uL (ref 150–450)
RBC: 3.89 x10E6/uL (ref 3.77–5.28)
RDW: 13.5 % (ref 11.7–15.4)
WBC: 7.2 10*3/uL (ref 3.4–10.8)

## 2021-05-12 ENCOUNTER — Encounter: Payer: Self-pay | Admitting: Nurse Practitioner

## 2021-05-12 MED ORDER — AMLODIPINE BESYLATE 10 MG PO TABS: 10.0000 mg | ORAL_TABLET | Freq: Every day | ORAL | 0 refills | Status: DC

## 2021-05-12 MED ORDER — PANTOPRAZOLE SODIUM 40 MG PO TBEC: 40.0000 mg | DELAYED_RELEASE_TABLET | Freq: Every day | ORAL | 1 refills | Status: DC

## 2021-05-15 ENCOUNTER — Telehealth: Payer: Self-pay | Admitting: Nurse Practitioner

## 2021-05-15 NOTE — Telephone Encounter (Unsigned)
Copied from [place] [phone]. Topic: General - Other ?>> May 15, 2021  1:59 PM Tessa Lerner A wrote: ?Reason for CRM: The patient has called for an update on previously requested medicine for their sinus discomfort ? ?The patient was told by their PCP that a prescription would be submitted for their sinus discomfort  ? ?The patient would like to speak with a member of staff about their medication further when possible ?

## 2021-05-15 NOTE — Telephone Encounter (Signed)
Call to patient- patient states her nasal spray is not at pharmacy. Per chart Rx was sent to pharmacy- 05/11/21. Advised patient I would contact pharmacy to verify. Call to pharmacy-  verified Rx received and they are going to fill now. Left message on patient VM- Rx will be ready for pick up in 1 hour. ?

## 2021-05-16 DIAGNOSIS — E119 Type 2 diabetes mellitus without complications: Secondary | ICD-10-CM | POA: Diagnosis not present

## 2021-05-16 DIAGNOSIS — I2510 Atherosclerotic heart disease of native coronary artery without angina pectoris: Secondary | ICD-10-CM | POA: Diagnosis not present

## 2021-05-16 DIAGNOSIS — E785 Hyperlipidemia, unspecified: Secondary | ICD-10-CM | POA: Diagnosis not present

## 2021-05-16 DIAGNOSIS — I10 Essential (primary) hypertension: Secondary | ICD-10-CM | POA: Diagnosis not present

## 2021-05-17 ENCOUNTER — Encounter (HOSPITAL_COMMUNITY): Payer: Self-pay | Admitting: *Deleted

## 2021-05-17 ENCOUNTER — Emergency Department (HOSPITAL_COMMUNITY): Payer: Medicare Other

## 2021-05-17 ENCOUNTER — Emergency Department (HOSPITAL_COMMUNITY)
Admission: EM | Admit: 2021-05-17 | Discharge: 2021-05-17 | Disposition: A | Discharge status: Home / Self Care | Payer: Medicare Other | Attending: Emergency Medicine | Admitting: Emergency Medicine

## 2021-05-17 ENCOUNTER — Ambulatory Visit (HOSPITAL_COMMUNITY)
Admission: EM | Admit: 2021-05-17 | Discharge: 2021-05-17 | Disposition: A | Discharge status: Home / Self Care | Payer: Medicare Other

## 2021-05-17 ENCOUNTER — Other Ambulatory Visit: Payer: Self-pay

## 2021-05-17 DIAGNOSIS — I2510 Atherosclerotic heart disease of native coronary artery without angina pectoris: Secondary | ICD-10-CM | POA: Insufficient documentation

## 2021-05-17 DIAGNOSIS — J189 Pneumonia, unspecified organism: Secondary | ICD-10-CM | POA: Diagnosis not present

## 2021-05-17 DIAGNOSIS — J441 Chronic obstructive pulmonary disease with (acute) exacerbation: Secondary | ICD-10-CM | POA: Diagnosis not present

## 2021-05-17 DIAGNOSIS — Z7982 Long term (current) use of aspirin: Secondary | ICD-10-CM | POA: Insufficient documentation

## 2021-05-17 DIAGNOSIS — I10 Essential (primary) hypertension: Secondary | ICD-10-CM | POA: Diagnosis not present

## 2021-05-17 DIAGNOSIS — Z79899 Other long term (current) drug therapy: Secondary | ICD-10-CM | POA: Diagnosis not present

## 2021-05-17 DIAGNOSIS — Z20822 Contact with and (suspected) exposure to covid-19: Secondary | ICD-10-CM | POA: Diagnosis not present

## 2021-05-17 DIAGNOSIS — R0602 Shortness of breath: Secondary | ICD-10-CM | POA: Diagnosis not present

## 2021-05-17 DIAGNOSIS — Z794 Long term (current) use of insulin: Secondary | ICD-10-CM | POA: Insufficient documentation

## 2021-05-17 DIAGNOSIS — Z7951 Long term (current) use of inhaled steroids: Secondary | ICD-10-CM | POA: Diagnosis not present

## 2021-05-17 DIAGNOSIS — R9431 Abnormal electrocardiogram [ECG] [EKG]: Secondary | ICD-10-CM | POA: Diagnosis not present

## 2021-05-17 LAB — CBC WITH DIFFERENTIAL/PLATELET
Abs Immature Granulocytes: 0.06 10*3/uL (ref 0.00–0.07)
Basophils Absolute: 0 10*3/uL (ref 0.0–0.1)
Basophils Relative: 1 %
Eosinophils Absolute: 0 10*3/uL (ref 0.0–0.5)
Eosinophils Relative: 1 %
HCT: 35.2 % — ABNORMAL LOW (ref 36.0–46.0)
Hemoglobin: 11 g/dL — ABNORMAL LOW (ref 12.0–15.0)
Immature Granulocytes: 2 %
Lymphocytes Relative: 25 %
Lymphs Abs: 0.7 10*3/uL (ref 0.7–4.0)
MCH: 29.9 pg (ref 26.0–34.0)
MCHC: 31.3 g/dL (ref 30.0–36.0)
MCV: 95.7 fL (ref 80.0–100.0)
Monocytes Absolute: 0.5 10*3/uL (ref 0.1–1.0)
Monocytes Relative: 17 %
Neutro Abs: 1.5 10*3/uL — ABNORMAL LOW (ref 1.7–7.7)
Neutrophils Relative %: 54 %
Platelets: 211 10*3/uL (ref 150–400)
RBC: 3.68 MIL/uL — ABNORMAL LOW (ref 3.87–5.11)
RDW: 13.3 % (ref 11.5–15.5)
WBC: 2.7 10*3/uL — ABNORMAL LOW (ref 4.0–10.5)
nRBC: 0 % (ref 0.0–0.2)

## 2021-05-17 LAB — BASIC METABOLIC PANEL
Anion gap: 7 (ref 5–15)
BUN: 32 mg/dL — ABNORMAL HIGH (ref 8–23)
CO2: 25 mmol/L (ref 22–32)
Calcium: 9.4 mg/dL (ref 8.9–10.3)
Chloride: 105 mmol/L (ref 98–111)
Creatinine, Ser: 2.66 mg/dL — ABNORMAL HIGH (ref 0.44–1.00)
GFR, Estimated: 18 mL/min — ABNORMAL LOW (ref >60)
Glucose, Bld: 120 mg/dL — ABNORMAL HIGH (ref 70–99)
Potassium: 4.2 mmol/L (ref 3.5–5.1)
Sodium: 137 mmol/L (ref 135–145)

## 2021-05-17 LAB — TROPONIN I (HIGH SENSITIVITY)
Troponin I (High Sensitivity): 6 ng/L (ref <18)
Troponin I (High Sensitivity): 6 ng/L (ref <18)

## 2021-05-17 LAB — RESP PANEL BY RT-PCR (FLU A&B, COVID) ARPGX2
Influenza A by PCR: NEGATIVE
Influenza B by PCR: NEGATIVE
SARS Coronavirus 2 by RT PCR: NEGATIVE

## 2021-05-17 LAB — BRAIN NATRIURETIC PEPTIDE: B Natriuretic Peptide: 22 pg/mL (ref 0.0–100.0)

## 2021-05-17 MED ORDER — IPRATROPIUM BROMIDE HFA 17 MCG/ACT IN AERS
2.0000 | INHALATION_SPRAY | Freq: Once | RESPIRATORY_TRACT | Status: AC
  Administered 2021-05-17: 2 via RESPIRATORY_TRACT
  Filled 2021-05-17: qty 12.9

## 2021-05-17 MED ORDER — PREDNISONE 20 MG PO TABS
60.0000 mg | ORAL_TABLET | Freq: Once | ORAL | Status: AC
  Administered 2021-05-17: 60 mg via ORAL
  Filled 2021-05-17: qty 3

## 2021-05-17 MED ORDER — ALBUTEROL SULFATE HFA 108 (90 BASE) MCG/ACT IN AERS
6.0000 | INHALATION_SPRAY | Freq: Once | RESPIRATORY_TRACT | Status: AC
  Administered 2021-05-17: 6 via RESPIRATORY_TRACT
  Filled 2021-05-17: qty 6.7

## 2021-05-17 MED ORDER — PREDNISONE 10 MG PO TABS: 20.0000 mg | ORAL_TABLET | Freq: Every day | ORAL | 0 refills | Status: AC

## 2021-05-17 MED ORDER — DOXYCYCLINE HYCLATE 100 MG PO CAPS
100.0000 mg | ORAL_CAPSULE | Freq: Two times a day (BID) | ORAL | 0 refills | Status: AC
Start: 2021-05-17 — End: 2021-05-27

## 2021-05-17 MED ORDER — DOXYCYCLINE HYCLATE 100 MG PO TABS
100.0000 mg | ORAL_TABLET | Freq: Once | ORAL | Status: AC
  Administered 2021-05-17: 100 mg via ORAL
  Filled 2021-05-17: qty 1

## 2021-05-17 NOTE — ED Notes (Signed)
Pt verbalized understanding of d/c instructions, meds, and followup care. Denies questions. VSS, no distress noted. Steady gait to exit with all belongings.  ?

## 2021-05-17 NOTE — ED Provider Triage Note (Signed)
Emergency Medicine Provider Triage Evaluation Note ? ?[name] , a 71 y.o. female  was evaluated in triage.  Pt complains of sob. ? ?Review of Systems  ?Positive: Cp, sob, cough, fever ?Negative: N/v/d ? ?Physical Exam  ?BP 97/87 (BP Location: Left Arm)   Pulse 91   Temp 98.9 ?F (37.2 ?C) (Oral)   Resp 18   LMP  (LMP Unknown) Comment: tubal ligation  SpO2 100%  ?Gen:   Awake, no distress   ?Resp:  Normal effort  ?MSK:   Moves extremities without difficulty  ?Other:   ? ?Medical Decision Making  ?Medically screening exam initiated at 12:16 PM.  Appropriate orders placed.  [name] was informed that the remainder of the evaluation will be completed by another provider, this initial triage assessment does not replace that evaluation, and the importance of remaining in the ED until their evaluation is complete. ? ?Report recurrent cp and sob worse x 4 days.  Also report fever and cough.  Poor historian ?  ?Domenic Moras, PA-C ?05/17/21 1222 ? ?

## 2021-05-17 NOTE — Discharge Instructions (Addendum)
Take next dose antibiotic and steroid tomorrow.  Recommend albuterol inhaler every 4 hours with 4 puffs as needed.  Recommend Atrovent 2 puffs every 6 hours as needed.  Overall suspect lung infection with COPD exacerbation.  Follow-up with your primary care doctor. ?

## 2021-05-17 NOTE — ED Notes (Signed)
Pt endorses increasing tightness in chest. 2nd troponin drawn, repeat EKG performed. No new orders at this time ?

## 2021-05-17 NOTE — ED Triage Notes (Addendum)
Pt reports having chest pain and sob for several days. Has been to cardiologist and sent here for further eval. Reports recent cough and fever. Also had antibiotic ordered recently for an infection in her nose but has not started it yet. No acute distress is noted at triage.  ?

## 2021-05-17 NOTE — ED Provider Notes (Signed)
?[place] ?Provider Note ? ? ?CSN: [id number] ?Arrival date & time: 05/17/21  1054 ? ?  ? ?History ? ?Chief Complaint  ?Patient presents with  ? Shortness of Breath  ? Chest Pain  ? ? ?[name] is a 71 y.o. female. ? ?The history is provided by the patient.  ?Shortness of Breath ?Severity:  Moderate ?Onset quality:  Gradual ?Duration:  3 days ?Timing:  Intermittent ?Progression:  Unchanged ?Chronicity:  New ?Context: URI   ?Relieved by:  Nothing ?Worsened by:  Nothing ?Associated symptoms: chest pain and cough   ?Associated symptoms: no abdominal pain, no claudication, no diaphoresis, no ear pain, no fever, no headaches, no hemoptysis, no neck pain, no PND, no rash, no sore throat, no sputum production, no syncope, no swollen glands, no vomiting and no wheezing   ?Risk factors comment:  COPD, MI, HLD, HTN, CAD ?Chest Pain ?Associated symptoms: cough and shortness of breath   ?Associated symptoms: no abdominal pain, no claudication, no diaphoresis, no fever, no headache, no PND, no syncope and no vomiting   ? ?  ? ?Home Medications ?Prior to Admission medications   ?Medication Sig Start Date End Date Taking? Authorizing Provider  ?doxycycline (VIBRAMYCIN) 100 MG capsule Take 1 capsule (100 mg total) by mouth 2 (two) times daily for 10 days. 05/17/21 05/27/21 Yes Dynesha Woolen, DO  ?predniSONE (DELTASONE) 10 MG tablet Take 2 tablets (20 mg total) by mouth daily for 4 days. 05/17/21 05/21/21 Yes Tevis Conger, DO  ?albuterol (PROVENTIL) (2.5 MG/3ML) 0.083% nebulizer solution Take 3 mLs (2.5 mg total) by nebulization every 6 (six) hours as needed for wheezing or shortness of breath. 11/15/20   Gildardo Pounds, NP  ?amLODipine (NORVASC) 10 MG tablet Take 1 tablet (10 mg total) by mouth daily. 05/12/21 08/10/21  Gildardo Pounds, NP  ?anastrozole (ARIMIDEX) 1 MG tablet Take 1 tablet (1 mg total) by mouth daily. 09/13/20   Nicholas Lose, MD  ?aspirin 81 MG EC tablet Take 1 tablet (81  mg total) by mouth daily. 10/13/18   Pokhrel, Corrie Mckusick, MD  ?atorvastatin (LIPITOR) 20 MG tablet Take 20 mg by mouth daily.    [provider]  ?Blood Pressure Monitor DEVI Please provide patient with insurance approved blood pressure monitor. I10.0 11/15/20   Gildardo Pounds, NP  ?busPIRone (BUSPAR) 10 MG tablet Take 1 tablet (10 mg total) by mouth 2 (two) times daily. ANXIETY 05/10/21 06/09/21  Charlott Rakes, MD  ?diclofenac Sodium (VOLTAREN) 1 % GEL Apply 4 g topically 4 (four) times daily. 09/09/20   Hans Eden, NP  ?escitalopram (LEXAPRO) 10 MG tablet Take 1 tablet (10 mg total) by mouth daily. DEPRESSION 03/29/21   Gildardo Pounds, NP  ?fluticasone (FLONASE) 50 MCG/ACT nasal spray Place 2 sprays into both nostrils daily. 05/10/21   Charlott Rakes, MD  ?HYDROcodone-acetaminophen (NORCO) 5-325 MG tablet Take 1 tablet by mouth every 6 (six) hours as needed (post op pain). ?Patient not taking: Reported on 04/12/2021 03/10/21   Jenetta Loges, PA-C  ?Insulin Pen Needle (TRUEPLUS PEN NEEDLES) 31G X 8 MM MISC Inject into the skin once nightly.  E11.65 04/20/21   Charlott Rakes, MD  ?LANTUS SOLOSTAR 100 UNIT/ML Solostar Pen Inject 5 Units into the skin at bedtime. 03/31/21   Charlott Rakes, MD  ?lidocaine (LIDODERM) 5 % Place 1 patch onto the skin daily. Remove & Discard patch within 12 hours or as directed by MD 04/12/21   Regan Lemming, MD  ?  losartan (COZAAR) 50 MG tablet Take 1 tablet (50 mg total) by mouth daily. 04/25/21 07/24/21  Charlott Rakes, MD  ?metoprolol succinate (TOPROL-XL) 50 MG 24 hr tablet TAKE ONE TABLET BY MOUTH EVERY DAY *TAKE WITH OR IMMEDIATELY FOLLOWING A MEAL* 04/26/21   Charlott Rakes, MD  ?Misc. Devices MISC Please provide patient with nebulizer mask and tubing. [id number] 12/14/19   Gildardo Pounds, NP  ?Multiple Vitamin (MULTIVITAMIN WITH MINERALS) TABS tablet Take 1 tablet by mouth daily. [place]    [provider]  ?nitroGLYCERIN (NITROSTAT) 0.4 MG SL tablet Place 1 tablet  (0.4 mg total) under the tongue every 5 (five) minutes as needed for chest pain. 03/15/20   Gildardo Pounds, NP  ?Omega-3 Fatty Acids (FISH OIL) 1000 MG CAPS Take 1,000 mg by mouth daily.    [provider]  ?oxyCODONE-acetaminophen (PERCOCET/ROXICET) 5-325 MG tablet Take 1 tablet by mouth every 6 (six) hours as needed for severe pain. ?Patient not taking: Reported on 04/12/2021 03/14/21   Dorie Rank, MD  ?pantoprazole (PROTONIX) 40 MG tablet Take 1 tablet (40 mg total) by mouth daily. For acid reflux 05/12/21 08/10/21  Gildardo Pounds, NP  ?Respiratory Therapy Supplies (NEBULIZER MASK ADULT) MISC 1 Units by Does not apply route daily as needed. ICD 10 J44.9 05/08/21   Gildardo Pounds, NP  ?[facility] 160-4.5 MCG/ACT inhaler INHALE ONE PUFF BY MOUTH TWICE DAILY AS NEEDED FOR RESPIRATORY ISSUES. 04/10/21   Gildardo Pounds, NP  ?Donna Bernard 18 MG/3ML SOPN INJECT 1.8 MG INTO THE SKIN DAILY 03/13/21   Gildardo Pounds, NP  ?   ? ?Allergies    ?Patient has no known allergies.   ? ?Review of Systems   ?Review of Systems  ?Constitutional:  Negative for diaphoresis and fever.  ?HENT:  Negative for ear pain and sore throat.   ?Respiratory:  Positive for cough and shortness of breath. Negative for hemoptysis, sputum production and wheezing.   ?Cardiovascular:  Positive for chest pain. Negative for claudication, syncope and PND.  ?Gastrointestinal:  Negative for abdominal pain and vomiting.  ?Musculoskeletal:  Negative for neck pain.  ?Skin:  Negative for rash.  ?Neurological:  Negative for headaches.  ? ?Physical Exam ?Updated Vital Signs ?BP 122/69   Pulse 65   Temp 98.6 ?F (37 ?C) (Oral)   Resp 15   LMP  (LMP Unknown) Comment: tubal ligation  SpO2 100%  ?Physical Exam ?Vitals and nursing note reviewed.  ?Constitutional:   ?   General: She is not in acute distress. ?   Appearance: She is well-developed. She is not ill-appearing.  ?HENT:  ?   Head: Normocephalic and atraumatic.  ?Eyes:  ?   Extraocular Movements: Extraocular  movements intact.  ?   Conjunctiva/sclera: Conjunctivae normal.  ?   Pupils: Pupils are equal, round, and reactive to light.  ?Cardiovascular:  ?   Rate and Rhythm: Normal rate and regular rhythm.  ?   Pulses: Normal pulses.  ?   Heart sounds: Normal heart sounds. No murmur heard. ?Pulmonary:  ?   Effort: Pulmonary effort is normal. No respiratory distress.  ?   Breath sounds: Wheezing present.  ?Abdominal:  ?   Palpations: Abdomen is soft.  ?   Tenderness: There is no abdominal tenderness.  ?Musculoskeletal:     ?   General: No swelling. Normal range of motion.  ?   Cervical back: Normal range of motion and neck supple.  ?   Right lower leg: No edema.  ?  Left lower leg: No edema.  ?Skin: ?   General: Skin is warm and dry.  ?   Capillary Refill: Capillary refill takes less than 2 seconds.  ?Neurological:  ?   General: No focal deficit present.  ?   Mental Status: She is alert.  ?Psychiatric:     ?   Mood and Affect: Mood normal.  ? ? ?ED Results / Procedures / Treatments   ?Labs ?(all labs ordered are listed, but only abnormal results are displayed) ?Labs Reviewed  ?BASIC METABOLIC PANEL - Abnormal; Notable for the following components:  ?    Result Value  ? Glucose, Bld 120 (*)   ? BUN 32 (*)   ? Creatinine, Ser 2.66 (*)   ? GFR, Estimated 18 (*)   ? All other components within normal limits  ?CBC WITH DIFFERENTIAL/PLATELET - Abnormal; Notable for the following components:  ? WBC 2.7 (*)   ? RBC 3.68 (*)   ? Hemoglobin 11.0 (*)   ? HCT 35.2 (*)   ? Neutro Abs 1.5 (*)   ? All other components within normal limits  ?RESP PANEL BY RT-PCR (FLU A&B, COVID) ARPGX2  ?BRAIN NATRIURETIC PEPTIDE  ?TROPONIN I (HIGH SENSITIVITY)  ?TROPONIN I (HIGH SENSITIVITY)  ? ? ?EKG ?EKG Interpretation ? ?Date/Time:  Wednesday May 17 2021 15:58:10 EDT ?Ventricular Rate:  83 ?PR Interval:  150 ?QRS Duration: 82 ?QT Interval:  354 ?QTC Calculation: 415 ?R Axis:   -5 ?Text Interpretation: Normal sinus rhythm Possible Anterior infarct ,  age undetermined Abnormal ECG When compared with ECG of 17-May-2021 12:16, PREVIOUS ECG IS PRESENT Confirmed by Lennice Sites [phone]) on 05/17/2021 10:01:30 PM ? ?Radiology ?DG Chest 2 View ? ?Result Date: 05/17/2021

## 2021-05-28 ENCOUNTER — Other Ambulatory Visit: Payer: Self-pay | Admitting: Nurse Practitioner

## 2021-05-28 DIAGNOSIS — N1832 Chronic kidney disease, stage 3b: Secondary | ICD-10-CM

## 2021-05-29 IMAGING — CT CT ABDOMEN AND PELVIS WITH CONTRAST
2 of 5 series · 14 of 46 positions shown, 16 images · IV contrast (OMNIPAQUE)
Comparison: December 18, 2017

CLINICAL DATA: Abdominal pain and distension. History of breast
carcinoma

EXAM:
CT ABDOMEN AND PELVIS WITH CONTRAST
TECHNIQUE: Multidetector CT imaging of the abdomen and pelvis was performed
using the standard protocol following bolus administration of
intravenous contrast.
CONTRAST:  75mL OMNIPAQUE IOHEXOL 300 MG/ML  SOLN

[Series 2: axial st · axial · 0.73mm/px · z∈[-388,-28]mm · 11 of 85 slices shown, 13 images]
[im 7/85  soft-tissue]
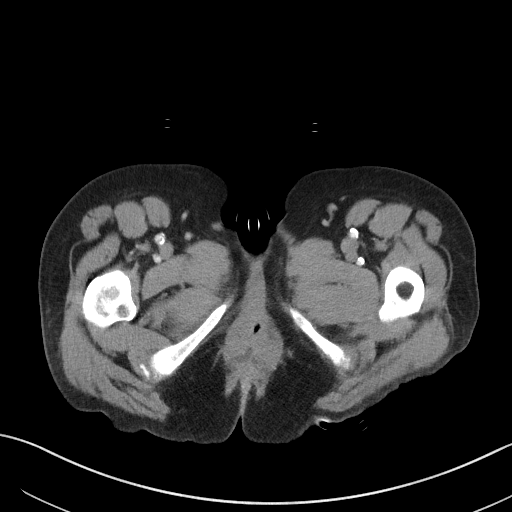
[im 7/85  bone]
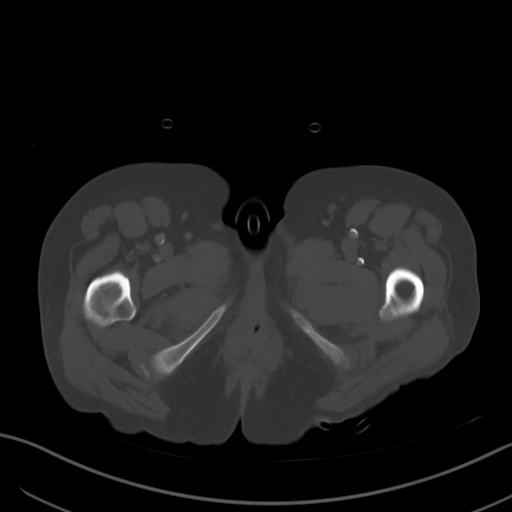
[im 13/85  soft-tissue]
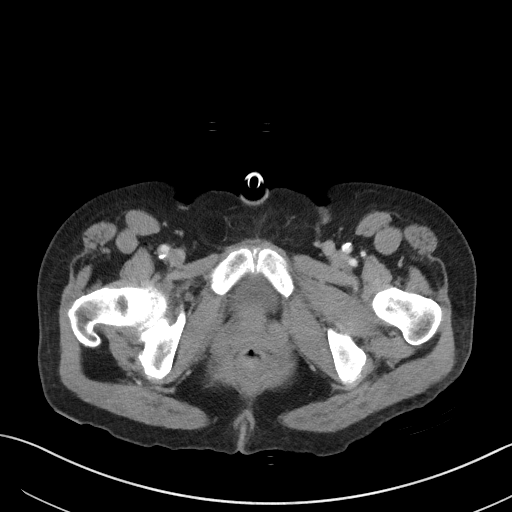
[im 19/85  soft-tissue]
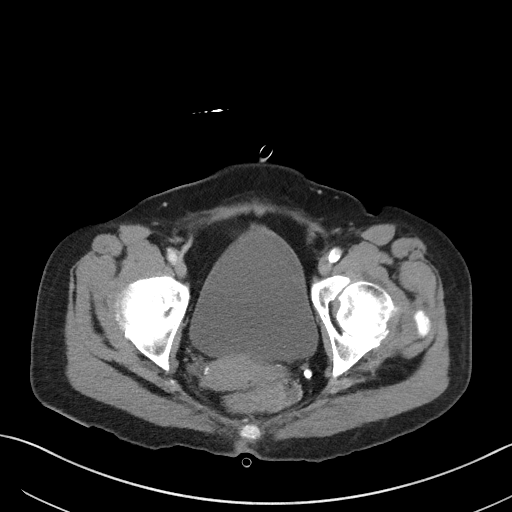
[im 31/85  soft-tissue]
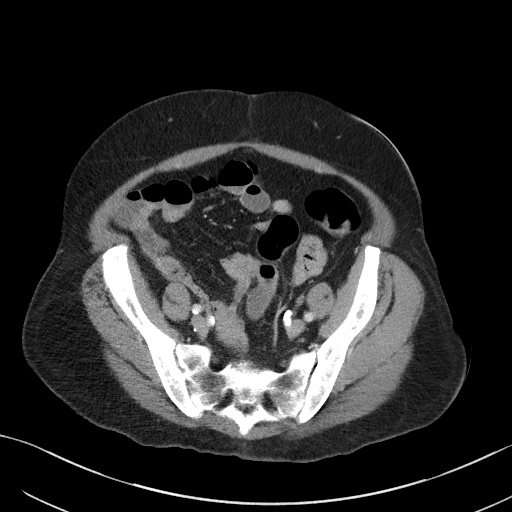
[im 37/85  soft-tissue]
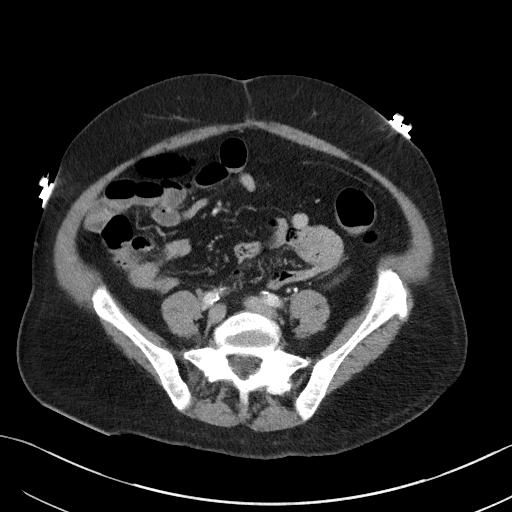
[im 43/85  soft-tissue]
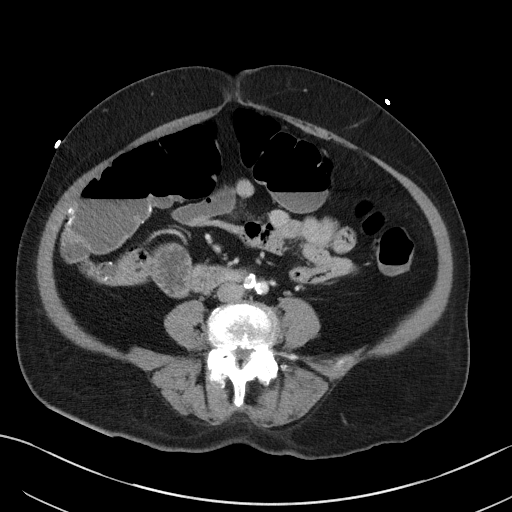
[im 49/85  soft-tissue]
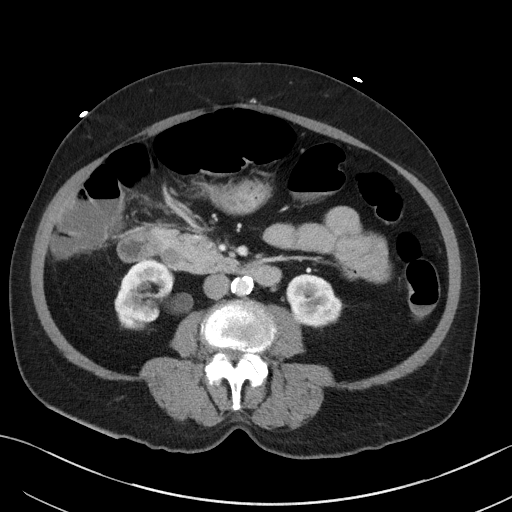
[im 55/85  soft-tissue]
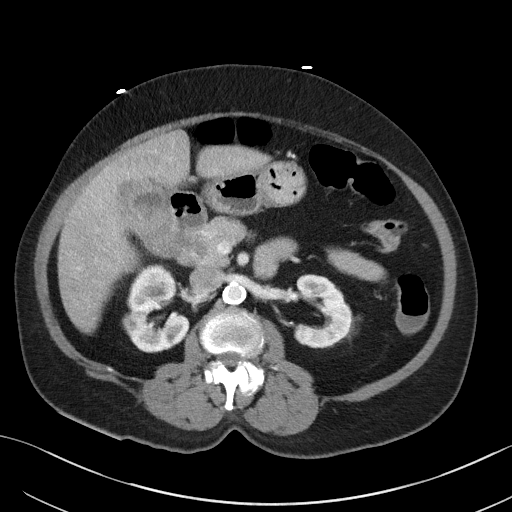
[im 67/85  soft-tissue]
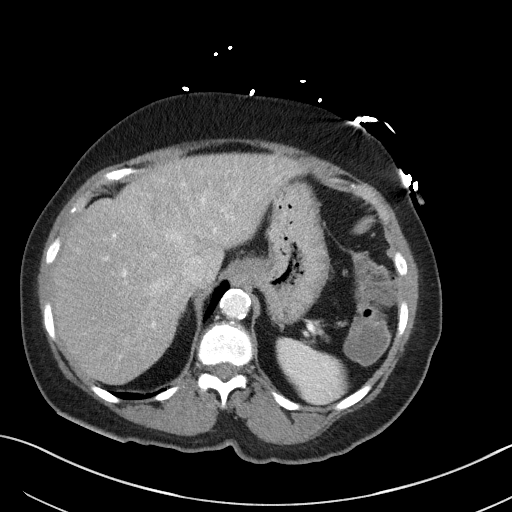
[im 67/85  bone]
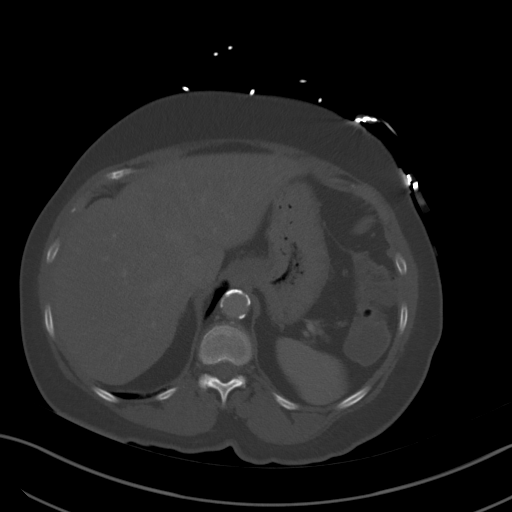
[im 73/85  soft-tissue]
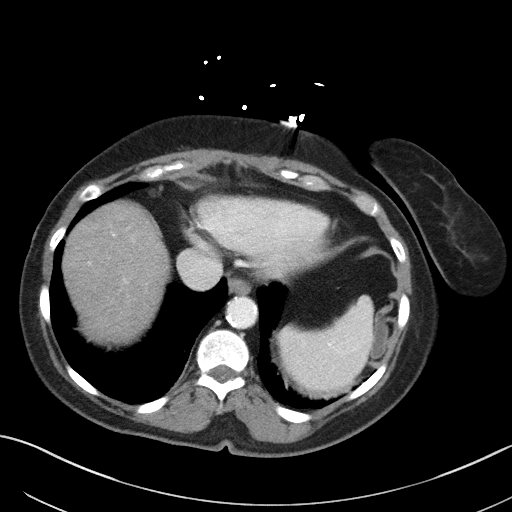
[im 79/85  soft-tissue]
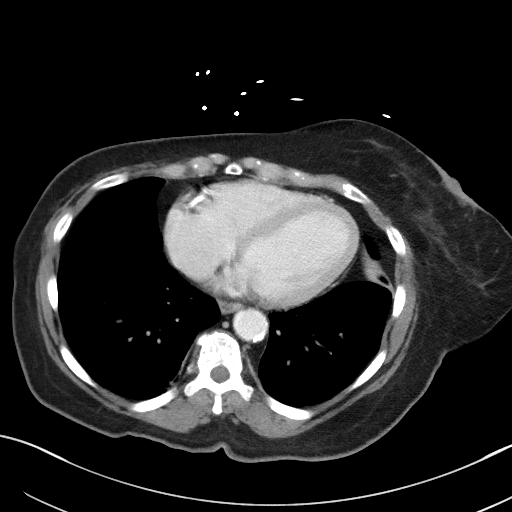

[Series 4: coronal st · coronal · 0.71mm/px · 3 of 101 slices shown]
[im 34/101  soft-tissue]
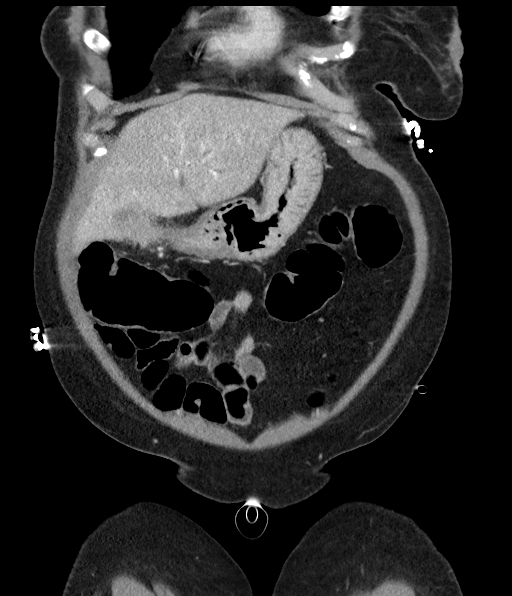
[im 45/101  soft-tissue]
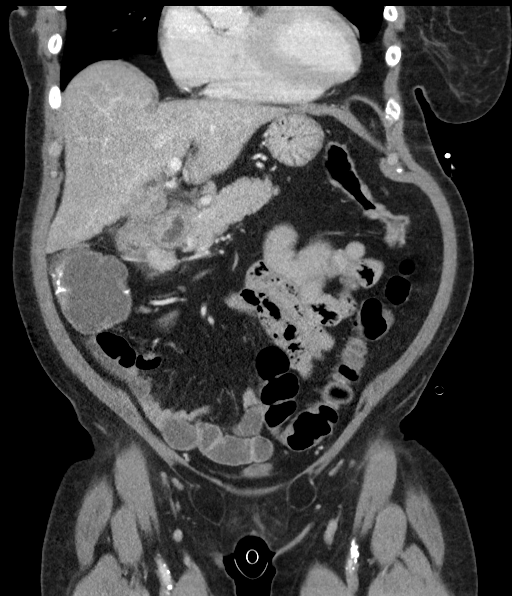
[im 56/101  soft-tissue]
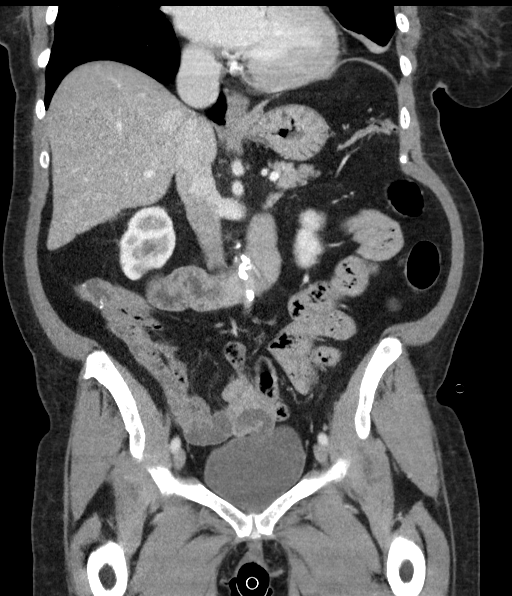

[14 of 46 positions shown; findings below may reference images not displayed]

FINDINGS: Lower chest: There is bibasilar atelectasis. There is mild
bronchiectatic change in the lung bases as well. There are foci of
coronary artery calcification.

Hepatobiliary: There is hepatic steatosis. No focal liver lesions
are evident. There is cholelithiasis. The gallbladder wall is
diffusely thickened and edematous. There is loss of fat plane
between the gallbladder and the pylorus/first portion of the
duodenum. There is no air within the gallbladder. There is no
appreciable intrahepatic biliary duct dilatation. There is
dilatation of the distal common bile duct, measuring 1.2 cm. No
biliary duct mass or calculus evident.

Pancreas: No pancreatic mass or inflammatory focus evident. No
pancreatic duct dilatation.

Spleen: No splenic lesions are evident.

Adrenals/Urinary Tract: Adrenals bilaterally appear normal. There
are subcentimeter cysts in the right kidney. There is an extrarenal
pelvis on the right, an anatomic variant. There is no hydronephrosis
on either side. There is or ureteral calculus on either side.
Urinary bladder wall thickness is within normal limits.

Stomach/Bowel: As noted above, there is loss of fat plane between
the gallbladder and the pylorus/first portion of the duodenum. There
is wall thickening in the distal stomach and proximal duodenum.
There is no other appreciable bowel wall thickening. The patient has
had a partial right colectomy with anastomosis patent in this area.
Terminal ileum appears unremarkable. No evident bowel obstruction.
There is no free air or portal venous air.

Vascular/Lymphatic: There is aortic and iliac artery
atherosclerosis. No aneurysm evident. Major mesenteric arterial
vessels appear patent, although there is severe narrowing at the
origin of the right renal artery and extensive calcification with
severe narrowing in the proximal left renal artery. No adenopathy is
evident in the abdomen or pelvis.

Reproductive: There are calcifications in the uterus, felt to be
indicative of leiomyomatous change. The uterus is slightly canted
toward the right. No pelvic mass is demonstrable.

Other: Appendix absent. There is no abscess or ascites evident in
the abdomen or pelvis. There is fat in each inguinal ring.

Musculoskeletal: There are no blastic or lytic bone lesions. There
is bony hypertrophy at L4 and diffuse disc protrusion. These
findings in concert leads spinal stenosis. Similar changes are noted
at L3-4 with spinal stenosis also present at L3-4. No blastic or
lytic bone lesions. No intramuscular lesions are evident.
IMPRESSION: 1. There is cholelithiasis with diffuse gallbladder wall thickening
and edema. There is loss of fat plane between the gallbladder and
the distal stomach/proximal duodenum. This appearance raises concern
for cholecysto-enteric fistula. No air seen in the gallbladder.

2. There is dilatation of the common hepatic and common bile ducts.
No intrahepatic biliary duct dilatation seen. No biliary duct mass
or calculus evident. A mass at the ampulla could cause these
findings. From an imaging standpoint, MRCP would be the optimum
study of choice to further evaluate in this regard.

3.  Hepatic steatosis.  No focal liver lesions appreciable.

4. No bowel obstruction. No abscess in the abdomen or pelvis. The
patient is status post partial right colectomy with anastomosis
patent.

5. Spinal stenosis at L3-4 and L4-5 due to disc protrusions and bony
hypertrophy at these levels.

6. Extensive calcification and narrowing in the proximal renal
arteries bilaterally. Question whether patient is hypertensive;
there may well be renovascular hypertension given this appearance.

7.  Extensive aortic and iliac artery atherosclerosis.

8. Calcifications in the uterus consistent with leiomyomatous
change.

## 2021-05-29 IMAGING — CR DG ABDOMEN ACUTE W/ 1V CHEST
3 series · 3 of 3 positions shown · non-contrast
Comparison: Radiographs of April 10, 2018 and February 27, 2015.

CLINICAL DATA: Acute generalized abdominal pain.

EXAM:
DG ABDOMEN ACUTE W/ 1V CHEST

[x chest ap]
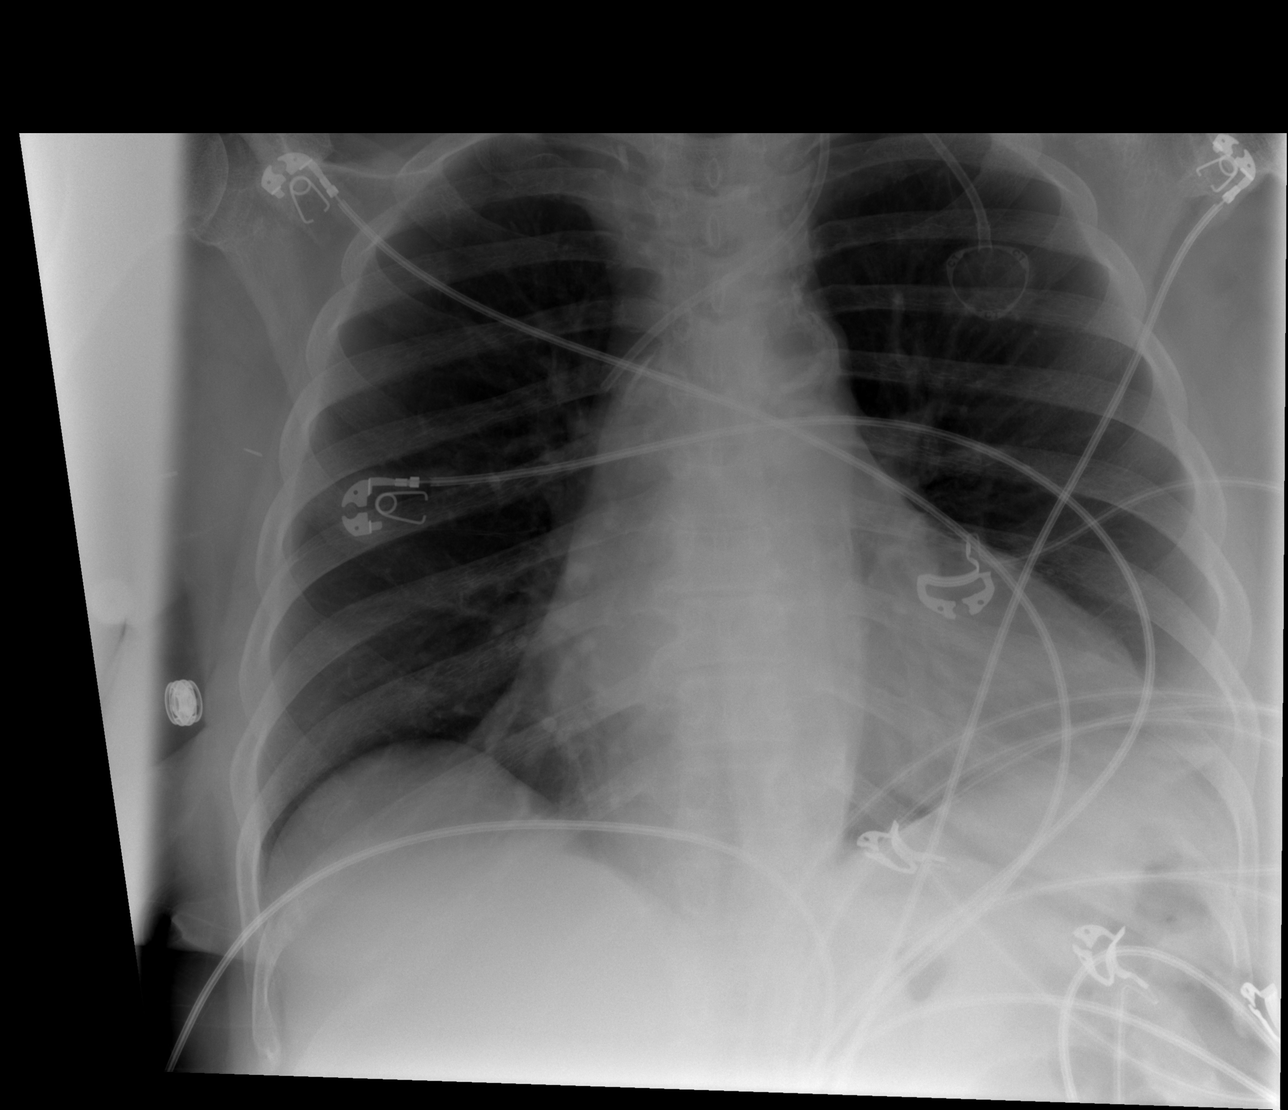

[x abdomen supine]
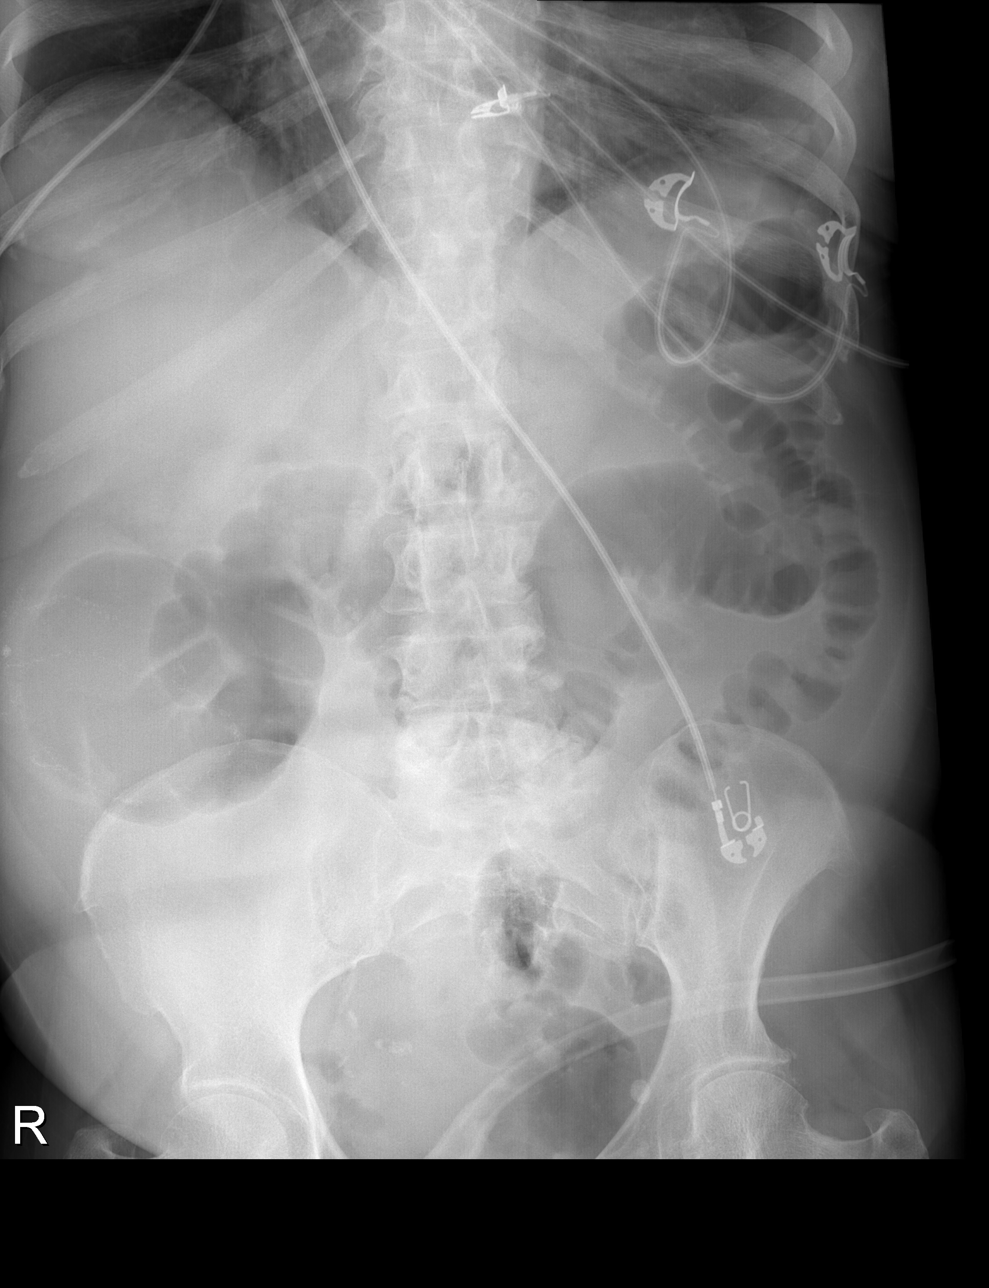

[w abdomen decub]
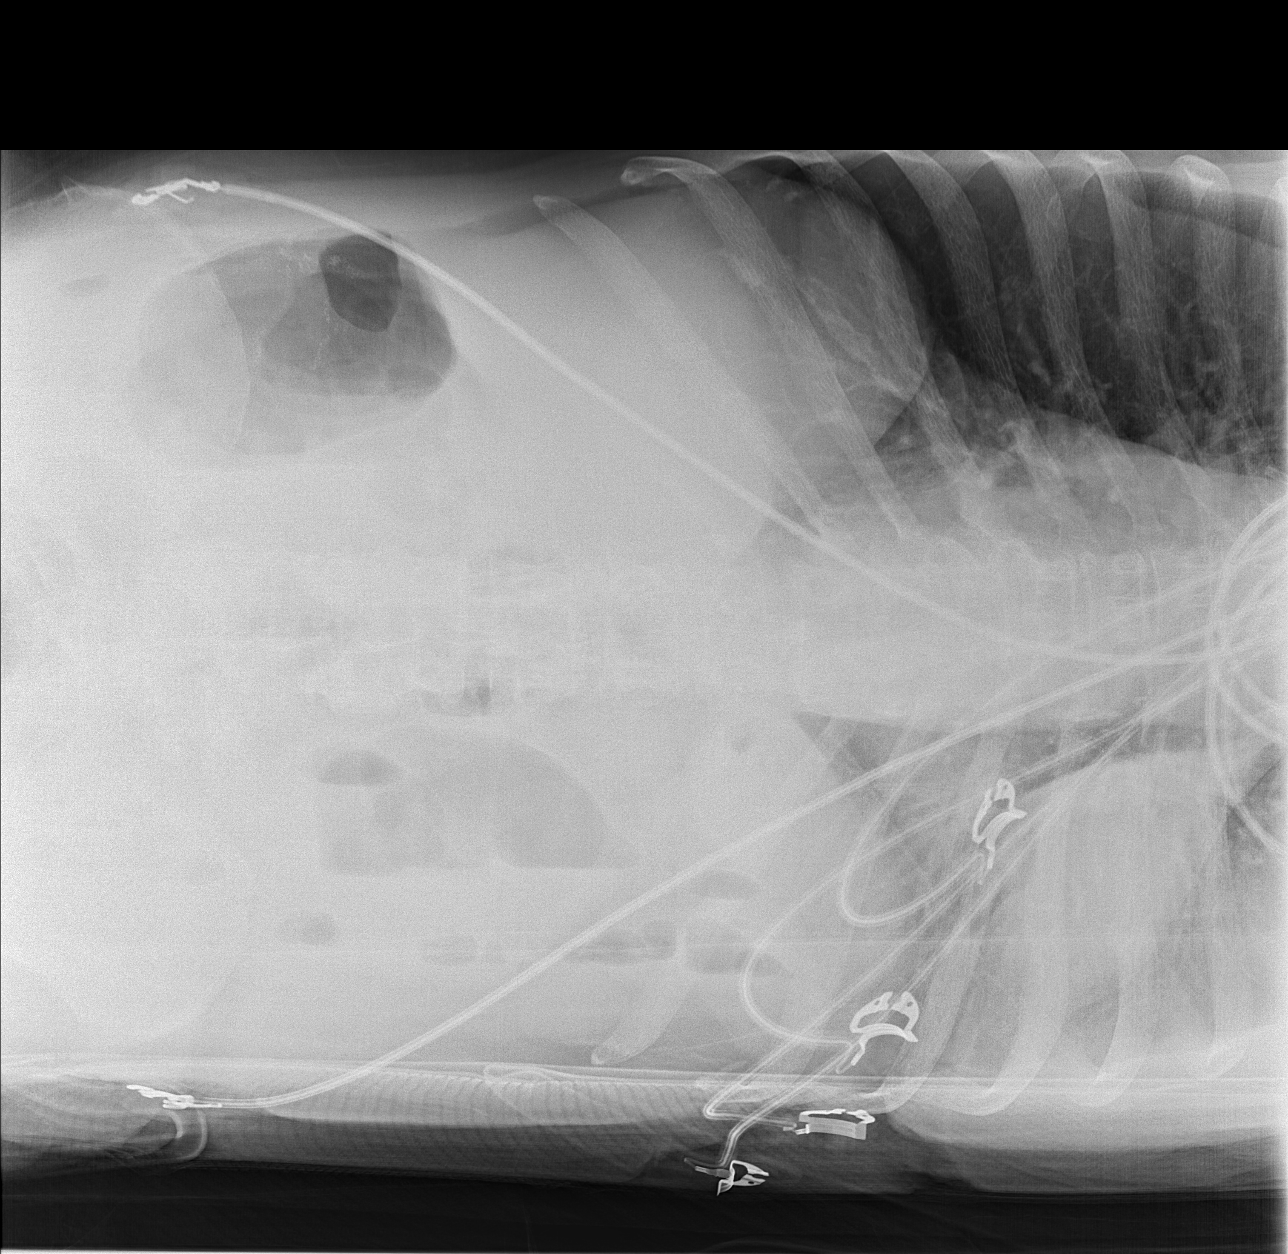

[3 of 3 positions shown; findings below may reference images not displayed]

FINDINGS: There is no evidence of dilated bowel loops or free intraperitoneal
air. No radiopaque calculi or other significant radiographic
abnormality is seen. Stable cardiomediastinal silhouette is noted.
Atherosclerosis of thoracic aorta is noted. Left internal jugular
Port-A-Cath is noted in grossly good position. Both lungs are clear.
IMPRESSION: No evidence of bowel obstruction or ileus. No acute cardiopulmonary
disease.

Aortic Atherosclerosis ([id number]).

## 2021-05-29 IMAGING — MR MRI ABDOMEN (MRCP)
5 of 9 series · 21 of 48 positions shown · non-contrast
Comparison: CT abdomen/pelvis dated 09/18/2018

CLINICAL DATA: Abdominal pain/distension, Hemoccult-positive,
history of breast cancer status post right mastectomy

EXAM:
MRI ABDOMEN WITHOUT CONTRAST  (INCLUDING MRCP)
TECHNIQUE: Multiplanar multisequence MR imaging of the abdomen was performed.
Heavily T2-weighted images of the biliary and pancreatic ducts were
obtained, and three-dimensional MRCP images were rendered by post
processing.

[Series 3: T2 fat-sat · axial · 5.0mm · 0.86mm/px · z∈[-70,+200]mm · 5 of 55 slices shown]
[im 1/55]
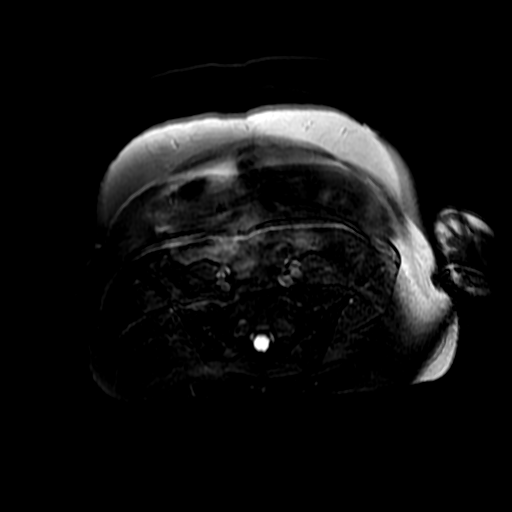
[im 14/55]
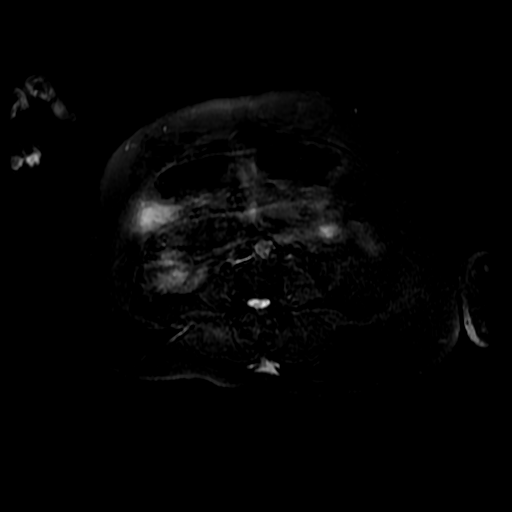
[im 28/55]
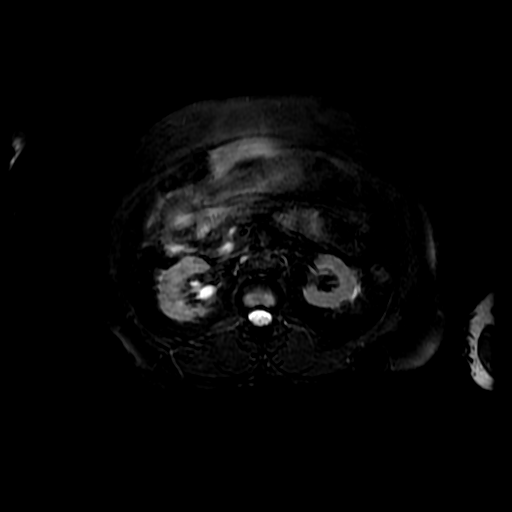
[im 41/55]
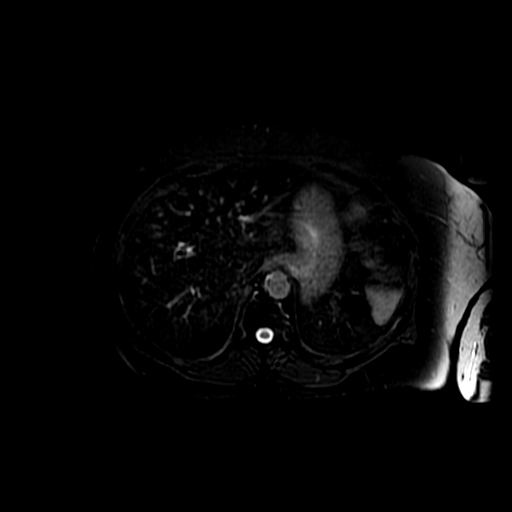
[im 55/55]
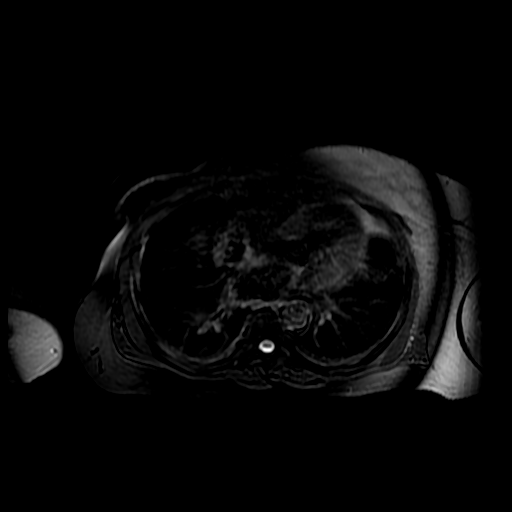

[Series 5: MRCP · coronal · 2.0mm · 0.70mm/px · 5 of 55 slices shown]
[im 1/55]
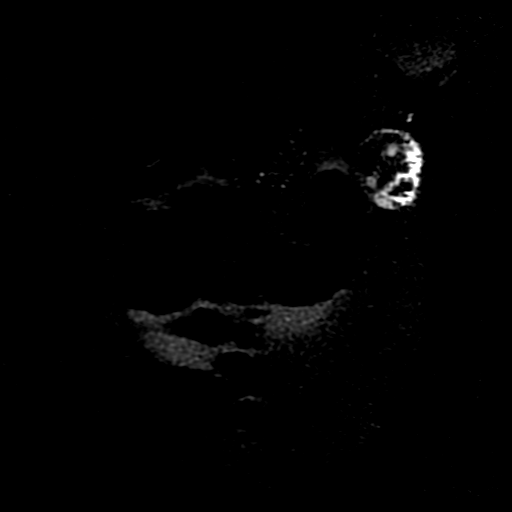
[im 14/55]
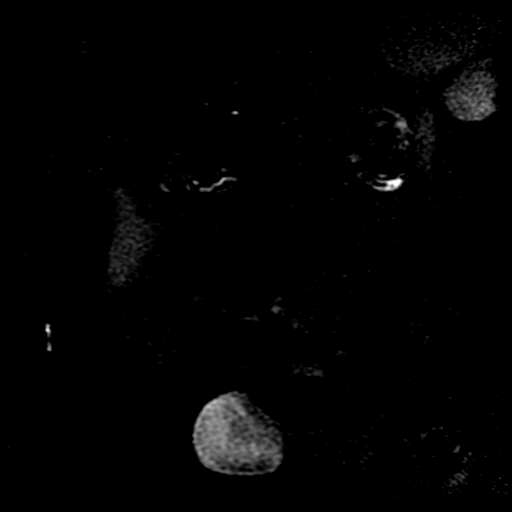
[im 28/55]
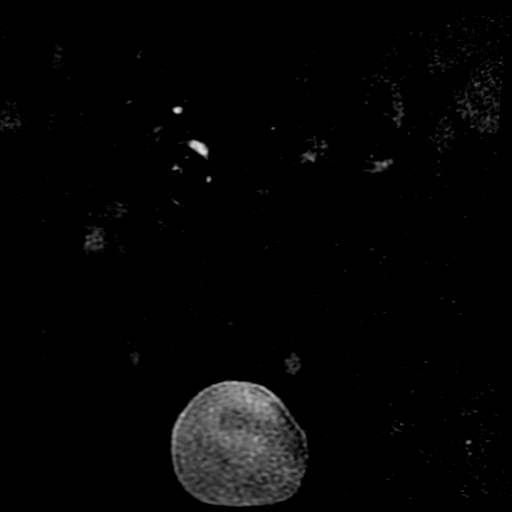
[im 41/55]
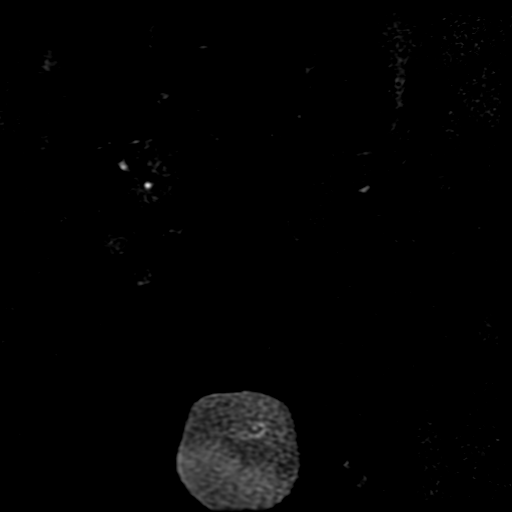
[im 55/55]
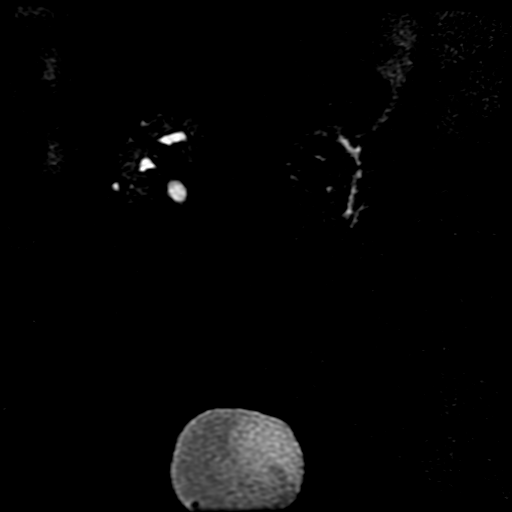

[Series 6: DWI b500 · axial · 6.0mm · 1.76mm/px · z∈[+41,+212]mm · 4 of 46 slices shown]
[im 1/46]
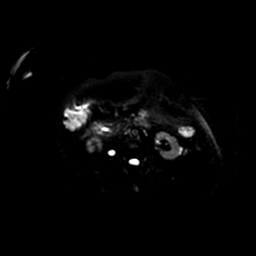
[im 16/46]
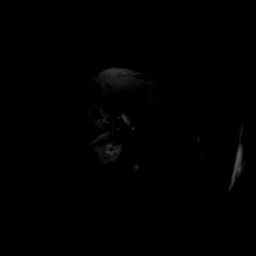
[im 31/46]
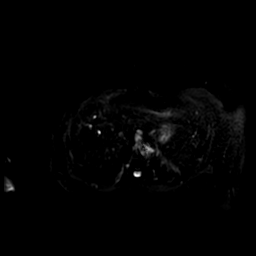
[im 46/46]
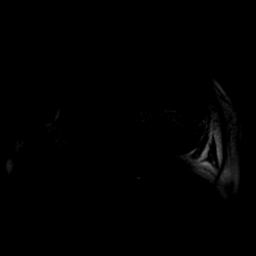

[Series 7: bSSFP fat-sat · coronal · 5.0mm · 0.78mm/px · 4 of 43 slices shown]
[im 1/43]
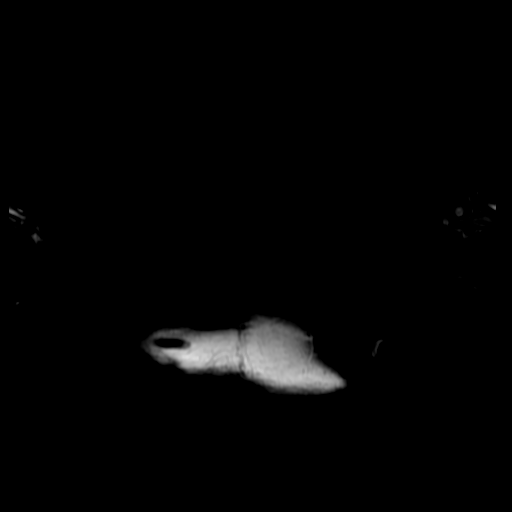
[im 15/43]
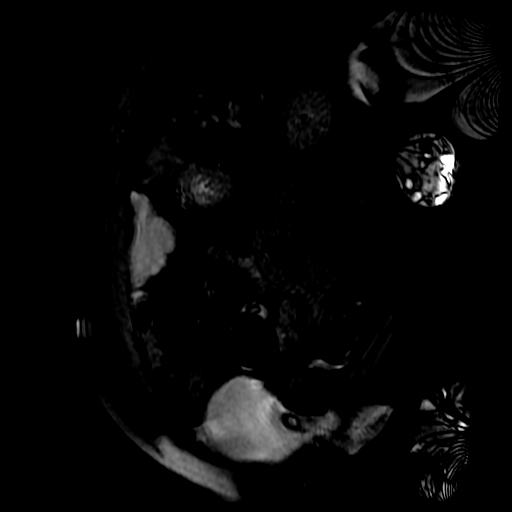
[im 29/43]
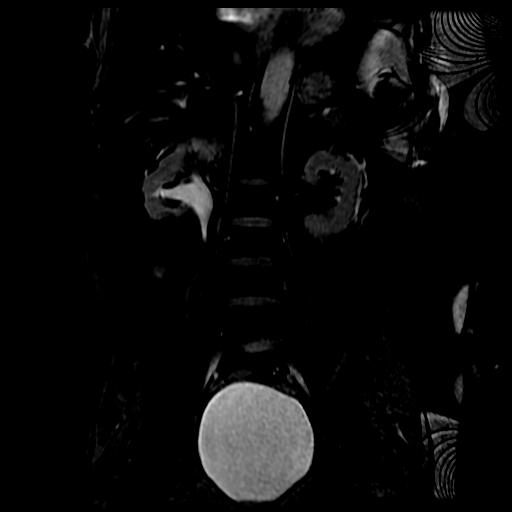
[im 43/43]
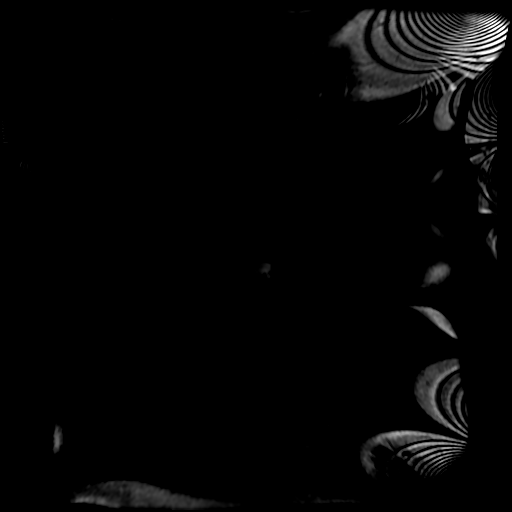

[Series 8: T2 · axial · 5.0mm · 0.86mm/px · z∈[-61,+69]mm · 3 of 54 slices shown]
[im 1/54]
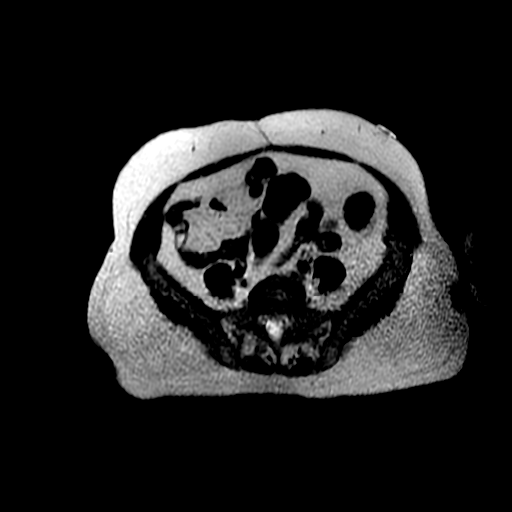
[im 14/54]
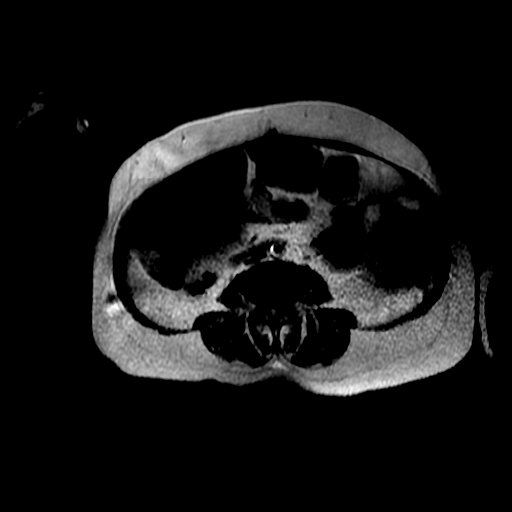
[im 27/54]
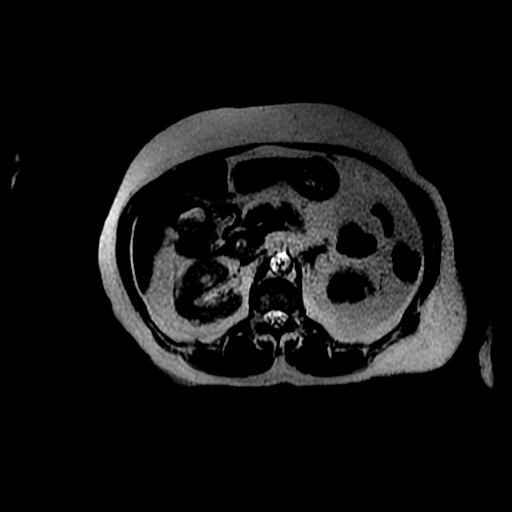

[21 of 48 positions shown; findings below may reference images not displayed]

FINDINGS: Motion degraded images.

Lower chest: Lung bases are clear.

Hepatobiliary: Suspected iron deposition in the liver. No focal
hepatic lesion is seen.

Cholelithiasis with gallbladder wall thickening/inflammatory
changes. Associated 1.9 x 2.8 cm cystic lesion along the wall of the
gallbladder (series 8/image 26) favors localized perforation.

No intrahepatic or extrahepatic ductal dilatation.

Pancreas:  Within normal limits.

Spleen:  Within normal limits.

Adrenals/Urinary Tract:  Adrenal glands are within normal limits.

8 mm posterior right upper pole renal cyst with mild right renal
cortical scarring. Left kidney is within normal limits. No
hydronephrosis.

Stomach/Bowel: Stomach is within normal limits.

Proximal duodenum is mildly thick-walled and adherent to the
gallbladder (series 8/image 27). While direct fistulous
communication is not demonstrated on MR, this may reflect
involvement of the inflammatory process.

Visualized bowel is otherwise unremarkable.

Vascular/Lymphatic:  No evidence of abdominal aortic aneurysm.

No suspicious abdominal lymphadenopathy.

Other:  No abdominal ascites.

Musculoskeletal: No focal osseous lesions.
IMPRESSION: Cholelithiasis with acute cholecystitis. Associated 2.8 cm cystic
lesion along the wall the gallbladder favors localized perforation.

Adherent proximal duodenum with mild wall thickening suggest
involvement with the right upper quadrant inflammatory process.
Direct fistulous communication is not demonstrated on MR.

Additional ancillary findings as above.

## 2021-05-29 NOTE — Telephone Encounter (Signed)
Requested Prescriptions  ?Pending Prescriptions Disp Refills  ?? VICTOZA 18 MG/3ML SOPN [Pharmacy Med Name: Victoza 3-Pak 0.6 mg/0.1 mL (18 mg/3 mL) subcutaneous pen injector] 9 mL 2  ?  Sig: INJECT 1.8 MG INTO THE SKIN DAILY  ?  ? Endocrinology:  Diabetes - GLP-1 Receptor Agonists Passed - 05/28/2021  8:00 AM  ?  ?  Passed - HBA1C is between 0 and 7.9 and within 180 days  ?  Hgb A1c MFr Bld  ?Date Value Ref Range Status  ?02/08/2021 5.9 (H) 4.8 - 5.6 % Final  ?  Comment:  ?  (NOTE) ?Pre diabetes:          5.7%-6.4% ? ?Diabetes:              >6.4% ? ?Glycemic control for   <7.0% ?adults with diabetes ?  ?   ?  ?  Passed - Valid encounter within last 6 months  ?  Recent Outpatient Visits   ?      ? 2 weeks ago Type 2 diabetes mellitus with complication, without long-term current use of insulin ([place])  ? [place], [place] W, NP  ? 1 month ago Essential hypertension  ? [place], RPH-CPP  ? 2 months ago Essential hypertension  ? [place], [place] W, NP  ? 4 months ago Essential hypertension  ? [place], [place] W, NP  ? 5 months ago Essential hypertension  ? [place], Vernia Buff, NP  ?  ?  ? ?  ?  ?  ? ?

## 2021-05-30 IMAGING — US ULTRASOUND ABDOMEN LIMITED
1 series · 14 of 25 positions shown · non-contrast
Comparison: MRI September 18, 2018, CT September 18, 2018.

CLINICAL DATA: Cholecystitis

EXAM:
ULTRASOUND ABDOMEN LIMITED RIGHT UPPER QUADRANT

[Series 1: ultrasound abdomen limited · 62 acquisitions, 14 frames shown]
[im 1/62]
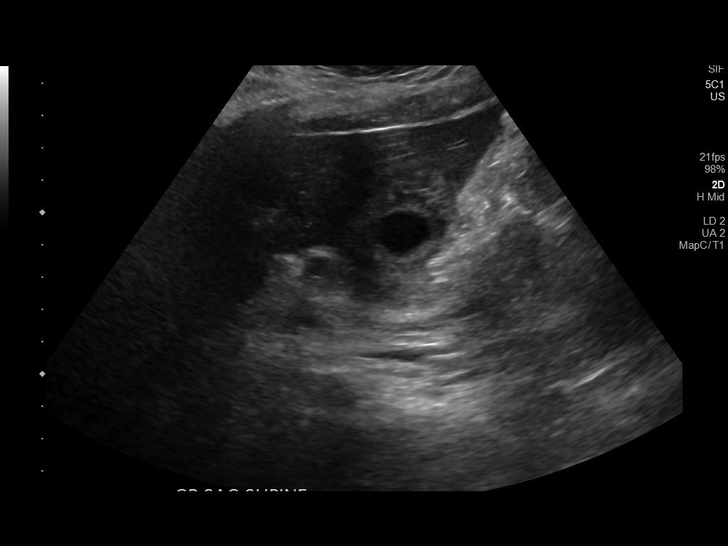
[im 6/62]
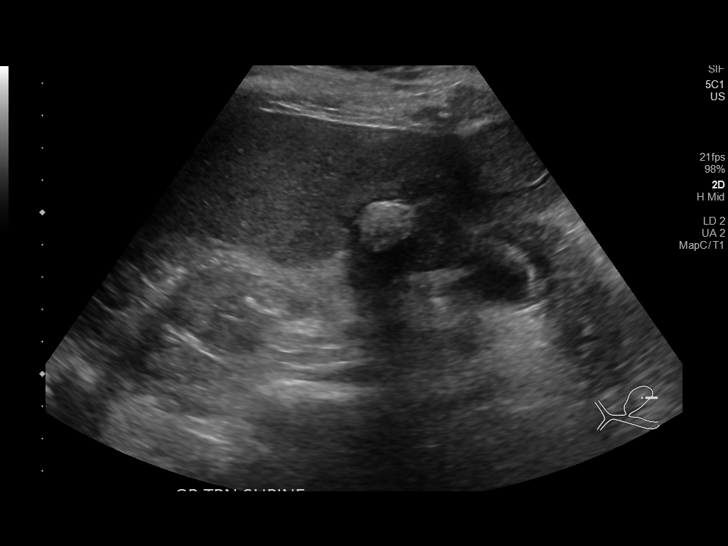
[im 11/62]
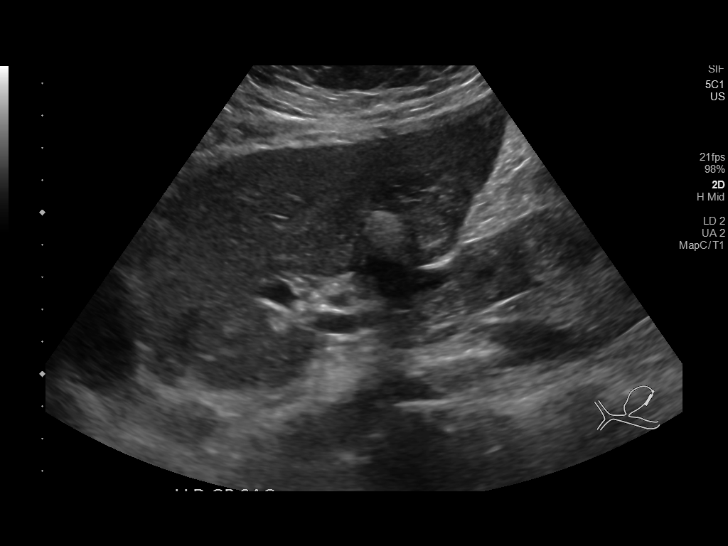
[im 16/62]
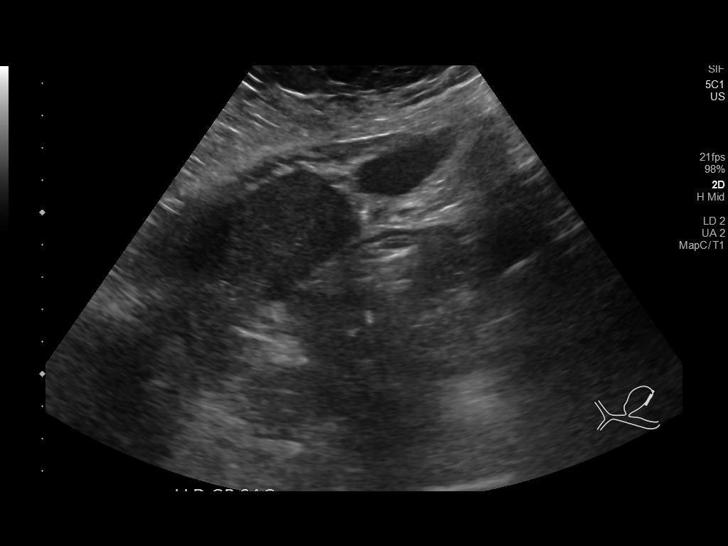
[im 21/62]
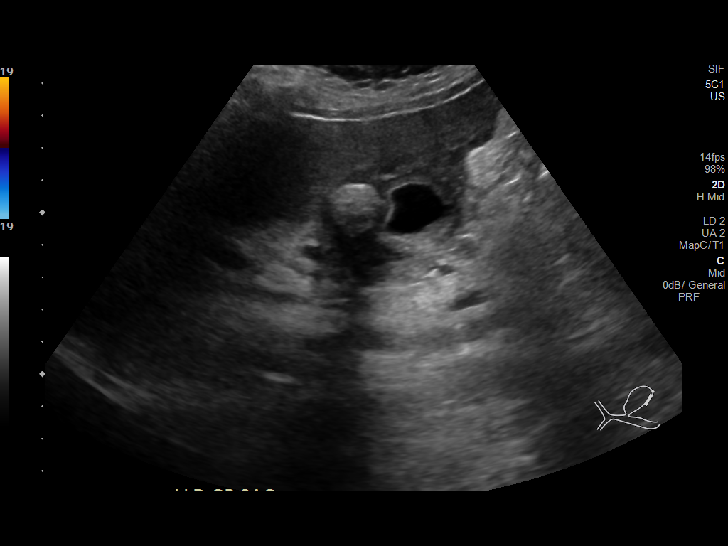
[im 23/62]
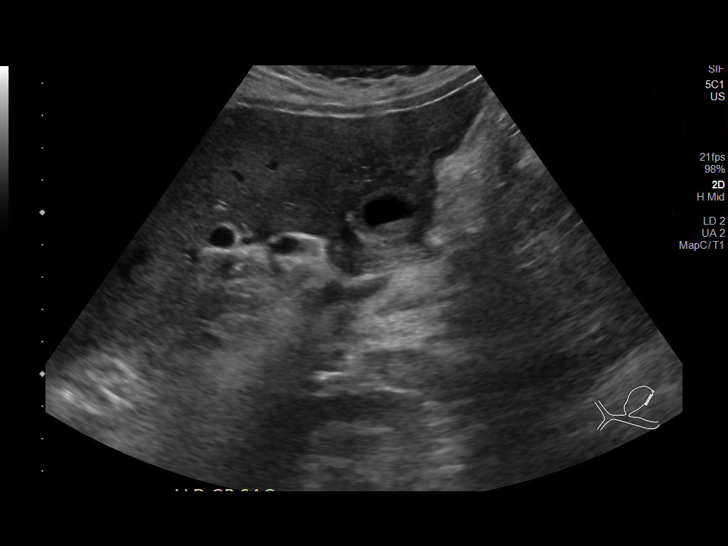
[im 28/62]
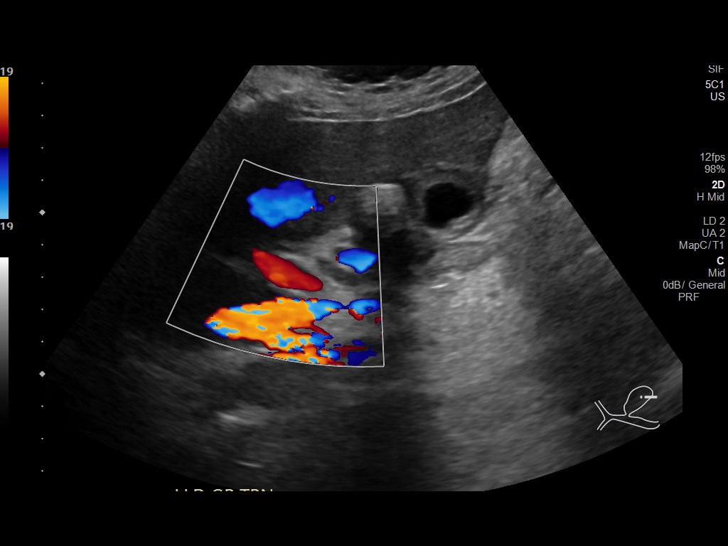
[im 34/62]
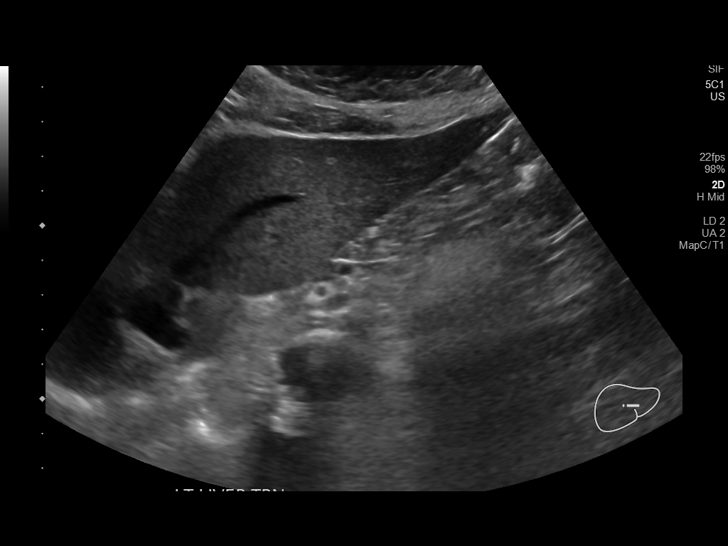
[im 39/62]
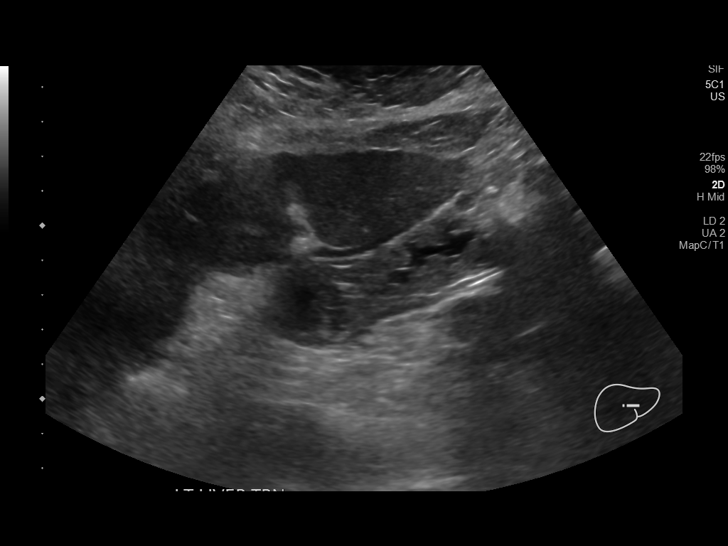
[im 41/62]
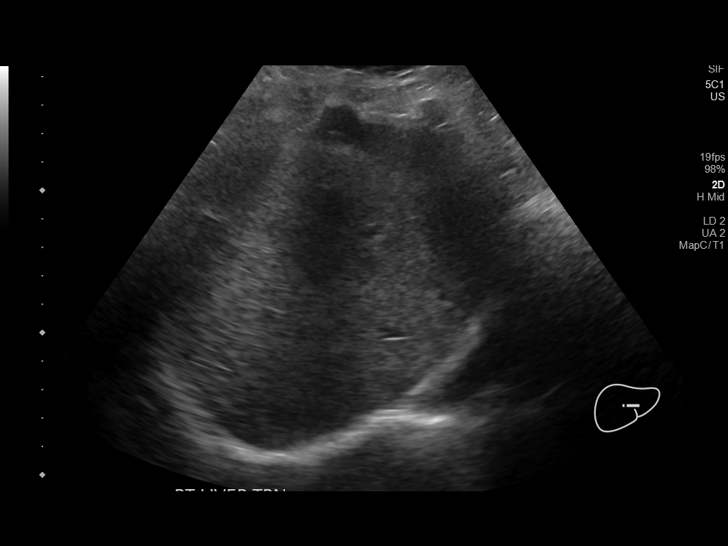
[im 46/62]
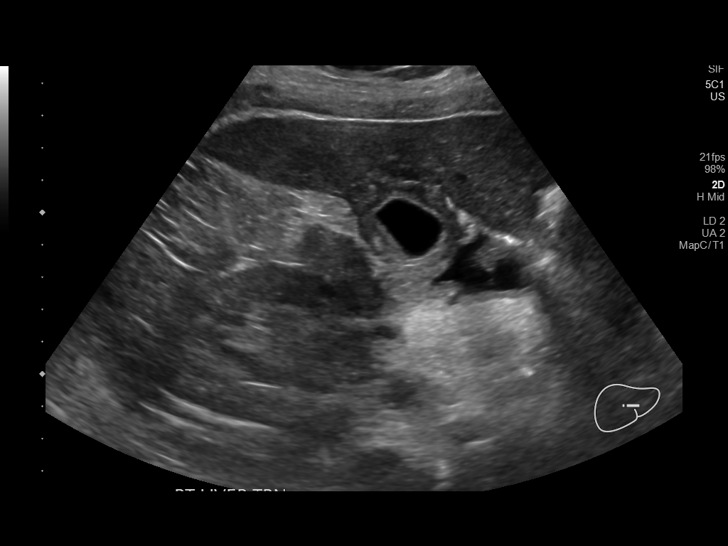
[im 51/62]
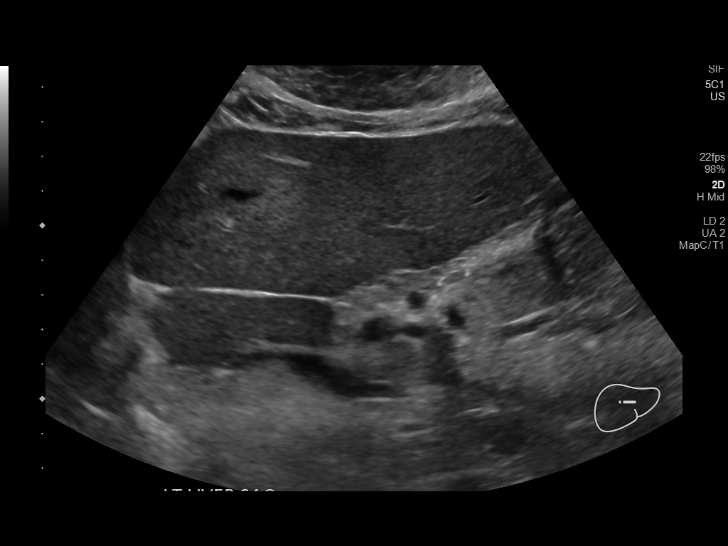
[im 56/62]
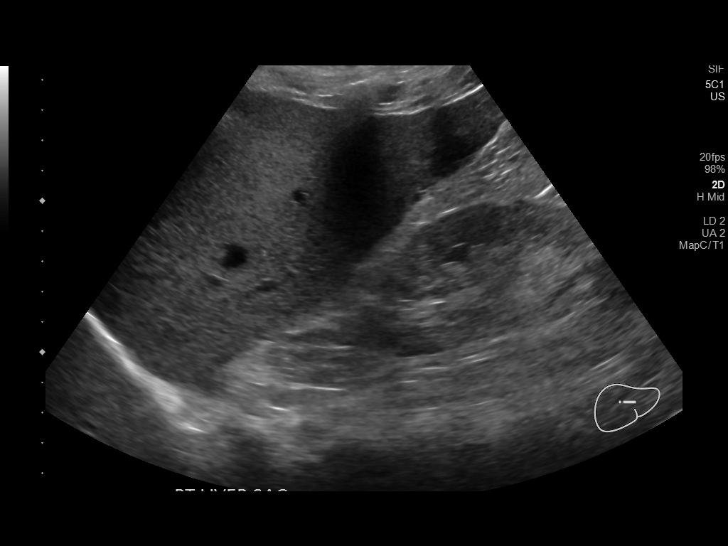
[im 62/62]
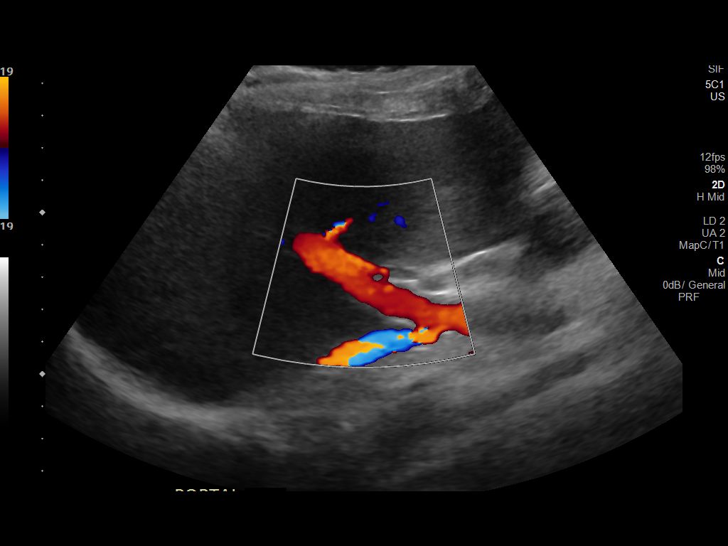

[14 of 25 positions shown; findings below may reference images not displayed]

FINDINGS: Gallbladder:

There is a large echogenic mass seen on the peripheral outer wall of
the gallbladder with posterior shadowing, likely calcified mass as
seen on prior MRI and CT exam. This measures 1.4 by 1.5 x 1.2 cm.
There is diffuse gallbladder wall thickening measuring 6 mm. No
definite pericholecystic fluid is seen. No sonographic Murphy sign
is seen.

Common bile duct:

Diameter: 4 mm.

Liver:

No focal lesion identified. Within normal limits in parenchymal
echogenicity. Portal vein is patent on color Doppler imaging with
normal direction of blood flow towards the liver.

Other: None.
IMPRESSION: 1. Large calcified mass seen on the peripheral outer wall of the
gallbladder measuring 1.4 x 1.5 x 1.2 cm with diffuse thickened
gallbladder wall. Findings suggestive of cholecystitis.
2. Normal appearing liver.
3. No extrahepatic biliary ductal dilatation.

## 2021-06-12 IMAGING — US ULTRASOUND ABDOMEN LIMITED
1 series · 14 of 25 positions shown · non-contrast
Comparison: Ultrasound right upper quadrant September 19, 2018; CT
abdomen and pelvis September 18, 2018 and abdominal MRI September 18, 2018

CLINICAL DATA: Severe abdominal pain

EXAM:
ULTRASOUND ABDOMEN LIMITED RIGHT UPPER QUADRANT

[Series 1: ultrasound abdomen limited · 14 of 87 slices shown]
[im 1/87]
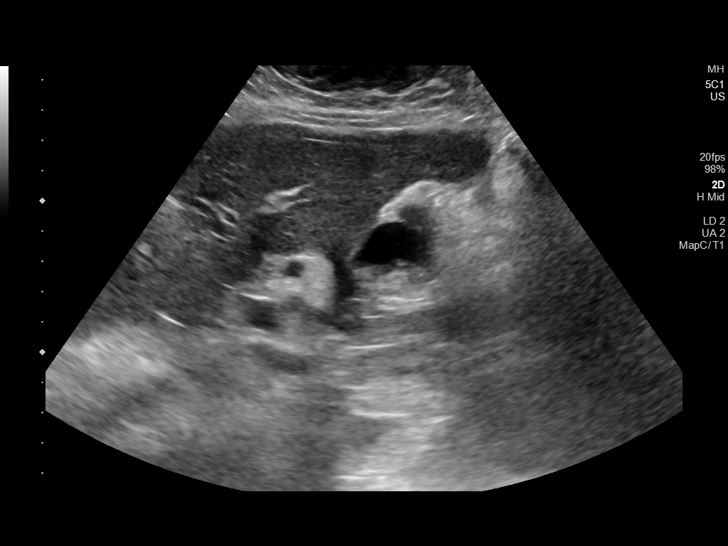
[im 8/87]
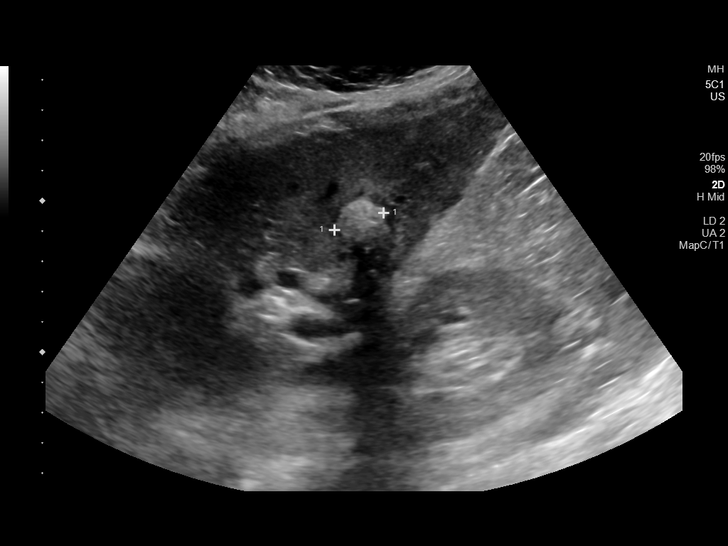
[im 15/87]
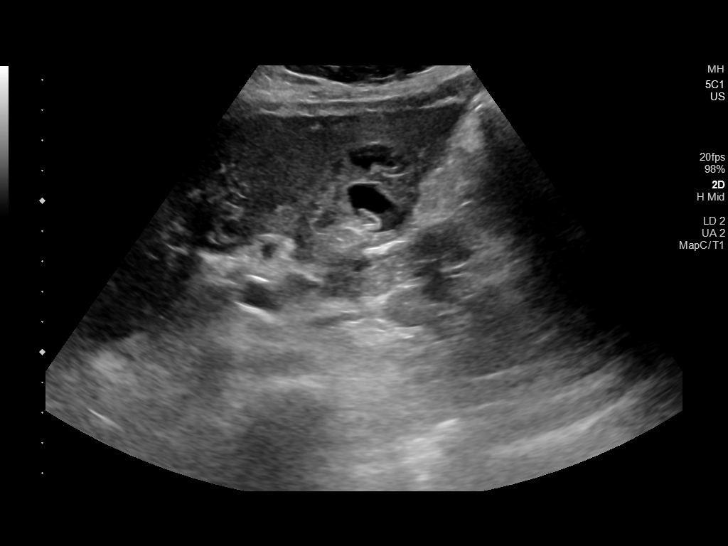
[im 22/87]
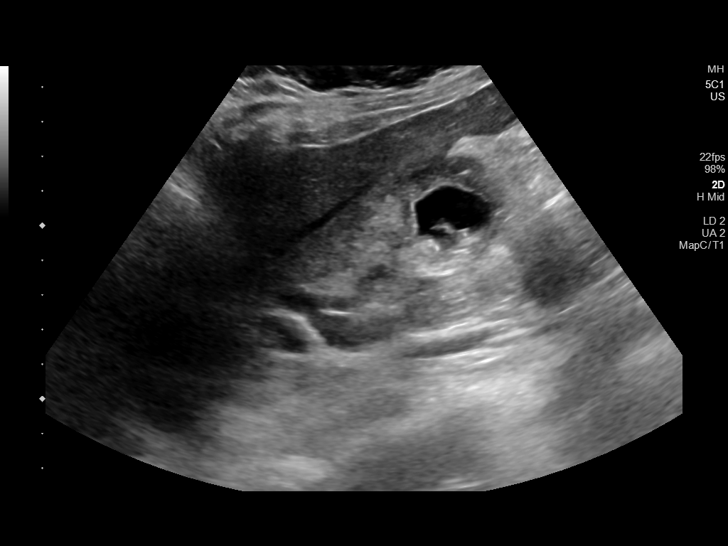
[im 29/87]
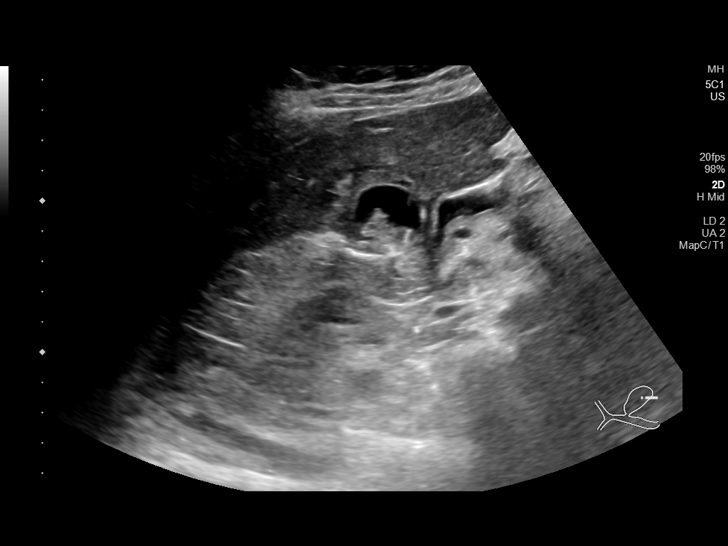
[im 33/87]
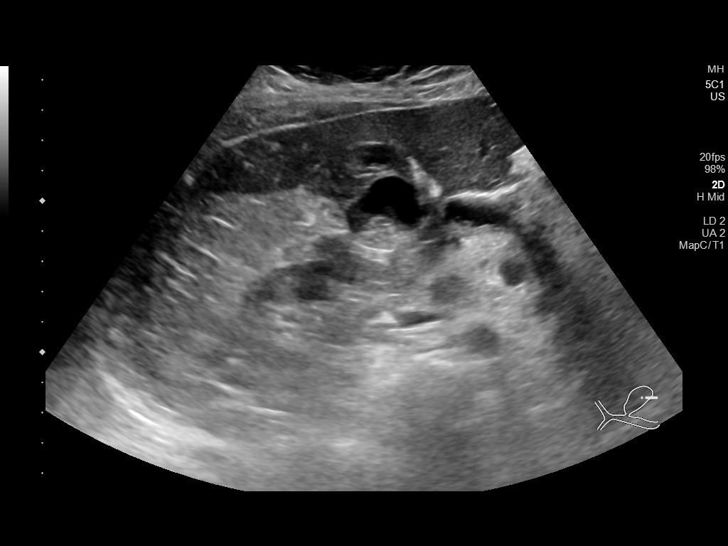
[im 40/87]
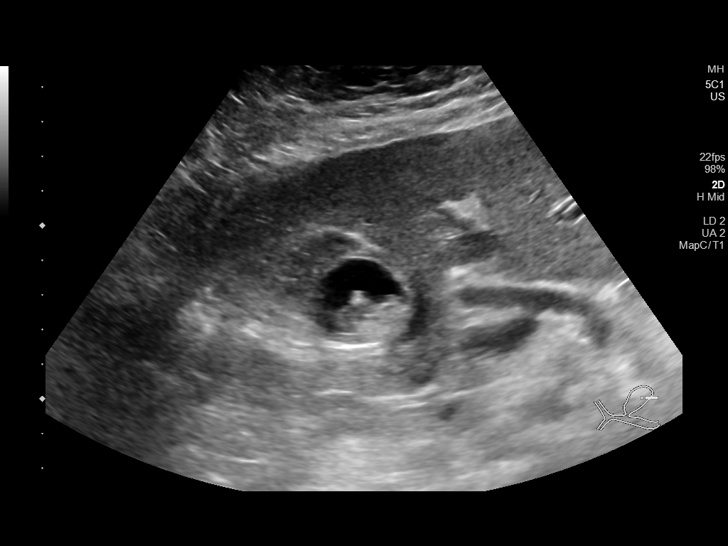
[im 47/87]
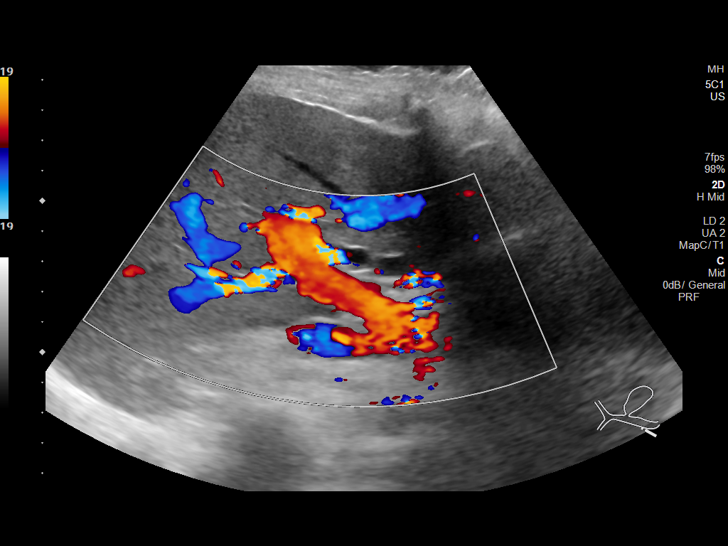
[im 54/87]
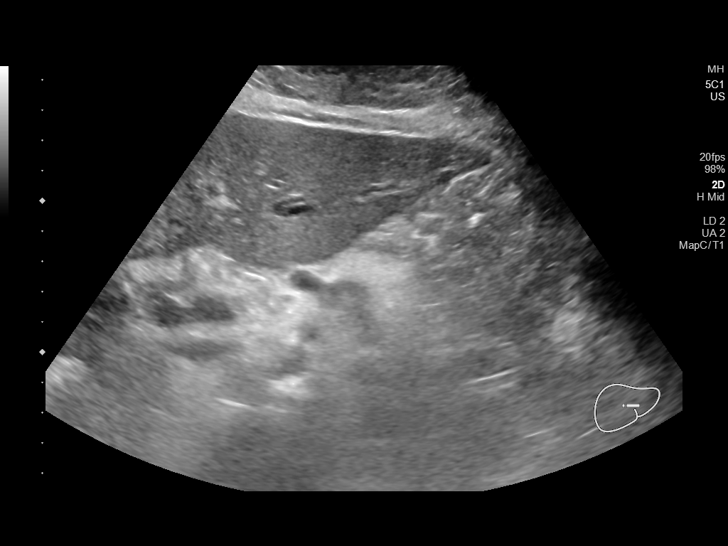
[im 58/87]
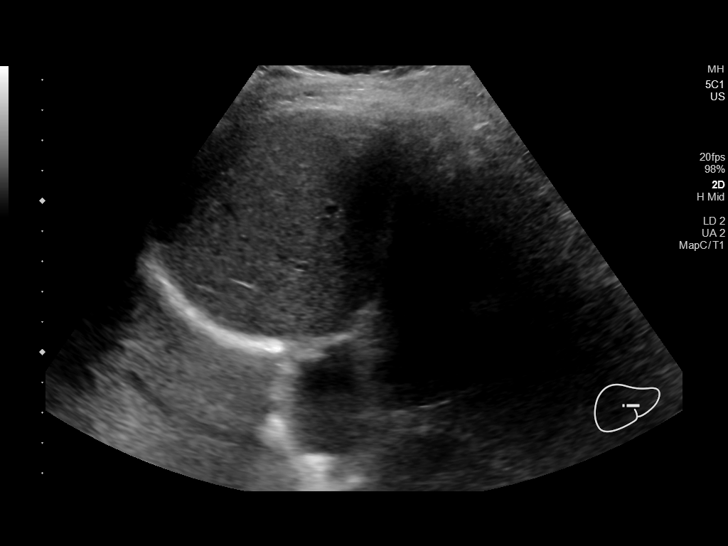
[im 65/87]
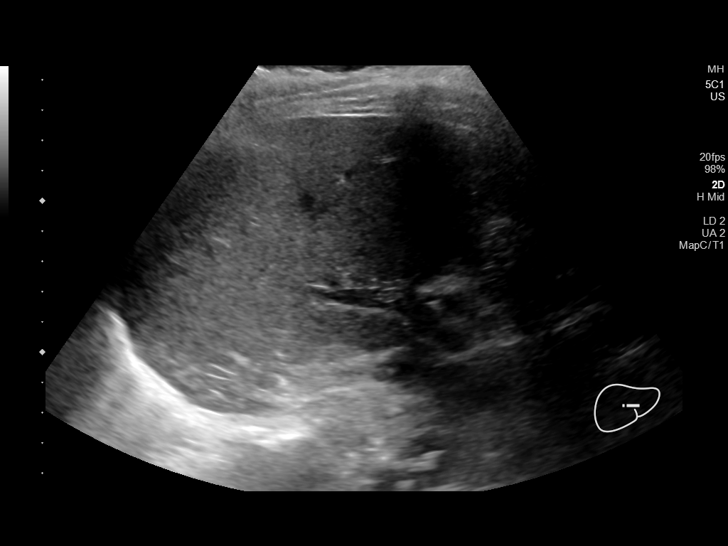
[im 72/87]
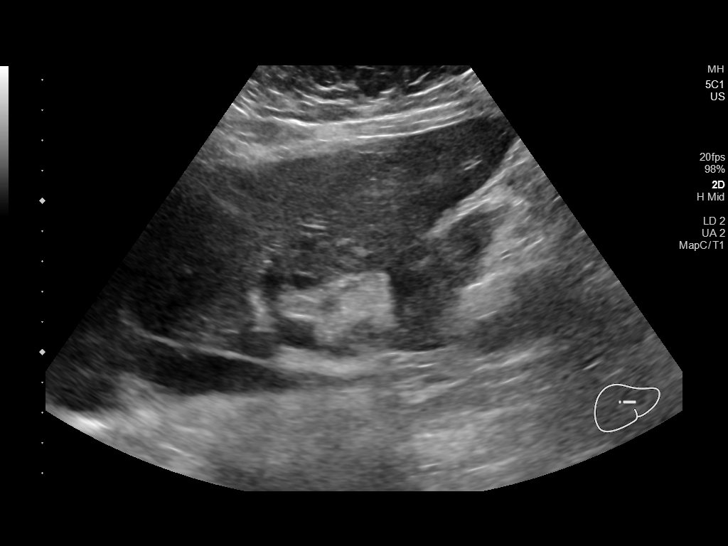
[im 79/87]
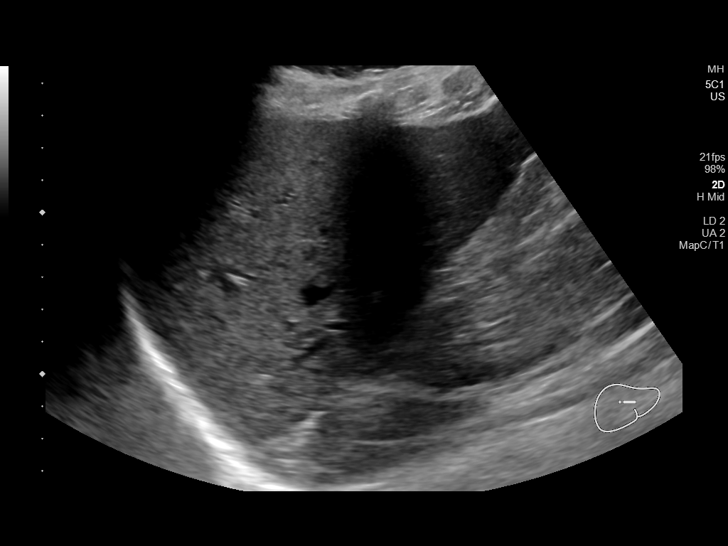
[im 87/87]
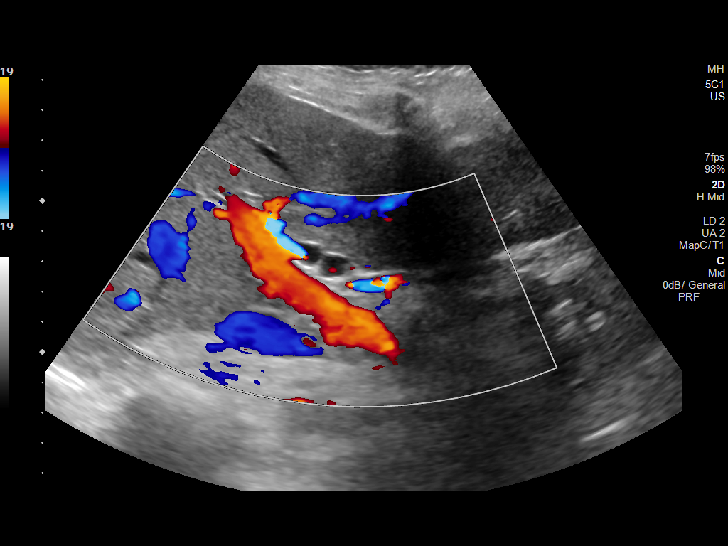

[14 of 25 positions shown; findings below may reference images not displayed]

FINDINGS: Gallbladder:

There is a 1.7 cm calcification within the gallbladder consistent
with a large gallstone. The gallbladder wall remains thickened
edematous with localized fluid which may represent loculated
perforation of the gallbladder. Patient is focally tender over the
gallbladder.

Common bile duct:

Diameter: 7 mm. There is slight intrahepatic biliary duct
dilatation.

Liver:

No focal lesion identified. Within normal limits in parenchymal
echogenicity. Portal vein is patent on color Doppler imaging with
normal direction of blood flow towards the liver.

Other: None.
IMPRESSION: Findings in the gallbladder indicative of acute cholecystitis with
questionable loculated area of perforation.

Common bile duct upper normal in size. Slight degree of intrahepatic
biliary duct dilatation felt to be present.

## 2021-06-12 IMAGING — CT CT ABDOMEN AND PELVIS WITH CONTRAST
2 of 5 series · 16 of 46 positions shown, 18 images · IV contrast (omnipaque)
Comparison: 09/18/2018 CT

CLINICAL DATA: 68-year-old female with acute abdominal and pelvic
pain and nausea.

EXAM:
CT ABDOMEN AND PELVIS WITH CONTRAST
TECHNIQUE: Multidetector CT imaging of the abdomen and pelvis was performed
using the standard protocol following bolus administration of
intravenous contrast.
CONTRAST:  80mL OMNIPAQUE IOHEXOL 300 MG/ML  SOLN

[Series 2: axial st · axial · 0.74mm/px · z∈[-681,-316]mm · 13 of 85 slices shown, 15 images]
[im 6/85  soft-tissue]
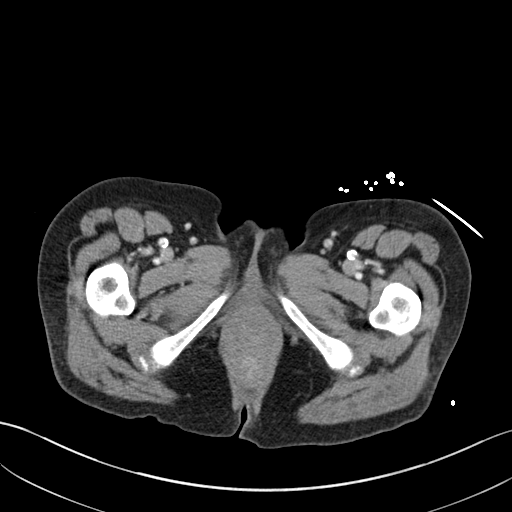
[im 6/85  bone]
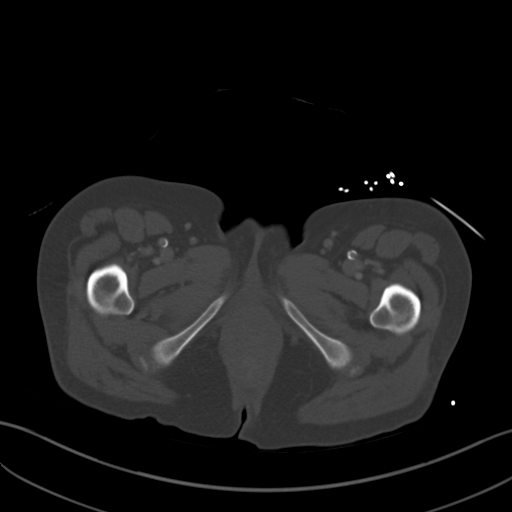
[im 12/85  soft-tissue]
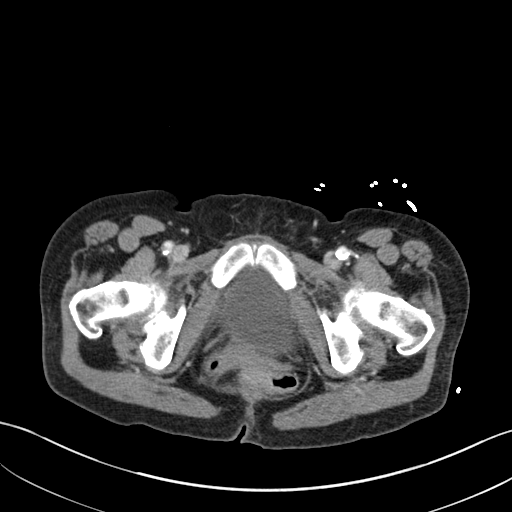
[im 17/85  soft-tissue]
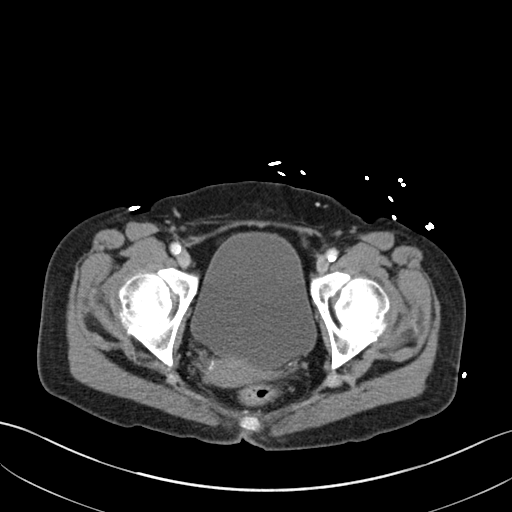
[im 23/85  soft-tissue]
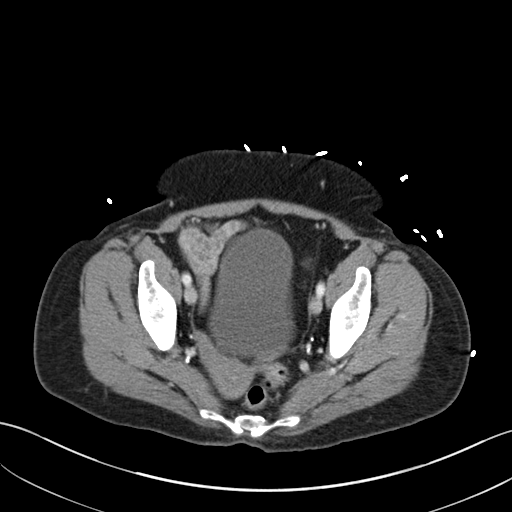
[im 29/85  soft-tissue]
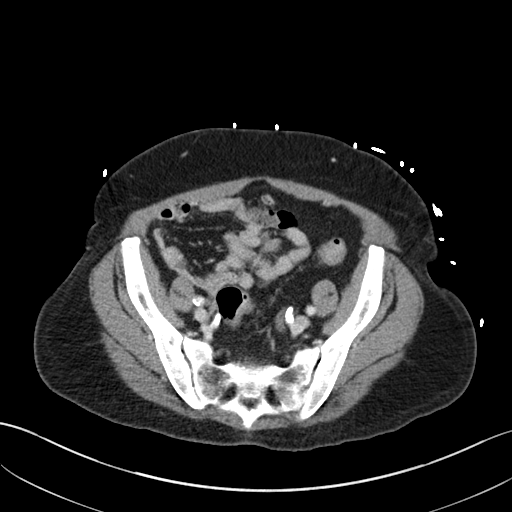
[im 34/85  soft-tissue]
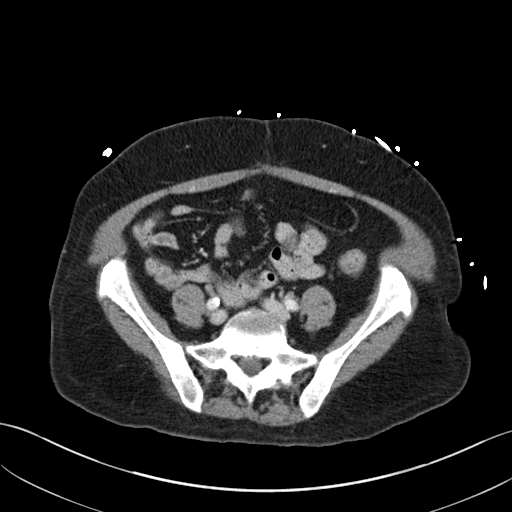
[im 45/85  soft-tissue]
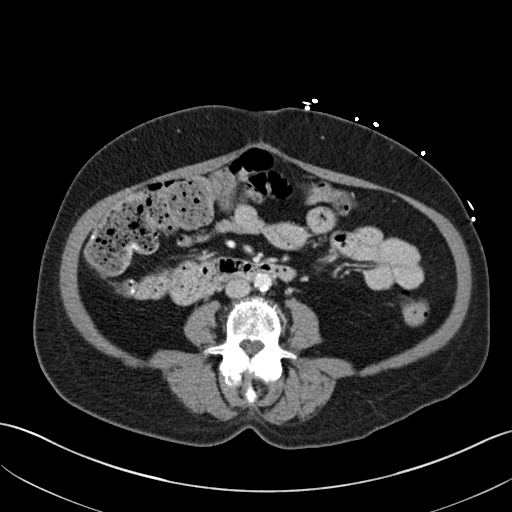
[im 51/85  soft-tissue]
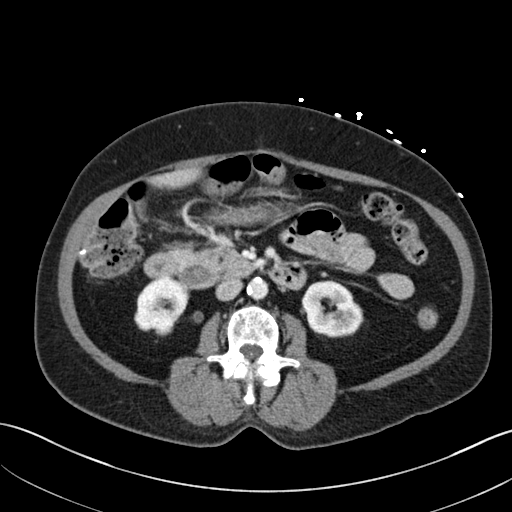
[im 57/85  soft-tissue]
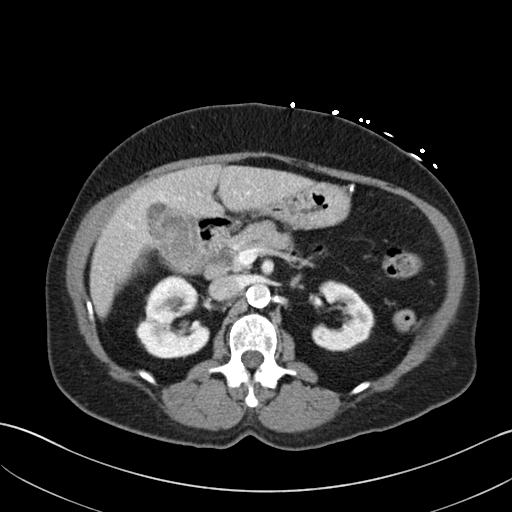
[im 57/85  bone]
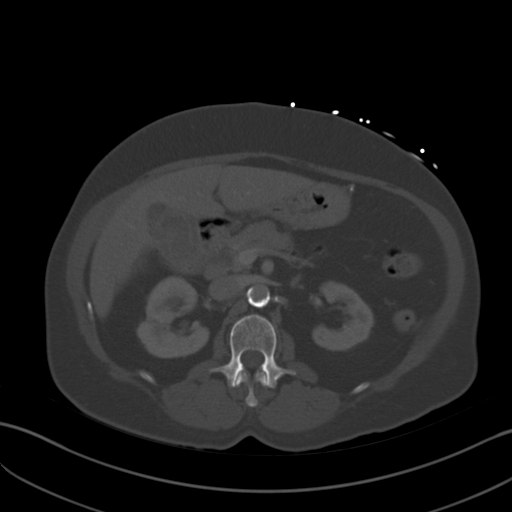
[im 62/85  soft-tissue]
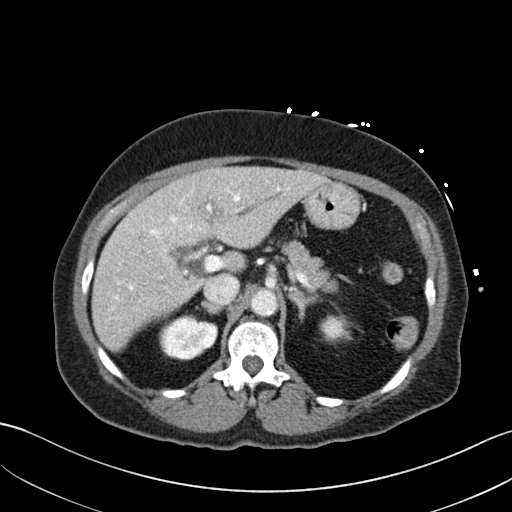
[im 68/85  soft-tissue]
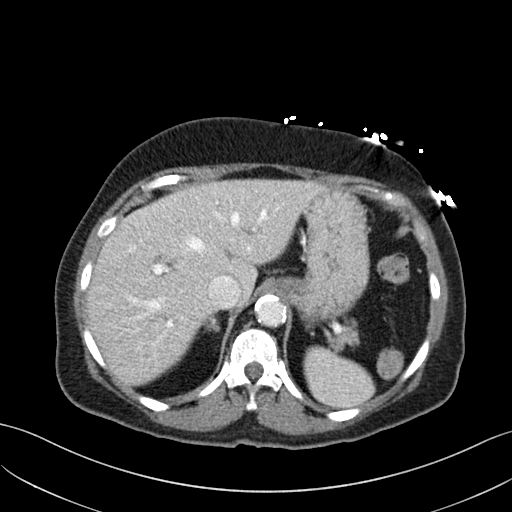
[im 73/85  soft-tissue]
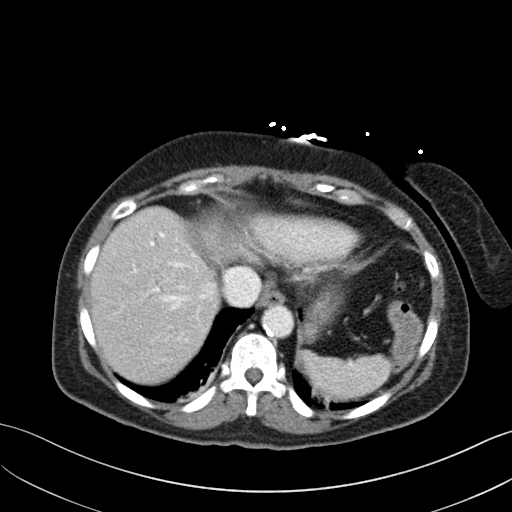
[im 79/85  soft-tissue]
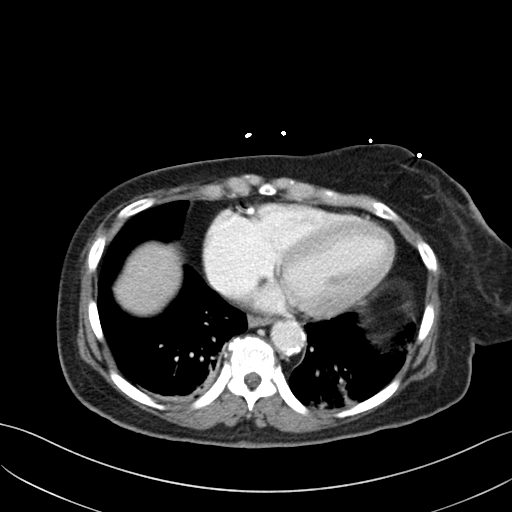

[Series 4: coronal st · coronal · 0.78mm/px · 3 of 112 slices shown]
[im 38/112  soft-tissue]
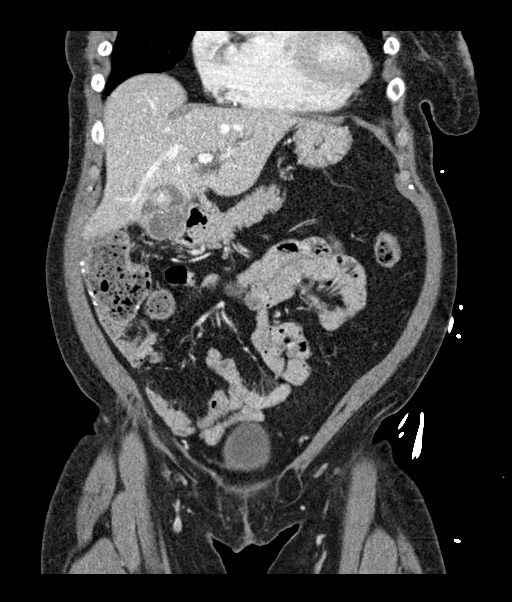
[im 50/112  soft-tissue]
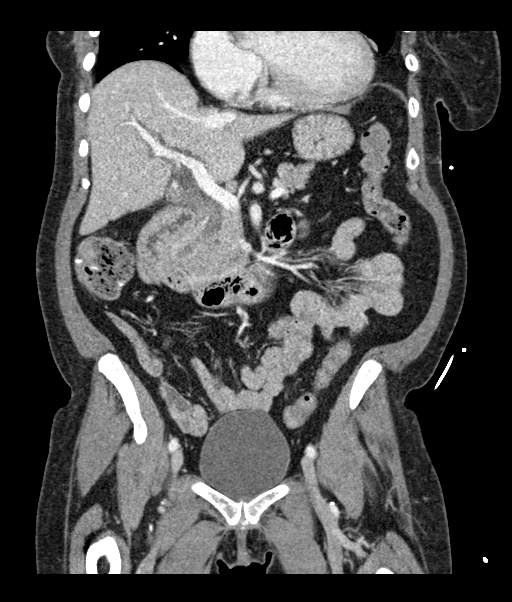
[im 62/112  soft-tissue]
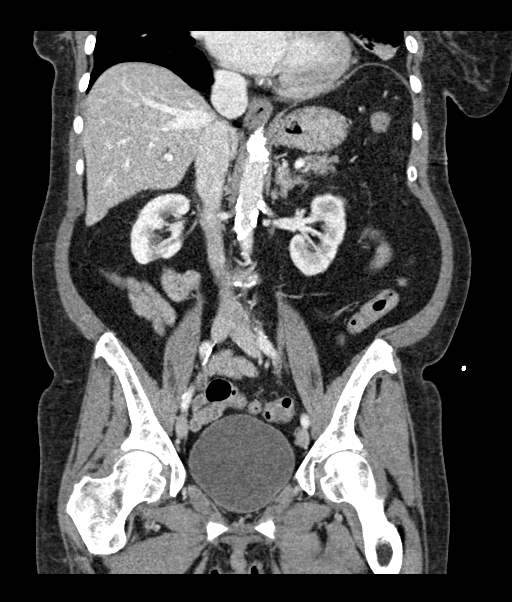

[16 of 46 positions shown; findings below may reference images not displayed]

FINDINGS: Lower chest: Bibasilar atelectasis is identified. Cardiomegaly again
noted.

Hepatobiliary: Cholelithiasis with gallbladder wall thickening again
noted. There is intrahepatic and extrahepatic biliary dilatation to
the ampulla with the CBD measuring up to 14 mm in greatest diameter.
Slight increased density within the visualized CBD is noted and may
represent sludge versus choledocholithiasis.

Pancreas: Unremarkable

Spleen: Unremarkable

Adrenals/Urinary Tract: The kidneys, adrenal glands and bladder are
unremarkable except for unchanged bilateral renal cortical thinning
and probable tiny bilateral renal cysts. There is no evidence of
hydronephrosis or obstructing urinary calculi.

Stomach/Bowel: Stomach is within normal limits. Surgical changes
along the RIGHT colon again noted. No evidence of bowel obstruction.
No evidence of bowel wall thickening, distention, or inflammatory
changes.

Vascular/Lymphatic: Aortic atherosclerosis. No enlarged abdominal or
pelvic lymph nodes.

Reproductive: Unremarkable except for a stable calcified RIGHT
uterine fibroid.

Other: No ascites, focal collection or pneumoperitoneum. Small
bilateral inguinal hernias containing fat are again noted.

Musculoskeletal: No acute or suspicious bony abnormality.
IMPRESSION: 1. Cholelithiasis with gallbladder wall thickening likely
representing acute cholecystitis. Intrahepatic and extrahepatic
biliary dilatation to the ampulla with the CBD now measuring 14 mm.
Increased density within the CBD may represent sludge versus
choledocholithiasis.
2. Cardiomegaly and bibasilar atelectasis.
3. Unchanged small bilateral inguinal hernias containing fat.
4.  Aortic Atherosclerosis ([id number].J).

## 2021-06-13 IMAGING — RF ERCP
1 series · 15 of 24 positions shown · non-contrast
Comparison: 10/02/2018 CT

CLINICAL DATA: Choledocholithiasis, biliary dilatation

EXAM:
ERCP with sphincterotomy and balloon extraction
TECHNIQUE: Multiple spot images obtained with the fluoroscopic device and
submitted for interpretation post-procedure.
FLUOROSCOPY TIME:  Fluoroscopy Time:  7 minutes 5 seconds
Radiation Exposure Index (if provided by the fluoroscopic device):
127.74

[Series 1: run · 16 acquisitions, 15 frames shown]
[im 1/16]
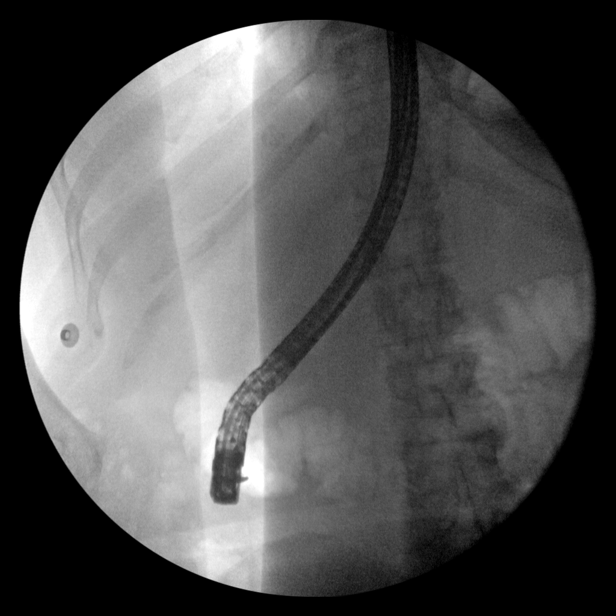
[im 3/16]
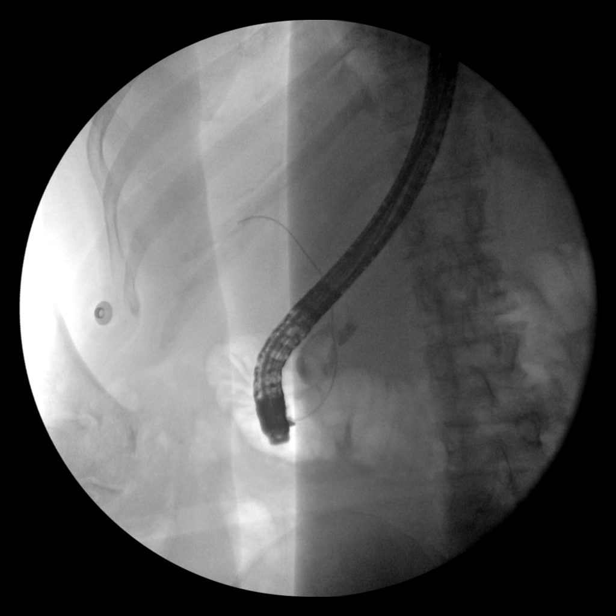
[im 3/16]
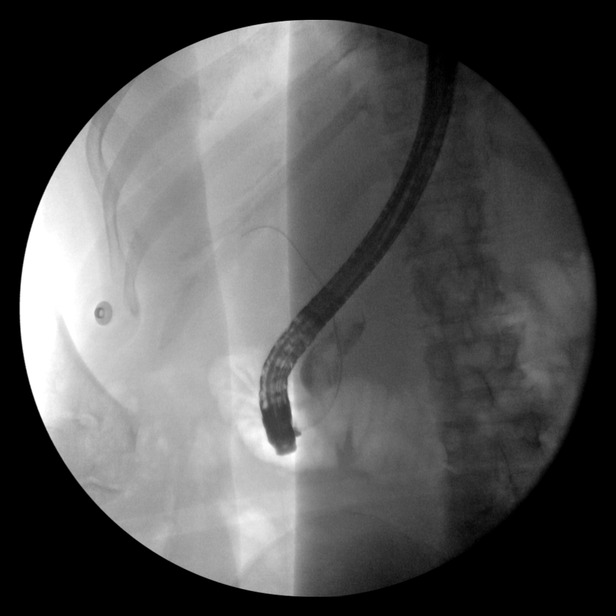
[im 4/16]
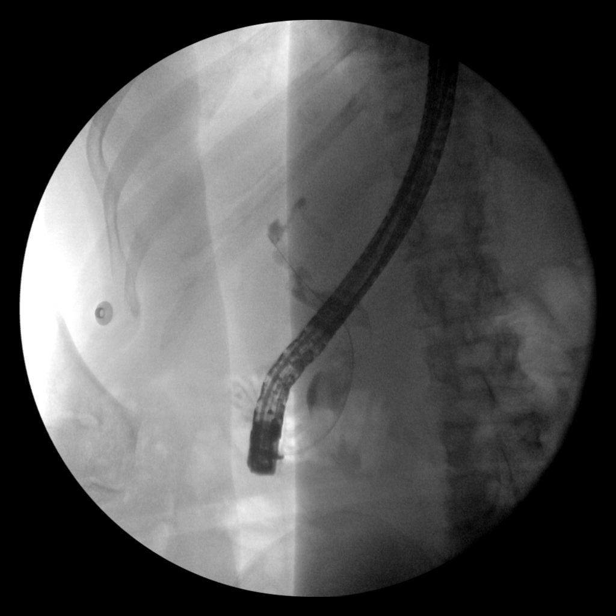
[im 4/16]
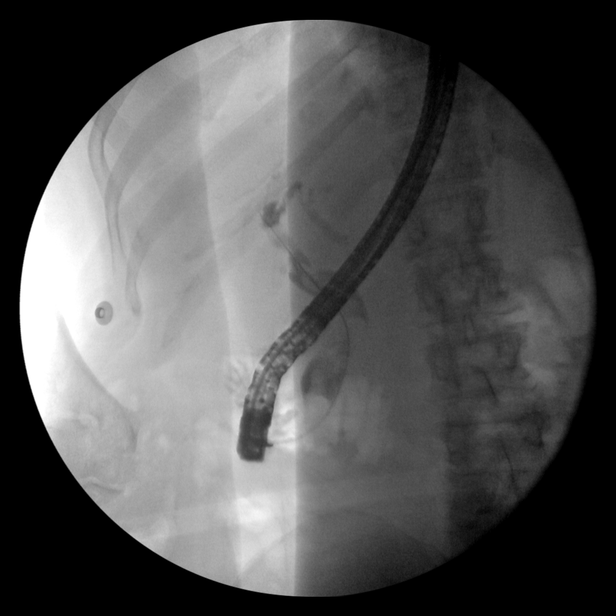
[im 5/16]
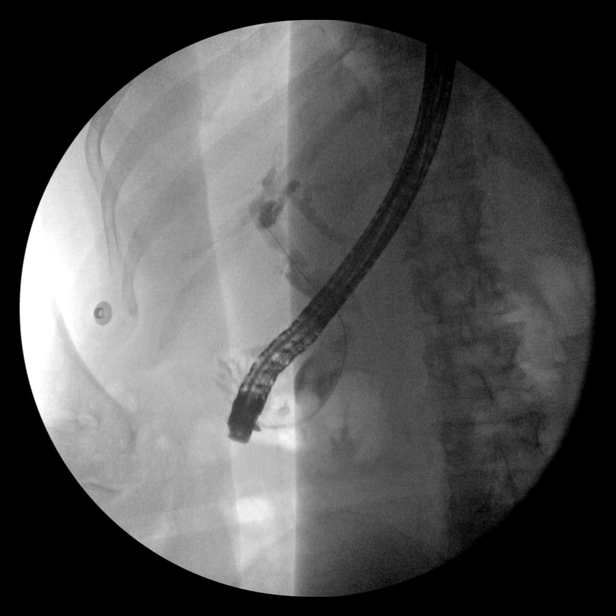
[im 7/16]
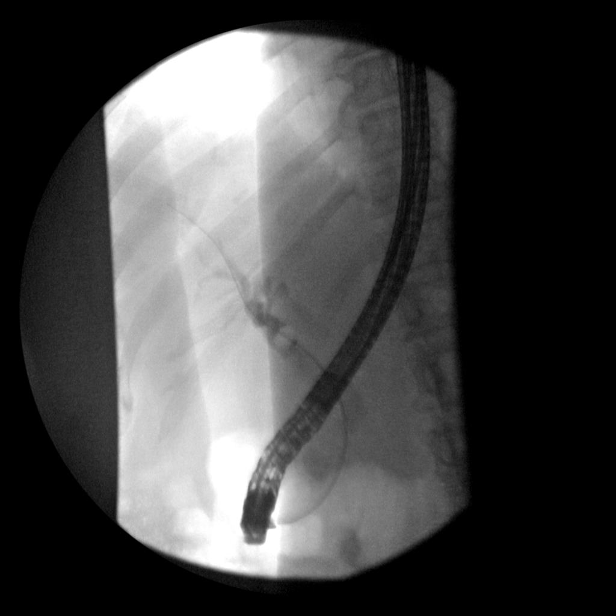
[im 8/16]
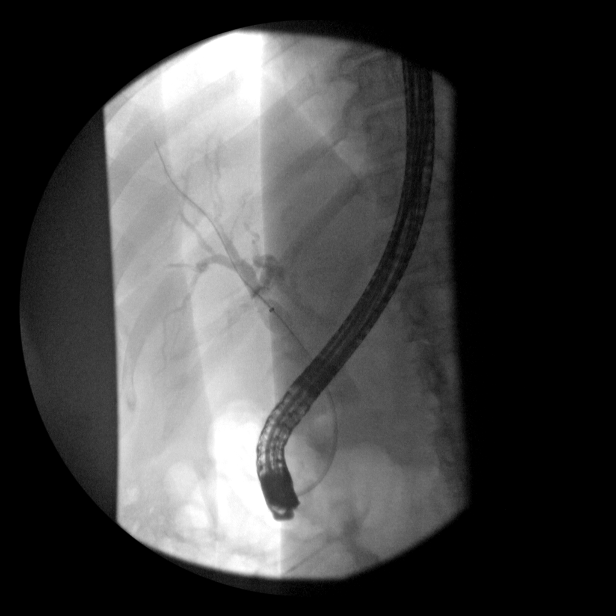
[im 8/16]
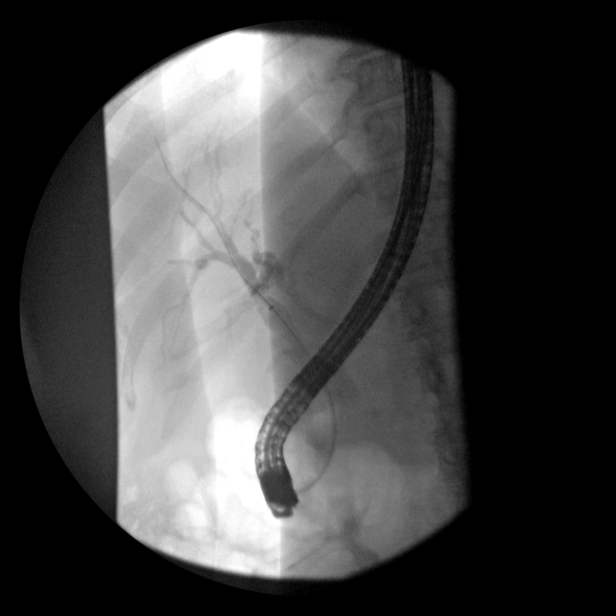
[im 9/16]
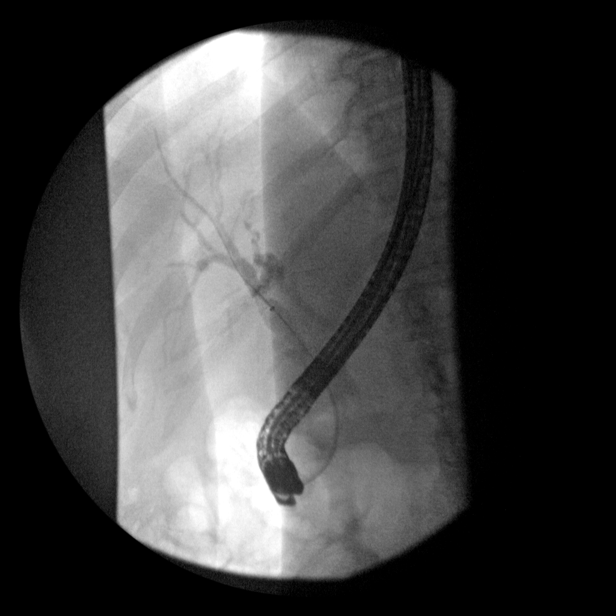
[im 10/16]
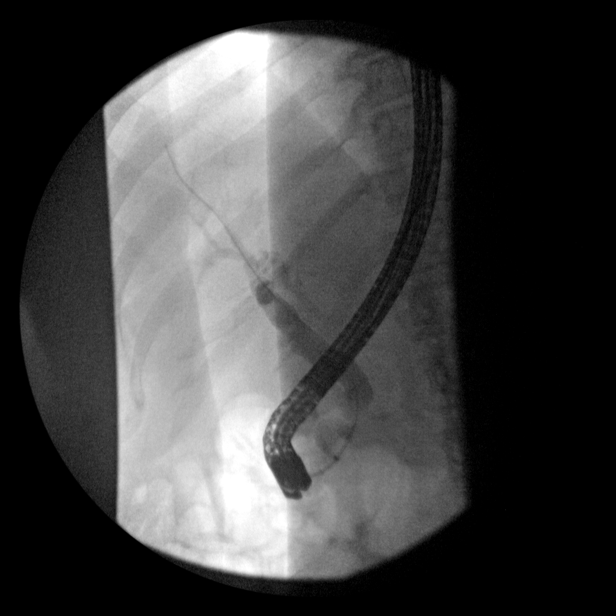
[im 12/16]
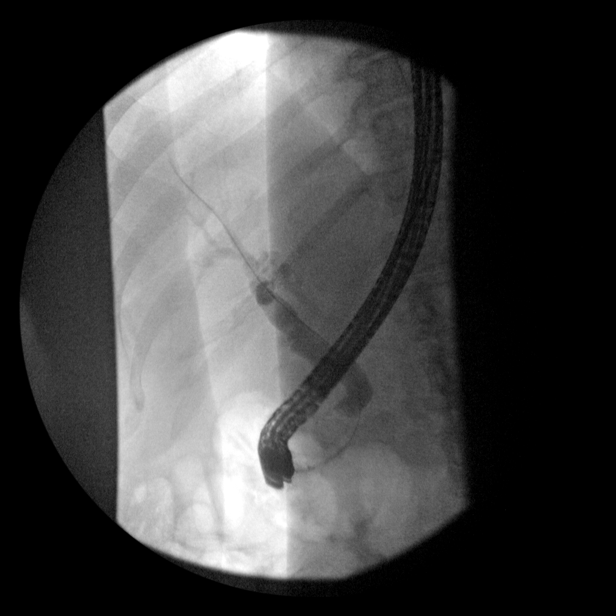
[im 15/16]
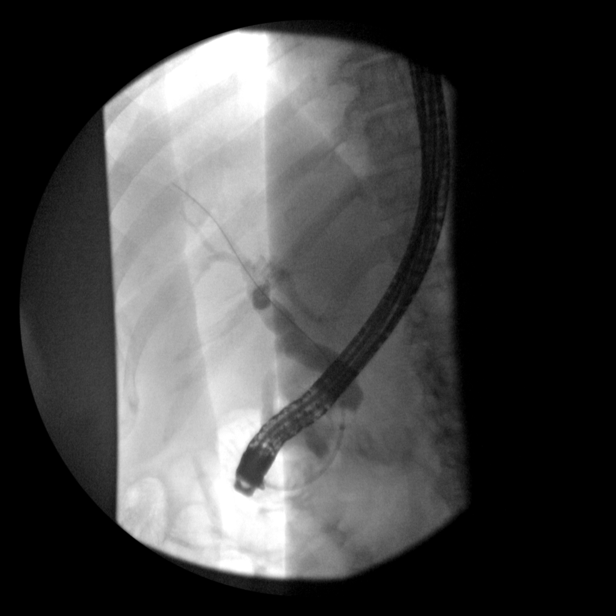
[im 15/16]
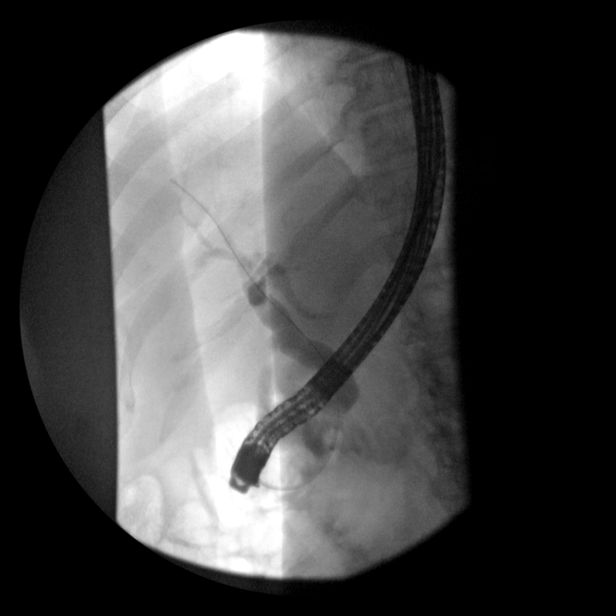
[im 16/16]
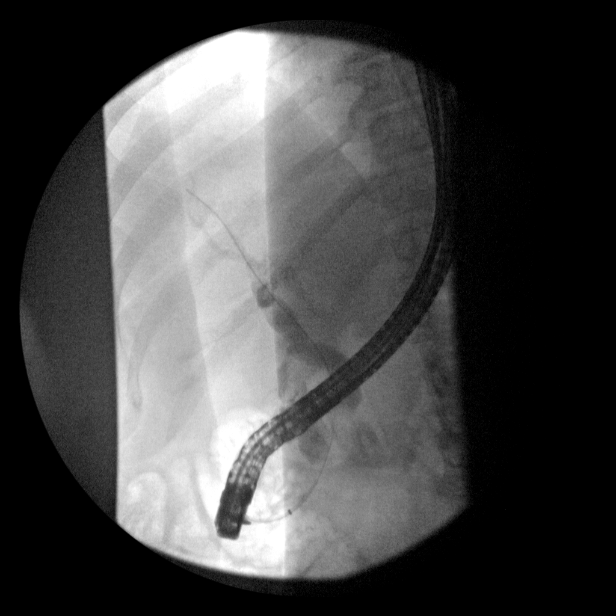

[15 of 24 positions shown; findings below may reference images not displayed]

FINDINGS: Intraoperative cholangiogram during the ERCP demonstrate biliary
dilatation and filling defects throughout the common bile duct
without obstruction. Guidewire access obtained. Balloon extraction
performed for removal of the duct stones, debris and/or sludge.
Please refer to the ERCP for details of the procedure.
IMPRESSION: Limited imaging during ERCP with sphincterotomy and balloon
extraction to remove sludge and clots from the common bile duct as
described in the ERCP report.

These images were submitted for radiologic interpretation only.
Please see the procedural report for the amount of contrast and the
fluoroscopy time utilized.

## 2021-07-20 ENCOUNTER — Encounter: Payer: Self-pay | Admitting: Family Medicine

## 2021-07-20 DIAGNOSIS — H43823 Vitreomacular adhesion, bilateral: Secondary | ICD-10-CM | POA: Diagnosis not present

## 2021-07-20 DIAGNOSIS — H10413 Chronic giant papillary conjunctivitis, bilateral: Secondary | ICD-10-CM | POA: Diagnosis not present

## 2021-07-20 DIAGNOSIS — Z794 Long term (current) use of insulin: Secondary | ICD-10-CM | POA: Diagnosis not present

## 2021-07-20 DIAGNOSIS — E119 Type 2 diabetes mellitus without complications: Secondary | ICD-10-CM | POA: Diagnosis not present

## 2021-07-20 DIAGNOSIS — H04123 Dry eye syndrome of bilateral lacrimal glands: Secondary | ICD-10-CM | POA: Diagnosis not present

## 2021-07-20 DIAGNOSIS — Z961 Presence of intraocular lens: Secondary | ICD-10-CM | POA: Diagnosis not present

## 2021-07-20 LAB — HM DIABETES EYE EXAM

## 2021-07-22 IMAGING — RF DG CHOLANGIOGRAM OPERATIVE
1 series · 6 of 6 positions shown · non-contrast
Comparison: October 03, 2018

CLINICAL DATA: Intraoperative cholangiogram

EXAM:
INTRAOPERATIVE CHOLANGIOGRAM
TECHNIQUE: Cholangiographic images from the C-arm fluoroscopic device were
submitted for interpretation post-operatively. Please see the
procedural report for the amount of contrast and the fluoroscopy
time utilized.

[Series 1: run · 3 acquisitions, 6 frames shown]
[im 1/3]
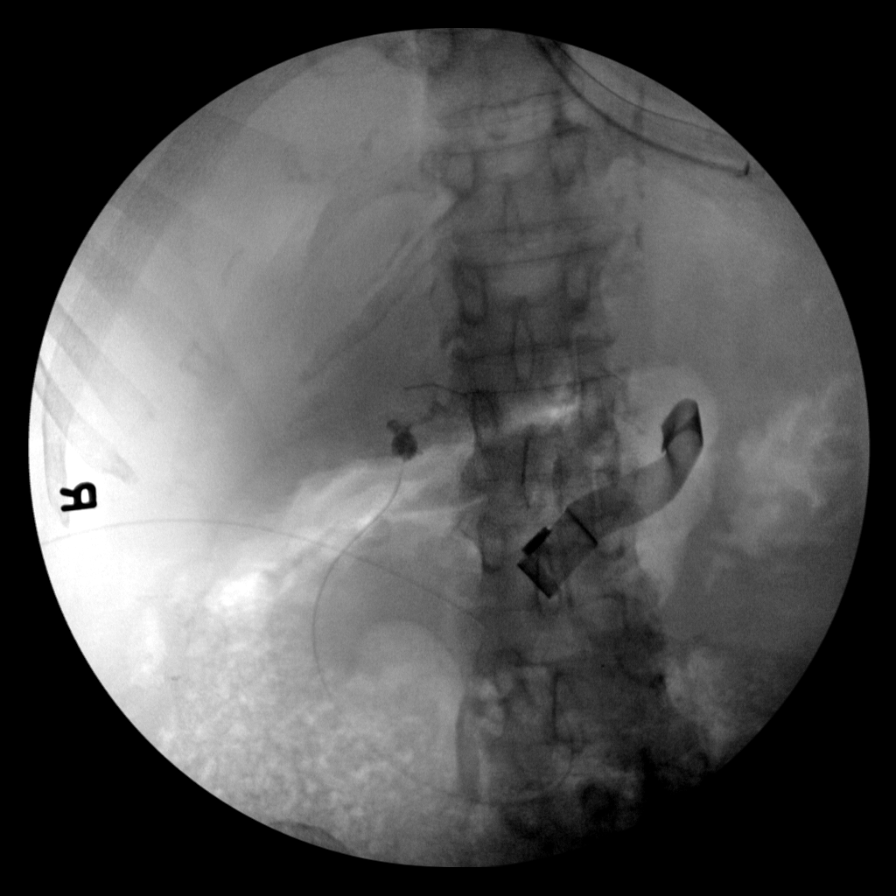
[im 1/3]
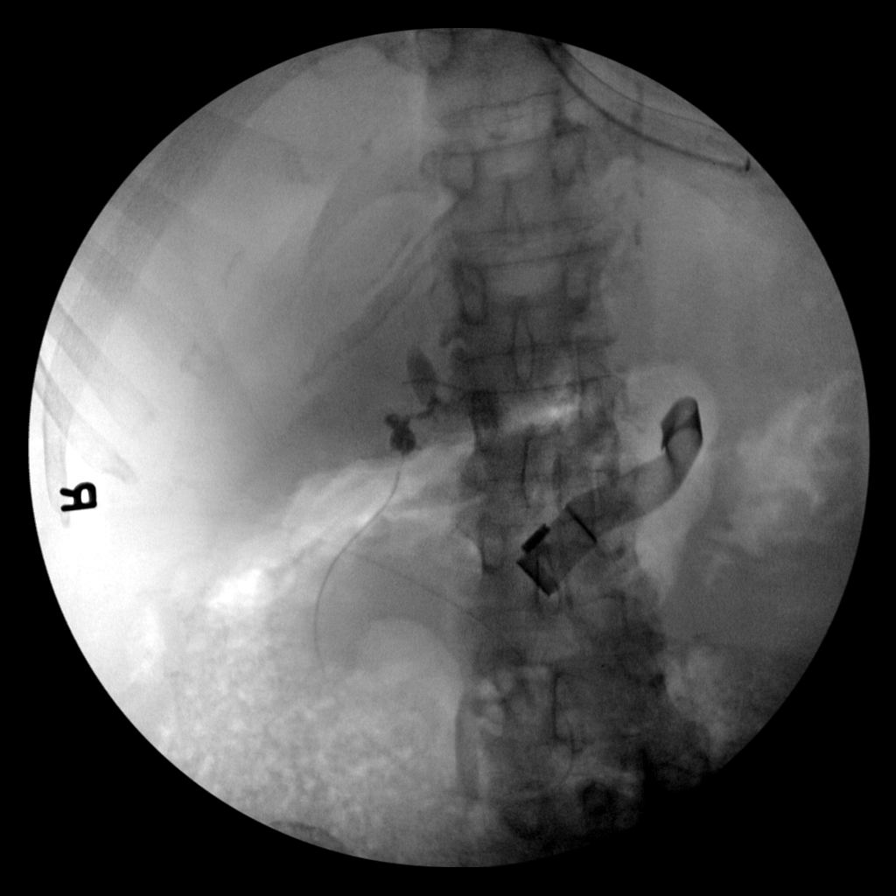
[im 1/3]
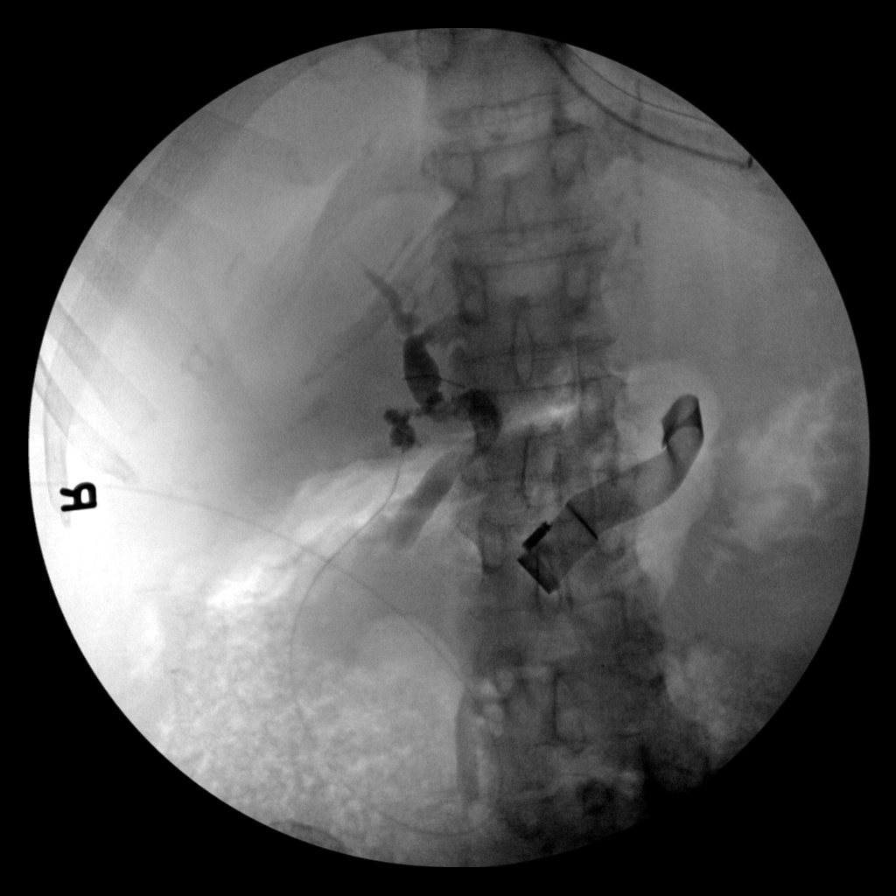
[im 1/3]
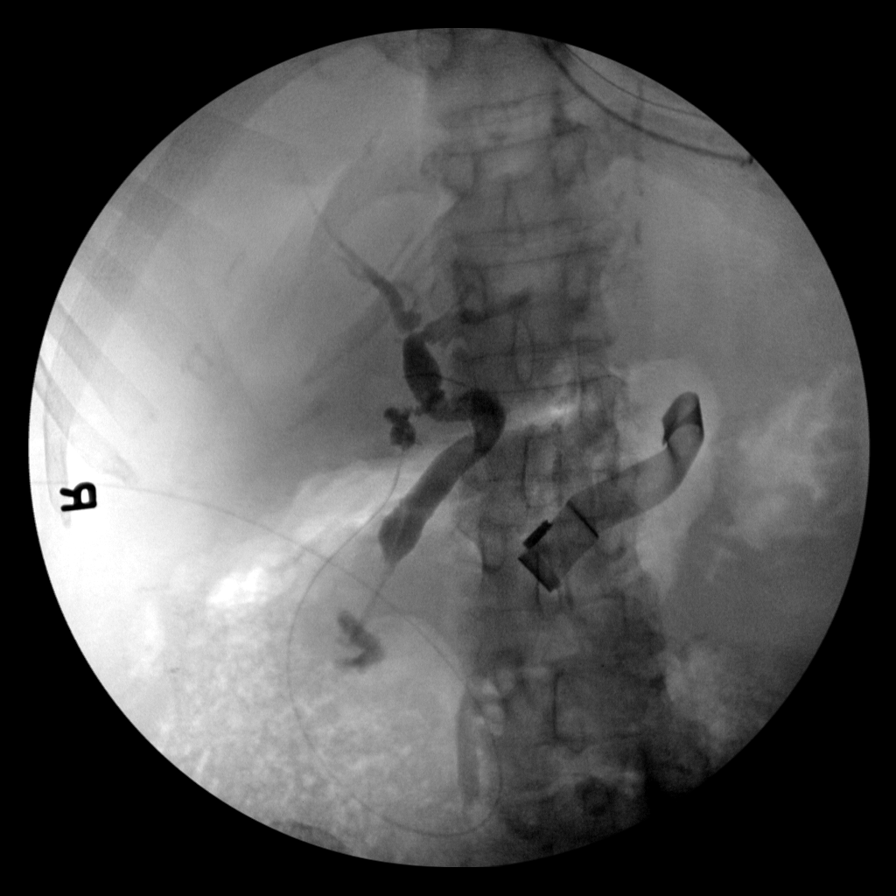
[im 2/3]
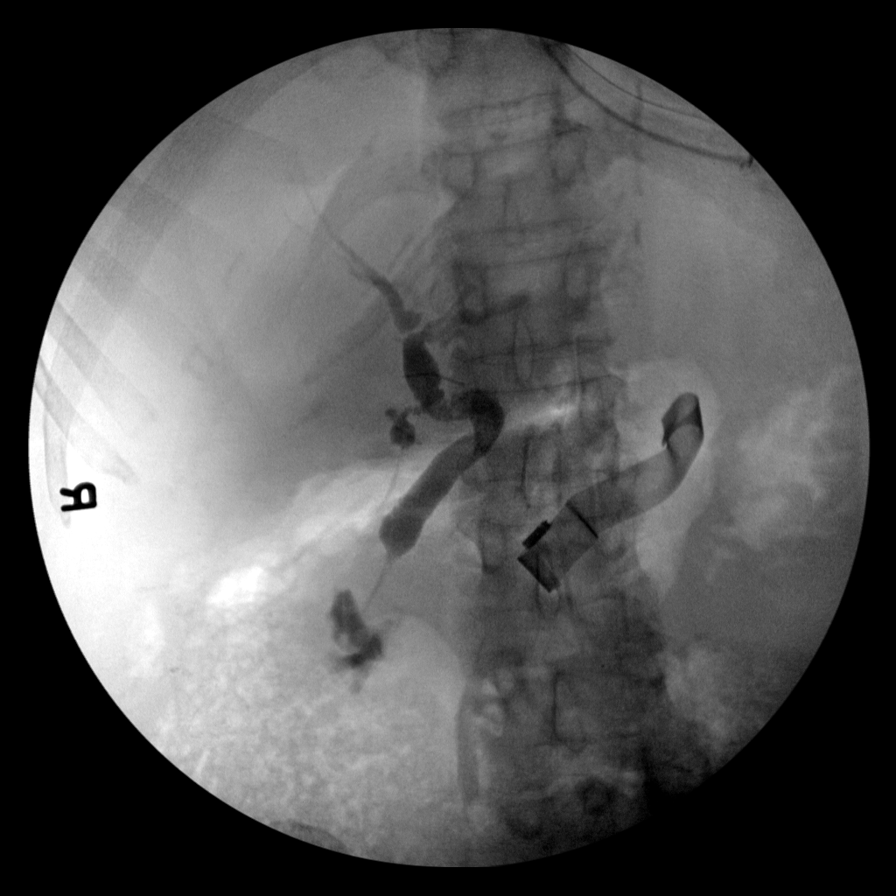
[im 3/3]
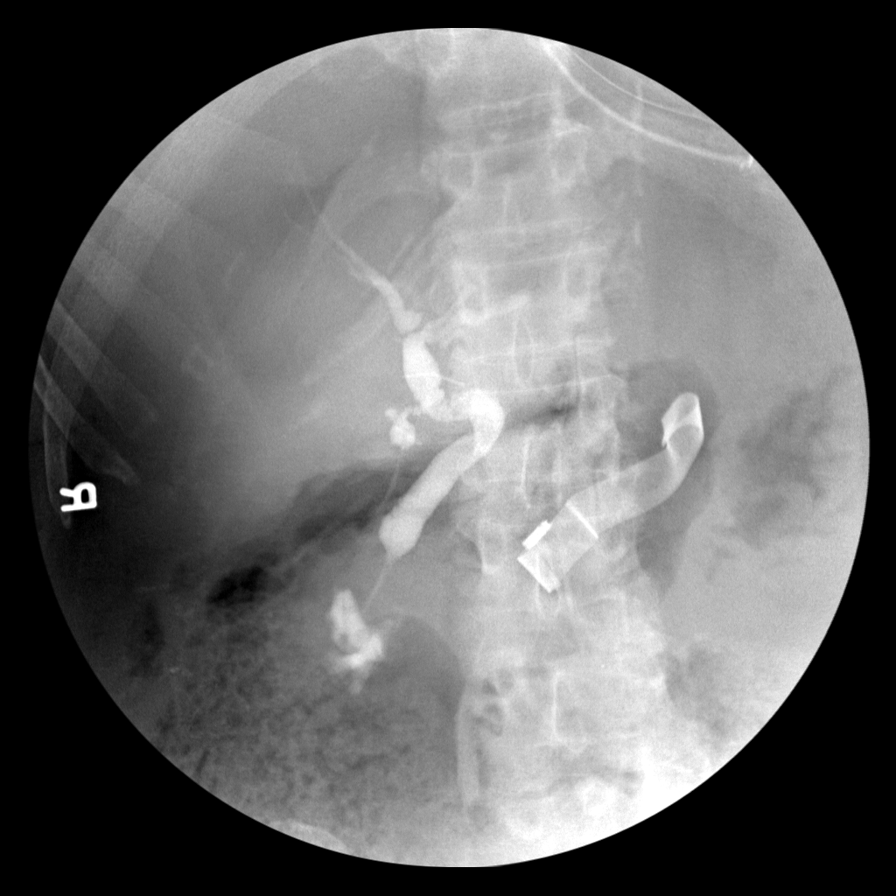

[6 of 6 positions shown; findings below may reference images not displayed]

FINDINGS: There is cannulation of the cystic duct. The cystic duct appears
patent. There may be small filling defects in the proximal portion
of the common bile duct just distal to the distal most aspect of the
patent duct. There is a long segment narrowing of the distal common
bile duct of unknown clinical significance. There is a questionable
intrahepatic area of stenosis involving the bile duct draining
hepatic segment 4 and potentially 8. This is not well evaluated
secondary to a lack of additional views.
IMPRESSION: Status post intraoperative cholangiogram with findings concerning
for choledocholithiasis as detailed above. There is a narrowing of
the distal common bile duct of unknown clinical significance.

## 2021-07-28 IMAGING — DX DG CHEST 1V PORT
1 series · 1 of 1 positions shown · non-contrast
Comparison: 07/12/2018

CLINICAL DATA: NEW ONSET STERNAL PAIN RADIATING INTO BACK HX
MI/STENTS 0482, CAD, COPD, RT MASTECTOMY CHRONIC CHOLECYSTITIS
,former smoker

EXAM:
PORTABLE CHEST 1 VIEW

[chest ap]
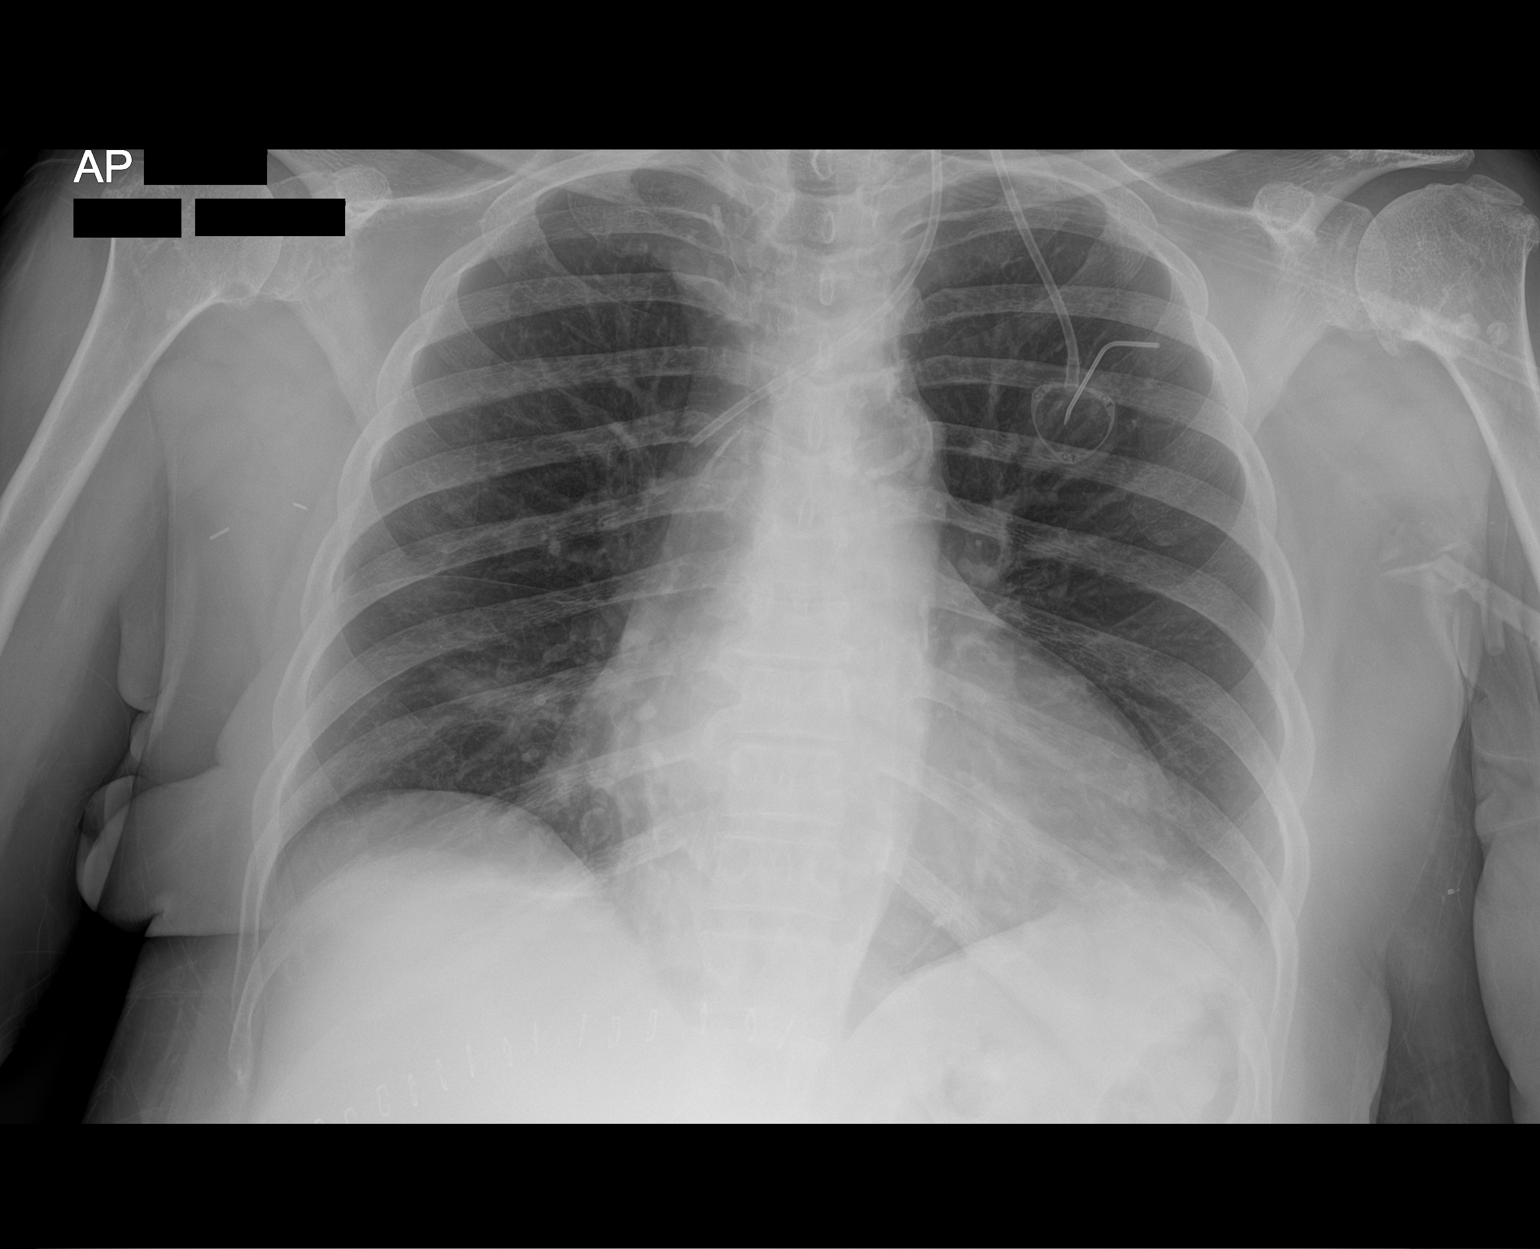

[1 of 1 positions shown; findings below may reference images not displayed]

FINDINGS: Power port with tip in the mid SVC. Normal cardiac silhouette. No
effusion, infiltrate, or pneumothorax. No acute osseous abnormality.
IMPRESSION: No acute cardiopulmonary process.

## 2021-07-31 ENCOUNTER — Ambulatory Visit: Payer: Self-pay | Admitting: *Deleted

## 2021-08-01 DIAGNOSIS — H10413 Chronic giant papillary conjunctivitis, bilateral: Secondary | ICD-10-CM | POA: Diagnosis not present

## 2021-08-01 DIAGNOSIS — H04123 Dry eye syndrome of bilateral lacrimal glands: Secondary | ICD-10-CM | POA: Diagnosis not present

## 2021-08-02 ENCOUNTER — Other Ambulatory Visit: Payer: Self-pay | Admitting: Nurse Practitioner

## 2021-08-02 ENCOUNTER — Ambulatory Visit: Payer: Self-pay | Admitting: *Deleted

## 2021-08-02 ENCOUNTER — Telehealth: Payer: Medicare Other | Admitting: Physician Assistant

## 2021-08-02 DIAGNOSIS — J449 Chronic obstructive pulmonary disease, unspecified: Secondary | ICD-10-CM

## 2021-08-02 DIAGNOSIS — J441 Chronic obstructive pulmonary disease with (acute) exacerbation: Secondary | ICD-10-CM | POA: Diagnosis not present

## 2021-08-02 DIAGNOSIS — I10 Essential (primary) hypertension: Secondary | ICD-10-CM

## 2021-08-02 MED ORDER — DOXYCYCLINE HYCLATE 100 MG PO TABS: 100.0000 mg | ORAL_TABLET | Freq: Two times a day (BID) | ORAL | 0 refills | Status: DC

## 2021-08-02 MED ORDER — PREDNISONE 20 MG PO TABS: 40.0000 mg | ORAL_TABLET | Freq: Every day | ORAL | 0 refills | Status: DC

## 2021-08-02 MED ORDER — ALBUTEROL SULFATE (2.5 MG/3ML) 0.083% IN NEBU: 2.5000 mg | INHALATION_SOLUTION | Freq: Four times a day (QID) | RESPIRATORY_TRACT | 3 refills | Status: AC | PRN

## 2021-08-02 MED ORDER — BENZONATATE 100 MG PO CAPS: 100.0000 mg | ORAL_CAPSULE | Freq: Three times a day (TID) | ORAL | 0 refills | Status: DC | PRN

## 2021-08-02 NOTE — Telephone Encounter (Signed)
Noted patient has virtual appointment today.

## 2021-08-02 NOTE — Telephone Encounter (Signed)
Requested Prescriptions  Pending Prescriptions Disp Refills  . amLODipine (NORVASC) 10 MG tablet [Pharmacy Med Name: amlodipine 10 mg tablet] 90 tablet 1    Sig: TAKE ONE TABLET BY MOUTH EVERY DAY     Cardiovascular: Calcium Channel Blockers 2 Passed - 08/02/2021  8:01 AM      Passed - Last BP in normal range    BP Readings from Last 1 Encounters:  05/17/21 128/87         Passed - Last Heart Rate in normal range    Pulse Readings from Last 1 Encounters:  05/17/21 88         Passed - Valid encounter within last 6 months    Recent Outpatient Visits          2 months ago Type 2 diabetes mellitus with complication, without long-term current use of insulin ([place])   [place], Vernia Buff, NP   3 months ago Essential hypertension   [place], Jarome [place]atin, RPH-CPP   4 months ago Essential hypertension   [place], Vernia Buff, NP   7 months ago Essential hypertension   [place], NP   7 months ago Essential hypertension   [place], Vernia Buff, NP

## 2021-08-02 NOTE — Progress Notes (Signed)
Virtual Visit Consent   [name], you are scheduled for a virtual visit with a [facility] provider today. Just as with appointments in the office, your consent must be obtained to participate. Your consent will be active for this visit and any virtual visit you may have with one of our providers in the next 365 days. If you have a MyChart account, a copy of this consent can be sent to you electronically.  As this is a virtual visit, video technology does not allow for your provider to perform a traditional examination. This may limit your provider's ability to fully assess your condition. If your provider identifies any concerns that need to be evaluated in person or the need to arrange testing (such as labs, EKG, etc.), we will make arrangements to do so. Although advances in technology are sophisticated, we cannot ensure that it will always work on either your end or our end. If the connection with a video visit is poor, the visit may have to be switched to a telephone visit. With either a video or telephone visit, we are not always able to ensure that we have a secure connection.  By engaging in this virtual visit, you consent to the provision of healthcare and authorize for your insurance to be billed (if applicable) for the services provided during this visit. Depending on your insurance coverage, you may receive a charge related to this service.  I need to obtain your verbal consent now. Are you willing to proceed with your visit today? [name] has provided verbal consent on 08/02/2021 for a virtual visit (video or telephone). Mar Daring, PA-C  Date: 08/02/2021 11:23 AM  Virtual Visit via Video Note   I, Mar Daring, connected with  [name]  ([id number], 05-Apr-1950) on 08/02/21 at 11:00 AM EDT by a video-enabled telemedicine application and verified that I am speaking with the correct person using two identifiers.  Location: Patient: Virtual Visit Location  Patient: Home Provider: Virtual Visit Location Provider: Home Office   I discussed the limitations of evaluation and management by telemedicine and the availability of in person appointments. The patient expressed understanding and agreed to proceed.    Interactive audio and video communications were attempted, although failed due to patient's inability to connect to video. Continued visit with audio only interaction with patient agreement.   History of Present Illness: [name] is a 71 y.o. who identifies as a female who was assigned female at birth, and is being seen today for cough.  HPI: Cough This is a recurrent problem. The current episode started 1 to 4 weeks ago (couple weeks ago). The problem has been gradually worsening. The problem occurs every few minutes. The cough is Productive of sputum and productive of purulent sputum. Associated symptoms include chest pain (from cough), ear congestion (both), a fever (temperature one time), myalgias, nasal congestion, postnasal drip, rhinorrhea, a sore throat (from coughing), shortness of breath and wheezing. Pertinent negatives include no chills, ear pain or headaches. The symptoms are aggravated by lying down. Risk factors for lung disease include smoking/tobacco exposure. She has tried a beta-agonist inhaler and ipratropium inhaler for the symptoms. The treatment provided no relief. Her past medical history is significant for bronchitis and COPD.      Problems:  Patient Active Problem List   Diagnosis Date Noted   S/P reverse total shoulder arthroplasty, 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   MGUS (monoclonal gammopathy of unknown significance) 11/29/2020   ESRD (end stage  renal disease) ([place]) 03/15/2020   Insulin dependent type 2 diabetes mellitus ([place]) 12/09/2018   COPD (chronic obstructive pulmonary disease) ([place]) 12/09/2018   Chronic cholecystitis 11/11/2018   Hemobilia    Dilated bile duct    Jaundice    Elevated LFTs    Choledocholithiasis  10/02/2018   CKD (chronic kidney disease), stage III    Acute cholecystitis    Elevated liver enzymes 09/18/2018   Calculus of gallbladder and bile duct with acute cholecystitis, with obstruction    Melena    Unstable angina ([place]) 07/12/2018   Port-A-Cath in place 02/27/2018   Cancer of overlapping sites of right breast ([place]) 11/27/2017   Malignant neoplasm of upper-outer quadrant of right breast in female, estrogen receptor positive ([place]) 10/03/2017   Carpal tunnel syndrome, bilateral 11/26/2016   Ulnar neuropathy at elbow, 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   Abdominal pain, epigastric    Acute on chronic renal failure (HCC) 02/19/2015   Diarrhea    Nausea with vomiting    Hyperglycemia 04/01/2014   Hyperosmolar non-ketotic state in patient with type 2 diabetes mellitus ([place]) 04/01/2014   Nausea vomiting and diarrhea 04/01/2014   CAD (coronary artery disease) 04/01/2014   Dehydration    Diabetic ketoacidosis without coma associated with diabetes mellitus due to underlying condition ([place])    High anion gap metabolic acidosis    Cocaine abuse ([place])    Toxic encephalopathy-Unlikely metabolic,?psychogenic 08/02/2012   Bereavement 02/02/2012   Depression 02/02/2012   Iron deficiency anemia, unspecified 11/30/2011   Nonspecific abnormal finding in stool contents 11/30/2011   Benign neoplasm of colon 11/30/2011   Cough 05/14/2011   Chest pain 03/19/2011   Macrocytic anemia 03/19/2011   Type 2 diabetes mellitus with complication, without long-term current use of insulin ([place]) 03/19/2011   DOE (dyspnea on exertion) 03/19/2011   Chest pain at rest 02/18/2011    Class: Acute   Cholelithiasis 11/01/2010   DM 04/08/2009   HYPERLIPIDEMIA 04/08/2009   HTN (hypertension) 04/08/2009   MYOCARDIAL INFARCTION 04/08/2009   Coronary atherosclerosis 04/08/2009   GERD 04/08/2009   FATTY LIVER DISEASE 04/08/2009   COLONIC POLYPS, HX OF 04/08/2009    Allergies: No Known Allergies Medications:  Current  Outpatient Medications:    benzonatate (TESSALON) 100 MG capsule, Take 1 capsule (100 mg total) by mouth 3 (three) times daily as needed., Disp: 30 capsule, Rfl: 0   doxycycline (VIBRA-TABS) 100 MG tablet, Take 1 tablet (100 mg total) by mouth 2 (two) times daily., Disp: 20 tablet, Rfl: 0   predniSONE (DELTASONE) 20 MG tablet, Take 2 tablets (40 mg total) by mouth daily with breakfast., Disp: 14 tablet, Rfl: 0   albuterol (PROVENTIL) (2.5 MG/3ML) 0.083% nebulizer solution, Take 3 mLs (2.5 mg total) by nebulization every 6 (six) hours as needed for wheezing or shortness of breath., Disp: 75 mL, Rfl: 3   amLODipine (NORVASC) 10 MG tablet, TAKE ONE TABLET BY MOUTH EVERY DAY, Disp: 90 tablet, Rfl: 1   anastrozole (ARIMIDEX) 1 MG tablet, Take 1 tablet (1 mg total) by mouth daily., Disp: 90 tablet, Rfl: 3   aspirin 81 MG EC tablet, Take 1 tablet (81 mg total) by mouth daily., Disp: 30 tablet, Rfl: 0   atorvastatin (LIPITOR) 20 MG tablet, Take 20 mg by mouth daily., Disp: , Rfl:    Blood Pressure Monitor DEVI, Please provide patient with insurance approved blood pressure monitor. I10.0, Disp: 1 each, Rfl: 0   diclofenac Sodium (VOLTAREN) 1 % GEL, Apply 4 g topically 4 (four)  times daily., Disp: 100 g, Rfl: 1   escitalopram (LEXAPRO) 10 MG tablet, Take 1 tablet (10 mg total) by mouth daily. DEPRESSION, Disp: 30 tablet, Rfl: 2   fluticasone (FLONASE) 50 MCG/ACT nasal spray, Place 2 sprays into both nostrils daily., Disp: 16 g, Rfl: 6   HYDROcodone-acetaminophen (NORCO) 5-325 MG tablet, Take 1 tablet by mouth every 6 (six) hours as needed (post op pain). (Patient not taking: Reported on 04/12/2021), Disp: 20 tablet, Rfl: 0   Insulin Pen Needle (TRUEPLUS PEN NEEDLES) 31G X 8 MM MISC, Inject into the skin once nightly.  E11.65, Disp: 100 each, Rfl: 1   LANTUS SOLOSTAR 100 UNIT/ML Solostar Pen, Inject 5 Units into the skin at bedtime., Disp: 15 mL, Rfl: 2   lidocaine (LIDODERM) 5 %, Place 1 patch onto the skin  daily. Remove & Discard patch within 12 hours or as directed by MD, Disp: 30 patch, Rfl: 0   losartan (COZAAR) 50 MG tablet, Take 1 tablet (50 mg total) by mouth daily., Disp: 90 tablet, Rfl: 0   metoprolol succinate (TOPROL-XL) 50 MG 24 hr tablet, TAKE ONE TABLET BY MOUTH EVERY DAY *TAKE WITH OR IMMEDIATELY FOLLOWING A MEAL*, Disp: 90 tablet, Rfl: 1   Misc. Devices MISC, Please provide patient with nebulizer mask and tubing. ICD-10J44.9, Disp: 1 each, Rfl: 0   Multiple Vitamin (MULTIVITAMIN WITH MINERALS) TABS tablet, Take 1 tablet by mouth daily. [place], Disp: , Rfl:    nitroGLYCERIN (NITROSTAT) 0.4 MG SL tablet, Place 1 tablet (0.4 mg total) under the tongue every 5 (five) minutes as needed for chest pain., Disp: 10 tablet, Rfl: 0   Omega-3 Fatty Acids (FISH OIL) 1000 MG CAPS, Take 1,000 mg by mouth daily., Disp: , Rfl:    oxyCODONE-acetaminophen (PERCOCET/ROXICET) 5-325 MG tablet, Take 1 tablet by mouth every 6 (six) hours as needed for severe pain. (Patient not taking: Reported on 04/12/2021), Disp: 15 tablet, Rfl: 0   pantoprazole (PROTONIX) 40 MG tablet, Take 1 tablet (40 mg total) by mouth daily. For acid reflux, Disp: 90 tablet, Rfl: 1   Respiratory Therapy Supplies (NEBULIZER MASK ADULT) MISC, 1 Units by Does not apply route daily as needed. ICD 10 J44.9, Disp: 1 each, Rfl: 0   SYMBICORT 160-4.5 MCG/ACT inhaler, INHALE ONE PUFF BY MOUTH TWICE DAILY AS NEEDED FOR RESPIRATORY ISSUES., Disp: 10.2 g, Rfl: 6   VICTOZA 18 MG/3ML SOPN, INJECT 1.8 MG INTO THE SKIN DAILY, Disp: 9 mL, Rfl: 2  Observations/Objective: Patient is well-developed, well-nourished in no acute distress.  Resting comfortably at home.  Head is normocephalic, atraumatic.  Labored breathing noted due to cough.  Speech is clear and coherent with logical content. Was able to speak in full sentences but would cough consistently between talking Patient is alert and oriented at baseline.   Assessment and Plan: 1. COPD with acute  exacerbation (HCC) - doxycycline (VIBRA-TABS) 100 MG tablet; Take 1 tablet (100 mg total) by mouth 2 (two) times daily.  Dispense: 20 tablet; Refill: 0 - predniSONE (DELTASONE) 20 MG tablet; Take 2 tablets (40 mg total) by mouth daily with breakfast.  Dispense: 14 tablet; Refill: 0 - benzonatate (TESSALON) 100 MG capsule; Take 1 capsule (100 mg total) by mouth 3 (three) times daily as needed.  Dispense: 30 capsule; Refill: 0  2. Chronic obstructive pulmonary disease, unspecified COPD type (HCC) - albuterol (PROVENTIL) (2.5 MG/3ML) 0.083% nebulizer solution; Take 3 mLs (2.5 mg total) by nebulization every 6 (six) hours as needed for wheezing or shortness  of breath.  Dispense: 75 mL; Refill: 3  - Advised patient she may benefit most by ER evaluation and breathing treatments, patient declines - Will send Doxycycline and prednisone for treatment - Continue inhaler and nebulizer treatments  - Steam and humidifier can help - Push fluids - Rest - Advised if no improvement in 24 hours or if symptoms worsen at anytime she needs to proceed to the ER  Follow Up Instructions: I discussed the assessment and treatment plan with the patient. The patient was provided an opportunity to ask questions and all were answered. The patient agreed with the plan and demonstrated an understanding of the instructions.  A copy of instructions were sent to the patient via MyChart unless otherwise noted below.    The patient was advised to call back or seek an in-person evaluation if the symptoms worsen or if the condition fails to improve as anticipated.  Time:  I spent 23 minutes with the patient via telehealth technology discussing the above problems/concerns.    Mar Daring, PA-C

## 2021-08-02 NOTE — Telephone Encounter (Signed)
   Chief Complaint: cough 3 weeks Symptoms: cough white phlegm Frequency: very freq Pertinent Negatives: Patient denies fever Disposition: '[]'$ ED /'[]'$ Urgent Care (no appt availability in office) / '[]'$ Appointment(In office/virtual)/ '[x]'$  [facility]/ '[]'$ Home Care/ '[]'$ Refused Recommended Disposition /'[x]'$ [place] [facility]/ '[]'$  Follow-up with PCP Additional Notes: No appts available, pt refused bus do to transportation. Virtual UC visit scheduled for this morning.  Reason for Disposition  [1] MILD difficulty breathing (e.g., minimal/no SOB at rest, SOB with walking, pulse <100) AND [2] still present when not coughing  Answer Assessment - Initial Assessment Questions 1. ONSET: "When did the cough begin?"      3 weeks 2. SEVERITY: "How bad is the cough today?"      Cough a lot 3. SPUTUM: "Describe the color of your sputum" (none, dry cough; clear, white, yellow, green)     Mostly white, sometimes yellow 4. HEMOPTYSIS: "Are you coughing up any blood?" If so ask: "How much?" (flecks, streaks, tablespoons, etc.)     no 5. DIFFICULTY BREATHING: "Are you having difficulty breathing?" If Yes, ask: "How bad is it?" (e.g., mild, moderate, severe)    - MILD: No SOB at rest, mild SOB with walking, speaks normally in sentences, can lie down, no retractions, pulse < 100.    - MODERATE: SOB at rest, SOB with minimal exertion and prefers to sit, cannot lie down flat, speaks in phrases, mild retractions, audible wheezing, pulse 100-120.    - SEVERE: Very SOB at rest, speaks in single words, struggling to breathe, sitting hunched forward, retractions, pulse > 120      SOB using breathing treatment 6. FEVER: "Do you have a fever?" If Yes, ask: "What is your temperature, how was it measured, and when did it start?"     no 7. CARDIAC HISTORY: "Do you have any history of heart disease?" (e.g., heart attack, congestive heart failure)      no 8. LUNG HISTORY: "Do you have any history of lung disease?"   (e.g., pulmonary embolus, asthma, emphysema)     COPD 9. PE RISK FACTORS: "Do you have a history of blood clots?" (or: recent major surgery, recent prolonged travel, bedridden)     no 10. OTHER SYMPTOMS: "Do you have any other symptoms?" (e.g., runny nose, wheezing, chest pain)       Slightly wheezing 11. PREGNANCY: "Is there any chance you are pregnant?" "When was your last menstrual period?"       na 12. TRAVEL: "Have you traveled out of the country in the last month?" (e.g., travel history, exposures)       na  Protocols used: Cough - Acute Productive-A-AH

## 2021-08-02 NOTE — Telephone Encounter (Signed)
  Chief Complaint: worsening shortness of breath and cough requesting medication  Symptoms: speaks in a few words at a time. Cough white phlegm. Difficulty breathing.  Frequency: since last week called in on 07/31/21 Pertinent Negatives: Patient denies na  Disposition: '[x]'$ ED /'[]'$ Urgent Care (no appt availability in office) / '[]'$ Appointment(In office/virtual)/ '[]'$  [facility]/ '[]'$ Home Care/ '[x]'$ Refused Recommended Disposition /'[]'$ [place] [facility]/ '[]'$  Follow-up with PCP Additional Notes:   Recommended ED due to worsening difficulty breathing. VV appt . Already scheduled today . Recommended mobile bus if she refused ED. Please advise.     Reason for Disposition  [1] MODERATE difficulty breathing (e.g., speaks in phrases, SOB even at rest, pulse 100-120) AND [2] NEW-onset or WORSE than normal  Answer Assessment - Initial Assessment Questions 1. RESPIRATORY STATUS: "Describe your breathing?" (e.g., wheezing, shortness of breath, unable to speak, severe coughing)      Shortness of breath, coughing  2. ONSET: "When did this breathing problem begin?"      Last week called in 07/31/21 for same issues 3. PATTERN "Does the difficult breathing come and go, or has it been constant since it started?"      Na  4. SEVERITY: "How bad is your breathing?" (e.g., mild, moderate, severe)    - MILD: No SOB at rest, mild SOB with walking, speaks normally in sentences, can lie down, no retractions, pulse < 100.    - MODERATE: SOB at rest, SOB with minimal exertion and prefers to sit, cannot lie down flat, speaks in phrases, mild retractions, audible wheezing, pulse 100-120.    - SEVERE: Very SOB at rest, speaks in single words, struggling to breathe, sitting hunched forward, retractions, pulse > 120      Shortness of breath now coughing speaks in a few words at a time 5. RECURRENT SYMPTOM: "Have you had difficulty breathing before?" If Yes, ask: "When was the last time?" and "What happened that time?"       Yes  6. CARDIAC HISTORY: "Do you have any history of heart disease?" (e.g., heart attack, angina, bypass surgery, angioplasty)      See hx 7. LUNG HISTORY: "Do you have any history of lung disease?"  (e.g., pulmonary embolus, asthma, emphysema)     See hx 8. CAUSE: "What do you think is causing the breathing problem?"      na 9. OTHER SYMPTOMS: "Do you have any other symptoms? (e.g., dizziness, runny nose, cough, chest pain, fever)     Cough  10. O2 SATURATION MONITOR:  "Do you use an oxygen saturation monitor (pulse oximeter) at home?" If Yes, "What is your reading (oxygen level) today?" "What is your usual oxygen saturation reading?" (e.g., 95%)       na 11. PREGNANCY: "Is there any chance you are pregnant?" "When was your last menstrual period?"       na 12. TRAVEL: "Have you traveled out of the country in the last month?" (e.g., travel history, exposures)       na  Protocols used: Breathing Difficulty-A-AH

## 2021-08-09 ENCOUNTER — Other Ambulatory Visit: Payer: Self-pay | Admitting: Family Medicine

## 2021-08-18 IMAGING — CR DG CHEST 2V
2 series · 2 of 2 positions shown · non-contrast
Comparison: 11/17/2018

CLINICAL DATA: Central chest pain, shortness of breath

EXAM:
CHEST - 2 VIEW

[chest lat]
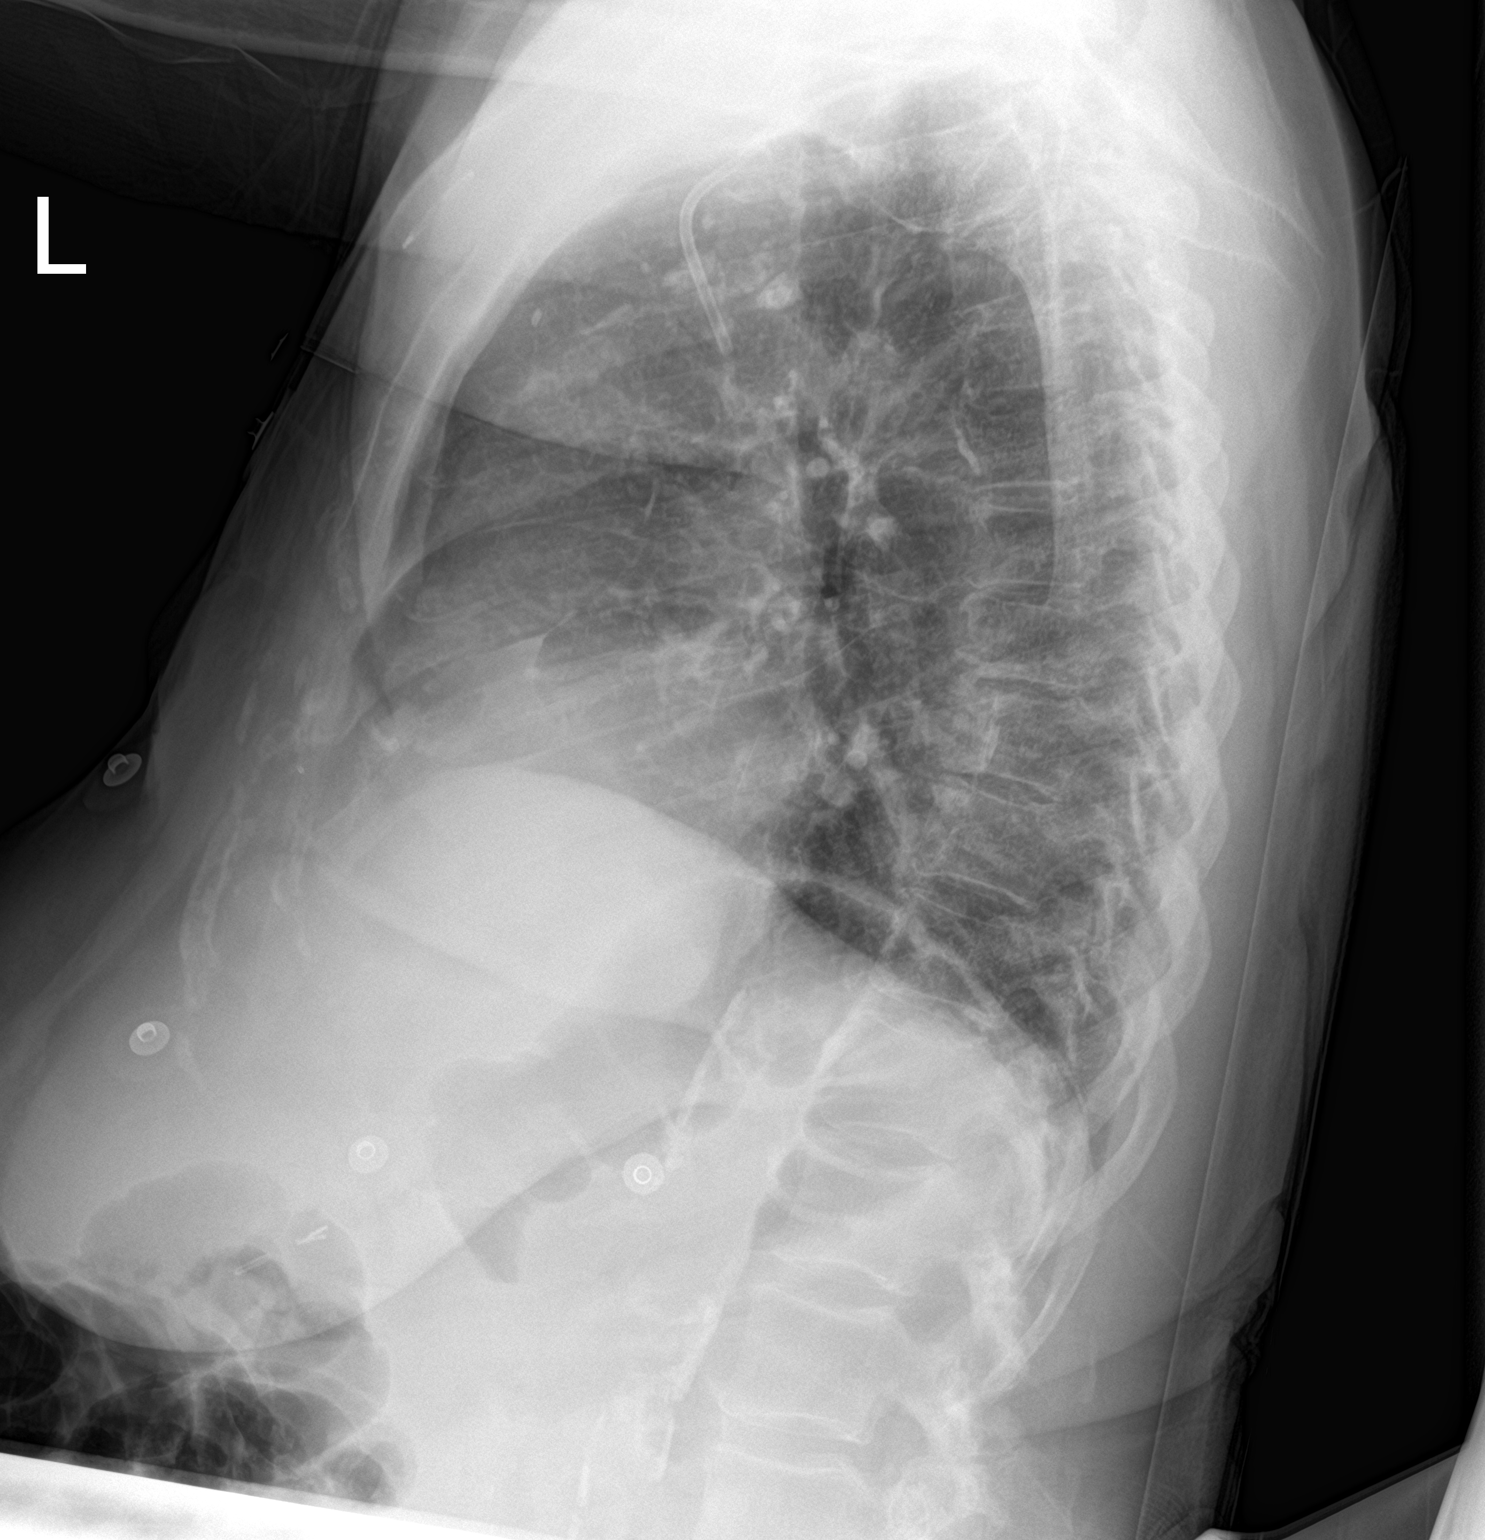

[chest ap]
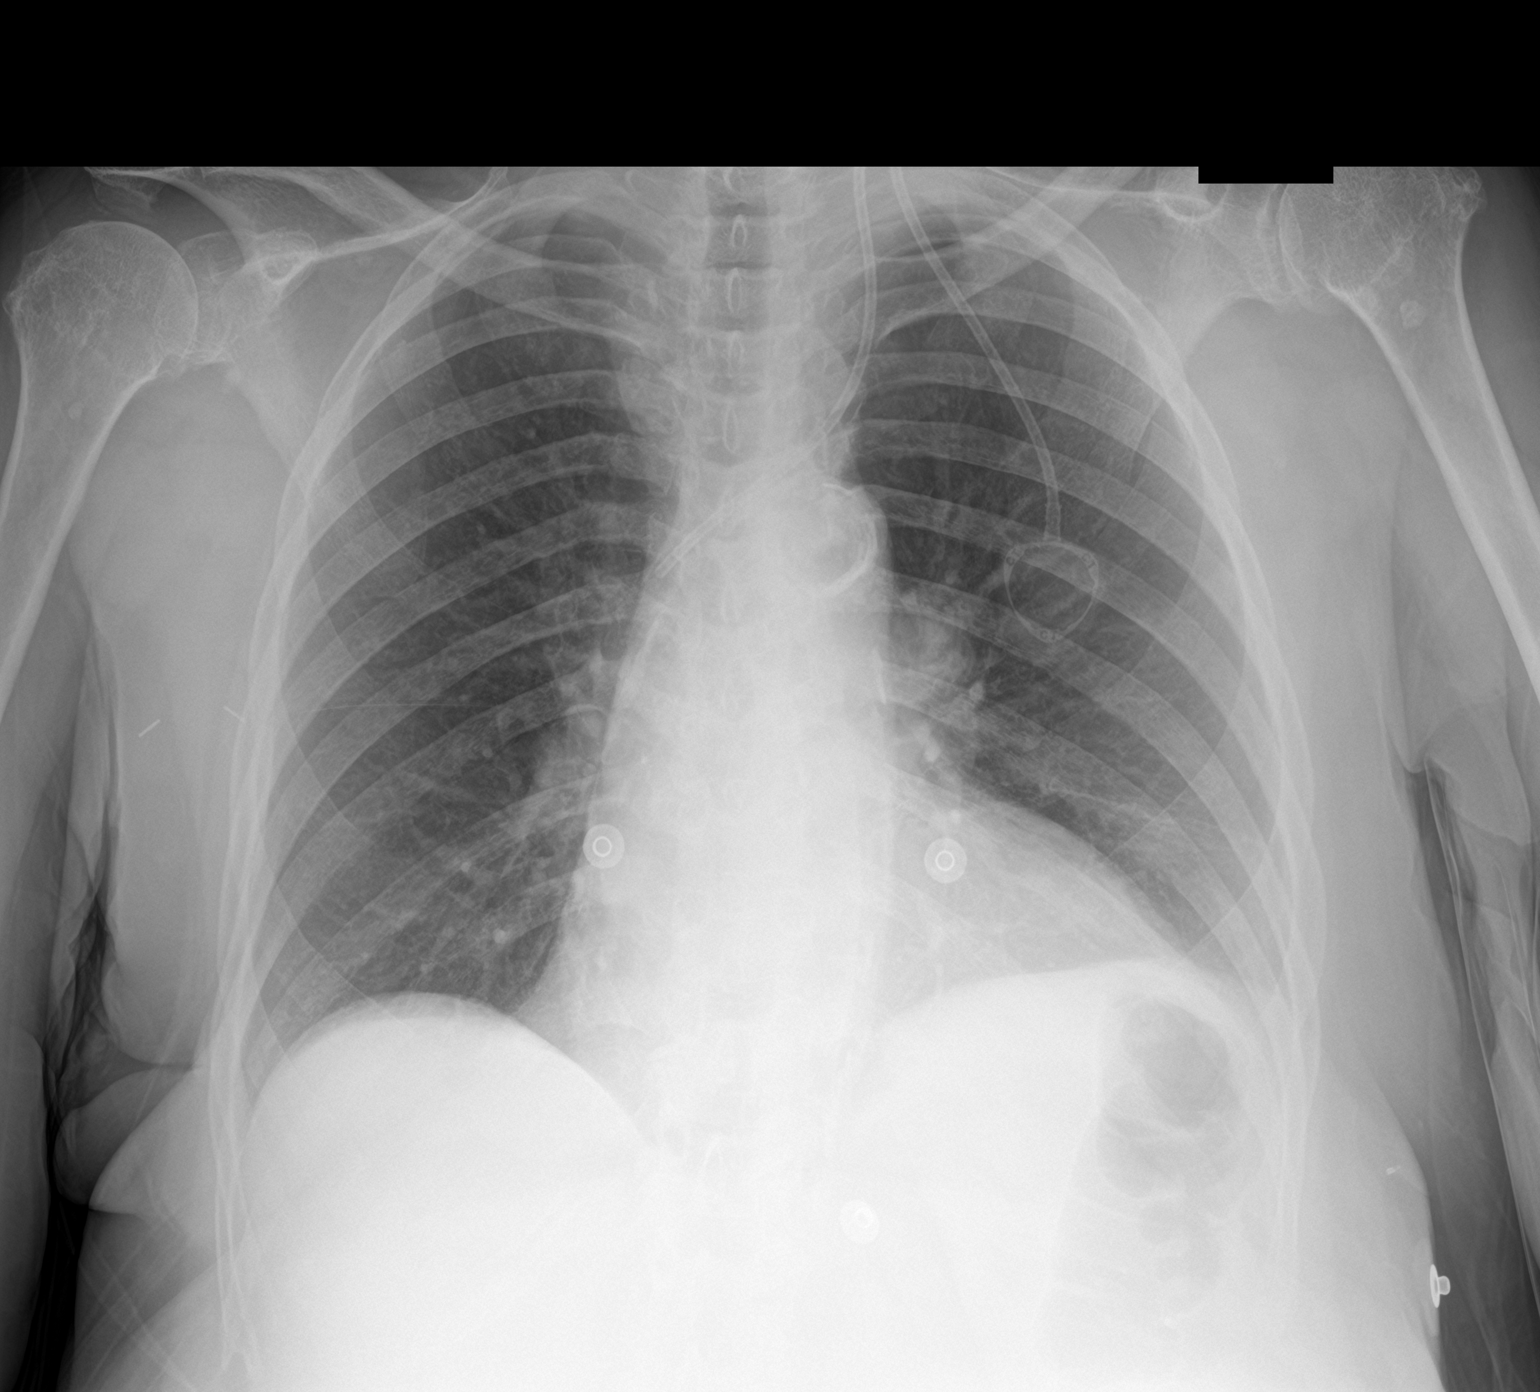

[2 of 2 positions shown; findings below may reference images not displayed]

FINDINGS: Left Port-A-Cath remains in place, unchanged. Aortic
atherosclerosis. Heart is normal size. Linear scarring at the left
base. Right lung clear. No effusions or acute bony abnormality.
IMPRESSION: No active cardiopulmonary disease.

## 2021-08-19 IMAGING — NM NM PULMONARY PERF PARTICULATE
8 series · 8 of 8 positions shown · non-contrast
Comparison: Chest x-ray dated 12/08/2018

CLINICAL DATA: Shortness of breath.

EXAM:
NUCLEAR MEDICINE PERFUSION LUNG SCAN
TECHNIQUE: Perfusion images were obtained in multiple projections after
intravenous injection of radiopharmaceutical.
Ventilation scans intentionally deferred if perfusion scan and chest
x-ray adequate for interpretation during COVID 19 epidemic.
RADIOPHARMACEUTICALS:  1.55 mCi 7c-AAm MAA IV

[Series 1: ant/post perf · 4.14mm/px · 1 of 1 slices shown (1 of 2)]
[im 1/1]
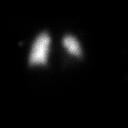

[Series 1: ant/post perf · 4.14mm/px · 1 of 1 slices shown (2 of 2)]
[im 1/1]
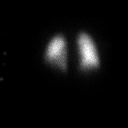

[Series 2: lao/rpo perf · 4.14mm/px · 1 of 1 slices shown (1 of 2)]
[im 1/1]
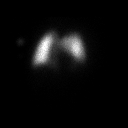

[Series 2: lao/rpo perf · 4.14mm/px · 1 of 1 slices shown (2 of 2)]
[im 1/1]
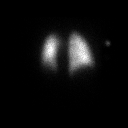

[Series 3: lpo/rao perf · 4.14mm/px · 1 of 1 slices shown (1 of 2)]
[im 1/1]
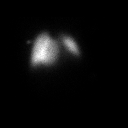

[Series 3: lpo/rao perf · 4.14mm/px · 1 of 1 slices shown (2 of 2)]
[im 1/1]
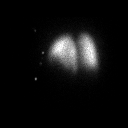

[Series 4: lt lat/rt lat perf · 4.14mm/px · 1 of 1 slices shown (1 of 2)]
[im 1/1]
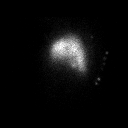

[Series 4: lt lat/rt lat perf · 4.14mm/px · 1 of 1 slices shown (2 of 2)]
[im 1/1]
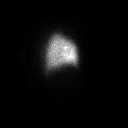

[8 of 8 positions shown; findings below may reference images not displayed]

FINDINGS: There is normal perfusion to both lungs. No findings suggestive of
pulmonary embolism.
IMPRESSION: Normal perfusion lung scan.  No evidence of pulmonary emboli.

## 2021-08-20 ENCOUNTER — Other Ambulatory Visit: Payer: Self-pay | Admitting: Family Medicine

## 2021-08-20 DIAGNOSIS — I10 Essential (primary) hypertension: Secondary | ICD-10-CM

## 2021-08-21 ENCOUNTER — Telehealth: Payer: Self-pay

## 2021-08-21 MED ORDER — ANASTROZOLE 1 MG PO TABS: 1.0000 mg | ORAL_TABLET | Freq: Every day | ORAL | 3 refills | Status: DC

## 2021-08-21 NOTE — Telephone Encounter (Signed)
Pt called requesting refill on medication. Attempted to return pt call and reached VM. LVM for pt to return call.

## 2021-08-22 NOTE — Telephone Encounter (Signed)
Requested Prescriptions  Pending Prescriptions Disp Refills  . losartan (COZAAR) 50 MG tablet [Pharmacy Med Name: losartan 50 mg tablet] 90 tablet 2    Sig: TAKE ONE TABLET BY MOUTH DAILY. STOP '25mg'$  DOSE     Cardiovascular:  Angiotensin Receptor Blockers Failed - 08/20/2021  8:03 AM      Failed - Cr in normal range and within 180 days    Creatinine  Date Value Ref Range Status  03/17/2021 1.95 (H) 0.44 - 1.00 mg/dL Final   Creatinine, Ser  Date Value Ref Range Status  05/17/2021 2.66 (H) 0.44 - 1.00 mg/dL Final         Passed - K in normal range and within 180 days    Potassium  Date Value Ref Range Status  05/17/2021 4.2 3.5 - 5.1 mmol/L Final         Passed - Patient is not pregnant      Passed - Last BP in normal range    BP Readings from Last 1 Encounters:  05/17/21 128/87         Passed - Valid encounter within last 6 months    Recent Outpatient Visits          3 months ago Type 2 diabetes mellitus with complication, without long-term current use of insulin [facility])   [place], Vernia Buff, NP   3 months ago Essential hypertension   [place], Jarome Matin, RPH-CPP   4 months ago Essential hypertension   [place], Vernia Buff, NP   7 months ago Essential hypertension   [place], Vernia Buff, NP   7 months ago Essential hypertension   [place], Vernia Buff, NP

## 2021-08-23 ENCOUNTER — Emergency Department (HOSPITAL_COMMUNITY)
Admission: EM | Admit: 2021-08-23 | Discharge: 2021-08-23 | Disposition: A | Discharge status: Home / Self Care | Payer: Medicare Other | Attending: Emergency Medicine | Admitting: Emergency Medicine

## 2021-08-23 ENCOUNTER — Emergency Department (HOSPITAL_COMMUNITY): Payer: Medicare Other

## 2021-08-23 ENCOUNTER — Encounter (HOSPITAL_COMMUNITY): Payer: Self-pay | Admitting: Emergency Medicine

## 2021-08-23 DIAGNOSIS — J449 Chronic obstructive pulmonary disease, unspecified: Secondary | ICD-10-CM | POA: Diagnosis not present

## 2021-08-23 DIAGNOSIS — I10 Essential (primary) hypertension: Secondary | ICD-10-CM | POA: Diagnosis not present

## 2021-08-23 DIAGNOSIS — Z794 Long term (current) use of insulin: Secondary | ICD-10-CM | POA: Insufficient documentation

## 2021-08-23 DIAGNOSIS — E119 Type 2 diabetes mellitus without complications: Secondary | ICD-10-CM | POA: Diagnosis not present

## 2021-08-23 DIAGNOSIS — R109 Unspecified abdominal pain: Secondary | ICD-10-CM

## 2021-08-23 DIAGNOSIS — Z743 Need for continuous supervision: Secondary | ICD-10-CM | POA: Diagnosis not present

## 2021-08-23 DIAGNOSIS — N261 Atrophy of kidney (terminal): Secondary | ICD-10-CM | POA: Diagnosis not present

## 2021-08-23 DIAGNOSIS — N390 Urinary tract infection, site not specified: Secondary | ICD-10-CM | POA: Diagnosis not present

## 2021-08-23 DIAGNOSIS — Z7951 Long term (current) use of inhaled steroids: Secondary | ICD-10-CM | POA: Diagnosis not present

## 2021-08-23 DIAGNOSIS — Z7982 Long term (current) use of aspirin: Secondary | ICD-10-CM | POA: Insufficient documentation

## 2021-08-23 DIAGNOSIS — R1032 Left lower quadrant pain: Secondary | ICD-10-CM | POA: Diagnosis not present

## 2021-08-23 DIAGNOSIS — I2510 Atherosclerotic heart disease of native coronary artery without angina pectoris: Secondary | ICD-10-CM | POA: Diagnosis not present

## 2021-08-23 DIAGNOSIS — I252 Old myocardial infarction: Secondary | ICD-10-CM | POA: Diagnosis not present

## 2021-08-23 LAB — COMPREHENSIVE METABOLIC PANEL
ALT: 17 U/L (ref 0–44)
AST: 16 U/L (ref 15–41)
Albumin: 3.7 g/dL (ref 3.5–5.0)
Alkaline Phosphatase: 100 U/L (ref 38–126)
Anion gap: 10 (ref 5–15)
BUN: 30 mg/dL — ABNORMAL HIGH (ref 8–23)
CO2: 22 mmol/L (ref 22–32)
Calcium: 10.6 mg/dL — ABNORMAL HIGH (ref 8.9–10.3)
Chloride: 108 mmol/L (ref 98–111)
Creatinine, Ser: 2.3 mg/dL — ABNORMAL HIGH (ref 0.44–1.00)
GFR, Estimated: 22 mL/min — ABNORMAL LOW (ref >60)
Glucose, Bld: 117 mg/dL — ABNORMAL HIGH (ref 70–99)
Potassium: 4.4 mmol/L (ref 3.5–5.1)
Sodium: 140 mmol/L (ref 135–145)
Total Bilirubin: 1.1 mg/dL (ref 0.3–1.2)
Total Protein: 7.7 g/dL (ref 6.5–8.1)

## 2021-08-23 LAB — CBC WITH DIFFERENTIAL/PLATELET
Abs Immature Granulocytes: 0.03 10*3/uL (ref 0.00–0.07)
Basophils Absolute: 0 10*3/uL (ref 0.0–0.1)
Basophils Relative: 1 %
Eosinophils Absolute: 0.3 10*3/uL (ref 0.0–0.5)
Eosinophils Relative: 5 %
HCT: 38.4 % (ref 36.0–46.0)
Hemoglobin: 11.9 g/dL — ABNORMAL LOW (ref 12.0–15.0)
Immature Granulocytes: 1 %
Lymphocytes Relative: 34 %
Lymphs Abs: 2.1 10*3/uL (ref 0.7–4.0)
MCH: 29 pg (ref 26.0–34.0)
MCHC: 31 g/dL (ref 30.0–36.0)
MCV: 93.7 fL (ref 80.0–100.0)
Monocytes Absolute: 0.6 10*3/uL (ref 0.1–1.0)
Monocytes Relative: 9 %
Neutro Abs: 3.3 10*3/uL (ref 1.7–7.7)
Neutrophils Relative %: 50 %
Platelets: 320 10*3/uL (ref 150–400)
RBC: 4.1 MIL/uL (ref 3.87–5.11)
RDW: 15.5 % (ref 11.5–15.5)
WBC: 6.4 10*3/uL (ref 4.0–10.5)
nRBC: 0 % (ref 0.0–0.2)

## 2021-08-23 LAB — URINALYSIS, ROUTINE W REFLEX MICROSCOPIC
Bilirubin Urine: NEGATIVE
Glucose, UA: NEGATIVE mg/dL
Hgb urine dipstick: NEGATIVE
Ketones, ur: NEGATIVE mg/dL
Nitrite: NEGATIVE
Protein, ur: NEGATIVE mg/dL
Specific Gravity, Urine: 1.012 (ref 1.005–1.030)
pH: 5 (ref 5.0–8.0)

## 2021-08-23 MED ORDER — LORAZEPAM 2 MG/ML IJ SOLN
0.5000 mg | Freq: Once | INTRAMUSCULAR | Status: AC
  Administered 2021-08-23: 0.5 mg via INTRAVENOUS
  Filled 2021-08-23: qty 1

## 2021-08-23 MED ORDER — FENTANYL CITRATE PF 50 MCG/ML IJ SOSY
25.0000 ug | PREFILLED_SYRINGE | Freq: Once | INTRAMUSCULAR | Status: AC
  Administered 2021-08-23: 25 ug via INTRAVENOUS
  Filled 2021-08-23: qty 1

## 2021-08-23 MED ORDER — CEPHALEXIN 500 MG PO CAPS: 500.0000 mg | ORAL_CAPSULE | Freq: Two times a day (BID) | ORAL | 0 refills | Status: AC

## 2021-08-23 NOTE — ED Triage Notes (Signed)
Patient BIB [facility] from home for evaluation of left sided blank pain that started yesterday. Patient has taken tylenol at home with no relief. Patient is alert, oriented, and in no apparent distress at this time. EMS Vitals BP 134/78 HR 88 97% on room air

## 2021-08-23 NOTE — ED Provider Triage Note (Signed)
Emergency Medicine Provider Triage Evaluation Note  [name] , a 71 y.o. female  was evaluated in triage.  Pt complains of left flank pain.  Is been going on for "some time".  It worsened yesterday after leaving the cardiologist's office.  Pain is left flank, does not radiate elsewhere.  Denies chest pain or shortness of breath, not having any dysuria or hematuria..  Review of Systems  Per HPI  Physical Exam  LMP  (LMP Unknown) Comment: tubal ligation Gen:   Awake, patient is tearful Resp:  Normal effort  MSK:   Moves extremities without difficulty  Other:  Left CVA tenderness, lungs clear to auscultation bilaterally.  Medical Decision Making  Medically screening exam initiated at 10:04 AM.  Appropriate orders placed.  [name] was informed that the remainder of the evaluation will be completed by another provider, this initial triage assessment does not replace that evaluation, and the importance of remaining in the ED until their evaluation is complete.     Sherrill Raring, PA-C 08/23/21 1005

## 2021-08-23 NOTE — Discharge Instructions (Addendum)
An antibiotic has been sent to your pharmacy by the name of Keflex.  Please take 1 capsule every 6 hours for the next 7 days.  Always take with plenty of food and water.  The culture of your urine is pending.  If there is any recommended changes to your antibiotic therapy, you will receive a phone call with further instructions.  Follow-up with your PCP within the next 2 to 3 days for reevaluation continue medical management.  You have also been provided the contact information for a local urologist.  You may follow-up with them within the next few days for consultation.  Return to the ED for new or worsening symptoms as discussed.

## 2021-08-23 NOTE — ED Notes (Signed)
Spoke with pt's niece, Annie Main, stated she is en route to pick up pt. Ambulatory to wheelchair without difficulty.

## 2021-08-23 NOTE — ED Provider Notes (Signed)
[facility] EMERGENCY DEPARTMENT Provider Note   CSN: [id number] Arrival date & time: 08/23/21  8657     History  Chief Complaint  Patient presents with   Flank Pain    [name] is a tearful and anxious appearing 71 y.o. female with Hx of hyperlipidemia, CAD, asthma, cocaine abuse, prior MI, COPD, DMT2, fatty liver disease, HTN, GERD, IDA.  Presenting today with complaints of left-sided flank and left lower abdominal pain started suddenly yesterday.  Denies fever, chills, N/V/D, changes in urinary or bowel habits, back pain.  Pain described as sharp, comes and goes.  Denies having pain like this before.  No known Hx of renal calculi or diverticulosis.  The history is provided by the patient and medical records.  Flank Pain       Home Medications Prior to Admission medications   Medication Sig Start Date End Date Taking? Authorizing Provider  albuterol (PROVENTIL) (2.5 MG/3ML) 0.083% nebulizer solution Take 3 mLs (2.5 mg total) by nebulization every 6 (six) hours as needed for wheezing or shortness of breath. 08/02/21   Mar Daring, PA-C  amLODipine (NORVASC) 10 MG tablet TAKE ONE TABLET BY MOUTH EVERY DAY 08/02/21   Gildardo Pounds, NP  anastrozole (ARIMIDEX) 1 MG tablet Take 1 tablet (1 mg total) by mouth daily. 08/21/21   Nicholas Lose, MD  aspirin 81 MG EC tablet Take 1 tablet (81 mg total) by mouth daily. 10/13/18   Pokhrel, Corrie Mckusick, MD  atorvastatin (LIPITOR) 20 MG tablet Take 20 mg by mouth daily.    [provider]  benzonatate (TESSALON) 100 MG capsule Take 1 capsule (100 mg total) by mouth 3 (three) times daily as needed. 08/02/21   Mar Daring, PA-C  Blood Pressure Monitor DEVI Please provide patient with insurance approved blood pressure monitor. I10.0 11/15/20   Gildardo Pounds, NP  diclofenac Sodium (VOLTAREN) 1 % GEL Apply 4 g topically 4 (four) times daily. 09/09/20   Hans Eden, NP  doxycycline (VIBRA-TABS) 100 MG  tablet Take 1 tablet (100 mg total) by mouth 2 (two) times daily. 08/02/21   Mar Daring, PA-C  escitalopram (LEXAPRO) 10 MG tablet Take 1 tablet (10 mg total) by mouth daily. DEPRESSION 03/29/21   Gildardo Pounds, NP  fluticasone (FLONASE) 50 MCG/ACT nasal spray Place 2 sprays into both nostrils daily. 05/10/21   Charlott Rakes, MD  HYDROcodone-acetaminophen (NORCO) 5-325 MG tablet Take 1 tablet by mouth every 6 (six) hours as needed (post op pain). Patient not taking: Reported on 04/12/2021 03/10/21   Shuford, Olivia Mackie, PA-C  Insulin Pen Needle (EASY TOUCH PEN NEEDLES) 31G X 8 MM MISC USE TWICE DAILY AS DIRECTED 08/09/21   Charlott Rakes, MD  LANTUS SOLOSTAR 100 UNIT/ML Solostar Pen Inject 5 Units into the skin at bedtime. 03/31/21   Charlott Rakes, MD  lidocaine (LIDODERM) 5 % Place 1 patch onto the skin daily. Remove & Discard patch within 12 hours or as directed by MD 04/12/21   Regan Lemming, MD  losartan (COZAAR) 50 MG tablet TAKE ONE TABLET BY MOUTH DAILY. STOP '25mg'$  DOSE 08/22/21   Gildardo Pounds, NP  metoprolol succinate (TOPROL-XL) 50 MG 24 hr tablet TAKE ONE TABLET BY MOUTH EVERY DAY *TAKE WITH OR IMMEDIATELY FOLLOWING A MEAL* 04/26/21   Charlott Rakes, MD  Misc. Devices MISC Please provide patient with nebulizer mask and tubing. [id number] 12/14/19   Gildardo Pounds, NP  Multiple Vitamin (MULTIVITAMIN WITH MINERALS) TABS tablet Take  1 tablet by mouth daily. Sentry    [provider]  nitroGLYCERIN (NITROSTAT) 0.4 MG SL tablet Place 1 tablet (0.4 mg total) under the tongue every 5 (five) minutes as needed for chest pain. 03/15/20   Gildardo Pounds, NP  Omega-3 Fatty Acids (FISH OIL) 1000 MG CAPS Take 1,000 mg by mouth daily.    [provider]  oxyCODONE-acetaminophen (PERCOCET/ROXICET) 5-325 MG tablet Take 1 tablet by mouth every 6 (six) hours as needed for severe pain. Patient not taking: Reported on 04/12/2021 03/14/21   Dorie Rank, MD  pantoprazole (PROTONIX) 40 MG tablet  Take 1 tablet (40 mg total) by mouth daily. For acid reflux 05/12/21 08/10/21  Gildardo Pounds, NP  predniSONE (DELTASONE) 20 MG tablet Take 2 tablets (40 mg total) by mouth daily with breakfast. 08/02/21   Mar Daring, PA-C  Respiratory Therapy Supplies (NEBULIZER MASK ADULT) MISC 1 Units by Does not apply route daily as needed. ICD 10 J44.9 05/08/21   Gildardo Pounds, NP  SYMBICORT 160-4.5 MCG/ACT inhaler INHALE ONE PUFF BY MOUTH TWICE DAILY AS NEEDED FOR RESPIRATORY ISSUES. 04/10/21   Gildardo Pounds, NP  VICTOZA 18 MG/3ML SOPN INJECT 1.8 MG INTO THE SKIN DAILY 05/29/21   Gildardo Pounds, NP      Allergies    Patient has no known allergies.    Review of Systems   Review of Systems  Genitourinary:  Positive for flank pain.    Physical Exam Updated Vital Signs BP (!) 139/101   Pulse 62   Temp 98.1 F (36.7 C)   Resp 16   LMP  (LMP Unknown) Comment: tubal ligation  SpO2 99%  Physical Exam Vitals and nursing note reviewed.  Constitutional:      General: She is not in acute distress.    Appearance: She is well-developed. She is not ill-appearing, toxic-appearing or diaphoretic.  HENT:     Head: Normocephalic and atraumatic.     Nose: Nose normal.     Mouth/Throat:     Mouth: Mucous membranes are moist.     Pharynx: Oropharynx is clear.  Eyes:     Conjunctiva/sclera: Conjunctivae normal.  Cardiovascular:     Rate and Rhythm: Normal rate and regular rhythm.     Pulses: Normal pulses.     Heart sounds: Normal heart sounds. No murmur heard. Pulmonary:     Effort: Pulmonary effort is normal. No respiratory distress.     Breath sounds: Normal breath sounds. No wheezing.  Chest:     Chest wall: No tenderness.  Abdominal:     Palpations: Abdomen is soft.     Tenderness: There is abdominal tenderness. There is no right CVA tenderness or rebound.     Comments: Left flank, left-abdominal tenderness  Musculoskeletal:        General: No swelling.     Cervical back: Neck  supple.  Skin:    General: Skin is warm and dry.     Capillary Refill: Capillary refill takes less than 2 seconds.     Coloration: Skin is not jaundiced or pale.  Neurological:     Mental Status: She is alert and oriented to person, place, and time.  Psychiatric:        Mood and Affect: Mood is anxious. Affect is tearful.     ED Results / Procedures / Treatments   Labs (all labs ordered are listed, but only abnormal results are displayed) Labs Reviewed  COMPREHENSIVE METABOLIC PANEL - Abnormal; Notable  for the following components:      Result Value   Glucose, Bld 117 (*)    BUN 30 (*)    Creatinine, Ser 2.30 (*)    Calcium 10.6 (*)    GFR, Estimated 22 (*)    All other components within normal limits  CBC WITH DIFFERENTIAL/PLATELET - Abnormal; Notable for the following components:   Hemoglobin 11.9 (*)    All other components within normal limits  URINALYSIS, ROUTINE W REFLEX MICROSCOPIC - Abnormal; Notable for the following components:   APPearance HAZY (*)    Leukocytes,Ua LARGE (*)    Bacteria, UA FEW (*)    All other components within normal limits    EKG None  Radiology CT Renal Stone Study  Result Date: 08/23/2021 CLINICAL DATA:  Left flank pain. EXAM: CT ABDOMEN AND PELVIS WITHOUT CONTRAST TECHNIQUE: Multidetector CT imaging of the abdomen and pelvis was performed following the standard protocol without IV contrast. RADIATION DOSE REDUCTION: This exam was performed according to the departmental dose-optimization program which includes automated exposure control, adjustment of the mA and/or kV according to patient size and/or use of iterative reconstruction technique. COMPARISON:  10/02/2018 FINDINGS: Lower chest: Unremarkable. Hepatobiliary: No suspicious focal abnormality in the liver on this study without intravenous contrast. Pneumobilia suggests prior sphincterotomy. Gallbladder surgically absent. No intrahepatic or extrahepatic biliary dilation. Pancreas: No focal  mass lesion. No dilatation of the main duct. No intraparenchymal cyst. No peripancreatic edema. Spleen: No splenomegaly. No focal mass lesion. Adrenals/Urinary Tract: No adrenal nodule or mass. Mild atrophy noted left kidney. Stranding in the perinephric fat around the left kidney is new in the interval but nonspecific. No urinary stone disease. No hydronephrosis in either kidney. No evidence for hydroureter. The urinary bladder appears normal for the degree of distention. Stomach/Bowel: Stomach is unremarkable. No gastric wall thickening. No evidence of outlet obstruction. Duodenum is normally positioned as is the ligament of Treitz. No small bowel wall thickening. No small bowel dilatation. Sequelae of right hemicolectomy again noted. No gross colonic mass. No colonic wall thickening. Vascular/Lymphatic: There is moderate atherosclerotic calcification of the abdominal aorta without aneurysm. There is no gastrohepatic or hepatoduodenal ligament lymphadenopathy. No retroperitoneal or mesenteric lymphadenopathy. No pelvic sidewall lymphadenopathy. Reproductive: Unremarkable. Other: No intraperitoneal free fluid. Musculoskeletal: Small bilateral groin hernias contain only fat No worrisome lytic or sclerotic osseous abnormality. IMPRESSION: 1. No acute findings in the abdomen or pelvis. Specifically, no definite findings to explain the patient's history of left flank pain. There is some nonspecific stranding around the left kidney, new since 2020. Correlation with urinalysis to exclude urinary tract infection suggested. 2. Interval cholecystectomy. Pneumobilia on today's study suggests prior sphincterotomy. 3. Status post right hemicolectomy. 4. Aortic Atherosclerosis (ICD10-I70.0). Electronically Signed   By: Misty Stanley M.D.   On: 08/23/2021 11:40    Procedures Procedures    Medications Ordered in ED Medications - No data to display  ED Course/ Medical Decision Making/ A&P                            Medical Decision Making Amount and/or Complexity of Data Reviewed Labs: ordered.  Risk Prescription drug management.   71 y.o. female presents to the ED for concern of Flank Pain     This involves an extensive number of treatment options, and is a complaint that carries with it a high risk of complications and morbidity.  The emergent differential diagnosis prior to evaluation includes, but  is not limited to: Renal calculi, pyelonephritis, diverticulitis, bowel obstruction, bowel perforation  This is not an exhaustive differential.   Past Medical History / Co-morbidities / Social History: Hx of hyperlipidemia, CAD, asthma, cocaine abuse, prior MI, COPD, DMT2, fatty liver disease, HTN, GERD, IDA Social Determinants of Health include: elderly  Additional History:  Internal and external records from outside source obtained and reviewed including ED visits which do not indicate prior history of diverticulitis or renal calculi  Lab Tests: I ordered, and personally interpreted labs.  The pertinent results include:   CBC: WBC 6.4, no leukocytosis CMP/BMP: Creatinine 2.30 BUN 30 GFR 22 Urinalysis: Large leukocytes with some bacteria, without hematuria  Imaging Studies: I ordered imaging studies including CT abdomen pelvis.   I independently visualized and interpreted imaging which showed 1. No acute findings in the abdomen or pelvis. Specifically, no definite findings to explain the patient's history of left flank pain. There is some nonspecific stranding around the left kidney, new since 2020. Correlation with urinalysis to exclude urinary tract infection suggested. 2. Interval cholecystectomy. Pneumobilia on today's study suggests prior sphincterotomy. 3. Status post right hemicolectomy. 4. Aortic Atherosclerosis I agree with the radiologist interpretation.  ED Course / Critical Interventions: Pt tearful, anxious-appearing on exam.  Complaining of left flank/LLQ tenderness.  Pain is  intermittent.  Without changes in urinary or bowel habits.  Patient is nontoxic, nonseptic appearing, in no apparent distress.  Patient's pain and other symptoms adequately managed in ED.  Labs, history and vitals reviewed.  Patient does not meet the SIRS or Sepsis criteria.  Plan for CT imaging and reassess. On repeat exam patient does not have a surgical abdomen and there are no peritoneal signs.  Moderate improvement appreciated.  CT demonstrated decreasing new nonspecific mild stranding of left kidney without clear evidence of pyelonephritis, recommended correlation with urine for possible UTI.  Otherwise CT negative for acute pathology.  No CVA tenderness, fever, chills.  Lower suspicion of pyelonephritis.  No renal calculi appreciated.  Urinalysis suggestive of UTI, urine culture pending.  Recommend treatment as UTI with close PCP/urology follow up for re-evaluation and continued management.  Continued conservative symptomatic management discussed.  Patient satisfied with today's encounter.  I have reviewed the patients home medicines and have made adjustments as needed.  Disposition: Considered admission and after reviewing the patient's encounter today, I feel that the patient would benefit from antibiotic for UTI, with close PCP/urology follow-up.  Discussed course of treatment with the patient, whom demonstrated understanding.  Patient in agreement and has no further questions.    I discussed this case with my attending, Dr. Alvino Chapel, who agreed with the proposed treatment course and cosigned this note including patient's presenting symptoms, physical exam, and planned diagnostics and interventions.  Attending physician stated agreement with plan or made changes to plan which were implemented.     This chart was dictated using voice recognition software.  Despite best efforts to proofread, errors can occur which can change the documentation meaning.         Final Clinical Impression(s) /  ED Diagnoses Final diagnoses:  Urinary tract infection without hematuria, site unspecified  Acute left flank pain    Rx / DC Orders ED Discharge Orders          Ordered    cephALEXin (KEFLEX) 500 MG capsule  2 times daily        08/23/21 2036              Dorise Bullion  Curt Jews 08/27/21 8270    Davonna Belling, MD 08/28/21 [phone]

## 2021-08-25 LAB — URINE CULTURE

## 2021-09-04 NOTE — Progress Notes (Signed)
Patient Care Team: Nicholas Lose, MD as PCP - General (Hematology and Oncology) Nicholas Lose, MD as Consulting Physician (Hematology and Oncology)  DIAGNOSIS:  Encounter Diagnoses  Name Primary?   Malignant neoplasm of upper-outer quadrant of right breast in female, estrogen receptor positive ([place])    MGUS (monoclonal gammopathy of unknown significance)     SUMMARY OF ONCOLOGIC HISTORY: Oncology History  Malignant neoplasm of upper-outer quadrant of right breast in female, estrogen receptor positive ([place])  09/03/2017 Initial Diagnosis   Screening mammogram detected 1.4 cm right breast mass at 10 o'clock position, additional pleomorphic calcifications 2 cm posteriorly, additional loosely grouped calcifications 10.1 x 7.1 x 5.3 cm, single right axillary lymph node: Biopsy revealed DCIS ER 100%, PR 50%; IDC grade 2-3 ER 100%, PR 50%, Ki-67 15%, HER-2 positive ratio 2, copy #5, lymph node negative T2 N0 stage Ia clinical stage AJCC 8   09/03/2017 Cancer Staging   Staging form: Breast, AJCC 8th Edition - Clinical stage from 09/03/2017: Stage IB (cT2, cN0, cM0, G3, ER+, PR+, HER2+)   11/27/2017 Surgery   Right mastectomy ([place]) ([id number]): IDC grade 2, 1.6 cm, separate foci of DCIS intermediate grade, margins negative, 1/5 LN positive for micrometasatic carcinoma. ER 100%, PR 50%, Ki-67 15%, HER-2 positive ratio 2, copy #5 T1c N0 stage Ia   11/27/2017 Cancer Staging   Staging form: Breast, AJCC 8th Edition - Pathologic stage from 11/27/2017: Stage IA (pT1c, pN0, cM0, G2, ER+, PR+, HER2+)     02/07/2018 - 03/12/2019 Chemotherapy   trastuzumab (HERCEPTIN) 483 mg in sodium chloride 0.9 % 250 mL chemo infusion, 8 mg/kg = 483 mg, Intravenous,  Once, 11 of 11 cycles. Administration: 483 mg (02/07/2018), 357 mg (02/27/2018), 357 mg (05/01/2018), 357 mg (05/23/2018), 357 mg (06/12/2018), 357 mg (07/03/2018), 357 mg (08/14/2018), 357 mg (09/04/2018), 357 mg (10/17/2018), 357 mg (03/20/2018), 357 mg  (04/10/2018)  trastuzumab-anns (KANJINTI) 357 mg in sodium chloride 0.9 % 250 mL chemo infusion, 6 mg/kg = 357 mg (100 % of original dose 6 mg/kg), Intravenous,  Once, 7 of 7 cycles. Dose modification: 6 mg/kg (original dose 6 mg/kg, Cycle 12, Reason: Other (see comments)). Administration: 357 mg (11/06/2018), 357 mg (11/27/2018), 357 mg (12/18/2018), 357 mg (01/08/2019), 357 mg (01/29/2019), 357 mg (02/19/2019), 357 mg (03/12/2019).   12/2018 - 12/2023 Anti-estrogen oral therapy   Anastrozole     CHIEF COMPLIANT:  Follow-up of MGUS and right breast cancer, complaining of right chest wall pain    INTERVAL HISTORY: [name] is a 71 y.o. 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 She presents to the clinic today for follow-up. Sje sates that she keep having pain in right breast she take tylenol but doesn't relieve the pain. On the scale 1-10 it is a 5. She complains of constant pain.   ALLERGIES:  has No Known Allergies.  MEDICATIONS:  Current Outpatient Medications  Medication Sig Dispense Refill   diclofenac Sodium (VOLTAREN) 1 % GEL Apply 1 Application topically 4 (four) times daily. 100 g 3   albuterol (PROVENTIL) (2.5 MG/3ML) 0.083% nebulizer solution Take 3 mLs (2.5 mg total) by nebulization every 6 (six) hours as needed for wheezing or shortness of breath. 75 mL 3   amLODipine (NORVASC) 10 MG tablet TAKE ONE TABLET BY MOUTH EVERY DAY 90 tablet 1   anastrozole (ARIMIDEX) 1 MG tablet Take 1 tablet (1 mg total) by mouth daily. 90 tablet 3   aspirin 81 MG EC tablet Take 1 tablet (81 mg  total) by mouth daily. 30 tablet 0   atorvastatin (LIPITOR) 20 MG tablet Take 20 mg by mouth daily.     benzonatate (TESSALON) 100 MG capsule Take 1 capsule (100 mg total) by mouth 3 (three) times daily as needed. 30 capsule 0   Blood Pressure Monitor DEVI Please provide patient with insurance approved blood pressure monitor. I10.0 1 each 0   doxycycline (VIBRA-TABS) 100 MG tablet Take 1  tablet (100 mg total) by mouth 2 (two) times daily. 20 tablet 0   escitalopram (LEXAPRO) 10 MG tablet Take 1 tablet (10 mg total) by mouth daily. DEPRESSION 30 tablet 2   fluticasone (FLONASE) 50 MCG/ACT nasal spray Place 2 sprays into both nostrils daily. 16 g 6   HYDROcodone-acetaminophen (NORCO) 5-325 MG tablet Take 1 tablet by mouth every 6 (six) hours as needed (post op pain). (Patient not taking: Reported on 04/12/2021) 20 tablet 0   Insulin Pen Needle (EASY TOUCH PEN NEEDLES) 31G X 8 MM MISC USE TWICE DAILY AS DIRECTED 100 each 0   LANTUS SOLOSTAR 100 UNIT/ML Solostar Pen Inject 5 Units into the skin at bedtime. 15 mL 2   lidocaine (LIDODERM) 5 % Place 1 patch onto the skin daily. Remove & Discard patch within 12 hours or as directed by MD 30 patch 0   losartan (COZAAR) 50 MG tablet TAKE ONE TABLET BY MOUTH DAILY. STOP 12m DOSE 90 tablet 0   metoprolol succinate (TOPROL-XL) 50 MG 24 hr tablet TAKE ONE TABLET BY MOUTH EVERY DAY *TAKE WITH OR IMMEDIATELY FOLLOWING A MEAL* 90 tablet 1   Misc. Devices MISC Please provide patient with nebulizer mask and tubing. ICD-10J44.9 1 each 0   Multiple Vitamin (MULTIVITAMIN WITH MINERALS) TABS tablet Take 1 tablet by mouth daily. Sentry     nitroGLYCERIN (NITROSTAT) 0.4 MG SL tablet Place 1 tablet (0.4 mg total) under the tongue every 5 (five) minutes as needed for chest pain. 10 tablet 0   Omega-3 Fatty Acids (FISH OIL) 1000 MG CAPS Take 1,000 mg by mouth daily.     oxyCODONE-acetaminophen (PERCOCET/ROXICET) 5-325 MG tablet Take 1 tablet by mouth every 6 (six) hours as needed for severe pain. (Patient not taking: Reported on 04/12/2021) 15 tablet 0   pantoprazole (PROTONIX) 40 MG tablet Take 1 tablet (40 mg total) by mouth daily. For acid reflux 90 tablet 1   predniSONE (DELTASONE) 20 MG tablet Take 2 tablets (40 mg total) by mouth daily with breakfast. 14 tablet 0   Respiratory Therapy Supplies (NEBULIZER MASK ADULT) MISC 1 Units by Does not apply route daily  as needed. ICD 10 J44.9 1 each 0   SYMBICORT 160-4.5 MCG/ACT inhaler INHALE ONE PUFF BY MOUTH TWICE DAILY AS NEEDED FOR RESPIRATORY ISSUES. 10.2 g 6   VICTOZA 18 MG/3ML SOPN INJECT 1.8 MG INTO THE SKIN DAILY 9 mL 2   No current facility-administered medications for this visit.    PHYSICAL EXAMINATION: ECOG PERFORMANCE STATUS: 1 - Symptomatic but completely ambulatory  Vitals:   09/13/21 1054  BP: 115/67  Pulse: (!) 59  Resp: 17  Temp: (!) 97.5 F (36.4 C)  SpO2: 98%   Filed Weights   09/13/21 1054  Weight: 127 lb 12.8 oz (58 kg)    BREAST: Right mastectomy scar is very tender to palpation throughout the scar tissue.  No palpable lumps or nodules in the left breast or axilla.  (exam performed in the presence of a chaperone)  LABORATORY DATA:  I have reviewed the data  as listed    Latest Ref Rng & Units 08/23/2021   10:13 AM 05/17/2021   12:31 PM 05/10/2021   11:04 AM  CMP  Glucose 70 - 99 mg/dL 117  120  93   BUN 8 - 23 mg/dL 30  32  25   Creatinine 0.44 - 1.00 mg/dL 2.30  2.66  2.22   Sodium 135 - 145 mmol/L 140  137  141   Potassium 3.5 - 5.1 mmol/L 4.4  4.2  4.8   Chloride 98 - 111 mmol/L 108  105  104   CO2 22 - 32 mmol/L 22  25  22    Calcium 8.9 - 10.3 mg/dL 10.6  9.4  10.4   Total Protein 6.5 - 8.1 g/dL 7.7   7.7   Total Bilirubin 0.3 - 1.2 mg/dL 1.1   0.4   Alkaline Phos 38 - 126 U/L 100   126   AST 15 - 41 U/L 16   13   ALT 0 - 44 U/L 17   9     Lab Results  Component Value Date   WBC 6.4 08/23/2021   HGB 11.9 (L) 08/23/2021   HCT 38.4 08/23/2021   MCV 93.7 08/23/2021   PLT 320 08/23/2021   NEUTROABS 3.3 08/23/2021    ASSESSMENT & PLAN:  Malignant neoplasm of upper-outer quadrant of right breast in female, estrogen receptor positive ([place]) History of breast cancer: Currently on anastrozole therapy and doing well. Breast cancer surveillance: Mammogram 02/09/2021: Benign breast density category B Severe right chest wall pain: I sent a prescription for  Voltaren gel.  She has an appointment with Dr. Ninfa Linden.  MGUS (monoclonal gammopathy of unknown significance) Lab review: 11/14/2020: M spike: 1.9 g, IgG 2884, immunofixation shows IgG kappa Creatinine: 2.38 (baseline creatinine 1.6), calcium 9.7, albumin 4.5, hemoglobin 13.1 11/29/2020: M spike 1.6 g IgG kappa,Kappa lambda ratio: 9 (Kappa 136.9, lambda 15.2),beta-2 microglobulin: 3.4, creatinine 2.3, hemoglobin 12 03/17/2021: M spike: 1.3 g IgG kappa, kappa lambda ratio 7.72 (Kappa 195, lambda 25), creatinine 1.95, calcium 9.9, albumin 3.8, hemoglobin 10   12/09/2020: Bone survey: No lytic lesions   Acute renal failure could be related to underlying cardiac issues. Anemia due to chronic kidney disease We will send for blood work to be done today.  Otherwise return to clinic in 6 months with labs.    Orders Placed This Encounter  Procedures   CBC with Differential ([facility] Only)    Standing Status:   Future    Standing Expiration Date:   09/14/2022   CMP ([place] [facility] only)    Standing Status:   Future    Standing Expiration Date:   09/14/2022   Beta 2 microglobulin, serum    Standing Status:   Future    Standing Expiration Date:   09/14/2022   Multiple Myeloma Panel (SPEP&IFE w/QIG)    Standing Status:   Future    Standing Expiration Date:   09/14/2022   Kappa/lambda light chains    Standing Status:   Future    Standing Expiration Date:   09/14/2022   The patient has a good understanding of the overall plan. she agrees with it. she will call with any problems that may develop before the next visit here. Total time spent: 30 mins including face to face time and time spent for planning, charting and co-ordination of care   Harriette Ohara, MD 09/13/21    I Gardiner Coins am scribing for Dr. Lindi Adie  I  have reviewed the above documentation for accuracy and completeness, and I agree with the above.

## 2021-09-06 DIAGNOSIS — N10 Acute tubulo-interstitial nephritis: Secondary | ICD-10-CM | POA: Diagnosis not present

## 2021-09-06 DIAGNOSIS — N390 Urinary tract infection, site not specified: Secondary | ICD-10-CM | POA: Diagnosis not present

## 2021-09-06 DIAGNOSIS — R1084 Generalized abdominal pain: Secondary | ICD-10-CM | POA: Diagnosis not present

## 2021-09-12 NOTE — Assessment & Plan Note (Signed)
Lab review: 11/14/2020: M spike: 1.9 g, IgG 2884, immunofixation shows IgG kappa Creatinine: 2.38 (baseline creatinine 1.6), calcium 9.7, albumin 4.5, hemoglobin 13.1 11/29/2020: M spike 1.6 g IgG kappa,Kappa lambda ratio: 9 (Kappa 136.9, lambda 15.2),beta-2 microglobulin: 3.4, creatinine 2.3, hemoglobin 12 03/17/2021: M spike: 1.3 g IgG kappa, kappa lambda ratio 7.72 (Kappa 195, lambda 25), creatinine 1.95, calcium 9.9, albumin 3.8, hemoglobin 10  12/09/2020: Bone survey: No lytic lesions  Acute renal failure could be related to underlying cardiac issues. Anemia due to chronic kidney disease

## 2021-09-12 NOTE — Assessment & Plan Note (Signed)
History of breast cancer: Currently on anastrozole therapy and doing well. Breast cancer surveillance: Mammogram 02/09/2021: Benign breast density category B

## 2021-09-13 ENCOUNTER — Other Ambulatory Visit: Payer: Self-pay

## 2021-09-13 ENCOUNTER — Telehealth: Payer: Self-pay

## 2021-09-13 ENCOUNTER — Inpatient Hospital Stay: Payer: Medicare Other

## 2021-09-13 ENCOUNTER — Inpatient Hospital Stay: Payer: Medicare Other | Attending: Hematology and Oncology | Admitting: Hematology and Oncology

## 2021-09-13 DIAGNOSIS — Z170 Estrogen receptor positive status [ER+]: Secondary | ICD-10-CM | POA: Diagnosis not present

## 2021-09-13 DIAGNOSIS — D472 Monoclonal gammopathy: Secondary | ICD-10-CM | POA: Insufficient documentation

## 2021-09-13 DIAGNOSIS — D631 Anemia in chronic kidney disease: Secondary | ICD-10-CM | POA: Diagnosis not present

## 2021-09-13 DIAGNOSIS — N189 Chronic kidney disease, unspecified: Secondary | ICD-10-CM | POA: Diagnosis not present

## 2021-09-13 DIAGNOSIS — Z79811 Long term (current) use of aromatase inhibitors: Secondary | ICD-10-CM | POA: Insufficient documentation

## 2021-09-13 DIAGNOSIS — C50411 Malignant neoplasm of upper-outer quadrant of right female breast: Secondary | ICD-10-CM | POA: Insufficient documentation

## 2021-09-13 LAB — CBC WITH DIFFERENTIAL (CANCER CENTER ONLY)
Abs Immature Granulocytes: 0.03 10*3/uL (ref 0.00–0.07)
Basophils Absolute: 0 10*3/uL (ref 0.0–0.1)
Basophils Relative: 1 %
Eosinophils Absolute: 0.4 10*3/uL (ref 0.0–0.5)
Eosinophils Relative: 6 %
HCT: 37.9 % (ref 36.0–46.0)
Hemoglobin: 12.1 g/dL (ref 12.0–15.0)
Immature Granulocytes: 1 %
Lymphocytes Relative: 36 %
Lymphs Abs: 2.3 10*3/uL (ref 0.7–4.0)
MCH: 29.4 pg (ref 26.0–34.0)
MCHC: 31.9 g/dL (ref 30.0–36.0)
MCV: 92 fL (ref 80.0–100.0)
Monocytes Absolute: 0.7 10*3/uL (ref 0.1–1.0)
Monocytes Relative: 11 %
Neutro Abs: 2.9 10*3/uL (ref 1.7–7.7)
Neutrophils Relative %: 45 %
Platelet Count: 305 10*3/uL (ref 150–400)
RBC: 4.12 MIL/uL (ref 3.87–5.11)
RDW: 15.2 % (ref 11.5–15.5)
WBC Count: 6.2 10*3/uL (ref 4.0–10.5)
nRBC: 0 % (ref 0.0–0.2)

## 2021-09-13 LAB — CMP (CANCER CENTER ONLY)
ALT: 14 U/L (ref 0–44)
AST: 17 U/L (ref 15–41)
Albumin: 4.4 g/dL (ref 3.5–5.0)
Alkaline Phosphatase: 111 U/L (ref 38–126)
Anion gap: 4 — ABNORMAL LOW (ref 5–15)
BUN: 17 mg/dL (ref 8–23)
CO2: 31 mmol/L (ref 22–32)
Calcium: 10 mg/dL (ref 8.9–10.3)
Chloride: 103 mmol/L (ref 98–111)
Creatinine: 2.36 mg/dL — ABNORMAL HIGH (ref 0.44–1.00)
GFR, Estimated: 21 mL/min — ABNORMAL LOW (ref >60)
Glucose, Bld: 101 mg/dL — ABNORMAL HIGH (ref 70–99)
Potassium: 4.9 mmol/L (ref 3.5–5.1)
Sodium: 138 mmol/L (ref 135–145)
Total Bilirubin: 0.7 mg/dL (ref 0.3–1.2)
Total Protein: 8.7 g/dL — ABNORMAL HIGH (ref 6.5–8.1)

## 2021-09-13 MED ORDER — DICLOFENAC SODIUM 1 % EX GEL: 1.0000 | Freq: Four times a day (QID) | CUTANEOUS | 3 refills | Status: DC

## 2021-09-13 NOTE — Telephone Encounter (Signed)
Contacted pt to schedule Medicare Wellness. When contacting pt someone picked up the phone and hung up tried calling back no answer

## 2021-09-14 ENCOUNTER — Telehealth: Payer: Self-pay | Admitting: Hematology and Oncology

## 2021-09-14 ENCOUNTER — Ambulatory Visit: Payer: Medicare Other | Attending: Physician Assistant | Admitting: Physician Assistant

## 2021-09-14 VITALS — BP 110/74 | HR 69 | Temp 98.2°F | Resp 21 | Ht 59.0 in | Wt 127.0 lb

## 2021-09-14 DIAGNOSIS — Z09 Encounter for follow-up examination after completed treatment for conditions other than malignant neoplasm: Secondary | ICD-10-CM | POA: Diagnosis not present

## 2021-09-14 DIAGNOSIS — J449 Chronic obstructive pulmonary disease, unspecified: Secondary | ICD-10-CM | POA: Diagnosis not present

## 2021-09-14 DIAGNOSIS — M79641 Pain in right hand: Secondary | ICD-10-CM

## 2021-09-14 DIAGNOSIS — R319 Hematuria, unspecified: Secondary | ICD-10-CM | POA: Diagnosis not present

## 2021-09-14 DIAGNOSIS — N390 Urinary tract infection, site not specified: Secondary | ICD-10-CM | POA: Diagnosis not present

## 2021-09-14 DIAGNOSIS — E118 Type 2 diabetes mellitus with unspecified complications: Secondary | ICD-10-CM

## 2021-09-14 LAB — KAPPA/LAMBDA LIGHT CHAINS
Kappa free light chain: 272 mg/L — ABNORMAL HIGH (ref 3.3–19.4)
Kappa, lambda light chain ratio: 12.89 — ABNORMAL HIGH (ref 0.26–1.65)
Lambda free light chains: 21.1 mg/L (ref 5.7–26.3)

## 2021-09-14 MED ORDER — TRAMADOL HCL 50 MG PO TABS: 50.0000 mg | ORAL_TABLET | Freq: Three times a day (TID) | ORAL | 0 refills | Status: AC | PRN

## 2021-09-14 MED ORDER — ALBUTEROL SULFATE HFA 108 (90 BASE) MCG/ACT IN AERS: 1.0000 | INHALATION_SPRAY | Freq: Four times a day (QID) | RESPIRATORY_TRACT | 2 refills | Status: AC | PRN

## 2021-09-14 NOTE — Telephone Encounter (Signed)
Copied from [place] [id number]. Topic: General - Other >> Sep 14, 2021 12:42 PM Everette C wrote: Reason for CRM: Rose with [facility] has called to share that additional paperwork will be submitted to the practice related to a prescription for the patient's mobility device / scooter   Rose would like the information reviewed with the patient at their appt today 09/14/21 at 3:30   Acct # [id number]  Please contact further if needed

## 2021-09-14 NOTE — Progress Notes (Signed)
Patient ID: [name], female   DOB: 1950-10-01, 71 y.o.   MRN: [id number]    [name], is a 71 y.o. female  [id number]  [id number]  DOB - 15-Apr-1950  Chief Complaint  Patient presents with   Hospitalization Follow-up    Hospitalized for SOB and Chest pain        Subjective:   [name] is a 71 y.o. female here today for a follow up visit and to establish care after being seen in the ED 7/19 for UTI.  She is feeling some better and is taking her antibiotics.  She denies fever or abdominal pain.  [facility] in follow up on 09/06/2021  Today she is c/o R hand pain secondary to arthritis.    She is also wanting a rescue inhaler.    Blood sugar this morning 101   ED visit notes:  [name] is a tearful and anxious appearing 71 y.o. female with Hx of hyperlipidemia, CAD, asthma, cocaine abuse, prior MI, COPD, DMT2, fatty liver disease, HTN, GERD, IDA.  Presenting today with complaints of left-sided flank and left lower abdominal pain started suddenly yesterday.  Denies fever, chills, N/V/D, changes in urinary or bowel habits, back pain.  Pain described as sharp, comes and goes.  Denies having pain like this before.  No known Hx of renal calculi or diverticulosis.     Lab Tests: I ordered, and personally interpreted labs.  The pertinent results include:   CBC: WBC 6.4, no leukocytosis CMP/BMP: Creatinine 2.30 BUN 30 GFR 22 Urinalysis: Large leukocytes with some bacteria, without hematuria   Imaging Studies: I ordered imaging studies including CT abdomen pelvis.   I independently visualized and interpreted imaging which showed 1. No acute findings in the abdomen or pelvis. Specifically, no definite findings to explain the patient's history of left flank pain. There is some nonspecific stranding around the left kidney, new since 2020. Correlation with urinalysis to exclude urinary tract infection suggested. 2. Interval cholecystectomy.  Pneumobilia on today's study suggests prior sphincterotomy. 3. Status post right hemicolectomy. 4. Aortic Atherosclerosis I agree with the radiologist interpretation.   ED Course / Critical Interventions: Pt tearful, anxious-appearing on exam.  Complaining of left flank/LLQ tenderness.  Pain is intermittent.  Without changes in urinary or bowel habits.  Patient is nontoxic, nonseptic appearing, in no apparent distress.  Patient's pain and other symptoms adequately managed in ED.  Labs, history and vitals reviewed.  Patient does not meet the SIRS or Sepsis criteria.  Plan for CT imaging and reassess. On repeat exam patient does not have a surgical abdomen and there are no peritoneal signs.  Moderate improvement appreciated.  CT demonstrated decreasing new nonspecific mild stranding of left kidney without clear evidence of pyelonephritis, recommended correlation with urine for possible UTI.  Otherwise CT negative for acute pathology.  No CVA tenderness, fever, chills.  Lower suspicion of pyelonephritis.  No renal calculi appreciated.  Urinalysis suggestive of UTI, urine culture pending.  Recommend treatment as UTI with close PCP/urology follow up for re-evaluation and continued management.  Continued conservative symptomatic management discussed.  Patient satisfied with today's encounter.  I have reviewed the patients home medicines and have made adjustments as needed.   Disposition: Considered admission and after reviewing the patient's encounter today, I feel that the patient would benefit from antibiotic for UTI, with close PCP/urology follow-up.  Discussed course of treatment with the patient, whom demonstrated understanding.  Patient in agreement and has no  further questions.    No problems updated.  ALLERGIES: No Known Allergies  PAST MEDICAL HISTORY: Past Medical History:  Diagnosis Date   Ambulates with cane    straight   Angina    no current problems per patient at PAT appt 11/05/18,  nitroglycerin sl 06/30/20   Arthritis    HANDS"   Asthma    Cancer ([place]) 09/04/2017   Right Breast   Carpal tunnel syndrome, bilateral 11/26/2016   Cholelithiasis    Cocaine abuse ([place]) 07/2012   per E.R. drug screen   COPD (chronic obstructive pulmonary disease) ([place])    per patient   Coronary artery disease    Diabetes mellitus without mention of complication    type 2   Dysphagia    esophageal dysmotility on 03/2011 esophagram    Fatty liver disease, nonalcoholic 3086   on Imaging.    GERD (gastroesophageal reflux disease)    Headache    Hearing loss    bilateral - no hearing aids   History of colonic polyps 2009   adenomatous 2009, HP in 2009, 2011, 2013.    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 Authorizing Provider  albuterol (PROVENTIL) (2.5 MG/3ML) 0.083% nebulizer solution Take 3 mLs (2.5 mg total) by nebulization every 6 (six) hours as needed for wheezing or shortness of breath. 08/02/21  Yes Burnette, Clearnce Sorrel, PA-C  albuterol (VENTOLIN HFA) 108 (90 Base) MCG/ACT inhaler Inhale 1-2 puffs into the lungs every 6 (six) hours as needed for wheezing or shortness of breath. 09/14/21  Yes Karson Reede, Levada Dy M, PA-C  amLODipine (NORVASC) 10 MG tablet TAKE ONE TABLET BY MOUTH EVERY DAY 08/02/21  Yes Gildardo Pounds, NP  anastrozole (ARIMIDEX) 1 MG tablet Take 1 tablet (1 mg total) by mouth daily. 08/21/21  Yes Nicholas Lose, MD  aspirin 81 MG EC tablet Take 1 tablet (81 mg total) by mouth daily. 10/13/18  Yes Pokhrel, Laxman, MD  atorvastatin (LIPITOR) 20 MG tablet Take 20 mg by mouth daily.   Yes [provider]  benzonatate (TESSALON) 100 MG capsule Take 1 capsule (100 mg total) by  mouth 3 (three) times daily as needed. 08/02/21  Yes Mar Daring, PA-C  Blood Pressure Monitor DEVI Please provide patient with insurance approved blood pressure monitor. I10.0 11/15/20  Yes Gildardo Pounds, NP  ciprofloxacin (CIPRO) 500 MG tablet Take 500 mg by mouth 2 (two) times daily.   Yes [provider]  diclofenac Sodium (VOLTAREN) 1 % GEL Apply 1 Application topically 4 (four) times daily. 09/13/21  Yes Nicholas Lose, MD  doxycycline (VIBRA-TABS) 100 MG tablet Take 1 tablet (100 mg total) by mouth 2 (two) times daily. 08/02/21  Yes Mar Daring, PA-C  escitalopram (LEXAPRO) 10 MG tablet Take 1 tablet (10 mg total) by mouth daily. DEPRESSION 03/29/21  Yes Gildardo Pounds, NP  fluticasone (FLONASE) 50 MCG/ACT nasal spray Place 2 sprays into both nostrils daily. 05/10/21  Yes Newlin, Charlane Ferretti, MD  Insulin Pen Needle (EASY TOUCH PEN NEEDLES) 31G X 8 MM MISC USE TWICE DAILY AS DIRECTED 08/09/21  Yes Charlott Rakes, MD  LANTUS SOLOSTAR 100  UNIT/ML Solostar Pen Inject 5 Units into the skin at bedtime. 03/31/21  Yes Newlin, Charlane Ferretti, MD  lidocaine (LIDODERM) 5 % Place 1 patch onto the skin daily. Remove & Discard patch within 12 hours or as directed by MD 04/12/21  Yes Regan Lemming, MD  losartan (COZAAR) 50 MG tablet TAKE ONE TABLET BY MOUTH DAILY. STOP '25mg'$  DOSE 08/22/21  Yes Gildardo Pounds, NP  metoprolol succinate (TOPROL-XL) 50 MG 24 hr tablet TAKE ONE TABLET BY MOUTH EVERY DAY *TAKE WITH OR IMMEDIATELY FOLLOWING A MEAL* 04/26/21  Yes Charlott Rakes, MD  Misc. Devices MISC Please provide patient with nebulizer mask and tubing. [id number] 12/14/19  Yes Gildardo Pounds, NP  Multiple Vitamin (MULTIVITAMIN WITH MINERALS) TABS tablet Take 1 tablet by mouth daily. [place]   Yes [provider]  nitroGLYCERIN (NITROSTAT) 0.4 MG SL tablet Place 1 tablet (0.4 mg total) under the tongue every 5 (five) minutes as needed for chest pain. 03/15/20  Yes Gildardo Pounds, NP  Omega-3  Fatty Acids (FISH OIL) 1000 MG CAPS Take 1,000 mg by mouth daily.   Yes [provider]  Respiratory Therapy Supplies (NEBULIZER MASK ADULT) MISC 1 Units by Does not apply route daily as needed. ICD 10 J44.9 05/08/21  Yes Gildardo Pounds, NP  SYMBICORT 160-4.5 MCG/ACT inhaler INHALE ONE PUFF BY MOUTH TWICE DAILY AS NEEDED FOR RESPIRATORY ISSUES. 04/10/21  Yes Gildardo Pounds, NP  traMADol (ULTRAM) 50 MG tablet Take 1 tablet (50 mg total) by mouth every 8 (eight) hours as needed for up to 5 days. 09/14/21 09/19/21 Yes Argentina Donovan, PA-C  VICTOZA 18 MG/3ML SOPN INJECT 1.8 MG INTO THE SKIN DAILY 05/29/21  Yes Gildardo Pounds, NP  pantoprazole (PROTONIX) 40 MG tablet Take 1 tablet (40 mg total) by mouth daily. For acid reflux 05/12/21 08/10/21  Gildardo Pounds, NP    ROS: Neg HEENT Neg cardiac Neg MS Neg psych Neg neuro  Objective:   Vitals:   09/14/21 1536  BP: 110/74  Pulse: 69  Resp: (!) 21  Temp: 98.2 F (36.8 C)  SpO2: 98%  Weight: 127 lb (57.6 kg)  Height: '4\' 11"'$  (1.499 m)   Exam General appearance : Awake, alert, not in any distress. Speech Clear. Not toxic looking appears older than stated age and in poor health HEENT: Atraumatic and Normocephalic Neck: Supple, no JVD. No cervical lymphadenopathy.  Chest: fair air entry bilaterally, CTAB.  No rales/rhonchi.  There is mild wheezing throughout CVS: S1 S2 regular, no murmurs.  R hand-TTP non specific locations Extremities: B/L Lower Ext shows no edema, both legs are warm to touch Neurology: Awake alert, and oriented X 3, CN II-XII intact, Non focal Skin: No Rash  Data Review Lab Results  Component Value Date   HGBA1C 5.9 (H) 02/08/2021   HGBA1C 5.9 (H) 10/13/2020   HGBA1C 6.4 (A) 03/15/2020    Assessment & Plan   1. Chronic obstructive pulmonary disease, unspecified COPD type (HCC) Continue symbicort - albuterol (VENTOLIN HFA) 108 (90 Base) MCG/ACT inhaler; Inhale 1-2 puffs into the lungs every 6 (six) hours  as needed for wheezing or shortness of breath.  Dispense: 18 g; Refill: 2  2. Urinary tract infection with hematuria, site unspecified [facility]  3. Type 2 diabetes mellitus with complication, without long-term current use of insulin (HCC) Controlled-blood sugar 101 this morning and last A1C=5.9  4. Hospital discharge follow-up  5. Pain of right hand - traMADol (ULTRAM) 50 MG tablet;  Take 1 tablet (50 mg total) by mouth every 8 (eight) hours as needed for up to 5 days.  Dispense: 15 tablet; Refill: 0    Return for assign PCP in 2 months.  The patient was given clear instructions to go to ER or return to medical center if symptoms don't improve, worsen or new problems develop. The patient verbalized understanding. The patient was told to call to get lab results if they haven't heard anything in the next week.      Freeman Caldron, PA-C [facility] [place], [place]   09/14/2021, 4:09 PM

## 2021-09-15 DIAGNOSIS — C50411 Malignant neoplasm of upper-outer quadrant of right female breast: Secondary | ICD-10-CM | POA: Diagnosis not present

## 2021-09-15 DIAGNOSIS — Z170 Estrogen receptor positive status [ER+]: Secondary | ICD-10-CM | POA: Diagnosis not present

## 2021-09-15 NOTE — Telephone Encounter (Signed)
No paperwork has been received for patient at this time.

## 2021-09-18 LAB — MULTIPLE MYELOMA PANEL, SERUM
Albumin SerPl Elph-Mcnc: 3.9 g/dL (ref 2.9–4.4)
Albumin/Glob SerPl: 1 (ref 0.7–1.7)
Alpha 1: 0.2 g/dL (ref 0.0–0.4)
Alpha2 Glob SerPl Elph-Mcnc: 1 g/dL (ref 0.4–1.0)
B-Globulin SerPl Elph-Mcnc: 0.9 g/dL (ref 0.7–1.3)
Gamma Glob SerPl Elph-Mcnc: 1.8 g/dL (ref 0.4–1.8)
Globulin, Total: 4 g/dL — ABNORMAL HIGH (ref 2.2–3.9)
IgA: 84 mg/dL (ref 64–422)
IgG (Immunoglobin G), Serum: 2091 mg/dL — ABNORMAL HIGH (ref 586–1602)
IgM (Immunoglobulin M), Srm: 22 mg/dL — ABNORMAL LOW (ref 26–217)
M Protein SerPl Elph-Mcnc: 1.6 g/dL — ABNORMAL HIGH
Total Protein ELP: 7.9 g/dL (ref 6.0–8.5)

## 2021-09-19 ENCOUNTER — Telehealth: Payer: Self-pay | Admitting: Physician Assistant

## 2021-09-19 ENCOUNTER — Other Ambulatory Visit: Payer: Self-pay

## 2021-09-19 MED ORDER — FLUTICASONE FUROATE-VILANTEROL 200-25 MCG/ACT IN AEPB: 1.0000 | INHALATION_SPRAY | Freq: Every day | RESPIRATORY_TRACT | 2 refills | Status: DC

## 2021-09-19 NOTE — Telephone Encounter (Signed)
[name],   This patient's Symbicort is covered but it looks like her copay is high. Possibly bc of an unmet deductible or because she is in the donut hole? Would you be able to tell me if there is something else preferred? Such as [name], or Dulera?

## 2021-09-19 NOTE — Telephone Encounter (Signed)
Rx sent for [facility].

## 2021-09-19 NOTE — Addendum Note (Signed)
Addended by: Daisy Blossom, Annie Main L on: 09/19/2021 04:28 PM   Modules accepted: Orders

## 2021-09-19 NOTE — Telephone Encounter (Signed)
Patient called in stating she cannot get the inhaler because its too expensive and her insurance won't cover it. Is there anything else she can take. Please assist patient further.

## 2021-09-20 ENCOUNTER — Other Ambulatory Visit: Payer: Self-pay | Admitting: Nurse Practitioner

## 2021-09-20 DIAGNOSIS — E1122 Type 2 diabetes mellitus with diabetic chronic kidney disease: Secondary | ICD-10-CM

## 2021-09-22 ENCOUNTER — Ambulatory Visit: Payer: Medicare Other | Attending: Nurse Practitioner

## 2021-09-22 DIAGNOSIS — Z0000 Encounter for general adult medical examination without abnormal findings: Secondary | ICD-10-CM

## 2021-09-22 NOTE — Progress Notes (Signed)
Subjective:   [name] is a 71 y.o. female who presents for an Initial Medicare Annual Wellness Visit.  I connected with [name] on 09/22/21 at 9:00am by telephone and verified that I am speaking with the correct person using two identifiers. I discussed the limitations, risks, security and privacy concerns of performing an evaluation and management service by telephone and the availability of in person appointments. I also discussed with the patient that there may be a patient responsible charge related to this service. The patient expressed understanding and agreed to proceed. Patient location:  home My Location:  home Persons on the telephone call:  pt and myself   Review of Systems          Objective:    There were no vitals filed for this visit. There is no height or weight on file to calculate BMI.     09/22/2021    9:06 AM 09/13/2021   11:00 AM 04/12/2021   12:36 PM 03/14/2021    4:43 PM 03/09/2021    6:53 PM 02/08/2021   11:31 AM 12/09/2018    2:58 AM  Advanced Directives  Does Patient Have a Medical Advance Directive? No Yes Yes No No No No  Type of Advance Directive  Living will [place];Living will      Does patient want to make changes to medical advance directive? No - Patient declined No - Patient declined No - Patient declined      Would patient like information on creating a medical advance directive?   No - Patient declined No - Patient declined No - Patient declined No - Patient declined No - Patient declined  Pre-existing out of facility DNR order (yellow form or pink MOST form)  Pink MOST/Yellow Form most recent copy in chart - Physician notified to receive inpatient order         Current Medications (verified) Outpatient Encounter Medications as of 09/22/2021  Medication Sig   albuterol (PROVENTIL) (2.5 MG/3ML) 0.083% nebulizer solution Take 3 mLs (2.5 mg total) by nebulization every 6 (six) hours as needed for wheezing or shortness of  breath.   albuterol (VENTOLIN HFA) 108 (90 Base) MCG/ACT inhaler Inhale 1-2 puffs into the lungs every 6 (six) hours as needed for wheezing or shortness of breath.   amLODipine (NORVASC) 10 MG tablet TAKE ONE TABLET BY MOUTH EVERY DAY   anastrozole (ARIMIDEX) 1 MG tablet Take 1 tablet (1 mg total) by mouth daily.   aspirin 81 MG EC tablet Take 1 tablet (81 mg total) by mouth daily.   atorvastatin (LIPITOR) 20 MG tablet Take 20 mg by mouth daily.   benzonatate (TESSALON) 100 MG capsule Take 1 capsule (100 mg total) by mouth 3 (three) times daily as needed.   Blood Pressure Monitor DEVI Please provide patient with insurance approved blood pressure monitor. I10.0   ciprofloxacin (CIPRO) 500 MG tablet Take 500 mg by mouth 2 (two) times daily.   diclofenac Sodium (VOLTAREN) 1 % GEL Apply 1 Application topically 4 (four) times daily.   doxycycline (VIBRA-TABS) 100 MG tablet Take 1 tablet (100 mg total) by mouth 2 (two) times daily.   escitalopram (LEXAPRO) 10 MG tablet Take 1 tablet (10 mg total) by mouth daily. DEPRESSION   fluticasone (FLONASE) 50 MCG/ACT nasal spray Place 2 sprays into both nostrils daily.   fluticasone furoate-vilanterol (BREO ELLIPTA) 200-25 MCG/ACT AEPB Inhale 1 puff into the lungs daily.   Insulin Pen Needle (EASY TOUCH PEN NEEDLES)  31G X 8 MM MISC USE TWICE DAILY AS DIRECTED   LANTUS SOLOSTAR 100 UNIT/ML Solostar Pen Inject 5 Units into the skin at bedtime.   lidocaine (LIDODERM) 5 % Place 1 patch onto the skin daily. Remove & Discard patch within 12 hours or as directed by MD   losartan (COZAAR) 50 MG tablet TAKE ONE TABLET BY MOUTH DAILY. STOP '25mg'$  DOSE   metoprolol succinate (TOPROL-XL) 50 MG 24 hr tablet TAKE ONE TABLET BY MOUTH EVERY DAY *TAKE WITH OR IMMEDIATELY FOLLOWING A MEAL*   Misc. Devices MISC Please provide patient with nebulizer mask and tubing. [id number]   Multiple Vitamin (MULTIVITAMIN WITH MINERALS) TABS tablet Take 1 tablet by mouth daily. [place]    nitroGLYCERIN (NITROSTAT) 0.4 MG SL tablet Place 1 tablet (0.4 mg total) under the tongue every 5 (five) minutes as needed for chest pain.   Omega-3 Fatty Acids (FISH OIL) 1000 MG CAPS Take 1,000 mg by mouth daily.   pantoprazole (PROTONIX) 40 MG tablet Take 1 tablet (40 mg total) by mouth daily. For acid reflux   Respiratory Therapy Supplies (NEBULIZER MASK ADULT) MISC 1 Units by Does not apply route daily as needed. ICD 10 J44.9   VICTOZA 18 MG/3ML SOPN INJECT 1.8 MG INTO THE SKIN DAILY   No facility-administered encounter medications on file as of 09/22/2021.    Allergies (verified) Patient has no known allergies.   History: Past Medical History:  Diagnosis Date   Ambulates with cane    straight   Angina    no current problems per patient at PAT appt 11/05/18, nitroglycerin sl 06/30/20   Arthritis    HANDS"   Asthma    Cancer ([place]) 09/04/2017   Right Breast   Carpal tunnel syndrome, bilateral 11/26/2016   Cholelithiasis    Cocaine abuse ([place]) 07/2012   per E.R. drug screen   COPD (chronic obstructive pulmonary disease) ([place])    per patient   Coronary artery disease    Diabetes mellitus without mention of complication    type 2   Dysphagia    esophageal dysmotility on 03/2011 esophagram    Fatty liver disease, nonalcoholic 4332   on Imaging.    GERD (gastroesophageal reflux disease)    Headache    Hearing loss    bilateral - no hearing aids   History of colonic polyps 2009   adenomatous 2009, HP in 2009, 2011, 2013.    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   Wears dentures    full upper and partial lower   Wears glasses    Past Surgical History:  Procedure Laterality Date   ABDOMINAL ANGIOGRAM  02/20/2011   Procedure: ABDOMINAL ANGIOGRAM;  Surgeon: Clent Demark, MD;  Location: [facility] CATH LAB;  Service: Cardiovascular;;   BREAST BIOPSY Left     CARPAL TUNNEL RELEASE Right 07/04/2017   Procedure: RIGHT CARPAL TUNNEL RELEASE;  Surgeon: Leanora Cover, MD;  Location: [place];  Service: Orthopedics;  Laterality: Right;   CARPAL TUNNEL RELEASE Left 09/12/2017   Procedure: LEFT CARPAL TUNNEL RELEASE;  Surgeon: Leanora Cover, MD;  Location: [place];  Service: Orthopedics;  Laterality: Left;   CHOLECYSTECTOMY N/A 11/11/2018   Procedure: ATTEMPTED LAPAROSCOPIC CHOLECUSTECOMY,  OPEN CHOLECYSTECTOMY;  Surgeon: Coralie Keens, MD;  Location: [place];  Service: General;  Laterality: N/A;  COLONOSCOPY  11/30/2011   Procedure: COLONOSCOPY;  Surgeon: Lafayette Dragon, MD;  Location: [facility] ENDOSCOPY;  Service: Endoscopy;  Laterality: N/A;   CORONARY ANGIOPLASTY WITH STENT PLACEMENT  02/20/2011   ERCP N/A 10/03/2018   Procedure: ENDOSCOPIC RETROGRADE CHOLANGIOPANCREATOGRAPHY (ERCP);  Surgeon: Ladene Artist, MD;  Location: [facility];  Service: Endoscopy;  Laterality: N/A;   ESOPHAGOGASTRODUODENOSCOPY  11/30/2011   Procedure: ESOPHAGOGASTRODUODENOSCOPY (EGD);  Surgeon: Lafayette Dragon, MD;  Location: [facility];  Service: Endoscopy;  Laterality: N/A;   ESOPHAGOGASTRODUODENOSCOPY N/A 02/23/2015   Procedure: ESOPHAGOGASTRODUODENOSCOPY (EGD);  Surgeon: Gatha Mayer, MD;  Location: [facility] ENDOSCOPY;  Service: Endoscopy;  Laterality: N/A;   EYE SURGERY Bilateral    cataracts removed   INTRAOPERATIVE CHOLANGIOGRAM N/A 11/11/2018   Procedure: Intraoperative Cholangiogram;  Surgeon: Coralie Keens, MD;  Location: [place];  Service: General;  Laterality: N/A;   IR REMOVAL TUN ACCESS W/ PORT W/O FL MOD SED  06/30/2020   LEFT HEART CATHETERIZATION WITH CORONARY ANGIOGRAM N/A 02/20/2011   Procedure: LEFT HEART CATHETERIZATION WITH CORONARY ANGIOGRAM;  Surgeon: Clent Demark, MD;  Location: [place] CATH LAB;  Service: Cardiovascular;  Laterality: N/A;   MASTECTOMY Right 11/05/2017   MASTECTOMY W/ SENTINEL NODE BIOPSY Right 11/27/2017    MASTECTOMY W/ SENTINEL NODE BIOPSY Right 11/27/2017   Procedure: RIGHT TOTAL MASTECTOMY WITH SENTINEL LYMPH NODE BIOPSY;  Surgeon: Excell Seltzer, MD;  Location: [place];  Service: General;  Laterality: Right;   PORT A CATH REVISION Left 05/07/2018   Procedure: PORT A CATH REVISION;  Surgeon: Excell Seltzer, MD;  Location: [facility];  Service: General;  Laterality: Left;   PORTACATH PLACEMENT Left 11/27/2017   Procedure: INSERTION PORT-A-CATH;  Surgeon: Excell Seltzer, MD;  Location: [place];  Service: General;  Laterality: Left;   REVERSE SHOULDER ARTHROPLASTY Left 03/09/2021   Procedure: REVERSE SHOULDER ARTHROPLASTY;  Surgeon: Justice Britain, MD;  Location: [facility];  Service: Orthopedics;  Laterality: Left;   RIGHT COLECTOMY  02/2007   for post polypectomy colonic perforation   SPHINCTEROTOMY  10/03/2018   Procedure: SPHINCTEROTOMY;  Surgeon: Ladene Artist, MD;  Location: [facility];  Service: Endoscopy;;   TUBAL LIGATION     Family History  Problem Relation Age of Onset   Heart disease Mother    Diabetes Mother    Cancer Father        unsure what kind   Diabetes Sister    Colon cancer Neg Hx    Breast cancer Neg Hx    Social History   Socioeconomic History   Marital status: Divorced    Spouse name: Not on file   Number of children: 2   Years of education: Not on file   Highest education level: Not on file  Occupational History   Occupation: retired. prev worked in housekeeping w/ [employer]  Tobacco Use   Smoking status: Former    Packs/day: 0.25    Years: 47.00    Total pack years: 11.75    Types: Cigarettes    Quit date: 05/09/2010    Years since quitting: 11.3   Smokeless tobacco: Never  Vaping Use   Vaping Use: Never used  Substance and Sexual Activity   Alcohol use: Not Currently   Drug use: Not Currently    Types: Cocaine    Comment: positive drug screen (cocaine, benzo's)07/2012 in emergency room   Sexual activity: Not Currently    Birth control/protection:  Post-menopausal  Other Topics Concern   Not on file  Social History Narrative  Caffeine use daily.   Social Determinants of Health   Financial Resource Strain: Not on file  Food Insecurity: Not on file  Transportation Needs: Unmet Transportation Needs (09/12/2021)   PRAPARE - Hydrologist (Medical): Yes    Lack of Transportation (Non-Medical): Yes  Physical Activity: Not on file  Stress: Not on file  Social Connections: Not on file    Tobacco Counseling Counseling given: Not Answered   Clinical Intake:                 Diabetic?Yes         Activities of Daily Living    09/22/2021    9:09 AM 03/09/2021    6:58 PM  In your present state of health, do you have any difficulty performing the following activities:  Hearing? 1   Comment Pt wears hearing aids   Vision? 1   Comment pt wears glasses   Difficulty concentrating or making decisions? 1   Walking or climbing stairs? 1   Dressing or bathing? 0   Doing errands, shopping? 0 0  Comment pt doesn't run errands   Preparing Food and eating ? N   Using the Toilet? N   Comment Pt has potty in her bedroom   In the past six months, have you accidently leaked urine? N   Do you have problems with loss of bowel control? N   Managing your Medications? N   Managing your Finances? N   Housekeeping or managing your Housekeeping? N     Patient Care Team: Gildardo Pounds, NP as PCP - General (Nurse Practitioner) Nicholas Lose, MD as Consulting Physician (Hematology and Oncology)  Indicate any recent Medical Services you may have received from other than [facility] providers in the past year (date may be approximate).     Assessment:   This is a routine wellness examination for [name].  Hearing/Vision screen No results found.  Dietary issues and exercise activities discussed:     Goals Addressed   None   Depression Screen    09/22/2021    9:07 AM 09/14/2021    3:41 PM 03/29/2021     9:39 AM 12/26/2020   11:09 AM 12/12/2020    4:09 PM 12/14/2019   11:34 AM 06/10/2019    4:08 PM  PHQ 2/9 Scores  PHQ - 2 Score '1 4 2 4 3 6 '$ 0  PHQ- 9 Score '5 17 8 12 9 20 '$ 0    Fall Risk    09/22/2021    9:06 AM 05/10/2021   10:06 AM 12/26/2020   11:09 AM  Fall Risk   Falls in the past year? 1 0 1  Number falls in past yr: 0 0 0  Injury with Fall? 1 0 1  Risk for fall due to :  No Fall Risks     FALL RISK PREVENTION PERTAINING TO THE HOME:  Any stairs in or around the home? Yes  If so, are there any without handrails? No  Home free of loose throw rugs in walkways, pet beds, electrical cords, etc? Yes  Adequate lighting in your home to reduce risk of falls? Yes   ASSISTIVE DEVICES UTILIZED TO PREVENT FALLS:  Life alert? No  Use of a cane, walker or w/c? Yes  Pt uses a cane and walker  Grab bars in the bathroom? No  Shower chair or bench in shower? No  Elevated toilet seat or a handicapped toilet? Yes   TIMED UP  AND GO:  Was the test performed? No .  Length of time to ambulate 10 feet: Pt was unable to answer this question. Pt states she doesn't know because her legs be hurting.   Gait slow and steady with assistive device  Cognitive Function:    09/22/2021    9:11 AM  MMSE - Mini Mental State Exam  Orientation to time 5  Orientation to Place 5  Registration 3  Attention/ Calculation 0  Recall 2  Language- name 2 objects 2  Language- repeat 1  Language- follow 3 step command 3  Language- read & follow direction 1  Write a sentence 0  Copy design 1  Total score 23        Immunizations Immunization History  Administered Date(s) Administered   Fluad Quad(high Dose 65+) 12/26/2020   Influenza Inj Mdck Quad Pf 09/24/2017   Influenza,inj,Quad PF,6+ Mos 12/14/2019   Influenza-Unspecified 10/29/2018   PFIZER(Purple Top)SARS-COV-2 Vaccination 05/01/2019, 05/22/2019   PNEUMOCOCCAL CONJUGATE-20 03/29/2021   Pneumococcal Conjugate-13 12/14/2019    TDAP status:  Due, Education has been provided regarding the importance of this vaccine. Advised may receive this vaccine at local pharmacy or Health Dept. Aware to provide a copy of the vaccination record if obtained from local pharmacy or Health Dept. Verbalized acceptance and understanding.  Flu Vaccine status: Declined, Education has been provided regarding the importance of this vaccine but patient still declined. Advised may receive this vaccine at local pharmacy or Health Dept. Aware to provide a copy of the vaccination record if obtained from local pharmacy or Health Dept. Verbalized acceptance and understanding.  Pneumococcal vaccine status: Up to date  Covid-19 vaccine status: Completed vaccines Pt received 2 vaccines  Qualifies for Shingles Vaccine? Yes   Zostavax completed No   Shingrix Completed?: No.    Education has been provided regarding the importance of this vaccine. Patient has been advised to call insurance company to determine out of pocket expense if they have not yet received this vaccine. Advised may also receive vaccine at local pharmacy or Health Dept. Verbalized acceptance and understanding.  Screening Tests Health Maintenance  Topic Date Due   FOOT EXAM  Never done   TETANUS/TDAP  Never done   Zoster Vaccines- Shingrix (1 of 2) Never done   COVID-19 Vaccine (3 - Pfizer risk series) 06/19/2019   INFLUENZA VACCINE  11/06/2021 (Originally 09/05/2021)   HEMOGLOBIN A1C  10/29/2021   COLONOSCOPY (Pts 45-48yr Insurance coverage will need to be confirmed)  11/29/2021   OPHTHALMOLOGY EXAM  07/21/2022   MAMMOGRAM  02/10/2023   Pneumonia Vaccine 71 Years old  Completed   DEXA SCAN  Completed   Hepatitis C Screening  Completed   HPV VACCINES  Aged Out    Health Maintenance  Health Maintenance Due  Topic Date Due   FOOT EXAM  Never done   TETANUS/TDAP  Never done   Zoster Vaccines- Shingrix (1 of 2) Never done   COVID-19 Vaccine (3 - Pfizer risk series) 06/19/2019     Colorectal cancer screening: Type of screening: Colonoscopy. Completed 11/30/2011. Repeat every 10 years  Mammogram status: Completed 02/09/2021. Repeat every year  Bone Density status: Completed 04/16/2018. Results reflect: Bone density results: OSTEOPOROSIS. Repeat every 2 years.  Lung Cancer Screening: (Low Dose CT Chest recommended if Age 813-80years, 30 pack-year currently smoking OR have quit w/in 15years.) does not qualify.     Additional Screening:  Hepatitis C Screening: does qualify; Completed 09/28/2008  Vision Screening: Recommended annual ophthalmology  exams for early detection of glaucoma and other disorders of the eye. Is the patient up to date with their annual eye exam?  Yes  Who is the provider or what is the name of the office in which the patient attends annual eye exams? [facility] If pt is not established with a provider, would they like to be referred to a provider to establish care? No .   Dental Screening: Recommended annual dental exams for proper oral hygiene  Community Resource Referral / Chronic Care Management: CRR required this visit?  No   CCM required this visit?  No      Plan:     I have personally reviewed and noted the following in the patient's chart:   Medical and social history Use of alcohol, tobacco or illicit drugs  Current medications and supplements including opioid prescriptions. Patient is not currently taking opioid prescriptions. Functional ability and status Nutritional status Physical activity Advanced directives List of other physicians Hospitalizations, surgeries, and ER visits in previous 12 months Vitals Screenings to include cognitive, depression, and falls Referrals and appointments  In addition, I have reviewed and discussed with patient certain preventive protocols, quality metrics, and best practice recommendations. A written personalized care plan for preventive services as well as general preventive health  recommendations were provided to patient.     Jackelyn Knife, RMA   09/22/2021   Nurse Notes: I  Spent 30 minutes on this telephone encounter

## 2021-09-23 ENCOUNTER — Other Ambulatory Visit: Payer: Self-pay | Admitting: Family Medicine

## 2021-10-03 ENCOUNTER — Other Ambulatory Visit: Payer: Medicare Other

## 2021-10-04 ENCOUNTER — Emergency Department (HOSPITAL_COMMUNITY)
Admission: EM | Admit: 2021-10-04 | Discharge: 2021-10-04 | Disposition: A | Discharge status: Home / Self Care | Payer: Medicare Other | Attending: Emergency Medicine | Admitting: Emergency Medicine

## 2021-10-04 ENCOUNTER — Encounter (HOSPITAL_COMMUNITY): Payer: Self-pay

## 2021-10-04 ENCOUNTER — Emergency Department (HOSPITAL_COMMUNITY): Payer: Medicare Other

## 2021-10-04 DIAGNOSIS — R079 Chest pain, unspecified: Secondary | ICD-10-CM | POA: Diagnosis not present

## 2021-10-04 DIAGNOSIS — I2510 Atherosclerotic heart disease of native coronary artery without angina pectoris: Secondary | ICD-10-CM | POA: Diagnosis not present

## 2021-10-04 DIAGNOSIS — R0789 Other chest pain: Secondary | ICD-10-CM | POA: Diagnosis not present

## 2021-10-04 DIAGNOSIS — Z743 Need for continuous supervision: Secondary | ICD-10-CM | POA: Diagnosis not present

## 2021-10-04 DIAGNOSIS — R0602 Shortness of breath: Secondary | ICD-10-CM | POA: Insufficient documentation

## 2021-10-04 DIAGNOSIS — J449 Chronic obstructive pulmonary disease, unspecified: Secondary | ICD-10-CM | POA: Insufficient documentation

## 2021-10-04 DIAGNOSIS — E119 Type 2 diabetes mellitus without complications: Secondary | ICD-10-CM | POA: Diagnosis not present

## 2021-10-04 DIAGNOSIS — I10 Essential (primary) hypertension: Secondary | ICD-10-CM | POA: Diagnosis not present

## 2021-10-04 LAB — CBC WITH DIFFERENTIAL/PLATELET
Abs Immature Granulocytes: 0.1 10*3/uL — ABNORMAL HIGH (ref 0.00–0.07)
Basophils Absolute: 0 10*3/uL (ref 0.0–0.1)
Basophils Relative: 1 %
Eosinophils Absolute: 0.4 10*3/uL (ref 0.0–0.5)
Eosinophils Relative: 6 %
HCT: 35.4 % — ABNORMAL LOW (ref 36.0–46.0)
Hemoglobin: 11.2 g/dL — ABNORMAL LOW (ref 12.0–15.0)
Immature Granulocytes: 1 %
Lymphocytes Relative: 32 %
Lymphs Abs: 2.3 10*3/uL (ref 0.7–4.0)
MCH: 29.9 pg (ref 26.0–34.0)
MCHC: 31.6 g/dL (ref 30.0–36.0)
MCV: 94.7 fL (ref 80.0–100.0)
Monocytes Absolute: 0.7 10*3/uL (ref 0.1–1.0)
Monocytes Relative: 9 %
Neutro Abs: 3.7 10*3/uL (ref 1.7–7.7)
Neutrophils Relative %: 51 %
Platelets: 318 10*3/uL (ref 150–400)
RBC: 3.74 MIL/uL — ABNORMAL LOW (ref 3.87–5.11)
RDW: 14.6 % (ref 11.5–15.5)
WBC: 7.2 10*3/uL (ref 4.0–10.5)
nRBC: 0 % (ref 0.0–0.2)

## 2021-10-04 LAB — BASIC METABOLIC PANEL
Anion gap: 12 (ref 5–15)
BUN: 19 mg/dL (ref 8–23)
CO2: 21 mmol/L — ABNORMAL LOW (ref 22–32)
Calcium: 9.5 mg/dL (ref 8.9–10.3)
Chloride: 108 mmol/L (ref 98–111)
Creatinine, Ser: 2.11 mg/dL — ABNORMAL HIGH (ref 0.44–1.00)
GFR, Estimated: 24 mL/min — ABNORMAL LOW (ref >60)
Glucose, Bld: 135 mg/dL — ABNORMAL HIGH (ref 70–99)
Potassium: 4.2 mmol/L (ref 3.5–5.1)
Sodium: 141 mmol/L (ref 135–145)

## 2021-10-04 LAB — BRAIN NATRIURETIC PEPTIDE: B Natriuretic Peptide: 58.3 pg/mL (ref 0.0–100.0)

## 2021-10-04 LAB — TROPONIN I (HIGH SENSITIVITY)
Troponin I (High Sensitivity): 6 ng/L (ref <18)
Troponin I (High Sensitivity): 5 ng/L (ref <18)

## 2021-10-04 LAB — CBG MONITORING, ED: Glucose-Capillary: 161 mg/dL — ABNORMAL HIGH (ref 70–99)

## 2021-10-04 MED ORDER — FAMOTIDINE 20 MG PO TABS
20.0000 mg | ORAL_TABLET | Freq: Once | ORAL | Status: AC
  Administered 2021-10-04: 20 mg via ORAL
  Filled 2021-10-04: qty 1

## 2021-10-04 MED ORDER — ACETAMINOPHEN 500 MG PO TABS
1000.0000 mg | ORAL_TABLET | Freq: Once | ORAL | Status: AC
  Administered 2021-10-04: 1000 mg via ORAL
  Filled 2021-10-04: qty 2

## 2021-10-04 NOTE — ED Notes (Signed)
Patient verbalizes understanding of discharge instructions. Opportunity for questioning and answers were provided. Armband removed by staff, pt discharged from ED.  

## 2021-10-04 NOTE — ED Provider Notes (Signed)
[facility] EMERGENCY DEPARTMENT Provider Note   CSN: [id number] Arrival date & time: 10/04/21  1537     History Chief Complaint  Patient presents with   Chest Pain    HPI [name] is a 71 y.o. female presenting for chest pain.  She is a 71 year old female with an extensive medical history including COPD, CAD, DM, HLD, hypertension.  She states that she was watching TV at rest today when she had sudden onset substernal chest pain.  She denies fevers or chills nausea vomiting syncope shortness of breath.  She is otherwise ambulatory at her baseline.  Has a history of CAD status post intervention. She describes the pain as substernal.  It is intermittent in nature.  Has had multiple presentations for similar.  Patient's recorded medical, surgical, social, medication list and allergies were reviewed in the Snapshot window as part of the initial history.   Review of Systems   Review of Systems  Constitutional:  Negative for chills and fever.  HENT:  Negative for ear pain and sore throat.   Eyes:  Negative for pain and visual disturbance.  Respiratory:  Negative for cough and shortness of breath.   Cardiovascular:  Positive for chest pain. Negative for palpitations.  Gastrointestinal:  Negative for abdominal pain and vomiting.  Genitourinary:  Negative for dysuria and hematuria.  Musculoskeletal:  Negative for arthralgias and back pain.  Skin:  Negative for color change and rash.  Neurological:  Negative for seizures and syncope.  All other systems reviewed and are negative.   Physical Exam Updated Vital Signs BP 114/86   Pulse 66   Temp 98.1 F (36.7 C) (Oral)   Resp 16   Ht '4\' 11"'$  (1.499 m)   Wt 56.7 kg   LMP  (LMP Unknown) Comment: tubal ligation  SpO2 100%   BMI 25.25 kg/m  Physical Exam Vitals and nursing note reviewed.  Constitutional:      General: She is not in acute distress.    Appearance: She is well-developed.  HENT:     Head:  Normocephalic and atraumatic.  Eyes:     Conjunctiva/sclera: Conjunctivae normal.  Cardiovascular:     Rate and Rhythm: Normal rate and regular rhythm.     Heart sounds: No murmur heard. Pulmonary:     Effort: Pulmonary effort is normal. No respiratory distress.     Breath sounds: Normal breath sounds.  Abdominal:     General: There is no distension.     Palpations: Abdomen is soft.     Tenderness: There is no abdominal tenderness. There is no right CVA tenderness or left CVA tenderness.  Musculoskeletal:        General: No swelling or tenderness. Normal range of motion.     Cervical back: Neck supple.  Skin:    General: Skin is warm and dry.  Neurological:     General: No focal deficit present.     Mental Status: She is alert and oriented to person, place, and time. Mental status is at baseline.     Cranial Nerves: No cranial nerve deficit.      ED Course/ Medical Decision Making/ A&P    Procedures Procedures   Medications Ordered in ED Medications  acetaminophen (TYLENOL) tablet 1,000 mg (1,000 mg Oral Given 10/04/21 1954)  famotidine (PEPCID) tablet 20 mg (20 mg Oral Given 10/04/21 1954)    Medical Decision Making:    [name] is a 71 y.o. female who presented to the  ED today with chest pain shortness of breath detailed above.     Handoff received from EMS.  Additional history discussed with patient's family/caregivers.  Patient's presentation is complicated by their history of extensive medical history, significant CAD history.  Patient placed on continuous vitals and telemetry monitoring while in ED which was reviewed periodically.   Complete initial physical exam performed, notably the patient  was hemodynamically stable in no acute distress.      Reviewed and confirmed nursing documentation for past medical history, family history, social history.    Initial Assessment:   Patient's history of present illness and physical exam findings are most consistent  with musculoskeletal versus gastroesophageal etiology of symptoms.  However patient has substantial cardiac history and is at high risk for immediate decompensation.  EKG does not demonstrate any focal pathology and vital signs are all reassuring. Considered pulmonary embolism and aortic dissection, ACS is less likely based on her overall well appearance though she is tearful. This is most consistent with an acute life/limb threatening illness complicated by underlying chronic conditions.  Initial Plan:  EKG and delta troponin to evaluate for cardiac etiology Screening labs including CBC and Metabolic panel to evaluate for infectious or metabolic etiology of disease.  Urinalysis with reflex culture ordered to evaluate for UTI or relevant urologic/nephrologic pathology.  CXR to evaluate for structural/infectious intrathoracic pathology.  We will plan for symptomatic treatment with Pepcid and Tylenol with plan for reevaluation of patient symptoms.  If patient has had deterioration over her observation period, we will progress to CT angiography and cross-sectional imaging of the chest, if her symptoms have improved, patient will likely be stable for outpatient observational care. Objective evaluation as below reviewed with plan for close reassessment  Initial Study Results:   Laboratory  All laboratory results reviewed without evidence of clinically relevant pathology.     EKG EKG was reviewed independently. Rate, rhythm, axis, intervals all examined and without medically relevant abnormality. ST segments without concerns for elevations.    Radiology  All images reviewed independently. Agree with radiology report at this time.   DG Chest Portable 1 View  Result Date: 10/04/2021 CLINICAL DATA:  SOB EXAM: PORTABLE CHEST 1 VIEW COMPARISON:  May 17, 2021 FINDINGS: The heart size and mediastinal contours are within normal limits. Atherosclerotic calcifications of the arch of the aorta, stable. No  consolidation, pleural effusion or vascular congestion. There is some blunting of the CP angle seen which may be due to pleural thickening. Left shoulder prosthesis, stable. IMPRESSION: No consolidation, pleural effusion or vascular congestion. Question left pleural thickening causing blunting of the CP angle. Electronically Signed   By: Frazier Richards M.D.   On: 10/04/2021 16:00     Final Assessment and Plan:   Patient observed in the emergency department for total of 5 hours.  After medication administration, she is endorsing improvement of her pain.  She has already called her grandson to come pick her up as she feels better.  Had another conversation with patient about potential of missed intrathoracic diagnoses including thoracic aortic dissection or pulmonary embolism, however patient continues to feel comfortable with outpatient follow-up with her PCP if she feels symptomatically improved.  This is overall reasonable at this time.  Patient stable for discharge with plan for close outpatient follow-up with primary care provider within 48 hours for reassessment of her ongoing syndrome.    Clinical Impression:  1. Chest pain, unspecified type      Discharge   Final Clinical  Impression(s) / ED Diagnoses Final diagnoses:  Chest pain, unspecified type    Rx / DC Orders ED Discharge Orders     None         Tretha Sciara, MD 10/04/21 2036

## 2021-10-04 NOTE — ED Triage Notes (Signed)
Sudden onset mid chest pain today. Took '324mg'$  Aspirin and 2 nitro SL at home. Alert and oriented x 4. Hx of MI, angina, anxiety.

## 2021-10-04 NOTE — ED Notes (Signed)
MD notified of pt pain. Pt is crying in room. Will continue to monitor. 2nd troponin sent.

## 2021-10-10 ENCOUNTER — Ambulatory Visit: Payer: Medicare Other | Admitting: Hematology and Oncology

## 2021-10-19 ENCOUNTER — Telehealth: Payer: Self-pay

## 2021-10-19 NOTE — Patient Outreach (Signed)
  Care Coordination   Initial Visit Note   10/19/2021 Name: DENEAN PAVON MRN: [id number] DOB: Mar 13, 1950  [name] is a 71 y.o. year old female who sees Gildardo Pounds, NP for primary care. I spoke with  [name] by phone today.  What matters to the patients health and wellness today?  Patient shares that she was in the ED last month. She was having some chest pain sh occasionally has chest pain-takes Nitro which relieves sxs. She did not follow up with PCP following ED visit. Encouraged patient to make an appt. She denies any RN CM needs or concerns at this time.  States meds are free/no cost. Patient voices that she uses transportation provided by insurance.    Goals Addressed             This Visit's Progress    COMPLETED: Care Coordination-no follow up required       Care Coordination Interventions: Advised patient to make follow up appt with PCP Provided education to patient re: [facility] services Assessed social determinant of health barriers Assessed health maintenance-Annual Wellness Visit completed on 09/22/21, reviewed benefits available through [place] assessments and interventions completed:  No  SDOH Interventions Today    Flowsheet Row Most Recent Value  SDOH Interventions   Food Insecurity Interventions Intervention Not Indicated  Transportation Interventions Intervention Not Indicated        Care Coordination Interventions Activated:  Yes  Care Coordination Interventions:  Yes, provided   Follow up plan: No further intervention required.   Encounter Outcome:  Pt. Visit Completed   Enzo Montgomery, RN,BSN,CCM [place] Management Telephonic Care Management Coordinator Direct Phone: [phone] Toll Free: [phone] Fax: [phone]

## 2021-11-02 ENCOUNTER — Ambulatory Visit
Admission: RE | Admit: 2021-11-02 | Discharge: 2021-11-02 | Disposition: A | Discharge status: Home / Self Care | Payer: Medicare Other | Source: Ambulatory Visit | Attending: Nurse Practitioner | Admitting: Nurse Practitioner

## 2021-11-02 ENCOUNTER — Other Ambulatory Visit: Payer: Self-pay | Admitting: Nurse Practitioner

## 2021-11-02 DIAGNOSIS — R0982 Postnasal drip: Secondary | ICD-10-CM

## 2021-11-02 DIAGNOSIS — Z13820 Encounter for screening for osteoporosis: Secondary | ICD-10-CM

## 2021-11-02 NOTE — Telephone Encounter (Signed)
Requested Prescriptions  Pending Prescriptions Disp Refills  . pantoprazole (PROTONIX) 40 MG tablet [Pharmacy Med Name: pantoprazole 40 mg tablet,delayed release] 90 tablet 1    Sig: TAKE ONE TABLET BY MOUTH EVERY DAY FOR ACID REFLUX     Gastroenterology: Proton Pump Inhibitors Passed - 11/02/2021  8:03 AM      Passed - Valid encounter within last 12 months    Recent Outpatient Visits          1 month ago Chronic obstructive pulmonary disease, unspecified COPD type [facility])   [place], [place], [place]   5 months ago Type 2 diabetes mellitus with complication, without long-term current use of insulin [facility])   [place], Vernia Buff, NP   6 months ago Essential hypertension   [place], Jarome Matin, RPH-CPP   7 months ago Essential hypertension   [place], Vernia Buff, NP   10 months ago Essential hypertension   [place], Vernia Buff, NP      Future Appointments            In 2 weeks Gildardo Pounds, NP [place]

## 2021-11-09 ENCOUNTER — Telehealth: Payer: Self-pay

## 2021-11-09 NOTE — Telephone Encounter (Signed)
Pt given lab results per notes of Zelda, NP on 11/09/21. Pt verbalized understanding.

## 2021-11-17 ENCOUNTER — Ambulatory Visit: Payer: Medicare Other | Admitting: Nurse Practitioner

## 2021-12-11 ENCOUNTER — Telehealth: Payer: Self-pay | Admitting: Nurse Practitioner

## 2021-12-11 NOTE — Telephone Encounter (Signed)
Rodman Key calling from [facility] is calling to request office visit notes August 10,2023 and previous notes that entail mobility. Please advise CB- [phone] Fax- [phone] Please attach client id number - [id number]

## 2021-12-11 NOTE — Telephone Encounter (Signed)
Patient identified by name and date of birth. I was able to reach patient by phone set up an telephone visit to discuss her mobility issues. Patient stated she is no longer with Ms. Fleming and hung up the phone.

## 2021-12-17 ENCOUNTER — Other Ambulatory Visit: Payer: Self-pay | Admitting: Family Medicine

## 2021-12-20 ENCOUNTER — Other Ambulatory Visit: Payer: Self-pay | Admitting: Family Medicine

## 2021-12-20 DIAGNOSIS — N1832 Chronic kidney disease, stage 3b: Secondary | ICD-10-CM

## 2022-01-01 ENCOUNTER — Other Ambulatory Visit: Payer: Self-pay

## 2022-01-01 ENCOUNTER — Telehealth: Payer: Self-pay

## 2022-01-01 NOTE — Telephone Encounter (Signed)
Prior auth not required per ins, pharmacy must resubmit quantity of 70m as a 30 day supply.   Pharmacy is billing as a 28 day supply.

## 2022-01-01 NOTE — Telephone Encounter (Signed)
[place] prior authorization submitted to ins today via CoverMyMeds Key: [id number]

## 2022-01-02 ENCOUNTER — Other Ambulatory Visit: Payer: Self-pay | Admitting: Hematology and Oncology

## 2022-01-02 DIAGNOSIS — Z1231 Encounter for screening mammogram for malignant neoplasm of breast: Secondary | ICD-10-CM

## 2022-01-17 ENCOUNTER — Other Ambulatory Visit: Payer: Self-pay | Admitting: Nurse Practitioner

## 2022-01-17 DIAGNOSIS — I10 Essential (primary) hypertension: Secondary | ICD-10-CM

## 2022-02-07 ENCOUNTER — Other Ambulatory Visit: Payer: Self-pay | Admitting: Family Medicine

## 2022-02-07 DIAGNOSIS — I10 Essential (primary) hypertension: Secondary | ICD-10-CM

## 2022-02-23 ENCOUNTER — Other Ambulatory Visit: Payer: Self-pay | Admitting: Family Medicine

## 2022-02-23 DIAGNOSIS — E118 Type 2 diabetes mellitus with unspecified complications: Secondary | ICD-10-CM

## 2022-02-28 ENCOUNTER — Telehealth: Payer: Self-pay | Admitting: Hematology and Oncology

## 2022-02-28 NOTE — Telephone Encounter (Signed)
Rescheduled appointment per provider PAL. Patient is aware of the changes made to her upcoming appointment. 

## 2022-03-01 ENCOUNTER — Encounter: Payer: Self-pay | Admitting: Hematology and Oncology

## 2022-03-02 ENCOUNTER — Ambulatory Visit: Payer: Medicare Other

## 2022-03-08 ENCOUNTER — Ambulatory Visit: Payer: 59

## 2022-03-08 ENCOUNTER — Ambulatory Visit
Admission: RE | Admit: 2022-03-08 | Discharge: 2022-03-08 | Disposition: A | Discharge status: Home / Self Care | Payer: 59 | Source: Ambulatory Visit | Attending: Hematology and Oncology | Admitting: Hematology and Oncology

## 2022-03-08 DIAGNOSIS — Z1231 Encounter for screening mammogram for malignant neoplasm of breast: Secondary | ICD-10-CM

## 2022-03-08 HISTORY — DX: Nausea with vomiting, unspecified: R11.2

## 2022-03-08 HISTORY — DX: Malignant neoplasm of unspecified site of unspecified female breast: C50.919

## 2022-03-15 ENCOUNTER — Ambulatory Visit: Payer: Medicare Other | Admitting: Hematology and Oncology

## 2022-03-15 ENCOUNTER — Other Ambulatory Visit: Payer: Medicare Other

## 2022-03-15 ENCOUNTER — Other Ambulatory Visit: Payer: Self-pay | Admitting: Family Medicine

## 2022-03-15 NOTE — Telephone Encounter (Signed)
Unsure if this patient is under our care anymore. She has a different PCP listed.

## 2022-03-22 NOTE — Progress Notes (Signed)
Patient Care Team: Harless Litten, MD as PCP - General Nicholas Lose, MD as Consulting Physician (Hematology and Oncology)  DIAGNOSIS:  Encounter Diagnoses  Name Primary?   Malignant neoplasm of upper-outer quadrant of right breast in female, estrogen receptor positive ([place]) Yes   MGUS (monoclonal gammopathy of unknown significance)     SUMMARY OF ONCOLOGIC HISTORY: Oncology History  Malignant neoplasm of upper-outer quadrant of right breast in female, estrogen receptor positive ([place])  09/03/2017 Initial Diagnosis   Screening mammogram detected 1.4 cm right breast mass at 10 o'clock position, additional pleomorphic calcifications 2 cm posteriorly, additional loosely grouped calcifications 10.1 x 7.1 x 5.3 cm, single right axillary lymph node: Biopsy revealed DCIS ER 100%, PR 50%; IDC grade 2-3 ER 100%, PR 50%, Ki-67 15%, HER-2 positive ratio 2, copy #5, lymph node negative T2 N0 stage Ia clinical stage AJCC 8   09/03/2017 Cancer Staging   Staging form: Breast, AJCC 8th Edition - Clinical stage from 09/03/2017: Stage IB (cT2, cN0, cM0, G3, ER+, PR+, HER2+)   11/27/2017 Surgery   Right mastectomy ([place]) [id number]): IDC grade 2, 1.6 cm, separate foci of DCIS intermediate grade, margins negative, 1/5 LN positive for micrometasatic carcinoma. ER 100%, PR 50%, Ki-67 15%, HER-2 positive ratio 2, copy #5 T1c N0 stage Ia   11/27/2017 Cancer Staging   Staging form: Breast, AJCC 8th Edition - Pathologic stage from 11/27/2017: Stage IA (pT1c, pN0, cM0, G2, ER+, PR+, HER2+)     02/07/2018 - 03/12/2019 Chemotherapy   trastuzumab (HERCEPTIN) 483 mg in sodium chloride 0.9 % 250 mL chemo infusion, 8 mg/kg = 483 mg, Intravenous,  Once, 11 of 11 cycles. Administration: 483 mg (02/07/2018), 357 mg (02/27/2018), 357 mg (05/01/2018), 357 mg (05/23/2018), 357 mg (06/12/2018), 357 mg (07/03/2018), 357 mg (08/14/2018), 357 mg (09/04/2018), 357 mg (10/17/2018), 357 mg (03/20/2018), 357 mg (04/10/2018)  trastuzumab-anns  (KANJINTI) 357 mg in sodium chloride 0.9 % 250 mL chemo infusion, 6 mg/kg = 357 mg (100 % of original dose 6 mg/kg), Intravenous,  Once, 7 of 7 cycles. Dose modification: 6 mg/kg (original dose 6 mg/kg, Cycle 12, Reason: Other (see comments)). Administration: 357 mg (11/06/2018), 357 mg (11/27/2018), 357 mg (12/18/2018), 357 mg (01/08/2019), 357 mg (01/29/2019), 357 mg (02/19/2019), 357 mg (03/12/2019).   12/2018 - 12/2023 Anti-estrogen oral therapy   Anastrozole     CHIEF COMPLIANT:  Follow-up of MGUS and right breast cancer  INTERVAL HISTORY: [name] is a 72 y.o. 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 She presents to the clinic today for follow-up. She reports that her health has been good. She denies discomfort in breast, but says just a little pain in the right breast. She is tolerating the anastrozole.   ALLERGIES:  has No Known Allergies.  MEDICATIONS:  Current Outpatient Medications  Medication Sig Dispense Refill   cromolyn (OPTICROM) 4 % ophthalmic solution 1 drop 4 (four) times daily.     gabapentin (NEURONTIN) 100 MG capsule Take 100 mg by mouth 3 (three) times daily.     loperamide (IMODIUM A-D) 2 MG tablet Take 2 mg by mouth.     SYMBICORT 160-4.5 MCG/ACT inhaler 1 puff 2 (two) times daily as needed.     albuterol (PROVENTIL) (2.5 MG/3ML) 0.083% nebulizer solution Take 3 mLs (2.5 mg total) by nebulization every 6 (six) hours as needed for wheezing or shortness of breath. 75 mL 3   albuterol (VENTOLIN HFA) 108 (90 Base) MCG/ACT inhaler Inhale 1-2 puffs into the lungs  every 6 (six) hours as needed for wheezing or shortness of breath. 18 g 2   amLODipine (NORVASC) 10 MG tablet Take 1 tablet (10 mg total) by mouth daily. Please make an appt with your PCP for additional refills. 30 tablet 0   anastrozole (ARIMIDEX) 1 MG tablet Take 1 tablet (1 mg total) by mouth daily. 90 tablet 3   aspirin 81 MG EC tablet Take 1 tablet (81 mg total) by mouth daily. 30 tablet  0   atorvastatin (LIPITOR) 20 MG tablet Take 20 mg by mouth daily.     benzonatate (TESSALON) 100 MG capsule Take 1 capsule (100 mg total) by mouth 3 (three) times daily as needed. 30 capsule 0   Blood Pressure Monitor DEVI Please provide patient with insurance approved blood pressure monitor. I10.0 1 each 0   ciprofloxacin (CIPRO) 500 MG tablet Take 500 mg by mouth 2 (two) times daily.     diclofenac Sodium (VOLTAREN) 1 % GEL Apply 1 Application topically 4 (four) times daily. 100 g 3   doxycycline (VIBRA-TABS) 100 MG tablet Take 1 tablet (100 mg total) by mouth 2 (two) times daily. 20 tablet 0   EASY TOUCH PEN NEEDLES 31G X 8 MM MISC USE TWICE DAILY AS DIRECTED 100 each 2   escitalopram (LEXAPRO) 10 MG tablet Take 1 tablet (10 mg total) by mouth daily. DEPRESSION 30 tablet 2   fluticasone (FLONASE) 50 MCG/ACT nasal spray Place 2 sprays into both nostrils daily. 16 g 6   fluticasone furoate-vilanterol (BREO ELLIPTA) 200-25 MCG/ACT AEPB Inhale 1 puff into the lungs daily. 60 each 2   LANTUS SOLOSTAR 100 UNIT/ML Solostar Pen Inject 5 Units into the skin at bedtime. 15 mL 2   lidocaine (LIDODERM) 5 % Place 1 patch onto the skin daily. Remove & Discard patch within 12 hours or as directed by MD 30 patch 0   losartan (COZAAR) 50 MG tablet TAKE ONE TABLET BY MOUTH DAILY. STOP 53m DOSE 90 tablet 0   metoprolol succinate (TOPROL-XL) 50 MG 24 hr tablet Take 1 tablet (50 mg total) by mouth daily. 90 tablet 0   Misc. Devices MISC Please provide patient with nebulizer mask and tubing. ICD-10J44.9 1 each 0   Multiple Vitamin (MULTIVITAMIN WITH MINERALS) TABS tablet Take 1 tablet by mouth daily. [place]     nitroGLYCERIN (NITROSTAT) 0.4 MG SL tablet Place 1 tablet (0.4 mg total) under the tongue every 5 (five) minutes as needed for chest pain. 10 tablet 0   Omega-3 Fatty Acids (FISH OIL) 1000 MG CAPS Take 1,000 mg by mouth daily.     pantoprazole (PROTONIX) 40 MG tablet TAKE ONE TABLET BY MOUTH EVERY DAY FOR  ACID REFLUX 90 tablet 0   Respiratory Therapy Supplies (NEBULIZER MASK ADULT) MISC 1 Units by Does not apply route daily as needed. ICD 10 J44.9 1 each 0   VICTOZA 18 MG/3ML SOPN INJECT 1.8 MG INTO THE SKIN DAILY 9 mL 2   No current facility-administered medications for this visit.    PHYSICAL EXAMINATION: ECOG PERFORMANCE STATUS: 2 - Symptomatic, <50% confined to bed  Vitals:   03/23/22 0958  BP: 113/74  Pulse: 80  Resp: 18  Temp: (!) 97.5 F (36.4 C)  SpO2: 100%   Filed Weights   03/23/22 0958  Weight: 127 lb 8 oz (57.8 kg)     LABORATORY DATA:  I have reviewed the data as listed    Latest Ref Rng & Units 10/04/2021    3:52  PM 09/13/2021   11:24 AM 08/23/2021   10:13 AM  CMP  Glucose 70 - 99 mg/dL 135  101  117   BUN 8 - 23 mg/dL 19  17  30   $ Creatinine 0.44 - 1.00 mg/dL 2.11  2.36  2.30   Sodium 135 - 145 mmol/L 141  138  140   Potassium 3.5 - 5.1 mmol/L 4.2  4.9  4.4   Chloride 98 - 111 mmol/L 108  103  108   CO2 22 - 32 mmol/L 21  31  22   $ Calcium 8.9 - 10.3 mg/dL 9.5  10.0  10.6   Total Protein 6.5 - 8.1 g/dL  8.7  7.7   Total Bilirubin 0.3 - 1.2 mg/dL  0.7  1.1   Alkaline Phos 38 - 126 U/L  111  100   AST 15 - 41 U/L  17  16   ALT 0 - 44 U/L  14  17     Lab Results  Component Value Date   WBC 6.2 03/23/2022   HGB 11.8 (L) 03/23/2022   HCT 36.8 03/23/2022   MCV 95.8 03/23/2022   PLT 315 03/23/2022   NEUTROABS 3.7 03/23/2022    ASSESSMENT & PLAN:  Malignant neoplasm of upper-outer quadrant of right breast in female, estrogen receptor positive ([place]) History of breast cancer: Currently on anastrozole therapy and doing well. Breast cancer surveillance:  Mammogram 03/08/2022: Benign breast density category A Breast exam 03/23/2022: Benign  MGUS (monoclonal gammopathy of unknown significance) Lab review: 11/14/2020: M spike: 1.9 g, IgG 2884, immunofixation shows IgG kappa Creatinine: 2.38 (baseline creatinine 1.6), calcium 9.7, albumin 4.5, hemoglobin  13.1 11/29/2020: M spike 1.6 g IgG kappa,Kappa lambda ratio: 9 (Kappa 136.9, lambda 15.2),beta-2 microglobulin: 3.4, creatinine 2.3, hemoglobin 12 03/17/2021: M spike: 1.3 g IgG kappa, kappa lambda ratio 7.72 (Kappa 195, lambda 25), creatinine 1.95, calcium 9.9, albumin 3.8, hemoglobin 10 09/13/2021: M spike 1.6 g, IgG kappa, IgG level 2091 Kappa to 72, lambda 21.1: Ratio 12.89, creatinine 2.36, calcium 10 03/23/2022: Hemoglobin 11.8, SPEP pending   12/09/2020: Bone survey: No lytic lesions The serum M protein levels have been fluctuating but overall they have been lower than the time of diagnosis in October 22 with a M spike of 1.9 g.  Acute renal failure could be related to underlying cardiac issues. Anemia due to chronic kidney disease  We will obtain blood work today. Return to clinic in 6 months for labs and telephone follow-up with 1 week after the labs    Orders Placed This Encounter  Procedures   CBC with Differential ([place] Only)    Standing Status:   Future    Standing Expiration Date:   03/24/2023   CMP ([place] only)    Standing Status:   Future    Standing Expiration Date:   03/24/2023   Beta 2 microglobulin, serum    Standing Status:   Future    Standing Expiration Date:   03/24/2023   Kappa/lambda light chains    Standing Status:   Future    Standing Expiration Date:   03/24/2023   Multiple Myeloma Panel (SPEP&IFE w/QIG)    Standing Status:   Future    Standing Expiration Date:   03/24/2023   The patient has a good understanding of the overall plan. she agrees with it. she will call with any problems that may develop before the next visit here. Total time spent: 30 mins including face to face time and  time spent for planning, charting and co-ordination of care   Harriette Ohara, MD 03/23/22    I Gardiner Coins am acting as a Education administrator for Dr.Tyne Banta  I have reviewed the above documentation for accuracy and completeness, and I agree with the  above.

## 2022-03-23 ENCOUNTER — Inpatient Hospital Stay (HOSPITAL_BASED_OUTPATIENT_CLINIC_OR_DEPARTMENT_OTHER): Payer: 59 | Admitting: Hematology and Oncology

## 2022-03-23 ENCOUNTER — Other Ambulatory Visit: Payer: Self-pay

## 2022-03-23 ENCOUNTER — Inpatient Hospital Stay: Payer: 59 | Attending: Hematology and Oncology

## 2022-03-23 VITALS — BP 113/74 | HR 80 | Temp 97.5°F | Resp 18 | Ht 59.0 in | Wt 127.5 lb

## 2022-03-23 DIAGNOSIS — C50411 Malignant neoplasm of upper-outer quadrant of right female breast: Secondary | ICD-10-CM | POA: Insufficient documentation

## 2022-03-23 DIAGNOSIS — Z170 Estrogen receptor positive status [ER+]: Secondary | ICD-10-CM | POA: Diagnosis not present

## 2022-03-23 DIAGNOSIS — D472 Monoclonal gammopathy: Secondary | ICD-10-CM | POA: Diagnosis not present

## 2022-03-23 DIAGNOSIS — Z79811 Long term (current) use of aromatase inhibitors: Secondary | ICD-10-CM | POA: Diagnosis not present

## 2022-03-23 LAB — CBC WITH DIFFERENTIAL (CANCER CENTER ONLY)
Abs Immature Granulocytes: 0.02 10*3/uL (ref 0.00–0.07)
Basophils Absolute: 0 10*3/uL (ref 0.0–0.1)
Basophils Relative: 1 %
Eosinophils Absolute: 0.3 10*3/uL (ref 0.0–0.5)
Eosinophils Relative: 5 %
HCT: 36.8 % (ref 36.0–46.0)
Hemoglobin: 11.8 g/dL — ABNORMAL LOW (ref 12.0–15.0)
Immature Granulocytes: 0 %
Lymphocytes Relative: 27 %
Lymphs Abs: 1.7 10*3/uL (ref 0.7–4.0)
MCH: 30.7 pg (ref 26.0–34.0)
MCHC: 32.1 g/dL (ref 30.0–36.0)
MCV: 95.8 fL (ref 80.0–100.0)
Monocytes Absolute: 0.5 10*3/uL (ref 0.1–1.0)
Monocytes Relative: 8 %
Neutro Abs: 3.7 10*3/uL (ref 1.7–7.7)
Neutrophils Relative %: 59 %
Platelet Count: 315 10*3/uL (ref 150–400)
RBC: 3.84 MIL/uL — ABNORMAL LOW (ref 3.87–5.11)
RDW: 14.1 % (ref 11.5–15.5)
WBC Count: 6.2 10*3/uL (ref 4.0–10.5)
nRBC: 0 % (ref 0.0–0.2)

## 2022-03-23 LAB — CMP (CANCER CENTER ONLY)
ALT: 11 U/L (ref 0–44)
AST: 13 U/L — ABNORMAL LOW (ref 15–41)
Albumin: 4.4 g/dL (ref 3.5–5.0)
Alkaline Phosphatase: 122 U/L (ref 38–126)
Anion gap: 7 (ref 5–15)
BUN: 33 mg/dL — ABNORMAL HIGH (ref 8–23)
CO2: 27 mmol/L (ref 22–32)
Calcium: 9.9 mg/dL (ref 8.9–10.3)
Chloride: 106 mmol/L (ref 98–111)
Creatinine: 2.57 mg/dL — ABNORMAL HIGH (ref 0.44–1.00)
GFR, Estimated: 19 mL/min — ABNORMAL LOW (ref >60)
Glucose, Bld: 113 mg/dL — ABNORMAL HIGH (ref 70–99)
Potassium: 4.4 mmol/L (ref 3.5–5.1)
Sodium: 140 mmol/L (ref 135–145)
Total Bilirubin: 0.7 mg/dL (ref 0.3–1.2)
Total Protein: 8.7 g/dL — ABNORMAL HIGH (ref 6.5–8.1)

## 2022-03-23 NOTE — Assessment & Plan Note (Signed)
Lab review: 11/14/2020: M spike: 1.9 g, IgG 2884, immunofixation shows IgG kappa Creatinine: 2.38 (baseline creatinine 1.6), calcium 9.7, albumin 4.5, hemoglobin 13.1 11/29/2020: M spike 1.6 g IgG kappa,Kappa lambda ratio: 9 (Kappa 136.9, lambda 15.2),beta-2 microglobulin: 3.4, creatinine 2.3, hemoglobin 12 03/17/2021: M spike: 1.3 g IgG kappa, kappa lambda ratio 7.72 (Kappa 195, lambda 25), creatinine 1.95, calcium 9.9, albumin 3.8, hemoglobin 10 09/13/2021: M spike 1.6 g, IgG kappa, IgG level 2091 Kappa to 72, lambda 21.1: Ratio 12.89, creatinine 2.36, calcium 10   12/09/2020: Bone survey: No lytic lesions The serum M protein levels have been fluctuating but overall they have been lower than the time of diagnosis in October 22 with a M spike of 1.9 g.  Acute renal failure could be related to underlying cardiac issues. Anemia due to chronic kidney disease  We will obtain blood work today. Return to clinic in 6 months for labs and follow-up couple of days later to discuss results

## 2022-03-23 NOTE — Assessment & Plan Note (Signed)
History of breast cancer: Currently on anastrozole therapy and doing well. Breast cancer surveillance:  Mammogram 03/08/2022: Benign breast density category A Breast exam 03/23/2022: Benign

## 2022-03-28 ENCOUNTER — Telehealth: Payer: Self-pay | Admitting: Nurse Practitioner

## 2022-03-28 ENCOUNTER — Other Ambulatory Visit: Payer: Self-pay | Admitting: Nurse Practitioner

## 2022-03-28 ENCOUNTER — Other Ambulatory Visit: Payer: Self-pay | Admitting: Pharmacist

## 2022-03-28 DIAGNOSIS — E119 Type 2 diabetes mellitus without complications: Secondary | ICD-10-CM

## 2022-03-28 DIAGNOSIS — I10 Essential (primary) hypertension: Secondary | ICD-10-CM

## 2022-03-28 DIAGNOSIS — Z794 Long term (current) use of insulin: Secondary | ICD-10-CM

## 2022-03-28 DIAGNOSIS — E118 Type 2 diabetes mellitus with unspecified complications: Secondary | ICD-10-CM

## 2022-03-28 MED ORDER — VICTOZA 18 MG/3ML ~~LOC~~ SOPN: 1.8000 mg | PEN_INJECTOR | Freq: Every day | SUBCUTANEOUS | 0 refills | Status: DC

## 2022-03-28 MED ORDER — FLUTICASONE FUROATE-VILANTEROL 200-25 MCG/ACT IN AEPB: 1.0000 | INHALATION_SPRAY | Freq: Every day | RESPIRATORY_TRACT | 0 refills | Status: DC

## 2022-03-28 MED ORDER — LANTUS SOLOSTAR 100 UNIT/ML ~~LOC~~ SOPN: 5.0000 [IU] | PEN_INJECTOR | Freq: Every day | SUBCUTANEOUS | 0 refills | Status: DC

## 2022-03-28 MED ORDER — METOPROLOL SUCCINATE ER 50 MG PO TB24: 50.0000 mg | ORAL_TABLET | Freq: Every day | ORAL | 0 refills | Status: DC

## 2022-03-28 MED ORDER — EASY TOUCH PEN NEEDLES 31G X 8 MM MISC: 0 refills | Status: DC

## 2022-03-28 MED ORDER — AMLODIPINE BESYLATE 10 MG PO TABS: 10.0000 mg | ORAL_TABLET | Freq: Every day | ORAL | 0 refills | Status: DC

## 2022-03-28 NOTE — Telephone Encounter (Signed)
Levada Dy with [facility] is calling on behalf of the patient. Levada Dy is asking if the fax they sent over to Dr. Raul Del regarding the medications SYMBICORT 160-4.5 MCG/ACT inhaler [id number] , EASY TOUCH PEN NEEDLES 31G X 8 MM [place] [id number]. Please follow up with Levada Dy at. [phone]

## 2022-03-28 NOTE — Telephone Encounter (Signed)
Medication Refill - Medication: metoprolol succinate (TOPROL-XL) 50 MG 24 hr tablet , amLODipine (NORVASC) 10 MG tablet , LANTUS SOLOSTAR 100 UNIT/ML Solostar Pen , VICTOZA 18 MG/3ML SOPN   Has the patient contacted their pharmacy? Yes.   Pharmacy calling.   (Agent: If yes, when and what did the pharmacy advise?)  Preferred Pharmacy (with phone number or street name):  [facility] - [place], [address]  [place] [address]  Phone: [phone] Fax: [phone]  Hours: Not open 24 hours   Has the patient been seen for an appointment in the last year OR does the patient have an upcoming appointment? No.  Agent: Please be advised that RX refills may take up to 3 business days. We ask that you follow-up with your pharmacy.

## 2022-03-28 NOTE — Telephone Encounter (Signed)
Unable to refill per protocol, Rx request is too soon. Last refilled 03/28/22.  Requested Prescriptions  Pending Prescriptions Disp Refills   metoprolol succinate (TOPROL-XL) 50 MG 24 hr tablet 30 tablet 0    Sig: Take 1 tablet (50 mg total) by mouth daily. Must have office visit for refills     Cardiovascular:  Beta Blockers Failed - 03/28/2022 11:11 AM      Failed - Valid encounter within last 6 months    Recent Outpatient Visits           6 months ago Chronic obstructive pulmonary disease, unspecified COPD type [facility])   [place], [place], [place]   10 months ago Type 2 diabetes mellitus with complication, without long-term current use of insulin [facility])   [place], Vernia Buff, NP   11 months ago Essential hypertension   [place], RPH-CPP   12 months ago Essential hypertension   [place], Vernia Buff, NP   1 year ago Essential hypertension   [place], [place] W, NP              Passed - Last BP in normal range    BP Readings from Last 1 Encounters:  03/23/22 113/74         Passed - Last Heart Rate in normal range    Pulse Readings from Last 1 Encounters:  03/23/22 80          liraglutide (VICTOZA) 18 MG/3ML SOPN 9 mL 0    Sig: Inject 1.8 mg into the skin daily. Must have office visit for refills     Endocrinology:  Diabetes - GLP-1 Receptor Agonists Failed - 03/28/2022 11:11 AM      Failed - HBA1C is between 0 and 7.9 and within 180 days    Hgb A1c MFr Bld  Date Value Ref Range Status  02/08/2021 5.9 (H) 4.8 - 5.6 % Final    Comment:    (NOTE) Pre diabetes:          5.7%-6.4%  Diabetes:              >6.4%  Glycemic control for   <7.0% adults with diabetes          Failed - Valid encounter within last 6 months    Recent Outpatient  Visits           6 months ago Chronic obstructive pulmonary disease, unspecified COPD type [facility])   [place], [place], [place]   10 months ago Type 2 diabetes mellitus with complication, without long-term current use of insulin [facility])   [place], Vernia Buff, NP   11 months ago Essential hypertension   [place], RPH-CPP   12 months ago Essential hypertension   [place], Vernia Buff, NP   1 year ago Essential hypertension   [place], [place] W, NP               LANTUS SOLOSTAR 100 UNIT/ML Solostar Pen 15 mL 0    Sig: Inject 5 Units into the skin at bedtime. Must have office visit for refills  Endocrinology:  Diabetes - Insulins Failed - 03/28/2022 11:11 AM      Failed - HBA1C is between 0 and 7.9 and within 180 days    Hgb A1c MFr Bld  Date Value Ref Range Status  02/08/2021 5.9 (H) 4.8 - 5.6 % Final    Comment:    (NOTE) Pre diabetes:          5.7%-6.4%  Diabetes:              >6.4%  Glycemic control for   <7.0% adults with diabetes          Failed - Valid encounter within last 6 months    Recent Outpatient Visits           6 months ago Chronic obstructive pulmonary disease, unspecified COPD type [facility])   [place], [place], [place]   10 months ago Type 2 diabetes mellitus with complication, without long-term current use of insulin [facility])   [place], Vernia Buff, NP   11 months ago Essential hypertension   [place], RPH-CPP   12 months ago Essential hypertension   [place], Vernia Buff, NP   1 year ago Essential hypertension   [place], NP               amLODipine (NORVASC) 10 MG tablet 30 tablet 0    Sig: Take 1 tablet (10 mg total) by mouth daily. Please make an appt with your PCP for additional refills.     Cardiovascular: Calcium Channel Blockers 2 Failed - 03/28/2022 11:11 AM      Failed - Valid encounter within last 6 months    Recent Outpatient Visits           6 months ago Chronic obstructive pulmonary disease, unspecified COPD type [facility])   [place], [place], [place]   10 months ago Type 2 diabetes mellitus with complication, without long-term current use of insulin [facility])   [place], Vernia Buff, NP   11 months ago Essential hypertension   [place], RPH-CPP   12 months ago Essential hypertension   [place], Vernia Buff, NP   1 year ago Essential hypertension   [place], [place] W, NP              Passed - Last BP in normal range    BP Readings from Last 1 Encounters:  03/23/22 113/74         Passed - Last Heart Rate in normal range    Pulse Readings from Last 1 Encounters:  03/23/22 80

## 2022-03-28 NOTE — Telephone Encounter (Signed)
Requested rxs refilled and sent to [facility]. However, I can only authorize 1 month. Patient has not been seen since 09/2021 and will need to make an appointment before I can authorize more refills.

## 2022-03-29 LAB — MULTIPLE MYELOMA PANEL, SERUM
Albumin SerPl Elph-Mcnc: 4.1 g/dL (ref 2.9–4.4)
Albumin/Glob SerPl: 1.1 (ref 0.7–1.7)
Alpha 1: 0.2 g/dL (ref 0.0–0.4)
Alpha2 Glob SerPl Elph-Mcnc: 0.9 g/dL (ref 0.4–1.0)
B-Globulin SerPl Elph-Mcnc: 0.9 g/dL (ref 0.7–1.3)
Gamma Glob SerPl Elph-Mcnc: 2 g/dL — ABNORMAL HIGH (ref 0.4–1.8)
Globulin, Total: 4 g/dL — ABNORMAL HIGH (ref 2.2–3.9)
IgA: 82 mg/dL (ref 64–422)
IgG (Immunoglobin G), Serum: 2659 mg/dL — ABNORMAL HIGH (ref 586–1602)
IgM (Immunoglobulin M), Srm: 14 mg/dL — ABNORMAL LOW (ref 26–217)
M Protein SerPl Elph-Mcnc: 1.6 g/dL — ABNORMAL HIGH
Total Protein ELP: 8.1 g/dL (ref 6.0–8.5)

## 2022-04-10 ENCOUNTER — Other Ambulatory Visit: Payer: Self-pay

## 2022-04-10 ENCOUNTER — Other Ambulatory Visit: Payer: Self-pay | Admitting: Family Medicine

## 2022-04-10 DIAGNOSIS — I10 Essential (primary) hypertension: Secondary | ICD-10-CM

## 2022-04-10 NOTE — Telephone Encounter (Signed)
Requested Prescriptions  Pending Prescriptions Disp Refills   amLODipine (NORVASC) 10 MG tablet [Pharmacy Med Name: amlodipine 10 mg tablet] 30 tablet 11    Sig: TAKE ONE TABLET BY MOUTH DAILY AT 9 AM Please make an appt with your PCP for additional refills.     Cardiovascular: Calcium Channel Blockers 2 Failed - 04/10/2022  2:58 AM      Failed - Valid encounter within last 6 months    Recent Outpatient Visits           6 months ago Chronic obstructive pulmonary disease, unspecified COPD type [facility])   [place], [place], [place]   11 months ago Type 2 diabetes mellitus with complication, without long-term current use of insulin [facility])   [place], Vernia Buff, NP   11 months ago Essential hypertension   [place], RPH-CPP   1 year ago Essential hypertension   [place], Vernia Buff, NP   1 year ago Essential hypertension   [place], [place] W, NP              Passed - Last BP in normal range    BP Readings from Last 1 Encounters:  03/23/22 113/74         Passed - Last Heart Rate in normal range    Pulse Readings from Last 1 Encounters:  03/23/22 80

## 2022-04-11 ENCOUNTER — Other Ambulatory Visit: Payer: Self-pay

## 2022-04-16 ENCOUNTER — Other Ambulatory Visit: Payer: Self-pay | Admitting: Nurse Practitioner

## 2022-04-16 DIAGNOSIS — J418 Mixed simple and mucopurulent chronic bronchitis: Secondary | ICD-10-CM

## 2022-04-27 ENCOUNTER — Emergency Department (HOSPITAL_COMMUNITY): Payer: 59

## 2022-04-27 ENCOUNTER — Emergency Department (HOSPITAL_COMMUNITY)
Admission: EM | Admit: 2022-04-27 | Discharge: 2022-04-27 | Disposition: A | Discharge status: Home / Self Care | Payer: 59 | Attending: Emergency Medicine | Admitting: Emergency Medicine

## 2022-04-27 ENCOUNTER — Encounter (HOSPITAL_COMMUNITY): Payer: Self-pay

## 2022-04-27 ENCOUNTER — Other Ambulatory Visit: Payer: Self-pay

## 2022-04-27 DIAGNOSIS — J449 Chronic obstructive pulmonary disease, unspecified: Secondary | ICD-10-CM | POA: Diagnosis not present

## 2022-04-27 DIAGNOSIS — R0789 Other chest pain: Secondary | ICD-10-CM | POA: Diagnosis not present

## 2022-04-27 DIAGNOSIS — N189 Chronic kidney disease, unspecified: Secondary | ICD-10-CM | POA: Diagnosis not present

## 2022-04-27 DIAGNOSIS — I2510 Atherosclerotic heart disease of native coronary artery without angina pectoris: Secondary | ICD-10-CM | POA: Insufficient documentation

## 2022-04-27 DIAGNOSIS — E1122 Type 2 diabetes mellitus with diabetic chronic kidney disease: Secondary | ICD-10-CM | POA: Insufficient documentation

## 2022-04-27 DIAGNOSIS — I129 Hypertensive chronic kidney disease with stage 1 through stage 4 chronic kidney disease, or unspecified chronic kidney disease: Secondary | ICD-10-CM | POA: Diagnosis not present

## 2022-04-27 DIAGNOSIS — R079 Chest pain, unspecified: Secondary | ICD-10-CM

## 2022-04-27 LAB — COMPREHENSIVE METABOLIC PANEL
ALT: 17 U/L (ref 0–44)
AST: 19 U/L (ref 15–41)
Albumin: 3.8 g/dL (ref 3.5–5.0)
Alkaline Phosphatase: 123 U/L (ref 38–126)
Anion gap: 10 (ref 5–15)
BUN: 29 mg/dL — ABNORMAL HIGH (ref 8–23)
CO2: 19 mmol/L — ABNORMAL LOW (ref 22–32)
Calcium: 9.4 mg/dL (ref 8.9–10.3)
Chloride: 109 mmol/L (ref 98–111)
Creatinine, Ser: 2.32 mg/dL — ABNORMAL HIGH (ref 0.44–1.00)
GFR, Estimated: 22 mL/min — ABNORMAL LOW (ref >60)
Glucose, Bld: 99 mg/dL (ref 70–99)
Potassium: 4.2 mmol/L (ref 3.5–5.1)
Sodium: 138 mmol/L (ref 135–145)
Total Bilirubin: 0.6 mg/dL (ref 0.3–1.2)
Total Protein: 8.2 g/dL — ABNORMAL HIGH (ref 6.5–8.1)

## 2022-04-27 LAB — CBC WITH DIFFERENTIAL/PLATELET
Abs Immature Granulocytes: 0.04 10*3/uL (ref 0.00–0.07)
Basophils Absolute: 0 10*3/uL (ref 0.0–0.1)
Basophils Relative: 1 %
Eosinophils Absolute: 0.3 10*3/uL (ref 0.0–0.5)
Eosinophils Relative: 4 %
HCT: 37.9 % (ref 36.0–46.0)
Hemoglobin: 11.9 g/dL — ABNORMAL LOW (ref 12.0–15.0)
Immature Granulocytes: 1 %
Lymphocytes Relative: 36 %
Lymphs Abs: 2.3 10*3/uL (ref 0.7–4.0)
MCH: 30.8 pg (ref 26.0–34.0)
MCHC: 31.4 g/dL (ref 30.0–36.0)
MCV: 98.2 fL (ref 80.0–100.0)
Monocytes Absolute: 0.6 10*3/uL (ref 0.1–1.0)
Monocytes Relative: 10 %
Neutro Abs: 3.1 10*3/uL (ref 1.7–7.7)
Neutrophils Relative %: 48 %
Platelets: 268 10*3/uL (ref 150–400)
RBC: 3.86 MIL/uL — ABNORMAL LOW (ref 3.87–5.11)
RDW: 14.9 % (ref 11.5–15.5)
WBC: 6.3 10*3/uL (ref 4.0–10.5)
nRBC: 0 % (ref 0.0–0.2)

## 2022-04-27 LAB — TROPONIN I (HIGH SENSITIVITY)
Troponin I (High Sensitivity): 5 ng/L (ref <18)
Troponin I (High Sensitivity): 5 ng/L (ref <18)

## 2022-04-27 LAB — LIPASE, BLOOD: Lipase: 81 U/L — ABNORMAL HIGH (ref 11–51)

## 2022-04-27 MED ORDER — FENTANYL CITRATE PF 50 MCG/ML IJ SOSY
50.0000 ug | PREFILLED_SYRINGE | Freq: Once | INTRAMUSCULAR | Status: AC
  Administered 2022-04-27: 50 ug via INTRAVENOUS
  Filled 2022-04-27: qty 1

## 2022-04-27 MED ORDER — LOSARTAN POTASSIUM 50 MG PO TABS
50.0000 mg | ORAL_TABLET | Freq: Once | ORAL | Status: AC
  Administered 2022-04-27: 50 mg via ORAL
  Filled 2022-04-27: qty 1

## 2022-04-27 MED ORDER — AMLODIPINE BESYLATE 5 MG PO TABS
10.0000 mg | ORAL_TABLET | Freq: Once | ORAL | Status: AC
  Administered 2022-04-27: 10 mg via ORAL
  Filled 2022-04-27: qty 2

## 2022-04-27 MED ORDER — FAMOTIDINE 20 MG PO TABS
20.0000 mg | ORAL_TABLET | Freq: Once | ORAL | Status: AC
  Administered 2022-04-27: 20 mg via ORAL
  Filled 2022-04-27: qty 1

## 2022-04-27 MED ORDER — KETOROLAC TROMETHAMINE 15 MG/ML IJ SOLN
15.0000 mg | Freq: Once | INTRAMUSCULAR | Status: AC
  Administered 2022-04-27: 15 mg via INTRAVENOUS
  Filled 2022-04-27: qty 1

## 2022-04-27 MED ORDER — ACETAMINOPHEN 500 MG PO TABS
1000.0000 mg | ORAL_TABLET | Freq: Once | ORAL | Status: AC
  Administered 2022-04-27: 1000 mg via ORAL
  Filled 2022-04-27: qty 2

## 2022-04-27 NOTE — ED Triage Notes (Signed)
Pt BIB [facility] from urgent care c/o centralized CP that radiates to her back that started this morning after attempting to pick up some jeans. Pt had 324 ASA and 0.4 Nitro with urgent care. Pt endorses SHOB and anxiety. Pt is extremely anxious in triage.

## 2022-04-27 NOTE — ED Provider Triage Note (Signed)
Emergency Medicine Provider Triage Evaluation Note  [name] , a 72 y.o. female  was evaluated in triage.  Pt complains of chest pain, shortness of breath.  Patient states that chest pain began when she was doing laundry earlier today.  She reports lower central chest pain with radiation to her back.  States the pain has been constant since onset.  States he tried to take some NSAIDs at home which did not relieve symptoms.  Was given aspirin and nitroglycerin via EMS which did not help her symptoms.  States she had similar symptoms 6 months ago with negative workup.  Denies fever, chills, cough, congestion, nausea, vomiting, abdominal pain..  Review of Systems  Positive: See above Negative:   Physical Exam  BP (!) 160/83 (BP Location: Right Arm)   Pulse 71   Temp 98.3 F (36.8 C) (Oral)   Resp 20   Ht 4\' 11"  (1.499 m)   Wt 58.1 kg   LMP  (LMP Unknown) Comment: tubal ligation  SpO2 97%   BMI 25.85 kg/m  Gen:   Awake, no distress   Resp:  Normal effort  MSK:   Moves extremities without difficulty  Other:  Patient tachypneic.  Epigastric tenderness to palpation.  Medical Decision Making  Medically screening exam initiated at 1:08 PM.  Appropriate orders placed.  [name] was informed that the remainder of the evaluation will be completed by another provider, this initial triage assessment does not replace that evaluation, and the importance of remaining in the ED until their evaluation is complete.     Wilnette Kales, [place] 04/27/22 1309

## 2022-04-27 NOTE — ED Provider Notes (Signed)
[facility] Provider Note   CSN: [id number] Arrival date & time: 04/27/22  1248     History Chief Complaint  Patient presents with   Chest Pain    HPI [name] is a 72 y.o. female presenting for chief complaint of recurrent chest pain.  States that this morning she had sudden onset left-sided chest pain radiating to her back.  Very similar to patient's presentation last August.  She has an extensive history including COPD's, 2 L at baseline, CAD, diabetes hypertension hyperlipidemia.  She denies fevers chills nausea vomiting syncope shortness of breath.  States that she is hungry and feels like that is making her pain worse.  No medication prior to arrival.  Otherwise ambulatory tolerating p.o. intake.  States that she missed her medications this morning.   Patient's recorded medical, surgical, social, medication list and allergies were reviewed in the Snapshot window as part of the initial history.   Review of Systems   Review of Systems  Constitutional:  Negative for chills and fever.  HENT:  Negative for ear pain and sore throat.   Eyes:  Negative for pain and visual disturbance.  Respiratory:  Positive for chest tightness. Negative for cough and shortness of breath.   Cardiovascular:  Positive for chest pain. Negative for palpitations.  Gastrointestinal:  Negative for abdominal pain and vomiting.  Genitourinary:  Negative for dysuria and hematuria.  Musculoskeletal:  Negative for arthralgias and back pain.  Skin:  Negative for color change and rash.  Neurological:  Negative for seizures and syncope.  All other systems reviewed and are negative.   Physical Exam Updated Vital Signs BP (!) 175/69   Pulse 65   Temp 98.2 F (36.8 C) (Oral)   Resp 15   Ht 4\' 11"  (1.499 m)   Wt 58.1 kg   LMP  (LMP Unknown) Comment: tubal ligation  SpO2 100%   BMI 25.85 kg/m  Physical Exam Vitals and nursing note reviewed.   Constitutional:      General: She is not in acute distress.    Appearance: She is well-developed.  HENT:     Head: Normocephalic and atraumatic.  Eyes:     Conjunctiva/sclera: Conjunctivae normal.  Cardiovascular:     Rate and Rhythm: Normal rate and regular rhythm.     Heart sounds: No murmur heard. Pulmonary:     Effort: Pulmonary effort is normal. No respiratory distress.     Breath sounds: Normal breath sounds.  Chest:     Chest wall: Tenderness present.  Abdominal:     Palpations: Abdomen is soft.     Tenderness: There is no abdominal tenderness.  Musculoskeletal:        General: No swelling.     Cervical back: Neck supple.  Skin:    General: Skin is warm and dry.     Capillary Refill: Capillary refill takes less than 2 seconds.  Neurological:     Mental Status: She is alert.  Psychiatric:        Mood and Affect: Mood normal.      ED Course/ Medical Decision Making/ A&P    Procedures Procedures   Medications Ordered in ED Medications  acetaminophen (TYLENOL) tablet 1,000 mg (1,000 mg Oral Given 04/27/22 1532)  ketorolac (TORADOL) 15 MG/ML injection 15 mg (15 mg Intravenous Given 04/27/22 1533)  famotidine (PEPCID) tablet 20 mg (20 mg Oral Given 04/27/22 1533)  amLODipine (NORVASC) tablet 10 mg (10 mg Oral Given 04/27/22  1533)  losartan (COZAAR) tablet 50 mg (50 mg Oral Given 04/27/22 1533)  fentaNYL (SUBLIMAZE) injection 50 mcg (50 mcg Intravenous Given 04/27/22 1637)    Medical Decision Making: [name] is a 72 y.o. female who presented to the ED today with chest pain shortness of breath detailed above.     Additional history discussed with patient's family/caregivers.  Patient's presentation is complicated by their history of extensive medical history, significant CAD history.  Patient placed on continuous vitals and telemetry monitoring while in ED which was reviewed periodically.  Complete initial physical exam performed, notably the patient  was  hemodynamically stable in no acute distress.      Reviewed and confirmed nursing documentation for past medical history, family history, social history.    Initial Assessment: With the patient's presentation of left-sided chest pain, most likely diagnosis is musculoskeletal chest pain versus GERD, although ACS remains on the differential. Other diagnoses were considered including (but not limited to) pulmonary embolism, community-acquired pneumonia, aortic dissection, pneumothorax, underlying bony abnormality, anemia. These are considered less likely due to history of present illness and physical exam findings.    In particular, concerning pulmonary embolism: they deny malignancy, recent surgery, history of DVT, or calf tenderness leading to a low risk Wells score. Aortic Dissection also reconsidered but seems less likely based on the location, quality, onset, and severity of symptoms in this case. Patient has a lack of serious comorbidities for this condition including a lack of Smoking. Patient also has a lack of underlying history of AD or TAA.  This is most consistent with an acute life/limb threatening illness complicated by underlying chronic conditions.   Initial Plan: Evaluate for ACS with delta troponin and EKG evaluated as below  Evaluate for dissection, bony abnormality, or pneumonia with chest x-ray and screening laboratory evaluation including CBC, BMP  Further evaluation for pulmonary embolism not indicated at this time based on patient's Wells score.  Further evaluation for Thoracic Aortic Dissection not indicated at this time based on patient's clinical history and PE findings.   Initial Study Results: EKG was reviewed independently. Rate, rhythm, axis, intervals all examined and without medically relevant abnormality. ST segments without concerns for elevations.    Laboratory  Delta troponin demonstrated no acute pathology  CBC and BMP without obvious metabolic or inflammatory  abnormalities requiring further evaluation   Radiology  DG Chest Port 1 View  Result Date: 04/27/2022 CLINICAL DATA:  Chest pain radiating to the back EXAM: PORTABLE CHEST 1 VIEW COMPARISON:  10/04/2021, CT 03/22/2018, chest x-ray 04/12/2021 FINDINGS: Left shoulder replacement. No acute airspace disease, pleural effusion or pneumothorax. Borderline to mild cardiomegaly with aortic atherosclerosis. IMPRESSION: No active disease. Borderline to mild cardiomegaly. Electronically Signed   By: Donavan Foil M.D.   On: 04/27/2022 15:15    Final Assessment and Plan: I was called over to patient's bedside.  After medication ministration she states that all of her pain is resolved and she is already called her ride.  She has a family member at bedside.  She denies fevers chills nausea vomiting syncope shortness of breath and feels comfortable outpatient care management this time.  Similar to her presentation in August, I discussed the limitations of her evaluation secondary to her CKD and inability to evaluate for pulmonary embolism, aortic dissection, patient expressed understanding but continues to feel comfortable with outpatient care and management. Disposition:  I have considered need for hospitalization, however, considering all of the above, I believe this patient is stable  for discharge at this time.  Patient/family educated about specific return precautions for given chief complaint and symptoms.  Patient/family educated about follow-up with PCP.     Patient/family expressed understanding of return precautions and need for follow-up. Patient spoken to regarding all imaging and laboratory results and appropriate follow up for these results. All education provided in verbal form with additional information in written form. Time was allowed for answering of patient questions. Patient discharged.    Emergency Department Medication Summary:   Medications  acetaminophen (TYLENOL) tablet 1,000 mg (1,000  mg Oral Given 04/27/22 1532)  ketorolac (TORADOL) 15 MG/ML injection 15 mg (15 mg Intravenous Given 04/27/22 1533)  famotidine (PEPCID) tablet 20 mg (20 mg Oral Given 04/27/22 1533)  amLODipine (NORVASC) tablet 10 mg (10 mg Oral Given 04/27/22 1533)  losartan (COZAAR) tablet 50 mg (50 mg Oral Given 04/27/22 1533)  fentaNYL (SUBLIMAZE) injection 50 mcg (50 mcg Intravenous Given 04/27/22 1637)                  Clinical Impression:  1. Chest pain, unspecified type      Discharge   Final Clinical Impression(s) / ED Diagnoses Final diagnoses:  Chest pain, unspecified type    Rx / DC Orders ED Discharge Orders     None         Tretha Sciara, MD 04/27/22 Vernelle [place]ald

## 2022-05-01 NOTE — Telephone Encounter (Signed)
Requested medication (s) are due for refill today: yes  Requested medication (s) are on the active medication list: yes  Last refill:  03/28/22 #30/0  Future visit scheduled: no  Notes to clinic:  pt is due for OV, called pt, unable to LVM. Please advise      Requested Prescriptions  Pending Prescriptions Disp Refills   amLODipine (NORVASC) 10 MG tablet 30 tablet 0    Sig: Take 1 tablet (10 mg total) by mouth daily. Please make an appt with your PCP for additional refills.     Cardiovascular: Calcium Channel Blockers 2 Failed - 05/01/2022  5:34 PM      Failed - Last BP in normal range    BP Readings from Last 1 Encounters:  04/27/22 (!) 175/69         Failed - Valid encounter within last 6 months    Recent Outpatient Visits           7 months ago Chronic obstructive pulmonary disease, unspecified COPD type [facility])   [place], [place], [place]   11 months ago Type 2 diabetes mellitus with complication, without long-term current use of insulin [facility])   [place], Vernia Buff, NP   1 year ago Essential hypertension   [place], Jarome Matin, RPH-CPP   1 year ago Essential hypertension   [place], Vernia Buff, NP   1 year ago Essential hypertension   [place], NP              Passed - Last Heart Rate in normal range    Pulse Readings from Last 1 Encounters:  04/27/22 65         Refused Prescriptions Disp Refills   amLODipine (NORVASC) 10 MG tablet [Pharmacy Med Name: amlodipine 10 mg tablet] 30 tablet 11    Sig: TAKE ONE TABLET BY MOUTH DAILY AT 9 AM Please make an appt with your PCP for additional refills.     Cardiovascular: Calcium Channel Blockers 2 Failed - 05/01/2022  5:34 PM      Failed - Last BP in normal range    BP Readings from Last 1  Encounters:  04/27/22 (!) 175/69         Failed - Valid encounter within last 6 months    Recent Outpatient Visits           7 months ago Chronic obstructive pulmonary disease, unspecified COPD type [facility])   [place], [place], [place]   11 months ago Type 2 diabetes mellitus with complication, without long-term current use of insulin [facility])   [place], Vernia Buff, NP   1 year ago Essential hypertension   [place], Jarome Matin, RPH-CPP   1 year ago Essential hypertension   [place], Vernia Buff, NP   1 year ago Essential hypertension   [place], NP              Passed - Last Heart Rate in normal range    Pulse Readings from Last 1 Encounters:  04/27/22 65

## 2022-05-01 NOTE — Telephone Encounter (Signed)
Jalina with [facility] is calling to check on the status of the refill request and is wanting to see if the refill can be sent in since it is after 04/26/22. Please advise

## 2022-05-01 NOTE — Telephone Encounter (Signed)
Pt called, unable to LVM d/t VM not set up. Pt is due for OV with [facility] since last OV was 09/14/21. Will route rx request to provider since pt has been given 30 DS and pharmacy has requested refill.  Requested Prescriptions  Pending Prescriptions Disp Refills   amLODipine (NORVASC) 10 MG tablet 30 tablet 0    Sig: Take 1 tablet (10 mg total) by mouth daily. Please make an appt with your PCP for additional refills.     Cardiovascular: Calcium Channel Blockers 2 Failed - 05/01/2022  5:34 PM      Failed - Last BP in normal range    BP Readings from Last 1 Encounters:  04/27/22 (!) 175/69         Failed - Valid encounter within last 6 months    Recent Outpatient Visits           7 months ago Chronic obstructive pulmonary disease, unspecified COPD type [facility])   [place], [place], [place]   11 months ago Type 2 diabetes mellitus with complication, without long-term current use of insulin [facility])   [place], Vernia Buff, NP   1 year ago Essential hypertension   [place], Jarome Matin, RPH-CPP   1 year ago Essential hypertension   [place], Vernia Buff, NP   1 year ago Essential hypertension   [place], NP              Passed - Last Heart Rate in normal range    Pulse Readings from Last 1 Encounters:  04/27/22 65         Refused Prescriptions Disp Refills   amLODipine (NORVASC) 10 MG tablet [Pharmacy Med Name: amlodipine 10 mg tablet] 30 tablet 11    Sig: TAKE ONE TABLET BY MOUTH DAILY AT 9 AM Please make an appt with your PCP for additional refills.     Cardiovascular: Calcium Channel Blockers 2 Failed - 05/01/2022  5:34 PM      Failed - Last BP in normal range    BP Readings from Last 1 Encounters:  04/27/22 (!) 175/69         Failed - Valid encounter  within last 6 months    Recent Outpatient Visits           7 months ago Chronic obstructive pulmonary disease, unspecified COPD type [facility])   [place], [place], [place]   11 months ago Type 2 diabetes mellitus with complication, without long-term current use of insulin [facility])   [place], Vernia Buff, NP   1 year ago Essential hypertension   [place], Jarome Matin, RPH-CPP   1 year ago Essential hypertension   [place], Vernia Buff, NP   1 year ago Essential hypertension   [place], NP              Passed - Last Heart Rate in normal range    Pulse Readings from Last 1 Encounters:  04/27/22 65

## 2022-05-01 NOTE — Addendum Note (Signed)
Addended by: Abbie Sons L on: 05/01/2022 05:41 PM   Modules accepted: Orders

## 2022-05-01 NOTE — Addendum Note (Signed)
Addended by: Erie Noe on: 05/01/2022 05:34 PM   Modules accepted: Orders

## 2022-05-04 ENCOUNTER — Other Ambulatory Visit: Payer: Self-pay | Admitting: Family Medicine

## 2022-05-04 DIAGNOSIS — I10 Essential (primary) hypertension: Secondary | ICD-10-CM

## 2022-05-16 ENCOUNTER — Other Ambulatory Visit: Payer: Self-pay | Admitting: Family Medicine

## 2022-05-16 DIAGNOSIS — E118 Type 2 diabetes mellitus with unspecified complications: Secondary | ICD-10-CM

## 2022-05-31 ENCOUNTER — Other Ambulatory Visit: Payer: Self-pay | Admitting: Nurse Practitioner

## 2022-05-31 NOTE — Telephone Encounter (Signed)
Requested medications are due for refill today.  yes  Requested medications are on the active medications list.  yes  Last refill. 03/28/2022 #30 0 rf  Future visit scheduled.   no  Notes to clinic.  Pt already given a  courtesy refill. [place] listed as PCP.    Requested Prescriptions  Pending Prescriptions Disp Refills   metoprolol succinate (TOPROL-XL) 50 MG 24 hr tablet [Pharmacy Med Name: metoprolol succinate ER 50 mg tablet,extended release 24 hr] 90 tablet 2    Sig: TAKE ONE TABLET BY MOUTH DAILY IMMEDIATELY FOLLOWING A MEAL     Cardiovascular:  Beta Blockers Failed - 05/31/2022  8:01 AM      Failed - Last BP in normal range    BP Readings from Last 1 Encounters:  04/27/22 (!) 175/69         Failed - Valid encounter within last 6 months    Recent Outpatient Visits           8 months ago Chronic obstructive pulmonary disease, unspecified COPD type [facility])   [place] [facility] [place], [place], [place]   1 year ago Type 2 diabetes mellitus with complication, without long-term current use of insulin [facility])   [place] [place], Shea Stakes, NP   1 year ago Essential hypertension   [place] [facility] [place], Cornelius Moras, RPH-CPP   1 year ago Essential hypertension   [place] [facility] [place], Shea Stakes, NP   1 year ago Essential hypertension   [place] [facility] [place], [place] W, NP              Passed - Last Heart Rate in normal range    Pulse Readings from Last 1 Encounters:  04/27/22 65

## 2022-06-04 ENCOUNTER — Other Ambulatory Visit: Payer: Self-pay | Admitting: Family Medicine

## 2022-06-04 DIAGNOSIS — I10 Essential (primary) hypertension: Secondary | ICD-10-CM

## 2022-06-05 NOTE — Telephone Encounter (Signed)
Requested medication (s) are due for refill today: yes  Requested medication (s) are on the active medication list: yes  Last refill:  03/28/22  Future visit scheduled: yes  Notes to clinic:  Unable to refill per protocol, courtesy refill already given, routing for provider approval.      Requested Prescriptions  Pending Prescriptions Disp Refills   amLODipine (NORVASC) 10 MG tablet [Pharmacy Med Name: amlodipine 10 mg tablet] 30 tablet 0    Sig: TAKE ONE TABLET BY MOUTH DAILY AT 9 AM Please make an appt with your PCP for additional refills.     Cardiovascular: Calcium Channel Blockers 2 Failed - 06/04/2022 12:22 PM      Failed - Last BP in normal range    BP Readings from Last 1 Encounters:  04/27/22 (!) 175/69         Failed - Valid encounter within last 6 months    Recent Outpatient Visits           8 months ago Chronic obstructive pulmonary disease, unspecified COPD type [facility])   [place] [facility] [place], [place], [place]   1 year ago Type 2 diabetes mellitus with complication, without long-term current use of insulin [facility])   [place] [facility] [place], Shea Stakes, NP   1 year ago Essential hypertension   [place] [facility] [place], Cornelius Moras, RPH-CPP   1 year ago Essential hypertension   [place] [facility] [place], Shea Stakes, NP   1 year ago Essential hypertension   [place] [facility] [place], [place] W, NP              Passed - Last Heart Rate in normal range    Pulse Readings from Last 1 Encounters:  04/27/22 65

## 2022-06-17 ENCOUNTER — Other Ambulatory Visit: Payer: Self-pay | Admitting: Hematology and Oncology

## 2022-06-20 ENCOUNTER — Other Ambulatory Visit: Payer: Self-pay | Admitting: Hematology and Oncology

## 2022-06-22 ENCOUNTER — Other Ambulatory Visit: Payer: Self-pay | Admitting: Family Medicine

## 2022-06-22 DIAGNOSIS — E119 Type 2 diabetes mellitus without complications: Secondary | ICD-10-CM

## 2022-06-22 NOTE — Telephone Encounter (Signed)
Requested medications are due for refill today.  yes  Requested medications are on the active medications list.  yes  Last refill. 03/28/2022 15mL 0 rf  Future visit scheduled.   yes  Notes to clinic.  [place] listed as PCP. Labs are expired.  Pt is more than 3 months overdue for an OV.   Requested Prescriptions  Pending Prescriptions Disp Refills   LANTUS SOLOSTAR 100 UNIT/ML Solostar Pen [Pharmacy Med Name: Lantus Solostar U-100 Insulin 100 unit/mL (3 mL) subcutaneous pen] 15 mL 2    Sig: INJECT 5 UNITS INTO THE SKIN AT BEDTIME (discard each pen 28 DAYS AFTER first USE)     Endocrinology:  Diabetes - Insulins Failed - 06/22/2022  8:01 AM      Failed - HBA1C is between 0 and 7.9 and within 180 days    Hgb A1c MFr Bld  Date Value Ref Range Status  02/08/2021 5.9 (H) 4.8 - 5.6 % Final    Comment:    (NOTE) Pre diabetes:          5.7%-6.4%  Diabetes:              >6.4%  Glycemic control for   <7.0% adults with diabetes          Failed - Valid encounter within last 6 months    Recent Outpatient Visits           9 months ago Chronic obstructive pulmonary disease, unspecified COPD type [facility])   [place] [facility] [place], [place], [place]   1 year ago Type 2 diabetes mellitus with complication, without long-term current use of insulin [facility])   [place] [facility] [place], Shea Stakes, NP   1 year ago Essential hypertension   [place] [facility] [place], Cornelius Moras, RPH-CPP   1 year ago Essential hypertension   [place] [facility] [place], Shea Stakes, NP   1 year ago Essential hypertension   [place] [facility] [place], Shea Stakes, NP

## 2022-07-03 ENCOUNTER — Other Ambulatory Visit: Payer: Self-pay | Admitting: Family Medicine

## 2022-07-03 DIAGNOSIS — I10 Essential (primary) hypertension: Secondary | ICD-10-CM

## 2022-07-30 NOTE — Progress Notes (Signed)
[facility] - Clinic Note  08/01/2022   CHIEF COMPLAINT Patient presents for Retina Evaluation  HISTORY OF PRESENT ILLNESS: [name] is a 72 y.o. female who presents to the clinic today for:  HPI     Retina Evaluation   In both eyes.  This started 3 weeks ago.  Duration of 3 weeks.  Context:  near vision.        Comments   Retina eval per Dr Melina Fiddler for diabetic eye exam pt is reporting she has little trouble seeing at near pt does see floaters at times but denies any flashes of light she is currently seeing Dr Dione Booze with her next app in July pt last reading and A1C is unknown       Last edited by Etheleen Mayhew, COT on 08/01/2022  1:34 PM.     Referring physician: Vladimir Crofts, FNP [address] [place],  [place] [address]  HISTORICAL INFORMATION:  Selected notes from the MEDICAL RECORD NUMBER Referred by Dr. Nedra Hai:  Ocular Hx- PMH-   CURRENT MEDICATIONS: Current Outpatient Medications (Ophthalmic Drugs)  Medication Sig   cromolyn (OPTICROM) 4 % ophthalmic solution 1 drop 4 (four) times daily.   No current facility-administered medications for this visit. (Ophthalmic Drugs)   Current Outpatient Medications (Other)  Medication Sig   albuterol (PROVENTIL) (2.5 MG/3ML) 0.083% nebulizer solution Take 3 mLs (2.5 mg total) by nebulization every 6 (six) hours as needed for wheezing or shortness of breath.   albuterol (VENTOLIN HFA) 108 (90 Base) MCG/ACT inhaler Inhale 1-2 puffs into the lungs every 6 (six) hours as needed for wheezing or shortness of breath.   amLODipine (NORVASC) 10 MG tablet Take 1 tablet (10 mg total) by mouth daily. Please make an appt with your PCP for additional refills.   anastrozole (ARIMIDEX) 1 MG tablet TAKE ONE TABLET BY MOUTH ONCE DAILY   aspirin 81 MG EC tablet Take 1 tablet (81 mg total) by mouth daily.   atorvastatin (LIPITOR) 20 MG tablet Take 20 mg by mouth daily.   benzonatate (TESSALON) 100 MG  capsule Take 1 capsule (100 mg total) by mouth 3 (three) times daily as needed.   Blood Pressure Monitor DEVI Please provide patient with insurance approved blood pressure monitor. I10.0   ciprofloxacin (CIPRO) 500 MG tablet Take 500 mg by mouth 2 (two) times daily.   diclofenac Sodium (VOLTAREN) 1 % GEL Apply 1 Application topically 4 (four) times daily.   doxycycline (VIBRA-TABS) 100 MG tablet Take 1 tablet (100 mg total) by mouth 2 (two) times daily.   escitalopram (LEXAPRO) 10 MG tablet Take 1 tablet (10 mg total) by mouth daily. DEPRESSION   fluticasone (FLONASE) 50 MCG/ACT nasal spray Place 2 sprays into both nostrils daily.   fluticasone furoate-vilanterol (BREO ELLIPTA) 200-25 MCG/ACT AEPB Inhale 1 puff into the lungs daily. Must have office visit for refills   gabapentin (NEURONTIN) 100 MG capsule Take 100 mg by mouth 3 (three) times daily.   Insulin Pen Needle (EASY TOUCH PEN NEEDLES) 31G X 8 MM MISC Use to inject insulin once daily and Victoza once daily. Max of 2 pen needles a day. Must have office visit for refills   LANTUS SOLOSTAR 100 UNIT/ML Solostar Pen Inject 5 Units into the skin at bedtime. Must have office visit for refills   lidocaine (LIDODERM) 5 % Place 1 patch onto the skin daily. Remove & Discard patch within 12 hours or as directed  by MD   liraglutide (VICTOZA) 18 MG/3ML SOPN Inject 1.8 mg into the skin daily. Must have office visit for refills   loperamide (IMODIUM A-D) 2 MG tablet Take 2 mg by mouth.   losartan (COZAAR) 50 MG tablet TAKE ONE TABLET BY MOUTH DAILY. STOP 25mg  DOSE   metoprolol succinate (TOPROL-XL) 50 MG 24 hr tablet Take 1 tablet (50 mg total) by mouth daily. Must have office visit for refills   Misc. Devices MISC Please provide patient with nebulizer mask and tubing. [id number]   Multiple Vitamin (MULTIVITAMIN WITH MINERALS) TABS tablet Take 1 tablet by mouth daily. Sentry   nitroGLYCERIN (NITROSTAT) 0.4 MG SL tablet Place 1 tablet (0.4 mg total) under  the tongue every 5 (five) minutes as needed for chest pain.   Omega-3 Fatty Acids (FISH OIL) 1000 MG CAPS Take 1,000 mg by mouth daily.   pantoprazole (PROTONIX) 40 MG tablet TAKE ONE TABLET BY MOUTH EVERY DAY FOR ACID REFLUX   Respiratory Therapy Supplies (NEBULIZER MASK ADULT) MISC 1 Units by Does not apply route daily as needed. ICD 10 J44.9   SYMBICORT 160-4.5 MCG/ACT inhaler 1 puff 2 (two) times daily as needed.   No current facility-administered medications for this visit. (Other)   REVIEW OF SYSTEMS: ROS   Positive for: Endocrine, Cardiovascular, Eyes, Allergic/Imm Last edited by Etheleen Mayhew, COT on 08/01/2022  1:34 PM.     ALLERGIES No Known Allergies PAST MEDICAL HISTORY Past Medical History:  Diagnosis Date   Ambulates with cane    straight   Angina    no current problems per patient at PAT appt 11/05/18, nitroglycerin sl 06/30/20   Arthritis    HANDS"   Asthma    Breast cancer (HCC)    Cancer (HCC) 09/04/2017   Right Breast   Carpal tunnel syndrome, bilateral 11/26/2016   Cholelithiasis    Cocaine abuse (HCC) 07/2012   per [place] drug screen   COPD (chronic obstructive pulmonary disease) (HCC)    per patient   Coronary artery disease    Diabetes mellitus without mention of complication    type 2   Dysphagia    esophageal dysmotility on 03/2011 esophagram    Fatty liver disease, nonalcoholic 2009   on Imaging.    GERD (gastroesophageal reflux disease)    Headache    Hearing loss    bilateral - no hearing aids   History of colonic polyps 2009   adenomatous 2009, HP in 2009, 2011, 2013.    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   Wears dentures    full upper and partial lower   Wears glasses    Past Surgical History:  Procedure Laterality Date   ABDOMINAL ANGIOGRAM  02/20/2011   Procedure:  ABDOMINAL ANGIOGRAM;  Surgeon: Robynn Pane, MD;  [facility] C[facility] LAB;  Service: Cardiovascular;;   BREAST BIOPSY Left    CARPAL TUNNEL RELEASE Right 07/04/2017   Procedure: RIGHT CARPAL TUNNEL RELEASE;  Surgeon: Betha Loa, MD;  Location: [facility];  Service: Orthopedics;  Laterality: Right;   CARPAL TUNNEL RELEASE Left 09/12/2017   Procedure: LEFT CARPAL TUNNEL RELEASE;  Surgeon: Betha Loa, MD;  Location: [facility];  Service: Orthopedics;  Laterality: Left;   CATARACT EXTRACTION     CHOLECYSTECTOMY N/A 11/11/2018  Procedure: ATTEMPTED LAPAROSCOPIC CHOLECUSTECOMY,  OPEN CHOLECYSTECTOMY;  Surgeon: Abigail Miyamoto, MD;  [facility] OR;  Service: General;  Laterality: N/A;   COLONOSCOPY  11/30/2011   Procedure: COLONOSCOPY;  Surgeon: Hart Carwin, MD;  Location: [facility] END[facility];  Service: Endoscopy;  Laterality: N/A;   CORONARY ANGIOPLASTY WITH STENT PLACEMENT  02/20/2011   ERCP N/A 10/03/2018   Procedure: ENDOSCOPIC RETROGRADE CHOLANGIOPANCREATOGRAPHY (ERCP);  Surgeon: Meryl Dare, MD;  Location: [facility];  Service: Endoscopy;  Laterality: N/A;   ESOPHAGOGASTRODUODENOSCOPY  11/30/2011   Procedure: ESOPHAGOGASTRODUODENOSCOPY (EGD);  Surgeon: Hart Carwin, MD;  Location: [facility];  Service: Endoscopy;  Laterality: N/A;   ESOPHAGOGASTRODUODENOSCOPY N/A 02/23/2015   Procedure: ESOPHAGOGASTRODUODENOSCOPY (EGD);  Surgeon: Iva Boop, MD;  [facility] ENDOSCOPY;  Service: Endoscopy;  Laterality: N/A;   EYE SURGERY Bilateral    cataracts removed   INTRAOPERATIVE CHOLANGIOGRAM N/A 11/11/2018   Procedure: Intraoperative Cholangiogram;  Surgeon: Abigail Miyamoto, MD;  Location: [facility] OR;  Service: General;  Laterality: N/A;   IR REMOVAL TUN ACCESS W/ PORT W/O FL MOD SED  06/30/2020   LEFT HEART CATHETERIZATION WITH CORONARY ANGIOGRAM N/A 02/20/2011   Procedure: LEFT HEART CATHETERIZATION WITH CORONARY ANGIOGRAM;  Surgeon: Robynn Pane, MD;   Location: [facility] CATH LAB;  Service: Cardiovascular;  Laterality: N/A;   MASTECTOMY Right 11/05/2017   MASTECTOMY W/ SENTINEL NODE BIOPSY Right 11/27/2017   MASTECTOMY W/ SENTINEL NODE BIOPSY Right 11/27/2017   Procedure: RIGHT TOTAL MASTECTOMY WITH SENTINEL LYMPH NODE BIOPSY;  Surgeon: Glenna Fellows, MD;  Location: [facility] OR;  Service: General;  Laterality: Right;   PORT A CATH REVISION Left 05/07/2018   Procedure: PORT A CATH REVISION;  Surgeon: Glenna Fellows, MD;  Location: [facility] ORS;  Service: General;  Laterality: Left;   PORTACATH PLACEMENT Left 11/27/2017   Procedure: INSERTION PORT-A-CATH;  Surgeon: Glenna Fellows, MD;  Location: [facility] OR;  Service: General;  Laterality: Left;   REVERSE SHOULDER ARTHROPLASTY Left 03/09/2021   Procedure: REVERSE SHOULDER ARTHROPLASTY;  Surgeon: Francena Hanly, MD;  Location: [facility]S;  Service: Orthopedics;  Laterality: Left;   RIGHT COLECTOMY  02/2007   for post polypectomy colonic perforation   SPHINCTEROTOMY  10/03/2018   Procedure: SPHINCTEROTOMY;  Surgeon: Meryl Dare, MD;  Location: [facility];  Service: Endoscopy;;   TUBAL LIGATION     FAMILY HISTORY Family History  Problem Relation Age of Onset   Heart disease Mother    Diabetes Mother    Cancer Father        unsure what kind   Diabetes Sister    Colon cancer Neg Hx    Breast cancer Neg Hx    SOCIAL HISTORY Social History   Tobacco Use   Smoking status: Former    Packs/day: 0.25    Years: 47.00    Additional pack years: 0.00    Total pack years: 11.75    Types: Cigarettes    Quit date: 05/09/2010    Years since quitting: 12.2   Smokeless tobacco: Never  Vaping Use   Vaping Use: Never used  Substance Use Topics   Alcohol use: Not Currently   Drug use: Not Currently    Types: Cocaine    Comment: positive drug screen (cocaine, benzo's)07/2012 in emergency room       OPHTHALMIC EXAM:  Base Eye Exam     Visual Acuity (Snellen - Linear)       Right Left   Dist cc  20/25 -1 20/20   Dist ph cc NI  Correction: Glasses         Tonometry (Tonopen, 1:42 PM)       Right Left   Pressure 15 16         Pupils       Pupils Dark Light Shape React APD   Right PERRL 3 2 Round Sluggish None   Left PERRL 3 2 Round Sluggish None         Visual Fields       Left Right    Full Full         Extraocular Movement       Right Left    Full, Ortho Full, Ortho         Neuro/Psych     Oriented x3: Yes   Mood/Affect: Normal         Dilation     Both eyes: 2.5% Phenylephrine @ 1:42 PM           Slit Lamp and Fundus Exam     External Exam       Right Left   External Normal Normal         Slit Lamp Exam       Right Left   Lids/Lashes Normal Normal   Conjunctiva/Sclera White and quiet White and quiet   Cornea Clear Clear   Anterior Chamber Deep and quiet Deep and quiet   Iris Round and reactive Round and reactive   Lens Clear Clear   Anterior Vitreous Normal Normal         Fundus Exam       Right Left   Disc Normal Normal   Macula Normal Normal   Vessels Normal Normal   Periphery Normal Normal           IMAGING AND PROCEDURES  Imaging and Procedures for 08/01/2022        ASSESSMENT/PLAN:   ICD-10-CM   1. Retinal edema  H35.81 OCT, Retina - OU - Both Eyes    2. Encounter for long-term (current) use of insulin (HCC)  Z79.4      Diabetes mellitus, type 2 without retinopathy - last A1c 6.2 on 12/22/21 - BCVA  20/25 OD, 20/20 OS - The incidence, risk factors for progression, natural history and treatment options for diabetic retinopathy  were discussed with patient.   - The need for close monitoring of blood glucose, blood pressure, and serum lipids, avoiding cigarette or any type of tobacco, and the need for long term follow up was also discussed with patient. - OCT shows rare drusen OU, partial PVD OS  - f/u with Dr. Dione Booze  2. Hypertensive retinopathy OU - discussed importance of tight BP  control - monitor   .3. Pseudophakia OU  - s/p CE/IOL w/ Dr. Dione Booze  - IOL in good position, doing well  - monitor     Ophthalmic Meds Ordered this visit:  No orders of the defined types were placed in this encounter.    No follow-ups on file.  There are no Patient Instructions on file for this visit.  Explained the diagnoses, plan, and follow up with the patient and they expressed understanding.  Patient expressed understanding of the importance of proper follow up care.   This document serves as a record of services personally performed by Karie Chimera, MD, PhD. It was created on their behalf by De Blanch, an ophthalmic technician. The creation of this record is the provider's dictation and/or activities during the visit.    Electronically signed  by: De Blanch, OA, 08/01/22  3:01 PM  This document serves as a record of services personally performed by Karie Chimera, MD, PhD. It was created on their behalf by Gerilyn Nestle, COT an ophthalmic technician. The creation of this record is the provider's dictation and/or activities during the visit.    Electronically signed by:  Charlette Caffey, COT  08/01/22 3:02 PM  Karie Chimera, M.D., Ph.D. Diseases & Surgery of the Retina and Vitreous [facility] 08/01/2022  Abbreviations: M myopia (nearsighted); A astigmatism; H hyperopia (farsighted); P presbyopia; Mrx spectacle prescription;  CTL contact lenses; OD right eye; OS left eye; OU both eyes  XT exotropia; ET esotropia; PEK punctate epithelial keratitis; PEE punctate epithelial erosions; DES dry eye syndrome; MGD meibomian gland dysfunction; ATs artificial tears; PFAT's preservative free artificial tears; NSC nuclear sclerotic cataract; PSC posterior subcapsular cataract; ERM epi-retinal membrane; PVD posterior vitreous detachment; RD retinal detachment; DM diabetes mellitus; DR diabetic retinopathy; NPDR non-proliferative diabetic  retinopathy; PDR proliferative diabetic retinopathy; CSME clinically significant macular edema; DME diabetic macular edema; dbh dot blot hemorrhages; CWS cotton wool spot; POAG primary open angle glaucoma; C/D cup-to-disc ratio; HVF humphrey visual field; GVF goldmann visual field; OCT optical coherence tomography; IOP intraocular pressure; BRVO Branch retinal vein occlusion; CRVO central retinal vein occlusion; CRAO central retinal artery occlusion; BRAO branch retinal artery occlusion; RT retinal tear; SB scleral buckle; PPV pars plana vitrectomy; VH Vitreous hemorrhage; PRP panretinal laser photocoagulation; IVK intravitreal kenalog; VMT vitreomacular traction; MH Macular hole;  NVD neovascularization of the disc; NVE neovascularization elsewhere; AREDS age related eye disease study; ARMD age related macular degeneration; POAG primary open angle glaucoma; EBMD epithelial/anterior basement membrane dystrophy; ACIOL anterior chamber intraocular lens; IOL intraocular lens; PCIOL posterior chamber intraocular lens; Phaco/IOL phacoemulsification with intraocular lens placement; PRK photorefractive keratectomy; LASIK laser assisted in situ keratomileusis; HTN hypertension; DM diabetes mellitus; COPD chronic obstructive pulmonary disease

## 2022-08-01 ENCOUNTER — Ambulatory Visit (INDEPENDENT_AMBULATORY_CARE_PROVIDER_SITE_OTHER): Payer: 59 | Admitting: Ophthalmology

## 2022-08-01 ENCOUNTER — Encounter (INDEPENDENT_AMBULATORY_CARE_PROVIDER_SITE_OTHER): Payer: Self-pay | Admitting: Ophthalmology

## 2022-08-01 DIAGNOSIS — H3581 Retinal edema: Secondary | ICD-10-CM

## 2022-08-01 DIAGNOSIS — I10 Essential (primary) hypertension: Secondary | ICD-10-CM

## 2022-08-01 DIAGNOSIS — Z794 Long term (current) use of insulin: Secondary | ICD-10-CM | POA: Diagnosis not present

## 2022-08-01 DIAGNOSIS — E119 Type 2 diabetes mellitus without complications: Secondary | ICD-10-CM | POA: Diagnosis not present

## 2022-08-01 DIAGNOSIS — Z961 Presence of intraocular lens: Secondary | ICD-10-CM

## 2022-08-01 DIAGNOSIS — H35033 Hypertensive retinopathy, bilateral: Secondary | ICD-10-CM | POA: Diagnosis not present

## 2022-08-02 ENCOUNTER — Encounter (INDEPENDENT_AMBULATORY_CARE_PROVIDER_SITE_OTHER): Payer: Self-pay | Admitting: Ophthalmology

## 2022-09-07 ENCOUNTER — Telehealth: Payer: Self-pay | Admitting: Hematology and Oncology

## 2022-09-07 NOTE — Telephone Encounter (Signed)
Rescheduled appointments per provider PAL. Patient is aware of the changes made to her upcoming appointment.  

## 2022-09-18 IMAGING — MG DIGITAL SCREENING UNILAT LEFT W/ TOMO W/ CAD
6 series · 6 of 18 positions shown · non-contrast
Comparison: Previous exam(s).

CLINICAL DATA: Screening.

EXAM:
DIGITAL SCREENING UNILATERAL LEFT MAMMOGRAM WITH CAD AND TOMO

[L MLO synth-2D (1 of 2)]
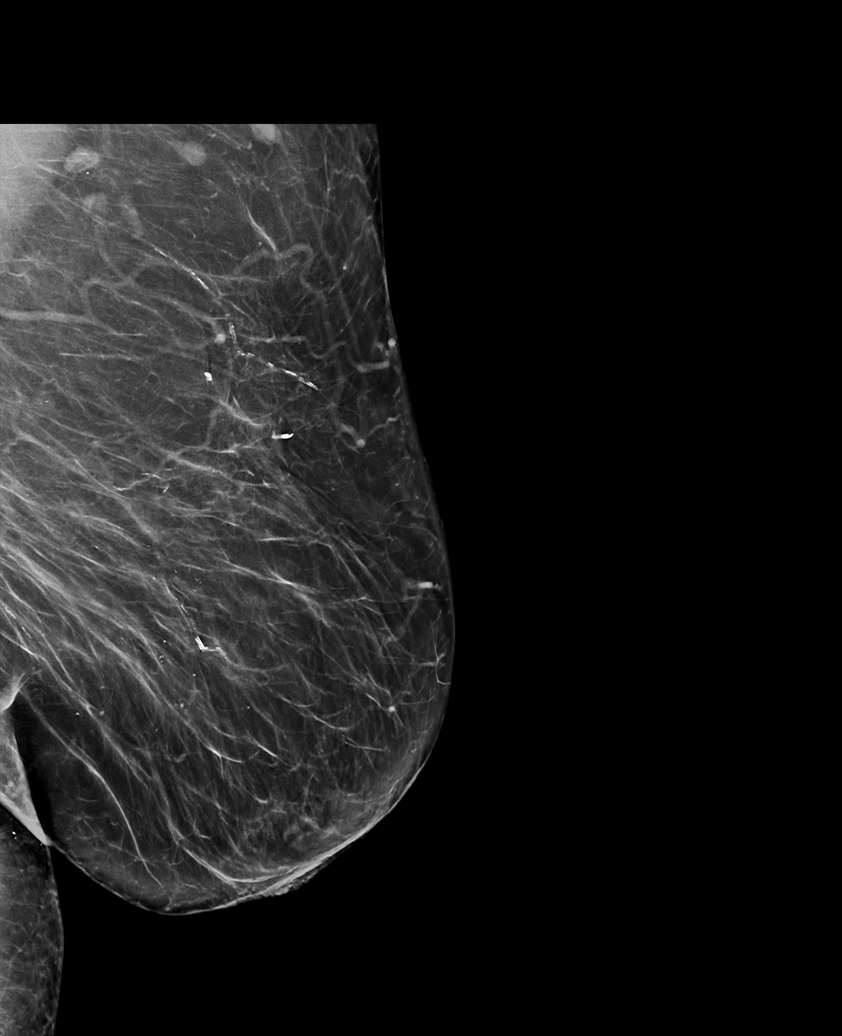

[L CC synth-2D]
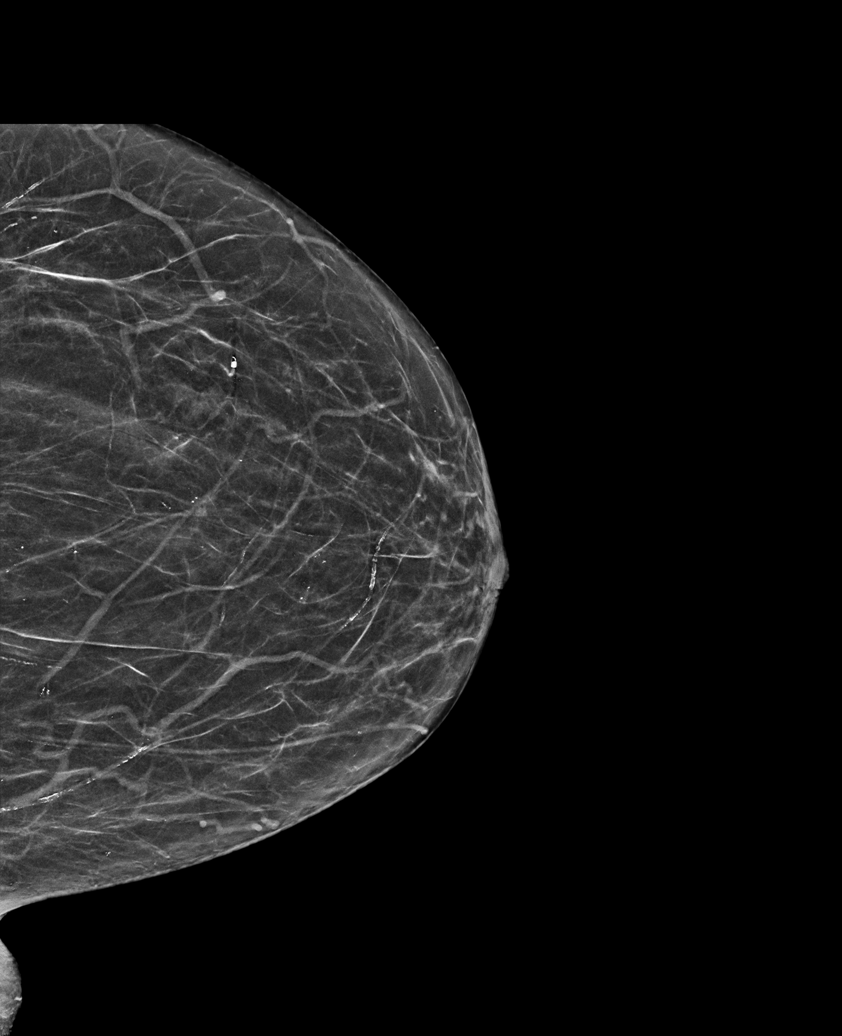

[L MLO synth-2D (2 of 2)]
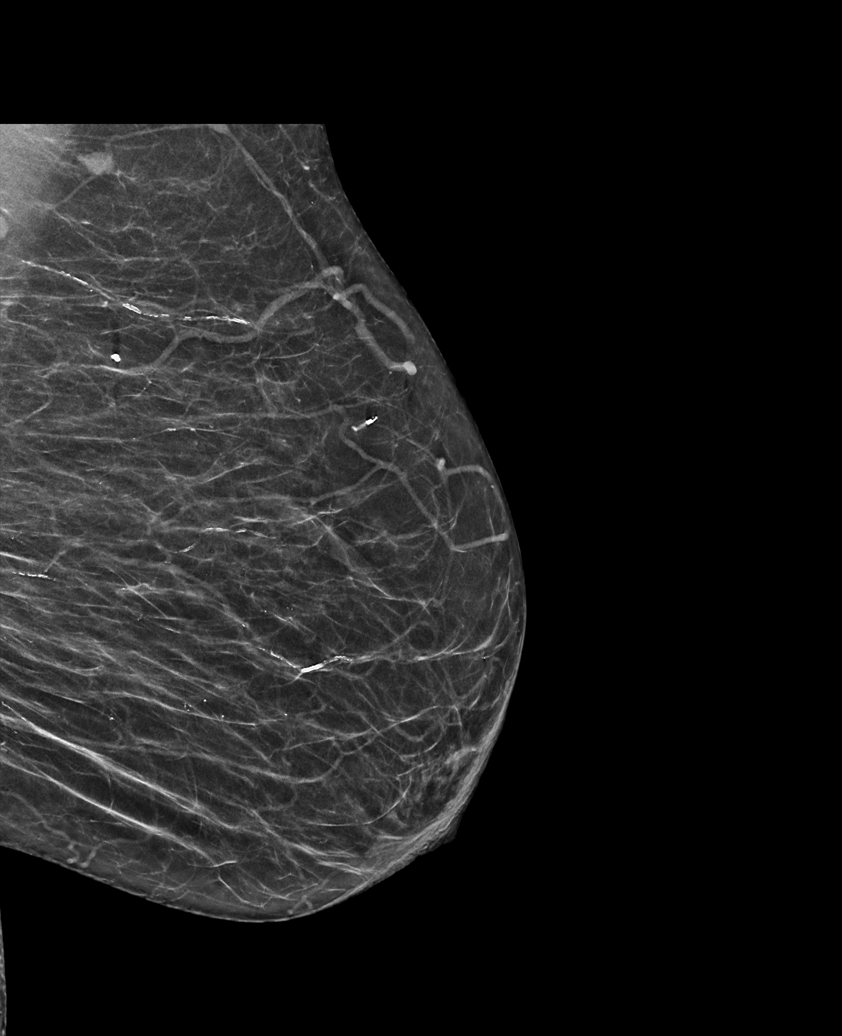

[L MLO tomo (1 of 2) · tomo slice 29/57.0]
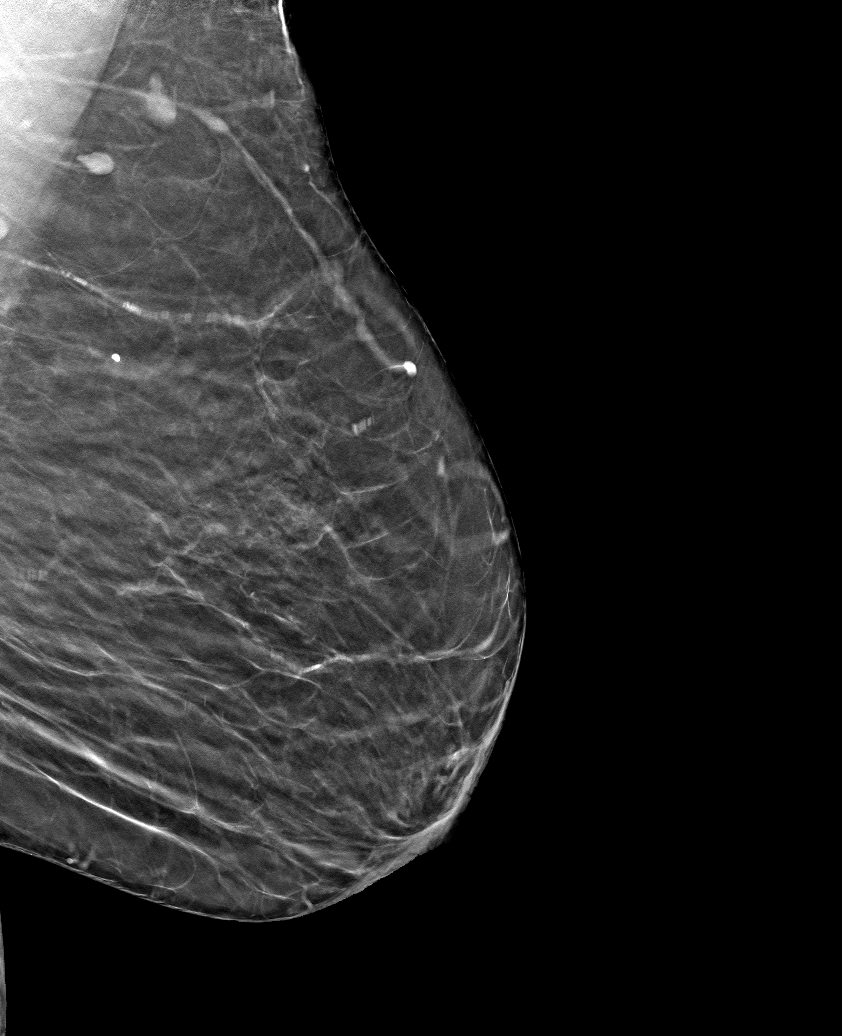

[L MLO tomo (2 of 2) · tomo slice 39/76.0]
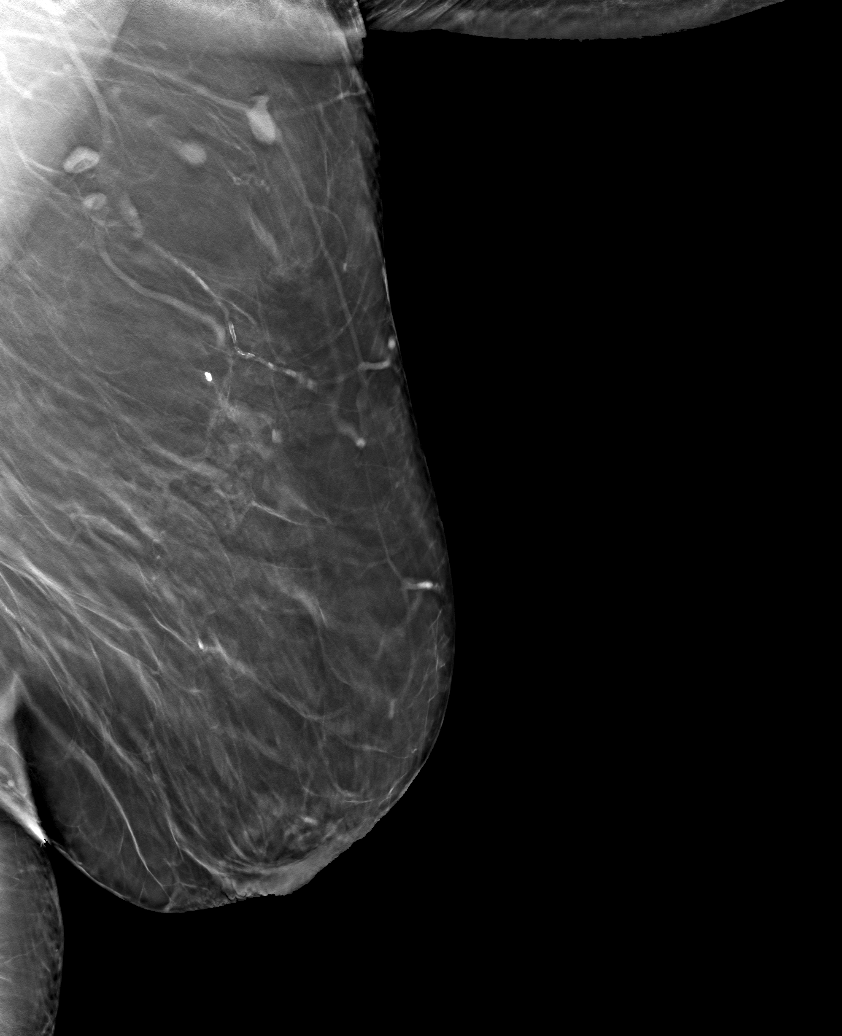

[L CC tomo · tomo slice 27/53.0]
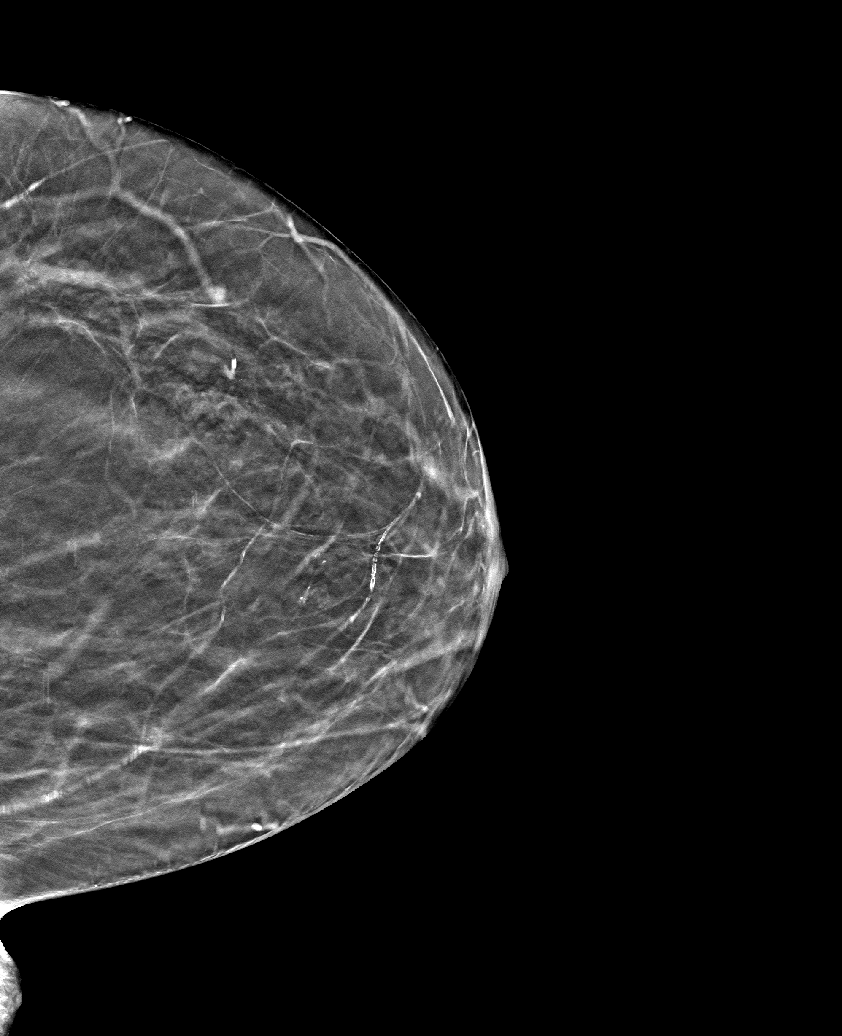

[6 of 18 positions shown; findings below may reference images not displayed]

ACR Breast Density Category b: There are scattered areas of
fibroglandular density.
FINDINGS: There are no findings suspicious for malignancy. Images were
processed with CAD.
IMPRESSION: No mammographic evidence of malignancy. A result letter of this
screening mammogram will be mailed directly to the patient.

RECOMMENDATION:
Screening mammogram in one year. (Code:[id number])

BI-RADS CATEGORY  1: Negative.

## 2022-09-21 ENCOUNTER — Inpatient Hospital Stay: Payer: 59 | Attending: Adult Health

## 2022-09-28 ENCOUNTER — Inpatient Hospital Stay: Payer: 59 | Admitting: Adult Health

## 2022-09-28 ENCOUNTER — Telehealth: Payer: 59 | Admitting: Hematology and Oncology

## 2022-09-28 DIAGNOSIS — Z170 Estrogen receptor positive status [ER+]: Secondary | ICD-10-CM

## 2022-09-28 DIAGNOSIS — C50411 Malignant neoplasm of upper-outer quadrant of right female breast: Secondary | ICD-10-CM

## 2022-09-28 NOTE — Progress Notes (Signed)
Patient did not show up for labs therefore I called her and told her that we should reschedule today's visit.  She verbalized understanding and I sent a message to scheduling to get a lab in 1 week and a follow-up over the phone in 2.

## 2022-10-10 ENCOUNTER — Telehealth: Payer: Self-pay | Admitting: *Deleted

## 2022-10-10 NOTE — Telephone Encounter (Signed)
This RN spoke with pt per her call stating she is still waiting for a call regarding a new appt per 8/23 cancellation.  This RN scheduled pt while on the phone due to need for coordination with other appts and arranging transportation.

## 2022-10-22 ENCOUNTER — Inpatient Hospital Stay: Payer: 59 | Attending: Adult Health | Admitting: Adult Health

## 2022-10-22 ENCOUNTER — Inpatient Hospital Stay: Payer: 59

## 2022-10-22 ENCOUNTER — Encounter: Payer: Self-pay | Admitting: Adult Health

## 2022-10-22 VITALS — BP 152/53 | HR 75 | Temp 97.8°F | Resp 18 | Ht 59.0 in | Wt 128.0 lb

## 2022-10-22 DIAGNOSIS — D472 Monoclonal gammopathy: Secondary | ICD-10-CM | POA: Insufficient documentation

## 2022-10-22 DIAGNOSIS — Z79811 Long term (current) use of aromatase inhibitors: Secondary | ICD-10-CM | POA: Diagnosis not present

## 2022-10-22 DIAGNOSIS — C50411 Malignant neoplasm of upper-outer quadrant of right female breast: Secondary | ICD-10-CM | POA: Insufficient documentation

## 2022-10-22 DIAGNOSIS — R0789 Other chest pain: Secondary | ICD-10-CM

## 2022-10-22 DIAGNOSIS — Z170 Estrogen receptor positive status [ER+]: Secondary | ICD-10-CM | POA: Insufficient documentation

## 2022-10-22 DIAGNOSIS — M85851 Other specified disorders of bone density and structure, right thigh: Secondary | ICD-10-CM | POA: Diagnosis not present

## 2022-10-22 DIAGNOSIS — Z9011 Acquired absence of right breast and nipple: Secondary | ICD-10-CM | POA: Insufficient documentation

## 2022-10-22 LAB — CBC WITH DIFFERENTIAL (CANCER CENTER ONLY)
Abs Immature Granulocytes: 0.02 10*3/uL (ref 0.00–0.07)
Basophils Absolute: 0 10*3/uL (ref 0.0–0.1)
Basophils Relative: 0 %
Eosinophils Absolute: 0.4 10*3/uL (ref 0.0–0.5)
Eosinophils Relative: 7 %
HCT: 32.9 % — ABNORMAL LOW (ref 36.0–46.0)
Hemoglobin: 10 g/dL — ABNORMAL LOW (ref 12.0–15.0)
Immature Granulocytes: 0 %
Lymphocytes Relative: 31 %
Lymphs Abs: 1.8 10*3/uL (ref 0.7–4.0)
MCH: 29.9 pg (ref 26.0–34.0)
MCHC: 30.4 g/dL (ref 30.0–36.0)
MCV: 98.5 fL (ref 80.0–100.0)
Monocytes Absolute: 0.4 10*3/uL (ref 0.1–1.0)
Monocytes Relative: 6 %
Neutro Abs: 3.2 10*3/uL (ref 1.7–7.7)
Neutrophils Relative %: 56 %
Platelet Count: 257 10*3/uL (ref 150–400)
RBC: 3.34 MIL/uL — ABNORMAL LOW (ref 3.87–5.11)
RDW: 14.5 % (ref 11.5–15.5)
WBC Count: 5.8 10*3/uL (ref 4.0–10.5)
nRBC: 0 % (ref 0.0–0.2)

## 2022-10-22 LAB — CMP (CANCER CENTER ONLY)
ALT: 9 U/L (ref 0–44)
AST: 14 U/L — ABNORMAL LOW (ref 15–41)
Albumin: 4.1 g/dL (ref 3.5–5.0)
Alkaline Phosphatase: 135 U/L — ABNORMAL HIGH (ref 38–126)
Anion gap: 7 (ref 5–15)
BUN: 35 mg/dL — ABNORMAL HIGH (ref 8–23)
CO2: 22 mmol/L (ref 22–32)
Calcium: 9.2 mg/dL (ref 8.9–10.3)
Chloride: 110 mmol/L (ref 98–111)
Creatinine: 2.79 mg/dL — ABNORMAL HIGH (ref 0.44–1.00)
GFR, Estimated: 17 mL/min — ABNORMAL LOW (ref >60)
Glucose, Bld: 131 mg/dL — ABNORMAL HIGH (ref 70–99)
Potassium: 3.5 mmol/L (ref 3.5–5.1)
Sodium: 139 mmol/L (ref 135–145)
Total Bilirubin: 0.5 mg/dL (ref 0.3–1.2)
Total Protein: 8.2 g/dL — ABNORMAL HIGH (ref 6.5–8.1)

## 2022-10-22 NOTE — Progress Notes (Signed)
[facility] Cancer Follow up:    Roswell Miners, MD [address] [place] [address]   DIAGNOSIS:  Cancer Staging  Malignant neoplasm of upper-outer quadrant of right breast in female, estrogen receptor positive (HCC) Staging form: Breast, AJCC 8th Edition - Clinical stage from 09/03/2017: Stage IB (cT2, cN0, cM0, G3, ER+, PR+, HER2+) - Signed by Loa Socks, NP on 04/10/2018 Histologic grading system: 3 grade system - Pathologic stage from 11/27/2017: Stage IA (pT1c, pN0, cM0, G2, ER+, PR+, HER2+) - Signed by Loa Socks, NP on 04/10/2018 Histologic grading system: 3 grade system   SUMMARY OF ONCOLOGIC HISTORY: Oncology History  Malignant neoplasm of upper-outer quadrant of right breast in female, estrogen receptor positive (HCC)  09/03/2017 Initial Diagnosis   Screening mammogram detected 1.4 cm right breast mass at 10 o'clock position, additional pleomorphic calcifications 2 cm posteriorly, additional loosely grouped calcifications 10.1 x 7.1 x 5.3 cm, single right axillary lymph node: Biopsy revealed DCIS ER 100%, PR 50%; IDC grade 2-3 ER 100%, PR 50%, Ki-67 15%, HER-2 positive ratio 2, copy #5, lymph node negative T2 N0 stage Ia clinical stage AJCC 8   09/03/2017 Cancer Staging   Staging form: Breast, AJCC 8th Edition - Clinical stage from 09/03/2017: Stage IB (cT2, cN0, cM0, G3, ER+, PR+, HER2+)   11/27/2017 Surgery   Right mastectomy ([facility]) ([id number]): IDC grade 2, 1.6 cm, separate foci of DCIS intermediate grade, margins negative, 1/5 LN positive for micrometasatic carcinoma. ER 100%, PR 50%, Ki-67 15%, HER-2 positive ratio 2, copy #5 T1c N0 stage Ia   11/27/2017 Cancer Staging   Staging form: Breast, AJCC 8th Edition - Pathologic stage from 11/27/2017: Stage IA (pT1c, pN0, cM0, G2, ER+, PR+, HER2+)     02/07/2018 - 03/12/2019 Chemotherapy   trastuzumab (HERCEPTIN) 483 mg in sodium chloride 0.9 % 250 mL chemo infusion, 8 mg/kg = 483 mg,  Intravenous,  Once, 11 of 11 cycles. Administration: 483 mg (02/07/2018), 357 mg (02/27/2018), 357 mg (05/01/2018), 357 mg (05/23/2018), 357 mg (06/12/2018), 357 mg (07/03/2018), 357 mg (08/14/2018), 357 mg (09/04/2018), 357 mg (10/17/2018), 357 mg (03/20/2018), 357 mg (04/10/2018)  trastuzumab-anns (KANJINTI) 357 mg in sodium chloride 0.9 % 250 mL chemo infusion, 6 mg/kg = 357 mg (100 % of original dose 6 mg/kg), Intravenous,  Once, 7 of 7 cycles. Dose modification: 6 mg/kg (original dose 6 mg/kg, Cycle 12, Reason: Other (see comments)). Administration: 357 mg (11/06/2018), 357 mg (11/27/2018), 357 mg (12/18/2018), 357 mg (01/08/2019), 357 mg (01/29/2019), 357 mg (02/19/2019), 357 mg (03/12/2019).   12/2018 - 12/2023 Anti-estrogen oral therapy   Anastrozole     CURRENT THERAPY: Anastrozole  INTERVAL HISTORY: [name] 72 y.o. female returns for follow-up of her history of breast cancer.  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  Her most recent mammogram occurred on March 12, 2022 demonstrating no mammographic evidence of malignancy and breast density category A.  She also underwent bone density testing that occurred November 02, 2021 that showed osteopenia with a T-score -1.2 in the right femur.  Of note today she reports some mild substernal chest pain that has been occurring for the past couple of days.  She has had this evaluated in the past in the emergency room and an acute coronary event was ruled out.  She tells me she normally takes pain medication in the ER and it resolves. She also has some inhalers that she can take when she goes home.  She reports a continued right chest wall tenderness that has been present for the past 4 weeks.  She wants me to look at her right mastectomy site today.    Patient Active Problem List   Diagnosis Date Noted   S/P reverse total shoulder arthroplasty, 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   MGUS (monoclonal gammopathy of unknown significance) 11/29/2020   ESRD  (end stage renal disease) (HCC) 03/15/2020   Insulin dependent type 2 diabetes mellitus (HCC) 12/09/2018   COPD (chronic obstructive pulmonary disease) (HCC) 12/09/2018   Chronic cholecystitis 11/11/2018   Hemobilia    Dilated bile duct    Jaundice    Elevated LFTs    Choledocholithiasis 10/02/2018   CKD (chronic kidney disease), stage III    Acute cholecystitis    Elevated liver enzymes 09/18/2018   Calculus of gallbladder and bile duct with acute cholecystitis, with obstruction    Melena    Unstable angina (HCC) 07/12/2018   Port-A-Cath in place 02/27/2018   Cancer of overlapping sites of right breast (HCC) 11/27/2017   Malignant neoplasm of upper-outer quadrant of right breast in female, estrogen receptor positive (HCC) 10/03/2017   Carpal tunnel syndrome, bilateral 11/26/2016   Ulnar neuropathy at elbow, 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   Abdominal pain, epigastric    Acute on chronic renal failure (HCC) 02/19/2015   Diarrhea    Nausea with vomiting    Hyperglycemia 04/01/2014   Hyperosmolar non-ketotic state in patient with type 2 diabetes mellitus (HCC) 04/01/2014   Nausea vomiting and diarrhea 04/01/2014   CAD (coronary artery disease) 04/01/2014   Dehydration    Diabetic ketoacidosis without coma associated with diabetes mellitus due to underlying condition (HCC)    High anion gap metabolic acidosis    Cocaine abuse (HCC)    Toxic encephalopathy-Unlikely metabolic,?psychogenic 08/02/2012   Bereavement 02/02/2012   Depression 02/02/2012   Iron deficiency anemia, unspecified 11/30/2011   Nonspecific abnormal finding in stool contents 11/30/2011   Benign neoplasm of colon 11/30/2011   Cough 05/14/2011   Chest pain 03/19/2011   Macrocytic anemia 03/19/2011   Type 2 diabetes mellitus with complication, without long-term current use of insulin (HCC) 03/19/2011   DOE (dyspnea on exertion) 03/19/2011   Chest pain at rest 02/18/2011    Class: Acute   Cholelithiasis 11/01/2010   DM  04/08/2009   HYPERLIPIDEMIA 04/08/2009   HTN (hypertension) 04/08/2009   MYOCARDIAL INFARCTION 04/08/2009   Coronary atherosclerosis 04/08/2009   GERD 04/08/2009   FATTY LIVER DISEASE 04/08/2009   COLONIC POLYPS, HX OF 04/08/2009    has No Known Allergies.  MEDICAL HISTORY: Past Medical History:  Diagnosis Date   Ambulates with cane    straight   Angina    no current problems per patient at PAT appt 11/05/18, nitroglycerin sl 06/30/20   Arthritis    HANDS"   Asthma    Breast cancer (HCC)    Cancer (HCC) 09/04/2017   Right Breast   Carpal tunnel syndrome, bilateral 11/26/2016   Cholelithiasis    Cocaine abuse (HCC) 07/2012   per E.R. drug screen   COPD (chronic obstructive pulmonary disease) (HCC)    per patient   Coronary artery disease    Diabetes mellitus without mention of complication    type 2   Dysphagia    esophageal dysmotility on 03/2011 esophagram    Fatty liver disease, nonalcoholic 2009   on Imaging.    GERD (gastroesophageal reflux disease)    Headache    Hearing loss    bilateral - no  hearing aids   History of colonic polyps 2009   adenomatous 2009, HP in 2009, 2011, 2013.    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   Wears dentures    full upper and partial lower   Wears glasses     SURGICAL HISTORY: Past Surgical History:  Procedure Laterality Date   ABDOMINAL ANGIOGRAM  02/20/2011   Procedure: ABDOMINAL ANGIOGRAM;  Surgeon: Robynn Pane, MD;  Location: [facility] CATH LAB;  Service: Cardiovascular;;   BREAST BIOPSY Left    CARPAL TUNNEL RELEASE Right 07/04/2017   Procedure: RIGHT CARPAL TUNNEL RELEASE;  Surgeon: Betha Loa, MD;  Location: [facility];  Service: Orthopedics;  Laterality: Right;   CARPAL TUNNEL RELEASE Left 09/12/2017   Procedure: LEFT CARPAL TUNNEL RELEASE;  Surgeon:  Betha Loa, MD;  Location: [facility];  Service: Orthopedics;  Laterality: Left;   CATARACT EXTRACTION     CHOLECYSTECTOMY N/A 11/11/2018   Procedure: ATTEMPTED LAPAROSCOPIC CHOLECUSTECOMY,  OPEN CHOLECYSTECTOMY;  Surgeon: Abigail Miyamoto, MD;  Location: [facility] OR;  Service: General;  Laterality: N/A;   COLONOSCOPY  11/30/2011   Procedure: COLONOSCOPY;  Surgeon: Hart Carwin, MD;  Location: [facility] END[facility]COPY;  Service: Endoscopy;  Laterality: N/A;   CORONARY ANGIOPLASTY WITH STENT PLACEMENT  02/20/2011   ERCP N/A 10/03/2018   Procedure: ENDOSCOPIC RETROGRADE CHOLANGIOPANCREATOGRAPHY (ERCP);  Surgeon: Meryl Dare, MD;  Location: [facility];  Service: Endoscopy;  Laterality: N/A;   ESOPHAGOGASTRODUODENOSCOPY  11/30/2011   Procedure: ESOPHAGOGASTRODUODENOSCOPY (EGD);  Surgeon: Hart Carwin, MD;  Location: [facility];  Service: Endoscopy;  Laterality: N/A;   ESOPHAGOGASTRODUODENOSCOPY N/A 02/23/2015   Procedure: ESOPHAGOGASTRODUODENOSCOPY (EGD);  Surgeon: Iva Boop, MD;  Location: [facility] ENDOSCOPY;  Service: Endoscopy;  Laterality: N/A;   EYE SURGERY Bilateral    cataracts removed   INTRAOPERATIVE CHOLANGIOGRAM N/A 11/11/2018   Procedure: Intraoperative Cholangiogram;  Surgeon: Abigail Miyamoto, MD;  Location: [facility] OR;  Service: General;  Laterality: N/A;   IR REMOVAL TUN ACCESS W/ PORT W/O FL MOD SED  06/30/2020   LEFT HEART CATHETERIZATION WITH CORONARY ANGIOGRAM N/A 02/20/2011   Procedure: LEFT HEART CATHETERIZATION WITH CORONARY ANGIOGRAM;  Surgeon: Robynn Pane, MD;  Location: [facility] CATH LAB;  Service: Cardiovascular;  Laterality: N/A;   MASTECTOMY Right 11/05/2017   MASTECTOMY W/ SENTINEL NODE BIOPSY Right 11/27/2017   MASTECTOMY W/ SENTINEL NODE BIOPSY Right 11/27/2017   Procedure: RIGHT TOTAL MASTECTOMY WITH SENTINEL LYMPH NODE BIOPSY;  Surgeon: Glenna Fellows, MD;  Location: [facility] OR;  Service: General;  Laterality: Right;   PORT A CATH REVISION Left 05/07/2018    Procedure: PORT A CATH REVISION;  Surgeon: Glenna Fellows, MD;  Location: [facility]S;  Service: General;  Laterality: Left;   PORTACATH PLACEMENT Left 11/27/2017   Procedure: INSERTION PORT-A-CATH;  Surgeon: Glenna Fellows, MD;  Location: [facility] OR;  Service: General;  Laterality: Left;   REVERSE SHOULDER ARTHROPLASTY Left 03/09/2021   Procedure: REVERSE SHOULDER ARTHROPLASTY;  Surgeon: Francena Hanly, MD;  Location: [facility];  Service: Orthopedics;  Laterality: Left;   RIGHT COLECTOMY  02/2007   for post polypectomy colonic perforation   SPHINCTEROTOMY  10/03/2018   Procedure: SPHINCTEROTOMY;  Surgeon: Meryl Dare, MD;  Location: [facility];  Service: Endoscopy;;   TUBAL LIGATION      SOCIAL HISTORY: Social History  Socioeconomic History   Marital status: Divorced    Spouse name: Not on file   Number of children: 2   Years of education: Not on file   Highest education level: Not on file  Occupational History   Occupation: retired. prev worked in housekeeping w/ [employer]  Tobacco Use   Smoking status: Former    Current packs/day: 0.00    Average packs/day: 0.3 packs/day for 47.0 years (11.8 ttl pk-yrs)    Types: Cigarettes    Start date: 05/09/1963    Quit date: 05/09/2010    Years since quitting: 12.4   Smokeless tobacco: Never  Vaping Use   Vaping status: Never Used  Substance and Sexual Activity   Alcohol use: Not Currently   Drug use: Not Currently    Types: Cocaine    Comment: positive drug screen (cocaine, benzo's)07/2012 in emergency room   Sexual activity: Not Currently    Birth control/protection: Post-menopausal  Other Topics Concern   Not on file  Social History Narrative   Caffeine use daily.   Social Determinants of Health   Financial Resource Strain: Not on file  Food Insecurity: No Food Insecurity (10/19/2021)   Hunger Vital Sign    Worried About Running Out of Food in the Last Year: Never true    Ran Out of Food in the Last Year: Never true   Transportation Needs: No Transportation Needs (10/19/2021)   PRAPARE - Administrator, Civil Service (Medical): No    Lack of Transportation (Non-Medical): No  Recent Concern: Transportation Needs - Unmet Transportation Needs (09/12/2021)   PRAPARE - Administrator, Civil Service (Medical): Yes    Lack of Transportation (Non-Medical): Yes  Physical Activity: Not on file  Stress: Not on file  Social Connections: Not on file  Intimate Partner Violence: Not on file    FAMILY HISTORY: Family History  Problem Relation Age of Onset   Heart disease Mother    Diabetes Mother    Cancer Father        unsure what kind   Diabetes Sister    Colon cancer Neg Hx    Breast cancer Neg Hx     Review of Systems  Constitutional:  Negative for appetite change, chills, fatigue, fever and unexpected weight change.  HENT:   Negative for hearing loss, lump/mass and trouble swallowing.   Eyes:  Negative for eye problems and icterus.  Respiratory:  Positive for chest tightness and shortness of breath. Negative for cough.   Cardiovascular:  Positive for chest pain. Negative for leg swelling and palpitations.  Gastrointestinal:  Negative for abdominal distention, abdominal pain, constipation, diarrhea, nausea and vomiting.  Endocrine: Negative for hot flashes.  Genitourinary:  Negative for difficulty urinating.   Musculoskeletal:  Negative for arthralgias.  Skin:  Negative for itching and rash.  Neurological:  Negative for dizziness, extremity weakness, headaches and numbness.  Hematological:  Negative for adenopathy. Does not bruise/bleed easily.  Psychiatric/Behavioral:  Negative for depression. The patient is not nervous/anxious.       PHYSICAL EXAMINATION    Vitals:   10/22/22 0853  BP: (!) 152/53  Pulse: 75  Resp: 18  Temp: 97.8 F (36.6 C)  SpO2: 99%    Physical Exam Constitutional:      General: She is not in acute distress.    Appearance: Normal appearance.  She is not toxic-appearing.  HENT:     Head: Normocephalic and atraumatic.     Mouth/Throat:  Mouth: Mucous membranes are moist.     Pharynx: Oropharynx is clear. No oropharyngeal exudate or posterior oropharyngeal erythema.  Eyes:     General: No scleral icterus. Cardiovascular:     Rate and Rhythm: Normal rate and regular rhythm.     Pulses: Normal pulses.     Heart sounds: Normal heart sounds.  Pulmonary:     Effort: Pulmonary effort is normal.     Breath sounds: Normal breath sounds.  Chest:     Comments: Left breast benign, right chest wall s/p mastectomy, no sign of local recurrence, +TTP  Abdominal:     General: Abdomen is flat. Bowel sounds are normal. There is no distension.     Palpations: Abdomen is soft.     Tenderness: There is no abdominal tenderness.  Musculoskeletal:        General: No swelling.     Cervical back: Neck supple.  Lymphadenopathy:     Cervical: No cervical adenopathy.  Skin:    General: Skin is warm and dry.     Findings: No rash.  Neurological:     General: No focal deficit present.     Mental Status: She is alert.  Psychiatric:        Mood and Affect: Mood normal.        Behavior: Behavior normal.     LABORATORY DATA:  CBC    Component Value Date/Time   WBC 5.8 10/22/2022 0832   WBC 6.3 04/27/2022 1349   RBC 3.34 (L) 10/22/2022 0832   HGB 10.0 (L) 10/22/2022 0832   HGB 11.6 05/10/2021 1104   HCT 32.9 (L) 10/22/2022 0832   HCT 36.3 05/10/2021 1104   PLT 257 10/22/2022 0832   PLT 300 05/10/2021 1104   MCV 98.5 10/22/2022 0832   MCV 93 05/10/2021 1104   MCH 29.9 10/22/2022 0832   MCHC 30.4 10/22/2022 0832   RDW 14.5 10/22/2022 0832   RDW 13.5 05/10/2021 1104   LYMPHSABS 1.8 10/22/2022 0832   MONOABS 0.4 10/22/2022 0832   EOSABS 0.4 10/22/2022 0832   BASOSABS 0.0 10/22/2022 0832    CMP     Component Value Date/Time   NA 139 10/22/2022 0832   NA 141 05/10/2021 1104   K 3.5 10/22/2022 0832   CL 110 10/22/2022 0832    CO2 22 10/22/2022 0832   GLUCOSE 131 (H) 10/22/2022 0832   BUN 35 (H) 10/22/2022 0832   BUN 25 05/10/2021 1104   CREATININE 2.79 (H) 10/22/2022 0832   CALCIUM 9.2 10/22/2022 0832   PROT 8.2 (H) 10/22/2022 0832   PROT 7.7 05/10/2021 1104   ALBUMIN 4.1 10/22/2022 0832   ALBUMIN 4.3 05/10/2021 1104   AST 14 (L) 10/22/2022 0832   ALT 9 10/22/2022 0832   ALKPHOS 135 (H) 10/22/2022 0832   BILITOT 0.5 10/22/2022 0832   GFRNONAA 17 (L) 10/22/2022 0832   GFRAA 36 (L) 03/15/2020 1154   GFRAA 25 (L) 01/29/2019 1039    ASSESSMENT and THERAPY PLAN:   Malignant neoplasm of upper-outer quadrant of right breast in female, estrogen receptor positive (HCC) History of breast cancer: Currently on anastrozole therapy and doing well. Breast cancer surveillance:  Mammogram 03/08/2022: Benign breast density category A Breast exam 03/23/2022: Benign  CT chest ordered to r/o recurrence due to TTP and right chest wall discomfort.   She is experiencing some chest tightness and shortness of breath at today's visit.  Due to the nature of her issues I told her that I cannot tell  with certainty the etiology and recommended she go to the ER.  She declined noting she has been to the ER in the past and nothing has been wrong.  She plans on going home and trying her inhalers and also plans on calling her primary care provider to discuss recommendation should this happen again.  RTC in 6 months for labs and f/u.    MGUS (monoclonal gammopathy of unknown significance) Lab review: 11/14/2020: M spike: 1.9 g, IgG 2884, immunofixation shows IgG kappa Creatinine: 2.38 (baseline creatinine 1.6), calcium 9.7, albumin 4.5, hemoglobin 13.1 11/29/2020: M spike 1.6 g IgG kappa,Kappa lambda ratio: 9 (Kappa 136.9, lambda 15.2),beta-2 microglobulin: 3.4, creatinine 2.3, hemoglobin 12 03/17/2021: M spike: 1.3 g IgG kappa, kappa lambda ratio 7.72 (Kappa 195, lambda 25), creatinine 1.95, calcium 9.9, albumin 3.8, hemoglobin  10 09/13/2021: M spike 1.6 g, IgG kappa, IgG level 2091 Kappa to 72, lambda 21.1: Ratio 12.89, creatinine 2.36, calcium 10   12/09/2020: Bone survey: No lytic lesions The serum M protein levels have been fluctuating but overall they have been lower than the time of diagnosis in October 22.    She underwent lab testing today.  Those labs are pending and we will call her with results.  Suspect also anemia of chronic kidney disease.    RTC in 6 months for labs and f/u.     All questions were answered. The patient knows to call the clinic with any problems, questions or concerns. We can certainly see the patient much sooner if necessary.  Total encounter time:30 minutes*in face-to-face visit time, chart review, lab review, care coordination, order entry, and documentation of the encounter time.    Lillard Anes, NP 10/22/22 9:32 AM Medical Oncology and Hematology [facility] [address] [place], [place] [address] Tel. [phone]    Fax. [phone]  *Total Encounter Time as defined by the Centers for Medicare and Medicaid Services includes, in addition to the face-to-face time of a patient visit (documented in the note above) non-face-to-face time: obtaining and reviewing outside history, ordering and reviewing medications, tests or procedures, care coordination (communications with other health care professionals or caregivers) and documentation in the medical record.

## 2022-10-22 NOTE — Assessment & Plan Note (Signed)
Lab review: 11/14/2020: M spike: 1.9 g, IgG 2884, immunofixation shows IgG kappa Creatinine: 2.38 (baseline creatinine 1.6), calcium 9.7, albumin 4.5, hemoglobin 13.1 11/29/2020: M spike 1.6 g IgG kappa,Kappa lambda ratio: 9 (Kappa 136.9, lambda 15.2),beta-2 microglobulin: 3.4, creatinine 2.3, hemoglobin 12 03/17/2021: M spike: 1.3 g IgG kappa, kappa lambda ratio 7.72 (Kappa 195, lambda 25), creatinine 1.95, calcium 9.9, albumin 3.8, hemoglobin 10 09/13/2021: M spike 1.6 g, IgG kappa, IgG level 2091 Kappa to 72, lambda 21.1: Ratio 12.89, creatinine 2.36, calcium 10   12/09/2020: Bone survey: No lytic lesions The serum M protein levels have been fluctuating but overall they have been lower than the time of diagnosis in October 22.    She underwent lab testing today.  Those labs are pending and we will call her with results.  Suspect also anemia of chronic kidney disease.    RTC in 6 months for labs and f/u.

## 2022-10-22 NOTE — Assessment & Plan Note (Signed)
History of breast cancer: Currently on anastrozole therapy and doing well. Breast cancer surveillance:  Mammogram 03/08/2022: Benign breast density category A Breast exam 03/23/2022: Benign  CT chest ordered to r/o recurrence due to TTP and right chest wall discomfort.   She is experiencing some chest tightness and shortness of breath at today's visit.  Due to the nature of her issues I told her that I cannot tell with certainty the etiology and recommended she go to the ER.  She declined noting she has been to the ER in the past and nothing has been wrong.  She plans on going home and trying her inhalers and also plans on calling her primary care provider to discuss recommendation should this happen again.  RTC in 6 months for labs and f/u.

## 2022-10-23 LAB — KAPPA/LAMBDA LIGHT CHAINS
Kappa free light chain: 257.6 mg/L — ABNORMAL HIGH (ref 3.3–19.4)
Kappa, lambda light chain ratio: 12.21 — ABNORMAL HIGH (ref 0.26–1.65)
Lambda free light chains: 21.1 mg/L (ref 5.7–26.3)

## 2022-10-25 ENCOUNTER — Telehealth: Payer: Self-pay | Admitting: Adult Health

## 2022-10-25 NOTE — Telephone Encounter (Signed)
Patient is aware of scheduled appointment times/dates

## 2022-10-26 LAB — MULTIPLE MYELOMA PANEL, SERUM
Albumin SerPl Elph-Mcnc: 3.9 g/dL (ref 2.9–4.4)
Albumin/Glob SerPl: 1.1 (ref 0.7–1.7)
Alpha 1: 0.2 g/dL (ref 0.0–0.4)
Alpha2 Glob SerPl Elph-Mcnc: 0.8 g/dL (ref 0.4–1.0)
B-Globulin SerPl Elph-Mcnc: 0.8 g/dL (ref 0.7–1.3)
Gamma Glob SerPl Elph-Mcnc: 1.9 g/dL — ABNORMAL HIGH (ref 0.4–1.8)
Globulin, Total: 3.7 g/dL (ref 2.2–3.9)
IgA: 73 mg/dL (ref 64–422)
IgG (Immunoglobin G), Serum: 2537 mg/dL — ABNORMAL HIGH (ref 586–1602)
IgM (Immunoglobulin M), Srm: 11 mg/dL — ABNORMAL LOW (ref 26–217)
M Protein SerPl Elph-Mcnc: 1.7 g/dL — ABNORMAL HIGH
Total Protein ELP: 7.6 g/dL (ref 6.0–8.5)

## 2022-10-31 ENCOUNTER — Telehealth: Payer: Self-pay | Admitting: Hematology and Oncology

## 2022-10-31 NOTE — Telephone Encounter (Signed)
Called patient to reschedule 04/22/23 appointment per patient request.  Voicemail full.

## 2022-11-02 ENCOUNTER — Encounter (HOSPITAL_COMMUNITY): Payer: Self-pay

## 2022-11-02 ENCOUNTER — Other Ambulatory Visit: Payer: Self-pay

## 2022-11-02 ENCOUNTER — Inpatient Hospital Stay (HOSPITAL_COMMUNITY)
Admission: EM | Admit: 2022-11-02 | Discharge: 2022-11-05 | DRG: 313 | Disposition: A | Discharge status: Home / Self Care | Payer: 59 | Attending: Cardiovascular Disease | Admitting: Cardiovascular Disease

## 2022-11-02 ENCOUNTER — Emergency Department (HOSPITAL_COMMUNITY): Payer: 59

## 2022-11-02 DIAGNOSIS — Z79811 Long term (current) use of aromatase inhibitors: Secondary | ICD-10-CM

## 2022-11-02 DIAGNOSIS — Z87891 Personal history of nicotine dependence: Secondary | ICD-10-CM

## 2022-11-02 DIAGNOSIS — Z794 Long term (current) use of insulin: Secondary | ICD-10-CM

## 2022-11-02 DIAGNOSIS — K219 Gastro-esophageal reflux disease without esophagitis: Secondary | ICD-10-CM | POA: Diagnosis present

## 2022-11-02 DIAGNOSIS — Z96612 Presence of left artificial shoulder joint: Secondary | ICD-10-CM | POA: Diagnosis present

## 2022-11-02 DIAGNOSIS — Z9049 Acquired absence of other specified parts of digestive tract: Secondary | ICD-10-CM

## 2022-11-02 DIAGNOSIS — H9193 Unspecified hearing loss, bilateral: Secondary | ICD-10-CM | POA: Diagnosis present

## 2022-11-02 DIAGNOSIS — Z9011 Acquired absence of right breast and nipple: Secondary | ICD-10-CM

## 2022-11-02 DIAGNOSIS — Z955 Presence of coronary angioplasty implant and graft: Secondary | ICD-10-CM

## 2022-11-02 DIAGNOSIS — I2510 Atherosclerotic heart disease of native coronary artery without angina pectoris: Secondary | ICD-10-CM | POA: Diagnosis present

## 2022-11-02 DIAGNOSIS — Z853 Personal history of malignant neoplasm of breast: Secondary | ICD-10-CM

## 2022-11-02 DIAGNOSIS — I252 Old myocardial infarction: Secondary | ICD-10-CM

## 2022-11-02 DIAGNOSIS — Z79899 Other long term (current) drug therapy: Secondary | ICD-10-CM

## 2022-11-02 DIAGNOSIS — I249 Acute ischemic heart disease, unspecified: Secondary | ICD-10-CM | POA: Diagnosis present

## 2022-11-02 DIAGNOSIS — E1122 Type 2 diabetes mellitus with diabetic chronic kidney disease: Secondary | ICD-10-CM | POA: Diagnosis present

## 2022-11-02 DIAGNOSIS — Z7985 Long-term (current) use of injectable non-insulin antidiabetic drugs: Secondary | ICD-10-CM

## 2022-11-02 DIAGNOSIS — M109 Gout, unspecified: Secondary | ICD-10-CM | POA: Diagnosis present

## 2022-11-02 DIAGNOSIS — J4489 Other specified chronic obstructive pulmonary disease: Secondary | ICD-10-CM | POA: Diagnosis present

## 2022-11-02 DIAGNOSIS — D631 Anemia in chronic kidney disease: Secondary | ICD-10-CM | POA: Diagnosis present

## 2022-11-02 DIAGNOSIS — I129 Hypertensive chronic kidney disease with stage 1 through stage 4 chronic kidney disease, or unspecified chronic kidney disease: Secondary | ICD-10-CM | POA: Diagnosis present

## 2022-11-02 DIAGNOSIS — N184 Chronic kidney disease, stage 4 (severe): Secondary | ICD-10-CM | POA: Diagnosis present

## 2022-11-02 DIAGNOSIS — Z8249 Family history of ischemic heart disease and other diseases of the circulatory system: Secondary | ICD-10-CM

## 2022-11-02 DIAGNOSIS — Z7982 Long term (current) use of aspirin: Secondary | ICD-10-CM

## 2022-11-02 DIAGNOSIS — Z833 Family history of diabetes mellitus: Secondary | ICD-10-CM

## 2022-11-02 DIAGNOSIS — R0789 Other chest pain: Principal | ICD-10-CM | POA: Diagnosis present

## 2022-11-02 DIAGNOSIS — I700 Atherosclerosis of aorta: Secondary | ICD-10-CM | POA: Diagnosis present

## 2022-11-02 DIAGNOSIS — M159 Polyosteoarthritis, unspecified: Secondary | ICD-10-CM | POA: Diagnosis present

## 2022-11-02 DIAGNOSIS — Z7951 Long term (current) use of inhaled steroids: Secondary | ICD-10-CM

## 2022-11-02 DIAGNOSIS — E785 Hyperlipidemia, unspecified: Secondary | ICD-10-CM | POA: Diagnosis present

## 2022-11-02 DIAGNOSIS — R079 Chest pain, unspecified: Principal | ICD-10-CM

## 2022-11-02 DIAGNOSIS — K760 Fatty (change of) liver, not elsewhere classified: Secondary | ICD-10-CM | POA: Diagnosis present

## 2022-11-02 LAB — CBC WITH DIFFERENTIAL/PLATELET
Abs Immature Granulocytes: 0.27 10*3/uL — ABNORMAL HIGH (ref 0.00–0.07)
Basophils Absolute: 0 10*3/uL (ref 0.0–0.1)
Basophils Relative: 0 %
Eosinophils Absolute: 0 10*3/uL (ref 0.0–0.5)
Eosinophils Relative: 0 %
HCT: 32.3 % — ABNORMAL LOW (ref 36.0–46.0)
Hemoglobin: 10.3 g/dL — ABNORMAL LOW (ref 12.0–15.0)
Immature Granulocytes: 2 %
Lymphocytes Relative: 17 %
Lymphs Abs: 2.1 10*3/uL (ref 0.7–4.0)
MCH: 30.2 pg (ref 26.0–34.0)
MCHC: 31.9 g/dL (ref 30.0–36.0)
MCV: 94.7 fL (ref 80.0–100.0)
Monocytes Absolute: 0.9 10*3/uL (ref 0.1–1.0)
Monocytes Relative: 7 %
Neutro Abs: 9 10*3/uL — ABNORMAL HIGH (ref 1.7–7.7)
Neutrophils Relative %: 74 %
Platelets: 319 10*3/uL (ref 150–400)
RBC: 3.41 MIL/uL — ABNORMAL LOW (ref 3.87–5.11)
RDW: 14.9 % (ref 11.5–15.5)
WBC: 12.3 10*3/uL — ABNORMAL HIGH (ref 4.0–10.5)
nRBC: 0.7 % — ABNORMAL HIGH (ref 0.0–0.2)

## 2022-11-02 LAB — COMPREHENSIVE METABOLIC PANEL
ALT: 17 U/L (ref 0–44)
AST: 19 U/L (ref 15–41)
Albumin: 3.7 g/dL (ref 3.5–5.0)
Alkaline Phosphatase: 106 U/L (ref 38–126)
Anion gap: 14 (ref 5–15)
BUN: 45 mg/dL — ABNORMAL HIGH (ref 8–23)
CO2: 20 mmol/L — ABNORMAL LOW (ref 22–32)
Calcium: 9.4 mg/dL (ref 8.9–10.3)
Chloride: 104 mmol/L (ref 98–111)
Creatinine, Ser: 2.65 mg/dL — ABNORMAL HIGH (ref 0.44–1.00)
GFR, Estimated: 18 mL/min — ABNORMAL LOW (ref >60)
Glucose, Bld: 171 mg/dL — ABNORMAL HIGH (ref 70–99)
Potassium: 4.2 mmol/L (ref 3.5–5.1)
Sodium: 138 mmol/L (ref 135–145)
Total Bilirubin: 0.6 mg/dL (ref 0.3–1.2)
Total Protein: 7.8 g/dL (ref 6.5–8.1)

## 2022-11-02 LAB — TROPONIN I (HIGH SENSITIVITY): Troponin I (High Sensitivity): 6 ng/L (ref <18)

## 2022-11-02 LAB — BRAIN NATRIURETIC PEPTIDE: B Natriuretic Peptide: 124.8 pg/mL — ABNORMAL HIGH (ref 0.0–100.0)

## 2022-11-02 MED ORDER — IOHEXOL 350 MG/ML SOLN
80.0000 mL | Freq: Once | INTRAVENOUS | Status: AC | PRN
  Administered 2022-11-02: 80 mL via INTRAVENOUS

## 2022-11-02 MED ORDER — ACETAMINOPHEN 500 MG PO TABS
1000.0000 mg | ORAL_TABLET | Freq: Once | ORAL | Status: AC
  Administered 2022-11-02: 1000 mg via ORAL
  Filled 2022-11-02: qty 2

## 2022-11-02 NOTE — ED Triage Notes (Signed)
Pt from home c/o 2 day hx of central chest pain radiating to left with associated SOB. EMS report 40 point difference between L & R arm BP

## 2022-11-02 NOTE — ED Provider Notes (Signed)
[place] EMERGENCY DEPARTMENT AT [facility] Provider Note   CSN: [id number] Arrival date & time: 11/02/22  2158     History {Add pertinent medical, surgical, social history, OB history to HPI:1} Chief Complaint  Patient presents with   Chest Pain    [name] is a 72 y.o. female.  HPI 72 year old female with past medical history of COPD, CAD, diabetes, hypertension, hyperlipidemia, breast cancer presenting for evaluation of chest pain.  Patient states that she has had intermittent chest pain for the last week, however, beginning approximate 1 hour ago she describes intense worsening of her pain.  She describes sharp, tearing pain radiating from her center chest to her back.  EMS was called and administered aspirin as well as 1 round of nitroglycerin.  With EMS they noticed unequal pulses between her left radial and right radial arteries.  They obtained blood pressure from both sides and noted a 40 point difference with her right arm being 40 point systolic higher than the left.  Patient denies recent fever, cough, congestion, abdominal pain.    Home Medications Prior to Admission medications   Medication Sig Start Date End Date Taking? Authorizing Provider  albuterol (PROVENTIL) (2.5 MG/3ML) 0.083% nebulizer solution Take 3 mLs (2.5 mg total) by nebulization every 6 (six) hours as needed for wheezing or shortness of breath. 08/02/21   Margaretann Loveless, PA-C  albuterol (VENTOLIN HFA) 108 (90 Base) MCG/ACT inhaler Inhale 1-2 puffs into the lungs every 6 (six) hours as needed for wheezing or shortness of breath. 09/14/21   Anders Simmonds, PA-C  amLODipine (NORVASC) 10 MG tablet Take 1 tablet (10 mg total) by mouth daily. Please make an appt with your PCP for additional refills. 03/28/22   Hoy Register, MD  anastrozole (ARIMIDEX) 1 MG tablet TAKE ONE TABLET BY MOUTH ONCE DAILY 06/18/22   Serena Croissant, MD  aspirin 81 MG EC tablet Take 1 tablet (81 mg total) by mouth  daily. 10/13/18   Pokhrel, Rebekah Chesterfield, MD  atorvastatin (LIPITOR) 20 MG tablet Take 20 mg by mouth daily.    [provider]  benzonatate (TESSALON) 100 MG capsule Take 1 capsule (100 mg total) by mouth 3 (three) times daily as needed. 08/02/21   Margaretann Loveless, PA-C  Blood Pressure Monitor DEVI Please provide patient with insurance approved blood pressure monitor. I10.0 11/15/20   Claiborne Rigg, NP  ciprofloxacin (CIPRO) 500 MG tablet Take 500 mg by mouth 2 (two) times daily.    [provider]  cromolyn (OPTICROM) 4 % ophthalmic solution 1 drop 4 (four) times daily. 03/22/22   [provider]  diclofenac Sodium (VOLTAREN) 1 % GEL Apply 1 Application topically 4 (four) times daily. 09/13/21   Serena Croissant, MD  doxycycline (VIBRA-TABS) 100 MG tablet Take 1 tablet (100 mg total) by mouth 2 (two) times daily. 08/02/21   Margaretann Loveless, PA-C  escitalopram (LEXAPRO) 10 MG tablet Take 1 tablet (10 mg total) by mouth daily. DEPRESSION 03/29/21   Claiborne Rigg, NP  fluticasone (FLONASE) 50 MCG/ACT nasal spray Place 2 sprays into both nostrils daily. 05/10/21   Hoy Register, MD  fluticasone furoate-vilanterol (BREO ELLIPTA) 200-25 MCG/ACT AEPB Inhale 1 puff into the lungs daily. Must have office visit for refills 03/28/22   Hoy Register, MD  gabapentin (NEURONTIN) 100 MG capsule Take 100 mg by mouth 3 (three) times daily. 03/12/22   [provider]  Insulin Pen Needle (EASY TOUCH PEN NEEDLES) 31G X 8  MM MISC Use to inject insulin once daily and Victoza once daily. Max of 2 pen needles a day. Must have office visit for refills 03/28/22   Hoy Register, MD  LANTUS SOLOSTAR 100 UNIT/ML Solostar Pen Inject 5 Units into the skin at bedtime. Must have office visit for refills 03/28/22   Hoy Register, MD  lidocaine (LIDODERM) 5 % Place 1 patch onto the skin daily. Remove & Discard patch within 12 hours or as directed by MD 04/12/21   Ernie Avena, MD  liraglutide  (VICTOZA) 18 MG/3ML SOPN Inject 1.8 mg into the skin daily. Must have office visit for refills 03/28/22   Hoy Register, MD  loperamide (IMODIUM A-D) 2 MG tablet Take 2 mg by mouth. 02/07/22   [provider]  losartan (COZAAR) 50 MG tablet TAKE ONE TABLET BY MOUTH DAILY. STOP 25mg  DOSE 08/22/21   Claiborne Rigg, NP  metoprolol succinate (TOPROL-XL) 50 MG 24 hr tablet Take 1 tablet (50 mg total) by mouth daily. Must have office visit for refills 03/28/22   Hoy Register, MD  Misc. Devices MISC Please provide patient with nebulizer mask and tubing. [id number] 12/14/19   Claiborne Rigg, NP  Multiple Vitamin (MULTIVITAMIN WITH MINERALS) TABS tablet Take 1 tablet by mouth daily. [employer]entry    [provider]  nitroGLYCERIN (NITROSTAT) 0.4 MG SL tablet Place 1 tablet (0.4 mg total) under the tongue every 5 (five) minutes as needed for chest pain. 03/15/20   Claiborne Rigg, NP  Omega-3 Fatty Acids (FISH OIL) 1000 MG CAPS Take 1,000 mg by mouth daily.    [provider]  pantoprazole (PROTONIX) 40 MG tablet TAKE ONE TABLET BY MOUTH EVERY DAY FOR ACID REFLUX 11/02/21   Claiborne Rigg, NP  Respiratory Therapy Supplies (NEBULIZER MASK ADULT) MISC 1 Units by Does not apply route daily as needed. ICD 10 J44.9 05/08/21   Claiborne Rigg, NP  SYMBICORT 160-4.5 MCG/ACT inhaler 1 puff 2 (two) times daily as needed. 02/28/22   [provider]      Allergies    Patient has no known allergies.    Review of Systems   Review of Systems  Physical Exam Updated Vital Signs BP 90/64 (BP Location: Left Arm)   Pulse 76   Temp 98.6 F (37 C) (Oral)   Resp 16   Ht 4\' 11"  (1.499 m)   Wt 58 kg   LMP  (LMP Unknown) Comment: tubal ligation  SpO2 99%   BMI 25.83 kg/m  Physical Exam Constitutional:      Appearance: She is ill-appearing.     Comments: Appears uncomfortable, holding her chest  HENT:     Head: Normocephalic.     Right Ear: External ear normal.     Left Ear:  External ear normal.     Nose: Nose normal.     Mouth/Throat:     Mouth: Mucous membranes are moist.  Eyes:     Extraocular Movements: Extraocular movements intact.     Conjunctiva/sclera: Conjunctivae normal.  Cardiovascular:     Rate and Rhythm: Normal rate and regular rhythm.     Comments: Delayed timing left radial pulse compared to right Pulmonary:     Effort: Pulmonary effort is normal. No respiratory distress.     Breath sounds: No wheezing.  Abdominal:     General: Abdomen is flat.     Palpations: Abdomen is soft.     Tenderness: There is no abdominal tenderness. There is no guarding or  rebound.  Musculoskeletal:        General: Normal range of motion.     Right lower leg: No edema.     Left lower leg: No edema.  Skin:    General: Skin is warm and dry.     Findings: No rash.  Neurological:     General: No focal deficit present.     Mental Status: She is alert.     Cranial Nerves: No cranial nerve deficit.     Motor: No weakness.     ED Results / Procedures / Treatments   Labs (all labs ordered are listed, but only abnormal results are displayed) Labs Reviewed  CBC WITH DIFFERENTIAL/PLATELET - Abnormal; Notable for the following components:      Result Value   WBC 12.3 (*)    RBC 3.41 (*)    Hemoglobin 10.3 (*)    HCT 32.3 (*)    nRBC 0.7 (*)    Neutro Abs 9.0 (*)    Abs Immature Granulocytes 0.27 (*)    All other components within normal limits  COMPREHENSIVE METABOLIC PANEL - Abnormal; Notable for the following components:   CO2 20 (*)    Glucose, Bld 171 (*)    BUN 45 (*)    Creatinine, Ser 2.65 (*)    GFR, Estimated 18 (*)    All other components within normal limits  BRAIN NATRIURETIC PEPTIDE - Abnormal; Notable for the following components:   B Natriuretic Peptide 124.8 (*)    All other components within normal limits  TROPONIN I (HIGH SENSITIVITY)    EKG None  Radiology CT Angio Chest/Abd/Pel for Dissection W and/or Wo Contrast  Result  Date: 11/02/2022 CLINICAL DATA:  Chest pain with unequal pressures. EXAM: CT ANGIOGRAPHY CHEST, ABDOMEN AND PELVIS TECHNIQUE: Non-contrast CT of the chest was initially obtained. Multidetector CT imaging through the chest, abdomen and pelvis was performed using the standard protocol during bolus administration of intravenous contrast. Multiplanar reconstructed images and MIPs were obtained and reviewed to evaluate the vascular anatomy. RADIATION DOSE REDUCTION: This exam was performed according to the departmental dose-optimization program which includes automated exposure control, adjustment of the mA and/or kV according to patient size and/or use of iterative reconstruction technique. CONTRAST:  80mL OMNIPAQUE IOHEXOL 350 MG/ML SOLN COMPARISON:  CT abdomen and pelvis 08/23/2021.  CT chest 03/22/2018 FINDINGS: CTA CHEST FINDINGS Cardiovascular: Noncontrast images of the chest demonstrate prominent calcification of the aorta, coronary arteries, and great vessel origins. No evidence of intramural hematoma. Images obtained during the arterial phase after intravenous administration of contrast material demonstrate normal caliber thoracic aorta. No aneurysm or dissection. Great vessel origins are patent. Mild cardiac enlargement. No pericardial effusions. Central pulmonary arteries are well opacified. No evidence of significant pulmonary embolus. Mediastinum/Nodes: No enlarged mediastinal, hilar, or axillary lymph nodes. Thyroid gland, trachea, and esophagus demonstrate no significant findings. Lungs/Pleura: Motion artifact limits examination. Probable atelectasis in the lung bases. Emphysematous changes. No focal consolidation or edema. No pleural effusion or pneumothorax. Musculoskeletal: Postoperative changes in the left shoulder. Degenerative changes in the spine. No acute bony abnormalities. Review of the MIP images confirms the above findings. CTA ABDOMEN AND PELVIS FINDINGS VASCULAR Aorta: Severe calcification  throughout the abdominal aorta. No aneurysm or dissection. Several focal areas of low-grade to moderate stenosis are present, largest representing about 50% diameter reduction. Celiac: Patent without evidence of aneurysm, dissection, vasculitis or significant stenosis. SMA: Patent without evidence of aneurysm, dissection, vasculitis or significant stenosis. Renals: Both renal arteries are  patent without evidence of aneurysm, dissection, vasculitis, fibromuscular dysplasia or significant stenosis. IMA: Patent without evidence of aneurysm, dissection, vasculitis or significant stenosis. Inflow: Patent without evidence of aneurysm, dissection, vasculitis or significant stenosis. Veins: No obvious venous abnormality within the limitations of this arterial phase study. Review of the MIP images confirms the above findings. NON-VASCULAR Hepatobiliary: Gallbladder appears surgically absent. No focal liver lesions. Pancreas: Unremarkable. No pancreatic ductal dilatation or surrounding inflammatory changes. Spleen: Normal in size without focal abnormality. Adrenals/Urinary Tract: Adrenal glands are unremarkable. Kidneys are normal, without renal calculi, focal lesion, or hydronephrosis. Bladder is unremarkable. Stomach/Bowel: Stomach, small bowel, and colon are not abnormally distended. No wall thickening or inflammatory changes are seen. Appendix is not identified. Lymphatic: No significant lymphadenopathy. Reproductive: Limited visualization.  No pelvic mass identified. Other: Small bilateral inguinal hernias containing fat. No free air or free fluid. Musculoskeletal: Degenerative changes in the spine. No acute bony abnormalities. Review of the MIP images confirms the above findings. IMPRESSION: 1. Extensive calcific atherosclerotic changes in the thoracic and abdominal aorta. No evidence of aortic aneurysm or dissection. 2. No evidence of active pulmonary disease. Atelectasis in the lung bases. Emphysematous changes. For 3  no acute process demonstrated in the abdomen or pelvis. No evidence of bowel obstruction or inflammation. 3. Small bilateral inguinal hernias containing fat. Electronically Signed   By: Burman Nieves M.D.   On: 11/02/2022 22:42   DG Chest Portable 1 View  Result Date: 11/02/2022 CLINICAL DATA:  Chest pain EXAM: PORTABLE CHEST 1 VIEW COMPARISON:  04/27/2022 FINDINGS: Mild cardiomegaly. No confluent opacities, effusions or edema. No acute bony abnormality. IMPRESSION: Cardiomegaly.  No active disease. Electronically Signed   By: Charlett Nose M.D.   On: 11/02/2022 22:28    Procedures Procedures  {Document cardiac monitor, telemetry assessment procedure when appropriate:1}  Medications Ordered in ED Medications  acetaminophen (TYLENOL) tablet 1,000 mg (has no administration in time range)  iohexol (OMNIPAQUE) 350 MG/ML injection 80 mL (80 mLs Intravenous Contrast Given 11/02/22 2223)    ED Course/ Medical Decision Making/ A&P   {   Click here for ABCD2, HEART and other calculatorsREFRESH Note before signing :1}          HEART Score: 5                    Medical Decision Making Amount and/or Complexity of Data Reviewed Labs: ordered. Radiology: ordered.  Risk Prescription drug management.   Pt is a 72 y.o. female with pertinent PMHX of COPD, CAD, diabetes, hypertension, hyperlipidemia, breast cancer who presents w/ central chest pain tearing to the back.  On arrival, patient had pulse discrepancy between left and right radial pulses -blood pressure on the left side of 79/47, while on the right side was 120 systolic.  As patient presented with tearing chest pain rating to the back with unequal pulses and unequal blood pressures, there is definite concern for dissection.  CT dissection protocol was completed, fortunately did not show any sign of dissection throughout the aorta.  No obvious pulmonary disease was seen on this imaging either.  Because of the age and risk factors of the  patient, ACS will be ruled out with troponin x 2. ASA was given by EMS. Nitro was given by EMS as well. Chest XR and EKG performed. EKG: Sinus rhythm without concerning ST segment elevations or depressions.  Isolated T wave inversions in lead aVL, consistent with prior.  Not a STEMI.. EKG reviewed by myself and the  attending.   Initial troponin 6. HEAR score calculation: 5   Unlikely PNA as CXR without focal consolidation, no cough, no fever. Unlikely PE as atypical presentation, no PE seen on dissection study, oxygenating well on room air, normal HR. Presentation not consistent with Aortic Dissection, Pancreatitis, Arrhythmia, Pneumothorax, Endo/Myo/Pericarditis, Shingles, Emergent complications of an Ulcer, Esophageal pathology, or other emergent pathology. Other differential considered includes: GERD, Gastritis, Trauma, Viral Infection, MSK Pain, Costochondritis, Chest wall pain, Myalgia, COPD, CHF, Biliary Disease    Labs and imaging reviewed by myself and considered in medical decision making if ordered.  Imaging interpreted by radiology.  The plan for this patient was discussed with Dr. Rubin Payor, who voiced agreement and who oversaw evaluation and treatment of this patient.   {Document critical care time when appropriate:1} {Document review of labs and clinical decision tools ie heart score, Chads2Vasc2 etc:1}  {Document your independent review of radiology images, and any outside records:1} {Document your discussion with family members, caretakers, and with consultants:1} {Document social determinants of health affecting pt's care:1} {Document your decision making why or why not admission, treatments were needed:1} Final Clinical Impression(s) / ED Diagnoses Final diagnoses:  None    Rx / DC Orders ED Discharge Orders     None

## 2022-11-03 ENCOUNTER — Other Ambulatory Visit (HOSPITAL_COMMUNITY): Payer: 59

## 2022-11-03 ENCOUNTER — Observation Stay (HOSPITAL_COMMUNITY): Payer: 59

## 2022-11-03 ENCOUNTER — Encounter (HOSPITAL_COMMUNITY): Payer: Self-pay | Admitting: Cardiovascular Disease

## 2022-11-03 DIAGNOSIS — I249 Acute ischemic heart disease, unspecified: Secondary | ICD-10-CM | POA: Diagnosis present

## 2022-11-03 LAB — HEPARIN LEVEL (UNFRACTIONATED)
Heparin Unfractionated: 0.62 [IU]/mL (ref 0.30–0.70)
Heparin Unfractionated: 0.15 [IU]/mL — ABNORMAL LOW (ref 0.30–0.70)

## 2022-11-03 LAB — BASIC METABOLIC PANEL
Anion gap: 11 (ref 5–15)
BUN: 45 mg/dL — ABNORMAL HIGH (ref 8–23)
CO2: 20 mmol/L — ABNORMAL LOW (ref 22–32)
Calcium: 9.1 mg/dL (ref 8.9–10.3)
Chloride: 104 mmol/L (ref 98–111)
Creatinine, Ser: 2.62 mg/dL — ABNORMAL HIGH (ref 0.44–1.00)
GFR, Estimated: 19 mL/min — ABNORMAL LOW (ref >60)
Glucose, Bld: 248 mg/dL — ABNORMAL HIGH (ref 70–99)
Potassium: 4.6 mmol/L (ref 3.5–5.1)
Sodium: 135 mmol/L (ref 135–145)

## 2022-11-03 LAB — LIPID PANEL
Cholesterol: 132 mg/dL (ref 0–200)
HDL: 50 mg/dL (ref >40)
LDL Cholesterol: 72 mg/dL (ref 0–99)
Total CHOL/HDL Ratio: 2.6 {ratio}
Triglycerides: 50 mg/dL (ref <150)
VLDL: 10 mg/dL (ref 0–40)

## 2022-11-03 LAB — CBC
HCT: 32.2 % — ABNORMAL LOW (ref 36.0–46.0)
Hemoglobin: 10.4 g/dL — ABNORMAL LOW (ref 12.0–15.0)
MCH: 31.2 pg (ref 26.0–34.0)
MCHC: 32.3 g/dL (ref 30.0–36.0)
MCV: 96.7 fL (ref 80.0–100.0)
Platelets: 304 10*3/uL (ref 150–400)
RBC: 3.33 MIL/uL — ABNORMAL LOW (ref 3.87–5.11)
RDW: 14.9 % (ref 11.5–15.5)
WBC: 10.8 10*3/uL — ABNORMAL HIGH (ref 4.0–10.5)
nRBC: 0.5 % — ABNORMAL HIGH (ref 0.0–0.2)

## 2022-11-03 LAB — TROPONIN I (HIGH SENSITIVITY): Troponin I (High Sensitivity): 7 ng/L (ref <18)

## 2022-11-03 MED ORDER — ATORVASTATIN CALCIUM 10 MG PO TABS
20.0000 mg | ORAL_TABLET | Freq: Every day | ORAL | Status: DC
  Administered 2022-11-04: 20 mg via ORAL
  Filled 2022-11-03: qty 2

## 2022-11-03 MED ORDER — AMLODIPINE BESYLATE 5 MG PO TABS
10.0000 mg | ORAL_TABLET | Freq: Every day | ORAL | Status: DC
  Filled 2022-11-03: qty 2

## 2022-11-03 MED ORDER — INSULIN GLARGINE-YFGN 100 UNIT/ML ~~LOC~~ SOLN
5.0000 [IU] | Freq: Every day | SUBCUTANEOUS | Status: DC
  Administered 2022-11-04: 5 [IU] via SUBCUTANEOUS
  Filled 2022-11-03 (×3): qty 0.05

## 2022-11-03 MED ORDER — ESCITALOPRAM OXALATE 10 MG PO TABS
10.0000 mg | ORAL_TABLET | Freq: Every day | ORAL | Status: DC
  Administered 2022-11-04: 10 mg via ORAL
  Filled 2022-11-03: qty 1

## 2022-11-03 MED ORDER — PANTOPRAZOLE SODIUM 40 MG IV SOLR
40.0000 mg | INTRAVENOUS | Status: DC
  Administered 2022-11-03: 40 mg via INTRAVENOUS
  Filled 2022-11-03: qty 10

## 2022-11-03 MED ORDER — NITROGLYCERIN 0.4 MG SL SUBL
0.4000 mg | SUBLINGUAL_TABLET | SUBLINGUAL | Status: DC | PRN
  Administered 2022-11-03 – 2022-11-05 (×3): 0.4 mg via SUBLINGUAL
  Filled 2022-11-03 (×2): qty 1

## 2022-11-03 MED ORDER — HEPARIN BOLUS VIA INFUSION
3000.0000 [IU] | Freq: Once | INTRAVENOUS | Status: AC
  Administered 2022-11-03: 3000 [IU] via INTRAVENOUS
  Filled 2022-11-03: qty 3000

## 2022-11-03 MED ORDER — GABAPENTIN 100 MG PO CAPS
100.0000 mg | ORAL_CAPSULE | Freq: Three times a day (TID) | ORAL | Status: DC
  Administered 2022-11-03 – 2022-11-05 (×6): 100 mg via ORAL
  Filled 2022-11-03 (×6): qty 1

## 2022-11-03 MED ORDER — ANASTROZOLE 1 MG PO TABS
1.0000 mg | ORAL_TABLET | Freq: Every day | ORAL | Status: DC
  Administered 2022-11-04: 1 mg via ORAL
  Filled 2022-11-03 (×3): qty 1

## 2022-11-03 MED ORDER — ACETAMINOPHEN 325 MG PO TABS: 650.0000 mg | ORAL_TABLET | ORAL | Status: DC | PRN

## 2022-11-03 MED ORDER — OXYCODONE HCL 5 MG PO TABS
5.0000 mg | ORAL_TABLET | Freq: Four times a day (QID) | ORAL | Status: DC | PRN
  Administered 2022-11-03 – 2022-11-05 (×7): 5 mg via ORAL
  Filled 2022-11-03 (×7): qty 1

## 2022-11-03 MED ORDER — ALBUTEROL SULFATE (2.5 MG/3ML) 0.083% IN NEBU
2.5000 mg | INHALATION_SOLUTION | Freq: Four times a day (QID) | RESPIRATORY_TRACT | Status: DC | PRN
  Administered 2022-11-03 – 2022-11-04 (×2): 2.5 mg via RESPIRATORY_TRACT
  Filled 2022-11-03 (×2): qty 3

## 2022-11-03 MED ORDER — LIRAGLUTIDE 18 MG/3ML ~~LOC~~ SOPN: 1.8000 mg | PEN_INJECTOR | Freq: Every day | SUBCUTANEOUS | Status: DC

## 2022-11-03 MED ORDER — METOPROLOL SUCCINATE ER 25 MG PO TB24
50.0000 mg | ORAL_TABLET | Freq: Every day | ORAL | Status: DC
  Administered 2022-11-04: 50 mg via ORAL
  Filled 2022-11-03: qty 2

## 2022-11-03 MED ORDER — PANTOPRAZOLE SODIUM 40 MG PO TBEC
40.0000 mg | DELAYED_RELEASE_TABLET | Freq: Every day | ORAL | Status: DC
  Administered 2022-11-04: 40 mg via ORAL
  Filled 2022-11-03: qty 1

## 2022-11-03 MED ORDER — ASPIRIN 81 MG PO TBEC
81.0000 mg | DELAYED_RELEASE_TABLET | Freq: Every day | ORAL | Status: DC
  Administered 2022-11-04: 81 mg via ORAL
  Filled 2022-11-03: qty 1

## 2022-11-03 MED ORDER — HEPARIN (PORCINE) 25000 UT/250ML-% IV SOLN
600.0000 [IU]/h | INTRAVENOUS | Status: DC
  Administered 2022-11-03: 600 [IU]/h via INTRAVENOUS
  Filled 2022-11-03: qty 250

## 2022-11-03 MED ORDER — ONDANSETRON HCL 4 MG/2ML IJ SOLN: 4.0000 mg | Freq: Four times a day (QID) | INTRAMUSCULAR | Status: DC | PRN

## 2022-11-03 MED ORDER — FISH OIL 1000 MG PO CAPS: 1000.0000 mg | ORAL_CAPSULE | Freq: Every day | ORAL | Status: DC

## 2022-11-03 MED ORDER — ALBUTEROL SULFATE HFA 108 (90 BASE) MCG/ACT IN AERS: 1.0000 | INHALATION_SPRAY | Freq: Four times a day (QID) | RESPIRATORY_TRACT | Status: DC | PRN

## 2022-11-03 MED ORDER — HEPARIN (PORCINE) 25000 UT/250ML-% IV SOLN
700.0000 [IU]/h | INTRAVENOUS | Status: DC
  Filled 2022-11-03: qty 250

## 2022-11-03 MED ORDER — SODIUM CHLORIDE 0.9 % IV SOLN: INTRAVENOUS | Status: DC

## 2022-11-03 MED ORDER — FLUTICASONE FUROATE-VILANTEROL 200-25 MCG/ACT IN AEPB
1.0000 | INHALATION_SPRAY | Freq: Every day | RESPIRATORY_TRACT | Status: DC
  Administered 2022-11-04: 1 via RESPIRATORY_TRACT
  Filled 2022-11-03 (×2): qty 28

## 2022-11-03 MED ORDER — ADULT MULTIVITAMIN W/MINERALS CH
1.0000 | ORAL_TABLET | Freq: Every day | ORAL | Status: DC
  Administered 2022-11-04: 1 via ORAL
  Filled 2022-11-03: qty 1

## 2022-11-03 NOTE — Progress Notes (Addendum)
ANTICOAGULATION CONSULT NOTE - Follow-up Note  Pharmacy Consult for Heparin Indication: chest pain/ACS  No Known Allergies  Patient Measurements: Height: 4\' 11"  (149.9 cm) Weight: 58 kg (127 lb 13.9 oz) IBW/kg (Calculated) : 43.2 Heparin Dosing Weight: 55.2 kg  Vital Signs: Temp: 98 F (36.7 C) (09/28 0351) Temp Source: Oral (09/28 0351) BP: 129/96 (09/28 1015) Pulse Rate: 80 (09/28 1015)  Labs: Recent Labs    11/02/22 2206 11/03/22 0351 11/03/22 1005  HGB 10.3* 10.4*  --   HCT 32.3* 32.2*  --   PLT 319 304  --   HEPARINUNFRC  --   --  0.62  CREATININE 2.65* 2.62*  --   TROPONINIHS 6 7  --     Estimated Creatinine Clearance: 14.6 mL/min (A) (by C-G formula based on SCr of 2.62 mg/dL (H)).   Medical History: Past Medical History:  Diagnosis Date   Ambulates with cane    straight   Angina    no current problems per patient at PAT appt 11/05/18, nitroglycerin sl 06/30/20   Arthritis    HANDS"   Asthma    Breast cancer (HCC)    Cancer (HCC) 09/04/2017   Right Breast   Carpal tunnel syndrome, bilateral 11/26/2016   Cholelithiasis    Cocaine abuse (HCC) 07/2012   per [facility] drug screen   COPD (chronic obstructive pulmonary disease) (HCC)    per patient   Coronary artery disease    Diabetes mellitus without mention of complication    type 2   Dysphagia    esophageal dysmotility on 03/2011 esophagram    Fatty liver disease, nonalcoholic 2009   on Imaging.    GERD (gastroesophageal reflux disease)    Headache    Hearing loss    bilateral - no hearing aids   History of colonic polyps 2009   adenomatous 2009, HP in 2009, 2011, 2013.    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   Wears dentures    full upper and partial lower   Wears glasses     Medications:  (Not in a hospital admission)  Scheduled:    amLODipine  10 mg Oral Daily   anastrozole  1 mg Oral Daily   [START ON 11/04/2022] aspirin EC  81 mg Oral Daily   atorvastatin  20 mg Oral Daily   escitalopram  10 mg Oral Daily   Fish Oil  1,000 mg Oral Daily   fluticasone furoate-vilanterol  1 puff Inhalation Daily   gabapentin  100 mg Oral TID   insulin glargine-yfgn  5 Units Subcutaneous Daily   liraglutide  1.8 mg Subcutaneous Daily   metoprolol succinate  50 mg Oral Daily   multivitamin with minerals  1 tablet Oral Daily   pantoprazole  40 mg Oral Daily   Infusions:   sodium chloride 50 mL/hr at 11/03/22 0304   heparin 600 Units/hr (11/03/22 0252)   PRN: acetaminophen, albuterol, nitroGLYCERIN, ondansetron (ZOFRAN) IV, oxyCODONE  Assessment: 50 yoF presented to ED with chest pain that has been ongoing for last week. Pharmacy has been consulted to dose heparin for ACS.   Patient was not on anticoagulation prior to arrival. Started on 600 units/hr IV heparin following 3000 unit IV heparin bolus. Resulting heparin level is 0.62 which is therapeutic.  No issues with infusion or  bleeding per RN.  Hgb 10.4; plt 304  Goal of Therapy:  Heparin level 0.3-0.7 units/ml Monitor platelets by anticoagulation protocol: Yes   Plan:  Heparin level is ~ 7 hour level but will continue heparin infusion at 600 units/hr and adjust at re-check if needed Check anti-Xa level at 1800 and daily while on heparin Continue to monitor H&H and platelets  Delmar Landau, PharmD, BCPS 11/03/2022 10:50 AM ED Clinical Pharmacist -  [phone]

## 2022-11-03 NOTE — Progress Notes (Addendum)
ANTICOAGULATION CONSULT NOTE - Follow-up Note  Pharmacy Consult for Heparin Indication: chest pain/ACS  No Known Allergies  Patient Measurements: Height: 4\' 11"  (149.9 cm) Weight: 58 kg (127 lb 13.9 oz) IBW/kg (Calculated) : 43.2 Heparin Dosing Weight: 55.2 kg  Vital Signs: Temp: 98 F (36.7 C) (09/28 1709) Temp Source: Oral (09/28 1709) BP: 101/76 (09/28 1709) Pulse Rate: 78 (09/28 1709)  Labs: Recent Labs    11/02/22 2206 11/03/22 0351 11/03/22 1005 11/03/22 1741  HGB 10.3* 10.4*  --   --   HCT 32.3* 32.2*  --   --   PLT 319 304  --   --   HEPARINUNFRC  --   --  0.62 0.15*  CREATININE 2.65* 2.62*  --   --   TROPONINIHS 6 7  --   --     Estimated Creatinine Clearance: 14.6 mL/min (A) (by C-G formula based on SCr of 2.62 mg/dL (H)).   Medical History: Past Medical History:  Diagnosis Date   Ambulates with cane    straight   Angina    no current problems per patient at PAT appt 11/05/18, nitroglycerin sl 06/30/20   Arthritis    HANDS"   Asthma    Breast cancer (HCC)    Cancer (HCC) 09/04/2017   Right Breast   Carpal tunnel syndrome, bilateral 11/26/2016   Cholelithiasis    Cocaine abuse (HCC) 07/2012   per [facility] drug screen   COPD (chronic obstructive pulmonary disease) (HCC)    per patient   Coronary artery disease    Diabetes mellitus without mention of complication    type 2   Dysphagia    esophageal dysmotility on 03/2011 esophagram    Fatty liver disease, nonalcoholic 2009   on Imaging.    GERD (gastroesophageal reflux disease)    Headache    Hearing loss    bilateral - no hearing aids   History of colonic polyps 2009   adenomatous 2009, HP in 2009, 2011, 2013.    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   Past Week   albuterol (PROVENTIL) (2.5 MG/3ML) 0.083% nebulizer solution Take 3 mLs (2.5 mg total) by nebulization every 6 (six) hours as needed for wheezing or shortness of breath. 75 mL 3 11/02/2022   albuterol (VENTOLIN HFA) 108 (90 Base) MCG/ACT inhaler Inhale 1-2 puffs into the lungs every 6 (six) hours as needed for wheezing or shortness of breath. 18 g 2 11/03/2022   allopurinol (ZYLOPRIM) 100 MG tablet Take 100 mg by mouth daily.   11/02/2022   amLODipine (NORVASC) 5 MG tablet Take 5 mg by mouth daily.   11/02/2022   anastrozole (ARIMIDEX) 1 MG tablet TAKE ONE TABLET BY MOUTH ONCE DAILY 90 tablet 0 11/02/2022   aspirin 81 MG EC tablet Take 1 tablet (81 mg total) by mouth daily. 30 tablet 0 11/02/2022   atorvastatin (LIPITOR) 20 MG tablet Take 20 mg by mouth daily.   11/02/2022   benzonatate (TESSALON) 100 MG capsule  Take 1 capsule (100 mg total) by mouth 3 (three) times daily as needed. 30 capsule 0 11/02/2022   busPIRone (BUSPAR) 10 MG tablet Take 10 mg by mouth 2 (two) times daily.   11/02/2022   diclofenac Sodium (VOLTAREN) 1 % GEL Apply 1 Application topically 4 (four) times daily. (Patient taking differently: Apply 1 Application topically in the morning and at bedtime.) 100 g 3 11/02/2022   Dulaglutide (TRULICITY) 0.75 MG/0.5ML SOPN Inject 0.75 mg into the skin once a week.   10/28/2022   DULoxetine (CYMBALTA) 30 MG capsule Take 30 mg by mouth daily.   11/02/2022   ferrous sulfate 324 MG TBEC Take 324 mg by mouth daily.   11/02/2022   fluticasone (FLONASE) 50 MCG/ACT nasal spray Place 2 sprays into both nostrils daily. 16 g 6 11/02/2022   fluticasone furoate-vilanterol (BREO ELLIPTA) 200-25 MCG/ACT AEPB Inhale 1 puff into the lungs daily. Must have office visit for refills 60 each 0 11/02/2022   gabapentin (NEURONTIN)  100 MG capsule Take 100-200 mg by mouth See admin instructions. Take one tablet by mouth in the morning and afternoon, then take 2 tablets every night per patient   11/02/2022   LANTUS SOLOSTAR 100 UNIT/ML Solostar Pen Inject 5 Units into the skin at bedtime. Must have office visit for refills 15 mL 0 11/02/2022   lidocaine (LIDODERM) 5 % Place 1 patch onto the skin daily. Remove & Discard patch within 12 hours or as directed by MD (Patient taking differently: Place 1 patch onto the skin daily as needed (for pain).) 30 patch 0 Past Week   losartan (COZAAR) 50 MG tablet TAKE ONE TABLET BY MOUTH DAILY. STOP 25mg  DOSE 90 tablet 0 11/02/2022   metoprolol succinate (TOPROL-XL) 50 MG 24 hr tablet Take 1 tablet (50 mg total) by mouth daily. Must have office visit for refills 30 tablet 0 11/02/2022 at 1800   Multiple Vitamin (MULTIVITAMIN WITH MINERALS) TABS tablet Take 1 tablet by mouth daily. Sentry   11/02/2022   nitroGLYCERIN (NITROSTAT) 0.4 MG SL tablet Place 1 tablet (0.4 mg total) under the tongue every 5 (five) minutes as needed for chest pain. 10 tablet 0 11/03/2022   ondansetron (ZOFRAN-ODT) 4 MG disintegrating tablet Take 4 mg by mouth every 8 (eight) hours as needed for nausea or vomiting.   11/02/2022   pantoprazole (PROTONIX) 40 MG tablet TAKE ONE TABLET BY MOUTH EVERY DAY FOR ACID REFLUX 90 tablet 0 11/02/2022   SYMBICORT 160-4.5 MCG/ACT inhaler Inhale 2 puffs into the lungs in the morning and at bedtime.   11/02/2022   Blood Pressure Monitor DEVI Please provide patient with insurance approved blood pressure monitor. I10.0 1 each 0    Insulin Pen Needle (EASY TOUCH PEN NEEDLES) 31G X 8 MM MISC Use to inject insulin once daily and Victoza once daily. Max of 2 pen needles a day. Must have office visit for refills 100 each 0    Misc. Devices MISC Please provide patient with nebulizer mask and tubing. ICD-10J44.9 1 each 0    Respiratory Therapy Supplies (NEBULIZER MASK ADULT) MISC 1 Units by Does not apply  route daily as needed. ICD 10 J44.9 1 each 0    Scheduled:   amLODipine  10 mg Oral Daily   anastrozole  1 mg Oral Daily   [START ON 11/04/2022] aspirin EC  81 mg Oral Daily   atorvastatin  20 mg Oral Daily   escitalopram  10 mg Oral Daily   fluticasone furoate-vilanterol  1  puff Inhalation Daily   gabapentin  100 mg Oral TID   insulin glargine-yfgn  5 Units Subcutaneous Daily   metoprolol succinate  50 mg Oral Daily   multivitamin with minerals  1 tablet Oral Daily   pantoprazole  40 mg Oral Daily   Infusions:   sodium chloride 50 mL/hr at 11/03/22 0304   heparin 600 Units/hr (11/03/22 0252)   PRN: acetaminophen, albuterol, nitroGLYCERIN, ondansetron (ZOFRAN) IV, oxyCODONE  Assessment: 47 yoF presented to ED with chest pain that has been ongoing for last week. Pharmacy has been consulted to dose heparin for ACS.  Patient was not on anticoagulation prior to arrival.   Heparin level 0.15 subtherapeutic on 600 units/hr, down from ~7 hour therapeutic anti-Xa of 0.62 this morning.  No interruptions in infusion per RN.    Goal of Therapy:  Heparin level 0.3-0.7 units/ml Monitor platelets by anticoagulation protocol: Yes   Plan:  Increase heparin infusion to 800 units/hr 8 hour heparin level Daily CBC and anti-Xa level while on heparin Continue to monitor H&H and platelets  Trixie Rude, PharmD Clinical Pharmacist 11/03/2022  7:04 PM

## 2022-11-03 NOTE — Progress Notes (Signed)
ANTICOAGULATION CONSULT NOTE - Initial Consult  Pharmacy Consult for heparin Indication: chest pain/ACS  No Known Allergies  Patient Measurements: Height: 4\' 11"  (149.9 cm) Weight: 58 kg (127 lb 13.9 oz) IBW/kg (Calculated) : 43.2 Heparin Dosing Weight: 55.2 kg  Vital Signs: Temp: 98.2 F (36.8 C) (09/27 2345) Temp Source: Oral (09/27 2345) BP: 141/52 (09/28 0000) Pulse Rate: 75 (09/28 0000)  Labs: Recent Labs    11/02/22 2206  HGB 10.3*  HCT 32.3*  PLT 319  CREATININE 2.65*  TROPONINIHS 6    Estimated Creatinine Clearance: 14.4 mL/min (A) (by C-G formula based on SCr of 2.65 mg/dL (H)).   Medical History: Past Medical History:  Diagnosis Date   Ambulates with cane    straight   Angina    no current problems per patient at PAT appt 11/05/18, nitroglycerin sl 06/30/20   Arthritis    HANDS"   Asthma    Breast cancer (HCC)    Cancer (HCC) 09/04/2017   Right Breast   Carpal tunnel syndrome, bilateral 11/26/2016   Cholelithiasis    Cocaine abuse (HCC) 07/2012   per [facility] drug screen   COPD (chronic obstructive pulmonary disease) (HCC)    per patient   Coronary artery disease    Diabetes mellitus without mention of complication    type 2   Dysphagia    esophageal dysmotility on 03/2011 esophagram    Fatty liver disease, nonalcoholic 2009   on Imaging.    GERD (gastroesophageal reflux disease)    Headache    Hearing loss    bilateral - no hearing aids   History of colonic polyps 2009   adenomatous 2009, HP in 2009, 2011, 2013.    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 Pharmacy has been consulted to dose heparin for ACS. No PTA anticoagulation. EKG  showed sinus rhythm without concerning ST segment elevations or depressions.  Hgb 10.6, plts WNL, sCr 2.64, Trops 6  Goal of Therapy:  Heparin level 0.3-0.7 units/ml Monitor platelets by anticoagulation protocol: Yes   Plan:  Give 3000 units bolus x 1 Start heparin infusion at 600 units/hr Check anti-Xa level in 8 hours and daily while on heparin Continue to monitor H&H and platelets  Arabella Merles, PharmD. Clinical Pharmacist 11/03/2022 12:54 AM

## 2022-11-03 NOTE — Progress Notes (Signed)
Echocardiogram 2D Echocardiogram has been performed.  Lucendia Herrlich 11/03/2022, 7:17 PM

## 2022-11-03 NOTE — H&P (Signed)
Referring Physician: M. Harwani  [name] is an 72 y.o. female.                       Chief Complaint: Chest pain  HPI: 72 years old black female with PMH of  COPD, CAD, diabetes, hypertension, hyperlipidemia, right breast cancer has sharp, intermittent retrosternal chest pain radiating to her back. EMS gave aspirin and SL NTG. EMS also found pressure difference between two arms. EKG is NSR with possible LVH. Troponin I level is normal. BNP is minimally elevated. CXR is unremarkable. CT chest is positive for aortic atherosclerosis and emphysematous lungs.  Past Medical History:  Diagnosis Date   Ambulates with cane    straight   Angina    no current problems per patient at PAT appt 11/05/18, nitroglycerin sl 06/30/20   Arthritis    HANDS"   Asthma    Breast cancer (HCC)    Cancer (HCC) 09/04/2017   Right Breast   Carpal tunnel syndrome, bilateral 11/26/2016   Cholelithiasis    Cocaine abuse (HCC) 07/2012   per E.R. drug screen   COPD (chronic obstructive pulmonary disease) (HCC)    per patient   Coronary artery disease    Diabetes mellitus without mention of complication    type 2   Dysphagia    esophageal dysmotility on 03/2011 esophagram    Fatty liver disease, nonalcoholic 2009   on Imaging.    GERD (gastroesophageal reflux disease)    Headache    Hearing loss    bilateral - no hearing aids   History of colonic polyps 2009   adenomatous 2009, HP in 2009, 2011, 2013.    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   Wears dentures    full upper and partial lower   Wears glasses       Past Surgical History:  Procedure Laterality Date   ABDOMINAL ANGIOGRAM  02/20/2011   Procedure: ABDOMINAL ANGIOGRAM;  Surgeon: Robynn Pane, MD;  Location: [facility] CATH LAB;  Service: Cardiovascular;;   BREAST BIOPSY Left    CARPAL  TUNNEL RELEASE Right 07/04/2017   Procedure: RIGHT CARPAL TUNNEL RELEASE;  Surgeon: Betha Loa, MD;  Location: [facility];  Service: Orthopedics;  Laterality: Right;   CARPAL TUNNEL RELEASE Left 09/12/2017   Procedure: LEFT CARPAL TUNNEL RELEASE;  Surgeon: Betha Loa, MD;  Location: [facility];  Service: Orthopedics;  Laterality: Left;   CATARACT EXTRACTION     CHOLECYSTECTOMY N/A 11/11/2018   Procedure: ATTEMPTED LAPAROSCOPIC CHOLECUSTECOMY,  OPEN CHOLECYSTECTOMY;  Surgeon: Abigail Miyamoto, MD;  Location: [facility] OR;  Service: General;  Laterality: N/A;   COLONOSCOPY  11/30/2011   Procedure: COLONOSCOPY;  Surgeon: Hart Carwin, MD;  Location: [facility];  Service: Endoscopy;  Laterality: N/A;   CORONARY ANGIOPLASTY WITH STENT PLACEMENT  02/20/2011   ERCP N/A 10/03/2018   Procedure: ENDOSCOPIC RETROGRADE CHOLANGIOPANCREATOGRAPHY (ERCP);  Surgeon: Meryl Dare, MD;  Location: [facility];  Service: Endoscopy;  Laterality: N/A;   ESOPHAGOGASTRODUODENOSCOPY  11/30/2011   Procedure: ESOPHAGOGASTRODUODENOSCOPY (EGD);  Surgeon: Hart Carwin, MD;  Location: [facility];  Service: Endoscopy;  Laterality: N/A;   ESOPHAGOGASTRODUODENOSCOPY N/A 02/23/2015   Procedure: ESOPHAGOGASTRODUODENOSCOPY (EGD);  Surgeon: Iva Boop, MD;  Location: [facility] ENDOSCOPY;  Service: Endoscopy;  Laterality: N/A;   EYE SURGERY Bilateral    cataracts removed   INTRAOPERATIVE CHOLANGIOGRAM N/A 11/11/2018   Procedure: Intraoperative Cholangiogram;  Surgeon: Abigail Miyamoto, MD;  Location: [facility] OR;  Service: General;  Laterality: N/A;   IR REMOVAL TUN ACCESS W/ PORT W/O FL MOD SED  06/30/2020   LEFT HEART CATHETERIZATION WITH CORONARY ANGIOGRAM N/A 02/20/2011   Procedure: LEFT HEART CATHETERIZATION WITH CORONARY ANGIOGRAM;  Surgeon: Robynn Pane, MD;  Location: [facility] CATH LAB;  Service: Cardiovascular;  Laterality: N/A;   MASTECTOMY Right 11/05/2017   MASTECTOMY W/ SENTINEL NODE BIOPSY  Right 11/27/2017   MASTECTOMY W/ SENTINEL NODE BIOPSY Right 11/27/2017   Procedure: RIGHT TOTAL MASTECTOMY WITH SENTINEL LYMPH NODE BIOPSY;  Surgeon: Glenna Fellows, MD;  Location: [facility];  Service: General;  Laterality: Right;   PORT A CATH REVISION Left 05/07/2018   Procedure: PORT A CATH REVISION;  Surgeon: Glenna Fellows, MD;  Location: [facility];  Service: General;  Laterality: Left;   PORTACATH PLACEMENT Left 11/27/2017   Procedure: INSERTION PORT-A-CATH;  Surgeon: Glenna Fellows, MD;  Location: [facility];  Service: General;  Laterality: Left;   REVERSE SHOULDER ARTHROPLASTY Left 03/09/2021   Procedure: REVERSE SHOULDER ARTHROPLASTY;  Surgeon: Francena Hanly, MD;  Location: [facility];  Service: Orthopedics;  Laterality: Left;   RIGHT COLECTOMY  02/2007   for post polypectomy colonic perforation   SPHINCTEROTOMY  10/03/2018   Procedure: SPHINCTEROTOMY;  Surgeon: Meryl Dare, MD;  Location: [facility];  Service: Endoscopy;;   TUBAL LIGATION      Family History  Problem Relation Age of Onset   Heart disease Mother    Diabetes Mother    Cancer Father        unsure what kind   Diabetes Sister    Colon cancer Neg Hx    Breast cancer Neg Hx    Social History:  reports that she quit smoking about 12 years ago. Her smoking use included cigarettes. She started smoking about 59 years ago. She has a 11.8 pack-year smoking history. She has never used smokeless tobacco. She reports that she does not currently use alcohol. She reports that she does not currently use drugs after having used the following drugs: Cocaine.  Allergies: No Known Allergies  (Not in a hospital admission)   Results for orders placed or performed during the hospital encounter of 11/02/22 (from the past 48 hour(s))  CBC with Differential     Status: Abnormal   Collection Time: 11/02/22 10:06 PM  Result Value Ref Range   WBC 12.3 (H) 4.0 - 10.5 K/uL   RBC 3.41 (L) 3.87 - 5.11 MIL/uL   Hemoglobin 10.3 (L)  12.0 - 15.0 g/dL   HCT 16.1 (L) 09.6 - 04.5 %   MCV 94.7 80.0 - 100.0 fL   MCH 30.2 26.0 - 34.0 pg   MCHC 31.9 30.0 - 36.0 g/dL   RDW 40.9 81.1 - 91.4 %   Platelets 319 150 - 400 K/uL   nRBC 0.7 (H) 0.0 - 0.2 %   Neutrophils Relative % 74 %   Neutro Abs 9.0 (H) 1.7 - 7.7 K/uL   Lymphocytes Relative 17 %   Lymphs Abs 2.1 0.7 - 4.0 K/uL   Monocytes Relative 7 %   Monocytes Absolute 0.9 0.1 - 1.0 K/uL   Eosinophils Relative 0 %   Eosinophils Absolute 0.0 0.0 - 0.5 K/uL   Basophils Relative 0 %   Basophils Absolute 0.0 0.0 - 0.1 K/uL   Immature  Granulocytes 2 %   Abs Immature Granulocytes 0.27 (H) 0.00 - 0.07 K/uL    Comment: Performed at [facility], [address] [place], [place] [address]  Comprehensive metabolic panel     Status: Abnormal   Collection Time: 11/02/22 10:06 PM  Result Value Ref Range   Sodium 138 135 - 145 mmol/L   Potassium 4.2 3.5 - 5.1 mmol/L   Chloride 104 98 - 111 mmol/L   CO2 20 (L) 22 - 32 mmol/L   Glucose, Bld 171 (H) 70 - 99 mg/dL    Comment: Glucose reference range applies only to samples taken after fasting for at least 8 hours.   BUN 45 (H) 8 - 23 mg/dL   Creatinine, Ser 6.04 (H) 0.44 - 1.00 mg/dL   Calcium 9.4 8.9 - 54.0 mg/dL   Total Protein 7.8 6.5 - 8.1 g/dL   Albumin 3.7 3.5 - 5.0 g/dL   AST 19 15 - 41 U/L   ALT 17 0 - 44 U/L   Alkaline Phosphatase 106 38 - 126 U/L   Total Bilirubin 0.6 0.3 - 1.2 mg/dL   GFR, Estimated 18 (L) >60 mL/min    Comment: (NOTE) Calculated using the CKD-EPI Creatinine Equation (2021)    Anion gap 14 5 - 15    Comment: Performed at [facility], [address] [place], [place] [address]  Troponin I (High Sensitivity)     Status: None   Collection Time: 11/02/22 10:06 PM  Result Value Ref Range   Troponin I (High Sensitivity) 6 <18 ng/L    Comment: (NOTE) Elevated high sensitivity troponin I (hsTnI) values and significant  changes across serial measurements may suggest ACS but many other   chronic and acute conditions are known to elevate hsTnI results.  Refer to the "Links" section for chest pain algorithms and additional  guidance. Performed at [facility], [address], [place], [place] [address]   Brain natriuretic peptide     Status: Abnormal   Collection Time: 11/02/22 10:06 PM  Result Value Ref Range   B Natriuretic Peptide 124.8 (H) 0.0 - 100.0 pg/mL    Comment: Performed at [facility], [address] [place], [place] [address]   CT Angio Chest/Abd/Pel for Dissection W and/or Wo Contrast  Result Date: 11/02/2022 CLINICAL DATA:  Chest pain with unequal pressures. EXAM: CT ANGIOGRAPHY CHEST, ABDOMEN AND PELVIS TECHNIQUE: Non-contrast CT of the chest was initially obtained. Multidetector CT imaging through the chest, abdomen and pelvis was performed using the standard protocol during bolus administration of intravenous contrast. Multiplanar reconstructed images and MIPs were obtained and reviewed to evaluate the vascular anatomy. RADIATION DOSE REDUCTION: This exam was performed according to the departmental dose-optimization program which includes automated exposure control, adjustment of the mA and/or kV according to patient size and/or use of iterative reconstruction technique. CONTRAST:  80mL OMNIPAQUE IOHEXOL 350 MG/ML SOLN COMPARISON:  CT abdomen and pelvis 08/23/2021.  CT chest 03/22/2018 FINDINGS: CTA CHEST FINDINGS Cardiovascular: Noncontrast images of the chest demonstrate prominent calcification of the aorta, coronary arteries, and great vessel origins. No evidence of intramural hematoma. Images obtained during the arterial phase after intravenous administration of contrast material demonstrate normal caliber thoracic aorta. No aneurysm or dissection. Great vessel origins are patent. Mild cardiac enlargement. No pericardial effusions. Central pulmonary arteries are well opacified. No evidence of significant pulmonary embolus. Mediastinum/Nodes: No  enlarged mediastinal, hilar, or axillary lymph nodes. Thyroid gland, trachea, and esophagus demonstrate  no significant findings. Lungs/Pleura: Motion artifact limits examination. Probable atelectasis in the lung bases. Emphysematous changes. No focal consolidation or edema. No pleural effusion or pneumothorax. Musculoskeletal: Postoperative changes in the left shoulder. Degenerative changes in the spine. No acute bony abnormalities. Review of the MIP images confirms the above findings. CTA ABDOMEN AND PELVIS FINDINGS VASCULAR Aorta: Severe calcification throughout the abdominal aorta. No aneurysm or dissection. Several focal areas of low-grade to moderate stenosis are present, largest representing about 50% diameter reduction. Celiac: Patent without evidence of aneurysm, dissection, vasculitis or significant stenosis. SMA: Patent without evidence of aneurysm, dissection, vasculitis or significant stenosis. Renals: Both renal arteries are patent without evidence of aneurysm, dissection, vasculitis, fibromuscular dysplasia or significant stenosis. IMA: Patent without evidence of aneurysm, dissection, vasculitis or significant stenosis. Inflow: Patent without evidence of aneurysm, dissection, vasculitis or significant stenosis. Veins: No obvious venous abnormality within the limitations of this arterial phase study. Review of the MIP images confirms the above findings. NON-VASCULAR Hepatobiliary: Gallbladder appears surgically absent. No focal liver lesions. Pancreas: Unremarkable. No pancreatic ductal dilatation or surrounding inflammatory changes. Spleen: Normal in size without focal abnormality. Adrenals/Urinary Tract: Adrenal glands are unremarkable. Kidneys are normal, without renal calculi, focal lesion, or hydronephrosis. Bladder is unremarkable. Stomach/Bowel: Stomach, small bowel, and colon are not abnormally distended. No wall thickening or inflammatory changes are seen. Appendix is not identified. Lymphatic:  No significant lymphadenopathy. Reproductive: Limited visualization.  No pelvic mass identified. Other: Small bilateral inguinal hernias containing fat. No free air or free fluid. Musculoskeletal: Degenerative changes in the spine. No acute bony abnormalities. Review of the MIP images confirms the above findings. IMPRESSION: 1. Extensive calcific atherosclerotic changes in the thoracic and abdominal aorta. No evidence of aortic aneurysm or dissection. 2. No evidence of active pulmonary disease. Atelectasis in the lung bases. Emphysematous changes. For 3 no acute process demonstrated in the abdomen or pelvis. No evidence of bowel obstruction or inflammation. 3. Small bilateral inguinal hernias containing fat. Electronically Signed   By: Burman Nieves M.D.   On: 11/02/2022 22:42   DG Chest Portable 1 View  Result Date: 11/02/2022 CLINICAL DATA:  Chest pain EXAM: PORTABLE CHEST 1 VIEW COMPARISON:  04/27/2022 FINDINGS: Mild cardiomegaly. No confluent opacities, effusions or edema. No acute bony abnormality. IMPRESSION: Cardiomegaly.  No active disease. Electronically Signed   By: Charlett Nose M.D.   On: 11/02/2022 22:28    Review Of Systems Constitutional: No fever, chills, weight loss or gain. Eyes: No vision change, wears glasses. No discharge or pain. Ears: No hearing loss, No tinnitus. Respiratory: No asthma, positive COPD, pneumonias, shortness of breath. No hemoptysis. Cardiovascular: Positive chest pain, nopalpitation, leg edema. Gastrointestinal: No nausea, vomiting, diarrhea, constipation. No GI bleed. No hepatitis. Genitourinary: No dysuria, hematuria, kidney stone. No incontinance. Neurological: No headache, stroke, seizures.  Psychiatry: No psych facility admission for anxiety, depression, suicide. No detox. Skin: No rash. Musculoskeletal: Positive joint pain, fibromyalgia. No neck pain, back pain. Lymphadenopathy: No lymphadenopathy. Hematology: No anemia or easy bruising.   Blood  pressure (!) 141/52, pulse 75, temperature 98.2 F (36.8 C), temperature source Oral, resp. rate 18, height 4\' 11"  (1.499 m), weight 58 kg, SpO2 100%. Body mass index is 25.83 kg/m. General appearance: alert, cooperative, appears stated age and no distress Head: Normocephalic, atraumatic. Eyes: Brown eyes, pink conjunctiva, corneas clear. PERRL, EOM's intact. Neck: No adenopathy, no carotid bruit, no JVD, supple, symmetrical, trachea midline and thyroid not enlarged. Chest: Right breast mastectomy scar. Non-tender on palpation. Resp: Clear  to auscultation bilaterally. Cardio: Regular rate and rhythm, S1, S2 normal, II/VI systolic murmur, no click, rub or gallop GI: Soft, non-tender; bowel sounds normal; no organomegaly. Extremities: No edema, cyanosis or clubbing. Skin: Warm and dry.  Neurologic: Alert and oriented X 3, normal strength. Normal coordination.  Assessment/Plan Acute coronary syndrome HTN Type 2 DM S/P breast cancer and surgery-2019 Aortic atherosclerosis  Plan: Admit. R/O MI Echocardiogram for LV function. NM stress test v/s cardiac cath. Continue home medications Add Protonix 40 mg. IV daily.  Time spent: Review of old records, Lab, x-rays, EKG, other cardiac tests, examination, discussion with patient/ER doctor over 70 minutes.  Ricki Rodriguez, MD  11/03/2022, 12:41 AM

## 2022-11-03 NOTE — ED Notes (Signed)
NSR on cardiac monitor, NAD noted at this time, respirations even and unlabored, skin warm and dry.

## 2022-11-03 NOTE — ED Notes (Signed)
ED TO INPATIENT HANDOFF REPORT  ED Nurse Name and Phone #: [phone]  S Name/Age/Gender [name] 72 y.o. female Room/Bed: 005C/005C  Code Status   Code Status: Full Code  Home/SNF/Other Home Patient oriented to: self, place, time, and situation Is this baseline? Yes   Triage Complete: Triage complete  Chief Complaint Acute coronary syndrome [facility]) [I24.9]  Triage Note Pt from home c/o 2 day hx of central chest pain radiating to left with associated SOB. EMS report 40 point difference between L & R arm BP   Allergies No Known Allergies  Level of Care/Admitting Diagnosis ED Disposition     ED Disposition  Admit   Condition  --   Comment  Hospital Area: [facility] [[id number]]  Level of Care: Telemetry Cardiac [[id number]]  May place patient in observation at [facility] or [facility] if equivalent level of care is available:: No  Covid Evaluation: Asymptomatic - no recent exposure (last 10 days) testing not required  Diagnosis: Acute coronary syndrome [facility]) [[id number]]  Admitting Physician: Orpah Cobb [[id number]]  Attending Physician: Orpah Cobb [[id number]]          B Medical/Surgery History Past Medical History:  Diagnosis Date   Ambulates with cane    straight   Angina    no current problems per patient at PAT appt 11/05/18, nitroglycerin sl 06/30/20   Arthritis    HANDS"   Asthma    Breast cancer (HCC)    Cancer (HCC) 09/04/2017   Right Breast   Carpal tunnel syndrome, bilateral 11/26/2016   Cholelithiasis    Cocaine abuse (HCC) 07/2012   per E.R. drug screen   COPD (chronic obstructive pulmonary disease) (HCC)    per patient   Coronary artery disease    Diabetes mellitus without mention of complication    type 2   Dysphagia    esophageal dysmotility on 03/2011 esophagram    Fatty liver disease, nonalcoholic 2009   on Imaging.    GERD (gastroesophageal reflux disease)    Headache    Hearing loss    bilateral - no hearing aids    History of colonic polyps 2009   adenomatous 2009, HP in 2009, 2011, 2013.    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   Wears dentures    full upper and partial lower   Wears glasses    Past Surgical History:  Procedure Laterality Date   ABDOMINAL ANGIOGRAM  02/20/2011   Procedure: ABDOMINAL ANGIOGRAM;  Surgeon: Robynn Pane, MD;  Location: [facility] CATH LAB;  Service: Cardiovascular;;   BREAST BIOPSY Left    CARPAL TUNNEL RELEASE Right 07/04/2017   Procedure: RIGHT CARPAL TUNNEL RELEASE;  Surgeon: Betha Loa, MD;  Location: [facility];  Service: Orthopedics;  Laterality: Right;   CARPAL TUNNEL RELEASE Left 09/12/2017   Procedure: LEFT CARPAL TUNNEL RELEASE;  Surgeon: Betha Loa, MD;  Location: [facility];  Service: Orthopedics;  Laterality: Left;   CATARACT EXTRACTION     CHOLECYSTECTOMY N/A 11/11/2018   Procedure: ATTEMPTED LAPAROSCOPIC CHOLECUSTECOMY,  OPEN CHOLECYSTECTOMY;  Surgeon: Abigail Miyamoto, MD;  Location: [facility] OR;  Service: General;  Laterality: N/A;   COLONOSCOPY  11/30/2011   Procedure: COLONOSCOPY;  Surgeon: Hart Carwin, MD;  Location: [facility] END[facility];  Service: Endoscopy;  Laterality:  N/A;   CORONARY ANGIOPLASTY WITH STENT PLACEMENT  02/20/2011   ERCP N/A 10/03/2018   Procedure: ENDOSCOPIC RETROGRADE CHOLANGIOPANCREATOGRAPHY (ERCP);  Surgeon: Meryl Dare, MD;  Location: [facility];  Service: Endoscopy;  Laterality: N/A;   ESOPHAGOGASTRODUODENOSCOPY  11/30/2011   Procedure: ESOPHAGOGASTRODUODENOSCOPY (EGD);  Surgeon: Hart Carwin, MD;  Location: [facility];  Service: Endoscopy;  Laterality: N/A;   ESOPHAGOGASTRODUODENOSCOPY N/A 02/23/2015   Procedure: ESOPHAGOGASTRODUODENOSCOPY (EGD);  Surgeon: Iva Boop, MD;  Location: [facility];  Service: Endoscopy;  Laterality: N/A;   EYE  SURGERY Bilateral    cataracts removed   INTRAOPERATIVE CHOLANGIOGRAM N/A 11/11/2018   Procedure: Intraoperative Cholangiogram;  Surgeon: Abigail Miyamoto, MD;  Location: [facility];  Service: General;  Laterality: N/A;   IR REMOVAL TUN ACCESS W/ PORT W/O FL MOD SED  06/30/2020   LEFT HEART CATHETERIZATION WITH CORONARY ANGIOGRAM N/A 02/20/2011   Procedure: LEFT HEART CATHETERIZATION WITH CORONARY ANGIOGRAM;  Surgeon: Robynn Pane, MD;  [facility]: [facility];  Service: Cardiovascular;  Laterality: N/A;   MASTECTOMY Right 11/05/2017   MASTECTOMY W/ SENTINEL NODE BIOPSY Right 11/27/2017   MASTECTOMY W/ SENTINEL NODE BIOPSY Right 11/27/2017   Procedure: RIGHT TOTAL MASTECTOMY WITH SENTINEL LYMPH NODE BIOPSY;  Surgeon: Glenna Fellows, MD;  Location[facility];  Service: General;  Laterality: Right;   PORT A CATH REVISION Left 05/07/2018   Procedure: PORT A CATH REVISION;  Surgeon: Glenna Fellows, MD;  Location: [facility]S;  Service: General;  Laterality: Left;   PORTACATH PLACEMENT Left 11/27/2017   Procedure: INSERTION PORT-A-CATH;  Surgeon: Glenna Fellows, MD;  Location: [facility];  Service: General;  Laterality: Left;   REVERSE SHOULDER ARTHROPLASTY Left 03/09/2021   Procedure: REVERSE SHOULDER ARTHROPLASTY;  Surgeon: Francena Hanly, MD;  Location: [facility];  Service: Orthopedics;  Laterality: Left;   RIGHT COLECTOMY  02/2007   for post polypectomy colonic perforation   SPHINCTEROTOMY  10/03/2018   Procedure: SPHINCTEROTOMY;  Surgeon: Meryl Dare, MD;  Location: [facility];  Service: Endoscopy;;   TUBAL LIGATION       A IV Location/Drains/Wounds Patient Lines/Drains/Airways Status     Active Line/Drains/Airways     Name Placement date Placement time Site Days   Peripheral IV 11/02/22 20 G Left Antecubital 11/02/22  2100  Antecubital  1            Intake/Output Last 24 hours No intake or output data in the 24 hours ending 11/03/22 1021  Labs/Imaging Results for orders  placed or performed during the hospital encounter of 11/02/22 (from the past 48 hour(s))  CBC with Differential     Status: Abnormal   Collection Time: 11/02/22 10:06 PM  Result Value Ref Range   WBC 12.3 (H) 4.0 - 10.5 K/uL   RBC 3.41 (L) 3.87 - 5.11 MIL/uL   Hemoglobin 10.3 (L) 12.0 - 15.0 g/dL   HCT 16.1 (L) 09.6 - 04.5 %   MCV 94.7 80.0 - 100.0 fL   MCH 30.2 26.0 - 34.0 pg   MCHC 31.9 30.0 - 36.0 g/dL   RDW 40.9 81.1 - 91.4 %   Platelets 319 150 - 400 K/uL   nRBC 0.7 (H) 0.0 - 0.2 %   Neutrophils Relative % 74 %   Neutro Abs 9.0 (H) 1.7 - 7.7 K/uL   Lymphocytes Relative 17 %   Lymphs Abs 2.1 0.7 - 4.0 K/uL   Monocytes Relative 7 %   Monocytes Absolute 0.9 0.1 - 1.0 K/uL   Eosinophils Relative 0 %  Eosinophils Absolute 0.0 0.0 - 0.5 K/uL   Basophils Relative 0 %   Basophils Absolute 0.0 0.0 - 0.1 K/uL   Immature Granulocytes 2 %   Abs Immature Granulocytes 0.27 (H) 0.00 - 0.07 K/uL    Comment: Performed at [facility], [address] [place], [place] [address]  Comprehensive metabolic panel     Status: Abnormal   Collection Time: 11/02/22 10:06 PM  Result Value Ref Range   Sodium 138 135 - 145 mmol/L   Potassium 4.2 3.5 - 5.1 mmol/L   Chloride 104 98 - 111 mmol/L   CO2 20 (L) 22 - 32 mmol/L   Glucose, Bld 171 (H) 70 - 99 mg/dL    Comment: Glucose reference range applies only to samples taken after fasting for at least 8 hours.   BUN 45 (H) 8 - 23 mg/dL   Creatinine, Ser 7.37 (H) 0.44 - 1.00 mg/dL   Calcium 9.4 8.9 - 10.6 mg/dL   Total Protein 7.8 6.5 - 8.1 g/dL   Albumin 3.7 3.5 - 5.0 g/dL   AST 19 15 - 41 U/L   ALT 17 0 - 44 U/L   Alkaline Phosphatase 106 38 - 126 U/L   Total Bilirubin 0.6 0.3 - 1.2 mg/dL   GFR, Estimated 18 (L) >60 mL/min    Comment: (NOTE) Calculated using the CKD-EPI Creatinine Equation (2021)    Anion gap 14 5 - 15    Comment: Performed at [facility], [address] [place], [place] [address]  Troponin I (High Sensitivity)      Status: None   Collection Time: 11/02/22 10:06 PM  Result Value Ref Range   Troponin I (High Sensitivity) 6 <18 ng/L    Comment: (NOTE) Elevated high sensitivity troponin I (hsTnI) values and significant  changes across serial measurements may suggest ACS but many other  chronic and acute conditions are known to elevate hsTnI results.  Refer to the "Links" section for chest pain algorithms and additional  guidance. Performed at [facility], [address], [place], [place] [address]   Brain natriuretic peptide     Status: Abnormal   Collection Time: 11/02/22 10:06 PM  Result Value Ref Range   B Natriuretic Peptide 124.8 (H) 0.0 - 100.0 pg/mL    Comment: Performed at [facility], [address], [place], [place] [address]  Basic metabolic panel     Status: Abnormal   Collection Time: 11/03/22  3:51 AM  Result Value Ref Range   Sodium 135 135 - 145 mmol/L   Potassium 4.6 3.5 - 5.1 mmol/L   Chloride 104 98 - 111 mmol/L   CO2 20 (L) 22 - 32 mmol/L   Glucose, Bld 248 (H) 70 - 99 mg/dL    Comment: Glucose reference range applies only to samples taken after fasting for at least 8 hours.   BUN 45 (H) 8 - 23 mg/dL   Creatinine, Ser 9.38 (H) 0.44 - 1.00 mg/dL   Calcium 9.1 8.9 - 18.2 mg/dL   GFR, Estimated 19 (L) >60 mL/min    Comment: (NOTE) Calculated using the CKD-EPI Creatinine Equation (2021)    Anion gap 11 5 - 15    Comment: Performed at [facility], [address] [place], [place] [address]  CBC     Status: Abnormal   Collection Time: 11/03/22  3:51 AM  Result Value Ref Range   WBC 10.8 (H) 4.0 - 10.5 K/uL  RBC 3.33 (L) 3.87 - 5.11 MIL/uL   Hemoglobin 10.4 (L) 12.0 - 15.0 g/dL   HCT 22.0 (L) 25.4 - 27.0 %   MCV 96.7 80.0 - 100.0 fL   MCH 31.2 26.0 - 34.0 pg   MCHC 32.3 30.0 - 36.0 g/dL   RDW 62.3 76.2 - 83.1 %   Platelets 304 150 - 400 K/uL   nRBC 0.5 (H) 0.0 - 0.2 %    Comment: Performed at [facility], [address], [place],  [place] [address]  Lipid panel     Status: None   Collection Time: 11/03/22  3:51 AM  Result Value Ref Range   Cholesterol 132 0 - 200 mg/dL   Triglycerides 50 <607 mg/dL   HDL 50 >37 mg/dL   Total CHOL/HDL Ratio 2.6 RATIO   VLDL 10 0 - 40 mg/dL   LDL Cholesterol 72 0 - 99 mg/dL    Comment:        Total Cholesterol/HDL:CHD Risk Coronary Heart Disease Risk Table                     Men   Women  1/2 Average Risk   3.4   3.3  Average Risk       5.0   4.4  2 X Average Risk   9.6   7.1  3 X Average Risk  23.4   11.0        Use the calculated Patient Ratio above and the CHD Risk Table to determine the patient's CHD Risk.        ATP III CLASSIFICATION (LDL):  <100     mg/dL   Optimal  106-269  mg/dL   Near or Above                    Optimal  130-159  mg/dL   Borderline  485-462  mg/dL   High  >703     mg/dL   Very High Performed at [facility], [address] [place], [place] [address]   Troponin I (High Sensitivity)     Status: None   Collection Time: 11/03/22  3:51 AM  Result Value Ref Range   Troponin I (High Sensitivity) 7 <18 ng/L    Comment: (NOTE) Elevated high sensitivity troponin I (hsTnI) values and significant  changes across serial measurements may suggest ACS but many other  chronic and acute conditions are known to elevate hsTnI results.  Refer to the "Links" section for chest pain algorithms and additional  guidance. Performed at [facility], [address], [place], [place] [address]    CT Angio Chest/Abd/Pel for Dissection W and/or Wo Contrast  Result Date: 11/02/2022 CLINICAL DATA:  Chest pain with unequal pressures. EXAM: CT ANGIOGRAPHY CHEST, ABDOMEN AND PELVIS TECHNIQUE: Non-contrast CT of the chest was initially obtained. Multidetector CT imaging through the chest, abdomen and pelvis was performed using the standard protocol during bolus administration of intravenous contrast. Multiplanar reconstructed images and MIPs were obtained and reviewed to  evaluate the vascular anatomy. RADIATION DOSE REDUCTION: This exam was performed according to the departmental dose-optimization program which includes automated exposure control, adjustment of the mA and/or kV according to patient size and/or use of iterative reconstruction technique. CONTRAST:  80mL OMNIPAQUE IOHEXOL 350 MG/ML SOLN COMPARISON:  CT abdomen and pelvis 08/23/2021.  CT chest 03/22/2018 FINDINGS: CTA CHEST FINDINGS Cardiovascular: Noncontrast images of the chest demonstrate prominent calcification of the aorta, coronary  arteries, and great vessel origins. No evidence of intramural hematoma. Images obtained during the arterial phase after intravenous administration of contrast material demonstrate normal caliber thoracic aorta. No aneurysm or dissection. Great vessel origins are patent. Mild cardiac enlargement. No pericardial effusions. Central pulmonary arteries are well opacified. No evidence of significant pulmonary embolus. Mediastinum/Nodes: No enlarged mediastinal, hilar, or axillary lymph nodes. Thyroid gland, trachea, and esophagus demonstrate no significant findings. Lungs/Pleura: Motion artifact limits examination. Probable atelectasis in the lung bases. Emphysematous changes. No focal consolidation or edema. No pleural effusion or pneumothorax. Musculoskeletal: Postoperative changes in the left shoulder. Degenerative changes in the spine. No acute bony abnormalities. Review of the MIP images confirms the above findings. CTA ABDOMEN AND PELVIS FINDINGS VASCULAR Aorta: Severe calcification throughout the abdominal aorta. No aneurysm or dissection. Several focal areas of low-grade to moderate stenosis are present, largest representing about 50% diameter reduction. Celiac: Patent without evidence of aneurysm, dissection, vasculitis or significant stenosis. SMA: Patent without evidence of aneurysm, dissection, vasculitis or significant stenosis. Renals: Both renal arteries are patent without  evidence of aneurysm, dissection, vasculitis, fibromuscular dysplasia or significant stenosis. IMA: Patent without evidence of aneurysm, dissection, vasculitis or significant stenosis. Inflow: Patent without evidence of aneurysm, dissection, vasculitis or significant stenosis. Veins: No obvious venous abnormality within the limitations of this arterial phase study. Review of the MIP images confirms the above findings. NON-VASCULAR Hepatobiliary: Gallbladder appears surgically absent. No focal liver lesions. Pancreas: Unremarkable. No pancreatic ductal dilatation or surrounding inflammatory changes. Spleen: Normal in size without focal abnormality. Adrenals/Urinary Tract: Adrenal glands are unremarkable. Kidneys are normal, without renal calculi, focal lesion, or hydronephrosis. Bladder is unremarkable. Stomach/Bowel: Stomach, small bowel, and colon are not abnormally distended. No wall thickening or inflammatory changes are seen. Appendix is not identified. Lymphatic: No significant lymphadenopathy. Reproductive: Limited visualization.  No pelvic mass identified. Other: Small bilateral inguinal hernias containing fat. No free air or free fluid. Musculoskeletal: Degenerative changes in the spine. No acute bony abnormalities. Review of the MIP images confirms the above findings. IMPRESSION: 1. Extensive calcific atherosclerotic changes in the thoracic and abdominal aorta. No evidence of aortic aneurysm or dissection. 2. No evidence of active pulmonary disease. Atelectasis in the lung bases. Emphysematous changes. For 3 no acute process demonstrated in the abdomen or pelvis. No evidence of bowel obstruction or inflammation. 3. Small bilateral inguinal hernias containing fat. Electronically Signed   By: Burman Nieves M.D.   On: 11/02/2022 22:42   DG Chest Portable 1 View  Result Date: 11/02/2022 CLINICAL DATA:  Chest pain EXAM: PORTABLE CHEST 1 VIEW COMPARISON:  04/27/2022 FINDINGS: Mild cardiomegaly. No  confluent opacities, effusions or edema. No acute bony abnormality. IMPRESSION: Cardiomegaly.  No active disease. Electronically Signed   By: Charlett Nose M.D.   On: 11/02/2022 22:28    Pending Labs Unresulted Labs (From admission, onward)     Start     Ordered   11/03/22 1100  Heparin level (unfractionated)  Once-Timed,   TIMED        11/03/22 1007   11/03/22 0500  Lipoprotein A (LPA)  Tomorrow morning,   R        11/03/22 0040            Vitals/Pain Today's Vitals   11/03/22 0845 11/03/22 0856 11/03/22 0857 11/03/22 1015  BP: (!) 166/75   (!) 129/96  Pulse: 71 70 70 80  Resp: (!) 9 10 13  (!) 22  Temp:      TempSrc:  SpO2: 96% 96% 96% 99%  Weight:      Height:      PainSc:        Isolation Precautions No active isolations  Medications Medications  aspirin EC tablet 81 mg (has no administration in time range)  nitroGLYCERIN (NITROSTAT) SL tablet 0.4 mg (has no administration in time range)  acetaminophen (TYLENOL) tablet 650 mg (has no administration in time range)  ondansetron (ZOFRAN) injection 4 mg (has no administration in time range)  0.9 %  sodium chloride infusion ( Intravenous New Bag/Given 11/03/22 0304)  heparin ADULT infusion 100 units/mL (25000 units/267mL) (600 Units/hr Intravenous New Bag/Given 11/03/22 0252)  oxyCODONE (Oxy IR/ROXICODONE) immediate release tablet 5 mg (5 mg Oral Given 11/03/22 0246)  albuterol (PROVENTIL) (2.5 MG/3ML) 0.083% nebulizer solution 2.5 mg (has no administration in time range)  amLODipine (NORVASC) tablet 10 mg (has no administration in time range)  anastrozole (ARIMIDEX) tablet 1 mg (has no administration in time range)  atorvastatin (LIPITOR) tablet 20 mg (has no administration in time range)  escitalopram (LEXAPRO) tablet 10 mg (has no administration in time range)  fluticasone furoate-vilanterol (BREO ELLIPTA) 200-25 MCG/ACT 1 puff (has no administration in time range)  gabapentin (NEURONTIN) capsule 100 mg (has no  administration in time range)  insulin glargine-yfgn (SEMGLEE) injection 5 Units (has no administration in time range)  liraglutide (VICTOZA) SOPN 1.8 mg (has no administration in time range)  metoprolol succinate (TOPROL-XL) 24 hr tablet 50 mg (has no administration in time range)  multivitamin with minerals tablet 1 tablet (has no administration in time range)  Fish Oil CAPS 1,000 mg (has no administration in time range)  pantoprazole (PROTONIX) EC tablet 40 mg (has no administration in time range)  iohexol (OMNIPAQUE) 350 MG/ML injection 80 mL (80 mLs Intravenous Contrast Given 11/02/22 2223)  acetaminophen (TYLENOL) tablet 1,000 mg (1,000 mg Oral Given 11/02/22 2319)  heparin bolus via infusion 3,000 Units (3,000 Units Intravenous Bolus from Bag 11/03/22 0253)    Mobility walks     Focused Assessments    R Recommendations: See Admitting Provider Note  Report given to:   Additional Notes:

## 2022-11-03 NOTE — Plan of Care (Signed)
Will continue to monitor patient.

## 2022-11-03 NOTE — Progress Notes (Signed)
Messages pharmacy regarding medications and retime. They stated it will happen and be re-timed. Also awaiting MD about patient awaiting response at this time.

## 2022-11-03 NOTE — Progress Notes (Signed)
Ref: Vladimir Crofts, FNP   Subjective:  Awake. CP continues. Troponin I levels are normal x 2. Lipid panel is near normal. Awaiting echocardiogram.  Objective:  Vital Signs in the last 24 hours: Temp:  [98 F (36.7 C)-98.6 F (37 C)] 98 F (36.7 C) (09/28 0351) Pulse Rate:  [63-82] 80 (09/28 1015) Cardiac Rhythm: Normal sinus rhythm (09/27 2227) Resp:  [9-85] 22 (09/28 1015) BP: (78-169)/(40-97) 129/96 (09/28 1015) SpO2:  [94 %-100 %] 99 % (09/28 1015) Weight:  [58 kg] 58 kg (09/27 2205)  Physical Exam: BP Readings from Last 1 Encounters:  11/03/22 (!) 129/96     Wt Readings from Last 1 Encounters:  11/02/22 58 kg    Weight change:  Body mass index is 25.83 kg/m. HEENT: [place]/AT, Eyes-Brown, Conjunctiva-Pink, Sclera-Non-icteric Neck: No JVD, No bruit, Trachea midline. Lungs:  Clear, Bilateral. Cardiac:  Regular rhythm, normal S1 and S2, no S3. III/VI systolic murmur. Abdomen:  Soft, non-tender. BS present. Extremities:  No edema present. No cyanosis. No clubbing. CNS: AxOx3, Cranial nerves grossly intact, moves all 4 extremities.  Skin: Warm and dry.   Intake/Output from previous day: No intake/output data recorded.    Lab Results: BMET    Component Value Date/Time   NA 135 11/03/2022 0351   NA 138 11/02/2022 2206   NA 139 10/22/2022 0832   NA 141 05/10/2021 1104   NA 140 10/13/2020 0904   NA 142 03/15/2020 1154   K 4.6 11/03/2022 0351   K 4.2 11/02/2022 2206   K 3.5 10/22/2022 0832   CL 104 11/03/2022 0351   CL 104 11/02/2022 2206   CL 110 10/22/2022 0832   CO2 20 (L) 11/03/2022 0351   CO2 20 (L) 11/02/2022 2206   CO2 22 10/22/2022 0832   GLUCOSE 248 (H) 11/03/2022 0351   GLUCOSE 171 (H) 11/02/2022 2206   GLUCOSE 131 (H) 10/22/2022 0832   BUN 45 (H) 11/03/2022 0351   BUN 45 (H) 11/02/2022 2206   BUN 35 (H) 10/22/2022 0832   BUN 25 05/10/2021 1104   BUN 26 10/13/2020 0904   BUN 16 03/15/2020 1154   CREATININE 2.62 (H) 11/03/2022 0351   CREATININE  2.65 (H) 11/02/2022 2206   CREATININE 2.79 (H) 10/22/2022 0832   CREATININE 2.32 (H) 04/27/2022 1349   CREATININE 2.57 (H) 03/23/2022 0933   CREATININE 2.36 (H) 09/13/2021 1124   CALCIUM 9.1 11/03/2022 0351   CALCIUM 9.4 11/02/2022 2206   CALCIUM 9.2 10/22/2022 0832   GFRNONAA 19 (L) 11/03/2022 0351   GFRNONAA 18 (L) 11/02/2022 2206   GFRNONAA 17 (L) 10/22/2022 0832   GFRNONAA 22 (L) 04/27/2022 1349   GFRNONAA 19 (L) 03/23/2022 0933   GFRNONAA 21 (L) 09/13/2021 1124   GFRAA 36 (L) 03/15/2020 1154   GFRAA 37 (L) 09/11/2019 1009   GFRAA 25 (L) 01/29/2019 1039   GFRAA 37 (L) 12/09/2018 0316   GFRAA 30 (L) 11/27/2018 1243   GFRAA 31 (L) 11/06/2018 1337   CBC    Component Value Date/Time   WBC 10.8 (H) 11/03/2022 0351   RBC 3.33 (L) 11/03/2022 0351   HGB 10.4 (L) 11/03/2022 0351   HGB 10.0 (L) 10/22/2022 0832   HGB 11.6 05/10/2021 1104   HCT 32.2 (L) 11/03/2022 0351   HCT 36.3 05/10/2021 1104   PLT 304 11/03/2022 0351   PLT 257 10/22/2022 0832   PLT 300 05/10/2021 1104   MCV 96.7 11/03/2022 0351   MCV 93 05/10/2021 1104   MCH 31.2  11/03/2022 0351   MCHC 32.3 11/03/2022 0351   RDW 14.9 11/03/2022 0351   RDW 13.5 05/10/2021 1104   LYMPHSABS 2.1 11/02/2022 2206   MONOABS 0.9 11/02/2022 2206   EOSABS 0.0 11/02/2022 2206   BASOSABS 0.0 11/02/2022 2206   HEPATIC Function Panel Recent Labs    04/27/22 1349 10/22/22 0832 11/02/22 2206  PROT 8.2* 8.2* 7.8  ALBUMIN 3.8 4.1 3.7  AST 19 14* 19  ALT 17 9 17   ALKPHOS 123 135* 106   HEMOGLOBIN A1C Lab Results  Component Value Date   MPG 122.63 02/08/2021   CARDIAC ENZYMES Lab Results  Component Value Date   CKTOTAL 47 05/07/2011   CKMB 1.3 05/07/2011   TROPONINI <0.03 07/13/2018   TROPONINI <0.03 07/12/2018   TROPONINI <0.03 07/12/2018   BNP No results for input(s): "PROBNP" in the last 8760 hours. TSH No results for input(s): "TSH" in the last 8760 hours. CHOLESTEROL Recent Labs    11/03/22 0351  CHOL 132     Scheduled Meds:  amLODipine  10 mg Oral Daily   anastrozole  1 mg Oral Daily   [START ON 11/04/2022] aspirin EC  81 mg Oral Daily   atorvastatin  20 mg Oral Daily   escitalopram  10 mg Oral Daily   Fish Oil  1,000 mg Oral Daily   fluticasone furoate-vilanterol  1 puff Inhalation Daily   gabapentin  100 mg Oral TID   insulin glargine-yfgn  5 Units Subcutaneous Daily   liraglutide  1.8 mg Subcutaneous Daily   metoprolol succinate  50 mg Oral Daily   multivitamin with minerals  1 tablet Oral Daily   pantoprazole  40 mg Oral Daily   Continuous Infusions:  sodium chloride 50 mL/hr at 11/03/22 0304   heparin 600 Units/hr (11/03/22 0252)   PRN Meds:.acetaminophen, albuterol, nitroGLYCERIN, ondansetron (ZOFRAN) IV, oxyCODONE  Assessment/Plan: Acute coronary syndrome HTN Type 2 DM S/P right breast cancer and surgery-2019 Aortic atherosclerosis  Plan: Resume home medications. Change Protonix to PO. Check echocardiogram when available.    LOS: 0 days   Time spent including chart review, lab review, examination, discussion with patient : 30 min   Orpah Cobb  MD  11/03/2022, 10:27 AM

## 2022-11-03 NOTE — Progress Notes (Signed)
Spoke to MD about pain medications and stated her pain was substernal and to give the PO pain medications. Will continue to monitor patient.

## 2022-11-04 ENCOUNTER — Other Ambulatory Visit: Payer: Self-pay

## 2022-11-04 DIAGNOSIS — Z7982 Long term (current) use of aspirin: Secondary | ICD-10-CM | POA: Diagnosis not present

## 2022-11-04 DIAGNOSIS — I129 Hypertensive chronic kidney disease with stage 1 through stage 4 chronic kidney disease, or unspecified chronic kidney disease: Secondary | ICD-10-CM | POA: Diagnosis present

## 2022-11-04 DIAGNOSIS — K760 Fatty (change of) liver, not elsewhere classified: Secondary | ICD-10-CM | POA: Diagnosis present

## 2022-11-04 DIAGNOSIS — I700 Atherosclerosis of aorta: Secondary | ICD-10-CM | POA: Diagnosis present

## 2022-11-04 DIAGNOSIS — I2510 Atherosclerotic heart disease of native coronary artery without angina pectoris: Secondary | ICD-10-CM | POA: Diagnosis present

## 2022-11-04 DIAGNOSIS — Z955 Presence of coronary angioplasty implant and graft: Secondary | ICD-10-CM | POA: Diagnosis not present

## 2022-11-04 DIAGNOSIS — D631 Anemia in chronic kidney disease: Secondary | ICD-10-CM | POA: Diagnosis present

## 2022-11-04 DIAGNOSIS — M109 Gout, unspecified: Secondary | ICD-10-CM | POA: Diagnosis present

## 2022-11-04 DIAGNOSIS — Z9049 Acquired absence of other specified parts of digestive tract: Secondary | ICD-10-CM | POA: Diagnosis not present

## 2022-11-04 DIAGNOSIS — Z87891 Personal history of nicotine dependence: Secondary | ICD-10-CM | POA: Diagnosis not present

## 2022-11-04 DIAGNOSIS — Z853 Personal history of malignant neoplasm of breast: Secondary | ICD-10-CM | POA: Diagnosis not present

## 2022-11-04 DIAGNOSIS — N184 Chronic kidney disease, stage 4 (severe): Secondary | ICD-10-CM | POA: Diagnosis present

## 2022-11-04 DIAGNOSIS — E785 Hyperlipidemia, unspecified: Secondary | ICD-10-CM | POA: Diagnosis present

## 2022-11-04 DIAGNOSIS — I249 Acute ischemic heart disease, unspecified: Secondary | ICD-10-CM | POA: Diagnosis present

## 2022-11-04 DIAGNOSIS — Z9011 Acquired absence of right breast and nipple: Secondary | ICD-10-CM | POA: Diagnosis not present

## 2022-11-04 DIAGNOSIS — H9193 Unspecified hearing loss, bilateral: Secondary | ICD-10-CM | POA: Diagnosis present

## 2022-11-04 DIAGNOSIS — J4489 Other specified chronic obstructive pulmonary disease: Secondary | ICD-10-CM | POA: Diagnosis present

## 2022-11-04 DIAGNOSIS — K219 Gastro-esophageal reflux disease without esophagitis: Secondary | ICD-10-CM | POA: Diagnosis present

## 2022-11-04 DIAGNOSIS — R0789 Other chest pain: Secondary | ICD-10-CM | POA: Diagnosis present

## 2022-11-04 DIAGNOSIS — M159 Polyosteoarthritis, unspecified: Secondary | ICD-10-CM | POA: Diagnosis present

## 2022-11-04 DIAGNOSIS — E1122 Type 2 diabetes mellitus with diabetic chronic kidney disease: Secondary | ICD-10-CM | POA: Diagnosis present

## 2022-11-04 DIAGNOSIS — I252 Old myocardial infarction: Secondary | ICD-10-CM | POA: Diagnosis not present

## 2022-11-04 DIAGNOSIS — Z794 Long term (current) use of insulin: Secondary | ICD-10-CM | POA: Diagnosis not present

## 2022-11-04 DIAGNOSIS — Z7951 Long term (current) use of inhaled steroids: Secondary | ICD-10-CM | POA: Diagnosis not present

## 2022-11-04 DIAGNOSIS — Z79811 Long term (current) use of aromatase inhibitors: Secondary | ICD-10-CM | POA: Diagnosis not present

## 2022-11-04 LAB — HEPARIN LEVEL (UNFRACTIONATED): Heparin Unfractionated: 0.79 [IU]/mL — ABNORMAL HIGH (ref 0.30–0.70)

## 2022-11-04 LAB — ECHOCARDIOGRAM COMPLETE
AR max vel: 2.7 cm2
AV Peak grad: 8.5 mm[Hg]
Ao pk vel: 1.46 m/s
Area-P 1/2: 3.48 cm2
Height: 59 in
P 1/2 time: 587 ms
S' Lateral: 2.7 cm
Weight: 2045.87 [oz_av]

## 2022-11-04 MED ORDER — HEPARIN SODIUM (PORCINE) 5000 UNIT/ML IJ SOLN
5000.0000 [IU] | Freq: Three times a day (TID) | INTRAMUSCULAR | Status: DC
  Administered 2022-11-04 – 2022-11-05 (×3): 5000 [IU] via SUBCUTANEOUS
  Filled 2022-11-04 (×3): qty 1

## 2022-11-04 NOTE — Progress Notes (Signed)
ANTICOAGULATION CONSULT NOTE - Follow Up Consult  Pharmacy Consult for heparin Indication: chest pain/ACS  Labs: Recent Labs    11/02/22 2206 11/03/22 0351 11/03/22 1005 11/03/22 1741 11/04/22 0411  HGB 10.3* 10.4*  --   --   --   HCT 32.3* 32.2*  --   --   --   PLT 319 304  --   --   --   HEPARINUNFRC  --   --  0.62 0.15* 0.79*  CREATININE 2.65* 2.62*  --   --   --   TROPONINIHS 6 7  --   --   --     Assessment: 72yo female now supratherapeutic on heparin after rate change; no infusion issues or signs of bleeding per RN.  Goal of Therapy:  Heparin level 0.3-0.7 units/ml   Plan:  Decrease heparin infusion by 1-2 units/kg/hr to 700 units/hr. Check level in 8 hours.   Vernard Gambles, PharmD, BCPS 11/04/2022 5:03 AM

## 2022-11-04 NOTE — TOC Initial Note (Signed)
Transition of Care [facility]) - Initial/Assessment Note    Patient Details  Name: [name] MRN: [id number] Date of Birth: 08/14/1950  Transition of Care [facility]) CM/SW Contact:    Ronny Bacon, RN Phone Number: 11/04/2022, 8:13 AM  Clinical Narrative:    Spoke with patient at bedside. Patient from home with son, has RW, BSC and Cane at home. No TOC needs noted at this time.               Expected Discharge Plan: Home/Self Care Barriers to Discharge: Continued Medical Work up   Patient Goals and CMS Choice            Expected Discharge Plan and Services       Living arrangements for the past 2 months: Single Family Home                                      Prior Living Arrangements/Services Living arrangements for the past 2 months: Single Family Home Lives with:: Adult Children Patient language and need for interpreter reviewed:: Yes Do you feel safe going back to the place where you live?: Yes      Need for Family Participation in Patient Care: Yes (Comment) Care giver support system in place?: Yes (comment) Current home services: DME (Cane, RW, [facility]) Criminal Activity/Legal Involvement Pertinent to Current Situation/Hospitalization: No - Comment as needed  Activities of Daily Living   ADL Screening (condition at time of admission) Does the patient have a NEW difficulty with bathing/dressing/toileting/self-feeding that is expected to last >3 days?: No Does the patient have a NEW difficulty with getting in/out of bed, walking, or climbing stairs that is expected to last >3 days?: No Does the patient have a NEW difficulty with communication that is expected to last >3 days?: No Is the patient deaf or have difficulty hearing?: No Does the patient have difficulty seeing, even when wearing glasses/contacts?: Yes Does the patient have difficulty concentrating, remembering, or making decisions?: No  Permission Sought/Granted                  Emotional  Assessment Appearance:: Appears older than stated age Attitude/Demeanor/Rapport: Engaged Affect (typically observed): Appropriate Orientation: : Oriented to Self, Oriented to Place, Oriented to Situation, Oriented to  Time Alcohol / Substance Use: Not Applicable Psych Involvement: No (comment)  Admission diagnosis:  Acute coronary syndrome (HCC) [I24.9] Chest pain in adult [R07.9] Patient Active Problem List   Diagnosis Date Noted   Acute coronary syndrome (HCC) 11/03/2022   S/P reverse total shoulder arthroplasty, 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   MGUS (monoclonal gammopathy of unknown significance) 11/29/2020   ESRD (end stage renal disease) (HCC) 03/15/2020   Insulin dependent type 2 diabetes mellitus (HCC) 12/09/2018   COPD (chronic obstructive pulmonary disease) (HCC) 12/09/2018   Chronic cholecystitis 11/11/2018   Hemobilia    Dilated bile duct    Jaundice    Elevated LFTs    Choledocholithiasis 10/02/2018   CKD (chronic kidney disease), stage III    Acute cholecystitis    Elevated liver enzymes 09/18/2018   Calculus of gallbladder and bile duct with acute cholecystitis, with obstruction    Melena    Unstable angina (HCC) 07/12/2018   Port-A-Cath in place 02/27/2018   Cancer of overlapping sites of right breast (HCC) 11/27/2017   Malignant neoplasm of upper-outer quadrant of right breast in female, estrogen receptor positive (HCC) 10/03/2017  Carpal tunnel syndrome, bilateral 11/26/2016   Ulnar neuropathy at elbow, 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   Abdominal pain, epigastric    Acute on chronic renal failure (HCC) 02/19/2015   Diarrhea    Nausea with vomiting    Hyperglycemia 04/01/2014   Hyperosmolar non-ketotic state in patient with type 2 diabetes mellitus (HCC) 04/01/2014   Nausea vomiting and diarrhea 04/01/2014   CAD (coronary artery disease) 04/01/2014   Dehydration    Diabetic ketoacidosis without coma associated with diabetes mellitus due to underlying condition (HCC)    High anion gap  metabolic acidosis    Cocaine abuse (HCC)    Toxic encephalopathy-Unlikely metabolic,?psychogenic 08/02/2012   Bereavement 02/02/2012   Depression 02/02/2012   Iron deficiency anemia, unspecified 11/30/2011   Nonspecific abnormal finding in stool contents 11/30/2011   Benign neoplasm of colon 11/30/2011   Cough 05/14/2011   Chest pain 03/19/2011   Macrocytic anemia 03/19/2011   Type 2 diabetes mellitus with complication, without long-term current use of insulin (HCC) 03/19/2011   DOE (dyspnea on exertion) 03/19/2011   Chest pain at rest 02/18/2011    Class: Acute   Cholelithiasis 11/01/2010   DM 04/08/2009   HYPERLIPIDEMIA 04/08/2009   HTN (hypertension) 04/08/2009   MYOCARDIAL INFARCTION 04/08/2009   Coronary atherosclerosis 04/08/2009   GERD 04/08/2009   FATTY LIVER DISEASE 04/08/2009   COLONIC POLYPS, HX OF 04/08/2009   PCP:  Vladimir Crofts, FNP Pharmacy:   [id number] - [place] - [address] [place] ([place]) [place] [address] Phone: [phone] Fax: [phone]  [facility] [address], [place] - [address] [phone] [address] [address] Phone: [phone] Fax: [phone]  [facility] - [facility] [address], [address] [place] [address] Phone: [phone] Fax: [phone]  [facility] [place] - [place], [place] - [address] [place] [address] Phone: [phone] Fax: [phone]     Social Determinants of Health (SDOH) Social History: SDOH Screenings   Food Insecurity: Food Insecurity Present (11/03/2022)  Housing: Low Risk  (11/03/2022)  Transportation Needs: No Transportation Needs (11/03/2022)  Utilities: Not At Risk (11/03/2022)  Depression (PHQ2-9): Medium Risk (09/22/2021)  Tobacco Use: Medium Risk (11/03/2022)   SDOH Interventions:     Readmission Risk Interventions     No data to display

## 2022-11-04 NOTE — Progress Notes (Signed)
Ref: Vladimir Crofts, FNP   Subjective:  Chest pain continues. VS stable except low BP post NTG use. Echocardiogram shows normal LV systolic function.  Objective:  Vital Signs in the last 24 hours: Temp:  [97.6 F (36.4 C)-98.1 F (36.7 C)] 97.9 F (36.6 C) (09/29 1120) Pulse Rate:  [68-85] 72 (09/29 1120) Cardiac Rhythm: Normal sinus rhythm (09/29 0720) Resp:  [16-20] 20 (09/29 1120) BP: (92-147)/(48-80) 95/69 (09/29 1120) SpO2:  [95 %-99 %] 95 % (09/29 1120)  Physical Exam: BP Readings from Last 1 Encounters:  11/04/22 95/69     Wt Readings from Last 1 Encounters:  11/02/22 58 kg    Weight change:  Body mass index is 25.83 kg/m. HEENT: [place]/AT, Eyes-Brown,  Conjunctiva-Pink, Sclera-Non-icteric Neck: No JVD, No bruit, Trachea midline. Lungs:  Clear, Bilateral. Cardiac:  Regular rhythm, normal S1 and S2, no S3. II/VI systolic murmur. Abdomen:  Soft, non-tender. BS present. Extremities:  No edema present. No cyanosis. No clubbing. CNS: AxOx3, Cranial nerves grossly intact, moves all 4 extremities.  Skin: Warm and dry.   Intake/Output from previous day: 09/28 0701 - 09/29 0700 In: 1768.6 [P.O.:360; I.V.:1408.6] Out: 900 [Urine:900]    Lab Results: BMET    Component Value Date/Time   NA 135 11/03/2022 0351   NA 138 11/02/2022 2206   NA 139 10/22/2022 0832   NA 141 05/10/2021 1104   NA 140 10/13/2020 0904   NA 142 03/15/2020 1154   K 4.6 11/03/2022 0351   K 4.2 11/02/2022 2206   K 3.5 10/22/2022 0832   CL 104 11/03/2022 0351   CL 104 11/02/2022 2206   CL 110 10/22/2022 0832   CO2 20 (L) 11/03/2022 0351   CO2 20 (L) 11/02/2022 2206   CO2 22 10/22/2022 0832   GLUCOSE 248 (H) 11/03/2022 0351   GLUCOSE 171 (H) 11/02/2022 2206   GLUCOSE 131 (H) 10/22/2022 0832   BUN 45 (H) 11/03/2022 0351   BUN 45 (H) 11/02/2022 2206   BUN 35 (H) 10/22/2022 0832   BUN 25 05/10/2021 1104   BUN 26 10/13/2020 0904   BUN 16 03/15/2020 1154   CREATININE 2.62 (H) 11/03/2022 0351    CREATININE 2.65 (H) 11/02/2022 2206   CREATININE 2.79 (H) 10/22/2022 0832   CREATININE 2.32 (H) 04/27/2022 1349   CREATININE 2.57 (H) 03/23/2022 0933   CREATININE 2.36 (H) 09/13/2021 1124   CALCIUM 9.1 11/03/2022 0351   CALCIUM 9.4 11/02/2022 2206   CALCIUM 9.2 10/22/2022 0832   GFRNONAA 19 (L) 11/03/2022 0351   GFRNONAA 18 (L) 11/02/2022 2206   GFRNONAA 17 (L) 10/22/2022 0832   GFRNONAA 22 (L) 04/27/2022 1349   GFRNONAA 19 (L) 03/23/2022 0933   GFRNONAA 21 (L) 09/13/2021 1124   GFRAA 36 (L) 03/15/2020 1154   GFRAA 37 (L) 09/11/2019 1009   GFRAA 25 (L) 01/29/2019 1039   GFRAA 37 (L) 12/09/2018 0316   GFRAA 30 (L) 11/27/2018 1243   GFRAA 31 (L) 11/06/2018 1337   CBC    Component Value Date/Time   WBC 10.8 (H) 11/03/2022 0351   RBC 3.33 (L) 11/03/2022 0351   HGB 10.4 (L) 11/03/2022 0351   HGB 10.0 (L) 10/22/2022 0832   HGB 11.6 05/10/2021 1104   HCT 32.2 (L) 11/03/2022 0351   HCT 36.3 05/10/2021 1104   PLT 304 11/03/2022 0351   PLT 257 10/22/2022 0832   PLT 300 05/10/2021 1104   MCV 96.7 11/03/2022 0351   MCV 93 05/10/2021 1104   MCH 31.2  11/03/2022 0351   MCHC 32.3 11/03/2022 0351   RDW 14.9 11/03/2022 0351   RDW 13.5 05/10/2021 1104   LYMPHSABS 2.1 11/02/2022 2206   MONOABS 0.9 11/02/2022 2206   EOSABS 0.0 11/02/2022 2206   BASOSABS 0.0 11/02/2022 2206   HEPATIC Function Panel Recent Labs    04/27/22 1349 10/22/22 0832 11/02/22 2206  PROT 8.2* 8.2* 7.8  ALBUMIN 3.8 4.1 3.7  AST 19 14* 19  ALT 17 9 17   ALKPHOS 123 135* 106   HEMOGLOBIN A1C Lab Results  Component Value Date   MPG 122.63 02/08/2021   CARDIAC ENZYMES Lab Results  Component Value Date   CKTOTAL 47 05/07/2011   CKMB 1.3 05/07/2011   TROPONINI <0.03 07/13/2018   TROPONINI <0.03 07/12/2018   TROPONINI <0.03 07/12/2018   BNP No results for input(s): "PROBNP" in the last 8760 hours. TSH No results for input(s): "TSH" in the last 8760 hours. CHOLESTEROL Recent Labs     11/03/22 0351  CHOL 132    Scheduled Meds:  amLODipine  10 mg Oral Daily   anastrozole  1 mg Oral Daily   aspirin EC  81 mg Oral Daily   atorvastatin  20 mg Oral Daily   escitalopram  10 mg Oral Daily   fluticasone furoate-vilanterol  1 puff Inhalation Daily   gabapentin  100 mg Oral TID   insulin glargine-yfgn  5 Units Subcutaneous Daily   metoprolol succinate  50 mg Oral Daily   multivitamin with minerals  1 tablet Oral Daily   pantoprazole  40 mg Oral Daily   Continuous Infusions:  sodium chloride 50 mL/hr at 11/04/22 0535   PRN Meds:.acetaminophen, albuterol, nitroGLYCERIN, ondansetron (ZOFRAN) IV, oxyCODONE  Assessment/Plan: Acute coronary syndrome HTN Type 2 DM S/P right breast cancer and surgery-2019 Aortic atherosclerosis  Plan: NM myocardial perfusion stress test tomorrow.   LOS: 0 days   Time spent including chart review, lab review, examination, discussion with patient :  min   Orpah Cobb  MD  11/04/2022, 12:06 PM

## 2022-11-04 NOTE — Progress Notes (Signed)
Mobility Specialist Progress Note    11/04/22 1508  Mobility  Activity Ambulated with assistance in hallway  Level of Assistance Contact guard assist, steadying assist  Assistive Device Cane  Distance Ambulated (ft) 80 ft  Activity Response Tolerated well  Mobility Referral Yes  $Mobility charge 1 Mobility  Mobility Specialist Start Time (ACUTE ONLY) 1451  Mobility Specialist Stop Time (ACUTE ONLY) 1507  Mobility Specialist Time Calculation (min) (ACUTE ONLY) 16 min   Pre-Mobility: 62 HR, 94% SpO2  Pt received in bed and agreeable. No complaints. Pt's breathing was audible with a high pitch during ambulation. Returned to chair with call bell in reach. RN notified.   [place] Mobility Specialist  Please Neurosurgeon or Rehab Office at [phone]

## 2022-11-04 NOTE — Progress Notes (Signed)
No nitroglycerin due to BP; KADAKIA MD

## 2022-11-04 NOTE — Care Management Obs Status (Signed)
MEDICARE OBSERVATION STATUS NOTIFICATION   Patient Details  Name: [name] MRN: [id number] Date of Birth: August 05, 1950   Medicare Observation Status Notification Given:  Yes    Ronny Bacon, RN 11/04/2022, 7:21 AM

## 2022-11-05 ENCOUNTER — Ambulatory Visit (HOSPITAL_COMMUNITY): Admission: RE | Admit: 2022-11-05 | Payer: 59 | Source: Ambulatory Visit

## 2022-11-05 ENCOUNTER — Inpatient Hospital Stay (HOSPITAL_COMMUNITY): Payer: 59

## 2022-11-05 LAB — CBC
HCT: 29.6 % — ABNORMAL LOW (ref 36.0–46.0)
Hemoglobin: 9.6 g/dL — ABNORMAL LOW (ref 12.0–15.0)
MCH: 31.1 pg (ref 26.0–34.0)
MCHC: 32.4 g/dL (ref 30.0–36.0)
MCV: 95.8 fL (ref 80.0–100.0)
Platelets: 234 10*3/uL (ref 150–400)
RBC: 3.09 MIL/uL — ABNORMAL LOW (ref 3.87–5.11)
RDW: 15.2 % (ref 11.5–15.5)
WBC: 8 10*3/uL (ref 4.0–10.5)
nRBC: 0.8 % — ABNORMAL HIGH (ref 0.0–0.2)

## 2022-11-05 LAB — BASIC METABOLIC PANEL
Anion gap: 9 (ref 5–15)
BUN: 33 mg/dL — ABNORMAL HIGH (ref 8–23)
CO2: 21 mmol/L — ABNORMAL LOW (ref 22–32)
Calcium: 8.7 mg/dL — ABNORMAL LOW (ref 8.9–10.3)
Chloride: 107 mmol/L (ref 98–111)
Creatinine, Ser: 2.07 mg/dL — ABNORMAL HIGH (ref 0.44–1.00)
GFR, Estimated: 25 mL/min — ABNORMAL LOW (ref >60)
Glucose, Bld: 76 mg/dL (ref 70–99)
Potassium: 4.6 mmol/L (ref 3.5–5.1)
Sodium: 137 mmol/L (ref 135–145)

## 2022-11-05 MED ORDER — TECHNETIUM TC 99M TETROFOSMIN IV KIT
30.0000 | PACK | Freq: Once | INTRAVENOUS | Status: AC | PRN
  Administered 2022-11-05: 30 via INTRAVENOUS

## 2022-11-05 MED ORDER — TECHNETIUM TC 99M TETROFOSMIN IV KIT
9.9700 | PACK | Freq: Once | INTRAVENOUS | Status: AC | PRN
  Administered 2022-11-05: 9.97 via INTRAVENOUS

## 2022-11-05 MED ORDER — REGADENOSON 0.4 MG/5ML IV SOLN
INTRAVENOUS | Status: AC
  Filled 2022-11-05: qty 5

## 2022-11-05 MED ORDER — NITROGLYCERIN 0.4 MG SL SUBL
SUBLINGUAL_TABLET | SUBLINGUAL | Status: AC
  Filled 2022-11-05: qty 1

## 2022-11-05 MED ORDER — REGADENOSON 0.4 MG/5ML IV SOLN
0.4000 mg | Freq: Once | INTRAVENOUS | Status: AC
  Administered 2022-11-05: 0.4 mg via INTRAVENOUS
  Filled 2022-11-05: qty 5

## 2022-11-05 NOTE — Progress Notes (Signed)
Subjective:  Complains of vague localizes this pain without associated symptoms.  Schedule for Lexiscan Myoview today  Objective:  Vital Signs in the last 24 hours: Temp:  [97.8 F (36.6 C)-98.1 F (36.7 C)] 97.9 F (36.6 C) (09/30 0730) Pulse Rate:  [72] 72 (09/29 1120) Resp:  [18-20] 18 (09/30 0730) BP: (95-108)/(69-78) 108/78 (09/29 2000) SpO2:  [95 %] 95 % (09/29 1120)  Intake/Output from previous day: No intake/output data recorded. Intake/Output from this shift: No intake/output data recorded.  Physical Exam: Neck: no adenopathy, no carotid bruit, no JVD, and supple, symmetrical, trachea midline Lungs: clear to auscultation bilaterally Heart: regular rate and rhythm and S1, S2 normal Abdomen: soft, non-tender; bowel sounds normal; no masses,  no organomegaly Extremities: extremities normal, atraumatic, no cyanosis or edema  Lab Results: Recent Labs    11/03/22 0351 11/05/22 0453  WBC 10.8* 8.0  HGB 10.4* 9.6*  PLT 304 234   Recent Labs    11/03/22 0351 11/05/22 0453  NA 135 137  K 4.6 4.6  CL 104 107  CO2 20* 21*  GLUCOSE 248* 76  BUN 45* 33*  CREATININE 2.62* 2.07*   No results for input(s): "TROPONINI" in the last 72 hours.  Invalid input(s): "CK", "MB" Hepatic Function Panel Recent Labs    11/02/22 2206  PROT 7.8  ALBUMIN 3.7  AST 19  ALT 17  ALKPHOS 106  BILITOT 0.6   Recent Labs    11/03/22 0351  CHOL 132   No results for input(s): "PROTIME" in the last 72 hours.  Imaging: Imaging results have been reviewed and ECHOCARDIOGRAM COMPLETE  Result Date: 11/04/2022    ECHOCARDIOGRAM REPORT   Patient Name:   [name] Date of Exam: 11/03/2022 Medical Rec #:  [id number]      Height:       59.0 in Accession #:    [id number]     Weight:       127.9 lb Date of Birth:  1950/03/21      BSA:          1.525 m Patient Age:    72 years       BP:           101/76 mmHg Patient Gender: F              HR:           68 bpm. Exam Location:  Inpatient  Procedure: 2D Echo, Cardiac Doppler and Color Doppler Indications:     Acute ischemic heart disease, unspecified I24.9  History:         Patient has prior history of Echocardiogram examinations, most                  recent 05/29/2019. CAD, Previous Myocardial Infarction and                  Angina, COPD, Signs/Symptoms:Chest Pain and Dyspnea; Risk                  Factors:Hypertension, Diabetes and Dyslipidemia. ESRD, Breast                  Cancer (R breast).  Sonographer:     Lucendia Herrlich Referring Phys:  [id number] Orpah Cobb Diagnosing Phys: Orpah Cobb MD IMPRESSIONS  1. Left ventricular ejection fraction, by estimation, is 65 to 70%. The left ventricle has normal function. The left ventricle has no regional wall motion abnormalities. Left ventricular diastolic parameters are consistent with  Grade I diastolic dysfunction (impaired relaxation).  2. Right ventricular systolic function is normal. The right ventricular size is normal.  3. Left atrial size was mildly dilated.  4. Right atrial size was mildly dilated.  5. The mitral valve is normal in structure. Trivial mitral valve regurgitation.  6. The aortic valve is tricuspid. There is mild calcification of the aortic valve. Aortic valve regurgitation is mild. Aortic valve sclerosis is present, with no evidence of aortic valve stenosis.  7. The inferior vena cava is normal in size with greater than 50% respiratory variability, suggesting right atrial pressure of 3 mmHg. FINDINGS  Left Ventricle: Left ventricular ejection fraction, by estimation, is 65 to 70%. The left ventricle has normal function. The left ventricle has no regional wall motion abnormalities. The left ventricular internal cavity size was normal in size. There is  no left ventricular hypertrophy. Left ventricular diastolic parameters are consistent with Grade I diastolic dysfunction (impaired relaxation). Right Ventricle: The right ventricular size is normal. No increase in right ventricular  wall thickness. Right ventricular systolic function is normal. Left Atrium: Left atrial size was mildly dilated. Right Atrium: Right atrial size was mildly dilated. Pericardium: There is no evidence of pericardial effusion. Mitral Valve: The mitral valve is normal in structure. Trivial mitral valve regurgitation. Tricuspid Valve: The tricuspid valve is normal in structure. Tricuspid valve regurgitation is trivial. Aortic Valve: The aortic valve is tricuspid. There is mild calcification of the aortic valve. There is mild aortic valve annular calcification. Aortic valve regurgitation is mild. Aortic regurgitation PHT measures 587 msec. Aortic valve sclerosis is present, with no evidence of aortic valve stenosis. Aortic valve peak gradient measures 8.5 mmHg. Pulmonic Valve: The pulmonic valve was normal in structure. Pulmonic valve regurgitation is not visualized. Aorta: The aortic root is normal in size and structure. Venous: The inferior vena cava is normal in size with greater than 50% respiratory variability, suggesting right atrial pressure of 3 mmHg. IAS/Shunts: The atrial septum is grossly normal.  LEFT VENTRICLE PLAX 2D LVIDd:         4.70 cm   Diastology LVIDs:         2.70 cm   LV e' medial:    7.29 cm/s LV PW:         0.90 cm   LV E/e' medial:  10.9 LV IVS:        0.80 cm   LV e' lateral:   6.96 cm/s LVOT diam:     2.00 cm   LV E/e' lateral: 11.5 LV SV:         83 LV SV Index:   55 LVOT Area:     3.14 cm  RIGHT VENTRICLE             IVC RV S prime:     16.20 cm/s  IVC diam: 1.50 cm TAPSE (M-mode): 2.3 cm LEFT ATRIUM             Index        RIGHT ATRIUM           Index LA diam:        3.80 cm 2.49 cm/m   RA Area:     14.10 cm LA Vol (A2C):   33.3 ml 21.84 ml/m  RA Volume:   32.50 ml  21.31 ml/m LA Vol (A4C):   44.3 ml 29.05 ml/m LA Biplane Vol: 41.3 ml 27.08 ml/m  AORTIC VALVE AV Area (Vmax): 2.70 cm AV Vmax:  146.00 cm/s AV Peak Grad:   8.5 mmHg LVOT Vmax:      125.67 cm/s LVOT Vmean:      80.433 cm/s LVOT VTI:       0.265 m AI PHT:         587 msec  AORTA Ao Root diam: 2.80 cm Ao Asc diam:  3.40 cm MITRAL VALVE                TRICUSPID VALVE MV Area (PHT): 3.48 cm     TR Peak grad:   7.0 mmHg MV Decel Time: 218 msec     TR Vmax:        132.00 cm/s MV E velocity: 79.80 cm/s MV A velocity: 112.00 cm/s  SHUNTS MV E/A ratio:  0.71         Systemic VTI:  0.26 m                             Systemic Diam: 2.00 cm Orpah Cobb MD Electronically signed by Orpah Cobb MD Signature Date/Time: 11/04/2022/11:45:22 AM    Final     Cardiac Studies:  Assessment/Plan:  Atypical chest pain MI ruled out Coronary artery disease status post PCI to RCA and left circumflex and remote past Hypertension Hyperlipidemia Diabetes mellitus Chronic kidney disease stage IV Anemia of chronic disease History of carcinoma of the right breast status post right mastectomy in the past Degenerative joint disease Plan Continue present management Schedule for Lexiscan Myoview if no evidence of ischemia will DC home later today.    LOS: 1 day    Rinaldo Cloud 11/05/2022, 9:29 AM

## 2022-11-05 NOTE — Discharge Summary (Signed)
NAME: [name], [name] MEDICAL RECORD NO: [id number] ACCOUNT NO: [id number] DATE OF BIRTH: 1950/04/20 FACILITY: [facility] LOCATION: [facility] PHYSICIAN: Eduardo Osier. Sharyn Lull, MD  Discharge Summary   DATE OF DISCHARGE: 11/05/2022  ADMITTING PHYSICIAN:  Dr. Algie Coffer.  DATE OF ADMISSION:  11/02/2022.  DATE OF DISCHARGE:  11/05/2022.  ADMITTING DIAGNOSES: 1.  Acute coronary syndrome. 2.  Hypertension. 3.  Diabetes mellitus. 4.  Status post breast cancer, status post surgery in 2019. 5.  Aortic atherosclerosis.  FINAL DIAGNOSES: 1.  Atypical chest pain, MI ruled out, negative nuclear stress test. 2.  Coronary artery disease, status post PCI to RCA and left circumflex in remote past. 3.  Hypertension. 4.  Hyperlipidemia. 5.  Diabetes mellitus. 6.  Chronic kidney disease stage IV. 7.  Anemia of chronic disease. 8.  History of carcinoma of the right breast, status post right mastectomy in the past. 9.  Degenerative joint disease. 10.  Gouty arthritis.  DISCHARGE HOME MEDICATIONS: 1.  Acetaminophen 650 mg every 8 hours as needed. 2.  Albuterol inhaler 1-2 puffs q. 6 hours. 3.  Albuterol nebulizer as before, every 6 hours as needed. 4.  Allopurinol 100 mg daily. 5.  Amlodipine 5 mg daily. 6.  Anastrozole 1 mg daily. 7.  Aspirin 81 mg daily. 8.  Atorvastatin 20 mg daily. 9.  BuSpar 10 mg twice daily. 10.  Cymbalta 30 mg daily. 11.  Ferrous sulfate 325 mg daily. 12.  Flonase 2 sprays both nostrils daily. 13.  Breo Ellipta 1 puff daily. 14.  Gabapentin 100 mg in the morning and afternoon and 200 mg at night. 15.  Lantus insulin 5 units daily, at bedtime. 16.  Losartan 50 mg daily. 17.  Metoprolol succinate 50 mg daily. 18.  Multivitamin 1 tablet daily. 19.  Nitrostat 0.4 mg sublingual p.r.n. 20.  Zofran 4 mg every 8 hours as needed. 21.  Protonix 40 mg daily. 22.  Symbicort 160/4.5 mcg 2 puffs twice daily. 23.  Trulicity 0.75 mg per 0.5 mL subcutaneous once a week. 24.  Diclofenac  sodium 1% gel as before. 25.  Lidocaine 5% gel as before.  DIET:  Low salt, low cholesterol, renal diet.  CONDITION:  Condition at discharge is stable.  FOLLOWUP:  Follow up with me in 1 week.  Follow up with PMD as scheduled.  HOSPITAL COURSE:  The patient is a 72 year old female with past medical history significant for CAD, COPD, diabetes mellitus, hypertension, hyperlipidemia, right breast cancer, had sharp, intermittent retrosternal chest pain radiating to the back.  EMS  gave aspirin and sublingual nitro.  EMS also found blood pressure difference between 2 arms. EKG in the ED showed normal sinus rhythm with possible LVH.  Troponin I levels were normal.  BNP was minimally elevated.  Chest x-ray was unremarkable.  A CT is  positive for aortic atherosclerosis with emphysematous changes.  No evidence of PE or dissection.  PHYSICAL EXAMINATION: VITAL SIGNS:  On examination, blood pressure was 141/52, pulse 75.  She was afebrile. NEUROLOGIC:  Alert, cooperative, appears stated age, no distress. HEENT:  Normocephalic, atraumatic.  Eyes brown.  Pink conjunctivae.  Corneas clear.  PERRLA.  Extraocular movement intact. NECK:  Supple, no JVD, no bruit.  Trachea midline. CHEST:  Right breast mastectomy scar noted.  Nontender on palpation. LUNGS:  Clear to auscultation bilaterally. HEART:  Regular rate and rhythm.  S1, S2 are normal.  There was II/VI systolic murmur.  No click, rub or gallop. ABDOMEN:  Soft.  Bowel sounds present, nontender, no  organomegaly. EXTREMITIES:  No clubbing, cyanosis or edema.  Skin was warm and dry. NEURO:  She was alert, awake, oriented x 3.  Normal strength normal coordination.  LABORATORY DATA:  Sodium was 139, potassium 3.5, glucose 131, BUN 35, creatinine 2.79.  Her BNP was slightly elevated at 124.  Two sets of high sensitivity troponin I were negative, 6 and 7.  Her cholesterol was 132, HDL 50, triglycerides 50, LDL was 72.   Her hemoglobin was 10, hematocrit  32.9, white count of 5.8.  IMAGING:  A CT of the chest showed extensive calcification.  Extensive calcific atherosclerotic changes in the thoracic and abdominal aorta.  No evidence of aortic aneurysm or dissection.  No evidence of active pulmonary disease, emphysematous changes  noted in the lungs.  No evidence of pulmonary embolism.  Chest x-ray showed cardiomegaly, no active disease.  The patient had a 2D echo done, which showed normal LV systolic function with no wall motion abnormalities.  Grade I diastolic dysfunction, EF  of 65-70%, left and right atrium are mildly dilated.  There was no evidence of pericardial effusion.  There was trivial mitral and tricuspid regurgitation.  The patient underwent a nuclear stress test today, which showed no reversible ischemia or  infarction.  Normal left ventricular wall motion, ejection fraction 60%.  Noninvasive risk stratification low.  BRIEF HOSPITAL COURSE: The patient was admitted to telemetry unit.  MI was ruled out by serial enzymes and EKG.  The patient continued to have vague, localized chest pain, which increases with movement.  The patient subsequently underwent a nuclear  stress test, which showed no evidence of ischemia or infarction.  The patient did not have any anginal chest pain during the hospital stay, neither she required any sublingual nitro.  The patient will be discharged home on above medications and will be  followed up by me in 1 week and with her PMD as scheduled.   MUK D: 11/05/2022 3:56:12 pm T: 11/05/2022 9:46:00 pm  JOB: [id number]/ [id number]

## 2022-11-05 NOTE — Progress Notes (Signed)
Mobility Specialist Progress Note:   11/05/22 1500  Mobility  Activity Ambulated with assistance in hallway  Level of Assistance Contact guard assist, steadying assist  Assistive Device Four point cane  Distance Ambulated (ft) 120 ft  Activity Response Tolerated well  Mobility Referral Yes  $Mobility charge 1 Mobility  Mobility Specialist Start Time (ACUTE ONLY) 1456  Mobility Specialist Stop Time (ACUTE ONLY) 1510  Mobility Specialist Time Calculation (min) (ACUTE ONLY) 14 min    Pre Mobility: 76 HR During Mobility: 78 HR Post Mobility:  66 HR  Pt received in chair, agreeable to mobility. C/o BLE weakness, otherwise asymptomatic. VSS throughout. Returned to room w/o fault. Pt left in chair with call bell and all needs met.  [place] Earlene Plater Mobility Specialist Please contact via Special educational needs teacher or Rehab office at [phone]

## 2022-11-05 NOTE — Discharge Summary (Signed)
Discharge summary dictated on 11/05/2022 dictation number is [id number]

## 2022-11-06 LAB — LIPOPROTEIN A (LPA): Lipoprotein (a): 187.5 nmol/L — ABNORMAL HIGH (ref <75.0)

## 2022-12-26 NOTE — Telephone Encounter (Signed)
Telephone call  

## 2023-01-17 ENCOUNTER — Ambulatory Visit
Admission: RE | Admit: 2023-01-17 | Discharge: 2023-01-17 | Disposition: A | Discharge status: Home / Self Care | Payer: 59 | Source: Ambulatory Visit | Attending: Family | Admitting: Family

## 2023-01-17 ENCOUNTER — Other Ambulatory Visit: Payer: Self-pay | Admitting: Family

## 2023-01-17 DIAGNOSIS — R059 Cough, unspecified: Secondary | ICD-10-CM

## 2023-01-28 ENCOUNTER — Other Ambulatory Visit: Payer: Self-pay | Admitting: Hematology and Oncology

## 2023-01-28 ENCOUNTER — Other Ambulatory Visit: Payer: Self-pay | Admitting: Student

## 2023-01-28 DIAGNOSIS — Z1231 Encounter for screening mammogram for malignant neoplasm of breast: Secondary | ICD-10-CM

## 2023-03-09 ENCOUNTER — Other Ambulatory Visit: Payer: Self-pay

## 2023-03-09 ENCOUNTER — Emergency Department (HOSPITAL_COMMUNITY): Payer: 59

## 2023-03-09 ENCOUNTER — Emergency Department (HOSPITAL_COMMUNITY)
Admission: EM | Admit: 2023-03-09 | Discharge: 2023-03-09 | Disposition: A | Discharge status: Home / Self Care | Payer: 59 | Attending: Emergency Medicine | Admitting: Emergency Medicine

## 2023-03-09 ENCOUNTER — Encounter (HOSPITAL_COMMUNITY): Payer: Self-pay | Admitting: *Deleted

## 2023-03-09 DIAGNOSIS — Z7982 Long term (current) use of aspirin: Secondary | ICD-10-CM | POA: Diagnosis not present

## 2023-03-09 DIAGNOSIS — Z794 Long term (current) use of insulin: Secondary | ICD-10-CM | POA: Diagnosis not present

## 2023-03-09 DIAGNOSIS — M542 Cervicalgia: Secondary | ICD-10-CM | POA: Insufficient documentation

## 2023-03-09 DIAGNOSIS — R079 Chest pain, unspecified: Secondary | ICD-10-CM | POA: Diagnosis not present

## 2023-03-09 DIAGNOSIS — M47812 Spondylosis without myelopathy or radiculopathy, cervical region: Secondary | ICD-10-CM

## 2023-03-09 LAB — CBC WITH DIFFERENTIAL/PLATELET
Abs Immature Granulocytes: 0.07 10*3/uL (ref 0.00–0.07)
Basophils Absolute: 0 10*3/uL (ref 0.0–0.1)
Basophils Relative: 0 %
Eosinophils Absolute: 0.3 10*3/uL (ref 0.0–0.5)
Eosinophils Relative: 3 %
HCT: 33.7 % — ABNORMAL LOW (ref 36.0–46.0)
Hemoglobin: 10.4 g/dL — ABNORMAL LOW (ref 12.0–15.0)
Immature Granulocytes: 1 %
Lymphocytes Relative: 20 %
Lymphs Abs: 1.8 10*3/uL (ref 0.7–4.0)
MCH: 28.4 pg (ref 26.0–34.0)
MCHC: 30.9 g/dL (ref 30.0–36.0)
MCV: 92.1 fL (ref 80.0–100.0)
Monocytes Absolute: 0.8 10*3/uL (ref 0.1–1.0)
Monocytes Relative: 9 %
Neutro Abs: 5.8 10*3/uL (ref 1.7–7.7)
Neutrophils Relative %: 67 %
Platelets: 252 10*3/uL (ref 150–400)
RBC: 3.66 MIL/uL — ABNORMAL LOW (ref 3.87–5.11)
RDW: 15.9 % — ABNORMAL HIGH (ref 11.5–15.5)
WBC: 8.8 10*3/uL (ref 4.0–10.5)
nRBC: 0 % (ref 0.0–0.2)

## 2023-03-09 LAB — COMPREHENSIVE METABOLIC PANEL
ALT: 19 U/L (ref 0–44)
AST: 16 U/L (ref 15–41)
Albumin: 3.5 g/dL (ref 3.5–5.0)
Alkaline Phosphatase: 130 U/L — ABNORMAL HIGH (ref 38–126)
Anion gap: 10 (ref 5–15)
BUN: 24 mg/dL — ABNORMAL HIGH (ref 8–23)
CO2: 19 mmol/L — ABNORMAL LOW (ref 22–32)
Calcium: 9.4 mg/dL (ref 8.9–10.3)
Chloride: 109 mmol/L (ref 98–111)
Creatinine, Ser: 2.03 mg/dL — ABNORMAL HIGH (ref 0.44–1.00)
GFR, Estimated: 25 mL/min — ABNORMAL LOW (ref >60)
Glucose, Bld: 90 mg/dL (ref 70–99)
Potassium: 4 mmol/L (ref 3.5–5.1)
Sodium: 138 mmol/L (ref 135–145)
Total Bilirubin: 1 mg/dL (ref 0.0–1.2)
Total Protein: 8.2 g/dL — ABNORMAL HIGH (ref 6.5–8.1)

## 2023-03-09 LAB — TROPONIN I (HIGH SENSITIVITY)
Troponin I (High Sensitivity): 5 ng/L (ref <18)
Troponin I (High Sensitivity): 5 ng/L (ref <18)

## 2023-03-09 MED ORDER — MORPHINE SULFATE (PF) 2 MG/ML IV SOLN: 2.0000 mg | Freq: Once | INTRAVENOUS | Status: DC

## 2023-03-09 MED ORDER — PREDNISONE 20 MG PO TABS: 40.0000 mg | ORAL_TABLET | Freq: Every day | ORAL | 0 refills | Status: DC

## 2023-03-09 MED ORDER — METHOCARBAMOL 500 MG PO TABS: 500.0000 mg | ORAL_TABLET | Freq: Two times a day (BID) | ORAL | 0 refills | Status: DC

## 2023-03-09 MED ORDER — HYDROCODONE-ACETAMINOPHEN 5-325 MG PO TABS
1.0000 | ORAL_TABLET | Freq: Once | ORAL | Status: AC
  Administered 2023-03-09: 1 via ORAL
  Filled 2023-03-09: qty 1

## 2023-03-09 MED ORDER — MORPHINE SULFATE (PF) 2 MG/ML IV SOLN
2.0000 mg | Freq: Once | INTRAVENOUS | Status: AC
  Administered 2023-03-09: 2 mg via INTRAVENOUS
  Filled 2023-03-09: qty 1

## 2023-03-09 NOTE — ED Triage Notes (Signed)
Pt c/o "musular pain" that radiates from neck, down back.  Pain increases with movement and palpation.  VS stable.

## 2023-03-09 NOTE — ED Provider Notes (Signed)
Accepted handoff at shift change from [facility]. Please see prior provider note for more detail.   Briefly: Patient is 73 y.o.   DDX: concern for Neck pain, chest pain, last Sunday in church, right neck, central chest. Getting chest pain and neck pain work up. Non con for neck, since creatinine can't support angio. Early pneumonia on chest xray. Treat neck pain / muscle pain. Treat for early pneumonia. Close follow up.   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  I do think that patient's pain is consistent with facet arthritis, her troponins were unremarkable, her story is not typical for ACS related chest pain.  Will add Robaxin, and steroid burst to her home pain medicine regimen and encourage close neurosurgical follow-up.   [place] 03/09/23 Mitzie Na, MD 03/11/23 0002

## 2023-03-09 NOTE — Discharge Instructions (Addendum)
Please use Tylenol for pain.  You may use 1000 mg of Tylenol every 6 hours.  Not to exceed 4 g of Tylenol within 24 hours.  You can use the muscle relaxant I am prescribing in addition to the above to help with any breakthrough pain.  You can take it up to twice daily.  It is safe to take at night, but I would be cautious taking it during the day as it can cause some drowsiness.  Make sure that you are feeling awake and alert before you get behind the wheel of a car or operate a motor vehicle.  It is not a narcotic pain medication so you are able to take it if it is not making you drowsy and still pilot a vehicle or machinery safely.  Please take the entire course of steroids that were prescribed, follow-up with the neurosurgeon's contact remission provided above.

## 2023-03-09 NOTE — ED Provider Notes (Cosign Needed)
[facility] EM[facility] DE[facility]MENT AT [facility] Provider Note   CSN: [id number] Arrival date & time: 03/09/23  1107     History  No chief complaint on file.   Dominique [name] is a 73 y.o. female.  [name] complains of pain in her neck and the front of her chest.  [name] reports that the pain began Sunday after church.  [name] reports pain has progressively gotten worse.  [name] denies any fever or chills she has not had any cough or congestion.  [name] does not denies any shortness of breath she reports that she has not been around anyone who had flu or COVID.  [name] complains of pain when she moves her chest or head.  The history is provided by the [name]. No language interpreter was used.       Home Medications Prior to Admission medications   Medication Sig Start Date End Date Taking? Authorizing Provider  acetaminophen (TYLENOL) 650 MG CR tablet Take 650 mg by mouth every 8 (eight) hours as needed for pain.    [provider]  albuterol (PROVENTIL) (2.5 MG/3ML) 0.083% nebulizer solution Take 3 mLs (2.5 mg total) by nebulization every 6 (six) hours as needed for wheezing or shortness of breath. 08/02/21   Margaretann Loveless, PA-C  albuterol (VENTOLIN HFA) 108 (90 Base) MCG/ACT inhaler Inhale 1-2 puffs into the lungs every 6 (six) hours as needed for wheezing or shortness of breath. 09/14/21   Anders Simmonds, PA-C  allopurinol (ZYLOPRIM) 100 MG tablet Take 100 mg by mouth daily.    [provider]  amLODipine (NORVASC) 5 MG tablet Take 5 mg by mouth daily.    [provider]  anastrozole (ARIMIDEX) 1 MG tablet TAKE ONE TABLET BY MOUTH ONCE DAILY 06/18/22   Serena Croissant, MD  aspirin 81 MG EC tablet Take 1 tablet (81 mg total) by mouth daily. 10/13/18   Pokhrel, Rebekah Chesterfield, MD  atorvastatin (LIPITOR) 20 MG tablet Take 20 mg by mouth daily.    [provider]  benzonatate (TESSALON) 100 MG capsule Take 1 capsule (100 mg total) by mouth 3  (three) times daily as needed. 08/02/21   Margaretann Loveless, PA-C  Blood Pressure Monitor DEVI Please provide [name] with insurance approved blood pressure monitor. I10.0 11/15/20   Claiborne Rigg, NP  busPIRone (BUSPAR) 10 MG tablet Take 10 mg by mouth 2 (two) times daily.    [provider]  diclofenac Sodium (VOLTAREN) 1 % GEL Apply 1 Application topically 4 (four) times daily. [name] taking differently: Apply 1 Application topically in the morning and at bedtime. 09/13/21   Serena Croissant, MD  Dulaglutide (TRULICITY) 0.75 MG/0.5ML SOPN Inject 0.75 mg into the skin once a week.    [provider]  DULoxetine (CYMBALTA) 30 MG capsule Take 30 mg by mouth daily.    [provider]  ferrous sulfate 324 MG TBEC Take 324 mg by mouth daily.    [provider]  fluticasone (FLONASE) 50 MCG/ACT nasal spray Place 2 sprays into both nostrils daily. 05/10/21   Hoy Register, MD  fluticasone furoate-vilanterol (BREO ELLIPTA) 200-25 MCG/ACT AEPB Inhale 1 puff into the lungs daily. Must have office visit for refills 03/28/22   Hoy Register, MD  gabapentin (NEURONTIN) 100 MG capsule Take 100-200 mg by mouth See admin instructions. Take one tablet by mouth in the morning and afternoon, then take 2 tablets every night per [name] 03/12/22   [provider]  Insulin Pen Needle (  EASY TOUCH PEN NEEDLES) 31G X 8 MM MISC Use to inject insulin once daily and Victoza once daily. Max of 2 pen needles a day. Must have office visit for refills 03/28/22   Hoy Register, MD  LANTUS SOLOSTAR 100 UNIT/ML Solostar Pen Inject 5 Units into the skin at bedtime. Must have office visit for refills 03/28/22   Hoy Register, MD  lidocaine (LIDODERM) 5 % Place 1 patch onto the skin daily. Remove & Discard patch within 12 hours or as directed by MD [name] taking differently: Place 1 patch onto the skin daily as needed (for pain). 04/12/21   Ernie Avena, MD  losartan (COZAAR) 50 MG tablet  TAKE ONE TABLET BY MOUTH DAILY. STOP 25mg  DOSE 08/22/21   Claiborne Rigg, NP  metoprolol succinate (TOPROL-XL) 50 MG 24 hr tablet Take 1 tablet (50 mg total) by mouth daily. Must have office visit for refills 03/28/22   Hoy Register, MD  Misc. Devices MISC Please provide [name] with nebulizer mask and tubing. [id number] 12/14/19   Claiborne Rigg, NP  Multiple Vitamin (MULTIVITAMIN WITH MINERALS) TABS tablet Take 1 tablet by mouth daily. Sentry    [provider]  nitroGLYCERIN (NITROSTAT) 0.4 MG SL tablet Place 1 tablet (0.4 mg total) under the tongue every 5 (five) minutes as needed for chest pain. 03/15/20   Claiborne Rigg, NP  ondansetron (ZOFRAN-ODT) 4 MG disintegrating tablet Take 4 mg by mouth every 8 (eight) hours as needed for nausea or vomiting.    [provider]  pantoprazole (PROTONIX) 40 MG tablet TAKE ONE TABLET BY MOUTH EVERY DAY FOR ACID REFLUX 11/02/21   Claiborne Rigg, NP  Respiratory Therapy Supplies (NEBULIZER MASK ADULT) MISC 1 Units by Does not apply route daily as needed. ICD 10 J44.9 05/08/21   Claiborne Rigg, NP  SYMBICORT 160-4.5 MCG/ACT inhaler Inhale 2 puffs into the lungs in the morning and at bedtime. 02/28/22   [provider]      Allergies    [name] has no known allergies.    Review of Systems   Review of Systems  Constitutional:  Negative for chills and fever.  HENT:  Positive for ear pain. Negative for sore throat.   Cardiovascular:  Negative for chest pain.  Gastrointestinal:  Negative for abdominal pain and vomiting.  Genitourinary:  Negative for dysuria.  Musculoskeletal:  Negative for arthralgias and back pain.  Skin:  Negative for color change and rash.  All other systems reviewed and are negative.   Physical Exam Updated Vital Signs BP 113/74   Pulse 75   Temp 99.7 F (37.6 C) (Oral)   Ht 4\' 11"  (1.499 m)   Wt 58.1 kg   LMP  (LMP Unknown) Comment: tubal ligation  SpO2 98%   BMI 25.85 kg/m  Physical  Exam Vitals reviewed.  HENT:     Head: Normocephalic.     Nose: Nose normal.  Neck:     Comments: Tender neck trapezius area and anterior chest to palpation. Cardiovascular:     Rate and Rhythm: Normal rate and regular rhythm.  Pulmonary:     Effort: Pulmonary effort is normal.  Abdominal:     General: Abdomen is flat.  Musculoskeletal:        General: Normal range of motion.     Cervical back: Tenderness present.  Skin:    General: Skin is warm.  Neurological:     General: No focal deficit present.     Mental Status: She  is alert.     ED Results / Procedures / Treatments   Labs (all labs ordered are listed, but only abnormal results are displayed) Labs Reviewed  CBC WITH DIFFERENTIAL/PLATELET - Abnormal; Notable for the following components:      Result Value   RBC 3.66 (*)    Hemoglobin 10.4 (*)    HCT 33.7 (*)    RDW 15.9 (*)    All other components within normal limits  COMPREHENSIVE METABOLIC PANEL  TROPONIN I (HIGH SENSITIVITY)  TROPONIN I (HIGH SENSITIVITY)    EKG None  Radiology DG Chest Port 1 View Result Date: 03/09/2023 CLINICAL DATA:  Chest pain EXAM: PORTABLE CHEST 1 VIEW COMPARISON:  01/17/2023 FINDINGS: Surgical clips project over the lower right lateral chest. The Chin overlies the right apex. Reverse apical lordotic positioning. Left shoulder arthroplasty. Midline trachea. Mild cardiomegaly. Atherosclerosis in the transverse aorta. Small left pleural effusion. No pneumothorax. Low lung volumes with resultant pulmonary interstitial prominence. Left hemidiaphragm elevation with increased volume loss and airspace disease at the lung base. IMPRESSION: Small left pleural effusion with left hemidiaphragm elevation and adjacent airspace disease. Favor atelectasis or scar. Early or mild pneumonia cannot be entirely excluded. Aortic Atherosclerosis (ICD10-I70.0). Electronically Signed   By: Jeronimo Greaves M.D.   On: 03/09/2023 12:40    Procedures Procedures     Medications Ordered in ED Medications  HYDROcodone-acetaminophen (NORCO/VICODIN) 5-325 MG per tablet 1 tablet (1 tablet Oral Given 03/09/23 1246)    ED Course/ Medical Decision Making/ A&P Clinical Course as of 03/09/23 1542  Sat Mar 09, 2023  1536 Neck pain, chest pain, last Sunday in church, right neck, central chest. Getting chest pain and neck pain work up. Non con for neck, since creatinine can't support angio. Early pneumonia on chest xray. Treat neck pain / muscle pain. Treat for early pneumonia. Close follow up.  [CP]    Clinical Course User Index [CP] Olene Floss, PA-C                                 Medical Decision Making [name] complains of pain in the muscles of her neck and her upper chest.  Amount and/or Complexity of Data Reviewed Labs: ordered. Decision-making details documented in ED Course.    Details: Labs ordered reviewed and interpreted Radiology: ordered and independent interpretation performed. Decision-making details documented in ED Course.    Details: Chest x-ray shows small left pleural effusion possible early pneumonia ECG/medicine tests: ordered and independent interpretation performed. Decision-making details documented in ED Course.    Details: EKG sinus bradycardia no acute changes  Risk Prescription drug management. Risk Details: Pt's care turned over to [facility]           Final Clinical Impression(s) / ED Diagnoses Final diagnoses:  Neck pain  Chest pain, unspecified type    Rx / DC Orders ED Discharge Orders     None         Elson Areas, [place] 03/09/23 1544

## 2023-03-11 ENCOUNTER — Ambulatory Visit: Payer: 59

## 2023-03-21 ENCOUNTER — Ambulatory Visit
Admission: RE | Admit: 2023-03-21 | Discharge: 2023-03-21 | Disposition: A | Discharge status: Home / Self Care | Payer: 59 | Source: Ambulatory Visit | Attending: Student | Admitting: Student

## 2023-03-21 DIAGNOSIS — Z1231 Encounter for screening mammogram for malignant neoplasm of breast: Secondary | ICD-10-CM

## 2023-04-19 ENCOUNTER — Other Ambulatory Visit: Payer: Self-pay | Admitting: *Deleted

## 2023-04-19 DIAGNOSIS — D472 Monoclonal gammopathy: Secondary | ICD-10-CM

## 2023-04-22 ENCOUNTER — Inpatient Hospital Stay: Payer: 59

## 2023-04-22 ENCOUNTER — Inpatient Hospital Stay: Attending: Hematology and Oncology

## 2023-04-22 ENCOUNTER — Inpatient Hospital Stay: Payer: 59 | Admitting: Hematology and Oncology

## 2023-04-22 ENCOUNTER — Telehealth: Payer: Self-pay

## 2023-04-22 DIAGNOSIS — D649 Anemia, unspecified: Secondary | ICD-10-CM | POA: Insufficient documentation

## 2023-04-22 DIAGNOSIS — Z1731 Human epidermal growth factor receptor 2 positive status: Secondary | ICD-10-CM | POA: Diagnosis not present

## 2023-04-22 DIAGNOSIS — Z79811 Long term (current) use of aromatase inhibitors: Secondary | ICD-10-CM | POA: Diagnosis not present

## 2023-04-22 DIAGNOSIS — D472 Monoclonal gammopathy: Secondary | ICD-10-CM | POA: Insufficient documentation

## 2023-04-22 DIAGNOSIS — C50411 Malignant neoplasm of upper-outer quadrant of right female breast: Secondary | ICD-10-CM | POA: Diagnosis present

## 2023-04-22 DIAGNOSIS — Z170 Estrogen receptor positive status [ER+]: Secondary | ICD-10-CM | POA: Diagnosis not present

## 2023-04-22 LAB — CBC WITH DIFFERENTIAL (CANCER CENTER ONLY)
Abs Immature Granulocytes: 0.14 10*3/uL — ABNORMAL HIGH (ref 0.00–0.07)
Basophils Absolute: 0.1 10*3/uL (ref 0.0–0.1)
Basophils Relative: 1 %
Eosinophils Absolute: 0.3 10*3/uL (ref 0.0–0.5)
Eosinophils Relative: 4 %
HCT: 34 % — ABNORMAL LOW (ref 36.0–46.0)
Hemoglobin: 10.7 g/dL — ABNORMAL LOW (ref 12.0–15.0)
Immature Granulocytes: 2 %
Lymphocytes Relative: 39 %
Lymphs Abs: 2.7 10*3/uL (ref 0.7–4.0)
MCH: 28.5 pg (ref 26.0–34.0)
MCHC: 31.5 g/dL (ref 30.0–36.0)
MCV: 90.4 fL (ref 80.0–100.0)
Monocytes Absolute: 0.7 10*3/uL (ref 0.1–1.0)
Monocytes Relative: 10 %
Neutro Abs: 3.1 10*3/uL (ref 1.7–7.7)
Neutrophils Relative %: 44 %
Platelet Count: 302 10*3/uL (ref 150–400)
RBC: 3.76 MIL/uL — ABNORMAL LOW (ref 3.87–5.11)
RDW: 15.1 % (ref 11.5–15.5)
WBC Count: 6.9 10*3/uL (ref 4.0–10.5)
nRBC: 0.3 % — ABNORMAL HIGH (ref 0.0–0.2)

## 2023-04-22 LAB — CMP (CANCER CENTER ONLY)
ALT: 7 U/L (ref 0–44)
AST: 11 U/L — ABNORMAL LOW (ref 15–41)
Albumin: 4.2 g/dL (ref 3.5–5.0)
Alkaline Phosphatase: 133 U/L — ABNORMAL HIGH (ref 38–126)
Anion gap: 6 (ref 5–15)
BUN: 22 mg/dL (ref 8–23)
CO2: 29 mmol/L (ref 22–32)
Calcium: 9.2 mg/dL (ref 8.9–10.3)
Chloride: 105 mmol/L (ref 98–111)
Creatinine: 2 mg/dL — ABNORMAL HIGH (ref 0.44–1.00)
GFR, Estimated: 26 mL/min — ABNORMAL LOW (ref >60)
Glucose, Bld: 138 mg/dL — ABNORMAL HIGH (ref 70–99)
Potassium: 4.1 mmol/L (ref 3.5–5.1)
Sodium: 140 mmol/L (ref 135–145)
Total Bilirubin: 0.6 mg/dL (ref 0.0–1.2)
Total Protein: 8.1 g/dL (ref 6.5–8.1)

## 2023-04-22 NOTE — Telephone Encounter (Signed)
 Attempted to call pt and LVM for her to call back regarding her appt today. Per MD, we can not proceed with her appt today, as she needs labs 2 days prior to her appt with him. Appt cancelled and message sent to scheduling.

## 2023-04-22 NOTE — Assessment & Plan Note (Deleted)
 History of breast cancer: Currently on anastrozole therapy and doing well.  Breast cancer surveillance:  Mammogram 03/26/2023: Benign breast density category B Breast exam 04/22/2023: Benign

## 2023-04-24 LAB — MULTIPLE MYELOMA PANEL, SERUM
Albumin SerPl Elph-Mcnc: 3.7 g/dL (ref 2.9–4.4)
Albumin/Glob SerPl: 1 (ref 0.7–1.7)
Alpha 1: 0.2 g/dL (ref 0.0–0.4)
Alpha2 Glob SerPl Elph-Mcnc: 0.9 g/dL (ref 0.4–1.0)
B-Globulin SerPl Elph-Mcnc: 1 g/dL (ref 0.7–1.3)
Gamma Glob SerPl Elph-Mcnc: 1.8 g/dL (ref 0.4–1.8)
Globulin, Total: 3.9 g/dL (ref 2.2–3.9)
IgA: 73 mg/dL (ref 64–422)
IgG (Immunoglobin G), Serum: 1947 mg/dL — ABNORMAL HIGH (ref 586–1602)
IgM (Immunoglobulin M), Srm: 26 mg/dL (ref 26–217)
M Protein SerPl Elph-Mcnc: 1.5 g/dL — ABNORMAL HIGH
Total Protein ELP: 7.6 g/dL (ref 6.0–8.5)

## 2023-04-24 LAB — BETA 2 MICROGLOBULIN, SERUM: Beta-2 Microglobulin: 4.2 mg/L — ABNORMAL HIGH (ref 0.6–2.4)

## 2023-04-24 LAB — KAPPA/LAMBDA LIGHT CHAINS
Kappa free light chain: 235.7 mg/L — ABNORMAL HIGH (ref 3.3–19.4)
Kappa, lambda light chain ratio: 13.78 — ABNORMAL HIGH (ref 0.26–1.65)
Lambda free light chains: 17.1 mg/L (ref 5.7–26.3)

## 2023-05-01 ENCOUNTER — Inpatient Hospital Stay (HOSPITAL_BASED_OUTPATIENT_CLINIC_OR_DEPARTMENT_OTHER): Admitting: Hematology and Oncology

## 2023-05-01 VITALS — BP 99/64 | HR 62 | Temp 98.2°F | Resp 15 | Wt 138.3 lb

## 2023-05-01 DIAGNOSIS — Z170 Estrogen receptor positive status [ER+]: Secondary | ICD-10-CM | POA: Diagnosis not present

## 2023-05-01 DIAGNOSIS — D472 Monoclonal gammopathy: Secondary | ICD-10-CM

## 2023-05-01 DIAGNOSIS — C50411 Malignant neoplasm of upper-outer quadrant of right female breast: Secondary | ICD-10-CM

## 2023-05-01 NOTE — Progress Notes (Signed)
 Patient Care Team: System, Provider Not In as PCP - General Serena Croissant, MD as Consulting Physician (Hematology and Oncology)  DIAGNOSIS:  Encounter Diagnoses  Name Primary?   Malignant neoplasm of upper-outer quadrant of right breast in female, estrogen receptor positive (HCC) Yes   MGUS (monoclonal gammopathy of unknown significance)     SUMMARY OF ONCOLOGIC HISTORY: Oncology History  Malignant neoplasm of upper-outer quadrant of right breast in female, estrogen receptor positive (HCC)  09/03/2017 Initial Diagnosis   Screening mammogram detected 1.4 cm right breast mass at 10 o'clock position, additional pleomorphic calcifications 2 cm posteriorly, additional loosely grouped calcifications 10.1 x 7.1 x 5.3 cm, single right axillary lymph node: Biopsy revealed DCIS ER 100%, PR 50%; IDC grade 2-3 ER 100%, PR 50%, Ki-67 15%, HER-2 positive ratio 2, copy #5, lymph node negative T2 N0 stage Ia clinical stage AJCC 8   09/03/2017 Cancer Staging   Staging form: Breast, AJCC 8th Edition - Clinical stage from 09/03/2017: Stage IB (cT2, cN0, cM0, G3, ER+, PR+, HER2+)   11/27/2017 Surgery   Right mastectomy ([facility]) ([id number]): IDC grade 2, 1.6 cm, separate foci of DCIS intermediate grade, margins negative, 1/5 LN positive for micrometasatic carcinoma. ER 100%, PR 50%, Ki-67 15%, HER-2 positive ratio 2, copy #5 T1c N0 stage Ia   11/27/2017 Cancer Staging   Staging form: Breast, AJCC 8th Edition - Pathologic stage from 11/27/2017: Stage IA (pT1c, pN0, cM0, G2, ER+, PR+, HER2+)     02/07/2018 - 03/12/2019 Chemotherapy   trastuzumab (HERCEPTIN) 483 mg in sodium chloride 0.9 % 250 mL chemo infusion, 8 mg/kg = 483 mg, Intravenous,  Once, 11 of 11 cycles. Administration: 483 mg (02/07/2018), 357 mg (02/27/2018), 357 mg (05/01/2018), 357 mg (05/23/2018), 357 mg (06/12/2018), 357 mg (07/03/2018), 357 mg (08/14/2018), 357 mg (09/04/2018), 357 mg (10/17/2018), 357 mg (03/20/2018), 357 mg  (04/10/2018)  trastuzumab-anns (KANJINTI) 357 mg in sodium chloride 0.9 % 250 mL chemo infusion, 6 mg/kg = 357 mg (100 % of original dose 6 mg/kg), Intravenous,  Once, 7 of 7 cycles. Dose modification: 6 mg/kg (original dose 6 mg/kg, Cycle 12, Reason: Other (see comments)). Administration: 357 mg (11/06/2018), 357 mg (11/27/2018), 357 mg (12/18/2018), 357 mg (01/08/2019), 357 mg (01/29/2019), 357 mg (02/19/2019), 357 mg (03/12/2019).   12/2018 - 12/2023 Anti-estrogen oral therapy   Anastrozole     CHIEF COMPLIANT: Follow-up of MGUS and breast cancer  HISTORY OF PRESENT ILLNESS:  History of Present Illness The patient, with a history of breast cancer and MGUS, presents with persistent right breast pain. The pain is localized to the area of previous breast surgery and is associated with scar tissue. The patient denies feeling any new lumps or bumps in the area.  The patient's MGUS has remained stable, with a slight improvement in the M protein level from 1.7 to 1.5. The patient is slightly anemic, but this has improved compared to previous years. The immunoglobulin or IgG level has also improved. Kidney function remains stable with a creatinine level of 2.0, consistent with the past two years.  The patient had a mammogram of the left breast which was unremarkable. The patient is currently on gabapentin for pain management, but continues to experience significant discomfort. The patient is also on anastrozole for breast cancer, which is due to be completed by November of this year.     ALLERGIES:  has no known allergies.  MEDICATIONS:  Current Outpatient Medications  Medication Sig Dispense Refill   acetaminophen (TYLENOL) 650 MG CR  tablet Take 650 mg by mouth every 8 (eight) hours as needed for pain.     albuterol (PROVENTIL) (2.5 MG/3ML) 0.083% nebulizer solution Take 3 mLs (2.5 mg total) by nebulization every 6 (six) hours as needed for wheezing or shortness of breath. 75 mL 3   albuterol  (VENTOLIN HFA) 108 (90 Base) MCG/ACT inhaler Inhale 1-2 puffs into the lungs every 6 (six) hours as needed for wheezing or shortness of breath. 18 g 2   allopurinol (ZYLOPRIM) 100 MG tablet Take 100 mg by mouth daily.     amLODipine (NORVASC) 5 MG tablet Take 5 mg by mouth daily.     anastrozole (ARIMIDEX) 1 MG tablet TAKE ONE TABLET BY MOUTH ONCE DAILY 90 tablet 0   aspirin 81 MG EC tablet Take 1 tablet (81 mg total) by mouth daily. 30 tablet 0   atorvastatin (LIPITOR) 20 MG tablet Take 20 mg by mouth daily.     benzonatate (TESSALON) 100 MG capsule Take 1 capsule (100 mg total) by mouth 3 (three) times daily as needed. 30 capsule 0   Blood Pressure Monitor DEVI Please provide patient with insurance approved blood pressure monitor. I10.0 1 each 0   busPIRone (BUSPAR) 10 MG tablet Take 10 mg by mouth 2 (two) times daily.     diclofenac Sodium (VOLTAREN) 1 % GEL Apply 1 Application topically 4 (four) times daily. (Patient taking differently: Apply 1 Application topically in the morning and at bedtime.) 100 g 3   Dulaglutide (TRULICITY) 0.75 MG/0.5ML SOPN Inject 0.75 mg into the skin once a week.     DULoxetine (CYMBALTA) 30 MG capsule Take 30 mg by mouth daily.     ferrous sulfate 324 MG TBEC Take 324 mg by mouth daily.     fluticasone (FLONASE) 50 MCG/ACT nasal spray Place 2 sprays into both nostrils daily. 16 g 6   fluticasone furoate-vilanterol (BREO ELLIPTA) 200-25 MCG/ACT AEPB Inhale 1 puff into the lungs daily. Must have office visit for refills 60 each 0   gabapentin (NEURONTIN) 100 MG capsule Take 100-200 mg by mouth See admin instructions. Take one tablet by mouth in the morning and afternoon, then take 2 tablets every night per patient     Insulin Pen Needle (EASY TOUCH PEN NEEDLES) 31G X 8 MM MISC Use to inject insulin once daily and Victoza once daily. Max of 2 pen needles a day. Must have office visit for refills 100 each 0   LANTUS SOLOSTAR 100 UNIT/ML Solostar Pen Inject 5 Units into  the skin at bedtime. Must have office visit for refills 15 mL 0   lidocaine (LIDODERM) 5 % Place 1 patch onto the skin daily. Remove & Discard patch within 12 hours or as directed by MD (Patient taking differently: Place 1 patch onto the skin daily as needed (for pain).) 30 patch 0   losartan (COZAAR) 50 MG tablet TAKE ONE TABLET BY MOUTH DAILY. STOP 25mg  DOSE 90 tablet 0   methocarbamol (ROBAXIN) 500 MG tablet Take 1 tablet (500 mg total) by mouth 2 (two) times daily. 20 tablet 0   metoprolol succinate (TOPROL-XL) 50 MG 24 hr tablet Take 1 tablet (50 mg total) by mouth daily. Must have office visit for refills 30 tablet 0   Misc. Devices MISC Please provide patient with nebulizer mask and tubing. ICD-10J44.9 1 each 0   Multiple Vitamin (MULTIVITAMIN WITH MINERALS) TABS tablet Take 1 tablet by mouth daily. [employer]entry     nitroGLYCERIN (NITROSTAT) 0.4 MG SL tablet  Place 1 tablet (0.4 mg total) under the tongue every 5 (five) minutes as needed for chest pain. 10 tablet 0   ondansetron (ZOFRAN-ODT) 4 MG disintegrating tablet Take 4 mg by mouth every 8 (eight) hours as needed for nausea or vomiting.     pantoprazole (PROTONIX) 40 MG tablet TAKE ONE TABLET BY MOUTH EVERY DAY FOR ACID REFLUX 90 tablet 0   predniSONE (DELTASONE) 20 MG tablet Take 2 tablets (40 mg total) by mouth daily. 10 tablet 0   Respiratory Therapy Supplies (NEBULIZER MASK ADULT) MISC 1 Units by Does not apply route daily as needed. ICD 10 J44.9 1 each 0   SYMBICORT 160-4.5 MCG/ACT inhaler Inhale 2 puffs into the lungs in the morning and at bedtime.     No current facility-administered medications for this visit.    PHYSICAL EXAMINATION: ECOG PERFORMANCE STATUS: 1 - Symptomatic but completely ambulatory  Vitals:   05/01/23 1149  BP: 99/64  Pulse: 62  Resp: 15  Temp: 98.2 F (36.8 C)  SpO2: 98%   Filed Weights   05/01/23 1149  Weight: 138 lb 4.8 oz (62.7 kg)    Physical Exam BREAST: Breasts without palpable masses or  abnormalities.  Right chest wall is tender to palpation with scar tissue there is crisscrossing the chest wall causing pockets of skin and subcutaneous tissue to form into islands and causing pain.  (exam performed in the presence of a chaperone)  LABORATORY DATA:  I have reviewed the data as listed    Latest Ref Rng & Units 04/22/2023    1:36 PM 03/09/2023    1:29 PM 11/05/2022    4:53 AM  CMP  Glucose 70 - 99 mg/dL 161  90  76   BUN 8 - 23 mg/dL 22  24  33   Creatinine 0.44 - 1.00 mg/dL 0.96  0.45  4.09   Sodium 135 - 145 mmol/L 140  138  137   Potassium 3.5 - 5.1 mmol/L 4.1  4.0  4.6   Chloride 98 - 111 mmol/L 105  109  107   CO2 22 - 32 mmol/L 29  19  21    Calcium 8.9 - 10.3 mg/dL 9.2  9.4  8.7   Total Protein 6.5 - 8.1 g/dL 8.1  8.2    Total Bilirubin 0.0 - 1.2 mg/dL 0.6  1.0    Alkaline Phos 38 - 126 U/L 133  130    AST 15 - 41 U/L 11  16    ALT 0 - 44 U/L 7  19      Lab Results  Component Value Date   WBC 6.9 04/22/2023   HGB 10.7 (L) 04/22/2023   HCT 34.0 (L) 04/22/2023   MCV 90.4 04/22/2023   PLT 302 04/22/2023   NEUTROABS 3.1 04/22/2023    ASSESSMENT & PLAN:  Malignant neoplasm of upper-outer quadrant of right breast in female, estrogen receptor positive (HCC) History of breast cancer: Currently on anastrozole therapy and doing well. Breast cancer surveillance:  Mammogram 03/26/2023 left breast: Benign breast density category B Breast exam 05/01/2023: Benign  Right chest wall pain: Related to prior scar tissue.  She is taking gabapentin.  I do not believe there is any concern for breast cancer recurrence.  MGUS (monoclonal gammopathy of unknown significance) Lab review: 11/14/2020: M spike: 1.9 g, IgG 2884, immunofixation shows IgG kappa Creatinine: 2.38 (baseline creatinine 1.6), calcium 9.7, albumin 4.5, hemoglobin 13.1 11/29/2020: M spike 1.6 g IgG kappa,Kappa lambda ratio: 9 (Kappa 136.9,  lambda 15.2),beta-2 microglobulin: 3.4, creatinine 2.3, hemoglobin  12 03/17/2021: M spike: 1.3 g IgG kappa, kappa lambda ratio 7.72 (Kappa 195, lambda 25), creatinine 1.95, calcium 9.9, albumin 3.8, hemoglobin 10 09/13/2021: M spike 1.6 g, IgG kappa, IgG level 2091 Kappa to 72, lambda 21.1: Ratio 12.89, creatinine 2.36, calcium 10 03/23/2022: Hemoglobin 11.8, SPEP pending 04/22/2023: Hemoglobin 10.7, beta-2 microglobulin 4.2, Kappa 235, K:L ratio 13.78, IgG 1947, M protein 1.5 g   12/09/2020: Bone survey: No lytic lesions The serum M protein levels have been fluctuating but overall they have been lower than the time of diagnosis in October 22    Acute renal failure could be related to underlying cardiac issues. Anemia due to chronic kidney disease: Stable creatinine   Return to clinic in 6 months for labs and telephone follow-up with 1 week after the labs ------------------------------------- Assessment and Plan Assessment & Plan Malignant neoplasm of upper-outer quadrant of right breast, estrogen receptor positive Currently on anastrozole, scheduled for completion by November 2023. - Continue anastrozole until November 2023.  Breast pain Chronic right breast pain likely due to scar tissue entangling nerves. Managed with gabapentin. - Consider ultrasound of the right breast to rule out underlying issues.  MGUS (Monoclonal gammopathy of unknown significance) M protein level improved from 1.7 to 1.5. Immunoglobulin levels improved. Slight anemia noted. Kidney function stable with creatinine at 2.0. - Monitor blood work every six months.      No orders of the defined types were placed in this encounter.  The patient has a good understanding of the overall plan. she agrees with it. she will call with any problems that may develop before the next visit here. Total time spent: 30 mins including face to face time and time spent for planning, charting and co-ordination of care   Tamsen Meek, MD 05/01/23

## 2023-05-01 NOTE — Assessment & Plan Note (Signed)
 Lab review: 11/14/2020: M spike: 1.9 g, IgG 2884, immunofixation shows IgG kappa Creatinine: 2.38 (baseline creatinine 1.6), calcium 9.7, albumin 4.5, hemoglobin 13.1 11/29/2020: M spike 1.6 g IgG kappa,Kappa lambda ratio: 9 (Kappa 136.9, lambda 15.2),beta-2 microglobulin: 3.4, creatinine 2.3, hemoglobin 12 03/17/2021: M spike: 1.3 g IgG kappa, kappa lambda ratio 7.72 (Kappa 195, lambda 25), creatinine 1.95, calcium 9.9, albumin 3.8, hemoglobin 10 09/13/2021: M spike 1.6 g, IgG kappa, IgG level 2091 Kappa to 72, lambda 21.1: Ratio 12.89, creatinine 2.36, calcium 10 03/23/2022: Hemoglobin 11.8, SPEP pending 04/22/2023: Hemoglobin 10.7, beta-2 microglobulin 4.2, Kappa 235, K:L ratio 13.78, IgG 1947, M protein 1.5 g   12/09/2020: Bone survey: No lytic lesions The serum M protein levels have been fluctuating but overall they have been lower than the time of diagnosis in October 22    Acute renal failure could be related to underlying cardiac issues. Anemia due to chronic kidney disease   We will obtain blood work today. Return to clinic in 6 months for labs and telephone follow-up with 1 week after the labs

## 2023-05-01 NOTE — Assessment & Plan Note (Signed)
 History of breast cancer: Currently on anastrozole therapy and doing well. Breast cancer surveillance:  Mammogram 03/26/2023 left breast: Benign breast density category B Breast exam 05/01/2023: Benign

## 2023-05-21 IMAGING — DX DG WRIST COMPLETE 3+V*L*
4 series · 4 of 4 positions shown · non-contrast
Comparison: None.

CLINICAL DATA: Left wrist pain after fall

EXAM:
LEFT WRIST - COMPLETE 3+ VIEW

[wrist pa]
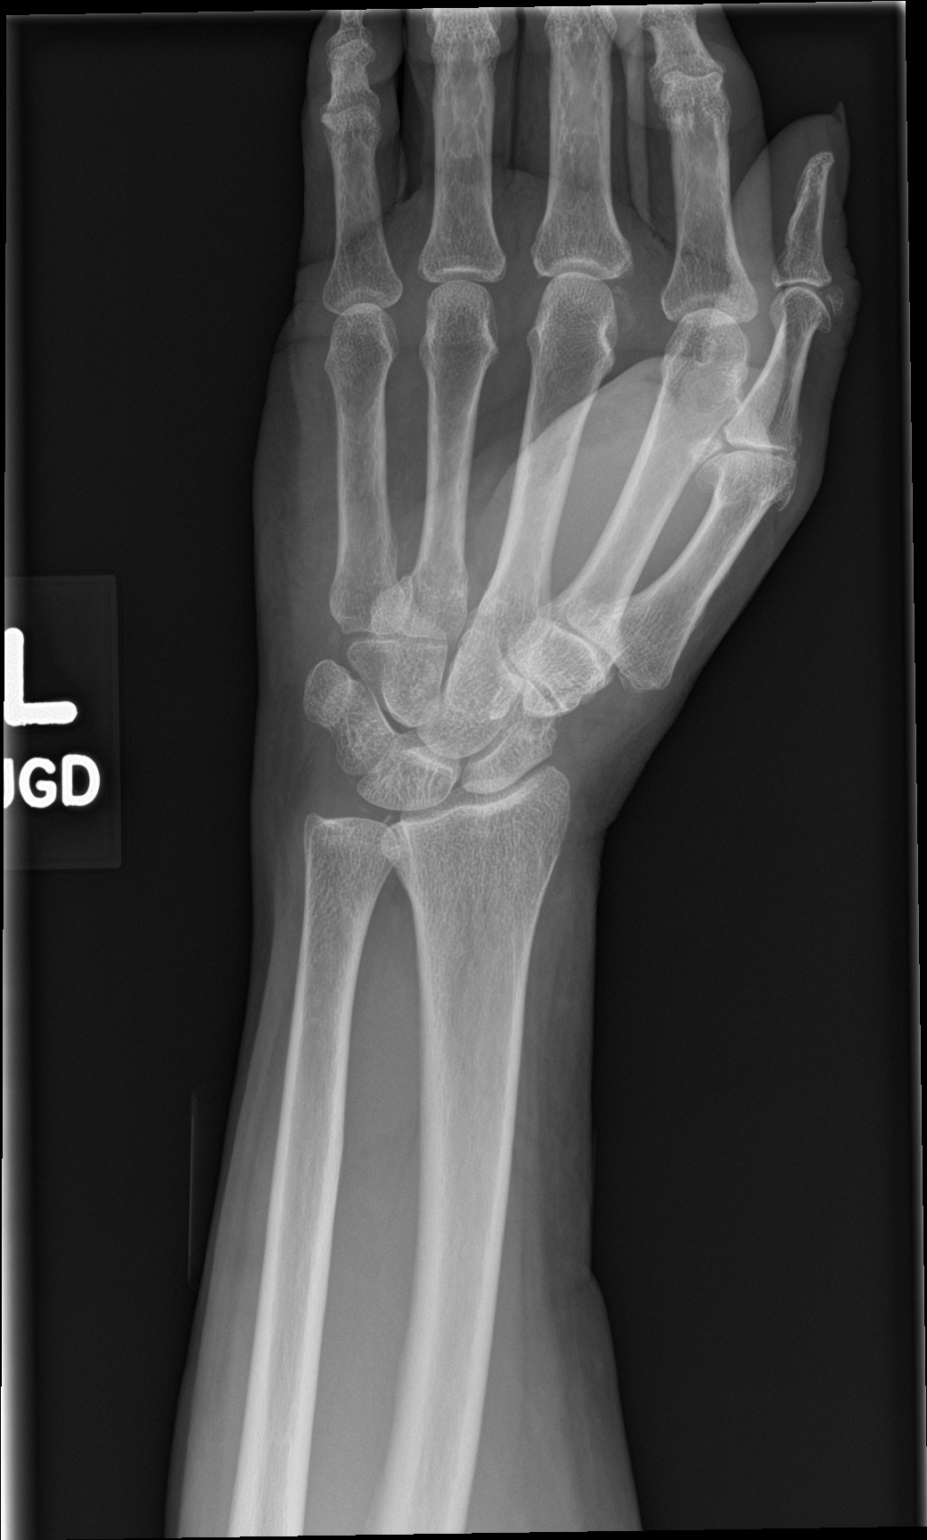

[wrist navicular]
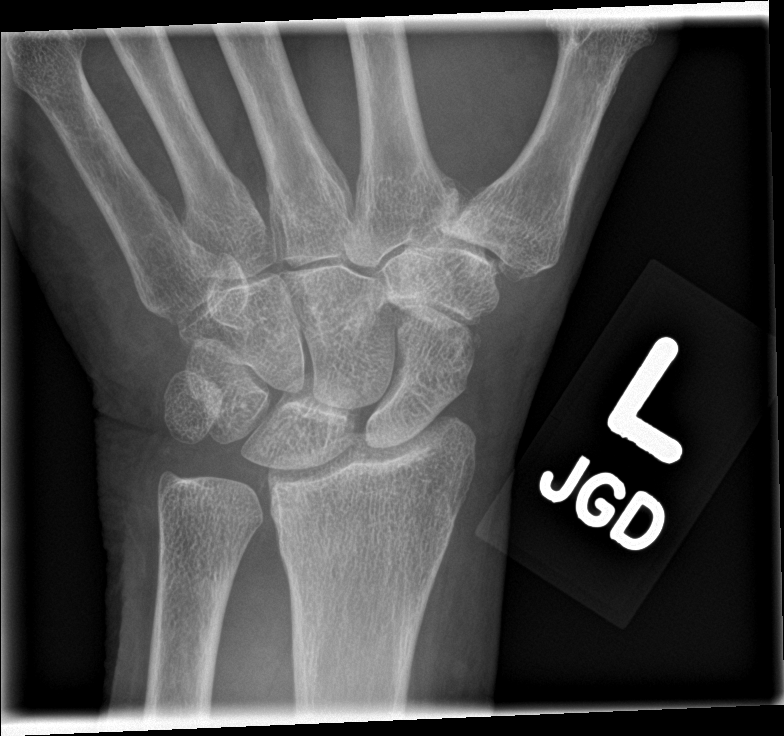

[wrist obl]
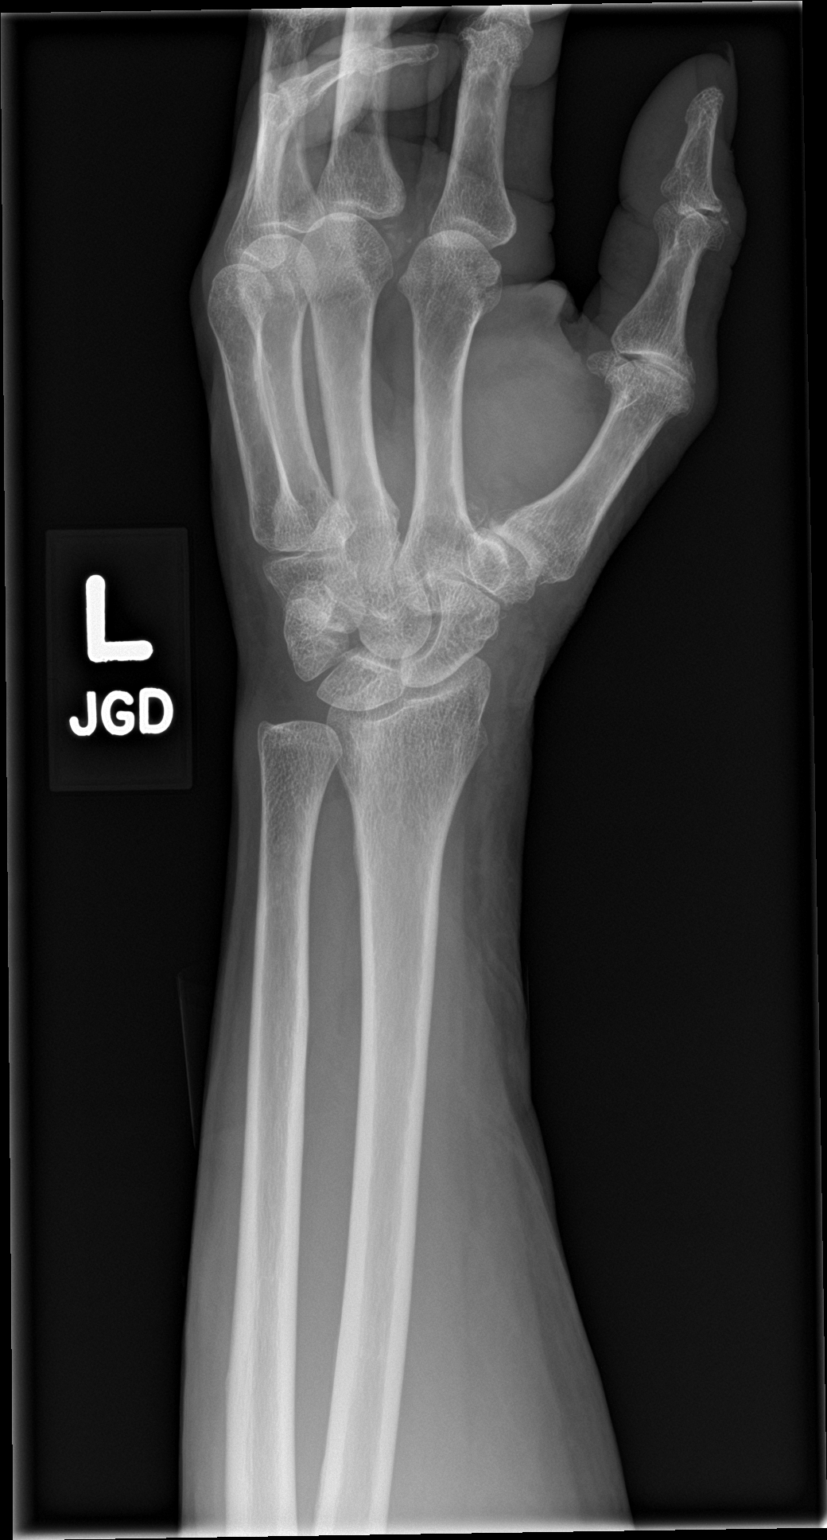

[wrist lat]
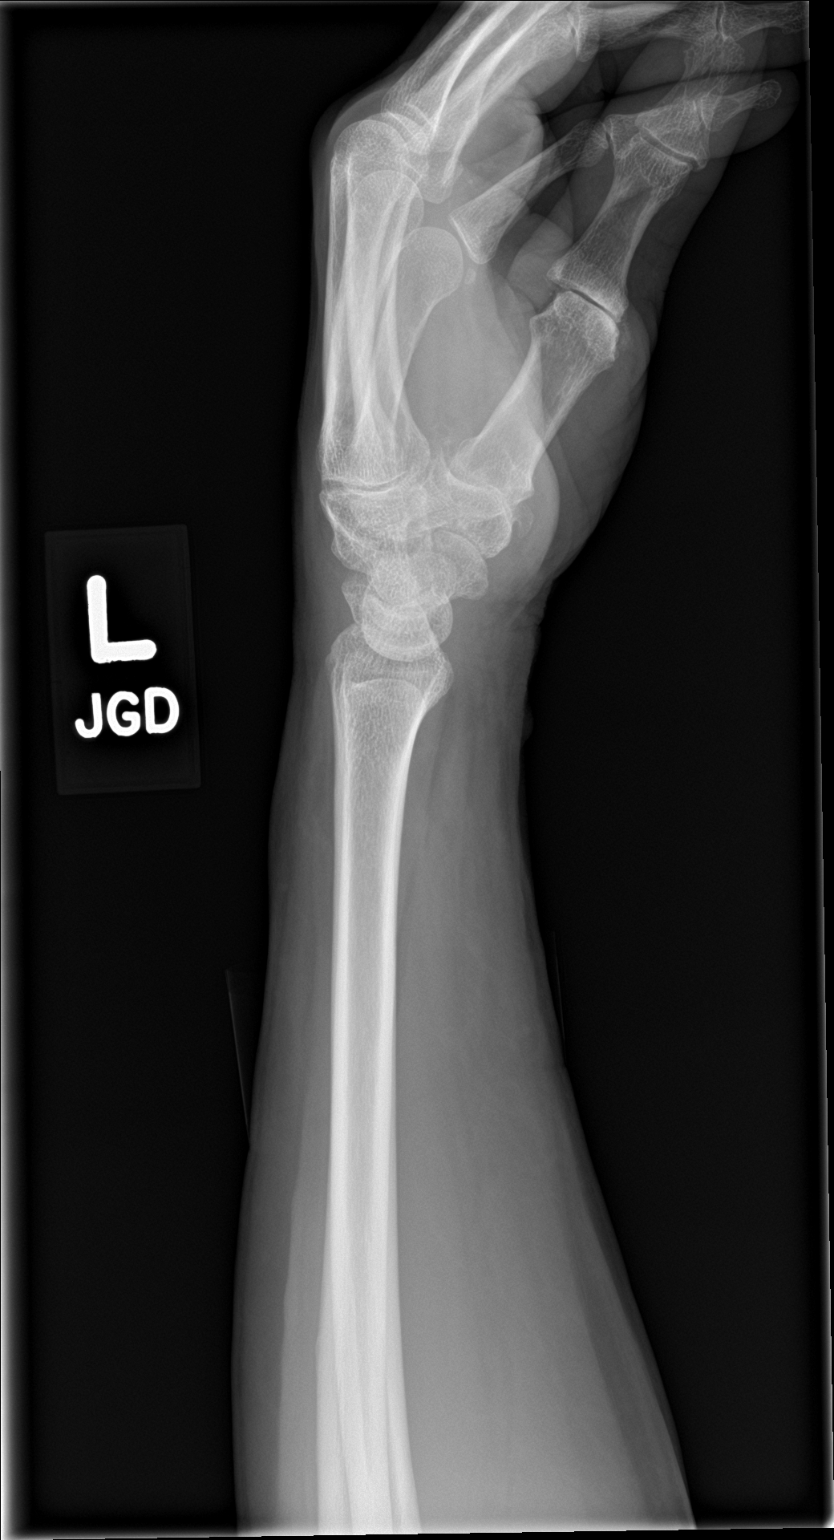

[4 of 4 positions shown; findings below may reference images not displayed]

FINDINGS: Tiny bony density along the radial margin of the distal scaphoid
pole which may reflect a tiny avulsion fracture fragment. Negative
for scaphoid waist fracture. No malalignment. Tiny mineralized
density adjacent to the distal radioulnar joint may reflect a
component of chondrocalcinosis within the TFCC. Moderate first CMC
joint osteoarthritis. Mild triscaphe joint space narrowing. Soft
tissues are unremarkable.
IMPRESSION: 1. Tiny bony density along the radial margin of the distal scaphoid
pole which may reflect a tiny avulsion fracture fragment. Correlate
with point tenderness.
2. Moderate first CMC joint osteoarthritis.

## 2023-05-21 IMAGING — DX DG HIP (WITH OR WITHOUT PELVIS) 2-3V*L*
3 series · 3 of 3 positions shown · non-contrast
Comparison: None.

CLINICAL DATA: Pain after falling down steps.

EXAM:
DG HIP (WITH OR WITHOUT PELVIS) 2-3V LEFT

[pelvis ap]
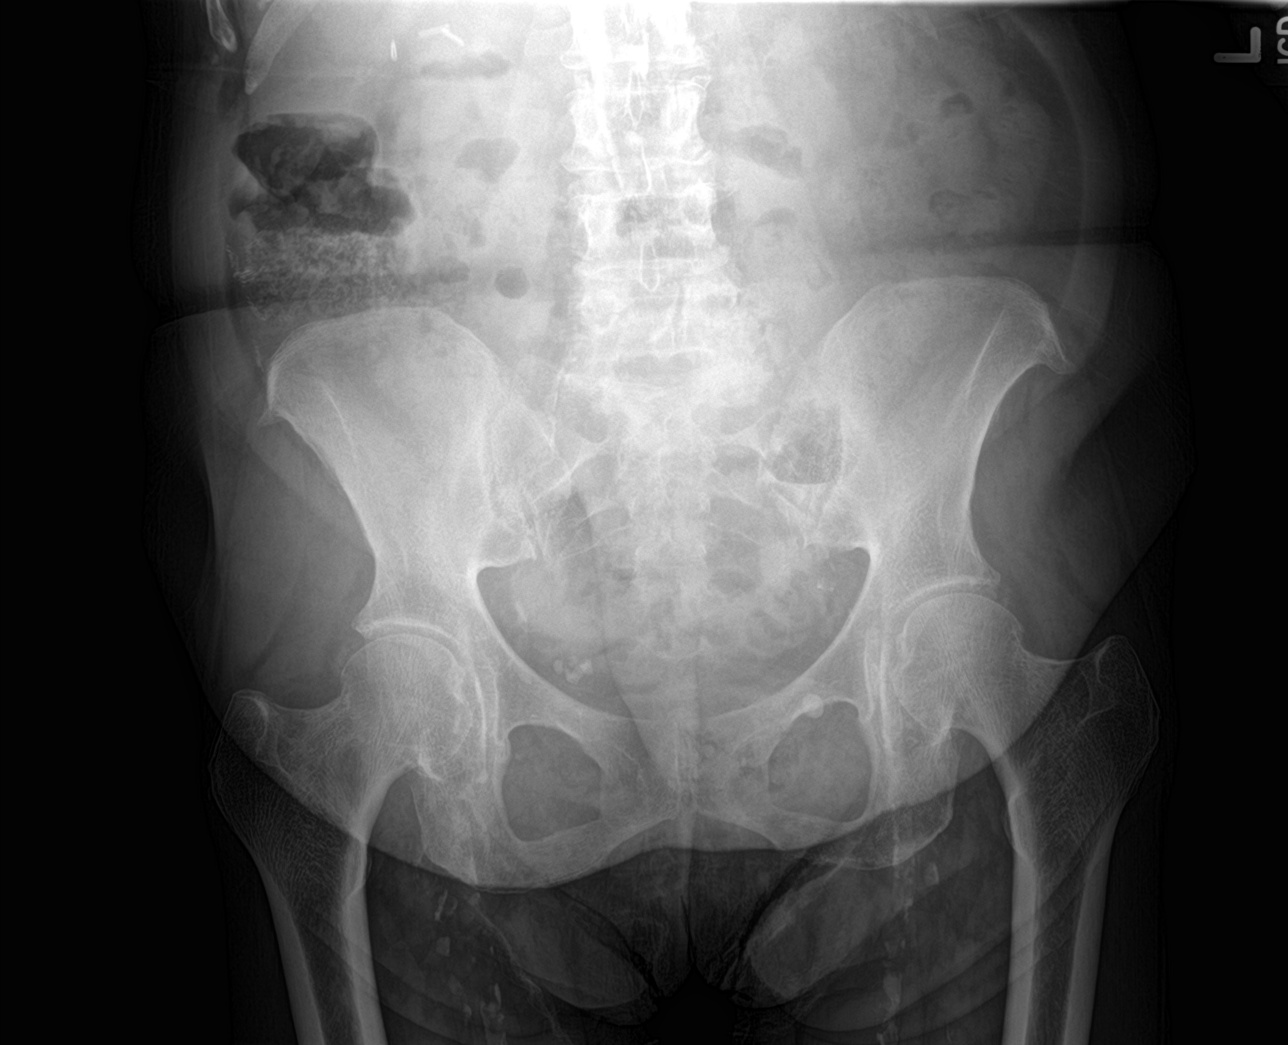

[hip ap]
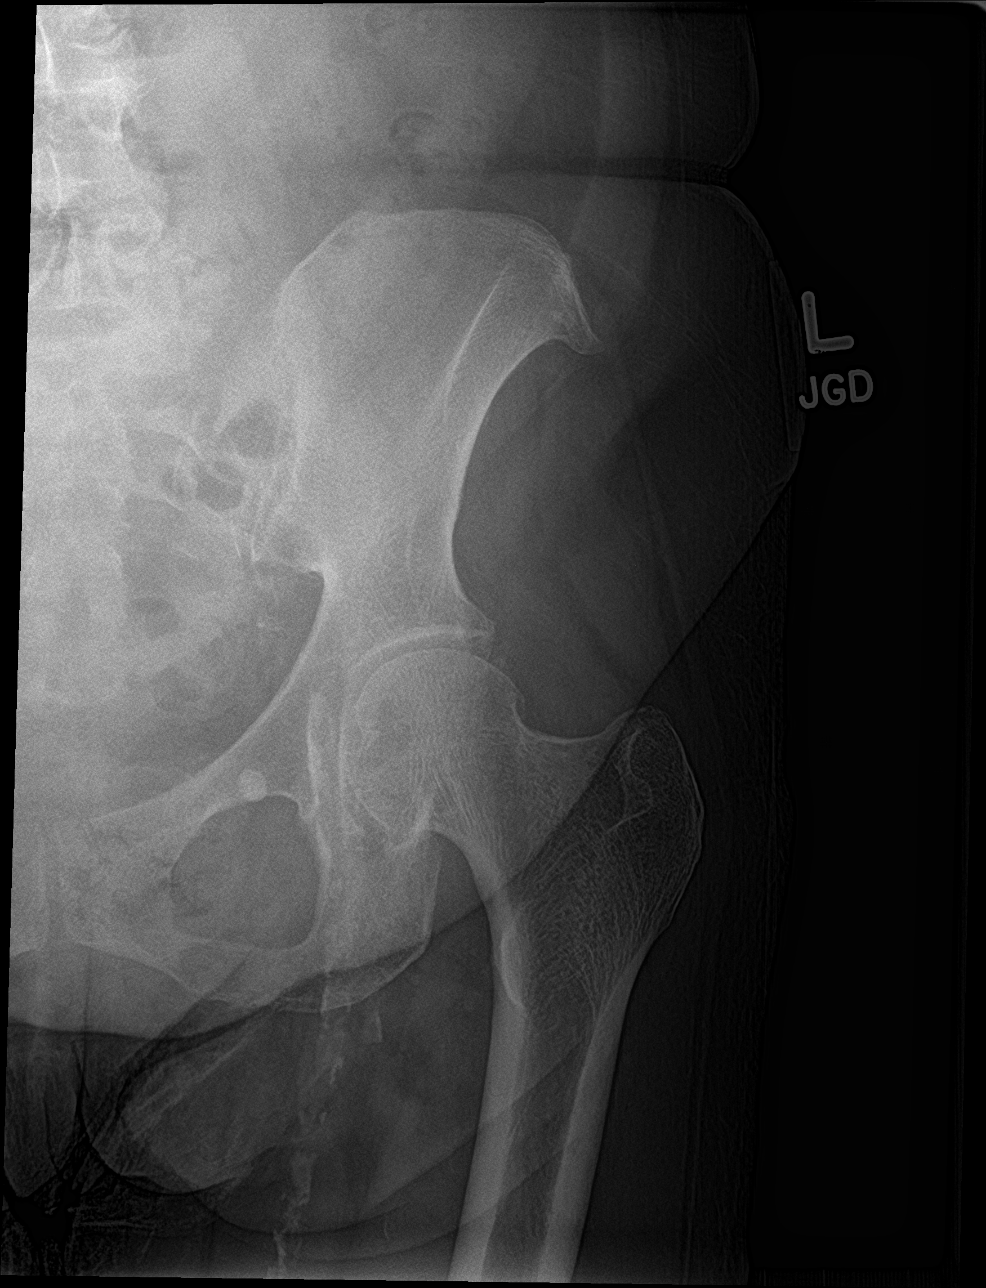

[hip lat]
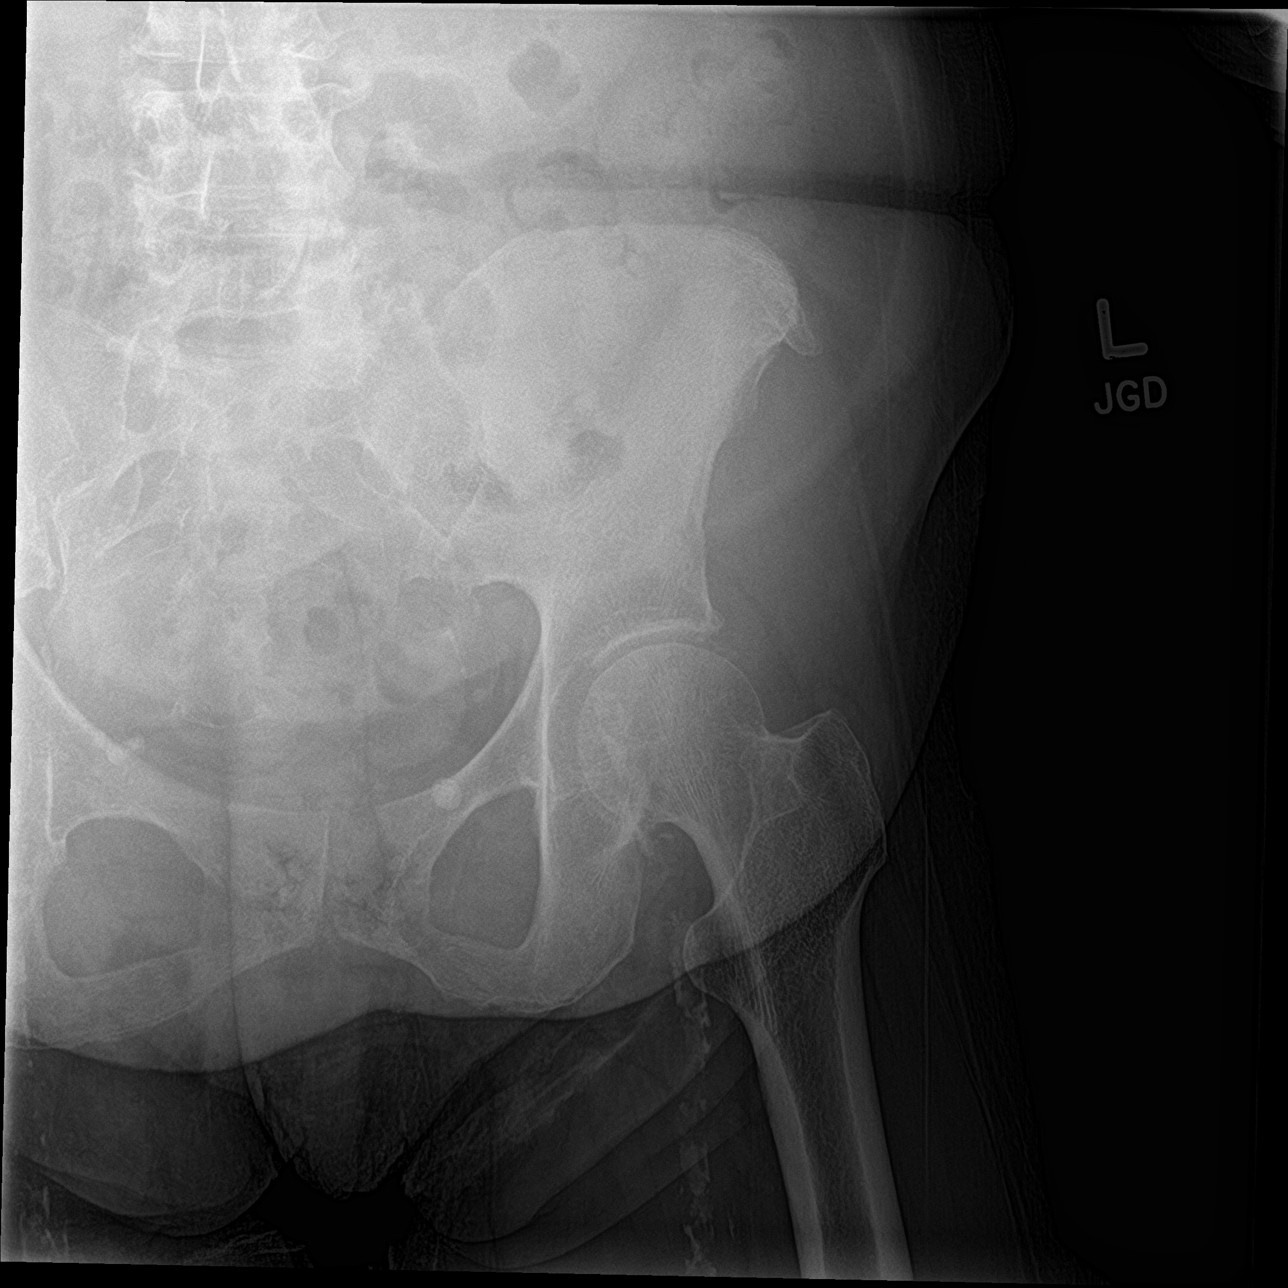

[3 of 3 positions shown; findings below may reference images not displayed]

FINDINGS: There is no evidence of hip fracture or dislocation. There is no
evidence of arthropathy or other focal bone abnormality.
IMPRESSION: Negative.

## 2023-05-21 IMAGING — DX DG SHOULDER 2+V*L*
4 series · 4 of 4 positions shown · non-contrast
Comparison: None.

CLINICAL DATA: Pain after falling down steps.

EXAM:
LEFT SHOULDER - 2+ VIEW

[shoulder ap]
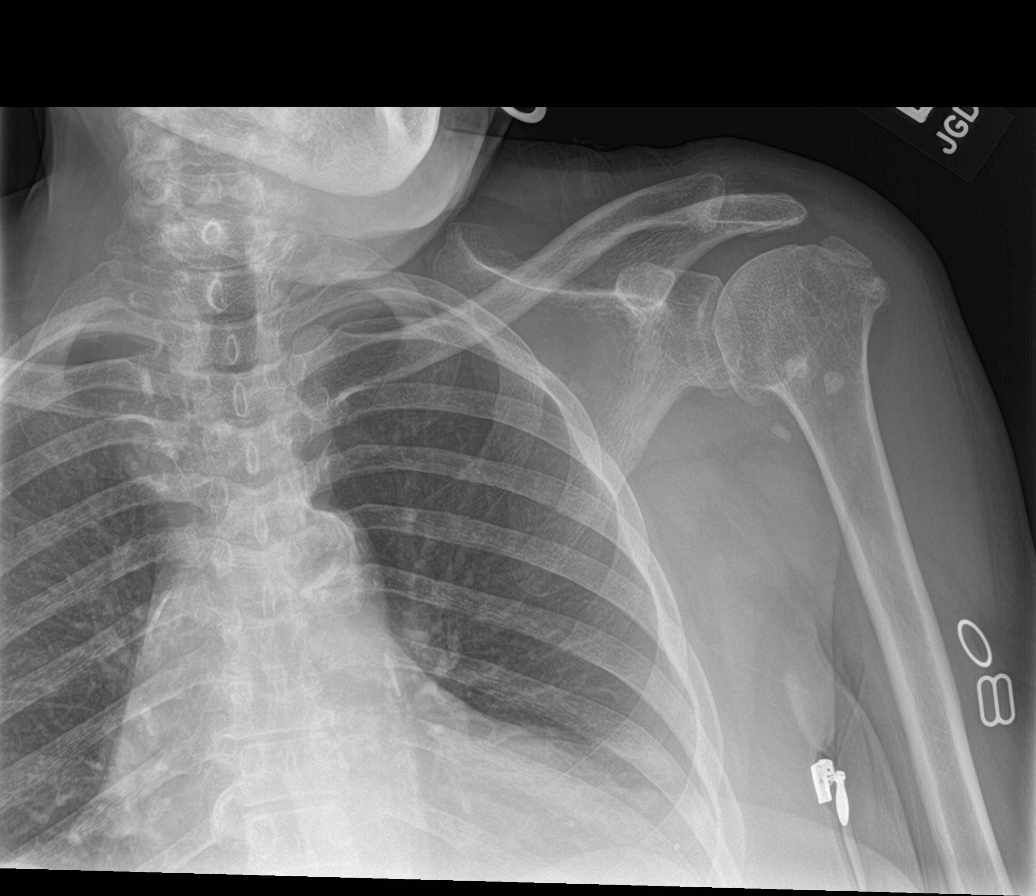

[shoulder grashey]
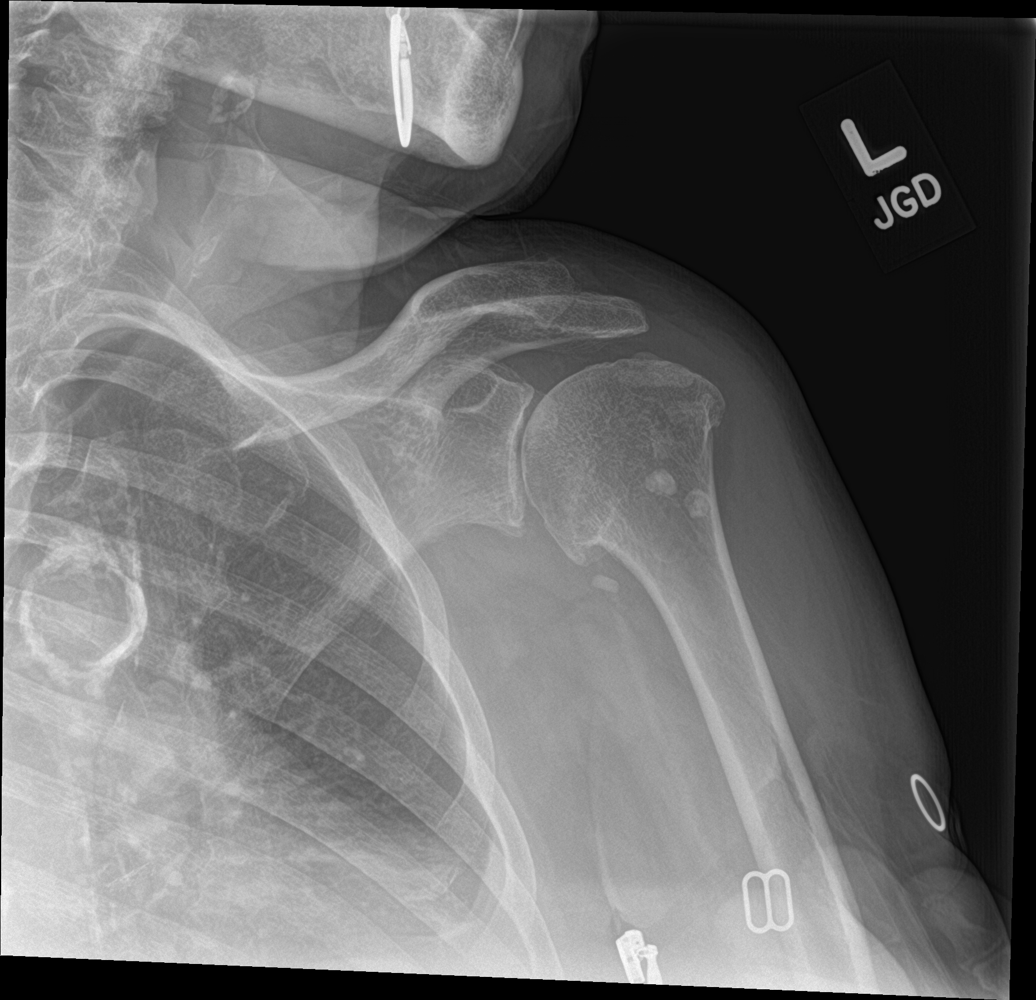

[shoulder y-view]
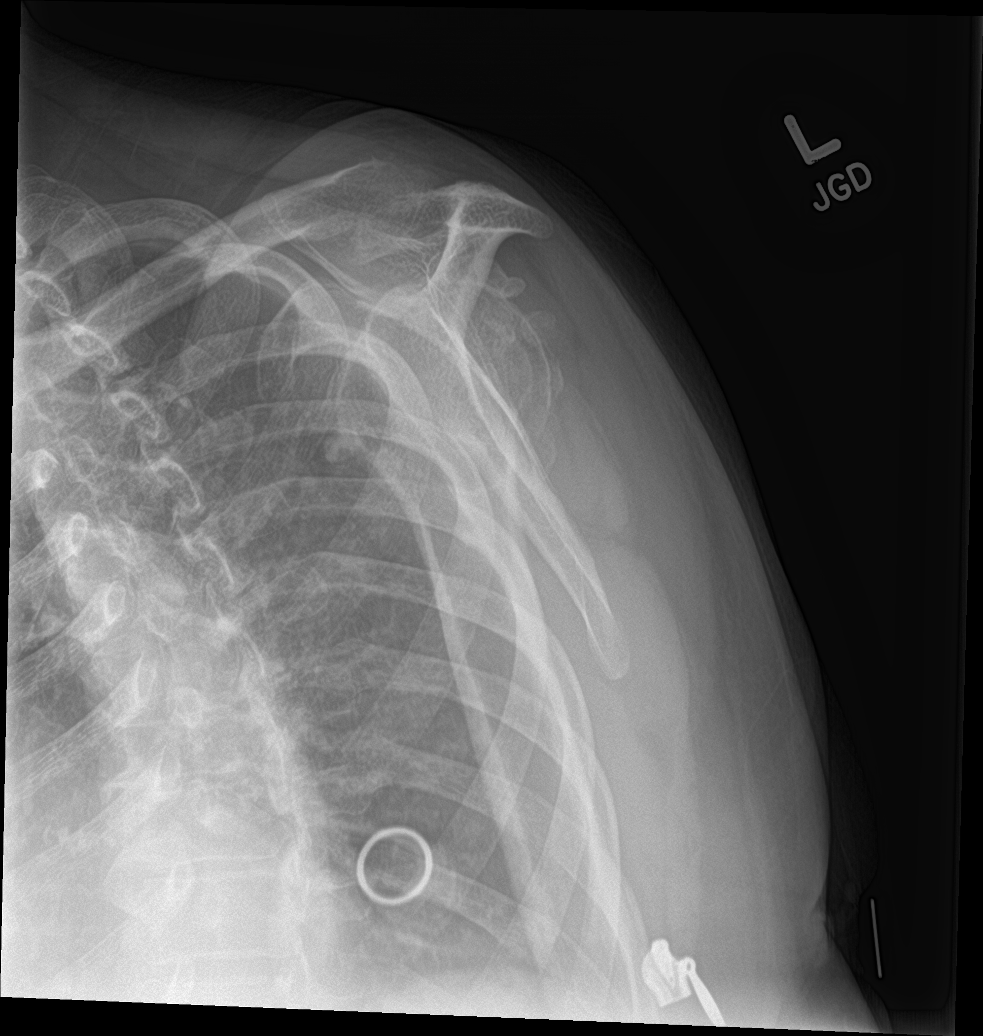

[shoulder axial]
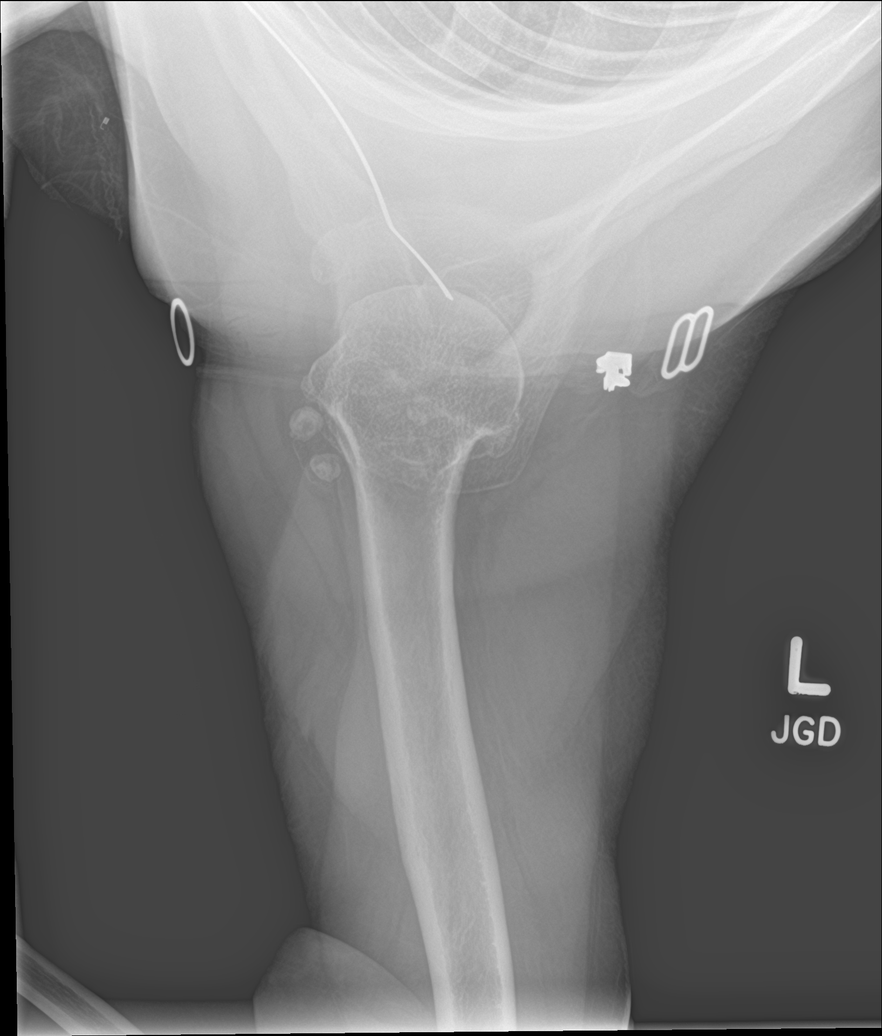

[4 of 4 positions shown; findings below may reference images not displayed]

FINDINGS: No acute fracture or dislocation identified. Moderate degenerative
changes are noted involving the glenohumeral joint. There are
several loose bodies noted within the joint space. Mild degenerative
changes are noted at the acromioclavicular joint.
IMPRESSION: 1. No acute findings.
2. Moderate glenohumeral joint osteoarthritis and mild AC joint
osteoarthritis.

## 2023-05-23 ENCOUNTER — Emergency Department (HOSPITAL_COMMUNITY)

## 2023-05-23 ENCOUNTER — Ambulatory Visit (HOSPITAL_COMMUNITY)
Admission: EM | Admit: 2023-05-23 | Discharge: 2023-05-23 | Disposition: A | Discharge status: Home / Self Care | Attending: Emergency Medicine | Admitting: Emergency Medicine

## 2023-05-23 ENCOUNTER — Ambulatory Visit (INDEPENDENT_AMBULATORY_CARE_PROVIDER_SITE_OTHER)

## 2023-05-23 ENCOUNTER — Encounter (HOSPITAL_COMMUNITY): Payer: Self-pay

## 2023-05-23 ENCOUNTER — Other Ambulatory Visit: Payer: Self-pay

## 2023-05-23 ENCOUNTER — Inpatient Hospital Stay (HOSPITAL_COMMUNITY)
Admission: EM | Admit: 2023-05-23 | Discharge: 2023-06-02 | DRG: 193 | Disposition: A | Discharge status: Home Health | Attending: Internal Medicine | Admitting: Internal Medicine

## 2023-05-23 DIAGNOSIS — R062 Wheezing: Secondary | ICD-10-CM

## 2023-05-23 DIAGNOSIS — J9621 Acute and chronic respiratory failure with hypoxia: Secondary | ICD-10-CM | POA: Diagnosis present

## 2023-05-23 DIAGNOSIS — C50411 Malignant neoplasm of upper-outer quadrant of right female breast: Secondary | ICD-10-CM

## 2023-05-23 DIAGNOSIS — J9601 Acute respiratory failure with hypoxia: Secondary | ICD-10-CM | POA: Diagnosis present

## 2023-05-23 DIAGNOSIS — Z8249 Family history of ischemic heart disease and other diseases of the circulatory system: Secondary | ICD-10-CM

## 2023-05-23 DIAGNOSIS — R058 Other specified cough: Secondary | ICD-10-CM

## 2023-05-23 DIAGNOSIS — J189 Pneumonia, unspecified organism: Secondary | ICD-10-CM | POA: Diagnosis not present

## 2023-05-23 DIAGNOSIS — Z87891 Personal history of nicotine dependence: Secondary | ICD-10-CM

## 2023-05-23 DIAGNOSIS — I2510 Atherosclerotic heart disease of native coronary artery without angina pectoris: Secondary | ICD-10-CM | POA: Diagnosis present

## 2023-05-23 DIAGNOSIS — E872 Acidosis, unspecified: Secondary | ICD-10-CM | POA: Diagnosis present

## 2023-05-23 DIAGNOSIS — K219 Gastro-esophageal reflux disease without esophagitis: Secondary | ICD-10-CM | POA: Diagnosis present

## 2023-05-23 DIAGNOSIS — R54 Age-related physical debility: Secondary | ICD-10-CM | POA: Diagnosis present

## 2023-05-23 DIAGNOSIS — D509 Iron deficiency anemia, unspecified: Secondary | ICD-10-CM | POA: Diagnosis present

## 2023-05-23 DIAGNOSIS — R0902 Hypoxemia: Secondary | ICD-10-CM | POA: Diagnosis not present

## 2023-05-23 DIAGNOSIS — Z7982 Long term (current) use of aspirin: Secondary | ICD-10-CM

## 2023-05-23 DIAGNOSIS — N179 Acute kidney failure, unspecified: Secondary | ICD-10-CM | POA: Diagnosis present

## 2023-05-23 DIAGNOSIS — J441 Chronic obstructive pulmonary disease with (acute) exacerbation: Principal | ICD-10-CM

## 2023-05-23 DIAGNOSIS — I10 Essential (primary) hypertension: Secondary | ICD-10-CM | POA: Diagnosis present

## 2023-05-23 DIAGNOSIS — Z79899 Other long term (current) drug therapy: Secondary | ICD-10-CM

## 2023-05-23 DIAGNOSIS — N183 Chronic kidney disease, stage 3 unspecified: Secondary | ICD-10-CM | POA: Diagnosis present

## 2023-05-23 DIAGNOSIS — E785 Hyperlipidemia, unspecified: Secondary | ICD-10-CM | POA: Diagnosis present

## 2023-05-23 DIAGNOSIS — D472 Monoclonal gammopathy: Secondary | ICD-10-CM | POA: Diagnosis present

## 2023-05-23 DIAGNOSIS — I959 Hypotension, unspecified: Secondary | ICD-10-CM | POA: Diagnosis present

## 2023-05-23 DIAGNOSIS — R6521 Severe sepsis with septic shock: Secondary | ICD-10-CM | POA: Clinically undetermined

## 2023-05-23 DIAGNOSIS — Z96612 Presence of left artificial shoulder joint: Secondary | ICD-10-CM | POA: Diagnosis present

## 2023-05-23 DIAGNOSIS — J449 Chronic obstructive pulmonary disease, unspecified: Secondary | ICD-10-CM | POA: Diagnosis present

## 2023-05-23 DIAGNOSIS — N1832 Chronic kidney disease, stage 3b: Secondary | ICD-10-CM | POA: Diagnosis present

## 2023-05-23 DIAGNOSIS — E1140 Type 2 diabetes mellitus with diabetic neuropathy, unspecified: Secondary | ICD-10-CM | POA: Diagnosis present

## 2023-05-23 DIAGNOSIS — Z794 Long term (current) use of insulin: Secondary | ICD-10-CM

## 2023-05-23 DIAGNOSIS — H9193 Unspecified hearing loss, bilateral: Secondary | ICD-10-CM | POA: Diagnosis present

## 2023-05-23 DIAGNOSIS — E1122 Type 2 diabetes mellitus with diabetic chronic kidney disease: Secondary | ICD-10-CM | POA: Diagnosis present

## 2023-05-23 DIAGNOSIS — Z853 Personal history of malignant neoplasm of breast: Secondary | ICD-10-CM

## 2023-05-23 DIAGNOSIS — N184 Chronic kidney disease, stage 4 (severe): Secondary | ICD-10-CM | POA: Diagnosis present

## 2023-05-23 DIAGNOSIS — Z170 Estrogen receptor positive status [ER+]: Secondary | ICD-10-CM

## 2023-05-23 DIAGNOSIS — Z7951 Long term (current) use of inhaled steroids: Secondary | ICD-10-CM

## 2023-05-23 DIAGNOSIS — D631 Anemia in chronic kidney disease: Secondary | ICD-10-CM | POA: Diagnosis present

## 2023-05-23 DIAGNOSIS — Z833 Family history of diabetes mellitus: Secondary | ICD-10-CM

## 2023-05-23 DIAGNOSIS — K760 Fatty (change of) liver, not elsewhere classified: Secondary | ICD-10-CM | POA: Diagnosis present

## 2023-05-23 DIAGNOSIS — M109 Gout, unspecified: Secondary | ICD-10-CM | POA: Diagnosis present

## 2023-05-23 DIAGNOSIS — E1165 Type 2 diabetes mellitus with hyperglycemia: Secondary | ICD-10-CM

## 2023-05-23 DIAGNOSIS — A419 Sepsis, unspecified organism: Secondary | ICD-10-CM | POA: Clinically undetermined

## 2023-05-23 DIAGNOSIS — I129 Hypertensive chronic kidney disease with stage 1 through stage 4 chronic kidney disease, or unspecified chronic kidney disease: Secondary | ICD-10-CM | POA: Diagnosis present

## 2023-05-23 DIAGNOSIS — Z79811 Long term (current) use of aromatase inhibitors: Secondary | ICD-10-CM

## 2023-05-23 DIAGNOSIS — Z955 Presence of coronary angioplasty implant and graft: Secondary | ICD-10-CM

## 2023-05-23 DIAGNOSIS — I252 Old myocardial infarction: Secondary | ICD-10-CM

## 2023-05-23 DIAGNOSIS — R0602 Shortness of breath: Secondary | ICD-10-CM | POA: Diagnosis not present

## 2023-05-23 DIAGNOSIS — F39 Unspecified mood [affective] disorder: Secondary | ICD-10-CM | POA: Diagnosis present

## 2023-05-23 DIAGNOSIS — F419 Anxiety disorder, unspecified: Secondary | ICD-10-CM | POA: Diagnosis present

## 2023-05-23 DIAGNOSIS — E118 Type 2 diabetes mellitus with unspecified complications: Secondary | ICD-10-CM | POA: Diagnosis present

## 2023-05-23 LAB — CBC WITH DIFFERENTIAL/PLATELET
Abs Immature Granulocytes: 0.07 10*3/uL (ref 0.00–0.07)
Basophils Absolute: 0.1 10*3/uL (ref 0.0–0.1)
Basophils Relative: 1 %
Eosinophils Absolute: 0.4 10*3/uL (ref 0.0–0.5)
Eosinophils Relative: 4 %
HCT: 36.6 % (ref 36.0–46.0)
Hemoglobin: 11.3 g/dL — ABNORMAL LOW (ref 12.0–15.0)
Immature Granulocytes: 1 %
Lymphocytes Relative: 23 %
Lymphs Abs: 2.3 10*3/uL (ref 0.7–4.0)
MCH: 28.6 pg (ref 26.0–34.0)
MCHC: 30.9 g/dL (ref 30.0–36.0)
MCV: 92.7 fL (ref 80.0–100.0)
Monocytes Absolute: 0.9 10*3/uL (ref 0.1–1.0)
Monocytes Relative: 9 %
Neutro Abs: 6.2 10*3/uL (ref 1.7–7.7)
Neutrophils Relative %: 62 %
Platelets: 296 10*3/uL (ref 150–400)
RBC: 3.95 MIL/uL (ref 3.87–5.11)
RDW: 14.4 % (ref 11.5–15.5)
WBC: 9.9 10*3/uL (ref 4.0–10.5)
nRBC: 0 % (ref 0.0–0.2)

## 2023-05-23 LAB — POC RSV: RSV Antigen, POC: NEGATIVE

## 2023-05-23 LAB — TROPONIN I (HIGH SENSITIVITY)
Troponin I (High Sensitivity): 7 ng/L (ref <18)
Troponin I (High Sensitivity): 7 ng/L (ref <18)

## 2023-05-23 LAB — POC COVID19/FLU A&B COMBO
Covid Antigen, POC: NEGATIVE
Influenza A Antigen, POC: NEGATIVE
Influenza B Antigen, POC: NEGATIVE

## 2023-05-23 LAB — BRAIN NATRIURETIC PEPTIDE: B Natriuretic Peptide: 21.5 pg/mL (ref 0.0–100.0)

## 2023-05-23 LAB — BASIC METABOLIC PANEL WITH GFR
Anion gap: 12 (ref 5–15)
BUN: 27 mg/dL — ABNORMAL HIGH (ref 8–23)
CO2: 23 mmol/L (ref 22–32)
Calcium: 9.7 mg/dL (ref 8.9–10.3)
Chloride: 102 mmol/L (ref 98–111)
Creatinine, Ser: 2.39 mg/dL — ABNORMAL HIGH (ref 0.44–1.00)
GFR, Estimated: 21 mL/min — ABNORMAL LOW (ref >60)
Glucose, Bld: 185 mg/dL — ABNORMAL HIGH (ref 70–99)
Potassium: 3.6 mmol/L (ref 3.5–5.1)
Sodium: 137 mmol/L (ref 135–145)

## 2023-05-23 MED ORDER — ALBUTEROL SULFATE (2.5 MG/3ML) 0.083% IN NEBU
5.0000 mg | INHALATION_SOLUTION | Freq: Once | RESPIRATORY_TRACT | Status: AC
  Administered 2023-05-23: 5 mg via RESPIRATORY_TRACT
  Filled 2023-05-23: qty 6

## 2023-05-23 MED ORDER — SODIUM CHLORIDE 0.9 % IV BOLUS
500.0000 mL | Freq: Once | INTRAVENOUS | Status: AC
  Administered 2023-05-23: 500 mL via INTRAVENOUS

## 2023-05-23 MED ORDER — SODIUM CHLORIDE 0.9 % IV SOLN
500.0000 mg | Freq: Once | INTRAVENOUS | Status: AC
  Administered 2023-05-23: 500 mg via INTRAVENOUS
  Filled 2023-05-23: qty 5

## 2023-05-23 MED ORDER — MAGNESIUM SULFATE 2 GM/50ML IV SOLN
2.0000 g | Freq: Once | INTRAVENOUS | Status: AC
  Administered 2023-05-23: 2 g via INTRAVENOUS
  Filled 2023-05-23: qty 50

## 2023-05-23 MED ORDER — IPRATROPIUM-ALBUTEROL 0.5-2.5 (3) MG/3ML IN SOLN
RESPIRATORY_TRACT | Status: AC
  Filled 2023-05-23: qty 3

## 2023-05-23 MED ORDER — SODIUM CHLORIDE 0.9 % IV SOLN
1.0000 g | Freq: Once | INTRAVENOUS | Status: AC
  Administered 2023-05-23: 1 g via INTRAVENOUS
  Filled 2023-05-23: qty 10

## 2023-05-23 MED ORDER — IPRATROPIUM-ALBUTEROL 0.5-2.5 (3) MG/3ML IN SOLN
3.0000 mL | Freq: Once | RESPIRATORY_TRACT | Status: AC
  Administered 2023-05-23: 3 mL via RESPIRATORY_TRACT

## 2023-05-23 MED ORDER — METHYLPREDNISOLONE SODIUM SUCC 125 MG IJ SOLR
125.0000 mg | Freq: Once | INTRAMUSCULAR | Status: AC
  Administered 2023-05-23: 125 mg via INTRAVENOUS
  Filled 2023-05-23: qty 2

## 2023-05-23 MED ORDER — ALBUTEROL SULFATE (2.5 MG/3ML) 0.083% IN NEBU
10.0000 mg | INHALATION_SOLUTION | Freq: Once | RESPIRATORY_TRACT | Status: AC
  Administered 2023-05-23: 10 mg via RESPIRATORY_TRACT
  Filled 2023-05-23: qty 12

## 2023-05-23 MED ORDER — LORAZEPAM 2 MG/ML IJ SOLN
0.5000 mg | Freq: Once | INTRAMUSCULAR | Status: AC
  Administered 2023-05-23: 0.5 mg via INTRAVENOUS
  Filled 2023-05-23: qty 1

## 2023-05-23 NOTE — ED Notes (Signed)
 Patient has notified family of plan to go to ED

## 2023-05-23 NOTE — ED Triage Notes (Signed)
 Pt [facility] from [facility] c/o SOB and productive cough since Sunday. Pt reportedly had hypoxic episode at [facility]. Given one Duoneb d/t wheezing and rhonici PTA. Pt also endorses CP when coughing. Pt had neg covid/flu/rsv at [facility].

## 2023-05-23 NOTE — ED Provider Notes (Signed)
 [facility]    CSN: [id number] Arrival date & time: 05/23/23  1452    HISTORY   Chief Complaint  Patient presents with   Cough   HPI [name] is a pleasant, 73 y.o. female who presents to urgent care today. Patient complains of a cough productive of green sputum, pain in her throat and chest when she is coughing, pain in her back when she is coughing, feeling short of breath and audibly wheezing for the past 24 hours.  Patient states she has been using her inhaler and doing albuterol nebulizer treatments without meaningful relief.  Patient has an SpO2 of 91% on arrival today with a mildly elevated temperature and otherwise normal vital signs.  The history is provided by the patient.   Past Medical History:  Diagnosis Date   Ambulates with cane    straight   Angina    no current problems per patient at PAT appt 11/05/18, nitroglycerin sl 06/30/20   Arthritis    HANDS"   Asthma    Breast cancer (HCC)    Cancer (HCC) 09/04/2017   Right Breast   Carpal tunnel syndrome, bilateral 11/26/2016   Cholelithiasis    Cocaine abuse (HCC) 07/2012   per E.R[place] drug screen   COPD (chronic obstructive pulmonary disease) (HCC)    per patient   Coronary artery disease    Diabetes mellitus without mention of complication    type 2   Dysphagia    esophageal dysmotility on 03/2011 esophagram    Fatty liver disease, nonalcoholic 2009   on Imaging.    GERD (gastroesophageal reflux disease)    Headache    Hearing loss    bilateral - no hearing aids   History of colonic polyps 2009   adenomatous 2009, HP in 2009, 2011, 2013.    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   MGUS (monoclonal gammopathy of unknown significance) 11/29/2020   ESRD (end stage renal disease) (HCC) 03/15/2020   Insulin dependent type 2 diabetes mellitus (HCC) 12/09/2018   COPD (chronic obstructive pulmonary disease) (HCC) 12/09/2018   Chronic cholecystitis 11/11/2018   Hemobilia    Dilated bile duct    Jaundice    Elevated LFTs    Choledocholithiasis 10/02/2018   CKD (chronic kidney disease), stage III    Acute cholecystitis    Elevated liver enzymes 09/18/2018   Calculus of gallbladder and bile duct with acute cholecystitis, with obstruction    Melena    Unstable angina (HCC) 07/12/2018   Port-A-Cath in place 02/27/2018   Cancer of overlapping sites of right breast (HCC) 11/27/2017   Malignant neoplasm of upper-outer quadrant of right breast in female, estrogen receptor positive (HCC) 10/03/2017   Carpal tunnel syndrome, bilateral 11/26/2016   Ulnar neuropathy at elbow, 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   Abdominal pain, epigastric    Acute on chronic renal failure (HCC) 02/19/2015   Diarrhea    Nausea with vomiting    Hyperglycemia 04/01/2014   Hyperosmolar non-ketotic state in patient with type 2 diabetes mellitus (HCC) 04/01/2014  Nausea vomiting and diarrhea 04/01/2014   CAD (coronary artery disease) 04/01/2014   Dehydration    Diabetic ketoacidosis without coma associated with diabetes mellitus due to underlying condition (HCC)    High anion gap metabolic acidosis    Cocaine abuse (HCC)    Toxic encephalopathy-Unlikely metabolic,?psychogenic 08/02/2012   Bereavement 02/02/2012   Depression 02/02/2012   Iron deficiency anemia, unspecified 11/30/2011   Nonspecific abnormal finding in stool contents 11/30/2011   Benign neoplasm of colon 11/30/2011   Cough 05/14/2011   Chest pain 03/19/2011   Macrocytic anemia 03/19/2011   Type 2 diabetes mellitus with complication, without long-term current use of insulin  (HCC) 03/19/2011   DOE (dyspnea on exertion) 03/19/2011   Chest pain at rest 02/18/2011    Class: Acute   Cholelithiasis 11/01/2010   DM 04/08/2009   HYPERLIPIDEMIA 04/08/2009   HTN (hypertension) 04/08/2009   Acute myocardial infarction [facility]) 04/08/2009   Coronary atherosclerosis 04/08/2009   GERD 04/08/2009   FATTY LIVER DISEASE 04/08/2009   History of colonic polyps 04/08/2009   Past Surgical History:  Procedure Laterality Date   ABDOMINAL ANGIOGRAM  02/20/2011   Procedure: ABDOMINAL ANGIOGRAM;  Surgeon: Sharene Dauer, MD;  Location: [facility] CATH LAB;  Service: Cardiovascular;;   BREAST BIOPSY Left    CARPAL TUNNEL RELEASE Right 07/04/2017   Procedure: RIGHT CARPAL TUNNEL RELEASE;  Surgeon: Brunilda Capra, MD;  Location: [facility];  Service: Orthopedics;  Laterality: Right;   CARPAL TUNNEL RELEASE Left 09/12/2017   Procedure: LEFT CARPAL TUNNEL RELEASE;  Surgeon: Brunilda Capra, MD;  Location: [facility];  Service: Orthopedics;  Laterality: Left;   CATARACT EXTRACTION     CHOLECYSTECTOMY N/A 11/11/2018   Procedure: ATTEMPTED LAPAROSCOPIC CHOLECUSTECOMY,  OPEN CHOLECYSTECTOMY;  Surgeon: Oza Blumenthal, MD;  Location: [facility];  Service: General;  Laterality: N/A;   COLONOSCOPY  11/30/2011   Procedure: COLONOSCOPY;  Surgeon: Pietro Bridegroom, MD;  Location: [facility];  Service: Endoscopy;  Laterality: N/A;   CORONARY ANGIOPLASTY WITH STENT PLACEMENT  02/20/2011   ERCP N/A 10/03/2018   Procedure: ENDOSCOPIC RETROGRADE CHOLANGIOPANCREATOGRAPHY (ERCP);  Surgeon: Asencion Blacksmith, MD;  Location: [facility];  Service: Endoscopy;  Laterality: N/A;   ESOPHAGOGASTRODUODENOSCOPY  11/30/2011   Procedure: ESOPHAGOGASTRODUODENOSCOPY (EGD);  Surgeon: Pietro Bridegroom, MD;  Location: [facility];  Service: Endoscopy;  Laterality: N/A;   ESOPHAGOGASTRODUODENOSCOPY N/A 02/23/2015   Procedure: ESOPHAGOGASTRODUODENOSCOPY (EGD);  Surgeon: Kenney Peacemaker, MD;  Location: [facility]  ENDOSCOPY;  Service: Endoscopy;  Laterality: N/A;   EYE SURGERY Bilateral    cataracts removed   INTRAOPERATIVE CHOLANGIOGRAM N/A 11/11/2018   Procedure: Intraoperative Cholangiogram;  Surgeon: Oza Blumenthal, MD;  Location: [facility];  Service: General;  Laterality: N/A;   IR REMOVAL TUN ACCESS W/ PORT W/O FL MOD SED  06/30/2020   LEFT HEART CATHETERIZATION WITH CORONARY ANGIOGRAM N/A 02/20/2011   Procedure: LEFT HEART CATHETERIZATION WITH CORONARY ANGIOGRAM;  Surgeon: Sharene Dauer, MD;  Location: [facility];  Service: Cardiovascular;  Laterality: N/A;   MASTECTOMY Right 11/05/2017   MASTECTOMY W/ SENTINEL NODE BIOPSY Right 11/27/2017   MASTECTOMY W/ SENTINEL NODE BIOPSY Right 11/27/2017   Procedure: RIGHT TOTAL MASTECTOMY WITH SENTINEL LYMPH NODE BIOPSY;  Surgeon: Ayesha Lente, MD;  Location: [facility];  Service: General;  Laterality: Right;   PORT A CATH REVISION Left 05/07/2018   Procedure: PORT A CATH REVISION;  Surgeon: Ayesha Lente, MD;  Location: [facility]S;  Service: General;  Laterality: Left;   PORTACATH PLACEMENT  Left 11/27/2017   Procedure: INSERTION PORT-A-CATH;  Surgeon: Ayesha Lente, MD;  Location: [facility] OR;  Service: General;  Laterality: Left;   REVERSE SHOULDER ARTHROPLASTY Left 03/09/2021   Procedure: REVERSE SHOULDER ARTHROPLASTY;  Surgeon: Ellard Gunning, MD;  Location: [facility];  Service: Orthopedics;  Laterality: Left;   RIGHT COLECTOMY  02/2007   for post polypectomy colonic perforation   SPHINCTEROTOMY  10/03/2018   Procedure: SPHINCTEROTOMY;  Surgeon: Asencion Blacksmith, MD;  Location: [facility];  Service: Endoscopy;;   TUBAL LIGATION     OB History   No obstetric history on file.    Home Medications    Prior to Admission medications   Medication Sig Start Date End Date Taking? Authorizing Provider  acetaminophen (TYLENOL) 650 MG CR tablet Take 650 mg by mouth every 8 (eight) hours as needed for pain.    [provider]  albuterol  (PROVENTIL) (2.5 MG/3ML) 0.083% nebulizer solution Take 3 mLs (2.5 mg total) by nebulization every 6 (six) hours as needed for wheezing or shortness of breath. 08/02/21   Angelia Kelp, PA-C  albuterol (VENTOLIN HFA) 108 (90 Base) MCG/ACT inhaler Inhale 1-2 puffs into the lungs every 6 (six) hours as needed for wheezing or shortness of breath. 09/14/21   Hassie Lint, PA-C  allopurinol (ZYLOPRIM) 100 MG tablet Take 100 mg by mouth daily.    [provider]  amLODipine (NORVASC) 5 MG tablet Take 5 mg by mouth daily.    [provider]  anastrozole (ARIMIDEX) 1 MG tablet TAKE ONE TABLET BY MOUTH ONCE DAILY 06/18/22   Gudena, Vinay, MD  aspirin 81 MG EC tablet Take 1 tablet (81 mg total) by mouth daily. 10/13/18   Pokhrel, Laxman, MD  atorvastatin (LIPITOR) 20 MG tablet Take 20 mg by mouth daily.    [provider]  benzonatate (TESSALON) 100 MG capsule Take 1 capsule (100 mg total) by mouth 3 (three) times daily as needed. 08/02/21   Angelia Kelp, PA-C  Blood Pressure Monitor DEVI Please provide patient with insurance approved blood pressure monitor. I10.0 11/15/20   Fleming, Zelda W, NP  busPIRone (BUSPAR) 10 MG tablet Take 10 mg by mouth 2 (two) times daily.    [provider]  diclofenac Sodium (VOLTAREN) 1 % GEL Apply 1 Application topically 4 (four) times daily. Patient taking differently: Apply 1 Application topically in the morning and at bedtime. 09/13/21   Gudena, Vinay, MD  DULoxetine (CYMBALTA) 30 MG capsule Take 30 mg by mouth daily.    [provider]  ferrous sulfate 324 MG TBEC Take 324 mg by mouth daily.    [provider]  fluticasone (FLONASE) 50 MCG/ACT nasal spray Place 2 sprays into both nostrils daily. 05/10/21   Newlin, Enobong, MD  fluticasone furoate-vilanterol (BREO ELLIPTA) 200-25 MCG/ACT AEPB Inhale 1 puff into the lungs daily. Must have office visit for refills 03/28/22   Newlin, Enobong, MD  gabapentin  (NEURONTIN) 100 MG capsule Take 100-200 mg by mouth See admin instructions. Take one tablet by mouth in the morning and afternoon, then take 2 tablets every night per patient 03/12/22   [provider]  Insulin Pen Needle (EASY TOUCH PEN NEEDLES) 31G X 8 MM MISC Use to inject insulin once daily and Victoza once daily. Max of 2 pen needles a day. Must have office visit for refills 03/28/22   Newlin, Enobong, MD  losartan (COZAAR) 50 MG tablet TAKE ONE TABLET BY MOUTH DAILY. STOP 25mg  DOSE 08/22/21  Claiborne Rigg, NP  methocarbamol (ROBAXIN) 500 MG tablet Take 1 tablet (500 mg total) by mouth 2 (two) times daily. 03/09/23   Prosperi, Christian H, PA-C  metoprolol succinate (TOPROL-XL) 50 MG 24 hr tablet Take 1 tablet (50 mg total) by mouth daily. Must have office visit for refills 03/28/22   Hoy Register, MD  Misc. Devices MISC Please provide patient with nebulizer mask and tubing. [id number] 12/14/19   Claiborne Rigg, NP  nitroGLYCERIN (NITROSTAT) 0.4 MG SL tablet Place 1 tablet (0.4 mg total) under the tongue every 5 (five) minutes as needed for chest pain. 03/15/20   Claiborne Rigg, NP  ondansetron (ZOFRAN-ODT) 4 MG disintegrating tablet Take 4 mg by mouth every 8 (eight) hours as needed for nausea or vomiting.    [provider]  pantoprazole (PROTONIX) 40 MG tablet TAKE ONE TABLET BY MOUTH EVERY DAY FOR ACID REFLUX 11/02/21   Claiborne Rigg, NP  predniSONE (DELTASONE) 20 MG tablet Take 2 tablets (40 mg total) by mouth daily. 03/09/23   Prosperi, Ephriam Knuckles H, PA-C  Respiratory Therapy Supplies (NEBULIZER MASK ADULT) MISC 1 Units by Does not apply route daily as needed. ICD 10 J44.9 05/08/21   Claiborne Rigg, NP  SYMBICORT 160-4.5 MCG/ACT inhaler Inhale 2 puffs into the lungs in the morning and at bedtime. 02/28/22   [provider]    Family History Family History  Problem Relation Age of Onset   Heart disease Mother    Diabetes Mother    Cancer Father        unsure  what kind   Diabetes Sister    Colon cancer Neg Hx    Breast cancer Neg Hx    Social History Social History   Tobacco Use   Smoking status: Former    Current packs/day: 0.00    Average packs/day: 0.3 packs/day for 47.0 years (11.8 ttl pk-yrs)    Types: Cigarettes    Start date: 05/09/1963    Quit date: 05/09/2010    Years since quitting: 13.0   Smokeless tobacco: Never  Vaping Use   Vaping status: Never Used  Substance Use Topics   Alcohol use: Not Currently   Drug use: Not Currently    Types: Cocaine    Comment: positive drug screen (cocaine, benzo's)07/2012 in emergency room   Allergies   Patient has no known allergies.  Review of Systems Review of Systems Pertinent findings revealed after performing a 14 point review of systems has been noted in the history of present illness.  Physical Exam Vital Signs BP 96/65 (BP Location: Left Arm)   Pulse 98   Temp 99.1 F (37.3 C) (Oral)   Resp (!) 32   LMP  (LMP Unknown) Comment: tubal ligation  SpO2 (!) 88%   No data found.  Physical Exam Vitals and nursing note reviewed.  Constitutional:      General: She is awake. She is not in acute distress.    Appearance: Normal appearance. She is ill-appearing.  HENT:     Head: Normocephalic and atraumatic.     Salivary Glands: Right salivary gland is not diffusely enlarged or tender. Left salivary gland is not diffusely enlarged or tender.     Right Ear: Hearing, tympanic membrane, ear canal and external ear normal. No drainage. No middle ear effusion. There is no impacted cerumen. Tympanic membrane is not erythematous or bulging.     Left Ear: Hearing, tympanic membrane, ear canal and external ear normal. No drainage.  No middle ear effusion. There is no impacted cerumen. Tympanic membrane is not erythematous or bulging.     Nose: Rhinorrhea present. No nasal deformity, septal deviation, mucosal edema or congestion. Rhinorrhea is clear.     Right Turbinates: Not enlarged, swollen  or pale.     Left Turbinates: Not enlarged, swollen or pale.     Right Sinus: No maxillary sinus tenderness or frontal sinus tenderness.     Left Sinus: No maxillary sinus tenderness or frontal sinus tenderness.     Mouth/Throat:     Lips: Pink. No lesions.     Mouth: Mucous membranes are moist. No oral lesions.     Pharynx: Oropharynx is clear. Uvula midline. No posterior oropharyngeal erythema or uvula swelling.     Tonsils: No tonsillar exudate. 0 on the right. 0 on the left.  Eyes:     General: Lids are normal.        Right eye: No discharge.        Left eye: No discharge.     Extraocular Movements: Extraocular movements intact.     Conjunctiva/sclera: Conjunctivae normal.     Right eye: Right conjunctiva is not injected.     Left eye: Left conjunctiva is not injected.  Neck:     Trachea: Trachea and phonation normal.  Cardiovascular:     Rate and Rhythm: Normal rate and regular rhythm.     Pulses: Normal pulses.     Heart sounds: Normal heart sounds. No murmur heard.    No friction rub. No gallop.  Pulmonary:     Effort: Pulmonary effort is normal. No accessory muscle usage, prolonged expiration or respiratory distress.     Breath sounds: No stridor, decreased air movement or transmitted upper airway sounds. Examination of the right-middle field reveals wheezing and rales. Examination of the left-middle field reveals wheezing. Examination of the right-lower field reveals wheezing and rales. Examination of the left-lower field reveals wheezing. Wheezing and rales present. No decreased breath sounds or rhonchi.  Chest:     Chest wall: No tenderness.  Musculoskeletal:        General: Normal range of motion.     Cervical back: Normal range of motion and neck supple. Normal range of motion.  Lymphadenopathy:     Cervical: No cervical adenopathy.  Skin:    General: Skin is warm and dry.     Findings: No erythema or rash.  Neurological:     General: No focal deficit present.      Mental Status: She is alert and oriented to person, place, and time.  Psychiatric:        Mood and Affect: Mood normal.        Behavior: Behavior normal. Behavior is cooperative.     Visual Acuity Right Eye Distance:   Left Eye Distance:   Bilateral Distance:    Right Eye Near:   Left Eye Near:    Bilateral Near:     UC Couse / Diagnostics / Procedures:     Radiology No results found.  Procedures Procedures (including critical care time) EKG  Pending results:  Labs Reviewed  POC COVID19/FLU A&B COMBO  POC RSV    Medications Ordered in UC: Medications  ipratropium-albuterol (DUONEB) 0.5-2.5 (3) MG/3ML nebulizer solution 3 mL (3 mLs Nebulization Given 05/23/23 1528)    UC Diagnoses / Final Clinical Impressions(s)   I have reviewed the triage vital signs and the nursing notes.  Pertinent labs & imaging results that were available during  my care of the patient were reviewed by me and considered in my medical decision making (see chart for details).    Final diagnoses:  Productive cough  COPD exacerbation (HCC)  Wheezing  Hypoxia   Patient did not have meaningful response to DuoNeb treatment, x-ray is concerning for lower respiratory tract infection.  Wheezing appreciated on physical exam.  SPO2 is 93% on 3 L of O2 via nasal cannula.  Will discharge patient via [facility] to emergency room for further evaluation and treatment.  Please see discharge instructions below for details of plan of care as provided to patient. ED Prescriptions   None    PDMP not reviewed this encounter.  Pending results:  Labs Reviewed  POC COVID19/FLU A&B COMBO  POC RSV    Discharge Instructions   None     Disposition Upon Discharge:  Condition: stable for discharge home  Patient presented with an acute illness with associated systemic symptoms and significant discomfort requiring urgent management. In my opinion, this is a condition that a prudent lay person (someone who  possesses an average knowledge of health and medicine) may potentially expect to result in complications if not addressed urgently such as respiratory distress, impairment of bodily function or dysfunction of bodily organs.   Routine symptom specific, illness specific and/or disease specific instructions were discussed with the patient and/or caregiver at length.   As such, the patient has been evaluated and assessed, work-up was performed and treatment was provided in alignment with urgent care protocols and evidence based medicine.  Patient/parent/caregiver has been advised that the patient may require follow up for further testing and treatment if the symptoms continue in spite of treatment, as clinically indicated and appropriate.  Patient/parent/caregiver has been advised to return to the [facility] or PCP if no better; to PCP or the Emergency Department if new signs and symptoms develop, or if the current signs or symptoms continue to change or worsen for further workup, evaluation and treatment as clinically indicated and appropriate  The patient will follow up with their current PCP if and as advised. If the patient does not currently have a PCP we will assist them in obtaining one.   The patient may need specialty follow up if the symptoms continue, in spite of conservative treatment and management, for further workup, evaluation, consultation and treatment as clinically indicated and appropriate.  Patient/parent/caregiver verbalized understanding and agreement of plan as discussed.  All questions were addressed during visit.  Please see discharge instructions below for further details of plan.  This office note has been dictated using Teaching laboratory technician.  Unfortunately, this method of dictation can sometimes lead to typographical or grammatical errors.  I apologize for your inconvenience in advance if this occurs.  Please do not hesitate to reach out to me if clarification is needed.       Eloise Hake Scales, PA-C 05/23/23 1624

## 2023-05-23 NOTE — ED Notes (Signed)
 Called charge nurse, call went to voice mail

## 2023-05-23 NOTE — ED Notes (Signed)
Spoke to [facility] 

## 2023-05-23 NOTE — ED Triage Notes (Signed)
 Patient here today with c/o productive cough, ST, chest and back pain when coughing, SOB, wheeze X 1 day. Patient uses an inhaler and a nebulizer last night with some relief.

## 2023-05-23 NOTE — Progress Notes (Signed)
 RT X2 unable to obtain ABG. MD notified

## 2023-05-23 NOTE — ED Provider Notes (Addendum)
 [facility] Provider Note   CSN: [id number] Arrival date & time: 05/23/23  1700     History  Chief Complaint  Patient presents with   Shortness of Breath    [name] is a 73 y.o. female.  Patient is a 73 year old who presents with cough and shortness of breath.  She has a history of COPD.  She reports she has been feeling bad for the last 4 days.  She came home from church on Sunday and started coughing and feeling bad.  She has not had any known fevers.  No nasal congestion.  She has been coughing up some yellowish sputum.  She has some pain in her chest which she says is worse with coughing.  She denies any vomiting.  She was seen in urgent care.  Her oxygen saturation was 91% and she was having a lot of trouble breathing.  She was given 1 DuoNeb there and sent here for further evaluation.  She denies any leg swelling.       Home Medications Prior to Admission medications   Medication Sig Start Date End Date Taking? Authorizing Provider  acetaminophen (TYLENOL) 650 MG CR tablet Take 650 mg by mouth every 8 (eight) hours as needed for pain.    [provider]  albuterol (PROVENTIL) (2.5 MG/3ML) 0.083% nebulizer solution Take 3 mLs (2.5 mg total) by nebulization every 6 (six) hours as needed for wheezing or shortness of breath. 08/02/21   Margaretann Loveless, PA-C  albuterol (VENTOLIN HFA) 108 (90 Base) MCG/ACT inhaler Inhale 1-2 puffs into the lungs every 6 (six) hours as needed for wheezing or shortness of breath. 09/14/21   Anders Simmonds, PA-C  allopurinol (ZYLOPRIM) 100 MG tablet Take 100 mg by mouth daily.    [provider]  amLODipine (NORVASC) 5 MG tablet Take 5 mg by mouth daily.    [provider]  anastrozole (ARIMIDEX) 1 MG tablet TAKE ONE TABLET BY MOUTH ONCE DAILY 06/18/22   Serena Croissant, MD  aspirin 81 MG EC tablet Take 1 tablet (81 mg total) by mouth daily. 10/13/18   Pokhrel, Rebekah Chesterfield, MD   atorvastatin (LIPITOR) 20 MG tablet Take 20 mg by mouth daily.    [provider]  benzonatate (TESSALON) 100 MG capsule Take 1 capsule (100 mg total) by mouth 3 (three) times daily as needed. 08/02/21   Margaretann Loveless, PA-C  Blood Pressure Monitor DEVI Please provide patient with insurance approved blood pressure monitor. I10.0 11/15/20   Claiborne Rigg, NP  busPIRone (BUSPAR) 10 MG tablet Take 10 mg by mouth 2 (two) times daily.    [provider]  diclofenac Sodium (VOLTAREN) 1 % GEL Apply 1 Application topically 4 (four) times daily. Patient taking differently: Apply 1 Application topically in the morning and at bedtime. 09/13/21   Serena Croissant, MD  DULoxetine (CYMBALTA) 30 MG capsule Take 30 mg by mouth daily.    [provider]  ferrous sulfate 324 MG TBEC Take 324 mg by mouth daily.    [provider]  fluticasone (FLONASE) 50 MCG/ACT nasal spray Place 2 sprays into both nostrils daily. 05/10/21   Hoy Register, MD  fluticasone furoate-vilanterol (BREO ELLIPTA) 200-25 MCG/ACT AEPB Inhale 1 puff into the lungs daily. Must have office visit for refills 03/28/22   Hoy Register, MD  gabapentin (NEURONTIN) 100 MG capsule Take 100-200 mg by mouth See admin instructions. Take one tablet by mouth in the morning  and afternoon, then take 2 tablets every night per patient 03/12/22   [provider]  Insulin Pen Needle (EASY TOUCH PEN NEEDLES) 31G X 8 MM MISC Use to inject insulin once daily and Victoza once daily. Max of 2 pen needles a day. Must have office visit for refills 03/28/22   Newlin, Enobong, MD  losartan (COZAAR) 50 MG tablet TAKE ONE TABLET BY MOUTH DAILY. STOP 25mg  DOSE 08/22/21   Fleming, Zelda W, NP  methocarbamol (ROBAXIN) 500 MG tablet Take 1 tablet (500 mg total) by mouth 2 (two) times daily. 03/09/23   Prosperi, Christian H, PA-C  metoprolol succinate (TOPROL-XL) 50 MG 24 hr tablet Take 1 tablet (50 mg total) by mouth daily. Must have  office visit for refills 03/28/22   Joaquin Mulberry, MD  Misc. Devices MISC Please provide patient with nebulizer mask and tubing. [id number] 12/14/19   Fleming, Zelda W, NP  nitroGLYCERIN (NITROSTAT) 0.4 MG SL tablet Place 1 tablet (0.4 mg total) under the tongue every 5 (five) minutes as needed for chest pain. 03/15/20   Fleming, Zelda W, NP  ondansetron (ZOFRAN-ODT) 4 MG disintegrating tablet Take 4 mg by mouth every 8 (eight) hours as needed for nausea or vomiting.    [provider]  pantoprazole (PROTONIX) 40 MG tablet TAKE ONE TABLET BY MOUTH EVERY DAY FOR ACID REFLUX 11/02/21   Fleming, Zelda W, NP  predniSONE (DELTASONE) 20 MG tablet Take 2 tablets (40 mg total) by mouth daily. 03/09/23   Prosperi, Lynder Sanger H, PA-C  Respiratory Therapy Supplies (NEBULIZER MASK ADULT) MISC 1 Units by Does not apply route daily as needed. ICD 10 J44.9 05/08/21   Fleming, Zelda W, NP  SYMBICORT 160-4.5 MCG/ACT inhaler Inhale 2 puffs into the lungs in the morning and at bedtime. 02/28/22   [provider]      Allergies    Patient has no known allergies.    Review of Systems   Review of Systems  Constitutional:  Positive for fatigue. Negative for chills, diaphoresis and fever.  HENT:  Negative for congestion, rhinorrhea and sneezing.   Eyes: Negative.   Respiratory:  Positive for cough and shortness of breath. Negative for chest tightness.   Cardiovascular:  Positive for chest pain (Worse with coughing). Negative for leg swelling.  Gastrointestinal:  Negative for abdominal pain, blood in stool, diarrhea, nausea and vomiting.  Genitourinary:  Negative for difficulty urinating, flank pain, frequency and hematuria.  Musculoskeletal:  Negative for arthralgias and back pain.  Skin:  Negative for rash.  Neurological:  Negative for dizziness, speech difficulty, weakness, numbness and headaches.    Physical Exam Updated Vital Signs BP 100/76   Pulse (!) 104   Temp 98.7 F (37.1 C) (Oral)    Resp (!) 23   Ht 4\' 11"  (1.499 m)   Wt 63 kg   LMP  (LMP Unknown) Comment: tubal ligation  SpO2 97%   BMI 28.05 kg/m  Physical Exam Constitutional:      Appearance: She is well-developed. She is ill-appearing.  HENT:     Head: Normocephalic and atraumatic.  Eyes:     Pupils: Pupils are equal, round, and reactive to light.  Cardiovascular:     Rate and Rhythm: Normal rate and regular rhythm.     Heart sounds: Normal heart sounds.  Pulmonary:     Effort: Tachypnea and accessory muscle usage present. No respiratory distress.     Breath sounds: Decreased breath sounds and wheezing present. No rales.  Chest:  Chest wall: No tenderness.  Abdominal:     General: Bowel sounds are normal.     Palpations: Abdomen is soft.     Tenderness: There is no abdominal tenderness. There is no guarding or rebound.  Musculoskeletal:        General: Normal range of motion.     Cervical back: Normal range of motion and neck supple.     Comments: No edema or calf tenderness  Lymphadenopathy:     Cervical: No cervical adenopathy.  Skin:    General: Skin is warm and dry.     Findings: No rash.  Neurological:     Mental Status: She is alert and oriented to person, place, and time.     ED Results / Procedures / Treatments   Labs (all labs ordered are listed, but only abnormal results are displayed) Labs Reviewed  BASIC METABOLIC PANEL WITH GFR - Abnormal; Notable for the following components:      Result Value   Glucose, Bld 185 (*)    BUN 27 (*)    Creatinine, Ser 2.39 (*)    GFR, Estimated 21 (*)    All other components within normal limits  CBC WITH DIFFERENTIAL/PLATELET - Abnormal; Notable for the following components:   Hemoglobin 11.3 (*)    All other components within normal limits  BRAIN NATRIURETIC PEPTIDE  I-STAT ARTERIAL BLOOD GAS, ED  TROPONIN I (HIGH SENSITIVITY)  TROPONIN I (HIGH SENSITIVITY)    EKG EKG Interpretation Date/Time:  Thursday May 23 2023 19:47:54  EDT Ventricular Rate:  104 PR Interval:  134 QRS Duration:  87 QT Interval:  334 QTC Calculation: 440 R Axis:   -38  Text Interpretation: Sinus tachycardia Multiple ventricular premature complexes Inferior infarct, old Anterior infarct, old Lateral leads are also involved Confirmed by Rolan Bucco [id number]) on 05/23/2023 8:38:28 PM  Radiology DG Chest Port 1 View Result Date: 05/23/2023 CLINICAL DATA:  Shortness of breath and cough. EXAM: PORTABLE CHEST 1 VIEW COMPARISON:  Chest x-ray 05/23/2023. FINDINGS: The heart size and mediastinal contours are within normal limits. There is a small right pleural effusion. There are minimal patchy opacities in the lung bases. There is no pneumothorax. Left shoulder arthroplasty is present. No acute fractures are seen. IMPRESSION: 1. Small right pleural effusion. 2. Minimal patchy opacities in the lung bases, likely atelectasis. Electronically Signed   By: Darliss Cheney M.D.   On: 05/23/2023 19:47   DG Chest 2 View Result Date: 05/23/2023 CLINICAL DATA:  Productive cough EXAM: CHEST - 2 VIEW COMPARISON:  X-ray 03/09/2023 and older FINDINGS: No pneumothorax, effusion or edema. Normal cardiopericardial silhouette with calcified aorta. Mild left lung base opacity. Left lower lobe infiltrates possible. Recommend follow-up. Left shoulder reverse arthroplasty. Degenerative changes along the spine. Dense right upper lung nodule, likely calcified. IMPRESSION: Mild left lung base opacity. Subtle infiltrates possible. Recommend follow-up. Electronically Signed   By: Karen Kays M.D.   On: 05/23/2023 17:40    Procedures Procedures    Medications Ordered in ED Medications  azithromycin (ZITHROMAX) 500 mg in sodium chloride 0.9 % 250 mL IVPB (has no administration in time range)  sodium chloride 0.9 % bolus 500 mL (has no administration in time range)  albuterol (PROVENTIL) (2.5 MG/3ML) 0.083% nebulizer solution 5 mg (5 mg Nebulization Given 05/23/23 1924)   methylPREDNISolone sodium succinate (SOLU-MEDROL) 125 mg/2 mL injection 125 mg (125 mg Intravenous Given 05/23/23 1929)  magnesium sulfate IVPB 2 g 50 mL (0 g Intravenous Stopped 05/23/23 2033)  cefTRIAXone (ROCEPHIN) 1 g in sodium chloride 0.9 % 100 mL IVPB (0 g Intravenous Stopped 05/23/23 2110)  albuterol (PROVENTIL) (2.5 MG/3ML) 0.083% nebulizer solution 5 mg (5 mg Nebulization Given 05/23/23 2033)    ED Course/ Medical Decision Making/ A&P                                 Medical Decision Making Risk Prescription drug management. Decision regarding hospitalization.   Patient is a 73 year old who presents with cough, wheezing and shortness of breath.  She has diffuse wheezing and diminished breath sounds bilaterally with some increased work of breathing.  She was started on nebulizer treatments she received 1 by urgent care and is received 2 more here in the ED.  She has also gotten Solu-Medrol and magnesium.  She is maintaining normal oxygen saturations on nasal cannula 2 L/min.  She does not wear home oxygen.  She does not have any suggestions of pulmonary edema/fluid overload.  Chest x-ray was performed which shows slight infiltrate in the left lung base.  She was started on Rocephin and Zithromax lateral leg swelling.  Other labs are nonconcerning.  She has had some improvement in symptoms but is still tachypneic with accessory muscle use.  Chiquita Councilman who will admit the patient for.  The treatment.  21:30 patient with some worsening shortness of breath.  She is able to talk in short sentences but has not had much improvement with the nebs.  Will start BiPAP and continue nebs.  CRITICAL CARE Performed by: Hershel Los Total critical care time: 70 minutes Critical care time was exclusive of separately billable procedures and treating other patients. Critical care was necessary to treat or prevent imminent or life-threatening deterioration. Critical care was time spent personally by me on  the following activities: development of treatment plan with patient and/or surrogate as well as nursing, discussions with consultants, evaluation of patient's response to treatment, examination of patient, obtaining history from patient or surrogate, ordering and performing treatments and interventions, ordering and review of laboratory studies, ordering and review of radiographic studies, pulse oximetry and re-evaluation of patient's condition.   Final Clinical Impression(s) / ED Diagnoses Final diagnoses:  COPD exacerbation (HCC)  Community acquired pneumonia of left lower lobe of lung    Rx / DC Orders ED Discharge Orders     None         Hershel Los, MD 05/23/23 2113    Hershel Los, MD 05/23/23 2150

## 2023-05-23 NOTE — Progress Notes (Signed)
 Rt attempted to place pt. On bipap and pt. Did not tolerate. Will try again at a later time. MD notified

## 2023-05-24 ENCOUNTER — Encounter (HOSPITAL_COMMUNITY): Payer: Self-pay | Admitting: Family Medicine

## 2023-05-24 ENCOUNTER — Emergency Department (HOSPITAL_COMMUNITY)

## 2023-05-24 DIAGNOSIS — E785 Hyperlipidemia, unspecified: Secondary | ICD-10-CM | POA: Diagnosis present

## 2023-05-24 DIAGNOSIS — E861 Hypovolemia: Secondary | ICD-10-CM

## 2023-05-24 DIAGNOSIS — J189 Pneumonia, unspecified organism: Secondary | ICD-10-CM | POA: Diagnosis present

## 2023-05-24 DIAGNOSIS — I129 Hypertensive chronic kidney disease with stage 1 through stage 4 chronic kidney disease, or unspecified chronic kidney disease: Secondary | ICD-10-CM | POA: Diagnosis present

## 2023-05-24 DIAGNOSIS — Z8249 Family history of ischemic heart disease and other diseases of the circulatory system: Secondary | ICD-10-CM | POA: Diagnosis not present

## 2023-05-24 DIAGNOSIS — J441 Chronic obstructive pulmonary disease with (acute) exacerbation: Secondary | ICD-10-CM | POA: Diagnosis not present

## 2023-05-24 DIAGNOSIS — Z87891 Personal history of nicotine dependence: Secondary | ICD-10-CM | POA: Diagnosis not present

## 2023-05-24 DIAGNOSIS — E1165 Type 2 diabetes mellitus with hyperglycemia: Secondary | ICD-10-CM | POA: Diagnosis present

## 2023-05-24 DIAGNOSIS — I959 Hypotension, unspecified: Secondary | ICD-10-CM | POA: Diagnosis present

## 2023-05-24 DIAGNOSIS — K219 Gastro-esophageal reflux disease without esophagitis: Secondary | ICD-10-CM | POA: Diagnosis present

## 2023-05-24 DIAGNOSIS — Z794 Long term (current) use of insulin: Secondary | ICD-10-CM | POA: Diagnosis not present

## 2023-05-24 DIAGNOSIS — D509 Iron deficiency anemia, unspecified: Secondary | ICD-10-CM

## 2023-05-24 DIAGNOSIS — N179 Acute kidney failure, unspecified: Secondary | ICD-10-CM | POA: Diagnosis present

## 2023-05-24 DIAGNOSIS — J9621 Acute and chronic respiratory failure with hypoxia: Secondary | ICD-10-CM | POA: Diagnosis present

## 2023-05-24 DIAGNOSIS — Z7951 Long term (current) use of inhaled steroids: Secondary | ICD-10-CM | POA: Diagnosis not present

## 2023-05-24 DIAGNOSIS — R0602 Shortness of breath: Secondary | ICD-10-CM | POA: Diagnosis present

## 2023-05-24 DIAGNOSIS — R0789 Other chest pain: Secondary | ICD-10-CM | POA: Diagnosis not present

## 2023-05-24 DIAGNOSIS — E1122 Type 2 diabetes mellitus with diabetic chronic kidney disease: Secondary | ICD-10-CM | POA: Diagnosis present

## 2023-05-24 DIAGNOSIS — J9601 Acute respiratory failure with hypoxia: Secondary | ICD-10-CM

## 2023-05-24 DIAGNOSIS — F39 Unspecified mood [affective] disorder: Secondary | ICD-10-CM | POA: Diagnosis present

## 2023-05-24 DIAGNOSIS — K760 Fatty (change of) liver, not elsewhere classified: Secondary | ICD-10-CM | POA: Diagnosis present

## 2023-05-24 DIAGNOSIS — E872 Acidosis, unspecified: Secondary | ICD-10-CM | POA: Diagnosis present

## 2023-05-24 DIAGNOSIS — Z79811 Long term (current) use of aromatase inhibitors: Secondary | ICD-10-CM | POA: Diagnosis not present

## 2023-05-24 DIAGNOSIS — D631 Anemia in chronic kidney disease: Secondary | ICD-10-CM | POA: Diagnosis present

## 2023-05-24 DIAGNOSIS — N184 Chronic kidney disease, stage 4 (severe): Secondary | ICD-10-CM | POA: Diagnosis present

## 2023-05-24 DIAGNOSIS — E1140 Type 2 diabetes mellitus with diabetic neuropathy, unspecified: Secondary | ICD-10-CM | POA: Diagnosis present

## 2023-05-24 DIAGNOSIS — I2510 Atherosclerotic heart disease of native coronary artery without angina pectoris: Secondary | ICD-10-CM

## 2023-05-24 DIAGNOSIS — N1832 Chronic kidney disease, stage 3b: Secondary | ICD-10-CM | POA: Diagnosis not present

## 2023-05-24 DIAGNOSIS — A419 Sepsis, unspecified organism: Secondary | ICD-10-CM | POA: Diagnosis not present

## 2023-05-24 DIAGNOSIS — Z833 Family history of diabetes mellitus: Secondary | ICD-10-CM | POA: Diagnosis not present

## 2023-05-24 DIAGNOSIS — N189 Chronic kidney disease, unspecified: Secondary | ICD-10-CM | POA: Diagnosis not present

## 2023-05-24 DIAGNOSIS — M109 Gout, unspecified: Secondary | ICD-10-CM | POA: Diagnosis present

## 2023-05-24 DIAGNOSIS — E118 Type 2 diabetes mellitus with unspecified complications: Secondary | ICD-10-CM

## 2023-05-24 DIAGNOSIS — R6521 Severe sepsis with septic shock: Secondary | ICD-10-CM | POA: Diagnosis not present

## 2023-05-24 LAB — RESPIRATORY PANEL BY PCR

## 2023-05-24 LAB — CBC
HCT: 30.7 % — ABNORMAL LOW (ref 36.0–46.0)
Hemoglobin: 9.4 g/dL — ABNORMAL LOW (ref 12.0–15.0)
MCH: 28.7 pg (ref 26.0–34.0)
MCHC: 30.6 g/dL (ref 30.0–36.0)
MCV: 93.9 fL (ref 80.0–100.0)
Platelets: 258 10*3/uL (ref 150–400)
RBC: 3.27 MIL/uL — ABNORMAL LOW (ref 3.87–5.11)
RDW: 14.6 % (ref 11.5–15.5)
WBC: 14.4 10*3/uL — ABNORMAL HIGH (ref 4.0–10.5)
nRBC: 0 % (ref 0.0–0.2)

## 2023-05-24 LAB — COMPREHENSIVE METABOLIC PANEL WITH GFR
ALT: 10 U/L (ref 0–44)
AST: 21 U/L (ref 15–41)
Albumin: 3.2 g/dL — ABNORMAL LOW (ref 3.5–5.0)
Alkaline Phosphatase: 118 U/L (ref 38–126)
Anion gap: 18 — ABNORMAL HIGH (ref 5–15)
BUN: 31 mg/dL — ABNORMAL HIGH (ref 8–23)
CO2: 16 mmol/L — ABNORMAL LOW (ref 22–32)
Calcium: 8.9 mg/dL (ref 8.9–10.3)
Chloride: 101 mmol/L (ref 98–111)
Creatinine, Ser: 2.7 mg/dL — ABNORMAL HIGH (ref 0.44–1.00)
GFR, Estimated: 18 mL/min — ABNORMAL LOW (ref >60)
Glucose, Bld: 615 mg/dL (ref 70–99)
Potassium: 4.1 mmol/L (ref 3.5–5.1)
Sodium: 135 mmol/L (ref 135–145)
Total Bilirubin: 0.5 mg/dL (ref 0.0–1.2)
Total Protein: 7.6 g/dL (ref 6.5–8.1)

## 2023-05-24 LAB — I-STAT CG4 LACTIC ACID, ED: Lactic Acid, Venous: 7.8 mmol/L (ref 0.5–1.9)

## 2023-05-24 LAB — LACTIC ACID, PLASMA
Lactic Acid, Venous: 5.4 mmol/L (ref 0.5–1.9)
Lactic Acid, Venous: 4 mmol/L (ref 0.5–1.9)
Lactic Acid, Venous: 3.1 mmol/L (ref 0.5–1.9)

## 2023-05-24 LAB — CBG MONITORING, ED
Glucose-Capillary: 546 mg/dL (ref 70–99)
Glucose-Capillary: >600 mg/dL (ref 70–99)
Glucose-Capillary: 522 mg/dL (ref 70–99)
Glucose-Capillary: 526 mg/dL (ref 70–99)
Glucose-Capillary: 514 mg/dL (ref 70–99)
Glucose-Capillary: 389 mg/dL — ABNORMAL HIGH (ref 70–99)
Glucose-Capillary: 475 mg/dL — ABNORMAL HIGH (ref 70–99)
Glucose-Capillary: 412 mg/dL — ABNORMAL HIGH (ref 70–99)

## 2023-05-24 LAB — I-STAT ARTERIAL BLOOD GAS, ED
Acid-base deficit: 9 mmol/L — ABNORMAL HIGH (ref 0.0–2.0)
Bicarbonate: 17.8 mmol/L — ABNORMAL LOW (ref 20.0–28.0)
Calcium, Ion: 1.28 mmol/L (ref 1.15–1.40)
HCT: 34 % — ABNORMAL LOW (ref 36.0–46.0)
Hemoglobin: 11.6 g/dL — ABNORMAL LOW (ref 12.0–15.0)
O2 Saturation: 84 %
Patient temperature: 98.6
Potassium: 3.3 mmol/L — ABNORMAL LOW (ref 3.5–5.1)
Sodium: 135 mmol/L (ref 135–145)
TCO2: 19 mmol/L — ABNORMAL LOW (ref 22–32)
pCO2 arterial: 43.4 mmHg (ref 32–48)
pH, Arterial: 7.222 — ABNORMAL LOW (ref 7.35–7.45)
pO2, Arterial: 58 mmHg — ABNORMAL LOW (ref 83–108)

## 2023-05-24 LAB — HEMOGLOBIN A1C
Hgb A1c MFr Bld: 8.2 % — ABNORMAL HIGH (ref 4.8–5.6)
Mean Plasma Glucose: 188.64 mg/dL

## 2023-05-24 LAB — GLUCOSE, CAPILLARY
Glucose-Capillary: 373 mg/dL — ABNORMAL HIGH (ref 70–99)
Glucose-Capillary: 233 mg/dL — ABNORMAL HIGH (ref 70–99)
Glucose-Capillary: 164 mg/dL — ABNORMAL HIGH (ref 70–99)
Glucose-Capillary: 190 mg/dL — ABNORMAL HIGH (ref 70–99)
Glucose-Capillary: 177 mg/dL — ABNORMAL HIGH (ref 70–99)
Glucose-Capillary: 222 mg/dL — ABNORMAL HIGH (ref 70–99)

## 2023-05-24 LAB — BASIC METABOLIC PANEL WITH GFR
Anion gap: 15 (ref 5–15)
BUN: 30 mg/dL — ABNORMAL HIGH (ref 8–23)
CO2: 16 mmol/L — ABNORMAL LOW (ref 22–32)
Calcium: 9.3 mg/dL (ref 8.9–10.3)
Chloride: 103 mmol/L (ref 98–111)
Creatinine, Ser: 2.67 mg/dL — ABNORMAL HIGH (ref 0.44–1.00)
GFR, Estimated: 18 mL/min — ABNORMAL LOW (ref >60)
Glucose, Bld: 539 mg/dL (ref 70–99)
Potassium: 4.3 mmol/L (ref 3.5–5.1)
Sodium: 134 mmol/L — ABNORMAL LOW (ref 135–145)

## 2023-05-24 LAB — GAMMA GT: GGT: 18 U/L (ref 7–50)

## 2023-05-24 LAB — IRON AND TIBC
Iron: 19 ug/dL — ABNORMAL LOW (ref 28–170)
Saturation Ratios: 8 % — ABNORMAL LOW (ref 10.4–31.8)
TIBC: 248 ug/dL — ABNORMAL LOW (ref 250–450)
UIBC: 229 ug/dL

## 2023-05-24 LAB — SARS CORONAVIRUS 2 BY RT PCR: SARS Coronavirus 2 by RT PCR: NEGATIVE

## 2023-05-24 LAB — BETA-HYDROXYBUTYRIC ACID: Beta-Hydroxybutyric Acid: 0.08 mmol/L (ref 0.05–0.27)

## 2023-05-24 LAB — PROCALCITONIN: Procalcitonin: 0.1 ng/mL

## 2023-05-24 LAB — MRSA NEXT GEN BY PCR, NASAL: MRSA by PCR Next Gen: NOT DETECTED

## 2023-05-24 LAB — FERRITIN: Ferritin: 48 ng/mL (ref 11–307)

## 2023-05-24 LAB — MAGNESIUM: Magnesium: 2.1 mg/dL (ref 1.7–2.4)

## 2023-05-24 MED ORDER — IPRATROPIUM-ALBUTEROL 0.5-2.5 (3) MG/3ML IN SOLN
3.0000 mL | RESPIRATORY_TRACT | Status: DC | PRN
  Administered 2023-05-25 – 2023-05-29 (×8): 3 mL via RESPIRATORY_TRACT
  Filled 2023-05-24 (×8): qty 3

## 2023-05-24 MED ORDER — DEXTROSE 50 % IV SOLN: 0.0000 mL | INTRAVENOUS | Status: DC | PRN

## 2023-05-24 MED ORDER — SODIUM CHLORIDE 0.9 % IV SOLN
500.0000 mg | INTRAVENOUS | Status: AC
  Administered 2023-05-24 – 2023-05-27 (×4): 500 mg via INTRAVENOUS
  Filled 2023-05-24 (×5): qty 5

## 2023-05-24 MED ORDER — SODIUM CHLORIDE 0.9 % IV SOLN
2.0000 g | INTRAVENOUS | Status: AC
  Administered 2023-05-24 – 2023-05-29 (×6): 2 g via INTRAVENOUS
  Filled 2023-05-24 (×6): qty 20

## 2023-05-24 MED ORDER — SODIUM CHLORIDE 0.9 % IV SOLN: 250.0000 mL | INTRAVENOUS | Status: AC

## 2023-05-24 MED ORDER — SENNA 8.6 MG PO TABS
1.0000 | ORAL_TABLET | Freq: Every day | ORAL | Status: DC | PRN
  Administered 2023-06-02: 8.6 mg via ORAL
  Filled 2023-05-24: qty 1

## 2023-05-24 MED ORDER — ONDANSETRON HCL 4 MG PO TABS
4.0000 mg | ORAL_TABLET | Freq: Four times a day (QID) | ORAL | Status: DC | PRN
Start: 2023-05-24 — End: 2023-06-02

## 2023-05-24 MED ORDER — METHYLPREDNISOLONE SODIUM SUCC 125 MG IJ SOLR
125.0000 mg | Freq: Every day | INTRAMUSCULAR | Status: DC
  Filled 2023-05-24: qty 2

## 2023-05-24 MED ORDER — ACETAMINOPHEN 325 MG PO TABS: 650.0000 mg | ORAL_TABLET | Freq: Four times a day (QID) | ORAL | Status: DC | PRN

## 2023-05-24 MED ORDER — IPRATROPIUM-ALBUTEROL 0.5-2.5 (3) MG/3ML IN SOLN
3.0000 mL | RESPIRATORY_TRACT | Status: DC
  Administered 2023-05-24: 3 mL via RESPIRATORY_TRACT
  Filled 2023-05-24: qty 3

## 2023-05-24 MED ORDER — PANTOPRAZOLE SODIUM 40 MG IV SOLR
40.0000 mg | INTRAVENOUS | Status: DC
Start: 2023-05-24 — End: 2023-05-27
  Administered 2023-05-24 – 2023-05-27 (×4): 40 mg via INTRAVENOUS
  Filled 2023-05-24 (×4): qty 10

## 2023-05-24 MED ORDER — METHYLPREDNISOLONE SODIUM SUCC 40 MG IJ SOLR
40.0000 mg | Freq: Every day | INTRAMUSCULAR | Status: DC
  Administered 2023-05-24: 40 mg via INTRAVENOUS
  Filled 2023-05-24: qty 1

## 2023-05-24 MED ORDER — HYDROMORPHONE HCL 1 MG/ML IJ SOLN
0.5000 mg | INTRAMUSCULAR | Status: DC | PRN
  Administered 2023-05-24 – 2023-05-27 (×3): 0.5 mg via INTRAVENOUS
  Filled 2023-05-24 (×3): qty 0.5

## 2023-05-24 MED ORDER — LACTATED RINGERS IV BOLUS
1000.0000 mL | Freq: Once | INTRAVENOUS | Status: AC
  Administered 2023-05-24: 1000 mL via INTRAVENOUS

## 2023-05-24 MED ORDER — HEPARIN SODIUM (PORCINE) 5000 UNIT/ML IJ SOLN
5000.0000 [IU] | Freq: Three times a day (TID) | INTRAMUSCULAR | Status: DC
  Administered 2023-05-24 – 2023-06-02 (×28): 5000 [IU] via SUBCUTANEOUS
  Filled 2023-05-24 (×29): qty 1

## 2023-05-24 MED ORDER — METHOCARBAMOL 500 MG PO TABS
500.0000 mg | ORAL_TABLET | Freq: Two times a day (BID) | ORAL | Status: DC | PRN
  Administered 2023-05-25 – 2023-06-02 (×4): 500 mg via ORAL
  Filled 2023-05-24 (×4): qty 1

## 2023-05-24 MED ORDER — GABAPENTIN 100 MG PO CAPS
100.0000 mg | ORAL_CAPSULE | Freq: Two times a day (BID) | ORAL | Status: DC
Start: 2023-05-24 — End: 2023-06-02
  Administered 2023-05-25 – 2023-06-02 (×18): 100 mg via ORAL
  Filled 2023-05-24 (×18): qty 1

## 2023-05-24 MED ORDER — REVEFENACIN 175 MCG/3ML IN SOLN
175.0000 ug | Freq: Every day | RESPIRATORY_TRACT | Status: DC
  Administered 2023-05-24 – 2023-05-31 (×8): 175 ug via RESPIRATORY_TRACT
  Filled 2023-05-24 (×8): qty 3

## 2023-05-24 MED ORDER — ANASTROZOLE 1 MG PO TABS
1.0000 mg | ORAL_TABLET | Freq: Every day | ORAL | Status: DC
  Administered 2023-05-25 – 2023-06-02 (×9): 1 mg via ORAL
  Filled 2023-05-24 (×10): qty 1

## 2023-05-24 MED ORDER — SODIUM CHLORIDE 0.9 % IV BOLUS
500.0000 mL | Freq: Once | INTRAVENOUS | Status: AC
  Administered 2023-05-24: 500 mL via INTRAVENOUS

## 2023-05-24 MED ORDER — ARFORMOTEROL TARTRATE 15 MCG/2ML IN NEBU
15.0000 ug | INHALATION_SOLUTION | Freq: Two times a day (BID) | RESPIRATORY_TRACT | Status: DC
  Administered 2023-05-24 – 2023-05-31 (×15): 15 ug via RESPIRATORY_TRACT
  Filled 2023-05-24 (×15): qty 2

## 2023-05-24 MED ORDER — HYDROXYZINE HCL 25 MG PO TABS
50.0000 mg | ORAL_TABLET | Freq: Every day | ORAL | Status: DC
  Administered 2023-05-26 – 2023-06-01 (×7): 50 mg via ORAL
  Filled 2023-05-24 (×7): qty 2

## 2023-05-24 MED ORDER — ALLOPURINOL 100 MG PO TABS
100.0000 mg | ORAL_TABLET | Freq: Every day | ORAL | Status: DC
  Administered 2023-05-25 – 2023-06-02 (×9): 100 mg via ORAL
  Filled 2023-05-24 (×9): qty 1

## 2023-05-24 MED ORDER — ASPIRIN 81 MG PO TBEC
81.0000 mg | DELAYED_RELEASE_TABLET | Freq: Every day | ORAL | Status: DC
  Administered 2023-05-25 – 2023-06-02 (×9): 81 mg via ORAL
  Filled 2023-05-24 (×9): qty 1

## 2023-05-24 MED ORDER — INSULIN GLARGINE-YFGN 100 UNIT/ML ~~LOC~~ SOLN
10.0000 [IU] | Freq: Every day | SUBCUTANEOUS | Status: DC
  Filled 2023-05-24: qty 0.1

## 2023-05-24 MED ORDER — INSULIN GLARGINE-YFGN 100 UNIT/ML ~~LOC~~ SOLN
10.0000 [IU] | Freq: Every day | SUBCUTANEOUS | Status: DC
  Administered 2023-05-24: 10 [IU] via SUBCUTANEOUS
  Filled 2023-05-24 (×2): qty 0.1

## 2023-05-24 MED ORDER — CHLORHEXIDINE GLUCONATE CLOTH 2 % EX PADS
6.0000 | MEDICATED_PAD | Freq: Every day | CUTANEOUS | Status: DC
Start: 2023-05-24 — End: 2023-05-28
  Administered 2023-05-24 – 2023-05-27 (×4): 6 via TOPICAL

## 2023-05-24 MED ORDER — NOREPINEPHRINE 4 MG/250ML-% IV SOLN: 0.0000 ug/min | INTRAVENOUS | Status: DC

## 2023-05-24 MED ORDER — INSULIN ASPART 100 UNIT/ML IJ SOLN
4.0000 [IU] | Freq: Once | INTRAMUSCULAR | Status: AC
  Administered 2023-05-24: 4 [IU] via SUBCUTANEOUS

## 2023-05-24 MED ORDER — PANTOPRAZOLE SODIUM 40 MG IV SOLR
40.0000 mg | Freq: Once | INTRAVENOUS | Status: AC
  Administered 2023-05-24: 40 mg via INTRAVENOUS
  Filled 2023-05-24: qty 10

## 2023-05-24 MED ORDER — GABAPENTIN 100 MG PO CAPS
200.0000 mg | ORAL_CAPSULE | Freq: Every day | ORAL | Status: DC
  Administered 2023-05-26 – 2023-06-01 (×7): 200 mg via ORAL
  Filled 2023-05-24 (×7): qty 2

## 2023-05-24 MED ORDER — PANTOPRAZOLE SODIUM 40 MG PO TBEC: 40.0000 mg | DELAYED_RELEASE_TABLET | Freq: Every day | ORAL | Status: DC

## 2023-05-24 MED ORDER — INSULIN REGULAR(HUMAN) IN NACL 100-0.9 UT/100ML-% IV SOLN
INTRAVENOUS | Status: DC
  Administered 2023-05-24: 10 [IU]/h via INTRAVENOUS
  Administered 2023-05-24: 7 [IU]/h via INTRAVENOUS
  Filled 2023-05-24 (×2): qty 100

## 2023-05-24 MED ORDER — SODIUM CHLORIDE 0.9% FLUSH
3.0000 mL | Freq: Two times a day (BID) | INTRAVENOUS | Status: DC
  Administered 2023-05-24 – 2023-06-02 (×16): 3 mL via INTRAVENOUS

## 2023-05-24 MED ORDER — DULOXETINE HCL 30 MG PO CPEP
30.0000 mg | ORAL_CAPSULE | Freq: Every day | ORAL | Status: DC
  Administered 2023-05-25 – 2023-06-02 (×9): 30 mg via ORAL
  Filled 2023-05-24 (×9): qty 1

## 2023-05-24 MED ORDER — SODIUM CHLORIDE 0.9 % IV SOLN: INTRAVENOUS | Status: AC

## 2023-05-24 MED ORDER — ONDANSETRON HCL 4 MG/2ML IJ SOLN: 4.0000 mg | Freq: Four times a day (QID) | INTRAMUSCULAR | Status: DC | PRN

## 2023-05-24 MED ORDER — ACETAMINOPHEN 650 MG RE SUPP: 650.0000 mg | Freq: Four times a day (QID) | RECTAL | Status: DC | PRN

## 2023-05-24 MED ORDER — NOREPINEPHRINE 4 MG/250ML-% IV SOLN
2.0000 ug/min | INTRAVENOUS | Status: DC
Start: 2023-05-24 — End: 2023-05-27
  Administered 2023-05-24 – 2023-05-25 (×2): 2 ug/min via INTRAVENOUS
  Filled 2023-05-24: qty 250

## 2023-05-24 MED ORDER — INSULIN ASPART 100 UNIT/ML IJ SOLN
0.0000 [IU] | INTRAMUSCULAR | Status: DC
  Administered 2023-05-24: 6 [IU] via SUBCUTANEOUS

## 2023-05-24 MED ORDER — GABAPENTIN 100 MG PO CAPS
100.0000 mg | ORAL_CAPSULE | ORAL | Status: DC
Start: 2023-05-24 — End: 2023-05-24

## 2023-05-24 MED ORDER — ALBUMIN HUMAN 25 % IV SOLN
25.0000 g | Freq: Once | INTRAVENOUS | Status: AC
  Administered 2023-05-24: 12.5 g via INTRAVENOUS
  Filled 2023-05-24: qty 100

## 2023-05-24 MED ORDER — DEXTROSE IN LACTATED RINGERS 5 % IV SOLN: INTRAVENOUS | Status: DC

## 2023-05-24 MED ORDER — ATORVASTATIN CALCIUM 10 MG PO TABS
20.0000 mg | ORAL_TABLET | Freq: Every day | ORAL | Status: DC
  Administered 2023-05-25 – 2023-06-02 (×9): 20 mg via ORAL
  Filled 2023-05-24 (×9): qty 2

## 2023-05-24 NOTE — Consult Note (Signed)
 NAME:  [name], MRN:  [id number], DOB:  September 12, 1950, LOS: 0 ADMISSION DATE:  05/23/2023, CONSULTATION DATE:  05/24/23 REFERRING MD:  EDP, CHIEF COMPLAINT:  aecopd with hypercarbia   History of Present Illness:  73 yo female presented from urgent care with complaints of shortness of breath and cough for 4 days after she was noted to be hypoxic at urgent care. She was negative for flu/covid at clinic. When she presented to [facility] she was wheezing and given duoneb. She was complaining of chest pain when she coughed only. She denied fever/chills but endorses general fatigue and overall "not feeling well". Cough is productive with yellow sputum, oxygen  sats where noted to be 91% and she was in resp distress. She was placed on NIV with improvement in resp distress, [facility] was asked to admit.   However after some time she removed her NIV and refused replacement. CCM was consulted for evaluation. Upon my eval. Pt was on [place] 2L with sats 100% but with accessory muscle use and tachypneic. She was rather somnolent but arousable albeit confused and not able to provide history for me. I recommended intubation for pt in light of her exam and refusal for NIV and continued decompensation. Primary physician at this time was able to d/w family and they have declined intubation and agreed to have NIV placed back on.   At this time pt is stable for progressive care. However, should she or her family change their wishes and request intubation and she decompensates, requiring it again; ccm will be happy to assist and transfer pt to ICU.  Pertinent  Medical History  Copd Htn Anxiety Gout Hyperlipidemia GERD T2dm with hyperglycemia   Significant Hospital Events: Including procedures, antibiotic start and stop dates in addition to other pertinent events   Admitted to [facility] 4/18 Ccm consulted 4/18   Interim History / Subjective:    Objective   Blood pressure 90/69, pulse (!) 111, temperature 97.9 F (36.6 C),  temperature source Oral, resp. rate (!) 24, height 4\' 11"  (1.499 m), weight 63 kg, SpO2 100%.    Vent Mode: PCV;BIPAP FiO2 (%):  [40 %] 40 % Set Rate:  [15 bmp] 15 bmp  No intake or output data in the 24 hours ending 05/24/23 0307 Filed Weights   05/23/23 1826  Weight: 63 kg    Examination: General: somnolent, arousable but confused appears acutely ill HENT: ncat, mmmp, eomi, perrla Lungs: diffuse wheeze, accessory muscle use, tachypnea, shallow breathing Cardiovascular: tachycardia, regular Abdomen: obese, nt,nd bs+ Extremities: +le edema, no c/c Neuro: arousable, follows commands intermittently, tremulous, no focal deficits GU: deferred.   Resolved Hospital Problem list     Assessment & Plan:  Acute exacerbation copd Acute hypoxic resp failure Ckd3b Elevated alk phos Chronic normocytic anemia T2dm with Hyperglycemia -recommend steroid scheduled 40q8 -nebs scheduled initially q4h + prn -check mag -recommend full rvp for completeness sake -empiric abx coverage and culture expectorated sputum with ct revealing infiltrate -cont NIV while in acute exacerbation -titrate oxygen  sat >94% -ssi -check ggt for alk phos -no indication for transfusion  Htn Hyperlipidemia Gout Gerd Anxiety -per primary  Admit to progressive, please reach back out to ccm should pt not improve or worsen. Thank you    Best Practice (right click and "Reselect all SmartList Selections" daily)   Diet/type: NPO w/ oral meds DVT prophylaxis SCD Pressure ulcer(s): N/A GI prophylaxis: PPI Lines: N/A Foley:  N/A Code Status:  per primary, refusing intubation Last date of multidisciplinary goals  of care discussion [per primary]  Labs   CBC: Recent Labs  Lab 05/23/23 1728  WBC 9.9  NEUTROABS 6.2  HGB 11.3*  HCT 36.6  MCV 92.7  PLT 296    Basic Metabolic Panel: Recent Labs  Lab 05/23/23 1728  NA 137  K 3.6  CL 102  CO2 23  GLUCOSE 185*  BUN 27*  CREATININE 2.39*  CALCIUM   9.7   GFR: Estimated Creatinine Clearance: 16.4 mL/min (A) (by C-G formula based on SCr of 2.39 mg/dL (H)). Recent Labs  Lab 05/23/23 1728  WBC 9.9    Liver Function Tests: No results for input(s): "AST", "ALT", "ALKPHOS", "BILITOT", "PROT", "ALBUMIN " in the last 168 hours. No results for input(s): "LIPASE", "AMYLASE" in the last 168 hours. No results for input(s): "AMMONIA" in the last 168 hours.  ABG    Component Value Date/Time   PHART 7.343 (L) 04/01/2014 0251   PCO2ART 41.0 04/01/2014 0251   PO2ART 73.0 (L) 04/01/2014 0251   HCO3 22.4 04/01/2014 0251   TCO2 22 09/12/2017 0838   ACIDBASEDEF 3.0 (H) 04/01/2014 0251   O2SAT 94.0 04/01/2014 0251     Coagulation Profile: No results for input(s): "INR", "PROTIME" in the last 168 hours.  Cardiac Enzymes: No results for input(s): "CKTOTAL", "CKMB", "CKMBINDEX", "TROPONINI" in the last 168 hours.  HbA1C: Hgb A1c MFr Bld  Date/Time Value Ref Range Status  02/08/2021 11:26 AM 5.9 (H) 4.8 - 5.6 % Final    Comment:    (NOTE) Pre diabetes:          5.7%-6.4%  Diabetes:              >6.4%  Glycemic control for   <7.0% adults with diabetes   10/13/2020 09:04 AM 5.9 (H) 4.8 - 5.6 % Final    Comment:             Prediabetes: 5.7 - 6.4          Diabetes: >6.4          Glycemic control for adults with diabetes: <7.0     CBG: No results for input(s): "GLUCAP" in the last 168 hours.  Review of Systems:   As per HPI  Past Medical History:  She,  has a past medical history of Ambulates with cane, Angina, Arthritis, Asthma, Breast cancer (HCC), Cancer (HCC) (09/04/2017), Carpal tunnel syndrome, bilateral (11/26/2016), Cholelithiasis, Cocaine abuse (HCC) (07/2012), COPD (chronic obstructive pulmonary disease) (HCC), Coronary artery disease, Diabetes mellitus without mention of complication, Dysphagia, Fatty liver disease, nonalcoholic (2009), GERD (gastroesophageal reflux disease), Headache, Hearing loss, History of colonic  polyps (2009), Hyperlipidemia, Hypertension, Iron  deficiency anemia, Myocardial infarction (HCC), Nausea with vomiting, Ulnar neuropathy at elbow, 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   Surgical History:   Past Surgical History:  Procedure Laterality Date   ABDOMINAL ANGIOGRAM  02/20/2011   Procedure: ABDOMINAL ANGIOGRAM;  Surgeon: Sharene Dauer, MD;  Location: [facility] CATH LAB;  Service: Cardiovascular;;   BREAST BIOPSY Left    CARPAL TUNNEL RELEASE Right 07/04/2017   Procedure: RIGHT CARPAL TUNNEL RELEASE;  Surgeon: Brunilda Capra, MD;  Location: [facility];  Service: Orthopedics;  Laterality: Right;   CARPAL TUNNEL RELEASE Left 09/12/2017   Procedure: LEFT CARPAL TUNNEL RELEASE;  Surgeon: Brunilda Capra, MD;  Location: [facility];  Service: Orthopedics;  Laterality: Left;   CATARACT EXTRACTION     CHOLECYSTECTOMY N/A 11/11/2018   Procedure: ATTEMPTED LAPAROSCOPIC CHOLECUSTECOMY,  OPEN CHOLECYSTECTOMY;  Surgeon: Oza Blumenthal,  MD;  Location: [facility] OR;  Service: General;  Laterality: N/A;   COLONOSCOPY  11/30/2011   Procedure: COLONOSCOPY;  Surgeon: Pietro Bridegroom, MD;  Location: [facility] ENDOSCOPY;  Service: Endoscopy;  Laterality: N/A;   CORONARY ANGIOPLASTY WITH STENT PLACEMENT  02/20/2011   ERCP N/A 10/03/2018   Procedure: ENDOSCOPIC RETROGRADE CHOLANGIOPANCREATOGRAPHY (ERCP);  Surgeon: Asencion Blacksmith, MD;  Location: [facility];  Service: Endoscopy;  Laterality: N/A;   ESOPHAGOGASTRODUODENOSCOPY  11/30/2011   Procedure: ESOPHAGOGASTRODUODENOSCOPY (EGD);  Surgeon: Pietro Bridegroom, MD;  Location: [facility];  Service: Endoscopy;  Laterality: N/A;   ESOPHAGOGASTRODUODENOSCOPY N/A 02/23/2015   Procedure: ESOPHAGOGASTRODUODENOSCOPY (EGD);  Surgeon: Kenney Peacemaker, MD;  Location: [facility] ENDOSCOPY;  Service: Endoscopy;  Laterality: N/A;   EYE SURGERY Bilateral    cataracts removed   INTRAOPERATIVE CHOLANGIOGRAM N/A 11/11/2018   Procedure: Intraoperative  Cholangiogram;  Surgeon: Oza Blumenthal, MD;  Location: [facility];  Service: General;  Laterality: N/A;   IR REMOVAL TUN ACCESS W/ PORT W/O FL MOD SED  06/30/2020   LEFT HEART CATHETERIZATION WITH CORONARY ANGIOGRAM N/A 02/20/2011   Procedure: LEFT HEART CATHETERIZATION WITH CORONARY ANGIOGRAM;  Surgeon: Sharene Dauer, MD;  Location: [facility] CATH LAB;  Service: Cardiovascular;  Laterality: N/A;   MASTECTOMY Right 11/05/2017   MASTECTOMY W/ SENTINEL NODE BIOPSY Right 11/27/2017   MASTECTOMY W/ SENTINEL NODE BIOPSY Right 11/27/2017   Procedure: RIGHT TOTAL MASTECTOMY WITH SENTINEL LYMPH NODE BIOPSY;  Surgeon: Ayesha Lente, MD;  Location: [facility];  Service: General;  Laterality: Right;   PORT A CATH REVISION Left 05/07/2018   Procedure: PORT A CATH REVISION;  Surgeon: Ayesha Lente, MD;  Location: [facility] ORS;  Service: General;  Laterality: Left;   PORTACATH PLACEMENT Left 11/27/2017   Procedure: INSERTION PORT-A-CATH;  Surgeon: Ayesha Lente, MD;  Location: [facility];  Service: General;  Laterality: Left;   REVERSE SHOULDER ARTHROPLASTY Left 03/09/2021   Procedure: REVERSE SHOULDER ARTHROPLASTY;  Surgeon: Ellard Gunning, MD;  Location: [facility];  Service: Orthopedics;  Laterality: Left;   RIGHT COLECTOMY  02/2007   for post polypectomy colonic perforation   SPHINCTEROTOMY  10/03/2018   Procedure: SPHINCTEROTOMY;  Surgeon: Asencion Blacksmith, MD;  Location: [facility];  Service: Endoscopy;;   TUBAL LIGATION       Social History:   reports that she quit smoking about 13 years ago. Her smoking use included cigarettes. She started smoking about 60 years ago. She has a 11.8 pack-year smoking history. She has never used smokeless tobacco. She reports that she does not currently use alcohol. She reports that she does not currently use drugs after having used the following drugs: Cocaine.   Family History:  Her family history includes Cancer in her father; Diabetes in her mother and sister; Heart  disease in her mother. There is no history of Colon cancer or Breast cancer.   Allergies No Known Allergies   Home Medications  Prior to Admission medications   Medication Sig Start Date End Date Taking? Authorizing Provider  acetaminophen  (TYLENOL ) 650 MG CR tablet Take 650 mg by mouth every 8 (eight) hours as needed for pain.   Yes [provider]  albuterol  (PROVENTIL ) (2.5 MG/3ML) 0.083% nebulizer solution Take 3 mLs (2.5 mg total) by nebulization every 6 (six) hours as needed for wheezing or shortness of breath. 08/02/21  Yes Burnette, Leory Rands, PA-C  albuterol  (VENTOLIN  HFA) 108 (90 Base) MCG/ACT inhaler Inhale 1-2 puffs into the lungs every 6 (six) hours as needed for wheezing or shortness  of breath. 09/14/21  Yes Hassie Lint, PA-C  allopurinol  (ZYLOPRIM ) 100 MG tablet Take 100 mg by mouth daily.   Yes [provider]  amLODipine  (NORVASC ) 10 MG tablet Take 10 mg by mouth daily.   Yes [provider]  anastrozole  (ARIMIDEX ) 1 MG tablet TAKE ONE TABLET BY MOUTH ONCE DAILY 06/18/22  Yes Cameron Cea, MD  aspirin  81 MG EC tablet Take 1 tablet (81 mg total) by mouth daily. Patient taking differently: Take 81 mg by mouth in the morning and at bedtime. 10/13/18  Yes Pokhrel, Laxman, MD  atorvastatin  (LIPITOR) 20 MG tablet Take 20 mg by mouth daily.   Yes [provider]  DULoxetine  (CYMBALTA ) 30 MG capsule Take 30 mg by mouth daily.   Yes [provider]  fluticasone  (FLONASE ) 50 MCG/ACT nasal spray Place 2 sprays into both nostrils daily. 05/10/21  Yes Newlin, Enobong, MD  gabapentin  (NEURONTIN ) 100 MG capsule Take 100-200 mg by mouth See admin instructions. Take one tablet by mouth in the morning and afternoon, then take 2 tablets every night per patient 03/12/22  Yes [provider]  hydrOXYzine  (ATARAX ) 25 MG tablet Take 50 mg by mouth at bedtime. 05/07/23  Yes [provider]  losartan  (COZAAR ) 50 MG tablet TAKE ONE TABLET BY  MOUTH DAILY. STOP 25mg  DOSE 08/22/21  Yes Fleming, Zelda W, NP  methocarbamol  (ROBAXIN ) 500 MG tablet Take 1 tablet (500 mg total) by mouth 2 (two) times daily. 03/09/23  Yes Prosperi, Christian H, PA-C  metoprolol  succinate (TOPROL -XL) 50 MG 24 hr tablet Take 1 tablet (50 mg total) by mouth daily. Must have office visit for refills 03/28/22  Yes Newlin, Lavelle Posey, MD  nitroGLYCERIN  (NITROSTAT ) 0.4 MG SL tablet Place 1 tablet (0.4 mg total) under the tongue every 5 (five) minutes as needed for chest pain. 03/15/20  Yes Fleming, Zelda W, NP  ondansetron  (ZOFRAN -ODT) 4 MG disintegrating tablet Take 4 mg by mouth every 8 (eight) hours as needed for nausea or vomiting.   Yes [provider]  pantoprazole  (PROTONIX ) 40 MG tablet TAKE ONE TABLET BY MOUTH EVERY DAY FOR ACID REFLUX 11/02/21  Yes Collins Dean, NP  SYMBICORT  160-4.5 MCG/ACT inhaler Inhale 2 puffs into the lungs in the morning and at bedtime. 02/28/22  Yes [provider]  Blood Pressure Monitor DEVI Please provide patient with insurance approved blood pressure monitor. I10.0 11/15/20   Fleming, Zelda W, NP  diclofenac  Sodium (VOLTAREN ) 1 % GEL Apply 1 Application topically 4 (four) times daily. Patient taking differently: Apply 1 Application topically in the morning and at bedtime. 09/13/21   Gudena, Vinay, MD  Insulin  Pen Needle (EASY TOUCH PEN NEEDLES) 31G X 8 MM MISC Use to inject insulin  once daily and Victoza  once daily. Max of 2 pen needles a day. Must have office visit for refills 03/28/22   Joaquin Mulberry, MD  Misc. Devices MISC Please provide patient with nebulizer mask and tubing. [id number] 12/14/19   Collins Dean, NP  Respiratory Therapy Supplies (NEBULIZER MASK ADULT) MISC 1 Units by Does not apply route daily as needed. ICD 10 J44.9 05/08/21   Collins Dean, NP     Critical care time: 

## 2023-05-24 NOTE — ED Notes (Signed)
 Pt unable to tolerate bipap. Asking for small break. Bipap paused and pt placed on 3L [place]

## 2023-05-24 NOTE — Consult Note (Signed)
 NAME:  [name], MRN:  [id number], DOB:  04-03-1950, LOS: 0 ADMISSION DATE:  05/23/2023, CONSULTATION DATE:  05/24/23 REFERRING MD:  EDP, CHIEF COMPLAINT:  aecopd with hypercarbia   History of Present Illness:  73 yo female presented from urgent care with complaints of shortness of breath and cough for 4 days after she was noted to be hypoxic at urgent care. She was negative for flu/covid at clinic. When she presented to [facility] she was wheezing and given duoneb. She was complaining of chest pain when she coughed only. She denied fever/chills but endorses general fatigue and overall "not feeling well". Cough is productive with yellow sputum, oxygen  sats where noted to be 91% and she was in resp distress. She was placed on NIV with improvement in resp distress, [facility] was asked to admit.   However after some time she removed her NIV and refused replacement. CCM was consulted for evaluation. Upon my eval. Pt was on [place] 2L with sats 100% but with accessory muscle use and tachypneic. She was rather somnolent but arousable albeit confused and not able to provide history for me. I recommended intubation for pt in light of her exam and refusal for NIV and continued decompensation. Primary physician at this time was able to d/w family and they have declined intubation and agreed to have NIV placed back on.   At this time pt is stable for progressive care. However, should she or her family change their wishes and request intubation and she decompensates, requiring it again; ccm will be happy to assist and transfer pt to ICU.  Pertinent  Medical History  Copd Htn Anxiety Gout Hyperlipidemia GERD T2dm with hyperglycemia   Significant Hospital Events: Including procedures, antibiotic start and stop dates in addition to other pertinent events   Admitted to [facility] 4/18, Ccm consulted for hypotension  Interim History / Subjective:  Did poorly off bipap per RT--not able to complete a sentence.   Objective    Blood pressure (!) 80/62, pulse (!) 110, temperature 98.3 F (36.8 C), resp. rate (!) 25, height 4\' 11"  (1.499 m), weight 63 kg, SpO2 100%.    Vent Mode: PCV;BIPAP FiO2 (%):  [40 %] 40 % Set Rate:  [15 bmp] 15 bmp   Intake/Output Summary (Last 24 hours) at 05/24/2023 0726 Last data filed at 05/24/2023 4098 Gross per 24 hour  Intake 68.22 ml  Output --  Net 68.22 ml   Filed Weights   05/23/23 1826  Weight: 63 kg    Examination: General: ill appearing woman sitting up in bed in NAD, on bipap HENT: [place]/AT, eyes anicteric Lungs: synchronous with bipap, better with PS 8 due to less leak, getting adequate Vt. Tachypneic, no wheezing.  Cardiovascular: S1S2, RRR Abdomen: obese, soft, NT Extremities: no edema or cyanosis, no foot wounds Neuro: awake, alert, moving all extremities, comprehension appears intact.  LA 7.8  Multiple BG >500 Bicarb 16 BUN 31 Cr 2.7 PCT 0.1 WBC 14.4 H/H 9.4/30.7 Platelets 258   Resolved Hospital Problem list     Assessment & Plan:  Acute exacerbation COPD Acute hypoxic respiratory failure -Con't BiPAP for now; can try again later today for bipap break. Looks ok on BiPAP. -address metabolic acidosis to take strain off pulmonary system -steroids -bronchodilators- yupelri , brovana , PRN duonebs  -empiric antibiotics for CAP- azithro, ceftriaxone  (QT ok) -pulmonary hygiene -strep pneumonia and legionella antigens ordered -RVP pending  Lactic acidosis-- not sure if due to sepsis or significant pulmonary muscle use & respiratory failure  at presentation -waiting on recheck; d/w RN in ED -additional IVF  Septic shock due to pneumonia -additional 1L LR -con't vasopressors to maintain MAP >65 -anticipate she will need central access  AKI on CKD 4 -renally dose meds, avoid nephrotoxic med -strict I/o -volume resuscitation  Chronic normocytic anemia; appears to have had IDA in 2020 -repeat iron  studies -transfuse for Hb <7 or hemodynamically  significant bleeding  Uncontrolled hyperglycemia, uncontrolled DM; A1c 8.2 -start insulin  gtt -check B-OH-butyrate -goal BG 140-180  HTN -hold PTA amlodipine , losartan ,   Hyperlipidemia -resume PTA statin once able to take PO  Gout -con't allopurinol  once able to take PO  H/o GERD -con't PTA PPI; IV until able to take PO, esp with steroids  Best Practice (right click and "Reselect all SmartList Selections" daily)   Diet/type: NPO w/ oral meds DVT prophylaxis prophylactic heparin   Pressure ulcer(s): N/A GI prophylaxis: PPI Lines: N/A Foley:  N/A Code Status:  per primary, refusing intubation > need to clarify this further, but she does not require it currently Last date of multidisciplinary goals of care discussion [per primary]  Labs   CBC: Recent Labs  Lab 05/23/23 1728 05/24/23 0151  WBC 9.9  --   NEUTROABS 6.2  --   HGB 11.3* 11.6*  HCT 36.6 34.0*  MCV 92.7  --   PLT 296  --     Basic Metabolic Panel: Recent Labs  Lab 05/23/23 1728 05/24/23 0151  NA 137 135  K 3.6 3.3*  CL 102  --   CO2 23  --   GLUCOSE 185*  --   BUN 27*  --   CREATININE 2.39*  --   CALCIUM  9.7  --    GFR: Estimated Creatinine Clearance: 16.4 mL/min (A) (by C-G formula based on SCr of 2.39 mg/dL (H)). Recent Labs  Lab 05/23/23 1728 05/24/23 0435  WBC 9.9  --   LATICACIDVEN  --  7.8*      Critical care time:  40 min.     Joesph Mussel, DO 05/24/23 9:38 AM [facility]  For contact information, see Amion. If no response to pager, please call PCCM consult pager. After hours, 7PM- 7AM, please call Elink.

## 2023-05-24 NOTE — Progress Notes (Addendum)
 eLink Physician-Brief Progress Note Patient Name: [name] DOB: 1950-08-10 MRN: [id number]   Date of Service  05/24/2023  HPI/Events of Note  73 year old female that initially presented with shortness of breath and cough for 4 days currently on noninvasive ventilation.  Reports that she is having 5 out of 10 radiating chest pain to her back.  No recent oral intake.  No disparity in blood pressure readings.  No aneurysm noted on CT chest without contrast.  EKG reviewed and within normal limits.  eICU Interventions  Pantoprazole  x 1.  Add low-dose Dilaudid  for pain in the setting of renal dysfunction   2135 -n.p.o. while on BiPAP.  Has pain meds ordered-Atarax  and gabapentin .  Hold these given patient is n.p.o. no other intervention  Intervention Category Major Interventions: Shock - evaluation and management  Johnathyn Viscomi 05/24/2023, 8:39 PM

## 2023-05-24 NOTE — H&P (Signed)
 History and Physical    [name] NWG:[id number] DOB: 12/22/1950 DOA: 05/23/2023  PCP: [facility]   Patient coming from: Home   Chief Complaint: SOB, cough   HPI: [name] is a 73 y.o. female with medical history significant for breast cancer, MGUS, COPD, CAD, type 2 diabetes mellitus, hypertension, CKD stage IV, and COPD who presents with shortness of breath and cough.  Patient reports that she developed cough and shortness of breath 4 days ago and has been worsening.  She has had pain in her chest and back but only when she coughs.  She has had general malaise and fatigue but has not noted any fever or chills.  She initially sought care at an urgent care where she was negative for influenza, COVID, and RSV.  She was directed to the ED.  ED Course: Upon arrival to the ED, patient is found to be afebrile and saturating low 90s with tachypnea, mild tachycardia, and hypotension.  Labs are most notable for creatinine 2.39, normal WBC, normal troponin x 2, and normal BNP.  CT is concerning for possible pneumonia at the left base.  PCCM was consulted by the ED physician and the patient was treated with Rocephin , azithromycin , Ativan , IV Solu-Medrol , IV magnesium , IV fluids, albuterol , and BiPAP.  Review of Systems:  All other systems reviewed and apart from HPI, are negative.  Past Medical History:  Diagnosis Date   Ambulates with cane    straight   Angina    no current problems per patient at PAT appt 11/05/18, nitroglycerin  sl 06/30/20   Arthritis    HANDS"   Asthma    Breast cancer (HCC)    Cancer (HCC) 09/04/2017   Right Breast   Carpal tunnel syndrome, bilateral 11/26/2016   Cholelithiasis    Cocaine abuse (HCC) 07/2012   per E.R. drug screen   COPD (chronic obstructive pulmonary disease) (HCC)    per patient   Coronary artery disease    Diabetes mellitus without mention of complication    type 2   Dysphagia    esophageal dysmotility on  03/2011 esophagram    Fatty liver disease, nonalcoholic 2009   on Imaging.    GERD (gastroesophageal reflux disease)    Headache    Hearing loss    bilateral - no hearing aids   History of colonic polyps 2009   adenomatous 2009, HP in 2009, 2011, 2013.    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   Wears dentures    full upper and partial lower   Wears glasses     Past Surgical History:  Procedure Laterality Date   ABDOMINAL ANGIOGRAM  02/20/2011   Procedure: ABDOMINAL ANGIOGRAM;  Surgeon: Sharene Dauer, MD;  Location: [facility] CATH LAB;  Service: Cardiovascular;;   BREAST BIOPSY Left    CARPAL TUNNEL RELEASE Right 07/04/2017   Procedure: RIGHT CARPAL TUNNEL RELEASE;  Surgeon: Brunilda Capra, MD;  Location: [facility];  Service: Orthopedics;  Laterality: Right;   CARPAL TUNNEL RELEASE Left 09/12/2017   Procedure: LEFT CARPAL TUNNEL RELEASE;  Surgeon: Brunilda Capra, MD;  Location: [facility];  Service: Orthopedics;  Laterality: Left;   CATARACT EXTRACTION     CHOLECYSTECTOMY N/A 11/11/2018   Procedure: ATTEMPTED LAPAROSCOPIC CHOLECUSTECOMY,  OPEN CHOLECYSTECTOMY;  Surgeon: Oza Blumenthal, MD;  Location: [facility] OR;  Service: General;  Laterality: N/A;   COLONOSCOPY  11/30/2011   Procedure: COLONOSCOPY;  Surgeon: Pietro Bridegroom, MD;  Location: [facility] ENDOSCOPY;  Service: Endoscopy;  Laterality: N/A;   CORONARY ANGIOPLASTY WITH STENT PLACEMENT  02/20/2011   ERCP N/A 10/03/2018   Procedure: ENDOSCOPIC RETROGRADE CHOLANGIOPANCREATOGRAPHY (ERCP);  Surgeon: Asencion Blacksmith, MD;  Location: [facility];  Service: Endoscopy;  Laterality: N/A;   ESOPHAGOGASTRODUODENOSCOPY  11/30/2011   Procedure: ESOPHAGOGASTRODUODENOSCOPY (EGD);  Surgeon: Pietro Bridegroom, MD;  Location: [facility];  Service: Endoscopy;  Laterality: N/A;    ESOPHAGOGASTRODUODENOSCOPY N/A 02/23/2015   Procedure: ESOPHAGOGASTRODUODENOSCOPY (EGD);  Surgeon: Kenney Peacemaker, MD;  Location: [facility] ENDOSCOPY;  Service: Endoscopy;  Laterality: N/A;   EYE SURGERY Bilateral    cataracts removed   INTRAOPERATIVE CHOLANGIOGRAM N/A 11/11/2018   Procedure: Intraoperative Cholangiogram;  Surgeon: Oza Blumenthal, MD;  Location: [facility] OR;  Service: General;  Laterality: N/A;   IR REMOVAL TUN ACCESS W/ PORT W/O FL MOD SED  06/30/2020   LEFT HEART CATHETERIZATION WITH CORONARY ANGIOGRAM N/A 02/20/2011   Procedure: LEFT HEART CATHETERIZATION WITH CORONARY ANGIOGRAM;  Surgeon: Sharene Dauer, MD;  Location: [facility] CATH LAB;  Service: Cardiovascular;  Laterality: N/A;   MASTECTOMY Right 11/05/2017   MASTECTOMY W/ SENTINEL NODE BIOPSY Right 11/27/2017   MASTECTOMY W/ SENTINEL NODE BIOPSY Right 11/27/2017   Procedure: RIGHT TOTAL MASTECTOMY WITH SENTINEL LYMPH NODE BIOPSY;  Surgeon: Ayesha Lente, MD;  Location: [facility] OR;  Service: General;  Laterality: Right;   PORT A CATH REVISION Left 05/07/2018   Procedure: PORT A CATH REVISION;  Surgeon: Ayesha Lente, MD;  Location: [facility] ORS;  Service: General;  Laterality: Left;   PORTACATH PLACEMENT Left 11/27/2017   Procedure: INSERTION PORT-A-CATH;  Surgeon: Ayesha Lente, MD;  Location: [facility] OR;  Service: General;  Laterality: Left;   REVERSE SHOULDER ARTHROPLASTY Left 03/09/2021   Procedure: REVERSE SHOULDER ARTHROPLASTY;  Surgeon: Ellard Gunning, MD;  Location: [facility];  Service: Orthopedics;  Laterality: Left;   RIGHT COLECTOMY  02/2007   for post polypectomy colonic perforation   SPHINCTEROTOMY  10/03/2018   Procedure: SPHINCTEROTOMY;  Surgeon: Asencion Blacksmith, MD;  Location: [facility];  Service: Endoscopy;;   TUBAL LIGATION      Social History:   reports that she quit smoking about 13 years ago. Her smoking use included cigarettes. She started smoking about 60 years ago. She has a 11.8 pack-year smoking history.  She has never used smokeless tobacco. She reports that she does not currently use alcohol. She reports that she does not currently use drugs after having used the following drugs: Cocaine.  No Known Allergies  Family History  Problem Relation Age of Onset   Heart disease Mother    Diabetes Mother    Cancer Father        unsure what kind   Diabetes Sister    Colon cancer Neg Hx    Breast cancer Neg Hx      Prior to Admission medications   Medication Sig Start Date End Date Taking? Authorizing Provider  acetaminophen  (TYLENOL ) 650 MG CR tablet Take 650 mg by mouth every 8 (eight) hours as needed for pain.   Yes [provider]  albuterol  (PROVENTIL ) (2.5 MG/3ML) 0.083% nebulizer solution Take 3 mLs (2.5 mg total) by nebulization every 6 (six) hours as needed for wheezing or shortness of breath. 08/02/21  Yes Burnette, Leory Rands, PA-C  albuterol  (VENTOLIN  HFA) 108 (90 Base)  MCG/ACT inhaler Inhale 1-2 puffs into the lungs every 6 (six) hours as needed for wheezing or shortness of breath. 09/14/21  Yes Dulce Gibbs M, PA-C  allopurinol  (ZYLOPRIM ) 100 MG tablet Take 100 mg by mouth daily.   Yes [provider]  amLODipine  (NORVASC ) 10 MG tablet Take 10 mg by mouth daily.   Yes [provider]  anastrozole  (ARIMIDEX ) 1 MG tablet TAKE ONE TABLET BY MOUTH ONCE DAILY 06/18/22  Yes Cameron Cea, MD  aspirin  81 MG EC tablet Take 1 tablet (81 mg total) by mouth daily. Patient taking differently: Take 81 mg by mouth in the morning and at bedtime. 10/13/18  Yes Pokhrel, Laxman, MD  atorvastatin  (LIPITOR) 20 MG tablet Take 20 mg by mouth daily.   Yes [provider]  DULoxetine  (CYMBALTA ) 30 MG capsule Take 30 mg by mouth daily.   Yes [provider]  fluticasone  (FLONASE ) 50 MCG/ACT nasal spray Place 2 sprays into both nostrils daily. 05/10/21  Yes Newlin, Enobong, MD  gabapentin  (NEURONTIN ) 100 MG capsule Take 100-200 mg by mouth See admin instructions. Take  one tablet by mouth in the morning and afternoon, then take 2 tablets every night per patient 03/12/22  Yes [provider]  hydrOXYzine  (ATARAX ) 25 MG tablet Take 50 mg by mouth at bedtime. 05/07/23  Yes [provider]  losartan  (COZAAR ) 50 MG tablet TAKE ONE TABLET BY MOUTH DAILY. STOP 25mg  DOSE 08/22/21  Yes Fleming, Zelda W, NP  methocarbamol  (ROBAXIN ) 500 MG tablet Take 1 tablet (500 mg total) by mouth 2 (two) times daily. 03/09/23  Yes Prosperi, Christian H, PA-C  metoprolol  succinate (TOPROL -XL) 50 MG 24 hr tablet Take 1 tablet (50 mg total) by mouth daily. Must have office visit for refills 03/28/22  Yes Newlin, Lavelle Posey, MD  nitroGLYCERIN  (NITROSTAT ) 0.4 MG SL tablet Place 1 tablet (0.4 mg total) under the tongue every 5 (five) minutes as needed for chest pain. 03/15/20  Yes Fleming, Zelda W, NP  ondansetron  (ZOFRAN -ODT) 4 MG disintegrating tablet Take 4 mg by mouth every 8 (eight) hours as needed for nausea or vomiting.   Yes [provider]  pantoprazole  (PROTONIX ) 40 MG tablet TAKE ONE TABLET BY MOUTH EVERY DAY FOR ACID REFLUX 11/02/21  Yes Collins Dean, NP  SYMBICORT  160-4.5 MCG/ACT inhaler Inhale 2 puffs into the lungs in the morning and at bedtime. 02/28/22  Yes [provider]  Blood Pressure Monitor DEVI Please provide patient with insurance approved blood pressure monitor. I10.0 11/15/20   Fleming, Zelda W, NP  diclofenac  Sodium (VOLTAREN ) 1 % GEL Apply 1 Application topically 4 (four) times daily. Patient taking differently: Apply 1 Application topically in the morning and at bedtime. 09/13/21   Gudena, Vinay, MD  Insulin  Pen Needle (EASY TOUCH PEN NEEDLES) 31G X 8 MM MISC Use to inject insulin  once daily and Victoza  once daily. Max of 2 pen needles a day. Must have office visit for refills 03/28/22   Joaquin Mulberry, MD  Misc. Devices MISC Please provide patient with nebulizer mask and tubing. [id number] 12/14/19   Collins Dean, NP  Respiratory Therapy  Supplies (NEBULIZER MASK ADULT) MISC 1 Units by Does not apply route daily as needed. ICD 10 J44.9 05/08/21   Collins Dean, NP    Physical Exam: Vitals:   05/24/23 0246 05/24/23 0321 05/24/23 0345 05/24/23 0400  BP: 90/69 (!) 77/59 (!) 67/57 (!) 69/57  Pulse: (!) 111 (!) 116 (!) 110 (!) 109  Resp: (!) 24  15 (!) 24 14  Temp:      TempSrc:      SpO2: 100% 98% 100% 100%  Weight:      Height:        Constitutional: Labored respirations, no pallor or diaphoresis   Eyes: PERTLA, lids and conjunctivae normal ENMT: Mucous membranes are moist. Posterior pharynx clear of any exudate or lesions.   Neck: supple, no masses  Respiratory: Diminished bilaterally, prolonged expiratory phase. Accessory muscle use.  Cardiovascular: S1 & S2 heard, regular rate and rhythm. No extremity edema.   Abdomen: No distension, no tenderness, soft. Bowel sounds active.  Musculoskeletal: no clubbing / cyanosis. No joint deformity upper and lower extremities.   Skin: no significant rashes, lesions, ulcers. Warm, dry, well-perfused. Neurologic: CN 2-12 grossly intact. Moving all extremities. Alert and oriented. Resting tremor.  Psychiatric: Calm. Cooperative.    Labs and Imaging on Admission: I have personally reviewed following labs and imaging studies  CBC: Recent Labs  Lab 05/23/23 1728  WBC 9.9  NEUTROABS 6.2  HGB 11.3*  HCT 36.6  MCV 92.7  PLT 296   Basic Metabolic Panel: Recent Labs  Lab 05/23/23 1728  NA 137  K 3.6  CL 102  CO2 23  GLUCOSE 185*  BUN 27*  CREATININE 2.39*  CALCIUM  9.7   GFR: Estimated Creatinine Clearance: 16.4 mL/min (A) (by C-G formula based on SCr of 2.39 mg/dL (H)). Liver Function Tests: No results for input(s): "AST", "ALT", "ALKPHOS", "BILITOT", "PROT", "ALBUMIN " in the last 168 hours. No results for input(s): "LIPASE", "AMYLASE" in the last 168 hours. No results for input(s): "AMMONIA" in the last 168 hours. Coagulation Profile: No results for input(s):  "INR", "PROTIME" in the last 168 hours. Cardiac Enzymes: No results for input(s): "CKTOTAL", "CKMB", "CKMBINDEX", "TROPONINI" in the last 168 hours. BNP (last 3 results) No results for input(s): "PROBNP" in the last 8760 hours. HbA1C: No results for input(s): "HGBA1C" in the last 72 hours. CBG: No results for input(s): "GLUCAP" in the last 168 hours. Lipid Profile: No results for input(s): "CHOL", "HDL", "LDLCALC", "TRIG", "CHOLHDL", "LDLDIRECT" in the last 72 hours. Thyroid  Function Tests: No results for input(s): "TSH", "T4TOTAL", "FREET4", "T3FREE", "THYROIDAB" in the last 72 hours. Anemia Panel: No results for input(s): "VITAMINB12", "FOLATE", "FERRITIN", "TIBC", "IRON ", "RETICCTPCT" in the last 72 hours. Urine analysis:    Component Value Date/Time   COLORURINE YELLOW 08/23/2021 1032   APPEARANCEUR HAZY (A) 08/23/2021 1032   LABSPEC 1.012 08/23/2021 1032   PHURINE 5.0 08/23/2021 1032   GLUCOSEU NEGATIVE 08/23/2021 1032   HGBUR NEGATIVE 08/23/2021 1032   BILIRUBINUR NEGATIVE 08/23/2021 1032   KETONESUR NEGATIVE 08/23/2021 1032   PROTEINUR NEGATIVE 08/23/2021 1032   UROBILINOGEN 0.2 03/31/2014 2017   NITRITE NEGATIVE 08/23/2021 1032   LEUKOCYTESUR LARGE (A) 08/23/2021 1032   Sepsis Labs: @LABRCNTIP (procalcitonin:4,lacticidven:4) )No results found for this or any previous visit (from the past 240 hours).   Radiological Exams on Admission: CT CHEST WO CONTRAST Result Date: 05/24/2023 CLINICAL DATA:  Pneumonia, shortness of breath, cough EXAM: CT CHEST WITHOUT CONTRAST TECHNIQUE: Multidetector CT imaging of the chest was performed following the standard protocol without IV contrast. RADIATION DOSE REDUCTION: This exam was performed according to the departmental dose-optimization program which includes automated exposure control, adjustment of the mA and/or kV according to patient size and/or use of iterative reconstruction technique. COMPARISON:  CT 11/02/2022.  Chest x-ray  05/23/2023 FINDINGS: Cardiovascular: Heart is upper limits normal scar sec heart is borderline in size. Diffuse 3  vessel coronary artery disease and aortic atherosclerosis. No aneurysm. Mediastinum/Nodes: No mediastinal, hilar, or axillary adenopathy. Trachea and esophagus are unremarkable. Thyroid  unremarkable. Lungs/Pleura: Bibasilar airspace opacities, predominantly linear in the right lung base somewhat more confluent at the left base. This could reflect atelectasis or pneumonia. No effusions. Upper Abdomen: No acute findings Musculoskeletal: Chest wall soft tissues are unremarkable. No acute bony abnormality. IMPRESSION: Bilateral lower lobe airspace opacities, favor atelectasis in the right base. Left basilar opacity could reflect atelectasis or pneumonia. Borderline heart size.  Diffuse 3 vessel coronary artery disease. Aortic Atherosclerosis (ICD10-I70.0). Electronically Signed   By: Janeece Mechanic M.D.   On: 05/24/2023 01:17   DG Chest Port 1 View Result Date: 05/23/2023 CLINICAL DATA:  Shortness of breath and cough. EXAM: PORTABLE CHEST 1 VIEW COMPARISON:  Chest x-ray 05/23/2023. FINDINGS: The heart size and mediastinal contours are within normal limits. There is a small right pleural effusion. There are minimal patchy opacities in the lung bases. There is no pneumothorax. Left shoulder arthroplasty is present. No acute fractures are seen. IMPRESSION: 1. Small right pleural effusion. 2. Minimal patchy opacities in the lung bases, likely atelectasis. Electronically Signed   By: Tyron Gallon M.D.   On: 05/23/2023 19:47   DG Chest 2 View Result Date: 05/23/2023 CLINICAL DATA:  Productive cough EXAM: CHEST - 2 VIEW COMPARISON:  X-ray 03/09/2023 and older FINDINGS: No pneumothorax, effusion or edema. Normal cardiopericardial silhouette with calcified aorta. Mild left lung base opacity. Left lower lobe infiltrates possible. Recommend follow-up. Left shoulder reverse arthroplasty. Degenerative changes along  the spine. Dense right upper lung nodule, likely calcified. IMPRESSION: Mild left lung base opacity. Subtle infiltrates possible. Recommend follow-up. Electronically Signed   By: Adrianna Horde M.D.   On: 05/23/2023 17:40    EKG: Independently reviewed. Sinus tachycardia, rate 104, PVCs.   Assessment/Plan   1. COPD exacerbation; acute hypoxic respiratory failure; ?pneumonia  - Culture sputum, check respiratory virus panel, continue systemic steroids and antibiotics, continue short-acting bronchodilators, continue BiPAP for now  2. Hypotension  - Responded to IVF initially but SBP went back down to 70s - Hold antihypertensives, culture blood, check lactate, give additional IVF bolus, repeat CBC and chem panel    3. Type II DM  - Check CBGs and use low-intensity SSI for now   4. CKD IV  - SCr is 2.39 on admission, baseline may be closer to 2.0  - Renally-dose medications, monitor    5. CAD  - No anginal complaints  - Resume ASA and Lipitor once off of BiPAP    DVT prophylaxis: sq heparin   Code Status: Full  Level of Care: Level of care: Progressive Family Communication: Sister updated by phone   Disposition Plan:  Patient is from: Home  Anticipated d/c is to: TBD Anticipated d/c date is: 05/27/23  Patient currently: pending stable BP, improved respiratory status  Consults called: PCCM  Admission status: inpatient     Walton Guppy, MD [facility]  05/24/2023, 4:31 AM

## 2023-05-24 NOTE — ED Notes (Signed)
 Pt alert, oriented and speaking on phone to sister. Pt / sister want to avoid intubation at all costs. Request to use Bipap ( pt agrees) and only intubate if no other option.   Pt tolerating Bipap and settings as before.

## 2023-05-24 NOTE — Progress Notes (Signed)
 Patient transported from ED15 to 2M07 with RT and RN, no complications noted.

## 2023-05-24 NOTE — ED Notes (Signed)
 Admitting MD aware pt maxed out on Levophed .

## 2023-05-24 NOTE — ED Provider Notes (Signed)
 Patient had been deteriorating and hospitalist consulted with PCCM who recommended intubation.  I was prepared to go to intubate the patient when she decided that she did not want to be intubated and agreed to go back on BiPAP.  PCCM feels she can be admitted to progressive care.  I have discussed case with Dr. Brice Campi of [facility], who agrees to come and admit the patient.   Alissa April, MD 05/24/23 [phone]

## 2023-05-25 DIAGNOSIS — N179 Acute kidney failure, unspecified: Secondary | ICD-10-CM

## 2023-05-25 DIAGNOSIS — A419 Sepsis, unspecified organism: Secondary | ICD-10-CM | POA: Diagnosis not present

## 2023-05-25 DIAGNOSIS — J441 Chronic obstructive pulmonary disease with (acute) exacerbation: Secondary | ICD-10-CM | POA: Diagnosis not present

## 2023-05-25 DIAGNOSIS — J9601 Acute respiratory failure with hypoxia: Secondary | ICD-10-CM | POA: Diagnosis not present

## 2023-05-25 DIAGNOSIS — I10 Essential (primary) hypertension: Secondary | ICD-10-CM

## 2023-05-25 DIAGNOSIS — M109 Gout, unspecified: Secondary | ICD-10-CM

## 2023-05-25 LAB — COMPREHENSIVE METABOLIC PANEL WITH GFR
ALT: 10 U/L (ref 0–44)
AST: 14 U/L — ABNORMAL LOW (ref 15–41)
Albumin: 3.3 g/dL — ABNORMAL LOW (ref 3.5–5.0)
Alkaline Phosphatase: 86 U/L (ref 38–126)
Anion gap: 12 (ref 5–15)
BUN: 29 mg/dL — ABNORMAL HIGH (ref 8–23)
CO2: 20 mmol/L — ABNORMAL LOW (ref 22–32)
Calcium: 9.5 mg/dL (ref 8.9–10.3)
Chloride: 107 mmol/L (ref 98–111)
Creatinine, Ser: 2.18 mg/dL — ABNORMAL HIGH (ref 0.44–1.00)
GFR, Estimated: 23 mL/min — ABNORMAL LOW (ref >60)
Glucose, Bld: 205 mg/dL — ABNORMAL HIGH (ref 70–99)
Potassium: 4.9 mmol/L (ref 3.5–5.1)
Sodium: 139 mmol/L (ref 135–145)
Total Bilirubin: 0.4 mg/dL (ref 0.0–1.2)
Total Protein: 7.1 g/dL (ref 6.5–8.1)

## 2023-05-25 LAB — CBC
HCT: 28.9 % — ABNORMAL LOW (ref 36.0–46.0)
Hemoglobin: 8.9 g/dL — ABNORMAL LOW (ref 12.0–15.0)
MCH: 28.8 pg (ref 26.0–34.0)
MCHC: 30.8 g/dL (ref 30.0–36.0)
MCV: 93.5 fL (ref 80.0–100.0)
Platelets: 232 10*3/uL (ref 150–400)
RBC: 3.09 MIL/uL — ABNORMAL LOW (ref 3.87–5.11)
RDW: 14.6 % (ref 11.5–15.5)
WBC: 19.8 10*3/uL — ABNORMAL HIGH (ref 4.0–10.5)
nRBC: 0.1 % (ref 0.0–0.2)

## 2023-05-25 LAB — GLUCOSE, CAPILLARY
Glucose-Capillary: 126 mg/dL — ABNORMAL HIGH (ref 70–99)
Glucose-Capillary: 196 mg/dL — ABNORMAL HIGH (ref 70–99)
Glucose-Capillary: 201 mg/dL — ABNORMAL HIGH (ref 70–99)
Glucose-Capillary: 298 mg/dL — ABNORMAL HIGH (ref 70–99)
Glucose-Capillary: 300 mg/dL — ABNORMAL HIGH (ref 70–99)
Glucose-Capillary: 345 mg/dL — ABNORMAL HIGH (ref 70–99)
Glucose-Capillary: 63 mg/dL — ABNORMAL LOW (ref 70–99)

## 2023-05-25 LAB — TROPONIN I (HIGH SENSITIVITY)
Troponin I (High Sensitivity): 62 ng/L — ABNORMAL HIGH (ref <18)
Troponin I (High Sensitivity): 60 ng/L — ABNORMAL HIGH (ref <18)

## 2023-05-25 LAB — PROCALCITONIN: Procalcitonin: 0.21 ng/mL

## 2023-05-25 MED ORDER — POLYETHYLENE GLYCOL 3350 17 G PO PACK
17.0000 g | PACK | Freq: Every day | ORAL | Status: DC
  Administered 2023-05-25 – 2023-06-02 (×6): 17 g via ORAL
  Filled 2023-05-25 (×7): qty 1

## 2023-05-25 MED ORDER — INSULIN GLARGINE-YFGN 100 UNIT/ML ~~LOC~~ SOLN
12.0000 [IU] | Freq: Every day | SUBCUTANEOUS | Status: DC
  Administered 2023-05-25: 12 [IU] via SUBCUTANEOUS
  Filled 2023-05-25 (×2): qty 0.12

## 2023-05-25 MED ORDER — ENSURE ENLIVE PO LIQD
237.0000 mL | Freq: Two times a day (BID) | ORAL | Status: DC
  Administered 2023-05-25 – 2023-05-31 (×13): 237 mL via ORAL

## 2023-05-25 MED ORDER — FERROUS SULFATE 325 (65 FE) MG PO TABS
325.0000 mg | ORAL_TABLET | Freq: Two times a day (BID) | ORAL | Status: DC
  Administered 2023-05-26 – 2023-06-02 (×15): 325 mg via ORAL
  Filled 2023-05-25 (×18): qty 1

## 2023-05-25 MED ORDER — INSULIN ASPART 100 UNIT/ML IJ SOLN
0.0000 [IU] | INTRAMUSCULAR | Status: DC
  Administered 2023-05-25: 15 [IU] via SUBCUTANEOUS
  Administered 2023-05-25: 3 [IU] via SUBCUTANEOUS
  Administered 2023-05-25 (×2): 11 [IU] via SUBCUTANEOUS
  Administered 2023-05-25: 7 [IU] via SUBCUTANEOUS
  Administered 2023-05-25: 4 [IU] via SUBCUTANEOUS
  Administered 2023-05-26: 15 [IU] via SUBCUTANEOUS
  Administered 2023-05-26: 4 [IU] via SUBCUTANEOUS
  Administered 2023-05-26: 20 [IU] via SUBCUTANEOUS
  Administered 2023-05-26: 4 [IU] via SUBCUTANEOUS
  Administered 2023-05-26: 15 [IU] via SUBCUTANEOUS
  Administered 2023-05-27: 7 [IU] via SUBCUTANEOUS
  Administered 2023-05-27 (×2): 4 [IU] via SUBCUTANEOUS
  Administered 2023-05-27: 15 [IU] via SUBCUTANEOUS
  Administered 2023-05-27: 11 [IU] via SUBCUTANEOUS
  Administered 2023-05-27: 7 [IU] via SUBCUTANEOUS
  Administered 2023-05-27: 3 [IU] via SUBCUTANEOUS
  Administered 2023-05-28: 4 [IU] via SUBCUTANEOUS
  Administered 2023-05-28: 11 [IU] via SUBCUTANEOUS
  Administered 2023-05-28: 4 [IU] via SUBCUTANEOUS
  Administered 2023-05-28: 15 [IU] via SUBCUTANEOUS
  Administered 2023-05-28: 20 [IU] via SUBCUTANEOUS
  Administered 2023-05-28: 7 [IU] via SUBCUTANEOUS
  Administered 2023-05-29: 20 [IU] via SUBCUTANEOUS
  Administered 2023-05-29: 3 [IU] via SUBCUTANEOUS

## 2023-05-25 MED ORDER — METHYLPREDNISOLONE SODIUM SUCC 40 MG IJ SOLR
40.0000 mg | Freq: Four times a day (QID) | INTRAMUSCULAR | Status: DC
  Administered 2023-05-25 – 2023-05-28 (×12): 40 mg via INTRAVENOUS
  Filled 2023-05-25 (×14): qty 1

## 2023-05-25 NOTE — Progress Notes (Signed)
 Patient felt SOB and wanted to go back on BIPAP at this time. Patient had slight increased WOB, placed on BIPAP. Vitals stable throughout. RT will continue to monitor. RN aware.

## 2023-05-25 NOTE — Plan of Care (Signed)
   Problem: Coping: Goal: Ability to adjust to condition or change in health will improve Outcome: Progressing   Problem: Metabolic: Goal: Ability to maintain appropriate glucose levels will improve Outcome: Progressing   Problem: Skin Integrity: Goal: Risk for impaired skin integrity will decrease Outcome: Progressing

## 2023-05-25 NOTE — Progress Notes (Signed)
 NAME:  [name], MRN:  [id number], DOB:  [birth year], LOS: 1 ADMISSION DATE:  05/23/2023, CONSULTATION DATE:  05/24/23 REFERRING MD:  EDP, CHIEF COMPLAINT:  aecopd with hypercarbia   History of Present Illness:  73 yo female presented from urgent care with complaints of shortness of breath and cough for 4 days after she was noted to be hypoxic at urgent care. She was negative for flu/covid at clinic. When she presented to [facility] she was wheezing and given duoneb. She was complaining of chest pain when she coughed only. She denied fever/chills but endorses general fatigue and overall "not feeling well". Cough is productive with yellow sputum, oxygen  sats where noted to be 91% and she was in resp distress. She was placed on NIV with improvement in resp distress, [facility] was asked to admit.   However after some time she removed her NIV and refused replacement. CCM was consulted for evaluation. Upon my eval. Pt was on [place] 2L with sats 100% but with accessory muscle use and tachypneic. She was rather somnolent but arousable albeit confused and not able to provide history for me. I recommended intubation for pt in light of her exam and refusal for NIV and continued decompensation. Primary physician at this time was able to d/w family and they have declined intubation and agreed to have NIV placed back on.   At this time pt is stable for progressive care. However, should she or her family change their wishes and request intubation and she decompensates, requiring it again; ccm will be happy to assist and transfer pt to ICU.  Pertinent  Medical History  Copd Htn Anxiety Gout Hyperlipidemia GERD T2dm with hyperglycemia   Significant Hospital Events: Including procedures, antibiotic start and stop dates in addition to other pertinent events   Admitted to [facility] 4/18, Ccm consulted for hypotension  Interim History / Subjective:  Has not had a bipap break since yesterday, wants to try again. Denies complaints.    Objective   Blood pressure (!) 108/57, pulse (!) 54, temperature 98.7 F (37.1 C), temperature source Axillary, resp. rate 12, height 4\' 11"  (1.499 m), weight 63 kg, SpO2 93%.    Vent Mode: PCV;BIPAP FiO2 (%):  [40 %-50 %] 40 % Set Rate:  [18 bmp] 18 bmp PEEP:  [5 cmH20] 5 cmH20 Pressure Support:  [13 cmH20-15 cmH20] 13 cmH20   Intake/Output Summary (Last 24 hours) at 05/25/2023 7253 Last data filed at 05/25/2023 0700 Gross per 24 hour  Intake 3398.02 ml  Output 650 ml  Net 2748.02 ml   Filed Weights   05/23/23 1826  Weight: 63 kg    Examination: General: ill appearing elderly woman lying in bed in NAD HENT: [place]/AT, bipap mask in place Lungs: breathing comfortably on bipap, bilateral expiratory wheezing- more pronounced than yesterday Cardiovascular: S1S2, RRR Abdomen: obese, soft, NT Extremities: no peripheral edema, no cyanosis Neuro: awake and alert, moving all extremities, trying to talk and nodding to answer questions  PCT 0.21 Bicarb 20 BUN 29 Cr 2.18 WBC 19.8 H/H 8.9/28.9 Platelets 232 Trop 62 Blood cultures NGTD RVP negative  Iron  19, TIBC reduced, reduced saturation ratios Ferritin 48  Resolved Hospital Problem list   Lactic acidosis, improved   Assessment & Plan:  Acute exacerbation COPD Acute hypoxic respiratory failure -Trial off bipap this morning on [place]; BiPAP PRN. Still needs ICU monitoring for severe respiratory failure.  -con't bronchodilators- brovana , yupelri , duonebs -increase steroids with her reported history of asthma -con't empiric CAP antibiotics- ceftriaxone   &  azithro -pulmonary hygiene -strep pneumonia and legionella antigens never collected  Septic shock due to presumed pneumonia; CXR had not yet demonstrated an infiltrate in the ED -weaning off NE; minimal requirements currently -con't antibiotics  AKI on CKD 4; improving -renally dose meds, avoid nephrotoxic meds -strict I/O -euvolemic on exam  Chronic normocytic  anemia, IDA -add enteral iron  when able to take PO -transfuse for Hb <7 or hemodynamically significant bleeding  Uncontrolled hyperglycemia, uncontrolled DM; A1c 8.2. Not in DKA. -increase glargine to 12 units daily with increased steroid dose -SSI PRN -goal BVG 140-180  HTN -con't to hold PTA amlodipine , losartan  while on pressors  Hyperlipidemia -statin  Gout -allopurinol   H/o GERD -con't PPI BID-- PTA regimen. Keep IV until able to take PO due to high dose steroids.  No family at bedside today. Had updated a close friend of hers at bedside yesterday with her permission.  Best Practice (right click and "Reselect all SmartList Selections" daily)   Diet/type: NPO w/ oral meds DVT prophylaxis prophylactic heparin   Pressure ulcer(s): N/A GI prophylaxis: PPI Lines: N/A Foley:  N/A Code Status:  per primary, refusing intubation > need to clarify this further, but she does not require it currently Last date of multidisciplinary goals of care discussion [per primary]  Labs   CBC: Recent Labs  Lab 05/23/23 1728 05/24/23 0151 05/24/23 0605 05/25/23 0236  WBC 9.9  --  14.4* 19.8*  NEUTROABS 6.2  --   --   --   HGB 11.3* 11.6* 9.4* 8.9*  HCT 36.6 34.0* 30.7* 28.9*  MCV 92.7  --  93.9 93.5  PLT 296  --  258 232    Basic Metabolic Panel: Recent Labs  Lab 05/23/23 1728 05/24/23 0151 05/24/23 0605 05/24/23 0942 05/25/23 0236  NA 137 135 135 134* 139  K 3.6 3.3* 4.1 4.3 4.9  CL 102  --  101 103 107  CO2 23  --  16* 16* 20*  GLUCOSE 185*  --  615* 539* 205*  BUN 27*  --  31* 30* 29*  CREATININE 2.39*  --  2.70* 2.67* 2.18*  CALCIUM  9.7  --  8.9 9.3 9.5  MG  --   --  2.1  --   --    GFR: Estimated Creatinine Clearance: 18 mL/min (A) (by C-G formula based on SCr of 2.18 mg/dL (H)). Recent Labs  Lab 05/23/23 1728 05/24/23 0435 05/24/23 0605 05/24/23 0942 05/24/23 1251 05/24/23 1412 05/25/23 0236  PROCALCITON  --   --  0.10  --   --   --  0.21  WBC 9.9  --   14.4*  --   --   --  19.8*  LATICACIDVEN  --  7.8*  --  5.4* 4.0* 3.1*  --       Critical care time:  35 min.     Joesph Mussel, DO 05/25/23 8:31 AM [facility]  For contact information, see Amion. If no response to pager, please call PCCM consult pager. After hours, 7PM- 7AM, please call Elink.

## 2023-05-25 NOTE — Inpatient Diabetes Management (Signed)
 Inpatient Diabetes Program Recommendations  AACE/ADA: New Consensus Statement on Inpatient Glycemic Control   Target Ranges:  Prepandial:   less than 140 mg/dL      Peak postprandial:   less than 180 mg/dL (1-2 hours)      Critically ill patients:  140 - 180 mg/dL    Latest Reference Range & Units 05/25/23 01:22 05/25/23 02:34 05/25/23 07:29  Glucose-Capillary 70 - 99 mg/dL 161 (H) 096 (H) 63 (L)    Latest Reference Range & Units 05/24/23 12:20 05/24/23 14:24 05/24/23 15:23 05/24/23 16:44 05/24/23 20:05 05/24/23 23:28  Glucose-Capillary 70 - 99 mg/dL 045 (H) 409 (H) 811 (H) 190 (H) 177 (H) 222 (H)    Latest Reference Range & Units 05/23/23 17:28  Glucose 70 - 99 mg/dL 914 (H)    Latest Reference Range & Units 05/24/23 06:05 05/24/23 09:42 05/25/23 02:36  CO2 22 - 32 mmol/L 16 (L) 16 (L) 20 (L)  Glucose 70 - 99 mg/dL 782 (HH) 956 (HH) 213 (H)  Anion gap 5 - 15  18 (H) 15 12    Latest Reference Range & Units 05/24/23 09:42  Beta-Hydroxybutyric Acid 0.05 - 0.27 mmol/L 0.08    Latest Reference Range & Units 05/24/23 09:42 05/24/23 12:51 05/24/23 14:12  Lactic Acid, Venous 0.5 - 1.9 mmol/L 5.4 (HH) 4.0 (HH) 3.1 (HH)   Review of Glycemic Control  Diabetes history: DM2 Outpatient Diabetes medications: None listed on med list; per office note on 05/01/23 by Dr. Gudena (has meds listed as Trulicity 0.75 mg Qweek) and Lantus  5 units QHS Current orders for Inpatient glycemic control:  Semglee  12 units at bedtime, Novolog  0-20 units Q4H; Solumedrol 40 mg Q6H  Inpatient Diabetes Program Recommendations:    Insulin : Noted CBG down to 63 at 7:29 am today. Hypoglycemia likely due to getting Novolog  7 units at 1:22 am and Novolog  4 units at 3:14 am today (doses given within 2 hours of each other). May want to consider decreasing Novolog  correction to 0-15 units Q4H.  NOTE: Noted consult for Diabetes Coordinator. Diabetes Coordinator is not on campus over the weekend but available by pager from  8am to 5pm for questions or concerns. Chart reviewed. Noted patient went to Urgent Care on 4/17 at 3:07 pm, and lab glucose was 185 mg/dl on 0/86/57 at 84:69. Noted patient received Solumedrol 125 mg at 19:29 on 05/23/23 and was transferred to [facility] ED. Lab glucose 615 mg/dl on 08/04/50 at 8:41 am and patient was started on IV insulin  which has been transitioned to SQ insulin  regimen.   Thanks, Beacher Limerick, RN, MSN, CDCES Diabetes Coordinator Inpatient Diabetes Program [phone] (Team Pager from 8am to 5pm)

## 2023-05-25 NOTE — Progress Notes (Signed)
 eLink Physician-Brief Progress Note Patient Name: [name] DOB: 12-28-1950 MRN: [id number]   Date of Service  05/25/2023  HPI/Events of Note  Unable to give PO meds. Pt on BiPAP.  Gabapentin   Atarax   eICU Interventions  Continue to hold meds     Intervention Category Minor Interventions: Routine modifications to care plan (e.g. PRN medications for pain, fever)  [name] 05/25/2023, 9:53 PM

## 2023-05-26 DIAGNOSIS — A419 Sepsis, unspecified organism: Secondary | ICD-10-CM | POA: Diagnosis not present

## 2023-05-26 DIAGNOSIS — J9601 Acute respiratory failure with hypoxia: Secondary | ICD-10-CM | POA: Diagnosis not present

## 2023-05-26 DIAGNOSIS — N179 Acute kidney failure, unspecified: Secondary | ICD-10-CM | POA: Diagnosis not present

## 2023-05-26 DIAGNOSIS — J441 Chronic obstructive pulmonary disease with (acute) exacerbation: Secondary | ICD-10-CM | POA: Diagnosis not present

## 2023-05-26 LAB — COMPREHENSIVE METABOLIC PANEL WITH GFR
ALT: 12 U/L (ref 0–44)
AST: 20 U/L (ref 15–41)
Albumin: 3.4 g/dL — ABNORMAL LOW (ref 3.5–5.0)
Alkaline Phosphatase: 94 U/L (ref 38–126)
Anion gap: 14 (ref 5–15)
BUN: 49 mg/dL — ABNORMAL HIGH (ref 8–23)
CO2: 18 mmol/L — ABNORMAL LOW (ref 22–32)
Calcium: 9.9 mg/dL (ref 8.9–10.3)
Chloride: 102 mmol/L (ref 98–111)
Creatinine, Ser: 2.4 mg/dL — ABNORMAL HIGH (ref 0.44–1.00)
GFR, Estimated: 21 mL/min — ABNORMAL LOW (ref >60)
Glucose, Bld: 369 mg/dL — ABNORMAL HIGH (ref 70–99)
Potassium: 5.6 mmol/L — ABNORMAL HIGH (ref 3.5–5.1)
Sodium: 134 mmol/L — ABNORMAL LOW (ref 135–145)
Total Bilirubin: 0.5 mg/dL (ref 0.0–1.2)
Total Protein: 7.5 g/dL (ref 6.5–8.1)

## 2023-05-26 LAB — GLUCOSE, CAPILLARY
Glucose-Capillary: 180 mg/dL — ABNORMAL HIGH (ref 70–99)
Glucose-Capillary: 186 mg/dL — ABNORMAL HIGH (ref 70–99)
Glucose-Capillary: 280 mg/dL — ABNORMAL HIGH (ref 70–99)
Glucose-Capillary: 302 mg/dL — ABNORMAL HIGH (ref 70–99)
Glucose-Capillary: 316 mg/dL — ABNORMAL HIGH (ref 70–99)
Glucose-Capillary: 377 mg/dL — ABNORMAL HIGH (ref 70–99)

## 2023-05-26 LAB — TROPONIN I (HIGH SENSITIVITY): Troponin I (High Sensitivity): 26 ng/L — ABNORMAL HIGH (ref <18)

## 2023-05-26 LAB — CBC
HCT: 33.4 % — ABNORMAL LOW (ref 36.0–46.0)
Hemoglobin: 10.3 g/dL — ABNORMAL LOW (ref 12.0–15.0)
MCH: 28.6 pg (ref 26.0–34.0)
MCHC: 30.8 g/dL (ref 30.0–36.0)
MCV: 92.8 fL (ref 80.0–100.0)
Platelets: 259 10*3/uL (ref 150–400)
RBC: 3.6 MIL/uL — ABNORMAL LOW (ref 3.87–5.11)
RDW: 14.5 % (ref 11.5–15.5)
WBC: 17.1 10*3/uL — ABNORMAL HIGH (ref 4.0–10.5)
nRBC: 0.1 % (ref 0.0–0.2)

## 2023-05-26 MED ORDER — INSULIN GLARGINE-YFGN 100 UNIT/ML ~~LOC~~ SOLN
20.0000 [IU] | Freq: Two times a day (BID) | SUBCUTANEOUS | Status: DC
  Administered 2023-05-26 – 2023-05-28 (×4): 20 [IU] via SUBCUTANEOUS
  Filled 2023-05-26 (×7): qty 0.2

## 2023-05-26 MED ORDER — INSULIN GLARGINE-YFGN 100 UNIT/ML ~~LOC~~ SOLN
20.0000 [IU] | Freq: Every day | SUBCUTANEOUS | Status: DC
  Administered 2023-05-26: 20 [IU] via SUBCUTANEOUS
  Filled 2023-05-26: qty 0.2

## 2023-05-26 MED ORDER — SODIUM ZIRCONIUM CYCLOSILICATE 10 G PO PACK
10.0000 g | PACK | Freq: Once | ORAL | Status: AC
  Administered 2023-05-26: 10 g via ORAL
  Filled 2023-05-26: qty 1

## 2023-05-26 MED ORDER — OXYCODONE HCL 5 MG PO TABS
5.0000 mg | ORAL_TABLET | ORAL | Status: DC | PRN
  Administered 2023-05-26 – 2023-05-28 (×5): 5 mg via ORAL
  Filled 2023-05-26 (×5): qty 1

## 2023-05-26 MED ORDER — INSULIN ASPART 100 UNIT/ML IJ SOLN
5.0000 [IU] | Freq: Three times a day (TID) | INTRAMUSCULAR | Status: DC
  Administered 2023-05-26 (×2): 5 [IU] via SUBCUTANEOUS

## 2023-05-26 MED ORDER — INSULIN ASPART 100 UNIT/ML IJ SOLN: 8.0000 [IU] | Freq: Three times a day (TID) | INTRAMUSCULAR | Status: DC

## 2023-05-26 MED ORDER — LIDOCAINE 5 % EX PTCH
1.0000 | MEDICATED_PATCH | CUTANEOUS | Status: DC
  Administered 2023-05-26 – 2023-06-02 (×8): 1 via TRANSDERMAL
  Filled 2023-05-26 (×8): qty 1

## 2023-05-26 MED ORDER — INSULIN ASPART 100 UNIT/ML IJ SOLN
8.0000 [IU] | Freq: Three times a day (TID) | INTRAMUSCULAR | Status: DC
  Administered 2023-05-26 – 2023-05-27 (×3): 8 [IU] via SUBCUTANEOUS

## 2023-05-26 NOTE — Plan of Care (Signed)
  Problem: Coping: Goal: Ability to adjust to condition or change in health will improve Outcome: Progressing   Problem: Nutritional: Goal: Maintenance of adequate nutrition will improve Outcome: Progressing   Problem: Skin Integrity: Goal: Risk for impaired skin integrity will decrease Outcome: Progressing   Problem: Clinical Measurements: Goal: Signs and symptoms of infection will decrease Outcome: Progressing   Problem: Respiratory: Goal: Ability to maintain adequate ventilation will improve Outcome: Progressing

## 2023-05-26 NOTE — Evaluation (Signed)
 Physical Therapy Evaluation Patient Details Name: [name] MRN: [id number] DOB: 08-04-1950 Today's Date: 05/26/2023  History of Present Illness  Pt is 73 year old presented to [facility] from urgent care on  05/23/23 for SOB. Pt with copd exacerbation and septic shock due to presumed PNA. Pt required bipap.  Pt with costocondral pain. PMH - breast CA, copd, cad, DM, HTN, ckd, lt shoulder replacement, anxiety, gout  Clinical Impression  Pt admitted with above diagnosis and presents to PT with functional limitations due to deficits listed below (See PT problem list). Pt needs skilled PT to maximize independence and safety. Pt typically ambulatory at home with quad cane. Currently pt weak and needed mod assist to get to bedside recliner. Has family available at home. Hopefully will progress quickly as respiratory status improves. If she doesn't will need to see if family can provide incr assist or if need to look at alternative dc plan.           If plan is discharge home, recommend the following: A lot of help with walking and/or transfers;A lot of help with bathing/dressing/bathroom;Assist for transportation;Help with stairs or ramp for entrance   Can travel by private vehicle        Equipment Recommendations Rollator (4 wheels) (pt reports her rollator is old and needs to be replaced)  Recommendations for Other Services       Functional Status Assessment Patient has had a recent decline in their functional status and demonstrates the ability to make significant improvements in function in a reasonable and predictable amount of time.     Precautions / Restrictions Precautions Precautions: Fall;Other (comment) Precaution/Restrictions Comments: watch SpO2      Mobility  Bed Mobility Overal bed mobility: Needs Assistance Bed Mobility: Supine to Sit     Supine to sit: Mod assist, HOB elevated     General bed mobility comments: Assist to elevate trunk into sitting and bring hips to  EOB    Transfers Overall transfer level: Needs assistance Equipment used: 1 person hand held assist Transfers: Sit to/from Stand, Bed to chair/wheelchair/BSC Sit to Stand: Mod assist   Step pivot transfers: Mod assist       General transfer comment: Assist to power up and for balance. Bed to chair with bil shelf arm support for support and balance as pt took short shuffling steps to chair.    Ambulation/Gait               General Gait Details: Did not attempt due to activity tolerance  Stairs            Wheelchair Mobility     Tilt Bed    Modified Rankin (Stroke Patients Only)       Balance Overall balance assessment: Needs assistance Sitting-balance support: Bilateral upper extremity supported, Feet unsupported Sitting balance-Leahy Scale: Poor Sitting balance - Comments: UE support   Standing balance support: Bilateral upper extremity supported Standing balance-Leahy Scale: Poor Standing balance comment: BUE support and min to mod assist for static standing                             Pertinent Vitals/Pain Pain Assessment Pain Assessment: Faces Faces Pain Scale: Hurts even more Pain Location: back Pain Descriptors / Indicators: Grimacing, Guarding Pain Intervention(s): Limited activity within patient's tolerance, Monitored during session, Repositioned (Improved with sitting in chair)    Home Living Family/patient expects to be discharged to:: Private residence Living  Arrangements: Children;Other relatives (Son, grandson, grandson's wife and kids) Available Help at Discharge: Family;Available 24 hours/day Type of Home: House Home Access: Stairs to enter Entrance Stairs-Rails: Right Entrance Stairs-Number of Steps: 4   Home Layout: One level Home Equipment: Rollator (4 wheels);Cane - single point;Toilet riser (rollator needs to be replaced)      Prior Function Prior Level of Function : Independent/Modified Independent              Mobility Comments: Using quad cane       Extremity/Trunk Assessment   Upper Extremity Assessment Upper Extremity Assessment: Defer to OT evaluation    Lower Extremity Assessment Lower Extremity Assessment: Generalized weakness       Communication   Communication Communication: Impaired Factors Affecting Communication: Hearing impaired    Cognition Arousal: Alert Behavior During Therapy: WFL for tasks assessed/performed   PT - Cognitive impairments: No family/caregiver present to determine baseline                       PT - Cognition Comments: Slow processing Following commands: Intact       Cueing Cueing Techniques: Verbal cues, Tactile cues     General Comments General comments (skin integrity, edema, etc.): VSS on 4L O2. Dyspnea 3/4.    Exercises     Assessment/Plan    PT Assessment Patient needs continued PT services  PT Problem List Decreased strength;Decreased balance;Decreased mobility;Decreased activity tolerance;Cardiopulmonary status limiting activity       PT Treatment Interventions DME instruction;Gait training;Stair training;Functional mobility training;Therapeutic activities;Therapeutic exercise;Balance training;Patient/family education    PT Goals (Current goals can be found in the Care Plan section)  Acute Rehab PT Goals Patient Stated Goal: go home PT Goal Formulation: With patient Time For Goal Achievement: 06/09/23 Potential to Achieve Goals: Fair    Frequency Min 2X/week     Co-evaluation               AM-PAC PT "6 Clicks" Mobility  Outcome Measure Help needed turning from your back to your side while in a flat bed without using bedrails?: A Lot Help needed moving from lying on your back to sitting on the side of a flat bed without using bedrails?: A Lot Help needed moving to and from a bed to a chair (including a wheelchair)?: A Lot Help needed standing up from a chair using your arms (e.g., wheelchair or  bedside chair)?: A Lot Help needed to walk in hospital room?: Total Help needed climbing 3-5 steps with a railing? : Total 6 Click Score: 10    End of Session Equipment Utilized During Treatment: Oxygen  Activity Tolerance: Treatment limited secondary to medical complications (Comment);Patient limited by fatigue (respiratory status) Patient left: in chair;with call bell/phone within reach;with chair alarm set Nurse Communication: Mobility status PT Visit Diagnosis: Unsteadiness on feet (R26.81);Other abnormalities of gait and mobility (R26.89);Muscle weakness (generalized) (M62.81);Pain Pain - Right/Left: Right Pain - part of body:  (back)    Time: 1610-9604 PT Time Calculation (min) (ACUTE ONLY): 24 min   Charges:   PT Evaluation $PT Eval Moderate Complexity: 1 Mod PT Treatments $Therapeutic Activity: 8-22 mins PT General Charges $$ ACUTE PT VISIT: 1 Visit         [facility] PT Acute Rehabilitation Services Office [phone]   Pura Browns [facility] 05/26/2023, 1:16 PM

## 2023-05-26 NOTE — Progress Notes (Signed)
 NAME:  [name], MRN:  [id number], DOB:  31-Jan-1951, LOS: 2 ADMISSION DATE:  05/23/2023, CONSULTATION DATE:  05/24/23 REFERRING MD:  EDP, CHIEF COMPLAINT:  aecopd with hypercarbia   History of Present Illness:  73 yo female presented from urgent care with complaints of shortness of breath and cough for 4 days after she was noted to be hypoxic at urgent care. She was negative for flu/covid at clinic. When she presented to [facility] she was wheezing and given duoneb. She was complaining of chest pain when she coughed only. She denied fever/chills but endorses general fatigue and overall "not feeling well". Cough is productive with yellow sputum, oxygen  sats where noted to be 91% and she was in resp distress. She was placed on NIV with improvement in resp distress, [facility] was asked to admit.   However after some time she removed her NIV and refused replacement. CCM was consulted for evaluation. Upon my eval. Pt was on [place] 2L with sats 100% but with accessory muscle use and tachypneic. She was rather somnolent but arousable albeit confused and not able to provide history for me. I recommended intubation for pt in light of her exam and refusal for NIV and continued decompensation. Primary physician at this time was able to d/w family and they have declined intubation and agreed to have NIV placed back on.   At this time pt is stable for progressive care. However, should she or her family change their wishes and request intubation and she decompensates, requiring it again; ccm will be happy to assist and transfer pt to ICU.  Pertinent  Medical History  Copd Htn Anxiety Gout Hyperlipidemia GERD T2dm with hyperglycemia   Significant Hospital Events: Including procedures, antibiotic start and stop dates in addition to other pertinent events   Admitted to [facility] 4/18, Ccm consulted for hypotension 4/19 able to come off BiPAP  Interim History / Subjective:  Still feeling some shortness of breath.  Has some  left-sided pain whenever she coughs at sternocostal junction. Was back on bipap yesterday afternoon and evening.   Objective   Blood pressure 111/72, pulse 70, temperature 98.7 F (37.1 C), temperature source Oral, resp. rate 17, height 4\' 11"  (1.499 m), weight 63 kg, SpO2 98%.    Vent Mode: BIPAP;PCV FiO2 (%):  [30 %-40 %] 30 % PEEP:  [5 cmH20] 5 cmH20   Intake/Output Summary (Last 24 hours) at 05/26/2023 0720 Last data filed at 05/26/2023 0630 Gross per 24 hour  Intake 366.49 ml  Output 1050 ml  Net -683.51 ml   Filed Weights   05/23/23 1826  Weight: 63 kg    Examination: General: Chronically ill-appearing woman sitting up in bed no acute distress HENT: [place]/AT, eyes anicteric Lungs: Breathing comfortably on nasal cannula, wheezing bilaterally.  No conversational dyspnea or accessory muscle use.  Occasional coughing. Cardiovascular: S1-S2, regular rate and rhythm Abdomen: Soft, nontender Extremities: No cyanosis or edema Neuro: Awake and alert, answering questions properly, giving all extremities Derm: Warm, dry, no diffuse rashes  AM labs pending  BG 60>300s  Iron  19, TIBC reduced, reduced saturation ratios Ferritin 48  Resolved Hospital Problem list   Lactic acidosis, improved   Assessment & Plan:  Acute exacerbation COPD Acute hypoxic respiratory failure - Continue supplemental oxygen  as required to maintain SpO2 greater than 90%, BiPAP as needed for work of breathing - Continue aggressive steroids-wheezing is improved since yesterday with increasing steroid dose - Continue bronchodilators, profundity pulmonary, DuoNebs as needed - Continue empiric antibiotics-  ceftriaxone  and azithromycin  - Pulmonary hygiene -Continues to require ICU care for as needed BiPAP use  Septic shock due to presumed pneumonia; CXR had not yet demonstrated an infiltrate in the ED - Off norepinephrine  - Complete 5 days of azithromycin  and 7 days of beta-lactam  Costocontral joint  pain, reproducible on exam, very low concern for cardiac etiology -repeat trop to ensure downtrending -Add lidocaine  patch -Acetaminophen  and oxycodone  as needed for uncontrolled pain  AKI on CKD 4; improving -Renally dose meds, avoid nephrotoxic meds - Strict I's/O  Chronic normocytic anemia, IDA -Enteral iron  twice daily with meals - MiraLAX  to prevent constipation -Transfuse for hemoglobin less than 7 or hemodynamically significant bleeding  Uncontrolled hyperglycemia, uncontrolled DM; A1c 8.2. Not in DKA. -increase glargine to 20 units daily with increased steroid dose -Add  insulin  aspart 5 units 3 times daily AC -Sliding scale insulin  as needed -Goal blood glucose 140-180  HTN - Continue to hold PTA losartan  and amlodipine  until blood pressure is higher  Hyperlipidemia - Continue PTA statin  Gout - Continue PTA allopurinol   H/o GERD - Continue PPI twice daily-- PTA regimen. Keep IV until able to take PO due to high dose steroids.  No family bedside today.  Previously members of her church were updated at bedside with her permission on 4/18  Best Practice (right click and "Reselect all SmartList Selections" daily)   Diet/type: Regular consistency (see orders) DVT prophylaxis prophylactic heparin   Pressure ulcer(s): N/A GI prophylaxis: PPI Lines: N/A Foley:  N/A Code Status:  per primary, refusing intubation > need to clarify this further, but she does not require it currently Last date of multidisciplinary goals of care discussion []   Labs   CBC: Recent Labs  Lab 05/23/23 1728 05/24/23 0151 05/24/23 0605 05/25/23 0236  WBC 9.9  --  14.4* 19.8*  NEUTROABS 6.2  --   --   --   HGB 11.3* 11.6* 9.4* 8.9*  HCT 36.6 34.0* 30.7* 28.9*  MCV 92.7  --  93.9 93.5  PLT 296  --  258 232    Basic Metabolic Panel: Recent Labs  Lab 05/23/23 1728 05/24/23 0151 05/24/23 0605 05/24/23 0942 05/25/23 0236  NA 137 135 135 134* 139  K 3.6 3.3* 4.1 4.3 4.9  CL 102   --  101 103 107  CO2 23  --  16* 16* 20*  GLUCOSE 185*  --  615* 539* 205*  BUN 27*  --  31* 30* 29*  CREATININE 2.39*  --  2.70* 2.67* 2.18*  CALCIUM  9.7  --  8.9 9.3 9.5  MG  --   --  2.1  --   --    GFR: Estimated Creatinine Clearance: 18 mL/min (A) (by C-G formula based on SCr of 2.18 mg/dL (H)). Recent Labs  Lab 05/23/23 1728 05/24/23 0435 05/24/23 0605 05/24/23 0942 05/24/23 1251 05/24/23 1412 05/25/23 0236  PROCALCITON  --   --  0.10  --   --   --  0.21  WBC 9.9  --  14.4*  --   --   --  19.8*  LATICACIDVEN  --  7.8*  --  5.4* 4.0* 3.1*  --       Critical care time:       Joesph Mussel, DO 05/26/23 8:43 AM [facility]  For contact information, see Amion. If no response to pager, please call PCCM consult pager. After hours, 7PM- 7AM, please call Elink.

## 2023-05-26 NOTE — Inpatient Diabetes Management (Signed)
 Inpatient Diabetes Program Recommendations  AACE/ADA: New Consensus Statement on Inpatient Glycemic Control  Target Ranges:  Prepandial:   less than 140 mg/dL      Peak postprandial:   less than 180 mg/dL (1-2 hours)      Critically ill patients:  140 - 180 mg/dL    Latest Reference Range & Units 05/26/23 03:36 05/26/23 07:42  Glucose-Capillary 70 - 99 mg/dL 161 (H) 096 (H)    Latest Reference Range & Units 05/25/23 02:34 05/25/23 07:29 05/25/23 11:22 05/25/23 15:07 05/25/23 19:44 05/25/23 23:55  Glucose-Capillary 70 - 99 mg/dL 045 (H) 63 (L) 409 (H) 300 (H) 345 (H) 298 (H)   Review of Glycemic Control  Diabetes history: DM2 Outpatient Diabetes medications: None listed on med list; per office note on 05/01/23 by Dr. Gudena (has meds listed as Trulicity 0.75 mg Qweek and Lantus  5 units at bedtime) Current orders for Inpatient glycemic control:  Semglee  12 units at bedtime, Novolog  0-20 units Q4H; Solumedrol 40 mg Q6H  Inpatient Diabetes Program Recommendations:    Insulin : IF steroids are continued as ordered, may want to consider changing Semglee  to 10 units BID (to start this am). If patient is eating well, consider ordering Novolog  3 units TID with meals for meal coverage if patient eats at least 50% of meals.  NOTE: Noted consult for Diabetes Coordinator. Diabetes Coordinator is not on campus over the weekend but available by pager from 8am to 5pm for questions or concerns.   Thanks, Beacher Limerick, RN, MSN, CDCES Diabetes Coordinator Inpatient Diabetes Program [phone] (Team Pager from 8am to 5pm)

## 2023-05-27 DIAGNOSIS — A419 Sepsis, unspecified organism: Secondary | ICD-10-CM | POA: Diagnosis not present

## 2023-05-27 DIAGNOSIS — J441 Chronic obstructive pulmonary disease with (acute) exacerbation: Secondary | ICD-10-CM | POA: Diagnosis not present

## 2023-05-27 DIAGNOSIS — N179 Acute kidney failure, unspecified: Secondary | ICD-10-CM | POA: Diagnosis not present

## 2023-05-27 DIAGNOSIS — J9601 Acute respiratory failure with hypoxia: Secondary | ICD-10-CM | POA: Diagnosis not present

## 2023-05-27 LAB — BASIC METABOLIC PANEL WITH GFR
Anion gap: 10 (ref 5–15)
BUN: 48 mg/dL — ABNORMAL HIGH (ref 8–23)
CO2: 24 mmol/L (ref 22–32)
Calcium: 10.1 mg/dL (ref 8.9–10.3)
Chloride: 103 mmol/L (ref 98–111)
Creatinine, Ser: 2.23 mg/dL — ABNORMAL HIGH (ref 0.44–1.00)
GFR, Estimated: 22 mL/min — ABNORMAL LOW (ref >60)
Glucose, Bld: 178 mg/dL — ABNORMAL HIGH (ref 70–99)
Potassium: 4.9 mmol/L (ref 3.5–5.1)
Sodium: 137 mmol/L (ref 135–145)

## 2023-05-27 LAB — GLUCOSE, CAPILLARY
Glucose-Capillary: 137 mg/dL — ABNORMAL HIGH (ref 70–99)
Glucose-Capillary: 186 mg/dL — ABNORMAL HIGH (ref 70–99)
Glucose-Capillary: 195 mg/dL — ABNORMAL HIGH (ref 70–99)
Glucose-Capillary: 226 mg/dL — ABNORMAL HIGH (ref 70–99)
Glucose-Capillary: 249 mg/dL — ABNORMAL HIGH (ref 70–99)
Glucose-Capillary: 313 mg/dL — ABNORMAL HIGH (ref 70–99)

## 2023-05-27 MED ORDER — SODIUM ZIRCONIUM CYCLOSILICATE 10 G PO PACK
10.0000 g | PACK | Freq: Once | ORAL | Status: AC
  Administered 2023-05-27: 10 g via ORAL
  Filled 2023-05-27: qty 1

## 2023-05-27 MED ORDER — ORAL CARE MOUTH RINSE: 15.0000 mL | OROMUCOSAL | Status: DC | PRN

## 2023-05-27 MED ORDER — INSULIN ASPART 100 UNIT/ML IJ SOLN
10.0000 [IU] | Freq: Three times a day (TID) | INTRAMUSCULAR | Status: DC
  Administered 2023-05-27 – 2023-05-29 (×6): 10 [IU] via SUBCUTANEOUS

## 2023-05-27 MED ORDER — AMLODIPINE BESYLATE 10 MG PO TABS
10.0000 mg | ORAL_TABLET | Freq: Every day | ORAL | Status: DC
  Administered 2023-05-27 – 2023-05-29 (×3): 10 mg via ORAL
  Filled 2023-05-27 (×3): qty 1

## 2023-05-27 MED ORDER — PANTOPRAZOLE SODIUM 40 MG PO TBEC
40.0000 mg | DELAYED_RELEASE_TABLET | Freq: Two times a day (BID) | ORAL | Status: DC
  Administered 2023-05-27 – 2023-06-02 (×13): 40 mg via ORAL
  Filled 2023-05-27 (×13): qty 1

## 2023-05-27 NOTE — Plan of Care (Signed)
 Problem: Education: Goal: Ability to describe self-care measures that may prevent or decrease complications (Diabetes Survival Skills Education) will improve 05/27/2023 2043 by Lolly Riser, RN Outcome: Progressing 05/27/2023 2023 by Lolly Riser, RN Outcome: Progressing Goal: Individualized Educational Video(s) 05/27/2023 2043 by Lolly Riser, RN Outcome: Progressing 05/27/2023 2023 by Lolly Riser, RN Outcome: Progressing   Problem: Coping: Goal: Ability to adjust to condition or change in health will improve 05/27/2023 2043 by Lolly Riser, RN Outcome: Progressing 05/27/2023 2023 by Lolly Riser, RN Outcome: Progressing   Problem: Fluid Volume: Goal: Ability to maintain a balanced intake and output will improve 05/27/2023 2043 by Lolly Riser, RN Outcome: Progressing 05/27/2023 2023 by Lolly Riser, RN Outcome: Progressing   Problem: Health Behavior/Discharge Planning: Goal: Ability to identify and utilize available resources and services will improve 05/27/2023 2043 by Lolly Riser, RN Outcome: Progressing 05/27/2023 2023 by Lolly Riser, RN Outcome: Progressing Goal: Ability to manage health-related needs will improve 05/27/2023 2043 by Lolly Riser, RN Outcome: Progressing 05/27/2023 2023 by Lolly Riser, RN Outcome: Progressing   Problem: Metabolic: Goal: Ability to maintain appropriate glucose levels will improve 05/27/2023 2043 by Lolly Riser, RN Outcome: Progressing 05/27/2023 2023 by Lolly Riser, RN Outcome: Progressing   Problem: Nutritional: Goal: Maintenance of adequate nutrition will improve 05/27/2023 2043 by Lolly Riser, RN Outcome: Progressing 05/27/2023 2023 by Lolly Riser, RN Outcome: Progressing Goal: Progress toward achieving an optimal weight will improve 05/27/2023 2043 by Lolly Riser, RN Outcome: Progressing 05/27/2023 2023 by Lolly Riser, RN Outcome: Progressing   Problem:  Skin Integrity: Goal: Risk for impaired skin integrity will decrease 05/27/2023 2043 by Lolly Riser, RN Outcome: Progressing 05/27/2023 2023 by Lolly Riser, RN Outcome: Progressing   Problem: Tissue Perfusion: Goal: Adequacy of tissue perfusion will improve 05/27/2023 2043 by Lolly Riser, RN Outcome: Progressing 05/27/2023 2023 by Lolly Riser, RN Outcome: Progressing   Problem: Fluid Volume: Goal: Hemodynamic stability will improve 05/27/2023 2043 by Lolly Riser, RN Outcome: Progressing 05/27/2023 2023 by Lolly Riser, RN Outcome: Progressing   Problem: Clinical Measurements: Goal: Diagnostic test results will improve 05/27/2023 2043 by Lolly Riser, RN Outcome: Progressing 05/27/2023 2023 by Lolly Riser, RN Outcome: Progressing Goal: Signs and symptoms of infection will decrease 05/27/2023 2043 by Lolly Riser, RN Outcome: Progressing 05/27/2023 2023 by Lolly Riser, RN Outcome: Progressing   Problem: Respiratory: Goal: Ability to maintain adequate ventilation will improve 05/27/2023 2043 by Lolly Riser, RN Outcome: Progressing 05/27/2023 2023 by Lolly Riser, RN Outcome: Progressing   Problem: Activity: Goal: Ability to tolerate increased activity will improve 05/27/2023 2043 by Lolly Riser, RN Outcome: Progressing 05/27/2023 2023 by Lolly Riser, RN Outcome: Progressing   Problem: Clinical Measurements: Goal: Ability to maintain a body temperature in the normal range will improve 05/27/2023 2043 by Lolly Riser, RN Outcome: Progressing 05/27/2023 2023 by Lolly Riser, RN Outcome: Progressing   Problem: Respiratory: Goal: Ability to maintain adequate ventilation will improve 05/27/2023 2043 by Lolly Riser, RN Outcome: Progressing 05/27/2023 2023 by Lolly Riser, RN Outcome: Progressing Goal: Ability to maintain a clear airway will improve 05/27/2023 2043 by Lolly Riser, RN Outcome:  Progressing 05/27/2023 2023 by Lolly Riser, RN Outcome: Progressing   Problem: Education: Goal: Knowledge of General Education information will improve Description: Including pain rating scale, medication(s)/side effects and non-pharmacologic comfort measures 05/27/2023  2043 by Lolly Riser, RN Outcome: Progressing 05/27/2023 2023 by Lolly Riser, RN Outcome: Progressing   Problem: Health Behavior/Discharge Planning: Goal: Ability to manage health-related needs will improve 05/27/2023 2043 by Lolly Riser, RN Outcome: Progressing 05/27/2023 2023 by Lolly Riser, RN Outcome: Progressing   Problem: Clinical Measurements: Goal: Ability to maintain clinical measurements within normal limits will improve 05/27/2023 2043 by Lolly Riser, RN Outcome: Progressing 05/27/2023 2023 by Lolly Riser, RN Outcome: Progressing Goal: Will remain free from infection 05/27/2023 2043 by Lolly Riser, RN Outcome: Progressing 05/27/2023 2023 by Lolly Riser, RN Outcome: Progressing Goal: Diagnostic test results will improve 05/27/2023 2043 by Lolly Riser, RN Outcome: Progressing 05/27/2023 2023 by Lolly Riser, RN Outcome: Progressing Goal: Respiratory complications will improve 05/27/2023 2043 by Lolly Riser, RN Outcome: Progressing 05/27/2023 2023 by Lolly Riser, RN Outcome: Progressing Goal: Cardiovascular complication will be avoided 05/27/2023 2043 by Lolly Riser, RN Outcome: Progressing 05/27/2023 2023 by Lolly Riser, RN Outcome: Progressing   Problem: Activity: Goal: Risk for activity intolerance will decrease 05/27/2023 2043 by Lolly Riser, RN Outcome: Progressing 05/27/2023 2023 by Lolly Riser, RN Outcome: Progressing   Problem: Nutrition: Goal: Adequate nutrition will be maintained 05/27/2023 2043 by Lolly Riser, RN Outcome: Progressing 05/27/2023 2023 by Lolly Riser, RN Outcome: Progressing   Problem:  Coping: Goal: Level of anxiety will decrease 05/27/2023 2043 by Lolly Riser, RN Outcome: Progressing 05/27/2023 2023 by Lolly Riser, RN Outcome: Progressing   Problem: Elimination: Goal: Will not experience complications related to bowel motility 05/27/2023 2043 by Lolly Riser, RN Outcome: Progressing 05/27/2023 2023 by Lolly Riser, RN Outcome: Progressing Goal: Will not experience complications related to urinary retention 05/27/2023 2043 by Lolly Riser, RN Outcome: Progressing 05/27/2023 2023 by Lolly Riser, RN Outcome: Progressing   Problem: Pain Managment: Goal: General experience of comfort will improve and/or be controlled 05/27/2023 2043 by Lolly Riser, RN Outcome: Progressing 05/27/2023 2023 by Lolly Riser, RN Outcome: Progressing   Problem: Safety: Goal: Ability to remain free from injury will improve 05/27/2023 2043 by Lolly Riser, RN Outcome: Progressing 05/27/2023 2023 by Lolly Riser, RN Outcome: Progressing   Problem: Skin Integrity: Goal: Risk for impaired skin integrity will decrease 05/27/2023 2043 by Lolly Riser, RN Outcome: Progressing 05/27/2023 2023 by Lolly Riser, RN Outcome: Progressing

## 2023-05-27 NOTE — Plan of Care (Signed)
  Problem: Education: Goal: Ability to describe self-care measures that may prevent or decrease complications (Diabetes Survival Skills Education) will improve Outcome: Progressing Goal: Individualized Educational Video(s) Outcome: Progressing   Problem: Coping: Goal: Ability to adjust to condition or change in health will improve Outcome: Progressing   Problem: Fluid Volume: Goal: Ability to maintain a balanced intake and output will improve Outcome: Progressing   Problem: Health Behavior/Discharge Planning: Goal: Ability to identify and utilize available resources and services will improve Outcome: Progressing Goal: Ability to manage health-related needs will improve Outcome: Progressing   Problem: Metabolic: Goal: Ability to maintain appropriate glucose levels will improve Outcome: Progressing   Problem: Nutritional: Goal: Maintenance of adequate nutrition will improve Outcome: Progressing Goal: Progress toward achieving an optimal weight will improve Outcome: Progressing   Problem: Skin Integrity: Goal: Risk for impaired skin integrity will decrease Outcome: Progressing   Problem: Tissue Perfusion: Goal: Adequacy of tissue perfusion will improve Outcome: Progressing   Problem: Fluid Volume: Goal: Hemodynamic stability will improve Outcome: Progressing   Problem: Clinical Measurements: Goal: Diagnostic test results will improve Outcome: Progressing Goal: Signs and symptoms of infection will decrease Outcome: Progressing   Problem: Respiratory: Goal: Ability to maintain adequate ventilation will improve Outcome: Progressing   Problem: Activity: Goal: Ability to tolerate increased activity will improve Outcome: Progressing   Problem: Clinical Measurements: Goal: Ability to maintain a body temperature in the normal range will improve Outcome: Progressing   Problem: Respiratory: Goal: Ability to maintain adequate ventilation will improve Outcome:  Progressing Goal: Ability to maintain a clear airway will improve Outcome: Progressing   Problem: Education: Goal: Knowledge of General Education information will improve Description: Including pain rating scale, medication(s)/side effects and non-pharmacologic comfort measures Outcome: Progressing   Problem: Health Behavior/Discharge Planning: Goal: Ability to manage health-related needs will improve Outcome: Progressing   Problem: Clinical Measurements: Goal: Ability to maintain clinical measurements within normal limits will improve Outcome: Progressing Goal: Will remain free from infection Outcome: Progressing Goal: Diagnostic test results will improve Outcome: Progressing Goal: Respiratory complications will improve Outcome: Progressing Goal: Cardiovascular complication will be avoided Outcome: Progressing   Problem: Activity: Goal: Risk for activity intolerance will decrease Outcome: Progressing   Problem: Nutrition: Goal: Adequate nutrition will be maintained Outcome: Progressing   Problem: Coping: Goal: Level of anxiety will decrease Outcome: Progressing   Problem: Elimination: Goal: Will not experience complications related to bowel motility Outcome: Progressing Goal: Will not experience complications related to urinary retention Outcome: Progressing   Problem: Pain Managment: Goal: General experience of comfort will improve and/or be controlled Outcome: Progressing   Problem: Safety: Goal: Ability to remain free from injury will improve Outcome: Progressing   Problem: Skin Integrity: Goal: Risk for impaired skin integrity will decrease Outcome: Progressing

## 2023-05-27 NOTE — Progress Notes (Signed)
 NAME:  [name], MRN:  [id number], DOB:  1950/06/21, LOS: 3 ADMISSION DATE:  05/23/2023, CONSULTATION DATE:  05/24/23 REFERRING MD:  EDP, CHIEF COMPLAINT:  aecopd with hypercarbia   History of Present Illness:  72 yo female presented from urgent care with complaints of shortness of breath and cough for 4 days after she was noted to be hypoxic at urgent care. She was negative for flu/covid at clinic. When she presented to [facility] she was wheezing and given duoneb. She was complaining of chest pain when she coughed only. She denied fever/chills but endorses general fatigue and overall "not feeling well". Cough is productive with yellow sputum, oxygen  sats where noted to be 91% and she was in resp distress. She was placed on NIV with improvement in resp distress, [facility] was asked to admit.   However after some time she removed her NIV and refused replacement. CCM was consulted for evaluation. Upon my eval. Pt was on [place] 2L with sats 100% but with accessory muscle use and tachypneic. She was rather somnolent but arousable albeit confused and not able to provide history for me. I recommended intubation for pt in light of her exam and refusal for NIV and continued decompensation. Primary physician at this time was able to d/w family and they have declined intubation and agreed to have NIV placed back on.   At this time pt is stable for progressive care. However, should she or her family change their wishes and request intubation and she decompensates, requiring it again; ccm will be happy to assist and transfer pt to ICU.  Pertinent  Medical History  Copd Htn Anxiety Gout Hyperlipidemia GERD T2dm with hyperglycemia   Significant Hospital Events: Including procedures, antibiotic start and stop dates in addition to other pertinent events   Admitted to [facility] 4/18, Ccm consulted for hypotension 4/19 able to come off BiPAP  Interim History / Subjective:  She denies complaints today. Still somewhat SOB.   Per chart, last time on bipap 4/19.  Objective   Blood pressure 129/83, pulse 84, temperature 98.7 F (37.1 C), temperature source Oral, resp. rate 20, height 4\' 11"  (1.499 m), weight 63 kg, SpO2 100%.        Intake/Output Summary (Last 24 hours) at 05/27/2023 0805 Last data filed at 05/27/2023 1478 Gross per 24 hour  Intake 449.84 ml  Output 1251 ml  Net -801.16 ml   Filed Weights   05/23/23 1826  Weight: 63 kg    Examination: General: elderly woman sitting up in bed in NAD HENT: [place]/AT, eyes anicteric Lungs: breathing comfortably on [place], still wheezing bilaterally. No accessory muscle use.  Cardiovascular: S1S2, RRR Abdomen: obese, soft, NT Extremities: no cyanosis or edema Neuro: awake, alert, moving all extremities, but globally weak. Needs help getting up to sit EOB.  Derm:  warm, dry, no diffuse rashes  K+ 4.9 BUN 48 Cr 2.23  BG 300s> 130s Blood cultures NGTD  Resolved Hospital Problem list   Lactic acidosis, improved   Assessment & Plan:  Acute exacerbation COPD Acute hypoxic respiratory failure - supplemental O2 to maintain SpO2 >90%; BiPAP PRN. Looks like she last used it overnight on 4/19.  - con't aggressive steroids-- stable on BID dosing after initially responding to higher dose - Continue bronchodilatorsbrovana & yupelri , PRN duonebs - complete course of empiric antibiotics- 5 days of azithro, 7 days of Blactam - pulmonary hygiene --can transfer to progressive care since only needing BiPAP QHS in the past few days and not  needing frequent rescue meds  Septic shock due to presumed pneumonia; CXR had not yet demonstrated an infiltrate in the ED; now hypertensive -can restart PTA amlodipine , toprol , losartan  when she is persistently hypertensive - complete course of antibiotics  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 Not in DKA. -con't glargine to 20 units BID with steroids -increase mealtime insulin  aspart to 10 units 3 times daily AC -SSI PRN- resistant scale -goal BG 140-180  HTN -resume PTA amlodipine  -not sure ARB is a good long term med for her anymore with CKD 4  Hyperlipidemia - con't PTA statin  Gout - Continue PTA allopurinol   H/o GERD - con't PTA BID PPI with steroids; switch to oral  She was updated at bedside during rounds. No family has been available, but previously friends have been updated. Transfer to to floor & [facility] to assume care tomorrow. PCCM will continue to follow for COPD AE.  Best Practice (right click and "Reselect all SmartList Selections" daily)   Diet/type: Regular consistency (see orders) DVT prophylaxis prophylactic heparin   Pressure ulcer(s): N/A GI prophylaxis: PPI Lines: N/A Foley:  N/A Code Status:  per primary, refusing intubation > need to clarify this further, but she does not require it currently Last date of multidisciplinary goals of care discussion []   Labs   CBC: Recent Labs  Lab 05/23/23 1728 05/24/23 0151 05/24/23 0605 05/25/23 0236 05/26/23 1558  WBC 9.9  --  14.4* 19.8* 17.1*  NEUTROABS 6.2  --   --   --   --   HGB 11.3* 11.6* 9.4* 8.9* 10.3*  HCT 36.6 34.0* 30.7* 28.9* 33.4*  MCV 92.7  --  93.9 93.5 92.8  PLT 296  --  258 232 259    Basic Metabolic Panel: Recent Labs  Lab 05/24/23 0605 05/24/23 0942 05/25/23 0236 05/26/23 1558 05/27/23 0300  NA 135 134* 139 134* 137  K 4.1 4.3 4.9 5.6* 4.9  CL 101 103 107 102 103  CO2 16* 16* 20* 18* 24  GLUCOSE 615* 539* 205* 369* 178*  BUN 31* 30* 29* 49* 48*  CREATININE 2.70* 2.67* 2.18* 2.40* 2.23*  CALCIUM  8.9 9.3 9.5 9.9  10.1  MG 2.1  --   --   --   --    GFR: Estimated Creatinine Clearance: 17.6 mL/min (A) (by C-G formula based on SCr of 2.23 mg/dL (H)). Recent Labs  Lab 05/23/23 1728 05/24/23 0435 05/24/23 0605 05/24/23 0942 05/24/23 1251 05/24/23 1412 05/25/23 0236 05/26/23 1558  PROCALCITON  --   --  0.10  --   --   --  0.21  --   WBC 9.9  --  14.4*  --   --   --  19.8* 17.1*  LATICACIDVEN  --  7.8*  --  5.4* 4.0* 3.1*  --   --       Critical care time:       Joesph Mussel, DO 05/27/23 1:16 PM [facility]  For contact information, see Amion. If no response to pager, please call PCCM consult pager. After hours, 7PM- 7AM, please call Elink.

## 2023-05-27 NOTE — TOC Initial Note (Signed)
 Transition of Care [facility]) - Initial/Assessment Note    Patient Details  Name: [name] MRN: [id number] Date of Birth: [birth year]  Transition of Care [facility]) CM/SW Contact:    Juliane Och, LCSW Phone Number: 05/27/2023, 11:59 AM  Clinical Narrative:            11:59 AM CSW introduced self and role to patient at bedside. Patient confirmed she resides at home with adult son and adult grandson. Patient stated family could provide transportation and home support upon discharge. Patient confirmed HH history but did not remember agency's name. Patient stated she has a cane, BSC, and rolling walker at home. Patient denied SNF history. CSW followed up on SDOH needs (food). Patient confirmed needs and stated that she has food stamps. CSW offered local list of food pantries. Patient accepted offer, and CSW provided resources.   Expected Discharge Plan: Home w Home Health Services Barriers to Discharge: Continued Medical Work up   Patient Goals and CMS Choice Patient states their goals for this hospitalization and ongoing recovery are:: to return home          Expected Discharge Plan and Services   Discharge Planning Services: CM Consult   Living arrangements for the past 2 months: Single Family Home                                      Prior Living Arrangements/Services Living arrangements for the past 2 months: Single Family Home Lives with:: Adult Children, Relatives Patient language and need for interpreter reviewed:: Yes Do you feel safe going back to the place where you live?: Yes          Current home services: DME Criminal Activity/Legal Involvement Pertinent to Current Situation/Hospitalization: No - Comment as needed  Activities of Daily Living      Permission Sought/Granted Permission sought to share information with : Family Supports, Oceanographer granted to share information with : No (Contact information on  chart)  Share Information with NAME: [facility]  Permission granted to share info w AGENCY: HH  Permission granted to share info w Relationship: Sister  Permission granted to share info w Contact Information: [phone]  Emotional Assessment Appearance:: Appears stated age Attitude/Demeanor/Rapport: Engaged Affect (typically observed): Accepting, Pleasant, Adaptable, Appropriate, Calm, Stable Orientation: : Oriented to Self, Oriented to Place, Oriented to  Time, Oriented to Situation Alcohol / Substance Use: Not Applicable Psych Involvement: No (comment)  Admission diagnosis:  COPD exacerbation (HCC) [J44.1] COPD with acute exacerbation (HCC) [J44.1] Acute on chronic respiratory failure with hypoxia (HCC) [J96.21] Community acquired pneumonia of left lower lobe of lung [J18.9] Patient Active Problem List   Diagnosis Date Noted   Pneumonia of left lower lobe due to infectious organism 05/24/2023   Hypotension 05/24/2023   CKD (chronic kidney disease), stage IV (HCC) 05/24/2023   Acute on chronic respiratory failure with hypoxia (HCC) 05/24/2023   Acute coronary syndrome (HCC) 11/03/2022   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   S/P reverse total shoulder arthroplasty, 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   MGUS (monoclonal gammopathy of unknown significance) 11/29/2020   ESRD (end stage renal disease) (HCC) 03/15/2020   Insulin  dependent type 2 diabetes mellitus (HCC) 12/09/2018   COPD with acute exacerbation (HCC) 12/09/2018   Chronic cholecystitis 11/11/2018   Hemobilia    Dilated bile duct    Jaundice    Elevated LFTs  Choledocholithiasis 10/02/2018   CKD stage 3b, GFR 30-44 ml/min (HCC)    Acute cholecystitis    Elevated liver enzymes 09/18/2018   Calculus of gallbladder and bile duct with acute cholecystitis, with obstruction    Melena    Unstable angina (HCC) 07/12/2018   Port-A-Cath in place 02/27/2018   Cancer of overlapping sites of right breast (HCC) 11/27/2017    Malignant neoplasm of upper-outer quadrant of right breast in female, estrogen receptor positive (HCC) 10/03/2017   Carpal tunnel syndrome, bilateral 11/26/2016   Ulnar neuropathy at elbow, 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   Abdominal pain, epigastric    Acute on chronic renal failure (HCC) 02/19/2015   Diarrhea    Nausea with vomiting    Hyperglycemia 04/01/2014   Hyperosmolar non-ketotic state in patient with type 2 diabetes mellitus (HCC) 04/01/2014   Nausea vomiting and diarrhea 04/01/2014   CAD (coronary artery disease) 04/01/2014   Dehydration    Diabetic ketoacidosis without coma associated with diabetes mellitus due to underlying condition (HCC)    High anion gap metabolic acidosis    Cocaine abuse (HCC)    Toxic encephalopathy-Unlikely metabolic,?psychogenic 08/02/2012   Bereavement 02/02/2012   Depression 02/02/2012   Iron  deficiency anemia, unspecified 11/30/2011   Nonspecific abnormal finding in stool contents 11/30/2011   Benign neoplasm of colon 11/30/2011   Cough 05/14/2011   Acute respiratory failure with hypoxia (HCC) 03/19/2011   Chest pain 03/19/2011   Macrocytic anemia 03/19/2011   Type 2 diabetes mellitus with complication, without long-term current use of insulin  (HCC) 03/19/2011   DOE (dyspnea on exertion) 03/19/2011   Chest pain at rest 02/18/2011    Class: Acute   Cholelithiasis 11/01/2010   DM 04/08/2009   HYPERLIPIDEMIA 04/08/2009   HTN (hypertension) 04/08/2009   Acute myocardial infarction [facility]) 04/08/2009   Coronary atherosclerosis 04/08/2009   GERD 04/08/2009   FATTY LIVER DISEASE 04/08/2009   History of colonic polyps 04/08/2009   PCP:  [facility]:   [facility] - [place], [place] - [address] [place] [address] Phone: [phone] Fax: [phone]     Social Drivers of Health (SDOH) Social History: SDOH Screenings   Food Insecurity: Food Insecurity Present (05/26/2023)  Housing:  Low Risk  (11/03/2022)  Transportation Needs: No Transportation Needs (05/26/2023)  Utilities: Not At Risk (05/26/2023)  Depression (PHQ2-9): Medium Risk (09/22/2021)  Tobacco Use: Medium Risk (05/24/2023)   SDOH Interventions: Food Insecurity Interventions: [facility] Provided, Inpatient TOC   Readmission Risk Interventions     No data to display

## 2023-05-27 NOTE — Evaluation (Addendum)
 Occupational Therapy Evaluation Patient Details Name: [name] MRN: [id number] DOB: July 31, 1950 Today's Date: 05/27/2023   History of Present Illness   Pt is 73 year old presented to [facility] from urgent care on  05/23/23 for SOB. Pt with copd exacerbation and septic shock due to presumed PNA. Pt required bipap.  Pt with costocondral pain. PMH - breast CA, copd, cad, DM, HTN, ckd, lt shoulder replacement, anxiety, gout     Clinical Impressions Pt reports having assist from daughter at baseline for ADLs, and uses cane for mobility. Pt currently needs up to max A for ADLs, and mod A for pivot transfers with RW. Pt with audible wheezing throughout session, VSS. Pt presenting with impairments listed below, will follow acutely. Patient will benefit from continued inpatient follow up therapy, <3 hours/day to maximize safety/ind with ADL/functional mobility pending progression.      If plan is discharge home, recommend the following:   A lot of help with walking and/or transfers;Assistance with cooking/housework;A lot of help with bathing/dressing/bathroom;Assist for transportation;Direct supervision/assist for medications management;Direct supervision/assist for financial management     Functional Status Assessment   Patient has had a recent decline in their functional status and demonstrates the ability to make significant improvements in function in a reasonable and predictable amount of time.     Equipment Recommendations   Tub/shower seat;Other (comment) (RW)     Recommendations for Other Services   PT consult     Precautions/Restrictions   Precautions Precautions: Fall;Other (comment) Precaution/Restrictions Comments: watch SpO2 Restrictions Weight Bearing Restrictions Per Provider Order: No     Mobility Bed Mobility               General bed mobility comments: OOB in chair upon arrival and departure    Transfers Overall transfer level: Needs  assistance Equipment used: Rolling walker (2 wheels) Transfers: Sit to/from Stand, Bed to chair/wheelchair/BSC Sit to Stand: Min assist                  Balance Overall balance assessment: Needs assistance Sitting-balance support: Bilateral upper extremity supported, Feet unsupported Sitting balance-Leahy Scale: Poor     Standing balance support: Bilateral upper extremity supported Standing balance-Leahy Scale: Poor Standing balance comment: reliant on external support                           ADL either performed or assessed with clinical judgement   ADL Overall ADL's : Needs assistance/impaired Eating/Feeding: Supervision/ safety   Grooming: Minimal assistance   Upper Body Bathing: Minimal assistance   Lower Body Bathing: Maximal assistance   Upper Body Dressing : Minimal assistance   Lower Body Dressing: Maximal assistance   Toilet Transfer: Rolling walker (2 wheels);Minimal assistance;Stand-pivot           Functional mobility during ADLs: Rolling walker (2 wheels);Minimal assistance       Vision Baseline Vision/History: 1 Wears glasses Vision Assessment?: No apparent visual deficits     Perception Perception: Not tested       Praxis Praxis: Not tested       Pertinent Vitals/Pain Pain Assessment Pain Assessment: No/denies pain Faces Pain Scale: Hurts even more Pain Location: back Pain Descriptors / Indicators: Grimacing, Guarding Pain Intervention(s): Limited activity within patient's tolerance, Monitored during session, Repositioned     Extremity/Trunk Assessment Upper Extremity Assessment Upper Extremity Assessment: Generalized weakness   Lower Extremity Assessment Lower Extremity Assessment: Defer to PT evaluation   Cervical /  Trunk Assessment Cervical / Trunk Assessment: Normal   Communication Communication Communication: Impaired Factors Affecting Communication: Hearing impaired;Reduced clarity of speech (mouthes  words, hoarse voice)   Cognition Arousal: Alert Behavior During Therapy: WFL for tasks assessed/performed Cognition: No apparent impairments                               Following commands: Intact       Cueing  General Comments   Cueing Techniques: Verbal cues;Tactile cues  VSS on supplemental O2   Exercises     Shoulder Instructions      Home Living Family/patient expects to be discharged to:: Private residence Living Arrangements: Children;Other relatives Available Help at Discharge: Family;Available 24 hours/day Type of Home: House Home Access: Stairs to enter [employer] of Steps: 4 Entrance Stairs-Rails: Right Home Layout: One level     Bathroom Shower/Tub: Producer, television/film/video: Standard Bathroom Accessibility: Yes              Prior Functioning/Environment Prior Level of Function : Independent/Modified Independent             Mobility Comments: Using quad cane ADLs Comments: family provides transportation, uses roller carts in grocery store, daughter hellps with shower    OT Problem List: Decreased strength;Decreased range of motion;Decreased activity tolerance;Impaired balance (sitting and/or standing);Decreased coordination;Cardiopulmonary status limiting activity   OT Treatment/Interventions: Self-care/ADL training;Therapeutic exercise;Energy conservation;DME and/or AE instruction;Therapeutic activities;Patient/family education;Visual/perceptual remediation/compensation      OT Goals(Current goals can be found in the care plan section)   Acute Rehab OT Goals Patient Stated Goal: none stated OT Goal Formulation: With patient Time For Goal Achievement: 06/10/23 Potential to Achieve Goals: Good ADL Goals Pt Will Perform Upper Body Dressing: with supervision;sitting Pt Will Perform Lower Body Dressing: with supervision;sit to/from stand;sitting/lateral leans Pt Will Transfer to Toilet: with  supervision;ambulating Additional ADL Goal #1: pt will perform bed mobility min A in prep for ADLs   OT Frequency:  Min 2X/week    Co-evaluation              AM-PAC OT "6 Clicks" Daily Activity     Outcome Measure Help from another person eating meals?: A Little Help from another person taking care of personal grooming?: A Little Help from another person toileting, which includes using toliet, bedpan, or urinal?: A Lot Help from another person bathing (including washing, rinsing, drying)?: A Lot Help from another person to put on and taking off regular upper body clothing?: A Little Help from another person to put on and taking off regular lower body clothing?: A Lot 6 Click Score: 15   End of Session Equipment Utilized During Treatment: Gait belt Nurse Communication: Mobility status  Activity Tolerance: Patient tolerated treatment well Patient left: with call bell/phone within reach;in chair;with chair alarm set  OT Visit Diagnosis: Unsteadiness on feet (R26.81);Other abnormalities of gait and mobility (R26.89);Muscle weakness (generalized) (M62.81)                Time: 1610-9604 OT Time Calculation (min): 18 min Charges:  OT General Charges $OT Visit: 1 Visit OT Evaluation $OT Eval Moderate Complexity: 1 Mod  Okechukwu Regnier K, OTD, OTR/L SecureChat Preferred Acute Rehab [phone]   Benedict Brain Koonce 05/27/2023, 12:03 PM

## 2023-05-28 DIAGNOSIS — E1165 Type 2 diabetes mellitus with hyperglycemia: Secondary | ICD-10-CM | POA: Diagnosis not present

## 2023-05-28 DIAGNOSIS — J189 Pneumonia, unspecified organism: Secondary | ICD-10-CM | POA: Diagnosis not present

## 2023-05-28 DIAGNOSIS — Z794 Long term (current) use of insulin: Secondary | ICD-10-CM | POA: Diagnosis not present

## 2023-05-28 DIAGNOSIS — J9601 Acute respiratory failure with hypoxia: Secondary | ICD-10-CM | POA: Diagnosis not present

## 2023-05-28 DIAGNOSIS — J441 Chronic obstructive pulmonary disease with (acute) exacerbation: Secondary | ICD-10-CM | POA: Diagnosis not present

## 2023-05-28 DIAGNOSIS — A419 Sepsis, unspecified organism: Secondary | ICD-10-CM | POA: Diagnosis not present

## 2023-05-28 LAB — BASIC METABOLIC PANEL WITH GFR
Anion gap: 13 (ref 5–15)
BUN: 56 mg/dL — ABNORMAL HIGH (ref 8–23)
CO2: 23 mmol/L (ref 22–32)
Calcium: 9.8 mg/dL (ref 8.9–10.3)
Chloride: 99 mmol/L (ref 98–111)
Creatinine, Ser: 2.43 mg/dL — ABNORMAL HIGH (ref 0.44–1.00)
GFR, Estimated: 20 mL/min — ABNORMAL LOW (ref >60)
Glucose, Bld: 218 mg/dL — ABNORMAL HIGH (ref 70–99)
Potassium: 5 mmol/L (ref 3.5–5.1)
Sodium: 135 mmol/L (ref 135–145)

## 2023-05-28 LAB — CBC
HCT: 36.7 % (ref 36.0–46.0)
Hemoglobin: 11.5 g/dL — ABNORMAL LOW (ref 12.0–15.0)
MCH: 28.3 pg (ref 26.0–34.0)
MCHC: 31.3 g/dL (ref 30.0–36.0)
MCV: 90.2 fL (ref 80.0–100.0)
Platelets: 285 10*3/uL (ref 150–400)
RBC: 4.07 MIL/uL (ref 3.87–5.11)
RDW: 14.2 % (ref 11.5–15.5)
WBC: 14.1 10*3/uL — ABNORMAL HIGH (ref 4.0–10.5)
nRBC: 0.7 % — ABNORMAL HIGH (ref 0.0–0.2)

## 2023-05-28 LAB — GLUCOSE, CAPILLARY
Glucose-Capillary: 199 mg/dL — ABNORMAL HIGH (ref 70–99)
Glucose-Capillary: 190 mg/dL — ABNORMAL HIGH (ref 70–99)
Glucose-Capillary: 204 mg/dL — ABNORMAL HIGH (ref 70–99)
Glucose-Capillary: 330 mg/dL — ABNORMAL HIGH (ref 70–99)
Glucose-Capillary: 415 mg/dL — ABNORMAL HIGH (ref 70–99)
Glucose-Capillary: 422 mg/dL — ABNORMAL HIGH (ref 70–99)
Glucose-Capillary: 276 mg/dL — ABNORMAL HIGH (ref 70–99)

## 2023-05-28 MED ORDER — METHYLPREDNISOLONE SODIUM SUCC 125 MG IJ SOLR
60.0000 mg | Freq: Two times a day (BID) | INTRAMUSCULAR | Status: DC
  Administered 2023-05-28 – 2023-05-29 (×2): 60 mg via INTRAVENOUS
  Filled 2023-05-28 (×2): qty 2

## 2023-05-28 MED ORDER — INSULIN GLARGINE-YFGN 100 UNIT/ML ~~LOC~~ SOLN
25.0000 [IU] | Freq: Two times a day (BID) | SUBCUTANEOUS | Status: DC
  Administered 2023-05-28 – 2023-05-29 (×2): 25 [IU] via SUBCUTANEOUS
  Filled 2023-05-28 (×5): qty 0.25

## 2023-05-28 MED ORDER — BUDESONIDE 0.5 MG/2ML IN SUSP
0.5000 mg | Freq: Two times a day (BID) | RESPIRATORY_TRACT | Status: DC
  Administered 2023-05-28 – 2023-05-31 (×6): 0.5 mg via RESPIRATORY_TRACT
  Filled 2023-05-28 (×6): qty 2

## 2023-05-28 NOTE — Progress Notes (Addendum)
 PROGRESS NOTE                                                                                                                                                                                                             Patient Demographics:    [name], is a 73 y.o. female, DOB - 1951/02/05, EAV:[id number]  Outpatient Primary MD for the patient is [facility]    LOS - 4  Admit date - 05/23/2023    Chief Complaint  Patient presents with   Shortness of Breath       Brief Narrative (HPI from H&P)    73 yo female with PMH of COPD, T2DM, HTN, anxiety, gout, HLD, GERD who presented from urgent care with complaints of shortness of breath and cough for 4 days after she was noted to be hypoxic at urgent care. She was negative for flu/covid at clinic. When she presented to [facility] she was wheezing and given duoneb. She was complaining of chest pain when she coughed only. She denied fever/chills but endorses general fatigue and overall "not feeling well". Cough is productive with yellow sputum, oxygen  sats where noted to be 91% and she was in resp distress. She was placed on NIV with improvement in resp distress, TRH was asked to admit.    However after some time she removed her NIV and refused replacement. CCM was consulted for evaluation. Upon my eval. Pt was on  2L with sats 100% but with accessory muscle use and tachypneic. She was rather somnolent but arousable albeit confused and not able to provide history for me. I recommended intubation for pt in light of her exam and refusal for NIV and continued decompensation. Primary physician at this time was able to d/w family and they have declined intubation and agreed to have NIV placed back on.               Patient transferred to progressive bed and TRH assumed care on 4/22   Subjective:    [name] was evaluated at the bed side.  Patient sitting in chair on  room air.  States her breathing is much better.  She just worked with physical therapy in the morning.   Assessment  & Plan :    Assessment and Plan: # Acute hypoxic respiratory failure # COPD exacerbation -  Off on BiPAP currently on room air - Continues to have expiratory wheezing - Continue Solu-Medrol   - Continue bronchodilators, as needed DuoNebs - Incentive spirometer, flutter valve  # Community-acquired pneumonia - Off supplemental O2, respiratory status stable - Finished course of Rocephin  - Completed 4 days of azithromycin   # Septic shock, resolved - Presumed secondary to pneumonia - BP stable with SBP in the 110s to 140s  # Type 2 diabetes - Recent A1c of 8.2% - CBGs significantly elevated to 200s to 300s in the setting of steroid use - Semglee  increased to 20 units twice daily - NovoLog  increased to 10 units 3 times daily with meals - SSI with meals, CBG monitoring  # AKI on CKD 4 - Improving, creatinine of 2.43 - Avoid nephrotoxic agents - Trend renal function  # HTN - SBP now in the 110s to 140s - Continue amlodipine  and Toprol -XL  # Gout - Continue allopurinol   # HLD - Continue atorvastatin    # Anxiety - Continue on duloxetine  and Atarax   # GERD - Continue Protonix   # Generalized weakness - In the setting of acute illness - PT/OT eval and treat      Condition -stable  Family Communication  : No family at bedside  Code Status : Full  Consults  : PCCM  PUD Prophylaxis : Protonix    Procedures  :     None      Disposition Plan  :    Status is: Inpatient Remains inpatient appropriate because: COPD exacerbation  DVT Prophylaxis  :    heparin  injection 5,000 Units Start: 05/24/23 0600     Lab Results  Component Value Date   PLT 259 05/26/2023    Diet :  Diet Order             Diet Carb Modified Fluid consistency: Thin; Room service appropriate? Yes  Diet effective now                    Inpatient  Medications  Scheduled Meds:  allopurinol   100 mg Oral Daily   amLODipine   10 mg Oral Daily   anastrozole   1 mg Oral Daily   arformoterol   15 mcg Nebulization BID   aspirin  EC  81 mg Oral Daily   atorvastatin   20 mg Oral Daily   budesonide  (PULMICORT ) nebulizer solution  0.5 mg Nebulization BID   Chlorhexidine  Gluconate Cloth  6 each Topical Daily   DULoxetine   30 mg Oral Daily   feeding supplement  237 mL Oral BID BM   ferrous sulfate   325 mg Oral BID WC   gabapentin   100 mg Oral BID   And   gabapentin   200 mg Oral QHS   heparin   5,000 Units Subcutaneous Q8H   hydrOXYzine   50 mg Oral QHS   insulin  aspart  0-20 Units Subcutaneous Q4H   insulin  aspart  10 Units Subcutaneous TID WC   insulin  glargine-yfgn  20 Units Subcutaneous BID   lidocaine   1 patch Transdermal Q24H   methylPREDNISolone  (SOLU-MEDROL ) injection  60 mg Intravenous Q12H   pantoprazole   40 mg Oral BID   polyethylene glycol  17 g Oral Daily   revefenacin   175 mcg Nebulization Daily   sodium chloride  flush  3 mL Intravenous Q12H   Continuous Infusions:  cefTRIAXone  (ROCEPHIN )  IV Stopped (05/27/23 2130)   PRN Meds:.acetaminophen  **OR** acetaminophen , HYDROmorphone  (DILAUDID ) injection, ipratropium-albuterol , methocarbamol , ondansetron  **OR** ondansetron  (ZOFRAN ) IV, mouth rinse, oxyCODONE , senna  Antibiotics  :  Anti-infectives (From admission, onward)    Start     Dose/Rate Route Frequency Ordered Stop   05/24/23 2100  azithromycin  (ZITHROMAX ) 500 mg in sodium chloride  0.9 % 250 mL IVPB        500 mg 250 mL/hr over 60 Minutes Intravenous Every 24 hours 05/24/23 0431 05/27/23 2300   05/24/23 2000  cefTRIAXone  (ROCEPHIN ) 2 g in sodium chloride  0.9 % 100 mL IVPB        2 g 200 mL/hr over 30 Minutes Intravenous Every 24 hours 05/24/23 0431 05/30/23 1959   05/23/23 2030  cefTRIAXone  (ROCEPHIN ) 1 g in sodium chloride  0.9 % 100 mL IVPB        1 g 200 mL/hr over 30 Minutes Intravenous  Once 05/23/23 2019 05/23/23  2110   05/23/23 2030  azithromycin  (ZITHROMAX ) 500 mg in sodium chloride  0.9 % 250 mL IVPB        500 mg 250 mL/hr over 60 Minutes Intravenous  Once 05/23/23 2019 05/23/23 2216         Objective:   Vitals:   05/28/23 0500 05/28/23 0600 05/28/23 0732 05/28/23 0816  BP:      Pulse: 60 (!) 59  80  Resp: 19 18  20   Temp:   98 F (36.7 C)   TempSrc:   Oral   SpO2: 95% 94%    Weight:      Height:        Wt Readings from Last 3 Encounters:  05/23/23 63 kg  05/01/23 62.7 kg  03/09/23 58.1 kg     Intake/Output Summary (Last 24 hours) at 05/28/2023 1610 Last data filed at 05/27/2023 2300 Gross per 24 hour  Intake 830 ml  Output 300 ml  Net 530 ml     Physical Exam  General: Pleasant, well-appearing elderly woman sitting in chair. No acute distress. HEENT: [place]/AT. Anicteric sclera CV: RRR. No murmurs, rubs, or gallops. No LE edema Pulmonary: Lungs CTAB. Normal effort. Mild expiratory wheezing in the upper lung fields.  Scattered rhonchi. Abdominal: Soft, NT/ND. Normal bowel sounds. Extremities: Palpable radial and DP pulses. Normal ROM. Skin: Warm and dry. No obvious rash or lesions. Neuro: A&Ox3. Moves all extremities. Normal sensation to light touch. No focal deficit.    RN pressure injury documentation:      Data Review:    Recent Labs  Lab 05/23/23 1728 05/24/23 0151 05/24/23 0605 05/25/23 0236 05/26/23 1558  WBC 9.9  --  14.4* 19.8* 17.1*  HGB 11.3* 11.6* 9.4* 8.9* 10.3*  HCT 36.6 34.0* 30.7* 28.9* 33.4*  PLT 296  --  258 232 259  MCV 92.7  --  93.9 93.5 92.8  MCH 28.6  --  28.7 28.8 28.6  MCHC 30.9  --  30.6 30.8 30.8  RDW 14.4  --  14.6 14.6 14.5  LYMPHSABS 2.3  --   --   --   --   MONOABS 0.9  --   --   --   --   EOSABS 0.4  --   --   --   --   BASOSABS 0.1  --   --   --   --     Recent Labs  Lab 05/23/23 1728 05/24/23 0151 05/24/23 0435 05/24/23 0605 05/24/23 0942 05/24/23 1251 05/24/23 1412 05/25/23 0236 05/26/23 1558 05/27/23 0300   NA 137   < >  --  135 134*  --   --  139 134* 137  K 3.6   < >  --  4.1 4.3  --   --  4.9 5.6* 4.9  CL 102  --   --  101 103  --   --  107 102 103  CO2 23  --   --  16* 16*  --   --  20* 18* 24  ANIONGAP 12  --   --  18* 15  --   --  12 14 10   GLUCOSE 185*  --   --  615* 539*  --   --  205* 369* 178*  BUN 27*  --   --  31* 30*  --   --  29* 49* 48*  CREATININE 2.39*  --   --  2.70* 2.67*  --   --  2.18* 2.40* 2.23*  AST  --   --   --  21  --   --   --  14* 20  --   ALT  --   --   --  10  --   --   --  10 12  --   ALKPHOS  --   --   --  118  --   --   --  86 94  --   BILITOT  --   --   --  0.5  --   --   --  0.4 0.5  --   ALBUMIN   --   --   --  3.2*  --   --   --  3.3* 3.4*  --   PROCALCITON  --   --   --  0.10  --   --   --  0.21  --   --   LATICACIDVEN  --   --  7.8*  --  5.4* 4.0* 3.1*  --   --   --   HGBA1C  --   --   --  8.2*  --   --   --   --   --   --   BNP 21.5  --   --   --   --   --   --   --   --   --   MG  --   --   --  2.1  --   --   --   --   --   --   CALCIUM  9.7  --   --  8.9 9.3  --   --  9.5 9.9 10.1   < > = values in this interval not displayed.      Recent Labs  Lab 05/23/23 1728 05/24/23 0435 05/24/23 0605 05/24/23 0942 05/24/23 1251 05/24/23 1412 05/25/23 0236 05/26/23 1558 05/27/23 0300  PROCALCITON  --   --  0.10  --   --   --  0.21  --   --   LATICACIDVEN  --  7.8*  --  5.4* 4.0* 3.1*  --   --   --   HGBA1C  --   --  8.2*  --   --   --   --   --   --   BNP 21.5  --   --   --   --   --   --   --   --   MG  --   --  2.1  --   --   --   --   --   --   CALCIUM  9.7  --  8.9 9.3  --   --  9.5 9.9 10.1    --------------------------------------------------------------------------------------------------------------- Lab Results  Component Value Date   CHOL 132 11/03/2022   HDL 50 11/03/2022   LDLCALC 72 11/03/2022   TRIG 50 11/03/2022   CHOLHDL 2.6 11/03/2022    Lab Results  Component Value Date   HGBA1C 8.2 (H) 05/24/2023   No results for  input(s): "TSH", "T4TOTAL", "FREET4", "T3FREE", "THYROIDAB" in the last 72 hours. No results for input(s): "VITAMINB12", "FOLATE", "FERRITIN", "TIBC", "IRON ", "RETICCTPCT" in the last 72 hours. ------------------------------------------------------------------------------------------------------------------ Cardiac Enzymes No results for input(s): "CKMB", "TROPONINI", "MYOGLOBIN" in the last 168 hours.  Invalid input(s): "CK"  Micro Results Recent Results (from the past 240 hours)  Respiratory (~20 pathogens) panel by PCR     Status: None   Collection Time: 05/24/23  4:27 AM   Specimen: Nasopharyngeal Swab; Respiratory  Result Value Ref Range Status   Adenovirus NOT DETECTED NOT DETECTED Final   Coronavirus 229E NOT DETECTED NOT DETECTED Final    Comment: (NOTE) The Coronavirus on the Respiratory Panel, DOES NOT test for the novel  Coronavirus (2019 nCoV)    Coronavirus HKU1 NOT DETECTED NOT DETECTED Final   Coronavirus NL63 NOT DETECTED NOT DETECTED Final   Coronavirus OC43 NOT DETECTED NOT DETECTED Final   Metapneumovirus NOT DETECTED NOT DETECTED Final   Rhinovirus / Enterovirus NOT DETECTED NOT DETECTED Final   Influenza A NOT DETECTED NOT DETECTED Final   Influenza B NOT DETECTED NOT DETECTED Final   Parainfluenza Virus 1 NOT DETECTED NOT DETECTED Final   Parainfluenza Virus 2 NOT DETECTED NOT DETECTED Final   Parainfluenza Virus 3 NOT DETECTED NOT DETECTED Final   Parainfluenza Virus 4 NOT DETECTED NOT DETECTED Final   Respiratory Syncytial Virus NOT DETECTED NOT DETECTED Final   Bordetella pertussis NOT DETECTED NOT DETECTED Final   Bordetella Parapertussis NOT DETECTED NOT DETECTED Final   Chlamydophila pneumoniae NOT DETECTED NOT DETECTED Final   Mycoplasma pneumoniae NOT DETECTED NOT DETECTED Final    Comment: Performed at [facility], [address] [place], [place] [address]  SARS Coronavirus 2 by RT PCR (hospital order, performed in [facility] hospital lab)  *cepheid single result test* Anterior Nasal Swab     Status: None   Collection Time: 05/24/23  4:27 AM   Specimen: Anterior Nasal Swab  Result Value Ref Range Status   SARS Coronavirus 2 by RT PCR NEGATIVE NEGATIVE Final    Comment: Performed at [facility], [address] [place], [place] [address]  Culture, blood (Routine X 2) w Reflex to ID Panel     Status: None (Preliminary result)   Collection Time: 05/24/23  6:05 AM   Specimen: BLOOD  Result Value Ref Range Status   Specimen Description BLOOD BLOOD LEFT HAND  Final   Special Requests   Final    BOTTLES DRAWN AEROBIC AND ANAEROBIC Blood Culture adequate volume   Culture   Final    NO GROWTH 3 DAYS Performed at [facility], [address], [place], [place] [address]    Report Status PENDING  Incomplete  Culture, blood (Routine X 2) w Reflex to ID Panel     Status: None (Preliminary result)   Collection Time: 05/24/23  6:05 AM   Specimen: BLOOD  Result Value Ref Range Status   Specimen Description BLOOD BLOOD LEFT HAND  Final   Special Requests   Final    BOTTLES DRAWN AEROBIC AND ANAEROBIC Blood Culture adequate volume   Culture  Final    NO GROWTH 3 DAYS Performed at [facility], [address], [place], [place] [address]    Report Status PENDING  Incomplete  MRSA Next Gen by PCR, Nasal     Status: None   Collection Time: 05/24/23 12:25 PM   Specimen: Nasal Mucosa; Nasal Swab  Result Value Ref Range Status   MRSA by PCR Next Gen NOT DETECTED NOT DETECTED Final    Comment: (NOTE) The GeneXpert MRSA Assay (FDA approved for NASAL specimens only), is one component of a comprehensive MRSA colonization surveillance program. It is not intended to diagnose MRSA infection nor to guide or monitor treatment for MRSA infections. Test performance is not FDA approved in patients less than 33 years old. Performed at [facility], [address] [place], [place] [address]     Radiology Reports No  results found.    Signature  -   Vita Grip M.D on 05/28/2023 at 8:42 AM   -  To page go to [email or web address]

## 2023-05-28 NOTE — Progress Notes (Signed)
 Physical Therapy Treatment Patient Details Name: [name] MRN: [id number] DOB: [birth year] Today's Date: 05/28/2023   History of Present Illness Pt is 73 year old presented to [facility] from urgent care on  05/23/23 for SOB. Pt with copd exacerbation and septic shock due to presumed PNA. Pt required bipap.  Pt with costocondral pain. PMH - breast CA, copd, cad, DM, HTN, ckd, lt shoulder replacement, anxiety, gout.    PT Comments  Pt received in recliner after lab techs finished, pt agreeable to additional session to assess for O2 needs with activity and gait progression, pt denies fatigue after seated LE exercises. Pt needing up to minA for gait with RW and transfer training, but progressed to CGA after reciprocal transfer practice from chair. Pt needing 2L O2 [place] for gait training after hypoxia on RA with standing initially. Pt continues to benefit from PT services to progress toward functional mobility goals.    If plan is discharge home, recommend the following: A lot of help with bathing/dressing/bathroom;Assist for transportation;Help with stairs or ramp for entrance;A little help with walking and/or transfers;Assistance with cooking/housework   Can travel by Pension scheme manager (4 wheels) (pt reports her rollator is >13 years old and is not safe to use)    Recommendations for Other Services       Precautions / Restrictions Precautions Precautions: Fall;Other (comment) Precaution/Restrictions Comments: watch SpO2 Restrictions Weight Bearing Restrictions Per Provider Order: No     Mobility  Bed Mobility Overal bed mobility: Needs Assistance             General bed mobility comments: OOB in chair upon arrival and departure    Transfers Overall transfer level: Needs assistance Equipment used: Rolling walker (2 wheels) Transfers: Sit to/from Stand, Bed to chair/wheelchair/BSC Sit to Stand: Contact guard assist, Min assist            General transfer comment: minA to stand from chair height initially with cues to push from arm rests but only minimal push before she reached for RW handles;  then on second trial, CGA after RW moved further away to encourage her to not reach up to pull up on AD.    Ambulation/Gait Ambulation/Gait assistance: Contact guard assist, Min assist Gait Distance (Feet): 40 Feet Assistive device: Rolling walker (2 wheels) (bari RW was only device available at time of session) Gait Pattern/deviations: Step-through pattern, Drifts right/left, Decreased stride length   Gait velocity interpretation: <1.31 ft/sec, indicative of household ambulator   General Gait Details: Low foot clearance, short step length, mostly CGA for forward stepping with occasional assist to manage larger weight bari RW, but once turning, pt with R loss of balance x1 requiring minA from PTA to correct; needed 2L O2 [place] to maintain SpO2 90% and greater wtih exertion.   Stairs             Wheelchair Mobility     Tilt Bed    Modified Rankin (Stroke Patients Only)       Balance Overall balance assessment: Needs assistance Sitting-balance support: No upper extremity supported, Feet supported Sitting balance-Leahy Scale: Fair Sitting balance - Comments: sitting forward in chair, tends to use hands but able to sit w/o using hands   Standing balance support: Bilateral upper extremity supported Standing balance-Leahy Scale: Poor Standing balance comment: reliant on external support  Communication Communication Communication: Impaired Factors Affecting Communication: Hearing impaired;Reduced clarity of speech (mouthes words, hoarse voice)  Cognition Arousal: Alert Behavior During Therapy: WFL for tasks assessed/performed   PT - Cognitive impairments: No family/caregiver present to determine baseline                       PT - Cognition Comments: Slow  processing Following commands: Intact      Cueing Cueing Techniques: Verbal cues, Tactile cues  Exercises Other Exercises Other Exercises: seated BLE AROM: LAQ, hip flexion ankle pumps/circles x10-15 reps ea Other Exercises: chair "crunches" x3 reps using BUE to pull forward to long sitting posture Other Exercises: further exercise deferred due to arrival of lab techs needing increased time    General Comments General comments (skin integrity, edema, etc.): SBP 115 standing, no c/o dizziness; SpO2 desat on RA with standing to 82%-85% and does not improve with cues for pursed-lip breathing; 2L O2 [place] placed and SpO2 remains 88% and greater with standing activity; weaned back to RA once back in recliner.      Pertinent Vitals/Pain Pain Assessment Pain Assessment: Faces Faces Pain Scale: Hurts a little bit Pain Location: back and throat/ribs Pain Descriptors / Indicators: Grimacing, Guarding, Sore Pain Intervention(s): Monitored during session, Repositioned, Premedicated before session    Home Living                          Prior Function            PT Goals (current goals can now be found in the care plan section) Acute Rehab PT Goals Patient Stated Goal: To go home PT Goal Formulation: With patient Time For Goal Achievement: 06/09/23 Progress towards PT goals: Progressing toward goals    Frequency    Min 2X/week      PT Plan      Co-evaluation              AM-PAC PT "6 Clicks" Mobility   Outcome Measure  Help needed turning from your back to your side while in a flat bed without using bedrails?: A Little Help needed moving from lying on your back to sitting on the side of a flat bed without using bedrails?: A Lot Help needed moving to and from a bed to a chair (including a wheelchair)?: A Little Help needed standing up from a chair using your arms (e.g., wheelchair or bedside chair)?: A Little Help needed to walk in hospital room?: A Little Help  needed climbing 3-5 steps with a railing? : A Lot 6 Click Score: 16    End of Session Equipment Utilized During Treatment: Gait belt;Oxygen  (pt given gait belt for home) Activity Tolerance: Patient tolerated treatment well Patient left: in chair;with call bell/phone within reach;with chair alarm set (recliner) Nurse Communication: Mobility status;Other (comment) (O2 needs) PT Visit Diagnosis: Unsteadiness on feet (R26.81);Other abnormalities of gait and mobility (R26.89);Muscle weakness (generalized) (M62.81);Pain Pain - part of body:  (L lower back and L anterior ribs (RN aware))     Time: 1610-9604 PT Time Calculation (min) (ACUTE ONLY): 23 min  Charges:    $Gait Training: 8-22 mins PT General Charges $$ ACUTE PT VISIT: 1 Visit                     Dominique Carollo P., PTA Acute Rehabilitation Services Secure Chat Preferred 9a-5:30pm Office: [phone]    [name] [facility] 05/28/2023, 11:38 AM

## 2023-05-28 NOTE — Progress Notes (Signed)
 NAME:  [name], MRN:  [id number], DOB:  23-Dec-1950, LOS: 4 ADMISSION DATE:  05/23/2023, CONSULTATION DATE:  05/24/23 REFERRING MD:  EDP, CHIEF COMPLAINT:  aecopd with hypercarbia   History of Present Illness:  73 yo female presented from urgent care with complaints of shortness of breath and cough for 4 days after she was noted to be hypoxic at urgent care. She was negative for flu/covid at clinic. When she presented to [facility] she was wheezing and given duoneb. She was complaining of chest pain when she coughed only. She denied fever/chills but endorses general fatigue and overall "not feeling well". Cough is productive with yellow sputum, oxygen  sats where noted to be 91% and she was in resp distress. She was placed on NIV with improvement in resp distress, TRH was asked to admit.   However after some time she removed her NIV and refused replacement. CCM was consulted for evaluation. Upon my eval. Pt was on [place] 2L with sats 100% but with accessory muscle use and tachypneic. She was rather somnolent but arousable albeit confused and not able to provide history for me. I recommended intubation for pt in light of her exam and refusal for NIV and continued decompensation. Primary physician at this time was able to d/w family and they have declined intubation and agreed to have NIV placed back on.    Pertinent  Medical History   Htn Anxiety Gout Hyperlipidemia GERD T2dm with hyperglycemia   Significant Hospital Events: Including procedures, antibiotic start and stop dates in addition to other pertinent events   Admitted to [facility] 4/18, Ccm consulted for hypotension 4/19 able to come off BiPAP  Interim History / Subjective:   Improving shortness of breath Did not need BiPAP overnight On room air  Objective   Blood pressure 132/67, pulse 81, temperature 98 F (36.7 C), temperature source Oral, resp. rate 16, height 4\' 11"  (1.499 m), weight 63 kg, SpO2 92%.    FiO2 (%):  [40 %] 40 %    Intake/Output Summary (Last 24 hours) at 05/28/2023 7829 Last data filed at 05/27/2023 2300 Gross per 24 hour  Intake 830 ml  Output 300 ml  Net 530 ml   Filed Weights   05/23/23 1826  Weight: 63 kg    Examination: General: elderly woman sitting up in bed in NAD HENT: /AT, eyes anicteric Lungs: No accessory muscle use, bilateral scattered rhonchi Cardiovascular: S1S2, RRR Abdomen: obese, soft, NT Extremities: no cyanosis or edema Neuro: awake, alert, nonfocal, appears weak Derm:  warm, dry, no diffuse rashes  Labs show normal electrolytes, improved creatinine to 2.2, BUN 48 Sugars peaked at 313, 190s currently  Resolved Hospital Problem list   Lactic acidosis, improved   Assessment & Plan:  Previous evaluation in 2013 and 2019 including PFTs did not show COPD Acute hypoxic respiratory failure, resolved, off BiPAP - supplemental O2 to maintain SpO2 >90%;  -Decrease Solu-Medrol  60 every 12 - Continue budesonide , brovana  & yupelri , PRN duonebs - pulmonary hygiene   Septic shock due to presumed pneumonia; CXR had not yet demonstrated an infiltrate in the ED; now hypertensive ,RVP negative.   - complete course of empiric antibiotics- 5 days of azithro, 7 days of ceftriaxone    AKI on CKD 4; improving -strict I/O -renally dose meds, avoid nephrotoxic meds   Uncontrolled hyperglycemia, uncontrolled DM; A1c 8.2. Not in DKA. -con't glargine to 20 units BID with steroids -increase mealtime insulin  aspart to 10 units 3 times daily AC -SSI PRN- resistant  scale -goal BG 140-180  HTN -resume PTA amlodipine , toprol  -not sure ARB is a good long term med for her anymore with CKD 4      Transfer to to floor & TRH to assume care tomorrow. PCCM will be available as needed  Best Practice (right click and "Reselect all SmartList Selections" daily)   Diet/type: Regular consistency (see orders) DVT prophylaxis prophylactic heparin   Pressure ulcer(s): N/A GI prophylaxis:  PPI Lines: N/A Foley:  N/A Code Status:  per primary, refusing intubation > need to clarify this further, but she does not require it currently Last date of multidisciplinary goals of care discussion []   Labs   CBC: Recent Labs  Lab 05/23/23 1728 05/24/23 0151 05/24/23 0605 05/25/23 0236 05/26/23 1558  WBC 9.9  --  14.4* 19.8* 17.1*  NEUTROABS 6.2  --   --   --   --   HGB 11.3* 11.6* 9.4* 8.9* 10.3*  HCT 36.6 34.0* 30.7* 28.9* 33.4*  MCV 92.7  --  93.9 93.5 92.8  PLT 296  --  258 232 259    Basic Metabolic Panel: Recent Labs  Lab 05/24/23 0605 05/24/23 0942 05/25/23 0236 05/26/23 1558 05/27/23 0300  NA 135 134* 139 134* 137  K 4.1 4.3 4.9 5.6* 4.9  CL 101 103 107 102 103  CO2 16* 16* 20* 18* 24  GLUCOSE 615* 539* 205* 369* 178*  BUN 31* 30* 29* 49* 48*  CREATININE 2.70* 2.67* 2.18* 2.40* 2.23*  CALCIUM  8.9 9.3 9.5 9.9 10.1  MG 2.1  --   --   --   --    GFR: Estimated Creatinine Clearance: 17.6 mL/min (A) (by C-G formula based on SCr of 2.23 mg/dL (H)). Recent Labs  Lab 05/23/23 1728 05/24/23 0435 05/24/23 0605 05/24/23 0942 05/24/23 1251 05/24/23 1412 05/25/23 0236 05/26/23 1558  PROCALCITON  --   --  0.10  --   --   --  0.21  --   WBC 9.9  --  14.4*  --   --   --  19.8* 17.1*  LATICACIDVEN  --  7.8*  --  5.4* 4.0* 3.1*  --   --        Jennifer Moellers. [place]  05/28/23 9:28 AM [facility]  For contact information, see Amion. If no response to pager, please call PCCM consult pager. After hours, 7PM- 7AM, please call Elink.

## 2023-05-28 NOTE — Progress Notes (Signed)
 Physical Therapy Treatment Patient Details Name: [name] MRN: [id number] DOB: 1950-10-01 Today's Date: 05/28/2023   History of Present Illness Pt is 73 year old presented to [facility] from urgent care on  05/23/23 for SOB. Pt with copd exacerbation and septic shock due to presumed PNA. Pt required bipap.  Pt with costocondral pain. PMH - breast CA, copd, cad, DM, HTN, ckd, lt shoulder replacement, anxiety, gout.    PT Comments  Pt received in chair, agreeable to therapy session, pt with hoarse voice and notably high pitched wheezing sound with breathing, but SpO2 90% and above on RA with seated BLE exercises and repositioning. Pt needing multimodal cues for improved ROM with activity but denies severe pain. Had been preparing to work on standing activity when lab arrived, but lab techs needed increased time to take samples, so defer gait until later, pt up in chair at end of session. Pt continues to benefit from PT services to progress toward functional mobility goals.     If plan is discharge home, recommend the following: A lot of help with walking and/or transfers;A lot of help with bathing/dressing/bathroom;Assist for transportation;Help with stairs or ramp for entrance   Can travel by private vehicle        Equipment Recommendations  Rollator (4 wheels) (pt reports her rollator is >50 years old and is not safe to use)    Recommendations for Other Services       Precautions / Restrictions Precautions Precautions: Fall;Other (comment) Precaution/Restrictions Comments: watch SpO2 Restrictions Weight Bearing Restrictions Per Provider Order: No     Mobility  Bed Mobility Overal bed mobility: Needs Assistance             General bed mobility comments: OOB in chair upon arrival and departure    Transfers Overall transfer level: Needs assistance Equipment used: Rolling walker (2 wheels) Transfers: Sit to/from Stand, Bed to chair/wheelchair/BSC             General  transfer comment: Genene Kennel RW in her ICU room adjusted lower for proper height, not able to assess standing yet as lab arriving and needing increased time to take blood.    Ambulation/Gait                   Stairs             Wheelchair Mobility     Tilt Bed    Modified Rankin (Stroke Patients Only)       Balance Overall balance assessment: Needs assistance Sitting-balance support: No upper extremity supported, Feet supported Sitting balance-Leahy Scale: Fair Sitting balance - Comments: sitting forward in chair, tends to use hands but able to sit w/o using hands   Standing balance support: Bilateral upper extremity supported Standing balance-Leahy Scale: Poor Standing balance comment: reliant on external support                            Communication Communication Communication: Impaired Factors Affecting Communication: Hearing impaired;Reduced clarity of speech (mouthes words, hoarse voice)  Cognition Arousal: Alert Behavior During Therapy: WFL for tasks assessed/performed   PT - Cognitive impairments: No family/caregiver present to determine baseline                       PT - Cognition Comments: Slow processing Following commands: Intact      Cueing Cueing Techniques: Verbal cues, Tactile cues  Exercises Other Exercises Other Exercises: seated BLE  AROM: LAQ, hip flexion ankle pumps/circles x10-15 reps ea Other Exercises: chair "crunches" x3 reps using BUE to pull forward to long sitting posture Other Exercises: further exercise deferred due to arrival of lab techs needing increased time    General Comments General comments (skin integrity, edema, etc.): BP 108/87 HR 65-78 bpm seated; SpO2 90-94% on RA in chair      Pertinent Vitals/Pain Pain Assessment Pain Assessment: Faces Faces Pain Scale: Hurts a little bit Pain Location: back and throat/ribs Pain Descriptors / Indicators: Grimacing, Guarding, Sore Pain  Intervention(s): Limited activity within patient's tolerance, Monitored during session, Premedicated before session, Repositioned, Other (comment) (RN placed lidocaine  patch over her L anterior chest/breast region at beginning of session)    Home Living                          Prior Function            PT Goals (current goals can now be found in the care plan section) Acute Rehab PT Goals Patient Stated Goal: To go home PT Goal Formulation: With patient Time For Goal Achievement: 06/09/23 Progress towards PT goals: Progressing toward goals    Frequency    Min 2X/week      PT Plan      Co-evaluation              AM-PAC PT "6 Clicks" Mobility   Outcome Measure  Help needed turning from your back to your side while in a flat bed without using bedrails?: A Lot Help needed moving from lying on your back to sitting on the side of a flat bed without using bedrails?: A Lot Help needed moving to and from a bed to a chair (including a wheelchair)?: A Little Help needed standing up from a chair using your arms (e.g., wheelchair or bedside chair)?: A Little Help needed to walk in hospital room?: Total Help needed climbing 3-5 steps with a railing? : Total 6 Click Score: 12    End of Session   Activity Tolerance: Patient tolerated treatment well;Other (comment) (arrival of lab techs needing time to take multiple samples) Patient left: in chair;with call bell/phone within reach;Other (comment) (lab techs in room) Nurse Communication: Mobility status PT Visit Diagnosis: Unsteadiness on feet (R26.81);Other abnormalities of gait and mobility (R26.89);Muscle weakness (generalized) (M62.81);Pain Pain - part of body:  (L lower back and L anterior ribs (RN aware))     Time: 1610-9604 PT Time Calculation (min) (ACUTE ONLY): 10 min  Charges:    $Therapeutic Exercise: 8-22 mins PT General Charges $$ ACUTE PT VISIT: 1 Visit                     Christia Coaxum P., PTA Acute  Rehabilitation Services Secure Chat Preferred 9a-5:30pm Office: [phone]    Mariel Shope [facility] 05/28/2023, 11:27 AM

## 2023-05-28 NOTE — Plan of Care (Signed)
 Progressing in all aspects of care

## 2023-05-28 NOTE — Progress Notes (Signed)
 TR[facility] night cross cover note:   I was notified by RN that the patient has been hyperglycemic throughout the day, with increasing CBG readings, now with most recent CBG 415.  She is on basal insulin  in the form of Semglee  20 units SQ twice daily, and is also set to receive a scheduled 10 units of NovoLog  at this time in addition to sliding scale NovoLog .   For my brief review, it appears that her CBG has been elevated throughout the day, including a fasting a.m. blood sugar this morning of nearly 200 after receiving her evening Semglee  20 units on the evening of 05/27/2023.  Given the persistent elevation of blood sugars, including those associated with fasting a.m. values, will increase basal insulin  at this time from Semglee  20 units subcu twice daily to Semglee  25 units SQ twice daily, with first dose now.  Additionally, I requested a repeat CBG to be checked approximately 1 hour after the patient has received her scheduled 10 units of NovoLog  as well as sliding scale NovoLog , and asked to be notified if this ensuing CBG result remains greater than 400.    Camelia Cavalier, DO Hospitalist

## 2023-05-28 NOTE — Progress Notes (Signed)
 SATURATION QUALIFICATIONS: (This note is used to comply with regulatory documentation for home oxygen )  Patient Saturations on Room Air at Rest = 92%  Patient Saturations on Room Air while Ambulating = 82%  Patient Saturations on 2 Liters of oxygen  while Ambulating = 90%  Please briefly explain why patient needs home oxygen : Pt hypoxic on RA with exertional tasks (standing and marching in place).

## 2023-05-29 DIAGNOSIS — J9601 Acute respiratory failure with hypoxia: Secondary | ICD-10-CM | POA: Diagnosis not present

## 2023-05-29 DIAGNOSIS — J441 Chronic obstructive pulmonary disease with (acute) exacerbation: Secondary | ICD-10-CM | POA: Diagnosis not present

## 2023-05-29 DIAGNOSIS — E1165 Type 2 diabetes mellitus with hyperglycemia: Secondary | ICD-10-CM | POA: Diagnosis not present

## 2023-05-29 LAB — CULTURE, BLOOD (ROUTINE X 2)
Culture: NO GROWTH
Culture: NO GROWTH
Special Requests: ADEQUATE
Special Requests: ADEQUATE

## 2023-05-29 LAB — GLUCOSE, CAPILLARY
Glucose-Capillary: 106 mg/dL — ABNORMAL HIGH (ref 70–99)
Glucose-Capillary: 139 mg/dL — ABNORMAL HIGH (ref 70–99)
Glucose-Capillary: 198 mg/dL — ABNORMAL HIGH (ref 70–99)
Glucose-Capillary: 244 mg/dL — ABNORMAL HIGH (ref 70–99)
Glucose-Capillary: 374 mg/dL — ABNORMAL HIGH (ref 70–99)
Glucose-Capillary: 83 mg/dL (ref 70–99)

## 2023-05-29 MED ORDER — INSULIN ASPART 100 UNIT/ML IJ SOLN
0.0000 [IU] | Freq: Three times a day (TID) | INTRAMUSCULAR | Status: DC
  Administered 2023-05-29: 4 [IU] via SUBCUTANEOUS

## 2023-05-29 MED ORDER — INSULIN GLARGINE-YFGN 100 UNIT/ML ~~LOC~~ SOLN
20.0000 [IU] | Freq: Every day | SUBCUTANEOUS | Status: DC
  Administered 2023-05-30 – 2023-06-02 (×4): 20 [IU] via SUBCUTANEOUS
  Filled 2023-05-29 (×4): qty 0.2

## 2023-05-29 MED ORDER — AMLODIPINE BESYLATE 5 MG PO TABS: 5.0000 mg | ORAL_TABLET | Freq: Every day | ORAL | Status: DC

## 2023-05-29 MED ORDER — INSULIN ASPART 100 UNIT/ML IJ SOLN
7.0000 [IU] | Freq: Three times a day (TID) | INTRAMUSCULAR | Status: DC
  Administered 2023-05-29: 7 [IU] via SUBCUTANEOUS

## 2023-05-29 MED ORDER — HYDROMORPHONE HCL 1 MG/ML IJ SOLN
0.5000 mg | INTRAMUSCULAR | Status: DC | PRN
  Administered 2023-05-30 – 2023-05-31 (×3): 0.5 mg via INTRAVENOUS
  Filled 2023-05-29 (×3): qty 0.5

## 2023-05-29 MED ORDER — INSULIN ASPART 100 UNIT/ML IJ SOLN
0.0000 [IU] | Freq: Every day | INTRAMUSCULAR | Status: DC
Start: 2023-05-29 — End: 2023-05-30

## 2023-05-29 MED ORDER — OXYCODONE HCL 5 MG PO TABS
5.0000 mg | ORAL_TABLET | Freq: Four times a day (QID) | ORAL | Status: DC | PRN
  Administered 2023-05-29 – 2023-06-02 (×10): 5 mg via ORAL
  Filled 2023-05-29 (×10): qty 1

## 2023-05-29 MED ORDER — PREDNISONE 20 MG PO TABS
40.0000 mg | ORAL_TABLET | Freq: Every day | ORAL | Status: DC
  Administered 2023-05-31 (×2): 40 mg via ORAL
  Filled 2023-05-29 (×2): qty 2

## 2023-05-29 MED ORDER — INSULIN GLARGINE-YFGN 100 UNIT/ML ~~LOC~~ SOLN
20.0000 [IU] | Freq: Two times a day (BID) | SUBCUTANEOUS | Status: DC
  Filled 2023-05-29 (×2): qty 0.2

## 2023-05-29 NOTE — Plan of Care (Signed)
  Problem: Education: Goal: Ability to describe self-care measures that may prevent or decrease complications (Diabetes Survival Skills Education) will improve Outcome: Progressing Goal: Individualized Educational Video(s) Outcome: Progressing   Problem: Coping: Goal: Ability to adjust to condition or change in health will improve Outcome: Progressing   Problem: Fluid Volume: Goal: Ability to maintain a balanced intake and output will improve Outcome: Progressing   Problem: Health Behavior/Discharge Planning: Goal: Ability to identify and utilize available resources and services will improve Outcome: Progressing Goal: Ability to manage health-related needs will improve Outcome: Progressing   Problem: Metabolic: Goal: Ability to maintain appropriate glucose levels will improve Outcome: Progressing   Problem: Nutritional: Goal: Maintenance of adequate nutrition will improve Outcome: Progressing Goal: Progress toward achieving an optimal weight will improve Outcome: Progressing   Problem: Skin Integrity: Goal: Risk for impaired skin integrity will decrease Outcome: Progressing   Problem: Tissue Perfusion: Goal: Adequacy of tissue perfusion will improve Outcome: Progressing   Problem: Fluid Volume: Goal: Hemodynamic stability will improve Outcome: Progressing   Problem: Clinical Measurements: Goal: Diagnostic test results will improve Outcome: Progressing Goal: Signs and symptoms of infection will decrease Outcome: Progressing   Problem: Respiratory: Goal: Ability to maintain adequate ventilation will improve Outcome: Progressing   Problem: Activity: Goal: Ability to tolerate increased activity will improve Outcome: Progressing   Problem: Clinical Measurements: Goal: Ability to maintain a body temperature in the normal range will improve Outcome: Progressing   Problem: Respiratory: Goal: Ability to maintain adequate ventilation will improve Outcome:  Progressing Goal: Ability to maintain a clear airway will improve Outcome: Progressing   Problem: Education: Goal: Knowledge of General Education information will improve Description: Including pain rating scale, medication(s)/side effects and non-pharmacologic comfort measures Outcome: Progressing   Problem: Health Behavior/Discharge Planning: Goal: Ability to manage health-related needs will improve Outcome: Progressing   Problem: Clinical Measurements: Goal: Ability to maintain clinical measurements within normal limits will improve Outcome: Progressing Goal: Will remain free from infection Outcome: Progressing Goal: Diagnostic test results will improve Outcome: Progressing Goal: Respiratory complications will improve Outcome: Progressing Goal: Cardiovascular complication will be avoided Outcome: Progressing   Problem: Activity: Goal: Risk for activity intolerance will decrease Outcome: Progressing   Problem: Nutrition: Goal: Adequate nutrition will be maintained Outcome: Progressing   Problem: Coping: Goal: Level of anxiety will decrease Outcome: Progressing   Problem: Elimination: Goal: Will not experience complications related to bowel motility Outcome: Progressing Goal: Will not experience complications related to urinary retention Outcome: Progressing   Problem: Pain Managment: Goal: General experience of comfort will improve and/or be controlled Outcome: Progressing   Problem: Safety: Goal: Ability to remain free from injury will improve Outcome: Progressing   Problem: Skin Integrity: Goal: Risk for impaired skin integrity will decrease Outcome: Progressing

## 2023-05-29 NOTE — Progress Notes (Signed)
 PROGRESS NOTE   [name]  NWG:[id number]    DOB: 1950/03/29    DOA: 05/23/2023  PCP: [facility]   I have briefly reviewed patients previous medical records in [facility].   Brief Hospital Course:   73 yo female with PMH of COPD, T2DM, HTN, anxiety, gout, HLD, GERD who presented from urgent care to ED on 05/23/2023 with complaints of shortness of breath and productive cough for 4 days after she was noted to be hypoxic at urgent care. She was negative for flu/covid at clinic.  She was noted to have oxygen  saturation of 91% and she was in respiratory distress.  She was placed on BiPAP but after some time she removed the BiPAP and refused placement.  PCCM was consulted and despite saturating at 100% on 2 L/min [place] oxygen , she had accessory muscle use and was tachypneic, rather somnolent but arousable albeit confused.  Family declined intubation and she was continued on BiPAP. 4/19, able to come off of BiPAP.              Patient transferred to progressive bed and TRH assumed care on 4/22   Assessment & Plan:   Acute respiratory failure with hypoxia Suspected due to COPD exacerbation and presumed pneumonia Was initially on BiPAP and was able to come off of it on 4/19.  Family declined intubation Hypoxia has resolved and currently saturating in the high 90s on room air Continue supportive care  Community-acquired pneumonia Completed azithromycin .  Complete course of IV ceftriaxone  Will need follow-up chest x-ray in 4 weeks  COPD exacerbation Clinically improved. Transition from IV Solu-Medrol  60 mg every 12 hours to oral prednisone  taper from tomorrow starting with prednisone  40 mg daily. Continue Brovana , Pulmicort , DuoNebs as needed and Yupelri . Incentive spirometry and flutter valve.   Septic shock, resolved - Presumed secondary to pneumonia - Was on Levophed  drip on 4/18 - 4/19.   Type 2 diabetes - Recent A1c of 8.2% on 4/18. - CBGs significantly  elevated to 200s to 300s in the setting of steroid use - Semglee  increased to 25 units twice daily last night - NovoLog  increased to 10 units 3 times daily with meals - SSI with meals, CBG monitoring -Now that steroids are being decreased, we will have to monitor closely and cut back on insulins as needed.   AKI on CKD 4 Last known creatinine in February 2025 in September 2024 were in the 2.07-2.03 range Presented with creatinine of 2.39 which peaked to 2.7, likely related to shock and acute illness For the last 3 days, creatinine fluctuating in the 2.2-2.4 range.  Continue to monitor.  This may be a new baseline. Avoid nephrotoxics or hypotension.   Essential HTN Soft blood pressures with systolics in the 90s-100s Reduce amlodipine  to 5 Mg daily.  Not on metoprolol .   # Gout - Continue allopurinol    # HLD - Continue atorvastatin    # Anxiety - Continue on duloxetine  and Atarax    # GERD - Continue Protonix    # Generalized weakness - In the setting of acute illness - PT/OT eval and treat.  They recommend home health PT and OT    Body mass index is 28.05 kg/m.   DVT prophylaxis: heparin  injection 5,000 Units Start: 05/24/23 0600     Code Status: Full Code:  Family Communication: None at bedside Disposition:  Status is: Inpatient Remains inpatient appropriate because: Patient was critically ill and this gradually improving, remains on IV steroids with plans  to transition to oral meds, monitor closely for continued improvement over the next 1 to 2 days, glycemic monitoring while steroids are being cut back and discharged home possibly in the next 24 to 48 hours.     Consultants:   PCCM  Procedures:   BiPAP  Subjective:  States that she has intermittent cough with green sputum.  Anterior chest wall pain on coughing.  Some intermittent dyspnea but no wheezing.  Objective:   Vitals:   05/28/23 2329 05/28/23 2353 05/29/23 0417 05/29/23 0818  BP:  107/61 104/78 99/67   Pulse:  83 76 75  Resp:  16 18 17   Temp: 98.7 F (37.1 C) 98.2 F (36.8 C) 97.6 F (36.4 C)   TempSrc: Oral Oral Oral   SpO2:  93% 94% 99%  Weight:      Height:        General exam: Elderly female, moderately built and frail lying comfortably crouched up in bed without distress. Respiratory system: Minimally harsh breath sounds but no overt wheezing, rhonchi or crackles.  No increased work of breathing. Cardiovascular system: S1 & S2 heard, RRR. No JVD, murmurs, rubs, gallops or clicks. No pedal edema.  Telemetry personally reviewed: Sinus rhythm. Gastrointestinal system: Abdomen is nondistended, soft and nontender. No organomegaly or masses felt. Normal bowel sounds heard. Central nervous system: Alert and oriented. No focal neurological deficits. Extremities: Symmetric 5 x 5 power. Skin: No rashes, lesions or ulcers Psychiatry: Judgement and insight appear normal. Mood & affect appropriate.     Data Reviewed:   I have personally reviewed following labs and imaging studies   CBC: Recent Labs  Lab 05/23/23 1728 05/24/23 0151 05/25/23 0236 05/26/23 1558 05/28/23 1044  WBC 9.9   < > 19.8* 17.1* 14.1*  NEUTROABS 6.2  --   --   --   --   HGB 11.3*   < > 8.9* 10.3* 11.5*  HCT 36.6   < > 28.9* 33.4* 36.7  MCV 92.7   < > 93.5 92.8 90.2  PLT 296   < > 232 259 285   < > = values in this interval not displayed.    Basic Metabolic Panel: Recent Labs  Lab 05/24/23 0605 05/24/23 0942 05/25/23 0236 05/26/23 1558 05/27/23 0300 05/28/23 1044  NA 135 134* 139 134* 137 135  K 4.1 4.3 4.9 5.6* 4.9 5.0  CL 101 103 107 102 103 99  CO2 16* 16* 20* 18* 24 23  GLUCOSE 615* 539* 205* 369* 178* 218*  BUN 31* 30* 29* 49* 48* 56*  CREATININE 2.70* 2.67* 2.18* 2.40* 2.23* 2.43*  CALCIUM  8.9 9.3 9.5 9.9 10.1 9.8  MG 2.1  --   --   --   --   --     Liver Function Tests: Recent Labs  Lab 05/24/23 0605 05/25/23 0236 05/26/23 1558  AST 21 14* 20  ALT 10 10 12   ALKPHOS 118 86  94  BILITOT 0.5 0.4 0.5  PROT 7.6 7.1 7.5  ALBUMIN  3.2* 3.3* 3.4*    CBG: Recent Labs  Lab 05/29/23 0418 05/29/23 0817 05/29/23 1218  GLUCAP 139* 106* 374*    Microbiology Studies:   Recent Results (from the past 240 hours)  Respiratory (~20 pathogens) panel by PCR     Status: None   Collection Time: 05/24/23  4:27 AM   Specimen: Nasopharyngeal Swab; Respiratory  Result Value Ref Range Status   Adenovirus NOT DETECTED NOT DETECTED Final   Coronavirus 229E NOT DETECTED NOT  DETECTED Final    Comment: (NOTE) The Coronavirus on the Respiratory Panel, DOES NOT test for the novel  Coronavirus (2019 nCoV)    Coronavirus HKU1 NOT DETECTED NOT DETECTED Final   Coronavirus NL63 NOT DETECTED NOT DETECTED Final   Coronavirus OC43 NOT DETECTED NOT DETECTED Final   Metapneumovirus NOT DETECTED NOT DETECTED Final   Rhinovirus / Enterovirus NOT DETECTED NOT DETECTED Final   Influenza A NOT DETECTED NOT DETECTED Final   Influenza B NOT DETECTED NOT DETECTED Final   Parainfluenza Virus 1 NOT DETECTED NOT DETECTED Final   Parainfluenza Virus 2 NOT DETECTED NOT DETECTED Final   Parainfluenza Virus 3 NOT DETECTED NOT DETECTED Final   Parainfluenza Virus 4 NOT DETECTED NOT DETECTED Final   Respiratory Syncytial Virus NOT DETECTED NOT DETECTED Final   Bordetella pertussis NOT DETECTED NOT DETECTED Final   Bordetella Parapertussis NOT DETECTED NOT DETECTED Final   Chlamydophila pneumoniae NOT DETECTED NOT DETECTED Final   Mycoplasma pneumoniae NOT DETECTED NOT DETECTED Final    Comment: Performed at [facility], [address], [place], [place] [address]  SARS Coronavirus 2 by RT PCR (hospital order, performed in [facility] hospital lab) *cepheid single result test* Anterior Nasal Swab     Status: None   Collection Time: 05/24/23  4:27 AM   Specimen: Anterior Nasal Swab  Result Value Ref Range Status   SARS Coronavirus 2 by RT PCR NEGATIVE NEGATIVE Final    Comment: Performed at  [facility], [address], [place], [place] [address]  Culture, blood (Routine X 2) w Reflex to ID Panel     Status: None   Collection Time: 05/24/23  6:05 AM   Specimen: BLOOD  Result Value Ref Range Status   Specimen Description BLOOD BLOOD LEFT HAND  Final   Special Requests   Final    BOTTLES DRAWN AEROBIC AND ANAEROBIC Blood Culture adequate volume   Culture   Final    NO GROWTH 5 DAYS Performed at [facility], [address], [place], [place] [address]    Report Status 05/29/2023 FINAL  Final  Culture, blood (Routine X 2) w Reflex to ID Panel     Status: None   Collection Time: 05/24/23  6:05 AM   Specimen: BLOOD  Result Value Ref Range Status   Specimen Description BLOOD BLOOD LEFT HAND  Final   Special Requests   Final    BOTTLES DRAWN AEROBIC AND ANAEROBIC Blood Culture adequate volume   Culture   Final    NO GROWTH 5 DAYS Performed at [facility], [address], [place], [place] [address]    Report Status 05/29/2023 FINAL  Final  MRSA Next Gen by PCR, Nasal     Status: None   Collection Time: 05/24/23 12:25 PM   Specimen: Nasal Mucosa; Nasal Swab  Result Value Ref Range Status   MRSA by PCR Next Gen NOT DETECTED NOT DETECTED Final    Comment: (NOTE) The GeneXpert MRSA Assay (FDA approved for NASAL specimens only), is one component of a comprehensive MRSA colonization surveillance program. It is not intended to diagnose MRSA infection nor to guide or monitor treatment for MRSA infections. Test performance is not FDA approved in patients less than 49 years old. Performed at [facility], [address], [place], [place] [address]     Radiology Studies:  No results found.  Scheduled Meds:    allopurinol   100 mg Oral Daily  amLODipine   10 mg Oral Daily   anastrozole   1 mg Oral Daily   arformoterol   15 mcg Nebulization BID   aspirin  EC  81 mg Oral Daily   atorvastatin   20 mg Oral Daily   budesonide  (PULMICORT ) nebulizer solution   0.5 mg Nebulization BID   DULoxetine   30 mg Oral Daily   feeding supplement  237 mL Oral BID BM   ferrous sulfate   325 mg Oral BID WC   gabapentin   100 mg Oral BID   And   gabapentin   200 mg Oral QHS   heparin   5,000 Units Subcutaneous Q8H   hydrOXYzine   50 mg Oral QHS   insulin  aspart  0-20 Units Subcutaneous Q4H   insulin  aspart  10 Units Subcutaneous TID WC   insulin  glargine-yfgn  25 Units Subcutaneous BID   lidocaine   1 patch Transdermal Q24H   methylPREDNISolone  (SOLU-MEDROL ) injection  60 mg Intravenous Q12H   pantoprazole   40 mg Oral BID   polyethylene glycol  17 g Oral Daily   revefenacin   175 mcg Nebulization Daily   sodium chloride  flush  3 mL Intravenous Q12H    Continuous Infusions:    cefTRIAXone  (ROCEPHIN )  IV Stopped (05/28/23 2039)     LOS: 5 days     Aubrey Blas, MD,  FACP, [facility], [facility], [facility]   [facility] [place]      To contact the attending provider between 7A-7P or the covering provider during after hours 7P-7A, please log into the web site [email or web address] and access using universal [place] password for that web site. If you do not have the password, please call the hospital operator.  05/29/2023, 2:26 PM

## 2023-05-29 NOTE — Progress Notes (Signed)
 Patient resting comfortably without BIPAP at this time. Per ICU 11M RRT patient was put on servo BIPAP earlier in the shift but would not keep machine on. Will reevaluate 4/23 PM shift.

## 2023-05-29 NOTE — Care Management Important Message (Signed)
 Important Message  Patient Details  Name: [name] MRN: [id number] Date of Birth: 10/07/1950   Important Message Given:  Yes - Medicare IM     Wynonia Hedges 05/29/2023, 4:00 PM

## 2023-05-29 NOTE — Progress Notes (Signed)
 Mobility Specialist: Progress Note   05/29/23 1533  Mobility  Activity Transferred from chair to bed  Level of Assistance Contact guard assist, steadying assist  Assistive Device Four point cane  Activity Response Tolerated fair  Mobility Referral Yes  Mobility visit 1 Mobility  Mobility Specialist Start Time (ACUTE ONLY) 1213  Mobility Specialist Stop Time (ACUTE ONLY) 1217  Mobility Specialist Time Calculation (min) (ACUTE ONLY) 4 min    MS responded to a call bell request for pt wanting to return back to bed - received in chair. MinG with 4PC for stand pivot transfer to bed. Feeling very fatigued once in bed. Left in supine with all needs met, call bell in reach. NT in room.   Deloria Fetch Mobility Specialist Please contact via SecureChat or Rehab office at [phone]

## 2023-05-29 NOTE — Progress Notes (Signed)
 Transition of Care [facility]) - Inpatient Brief Assessment   Patient Details  Name: [name] MRN: [id number] Date of Birth: 1950/09/22  Transition of Care [facility]) CM/SW Contact:    Dane Dung, RN Phone Number: 05/29/2023, 4:14 PM   Clinical Narrative: CM met with the patient at the bedside to discuss TOC needs.  The patient admitted to the hospital with SOB and respiratory failure and now remains on RA on 2 West unit.  Patient states that she lives with her son/grandson at the home, who provide 24 hour care and supervision at the home.  DME at the home includes broken rolator (>years old), nebulizer machine, Cane, BSC, and RW.  The patient was provided with Medicare choice regarding home health services and DME company and patient did not have a preference.  I called [facility] and [facility] accepted for [facility] PT/OT.  HH orders placed.  I called [facility] and requested delivery of rolator to the hospital room.  No other TOC needs at this time.   Transition of Care Asessment: Insurance and Status: (P) Insurance coverage has been reviewed Patient has primary care physician: (P) Yes Home environment has been reviewed: (P) from home with son/grandson Prior level of function:: (P) RW Prior/Current Home Services: (P) No current home services Social Drivers of Health Review: (P) SDOH reviewed needs interventions Readmission risk has been reviewed: (P) Yes Transition of care needs: (P) transition of care needs identified, TOC will continue to follow

## 2023-05-29 NOTE — Progress Notes (Signed)
    Durable Medical Equipment  (From admission, onward)           Start     Ordered   05/29/23 1612  For home use only DME 4 wheeled rolling walker with seat  Once       Question Answer Comment  Patient needs a walker to treat with the following condition COPD (chronic obstructive pulmonary disease) (HCC)   Patient needs a walker to treat with the following condition Physical deconditioning      05/29/23 1612

## 2023-05-29 NOTE — Progress Notes (Signed)
 Occupational Therapy Treatment Patient Details Name: [name] MRN: [id number] DOB: 1950-08-22 Today's Date: 05/29/2023   History of present illness Pt is 73 year old presented to [facility] from urgent care on  05/23/23 for SOB. Pt with copd exacerbation and septic shock due to presumed PNA. Pt required bipap.  Pt with costocondral pain. PMH - breast CA, copd, cad, DM, HTN, ckd, lt shoulder replacement, anxiety, gout.   OT comments  Pt progressing toward goals, able to ambulate across room to [facility] for toileting and to sink for standing grooming task. Pt needing min A for bed mobility and transfers with quad cane. Pt able to stand and perform posterior pericare with min A. Pt presenting with impairments listed below, will follow acutely. Updating d/c recommendation, recommend HHOT at d/c given pt has family assist at home.       If plan is discharge home, recommend the following:  Assistance with cooking/housework;A lot of help with bathing/dressing/bathroom;Assist for transportation;Direct supervision/assist for medications management;Direct supervision/assist for financial management;A little help with walking and/or transfers   Equipment Recommendations  Tub/shower seat;Other (comment) (RW)    Recommendations for Other Services PT consult    Precautions / Restrictions Precautions Precautions: Fall;Other (comment) Precaution/Restrictions Comments: watch SpO2 Restrictions Weight Bearing Restrictions Per Provider Order: No       Mobility Bed Mobility Overal bed mobility: Needs Assistance Bed Mobility: Supine to Sit     Supine to sit: Min assist          Transfers Overall transfer level: Needs assistance Equipment used: Quad cane Transfers: Sit to/from Stand, Bed to chair/wheelchair/BSC Sit to Stand: Min assist           General transfer comment: ambulates to [facility] across the room and to sink for standing grooming task     Balance Overall balance assessment: Needs  assistance Sitting-balance support: No upper extremity supported, Feet supported Sitting balance-Leahy Scale: Fair     Standing balance support: Bilateral upper extremity supported Standing balance-Leahy Scale: Fair Standing balance comment: static stands with unilateral UE AD                           ADL either performed or assessed with clinical judgement   ADL Overall ADL's : Needs assistance/impaired Eating/Feeding: Set up;Sitting   Grooming: Wash/dry hands;Standing;Minimal assistance Grooming Details (indicate cue type and reason): min A to reach soap and paper towel                 Toilet Transfer: Minimal assistance;Ambulation Toilet Transfer Details (indicate cue type and reason): quad cane Toileting- Clothing Manipulation and Hygiene: Minimal assistance;Sit to/from stand Toileting - Clothing Manipulation Details (indicate cue type and reason): min A for steadying for pt to perform standing pericare     Functional mobility during ADLs: Minimal assistance;Cane      Extremity/Trunk Assessment Upper Extremity Assessment Upper Extremity Assessment: Generalized weakness   Lower Extremity Assessment Lower Extremity Assessment: Defer to PT evaluation        Vision   Vision Assessment?: No apparent visual deficits   Perception Perception Perception: Not tested   Praxis Praxis Praxis: Not tested   Communication Communication Communication: Impaired Factors Affecting Communication: Hearing impaired (mouthes words, hoarse voice)   Cognition Arousal: Alert Behavior During Therapy: WFL for tasks assessed/performed Cognition: No apparent impairments  Following commands: Intact        Cueing   Cueing Techniques: Verbal cues, Tactile cues  Exercises      Shoulder Instructions       General Comments VSS, pt coughing and clearing throat during session    Pertinent Vitals/ Pain       Pain  Assessment Pain Assessment: Faces Pain Score: 2  Faces Pain Scale: Hurts a little bit Pain Location: chest Pain Descriptors / Indicators: Discomfort Pain Intervention(s): Monitored during session  Home Living                                          Prior Functioning/Environment              Frequency  Min 2X/week        Progress Toward Goals  OT Goals(current goals can now be found in the care plan section)  Progress towards OT goals: Progressing toward goals  Acute Rehab OT Goals Patient Stated Goal: none stated OT Goal Formulation: With patient Time For Goal Achievement: 06/10/23 Potential to Achieve Goals: Good ADL Goals Pt Will Perform Upper Body Dressing: with supervision;sitting Pt Will Perform Lower Body Dressing: with supervision;sit to/from stand;sitting/lateral leans Pt Will Transfer to Toilet: with supervision;ambulating Additional ADL Goal #1: pt will perform bed mobility min A in prep for ADLs  Plan      Co-evaluation                 AM-PAC OT "6 Clicks" Daily Activity     Outcome Measure   Help from another person eating meals?: A Little Help from another person taking care of personal grooming?: A Little Help from another person toileting, which includes using toliet, bedpan, or urinal?: A Lot Help from another person bathing (including washing, rinsing, drying)?: A Lot Help from another person to put on and taking off regular upper body clothing?: A Little Help from another person to put on and taking off regular lower body clothing?: A Lot 6 Click Score: 15    End of Session Equipment Utilized During Treatment: Gait belt;Other (comment) (cance)  OT Visit Diagnosis: Unsteadiness on feet (R26.81);Other abnormalities of gait and mobility (R26.89);Muscle weakness (generalized) (M62.81)   Activity Tolerance Patient tolerated treatment well   Patient Left in chair;with call bell/phone within reach   Nurse  Communication Mobility status        Time: 1610-9604 OT Time Calculation (min): 20 min  Charges: OT General Charges $OT Visit: 1 Visit OT Treatments $Self Care/Home Management : 8-22 mins  Jadarious Dobbins K, OTD, OTR/L SecureChat Preferred Acute Rehab [phone]   Benedict Brain Koonce 05/29/2023, 9:14 AM

## 2023-05-30 DIAGNOSIS — J441 Chronic obstructive pulmonary disease with (acute) exacerbation: Secondary | ICD-10-CM | POA: Diagnosis not present

## 2023-05-30 DIAGNOSIS — R0789 Other chest pain: Secondary | ICD-10-CM

## 2023-05-30 DIAGNOSIS — N189 Chronic kidney disease, unspecified: Secondary | ICD-10-CM

## 2023-05-30 DIAGNOSIS — N179 Acute kidney failure, unspecified: Secondary | ICD-10-CM | POA: Diagnosis not present

## 2023-05-30 LAB — BASIC METABOLIC PANEL WITH GFR
Anion gap: 11 (ref 5–15)
BUN: 74 mg/dL — ABNORMAL HIGH (ref 8–23)
CO2: 23 mmol/L (ref 22–32)
Calcium: 8.7 mg/dL — ABNORMAL LOW (ref 8.9–10.3)
Chloride: 100 mmol/L (ref 98–111)
Creatinine, Ser: 3.04 mg/dL — ABNORMAL HIGH (ref 0.44–1.00)
GFR, Estimated: 15 mL/min — ABNORMAL LOW (ref >60)
Glucose, Bld: 276 mg/dL — ABNORMAL HIGH (ref 70–99)
Potassium: 5 mmol/L (ref 3.5–5.1)
Sodium: 134 mmol/L — ABNORMAL LOW (ref 135–145)

## 2023-05-30 LAB — GLUCOSE, CAPILLARY
Glucose-Capillary: 258 mg/dL — ABNORMAL HIGH (ref 70–99)
Glucose-Capillary: 184 mg/dL — ABNORMAL HIGH (ref 70–99)
Glucose-Capillary: 160 mg/dL — ABNORMAL HIGH (ref 70–99)
Glucose-Capillary: 186 mg/dL — ABNORMAL HIGH (ref 70–99)

## 2023-05-30 LAB — CBC
HCT: 31.7 % — ABNORMAL LOW (ref 36.0–46.0)
Hemoglobin: 9.9 g/dL — ABNORMAL LOW (ref 12.0–15.0)
MCH: 28.5 pg (ref 26.0–34.0)
MCHC: 31.2 g/dL (ref 30.0–36.0)
MCV: 91.4 fL (ref 80.0–100.0)
Platelets: 259 10*3/uL (ref 150–400)
RBC: 3.47 MIL/uL — ABNORMAL LOW (ref 3.87–5.11)
RDW: 14.8 % (ref 11.5–15.5)
WBC: 18.2 10*3/uL — ABNORMAL HIGH (ref 4.0–10.5)
nRBC: 1 % — ABNORMAL HIGH (ref 0.0–0.2)

## 2023-05-30 MED ORDER — BENZONATATE 100 MG PO CAPS
200.0000 mg | ORAL_CAPSULE | Freq: Three times a day (TID) | ORAL | Status: DC
  Administered 2023-05-30 – 2023-06-02 (×9): 200 mg via ORAL
  Filled 2023-05-30 (×9): qty 2

## 2023-05-30 MED ORDER — INSULIN ASPART 100 UNIT/ML IJ SOLN: 0.0000 [IU] | Freq: Every day | INTRAMUSCULAR | Status: DC

## 2023-05-30 MED ORDER — INSULIN ASPART 100 UNIT/ML IJ SOLN
0.0000 [IU] | Freq: Three times a day (TID) | INTRAMUSCULAR | Status: DC
  Administered 2023-05-30 (×2): 3 [IU] via SUBCUTANEOUS
  Administered 2023-05-30: 8 [IU] via SUBCUTANEOUS
  Administered 2023-05-31: 15 [IU] via SUBCUTANEOUS
  Administered 2023-05-31: 8 [IU] via SUBCUTANEOUS
  Administered 2023-06-01: 3 [IU] via SUBCUTANEOUS
  Administered 2023-06-01: 5 [IU] via SUBCUTANEOUS

## 2023-05-30 MED ORDER — INSULIN ASPART 100 UNIT/ML IJ SOLN
3.0000 [IU] | Freq: Three times a day (TID) | INTRAMUSCULAR | Status: DC
  Administered 2023-05-30 – 2023-06-02 (×9): 3 [IU] via SUBCUTANEOUS

## 2023-05-30 NOTE — Progress Notes (Signed)
 Mobility Specialist: Progress Note   05/30/23 1236  Mobility  Activity Ambulated with assistance in hallway  Level of Assistance Contact guard assist, steadying assist  Assistive Device Front wheel walker  Distance Ambulated (ft) 45 ft  Activity Response Tolerated fair  Mobility Referral Yes  Mobility visit 1 Mobility  Mobility Specialist Start Time (ACUTE ONLY) [id number]  Mobility Specialist Stop Time (ACUTE ONLY) 0930  Mobility Specialist Time Calculation (min) (ACUTE ONLY) 13 min    Pt was agreeable to mobility session - received in bed. C/o LLQ abdominal pain. SV for bed mobility. minA for boost to stand. Had a BM in bed - stood with minG while MS assisted with pericare. Continued with ambulation a few feet into the hallway and then to the chair. Very fatigued and c/o feeling SOB at EOS. SpO2 desat to 86% on RA but recovered to 90s with seated break and cues for PLB. Left in the chair with all needs met, call bell in reach.   [name] Mobility Specialist Please contact via SecureChat or Rehab office at [phone]

## 2023-05-30 NOTE — TOC Transition Note (Signed)
 Transition of Care [facility]) - Discharge Note   Patient Details  Name: [name] MRN: [id number] Date of Birth: 1950/03/20  Transition of Care [facility]) CM/SW Contact:  Ronni Colace, RN Phone Number: 05/30/2023, 9:45 AM   Clinical Narrative:     Patient walking sats revealed need for home oxygen , already had order for rolator, called [facility].they will deliver to bedside. She is set up with home health TOC signing off  Final next level of care: Home w Home Health Services Barriers to Discharge: Barriers Resolved   Patient Goals and CMS Choice Patient states their goals for this hospitalization and ongoing recovery are:: to return home          Discharge Placement                       Discharge Plan and Services Additional resources added to the After Visit Summary for     Discharge Planning Services: CM Consult            DME Arranged: Oxygen , Walker rolling with seat DME Agency: [facility] Date DME Agency Contacted: 05/30/23 Time DME Agency Contacted: [phone] Representative spoke with at DME Agency: Marlou Sims HH Arranged: PT, OT [facility] Agency: [facility] Date HH Agency Contacted: 05/29/23      Social Drivers of Health (SDOH) Interventions SDOH Screenings   Food Insecurity: Food Insecurity Present (05/26/2023)  Housing: Low Risk  (05/27/2023)  Transportation Needs: No Transportation Needs (05/26/2023)  Utilities: Not At Risk (05/26/2023)  Depression (PHQ2-9): Medium Risk (09/22/2021)  Social Connections: Patient Declined (05/27/2023)  Tobacco Use: Medium Risk (05/24/2023)     Readmission Risk Interventions    05/29/2023    4:13 PM  Readmission Risk Prevention Plan  Transportation Screening Complete  Medication Review (RN Care Manager) Complete  PCP or Specialist appointment within 3-5 days of discharge Complete  HRI or Home Care Consult Complete  SW Recovery Care/Counseling Consult Complete  Palliative Care Screening Complete   Skilled Nursing Facility Not Applicable

## 2023-05-30 NOTE — Plan of Care (Signed)
  Problem: Education: Goal: Ability to describe self-care measures that may prevent or decrease complications (Diabetes Survival Skills Education) will improve Outcome: Progressing Goal: Individualized Educational Video(s) Outcome: Progressing   Problem: Coping: Goal: Ability to adjust to condition or change in health will improve Outcome: Progressing   Problem: Fluid Volume: Goal: Ability to maintain a balanced intake and output will improve Outcome: Progressing   Problem: Health Behavior/Discharge Planning: Goal: Ability to identify and utilize available resources and services will improve Outcome: Progressing Goal: Ability to manage health-related needs will improve Outcome: Progressing   Problem: Metabolic: Goal: Ability to maintain appropriate glucose levels will improve Outcome: Progressing   Problem: Nutritional: Goal: Maintenance of adequate nutrition will improve Outcome: Progressing Goal: Progress toward achieving an optimal weight will improve Outcome: Progressing   Problem: Skin Integrity: Goal: Risk for impaired skin integrity will decrease Outcome: Progressing   Problem: Tissue Perfusion: Goal: Adequacy of tissue perfusion will improve Outcome: Progressing   Problem: Fluid Volume: Goal: Hemodynamic stability will improve Outcome: Progressing   Problem: Clinical Measurements: Goal: Diagnostic test results will improve Outcome: Progressing Goal: Signs and symptoms of infection will decrease Outcome: Progressing   Problem: Respiratory: Goal: Ability to maintain adequate ventilation will improve Outcome: Progressing   Problem: Activity: Goal: Ability to tolerate increased activity will improve Outcome: Progressing   Problem: Clinical Measurements: Goal: Ability to maintain a body temperature in the normal range will improve Outcome: Progressing   Problem: Respiratory: Goal: Ability to maintain adequate ventilation will improve Outcome:  Progressing Goal: Ability to maintain a clear airway will improve Outcome: Progressing   Problem: Education: Goal: Knowledge of General Education information will improve Description: Including pain rating scale, medication(s)/side effects and non-pharmacologic comfort measures Outcome: Progressing   Problem: Health Behavior/Discharge Planning: Goal: Ability to manage health-related needs will improve Outcome: Progressing   Problem: Clinical Measurements: Goal: Ability to maintain clinical measurements within normal limits will improve Outcome: Progressing Goal: Will remain free from infection Outcome: Progressing Goal: Diagnostic test results will improve Outcome: Progressing Goal: Respiratory complications will improve Outcome: Progressing Goal: Cardiovascular complication will be avoided Outcome: Progressing   Problem: Activity: Goal: Risk for activity intolerance will decrease Outcome: Progressing   Problem: Nutrition: Goal: Adequate nutrition will be maintained Outcome: Progressing   Problem: Coping: Goal: Level of anxiety will decrease Outcome: Progressing   Problem: Elimination: Goal: Will not experience complications related to bowel motility Outcome: Progressing Goal: Will not experience complications related to urinary retention Outcome: Progressing   Problem: Pain Managment: Goal: General experience of comfort will improve and/or be controlled Outcome: Progressing   Problem: Safety: Goal: Ability to remain free from injury will improve Outcome: Progressing   Problem: Skin Integrity: Goal: Risk for impaired skin integrity will decrease Outcome: Progressing

## 2023-05-30 NOTE — Progress Notes (Signed)
 Patient ambulated in the room x1 assist with cane to [facility] and voided prior to bladder scan. NT bladder scanned patient and she had 0ml of urine in her bladder.

## 2023-05-30 NOTE — Progress Notes (Signed)
 PROGRESS NOTE   [name]  BJY:[id number]    DOB: 1950-09-14    DOA: 05/23/2023  PCP: [facility]   I have briefly reviewed patients previous medical records in [facility].   Brief Hospital Course:   73 yo female with PMH of COPD, T2DM, HTN, anxiety, gout, HLD, GERD who presented from urgent care to ED on 05/23/2023 with complaints of shortness of breath and productive cough for 4 days after she was noted to be hypoxic at urgent care. She was negative for flu/covid at clinic.  She was noted to have oxygen  saturation of 91% and she was in respiratory distress.  She was placed on BiPAP but after some time she removed the BiPAP and refused placement.  PCCM was consulted and despite saturating at 100% on 2 L/min [place] oxygen , she had accessory muscle use and was tachypneic, rather somnolent but arousable albeit confused.  Family declined intubation and she was continued on BiPAP. 4/19, able to come off of BiPAP.              Patient transferred to progressive bed and TRH assumed care on 4/22.  Currently with intractable cough with associated lower anterior wall chest pain and abdominal pain.   Assessment & Plan:   Acute respiratory failure with hypoxia Suspected due to COPD exacerbation and presumed pneumonia Was initially on BiPAP and was able to come off of it on 4/19.  Family declined intubation Hypoxia has resolved and currently saturating in the high 90s on room air Continue supportive care Patient uncomfortable in chair complaining of almost constant intractable cough causing her to have lower anterior chest wall and upper abdominal pain.  Added Tessalon .  Community-acquired pneumonia Completed a course of IV ceftriaxone  and azithromycin . Will need follow-up chest x-ray in 4 weeks  COPD exacerbation Clinically improved. Transitioned from IV Solu-Medrol  60 mg every 12 hours to oral prednisone  40 mg daily. Continue Brovana , Pulmicort , DuoNebs as needed and  Yupelri . Incentive spirometry and flutter valve.   Septic shock, resolved Presumed secondary to pneumonia Was on Levophed  drip on 4/18 - 4/19.   Type 2 diabetes with hyperglycemia Recent A1c of 8.2% on 4/18. CBGs significantly elevated to 200s to 300s in the setting of steroid use.  At that time she was treated with Semglee  25 units twice daily, NovoLog  10 units 3 times daily, NovoLog  resistant sliding scale With transition to oral steroids, glycemic control got better, I have cut back Semglee  to 20 units daily, NovoLog  3 units 3 times daily with meals and NovoLog  moderate SSI. CBG slightly higher this morning, 258-184. Monitor closely and adjust insulins as needed.   AKI on CKD 4 Last known creatinine in February 2025 in September 2024 were in the 2.07-2.03 range.  Suspect that her baseline is in the low 2 range. Presented with creatinine of 2.39 which peaked to 2.7, likely related to shock and acute illness For the last 3 days, creatinine fluctuating in the 2.2-2.4 range but has jumped up to 3.04 on 4/24, unclear reasons.   Encourage oral fluids, check bladder scan and follow BMP in AM. Avoid nephrotoxics or hypotension.  Anemia Suspect multifactorial due to chronic disease, acute illness Gradual drift into the 9 g range. Follow CBC in AM.   Essential HTN Soft blood pressures with systolics in the 90s-100s Not on metoprolol .  Discontinued amlodipine  for now especially given the AKI.   Gout Continue allopurinol    HLD Continue atorvastatin    Anxiety  Continue  on duloxetine  and Atarax    GERD Continue Protonix    Generalized weakness In the setting of acute illness PT/OT eval and treat.  They recommend home health PT and OT    Body mass index is 28.05 kg/m.   DVT prophylaxis: heparin  injection 5,000 Units Start: 05/24/23 0600     Code Status: Full Code:  Family Communication: None at bedside Disposition:  Clinically improving.  Current barriers to discharge are  intractable cough causing chest wall and abdominal wall pain, bump in creatinine, soft blood pressures, adjusted meds as above, close monitoring clinically and BMP in a.m. and hopefully can DC home in the next 1 to 2 days.   Consultants:   PCCM  Procedures:   BiPAP  Subjective:  Sitting up in reclining chair.  Complains of intractable cough with lower anterior chest wall and upper anterior abdominal pain, sputum intermittently green in color.  Denies shortness of breath.  Objective:   Vitals:   05/30/23 0203 05/30/23 0406 05/30/23 0731 05/30/23 1141  BP: 101/70 107/85 (!) 140/72 99/75  Pulse: 78 82 84 81  Resp:  18 15 16   Temp:  97.6 F (36.4 C) 98.3 F (36.8 C) 97.8 F (36.6 C)  TempSrc:  Oral Oral Oral  SpO2: 92% 94% 96% 95%  Weight:      Height:        General exam: Elderly female, moderately built and frail lying comfortably sitting up in reclining chair as noted above. Respiratory system: Slightly harsh breath sounds bilaterally but no overt wheezing, rhonchi or crackles.  No increased work of breathing. Cardiovascular system: S1 & S2 heard, RRR. No JVD, murmurs, rubs, gallops or clicks. No pedal edema.  Telemetry personally reviewed: Sinus rhythm. Gastrointestinal system: Abdomen is nondistended, soft and nontender. No organomegaly or masses felt. Normal bowel sounds heard. Central nervous system: Alert and oriented. No focal neurological deficits. Extremities: Symmetric 5 x 5 power. Skin: No rashes, lesions or ulcers Psychiatry: Judgement and insight appear normal. Mood & affect appropriate.     Data Reviewed:   I have personally reviewed following labs and imaging studies   CBC: Recent Labs  Lab 05/23/23 1728 05/24/23 0151 05/26/23 1558 05/28/23 1044 05/30/23 0642  WBC 9.9   < > 17.1* 14.1* 18.2*  NEUTROABS 6.2  --   --   --   --   HGB 11.3*   < > 10.3* 11.5* 9.9*  HCT 36.6   < > 33.4* 36.7 31.7*  MCV 92.7   < > 92.8 90.2 91.4  PLT 296   < > 259 285  259   < > = values in this interval not displayed.    Basic Metabolic Panel: Recent Labs  Lab 05/24/23 0605 05/24/23 0942 05/25/23 0236 05/26/23 1558 05/27/23 0300 05/28/23 1044 05/30/23 0642  NA 135   < > 139 134* 137 135 134*  K 4.1   < > 4.9 5.6* 4.9 5.0 5.0  CL 101   < > 107 102 103 99 100  CO2 16*   < > 20* 18* 24 23 23   GLUCOSE 615*   < > 205* 369* 178* 218* 276*  BUN 31*   < > 29* 49* 48* 56* 74*  CREATININE 2.70*   < > 2.18* 2.40* 2.23* 2.43* 3.04*  CALCIUM  8.9   < > 9.5 9.9 10.1 9.8 8.7*  MG 2.1  --   --   --   --   --   --    < > =  values in this interval not displayed.    Liver Function Tests: Recent Labs  Lab 05/24/23 0605 05/25/23 0236 05/26/23 1558  AST 21 14* 20  ALT 10 10 12   ALKPHOS 118 86 94  BILITOT 0.5 0.4 0.5  PROT 7.6 7.1 7.5  ALBUMIN  3.2* 3.3* 3.4*    CBG: Recent Labs  Lab 05/29/23 2117 05/30/23 0744 05/30/23 1143  GLUCAP 83 258* 184*    Microbiology Studies:   Recent Results (from the past 240 hours)  Respiratory (~20 pathogens) panel by PCR     Status: None   Collection Time: 05/24/23  4:27 AM   Specimen: Nasopharyngeal Swab; Respiratory  Result Value Ref Range Status   Adenovirus NOT DETECTED NOT DETECTED Final   Coronavirus 229E NOT DETECTED NOT DETECTED Final    Comment: (NOTE) The Coronavirus on the Respiratory Panel, DOES NOT test for the novel  Coronavirus (2019 nCoV)    Coronavirus HKU1 NOT DETECTED NOT DETECTED Final   Coronavirus NL63 NOT DETECTED NOT DETECTED Final   Coronavirus OC43 NOT DETECTED NOT DETECTED Final   Metapneumovirus NOT DETECTED NOT DETECTED Final   Rhinovirus / Enterovirus NOT DETECTED NOT DETECTED Final   Influenza A NOT DETECTED NOT DETECTED Final   Influenza B NOT DETECTED NOT DETECTED Final   Parainfluenza Virus 1 NOT DETECTED NOT DETECTED Final   Parainfluenza Virus 2 NOT DETECTED NOT DETECTED Final   Parainfluenza Virus 3 NOT DETECTED NOT DETECTED Final   Parainfluenza Virus 4 NOT  DETECTED NOT DETECTED Final   Respiratory Syncytial Virus NOT DETECTED NOT DETECTED Final   Bordetella pertussis NOT DETECTED NOT DETECTED Final   Bordetella Parapertussis NOT DETECTED NOT DETECTED Final   Chlamydophila pneumoniae NOT DETECTED NOT DETECTED Final   Mycoplasma pneumoniae NOT DETECTED NOT DETECTED Final    Comment: Performed at [facility], [address] [place], [place] [address]  SARS Coronavirus 2 by RT PCR (hospital order, performed in [facility] hospital lab) *cepheid single result test* Anterior Nasal Swab     Status: None   Collection Time: 05/24/23  4:27 AM   Specimen: Anterior Nasal Swab  Result Value Ref Range Status   SARS Coronavirus 2 by RT PCR NEGATIVE NEGATIVE Final    Comment: Performed at [facility], [address], [place], [place] [address]  Culture, blood (Routine X 2) w Reflex to ID Panel     Status: None   Collection Time: 05/24/23  6:05 AM   Specimen: BLOOD  Result Value Ref Range Status   Specimen Description BLOOD BLOOD LEFT HAND  Final   Special Requests   Final    BOTTLES DRAWN AEROBIC AND ANAEROBIC Blood Culture adequate volume   Culture   Final    NO GROWTH 5 DAYS Performed at [facility], [address], [place], [place] [address]    Report Status 05/29/2023 FINAL  Final  Culture, blood (Routine X 2) w Reflex to ID Panel     Status: None   Collection Time: 05/24/23  6:05 AM   Specimen: BLOOD  Result Value Ref Range Status   Specimen Description BLOOD BLOOD LEFT HAND  Final   Special Requests   Final    BOTTLES DRAWN AEROBIC AND ANAEROBIC Blood Culture adequate volume   Culture   Final    NO GROWTH 5 DAYS Performed at [facility], [address] [place], [place] [address]    Report Status 05/29/2023 FINAL  Final  MRSA  Next Gen by PCR, Nasal     Status: None   Collection Time: 05/24/23 12:25 PM   Specimen: Nasal Mucosa; Nasal Swab  Result Value Ref Range Status   MRSA by PCR Next Gen NOT DETECTED NOT  DETECTED Final    Comment: (NOTE) The GeneXpert MRSA Assay (FDA approved for NASAL specimens only), is one component of a comprehensive MRSA colonization surveillance program. It is not intended to diagnose MRSA infection nor to guide or monitor treatment for MRSA infections. Test performance is not FDA approved in patients less than 19 years old. Performed at [facility], [address], [place], [place] [address]     Radiology Studies:  No results found.  Scheduled Meds:    allopurinol   100 mg Oral Daily   anastrozole   1 mg Oral Daily   arformoterol   15 mcg Nebulization BID   aspirin  EC  81 mg Oral Daily   atorvastatin   20 mg Oral Daily   benzonatate   200 mg Oral TID   budesonide  (PULMICORT ) nebulizer solution  0.5 mg Nebulization BID   DULoxetine   30 mg Oral Daily   feeding supplement  237 mL Oral BID BM   ferrous sulfate   325 mg Oral BID WC   gabapentin   100 mg Oral BID   And   gabapentin   200 mg Oral QHS   heparin   5,000 Units Subcutaneous Q8H   hydrOXYzine   50 mg Oral QHS   insulin  aspart  0-15 Units Subcutaneous TID WC   insulin  aspart  0-5 Units Subcutaneous QHS   insulin  aspart  3 Units Subcutaneous TID WC   insulin  glargine-yfgn  20 Units Subcutaneous Daily   lidocaine   1 patch Transdermal Q24H   pantoprazole   40 mg Oral BID   polyethylene glycol  17 g Oral Daily   predniSONE   40 mg Oral Q breakfast   revefenacin   175 mcg Nebulization Daily   sodium chloride  flush  3 mL Intravenous Q12H    Continuous Infusions:       LOS: 6 days     Aubrey Blas, MD,  FACP, [facility], [facility], [facility]   [facility] [place]      To contact the attending provider between 7A-7P or the covering provider during after hours 7P-7A, please log into the web site [email or web address] and access using universal [place] password for that web site. If you do not have the password, please call the hospital operator.  05/30/2023, 1:21 PM

## 2023-05-31 ENCOUNTER — Inpatient Hospital Stay (HOSPITAL_COMMUNITY)

## 2023-05-31 DIAGNOSIS — J9621 Acute and chronic respiratory failure with hypoxia: Secondary | ICD-10-CM

## 2023-05-31 DIAGNOSIS — J441 Chronic obstructive pulmonary disease with (acute) exacerbation: Secondary | ICD-10-CM | POA: Diagnosis not present

## 2023-05-31 DIAGNOSIS — J9601 Acute respiratory failure with hypoxia: Secondary | ICD-10-CM | POA: Diagnosis not present

## 2023-05-31 DIAGNOSIS — E861 Hypovolemia: Secondary | ICD-10-CM | POA: Diagnosis not present

## 2023-05-31 LAB — BASIC METABOLIC PANEL WITH GFR
Anion gap: 10 (ref 5–15)
BUN: 70 mg/dL — ABNORMAL HIGH (ref 8–23)
CO2: 24 mmol/L (ref 22–32)
Calcium: 8.9 mg/dL (ref 8.9–10.3)
Chloride: 102 mmol/L (ref 98–111)
Creatinine, Ser: 2.95 mg/dL — ABNORMAL HIGH (ref 0.44–1.00)
GFR, Estimated: 16 mL/min — ABNORMAL LOW (ref >60)
Glucose, Bld: 105 mg/dL — ABNORMAL HIGH (ref 70–99)
Potassium: 5 mmol/L (ref 3.5–5.1)
Sodium: 136 mmol/L (ref 135–145)

## 2023-05-31 LAB — CBC
HCT: 32.6 % — ABNORMAL LOW (ref 36.0–46.0)
Hemoglobin: 10 g/dL — ABNORMAL LOW (ref 12.0–15.0)
MCH: 28 pg (ref 26.0–34.0)
MCHC: 30.7 g/dL (ref 30.0–36.0)
MCV: 91.3 fL (ref 80.0–100.0)
Platelets: 242 10*3/uL (ref 150–400)
RBC: 3.57 MIL/uL — ABNORMAL LOW (ref 3.87–5.11)
RDW: 15.1 % (ref 11.5–15.5)
WBC: 13.6 10*3/uL — ABNORMAL HIGH (ref 4.0–10.5)
nRBC: 1.4 % — ABNORMAL HIGH (ref 0.0–0.2)

## 2023-05-31 LAB — GLUCOSE, CAPILLARY
Glucose-Capillary: 114 mg/dL — ABNORMAL HIGH (ref 70–99)
Glucose-Capillary: 382 mg/dL — ABNORMAL HIGH (ref 70–99)
Glucose-Capillary: 285 mg/dL — ABNORMAL HIGH (ref 70–99)
Glucose-Capillary: 147 mg/dL — ABNORMAL HIGH (ref 70–99)

## 2023-05-31 LAB — D-DIMER, QUANTITATIVE: D-Dimer, Quant: 0.48 ug{FEU}/mL (ref 0.00–0.50)

## 2023-05-31 MED ORDER — GLUCERNA SHAKE PO LIQD
237.0000 mL | Freq: Two times a day (BID) | ORAL | Status: DC
  Administered 2023-06-01 – 2023-06-02 (×2): 237 mL via ORAL

## 2023-05-31 MED ORDER — HYDROCOD POLI-CHLORPHE POLI ER 10-8 MG/5ML PO SUER: 5.0000 mL | Freq: Two times a day (BID) | ORAL | Status: DC | PRN

## 2023-05-31 MED ORDER — GLIPIZIDE 5 MG PO TABS
5.0000 mg | ORAL_TABLET | Freq: Every day | ORAL | Status: DC
  Administered 2023-05-31 – 2023-06-02 (×3): 5 mg via ORAL
  Filled 2023-05-31 (×4): qty 1

## 2023-05-31 NOTE — Progress Notes (Signed)
 Progress Note   Patient: [name] AOZ:[id number] DOB: [birth year] DOA: 05/23/2023     7 DOS: the patient was seen and examined on 05/31/2023 at 10:33AM and 3:30PM      Brief hospital course: 73 y.o. F with BrCA s/p mastectomy, CAD s/p remote PCI, CKD IV baseline Cr 2.1-2.6, anxiety and HTN who presented with few days cough and SOB.  Chest imaging showed LLL pneumonia.    After admission, she did have distress requiring CCM consultation and BiPAP for 2 days.  This resolved and she was transferred back to medical team on hospital day 4  Since then, she is weaned to room air at rest, but creatinine trending up and patient severely limited by left rib pain.     Assessment and Plan: Acute respiratory failure with hypoxia due to septic shock due to community-acquired pneumonia, left lower lobe     Acute kidney injury on CKD stage IV Cr baseline appears to have trended up to low 2s in the last few years.  Here, it is worse to >3 yesterday - Stop losartan  now and at discharge - Push oral fluids - Correct hyperglycemia - Avoid hypotension or nephrotoxins   Type 2 diabetes with hyperglycemia Hyperglycemic crisis Not on insulin  or other antidiabetes agents as an outpatient, and outpatient hemoglobin A1c 8.3%. Glucoses >600 on admission, without DKA.  Resolved but still with severe hyperglycemia in response to systemic steroids. - Stop steroids - Continue glargine for now - Start glipizide  - Replace Ensure with Glucerna - Trend glucoses - If improving tomorrow, will d/c with glipizide  alone or glipizide /glargine insulin  at discharge - Continue gabapentin  for diabetic neuropathy   Left rib pain Patient reports a "knot" in her left side (points to left midaxillary costal margin) worse with movement, palpation, cough.  CT on admission showed no mass, rib fracture, or other finding.  CXR repeated 4/25 showed no rib fracture or effusion.  Suspect this is muscle pain - Analgesics -  Hot pad  I have a low suspicion for chest pain from PE, but given degree of symptoms (SOB, chest pain) and persistent hypoxia despite normal lung exam and near normal CXR, I think this needs to be ruled out - Dimer - US  doppler bilateral legs - NM perfusion study if dimer positive but doppler negative    COPD ruled out Wheezing on admission, but susequently resolved.  PFTs in 2013 and 2019 both did not show COPD. - Stop steroids   Anemia of chronic kidney disease Cr stable relative to baseline  Coronary disease Hypertension Hyperlipidemia BP remains labile and low. - Hold metoprolol , amlodipine  - Stop losartan  - Continue aspirin , Lipitor  Breast cancer In remission - Repeat CXR in 4-6 weeks  Mood disorder - Continue Cymbalta        Subjective: Patient with severe left rib pain, barely able to get out of bed.  Quite weak.  No fever, no confusion, no wheezing.     Physical Exam: BP 102/85 (BP Location: Left Arm)   Pulse 82   Temp 97.7 F (36.5 C)   Resp 17   Ht 4\' 11"  (1.499 m)   Wt 63 kg   LMP  (LMP Unknown) Comment: tubal ligation  SpO2 93%   BMI 28.05 kg/m   Adult female, sitting up in recliner, interactive and appropriate RRR, no murmurs, no peripheral edema, no JVD Respiratory rate seems increased, lung sounds diminished, no rales or wheezing Abdomen soft without tenderness palpation, there is exquisite tenderness over the  left ribs in the midaxillary line, but no palpable mass Attention normal, affect anxious, judgment and insight appear normal, face symmetric, speech fluent    Data Reviewed: Chest x-ray, personally reviewed, shows no pleural effusion, official read shows no radiographic rib fracture CBC shows white count improved to 13, hemoglobin stable at 10 Basic metabolic panel shows creatinine stable at 2.95, sodium up to 136   Family Communication: Sister by phone    Disposition: Status is: Inpatient Patient was admitted for  respiratory failure, which has been presumed to be from pneumonia.  At present she has severe pain that has not really been addressed, as well as severe hyperglycemia and worsening renal failure.  Today, will rule out PE and try to tame sugars.  If PE ruled out and sugars better on glipizide , possibly home tomorrow or Sunday        Author: Ephriam Hashimoto, MD 05/31/2023 3:58 PM  For on call review [email or web address].

## 2023-05-31 NOTE — Plan of Care (Signed)
  Problem: Education: Goal: Ability to describe self-care measures that may prevent or decrease complications (Diabetes Survival Skills Education) will improve Outcome: Progressing Goal: Individualized Educational Video(s) Outcome: Progressing   Problem: Coping: Goal: Ability to adjust to condition or change in health will improve Outcome: Progressing   Problem: Fluid Volume: Goal: Ability to maintain a balanced intake and output will improve Outcome: Progressing   Problem: Health Behavior/Discharge Planning: Goal: Ability to identify and utilize available resources and services will improve Outcome: Progressing Goal: Ability to manage health-related needs will improve Outcome: Progressing   Problem: Metabolic: Goal: Ability to maintain appropriate glucose levels will improve Outcome: Progressing   Problem: Nutritional: Goal: Maintenance of adequate nutrition will improve Outcome: Progressing Goal: Progress toward achieving an optimal weight will improve Outcome: Progressing   Problem: Skin Integrity: Goal: Risk for impaired skin integrity will decrease Outcome: Progressing   Problem: Tissue Perfusion: Goal: Adequacy of tissue perfusion will improve Outcome: Progressing   Problem: Fluid Volume: Goal: Hemodynamic stability will improve Outcome: Progressing   Problem: Clinical Measurements: Goal: Diagnostic test results will improve Outcome: Progressing Goal: Signs and symptoms of infection will decrease Outcome: Progressing   Problem: Respiratory: Goal: Ability to maintain adequate ventilation will improve Outcome: Progressing   Problem: Activity: Goal: Ability to tolerate increased activity will improve Outcome: Progressing   Problem: Clinical Measurements: Goal: Ability to maintain a body temperature in the normal range will improve Outcome: Progressing   Problem: Respiratory: Goal: Ability to maintain adequate ventilation will improve Outcome:  Progressing Goal: Ability to maintain a clear airway will improve Outcome: Progressing   Problem: Education: Goal: Knowledge of General Education information will improve Description: Including pain rating scale, medication(s)/side effects and non-pharmacologic comfort measures Outcome: Progressing   Problem: Health Behavior/Discharge Planning: Goal: Ability to manage health-related needs will improve Outcome: Progressing   Problem: Clinical Measurements: Goal: Ability to maintain clinical measurements within normal limits will improve Outcome: Progressing Goal: Will remain free from infection Outcome: Progressing Goal: Diagnostic test results will improve Outcome: Progressing Goal: Respiratory complications will improve Outcome: Progressing Goal: Cardiovascular complication will be avoided Outcome: Progressing   Problem: Activity: Goal: Risk for activity intolerance will decrease Outcome: Progressing   Problem: Nutrition: Goal: Adequate nutrition will be maintained Outcome: Progressing   Problem: Coping: Goal: Level of anxiety will decrease Outcome: Progressing   Problem: Elimination: Goal: Will not experience complications related to bowel motility Outcome: Progressing Goal: Will not experience complications related to urinary retention Outcome: Progressing   Problem: Pain Managment: Goal: General experience of comfort will improve and/or be controlled Outcome: Progressing   Problem: Safety: Goal: Ability to remain free from injury will improve Outcome: Progressing   Problem: Skin Integrity: Goal: Risk for impaired skin integrity will decrease Outcome: Progressing

## 2023-05-31 NOTE — TOC Progression Note (Signed)
 Transition of Care [facility]) - Progression Note    Patient Details  Name: [name] MRN: [id number] Date of Birth: 03-19-1950  Transition of [facility]) CM/SW Contact  Ronni Colace, RN Phone Number: 05/31/2023, 5:01 PM  Clinical Narrative:    Continues to require hospitalization, Chest xray now with possible pneumonia, ruling out PE. May DC Sunday the patient has oxygen  and DME set up already   Expected Discharge Plan: Home w Home Health Services Barriers to Discharge: Barriers Resolved  Expected Discharge Plan and Services   Discharge Planning Services: CM Consult   Living arrangements for the past 2 months: Single Family Home                 DME Arranged: Oxygen , Walker rolling with seat DME Agency: [facility] Date DME Agency Contacted: 05/30/23 Time DME Agency Contacted: [phone] Representative spoke with at DME Agency: Marlou Sims HH Arranged: PT, OT [facility] Agency: [facility] Date HH Agency Contacted: 05/29/23       Social Determinants of Health (SDOH) Interventions SDOH Screenings   Food Insecurity: Food Insecurity Present (05/26/2023)  Housing: Low Risk  (05/27/2023)  Transportation Needs: No Transportation Needs (05/26/2023)  Utilities: Not At Risk (05/26/2023)  Depression (PHQ2-9): Medium Risk (09/22/2021)  Social Connections: Patient Declined (05/27/2023)  Tobacco Use: Medium Risk (05/24/2023)    Readmission Risk Interventions    05/29/2023    4:13 PM  Readmission Risk Prevention Plan  Transportation Screening Complete  Medication Review (RN Care Manager) Complete  PCP or Specialist appointment within 3-5 days of discharge Complete  HRI or Home Care Consult Complete  SW Recovery Care/Counseling Consult Complete  Palliative Care Screening Complete  Skilled Nursing Facility Not Applicable

## 2023-05-31 NOTE — Hospital Course (Signed)
 73 y.o. F with BrCA s/p mastectomy, CAD s/p remote PCI, CKD IV baseline Cr 2.1-2.6, anxiety and HTN who presented with few days cough and SOB.  Chest imaging showed LLL pneumonia.    After admission, she did have distress requiring CCM consultation and BiPAP for 2 days.  This resolved and she was transferred back to medical team on hospital day 4  Since then, she is weaned to room air at rest, but creatinine trending up and patient severely limited by left rib pain.

## 2023-05-31 NOTE — Progress Notes (Signed)
 Physical Therapy Treatment Patient Details Name: [name] MRN: [id number] DOB: [birth year] Today's Date: 05/31/2023   History of Present Illness Pt is 73 year old presented to [facility] from urgent care on  05/23/23 for SOB. Pt with copd exacerbation and septic shock due to presumed PNA. Pt required bipap.  Pt with costocondral pain and c/o anterior LLQ pain mostly.  PMH - breast CA, copd, cad, DM, HTN, ckd, lt shoulder replacement, anxiety, gout.    PT Comments  Pt received in supine, agreeable to therapy session with encouragement, pt c/o LLQ abdominal pain. Pt needing up to maxA for bed mobility from flat bed without rails to simulate home setup, partially due to pain/guarding, and up to minA for gait with rollator, as pt less steady with backward stepping. Pt min to modA for sit<>stand from EOB to rollator. Rollator handles adjusted for proper height. Pt SpO2 WFL on RA with exertional tasks this date. Pt continues to benefit from PT services to progress toward functional mobility goals.    If plan is discharge home, recommend the following: A lot of help with bathing/dressing/bathroom;Assist for transportation;Help with stairs or ramp for entrance;A little help with walking and/or transfers;Assistance with cooking/housework   Can travel by Pension scheme manager (4 wheels)    Recommendations for Other Services       Precautions / Restrictions Precautions Precautions: Fall;Other (comment) Recall of Precautions/Restrictions: Impaired Precaution/Restrictions Comments: watch SpO2 Restrictions Weight Bearing Restrictions Per Provider Order: No     Mobility  Bed Mobility Overal bed mobility: Needs Assistance Bed Mobility: Supine to Sit, Sit to Supine     Supine to sit: Max assist Sit to supine: Mod assist   General bed mobility comments: from flat bed without rails, pt requiring significantly increased assist to raise trunk, dense cues for  moving BLE to EOB. Pt reports she sleeps in a regular queen bed at home. Severe LLQ pain with pt returning to supine from EOB, needing BLE assist    Transfers Overall transfer level: Needs assistance Equipment used: Rollator (4 wheels) Transfers: Sit to/from Stand Sit to Stand: Min assist, Mod assist           General transfer comment: EOB>rollator with modA first trial, then sat down to rest, then minA second trial after rollator handles adjusted to proper height.    Ambulation/Gait Ambulation/Gait assistance: Contact guard assist, Min assist Gait Distance (Feet): 75 Feet Assistive device: Rollator (4 wheels) Gait Pattern/deviations: Step-through pattern, Drifts right/left, Decreased stride length, Shuffle, Narrow base of support       General Gait Details: Low foot clearance, short step length, mostly CGA for forward stepping, but needing up to minA when turning and stepping backward and laterally. SpO2 94% and greater on RA with exertion, HR 70's-80's bpm.   Stairs             Wheelchair Mobility     Tilt Bed    Modified Rankin (Stroke Patients Only)       Balance Overall balance assessment: Needs assistance Sitting-balance support: No upper extremity supported, Feet supported Sitting balance-Leahy Scale: Fair     Standing balance support: Bilateral upper extremity supported, During functional activity, Reliant on assistive device for balance Standing balance-Leahy Scale: Poor Standing balance comment: fair static standing but poor dynamic standing even with AD  Communication Communication Communication: Impaired Factors Affecting Communication: Hearing impaired (Hoarse voice)  Cognition Arousal: Alert Behavior During Therapy: WFL for tasks assessed/performed   PT - Cognitive impairments: Safety/Judgement, Sequencing, Problem solving                       PT - Cognition Comments: Slow processing but  cooperative as able. Following commands: Intact      Cueing Cueing Techniques: Verbal cues, Tactile cues, Gestural cues  Exercises      General Comments        Pertinent Vitals/Pain Pain Assessment Pain Assessment: Faces Faces Pain Scale: Hurts even more Pain Location: LLQ pain ~2" to L of her belly button Pain Descriptors / Indicators: Discomfort, Grimacing, Guarding, Sharp Pain Intervention(s): Limited activity within patient's tolerance, Monitored during session, Premedicated before session, Repositioned    Home Living                          Prior Function            PT Goals (current goals can now be found in the care plan section) Acute Rehab PT Goals Patient Stated Goal: To go home PT Goal Formulation: With patient Time For Goal Achievement: 06/09/23 Progress towards PT goals: Progressing toward goals    Frequency    Min 2X/week      PT Plan      Co-evaluation              AM-PAC PT "6 Clicks" Mobility   Outcome Measure  Help needed turning from your back to your side while in a flat bed without using bedrails?: A Little Help needed moving from lying on your back to sitting on the side of a flat bed without using bedrails?: A Lot Help needed moving to and from a bed to a chair (including a wheelchair)?: A Little Help needed standing up from a chair using your arms (e.g., wheelchair or bedside chair)?: A Lot Help needed to walk in hospital room?: A Little Help needed climbing 3-5 steps with a railing? : A Lot 6 Click Score: 15    End of Session Equipment Utilized During Treatment: Gait belt Activity Tolerance: Patient tolerated treatment well;Other (comment) (abd pain) Patient left: in bed;with call bell/phone within reach;with bed alarm set;Other (comment) (HOB elevated for pt comfort) Nurse Communication: Mobility status;Other (comment) (abd pain) PT Visit Diagnosis: Unsteadiness on feet (R26.81);Other abnormalities of gait and  mobility (R26.89);Muscle weakness (generalized) (M62.81);Pain Pain - Right/Left: Right Pain - part of body:  (LLQ abdomen)     Time: 1655-1710 PT Time Calculation (min) (ACUTE ONLY): 15 min  Charges:    $Gait Training: 8-22 mins PT General Charges $$ ACUTE PT VISIT: 1 Visit                     Airi Copado P., PTA Acute Rehabilitation Services Secure Chat Preferred 9a-5:30pm Office: [phone]    Arville Laughter 05/31/2023, 5:55 PM

## 2023-05-31 NOTE — Progress Notes (Signed)
 Dimer negative.  Rules out PE as a cause of rib pain.  Still suspect this is muscle pain from coughing.  Doubt intraabdominal pain.

## 2023-05-31 NOTE — Progress Notes (Signed)
 Mobility Specialist: Progress Note   05/31/23 1130  Mobility  Activity Ambulated with assistance in hallway  Level of Assistance Contact guard assist, steadying assist  Assistive Device Four wheel walker  Distance Ambulated (ft) 90 ft  Activity Response Tolerated fair  Mobility Referral Yes  Mobility visit 1 Mobility  Mobility Specialist Start Time (ACUTE ONLY) 0932  Mobility Specialist Stop Time (ACUTE ONLY) 0956  Mobility Specialist Time Calculation (min) (ACUTE ONLY) 24 min    During Mobility: SpO2 89-92% 1LO2  Post Mobility: SpO2 93-95% RA  Pt was agreeable to mobility session - received in bed. C/o severe LLQ abdominal pain. SV for bed mobility. Light minA to STS. MinG for ambulation. 2x occurrence of unsteadiness but no overt LOB. Very fatigued midway through ambulation. SpO2 WFL on 1LO2. Returned to room without fault. Left in chair on RA with all needs met, call bell in reach.   Deloria Fetch Mobility Specialist Please contact via SecureChat or Rehab office at [phone]

## 2023-06-01 ENCOUNTER — Inpatient Hospital Stay (HOSPITAL_COMMUNITY)

## 2023-06-01 DIAGNOSIS — J441 Chronic obstructive pulmonary disease with (acute) exacerbation: Secondary | ICD-10-CM | POA: Diagnosis not present

## 2023-06-01 LAB — CBC
HCT: 28.2 % — ABNORMAL LOW (ref 36.0–46.0)
Hemoglobin: 9 g/dL — ABNORMAL LOW (ref 12.0–15.0)
MCH: 29.1 pg (ref 26.0–34.0)
MCHC: 31.9 g/dL (ref 30.0–36.0)
MCV: 91.3 fL (ref 80.0–100.0)
Platelets: 217 10*3/uL (ref 150–400)
RBC: 3.09 MIL/uL — ABNORMAL LOW (ref 3.87–5.11)
RDW: 15.1 % (ref 11.5–15.5)
WBC: 15 10*3/uL — ABNORMAL HIGH (ref 4.0–10.5)
nRBC: 0.5 % — ABNORMAL HIGH (ref 0.0–0.2)

## 2023-06-01 LAB — COMPREHENSIVE METABOLIC PANEL WITH GFR
ALT: 15 U/L (ref 0–44)
AST: 15 U/L (ref 15–41)
Albumin: 2.9 g/dL — ABNORMAL LOW (ref 3.5–5.0)
Alkaline Phosphatase: 75 U/L (ref 38–126)
Anion gap: 11 (ref 5–15)
BUN: 60 mg/dL — ABNORMAL HIGH (ref 8–23)
CO2: 24 mmol/L (ref 22–32)
Calcium: 9.1 mg/dL (ref 8.9–10.3)
Chloride: 100 mmol/L (ref 98–111)
Creatinine, Ser: 2.52 mg/dL — ABNORMAL HIGH (ref 0.44–1.00)
GFR, Estimated: 19 mL/min — ABNORMAL LOW (ref >60)
Glucose, Bld: 217 mg/dL — ABNORMAL HIGH (ref 70–99)
Potassium: 5 mmol/L (ref 3.5–5.1)
Sodium: 135 mmol/L (ref 135–145)
Total Bilirubin: 0.7 mg/dL (ref 0.0–1.2)
Total Protein: 6 g/dL — ABNORMAL LOW (ref 6.5–8.1)

## 2023-06-01 LAB — GLUCOSE, CAPILLARY
Glucose-Capillary: 230 mg/dL — ABNORMAL HIGH (ref 70–99)
Glucose-Capillary: 155 mg/dL — ABNORMAL HIGH (ref 70–99)
Glucose-Capillary: 79 mg/dL (ref 70–99)
Glucose-Capillary: 104 mg/dL — ABNORMAL HIGH (ref 70–99)

## 2023-06-01 NOTE — Progress Notes (Signed)
 Mobility Specialist Progress Note:    06/01/23 1000  Mobility  Activity Ambulated with assistance in hallway  Level of Assistance Contact guard assist, steadying assist  Assistive Device Four wheel walker  Distance Ambulated (ft) 90 ft  Activity Response Tolerated fair;Tolerated well  Mobility Referral Yes  Mobility visit 1 Mobility  Mobility Specialist Start Time (ACUTE ONLY) [id number]  Mobility Specialist Stop Time (ACUTE ONLY) 1001  Mobility Specialist Time Calculation (min) (ACUTE ONLY) 19 min   Pt received in bed and agreeable. C/o LLQ pain throughout. Took x1 seated rest break. Returned to room w/o fault. Pt left in chair with call bell and all needs met.  D'Vante Nolon Baxter Mobility Specialist Please contact via Special educational needs teacher or Rehab office at [phone]

## 2023-06-01 NOTE — Progress Notes (Signed)
 PROGRESS NOTE   [name]  UEA:[id number]    DOB: 09-02-1950    DOA: 05/23/2023  PCP: [facility]   I have briefly reviewed patients previous medical [facility] in [facility].   Brief Hospital Course:  As documented by Dr. Sherrod Dolphin: "73 yo female with PMH of COPD, T2DM, HTN, anxiety, gout, HLD, GERD who presented from urgent care to ED on 05/23/2023 with complaints of shortness of breath and productive cough for 4 days after she was noted to be hypoxic at urgent care. She was negative for flu/covid at clinic.  She was noted to have oxygen  saturation of 91% and she was in respiratory distress.  She was placed on BiPAP but after some time she removed the BiPAP and refused placement.  PCCM was consulted and despite saturating at 100% on 2 L/min [place] oxygen , she had accessory muscle use and was tachypneic, rather somnolent but arousable albeit confused.  Family declined intubation and she was continued on BiPAP. 4/19, able to come off of BiPAP.              Patient transferred to progressive bed and TRH assumed care on 4/22.  Currently with intractable cough with associated lower anterior wall chest pain and abdominal pain".  06/01/2023: Patient seen.  Significant abdominal distention noted (query gaseous distention).  Will obtain a stat abdominal x-ray.  Respiratory symptoms have improved significantly.  Further management will be dependent above.  Plan is to eventually discharge patient home with PT OT.   Assessment & Plan:   Acute respiratory failure with hypoxia Suspected due to COPD exacerbation and presumed pneumonia Was initially on BiPAP and was able to come off of it on 4/19.  Family declined intubation Hypoxia has resolved and currently saturating in the high 90s on room air Continue supportive care Patient uncomfortable in chair complaining of almost constant intractable cough causing her to have lower anterior chest wall and upper abdominal pain.  Added  Tessalon . 06/01/2023: Continue current management.  Seems to have improved significantly.  Community-acquired pneumonia Completed a course of IV ceftriaxone  and azithromycin . Will need follow-up chest x-ray in 4 weeks  COPD exacerbation Clinically improved. Transitioned from IV Solu-Medrol  60 mg every 12 hours to oral prednisone  40 mg daily. Continue Brovana , Pulmicort , DuoNebs as needed and Yupelri . Incentive spirometry and flutter valve. 06/01/2023: Continue current management.   Septic shock, resolved Presumed secondary to pneumonia Was on Levophed  drip on 4/18 - 4/19. 06/01/2023: Continue to monitor blood pressure closely.  Systolic blood pressure in the 90s.   Type 2 diabetes with hyperglycemia Recent A1c of 8.2% on 4/18. CBGs significantly elevated to 200s to 300s in the setting of steroid use.  At that time she was treated with Semglee  25 units twice daily, NovoLog  10 units 3 times daily, NovoLog  resistant sliding scale With transition to oral steroids, glycemic control got better, I have cut back Semglee  to 20 units daily, NovoLog  3 units 3 times daily with meals and NovoLog  moderate SSI. CBG slightly higher this morning, 258-184. Monitor closely and adjust insulins as needed.   AKI on CKD 4 Last known creatinine in February 2025 in September 2024 were in the 2.07-2.03 range.  Suspect that her baseline is in the low 2 range. Presented with creatinine of 2.39 which peaked to 2.7, likely related to shock and acute illness For the last 3 days, creatinine fluctuating in the 2.2-2.4 range but has jumped up to 3.04 on 4/24, unclear reasons.  Encourage oral fluids, check bladder scan and follow BMP in AM. Avoid nephrotoxics or hypotension. 06/01/2023: CMP done today revealed sodium of 135, potassium of 5, CO2 24, BUN of 16 and serum creatinine of 2.52.  EGFR of 11 mL/min per 1.73 m.  .  Anemia Suspect multifactorial due to chronic disease, acute illness Gradual drift into the 9 g  range. Follow CBC in AM. 06/01/2023: Hemoglobin is stable.   Essential HTN Soft blood pressures with systolics in the 90s-100s Not on metoprolol .  Discontinued amlodipine  for now especially given the AKI.   Gout Continue allopurinol    HLD Continue atorvastatin    Anxiety  Continue on duloxetine  and Atarax    GERD Continue Protonix    Generalized weakness In the setting of acute illness PT/OT eval and treat.  They recommend home health PT and OT    Body mass index is 28.05 kg/m.   DVT prophylaxis: heparin  injection 5,000 Units Start: 05/24/23 0600     Code Status: Full Code:  Family Communication: None at bedside Disposition:  Clinically improving.  Current barriers to discharge are intractable cough causing chest wall and abdominal wall pain, bump in creatinine, soft blood pressures, adjusted meds as above, close monitoring clinically and BMP in a.m. and hopefully can DC home in the next 1 to 2 days.   Consultants:   PCCM  Procedures:   BiPAP  Subjective:  Patient seen. Abdominal distention noted.  Objective:   Vitals:   05/31/23 1503 05/31/23 2021 06/01/23 0528 06/01/23 0738  BP: 102/85 125/76 100/68 91/63  Pulse: 82 84 91 85  Resp: 17 18 19 18   Temp: 97.7 F (36.5 C) 98.6 F (37 C) 98.9 F (37.2 C) 98.4 F (36.9 C)  TempSrc:  Oral Oral Oral  SpO2: 93% 96% 98% 98%  Weight:      Height:        General exam: Not in any distress.  Awake and alert.  Patient is pale. Respiratory system: Clear to auscultation. Cardiovascular system: S1 & S2 heard Gastrointestinal system: Distended abdomen, gaseous.   Central nervous system: Awake and alert.   Extremities:   Data Reviewed:   I have personally reviewed following labs and imaging studies   CBC: Recent Labs  Lab 05/30/23 0642 05/31/23 0722 06/01/23 0750  WBC 18.2* 13.6* 15.0*  HGB 9.9* 10.0* 9.0*  HCT 31.7* 32.6* 28.2*  MCV 91.4 91.3 91.3  PLT 259 242 217    Basic Metabolic Panel: Recent  Labs  Lab 05/27/23 0300 05/28/23 1044 05/30/23 0642 05/31/23 0722 06/01/23 0750  NA 137 135 134* 136 135  K 4.9 5.0 5.0 5.0 5.0  CL 103 99 100 102 100  CO2 24 23 23 24 24   GLUCOSE 178* 218* 276* 105* 217*  BUN 48* 56* 74* 70* 60*  CREATININE 2.23* 2.43* 3.04* 2.95* 2.52*  CALCIUM  10.1 9.8 8.7* 8.9 9.1    Liver Function Tests: Recent Labs  Lab 05/26/23 1558 06/01/23 0750  AST 20 15  ALT 12 15  ALKPHOS 94 75  BILITOT 0.5 0.7  PROT 7.5 6.0*  ALBUMIN  3.4* 2.9*    CBG: Recent Labs  Lab 05/31/23 1504 05/31/23 2052 06/01/23 0736  GLUCAP 285* 147* 230*    Microbiology Studies:   Recent Results (from the past 240 hours)  Respiratory (~20 pathogens) panel by PCR     Status: None   Collection Time: 05/24/23  4:27 AM   Specimen: Nasopharyngeal Swab; Respiratory  Result Value Ref Range Status   Adenovirus NOT  DETECTED NOT DETECTED Final   Coronavirus 229E NOT DETECTED NOT DETECTED Final    Comment: (NOTE) The Coronavirus on the Respiratory Panel, DOES NOT test for the novel  Coronavirus (2019 nCoV)    Coronavirus HKU1 NOT DETECTED NOT DETECTED Final   Coronavirus NL63 NOT DETECTED NOT DETECTED Final   Coronavirus OC43 NOT DETECTED NOT DETECTED Final   Metapneumovirus NOT DETECTED NOT DETECTED Final   Rhinovirus / Enterovirus NOT DETECTED NOT DETECTED Final   Influenza A NOT DETECTED NOT DETECTED Final   Influenza B NOT DETECTED NOT DETECTED Final   Parainfluenza Virus 1 NOT DETECTED NOT DETECTED Final   Parainfluenza Virus 2 NOT DETECTED NOT DETECTED Final   Parainfluenza Virus 3 NOT DETECTED NOT DETECTED Final   Parainfluenza Virus 4 NOT DETECTED NOT DETECTED Final   Respiratory Syncytial Virus NOT DETECTED NOT DETECTED Final   Bordetella pertussis NOT DETECTED NOT DETECTED Final   Bordetella Parapertussis NOT DETECTED NOT DETECTED Final   Chlamydophila pneumoniae NOT DETECTED NOT DETECTED Final   Mycoplasma pneumoniae NOT DETECTED NOT DETECTED Final     Comment: Performed at [facility], [address], [place], [place] [address]  SARS Coronavirus 2 by RT PCR (hospital order, performed in [facility] hospital lab) *cepheid single result test* Anterior Nasal Swab     Status: None   Collection Time: 05/24/23  4:27 AM   Specimen: Anterior Nasal Swab  Result Value Ref Range Status   SARS Coronavirus 2 by RT PCR NEGATIVE NEGATIVE Final    Comment: Performed at [facility], [address] [place], [place] [address]  Culture, blood (Routine X 2) w Reflex to ID Panel     Status: None   Collection Time: 05/24/23  6:05 AM   Specimen: BLOOD  Result Value Ref Range Status   Specimen Description BLOOD BLOOD LEFT HAND  Final   Special Requests   Final    BOTTLES DRAWN AEROBIC AND ANAEROBIC Blood Culture adequate volume   Culture   Final    NO GROWTH 5 DAYS Performed at [facility], [address] [place], [place] [address]    Report Status 05/29/2023 FINAL  Final  Culture, blood (Routine X 2) w Reflex to ID Panel     Status: None   Collection Time: 05/24/23  6:05 AM   Specimen: BLOOD  Result Value Ref Range Status   Specimen Description BLOOD BLOOD LEFT HAND  Final   Special Requests   Final    BOTTLES DRAWN AEROBIC AND ANAEROBIC Blood Culture adequate volume   Culture   Final    NO GROWTH 5 DAYS Performed at [facility], [address], [place], [place] [address]    Report Status 05/29/2023 FINAL  Final  MRSA Next Gen by PCR, Nasal     Status: None   Collection Time: 05/24/23 12:25 PM   Specimen: Nasal Mucosa; Nasal Swab  Result Value Ref Range Status   MRSA by PCR Next Gen NOT DETECTED NOT DETECTED Final    Comment: (NOTE) The GeneXpert MRSA Assay (FDA approved for NASAL specimens only), is one component of a comprehensive MRSA colonization surveillance program. It is not intended to diagnose MRSA infection nor to guide or monitor treatment for MRSA infections. Test performance is not FDA approved in  patients less than 15 years old. Performed at [facility], [address], [place], [place] [address]     Radiology Studies:  DG Chest 2  View Result Date: 05/31/2023 CLINICAL DATA:  Left-sided chest pain and shortness of breath EXAM: CHEST - 2 VIEW COMPARISON:  05/23/2023 FINDINGS: Frontal and lateral views of the chest demonstrate unremarkable cardiac silhouette. Streaky left lower lobe consolidation may reflect pneumonia or atelectasis. No effusion or pneumothorax. Stable left shoulder arthroplasty. IMPRESSION: 1. Streaky left lower lobe consolidation may reflect atelectasis or pneumonia. Electronically Signed   By: Bobbye Burrow M.D.   On: 05/31/2023 15:17    Scheduled Meds:    allopurinol   100 mg Oral Daily   anastrozole   1 mg Oral Daily   aspirin  EC  81 mg Oral Daily   atorvastatin   20 mg Oral Daily   benzonatate   200 mg Oral TID   DULoxetine   30 mg Oral Daily   feeding supplement (GLUCERNA SHAKE)  237 mL Oral BID BM   ferrous sulfate   325 mg Oral BID WC   gabapentin   100 mg Oral BID   And   gabapentin   200 mg Oral QHS   glipiZIDE   5 mg Oral QAC breakfast   heparin   5,000 Units Subcutaneous Q8H   hydrOXYzine   50 mg Oral QHS   insulin  aspart  0-15 Units Subcutaneous TID WC   insulin  aspart  0-5 Units Subcutaneous QHS   insulin  aspart  3 Units Subcutaneous TID WC   insulin  glargine-yfgn  20 Units Subcutaneous Daily   lidocaine   1 patch Transdermal Q24H   pantoprazole   40 mg Oral BID   polyethylene glycol  17 g Oral Daily   sodium chloride  flush  3 mL Intravenous Q12H    Continuous Infusions:       LOS: 8 days     Doroteo Gasmen, MD,   To contact the attending provider between 7A-7P or the covering provider during after hours 7P-7A, please log into the web site [email or web address] and access using universal [place] password for that web site. If you do not have the password, please call the hospital operator.  06/01/2023, 10:31 AM

## 2023-06-01 NOTE — Plan of Care (Signed)
  Problem: Education: Goal: Ability to describe self-care measures that may prevent or decrease complications (Diabetes Survival Skills Education) will improve Outcome: Progressing Goal: Individualized Educational Video(s) Outcome: Progressing   Problem: Coping: Goal: Ability to adjust to condition or change in health will improve Outcome: Progressing   Problem: Fluid Volume: Goal: Ability to maintain a balanced intake and output will improve Outcome: Progressing   Problem: Health Behavior/Discharge Planning: Goal: Ability to identify and utilize available resources and services will improve Outcome: Progressing Goal: Ability to manage health-related needs will improve Outcome: Progressing   Problem: Metabolic: Goal: Ability to maintain appropriate glucose levels will improve Outcome: Progressing   Problem: Nutritional: Goal: Maintenance of adequate nutrition will improve Outcome: Progressing Goal: Progress toward achieving an optimal weight will improve Outcome: Progressing   Problem: Skin Integrity: Goal: Risk for impaired skin integrity will decrease Outcome: Progressing   Problem: Tissue Perfusion: Goal: Adequacy of tissue perfusion will improve Outcome: Progressing   Problem: Fluid Volume: Goal: Hemodynamic stability will improve Outcome: Progressing   Problem: Clinical Measurements: Goal: Diagnostic test results will improve Outcome: Progressing Goal: Signs and symptoms of infection will decrease Outcome: Progressing   Problem: Respiratory: Goal: Ability to maintain adequate ventilation will improve Outcome: Progressing   Problem: Activity: Goal: Ability to tolerate increased activity will improve Outcome: Progressing   Problem: Clinical Measurements: Goal: Ability to maintain a body temperature in the normal range will improve Outcome: Progressing   Problem: Respiratory: Goal: Ability to maintain adequate ventilation will improve Outcome:  Progressing Goal: Ability to maintain a clear airway will improve Outcome: Progressing   Problem: Education: Goal: Knowledge of General Education information will improve Description: Including pain rating scale, medication(s)/side effects and non-pharmacologic comfort measures Outcome: Progressing   Problem: Health Behavior/Discharge Planning: Goal: Ability to manage health-related needs will improve Outcome: Progressing   Problem: Clinical Measurements: Goal: Ability to maintain clinical measurements within normal limits will improve Outcome: Progressing Goal: Will remain free from infection Outcome: Progressing Goal: Diagnostic test results will improve Outcome: Progressing Goal: Respiratory complications will improve Outcome: Progressing Goal: Cardiovascular complication will be avoided Outcome: Progressing   Problem: Activity: Goal: Risk for activity intolerance will decrease Outcome: Progressing   Problem: Nutrition: Goal: Adequate nutrition will be maintained Outcome: Progressing   Problem: Coping: Goal: Level of anxiety will decrease Outcome: Progressing   Problem: Elimination: Goal: Will not experience complications related to bowel motility Outcome: Progressing Goal: Will not experience complications related to urinary retention Outcome: Progressing   Problem: Pain Managment: Goal: General experience of comfort will improve and/or be controlled Outcome: Progressing   Problem: Safety: Goal: Ability to remain free from injury will improve Outcome: Progressing   Problem: Skin Integrity: Goal: Risk for impaired skin integrity will decrease Outcome: Progressing

## 2023-06-02 DIAGNOSIS — J441 Chronic obstructive pulmonary disease with (acute) exacerbation: Secondary | ICD-10-CM | POA: Diagnosis not present

## 2023-06-02 LAB — GLUCOSE, CAPILLARY
Glucose-Capillary: 112 mg/dL — ABNORMAL HIGH (ref 70–99)
Glucose-Capillary: 113 mg/dL — ABNORMAL HIGH (ref 70–99)

## 2023-06-02 MED ORDER — SENNA 8.6 MG PO TABS: 1.0000 | ORAL_TABLET | Freq: Every day | ORAL | 0 refills | Status: AC | PRN

## 2023-06-02 MED ORDER — FERROUS SULFATE 325 (65 FE) MG PO TABS: 325.0000 mg | ORAL_TABLET | Freq: Two times a day (BID) | ORAL | 0 refills | Status: DC

## 2023-06-02 MED ORDER — POLYETHYLENE GLYCOL 3350 17 G PO PACK: 17.0000 g | PACK | Freq: Every day | ORAL | 0 refills | Status: AC

## 2023-06-02 MED ORDER — GLIPIZIDE 2.5 MG PO TABS: 2.5000 mg | ORAL_TABLET | Freq: Every day | ORAL | 0 refills | Status: DC

## 2023-06-02 MED ORDER — GLUCERNA SHAKE PO LIQD: 237.0000 mL | Freq: Two times a day (BID) | ORAL | 1 refills | Status: AC

## 2023-06-02 NOTE — Progress Notes (Signed)
 Mobility Specialist Progress Note:    06/02/23 1000  Mobility  Activity Ambulated with assistance in hallway  Level of Assistance Minimal assist, patient does 75% or more  Assistive Device Four wheel walker  Distance Ambulated (ft) 60 ft  Activity Response Tolerated well  Mobility Referral Yes  Mobility visit 1 Mobility  Mobility Specialist Start Time (ACUTE ONLY) 0859  Mobility Specialist Stop Time (ACUTE ONLY) [id number]  Mobility Specialist Time Calculation (min) (ACUTE ONLY) 24 min   Pt received in bed and agreeable. C/o increased LLQ pain this date. Took x1 seated rest break d/t dizziness. Returned to room w/o fault. Pt left in chair with call bell and all needs met.  D'Vante Nolon Baxter Mobility Specialist Please contact via Special educational needs teacher or Rehab office at [phone]

## 2023-06-02 NOTE — Plan of Care (Signed)
  Problem: Education: Goal: Ability to describe self-care measures that may prevent or decrease complications (Diabetes Survival Skills Education) will improve Outcome: Progressing Goal: Individualized Educational Video(s) Outcome: Progressing   Problem: Coping: Goal: Ability to adjust to condition or change in health will improve Outcome: Progressing   Problem: Fluid Volume: Goal: Ability to maintain a balanced intake and output will improve Outcome: Progressing   Problem: Health Behavior/Discharge Planning: Goal: Ability to identify and utilize available resources and services will improve Outcome: Progressing Goal: Ability to manage health-related needs will improve Outcome: Progressing   Problem: Metabolic: Goal: Ability to maintain appropriate glucose levels will improve Outcome: Progressing   Problem: Nutritional: Goal: Maintenance of adequate nutrition will improve Outcome: Progressing Goal: Progress toward achieving an optimal weight will improve Outcome: Progressing   Problem: Skin Integrity: Goal: Risk for impaired skin integrity will decrease Outcome: Progressing   Problem: Tissue Perfusion: Goal: Adequacy of tissue perfusion will improve Outcome: Progressing   Problem: Fluid Volume: Goal: Hemodynamic stability will improve Outcome: Progressing   Problem: Clinical Measurements: Goal: Diagnostic test results will improve Outcome: Progressing Goal: Signs and symptoms of infection will decrease Outcome: Progressing   Problem: Respiratory: Goal: Ability to maintain adequate ventilation will improve Outcome: Progressing   Problem: Activity: Goal: Ability to tolerate increased activity will improve Outcome: Progressing   Problem: Clinical Measurements: Goal: Ability to maintain a body temperature in the normal range will improve Outcome: Progressing   Problem: Respiratory: Goal: Ability to maintain adequate ventilation will improve Outcome:  Progressing Goal: Ability to maintain a clear airway will improve Outcome: Progressing   Problem: Education: Goal: Knowledge of General Education information will improve Description: Including pain rating scale, medication(s)/side effects and non-pharmacologic comfort measures Outcome: Progressing   Problem: Health Behavior/Discharge Planning: Goal: Ability to manage health-related needs will improve Outcome: Progressing   Problem: Clinical Measurements: Goal: Ability to maintain clinical measurements within normal limits will improve Outcome: Progressing Goal: Will remain free from infection Outcome: Progressing Goal: Diagnostic test results will improve Outcome: Progressing Goal: Respiratory complications will improve Outcome: Progressing Goal: Cardiovascular complication will be avoided Outcome: Progressing   Problem: Activity: Goal: Risk for activity intolerance will decrease Outcome: Progressing   Problem: Nutrition: Goal: Adequate nutrition will be maintained Outcome: Progressing   Problem: Coping: Goal: Level of anxiety will decrease Outcome: Progressing   Problem: Elimination: Goal: Will not experience complications related to bowel motility Outcome: Progressing Goal: Will not experience complications related to urinary retention Outcome: Progressing   Problem: Pain Managment: Goal: General experience of comfort will improve and/or be controlled Outcome: Progressing   Problem: Safety: Goal: Ability to remain free from injury will improve Outcome: Progressing   Problem: Skin Integrity: Goal: Risk for impaired skin integrity will decrease Outcome: Progressing

## 2023-06-03 ENCOUNTER — Encounter (HOSPITAL_COMMUNITY): Payer: Self-pay | Admitting: Emergency Medicine

## 2023-06-03 ENCOUNTER — Emergency Department (HOSPITAL_COMMUNITY)

## 2023-06-03 ENCOUNTER — Inpatient Hospital Stay (HOSPITAL_COMMUNITY)
Admission: EM | Admit: 2023-06-03 | Discharge: 2023-06-12 | DRG: 813 | Disposition: A | Discharge status: Home Health | Attending: Internal Medicine | Admitting: Internal Medicine

## 2023-06-03 ENCOUNTER — Other Ambulatory Visit: Payer: Self-pay

## 2023-06-03 DIAGNOSIS — D472 Monoclonal gammopathy: Secondary | ICD-10-CM | POA: Diagnosis present

## 2023-06-03 DIAGNOSIS — Z833 Family history of diabetes mellitus: Secondary | ICD-10-CM

## 2023-06-03 DIAGNOSIS — I2510 Atherosclerotic heart disease of native coronary artery without angina pectoris: Secondary | ICD-10-CM | POA: Diagnosis present

## 2023-06-03 DIAGNOSIS — E785 Hyperlipidemia, unspecified: Secondary | ICD-10-CM | POA: Diagnosis present

## 2023-06-03 DIAGNOSIS — Z7951 Long term (current) use of inhaled steroids: Secondary | ICD-10-CM

## 2023-06-03 DIAGNOSIS — M1990 Unspecified osteoarthritis, unspecified site: Secondary | ICD-10-CM | POA: Diagnosis present

## 2023-06-03 DIAGNOSIS — R0789 Other chest pain: Secondary | ICD-10-CM | POA: Diagnosis not present

## 2023-06-03 DIAGNOSIS — H9193 Unspecified hearing loss, bilateral: Secondary | ICD-10-CM | POA: Diagnosis present

## 2023-06-03 DIAGNOSIS — K5900 Constipation, unspecified: Secondary | ICD-10-CM | POA: Diagnosis present

## 2023-06-03 DIAGNOSIS — Z794 Long term (current) use of insulin: Secondary | ICD-10-CM

## 2023-06-03 DIAGNOSIS — Z79899 Other long term (current) drug therapy: Secondary | ICD-10-CM

## 2023-06-03 DIAGNOSIS — I252 Old myocardial infarction: Secondary | ICD-10-CM

## 2023-06-03 DIAGNOSIS — J449 Chronic obstructive pulmonary disease, unspecified: Secondary | ICD-10-CM | POA: Diagnosis present

## 2023-06-03 DIAGNOSIS — R944 Abnormal results of kidney function studies: Secondary | ICD-10-CM | POA: Diagnosis present

## 2023-06-03 DIAGNOSIS — D6832 Hemorrhagic disorder due to extrinsic circulating anticoagulants: Secondary | ICD-10-CM | POA: Diagnosis not present

## 2023-06-03 DIAGNOSIS — T148XXA Other injury of unspecified body region, initial encounter: Secondary | ICD-10-CM | POA: Diagnosis present

## 2023-06-03 DIAGNOSIS — Z79811 Long term (current) use of aromatase inhibitors: Secondary | ICD-10-CM

## 2023-06-03 DIAGNOSIS — G8929 Other chronic pain: Secondary | ICD-10-CM | POA: Diagnosis present

## 2023-06-03 DIAGNOSIS — E119 Type 2 diabetes mellitus without complications: Secondary | ICD-10-CM

## 2023-06-03 DIAGNOSIS — E875 Hyperkalemia: Secondary | ICD-10-CM | POA: Diagnosis present

## 2023-06-03 DIAGNOSIS — J9811 Atelectasis: Secondary | ICD-10-CM | POA: Diagnosis present

## 2023-06-03 DIAGNOSIS — X58XXXA Exposure to other specified factors, initial encounter: Secondary | ICD-10-CM | POA: Diagnosis present

## 2023-06-03 DIAGNOSIS — Z7984 Long term (current) use of oral hypoglycemic drugs: Secondary | ICD-10-CM

## 2023-06-03 DIAGNOSIS — D62 Acute posthemorrhagic anemia: Secondary | ICD-10-CM | POA: Diagnosis present

## 2023-06-03 DIAGNOSIS — I10 Essential (primary) hypertension: Secondary | ICD-10-CM | POA: Diagnosis present

## 2023-06-03 DIAGNOSIS — J441 Chronic obstructive pulmonary disease with (acute) exacerbation: Secondary | ICD-10-CM | POA: Diagnosis present

## 2023-06-03 DIAGNOSIS — R531 Weakness: Secondary | ICD-10-CM

## 2023-06-03 DIAGNOSIS — Z7982 Long term (current) use of aspirin: Secondary | ICD-10-CM

## 2023-06-03 DIAGNOSIS — N184 Chronic kidney disease, stage 4 (severe): Secondary | ICD-10-CM | POA: Diagnosis present

## 2023-06-03 DIAGNOSIS — Z8739 Personal history of other diseases of the musculoskeletal system and connective tissue: Secondary | ICD-10-CM

## 2023-06-03 DIAGNOSIS — I959 Hypotension, unspecified: Secondary | ICD-10-CM | POA: Diagnosis present

## 2023-06-03 DIAGNOSIS — E271 Primary adrenocortical insufficiency: Secondary | ICD-10-CM | POA: Diagnosis present

## 2023-06-03 DIAGNOSIS — J189 Pneumonia, unspecified organism: Secondary | ICD-10-CM | POA: Diagnosis present

## 2023-06-03 DIAGNOSIS — Z1152 Encounter for screening for COVID-19: Secondary | ICD-10-CM

## 2023-06-03 DIAGNOSIS — Z853 Personal history of malignant neoplasm of breast: Secondary | ICD-10-CM

## 2023-06-03 DIAGNOSIS — K4020 Bilateral inguinal hernia, without obstruction or gangrene, not specified as recurrent: Secondary | ICD-10-CM | POA: Diagnosis present

## 2023-06-03 DIAGNOSIS — K219 Gastro-esophageal reflux disease without esophagitis: Secondary | ICD-10-CM | POA: Diagnosis present

## 2023-06-03 DIAGNOSIS — E872 Acidosis, unspecified: Secondary | ICD-10-CM | POA: Diagnosis present

## 2023-06-03 DIAGNOSIS — D638 Anemia in other chronic diseases classified elsewhere: Secondary | ICD-10-CM | POA: Diagnosis present

## 2023-06-03 DIAGNOSIS — E1122 Type 2 diabetes mellitus with diabetic chronic kidney disease: Secondary | ICD-10-CM | POA: Diagnosis present

## 2023-06-03 DIAGNOSIS — Z8249 Family history of ischemic heart disease and other diseases of the circulatory system: Secondary | ICD-10-CM

## 2023-06-03 DIAGNOSIS — E1165 Type 2 diabetes mellitus with hyperglycemia: Secondary | ICD-10-CM | POA: Diagnosis present

## 2023-06-03 DIAGNOSIS — K760 Fatty (change of) liver, not elsewhere classified: Secondary | ICD-10-CM | POA: Diagnosis present

## 2023-06-03 DIAGNOSIS — Z87891 Personal history of nicotine dependence: Secondary | ICD-10-CM

## 2023-06-03 DIAGNOSIS — Z955 Presence of coronary angioplasty implant and graft: Secondary | ICD-10-CM

## 2023-06-03 DIAGNOSIS — I700 Atherosclerosis of aorta: Secondary | ICD-10-CM | POA: Diagnosis present

## 2023-06-03 DIAGNOSIS — M109 Gout, unspecified: Secondary | ICD-10-CM | POA: Insufficient documentation

## 2023-06-03 DIAGNOSIS — S301XXA Contusion of abdominal wall, initial encounter: Principal | ICD-10-CM

## 2023-06-03 DIAGNOSIS — Z8701 Personal history of pneumonia (recurrent): Secondary | ICD-10-CM

## 2023-06-03 DIAGNOSIS — F411 Generalized anxiety disorder: Secondary | ICD-10-CM | POA: Insufficient documentation

## 2023-06-03 DIAGNOSIS — M25512 Pain in left shoulder: Secondary | ICD-10-CM | POA: Diagnosis present

## 2023-06-03 DIAGNOSIS — Z96612 Presence of left artificial shoulder joint: Secondary | ICD-10-CM | POA: Diagnosis present

## 2023-06-03 DIAGNOSIS — I129 Hypertensive chronic kidney disease with stage 1 through stage 4 chronic kidney disease, or unspecified chronic kidney disease: Secondary | ICD-10-CM | POA: Diagnosis present

## 2023-06-03 LAB — COMPREHENSIVE METABOLIC PANEL WITH GFR
ALT: 27 U/L (ref 0–44)
AST: 26 U/L (ref 15–41)
Albumin: 3.2 g/dL — ABNORMAL LOW (ref 3.5–5.0)
Alkaline Phosphatase: 84 U/L (ref 38–126)
Anion gap: 13 (ref 5–15)
BUN: 49 mg/dL — ABNORMAL HIGH (ref 8–23)
CO2: 24 mmol/L (ref 22–32)
Calcium: 9.3 mg/dL (ref 8.9–10.3)
Chloride: 98 mmol/L (ref 98–111)
Creatinine, Ser: 2.42 mg/dL — ABNORMAL HIGH (ref 0.44–1.00)
GFR, Estimated: 20 mL/min — ABNORMAL LOW (ref >60)
Glucose, Bld: 178 mg/dL — ABNORMAL HIGH (ref 70–99)
Potassium: 5.9 mmol/L — ABNORMAL HIGH (ref 3.5–5.1)
Sodium: 135 mmol/L (ref 135–145)
Total Bilirubin: 0.9 mg/dL (ref 0.0–1.2)
Total Protein: 6.9 g/dL (ref 6.5–8.1)

## 2023-06-03 LAB — CBC
HCT: 29.6 % — ABNORMAL LOW (ref 36.0–46.0)
Hemoglobin: 9.2 g/dL — ABNORMAL LOW (ref 12.0–15.0)
MCH: 29.2 pg (ref 26.0–34.0)
MCHC: 31.1 g/dL (ref 30.0–36.0)
MCV: 94 fL (ref 80.0–100.0)
Platelets: 232 10*3/uL (ref 150–400)
RBC: 3.15 MIL/uL — ABNORMAL LOW (ref 3.87–5.11)
RDW: 16 % — ABNORMAL HIGH (ref 11.5–15.5)
WBC: 14.9 10*3/uL — ABNORMAL HIGH (ref 4.0–10.5)
nRBC: 0.1 % (ref 0.0–0.2)

## 2023-06-03 LAB — TROPONIN I (HIGH SENSITIVITY): Troponin I (High Sensitivity): 13 ng/L (ref <18)

## 2023-06-03 MED ORDER — LACTATED RINGERS IV BOLUS
500.0000 mL | Freq: Once | INTRAVENOUS | Status: AC
  Administered 2023-06-03: 500 mL via INTRAVENOUS

## 2023-06-03 MED ORDER — LACTATED RINGERS IV BOLUS
500.0000 mL | Freq: Once | INTRAVENOUS | Status: AC
Start: 2023-06-03 — End: 2023-06-03
  Administered 2023-06-03: 500 mL via INTRAVENOUS

## 2023-06-03 MED ORDER — FENTANYL CITRATE PF 50 MCG/ML IJ SOSY
50.0000 ug | PREFILLED_SYRINGE | Freq: Once | INTRAMUSCULAR | Status: AC
  Administered 2023-06-03: 50 ug via INTRAVENOUS
  Filled 2023-06-03: qty 1

## 2023-06-03 MED ORDER — SODIUM CHLORIDE 0.9 % IV BOLUS
500.0000 mL | Freq: Once | INTRAVENOUS | Status: AC
  Administered 2023-06-03: 500 mL via INTRAVENOUS

## 2023-06-03 MED ORDER — SODIUM ZIRCONIUM CYCLOSILICATE 10 G PO PACK
10.0000 g | PACK | Freq: Once | ORAL | Status: AC
  Administered 2023-06-03: 10 g via ORAL
  Filled 2023-06-03: qty 1

## 2023-06-03 NOTE — ED Notes (Signed)
 Patient's sister given update over phone with patient's permission.

## 2023-06-03 NOTE — ED Provider Notes (Signed)
 [place] EMERGENCY DEPARTMENT AT [facility] Provider Note   CSN: [id number] Arrival date & time: 06/03/23  1926     History  Chief Complaint  Patient presents with   Chest Pain   Arm Pain    [name] is a 73 y.o. female.  Patient is a 73 year old who presents with pain in her left chest and abdomen.  She was actually just discharged from hospital yesterday after she was admitted for a COPD exacerbation.  She said she has pain in her left chest.  Actually seems to be more in her shoulder.  It radiates around her neck and left shoulder.  She says she has been having this for a while.  She takes gabapentin  at home.  She denies any recent falls or injuries to the shoulder.  She does not have any associated shortness of breath.  No exertional symptoms.  It is worse with movement of the left arm.  She also has pain in her abdomen.  No vomiting.  No change in her stools.  No urinary symptoms.  No known fevers.       Home Medications Prior to Admission medications   Medication Sig Start Date End Date Taking? Authorizing Provider  acetaminophen  (TYLENOL ) 650 MG CR tablet Take 650 mg by mouth every 8 (eight) hours as needed for pain.    [provider]  albuterol  (PROVENTIL ) (2.5 MG/3ML) 0.083% nebulizer solution Take 3 mLs (2.5 mg total) by nebulization every 6 (six) hours as needed for wheezing or shortness of breath. 08/02/21   Angelia Kelp, PA-C  albuterol  (VENTOLIN  HFA) 108 (90 Base) MCG/ACT inhaler Inhale 1-2 puffs into the lungs every 6 (six) hours as needed for wheezing or shortness of breath. 09/14/21   Hassie Lint, PA-C  allopurinol  (ZYLOPRIM ) 100 MG tablet Take 100 mg by mouth daily.    [provider]  anastrozole  (ARIMIDEX ) 1 MG tablet TAKE ONE TABLET BY MOUTH ONCE DAILY 06/18/22   Cameron Cea, MD  aspirin  81 MG EC tablet Take 1 tablet (81 mg total) by mouth daily. Patient taking differently: Take 81 mg by mouth in the morning and  at bedtime. 10/13/18   Pokhrel, Amador Bad, MD  atorvastatin  (LIPITOR) 20 MG tablet Take 20 mg by mouth daily.    [provider]  Blood Pressure Monitor DEVI Please provide patient with insurance approved blood pressure monitor. I10.0 11/15/20   Fleming, Zelda W, NP  diclofenac  Sodium (VOLTAREN ) 1 % GEL Apply 1 Application topically 4 (four) times daily. Patient taking differently: Apply 1 Application topically in the morning and at bedtime. 09/13/21   Gudena, Vinay, MD  DULoxetine  (CYMBALTA ) 30 MG capsule Take 30 mg by mouth daily.    [provider]  feeding supplement, GLUCERNA SHAKE, (GLUCERNA SHAKE) LIQD Take 237 mLs by mouth 2 (two) times daily between meals. 06/02/23   Doroteo Gasmen, MD  ferrous sulfate  325 (65 FE) MG tablet Take 1 tablet (325 mg total) by mouth 2 (two) times daily with a meal. 06/02/23   Doroteo Gasmen, MD  fluticasone  (FLONASE ) 50 MCG/ACT nasal spray Place 2 sprays into both nostrils daily. 05/10/21   Newlin, Enobong, MD  gabapentin  (NEURONTIN ) 100 MG capsule Take 100-200 mg by mouth See admin instructions. Take one tablet by mouth in the morning and afternoon, then take 2 tablets every night per patient 03/12/22   [provider]  glipiZIDE  2.5 MG TABS Take 2.5 mg by mouth daily before breakfast. 06/02/23  Doroteo Gasmen, MD  hydrOXYzine  (ATARAX ) 25 MG tablet Take 50 mg by mouth at bedtime. 05/07/23   [provider]  Insulin  Pen Needle (EASY TOUCH PEN NEEDLES) 31G X 8 MM MISC Use to inject insulin  once daily and Victoza  once daily. Max of 2 pen needles a day. Must have office visit for refills 03/28/22   Newlin, Enobong, MD  methocarbamol  (ROBAXIN ) 500 MG tablet Take 1 tablet (500 mg total) by mouth 2 (two) times daily. 03/09/23   Prosperi, Christian H, PA-C  Misc. Devices MISC Please provide patient with nebulizer mask and tubing. [id number] 12/14/19   Fleming, Zelda W, NP  nitroGLYCERIN  (NITROSTAT ) 0.4 MG SL tablet Place 1 tablet (0.4 mg  total) under the tongue every 5 (five) minutes as needed for chest pain. 03/15/20   Fleming, Zelda W, NP  pantoprazole  (PROTONIX ) 40 MG tablet TAKE ONE TABLET BY MOUTH EVERY DAY FOR ACID REFLUX 11/02/21   Fleming, Zelda W, NP  polyethylene glycol (MIRALAX  / GLYCOLAX ) 17 g packet Take 17 g by mouth daily. 06/03/23   Doroteo Gasmen, MD  Respiratory Therapy Supplies (NEBULIZER MASK ADULT) MISC 1 Units by Does not apply route daily as needed. ICD 10 J44.9 05/08/21   Fleming, Zelda W, NP  senna (SENOKOT) 8.6 MG TABS tablet Take 1 tablet (8.6 mg total) by mouth daily as needed for mild constipation or moderate constipation. 06/02/23   Doroteo Gasmen, MD  SYMBICORT  160-4.5 MCG/ACT inhaler Inhale 2 puffs into the lungs in the morning and at bedtime. 02/28/22   [provider]      Allergies    Patient has no known allergies.    Review of Systems   Review of Systems  Constitutional:  Positive for fatigue. Negative for chills, diaphoresis and fever.  HENT:  Negative for congestion, rhinorrhea and sneezing.   Eyes: Negative.   Respiratory:  Negative for cough, chest tightness and shortness of breath.   Cardiovascular:  Positive for chest pain (Left shoulder pain). Negative for leg swelling.  Gastrointestinal:  Positive for abdominal pain. Negative for blood in stool, diarrhea, nausea and vomiting.  Genitourinary:  Negative for difficulty urinating, flank pain, frequency and hematuria.  Musculoskeletal:  Positive for arthralgias. Negative for back pain.  Skin:  Negative for rash.  Neurological:  Negative for dizziness, speech difficulty, weakness, numbness and headaches.    Physical Exam Updated Vital Signs BP 93/66   Pulse 74   Temp (!) 97.4 F (36.3 C) (Oral)   Resp 18   Wt 64 kg   LMP  (LMP Unknown) Comment: tubal ligation  SpO2 100%   BMI 28.50 kg/m  Physical Exam Constitutional:      Appearance: She is well-developed.  HENT:     Head: Normocephalic and atraumatic.  Eyes:      Pupils: Pupils are equal, round, and reactive to light.  Cardiovascular:     Rate and Rhythm: Normal rate and regular rhythm.     Heart sounds: Normal heart sounds.  Pulmonary:     Effort: Pulmonary effort is normal. No respiratory distress.     Breath sounds: Normal breath sounds. No wheezing or rales.  Chest:     Chest wall: No tenderness.  Abdominal:     General: Bowel sounds are normal.     Palpations: Abdomen is soft.     Tenderness: There is abdominal tenderness. There is no guarding or rebound.     Comments: Patient has areas of ecchymosis to her abdomen, presumably  from Lovenox  injections.  She has generalized tenderness to the abdomen.  Musculoskeletal:        General: Normal range of motion.     Cervical back: Normal range of motion and neck supple.     Comments: Positive tenderness on palpation and range of motion of the left shoulder.  There is no deformity or swelling.  There is tenderness along the left trapezius muscle.  Radial pulses are intact.  She has normal sensation and motor function distally.  Lymphadenopathy:     Cervical: No cervical adenopathy.  Skin:    General: Skin is warm and dry.     Findings: No rash.  Neurological:     Mental Status: She is alert and oriented to person, place, and time.     ED Results / Procedures / Treatments   Labs (all labs ordered are listed, but only abnormal results are displayed) Labs Reviewed  CBC - Abnormal; Notable for the following components:      Result Value   WBC 14.9 (*)    RBC 3.15 (*)    Hemoglobin 9.2 (*)    HCT 29.6 (*)    RDW 16.0 (*)    All other components within normal limits  COMPREHENSIVE METABOLIC PANEL WITH GFR - Abnormal; Notable for the following components:   Potassium 5.9 (*)    Glucose, Bld 178 (*)    BUN 49 (*)    Creatinine, Ser 2.42 (*)    Albumin  3.2 (*)    GFR, Estimated 20 (*)    All other components within normal limits  TROPONIN I (HIGH SENSITIVITY)     EKG None  Radiology CT ABDOMEN PELVIS WO CONTRAST Result Date: 06/03/2023 CLINICAL DATA:  Chest pain, arm pain, abdominal pain EXAM: CT ABDOMEN AND PELVIS WITHOUT CONTRAST TECHNIQUE: Multidetector CT imaging of the abdomen and pelvis was performed following the standard protocol without IV contrast. Unenhanced CT was performed per clinician order. Lack of IV contrast limits sensitivity and specificity, especially for evaluation of abdominal/pelvic solid viscera. RADIATION DOSE REDUCTION: This exam was performed according to the departmental dose-optimization program which includes automated exposure control, adjustment of the mA and/or kV according to patient size and/or use of iterative reconstruction technique. COMPARISON:  06/03/2023, 06/01/2023, 11/02/2022 FINDINGS: Lower chest: Mild dependent lower lobe atelectasis. No acute airspace disease. Cardiomegaly without pericardial effusion. Hepatobiliary: Cholecystectomy. Unremarkable unenhanced appearance of the liver. Pancreas: Unremarkable unenhanced appearance. Spleen: Unremarkable unenhanced appearance. Adrenals/Urinary Tract: No urinary tract calculi or obstructive uropathy within either kidney. The adrenals and bladder are unremarkable. Stomach/Bowel: No bowel obstruction or ileus. Postsurgical changes from prior bowel resection and ileocolic anastomosis right lower quadrant. No bowel wall thickening or inflammatory change. Mild retained stool within the proximal colon. Vascular/Lymphatic: Aortic atherosclerosis. No enlarged abdominal or pelvic lymph nodes. Reproductive: Uterus and bilateral adnexa are unremarkable. Other: No free fluid or free intraperitoneal gas. Small bilateral fat containing inguinal hernias. Musculoskeletal: Intramuscular hematoma within the left anterolateral abdomen, measuring up to 3.7 cm within the rectus sheath. The intramuscular hematoma extends along the transversus abdominal muscles as well, and extends approximately 20  cm in craniocaudal extent. Mild subcutaneous fat stranding within the left lateral abdomen. No acute or destructive bony abnormalities. Reconstructed images demonstrate no additional findings. IMPRESSION: 1. Extensive intramuscular hematoma within the left anterolateral abdominal wall, measuring up to 3.7 cm within the rectus sheath. 2. Small bilateral fat containing inguinal hernias. 3.  Aortic Atherosclerosis (ICD10-I70.0). Critical Value/emergent results were called by telephone at the time  of interpretation on 06/03/2023 at 10:05 pm to provider Traeh Milroy , who verbally acknowledged these results. Electronically Signed   By: Bobbye Burrow M.D.   On: 06/03/2023 22:06   DG Chest Port 1 View Result Date: 06/03/2023 CLINICAL DATA:  Chest pain. Left arm pain. Decreased oxygen  saturation. EXAM: PORTABLE CHEST 1 VIEW COMPARISON:  05/31/2023 FINDINGS: Shallow inspiration. Heart size and pulmonary vascularity are normal for technique. Consolidation or atelectasis in the left base. No pleural effusion or pneumothorax is identified. Calcification of the aorta. Mediastinal contours appear intact. Degenerative changes in the right shoulder and spine. Postoperative changes in the left shoulder. IMPRESSION: Shallow inspiration with consolidation or atelectasis in the left base. Electronically Signed   By: Boyce Byes M.D.   On: 06/03/2023 20:24    Procedures Procedures    Medications Ordered in ED Medications  lactated ringers  bolus 500 mL (0 mLs Intravenous Stopped 06/03/23 2236)  fentaNYL  (SUBLIMAZE ) injection 50 mcg (50 mcg Intravenous Given 06/03/23 2122)  sodium chloride  0.9 % bolus 500 mL (0 mLs Intravenous Stopped 06/03/23 2338)  sodium zirconium cyclosilicate  (LOKELMA ) packet 10 g (10 g Oral Given 06/03/23 2247)  lactated ringers  bolus 500 mL (500 mLs Intravenous New Bag/Given 06/03/23 2338)    ED Course/ Medical Decision Making/ A&P                                 Medical Decision  Making Amount and/or Complexity of Data Reviewed Labs: ordered. Radiology: ordered.  Risk Prescription drug management.   Patient is a 73 year old who presents with left side chest/shoulder pain and abdominal pain.  Her chest pain seems to be musculoskeletal in nature.  There is no ischemic changes on her EKG.  Troponin is negative.  Chest x-ray was interpreted by me and confirmed by the radiologist to show no acute abnormalities.  There is some questionable atelectasis versus infiltrate although on the CT scan, there was no evidence of pneumonia.  She had a CT scan of her abdomen and pelvis which showed a large rectal sheath hematoma.  Her hemoglobin is similar to prior values based on chart review.  However given her discomfort in her soft blood pressures, feel that she would benefit from admission and observation of the hematoma.  Her BP has been soft and on chart review, looks like it was borderline low on discharge.  It has responded to IV fluids.  She does not have any other suggestions of infection.  Will consult the hospitalist for admission.  Final Clinical Impression(s) / ED Diagnoses Final diagnoses:  Abdominal wall hematoma, initial encounter  Chronic left shoulder pain    Rx / DC Orders ED Discharge Orders     None         Hershel Los, MD 06/04/23 0000

## 2023-06-03 NOTE — ED Notes (Signed)
 Patient's NRB switched to [place] at 3LPM.

## 2023-06-03 NOTE — ED Notes (Signed)
 Troponin re-timed d/t IV team needing to start IV and get blood d/t difficult stick.

## 2023-06-03 NOTE — ED Triage Notes (Signed)
 Patient BIB [facility] c/o left arm pain and chest pain since yesterday.  Patient just d/c'd from hospital admission  yesterday.  Patient placed on NRB at 15L because [facility] stated her sats kept dropping and her bp was "soft."

## 2023-06-04 ENCOUNTER — Encounter (HOSPITAL_COMMUNITY): Payer: Self-pay | Admitting: Internal Medicine

## 2023-06-04 DIAGNOSIS — J449 Chronic obstructive pulmonary disease, unspecified: Secondary | ICD-10-CM | POA: Diagnosis present

## 2023-06-04 DIAGNOSIS — E875 Hyperkalemia: Secondary | ICD-10-CM | POA: Diagnosis present

## 2023-06-04 DIAGNOSIS — S301XXA Contusion of abdominal wall, initial encounter: Secondary | ICD-10-CM

## 2023-06-04 DIAGNOSIS — Z794 Long term (current) use of insulin: Secondary | ICD-10-CM

## 2023-06-04 DIAGNOSIS — E872 Acidosis, unspecified: Secondary | ICD-10-CM | POA: Diagnosis present

## 2023-06-04 DIAGNOSIS — D638 Anemia in other chronic diseases classified elsewhere: Secondary | ICD-10-CM | POA: Diagnosis present

## 2023-06-04 DIAGNOSIS — F411 Generalized anxiety disorder: Secondary | ICD-10-CM | POA: Diagnosis present

## 2023-06-04 DIAGNOSIS — M109 Gout, unspecified: Secondary | ICD-10-CM | POA: Diagnosis present

## 2023-06-04 DIAGNOSIS — I129 Hypertensive chronic kidney disease with stage 1 through stage 4 chronic kidney disease, or unspecified chronic kidney disease: Secondary | ICD-10-CM | POA: Diagnosis present

## 2023-06-04 DIAGNOSIS — J410 Simple chronic bronchitis: Secondary | ICD-10-CM | POA: Diagnosis not present

## 2023-06-04 DIAGNOSIS — S301XXD Contusion of abdominal wall, subsequent encounter: Secondary | ICD-10-CM

## 2023-06-04 DIAGNOSIS — I959 Hypotension, unspecified: Secondary | ICD-10-CM | POA: Diagnosis not present

## 2023-06-04 DIAGNOSIS — Z853 Personal history of malignant neoplasm of breast: Secondary | ICD-10-CM

## 2023-06-04 DIAGNOSIS — K760 Fatty (change of) liver, not elsewhere classified: Secondary | ICD-10-CM | POA: Diagnosis present

## 2023-06-04 DIAGNOSIS — E1122 Type 2 diabetes mellitus with diabetic chronic kidney disease: Secondary | ICD-10-CM | POA: Diagnosis present

## 2023-06-04 DIAGNOSIS — S301XXS Contusion of abdominal wall, sequela: Secondary | ICD-10-CM | POA: Diagnosis not present

## 2023-06-04 DIAGNOSIS — M1000 Idiopathic gout, unspecified site: Secondary | ICD-10-CM

## 2023-06-04 DIAGNOSIS — J189 Pneumonia, unspecified organism: Secondary | ICD-10-CM

## 2023-06-04 DIAGNOSIS — I700 Atherosclerosis of aorta: Secondary | ICD-10-CM | POA: Diagnosis present

## 2023-06-04 DIAGNOSIS — E119 Type 2 diabetes mellitus without complications: Secondary | ICD-10-CM

## 2023-06-04 DIAGNOSIS — R531 Weakness: Secondary | ICD-10-CM

## 2023-06-04 DIAGNOSIS — E785 Hyperlipidemia, unspecified: Secondary | ICD-10-CM | POA: Diagnosis present

## 2023-06-04 DIAGNOSIS — I10 Essential (primary) hypertension: Secondary | ICD-10-CM | POA: Diagnosis not present

## 2023-06-04 DIAGNOSIS — T148XXA Other injury of unspecified body region, initial encounter: Secondary | ICD-10-CM | POA: Diagnosis present

## 2023-06-04 DIAGNOSIS — J9811 Atelectasis: Secondary | ICD-10-CM | POA: Diagnosis present

## 2023-06-04 DIAGNOSIS — N184 Chronic kidney disease, stage 4 (severe): Secondary | ICD-10-CM

## 2023-06-04 DIAGNOSIS — R0789 Other chest pain: Secondary | ICD-10-CM | POA: Diagnosis present

## 2023-06-04 DIAGNOSIS — G8929 Other chronic pain: Secondary | ICD-10-CM | POA: Diagnosis present

## 2023-06-04 DIAGNOSIS — E1165 Type 2 diabetes mellitus with hyperglycemia: Secondary | ICD-10-CM | POA: Diagnosis present

## 2023-06-04 DIAGNOSIS — R578 Other shock: Secondary | ICD-10-CM | POA: Diagnosis not present

## 2023-06-04 DIAGNOSIS — E271 Primary adrenocortical insufficiency: Secondary | ICD-10-CM | POA: Diagnosis present

## 2023-06-04 DIAGNOSIS — X58XXXA Exposure to other specified factors, initial encounter: Secondary | ICD-10-CM | POA: Diagnosis present

## 2023-06-04 DIAGNOSIS — E8729 Other acidosis: Secondary | ICD-10-CM | POA: Diagnosis not present

## 2023-06-04 DIAGNOSIS — D6832 Hemorrhagic disorder due to extrinsic circulating anticoagulants: Secondary | ICD-10-CM | POA: Diagnosis present

## 2023-06-04 DIAGNOSIS — Z79811 Long term (current) use of aromatase inhibitors: Secondary | ICD-10-CM | POA: Diagnosis not present

## 2023-06-04 DIAGNOSIS — Z8739 Personal history of other diseases of the musculoskeletal system and connective tissue: Secondary | ICD-10-CM

## 2023-06-04 DIAGNOSIS — J441 Chronic obstructive pulmonary disease with (acute) exacerbation: Secondary | ICD-10-CM | POA: Diagnosis present

## 2023-06-04 DIAGNOSIS — D472 Monoclonal gammopathy: Secondary | ICD-10-CM | POA: Diagnosis present

## 2023-06-04 DIAGNOSIS — Z1152 Encounter for screening for COVID-19: Secondary | ICD-10-CM | POA: Diagnosis not present

## 2023-06-04 DIAGNOSIS — D62 Acute posthemorrhagic anemia: Secondary | ICD-10-CM | POA: Diagnosis present

## 2023-06-04 LAB — COMPREHENSIVE METABOLIC PANEL WITH GFR
ALT: 24 U/L (ref 0–44)
ALT: 24 U/L (ref 0–44)
AST: 20 U/L (ref 15–41)
AST: 21 U/L (ref 15–41)
Albumin: 2.9 g/dL — ABNORMAL LOW (ref 3.5–5.0)
Albumin: 2.7 g/dL — ABNORMAL LOW (ref 3.5–5.0)
Alkaline Phosphatase: 78 U/L (ref 38–126)
Alkaline Phosphatase: 68 U/L (ref 38–126)
Anion gap: 11 (ref 5–15)
Anion gap: 10 (ref 5–15)
BUN: 47 mg/dL — ABNORMAL HIGH (ref 8–23)
BUN: 42 mg/dL — ABNORMAL HIGH (ref 8–23)
CO2: 21 mmol/L — ABNORMAL LOW (ref 22–32)
CO2: 23 mmol/L (ref 22–32)
Calcium: 8.9 mg/dL (ref 8.9–10.3)
Calcium: 9 mg/dL (ref 8.9–10.3)
Chloride: 102 mmol/L (ref 98–111)
Chloride: 102 mmol/L (ref 98–111)
Creatinine, Ser: 2.25 mg/dL — ABNORMAL HIGH (ref 0.44–1.00)
Creatinine, Ser: 2.15 mg/dL — ABNORMAL HIGH (ref 0.44–1.00)
GFR, Estimated: 22 mL/min — ABNORMAL LOW (ref >60)
GFR, Estimated: 23 mL/min — ABNORMAL LOW (ref >60)
Glucose, Bld: 151 mg/dL — ABNORMAL HIGH (ref 70–99)
Glucose, Bld: 173 mg/dL — ABNORMAL HIGH (ref 70–99)
Potassium: 5.8 mmol/L — ABNORMAL HIGH (ref 3.5–5.1)
Potassium: 4.9 mmol/L (ref 3.5–5.1)
Sodium: 134 mmol/L — ABNORMAL LOW (ref 135–145)
Sodium: 135 mmol/L (ref 135–145)
Total Bilirubin: 1 mg/dL (ref 0.0–1.2)
Total Bilirubin: 1 mg/dL (ref 0.0–1.2)
Total Protein: 6.1 g/dL — ABNORMAL LOW (ref 6.5–8.1)
Total Protein: 5.9 g/dL — ABNORMAL LOW (ref 6.5–8.1)

## 2023-06-04 LAB — RESPIRATORY PANEL BY PCR

## 2023-06-04 LAB — CBC WITH DIFFERENTIAL/PLATELET
Abs Immature Granulocytes: 0.39 10*3/uL — ABNORMAL HIGH (ref 0.00–0.07)
Basophils Absolute: 0 10*3/uL (ref 0.0–0.1)
Basophils Relative: 0 %
Eosinophils Absolute: 0.3 10*3/uL (ref 0.0–0.5)
Eosinophils Relative: 3 %
HCT: 31.1 % — ABNORMAL LOW (ref 36.0–46.0)
Hemoglobin: 9.5 g/dL — ABNORMAL LOW (ref 12.0–15.0)
Immature Granulocytes: 4 %
Lymphocytes Relative: 16 %
Lymphs Abs: 1.8 10*3/uL (ref 0.7–4.0)
MCH: 28.6 pg (ref 26.0–34.0)
MCHC: 30.5 g/dL (ref 30.0–36.0)
MCV: 93.7 fL (ref 80.0–100.0)
Monocytes Absolute: 0.9 10*3/uL (ref 0.1–1.0)
Monocytes Relative: 9 %
Neutro Abs: 7.3 10*3/uL (ref 1.7–7.7)
Neutrophils Relative %: 68 %
Platelets: 183 10*3/uL (ref 150–400)
RBC: 3.32 MIL/uL — ABNORMAL LOW (ref 3.87–5.11)
RDW: 16.5 % — ABNORMAL HIGH (ref 11.5–15.5)
WBC: 10.8 10*3/uL — ABNORMAL HIGH (ref 4.0–10.5)
nRBC: 0 % (ref 0.0–0.2)

## 2023-06-04 LAB — HEMOGLOBIN AND HEMATOCRIT, BLOOD
HCT: 29.2 % — ABNORMAL LOW (ref 36.0–46.0)
Hemoglobin: 9 g/dL — ABNORMAL LOW (ref 12.0–15.0)

## 2023-06-04 LAB — CBG MONITORING, ED
Glucose-Capillary: 140 mg/dL — ABNORMAL HIGH (ref 70–99)
Glucose-Capillary: 82 mg/dL (ref 70–99)
Glucose-Capillary: 173 mg/dL — ABNORMAL HIGH (ref 70–99)

## 2023-06-04 LAB — CBC
HCT: 29.1 % — ABNORMAL LOW (ref 36.0–46.0)
Hemoglobin: 8.6 g/dL — ABNORMAL LOW (ref 12.0–15.0)
MCH: 28.5 pg (ref 26.0–34.0)
MCHC: 29.6 g/dL — ABNORMAL LOW (ref 30.0–36.0)
MCV: 96.4 fL (ref 80.0–100.0)
Platelets: 209 10*3/uL (ref 150–400)
RBC: 3.02 MIL/uL — ABNORMAL LOW (ref 3.87–5.11)
RDW: 16 % — ABNORMAL HIGH (ref 11.5–15.5)
WBC: 13.1 10*3/uL — ABNORMAL HIGH (ref 4.0–10.5)
nRBC: 0.2 % (ref 0.0–0.2)

## 2023-06-04 LAB — GLUCOSE, CAPILLARY
Glucose-Capillary: 152 mg/dL — ABNORMAL HIGH (ref 70–99)
Glucose-Capillary: 220 mg/dL — ABNORMAL HIGH (ref 70–99)

## 2023-06-04 LAB — MAGNESIUM: Magnesium: 1.8 mg/dL (ref 1.7–2.4)

## 2023-06-04 LAB — PREPARE RBC (CROSSMATCH)

## 2023-06-04 LAB — I-STAT CG4 LACTIC ACID, ED: Lactic Acid, Venous: 1.4 mmol/L (ref 0.5–1.9)

## 2023-06-04 LAB — LACTIC ACID, PLASMA
Lactic Acid, Venous: 4.3 mmol/L (ref 0.5–1.9)
Lactic Acid, Venous: 1.9 mmol/L (ref 0.5–1.9)

## 2023-06-04 MED ORDER — HYDROXYZINE HCL 25 MG PO TABS
50.0000 mg | ORAL_TABLET | Freq: Every day | ORAL | Status: DC
  Administered 2023-06-04 – 2023-06-11 (×8): 50 mg via ORAL
  Filled 2023-06-04 (×8): qty 2

## 2023-06-04 MED ORDER — ACETAMINOPHEN 650 MG RE SUPP: 650.0000 mg | Freq: Four times a day (QID) | RECTAL | Status: DC | PRN

## 2023-06-04 MED ORDER — INSULIN ASPART 100 UNIT/ML IJ SOLN
0.0000 [IU] | Freq: Every day | INTRAMUSCULAR | Status: DC
  Administered 2023-06-04: 2 [IU] via SUBCUTANEOUS

## 2023-06-04 MED ORDER — DULOXETINE HCL 30 MG PO CPEP
30.0000 mg | ORAL_CAPSULE | Freq: Every day | ORAL | Status: DC
Start: 2023-06-04 — End: 2023-06-12
  Administered 2023-06-04 – 2023-06-12 (×9): 30 mg via ORAL
  Filled 2023-06-04 (×9): qty 1

## 2023-06-04 MED ORDER — SODIUM CHLORIDE 0.9 % IV SOLN
2.0000 g | INTRAVENOUS | Status: DC
  Administered 2023-06-04 – 2023-06-05 (×2): 2 g via INTRAVENOUS
  Filled 2023-06-04 (×2): qty 12.5

## 2023-06-04 MED ORDER — SODIUM CHLORIDE 0.9% IV SOLUTION: Freq: Once | INTRAVENOUS | Status: AC

## 2023-06-04 MED ORDER — OXYCODONE HCL 5 MG PO TABS
2.5000 mg | ORAL_TABLET | ORAL | Status: DC | PRN
  Administered 2023-06-06 – 2023-06-11 (×5): 2.5 mg via ORAL
  Filled 2023-06-04 (×5): qty 1

## 2023-06-04 MED ORDER — PANTOPRAZOLE SODIUM 40 MG PO TBEC
40.0000 mg | DELAYED_RELEASE_TABLET | Freq: Every day | ORAL | Status: DC
  Administered 2023-06-04 – 2023-06-12 (×9): 40 mg via ORAL
  Filled 2023-06-04 (×9): qty 1

## 2023-06-04 MED ORDER — HYDROMORPHONE HCL 1 MG/ML IJ SOLN
0.5000 mg | INTRAMUSCULAR | Status: DC | PRN
  Administered 2023-06-05 – 2023-06-07 (×6): 0.5 mg via INTRAVENOUS
  Filled 2023-06-04 (×6): qty 0.5

## 2023-06-04 MED ORDER — SODIUM CHLORIDE 0.9% FLUSH
3.0000 mL | Freq: Two times a day (BID) | INTRAVENOUS | Status: DC
  Administered 2023-06-04 – 2023-06-11 (×15): 3 mL via INTRAVENOUS

## 2023-06-04 MED ORDER — LACTATED RINGERS IV SOLN: INTRAVENOUS | Status: DC

## 2023-06-04 MED ORDER — FERROUS SULFATE 325 (65 FE) MG PO TABS
325.0000 mg | ORAL_TABLET | Freq: Two times a day (BID) | ORAL | Status: DC
  Administered 2023-06-04 – 2023-06-11 (×15): 325 mg via ORAL
  Filled 2023-06-04 (×15): qty 1

## 2023-06-04 MED ORDER — ALBUTEROL SULFATE (2.5 MG/3ML) 0.083% IN NEBU: 2.5000 mg | INHALATION_SOLUTION | Freq: Four times a day (QID) | RESPIRATORY_TRACT | Status: DC | PRN

## 2023-06-04 MED ORDER — SODIUM CHLORIDE 0.9 % IV BOLUS
500.0000 mL | Freq: Once | INTRAVENOUS | Status: AC
  Administered 2023-06-04: 500 mL via INTRAVENOUS

## 2023-06-04 MED ORDER — SODIUM CHLORIDE 0.9% FLUSH: 3.0000 mL | INTRAVENOUS | Status: DC | PRN

## 2023-06-04 MED ORDER — ALLOPURINOL 100 MG PO TABS
100.0000 mg | ORAL_TABLET | Freq: Every day | ORAL | Status: DC
  Administered 2023-06-04 – 2023-06-12 (×9): 100 mg via ORAL
  Filled 2023-06-04 (×9): qty 1

## 2023-06-04 MED ORDER — ONDANSETRON HCL 4 MG PO TABS
4.0000 mg | ORAL_TABLET | Freq: Four times a day (QID) | ORAL | Status: DC | PRN
  Administered 2023-06-09 – 2023-06-10 (×2): 4 mg via ORAL
  Filled 2023-06-04 (×2): qty 1

## 2023-06-04 MED ORDER — ATORVASTATIN CALCIUM 10 MG PO TABS
20.0000 mg | ORAL_TABLET | Freq: Every day | ORAL | Status: DC
  Administered 2023-06-04 – 2023-06-12 (×9): 20 mg via ORAL
  Filled 2023-06-04 (×9): qty 2

## 2023-06-04 MED ORDER — VANCOMYCIN HCL 750 MG/150ML IV SOLN
750.0000 mg | INTRAVENOUS | Status: DC
  Filled 2023-06-04: qty 150

## 2023-06-04 MED ORDER — ACETAMINOPHEN 325 MG PO TABS
650.0000 mg | ORAL_TABLET | Freq: Four times a day (QID) | ORAL | Status: DC | PRN
  Administered 2023-06-04 – 2023-06-09 (×5): 650 mg via ORAL
  Filled 2023-06-04 (×5): qty 2

## 2023-06-04 MED ORDER — SODIUM CHLORIDE 0.9% FLUSH
3.0000 mL | Freq: Two times a day (BID) | INTRAVENOUS | Status: DC
  Administered 2023-06-04 – 2023-06-12 (×16): 3 mL via INTRAVENOUS

## 2023-06-04 MED ORDER — IPRATROPIUM-ALBUTEROL 0.5-2.5 (3) MG/3ML IN SOLN: 3.0000 mL | RESPIRATORY_TRACT | Status: DC | PRN

## 2023-06-04 MED ORDER — FLUTICASONE PROPIONATE 50 MCG/ACT NA SUSP
2.0000 | Freq: Every day | NASAL | Status: DC
  Administered 2023-06-05 – 2023-06-12 (×8): 2 via NASAL
  Filled 2023-06-04: qty 16

## 2023-06-04 MED ORDER — OXYCODONE HCL 5 MG PO TABS
5.0000 mg | ORAL_TABLET | ORAL | Status: DC | PRN
  Administered 2023-06-04 – 2023-06-11 (×18): 5 mg via ORAL
  Filled 2023-06-04 (×19): qty 1

## 2023-06-04 MED ORDER — INSULIN ASPART 100 UNIT/ML IJ SOLN
2.0000 [IU] | Freq: Once | INTRAMUSCULAR | Status: AC
  Administered 2023-06-04: 2 [IU] via SUBCUTANEOUS

## 2023-06-04 MED ORDER — SODIUM ZIRCONIUM CYCLOSILICATE 10 G PO PACK
10.0000 g | PACK | Freq: Once | ORAL | Status: AC
  Administered 2023-06-04: 10 g via ORAL
  Filled 2023-06-04: qty 1

## 2023-06-04 MED ORDER — CALCIUM GLUCONATE-NACL 1-0.675 GM/50ML-% IV SOLN
1.0000 g | Freq: Once | INTRAVENOUS | Status: AC
  Administered 2023-06-04: 1000 mg via INTRAVENOUS
  Filled 2023-06-04: qty 50

## 2023-06-04 MED ORDER — VANCOMYCIN HCL 1500 MG/300ML IV SOLN
1500.0000 mg | Freq: Once | INTRAVENOUS | Status: AC
  Administered 2023-06-04: 1500 mg via INTRAVENOUS
  Filled 2023-06-04: qty 300

## 2023-06-04 MED ORDER — ONDANSETRON HCL 4 MG/2ML IJ SOLN
4.0000 mg | Freq: Four times a day (QID) | INTRAMUSCULAR | Status: DC | PRN
  Administered 2023-06-07: 4 mg via INTRAVENOUS
  Filled 2023-06-04: qty 2

## 2023-06-04 MED ORDER — SODIUM CHLORIDE 0.9 % IV SOLN: 250.0000 mL | INTRAVENOUS | Status: AC | PRN

## 2023-06-04 MED ORDER — MOMETASONE FURO-FORMOTEROL FUM 200-5 MCG/ACT IN AERO
2.0000 | INHALATION_SPRAY | Freq: Two times a day (BID) | RESPIRATORY_TRACT | Status: DC
  Administered 2023-06-04 – 2023-06-12 (×17): 2 via RESPIRATORY_TRACT
  Filled 2023-06-04 (×2): qty 8.8

## 2023-06-04 MED ORDER — LACTATED RINGERS IV SOLN: INTRAVENOUS | Status: AC

## 2023-06-04 MED ORDER — ACETAMINOPHEN 10 MG/ML IV SOLN
1000.0000 mg | Freq: Once | INTRAVENOUS | Status: AC
  Administered 2023-06-04: 1000 mg via INTRAVENOUS
  Filled 2023-06-04: qty 100

## 2023-06-04 MED ORDER — INSULIN ASPART 100 UNIT/ML IJ SOLN
0.0000 [IU] | Freq: Three times a day (TID) | INTRAMUSCULAR | Status: DC
  Administered 2023-06-04 – 2023-06-07 (×5): 1 [IU] via SUBCUTANEOUS
  Administered 2023-06-07: 2 [IU] via SUBCUTANEOUS
  Administered 2023-06-08 – 2023-06-11 (×2): 1 [IU] via SUBCUTANEOUS
  Administered 2023-06-12: 2 [IU] via SUBCUTANEOUS

## 2023-06-04 MED ORDER — SODIUM CHLORIDE 0.9 % IV BOLUS
1000.0000 mL | Freq: Once | INTRAVENOUS | Status: AC
  Administered 2023-06-04: 1000 mL via INTRAVENOUS

## 2023-06-04 NOTE — Progress Notes (Signed)
 Pharmacy Antibiotic Note  [name] is a 73 y.o. female admitted on 06/03/2023 with abdominal wall hematoma and concern for sepsis 2/2 pna. Given previous hospitalization where patient received CAP treatment with ceftriaxone  and azithromycin , antibiotics broadened to include HCAP. Pharmacy has been consulted for vancomycin  dosing.  Plan: Vancomycin  1500mg  followed by 750mg  q48h (eAUC 489, Scr 2.25) F/u renal function, infectious work up and length of therapy Vancomycin  levels as needed F/u MRSA PCR  Weight: 64 kg (141 lb 1.5 oz)  Temp (24hrs), Avg:97.5 F (36.4 C), Min:97.4 F (36.3 C), Max:97.6 F (36.4 C)  Recent Labs  Lab 05/28/23 1044 05/30/23 0642 05/31/23 0722 06/01/23 0750 06/03/23 1939  WBC 14.1* 18.2* 13.6* 15.0* 14.9*  CREATININE 2.43* 3.04* 2.95* 2.52* 2.42*    Estimated Creatinine Clearance: 16.3 mL/min (A) (by C-G formula based on SCr of 2.42 mg/dL (H)).    No Known Allergies  Antimicrobials this admission: Cefepime 4/29 > Vancomycin  4/29 >  Microbiology results: 4/29 resp panel: neg 4/29 MRSA PCR: 4/29 Bcx:  Thank you for allowing pharmacy to be a part of this patient's care.  Fonda Hymen 06/04/2023 12:53 AM

## 2023-06-04 NOTE — ED Provider Notes (Signed)
 I have discussed the case with Dr. Sundil, who agrees to come evaluate the patient.   Alissa April, MD 06/04/23 [phone]

## 2023-06-04 NOTE — ED Notes (Signed)
Dr. Pahwani paged 

## 2023-06-04 NOTE — Progress Notes (Addendum)
 PROGRESS NOTE    [name]  WUJ:[id number] DOB: [birth year] DOA: 06/03/2023 PCP: [facility]   Brief Narrative:  [name] is a 73 y.o. female with medical history significant of history of breast cancer, MGUS, COPD, insulin -dependent DM type II, essential hypertension, anxiety, gout, hyperlipidemia, gerd, CKD stage IV, gout, and generalized weakness presented to the hospital with complaining of left-sided chest pain and generalized abdominal pain.   Patient was admitted from 4/18 to 4/27 acute hypoxic respiratory failure and sepsis  in the setting of community-acquired pneumonia and COPD exacerbation.  Patient required Levophed  drip in the setting of hypotension in the context of sepsis, required BiPAP completed course of IV ceftriaxone  azithromycin  for 7 days.  Blood culture from 4/13 no growth so far.  Patient was discharged to home with home PT OT management.   During admitting hospitalist evaluation, she denied any chest pain but complained of abdominal pain.  Reportedly, she has chronic cough.  It was verified that patient is full code.  He was agreeable with blood transfusion if needed.   At presentation to ED patient was hypotensive blood pressure dropped to 84/55.  Required nonrebreather and then weaned to 15 L O2 sat 100%. Normal troponin level 13.  EKG showing normal sinus rhythm heart rate 85.  Was found to have hyperkalemia 5.9 and creatinine 2.42 which is at her baseline. CBC showing persistent leukocytosis 14.9, stable H&H 9.2 and 29.  Chest x-ray shallow aspiration with consolidation with consolidation/atelectasis in the left lung base.   CT abdomen pelvis: IMPRESSION: 1. Extensive intramuscular hematoma within the left anterolateral abdominal wall, measuring up to 3.7 cm within the rectus sheath. 2. Small bilateral fat containing inguinal hernias. 3.  Aortic Atherosclerosis (ICD10-I70.0).   In the ED patient has been treated with 500 mL of LR  and 500 mL of NS bolus, Lokelma  and received fentanyl .  Admitted under hospitalist service.  Assessment & Plan:   Principal Problem:   Abdominal wall hematoma Active Problems:   Insulin  dependent type 2 diabetes mellitus (HCC)   COPD (chronic obstructive pulmonary disease) (HCC)   Left lower lobe pneumonia   Essential hypertension   MGUS (monoclonal gammopathy of unknown significance)   CKD (chronic kidney disease) stage 4, GFR 15-29 ml/min (HCC)   Generalized anxiety disorder   History of gout   Generalized weakness   History of breast cancer   Gout  Abdominal wall hematoma No reported history of trauma. CT abdomen pelvis showed Extensive intramuscular hematoma within the left anterolateral abdominal wall, measuring up to 3.7 cm within the rectus sheath. . Small bilateral fat containing inguinal hernias.  Due to hypotension and hematoma, admitting hospitalist ordered 1 unit of PRBC transfusion which has been completed.  Abdominal binder.  Avoiding pharmacological DVT prophylaxis.  Monitoring CBC closely.   Sepsis secondary to left lower lobe pneumonia has been ruled out/chronic hypoxic respiratory failure dependent on 2 L of oxygen : -Patient has history of end-stage COPD and recent hospitalization for COPD exacerbation, at the verge of requiring intubation however patient declined and she was agreeable to treat with BiPAP.  She uses 2 L of oxygen  chronically.  As of now patient is still full code.  She was treated with ceftriaxone  azithromycin .  Blood culture was negative.  However there is no sputum culture is drawn.  Although chest x-ray shows possibility of consolidation however in the setting of no fever and improving leukocytosis, I am not fully convinced that this is persistent pneumonia.  Patient has been started on very broad-spectrum antibiotics/vancomycin  and cefepime.  Respiratory viral panel is negative as well.  Blood cultures have been drawn which are negative so far.  Sputum  culture is also ordered.  I am going to continue antibiotics for another 24 hours and follow culture and if no clinical evidence of pneumonia, will likely discontinue antibiotics.  Hypotension: Patient has been having persistent hypotension however upon chart review, it appears that patient did have intermittent hypotension during recent hospitalization as well given after she was weaned off of vasopressors.  Patient being asymptomatic and lactic acid normal is reassuring.  We will be monitoring her hemoglobin very closely.  Her recent blood pressure is 101/64 which is reassuring as well.  She is on continuous "Ringer's"  lactate at 125 cc/h which I will continue and monitor closely. Addendum: We rechecked her potassium which was elevated at 4.3.  Ordered 1 L of IV fluid bolus, repeat lactic acid is 1.9.  Patient is now continuing to receive "Ringer's"  lactate at 125 cc/h which we will continue for next 15 hours from now.   Hyperkalemia Potassium 5.9 upon arrival, she received dextrose , insulin  and Lokelma .  Potassium 4.9 now.  Will give her another dose of Lokelma  to prevent hyperkalemia.   Left-sided chest pain - Normal troponin.  EKG unremarkable. - Left-sided chest wall pain is noncardiac origin in the setting of referred pain from left-sided abdominal wall hematoma.   COPD without acute exacerbation - Initially patient required nonrebreather due to shortness of breath currently maintaining O2 sat over 95% on 2 L oxygen .  Physical exam no evidence of wheezing shortness of breath.  Patient is still complaining about cough. - Continue DuoNeb as needed and Dulera  twice daily.   Essential hypertension -Holding home blood pressure regimen in the setting of hypotension.   CKD stage IV -CMP showed creatinine 2.42 and GFR 20.  Renal function at baseline.  Continue to monitor renal function, avoid nephrotoxic agent, monitor urine output.   Generalized anxiety disorder -Continue Atarax  as needed.  Resume  Cymbalta .   Gout -Continue allopurinol    Hyperlipidemia - Continue Lipitor   History of breast cancer History of MGUS -Waiting for pharmacy verification if patient has been taking anastrozole .   Insulin -dependent DM type II - A1c 8.5 checked in 05/2023.  Currently on SSI.  Slightly hypoglycemic this morning.  Will continue current management.   Generalized weakness - Need to resume home health on discharge to continue the home PT and OT.  DVT prophylaxis: SCDs Start: 06/04/23 0043 Place TED hose Start: 06/04/23 0043   Code Status: Full Code  Family Communication:  None present at bedside.  Plan of care discussed with patient in length and he/she verbalized understanding and agreed with it.  Status is: Inpatient Remains inpatient appropriate because: Patient hypotensive with hematoma.  Needs further monitoring.   Estimated body mass index is 28.5 kg/m as calculated from the following:   Height as of 05/23/23: 4\' 11"  (1.499 m).   Weight as of this encounter: 64 kg.    Nutritional Assessment: Body mass index is 28.5 kg/m.Aaron Aas Seen by dietician.  I agree with the assessment and plan as outlined below: Nutrition Status:        . Skin Assessment: I have examined the patient's skin and I agree with the wound assessment as performed by the wound care RN as outlined below:    Consultants:  None  Procedures:  None  Antimicrobials:  Anti-infectives (From admission, onward)  Start     Dose/Rate Route Frequency Ordered Stop   06/06/23 0500  vancomycin  (VANCOREADY) IVPB 750 mg/150 mL        750 mg 150 mL/hr over 60 Minutes Intravenous Every 48 hours 06/04/23 0313     06/04/23 0100  ceFEPIme (MAXIPIME) 2 g in sodium chloride  0.9 % 100 mL IVPB        2 g 200 mL/hr over 30 Minutes Intravenous Every 24 hours 06/04/23 0057     06/04/23 0100  vancomycin  (VANCOREADY) IVPB 1500 mg/300 mL        1,500 mg 150 mL/hr over 120 Minutes Intravenous  Once 06/04/23 0058 06/04/23 0346          Subjective: Patient seen and examined.  Now complains of the right lower abdominal pain due to insulin  injections.  However on examination, she is tender to palpation throughout the abdomen but more so on the left lower quadrant.  She is fully alert and oriented and saturating well on 2 L.  She has no other complaint.  Objective: Vitals:   06/04/23 0938 06/04/23 1007 06/04/23 1020 06/04/23 1105  BP:  (!) 76/55 (!) 86/67 101/64  Pulse:  79 73 72  Resp:  15 12 16   Temp: 98 F (36.7 C)     TempSrc: Oral     SpO2:  98% 100% 100%  Weight:        Intake/Output Summary (Last 24 hours) at 06/04/2023 1127 Last data filed at 06/04/2023 0537 Gross per 24 hour  Intake 1836.77 ml  Output 400 ml  Net 1436.77 ml   Filed Weights   06/03/23 1936  Weight: 64 kg    Examination:  General exam: Appears calm and comfortable  Respiratory system: Clear to auscultation.  Poor inspiratory effort. Cardiovascular system: S1 & S2 heard, RRR. No JVD, murmurs, rubs, gallops or clicks. No pedal edema. Gastrointestinal system: Abdomen is nondistended, soft and nontender. No organomegaly or masses felt. Normal bowel sounds heard.  Multiple bruises in the abdomen. Central nervous system: Alert and oriented. No focal neurological deficits. Extremities: Symmetric 5 x 5 power. Skin: No rashes, lesions or ulcers   Data Reviewed: I have personally reviewed following labs and imaging studies  CBC: Recent Labs  Lab 05/30/23 0642 05/31/23 0722 06/01/23 0750 06/03/23 1939 06/04/23 0110  WBC 18.2* 13.6* 15.0* 14.9* 13.1*  HGB 9.9* 10.0* 9.0* 9.2* 8.6*  HCT 31.7* 32.6* 28.2* 29.6* 29.1*  MCV 91.4 91.3 91.3 94.0 96.4  PLT 259 242 217 232 209   Basic Metabolic Panel: Recent Labs  Lab 05/31/23 0722 06/01/23 0750 06/03/23 1939 06/04/23 0110 06/04/23 0958  NA 136 135 135 134* 135  K 5.0 5.0 5.9* 5.8* 4.9  CL 102 100 98 102 102  CO2 24 24 24  21* 23  GLUCOSE 105* 217* 178* 151* 173*  BUN  70* 60* 49* 47* 42*  CREATININE 2.95* 2.52* 2.42* 2.25* 2.15*  CALCIUM  8.9 9.1 9.3 8.9 9.0  MG  --   --   --   --  1.8   GFR: Estimated Creatinine Clearance: 18.4 mL/min (A) (by C-G formula based on SCr of 2.15 mg/dL (H)). Liver Function Tests: Recent Labs  Lab 06/01/23 0750 06/03/23 1939 06/04/23 0110 06/04/23 0958  AST 15 26 20 21   ALT 15 27 24 24   ALKPHOS 75 84 78 68  BILITOT 0.7 0.9 1.0 1.0  PROT 6.0* 6.9 6.1* 5.9*  ALBUMIN  2.9* 3.2* 2.9* 2.7*   No results for input(s): "LIPASE", "  AMYLASE" in the last 168 hours. No results for input(s): "AMMONIA" in the last 168 hours. Coagulation Profile: No results for input(s): "INR", "PROTIME" in the last 168 hours. Cardiac Enzymes: No results for input(s): "CKTOTAL", "CKMB", "CKMBINDEX", "TROPONINI" in the last 168 hours. BNP (last 3 results) No results for input(s): "PROBNP" in the last 8760 hours. HbA1C: No results for input(s): "HGBA1C" in the last 72 hours. CBG: Recent Labs  Lab 06/01/23 2016 06/02/23 0802 06/02/23 1127 06/04/23 0205 06/04/23 0758  GLUCAP 104* 112* 113* 140* 82   Lipid Profile: No results for input(s): "CHOL", "HDL", "LDLCALC", "TRIG", "CHOLHDL", "LDLDIRECT" in the last 72 hours. Thyroid  Function Tests: No results for input(s): "TSH", "T4TOTAL", "FREET4", "T3FREE", "THYROIDAB" in the last 72 hours. Anemia Panel: No results for input(s): "VITAMINB12", "FOLATE", "FERRITIN", "TIBC", "IRON ", "RETICCTPCT" in the last 72 hours. Sepsis Labs: Recent Labs  Lab 06/04/23 0121  LATICACIDVEN 1.4    Recent Results (from the past 240 hours)  Respiratory (~20 pathogens) panel by PCR     Status: None   Collection Time: 06/04/23  1:01 AM   Specimen: Nasopharyngeal Swab; Respiratory  Result Value Ref Range Status   Adenovirus NOT DETECTED NOT DETECTED Final   Coronavirus 229E NOT DETECTED NOT DETECTED Final    Comment: (NOTE) The Coronavirus on the Respiratory Panel, DOES NOT test for the novel  Coronavirus  (2019 nCoV)    Coronavirus HKU1 NOT DETECTED NOT DETECTED Final   Coronavirus NL63 NOT DETECTED NOT DETECTED Final   Coronavirus OC43 NOT DETECTED NOT DETECTED Final   Metapneumovirus NOT DETECTED NOT DETECTED Final   Rhinovirus / Enterovirus NOT DETECTED NOT DETECTED Final   Influenza A NOT DETECTED NOT DETECTED Final   Influenza B NOT DETECTED NOT DETECTED Final   Parainfluenza Virus 1 NOT DETECTED NOT DETECTED Final   Parainfluenza Virus 2 NOT DETECTED NOT DETECTED Final   Parainfluenza Virus 3 NOT DETECTED NOT DETECTED Final   Parainfluenza Virus 4 NOT DETECTED NOT DETECTED Final   Respiratory Syncytial Virus NOT DETECTED NOT DETECTED Final   Bordetella pertussis NOT DETECTED NOT DETECTED Final   Bordetella Parapertussis NOT DETECTED NOT DETECTED Final   Chlamydophila pneumoniae NOT DETECTED NOT DETECTED Final   Mycoplasma pneumoniae NOT DETECTED NOT DETECTED Final    Comment: Performed at [facility], [address] [place], [place] [address]  Culture, blood (routine x 2) Call MD if unable to obtain prior to antibiotics being given     Status: None (Preliminary result)   Collection Time: 06/04/23  1:10 AM   Specimen: BLOOD LEFT FOREARM  Result Value Ref Range Status   Specimen Description BLOOD LEFT FOREARM  Final   Special Requests   Final    BOTTLES DRAWN AEROBIC ONLY Blood Culture results may not be optimal due to an inadequate volume of blood received in culture bottles   Culture   Final    NO GROWTH < 12 HOURS Performed at [facility], [address] [place], [place], [place] [address]    Report Status PENDING  Incomplete     Radiology Studies: CT ABDOMEN PELVIS WO CONTRAST Result Date: 06/03/2023 CLINICAL DATA:  Chest pain, arm pain, abdominal pain EXAM: CT ABDOMEN AND PELVIS WITHOUT CONTRAST TECHNIQUE: Multidetector CT imaging of the abdomen and pelvis was performed following the standard protocol without IV contrast. Unenhanced CT was performed per clinician  order. Lack of IV contrast limits sensitivity and specificity, especially for evaluation of abdominal/pelvic solid viscera. RADIATION DOSE REDUCTION: This  exam was performed according to the departmental dose-optimization program which includes automated exposure control, adjustment of the mA and/or kV according to patient size and/or use of iterative reconstruction technique. COMPARISON:  06/03/2023, 06/01/2023, 11/02/2022 FINDINGS: Lower chest: Mild dependent lower lobe atelectasis. No acute airspace disease. Cardiomegaly without pericardial effusion. Hepatobiliary: Cholecystectomy. Unremarkable unenhanced appearance of the liver. Pancreas: Unremarkable unenhanced appearance. Spleen: Unremarkable unenhanced appearance. Adrenals/Urinary Tract: No urinary tract calculi or obstructive uropathy within either kidney. The adrenals and bladder are unremarkable. Stomach/Bowel: No bowel obstruction or ileus. Postsurgical changes from prior bowel resection and ileocolic anastomosis right lower quadrant. No bowel wall thickening or inflammatory change. Mild retained stool within the proximal colon. Vascular/Lymphatic: Aortic atherosclerosis. No enlarged abdominal or pelvic lymph nodes. Reproductive: Uterus and bilateral adnexa are unremarkable. Other: No free fluid or free intraperitoneal gas. Small bilateral fat containing inguinal hernias. Musculoskeletal: Intramuscular hematoma within the left anterolateral abdomen, measuring up to 3.7 cm within the rectus sheath. The intramuscular hematoma extends along the transversus abdominal muscles as well, and extends approximately 20 cm in craniocaudal extent. Mild subcutaneous fat stranding within the left lateral abdomen. No acute or destructive bony abnormalities. Reconstructed images demonstrate no additional findings. IMPRESSION: 1. Extensive intramuscular hematoma within the left anterolateral abdominal wall, measuring up to 3.7 cm within the rectus sheath. 2. Small  bilateral fat containing inguinal hernias. 3.  Aortic Atherosclerosis (ICD10-I70.0). Critical Value/emergent results were called by telephone at the time of interpretation on 06/03/2023 at 10:05 pm to provider MELANIE BELFI , who verbally acknowledged these results. Electronically Signed   By: Bobbye Burrow M.D.   On: 06/03/2023 22:06   DG Chest Port 1 View Result Date: 06/03/2023 CLINICAL DATA:  Chest pain. Left arm pain. Decreased oxygen  saturation. EXAM: PORTABLE CHEST 1 VIEW COMPARISON:  05/31/2023 FINDINGS: Shallow inspiration. Heart size and pulmonary vascularity are normal for technique. Consolidation or atelectasis in the left base. No pleural effusion or pneumothorax is identified. Calcification of the aorta. Mediastinal contours appear intact. Degenerative changes in the right shoulder and spine. Postoperative changes in the left shoulder. IMPRESSION: Shallow inspiration with consolidation or atelectasis in the left base. Electronically Signed   By: Boyce Byes M.D.   On: 06/03/2023 20:24    Scheduled Meds:  allopurinol   100 mg Oral Daily   atorvastatin   20 mg Oral Daily   ferrous sulfate   325 mg Oral BID WC   hydrOXYzine   50 mg Oral QHS   insulin  aspart  0-5 Units Subcutaneous QHS   insulin  aspart  0-6 Units Subcutaneous TID WC   mometasone -formoterol   2 puff Inhalation BID   pantoprazole   40 mg Oral Daily   sodium chloride  flush  3 mL Intravenous Q12H   sodium chloride  flush  3 mL Intravenous Q12H   Continuous Infusions:  sodium chloride      ceFEPime (MAXIPIME) IV Stopped (06/04/23 0144)   lactated ringers  125 mL/hr at 06/04/23 0930   [START ON 06/06/2023] vancomycin        LOS: 0 days   Modena Andes, MD [facility]  06/04/2023, 11:27 AM  Total time spent: 50 minutes  *Please note that this is a verbal dictation therefore any spelling or grammatical errors are due to the "[employer]agon [employer] One" system interpretation.  Please page via Amion and do not message via  secure chat for urgent patient care matters. Secure chat can be used for non urgent patient care matters.  How to contact the [facility] Attending or Consulting provider 7A - 7P or covering  provider during after hours 7P -7A, for this patient?  Check the care team in [facility] and look for a) attending/consulting [facility] provider listed and b) the [facility] team listed. Page or secure chat 7A-7P. Log into [email or web address] and use [facility] universal password to access. If you do not have the password, please contact the hospital operator. Locate the [facility] provider you are looking for under [facility] and page to a number that you can be directly reached. If you still have difficulty reaching the provider, please page the [facility] (Director on Call) for the Hospitalists listed on amion for assistance.

## 2023-06-04 NOTE — Plan of Care (Signed)
  Problem: Health Behavior/Discharge Planning: Goal: Ability to manage health-related needs will improve Outcome: Progressing   Problem: Nutritional: Goal: Maintenance of adequate nutrition will improve Outcome: Progressing   Problem: Skin Integrity: Goal: Risk for impaired skin integrity will decrease Outcome: Progressing   Problem: Pain Managment: Goal: General experience of comfort will improve and/or be controlled Outcome: Progressing   Problem: Safety: Goal: Ability to remain free from injury will improve Outcome: Progressing   Problem: Skin Integrity: Goal: Risk for impaired skin integrity will decrease Outcome: Progressing   Problem: Activity: Goal: Ability to tolerate increased activity will improve Outcome: Progressing

## 2023-06-04 NOTE — ED Notes (Signed)
 Iv team at bedside

## 2023-06-04 NOTE — ED Notes (Addendum)
 Message sent via amion Dr. Lilyan Remedies regarding hypotension

## 2023-06-04 NOTE — ED Notes (Signed)
 Granddaughter Leetta Pulse [phone] would like an update asap

## 2023-06-04 NOTE — H&P (Addendum)
 History and Physical    [name] UJW:[id number] DOB: Nov 18, 1950 DOA: 06/03/2023  PCP: [facility]   Patient coming from: Home   Chief Complaint:  Chief Complaint  Patient presents with   Chest Pain   Arm Pain   ED TRIAGE note:  Patient BIB [facility] c/o left arm pain and chest pain since yesterday.  Patient just d/c'd from hospital admission  yesterday.  Patient placed on NRB at 15L because [facility] stated her sats kept dropping and her bp was "soft."      HPI:  [name] is a 73 y.o. female with medical history significant of history of breast cancer, MGUS, COPD, insulin -dependent DM type II, essential hypertension, anxiety, gout, hyperlipidemia, gerd, CKD stage IV, gout, and generalized weakness who has been just discharged from the hospital 4/27 again coming to the hospital with complaining of left-sided chest pain and generalized abdominal pain.  Patient was admitted from 4/18 to 4/27 acute hypoxic respiratory failure and sepsis  in the setting of community-acquired pneumonia and COPD exacerbation.  Patient required Levophed  drip in the setting of hypotension in the context of sepsis, required BiPAP completed course of IV ceftriaxone  azithromycin  for 7 days.  Blood culture from 4/13 no growth so far.  Patient was discharged to home with home PT OT management.  During my evaluation at the bedside patient is complaining about left-sided abdominal wall pain on palpation. Patient denies any chest pain, palpitation, shortness of breath. Spoke with patient sister [name] per phone who stated that patient continues to have chronic cough at home. Physical exam did not reveal any evidence of wheezing. Discussed CODE STATUS with patient's sister over phone as patient's son is not available.  Verified-patient is full code. Also patient sister is agreeable for blood transfusion.  Patient is not a Jehovah witness.   ED Course:  At presentation to ED patient is  hypotensive blood pressure dropped to 84/55.  Required nonrebreather currently on 15 L O2 sat 100%. Normal troponin level 13.  EKG showing normal sinus rhythm heart rate 85.  CMP showing elevated potassium 5.9, elevated BUN 49, creatinine 2.42 which is around baseline, GFR 20, low albumin  3.2. CBC showing persistent leukocytosis 14.9, stable H&H 9.2 and 29.  Normal platelet count.  Chest x-ray shallow aspiration with consolidation with consolidation/atelectasis in the left lung base.  CT abdomen pelvis: IMPRESSION: 1. Extensive intramuscular hematoma within the left anterolateral abdominal wall, measuring up to 3.7 cm within the rectus sheath. 2. Small bilateral fat containing inguinal hernias. 3.  Aortic Atherosclerosis (ICD10-I70.0).  In the ED patient has been treated with 500 mL of LR and 500 mL of NS bolus, Lokelma  and received fentanyl .  Currently patient is hypotensive however there is no evidence of respiratory distress/COPD exacerbation.  Treated her oxygen  O2 sat 100%.  However blood pressure is borderline soft.   Significant labs in the ED: Lab Orders         Culture, blood (routine x 2) Call MD if unable to obtain prior to antibiotics being given         Expectorated Sputum Assessment w Gram Stain, Rflx to Resp Cult         Respiratory (~20 pathogens) panel by PCR         CBC         Comprehensive metabolic panel         Strep pneumoniae urinary antigen         Legionella Pneumophila Serogp  1 Ur Ag         Comprehensive metabolic panel         CBC       Review of Systems:  Review of Systems  Constitutional:  Negative for chills, fever, malaise/fatigue and weight loss.  Respiratory:  Positive for cough and sputum production. Negative for shortness of breath and wheezing.   Cardiovascular:  Negative for chest pain.  Gastrointestinal:  Positive for abdominal pain. Negative for blood in stool, constipation, diarrhea, heartburn, melena, nausea and vomiting.        Left-sided abdominal wall pain  Genitourinary:  Negative for dysuria.  Musculoskeletal:  Negative for back pain, falls, joint pain, myalgias and neck pain.  Neurological:  Negative for dizziness and headaches.  Psychiatric/Behavioral:  The patient is not nervous/anxious.     Past Medical History:  Diagnosis Date   Ambulates with cane    straight   Angina    no current problems per patient at PAT appt 11/05/18, nitroglycerin  sl 06/30/20   Arthritis    HANDS"   Asthma    Breast cancer (HCC)    Cancer (HCC) 09/04/2017   Right Breast   Carpal tunnel syndrome, bilateral 11/26/2016   Cholelithiasis    Cocaine abuse (HCC) 07/2012   per E[place] drug screen   COPD (chronic obstructive pulmonary disease) (HCC)    per patient   Coronary artery disease    Diabetes mellitus without mention of complication    type 2   Dysphagia    esophageal dysmotility on 03/2011 esophagram    Fatty liver disease, nonalcoholic 2009   on Imaging.    GERD (gastroesophageal reflux disease)    Headache    Hearing loss    bilateral - no hearing aids   History of colonic polyps 2009   adenomatous 2009, HP in 2009, 2011, 2013.    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   Wears dentures    full upper and partial lower   Wears glasses     Past Surgical History:  Procedure Laterality Date   ABDOMINAL ANGIOGRAM  02/20/2011   Procedure: ABDOMINAL ANGIOGRAM;  Surgeon: Sharene Dauer, MD;  Location: [facility] CATH LAB;  Service: Cardiovascular;;   BREAST BIOPSY Left    CARPAL TUNNEL RELEASE Right 07/04/2017   Procedure: RIGHT CARPAL TUNNEL RELEASE;  Surgeon: Brunilda Capra, MD;  Location: [facility];  Service: Orthopedics;  Laterality: Right;   CARPAL TUNNEL RELEASE Left 09/12/2017   Procedure: LEFT CARPAL TUNNEL RELEASE;  Surgeon: Brunilda Capra, MD;   Location: [facility];  Service: Orthopedics;  Laterality: Left;   CATARACT EXTRACTION     CHOLECYSTECTOMY N/A 11/11/2018   Procedure: ATTEMPTED LAPAROSCOPIC CHOLECUSTECOMY,  OPEN CHOLECYSTECTOMY;  Surgeon: Oza Blumenthal, MD;  Location: [facility] OR;  Service: General;  Laterality: N/A;   COLONOSCOPY  11/30/2011   Procedure: COLONOSCOPY;  Surgeon: Pietro Bridegroom, MD;  Location: [facility] END[facility];  Service: Endoscopy;  Laterality: N/A;   CORONARY ANGIOPLASTY WITH STENT PLACEMENT  02/20/2011   ERCP N/A 10/03/2018   Procedure: ENDOSCOPIC RETROGRADE CHOLANGIOPANCREATOGRAPHY (ERCP);  Surgeon: Asencion Blacksmith, MD;  Location: [facility];  Service: Endoscopy;  Laterality: N/A;   ESOPHAGOGASTRODUODENOSCOPY  11/30/2011   Procedure: ESOPHAGOGASTRODUODENOSCOPY (EGD);  Surgeon: Pietro Bridegroom, MD;  Location: [facility];  Service:  Endoscopy;  Laterality: N/A;   ESOPHAGOGASTRODUODENOSCOPY N/A 02/23/2015   Procedure: ESOPHAGOGASTRODUODENOSCOPY (EGD);  Surgeon: Kenney Peacemaker, MD;  Location: [facility] ENDOSCOPY;  Service: Endoscopy;  Laterality: N/A;   EYE SURGERY Bilateral    cataracts removed   INTRAOPERATIVE CHOLANGIOGRAM N/A 11/11/2018   Procedure: Intraoperative Cholangiogram;  Surgeon: Oza Blumenthal, MD;  Location: [facility] OR;  Service: General;  Laterality: N/A;   IR REMOVAL TUN ACCESS W/ PORT W/O FL MOD SED  06/30/2020   LEFT HEART CATHETERIZATION WITH CORONARY ANGIOGRAM N/A 02/20/2011   Procedure: LEFT HEART CATHETERIZATION WITH CORONARY ANGIOGRAM;  Surgeon: Sharene Dauer, MD;  Location: [facility] CATH LAB;  Service: Cardiovascular;  Laterality: N/A;   MASTECTOMY Right 11/05/2017   MASTECTOMY W/ SENTINEL NODE BIOPSY Right 11/27/2017   MASTECTOMY W/ SENTINEL NODE BIOPSY Right 11/27/2017   Procedure: RIGHT TOTAL MASTECTOMY WITH SENTINEL LYMPH NODE BIOPSY;  Surgeon: Ayesha Lente, MD;  Location: [facility] OR;  Service: General;  Laterality: Right;   PORT A CATH REVISION Left 05/07/2018   Procedure: PORT A  CATH REVISION;  Surgeon: Ayesha Lente, MD;  Location: [facility];  Service: General;  Laterality: Left;   PORTACATH PLACEMENT Left 11/27/2017   Procedure: INSERTION PORT-A-CATH;  Surgeon: Ayesha Lente, MD;  Location: [facility];  Service: General;  Laterality: Left;   REVERSE SHOULDER ARTHROPLASTY Left 03/09/2021   Procedure: REVERSE SHOULDER ARTHROPLASTY;  Surgeon: Ellard Gunning, MD;  Location: [facility];  Service: Orthopedics;  Laterality: Left;   RIGHT COLECTOMY  02/2007   for post polypectomy colonic perforation   SPHINCTEROTOMY  10/03/2018   Procedure: SPHINCTEROTOMY;  Surgeon: Asencion Blacksmith, MD;  Location: [facility];  Service: Endoscopy;;   TUBAL LIGATION       reports that she quit smoking about 13 years ago. Her smoking use included cigarettes. She started smoking about 60 years ago. She has a 11.8 pack-year smoking history. She has never used smokeless tobacco. She reports that she does not currently use alcohol. She reports that she does not currently use drugs after having used the following drugs: Cocaine.  No Known Allergies  Family History  Problem Relation Age of Onset   Heart disease Mother    Diabetes Mother    Cancer Father        unsure what kind   Diabetes Sister    Colon cancer Neg Hx    Breast cancer Neg Hx     Prior to Admission medications   Medication Sig Start Date End Date Taking? Authorizing Provider  acetaminophen  (TYLENOL ) 650 MG CR tablet Take 650 mg by mouth every 8 (eight) hours as needed for pain.    [provider]  albuterol  (PROVENTIL ) (2.5 MG/3ML) 0.083% nebulizer solution Take 3 mLs (2.5 mg total) by nebulization every 6 (six) hours as needed for wheezing or shortness of breath. 08/02/21   Angelia Kelp, PA-C  albuterol  (VENTOLIN  HFA) 108 (90 Base) MCG/ACT inhaler Inhale 1-2 puffs into the lungs every 6 (six) hours as needed for wheezing or shortness of breath. 09/14/21   Hassie Lint, PA-C  allopurinol  (ZYLOPRIM ) 100 MG  tablet Take 100 mg by mouth daily.    [provider]  anastrozole  (ARIMIDEX ) 1 MG tablet TAKE ONE TABLET BY MOUTH ONCE DAILY 06/18/22   Cameron Cea, MD  aspirin  81 MG EC tablet Take 1 tablet (81 mg total) by mouth daily. Patient taking differently: Take 81 mg by mouth in the morning and at bedtime. 10/13/18   Pokhrel, Amador Bad, MD  atorvastatin  (LIPITOR)  20 MG tablet Take 20 mg by mouth daily.    [provider]  Blood Pressure Monitor DEVI Please provide patient with insurance approved blood pressure monitor. I10.0 11/15/20   Fleming, Zelda W, NP  diclofenac  Sodium (VOLTAREN ) 1 % GEL Apply 1 Application topically 4 (four) times daily. Patient taking differently: Apply 1 Application topically in the morning and at bedtime. 09/13/21   Gudena, Vinay, MD  DULoxetine  (CYMBALTA ) 30 MG capsule Take 30 mg by mouth daily.    [provider]  feeding supplement, GLUCERNA SHAKE, (GLUCERNA SHAKE) LIQD Take 237 mLs by mouth 2 (two) times daily between meals. 06/02/23   Doroteo Gasmen, MD  ferrous sulfate  325 (65 FE) MG tablet Take 1 tablet (325 mg total) by mouth 2 (two) times daily with a meal. 06/02/23   Doroteo Gasmen, MD  fluticasone  (FLONASE ) 50 MCG/ACT nasal spray Place 2 sprays into both nostrils daily. 05/10/21   Newlin, Enobong, MD  gabapentin  (NEURONTIN ) 100 MG capsule Take 100-200 mg by mouth See admin instructions. Take one tablet by mouth in the morning and afternoon, then take 2 tablets every night per patient 03/12/22   [provider]  glipiZIDE  2.5 MG TABS Take 2.5 mg by mouth daily before breakfast. 06/02/23   Doroteo Gasmen, MD  hydrOXYzine  (ATARAX ) 25 MG tablet Take 50 mg by mouth at bedtime. 05/07/23   [provider]  Insulin  Pen Needle (EASY TOUCH PEN NEEDLES) 31G X 8 MM MISC Use to inject insulin  once daily and Victoza  once daily. Max of 2 pen needles a day. Must have office visit for refills 03/28/22   Newlin, Enobong, MD  methocarbamol   (ROBAXIN ) 500 MG tablet Take 1 tablet (500 mg total) by mouth 2 (two) times daily. 03/09/23   Prosperi, Christian H, PA-C  Misc. Devices MISC Please provide patient with nebulizer mask and tubing. [id number] 12/14/19   Fleming, Zelda W, NP  nitroGLYCERIN  (NITROSTAT ) 0.4 MG SL tablet Place 1 tablet (0.4 mg total) under the tongue every 5 (five) minutes as needed for chest pain. 03/15/20   Fleming, Zelda W, NP  pantoprazole  (PROTONIX ) 40 MG tablet TAKE ONE TABLET BY MOUTH EVERY DAY FOR ACID REFLUX 11/02/21   Fleming, Zelda W, NP  polyethylene glycol (MIRALAX  / GLYCOLAX ) 17 g packet Take 17 g by mouth daily. 06/03/23   Doroteo Gasmen, MD  Respiratory Therapy Supplies (NEBULIZER MASK ADULT) MISC 1 Units by Does not apply route daily as needed. ICD 10 J44.9 05/08/21   Fleming, Zelda W, NP  senna (SENOKOT) 8.6 MG TABS tablet Take 1 tablet (8.6 mg total) by mouth daily as needed for mild constipation or moderate constipation. 06/02/23   Doroteo Gasmen, MD  SYMBICORT  160-4.5 MCG/ACT inhaler Inhale 2 puffs into the lungs in the morning and at bedtime. 02/28/22   [provider]     Physical Exam: Vitals:   06/03/23 2345 06/04/23 0000 06/04/23 0015 06/04/23 0115  BP: 93/66 (!) 84/59 (!) 86/59 92/62  Pulse: 74 74 69 77  Resp: 18 12 12 13   Temp:      TempSrc:      SpO2: 100% 100% 100% 99%  Weight:        Physical Exam Constitutional:      Appearance: She is not ill-appearing.  Cardiovascular:     Rate and Rhythm: Regular rhythm. Tachycardia present.     Heart sounds: Normal heart sounds.  Pulmonary:     Effort: Pulmonary effort is normal.  Breath sounds: No decreased breath sounds, wheezing, rhonchi or rales.  Musculoskeletal:     Right lower leg: No edema.     Left lower leg: No edema.  Skin:    General: Skin is dry.     Capillary Refill: Capillary refill takes less than 2 seconds.  Neurological:     Mental Status: She is alert and oriented to person, place, and time.   Psychiatric:        Mood and Affect: Mood normal.      Labs on Admission: I have personally reviewed following labs and imaging studies  CBC: Recent Labs  Lab 05/30/23 0642 05/31/23 0722 06/01/23 0750 06/03/23 1939 06/04/23 0110  WBC 18.2* 13.6* 15.0* 14.9* 13.1*  HGB 9.9* 10.0* 9.0* 9.2* 8.6*  HCT 31.7* 32.6* 28.2* 29.6* 29.1*  MCV 91.4 91.3 91.3 94.0 96.4  PLT 259 242 217 232 209   Basic Metabolic Panel: Recent Labs  Lab 05/30/23 0642 05/31/23 0722 06/01/23 0750 06/03/23 1939 06/04/23 0110  NA 134* 136 135 135 134*  K 5.0 5.0 5.0 5.9* 5.8*  CL 100 102 100 98 102  CO2 23 24 24 24  21*  GLUCOSE 276* 105* 217* 178* 151*  BUN 74* 70* 60* 49* 47*  CREATININE 3.04* 2.95* 2.52* 2.42* 2.25*  CALCIUM  8.7* 8.9 9.1 9.3 8.9   GFR: Estimated Creatinine Clearance: 17.6 mL/min (A) (by C-G formula based on SCr of 2.25 mg/dL (H)). Liver Function Tests: Recent Labs  Lab 06/01/23 0750 06/03/23 1939 06/04/23 0110  AST 15 26 20   ALT 15 27 24   ALKPHOS 75 84 78  BILITOT 0.7 0.9 1.0  PROT 6.0* 6.9 6.1*  ALBUMIN  2.9* 3.2* 2.9*   No results for input(s): "LIPASE", "AMYLASE" in the last 168 hours. No results for input(s): "AMMONIA" in the last 168 hours. Coagulation Profile: No results for input(s): "INR", "PROTIME" in the last 168 hours. Cardiac Enzymes: Recent Labs  Lab 06/03/23 1939  TROPONINIHS 13   BNP (last 3 results) Recent Labs    11/02/22 2206 05/23/23 1728  BNP 124.8* 21.5   HbA1C: No results for input(s): "HGBA1C" in the last 72 hours. CBG: Recent Labs  Lab 06/01/23 1131 06/01/23 1734 06/01/23 2016 06/02/23 0802 06/02/23 1127  GLUCAP 155* 79 104* 112* 113*   Lipid Profile: No results for input(s): "CHOL", "HDL", "LDLCALC", "TRIG", "CHOLHDL", "LDLDIRECT" in the last 72 hours. Thyroid  Function Tests: No results for input(s): "TSH", "T4TOTAL", "FREET4", "T3FREE", "THYROIDAB" in the last 72 hours. Anemia Panel: No results for input(s):  "VITAMINB12", "FOLATE", "FERRITIN", "TIBC", "IRON ", "RETICCTPCT" in the last 72 hours. Urine analysis:    Component Value Date/Time   COLORURINE YELLOW 08/23/2021 1032   APPEARANCEUR HAZY (A) 08/23/2021 1032   LABSPEC 1.012 08/23/2021 1032   PHURINE 5.0 08/23/2021 1032   GLUCOSEU NEGATIVE 08/23/2021 1032   HGBUR NEGATIVE 08/23/2021 1032   BILIRUBINUR NEGATIVE 08/23/2021 1032   KETONESUR NEGATIVE 08/23/2021 1032   PROTEINUR NEGATIVE 08/23/2021 1032   UROBILINOGEN 0.2 03/31/2014 2017   NITRITE NEGATIVE 08/23/2021 1032   LEUKOCYTESUR LARGE (A) 08/23/2021 1032    Radiological Exams on Admission: I have personally reviewed images CT ABDOMEN PELVIS WO CONTRAST Result Date: 06/03/2023 CLINICAL DATA:  Chest pain, arm pain, abdominal pain EXAM: CT ABDOMEN AND PELVIS WITHOUT CONTRAST TECHNIQUE: Multidetector CT imaging of the abdomen and pelvis was performed following the standard protocol without IV contrast. Unenhanced CT was performed per clinician order. Lack of IV contrast limits sensitivity and specificity, especially for evaluation of  abdominal/pelvic solid viscera. RADIATION DOSE REDUCTION: This exam was performed according to the departmental dose-optimization program which includes automated exposure control, adjustment of the mA and/or kV according to patient size and/or use of iterative reconstruction technique. COMPARISON:  06/03/2023, 06/01/2023, 11/02/2022 FINDINGS: Lower chest: Mild dependent lower lobe atelectasis. No acute airspace disease. Cardiomegaly without pericardial effusion. Hepatobiliary: Cholecystectomy. Unremarkable unenhanced appearance of the liver. Pancreas: Unremarkable unenhanced appearance. Spleen: Unremarkable unenhanced appearance. Adrenals/Urinary Tract: No urinary tract calculi or obstructive uropathy within either kidney. The adrenals and bladder are unremarkable. Stomach/Bowel: No bowel obstruction or ileus. Postsurgical changes from prior bowel resection and  ileocolic anastomosis right lower quadrant. No bowel wall thickening or inflammatory change. Mild retained stool within the proximal colon. Vascular/Lymphatic: Aortic atherosclerosis. No enlarged abdominal or pelvic lymph nodes. Reproductive: Uterus and bilateral adnexa are unremarkable. Other: No free fluid or free intraperitoneal gas. Small bilateral fat containing inguinal hernias. Musculoskeletal: Intramuscular hematoma within the left anterolateral abdomen, measuring up to 3.7 cm within the rectus sheath. The intramuscular hematoma extends along the transversus abdominal muscles as well, and extends approximately 20 cm in craniocaudal extent. Mild subcutaneous fat stranding within the left lateral abdomen. No acute or destructive bony abnormalities. Reconstructed images demonstrate no additional findings. IMPRESSION: 1. Extensive intramuscular hematoma within the left anterolateral abdominal wall, measuring up to 3.7 cm within the rectus sheath. 2. Small bilateral fat containing inguinal hernias. 3.  Aortic Atherosclerosis (ICD10-I70.0). Critical Value/emergent results were called by telephone at the time of interpretation on 06/03/2023 at 10:05 pm to provider MELANIE BELFI , who verbally acknowledged these results. Electronically Signed   By: Bobbye Burrow M.D.   On: 06/03/2023 22:06   DG Chest Port 1 View Result Date: 06/03/2023 CLINICAL DATA:  Chest pain. Left arm pain. Decreased oxygen  saturation. EXAM: PORTABLE CHEST 1 VIEW COMPARISON:  05/31/2023 FINDINGS: Shallow inspiration. Heart size and pulmonary vascularity are normal for technique. Consolidation or atelectasis in the left base. No pleural effusion or pneumothorax is identified. Calcification of the aorta. Mediastinal contours appear intact. Degenerative changes in the right shoulder and spine. Postoperative changes in the left shoulder. IMPRESSION: Shallow inspiration with consolidation or atelectasis in the left base. Electronically Signed    By: Boyce Byes M.D.   On: 06/03/2023 20:24     EKG: My personal interpretation of EKG shows:     Assessment/Plan: Principal Problem:   Abdominal wall hematoma Active Problems:   Insulin  dependent type 2 diabetes mellitus (HCC)   COPD (chronic obstructive pulmonary disease) (HCC)   Left lower lobe pneumonia   Essential hypertension   MGUS (monoclonal gammopathy of unknown significance)   CKD (chronic kidney disease) stage 4, GFR 15-29 ml/min (HCC)   Generalized anxiety disorder   History of gout   Generalized weakness   History of breast cancer   Gout    Assessment and Plan: Abdominal wall hematoma -Patient present emergency department complaining of left-sided chest wall and left-sided abdominal wall pain. - At presentation to ED patient is hypotensive.  Persistent leukocytosis.  Chest x-ray showed left lower lobe consolidation.  Per chart review patient was discharged from the hospital 4/7 while she required pressor support for septic shock in the setting of pneumonia and required BiPAP in the setting of COPD exacerbation. -CBC showing hemoglobin 9.2 baseline hemoglobin is around 10-11. - Chest x-ray showed left lower lobe pneumonia/consolidation. - CT abdomen pelvis showed Extensive intramuscular hematoma within the left anterolateral abdominal wall, measuring up to 3.7 cm within the rectus sheath. Aaron Aas  Small bilateral fat containing inguinal hernias. -In the setting of persistent hypotension and abdominal wall hematoma transfusing 1 unit of blood. - Applying abdominal binder. - Need to avoid pharmacological DVT prophylaxis and holding home aspirin .. -Patient has extensive pain on palpation and limiting physical movement due to pain.  Will avoid any narcotic given history see office.  COPD. - Giving IV Tylenol  1000 mg one-time dose and continue oral Tylenol  as needed. -Continue to maintain abdominal binder.   Sepsis secondary to left lower lobe pneumonia -Patient has  history of's end-stage COPD and recent hospitalization for COPD exacerbation on the verge of requiring intubation however patient declined and was agreeable to treat with BiPA and choose to be remained full code though.  As of now patient is still full code.  She was treated with ceftriaxone  azithromycin .  Blood culture was negative.  However there is no sputum culture is drawn. - Given patient is persistently hypotensive, CBC show evidence of leukocytosis and chest x-ray showing left lower lobe pneumonia concern for sepsis. - Checking lactic acid level and activated code sepsis. - In the ED patient has been resuscitated with 1 L of LR bolus. - Continue LR 125 cc/h. - Due to recent hospitalization and risk for development of MRSA and Pseudomonas pneumonia starting broad-spectrum antibiotic coverage with IV vancomycin  and cefepime. - Obtaining blood culture, sputum culture, respiratory panel, urine Legionella and urine strep antigen test. Addendum: - Lactic acid within normal range.  Hyperkalemia - Elevated potassium 5.2.  Hyperkalemia in the setting of hemolysis.  In the ED patient has been given Lokelma  10 g.  Given NovoLog  2 unit and calcium  gluconate.  Will follow-up with next BMP check.    Left-sided chest pain - Normal troponin.  EKG unremarkable. - Left-sided chest wall pain is noncardiac origin in the setting of pneumonia and referred pain from left-sided abdominal wall hematoma.  COPD without acute exacerbation - Initially patient required nonrebreather due to shortness of breath currently maintaining O2 sat 100% on 3 L oxygen .  Physical exam no evidence of wheezing shortness of breath.  Patient is still complaining about cough. - Currently managing for pneumonia as mentioned above. - Continue DuoNeb as needed and Dulera  twice daily.  Essential hypertension -Holding home blood pressure regimen in the setting of hypotension.  CKD stage IV -CMP showed creatinine 2.42 and GFR 20.   Renal function at baseline.  Continue to monitor renal function, avoid nephrotoxic agent, monitor urine output.  Generalized anxiety disorder -Continue Atarax  as needed. It is unsure patient has been taking Cymbalta  currently.  Pending medication reconciliation by pharmacy  Gout -Continue allopurinol   Hyperlipidemia - Continue Lipitor  History of breast cancer History of MGUS -Waiting for pharmacy verification if patient has been taking anastrozole .   Insulin -dependent DM type II - A1c 8.5 checked in 05/2023. continue sliding scale insulin  with mealtime.  It is unclear at home patient is being on insulin  regimen or not.  Generalized weakness - Need to resume home health on discharge to continue the home PT and OT.   DVT prophylaxis:  SCDs.  Avoid pharmacological DVT prophylaxis in the setting of abdominal wall hematoma. Code Status:  Full Code..  Discussed CODE STATUS with patient's sister [name] who is available over phone.  Patient is full code.  Sister also agrees to blood transfusion. Unable to reach out patient's son, grandson and niece over the phone at this time. Diet: Heart healthy carb modified diet Family Communication: Patient's sister available over phone. Opportunity  was given to ask question and all questions were answered satisfactorily.  Disposition Plan: Continue to monitor improvement of abdominal pain, hypertension.  Pending respiratory panel, blood culture and sputum culture result. Consults: None at this time Admission status:   Inpatient, Telemetry bed  Severity of Illness: The appropriate patient status for this patient is INPATIENT. Inpatient status is judged to be reasonable and necessary in order to provide the required intensity of service to ensure the patient's safety. The patient's presenting symptoms, physical exam findings, and initial radiographic and laboratory data in the context of their chronic comorbidities is felt to place them at high  risk for further clinical deterioration. Furthermore, it is not anticipated that the patient will be medically stable for discharge from the hospital within 2 midnights of admission.   * I certify that at the point of admission it is my clinical judgment that the patient will require inpatient hospital care spanning beyond 2 midnights from the point of admission due to high intensity of service, high risk for further deterioration and high frequency of surveillance required.Aaron Aas    Karrigan Messamore, MD [facility]  How to contact the [facility] Attending or Consulting provider 7A - 7P or covering provider during after hours 7P -7A, for this patient.  Check the care team in [facility] and look for a) attending/consulting [facility] provider listed and b) the [facility] team listed Log into [email or web address] and use [facility]'s universal password to access. If you do not have the password, please contact the hospital operator. Locate the [facility] provider you are looking for under [facility] and page to a number that you can be directly reached. If you still have difficulty reaching the provider, please page the [facility] (Director on Call) for the Hospitalists listed on amion for assistance.  06/04/2023, 1:52 AM

## 2023-06-04 NOTE — ED Notes (Signed)
 Patient's granddaughter updated via phone with patient's permission.

## 2023-06-04 NOTE — ED Notes (Signed)
 Pt resting with eyes closed; respirations spontaneous, even, unlabored; sitter at bedsidePt resting with eyes closed; respirations spontaneous, even, unlabored

## 2023-06-04 NOTE — ED Notes (Addendum)
 Dr. Lilyan Remedies aware of continued hypotension; plan to check lactic and potentially give additional IVF as indicated; will continue to monitor as able

## 2023-06-04 NOTE — Significant Event (Signed)
 Significant event:   Worsening hypotension, 80/60s. Will give 500 cc IVF and check Hb/HCT. Also added pain medication with Oxycodone  / Dilaudid  for breakthrough   Dominique Larch, MD  [facility]

## 2023-06-04 NOTE — ED Notes (Signed)
 Dr. Jacqulyn Bath at bedside

## 2023-06-04 NOTE — TOC Initial Note (Signed)
 Transition of Care [facility]) - Initial/Assessment Note    Patient Details  Name: [name] MRN: [id number] Date of Birth: 10/25/1950  Transition of Care [facility]) CM/SW Contact:    Omie Bickers, RN Phone Number: 06/04/2023, 4:20 PM  Clinical Narrative:                  Patient just discharged and was set up with Oxygen  and rollator through [employer] and [facility] services through [facility].  TOC will follow this admission for needs for DC.        Patient Goals and CMS Choice            Expected Discharge Plan and Services                                              Prior Living Arrangements/Services                       Activities of Daily Living      Permission Sought/Granted                  Emotional Assessment              Admission diagnosis:  Hematoma [T14.8XXA] Chronic left shoulder pain [M25.512, G89.29] Abdominal wall hematoma [S30.1XXA] Abdominal wall hematoma, initial encounter [S30.1XXA] Patient Active Problem List   Diagnosis Date Noted   Abdominal wall hematoma 06/04/2023   Generalized anxiety disorder 06/04/2023   History of gout 06/04/2023   Generalized weakness 06/04/2023   History of breast cancer 06/04/2023   Gout 06/04/2023   Hematoma 06/04/2023   Uncontrolled type 2 diabetes mellitus with hyperglycemia, with long-term current use of insulin  (HCC) 05/28/2023   Left lower lobe pneumonia 05/24/2023   Hypotension 05/24/2023   CKD (chronic kidney disease) stage 4, GFR 15-29 ml/min (HCC) 05/24/2023   Acute on chronic respiratory failure with hypoxia (HCC) 05/24/2023   Acute coronary syndrome (HCC) 11/03/2022   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   S/P reverse total shoulder arthroplasty, 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   MGUS (monoclonal gammopathy of unknown significance) 11/29/2020   ESRD (end stage renal disease) (HCC) 03/15/2020   Insulin  dependent type 2 diabetes mellitus (HCC) 12/09/2018   COPD (chronic  obstructive pulmonary disease) (HCC) 12/09/2018   Chronic cholecystitis 11/11/2018   Hemobilia    Dilated bile duct    Jaundice    Elevated LFTs    Choledocholithiasis 10/02/2018   CKD stage 3b, GFR 30-44 ml/min (HCC)    Acute cholecystitis    Elevated liver enzymes 09/18/2018   Calculus of gallbladder and bile duct with acute cholecystitis, with obstruction    Melena    Unstable angina (HCC) 07/12/2018   Port-A-Cath in place 02/27/2018   Cancer of overlapping sites of right breast (HCC) 11/27/2017   Malignant neoplasm of upper-outer quadrant of right breast in female, estrogen receptor positive (HCC) 10/03/2017   Carpal tunnel syndrome, bilateral 11/26/2016   Ulnar neuropathy at elbow, 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   Abdominal pain, epigastric    Acute on chronic renal failure (HCC) 02/19/2015   Diarrhea    Nausea with vomiting    Hyperglycemia 04/01/2014   Hyperosmolar non-ketotic state in patient with type 2 diabetes mellitus (HCC) 04/01/2014   Nausea vomiting and diarrhea 04/01/2014   CAD (coronary artery disease) 04/01/2014   Dehydration    Diabetic ketoacidosis  without coma associated with diabetes mellitus due to underlying condition (HCC)    High anion gap metabolic acidosis    Cocaine abuse (HCC)    Toxic encephalopathy-Unlikely metabolic,?psychogenic 08/02/2012   Bereavement 02/02/2012   Depression 02/02/2012   Iron  deficiency anemia, unspecified 11/30/2011   Nonspecific abnormal finding in stool contents 11/30/2011   Benign neoplasm of colon 11/30/2011   Cough 05/14/2011   Acute respiratory failure with hypoxia (HCC) 03/19/2011   Chest pain 03/19/2011   Macrocytic anemia 03/19/2011   Type 2 diabetes mellitus with complication, without long-term current use of insulin  (HCC) 03/19/2011   DOE (dyspnea on exertion) 03/19/2011   Chest pain at rest 02/18/2011    Class: Acute   Cholelithiasis 11/01/2010   DM 04/08/2009   HYPERLIPIDEMIA 04/08/2009   Essential hypertension  04/08/2009   Acute myocardial infarction [facility]) 04/08/2009   Coronary atherosclerosis 04/08/2009   GERD 04/08/2009   FATTY LIVER DISEASE 04/08/2009   History of colonic polyps 04/08/2009   PCP:  [facility]:   [facility] - [place], [place] - [address] [place] [address] Phone: [phone] Fax: [phone]     Social Drivers of Health (SDOH) Social History: SDOH Screenings   Food Insecurity: Food Insecurity Present (05/26/2023)  Housing: Low Risk  (05/27/2023)  Transportation Needs: No Transportation Needs (05/26/2023)  Utilities: Not At Risk (05/26/2023)  Depression (PHQ2-9): Medium Risk (09/22/2021)  Social Connections: Patient Declined (05/27/2023)  Tobacco Use: Medium Risk (06/04/2023)   SDOH Interventions:     Readmission Risk Interventions    05/29/2023    4:13 PM  Readmission Risk Prevention Plan  Transportation Screening Complete  Medication Review (RN Care Manager) Complete  PCP or Specialist appointment within 3-5 days of discharge Complete  HRI or Home Care Consult Complete  SW Recovery Care/Counseling Consult Complete  Palliative Care Screening Complete  Skilled Nursing Facility Not Applicable

## 2023-06-04 NOTE — ED Notes (Signed)
 Paged Dr. Lilyan Remedies regarding hypotension 76/55

## 2023-06-04 NOTE — Progress Notes (Signed)
 Patient c/o 7/10 LLQ abd pain, abd distended and tender.  Pt recoils to touch in area.  Pt describes pain as sharp stabbing.  [facility] paged.  Will continue with plan of care.

## 2023-06-04 NOTE — Sepsis Progress Note (Signed)
 Elink monitoring for the code sepsis protocol.

## 2023-06-04 NOTE — ED Notes (Signed)
 Dr. Lilyan Remedies paged regarding elevated lactic (4.3 per lab)

## 2023-06-04 NOTE — ED Notes (Signed)
 Pt eating breakfast

## 2023-06-05 ENCOUNTER — Inpatient Hospital Stay (HOSPITAL_COMMUNITY)

## 2023-06-05 DIAGNOSIS — J9811 Atelectasis: Secondary | ICD-10-CM

## 2023-06-05 DIAGNOSIS — E8729 Other acidosis: Secondary | ICD-10-CM | POA: Diagnosis not present

## 2023-06-05 DIAGNOSIS — I959 Hypotension, unspecified: Secondary | ICD-10-CM

## 2023-06-05 DIAGNOSIS — S301XXD Contusion of abdominal wall, subsequent encounter: Secondary | ICD-10-CM | POA: Diagnosis not present

## 2023-06-05 DIAGNOSIS — R578 Other shock: Secondary | ICD-10-CM | POA: Diagnosis not present

## 2023-06-05 LAB — LEGIONELLA PNEUMOPHILA SEROGP 1 UR AG: L. pneumophila Serogp 1 Ur Ag: NEGATIVE

## 2023-06-05 LAB — CBC WITH DIFFERENTIAL/PLATELET
Abs Immature Granulocytes: 0.27 10*3/uL — ABNORMAL HIGH (ref 0.00–0.07)
Basophils Absolute: 0 10*3/uL (ref 0.0–0.1)
Basophils Relative: 0 %
Eosinophils Absolute: 0.3 10*3/uL (ref 0.0–0.5)
Eosinophils Relative: 3 %
HCT: 29.9 % — ABNORMAL LOW (ref 36.0–46.0)
Hemoglobin: 9.3 g/dL — ABNORMAL LOW (ref 12.0–15.0)
Immature Granulocytes: 3 %
Lymphocytes Relative: 13 %
Lymphs Abs: 1.3 10*3/uL (ref 0.7–4.0)
MCH: 28.9 pg (ref 26.0–34.0)
MCHC: 31.1 g/dL (ref 30.0–36.0)
MCV: 92.9 fL (ref 80.0–100.0)
Monocytes Absolute: 0.8 10*3/uL (ref 0.1–1.0)
Monocytes Relative: 8 %
Neutro Abs: 7.1 10*3/uL (ref 1.7–7.7)
Neutrophils Relative %: 73 %
Platelets: 187 10*3/uL (ref 150–400)
RBC: 3.22 MIL/uL — ABNORMAL LOW (ref 3.87–5.11)
RDW: 16.6 % — ABNORMAL HIGH (ref 11.5–15.5)
WBC: 9.8 10*3/uL (ref 4.0–10.5)
nRBC: 0.2 % (ref 0.0–0.2)

## 2023-06-05 LAB — BASIC METABOLIC PANEL WITH GFR
Anion gap: 8 (ref 5–15)
BUN: 31 mg/dL — ABNORMAL HIGH (ref 8–23)
CO2: 21 mmol/L — ABNORMAL LOW (ref 22–32)
Calcium: 8.6 mg/dL — ABNORMAL LOW (ref 8.9–10.3)
Chloride: 108 mmol/L (ref 98–111)
Creatinine, Ser: 1.97 mg/dL — ABNORMAL HIGH (ref 0.44–1.00)
GFR, Estimated: 26 mL/min — ABNORMAL LOW (ref >60)
Glucose, Bld: 193 mg/dL — ABNORMAL HIGH (ref 70–99)
Potassium: 5.4 mmol/L — ABNORMAL HIGH (ref 3.5–5.1)
Sodium: 137 mmol/L (ref 135–145)

## 2023-06-05 LAB — ECHOCARDIOGRAM COMPLETE
AR max vel: 2.44 cm2
AV Peak grad: 6.6 mmHg
Ao pk vel: 1.29 m/s
Area-P 1/2: 3.53 cm2
Height: 59 in
P 1/2 time: 422 ms
S' Lateral: 2.9 cm
Weight: 2282.2 [oz_av]

## 2023-06-05 LAB — BPAM RBC
Blood Product Expiration Date: 202506052359
ISSUE DATE / TIME: 202504290353
Unit Type and Rh: 8400

## 2023-06-05 LAB — GLUCOSE, CAPILLARY
Glucose-Capillary: 144 mg/dL — ABNORMAL HIGH (ref 70–99)
Glucose-Capillary: 167 mg/dL — ABNORMAL HIGH (ref 70–99)
Glucose-Capillary: 186 mg/dL — ABNORMAL HIGH (ref 70–99)

## 2023-06-05 LAB — TYPE AND SCREEN
ABO/RH(D): AB POS
Antibody Screen: NEGATIVE
Unit division: 0

## 2023-06-05 LAB — LACTIC ACID, PLASMA: Lactic Acid, Venous: 1.1 mmol/L (ref 0.5–1.9)

## 2023-06-05 LAB — TSH: TSH: 2.827 u[IU]/mL (ref 0.350–4.500)

## 2023-06-05 LAB — TROPONIN I (HIGH SENSITIVITY): Troponin I (High Sensitivity): 6 ng/L (ref <18)

## 2023-06-05 LAB — PROCALCITONIN: Procalcitonin: <0.1 ng/mL

## 2023-06-05 LAB — T4, FREE: Free T4: 1.04 ng/dL (ref 0.61–1.12)

## 2023-06-05 MED ORDER — POLYETHYLENE GLYCOL 3350 17 G PO PACK
17.0000 g | PACK | Freq: Every day | ORAL | Status: DC
  Administered 2023-06-05 – 2023-06-11 (×7): 17 g via ORAL
  Filled 2023-06-05 (×8): qty 1

## 2023-06-05 MED ORDER — IPRATROPIUM-ALBUTEROL 0.5-2.5 (3) MG/3ML IN SOLN
3.0000 mL | Freq: Four times a day (QID) | RESPIRATORY_TRACT | Status: DC
  Administered 2023-06-05: 3 mL via RESPIRATORY_TRACT
  Filled 2023-06-05 (×2): qty 3

## 2023-06-05 MED ORDER — SODIUM ZIRCONIUM CYCLOSILICATE 10 G PO PACK
10.0000 g | PACK | Freq: Every day | ORAL | Status: DC
  Administered 2023-06-05 – 2023-06-08 (×4): 10 g via ORAL
  Filled 2023-06-05 (×4): qty 1

## 2023-06-05 MED ORDER — DOCUSATE SODIUM 100 MG PO CAPS
100.0000 mg | ORAL_CAPSULE | Freq: Two times a day (BID) | ORAL | Status: DC
  Administered 2023-06-05 – 2023-06-09 (×9): 100 mg via ORAL
  Filled 2023-06-05 (×9): qty 1

## 2023-06-05 MED ORDER — MIDODRINE HCL 5 MG PO TABS
2.5000 mg | ORAL_TABLET | Freq: Three times a day (TID) | ORAL | Status: DC
  Administered 2023-06-05 – 2023-06-06 (×2): 2.5 mg via ORAL
  Filled 2023-06-05 (×2): qty 1

## 2023-06-05 MED ORDER — IPRATROPIUM-ALBUTEROL 0.5-2.5 (3) MG/3ML IN SOLN
3.0000 mL | Freq: Three times a day (TID) | RESPIRATORY_TRACT | Status: DC
  Administered 2023-06-06 – 2023-06-07 (×4): 3 mL via RESPIRATORY_TRACT
  Filled 2023-06-05 (×3): qty 3

## 2023-06-05 NOTE — Plan of Care (Signed)

## 2023-06-05 NOTE — Plan of Care (Signed)
  Problem: Coping: Goal: Ability to adjust to condition or change in health will improve Outcome: Progressing   Problem: Fluid Volume: Goal: Ability to maintain a balanced intake and output will improve Outcome: Progressing   Problem: Nutritional: Goal: Maintenance of adequate nutrition will improve Outcome: Progressing   Problem: Skin Integrity: Goal: Risk for impaired skin integrity will decrease Outcome: Progressing   Problem: Tissue Perfusion: Goal: Adequacy of tissue perfusion will improve Outcome: Progressing   Problem: Clinical Measurements: Goal: Will remain free from infection Outcome: Progressing Goal: Diagnostic test results will improve Outcome: Progressing Goal: Respiratory complications will improve Outcome: Progressing Goal: Cardiovascular complication will be avoided Outcome: Progressing   Problem: Activity: Goal: Risk for activity intolerance will decrease Outcome: Progressing

## 2023-06-05 NOTE — TOC Progression Note (Signed)
 Transition of Care [facility]) - Progression Note    Patient Details  Name: [name] MRN: [id number] Date of Birth: 07/26/1950  [facility]) CM/SW Contact  Juliane Och, LCSW Phone Number: 06/05/2023, 2:30 PM  Clinical Narrative:     2:30 PM CSW introduced self and role to patient at bedside. CSW followed up on SDOH needs (food) and offered resources. Patient declined CSW offer. Patient stated she receives monthly food stamps and is aware of local food pantries.  Expected Discharge Plan: Home/Self Care Barriers to Discharge: Continued Medical Work up  Expected Discharge Plan and Services       Living arrangements for the past 2 months: Single Family Home                                       Social Determinants of Health (SDOH) Interventions SDOH Screenings   Food Insecurity: Food Insecurity Present (06/04/2023)  Housing: Low Risk  (06/04/2023)  Transportation Needs: No Transportation Needs (06/04/2023)  Utilities: Not At Risk (06/04/2023)  Depression (PHQ2-9): Medium Risk (09/22/2021)  Social Connections: Moderately Isolated (06/04/2023)  Tobacco Use: Medium Risk (06/04/2023)    Readmission Risk Interventions    05/29/2023    4:13 PM  Readmission Risk Prevention Plan  Transportation Screening Complete  Medication Review (RN Care Manager) Complete  PCP or Specialist appointment within 3-5 days of discharge Complete  HRI or Home Care Consult Complete  SW Recovery Care/Counseling Consult Complete  Palliative Care Screening Complete  Skilled Nursing Facility Not Applicable

## 2023-06-05 NOTE — Progress Notes (Addendum)
 PROGRESS NOTE    [name]  QIH:[id number] DOB: February 18, 1950 DOA: 06/03/2023 PCP: [facility]   Brief Narrative:  [name] is a 73 y.o. female with medical history significant of history of breast cancer, MGUS, COPD, insulin -dependent DM type II, essential hypertension, anxiety, gout, hyperlipidemia, gerd, CKD stage IV, gout, and generalized weakness presented to the hospital with complaining of left-sided chest pain and generalized abdominal pain.   Patient was admitted from 4/18 to 4/27 acute hypoxic respiratory failure and sepsis  in the setting of community-acquired pneumonia and COPD exacerbation.  Patient required Levophed  drip in the setting of hypotension in the context of sepsis, required BiPAP completed course of IV ceftriaxone  azithromycin  for 7 days.  Blood culture from 4/13 no growth so far.  Patient was discharged to home with home PT OT management.   During admitting hospitalist evaluation, she denied any chest pain but complained of abdominal pain.  Reportedly, she has chronic cough.  It was verified that patient is full code.  He was agreeable with blood transfusion if needed.   At presentation to ED patient was hypotensive blood pressure dropped to 84/55.  Required nonrebreather and then weaned to 15 L O2 sat 100%. Normal troponin level 13.  EKG showing normal sinus rhythm heart rate 85.  Was found to have hyperkalemia 5.9 and creatinine 2.42 which is at her baseline. CBC showing persistent leukocytosis 14.9, stable H&H 9.2 and 29.  Chest x-ray shallow aspiration with consolidation with consolidation/atelectasis in the left lung base.   CT abdomen pelvis: IMPRESSION: 1. Extensive intramuscular hematoma within the left anterolateral abdominal wall, measuring up to 3.7 cm within the rectus sheath. 2. Small bilateral fat containing inguinal hernias. 3.  Aortic Atherosclerosis (ICD10-I70.0).   In the ED patient has been treated with 500 mL of LR  and 500 mL of NS bolus, Lokelma  and received fentanyl .  Admitted under hospitalist service.  Assessment & Plan:   Principal Problem:   Abdominal wall hematoma Active Problems:   Insulin  dependent type 2 diabetes mellitus (HCC)   COPD (chronic obstructive pulmonary disease) (HCC)   Left lower lobe pneumonia   Essential hypertension   MGUS (monoclonal gammopathy of unknown significance)   CKD (chronic kidney disease) stage 4, GFR 15-29 ml/min (HCC)   Generalized anxiety disorder   History of gout   Generalized weakness   History of breast cancer   Gout   Hematoma  Abdominal wall hematoma No reported history of trauma. CT abdomen pelvis showed Extensive intramuscular hematoma within the left anterolateral abdominal wall, measuring up to 3.7 cm within the rectus sheath. . Small bilateral fat containing inguinal hernias.  Due to hypotension and hematoma, admitting hospitalist ordered 1 unit of PRBC transfusion and abdominal binder.  Avoiding pharmacological DVT prophylaxis.  Hemoglobin stable.   Sepsis secondary to left lower lobe pneumonia has been ruled out/chronic hypoxic respiratory failure dependent on 2 L of oxygen : -Patient has history of end-stage COPD and recent hospitalization for COPD exacerbation, at the verge of requiring intubation however patient declined and she was agreeable to treat with BiPAP.  She uses 2 L of oxygen  chronically.  As of now patient is still full code.  She was treated with ceftriaxone  azithromycin .  Blood culture was negative.  However there is no sputum culture is drawn.  Although chest x-ray shows possibility of consolidation however in the setting of no fever and improving leukocytosis, I am not fully convinced that this is persistent pneumonia.  Patient  has been started on very broad-spectrum antibiotics/vancomycin  and cefepime.  Respiratory viral panel is negative as well.  Blood cultures have been drawn which are negative so far.  Sputum culture is also  ordered.  With resolution of leukocytosis, I remain under impressed but due to repeated low blood pressure, we will continue antibiotics for now.  Hypotension: Patient has been having persistent hypotension however upon chart review, it appears that patient did have intermittent hypotension during recent hospitalization as well given after she was weaned off of vasopressors.  Patient has remained asymptomatic.  Patient received 1 L fluid bolus in the ED.  Initially lactic acid was normal as well however that eventually jumped to 4.3.  She received another fluid bolus of 1 L and then lactic acid improved to 1.9.  She was hypotensive briefly overnight and received another 500 cc fluid bolus.  This is despite of her continuing to receive "Ringer's"  lactate at 125 cc/h.  Blood pressure is now much better.  Recheck lactic acid this morning which is once again is normal and blood pressure is slightly better and stable.  Will closely monitor. Addendum: Due to persistent hypotension, with recent blood pressure of 93/56.  I have consulted critical care for their assistance as well.   Hyperkalemia Potassium 5.9 upon arrival, she received Lokelma , hyperkalemia improved but has hyperkalemia again today at 5.4.  Will start on Lokelma  daily now.  Repeat potassium tomorrow morning.   Left-sided chest pain - Normal troponin.  EKG unremarkable. - Left-sided chest wall pain is noncardiac origin in the setting of referred pain from left-sided abdominal wall hematoma.   COPD without acute exacerbation - Initially patient required nonrebreather due to shortness of breath currently maintaining O2 sat over 95% on 2 L oxygen .  Physical exam no evidence of wheezing shortness of breath.  Patient is still complaining about cough. - Continue DuoNeb as needed and Dulera  twice daily.   Essential hypertension -Holding home blood pressure regimen in the setting of hypotension.   CKD stage IV -CMP showed creatinine 2.42 and GFR 20  upon arrival which is at her baseline.  Creatinine actually has better than baseline at 1.97 today.   Generalized anxiety disorder -Continue Atarax  as needed.  Resumed Cymbalta .   Gout -Continue allopurinol    Hyperlipidemia - Continue Lipitor   History of breast cancer History of MGUS -Waiting for pharmacy verification if patient has been taking anastrozole .   Insulin -dependent DM type II - A1c 8.5 checked in 05/2023.  Currently on SSI.  Slightly hypoglycemic this morning.  Will continue current management.   Generalized weakness - Need to resume home health on discharge to continue the home PT and OT.  DVT prophylaxis: Place and maintain sequential compression device Start: 06/05/23 0736 SCDs Start: 06/04/23 0043 Place TED hose Start: 06/04/23 0043   Code Status: Full Code  Family Communication:  None present at bedside.  Plan of care discussed with patient in length and he/she verbalized understanding and agreed with it.  Status is: Inpatient Remains inpatient appropriate because: Patient hypotensive with hematoma.  Needs further monitoring.   Estimated body mass index is 28.81 kg/m as calculated from the following:   Height as of this encounter: 4\' 11"  (1.499 m).   Weight as of this encounter: 64.7 kg.    Nutritional Assessment: Body mass index is 28.81 kg/m.Aaron Aas Seen by dietician.  I agree with the assessment and plan as outlined below: Nutrition Status:        . Skin Assessment: I  have examined the patient's skin and I agree with the wound assessment as performed by the wound care RN as outlined below:    Consultants:  None  Procedures:  None  Antimicrobials:  Anti-infectives (From admission, onward)    Start     Dose/Rate Route Frequency Ordered Stop   06/06/23 0500  vancomycin  (VANCOREADY) IVPB 750 mg/150 mL        750 mg 150 mL/hr over 60 Minutes Intravenous Every 48 hours 06/04/23 0313     06/04/23 0100  ceFEPIme (MAXIPIME) 2 g in sodium chloride   0.9 % 100 mL IVPB        2 g 200 mL/hr over 30 Minutes Intravenous Every 24 hours 06/04/23 0057     06/04/23 0100  vancomycin  (VANCOREADY) IVPB 1500 mg/300 mL        1,500 mg 150 mL/hr over 120 Minutes Intravenous  Once 06/04/23 0058 06/04/23 0346         Subjective: Patient seen and examined.  Complains of generalized abdominal pain.  Is tender in whole abdomen as well.  She otherwise has no other complaint.  Denies any chest pain.  She remains fully alert and oriented.  Objective: Vitals:   06/05/23 0000 06/05/23 0020 06/05/23 0414 06/05/23 0700  BP: (!) 89/61 92/71 97/63  107/70  Pulse: 72 76 77 74  Resp: 15 15 14 18   Temp:   98 F (36.7 C) 98 F (36.7 C)  TempSrc:   Oral Oral  SpO2: 99% 97% 99% 98%  Weight:      Height:        Intake/Output Summary (Last 24 hours) at 06/05/2023 0735 Last data filed at 06/05/2023 0112 Gross per 24 hour  Intake 4577.69 ml  Output 2000 ml  Net 2577.69 ml   Filed Weights   06/03/23 1936 06/04/23 1803  Weight: 64 kg 64.7 kg    Examination:  General exam: Appears in pain. Respiratory system: Clear to auscultation. Respiratory effort normal. Cardiovascular system: S1 & S2 heard, RRR. No JVD, murmurs, rubs, gallops or clicks. No pedal edema. Gastrointestinal system: Abdomen is nondistended, soft and generalized abdominal tenderness. No organomegaly or masses felt. Normal bowel sounds heard. Central nervous system: Alert and oriented. No focal neurological deficits. Extremities: Symmetric 5 x 5 power. Skin: No rashes, lesions or ulcers.  Psychiatry: Judgement and insight appear normal. Mood & affect appropriate.   Data Reviewed: I have personally reviewed following labs and imaging studies  CBC: Recent Labs  Lab 06/01/23 0750 06/03/23 1939 06/04/23 0110 06/04/23 1155 06/04/23 2226 06/05/23 0242  WBC 15.0* 14.9* 13.1* 10.8*  --  9.8  NEUTROABS  --   --   --  7.3  --  7.1  HGB 9.0* 9.2* 8.6* 9.5* 9.0* 9.3*  HCT 28.2* 29.6*  29.1* 31.1* 29.2* 29.9*  MCV 91.3 94.0 96.4 93.7  --  92.9  PLT 217 232 209 183  --  187   Basic Metabolic Panel: Recent Labs  Lab 06/01/23 0750 06/03/23 1939 06/04/23 0110 06/04/23 0958 06/05/23 0242  NA 135 135 134* 135 137  K 5.0 5.9* 5.8* 4.9 5.4*  CL 100 98 102 102 108  CO2 24 24 21* 23 21*  GLUCOSE 217* 178* 151* 173* 193*  BUN 60* 49* 47* 42* 31*  CREATININE 2.52* 2.42* 2.25* 2.15* 1.97*  CALCIUM  9.1 9.3 8.9 9.0 8.6*  MG  --   --   --  1.8  --    GFR: Estimated Creatinine Clearance: 20.2 mL/min (A) (  by C-G formula based on SCr of 1.97 mg/dL (H)). Liver Function Tests: Recent Labs  Lab 06/01/23 0750 06/03/23 1939 06/04/23 0110 06/04/23 0958  AST 15 26 20 21   ALT 15 27 24 24   ALKPHOS 75 84 78 68  BILITOT 0.7 0.9 1.0 1.0  PROT 6.0* 6.9 6.1* 5.9*  ALBUMIN  2.9* 3.2* 2.9* 2.7*   No results for input(s): "LIPASE", "AMYLASE" in the last 168 hours. No results for input(s): "AMMONIA" in the last 168 hours. Coagulation Profile: No results for input(s): "INR", "PROTIME" in the last 168 hours. Cardiac Enzymes: No results for input(s): "CKTOTAL", "CKMB", "CKMBINDEX", "TROPONINI" in the last 168 hours. BNP (last 3 results) No results for input(s): "PROBNP" in the last 8760 hours. HbA1C: No results for input(s): "HGBA1C" in the last 72 hours. CBG: Recent Labs  Lab 06/04/23 0205 06/04/23 0758 06/04/23 1145 06/04/23 1608 06/04/23 2059  GLUCAP 140* 82 173* 152* 220*   Lipid Profile: No results for input(s): "CHOL", "HDL", "LDLCALC", "TRIG", "CHOLHDL", "LDLDIRECT" in the last 72 hours. Thyroid  Function Tests: No results for input(s): "TSH", "T4TOTAL", "FREET4", "T3FREE", "THYROIDAB" in the last 72 hours. Anemia Panel: No results for input(s): "VITAMINB12", "FOLATE", "FERRITIN", "TIBC", "IRON ", "RETICCTPCT" in the last 72 hours. Sepsis Labs: Recent Labs  Lab 06/04/23 0121 06/04/23 1100 06/04/23 1555  LATICACIDVEN 1.4 4.3* 1.9    Recent Results (from the past  240 hours)  Respiratory (~20 pathogens) panel by PCR     Status: None   Collection Time: 06/04/23  1:01 AM   Specimen: Nasopharyngeal Swab; Respiratory  Result Value Ref Range Status   Adenovirus NOT DETECTED NOT DETECTED Final   Coronavirus 229E NOT DETECTED NOT DETECTED Final    Comment: (NOTE) The Coronavirus on the Respiratory Panel, DOES NOT test for the novel  Coronavirus (2019 nCoV)    Coronavirus HKU1 NOT DETECTED NOT DETECTED Final   Coronavirus NL63 NOT DETECTED NOT DETECTED Final   Coronavirus OC43 NOT DETECTED NOT DETECTED Final   Metapneumovirus NOT DETECTED NOT DETECTED Final   Rhinovirus / Enterovirus NOT DETECTED NOT DETECTED Final   Influenza A NOT DETECTED NOT DETECTED Final   Influenza B NOT DETECTED NOT DETECTED Final   Parainfluenza Virus 1 NOT DETECTED NOT DETECTED Final   Parainfluenza Virus 2 NOT DETECTED NOT DETECTED Final   Parainfluenza Virus 3 NOT DETECTED NOT DETECTED Final   Parainfluenza Virus 4 NOT DETECTED NOT DETECTED Final   Respiratory Syncytial Virus NOT DETECTED NOT DETECTED Final   Bordetella pertussis NOT DETECTED NOT DETECTED Final   Bordetella Parapertussis NOT DETECTED NOT DETECTED Final   Chlamydophila pneumoniae NOT DETECTED NOT DETECTED Final   Mycoplasma pneumoniae NOT DETECTED NOT DETECTED Final    Comment: Performed at [facility], [address] [place], [place] [address]  Culture, blood (routine x 2) Call MD if unable to obtain prior to antibiotics being given     Status: None (Preliminary result)   Collection Time: 06/04/23  1:10 AM   Specimen: BLOOD LEFT FOREARM  Result Value Ref Range Status   Specimen Description BLOOD LEFT FOREARM  Final   Special Requests   Final    BOTTLES DRAWN AEROBIC ONLY Blood Culture results may not be optimal due to an inadequate volume of blood received in culture bottles   Culture   Final    NO GROWTH 1 DAY Performed at [facility], [address] [place], [place] [address]     Report Status PENDING  Incomplete  Culture, blood (routine x 2) Call MD if unable to obtain prior to antibiotics being given     Status: None (Preliminary result)   Collection Time: 06/04/23 11:00 AM   Specimen: BLOOD  Result Value Ref Range Status   Specimen Description BLOOD LEFT ANTECUBITAL  Final   Special Requests   Final    BOTTLES DRAWN AEROBIC AND ANAEROBIC Blood Culture results may not be optimal due to an inadequate volume of blood received in culture bottles   Culture   Final    NO GROWTH < 24 HOURS Performed at [facility], [address], [place], [place] [address]    Report Status PENDING  Incomplete     Radiology Studies: CT ABDOMEN PELVIS WO CONTRAST Result Date: 06/03/2023 CLINICAL DATA:  Chest pain, arm pain, abdominal pain EXAM: CT ABDOMEN AND PELVIS WITHOUT CONTRAST TECHNIQUE: Multidetector CT imaging of the abdomen and pelvis was performed following the standard protocol without IV contrast. Unenhanced CT was performed per clinician order. Lack of IV contrast limits sensitivity and specificity, especially for evaluation of abdominal/pelvic solid viscera. RADIATION DOSE REDUCTION: This exam was performed according to the departmental dose-optimization program which includes automated exposure control, adjustment of the mA and/or kV according to patient size and/or use of iterative reconstruction technique. COMPARISON:  06/03/2023, 06/01/2023, 11/02/2022 FINDINGS: Lower chest: Mild dependent lower lobe atelectasis. No acute airspace disease. Cardiomegaly without pericardial effusion. Hepatobiliary: Cholecystectomy. Unremarkable unenhanced appearance of the liver. Pancreas: Unremarkable unenhanced appearance. Spleen: Unremarkable unenhanced appearance. Adrenals/Urinary Tract: No urinary tract calculi or obstructive uropathy within either kidney. The adrenals and bladder are unremarkable. Stomach/Bowel: No bowel obstruction or ileus. Postsurgical changes from prior bowel  resection and ileocolic anastomosis right lower quadrant. No bowel wall thickening or inflammatory change. Mild retained stool within the proximal colon. Vascular/Lymphatic: Aortic atherosclerosis. No enlarged abdominal or pelvic lymph nodes. Reproductive: Uterus and bilateral adnexa are unremarkable. Other: No free fluid or free intraperitoneal gas. Small bilateral fat containing inguinal hernias. Musculoskeletal: Intramuscular hematoma within the left anterolateral abdomen, measuring up to 3.7 cm within the rectus sheath. The intramuscular hematoma extends along the transversus abdominal muscles as well, and extends approximately 20 cm in craniocaudal extent. Mild subcutaneous fat stranding within the left lateral abdomen. No acute or destructive bony abnormalities. Reconstructed images demonstrate no additional findings. IMPRESSION: 1. Extensive intramuscular hematoma within the left anterolateral abdominal wall, measuring up to 3.7 cm within the rectus sheath. 2. Small bilateral fat containing inguinal hernias. 3.  Aortic Atherosclerosis (ICD10-I70.0). Critical Value/emergent results were called by telephone at the time of interpretation on 06/03/2023 at 10:05 pm to provider MELANIE BELFI , who verbally acknowledged these results. Electronically Signed   By: Bobbye Burrow M.D.   On: 06/03/2023 22:06   DG Chest Port 1 View Result Date: 06/03/2023 CLINICAL DATA:  Chest pain. Left arm pain. Decreased oxygen  saturation. EXAM: PORTABLE CHEST 1 VIEW COMPARISON:  05/31/2023 FINDINGS: Shallow inspiration. Heart size and pulmonary vascularity are normal for technique. Consolidation or atelectasis in the left base. No pleural effusion or pneumothorax is identified. Calcification of the aorta. Mediastinal contours appear intact. Degenerative changes in the right shoulder and spine. Postoperative changes in the left shoulder. IMPRESSION: Shallow inspiration with consolidation or atelectasis in the left base.  Electronically Signed   By: Boyce Byes M.D.   On: 06/03/2023 20:24    Scheduled Meds:  allopurinol   100 mg Oral Daily   atorvastatin   20 mg Oral Daily   DULoxetine   30 mg  Oral Daily   ferrous sulfate   325 mg Oral BID WC   fluticasone   2 spray Each Nare Daily   hydrOXYzine   50 mg Oral QHS   insulin  aspart  0-5 Units Subcutaneous QHS   insulin  aspart  0-6 Units Subcutaneous TID WC   mometasone -formoterol   2 puff Inhalation BID   pantoprazole   40 mg Oral Daily   sodium chloride  flush  3 mL Intravenous Q12H   sodium chloride  flush  3 mL Intravenous Q12H   sodium zirconium cyclosilicate   10 g Oral Daily   Continuous Infusions:  ceFEPime (MAXIPIME) IV 2 g (06/05/23 0121)   lactated ringers  125 mL/hr at 06/05/23 0411   [START ON 06/06/2023] vancomycin        LOS: 1 day   Modena Andes, MD [facility]  06/05/2023, 7:35 AM  Total time spent: 50 minutes  *Please note that this is a verbal dictation therefore any spelling or grammatical errors are due to the "[employer] One" system interpretation.  Please page via Amion and do not message via secure chat for urgent patient care matters. Secure chat can be used for non urgent patient care matters.  How to contact the [facility] Attending or Consulting provider 7A - 7P or covering provider during after hours 7P -7A, for this patient?  Check the care team in [facility] and look for a) attending/consulting [facility] provider listed and b) the [facility] team listed. Page or secure chat 7A-7P. Log into [email or web address] and use 's universal password to access. If you do not have the password, please contact the hospital operator. Locate the [facility] provider you are looking for under [facility] and page to a number that you can be directly reached. If you still have difficulty reaching the provider, please page the [facility] (Director on Call) for the Hospitalists listed on amion for assistance.

## 2023-06-05 NOTE — Progress Notes (Signed)
 Echocardiogram 2D Echocardiogram has been performed.  [name] 06/05/2023, 3:32 PM

## 2023-06-05 NOTE — Consult Note (Addendum)
 NAME:  [name], MRN:  [id number], DOB:  1950/10/22, LOS: 1 ADMISSION DATE:  06/03/2023, CONSULTATION DATE:  06/05/2023 REFERRING MD: Modena Andes, MD, CHIEF COMPLAINT: hypotension    History of Present Illness:  A 73 yr old female patient with COPD on Dulera  and HOT @ 2 L [place], poorly controlled DM-2 (HbA1c 8.2%), HTN, anxiety, GERD, CKD-4 (Cr 2.2 at baseline), anemia of chronic illness, breast cancer, and MGUS, presented to the hospital with complaining of left-sided chest pain and left sided abdominal pain, mainly laterally, worse with taking deep breath, cough, touch and movement like pending or twisting. She has dry cough, without SOB, DOE, wheezing, LL edema, N/V/D, f/c/r, or rash.  She was admitted from 4/18-4/27 [facility], acute hypoxic respiratory failure, PNA, and sepsis Rx for 7 days of Rocephin  and Zithromax . Discharged on home O2 Rx, @ 2 L [place]. Compliant with meds. Quit smoking few yrs ago, smoked 0.3 ppd for 50 yrs.  At presentation to ED patient was hypotensive and given 2.5 L LR. She was on NRM, but now 1.5 L [place] and her SpO2 97%. Her BP is borderline low but she denied dizziness or palpitations.   Pertinent  Medical History  COPD on Dulera  and HOT @ 2 L [place], poorly controlled DM-2 (HbA1c 8.2%), HTN, anxiety, GERD, CKD-4 (Cr 2.2 at baseline), anemia of chronic illness, breast cancer, MGUS, dyslipidemia  Significant Hospital Events: Including procedures, antibiotic start and stop dates in addition to other pertinent events   4/29: admission to the floor 4/30: PCCM was consulted for hypotension   Interim History / Subjective:    Objective   Blood pressure (!) 93/56, pulse 70, temperature 98 F (36.7 C), temperature source Oral, resp. rate 16, height 4\' 11"  (1.499 m), weight 64.7 kg, SpO2 97%.        Intake/Output Summary (Last 24 hours) at 06/05/2023 1254 Last data filed at 06/05/2023 0112 Gross per 24 hour  Intake 4577.69 ml  Output 2000 ml  Net 2577.69 ml   Filed Weights    06/03/23 1936 06/04/23 1803  Weight: 64 kg 64.7 kg    Examination: General: alert, oriented x4, and comfortable. On 1.5 L [place]. SpO2 97%  HENT: PERL, normal pharynx and oral mucosa. No LNE or thyromegaly. No JVD Lungs: symmetrical air entry bilaterally. No crackles or wheezing Cardiovascular: NL S1/S2. No m/g/r Abdomen: no distension, but abd wall tenderness left>right laterally Extremities: no edema. Symmetrical  Neuro: nonfocal   Resolved Hospital Problem list   Lactic acidosis   Assessment & Plan:  Abd wall hematoma leading to hypotension and lactic acidosis. Lactic acidosis resolved with IVF bolus. Hypotension is better with IVF. Pain meds can drop her BP. Sep 2024, Echo EF 65%, G1DD  -PCT, Trop, TFT, cortisol  -Repeat Echo -Laxatives -Consider Midodrine PRN for MAP<65 mmHg  Mild LLL atelectasis, doubt PNA -If PCT is low, d/c Abx -I/S and FV -Duoneb -SCD  COPD of Dulera  and HOT @ 2 L [name] -As above -Outpatient PFT  Poorly controlled DM-2 (HbA1c 8.2%) HTN Anxiety GERD CKD-4 (Cr 2.2 at baseline) Anemia of chronic illness: mild drop HyperK Other Mx: per primary team  Labs   CBC: Recent Labs  Lab 06/01/23 0750 06/03/23 1939 06/04/23 0110 06/04/23 1155 06/04/23 2226 06/05/23 0242  WBC 15.0* 14.9* 13.1* 10.8*  --  9.8  NEUTROABS  --   --   --  7.3  --  7.1  HGB 9.0* 9.2* 8.6* 9.5* 9.0* 9.3*  HCT 28.2* 29.6*  29.1* 31.1* 29.2* 29.9*  MCV 91.3 94.0 96.4 93.7  --  92.9  PLT 217 232 209 183  --  187    Basic Metabolic Panel: Recent Labs  Lab 06/01/23 0750 06/03/23 1939 06/04/23 0110 06/04/23 0958 06/05/23 0242  NA 135 135 134* 135 137  K 5.0 5.9* 5.8* 4.9 5.4*  CL 100 98 102 102 108  CO2 24 24 21* 23 21*  GLUCOSE 217* 178* 151* 173* 193*  BUN 60* 49* 47* 42* 31*  CREATININE 2.52* 2.42* 2.25* 2.15* 1.97*  CALCIUM  9.1 9.3 8.9 9.0 8.6*  MG  --   --   --  1.8  --    GFR: Estimated Creatinine Clearance: 20.2 mL/min (A) (by C-G formula based on SCr of  1.97 mg/dL (H)). Recent Labs  Lab 06/03/23 1939 06/04/23 0110 06/04/23 0121 06/04/23 1100 06/04/23 1155 06/04/23 1555 06/05/23 0242 06/05/23 0754  WBC 14.9* 13.1*  --   --  10.8*  --  9.8  --   LATICACIDVEN  --   --  1.4 4.3*  --  1.9  --  1.1    Liver Function Tests: Recent Labs  Lab 06/01/23 0750 06/03/23 1939 06/04/23 0110 06/04/23 0958  AST 15 26 20 21   ALT 15 27 24 24   ALKPHOS 75 84 78 68  BILITOT 0.7 0.9 1.0 1.0  PROT 6.0* 6.9 6.1* 5.9*  ALBUMIN  2.9* 3.2* 2.9* 2.7*   No results for input(s): "LIPASE", "AMYLASE" in the last 168 hours. No results for input(s): "AMMONIA" in the last 168 hours.  ABG    Component Value Date/Time   PHART 7.222 (L) 05/24/2023 0151   PCO2ART 43.4 05/24/2023 0151   PO2ART 58 (L) 05/24/2023 0151   HCO3 17.8 (L) 05/24/2023 0151   TCO2 19 (L) 05/24/2023 0151   ACIDBASEDEF 9.0 (H) 05/24/2023 0151   O2SAT 84 05/24/2023 0151     Coagulation Profile: No results for input(s): "INR", "PROTIME" in the last 168 hours.  Cardiac Enzymes: No results for input(s): "CKTOTAL", "CKMB", "CKMBINDEX", "TROPONINI" in the last 168 hours.  HbA1C: Hgb A1c MFr Bld  Date/Time Value Ref Range Status  05/24/2023 06:05 AM 8.2 (H) 4.8 - 5.6 % Final    Comment:    (NOTE) Pre diabetes:          5.7%-6.4%  Diabetes:              >6.4%  Glycemic control for   <7.0% adults with diabetes   02/08/2021 11:26 AM 5.9 (H) 4.8 - 5.6 % Final    Comment:    (NOTE) Pre diabetes:          5.7%-6.4%  Diabetes:              >6.4%  Glycemic control for   <7.0% adults with diabetes     CBG: Recent Labs  Lab 06/04/23 1145 06/04/23 1608 06/04/23 2059 06/05/23 0802 06/05/23 1105  GLUCAP 173* 152* 220* 144* 167*    Review of Systems:   Review of Systems  Constitutional:  Negative for chills and fever.  HENT:  Positive for nosebleeds. Negative for congestion, sinus pain and sore throat.   Respiratory:  Positive for cough. Negative for hemoptysis,  sputum production, shortness of breath, wheezing and stridor.   Cardiovascular:  Positive for chest pain. Negative for palpitations, orthopnea, leg swelling and PND.  Gastrointestinal:  Negative for diarrhea, nausea and vomiting.  Genitourinary:  Negative for dysuria.  Skin:  Negative for itching and rash.  Past Medical History:  She,  has a past medical history of Ambulates with cane, Angina, Arthritis, Asthma, Breast cancer (HCC), Cancer (HCC) (09/04/2017), Carpal tunnel syndrome, bilateral (11/26/2016), Cholelithiasis, Cocaine abuse (HCC) (07/2012), COPD (chronic obstructive pulmonary disease) (HCC), Coronary artery disease, Diabetes mellitus without mention of complication, Dysphagia, Fatty liver disease, nonalcoholic (2009), GERD (gastroesophageal reflux disease), Headache, Hearing loss, History of colonic polyps (2009), Hyperlipidemia, Hypertension, Iron  deficiency anemia, Myocardial infarction (HCC), Nausea with vomiting, Ulnar neuropathy at elbow, 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   Surgical History:   Past Surgical History:  Procedure Laterality Date   ABDOMINAL ANGIOGRAM  02/20/2011   Procedure: ABDOMINAL ANGIOGRAM;  Surgeon: Sharene Dauer, MD;  Location: [facility] CATH LAB;  Service: Cardiovascular;;   BREAST BIOPSY Left    CARPAL TUNNEL RELEASE Right 07/04/2017   Procedure: RIGHT CARPAL TUNNEL RELEASE;  Surgeon: Brunilda Capra, MD;  Location: [facility];  Service: Orthopedics;  Laterality: Right;   CARPAL TUNNEL RELEASE Left 09/12/2017   Procedure: LEFT CARPAL TUNNEL RELEASE;  Surgeon: Brunilda Capra, MD;  Location: [facility];  Service: Orthopedics;  Laterality: Left;   CATARACT EXTRACTION     CHOLECYSTECTOMY N/A 11/11/2018   Procedure: ATTEMPTED LAPAROSCOPIC CHOLECUSTECOMY,  OPEN CHOLECYSTECTOMY;  Surgeon: Oza Blumenthal, MD;  Location: [facility] OR;  Service: General;  Laterality: N/A;   COLONOSCOPY  11/30/2011   Procedure: COLONOSCOPY;   Surgeon: Pietro Bridegroom, MD;  Location: [facility] END[facility];  Service: Endoscopy;  Laterality: N/A;   CORONARY ANGIOPLASTY WITH STENT PLACEMENT  02/20/2011   ERCP N/A 10/03/2018   Procedure: ENDOSCOPIC RETROGRADE CHOLANGIOPANCREATOGRAPHY (ERCP);  Surgeon: Asencion Blacksmith, MD;  Location: [facility];  Service: Endoscopy;  Laterality: N/A;   ESOPHAGOGASTRODUODENOSCOPY  11/30/2011   Procedure: ESOPHAGOGASTRODUODENOSCOPY (EGD);  Surgeon: Pietro Bridegroom, MD;  Location: [facility];  Service: Endoscopy;  Laterality: N/A;   ESOPHAGOGASTRODUODENOSCOPY N/A 02/23/2015   Procedure: ESOPHAGOGASTRODUODENOSCOPY (EGD);  Surgeon: Kenney Peacemaker, MD;  [facility] END[facility];  Service: Endoscopy;  Laterality: N/A;   EYE SURGERY Bilateral    cataracts removed   INTRAOPERATIVE CHOLANGIOGRAM N/A 11/11/2018   Procedure: Intraoperative Cholangiogram;  Surgeon: Oza Blumenthal, MD;  Location: [facility];  Service: General;  Laterality: N/A;   IR REMOVAL TUN ACCESS W/ PORT W/O FL MOD SED  06/30/2020   LEFT HEART CATHETERIZATION WITH CORONARY ANGIOGRAM N/A 02/20/2011   Procedure: LEFT HEART CATHETERIZATION WITH CORONARY ANGIOGRAM;  Surgeon: Sharene Dauer, MD;  Location: [facility] CATH [facility];  Service: Cardiovascular;  Laterality: N/A;   MASTECTOMY Right 11/05/2017   MASTECTOMY W/ SENTINEL NODE BIOPSY Right 11/27/2017   MASTECTOMY W/ SENTINEL NODE BIOPSY Right 11/27/2017   Procedure: RIGHT TOTAL MASTECTOMY WITH SENTINEL LYMPH NODE BIOPSY;  Surgeon: Ayesha Lente, MD;  Location: [facility];  Service: General;  Laterality: Right;   PORT A CATH REVISION Left 05/07/2018   Procedure: PORT A CATH REVISION;  Surgeon: Ayesha Lente, MD;  Location: [facility]S;  Service: General;  Laterality: Left;   PORTACATH PLACEMENT Left 11/27/2017   Procedure: INSERTION PORT-A-CATH;  Surgeon: Ayesha Lente, MD;  Location: [facility];  Service: General;  Laterality: Left;   REVERSE SHOULDER ARTHROPLASTY Left 03/09/2021   Procedure: REVERSE SHOULDER  ARTHROPLASTY;  Surgeon: Ellard Gunning, MD;  Location: [facility];  Service: Orthopedics;  Laterality: Left;   RIGHT COLECTOMY  02/2007   for post polypectomy colonic perforation   SPHINCTEROTOMY  10/03/2018   Procedure: SPHINCTEROTOMY;  Surgeon: Asencion Blacksmith, MD;  Location: [facility];  Service: Endoscopy;;  TUBAL LIGATION       Social History:   reports that she quit smoking about 13 years ago. Her smoking use included cigarettes. She started smoking about 60 years ago. She has a 11.8 pack-year smoking history. She has never used smokeless tobacco. She reports that she does not currently use alcohol. She reports that she does not currently use drugs after having used the following drugs: Cocaine.   Family History:  Her family history includes Cancer in her father; Diabetes in her mother and sister; Heart disease in her mother. There is no history of Colon cancer or Breast cancer.   Allergies No Known Allergies   Home Medications  Prior to Admission medications   Medication Sig Start Date End Date Taking? Authorizing Provider  acetaminophen  (TYLENOL ) 650 MG CR tablet Take 650 mg by mouth every 8 (eight) hours as needed for pain.   Yes [provider]  albuterol  (PROVENTIL ) (2.5 MG/3ML) 0.083% nebulizer solution Take 3 mLs (2.5 mg total) by nebulization every 6 (six) hours as needed for wheezing or shortness of breath. 08/02/21  Yes Burnette, Leory Rands, PA-C  albuterol  (VENTOLIN  HFA) 108 (90 Base) MCG/ACT inhaler Inhale 1-2 puffs into the lungs every 6 (six) hours as needed for wheezing or shortness of breath. 09/14/21  Yes Hassie Lint, PA-C  allopurinol  (ZYLOPRIM ) 100 MG tablet Take 100 mg by mouth daily.   Yes [provider]  anastrozole  (ARIMIDEX ) 1 MG tablet TAKE ONE TABLET BY MOUTH ONCE DAILY 06/18/22  Yes Cameron Cea, MD  aspirin  81 MG EC tablet Take 1 tablet (81 mg total) by mouth daily. Patient taking differently: Take 81 mg by mouth in the morning and at  bedtime. 10/13/18  Yes Pokhrel, Laxman, MD  atorvastatin  (LIPITOR) 20 MG tablet Take 20 mg by mouth daily.   Yes [provider]  diclofenac  Sodium (VOLTAREN ) 1 % GEL Apply 1 Application topically 4 (four) times daily. Patient taking differently: Apply 1 Application topically in the morning and at bedtime. 09/13/21  Yes Gudena, Vinay, MD  DULoxetine  (CYMBALTA ) 30 MG capsule Take 30 mg by mouth daily.   Yes [provider]  feeding supplement, GLUCERNA SHAKE, (GLUCERNA SHAKE) LIQD Take 237 mLs by mouth 2 (two) times daily between meals. 06/02/23  Yes Doroteo Gasmen, MD  ferrous sulfate  325 (65 FE) MG tablet Take 1 tablet (325 mg total) by mouth 2 (two) times daily with a meal. 06/02/23  Yes Doroteo Gasmen, MD  fluticasone  (FLONASE ) 50 MCG/ACT nasal spray Place 2 sprays into both nostrils daily. 05/10/21  Yes Newlin, Enobong, MD  gabapentin  (NEURONTIN ) 100 MG capsule Take 100-200 mg by mouth See admin instructions. Take one tablet by mouth in the morning and afternoon, then take 2 tablets every night per patient 03/12/22  Yes [provider]  glipiZIDE  2.5 MG TABS Take 2.5 mg by mouth daily before breakfast. 06/02/23  Yes Doroteo Gasmen, MD  hydrOXYzine  (ATARAX ) 25 MG tablet Take 50 mg by mouth at bedtime. 05/07/23  Yes [provider]  methocarbamol  (ROBAXIN ) 500 MG tablet Take 1 tablet (500 mg total) by mouth 2 (two) times daily. 03/09/23  Yes Prosperi, Christian H, PA-C  pantoprazole  (PROTONIX ) 40 MG tablet TAKE ONE TABLET BY MOUTH EVERY DAY FOR ACID REFLUX 11/02/21  Yes Fleming, Zelda W, NP  polyethylene glycol (MIRALAX  / GLYCOLAX ) 17 g packet Take 17 g by mouth daily. 06/03/23  Yes Doroteo Gasmen, MD  senna (SENOKOT) 8.6 MG TABS tablet Take  1 tablet (8.6 mg total) by mouth daily as needed for mild constipation or moderate constipation. 06/02/23  Yes Doroteo Gasmen, MD  SYMBICORT  160-4.5 MCG/ACT inhaler Inhale 2 puffs into the lungs in the morning and at  bedtime. 02/28/22  Yes [provider]  Blood Pressure Monitor DEVI Please provide patient with insurance approved blood pressure monitor. I10.0 11/15/20   Fleming, Zelda W, NP  Insulin  Pen Needle (EASY TOUCH PEN NEEDLES) 31G X 8 MM MISC Use to inject insulin  once daily and Victoza  once daily. Max of 2 pen needles a day. Must have office visit for refills 03/28/22   Joaquin Mulberry, MD  Misc. Devices MISC Please provide patient with nebulizer mask and tubing. [id number] 12/14/19   Fleming, Zelda W, NP  nitroGLYCERIN  (NITROSTAT ) 0.4 MG SL tablet Place 1 tablet (0.4 mg total) under the tongue every 5 (five) minutes as needed for chest pain. 03/15/20   Fleming, Zelda W, NP  Respiratory Therapy Supplies (NEBULIZER MASK ADULT) MISC 1 Units by Does not apply route daily as needed. ICD 10 J44.9 05/08/21   Collins Dean, NP     Thank you  Arlyne Bering, MD [facility]

## 2023-06-06 DIAGNOSIS — I959 Hypotension, unspecified: Secondary | ICD-10-CM | POA: Diagnosis not present

## 2023-06-06 DIAGNOSIS — J9811 Atelectasis: Secondary | ICD-10-CM | POA: Diagnosis not present

## 2023-06-06 DIAGNOSIS — E8729 Other acidosis: Secondary | ICD-10-CM | POA: Diagnosis not present

## 2023-06-06 DIAGNOSIS — S301XXD Contusion of abdominal wall, subsequent encounter: Secondary | ICD-10-CM | POA: Diagnosis not present

## 2023-06-06 LAB — CBC WITH DIFFERENTIAL/PLATELET
Abs Immature Granulocytes: 0.19 10*3/uL — ABNORMAL HIGH (ref 0.00–0.07)
Basophils Absolute: 0 10*3/uL (ref 0.0–0.1)
Basophils Relative: 0 %
Eosinophils Absolute: 0.2 10*3/uL (ref 0.0–0.5)
Eosinophils Relative: 2 %
HCT: 32.1 % — ABNORMAL LOW (ref 36.0–46.0)
Hemoglobin: 10 g/dL — ABNORMAL LOW (ref 12.0–15.0)
Immature Granulocytes: 2 %
Lymphocytes Relative: 11 %
Lymphs Abs: 1.3 10*3/uL (ref 0.7–4.0)
MCH: 29.2 pg (ref 26.0–34.0)
MCHC: 31.2 g/dL (ref 30.0–36.0)
MCV: 93.6 fL (ref 80.0–100.0)
Monocytes Absolute: 0.9 10*3/uL (ref 0.1–1.0)
Monocytes Relative: 7 %
Neutro Abs: 9.5 10*3/uL — ABNORMAL HIGH (ref 1.7–7.7)
Neutrophils Relative %: 78 %
Platelets: 199 10*3/uL (ref 150–400)
RBC: 3.43 MIL/uL — ABNORMAL LOW (ref 3.87–5.11)
RDW: 16.3 % — ABNORMAL HIGH (ref 11.5–15.5)
WBC: 12.2 10*3/uL — ABNORMAL HIGH (ref 4.0–10.5)
nRBC: 0 % (ref 0.0–0.2)

## 2023-06-06 LAB — BASIC METABOLIC PANEL WITH GFR
Anion gap: 8 (ref 5–15)
BUN: 26 mg/dL — ABNORMAL HIGH (ref 8–23)
CO2: 24 mmol/L (ref 22–32)
Calcium: 9.5 mg/dL (ref 8.9–10.3)
Chloride: 104 mmol/L (ref 98–111)
Creatinine, Ser: 2.02 mg/dL — ABNORMAL HIGH (ref 0.44–1.00)
GFR, Estimated: 25 mL/min — ABNORMAL LOW (ref >60)
Glucose, Bld: 217 mg/dL — ABNORMAL HIGH (ref 70–99)
Potassium: 5.4 mmol/L — ABNORMAL HIGH (ref 3.5–5.1)
Sodium: 136 mmol/L (ref 135–145)

## 2023-06-06 LAB — GLUCOSE, CAPILLARY
Glucose-Capillary: 142 mg/dL — ABNORMAL HIGH (ref 70–99)
Glucose-Capillary: 101 mg/dL — ABNORMAL HIGH (ref 70–99)
Glucose-Capillary: 247 mg/dL — ABNORMAL HIGH (ref 70–99)
Glucose-Capillary: 182 mg/dL — ABNORMAL HIGH (ref 70–99)
Glucose-Capillary: 100 mg/dL — ABNORMAL HIGH (ref 70–99)
Glucose-Capillary: 179 mg/dL — ABNORMAL HIGH (ref 70–99)

## 2023-06-06 LAB — CORTISOL-AM, BLOOD: Cortisol - AM: 5.1 ug/dL — ABNORMAL LOW (ref 6.7–22.6)

## 2023-06-06 MED ORDER — COSYNTROPIN 0.25 MG IJ SOLR
0.2500 mg | Freq: Once | INTRAMUSCULAR | Status: AC
  Administered 2023-06-07: 0.25 mg via INTRAVENOUS
  Filled 2023-06-06: qty 0.25

## 2023-06-06 MED ORDER — MIDODRINE HCL 5 MG PO TABS
5.0000 mg | ORAL_TABLET | Freq: Three times a day (TID) | ORAL | Status: DC
  Administered 2023-06-06 – 2023-06-12 (×19): 5 mg via ORAL
  Filled 2023-06-06 (×19): qty 1

## 2023-06-06 NOTE — TOC Progression Note (Signed)
 Transition of Care [facility]) - Progression Note    Patient Details  Name: [name] MRN: [id number] Date of Birth: May 17, 1950  [facility]) CM/SW Contact  Jeani Mill, RN Phone Number: 06/06/2023, 4:11 PM  Clinical Narrative:    PT recommends hospital bed and home health. Order placed. Will wait for closer to discharge to order from DME agency.  Patient is recent discharge and had [facility]. [facility] Carter accepted referral.  Patient's friend Athena Bland can transport patient home.  Patient uses [facility] insurance transportation to apts.  TOC will continue to follow for needs.  Expected Discharge Plan: Home w Home Health Services Barriers to Discharge: Continued Medical Work up  Expected Discharge Plan and Services     Post Acute Care Choice: Durable Medical Equipment Living arrangements for the past 2 months: Single Family Home                           HH Arranged: PT, OT HH Agency: [facility] Date HH Agency Contacted: 06/06/23 Time HH Agency Contacted: 1608 Representative spoke with at [facility] Agency: Renetta Carter   Social Determinants of Health (SDOH) Interventions SDOH Screenings   Food Insecurity: Food Insecurity Present (06/04/2023)  Housing: Low Risk  (06/04/2023)  Transportation Needs: No Transportation Needs (06/04/2023)  Utilities: Not At Risk (06/04/2023)  Depression (PHQ2-9): Medium Risk (09/22/2021)  Social Connections: Moderately Isolated (06/04/2023)  Tobacco Use: Medium Risk (06/04/2023)    Readmission Risk Interventions    05/29/2023    4:13 PM  Readmission Risk Prevention Plan  Transportation Screening Complete  Medication Review (RN Care Manager) Complete  PCP or Specialist appointment within 3-5 days of discharge Complete  HRI or Home Care Consult Complete  SW Recovery Care/Counseling Consult Complete  Palliative Care Screening Complete  Skilled Nursing Facility Not Applicable

## 2023-06-06 NOTE — Progress Notes (Signed)
 PROGRESS NOTE    [name]  BJY:[id number] DOB: 03-03-1950 DOA: 06/03/2023 PCP: [facility]   Brief Narrative:  [name] is a 73 y.o. female with medical history significant of history of breast cancer, MGUS, COPD, insulin -dependent DM type II, essential hypertension, anxiety, gout, hyperlipidemia, gerd, CKD stage IV, gout, and generalized weakness presented to the hospital with complaining of left-sided chest pain and generalized abdominal pain.   Patient was admitted from 4/18 to 4/27 acute hypoxic respiratory failure and sepsis  in the setting of community-acquired pneumonia and COPD exacerbation.  Patient required Levophed  drip in the setting of hypotension in the context of sepsis, required BiPAP completed course of IV ceftriaxone  azithromycin  for 7 days.  Blood culture from 4/13 no growth so far.  Patient was discharged to home with home PT OT management.   During admitting hospitalist evaluation, she denied any chest pain but complained of abdominal pain.  Reportedly, she has chronic cough.  It was verified that patient is full code.  He was agreeable with blood transfusion if needed.   At presentation to ED patient was hypotensive blood pressure dropped to 84/55.  Required nonrebreather and then weaned to 15 L O2 sat 100%. Normal troponin level 13.  EKG showing normal sinus rhythm heart rate 85.  Was found to have hyperkalemia 5.9 and creatinine 2.42 which is at her baseline. CBC showing persistent leukocytosis 14.9, stable H&H 9.2 and 29.  Chest x-ray shallow aspiration with consolidation with consolidation/atelectasis in the left lung base.   CT abdomen pelvis: IMPRESSION: 1. Extensive intramuscular hematoma within the left anterolateral abdominal wall, measuring up to 3.7 cm within the rectus sheath. 2. Small bilateral fat containing inguinal hernias. 3.  Aortic Atherosclerosis (ICD10-I70.0).   In the ED patient has been treated with 500 mL of LR  and 500 mL of NS bolus, Lokelma  and received fentanyl .  Admitted under hospitalist service.  Assessment & Plan:   Principal Problem:   Abdominal wall hematoma Active Problems:   Insulin  dependent type 2 diabetes mellitus (HCC)   COPD (chronic obstructive pulmonary disease) (HCC)   Left lower lobe pneumonia   Essential hypertension   MGUS (monoclonal gammopathy of unknown significance)   CKD (chronic kidney disease) stage 4, GFR 15-29 ml/min (HCC)   Generalized anxiety disorder   History of gout   Generalized weakness   History of breast cancer   Gout   Hematoma  Abdominal wall hematoma No reported history of trauma. CT abdomen pelvis showed Extensive intramuscular hematoma within the left anterolateral abdominal wall, measuring up to 3.7 cm within the rectus sheath. . Small bilateral fat containing inguinal hernias.  Due to hypotension and hematoma, admitting hospitalist ordered 1 unit of PRBC transfusion and abdominal binder.  Avoiding pharmacological DVT prophylaxis.  Patient continues to complain of left abdominal pain with no improvement however hemoglobin stable.   Sepsis secondary to left lower lobe pneumonia has been ruled out/chronic hypoxic respiratory failure dependent on 2 L of oxygen : -Patient has history of end-stage COPD and recent hospitalization for COPD exacerbation, at the verge of requiring intubation however patient declined and she was agreeable to treat with BiPAP.  She uses 2 L of oxygen  chronically.  As of now patient is still full code.  She was treated with ceftriaxone  azithromycin .  Blood culture was negative.  However there is no sputum culture is drawn.  Although chest x-ray shows possibility of consolidation however in the setting of no fever and improving leukocytosis,  I remained under impressed but due to repeated low blood pressure, I continued antibiotics.  Cultures remain negative.  Eventually PCCM was consulted due to persistent hypotension.  Procalcitonin  was checked which was unremarkable so antibiotics were discontinued which I agree with.  Hypotension/likely Addison's disease: Patient has been having persistent hypotension however upon chart review, it appears that patient did have intermittent hypotension during recent hospitalization as well given after she was weaned off of vasopressors.  Patient has remained asymptomatic.  Patient received 1 L fluid bolus in the ED.  Initially lactic acid was normal as well however that eventually jumped to 4.3.  She received another fluid bolus of 1 L and then lactic acid improved to 1.9.  She was hypotensive briefly overnight and received another 500 cc fluid bolus.  This is despite of her continuing to receive "Ringer's"  lactate at 125 cc/h.  Blood pressure remained borderline and very low at times so critical care was consulted, TSH was checked which was normal, a.m. cortisol is slightly low and now PCCM has ordered cosyntropin  stimulation test.  Appreciate their assistance.   Hyperkalemia Potassium 5.9 upon arrival, she received Lokelma , hyperkalemia improved but has hyperkalemia again at 5.4 yesterday, started on Lokelma , pending labs this morning..    Left-sided chest pain - Normal troponin.  EKG unremarkable. - Left-sided chest wall pain is noncardiac origin in the setting of referred pain from left-sided abdominal wall hematoma.   COPD without acute exacerbation - Initially patient required nonrebreather due to shortness of breath currently maintaining O2 sat over 95% on 2 L oxygen .  Physical exam no evidence of wheezing shortness of breath.  Patient is still complaining about cough. - Continue DuoNeb as needed and Dulera  twice daily.   Essential hypertension -Holding home blood pressure regimen in the setting of hypotension.   CKD stage IV -CMP showed creatinine 2.42 and GFR 20 upon arrival which is at her baseline.  Creatinine actually has better than baseline    Generalized anxiety  disorder -Continue Atarax  as needed.  Resumed Cymbalta .   Gout -Continue allopurinol    Hyperlipidemia - Continue Lipitor   History of breast cancer History of MGUS -Waiting for pharmacy verification if patient has been taking anastrozole .   Insulin -dependent DM type II - A1c 8.5 checked in 05/2023.  Currently on SSI.  Slightly hypoglycemic this morning.  Will continue current management.   Generalized weakness - Need to resume home health on discharge to continue the home PT and OT.  DVT prophylaxis: Place and maintain sequential compression device Start: 06/05/23 0736 SCDs Start: 06/04/23 0043 Place TED hose Start: 06/04/23 0043   Code Status: Full Code  Family Communication:  None present at bedside.  Plan of care discussed with patient in length and he/she verbalized understanding and agreed with it.  Status is: Inpatient Remains inpatient appropriate because: Patient hypotensive with hematoma.  Needs further monitoring.   Estimated body mass index is 28.81 kg/m as calculated from the following:   Height as of this encounter: 4\' 11"  (1.499 m).   Weight as of this encounter: 64.7 kg.    Nutritional Assessment: Body mass index is 28.81 kg/m.Aaron Aas Seen by dietician.  I agree with the assessment and plan as outlined below: Nutrition Status:        . Skin Assessment: I have examined the patient's skin and I agree with the wound assessment as performed by the wound care RN as outlined below:    Consultants:  None  Procedures:  None  Antimicrobials:  Anti-infectives (From admission, onward)    Start     Dose/Rate Route Frequency Ordered Stop   06/06/23 0500  vancomycin  (VANCOREADY) IVPB 750 mg/150 mL  Status:  Discontinued        750 mg 150 mL/hr over 60 Minutes Intravenous Every 48 hours 06/04/23 0313 06/05/23 1709   06/04/23 0100  ceFEPIme  (MAXIPIME ) 2 g in sodium chloride  0.9 % 100 mL IVPB  Status:  Discontinued        2 g 200 mL/hr over 30 Minutes  Intravenous Every 24 hours 06/04/23 0057 06/05/23 1709   06/04/23 0100  vancomycin  (VANCOREADY) IVPB 1500 mg/300 mL        1,500 mg 150 mL/hr over 120 Minutes Intravenous  Once 06/04/23 0058 06/04/23 0346         Subjective: Patient seen and examined.  She is now sitting at the edge of the bed, appears much better and comfortable than yesterday but still complains of severe pain in the left side of the abdomen.  Denies any shortness of breath or any other complaint.  Remain alert and oriented.  Discussed with her about PT OT assessment.  She is not in favor of going to SNF.  Prefers going home with home health PT OT.  Objective: Vitals:   06/05/23 2300 06/06/23 0027 06/06/23 0450 06/06/23 0825  BP: 99/74 99/63 102/67   Pulse: 76 85 73 86  Resp: 20 19 14 14   Temp:  98.2 F (36.8 C) 98.3 F (36.8 C) (!) 96.6 F (35.9 C)  TempSrc:  Oral Oral Axillary  SpO2: 99% (!) 87% 92% 98%  Weight:      Height:        Intake/Output Summary (Last 24 hours) at 06/06/2023 0900 Last data filed at 06/06/2023 0600 Gross per 24 hour  Intake 240 ml  Output 2000 ml  Net -1760 ml   Filed Weights   06/03/23 1936 06/04/23 1803  Weight: 64 kg 64.7 kg    Examination:  General exam: Appears calm and comfortable before I entered the room but then she started complaining of abdominal pain, grimacing Respiratory system: Clear to auscultation. Respiratory effort normal. Cardiovascular system: S1 & S2 heard, RRR. No JVD, murmurs, rubs, gallops or clicks. No pedal edema. Gastrointestinal system: Abdomen is nondistended, soft and generalized abdominal tenderness, more so in the left lower quadrant. No organomegaly or masses felt. Normal bowel sounds heard. Central nervous system: Alert and oriented. No focal neurological deficits. Extremities: Symmetric 5 x 5 power. Skin: No rashes, lesions or ulcers.    Data Reviewed: I have personally reviewed following labs and imaging studies  CBC: Recent Labs  Lab  06/01/23 0750 06/03/23 1939 06/04/23 0110 06/04/23 1155 06/04/23 2226 06/05/23 0242  WBC 15.0* 14.9* 13.1* 10.8*  --  9.8  NEUTROABS  --   --   --  7.3  --  7.1  HGB 9.0* 9.2* 8.6* 9.5* 9.0* 9.3*  HCT 28.2* 29.6* 29.1* 31.1* 29.2* 29.9*  MCV 91.3 94.0 96.4 93.7  --  92.9  PLT 217 232 209 183  --  187   Basic Metabolic Panel: Recent Labs  Lab 06/01/23 0750 06/03/23 1939 06/04/23 0110 06/04/23 0958 06/05/23 0242  NA 135 135 134* 135 137  K 5.0 5.9* 5.8* 4.9 5.4*  CL 100 98 102 102 108  CO2 24 24 21* 23 21*  GLUCOSE 217* 178* 151* 173* 193*  BUN 60* 49* 47* 42* 31*  CREATININE 2.52* 2.42* 2.25* 2.15* 1.97*  CALCIUM  9.1 9.3 8.9 9.0 8.6*  MG  --   --   --  1.8  --    GFR: Estimated Creatinine Clearance: 20.2 mL/min (A) (by C-G formula based on SCr of 1.97 mg/dL (H)). Liver Function Tests: Recent Labs  Lab 06/01/23 0750 06/03/23 1939 06/04/23 0110 06/04/23 0958  AST 15 26 20 21   ALT 15 27 24 24   ALKPHOS 75 84 78 68  BILITOT 0.7 0.9 1.0 1.0  PROT 6.0* 6.9 6.1* 5.9*  ALBUMIN  2.9* 3.2* 2.9* 2.7*   No results for input(s): "LIPASE", "AMYLASE" in the last 168 hours. No results for input(s): "AMMONIA" in the last 168 hours. Coagulation Profile: No results for input(s): "INR", "PROTIME" in the last 168 hours. Cardiac Enzymes: No results for input(s): "CKTOTAL", "CKMB", "CKMBINDEX", "TROPONINI" in the last 168 hours. BNP (last 3 results) No results for input(s): "PROBNP" in the last 8760 hours. HbA1C: No results for input(s): "HGBA1C" in the last 72 hours. CBG: Recent Labs  Lab 06/05/23 0802 06/05/23 1105 06/05/23 1648 06/05/23 2041 06/06/23 0718  GLUCAP 144* 167* 142* 186* 101*   Lipid Profile: No results for input(s): "CHOL", "HDL", "LDLCALC", "TRIG", "CHOLHDL", "LDLDIRECT" in the last 72 hours. Thyroid  Function Tests: Recent Labs    06/05/23 1344  TSH 2.827  FREET4 1.04   Anemia Panel: No results for input(s): "VITAMINB12", "FOLATE", "FERRITIN",  "TIBC", "IRON ", "RETICCTPCT" in the last 72 hours. Sepsis Labs: Recent Labs  Lab 06/04/23 0121 06/04/23 1100 06/04/23 1555 06/05/23 0754 06/05/23 1344  PROCALCITON  --   --   --   --  <0.10  LATICACIDVEN 1.4 4.3* 1.9 1.1  --     Recent Results (from the past 240 hours)  Respiratory (~20 pathogens) panel by PCR     Status: None   Collection Time: 06/04/23  1:01 AM   Specimen: Nasopharyngeal Swab; Respiratory  Result Value Ref Range Status   Adenovirus NOT DETECTED NOT DETECTED Final   Coronavirus 229E NOT DETECTED NOT DETECTED Final    Comment: (NOTE) The Coronavirus on the Respiratory Panel, DOES NOT test for the novel  Coronavirus (2019 nCoV)    Coronavirus HKU1 NOT DETECTED NOT DETECTED Final   Coronavirus NL63 NOT DETECTED NOT DETECTED Final   Coronavirus OC43 NOT DETECTED NOT DETECTED Final   Metapneumovirus NOT DETECTED NOT DETECTED Final   Rhinovirus / Enterovirus NOT DETECTED NOT DETECTED Final   Influenza A NOT DETECTED NOT DETECTED Final   Influenza B NOT DETECTED NOT DETECTED Final   Parainfluenza Virus 1 NOT DETECTED NOT DETECTED Final   Parainfluenza Virus 2 NOT DETECTED NOT DETECTED Final   Parainfluenza Virus 3 NOT DETECTED NOT DETECTED Final   Parainfluenza Virus 4 NOT DETECTED NOT DETECTED Final   Respiratory Syncytial Virus NOT DETECTED NOT DETECTED Final   Bordetella pertussis NOT DETECTED NOT DETECTED Final   Bordetella Parapertussis NOT DETECTED NOT DETECTED Final   Chlamydophila pneumoniae NOT DETECTED NOT DETECTED Final   Mycoplasma pneumoniae NOT DETECTED NOT DETECTED Final    Comment: Performed at [facility], [address] [place], [place] [address]  Culture, blood (routine x 2) Call MD if unable to obtain prior to antibiotics being given     Status: None (Preliminary result)   Collection Time: 06/04/23  1:10 AM   Specimen: BLOOD LEFT FOREARM  Result Value Ref Range Status   Specimen Description BLOOD LEFT FOREARM  Final   Special  Requests   Final    BOTTLES DRAWN AEROBIC  ONLY Blood Culture results may not be optimal due to an inadequate volume of blood received in culture bottles   Culture   Final    NO GROWTH 2 DAYS Performed at [facility], [address], [place], [place] [address]    Report Status PENDING  Incomplete  Culture, blood (routine x 2) Call MD if unable to obtain prior to antibiotics being given     Status: None (Preliminary result)   Collection Time: 06/04/23 11:00 AM   Specimen: BLOOD  Result Value Ref Range Status   Specimen Description BLOOD LEFT ANTECUBITAL  Final   Special Requests   Final    BOTTLES DRAWN AEROBIC AND ANAEROBIC Blood Culture results may not be optimal due to an inadequate volume of blood received in culture bottles   Culture   Final    NO GROWTH 2 DAYS Performed at [facility], [address], [place], [place] [address]    Report Status PENDING  Incomplete     Radiology Studies: ECHOCARDIOGRAM COMPLETE Result Date: 06/05/2023    ECHOCARDIOGRAM REPORT   Patient Name:   ALERIA ALEXANIAN Date of Exam: 06/05/2023 Medical Rec #:  [id number]      Height:       59.0 in Accession #:    [id number]     Weight:       142.6 lb Date of Birth:  06-30-1950      BSA:          1.598 m Patient Age:    73 years       BP:           93/56 mmHg Patient Gender: F              HR:           71 bpm. Exam Location:  Inpatient Procedure: 2D Echo, Cardiac Doppler and Color Doppler (Both Spectral and Color            Flow Doppler were utilized during procedure). Indications:    Shock R57.9  History:        Patient has prior history of Echocardiogram examinations, most                 recent 11/04/2022. Angina, Previous Myocardial Infarction and                 CAD, COPD and ESRD, Signs/Symptoms:Hypotension, Chest Pain and                 Dyspnea; Risk Factors:Hypertension, Dyslipidemia and Diabetes.                 Hx of Breast cancer.  Sonographer:    Terrilee Few RCS Referring Phys: [id number] OMAR  M ALBUSTAMI IMPRESSIONS  1. Left ventricular ejection fraction, by estimation, is 60 to 65%. The left ventricle has normal function. The left ventricle has no regional wall motion abnormalities. Left ventricular diastolic parameters are consistent with Grade I diastolic dysfunction (impaired relaxation).  2. Right ventricular systolic function is normal. The right ventricular size is normal.  3. The mitral valve is normal in structure. No evidence of mitral valve regurgitation. No evidence of mitral stenosis.  4. The aortic valve is tricuspid. Aortic valve regurgitation is moderate. No aortic stenosis is present. Aortic regurgitation PHT measures 422 msec.  5. The inferior vena cava is normal in size with greater than 50% respiratory variability, suggesting right atrial pressure of 3 mmHg. FINDINGS  Left Ventricle:  Left ventricular ejection fraction, by estimation, is 60 to 65%. The left ventricle has normal function. The left ventricle has no regional wall motion abnormalities. The left ventricular internal cavity size was normal in size. There is  no left ventricular hypertrophy. Left ventricular diastolic parameters are consistent with Grade I diastolic dysfunction (impaired relaxation). Normal left ventricular filling pressure. Right Ventricle: The right ventricular size is normal. No increase in right ventricular wall thickness. Right ventricular systolic function is normal. Left Atrium: Left atrial size was normal in size. Right Atrium: Right atrial size was normal in size. Pericardium: There is no evidence of pericardial effusion. Mitral Valve: The mitral valve is normal in structure. No evidence of mitral valve regurgitation. No evidence of mitral valve stenosis. Tricuspid Valve: The tricuspid valve is normal in structure. Tricuspid valve regurgitation is not demonstrated. No evidence of tricuspid stenosis. Aortic Valve: The aortic valve is tricuspid. Aortic valve regurgitation is moderate. Aortic  regurgitation PHT measures 422 msec. No aortic stenosis is present. Aortic valve peak gradient measures 6.6 mmHg. Pulmonic Valve: The pulmonic valve was normal in structure. Pulmonic valve regurgitation is not visualized. No evidence of pulmonic stenosis. Aorta: The aortic root is normal in size and structure. Venous: The inferior vena cava is normal in size with greater than 50% respiratory variability, suggesting right atrial pressure of 3 mmHg. IAS/Shunts: No atrial level shunt detected by color flow Doppler.  LEFT VENTRICLE PLAX 2D LVIDd:         4.50 cm   Diastology LVIDs:         2.90 cm   LV e' medial:    8.92 cm/s LV PW:         1.00 cm   LV E/e' medial:  9.0 LV IVS:        0.80 cm   LV e' lateral:   8.49 cm/s LVOT diam:     2.00 cm   LV E/e' lateral: 9.5 LV SV:         59 LV SV Index:   37 LVOT Area:     3.14 cm  RIGHT VENTRICLE             IVC RV S prime:     14.80 cm/s  IVC diam: 1.40 cm TAPSE (M-mode): 1.8 cm LEFT ATRIUM             Index        RIGHT ATRIUM          Index LA diam:        3.60 cm 2.25 cm/m   RA Area:     9.54 cm LA Vol (A2C):   29.4 ml 18.37 ml/m  RA Volume:   16.60 ml 10.39 ml/m LA Vol (A4C):   27.0 ml 16.90 ml/m LA Biplane Vol: 28.7 ml 17.96 ml/m  AORTIC VALVE AV Area (Vmax): 2.44 cm AV Vmax:        128.50 cm/s AV Peak Grad:   6.6 mmHg LVOT Vmax:      99.90 cm/s LVOT Vmean:     63.600 cm/s LVOT VTI:       0.189 m AI PHT:         422 msec  AORTA Ao Root diam: 2.90 cm Ao Asc diam:  3.70 cm MITRAL VALVE MV Area (PHT): 3.53 cm     SHUNTS MV Decel Time: 215 msec     Systemic VTI:  0.19 m MV E velocity: 80.60 cm/s   Systemic Diam: 2.00 cm MV  A velocity: 103.00 cm/s MV E/A ratio:  0.78 Maudine Sos MD Electronically signed by Maudine Sos MD Signature Date/Time: 06/05/2023/7:36:48 PM    Final     Scheduled Meds:  allopurinol   100 mg Oral Daily   atorvastatin   20 mg Oral Daily   [START ON 06/07/2023] cosyntropin   0.25 mg Intravenous Once   docusate sodium   100 mg Oral BID    DULoxetine   30 mg Oral Daily   ferrous sulfate   325 mg Oral BID WC   fluticasone   2 spray Each Nare Daily   hydrOXYzine   50 mg Oral QHS   insulin  aspart  0-5 Units Subcutaneous QHS   insulin  aspart  0-6 Units Subcutaneous TID WC   ipratropium-albuterol   3 mL Nebulization TID   midodrine   5 mg Oral TID WC   mometasone -formoterol   2 puff Inhalation BID   pantoprazole   40 mg Oral Daily   polyethylene glycol  17 g Oral Daily   sodium chloride  flush  3 mL Intravenous Q12H   sodium chloride  flush  3 mL Intravenous Q12H   sodium zirconium cyclosilicate   10 g Oral Daily   Continuous Infusions:     LOS: 2 days   Modena Andes, MD [facility]  06/06/2023, 9:00 AM  Total time spent: 50 minutes  *Please note that this is a verbal dictation therefore any spelling or grammatical errors are due to the "[employer] One" system interpretation.  Please page via Amion and do not message via secure chat for urgent patient care matters. Secure chat can be used for non urgent patient care matters.  How to contact the [facility] Attending or Consulting provider 7A - 7P or covering provider during after hours 7P -7A, for this patient?  Check the care team in [facility] and look for a) attending/consulting [facility] provider listed and b) the [facility] team listed. Page or secure chat 7A-7P. Log into [email or web address] and use [facility] universal password to access. If you do not have the password, please contact the hospital operator. Locate the [facility] provider you are looking for under [facility] and page to a number that you can be directly reached. If you still have difficulty reaching the provider, please page the [facility] (Director on Call) for the Hospitalists listed on amion for assistance.

## 2023-06-06 NOTE — Evaluation (Signed)
 Occupational Therapy Evaluation Patient Details Name: [name] MRN: [id number] DOB: 07-08-1950 Today's Date: 06/06/2023   History of Present Illness   73 y.o. female presents to [facility] 06/03/23 with L sided chest pain and generalized abdominal pain. Pt with abdominal wall hematoma, sepsis 2/2 LLL PNA. 4/17-4/27 admission for acute resp failure and sepsis 2/2 CAP and COPD exacerbation. PMH - breast CA, copd, cad, DM, HTN, ckd, lt shoulder replacement, anxiety, gout     Clinical Impressions At baseline, prior to admission on 05/23/23, pt largely completed ADLs Mod I with assistance for bathing and performed functional mobility Mod I with a quad cane or Rollator. At baseline, pt receives assistance from family for IADLs as needed. Pt now presents with decreased activity tolerance, pain affecting functional level, decreased B UE strength, impaired cardiopulmonary status, and decreased safety and independence with functional tasks. Pt currently demonstrates ability to complete UB ADLs with Set up to Min assist, LB ADLs with Max to Total assist, and bed mobility during/in preparation for functional tasks with Contact guard to Max assist. Pt participated well in session, is motivated to return to PLOF, and has good family support available 24/7. Suspect pt will make quick progress toward goals once pain control is improved. Pt will benefit from acute skilled OT services to address deficits outlined below and to increase safety and independence with functional tasks. Post acute discharge, pt will benefit from [facility] OT to maximize rehab potential paired with 24/7 supervision/assistance from family.       If plan is discharge home, recommend the following:   A lot of help with walking and/or transfers;A lot of help with bathing/dressing/bathroom;Assistance with cooking/housework;Help with stairs or ramp for entrance;Assist for transportation     Functional Status Assessment   Patient has had a recent  decline in their functional status and demonstrates the ability to make significant improvements in function in a reasonable and predictable amount of time.     Equipment Recommendations   Hospital bed     Recommendations for Other Services         Precautions/Restrictions   Precautions Precautions: Fall Precaution/Restrictions Comments: abdominal hematoma Required Braces or Orthoses: Other Brace Other Brace: abdominal binder, ordered 5/1 Restrictions Weight Bearing Restrictions Per Provider Order: No     Mobility Bed Mobility Overal bed mobility: Needs Assistance Bed Mobility: Rolling, Sidelying to Sit, Sit to Sidelying Rolling: Contact guard assist Sidelying to sit: Max assist, HOB elevated     Sit to sidelying: Min assist General bed mobility comments: able to roll and move LE's off EOB with cues for log-roll technique and CGA for safety. Required MaxA to raise trunk due to increased pain in abdomen. Unable to maintain seated position with MinA needed to return LE's onto bed    Transfers                   General transfer comment: unable due to pain      Balance       Sitting balance - Comments: unable to assess as pt wasn't able to tolerate seated position due to pain                                   ADL either performed or assessed with clinical judgement   ADL Overall ADL's : Needs assistance/impaired Eating/Feeding: Set up;Bed level   Grooming: Set up;Bed level   Upper Body Bathing: Minimal assistance;Moderate  assistance;Bed level;Cueing for compensatory techniques   Lower Body Bathing: Maximal assistance;Total assistance;Bed level;Cueing for compensatory techniques   Upper Body Dressing : Minimal assistance;Bed level   Lower Body Dressing: Maximal assistance;Total assistance;Cueing for compensatory techniques;Bed level     Toilet Transfer Details (indicate cue type and reason): unable this session due to  pain Toileting- Clothing Manipulation and Hygiene: Maximal assistance;Total assistance;Bed level;Cueing for compensatory techniques         General ADL Comments: Pt with decreased activity tolerance affecting functional level this session     Vision Baseline Vision/History: 1 Wears glasses Ability to See in Adequate Light: 0 Adequate (with glasses) Patient Visual Report: No change from baseline       Perception         Praxis         Pertinent Vitals/Pain Pain Assessment Pain Assessment: Faces Faces Pain Scale: Hurts whole lot Pain Location: LLQ with movement, coughing, and laughing Pain Descriptors / Indicators: Discomfort, Grimacing, Guarding, Sharp Pain Intervention(s): Limited activity within patient's tolerance, Monitored during session, Repositioned     Extremity/Trunk Assessment Upper Extremity Assessment Upper Extremity Assessment: Right hand dominant;Generalized weakness   Lower Extremity Assessment Lower Extremity Assessment: Defer to PT evaluation   Cervical / Trunk Assessment Cervical / Trunk Assessment: Normal   Communication Communication Communication: Impaired Factors Affecting Communication: Hearing impaired   Cognition Arousal: Alert Behavior During Therapy: WFL for tasks assessed/performed Cognition: No apparent impairments             OT - Cognition Comments: Pt AAOx4 and pleasant throughout session. Pt cognition largely [facility] for tasks assessed. Pt requiring mildly increased time for processing. However, this may be due to hearing impairment. Cognition not formally screened or assessed.                 Following commands: Intact       Cueing  General Comments   Cueing Techniques: Verbal cues;Tactile cues;Gestural cues  Pt provided with heart pillow to splint L abdomen during coughing and bed mobility   Exercises     Shoulder Instructions      Home Living Family/patient expects to be discharged to:: Private  residence Living Arrangements: Children;Other relatives (son and grandson) Available Help at Discharge: Family;Available 24 hours/day Type of Home: House Home Access: Stairs to enter [employer] of Steps: 4 Entrance Stairs-Rails: Right Home Layout: One level     Bathroom Shower/Tub: Producer, television/film/video: Standard Bathroom Accessibility: Yes   Home Equipment: Rollator (4 wheels);Cane - single point;Toilet riser          Prior Functioning/Environment Prior Level of Function : Needs assist       Physical Assist : Mobility (physical) Mobility (physical): Bed mobility   Mobility Comments: Using quad cane versus Rollator ADLs Comments: family provides transportation, uses roller carts in grocery store, son or grandson help with shower    OT Problem List: Decreased strength;Decreased activity tolerance;Pain   OT Treatment/Interventions: Self-care/ADL training;Therapeutic exercise;DME and/or AE instruction;Therapeutic activities;Energy conservation;Patient/family education;Balance training      OT Goals(Current goals can be found in the care plan section)   Acute Rehab OT Goals Patient Stated Goal: to return home with family OT Goal Formulation: With patient Time For Goal Achievement: 06/20/23 Potential to Achieve Goals: Good ADL Goals Pt Will Perform Grooming: with supervision;standing Pt Will Perform Upper Body Bathing: with supervision;sitting Pt Will Perform Lower Body Bathing: with min assist;sit to/from stand Pt Will Perform Lower Body Dressing: with min assist;sit  to/from stand Pt Will Transfer to Toilet: with supervision;ambulating;bedside commode (with least restrictive AD) Pt Will Perform Toileting - Clothing Manipulation and hygiene: with contact guard assist;sit to/from stand   OT Frequency:  Min 2X/week    Co-evaluation PT/OT/SLP Co-Evaluation/Treatment: Yes Reason for Co-Treatment: For patient/therapist safety;To address  functional/ADL transfers PT goals addressed during session: Mobility/safety with mobility;Balance OT goals addressed during session: ADL's and self-care;Strengthening/ROM      AM-PAC OT "6 Clicks" Daily Activity     Outcome Measure Help from another person eating meals?: A Little Help from another person taking care of personal grooming?: A Little Help from another person toileting, which includes using toliet, bedpan, or urinal?: Total Help from another person bathing (including washing, rinsing, drying)?: A Lot Help from another person to put on and taking off regular upper body clothing?: A Little Help from another person to put on and taking off regular lower body clothing?: Total 6 Click Score: 13   End of Session Equipment Utilized During Treatment: Oxygen  Nurse Communication: Mobility status;Other (comment) (Pt limited by pain. RN ordering abdominal binder.)  Activity Tolerance: Patient limited by pain Patient left: in bed;with call bell/phone within reach;with bed alarm set  OT Visit Diagnosis: Unsteadiness on feet (R26.81);Other abnormalities of gait and mobility (R26.89);Muscle weakness (generalized) (M62.81);Pain                Time: 4098-1191 OT Time Calculation (min): 20 min Charges:  OT General Charges $OT Visit: 1 Visit OT Evaluation $OT Eval Low Complexity: 1 Low  Aideen Fenster "Kyle" M., OTR/L, MA Acute Rehab [phone]  Walt Gunner 06/06/2023, 5:26 PM

## 2023-06-06 NOTE — Evaluation (Signed)
 Physical Therapy Evaluation Patient Details Name: [name] MRN: [id number] DOB: [birth year] Today's Date: 06/06/2023  History of Present Illness  73 y.o. female presents to [facility] 06/03/23 with L sided chest pain and generalized abdominal pain. Pt with abdominal wall hematoma, sepsis 2/2 LLL PNA. 4/17-4/27 admission for acute resp failure and sepsis 2/2 CAP and COPD exacerbation. PMH - breast CA, copd, cad, DM, HTN, ckd, lt shoulder replacement, anxiety, gout   Clinical Impression  Pt in bed upon arrival and agreeable to PT eval. Prior to hospital admit in 4/17, pt was ModI with a quad cane and needed assist with bed mobility. While in the hospital and upon return home, pt ambulated with a rollator. In today's session, pt was limited by severe pain in left abdomen with movement, coughing, and laughing. Pt was able to roll and bring LE's off EOB with CGA and increased time. Upon moving from sidelying to sit, pt had a severe increase in pain and required MaxA to raise trunk. She was unable to tolerate seated position with MinA required to bring LE's back onto the EOB. Pt currently with functional limitations due to the deficits listed below (see PT Problem List). Pt would benefit from acute skilled PT to address functional impairments. Pt reported having 24/7 physical assist available from family upon returning home. Recommending post-acute HHPT to work towards independence with mobility. Acute PT to follow.         If plan is discharge home, recommend the following: A lot of help with bathing/dressing/bathroom;Assist for transportation;Help with stairs or ramp for entrance;Assistance with cooking/housework;A lot of help with walking and/or transfers   Can travel by private vehicle    TBA    Equipment Recommendations Hospital bed     Functional Status Assessment Patient has had a recent decline in their functional status and demonstrates the ability to make significant improvements in function in  a reasonable and predictable amount of time.     Precautions / Restrictions Precautions Precautions: Fall Precaution/Restrictions Comments: abdominal hematoma Required Braces or Orthoses: Other Brace Other Brace: abdominal binder, ordered 5/1 Restrictions Weight Bearing Restrictions Per Provider Order: No      Mobility  Bed Mobility Overal bed mobility: Needs Assistance Bed Mobility: Rolling, Sidelying to Sit, Sit to Sidelying Rolling: Contact guard assist Sidelying to sit: Max assist, HOB elevated    Sit to sidelying: Min assist General bed mobility comments: able to roll and move LE's off EOB with cues for log-roll technique and CGA for safety. Required MaxA to raise trunk due to increased pain in abdomen. Unable to maintain seated position with MinA needed to return LE's onto bed    Transfers   General transfer comment: unable 2/2 pain      Balance     Sitting balance - Comments: unable to assess as pt wasn't able to tolerate seated position        Pertinent Vitals/Pain Pain Assessment Pain Assessment: Faces Faces Pain Scale: Hurts whole lot Pain Location: LLQ with movement, coughing, and laughing Pain Descriptors / Indicators: Discomfort, Grimacing, Guarding, Sharp Pain Intervention(s): Limited activity within patient's tolerance, Monitored during session, Repositioned    Home Living Family/patient expects to be discharged to:: Private residence Living Arrangements: Children;Other relatives (son and grandson) Available Help at Discharge: Family;Available 24 hours/day Type of Home: House Home Access: Stairs to enter Entrance Stairs-Rails: Right Entrance Stairs-Number of Steps: 4   Home Layout: One level Home Equipment: Rollator (4 wheels);Cane - single point;Toilet riser  Prior Function Prior Level of Function : Needs assist     Physical Assist : Mobility (physical) Mobility (physical): Bed mobility   Mobility Comments: Using quad cane, reported  increased difficulty with bed mobility with family assisting ADLs Comments: family provides transportation, uses roller carts in grocery store, daughter helps with shower     Extremity/Trunk Assessment   Upper Extremity Assessment Upper Extremity Assessment: Defer to OT evaluation    Lower Extremity Assessment Lower Extremity Assessment: Generalized weakness    Cervical / Trunk Assessment Cervical / Trunk Assessment: Normal  Communication   Communication Communication: Impaired Factors Affecting Communication: Hearing impaired    Cognition Arousal: Alert Behavior During Therapy: WFL for tasks assessed/performed   PT - Cognitive impairments: No apparent impairments  Following commands: Intact       Cueing Cueing Techniques: Verbal cues, Tactile cues, Gestural cues      PT Assessment Patient needs continued PT services  PT Problem List Decreased strength;Decreased balance;Decreased mobility;Decreased activity tolerance;Cardiopulmonary status limiting activity;Pain       PT Treatment Interventions DME instruction;Gait training;Stair training;Functional mobility training;Therapeutic activities;Therapeutic exercise;Balance training;Patient/family education    PT Goals (Current goals can be found in the Care Plan section)  Acute Rehab PT Goals Patient Stated Goal: to be able to move better PT Goal Formulation: With patient Time For Goal Achievement: 06/20/23 Potential to Achieve Goals: Fair    Frequency Min 2X/week     Co-evaluation   Reason for Co-Treatment: For patient/therapist safety;To address functional/ADL transfers PT goals addressed during session: Mobility/safety with mobility;Balance         AM-PAC PT "6 Clicks" Mobility  Outcome Measure Help needed turning from your back to your side while in a flat bed without using bedrails?: A Little Help needed moving from lying on your back to sitting on the side of a flat bed without using bedrails?: A  Lot Help needed moving to and from a bed to a chair (including a wheelchair)?: A Lot Help needed standing up from a chair using your arms (e.g., wheelchair or bedside chair)?: A Lot Help needed to walk in hospital room?: A Lot Help needed climbing 3-5 steps with a railing? : A Lot 6 Click Score: 13    End of Session Equipment Utilized During Treatment: Oxygen  Activity Tolerance: Patient limited by pain Patient left: in bed;with call bell/phone within reach Nurse Communication: Mobility status;Other (comment) (need for abdominal binder) PT Visit Diagnosis: Unsteadiness on feet (R26.81);Other abnormalities of gait and mobility (R26.89);Muscle weakness (generalized) (M62.81);Pain Pain - Right/Left: Left Pain - part of body:  (Abdomen)    Time: 4098-1191 PT Time Calculation (min) (ACUTE ONLY): 20 min   Charges:   PT Evaluation $PT Eval Low Complexity: 1 Low   PT General Charges $$ ACUTE PT VISIT: 1 Visit        Orysia Blas, PT, DPT Secure Chat Preferred  Rehab Office [phone]   [name] 06/06/2023, 4:46 PM

## 2023-06-06 NOTE — Progress Notes (Signed)
     Durable Medical Equipment (From admission, onward)        Start     Ordered  06/06/23 1611  For home use only DME Hospital bed  Once      Question Answer Comment Length of Need Lifetime  The above medical condition requires: Patient requires the ability to reposition frequently  Bed type Semi-electric  Support Surface: Gel Overlay    06/06/23 1610

## 2023-06-06 NOTE — Care Management Important Message (Signed)
 Important Message  Patient Details  Name: [name] MRN: [id number] Date of Birth: Jun 28, 1950   Important Message Given:  Yes - Medicare IM     Wynonia Hedges 06/06/2023, 3:05 PM

## 2023-06-06 NOTE — Progress Notes (Signed)
 NAME:  [name], MRN:  [id number], DOB:  March 11, 1950, LOS: 2 ADMISSION DATE:  06/03/2023, CONSULTATION DATE:  06/05/2023 REFERRING MD:  Modena Andes, MD, CHIEF COMPLAINT: hypotension    History of Present Illness:  A 73 yr old female patient with COPD on Dulera  and HOT @ 2 L [place], poorly controlled DM-2 (HbA1c 8.2%), HTN, anxiety, GERD, CKD-4 (Cr 2.2 at baseline), anemia of chronic illness, breast cancer, and MGUS, presented to the hospital with complaining of left-sided chest pain and left sided abdominal pain, mainly laterally, worse with taking deep breath, cough, touch and movement like pending or twisting. She has dry cough, without SOB, DOE, wheezing, LL edema, N/V/D, f/c/r, or rash.  She was admitted from 4/18-4/27 [facility], acute hypoxic respiratory failure, PNA, and sepsis Rx for 7 days of Rocephin  and Zithromax . Discharged on home O2 Rx, @ 2 L [place]. Compliant with meds. Quit smoking few yrs ago, smoked 0.3 ppd for 50 yrs.  At presentation to ED patient was hypotensive and given 2.5 L [place]. She was on NRM, but now 1.5 L [place] and her SpO2 97%. Her BP is borderline low but she denied dizziness or palpitations.   Pertinent  Medical History  COPD on Dulera  and HOT @ 2 L [place], poorly controlled DM-2 (HbA1c 8.2%), HTN, anxiety, GERD, CKD-4 (Cr 2.2 at baseline), anemia of chronic illness, breast cancer, MGUS, dyslipidemia   Significant Hospital Events: Including procedures, antibiotic start and stop dates in addition to other pertinent events   4/29: admission to the floor 4/30: PCCM was consulted for hypotension  5/1: on 1.5 L [place], uses the I/S. BP is better  Interim History / Subjective:    Objective   Blood pressure 102/67, pulse 86, temperature (!) 96.6 F (35.9 C), temperature source Axillary, resp. rate 14, height 4\' 11"  (1.499 m), weight 64.7 kg, SpO2 98%.        Intake/Output Summary (Last 24 hours) at 06/06/2023 0853 Last data filed at 06/06/2023 0600 Gross per 24 hour  Intake 240 ml   Output 2000 ml  Net -1760 ml   Filed Weights   06/03/23 1936 06/04/23 1803  Weight: 64 kg 64.7 kg    Examination: General: alert, oriented x4, and comfortable. On 1.5 L [place]. SpO2 97%  HENT: PERL, normal pharynx and oral mucosa. No LNE or thyromegaly. No JVD Lungs: symmetrical air entry bilaterally. Left basal crackles. No wheezing Cardiovascular: NL S1/S2. No m/g/r Abdomen: no distension, but abd wall tenderness left>right laterally Extremities: no edema. Symmetrical  Neuro: nonfocal   Resolved Hospital Problem list   Lactic acidosis   Assessment & Plan:  Abd wall hematoma leading to hypotension and lactic acidosis. Lactic acidosis resolved with IVF bolus. Hypotension is better with IVF. Pain meds can drop her BP. Sep 2024, Echo EF 65%, G1DD  06/05/2023, Echo: same -PCT, Trop, TFT: noted -d/c Abx -Low cortisol: will do the Cosyntropin  stim test  -Laxatives -Increase Midodrine     Mild LLL atelectasis, doubt PNA -I/S  -Duoneb -SCD   COPD of Dulera  and HOT @ 2 L [place] -As above -Outpatient PFT   Poorly controlled DM-2 (HbA1c 8.2%) HTN Anxiety GERD CKD-4 (Cr 2.2 at baseline) Anemia of chronic illness: mild drop HyperK Other Mx: per primary team   Labs   CBC: Recent Labs  Lab 06/01/23 0750 06/03/23 1939 06/04/23 0110 06/04/23 1155 06/04/23 2226 06/05/23 0242  WBC 15.0* 14.9* 13.1* 10.8*  --  9.8  NEUTROABS  --   --   --  7.3  --  7.1  HGB 9.0* 9.2* 8.6* 9.5* 9.0* 9.3*  HCT 28.2* 29.6* 29.1* 31.1* 29.2* 29.9*  MCV 91.3 94.0 96.4 93.7  --  92.9  PLT 217 232 209 183  --  187    Basic Metabolic Panel: Recent Labs  Lab 06/01/23 0750 06/03/23 1939 06/04/23 0110 06/04/23 0958 06/05/23 0242  NA 135 135 134* 135 137  K 5.0 5.9* 5.8* 4.9 5.4*  CL 100 98 102 102 108  CO2 24 24 21* 23 21*  GLUCOSE 217* 178* 151* 173* 193*  BUN 60* 49* 47* 42* 31*  CREATININE 2.52* 2.42* 2.25* 2.15* 1.97*  CALCIUM  9.1 9.3 8.9 9.0 8.6*  MG  --   --   --  1.8  --     GFR: Estimated Creatinine Clearance: 20.2 mL/min (A) (by C-G formula based on SCr of 1.97 mg/dL (H)). Recent Labs  Lab 06/03/23 1939 06/04/23 0110 06/04/23 0121 06/04/23 1100 06/04/23 1155 06/04/23 1555 06/05/23 0242 06/05/23 0754 06/05/23 1344  PROCALCITON  --   --   --   --   --   --   --   --  <0.10  WBC 14.9* 13.1*  --   --  10.8*  --  9.8  --   --   LATICACIDVEN  --   --  1.4 4.3*  --  1.9  --  1.1  --     Liver Function Tests: Recent Labs  Lab 06/01/23 0750 06/03/23 1939 06/04/23 0110 06/04/23 0958  AST 15 26 20 21   ALT 15 27 24 24   ALKPHOS 75 84 78 68  BILITOT 0.7 0.9 1.0 1.0  PROT 6.0* 6.9 6.1* 5.9*  ALBUMIN  2.9* 3.2* 2.9* 2.7*   No results for input(s): "LIPASE", "AMYLASE" in the last 168 hours. No results for input(s): "AMMONIA" in the last 168 hours.  ABG    Component Value Date/Time   PHART 7.222 (L) 05/24/2023 0151   PCO2ART 43.4 05/24/2023 0151   PO2ART 58 (L) 05/24/2023 0151   HCO3 17.8 (L) 05/24/2023 0151   TCO2 19 (L) 05/24/2023 0151   ACIDBASEDEF 9.0 (H) 05/24/2023 0151   O2SAT 84 05/24/2023 0151     Coagulation Profile: No results for input(s): "INR", "PROTIME" in the last 168 hours.  Cardiac Enzymes: No results for input(s): "CKTOTAL", "CKMB", "CKMBINDEX", "TROPONINI" in the last 168 hours.  HbA1C: Hgb A1c MFr Bld  Date/Time Value Ref Range Status  05/24/2023 06:05 AM 8.2 (H) 4.8 - 5.6 % Final    Comment:    (NOTE) Pre diabetes:          5.7%-6.4%  Diabetes:              >6.4%  Glycemic control for   <7.0% adults with diabetes   02/08/2021 11:26 AM 5.9 (H) 4.8 - 5.6 % Final    Comment:    (NOTE) Pre diabetes:          5.7%-6.4%  Diabetes:              >6.4%  Glycemic control for   <7.0% adults with diabetes     CBG: Recent Labs  Lab 06/05/23 0802 06/05/23 1105 06/05/23 1648 06/05/23 2041 06/06/23 0718  GLUCAP 144* 167* 142* 186* 101*    Review of Systems:   Review of Systems  Constitutional:  Negative  for chills and fever.  HENT:  Positive for nosebleeds. Negative for congestion, sinus pain and sore throat.   Respiratory:  Positive  for cough. Negative for hemoptysis, sputum production, shortness of breath, wheezing and stridor.   Cardiovascular:  Positive for chest pain. Negative for palpitations, orthopnea, leg swelling and PND.  Gastrointestinal:  Negative for diarrhea, nausea and vomiting.  Genitourinary:  Negative for dysuria.  Skin:  Negative for itching and rash.   Past Medical History:  She,  has a past medical history of Ambulates with cane, Angina, Arthritis, Asthma, Breast cancer (HCC), Cancer (HCC) (09/04/2017), Carpal tunnel syndrome, bilateral (11/26/2016), Cholelithiasis, Cocaine abuse (HCC) (07/2012), COPD (chronic obstructive pulmonary disease) (HCC), Coronary artery disease, Diabetes mellitus without mention of complication, Dysphagia, Fatty liver disease, nonalcoholic (2009), GERD (gastroesophageal reflux disease), Headache, Hearing loss, History of colonic polyps (2009), Hyperlipidemia, Hypertension, Iron  deficiency anemia, Myocardial infarction (HCC), Nausea with vomiting, Ulnar neuropathy at elbow, 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   Surgical History:   Past Surgical History:  Procedure Laterality Date   ABDOMINAL ANGIOGRAM  02/20/2011   Procedure: ABDOMINAL ANGIOGRAM;  Surgeon: Sharene Dauer, MD;  Location: [facility] CATH LAB;  Service: Cardiovascular;;   BREAST BIOPSY Left    CARPAL TUNNEL RELEASE Right 07/04/2017   Procedure: RIGHT CARPAL TUNNEL RELEASE;  Surgeon: Brunilda Capra, MD;  Location: [facility];  Service: Orthopedics;  Laterality: Right;   CARPAL TUNNEL RELEASE Left 09/12/2017   Procedure: LEFT CARPAL TUNNEL RELEASE;  Surgeon: Brunilda Capra, MD;  Location: [facility];  Service: Orthopedics;  Laterality: Left;   CATARACT EXTRACTION     CHOLECYSTECTOMY N/A 11/11/2018   Procedure: ATTEMPTED LAPAROSCOPIC  CHOLECUSTECOMY,  OPEN CHOLECYSTECTOMY;  Surgeon: Oza Blumenthal, MD;  Location: [facility] OR;  Service: General;  Laterality: N/A;   COLONOSCOPY  11/30/2011   Procedure: COLONOSCOPY;  Surgeon: Pietro Bridegroom, MD;  Location: [facility] ENDOSCOPY;  Service: Endoscopy;  Laterality: N/A;   CORONARY ANGIOPLASTY WITH STENT PLACEMENT  02/20/2011   ERCP N/A 10/03/2018   Procedure: ENDOSCOPIC RETROGRADE CHOLANGIOPANCREATOGRAPHY (ERCP);  Surgeon: Asencion Blacksmith, MD;  Location: [facility];  Service: Endoscopy;  Laterality: N/A;   ESOPHAGOGASTRODUODENOSCOPY  11/30/2011   Procedure: ESOPHAGOGASTRODUODENOSCOPY (EGD);  Surgeon: Pietro Bridegroom, MD;  Location: [facility];  Service: Endoscopy;  Laterality: N/A;   ESOPHAGOGASTRODUODENOSCOPY N/A 02/23/2015   Procedure: ESOPHAGOGASTRODUODENOSCOPY (EGD);  Surgeon: Kenney Peacemaker, MD;  Location: [facility] ENDOSCOPY;  Service: Endoscopy;  Laterality: N/A;   EYE SURGERY Bilateral    cataracts removed   INTRAOPERATIVE CHOLANGIOGRAM N/A 11/11/2018   Procedure: Intraoperative Cholangiogram;  Surgeon: Oza Blumenthal, MD;  Location: [facility] OR;  Service: General;  Laterality: N/A;   IR REMOVAL TUN ACCESS W/ PORT W/O FL MOD SED  06/30/2020   LEFT HEART CATHETERIZATION WITH CORONARY ANGIOGRAM N/A 02/20/2011   Procedure: LEFT HEART CATHETERIZATION WITH CORONARY ANGIOGRAM;  Surgeon: Sharene Dauer, MD;  Location: [facility] CATH LAB;  Service: Cardiovascular;  Laterality: N/A;   MASTECTOMY Right 11/05/2017   MASTECTOMY W/ SENTINEL NODE BIOPSY Right 11/27/2017   MASTECTOMY W/ SENTINEL NODE BIOPSY Right 11/27/2017   Procedure: RIGHT TOTAL MASTECTOMY WITH SENTINEL LYMPH NODE BIOPSY;  Surgeon: Ayesha Lente, MD;  Location: [facility] OR;  Service: General;  Laterality: Right;   PORT A CATH REVISION Left 05/07/2018   Procedure: PORT A CATH REVISION;  Surgeon: Ayesha Lente, MD;  Location: [facility] ORS;  Service: General;  Laterality: Left;   PORTACATH PLACEMENT Left 11/27/2017   Procedure: INSERTION  PORT-A-CATH;  Surgeon: Ayesha Lente, MD;  Location: [facility] OR;  Service: General;  Laterality: Left;   REVERSE SHOULDER ARTHROPLASTY Left 03/09/2021  Procedure: REVERSE SHOULDER ARTHROPLASTY;  Surgeon: Ellard Gunning, MD;  Location: [facility];  Service: Orthopedics;  Laterality: Left;   RIGHT COLECTOMY  02/2007   for post polypectomy colonic perforation   SPHINCTEROTOMY  10/03/2018   Procedure: SPHINCTEROTOMY;  Surgeon: Asencion Blacksmith, MD;  Location: [facility];  Service: Endoscopy;;   TUBAL LIGATION       Social History:   reports that she quit smoking about 13 years ago. Her smoking use included cigarettes. She started smoking about 60 years ago. She has a 11.8 pack-year smoking history. She has never used smokeless tobacco. She reports that she does not currently use alcohol. She reports that she does not currently use drugs after having used the following drugs: Cocaine.   Family History:  Her family history includes Cancer in her father; Diabetes in her mother and sister; Heart disease in her mother. There is no history of Colon cancer or Breast cancer.   Allergies No Known Allergies   Home Medications  Prior to Admission medications   Medication Sig Start Date End Date Taking? Authorizing Provider  acetaminophen  (TYLENOL ) 650 MG CR tablet Take 650 mg by mouth every 8 (eight) hours as needed for pain.   Yes [provider]  albuterol  (PROVENTIL ) (2.5 MG/3ML) 0.083% nebulizer solution Take 3 mLs (2.5 mg total) by nebulization every 6 (six) hours as needed for wheezing or shortness of breath. 08/02/21  Yes Burnette, Leory Rands, PA-C  albuterol  (VENTOLIN  HFA) 108 (90 Base) MCG/ACT inhaler Inhale 1-2 puffs into the lungs every 6 (six) hours as needed for wheezing or shortness of breath. 09/14/21  Yes Dulce Gibbs M, PA-C  allopurinol  (ZYLOPRIM ) 100 MG tablet Take 100 mg by mouth daily.   Yes [provider]  anastrozole  (ARIMIDEX ) 1 MG tablet TAKE ONE TABLET BY MOUTH  ONCE DAILY 06/18/22  Yes Cameron Cea, MD  aspirin  81 MG EC tablet Take 1 tablet (81 mg total) by mouth daily. Patient taking differently: Take 81 mg by mouth in the morning and at bedtime. 10/13/18  Yes Pokhrel, Laxman, MD  atorvastatin  (LIPITOR) 20 MG tablet Take 20 mg by mouth daily.   Yes [provider]  diclofenac  Sodium (VOLTAREN ) 1 % GEL Apply 1 Application topically 4 (four) times daily. Patient taking differently: Apply 1 Application topically in the morning and at bedtime. 09/13/21  Yes Gudena, Vinay, MD  DULoxetine  (CYMBALTA ) 30 MG capsule Take 30 mg by mouth daily.   Yes [provider]  feeding supplement, GLUCERNA SHAKE, (GLUCERNA SHAKE) LIQD Take 237 mLs by mouth 2 (two) times daily between meals. 06/02/23  Yes Doroteo Gasmen, MD  ferrous sulfate  325 (65 FE) MG tablet Take 1 tablet (325 mg total) by mouth 2 (two) times daily with a meal. 06/02/23  Yes Doroteo Gasmen, MD  fluticasone  (FLONASE ) 50 MCG/ACT nasal spray Place 2 sprays into both nostrils daily. 05/10/21  Yes Newlin, Enobong, MD  gabapentin  (NEURONTIN ) 100 MG capsule Take 100-200 mg by mouth See admin instructions. Take one tablet by mouth in the morning and afternoon, then take 2 tablets every night per patient 03/12/22  Yes [provider]  glipiZIDE  2.5 MG TABS Take 2.5 mg by mouth daily before breakfast. 06/02/23  Yes Doroteo Gasmen, MD  hydrOXYzine  (ATARAX ) 25 MG tablet Take 50 mg by mouth at bedtime. 05/07/23  Yes [provider]  methocarbamol  (ROBAXIN ) 500 MG tablet Take 1 tablet (500 mg total) by mouth 2 (two) times daily. 03/09/23  Yes Prosperi,  Christian H, PA-C  pantoprazole  (PROTONIX ) 40 MG tablet TAKE ONE TABLET BY MOUTH EVERY DAY FOR ACID REFLUX 11/02/21  Yes Fleming, Zelda W, NP  polyethylene glycol (MIRALAX  / GLYCOLAX ) 17 g packet Take 17 g by mouth daily. 06/03/23  Yes Doroteo Gasmen, MD  senna (SENOKOT) 8.6 MG TABS tablet Take 1 tablet (8.6 mg total) by mouth daily as  needed for mild constipation or moderate constipation. 06/02/23  Yes Fonnie Iba I, MD  SYMBICORT  160-4.5 MCG/ACT inhaler Inhale 2 puffs into the lungs in the morning and at bedtime. 02/28/22  Yes [provider]  Blood Pressure Monitor DEVI Please provide patient with insurance approved blood pressure monitor. I10.0 11/15/20   Fleming, Zelda W, NP  Insulin  Pen Needle (EASY TOUCH PEN NEEDLES) 31G X 8 MM MISC Use to inject insulin  once daily and Victoza  once daily. Max of 2 pen needles a day. Must have office visit for refills 03/28/22   Joaquin Mulberry, MD  Misc. Devices MISC Please provide patient with nebulizer mask and tubing. [id number] 12/14/19   Fleming, Zelda W, NP  nitroGLYCERIN  (NITROSTAT ) 0.4 MG SL tablet Place 1 tablet (0.4 mg total) under the tongue every 5 (five) minutes as needed for chest pain. 03/15/20   Fleming, Zelda W, NP  Respiratory Therapy Supplies (NEBULIZER MASK ADULT) MISC 1 Units by Does not apply route daily as needed. ICD 10 J44.9 05/08/21   Collins Dean, NP       Thank you   Arlyne Bering, MD [facility]

## 2023-06-07 DIAGNOSIS — S301XXD Contusion of abdominal wall, subsequent encounter: Secondary | ICD-10-CM | POA: Diagnosis not present

## 2023-06-07 LAB — CBC WITH DIFFERENTIAL/PLATELET
Abs Immature Granulocytes: 0.14 10*3/uL — ABNORMAL HIGH (ref 0.00–0.07)
Basophils Absolute: 0 10*3/uL (ref 0.0–0.1)
Basophils Relative: 0 %
Eosinophils Absolute: 0.2 10*3/uL (ref 0.0–0.5)
Eosinophils Relative: 2 %
HCT: 30.7 % — ABNORMAL LOW (ref 36.0–46.0)
Hemoglobin: 9.6 g/dL — ABNORMAL LOW (ref 12.0–15.0)
Immature Granulocytes: 1 %
Lymphocytes Relative: 10 %
Lymphs Abs: 1 10*3/uL (ref 0.7–4.0)
MCH: 29 pg (ref 26.0–34.0)
MCHC: 31.3 g/dL (ref 30.0–36.0)
MCV: 92.7 fL (ref 80.0–100.0)
Monocytes Absolute: 0.9 10*3/uL (ref 0.1–1.0)
Monocytes Relative: 9 %
Neutro Abs: 7.6 10*3/uL (ref 1.7–7.7)
Neutrophils Relative %: 78 %
Platelets: 212 10*3/uL (ref 150–400)
RBC: 3.31 MIL/uL — ABNORMAL LOW (ref 3.87–5.11)
RDW: 16 % — ABNORMAL HIGH (ref 11.5–15.5)
WBC: 9.9 10*3/uL (ref 4.0–10.5)
nRBC: 0 % (ref 0.0–0.2)

## 2023-06-07 LAB — BASIC METABOLIC PANEL WITH GFR
Anion gap: 9 (ref 5–15)
BUN: 23 mg/dL (ref 8–23)
CO2: 25 mmol/L (ref 22–32)
Calcium: 9.5 mg/dL (ref 8.9–10.3)
Chloride: 102 mmol/L (ref 98–111)
Creatinine, Ser: 1.93 mg/dL — ABNORMAL HIGH (ref 0.44–1.00)
GFR, Estimated: 27 mL/min — ABNORMAL LOW (ref >60)
Glucose, Bld: 98 mg/dL (ref 70–99)
Potassium: 5.5 mmol/L — ABNORMAL HIGH (ref 3.5–5.1)
Sodium: 136 mmol/L (ref 135–145)

## 2023-06-07 LAB — GLUCOSE, CAPILLARY
Glucose-Capillary: 107 mg/dL — ABNORMAL HIGH (ref 70–99)
Glucose-Capillary: 233 mg/dL — ABNORMAL HIGH (ref 70–99)
Glucose-Capillary: 246 mg/dL — ABNORMAL HIGH (ref 70–99)
Glucose-Capillary: 148 mg/dL — ABNORMAL HIGH (ref 70–99)
Glucose-Capillary: 121 mg/dL — ABNORMAL HIGH (ref 70–99)

## 2023-06-07 LAB — ACTH STIMULATION, 3 TIME POINTS
Cortisol, 30 Min: 21.9 ug/dL
Cortisol, 60 Min: 25.7 ug/dL
Cortisol, Base: 11.1 ug/dL

## 2023-06-07 NOTE — Plan of Care (Signed)
  Problem: Coping: Goal: Ability to adjust to condition or change in health will improve Outcome: Progressing   Problem: Fluid Volume: Goal: Ability to maintain a balanced intake and output will improve Outcome: Progressing   Problem: Tissue Perfusion: Goal: Adequacy of tissue perfusion will improve Outcome: Progressing   Problem: Education: Goal: Knowledge of General Education information will improve Description: Including pain rating scale, medication(s)/side effects and non-pharmacologic comfort measures Outcome: Progressing   Problem: Health Behavior/Discharge Planning: Goal: Ability to manage health-related needs will improve Outcome: Progressing   Problem: Clinical Measurements: Goal: Ability to maintain clinical measurements within normal limits will improve Outcome: Progressing Goal: Will remain free from infection Outcome: Progressing   Problem: Nutrition: Goal: Adequate nutrition will be maintained Outcome: Progressing   Problem: Elimination: Goal: Will not experience complications related to bowel motility Outcome: Progressing Goal: Will not experience complications related to urinary retention Outcome: Progressing   Problem: Pain Managment: Goal: General experience of comfort will improve and/or be controlled Outcome: Progressing   Problem: Safety: Goal: Ability to remain free from injury will improve Outcome: Progressing   Problem: Activity: Goal: Ability to tolerate increased activity will improve Outcome: Progressing   Problem: Clinical Measurements: Goal: Ability to maintain a body temperature in the normal range will improve Outcome: Progressing   Problem: Respiratory: Goal: Ability to maintain a clear airway will improve Outcome: Progressing

## 2023-06-07 NOTE — Progress Notes (Signed)
 PROGRESS NOTE    [name]  ZOX:[id number] DOB: Jun 18, 1950 DOA: 06/03/2023 PCP: [facility]   Brief Narrative:  [name] is a 73 y.o. female with medical history significant of history of breast cancer, MGUS, COPD, insulin -dependent DM type II, essential hypertension, anxiety, gout, hyperlipidemia, gerd, CKD stage IV, gout, and generalized weakness presented to the hospital with complaining of left-sided chest pain and generalized abdominal pain.   Patient was admitted from 4/18 to 4/27 acute hypoxic respiratory failure and sepsis  in the setting of community-acquired pneumonia and COPD exacerbation.  Patient required Levophed  drip in the setting of hypotension in the context of sepsis, required BiPAP completed course of IV ceftriaxone  azithromycin  for 7 days.  Blood culture from 4/13 no growth so far.  Patient was discharged to home with home PT OT management.   During admitting hospitalist evaluation, she denied any chest pain but complained of abdominal pain.  Reportedly, she has chronic cough.  It was verified that patient is full code.  He was agreeable with blood transfusion if needed.   At presentation to ED patient was hypotensive blood pressure dropped to 84/55.  Required nonrebreather and then weaned to 15 L O2 sat 100%. Normal troponin level 13.  EKG showing normal sinus rhythm heart rate 85.  Was found to have hyperkalemia 5.9 and creatinine 2.42 which is at her baseline. CBC showing persistent leukocytosis 14.9, stable H&H 9.2 and 29.  Chest x-ray shallow aspiration with consolidation with consolidation/atelectasis in the left lung base.   CT abdomen pelvis: IMPRESSION: 1. Extensive intramuscular hematoma within the left anterolateral abdominal wall, measuring up to 3.7 cm within the rectus sheath. 2. Small bilateral fat containing inguinal hernias. 3.  Aortic Atherosclerosis (ICD10-I70.0).   In the ED patient has been treated with 500 mL of LR  and 500 mL of NS bolus, Lokelma  and received fentanyl .  Admitted under hospitalist service.  Details below.  Assessment & Plan:   Principal Problem:   Abdominal wall hematoma Active Problems:   Insulin  dependent type 2 diabetes mellitus (HCC)   COPD (chronic obstructive pulmonary disease) (HCC)   Left lower lobe pneumonia   Essential hypertension   MGUS (monoclonal gammopathy of unknown significance)   CKD (chronic kidney disease) stage 4, GFR 15-29 ml/min (HCC)   Generalized anxiety disorder   History of gout   Generalized weakness   History of breast cancer   Gout   Hematoma  Abdominal wall hematoma No reported history of trauma. CT abdomen pelvis showed Extensive intramuscular hematoma within the left anterolateral abdominal wall, measuring up to 3.7 cm within the rectus sheath. . Small bilateral fat containing inguinal hernias.  Due to hypotension and hematoma, admitting hospitalist ordered 1 unit of PRBC transfusion and abdominal binder.  Avoiding pharmacological DVT prophylaxis.  Patient continues to complain of left abdominal pain, however hemoglobin stable.   Sepsis secondary to left lower lobe pneumonia has been ruled out/chronic hypoxic respiratory failure dependent on 2 L of oxygen : -Patient has history of end-stage COPD and recent hospitalization for COPD exacerbation, at the verge of requiring intubation however patient declined and she was agreeable to treat with BiPAP.  She uses 2 L of oxygen  chronically.  As of now patient is still full code.  She was treated with ceftriaxone  azithromycin .  Blood culture was negative.  However there is no sputum culture is drawn.  Although chest x-ray shows possibility of consolidation however in the setting of no fever and improving leukocytosis,  I remained under impressed but due to repeated low blood pressure, I continued antibiotics.  Cultures remain negative.  Eventually PCCM was consulted due to persistent hypotension.  Procalcitonin was  checked which was unremarkable so antibiotics were discontinued which I agree with.  Hypotension/likely Addison's disease: Patient has been having persistent hypotension however upon chart review, it appears that patient did have intermittent hypotension during recent hospitalization as well given after she was weaned off of vasopressors.  Patient has remained asymptomatic.  Patient received 1 L fluid bolus in the ED.  Initially lactic acid was normal as well however that eventually jumped to 4.3.  She received another fluid bolus of 1 L and then lactic acid improved to 1.9.  She was hypotensive briefly overnight and received another 500 cc fluid bolus.  This is despite of her continuing to receive "Ringer's"  lactate at 125 cc/h.  Blood pressure remained borderline and very low at times so critical care was consulted, TSH was checked which was normal, a.m. cortisol is slightly low and now PCCM has ordered cosyntropin  stimulation test which is still pending.  Midodrine  ordered.  Blood pressure low normal but better than before.  Appreciate their assistance.   Hyperkalemia Potassium 5.9 upon arrival, she received Lokelma , hyperkalemia improved but has hyperkalemia again at 5.4 yesterday, started on daily Lokelma  but still with hyperkalemia 5.5.  Continue to monitor on telemetry.  Repeat labs in the morning.   Left-sided chest pain - Normal troponin.  EKG unremarkable. - Left-sided chest wall pain is noncardiac origin in the setting of referred pain from left-sided abdominal wall hematoma.   COPD without acute exacerbation - Initially patient required nonrebreather due to shortness of breath currently maintaining O2 sat over 95% on 2 L oxygen .  Physical exam no evidence of wheezing shortness of breath.  Patient is still complaining about cough. - Continue DuoNeb as needed and Dulera  twice daily.   Essential hypertension -Holding home blood pressure regimen in the setting of hypotension.   CKD stage  IV -CMP showed creatinine 2.42 and GFR 20 upon arrival which is at her baseline.  Creatinine actually has better than baseline    Generalized anxiety disorder -Continue Atarax  as needed.  Resumed Cymbalta .   Gout -Continue allopurinol    Hyperlipidemia - Continue Lipitor   History of breast cancer History of MGUS -Waiting for pharmacy verification if patient has been taking anastrozole .   Insulin -dependent DM type II - A1c 8.5 checked in 05/2023.  Currently on SSI.  Labile blood sugar but mostly controlled.  Will continue current management.   Generalized weakness - Seen by PT OT, need to resume home health on discharge to continue the home PT and OT.  DVT prophylaxis: Place and maintain sequential compression device Start: 06/05/23 0736 SCDs Start: 06/04/23 0043 Place TED hose Start: 06/04/23 0043   Code Status: Full Code  Family Communication:  None present at bedside.  Plan of care discussed with patient in length and he/she verbalized understanding and agreed with it.  Status is: Inpatient Remains inpatient appropriate because: Patient is still borderline hypotensive with hyperkalemia.  Cosyntropin  stimulation test is pending.   Estimated body mass index is 28.81 kg/m as calculated from the following:   Height as of this encounter: 4\' 11"  (1.499 m).   Weight as of this encounter: 64.7 kg.    Nutritional Assessment: Body mass index is 28.81 kg/m.Aaron Aas Seen by dietician.  I agree with the assessment and plan as outlined below: Nutrition Status:        .  Skin Assessment: I have examined the patient's skin and I agree with the wound assessment as performed by the wound care RN as outlined below:    Consultants:  None  Procedures:  None  Antimicrobials:  Anti-infectives (From admission, onward)    Start     Dose/Rate Route Frequency Ordered Stop   06/06/23 0500  vancomycin  (VANCOREADY) IVPB 750 mg/150 mL  Status:  Discontinued        750 mg 150 mL/hr over 60  Minutes Intravenous Every 48 hours 06/04/23 0313 06/05/23 1709   06/04/23 0100  ceFEPIme  (MAXIPIME ) 2 g in sodium chloride  0.9 % 100 mL IVPB  Status:  Discontinued        2 g 200 mL/hr over 30 Minutes Intravenous Every 24 hours 06/04/23 0057 06/05/23 1709   06/04/23 0100  vancomycin  (VANCOREADY) IVPB 1500 mg/300 mL        1,500 mg 150 mL/hr over 120 Minutes Intravenous  Once 06/04/23 0058 06/04/23 0346         Subjective: Patient seen and examined.  Although still complains of abdominal pain but that is improving and she appears much more comfortable today.  No other complaint.  Denies any shortness of breath.  Objective: Vitals:   06/06/23 1913 06/06/23 2315 06/07/23 0328 06/07/23 0720  BP: 114/60 101/61 114/68 100/67  Pulse: 66 71 71 78  Resp: 14 13 19 20   Temp: 97.7 F (36.5 C) 99 F (37.2 C) 99.3 F (37.4 C) 98.5 F (36.9 C)  TempSrc: Oral Oral Oral Oral  SpO2: 97% 100% 98% 100%  Weight:      Height:        Intake/Output Summary (Last 24 hours) at 06/07/2023 0842 Last data filed at 06/07/2023 0700 Gross per 24 hour  Intake 243 ml  Output 1425 ml  Net -1182 ml   Filed Weights   06/03/23 1936 06/04/23 1803  Weight: 64 kg 64.7 kg    Examination:  General exam: Appears calm and comfortable  Respiratory system: Clear to auscultation. Respiratory effort normal. Cardiovascular system: S1 & S2 heard, RRR. No JVD, murmurs, rubs, gallops or clicks. No pedal edema. Gastrointestinal system: Abdomen is nondistended, soft and generalized abdominal tenderness, more pronounced at left lower quadrant. No organomegaly or masses felt. Normal bowel sounds heard. Central nervous system: Alert and oriented. No focal neurological deficits. Extremities: Symmetric 5 x 5 power. Skin: No rashes, lesions or ulcers.  Psychiatry: Judgement and insight appear normal. Mood & affect appropriate.   Data Reviewed: I have personally reviewed following labs and imaging studies  CBC: Recent Labs   Lab 06/04/23 0110 06/04/23 1155 06/04/23 2226 06/05/23 0242 06/06/23 0930 06/07/23 0642  WBC 13.1* 10.8*  --  9.8 12.2* 9.9  NEUTROABS  --  7.3  --  7.1 9.5* 7.6  HGB 8.6* 9.5* 9.0* 9.3* 10.0* 9.6*  HCT 29.1* 31.1* 29.2* 29.9* 32.1* 30.7*  MCV 96.4 93.7  --  92.9 93.6 92.7  PLT 209 183  --  187 199 212   Basic Metabolic Panel: Recent Labs  Lab 06/04/23 0110 06/04/23 0958 06/05/23 0242 06/06/23 0930 06/07/23 0642  NA 134* 135 137 136 136  K 5.8* 4.9 5.4* 5.4* 5.5*  CL 102 102 108 104 102  CO2 21* 23 21* 24 25  GLUCOSE 151* 173* 193* 217* 98  BUN 47* 42* 31* 26* 23  CREATININE 2.25* 2.15* 1.97* 2.02* 1.93*  CALCIUM  8.9 9.0 8.6* 9.5 9.5  MG  --  1.8  --   --   --  GFR: Estimated Creatinine Clearance: 20.6 mL/min (A) (by C-G formula based on SCr of 1.93 mg/dL (H)). Liver Function Tests: Recent Labs  Lab 06/01/23 0750 06/03/23 1939 06/04/23 0110 06/04/23 0958  AST 15 26 20 21   ALT 15 27 24 24   ALKPHOS 75 84 78 68  BILITOT 0.7 0.9 1.0 1.0  PROT 6.0* 6.9 6.1* 5.9*  ALBUMIN  2.9* 3.2* 2.9* 2.7*   No results for input(s): "LIPASE", "AMYLASE" in the last 168 hours. No results for input(s): "AMMONIA" in the last 168 hours. Coagulation Profile: No results for input(s): "INR", "PROTIME" in the last 168 hours. Cardiac Enzymes: No results for input(s): "CKTOTAL", "CKMB", "CKMBINDEX", "TROPONINI" in the last 168 hours. BNP (last 3 results) No results for input(s): "PROBNP" in the last 8760 hours. HbA1C: No results for input(s): "HGBA1C" in the last 72 hours. CBG: Recent Labs  Lab 06/06/23 1129 06/06/23 1253 06/06/23 1537 06/06/23 2115 06/07/23 0616  GLUCAP 247* 182* 100* 179* 107*   Lipid Profile: No results for input(s): "CHOL", "HDL", "LDLCALC", "TRIG", "CHOLHDL", "LDLDIRECT" in the last 72 hours. Thyroid  Function Tests: Recent Labs    06/05/23 1344  TSH 2.827  FREET4 1.04   Anemia Panel: No results for input(s): "VITAMINB12", "FOLATE", "FERRITIN",  "TIBC", "IRON ", "RETICCTPCT" in the last 72 hours. Sepsis Labs: Recent Labs  Lab 06/04/23 0121 06/04/23 1100 06/04/23 1555 06/05/23 0754 06/05/23 1344  PROCALCITON  --   --   --   --  <0.10  LATICACIDVEN 1.4 4.3* 1.9 1.1  --     Recent Results (from the past 240 hours)  Respiratory (~20 pathogens) panel by PCR     Status: None   Collection Time: 06/04/23  1:01 AM   Specimen: Nasopharyngeal Swab; Respiratory  Result Value Ref Range Status   Adenovirus NOT DETECTED NOT DETECTED Final   Coronavirus 229E NOT DETECTED NOT DETECTED Final    Comment: (NOTE) The Coronavirus on the Respiratory Panel, DOES NOT test for the novel  Coronavirus (2019 nCoV)    Coronavirus HKU1 NOT DETECTED NOT DETECTED Final   Coronavirus NL63 NOT DETECTED NOT DETECTED Final   Coronavirus OC43 NOT DETECTED NOT DETECTED Final   Metapneumovirus NOT DETECTED NOT DETECTED Final   Rhinovirus / Enterovirus NOT DETECTED NOT DETECTED Final   Influenza A NOT DETECTED NOT DETECTED Final   Influenza B NOT DETECTED NOT DETECTED Final   Parainfluenza Virus 1 NOT DETECTED NOT DETECTED Final   Parainfluenza Virus 2 NOT DETECTED NOT DETECTED Final   Parainfluenza Virus 3 NOT DETECTED NOT DETECTED Final   Parainfluenza Virus 4 NOT DETECTED NOT DETECTED Final   Respiratory Syncytial Virus NOT DETECTED NOT DETECTED Final   Bordetella pertussis NOT DETECTED NOT DETECTED Final   Bordetella Parapertussis NOT DETECTED NOT DETECTED Final   Chlamydophila pneumoniae NOT DETECTED NOT DETECTED Final   Mycoplasma pneumoniae NOT DETECTED NOT DETECTED Final    Comment: Performed at [facility], [address], [place], [place] [address]  Culture, blood (routine x 2) Call MD if unable to obtain prior to antibiotics being given     Status: None (Preliminary result)   Collection Time: 06/04/23  1:10 AM   Specimen: BLOOD LEFT FOREARM  Result Value Ref Range Status   Specimen Description BLOOD LEFT FOREARM  Final   Special  Requests   Final    BOTTLES DRAWN AEROBIC ONLY Blood Culture results may not be optimal due to an inadequate volume of blood received in culture bottles   Culture  Final    NO GROWTH 3 DAYS Performed at [facility], [address] [place], [place] [address]    Report Status PENDING  Incomplete  Culture, blood (routine x 2) Call MD if unable to obtain prior to antibiotics being given     Status: None (Preliminary result)   Collection Time: 06/04/23 11:00 AM   Specimen: BLOOD  Result Value Ref Range Status   Specimen Description BLOOD LEFT ANTECUBITAL  Final   Special Requests   Final    BOTTLES DRAWN AEROBIC AND ANAEROBIC Blood Culture results may not be optimal due to an inadequate volume of blood received in culture bottles   Culture   Final    NO GROWTH 3 DAYS Performed at [facility], [address], [place], [place] [address]    Report Status PENDING  Incomplete     Radiology Studies: ECHOCARDIOGRAM COMPLETE Result Date: 06/05/2023    ECHOCARDIOGRAM REPORT   Patient Name:   SIA RABIDEAU Date of Exam: 06/05/2023 Medical Rec #:  [id number]      Height:       59.0 in Accession #:    [id number]     Weight:       142.6 lb Date of Birth:  12/27/1950      BSA:          1.598 m Patient Age:    73 years       BP:           93/56 mmHg Patient Gender: F              HR:           71 bpm. Exam Location:  Inpatient Procedure: 2D Echo, Cardiac Doppler and Color Doppler (Both Spectral and Color            Flow Doppler were utilized during procedure). Indications:    Shock R57.9  History:        Patient has prior history of Echocardiogram examinations, most                 recent 11/04/2022. Angina, Previous Myocardial Infarction and                 CAD, COPD and ESRD, Signs/Symptoms:Hypotension, Chest Pain and                 Dyspnea; Risk Factors:Hypertension, Dyslipidemia and Diabetes.                 Hx of Breast cancer.  Sonographer:    Terrilee Few RCS Referring Phys: [id number] OMAR  M ALBUSTAMI IMPRESSIONS  1. Left ventricular ejection fraction, by estimation, is 60 to 65%. The left ventricle has normal function. The left ventricle has no regional wall motion abnormalities. Left ventricular diastolic parameters are consistent with Grade I diastolic dysfunction (impaired relaxation).  2. Right ventricular systolic function is normal. The right ventricular size is normal.  3. The mitral valve is normal in structure. No evidence of mitral valve regurgitation. No evidence of mitral stenosis.  4. The aortic valve is tricuspid. Aortic valve regurgitation is moderate. No aortic stenosis is present. Aortic regurgitation PHT measures 422 msec.  5. The inferior vena cava is normal in size with greater than 50% respiratory variability, suggesting right atrial pressure of 3 mmHg. FINDINGS  Left Ventricle: Left ventricular ejection fraction, by estimation, is 60 to 65%. The left ventricle has normal function. The left ventricle has no regional wall motion  abnormalities. The left ventricular internal cavity size was normal in size. There is  no left ventricular hypertrophy. Left ventricular diastolic parameters are consistent with Grade I diastolic dysfunction (impaired relaxation). Normal left ventricular filling pressure. Right Ventricle: The right ventricular size is normal. No increase in right ventricular wall thickness. Right ventricular systolic function is normal. Left Atrium: Left atrial size was normal in size. Right Atrium: Right atrial size was normal in size. Pericardium: There is no evidence of pericardial effusion. Mitral Valve: The mitral valve is normal in structure. No evidence of mitral valve regurgitation. No evidence of mitral valve stenosis. Tricuspid Valve: The tricuspid valve is normal in structure. Tricuspid valve regurgitation is not demonstrated. No evidence of tricuspid stenosis. Aortic Valve: The aortic valve is tricuspid. Aortic valve regurgitation is moderate. Aortic  regurgitation PHT measures 422 msec. No aortic stenosis is present. Aortic valve peak gradient measures 6.6 mmHg. Pulmonic Valve: The pulmonic valve was normal in structure. Pulmonic valve regurgitation is not visualized. No evidence of pulmonic stenosis. Aorta: The aortic root is normal in size and structure. Venous: The inferior vena cava is normal in size with greater than 50% respiratory variability, suggesting right atrial pressure of 3 mmHg. IAS/Shunts: No atrial level shunt detected by color flow Doppler.  LEFT VENTRICLE PLAX 2D LVIDd:         4.50 cm   Diastology LVIDs:         2.90 cm   LV e' medial:    8.92 cm/s LV PW:         1.00 cm   LV E/e' medial:  9.0 LV IVS:        0.80 cm   LV e' lateral:   8.49 cm/s LVOT diam:     2.00 cm   LV E/e' lateral: 9.5 LV SV:         59 LV SV Index:   37 LVOT Area:     3.14 cm  RIGHT VENTRICLE             IVC RV S prime:     14.80 cm/s  IVC diam: 1.40 cm TAPSE (M-mode): 1.8 cm LEFT ATRIUM             Index        RIGHT ATRIUM          Index LA diam:        3.60 cm 2.25 cm/m   RA Area:     9.54 cm LA Vol (A2C):   29.4 ml 18.37 ml/m  RA Volume:   16.60 ml 10.39 ml/m LA Vol (A4C):   27.0 ml 16.90 ml/m LA Biplane Vol: 28.7 ml 17.96 ml/m  AORTIC VALVE AV Area (Vmax): 2.44 cm AV Vmax:        128.50 cm/s AV Peak Grad:   6.6 mmHg LVOT Vmax:      99.90 cm/s LVOT Vmean:     63.600 cm/s LVOT VTI:       0.189 m AI PHT:         422 msec  AORTA Ao Root diam: 2.90 cm Ao Asc diam:  3.70 cm MITRAL VALVE MV Area (PHT): 3.53 cm     SHUNTS MV Decel Time: 215 msec     Systemic VTI:  0.19 m MV E velocity: 80.60 cm/s   Systemic Diam: 2.00 cm MV A velocity: 103.00 cm/s MV E/A ratio:  0.78 Maudine Sos MD Electronically signed by Maudine Sos MD Signature Date/Time: 06/05/2023/7:36:48 PM  Final     Scheduled Meds:  allopurinol   100 mg Oral Daily   atorvastatin   20 mg Oral Daily   docusate sodium   100 mg Oral BID   DULoxetine   30 mg Oral Daily   ferrous sulfate   325 mg  Oral BID WC   fluticasone   2 spray Each Nare Daily   hydrOXYzine   50 mg Oral QHS   insulin  aspart  0-5 Units Subcutaneous QHS   insulin  aspart  0-6 Units Subcutaneous TID WC   ipratropium-albuterol   3 mL Nebulization TID   midodrine   5 mg Oral TID WC   mometasone -formoterol   2 puff Inhalation BID   pantoprazole   40 mg Oral Daily   polyethylene glycol  17 g Oral Daily   sodium chloride  flush  3 mL Intravenous Q12H   sodium chloride  flush  3 mL Intravenous Q12H   sodium zirconium cyclosilicate   10 g Oral Daily   Continuous Infusions:     LOS: 3 days   Modena Andes, MD [facility]  06/07/2023, 8:42 AM  Total time spent: 50 minutes  *Please note that this is a verbal dictation therefore any spelling or grammatical errors are due to the "[employer]" system interpretation.  Please page via Amion and do not message via secure chat for urgent patient care matters. Secure chat can be used for non urgent patient care matters.  How to contact the [facility] Attending or Consulting provider 7A - 7P or covering provider during after hours 7P -7A, for this patient?  Check the care team in [facility] and look for a) attending/consulting [facility] provider listed and b) the [facility] team listed. Page or secure chat 7A-7P. Log into [email or web address] and use [place]'s universal password to access. If you do not have the password, please contact the hospital operator. Locate the [facility] provider you are looking for under [facility] and page to a number that you can be directly reached. If you still have difficulty reaching the provider, please page the [facility] (Director on Call) for the Hospitalists listed on amion for assistance.

## 2023-06-07 NOTE — Progress Notes (Signed)
 Mobility Specialist Progress Note;   06/07/23 1107  Mobility  Activity Transferred from bed to chair  Level of Assistance Minimal assist, patient does 75% or more  Assistive Device Other (Comment) (HHA)  Distance Ambulated (ft) 5 ft  Activity Response Tolerated well  Mobility Referral Yes  Mobility visit 1 Mobility  Mobility Specialist Start Time (ACUTE ONLY) 1107  Mobility Specialist Stop Time (ACUTE ONLY) 1117  Mobility Specialist Time Calculation (min) (ACUTE ONLY) 10 min   Pt agreeable to mobility. Required MinA for bed mobility w/ use of bed pads and MinA to stand from Eob to transfer over to chair. VSS on 1.5LO2. C/o of L abd pain throughout, however more tolerable this session. Pt left in chair with all needs met, call bell in reach. RN notified.   Janit Meline Mobility Specialist Please contact via SecureChat or [employer] [phone]

## 2023-06-07 NOTE — Progress Notes (Signed)
 06/07/2023 Stim test not really c/w adrenal insufficiency. Agree with midodrine . Will be available PRN  Ardelle Kos MD PCCM

## 2023-06-08 DIAGNOSIS — S301XXS Contusion of abdominal wall, sequela: Secondary | ICD-10-CM

## 2023-06-08 LAB — GLUCOSE, CAPILLARY
Glucose-Capillary: 104 mg/dL — ABNORMAL HIGH (ref 70–99)
Glucose-Capillary: 151 mg/dL — ABNORMAL HIGH (ref 70–99)
Glucose-Capillary: 147 mg/dL — ABNORMAL HIGH (ref 70–99)
Glucose-Capillary: 188 mg/dL — ABNORMAL HIGH (ref 70–99)

## 2023-06-08 LAB — RENAL FUNCTION PANEL
Albumin: 3.1 g/dL — ABNORMAL LOW (ref 3.5–5.0)
Anion gap: 12 (ref 5–15)
BUN: 30 mg/dL — ABNORMAL HIGH (ref 8–23)
CO2: 21 mmol/L — ABNORMAL LOW (ref 22–32)
Calcium: 9.5 mg/dL (ref 8.9–10.3)
Chloride: 104 mmol/L (ref 98–111)
Creatinine, Ser: 2.06 mg/dL — ABNORMAL HIGH (ref 0.44–1.00)
GFR, Estimated: 25 mL/min — ABNORMAL LOW (ref >60)
Glucose, Bld: 189 mg/dL — ABNORMAL HIGH (ref 70–99)
Phosphorus: 3.5 mg/dL (ref 2.5–4.6)
Potassium: 5.8 mmol/L — ABNORMAL HIGH (ref 3.5–5.1)
Sodium: 137 mmol/L (ref 135–145)

## 2023-06-08 MED ORDER — ONDANSETRON HCL 4 MG/2ML IJ SOLN
4.0000 mg | Freq: Once | INTRAMUSCULAR | Status: AC
  Administered 2023-06-08: 4 mg via INTRAVENOUS
  Filled 2023-06-08: qty 2

## 2023-06-08 NOTE — Progress Notes (Signed)
 PROGRESS NOTE    [name]  ZOX:[id number] DOB: April 04, 1950 DOA: 06/03/2023 PCP: [facility]   Brief Narrative:  [name] is a 73 y.o. female with medical history significant of history of breast cancer, MGUS, COPD, insulin -dependent DM type II, essential hypertension, anxiety, gout, hyperlipidemia, gerd, CKD stage IV, gout, and generalized weakness presented to the hospital with complaining of left-sided chest pain and generalized abdominal pain.   Patient was admitted from 4/18 to 4/27 acute hypoxic respiratory failure and sepsis  in the setting of community-acquired pneumonia and COPD exacerbation.  Patient required Levophed  drip in the setting of hypotension in the context of sepsis, required BiPAP completed course of IV ceftriaxone  azithromycin  for 7 days.  Blood culture from 4/13 no growth so far.  Patient was discharged to home with home PT OT management.   During admitting hospitalist evaluation, she denied any chest pain but complained of abdominal pain.  Reportedly, she has chronic cough.  It was verified that patient is full code.  He was agreeable with blood transfusion if needed.   At presentation to ED patient was hypotensive blood pressure dropped to 84/55.  Required nonrebreather and then weaned to 15 L O2 sat 100%. Normal troponin level 13.  EKG showing normal sinus rhythm heart rate 85.  Was found to have hyperkalemia 5.9 and creatinine 2.42 which is at her baseline. CBC showing persistent leukocytosis 14.9, stable H&H 9.2 and 29.  Chest x-ray shallow aspiration with consolidation with consolidation/atelectasis in the left lung base.   CT abdomen pelvis: IMPRESSION: 1. Extensive intramuscular hematoma within the left anterolateral abdominal wall, measuring up to 3.7 cm within the rectus sheath. 2. Small bilateral fat containing inguinal hernias. 3.  Aortic Atherosclerosis (ICD10-I70.0).   In the ED patient has been treated with 500 mL of LR  and 500 mL of NS bolus, Lokelma  and received fentanyl .  Admitted under hospitalist service.  Details below.  06/08/2023: Patient seen.  No recent lab work.  Will repeat renal panel.  Last potassium was 5.6.  Patient remains in painful distress.  Assessment & Plan:   Principal Problem:   Abdominal wall hematoma Active Problems:   Essential hypertension   Insulin  dependent type 2 diabetes mellitus (HCC)   COPD (chronic obstructive pulmonary disease) (HCC)   MGUS (monoclonal gammopathy of unknown significance)   Left lower lobe pneumonia   CKD (chronic kidney disease) stage 4, GFR 15-29 ml/min (HCC)   Generalized anxiety disorder   History of gout   Generalized weakness   History of breast cancer   Gout   Hematoma  Abdominal wall hematoma No reported history of trauma. CT abdomen pelvis showed Extensive intramuscular hematoma within the left anterolateral abdominal wall, measuring up to 3.7 cm within the rectus sheath. . Small bilateral fat containing inguinal hernias.  Due to hypotension and hematoma, admitting hospitalist ordered 1 unit of PRBC transfusion and abdominal binder.  Avoiding pharmacological DVT prophylaxis.  Patient continues to complain of left abdominal pain, however hemoglobin stable. 06/08/2023: Patient remains in pain.  Continue to monitor closely.   Sepsis secondary to left lower lobe pneumonia has been ruled out/chronic hypoxic respiratory failure dependent on 2 L of oxygen : -Patient has history of end-stage COPD and recent hospitalization for COPD exacerbation, at the verge of requiring intubation however patient declined and she was agreeable to treat with BiPAP.  She uses 2 L of oxygen  chronically.  As of now patient is still full code.  She was treated  with ceftriaxone  azithromycin .  Blood culture was negative.  However there is no sputum culture is drawn.  Although chest x-ray shows possibility of consolidation however in the setting of no fever and improving  leukocytosis, I remained under impressed but due to repeated low blood pressure, I continued antibiotics.  Cultures remain negative.  Eventually PCCM was consulted due to persistent hypotension.  Procalcitonin was checked which was unremarkable so antibiotics were discontinued which I agree with.  Hypotension/likely Addison's disease: Patient has been having persistent hypotension however upon chart review, it appears that patient did have intermittent hypotension during recent hospitalization as well given after she was weaned off of vasopressors.  Patient has remained asymptomatic.  Patient received 1 L fluid bolus in the ED.  Initially lactic acid was normal as well however that eventually jumped to 4.3.  She received another fluid bolus of 1 L and then lactic acid improved to 1.9.  She was hypotensive briefly overnight and received another 500 cc fluid bolus.  This is despite of her continuing to receive "Ringer's"  lactate at 125 cc/h.  Blood pressure remained borderline and very low at times so critical care was consulted, TSH was checked which was normal, a.m. cortisol is slightly low and now PCCM has ordered cosyntropin  stimulation test which is still pending.  Midodrine  ordered.  Blood pressure low normal but better than before.  Appreciate their assistance.   Hyperkalemia Potassium 5.9 upon arrival, she received Lokelma , hyperkalemia improved but has hyperkalemia again at 5.4 yesterday, started on daily Lokelma  but still with hyperkalemia 5.5.  Continue to monitor on telemetry.  Repeat labs in the morning. 06/08/2023: Repeat renal panel.   Left-sided chest pain - Normal troponin.  EKG unremarkable. - Left-sided chest wall pain is noncardiac origin in the setting of referred pain from left-sided abdominal wall hematoma.   COPD without acute exacerbation - Initially patient required nonrebreather due to shortness of breath currently maintaining O2 sat over 95% on 2 L oxygen .  Physical exam no  evidence of wheezing shortness of breath.  Patient is still complaining about cough. - Continue DuoNeb as needed and Dulera  twice daily.   Essential hypertension -Holding home blood pressure regimen in the setting of hypotension.   CKD stage IV -CMP showed creatinine 2.42 and GFR 20 upon arrival which is at her baseline.  Creatinine actually has better than baseline  06/08/2023: Last BMP revealed BUN of 23, creatinine of 1.93 with EGFR of 27 mL/min per 1.73 m.  Continue to monitor closely.    Generalized anxiety disorder -Continue Atarax  as needed.  Resumed Cymbalta .   Gout -Continue allopurinol    Hyperlipidemia - Continue Lipitor   History of breast cancer History of MGUS -Waiting for pharmacy verification if patient has been taking anastrozole .   Insulin -dependent DM type II - A1c 8.5 checked in 05/2023.  Currently on SSI.  Labile blood sugar but mostly controlled.  Will continue current management.   Generalized weakness - Seen by PT OT, need to resume home health on discharge to continue the home PT and OT.  DVT prophylaxis: Place and maintain sequential compression device Start: 06/05/23 0736 SCDs Start: 06/04/23 0043 Place TED hose Start: 06/04/23 0043   Code Status: Full Code  Family Communication:  None present at bedside.  Plan of care discussed with patient in length and he/she verbalized understanding and agreed with it.  Status is: Inpatient Remains inpatient appropriate because: Patient is still borderline hypotensive with hyperkalemia.  Cosyntropin  stimulation test is pending.  Estimated body mass index is 28.81 kg/m as calculated from the following:   Height as of this encounter: 4\' 11"  (1.499 m).   Weight as of this encounter: 64.7 kg.    Nutritional Assessment: Body mass index is 28.81 kg/m.Aaron Aas Seen by dietician.  I agree with the assessment and plan as outlined below: Nutrition Status:        . Skin Assessment: I have examined the patient's skin  and I agree with the wound assessment as performed by the wound care RN as outlined below:    Consultants:  None  Procedures:  None  Antimicrobials:  Anti-infectives (From admission, onward)    Start     Dose/Rate Route Frequency Ordered Stop   06/06/23 0500  vancomycin  (VANCOREADY) IVPB 750 mg/150 mL  Status:  Discontinued        750 mg 150 mL/hr over 60 Minutes Intravenous Every 48 hours 06/04/23 0313 06/05/23 1709   06/04/23 0100  ceFEPIme  (MAXIPIME ) 2 g in sodium chloride  0.9 % 100 mL IVPB  Status:  Discontinued        2 g 200 mL/hr over 30 Minutes Intravenous Every 24 hours 06/04/23 0057 06/05/23 1709   06/04/23 0100  vancomycin  (VANCOREADY) IVPB 1500 mg/300 mL        1,500 mg 150 mL/hr over 120 Minutes Intravenous  Once 06/04/23 0058 06/04/23 0346         Subjective: Patient continues to report pain.  Objective: Vitals:   06/07/23 1908 06/08/23 0255 06/08/23 0804 06/08/23 1127  BP: 136/78 118/75 (!) 92/59 114/73  Pulse: 68 78 88 73  Resp: 18 17 20 17   Temp: 98.4 F (36.9 C) 98.4 F (36.9 C) 98.9 F (37.2 C) 98.2 F (36.8 C)  TempSrc: Oral Oral Oral Oral  SpO2: 97% 100% 99% 96%  Weight:      Height:        Intake/Output Summary (Last 24 hours) at 06/08/2023 1602 Last data filed at 06/08/2023 1158 Gross per 24 hour  Intake 420 ml  Output 2050 ml  Net -1630 ml   Filed Weights   06/03/23 1936 06/04/23 1803  Weight: 64 kg 64.7 kg    Examination:  General exam: In painful distress. Cardiovascular system: S1 & S2  Gastrointestinal: Abdomen is wrapped. Central nervous system: Awake and alert.    Data Reviewed: I have personally reviewed following labs and imaging studies  CBC: Recent Labs  Lab 06/04/23 0110 06/04/23 1155 06/04/23 2226 06/05/23 0242 06/06/23 0930 06/07/23 0642  WBC 13.1* 10.8*  --  9.8 12.2* 9.9  NEUTROABS  --  7.3  --  7.1 9.5* 7.6  HGB 8.6* 9.5* 9.0* 9.3* 10.0* 9.6*  HCT 29.1* 31.1* 29.2* 29.9* 32.1* 30.7*  MCV 96.4 93.7  --   92.9 93.6 92.7  PLT 209 183  --  187 199 212   Basic Metabolic Panel: Recent Labs  Lab 06/04/23 0110 06/04/23 0958 06/05/23 0242 06/06/23 0930 06/07/23 0642  NA 134* 135 137 136 136  K 5.8* 4.9 5.4* 5.4* 5.5*  CL 102 102 108 104 102  CO2 21* 23 21* 24 25  GLUCOSE 151* 173* 193* 217* 98  BUN 47* 42* 31* 26* 23  CREATININE 2.25* 2.15* 1.97* 2.02* 1.93*  CALCIUM  8.9 9.0 8.6* 9.5 9.5  MG  --  1.8  --   --   --    GFR: Estimated Creatinine Clearance: 20.6 mL/min (A) (by C-G formula based on SCr of 1.93 mg/dL (H)). Liver Function  Tests: Recent Labs  Lab 06/03/23 1939 06/04/23 0110 06/04/23 0958  AST 26 20 21   ALT 27 24 24   ALKPHOS 84 78 68  BILITOT 0.9 1.0 1.0  PROT 6.9 6.1* 5.9*  ALBUMIN  3.2* 2.9* 2.7*   No results for input(s): "LIPASE", "AMYLASE" in the last 168 hours. No results for input(s): "AMMONIA" in the last 168 hours. Coagulation Profile: No results for input(s): "INR", "PROTIME" in the last 168 hours. Cardiac Enzymes: No results for input(s): "CKTOTAL", "CKMB", "CKMBINDEX", "TROPONINI" in the last 168 hours. BNP (last 3 results) No results for input(s): "PROBNP" in the last 8760 hours. HbA1C: No results for input(s): "HGBA1C" in the last 72 hours. CBG: Recent Labs  Lab 06/07/23 1226 06/07/23 1625 06/07/23 2113 06/08/23 0632 06/08/23 1121  GLUCAP 246* 148* 121* 104* 151*   Lipid Profile: No results for input(s): "CHOL", "HDL", "LDLCALC", "TRIG", "CHOLHDL", "LDLDIRECT" in the last 72 hours. Thyroid  Function Tests: No results for input(s): "TSH", "T4TOTAL", "FREET4", "T3FREE", "THYROIDAB" in the last 72 hours.  Anemia Panel: No results for input(s): "VITAMINB12", "FOLATE", "FERRITIN", "TIBC", "IRON ", "RETICCTPCT" in the last 72 hours. Sepsis Labs: Recent Labs  Lab 06/04/23 0121 06/04/23 1100 06/04/23 1555 06/05/23 0754 06/05/23 1344  PROCALCITON  --   --   --   --  <0.10  LATICACIDVEN 1.4 4.3* 1.9 1.1  --     Recent Results (from the past  240 hours)  Respiratory (~20 pathogens) panel by PCR     Status: None   Collection Time: 06/04/23  1:01 AM   Specimen: Nasopharyngeal Swab; Respiratory  Result Value Ref Range Status   Adenovirus NOT DETECTED NOT DETECTED Final   Coronavirus 229E NOT DETECTED NOT DETECTED Final    Comment: (NOTE) The Coronavirus on the Respiratory Panel, DOES NOT test for the novel  Coronavirus (2019 nCoV)    Coronavirus HKU1 NOT DETECTED NOT DETECTED Final   Coronavirus NL63 NOT DETECTED NOT DETECTED Final   Coronavirus OC43 NOT DETECTED NOT DETECTED Final   Metapneumovirus NOT DETECTED NOT DETECTED Final   Rhinovirus / Enterovirus NOT DETECTED NOT DETECTED Final   Influenza A NOT DETECTED NOT DETECTED Final   Influenza B NOT DETECTED NOT DETECTED Final   Parainfluenza Virus 1 NOT DETECTED NOT DETECTED Final   Parainfluenza Virus 2 NOT DETECTED NOT DETECTED Final   Parainfluenza Virus 3 NOT DETECTED NOT DETECTED Final   Parainfluenza Virus 4 NOT DETECTED NOT DETECTED Final   Respiratory Syncytial Virus NOT DETECTED NOT DETECTED Final   Bordetella pertussis NOT DETECTED NOT DETECTED Final   Bordetella Parapertussis NOT DETECTED NOT DETECTED Final   Chlamydophila pneumoniae NOT DETECTED NOT DETECTED Final   Mycoplasma pneumoniae NOT DETECTED NOT DETECTED Final    Comment: Performed at [facility], [address], [place], [place] [address]  Culture, blood (routine x 2) Call MD if unable to obtain prior to antibiotics being given     Status: None (Preliminary result)   Collection Time: 06/04/23  1:10 AM   Specimen: BLOOD LEFT FOREARM  Result Value Ref Range Status   Specimen Description BLOOD LEFT FOREARM  Final   Special Requests   Final    BOTTLES DRAWN AEROBIC ONLY Blood Culture results may not be optimal due to an inadequate volume of blood received in culture bottles   Culture   Final    NO GROWTH 4 DAYS Performed at [facility], [address], [place], [place] [address]     Report Status  PENDING  Incomplete  Culture, blood (routine x 2) Call MD if unable to obtain prior to antibiotics being given     Status: None (Preliminary result)   Collection Time: 06/04/23 11:00 AM   Specimen: BLOOD  Result Value Ref Range Status   Specimen Description BLOOD LEFT ANTECUBITAL  Final   Special Requests   Final    BOTTLES DRAWN AEROBIC AND ANAEROBIC Blood Culture results may not be optimal due to an inadequate volume of blood received in culture bottles   Culture   Final    NO GROWTH 4 DAYS Performed at [facility], [address], [place], [place] [address]    Report Status PENDING  Incomplete     Radiology Studies: No results found.   Scheduled Meds:  allopurinol   100 mg Oral Daily   atorvastatin   20 mg Oral Daily   docusate sodium   100 mg Oral BID   DULoxetine   30 mg Oral Daily   ferrous sulfate   325 mg Oral BID WC   fluticasone   2 spray Each Nare Daily   hydrOXYzine   50 mg Oral QHS   insulin  aspart  0-5 Units Subcutaneous QHS   insulin  aspart  0-6 Units Subcutaneous TID WC   midodrine   5 mg Oral TID WC   mometasone -formoterol   2 puff Inhalation BID   pantoprazole   40 mg Oral Daily   polyethylene glycol  17 g Oral Daily   sodium chloride  flush  3 mL Intravenous Q12H   sodium chloride  flush  3 mL Intravenous Q12H   sodium zirconium cyclosilicate   10 g Oral Daily   Continuous Infusions:     LOS: 4 days   Doroteo Gasmen, MD [facility]  06/08/2023, 4:02 PM  Total time spent: 50 minutes  *Please note that this is a verbal dictation therefore any spelling or grammatical errors are due to the "[employer]" system interpretation.  Please page via Amion and do not message via secure chat for urgent patient care matters. Secure chat can be used for non urgent patient care matters.  How to contact the [facility] Attending or Consulting provider 7A - 7P or covering provider during after hours 7P -7A, for this patient?  Check the care team  in [facility] and look for a) attending/consulting [facility] provider listed and b) the [facility] team listed. Page or secure chat 7A-7P. Log into [email or web address] and use [place]'s universal password to access. If you do not have the password, please contact the hospital operator. Locate the [facility] provider you are looking for under [facility] and page to a number that you can be directly reached. If you still have difficulty reaching the provider, please page the [facility] (Director on Call) for the Hospitalists listed on amion for assistance.

## 2023-06-08 NOTE — Progress Notes (Signed)
 Mobility Specialist Progress Note:    06/08/23 1020  Mobility  Activity Transferred from bed to chair  Level of Assistance Minimal assist, patient does 75% or more  Assistive Device Front wheel walker  Distance Ambulated (ft) 5 ft  Activity Response Tolerated well  Mobility Referral Yes  Mobility visit 1 Mobility  Mobility Specialist Start Time (ACUTE ONLY) 1006  Mobility Specialist Stop Time (ACUTE ONLY) 1018  Mobility Specialist Time Calculation (min) (ACUTE ONLY) 12 min   Pt received in bed asleep but easily woken, agreeable to mobility. Pt needed MinA to get to EOB and for STS. Contact guard during ambulation d/t pt being very shakey. Left in chair w/ call bell and personal belongings in reach. All needs met.  Inetta Manes Mobility Specialist  Please contact vis Secure Chat or  Rehab Office [phone]

## 2023-06-08 NOTE — Plan of Care (Signed)
   Problem: Education: Goal: Ability to describe self-care measures that may prevent or decrease complications (Diabetes Survival Skills Education) will improve Outcome: Progressing Goal: Individualized Educational Video(s) Outcome: Progressing

## 2023-06-09 DIAGNOSIS — J449 Chronic obstructive pulmonary disease, unspecified: Secondary | ICD-10-CM | POA: Diagnosis not present

## 2023-06-09 DIAGNOSIS — S301XXD Contusion of abdominal wall, subsequent encounter: Secondary | ICD-10-CM | POA: Diagnosis not present

## 2023-06-09 DIAGNOSIS — I10 Essential (primary) hypertension: Secondary | ICD-10-CM | POA: Diagnosis not present

## 2023-06-09 DIAGNOSIS — F411 Generalized anxiety disorder: Secondary | ICD-10-CM | POA: Diagnosis not present

## 2023-06-09 LAB — BASIC METABOLIC PANEL WITH GFR
Anion gap: 7 (ref 5–15)
BUN: 29 mg/dL — ABNORMAL HIGH (ref 8–23)
CO2: 25 mmol/L (ref 22–32)
Calcium: 9.6 mg/dL (ref 8.9–10.3)
Chloride: 104 mmol/L (ref 98–111)
Creatinine, Ser: 2.26 mg/dL — ABNORMAL HIGH (ref 0.44–1.00)
GFR, Estimated: 22 mL/min — ABNORMAL LOW (ref >60)
Glucose, Bld: 126 mg/dL — ABNORMAL HIGH (ref 70–99)
Potassium: 5.5 mmol/L — ABNORMAL HIGH (ref 3.5–5.1)
Sodium: 136 mmol/L (ref 135–145)

## 2023-06-09 LAB — RENAL FUNCTION PANEL
Albumin: 3.1 g/dL — ABNORMAL LOW (ref 3.5–5.0)
Albumin: 3.1 g/dL — ABNORMAL LOW (ref 3.5–5.0)
Albumin: 3 g/dL — ABNORMAL LOW (ref 3.5–5.0)
Anion gap: 7 (ref 5–15)
Anion gap: 9 (ref 5–15)
Anion gap: 9 (ref 5–15)
BUN: 28 mg/dL — ABNORMAL HIGH (ref 8–23)
BUN: 28 mg/dL — ABNORMAL HIGH (ref 8–23)
BUN: 28 mg/dL — ABNORMAL HIGH (ref 8–23)
CO2: 24 mmol/L (ref 22–32)
CO2: 25 mmol/L (ref 22–32)
CO2: 24 mmol/L (ref 22–32)
Calcium: 9.5 mg/dL (ref 8.9–10.3)
Calcium: 9.6 mg/dL (ref 8.9–10.3)
Calcium: 9.5 mg/dL (ref 8.9–10.3)
Chloride: 103 mmol/L (ref 98–111)
Chloride: 104 mmol/L (ref 98–111)
Chloride: 102 mmol/L (ref 98–111)
Creatinine, Ser: 2.18 mg/dL — ABNORMAL HIGH (ref 0.44–1.00)
Creatinine, Ser: 2.19 mg/dL — ABNORMAL HIGH (ref 0.44–1.00)
Creatinine, Ser: 2.26 mg/dL — ABNORMAL HIGH (ref 0.44–1.00)
GFR, Estimated: 23 mL/min — ABNORMAL LOW (ref >60)
GFR, Estimated: 23 mL/min — ABNORMAL LOW (ref >60)
GFR, Estimated: 22 mL/min — ABNORMAL LOW (ref >60)
Glucose, Bld: 125 mg/dL — ABNORMAL HIGH (ref 70–99)
Glucose, Bld: 125 mg/dL — ABNORMAL HIGH (ref 70–99)
Glucose, Bld: 280 mg/dL — ABNORMAL HIGH (ref 70–99)
Phosphorus: 3.9 mg/dL (ref 2.5–4.6)
Phosphorus: 4.1 mg/dL (ref 2.5–4.6)
Phosphorus: 3.7 mg/dL (ref 2.5–4.6)
Potassium: 5.5 mmol/L — ABNORMAL HIGH (ref 3.5–5.1)
Potassium: 5.6 mmol/L — ABNORMAL HIGH (ref 3.5–5.1)
Potassium: 5 mmol/L (ref 3.5–5.1)
Sodium: 135 mmol/L (ref 135–145)
Sodium: 137 mmol/L (ref 135–145)
Sodium: 135 mmol/L (ref 135–145)

## 2023-06-09 LAB — GLUCOSE, CAPILLARY
Glucose-Capillary: 128 mg/dL — ABNORMAL HIGH (ref 70–99)
Glucose-Capillary: 142 mg/dL — ABNORMAL HIGH (ref 70–99)
Glucose-Capillary: 100 mg/dL — ABNORMAL HIGH (ref 70–99)
Glucose-Capillary: 186 mg/dL — ABNORMAL HIGH (ref 70–99)

## 2023-06-09 LAB — CULTURE, BLOOD (ROUTINE X 2)
Culture: NO GROWTH
Culture: NO GROWTH

## 2023-06-09 MED ORDER — SENNOSIDES-DOCUSATE SODIUM 8.6-50 MG PO TABS
2.0000 | ORAL_TABLET | Freq: Two times a day (BID) | ORAL | Status: DC
  Administered 2023-06-09 – 2023-06-11 (×5): 2 via ORAL
  Filled 2023-06-09 (×6): qty 2

## 2023-06-09 MED ORDER — SODIUM ZIRCONIUM CYCLOSILICATE 10 G PO PACK
10.0000 g | PACK | Freq: Two times a day (BID) | ORAL | Status: AC
Start: 2023-06-09 — End: 2023-06-11
  Administered 2023-06-09 – 2023-06-10 (×4): 10 g via ORAL
  Filled 2023-06-09 (×4): qty 1

## 2023-06-09 MED ORDER — BISACODYL 10 MG RE SUPP
10.0000 mg | Freq: Once | RECTAL | Status: AC
  Administered 2023-06-09: 10 mg via RECTAL
  Filled 2023-06-09: qty 1

## 2023-06-09 MED ORDER — BISACODYL 5 MG PO TBEC
10.0000 mg | DELAYED_RELEASE_TABLET | Freq: Once | ORAL | Status: DC
Start: 2023-06-09 — End: 2023-06-09

## 2023-06-09 NOTE — Plan of Care (Signed)
   Problem: Education: Goal: Ability to describe self-care measures that may prevent or decrease complications (Diabetes Survival Skills Education) will improve Outcome: Progressing Goal: Individualized Educational Video(s) Outcome: Progressing

## 2023-06-09 NOTE — Progress Notes (Signed)
 [facility]                                                                               Kalyana Fluhr, is a 73 y.o. female, DOB - [birth year], ZOX:[id number] Admit date - 06/03/2023    Outpatient Primary MD for the patient is [facility]  LOS - 5  days    Brief summary   JOEL YORK is a 73 y.o. female with medical history significant of history of breast cancer, MGUS, COPD, insulin -dependent DM type II, essential hypertension, anxiety, gout, hyperlipidemia, gerd, CKD stage IV, gout, and generalized weakness presented to the hospital with complaining of left-sided chest pain and generalized abdominal pain.   Patient was admitted from 4/18 to 4/27 acute hypoxic respiratory failure and sepsis  in the setting of community-acquired pneumonia and COPD exacerbation.  Patient required Levophed  drip in the setting of hypotension in the context of sepsis, required BiPAP completed course of IV ceftriaxone  azithromycin  for 7 days.  Blood culture from 4/13 no growth so far.  Patient was discharged to home with home PT OT management.   Assessment & Plan    Assessment and Plan:    Abdominal wall hematoma:  Chest x-ray showed left lower lobe pneumonia/consolidation. - CT abdomen pelvis showed Extensive intramuscular hematoma within the left anterolateral abdominal wall, measuring up to 3.7 cm within the rectus sheath. . Small bilateral fat containing inguinal hernias. -In the setting of persistent hypotension and abdominal wall hematoma , she underwent 1 unit of prbc transfusion.  Avoiding pharmacological DVT prophylaxis and aspirin .  Pain control.    Hyperkalemia On lokelma     Constipation Dulcolax suppository ordered. Continue with colace and senna.    Sepsis secondary to left lower lobe pneumonia  end-stage COPD and recent hospitalization for COPD exacerbation on the verge of requiring intubation however patient declined and was agreeable to treat  with BiPA and choose to be remained full code though.  As of now patient is still full code.  She was treated with ceftriaxone  azithromycin .  Blood culture was negative.   Hypertension Well controlled.  On midodrine  TIDA.   Hyperlipidemia Continue with lipitor.    COPD Stable, no wheezing heard.   Stage 4 CKD Creatinine at 2.26.  Monitor.    Iron  deficiency anemia Hemoglobin around 9.6, recheck labs in am.     Estimated body mass index is 28.81 kg/m as calculated from the following:   Height as of this encounter: 4\' 11"  (1.499 m).   Weight as of this encounter: 64.7 kg.  Code Status:  full code.  DVT Prophylaxis:  Place and maintain sequential compression device Start: 06/05/23 0736 SCDs Start: 06/04/23 0043 Place TED hose Start: 06/04/23 0043   Level of Care: Level of care: Progressive Family Communication: none at bedside.   Disposition Plan:     Remains inpatient appropriate:  pending normalization of potassium.   Procedures:  None.   Consultants:   None.  Antimicrobials:   Anti-infectives (From admission, onward)    Start     Dose/Rate Route Frequency Ordered Stop   06/06/23 0500  vancomycin  (VANCOREADY) IVPB 750 mg/150 mL  Status:  Discontinued  750 mg 150 mL/hr over 60 Minutes Intravenous Every 48 hours 06/04/23 0313 06/05/23 1709   06/04/23 0100  ceFEPIme  (MAXIPIME ) 2 g in sodium chloride  0.9 % 100 mL IVPB  Status:  Discontinued        2 g 200 mL/hr over 30 Minutes Intravenous Every 24 hours 06/04/23 0057 06/05/23 1709   06/04/23 0100  vancomycin  (VANCOREADY) IVPB 1500 mg/300 mL        1,500 mg 150 mL/hr over 120 Minutes Intravenous  Once 06/04/23 0058 06/04/23 0346        Medications  Scheduled Meds:  allopurinol   100 mg Oral Daily   atorvastatin   20 mg Oral Daily   bisacodyl   10 mg Rectal Once   docusate sodium   100 mg Oral BID   DULoxetine   30 mg Oral Daily   ferrous sulfate   325 mg Oral BID WC   fluticasone   2 spray Each Nare  Daily   hydrOXYzine   50 mg Oral QHS   insulin  aspart  0-5 Units Subcutaneous QHS   insulin  aspart  0-6 Units Subcutaneous TID WC   midodrine   5 mg Oral TID WC   mometasone -formoterol   2 puff Inhalation BID   pantoprazole   40 mg Oral Daily   polyethylene glycol  17 g Oral Daily   sodium chloride  flush  3 mL Intravenous Q12H   sodium chloride  flush  3 mL Intravenous Q12H   sodium zirconium cyclosilicate   10 g Oral BID   Continuous Infusions: PRN Meds:.acetaminophen  **OR** acetaminophen , HYDROmorphone  (DILAUDID ) injection, ipratropium-albuterol , ondansetron  **OR** ondansetron  (ZOFRAN ) IV, oxyCODONE  **OR** oxyCODONE , sodium chloride  flush    Subjective:   Albirtha Strollo was seen and examined today.  No BM.   Objective:   Vitals:   06/09/23 0017 06/09/23 0300 06/09/23 0703 06/09/23 1048  BP: 115/81 125/76 138/74 103/62  Pulse: 73 70 71   Resp: 15 13 17    Temp: 98.1 F (36.7 C) 99 F (37.2 C) 98.2 F (36.8 C) 98.3 F (36.8 C)  TempSrc: Oral Oral Oral Oral  SpO2: 94% 96% 95%   Weight:      Height:        Intake/Output Summary (Last 24 hours) at 06/09/2023 1711 Last data filed at 06/09/2023 1234 Gross per 24 hour  Intake 980 ml  Output 1150 ml  Net -170 ml   Filed Weights   06/03/23 1936 06/04/23 1803  Weight: 64 kg 64.7 kg     Exam General: Alert and oriented x 3, NAD Cardiovascular: S1 S2 auscultated, no murmurs, RRR Respiratory: Clear to auscultation bilaterally, no wheezing, rales or rhonchi Gastrointestinal: Soft, nontender, nondistended, + bowel sounds Ext: no pedal edema bilaterally Neuro: AAOx3, Cr N's II- XII. Strength 5/5 upper and lower extremities bilaterally Skin: No rashes Psych: Normal affect and demeanor, alert and oriented x3    Data Reviewed:  I have personally reviewed following labs and imaging studies   CBC Lab Results  Component Value Date   WBC 9.9 06/07/2023   RBC 3.31 (L) 06/07/2023   HGB 9.6 (L) 06/07/2023   HCT 30.7 (L) 06/07/2023    MCV 92.7 06/07/2023   MCH 29.0 06/07/2023   PLT 212 06/07/2023   MCHC 31.3 06/07/2023   RDW 16.0 (H) 06/07/2023   LYMPHSABS 1.0 06/07/2023   MONOABS 0.9 06/07/2023   EOSABS 0.2 06/07/2023   BASOSABS 0.0 06/07/2023     Last metabolic panel Lab Results  Component Value Date   NA 135 06/09/2023   K 5.0 06/09/2023  CL 102 06/09/2023   CO2 24 06/09/2023   BUN 28 (H) 06/09/2023   CREATININE 2.26 (H) 06/09/2023   GLUCOSE 280 (H) 06/09/2023   GFRNONAA 22 (L) 06/09/2023   GFRAA 36 (L) 03/15/2020   CALCIUM  9.5 06/09/2023   PHOS 3.7 06/09/2023   PROT 5.9 (L) 06/04/2023   ALBUMIN  3.0 (L) 06/09/2023   LABGLOB 3.9 04/22/2023   AGRATIO 1.3 05/10/2021   BILITOT 1.0 06/04/2023   ALKPHOS 68 06/04/2023   AST 21 06/04/2023   ALT 24 06/04/2023   ANIONGAP 9 06/09/2023    CBG (last 3)  Recent Labs    06/09/23 0616 06/09/23 1102 06/09/23 1635  GLUCAP 128* 142* 100*      Coagulation Profile: No results for input(s): "INR", "PROTIME" in the last 168 hours.   Radiology Studies: No results found.     Feliciana Horn M.D. [facility] 06/09/2023, 5:11 PM  Available via Epic secure chat 7am-7pm After 7 pm, please refer to night coverage provider listed on amion.

## 2023-06-10 DIAGNOSIS — E119 Type 2 diabetes mellitus without complications: Secondary | ICD-10-CM | POA: Diagnosis not present

## 2023-06-10 DIAGNOSIS — S301XXD Contusion of abdominal wall, subsequent encounter: Secondary | ICD-10-CM | POA: Diagnosis not present

## 2023-06-10 DIAGNOSIS — I10 Essential (primary) hypertension: Secondary | ICD-10-CM | POA: Diagnosis not present

## 2023-06-10 DIAGNOSIS — J449 Chronic obstructive pulmonary disease, unspecified: Secondary | ICD-10-CM | POA: Diagnosis not present

## 2023-06-10 LAB — GLUCOSE, CAPILLARY
Glucose-Capillary: 124 mg/dL — ABNORMAL HIGH (ref 70–99)
Glucose-Capillary: 143 mg/dL — ABNORMAL HIGH (ref 70–99)
Glucose-Capillary: 104 mg/dL — ABNORMAL HIGH (ref 70–99)
Glucose-Capillary: 158 mg/dL — ABNORMAL HIGH (ref 70–99)

## 2023-06-10 LAB — CBC WITH DIFFERENTIAL/PLATELET
Abs Immature Granulocytes: 0.02 10*3/uL (ref 0.00–0.07)
Basophils Absolute: 0 10*3/uL (ref 0.0–0.1)
Basophils Relative: 0 %
Eosinophils Absolute: 0.2 10*3/uL (ref 0.0–0.5)
Eosinophils Relative: 4 %
HCT: 32.3 % — ABNORMAL LOW (ref 36.0–46.0)
Hemoglobin: 10.2 g/dL — ABNORMAL LOW (ref 12.0–15.0)
Immature Granulocytes: 0 %
Lymphocytes Relative: 17 %
Lymphs Abs: 1 10*3/uL (ref 0.7–4.0)
MCH: 28.6 pg (ref 26.0–34.0)
MCHC: 31.6 g/dL (ref 30.0–36.0)
MCV: 90.5 fL (ref 80.0–100.0)
Monocytes Absolute: 0.6 10*3/uL (ref 0.1–1.0)
Monocytes Relative: 9 %
Neutro Abs: 4.1 10*3/uL (ref 1.7–7.7)
Neutrophils Relative %: 70 %
Platelets: 245 10*3/uL (ref 150–400)
RBC: 3.57 MIL/uL — ABNORMAL LOW (ref 3.87–5.11)
RDW: 15.7 % — ABNORMAL HIGH (ref 11.5–15.5)
WBC: 5.9 10*3/uL (ref 4.0–10.5)
nRBC: 0 % (ref 0.0–0.2)

## 2023-06-10 LAB — BASIC METABOLIC PANEL WITH GFR
Anion gap: 11 (ref 5–15)
BUN: 31 mg/dL — ABNORMAL HIGH (ref 8–23)
CO2: 23 mmol/L (ref 22–32)
Calcium: 9.9 mg/dL (ref 8.9–10.3)
Chloride: 104 mmol/L (ref 98–111)
Creatinine, Ser: 2.35 mg/dL — ABNORMAL HIGH (ref 0.44–1.00)
GFR, Estimated: 21 mL/min — ABNORMAL LOW (ref >60)
Glucose, Bld: 127 mg/dL — ABNORMAL HIGH (ref 70–99)
Potassium: 4.7 mmol/L (ref 3.5–5.1)
Sodium: 138 mmol/L (ref 135–145)

## 2023-06-10 MED ORDER — MIDODRINE HCL 5 MG PO TABS: 5.0000 mg | ORAL_TABLET | Freq: Three times a day (TID) | ORAL | 1 refills | Status: DC

## 2023-06-10 NOTE — Progress Notes (Signed)
 Physical Therapy Treatment Patient Details Name: [name] MRN: [id number] DOB: Apr 28, 1950 Today's Date: 06/10/2023   History of Present Illness 73 y.o. female [facility] 06/03/23 with L sided chest pain and generalized abdominal pain. Pt with abdominal wall hematoma, sepsis 2/2 LLL PNA. 4/17-4/27 admission for acute resp failure and sepsis 2/2 CAP and COPD exacerbation. PMH - breast CA, copd, cad, DM, HTN, ckd, lt shoulder replacement, anxiety, gout    PT Comments  Pt's pain better managed this date and was able to amb 100' with RW and contact guard assist and minA during turns. Pt with good home support and set up. Will need 24/7 assist initially in addition to home health services and a hospital bed. Acute PT to cont to follow.    If plan is discharge home, recommend the following: A lot of help with bathing/dressing/bathroom;Assist for transportation;Help with stairs or ramp for entrance;Assistance with cooking/housework;A lot of help with walking and/or transfers   Can travel by private vehicle        Equipment Recommendations  Hospital bed    Recommendations for Other Services       Precautions / Restrictions Precautions Precautions: Fall Recall of Precautions/Restrictions: Impaired Precaution/Restrictions Comments: abdominal hematoma Required Braces or Orthoses: Other Brace Other Brace: abdominal binder, ordered 5/1 Restrictions Weight Bearing Restrictions Per Provider Order: No     Mobility  Bed Mobility               General bed mobility comments: pt received sitting EOB    Transfers Overall transfer level: Needs assistance Equipment used: Rolling walker (2 wheels) Transfers: Sit to/from Stand Sit to Stand: Min assist           General transfer comment: minA to power up from EOB, increased time, tactile cues for anterior weight shift to power up through LEs    Ambulation/Gait Ambulation/Gait assistance: Min assist Gait Distance (Feet): 100  Feet Assistive device: Rolling walker (2 wheels) Gait Pattern/deviations: Step-through pattern, Decreased stride length, Shuffle, Narrow base of support Gait velocity: dec Gait velocity interpretation: <1.31 ft/sec, indicative of household ambulator   General Gait Details: Low foot clearance, short step length, mostly CGA for forward stepping, but needing up to minA when turning, decreased pace   Optometrist     Tilt Bed    Modified Rankin (Stroke Patients Only)       Balance Overall balance assessment: Needs assistance Sitting-balance support: No upper extremity supported, Feet supported Sitting balance-Leahy Scale: Fair Sitting balance - Comments: static sitting with supervision, dynamic sitting requiring close contact guard to minA   Standing balance support: Bilateral upper extremity supported, During functional activity, Reliant on assistive device for balance Standing balance-Leahy Scale: Poor Standing balance comment: fair static standing but poor dynamic standing even with AD                            Communication Communication Communication: Impaired Factors Affecting Communication: Hearing impaired  Cognition Arousal: Alert Behavior During Therapy: WFL for tasks assessed/performed   PT - Cognitive impairments: No apparent impairments                       PT - Cognition Comments: delayed processing but able Following commands: Intact      Cueing Cueing Techniques: Verbal cues, Tactile cues, Gestural cues  Exercises  General Comments General comments (skin integrity, edema, etc.): VSS      Pertinent Vitals/Pain Pain Assessment Pain Assessment: Faces Faces Pain Scale: Hurts a little bit Pain Location: abdomen Pain Descriptors / Indicators: Discomfort, Grimacing, Guarding, Sharp    Home Living                          Prior Function            PT Goals (current goals can  now be found in the care plan section) Acute Rehab PT Goals PT Goal Formulation: With patient Time For Goal Achievement: 06/20/23 Potential to Achieve Goals: Fair Progress towards PT goals: Progressing toward goals    Frequency    Min 2X/week      PT Plan      Co-evaluation              AM-PAC PT "6 Clicks" Mobility   Outcome Measure  Help needed turning from your back to your side while in a flat bed without using bedrails?: A Little Help needed moving from lying on your back to sitting on the side of a flat bed without using bedrails?: A Little Help needed moving to and from a bed to a chair (including a wheelchair)?: A Little Help needed standing up from a chair using your arms (e.g., wheelchair or bedside chair)?: A Little Help needed to walk in hospital room?: A Little Help needed climbing 3-5 steps with a railing? : A Lot 6 Click Score: 17    End of Session   Activity Tolerance: Patient tolerated treatment well Patient left: with call bell/phone within reach;in chair;with bed alarm set Nurse Communication: Mobility status;Other (comment) (need for abdominal binder) PT Visit Diagnosis: Unsteadiness on feet (R26.81);Other abnormalities of gait and mobility (R26.89);Muscle weakness (generalized) (M62.81);Pain     Time: 0981-1914 PT Time Calculation (min) (ACUTE ONLY): 14 min  Charges:    $Gait Training: 8-22 mins PT General Charges $$ ACUTE PT VISIT: 1 Visit                     Renaee Caro, PT, DPT Acute Rehabilitation Services Secure chat preferred Office #: [phone]    Jenna Moan 06/10/2023, 9:09 PM

## 2023-06-10 NOTE — TOC Progression Note (Signed)
 Transition of Care [facility]) - Progression Note    Patient Details  Name: [name] MRN: [id number] Date of Birth: 11-27-1950  [facility] CM/SW Contact  Jannine Meo, RN Phone Number: 06/10/2023, 12:52 PM  Clinical Narrative:   Patient discussed in progression rounds, still working on pain control. Potassium WNL.    Expected Discharge Plan: Home w Home Health Services Barriers to Discharge: Continued Medical Work up  Expected Discharge Plan and Services     Post Acute Care Choice: Durable Medical Equipment Living arrangements for the past 2 months: Single Family Home                           HH Arranged: PT, OT HH Agency: [facility] Date HH Agency Contacted: 06/06/23 Time HH Agency Contacted: 1608 Representative spoke with at [facility] Agency: Renetta Carter   Social Determinants of Health (SDOH) Interventions SDOH Screenings   Food Insecurity: Food Insecurity Present (06/04/2023)  Housing: Low Risk  (06/04/2023)  Transportation Needs: No Transportation Needs (06/04/2023)  Utilities: Not At Risk (06/04/2023)  Depression (PHQ2-9): Medium Risk (09/22/2021)  Social Connections: Moderately Isolated (06/04/2023)  Tobacco Use: Medium Risk (06/04/2023)    Readmission Risk Interventions    05/29/2023    4:13 PM  Readmission Risk Prevention Plan  Transportation Screening Complete  Medication Review (RN Care Manager) Complete  PCP or Specialist appointment within 3-5 days of discharge Complete  HRI or Home Care Consult Complete  SW Recovery Care/Counseling Consult Complete  Palliative Care Screening Complete  Skilled Nursing Facility Not Applicable

## 2023-06-10 NOTE — Progress Notes (Signed)
 Went into patients room to go over AVS paperwork with patient and patient states that she is supposed to have a medical bed delivered to her home. Looking back into notes from 5/1, it states that patient is requiring a bed but I can't seem to find any updates since then. Case worker in the ED has been called with no answer and a secure chat message has been sent as well.

## 2023-06-10 NOTE — Progress Notes (Addendum)
 The patient's discharge was delayed due to unsafe discharge.  The patient's hospital bed is not delivered and the patient cannot safely be discharged home without it.    TOC DME order has been placed to expedite the bed delivery or to find alternatives for appropriate arrangements to be made.  The discharge is delayed due to lack of necessary home equipment.  Presented at bedside.  The patient expresses that she is in tremendous amount of pain when she lays in her flat bed at home.  She has difficulty getting in and out of her bed.  Updated her sister via phone who expressed the same.  On exam, the patient is alert and answers to questions appropriately.  She has an binder wrapped around her abdomen.    Her Heart rate is regular rhythm and rate Lungs are clear to auscultation. Mood is appropriate for condition and setting.  Time:  15 mins

## 2023-06-10 NOTE — Discharge Summary (Signed)
 Physician Discharge Summary   Patient: [name] MRN: [id number] DOB: 09/26/1950  Admit date:     06/03/2023  Discharge date: 06/10/23  Discharge Physician: Feliciana Horn   PCP: [facility]   Recommendations at discharge:  Please follow up with PCP in one week.   Discharge Diagnoses: Principal Problem:   Abdominal wall hematoma Active Problems:   Insulin  dependent type 2 diabetes mellitus (HCC)   COPD (chronic obstructive pulmonary disease) (HCC)   Left lower lobe pneumonia   Essential hypertension   MGUS (monoclonal gammopathy of unknown significance)   CKD (chronic kidney disease) stage 4, GFR 15-29 ml/min (HCC)   Generalized anxiety disorder   History of gout   Generalized weakness   History of breast cancer   Gout   Hematoma    Hospital Course:  [name] is a 73 y.o. female with medical history significant of history of breast cancer, MGUS, COPD, insulin -dependent DM type II, essential hypertension, anxiety, gout, hyperlipidemia, gerd, CKD stage IV, gout, and generalized weakness presented to the hospital with complaining of left-sided chest pain and generalized abdominal pain.   Patient was admitted from 4/18 to 4/27 acute hypoxic respiratory failure and sepsis  in the setting of community-acquired pneumonia and COPD exacerbation.  Patient required Levophed  drip in the setting of hypotension in the context of sepsis, required BiPAP completed course of IV ceftriaxone  azithromycin  for 7 days.  Blood culture from 4/13 no growth so far.  Patient was discharged to home with home PT OT management.  Assessment and Plan:      Abdominal wall hematoma:  Chest x-ray showed left lower lobe pneumonia/consolidation. - CT abdomen pelvis showed Extensive intramuscular hematoma within the left anterolateral abdominal wall, measuring up to 3.7 cm within the rectus sheath. . Small bilateral fat containing inguinal hernias. -In the setting of persistent  hypotension and abdominal wall hematoma , she underwent 1 unit of prbc transfusion.  Avoiding pharmacological DVT prophylaxis and aspirin .  Pain control.      Hyperkalemia Resolved.        Constipation Dulcolax suppository ordered. Continue with colace and senna.      Sepsis secondary to left lower lobe pneumonia  end-stage COPD and recent hospitalization for COPD exacerbation on the verge of requiring intubation however patient declined and was agreeable to treat with BiPA and choose to be remained full code though.  As of now patient is still full code.  She was treated with ceftriaxone  and  azithromycin .  Blood culture was negative.     Hypertension Optimal.  On midodrine  TID.    Hyperlipidemia Continue with lipitor.      COPD Stable, no wheezing heard.    Stage 4 CKD Creatinine at 2.26.  Monitor.      Iron  deficiency anemia Hemoglobin stabilized.        Estimated body mass index is 28.81 kg/m as calculated from the following:   Height as of this encounter: 4\' 11"  (1.499 m).   Weight as of this encounter: 64.7 kg.   Consultants: PCCM Procedures performed: none.   Disposition: Home Diet recommendation:  Discharge Diet Orders (From admission, onward)     Start     Ordered   06/10/23 0000  Diet - low sodium heart healthy        06/10/23 0848   06/10/23 0000  Diet - low sodium heart healthy        06/10/23 1828  Regular diet DISCHARGE MEDICATION: Allergies as of 06/10/2023   No Known Allergies      Medication List     STOP taking these medications    aspirin  EC 81 MG tablet   Misc. Devices Misc       TAKE these medications    acetaminophen  650 MG CR tablet Commonly known as: TYLENOL  Take 650 mg by mouth every 8 (eight) hours as needed for pain.   albuterol  (2.5 MG/3ML) 0.083% nebulizer solution Commonly known as: PROVENTIL  Take 3 mLs (2.5 mg total) by nebulization every 6 (six) hours as needed for wheezing or shortness of  breath.   albuterol  108 (90 Base) MCG/ACT inhaler Commonly known as: VENTOLIN  HFA Inhale 1-2 puffs into the lungs every 6 (six) hours as needed for wheezing or shortness of breath.   allopurinol  100 MG tablet Commonly known as: ZYLOPRIM  Take 100 mg by mouth daily.   anastrozole  1 MG tablet Commonly known as: ARIMIDEX  TAKE ONE TABLET BY MOUTH ONCE DAILY   atorvastatin  20 MG tablet Commonly known as: LIPITOR Take 20 mg by mouth daily.   Blood Pressure Monitor Devi Please provide patient with insurance approved blood pressure monitor. I10.0   diclofenac  Sodium 1 % Gel Commonly known as: Voltaren  Apply 1 Application topically 4 (four) times daily. What changed: when to take this   DULoxetine  30 MG capsule Commonly known as: CYMBALTA  Take 30 mg by mouth daily.   Easy Touch Pen Needles 31G X 8 MM Misc Generic drug: Insulin  Pen Needle Use to inject insulin  once daily and Victoza  once daily. Max of 2 pen needles a day. Must have office visit for refills   feeding supplement (GLUCERNA SHAKE) Liqd Take 237 mLs by mouth 2 (two) times daily between meals.   ferrous sulfate  325 (65 FE) MG tablet Take 1 tablet (325 mg total) by mouth 2 (two) times daily with a meal.   fluticasone  50 MCG/ACT nasal spray Commonly known as: FLONASE  Place 2 sprays into both nostrils daily.   gabapentin  100 MG capsule Commonly known as: NEURONTIN  Take 100-200 mg by mouth See admin instructions. Take one tablet by mouth in the morning and afternoon, then take 2 tablets every night per patient   glipiZIDE  2.5 MG Tabs Take 2.5 mg by mouth daily before breakfast.   hydrOXYzine  25 MG tablet Commonly known as: ATARAX  Take 50 mg by mouth at bedtime.   methocarbamol  500 MG tablet Commonly known as: ROBAXIN  Take 1 tablet (500 mg total) by mouth 2 (two) times daily.   midodrine  5 MG tablet Commonly known as: PROAMATINE  Take 1 tablet (5 mg total) by mouth 3 (three) times daily with meals.   Nebulizer  Mask Adult Misc 1 Units by Does not apply route daily as needed. ICD 10 J44.9   nitroGLYCERIN  0.4 MG SL tablet Commonly known as: NITROSTAT  Place 1 tablet (0.4 mg total) under the tongue every 5 (five) minutes as needed for chest pain.   pantoprazole  40 MG tablet Commonly known as: PROTONIX  TAKE ONE TABLET BY MOUTH EVERY DAY FOR ACID REFLUX   polyethylene glycol 17 g packet Commonly known as: MIRALAX  / GLYCOLAX  Take 17 g by mouth daily.   senna 8.6 MG Tabs tablet Commonly known as: SENOKOT Take 1 tablet (8.6 mg total) by mouth daily as needed for mild constipation or moderate constipation.   Symbicort  160-4.5 MCG/ACT inhaler Generic drug: budesonide -formoterol  Inhale 2 puffs into the lungs in the morning and at bedtime.  Durable Medical Equipment  (From admission, onward)           Start     Ordered   06/06/23 1611  For home use only DME Hospital bed  Once       Question Answer Comment  Length of Need Lifetime   The above medical condition requires: Patient requires the ability to reposition frequently   Bed type Semi-electric   Support Surface: Gel Overlay      06/06/23 1610            Discharge Exam: Filed Weights   06/03/23 1936 06/04/23 1803  Weight: 64 kg 64.7 kg   General exam: Appears calm and comfortable  Respiratory system: Clear to auscultation. Respiratory effort normal. Cardiovascular system: S1 & S2 heard, RRR. No JVD,  Gastrointestinal system: Abdomen is nondistended, soft and nontender.  Central nervous system: Alert and oriented.  Extremities: Symmetric 5 x 5 power. Skin: No rashes,  Psychiatry:  Mood & affect appropriate.    Condition at discharge: fair  The results of significant diagnostics from this hospitalization (including imaging, microbiology, ancillary and laboratory) are listed below for reference.   Imaging Studies: ECHOCARDIOGRAM COMPLETE Result Date: 06/05/2023    ECHOCARDIOGRAM REPORT   Patient  Name:   LAWONDA LUBERTO Date of Exam: 06/05/2023 Medical Rec #:  [id number]      Height:       59.0 in Accession #:    [id number]     Weight:       142.6 lb Date of Birth:  [birth year]      BSA:          1.598 m Patient Age:    73 years       BP:           93/56 mmHg Patient Gender: F              HR:           71 bpm. Exam Location:  Inpatient Procedure: 2D Echo, Cardiac Doppler and Color Doppler (Both Spectral and Color            Flow Doppler were utilized during procedure). Indications:    Shock R57.9  History:        Patient has prior history of Echocardiogram examinations, most                 recent 11/04/2022. Angina, Previous Myocardial Infarction and                 CAD, COPD and ESRD, Signs/Symptoms:Hypotension, Chest Pain and                 Dyspnea; Risk Factors:Hypertension, Dyslipidemia and Diabetes.                 Hx of Breast cancer.  Sonographer:    Terrilee Few RCS Referring Phys: [id number] OMAR M ALBUSTAMI IMPRESSIONS  1. Left ventricular ejection fraction, by estimation, is 60 to 65%. The left ventricle has normal function. The left ventricle has no regional wall motion abnormalities. Left ventricular diastolic parameters are consistent with Grade I diastolic dysfunction (impaired relaxation).  2. Right ventricular systolic function is normal. The right ventricular size is normal.  3. The mitral valve is normal in structure. No evidence of mitral valve regurgitation. No evidence of mitral stenosis.  4. The aortic valve is tricuspid. Aortic valve regurgitation is moderate. No aortic stenosis is present. Aortic regurgitation PHT measures 422 msec.  5. The inferior  vena cava is normal in size with greater than 50% respiratory variability, suggesting right atrial pressure of 3 mmHg. FINDINGS  Left Ventricle: Left ventricular ejection fraction, by estimation, is 60 to 65%. The left ventricle has normal function. The left ventricle has no regional wall motion abnormalities. The left ventricular internal  cavity size was normal in size. There is  no left ventricular hypertrophy. Left ventricular diastolic parameters are consistent with Grade I diastolic dysfunction (impaired relaxation). Normal left ventricular filling pressure. Right Ventricle: The right ventricular size is normal. No increase in right ventricular wall thickness. Right ventricular systolic function is normal. Left Atrium: Left atrial size was normal in size. Right Atrium: Right atrial size was normal in size. Pericardium: There is no evidence of pericardial effusion. Mitral Valve: The mitral valve is normal in structure. No evidence of mitral valve regurgitation. No evidence of mitral valve stenosis. Tricuspid Valve: The tricuspid valve is normal in structure. Tricuspid valve regurgitation is not demonstrated. No evidence of tricuspid stenosis. Aortic Valve: The aortic valve is tricuspid. Aortic valve regurgitation is moderate. Aortic regurgitation PHT measures 422 msec. No aortic stenosis is present. Aortic valve peak gradient measures 6.6 mmHg. Pulmonic Valve: The pulmonic valve was normal in structure. Pulmonic valve regurgitation is not visualized. No evidence of pulmonic stenosis. Aorta: The aortic root is normal in size and structure. Venous: The inferior vena cava is normal in size with greater than 50% respiratory variability, suggesting right atrial pressure of 3 mmHg. IAS/Shunts: No atrial level shunt detected by color flow Doppler.  LEFT VENTRICLE PLAX 2D LVIDd:         4.50 cm   Diastology LVIDs:         2.90 cm   LV e' medial:    8.92 cm/s LV PW:         1.00 cm   LV E/e' medial:  9.0 LV IVS:        0.80 cm   LV e' lateral:   8.49 cm/s LVOT diam:     2.00 cm   LV E/e' lateral: 9.5 LV SV:         59 LV SV Index:   37 LVOT Area:     3.14 cm  RIGHT VENTRICLE             IVC RV S prime:     14.80 cm/s  IVC diam: 1.40 cm TAPSE (M-mode): 1.8 cm LEFT ATRIUM             Index        RIGHT ATRIUM          Index LA diam:        3.60 cm 2.25  cm/m   RA Area:     9.54 cm LA Vol (A2C):   29.4 ml 18.37 ml/m  RA Volume:   16.60 ml 10.39 ml/m LA Vol (A4C):   27.0 ml 16.90 ml/m LA Biplane Vol: 28.7 ml 17.96 ml/m  AORTIC VALVE AV Area (Vmax): 2.44 cm AV Vmax:        128.50 cm/s AV Peak Grad:   6.6 mmHg LVOT Vmax:      99.90 cm/s LVOT Vmean:     63.600 cm/s LVOT VTI:       0.189 m AI PHT:         422 msec  AORTA Ao Root diam: 2.90 cm Ao Asc diam:  3.70 cm MITRAL VALVE MV Area (PHT): 3.53 cm     SHUNTS MV Decel  Time: 215 msec     Systemic VTI:  0.19 m MV E velocity: 80.60 cm/s   Systemic Diam: 2.00 cm MV A velocity: 103.00 cm/s MV E/A ratio:  0.78 Maudine Sos MD Electronically signed by Maudine Sos MD Signature Date/Time: 06/05/2023/7:36:48 PM    Final    CT ABDOMEN PELVIS WO CONTRAST Result Date: 06/03/2023 CLINICAL DATA:  Chest pain, arm pain, abdominal pain EXAM: CT ABDOMEN AND PELVIS WITHOUT CONTRAST TECHNIQUE: Multidetector CT imaging of the abdomen and pelvis was performed following the standard protocol without IV contrast. Unenhanced CT was performed per clinician order. Lack of IV contrast limits sensitivity and specificity, especially for evaluation of abdominal/pelvic solid viscera. RADIATION DOSE REDUCTION: This exam was performed according to the departmental dose-optimization program which includes automated exposure control, adjustment of the mA and/or kV according to patient size and/or use of iterative reconstruction technique. COMPARISON:  06/03/2023, 06/01/2023, 11/02/2022 FINDINGS: Lower chest: Mild dependent lower lobe atelectasis. No acute airspace disease. Cardiomegaly without pericardial effusion. Hepatobiliary: Cholecystectomy. Unremarkable unenhanced appearance of the liver. Pancreas: Unremarkable unenhanced appearance. Spleen: Unremarkable unenhanced appearance. Adrenals/Urinary Tract: No urinary tract calculi or obstructive uropathy within either kidney. The adrenals and bladder are unremarkable. Stomach/Bowel: No  bowel obstruction or ileus. Postsurgical changes from prior bowel resection and ileocolic anastomosis right lower quadrant. No bowel wall thickening or inflammatory change. Mild retained stool within the proximal colon. Vascular/Lymphatic: Aortic atherosclerosis. No enlarged abdominal or pelvic lymph nodes. Reproductive: Uterus and bilateral adnexa are unremarkable. Other: No free fluid or free intraperitoneal gas. Small bilateral fat containing inguinal hernias. Musculoskeletal: Intramuscular hematoma within the left anterolateral abdomen, measuring up to 3.7 cm within the rectus sheath. The intramuscular hematoma extends along the transversus abdominal muscles as well, and extends approximately 20 cm in craniocaudal extent. Mild subcutaneous fat stranding within the left lateral abdomen. No acute or destructive bony abnormalities. Reconstructed images demonstrate no additional findings. IMPRESSION: 1. Extensive intramuscular hematoma within the left anterolateral abdominal wall, measuring up to 3.7 cm within the rectus sheath. 2. Small bilateral fat containing inguinal hernias. 3.  Aortic Atherosclerosis (ICD10-I70.0). Critical Value/emergent results were called by telephone at the time of interpretation on 06/03/2023 at 10:05 pm to provider MELANIE BELFI , who verbally acknowledged these results. Electronically Signed   By: Bobbye Burrow M.D.   On: 06/03/2023 22:06   DG Chest Port 1 View Result Date: 06/03/2023 CLINICAL DATA:  Chest pain. Left arm pain. Decreased oxygen  saturation. EXAM: PORTABLE CHEST 1 VIEW COMPARISON:  05/31/2023 FINDINGS: Shallow inspiration. Heart size and pulmonary vascularity are normal for technique. Consolidation or atelectasis in the left base. No pleural effusion or pneumothorax is identified. Calcification of the aorta. Mediastinal contours appear intact. Degenerative changes in the right shoulder and spine. Postoperative changes in the left shoulder. IMPRESSION: Shallow  inspiration with consolidation or atelectasis in the left base. Electronically Signed   By: Boyce Byes M.D.   On: 06/03/2023 20:24   DG Abd 1 View Result Date: 06/01/2023 EXAM: 1 VIEW XRAY OF THE ABDOMEN SUPINE 06/01/2023 05:09:00 PM COMPARISON: None available. CLINICAL HISTORY: 13086 Abdominal distension [id number]. FINDINGS: BOWEL: Moderate right colonic stool burden. No bowel obstruction. PERITONEUM AND SOFT TISSUES: No abnormal calcifications. Cholecystectomy clips. BONES: No acute osseous abnormality. IMPRESSION: 1. Moderate right colonic stool burden. 2. No evidence of bowel obstruction. Electronically signed by: Zadie Herter MD 06/01/2023 07:02 PM EDT RP Workstation: [id number]   DG Chest 2 View Result Date: 05/31/2023 CLINICAL DATA:  Left-sided chest pain and shortness of  breath EXAM: CHEST - 2 VIEW COMPARISON:  05/23/2023 FINDINGS: Frontal and lateral views of the chest demonstrate unremarkable cardiac silhouette. Streaky left lower lobe consolidation may reflect pneumonia or atelectasis. No effusion or pneumothorax. Stable left shoulder arthroplasty. IMPRESSION: 1. Streaky left lower lobe consolidation may reflect atelectasis or pneumonia. Electronically Signed   By: Bobbye Burrow M.D.   On: 05/31/2023 15:17   CT CHEST WO CONTRAST Result Date: 05/24/2023 CLINICAL DATA:  Pneumonia, shortness of breath, cough EXAM: CT CHEST WITHOUT CONTRAST TECHNIQUE: Multidetector CT imaging of the chest was performed following the standard protocol without IV contrast. RADIATION DOSE REDUCTION: This exam was performed according to the departmental dose-optimization program which includes automated exposure control, adjustment of the mA and/or kV according to patient size and/or use of iterative reconstruction technique. COMPARISON:  CT 11/02/2022.  Chest x-ray 05/23/2023 FINDINGS: Cardiovascular: Heart is upper limits normal scar sec heart is borderline in size. Diffuse 3 vessel coronary artery disease and  aortic atherosclerosis. No aneurysm. Mediastinum/Nodes: No mediastinal, hilar, or axillary adenopathy. Trachea and esophagus are unremarkable. Thyroid  unremarkable. Lungs/Pleura: Bibasilar airspace opacities, predominantly linear in the right lung base somewhat more confluent at the left base. This could reflect atelectasis or pneumonia. No effusions. Upper Abdomen: No acute findings Musculoskeletal: Chest wall soft tissues are unremarkable. No acute bony abnormality. IMPRESSION: Bilateral lower lobe airspace opacities, favor atelectasis in the right base. Left basilar opacity could reflect atelectasis or pneumonia. Borderline heart size.  Diffuse 3 vessel coronary artery disease. Aortic Atherosclerosis (ICD10-I70.0). Electronically Signed   By: Janeece Mechanic M.D.   On: 05/24/2023 01:17   DG Chest Port 1 View Result Date: 05/23/2023 CLINICAL DATA:  Shortness of breath and cough. EXAM: PORTABLE CHEST 1 VIEW COMPARISON:  Chest x-ray 05/23/2023. FINDINGS: The heart size and mediastinal contours are within normal limits. There is a small right pleural effusion. There are minimal patchy opacities in the lung bases. There is no pneumothorax. Left shoulder arthroplasty is present. No acute fractures are seen. IMPRESSION: 1. Small right pleural effusion. 2. Minimal patchy opacities in the lung bases, likely atelectasis. Electronically Signed   By: Tyron Gallon M.D.   On: 05/23/2023 19:47   DG Chest 2 View Result Date: 05/23/2023 CLINICAL DATA:  Productive cough EXAM: CHEST - 2 VIEW COMPARISON:  X-ray 03/09/2023 and older FINDINGS: No pneumothorax, effusion or edema. Normal cardiopericardial silhouette with calcified aorta. Mild left lung base opacity. Left lower lobe infiltrates possible. Recommend follow-up. Left shoulder reverse arthroplasty. Degenerative changes along the spine. Dense right upper lung nodule, likely calcified. IMPRESSION: Mild left lung base opacity. Subtle infiltrates possible. Recommend  follow-up. Electronically Signed   By: Adrianna Horde M.D.   On: 05/23/2023 17:40    Microbiology: Results for orders placed or performed during the hospital encounter of 06/03/23  Respiratory (~20 pathogens) panel by PCR     Status: None   Collection Time: 06/04/23  1:01 AM   Specimen: Nasopharyngeal Swab; Respiratory  Result Value Ref Range Status   Adenovirus NOT DETECTED NOT DETECTED Final   Coronavirus 229E NOT DETECTED NOT DETECTED Final    Comment: (NOTE) The Coronavirus on the Respiratory Panel, DOES NOT test for the novel  Coronavirus (2019 nCoV)    Coronavirus HKU1 NOT DETECTED NOT DETECTED Final   Coronavirus NL63 NOT DETECTED NOT DETECTED Final   Coronavirus OC43 NOT DETECTED NOT DETECTED Final   Metapneumovirus NOT DETECTED NOT DETECTED Final   Rhinovirus / Enterovirus NOT DETECTED NOT DETECTED Final   Influenza  A NOT DETECTED NOT DETECTED Final   Influenza B NOT DETECTED NOT DETECTED Final   Parainfluenza Virus 1 NOT DETECTED NOT DETECTED Final   Parainfluenza Virus 2 NOT DETECTED NOT DETECTED Final   Parainfluenza Virus 3 NOT DETECTED NOT DETECTED Final   Parainfluenza Virus 4 NOT DETECTED NOT DETECTED Final   Respiratory Syncytial Virus NOT DETECTED NOT DETECTED Final   Bordetella pertussis NOT DETECTED NOT DETECTED Final   Bordetella Parapertussis NOT DETECTED NOT DETECTED Final   Chlamydophila pneumoniae NOT DETECTED NOT DETECTED Final   Mycoplasma pneumoniae NOT DETECTED NOT DETECTED Final    Comment: Performed at [facility], [address], [place], [place] [address]  Culture, blood (routine x 2) Call MD if unable to obtain prior to antibiotics being given     Status: None   Collection Time: 06/04/23  1:10 AM   Specimen: BLOOD LEFT FOREARM  Result Value Ref Range Status   Specimen Description BLOOD LEFT FOREARM  Final   Special Requests   Final    BOTTLES DRAWN AEROBIC ONLY Blood Culture results may not be optimal due to an inadequate volume of blood  received in culture bottles   Culture   Final    NO GROWTH 5 DAYS Performed at [facility], [address] [place], [place] [address]    Report Status 06/09/2023 FINAL  Final  Culture, blood (routine x 2) Call MD if unable to obtain prior to antibiotics being given     Status: None   Collection Time: 06/04/23 11:00 AM   Specimen: BLOOD  Result Value Ref Range Status   Specimen Description BLOOD LEFT ANTECUBITAL  Final   Special Requests   Final    BOTTLES DRAWN AEROBIC AND ANAEROBIC Blood Culture results may not be optimal due to an inadequate volume of blood received in culture bottles   Culture   Final    NO GROWTH 5 DAYS Performed at [facility], [address], [place], [place] [address]    Report Status 06/09/2023 FINAL  Final    Labs: CBC: Recent Labs  Lab 06/04/23 1155 06/04/23 2226 06/05/23 0242 06/06/23 0930 06/07/23 0642 06/10/23 0225  WBC 10.8*  --  9.8 12.2* 9.9 5.9  NEUTROABS 7.3  --  7.1 9.5* 7.6 4.1  HGB 9.5* 9.0* 9.3* 10.0* 9.6* 10.2*  HCT 31.1* 29.2* 29.9* 32.1* 30.7* 32.3*  MCV 93.7  --  92.9 93.6 92.7 90.5  PLT 183  --  187 199 212 245   Basic Metabolic Panel: Recent Labs  Lab 06/04/23 0958 06/05/23 0242 06/08/23 1837 06/09/23 0240 06/09/23 0241 06/09/23 0858 06/10/23 0225  NA 135   < > 137 135 136  137 135 138  K 4.9   < > 5.8* 5.6* 5.5*  5.5* 5.0 4.7  CL 102   < > 104 104 104  103 102 104  CO2 23   < > 21* 24 25  25 24 23   GLUCOSE 173*   < > 189* 125* 126*  125* 280* 127*  BUN 42*   < > 30* 28* 29*  28* 28* 31*  CREATININE 2.15*   < > 2.06* 2.19* 2.26*  2.18* 2.26* 2.35*  CALCIUM  9.0   < > 9.5 9.5 9.6  9.6 9.5 9.9  MG 1.8  --   --   --   --   --   --   PHOS  --   --  3.5 4.1 3.9 3.7  --    < > =  values in this interval not displayed.   Liver Function Tests: Recent Labs  Lab 06/03/23 1939 06/04/23 0110 06/04/23 0958 06/08/23 1837 06/09/23 0240 06/09/23 0241 06/09/23 0858  AST 26 20 21   --   --   --   --    ALT 27 24 24   --   --   --   --   ALKPHOS 84 78 68  --   --   --   --   BILITOT 0.9 1.0 1.0  --   --   --   --   PROT 6.9 6.1* 5.9*  --   --   --   --   ALBUMIN  3.2* 2.9* 2.7* 3.1* 3.1* 3.1* 3.0*   CBG: Recent Labs  Lab 06/09/23 1635 06/09/23 2140 06/10/23 0634 06/10/23 1122 06/10/23 1612  GLUCAP 100* 186* 124* 143* 104*    Discharge time spent: 39 minutes.   Signed: Feliciana Horn, MD [facility] 06/10/2023

## 2023-06-10 NOTE — Plan of Care (Signed)

## 2023-06-10 NOTE — Plan of Care (Signed)
  Problem: Metabolic: Goal: Ability to maintain appropriate glucose levels will improve Outcome: Progressing   Problem: Nutritional: Goal: Maintenance of adequate nutrition will improve Outcome: Progressing   Problem: Skin Integrity: Goal: Risk for impaired skin integrity will decrease Outcome: Progressing   Problem: Tissue Perfusion: Goal: Adequacy of tissue perfusion will improve Outcome: Progressing   

## 2023-06-11 DIAGNOSIS — S301XXD Contusion of abdominal wall, subsequent encounter: Secondary | ICD-10-CM | POA: Diagnosis not present

## 2023-06-11 DIAGNOSIS — N184 Chronic kidney disease, stage 4 (severe): Secondary | ICD-10-CM | POA: Diagnosis not present

## 2023-06-11 DIAGNOSIS — J449 Chronic obstructive pulmonary disease, unspecified: Secondary | ICD-10-CM | POA: Diagnosis not present

## 2023-06-11 DIAGNOSIS — I10 Essential (primary) hypertension: Secondary | ICD-10-CM | POA: Diagnosis not present

## 2023-06-11 LAB — GLUCOSE, CAPILLARY
Glucose-Capillary: 134 mg/dL — ABNORMAL HIGH (ref 70–99)
Glucose-Capillary: 176 mg/dL — ABNORMAL HIGH (ref 70–99)
Glucose-Capillary: 149 mg/dL — ABNORMAL HIGH (ref 70–99)
Glucose-Capillary: 123 mg/dL — ABNORMAL HIGH (ref 70–99)

## 2023-06-11 LAB — BASIC METABOLIC PANEL WITH GFR
Anion gap: 9 (ref 5–15)
BUN: 36 mg/dL — ABNORMAL HIGH (ref 8–23)
CO2: 22 mmol/L (ref 22–32)
Calcium: 9.7 mg/dL (ref 8.9–10.3)
Chloride: 106 mmol/L (ref 98–111)
Creatinine, Ser: 2.37 mg/dL — ABNORMAL HIGH (ref 0.44–1.00)
GFR, Estimated: 21 mL/min — ABNORMAL LOW (ref >60)
Glucose, Bld: 144 mg/dL — ABNORMAL HIGH (ref 70–99)
Potassium: 4.6 mmol/L (ref 3.5–5.1)
Sodium: 137 mmol/L (ref 135–145)

## 2023-06-11 NOTE — Progress Notes (Signed)
 Occupational Therapy Treatment Patient Details Name: [name] MRN: [id number] DOB: 1950/10/20 Today's Date: 06/11/2023   History of present illness 73 y.o. female presents to [facility] 06/03/23 with L sided chest pain and generalized abdominal pain. Pt with abdominal wall hematoma, sepsis 2/2 LLL PNA. 4/17-4/27 admission for acute resp failure and sepsis 2/2 CAP and COPD exacerbation. PMH - breast CA, copd, cad, DM, HTN, ckd, lt shoulder replacement, anxiety, gout   OT comments  Patient received in supine and asking to use BSC. Patient able to roll to right side with CGA and mod assist to get to EOB with log rolling. Patient requiring in assist to power up and to use RW to transfer to [facility]. Patient able to perform toilet hygiene while standing with mod assist to complete before transfer to recliner. Patient performed grooming tasks seated in recliner with setup. Patient performed standing from recliner with min assist and performed reaching tasks to simulate standing ADLs. BP checked once seated with 187/64 (100). BP checked again following 2 minutes seated with 189/68 (102) and again after 5 minutes with 171/65 (96). Nursing notified. Discharge recommendations continue to be appropriate. Acute OT to continue to follow.       If plan is discharge home, recommend the following:  A lot of help with walking and/or transfers;A lot of help with bathing/dressing/bathroom;Assistance with cooking/housework;Help with stairs or ramp for entrance;Assist for transportation   Equipment Recommendations  Hospital bed    Recommendations for Other Services      Precautions / Restrictions Precautions Precautions: Fall Recall of Precautions/Restrictions: Impaired Precaution/Restrictions Comments: abdominal hematoma Required Braces or Orthoses: Other Brace Other Brace: abdominal binder Restrictions Weight Bearing Restrictions Per Provider Order: No       Mobility Bed Mobility Overal bed mobility: Needs  Assistance Bed Mobility: Rolling, Sidelying to Sit Rolling: Contact guard assist Sidelying to sit: Mod assist, HOB elevated, Used rails       General bed mobility comments: assistance with trunk and scooting to EOB    Transfers Overall transfer level: Needs assistance Equipment used: Rolling walker (2 wheels) Transfers: Sit to/from Stand, Bed to chair/wheelchair/BSC Sit to Stand: Min assist     Step pivot transfers: Min assist     General transfer comment: min assist to power up and for transfer to [facility] and recliner     Balance Overall balance assessment: Needs assistance Sitting-balance support: No upper extremity supported, Feet supported Sitting balance-Leahy Scale: Fair Sitting balance - Comments: EOB   Standing balance support: Single extremity supported, Bilateral upper extremity supported, During functional activity Standing balance-Leahy Scale: Poor Standing balance comment: assistance to steady while standing for peri care and reaching tasks                           ADL either performed or assessed with clinical judgement   ADL Overall ADL's : Needs assistance/impaired     Grooming: Wash/dry hands;Wash/dry face;Oral care;Set up;Sitting Grooming Details (indicate cue type and reason): in recliner Upper Body Bathing: Minimal assistance;Sitting           Lower Body Dressing: Maximal assistance;Sitting/lateral leans Lower Body Dressing Details (indicate cue type and reason): to donn socks Toilet Transfer: Minimal assistance;BSC/3in1;Rolling walker (2 wheels) Toilet Transfer Details (indicate cue type and reason): cues for hand placement and safety Toileting- Clothing Manipulation and Hygiene: Sit to/from stand;Moderate assistance Toileting - Clothing Manipulation Details (indicate cue type and reason): mod assist to complete while standing  Functional mobility during ADLs: Minimal assistance;Rolling walker (2 wheels)      Extremity/Trunk  Assessment              Vision       Perception     Praxis     Communication Communication Communication: Impaired Factors Affecting Communication: Hearing impaired   Cognition Arousal: Alert Behavior During Therapy: WFL for tasks assessed/performed Cognition: No apparent impairments             OT - Cognition Comments: pleasant and alert and oriented                 Following commands: Intact        Cueing   Cueing Techniques: Verbal cues, Tactile cues, Gestural cues  Exercises Exercises: Other exercises Other Exercises Other Exercises: standing tolerance while standing from recliner with reaching tasks to simulate self care tasks    Shoulder Instructions       General Comments following standing BP 187/64 (100), following 2 minutes seated 189/68 (102), following 5 minutes 171/65 SpO2 98% on RA    Pertinent Vitals/ Pain       Pain Assessment Pain Assessment: Faces Faces Pain Scale: Hurts little more Pain Location: abdomen Pain Descriptors / Indicators: Discomfort, Grimacing, Guarding, Sharp Pain Intervention(s): Limited activity within patient's tolerance, Monitored during session, Repositioned  Home Living                                          Prior Functioning/Environment              Frequency  Min 2X/week        Progress Toward Goals  OT Goals(current goals can now be found in the care plan section)  Progress towards OT goals: Progressing toward goals  Acute Rehab OT Goals Patient Stated Goal: to go home OT Goal Formulation: With patient Time For Goal Achievement: 06/20/23 Potential to Achieve Goals: Good ADL Goals Pt Will Perform Grooming: with supervision;standing Pt Will Perform Upper Body Bathing: with supervision;sitting Pt Will Perform Lower Body Bathing: with min assist;sit to/from stand Pt Will Perform Upper Body Dressing: with supervision;sitting Pt Will Perform Lower Body Dressing: with min  assist;sit to/from stand Pt Will Transfer to Toilet: with supervision;ambulating;bedside commode (with least restrictive device) Pt Will Perform Toileting - Clothing Manipulation and hygiene: with contact guard assist;sit to/from stand Additional ADL Goal #1: pt will perform bed mobility min A in prep for ADLs  Plan      Co-evaluation                 AM-PAC OT "6 Clicks" Daily Activity     Outcome Measure   Help from another person eating meals?: A Little Help from another person taking care of personal grooming?: A Little Help from another person toileting, which includes using toliet, bedpan, or urinal?: A Lot Help from another person bathing (including washing, rinsing, drying)?: A Lot Help from another person to put on and taking off regular upper body clothing?: A Little Help from another person to put on and taking off regular lower body clothing?: Total 6 Click Score: 14    End of Session Equipment Utilized During Treatment: Rolling walker (2 wheels);Other (comment) (ab binder)  OT Visit Diagnosis: Unsteadiness on feet (R26.81);Other abnormalities of gait and mobility (R26.89);Muscle weakness (generalized) (M62.81);Pain Pain - part of body:  (abdomin)   Activity  Tolerance Patient tolerated treatment well   Patient Left in chair;with call bell/phone within reach;with chair alarm set   Nurse Communication Mobility status;Other (comment) (BP)        Time: 6578-4696 OT Time Calculation (min): 28 min  Charges: OT General Charges $OT Visit: 1 Visit OT Treatments $Self Care/Home Management : 8-22 mins $Therapeutic Activity: 8-22 mins  Anitra Barn, OTA Acute Rehabilitation Services  Office [phone]   Jovita Nipper 06/11/2023, 1:17 PM

## 2023-06-11 NOTE — Plan of Care (Signed)
   Problem: Coping: Goal: Ability to adjust to condition or change in health will improve Outcome: Progressing   Problem: Metabolic: Goal: Ability to maintain appropriate glucose levels will improve Outcome: Progressing   Problem: Nutritional: Goal: Maintenance of adequate nutrition will improve Outcome: Progressing

## 2023-06-11 NOTE — Progress Notes (Signed)
 [facility]                                                                               [name], is a 73 y.o. female, DOB - 1950-03-16, JOA:[id number] Admit date - 06/03/2023    Outpatient [facility] the [facility] is [facility]  LOS - 7  days    Brief summary   [name] is a 73 y.o. female with medical history significant of history of breast cancer, MGUS, COPD, insulin -dependent DM type II, essential hypertension, anxiety, gout, hyperlipidemia, gerd, CKD stage IV, gout, and generalized weakness presented to the hospital with complaining of left-sided chest pain and generalized abdominal pain.   Patient was admitted from 4/18 to 4/27 acute hypoxic respiratory failure and sepsis  in the setting of community-acquired pneumonia and COPD exacerbation.  Patient required Levophed  drip in the setting of hypotension in the context of sepsis, required BiPAP completed course of IV ceftriaxone  azithromycin  for 7 days.  Blood culture from 4/13 no growth so far.   She is medically stable for discharge.    Assessment & Plan    Assessment and Plan:    Abdominal wall hematoma:  Chest x-ray showed left lower lobe pneumonia/consolidation. - CT abdomen pelvis showed Extensive intramuscular hematoma within the left anterolateral abdominal wall, measuring up to 3.7 cm within the rectus sheath. . Small bilateral fat containing inguinal hernias. -In the setting of persistent hypotension and abdominal wall hematoma , she underwent 1 unit of prbc transfusion.  Avoiding pharmacological DVT prophylaxis and aspirin .  Pain control.  - therapy evaluations are done and recommended HH.  - she has 24/7 hour supervision at home by family.    Hyperkalemia Resolved with lokelma .    Constipation Dulcolax suppository ordered. Continue with colace and senna.    Sepsis secondary to left lower lobe pneumonia  end-stage COPD and recent hospitalization for COPD  exacerbation on the verge of requiring intubation.  She was treated with ceftriaxone   and azithromycin .  Blood culture was negative.   Hypertension Optimal  On midodrine  TIDA.   Hyperlipidemia Continue with lipitor.    COPD Stable, no wheezing heard.   Stage 4 CKD Creatinine at 2.37 Monitor.    Iron  deficiency anemia Hemoglobin around 10.2    Estimated body mass index is 28.81 kg/m as calculated from the following:   Height as of this encounter: 4\' 11"  (1.499 m).   Weight as of this encounter: 64.7 kg.  Code Status:  full code.  DVT Prophylaxis:  Place and maintain sequential compression device Start: 06/05/23 0736 SCDs Start: 06/04/23 0043 Place TED hose Start: 06/04/23 0043   Level of Care: Level of care: Progressive Family Communication: none at bedside.   Disposition Plan:     Remains inpatient appropriate: Waiting for equipment to be delivered.   Procedures:  None.   Consultants:   None.   Antimicrobials:   Anti-infectives (From admission, onward)    Start     Dose/Rate Route Frequency Ordered Stop   06/06/23 0500  vancomycin  (VANCOREADY) IVPB 750 mg/150 mL  Status:  Discontinued        750 mg 150 mL/hr  over 60 Minutes Intravenous Every 48 hours 06/04/23 0313 06/05/23 1709   06/04/23 0100  ceFEPIme  (MAXIPIME ) 2 g in sodium chloride  0.9 % 100 mL IVPB  Status:  Discontinued        2 g 200 mL/hr over 30 Minutes Intravenous Every 24 hours 06/04/23 0057 06/05/23 1709   06/04/23 0100  vancomycin  (VANCOREADY) IVPB 1500 mg/300 mL        1,500 mg 150 mL/hr over 120 Minutes Intravenous  Once 06/04/23 0058 06/04/23 0346        Medications  Scheduled Meds:  allopurinol   100 mg Oral Daily   atorvastatin   20 mg Oral Daily   DULoxetine   30 mg Oral Daily   ferrous sulfate   325 mg Oral BID WC   fluticasone   2 spray Each Nare Daily   hydrOXYzine   50 mg Oral QHS   insulin  aspart  0-5 Units Subcutaneous QHS   insulin  aspart  0-6 Units Subcutaneous TID WC    midodrine   5 mg Oral TID WC   mometasone -formoterol   2 puff Inhalation BID   pantoprazole   40 mg Oral Daily   polyethylene glycol  17 g Oral Daily   senna-docusate  2 tablet Oral BID   sodium chloride  flush  3 mL Intravenous Q12H   sodium chloride  flush  3 mL Intravenous Q12H   Continuous Infusions: PRN Meds:.acetaminophen  **OR** acetaminophen , HYDROmorphone  (DILAUDID ) injection, ipratropium-albuterol , ondansetron  **OR** ondansetron  (ZOFRAN ) IV, oxyCODONE  **OR** oxyCODONE , sodium chloride  flush    Subjective:   [name] was seen and examined today.   No new complaints. Sleepy.   Objective:   Vitals:   06/11/23 0701 06/11/23 0856 06/11/23 1100 06/11/23 1612  BP: 109/67  99/68 119/75  Pulse:  80 78 73  Resp: 14 19 20 14   Temp: 99.2 F (37.3 C)  97.8 F (36.6 C) 98.6 F (37 C)  TempSrc: Oral  Oral Oral  SpO2:   95% 95%  Weight:      Height:        Intake/Output Summary (Last 24 hours) at 06/11/2023 1720 Last data filed at 06/11/2023 0326 Gross per 24 hour  Intake 240 ml  Output 300 ml  Net -60 ml   Filed Weights   06/03/23 1936 06/04/23 1803  Weight: 64 kg 64.7 kg     Exam General exam: Appears calm and comfortable  Respiratory system: Clear to auscultation. Respiratory effort normal. Cardiovascular system: S1 & S2 heard, RRR. No JVD,  Gastrointestinal system: Abdomen is mildy tender, abdominal binder in place.  Central nervous system: Alert and oriented. Extremities: warm extremities.  Skin: No rashes, Psychiatry:  Mood & affect appropriate.     Data Reviewed:  I have personally reviewed following labs and imaging studies   CBC Lab Results  Component Value Date   WBC 5.9 06/10/2023   RBC 3.57 (L) 06/10/2023   HGB 10.2 (L) 06/10/2023   HCT 32.3 (L) 06/10/2023   MCV 90.5 06/10/2023   MCH 28.6 06/10/2023   PLT 245 06/10/2023   MCHC 31.6 06/10/2023   RDW 15.7 (H) 06/10/2023   LYMPHSABS 1.0 06/10/2023   MONOABS 0.6 06/10/2023   EOSABS 0.2  06/10/2023   BASOSABS 0.0 06/10/2023     Last metabolic panel Lab Results  Component Value Date   NA 137 06/11/2023   K 4.6 06/11/2023   CL 106 06/11/2023   CO2 22 06/11/2023   BUN 36 (H) 06/11/2023   CREATININE 2.37 (H) 06/11/2023   GLUCOSE 144 (H) 06/11/2023  GFRNONAA 21 (L) 06/11/2023   GFRAA 36 (L) 03/15/2020   CALCIUM  9.7 06/11/2023   PHOS 3.7 06/09/2023   PROT 5.9 (L) 06/04/2023   ALBUMIN  3.0 (L) 06/09/2023   LABGLOB 3.9 04/22/2023   AGRATIO 1.3 05/10/2021   BILITOT 1.0 06/04/2023   ALKPHOS 68 06/04/2023   AST 21 06/04/2023   ALT 24 06/04/2023   ANIONGAP 9 06/11/2023    CBG (last 3)  Recent Labs    06/11/23 0558 06/11/23 1157 06/11/23 1613  GLUCAP 134* 176* 149*      Coagulation Profile: No results for input(s): "INR", "PROTIME" in the last 168 hours.   Radiology Studies: No results found.     Feliciana Horn M.D. [facility] 06/11/2023, 5:20 PM  Available via Epic secure chat 7am-7pm After 7 pm, please refer to night coverage provider listed on amion.

## 2023-06-11 NOTE — TOC Transition Note (Addendum)
 Transition of Care [facility]) - Discharge Note   Patient Details  Name: [name] MRN: [id number] Date of Birth: 11/25/1950  [facility] CM/SW Contact:  Omie Bickers, RN Phone Number: 06/11/2023, 11:11 AM   Clinical Narrative:     Spoke w patient at bedside. She states that family will be able to take her home at DC. Confirmed need for hospital bed, and address. Ordered to be delivered to the house today through [employer].  Notified Artavia with [facility] that patient may go home today  16:16 Hospital bed will be delivered Wednesday to the house     Barriers to Discharge: Continued Medical Work up   Patient Goals and CMS Choice   CMS Medicare.gov Compare Post Acute Care list provided to:: Patient Choice offered to / list presented to : Patient      Discharge Placement                       Discharge Plan and Services Additional resources added to the After Visit Summary for       Post Acute Care Choice: Durable Medical Equipment          DME Arranged: Hospital bed DME Agency: [employer] Date DME Agency Contacted: 06/11/23 Time DME Agency Contacted: 1110 Representative spoke with at DME Agency: Zula Hitch HH Arranged: PT, OT HH Agency: [facility] Date HH Agency Contacted: 06/06/23 Time HH Agency Contacted: 1608 Representative spoke with at [facility] Agency: Renetta Carter  Social Drivers of Health (SDOH) Interventions SDOH Screenings   Food Insecurity: Food Insecurity Present (06/04/2023)  Housing: Low Risk  (06/04/2023)  Transportation Needs: No Transportation Needs (06/04/2023)  Utilities: Not At Risk (06/04/2023)  Depression (PHQ2-9): Medium Risk (09/22/2021)  Social Connections: Moderately Isolated (06/04/2023)  Tobacco Use: Medium Risk (06/04/2023)     Readmission Risk Interventions    05/29/2023    4:13 PM  Readmission Risk Prevention Plan  Transportation Screening Complete  Medication Review (RN Care Manager) Complete   PCP or Specialist appointment within 3-5 days of discharge Complete  HRI or Home Care Consult Complete  SW Recovery Care/Counseling Consult Complete  Palliative Care Screening Complete  Skilled Nursing Facility Not Applicable

## 2023-06-12 DIAGNOSIS — S301XXD Contusion of abdominal wall, subsequent encounter: Secondary | ICD-10-CM | POA: Diagnosis not present

## 2023-06-12 LAB — BASIC METABOLIC PANEL WITH GFR
Anion gap: 9 (ref 5–15)
BUN: 30 mg/dL — ABNORMAL HIGH (ref 8–23)
CO2: 23 mmol/L (ref 22–32)
Calcium: 9.5 mg/dL (ref 8.9–10.3)
Chloride: 104 mmol/L (ref 98–111)
Creatinine, Ser: 2.05 mg/dL — ABNORMAL HIGH (ref 0.44–1.00)
GFR, Estimated: 25 mL/min — ABNORMAL LOW (ref >60)
Glucose, Bld: 130 mg/dL — ABNORMAL HIGH (ref 70–99)
Potassium: 4.8 mmol/L (ref 3.5–5.1)
Sodium: 136 mmol/L (ref 135–145)

## 2023-06-12 LAB — GLUCOSE, CAPILLARY
Glucose-Capillary: 109 mg/dL — ABNORMAL HIGH (ref 70–99)
Glucose-Capillary: 205 mg/dL — ABNORMAL HIGH (ref 70–99)

## 2023-06-12 MED ORDER — FERROUS SULFATE 325 (65 FE) MG PO TABS: 325.0000 mg | ORAL_TABLET | Freq: Every day | ORAL | Status: DC

## 2023-06-12 NOTE — Progress Notes (Signed)
 Mobility Specialist Progress Note;   06/12/23 0923  Mobility  Activity Transferred from bed to chair  Level of Assistance Contact guard assist, steadying assist  Assistive Device Front wheel walker  Distance Ambulated (ft) 5 ft  Activity Response Tolerated well  Mobility Referral Yes  Mobility visit 1 Mobility  Mobility Specialist Start Time (ACUTE ONLY) [id number]  Mobility Specialist Stop Time (ACUTE ONLY) 0933  Mobility Specialist Time Calculation (min) (ACUTE ONLY) 10 min   Pt agreeable to mobility. Required MinG for bed mobility and to safely transfer pt form bed to chair. VSS on RA. Pain tolerable during session. Pt left in chair with all needs met, alarm on.   Janit Meline Mobility Specialist Please contact via SecureChat or [employer] [phone]

## 2023-06-12 NOTE — Plan of Care (Signed)
  Problem: Coping: Goal: Ability to adjust to condition or change in health will improve Outcome: Progressing   Problem: Health Behavior/Discharge Planning: Goal: Ability to identify and utilize available resources and services will improve Outcome: Progressing   Problem: Metabolic: Goal: Ability to maintain appropriate glucose levels will improve Outcome: Progressing   Problem: Nutritional: Goal: Maintenance of adequate nutrition will improve Outcome: Progressing   Problem: Education: Goal: Knowledge of General Education information will improve Description: Including pain rating scale, medication(s)/side effects and non-pharmacologic comfort measures Outcome: Progressing   Problem: Clinical Measurements: Goal: Respiratory complications will improve Outcome: Progressing   Problem: Coping: Goal: Level of anxiety will decrease Outcome: Progressing   Problem: Pain Managment: Goal: General experience of comfort will improve and/or be controlled Outcome: Progressing

## 2023-06-12 NOTE — Discharge Summary (Signed)
 DISCHARGE SUMMARY  [name]  MR#: [id number]  DOB:1950/04/01  Date of Admission: 06/03/2023 Date of Discharge: 06/12/2023  Attending Physician:Lio Wehrly Constantine Delude, MD  Patient's MWN:[id number]  Disposition: D/C home   Follow-up Appts:  Follow-up Information     [facility] [place]  Follow up.   Why: for home health services Contact information: [address] [place] [address] [phone]         [facility] Follow up in 5 day(s).   Contact information: [address] [place] [address] [phone]                 Tests Needing Follow-up: - Assess blood pressure and determine if midodrine  can be discontinued - Follow-up hemoglobin is suggested - Assess abdomen with focus on site of abdominal wall hematoma  Discharge Diagnoses: Abdominal wall hematoma Acute blood loss anemia - iron  deficiency Hyperkalemia Constipation Recent hospitalization for left lower lobe pneumonia with sepsis DM2 Hypotension with reported history of HTN - due to blood loss HLD COPD CKD stage IV  Initial presentation: 73 year old with a history of breast cancer, MGUS, COPD, DM2, HTN, anxiety, HLD, gout, CKD stage IV, and chronic generalized weakness who presented to the hospital 4/28 with left-sided chest pain and generalized abdominal pain. She had been hospitalized 4/18-4/27 with pneumonia and an acute COPD exacerbation. During this current admission she was found to be suffering with an abdominal wall hematoma which was felt to be the etiology of her presenting chest and abdominal pain.   Hospital Course:  Abdominal wall hematoma CT abdomen/pelvis noted extensive intramuscular hematoma within the left anterolateral abdominal wall measuring up to 3.7 cm within the rectus sheath -possibly related to subcutaneous heparin  used during recent admission for DVT prophylaxis - anticoagulants were held during  this admission with no further expansion of hematoma appreciated and stabilization of hemoglobin   Acute blood loss anemia -iron  deficiency Due to abdominal wall hematoma -in setting of persisting hypotension patient was transfused with 1 unit PRBC -hemoglobin stable at time of discharge   Modestly low AM cortisol -not likely clinically significant In setting of modest hypotension - had an appropriate response to ACTH stim test, suggesting intact hypothalamic/pituitary/adrenal axis - doing well with midodrine  -follow-up blood pressure as outpatient with potential ability to discontinue midodrine  if she remains stable in follow-up   Hyperkalemia Corrected with use of Lokelma  without recurrence   Constipation Corrected with bowel regimen   Recent hospitalization for left lower lobe pneumonia with sepsis No persisting symptoms during this admission   DM2 CBG well-controlled -no change in usual home treatment plan   HLD Continue Lipitor   COPD Stable at present   CKD stage IV Creatinine stable/improving at time of discharge  Allergies as of 06/12/2023   No Known Allergies      Medication List     STOP taking these medications    aspirin  EC 81 MG tablet   Misc. Devices Misc       TAKE these medications    acetaminophen  650 MG CR tablet Commonly known as: TYLENOL  Take 650 mg by mouth every 8 (eight) hours as needed for pain.   albuterol  (2.5 MG/3ML) 0.083% nebulizer solution Commonly known as: PROVENTIL  Take 3 mLs (2.5 mg total) by nebulization every 6 (six) hours as needed for wheezing or shortness of breath.   albuterol  108 (90 Base) MCG/ACT inhaler Commonly known as: VENTOLIN  HFA Inhale 1-2 puffs into the lungs every  6 (six) hours as needed for wheezing or shortness of breath.   allopurinol  100 MG tablet Commonly known as: ZYLOPRIM  Take 100 mg by mouth daily.   anastrozole  1 MG tablet Commonly known as: ARIMIDEX  TAKE ONE TABLET BY MOUTH ONCE DAILY    atorvastatin  20 MG tablet Commonly known as: LIPITOR Take 20 mg by mouth daily.   Blood Pressure Monitor Devi Please provide patient with insurance approved blood pressure monitor. I10.0   diclofenac  Sodium 1 % Gel Commonly known as: Voltaren  Apply 1 Application topically 4 (four) times daily. What changed: when to take this   DULoxetine  30 MG capsule Commonly known as: CYMBALTA  Take 30 mg by mouth daily.   Easy Touch Pen Needles 31G X 8 MM Misc Generic drug: Insulin  Pen Needle Use to inject insulin  once daily and Victoza  once daily. Max of 2 pen needles a day. Must have office visit for refills   feeding supplement (GLUCERNA SHAKE) Liqd Take 237 mLs by mouth 2 (two) times daily between meals.   ferrous sulfate  325 (65 FE) MG tablet Take 1 tablet (325 mg total) by mouth daily with breakfast. What changed: when to take this   fluticasone  50 MCG/ACT nasal spray Commonly known as: FLONASE  Place 2 sprays into both nostrils daily.   gabapentin  100 MG capsule Commonly known as: NEURONTIN  Take 100-200 mg by mouth See admin instructions. Take one tablet by mouth in the morning and afternoon, then take 2 tablets every night per patient   glipiZIDE  2.5 MG Tabs Take 2.5 mg by mouth daily before breakfast.   hydrOXYzine  25 MG tablet Commonly known as: ATARAX  Take 50 mg by mouth at bedtime.   methocarbamol  500 MG tablet Commonly known as: ROBAXIN  Take 1 tablet (500 mg total) by mouth 2 (two) times daily.   midodrine  5 MG tablet Commonly known as: PROAMATINE  Take 1 tablet (5 mg total) by mouth 3 (three) times daily with meals.   Nebulizer Mask Adult Misc 1 Units by Does not apply route daily as needed. ICD 10 J44.9   nitroGLYCERIN  0.4 MG SL tablet Commonly known as: NITROSTAT  Place 1 tablet (0.4 mg total) under the tongue every 5 (five) minutes as needed for chest pain.   pantoprazole  40 MG tablet Commonly known as: PROTONIX  TAKE ONE TABLET BY MOUTH EVERY DAY FOR ACID  REFLUX   polyethylene glycol 17 g packet Commonly known as: MIRALAX  / GLYCOLAX  Take 17 g by mouth daily.   senna 8.6 MG Tabs tablet Commonly known as: SENOKOT Take 1 tablet (8.6 mg total) by mouth daily as needed for mild constipation or moderate constipation.   Symbicort  160-4.5 MCG/ACT inhaler Generic drug: budesonide -formoterol  Inhale 2 puffs into the lungs in the morning and at bedtime.               Durable Medical Equipment  (From admission, onward)           Start     Ordered   06/06/23 1611  For home use only DME Hospital bed  Once       Question Answer Comment  Length of Need Lifetime   The above medical condition requires: Patient requires the ability to reposition frequently   Bed type Semi-electric   Support Surface: Gel Overlay      06/06/23 1610            Day of Discharge BP 125/72 (BP Location: Left Arm)   Pulse 87   Temp 98 F (36.7 C) (Oral)  Resp (!) 23   Ht 4\' 11"  (1.499 m)   Wt 64.7 kg   LMP  (LMP Unknown) Comment: tubal ligation  SpO2 95%   BMI 28.81 kg/m   Physical Exam: General: No acute respiratory distress Lungs: Clear to auscultation bilaterally without wheezes or crackles Cardiovascular: Regular rate and rhythm without murmur gallop or rub normal S1 and S2 Abdomen: Nontender, nondistended, soft, bowel sounds positive, no rebound, no ascites, no appreciable mass Extremities: No significant cyanosis, clubbing, or edema bilateral lower extremities  Basic Metabolic Panel: Recent Labs  Lab 06/08/23 1837 06/09/23 0240 06/09/23 0241 06/09/23 0858 06/10/23 0225 06/11/23 0250 06/12/23 0234  NA 137 135 136  137 135 138 137 136  K 5.8* 5.6* 5.5*  5.5* 5.0 4.7 4.6 4.8  CL 104 104 104  103 102 104 106 104  CO2 21* 24 25  25 24 23 22 23   GLUCOSE 189* 125* 126*  125* 280* 127* 144* 130*  BUN 30* 28* 29*  28* 28* 31* 36* 30*  CREATININE 2.06* 2.19* 2.26*  2.18* 2.26* 2.35* 2.37* 2.05*  CALCIUM  9.5 9.5 9.6  9.6 9.5  9.9 9.7 9.5  PHOS 3.5 4.1 3.9 3.7  --   --   --     CBC: Recent Labs  Lab 06/06/23 0930 06/07/23 0642 06/10/23 0225  WBC 12.2* 9.9 5.9  NEUTROABS 9.5* 7.6 4.1  HGB 10.0* 9.6* 10.2*  HCT 32.1* 30.7* 32.3*  MCV 93.6 92.7 90.5  PLT 199 212 245    Time spent in discharge (includes decision making & examination of pt): 35 minutes  06/12/2023, 12:33 PM   Abbe Abate, MD [facility] Office  [phone]

## 2023-06-12 NOTE — Progress Notes (Signed)
 Physical Therapy Treatment Patient Details Name: [name] MRN: [id number] DOB: 1950-10-01 Today's Date: 06/12/2023   History of Present Illness 73 y.o. female presents [facility]/03/23 with L sided chest pain and generalized abdominal pain. Pt with abdominal wall hematoma, sepsis 2/2 LLL PNA. 4/17-4/27 admission for acute resp failure and sepsis 2/2 CAP and COPD exacerbation. PMH - breast CA, copd, cad, DM, HTN, ckd, lt shoulder replacement, anxiety, gout    PT Comments  Pt up in chair on arrival, pleasant and agreeable to therapy session. Pt demonstrated x2 sit<>stand transfer from chair with RW support and CGA for safety. Pt c/o of nausea and whooziness during gait this session, needing seated break. Pt able to ambulate back to room and RN notified of c/o of nausea. Pt continues to benefit from skilled PT services to progress toward functional mobility goals.     If plan is discharge home, recommend the following: A lot of help with bathing/dressing/bathroom;Assist for transportation;Help with stairs or ramp for entrance;Assistance with cooking/housework;A lot of help with walking and/or transfers   Can travel by private vehicle        Equipment Recommendations  Hospital bed    Recommendations for Other Services       Precautions / Restrictions Precautions Precautions: Fall Recall of Precautions/Restrictions: Impaired Precaution/Restrictions Comments: abdominal hematoma Required Braces or Orthoses: Other Brace Other Brace: abdominal binder Restrictions Weight Bearing Restrictions Per Provider Order: No     Mobility  Bed Mobility               General bed mobility comments: OOB in chair on arrival    Transfers Overall transfer level: Needs assistance Equipment used: Rolling walker (2 wheels) Transfers: Sit to/from Stand Sit to Stand: Contact guard assist           General transfer comment: CGA for saftey    Ambulation/Gait Ambulation/Gait assistance:  Contact guard assist, Min assist Gait Distance (Feet): 25 Feet (+25 with seated break with c/o of nausea and whooziness) Assistive device: Rolling walker (2 wheels) Gait Pattern/deviations: Step-through pattern, Decreased stride length, Shuffle, Narrow base of support, Knee flexed in stance - left Gait velocity: dec     General Gait Details: Low foot clearance, short step length, mostly CGA for forward stepping, but needing up to minA when turning, decreased pace, L knee flexed in stance but no buckling   Stairs             Wheelchair Mobility     Tilt Bed    Modified Rankin (Stroke Patients Only)       Balance Overall balance assessment: Needs assistance Sitting-balance support: No upper extremity supported, Feet supported Sitting balance-Leahy Scale: Fair Sitting balance - Comments: chair   Standing balance support: Bilateral upper extremity supported, During functional activity Standing balance-Leahy Scale: Poor Standing balance comment: reliant on UE support for balance during gait                            Communication Communication Communication: Impaired Factors Affecting Communication: Hearing impaired  Cognition Arousal: Alert Behavior During Therapy: WFL for tasks assessed/performed   PT - Cognitive impairments: No apparent impairments                         Following commands: Intact      Cueing Cueing Techniques: Verbal cues, Tactile cues, Gestural cues  Exercises      General Comments  General comments (skin integrity, edema, etc.): pt c/o of nausea and whooziness during ambulation, needing seated rest break, returned back to chair and notified RN. HR up to 105 during ambulation      Pertinent Vitals/Pain Pain Assessment Pain Assessment: Faces Faces Pain Scale: Hurts little more Pain Location: abdomen Pain Descriptors / Indicators: Discomfort, Grimacing, Guarding, Sharp Pain Intervention(s): Limited activity within  patient's tolerance, Monitored during session    Home Living                          Prior Function            PT Goals (current goals can now be found in the care plan section) Acute Rehab PT Goals PT Goal Formulation: With patient Time For Goal Achievement: 06/20/23 Progress towards PT goals: Progressing toward goals    Frequency    Min 2X/week      PT Plan      Co-evaluation              AM-PAC PT "6 Clicks" Mobility   Outcome Measure  Help needed turning from your back to your side while in a flat bed without using bedrails?: A Little Help needed moving from lying on your back to sitting on the side of a flat bed without using bedrails?: A Little Help needed moving to and from a bed to a chair (including a wheelchair)?: A Little Help needed standing up from a chair using your arms (e.g., wheelchair or bedside chair)?: A Little Help needed to walk in hospital room?: A Little Help needed climbing 3-5 steps with a railing? : A Lot 6 Click Score: 17    End of Session   Activity Tolerance: Treatment limited secondary to medical complications (Comment) (nausea) Patient left: in chair;with call bell/phone within reach;with chair alarm set Nurse Communication: Mobility status;Other (comment) (nausea) PT Visit Diagnosis: Unsteadiness on feet (R26.81);Other abnormalities of gait and mobility (R26.89);Muscle weakness (generalized) (M62.81);Pain     Time: 4010-2725 PT Time Calculation (min) (ACUTE ONLY): 15 min  Charges:    $Gait Training: 8-22 mins PT General Charges $$ ACUTE PT VISIT: 1 Visit                     Forrest Iha 06/12/2023, 1:13 PM

## 2023-06-12 NOTE — TOC Transition Note (Signed)
 Transition of Care [facility]) - Discharge Note   Patient Details  Name: [name] MRN: [id number] Date of Birth: [birth year]  [facility] CM/SW Contact:  Jeani Mill, RN Phone Number: 06/12/2023, 1:05 PM   Clinical Narrative:    Patient stable for discharge.  Notified [facility] of discharge.  Patient is agreeable to discharge prior to hospital bed delivery. [employer] is scheduled to deliver bed today.  Son will transport home.     Final next level of care: Home w Home Health Services Barriers to Discharge: Barriers Resolved   Patient Goals and CMS Choice   CMS Medicare.gov Compare Post Acute Care list provided to:: Patient Choice offered to / list presented to : Patient      Discharge Placement               Home        Discharge Plan and Services Additional resources added to the After Visit Summary for       Post Acute Care Choice: Durable Medical Equipment          DME Arranged: Hospital bed DME Agency: [employer] Date DME Agency Contacted: 06/11/23 Time DME Agency Contacted: 1110 Representative spoke with at DME Agency: Zula Hitch HH Arranged: PT, OT HH Agency: [facility] Date HH Agency Contacted: 06/06/23 Time HH Agency Contacted: 1608 Representative spoke with at [facility] Agency: Renetta Carter  Social Drivers of Health (SDOH) Interventions SDOH Screenings   Food Insecurity: Food Insecurity Present (06/04/2023)  Housing: Low Risk  (06/04/2023)  Transportation Needs: No Transportation Needs (06/04/2023)  Utilities: Not At Risk (06/04/2023)  Depression (PHQ2-9): Medium Risk (09/22/2021)  Social Connections: Moderately Isolated (06/04/2023)  Tobacco Use: Medium Risk (06/04/2023)     Readmission Risk Interventions    06/12/2023    1:05 PM 05/29/2023    4:13 PM  Readmission Risk Prevention Plan  Transportation Screening Complete Complete  Medication Review (RN Care Manager) Complete Complete  PCP or  Specialist appointment within 3-5 days of discharge Complete Complete  HRI or Home Care Consult Complete Complete  SW Recovery Care/Counseling Consult Complete Complete  Palliative Care Screening Complete Complete  Skilled Nursing Facility Not Applicable Not Applicable

## 2023-07-05 NOTE — Discharge Summary (Signed)
 Physician Discharge Summary  Patient ID: [name] MRN: [id number] DOB/AGE: 1951/02/05 73 y.o.  Admit date: 05/23/2023 Discharge date: 06/02/2023  Admission Diagnoses:  Discharge Diagnoses:  Principal Problem:   COPD (chronic obstructive pulmonary disease) (HCC) Active Problems:   Essential hypertension   Acute respiratory failure with hypoxia (HCC)   Type 2 diabetes mellitus with complication, without long-term current use of insulin  (HCC)   CAD (coronary artery disease)   Malignant neoplasm of upper-outer quadrant of right breast in female, estrogen receptor positive (HCC)   CKD stage 3b, GFR 30-44 ml/min (HCC)   Left lower lobe pneumonia   Hypotension   CKD (chronic kidney disease) stage 4, GFR 15-29 ml/min (HCC)   Acute on chronic respiratory failure with hypoxia (HCC)   Uncontrolled type 2 diabetes mellitus with hyperglycemia, with long-term current use of insulin  (HCC)   Discharged Condition: stable  Hospital Course: Patient is a 73 year old female with past medical history significant for COPD, type 2 diabetes mellitus, hypertension, hyperlipidemia, anxiety, gout and GERD.  Patient presented from urgent care to ED on 05/23/2023 with 4-day history of shortness of breath, hypoxia and productive cough.  Patient was negative for flu/covid at clinic.  On presentation, patient was noted to have oxygen  saturation of 91% and was in respiratory distress.  Patient was placed on BiPAP but after some time she removed the BiPAP and refused placement.  PCCM was consulted and despite saturating at 100% on 2 L/min [place] oxygen , she had accessory muscle use and was tachypneic, rather somnolent but arousable albeit confused.  Family declined intubation and she was continued on BiPAP.  Patient was eventually able to come off BiPAP, was transferred out of ICU.  Patient gradually improved.  Patient will be discharged, and will be followed up by primary care provider.  Patient will also follow-up with  pulmonary team on discharge.   Acute respiratory failure with hypoxia -Likely secondary to COPD exacerbation and presumed pneumonia. - Patient was initially on BiPAP and was able to come off BiPAP on 05/25/2023.  Family had declined intubation -Hypoxia has resolved and currently saturating in the high 90s on room air -Patient will follow-up with primary care provider and pulmonary team on discharge.   Community-acquired pneumonia Completed a course of IV ceftriaxone  and azithromycin . Will need follow-up chest x-ray in 4 weeks   COPD exacerbation -Clinically improved. -Transitioned from IV Solu-Medrol  60 mg every 12 hours to oral prednisone  40 mg daily. -Continue Brovana , Pulmicort , DuoNebs as needed and Yupelri . -Incentive spirometry and flutter valve.   Septic shock, resolved -Presumed secondary to pneumonia -Patient was on Levophed  drip on 4/18 - 4/19. -Continue to monitor blood pressure closely.  Systolic blood pressure is in the 90s.   Type 2 diabetes with hyperglycemia Recent A1c of 8.2% on 4/18. Continue to monitor closely.   AKI on CKD 4 -Last known creatinine was 2.07.   -Serum creatinine on presentation was 2.39, peaked at 3.04.   -Continue to avoid nephrotoxics or hypotension. -Prior to discharge, serum creatinine was down to 2.52.   Anemia Suspect multifactorial    Essential HTN Continue to monitor closely.   Gout Continue allopurinol    HLD Continue atorvastatin    Anxiety  Continue on duloxetine  and Atarax    GERD Continue Protonix    Generalized weakness In the setting of acute illness PT/OT eval    Body mass index is 28.05 kg/m.    Consults: pulmonary/intensive care   Discharge Exam: Blood pressure 108/70, pulse 85, temperature 98.3 F (36.8  C), temperature source Oral, resp. rate 16, height 4\' 11"  (1.499 m), weight 63 kg, SpO2 96%.   Disposition: Discharge disposition: 06-Home-Health Care Svc       Discharge Instructions     Diet  - low sodium heart healthy   Complete by: As directed    Diet Carb Modified   Complete by: As directed    Increase activity slowly   Complete by: As directed       Allergies as of 06/02/2023   No Known Allergies      Medication List     STOP taking these medications    amLODipine  10 MG tablet Commonly known as: NORVASC    losartan  50 MG tablet Commonly known as: COZAAR    metoprolol  succinate 50 MG 24 hr tablet Commonly known as: TOPROL -XL   ondansetron  4 MG disintegrating tablet Commonly known as: ZOFRAN -ODT       TAKE these medications    acetaminophen  650 MG CR tablet Commonly known as: TYLENOL  Take 650 mg by mouth every 8 (eight) hours as needed for pain.   albuterol  (2.5 MG/3ML) 0.083% nebulizer solution Commonly known as: PROVENTIL  Take 3 mLs (2.5 mg total) by nebulization every 6 (six) hours as needed for wheezing or shortness of breath.   albuterol  108 (90 Base) MCG/ACT inhaler Commonly known as: VENTOLIN  HFA Inhale 1-2 puffs into the lungs every 6 (six) hours as needed for wheezing or shortness of breath.   allopurinol  100 MG tablet Commonly known as: ZYLOPRIM  Take 100 mg by mouth daily.   anastrozole  1 MG tablet Commonly known as: ARIMIDEX  TAKE ONE TABLET BY MOUTH ONCE DAILY   atorvastatin  20 MG tablet Commonly known as: LIPITOR Take 20 mg by mouth daily.   Blood Pressure Monitor Devi Please provide patient with insurance approved blood pressure monitor. I10.0   diclofenac  Sodium 1 % Gel Commonly known as: Voltaren  Apply 1 Application topically 4 (four) times daily.   DULoxetine  30 MG capsule Commonly known as: CYMBALTA  Take 30 mg by mouth daily.   Easy Touch Pen Needles 31G X 8 MM Misc Generic drug: Insulin  Pen Needle Use to inject insulin  once daily and Victoza  once daily. Max of 2 pen needles a day. Must have office visit for refills   feeding supplement (GLUCERNA SHAKE) Liqd Take 237 mLs by mouth 2 (two) times daily between  meals.   fluticasone  50 MCG/ACT nasal spray Commonly known as: FLONASE  Place 2 sprays into both nostrils daily.   gabapentin  100 MG capsule Commonly known as: NEURONTIN  Take 100-200 mg by mouth See admin instructions. Take one tablet by mouth in the morning and afternoon, then take 2 tablets every night per patient   glipiZIDE  2.5 MG Tabs Take 2.5 mg by mouth daily before breakfast.   hydrOXYzine  25 MG tablet Commonly known as: ATARAX  Take 50 mg by mouth at bedtime.   methocarbamol  500 MG tablet Commonly known as: ROBAXIN  Take 1 tablet (500 mg total) by mouth 2 (two) times daily.   Nebulizer Mask Adult Misc 1 Units by Does not apply route daily as needed. ICD 10 J44.9   nitroGLYCERIN  0.4 MG SL tablet Commonly known as: NITROSTAT  Place 1 tablet (0.4 mg total) under the tongue every 5 (five) minutes as needed for chest pain.   pantoprazole  40 MG tablet Commonly known as: PROTONIX  TAKE ONE TABLET BY MOUTH EVERY DAY FOR ACID REFLUX   polyethylene glycol 17 g packet Commonly known as: MIRALAX  / GLYCOLAX  Take 17 g by mouth daily.  senna 8.6 MG Tabs tablet Commonly known as: SENOKOT Take 1 tablet (8.6 mg total) by mouth daily as needed for mild constipation or moderate constipation.   Symbicort  160-4.5 MCG/ACT inhaler Generic drug: budesonide -formoterol  Inhale 2 puffs into the lungs in the morning and at bedtime.        Follow-up Information     [facility] [place]  Follow up.   Why: [facility] will be providing home health services.  They will call you in the next 24-48 hours to set up services. Contact information: [address] [place] [address] [phone]         [employer] Follow up.   Why: [employer] will deliver a rolator and oxygen  tank  to your hospital room before you go home.They will set up concentrator at home for oxygen  Contact information: [address] [place]  [address] [phone]         [facility]. Schedule an appointment as soon as possible for a visit in 1 week(s).   Contact information: [address] [place] [address] [phone]                Time spent: 35 Minutes  Signed: Doroteo Gasmen 07/05/2023, 2:27 AM

## 2023-07-17 ENCOUNTER — Encounter (HOSPITAL_COMMUNITY): Payer: Self-pay | Admitting: Emergency Medicine

## 2023-07-17 ENCOUNTER — Observation Stay (HOSPITAL_COMMUNITY)
Admission: EM | Admit: 2023-07-17 | Discharge: 2023-07-18 | Disposition: A | Discharge status: Home / Self Care | Attending: Family Medicine | Admitting: Family Medicine

## 2023-07-17 ENCOUNTER — Emergency Department (HOSPITAL_COMMUNITY)

## 2023-07-17 DIAGNOSIS — I2510 Atherosclerotic heart disease of native coronary artery without angina pectoris: Secondary | ICD-10-CM | POA: Diagnosis not present

## 2023-07-17 DIAGNOSIS — I129 Hypertensive chronic kidney disease with stage 1 through stage 4 chronic kidney disease, or unspecified chronic kidney disease: Secondary | ICD-10-CM | POA: Insufficient documentation

## 2023-07-17 DIAGNOSIS — M940 Chondrocostal junction syndrome [Tietze]: Secondary | ICD-10-CM | POA: Diagnosis not present

## 2023-07-17 DIAGNOSIS — R0602 Shortness of breath: Secondary | ICD-10-CM | POA: Diagnosis present

## 2023-07-17 DIAGNOSIS — R072 Precordial pain: Principal | ICD-10-CM | POA: Insufficient documentation

## 2023-07-17 DIAGNOSIS — J449 Chronic obstructive pulmonary disease, unspecified: Secondary | ICD-10-CM | POA: Diagnosis not present

## 2023-07-17 DIAGNOSIS — R079 Chest pain, unspecified: Secondary | ICD-10-CM | POA: Diagnosis present

## 2023-07-17 DIAGNOSIS — I120 Hypertensive chronic kidney disease with stage 5 chronic kidney disease or end stage renal disease: Secondary | ICD-10-CM | POA: Diagnosis not present

## 2023-07-17 DIAGNOSIS — Z7984 Long term (current) use of oral hypoglycemic drugs: Secondary | ICD-10-CM | POA: Diagnosis not present

## 2023-07-17 DIAGNOSIS — E1122 Type 2 diabetes mellitus with diabetic chronic kidney disease: Secondary | ICD-10-CM | POA: Diagnosis not present

## 2023-07-17 DIAGNOSIS — N184 Chronic kidney disease, stage 4 (severe): Secondary | ICD-10-CM | POA: Diagnosis not present

## 2023-07-17 DIAGNOSIS — Z853 Personal history of malignant neoplasm of breast: Secondary | ICD-10-CM | POA: Diagnosis not present

## 2023-07-17 DIAGNOSIS — Z87891 Personal history of nicotine dependence: Secondary | ICD-10-CM | POA: Insufficient documentation

## 2023-07-17 DIAGNOSIS — Z79899 Other long term (current) drug therapy: Secondary | ICD-10-CM | POA: Insufficient documentation

## 2023-07-17 LAB — BASIC METABOLIC PANEL WITH GFR
Anion gap: 14 (ref 5–15)
BUN: 17 mg/dL (ref 8–23)
CO2: 20 mmol/L — ABNORMAL LOW (ref 22–32)
Calcium: 9.3 mg/dL (ref 8.9–10.3)
Chloride: 107 mmol/L (ref 98–111)
Creatinine, Ser: 2.11 mg/dL — ABNORMAL HIGH (ref 0.44–1.00)
GFR, Estimated: 24 mL/min — ABNORMAL LOW (ref >60)
Glucose, Bld: 106 mg/dL — ABNORMAL HIGH (ref 70–99)
Potassium: 3.1 mmol/L — ABNORMAL LOW (ref 3.5–5.1)
Sodium: 141 mmol/L (ref 135–145)

## 2023-07-17 LAB — HEPATIC FUNCTION PANEL
ALT: 16 U/L (ref 0–44)
AST: 24 U/L (ref 15–41)
Albumin: 4 g/dL (ref 3.5–5.0)
Alkaline Phosphatase: 147 U/L — ABNORMAL HIGH (ref 38–126)
Bilirubin, Direct: 0.2 mg/dL (ref 0.0–0.2)
Indirect Bilirubin: 0.5 mg/dL (ref 0.3–0.9)
Total Bilirubin: 0.7 mg/dL (ref 0.0–1.2)
Total Protein: 8.3 g/dL — ABNORMAL HIGH (ref 6.5–8.1)

## 2023-07-17 LAB — TROPONIN I (HIGH SENSITIVITY)
Troponin I (High Sensitivity): 10 ng/L (ref <18)
Troponin I (High Sensitivity): 11 ng/L (ref <18)

## 2023-07-17 LAB — CBC
HCT: 37.6 % (ref 36.0–46.0)
Hemoglobin: 12.1 g/dL (ref 12.0–15.0)
MCH: 29.4 pg (ref 26.0–34.0)
MCHC: 32.2 g/dL (ref 30.0–36.0)
MCV: 91.3 fL (ref 80.0–100.0)
Platelets: 287 10*3/uL (ref 150–400)
RBC: 4.12 MIL/uL (ref 3.87–5.11)
RDW: 15.5 % (ref 11.5–15.5)
WBC: 10 10*3/uL (ref 4.0–10.5)
nRBC: 0.2 % (ref 0.0–0.2)

## 2023-07-17 LAB — BRAIN NATRIURETIC PEPTIDE: B Natriuretic Peptide: 14.5 pg/mL (ref 0.0–100.0)

## 2023-07-17 LAB — LIPASE, BLOOD: Lipase: 40 U/L (ref 11–51)

## 2023-07-17 MED ORDER — ALUM & MAG HYDROXIDE-SIMETH 200-200-20 MG/5ML PO SUSP
30.0000 mL | Freq: Once | ORAL | Status: AC
  Administered 2023-07-17: 30 mL via ORAL
  Filled 2023-07-17: qty 30

## 2023-07-17 NOTE — ED Provider Triage Note (Signed)
 Emergency Medicine Provider Triage Evaluation Note  [name] , a 73 y.o. female  was evaluated in triage.  Pt complains of left anterior chest pain.  Patient reports she has had pain since sometime yesterday.  It is somewhat worse with deep breath.  She denies fever.  She has had slight amount of cough.  Mild increase in shortness of breath.  No lower extremity swelling or calf pain.  Review of Systems  Positive: Chest pain Negative: Lower extremity swelling  Physical Exam  BP (!) 158/75 (BP Location: Right Arm)   Pulse 98   Temp 98.5 F (36.9 C) (Oral)   Resp 20   LMP  (LMP Unknown) Comment: tubal ligation  SpO2 100%  Gen:   Awake, no distress mental status clear.  No respiratory distress but appears slightly uncomfortable with deep inspiration. Resp:  Normal effort breath sounds are symmetric.  Patient does have some crackles bilateral mid fields. MSK:   Moves extremities without difficulty no significant peripheral edema or calf tenderness Other:  Reproducible left anterior chest wall pain to palpation.  Right mastectomy.  Medical Decision Making  Medically screening exam initiated at 6:24 PM.  Appropriate orders placed.  Sierra Dresser was informed that the remainder of the evaluation will be completed by another provider, this initial triage assessment does not replace that evaluation, and the importance of remaining in the ED until their evaluation is complete.     Wynetta Heckle, MD 07/17/23 [phone]

## 2023-07-17 NOTE — ED Provider Notes (Signed)
 Emergency Department Provider Note   I have reviewed the triage vital signs and the nursing notes.   HISTORY  Chief Complaint Chest Pain   HPI [name] is a 73 y.o. female COPD, DM, HTN, and HLD presents to the emergency department for evaluation of left-sided chest pressure since yesterday.  Symptoms have been fairly constant.  She is having pain in the anterior chest.  No fevers or chills.  She does have some associated shortness of breath.  She did have a recent hospitalization 1 month ago for an abdominal hematoma which has resolved.  No abdominal pain, vomiting, diarrhea. Does have a history of AMI in the distant past. Was seen by her PCP today and sent to the ED for further evaluation. Given ASA and 3 nitroglycerin  without relief in pain.   Past Medical History:  Diagnosis Date   Ambulates with cane    straight   Angina    no current problems per patient at PAT appt 11/05/18, nitroglycerin  sl 06/30/20   Arthritis    HANDS   Asthma    Breast cancer (HCC)    Cancer (HCC) 09/04/2017   Right Breast   Carpal tunnel syndrome, bilateral 11/26/2016   Cholelithiasis    Cocaine abuse (HCC) 07/2012   per E.R. drug screen   COPD (chronic obstructive pulmonary disease) (HCC)    per patient   Coronary artery disease    Diabetes mellitus without mention of complication    type 2   Dysphagia    esophageal dysmotility on 03/2011 esophagram    Fatty liver disease, nonalcoholic 2009   on Imaging.    GERD (gastroesophageal reflux disease)    Headache    Hearing loss    bilateral - no hearing aids   History of colonic polyps 2009   adenomatous 2009, HP in 2009, 2011, 2013.    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 Respiratory: Positive shortness of breath. Gastrointestinal: No abdominal pain.  No nausea, no vomiting.   Skin: Negative for rash. Neurological: Negative for headaches   ____________________________________________   PHYSICAL EXAM:  VITAL SIGNS: ED Triage Vitals [07/17/23 1756]  Encounter Vitals Group     BP (!) 158/75     Pulse Rate 98     Resp 20     Temp 98.5 F (36.9 C)     Temp Source Oral     SpO2 100 %   Constitutional: Alert and oriented. Well appearing and in no acute distress. Eyes: Conjunctivae are normal.  Head: Atraumatic. Nose: No congestion/rhinnorhea. Mouth/Throat: Mucous membranes are moist.  Neck: No stridor.   Cardiovascular: Normal rate, regular rhythm. Good peripheral circulation. Grossly normal heart sounds.   Respiratory: Increased respiratory effort.  No retractions. Lungs CTAB. Gastrointestinal: Soft and nontender. No distention.  Musculoskeletal: No gross deformities of extremities. Neurologic:  Normal speech and language.  Skin:  Skin is warm, dry and intact. No rash noted.  ____________________________________________   LABS (all labs ordered are listed, but only abnormal results are displayed)  Labs Reviewed  BASIC METABOLIC PANEL WITH GFR - Abnormal; Notable for the following components:  Result Value   Potassium 3.1 (*)    CO2 20 (*)    Glucose, Bld 106 (*)    Creatinine, Ser 2.11 (*)    GFR, Estimated 24 (*)    All other components within normal limits  HEPATIC FUNCTION PANEL - Abnormal; Notable for the following components:   Total Protein 8.3 (*)    Alkaline Phosphatase 147 (*)    All other components within normal limits  CBC WITH DIFFERENTIAL/PLATELET - Abnormal; Notable for the following components:   Hemoglobin 11.4 (*)    HCT 35.5 (*)    RDW 15.8 (*)    Abs Immature Granulocytes 0.11 (*)    All other components within normal limits  COMPREHENSIVE METABOLIC  PANEL WITH GFR - Abnormal; Notable for the following components:   Potassium 3.1 (*)    Glucose, Bld 143 (*)    Creatinine, Ser 1.99 (*)    GFR, Estimated 26 (*)    All other components within normal limits  MAGNESIUM  - Abnormal; Notable for the following components:   Magnesium  1.3 (*)    All other components within normal limits  CBG MONITORING, ED - Abnormal; Notable for the following components:   Glucose-Capillary 134 (*)    All other components within normal limits  CBC  BRAIN NATRIURETIC PEPTIDE  LIPASE, BLOOD  TROPONIN I (HIGH SENSITIVITY)  TROPONIN I (HIGH SENSITIVITY)  TROPONIN I (HIGH SENSITIVITY)   ____________________________________________  EKG   EKG Interpretation Date/Time:  Wednesday July 17 2023 18:03:17 EDT Ventricular Rate:  100 PR Interval:  132 QRS Duration:  86 QT Interval:  356 QTC Calculation: 459 R Axis:   -48  Text Interpretation: Normal sinus rhythm Left axis deviation Possible Anterior infarct , age undetermined Abnormal ECG When compared with ECG of 03-Jun-2023 19:32, PREVIOUS ECG IS PRESENT nonspecific T wave flattening, otherwise no sig change from previous Confirmed by Wynetta Heckle [phone]) on 07/17/2023 6:12:00 PM        ____________________________________________  RADIOLOGY  DG Chest 2 View Result Date: 07/17/2023 CLINICAL DATA:  Chest pain. EXAM: CHEST - 2 VIEW COMPARISON:  June 03, 2023 FINDINGS: The cardiac silhouette is mildly enlarged and unchanged in size. There is marked severity calcification of the aortic arch. Low lung volumes are noted with very mild atelectasis is seen within the left lung base. No focal consolidation, pleural effusion or pneumothorax is identified. Radiopaque surgical clips are seen overlying the periphery of the mid right hemithorax. There is evidence of prior left shoulder arthroplasty with multilevel degenerative changes noted throughout the thoracic spine. IMPRESSION: Low lung volumes with very mild  left basilar atelectasis. Electronically Signed   By: Virgle Grime M.D.   On: 07/17/2023 18:55    ____________________________________________   PROCEDURES  Procedure(s) performed:   Procedures  None  ____________________________________________   INITIAL IMPRESSION / ASSESSMENT AND PLAN / ED COURSE  Pertinent labs & imaging results that were available during my care of the patient were reviewed by me and considered in my medical decision making (see chart for details).   This patient is Presenting for Evaluation of CP, which does require a range of treatment options, and is a complaint that involves a high risk of morbidity and mortality.  The Differential Diagnoses includes but is not exclusive to acute coronary syndrome, aortic dissection, pulmonary embolism, cardiac tamponade, community-acquired pneumonia, pericarditis, musculoskeletal chest wall pain, etc.   Critical Interventions-    Medications  alum & mag hydroxide-simeth (MAALOX/MYLANTA) 200-200-20 MG/5ML suspension 30 mL (30 mLs  Oral Given 07/17/23 2358)  ipratropium-albuterol  (DUONEB) 0.5-2.5 (3) MG/3ML nebulizer solution 3 mL (3 mLs Nebulization Given 07/18/23 0022)  potassium chloride  SA (KLOR-CON  M) CR tablet 40 mEq (40 mEq Oral Given 07/18/23 0455)  oxyCODONE  (Oxy IR/ROXICODONE ) immediate release tablet 5 mg (5 mg Oral Given 07/18/23 0835)  acetaminophen  (TYLENOL ) tablet 650 mg (650 mg Oral Given 07/18/23 0903)    Reassessment after intervention: symptoms improved.    Clinical Laboratory Tests Ordered, included troponin negative x 2. LFTs normal. Creatinine 2.11. Lipase negative.   Radiologic Tests Ordered, included CXR. I independently interpreted the images and agree with radiology interpretation.   Cardiac Monitor Tracing which shows NSR.    Social Determinants of Health Risk patient is not an active  smoker.   Consult complete with TRH. Plan for consultation.   Medical Decision Making: Summary:  The  patient presents emergency department with chest pain since yesterday.  She has multiple risk factors for ACS including prior MI.  Her EKG is overall reassuring and has had 2 negative troponins prior to my bedside evaluation.  She does continue to have ongoing chest pain that is not easily reproducible.  PE is a consideration although lower on my differential without hypoxemia or tachycardia.  Plan for Maalox in case this may be GI in etiology.  She does not have a creatinine that will support a CT angio.   Reevaluation with update and discussion with patient. Continues to have active CP. MSK is possible. PE is also a consideration. Discussed obs admit for pain mgmt. She is in agreement.   Patient's presentation is most consistent with acute presentation with potential threat to life or bodily function.   Disposition: admit  ____________________________________________  FINAL CLINICAL IMPRESSION(S) / ED DIAGNOSES  Final diagnoses:  Precordial chest pain     NEW OUTPATIENT MEDICATIONS STARTED DURING THIS VISIT:  Discharge Medication List as of 07/18/2023  2:38 PM     START taking these medications   Details  oxyCODONE -acetaminophen  (PERCOCET) 5-325 MG tablet Take 1 tablet by mouth every 4 (four) hours as needed for severe pain (pain score 7-10)., Starting Thu 07/18/2023, Until Fri 07/17/2024 at 2359, Normal        Note:  This document was prepared using Dragon voice recognition software and may include unintentional dictation errors.  Abby Hocking, MD, [facility] Emergency [facility]    Jaslynn Thome, Shereen Dike, MD 07/19/23 [phone]

## 2023-07-17 NOTE — ED Triage Notes (Signed)
 Pt BIB [facility] from Drs office with reports of CP. Pt was given 3 nitroglycerin  and 324 ASA with no relief.

## 2023-07-17 NOTE — ED Notes (Signed)
 Labs drawn and sent -

## 2023-07-18 ENCOUNTER — Observation Stay (HOSPITAL_COMMUNITY)

## 2023-07-18 DIAGNOSIS — R079 Chest pain, unspecified: Secondary | ICD-10-CM

## 2023-07-18 DIAGNOSIS — R072 Precordial pain: Secondary | ICD-10-CM | POA: Diagnosis not present

## 2023-07-18 LAB — COMPREHENSIVE METABOLIC PANEL WITH GFR
ALT: 15 U/L (ref 0–44)
AST: 17 U/L (ref 15–41)
Albumin: 3.8 g/dL (ref 3.5–5.0)
Alkaline Phosphatase: 105 U/L (ref 38–126)
Anion gap: 11 (ref 5–15)
BUN: 16 mg/dL (ref 8–23)
CO2: 22 mmol/L (ref 22–32)
Calcium: 9.1 mg/dL (ref 8.9–10.3)
Chloride: 108 mmol/L (ref 98–111)
Creatinine, Ser: 1.99 mg/dL — ABNORMAL HIGH (ref 0.44–1.00)
GFR, Estimated: 26 mL/min — ABNORMAL LOW (ref >60)
Glucose, Bld: 143 mg/dL — ABNORMAL HIGH (ref 70–99)
Potassium: 3.1 mmol/L — ABNORMAL LOW (ref 3.5–5.1)
Sodium: 141 mmol/L (ref 135–145)
Total Bilirubin: 0.8 mg/dL (ref 0.0–1.2)
Total Protein: 7.9 g/dL (ref 6.5–8.1)

## 2023-07-18 LAB — CBC WITH DIFFERENTIAL/PLATELET
Abs Immature Granulocytes: 0.11 10*3/uL — ABNORMAL HIGH (ref 0.00–0.07)
Basophils Absolute: 0.1 10*3/uL (ref 0.0–0.1)
Basophils Relative: 1 %
Eosinophils Absolute: 0.4 10*3/uL (ref 0.0–0.5)
Eosinophils Relative: 4 %
HCT: 35.5 % — ABNORMAL LOW (ref 36.0–46.0)
Hemoglobin: 11.4 g/dL — ABNORMAL LOW (ref 12.0–15.0)
Immature Granulocytes: 1 %
Lymphocytes Relative: 20 %
Lymphs Abs: 1.9 10*3/uL (ref 0.7–4.0)
MCH: 29.1 pg (ref 26.0–34.0)
MCHC: 32.1 g/dL (ref 30.0–36.0)
MCV: 90.6 fL (ref 80.0–100.0)
Monocytes Absolute: 0.6 10*3/uL (ref 0.1–1.0)
Monocytes Relative: 7 %
Neutro Abs: 6.1 10*3/uL (ref 1.7–7.7)
Neutrophils Relative %: 67 %
Platelets: 246 10*3/uL (ref 150–400)
RBC: 3.92 MIL/uL (ref 3.87–5.11)
RDW: 15.8 % — ABNORMAL HIGH (ref 11.5–15.5)
WBC: 9.1 10*3/uL (ref 4.0–10.5)
nRBC: 0.2 % (ref 0.0–0.2)

## 2023-07-18 LAB — TROPONIN I (HIGH SENSITIVITY): Troponin I (High Sensitivity): 9 ng/L (ref <18)

## 2023-07-18 LAB — ECHOCARDIOGRAM COMPLETE
Area-P 1/2: 3.46 cm2
S' Lateral: 2.9 cm

## 2023-07-18 LAB — CBG MONITORING, ED: Glucose-Capillary: 134 mg/dL — ABNORMAL HIGH (ref 70–99)

## 2023-07-18 LAB — MAGNESIUM: Magnesium: 1.3 mg/dL — ABNORMAL LOW (ref 1.7–2.4)

## 2023-07-18 MED ORDER — DICLOFENAC SODIUM 1 % EX GEL: 1.0000 | Freq: Four times a day (QID) | CUTANEOUS | Status: DC | PRN

## 2023-07-18 MED ORDER — AMLODIPINE BESYLATE 5 MG PO TABS: 5.0000 mg | ORAL_TABLET | Freq: Every day | ORAL | Status: DC

## 2023-07-18 MED ORDER — ATORVASTATIN CALCIUM 10 MG PO TABS
20.0000 mg | ORAL_TABLET | Freq: Every day | ORAL | Status: DC
  Administered 2023-07-18: 20 mg via ORAL
  Filled 2023-07-18: qty 2

## 2023-07-18 MED ORDER — OXYCODONE-ACETAMINOPHEN 5-325 MG PO TABS: 1.0000 | ORAL_TABLET | ORAL | 0 refills | Status: DC | PRN

## 2023-07-18 MED ORDER — MELATONIN 3 MG PO TABS: 3.0000 mg | ORAL_TABLET | Freq: Every evening | ORAL | Status: DC | PRN

## 2023-07-18 MED ORDER — AMLODIPINE BESYLATE 5 MG PO TABS
5.0000 mg | ORAL_TABLET | Freq: Every day | ORAL | Status: DC
  Administered 2023-07-18: 5 mg via ORAL
  Filled 2023-07-18: qty 1

## 2023-07-18 MED ORDER — ALBUTEROL SULFATE (2.5 MG/3ML) 0.083% IN NEBU: 2.5000 mg | INHALATION_SOLUTION | Freq: Four times a day (QID) | RESPIRATORY_TRACT | Status: DC | PRN

## 2023-07-18 MED ORDER — IPRATROPIUM-ALBUTEROL 0.5-2.5 (3) MG/3ML IN SOLN
3.0000 mL | Freq: Once | RESPIRATORY_TRACT | Status: AC
  Administered 2023-07-18: 3 mL via RESPIRATORY_TRACT
  Filled 2023-07-18: qty 3

## 2023-07-18 MED ORDER — FERROUS SULFATE 325 (65 FE) MG PO TABS
325.0000 mg | ORAL_TABLET | Freq: Every day | ORAL | Status: DC
  Administered 2023-07-18: 325 mg via ORAL
  Filled 2023-07-18: qty 1

## 2023-07-18 MED ORDER — PANTOPRAZOLE SODIUM 40 MG PO TBEC
40.0000 mg | DELAYED_RELEASE_TABLET | Freq: Every day | ORAL | Status: DC
  Administered 2023-07-18: 40 mg via ORAL
  Filled 2023-07-18: qty 1

## 2023-07-18 MED ORDER — NITROGLYCERIN 0.4 MG SL SUBL: 0.4000 mg | SUBLINGUAL_TABLET | SUBLINGUAL | Status: DC | PRN

## 2023-07-18 MED ORDER — ALLOPURINOL 100 MG PO TABS
100.0000 mg | ORAL_TABLET | Freq: Every day | ORAL | Status: DC
  Administered 2023-07-18: 100 mg via ORAL
  Filled 2023-07-18: qty 1

## 2023-07-18 MED ORDER — ANASTROZOLE 1 MG PO TABS
1.0000 mg | ORAL_TABLET | Freq: Every day | ORAL | Status: DC
  Administered 2023-07-18: 1 mg via ORAL
  Filled 2023-07-18: qty 1

## 2023-07-18 MED ORDER — GLUCERNA SHAKE PO LIQD
237.0000 mL | Freq: Two times a day (BID) | ORAL | Status: DC
  Filled 2023-07-18: qty 237

## 2023-07-18 MED ORDER — INSULIN ASPART 100 UNIT/ML IJ SOLN
0.0000 [IU] | Freq: Three times a day (TID) | INTRAMUSCULAR | Status: DC
  Administered 2023-07-18: 1 [IU] via SUBCUTANEOUS

## 2023-07-18 MED ORDER — GABAPENTIN 100 MG PO CAPS: 100.0000 mg | ORAL_CAPSULE | ORAL | Status: DC

## 2023-07-18 MED ORDER — GABAPENTIN 100 MG PO CAPS
100.0000 mg | ORAL_CAPSULE | Freq: Two times a day (BID) | ORAL | Status: DC
  Administered 2023-07-18: 100 mg via ORAL
  Filled 2023-07-18: qty 1

## 2023-07-18 MED ORDER — ONDANSETRON HCL 4 MG/2ML IJ SOLN: 4.0000 mg | Freq: Four times a day (QID) | INTRAMUSCULAR | Status: DC | PRN

## 2023-07-18 MED ORDER — NALOXONE HCL 0.4 MG/ML IJ SOLN: 0.4000 mg | INTRAMUSCULAR | Status: DC | PRN

## 2023-07-18 MED ORDER — POLYETHYLENE GLYCOL 3350 17 G PO PACK
17.0000 g | PACK | Freq: Every day | ORAL | Status: DC
  Filled 2023-07-18: qty 1

## 2023-07-18 MED ORDER — SENNA 8.6 MG PO TABS: 1.0000 | ORAL_TABLET | Freq: Every day | ORAL | Status: DC | PRN

## 2023-07-18 MED ORDER — GABAPENTIN 100 MG PO CAPS: 200.0000 mg | ORAL_CAPSULE | Freq: Every day | ORAL | Status: DC

## 2023-07-18 MED ORDER — POTASSIUM CHLORIDE CRYS ER 20 MEQ PO TBCR
40.0000 meq | EXTENDED_RELEASE_TABLET | Freq: Once | ORAL | Status: AC
  Administered 2023-07-18: 40 meq via ORAL
  Filled 2023-07-18: qty 2

## 2023-07-18 MED ORDER — DULOXETINE HCL 30 MG PO CPEP
30.0000 mg | ORAL_CAPSULE | Freq: Every day | ORAL | Status: DC
  Administered 2023-07-18: 30 mg via ORAL
  Filled 2023-07-18: qty 1

## 2023-07-18 MED ORDER — FENTANYL CITRATE PF 50 MCG/ML IJ SOSY: 25.0000 ug | PREFILLED_SYRINGE | INTRAMUSCULAR | Status: DC | PRN

## 2023-07-18 MED ORDER — ACETAMINOPHEN 650 MG RE SUPP: 650.0000 mg | Freq: Four times a day (QID) | RECTAL | Status: DC | PRN

## 2023-07-18 MED ORDER — FLUTICASONE FUROATE-VILANTEROL 200-25 MCG/ACT IN AEPB
1.0000 | INHALATION_SPRAY | Freq: Every day | RESPIRATORY_TRACT | Status: DC
  Administered 2023-07-18: 1 via RESPIRATORY_TRACT
  Filled 2023-07-18: qty 28

## 2023-07-18 MED ORDER — ACETAMINOPHEN 325 MG PO TABS: 650.0000 mg | ORAL_TABLET | Freq: Four times a day (QID) | ORAL | Status: DC | PRN

## 2023-07-18 MED ORDER — HYDROXYZINE HCL 25 MG PO TABS: 50.0000 mg | ORAL_TABLET | Freq: Every day | ORAL | Status: DC

## 2023-07-18 MED ORDER — ACETAMINOPHEN 325 MG PO TABS
650.0000 mg | ORAL_TABLET | Freq: Once | ORAL | Status: AC
  Administered 2023-07-18: 650 mg via ORAL
  Filled 2023-07-18: qty 2

## 2023-07-18 MED ORDER — OXYCODONE HCL 5 MG PO TABS
5.0000 mg | ORAL_TABLET | Freq: Once | ORAL | Status: AC
  Administered 2023-07-18: 5 mg via ORAL
  Filled 2023-07-18: qty 1

## 2023-07-18 MED ORDER — METHOCARBAMOL 500 MG PO TABS
500.0000 mg | ORAL_TABLET | Freq: Two times a day (BID) | ORAL | Status: DC
  Administered 2023-07-18: 500 mg via ORAL
  Filled 2023-07-18: qty 1

## 2023-07-18 NOTE — ED Notes (Signed)
 Pt in echo

## 2023-07-18 NOTE — ED Notes (Signed)
 [name] has not returned this writer's number.   Pt suggested I call her son.  [name] called and no answer.  HIPAA compliant VM left.

## 2023-07-18 NOTE — ED Notes (Signed)
 Hospitalist at bedside

## 2023-07-18 NOTE — Consult Note (Signed)
 Hospitalist Consult Note    Patient: [name] WUJ:[id number] DOB: [birth year] DOA: 07/17/2023 DOS: the patient was seen and examined on 07/18/2023 [facility]CP: [facility]  Patient coming from: Home  Chief Complaint:  Chief Complaint  Patient presents with   Chest Pain       HPI:  [name] is a 73 y.o. F with CKD IV baseline Cr 2.0, DM, CAD s/p remote stent, BrCA s/p mastectomy and prior history of HTN (not currently on medication) as well as positive cocaine drug screen who presented with chest pain for 1 day.  Patient was in her usual state of health until 1 day prior to admission, she was doing some laundry and felt dizzy.  She sat down and noted she had some sharp anterior chest pain.  This fluctuated all afternoon, she happened to have an appointment with her PCP so she went to the PCPs office, reported the chest pain was sent to the ER.  In the ER, EKG showed no change from previous EKG, no ST changes.  High-sensitivity troponin was negative.  Chest x-ray was clear.        Review of Systems  Respiratory:  Negative for cough and shortness of breath.   Cardiovascular:  Positive for chest pain. Negative for palpitations, orthopnea and leg swelling.  Neurological:  Positive for dizziness.  All other systems reviewed and are negative.    Past Medical History:  Diagnosis Date   Ambulates with cane    straight   Angina    no current problems per patient at PAT appt 11/05/18, nitroglycerin  sl 06/30/20   Arthritis    HANDS   Asthma    Breast cancer (HCC)    Cancer (HCC) 09/04/2017   Right Breast   Carpal tunnel syndrome, bilateral 11/26/2016   Cholelithiasis    Cocaine abuse (HCC) 07/2012   per E.R. drug screen   COPD (chronic obstructive pulmonary disease) (HCC)    per patient   Coronary artery disease    Diabetes mellitus without mention of complication    type 2   Dysphagia    esophageal dysmotility on 03/2011 esophagram    Fatty liver  disease, nonalcoholic 2009   on Imaging.    GERD (gastroesophageal reflux disease)    Headache    Hearing loss    bilateral - no hearing aids   History of colonic polyps 2009   adenomatous 2009, HP in 2009, 2011, 2013.    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   Wears dentures    full upper and partial lower   Wears glasses    Past Surgical History:  Procedure Laterality Date   ABDOMINAL ANGIOGRAM  02/20/2011   Procedure: ABDOMINAL ANGIOGRAM;  Surgeon: Sharene Dauer, MD;  Location: [facility] CATH LAB;  Service: Cardiovascular;;   BREAST BIOPSY Left    CARPAL TUNNEL RELEASE Right 07/04/2017   Procedure: RIGHT CARPAL TUNNEL RELEASE;  Surgeon: Brunilda Capra, MD;  Location: [facility];  Service: Orthopedics;  Laterality: Right;   CARPAL TUNNEL RELEASE Left 09/12/2017   Procedure: LEFT CARPAL TUNNEL RELEASE;  Surgeon: Brunilda Capra, MD;  Location: [facility];  Service: Orthopedics;  Laterality: Left;   CATARACT EXTRACTION     CHOLECYSTECTOMY N/A 11/11/2018   Procedure: ATTEMPTED LAPAROSCOPIC CHOLECUSTECOMY,  OPEN CHOLECYSTECTOMY;  Surgeon: Oza Blumenthal, MD;  Location: [facility] OR;  Service: General;  Laterality: N/A;   COLONOSCOPY  11/30/2011   Procedure: COLONOSCOPY;  Surgeon: Pietro Bridegroom, MD;  Location: [facility] ENDOSCOPY;  Service: Endoscopy;  Laterality: N/A;   CORONARY ANGIOPLASTY WITH STENT PLACEMENT  02/20/2011   ERCP N/A 10/03/2018   Procedure: ENDOSCOPIC RETROGRADE CHOLANGIOPANCREATOGRAPHY (ERCP);  Surgeon: Asencion Blacksmith, MD;  Location: [facility];  Service: Endoscopy;  Laterality: N/A;   ESOPHAGOGASTRODUODENOSCOPY  11/30/2011   Procedure: ESOPHAGOGASTRODUODENOSCOPY (EGD);  Surgeon: Pietro Bridegroom, MD;  Location: [facility];  Service: Endoscopy;  Laterality: N/A;   ESOPHAGOGASTRODUODENOSCOPY N/A  02/23/2015   Procedure: ESOPHAGOGASTRODUODENOSCOPY (EGD);  Surgeon: Kenney Peacemaker, MD;  Location: [facility] ENDOSCOPY;  Service: Endoscopy;  Laterality: N/A;   EYE SURGERY Bilateral    cataracts removed   INTRAOPERATIVE CHOLANGIOGRAM N/A 11/11/2018   Procedure: Intraoperative Cholangiogram;  Surgeon: Oza Blumenthal, MD;  Location: [facility] OR;  Service: General;  Laterality: N/A;   IR REMOVAL TUN ACCESS W/ PORT W/O FL MOD SED  06/30/2020   LEFT HEART CATHETERIZATION WITH CORONARY ANGIOGRAM N/A 02/20/2011   Procedure: LEFT HEART CATHETERIZATION WITH CORONARY ANGIOGRAM;  Surgeon: Sharene Dauer, MD;  Location: [facility] CATH LAB;  Service: Cardiovascular;  Laterality: N/A;   MASTECTOMY Right 11/05/2017   MASTECTOMY W/ SENTINEL NODE BIOPSY Right 11/27/2017   MASTECTOMY W/ SENTINEL NODE BIOPSY Right 11/27/2017   Procedure: RIGHT TOTAL MASTECTOMY WITH SENTINEL LYMPH NODE BIOPSY;  Surgeon: Ayesha Lente, MD;  Location: [facility] OR;  Service: General;  Laterality: Right;   PORT A CATH REVISION Left 05/07/2018   Procedure: PORT A CATH REVISION;  Surgeon: Ayesha Lente, MD;  Location: [facility] ORS;  Service: General;  Laterality: Left;   PORTACATH PLACEMENT Left 11/27/2017   Procedure: INSERTION PORT-A-CATH;  Surgeon: Ayesha Lente, MD;  Location: [facility] OR;  Service: General;  Laterality: Left;   REVERSE SHOULDER ARTHROPLASTY Left 03/09/2021   Procedure: REVERSE SHOULDER ARTHROPLASTY;  Surgeon: Ellard Gunning, MD;  Location: [facility] ORS;  Service: Orthopedics;  Laterality: Left;   RIGHT COLECTOMY  02/2007   for post polypectomy colonic perforation   SPHINCTEROTOMY  10/03/2018   Procedure: SPHINCTEROTOMY;  Surgeon: Asencion Blacksmith, MD;  Location: [facility] ENDOSCOPY;  Service: Endoscopy;;   TUBAL LIGATION     Social History:  reports that she quit smoking about 13 years ago. Her smoking use included cigarettes. She started smoking about 60 years ago. She has a 11.8 pack-year smoking history. She has never used smokeless  tobacco. She reports that she does not currently use alcohol. She reports that she does not currently use drugs after having used the following drugs: Cocaine.  No Known Allergies  Family History  Problem Relation Age of Onset   Heart disease Mother    Diabetes Mother    Cancer Father        unsure what kind   Diabetes Sister    Colon cancer Neg Hx    Breast cancer Neg Hx     Prior to Admission medications   Medication Sig Start Date End Date Taking? Authorizing Provider  acetaminophen  (TYLENOL ) 650 MG CR tablet Take 650 mg by mouth every 8 (eight) hours as needed for pain.   Yes [provider]  albuterol  (PROVENTIL ) (2.5 MG/3ML) 0.083% nebulizer solution Take 3 mLs (2.5 mg total) by nebulization every 6 (six) hours as needed for wheezing or shortness of breath. 08/02/21  Yes Burnette, Leory Rands, PA-C  albuterol  (VENTOLIN  HFA) 108 (90 Base)  MCG/ACT inhaler Inhale 1-2 puffs into the lungs every 6 (six) hours as needed for wheezing or shortness of breath. 09/14/21  Yes Hassie Lint, PA-C  allopurinol  (ZYLOPRIM ) 100 MG tablet Take 100 mg by mouth daily.   Yes [provider]  anastrozole  (ARIMIDEX ) 1 MG tablet TAKE ONE TABLET BY MOUTH ONCE DAILY 06/18/22  Yes Gudena, Vinay, MD  atorvastatin  (LIPITOR) 20 MG tablet Take 20 mg by mouth daily.   Yes [provider]  diclofenac  Sodium (VOLTAREN ) 1 % GEL Apply 1 Application topically 4 (four) times daily. Patient taking differently: Apply 1 Application topically in the morning and at bedtime. 09/13/21  Yes Gudena, Vinay, MD  DULoxetine  (CYMBALTA ) 30 MG capsule Take 30 mg by mouth daily.   Yes [provider]  feeding supplement, GLUCERNA SHAKE, (GLUCERNA SHAKE) LIQD Take 237 mLs by mouth 2 (two) times daily between meals. 06/02/23  Yes Fonnie Iba I, MD  ferrous sulfate  325 (65 FE) MG tablet Take 1 tablet (325 mg total) by mouth daily with breakfast. 06/12/23  Yes Abbe Abate, MD  fluticasone   (FLONASE ) 50 MCG/ACT nasal spray Place 2 sprays into both nostrils daily. 05/10/21  Yes Newlin, Enobong, MD  gabapentin  (NEURONTIN ) 100 MG capsule Take 100-200 mg by mouth See admin instructions. Take one tablet by mouth in the morning and afternoon, then take 2 tablets every night per patient 03/12/22  Yes [provider]  hydrOXYzine  (ATARAX ) 25 MG tablet Take 50 mg by mouth at bedtime. 05/07/23  Yes [provider]  methocarbamol  (ROBAXIN ) 500 MG tablet Take 1 tablet (500 mg total) by mouth 2 (two) times daily. 03/09/23  Yes Prosperi, Christian H, PA-C  nitroGLYCERIN  (NITROSTAT ) 0.4 MG SL tablet Place 1 tablet (0.4 mg total) under the tongue every 5 (five) minutes as needed for chest pain. 03/15/20  Yes Fleming, Zelda W, NP  oxyCODONE -acetaminophen  (PERCOCET) 5-325 MG tablet Take 1 tablet by mouth every 4 (four) hours as needed for severe pain (pain score 7-10). 07/18/23 07/17/24 Yes Dollye Glasser, Willis Harter, MD  polyethylene glycol (MIRALAX  / GLYCOLAX ) 17 g packet Take 17 g by mouth daily. 06/03/23  Yes Doroteo Gasmen, MD  senna (SENOKOT) 8.6 MG TABS tablet Take 1 tablet (8.6 mg total) by mouth daily as needed for mild constipation or moderate constipation. 06/02/23  Yes Doroteo Gasmen, MD  SYMBICORT  160-4.5 MCG/ACT inhaler Inhale 2 puffs into the lungs in the morning and at bedtime. 02/28/22  Yes [provider]  Blood Pressure Monitor DEVI Please provide patient with insurance approved blood pressure monitor. I10.0 11/15/20   Collins Dean, NP  glipiZIDE  2.5 MG TABS Take 2.5 mg by mouth daily before breakfast. Patient not taking: Reported on 07/18/2023 06/02/23   Fonnie Iba I, MD  Insulin  Pen Needle (EASY TOUCH PEN NEEDLES) 31G X 8 MM MISC Use to inject insulin  once daily and Victoza  once daily. Max of 2 pen needles a day. Must have office visit for refills 03/28/22   Joaquin Mulberry, MD  pantoprazole  (PROTONIX ) 40 MG tablet TAKE ONE TABLET BY MOUTH EVERY DAY FOR ACID  REFLUX 11/02/21   Fleming, Zelda W, NP  Respiratory Therapy Supplies (NEBULIZER MASK ADULT) MISC 1 Units by Does not apply route daily as needed. ICD 10 J44.9 05/08/21   Collins Dean, NP    Physical Exam: Vitals:   07/18/23 1051 07/18/23 1055 07/18/23 1100 07/18/23 1130  BP:      Pulse: 82 77 90 87  Resp: 19 (!)  25 (!) 22 (!) 21  Temp:      TempSrc:      SpO2: 95% 97% 96% 95%   Elderly adult female, sitting up in bed, interactive and appropriate, appears uncomfortable with moving around in bed Oropharynx moist, no oral lesions, edentulous, lips normal, sclera anicteric, conjunctiva pink, lids and lashes normal.  No nasal deformity, discharge, or epistaxis RRR, no murmurs, no peripheral edema, no JVD Respiratory rate normal, lungs clear without rales or wheezes There is pain to palpation of the precordium/left sternum without underlying mass appreciated, and old right mastectomy Abdomen soft without tenderness palpation or guarding, no ascites or distention Attention normal, affect anxious, judgment and insight appear normal, oriented to person, place, time, face symmetric, speech dysarthric but at baseline, moves all extremities with normal strength and coordination    Data Reviewed: Basic metabolic panel shows normal electrolytes and renal function CBC unremarkable Troponin is negative EKG, personally reviewed, shows T wave flattening, no change from previous Echocardiogram obtained, shows no regional wall motion abnormalities, mostly normal valves, normal EF    Assessment and Plan: Costochondritis Patient's chest pain is atypical in character, and negative troponin in setting of constant pain for over 12 hours rules out MI.  She had an stress test under similar circumstances last September that was low risk.  Here echocardiogram was normal.  Wells score 0, PE doubted.    Her pain was easily reproducible with palpation of the chst, and after hot pad, breakfast and Percocet,  she was pain free, ambulated with nursing and was stable for discharge.  Recommend PCP follow up for hypertension.      Author: Ephriam Hashimoto, MD 07/18/2023 11:54 AM  For on call review [email or web address].

## 2023-07-18 NOTE — Progress Notes (Signed)
  Carryover admission to the Day [facility]mitter.  I discussed this case with the EDP, Dr. Abby Hocking.  Per these discussions:   This is a 73 year old female with medical history that includes CKD stage IV, with baseline creatinine around 2.0-2.5, type 2 diabetes mellitus, who is being admitted for overnight observation for atypical chest pain after presenting with 1 day of nearly constant left-sided chest pain, associate with some shortness of breath, but not exertional and without improvement with multiple doses of sublingual nitroglycerin .  Not on any blood thinners as an outpatient.  Chest pain has been constant for greater than 24 hours now, with nonelevated troponin x 2.   Vital signs notable for afebrile, heart rates in the 80s to 90s.  Systolic blood pressures in the 140s - 160s, and she is maintaining oxygen  saturations in the mid to high 90s on room air.  Labs notable for the following: Initial troponin 10, with repeat value noted to be 11.  Renal function appears to be at baseline, potassium is 3.1.  EKG is reported to show sinus rhythm without overt evidence of acute ischemic changes.  Chest x-ray is suggestive of left basilar atelectasis, but otherwise no evidence of acute cardiopulmonary process, including no residual infiltrate following prior hospitalization for pneumonia.  She has received full dose aspirin  this evening.  I have placed an order for observation to med/tele for further evaluation management of the above.  I have placed some additional preliminary admit orders via the adult multi-morbid admission order set. I have also ordered prn IV fentanyl , NSAIDs from a tree, echocardiogram in the morning.  I have ordered potassium chloride  40 mill equivalents p.o. x 1 dose now, as well as additional morning labs that include CMP, CBC, and magnesium  level.  Regarding her history of type 2 diabetes mellitus, with most recent hemoglobin A1c of 8.2% in April 2025, I have ordered  Accu-Cheks before every meal and at bedtime with low-dose sliding scale insulin .    Camelia Cavalier, DO Hospitalist

## 2023-07-18 NOTE — ED Notes (Signed)
 Pt noted to ambulate using walker w/o staff assistance.  Pt denies chest pain or complaints.  Sts she feels good.

## 2023-07-18 NOTE — ED Notes (Signed)
 Pt is aware she is up for discharge and asked this writer to call her grandson to come and get her.  Pt's phone is dead.  HIPAA compliant VM left for grandson.

## 2023-08-19 IMAGING — DX DG BONE SURVEY MET
10 series · 10 of 10 positions shown · non-contrast
Comparison: None.

CLINICAL DATA: Multiple myeloma

EXAM:
METASTATIC BONE SURVEY

[skull lat]
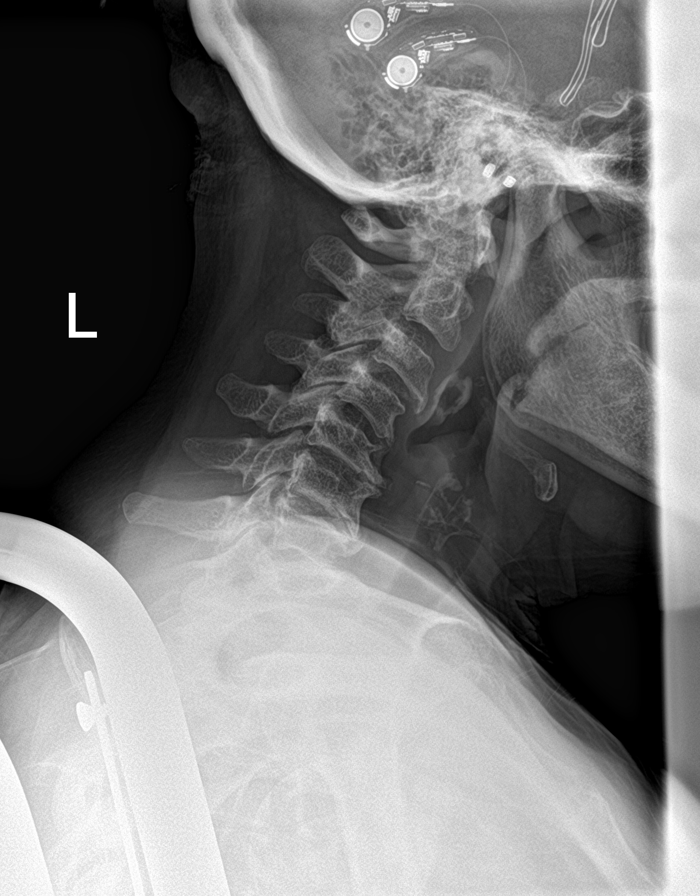

[shoulder ap (1 of 2)]
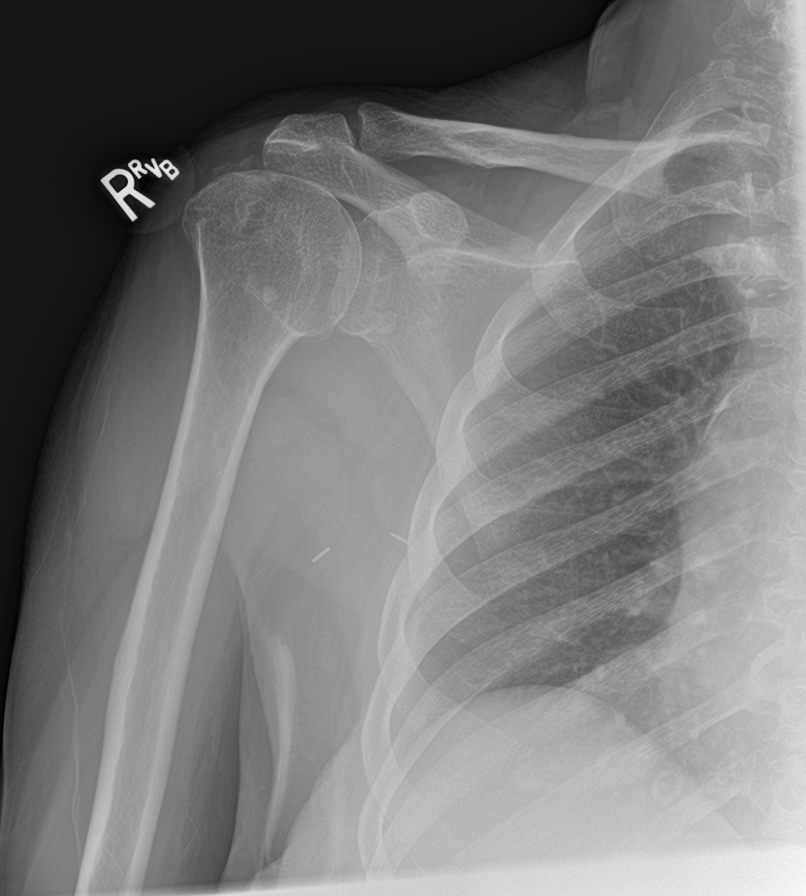

[shoulder ap (2 of 2)]
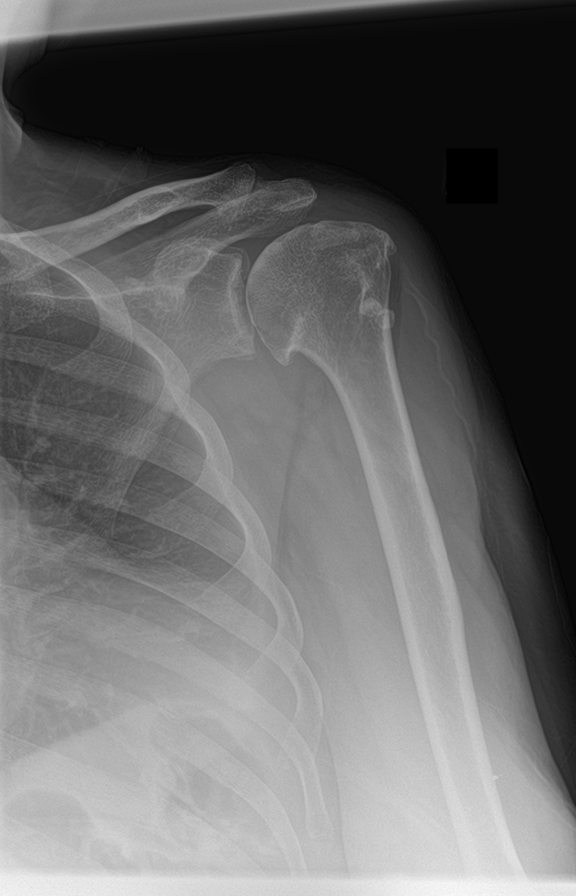

[humerus ap (1 of 2)]
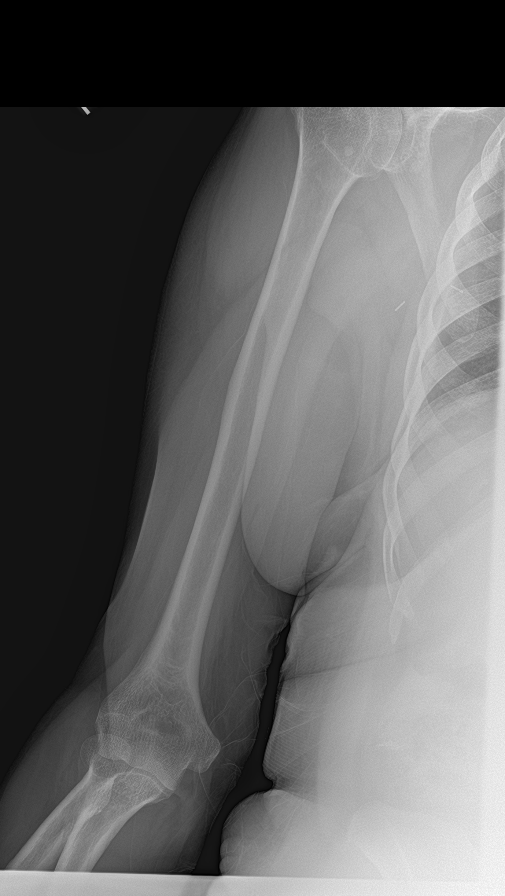

[humerus ap (2 of 2)]
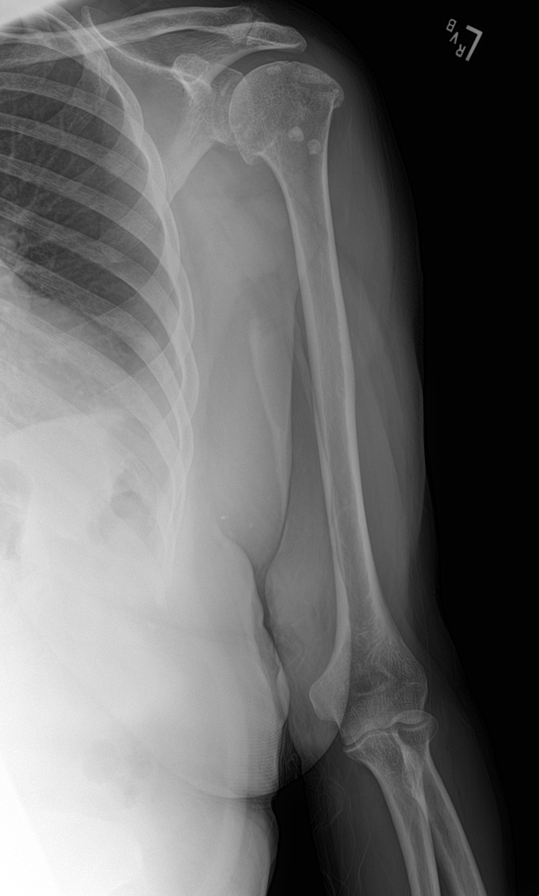

[forearm ap (1 of 2)]
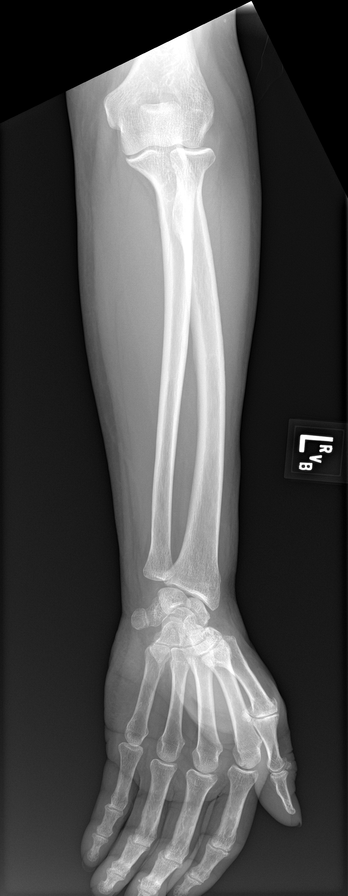

[forearm ap (2 of 2)]
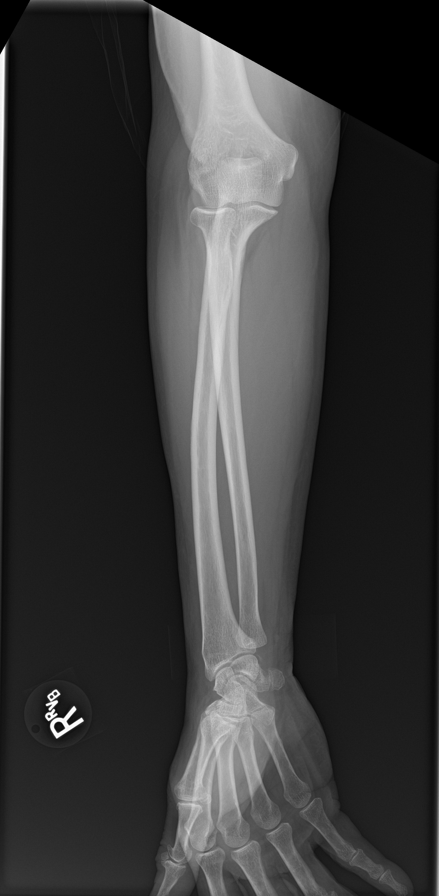

[c-spine ap]
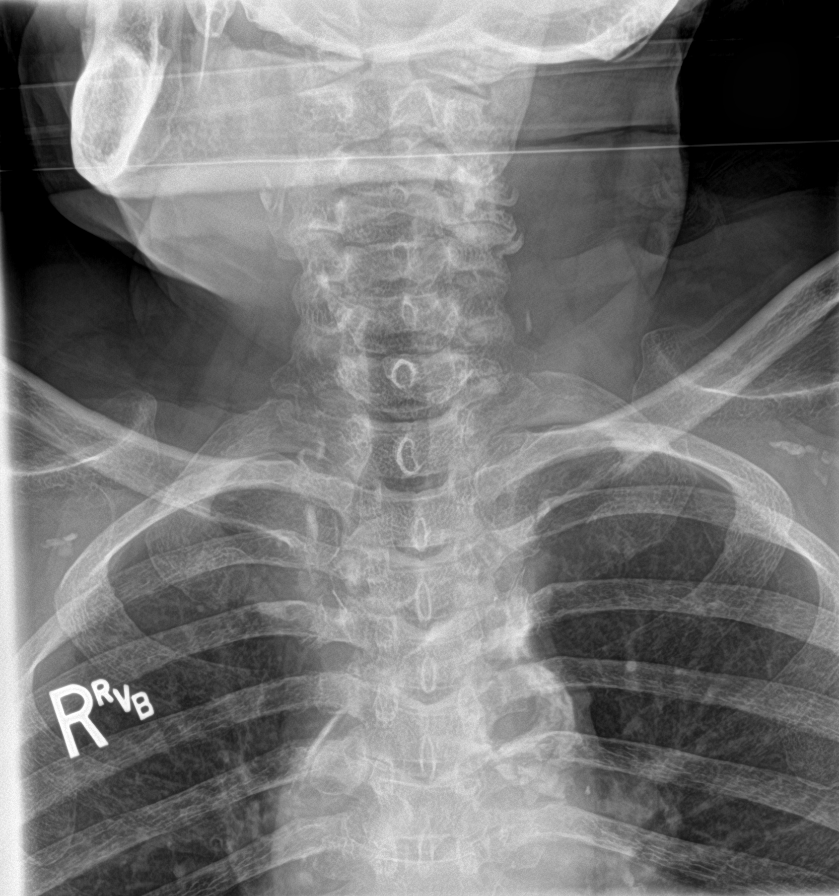

[t-spine ap]
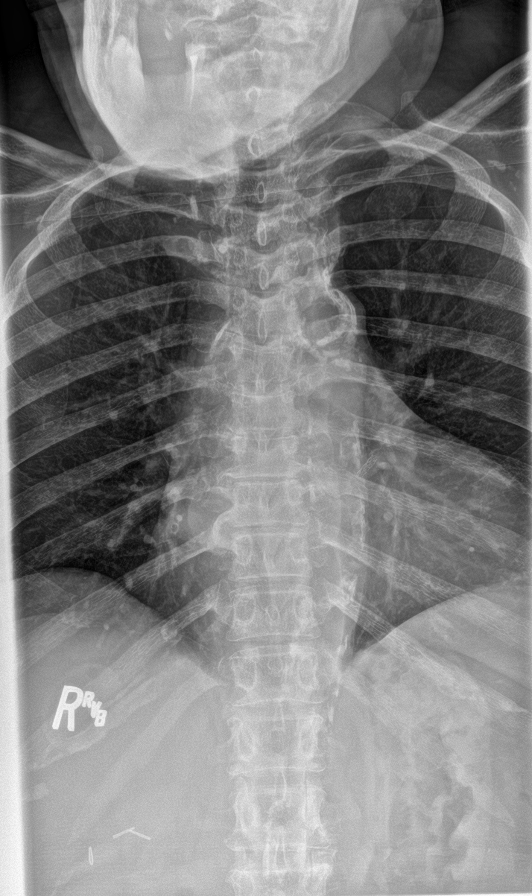

[t-spine lat]
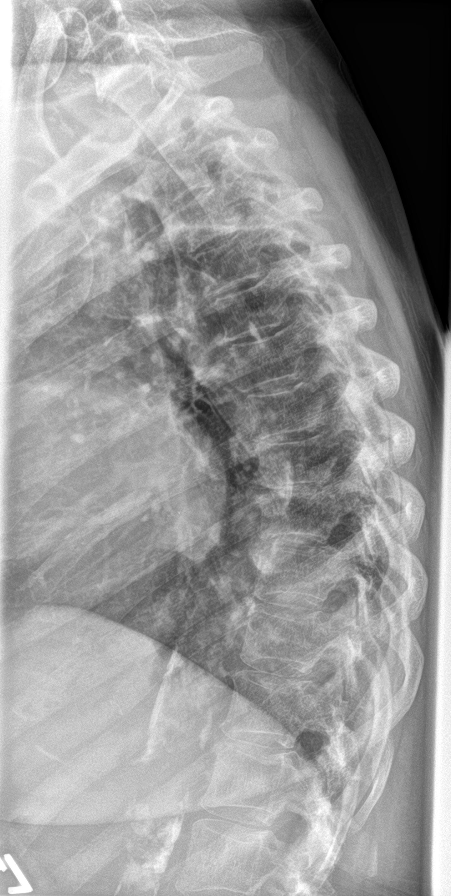

[10 of 10 positions shown; findings below may reference images not displayed]

FINDINGS: No focal lytic lesions are seen. There are no focal abnormalities in
the lung fields. Transverse diameter of heart is increased. Fairly
extensive arterial calcifications are seen. Degenerative changes are
noted in cervical, thoracic and lumbar spines. Degenerative changes
are noted in both shoulders. Surgical clips are seen in right
axilla. Hearing aids are noted on both sides.
IMPRESSION: There are no focal lytic or sclerotic lesions in the bony
structures. Other findings as described in the body of the report.

## 2023-08-30 NOTE — Progress Notes (Signed)
 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 Her R hand is slightly worse than her L hand   Pain: Pain Assessment Pain Assessment: 0-10 Pain Score  : 6 Pain Location: Finger (Comment which one) Pain Orientation: Right, Left Clinical Progression: Gradually worsening Pain Intervention(s): Rest   OBJECTIVE  General Observation/Objective Findings:  Pt arrived to therapy on time with no signs of distress. Pt completed hand hygiene upon arrival and consented to all therapeutic intervention.      Interventions:  Hot pack: 10 minutes  Hot pack was placed around bilateral hands with one layer of toweling as a barrier to increase tissue temperature, promoting blood flow, tissue extensibility, and overall healing.    Modalities/Orthotic Skin Integrity Assessment:   Modalities Performed per interventions above.  Patient was informed of risks and benefits to treatment today.  Skin integrity prior to treatment:   Intact.  Skin integrity after treatment:   Intact.  Therapeutic activity: 30 minutes  --Towel scrunch - patient participated in towel scrunching exercise to address mobility of the affected digits. The patient scrunches the towel by flexing the fingers and then unscrunches the towel by extending the fingers, addressing mobility as well as sensory stimulation with the hand towel textures.  This activity simulates ability to grip and fold  laundry.    --Sponge deposit- pt gathered several small sponges in affected hand and used palmar translation to the thumb and IF to deposit them one at a time on to the table. This activity works on Pharmacist, community and digit ROM needed for activities such as Tree surgeon.   --Sponge - Pt participated in gripping, pinching, and pressing activity using a low resistance sponge. This activity helps to strengthen the hand muscles to improve participation in daily activities such as self-care, dressing, and meal preparation.    --Chinese balls- pt used chinese balls and rotated them using digits and thumb of affected hand. Pt competed this activity by rotating balls on the table with forearm pronated. This activity help work on AROM of the digits to improve participation in activities such as self-care.   All activities performed bilaterally     Education:  Yes, as described in interventions    ASSESSMENT Pt tolerated therapeutic intervention well today with no complaints of pain or excessive difficulty. Pt reports that she continue to have severe pain with any passive motion. Session focused on gentle active motion and very light strengthening which was tolerated well. Pt reported a painful snapping sensation when squeezing a low resistance sponge with full composite flexion. Pt was advised to squeeze the sponge in a mid-arc of motion. The helped relieve snapping. Pt reports compliance with HEP which helps to facilitate progress at home and during therapy. Pt will continue to benefit from skilled OT to address decreased AROM, strength, and increased pain limiting participation in ADLs, IADLs, and work tasks.   Therapy Diagnosis:     ICD-10-CM   1. Pain in  finger of both hands  M79.645, M79.644   2. Decreased range of motion of fingers of both hands  M25.641, M25.642   3. Decreased grip strength  R29.898      Progress Towards Goals:   Progress toward goals was not addressed today.     Goals Addressed             This Visit's Progress   . OT Goal        Short Term Goals (STG) - Time Frame: 2 visits   STG 1: Patient will demonstrate compliance and understanding to HEP by completing program 2x/daily as measured by increase in overall function.     Long Term Goals (LTG) - Time Frame: 4 visits   LTG 1: Pt's pain will decrease from 7/10 to 5/10 to allow for increased participation in ADLs, IADLs, and work/leisure tasks.     LTG 2: Pt's Quick Dash score will decrease from 38.64 to 25 indicating increased functional use of affected hand and upper extremity with daily ADL/IADLs such as washing back, meal prep, and opening a jar.      LTG 3: Pt. TAM will increase by 10 or more in digits 2-5 bilaterally to facilitate greater ROM needed for daily tasks such as holding a toothbrush, cutting food, and grasping a washcloth.     LTG 4:   LTG 5:         PLAN Treatment Frequency and Duration:  Treatment Plan Details: 1x/wk for 4 visits  Recommended OT Treatment/Interventions: Manual therapy (02859); Neuromuscular re-education [phone]); Therapeutic activity (97530); Therapeutic exercise (97110); Selfcare/ADL Training (02464); Ultrasound (02964); Fluidotherapy [phone]); Paraffin (97018); Self-care/home management training (02464); Orthotic fitting/training (02239)   Recommended Consults:  None currently  Development of Plan of Care:  No change in POC.  Total Treatment Time (Time & Untimed): Total treatment minutes: 40 Total Time in Timed Codes: Timed Code Minutes: 30     Treatment/Procedure Therapeutic Actvity Direct Pt Contact minutes: 30         The patient has been instructed to contact our clinic if any questions or problems should arise.

## 2023-09-12 NOTE — Unmapped External Note (Signed)
  [facility]  -  OCCUPATIONAL THERAPY [place] [address] [place] [address] [phone] Fax: [phone]   PHYSICIAN SIGNATURE REQUIRED  Thank you for your referral. Please review the following Plan of Care and sign electronically, or fax signed paper copy to: [phone].   Please call Dept: [phone] with any questions or concerns.  Josephine Rose Barrier [place], OTR/L  Occupational Therapy Plan of Care  Recertification Diagnosis:  1. Pain in finger of both hands      2. Decreased range of motion of fingers of both hands      3. Decreased grip strength              Plan and Recommendations: Treatment Interventions: Manual therapy (02859); Neuromuscular re-education [phone]); Therapeutic activity (97530); Therapeutic exercise (97110); Selfcare/ADL Training (02464); Ultrasound (02964); Fluidotherapy [phone]); Paraffin (97018); Self-care/home management training (02464); Orthotic fitting/training (02239) Treatment Plan Details: 1x/wk for 4 visits   Goals:  Goals Addressed             This Visit's Progress   . OT Goal        Short Term Goals (STG) - Time Frame: 2 visits   STG 1: Patient will demonstrate compliance and understanding to HEP by completing program 2x/daily as measured by increase in overall function.  8/7: met    Long Term Goals (LTG) - Time Frame: 8 visits   LTG 1: Pt's pain will decrease from 7/10 to 5/10 to allow for increased participation in ADLs, IADLs, and work/leisure tasks.  8/7: not met, pain report is frequently more that 5/10    LTG 2: Pt's Quick Dash score will decrease from 38.64 to 25 indicating increased functional use of affected hand and upper extremity with daily ADL/IADLs such as washing back, meal prep, and opening a jar.    8/7: not met, 79.55   LTG 3: Pt. TAM will increase by 10 or more in digits 2-5 bilaterally to facilitate greater ROM needed for daily tasks such as holding a  toothbrush, cutting food, and grasping a washcloth.   8/7: not met   LTG 4:   LTG 5:         Certification: This is to certify that the above named patient, who is under my care, requires skilled Therapy services as described in the above treatment plan. I further certify that the services outlined in this plan are skilled and medically necessary. I have reviewed this plan for rehabilitation services, and I recommend that these services continue to meet the above stated goals and plan.

## 2023-09-12 NOTE — Unmapped External Note (Signed)
 Occupational Therapy Progress Note Report  Payor: UHC MEDICARE ADV / Plan: UHC MA DUAL COMPLETE RPPO-SNP / Product Type: Medicare Advantage /   Demographics:  Age: 73 y.o.  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 Pain in finger of both hands  M79.645, M79.644   2. Decreased range of motion of fingers of both hands  M25.641, M25.642   3. Decreased grip strength  R29.898    Rehabilitation Precautions/Restrictions:   Precautions/Restrictions Precautions: ROM to tolerance Restrictions: activity to tolerance     Problem List: Assessment Impairment List: Functional limitations; IADLs; Pain limiting function/lifestyle; Patient/family education needs; Upper extremity range of motion; Upper extremity strength; Joint stiffness    SUBJECTIVE Pt reports that she started having tingling yesterday, intermittent.       Pain:  Pain Assessment Pain Assessment: 0-10 Pain Score  : 5 Pain Location: Finger (Comment which one) Pain Orientation: Right, Left Clinical Progression: Not changed Pain Intervention(s): Rest  OBJECTIVE  General Observation:  Pt arrived to therapy 15 minutes lat due to transportation with no signs of distress. Pt completed hand hygiene upon arrival and consented to all therapeutic intervention.       AROM Right Digits  7/7 MP Ext/Flex PIP Ext/Flex DIP Ext/Flex TAM  Index 0/65 0/76 0/25 166  Long 0/69 -54/75 -12/34 112  Ring 0/70 -45/95 0/30 150  Small 0/77 -45/81 -9/47 151     AROM Right Digits  8/7 MP Ext/Flex PIP Ext/Flex DIP Ext/Flex TAM  Index 0/36 0/73 0/34  143  Long 0/39 -55/83 -16/46 97  Ring 0/64 -43/90 -4/39 146  Small 0/76 -39/68 -10/40 135     AROM Left Digits  7/7 MP Ext/Flex PIP Ext/Flex DIP Ext/Flex TAM  Index -55/73 -17/68 -14/33 88  Long -45/72 -28/58 -9/32 80  Ring -43/68 -10/68 0/41 124  Small -32/87 -22/54 0/41 128     AROM Left Digits  8/7 MP Ext/Flex PIP Ext/Flex DIP Ext/Flex TAM  Index -34/87 0/90 0/41 184  Long -17/80 -23/70 -838/ 182  Ring -16/87 0/80 0/40 191  Small -5/89 -11/80 0/50 203    Outcomes:  OT Outcome Measures    Flowsheet Row Value  Quick Dash-Activities   Open a tight or new jar 5  Do heavy household chores (wash walls, wash floors) 5  Carry a shopping bag or briefcase 5  Wash your back 5  Use knife to cut food 4  Recreational activities in which you take some force or impact through your arm, shoulder or hand (golf, hammering, tennis) 4  During the past week, to what extent has your arm, shoulder or hand roblem interfered with your normal social activities with family, friends, neighbors, or groups? 4  During the past week, to what extent were you limited in your work or other regular daily activities as a result of your arm, shoulder, or hand problem 4  Arm, shoulder, or hand pain 4  Tingling (pins and needles) in your arm, shoulder, or hand 2  Duting the past week, how much difficulty have you had sleeping because of the pain in your  arm, shoulder or hand? 4  Quick DASH Score 79.55         Interventions:  Therapeutic exercise: 18 minutes  --AROM measurements of the bilateral digits was taken. These measurements were taken to evaluate overall progress throughout therapy program. Progress was discussed with the patient.    Therapeutic activity: 12 minutes  --Sponge deposit- pt gathered several small sponges in affected hand and used palmar translation to the thumb and IF to deposit them one at a time on to the table. This activity works on Pharmacist, community and digit ROM needed  for activities such as Tree surgeon.   --Sponge - Pt participated in gripping, pinching, and pressing activity using a low resistance sponge. This activity helps to strengthen the hand muscles to improve participation in daily activities such as self-care, dressing, and meal preparation.      Education:  Yes, as described in interventions     ASSESSMENT Pt is working toward goals in functional use, ROM, pain in bilateral hands. Pt continues to work on regaining AROM of digits. Measurements may be inaccurate as patient was unable to hold an active end-range position as requested when attempting to obtain measurements. Pt continues to have significant increase in pain with very light attempt at PROM. Pt's pain decreased to 5/10 today, but reports no improvement in pain overall. Pt's self-report of disability on QuickDASH score increased from 38.64% to 79.55% which may be due to interpretation/understanding the questions. Pt does not reports feeling worse. Pt reports no changes in her daily activity, though she feels she is benefiting from therapy. Pt was able to tolerate therapeutic session, with continuation of ROM and strengthening activities. Pt will continue to benefit from skilled OT once weekly to address decreased AROM, strength, and increased pain limiting participation in ADLs, IADLs, and work/leisure tasks.   Pt may have more benefit from education of activity modification and adaptive devices.  Dr Murrell will be consulted regarding progress with therapy to discuss need to continued care   Therapy Program to Date: In summary, the therapy program has included: A/AA/PROM, HEP, Therapeutic Exercise, Therapeutic Activity, Self-care/Home Management, and Manual therapy.     Summary of Care Provided during this Interval: see above  Responses to Treatment Interventions: Additional improvement anticipated within reasonable timeframe  Progress Towards Goals: met STG, working on LTGs  Goals  Addressed             This Visit's Progress   . OT Goal        Short Term Goals (STG) - Time Frame: 2 visits   STG 1: Patient will demonstrate compliance and understanding to HEP by completing program 2x/daily as measured by increase in overall function.  8/7: met    Long Term Goals (LTG) - Time Frame: 8 visits   LTG 1: Pt's pain will decrease from 7/10 to 5/10 to allow for increased participation in ADLs, IADLs, and work/leisure tasks.  8/7: not met, pain report is frequently more that 5/10    LTG 2: Pt's Quick Dash score will decrease from 38.64 to 25 indicating increased functional use of affected hand and upper extremity with daily ADL/IADLs such as washing back, meal prep, and opening a jar.    8/7: not met, 79.55   LTG 3: Pt. TAM will increase by 10 or more in digits 2-5 bilaterally to facilitate greater ROM needed for daily tasks such as holding a toothbrush, cutting food, and grasping a washcloth.   8/7: not met  LTG 4:   LTG 5:         PLAN Treatment Frequency and Duration:  Treatment Plan Details: 1x/wk for 4 more visits  Recommended OT Treatment/Interventions: Manual therapy (02859); Neuromuscular re-education [phone]); Therapeutic activity (97530); Therapeutic exercise (97110); Selfcare/ADL Training (02464); Ultrasound (02964); Fluidotherapy [phone]); Paraffin (97018); Self-care/home management training (02464)   Recommended Consults:  None currently  Development of Plan of Care:  Patient participated in plan of care development.  Total Treatment Time (Time & Untimed): Total Treatment Minutes: 30 Total Time in Timed Codes: Timed Code Minutes: 30     Treatment/Procedure Therapeutic Actvity Direct Pt Contact minutes: 12 $Therapeutic Exercises minutes: 18           The patient has been instructed to contact our clinic if any questions or problems should arise.

## 2023-09-26 ENCOUNTER — Emergency Department (HOSPITAL_COMMUNITY)

## 2023-09-26 ENCOUNTER — Encounter (HOSPITAL_COMMUNITY): Payer: Self-pay

## 2023-09-26 ENCOUNTER — Other Ambulatory Visit: Payer: Self-pay

## 2023-09-26 ENCOUNTER — Observation Stay (HOSPITAL_COMMUNITY)
Admission: EM | Admit: 2023-09-26 | Discharge: 2023-09-28 | Disposition: A | Discharge status: Home / Self Care | Attending: Emergency Medicine | Admitting: Emergency Medicine

## 2023-09-26 ENCOUNTER — Inpatient Hospital Stay (HOSPITAL_COMMUNITY)

## 2023-09-26 DIAGNOSIS — I129 Hypertensive chronic kidney disease with stage 1 through stage 4 chronic kidney disease, or unspecified chronic kidney disease: Secondary | ICD-10-CM | POA: Diagnosis not present

## 2023-09-26 DIAGNOSIS — N179 Acute kidney failure, unspecified: Secondary | ICD-10-CM | POA: Insufficient documentation

## 2023-09-26 DIAGNOSIS — R739 Hyperglycemia, unspecified: Secondary | ICD-10-CM | POA: Diagnosis present

## 2023-09-26 DIAGNOSIS — E871 Hypo-osmolality and hyponatremia: Secondary | ICD-10-CM | POA: Insufficient documentation

## 2023-09-26 DIAGNOSIS — J441 Chronic obstructive pulmonary disease with (acute) exacerbation: Secondary | ICD-10-CM | POA: Insufficient documentation

## 2023-09-26 DIAGNOSIS — Z79899 Other long term (current) drug therapy: Secondary | ICD-10-CM | POA: Diagnosis not present

## 2023-09-26 DIAGNOSIS — N184 Chronic kidney disease, stage 4 (severe): Secondary | ICD-10-CM | POA: Insufficient documentation

## 2023-09-26 DIAGNOSIS — R112 Nausea with vomiting, unspecified: Principal | ICD-10-CM | POA: Insufficient documentation

## 2023-09-26 DIAGNOSIS — E1165 Type 2 diabetes mellitus with hyperglycemia: Principal | ICD-10-CM | POA: Insufficient documentation

## 2023-09-26 DIAGNOSIS — H538 Other visual disturbances: Secondary | ICD-10-CM | POA: Insufficient documentation

## 2023-09-26 DIAGNOSIS — F14920 Cocaine use, unspecified with intoxication, uncomplicated: Secondary | ICD-10-CM | POA: Insufficient documentation

## 2023-09-26 DIAGNOSIS — R1114 Bilious vomiting: Secondary | ICD-10-CM | POA: Diagnosis not present

## 2023-09-26 DIAGNOSIS — I2510 Atherosclerotic heart disease of native coronary artery without angina pectoris: Secondary | ICD-10-CM | POA: Insufficient documentation

## 2023-09-26 DIAGNOSIS — E1122 Type 2 diabetes mellitus with diabetic chronic kidney disease: Secondary | ICD-10-CM | POA: Diagnosis not present

## 2023-09-26 DIAGNOSIS — E118 Type 2 diabetes mellitus with unspecified complications: Secondary | ICD-10-CM

## 2023-09-26 LAB — BETA-HYDROXYBUTYRIC ACID
Beta-Hydroxybutyric Acid: 1.32 mmol/L — ABNORMAL HIGH (ref 0.05–0.27)
Beta-Hydroxybutyric Acid: 0.61 mmol/L — ABNORMAL HIGH (ref 0.05–0.27)
Beta-Hydroxybutyric Acid: 0.11 mmol/L (ref 0.05–0.27)

## 2023-09-26 LAB — CBC WITH DIFFERENTIAL/PLATELET
Abs Immature Granulocytes: 0.03 K/uL (ref 0.00–0.07)
Basophils Absolute: 0 K/uL (ref 0.0–0.1)
Basophils Relative: 1 %
Eosinophils Absolute: 0 K/uL (ref 0.0–0.5)
Eosinophils Relative: 0 %
HCT: 37.6 % (ref 36.0–46.0)
Hemoglobin: 12.5 g/dL (ref 12.0–15.0)
Immature Granulocytes: 1 %
Lymphocytes Relative: 20 %
Lymphs Abs: 0.9 K/uL (ref 0.7–4.0)
MCH: 28.6 pg (ref 26.0–34.0)
MCHC: 33.2 g/dL (ref 30.0–36.0)
MCV: 86 fL (ref 80.0–100.0)
Monocytes Absolute: 0.5 K/uL (ref 0.1–1.0)
Monocytes Relative: 10 %
Neutro Abs: 3.2 K/uL (ref 1.7–7.7)
Neutrophils Relative %: 68 %
Platelets: 312 K/uL (ref 150–400)
RBC: 4.37 MIL/uL (ref 3.87–5.11)
RDW: 12.2 % (ref 11.5–15.5)
WBC: 4.7 K/uL (ref 4.0–10.5)
nRBC: 0 % (ref 0.0–0.2)

## 2023-09-26 LAB — GLUCOSE, CAPILLARY
Glucose-Capillary: >600 mg/dL (ref 70–99)
Glucose-Capillary: 396 mg/dL — ABNORMAL HIGH (ref 70–99)
Glucose-Capillary: 556 mg/dL (ref 70–99)
Glucose-Capillary: 275 mg/dL — ABNORMAL HIGH (ref 70–99)
Glucose-Capillary: 210 mg/dL — ABNORMAL HIGH (ref 70–99)
Glucose-Capillary: 172 mg/dL — ABNORMAL HIGH (ref 70–99)

## 2023-09-26 LAB — COMPREHENSIVE METABOLIC PANEL WITH GFR
ALT: 15 U/L (ref 0–44)
AST: 14 U/L — ABNORMAL LOW (ref 15–41)
Albumin: 3.7 g/dL (ref 3.5–5.0)
Alkaline Phosphatase: 144 U/L — ABNORMAL HIGH (ref 38–126)
Anion gap: 16 — ABNORMAL HIGH (ref 5–15)
BUN: 38 mg/dL — ABNORMAL HIGH (ref 8–23)
CO2: 18 mmol/L — ABNORMAL LOW (ref 22–32)
Calcium: 9.2 mg/dL (ref 8.9–10.3)
Chloride: 88 mmol/L — ABNORMAL LOW (ref 98–111)
Creatinine, Ser: 2.65 mg/dL — ABNORMAL HIGH (ref 0.44–1.00)
GFR, Estimated: 18 mL/min — ABNORMAL LOW (ref >60)
Glucose, Bld: 1106 mg/dL (ref 70–99)
Potassium: 4.9 mmol/L (ref 3.5–5.1)
Sodium: 122 mmol/L — ABNORMAL LOW (ref 135–145)
Total Bilirubin: 1.5 mg/dL — ABNORMAL HIGH (ref 0.0–1.2)
Total Protein: 7.5 g/dL (ref 6.5–8.1)

## 2023-09-26 LAB — BASIC METABOLIC PANEL WITH GFR
Anion gap: 13 (ref 5–15)
Anion gap: 13 (ref 5–15)
BUN: 37 mg/dL — ABNORMAL HIGH (ref 8–23)
BUN: 34 mg/dL — ABNORMAL HIGH (ref 8–23)
CO2: 20 mmol/L — ABNORMAL LOW (ref 22–32)
CO2: 20 mmol/L — ABNORMAL LOW (ref 22–32)
Calcium: 9.3 mg/dL (ref 8.9–10.3)
Calcium: 9.8 mg/dL (ref 8.9–10.3)
Chloride: 89 mmol/L — ABNORMAL LOW (ref 98–111)
Chloride: 99 mmol/L (ref 98–111)
Creatinine, Ser: 2.55 mg/dL — ABNORMAL HIGH (ref 0.44–1.00)
Creatinine, Ser: 2.42 mg/dL — ABNORMAL HIGH (ref 0.44–1.00)
GFR, Estimated: 19 mL/min — ABNORMAL LOW (ref >60)
GFR, Estimated: 20 mL/min — ABNORMAL LOW (ref >60)
Glucose, Bld: 919 mg/dL (ref 70–99)
Glucose, Bld: 369 mg/dL — ABNORMAL HIGH (ref 70–99)
Potassium: 4.4 mmol/L (ref 3.5–5.1)
Potassium: 3.5 mmol/L (ref 3.5–5.1)
Sodium: 122 mmol/L — ABNORMAL LOW (ref 135–145)
Sodium: 132 mmol/L — ABNORMAL LOW (ref 135–145)

## 2023-09-26 LAB — URINALYSIS, ROUTINE W REFLEX MICROSCOPIC
Bilirubin Urine: NEGATIVE
Glucose, UA: >=500 mg/dL — AB
Ketones, ur: NEGATIVE mg/dL
Nitrite: NEGATIVE
Protein, ur: NEGATIVE mg/dL
Specific Gravity, Urine: 1.02 (ref 1.005–1.030)
pH: 6 (ref 5.0–8.0)

## 2023-09-26 LAB — TROPONIN I (HIGH SENSITIVITY)
Troponin I (High Sensitivity): 15 ng/L (ref <18)
Troponin I (High Sensitivity): 17 ng/L (ref <18)

## 2023-09-26 LAB — TSH: TSH: 0.464 u[IU]/mL (ref 0.350–4.500)

## 2023-09-26 LAB — I-STAT VENOUS BLOOD GAS, ED
Acid-base deficit: 3 mmol/L — ABNORMAL HIGH (ref 0.0–2.0)
Bicarbonate: 21 mmol/L (ref 20.0–28.0)
Calcium, Ion: 1.1 mmol/L — ABNORMAL LOW (ref 1.15–1.40)
HCT: 41 % (ref 36.0–46.0)
Hemoglobin: 13.9 g/dL (ref 12.0–15.0)
O2 Saturation: 99 %
Potassium: 4.8 mmol/L (ref 3.5–5.1)
Sodium: 119 mmol/L — CL (ref 135–145)
TCO2: 22 mmol/L (ref 22–32)
pCO2, Ven: 35 mmHg — ABNORMAL LOW (ref 44–60)
pH, Ven: 7.387 (ref 7.25–7.43)
pO2, Ven: 120 mmHg — ABNORMAL HIGH (ref 32–45)

## 2023-09-26 LAB — RAPID URINE DRUG SCREEN, HOSP PERFORMED
Amphetamines: NOT DETECTED
Barbiturates: NOT DETECTED
Benzodiazepines: NOT DETECTED
Cocaine: NOT DETECTED
Opiates: NOT DETECTED
Tetrahydrocannabinol: NOT DETECTED

## 2023-09-26 LAB — LIPASE, BLOOD: Lipase: 58 U/L — ABNORMAL HIGH (ref 11–51)

## 2023-09-26 LAB — CBG MONITORING, ED: Glucose-Capillary: >600 mg/dL (ref 70–99)

## 2023-09-26 LAB — MAGNESIUM: Magnesium: 2 mg/dL (ref 1.7–2.4)

## 2023-09-26 LAB — OSMOLALITY: Osmolality: 329 mosm/kg (ref 275–295)

## 2023-09-26 LAB — T4, FREE: Free T4: 1.32 ng/dL — ABNORMAL HIGH (ref 0.61–1.12)

## 2023-09-26 LAB — PROCALCITONIN: Procalcitonin: <0.1 ng/mL

## 2023-09-26 MED ORDER — ALBUTEROL SULFATE (2.5 MG/3ML) 0.083% IN NEBU: 2.5000 mg | INHALATION_SOLUTION | Freq: Four times a day (QID) | RESPIRATORY_TRACT | Status: DC | PRN

## 2023-09-26 MED ORDER — DEXTROSE 50 % IV SOLN: 0.0000 mL | INTRAVENOUS | Status: DC | PRN

## 2023-09-26 MED ORDER — ANASTROZOLE 1 MG PO TABS
1.0000 mg | ORAL_TABLET | Freq: Every day | ORAL | Status: DC
  Administered 2023-09-27 – 2023-09-28 (×2): 1 mg via ORAL
  Filled 2023-09-26 (×2): qty 1

## 2023-09-26 MED ORDER — LACTATED RINGERS IV SOLN: INTRAVENOUS | Status: DC

## 2023-09-26 MED ORDER — SODIUM CHLORIDE 0.9 % IV BOLUS
500.0000 mL | Freq: Once | INTRAVENOUS | Status: AC
  Administered 2023-09-26: 500 mL via INTRAVENOUS

## 2023-09-26 MED ORDER — SODIUM CHLORIDE 0.9 % IV SOLN: INTRAVENOUS | Status: AC

## 2023-09-26 MED ORDER — DEXTROSE IN LACTATED RINGERS 5 % IV SOLN: INTRAVENOUS | Status: DC

## 2023-09-26 MED ORDER — DULOXETINE HCL 30 MG PO CPEP
30.0000 mg | ORAL_CAPSULE | Freq: Every day | ORAL | Status: DC
  Administered 2023-09-27 – 2023-09-28 (×2): 30 mg via ORAL
  Filled 2023-09-26 (×2): qty 1

## 2023-09-26 MED ORDER — ATORVASTATIN CALCIUM 40 MG PO TABS
20.0000 mg | ORAL_TABLET | Freq: Every day | ORAL | Status: DC
  Administered 2023-09-27 – 2023-09-28 (×2): 20 mg via ORAL
  Filled 2023-09-26 (×2): qty 1

## 2023-09-26 MED ORDER — INSULIN ASPART 100 UNIT/ML IJ SOLN
2.0000 [IU] | Freq: Three times a day (TID) | INTRAMUSCULAR | Status: DC
  Administered 2023-09-27 (×2): 2 [IU] via SUBCUTANEOUS

## 2023-09-26 MED ORDER — INSULIN REGULAR(HUMAN) IN NACL 100-0.9 UT/100ML-% IV SOLN
INTRAVENOUS | Status: DC
  Administered 2023-09-26: 8 [IU]/h via INTRAVENOUS
  Filled 2023-09-26: qty 100

## 2023-09-26 MED ORDER — POLYETHYLENE GLYCOL 3350 17 G PO PACK
17.0000 g | PACK | Freq: Every day | ORAL | Status: DC
  Administered 2023-09-27 – 2023-09-28 (×2): 17 g via ORAL
  Filled 2023-09-26 (×2): qty 1

## 2023-09-26 MED ORDER — FLUTICASONE PROPIONATE 50 MCG/ACT NA SUSP
2.0000 | Freq: Every day | NASAL | Status: DC
  Administered 2023-09-26 – 2023-09-28 (×3): 2 via NASAL
  Filled 2023-09-26: qty 16

## 2023-09-26 MED ORDER — SODIUM CHLORIDE 0.9 % IV BOLUS
500.0000 mL | Freq: Once | INTRAVENOUS | Status: AC
Start: 2023-09-26 — End: 2023-09-26
  Administered 2023-09-26: 500 mL via INTRAVENOUS

## 2023-09-26 MED ORDER — HEPARIN SODIUM (PORCINE) 5000 UNIT/ML IJ SOLN
5000.0000 [IU] | Freq: Three times a day (TID) | INTRAMUSCULAR | Status: DC
  Administered 2023-09-26 – 2023-09-28 (×6): 5000 [IU] via SUBCUTANEOUS
  Filled 2023-09-26 (×6): qty 1

## 2023-09-26 MED ORDER — GUAIFENESIN-DM 100-10 MG/5ML PO SYRP
5.0000 mL | ORAL_SOLUTION | ORAL | Status: DC | PRN
  Administered 2023-09-26: 5 mL via ORAL
  Filled 2023-09-26: qty 5

## 2023-09-26 MED ORDER — INSULIN GLARGINE-YFGN 100 UNIT/ML ~~LOC~~ SOLN
7.0000 [IU] | Freq: Every day | SUBCUTANEOUS | Status: DC
  Administered 2023-09-26: 7 [IU] via SUBCUTANEOUS
  Filled 2023-09-26 (×2): qty 0.07

## 2023-09-26 MED ORDER — INSULIN ASPART 100 UNIT/ML IJ SOLN
10.0000 [IU] | Freq: Once | INTRAMUSCULAR | Status: AC
  Administered 2023-09-26: 10 [IU] via SUBCUTANEOUS

## 2023-09-26 MED ORDER — PANTOPRAZOLE SODIUM 40 MG IV SOLR
40.0000 mg | Freq: Two times a day (BID) | INTRAVENOUS | Status: DC
  Administered 2023-09-26 – 2023-09-28 (×4): 40 mg via INTRAVENOUS
  Filled 2023-09-26 (×4): qty 10

## 2023-09-26 MED ORDER — INSULIN ASPART 100 UNIT/ML IJ SOLN: 0.0000 [IU] | Freq: Three times a day (TID) | INTRAMUSCULAR | Status: DC

## 2023-09-26 MED ORDER — OXYCODONE-ACETAMINOPHEN 5-325 MG PO TABS: 1.0000 | ORAL_TABLET | Freq: Four times a day (QID) | ORAL | Status: AC | PRN

## 2023-09-26 MED ORDER — NITROGLYCERIN 0.4 MG SL SUBL: 0.4000 mg | SUBLINGUAL_TABLET | SUBLINGUAL | Status: DC | PRN

## 2023-09-26 NOTE — ED Triage Notes (Signed)
 Pt. Arrived via [facility] from home with complaints of weakness, fatigue, and shakiness; Pt. Reports not being able to eat or drink anything due to unable to keep it down; Has a low grade fever and high blood sugar per [facility]  BP: 168/82 HR: 108 RR: 22 O2: 98% Temp: 99.0

## 2023-09-26 NOTE — ED Notes (Signed)
 Patient transported to CT

## 2023-09-26 NOTE — ED Provider Notes (Signed)
 [place] [facility] Provider Note   CSN: [id number] Arrival date & time: 09/26/23  1439     Patient presents with: Hyperglycemia   [name] is a 73 y.o. female past medical history significant for CKD stage IV, CAD status post PCI/DES, breast cancer status postmastectomy, hypertension who presents emergency department for generalized weakness and fatigue.  Patient states that for approximately 1 week she has been unable to eat or drink anything secondary to nausea, vomiting and abdominal pain.  Patient states he does have a history of diabetes however is a poor historian and is unable to state medications that she is taking for her comorbidities or diabetes.    Hyperglycemia      Prior to Admission medications   Medication Sig Start Date End Date Taking? Authorizing Provider  acetaminophen  (TYLENOL ) 650 MG CR tablet Take 650 mg by mouth every 8 (eight) hours as needed for pain.    [provider]  albuterol  (PROVENTIL ) (2.5 MG/3ML) 0.083% nebulizer solution Take 3 mLs (2.5 mg total) by nebulization every 6 (six) hours as needed for wheezing or shortness of breath. 08/02/21   Vivienne Delon HERO, PA-C  albuterol  (VENTOLIN  HFA) 108 (90 Base) MCG/ACT inhaler Inhale 1-2 puffs into the lungs every 6 (six) hours as needed for wheezing or shortness of breath. 09/14/21   Danton Jon HERO, PA-C  allopurinol  (ZYLOPRIM ) 100 MG tablet Take 100 mg by mouth daily.    [provider]  anastrozole  (ARIMIDEX ) 1 MG tablet TAKE ONE TABLET BY MOUTH ONCE DAILY 06/18/22   Odean Potts, MD  atorvastatin  (LIPITOR) 20 MG tablet Take 20 mg by mouth daily.    [provider]  Blood Pressure Monitor DEVI Please provide patient with insurance approved blood pressure monitor. I10.0 11/15/20   Fleming, Zelda W, NP  diclofenac  Sodium (VOLTAREN ) 1 % GEL Apply 1 Application topically 4 (four) times daily. Patient taking differently: Apply 1  Application topically in the morning and at bedtime. 09/13/21   Gudena, Vinay, MD  DULoxetine  (CYMBALTA ) 30 MG capsule Take 30 mg by mouth daily.    [provider]  feeding supplement, GLUCERNA SHAKE, (GLUCERNA SHAKE) LIQD Take 237 mLs by mouth 2 (two) times daily between meals. 06/02/23   Rosario Leatrice FERNS, MD  ferrous sulfate  325 (65 FE) MG tablet Take 1 tablet (325 mg total) by mouth daily with breakfast. 06/12/23   Danton Reyes DASEN, MD  fluticasone  (FLONASE ) 50 MCG/ACT nasal spray Place 2 sprays into both nostrils daily. 05/10/21   Newlin, Enobong, MD  gabapentin  (NEURONTIN ) 100 MG capsule Take 100-200 mg by mouth See admin instructions. Take one tablet by mouth in the morning and afternoon, then take 2 tablets every night per patient 03/12/22   [provider]  glipiZIDE  2.5 MG TABS Take 2.5 mg by mouth daily before breakfast. Patient not taking: Reported on 07/18/2023 06/02/23   Rosario Leatrice I, MD  hydrOXYzine  (ATARAX ) 25 MG tablet Take 50 mg by mouth at bedtime. 05/07/23   [provider]  Insulin  Pen Needle (EASY TOUCH PEN NEEDLES) 31G X 8 MM MISC Use to inject insulin  once daily and Victoza  once daily. Max of 2 pen needles a day. Must have office visit for refills 03/28/22   Newlin, Enobong, MD  methocarbamol  (ROBAXIN ) 500 MG tablet Take 1 tablet (500 mg total) by mouth 2 (two) times daily. 03/09/23   Prosperi, Christian H, PA-C  nitroGLYCERIN  (NITROSTAT ) 0.4 MG SL tablet Place 1 tablet (  0.4 mg total) under the tongue every 5 (five) minutes as needed for chest pain. 03/15/20   Fleming, Zelda W, NP  oxyCODONE -acetaminophen  (PERCOCET) 5-325 MG tablet Take 1 tablet by mouth every 4 (four) hours as needed for severe pain (pain score 7-10). 07/18/23 07/17/24  Danford, Lonni SQUIBB, MD  pantoprazole  (PROTONIX ) 40 MG tablet TAKE ONE TABLET BY MOUTH EVERY DAY FOR ACID REFLUX 11/02/21   Fleming, Zelda W, NP  polyethylene glycol (MIRALAX  / GLYCOLAX ) 17 g packet Take 17 g by mouth daily.  06/03/23   Rosario Leatrice FERNS, MD  Respiratory Therapy Supplies (NEBULIZER MASK ADULT) MISC 1 Units by Does not apply route daily as needed. ICD 10 J44.9 05/08/21   Fleming, Zelda W, NP  senna (SENOKOT) 8.6 MG TABS tablet Take 1 tablet (8.6 mg total) by mouth daily as needed for mild constipation or moderate constipation. 06/02/23   Rosario Leatrice FERNS, MD  SYMBICORT  160-4.5 MCG/ACT inhaler Inhale 2 puffs into the lungs in the morning and at bedtime. 02/28/22   [provider]    Allergies: Patient has no known allergies.    Review of Systems  Updated Vital Signs BP 102/61   Pulse 80   Temp 98.8 F (37.1 C) (Oral)   Resp 13   Ht 4' 11 (1.499 m)   Wt 64.7 kg   LMP  (LMP Unknown) Comment: tubal ligation  SpO2 99%   BMI 28.81 kg/m   Physical Exam Vitals reviewed.  Constitutional:      General: She is not in acute distress. HENT:     Head: Normocephalic.  Eyes:     General: No visual field deficit. Cardiovascular:     Rate and Rhythm: Normal rate.     Heart sounds: Normal heart sounds. No murmur heard. Pulmonary:     Effort: Pulmonary effort is normal. No tachypnea.     Breath sounds: No decreased breath sounds.  Abdominal:     General: There is no distension.     Palpations: Abdomen is soft.     Tenderness: There is generalized abdominal tenderness.     Hernia: No hernia is present.  Musculoskeletal:     Cervical back: Full passive range of motion without pain and normal range of motion.     Right lower leg: No edema.     Left lower leg: No edema.     Comments: Spontaneous movement of bilateral upper and lower extremities  Neurological:     Mental Status: She is alert.     Cranial Nerves: No dysarthria or facial asymmetry.     Sensory: Sensation is intact. No sensory deficit.     Motor: Motor function is intact. No weakness, tremor, abnormal muscle tone, seizure activity or pronator drift.     Comments: Oriented to person and place however not time   Psychiatric:        Behavior: Behavior is cooperative.     (all labs ordered are listed, but only abnormal results are displayed) Labs Reviewed  COMPREHENSIVE METABOLIC PANEL WITH GFR - Abnormal; Notable for the following components:      Result Value   Sodium 122 (*)    Chloride 88 (*)    CO2 18 (*)    Glucose, Bld 1,106 (*)    BUN 38 (*)    Creatinine, Ser 2.65 (*)    AST 14 (*)    Alkaline Phosphatase 144 (*)    Total Bilirubin 1.5 (*)    GFR, Estimated 18 (*)  Anion gap 16 (*)    All other components within normal limits  BETA-HYDROXYBUTYRIC ACID - Abnormal; Notable for the following components:   Beta-Hydroxybutyric Acid 1.32 (*)    All other components within normal limits  URINALYSIS, ROUTINE W REFLEX MICROSCOPIC - Abnormal; Notable for the following components:   Color, Urine STRAW (*)    Glucose, UA >=500 (*)    Hgb urine dipstick SMALL (*)    Leukocytes,Ua SMALL (*)    Bacteria, UA RARE (*)    All other components within normal limits  T4, FREE - Abnormal; Notable for the following components:   Free T4 1.32 (*)    All other components within normal limits  LIPASE, BLOOD - Abnormal; Notable for the following components:   Lipase 58 (*)    All other components within normal limits  CBG MONITORING, ED - Abnormal; Notable for the following components:   Glucose-Capillary >600 (*)    All other components within normal limits  I-STAT VENOUS BLOOD GAS, ED - Abnormal; Notable for the following components:   pCO2, Ven 35.0 (*)    pO2, Ven 120 (*)    Acid-base deficit 3.0 (*)    Sodium 119 (*)    Calcium , Ion 1.10 (*)    All other components within normal limits  CBC WITH DIFFERENTIAL/PLATELET  TSH  RAPID URINE DRUG SCREEN, HOSP PERFORMED  CBG MONITORING, ED  TROPONIN I (HIGH SENSITIVITY)  TROPONIN I (HIGH SENSITIVITY)    EKG: EKG Interpretation Date/Time:  Thursday September 26 2023 14:45:29 EDT Ventricular Rate:  95 PR Interval:  149 QRS  Duration:  87 QT Interval:  370 QTC Calculation: 466 R Axis:   -37  Text Interpretation: Sinus rhythm LAE, consider biatrial enlargement Left axis deviation No significant change since last tracing Confirmed by Dreama Longs ([id number]) on 09/26/2023 3:30:44 PM  Radiology: [facility] Chest Portable 1 View Result Date: 09/26/2023 CLINICAL DATA:  Productive cough. EXAM: PORTABLE CHEST 1 VIEW COMPARISON:  07/17/2023 FINDINGS: Poor inspiration. Stable borderline enlarged cardiac silhouette. Clear lungs with normal vascularity. Tortuous and partially calcified thoracic aorta. Left shoulder prosthesis. IMPRESSION: No acute abnormality. Electronically Signed   By: Elspeth Bathe M.D.   On: 09/26/2023 15:36     Procedures   Medications Ordered in the ED  sodium chloride  0.9 % bolus 500 mL (500 mLs Intravenous New Bag/Given 09/26/23 1509)  insulin  aspart (novoLOG ) injection 10 Units (10 Units Subcutaneous Given 09/26/23 1704)    Clinical Course as of 09/26/23 1734  Thu Sep 26, 2023  1522 Chest x-ray reviewed by me: Trachea is midline.  Cardiomegaly present.  Left heart borders obscured concerning for possible consolidation.  No obvious pneumothorax.   [AG]  1540 EKG reviewed by me: Normal sinus rhythm with left axis and normal intervals.  No evidence of acute ischemic change [AG]  1546 Beta-hydroxybutyrate within normal limits therefore less suggestive of diabetic ketoacidosis [AG]  1546 Sodium(!!): 119 Corrected sodium approximately 131 with POC glucose approximately 600 [AG]  1548 I-Stat venous blood gas, ([facility] ED, [facility], [facility])(!!) No evidence of acidosis [AG]  1707 Unassigned medicine consult placed [AG]    Clinical Course User Index [AG] Nada Chroman, DO                                 Medical Decision Making Amount and/or Complexity of Data Reviewed Labs: ordered. Decision-making details documented in ED Course.  Risk Prescription drug management. Decision  regarding hospitalization.   On  initial evaluation patient is hemodynamically stable, afebrile and not in acute distress.  Patient is alert and oriented to person and place which appears to be patient's baseline from previous chart review.  Patient is not acute distress and is resting comfortably in the room.  Based on patient's history and physical examination differential diagnosis includes DKA, HHS, hypoglycemia, infection, electrolyte abnormality, acute cystitis, thyroid  abnormality.  Will obtain laboratory studies including beta hydroxybutyrate acid and VBG in order to assess for DKA.  Will give 500 cc bolus in the setting of history of HFpEF.  Will obtain chest x-ray to assess for underlying infectious etiology of hyperglycemia.  Upon chart review patient was recently prescribed prednisone  which could have caused patient's presentation of hyperglycemia.  Laboratory studies without evidence of diabetic ketoacidosis or HHS.  Will give patient insulin  aspart 10 units for hyperglycemia.  Will admit patient to medicine for continued care and management for  the diagnosis of hyperglycemia secondary most likely to recent steroid use.     Final diagnoses:  Hyperglycemia    ED Discharge Orders     None       Lavanda Bolster DO Emergency Medicine PGY2    Bolster Lavanda, DO 09/26/23 1734    Dreama Longs, MD 09/27/23 [phone]

## 2023-09-26 NOTE — Hospital Course (Signed)
    BGT of 1106.  Sodium 131.

## 2023-09-26 NOTE — ED Notes (Signed)
 Pt. Back from CT.

## 2023-09-26 NOTE — H&P (Signed)
 History and Physical    Patient: [name] FMW:[id number] DOB: [birth year] DOA: 09/26/2023 DOS: the patient was seen and examined on 09/26/2023 . PCP: [facility]  Patient coming from: Home Chief complaint: Chief Complaint  Patient presents with   Hyperglycemia   HPI:  [name] is a 73 y.o. female with past medical history  of depression, abdominal wall hematoma, CKD IV baseline Cr 2.0, DM type II, essential hypertension hyperlipidemia, gout history of anemia, COPD,CAD s/p remote stent, BrCA s/p mastectomy and prior history of HTN, presenting with generalized weakness fatigue and shakiness.  Patient also reported nausea vomiting and abdominal pain. HPI is limited as pt is ill but is alert oriented and nonfocal.   ED Course:  Vital signs in the ED were notable for the following:  Vitals:   09/26/23 1443 09/26/23 1444 09/26/23 1500 09/26/23 1545  BP: 116/75  110/77 102/61  Pulse: 94  91 80  Temp: 98.8 F (37.1 C)     Resp: 18  14 13   Height:  4' 11 (1.499 m)    Weight:  64.7 kg    SpO2: 96%  97% 99%  TempSrc: Oral     BMI (Calculated):  28.79    >>ED evaluation thus far shows: Sodium 122 chloride 88 bicarb 18, initial blood glucose of 1106, BUN of 38, creatinine of 2.65 with a EGFR of 18, total bili 1.5, normal LFTs, alk phos 144, lipase 58. Troponin of 15. CBC is within normal limits. Beta hydroxybutyrate of 1.32.  >>While in the ED patient received the following: 500 cc normal saline, 10 units subcu insulin .   Review of Systems  Unable to perform ROS: Acuity of condition   Past Medical History:  Diagnosis Date   Ambulates with cane    straight   Angina    no current problems per patient at PAT appt 11/05/18, nitroglycerin  sl 06/30/20   Arthritis    HANDS   Asthma    Breast cancer (HCC)    Cancer (HCC) 09/04/2017   Right Breast   Carpal tunnel syndrome, bilateral 11/26/2016   Cholelithiasis    Cocaine abuse (HCC) 07/2012   per  [place] drug screen   COPD (chronic obstructive pulmonary disease) (HCC)    per patient   Coronary artery disease    Diabetes mellitus without mention of complication    type 2   Dysphagia    esophageal dysmotility on 03/2011 esophagram    Fatty liver disease, nonalcoholic 2009   on Imaging.    GERD (gastroesophageal reflux disease)    Headache    Hearing loss    bilateral - no hearing aids   History of colonic polyps 2009   adenomatous 2009, HP in 2009, 2011, 2013.    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   Wears dentures    full upper and partial lower   Wears glasses    Past Surgical History:  Procedure Laterality Date   ABDOMINAL ANGIOGRAM  02/20/2011   Procedure: ABDOMINAL ANGIOGRAM;  Surgeon: Rober LOISE Chroman, MD;  Location: [facility] CATH LAB;  Service: Cardiovascular;;   BREAST BIOPSY Left    CARPAL TUNNEL RELEASE Right 07/04/2017   Procedure: RIGHT CARPAL TUNNEL RELEASE;  Surgeon: Murrell Drivers, MD;  Location:  SUR[facility]ERY C[facility];  Service: Orthopedics;  Laterality: Right;   CARPAL TUNNEL RELEASE Left 09/12/2017   Procedure: LEFT CARPAL TUNNEL RELEASE;  Surgeon: Murrell Drivers, MD;  Location: [facility];  Service: Orthopedics;  Laterality: Left;   CATARACT EXTRACTION     CHOLECYSTECTOMY N/A 11/11/2018   Procedure: ATTEMPTED LAPAROSCOPIC CHOLECUSTECOMY,  OPEN CHOLECYSTECTOMY;  Surgeon: Vernetta Berg, MD;  Location: [facility] OR;  Service: General;  Laterality: N/A;   COLONOSCOPY  11/30/2011   Procedure: COLONOSCOPY;  Surgeon: Princella CHRISTELLA Nida, MD;  Location: [facility] ENDOS[facility];  Service: Endoscopy;  Laterality: N/A;   CORONARY ANGIOPLASTY WITH STENT PLACEMENT  02/20/2011   ERCP N/A 10/03/2018   Procedure: ENDOSCOPIC RETROGRADE CHOLANGIOPANCREATOGRAPHY (ERCP);  Surgeon: Aneita Gwendlyn DASEN, MD;  Location: [facility];  Service:  Endoscopy;  Laterality: N/A;   ESOPHAGOGASTRODUODENOSCOPY  11/30/2011   Procedure: ESOPHAGOGASTRODUODENOSCOPY (EGD);  Surgeon: Princella CHRISTELLA Nida, MD;  Location: [facility];  Service: Endoscopy;  Laterality: N/A;   ESOPHAGOGASTRODUODENOSCOPY N/A 02/23/2015   Procedure: ESOPHAGOGASTRODUODENOSCOPY (EGD);  Surgeon: Lupita FORBES Commander, MD;  Location: [facility] ENDOSCOPY;  Service: Endoscopy;  Laterality: N/A;   EYE SURGERY Bilateral    cataracts removed   INTRAOPERATIVE CHOLANGIOGRAM N/A 11/11/2018   Procedure: Intraoperative Cholangiogram;  Surgeon: Vernetta Berg, MD;  Location: [facility];  Service: General;  Laterality: N/A;   IR REMOVAL TUN ACCESS W/ PORT W/O FL MOD SED  06/30/2020   LEFT HEART CATHETERIZATION WITH CORONARY ANGIOGRAM N/A 02/20/2011   Procedure: LEFT HEART CATHETERIZATION WITH CORONARY ANGIOGRAM;  Surgeon: Rober LOISE Chroman, MD;  Location: [facility] CATH LAB;  Service: Cardiovascular;  Laterality: N/A;   MASTECTOMY Right 11/05/2017   MASTECTOMY W/ SENTINEL NODE BIOPSY Right 11/27/2017   MASTECTOMY W/ SENTINEL NODE BIOPSY Right 11/27/2017   Procedure: RIGHT TOTAL MASTECTOMY WITH SENTINEL LYMPH NODE BIOPSY;  Surgeon: Mikell Katz, MD;  Location: [facility] OR;  Service: General;  Laterality: Right;   PORT A CATH REVISION Left 05/07/2018   Procedure: PORT A CATH REVISION;  Surgeon: Mikell Katz, MD;  Location: [facility];  Service: General;  Laterality: Left;   PORTACATH PLACEMENT Left 11/27/2017   Procedure: INSERTION PORT-A-CATH;  Surgeon: Mikell Katz, MD;  Location: [facility];  Service: General;  Laterality: Left;   REVERSE SHOULDER ARTHROPLASTY Left 03/09/2021   Procedure: REVERSE SHOULDER ARTHROPLASTY;  Surgeon: Melita Drivers, MD;  Location: [facility];  Service: Orthopedics;  Laterality: Left;   RIGHT COLECTOMY  02/2007   for post polypectomy colonic perforation   SPHINCTEROTOMY  10/03/2018   Procedure: SPHINCTEROTOMY;  Surgeon: Aneita Gwendlyn DASEN, MD;  Location: [facility];  Service: Endoscopy;;    TUBAL LIGATION      reports that she quit smoking about 13 years ago. Her smoking use included cigarettes. She started smoking about 60 years ago. She has a 11.8 pack-year smoking history. She has never used smokeless tobacco. She reports that she does not currently use alcohol. She reports that she does not currently use drugs after having used the following drugs: Cocaine. No Known Allergies Family History  Problem Relation Age of Onset   Heart disease Mother    Diabetes Mother    Cancer Father        unsure what kind   Diabetes Sister    Colon cancer Neg Hx    Breast cancer Neg Hx    Prior to Admission medications   Medication Sig Start Date End Date Taking? Authorizing Provider  acetaminophen  (TYLENOL ) 650 MG CR tablet Take 650 mg by mouth every 8 (eight) hours as needed for pain.  [provider]  albuterol  (PROVENTIL ) (2.5 MG/3ML) 0.083% nebulizer solution Take 3 mLs (2.5 mg total) by nebulization every 6 (six) hours as needed for wheezing or shortness of breath. 08/02/21   Vivienne Delon HERO, PA-C  albuterol  (VENTOLIN  HFA) 108 (90 Base) MCG/ACT inhaler Inhale 1-2 puffs into the lungs every 6 (six) hours as needed for wheezing or shortness of breath. 09/14/21   Danton Jon HERO, PA-C  allopurinol  (ZYLOPRIM ) 100 MG tablet Take 100 mg by mouth daily.    [provider]  anastrozole  (ARIMIDEX ) 1 MG tablet TAKE ONE TABLET BY MOUTH ONCE DAILY 06/18/22   Odean Potts, MD  atorvastatin  (LIPITOR) 20 MG tablet Take 20 mg by mouth daily.    [provider]  Blood Pressure Monitor DEVI Please provide patient with insurance approved blood pressure monitor. I10.0 11/15/20   Fleming, Zelda W, NP  diclofenac  Sodium (VOLTAREN ) 1 % GEL Apply 1 Application topically 4 (four) times daily. Patient taking differently: Apply 1 Application topically in the morning and at bedtime. 09/13/21   Gudena, Vinay, MD  DULoxetine  (CYMBALTA ) 30 MG capsule Take 30 mg by mouth daily.     [provider]  feeding supplement, GLUCERNA SHAKE, (GLUCERNA SHAKE) LIQD Take 237 mLs by mouth 2 (two) times daily between meals. 06/02/23   Rosario Leatrice FERNS, MD  ferrous sulfate  325 (65 FE) MG tablet Take 1 tablet (325 mg total) by mouth daily with breakfast. 06/12/23   Danton Reyes DASEN, MD  fluticasone  (FLONASE ) 50 MCG/ACT nasal spray Place 2 sprays into both nostrils daily. 05/10/21   Newlin, Enobong, MD  gabapentin  (NEURONTIN ) 100 MG capsule Take 100-200 mg by mouth See admin instructions. Take one tablet by mouth in the morning and afternoon, then take 2 tablets every night per patient 03/12/22   [provider]  glipiZIDE  2.5 MG TABS Take 2.5 mg by mouth daily before breakfast. Patient not taking: Reported on 07/18/2023 06/02/23   Rosario Leatrice I, MD  hydrOXYzine  (ATARAX ) 25 MG tablet Take 50 mg by mouth at bedtime. 05/07/23   [provider]  Insulin  Pen Needle (EASY TOUCH PEN NEEDLES) 31G X 8 MM MISC Use to inject insulin  once daily and Victoza  once daily. Max of 2 pen needles a day. Must have office visit for refills 03/28/22   Newlin, Enobong, MD  methocarbamol  (ROBAXIN ) 500 MG tablet Take 1 tablet (500 mg total) by mouth 2 (two) times daily. 03/09/23   Prosperi, Christian H, PA-C  nitroGLYCERIN  (NITROSTAT ) 0.4 MG SL tablet Place 1 tablet (0.4 mg total) under the tongue every 5 (five) minutes as needed for chest pain. 03/15/20   Fleming, Zelda W, NP  oxyCODONE -acetaminophen  (PERCOCET) 5-325 MG tablet Take 1 tablet by mouth every 4 (four) hours as needed for severe pain (pain score 7-10). 07/18/23 07/17/24  Danford, Lonni SQUIBB, MD  pantoprazole  (PROTONIX ) 40 MG tablet TAKE ONE TABLET BY MOUTH EVERY DAY FOR ACID REFLUX 11/02/21   Fleming, Zelda W, NP  polyethylene glycol (MIRALAX  / GLYCOLAX ) 17 g packet Take 17 g by mouth daily. 06/03/23   Rosario Leatrice FERNS, MD  Respiratory Therapy Supplies (NEBULIZER MASK ADULT) MISC 1 Units by Does not apply route daily as needed. ICD  10 J44.9 05/08/21   Fleming, Zelda W, NP  senna (SENOKOT) 8.6 MG TABS tablet Take 1 tablet (8.6 mg total) by mouth daily as needed for mild constipation or moderate constipation. 06/02/23   Rosario Leatrice FERNS, MD  SYMBICORT  160-4.5 MCG/ACT inhaler Inhale 2 puffs into  the lungs in the morning and at bedtime. 02/28/22   [provider]                                                                                 Vitals:   09/26/23 1443 09/26/23 1444 09/26/23 1500 09/26/23 1545  BP: 116/75  110/77 102/61  Pulse: 94  91 80  Resp: 18  14 13   Temp: 98.8 F (37.1 C)     TempSrc: Oral     SpO2: 96%  97% 99%  Weight:  64.7 kg    Height:  4' 11 (1.499 m)     Physical Exam Vitals reviewed.  Constitutional:      General: She is awake. She is not in acute distress.    Appearance: She is ill-appearing.  HENT:     Head: Normocephalic.  Eyes:     Extraocular Movements: Extraocular movements intact.  Cardiovascular:     Rate and Rhythm: Normal rate and regular rhythm.     Heart sounds: Normal heart sounds.  Pulmonary:     Breath sounds: Normal breath sounds.  Abdominal:     General: There is distension.     Palpations: Abdomen is soft.     Tenderness: There is abdominal tenderness.     Comments: Generalized.   Neurological:     General: No focal deficit present.     Mental Status: She is alert and oriented to person, place, and time.  Psychiatric:        Behavior: Behavior is cooperative.     Labs on Admission: I have personally reviewed following labs and imaging studies CBC: Recent Labs  Lab 09/26/23 1449 09/26/23 1533  WBC 4.7  --   NEUTROABS 3.2  --   HGB 12.5 13.9  HCT 37.6 41.0  MCV 86.0  --   PLT 312  --    Basic Metabolic Panel: Recent Labs  Lab 09/26/23 1449 09/26/23 1533  NA 122* 119*  K 4.9 4.8  CL 88*  --   CO2 18*  --   GLUCOSE 1,106*  --   BUN 38*  --   CREATININE 2.65*  --   CALCIUM  9.2  --    GFR: Estimated Creatinine Clearance: 15 mL/min  (A) (by C-G formula based on SCr of 2.65 mg/dL (H)). Liver Function Tests: Recent Labs  Lab 09/26/23 1449  AST 14*  ALT 15  ALKPHOS 144*  BILITOT 1.5*  PROT 7.5  ALBUMIN  3.7   Recent Labs  Lab 09/26/23 1507  LIPASE 58*   No results for input(s): AMMONIA in the last 168 hours. Recent Labs    06/08/23 1837 06/09/23 0240 06/09/23 0241 06/09/23 0858 06/10/23 0225 06/11/23 0250 06/12/23 0234 07/17/23 1759 07/18/23 0438 09/26/23 1449  BUN 30* 28* 29*  28* 28* 31* 36* 30* 17 16 38*  CREATININE 2.06* 2.19* 2.26*  2.18* 2.26* 2.35* 2.37* 2.05* 2.11* 1.99* 2.65*    Estimated Creatinine Clearance: 15 mL/min (A) (by C-G formula based on SCr of 2.65 mg/dL (H)).   Recent Labs    06/08/23 1837 06/09/23 0240 06/09/23 0241 06/09/23 9141 06/10/23 0225 06/11/23 0250 06/12/23 0234 07/17/23 1759 07/18/23 9561 09/26/23 1449  BUN 30* 28* 29*  28* 28* 31* 36* 30* 17 16 38*  CREATININE 2.06* 2.19* 2.26*  2.18* 2.26* 2.35* 2.37* 2.05* 2.11* 1.99* 2.65*  CO2 21* 24 25  25 24 23 22 23  20* 22 18*   Cardiac Enzymes: No results for input(s): CKTOTAL, CKMB, CKMBINDEX, TROPONINI in the last 168 hours. BNP (last 3 results) No results for input(s): PROBNP in the last 8760 hours. HbA1C: No results for input(s): HGBA1C in the last 72 hours. CBG: Recent Labs  Lab 09/26/23 1446  GLUCAP >600*   Lipid Profile: No results for input(s): CHOL, HDL, LDLCALC, TRIG, CHOLHDL, LDLDIRECT in the last 72 hours. Thyroid  Function Tests: Recent Labs    09/26/23 1449 09/26/23 1506  TSH  --  0.464  FREET4 1.32*  --    Anemia Panel: No results for input(s): VITAMINB12, FOLATE, FERRITIN, TIBC, IRON , RETICCTPCT in the last 72 hours. Urine analysis:    Component Value Date/Time   COLORURINE STRAW (A) 09/26/2023 1607   APPEARANCEUR CLEAR 09/26/2023 1607   LABSPEC 1.020 09/26/2023 1607   PHURINE 6.0 09/26/2023 1607   GLUCOSEU >=500 (A) 09/26/2023 1607    HGBUR SMALL (A) 09/26/2023 1607   BILIRUBINUR NEGATIVE 09/26/2023 1607   KETONESUR NEGATIVE 09/26/2023 1607   PROTEINUR NEGATIVE 09/26/2023 1607   UROBILINOGEN 0.2 03/31/2014 2017   NITRITE NEGATIVE 09/26/2023 1607   LEUKOCYTESUR SMALL (A) 09/26/2023 1607   Radiological Exams on Admission: DG Chest Portable 1 View Result Date: 09/26/2023 CLINICAL DATA:  Productive cough. EXAM: PORTABLE CHEST 1 VIEW COMPARISON:  07/17/2023 FINDINGS: Poor inspiration. Stable borderline enlarged cardiac silhouette. Clear lungs with normal vascularity. Tortuous and partially calcified thoracic aorta. Left shoulder prosthesis. IMPRESSION: No acute abnormality. Electronically Signed   By: Elspeth Bathe M.D.   On: 09/26/2023 15:36   Data Reviewed: Relevant notes from primary care and specialist visits, past discharge summaries as available in EHR, including Care Everywhere . Prior diagnostic testing as pertinent to current admission diagnoses, Updated medications and problem lists for reconciliation .ED course, including vitals, labs, imaging, treatment and response to treatment,Triage notes, nursing and pharmacy notes and ED provider's notes.Notable results as noted in HPI.Discussed case with EDMD/ ED APP/ or Specialty MD on call and as needed.  Assessment & Plan  >> Nausea vomiting and abdominal pain: Supportive care IV PPI IV fluids sips of clear liquids as needed. Lipase elevated mildly 2/2 to her DKA. We will obtain ct abd and pelvis noncontrast.  NPO except for ice chips.   >> DKA/hyperglycemia/diabetes mellitus type 2: Patient meets DKA with this serum bicarb of 19 anion gap 16 blood glucose of 1106 on presentation, serum osmolality is pending.  Elevated beta hydroxy at 1.32. DKA protocol and progressive bed.   >> Severe hyponatremia: Patient presenting with severe hyponatremia of 119 Acute onset and corrected is 135 which is reassuring.  Cont with gentle IVF hydration per DKA protocol.    >>  CAD: No chest pain. EKG today sinus rhythm at 95 with PR intervals 149 left atrial enlargement, QTc is prolonged at 466 , EKG read states ST elevations in inferior leads similar to previous EKG . Prn NTG, continue atorvastatin , NPO otherwise.    >> CKD stage IV: Lab Results  Component Value Date   CREATININE 2.65 (H) 09/26/2023   CREATININE 1.99 (H) 07/18/2023   CREATININE 2.11 (H) 07/17/2023  Avoid contrast study.  Renally dose meds.  Currently allopurinol  is held.  >> COPD: As needed albuterol .  Continuous pulse oximetry.   >>  Essential hypertension: Vitals:   09/26/23 1443 09/26/23 1500 09/26/23 1545  BP: 116/75 110/77 102/61  No BP meds in chart.    DVT prophylaxis:  Heparin  Consults:  None  Advance Care Planning:    Code Status: Full Code   Family Communication:  None Disposition Plan:  Home Severity of Illness: The appropriate patient status for this patient is INPATIENT. Inpatient status is judged to be reasonable and necessary in order to provide the required intensity of service to ensure the patient's safety. The patient's presenting symptoms, physical exam findings, and initial radiographic and laboratory data in the context of their chronic comorbidities is felt to place them at high risk for further clinical deterioration. Furthermore, it is not anticipated that the patient will be medically stable for discharge from the hospital within 2 midnights of admission.   * I certify that at the point of admission it is my clinical judgment that the patient will require inpatient hospital care spanning beyond 2 midnights from the point of admission due to high intensity of service, high risk for further deterioration and high frequency of surveillance required.*  Unresulted Labs (From admission, onward)     Start     Ordered   09/27/23 0500  CBC with Differential/Platelet  Tomorrow morning,   R        09/26/23 1742   09/27/23 0500  Comprehensive metabolic panel with  GFR  Tomorrow morning,   R        09/26/23 1742   09/27/23 0500  Magnesium   Tomorrow morning,   R        09/26/23 1742   09/27/23 0500  Phosphorus  Tomorrow morning,   R        09/26/23 1742   09/26/23 1819  Hemoglobin A1c  Add-on,   R        09/26/23 1819   09/26/23 1756  Magnesium   Add-on,   AD        09/26/23 1755   09/26/23 1751  Osmolality  Once,   R        09/26/23 1750   09/26/23 1750  Procalcitonin  Once,   R       References:    Procalcitonin Lower Respiratory Tract Infection AND Sepsis Procalcitonin Algorithm   09/26/23 1750   09/26/23 1740  Basic metabolic panel  (Diabetes Ketoacidosis (DKA))  STAT Now then every 4 hours ,   R (with STAT occurrences)      09/26/23 1741   09/26/23 1740  Beta-hydroxybutyric acid  (Diabetes Ketoacidosis (DKA))  Now then every 4 hours,   R (with TIMED occurrences)      09/26/23 1741            Meds ordered this encounter  Medications   sodium chloride  0.9 % bolus 500 mL   insulin  aspart (novoLOG ) injection 10 Units   insulin  regular, human (MYXREDLIN ) 100 units/ 100 mL infusion    EndoTool Goal Range::   140-180    Type of Diabetes:   Type 2    Mode of Therapy:   ENDOX1 for DKA    Start Method:   EndoTool to calculate   lactated ringers  infusion   dextrose  5 % in lactated ringers  infusion   dextrose  50 % solution 0-50 mL   heparin  injection 5,000 Units   pantoprazole  (PROTONIX ) injection 40 mg   sodium chloride  0.9 % bolus 500 mL   atorvastatin  (LIPITOR) tablet 20 mg   anastrozole  (ARIMIDEX ) tablet 1 mg  albuterol  (PROVENTIL ) (2.5 MG/3ML) 0.083% nebulizer solution 2.5 mg   fluticasone  (FLONASE ) 50 MCG/ACT nasal spray 2 spray   DULoxetine  (CYMBALTA ) DR capsule 30 mg   nitroGLYCERIN  (NITROSTAT ) SL tablet 0.4 mg   oxyCODONE -acetaminophen  (PERCOCET/ROXICET) 5-325 MG per tablet 1 tablet    Refill:  0   polyethylene glycol (MIRALAX  / GLYCOLAX ) packet 17 g     Orders Placed This Encounter  Procedures   DG Chest Portable 1 View    CT ABDOMEN PELVIS WO CONTRAST   Comprehensive metabolic panel   CBC with Differential   Beta-hydroxybutyric acid   Urinalysis, Routine w reflex microscopic -Urine, Catheterized   TSH   T4, free   Rapid urine drug screen (hospital performed)   Lipase, blood   Basic metabolic panel   Beta-hydroxybutyric acid   CBC with Differential/Platelet   Comprehensive metabolic panel with GFR   Magnesium    Phosphorus   Procalcitonin   Osmolality   Magnesium    Hemoglobin A1c   Diet NPO time specified   ED Cardiac monitoring   Vital signs   Cardiac monitoring   Mobility Protocol: No Restrictions   Refer to Sidebar Report Mobility Protocol for Adult Inpatient   Apply Diabetes Mellitus Care Plan   Apply Diabetic Ketoacidosis Care Plan   Notify physician (specify)   If present, discontinue Insulin  Pump after IV Insulin  is initiated.   Do NOT use lab glucose values in EndoTool.  If CBG meter reads Critical High, enter 600.   Please refer to DKA Protocol sidebar report   Strict intake and output   Initiate Oral Care Protocol   Initiate Carrier Fluid Protocol   Notify physician (specify)   Upon IV fluid bolus completion, place order for STAT BMET (LAB15) and call provider with results.   IV insulin  infusion with sufficient glucose should be continued until MD determines acidosis is corrected and places transition orders.   RN may order General Admission PRN Orders utilizing General Admission PRN medications (through manage orders) for the following patient needs: allergy symptoms (Claritin), cold sores (Carmex), cough (Robitussin DM), eye irritation (Liquifilm Tears), hemorrhoids (Tucks), indigestion (Maalox), minor skin irritation (Hydrocortisone Cream), muscle pain Lucienne Gay), nose irritation (saline nasal spray) and sore throat (Chloraseptic spray).   IV bolus already initiated   K+ > 5 mEq/L and/or K+ addressed separately   Full code   Consult for Unassigned Medical Admission   Consult  to diabetes coordinator   Consult to Transition of Care Team   CBG monitoring, ED   I-Stat venous blood gas, ([facility] ED, [facility], [facility])   POC CBG, ED   I-Stat CG4 Lactic Acid   EKG 12-Lead   ED EKG   Saline lock IV   Admit to Inpatient (patient's expected length of stay will be greater than 2 midnights or inpatient only procedure)    Author: Mario LULLA Blanch, MD 12 pm- 8 pm. [facility]. 09/26/2023 6:19 PM Please note for any communication after hours contact [facility] Assigned provider on call on Amion.

## 2023-09-26 NOTE — Progress Notes (Addendum)
 Date and time results received: 09/26/23 @ 1900  Test: glucose  Critical Value: 919  Name of Provider Notified: DR PATEL  Orders Received? Or Actions Taken?:  NO NEW ORDERS RECIEVED

## 2023-09-27 ENCOUNTER — Other Ambulatory Visit (HOSPITAL_COMMUNITY): Payer: Self-pay

## 2023-09-27 ENCOUNTER — Telehealth (HOSPITAL_COMMUNITY): Payer: Self-pay | Admitting: Pharmacy Technician

## 2023-09-27 DIAGNOSIS — E1165 Type 2 diabetes mellitus with hyperglycemia: Secondary | ICD-10-CM | POA: Diagnosis not present

## 2023-09-27 DIAGNOSIS — R112 Nausea with vomiting, unspecified: Secondary | ICD-10-CM | POA: Diagnosis not present

## 2023-09-27 DIAGNOSIS — E1100 Type 2 diabetes mellitus with hyperosmolarity without nonketotic hyperglycemic-hyperosmolar coma (NKHHC): Secondary | ICD-10-CM

## 2023-09-27 DIAGNOSIS — Z794 Long term (current) use of insulin: Secondary | ICD-10-CM

## 2023-09-27 LAB — BASIC METABOLIC PANEL WITH GFR
Anion gap: 12 (ref 5–15)
BUN: 31 mg/dL — ABNORMAL HIGH (ref 8–23)
CO2: 20 mmol/L — ABNORMAL LOW (ref 22–32)
Calcium: 9.4 mg/dL (ref 8.9–10.3)
Chloride: 102 mmol/L (ref 98–111)
Creatinine, Ser: 2.16 mg/dL — ABNORMAL HIGH (ref 0.44–1.00)
GFR, Estimated: 23 mL/min — ABNORMAL LOW (ref >60)
Glucose, Bld: 281 mg/dL — ABNORMAL HIGH (ref 70–99)
Potassium: 3.7 mmol/L (ref 3.5–5.1)
Sodium: 134 mmol/L — ABNORMAL LOW (ref 135–145)

## 2023-09-27 LAB — COMPREHENSIVE METABOLIC PANEL WITH GFR
ALT: 12 U/L (ref 0–44)
AST: 14 U/L — ABNORMAL LOW (ref 15–41)
Albumin: 3.2 g/dL — ABNORMAL LOW (ref 3.5–5.0)
Alkaline Phosphatase: 104 U/L (ref 38–126)
Anion gap: 8 (ref 5–15)
BUN: 30 mg/dL — ABNORMAL HIGH (ref 8–23)
CO2: 20 mmol/L — ABNORMAL LOW (ref 22–32)
Calcium: 9.3 mg/dL (ref 8.9–10.3)
Chloride: 106 mmol/L (ref 98–111)
Creatinine, Ser: 2.09 mg/dL — ABNORMAL HIGH (ref 0.44–1.00)
GFR, Estimated: 24 mL/min — ABNORMAL LOW (ref >60)
Glucose, Bld: 170 mg/dL — ABNORMAL HIGH (ref 70–99)
Potassium: 3.3 mmol/L — ABNORMAL LOW (ref 3.5–5.1)
Sodium: 134 mmol/L — ABNORMAL LOW (ref 135–145)
Total Bilirubin: 1.1 mg/dL (ref 0.0–1.2)
Total Protein: 6.8 g/dL (ref 6.5–8.1)

## 2023-09-27 LAB — CBC WITH DIFFERENTIAL/PLATELET
Abs Immature Granulocytes: 0.04 K/uL (ref 0.00–0.07)
Basophils Absolute: 0 K/uL (ref 0.0–0.1)
Basophils Relative: 1 %
Eosinophils Absolute: 0.2 K/uL (ref 0.0–0.5)
Eosinophils Relative: 3 %
HCT: 35.2 % — ABNORMAL LOW (ref 36.0–46.0)
Hemoglobin: 12.2 g/dL (ref 12.0–15.0)
Immature Granulocytes: 1 %
Lymphocytes Relative: 33 %
Lymphs Abs: 1.8 K/uL (ref 0.7–4.0)
MCH: 28.7 pg (ref 26.0–34.0)
MCHC: 34.7 g/dL (ref 30.0–36.0)
MCV: 82.8 fL (ref 80.0–100.0)
Monocytes Absolute: 0.6 K/uL (ref 0.1–1.0)
Monocytes Relative: 11 %
Neutro Abs: 2.7 K/uL (ref 1.7–7.7)
Neutrophils Relative %: 51 %
Platelets: 280 K/uL (ref 150–400)
RBC: 4.25 MIL/uL (ref 3.87–5.11)
RDW: 12.1 % (ref 11.5–15.5)
WBC: 5.3 K/uL (ref 4.0–10.5)
nRBC: 0 % (ref 0.0–0.2)

## 2023-09-27 LAB — BETA-HYDROXYBUTYRIC ACID
Beta-Hydroxybutyric Acid: 0.13 mmol/L (ref 0.05–0.27)
Beta-Hydroxybutyric Acid: 0.14 mmol/L (ref 0.05–0.27)

## 2023-09-27 LAB — GLUCOSE, CAPILLARY
Glucose-Capillary: 202 mg/dL — ABNORMAL HIGH (ref 70–99)
Glucose-Capillary: 204 mg/dL — ABNORMAL HIGH (ref 70–99)
Glucose-Capillary: 270 mg/dL — ABNORMAL HIGH (ref 70–99)
Glucose-Capillary: 313 mg/dL — ABNORMAL HIGH (ref 70–99)
Glucose-Capillary: 333 mg/dL — ABNORMAL HIGH (ref 70–99)
Glucose-Capillary: 340 mg/dL — ABNORMAL HIGH (ref 70–99)
Glucose-Capillary: 419 mg/dL — ABNORMAL HIGH (ref 70–99)

## 2023-09-27 LAB — MAGNESIUM: Magnesium: 1.8 mg/dL (ref 1.7–2.4)

## 2023-09-27 LAB — PHOSPHORUS: Phosphorus: 2.7 mg/dL (ref 2.5–4.6)

## 2023-09-27 MED ORDER — INSULIN ASPART 100 UNIT/ML IJ SOLN
0.0000 [IU] | Freq: Every day | INTRAMUSCULAR | Status: DC
  Administered 2023-09-27: 4 [IU] via SUBCUTANEOUS

## 2023-09-27 MED ORDER — INSULIN ASPART 100 UNIT/ML IJ SOLN: 0.0000 [IU] | Freq: Three times a day (TID) | INTRAMUSCULAR | Status: DC

## 2023-09-27 MED ORDER — SIMETHICONE 80 MG PO CHEW
80.0000 mg | CHEWABLE_TABLET | Freq: Four times a day (QID) | ORAL | Status: DC | PRN
  Administered 2023-09-27: 80 mg via ORAL
  Filled 2023-09-27: qty 1

## 2023-09-27 MED ORDER — LANCETS MISC. MISC
1.0000 | Freq: Three times a day (TID) | 0 refills | Status: DC
Start: 2023-09-27 — End: 2023-10-02

## 2023-09-27 MED ORDER — INSULIN GLARGINE 100 UNIT/ML ~~LOC~~ SOLN
10.0000 [IU] | Freq: Two times a day (BID) | SUBCUTANEOUS | Status: DC
  Administered 2023-09-27: 10 [IU] via SUBCUTANEOUS
  Filled 2023-09-27 (×3): qty 0.1

## 2023-09-27 MED ORDER — INSULIN GLARGINE 100 UNIT/ML SOLOSTAR PEN
20.0000 [IU] | PEN_INJECTOR | Freq: Every day | SUBCUTANEOUS | 0 refills | Status: DC
Start: 2023-09-27 — End: 2023-09-28

## 2023-09-27 MED ORDER — EASY TOUCH PEN NEEDLES 31G X 8 MM MISC: 0 refills | Status: DC

## 2023-09-27 MED ORDER — BLOOD GLUCOSE MONITORING SUPPL DEVI: 1.0000 | Freq: Three times a day (TID) | 0 refills | Status: DC

## 2023-09-27 MED ORDER — INSULIN ASPART 100 UNIT/ML IJ SOLN: 5.0000 [IU] | Freq: Once | INTRAMUSCULAR | Status: DC

## 2023-09-27 MED ORDER — INSULIN ASPART 100 UNIT/ML IJ SOLN
10.0000 [IU] | Freq: Once | INTRAMUSCULAR | Status: AC
  Administered 2023-09-27: 10 [IU] via SUBCUTANEOUS

## 2023-09-27 MED ORDER — LANCET DEVICE MISC: 1.0000 | Freq: Three times a day (TID) | 0 refills | Status: DC

## 2023-09-27 MED ORDER — INSULIN GLARGINE-YFGN 100 UNIT/ML ~~LOC~~ SOLN: 10.0000 [IU] | Freq: Two times a day (BID) | SUBCUTANEOUS | Status: DC

## 2023-09-27 MED ORDER — INSULIN ASPART 100 UNIT/ML IJ SOLN
0.0000 [IU] | Freq: Three times a day (TID) | INTRAMUSCULAR | Status: DC
  Administered 2023-09-27: 6 [IU] via SUBCUTANEOUS
  Administered 2023-09-27: 4 [IU] via SUBCUTANEOUS
  Administered 2023-09-27: 2 [IU] via SUBCUTANEOUS
  Administered 2023-09-27 – 2023-09-28 (×2): 4 [IU] via SUBCUTANEOUS
  Administered 2023-09-28: 6 [IU] via SUBCUTANEOUS
  Administered 2023-09-28: 2 [IU] via SUBCUTANEOUS

## 2023-09-27 MED ORDER — GABAPENTIN 100 MG PO CAPS
100.0000 mg | ORAL_CAPSULE | Freq: Two times a day (BID) | ORAL | Status: DC
  Administered 2023-09-28 (×2): 100 mg via ORAL
  Filled 2023-09-27 (×2): qty 1

## 2023-09-27 MED ORDER — BLOOD GLUCOSE TEST VI STRP
1.0000 | ORAL_STRIP | Freq: Three times a day (TID) | 0 refills | Status: DC
Start: 2023-09-27 — End: 2023-10-02

## 2023-09-27 MED ORDER — GABAPENTIN 100 MG PO CAPS: 100.0000 mg | ORAL_CAPSULE | ORAL | Status: DC

## 2023-09-27 MED ORDER — GABAPENTIN 100 MG PO CAPS
200.0000 mg | ORAL_CAPSULE | Freq: Every day | ORAL | Status: DC
  Administered 2023-09-27: 200 mg via ORAL
  Filled 2023-09-27: qty 2

## 2023-09-27 NOTE — Evaluation (Signed)
 Physical Therapy Evaluation Patient Details Name: [name] MRN: [id number] DOB: Jul 02, 1950 Today's Date: 09/27/2023  History of Present Illness  73 y.o. female presents to [facility] hospital on 09/26/2023 with weakness fatigue, nausea and vomiting.Concern for DKA given blood glucose of 1106. PMH includes depression, CKD IV, DMII, HTN, HLD, gout, anemia, COPD, CAD, BrCA s/p mastectomy.  Clinical Impression  Pt presents to PT with deficits in activity tolerance, strength, power, endurance, gait, balance. Pt reports she is a household ambulator at baseline, typically not leaving the home much or ambulating in the community. Pt is able to transfer and ambulate without physical assistance for short household distances, showing improved stability with use of rollator as opposed to small based quad cane. PT encourages use of the rollator for all out of bed mobility once returning home. Pt reports she will have assistance from family as needed at the time of discharge. PT recommends discharge home with HHPT when medically appropriate.        If plan is discharge home, recommend the following: A little help with walking and/or transfers;A little help with bathing/dressing/bathroom;Assistance with cooking/housework;Direct supervision/assist for medications management;Direct supervision/assist for financial management;Assist for transportation;Help with stairs or ramp for entrance   Can travel by private vehicle        Equipment Recommendations None recommended by PT  Recommendations for Other Services       Functional Status Assessment Patient has had a recent decline in their functional status and demonstrates the ability to make significant improvements in function in a reasonable and predictable amount of time.     Precautions / Restrictions Precautions Precautions: Fall Recall of Precautions/Restrictions: Intact Restrictions Weight Bearing Restrictions Per Provider Order: No      Mobility   Bed Mobility                    Transfers Overall transfer level: Needs assistance Equipment used: Straight cane, Rollator (4 wheels) Transfers: Sit to/from Stand Sit to Stand: Contact guard assist                Ambulation/Gait Ambulation/Gait assistance: Min assist, Contact guard assist Gait Distance (Feet): 45 Feet (additional trial of 15' with quad cane) Assistive device: Rollator (4 wheels), Quad cane Gait Pattern/deviations: Step-through pattern Gait velocity: reduced Gait velocity interpretation: <1.31 ft/sec, indicative of household ambulator   General Gait Details: slowed step-through gait, mild increase in stride length and reduced lateral sway with use of rollator. Pt consistently reaching for UE support with LUE when ambulating with quad cane in RUE  Stairs            Wheelchair Mobility     Tilt Bed    Modified Rankin (Stroke Patients Only)       Balance Overall balance assessment: Needs assistance Sitting-balance support: No upper extremity supported, Feet supported Sitting balance-Leahy Scale: Good     Standing balance support: Bilateral upper extremity supported, Reliant on assistive device for balance Standing balance-Leahy Scale: Poor                               Pertinent Vitals/Pain Pain Assessment Pain Assessment: No/denies pain    Home Living Family/patient expects to be discharged to:: Private residence Living Arrangements: Other relatives (grandson, also son in and out) Available Help at Discharge: Family;Available 24 hours/day (pt reports between her son, grandson and grandson's significant other that someone can be there all the time to assist)  Type of Home: House Home Access: Stairs to enter Entrance Stairs-Rails: Doctor, general practice of Steps: 3   Home Layout: One level Home Equipment: Rollator (4 wheels);Toilet riser;Hospital bed;Cane - quad      Prior Function Prior Level of Function  : Needs assist             Mobility Comments: ambulating with quad cane indoors and rollator when leaving the home, does not leave the house much ADLs Comments: family provides transportation, uses roller carts in grocery store, son or grandson help with shower     Extremity/Trunk Assessment   Upper Extremity Assessment Upper Extremity Assessment: Generalized weakness    Lower Extremity Assessment Lower Extremity Assessment: Generalized weakness    Cervical / Trunk Assessment Cervical / Trunk Assessment: Kyphotic  Communication   Communication Communication: Impaired Factors Affecting Communication: Hearing impaired    Cognition Arousal: Alert Behavior During Therapy: WFL for tasks assessed/performed   PT - Cognitive impairments: No family/caregiver present to determine baseline, Memory                       PT - Cognition Comments: pt may have some short term memory deficits, unable to report why managing her insulin  was difficult, reports she was taking the correct doses Following commands: Intact       Cueing Cueing Techniques: Verbal cues     General Comments General comments (skin integrity, edema, etc.): VSS on RA    Exercises     Assessment/Plan    PT Assessment Patient needs continued PT services  PT Problem List Decreased strength;Decreased activity tolerance;Decreased balance;Decreased mobility;Decreased knowledge of use of DME       PT Treatment Interventions DME instruction;Gait training;Stair training;Functional mobility training;Therapeutic activities;Therapeutic exercise;Balance training;Neuromuscular re-education;Patient/family education    PT Goals (Current goals can be found in the Care Plan section)  Acute Rehab PT Goals Patient Stated Goal: to return home PT Goal Formulation: With patient Time For Goal Achievement: 10/11/23 Potential to Achieve Goals: Fair    Frequency Min 2X/week     Co-evaluation                AM-PAC PT 6 Clicks Mobility  Outcome Measure Help needed turning from your back to your side while in a flat bed without using bedrails?: A Little Help needed moving from lying on your back to sitting on the side of a flat bed without using bedrails?: A Little Help needed moving to and from a bed to a chair (including a wheelchair)?: A Little Help needed standing up from a chair using your arms (e.g., wheelchair or bedside chair)?: A Little Help needed to walk in hospital room?: A Little Help needed climbing 3-5 steps with a railing? : A Little 6 Click Score: 18    End of Session Equipment Utilized During Treatment: Gait belt Activity Tolerance: Patient limited by fatigue Patient left: in chair;with call bell/phone within reach Nurse Communication: Mobility status PT Visit Diagnosis: Other abnormalities of gait and mobility (R26.89);Muscle weakness (generalized) (M62.81)    Time: 1525-1550 PT Time Calculation (min) (ACUTE ONLY): 25 min   Charges:   PT Evaluation $PT Eval Low Complexity: 1 Low   PT General Charges $$ ACUTE PT VISIT: 1 Visit         Bernardino JINNY Ruth, PT, DPT Acute Rehabilitation Office [phone]   Bernardino JINNY Ruth 09/27/2023, 4:03 PM

## 2023-09-27 NOTE — Telephone Encounter (Signed)
 Patient Product/process development scientist completed.    The patient is insured through [facility]. Patient has Medicare and is not eligible for a copay card, but may be able to apply for patient assistance or Medicare RX Payment Plan (Patient Must reach out to their plan, if eligible for payment plan), if available.    Ran test claim for Lantus Pen and the current 30 day co-pay is $0.00.  Ran test claim for Humalog KwikPen and the current 30 day co-pay is $0.00.  This test claim was processed through [facility]- copay amounts may vary at other pharmacies due to pharmacy/plan contracts, or as the patient moves through the different stages of their insurance plan.     [name], CPHT Pharmacy Technician III Certified Patient Advocate [facility] Patient Advocate Team Direct Number: [phone]  Fax: [phone]

## 2023-09-27 NOTE — Progress Notes (Signed)
   09/27/23 1656  TOC Brief Assessment  Insurance and Status Reviewed  Patient has primary care physician Yes  Home environment has been reviewed home (son plans to stay with pt on discharge)  Prior level of function: assisted  Prior/Current Home Services No current home services (has hx with [facility]- they are not longer in contract w/ insurance)  Social Drivers of Health Review SDOH reviewed no interventions necessary  Readmission risk has been reviewed Yes  Transition of care needs transition of care needs identified, TOC will continue to follow     PT/OT evals pending, per chart review pt has DME at home to include rollator, hospital bed and home 02 w/ [employer]. Pt has used [facility] in past- spoke with liaison who voiced they are no longer in contract with pt's insurance.  PT/OT evals pending. Per benefits check for insulin  - pt has zero dollar copay.   IP CM to follow up for therapy recommendations.

## 2023-09-27 NOTE — Evaluation (Signed)
 Occupational Therapy Evaluation Patient Details Name: [name] MRN: [id number] DOB: [birth year] Today's Date: 09/27/2023   History of Present Illness   73 y.o. female presents to [facility] hospital on 09/26/2023 with weakness fatigue, nausea and vomiting.Concern for DKA given blood glucose of 1106. PMH includes depression, CKD IV, DMII, HTN, HLD, gout, anemia, COPD, CAD, BrCA s/p mastectomy.     Clinical Impressions Pt admitted for above, PTA pt was living with her grandson while her son would come by on a regular basis, at baseline she was getting assistance with getting in/out the shower but grossly ind with her ADL ambulating with QC in home. Performed medication assessment and pt scored below norms indicating that she would benefit from assist with med management. Pt was overall completing ADLs with CGA/setup A, she was fatigued very easy following a prolonged stand, although did have PT session just prior. With family intervention, assisting her with meds pt would be appropriate for home therapy services once able. OT to continue following pt while in acute setting to progress pt as able. Patient would benefit from post acute Home OT services to help maximize functional independence in natural environment with listed DME and support for med management.      If plan is discharge home, recommend the following:   A little help with bathing/dressing/bathroom;Assistance with cooking/housework     Functional Status Assessment   Patient has had a recent decline in their functional status and demonstrates the ability to make significant improvements in function in a reasonable and predictable amount of time.     Equipment Recommendations   Tub/shower seat     Recommendations for Other Services         Precautions/Restrictions   Precautions Precautions: Fall Recall of Precautions/Restrictions: Intact Restrictions Weight Bearing Restrictions Per Provider Order: No      Mobility Bed Mobility               General bed mobility comments: pt sitting in recliner on arrival    Transfers Overall transfer level: Needs assistance Equipment used: 1 person hand held assist Transfers: Sit to/from Stand Sit to Stand: Contact guard assist                  Balance Overall balance assessment: Needs assistance Sitting-balance support: No upper extremity supported, Feet supported Sitting balance-Leahy Scale: Good     Standing balance support: Reliant on assistive device for balance, Single extremity supported Standing balance-Leahy Scale: Poor                             ADL either performed or assessed with clinical judgement   ADL Overall ADL's : Needs assistance/impaired Eating/Feeding: Independent;Sitting   Grooming: Standing;Contact guard assist   Upper Body Bathing: Sitting;Set up Upper Body Bathing Details (indicate cue type and reason): does not have shower seat at home Lower Body Bathing: Set up;Sitting/lateral leans   Upper Body Dressing : Sitting;Set up   Lower Body Dressing: Contact guard assist;Sit to/from stand                       Vision Baseline Vision/History: 0 No visual deficits Patient Visual Report: No change from baseline Additional Comments: Pt reports blurry vision has gone away now that she has her glasses.     Perception Perception: Not tested       Praxis Praxis: [facility]       Pertinent Vitals/Pain Pain  Assessment Pain Assessment: No/denies pain     Extremity/Trunk Assessment Upper Extremity Assessment Upper Extremity Assessment: Generalized weakness   Lower Extremity Assessment Lower Extremity Assessment: Generalized weakness   Cervical / Trunk Assessment Cervical / Trunk Assessment: Kyphotic   Communication Communication Communication: Impaired Factors Affecting Communication: Hearing impaired   Cognition Arousal: Alert Behavior During Therapy: WFL for tasks  assessed/performed Cognition: Cognition impaired     Awareness: Intellectual awareness intact, Online awareness impaired Memory impairment (select all impairments): Short-term memory, Working memory Attention impairment (select first level of impairment): Selective attention, Sustained attention   OT - Cognition Comments: administered medicog assessment, on STM word recall pt got 1/3 correct, she accurately completed the clock draw without fault and without any reminders on time setting. During medcation transfer log pt struggled with question interpretation and idenifying when it was appropriate to take meds scoring 2/5 on that section. Pt total score was 5/10 indicative of need for assist with med management.                 Following commands: Intact       Cueing  General Comments   Cueing Techniques: Verbal cues  VSS on RA   Exercises     Shoulder Instructions      Home Living Family/patient expects to be discharged to:: Private residence Living Arrangements: Other relatives (grandson, also son in and out) Available Help at Discharge: Family;Available 24 hours/day (pt reports between her son, grandson and grandson's significant other that someone can be there all the time to assist) Type of Home: House Home Access: Stairs to enter [employer] of Steps: 3 Entrance Stairs-Rails: Right;Left Home Layout: One level     Bathroom Shower/Tub: Producer, television/film/video: Standard Bathroom Accessibility: Yes   Home Equipment: Rollator (4 wheels);Toilet riser;Hospital bed;Cane - quad          Prior Functioning/Environment Prior Level of Function : Needs assist             Mobility Comments: ambulating with quad cane indoors and rollator when leaving the home, does not leave the house much ADLs Comments: family provides transportation, uses roller carts in grocery store, son or grandson help with shower    OT Problem List: Impaired balance  (sitting and/or standing);Decreased activity tolerance;Decreased strength;Decreased cognition   OT Treatment/Interventions: Self-care/ADL training;Patient/family education;Therapeutic exercise;Balance training;Therapeutic activities;DME and/or AE instruction;Cognitive remediation/compensation      OT Goals(Current goals can be found in the care plan section)   Acute Rehab OT Goals Patient Stated Goal: to get better to go home OT Goal Formulation: With patient Time For Goal Achievement: 10/11/23 Potential to Achieve Goals: Good ADL Goals Pt Will Perform Grooming: with modified independence;standing Pt Will Perform Lower Body Bathing: with modified independence;sit to/from stand Pt Will Perform Lower Body Dressing: with modified independence;sitting/lateral leans;sit to/from stand Pt Will Transfer to Toilet: with modified independence;ambulating Additional ADL Goal #1: Pt family will demonstrate ability to manage pt medications for her without cueing.   OT Frequency:  Min 2X/week    Co-evaluation              AM-PAC OT 6 Clicks Daily Activity     Outcome Measure Help from another person eating meals?: None Help from another person taking care of personal grooming?: A Little Help from another person toileting, which includes using toliet, bedpan, or urinal?: A Little Help from another person bathing (including washing, rinsing, drying)?: A Little Help from another person to put on and  taking off regular upper body clothing?: A Little Help from another person to put on and taking off regular lower body clothing?: A Little 6 Click Score: 19   End of Session Equipment Utilized During Treatment: Gait belt Nurse Communication: Mobility status  Activity Tolerance: Patient limited by fatigue Patient left: in chair;with call bell/phone within reach  OT Visit Diagnosis: Unsteadiness on feet (R26.81);Muscle weakness (generalized) (M62.81);Other symptoms and signs involving  cognitive function                Time: 8387-8361 OT Time Calculation (min): 26 min Charges:  OT General Charges $OT Visit: 1 Visit OT Evaluation $OT Eval Moderate Complexity: 1 Mod OT Treatments $Cognitive Funtion inital: Initial 15 mins  09/27/2023  AB, OTR/L  Acute Rehabilitation Services  Office: [phone]   [name] 09/27/2023, 5:48 PM

## 2023-09-27 NOTE — Care Management Obs Status (Signed)
 MEDICARE OBSERVATION STATUS NOTIFICATION   Patient Details  Name: [name] MRN: [id number] Date of Birth: 11/21/1950   Medicare Observation Status Notification Given:  Yes    Charlann Rayfield Hurst, RN 09/27/2023, 4:51 PM

## 2023-09-27 NOTE — Progress Notes (Signed)
 PROGRESS NOTE    [name]  FMW:[id number] DOB: [birth year] DOA: 09/26/2023 PCP: [facility]   Brief Narrative:  [name] is a 73 y.o. female with past medical history  of depression, abdominal wall hematoma, CKD IV baseline Cr 2.0, DM type II, essential hypertension hyperlipidemia, gout history of anemia, COPD,CAD s/p remote stent, BrCA s/p mastectomy and prior history of HTN, presenting with generalized weakness fatigue and shakiness.  Patient also reported nausea vomiting and abdominal pain.  In ED patient found to have profoundly elevated glucose greater than 1000.  Patient initially had mild anion gap which resolved quite rapidly on IV insulin .  Patient's glucose continues to be somewhat difficult to control with moderate episodes of hypoglycemia today despite increased doses of insulin .  A1c 8.2 in April confirms uncontrolled hyperglycemia.  Will continue close monitoring frequent glucose monitor checks and increasing insulin  until patient is more adequately controlled.    Assessment & Plan:   Principal Problem:   Nausea and vomiting   HHNKS, unlikely DKA Mild anion gap - resolved Uncontrolled DM 2 - with hyperglycemia Glucose >1k at intake - downtrending appropriately Increase long-acting insulin  now 10 unit twice daily, continue sliding scale insulin  Initially off insulin  drip overnight, if patient continues to be profoundly uncontrolled he may need to reinitiate insulin  drip in the next few hours -A1c 8.2 in April, repeat pending  Intractable N/V 2/2 above -Resolved with improvement in hyperglycemia, tolerating p.o. well without difficulty    Blurry vision  - Appears to be chronic, noted as far back as 2013 on chart review - Likely related to above profound hyperglycemia, will follow clinically as glucose improves expect vision to improve as well  Pseudohyponatremia Resolved with treatment of above - approaching baseline/normal limits   CAD  history Without signs/symptoms of ACS EKD with minimal ST changes (appear chronic) - follow clincally   AKI on CKD stage IV: Baseline creatinin 2.0-2.2 Admitted at 2.65 - downtrending appropriately    COPD, without acute exacerbation As needed albuterol .  Continuous pulse oximetry.   Essential hypertension: No medications listed on home list - currently well-controlled  DVT prophylaxis: heparin  injection 5,000 Units Start: 09/26/23 1745 Code Status:   Code Status: Full Code Family Communication: None present  Status is: Observation  Dispo: The patient is from: Home              Anticipated d/c is to: Home              Anticipated d/c date is: 24 to 48 hours              Patient currently not medically stable for discharge  Consultants:  None  Procedures:  None  Antimicrobials:  None indicated  Subjective: No acute issues or events overnight, tolerating p.o. well this morning with remarkably improved symptoms of nausea and vomiting.  Otherwise feels back to baseline.  Throughout the day patient had episodes of blurred vision which wax and wane but are not ' severe' per the patient  Objective: Vitals:   09/27/23 1056 09/27/23 1156 09/27/23 1230 09/27/23 1603  BP:  (!) 105/93 98/76 99/86   Pulse: 75 90 84 94  Resp:  18 18 19   Temp:  97.9 F (36.6 C)  98 F (36.7 C)  TempSrc:  Oral  Oral  SpO2: 100% 100% 100% 100%  Weight:      Height:        Intake/Output Summary (Last 24 hours) at 09/27/2023 1905 Last  data filed at 09/27/2023 1609 Gross per 24 hour  Intake 1734.1 ml  Output --  Net 1734.1 ml   Filed Weights   09/26/23 1444  Weight: 64.7 kg    Examination:  General exam: Appears calm and comfortable  Respiratory system: Clear to auscultation. Respiratory effort normal. Cardiovascular system: S1 & S2 heard, RRR. No JVD, murmurs, rubs, gallops or clicks. No pedal edema. Gastrointestinal system: Abdomen is nondistended, soft and nontender. No organomegaly  or masses felt. Normal bowel sounds heard. Central nervous system: Alert and oriented. No focal neurological deficits. Extremities: Symmetric 5 x 5 power. Skin: No rashes, lesions or ulcers Psychiatry: Judgement and insight appear normal. Mood & affect appropriate.     Data Reviewed: I have personally reviewed following labs and imaging studies  CBC: Recent Labs  Lab 09/26/23 1449 09/26/23 1533 09/27/23 0534  WBC 4.7  --  5.3  NEUTROABS 3.2  --  2.7  HGB 12.5 13.9 12.2  HCT 37.6 41.0 35.2*  MCV 86.0  --  82.8  PLT 312  --  280   Basic Metabolic Panel: Recent Labs  Lab 09/26/23 1449 09/26/23 1533 09/26/23 1733 09/26/23 2132 09/27/23 0305 09/27/23 0534  NA 122* 119* 122* 132* 134* 134*  K 4.9 4.8 4.4 3.5 3.7 3.3*  CL 88*  --  89* 99 102 106  CO2 18*  --  20* 20* 20* 20*  GLUCOSE 1,106*  --  919* 369* 281* 170*  BUN 38*  --  37* 34* 31* 30*  CREATININE 2.65*  --  2.55* 2.42* 2.16* 2.09*  CALCIUM  9.2  --  9.3 9.8 9.4 9.3  MG  --   --  2.0  --   --  1.8  PHOS  --   --   --   --   --  2.7   GFR: Estimated Creatinine Clearance: 19 mL/min (A) (by C-G formula based on SCr of 2.09 mg/dL (H)). Liver Function Tests: Recent Labs  Lab 09/26/23 1449 09/27/23 0534  AST 14* 14*  ALT 15 12  ALKPHOS 144* 104  BILITOT 1.5* 1.1  PROT 7.5 6.8  ALBUMIN  3.7 3.2*   Recent Labs  Lab 09/26/23 1507  LIPASE 58*   No results for input(s): AMMONIA in the last 168 hours. Coagulation Profile: No results for input(s): INR, PROTIME in the last 168 hours. Cardiac Enzymes: No results for input(s): CKTOTAL, CKMB, CKMBINDEX, TROPONINI in the last 168 hours. BNP (last 3 results) No results for input(s): PROBNP in the last 8760 hours. HbA1C: No results for input(s): HGBA1C in the last 72 hours. CBG: Recent Labs  Lab 09/27/23 0137 09/27/23 0804 09/27/23 1159 09/27/23 1404 09/27/23 1607  GLUCAP 313* 204* 340* 270* 419*   Lipid Profile: No results for input(s):  CHOL, HDL, LDLCALC, TRIG, CHOLHDL, LDLDIRECT in the last 72 hours. Thyroid  Function Tests: Recent Labs    09/26/23 1449 09/26/23 1506  TSH  --  0.464  FREET4 1.32*  --    Anemia Panel: No results for input(s): VITAMINB12, FOLATE, FERRITIN, TIBC, IRON , RETICCTPCT in the last 72 hours. Sepsis Labs: Recent Labs  Lab 09/26/23 1733  PROCALCITON <0.10    No results found for this or any previous visit (from the past 240 hours).       Radiology Studies: CT ABDOMEN PELVIS WO CONTRAST Result Date: 09/26/2023 CLINICAL DATA:  Abdominal pain, nausea vomiting. EXAM: CT ABDOMEN AND PELVIS WITHOUT CONTRAST TECHNIQUE: Multidetector CT imaging of the abdomen and pelvis was performed following  the standard protocol without IV contrast. RADIATION DOSE REDUCTION: This exam was performed according to the departmental dose-optimization program which includes automated exposure control, adjustment of the mA and/or kV according to patient size and/or use of iterative reconstruction technique. COMPARISON:  CT abdomen pelvis dated 09/26/2023. FINDINGS: Evaluation of this exam is limited in the absence of intravenous contrast as well as due to respiratory motion. Lower chest: Bibasilar subpleural atelectasis. No intra-abdominal free air or free fluid. Hepatobiliary: The liver is unremarkable. No biliary dilatation. Cholecystectomy. No retained calcified stone noted in the central CBD. Pancreas: Unremarkable. No pancreatic ductal dilatation or surrounding inflammatory changes. Spleen: Normal in size without focal abnormality. Adrenals/Urinary Tract: The adrenal glands unremarkable. There is no hydronephrosis or nephrolithiasis on either side. Indeterminate hypodense lesions in the left kidney not evaluated on this CT, possibly cysts. This can be better evaluated with ultrasound on a nonemergent/outpatient basis. The visualized ureters and urinary bladder appear unremarkable. Stomach/Bowel:  There is postsurgical changes of bowel with anastomotic staple line in the right lower quadrant. There is no bowel obstruction or active inflammation. Appendectomy. Vascular/Lymphatic: Moderate aortoiliac atherosclerotic disease. The IVC is unremarkable. No portal venous gas. There is no adenopathy. Reproductive: Small calcified fibroid. No suspicious adnexal masses. Other: Midline vertical anterior pelvic wall incisional scar. Musculoskeletal: Degenerative changes of the spine. No acute osseous pathology. IMPRESSION: 1. No acute intra-abdominal or pelvic pathology. 2. Postsurgical changes of bowel. No bowel obstruction. 3.  Aortic Atherosclerosis (ICD10-I70.0). Electronically Signed   By: Vanetta Chou M.D.   On: 09/26/2023 18:39   DG Chest Portable 1 View Result Date: 09/26/2023 CLINICAL DATA:  Productive cough. EXAM: PORTABLE CHEST 1 VIEW COMPARISON:  07/17/2023 FINDINGS: Poor inspiration. Stable borderline enlarged cardiac silhouette. Clear lungs with normal vascularity. Tortuous and partially calcified thoracic aorta. Left shoulder prosthesis. IMPRESSION: No acute abnormality. Electronically Signed   By: Elspeth Bathe M.D.   On: 09/26/2023 15:36   Scheduled Meds:  anastrozole   1 mg Oral Daily   atorvastatin   20 mg Oral Daily   DULoxetine   30 mg Oral Daily   fluticasone   2 spray Each Nare Daily   heparin   5,000 Units Subcutaneous Q8H   insulin  aspart  0-6 Units Subcutaneous TID WC   insulin  aspart  2 Units Subcutaneous TID WC   insulin  glargine-yfgn  10 Units Subcutaneous BID   pantoprazole  (PROTONIX ) IV  40 mg Intravenous Q12H   polyethylene glycol  17 g Oral Daily   Continuous Infusions:  sodium chloride  40 mL/hr at 09/27/23 9692   lactated ringers  150 mL/hr at 09/26/23 2219     LOS: 1 day   Time spent:  Elsie JAYSON [place], DO [facility]  If 7PM-7AM, please contact night-coverage [email or web address]  09/27/2023, 7:05 PM

## 2023-09-27 NOTE — Inpatient Diabetes Management (Signed)
 Inpatient Diabetes Program Recommendations  AACE/ADA: New Consensus Statement on Inpatient Glycemic Control (2015)  Target Ranges:  Prepandial:   less than 140 mg/dL      Peak postprandial:   less than 180 mg/dL (1-2 hours)      Critically ill patients:  140 - 180 mg/dL   Lab Results  Component Value Date   GLUCAP 340 (H) 09/27/2023   HGBA1C 8.2 (H) 05/24/2023    Latest Reference Range & Units 09/27/23 00:34 09/27/23 01:37 09/27/23 08:04 09/27/23 11:59  Glucose-Capillary 70 - 99 mg/dL 797 (H) 686 (H) 795 (H) 340 (H)  (H): Data is abnormally high Review of Glycemic Control  Diabetes history: DM2 Outpatient Diabetes medications: none Current orders for Inpatient glycemic control: Semglee  7 units at HS, Novolog  0-6 units correction scale TID, Novolog  2 units TID  Inpatient Diabetes Program Recommendations:   Noted that blood sugars have been greater than 200 mg/dl.  Recommend increasing Semglee  to 12 units at HS, increase Novolog  to 0-9 units correction scale TID, increase Novolog  to 4 units TID with meals if patient eating at least 50% of meal.  Titrate dosages as needed.   Discharge Recommendations: Supply/Referral recommendations: Glucometer Test strips Lancet device Lancets   Use Adult Diabetes Insulin  Treatment Post Discharge order set.

## 2023-09-27 NOTE — Progress Notes (Signed)
 Patient reports new onset blurred vision. MD made aware.   Glucose 340 - RN administered schedule meal time insulin  and SSI, totaling 6 units insulin  (novoLOG ). MD aware and approved.   Recheck glucose 30 minutes after administration of 6 units of insulin  (novoLOG ).

## 2023-09-27 NOTE — Plan of Care (Signed)
  Problem: Metabolic: Goal: Ability to maintain appropriate glucose levels will improve Outcome: Progressing   Problem: Nutritional: Goal: Maintenance of adequate nutrition will improve Outcome: Progressing   Problem: Clinical Measurements: Goal: Diagnostic test results will improve Outcome: Progressing   Problem: Activity: Goal: Risk for activity intolerance will decrease Outcome: Progressing

## 2023-09-27 NOTE — Care Management CC44 (Signed)
 Condition Code 44 Documentation Completed  Patient Details  Name: [name] MRN: [id number] Date of Birth: September 23, 1950   Condition Code 44 given:  Yes Patient signature on Condition Code 44 notice:  Yes Documentation of 2 MD's agreement:  Yes Code 44 added to claim:  Yes    Charlann Rayfield Hurst, RN 09/27/2023, 4:51 PM

## 2023-09-27 NOTE — Inpatient Diabetes Management (Addendum)
 Inpatient Diabetes Program Recommendations  AACE/ADA: New Consensus Statement on Inpatient Glycemic Control (2015)  Target Ranges:  Prepandial:   less than 140 mg/dL      Peak postprandial:   less than 180 mg/dL (1-2 hours)      Critically ill patients:  140 - 180 mg/dL   Lab Results  Component Value Date   GLUCAP 340 (H) 09/27/2023   HGBA1C 8.2 (H) 05/24/2023    Review of Glycemic Control  Diabetes history: DM2 Outpatient Diabetes medications: none Current orders for Inpatient glycemic control: Semglee  7 units at HS, Novolog  0-6 units correction scale TID, Novolog  2 units TID  Inpatient Diabetes Program Recommendations:   Spoke with patient at the bedside. Patient is hard of hearing and states that she does not have her glasses with her at the hospital. States that her PCP took her off the oral medication. Discussed her admission blood sugar of 1,106 mg/dl. She was surprised that it was that high. Compared this blood sugar to normal blood sugar of 80-130 mg/dl. Discussed her using insulin  pens at home. Patient does not eat 3 meals per day; she states that she only eats twice a day. She relies on Meals on Wheels during the week and her son helps with some food, as well.   Reviewed the insulin  pen instructions: patient was able to put on needle, reminded to dial  to 2 units prior to dialing her insulin  dosage that would be prescribed. Discussed sites for injections. Patient was able to return the steps, with a little bit of help.   Patient lives alone. Son and grandchildren live nearby. Coordinator not confident that patient can remember to take her insulin , much less remember how to dial  dosage and give the insulin  everyday, same time everyday.   Recommend that patient may need help to be reminded to take the insulin . Recommend one injection per day, since patient does not eat consistently. This patient will need help from family, HH, etc. Patient will need a new blood glucose meter,  strips, and lancets. Will have staff RN to have patient practice giving an injection while in the hospital.  ADDENDUM: spoke with son on the phone. He has helped mother give insulin  before; he states that he can help her, but would like to have HH to come in some during the week. He agrees that his mother needs help and is very weak and hard for her to get around the house on her own.  Marjorie Lunger RN BSN CDE Diabetes Coordinator Pager: [phone]  8am-5pm

## 2023-09-28 ENCOUNTER — Other Ambulatory Visit (HOSPITAL_COMMUNITY): Payer: Self-pay

## 2023-09-28 DIAGNOSIS — E1100 Type 2 diabetes mellitus with hyperosmolarity without nonketotic hyperglycemic-hyperosmolar coma (NKHHC): Secondary | ICD-10-CM | POA: Diagnosis not present

## 2023-09-28 DIAGNOSIS — E1165 Type 2 diabetes mellitus with hyperglycemia: Secondary | ICD-10-CM | POA: Diagnosis not present

## 2023-09-28 LAB — GLUCOSE, CAPILLARY
Glucose-Capillary: 225 mg/dL — ABNORMAL HIGH (ref 70–99)
Glucose-Capillary: 407 mg/dL — ABNORMAL HIGH (ref 70–99)
Glucose-Capillary: 419 mg/dL — ABNORMAL HIGH (ref 70–99)
Glucose-Capillary: 428 mg/dL — ABNORMAL HIGH (ref 70–99)
Glucose-Capillary: 430 mg/dL — ABNORMAL HIGH (ref 70–99)
Glucose-Capillary: 301 mg/dL — ABNORMAL HIGH (ref 70–99)

## 2023-09-28 LAB — BASIC METABOLIC PANEL WITH GFR
Anion gap: 5 (ref 5–15)
BUN: 26 mg/dL — ABNORMAL HIGH (ref 8–23)
CO2: 22 mmol/L (ref 22–32)
Calcium: 9.2 mg/dL (ref 8.9–10.3)
Chloride: 107 mmol/L (ref 98–111)
Creatinine, Ser: 1.93 mg/dL — ABNORMAL HIGH (ref 0.44–1.00)
GFR, Estimated: 27 mL/min — ABNORMAL LOW (ref >60)
Glucose, Bld: 145 mg/dL — ABNORMAL HIGH (ref 70–99)
Potassium: 3.2 mmol/L — ABNORMAL LOW (ref 3.5–5.1)
Sodium: 134 mmol/L — ABNORMAL LOW (ref 135–145)

## 2023-09-28 MED ORDER — INSULIN ASPART 100 UNIT/ML IJ SOLN
10.0000 [IU] | INTRAMUSCULAR | Status: AC
  Administered 2023-09-28: 10 [IU] via SUBCUTANEOUS

## 2023-09-28 MED ORDER — INSUPEN PEN NEEDLES 32G X 4 MM MISC
0 refills | Status: DC
  Filled 2023-09-28: qty 100, 50d supply, fill #0

## 2023-09-28 MED ORDER — INSULIN GLARGINE 100 UNIT/ML ~~LOC~~ SOLN
15.0000 [IU] | Freq: Two times a day (BID) | SUBCUTANEOUS | Status: DC
  Administered 2023-09-28: 15 [IU] via SUBCUTANEOUS
  Filled 2023-09-28 (×2): qty 0.15

## 2023-09-28 MED ORDER — ACETAMINOPHEN 325 MG PO TABS
650.0000 mg | ORAL_TABLET | Freq: Four times a day (QID) | ORAL | Status: DC | PRN
  Administered 2023-09-28: 650 mg via ORAL
  Filled 2023-09-28: qty 2

## 2023-09-28 MED ORDER — POTASSIUM CHLORIDE CRYS ER 20 MEQ PO TBCR: 20.0000 meq | EXTENDED_RELEASE_TABLET | Freq: Every day | ORAL | 0 refills | Status: DC

## 2023-09-28 MED ORDER — INSULIN GLARGINE 100 UNIT/ML SOLOSTAR PEN
15.0000 [IU] | PEN_INJECTOR | Freq: Two times a day (BID) | SUBCUTANEOUS | 0 refills | Status: DC
  Filled 2023-09-28: qty 9, 30d supply, fill #0

## 2023-09-28 NOTE — Discharge Summary (Addendum)
 Physician Discharge Summary  [name] FMW:[id number] DOB: May 28, 1950 DOA: 09/26/2023  PCP: [facility]  Admit date: 09/26/2023 Discharge date: 09/28/2023  Admitted From: Home Disposition: Home  Recommendations for Outpatient Follow-up:  Follow up with PCP in 1-2 weeks Repeat labs (BMP) in 1-2 weeks to evaluate renal function/potassium Follow-up with cardiology:  Home Health: PT Equipment/Devices: None  Discharge Condition: Stable CODE STATUS: Full Diet recommendation: Low-salt low-fat diet  Brief/Interim Summary: [name] is a 73 y.o. female with past medical history  of depression, abdominal wall hematoma, CKD IV baseline Cr 2.0, DM type II, essential hypertension hyperlipidemia, gout history of anemia, COPD,CAD s/p remote stent, BrCA s/p mastectomy and prior history of HTN, presenting with generalized weakness fatigue and shakiness.  Patient also reported nausea vomiting and abdominal pain.  In ED patient found to have profoundly elevated glucose greater than 1000.  Patient initially had mild anion gap which resolved quite rapidly on IV insulin .  Patient's glucose continues to be somewhat difficult to control with moderate episodes of hypoglycemia today despite increased doses of insulin .  A1c 8.2 in April confirms uncontrolled hyperglycemia.    Patient admitted as above with profoundly uncontrolled hyperglycemia complicated by noncompliance.  Adjustments made as below with moderate improvement in hyperglycemia and symptoms. Patient continues to eat/drink well outside of recommended diet - glucose well controlled this am- at lunch she apparently ate part of a cake and all the icing per staff. Attempting to simplify insulin  regimen to BID long acting as sliding scale/premeal appear to be difficult to remember/calculate. Potassium somewhat low on new regimen - will supplement with 20meq over the next week and recommend repeat testing with primary team for labs to  ensure recovery. She is otherwise stable and agreeable for discharge home.   Discharge Diagnoses:  Principal Problem:   Nausea and vomiting   HHNKS, unlikely DKA Mild anion gap - resolved Uncontrolled DM 2 - with hyperglycemia Glucose >1k at intake - downtrending appropriately Increase long-acting insulin  now 15 unit twice daily -A1c 8.2 in April, repeat ordered and pending - follow results with primary team.  Intractable N/V 2/2 above -Resolved with improvement in hyperglycemia, tolerating p.o. well without difficulty    Blurry vision, resolved - Appears to be chronic/waxing and waning in nature, noted as far back as 2013 on chart review - Likely related to above profound hyperglycemia, will follow clinically as glucose improves expect vision to improve as well - recommend outpatient eye exam in a few weeks after glucose has been more appropriately controlled.   Pseudohyponatremia Resolved with treatment of above - approaching baseline/normal limits   CAD history Without signs/symptoms of ACS EKD with minimal ST changes (appear chronic) - follow clincally   AKI on CKD stage IV: Baseline creatinin 2.0-2.2 Admitted at 2.65 - downtrending appropriately    COPD, without acute exacerbation As needed albuterol .  Continuous pulse oximetry.   Essential hypertension: No medications listed on home list - currently well-controlled  Discharge Instructions  Discharge Instructions     Call MD for:  difficulty breathing, headache or visual disturbances   Complete by: As directed    Call MD for:  extreme fatigue   Complete by: As directed    Call MD for:  hives   Complete by: As directed    Call MD for:  persistant dizziness or light-headedness   Complete by: As directed    Call MD for:  persistant nausea and vomiting   Complete by: As directed  Call MD for:  severe uncontrolled pain   Complete by: As directed    Call MD for:  temperature >100.4   Complete by: As directed     Diet - low sodium heart healthy   Complete by: As directed    Discharge instructions   Complete by: As directed    Follow-up with PCP in the next 1 to 2 weeks to review diabetes medications insulin  and glucose levels.   Increase activity slowly   Complete by: As directed    Increase activity slowly   Complete by: As directed       Allergies as of 09/28/2023   No Known Allergies      Medication List     STOP taking these medications    glipiZIDE  2.5 MG Tabs       TAKE these medications    acetaminophen  650 MG CR tablet Commonly known as: TYLENOL  Take 650 mg by mouth every 8 (eight) hours as needed for pain.   albuterol  (2.5 MG/3ML) 0.083% nebulizer solution Commonly known as: PROVENTIL  Take 3 mLs (2.5 mg total) by nebulization every 6 (six) hours as needed for wheezing or shortness of breath.   albuterol  108 (90 Base) MCG/ACT inhaler Commonly known as: VENTOLIN  HFA Inhale 1-2 puffs into the lungs every 6 (six) hours as needed for wheezing or shortness of breath.   allopurinol  100 MG tablet Commonly known as: ZYLOPRIM  Take 100 mg by mouth daily.   anastrozole  1 MG tablet Commonly known as: ARIMIDEX  TAKE ONE TABLET BY MOUTH ONCE DAILY   atorvastatin  20 MG tablet Commonly known as: LIPITOR Take 20 mg by mouth daily.   Blood Glucose Monitoring Suppl Devi 1 each by Does not apply route in the morning, at noon, and at bedtime. May substitute to any manufacturer covered by patient's insurance.   BLOOD GLUCOSE TEST STRIPS Strp 1 each by In Vitro route in the morning, at noon, and at bedtime. May substitute to any manufacturer covered by patient's insurance.   Blood Pressure Monitor Devi Please provide patient with insurance approved blood pressure monitor. I10.0   diclofenac  Sodium 1 % Gel Commonly known as: Voltaren  Apply 1 Application topically 4 (four) times daily. What changed: when to take this   DULoxetine  30 MG capsule Commonly known as:  CYMBALTA  Take 30 mg by mouth daily.   Easy Touch Pen Needles 31G X 8 MM Misc Generic drug: Insulin  Pen Needle Use to inject insulin  once daily and Victoza  once daily. Max of 2 pen needles a day. Must have office visit for refills   feeding supplement (GLUCERNA SHAKE) Liqd Take 237 mLs by mouth 2 (two) times daily between meals.   fluticasone  50 MCG/ACT nasal spray Commonly known as: FLONASE  Place 2 sprays into both nostrils daily.   gabapentin  100 MG capsule Commonly known as: NEURONTIN  Take 100-200 mg by mouth See admin instructions. Take one tablet by mouth in the morning and afternoon, then take 2 tablets every night per patient   hydrOXYzine  25 MG tablet Commonly known as: ATARAX  Take 50 mg by mouth at bedtime.   insulin  glargine 100 UNIT/ML injection Commonly known as: LANTUS  Inject 0.15 mLs (15 Units total) into the skin 2 (two) times daily.   Lancet Device Misc 1 each by Does not apply route in the morning, at noon, and at bedtime. May substitute to any manufacturer covered by patient's insurance.   Lancets Misc. Misc 1 each by Does not apply route in the morning, at noon, and  at bedtime. May substitute to any manufacturer covered by patient's insurance.   methocarbamol  500 MG tablet Commonly known as: ROBAXIN  Take 1 tablet (500 mg total) by mouth 2 (two) times daily. What changed:  when to take this reasons to take this   Nebulizer Mask Adult Misc 1 Units by Does not apply route daily as needed. ICD 10 J44.9   nitroGLYCERIN  0.4 MG SL tablet Commonly known as: NITROSTAT  Place 1 tablet (0.4 mg total) under the tongue every 5 (five) minutes as needed for chest pain.   oxyCODONE -acetaminophen  5-325 MG tablet Commonly known as: Percocet Take 1 tablet by mouth every 4 (four) hours as needed for severe pain (pain score 7-10).   pantoprazole  40 MG tablet Commonly known as: PROTONIX  TAKE ONE TABLET BY MOUTH EVERY DAY FOR ACID REFLUX   polyethylene glycol 17 g  packet Commonly known as: MIRALAX  / GLYCOLAX  Take 17 g by mouth daily.   senna 8.6 MG Tabs tablet Commonly known as: SENOKOT Take 1 tablet (8.6 mg total) by mouth daily as needed for mild constipation or moderate constipation.        No Known Allergies  Consultations: None   Procedures/Studies: CT ABDOMEN PELVIS WO CONTRAST Result Date: 09/26/2023 CLINICAL DATA:  Abdominal pain, nausea vomiting. EXAM: CT ABDOMEN AND PELVIS WITHOUT CONTRAST TECHNIQUE: Multidetector CT imaging of the abdomen and pelvis was performed following the standard protocol without IV contrast. RADIATION DOSE REDUCTION: This exam was performed according to the departmental dose-optimization program which includes automated exposure control, adjustment of the mA and/or kV according to patient size and/or use of iterative reconstruction technique. COMPARISON:  CT abdomen pelvis dated 09/26/2023. FINDINGS: Evaluation of this exam is limited in the absence of intravenous contrast as well as due to respiratory motion. Lower chest: Bibasilar subpleural atelectasis. No intra-abdominal free air or free fluid. Hepatobiliary: The liver is unremarkable. No biliary dilatation. Cholecystectomy. No retained calcified stone noted in the central CBD. Pancreas: Unremarkable. No pancreatic ductal dilatation or surrounding inflammatory changes. Spleen: Normal in size without focal abnormality. Adrenals/Urinary Tract: The adrenal glands unremarkable. There is no hydronephrosis or nephrolithiasis on either side. Indeterminate hypodense lesions in the left kidney not evaluated on this CT, possibly cysts. This can be better evaluated with ultrasound on a nonemergent/outpatient basis. The visualized ureters and urinary bladder appear unremarkable. Stomach/Bowel: There is postsurgical changes of bowel with anastomotic staple line in the right lower quadrant. There is no bowel obstruction or active inflammation. Appendectomy. Vascular/Lymphatic:  Moderate aortoiliac atherosclerotic disease. The IVC is unremarkable. No portal venous gas. There is no adenopathy. Reproductive: Small calcified fibroid. No suspicious adnexal masses. Other: Midline vertical anterior pelvic wall incisional scar. Musculoskeletal: Degenerative changes of the spine. No acute osseous pathology. IMPRESSION: 1. No acute intra-abdominal or pelvic pathology. 2. Postsurgical changes of bowel. No bowel obstruction. 3.  Aortic Atherosclerosis (ICD10-I70.0). Electronically Signed   By: Vanetta Chou M.D.   On: 09/26/2023 18:39   DG Chest Portable 1 View Result Date: 09/26/2023 CLINICAL DATA:  Productive cough. EXAM: PORTABLE CHEST 1 VIEW COMPARISON:  07/17/2023 FINDINGS: Poor inspiration. Stable borderline enlarged cardiac silhouette. Clear lungs with normal vascularity. Tortuous and partially calcified thoracic aorta. Left shoulder prosthesis. IMPRESSION: No acute abnormality. Electronically Signed   By: Elspeth Bathe M.D.   On: 09/26/2023 15:36     Subjective: No acute issues or events overnight   Discharge Exam: Vitals:   09/28/23 0427 09/28/23 0843  BP: 100/74 95/85  Pulse: 80 85  Resp: 16  Temp: 98 F (36.7 C) 97.9 F (36.6 C)  SpO2: 100%    Vitals:   09/27/23 2041 09/27/23 2353 09/28/23 0427 09/28/23 0843  BP: (!) 95/58 95/69 100/74 95/85  Pulse:  79 80 85  Resp: 16 12 16    Temp: 97.9 F (36.6 C) 98 F (36.7 C) 98 F (36.7 C) 97.9 F (36.6 C)  TempSrc: Oral Oral Oral Oral  SpO2: 98% 100% 100%   Weight:      Height:        General: Pt is alert, awake, not in acute distress Cardiovascular: RRR, S1/S2 +, no rubs, no gallops Respiratory: CTA bilaterally, no wheezing, no rhonchi Abdominal: Soft, NT, ND, bowel sounds + Extremities: no edema, no cyanosis    The results of significant diagnostics from this hospitalization (including imaging, microbiology, ancillary and laboratory) are listed below for reference.     Microbiology: No results  found for this or any previous visit (from the past 240 hours).   Labs: BNP (last 3 results) Recent Labs    11/02/22 2206 05/23/23 1728 07/17/23 2041  BNP 124.8* 21.5 14.5   Basic Metabolic Panel: Recent Labs  Lab 09/26/23 1733 09/26/23 2132 09/27/23 0305 09/27/23 0534 09/28/23 0511  NA 122* 132* 134* 134* 134*  K 4.4 3.5 3.7 3.3* 3.2*  CL 89* 99 102 106 107  CO2 20* 20* 20* 20* 22  GLUCOSE 919* 369* 281* 170* 145*  BUN 37* 34* 31* 30* 26*  CREATININE 2.55* 2.42* 2.16* 2.09* 1.93*  CALCIUM  9.3 9.8 9.4 9.3 9.2  MG 2.0  --   --  1.8  --   PHOS  --   --   --  2.7  --    Liver Function Tests: Recent Labs  Lab 09/26/23 1449 09/27/23 0534  AST 14* 14*  ALT 15 12  ALKPHOS 144* 104  BILITOT 1.5* 1.1  PROT 7.5 6.8  ALBUMIN  3.7 3.2*   Recent Labs  Lab 09/26/23 1507  LIPASE 58*   No results for input(s): AMMONIA in the last 168 hours. CBC: Recent Labs  Lab 09/26/23 1449 09/26/23 1533 09/27/23 0534  WBC 4.7  --  5.3  NEUTROABS 3.2  --  2.7  HGB 12.5 13.9 12.2  HCT 37.6 41.0 35.2*  MCV 86.0  --  82.8  PLT 312  --  280   Cardiac Enzymes: No results for input(s): CKTOTAL, CKMB, CKMBINDEX, TROPONINI in the last 168 hours. BNP: Invalid input(s): POCBNP CBG: Recent Labs  Lab 09/27/23 1159 09/27/23 1404 09/27/23 1607 09/27/23 2053 09/28/23 0804  GLUCAP 340* 270* 419* 333* 225*   D-Dimer No results for input(s): DDIMER in the last 72 hours. Hgb A1c No results for input(s): HGBA1C in the last 72 hours. Lipid Profile No results for input(s): CHOL, HDL, LDLCALC, TRIG, CHOLHDL, LDLDIRECT in the last 72 hours. Thyroid  function studies Recent Labs    09/26/23 1506  TSH 0.464   Anemia work up No results for input(s): VITAMINB12, FOLATE, FERRITIN, TIBC, IRON , RETICCTPCT in the last 72 hours. Urinalysis    Component Value Date/Time   COLORURINE STRAW (A) 09/26/2023 1607   APPEARANCEUR CLEAR 09/26/2023 1607    LABSPEC 1.020 09/26/2023 1607   PHURINE 6.0 09/26/2023 1607   GLUCOSEU >=500 (A) 09/26/2023 1607   HGBUR SMALL (A) 09/26/2023 1607   BILIRUBINUR NEGATIVE 09/26/2023 1607   KETONESUR NEGATIVE 09/26/2023 1607   PROTEINUR NEGATIVE 09/26/2023 1607   UROBILINOGEN 0.2 03/31/2014 2017   NITRITE NEGATIVE 09/26/2023 1607  LEUKOCYTESUR SMALL (A) 09/26/2023 1607   Sepsis Labs Recent Labs  Lab 09/26/23 1449 09/27/23 0534  WBC 4.7 5.3   Microbiology No results found for this or any previous visit (from the past 240 hours).   Time coordinating discharge: Over 30 minutes  SIGNED:   Elsie JAYSON [place], DO [facility] 09/28/2023, 12:29 PM Pager   If 7PM-7AM, please contact night-coverage [email or web address]

## 2023-09-28 NOTE — Progress Notes (Signed)
 RNCM received HHPT order from Dr. Lue.  RNCM spoke with patient and offered choice for [facility] services.  Patient with no preference for [facility] services so Joane at [place] contacted with order and confirmation received.  Patient in agreement to services.

## 2023-09-28 NOTE — Inpatient Diabetes Management (Addendum)
 Inpatient Diabetes Program Recommendations  AACE/ADA: New Consensus Statement on Inpatient Glycemic Control (2015)  Target Ranges:  Prepandial:   less than 140 mg/dL      Peak postprandial:   less than 180 mg/dL (1-2 hours)      Critically ill patients:  140 - 180 mg/dL    Latest Reference Range & Units 09/27/23 08:04 09/27/23 11:59 09/27/23 14:04 09/27/23 16:07 09/27/23 20:53 09/28/23 08:04  Glucose-Capillary 70 - 99 mg/dL 795 (H) 659 (H) 729 (H) 419 (H) 333 (H) 225 (H)   Review of Glycemic Control  Diabetes history: DM2 Outpatient Diabetes medications: None; per patient PCP had stopped DM meds; Per PCP note on 09/26/23 Glipizide  2.5 mg QAM listed as home medication Current orders for Inpatient glycemic control: Lantus  10 units BID, Novolog  0-6 units TID with meals, Novolog  0-5 units at bedtime, Novolog  2 units TID with meals  Inpatient Diabetes Program Recommendations:    Insulin : Noted patient received Lantus  7 units on 09/26/23 and Lantus  10 units on 09/27/23. Lantus  increased to 10 units BID and CBG 225 mg/dl at 1:95 am today.    Outpatient DM: Per diabetes coordinator note on 8/22, patient would benefit from simplified insulin  regimen with one injection per day; patient's son will help with insulin  if needed.  Discharge Recommendations: Long acting recommendations: Insulin  Glargine (LANTUS ) Solostar Pen Dose to be determined by MD  Hypoglycemia treatment recommendations: Baqsimi  3mg  Supply/Referral recommendations: Glucometer Test strips Lancet device Lancets Pen needles - standard  Use Adult Diabetes Insulin  Treatment Post Discharge order set.  Thanks, Earnie Gainer, RN, MSN, CDCES Diabetes Coordinator Inpatient Diabetes Program [phone] (Team Pager from 8am to 5pm)

## 2023-09-28 NOTE — Plan of Care (Signed)

## 2023-09-30 LAB — HEMOGLOBIN A1C
Hgb A1c MFr Bld: >15.5 % — ABNORMAL HIGH (ref 4.8–5.6)
Hgb A1c MFr Bld: >15.5 % — ABNORMAL HIGH (ref 4.8–5.6)
Mean Plasma Glucose: >398 mg/dL
Mean Plasma Glucose: >398 mg/dL

## 2023-10-02 ENCOUNTER — Inpatient Hospital Stay (HOSPITAL_COMMUNITY)
Admission: EM | Admit: 2023-10-02 | Discharge: 2023-10-04 | DRG: 638 | Disposition: A | Discharge status: Home / Self Care | Attending: Internal Medicine | Admitting: Internal Medicine

## 2023-10-02 ENCOUNTER — Emergency Department (HOSPITAL_COMMUNITY)

## 2023-10-02 ENCOUNTER — Other Ambulatory Visit: Payer: Self-pay

## 2023-10-02 DIAGNOSIS — I2510 Atherosclerotic heart disease of native coronary artery without angina pectoris: Secondary | ICD-10-CM | POA: Diagnosis present

## 2023-10-02 DIAGNOSIS — K219 Gastro-esophageal reflux disease without esophagitis: Secondary | ICD-10-CM | POA: Diagnosis present

## 2023-10-02 DIAGNOSIS — R739 Hyperglycemia, unspecified: Principal | ICD-10-CM | POA: Diagnosis present

## 2023-10-02 DIAGNOSIS — E1100 Type 2 diabetes mellitus with hyperosmolarity without nonketotic hyperglycemic-hyperosmolar coma (NKHHC): Principal | ICD-10-CM | POA: Diagnosis present

## 2023-10-02 DIAGNOSIS — E119 Type 2 diabetes mellitus without complications: Secondary | ICD-10-CM

## 2023-10-02 DIAGNOSIS — N179 Acute kidney failure, unspecified: Secondary | ICD-10-CM | POA: Diagnosis present

## 2023-10-02 DIAGNOSIS — I129 Hypertensive chronic kidney disease with stage 1 through stage 4 chronic kidney disease, or unspecified chronic kidney disease: Secondary | ICD-10-CM | POA: Diagnosis present

## 2023-10-02 DIAGNOSIS — Z833 Family history of diabetes mellitus: Secondary | ICD-10-CM

## 2023-10-02 DIAGNOSIS — I252 Old myocardial infarction: Secondary | ICD-10-CM

## 2023-10-02 DIAGNOSIS — G3184 Mild cognitive impairment, so stated: Secondary | ICD-10-CM | POA: Diagnosis present

## 2023-10-02 DIAGNOSIS — N184 Chronic kidney disease, stage 4 (severe): Secondary | ICD-10-CM | POA: Diagnosis present

## 2023-10-02 DIAGNOSIS — Z853 Personal history of malignant neoplasm of breast: Secondary | ICD-10-CM

## 2023-10-02 DIAGNOSIS — E1165 Type 2 diabetes mellitus with hyperglycemia: Secondary | ICD-10-CM | POA: Diagnosis present

## 2023-10-02 DIAGNOSIS — E1140 Type 2 diabetes mellitus with diabetic neuropathy, unspecified: Secondary | ICD-10-CM | POA: Diagnosis present

## 2023-10-02 DIAGNOSIS — Z96612 Presence of left artificial shoulder joint: Secondary | ICD-10-CM | POA: Diagnosis present

## 2023-10-02 DIAGNOSIS — K760 Fatty (change of) liver, not elsewhere classified: Secondary | ICD-10-CM | POA: Diagnosis present

## 2023-10-02 DIAGNOSIS — H9193 Unspecified hearing loss, bilateral: Secondary | ICD-10-CM | POA: Diagnosis present

## 2023-10-02 DIAGNOSIS — Z955 Presence of coronary angioplasty implant and graft: Secondary | ICD-10-CM

## 2023-10-02 DIAGNOSIS — E785 Hyperlipidemia, unspecified: Secondary | ICD-10-CM | POA: Diagnosis present

## 2023-10-02 DIAGNOSIS — Z8249 Family history of ischemic heart disease and other diseases of the circulatory system: Secondary | ICD-10-CM

## 2023-10-02 DIAGNOSIS — Z794 Long term (current) use of insulin: Secondary | ICD-10-CM

## 2023-10-02 DIAGNOSIS — E1122 Type 2 diabetes mellitus with diabetic chronic kidney disease: Secondary | ICD-10-CM | POA: Diagnosis present

## 2023-10-02 DIAGNOSIS — Z79811 Long term (current) use of aromatase inhibitors: Secondary | ICD-10-CM

## 2023-10-02 DIAGNOSIS — Z87891 Personal history of nicotine dependence: Secondary | ICD-10-CM

## 2023-10-02 DIAGNOSIS — J4489 Other specified chronic obstructive pulmonary disease: Secondary | ICD-10-CM | POA: Diagnosis present

## 2023-10-02 LAB — CBC WITH DIFFERENTIAL/PLATELET
Abs Immature Granulocytes: 0.07 K/uL (ref 0.00–0.07)
Abs Immature Granulocytes: 0.09 K/uL — ABNORMAL HIGH (ref 0.00–0.07)
Basophils Absolute: 0 K/uL (ref 0.0–0.1)
Basophils Absolute: 0.1 K/uL (ref 0.0–0.1)
Basophils Relative: 1 %
Basophils Relative: 1 %
Eosinophils Absolute: 0.2 K/uL (ref 0.0–0.5)
Eosinophils Absolute: 0.3 K/uL (ref 0.0–0.5)
Eosinophils Relative: 5 %
Eosinophils Relative: 5 %
HCT: 32 % — ABNORMAL LOW (ref 36.0–46.0)
HCT: 33.1 % — ABNORMAL LOW (ref 36.0–46.0)
Hemoglobin: 10.7 g/dL — ABNORMAL LOW (ref 12.0–15.0)
Hemoglobin: 11 g/dL — ABNORMAL LOW (ref 12.0–15.0)
Immature Granulocytes: 2 %
Immature Granulocytes: 2 %
Lymphocytes Relative: 31 %
Lymphocytes Relative: 42 %
Lymphs Abs: 1.4 K/uL (ref 0.7–4.0)
Lymphs Abs: 2.1 K/uL (ref 0.7–4.0)
MCH: 28.5 pg (ref 26.0–34.0)
MCH: 28.4 pg (ref 26.0–34.0)
MCHC: 33.4 g/dL (ref 30.0–36.0)
MCHC: 33.2 g/dL (ref 30.0–36.0)
MCV: 85.1 fL (ref 80.0–100.0)
MCV: 85.3 fL (ref 80.0–100.0)
Monocytes Absolute: 0.6 K/uL (ref 0.1–1.0)
Monocytes Absolute: 0.7 K/uL (ref 0.1–1.0)
Monocytes Relative: 14 %
Monocytes Relative: 13 %
Neutro Abs: 2.2 K/uL (ref 1.7–7.7)
Neutro Abs: 1.9 K/uL (ref 1.7–7.7)
Neutrophils Relative %: 47 %
Neutrophils Relative %: 37 %
Platelets: 250 K/uL (ref 150–400)
Platelets: 258 K/uL (ref 150–400)
RBC: 3.76 MIL/uL — ABNORMAL LOW (ref 3.87–5.11)
RBC: 3.88 MIL/uL (ref 3.87–5.11)
RDW: 12.4 % (ref 11.5–15.5)
RDW: 12.3 % (ref 11.5–15.5)
WBC: 4.5 K/uL (ref 4.0–10.5)
WBC: 5.1 K/uL (ref 4.0–10.5)
nRBC: 0 % (ref 0.0–0.2)
nRBC: 0 % (ref 0.0–0.2)

## 2023-10-02 LAB — BASIC METABOLIC PANEL WITH GFR
Anion gap: 10 (ref 5–15)
Anion gap: 9 (ref 5–15)
Anion gap: 8 (ref 5–15)
BUN: 14 mg/dL (ref 8–23)
BUN: 14 mg/dL (ref 8–23)
BUN: 14 mg/dL (ref 8–23)
CO2: 26 mmol/L (ref 22–32)
CO2: 24 mmol/L (ref 22–32)
CO2: 24 mmol/L (ref 22–32)
Calcium: 9.9 mg/dL (ref 8.9–10.3)
Calcium: 9.1 mg/dL (ref 8.9–10.3)
Calcium: 9.1 mg/dL (ref 8.9–10.3)
Chloride: 101 mmol/L (ref 98–111)
Chloride: 100 mmol/L (ref 98–111)
Chloride: 102 mmol/L (ref 98–111)
Creatinine, Ser: 1.77 mg/dL — ABNORMAL HIGH (ref 0.44–1.00)
Creatinine, Ser: 1.8 mg/dL — ABNORMAL HIGH (ref 0.44–1.00)
Creatinine, Ser: 2.07 mg/dL — ABNORMAL HIGH (ref 0.44–1.00)
GFR, Estimated: 30 mL/min — ABNORMAL LOW (ref >60)
GFR, Estimated: 29 mL/min — ABNORMAL LOW (ref >60)
GFR, Estimated: 25 mL/min — ABNORMAL LOW (ref >60)
Glucose, Bld: 185 mg/dL — ABNORMAL HIGH (ref 70–99)
Glucose, Bld: 378 mg/dL — ABNORMAL HIGH (ref 70–99)
Glucose, Bld: 393 mg/dL — ABNORMAL HIGH (ref 70–99)
Potassium: 3.6 mmol/L (ref 3.5–5.1)
Potassium: 3.7 mmol/L (ref 3.5–5.1)
Potassium: 4.1 mmol/L (ref 3.5–5.1)
Sodium: 137 mmol/L (ref 135–145)
Sodium: 133 mmol/L — ABNORMAL LOW (ref 135–145)
Sodium: 134 mmol/L — ABNORMAL LOW (ref 135–145)

## 2023-10-02 LAB — COMPREHENSIVE METABOLIC PANEL WITH GFR
ALT: 28 U/L (ref 0–44)
AST: 23 U/L (ref 15–41)
Albumin: 3.6 g/dL (ref 3.5–5.0)
Alkaline Phosphatase: 175 U/L — ABNORMAL HIGH (ref 38–126)
Anion gap: 11 (ref 5–15)
BUN: 19 mg/dL (ref 8–23)
CO2: 21 mmol/L — ABNORMAL LOW (ref 22–32)
Calcium: 9.6 mg/dL (ref 8.9–10.3)
Chloride: 92 mmol/L — ABNORMAL LOW (ref 98–111)
Creatinine, Ser: 2.36 mg/dL — ABNORMAL HIGH (ref 0.44–1.00)
GFR, Estimated: 21 mL/min — ABNORMAL LOW (ref >60)
Glucose, Bld: 821 mg/dL (ref 70–99)
Potassium: 4.6 mmol/L (ref 3.5–5.1)
Sodium: 124 mmol/L — ABNORMAL LOW (ref 135–145)
Total Bilirubin: 0.9 mg/dL (ref 0.0–1.2)
Total Protein: 6.8 g/dL (ref 6.5–8.1)

## 2023-10-02 LAB — URINALYSIS, ROUTINE W REFLEX MICROSCOPIC
Bilirubin Urine: NEGATIVE
Glucose, UA: >=500 mg/dL — AB
Ketones, ur: NEGATIVE mg/dL
Nitrite: NEGATIVE
Protein, ur: NEGATIVE mg/dL
Specific Gravity, Urine: 1.01 (ref 1.005–1.030)
pH: 6 (ref 5.0–8.0)

## 2023-10-02 LAB — GLUCOSE, CAPILLARY: Glucose-Capillary: 437 mg/dL — ABNORMAL HIGH (ref 70–99)

## 2023-10-02 LAB — I-STAT VENOUS BLOOD GAS, ED
Acid-Base Excess: 1 mmol/L (ref 0.0–2.0)
Bicarbonate: 24.6 mmol/L (ref 20.0–28.0)
Calcium, Ion: 1.16 mmol/L (ref 1.15–1.40)
HCT: 34 % — ABNORMAL LOW (ref 36.0–46.0)
Hemoglobin: 11.6 g/dL — ABNORMAL LOW (ref 12.0–15.0)
O2 Saturation: 96 %
Potassium: 4.7 mmol/L (ref 3.5–5.1)
Sodium: 125 mmol/L — ABNORMAL LOW (ref 135–145)
TCO2: 26 mmol/L (ref 22–32)
pCO2, Ven: 34.9 mmHg — ABNORMAL LOW (ref 44–60)
pH, Ven: 7.457 — ABNORMAL HIGH (ref 7.25–7.43)
pO2, Ven: 79 mmHg — ABNORMAL HIGH (ref 32–45)

## 2023-10-02 LAB — OSMOLALITY: Osmolality: 307 mosm/kg — ABNORMAL HIGH (ref 275–295)

## 2023-10-02 LAB — CBC
HCT: 32.7 % — ABNORMAL LOW (ref 36.0–46.0)
Hemoglobin: 10.9 g/dL — ABNORMAL LOW (ref 12.0–15.0)
MCH: 28.6 pg (ref 26.0–34.0)
MCHC: 33.3 g/dL (ref 30.0–36.0)
MCV: 85.8 fL (ref 80.0–100.0)
Platelets: 270 K/uL (ref 150–400)
RBC: 3.81 MIL/uL — ABNORMAL LOW (ref 3.87–5.11)
RDW: 12.3 % (ref 11.5–15.5)
WBC: 4.1 K/uL (ref 4.0–10.5)
nRBC: 0 % (ref 0.0–0.2)

## 2023-10-02 LAB — CBG MONITORING, ED
Glucose-Capillary: >600 mg/dL (ref 70–99)
Glucose-Capillary: 342 mg/dL — ABNORMAL HIGH (ref 70–99)
Glucose-Capillary: 271 mg/dL — ABNORMAL HIGH (ref 70–99)
Glucose-Capillary: 200 mg/dL — ABNORMAL HIGH (ref 70–99)
Glucose-Capillary: 181 mg/dL — ABNORMAL HIGH (ref 70–99)
Glucose-Capillary: 176 mg/dL — ABNORMAL HIGH (ref 70–99)
Glucose-Capillary: 175 mg/dL — ABNORMAL HIGH (ref 70–99)
Glucose-Capillary: 342 mg/dL — ABNORMAL HIGH (ref 70–99)
Glucose-Capillary: 290 mg/dL — ABNORMAL HIGH (ref 70–99)

## 2023-10-02 LAB — I-STAT CG4 LACTIC ACID, ED: Lactic Acid, Venous: 1.2 mmol/L (ref 0.5–1.9)

## 2023-10-02 LAB — MAGNESIUM: Magnesium: 1.5 mg/dL — ABNORMAL LOW (ref 1.7–2.4)

## 2023-10-02 LAB — BETA-HYDROXYBUTYRIC ACID: Beta-Hydroxybutyric Acid: 0.29 mmol/L — ABNORMAL HIGH (ref 0.05–0.27)

## 2023-10-02 LAB — CK: Total CK: 58 U/L (ref 38–234)

## 2023-10-02 LAB — PHOSPHORUS: Phosphorus: 2.8 mg/dL (ref 2.5–4.6)

## 2023-10-02 MED ORDER — LACTATED RINGERS IV SOLN: INTRAVENOUS | Status: DC

## 2023-10-02 MED ORDER — INSULIN ASPART 100 UNIT/ML IJ SOLN
0.0000 [IU] | Freq: Three times a day (TID) | INTRAMUSCULAR | Status: DC
  Administered 2023-10-02: 11 [IU] via SUBCUTANEOUS
  Administered 2023-10-03: 3 [IU] via SUBCUTANEOUS
  Administered 2023-10-03: 5 [IU] via SUBCUTANEOUS
  Administered 2023-10-03: 3 [IU] via SUBCUTANEOUS
  Administered 2023-10-04: 2 [IU] via SUBCUTANEOUS

## 2023-10-02 MED ORDER — SENNOSIDES-DOCUSATE SODIUM 8.6-50 MG PO TABS: 1.0000 | ORAL_TABLET | Freq: Every evening | ORAL | Status: DC | PRN

## 2023-10-02 MED ORDER — LACTATED RINGERS IV BOLUS
1000.0000 mL | Freq: Once | INTRAVENOUS | Status: AC
  Administered 2023-10-02: 1000 mL via INTRAVENOUS

## 2023-10-02 MED ORDER — HYDRALAZINE HCL 20 MG/ML IJ SOLN: 10.0000 mg | INTRAMUSCULAR | Status: DC | PRN

## 2023-10-02 MED ORDER — INSULIN ASPART 100 UNIT/ML IJ SOLN: 0.0000 [IU] | Freq: Every day | INTRAMUSCULAR | Status: DC

## 2023-10-02 MED ORDER — ACETAMINOPHEN 325 MG PO TABS
650.0000 mg | ORAL_TABLET | Freq: Four times a day (QID) | ORAL | Status: DC | PRN
  Administered 2023-10-02: 650 mg via ORAL
  Filled 2023-10-02: qty 2

## 2023-10-02 MED ORDER — DEXTROSE 50 % IV SOLN: 0.0000 mL | INTRAVENOUS | Status: DC | PRN

## 2023-10-02 MED ORDER — NICOTINE 21 MG/24HR TD PT24: 21.0000 mg | MEDICATED_PATCH | Freq: Every day | TRANSDERMAL | Status: DC | PRN

## 2023-10-02 MED ORDER — ONDANSETRON HCL 4 MG/2ML IJ SOLN: 4.0000 mg | Freq: Four times a day (QID) | INTRAMUSCULAR | Status: DC | PRN

## 2023-10-02 MED ORDER — INSULIN ASPART 100 UNIT/ML IJ SOLN
10.0000 [IU] | Freq: Once | INTRAMUSCULAR | Status: AC
  Administered 2023-10-02: 10 [IU] via INTRAVENOUS

## 2023-10-02 MED ORDER — DEXTROSE IN LACTATED RINGERS 5 % IV SOLN: INTRAVENOUS | Status: DC

## 2023-10-02 MED ORDER — INSULIN ASPART 100 UNIT/ML IJ SOLN
2.0000 [IU] | Freq: Three times a day (TID) | INTRAMUSCULAR | Status: DC
  Administered 2023-10-03: 2 [IU] via SUBCUTANEOUS

## 2023-10-02 MED ORDER — INSULIN ASPART 100 UNIT/ML IJ SOLN
5.0000 [IU] | Freq: Once | INTRAMUSCULAR | Status: AC
  Administered 2023-10-02: 5 [IU] via SUBCUTANEOUS

## 2023-10-02 MED ORDER — ENOXAPARIN SODIUM 30 MG/0.3ML IJ SOSY
30.0000 mg | PREFILLED_SYRINGE | INTRAMUSCULAR | Status: DC
  Administered 2023-10-02 – 2023-10-04 (×3): 30 mg via SUBCUTANEOUS
  Filled 2023-10-02 (×3): qty 0.3

## 2023-10-02 MED ORDER — INSULIN GLARGINE 100 UNIT/ML ~~LOC~~ SOLN
15.0000 [IU] | Freq: Two times a day (BID) | SUBCUTANEOUS | Status: DC
  Administered 2023-10-02 – 2023-10-03 (×3): 15 [IU] via SUBCUTANEOUS
  Filled 2023-10-02 (×5): qty 0.15

## 2023-10-02 MED ORDER — INSULIN REGULAR(HUMAN) IN NACL 100-0.9 UT/100ML-% IV SOLN
INTRAVENOUS | Status: DC
  Administered 2023-10-02: 8 [IU]/h via INTRAVENOUS
  Filled 2023-10-02: qty 100

## 2023-10-02 MED ORDER — HYDROMORPHONE HCL 1 MG/ML IJ SOLN
0.5000 mg | INTRAMUSCULAR | Status: DC | PRN
  Administered 2023-10-03 (×2): 1 mg via INTRAVENOUS
  Filled 2023-10-02 (×2): qty 1

## 2023-10-02 MED ORDER — GLUCAGON HCL RDNA (DIAGNOSTIC) 1 MG IJ SOLR: 1.0000 mg | INTRAMUSCULAR | Status: DC | PRN

## 2023-10-02 MED ORDER — OXYCODONE HCL 5 MG PO TABS
5.0000 mg | ORAL_TABLET | ORAL | Status: DC | PRN
  Administered 2023-10-02: 5 mg via ORAL
  Filled 2023-10-02: qty 1

## 2023-10-02 MED ORDER — INSULIN GLARGINE-YFGN 100 UNIT/ML ~~LOC~~ SOLN: 15.0000 [IU] | Freq: Two times a day (BID) | SUBCUTANEOUS | Status: DC

## 2023-10-02 MED ORDER — FENTANYL CITRATE PF 50 MCG/ML IJ SOSY
50.0000 ug | PREFILLED_SYRINGE | Freq: Once | INTRAMUSCULAR | Status: AC
  Administered 2023-10-02: 50 ug via INTRAVENOUS
  Filled 2023-10-02: qty 1

## 2023-10-02 MED ORDER — INSULIN REGULAR(HUMAN) IN NACL 100-0.9 UT/100ML-% IV SOLN
INTRAVENOUS | Status: DC
  Administered 2023-10-02: 1.1 [IU]/h via INTRAVENOUS

## 2023-10-02 MED ORDER — IPRATROPIUM-ALBUTEROL 0.5-2.5 (3) MG/3ML IN SOLN: 3.0000 mL | RESPIRATORY_TRACT | Status: DC | PRN

## 2023-10-02 NOTE — ED Notes (Signed)
 Pt complaining of cramping in right lower leg 10/10 MD Rancour advised and reported to bedside.

## 2023-10-02 NOTE — Inpatient Diabetes Management (Signed)
 Inpatient Diabetes Program Recommendations  AACE/ADA: New Consensus Statement on Inpatient Glycemic Control (2015)  Target Ranges:  Prepandial:   less than 140 mg/dL      Peak postprandial:   less than 180 mg/dL (1-2 hours)      Critically ill patients:  140 - 180 mg/dL   Lab Results  Component Value Date   GLUCAP 342 (H) 10/02/2023   HGBA1C >15.5 (H) 09/28/2023    Review of Glycemic Control  Diabetes history: DM2 Outpatient Diabetes medications: Lantus  15 BID, Per PCP note on 8/21, glipizide  2.5 mg QAM listed as home medication Current orders for Inpatient glycemic control: Transitioned off IV insulin  to Lantus  15 units BID, Novolog  0-15 TID with meals and 0-5 HS + 2 units TID  HgbA1C - > 15.5%  Inpatient Diabetes Program Recommendations:    Consider decreasing Novolog  to 0-6 TID with meals (CKD IV)  Would benefit from simplified insulin  regimen with 1 injection per day and pt's son will help with insulin , if needed.   Spoke with pt at bedside regarding her diabetes control and HgbA1C. Pt is somewhat confused in ED, said she takes Lantus  10 BID, then said 15 BID, then said I just don't know. Could not answer questions about monitoring blood sugars at home.  Will need to speak with son prior to discharge.   Continue to follow.  Thank you. Shona Brandy, RD, LDN, CDCES Inpatient Diabetes Coordinator [phone]

## 2023-10-02 NOTE — H&P (Signed)
 History and Physical    [name] FMW:[id number] DOB: 1950-06-22 DOA: 10/02/2023  [facility]P: [facility] coming from: Home  Chief Complaint: Hyperglycemia  HPI: [name] is a 73 y.o. female with medical history significant of CKD stage IV, CAD status post PCI/DES, breast cancer status postmastectomy, HTN, COPD, gout, BRCA status postmastectomy comes to the hospital for hyperglycemia.  Patient was recently admitted here at [facility] from 8/21 - 8/23 for concerns of hyperglycemia and eventually discharged on Lantus  15 units twice daily.  Reportedly patient had routine blood work done on Tuesday and later on received a call from her doctor stating her blood glucose was reading greater than 800 therefore sent to the hospital. In the ER patient denies any complaints.  Tells me she has been taking her prescribed insulin  regimen. Lab work showed significant hyperglycemia with acute kidney injury and pseudohyponatremia.  Started on insulin  drip.   Review of Systems: As per HPI otherwise 10 point review of systems negative.  Review of Systems Otherwise negative except as per HPI, including: General: Denies fever, chills, night sweats or unintended weight loss. Resp: Denies cough, wheezing, shortness of breath. Cardiac: Denies chest pain, palpitations, orthopnea, paroxysmal nocturnal dyspnea. GI: Denies abdominal pain, nausea, vomiting, diarrhea or constipation GU: Denies dysuria, frequency, hesitancy or incontinence MS: Denies muscle aches, joint pain or swelling Neuro: Denies headache, neurologic deficits (focal weakness, numbness, tingling), abnormal gait Psych: Denies anxiety, depression, SI/HI/AVH Skin: Denies new rashes or lesions ID: Denies sick contacts, exotic exposures, travel  Past Medical History:  Diagnosis Date   Ambulates with cane    straight   Angina    no current problems per patient at PAT appt 11/05/18, nitroglycerin  sl 06/30/20   Arthritis     HANDS   Asthma    Breast cancer (HCC)    Cancer (HCC) 09/04/2017   Right Breast   Carpal tunnel syndrome, bilateral 11/26/2016   Cholelithiasis    Cocaine abuse (HCC) 07/2012   per E[place] drug screen   COPD (chronic obstructive pulmonary disease) (HCC)    per patient   Coronary artery disease    Diabetes mellitus without mention of complication    type 2   Dysphagia    esophageal dysmotility on 03/2011 esophagram    Fatty liver disease, nonalcoholic 2009   on Imaging.    GERD (gastroesophageal reflux disease)    Headache    Hearing loss    bilateral - no hearing aids   History of colonic polyps 2009   adenomatous 2009, HP in 2009, 2011, 2013.    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   Wears dentures    full upper and partial lower   Wears glasses     Past Surgical History:  Procedure Laterality Date   ABDOMINAL ANGIOGRAM  02/20/2011   Procedure: ABDOMINAL ANGIOGRAM;  Surgeon: Rober LOISE Chroman, MD;  Location: [facility] CATH LAB;  Service: Cardiovascular;;   BREAST BIOPSY Left    CARPAL TUNNEL RELEASE Right 07/04/2017   Procedure: RIGHT CARPAL TUNNEL RELEASE;  Surgeon: Murrell Drivers, MD;  Location: [facility];  Service: Orthopedics;  Laterality: Right;   CARPAL TUNNEL RELEASE Left 09/12/2017   Procedure: LEFT CARPAL TUNNEL RELEASE;  Surgeon: Murrell Drivers, MD;  Location: [facility];  Service: Orthopedics;  Laterality: Left;   CATARACT EXTRACTION     CHOLECYSTECTOMY N/A 11/11/2018   Procedure: ATTEMPTED LAPAROSCOPIC CHOLECUSTECOMY,  OPEN CHOLECYSTECTOMY;  Surgeon: Vernetta Berg, MD;  Location: [facility] OR;  Service: General;  Laterality: N/A;   COLONOSCOPY  11/30/2011   Procedure: COLONOSCOPY;  Surgeon: Princella CHRISTELLA Nida, MD;  Location: [facility];  Service: Endoscopy;  Laterality: N/A;   CORONARY  ANGIOPLASTY WITH STENT PLACEMENT  02/20/2011   ERCP N/A 10/03/2018   Procedure: ENDOSCOPIC RETROGRADE CHOLANGIOPANCREATOGRAPHY (ERCP);  Surgeon: Aneita Gwendlyn DASEN, MD;  Location: [facility];  Service: Endoscopy;  Laterality: N/A;   ESOPHAGOGASTRODUODENOSCOPY  11/30/2011   Procedure: ESOPHAGOGASTRODUODENOSCOPY (EGD);  Surgeon: Princella CHRISTELLA Nida, MD;  Location: [facility];  Service: Endoscopy;  Laterality: N/A;   ESOPHAGOGASTRODUODENOSCOPY N/A 02/23/2015   Procedure: ESOPHAGOGASTRODUODENOSCOPY (EGD);  Surgeon: Lupita FORBES Commander, MD;  Location: [facility]COPY;  Service: Endoscopy;  Laterality: N/A;   EYE SURGERY Bilateral    cataracts removed   INTRAOPERATIVE CHOLANGIOGRAM N/A 11/11/2018   Procedure: Intraoperative Cholangiogram;  Surgeon: Vernetta Berg, MD;  Location: [facility];  Service: General;  Laterality: N/A;   IR REMOVAL TUN ACCESS W/ PORT W/O FL MOD SED  06/30/2020   LEFT HEART CATHETERIZATION WITH CORONARY ANGIOGRAM N/A 02/20/2011   Procedure: LEFT HEART CATHETERIZATION WITH CORONARY ANGIOGRAM;  Surgeon: Rober LOISE Chroman, MD;  Location: [facility];  Service: Cardiovascular;  Laterality: N/A;   MASTECTOMY Right 11/05/2017   MASTECTOMY W/ SENTINEL NODE BIOPSY Right 11/27/2017   MASTECTOMY W/ SENTINEL NODE BIOPSY Right 11/27/2017   Procedure: RIGHT TOTAL MASTECTOMY WITH SENTINEL LYMPH NODE BIOPSY;  Surgeon: Mikell Katz, MD;  Location: [facility];  Service: General;  Laterality: Right;   PORT A CATH REVISION Left 05/07/2018   Procedure: PORT A CATH REVISION;  Surgeon: Mikell Katz, MD;  Location: [facility];  Service: General;  Laterality: Left;   PORTACATH PLACEMENT Left 11/27/2017   Procedure: INSERTION PORT-A-CATH;  Surgeon: Mikell Katz, MD;  Location: [facility];  Service: General;  Laterality: Left;   REVERSE SHOULDER ARTHROPLASTY Left 03/09/2021   Procedure: REVERSE SHOULDER ARTHROPLASTY;  Surgeon: Melita Drivers, MD;  Location: [facility];  Service: Orthopedics;  Laterality: Left;    RIGHT COLECTOMY  02/2007   for post polypectomy colonic perforation   SPHINCTEROTOMY  10/03/2018   Procedure: SPHINCTEROTOMY;  Surgeon: Aneita Gwendlyn DASEN, MD;  Location: [facility];  Service: Endoscopy;;   TUBAL LIGATION      SOCIAL HISTORY:  reports that she quit smoking about 13 years ago. Her smoking use included cigarettes. She started smoking about 60 years ago. She has a 11.8 pack-year smoking history. She has never used smokeless tobacco. She reports that she does not currently use alcohol. She reports that she does not currently use drugs after having used the following drugs: Cocaine.  No Known Allergies  FAMILY HISTORY: Family History  Problem Relation Age of Onset   Heart disease Mother    Diabetes Mother    Cancer Father        unsure what kind   Diabetes Sister    Colon cancer Neg Hx    Breast cancer Neg Hx      Prior to Admission medications   Medication Sig Start Date End Date Taking? Authorizing Provider  acetaminophen  (TYLENOL ) 650 MG CR tablet Take 650 mg by mouth every 8 (eight) hours as needed for pain.    [provider]  albuterol  (PROVENTIL ) (2.5 MG/3ML) 0.083% nebulizer solution Take 3 mLs (2.5 mg total) by  nebulization every 6 (six) hours as needed for wheezing or shortness of breath. 08/02/21   Vivienne Delon HERO, PA-C  albuterol  (VENTOLIN  HFA) 108 (90 Base) MCG/ACT inhaler Inhale 1-2 puffs into the lungs every 6 (six) hours as needed for wheezing or shortness of breath. 09/14/21   McClung, Angela M, PA-C  allopurinol  (ZYLOPRIM ) 100 MG tablet Take 100 mg by mouth daily.    [provider]  anastrozole  (ARIMIDEX ) 1 MG tablet TAKE ONE TABLET BY MOUTH ONCE DAILY 06/18/22   Odean Potts, MD  atorvastatin  (LIPITOR) 20 MG tablet Take 20 mg by mouth daily.    [provider]  diclofenac  Sodium (VOLTAREN ) 1 % GEL Apply 1 Application topically 4 (four) times daily. Patient taking differently: Apply 1 Application topically in the morning  and at bedtime. 09/13/21   Gudena, Vinay, MD  DULoxetine  (CYMBALTA ) 30 MG capsule Take 30 mg by mouth daily.    [provider]  feeding supplement, GLUCERNA SHAKE, (GLUCERNA SHAKE) LIQD Take 237 mLs by mouth 2 (two) times daily between meals. 06/02/23   Rosario Leatrice FERNS, MD  fluticasone  (FLONASE ) 50 MCG/ACT nasal spray Place 2 sprays into both nostrils daily. 05/10/21   Newlin, Enobong, MD  gabapentin  (NEURONTIN ) 100 MG capsule Take 100-200 mg by mouth See admin instructions. Take one tablet by mouth in the morning and afternoon, then take 2 tablets every night per patient 03/12/22   [provider]  hydrOXYzine  (ATARAX ) 25 MG tablet Take 50 mg by mouth at bedtime. 05/07/23   [provider]  insulin  glargine (LANTUS ) 100 UNIT/ML Solostar Pen Inject 15 Units into the skin 2 (two) times daily. 09/28/23   Lue Elsie BROCKS, MD  methocarbamol  (ROBAXIN ) 500 MG tablet Take 1 tablet (500 mg total) by mouth 2 (two) times daily. Patient taking differently: Take 500 mg by mouth every 8 (eight) hours as needed for muscle spasms. 03/09/23   Prosperi, Christian H, PA-C  nitroGLYCERIN  (NITROSTAT ) 0.4 MG SL tablet Place 1 tablet (0.4 mg total) under the tongue every 5 (five) minutes as needed for chest pain. 03/15/20   Fleming, Zelda W, NP  oxyCODONE -acetaminophen  (PERCOCET) 5-325 MG tablet Take 1 tablet by mouth every 4 (four) hours as needed for severe pain (pain score 7-10). 07/18/23 07/17/24  Danford, Lonni SQUIBB, MD  pantoprazole  (PROTONIX ) 40 MG tablet TAKE ONE TABLET BY MOUTH EVERY DAY FOR ACID REFLUX 11/02/21   Fleming, Zelda W, NP  polyethylene glycol (MIRALAX  / GLYCOLAX ) 17 g packet Take 17 g by mouth daily. 06/03/23   Rosario Leatrice FERNS, MD  potassium chloride  SA (KLOR-CON  M) 20 MEQ tablet Take 1 tablet (20 mEq total) by mouth daily for 5 days. 09/28/23 10/03/23  Lue Elsie BROCKS, MD  senna (SENOKOT) 8.6 MG TABS tablet Take 1 tablet (8.6 mg total) by mouth daily as needed for mild  constipation or moderate constipation. 06/02/23   Rosario Leatrice FERNS, MD    Physical Exam: Vitals:   10/02/23 0858 10/02/23 0930 10/02/23 1000 10/02/23 1030  BP:  124/76 109/74 100/65  Pulse:  81 87 77  Resp:  (!) 23 (!) 22 12  Temp: 97.7 F (36.5 C)     TempSrc: Oral     SpO2:  100% 97% 97%  Weight:      Height:          Constitutional: NAD, calm, comfortable Eyes: PERRL, lids and conjunctivae normal ENMT: Mucous membranes are moist. Posterior pharynx clear of any exudate or lesions.Normal dentition.  Neck: normal,  supple, no masses, no thyromegaly Respiratory: clear to auscultation bilaterally, no wheezing, no crackles. Normal respiratory effort. No accessory muscle use.  Cardiovascular: Regular rate and rhythm, no murmurs / rubs / gallops. No extremity edema. 2+ pedal pulses. No carotid bruits.  Abdomen: no tenderness, no masses palpated. No hepatosplenomegaly. Bowel sounds positive.  Musculoskeletal: no clubbing / cyanosis. No joint deformity upper and lower extremities. Good ROM, no contractures. Normal muscle tone.  Skin: no rashes, lesions, ulcers. No induration Neurologic: CN 2-12 grossly intact. Sensation intact, DTR normal. Strength 5/5 in all 4.  Psychiatric: Normal judgment and insight. Alert and oriented x 3. Normal mood.    Body mass index is 28.5 kg/m.      Labs on Admission: I have personally reviewed following labs and imaging studies  CBC: Recent Labs  Lab 09/26/23 1449 09/26/23 1533 09/27/23 0534 10/02/23 0438 10/02/23 0448  WBC 4.7  --  5.3 4.5  --   NEUTROABS 3.2  --  2.7 2.2  --   HGB 12.5 13.9 12.2 10.7* 11.6*  HCT 37.6 41.0 35.2* 32.0* 34.0*  MCV 86.0  --  82.8 85.1  --   PLT 312  --  280 250  --    Basic Metabolic Panel: Recent Labs  Lab 09/26/23 1733 09/26/23 2132 09/27/23 0305 09/27/23 0534 09/28/23 0511 10/02/23 0438 10/02/23 0448  NA 122* 132* 134* 134* 134* 124* 125*  K 4.4 3.5 3.7 3.3* 3.2* 4.6 4.7  CL 89* 99 102 106  107 92*  --   CO2 20* 20* 20* 20* 22 21*  --   GLUCOSE 919* 369* 281* 170* 145* 821*  --   BUN 37* 34* 31* 30* 26* 19  --   CREATININE 2.55* 2.42* 2.16* 2.09* 1.93* 2.36*  --   CALCIUM  9.3 9.8 9.4 9.3 9.2 9.6  --   MG 2.0  --   --  1.8  --   --   --   PHOS  --   --   --  2.7  --   --   --    GFR: Estimated Creatinine Clearance: 16.7 mL/min (A) (by C-G formula based on SCr of 2.36 mg/dL (H)). Liver Function Tests: Recent Labs  Lab 09/26/23 1449 09/27/23 0534 10/02/23 0438  AST 14* 14* 23  ALT 15 12 28   ALKPHOS 144* 104 175*  BILITOT 1.5* 1.1 0.9  PROT 7.5 6.8 6.8  ALBUMIN  3.7 3.2* 3.6   Recent Labs  Lab 09/26/23 1507  LIPASE 58*   No results for input(s): AMMONIA in the last 168 hours. Coagulation Profile: No results for input(s): INR, PROTIME in the last 168 hours. Cardiac Enzymes: Recent Labs  Lab 10/02/23 0646  CKTOTAL 58   BNP (last 3 results) No results for input(s): PROBNP in the last 8760 hours. HbA1C: No results for input(s): HGBA1C in the last 72 hours. CBG: Recent Labs  Lab 10/02/23 0430 10/02/23 0733 10/02/23 0846 10/02/23 0933 10/02/23 1054  GLUCAP >600* 342* 271* 200* 181*   Lipid Profile: No results for input(s): CHOL, HDL, LDLCALC, TRIG, CHOLHDL, LDLDIRECT in the last 72 hours. Thyroid  Function Tests: No results for input(s): TSH, T4TOTAL, FREET4, T3FREE, THYROIDAB in the last 72 hours. Anemia Panel: No results for input(s): VITAMINB12, FOLATE, FERRITIN, TIBC, IRON , RETICCTPCT in the last 72 hours. Urine analysis:    Component Value Date/Time   COLORURINE STRAW (A) 10/02/2023 0913   APPEARANCEUR CLEAR 10/02/2023 0913   LABSPEC 1.010 10/02/2023 0913   PHURINE 6.0 10/02/2023  0913   GLUCOSEU >=500 (A) 10/02/2023 0913   HGBUR SMALL (A) 10/02/2023 0913   BILIRUBINUR NEGATIVE 10/02/2023 0913   KETONESUR NEGATIVE 10/02/2023 0913   PROTEINUR NEGATIVE 10/02/2023 0913   UROBILINOGEN 0.2 03/31/2014  2017   NITRITE NEGATIVE 10/02/2023 0913   LEUKOCYTESUR MODERATE (A) 10/02/2023 0913   Sepsis Labs: !!!!!!!!!!!!!!!!!!!!!!!!!!!!!!!!!!!!!!!!!!!! @LABRCNTIP (procalcitonin:4,lacticidven:4) )No results found for this or any previous visit (from the past 240 hours).   Radiological Exams on Admission: DG Chest Portable 1 View Result Date: 10/02/2023 EXAM: 1 VIEW XRAY OF THE CHEST 10/02/2023 06:23:09 AM COMPARISON: 09/26/2023 CLINICAL HISTORY: Hyperglycemia. FINDINGS: LUNGS AND PLEURA: Mild atelectasis versus early airspace disease in the left lung base. No pleural effusion. No pneumothorax. HEART AND MEDIASTINUM: Aortic atherosclerotic calcification. BONES AND SOFT TISSUES: Status post left shoulder arthroplasty. Surgical clips noted in the right axilla. No acute osseous abnormality. IMPRESSION: 1. Mild atelectasis versus early airspace disease in the left lung base. Electronically signed by: Waddell Calk MD 10/02/2023 06:34 AM EDT RP Workstation: [id number]    Nutritional status  All images have been reviewed by me personally.  EKG: Independently reviewed.   Assessment/Plan Principal Problem:   Hyperglycemia    HHNK, severe hyperglycemia Insulin -dependent diabetes mellitus type 2 - No evidence of acidosis at this time.  Started on insulin  drip, blood sugar initially greater than 800 now slowly appears to be improving.  She is slowly starting to tolerate p.o. therefore will transition to subcu Lantus  15 units twice daily along with sliding scale and Premeal insulin .  Will adjust as necessary.  Diabetic coordinator consulted. Last A1c from recent admission >15  I have notified patient's RN regarding insulin  subcu transition from insulin  drip.  Mild renal insufficiency - Baseline creatinine 2.0, admission creatinine 2.36.  Getting IV fluids.  Pseudo hyponatremia - Should improve with hypoglycemia  History of CAD - Currently chest pain-free  History of COPD - As needed  bronchodilators  Essential hypertension - Not on any home medication  Preadmission med rec is currently pending   DVT prophylaxis: Lovenox  Code Status: Full code Family Communication:  Consults called: Diabetic coordinator Admission status: Observation progressive  Status is: Observation The patient remains OBS appropriate and will d/c before 2 midnights.   Time Spent: 65 minutes.  >50% of the time was devoted to discussing the patients care, assessment, plan and disposition with other care givers along with counseling the patient about the risks and benefits of treatment.    Burgess JAYSON Dare MD [facility]  If 7PM-7AM, please contact night-coverage   10/02/2023, 11:44 AM

## 2023-10-02 NOTE — Progress Notes (Signed)
 [id number]. Notified Dr Alfornia via secure chat and text page. Awaiting response.

## 2023-10-02 NOTE — ED Provider Notes (Signed)
 [place] EMERGENCY DEPARTMENT AT [facility] Provider Note   CSN: [id number] Arrival date & time: 10/02/23  9588     Patient presents with: Hyperglycemia   [name] is a 73 y.o. female.   73 y.o. female with past medical history  of depression, abdominal wall hematoma, CKD IV baseline Cr 2.0, DM type II, essential hypertension hyperlipidemia, gout history of anemia, COPD,CAD s/p remote stent, BrCA s/p mastectomy and prior history of HTN, Presents with hyperglycemia.  States she received a call from her doctor in the middle of the night reporting her blood sugar was high and she had to come to the hospital.  She was admitted to the hospital with hyperglycemia last week and had medication adjustments.  Reports she is taking Lantus  15 units twice daily.  Did take her insulin  last night and states she has been compliant.  Complains of a dry mouth and some nausea.  Denies chest pain, shortness of breath, abdominal pain, headache or fever.  Reports she has been taking her insulin  since she has been discharged.  The history is provided by the patient.  Hyperglycemia Associated symptoms: abdominal pain   Associated symptoms: no chest pain, no dizziness, no dysuria, no fever, no nausea, no shortness of breath, no vomiting and no weakness        Prior to Admission medications   Medication Sig Start Date End Date Taking? Authorizing Provider  acetaminophen  (TYLENOL ) 650 MG CR tablet Take 650 mg by mouth every 8 (eight) hours as needed for pain.    [provider]  albuterol  (PROVENTIL ) (2.5 MG/3ML) 0.083% nebulizer solution Take 3 mLs (2.5 mg total) by nebulization every 6 (six) hours as needed for wheezing or shortness of breath. 08/02/21   Vivienne Delon HERO, PA-C  albuterol  (VENTOLIN  HFA) 108 (90 Base) MCG/ACT inhaler Inhale 1-2 puffs into the lungs every 6 (six) hours as needed for wheezing or shortness of breath. 09/14/21   Danton Jon HERO, PA-C  allopurinol   (ZYLOPRIM ) 100 MG tablet Take 100 mg by mouth daily.    [provider]  anastrozole  (ARIMIDEX ) 1 MG tablet TAKE ONE TABLET BY MOUTH ONCE DAILY 06/18/22   Odean Potts, MD  atorvastatin  (LIPITOR) 20 MG tablet Take 20 mg by mouth daily.    [provider]  Blood Glucose Monitoring Suppl DEVI 1 each by Does not apply route in the morning, at noon, and at bedtime. May substitute to any manufacturer covered by patient's insurance. 09/27/23   Lue Elsie BROCKS, MD  Blood Pressure Monitor DEVI Please provide patient with insurance approved blood pressure monitor. I10.0 11/15/20   Fleming, Zelda W, NP  diclofenac  Sodium (VOLTAREN ) 1 % GEL Apply 1 Application topically 4 (four) times daily. Patient taking differently: Apply 1 Application topically in the morning and at bedtime. 09/13/21   Gudena, Vinay, MD  DULoxetine  (CYMBALTA ) 30 MG capsule Take 30 mg by mouth daily.    [provider]  feeding supplement, GLUCERNA SHAKE, (GLUCERNA SHAKE) LIQD Take 237 mLs by mouth 2 (two) times daily between meals. 06/02/23   Rosario Leatrice FERNS, MD  fluticasone  (FLONASE ) 50 MCG/ACT nasal spray Place 2 sprays into both nostrils daily. 05/10/21   Newlin, Enobong, MD  gabapentin  (NEURONTIN ) 100 MG capsule Take 100-200 mg by mouth See admin instructions. Take one tablet by mouth in the morning and afternoon, then take 2 tablets every night per patient 03/12/22   [provider]  Glucose Blood (BLOOD GLUCOSE TEST STRIPS) STRP 1  each by In Vitro route in the morning, at noon, and at bedtime. May substitute to any manufacturer covered by patient's insurance. 09/27/23 10/27/23  Lue Elsie BROCKS, MD  hydrOXYzine  (ATARAX ) 25 MG tablet Take 50 mg by mouth at bedtime. 05/07/23   [provider]  insulin  glargine (LANTUS ) 100 UNIT/ML Solostar Pen Inject 15 Units into the skin 2 (two) times daily. 09/28/23   Lue Elsie BROCKS, MD  Insulin  Pen Needle (INSUPEN PEN NEEDLES) 32G X 4 MM MISC Use as  directed with Lantus  Pen twice a day 09/28/23   Lue Elsie BROCKS, MD  Lancet Device MISC 1 each by Does not apply route in the morning, at noon, and at bedtime. May substitute to any manufacturer covered by patient's insurance. 09/27/23 10/27/23  Lue Elsie BROCKS, MD  Lancets Misc. MISC 1 each by Does not apply route in the morning, at noon, and at bedtime. May substitute to any manufacturer covered by patient's insurance. 09/27/23 10/27/23  Lue Elsie BROCKS, MD  methocarbamol  (ROBAXIN ) 500 MG tablet Take 1 tablet (500 mg total) by mouth 2 (two) times daily. Patient taking differently: Take 500 mg by mouth every 8 (eight) hours as needed for muscle spasms. 03/09/23   Prosperi, Christian H, PA-C  nitroGLYCERIN  (NITROSTAT ) 0.4 MG SL tablet Place 1 tablet (0.4 mg total) under the tongue every 5 (five) minutes as needed for chest pain. 03/15/20   Fleming, Zelda W, NP  oxyCODONE -acetaminophen  (PERCOCET) 5-325 MG tablet Take 1 tablet by mouth every 4 (four) hours as needed for severe pain (pain score 7-10). 07/18/23 07/17/24  Danford, Lonni SQUIBB, MD  pantoprazole  (PROTONIX ) 40 MG tablet TAKE ONE TABLET BY MOUTH EVERY DAY FOR ACID REFLUX 11/02/21   Fleming, Zelda W, NP  polyethylene glycol (MIRALAX  / GLYCOLAX ) 17 g packet Take 17 g by mouth daily. 06/03/23   Rosario Leatrice FERNS, MD  potassium chloride  SA (KLOR-CON  M) 20 MEQ tablet Take 1 tablet (20 mEq total) by mouth daily for 5 days. 09/28/23 10/03/23  Lue Elsie BROCKS, MD  Respiratory Therapy Supplies (NEBULIZER MASK ADULT) MISC 1 Units by Does not apply route daily as needed. ICD 10 J44.9 05/08/21   Fleming, Zelda W, NP  senna (SENOKOT) 8.6 MG TABS tablet Take 1 tablet (8.6 mg total) by mouth daily as needed for mild constipation or moderate constipation. 06/02/23   Rosario Leatrice FERNS, MD    Allergies: Patient has no known allergies.    Review of Systems  Constitutional:  Negative for activity change, appetite change and fever.  HENT:  Negative for  congestion.   Respiratory:  Negative for cough, chest tightness and shortness of breath.   Cardiovascular:  Negative for chest pain.  Gastrointestinal:  Positive for abdominal pain. Negative for nausea and vomiting.  Genitourinary:  Negative for dysuria and hematuria.  Musculoskeletal:  Negative for arthralgias and myalgias.  Skin:  Negative for rash.  Neurological:  Negative for dizziness, weakness, light-headedness and headaches.   all other systems are negative except as noted in the HPI and PMH.   Updated Vital Signs Pulse 78   Temp 98.6 F (37 C) (Oral)   Resp (!) 9   Ht 4' 11 (1.499 m)   Wt 64 kg   LMP  (LMP Unknown) Comment: tubal ligation  SpO2 99%   BMI 28.50 kg/m   Physical Exam Vitals and nursing note reviewed.  Constitutional:      General: She is not in acute distress.    Appearance: She is well-developed.  HENT:     Head: Normocephalic and atraumatic.     Mouth/Throat:     Mouth: Mucous membranes are dry.     Pharynx: No oropharyngeal exudate.  Eyes:     Conjunctiva/sclera: Conjunctivae normal.     Pupils: Pupils are equal, round, and reactive to light.  Neck:     Comments: No meningismus. Cardiovascular:     Rate and Rhythm: Normal rate and regular rhythm.     Heart sounds: Normal heart sounds. No murmur heard. Pulmonary:     Effort: Pulmonary effort is normal. No respiratory distress.     Breath sounds: Normal breath sounds.  Abdominal:     Palpations: Abdomen is soft.     Tenderness: There is no abdominal tenderness. There is no guarding or rebound.  Musculoskeletal:        General: No tenderness. Normal range of motion.     Cervical back: Normal range of motion and neck supple.  Skin:    General: Skin is warm.  Neurological:     Mental Status: She is alert and oriented to person, place, and time.     Cranial Nerves: No cranial nerve deficit.     Motor: No abnormal muscle tone.     Coordination: Coordination normal.     Comments:  5/5 strength  throughout. CN 2-12 intact.Equal grip strength.   Psychiatric:        Behavior: Behavior normal.     (all labs ordered are listed, but only abnormal results are displayed) Labs Reviewed  CBC WITH DIFFERENTIAL/PLATELET - Abnormal; Notable for the following components:      Result Value   RBC 3.76 (*)    Hemoglobin 10.7 (*)    HCT 32.0 (*)    All other components within normal limits  COMPREHENSIVE METABOLIC PANEL WITH GFR - Abnormal; Notable for the following components:   Sodium 124 (*)    Chloride 92 (*)    CO2 21 (*)    Glucose, Bld 821 (*)    Creatinine, Ser 2.36 (*)    Alkaline Phosphatase 175 (*)    GFR, Estimated 21 (*)    All other components within normal limits  BETA-HYDROXYBUTYRIC ACID - Abnormal; Notable for the following components:   Beta-Hydroxybutyric Acid 0.29 (*)    All other components within normal limits  CBG MONITORING, ED - Abnormal; Notable for the following components:   Glucose-Capillary >600 (*)    All other components within normal limits  I-STAT VENOUS BLOOD GAS, ED - Abnormal; Notable for the following components:   pH, Ven 7.457 (*)    pCO2, Ven 34.9 (*)    pO2, Ven 79 (*)    Sodium 125 (*)    HCT 34.0 (*)    Hemoglobin 11.6 (*)    All other components within normal limits  URINALYSIS, ROUTINE W REFLEX MICROSCOPIC  OSMOLALITY  CK  I-STAT CG4 LACTIC ACID, ED    EKG: EKG Interpretation Date/Time:  Wednesday October 02 2023 04:23:35 EDT Ventricular Rate:  82 PR Interval:  143 QRS Duration:  88 QT Interval:  381 QTC Calculation: 445 R Axis:   -37  Text Interpretation: Sinus rhythm Left axis deviation Consider anterior infarct No significant change was found Confirmed by Carita Senior [id number]) on 10/02/2023 4:39:21 AM  Radiology: DG Chest Portable 1 View Result Date: 10/02/2023 EXAM: 1 VIEW XRAY OF THE CHEST 10/02/2023 06:23:09 AM COMPARISON: 09/26/2023 CLINICAL HISTORY: Hyperglycemia. FINDINGS: LUNGS AND PLEURA: Mild atelectasis versus  early airspace disease in the  left lung base. No pleural effusion. No pneumothorax. HEART AND MEDIASTINUM: Aortic atherosclerotic calcification. BONES AND SOFT TISSUES: Status post left shoulder arthroplasty. Surgical clips noted in the right axilla. No acute osseous abnormality. IMPRESSION: 1. Mild atelectasis versus early airspace disease in the left lung base. Electronically signed by: Waddell Calk MD 10/02/2023 06:34 AM EDT RP Workstation: [id number]     .Critical Care  Performed by: Carita Senior, MD Authorized by: Carita Senior, MD   Critical care provider statement:    Critical care time (minutes):  45   Critical care time was exclusive of:  Separately billable procedures and treating other patients   Critical care was necessary to treat or prevent imminent or life-threatening deterioration of the following conditions:  Endocrine crisis   Critical care was time spent personally by me on the following activities:  Development of treatment plan with patient or surrogate, discussions with consultants, evaluation of patient's response to treatment, examination of patient, ordering and review of laboratory studies, ordering and review of radiographic studies, ordering and performing treatments and interventions, pulse oximetry, re-evaluation of patient's condition, review of old charts, blood draw for specimens and obtaining history from patient or surrogate   I assumed direction of critical care for this patient from another provider in my specialty: no     Care discussed with: admitting provider      Medications Ordered in the ED  lactated ringers  bolus 1,000 mL (has no administration in time range)                                    Medical Decision Making Amount and/or Complexity of Data Reviewed Independent Historian: EMS Labs: ordered. Decision-making details documented in ED Course. Radiology: ordered and independent interpretation performed. Decision-making details  documented in ED Course. ECG/medicine tests: ordered and independent interpretation performed. Decision-making details documented in ED Course.  Risk Prescription drug management. Decision regarding hospitalization.   Patient from home with hyperglycemia.  Reports compliance with her Lantus .  Vital signs are stable.  She is in no distress.  Abdomen is soft and nontender.  Complains of dry mouth only.  Per chart review sugars were difficult to control while she was hospitalized.  There is also question of compliance with diet.  Her insulin  regimen was simplified to Lantus  15 units twice daily.  Patient given IV fluids.  Blood sugar reading high.  Complains of dry mouth only.  Labs show hyperglycemia with normal anion gap.  Lactate is normal.  Sodium corrects to 136 from 124.  Glucose 821.  Creatinine has worsened to 2.3.  Will continue IV hydration and plan admission.  Discussed with Dr. Franky.  Requests insulin  drip.  Continue IV fluids.  She denies any abdominal pain but still has some nausea and dry mouth. Unclear etiology of her recurrent hyperglycemia.  May be due to noncompliance and indiscretions.  She has been on prednisone  for COPD quite frequently Admission discussed with Dr. Franky.  After admission patient developed cramping to her right leg it starts in her thigh and goes down to her foot.  DP and PT pulses intact with Doppler.  Foot warm and well-perfused.  Able to wiggle toes.  Will hydrate, check CK treat pain.     Final diagnoses:  Hyperglycemia  AKI (acute kidney injury) [facility])    ED Discharge Orders     None          Shaketha Jeon,  [place], MD 10/02/23 [phone]

## 2023-10-02 NOTE — ED Triage Notes (Signed)
 Pt BIB [facility] from home. Per pt, she had blood drawn on Tuesday and her doc called at 0300 and said she needed to come to the ER due to her CBG being higher than 800. Pt also reports taking the prescribed insulin  as ordered.  On arrival, pt states I feel normal. Pt a/o x4, denies CP/SOB

## 2023-10-03 ENCOUNTER — Encounter (HOSPITAL_COMMUNITY): Payer: Self-pay | Admitting: Internal Medicine

## 2023-10-03 DIAGNOSIS — J4489 Other specified chronic obstructive pulmonary disease: Secondary | ICD-10-CM | POA: Diagnosis present

## 2023-10-03 DIAGNOSIS — Z853 Personal history of malignant neoplasm of breast: Secondary | ICD-10-CM | POA: Diagnosis not present

## 2023-10-03 DIAGNOSIS — R739 Hyperglycemia, unspecified: Secondary | ICD-10-CM | POA: Diagnosis present

## 2023-10-03 DIAGNOSIS — N179 Acute kidney failure, unspecified: Secondary | ICD-10-CM | POA: Diagnosis present

## 2023-10-03 DIAGNOSIS — E119 Type 2 diabetes mellitus without complications: Secondary | ICD-10-CM | POA: Diagnosis not present

## 2023-10-03 DIAGNOSIS — Z96612 Presence of left artificial shoulder joint: Secondary | ICD-10-CM | POA: Diagnosis present

## 2023-10-03 DIAGNOSIS — K760 Fatty (change of) liver, not elsewhere classified: Secondary | ICD-10-CM | POA: Diagnosis present

## 2023-10-03 DIAGNOSIS — G629 Polyneuropathy, unspecified: Secondary | ICD-10-CM

## 2023-10-03 DIAGNOSIS — Z8249 Family history of ischemic heart disease and other diseases of the circulatory system: Secondary | ICD-10-CM | POA: Diagnosis not present

## 2023-10-03 DIAGNOSIS — E1122 Type 2 diabetes mellitus with diabetic chronic kidney disease: Secondary | ICD-10-CM | POA: Diagnosis present

## 2023-10-03 DIAGNOSIS — N184 Chronic kidney disease, stage 4 (severe): Secondary | ICD-10-CM | POA: Diagnosis present

## 2023-10-03 DIAGNOSIS — H9193 Unspecified hearing loss, bilateral: Secondary | ICD-10-CM | POA: Diagnosis present

## 2023-10-03 DIAGNOSIS — Z794 Long term (current) use of insulin: Secondary | ICD-10-CM | POA: Diagnosis not present

## 2023-10-03 DIAGNOSIS — Z955 Presence of coronary angioplasty implant and graft: Secondary | ICD-10-CM | POA: Diagnosis not present

## 2023-10-03 DIAGNOSIS — E1100 Type 2 diabetes mellitus with hyperosmolarity without nonketotic hyperglycemic-hyperosmolar coma (NKHHC): Secondary | ICD-10-CM

## 2023-10-03 DIAGNOSIS — E1140 Type 2 diabetes mellitus with diabetic neuropathy, unspecified: Secondary | ICD-10-CM | POA: Diagnosis present

## 2023-10-03 DIAGNOSIS — I252 Old myocardial infarction: Secondary | ICD-10-CM | POA: Diagnosis not present

## 2023-10-03 DIAGNOSIS — I2510 Atherosclerotic heart disease of native coronary artery without angina pectoris: Secondary | ICD-10-CM | POA: Diagnosis present

## 2023-10-03 DIAGNOSIS — E785 Hyperlipidemia, unspecified: Secondary | ICD-10-CM | POA: Diagnosis present

## 2023-10-03 DIAGNOSIS — G3184 Mild cognitive impairment, so stated: Secondary | ICD-10-CM | POA: Diagnosis present

## 2023-10-03 DIAGNOSIS — K219 Gastro-esophageal reflux disease without esophagitis: Secondary | ICD-10-CM | POA: Diagnosis present

## 2023-10-03 DIAGNOSIS — Z833 Family history of diabetes mellitus: Secondary | ICD-10-CM | POA: Diagnosis not present

## 2023-10-03 DIAGNOSIS — Z79811 Long term (current) use of aromatase inhibitors: Secondary | ICD-10-CM | POA: Diagnosis not present

## 2023-10-03 DIAGNOSIS — E1165 Type 2 diabetes mellitus with hyperglycemia: Secondary | ICD-10-CM | POA: Diagnosis present

## 2023-10-03 DIAGNOSIS — Z87891 Personal history of nicotine dependence: Secondary | ICD-10-CM | POA: Diagnosis not present

## 2023-10-03 DIAGNOSIS — I129 Hypertensive chronic kidney disease with stage 1 through stage 4 chronic kidney disease, or unspecified chronic kidney disease: Secondary | ICD-10-CM | POA: Diagnosis present

## 2023-10-03 LAB — CBC
HCT: 30.5 % — ABNORMAL LOW (ref 36.0–46.0)
Hemoglobin: 10.2 g/dL — ABNORMAL LOW (ref 12.0–15.0)
MCH: 28.4 pg (ref 26.0–34.0)
MCHC: 33.4 g/dL (ref 30.0–36.0)
MCV: 85 fL (ref 80.0–100.0)
Platelets: 282 K/uL (ref 150–400)
RBC: 3.59 MIL/uL — ABNORMAL LOW (ref 3.87–5.11)
RDW: 12.4 % (ref 11.5–15.5)
WBC: 5.9 K/uL (ref 4.0–10.5)
nRBC: 0 % (ref 0.0–0.2)

## 2023-10-03 LAB — BASIC METABOLIC PANEL WITH GFR
Anion gap: 8 (ref 5–15)
BUN: 14 mg/dL (ref 8–23)
CO2: 26 mmol/L (ref 22–32)
Calcium: 9.5 mg/dL (ref 8.9–10.3)
Chloride: 104 mmol/L (ref 98–111)
Creatinine, Ser: 2 mg/dL — ABNORMAL HIGH (ref 0.44–1.00)
GFR, Estimated: 26 mL/min — ABNORMAL LOW (ref >60)
Glucose, Bld: 224 mg/dL — ABNORMAL HIGH (ref 70–99)
Potassium: 4 mmol/L (ref 3.5–5.1)
Sodium: 138 mmol/L (ref 135–145)

## 2023-10-03 LAB — GLUCOSE, CAPILLARY
Glucose-Capillary: 156 mg/dL — ABNORMAL HIGH (ref 70–99)
Glucose-Capillary: 178 mg/dL — ABNORMAL HIGH (ref 70–99)
Glucose-Capillary: 196 mg/dL — ABNORMAL HIGH (ref 70–99)
Glucose-Capillary: 226 mg/dL — ABNORMAL HIGH (ref 70–99)
Glucose-Capillary: 284 mg/dL — ABNORMAL HIGH (ref 70–99)

## 2023-10-03 LAB — HEMOGLOBIN A1C
Hgb A1c MFr Bld: >15.5 % — ABNORMAL HIGH (ref 4.8–5.6)
Mean Plasma Glucose: >398 mg/dL

## 2023-10-03 LAB — MAGNESIUM: Magnesium: 1.3 mg/dL — ABNORMAL LOW (ref 1.7–2.4)

## 2023-10-03 MED ORDER — PANTOPRAZOLE SODIUM 40 MG PO TBEC
40.0000 mg | DELAYED_RELEASE_TABLET | Freq: Every day | ORAL | Status: DC
  Administered 2023-10-03 – 2023-10-04 (×2): 40 mg via ORAL
  Filled 2023-10-03 (×2): qty 1

## 2023-10-03 MED ORDER — GABAPENTIN 100 MG PO CAPS: 100.0000 mg | ORAL_CAPSULE | ORAL | Status: DC

## 2023-10-03 MED ORDER — GABAPENTIN 100 MG PO CAPS
200.0000 mg | ORAL_CAPSULE | Freq: Every day | ORAL | Status: DC
  Administered 2023-10-03: 200 mg via ORAL
  Filled 2023-10-03: qty 2

## 2023-10-03 MED ORDER — ANASTROZOLE 1 MG PO TABS
1.0000 mg | ORAL_TABLET | Freq: Every day | ORAL | Status: DC
  Administered 2023-10-03 – 2023-10-04 (×2): 1 mg via ORAL
  Filled 2023-10-03 (×2): qty 1

## 2023-10-03 MED ORDER — ALLOPURINOL 100 MG PO TABS
100.0000 mg | ORAL_TABLET | Freq: Two times a day (BID) | ORAL | Status: DC
  Administered 2023-10-03 – 2023-10-04 (×3): 100 mg via ORAL
  Filled 2023-10-03 (×3): qty 1

## 2023-10-03 MED ORDER — DIPHENHYDRAMINE HCL 25 MG PO CAPS
25.0000 mg | ORAL_CAPSULE | Freq: Once | ORAL | Status: AC | PRN
  Administered 2023-10-03: 25 mg via ORAL
  Filled 2023-10-03: qty 1

## 2023-10-03 MED ORDER — MAGNESIUM SULFATE 4 GM/100ML IV SOLN
4.0000 g | Freq: Once | INTRAVENOUS | Status: AC
  Administered 2023-10-03: 4 g via INTRAVENOUS
  Filled 2023-10-03: qty 100

## 2023-10-03 MED ORDER — GABAPENTIN 100 MG PO CAPS
100.0000 mg | ORAL_CAPSULE | Freq: Two times a day (BID) | ORAL | Status: DC
  Administered 2023-10-03 – 2023-10-04 (×2): 100 mg via ORAL
  Filled 2023-10-03 (×2): qty 1

## 2023-10-03 MED ORDER — DULOXETINE HCL 30 MG PO CPEP
30.0000 mg | ORAL_CAPSULE | Freq: Every day | ORAL | Status: DC
  Administered 2023-10-03 – 2023-10-04 (×2): 30 mg via ORAL
  Filled 2023-10-03 (×2): qty 1

## 2023-10-03 MED ORDER — INSULIN GLARGINE 100 UNIT/ML ~~LOC~~ SOLN
10.0000 [IU] | Freq: Once | SUBCUTANEOUS | Status: AC
  Administered 2023-10-03: 10 [IU] via SUBCUTANEOUS
  Filled 2023-10-03: qty 0.1

## 2023-10-03 MED ORDER — INSULIN ASPART 100 UNIT/ML IJ SOLN
4.0000 [IU] | Freq: Three times a day (TID) | INTRAMUSCULAR | Status: DC
  Administered 2023-10-03 – 2023-10-04 (×3): 4 [IU] via SUBCUTANEOUS

## 2023-10-03 MED ORDER — DIPHENHYDRAMINE HCL 25 MG PO CAPS
25.0000 mg | ORAL_CAPSULE | Freq: Three times a day (TID) | ORAL | Status: DC | PRN
  Administered 2023-10-03: 25 mg via ORAL
  Filled 2023-10-03: qty 1

## 2023-10-03 MED ORDER — FLUTICASONE PROPIONATE 50 MCG/ACT NA SUSP
2.0000 | Freq: Every day | NASAL | Status: DC
  Administered 2023-10-03 – 2023-10-04 (×2): 2 via NASAL
  Filled 2023-10-03: qty 16

## 2023-10-03 MED ORDER — ATORVASTATIN CALCIUM 10 MG PO TABS
20.0000 mg | ORAL_TABLET | Freq: Every day | ORAL | Status: DC
  Administered 2023-10-03 – 2023-10-04 (×2): 20 mg via ORAL
  Filled 2023-10-03 (×2): qty 2

## 2023-10-03 MED ORDER — MAGNESIUM SULFATE 2 GM/50ML IV SOLN
2.0000 g | Freq: Once | INTRAVENOUS | Status: AC
  Administered 2023-10-03: 2 g via INTRAVENOUS
  Filled 2023-10-03: qty 50

## 2023-10-03 MED ORDER — INSULIN GLARGINE 100 UNIT/ML ~~LOC~~ SOLN
30.0000 [IU] | Freq: Every day | SUBCUTANEOUS | Status: DC
  Filled 2023-10-03: qty 0.3

## 2023-10-03 NOTE — Inpatient Diabetes Management (Addendum)
 Inpatient Diabetes Program Recommendations  AACE/ADA: New Consensus Statement on Inpatient Glycemic Control (2015)  Target Ranges:  Prepandial:   less than 140 mg/dL      Peak postprandial:   less than 180 mg/dL (1-2 hours)      Critically ill patients:  140 - 180 mg/dL    Latest Reference Range & Units 09/28/23 05:11  Hemoglobin A1C 4.8 - 5.6 % >15.5 (H)  >398 mg/dl  (H): Data is abnormally high  Latest Reference Range & Units 10/02/23 08:46 10/02/23 09:33 10/02/23 10:54 10/02/23 11:55 10/02/23 13:08 10/02/23 16:41 10/02/23 18:02 10/02/23 21:00 10/03/23 00:05  Glucose-Capillary 70 - 99 mg/dL 728 (H) 799 (H)  IV Insulin  Drip Running 181 (H) 176 (H) 175 (H)  15 units Lantus  @1412  342 (H)  11 units Novolog   IV Insulin  Drip Stopped @1546  290 (H) 437 (H)  5 units Novolog   15 units Lantus  284 (H)  (H): Data is abnormally high  Latest Reference Range & Units 10/03/23 08:08  Glucose-Capillary 70 - 99 mg/dL 803 (H)  5 units Novolog    (H): Data is abnormally high   Admit with: Hyperglycemia  History: DM2, CKD  Home DM Meds: Lantus  15 units BID     Per PCP note on 8/21, glipizide  2.5 mg QAM listed as home medication   Current Orders: Lantus  15 units BID      Novolog  Moderate Correction Scale/ SSI (0-15 units) TID AC + HS       Novolog  2 units TID with meals   Started Novolog  Meal Coverage this AM  Can family help with Insulin  admin at home?  Notes from admission 08/21 thru 08/23:  Diabetes Coordinator recommended simple once daily basal insulin  regimen b/c pt lives alone Diabetes RN was not confident pt would be able to give insulin  successfully at home Discharged 08/23 with Rx for Lantus  15 units BID   Addendum 1:50pm--Went to see pt in her room.  There were two young children in the room (1 toddler and 1 pre-teen).  She was awake and alert but not able to give me much info about how much insulin  she should be taking at home, CBG checks at home, etc.  Pt gave me  permission to call her son Cordella to discuss care.  I called Pt's son Cordella around 2:05pm.  Did not get son to answer and phone never clicked over to leave VM.  Per discussion with Dr. Raenelle, plan will be to discharge pt home on simple daily regimen of once daily basal insulin .  Need to make sure someone can come to pt's home daily to administer the insulin  as I do not believe pt will be able to independently give herself insulin .      Discharge Recommendations: Long acting recommendations: Insulin  Glargine (LANTUS ) Solostar Pen dose to be determined, at present 15 units BID  Supply/Referral recommendations: Glucometer Test strips Lancet device Lancets Pen needles - standard   Use Adult Diabetes Insulin  Treatment Post Discharge order set.   --Will follow patient during hospitalization--  Adina Rudolpho Arrow RN, MSN, CDCES Diabetes Coordinator Inpatient Glycemic Control Team Team Pager: [phone] (8a-5p)

## 2023-10-03 NOTE — Plan of Care (Signed)
  Problem: Education: Goal: Ability to describe self-care measures that may prevent or decrease complications (Diabetes Survival Skills Education) will improve Outcome: Progressing   Problem: Coping: Goal: Ability to adjust to condition or change in health will improve Outcome: Progressing

## 2023-10-03 NOTE — Care Management Obs Status (Signed)
 MEDICARE OBSERVATION STATUS NOTIFICATION   Patient Details  Name: [name] MRN: [id number] Date of Birth: 01/04/1951   Medicare Observation Status Notification Given:  Yes Verbally reviewed Obs notice with Leotis Kitty at [phone]   Claretta Deed 10/03/2023, 1:41 PM

## 2023-10-03 NOTE — Progress Notes (Incomplete)
 Initial Nutrition Assessment  DOCUMENTATION CODES:      INTERVENTION:  Add Glucerna Shake po TID, each supplement provides 220 kcal and 10 grams of protein  Discussed importance of adherence to insulin  regimen and reviewed diet education Add MVI w/ minerals   NUTRITION DIAGNOSIS:     related to   as evidenced by  .   GOAL:       MONITOR:      REASON FOR ASSESSMENT:   Consult Assessment of nutrition requirement/status  ASSESSMENT:   Pt with PMH significant for: CKD IV, GERD, HLD, HTN, CAD (s/p PCI/DES), dysphagia, T2DM, COPD, breast cancer s/p mastectomy, and asthma. Presents with hyperglycemia. Found to have significant hyperglycemia, AKI and pseudohyponatremia. Noted with recent admission 8/21-8/23 for similar concerns r/t hyperglycemia.  Previous Admission 8/21 admitted 8/23 discharged on Lantus  15 BID Current Admission 8/27   Blood sugar over 800 on admission, however patient asymptomatic. Transitioned to subcutaneous insulin  from insulin  drip now that patient beginning to tolerate oral intake.   Average Meal Intake No documented meal intake for review   24 Hour Recall B: L: D: Snacks:   Admit/Current Weight: 64 kg  Per chart review, no significant weight loss in last 6-12 months. In fact, has shown 10% weight gain over last year. No significant edema documented or noted on exam. No skin breakdown present.  Meds: SS Novolog  0-15 TID, SS Novolog  2 TID, Lantus  15 BID  Has required magnesium  repletion. Blood sugars improving.    Labs:  Na+ 124--->138 (wdl) K+ 4.0 (wdl) Mg 1.5>1.3 (L) - repletion ordered CBGs 224-821 x24 hours A1c >15.5    NUTRITION - FOCUSED PHYSICAL EXAM:  {RD Focused Exam List:21252}  Diet Order:   Diet Order             Diet Carb Modified Fluid consistency: Thin; Room service appropriate? Yes  Diet effective now            EDUCATION NEEDS:      Skin:  Skin Assessment: Reviewed RN Assessment  Last BM:  PTA  (8/22?)  Height:  Ht Readings from Last 1 Encounters:  10/02/23 4' 11 (1.499 m)   Weight:  Wt Readings from Last 1 Encounters:  10/02/23 64 kg   Ideal Body Weight:     BMI:  Body mass index is 28.5 kg/m.  Estimated Nutritional Needs:   Kcal:     Protein:     Fluid:     Blair Deaner MS, RD, LDN Registered Dietitian Clinical Nutrition RD Inpatient Contact Info in Amion

## 2023-10-03 NOTE — Progress Notes (Signed)
 PROGRESS NOTE        PATIENT DETAILS Name: [name] Age: 73 y.o. Sex: female Date of Birth: 09-28-1950 Admit Date: 10/02/2023 Admitting Physician Burgess JAYSON Dare, MD ERE:[facility]  Brief Summary: Patient is a 73 y.o.  female who was recently hospitalized from 8/21-8/23 for hyperglycemic hyperosmolar nonketotic state-requiring insulin  infusion-discharged home on Lantus  15 units twice daily-presented to the hospital with recurrent hyperglycemic hyperosmolar nonketotic state.  Significant events: 8/27>> admit to [facility]  Significant studies: 8/27>> CXR: No obvious PNA.  Significant microbiology data: None  Procedures: None  Consults: None  Subjective: Poor historian-thinks she takes 8-10 units of insulin -once a day-sometimes she tells me she takes it twice a day.  Lives with grandson-however son sometimes comes in to help with her medications.  Objective: Vitals: Blood pressure 109/76, pulse 84, temperature 98.2 F (36.8 C), temperature source Oral, resp. rate 18, height 4' 11 (1.499 m), weight 64 kg, SpO2 98%.   Exam: Gen Exam:Alert awake-not in any distress HEENT:atraumatic, normocephalic Chest: B/L clear to auscultation anteriorly CVS:S1S2 regular Abdomen:soft non tender, non distended Extremities:no edema Neurology: Non focal Skin: no rash  Pertinent Labs/Radiology:    Latest Ref Rng & Units 10/03/2023   12:50 AM 10/02/2023    5:27 PM 10/02/2023    9:27 AM  CBC  WBC 4.0 - 10.5 K/uL 5.9  4.1  5.1   Hemoglobin 12.0 - 15.0 g/dL 89.7  89.0  88.9   Hematocrit 36.0 - 46.0 % 30.5  32.7  33.1   Platelets 150 - 400 K/uL 282  270  258     Lab Results  Component Value Date   NA 138 10/03/2023   K 4.0 10/03/2023   CL 104 10/03/2023   CO2 26 10/03/2023      Assessment/Plan: Hyperosmolar hyperglycemic nonketotic state Suspect secondary to noncompliance-elderly patient-probably some mild cognitive  dysfunction-probably misses a few doses of Lantus  insulin  which is scheduled twice daily.  Spoke with family-Sister/son-both confirm poor compliance (lives with bronwen does provide some supervision but it is mostly limited to once daily) Transition of insulin  drip-now on SQ insulin   DM-2 (A1c> 15.5 on 8/23) See above regarding concern for compliance Will switch Lantus  to daily regimen-to increase compliance/ease of use She has also received 15 units of Lantus  today-will give 10 units more for a total of  25 units today and see how she does-with plans to resume a total of 30 units daily starting tomorrow.  Since this is her second hospitalization in 1 week for the same reason-will keep her inpatient for another day to ensure CBG stable before we can discharge her home safely. Continue SSI Spoke with son-Gregory-he will provide more supervision at home.  Patient usually lives with grandson, but Cordella helps with medications etc.  CKD 4 Close to baseline  Pseudohyponatremia Secondary to hyperglycemia Resolved.  CAD No anginal symptoms  HLD Statin  COPD Stable As needed bronchodilators  GERD PPI  Peripheral neuropathy Neurontin /Cymbalta   History of breast cancer In remission Continue anastrozole   Cognitive dysfunction Appears mild Further workup deferred to the outpatient setting.  Code status:   Code Status: Full Code   DVT Prophylaxis: enoxaparin  (LOVENOX ) injection 30 mg Start: 10/02/23 0930 SCDs Start: 10/02/23 0929   Family Communication: Sister-Claris Andrews-[phone], son Cordella, [phone] on 8/28.   Disposition Plan: Status is: Observation The patient  will require care spanning > 2 midnights and should be moved to inpatient because: Severity of illness   Planned Discharge Destination:Home   Diet: Diet Order             Diet Carb Modified Fluid consistency: Thin; Room service appropriate? Yes  Diet effective now                      Antimicrobial agents: Anti-infectives (From admission, onward)    None        MEDICATIONS: Scheduled Meds:  enoxaparin  (LOVENOX ) injection  30 mg Subcutaneous Q24H   insulin  aspart  0-15 Units Subcutaneous TID WC   insulin  aspart  0-5 Units Subcutaneous QHS   insulin  aspart  4 Units Subcutaneous TID WC   insulin  glargine  10 Units Subcutaneous Once   [START ON 10/04/2023] insulin  glargine  30 Units Subcutaneous Daily   Continuous Infusions: PRN Meds:.acetaminophen , dextrose , glucagon  (human recombinant), hydrALAZINE , HYDROmorphone  (DILAUDID ) injection, ipratropium-albuterol , nicotine , ondansetron  (ZOFRAN ) IV, oxyCODONE , senna-docusate   I have personally reviewed following labs and imaging studies  LABORATORY DATA: CBC: Recent Labs  Lab 09/26/23 1449 09/26/23 1533 09/27/23 0534 10/02/23 0438 10/02/23 0448 10/02/23 0927 10/02/23 1727 10/03/23 0050  WBC 4.7  --  5.3 4.5  --  5.1 4.1 5.9  NEUTROABS 3.2  --  2.7 2.2  --  1.9  --   --   HGB 12.5   < > 12.2 10.7* 11.6* 11.0* 10.9* 10.2*  HCT 37.6   < > 35.2* 32.0* 34.0* 33.1* 32.7* 30.5*  MCV 86.0  --  82.8 85.1  --  85.3 85.8 85.0  PLT 312  --  280 250  --  258 270 282   < > = values in this interval not displayed.    Basic Metabolic Panel: Recent Labs  Lab 09/26/23 1733 09/26/23 2132 09/27/23 0534 09/28/23 0511 10/02/23 0438 10/02/23 0448 10/02/23 0927 10/02/23 1727 10/02/23 2218 10/03/23 0050  NA 122*   < > 134*   < > 124* 125* 137 133* 134* 138  K 4.4   < > 3.3*   < > 4.6 4.7 3.6 3.7 4.1 4.0  CL 89*   < > 106   < > 92*  --  101 100 102 104  CO2 20*   < > 20*   < > 21*  --  26 24 24 26   GLUCOSE 919*   < > 170*   < > 821*  --  185* 378* 393* 224*  BUN 37*   < > 30*   < > 19  --  14 14 14 14   CREATININE 2.55*   < > 2.09*   < > 2.36*  --  1.77* 1.80* 2.07* 2.00*  CALCIUM  9.3   < > 9.3   < > 9.6  --  9.9 9.1 9.1 9.5  MG 2.0  --  1.8  --   --   --  1.5*  --   --  1.3*  PHOS  --   --  2.7  --   --   --   2.8  --   --   --    < > = values in this interval not displayed.    GFR: Estimated Creatinine Clearance: 19.8 mL/min (A) (by C-G formula based on SCr of 2 mg/dL (H)).  Liver Function Tests: Recent Labs  Lab 09/26/23 1449 09/27/23 0534 10/02/23 0438  AST 14* 14* 23  ALT 15 12 28  ALKPHOS 144* 104 175*  BILITOT 1.5* 1.1 0.9  PROT 7.5 6.8 6.8  ALBUMIN  3.7 3.2* 3.6   Recent Labs  Lab 09/26/23 1507  LIPASE 58*   No results for input(s): AMMONIA in the last 168 hours.  Coagulation Profile: No results for input(s): INR, PROTIME in the last 168 hours.  Cardiac Enzymes: Recent Labs  Lab 10/02/23 0646  CKTOTAL 58    BNP (last 3 results) No results for input(s): PROBNP in the last 8760 hours.  Lipid Profile: No results for input(s): CHOL, HDL, LDLCALC, TRIG, CHOLHDL, LDLDIRECT in the last 72 hours.  Thyroid  Function Tests: No results for input(s): TSH, T4TOTAL, FREET4, T3FREE, THYROIDAB in the last 72 hours.  Anemia Panel: No results for input(s): VITAMINB12, FOLATE, FERRITIN, TIBC, IRON , RETICCTPCT in the last 72 hours.  Urine analysis:    Component Value Date/Time   COLORURINE STRAW (A) 10/02/2023 0913   APPEARANCEUR CLEAR 10/02/2023 0913   LABSPEC 1.010 10/02/2023 0913   PHURINE 6.0 10/02/2023 0913   GLUCOSEU >=500 (A) 10/02/2023 0913   HGBUR SMALL (A) 10/02/2023 0913   BILIRUBINUR NEGATIVE 10/02/2023 0913   KETONESUR NEGATIVE 10/02/2023 0913   PROTEINUR NEGATIVE 10/02/2023 0913   UROBILINOGEN 0.2 03/31/2014 2017   NITRITE NEGATIVE 10/02/2023 0913   LEUKOCYTESUR MODERATE (A) 10/02/2023 0913    Sepsis Labs: Lactic Acid, Venous    Component Value Date/Time   LATICACIDVEN 1.2 10/02/2023 0449    MICROBIOLOGY: No results found for this or any previous visit (from the past 240 hours).  RADIOLOGY STUDIES/RESULTS: DG Chest Portable 1 View Result Date: 10/02/2023 EXAM: 1 VIEW XRAY OF THE CHEST 10/02/2023 06:23:09  AM COMPARISON: 09/26/2023 CLINICAL HISTORY: Hyperglycemia. FINDINGS: LUNGS AND PLEURA: Mild atelectasis versus early airspace disease in the left lung base. No pleural effusion. No pneumothorax. HEART AND MEDIASTINUM: Aortic atherosclerotic calcification. BONES AND SOFT TISSUES: Status post left shoulder arthroplasty. Surgical clips noted in the right axilla. No acute osseous abnormality. IMPRESSION: 1. Mild atelectasis versus early airspace disease in the left lung base. Electronically signed by: Waddell Calk MD 10/02/2023 06:34 AM EDT RP Workstation: [id number]     LOS: 0 days   Donalda Applebaum, MD  [facility]    To contact the attending provider between 7A-7P or the covering provider during after hours 7P-7A, please log into the web site [email or web address] and access using universal [place] password for that web site. If you do not have the password, please call the hospital operator.  10/03/2023, 9:47 AM

## 2023-10-03 NOTE — Plan of Care (Signed)
  Problem: Metabolic: Goal: Ability to maintain appropriate glucose levels will improve Outcome: Progressing   Problem: Nutritional: Goal: Maintenance of adequate nutrition will improve Outcome: Progressing   Problem: Cardiac: Goal: Ability to maintain an adequate cardiac output will improve Outcome: Progressing   Problem: Clinical Measurements: Goal: Respiratory complications will improve Outcome: Progressing   Problem: Coping: Goal: Level of anxiety will decrease Outcome: Progressing   Problem: Pain Managment: Goal: General experience of comfort will improve and/or be controlled Outcome: Progressing

## 2023-10-04 ENCOUNTER — Other Ambulatory Visit (HOSPITAL_COMMUNITY): Payer: Self-pay

## 2023-10-04 DIAGNOSIS — Z794 Long term (current) use of insulin: Secondary | ICD-10-CM

## 2023-10-04 DIAGNOSIS — N179 Acute kidney failure, unspecified: Secondary | ICD-10-CM | POA: Diagnosis not present

## 2023-10-04 DIAGNOSIS — E119 Type 2 diabetes mellitus without complications: Secondary | ICD-10-CM | POA: Diagnosis not present

## 2023-10-04 LAB — CBC
HCT: 32.6 % — ABNORMAL LOW (ref 36.0–46.0)
Hemoglobin: 10.4 g/dL — ABNORMAL LOW (ref 12.0–15.0)
MCH: 28.1 pg (ref 26.0–34.0)
MCHC: 31.9 g/dL (ref 30.0–36.0)
MCV: 88.1 fL (ref 80.0–100.0)
Platelets: 276 K/uL (ref 150–400)
RBC: 3.7 MIL/uL — ABNORMAL LOW (ref 3.87–5.11)
RDW: 13 % (ref 11.5–15.5)
WBC: 6 K/uL (ref 4.0–10.5)
nRBC: 0 % (ref 0.0–0.2)

## 2023-10-04 LAB — GLUCOSE, CAPILLARY: Glucose-Capillary: 150 mg/dL — ABNORMAL HIGH (ref 70–99)

## 2023-10-04 LAB — MAGNESIUM: Magnesium: 2.4 mg/dL (ref 1.7–2.4)

## 2023-10-04 MED ORDER — LINAGLIPTIN 5 MG PO TABS
5.0000 mg | ORAL_TABLET | Freq: Every day | ORAL | 2 refills | Status: AC
  Filled 2023-10-04: qty 30, 30d supply, fill #0

## 2023-10-04 MED ORDER — INSULIN GLARGINE 100 UNIT/ML ~~LOC~~ SOLN
25.0000 [IU] | Freq: Every day | SUBCUTANEOUS | Status: DC
  Filled 2023-10-04: qty 0.25

## 2023-10-04 MED ORDER — LINAGLIPTIN 5 MG PO TABS
5.0000 mg | ORAL_TABLET | Freq: Every day | ORAL | Status: DC
  Filled 2023-10-04: qty 1

## 2023-10-04 MED ORDER — INSULIN GLARGINE 100 UNIT/ML SOLOSTAR PEN: 25.0000 [IU] | PEN_INJECTOR | Freq: Every day | SUBCUTANEOUS | Status: DC

## 2023-10-04 NOTE — Plan of Care (Signed)
  Problem: Education: Goal: Ability to describe self-care measures that may prevent or decrease complications (Diabetes Survival Skills Education) will improve Outcome: Completed/Met Goal: Individualized Educational Video(s) Outcome: Completed/Met   Problem: Coping: Goal: Ability to adjust to condition or change in health will improve Outcome: Completed/Met   Problem: Fluid Volume: Goal: Ability to maintain a balanced intake and output will improve Outcome: Completed/Met   Problem: Health Behavior/Discharge Planning: Goal: Ability to identify and utilize available resources and services will improve Outcome: Completed/Met Goal: Ability to manage health-related needs will improve Outcome: Completed/Met   Problem: Metabolic: Goal: Ability to maintain appropriate glucose levels will improve Outcome: Completed/Met   Problem: Nutritional: Goal: Maintenance of adequate nutrition will improve Outcome: Completed/Met Goal: Progress toward achieving an optimal weight will improve Outcome: Completed/Met   Problem: Skin Integrity: Goal: Risk for impaired skin integrity will decrease Outcome: Completed/Met   Problem: Tissue Perfusion: Goal: Adequacy of tissue perfusion will improve Outcome: Completed/Met   Problem: Education: Goal: Ability to describe self-care measures that may prevent or decrease complications (Diabetes Survival Skills Education) will improve Outcome: Completed/Met Goal: Individualized Educational Video(s) Outcome: Completed/Met   Problem: Cardiac: Goal: Ability to maintain an adequate cardiac output will improve Outcome: Completed/Met   Problem: Health Behavior/Discharge Planning: Goal: Ability to identify and utilize available resources and services will improve Outcome: Completed/Met Goal: Ability to manage health-related needs will improve Outcome: Completed/Met   Problem: Fluid Volume: Goal: Ability to achieve a balanced intake and output will  improve Outcome: Completed/Met   Problem: Metabolic: Goal: Ability to maintain appropriate glucose levels will improve Outcome: Completed/Met   Problem: Nutritional: Goal: Maintenance of adequate nutrition will improve Outcome: Completed/Met Goal: Maintenance of adequate weight for body size and type will improve Outcome: Completed/Met   Problem: Respiratory: Goal: Will regain and/or maintain adequate ventilation Outcome: Completed/Met   Problem: Urinary Elimination: Goal: Ability to achieve and maintain adequate renal perfusion and functioning will improve Outcome: Completed/Met   Problem: Education: Goal: Knowledge of General Education information will improve Description: Including pain rating scale, medication(s)/side effects and non-pharmacologic comfort measures Outcome: Completed/Met   Problem: Health Behavior/Discharge Planning: Goal: Ability to manage health-related needs will improve Outcome: Completed/Met   Problem: Clinical Measurements: Goal: Ability to maintain clinical measurements within normal limits will improve Outcome: Completed/Met Goal: Will remain free from infection Outcome: Completed/Met Goal: Diagnostic test results will improve Outcome: Completed/Met Goal: Respiratory complications will improve Outcome: Completed/Met Goal: Cardiovascular complication will be avoided Outcome: Completed/Met   Problem: Activity: Goal: Risk for activity intolerance will decrease Outcome: Completed/Met   Problem: Nutrition: Goal: Adequate nutrition will be maintained Outcome: Completed/Met   Problem: Coping: Goal: Level of anxiety will decrease Outcome: Completed/Met   Problem: Elimination: Goal: Will not experience complications related to bowel motility Outcome: Completed/Met Goal: Will not experience complications related to urinary retention Outcome: Completed/Met   Problem: Pain Managment: Goal: General experience of comfort will improve and/or  be controlled Outcome: Completed/Met   Problem: Safety: Goal: Ability to remain free from injury will improve Outcome: Completed/Met   Problem: Skin Integrity: Goal: Risk for impaired skin integrity will decrease Outcome: Completed/Met

## 2023-10-04 NOTE — Plan of Care (Signed)

## 2023-10-04 NOTE — Discharge Summary (Signed)
 PATIENT DETAILS Name: [name] Age: 73 y.o. Sex: female Date of Birth: Jan 30, 1951 MRN: [id number]. Admitting Physician: Burgess JAYSON Dare, MD [facility]  Admit Date: 10/02/2023 Discharge date: 10/04/2023  Recommendations for Outpatient Follow-up:  Follow up with PCP in 1-2 weeks Please obtain CMP/CBC in one week Appears to have some amount of mild chronic cognitive dysfunction-PCP to initiate further workup in the outpatient setting  Admitted From:  Home  Disposition: Home   Discharge Condition: good  CODE STATUS:   Code Status: Full Code   Diet recommendation:  Diet Order             Diet - low sodium heart healthy           Diet Carb Modified           Diet Carb Modified Fluid consistency: Thin; Room service appropriate? Yes  Diet effective now                    Brief Summary: Patient is a 73 y.o.  female who was recently hospitalized from 8/21-8/23 for hyperglycemic hyperosmolar nonketotic state-requiring insulin  infusion-discharged home on Lantus  15 units twice daily-presented to the hospital with recurrent hyperglycemic hyperosmolar nonketotic state.   Significant events: 8/27>> admit to [facility]   Significant studies: 8/27>> CXR: No obvious PNA.   Significant microbiology data: None   Procedures: None   Consults: None  Brief Hospital Course: Hyperosmolar hyperglycemic nonketotic state Suspect secondary to noncompliance-elderly patient-probably some mild cognitive dysfunction-probably misses a few doses of Lantus  insulin  which is scheduled twice daily.  Spoke with family-Sister/son-both confirm poor compliance (lives with bronwen does provide some supervision but it is mostly limited to once daily) Treated with IV insulin -IV fluids-has been transitioned to SQ insulin .   DM-2 (A1c> 15.5 on 8/23) See above regarding concern for compliance Due to issues with cognitive dysfunction-for ease of use-has been switched to  Lantus  once daily-sugars relatively stable with 25 units of Lantus .   Will add on Tradjenta  on discharge. Spoke with sister and then son Gregory-family to increase supervision-Ensure medication compliance. Follow-up with PCP for further optimization.  CKD 4 Close to baseline   Pseudohyponatremia Secondary to hyperglycemia Resolved.   CAD No anginal symptoms   HLD Statin   COPD Stable As needed bronchodilators   GERD PPI   Peripheral neuropathy Neurontin /Cymbalta    History of breast cancer In remission Continue anastrozole    Cognitive dysfunction Appears mild Further workup deferred to the outpatient setting.   Discharge Diagnoses:  Principal Problem:   Hyperglycemia   Discharge Instructions:  Activity:  As tolerated with Full fall precautions use walker/cane & assistance as needed   Discharge Instructions     Diet - low sodium heart healthy   Complete by: As directed    Diet Carb Modified   Complete by: As directed    Discharge instructions   Complete by: As directed    Follow with Primary MD  [facility] in 1-2 weeks  Please get a complete blood count and chemistry panel checked by your Primary MD at your next visit, and again as instructed by your Primary MD.  Get Medicines reviewed and adjusted: Please take all your medications with you for your next visit with your Primary MD  Laboratory/radiological data: Please request your Primary MD to go over all hospital tests and procedure/radiological results at the follow up, please ask your Primary MD to get all Hospital records sent to his/her office.  In some cases, they will be blood work, cultures and biopsy results pending at the time of your discharge. Please request that your primary care M.D. follows up on these results.  Also Note the following: If you experience worsening of your admission symptoms, develop shortness of breath, life threatening emergency, suicidal or  homicidal thoughts you must seek medical attention immediately by calling 911 or calling your MD immediately  if symptoms less severe.  You must read complete instructions/literature along with all the possible adverse reactions/side effects for all the Medicines you take and that have been prescribed to you. Take any new Medicines after you have completely understood and accpet all the possible adverse reactions/side effects.   Do not drive when taking Pain medications or sleeping medications (Benzodaizepines)  Do not take more than prescribed Pain, Sleep and Anxiety Medications. It is not advisable to combine anxiety,sleep and pain medications without talking with your primary care practitioner  Special Instructions: If you have smoked or chewed Tobacco  in the last 2 yrs please stop smoking, stop any regular Alcohol  and or any Recreational drug use.  Wear Seat belts while driving.  Please note: You were cared for by a hospitalist during your hospital stay. Once you are discharged, your primary care physician will handle any further medical issues. Please note that NO REFILLS for any discharge medications will be authorized once you are discharged, as it is imperative that you return to your primary care physician (or establish a relationship with a primary care physician if you do not have one) for your post hospital discharge needs so that they can reassess your need for medications and monitor your lab values.   Check your blood sugars multiple times a day-keep a record of these readings and take it to your next appointment with your PCP.   Increase activity slowly   Complete by: As directed       Allergies as of 10/04/2023   No Known Allergies      Medication List     TAKE these medications    acetaminophen  650 MG CR tablet Commonly known as: TYLENOL  Take 1,300 mg by mouth daily as needed for pain.   albuterol  (2.5 MG/3ML) 0.083% nebulizer solution Commonly known as:  PROVENTIL  Take 3 mLs (2.5 mg total) by nebulization every 6 (six) hours as needed for wheezing or shortness of breath.   albuterol  108 (90 Base) MCG/ACT inhaler Commonly known as: VENTOLIN  HFA Inhale 1-2 puffs into the lungs every 6 (six) hours as needed for wheezing or shortness of breath.   allopurinol  100 MG tablet Commonly known as: ZYLOPRIM  Take 100 mg by mouth 2 (two) times daily.   anastrozole  1 MG tablet Commonly known as: ARIMIDEX  TAKE ONE TABLET BY MOUTH ONCE DAILY   atorvastatin  20 MG tablet Commonly known as: LIPITOR Take 20 mg by mouth daily.   DULoxetine  30 MG capsule Commonly known as: CYMBALTA  Take 30 mg by mouth daily.   feeding supplement (GLUCERNA SHAKE) Liqd Take 237 mLs by mouth 2 (two) times daily between meals.   fluticasone  50 MCG/ACT nasal spray Commonly known as: FLONASE  Place 2 sprays into both nostrils daily.   gabapentin  100 MG capsule Commonly known as: NEURONTIN  Take 100-200 mg by mouth See admin instructions. Take one tablet by mouth in the morning and afternoon, then take 2 tablets every night per patient   hydrOXYzine  25 MG tablet Commonly known as: ATARAX  Take 50 mg by mouth at bedtime.   insulin  glargine  100 UNIT/ML Solostar Pen Commonly known as: LANTUS  Inject 25 Units into the skin daily. What changed:  how much to take when to take this   linagliptin  5 MG Tabs tablet Commonly known as: TRADJENTA  Take 1 tablet (5 mg total) by mouth daily.   methocarbamol  500 MG tablet Commonly known as: ROBAXIN  Take 1 tablet (500 mg total) by mouth 2 (two) times daily. What changed:  when to take this reasons to take this   nitroGLYCERIN  0.4 MG SL tablet Commonly known as: NITROSTAT  Place 1 tablet (0.4 mg total) under the tongue every 5 (five) minutes as needed for chest pain.   oxyCODONE -acetaminophen  5-325 MG tablet Commonly known as: Percocet Take 1 tablet by mouth every 4 (four) hours as needed for severe pain (pain score 7-10).    pantoprazole  40 MG tablet Commonly known as: PROTONIX  TAKE ONE TABLET BY MOUTH EVERY DAY FOR ACID REFLUX What changed: See the new instructions.   polyethylene glycol 17 g packet Commonly known as: MIRALAX  / GLYCOLAX  Take 17 g by mouth daily.   potassium chloride  SA 20 MEQ tablet Commonly known as: KLOR-CON  M Take 1 tablet (20 mEq total) by mouth daily for 5 days.   senna 8.6 MG Tabs tablet Commonly known as: SENOKOT Take 1 tablet (8.6 mg total) by mouth daily as needed for mild constipation or moderate constipation.        Follow-up Information     [facility]. Schedule an appointment as soon as possible for a visit in 1 week(s).   Contact information: [address] [place] [address] [phone]                No Known Allergies   Other Procedures/Studies: DG Chest Portable 1 View Result Date: 10/02/2023 EXAM: 1 VIEW XRAY OF THE CHEST 10/02/2023 06:23:09 AM COMPARISON: 09/26/2023 CLINICAL HISTORY: Hyperglycemia. FINDINGS: LUNGS AND PLEURA: Mild atelectasis versus early airspace disease in the left lung base. No pleural effusion. No pneumothorax. HEART AND MEDIASTINUM: Aortic atherosclerotic calcification. BONES AND SOFT TISSUES: Status post left shoulder arthroplasty. Surgical clips noted in the right axilla. No acute osseous abnormality. IMPRESSION: 1. Mild atelectasis versus early airspace disease in the left lung base. Electronically signed by: Waddell Calk MD 10/02/2023 06:34 AM EDT RP Workstation: [id number]   CT ABDOMEN PELVIS WO CONTRAST Result Date: 09/26/2023 CLINICAL DATA:  Abdominal pain, nausea vomiting. EXAM: CT ABDOMEN AND PELVIS WITHOUT CONTRAST TECHNIQUE: Multidetector CT imaging of the abdomen and pelvis was performed following the standard protocol without IV contrast. RADIATION DOSE REDUCTION: This exam was performed according to the departmental dose-optimization program which includes automated exposure control, adjustment  of the mA and/or kV according to patient size and/or use of iterative reconstruction technique. COMPARISON:  CT abdomen pelvis dated 09/26/2023. FINDINGS: Evaluation of this exam is limited in the absence of intravenous contrast as well as due to respiratory motion. Lower chest: Bibasilar subpleural atelectasis. No intra-abdominal free air or free fluid. Hepatobiliary: The liver is unremarkable. No biliary dilatation. Cholecystectomy. No retained calcified stone noted in the central CBD. Pancreas: Unremarkable. No pancreatic ductal dilatation or surrounding inflammatory changes. Spleen: Normal in size without focal abnormality. Adrenals/Urinary Tract: The adrenal glands unremarkable. There is no hydronephrosis or nephrolithiasis on either side. Indeterminate hypodense lesions in the left kidney not evaluated on this CT, possibly cysts. This can be better evaluated with ultrasound on a nonemergent/outpatient basis. The visualized ureters and urinary bladder appear unremarkable. Stomach/Bowel: There is postsurgical changes of bowel with anastomotic  staple line in the right lower quadrant. There is no bowel obstruction or active inflammation. Appendectomy. Vascular/Lymphatic: Moderate aortoiliac atherosclerotic disease. The IVC is unremarkable. No portal venous gas. There is no adenopathy. Reproductive: Small calcified fibroid. No suspicious adnexal masses. Other: Midline vertical anterior pelvic wall incisional scar. Musculoskeletal: Degenerative changes of the spine. No acute osseous pathology. IMPRESSION: 1. No acute intra-abdominal or pelvic pathology. 2. Postsurgical changes of bowel. No bowel obstruction. 3.  Aortic Atherosclerosis (ICD10-I70.0). Electronically Signed   By: Vanetta Chou M.D.   On: 09/26/2023 18:39   DG Chest Portable 1 View Result Date: 09/26/2023 CLINICAL DATA:  Productive cough. EXAM: PORTABLE CHEST 1 VIEW COMPARISON:  07/17/2023 FINDINGS: Poor inspiration. Stable borderline enlarged  cardiac silhouette. Clear lungs with normal vascularity. Tortuous and partially calcified thoracic aorta. Left shoulder prosthesis. IMPRESSION: No acute abnormality. Electronically Signed   By: Elspeth Bathe M.D.   On: 09/26/2023 15:36     TODAY-DAY OF DISCHARGE:  Subjective:   Hargis Molt today has no headache,no chest abdominal pain,no new weakness tingling or numbness, feels much better wants to go home today.   Objective:   Blood pressure (!) 147/82, pulse 87, temperature 98.3 F (36.8 C), temperature source Oral, resp. rate 14, height 4' 11 (1.499 m), weight 58.3 kg, SpO2 96%.  Intake/Output Summary (Last 24 hours) at 10/04/2023 0820 Last data filed at 10/04/2023 0410 Gross per 24 hour  Intake 120 ml  Output --  Net 120 ml   Filed Weights   10/02/23 0420 10/04/23 0401  Weight: 64 kg 58.3 kg    Exam: Awake Alert, Oriented *3, No new F.N deficits, Normal affect [place].AT,PERRAL Supple Neck,No JVD, No cervical lymphadenopathy appriciated.  Symmetrical Chest wall movement, Good air movement bilaterally, CTAB RRR,No Gallops,Rubs or new Murmurs, No Parasternal Heave +ve B.Sounds, Abd Soft, Non tender, No organomegaly appriciated, No rebound -guarding or rigidity. No Cyanosis, Clubbing or edema, No new Rash or bruise   PERTINENT RADIOLOGIC STUDIES: No results found.   PERTINENT LAB RESULTS: CBC: Recent Labs    10/03/23 0050 10/04/23 0541  WBC 5.9 6.0  HGB 10.2* 10.4*  HCT 30.5* 32.6*  PLT 282 276   CMET CMP     Component Value Date/Time   NA 138 10/03/2023 0050   NA 141 05/10/2021 1104   K 4.0 10/03/2023 0050   CL 104 10/03/2023 0050   CO2 26 10/03/2023 0050   GLUCOSE 224 (H) 10/03/2023 0050   BUN 14 10/03/2023 0050   BUN 25 05/10/2021 1104   CREATININE 2.00 (H) 10/03/2023 0050   CREATININE 2.00 (H) 04/22/2023 1336   CALCIUM  9.5 10/03/2023 0050   PROT 6.8 10/02/2023 0438   PROT 7.7 05/10/2021 1104   ALBUMIN  3.6 10/02/2023 0438   ALBUMIN  4.3 05/10/2021  1104   AST 23 10/02/2023 0438   AST 11 (L) 04/22/2023 1336   ALT 28 10/02/2023 0438   ALT 7 04/22/2023 1336   ALKPHOS 175 (H) 10/02/2023 0438   BILITOT 0.9 10/02/2023 0438   BILITOT 0.6 04/22/2023 1336   EGFR 23 (L) 05/10/2021 1104   GFRNONAA 26 (L) 10/03/2023 0050   GFRNONAA 26 (L) 04/22/2023 1336    GFR Estimated Creatinine Clearance: 18.9 mL/min (A) (by C-G formula based on SCr of 2 mg/dL (H)). No results for input(s): LIPASE, AMYLASE in the last 72 hours. Recent Labs    10/02/23 0646  CKTOTAL 58   Invalid input(s): POCBNP No results for input(s): DDIMER in the last 72 hours. Recent Labs  10/02/23 0928  HGBA1C >15.5*   No results for input(s): CHOL, HDL, LDLCALC, TRIG, CHOLHDL, LDLDIRECT in the last 72 hours. No results for input(s): TSH, T4TOTAL, T3FREE, THYROIDAB in the last 72 hours.  Invalid input(s): FREET3 No results for input(s): VITAMINB12, FOLATE, FERRITIN, TIBC, IRON , RETICCTPCT in the last 72 hours. Coags: No results for input(s): INR in the last 72 hours.  Invalid input(s): PT Microbiology: No results found for this or any previous visit (from the past 240 hours).  FURTHER DISCHARGE INSTRUCTIONS:  Get Medicines reviewed and adjusted: Please take all your medications with you for your next visit with your Primary MD  Laboratory/radiological data: Please request your Primary MD to go over all hospital tests and procedure/radiological results at the follow up, please ask your Primary MD to get all Hospital records sent to his/her office.  In some cases, they will be blood work, cultures and biopsy results pending at the time of your discharge. Please request that your primary care M.D. goes through all the records of your hospital data and follows up on these results.  Also Note the following: If you experience worsening of your admission symptoms, develop shortness of breath, life threatening emergency,  suicidal or homicidal thoughts you must seek medical attention immediately by calling 911 or calling your MD immediately  if symptoms less severe.  You must read complete instructions/literature along with all the possible adverse reactions/side effects for all the Medicines you take and that have been prescribed to you. Take any new Medicines after you have completely understood and accpet all the possible adverse reactions/side effects.   Do not drive when taking Pain medications or sleeping medications (Benzodaizepines)  Do not take more than prescribed Pain, Sleep and Anxiety Medications. It is not advisable to combine anxiety,sleep and pain medications without talking with your primary care practitioner  Special Instructions: If you have smoked or chewed Tobacco  in the last 2 yrs please stop smoking, stop any regular Alcohol  and or any Recreational drug use.  Wear Seat belts while driving.  Please note: You were cared for by a hospitalist during your hospital stay. Once you are discharged, your primary care physician will handle any further medical issues. Please note that NO REFILLS for any discharge medications will be authorized once you are discharged, as it is imperative that you return to your primary care physician (or establish a relationship with a primary care physician if you do not have one) for your post hospital discharge needs so that they can reassess your need for medications and monitor your lab values.  Total Time spent coordinating discharge including counseling, education and face to face time equals greater than 30 minutes.  Signed: Hessie Varone 10/04/2023 8:20 AM

## 2023-10-06 ENCOUNTER — Emergency Department (HOSPITAL_COMMUNITY)

## 2023-10-06 ENCOUNTER — Encounter (HOSPITAL_COMMUNITY): Payer: Self-pay | Admitting: Internal Medicine

## 2023-10-06 ENCOUNTER — Other Ambulatory Visit: Payer: Self-pay

## 2023-10-06 ENCOUNTER — Observation Stay (HOSPITAL_COMMUNITY)
Admission: EM | Admit: 2023-10-06 | Discharge: 2023-10-07 | Disposition: A | Discharge status: Home / Self Care | Attending: Internal Medicine | Admitting: Internal Medicine

## 2023-10-06 DIAGNOSIS — J189 Pneumonia, unspecified organism: Secondary | ICD-10-CM | POA: Diagnosis not present

## 2023-10-06 DIAGNOSIS — Z79899 Other long term (current) drug therapy: Secondary | ICD-10-CM | POA: Insufficient documentation

## 2023-10-06 DIAGNOSIS — K219 Gastro-esophageal reflux disease without esophagitis: Secondary | ICD-10-CM | POA: Diagnosis not present

## 2023-10-06 DIAGNOSIS — N184 Chronic kidney disease, stage 4 (severe): Secondary | ICD-10-CM | POA: Diagnosis not present

## 2023-10-06 DIAGNOSIS — C50411 Malignant neoplasm of upper-outer quadrant of right female breast: Secondary | ICD-10-CM | POA: Diagnosis not present

## 2023-10-06 DIAGNOSIS — J449 Chronic obstructive pulmonary disease, unspecified: Secondary | ICD-10-CM | POA: Insufficient documentation

## 2023-10-06 DIAGNOSIS — I2510 Atherosclerotic heart disease of native coronary artery without angina pectoris: Secondary | ICD-10-CM | POA: Diagnosis not present

## 2023-10-06 DIAGNOSIS — E1122 Type 2 diabetes mellitus with diabetic chronic kidney disease: Secondary | ICD-10-CM | POA: Diagnosis not present

## 2023-10-06 DIAGNOSIS — I10 Essential (primary) hypertension: Secondary | ICD-10-CM | POA: Diagnosis present

## 2023-10-06 DIAGNOSIS — I129 Hypertensive chronic kidney disease with stage 1 through stage 4 chronic kidney disease, or unspecified chronic kidney disease: Secondary | ICD-10-CM | POA: Diagnosis not present

## 2023-10-06 DIAGNOSIS — R739 Hyperglycemia, unspecified: Secondary | ICD-10-CM | POA: Diagnosis present

## 2023-10-06 DIAGNOSIS — D649 Anemia, unspecified: Secondary | ICD-10-CM | POA: Diagnosis not present

## 2023-10-06 DIAGNOSIS — E785 Hyperlipidemia, unspecified: Secondary | ICD-10-CM | POA: Insufficient documentation

## 2023-10-06 DIAGNOSIS — E1100 Type 2 diabetes mellitus with hyperosmolarity without nonketotic hyperglycemic-hyperosmolar coma (NKHHC): Principal | ICD-10-CM | POA: Diagnosis present

## 2023-10-06 DIAGNOSIS — Z794 Long term (current) use of insulin: Secondary | ICD-10-CM | POA: Insufficient documentation

## 2023-10-06 DIAGNOSIS — F32A Depression, unspecified: Secondary | ICD-10-CM | POA: Insufficient documentation

## 2023-10-06 DIAGNOSIS — E1140 Type 2 diabetes mellitus with diabetic neuropathy, unspecified: Secondary | ICD-10-CM | POA: Diagnosis not present

## 2023-10-06 DIAGNOSIS — E119 Type 2 diabetes mellitus without complications: Principal | ICD-10-CM

## 2023-10-06 DIAGNOSIS — M109 Gout, unspecified: Secondary | ICD-10-CM | POA: Diagnosis present

## 2023-10-06 DIAGNOSIS — Z87891 Personal history of nicotine dependence: Secondary | ICD-10-CM | POA: Diagnosis not present

## 2023-10-06 LAB — BASIC METABOLIC PANEL WITH GFR
Anion gap: 8 (ref 5–15)
BUN: 11 mg/dL (ref 8–23)
CO2: 22 mmol/L (ref 22–32)
Calcium: 8.9 mg/dL (ref 8.9–10.3)
Chloride: 102 mmol/L (ref 98–111)
Creatinine, Ser: 2.04 mg/dL — ABNORMAL HIGH (ref 0.44–1.00)
GFR, Estimated: 25 mL/min — ABNORMAL LOW (ref >60)
Glucose, Bld: 482 mg/dL — ABNORMAL HIGH (ref 70–99)
Potassium: 4.2 mmol/L (ref 3.5–5.1)
Sodium: 132 mmol/L — ABNORMAL LOW (ref 135–145)

## 2023-10-06 LAB — URINALYSIS, ROUTINE W REFLEX MICROSCOPIC
Bilirubin Urine: NEGATIVE
Glucose, UA: >=500 mg/dL — AB
Hgb urine dipstick: NEGATIVE
Ketones, ur: NEGATIVE mg/dL
Nitrite: NEGATIVE
Protein, ur: NEGATIVE mg/dL
Specific Gravity, Urine: 1.014 (ref 1.005–1.030)
pH: 6 (ref 5.0–8.0)

## 2023-10-06 LAB — CBC
HCT: 30.8 % — ABNORMAL LOW (ref 36.0–46.0)
Hemoglobin: 9.9 g/dL — ABNORMAL LOW (ref 12.0–15.0)
MCH: 29.4 pg (ref 26.0–34.0)
MCHC: 32.1 g/dL (ref 30.0–36.0)
MCV: 91.4 fL (ref 80.0–100.0)
Platelets: 272 K/uL (ref 150–400)
RBC: 3.37 MIL/uL — ABNORMAL LOW (ref 3.87–5.11)
RDW: 13.4 % (ref 11.5–15.5)
WBC: 6.3 K/uL (ref 4.0–10.5)
nRBC: 0 % (ref 0.0–0.2)

## 2023-10-06 LAB — COMPREHENSIVE METABOLIC PANEL WITH GFR
ALT: 18 U/L (ref 0–44)
AST: 26 U/L (ref 15–41)
Albumin: 3.6 g/dL (ref 3.5–5.0)
Alkaline Phosphatase: 125 U/L (ref 38–126)
Anion gap: 12 (ref 5–15)
BUN: 12 mg/dL (ref 8–23)
CO2: 22 mmol/L (ref 22–32)
Calcium: 9.3 mg/dL (ref 8.9–10.3)
Chloride: 96 mmol/L — ABNORMAL LOW (ref 98–111)
Creatinine, Ser: 2.23 mg/dL — ABNORMAL HIGH (ref 0.44–1.00)
GFR, Estimated: 22 mL/min — ABNORMAL LOW (ref >60)
Glucose, Bld: 658 mg/dL (ref 70–99)
Potassium: 4.4 mmol/L (ref 3.5–5.1)
Sodium: 130 mmol/L — ABNORMAL LOW (ref 135–145)
Total Bilirubin: 0.7 mg/dL (ref 0.0–1.2)
Total Protein: 7 g/dL (ref 6.5–8.1)

## 2023-10-06 LAB — CBC WITH DIFFERENTIAL/PLATELET
Abs Immature Granulocytes: 0.1 K/uL — ABNORMAL HIGH (ref 0.00–0.07)
Basophils Absolute: 0 K/uL (ref 0.0–0.1)
Basophils Relative: 1 %
Eosinophils Absolute: 0.2 K/uL (ref 0.0–0.5)
Eosinophils Relative: 3 %
HCT: 27.7 % — ABNORMAL LOW (ref 36.0–46.0)
Hemoglobin: 8.8 g/dL — ABNORMAL LOW (ref 12.0–15.0)
Immature Granulocytes: 2 %
Lymphocytes Relative: 24 %
Lymphs Abs: 1.5 K/uL (ref 0.7–4.0)
MCH: 29 pg (ref 26.0–34.0)
MCHC: 31.8 g/dL (ref 30.0–36.0)
MCV: 91.4 fL (ref 80.0–100.0)
Monocytes Absolute: 0.8 K/uL (ref 0.1–1.0)
Monocytes Relative: 12 %
Neutro Abs: 3.8 K/uL (ref 1.7–7.7)
Neutrophils Relative %: 58 %
Platelets: 247 K/uL (ref 150–400)
RBC: 3.03 MIL/uL — ABNORMAL LOW (ref 3.87–5.11)
RDW: 13.5 % (ref 11.5–15.5)
WBC: 6.4 K/uL (ref 4.0–10.5)
nRBC: 0.3 % — ABNORMAL HIGH (ref 0.0–0.2)

## 2023-10-06 LAB — CBG MONITORING, ED
Glucose-Capillary: 563 mg/dL (ref 70–99)
Glucose-Capillary: 454 mg/dL — ABNORMAL HIGH (ref 70–99)
Glucose-Capillary: 404 mg/dL — ABNORMAL HIGH (ref 70–99)

## 2023-10-06 LAB — I-STAT VENOUS BLOOD GAS, ED
Acid-base deficit: 1 mmol/L (ref 0.0–2.0)
Bicarbonate: 24.4 mmol/L (ref 20.0–28.0)
Calcium, Ion: 1.17 mmol/L (ref 1.15–1.40)
HCT: 32 % — ABNORMAL LOW (ref 36.0–46.0)
Hemoglobin: 10.9 g/dL — ABNORMAL LOW (ref 12.0–15.0)
O2 Saturation: 98 %
Potassium: 4.4 mmol/L (ref 3.5–5.1)
Sodium: 132 mmol/L — ABNORMAL LOW (ref 135–145)
TCO2: 26 mmol/L (ref 22–32)
pCO2, Ven: 41.7 mmHg — ABNORMAL LOW (ref 44–60)
pH, Ven: 7.375 (ref 7.25–7.43)
pO2, Ven: 109 mmHg — ABNORMAL HIGH (ref 32–45)

## 2023-10-06 LAB — I-STAT CHEM 8, ED
BUN: 14 mg/dL (ref 8–23)
Calcium, Ion: 1.19 mmol/L (ref 1.15–1.40)
Chloride: 97 mmol/L — ABNORMAL LOW (ref 98–111)
Creatinine, Ser: 2 mg/dL — ABNORMAL HIGH (ref 0.44–1.00)
Glucose, Bld: 684 mg/dL (ref 70–99)
HCT: 33 % — ABNORMAL LOW (ref 36.0–46.0)
Hemoglobin: 11.2 g/dL — ABNORMAL LOW (ref 12.0–15.0)
Potassium: 4.5 mmol/L (ref 3.5–5.1)
Sodium: 132 mmol/L — ABNORMAL LOW (ref 135–145)
TCO2: 23 mmol/L (ref 22–32)

## 2023-10-06 LAB — RESP PANEL BY RT-PCR (RSV, FLU A&B, COVID)  RVPGX2
Influenza A by PCR: NEGATIVE
Influenza B by PCR: NEGATIVE
Resp Syncytial Virus by PCR: NEGATIVE
SARS Coronavirus 2 by RT PCR: NEGATIVE

## 2023-10-06 LAB — BETA-HYDROXYBUTYRIC ACID: Beta-Hydroxybutyric Acid: 0.16 mmol/L (ref 0.05–0.27)

## 2023-10-06 LAB — OSMOLALITY: Osmolality: 304 mosm/kg — ABNORMAL HIGH (ref 275–295)

## 2023-10-06 MED ORDER — ANASTROZOLE 1 MG PO TABS
1.0000 mg | ORAL_TABLET | Freq: Every day | ORAL | Status: DC
  Administered 2023-10-07: 1 mg via ORAL
  Filled 2023-10-06: qty 1

## 2023-10-06 MED ORDER — INSULIN GLARGINE 100 UNIT/ML ~~LOC~~ SOLN
25.0000 [IU] | Freq: Every day | SUBCUTANEOUS | Status: DC
  Filled 2023-10-06: qty 0.25

## 2023-10-06 MED ORDER — POTASSIUM CHLORIDE 10 MEQ/100ML IV SOLN
10.0000 meq | INTRAVENOUS | Status: DC
  Filled 2023-10-06: qty 100

## 2023-10-06 MED ORDER — SODIUM CHLORIDE 0.9 % IV SOLN
500.0000 mg | INTRAVENOUS | Status: DC
  Administered 2023-10-06: 500 mg via INTRAVENOUS
  Filled 2023-10-06: qty 5

## 2023-10-06 MED ORDER — SENNA 8.6 MG PO TABS: 1.0000 | ORAL_TABLET | Freq: Every day | ORAL | Status: DC | PRN

## 2023-10-06 MED ORDER — DEXTROSE IN LACTATED RINGERS 5 % IV SOLN: INTRAVENOUS | Status: DC

## 2023-10-06 MED ORDER — ALLOPURINOL 100 MG PO TABS
100.0000 mg | ORAL_TABLET | Freq: Two times a day (BID) | ORAL | Status: DC
Start: 2023-10-07 — End: 2023-10-07
  Administered 2023-10-07 (×2): 100 mg via ORAL
  Filled 2023-10-06 (×2): qty 1

## 2023-10-06 MED ORDER — PANTOPRAZOLE SODIUM 40 MG PO TBEC
40.0000 mg | DELAYED_RELEASE_TABLET | Freq: Every day | ORAL | Status: DC
  Administered 2023-10-07: 40 mg via ORAL
  Filled 2023-10-06: qty 1

## 2023-10-06 MED ORDER — HYDROXYZINE HCL 25 MG PO TABS
50.0000 mg | ORAL_TABLET | Freq: Every day | ORAL | Status: DC
  Administered 2023-10-07: 50 mg via ORAL
  Filled 2023-10-06: qty 2

## 2023-10-06 MED ORDER — HEPARIN SODIUM (PORCINE) 5000 UNIT/ML IJ SOLN
5000.0000 [IU] | Freq: Three times a day (TID) | INTRAMUSCULAR | Status: DC
  Administered 2023-10-07: 5000 [IU] via SUBCUTANEOUS
  Filled 2023-10-06: qty 1

## 2023-10-06 MED ORDER — GABAPENTIN 100 MG PO CAPS: 100.0000 mg | ORAL_CAPSULE | ORAL | Status: DC

## 2023-10-06 MED ORDER — ACETAMINOPHEN 650 MG RE SUPP: 650.0000 mg | Freq: Four times a day (QID) | RECTAL | Status: DC | PRN

## 2023-10-06 MED ORDER — INSULIN ASPART 100 UNIT/ML IJ SOLN
0.0000 [IU] | INTRAMUSCULAR | Status: DC
  Administered 2023-10-07: 7 [IU] via SUBCUTANEOUS

## 2023-10-06 MED ORDER — INSULIN REGULAR(HUMAN) IN NACL 100-0.9 UT/100ML-% IV SOLN
INTRAVENOUS | Status: DC
  Filled 2023-10-06: qty 100

## 2023-10-06 MED ORDER — SODIUM CHLORIDE 0.9 % IV SOLN: INTRAVENOUS | Status: DC

## 2023-10-06 MED ORDER — DULOXETINE HCL 30 MG PO CPEP
30.0000 mg | ORAL_CAPSULE | Freq: Every day | ORAL | Status: DC
  Administered 2023-10-07: 30 mg via ORAL
  Filled 2023-10-06: qty 1

## 2023-10-06 MED ORDER — ATORVASTATIN CALCIUM 10 MG PO TABS
20.0000 mg | ORAL_TABLET | Freq: Every day | ORAL | Status: DC
  Administered 2023-10-07: 20 mg via ORAL
  Filled 2023-10-06: qty 2

## 2023-10-06 MED ORDER — GABAPENTIN 100 MG PO CAPS
200.0000 mg | ORAL_CAPSULE | Freq: Every day | ORAL | Status: DC
  Administered 2023-10-07: 200 mg via ORAL
  Filled 2023-10-06: qty 2

## 2023-10-06 MED ORDER — LACTATED RINGERS IV SOLN: INTRAVENOUS | Status: DC

## 2023-10-06 MED ORDER — SODIUM CHLORIDE 0.9 % IV BOLUS
1000.0000 mL | Freq: Once | INTRAVENOUS | Status: AC
  Administered 2023-10-06: 1000 mL via INTRAVENOUS

## 2023-10-06 MED ORDER — GABAPENTIN 100 MG PO CAPS
100.0000 mg | ORAL_CAPSULE | Freq: Two times a day (BID) | ORAL | Status: DC
  Administered 2023-10-07: 100 mg via ORAL
  Filled 2023-10-06: qty 1

## 2023-10-06 MED ORDER — INSULIN ASPART 100 UNIT/ML IJ SOLN
10.0000 [IU] | Freq: Once | INTRAMUSCULAR | Status: AC
  Administered 2023-10-06: 10 [IU] via SUBCUTANEOUS

## 2023-10-06 MED ORDER — ACETAMINOPHEN 325 MG PO TABS
650.0000 mg | ORAL_TABLET | Freq: Four times a day (QID) | ORAL | Status: DC | PRN
  Administered 2023-10-07: 650 mg via ORAL
  Filled 2023-10-06: qty 2

## 2023-10-06 MED ORDER — SODIUM CHLORIDE 0.9 % IV SOLN
1.0000 g | INTRAVENOUS | Status: DC
  Administered 2023-10-06: 1 g via INTRAVENOUS
  Filled 2023-10-06: qty 10

## 2023-10-06 MED ORDER — DEXTROSE 50 % IV SOLN: 0.0000 mL | INTRAVENOUS | Status: DC | PRN

## 2023-10-06 NOTE — ED Notes (Signed)
 CCMD called.

## 2023-10-06 NOTE — ED Provider Notes (Signed)
 [facility] AT [facility] Provider Note   CSN: [id number] Arrival date & time: 10/06/23  1932     Patient presents with: Hyperglycemia   [name] is a 73 y.o. female.   73 year old female presents with increased blood sugar.  Old chart reviewed shows that she was just discharged in hospital 2 days ago for similar symptoms.  Patient's blood sugars at home have been running above 400.  She has had some increased cough and shortness of breath but denies any fever.  Patient does use oxygen  as needed at home.  States that she has been compliant with her diabetic diet.  Notes compliance with her medications as well 2.  No vomiting or diarrhea.  Called EMS and there is CBG read as high.  Transported here for further evaluation.  History obtained also from patient's sister who was on the phone       Prior to Admission medications   Medication Sig Start Date End Date Taking? Authorizing Provider  acetaminophen  (TYLENOL ) 650 MG CR tablet Take 1,300 mg by mouth daily as needed for pain.    [provider]  albuterol  (PROVENTIL ) (2.5 MG/3ML) 0.083% nebulizer solution Take 3 mLs (2.5 mg total) by nebulization every 6 (six) hours as needed for wheezing or shortness of breath. 08/02/21   Vivienne Delon HERO, PA-C  albuterol  (VENTOLIN  HFA) 108 (90 Base) MCG/ACT inhaler Inhale 1-2 puffs into the lungs every 6 (six) hours as needed for wheezing or shortness of breath. 09/14/21   Danton Jon HERO, PA-C  allopurinol  (ZYLOPRIM ) 100 MG tablet Take 100 mg by mouth 2 (two) times daily.    [provider]  anastrozole  (ARIMIDEX ) 1 MG tablet TAKE ONE TABLET BY MOUTH ONCE DAILY 06/18/22   Odean Potts, MD  atorvastatin  (LIPITOR) 20 MG tablet Take 20 mg by mouth daily.    [provider]  DULoxetine  (CYMBALTA ) 30 MG capsule Take 30 mg by mouth daily.    [provider]  feeding supplement, GLUCERNA SHAKE, (GLUCERNA SHAKE) LIQD Take 237  mLs by mouth 2 (two) times daily between meals. 06/02/23   Rosario Leatrice FERNS, MD  fluticasone  (FLONASE ) 50 MCG/ACT nasal spray Place 2 sprays into both nostrils daily. 05/10/21   Newlin, Enobong, MD  gabapentin  (NEURONTIN ) 100 MG capsule Take 100-200 mg by mouth See admin instructions. Take one tablet by mouth in the morning and afternoon, then take 2 tablets every night per patient 03/12/22   [provider]  hydrOXYzine  (ATARAX ) 25 MG tablet Take 50 mg by mouth at bedtime. 05/07/23   [provider]  insulin  glargine (LANTUS ) 100 UNIT/ML Solostar Pen Inject 25 Units into the skin daily. 10/04/23   Ghimire, Donalda HERO, MD  linagliptin  (TRADJENTA ) 5 MG TABS tablet Take 1 tablet (5 mg total) by mouth daily. 10/04/23   Ghimire, Donalda HERO, MD  methocarbamol  (ROBAXIN ) 500 MG tablet Take 1 tablet (500 mg total) by mouth 2 (two) times daily. Patient taking differently: Take 500 mg by mouth every 8 (eight) hours as needed for muscle spasms. 03/09/23   Prosperi, Christian H, PA-C  nitroGLYCERIN  (NITROSTAT ) 0.4 MG SL tablet Place 1 tablet (0.4 mg total) under the tongue every 5 (five) minutes as needed for chest pain. 03/15/20   Fleming, Zelda W, NP  oxyCODONE -acetaminophen  (PERCOCET) 5-325 MG tablet Take 1 tablet by mouth every 4 (four) hours as needed for severe pain (pain score 7-10). 07/18/23 07/17/24  Danford, Lonni SQUIBB, MD  pantoprazole  (PROTONIX ) 40  MG tablet TAKE ONE TABLET BY MOUTH EVERY DAY FOR ACID REFLUX Patient taking differently: Take 40 mg by mouth daily. 11/02/21   Fleming, Zelda W, NP  polyethylene glycol (MIRALAX  / GLYCOLAX ) 17 g packet Take 17 g by mouth daily. 06/03/23   Rosario Leatrice FERNS, MD  potassium chloride  SA (KLOR-CON  M) 20 MEQ tablet Take 1 tablet (20 mEq total) by mouth daily for 5 days. 09/28/23 10/03/23  Lue Elsie BROCKS, MD  senna (SENOKOT) 8.6 MG TABS tablet Take 1 tablet (8.6 mg total) by mouth daily as needed for mild constipation or moderate constipation. 06/02/23    Rosario Leatrice FERNS, MD    Allergies: Patient has no known allergies.    Review of Systems  All other systems reviewed and are negative.   Updated Vital Signs Pulse 96   Temp 98.5 F (36.9 C) (Oral)   Resp 17   Ht 1.499 m (4' 11)   Wt 58.3 kg   LMP  (LMP Unknown) Comment: tubal ligation  SpO2 100%   BMI 25.96 kg/m   Physical Exam Vitals and nursing note reviewed.  Constitutional:      General: She is not in acute distress.    Appearance: Normal appearance. She is well-developed. She is not toxic-appearing.  HENT:     Head: Normocephalic and atraumatic.  Eyes:     General: Lids are normal.     Conjunctiva/sclera: Conjunctivae normal.     Pupils: Pupils are equal, round, and reactive to light.  Neck:     Thyroid : No thyroid  mass.     Trachea: No tracheal deviation.  Cardiovascular:     Rate and Rhythm: Normal rate and regular rhythm.     Heart sounds: Normal heart sounds. No murmur heard.    No gallop.  Pulmonary:     Effort: Pulmonary effort is normal. No respiratory distress.     Breath sounds: No stridor. Decreased breath sounds present. No wheezing, rhonchi or rales.  Abdominal:     General: There is no distension.     Palpations: Abdomen is soft.     Tenderness: There is no abdominal tenderness. There is no rebound.  Musculoskeletal:        General: No tenderness. Normal range of motion.     Cervical back: Normal range of motion and neck supple.  Skin:    General: Skin is warm and dry.     Findings: No abrasion or rash.  Neurological:     General: No focal deficit present.     Mental Status: She is alert and oriented to person, place, and time. Mental status is at baseline.     GCS: GCS eye subscore is 4. GCS verbal subscore is 5. GCS motor subscore is 6.     Cranial Nerves: No cranial nerve deficit.     Sensory: No sensory deficit.     Motor: Motor function is intact.  Psychiatric:        Attention and Perception: Attention normal.        Mood and  Affect: Affect is blunt.        Speech: Speech is delayed.     (all labs ordered are listed, but only abnormal results are displayed) Labs Reviewed  CBG MONITORING, ED - Abnormal; Notable for the following components:      Result Value   Glucose-Capillary 563 (*)    All other components within normal limits  RESP PANEL BY RT-PCR (RSV, FLU A&B, COVID)  RVPGX2  CBC  URINALYSIS, ROUTINE W REFLEX MICROSCOPIC  COMPREHENSIVE METABOLIC PANEL WITH GFR  BETA-HYDROXYBUTYRIC ACID  CBG MONITORING, ED  I-STAT CHEM 8, ED    EKG: None  Radiology: No results found.   Procedures   Medications Ordered in the ED  sodium chloride  0.9 % bolus 1,000 mL (has no administration in time range)  0.9 %  sodium chloride  infusion (has no administration in time range)                                    Medical Decision Making Amount and/or Complexity of Data Reviewed Labs: ordered. Radiology: ordered.  Risk Prescription drug management.   Patient is urinalysis consistent with infection.  Electrolytes consistent with hyperglycemia.  Patient's anion gap is normal.  Patient has had some increased cough congestion.  Chest x-ray shows possible pneumonia.  Will start on Rocephin  and Zithromax .  Chronic kidney disease noted as well 2.  Review of the record shows that patient has had UTIs before in the past but does not appear that urine culture has been done.  Patient will require hospitalization.  Will contact patient's sister and daughter     Final diagnoses:  None    ED Discharge Orders     None          Dasie Faden, MD 10/06/23 2110

## 2023-10-06 NOTE — ED Notes (Signed)
 Spoke to [facility] regarding endotool orders. Amion gap and CO2 are WNL, glucose trending down after 500ml NS. Also sent secure chat to Dr.Kakrakandy regarding same

## 2023-10-06 NOTE — H&P (Signed)
 History and Physical    [name] FMW:[id number] DOB: 04/18/1950 DOA: 10/06/2023  Patient coming from: Home.  Chief Complaint: Elevated blood sugar.  HPI: [name] is a 73 y.o. female with history of diabetes mellitus type 2 who was recently admitted twice in the last 10 days for hyperosmolar status with hemoglobin A1c of more than 15.5 with doubtful compliance with medication was brought to the ER after patient's blood sugar was found to be elevated.  Patient also states she has been having some cough for last few days.  Denies any chest pain nausea vomiting diarrhea.  Discussed with patient's granddaughter [name] who confirms that she did get her Lantus  25 units insulin  at home before coming to the ER.  Patient's granddaughter states that patient drinks a lot of juice.  Patient also was noticed to have increased productive cough before coming to the ER.  ED Course: In the ER patient is afebrile.  Labs show blood glucose of 658 creatinine of 2.2 anion gap of 12 with VBG showing pH of 7.37.  Patient was initially planned to start on Endo tool but after IV fluid bolus patient blood sugar improved to 400 so gave subcu NovoLog  insulin  10 units and admitted for further management.  Chest x-ray showing possible pneumonia and was started on empiric antibiotics.  COVID and flu test were negative.  Review of Systems: As per HPI, rest all negative.   Past Medical History:  Diagnosis Date   Ambulates with cane    straight   Angina    no current problems per patient at PAT appt 11/05/18, nitroglycerin  sl 06/30/20   Arthritis    HANDS   Asthma    Breast cancer (HCC)    Cancer (HCC) 09/04/2017   Right Breast   Carpal tunnel syndrome, bilateral 11/26/2016   Cholelithiasis    Cocaine abuse (HCC) 07/2012   per E.R. drug screen   COPD (chronic obstructive pulmonary disease) (HCC)    per patient   Coronary artery disease    Diabetes mellitus without mention of complication     type 2   Dysphagia    esophageal dysmotility on 03/2011 esophagram    Fatty liver disease, nonalcoholic 2009   on Imaging.    GERD (gastroesophageal reflux disease)    Headache    Hearing loss    bilateral - no hearing aids   History of colonic polyps 2009   adenomatous 2009, HP in 2009, 2011, 2013.    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   Wears dentures    full upper and partial lower   Wears glasses     Past Surgical History:  Procedure Laterality Date   ABDOMINAL ANGIOGRAM  02/20/2011   Procedure: ABDOMINAL ANGIOGRAM;  Surgeon: Rober LOISE Chroman, MD;  Location: [facility] CATH LAB;  Service: Cardiovascular;;   BREAST BIOPSY Left    CARPAL TUNNEL RELEASE Right 07/04/2017   Procedure: RIGHT CARPAL TUNNEL RELEASE;  Surgeon: Murrell Drivers, MD;  Location: [facility];  Service: Orthopedics;  Laterality: Right;   CARPAL TUNNEL RELEASE Left 09/12/2017   Procedure: LEFT CARPAL TUNNEL RELEASE;  Surgeon: Murrell Drivers, MD;  Location: [facility];  Service: Orthopedics;  Laterality: Left;   CATARACT EXTRACTION     CHOLECYSTECTOMY N/A 11/11/2018  Procedure: ATTEMPTED LAPAROSCOPIC CHOLECUSTECOMY,  OPEN CHOLECYSTECTOMY;  Surgeon: Vernetta Berg, MD;  Location: [facility] OR;  Service: General;  Laterality: N/A;   COLONOSCOPY  11/30/2011   Procedure: COLONOSCOPY;  Surgeon: Princella CHRISTELLA Nida, MD;  Location: [facility] ENDOSCOPY;  Service: Endoscopy;  Laterality: N/A;   CORONARY ANGIOPLASTY WITH STENT PLACEMENT  02/20/2011   ERCP N/A 10/03/2018   Procedure: ENDOSCOPIC RETROGRADE CHOLANGIOPANCREATOGRAPHY (ERCP);  Surgeon: Aneita Gwendlyn DASEN, MD;  Location: [facility];  Service: Endoscopy;  Laterality: N/A;   ESOPHAGOGASTRODUODENOSCOPY  11/30/2011   Procedure: ESOPHAGOGASTRODUODENOSCOPY (EGD);  Surgeon: Princella CHRISTELLA Nida, MD;  Location: [facility];  Service: Endoscopy;  Laterality: N/A;   ESOPHAGOGASTRODUODENOSCOPY N/A 02/23/2015   Procedure: ESOPHAGOGASTRODUODENOSCOPY (EGD);  Surgeon: Lupita FORBES Commander, MD;  Location: [facility] ENDOSCOPY;  Service: Endoscopy;  Laterality: N/A;   EYE SURGERY Bilateral    cataracts removed   INTRAOPERATIVE CHOLANGIOGRAM N/A 11/11/2018   Procedure: Intraoperative Cholangiogram;  Surgeon: Vernetta Berg, MD;  Location: [facility] OR;  Service: General;  Laterality: N/A;   IR REMOVAL TUN ACCESS W/ PORT W/O FL MOD SED  06/30/2020   LEFT HEART CATHETERIZATION WITH CORONARY ANGIOGRAM N/A 02/20/2011   Procedure: LEFT HEART CATHETERIZATION WITH CORONARY ANGIOGRAM;  Surgeon: Rober LOISE Chroman, MD;  Location: [facility] CATH LAB;  Service: Cardiovascular;  Laterality: N/A;   MASTECTOMY Right 11/05/2017   MASTECTOMY W/ SENTINEL NODE BIOPSY Right 11/27/2017   MASTECTOMY W/ SENTINEL NODE BIOPSY Right 11/27/2017   Procedure: RIGHT TOTAL MASTECTOMY WITH SENTINEL LYMPH NODE BIOPSY;  Surgeon: Mikell Katz, MD;  Location: [facility] OR;  Service: General;  Laterality: Right;   PORT A CATH REVISION Left 05/07/2018   Procedure: PORT A CATH REVISION;  Surgeon: Mikell Katz, MD;  Location: [facility] ORS;  Service: General;  Laterality: Left;   PORTACATH PLACEMENT Left 11/27/2017   Procedure: INSERTION PORT-A-CATH;  Surgeon: Mikell Katz, MD;  Location: [facility] OR;  Service: General;  Laterality: Left;   REVERSE SHOULDER ARTHROPLASTY Left 03/09/2021   Procedure: REVERSE SHOULDER ARTHROPLASTY;  Surgeon: Melita Drivers, MD;  Location: [facility];  Service: Orthopedics;  Laterality: Left;   RIGHT COLECTOMY  02/2007   for post polypectomy colonic perforation   SPHINCTEROTOMY  10/03/2018   Procedure: SPHINCTEROTOMY;  Surgeon: Aneita Gwendlyn DASEN, MD;  Location: [facility];  Service: Endoscopy;;   TUBAL LIGATION       reports that she quit smoking about 13 years ago. Her smoking use included cigarettes. She started smoking about 60 years ago. She has  a 11.8 pack-year smoking history. She has never used smokeless tobacco. She reports that she does not currently use alcohol. She reports that she does not currently use drugs after having used the following drugs: Cocaine.  No Known Allergies  Family History  Problem Relation Age of Onset   Heart disease Mother    Diabetes Mother    Cancer Father        unsure what kind   Diabetes Sister    Colon cancer Neg Hx    Breast cancer Neg Hx     Prior to Admission medications   Medication Sig Start Date End Date Taking? Authorizing Provider  acetaminophen  (TYLENOL ) 650 MG CR tablet Take 1,300 mg by mouth daily as needed for pain.   Yes [provider]  albuterol  (PROVENTIL ) (2.5 MG/3ML) 0.083% nebulizer solution Take 3 mLs (2.5 mg total) by nebulization every 6 (six) hours as needed for wheezing or shortness of breath. 08/02/21  Yes Burnette, Delon CHRISTELLA, PA-C  albuterol  (VENTOLIN  HFA) 108 (90  Base) MCG/ACT inhaler Inhale 1-2 puffs into the lungs every 6 (six) hours as needed for wheezing or shortness of breath. 09/14/21  Yes Danton Jon HERO, PA-C  allopurinol  (ZYLOPRIM ) 100 MG tablet Take 100 mg by mouth 2 (two) times daily.   Yes [provider]  anastrozole  (ARIMIDEX ) 1 MG tablet TAKE ONE TABLET BY MOUTH ONCE DAILY 06/18/22  Yes Gudena, Vinay, MD  atorvastatin  (LIPITOR) 20 MG tablet Take 20 mg by mouth daily.   Yes [provider]  DULoxetine  (CYMBALTA ) 30 MG capsule Take 30 mg by mouth daily.   Yes [provider]  feeding supplement, GLUCERNA SHAKE, (GLUCERNA SHAKE) LIQD Take 237 mLs by mouth 2 (two) times daily between meals. 06/02/23  Yes Rosario Leatrice FERNS, MD  fluticasone  (FLONASE ) 50 MCG/ACT nasal spray Place 2 sprays into both nostrils daily. 05/10/21  Yes Newlin, Enobong, MD  gabapentin  (NEURONTIN ) 100 MG capsule Take 100-200 mg by mouth See admin instructions. Take one tablet by mouth in the morning and afternoon, then take 2 tablets every night per  patient 03/12/22  Yes [provider]  hydrOXYzine  (ATARAX ) 25 MG tablet Take 50 mg by mouth at bedtime. 05/07/23  Yes [provider]  insulin  glargine (LANTUS ) 100 UNIT/ML Solostar Pen Inject 25 Units into the skin daily. 10/04/23  Yes Ghimire, Donalda HERO, MD  linagliptin  (TRADJENTA ) 5 MG TABS tablet Take 1 tablet (5 mg total) by mouth daily. 10/04/23  Yes Ghimire, Donalda HERO, MD  methocarbamol  (ROBAXIN ) 500 MG tablet Take 1 tablet (500 mg total) by mouth 2 (two) times daily. Patient taking differently: Take 500 mg by mouth every 8 (eight) hours as needed for muscle spasms. 03/09/23  Yes Prosperi, Christian H, PA-C  nitroGLYCERIN  (NITROSTAT ) 0.4 MG SL tablet Place 1 tablet (0.4 mg total) under the tongue every 5 (five) minutes as needed for chest pain. 03/15/20  Yes Fleming, Zelda W, NP  pantoprazole  (PROTONIX ) 40 MG tablet TAKE ONE TABLET BY MOUTH EVERY DAY FOR ACID REFLUX Patient taking differently: Take 40 mg by mouth daily. 11/02/21  Yes Fleming, Zelda W, NP  polyethylene glycol (MIRALAX  / GLYCOLAX ) 17 g packet Take 17 g by mouth daily. 06/03/23  Yes Rosario Leatrice FERNS, MD  senna (SENOKOT) 8.6 MG TABS tablet Take 1 tablet (8.6 mg total) by mouth daily as needed for mild constipation or moderate constipation. 06/02/23  Yes Rosario Leatrice FERNS, MD    Physical Exam: Constitutional: Moderately built and nourished. Vitals:   10/06/23 1939 10/06/23 1940 10/06/23 1941 10/06/23 1945  BP:    103/72  Pulse:  96  (!) 101  Resp:  17  19  Temp:  98.5 F (36.9 C)    TempSrc:  Oral    SpO2: 100% 100%  100%  Weight:   58.3 kg   Height:   4' 11 (1.499 m)    Eyes: Anicteric no pallor. ENMT: No discharge from the ears eyes nose or mouth. Neck: No mass felt.  No neck rigidity. Respiratory: No rhonchi or crepitations. Cardiovascular: S1-S2 heard. Abdomen: Soft nontender bowel sound present. Musculoskeletal: No edema. Skin: No rash. Neurologic: Alert awake oriented to place and person moving  all extremities. Psychiatric: Appears normal.  Normal affect.   Labs on Admission: I have personally reviewed following labs and imaging studies  CBC: Recent Labs  Lab 10/02/23 0438 10/02/23 0448 10/02/23 0927 10/02/23 1727 10/03/23 0050 10/04/23 0541 10/06/23 1942 10/06/23 1957 10/06/23 2155  WBC 4.5  --  5.1 4.1 5.9 6.0 6.3  --  6.4  NEUTROABS 2.2  --  1.9  --   --   --   --   --  3.8  HGB 10.7*   < > 11.0* 10.9* 10.2* 10.4* 9.9* 10.9*  11.2* 8.8*  HCT 32.0*   < > 33.1* 32.7* 30.5* 32.6* 30.8* 32.0*  33.0* 27.7*  MCV 85.1  --  85.3 85.8 85.0 88.1 91.4  --  91.4  PLT 250  --  258 270 282 276 272  --  247   < > = values in this interval not displayed.   Basic Metabolic Panel: Recent Labs  Lab 10/02/23 0927 10/02/23 1727 10/02/23 2218 10/03/23 0050 10/04/23 0541 10/06/23 1942 10/06/23 1957 10/06/23 2155  NA 137 133* 134* 138  --  130* 132*  132* 132*  K 3.6 3.7 4.1 4.0  --  4.4 4.4  4.5 4.2  CL 101 100 102 104  --  96* 97* 102  CO2 26 24 24 26   --  22  --  22  GLUCOSE 185* 378* 393* 224*  --  658* 684* 482*  BUN 14 14 14 14   --  12 14 11   CREATININE 1.77* 1.80* 2.07* 2.00*  --  2.23* 2.00* 2.04*  CALCIUM  9.9 9.1 9.1 9.5  --  9.3  --  8.9  MG 1.5*  --   --  1.3* 2.4  --   --   --   PHOS 2.8  --   --   --   --   --   --   --    GFR: Estimated Creatinine Clearance: 18.5 mL/min (A) (by C-G formula based on SCr of 2.04 mg/dL (H)). Liver Function Tests: Recent Labs  Lab 10/02/23 0438 10/06/23 1942  AST 23 26  ALT 28 18  ALKPHOS 175* 125  BILITOT 0.9 0.7  PROT 6.8 7.0  ALBUMIN  3.6 3.6   No results for input(s): LIPASE, AMYLASE in the last 168 hours. No results for input(s): AMMONIA in the last 168 hours. Coagulation Profile: No results for input(s): INR, PROTIME in the last 168 hours. Cardiac Enzymes: Recent Labs  Lab 10/02/23 0646  CKTOTAL 58   BNP (last 3 results) No results for input(s): PROBNP in the last 8760 hours. HbA1C: No  results for input(s): HGBA1C in the last 72 hours. CBG: Recent Labs  Lab 10/03/23 2108 10/04/23 0754 10/06/23 1941 10/06/23 2118 10/06/23 2210  GLUCAP 156* 150* 563* 454* 404*   Lipid Profile: No results for input(s): CHOL, HDL, LDLCALC, TRIG, CHOLHDL, LDLDIRECT in the last 72 hours. Thyroid  Function Tests: No results for input(s): TSH, T4TOTAL, FREET4, T3FREE, THYROIDAB in the last 72 hours. Anemia Panel: No results for input(s): VITAMINB12, FOLATE, FERRITIN, TIBC, IRON , RETICCTPCT in the last 72 hours. Urine analysis:    Component Value Date/Time   COLORURINE STRAW (A) 10/06/2023 2014   APPEARANCEUR CLEAR 10/06/2023 2014   LABSPEC 1.014 10/06/2023 2014   PHURINE 6.0 10/06/2023 2014   GLUCOSEU >=500 (A) 10/06/2023 2014   HGBUR NEGATIVE 10/06/2023 2014   BILIRUBINUR NEGATIVE 10/06/2023 2014   KETONESUR NEGATIVE 10/06/2023 2014   PROTEINUR NEGATIVE 10/06/2023 2014   UROBILINOGEN 0.2 03/31/2014 2017   NITRITE NEGATIVE 10/06/2023 2014   LEUKOCYTESUR SMALL (A) 10/06/2023 2014   Sepsis Labs: @LABRCNTIP (procalcitonin:4,lacticidven:4) ) Recent Results (from the past 240 hours)  Resp panel by RT-PCR (RSV, Flu A&B, Covid) Anterior Nasal Swab     Status: None   Collection Time: 10/06/23  8:05 PM   Specimen: Anterior  Nasal Swab  Result Value Ref Range Status   SARS Coronavirus 2 by RT PCR NEGATIVE NEGATIVE Final   Influenza A by PCR NEGATIVE NEGATIVE Final   Influenza B by PCR NEGATIVE NEGATIVE Final    Comment: (NOTE) The Xpert Xpress SARS-CoV-2/FLU/RSV plus assay is intended as an aid in the diagnosis of influenza from Nasopharyngeal swab specimens and should not be used as a sole basis for treatment. Nasal washings and aspirates are unacceptable for Xpert Xpress SARS-CoV-2/FLU/RSV testing.  Fact Sheet for Patients: BloggerCourse.com  Fact Sheet for Healthcare  Providers: SeriousBroker.it  This test is not yet approved or cleared by the [place]  FDA and has been authorized for detection and/or diagnosis of SARS-CoV-2 by FDA under an Emergency Use Authorization (EUA). This EUA will remain in effect (meaning this test can be used) for the duration of the COVID-19 declaration under Section 564(b)(1) of the Act, 21 U.S.C. section 360bbb-3(b)(1), unless the authorization is terminated or revoked.     Resp Syncytial Virus by PCR NEGATIVE NEGATIVE Final    Comment: (NOTE) Fact Sheet for Patients: BloggerCourse.com  Fact Sheet for Healthcare Providers: SeriousBroker.it  This test is not yet approved or cleared by the [place]  FDA and has been authorized for detection and/or diagnosis of SARS-CoV-2 by FDA under an Emergency Use Authorization (EUA). This EUA will remain in effect (meaning this test can be used) for the duration of the COVID-19 declaration under Section 564(b)(1) of the Act, 21 U.S.C. section 360bbb-3(b)(1), unless the authorization is terminated or revoked.  Performed at [facility], [address], [place], [place] [address]      Radiological Exams on Admission: DG Chest Port 1 View Result Date: 10/06/2023 CLINICAL DATA:  Shortness of breath.  Hyperglycemia. EXAM: PORTABLE CHEST 1 VIEW COMPARISON:  Four days ago 10/02/2023 FINDINGS: Low lung volumes persist. Increasing hazy opacity at the left lung base. Stable heart size and mediastinal contours. Aortic atherosclerosis. There may be a small left pleural effusion. No pneumothorax. No pulmonary edema. IMPRESSION: Increasing hazy opacity at the left lung base, atelectasis versus pneumonia. Possible small left pleural effusion. Electronically Signed   By: Andrea Gasman M.D.   On: 10/06/2023 20:23    EKG: Independently reviewed.  Normal sinus rhythm.  Assessment/Plan Principal  Problem:   Hyperosmolar hyperglycemic state (HHS) (HCC) Active Problems:   Malignant neoplasm of upper-outer quadrant of right breast in female, estrogen receptor positive (HCC)   CKD (chronic kidney disease) stage 4, GFR 15-29 ml/min (HCC)   Gout   CAP (community acquired pneumonia)    Hyperosmolar hyperglycemic status recent hemoglobin A1c done 5 days ago was morning 15.5.  Patient's granddaughter confirms she got her Lantus  insulin  at home before coming.  But also states that patient has been drinking a lot of juice.  Likely contributing to her uncontrolled status.  Patient's blood sugar improved with fluids at this time I ordered NovoLog  10 units subcu and overnight CBG checks every 4 hourly and in the morning I am going to place patient on Premeal scheduled insulin  of 6 units along with Lantus  25 at bedtime.  Closely monitor. Possible pneumonia on empiric antibiotics. Chronic disease stage III with mild worsening of creatinine improving with fluids.  Closely monitor. CAD denies any chest pain.  Continue statins. Anemia likely from renal disease follow CBC. History of gout on allopurinol . History of breast cancer on anastrozole . Depression on Cymbalta . Neuropathy on gabapentin .  Dose may need to be adjusted if creatinine worsens. GERD  on PPI. Hyperlipidemia on statins.  Since patient has poor osmolar status with possible pneumonia will need close monitoring further workup and more than 2 midnight stay.   DVT prophylaxis: Heparin . Code Status: Full code. Family Communication: Patient's granddaughter. Disposition Plan: Monitored bed. Consults called: None. Admission status: Observation.

## 2023-10-06 NOTE — ED Triage Notes (Signed)
 Pt BIB [facility]. Pt reported CBG has been reading high all. Pt has NO c/o weakness, chest pain, dizziness  Similar event 8/27  [facility] VS: 170/80 92bpm 18 RR 95% RA CBG HIGH  Patient A&Ox4 at arrival, but complains of SOB

## 2023-10-07 ENCOUNTER — Encounter (HOSPITAL_COMMUNITY): Payer: Self-pay | Admitting: Internal Medicine

## 2023-10-07 DIAGNOSIS — Z794 Long term (current) use of insulin: Secondary | ICD-10-CM

## 2023-10-07 DIAGNOSIS — N184 Chronic kidney disease, stage 4 (severe): Secondary | ICD-10-CM

## 2023-10-07 DIAGNOSIS — I10 Essential (primary) hypertension: Secondary | ICD-10-CM

## 2023-10-07 DIAGNOSIS — E119 Type 2 diabetes mellitus without complications: Secondary | ICD-10-CM | POA: Diagnosis not present

## 2023-10-07 DIAGNOSIS — I2510 Atherosclerotic heart disease of native coronary artery without angina pectoris: Secondary | ICD-10-CM | POA: Diagnosis not present

## 2023-10-07 DIAGNOSIS — E1100 Type 2 diabetes mellitus with hyperosmolarity without nonketotic hyperglycemic-hyperosmolar coma (NKHHC): Secondary | ICD-10-CM | POA: Diagnosis not present

## 2023-10-07 DIAGNOSIS — K219 Gastro-esophageal reflux disease without esophagitis: Secondary | ICD-10-CM

## 2023-10-07 LAB — BASIC METABOLIC PANEL WITH GFR
Anion gap: 12 (ref 5–15)
BUN: 11 mg/dL (ref 8–23)
CO2: 24 mmol/L (ref 22–32)
Calcium: 9.4 mg/dL (ref 8.9–10.3)
Chloride: 104 mmol/L (ref 98–111)
Creatinine, Ser: 1.8 mg/dL — ABNORMAL HIGH (ref 0.44–1.00)
GFR, Estimated: 29 mL/min — ABNORMAL LOW (ref >60)
Glucose, Bld: 192 mg/dL — ABNORMAL HIGH (ref 70–99)
Potassium: 4.6 mmol/L (ref 3.5–5.1)
Sodium: 140 mmol/L (ref 135–145)

## 2023-10-07 LAB — GLUCOSE, CAPILLARY
Glucose-Capillary: 201 mg/dL — ABNORMAL HIGH (ref 70–99)
Glucose-Capillary: 164 mg/dL — ABNORMAL HIGH (ref 70–99)

## 2023-10-07 LAB — CBC WITH DIFFERENTIAL/PLATELET
Abs Immature Granulocytes: 0.12 K/uL — ABNORMAL HIGH (ref 0.00–0.07)
Basophils Absolute: 0 K/uL (ref 0.0–0.1)
Basophils Relative: 1 %
Eosinophils Absolute: 0.2 K/uL (ref 0.0–0.5)
Eosinophils Relative: 3 %
HCT: 29.1 % — ABNORMAL LOW (ref 36.0–46.0)
Hemoglobin: 9 g/dL — ABNORMAL LOW (ref 12.0–15.0)
Immature Granulocytes: 2 %
Lymphocytes Relative: 30 %
Lymphs Abs: 1.7 K/uL (ref 0.7–4.0)
MCH: 28.2 pg (ref 26.0–34.0)
MCHC: 30.9 g/dL (ref 30.0–36.0)
MCV: 91.2 fL (ref 80.0–100.0)
Monocytes Absolute: 0.8 K/uL (ref 0.1–1.0)
Monocytes Relative: 14 %
Neutro Abs: 2.9 K/uL (ref 1.7–7.7)
Neutrophils Relative %: 50 %
Platelets: 286 K/uL (ref 150–400)
RBC: 3.19 MIL/uL — ABNORMAL LOW (ref 3.87–5.11)
RDW: 13.5 % (ref 11.5–15.5)
WBC: 5.6 K/uL (ref 4.0–10.5)
nRBC: 0 % (ref 0.0–0.2)

## 2023-10-07 LAB — CBC
HCT: 28.8 % — ABNORMAL LOW (ref 36.0–46.0)
Hemoglobin: 8.9 g/dL — ABNORMAL LOW (ref 12.0–15.0)
MCH: 28.4 pg (ref 26.0–34.0)
MCHC: 30.9 g/dL (ref 30.0–36.0)
MCV: 92 fL (ref 80.0–100.0)
Platelets: 262 K/uL (ref 150–400)
RBC: 3.13 MIL/uL — ABNORMAL LOW (ref 3.87–5.11)
RDW: 13.6 % (ref 11.5–15.5)
WBC: 5.9 K/uL (ref 4.0–10.5)
nRBC: 0.3 % — ABNORMAL HIGH (ref 0.0–0.2)

## 2023-10-07 LAB — HEPATIC FUNCTION PANEL
ALT: 18 U/L (ref 0–44)
AST: 25 U/L (ref 15–41)
Albumin: 3.1 g/dL — ABNORMAL LOW (ref 3.5–5.0)
Alkaline Phosphatase: 102 U/L (ref 38–126)
Bilirubin, Direct: 0.1 mg/dL (ref 0.0–0.2)
Indirect Bilirubin: 0.3 mg/dL (ref 0.3–0.9)
Total Bilirubin: 0.4 mg/dL (ref 0.0–1.2)
Total Protein: 6.4 g/dL — ABNORMAL LOW (ref 6.5–8.1)

## 2023-10-07 LAB — CBG MONITORING, ED
Glucose-Capillary: 261 mg/dL — ABNORMAL HIGH (ref 70–99)
Glucose-Capillary: 250 mg/dL — ABNORMAL HIGH (ref 70–99)
Glucose-Capillary: 47 mg/dL — ABNORMAL LOW (ref 70–99)
Glucose-Capillary: 168 mg/dL — ABNORMAL HIGH (ref 70–99)

## 2023-10-07 LAB — PROCALCITONIN: Procalcitonin: <0.1 ng/mL

## 2023-10-07 LAB — CREATININE, SERUM
Creatinine, Ser: 2 mg/dL — ABNORMAL HIGH (ref 0.44–1.00)
GFR, Estimated: 26 mL/min — ABNORMAL LOW (ref >60)

## 2023-10-07 LAB — MRSA NEXT GEN BY PCR, NASAL: MRSA by PCR Next Gen: NOT DETECTED

## 2023-10-07 MED ORDER — INSULIN ASPART 100 UNIT/ML IJ SOLN
6.0000 [IU] | Freq: Three times a day (TID) | INTRAMUSCULAR | Status: DC
  Administered 2023-10-07: 6 [IU] via SUBCUTANEOUS

## 2023-10-07 MED ORDER — ALBUTEROL SULFATE (2.5 MG/3ML) 0.083% IN NEBU: 3.0000 mL | INHALATION_SOLUTION | Freq: Four times a day (QID) | RESPIRATORY_TRACT | Status: DC | PRN

## 2023-10-07 MED ORDER — INSULIN GLARGINE 100 UNIT/ML SOLOSTAR PEN
25.0000 [IU] | PEN_INJECTOR | Freq: Every day | SUBCUTANEOUS | Status: AC
Start: 2023-10-07 — End: ?

## 2023-10-07 MED ORDER — AZITHROMYCIN 500 MG PO TABS: 500.0000 mg | ORAL_TABLET | Freq: Every day | ORAL | Status: DC

## 2023-10-07 MED ORDER — INSULIN ASPART 100 UNIT/ML IJ SOLN
0.0000 [IU] | Freq: Three times a day (TID) | INTRAMUSCULAR | Status: DC
  Administered 2023-10-07: 3 [IU] via SUBCUTANEOUS

## 2023-10-07 MED ORDER — INSULIN GLARGINE 100 UNIT/ML ~~LOC~~ SOLN
12.0000 [IU] | Freq: Two times a day (BID) | SUBCUTANEOUS | Status: DC
  Filled 2023-10-07: qty 0.12

## 2023-10-07 NOTE — ED Notes (Signed)
 Assisted pt onto bedpan for urine occurrence. Pt BG WNL and breakfast tray at bedside.

## 2023-10-07 NOTE — Progress Notes (Signed)
 PROGRESS NOTE    [name]  FMW:[id number] DOB: 1950/12/27 DOA: 10/06/2023 PCP: [facility]  Subjective: Pt seen and examined. Procalcitonin is negative. Pt does NOT have CAP. Abx stopped. CBG controlled with SQ lantus  and diet.   Hospital Course: CC: hyperglycemia HPI: [name] is a 73 y.o. female with history of diabetes mellitus type 2 who was recently admitted twice in the last 10 days for hyperosmolar status with hemoglobin A1c of more than 15.5 with doubtful compliance with medication was brought to the ER after patient's blood sugar was found to be elevated.  Patient also states she has been having some cough for last few days.  Denies any chest pain nausea vomiting diarrhea.   Discussed with patient's granddaughter Ms. Wisnewski who confirms that she did get her Lantus  25 units insulin  at home before coming to the ER.  Patient's granddaughter states that patient drinks a lot of juice.  Patient also was noticed to have increased productive cough before coming to the ER.   ED Course: In the ER patient is afebrile.  Labs show blood glucose of 658 creatinine of 2.2 anion gap of 12 with VBG showing pH of 7.37.  Patient was initially planned to start on Endo tool but after IV fluid bolus patient blood sugar improved to 400 so gave subcu NovoLog  insulin  10 units and admitted for further management.  Chest x-ray showing possible pneumonia and was started on empiric antibiotics.  COVID and flu test were negative  Significant Events: Admitted 10/06/2023 hyperosmolar hyperglycemia   Admission Labs: VBG pH 7.37, PCO2 of 41 WBC 6.3, HgB 9.9, plt 272 Na 130, K 4.4, CO2 of 22, BUN 12, Scr 2.23, glu 658 T protein 7.0, alb 3.6, AST 26, ALT 18, alk phos 125, T. Bili 0.7 Covid/rsv/flu negative Beta hydroxybutyric acid 0.16 Serum Osm 304  Admission Imaging Studies: CXR Increasing hazy opacity at the left lung base, atelectasis versus pneumonia. Possible  small left pleural effusion  Significant Labs:   Significant Imaging Studies:   Antibiotic Therapy: Anti-infectives (From admission, onward)    Start     Dose/Rate Route Frequency Ordered Stop   10/06/23 2115  cefTRIAXone  (ROCEPHIN ) 1 g in sodium chloride  0.9 % 100 mL IVPB        1 g 200 mL/hr over 30 Minutes Intravenous Every 24 hours 10/06/23 2109     10/06/23 2115  azithromycin  (ZITHROMAX ) 500 mg in sodium chloride  0.9 % 250 mL IVPB        500 mg 250 mL/hr over 60 Minutes Intravenous Every 24 hours 10/06/23 2111         Procedures:   Consultants:     Assessment and Plan: * Hyperosmolar hyperglycemic state (HHS) (HCC) 10-07-2023 fortunately, pt did not require IV insulin  gtts. CBG controlled with SQ insulin . Pt states she drinks grape juice at home. In review of pt's recent admission. CBG controlled with lantus , SSI and prescribed diabetic diet. Discussed with pt's sister [name], who lives in [place] . Pt's grandson Benito lives with pt. Pt's son Cordella also helps out with pt. Cordella is Stayvon's uncle, not his father. In discussing with pt's sister [name], pt does have a niece [name](who is [name]' dtr) that was helping pt out at home. [name] recently had knee surgery and cannot help. Pt had HH already arranged when she was discharged on 10-04-2023. Pt needs to stay on diabetic diet which includes avoiding all beverages with sugar/high fructose corn syrup or any other  foods/beverages not on a diabetic diet.  CKD (chronic kidney disease) stage 4, GFR 15-29 ml/min (HCC) - baseline scr 1.7-2.0 10-07-2023 baseline scr 1.7-2.0. Scr stable at 1.8  Insulin  dependent type 2 diabetes mellitus (HCC) 10-07-2023 stable. Continue with lantus  25 units qam. Avoid concentrated sweets including all juices, regular soda, large amounts of fruit, all pastries, etc. Diabetic diet has been prescribed. Continue with Tradjenta  at home. Given her age I am trying to avoid a complicated  insulin  regimen with short and long acting insulins.  CAD (coronary artery disease) 10-07-2023 stable. Continue lipitor 20 mg daily.  GERD 10-07-2023 stable. Continue ppi at home.  Essential hypertension 10-07-2023 stable. Not on any HTN meds. No ACEI/ARB due to CKD stage 4.  DVT prophylaxis: heparin  injection 5,000 Units Start: 10/07/23 0600    Code Status: Full Code Family Communication: spoke with pt's sister [name] who lives in [place] . She has not seen patient in many months/years but does talk to her frequently. Pt's grand son [name] lives with patient.  Pt's son [name] provides some care to patient. Disposition Plan: home Reason for continuing need for hospitalization: stable for DC.  Objective: Vitals:   10/07/23 0930 10/07/23 1000 10/07/23 1030 10/07/23 1056  BP: 96/78 119/85 117/77 113/74  Pulse: 80 79 72   Resp: 10 12 16 16   Temp:    98.6 F (37 C)  TempSrc:    Oral  SpO2: 100% 99% 99% 97%  Weight:      Height:        Intake/Output Summary (Last 24 hours) at 10/07/2023 1522 Last data filed at 10/07/2023 1503 Gross per 24 hour  Intake 1095.82 ml  Output --  Net 1095.82 ml   Filed Weights   10/06/23 1941  Weight: 58.3 kg    Examination:  Physical Exam Vitals and nursing note reviewed.  Constitutional:      General: She is not in acute distress.    Appearance: She is normal weight. She is not toxic-appearing or diaphoretic.  HENT:     Head: Normocephalic and atraumatic.     Nose: Nose normal.  Eyes:     General: No scleral icterus. Cardiovascular:     Rate and Rhythm: Normal rate and regular rhythm.  Pulmonary:     Effort: Pulmonary effort is normal. No respiratory distress.     Breath sounds: Normal breath sounds. No wheezing.  Abdominal:     General: Abdomen is flat. Bowel sounds are normal. There is no distension.     Palpations: Abdomen is soft.     Tenderness: There is no abdominal tenderness.  Musculoskeletal:      Right lower leg: No edema.     Left lower leg: No edema.  Skin:    General: Skin is warm and dry.     Capillary Refill: Capillary refill takes less than 2 seconds.  Neurological:     Mental Status: She is alert and oriented to person, place, and time.     Data Reviewed: I have personally reviewed following labs and imaging studies  CBC: Recent Labs  Lab 10/02/23 0438 10/02/23 0448 10/02/23 0927 10/02/23 1727 10/04/23 0541 10/06/23 1942 10/06/23 1957 10/06/23 2155 10/07/23 0038 10/07/23 0439  WBC 4.5  --  5.1   < > 6.0 6.3  --  6.4 5.9 5.6  NEUTROABS 2.2  --  1.9  --   --   --   --  3.8  --  2.9  HGB 10.7*   < >  11.0*   < > 10.4* 9.9* 10.9*  11.2* 8.8* 8.9* 9.0*  HCT 32.0*   < > 33.1*   < > 32.6* 30.8* 32.0*  33.0* 27.7* 28.8* 29.1*  MCV 85.1  --  85.3   < > 88.1 91.4  --  91.4 92.0 91.2  PLT 250  --  258   < > 276 272  --  247 262 286   < > = values in this interval not displayed.   Basic Metabolic Panel: Recent Labs  Lab 10/02/23 0927 10/02/23 1727 10/02/23 2218 10/03/23 0050 10/04/23 0541 10/06/23 1942 10/06/23 1957 10/06/23 2155 10/07/23 0038 10/07/23 1141  NA 137   < > 134* 138  --  130* 132*  132* 132*  --  140  K 3.6   < > 4.1 4.0  --  4.4 4.4  4.5 4.2  --  4.6  CL 101   < > 102 104  --  96* 97* 102  --  104  CO2 26   < > 24 26  --  22  --  22  --  24  GLUCOSE 185*   < > 393* 224*  --  658* 684* 482*  --  192*  BUN 14   < > 14 14  --  12 14 11   --  11  CREATININE 1.77*   < > 2.07* 2.00*  --  2.23* 2.00* 2.04* 2.00* 1.80*  CALCIUM  9.9   < > 9.1 9.5  --  9.3  --  8.9  --  9.4  MG 1.5*  --   --  1.3* 2.4  --   --   --   --   --   PHOS 2.8  --   --   --   --   --   --   --   --   --    < > = values in this interval not displayed.   GFR: Estimated Creatinine Clearance: 21 mL/min (A) (by C-G formula based on SCr of 1.8 mg/dL (H)). Liver Function Tests: Recent Labs  Lab 10/02/23 0438 10/06/23 1942 10/07/23 1141  AST 23 26 25   ALT 28 18 18    ALKPHOS 175* 125 102  BILITOT 0.9 0.7 0.4  PROT 6.8 7.0 6.4*  ALBUMIN  3.6 3.6 3.1*   Cardiac Enzymes: Recent Labs  Lab 10/02/23 0646  CKTOTAL 58   BNP (last 3 results) Recent Labs    11/02/22 2206 05/23/23 1728 07/17/23 2041  BNP 124.8* 21.5 14.5   CBG: Recent Labs  Lab 10/07/23 0130 10/07/23 0443 10/07/23 0753 10/07/23 0907 10/07/23 1135  GLUCAP 261* 250* 47* 168* 201*   Sepsis Labs: Recent Labs  Lab 10/02/23 0449 10/07/23 1141  PROCALCITON  --  <0.10  LATICACIDVEN 1.2  --     Recent Results (from the past 240 hours)  Resp panel by RT-PCR (RSV, Flu A&B, Covid) Anterior Nasal Swab     Status: None   Collection Time: 10/06/23  8:05 PM   Specimen: Anterior Nasal Swab  Result Value Ref Range Status   SARS Coronavirus 2 by RT PCR NEGATIVE NEGATIVE Final   Influenza A by PCR NEGATIVE NEGATIVE Final   Influenza B by PCR NEGATIVE NEGATIVE Final    Comment: (NOTE) The Xpert Xpress SARS-CoV-2/FLU/RSV plus assay is intended as an aid in the diagnosis of influenza from Nasopharyngeal swab specimens and should not be used as a sole basis for treatment. Nasal washings and aspirates are  unacceptable for Xpert Xpress SARS-CoV-2/FLU/RSV testing.  Fact Sheet for Patients: BloggerCourse.com  Fact Sheet for Healthcare Providers: SeriousBroker.it  This test is not yet approved or cleared by the [place]  FDA and has been authorized for detection and/or diagnosis of SARS-CoV-2 by FDA under an Emergency Use Authorization (EUA). This EUA will remain in effect (meaning this test can be used) for the duration of the COVID-19 declaration under Section 564(b)(1) of the Act, 21 U.S.C. section 360bbb-3(b)(1), unless the authorization is terminated or revoked.     Resp Syncytial Virus by PCR NEGATIVE NEGATIVE Final    Comment: (NOTE) Fact Sheet for Patients: BloggerCourse.com  Fact Sheet for  Healthcare Providers: SeriousBroker.it  This test is not yet approved or cleared by the [place]  FDA and has been authorized for detection and/or diagnosis of SARS-CoV-2 by FDA under an Emergency Use Authorization (EUA). This EUA will remain in effect (meaning this test can be used) for the duration of the COVID-19 declaration under Section 564(b)(1) of the Act, 21 U.S.C. section 360bbb-3(b)(1), unless the authorization is terminated or revoked.  Performed at [facility], [address] [place], [place] [address]   MRSA Next Gen by PCR, Nasal     Status: None   Collection Time: 10/07/23 10:55 AM   Specimen: Nasal Mucosa; Nasal Swab  Result Value Ref Range Status   MRSA by PCR Next Gen NOT DETECTED NOT DETECTED Final    Comment: (NOTE) The GeneXpert MRSA Assay (FDA approved for NASAL specimens only), is one component of a comprehensive MRSA colonization surveillance program. It is not intended to diagnose MRSA infection nor to guide or monitor treatment for MRSA infections. Test performance is not FDA approved in patients less than 6 years old. Performed at [facility], [address], [place], [place] [address]      Radiology Studies: DG Chest Port 1 View Result Date: 10/06/2023 CLINICAL DATA:  Shortness of breath.  Hyperglycemia. EXAM: PORTABLE CHEST 1 VIEW COMPARISON:  Four days ago 10/02/2023 FINDINGS: Low lung volumes persist. Increasing hazy opacity at the left lung base. Stable heart size and mediastinal contours. Aortic atherosclerosis. There may be a small left pleural effusion. No pneumothorax. No pulmonary edema. IMPRESSION: Increasing hazy opacity at the left lung base, atelectasis versus pneumonia. Possible small left pleural effusion. Electronically Signed   By: Andrea Gasman M.D.   On: 10/06/2023 20:23    Scheduled Meds:  allopurinol   100 mg Oral BID   anastrozole   1 mg Oral Daily   atorvastatin   20 mg Oral Daily    DULoxetine   30 mg Oral Daily   gabapentin   100 mg Oral BID   And   gabapentin   200 mg Oral QHS   heparin   5,000 Units Subcutaneous Q8H   hydrOXYzine   50 mg Oral QHS   insulin  aspart  0-9 Units Subcutaneous TID WC   insulin  aspart  6 Units Subcutaneous TID WC   insulin  glargine  12 Units Subcutaneous BID   pantoprazole   40 mg Oral Daily   Continuous Infusions:   LOS: 0 days   Time spent: 60 minutes  Camellia Door, DO  [facility]  10/07/2023, 3:22 PM

## 2023-10-07 NOTE — Discharge Summary (Signed)
 [facility] Phys[facility] Discharge Summary   Patient name: [name]  Admit date:     10/06/2023  Discharge date: 10/07/2023  Attending Physician: FRANKY REDIA SAILOR [[id number]]  Discharge Physician: Camellia Door   PCP: [facility]  Admitted From: Home  Disposition:  Home  Recommendations for Outpatient Follow-up:  Follow up with PCP in 1-2 weeks  Home Health:Yes. Home health was previously arranged on discharge 10-04-2023 Equipment/Devices: None  Discharge Condition:Stable CODE STATUS:FULL Diet recommendation: Renal/Diabetic Fluid Restriction: None  Hospital Summary: CC: hyperglycemia HPI: [name] is a 73 y.o. female with history of diabetes mellitus type 2 who was recently admitted twice in the last 10 days for hyperosmolar status with hemoglobin A1c of more than 15.5 with doubtful compliance with medication was brought to the ER after patient's blood sugar was found to be elevated.  Patient also states she has been having some cough for last few days.  Denies any chest pain nausea vomiting diarrhea.   Discussed with patient's granddaughter Ms. Jackowski who confirms that she did get her Lantus  25 units insulin  at home before coming to the ER.  Patient's granddaughter states that patient drinks a lot of juice.  Patient also was noticed to have increased productive cough before coming to the ER.   ED Course: In the ER patient is afebrile.  Labs show blood glucose of 658 creatinine of 2.2 anion gap of 12 with VBG showing pH of 7.37.  Patient was initially planned to start on Endo tool but after IV fluid bolus patient blood sugar improved to 400 so gave subcu NovoLog  insulin  10 units and admitted for further management.  Chest x-ray showing possible pneumonia and was started on empiric antibiotics.  COVID and flu test were negative  Significant Events: Admitted 10/06/2023 hyperosmolar hyperglycemia   Admission Labs: VBG pH 7.37, PCO2 of  41 WBC 6.3, HgB 9.9, plt 272 Na 130, K 4.4, CO2 of 22, BUN 12, Scr 2.23, glu 658 T protein 7.0, alb 3.6, AST 26, ALT 18, alk phos 125, T. Bili 0.7 Covid/rsv/flu negative Beta hydroxybutyric acid 0.16 Serum Osm 304  Admission Imaging Studies: CXR Increasing hazy opacity at the left lung base, atelectasis versus pneumonia. Possible small left pleural effusion  Significant Labs:   Significant Imaging Studies:   Antibiotic Therapy: Anti-infectives (From admission, onward)    Start     Dose/Rate Route Frequency Ordered Stop   10/06/23 2115  cefTRIAXone  (ROCEPHIN ) 1 g in sodium chloride  0.9 % 100 mL IVPB        1 g 200 mL/hr over 30 Minutes Intravenous Every 24 hours 10/06/23 2109     10/06/23 2115  azithromycin  (ZITHROMAX ) 500 mg in sodium chloride  0.9 % 250 mL IVPB        500 mg 250 mL/hr over 60 Minutes Intravenous Every 24 hours 10/06/23 2111         Procedures:   Consultants:    Hospital Course by Problem: * Hyperosmolar hyperglycemic state (HHS) (HCC) 10-07-2023 fortunately, pt did not require IV insulin  gtts. CBG controlled with SQ insulin . Pt states she drinks grape juice at home. In review of pt's recent admission. CBG controlled with lantus , SSI and prescribed diabetic diet. Discussed with pt's sister Reford Fischer, who lives in [place] . Pt's grandson Benito lives with pt. Pt's son Cordella also helps out with pt. Cordella is Stayvon's uncle, not his father. In discussing with pt's sister Claris, pt does have a niece Dionne(who is Claris' dtr) that  was helping pt out at home. Dionne recently had knee surgery and cannot help. Pt had HH already arranged when she was discharged on 10-04-2023. Pt needs to stay on diabetic diet which includes avoiding all beverages with sugar/high fructose corn syrup or any other foods/beverages not on a diabetic diet.  CKD (chronic kidney disease) stage 4, GFR 15-29 ml/min (HCC) - baseline scr 1.7-2.0 10-07-2023 baseline scr 1.7-2.0. Scr  stable at 1.8  Insulin  dependent type 2 diabetes mellitus (HCC) 10-07-2023 stable. Continue with lantus  25 units qam. Avoid concentrated sweets including all juices, regular soda, large amounts of fruit, all pastries, etc. Diabetic diet has been prescribed. Continue with Tradjenta  at home. Given her age I am trying to avoid a complicated insulin  regimen with short and long acting insulins.  CAD (coronary artery disease) 10-07-2023 stable. Continue lipitor 20 mg daily.  GERD 10-07-2023 stable. Continue ppi at home.  Essential hypertension 10-07-2023 stable. Not on any HTN meds. No ACEI/ARB due to CKD stage 4.    Discharge Diagnoses:  Principal Problem:   Hyperosmolar hyperglycemic state (HHS) (HCC) Active Problems:   Essential hypertension   GERD   CAD (coronary artery disease)   Insulin  dependent type 2 diabetes mellitus (HCC)   CKD (chronic kidney disease) stage 4, GFR 15-29 ml/min (HCC) - baseline scr 1.7-2.0   Discharge Instructions  Discharge Instructions     Call MD for:  difficulty breathing, headache or visual disturbances   Complete by: As directed    Call MD for:  extreme fatigue   Complete by: As directed    Call MD for:  hives   Complete by: As directed    Call MD for:  persistant dizziness or light-headedness   Complete by: As directed    Call MD for:  persistant nausea and vomiting   Complete by: As directed    Call MD for:  redness, tenderness, or signs of infection (pain, swelling, redness, odor or green/yellow discharge around incision site)   Complete by: As directed    Call MD for:  severe uncontrolled pain   Complete by: As directed    Call MD for:  temperature >100.4   Complete by: As directed    Diet - low sodium heart healthy   Complete by: As directed    Diet Carb Modified   Complete by: As directed    Diet renal/carb modified with fluid restriction   Complete by: As directed    2000 ml per day fluid restriction   Discharge instructions    Complete by: As directed    1. Follow up with your primary care provider in 1-2 weeks following discharge from hospital. 2. STOP ALL JUICE INTAKE. Or any other beverage that contains sugar or high fructose corn syrup. You should be ONLY drinking plain water .   Increase activity slowly   Complete by: As directed       Allergies as of 10/07/2023   No Known Allergies      Medication List     STOP taking these medications    methocarbamol  500 MG tablet Commonly known as: ROBAXIN        TAKE these medications    acetaminophen  650 MG CR tablet Commonly known as: TYLENOL  Take 1,300 mg by mouth daily as needed for pain.   albuterol  (2.5 MG/3ML) 0.083% nebulizer solution Commonly known as: PROVENTIL  Take 3 mLs (2.5 mg total) by nebulization every 6 (six) hours as needed for wheezing or shortness of breath.   albuterol  108 (90  Base) MCG/ACT inhaler Commonly known as: VENTOLIN  HFA Inhale 1-2 puffs into the lungs every 6 (six) hours as needed for wheezing or shortness of breath.   allopurinol  100 MG tablet Commonly known as: ZYLOPRIM  Take 100 mg by mouth 2 (two) times daily.   anastrozole  1 MG tablet Commonly known as: ARIMIDEX  TAKE ONE TABLET BY MOUTH ONCE DAILY   atorvastatin  20 MG tablet Commonly known as: LIPITOR Take 20 mg by mouth daily.   DULoxetine  30 MG capsule Commonly known as: CYMBALTA  Take 30 mg by mouth daily.   feeding supplement (GLUCERNA SHAKE) Liqd Take 237 mLs by mouth 2 (two) times daily between meals.   fluticasone  50 MCG/ACT nasal spray Commonly known as: FLONASE  Place 2 sprays into both nostrils daily.   gabapentin  100 MG capsule Commonly known as: NEURONTIN  Take 100-200 mg by mouth See admin instructions. Take one tablet by mouth in the morning and afternoon, then take 2 tablets every night per patient   hydrOXYzine  25 MG tablet Commonly known as: ATARAX  Take 50 mg by mouth at bedtime.   insulin  glargine 100 UNIT/ML Solostar Pen Commonly  known as: LANTUS  Inject 25 Units into the skin daily with breakfast. What changed: when to take this   nitroGLYCERIN  0.4 MG SL tablet Commonly known as: NITROSTAT  Place 1 tablet (0.4 mg total) under the tongue every 5 (five) minutes as needed for chest pain.   pantoprazole  40 MG tablet Commonly known as: PROTONIX  TAKE ONE TABLET BY MOUTH EVERY DAY FOR ACID REFLUX What changed: See the new instructions.   polyethylene glycol 17 g packet Commonly known as: MIRALAX  / GLYCOLAX  Take 17 g by mouth daily.   senna 8.6 MG Tabs tablet Commonly known as: SENOKOT Take 1 tablet (8.6 mg total) by mouth daily as needed for mild constipation or moderate constipation.   Tradjenta  5 MG Tabs tablet Generic drug: linagliptin  Take 1 tablet (5 mg total) by mouth daily.        No Known Allergies  Discharge Exam: Vitals:   10/07/23 1030 10/07/23 1056  BP: 117/77 113/74  Pulse: 72   Resp: 16 16  Temp:  98.6 F (37 C)  SpO2: 99% 97%    Physical Exam Vitals and nursing note reviewed.  Constitutional:      General: She is not in acute distress.    Appearance: She is normal weight. She is not toxic-appearing or diaphoretic.  HENT:     Head: Normocephalic and atraumatic.     Nose: Nose normal.  Eyes:     General: No scleral icterus. Cardiovascular:     Rate and Rhythm: Normal rate and regular rhythm.  Pulmonary:     Effort: Pulmonary effort is normal. No respiratory distress.     Breath sounds: Normal breath sounds. No wheezing.  Abdominal:     General: Abdomen is flat. Bowel sounds are normal. There is no distension.     Palpations: Abdomen is soft.     Tenderness: There is no abdominal tenderness.  Musculoskeletal:     Right lower leg: No edema.     Left lower leg: No edema.  Skin:    General: Skin is warm and dry.     Capillary Refill: Capillary refill takes less than 2 seconds.  Neurological:     Mental Status: She is alert and oriented to person, place, and time.      The results of significant diagnostics from this hospitalization (including imaging, microbiology, ancillary and laboratory) are listed below for reference.  Microbiology: Recent Results (from the past 240 hours)  Resp panel by RT-PCR (RSV, Flu A&B, Covid) Anterior Nasal Swab     Status: None   Collection Time: 10/06/23  8:05 PM   Specimen: Anterior Nasal Swab  Result Value Ref Range Status   SARS Coronavirus 2 by RT PCR NEGATIVE NEGATIVE Final   Influenza A by PCR NEGATIVE NEGATIVE Final   Influenza B by PCR NEGATIVE NEGATIVE Final    Comment: (NOTE) The Xpert Xpress SARS-CoV-2/FLU/RSV plus assay is intended as an aid in the diagnosis of influenza from Nasopharyngeal swab specimens and should not be used as a sole basis for treatment. Nasal washings and aspirates are unacceptable for Xpert Xpress SARS-CoV-2/FLU/RSV testing.  Fact Sheet for Patients: BloggerCourse.com  Fact Sheet for Healthcare Providers: SeriousBroker.it  This test is not yet approved or cleared by the [place]  FDA and has been authorized for detection and/or diagnosis of SARS-CoV-2 by FDA under an Emergency Use Authorization (EUA). This EUA will remain in effect (meaning this test can be used) for the duration of the COVID-19 declaration under Section 564(b)(1) of the Act, 21 U.S.C. section 360bbb-3(b)(1), unless the authorization is terminated or revoked.     Resp Syncytial Virus by PCR NEGATIVE NEGATIVE Final    Comment: (NOTE) Fact Sheet for Patients: BloggerCourse.com  Fact Sheet for Healthcare Providers: SeriousBroker.it  This test is not yet approved or cleared by the [place]  FDA and has been authorized for detection and/or diagnosis of SARS-CoV-2 by FDA under an Emergency Use Authorization (EUA). This EUA will remain in effect (meaning this test can be used) for the duration of  the COVID-19 declaration under Section 564(b)(1) of the Act, 21 U.S.C. section 360bbb-3(b)(1), unless the authorization is terminated or revoked.  Performed at [facility], [address], [place], [place] [address]   MRSA Next Gen by PCR, Nasal     Status: None   Collection Time: 10/07/23 10:55 AM   Specimen: Nasal Mucosa; Nasal Swab  Result Value Ref Range Status   MRSA by PCR Next Gen NOT DETECTED NOT DETECTED Final    Comment: (NOTE) The GeneXpert MRSA Assay (FDA approved for NASAL specimens only), is one component of a comprehensive MRSA colonization surveillance program. It is not intended to diagnose MRSA infection nor to guide or monitor treatment for MRSA infections. Test performance is not FDA approved in patients less than 61 years old. Performed at [facility], [address] [place], [place] [address]      Labs: BNP (last 3 results) Recent Labs    11/02/22 2206 05/23/23 1728 07/17/23 2041  BNP 124.8* 21.5 14.5   Basic Metabolic Panel: Recent Labs  Lab 10/02/23 0927 10/02/23 1727 10/02/23 2218 10/03/23 0050 10/04/23 0541 10/06/23 1942 10/06/23 1957 10/06/23 2155 10/07/23 0038 10/07/23 1141  NA 137   < > 134* 138  --  130* 132*  132* 132*  --  140  K 3.6   < > 4.1 4.0  --  4.4 4.4  4.5 4.2  --  4.6  CL 101   < > 102 104  --  96* 97* 102  --  104  CO2 26   < > 24 26  --  22  --  22  --  24  GLUCOSE 185*   < > 393* 224*  --  658* 684* 482*  --  192*  BUN 14   < > 14 14  --  12 14 11   --  11  CREATININE 1.77*   < > 2.07* 2.00*  --  2.23* 2.00* 2.04* 2.00* 1.80*  CALCIUM  9.9   < > 9.1 9.5  --  9.3  --  8.9  --  9.4  MG 1.5*  --   --  1.3* 2.4  --   --   --   --   --   PHOS 2.8  --   --   --   --   --   --   --   --   --    < > = values in this interval not displayed.   Liver Function Tests: Recent Labs  Lab 10/02/23 0438 10/06/23 1942 10/07/23 1141  AST 23 26 25   ALT 28 18 18   ALKPHOS 175* 125 102  BILITOT 0.9 0.7 0.4  PROT  6.8 7.0 6.4*  ALBUMIN  3.6 3.6 3.1*   CBC: Recent Labs  Lab 10/02/23 0438 10/02/23 0448 10/02/23 0927 10/02/23 1727 10/04/23 0541 10/06/23 1942 10/06/23 1957 10/06/23 2155 10/07/23 0038 10/07/23 0439  WBC 4.5  --  5.1   < > 6.0 6.3  --  6.4 5.9 5.6  NEUTROABS 2.2  --  1.9  --   --   --   --  3.8  --  2.9  HGB 10.7*   < > 11.0*   < > 10.4* 9.9* 10.9*  11.2* 8.8* 8.9* 9.0*  HCT 32.0*   < > 33.1*   < > 32.6* 30.8* 32.0*  33.0* 27.7* 28.8* 29.1*  MCV 85.1  --  85.3   < > 88.1 91.4  --  91.4 92.0 91.2  PLT 250  --  258   < > 276 272  --  247 262 286   < > = values in this interval not displayed.   Cardiac Enzymes: Recent Labs  Lab 10/02/23 0646  CKTOTAL 58   CBG: Recent Labs  Lab 10/07/23 0130 10/07/23 0443 10/07/23 0753 10/07/23 0907 10/07/23 1135  GLUCAP 261* 250* 47* 168* 201*   Urinalysis    Component Value Date/Time   COLORURINE STRAW (A) 10/06/2023 2014   APPEARANCEUR CLEAR 10/06/2023 2014   LABSPEC 1.014 10/06/2023 2014   PHURINE 6.0 10/06/2023 2014   GLUCOSEU >=500 (A) 10/06/2023 2014   HGBUR NEGATIVE 10/06/2023 2014   BILIRUBINUR NEGATIVE 10/06/2023 2014   KETONESUR NEGATIVE 10/06/2023 2014   PROTEINUR NEGATIVE 10/06/2023 2014   UROBILINOGEN 0.2 03/31/2014 2017   NITRITE NEGATIVE 10/06/2023 2014   LEUKOCYTESUR SMALL (A) 10/06/2023 2014   Sepsis Labs Recent Labs  Lab 10/06/23 1942 10/06/23 2155 10/07/23 0038 10/07/23 0439 10/07/23 1141  PROCALCITON  --   --   --   --  <0.10  WBC 6.3 6.4 5.9 5.6  --     Procedures/Studies: DG Chest Port 1 View Result Date: 10/06/2023 CLINICAL DATA:  Shortness of breath.  Hyperglycemia. EXAM: PORTABLE CHEST 1 VIEW COMPARISON:  Four days ago 10/02/2023 FINDINGS: Low lung volumes persist. Increasing hazy opacity at the left lung base. Stable heart size and mediastinal contours. Aortic atherosclerosis. There may be a small left pleural effusion. No pneumothorax. No pulmonary edema. IMPRESSION: Increasing hazy  opacity at the left lung base, atelectasis versus pneumonia. Possible small left pleural effusion. Electronically Signed   By: Andrea Gasman M.D.   On: 10/06/2023 20:23   DG Chest Portable 1 View Result Date: 10/02/2023 EXAM: 1 VIEW XRAY OF THE CHEST 10/02/2023 06:23:09 AM COMPARISON: 09/26/2023 CLINICAL HISTORY: Hyperglycemia. FINDINGS: LUNGS AND PLEURA:  Mild atelectasis versus early airspace disease in the left lung base. No pleural effusion. No pneumothorax. HEART AND MEDIASTINUM: Aortic atherosclerotic calcification. BONES AND SOFT TISSUES: Status post left shoulder arthroplasty. Surgical clips noted in the right axilla. No acute osseous abnormality. IMPRESSION: 1. Mild atelectasis versus early airspace disease in the left lung base. Electronically signed by: Waddell Calk MD 10/02/2023 06:34 AM EDT RP Workstation: [id number]   CT ABDOMEN PELVIS WO CONTRAST Result Date: 09/26/2023 CLINICAL DATA:  Abdominal pain, nausea vomiting. EXAM: CT ABDOMEN AND PELVIS WITHOUT CONTRAST TECHNIQUE: Multidetector CT imaging of the abdomen and pelvis was performed following the standard protocol without IV contrast. RADIATION DOSE REDUCTION: This exam was performed according to the departmental dose-optimization program which includes automated exposure control, adjustment of the mA and/or kV according to patient size and/or use of iterative reconstruction technique. COMPARISON:  CT abdomen pelvis dated 09/26/2023. FINDINGS: Evaluation of this exam is limited in the absence of intravenous contrast as well as due to respiratory motion. Lower chest: Bibasilar subpleural atelectasis. No intra-abdominal free air or free fluid. Hepatobiliary: The liver is unremarkable. No biliary dilatation. Cholecystectomy. No retained calcified stone noted in the central CBD. Pancreas: Unremarkable. No pancreatic ductal dilatation or surrounding inflammatory changes. Spleen: Normal in size without focal abnormality. Adrenals/Urinary Tract:  The adrenal glands unremarkable. There is no hydronephrosis or nephrolithiasis on either side. Indeterminate hypodense lesions in the left kidney not evaluated on this CT, possibly cysts. This can be better evaluated with ultrasound on a nonemergent/outpatient basis. The visualized ureters and urinary bladder appear unremarkable. Stomach/Bowel: There is postsurgical changes of bowel with anastomotic staple line in the right lower quadrant. There is no bowel obstruction or active inflammation. Appendectomy. Vascular/Lymphatic: Moderate aortoiliac atherosclerotic disease. The IVC is unremarkable. No portal venous gas. There is no adenopathy. Reproductive: Small calcified fibroid. No suspicious adnexal masses. Other: Midline vertical anterior pelvic wall incisional scar. Musculoskeletal: Degenerative changes of the spine. No acute osseous pathology. IMPRESSION: 1. No acute intra-abdominal or pelvic pathology. 2. Postsurgical changes of bowel. No bowel obstruction. 3.  Aortic Atherosclerosis (ICD10-I70.0). Electronically Signed   By: Vanetta Chou M.D.   On: 09/26/2023 18:39   DG Chest Portable 1 View Result Date: 09/26/2023 CLINICAL DATA:  Productive cough. EXAM: PORTABLE CHEST 1 VIEW COMPARISON:  07/17/2023 FINDINGS: Poor inspiration. Stable borderline enlarged cardiac silhouette. Clear lungs with normal vascularity. Tortuous and partially calcified thoracic aorta. Left shoulder prosthesis. IMPRESSION: No acute abnormality. Electronically Signed   By: Elspeth Bathe M.D.   On: 09/26/2023 15:36    Time coordinating discharge: 60 mins  SIGNED:  Camellia Door, DO [facility] 10/07/23, 3:28 PM

## 2023-10-07 NOTE — ED Notes (Signed)
 Assumed pt care at this time, pt resting denies pain or needs

## 2023-10-07 NOTE — ED Notes (Signed)
 Called transport to transport pt and all belongings upstairs

## 2023-10-07 NOTE — Assessment & Plan Note (Signed)
 10-07-2023 stable. Continue ppi at home.

## 2023-10-07 NOTE — Plan of Care (Signed)
  Problem: Metabolic: Goal: Ability to maintain appropriate glucose levels will improve Outcome: Progressing   Problem: Nutrition: Goal: Adequate nutrition will be maintained Outcome: Progressing   Problem: Pain Managment: Goal: General experience of comfort will improve and/or be controlled Outcome: Progressing

## 2023-10-07 NOTE — Progress Notes (Signed)
 Patient provided with verbal discharge instructions. Pts friend Ozell at bedside during d/c.  Two copies of discharge summary given. RN educated patient on importance following low sodium/carb modified diet. RN also education pt on the importance of medication compliance. RN answered all questions. VSS at discharge. IV removed. Patient belongings sent with patient. Patient dc'd via wheelchair to private vehicle.

## 2023-10-07 NOTE — Assessment & Plan Note (Signed)
 10-07-2023 stable. Not on any HTN meds. No ACEI/ARB due to CKD stage 4.

## 2023-10-07 NOTE — Subjective & Objective (Signed)
 Pt seen and examined. Procalcitonin is negative. Pt does NOT have CAP. Abx stopped. CBG controlled with SQ lantus  and diet.

## 2023-10-07 NOTE — Assessment & Plan Note (Signed)
 10-07-2023 stable. Continue with lantus  25 units qam. Avoid concentrated sweets including all juices, regular soda, large amounts of fruit, all pastries, etc. Diabetic diet has been prescribed. Continue with Tradjenta  at home. Given her age I am trying to avoid a complicated insulin  regimen with short and long acting insulins.

## 2023-10-07 NOTE — ED Notes (Signed)
 Pt alert to voice and a/oX4 gave juice and soda will re check cbg

## 2023-10-07 NOTE — Assessment & Plan Note (Signed)
 10-07-2023 stable. Continue lipitor 20 mg daily.

## 2023-10-07 NOTE — Assessment & Plan Note (Signed)
 10-07-2023 baseline scr 1.7-2.0. Scr stable at 1.8

## 2023-10-07 NOTE — Inpatient Diabetes Management (Signed)
 Inpatient Diabetes Program Recommendations  AACE/ADA: New Consensus Statement on Inpatient Glycemic Control (2015)  Target Ranges:  Prepandial:   less than 140 mg/dL      Peak postprandial:   less than 180 mg/dL (1-2 hours)      Critically ill patients:  140 - 180 mg/dL   Lab Results  Component Value Date   GLUCAP 164 (H) 10/07/2023   HGBA1C >15.5 (H) 10/02/2023    Review of Glycemic Control  Diabetes history: DM2 Outpatient Diabetes medications: Lantus  15 BID,Per PCP note on 8/21, glipizide  2.5 mg QAM listed as home medication    Current orders for Inpatient glycemic control:  Lantus  12 BID, Novolog  6 units TID with meals + 0-9 TID with meals  Inpatient Diabetes Program Recommendations:    Discharge Meds:  Lantus  25 units QAM, tradjenta  5 mg daily  Spoke with Garrel, family friend/caregiver regarding going home on Lantus  25 units QAM. Taught insulin  pen administration and Garrel will relay info to son. Garrel states pt's grandson is in the process of being kicked out of pt's house, due to neglect and not taking care of pt. Garrel is caregiver and will make sure pt gets her insulin  daily. Pt said she monitors her blood sugars with glucose meter and we reviewed hypoglycemia s/s and treatment. It appears pt does not have 24H coverage. Sister is out of town and son stops by during day. This is 3rd hospitalization in a week. Needs more consistency with caregiving.  Appears to need SNF or regular assistance at home for safe discharge.  Thank you. Shona Brandy, RD, LDN, CDCES Inpatient Diabetes Coordinator [phone]

## 2023-10-07 NOTE — Assessment & Plan Note (Signed)
 10-07-2023 fortunately, pt did not require IV insulin  gtts. CBG controlled with SQ insulin . Pt states she drinks grape juice at home. In review of pt's recent admission. CBG controlled with lantus , SSI and prescribed diabetic diet. Discussed with pt's sister Reford Fischer, who lives in [place] . Pt's grandson Benito lives with pt. Pt's son Cordella also helps out with pt. Cordella is Stayvon's uncle, not his father. In discussing with pt's sister Claris, pt does have a niece Dionne(who is Claris' dtr) that was helping pt out at home. Dionne recently had knee surgery and cannot help. Pt had HH already arranged when she was discharged on 10-04-2023. Pt needs to stay on diabetic diet which includes avoiding all beverages with sugar/high fructose corn syrup or any other foods/beverages not on a diabetic diet.

## 2023-10-07 NOTE — Hospital Course (Signed)
 CC: hyperglycemia HPI: [name] is a 73 y.o. female with history of diabetes mellitus type 2 who was recently admitted twice in the last 10 days for hyperosmolar status with hemoglobin A1c of more than 15.5 with doubtful compliance with medication was brought to the ER after patient's blood sugar was found to be elevated.  Patient also states she has been having some cough for last few days.  Denies any chest pain nausea vomiting diarrhea.   Discussed with patient's granddaughter Ms. Somes who confirms that she did get her Lantus  25 units insulin  at home before coming to the ER.  Patient's granddaughter states that patient drinks a lot of juice.  Patient also was noticed to have increased productive cough before coming to the ER.   ED Course: In the ER patient is afebrile.  Labs show blood glucose of 658 creatinine of 2.2 anion gap of 12 with VBG showing pH of 7.37.  Patient was initially planned to start on Endo tool but after IV fluid bolus patient blood sugar improved to 400 so gave subcu NovoLog  insulin  10 units and admitted for further management.  Chest x-ray showing possible pneumonia and was started on empiric antibiotics.  COVID and flu test were negative  Significant Events: Admitted 10/06/2023 hyperosmolar hyperglycemia   Admission Labs: VBG pH 7.37, PCO2 of 41 WBC 6.3, HgB 9.9, plt 272 Na 130, K 4.4, CO2 of 22, BUN 12, Scr 2.23, glu 658 T protein 7.0, alb 3.6, AST 26, ALT 18, alk phos 125, T. Bili 0.7 Covid/rsv/flu negative Beta hydroxybutyric acid 0.16 Serum Osm 304  Admission Imaging Studies: CXR Increasing hazy opacity at the left lung base, atelectasis versus pneumonia. Possible small left pleural effusion  Significant Labs:   Significant Imaging Studies:   Antibiotic Therapy: Anti-infectives (From admission, onward)    Start     Dose/Rate Route Frequency Ordered Stop   10/06/23 2115  cefTRIAXone  (ROCEPHIN ) 1 g in sodium chloride  0.9 % 100 mL IVPB         1 g 200 mL/hr over 30 Minutes Intravenous Every 24 hours 10/06/23 2109     10/06/23 2115  azithromycin  (ZITHROMAX ) 500 mg in sodium chloride  0.9 % 250 mL IVPB        500 mg 250 mL/hr over 60 Minutes Intravenous Every 24 hours 10/06/23 2111         Procedures:   Consultants:

## 2023-10-21 IMAGING — MG DIGITAL SCREENING UNILAT LEFT W/ TOMO W/ CAD
4 series · 4 of 12 positions shown · non-contrast
Comparison: Previous exam(s).

CLINICAL DATA: Screening.

EXAM:
DIGITAL SCREENING UNILATERAL LEFT MAMMOGRAM WITH CAD AND
TOMOSYNTHESIS
TECHNIQUE: Left screening digital craniocaudal and mediolateral oblique
mammograms were obtained. Left screening digital breast
tomosynthesis was performed. The images were evaluated with
computer-aided detection.

[L CC synth-2D]
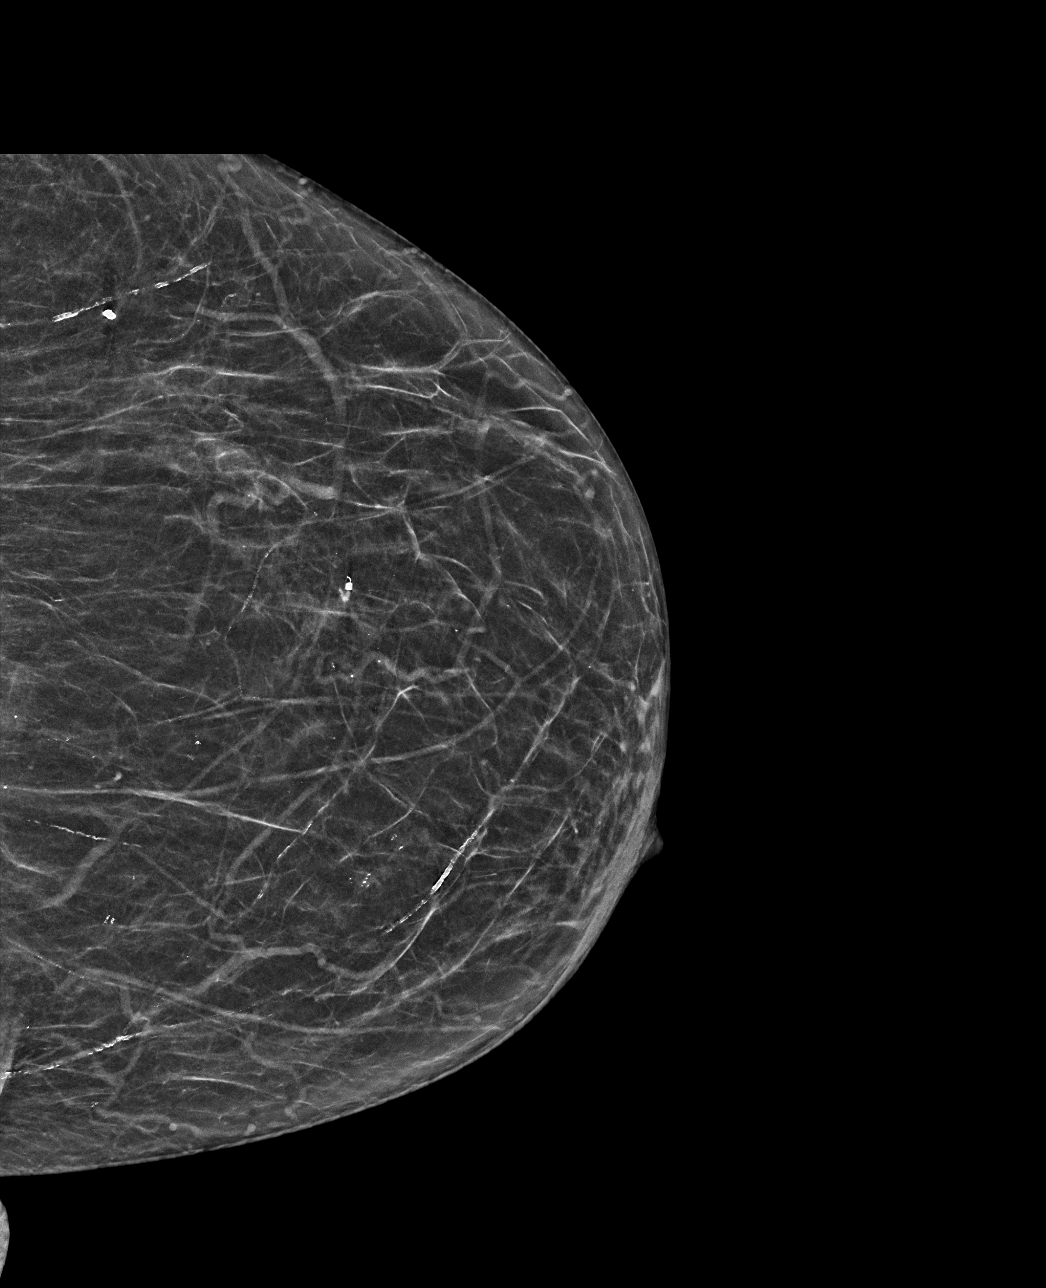

[L MLO synth-2D]
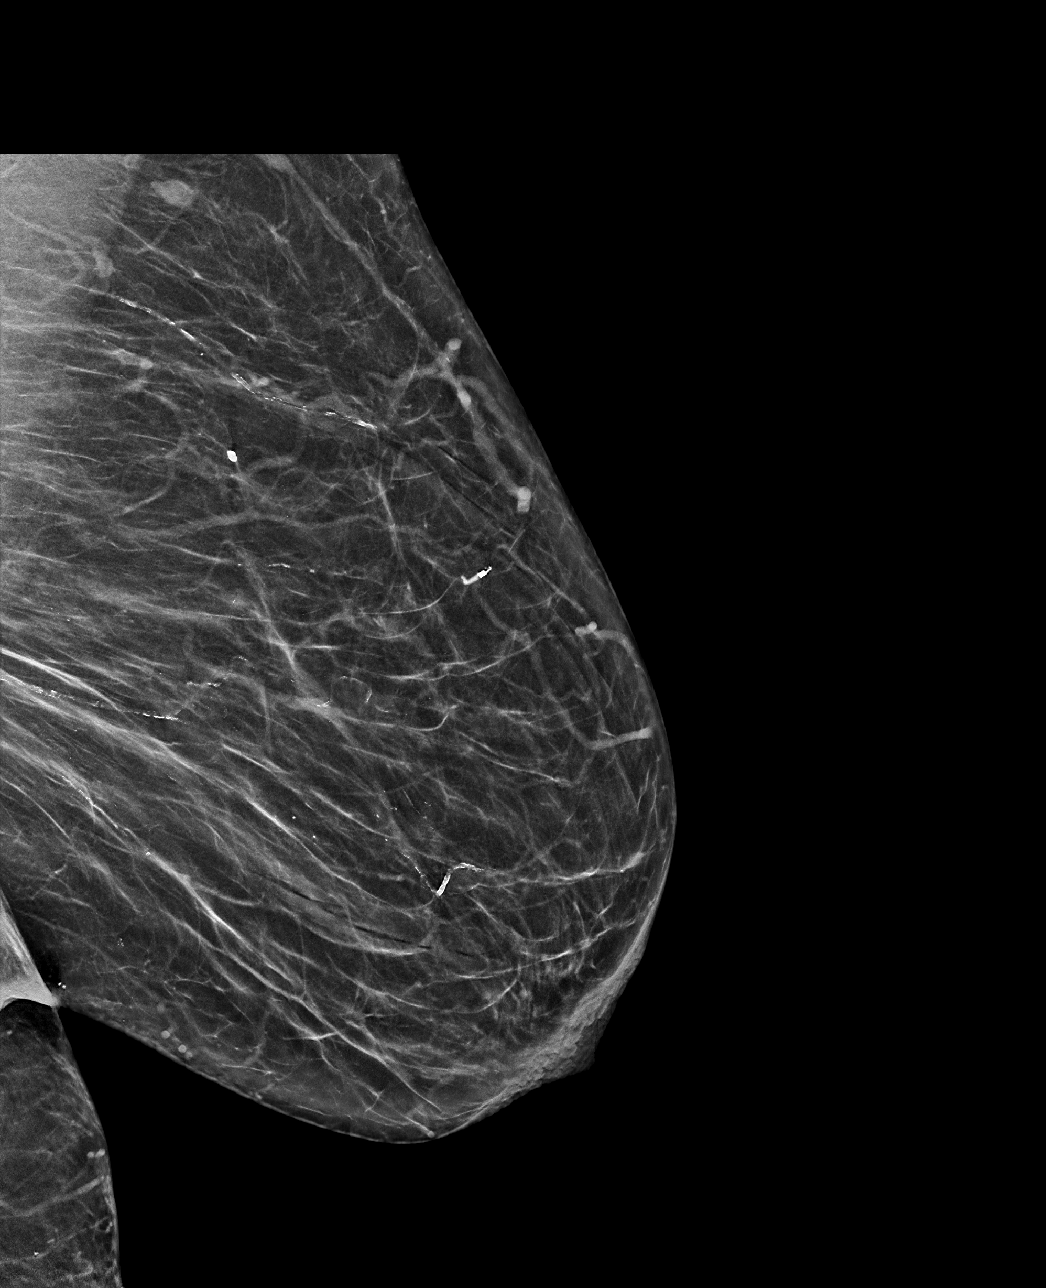

[L MLO tomo · tomo slice 31/60.0]
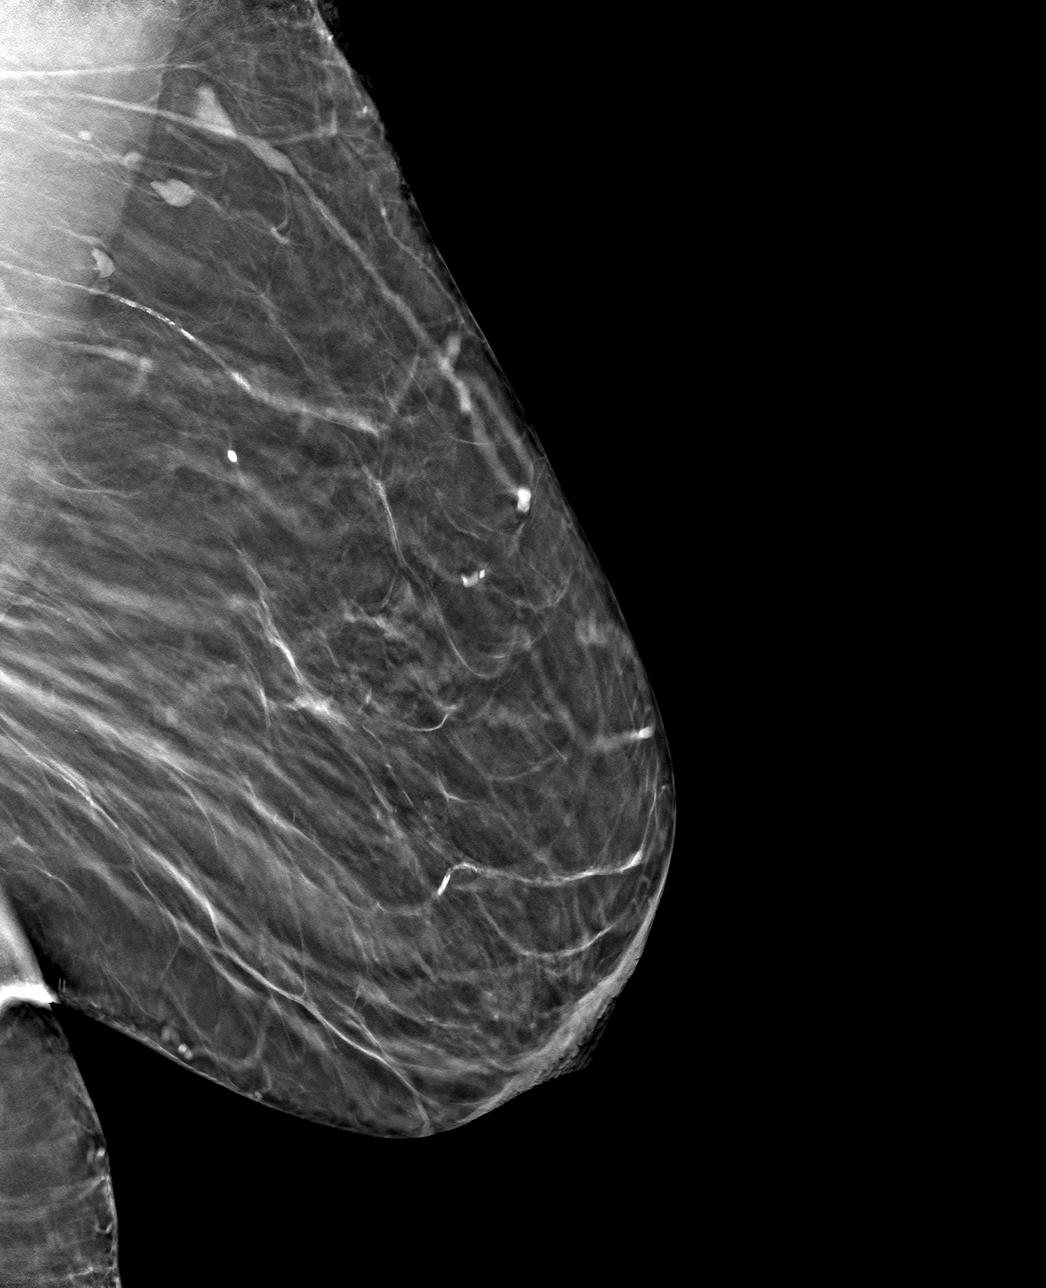

[L CC tomo · tomo slice 24/47.0]
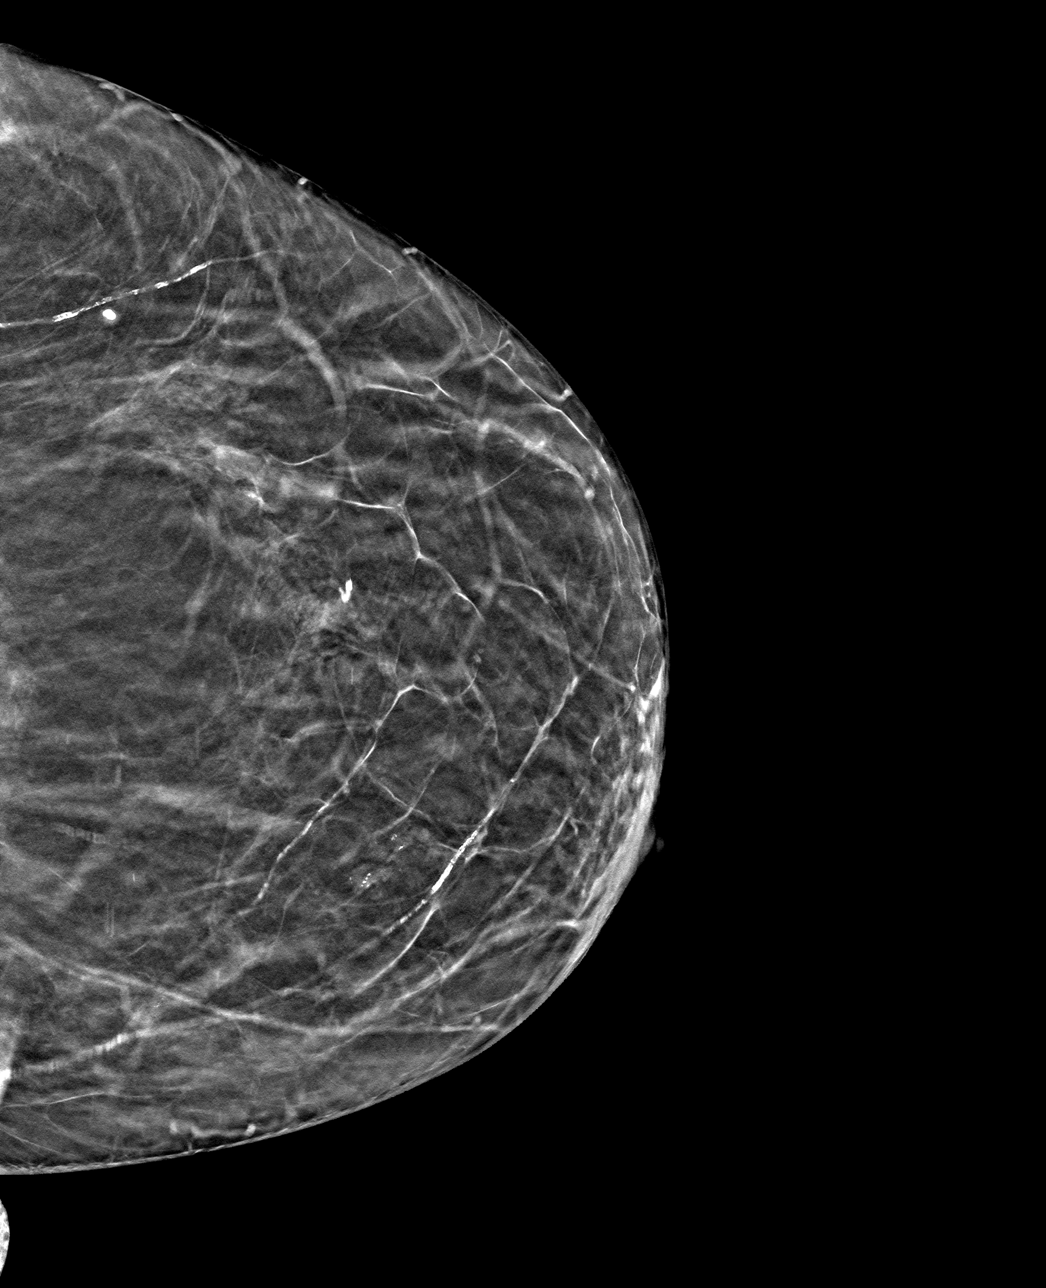

[4 of 12 positions shown; findings below may reference images not displayed]

ACR Breast Density Category b: There are scattered areas of
fibroglandular density.
FINDINGS: The patient has had a right mastectomy. There are no findings
suspicious for malignancy.
IMPRESSION: No mammographic evidence of malignancy. A result letter of this
screening mammogram will be mailed directly to the patient.

RECOMMENDATION:
Screening mammogram in one year.  (Code:[id number])

BI-RADS CATEGORY  1: Negative.

## 2023-10-31 ENCOUNTER — Other Ambulatory Visit: Payer: Self-pay

## 2023-10-31 ENCOUNTER — Inpatient Hospital Stay

## 2023-10-31 ENCOUNTER — Inpatient Hospital Stay: Attending: Hematology and Oncology

## 2023-10-31 DIAGNOSIS — D472 Monoclonal gammopathy: Secondary | ICD-10-CM | POA: Insufficient documentation

## 2023-10-31 DIAGNOSIS — Z79811 Long term (current) use of aromatase inhibitors: Secondary | ICD-10-CM | POA: Diagnosis not present

## 2023-10-31 DIAGNOSIS — Z170 Estrogen receptor positive status [ER+]: Secondary | ICD-10-CM

## 2023-10-31 DIAGNOSIS — C50411 Malignant neoplasm of upper-outer quadrant of right female breast: Secondary | ICD-10-CM | POA: Insufficient documentation

## 2023-10-31 DIAGNOSIS — Z17410 Hormone receptor positive with human epidermal growth factor receptor 2 positive status: Secondary | ICD-10-CM | POA: Insufficient documentation

## 2023-10-31 LAB — CBC WITH DIFFERENTIAL (CANCER CENTER ONLY)
Abs Immature Granulocytes: 0.07 K/uL (ref 0.00–0.07)
Basophils Absolute: 0.1 K/uL (ref 0.0–0.1)
Basophils Relative: 1 %
Eosinophils Absolute: 0.2 K/uL (ref 0.0–0.5)
Eosinophils Relative: 5 %
HCT: 36.5 % (ref 36.0–46.0)
Hemoglobin: 11.4 g/dL — ABNORMAL LOW (ref 12.0–15.0)
Immature Granulocytes: 1 %
Lymphocytes Relative: 28 %
Lymphs Abs: 1.4 K/uL (ref 0.7–4.0)
MCH: 29.1 pg (ref 26.0–34.0)
MCHC: 31.2 g/dL (ref 30.0–36.0)
MCV: 93.1 fL (ref 80.0–100.0)
Monocytes Absolute: 0.5 K/uL (ref 0.1–1.0)
Monocytes Relative: 9 %
Neutro Abs: 2.9 K/uL (ref 1.7–7.7)
Neutrophils Relative %: 56 %
Platelet Count: 286 K/uL (ref 150–400)
RBC: 3.92 MIL/uL (ref 3.87–5.11)
RDW: 15.4 % (ref 11.5–15.5)
WBC Count: 5.2 K/uL (ref 4.0–10.5)
nRBC: 0 % (ref 0.0–0.2)

## 2023-10-31 LAB — CMP (CANCER CENTER ONLY)
ALT: 8 U/L (ref 0–44)
AST: 10 U/L — ABNORMAL LOW (ref 15–41)
Albumin: 4.1 g/dL (ref 3.5–5.0)
Alkaline Phosphatase: 151 U/L — ABNORMAL HIGH (ref 38–126)
Anion gap: 6 (ref 5–15)
BUN: 22 mg/dL (ref 8–23)
CO2: 28 mmol/L (ref 22–32)
Calcium: 9.5 mg/dL (ref 8.9–10.3)
Chloride: 106 mmol/L (ref 98–111)
Creatinine: 1.99 mg/dL — ABNORMAL HIGH (ref 0.44–1.00)
GFR, Estimated: 26 mL/min — ABNORMAL LOW (ref >60)
Glucose, Bld: 202 mg/dL — ABNORMAL HIGH (ref 70–99)
Potassium: 4.3 mmol/L (ref 3.5–5.1)
Sodium: 140 mmol/L (ref 135–145)
Total Bilirubin: 0.7 mg/dL (ref 0.0–1.2)
Total Protein: 8 g/dL (ref 6.5–8.1)

## 2023-11-01 LAB — KAPPA/LAMBDA LIGHT CHAINS
Kappa free light chain: 213.3 mg/L — ABNORMAL HIGH (ref 3.3–19.4)
Kappa, lambda light chain ratio: 11.05 — ABNORMAL HIGH (ref 0.26–1.65)
Lambda free light chains: 19.3 mg/L (ref 5.7–26.3)

## 2023-11-02 LAB — BETA 2 MICROGLOBULIN, SERUM: Beta-2 Microglobulin: 4 mg/L — ABNORMAL HIGH (ref 0.6–2.4)

## 2023-11-05 LAB — MULTIPLE MYELOMA PANEL, SERUM
Albumin SerPl Elph-Mcnc: 3.4 g/dL (ref 2.9–4.4)
Albumin/Glob SerPl: 0.9 (ref 0.7–1.7)
Alpha 1: 0.2 g/dL (ref 0.0–0.4)
Alpha2 Glob SerPl Elph-Mcnc: 1 g/dL (ref 0.4–1.0)
B-Globulin SerPl Elph-Mcnc: 1 g/dL (ref 0.7–1.3)
Gamma Glob SerPl Elph-Mcnc: 1.7 g/dL (ref 0.4–1.8)
Globulin, Total: 3.9 g/dL (ref 2.2–3.9)
IgA: 84 mg/dL (ref 64–422)
IgG (Immunoglobin G), Serum: 2188 mg/dL — ABNORMAL HIGH (ref 586–1602)
IgM (Immunoglobulin M), Srm: 21 mg/dL — ABNORMAL LOW (ref 26–217)
M Protein SerPl Elph-Mcnc: 1.4 g/dL — ABNORMAL HIGH
Total Protein ELP: 7.3 g/dL (ref 6.0–8.5)

## 2023-11-07 ENCOUNTER — Inpatient Hospital Stay

## 2023-11-07 ENCOUNTER — Inpatient Hospital Stay: Attending: Hematology and Oncology | Admitting: Hematology and Oncology

## 2023-11-07 VITALS — BP 111/66 | HR 92 | Temp 97.6°F | Resp 18 | Ht 59.0 in | Wt 133.9 lb

## 2023-11-07 DIAGNOSIS — Z170 Estrogen receptor positive status [ER+]: Secondary | ICD-10-CM

## 2023-11-07 DIAGNOSIS — C50411 Malignant neoplasm of upper-outer quadrant of right female breast: Secondary | ICD-10-CM | POA: Diagnosis present

## 2023-11-07 DIAGNOSIS — Z17410 Hormone receptor positive with human epidermal growth factor receptor 2 positive status: Secondary | ICD-10-CM | POA: Insufficient documentation

## 2023-11-07 DIAGNOSIS — D472 Monoclonal gammopathy: Secondary | ICD-10-CM | POA: Diagnosis not present

## 2023-11-07 DIAGNOSIS — Z79811 Long term (current) use of aromatase inhibitors: Secondary | ICD-10-CM | POA: Insufficient documentation

## 2023-11-07 NOTE — Assessment & Plan Note (Signed)
 History of breast cancer: Currently on anastrozole  therapy and doing well. Breast cancer surveillance:  Mammogram 03/26/2023 left breast: Benign breast density category B Breast exam 05/01/2023: Benign   Right chest wall pain: Related to prior scar tissue.  She is taking gabapentin .  I do not believe there is any concern for breast cancer recurrence.

## 2023-11-07 NOTE — Assessment & Plan Note (Signed)
 Lab review: 11/14/2020: M spike: 1.9 g, IgG 2884, immunofixation shows IgG kappa Creatinine: 2.38 (baseline creatinine 1.6), calcium  9.7, albumin  4.5, hemoglobin 13.1 11/29/2020: M spike 1.6 g IgG kappa,Kappa lambda ratio: 9 (Kappa 136.9, lambda 15.2),beta-2  microglobulin: 3.4, creatinine 2.3, hemoglobin 12 03/17/2021: M spike: 1.3 g IgG kappa, kappa lambda ratio 7.72 (Kappa 195, lambda 25), creatinine 1.95, calcium  9.9, albumin  3.8, hemoglobin 10 09/13/2021: M spike 1.6 g, IgG kappa, IgG level 2091 Kappa to 72, lambda 21.1: Ratio 12.89, creatinine 2.36, calcium  10 03/23/2022: Hemoglobin 11.8, SPEP pending 04/22/2023: Hemoglobin 10.7, beta-2  microglobulin 4.2, Kappa 235, K:L ratio 13.78, IgG 1947, M protein 1.5 g 10/31/2023: Hemoglobin 11.4, beta-2  microglobulin 4, Kappa 213, ratio 11.05, IgG 2188, M protein 1.4 g  The serum M protein levels have been fluctuating but overall they have been lower than the time of diagnosis in October 22    Acute renal failure could be related to underlying cardiac issues. Anemia due to chronic kidney disease: Stable creatinine  Recheck labs and follow-up in 1 year

## 2023-11-07 NOTE — Progress Notes (Signed)
 Patient Care Team: [facility] as PCP - General Odean Potts, MD as Consulting Physician (Hematology and Oncology)  DIAGNOSIS:  Encounter Diagnoses  Name Primary?   Malignant neoplasm of upper-outer quadrant of right breast in female, estrogen receptor positive (HCC) Yes   MGUS (monoclonal gammopathy of unknown significance)     SUMMARY OF ONCOLOGIC HISTORY: Oncology History  Malignant neoplasm of upper-outer quadrant of right breast in female, estrogen receptor positive (HCC)  09/03/2017 Initial Diagnosis   Screening mammogram detected 1.4 cm right breast mass at 10 o'clock position, additional pleomorphic calcifications 2 cm posteriorly, additional loosely grouped calcifications 10.1 x 7.1 x 5.3 cm, single right axillary lymph node: Biopsy revealed DCIS ER 100%, PR 50%; IDC grade 2-3 ER 100%, PR 50%, Ki-67 15%, HER-2 positive ratio 2, copy #5, lymph node negative T2 N0 stage Ia clinical stage AJCC 8   09/03/2017 Cancer Staging   Staging form: Breast, AJCC 8th Edition - Clinical stage from 09/03/2017: Stage IB (cT2, cN0, cM0, G3, ER+, PR+, HER2+)   11/27/2017 Surgery   Right mastectomy ([facility]) ([id number]): IDC grade 2, 1.6 cm, separate foci of DCIS intermediate grade, margins negative, 1/5 LN positive for micrometasatic carcinoma. ER 100%, PR 50%, Ki-67 15%, HER-2 positive ratio 2, copy #5 T1c N0 stage Ia   11/27/2017 Cancer Staging   Staging form: Breast, AJCC 8th Edition - Pathologic stage from 11/27/2017: Stage IA (pT1c, pN0, cM0, G2, ER+, PR+, HER2+)     02/07/2018 - 03/12/2019 Chemotherapy   trastuzumab  (HERCEPTIN ) 483 mg in sodium chloride  0.9 % 250 mL chemo infusion, 8 mg/kg = 483 mg, Intravenous,  Once, 11 of 11 cycles. Administration: 483 mg (02/07/2018), 357 mg (02/27/2018), 357 mg (05/01/2018), 357 mg (05/23/2018), 357 mg (06/12/2018), 357 mg (07/03/2018), 357 mg (08/14/2018), 357 mg (09/04/2018), 357 mg (10/17/2018), 357 mg (03/20/2018), 357 mg  (04/10/2018)  trastuzumab -anns (KANJINTI ) 357 mg in sodium chloride  0.9 % 250 mL chemo infusion, 6 mg/kg = 357 mg (100 % of original dose 6 mg/kg), Intravenous,  Once, 7 of 7 cycles. Dose modification: 6 mg/kg (original dose 6 mg/kg, Cycle 12, Reason: Other (see comments)). Administration: 357 mg (11/06/2018), 357 mg (11/27/2018), 357 mg (12/18/2018), 357 mg (01/08/2019), 357 mg (01/29/2019), 357 mg (02/19/2019), 357 mg (03/12/2019).   12/2018 - 12/2023 Anti-estrogen oral therapy   Anastrozole      CHIEF COMPLIANT: Follow-up of MGUS  HISTORY OF PRESENT ILLNESS:  History of Present Illness [name] is a 73 year old female who presents for routine follow-up.  Her M protein levels have been decreasing over the past few years, with the most recent measurement at 1.4 grams, down from 1.5 grams last year and 1.7 grams the year before. She feels well overall.  She has had multiple hospital admissions recently, specifically on September 1st and in August, due to issues related to blood sugar control. Her blood sugar control is inconsistent.     ALLERGIES:  has no known allergies.  MEDICATIONS:  Current Outpatient Medications  Medication Sig Dispense Refill   acetaminophen  (TYLENOL ) 650 MG CR tablet Take 1,300 mg by mouth daily as needed for pain.     albuterol  (PROVENTIL ) (2.5 MG/3ML) 0.083% nebulizer solution Take 3 mLs (2.5 mg total) by nebulization every 6 (six) hours as needed for wheezing or shortness of breath. 75 mL 3   albuterol  (VENTOLIN  HFA) 108 (90 Base) MCG/ACT inhaler Inhale 1-2 puffs into the lungs every 6 (six) hours as needed for wheezing or shortness of breath.  18 g 2   allopurinol  (ZYLOPRIM ) 100 MG tablet Take 100 mg by mouth 2 (two) times daily.     anastrozole  (ARIMIDEX ) 1 MG tablet TAKE ONE TABLET BY MOUTH ONCE DAILY 90 tablet 0   atorvastatin  (LIPITOR) 20 MG tablet Take 20 mg by mouth daily.     DULoxetine  (CYMBALTA ) 30 MG capsule Take 30 mg by mouth daily.      feeding supplement, GLUCERNA SHAKE, (GLUCERNA SHAKE) LIQD Take 237 mLs by mouth 2 (two) times daily between meals. 14220 mL 1   fluticasone  (FLONASE ) 50 MCG/ACT nasal spray Place 2 sprays into both nostrils daily. 16 g 6   gabapentin  (NEURONTIN ) 100 MG capsule Take 100-200 mg by mouth See admin instructions. Take one tablet by mouth in the morning and afternoon, then take 2 tablets every night per patient     hydrOXYzine  (ATARAX ) 25 MG tablet Take 50 mg by mouth at bedtime.     insulin  glargine (LANTUS ) 100 UNIT/ML Solostar Pen Inject 25 Units into the skin daily with breakfast.     linagliptin  (TRADJENTA ) 5 MG TABS tablet Take 1 tablet (5 mg total) by mouth daily. 30 tablet 2   nitroGLYCERIN  (NITROSTAT ) 0.4 MG SL tablet Place 1 tablet (0.4 mg total) under the tongue every 5 (five) minutes as needed for chest pain. 10 tablet 0   pantoprazole  (PROTONIX ) 40 MG tablet TAKE ONE TABLET BY MOUTH EVERY DAY FOR ACID REFLUX (Patient taking differently: Take 40 mg by mouth daily.) 90 tablet 0   polyethylene glycol (MIRALAX  / GLYCOLAX ) 17 g packet Take 17 g by mouth daily. 14 each 0   senna (SENOKOT) 8.6 MG TABS tablet Take 1 tablet (8.6 mg total) by mouth daily as needed for mild constipation or moderate constipation. 120 tablet 0   No current facility-administered medications for this visit.    PHYSICAL EXAMINATION: ECOG PERFORMANCE STATUS: 1 - Symptomatic but completely ambulatory  Vitals:   11/07/23 0922  BP: 111/66  Pulse: 92  Resp: 18  Temp: 97.6 F (36.4 C)  SpO2: 98%   Filed Weights   11/07/23 0922  Weight: 133 lb 14.4 oz (60.7 kg)    LABORATORY DATA:  I have reviewed the data as listed    Latest Ref Rng & Units 10/31/2023    9:39 AM 10/07/2023   11:41 AM 10/07/2023   12:38 AM  CMP  Glucose 70 - 99 mg/dL 797  807    BUN 8 - 23 mg/dL 22  11    Creatinine 9.55 - 1.00 mg/dL 8.00  8.19  7.99   Sodium 135 - 145 mmol/L 140  140    Potassium 3.5 - 5.1 mmol/L 4.3  4.6    Chloride 98 - 111  mmol/L 106  104    CO2 22 - 32 mmol/L 28  24    Calcium  8.9 - 10.3 mg/dL 9.5  9.4    Total Protein 6.5 - 8.1 g/dL 8.0  6.4    Total Bilirubin 0.0 - 1.2 mg/dL 0.7  0.4    Alkaline Phos 38 - 126 U/L 151  102    AST 15 - 41 U/L 10  25    ALT 0 - 44 U/L 8  18      Lab Results  Component Value Date   WBC 5.2 10/31/2023   HGB 11.4 (L) 10/31/2023   HCT 36.5 10/31/2023   MCV 93.1 10/31/2023   PLT 286 10/31/2023   NEUTROABS 2.9 10/31/2023    ASSESSMENT &  PLAN:  Malignant neoplasm of upper-outer quadrant of right breast in female, estrogen receptor positive (HCC) History of breast cancer: Currently on anastrozole  therapy and doing well. Breast cancer surveillance:  Mammogram 03/26/2023 left breast: Benign breast density category B Breast exam 05/01/2023: Benign   Right chest wall pain: Related to prior scar tissue.  She is taking gabapentin .  I do not believe there is any concern for breast cancer recurrence.  MGUS (monoclonal gammopathy of unknown significance) Lab review: 11/14/2020: M spike: 1.9 g, IgG 2884, immunofixation shows IgG kappa Creatinine: 2.38 (baseline creatinine 1.6), calcium  9.7, albumin  4.5, hemoglobin 13.1 11/29/2020: M spike 1.6 g IgG kappa,Kappa lambda ratio: 9 (Kappa 136.9, lambda 15.2),beta-2  microglobulin: 3.4, creatinine 2.3, hemoglobin 12 03/17/2021: M spike: 1.3 g IgG kappa, kappa lambda ratio 7.72 (Kappa 195, lambda 25), creatinine 1.95, calcium  9.9, albumin  3.8, hemoglobin 10 09/13/2021: M spike 1.6 g, IgG kappa, IgG level 2091 Kappa to 72, lambda 21.1: Ratio 12.89, creatinine 2.36, calcium  10 03/23/2022: Hemoglobin 11.8, SPEP pending 04/22/2023: Hemoglobin 10.7, beta-2  microglobulin 4.2, Kappa 235, K:L ratio 13.78, IgG 1947, M protein 1.5 g 10/31/2023: Hemoglobin 11.4, beta-2  microglobulin 4, Kappa 213, ratio 11.05, IgG 2188, M protein 1.4 g  The serum M protein levels have been fluctuating but overall they have been lower than the time of diagnosis in October 22     Acute renal failure could be related to underlying cardiac issues. Anemia due to chronic kidney disease: Stable creatinine  Recheck labs and follow-up 2 weeks after that with a telephone visit ------------------------------------- Assessment and Plan Assessment & Plan Monoclonal gammopathy of undetermined significance (MGUS) M protein level decreased from 1.7 grams to 1.4 grams, indicating improvement. - Monitor M protein levels with blood work every six months. - Schedule follow-up appointment in six months for lab work and phone consultation.  Estrogen receptor positive malignant neoplasm of upper-outer quadrant of right breast Condition well-managed with satisfactory mammograms. - Continue routine monitoring as per current management plan.      No orders of the defined types were placed in this encounter.  The patient has a good understanding of the overall plan. she agrees with it. she will call with any problems that may develop before the next visit here. Total time spent: 30 mins including face to face time and time spent for planning, charting and co-ordination of care   Viinay K Darianny Momon, MD 11/07/23

## 2023-11-23 IMAGING — CR DG SHOULDER 2+V*L*
3 series · 3 of 3 positions shown · non-contrast
Comparison: 09/09/2020

CLINICAL DATA: Shoulder pain.  Recent surgery 03/10/2021.

EXAM:
LEFT SHOULDER - 2+ VIEW

[x shoulder ap left (1 of 3)]
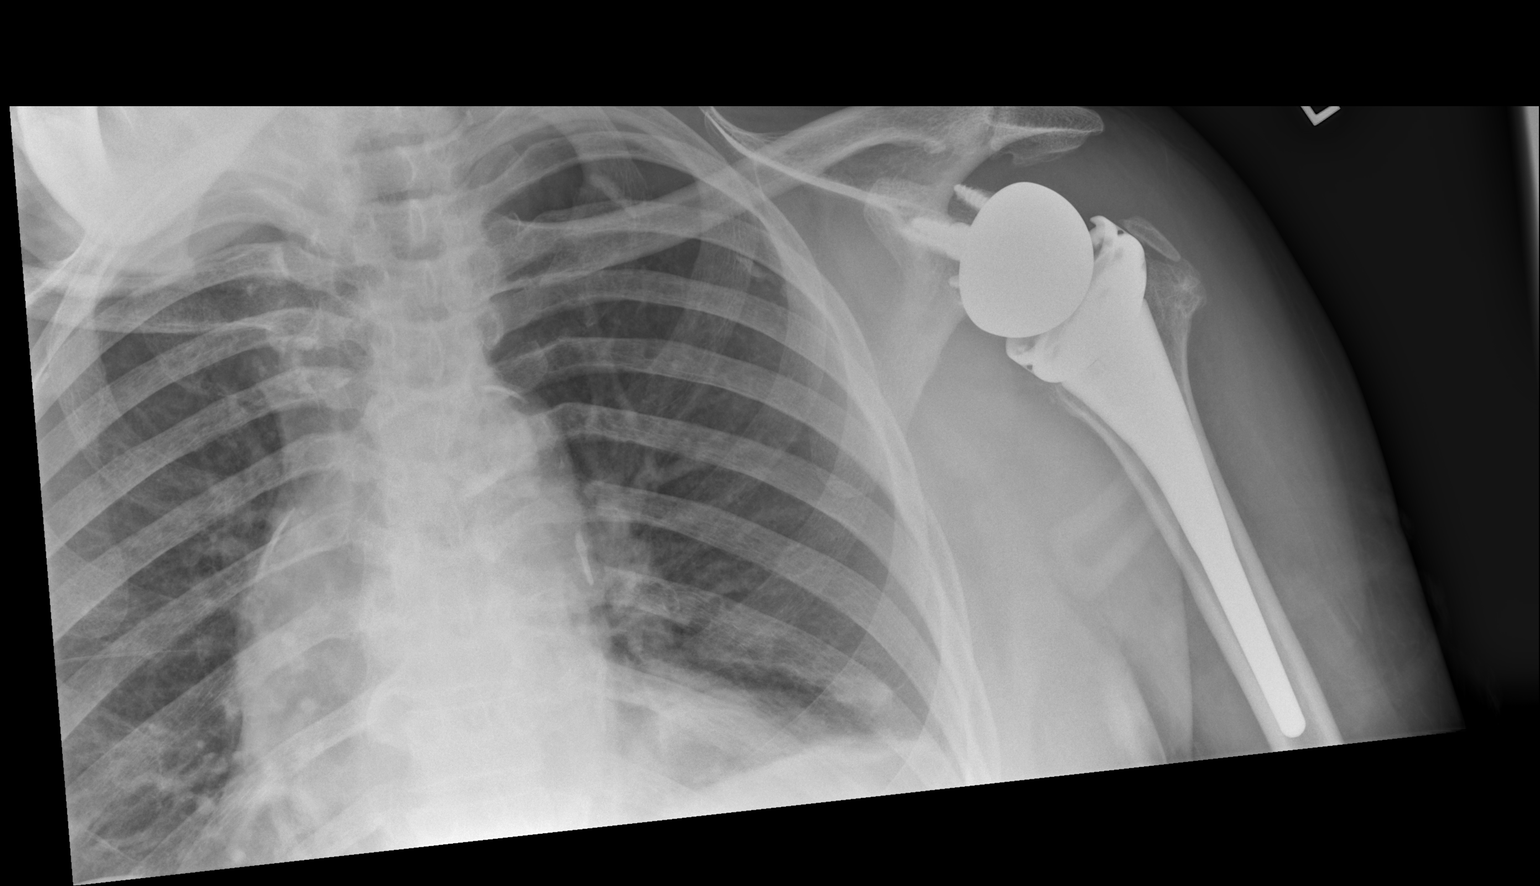

[x shoulder ap left (2 of 3)]
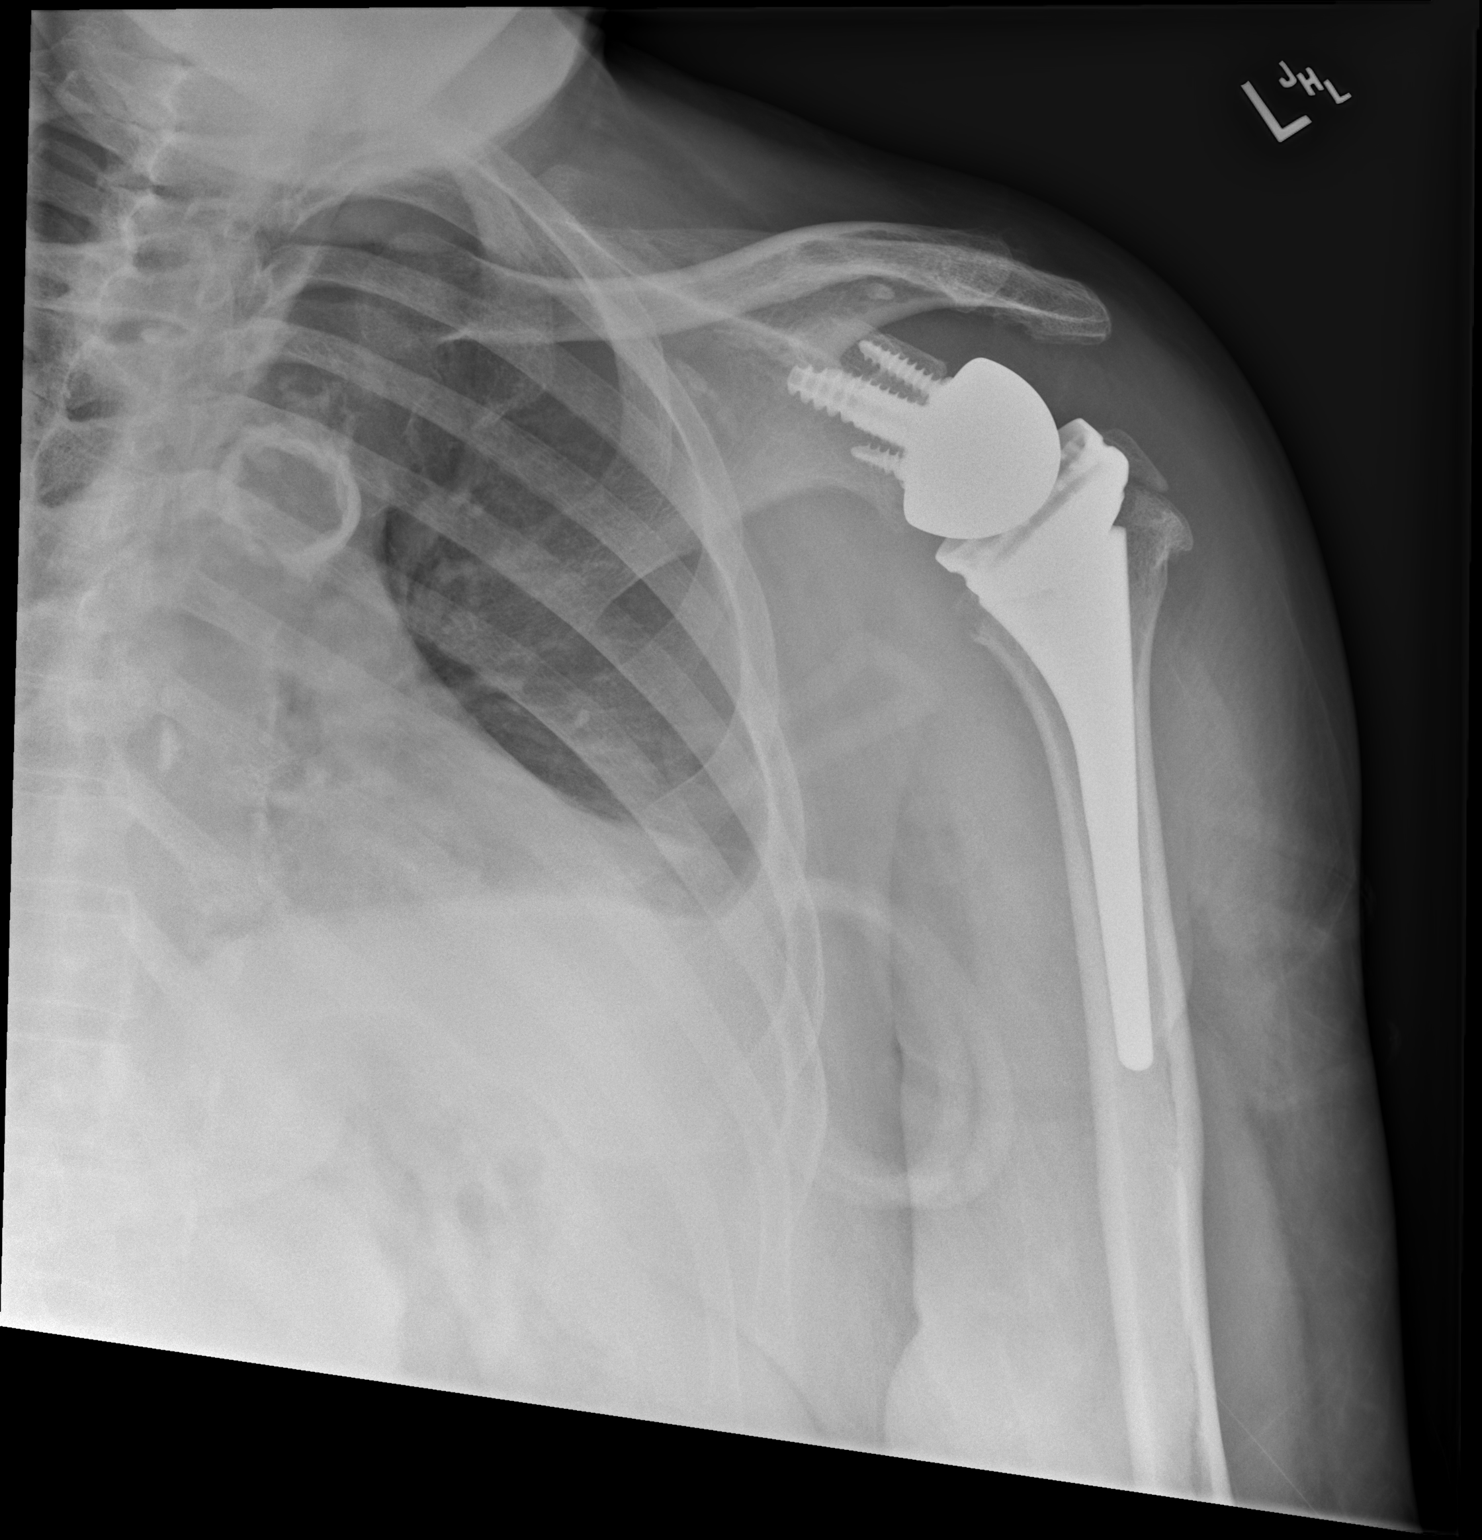

[x shoulder ap left (3 of 3)]
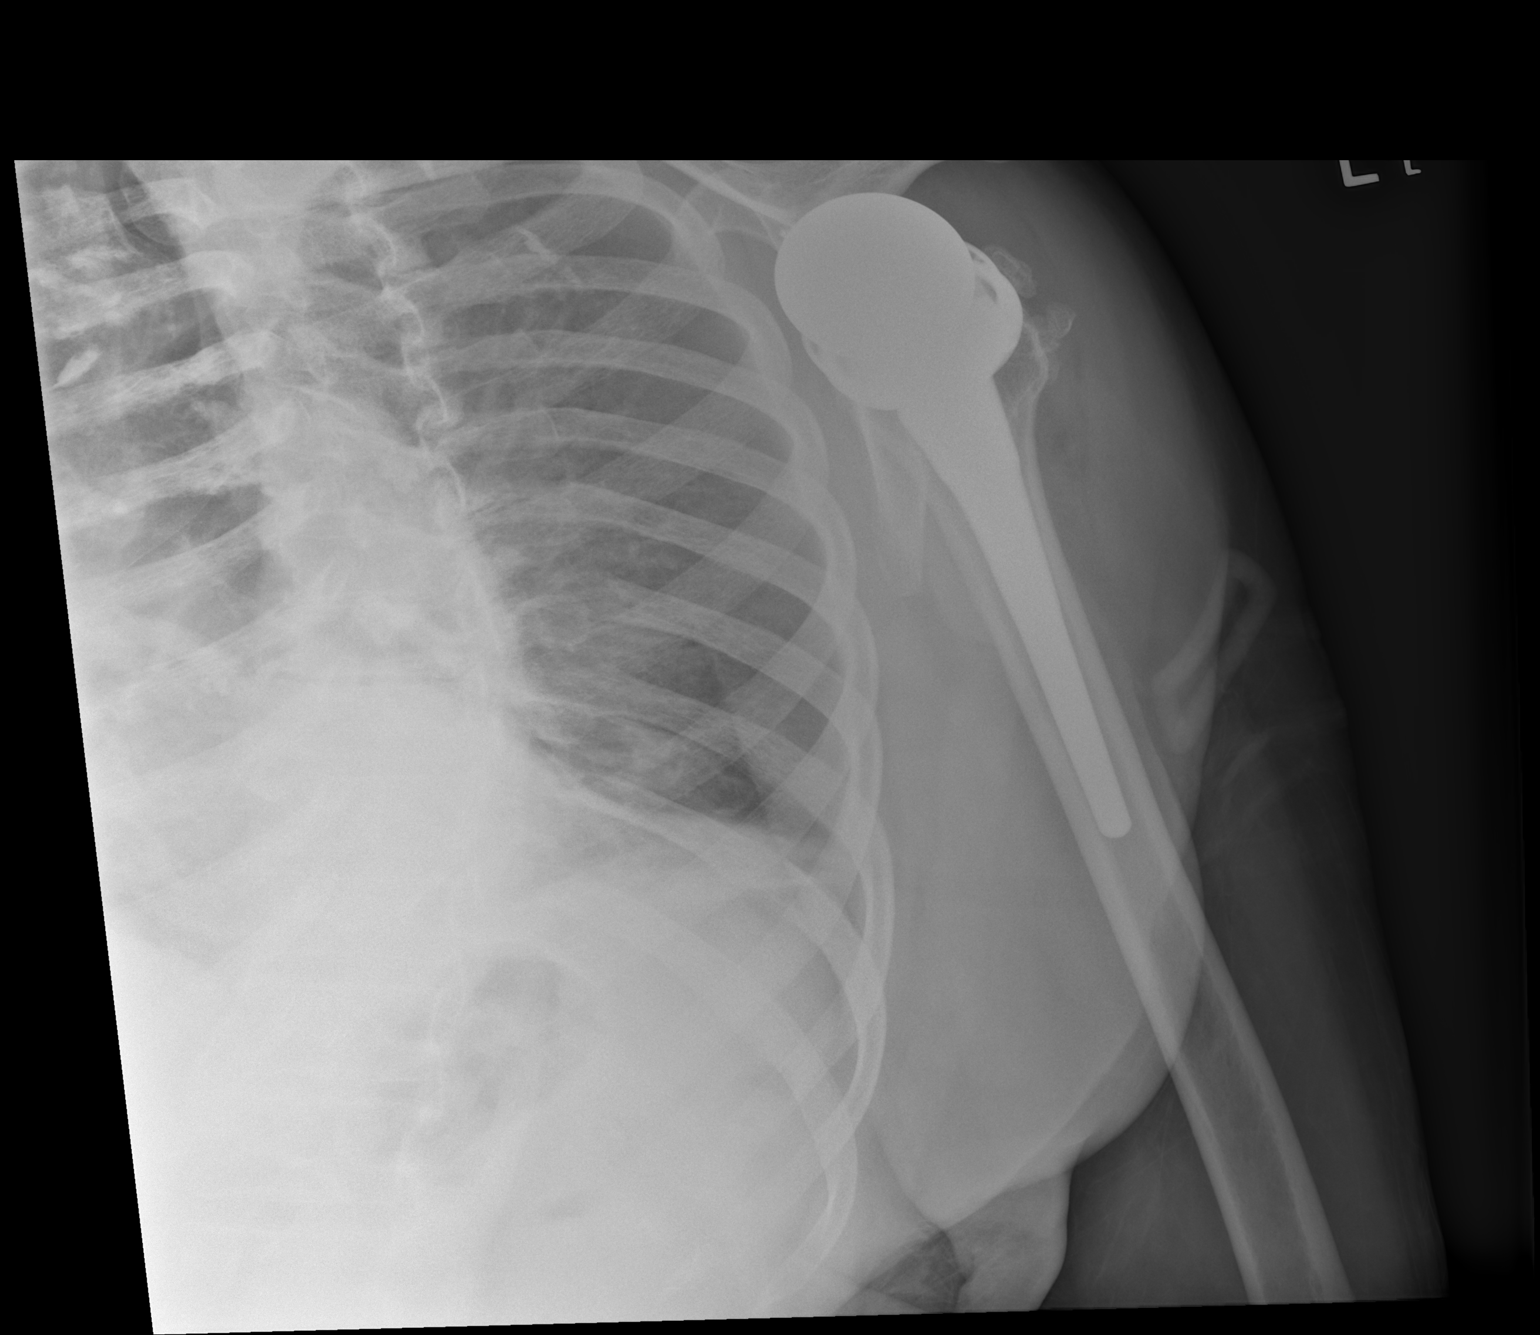

[3 of 3 positions shown; findings below may reference images not displayed]

FINDINGS: Left reverse arthroplasty noted with expected orientation. No
periprosthetic fracture or other early complicating feature is
identified. AC joint alignment normal.

Dense atherosclerotic calcification of the thoracic aorta noted.

I do not see any abnormal gas tracking in the soft tissues.
IMPRESSION: 1. Left reverse shoulder arthroplasty is observed with expected
alignment and no early complicating features identified.

## 2023-11-23 IMAGING — CR DG CHEST 2V
2 series · 2 of 2 positions shown · non-contrast
Comparison: AP Lat exam 12/08/2018.

CLINICAL DATA: Fever and pain. Left shoulder replacement 4 days
ago.

EXAM:
CHEST - 2 VIEW

[w chest lat]
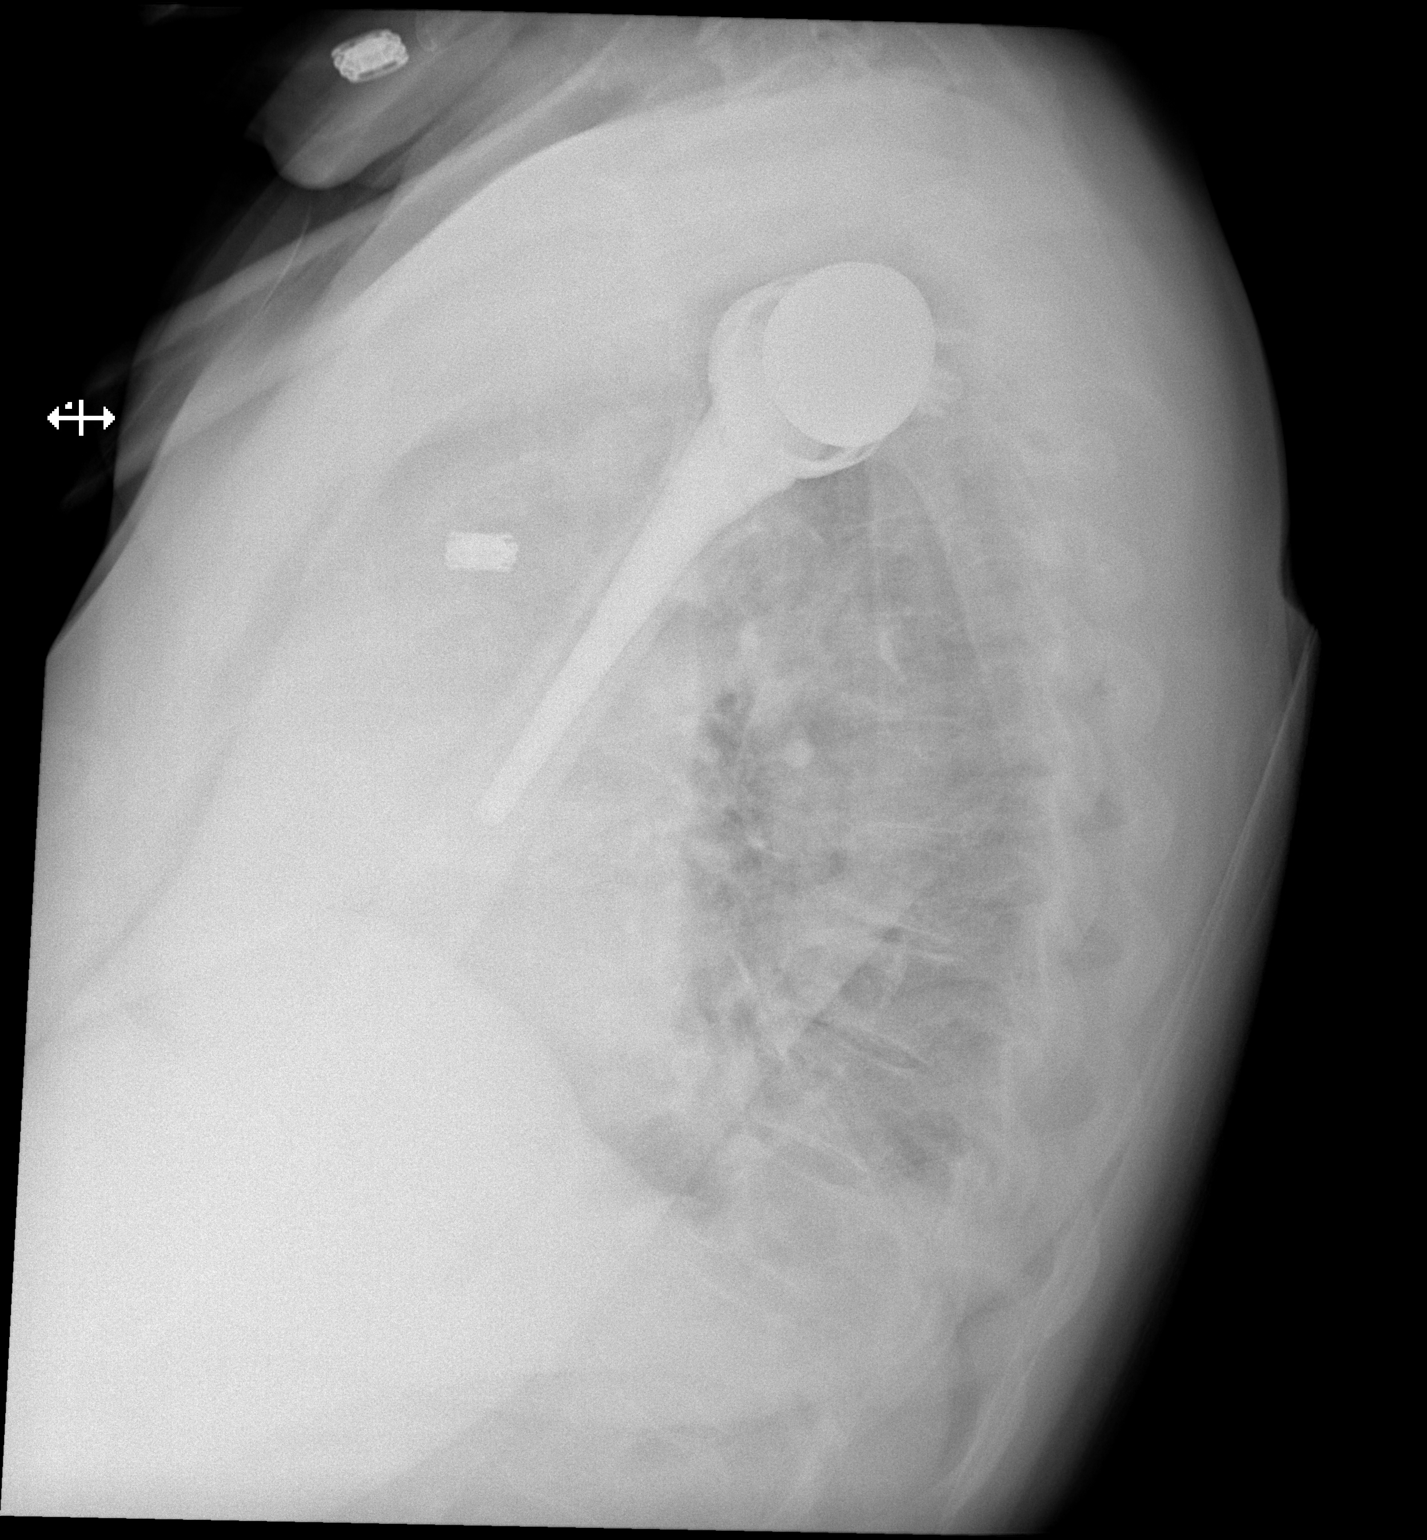

[x chest ap]
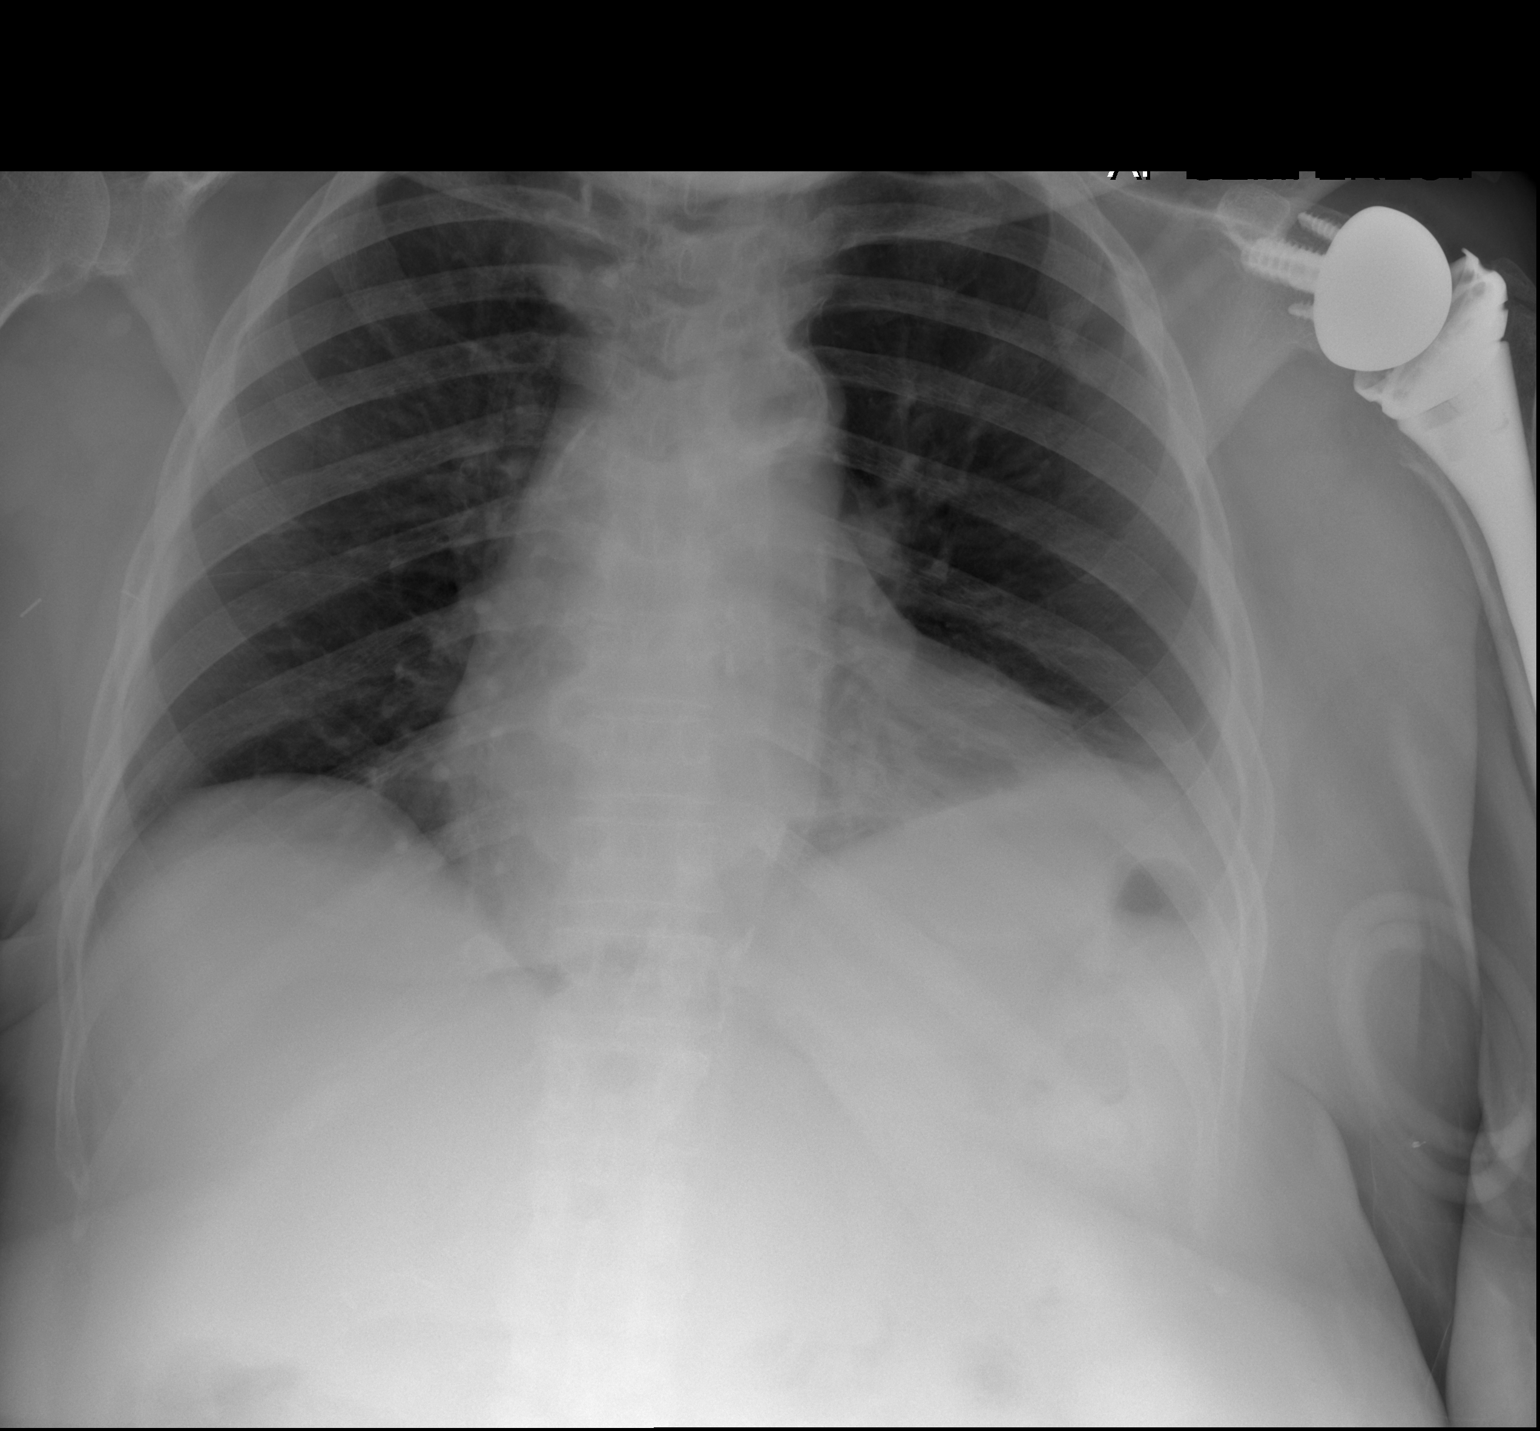

[2 of 2 positions shown; findings below may reference images not displayed]

FINDINGS: Interval removal prior left chest port and IJ approach catheter.
Stable mediastinum. Mild aortic uncoiling is noted with heavy aortic
calcification.

The heart is slightly enlarged. Central vessels are normal caliber.
The lungs are hypoexpanded, but on the lateral view there is
suspected increased opacity in the posterior basal left lower lobe
concerning for pneumonia or aspiration, otherwise could be
asymmetric atelectasis.

The remaining hypoexpanded lungs are clear. No pleural effusion is
seen. Interval left shoulder replacement with reverse arthroplasty.
Right shoulder DJD as before with thoracic spondylosis.
IMPRESSION: 1. Suspected increased opacity in the left posterior base,
concerning for pneumonia or aspiration, alternatively could be due
to low lung volumes.
2. Aortic atherosclerosis.
3. Slight cardiomegaly.
4. Left shoulder replacement.

## 2023-11-28 ENCOUNTER — Other Ambulatory Visit: Payer: Self-pay

## 2023-11-28 ENCOUNTER — Emergency Department (HOSPITAL_COMMUNITY)

## 2023-11-28 ENCOUNTER — Emergency Department (HOSPITAL_COMMUNITY)
Admission: EM | Admit: 2023-11-28 | Discharge: 2023-11-28 | Disposition: A | Discharge status: Home / Self Care | Source: Ambulatory Visit | Attending: Student in an Organized Health Care Education/Training Program | Admitting: Student in an Organized Health Care Education/Training Program

## 2023-11-28 DIAGNOSIS — M545 Low back pain, unspecified: Secondary | ICD-10-CM | POA: Diagnosis present

## 2023-11-28 DIAGNOSIS — X58XXXA Exposure to other specified factors, initial encounter: Secondary | ICD-10-CM | POA: Diagnosis not present

## 2023-11-28 DIAGNOSIS — Z794 Long term (current) use of insulin: Secondary | ICD-10-CM | POA: Diagnosis not present

## 2023-11-28 DIAGNOSIS — M48061 Spinal stenosis, lumbar region without neurogenic claudication: Secondary | ICD-10-CM | POA: Insufficient documentation

## 2023-11-28 MED ORDER — ACETAMINOPHEN 325 MG PO TABS
650.0000 mg | ORAL_TABLET | Freq: Once | ORAL | Status: AC
  Administered 2023-11-28: 650 mg via ORAL
  Filled 2023-11-28: qty 2

## 2023-11-28 MED ORDER — OXYCODONE HCL 5 MG PO TABS
5.0000 mg | ORAL_TABLET | Freq: Once | ORAL | Status: AC
  Administered 2023-11-28: 5 mg via ORAL
  Filled 2023-11-28: qty 1

## 2023-11-28 NOTE — ED Notes (Signed)
Pt still in MRI at this time

## 2023-11-28 NOTE — ED Triage Notes (Addendum)
 Pt. Stated. Ive had left leg pain for 3 weeks.I f ell a month ago and the inner and outer part of my leg hurts at the top. [facility] told me to come here for a scan.

## 2023-11-28 NOTE — ED Provider Notes (Signed)
 [facility] AT [facility] Provider Note   CSN: [id number] Arrival date & time: 11/28/23  9044     Patient presents with: Leg Pain, Fall, and Hip Pain   [name] is a 73 y.o. female.   73 year old female presenting to the emergency department for evaluation of her back and left hip pain.  Patient reports a remote fall a month ago.  She reports severe left hip and back pain over the last month.  She has not been seen up until today.  She continues to bear weight, however it is been difficult for her to get around and perform her activities of daily living.  She denies any lower extremity weakness, numbness or tingling in her lower extremities, saddle anesthesia, or changes with urination.   Leg Pain Fall  Hip Pain       Prior to Admission medications   Medication Sig Start Date End Date Taking? Authorizing Provider  acetaminophen  (TYLENOL ) 650 MG CR tablet Take 1,300 mg by mouth daily as needed for pain.    [provider]  albuterol  (PROVENTIL ) (2.5 MG/3ML) 0.083% nebulizer solution Take 3 mLs (2.5 mg total) by nebulization every 6 (six) hours as needed for wheezing or shortness of breath. 08/02/21   Vivienne Delon HERO, PA-C  albuterol  (VENTOLIN  HFA) 108 (90 Base) MCG/ACT inhaler Inhale 1-2 puffs into the lungs every 6 (six) hours as needed for wheezing or shortness of breath. 09/14/21   Danton Jon HERO, PA-C  allopurinol  (ZYLOPRIM ) 100 MG tablet Take 100 mg by mouth 2 (two) times daily.    [provider]  anastrozole  (ARIMIDEX ) 1 MG tablet TAKE ONE TABLET BY MOUTH ONCE DAILY 06/18/22   Odean Potts, MD  atorvastatin  (LIPITOR) 20 MG tablet Take 20 mg by mouth daily.    [provider]  DULoxetine  (CYMBALTA ) 30 MG capsule Take 30 mg by mouth daily.    [provider]  feeding supplement, GLUCERNA SHAKE, (GLUCERNA SHAKE) LIQD Take 237 mLs by mouth 2 (two) times daily between meals. 06/02/23   Rosario Leatrice FERNS, MD  fluticasone  (FLONASE ) 50 MCG/ACT nasal spray Place 2 sprays into both nostrils daily. 05/10/21   Newlin, Enobong, MD  gabapentin  (NEURONTIN ) 100 MG capsule Take 100-200 mg by mouth See admin instructions. Take one tablet by mouth in the morning and afternoon, then take 2 tablets every night per patient 03/12/22   [provider]  hydrOXYzine  (ATARAX ) 25 MG tablet Take 50 mg by mouth at bedtime. 05/07/23   [provider]  insulin  glargine (LANTUS ) 100 UNIT/ML Solostar Pen Inject 25 Units into the skin daily with breakfast. 10/07/23   Laurence Locus, DO  linagliptin  (TRADJENTA ) 5 MG TABS tablet Take 1 tablet (5 mg total) by mouth daily. 10/04/23   Ghimire, Donalda HERO, MD  nitroGLYCERIN  (NITROSTAT ) 0.4 MG SL tablet Place 1 tablet (0.4 mg total) under the tongue every 5 (five) minutes as needed for chest pain. 03/15/20   Fleming, Zelda W, NP  pantoprazole  (PROTONIX ) 40 MG tablet TAKE ONE TABLET BY MOUTH EVERY DAY FOR ACID REFLUX Patient taking differently: Take 40 mg by mouth daily. 11/02/21   Fleming, Zelda W, NP  polyethylene glycol (MIRALAX  / GLYCOLAX ) 17 g packet Take 17 g by mouth daily. 06/03/23   Rosario Leatrice FERNS, MD  senna (SENOKOT) 8.6 MG TABS tablet Take 1 tablet (8.6 mg total) by mouth daily as needed for mild constipation or moderate constipation. 06/02/23   Rosario Leatrice FERNS, MD  Allergies: Patient has no known allergies.    Review of Systems  All other systems reviewed and are negative.   Updated Vital Signs BP 136/69   Pulse 86   Temp 98 F (36.7 C) (Oral)   Resp 17   LMP  (LMP Unknown) Comment: tubal ligation  SpO2 100%   Physical Exam Vitals and nursing note reviewed.  HENT:     Head: Atraumatic.     Mouth/Throat:     Mouth: Mucous membranes are moist.  Eyes:     Conjunctiva/sclera: Conjunctivae normal.  Cardiovascular:     Rate and Rhythm: Normal rate.     Pulses: Normal pulses.  Abdominal:     Palpations: Abdomen is soft.   Musculoskeletal:     Cervical back: Normal range of motion.     Thoracic back: No bony tenderness.     Lumbar back: No bony tenderness. Decreased range of motion.     Left hip: Decreased range of motion.  Skin:    General: Skin is warm.  Neurological:     General: No focal deficit present.     Mental Status: She is alert and oriented to person, place, and time.     Sensory: No sensory deficit.     Motor: No weakness.     (all labs ordered are listed, but only abnormal results are displayed) Labs Reviewed - No data to display  EKG: None  Radiology: CT Lumbar Spine Wo Contrast Result Date: 11/28/2023 EXAM: CT OF THE LUMBAR SPINE WITHOUT CONTRAST 11/28/2023 02:15:41 PM TECHNIQUE: CT of the lumbar spine was performed without the administration of intravenous contrast. Multiplanar reformatted images are provided for review. Automated exposure control, iterative reconstruction, and/or weight based adjustment of the mA/kV was utilized to reduce the radiation dose to as low as reasonably achievable. COMPARISON: CT abdomen and pelvis 09/26/2023. CLINICAL HISTORY: Fall 1 month ago, low back pain and left hip pain. FINDINGS: BONES AND ALIGNMENT: 5 lumbar vertebrae. Normal vertebral body heights. No acute fracture or suspicious bone lesion. Normal alignment. DEGENERATIVE CHANGES: Diffuse lumbar spondylosis with largely preserved disc heights. Widespread, advanced facet hypertrophy, particularly severe on the left at L2-L3 and L5-S1 and bilaterally at L3-L4 and L4-L5. Disc bulging and posterior element hypertrophy result in severe spinal stenosis at L3-L4 and L4-L5 and moderate spinal and asymmetric left lateral recess stenosis at L2-3. Severe bilateral neural foraminal stenosis at L4-L5. Moderate neural foraminal stenosis on the left at L2-L3 and L5-S1 and bilaterally at L3-L4. SOFT TISSUES: Extensive abdominal aortic atherosclerosis without aneurysm. Asymmetric left renal atrophy. No acute  abnormality. IMPRESSION: 1. No acute lumbar spine fracture. 2. Diffuse disc and advanced facet degeneration with severe spinal stenosis at L3-L4 and L4-L5 and moderate spinal stenosis at L2-L3. 3. Severe bilateral neural foraminal stenosis at L4-L5. Electronically signed by: Dasie Hamburg MD 11/28/2023 02:33 PM EDT RP Workstation: [id number]   CT Hip Left Wo Contrast Result Date: 11/28/2023 EXAM: CT OF THE LEFT HIP WITHOUT IV CONTRAST 11/28/2023 02:15:41 PM TECHNIQUE: CT of the left hip was performed without the administration of intravenous contrast. Multiplanar reformatted images are provided for review. Automated exposure control, iterative reconstruction, and/or weight based adjustment of the mA/kV was utilized to reduce the radiation dose to as low as reasonably achievable. COMPARISON: CT abdomen/pelvis dated 03/29/2023. CLINICAL HISTORY: Low back pain and left hip pain. Fall 1 month ago. FINDINGS: BONES/JOINT: No evidence of fracture or dislocation. Mild-to-moderate superior joint space narrowing of the left hip with foci of mineralization adjacent  to the posterior acetabulum, which could reflect chondrocalcinosis. The left sacroiliac joint is intact with minimal degenerative arthropathy. The pubic symphysis is anatomically aligned with chondrocalcinosis. SOFT TISSUE: No fluid collection. Small fat-containing bilateral inguinal hernias. No enlarged lymph nodes identified in the field of view. Atherosclerotic vascular calcifications. INTRAPELVIC CONTENTS: Redemonstrated calcified uterine fibroid. IMPRESSION: 1. No acute fracture or dislocation. 2. 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 Electronically signed by: Shahmeer Lateef MD 11/28/2023 02:29 PM EDT RP Workstation: [id number]     Procedures   Medications Ordered in the ED  acetaminophen  (TYLENOL ) tablet 650 mg (650 mg Oral Given 11/28/23 1303)                                    Medical Decision Making 73 year old female presenting  for lumbar back discomfort and left hip pain.  She reports the pain is located in her lateral and inner thigh.  She has been in severe discomfort over the last month, however she has not been seen up until today. She has decreased ability to move her left leg secondary to pain during hip flexion.  She is neurovascularly intact distally.  No red flag symptoms.  She does appear in significant discomfort on exam.  No evidence of trauma on exam Differential includes but not limited to hip fracture, lumbar fracture, spinal stenosis, pelvic fracture, and others.  CT of the left hip and lumbar spine was performed> CT shows severe spinal stenosis in her lumbar spine.  No acute fractures in the spine or hip identified.  To further evaluate the severity of her spinal stenosis we have ordered an MRI without contrast. Patient will be signed out pending MRI results to the evening provider.  Amount and/or Complexity of Data Reviewed Radiology: ordered.  Risk OTC drugs.    Final diagnoses:  None    ED Discharge Orders     None          Lasheena Frieze, DO 11/28/23 1455

## 2023-11-28 NOTE — ED Notes (Signed)
 Pt was able to ambulate to the bathroom with her cane with minimal to no assistance

## 2023-11-28 NOTE — Discharge Instructions (Signed)
 While you are in the emergency room, you had pictures taken of your hip and your back.  Overall things look pretty good, but you do have some narrowing of the bones in your lower back.  We call that spinal stenosis.  You may take Tylenol  at home.  I have included a referral for a spine doctor.  Please call their office this week for follow-up appointment.  Return to the emergency room if you develop inability to walk or trouble going to the bathroom.

## 2023-11-28 NOTE — ED Provider Notes (Addendum)
  Physical Exam  BP (!) 156/88   Pulse 71   Temp (!) 97.5 F (36.4 C) (Oral)   Resp 17   LMP  (LMP Unknown) Comment: tubal ligation  SpO2 97%   Physical Exam  Procedures  Procedures  ED Course / MDM    Medical Decision Making Amount and/or Complexity of Data Reviewed Radiology: ordered.  Risk OTC drugs. Prescription drug management.   I, Larnell Gravely, assumed care for this patient.  In brief this is a 73 year old female here today with worsening pain in her back and her hip.  Patient was signed out pending an MRI of her lumbar spine.  Patient's MRI shows no cord compression, does show spinal stenosis.  She has no sensory deficits or observable weakness.  Patient has been able to ambulate to and from the bathroom on her own with her cane.  Will provide her outpatient neurosurgery follow-up.       Gravely Fairy DASEN, DO 11/28/23 2059    Gravely Fairy T, DO 11/28/23 2102

## 2023-12-22 IMAGING — DX DG CHEST 2V
2 series · 2 of 2 positions shown · non-contrast
Comparison: Radiographs 04/12/2021 and 03/14/2021.  CT 03/22/2018.

CLINICAL DATA: Midline chest pain radiating into the back since
last night. History of hypertension and diabetes.

EXAM:
CHEST - 2 VIEW

[chest lat]
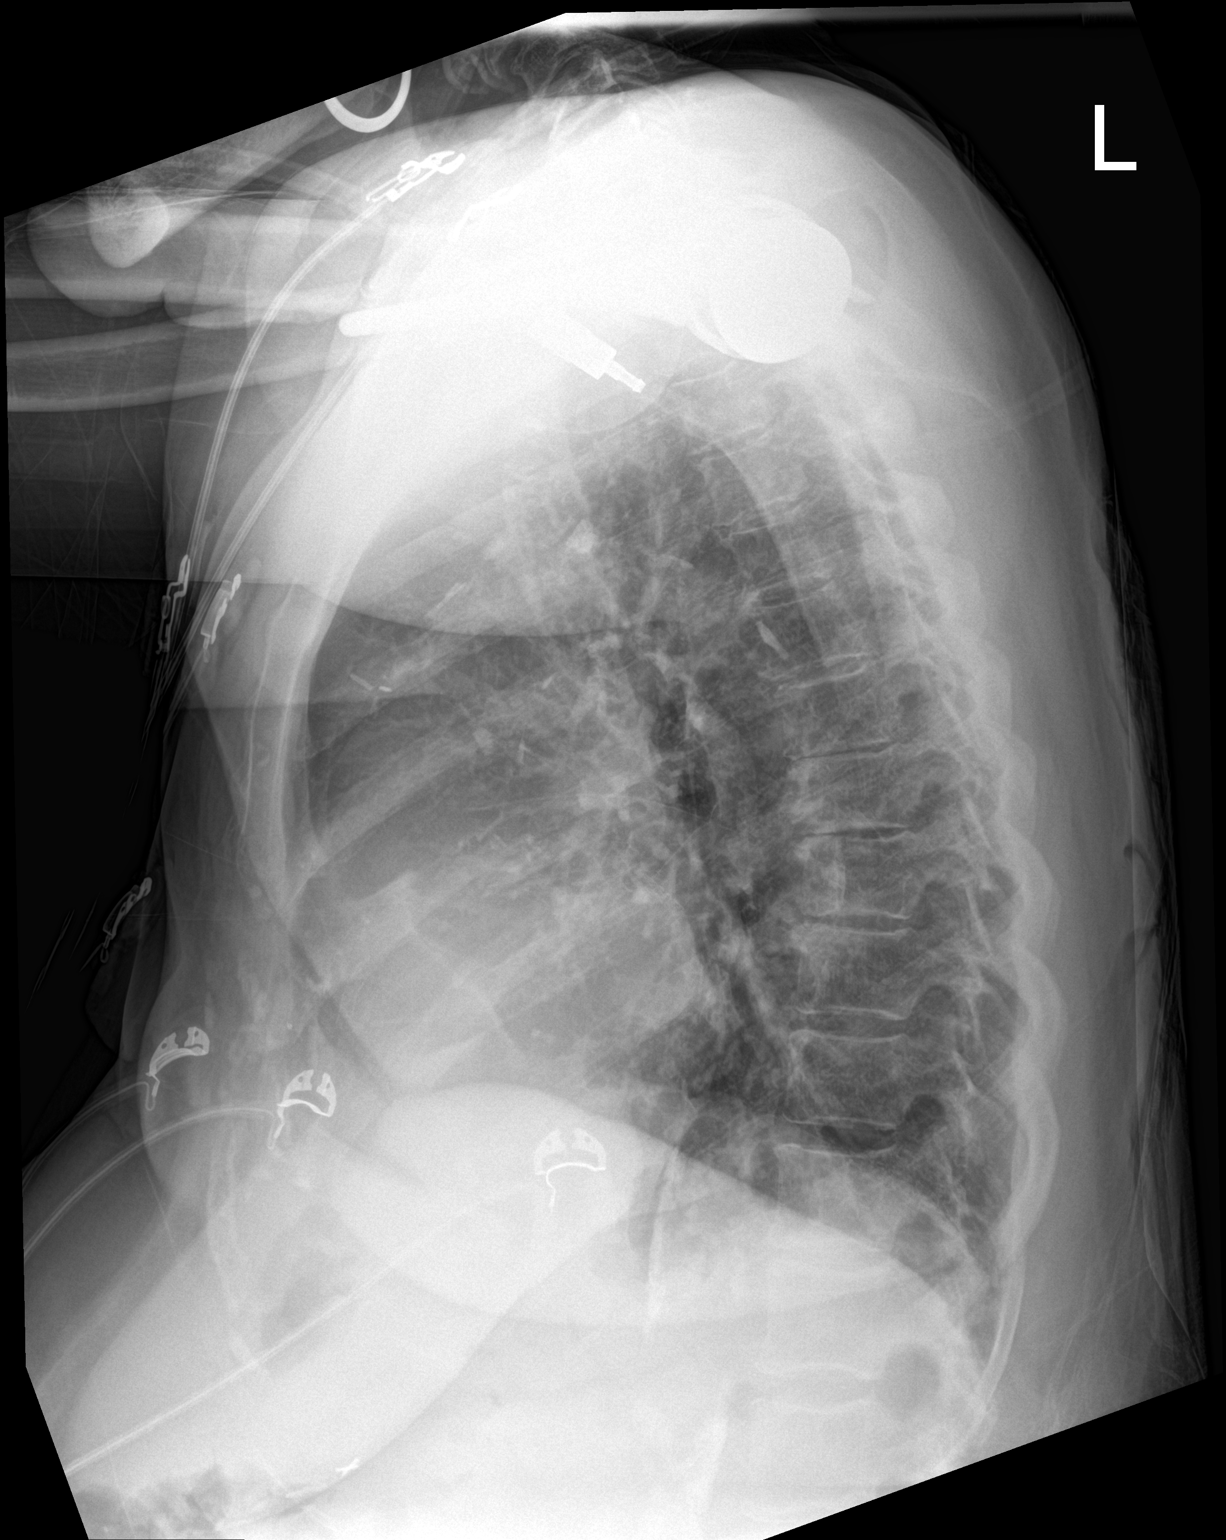

[chest ap]
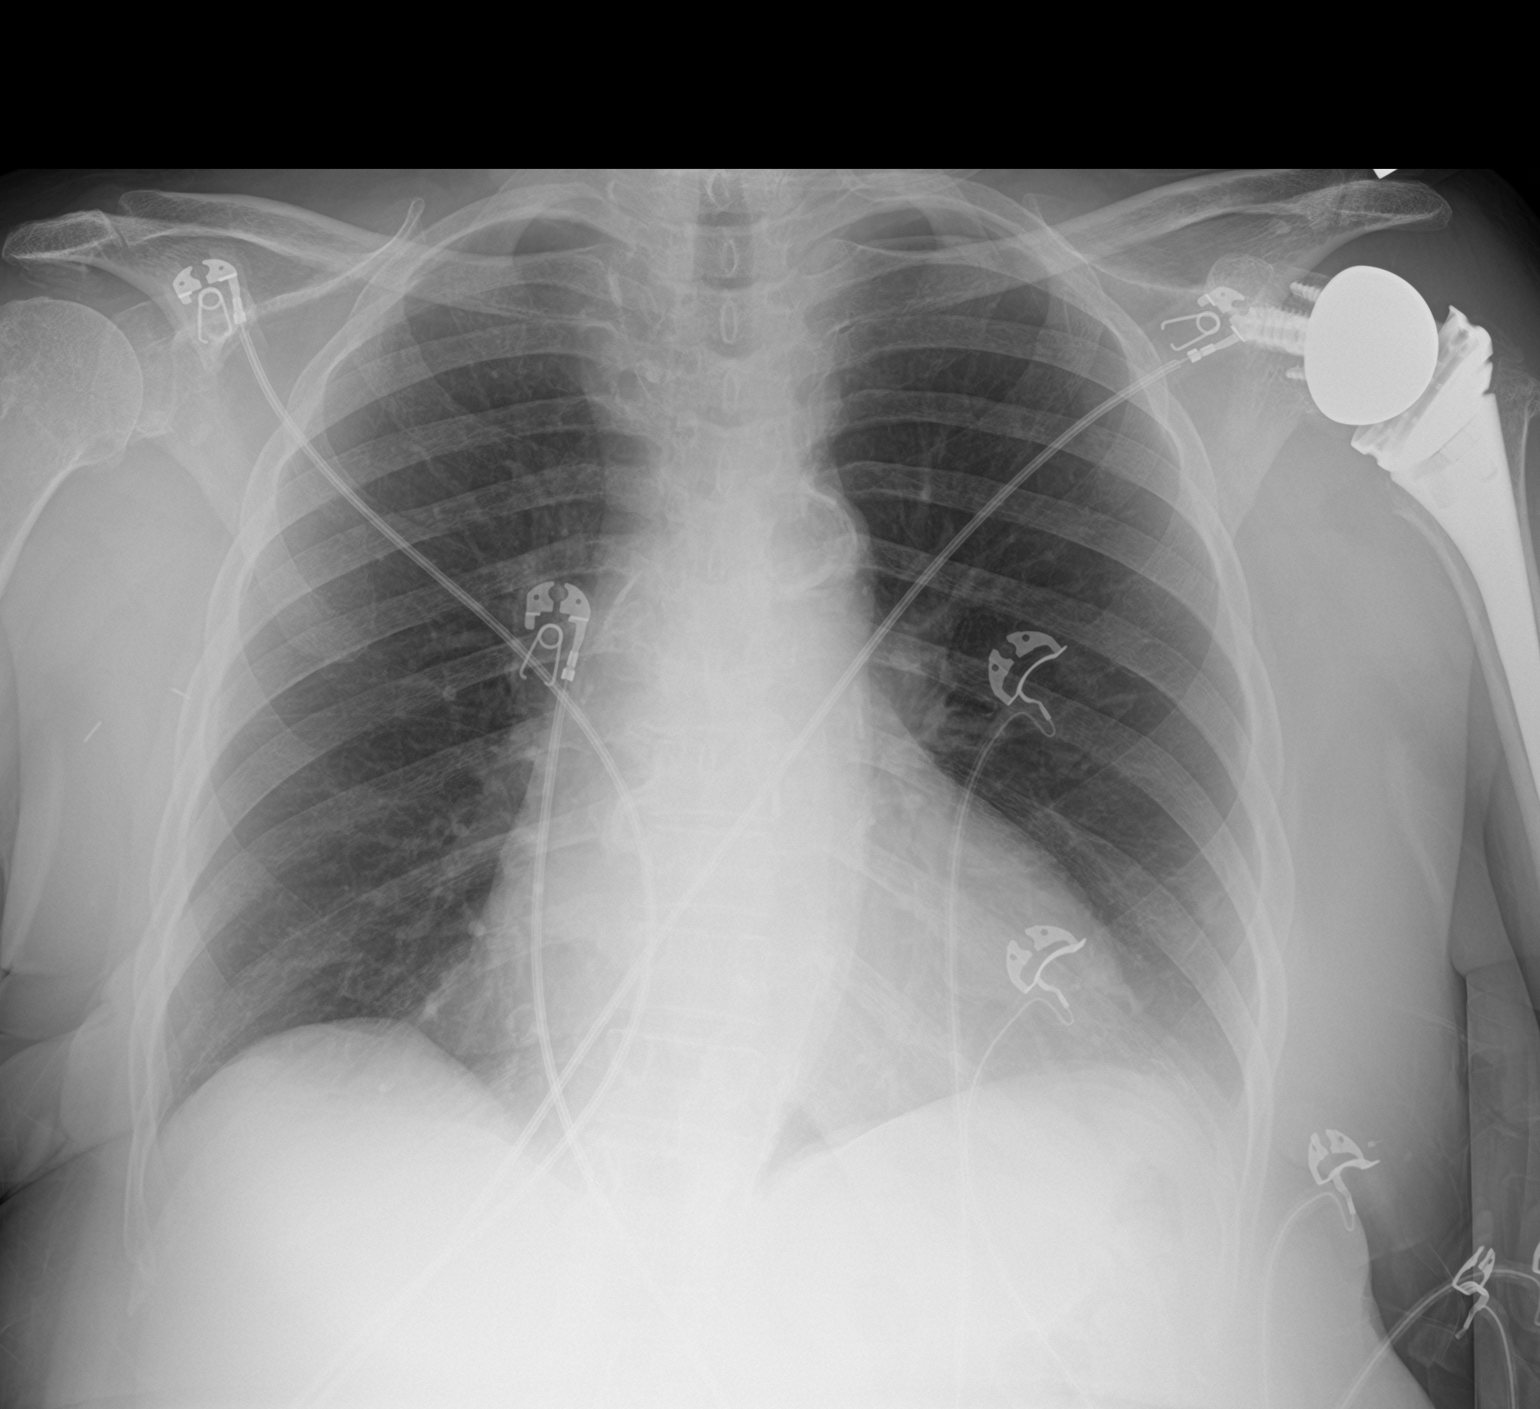

[2 of 2 positions shown; findings below may reference images not displayed]

FINDINGS: 5515 hours. The heart size and mediastinal contours are stable with
aortic atherosclerosis. The lungs are clear. There is no pleural
effusion or pneumothorax. Degenerative changes are present within
the spine. No acute osseous findings are seen status post left
shoulder reverse arthroplasty. There are postsurgical changes in the
right breast or axilla.
IMPRESSION: Stable chest.  No acute cardiopulmonary process.

## 2023-12-22 IMAGING — NM NM PULMONARY PERF PARTICULATE
8 series · 8 of 8 positions shown · non-contrast
Comparison: Chest radiograph done earlier today

CLINICAL DATA: Chest pain

EXAM:
NUCLEAR MEDICINE PERFUSION LUNG SCAN
TECHNIQUE: Perfusion images were obtained in multiple projections after
intravenous injection of radiopharmaceutical.
Ventilation scans intentionally deferred if perfusion scan and chest
x-ray adequate for interpretation during COVID 19 epidemic.
RADIOPHARMACEUTICALS:  Four mCi Qc-SSm MAA IV

[Series 1: ant/post perf · 4.14mm/px · 1 of 1 slices shown (1 of 2)]
[im 1/1]
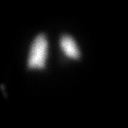

[Series 1: ant/post perf · 4.14mm/px · 1 of 1 slices shown (2 of 2)]
[im 1/1]
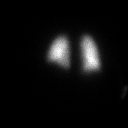

[Series 2: lao/rpo perf · 4.14mm/px · 1 of 1 slices shown (1 of 2)]
[im 1/1]
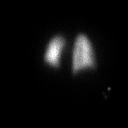

[Series 2: lao/rpo perf · 4.14mm/px · 1 of 1 slices shown (2 of 2)]
[im 1/1]
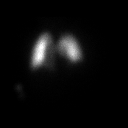

[Series 3: lpo/rao perf · 4.14mm/px · 1 of 1 slices shown (1 of 2)]
[im 1/1]
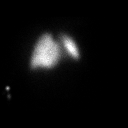

[Series 3: lpo/rao perf · 4.14mm/px · 1 of 1 slices shown (2 of 2)]
[im 1/1]
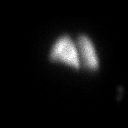

[Series 4: lt lat/rt lat perf · 4.14mm/px · 1 of 1 slices shown (1 of 2)]
[im 1/1]
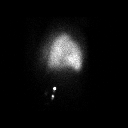

[Series 4: lt lat/rt lat perf · 4.14mm/px · 1 of 1 slices shown (2 of 2)]
[im 1/1]
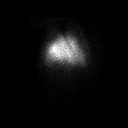

[8 of 8 positions shown; findings below may reference images not displayed]

FINDINGS: There are no wedge-shaped perfusion defects in both lungs.
IMPRESSION: Normal perfusion lung scan. There are no discrete wedge-shaped
perfusion defects.

## 2023-12-24 ENCOUNTER — Other Ambulatory Visit: Payer: Self-pay | Admitting: Family

## 2023-12-24 DIAGNOSIS — F0394 Unspecified dementia, unspecified severity, with anxiety: Secondary | ICD-10-CM

## 2024-01-08 ENCOUNTER — Inpatient Hospital Stay
Admission: RE | Admit: 2024-01-08 | Discharge: 2024-01-08 | Disposition: A | Source: Ambulatory Visit | Attending: Family | Admitting: Family

## 2024-01-08 DIAGNOSIS — F0394 Unspecified dementia, unspecified severity, with anxiety: Secondary | ICD-10-CM

## 2024-01-26 IMAGING — DX DG CHEST 2V
2 series · 2 of 2 positions shown · non-contrast
Comparison: April 12, 21.

CLINICAL DATA: sob

EXAM:
CHEST - 2 VIEW

[chest lat]
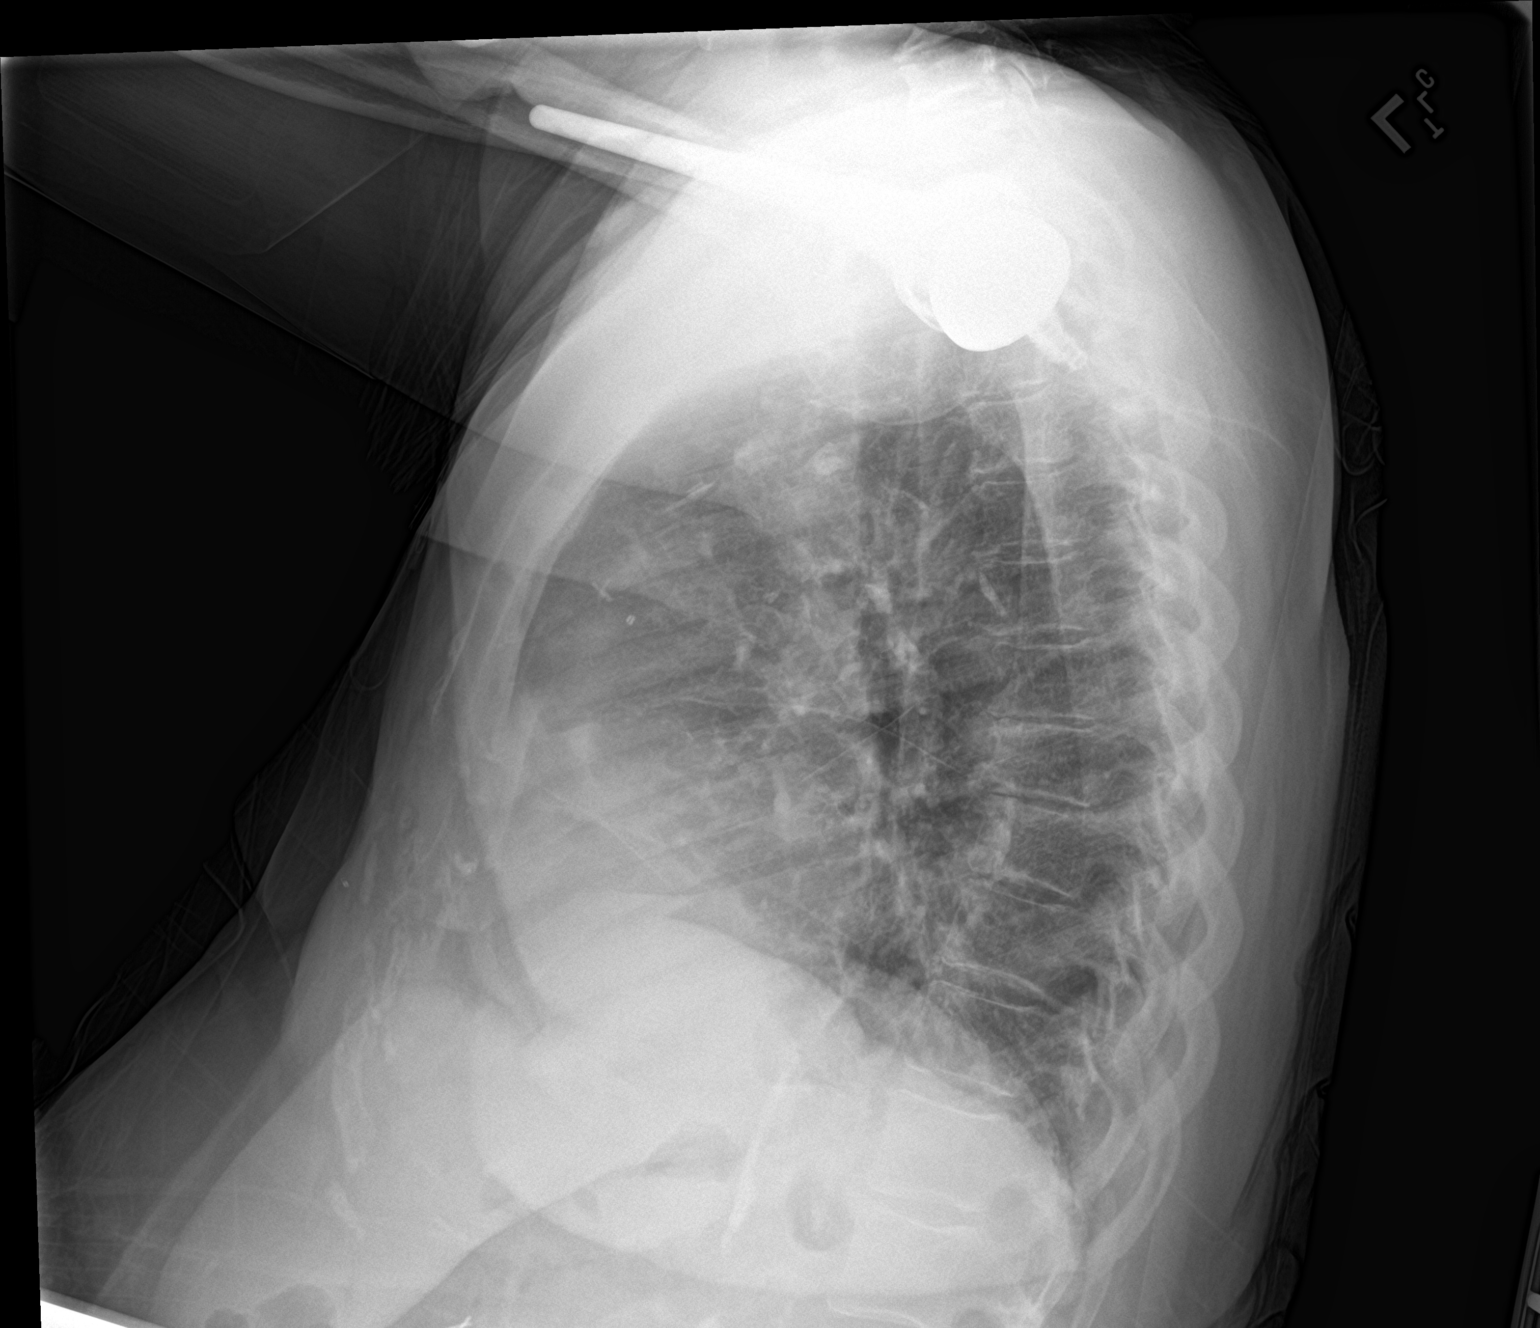

[chest ap]
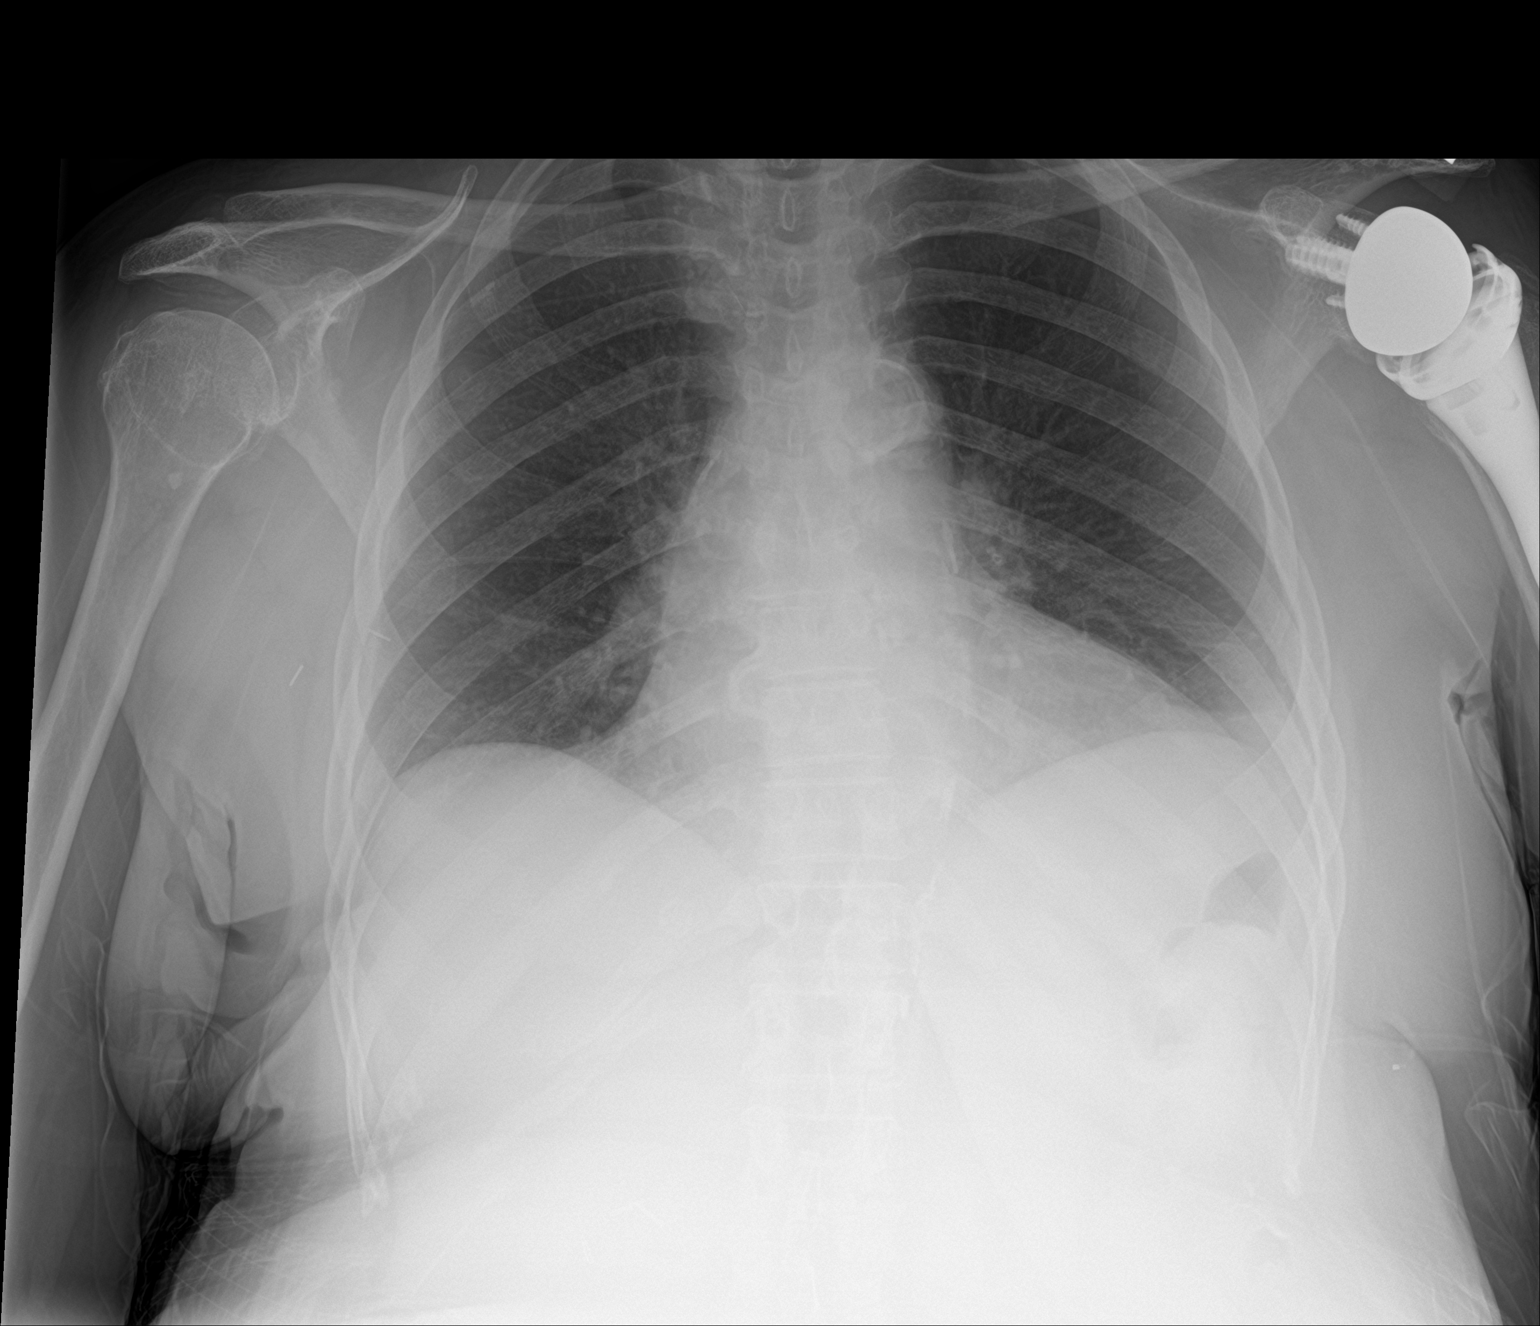

[2 of 2 positions shown; findings below may reference images not displayed]

FINDINGS: Mild lateral left basilar opacity. No visible pneumothorax. No
definite pleural effusion. Cardiomediastinal silhouette is similar.
Partially imaged left reverse shoulder arthroplasty.
IMPRESSION: Mild lateral left basilar opacity, which could represent atelectasis
and/or pneumonia.

## 2024-02-20 ENCOUNTER — Telehealth: Payer: Self-pay

## 2024-02-20 NOTE — Telephone Encounter (Signed)
 Copied from CRM #[id number]. Topic: Clinical - Order For Equipment >> Feb 20, 2024  9:40 AM Rozanna MATSU wrote: Reason for CRM: LaConstance with [facility] calling to see if pt can get a POC due to her having COPD. [phone] is the contact number for [facility] ask for CMA if need to speak with anyone. Advise that pt has not been her in a while and will be considered as a new pt, stated she will have pt call us    I called [facility] and was told that Therisa, CMA is currently with a pt but they would send a note back to have her call me personally. Pt would need to be made as a new pt and did previously see Dr Theophilus.

## 2024-03-05 NOTE — Telephone Encounter (Signed)
 I do not see a message where patient called back to schedule an OV as a new patient.  We have not seen this patient since 2019.  She will need an OV to have us  send in order for POC.

## 2024-03-06 NOTE — Telephone Encounter (Signed)
 Pt has appt with Dr Annella on 2/17

## 2024-03-24 ENCOUNTER — Ambulatory Visit: Admitting: Pulmonary Disease

## 2024-05-07 ENCOUNTER — Other Ambulatory Visit

## 2024-05-21 ENCOUNTER — Telehealth: Admitting: Hematology and Oncology
